# Patient Record
Sex: Male | Born: 1965 | Race: Black or African American | Hispanic: No | Marital: Single | Smoking: Former smoker
Health system: Southern US, Community
[De-identification: ages and names within clinical notes are randomized; demographics above are authoritative.]

## PROBLEM LIST (undated history)

## (undated) DIAGNOSIS — K625 Hemorrhage of anus and rectum: Secondary | ICD-10-CM

## (undated) DIAGNOSIS — E119 Type 2 diabetes mellitus without complications: Secondary | ICD-10-CM

## (undated) DIAGNOSIS — I509 Heart failure, unspecified: Secondary | ICD-10-CM

## (undated) DIAGNOSIS — J9610 Chronic respiratory failure, unspecified whether with hypoxia or hypercapnia: Secondary | ICD-10-CM

## (undated) DIAGNOSIS — I10 Essential (primary) hypertension: Secondary | ICD-10-CM

## (undated) DIAGNOSIS — I2699 Other pulmonary embolism without acute cor pulmonale: Secondary | ICD-10-CM

## (undated) DIAGNOSIS — D649 Anemia, unspecified: Secondary | ICD-10-CM

## (undated) DIAGNOSIS — R0600 Dyspnea, unspecified: Secondary | ICD-10-CM

## (undated) DIAGNOSIS — E669 Obesity, unspecified: Secondary | ICD-10-CM

## (undated) DIAGNOSIS — G4730 Sleep apnea, unspecified: Secondary | ICD-10-CM

## (undated) DIAGNOSIS — I4891 Unspecified atrial fibrillation: Secondary | ICD-10-CM

## (undated) HISTORY — DX: Unspecified atrial fibrillation: I48.91

## (undated) HISTORY — PX: NO PAST SURGERIES: SHX2092

---
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
## 1998-06-04 ENCOUNTER — Emergency Department (HOSPITAL_COMMUNITY): Admission: EM | Admit: 1998-06-04 | Discharge: 1998-06-04 | Payer: Self-pay | Admitting: Emergency Medicine

## 2010-02-02 ENCOUNTER — Emergency Department: Payer: Self-pay | Admitting: Emergency Medicine

## 2010-12-15 ENCOUNTER — Inpatient Hospital Stay: Payer: Self-pay | Admitting: Internal Medicine

## 2019-01-21 ENCOUNTER — Other Ambulatory Visit: Payer: Self-pay

## 2019-01-21 ENCOUNTER — Emergency Department (HOSPITAL_COMMUNITY)

## 2019-01-21 ENCOUNTER — Encounter (HOSPITAL_COMMUNITY): Payer: Self-pay | Admitting: Emergency Medicine

## 2019-01-21 ENCOUNTER — Emergency Department (HOSPITAL_BASED_OUTPATIENT_CLINIC_OR_DEPARTMENT_OTHER)

## 2019-01-21 ENCOUNTER — Inpatient Hospital Stay (HOSPITAL_COMMUNITY)
Admission: EM | Admit: 2019-01-21 | Discharge: 2019-01-24 | DRG: 175 | Disposition: A | Discharge status: Home / Self Care | Attending: Family Medicine | Admitting: Family Medicine

## 2019-01-21 DIAGNOSIS — I5032 Chronic diastolic (congestive) heart failure: Secondary | ICD-10-CM

## 2019-01-21 DIAGNOSIS — M7989 Other specified soft tissue disorders: Secondary | ICD-10-CM

## 2019-01-21 DIAGNOSIS — I110 Hypertensive heart disease with heart failure: Secondary | ICD-10-CM | POA: Diagnosis present

## 2019-01-21 DIAGNOSIS — G4730 Sleep apnea, unspecified: Secondary | ICD-10-CM | POA: Diagnosis present

## 2019-01-21 DIAGNOSIS — E119 Type 2 diabetes mellitus without complications: Secondary | ICD-10-CM | POA: Diagnosis present

## 2019-01-21 DIAGNOSIS — R600 Localized edema: Secondary | ICD-10-CM | POA: Diagnosis not present

## 2019-01-21 DIAGNOSIS — I160 Hypertensive urgency: Secondary | ICD-10-CM | POA: Diagnosis present

## 2019-01-21 DIAGNOSIS — I2699 Other pulmonary embolism without acute cor pulmonale: Principal | ICD-10-CM | POA: Diagnosis present

## 2019-01-21 DIAGNOSIS — I10 Essential (primary) hypertension: Secondary | ICD-10-CM | POA: Diagnosis not present

## 2019-01-21 DIAGNOSIS — E1169 Type 2 diabetes mellitus with other specified complication: Secondary | ICD-10-CM | POA: Diagnosis present

## 2019-01-21 DIAGNOSIS — I5033 Acute on chronic diastolic (congestive) heart failure: Secondary | ICD-10-CM | POA: Diagnosis present

## 2019-01-21 DIAGNOSIS — Z6841 Body Mass Index (BMI) 40.0 and over, adult: Secondary | ICD-10-CM

## 2019-01-21 DIAGNOSIS — Z91128 Patient's intentional underdosing of medication regimen for other reason: Secondary | ICD-10-CM

## 2019-01-21 DIAGNOSIS — M79609 Pain in unspecified limb: Secondary | ICD-10-CM

## 2019-01-21 DIAGNOSIS — E669 Obesity, unspecified: Secondary | ICD-10-CM | POA: Diagnosis present

## 2019-01-21 DIAGNOSIS — R0602 Shortness of breath: Secondary | ICD-10-CM | POA: Diagnosis not present

## 2019-01-21 DIAGNOSIS — Z79899 Other long term (current) drug therapy: Secondary | ICD-10-CM

## 2019-01-21 DIAGNOSIS — R2233 Localized swelling, mass and lump, upper limb, bilateral: Secondary | ICD-10-CM | POA: Diagnosis not present

## 2019-01-21 DIAGNOSIS — T45516A Underdosing of anticoagulants, initial encounter: Secondary | ICD-10-CM | POA: Diagnosis present

## 2019-01-21 DIAGNOSIS — E785 Hyperlipidemia, unspecified: Secondary | ICD-10-CM | POA: Diagnosis present

## 2019-01-21 DIAGNOSIS — Z7984 Long term (current) use of oral hypoglycemic drugs: Secondary | ICD-10-CM

## 2019-01-21 DIAGNOSIS — Z7901 Long term (current) use of anticoagulants: Secondary | ICD-10-CM

## 2019-01-21 DIAGNOSIS — Z20828 Contact with and (suspected) exposure to other viral communicable diseases: Secondary | ICD-10-CM | POA: Diagnosis present

## 2019-01-21 DIAGNOSIS — Z87891 Personal history of nicotine dependence: Secondary | ICD-10-CM

## 2019-01-21 DIAGNOSIS — I480 Paroxysmal atrial fibrillation: Secondary | ICD-10-CM | POA: Diagnosis present

## 2019-01-21 HISTORY — DX: Type 2 diabetes mellitus without complications: E11.9

## 2019-01-21 HISTORY — DX: Obesity, unspecified: E66.9

## 2019-01-21 HISTORY — DX: Essential (primary) hypertension: I10

## 2019-01-21 HISTORY — DX: Sleep apnea, unspecified: G47.30

## 2019-01-21 LAB — CBC WITH DIFFERENTIAL/PLATELET
Abs Immature Granulocytes: 0.03 10*3/uL (ref 0.00–0.07)
Basophils Absolute: 0 10*3/uL (ref 0.0–0.1)
Basophils Relative: 0 %
Eosinophils Absolute: 0.5 10*3/uL (ref 0.0–0.5)
Eosinophils Relative: 7 %
HCT: 43.5 % (ref 39.0–52.0)
Hemoglobin: 12.9 g/dL — ABNORMAL LOW (ref 13.0–17.0)
Immature Granulocytes: 0 %
Lymphocytes Relative: 29 %
Lymphs Abs: 1.9 10*3/uL (ref 0.7–4.0)
MCH: 23.9 pg — ABNORMAL LOW (ref 26.0–34.0)
MCHC: 29.7 g/dL — ABNORMAL LOW (ref 30.0–36.0)
MCV: 80.7 fL (ref 80.0–100.0)
Monocytes Absolute: 0.7 10*3/uL (ref 0.1–1.0)
Monocytes Relative: 10 %
Neutro Abs: 3.6 10*3/uL (ref 1.7–7.7)
Neutrophils Relative %: 54 %
Platelets: 346 10*3/uL (ref 150–400)
RBC: 5.39 MIL/uL (ref 4.22–5.81)
RDW: 15.6 % — ABNORMAL HIGH (ref 11.5–15.5)
WBC: 6.7 10*3/uL (ref 4.0–10.5)
nRBC: 0 % (ref 0.0–0.2)

## 2019-01-21 LAB — COMPREHENSIVE METABOLIC PANEL
ALT: 21 U/L (ref 0–44)
AST: 19 U/L (ref 15–41)
Albumin: 3.4 g/dL — ABNORMAL LOW (ref 3.5–5.0)
Alkaline Phosphatase: 53 U/L (ref 38–126)
Anion gap: 10 (ref 5–15)
BUN: 9 mg/dL (ref 6–20)
CO2: 27 mmol/L (ref 22–32)
Calcium: 9 mg/dL (ref 8.9–10.3)
Chloride: 101 mmol/L (ref 98–111)
Creatinine, Ser: 1.03 mg/dL (ref 0.61–1.24)
GFR calc Af Amer: >60 mL/min (ref >60)
GFR calc non Af Amer: >60 mL/min (ref >60)
Glucose, Bld: 99 mg/dL (ref 70–99)
Potassium: 3.7 mmol/L (ref 3.5–5.1)
Sodium: 138 mmol/L (ref 135–145)
Total Bilirubin: 0.5 mg/dL (ref 0.3–1.2)
Total Protein: 7.3 g/dL (ref 6.5–8.1)

## 2019-01-21 LAB — PROTIME-INR
INR: 1.2 (ref 0.8–1.2)
Prothrombin Time: 15.2 seconds (ref 11.4–15.2)

## 2019-01-21 LAB — TROPONIN I
Troponin I: 0.17 ng/mL (ref <0.03)
Troponin I: 0.18 ng/mL (ref <0.03)

## 2019-01-21 LAB — GLUCOSE, CAPILLARY: Glucose-Capillary: 149 mg/dL — ABNORMAL HIGH (ref 70–99)

## 2019-01-21 LAB — BRAIN NATRIURETIC PEPTIDE: B Natriuretic Peptide: 23.2 pg/mL (ref 0.0–100.0)

## 2019-01-21 LAB — SARS CORONAVIRUS 2 BY RT PCR (HOSPITAL ORDER, PERFORMED IN ~~LOC~~ HOSPITAL LAB): SARS Coronavirus 2: NEGATIVE

## 2019-01-21 MED ORDER — ONDANSETRON HCL 4 MG PO TABS: 4.0000 mg | ORAL_TABLET | Freq: Four times a day (QID) | ORAL | Status: DC | PRN

## 2019-01-21 MED ORDER — IOHEXOL 350 MG/ML SOLN
100.0000 mL | Freq: Once | INTRAVENOUS | Status: AC | PRN
  Administered 2019-01-21: 100 mL via INTRAVENOUS

## 2019-01-21 MED ORDER — HEPARIN BOLUS VIA INFUSION
7500.0000 [IU] | Freq: Once | INTRAVENOUS | Status: AC
  Administered 2019-01-21: 21:00:00 7500 [IU] via INTRAVENOUS
  Filled 2019-01-21: qty 7500

## 2019-01-21 MED ORDER — LISINOPRIL 40 MG PO TABS
40.0000 mg | ORAL_TABLET | Freq: Every day | ORAL | Status: DC
  Administered 2019-01-22 – 2019-01-24 (×3): 40 mg via ORAL
  Filled 2019-01-21 (×3): qty 1

## 2019-01-21 MED ORDER — ATORVASTATIN CALCIUM 10 MG PO TABS
20.0000 mg | ORAL_TABLET | Freq: Every day | ORAL | Status: DC
  Administered 2019-01-22 – 2019-01-23 (×3): 20 mg via ORAL
  Filled 2019-01-21 (×3): qty 2

## 2019-01-21 MED ORDER — FUROSEMIDE 80 MG PO TABS
80.0000 mg | ORAL_TABLET | Freq: Three times a day (TID) | ORAL | Status: DC
  Administered 2019-01-22 – 2019-01-24 (×8): 80 mg via ORAL
  Filled 2019-01-21 (×8): qty 1

## 2019-01-21 MED ORDER — PANTOPRAZOLE SODIUM 40 MG PO TBEC
40.0000 mg | DELAYED_RELEASE_TABLET | Freq: Every day | ORAL | Status: DC
  Administered 2019-01-22 – 2019-01-24 (×3): 40 mg via ORAL
  Filled 2019-01-21 (×3): qty 1

## 2019-01-21 MED ORDER — LABETALOL HCL 5 MG/ML IV SOLN
20.0000 mg | Freq: Once | INTRAVENOUS | Status: AC
  Administered 2019-01-21: 19:00:00 20 mg via INTRAVENOUS
  Filled 2019-01-21: qty 4

## 2019-01-21 MED ORDER — ONDANSETRON HCL 4 MG/2ML IJ SOLN: 4.0000 mg | Freq: Four times a day (QID) | INTRAMUSCULAR | Status: DC | PRN

## 2019-01-21 MED ORDER — ACETAMINOPHEN 650 MG RE SUPP: 650.0000 mg | Freq: Four times a day (QID) | RECTAL | Status: DC | PRN

## 2019-01-21 MED ORDER — HYDRALAZINE HCL 20 MG/ML IJ SOLN
5.0000 mg | INTRAMUSCULAR | Status: DC | PRN
  Administered 2019-01-22 (×2): 5 mg via INTRAVENOUS
  Filled 2019-01-21 (×2): qty 1

## 2019-01-21 MED ORDER — SPIRONOLACTONE 25 MG PO TABS
50.0000 mg | ORAL_TABLET | Freq: Every day | ORAL | Status: DC
  Administered 2019-01-22 – 2019-01-24 (×3): 50 mg via ORAL
  Filled 2019-01-21 (×3): qty 2

## 2019-01-21 MED ORDER — INSULIN ASPART 100 UNIT/ML ~~LOC~~ SOLN
0.0000 [IU] | Freq: Three times a day (TID) | SUBCUTANEOUS | Status: DC
  Administered 2019-01-22: 1 [IU] via SUBCUTANEOUS
  Administered 2019-01-22: 2 [IU] via SUBCUTANEOUS

## 2019-01-21 MED ORDER — HEPARIN (PORCINE) 25000 UT/250ML-% IV SOLN
2150.0000 [IU]/h | INTRAVENOUS | Status: DC
  Administered 2019-01-21: 21:00:00 1950 [IU]/h via INTRAVENOUS
  Administered 2019-01-22: 2150 [IU]/h via INTRAVENOUS
  Filled 2019-01-21 (×2): qty 250

## 2019-01-21 MED ORDER — ACETAMINOPHEN 325 MG PO TABS
650.0000 mg | ORAL_TABLET | Freq: Four times a day (QID) | ORAL | Status: DC | PRN
  Administered 2019-01-22: 650 mg via ORAL
  Filled 2019-01-21: qty 2

## 2019-01-21 MED ORDER — AMLODIPINE BESYLATE 10 MG PO TABS
10.0000 mg | ORAL_TABLET | Freq: Every day | ORAL | Status: DC
  Administered 2019-01-22 – 2019-01-24 (×3): 10 mg via ORAL
  Filled 2019-01-21 (×3): qty 1

## 2019-01-21 MED ORDER — LABETALOL HCL 200 MG PO TABS
200.0000 mg | ORAL_TABLET | Freq: Every day | ORAL | Status: DC
  Administered 2019-01-22 – 2019-01-24 (×3): 200 mg via ORAL
  Filled 2019-01-21 (×3): qty 1

## 2019-01-21 NOTE — ED Triage Notes (Addendum)
Pt in with LLE swelling and pain x 2 wks. States pain worse today, still able to ambulate. Extremity darker than norm per pt, +1 pedal pulse present. Hypertensive - 224/119

## 2019-01-21 NOTE — ED Notes (Signed)
Iv attempted at left ac without success.

## 2019-01-21 NOTE — H&P (Signed)
History and Physical    MEYER ARORA UJW:[id number] DOB: Sep 23, 1965 DOA: 01/21/2019  PCP: System, Pcp Not In  Patient coming from: Home.  Chief Complaint: Shortness of breath.  HPI: Benjamin [name] is a 53 y.o. male with history of morbid obesity, diabetes mellitus type 2, hypertension, hyperlipidemia presents to the ER because of increasing shortness of breath and lower extremity edema ongoing for last 2 weeks.  Patient states he was just out of the prison last month and recently has been taking his medications regularly but over the last 1 week he has not taken.  Shortness of breath increased on exertion denies any associated productive cough fever chills or chest pain.  Has noted increasing swelling in the lower extremity and a gained weight.  ED Course: In the ER EKG shows sinus rhythm CT angiogram of the chest done showed bilateral pulmonary embolism with no strain pattern.  Patient INR was subtherapeutic since patient was not taking his Coumadin for last 1 week.  COVID-19 test was negative.  Patient was started on heparin.  Dopplers of the lower extremity of the left side was negative for DVT.  In addition patient's blood pressures found to be markedly elevated for which patient was given IV hydralazine.  Review of Systems: As per HPI, rest all negative.   Past Medical History:  Diagnosis Date   Diabetes mellitus without complication (HCC)    Hypertension    Obesity    Sleep apnea     History reviewed. No pertinent surgical history.   reports that he has quit smoking. He has never used smokeless tobacco. He reports current alcohol use. He reports previous drug use.  No Known Allergies  History reviewed. No pertinent family history.  Prior to Admission medications   Medication Sig Start Date End Date Taking? Authorizing Provider  amLODipine (NORVASC) 10 MG tablet Take 10 mg by mouth daily. 01/03/19  Yes [provider]  atorvastatin (LIPITOR) 20 MG tablet  Take 20 mg by mouth at bedtime. 01/03/19  Yes [provider]  furosemide (LASIX) 80 MG tablet Take 80 mg by mouth 3 (three) times daily. 01/03/19  Yes [provider]  Glycerin-Hypromellose-PEG 400 (VISINE DRY EYE) 0.2-0.2-1 % SOLN Place 1-2 drops into both eyes 2 (two) times a day.   Yes [provider]  labetalol (NORMODYNE) 200 MG tablet Take 200 mg by mouth daily. 01/03/19  Yes [provider]  lisinopril (ZESTRIL) 40 MG tablet Take 40 mg by mouth daily. 01/03/19  Yes [provider]  metFORMIN (GLUCOPHAGE) 500 MG tablet Take 500 mg by mouth 2 (two) times a day. 01/03/19  Yes [provider]  omeprazole (PRILOSEC) 40 MG capsule Take 40 mg by mouth daily before breakfast. 01/03/19  Yes [provider]  spironolactone (ALDACTONE) 50 MG tablet Take 50 mg by mouth daily. 01/03/19  Yes [provider]  Throat Lozenges (COUGH DROPS MT) 1 drop as needed (for throat dryness).   Yes [provider]  warfarin (COUMADIN) 6 MG tablet Take 6 mg by mouth daily after breakfast. 01/10/19  Yes [provider]  warfarin (COUMADIN) 7.5 MG tablet daily after breakfast. 01/03/19  Yes [provider]    Physical Exam: Vitals:   01/21/19 1845 01/21/19 1930 01/21/19 2100 01/21/19 2130  BP: (!) 186/108 (!) 154/100 (!) 173/121 (!) 174/95  Pulse: 89 92 88 83  Resp: 20 (!) 23 (!) 23 (!) 22  Temp:      TempSrc:  SpO2: 91% 95% 92% (!) 82%  Weight:      Height:          Constitutional: Moderately built and nourished. Vitals:   01/21/19 1845 01/21/19 1930 01/21/19 2100 01/21/19 2130  BP: (!) 186/108 (!) 154/100 (!) 173/121 (!) 174/95  Pulse: 89 92 88 83  Resp: 20 (!) 23 (!) 23 (!) 22  Temp:      TempSrc:      SpO2: 91% 95% 92% (!) 82%  Weight:      Height:       Eyes: Anicteric no pallor. ENMT: No discharge from the ears eyes nose and mouth. Neck: No mass felt.  No neck rigidity. Respiratory: No rhonchi or  crepitations. Cardiovascular: S1-S2 heard. Abdomen: Soft nontender bowel tones present. Musculoskeletal: Bilateral lower extremity edema. Skin: No rash. Neurologic: Alert awake oriented to time place and person.  Moves all extremities. Psychiatric: Appears normal.  Normal affect.   Labs on Admission: I have personally reviewed following labs and imaging studies  CBC: Recent Labs  Lab 01/21/19 1704  WBC 6.7  NEUTROABS 3.6  HGB 12.9*  HCT 43.5  MCV 80.7  PLT 346   Basic Metabolic Panel: Recent Labs  Lab 01/21/19 1704  NA 138  K 3.7  CL 101  CO2 27  GLUCOSE 99  BUN 9  CREATININE 1.03  CALCIUM 9.0   GFR: Estimated Creatinine Clearance: 136 mL/min (by C-G formula based on SCr of 1.03 mg/dL). Liver Function Tests: Recent Labs  Lab 01/21/19 1704  AST 19  ALT 21  ALKPHOS 53  BILITOT 0.5  PROT 7.3  ALBUMIN 3.4*   No results for input(s): LIPASE, AMYLASE in the last 168 hours. No results for input(s): AMMONIA in the last 168 hours. Coagulation Profile: Recent Labs  Lab 01/21/19 1704  INR 1.2   Cardiac Enzymes: Recent Labs  Lab 01/21/19 1704  TROPONINI 0.17*   BNP (last 3 results) No results for input(s): PROBNP in the last 8760 hours. HbA1C: No results for input(s): HGBA1C in the last 72 hours. CBG: No results for input(s): GLUCAP in the last 168 hours. Lipid Profile: No results for input(s): CHOL, HDL, LDLCALC, TRIG, CHOLHDL, LDLDIRECT in the last 72 hours. Thyroid Function Tests: No results for input(s): TSH, T4TOTAL, FREET4, T3FREE, THYROIDAB in the last 72 hours. Anemia Panel: No results for input(s): VITAMINB12, FOLATE, FERRITIN, TIBC, IRON, RETICCTPCT in the last 72 hours. Urine analysis: No results found for: COLORURINE, APPEARANCEUR, LABSPEC, PHURINE, GLUCOSEU, HGBUR, BILIRUBINUR, KETONESUR, PROTEINUR, UROBILINOGEN, NITRITE, LEUKOCYTESUR Sepsis Labs: @LABRCNTIP (procalcitonin:4,lacticidven:4) ) Recent Results (from the past 240 hour(s))    SARS Coronavirus 2 (CEPHEID- Performed in [facility] hospital lab), Hosp Order     Status: None   Collection Time: 01/21/19  8:37 PM  Result Value Ref Range Status   SARS Coronavirus 2 NEGATIVE NEGATIVE Final    Comment: (NOTE) If result is NEGATIVE SARS-CoV-2 target nucleic acids are NOT DETECTED. The SARS-CoV-2 RNA is generally detectable in upper and lower  respiratory specimens during the acute phase of infection. The lowest  concentration of SARS-CoV-2 viral copies this assay can detect is 250  copies / mL. A negative result does not preclude SARS-CoV-2 infection  and should not be used as the sole basis for treatment or other  patient management decisions.  A negative result may occur with  improper specimen collection / handling, submission of specimen other  than nasopharyngeal swab, presence of viral mutation(s) within the  areas targeted by this assay,  and inadequate number of viral copies  (<250 copies / mL). A negative result must be combined with clinical  observations, patient history, and epidemiological information. If result is POSITIVE SARS-CoV-2 target nucleic acids are DETECTED. The SARS-CoV-2 RNA is generally detectable in upper and lower  respiratory specimens dur ing the acute phase of infection.  Positive  results are indicative of active infection with SARS-CoV-2.  Clinical  correlation with patient history and other diagnostic information is  necessary to determine patient infection status.  Positive results do  not rule out bacterial infection or co-infection with other viruses. If result is PRESUMPTIVE POSTIVE SARS-CoV-2 nucleic acids MAY BE PRESENT.   A presumptive positive result was obtained on the submitted specimen  and confirmed on repeat testing.  While 2019 novel coronavirus  (SARS-CoV-2) nucleic acids may be present in the submitted sample  additional confirmatory testing may be necessary for epidemiological  and / or clinical management  purposes  to differentiate between  SARS-CoV-2 and other Sarbecovirus currently known to infect humans.  If clinically indicated additional testing with an alternate test  methodology [phone]) is advised. The SARS-CoV-2 RNA is generally  detectable in upper and lower respiratory sp ecimens during the acute  phase of infection. The expected result is Negative. Fact Sheet for Patients:  BoilerBrush.com.cy Fact Sheet for Healthcare Providers: https://pope.com/ This test is not yet approved or cleared by the [place] FDA and has been authorized for detection and/or diagnosis of SARS-CoV-2 by FDA under an Emergency Use Authorization (EUA).  This EUA will remain in effect (meaning this test can be used) for the duration of the COVID-19 declaration under Section 564(b)(1) of the Act, 21 U.S.C. section 360bbb-3(b)(1), unless the authorization is terminated or revoked sooner. Performed at [facility], [address], [place], [place] [address]      Radiological Exams on Admission: Ct Angio Chest Pe W And/or Wo Contrast  Result Date: 01/21/2019 CLINICAL DATA:  Left leg pain and swelling with chest pain, initial encounter EXAM: CT ANGIOGRAPHY CHEST WITH CONTRAST TECHNIQUE: Multidetector CT imaging of the chest was performed using the standard protocol during bolus administration of intravenous contrast. Multiplanar CT image reconstructions and MIPs were obtained to evaluate the vascular anatomy. CONTRAST:  OMNIPAQUE IOHEXOL 350 MG/ML SOLN COMPARISON:  None. FINDINGS: Cardiovascular: Thoracic aorta shows no significant atherosclerotic calcifications or aneurysmal dilatation. No coronary calcifications are seen. Heart is at the upper limits of normal in size. Pulmonary artery demonstrates a normal branching pattern with multiple filling defects in the lower lobes bilaterally consistent with pulmonary emboli. Mediastinum/Nodes: No hilar or  mediastinal adenopathy is noted. The esophagus as visualized is within normal limits. The thoracic inlet is unremarkable. Lungs/Pleura: Lungs are well aerated bilaterally without focal infiltrate or sizable effusion. No parenchymal nodules are seen. Upper Abdomen: Visualized upper abdomen is within normal limits. Musculoskeletal: Bony structures show degenerative change of the thoracic spine. No acute rib abnormality is seen. Surrounding soft tissue structures are within normal limits. Review of the MIP images confirms the above findings. IMPRESSION: Bilateral lower lobe pulmonary emboli. No definitive right heart strain is seen. Electronically Signed   By: Alcide Clever M.D.   On: 01/21/2019 20:08   Dg Chest Portable 1 View  Result Date: 01/21/2019 CLINICAL DATA:  Shortness of breath EXAM: PORTABLE CHEST 1 VIEW COMPARISON:  12/15/2010 FINDINGS: Cardiac silhouette is significantly enlarged. There is no pneumothorax. No large pleural effusion. Segmental atelectasis is noted at the lung bases. There is some  developing volume overload. IMPRESSION: Cardiomegaly with findings concerning for developing volume overload/pulmonary edema. Electronically Signed   By: Katherine Mantle M.D.   On: 01/21/2019 17:02   Vas [place] Lower Extremity Venous (dvt) (only [facility])  Result Date: 01/21/2019  Lower Venous Study Indications: Pain.  Limitations: Body habitus and Very limited visualization due to depth of vessels/ body habitus. Performing Technologist: Jeb Levering RDMS, RVT  Examination Guidelines: A complete evaluation includes B-mode imaging, spectral Doppler, color Doppler, and power Doppler as needed of all accessible portions of each vessel. Bilateral testing is considered an integral part of a complete examination. Limited examinations for reoccurring indications may be performed as noted.  +---------+---------------+---------+-----------+----------+--------------+  LEFT       Compressibility Phasicity Spontaneity Properties Summary         +---------+---------------+---------+-----------+----------+--------------+  CFV                                                        Not visualized  +---------+---------------+---------+-----------+----------+--------------+  SFJ                                                        Not visualized  +---------+---------------+---------+-----------+----------+--------------+  FV Prox                                                    Not visualized  +---------+---------------+---------+-----------+----------+--------------+  FV Mid                                                     Not visualized  +---------+---------------+---------+-----------+----------+--------------+  FV Distal                                                  Not visualized  +---------+---------------+---------+-----------+----------+--------------+  POP       Full            Yes       Yes                                    +---------+---------------+---------+-----------+----------+--------------+  PTV       Full                                                             +---------+---------------+---------+-----------+----------+--------------+  PERO  Not visualized  +---------+---------------+---------+-----------+----------+--------------+     Summary: Left: Very limited, mostly inconclusive study of the left lower extremity. No DVT noted in areas visualized.  *See table(s) above for measurements and observations.    Preliminary     EKG: Independently reviewed.  Normal sinus rhythm.  Assessment/Plan Principal Problem:   Acute pulmonary embolism (HCC) Active Problems:   Hypertensive urgency   Diabetes mellitus type 2 in obese (HCC)    1. Acute bilateral pulmonary embolism with no strain pattern -patient states he has not ambulatory as usual for the last 2 to 3 weeks.  Patient is placed on heparin.  He  usually takes Coumadin for his A. fib.  Has been subtherapeutic.  When the hospital pharmacy to dose.  Check cardiac markers 2D echo. 2. Hypertensive urgency likely contributing to patient's symptoms for which I have placed patient on PRN IV hydralazine continue labetalol lisinopril and amlodipine. 3. Patient is on large dose of Lasix likely could be from diastolic CHF follow 2D echo results.  Continue Lasix dose.  Follow intake output.  Continue spironolactone. 4. Diabetes mellitus type 2 we will keep patient on sliding scale coverage. 5. History of sleep apnea per the chart. 6. Hyperlipidemia on statin.   DVT prophylaxis: Heparin. Code Status: Full code. Family Communication: Discussed with patient. Disposition Plan: Home. Consults called: None. Admission status: Observation.   Eduard Clos MD [facility] Pager [phone].  If 7PM-7AM, please contact night-coverage [email or web address] Password [email or web address]  01/21/2019, 10:29 PM

## 2019-01-21 NOTE — ED Notes (Signed)
MD notified of elevated troponin 0.17

## 2019-01-21 NOTE — ED Notes (Signed)
ED TO INPATIENT HANDOFF REPORT  ED Nurse Name and Phone #: Lupe Carney [phone]  S Name/Age/Gender Benjamin [name] 53 y.o. male Room/Bed: 023C/023C  Code Status   Code Status: Full Code  Home/SNF/Other Home Patient oriented to: self, place, time and situation Is this baseline? Yes   Triage Complete: Triage complete  Chief Complaint left side pain  Triage Note Pt in with LLE swelling and pain x 2 wks. States pain worse today, still able to ambulate. Extremity darker than norm per pt, +1 pedal pulse present. Hypertensive - 224/119   Allergies No Known Allergies  Level of Care/Admitting Diagnosis ED Disposition    ED Disposition Condition Comment   Admit  Hospital Area: [facility] [[id number]]  Level of Care: Telemetry Medical [104]  I expect the patient will be discharged within 24 hours: No (not a candidate for 5C-Observation unit)  Covid Evaluation: N/A  Diagnosis: Acute pulmonary embolism [facility]) [[id number]]  Admitting Physician: Eduard Clos [phone]  Attending Physician: Eduard Clos Florian.Pax  PT Class (Do Not Modify): Observation [104]  PT Acc Code (Do Not Modify): Observation [10022]       B Medical/Surgery History Past Medical History:  Diagnosis Date  . Diabetes mellitus without complication (HCC)   . Hypertension   . Obesity   . Sleep apnea    History reviewed. No pertinent surgical history.   A IV Location/Drains/Wounds Patient Lines/Drains/Airways Status   Active Line/Drains/Airways    Name:   Placement date:   Placement time:   Site:   Days:   Peripheral IV 01/21/19 Right Hand   01/21/19    1719    Hand   less than 1   Peripheral IV 01/21/19 Right Antecubital   01/21/19    1934    Antecubital   less than 1          Intake/Output Last 24 hours No intake or output data in the 24 hours ending 01/21/19 2254  Labs/Imaging Results for orders placed or performed during the hospital encounter of 01/21/19 (from the past 48  hour(s))  CBC with Differential     Status: Abnormal   Collection Time: 01/21/19  5:04 PM  Result Value Ref Range   WBC 6.7 4.0 - 10.5 K/uL   RBC 5.39 4.22 - 5.81 MIL/uL   Hemoglobin 12.9 (L) 13.0 - 17.0 g/dL   HCT 47.8 29.5 - 62.1 %   MCV 80.7 80.0 - 100.0 fL   MCH 23.9 (L) 26.0 - 34.0 pg   MCHC 29.7 (L) 30.0 - 36.0 g/dL   RDW 30.8 (H) 65.7 - 84.6 %   Platelets 346 150 - 400 K/uL   nRBC 0.0 0.0 - 0.2 %   Neutrophils Relative % 54 %   Neutro Abs 3.6 1.7 - 7.7 K/uL   Lymphocytes Relative 29 %   Lymphs Abs 1.9 0.7 - 4.0 K/uL   Monocytes Relative 10 %   Monocytes Absolute 0.7 0.1 - 1.0 K/uL   Eosinophils Relative 7 %   Eosinophils Absolute 0.5 0.0 - 0.5 K/uL   Basophils Relative 0 %   Basophils Absolute 0.0 0.0 - 0.1 K/uL   Immature Granulocytes 0 %   Abs Immature Granulocytes 0.03 0.00 - 0.07 K/uL    Comment: Performed at [facility], [address], [place], [place] [address]  Comprehensive metabolic panel     Status: Abnormal   Collection Time: 01/21/19  5:04 PM  Result Value Ref Range   Sodium  138 135 - 145 mmol/L   Potassium 3.7 3.5 - 5.1 mmol/L   Chloride 101 98 - 111 mmol/L   CO2 27 22 - 32 mmol/L   Glucose, Bld 99 70 - 99 mg/dL   BUN 9 6 - 20 mg/dL   Creatinine, Ser 5.91 0.61 - 1.24 mg/dL   Calcium 9.0 8.9 - 63.8 mg/dL   Total Protein 7.3 6.5 - 8.1 g/dL   Albumin 3.4 (L) 3.5 - 5.0 g/dL   AST 19 15 - 41 U/L   ALT 21 0 - 44 U/L   Alkaline Phosphatase 53 38 - 126 U/L   Total Bilirubin 0.5 0.3 - 1.2 mg/dL   GFR calc non Af Amer >60 >60 mL/min   GFR calc Af Amer >60 >60 mL/min   Anion gap 10 5 - 15    Comment: Performed at [facility], [address], [place], [place] [address]  Protime-INR     Status: None   Collection Time: 01/21/19  5:04 PM  Result Value Ref Range   Prothrombin Time 15.2 11.4 - 15.2 seconds   INR 1.2 0.8 - 1.2    Comment: (NOTE) INR goal varies based on device and disease states. Performed at [facility], [address], [place], [place] [address]   Troponin I - ONCE - STAT     Status: Abnormal   Collection Time: 01/21/19  5:04 PM  Result Value Ref Range   Troponin I 0.17 (HH) <0.03 ng/mL    Comment: CRITICAL RESULT CALLED TO, READ BACK BY AND VERIFIED WITH: M.GAGE RN 1806 01/21/2019 MCCORMICK K Performed at [facility], [address], [place], [place] [address]   Brain natriuretic peptide     Status: None   Collection Time: 01/21/19  5:04 PM  Result Value Ref Range   B Natriuretic Peptide 23.2 0.0 - 100.0 pg/mL    Comment: Performed at [facility], [address], [place], [place] [address]  SARS Coronavirus 2 (CEPHEID- Performed in [facility] hospital lab), Hosp Order     Status: None   Collection Time: 01/21/19  8:37 PM  Result Value Ref Range   SARS Coronavirus 2 NEGATIVE NEGATIVE    Comment: (NOTE) If result is NEGATIVE SARS-CoV-2 target nucleic acids are NOT DETECTED. The SARS-CoV-2 RNA is generally detectable in upper and lower  respiratory specimens during the acute phase of infection. The lowest  concentration of SARS-CoV-2 viral copies this assay can detect is 250  copies / mL. A negative result does not preclude SARS-CoV-2 infection  and should not be used as the sole basis for treatment or other  patient management decisions.  A negative result may occur with  improper specimen collection / handling, submission of specimen other  than nasopharyngeal swab, presence of viral mutation(s) within the  areas targeted by this assay, and inadequate number of viral copies  (<250 copies / mL). A negative result must be combined with clinical  observations, patient history, and epidemiological information. If result is POSITIVE SARS-CoV-2 target nucleic acids are DETECTED. The SARS-CoV-2 RNA is generally detectable in upper and lower  respiratory specimens dur ing the acute phase of infection.  Positive  results are indicative of active infection with SARS-CoV-2.  Clinical   correlation with patient history and other diagnostic information is  necessary to determine patient infection status.  Positive results do  not rule out bacterial infection or co-infection with other viruses. If result is PRESUMPTIVE  POSTIVE SARS-CoV-2 nucleic acids MAY BE PRESENT.   A presumptive positive result was obtained on the submitted specimen  and confirmed on repeat testing.  While 2019 novel coronavirus  (SARS-CoV-2) nucleic acids may be present in the submitted sample  additional confirmatory testing may be necessary for epidemiological  and / or clinical management purposes  to differentiate between  SARS-CoV-2 and other Sarbecovirus currently known to infect humans.  If clinically indicated additional testing with an alternate test  methodology [phone]) is advised. The SARS-CoV-2 RNA is generally  detectable in upper and lower respiratory sp ecimens during the acute  phase of infection. The expected result is Negative. Fact Sheet for Patients:  BoilerBrush.com.cy Fact Sheet for Healthcare Providers: https://pope.com/ This test is not yet approved or cleared by the [place] FDA and has been authorized for detection and/or diagnosis of SARS-CoV-2 by FDA under an Emergency Use Authorization (EUA).  This EUA will remain in effect (meaning this test can be used) for the duration of the COVID-19 declaration under Section 564(b)(1) of the Act, 21 U.S.C. section 360bbb-3(b)(1), unless the authorization is terminated or revoked sooner. Performed at [facility], [address], [place], [place] [address]    Ct Angio Chest Pe W And/or Wo Contrast  Result Date: 01/21/2019 CLINICAL DATA:  Left leg pain and swelling with chest pain, initial encounter EXAM: CT ANGIOGRAPHY CHEST WITH CONTRAST TECHNIQUE: Multidetector CT imaging of the chest was performed using the standard protocol during bolus administration of intravenous  contrast. Multiplanar CT image reconstructions and MIPs were obtained to evaluate the vascular anatomy. CONTRAST:  OMNIPAQUE IOHEXOL 350 MG/ML SOLN COMPARISON:  None. FINDINGS: Cardiovascular: Thoracic aorta shows no significant atherosclerotic calcifications or aneurysmal dilatation. No coronary calcifications are seen. Heart is at the upper limits of normal in size. Pulmonary artery demonstrates a normal branching pattern with multiple filling defects in the lower lobes bilaterally consistent with pulmonary emboli. Mediastinum/Nodes: No hilar or mediastinal adenopathy is noted. The esophagus as visualized is within normal limits. The thoracic inlet is unremarkable. Lungs/Pleura: Lungs are well aerated bilaterally without focal infiltrate or sizable effusion. No parenchymal nodules are seen. Upper Abdomen: Visualized upper abdomen is within normal limits. Musculoskeletal: Bony structures show degenerative change of the thoracic spine. No acute rib abnormality is seen. Surrounding soft tissue structures are within normal limits. Review of the MIP images confirms the above findings. IMPRESSION: Bilateral lower lobe pulmonary emboli. No definitive right heart strain is seen. Electronically Signed   By: Alcide Clever M.D.   On: 01/21/2019 20:08   Dg Chest Portable 1 View  Result Date: 01/21/2019 CLINICAL DATA:  Shortness of breath EXAM: PORTABLE CHEST 1 VIEW COMPARISON:  12/15/2010 FINDINGS: Cardiac silhouette is significantly enlarged. There is no pneumothorax. No large pleural effusion. Segmental atelectasis is noted at the lung bases. There is some developing volume overload. IMPRESSION: Cardiomegaly with findings concerning for developing volume overload/pulmonary edema. Electronically Signed   By: Katherine Mantle M.D.   On: 01/21/2019 17:02   Vas [place] Lower Extremity Venous (dvt) (only Mc & W[facility])  Result Date: 01/21/2019  Lower Venous Study Indications: Pain.  Limitations: Body habitus and Very limited  visualization due to depth of vessels/ body habitus. Performing Technologist: Jeb Levering RDMS, RVT  Examination Guidelines: A complete evaluation includes B-mode imaging, spectral Doppler, color Doppler, and power Doppler as needed of all accessible portions of each vessel. Bilateral testing is considered an integral part of a complete examination. Limited examinations for reoccurring indications  may be performed as noted.  +---------+---------------+---------+-----------+----------+--------------+ LEFT     CompressibilityPhasicitySpontaneityPropertiesSummary        +---------+---------------+---------+-----------+----------+--------------+ CFV                                                   Not visualized +---------+---------------+---------+-----------+----------+--------------+ SFJ                                                   Not visualized +---------+---------------+---------+-----------+----------+--------------+ FV Prox                                               Not visualized +---------+---------------+---------+-----------+----------+--------------+ FV Mid                                                Not visualized +---------+---------------+---------+-----------+----------+--------------+ FV Distal                                             Not visualized +---------+---------------+---------+-----------+----------+--------------+ POP      Full           Yes      Yes                                 +---------+---------------+---------+-----------+----------+--------------+ PTV      Full                                                        +---------+---------------+---------+-----------+----------+--------------+ PERO                                                  Not visualized +---------+---------------+---------+-----------+----------+--------------+     Summary: Left: Very limited, mostly inconclusive study of the left lower  extremity. No DVT noted in areas visualized.  *See table(s) above for measurements and observations.    Preliminary     Pending Labs Unresulted Labs (From admission, onward)    Start     Ordered   01/23/19 0500  Heparin level (unfractionated)  Daily,   R     01/21/19 2018   01/22/19 0500  CBC  Daily,   R     01/21/19 2018   01/22/19 0500  Basic metabolic panel  Tomorrow morning,   R     01/21/19 2228   01/22/19 0500  Hepatic function panel  Tomorrow morning,   R     01/21/19 2228   01/22/19 0500  CBC WITH DIFFERENTIAL  Tomorrow morning,   R  01/21/19 2228   01/22/19 0300  Heparin level (unfractionated)  Once-Timed,   R     01/21/19 2018   01/21/19 2228  Troponin I - Now Then Q6H  Now then every 6 hours,   R     01/21/19 2228   01/21/19 2227  HIV antibody (Routine Testing)  Once,   R     01/21/19 2228          Vitals/Pain Today's Vitals   01/21/19 1930 01/21/19 2100 01/21/19 2130 01/21/19 2230  BP: (!) 154/100 (!) 173/121 (!) 174/95 (!) 134/114  Pulse: 92 88 83 86  Resp: (!) 23 (!) 23 (!) 22 20  Temp:      TempSrc:      SpO2: 95% 92% (!) 82% 92%  Weight:      Height:      PainSc:        Isolation Precautions No active isolations  Medications Medications  heparin ADULT infusion 100 units/mL (25000 units/241mL sodium chloride 0.45%) (1,950 Units/hr Intravenous Transfusing/Transfer 01/21/19 2238)  amLODipine (NORVASC) tablet 10 mg (has no administration in time range)  atorvastatin (LIPITOR) tablet 20 mg (has no administration in time range)  furosemide (LASIX) tablet 80 mg (has no administration in time range)  labetalol (NORMODYNE) tablet 200 mg (has no administration in time range)  lisinopril (ZESTRIL) tablet 40 mg (has no administration in time range)  spironolactone (ALDACTONE) tablet 50 mg (has no administration in time range)  pantoprazole (PROTONIX) EC tablet 40 mg (has no administration in time range)  acetaminophen (TYLENOL) tablet 650 mg (has no  administration in time range)    Or  acetaminophen (TYLENOL) suppository 650 mg (has no administration in time range)  ondansetron (ZOFRAN) tablet 4 mg (has no administration in time range)    Or  ondansetron (ZOFRAN) injection 4 mg (has no administration in time range)  hydrALAZINE (APRESOLINE) injection 5 mg (has no administration in time range)  insulin aspart (novoLOG) injection 0-9 Units (has no administration in time range)  labetalol (NORMODYNE) injection 20 mg (20 mg Intravenous Given 01/21/19 1928)  iohexol (OMNIPAQUE) 350 MG/ML injection 100 mL (100 mLs Intravenous Contrast Given 01/21/19 1946)  heparin bolus via infusion 7,500 Units (7,500 Units Intravenous Bolus from Bag 01/21/19 2033)    Mobility walks Low fall risk   Focused Assessments Cardiac Assessment Handoff:  Cardiac Rhythm: Normal sinus rhythm Lab Results  Component Value Date   TROPONINI 0.17 (HH) 01/21/2019   No results found for: DDIMER Does the Patient currently have chest pain? No     R Recommendations: See Admitting Provider Note  Report given to: Amy  Additional Notes:

## 2019-01-21 NOTE — Progress Notes (Signed)
ANTICOAGULATION CONSULT NOTE - Initial Consult  Pharmacy Consult for heparin Indication: pulmonary embolus  No Known Allergies  Patient Measurements: Height: 5\' 11"  (180.3 cm) Weight: (!) 390 lb (176.9 kg) IBW/kg (Calculated) : 75.3 Heparin Dosing Weight: 113 kg  Vital Signs: Temp: 98.4 F (36.9 C) (05/04 1628) Temp Source: Oral (05/04 1628) BP: 154/100 (05/04 1930) Pulse Rate: 92 (05/04 1930)  Labs: Recent Labs    01/21/19 1704  HGB 12.9*  HCT 43.5  PLT 346  LABPROT 15.2  INR 1.2  CREATININE 1.03  TROPONINI 0.17*    Estimated Creatinine Clearance: 136 mL/min (by C-G formula based on SCr of 1.03 mg/dL).  Assessment: CC/HPI: 53 yo m presenting w LLE swelling x 2 weeks  PMH: HTN DM hx of PE on warfarin, CHF  Anticoag: warfarin pta for hx of PE (last dose 5/4??) Admit INR 1.2  Pulm: BL PE on CTA, no RHS  Heme/Onc: H&H 12.9/43.5, Plt 346  Goal of Therapy:  Heparin level 0.3-0.7 units/ml Monitor platelets by anticoagulation protocol: Yes   Plan:  Heparin bolus 7500 units x 1 Heparin gtt 1900 units/hr Initial HL 0300 Daily HL CBC FU plans for oral [facility]  Isaac Bliss, PharmD, BCPS, BCCCP Clinical Pharmacist [phone]  Please check AMION for all [facility] Pharmacy numbers  01/21/2019 8:21 PM   Bertram Millard 01/21/2019,8:20 PM

## 2019-01-21 NOTE — ED Provider Notes (Signed)
[facility] EMERGENCY DEPARTMENT Provider Note   CSN: [id number] Arrival date & time: 01/21/19  1339    History   Chief Complaint Chief Complaint  Patient presents with  . Leg Swelling  . L leg pain  . Hypertension    HPI Benjamin [name] is a 53 y.o. male.     HPI 53 year old male with a history of diabetes, HTN, obesity, sleep apnea, pulmonary embolism on warfarin, and CHF presents with lower extremity swelling.  Patient reports that he had worsening swelling over the past 2 months.  Swelling is worse in the left lower extremity.  No erythema or fevers.  He reports compliance with his warfarin.  He has had associated shortness of breath with ambulation and exertion.  Denies chest pain.  Of note, patient was recently released from prison in March and has not had follow-up with a doctor since that time.  He has been taking 80 mg of Lasix in the morning and 40 mg in the evening.  He does not think this is improving his symptoms.  No falls or trauma to the leg.  Past Medical History:  Diagnosis Date  . Diabetes mellitus without complication (HCC)   . Hypertension   . Obesity   . Sleep apnea     Patient Active Problem List   Diagnosis Date Noted  . Acute pulmonary embolism (HCC) 01/21/2019  . Hypertensive urgency 01/21/2019  . Diabetes mellitus type 2 in obese (HCC) 01/21/2019    History reviewed. No pertinent surgical history.      Home Medications    Prior to Admission medications   Medication Sig Start Date End Date Taking? Authorizing Provider  amLODipine (NORVASC) 10 MG tablet Take 10 mg by mouth daily. 01/03/19  Yes [provider]  atorvastatin (LIPITOR) 20 MG tablet Take 20 mg by mouth at bedtime. 01/03/19  Yes [provider]  furosemide (LASIX) 80 MG tablet Take 80 mg by mouth 3 (three) times daily. 01/03/19  Yes [provider]  Glycerin-Hypromellose-PEG 400 (VISINE DRY EYE) 0.2-0.2-1 % SOLN Place 1-2 drops into  both eyes 2 (two) times a day.   Yes [provider]  labetalol (NORMODYNE) 200 MG tablet Take 200 mg by mouth daily. 01/03/19  Yes [provider]  lisinopril (ZESTRIL) 40 MG tablet Take 40 mg by mouth daily. 01/03/19  Yes [provider]  metFORMIN (GLUCOPHAGE) 500 MG tablet Take 500 mg by mouth 2 (two) times a day. 01/03/19  Yes [provider]  omeprazole (PRILOSEC) 40 MG capsule Take 40 mg by mouth daily before breakfast. 01/03/19  Yes [provider]  spironolactone (ALDACTONE) 50 MG tablet Take 50 mg by mouth daily. 01/03/19  Yes [provider]  Throat Lozenges (COUGH DROPS MT) 1 drop as needed (for throat dryness).   Yes [provider]  warfarin (COUMADIN) 6 MG tablet Take 6 mg by mouth daily after breakfast. 01/10/19  Yes [provider]  warfarin (COUMADIN) 7.5 MG tablet daily after breakfast. 01/03/19  Yes [provider]    Family History History reviewed. No pertinent family history.  Social History Social History   Tobacco Use  . Smoking status: Former Games developer  . Smokeless tobacco: Never Used  Substance Use Topics  . Alcohol use: Yes  . Drug use: Not Currently     Allergies   Patient has no known allergies.   Review of Systems Review of Systems  Constitutional: Negative for chills and fever.  HENT: Negative  for ear pain and sore throat.   Eyes: Negative for pain and visual disturbance.  Respiratory: Positive for shortness of breath. Negative for cough.   Cardiovascular: Positive for leg swelling. Negative for chest pain and palpitations.  Gastrointestinal: Negative for abdominal pain and vomiting.  Genitourinary: Negative for dysuria and hematuria.  Musculoskeletal: Negative for arthralgias and back pain.  Skin: Negative for color change and rash.  Neurological: Negative for seizures and syncope.  All other systems reviewed and are negative.    Physical Exam Updated Vital Signs BP  (!) 171/133 (BP Location: Left Arm)   Pulse (!) 106   Temp 98 F (36.7 C)   Resp 19   Ht 5\' 11"  (1.803 m)   Wt (!) 191 kg   SpO2 93%   BMI 58.72 kg/m   Physical Exam Vitals signs and nursing note reviewed.  Constitutional:      Appearance: He is well-developed.  HENT:     Head: Normocephalic and atraumatic.  Eyes:     Conjunctiva/sclera: Conjunctivae normal.  Neck:     Musculoskeletal: Neck supple.  Cardiovascular:     Rate and Rhythm: Normal rate and regular rhythm.     Heart sounds: No murmur.  Pulmonary:     Effort: Pulmonary effort is normal. No respiratory distress.     Breath sounds: Normal breath sounds.  Abdominal:     Palpations: Abdomen is soft.     Tenderness: There is no abdominal tenderness.  Musculoskeletal:     Comments: 2+ pitting edema in bilateral lower extremities Edema does appear to be worse in the left calf compared to right, no erythema, no wounds 1+ DP pulse Normal motor and sensory function of the extremity  Skin:    General: Skin is warm and dry.  Neurological:     General: No focal deficit present.     Mental Status: He is alert and oriented to person, place, and time.      ED Treatments / Results  Labs (all labs ordered are listed, but only abnormal results are displayed) Labs Reviewed  CBC WITH DIFFERENTIAL/PLATELET - Abnormal; Notable for the following components:      Result Value   Hemoglobin 12.9 (*)    MCH 23.9 (*)    MCHC 29.7 (*)    RDW 15.6 (*)    All other components within normal limits  COMPREHENSIVE METABOLIC PANEL - Abnormal; Notable for the following components:   Albumin 3.4 (*)    All other components within normal limits  TROPONIN I - Abnormal; Notable for the following components:   Troponin I 0.17 (*)    All other components within normal limits  HEPARIN LEVEL (UNFRACTIONATED) - Abnormal; Notable for the following components:   Heparin Unfractionated 0.23 (*)    All other components within normal limits   BASIC METABOLIC PANEL - Abnormal; Notable for the following components:   Glucose, Bld 147 (*)    All other components within normal limits  CBC WITH DIFFERENTIAL/PLATELET - Abnormal; Notable for the following components:   MCH 24.4 (*)    All other components within normal limits  TROPONIN I - Abnormal; Notable for the following components:   Troponin I 0.18 (*)    All other components within normal limits  TROPONIN I - Abnormal; Notable for the following components:   Troponin I 0.19 (*)    All other components within normal limits  TROPONIN I - Abnormal; Notable for the following components:   Troponin I 0.16 (*)  All other components within normal limits  GLUCOSE, CAPILLARY - Abnormal; Notable for the following components:   Glucose-Capillary 149 (*)    All other components within normal limits  HEPARIN LEVEL (UNFRACTIONATED) - Abnormal; Notable for the following components:   Heparin Unfractionated 0.20 (*)    All other components within normal limits  GLUCOSE, CAPILLARY - Abnormal; Notable for the following components:   Glucose-Capillary 122 (*)    All other components within normal limits  GLUCOSE, CAPILLARY - Abnormal; Notable for the following components:   Glucose-Capillary 175 (*)    All other components within normal limits  GLUCOSE, CAPILLARY - Abnormal; Notable for the following components:   Glucose-Capillary 141 (*)    All other components within normal limits  SARS CORONAVIRUS 2 (HOSPITAL ORDER, PERFORMED IN [facility])  PROTIME-INR  BRAIN NATRIURETIC PEPTIDE  HEPATIC FUNCTION PANEL  CBC  HIV ANTIBODY (ROUTINE TESTING W REFLEX)    EKG EKG Interpretation  Date/Time:  Monday Jan 21 2019 16:29:50 EDT Ventricular Rate:  94 PR Interval:    QRS Duration: 98 QT Interval:  353 QTC Calculation: 442 R Axis:   78 Text Interpretation:  Sinus rhythm Borderline T abnormalities, lateral leads Minimal ST elevation, anterior leads No significant  change since last tracing Confirmed by Benjiman Core [phone]) on 01/21/2019 6:35:14 PM   Radiology Ct Angio Chest Pe W And/or Wo Contrast  Result Date: 01/21/2019 CLINICAL DATA:  Left leg pain and swelling with chest pain, initial encounter EXAM: CT ANGIOGRAPHY CHEST WITH CONTRAST TECHNIQUE: Multidetector CT imaging of the chest was performed using the standard protocol during bolus administration of intravenous contrast. Multiplanar CT image reconstructions and MIPs were obtained to evaluate the vascular anatomy. CONTRAST:  OMNIPAQUE IOHEXOL 350 MG/ML SOLN COMPARISON:  None. FINDINGS: Cardiovascular: Thoracic aorta shows no significant atherosclerotic calcifications or aneurysmal dilatation. No coronary calcifications are seen. Heart is at the upper limits of normal in size. Pulmonary artery demonstrates a normal branching pattern with multiple filling defects in the lower lobes bilaterally consistent with pulmonary emboli. Mediastinum/Nodes: No hilar or mediastinal adenopathy is noted. The esophagus as visualized is within normal limits. The thoracic inlet is unremarkable. Lungs/Pleura: Lungs are well aerated bilaterally without focal infiltrate or sizable effusion. No parenchymal nodules are seen. Upper Abdomen: Visualized upper abdomen is within normal limits. Musculoskeletal: Bony structures show degenerative change of the thoracic spine. No acute rib abnormality is seen. Surrounding soft tissue structures are within normal limits. Review of the MIP images confirms the above findings. IMPRESSION: Bilateral lower lobe pulmonary emboli. No definitive right heart strain is seen. Electronically Signed   By: Alcide Clever M.D.   On: 01/21/2019 20:08   Dg Chest Portable 1 View  Result Date: 01/21/2019 CLINICAL DATA:  Shortness of breath EXAM: PORTABLE CHEST 1 VIEW COMPARISON:  12/15/2010 FINDINGS: Cardiac silhouette is significantly enlarged. There is no pneumothorax. No large pleural effusion.  Segmental atelectasis is noted at the lung bases. There is some developing volume overload. IMPRESSION: Cardiomegaly with findings concerning for developing volume overload/pulmonary edema. Electronically Signed   By: Katherine Mantle M.D.   On: 01/21/2019 17:02   Vas [place] Lower Extremity Venous (dvt) (only [facility])  Result Date: 01/22/2019  Lower Venous Study Indications: Pain.  Limitations: Body habitus and Very limited visualization due to depth of vessels/ body habitus. Performing Technologist: Jeb Levering RDMS, RVT  Examination Guidelines: A complete evaluation includes B-mode imaging, spectral Doppler, color Doppler, and power Doppler as needed of  all accessible portions of each vessel. Bilateral testing is considered an integral part of a complete examination. Limited examinations for reoccurring indications may be performed as noted.  +---------+---------------+---------+-----------+----------+--------------+ LEFT     CompressibilityPhasicitySpontaneityPropertiesSummary        +---------+---------------+---------+-----------+----------+--------------+ CFV                                                   Not visualized +---------+---------------+---------+-----------+----------+--------------+ SFJ                                                   Not visualized +---------+---------------+---------+-----------+----------+--------------+ FV Prox                                               Not visualized +---------+---------------+---------+-----------+----------+--------------+ FV Mid                                                Not visualized +---------+---------------+---------+-----------+----------+--------------+ FV Distal                                             Not visualized +---------+---------------+---------+-----------+----------+--------------+ POP      Full           Yes      Yes                                  +---------+---------------+---------+-----------+----------+--------------+ PTV      Full                                                        +---------+---------------+---------+-----------+----------+--------------+ PERO                                                  Not visualized +---------+---------------+---------+-----------+----------+--------------+     Summary: Left: Very limited, mostly inconclusive study of the left lower extremity. No DVT noted in areas visualized.  *See table(s) above for measurements and observations. Electronically signed by Fabienne Brunsharles Fields MD on 01/22/2019 at 2:36:23 AM.    Final     Procedures Procedures (including critical care time)  Medications Ordered in ED Medications  heparin ADULT infusion 100 units/mL (25000 units/22950mL sodium chloride 0.45%) (2,150 Units/hr Intravenous Handoff 01/22/19 1200)  amLODipine (NORVASC) tablet 10 mg (10 mg Oral Given 01/22/19 0850)  atorvastatin (LIPITOR) tablet 20 mg (20 mg Oral Given 01/22/19 0016)  furosemide (LASIX) tablet 80 mg (80 mg Oral Given 01/22/19 0850)  labetalol (NORMODYNE) tablet 200 mg (200 mg Oral  Given 01/22/19 0849)  lisinopril (ZESTRIL) tablet 40 mg (40 mg Oral Given 01/22/19 0849)  spironolactone (ALDACTONE) tablet 50 mg (50 mg Oral Given 01/22/19 0850)  pantoprazole (PROTONIX) EC tablet 40 mg (40 mg Oral Given 01/22/19 0850)  acetaminophen (TYLENOL) tablet 650 mg (650 mg Oral Given 01/22/19 0906)    Or  acetaminophen (TYLENOL) suppository 650 mg ( Rectal See Alternative 01/22/19 0906)  ondansetron (ZOFRAN) tablet 4 mg (has no administration in time range)    Or  ondansetron (ZOFRAN) injection 4 mg (has no administration in time range)  insulin aspart (novoLOG) injection 0-9 Units (2 Units Subcutaneous Given 01/22/19 0853)  warfarin (COUMADIN) tablet 15 mg (has no administration in time range)  Warfarin - Pharmacist Dosing Inpatient (has no administration in time range)  hydrALAZINE (APRESOLINE) injection  10 mg (has no administration in time range)  labetalol (NORMODYNE) injection 20 mg (20 mg Intravenous Given 01/21/19 1928)  iohexol (OMNIPAQUE) 350 MG/ML injection 100 mL (100 mLs Intravenous Contrast Given 01/21/19 1946)  heparin bolus via infusion 7,500 Units (7,500 Units Intravenous Bolus from Bag 01/21/19 2033)  perflutren lipid microspheres (DEFINITY) IV suspension (4 mLs Intravenous Given 01/22/19 0955)     Initial Impression / Assessment and Plan / ED Course  I have reviewed the triage vital signs and the nursing notes.  Pertinent labs & imaging results that were available during my care of the patient were reviewed by me and considered in my medical decision making (see chart for details).  53 year old male with a history of diabetes, HTN, obesity, sleep apnea, pulmonary embolism on warfarin, and CHF presents with lower extremity swelling.  Patient hypertensive on arrival.  Not tachycardic.  Afebrile.  Exam, patient is morbidly obese with bilateral lower extremity pitting edema worse in the left extremity.  Differential includes PE, DVT, pneumonia, CHF exacerbation.  No fevers or erythema.  No wounds on the leg to suggest cellulitis or abscess.  EKG normal sinus rhythm.  T wave inversions in lead II, lead III, aVF, V5 and V6.  Chest x-ray does show cardiomegaly with findings concerning for developing volume overload/pulmonary edema.  20 mg of IV labetalol given to control blood pressure.  CBC shows no leukocytosis.  Hemoglobin 12.9.  CMP unremarkable.  INR 1.2.  Troponin 0.17.  BNP 23.2.   Ultrasound of lower extremity very limited and inconclusive.  No DVT noted in the areas visualized.  CT PE Study showed bilateral lower lobe pulmonary emboli.  Patient started on heparin and admitted to the hospitalist for further management.  Final Clinical Impressions(s) / ED Diagnoses   Final diagnoses:  None    ED Discharge Orders    None       Vallery Ridge, MD 01/22/19 1154     Benjiman Core, MD 01/22/19 2333

## 2019-01-21 NOTE — ED Notes (Signed)
Patient to CT.

## 2019-01-21 NOTE — ED Notes (Signed)
Vascular at bedside

## 2019-01-22 ENCOUNTER — Observation Stay (HOSPITAL_BASED_OUTPATIENT_CLINIC_OR_DEPARTMENT_OTHER)

## 2019-01-22 DIAGNOSIS — I5033 Acute on chronic diastolic (congestive) heart failure: Secondary | ICD-10-CM

## 2019-01-22 DIAGNOSIS — E785 Hyperlipidemia, unspecified: Secondary | ICD-10-CM | POA: Diagnosis present

## 2019-01-22 DIAGNOSIS — I480 Paroxysmal atrial fibrillation: Secondary | ICD-10-CM | POA: Diagnosis present

## 2019-01-22 DIAGNOSIS — R600 Localized edema: Secondary | ICD-10-CM

## 2019-01-22 DIAGNOSIS — Z7984 Long term (current) use of oral hypoglycemic drugs: Secondary | ICD-10-CM | POA: Diagnosis not present

## 2019-01-22 DIAGNOSIS — G4730 Sleep apnea, unspecified: Secondary | ICD-10-CM | POA: Diagnosis present

## 2019-01-22 DIAGNOSIS — Z79899 Other long term (current) drug therapy: Secondary | ICD-10-CM | POA: Diagnosis not present

## 2019-01-22 DIAGNOSIS — I2699 Other pulmonary embolism without acute cor pulmonale: Secondary | ICD-10-CM

## 2019-01-22 DIAGNOSIS — R0602 Shortness of breath: Secondary | ICD-10-CM | POA: Diagnosis present

## 2019-01-22 DIAGNOSIS — Z6841 Body Mass Index (BMI) 40.0 and over, adult: Secondary | ICD-10-CM | POA: Diagnosis not present

## 2019-01-22 DIAGNOSIS — I160 Hypertensive urgency: Secondary | ICD-10-CM | POA: Diagnosis present

## 2019-01-22 DIAGNOSIS — I5032 Chronic diastolic (congestive) heart failure: Secondary | ICD-10-CM

## 2019-01-22 DIAGNOSIS — Z20828 Contact with and (suspected) exposure to other viral communicable diseases: Secondary | ICD-10-CM | POA: Diagnosis present

## 2019-01-22 DIAGNOSIS — I110 Hypertensive heart disease with heart failure: Secondary | ICD-10-CM | POA: Diagnosis present

## 2019-01-22 DIAGNOSIS — Z87891 Personal history of nicotine dependence: Secondary | ICD-10-CM | POA: Diagnosis not present

## 2019-01-22 DIAGNOSIS — E119 Type 2 diabetes mellitus without complications: Secondary | ICD-10-CM | POA: Diagnosis present

## 2019-01-22 DIAGNOSIS — T45516A Underdosing of anticoagulants, initial encounter: Secondary | ICD-10-CM | POA: Diagnosis present

## 2019-01-22 DIAGNOSIS — Z91128 Patient's intentional underdosing of medication regimen for other reason: Secondary | ICD-10-CM | POA: Diagnosis not present

## 2019-01-22 DIAGNOSIS — Z7901 Long term (current) use of anticoagulants: Secondary | ICD-10-CM | POA: Diagnosis not present

## 2019-01-22 LAB — BASIC METABOLIC PANEL
Anion gap: 10 (ref 5–15)
BUN: 9 mg/dL (ref 6–20)
CO2: 30 mmol/L (ref 22–32)
Calcium: 9.1 mg/dL (ref 8.9–10.3)
Chloride: 98 mmol/L (ref 98–111)
Creatinine, Ser: 1.2 mg/dL (ref 0.61–1.24)
GFR calc Af Amer: >60 mL/min (ref >60)
GFR calc non Af Amer: >60 mL/min (ref >60)
Glucose, Bld: 147 mg/dL — ABNORMAL HIGH (ref 70–99)
Potassium: 3.8 mmol/L (ref 3.5–5.1)
Sodium: 138 mmol/L (ref 135–145)

## 2019-01-22 LAB — CBC WITH DIFFERENTIAL/PLATELET
Abs Immature Granulocytes: 0.02 10*3/uL (ref 0.00–0.07)
Basophils Absolute: 0 10*3/uL (ref 0.0–0.1)
Basophils Relative: 0 %
Eosinophils Absolute: 0.5 10*3/uL (ref 0.0–0.5)
Eosinophils Relative: 6 %
HCT: 43.5 % (ref 39.0–52.0)
Hemoglobin: 13.2 g/dL (ref 13.0–17.0)
Immature Granulocytes: 0 %
Lymphocytes Relative: 32 %
Lymphs Abs: 2.4 10*3/uL (ref 0.7–4.0)
MCH: 24.4 pg — ABNORMAL LOW (ref 26.0–34.0)
MCHC: 30.3 g/dL (ref 30.0–36.0)
MCV: 80.3 fL (ref 80.0–100.0)
Monocytes Absolute: 0.7 10*3/uL (ref 0.1–1.0)
Monocytes Relative: 9 %
Neutro Abs: 4 10*3/uL (ref 1.7–7.7)
Neutrophils Relative %: 53 %
Platelets: 344 10*3/uL (ref 150–400)
RBC: 5.42 MIL/uL (ref 4.22–5.81)
RDW: 15.5 % (ref 11.5–15.5)
WBC: 7.6 10*3/uL (ref 4.0–10.5)
nRBC: 0 % (ref 0.0–0.2)

## 2019-01-22 LAB — GLUCOSE, CAPILLARY
Glucose-Capillary: 122 mg/dL — ABNORMAL HIGH (ref 70–99)
Glucose-Capillary: 175 mg/dL — ABNORMAL HIGH (ref 70–99)
Glucose-Capillary: 141 mg/dL — ABNORMAL HIGH (ref 70–99)
Glucose-Capillary: 69 mg/dL — ABNORMAL LOW (ref 70–99)
Glucose-Capillary: 123 mg/dL — ABNORMAL HIGH (ref 70–99)
Glucose-Capillary: 127 mg/dL — ABNORMAL HIGH (ref 70–99)

## 2019-01-22 LAB — HEPATIC FUNCTION PANEL
ALT: 23 U/L (ref 0–44)
AST: 22 U/L (ref 15–41)
Albumin: 3.6 g/dL (ref 3.5–5.0)
Alkaline Phosphatase: 57 U/L (ref 38–126)
Bilirubin, Direct: <0.1 mg/dL (ref 0.0–0.2)
Total Bilirubin: 0.5 mg/dL (ref 0.3–1.2)
Total Protein: 7.9 g/dL (ref 6.5–8.1)

## 2019-01-22 LAB — TROPONIN I
Troponin I: 0.19 ng/mL (ref <0.03)
Troponin I: 0.16 ng/mL (ref <0.03)

## 2019-01-22 LAB — HEPARIN LEVEL (UNFRACTIONATED)
Heparin Unfractionated: 0.23 IU/mL — ABNORMAL LOW (ref 0.30–0.70)
Heparin Unfractionated: 0.2 IU/mL — ABNORMAL LOW (ref 0.30–0.70)
Heparin Unfractionated: 0.28 IU/mL — ABNORMAL LOW (ref 0.30–0.70)

## 2019-01-22 LAB — ECHOCARDIOGRAM LIMITED
Height: 71 in
Weight: 6736 oz

## 2019-01-22 LAB — HIV ANTIBODY (ROUTINE TESTING W REFLEX): HIV Screen 4th Generation wRfx: NONREACTIVE

## 2019-01-22 MED ORDER — HEPARIN BOLUS VIA INFUSION
2000.0000 [IU] | Freq: Once | INTRAVENOUS | Status: AC
  Administered 2019-01-22: 2000 [IU] via INTRAVENOUS
  Filled 2019-01-22: qty 2000

## 2019-01-22 MED ORDER — WARFARIN - PHARMACIST DOSING INPATIENT: Freq: Every day | Status: DC

## 2019-01-22 MED ORDER — PERFLUTREN LIPID MICROSPHERE
4.0000 mL | Freq: Once | INTRAVENOUS | Status: AC
  Administered 2019-01-22: 4 mL via INTRAVENOUS
  Filled 2019-01-22: qty 4

## 2019-01-22 MED ORDER — HYDRALAZINE HCL 20 MG/ML IJ SOLN: 10.0000 mg | INTRAMUSCULAR | Status: DC | PRN

## 2019-01-22 MED ORDER — WARFARIN SODIUM 7.5 MG PO TABS
15.0000 mg | ORAL_TABLET | Freq: Every day | ORAL | Status: DC
  Administered 2019-01-22: 15 mg via ORAL
  Filled 2019-01-22: qty 2

## 2019-01-22 MED ORDER — HEPARIN (PORCINE) 25000 UT/250ML-% IV SOLN
2500.0000 [IU]/h | INTRAVENOUS | Status: AC
  Administered 2019-01-22: 2400 [IU]/h via INTRAVENOUS
  Administered 2019-01-23: 2000 [IU]/h via INTRAVENOUS
  Filled 2019-01-22 (×3): qty 250

## 2019-01-22 NOTE — Progress Notes (Addendum)
ANTICOAGULATION CONSULT NOTE - Initial Consult  Pharmacy Consult for heparin Indication: pulmonary embolus  No Known Allergies  Patient Measurements: Height: 5\' 11"  (180.3 cm) Weight: (!) 390 lb (176.9 kg) IBW/kg (Calculated) : 75.3 Heparin Dosing Weight: 113 kg  Vital Signs: Temp: 98.3 F (36.8 C) (05/04 2318) Temp Source: Oral (05/04 1628) BP: 227/133 (05/04 2318) Pulse Rate: 85 (05/04 2318)  Labs: Recent Labs    01/21/19 1704 01/21/19 2252 01/22/19 0349  HGB 12.9*  --  13.2  HCT 43.5  --  43.5  PLT 346  --  344  LABPROT 15.2  --   --   INR 1.2  --   --   HEPARINUNFRC  --   --  0.23*  CREATININE 1.03  --   --   TROPONINI 0.17* 0.18*  --     Estimated Creatinine Clearance: 136 mL/min (by C-G formula based on SCr of 1.03 mg/dL).  Assessment: CC/HPI: 53 yo m presenting w LLE swelling x 2 weeks  PMH: HTN DM hx of PE on warfarin, CHF  Anticoag: warfarin pta for hx of PE (last dose 5/4??) Admit INR 1.2  Pulm: BL PE on CTA, no RHS  Heme/Onc: H&H 12.9/43.5, Plt 346  Initial heparin level subtherapeutic at 0.23  Goal of Therapy:  Heparin level 0.3-0.7 units/ml Monitor platelets by anticoagulation protocol: Yes   Plan:  Increase heparin gtt to 2150 units/hr F/u 6 hour heparin level  Addendum: To resume warfarin PTA dosing: 13.5mg  daily, last dose 5/4 Give warfarin 15mg  PO x1 tonight  Consider alternative oral AC  Daylene Posey, PharmD Clinical Pharmacist Please check AMION for all [facility] Pharmacy numbers 01/22/2019 4:20 AM

## 2019-01-22 NOTE — Progress Notes (Signed)
ANTICOAGULATION CONSULT NOTE  Pharmacy Consult for heparin Indication: pulmonary embolus, history of atrial fibrillation   No Known Allergies  Patient Measurements: Height: 5\' 11"  (180.3 cm) Weight: (!) 421 lb (191 kg) IBW/kg (Calculated) : 75.3 Heparin Dosing Weight: 123.2 kg  Vital Signs: Temp: 98 F (36.7 C) (05/05 0811) BP: 171/133 (05/05 0811) Pulse Rate: 106 (05/05 0811)  Labs: Recent Labs    01/21/19 1704 01/21/19 2252 01/22/19 0349 01/22/19 1006  HGB 12.9*  --  13.2  --   HCT 43.5  --  43.5  --   PLT 346  --  344  --   LABPROT 15.2  --   --   --   INR 1.2  --   --   --   HEPARINUNFRC  --   --  0.23* 0.20*  CREATININE 1.03  --  1.20  --   TROPONINI 0.17* 0.18* 0.19* 0.16*    Estimated Creatinine Clearance: 122.4 mL/min (by C-G formula based on SCr of 1.2 mg/dL).  Assessment: CC/HPI: 53 yo m presenting w LLE swelling x 2 weeks  PMH: HTN, DM, a-fib, CHF  Anticoag: warfarin PTA for a-fib (last dose per patient report 5/1). Admit INR 1.2  Pulm: BL PE on CTA, no RHS  Heme/Onc: Hgb 13.2, Pltc 344, WNL  1006 heparin level = 0.2 units/mL, subtherapeutic and decreased despite rate increase earlier this morning. No bleeding or complications/disruptions in therapy reported per nursing  Goal of Therapy:  INR 2-3 Heparin level 0.3-0.7 units/ml Monitor platelets by anticoagulation protocol: Yes    Plan:   Heparin 2000 units IV bolus x 1, then increase heparin infusion rate to 2400 units/hr  Check heparin level 6 hours after rate change  Continue order for warfarin 15mg  PO x 1 today as previously ordered  Daily CBC, heparin level, PT/INR  Monitor closely for s/sx of bleeding   Greer Pickerel, PharmD, BCPS Please check AMION for all [facility] Pharmacy numbers 01/22/2019 11:59 AM

## 2019-01-22 NOTE — Progress Notes (Signed)
ANTICOAGULATION CONSULT NOTE - Follow-Up Consult  Pharmacy Consult for heparin Indication: pulmonary embolus  No Known Allergies  Patient Measurements: Height: 5\' 11"  (180.3 cm) Weight: (!) 421 lb (191 kg) IBW/kg (Calculated) : 75.3 Heparin Dosing Weight: 113 kg  Vital Signs: Temp: 98.1 F (36.7 C) (05/05 1627) Temp Source: Oral (05/05 1627) BP: 157/134 (05/05 1628) Pulse Rate: 86 (05/05 1628)  Labs: Recent Labs    01/21/19 1704 01/21/19 2252 01/22/19 0349 01/22/19 1006 01/22/19 1820  HGB 12.9*  --  13.2  --   --   HCT 43.5  --  43.5  --   --   PLT 346  --  344  --   --   LABPROT 15.2  --   --   --   --   INR 1.2  --   --   --   --   HEPARINUNFRC  --   --  0.23* 0.20* 0.28*  CREATININE 1.03  --  1.20  --   --   TROPONINI 0.17* 0.18* 0.19* 0.16*  --     Estimated Creatinine Clearance: 122.4 mL/min (by C-G formula based on SCr of 1.2 mg/dL).  Assessment: CC/HPI: 53 yo m presenting w LLE swelling x 2 weeks  PMH: HTN DM hx of PE on warfarin, CHF  Anticoag: warfarin pta for hx of PE (last dose 5/4??) Admit INR 1.2  Pulm: BL PE on CTA, no RHS  Heme/Onc: H&H 12.9/43.5, Plt 346  Repeat heparin level remains slightly below goal this evening at 0.28. Per RN, heparin noted to be not infusion at shift-change, however unclear how long and lab was drawn prior. Will increase infusion rate slightly and recheck with daily labs.  Goal of Therapy:  Heparin level 0.3-0.7 units/ml Monitor platelets by anticoagulation protocol: Yes   Plan:  Increase heparin gtt to 2500 units/hr Monitor daily heparin level  Fredonia Highland, PharmD, BCPS Clinical Pharmacist [phone] Please check AMION for all [facility] Pharm[facility] numbers 01/22/2019

## 2019-01-22 NOTE — Plan of Care (Signed)

## 2019-01-22 NOTE — Progress Notes (Signed)
PROGRESS NOTE  Benjamin [name] EBR:[id number] DOB: 1965-11-16 DOA: 01/21/2019 PCP: System, Pcp Not In  HPI/Recap of past 24 hours:  Denies chest pain, no sob at rest, 1.5liter urine output since admission He is on heparin drip  He [facility] a [facility]  Assessment/Plan: Principal Problem:   Acute pulmonary embolism (HCC) Active Problems:   Hypertensive urgency   Diabetes mellitus type 2 in obese (HCC)  Acute bilateral PE, no right heart strain Venous doppler negative for DVT He is suppose to be on coumadin for afib, he reports he run out of coumadin Continue heparin drip, restart coumadin, INR goal 2-3  H/o PAF, acute on chronic diastolic chf exacerbation, HTN On iv lasix, Continue labetalol,  spironolactone, lisinopril, norvasc  NIDDM2 Hold metformin, on ssi  Morbid obesity Body mass index is 58.72 kg/m.   Code Status: full  Family Communication: patient   Disposition Plan: return to halfway house once INR >2   Consultants:  none  Procedures:  none  Antibiotics:  none   Objective: BP (!) 171/133 (BP Location: Left Arm)    Pulse (!) 106    Temp 98 F (36.7 C)    Resp 19    Ht 5\' 11"  (1.803 m)    Wt (!) 191 kg    SpO2 93%    BMI 58.72 kg/m   Intake/Output Summary (Last 24 hours) at 01/22/2019 1336 Last data filed at 01/22/2019 0881 Gross per 24 hour  Intake 178.5 ml  Output 1575 ml  Net -1396.5 ml   Filed Weights   01/21/19 1411 01/21/19 1720 01/22/19 1031  Weight: (!) 176.9 kg (!) 176.9 kg (!) 191 kg    Exam: Patient is examined daily including today on 01/22/2019, exams remain the same as of yesterday except that has changed    General:  NAD  Cardiovascular: RRR  Respiratory: CTABL  Abdomen: Soft/ND/NT, positive BS  Musculoskeletal: bilateral lower extremity Edema  Neuro: alert, oriented   Data Reviewed: Basic Metabolic Panel: Recent Labs  Lab 01/21/19 1704 01/22/19 0349  NA 138 138  K 3.7 3.8  CL 101 98  CO2  27 30  GLUCOSE 99 147*  BUN 9 9  CREATININE 1.03 1.20  CALCIUM 9.0 9.1   Liver Function Tests: Recent Labs  Lab 01/21/19 1704 01/22/19 0349  AST 19 22  ALT 21 23  ALKPHOS 53 57  BILITOT 0.5 0.5  PROT 7.3 7.9  ALBUMIN 3.4* 3.6   No results for input(s): LIPASE, AMYLASE in the last 168 hours. No results for input(s): AMMONIA in the last 168 hours. CBC: Recent Labs  Lab 01/21/19 1704 01/22/19 0349  WBC 6.7 7.6  NEUTROABS 3.6 4.0  HGB 12.9* 13.2  HCT 43.5 43.5  MCV 80.7 80.3  PLT 346 344   Cardiac Enzymes:   Recent Labs  Lab 01/21/19 1704 01/21/19 2252 01/22/19 0349 01/22/19 1006  TROPONINI 0.17* 0.18* 0.19* 0.16*   BNP (last 3 results) Recent Labs    01/21/19 1704  BNP 23.2    ProBNP (last 3 results) No results for input(s): PROBNP in the last 8760 hours.  CBG: Recent Labs  Lab 01/21/19 2320 01/22/19 0613 01/22/19 0812 01/22/19 1133  GLUCAP 149* 122* 175* 141*    Recent Results (from the past 240 hour(s))  SARS Coronavirus 2 (CEPHEID- Performed in [facility] hospital lab), Hosp Order     Status: None   Collection Time: 01/21/19  8:37 PM  Result Value Ref Range Status  SARS Coronavirus 2 NEGATIVE NEGATIVE Final    Comment: (NOTE) If result is NEGATIVE SARS-CoV-2 target nucleic acids are NOT DETECTED. The SARS-CoV-2 RNA is generally detectable in upper and lower  respiratory specimens during the acute phase of infection. The lowest  concentration of SARS-CoV-2 viral copies this assay can detect is 250  copies / mL. A negative result does not preclude SARS-CoV-2 infection  and should not be used as the sole basis for treatment or other  patient management decisions.  A negative result may occur with  improper specimen collection / handling, submission of specimen other  than nasopharyngeal swab, presence of viral mutation(s) within the  areas targeted by this assay, and inadequate number of viral copies  (<250 copies / mL). A negative result  must be combined with clinical  observations, patient history, and epidemiological information. If result is POSITIVE SARS-CoV-2 target nucleic acids are DETECTED. The SARS-CoV-2 RNA is generally detectable in upper and lower  respiratory specimens dur ing the acute phase of infection.  Positive  results are indicative of active infection with SARS-CoV-2.  Clinical  correlation with patient history and other diagnostic information is  necessary to determine patient infection status.  Positive results do  not rule out bacterial infection or co-infection with other viruses. If result is PRESUMPTIVE POSTIVE SARS-CoV-2 nucleic acids MAY BE PRESENT.   A presumptive positive result was obtained on the submitted specimen  and confirmed on repeat testing.  While 2019 novel coronavirus  (SARS-CoV-2) nucleic acids may be present in the submitted sample  additional confirmatory testing may be necessary for epidemiological  and / or clinical management purposes  to differentiate between  SARS-CoV-2 and other Sarbecovirus currently known to infect humans.  If clinically indicated additional testing with an alternate test  methodology [id number]) is advised. The SARS-CoV-2 RNA is generally  detectable in upper and lower respiratory sp ecimens during the acute  phase of infection. The expected result is Negative. Fact Sheet for Patients:  BoilerBrush.com.cyhttps://www.fda.gov/media/136312/download Fact Sheet for Healthcare Providers: https://pope.com/https://www.fda.gov/media/136313/download This test is not yet approved or cleared by the [place] FDA and has been authorized for detection and/or diagnosis of SARS-CoV-2 by FDA under an Emergency Use Authorization (EUA).  This EUA will remain in effect (meaning this test can be used) for the duration of the COVID-19 declaration under Section 564(b)(1) of the Act, 21 U.S.C. section 360bbb-3(b)(1), unless the authorization is terminated or revoked sooner. Performed at [facility], [address] [place], [place] [address]      Studies: Ct Angio Chest Pe W And/or Wo Contrast  Result Date: 01/21/2019 CLINICAL DATA:  Left leg pain and swelling with chest pain, initial encounter EXAM: CT ANGIOGRAPHY CHEST WITH CONTRAST TECHNIQUE: Multidetector CT imaging of the chest was performed using the standard protocol during bolus administration of intravenous contrast. Multiplanar CT image reconstructions and MIPs were obtained to evaluate the vascular anatomy. CONTRAST:  100mL OMNIPAQUE IOHEXOL 350 MG/ML SOLN COMPARISON:  None. FINDINGS: Cardiovascular: Thoracic aorta shows no significant atherosclerotic calcifications or aneurysmal dilatation. No coronary calcifications are seen. Heart is at the upper limits of normal in size. Pulmonary artery demonstrates a normal branching pattern with multiple filling defects in the lower lobes bilaterally consistent with pulmonary emboli. Mediastinum/Nodes: No hilar or mediastinal adenopathy is noted. The esophagus as visualized is within normal limits. The thoracic inlet is unremarkable. Lungs/Pleura: Lungs are well aerated bilaterally without focal infiltrate or sizable effusion. No parenchymal nodules are seen. Upper Abdomen: Visualized upper  abdomen is within normal limits. Musculoskeletal: Bony structures show degenerative change of the thoracic spine. No acute rib abnormality is seen. Surrounding soft tissue structures are within normal limits. Review of the MIP images confirms the above findings. IMPRESSION: Bilateral lower lobe pulmonary emboli. No definitive right heart strain is seen. Electronically Signed   By: Alcide Clever M.D.   On: 01/21/2019 20:08   Dg Chest Portable 1 View  Result Date: 01/21/2019 CLINICAL DATA:  Shortness of breath EXAM: PORTABLE CHEST 1 VIEW COMPARISON:  12/15/2010 FINDINGS: Cardiac silhouette is significantly enlarged. There is no pneumothorax. No large pleural effusion. Segmental atelectasis is noted at the  lung bases. There is some developing volume overload. IMPRESSION: Cardiomegaly with findings concerning for developing volume overload/pulmonary edema. Electronically Signed   By: Katherine Mantle M.D.   On: 01/21/2019 17:02   Vas [place] Lower Extremity Venous (dvt) (only [facility])  Result Date: 01/22/2019  Lower Venous Study Indications: Pain.  Limitations: Body habitus and Very limited visualization due to depth of vessels/ body habitus. Performing Technologist: Jeb Levering RDMS, RVT  Examination Guidelines: A complete evaluation includes B-mode imaging, spectral Doppler, color Doppler, and power Doppler as needed of all accessible portions of each vessel. Bilateral testing is considered an integral part of a complete examination. Limited examinations for reoccurring indications may be performed as noted.  +---------+---------------+---------+-----------+----------+--------------+  LEFT      Compressibility Phasicity Spontaneity Properties Summary         +---------+---------------+---------+-----------+----------+--------------+  CFV                                                        Not visualized  +---------+---------------+---------+-----------+----------+--------------+  SFJ                                                        Not visualized  +---------+---------------+---------+-----------+----------+--------------+  FV Prox                                                    Not visualized  +---------+---------------+---------+-----------+----------+--------------+  FV Mid                                                     Not visualized  +---------+---------------+---------+-----------+----------+--------------+  FV Distal                                                  Not visualized  +---------+---------------+---------+-----------+----------+--------------+  POP       Full            Yes       Yes                                     +---------+---------------+---------+-----------+----------+--------------+  PTV       Full                                                             +---------+---------------+---------+-----------+----------+--------------+  PERO                                                       Not visualized  +---------+---------------+---------+-----------+----------+--------------+     Summary: Left: Very limited, mostly inconclusive study of the left lower extremity. No DVT noted in areas visualized.  *See table(s) above for measurements and observations. Electronically signed by Fabienne Bruns MD on 01/22/2019 at 2:36:23 AM.    Final     Scheduled Meds:  amLODipine  10 mg Oral Daily   atorvastatin  20 mg Oral QHS   furosemide  80 mg Oral TID   insulin aspart  0-9 Units Subcutaneous TID WC   labetalol  200 mg Oral Daily   lisinopril  40 mg Oral Daily   pantoprazole  40 mg Oral Daily   spironolactone  50 mg Oral Daily   warfarin  15 mg Oral q1800   Warfarin - Pharmacist Dosing Inpatient   Does not apply q1800    Continuous Infusions:  heparin 2,400 Units/hr (01/22/19 1209)     Time spent: I have personally reviewed and interpreted on  01/22/2019 daily labs, tele strips, imagings as discussed above under date review session and assessment and plans.  I reviewed all nursing notes, pharmacy notes, vitals, pertinent old records  I have discussed plan of care as described above with RN , patient  on 01/22/2019   Albertine Grates MD, PhD  [facility] Pager [phone]. If 7PM-7AM, please contact night-coverage at [email or web address], password [facility] 01/22/2019, 1:36 PM  LOS: 0 days

## 2019-01-22 NOTE — Progress Notes (Signed)
Came to room notices that pt iv heparin drip was off. Pt stated machine turned off. No note of pt calling for staff to assist with iv pump. Unable to identify exactly how long iv pump was off with the exception of hourly rounds.

## 2019-01-22 NOTE — Progress Notes (Signed)
  Echocardiogram 2D Echocardiogram has been performed.  Benjamin [name] 01/22/2019, 9:56 AM

## 2019-01-22 NOTE — TOC Initial Note (Addendum)
Transition of Care [facility]) - Initial/Assessment Note    Patient Details  Name: Benjamin [name] MRN: [id number] Date of Birth: 08-26-1966  [facility]) CM/SW Contact:    Leone Haven, RN Phone Number: 01/22/2019, 3:12 PM  Clinical Narrative:                 [facility], he states he needs no transportation ast, he does not need med ast, he states sharon bell [phone] will be assisting him at discharge with medications, NCM called and left message for a return call with patient permission.   5/6 Letha Cape RN ,BSN -NCM spoke with Quinn Plowman, she states patient is with [facility], sharon states the patient will need to call them when he is discharged and they will send an uber to pick him up and also he will need to bring his prescriptions to them so they can pay for them to be filled.  MD states he possibly  may be dc on warfarin and lovenox.  Expected Discharge Plan: Home/Self Care Barriers to Discharge: No Barriers Identified   Patient Goals and CMS Choice Patient states their goals for this hospitalization and ongoing recovery are:: get better   Choice offered to / list presented to : NA  Expected Discharge Plan and Services Expected Discharge Plan: Home/Self Care In-house Referral: NA Discharge Planning Services: CM Consult Post Acute Care Choice: NA Living arrangements for the past 2 [facility]                 DME Arranged: N/A DME Agency: NA                  Prior Living Arrangements/Services Living arrangements for the past 2 months: ([facility], [facility]) Lives with:: Significant Other Patient language and need for interpreter reviewed:: Yes Do you feel safe going back to the place where you live?: Yes      Need for Family Participation in Patient Care: No (Comment) Care giver support system in place?: No (comment)   Criminal Activity/Legal Involvement Pertinent to  Current Situation/Hospitalization: No - Comment as needed  Activities of Daily Living Home Assistive Devices/Equipment: None ADL Screening (condition at time of admission) Patient's cognitive ability adequate to safely complete daily activities?: Yes Is the patient deaf or have difficulty hearing?: No Does the patient have difficulty seeing, even when wearing glasses/contacts?: No Does the patient have difficulty concentrating, remembering, or making decisions?: No Patient able to express need for assistance with ADLs?: Yes Does the patient have difficulty dressing or bathing?: Yes Independently performs ADLs?: Yes (appropriate for developmental age) Does the patient have difficulty walking or climbing stairs?: Yes Weakness of Legs: Both Weakness of Arms/Hands: Both  Permission Sought/Granted                  Emotional Assessment   Attitude/Demeanor/Rapport: (appropriate) Affect (typically observed): Appropriate Orientation: : Oriented to Self, Oriented to Place, Oriented to  Time, Oriented to Situation   Psych Involvement: No (comment)  Admission diagnosis:  Acute pulmonary embolism (HCC) [I26.99] Patient Active Problem List   Diagnosis Date Noted  . Acute pulmonary embolism (HCC) 01/21/2019  . Hypertensive urgency 01/21/2019  . Diabetes mellitus type 2 in obese (HCC) 01/21/2019   PCP:  System, Pcp Not In Pharmacy:   [facility] [phone] - [place], [place] - [name]  [place] [address] Phone: [phone] Fax: [phone]     Social Determinants of Health (SDOH) Interventions    Readmission Risk Interventions No flowsheet data found.

## 2019-01-23 LAB — CBC
HCT: 35.8 % — ABNORMAL LOW (ref 39.0–52.0)
Hemoglobin: 11.3 g/dL — ABNORMAL LOW (ref 13.0–17.0)
MCH: 24.6 pg — ABNORMAL LOW (ref 26.0–34.0)
MCHC: 31.6 g/dL (ref 30.0–36.0)
MCV: 78 fL — ABNORMAL LOW (ref 80.0–100.0)
Platelets: 298 10*3/uL (ref 150–400)
RBC: 4.59 MIL/uL (ref 4.22–5.81)
RDW: 15.4 % (ref 11.5–15.5)
WBC: 8.6 10*3/uL (ref 4.0–10.5)
nRBC: 0 % (ref 0.0–0.2)

## 2019-01-23 LAB — BASIC METABOLIC PANEL
Anion gap: 9 (ref 5–15)
BUN: 11 mg/dL (ref 6–20)
CO2: 29 mmol/L (ref 22–32)
Calcium: 8.8 mg/dL — ABNORMAL LOW (ref 8.9–10.3)
Chloride: 98 mmol/L (ref 98–111)
Creatinine, Ser: 1.16 mg/dL (ref 0.61–1.24)
GFR calc Af Amer: >60 mL/min (ref >60)
GFR calc non Af Amer: >60 mL/min (ref >60)
Glucose, Bld: 146 mg/dL — ABNORMAL HIGH (ref 70–99)
Potassium: 3.8 mmol/L (ref 3.5–5.1)
Sodium: 136 mmol/L (ref 135–145)

## 2019-01-23 LAB — HEMOGLOBIN A1C
Hgb A1c MFr Bld: 7.6 % — ABNORMAL HIGH (ref 4.8–5.6)
Mean Plasma Glucose: 171.42 mg/dL

## 2019-01-23 LAB — PROTIME-INR
INR: 1.1 (ref 0.8–1.2)
Prothrombin Time: 14.2 seconds (ref 11.4–15.2)

## 2019-01-23 LAB — HEPARIN LEVEL (UNFRACTIONATED)
Heparin Unfractionated: 0.36 IU/mL (ref 0.30–0.70)
Heparin Unfractionated: 0.37 IU/mL (ref 0.30–0.70)

## 2019-01-23 LAB — GLUCOSE, CAPILLARY
Glucose-Capillary: 105 mg/dL — ABNORMAL HIGH (ref 70–99)
Glucose-Capillary: 109 mg/dL — ABNORMAL HIGH (ref 70–99)
Glucose-Capillary: 120 mg/dL — ABNORMAL HIGH (ref 70–99)
Glucose-Capillary: 96 mg/dL (ref 70–99)

## 2019-01-23 MED ORDER — ENOXAPARIN SODIUM 300 MG/3ML IJ SOLN: 1.0000 mg/kg | Freq: Two times a day (BID) | INTRAMUSCULAR | Status: DC

## 2019-01-23 MED ORDER — ENOXAPARIN SODIUM 150 MG/ML ~~LOC~~ SOLN
150.0000 mg | Freq: Two times a day (BID) | SUBCUTANEOUS | Status: DC
  Administered 2019-01-23 – 2019-01-24 (×2): 150 mg via SUBCUTANEOUS
  Filled 2019-01-23 (×3): qty 1

## 2019-01-23 MED ORDER — ENOXAPARIN SODIUM 40 MG/0.4ML ~~LOC~~ SOLN
40.0000 mg | Freq: Two times a day (BID) | SUBCUTANEOUS | Status: DC
  Administered 2019-01-23 – 2019-01-24 (×2): 40 mg via SUBCUTANEOUS
  Filled 2019-01-23 (×2): qty 0.4

## 2019-01-23 MED ORDER — ENOXAPARIN (LOVENOX) PATIENT EDUCATION KIT
PACK | Freq: Once | Status: AC
  Administered 2019-01-23: 22:00:00
  Filled 2019-01-23: qty 1

## 2019-01-23 MED ORDER — WARFARIN SODIUM 7.5 MG PO TABS
15.0000 mg | ORAL_TABLET | Freq: Once | ORAL | Status: AC
  Administered 2019-01-23: 15 mg via ORAL
  Filled 2019-01-23: qty 2

## 2019-01-23 NOTE — Progress Notes (Addendum)
ANTICOAGULATION CONSULT NOTE  Pharmacy Consult for heparin + warfarin ---> lovenox + warfarin  Indication: pulmonary embolus, history of atrial fibrillation   No Known Allergies  Patient Measurements: Height: 5\' 11"  (180.3 cm) Weight: (!) 418 lb 4.8 oz (189.7 kg) IBW/kg (Calculated) : 75.3 Heparin Dosing Weight: 123.2 kg  Vital Signs: Temp: 98.5 F (36.9 C) (05/06 0741) Temp Source: Oral (05/06 0741) BP: 152/91 (05/06 0741) Pulse Rate: 88 (05/06 0741)  Labs: Recent Labs    01/21/19 1704 01/21/19 2252  01/22/19 0349 01/22/19 1006 01/22/19 1820 01/23/19 0441  HGB 12.9*  --   --  13.2  --   --  11.3*  HCT 43.5  --   --  43.5  --   --  35.8*  PLT 346  --   --  344  --   --  298  LABPROT 15.2  --   --   --   --   --  14.2  INR 1.2  --   --   --   --   --  1.1  HEPARINUNFRC  --   --    < > 0.23* 0.20* 0.28* 0.36  CREATININE 1.03  --   --  1.20  --   --   --   TROPONINI 0.17* 0.18*  --  0.19* 0.16*  --   --    < > = values in this interval not displayed.    Estimated Creatinine Clearance: 121.9 mL/min (by C-G formula based on SCr of 1.2 mg/dL).  Assessment: CC/HPI: 53 yo m presenting w LLE swelling x 2 weeks  PMH: HTN, DM, a-fib, CHF  Anticoag: warfarin PTA for a-fib (last dose per patient report 5/1). Admit INR 1.2  Pulm: BL PE on CTA, no RHS  Heme/Onc: Hgb decreased to 11.3, Pltc WNL  AM heparin level = 0.36 units/mL, now therapeutic  INR = 1.1, remains subtherapeutic after resumption of warfarin x 1 dose, as expected No bleeding or complications/disruptions in therapy reported per nursing  Goal of Therapy:  INR 2-3 Heparin level 0.3-0.7 units/ml Monitor platelets by anticoagulation protocol: Yes    Plan:   Continue IV heparin infusion at 2500 units/hr  Repeat heparin level at 1200 to ensure remains within therapeutic range   Repeat warfarin 15mg  PO x 1 today   Daily CBC, heparin level, PT/INR  Monitor closely for s/sx of bleeding   Greer Pickerel,  PharmD, BCPS Please check AMION for all [facility] Pharmacy numbers 01/23/2019 9:13 AM   Addendum:   1229 heparin level = 0.37 units/mL, remains therapeutic    Continue heparin infusion at current rate  Daily heparin levels moving forward   Greer Pickerel, PharmD, BCPS Pager: [phone] 01/23/2019 1:47 PM   Addendum:  Pharmacy consulted to switch IV heparin to Lovenox. Per Dr. Lowell Guitar, okay to start Lovenox tonight at 2200. SCr 1.16 with CrCl > 100 ml/min.   Plan:  Stop IV heparin at 2100.   Start Lovenox 1mg /kg SQ q12h at 2200 (will be using 2 syringes per dose due to patient weight-discussed with Dr. Lowell Guitar).  Continue Lovenox bridge until INR therapeutic x 2  Discussed above plan in detail with RN  Greer Pickerel, PharmD, BCPS Pager: [phone] 01/23/2019 3:45 PM

## 2019-01-23 NOTE — Progress Notes (Signed)
PROGRESS NOTE    [name]  MEB:[id number] DOB: 1966/04/29 DOA: 01/21/2019 PCP: System, Pcp Not In   Brief Narrative:  Benjamin [name] is Benjamin [name] 53 y.o. male with history of morbid obesity, diabetes mellitus type 2, hypertension, hyperlipidemia presents to the ER because of increasing shortness of breath and lower extremity edema ongoing for last 2 weeks.  Patient states he was just out of the prison last month and recently has been taking his medications regularly but over the last 1 week he has not taken.  Shortness of breath increased on exertion denies any associated productive cough fever chills or chest pain.  Has noted increasing swelling in the lower extremity and Benjamin [name] gained weight.  Admitted for bilateral PE.  Assessment & Plan:   Principal Problem:   Acute pulmonary embolism (HCC) Active Problems:   Hypertensive urgency   Diabetes mellitus type 2 in obese (HCC)   Bilateral lower extremity edema   Obesity, Class III, BMI 40-49.9 (morbid obesity) (HCC)   Acute on chronic diastolic CHF (congestive heart failure) (HCC)   PAF (paroxysmal atrial fibrillation) (HCC)   Acute bilateral PE, no right heart strain Venous doppler negative for DVT Echo with EF 60-65%, R heart not well visualized (see report) Coumadin restarted, transition to lovenox Difficult situation.  DOAC would be nice to use, but pt morbidly obese and data limited in this population. Will plan for lovenox/warfarin at discharge, but pt INR down today to 1.1 from 1.2 at presentation, will continue to discuss with pharmacy to determine discharge dose. Difficult situation, will need to ensure he has close follow up after discharge if going to discharge on lovenox/warfarin.   H/o PAF, acute on chronic diastolic chf exacerbation, HTN Lasix TID, Continue labetalol,  spironolactone, lisinopril, norvasc Stable  Elevated troponin: likely demand 2/2 above.  EKG appears similar to priors.  Needs outpatient cards follow  up.  NIDDM2 Hold metformin, on ssi Follow A1c  Morbid obesity Body mass index is 58.72 kg/m.  [facility].  Discussed with his CM Quinn Plowman. She can arrange follow up when he's discharged.  Will call prior to d/c [phone]), they can also get him whichever medications he'll need.  DVT prophylaxis: lovenox/warfarin Code Status: full  Family Communication: none at bedside Disposition Plan: pending.  INR significantly subtherapeutic.  D/c pending improvement in INR and decision on d/c dose of warfarin with pharmacy.  Hopefully within next 24-48 hrs if safe plan for discharge with lovenox/warfarin available.  Consultants:   none  Procedures:  Echo IMPRESSIONS    1. The left ventricle has normal systolic function, with an ejection fraction of 60-65%. The cavity size was normal. There is mildly increased left ventricular wall thickness. Left ventricular diastolic Doppler parameters are consistent with impaired  relaxation.  2. Left atrial size was not well visualized.  3. The aortic valve was not well visualized Aortic valve regurgitation was not assessed by color flow Doppler.  4. Extremely limited; definity used; normal LV systolic function; mild diastolic dysfunction; right heart not well visualized.  Antimicrobials:  Anti-infectives (From admission, onward)   None      Subjective: Feels ok.   Objective: Vitals:   01/23/19 0039 01/23/19 0603 01/23/19 0741 01/23/19 1603  BP: (!) 160/98  (!) 152/91 (!) 152/94  Pulse:   88 84  Resp:   (!) 22   Temp:   98.5 F (36.9 C) 97.9 F (36.6 C)  TempSrc:   Oral Oral  SpO2:  92% 96%  Weight:  (!) 189.7 kg    Height:        Intake/Output Summary (Last 24 hours) at 01/23/2019 2014 Last data filed at 01/23/2019 1800 Gross per 24 hour  Intake 1426.14 ml  Output 2325 ml  Net -898.86 ml   Filed Weights   01/21/19 1720 01/22/19 0627 01/23/19 0603  Weight: (!) 176.9 kg (!) 191 kg (!) 189.7 kg     Examination:  General exam: Appears calm and comfortable. obese Respiratory system: Clear to auscultation. Respiratory effort normal. Cardiovascular system: S1 & S2 heard, RRR Gastrointestinal system: Abdomen is nondistended, soft and nontender.  Central nervous system: Alert and oriented. No focal neurological deficits. Extremities: moving all extremities. Skin: No rashes, lesions or ulcers Psychiatry: Judgement and insight appear normal. Mood & affect appropriate.     Data Reviewed: I have personally reviewed following labs and imaging studies  CBC: Recent Labs  Lab 01/21/19 1704 01/22/19 0349 01/23/19 0441  WBC 6.7 7.6 8.6  NEUTROABS 3.6 4.0  --   HGB 12.9* 13.2 11.3*  HCT 43.5 43.5 35.8*  MCV 80.7 80.3 78.0*  PLT 346 344 298   Basic Metabolic Panel: Recent Labs  Lab 01/21/19 1704 01/22/19 0349 01/23/19 0903  NA 138 138 136  K 3.7 3.8 3.8  CL 101 98 98  CO2 27 30 29   GLUCOSE 99 147* 146*  BUN 9 9 11   CREATININE 1.03 1.20 1.16  CALCIUM 9.0 9.1 8.8*   GFR: Estimated Creatinine Clearance: 126.1 mL/min (by C-G formula based on SCr of 1.16 mg/dL). Liver Function Tests: Recent Labs  Lab 01/21/19 1704 01/22/19 0349  AST 19 22  ALT 21 23  ALKPHOS 53 57  BILITOT 0.5 0.5  PROT 7.3 7.9  ALBUMIN 3.4* 3.6   No results for input(s): LIPASE, AMYLASE in the last 168 hours. No results for input(s): AMMONIA in the last 168 hours. Coagulation Profile: Recent Labs  Lab 01/21/19 1704 01/23/19 0441  INR 1.2 1.1   Cardiac Enzymes: Recent Labs  Lab 01/21/19 1704 01/21/19 2252 01/22/19 0349 01/22/19 1006  TROPONINI 0.17* 0.18* 0.19* 0.16*   BNP (last 3 results) No results for input(s): PROBNP in the last 8760 hours. HbA1C: No results for input(s): HGBA1C in the last 72 hours. CBG: Recent Labs  Lab 01/22/19 1711 01/22/19 2110 01/23/19 0737 01/23/19 1202 01/23/19 1608  GLUCAP 123* 127* 105* 109* 120*   Lipid Profile: No results for input(s): CHOL,  HDL, LDLCALC, TRIG, CHOLHDL, LDLDIRECT in the last 72 hours. Thyroid Function Tests: No results for input(s): TSH, T4TOTAL, FREET4, T3FREE, THYROIDAB in the last 72 hours. Anemia Panel: No results for input(s): VITAMINB12, FOLATE, FERRITIN, TIBC, IRON, RETICCTPCT in the last 72 hours. Sepsis Labs: No results for input(s): PROCALCITON, LATICACIDVEN in the last 168 hours.  Recent Results (from the past 240 hour(s))  SARS Coronavirus 2 (CEPHEID- Performed in [facility] hospital lab), Hosp Order     Status: None   Collection Time: 01/21/19  8:37 PM  Result Value Ref Range Status   SARS Coronavirus 2 NEGATIVE NEGATIVE Final    Comment: (NOTE) If result is NEGATIVE SARS-CoV-2 target nucleic acids are NOT DETECTED. The SARS-CoV-2 RNA is generally detectable in upper and lower  respiratory specimens during the acute phase of infection. The lowest  concentration of SARS-CoV-2 viral copies this assay can detect is 250  copies / mL. Eloyce Bultman negative result does not preclude SARS-CoV-2 infection  and should not be used as the sole basis  for treatment or other  patient management decisions.  Sherrill Buikema negative result may occur with  improper specimen collection / handling, submission of specimen other  than nasopharyngeal swab, presence of viral mutation(s) within the  areas targeted by this assay, and inadequate number of viral copies  (<250 copies / mL). Maricela Kawahara negative result must be combined with clinical  observations, patient history, and epidemiological information. If result is POSITIVE SARS-CoV-2 target nucleic acids are DETECTED. The SARS-CoV-2 RNA is generally detectable in upper and lower  respiratory specimens dur ing the acute phase of infection.  Positive  results are indicative of active infection with SARS-CoV-2.  Clinical  correlation with patient history and other diagnostic information is  necessary to determine patient infection status.  Positive results do  not rule out bacterial infection  or co-infection with other viruses. If result is PRESUMPTIVE POSTIVE SARS-CoV-2 nucleic acids MAY BE PRESENT.   Shaleena Crusoe presumptive positive result was obtained on the submitted specimen  and confirmed on repeat testing.  While 2019 novel coronavirus  (SARS-CoV-2) nucleic acids may be present in the submitted sample  additional confirmatory testing may be necessary for epidemiological  and / or clinical management purposes  to differentiate between  SARS-CoV-2 and other Sarbecovirus currently known to infect humans.  If clinically indicated additional testing with an alternate test  methodology [phone] is advised. The SARS-CoV-2 RNA is generally  detectable in upper and lower respiratory sp ecimens during the acute  phase of infection. The expected result is Negative. Fact Sheet for Patients:  BoilerBrush.com.cyhttps://www.fda.gov/media/136312/download Fact Sheet for Healthcare Providers: https://pope.com/https://www.fda.gov/media/136313/download This test is not yet approved or cleared by the [place] FDA and has been authorized for detection and/or diagnosis of SARS-CoV-2 by FDA under an Emergency Use Authorization (EUA).  This EUA will remain in effect (meaning this test can be used) for the duration of the COVID-19 declaration under Section 564(b)(1) of the Act, 21 U.S.C. section 360bbb-3(b)(1), unless the authorization is terminated or revoked sooner. Performed at [facility], [address] [place], [place] [address]          Radiology Studies: No results found.      Scheduled Meds: . amLODipine  10 mg Oral Daily  . atorvastatin  20 mg Oral QHS  . enoxaparin (LOVENOX) injection  150 mg Subcutaneous Q12H   And  . enoxaparin (LOVENOX) injection  40 mg Subcutaneous Q12H  . enoxaparin   Does not apply Once  . furosemide  80 mg Oral TID  . insulin aspart  0-9 Units Subcutaneous TID WC  . labetalol  200 mg Oral Daily  . lisinopril  40 mg Oral Daily  . pantoprazole  40 mg Oral Daily  .  spironolactone  50 mg Oral Daily  . Warfarin - Pharmacist Dosing Inpatient   Does not apply q1800   Continuous Infusions: . heparin 2,000 Units/hr (01/23/19 1531)     LOS: 1 day    Time spent: over 30 min    Lacretia Nicksaldwell Powell, MD [facility] Pager AMION  If 7PM-7AM, please contact night-coverage [email or web address] Password [email or web address] 01/23/2019, 8:14 PM

## 2019-01-24 LAB — PROTIME-INR
INR: 1.4 — ABNORMAL HIGH (ref 0.8–1.2)
Prothrombin Time: 16.7 seconds — ABNORMAL HIGH (ref 11.4–15.2)

## 2019-01-24 LAB — CBC
HCT: 43.9 % (ref 39.0–52.0)
Hemoglobin: 13.2 g/dL (ref 13.0–17.0)
MCH: 24 pg — ABNORMAL LOW (ref 26.0–34.0)
MCHC: 30.1 g/dL (ref 30.0–36.0)
MCV: 80 fL (ref 80.0–100.0)
Platelets: 340 10*3/uL (ref 150–400)
RBC: 5.49 MIL/uL (ref 4.22–5.81)
RDW: 15.5 % (ref 11.5–15.5)
WBC: 8 10*3/uL (ref 4.0–10.5)
nRBC: 0 % (ref 0.0–0.2)

## 2019-01-24 LAB — GLUCOSE, CAPILLARY
Glucose-Capillary: 118 mg/dL — ABNORMAL HIGH (ref 70–99)
Glucose-Capillary: 101 mg/dL — ABNORMAL HIGH (ref 70–99)

## 2019-01-24 MED ORDER — WARFARIN SODIUM 7.5 MG PO TABS: 15.0000 mg | ORAL_TABLET | Freq: Every day | ORAL | 0 refills | Status: DC

## 2019-01-24 MED ORDER — ENOXAPARIN SODIUM 40 MG/0.4ML ~~LOC~~ SOLN: 40.0000 mg | Freq: Two times a day (BID) | SUBCUTANEOUS | 0 refills | Status: DC

## 2019-01-24 MED ORDER — WARFARIN SODIUM 7.5 MG PO TABS
15.0000 mg | ORAL_TABLET | Freq: Once | ORAL | Status: AC
  Administered 2019-01-24: 15 mg via ORAL
  Filled 2019-01-24: qty 2

## 2019-01-24 MED ORDER — ENOXAPARIN SODIUM 150 MG/ML ~~LOC~~ SOLN: 150.0000 mg | Freq: Two times a day (BID) | SUBCUTANEOUS | 0 refills | Status: DC

## 2019-01-24 NOTE — Discharge Summary (Signed)
**Note De-Identified vi Obfusction** Physicin Dischrge Summry  [name] XBJ:[id number] DOB: 01-24-1966 DOA: 01/21/2019  PCP: System, Pcp Not In  Admit dte: 01/21/2019 Dischrge dte: 01/24/2019  Time spent: 50 minutes  Recommendtions for Outptient Follow-up:  1. Follow up outptient CBC/CMP 2. Pt subtherpeutic INR.   Instructed to continue lovenox 190 mg BID with wrfrin t 15 mg dily.  Discussed need for follow up on Sturdy 5/9 [facility] for INR check nd to determine if chnge needed to wrfrin dosing.  Would recommend follow up on 5/11-5/12 s well.  Discontinue lovenox once INR is between 2-3.  Pt will need close follow up per outptient provider to ensure INR remins therpeutic given PE.  3. 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)   Bilterl lower extremity edem   Obesity, Clss III, BMI 40-49.9 (morbid obesity) (HCC)   Acute on chronic distolic CHF (congestive hert filure) (HCC)   PAF (proxysml tril fibrilltion) (HCC)   Dischrge Condition: stble  Diet recommendtion: hert helthy  Filed Weights   01/22/19 0627 01/23/19 0603 01/24/19 0439  Weight: (!) 191 kg (!) 189.7 kg (!) 187.5 kg    History of present illness:  Benjamin [name]  53 y.o.mlewithhistory of morbid obesity, dibetes mellitus type 2, hypertension, hyperlipidemi presents to the ER becuse of incresing shortness of breth nd lower extremity edem ongoing for lst 2 weeks. Ptient sttes he ws just out of the prison lst month nd recently hs been tking his medictions regulrly but over the lst 1 week he hs not tken. Shortness of breth incresed on exertion denies ny ssocited productive cough fever chills or chest pin. Hs noted incresing swelling in the lower extremity nd  gined  weight.  Admitted for bilterl PE.  Dischrged on lovenox/wrfrin.  See below for dditionl detils  Hospitl Course:  Acute bilterlPE, no right hert strin Venous doppler negtive for DVT Echo with EF 60-65%, R hert not well visulized (see report) Coumdin restrted, trnsition to lovenox Difficult sitution.  DOAC would be nice to use, but pt morbidly obese nd dt limited in this popultion. Hd extensive discussion regrding lovenox bridge with wrfrin tody.  He ws ble to discuss lovenox injections with me nd seemed comfortble.  He ws ble to tell me when INR ws therpeutic t 2-3.  Discussed importnce of close follow up with outptient providers nd need to continue lovenox until INR therpeutic (s well s need to d/c lovenox when INR therpeutic).  Discussed with his cse mnger need for close follow up on Sturdy nd then Mondy or Tuesdy (fter tht, would be per his PCP) Dischrged on 15 mg wrfrin dily with lovenox bridge, follow up with outptient provider for further dosing of lovenox KoreS of LLE inconclusive, limited study, no DVT noted  H/o PAF, cute on chronic distolic chf excerbtion, HTN Lsix TID, Continue lbetlol, spironolctone, lisinopril, norvsc Stble  Elevted troponin: likely demnd 2/2 bove.  EKG ppers similr to priors.  Needs outptient crds follow up.  NIDDM2 Resume home meds  Morbid obesity Body mss index is 58.72 kg/m.  [facility].  Discussed with his CM Quinn PlowmnShron Bell.  Discussed follow up pln tody prior to d/c.  Procedures: Echo IMPRESSIONS   1. The left ventricle hs norml systolic function, with n ejection frction of 60-65%. **Note De-Identified vi Obfusction** The cvity size ws norml. There is mildly incresed left ventriculr wll thickness. Left ventriculr distolic Doppler prmeters re consistent with impired  relxtion. 2. Left tril size ws not well visulized. 3. The ortic vlve ws not well  visulized Aortic vlve regurgittion ws not ssessed by color flow Doppler. 4. Extremely limited; definity used; norml LV systolic function; mild distolic dysfunction; right hert not well visulized.  Summry: Left: Very limited, mostly inconclusive study of the left lower extremity. No DVT noted in res visulized.  Consulttions:  none  Dischrge Exm: Vitls:   01/24/19 0025 01/24/19 0731  BP: (!) 156/99 (!) 167/107  Pulse: 87 85  Resp:  20  Temp: 98.1 F (36.7 C) 98.3 F (36.8 C)  SpO2: 94% 91%   Feels better Feels confident regrding d/c pln  Generl: No cute distress. Obese. Crdiovsculr: Hert sounds show  regulr rte, nd rhythm.  Lungs: Cler to usculttion bilterlly  Abdomen: Soft, nontender, nondistended  Neurologicl: Alert nd oriented 3. Moves ll extremities 4 with equl strength. Crnil nerves II through XII grossly intct. Skin: Wrm nd dry. No rshes or lesions. Extremities: No clubbing or cynosis. No edem. Pedl pulses 2+. Psychitric: Mood nd ffect re norml. Insight nd judgment re pproprite.   Dischrge Instructions   Dischrge Instructions    Cll MD for:  difficulty brething, hedche or visul disturbnces   Complete by:  As directed    Cll MD for:  extreme ftigue   Complete by:  As directed    Cll MD for:  persistnt dizziness or light-hededness   Complete by:  As directed    Cll MD for:  persistnt nuse nd vomiting   Complete by:  As directed    Cll MD for:  redness, tenderness, or signs of infection (pin, swelling, redness, odor or green/yellow dischrge round incision site)   Complete by:  As directed    Cll MD for:  severe uncontrolled pin   Complete by:  As directed    Cll MD for:  temperture >100.4   Complete by:  As directed    Diet - low sodium hert helthy   Complete by:  As directed    Dischrge instructions   Complete by:  As directed    You were seen for  blood clot in your  lungs.  You were strted on heprin nd we've continued your wrfrin (coumdin).  Your INR is still low.  It is 1.4 on 5/7.  We will dischrge you on lovenox to tke twice dily while we wit for your INR to become therpeutic.  Continue to tke the lovenox injections twice dily (150 mg nd 40 mg every 12 hours) with your wrfrin t 15 mg until your INR is between 2 nd 3.  When your INR is between 2-3 the lovenox cn be discontinued.  Plese follow up t [facility] this Sturdy (5/9) to check your INR nd for them to determine whether to continue your current wrfrin dose or to djust it.  They'll lso tell you whether to continue the lovenox.  You should follow up gin with [facility] on Mondy or Tuesdy (5/11 or 5/12) for  repet INR check.  After this ppointment, they'll let you know when to follow bck up with them nd whether you need to mke ny chnges with your medictions.  These follow up ppointments re extremely importnt if you're going to be dischrged on both lovenox nd wrfrin nd it's extremely importnt tht you follow **Note De-Identified vi Obfusction** up over the next few dys s plnned so your blood thinners cn be mnged sfely nd ppropritely.    It's extremely importnt going forwrd tht you continue your wrfrin nd wtch your INR levels.  Plese follow up regulrly with your PCP for this.  Return for new, recurrent or worsening symptoms.  Plese sk your PCP to request records from this hospitliztion so they know wht ws done nd wht the next steps will be.   Increse ctivity slowly   Complete by:  As directed      Allergies s of 01/24/2019   No Known Allergies     Mediction List    TAKE these medictions   mLODipine 10 MG tblet Commonly known s:  NORVASC Tke 10 mg by mouth dily.   torvsttin 20 MG tblet Commonly known s:  LIPITOR Tke 20 mg by mouth t bedtime.   COUGH DROPS MT 1 drop s needed (for throt dryness).   enoxprin 150  MG/ML injection Commonly known s:  LOVENOX Inject 1 mL (150 mg totl) into the skin every 12 (twelve) hours for 7 dys. (until INR between 2-3, then discontinue)   enoxprin 40 MG/0.4ML injection Commonly known s:  LOVENOX Inject 0.4 mLs (40 mg totl) into the skin every 12 (twelve) hours for 7 dys. (until INR between 2-3, then discontinue)   furosemide 80 MG tblet Commonly known s:  LASIX Tke 80 mg by mouth 3 (three) times dily.   lbetlol 200 MG tblet Commonly known s:  NORMODYNE Tke 200 mg by mouth dily.   lisinopril 40 MG tblet Commonly known s:  ZESTRIL Tke 40 mg by mouth dily.   metFORMIN 500 MG tblet Commonly known s:  GLUCOPHAGE Tke 500 mg by mouth 2 (two) times  dy.   omeprzole 40 MG cpsule Commonly known s:  PRILOSEC Tke 40 mg by mouth dily before brekfst.   spironolctone 50 MG tblet Commonly known s:  ALDACTONE Tke 50 mg by mouth dily.   Visine Dry Eye 0.2-0.2-1 % Soln Generic drug:  Glycerin-Hypromellose-PEG 400 Plce 1-2 drops into both eyes 2 (two) times  dy.   wrfrin 7.5 MG tblet Commonly known s:  COUMADIN Tke 2 tblets (15 mg totl) by mouth dily fter brekfst for 30 dys. (plese follow up t ppointment on Sturdy to determine if dose needs to be chnged) Wht chnged:    how much to tke  how to tke this  dditionl instructions  Another mediction with the sme nme ws removed. Continue tking this mediction, nd follow the directions you see here.      No Known Allergies    The results of significnt dignostics from this hospitliztion (including imging, microbiology, ncillry nd lbortory) re listed below for reference.    Significnt Dignostic Studies: Ct Angio Chest Pe W And/or Wo Contrst  Result Dte: 01/21/2019 CLINICAL DATA:  Left leg pin nd swelling with chest pin, initil encounter EXAM: CT ANGIOGRAPHY CHEST WITH CONTRAST TECHNIQUE: Multidetector CT imging of the chest  ws performed using the stndrd protocol during bolus dministrtion of intrvenous contrst. Multiplnr CT imge reconstructions nd MIPs were obtined to evlute the vsculr ntomy. CONTRAST:  OMNIPAQUE IOHEXOL 350 MG/ML SOLN COMPARISON:  None. FINDINGS: Crdiovsculr: Thorcic ort shows no significnt therosclerotic clcifictions or neurysml dilttion. No coronry clcifictions re seen. Hert is t the upper limits of norml in size. Pulmonry rtery demonstrtes  norml brnching pttern with multiple filling defects in the lower lobes bilterlly consistent with pulmonry emboli. **Note De-Identified vi Obfusction** Medistinum/Nodes: No hilr or medistinl denopthy is noted. The esophgus s visulized is within norml limits. The thorcic inlet is unremrkble. Lungs/Pleur: Lungs re well erted bilterlly without focl infiltrte or sizble effusion. No prenchyml nodules re seen. Upper bdomen: Visulized upper bdomen is within norml limits. Musculoskeletl: Bony structures show degenertive chnge of the thorcic spine. No cute rib bnormlity is seen. Surrounding soft tissue structures re within norml limits. Review of the MIP imges confirms the bove findings. IMPRESSION: Bilterl lower lobe pulmonry emboli. No definitive right hert strin is seen. Electroniclly Signed   By: lcide Clever M.D.   On: 01/21/2019 20:08   Dg Chest Portble 1 View  Result Dte: 01/21/2019 CLINICL DT:  Shortness of breth EXM: PORTBLE CHEST 1 VIEW COMPRISON:  12/15/2010 FINDINGS: Crdic silhouette is significntly enlrged. There is no pneumothorx. No lrge pleurl effusion. Segmentl telectsis is noted t the lung bses. There is some developing volume overlod. IMPRESSION: Crdiomegly with findings concerning for developing volume overlod/pulmonry edem. Electroniclly Signed   By: Ktherine Mntle M.D.   On: 01/21/2019 17:02   Vs Kore Lower Extremity Venous (dvt) (only Mc [facility])  Result Dte:  01/22/2019  Lower Venous Study Indictions: Pin.  Limittions: Body hbitus nd Very limited visuliztion due to depth of vessels/ body hbitus. Performing Technologist: Jeb Levering RDMS, RVT  Exmintion Guidelines:  complete evlution includes B-mode imging, spectrl Doppler, color Doppler, nd power Doppler s needed of ll ccessible portions of ech vessel. Bilterl testing is considered n integrl prt of  complete exmintion. Limited exmintions for reoccurring indictions my be performed s noted.  +---------+---------------+---------+-----------+----------+--------------+ LEFT     CompressibilityPhsicitySpontneityPropertiesSummry        +---------+---------------+---------+-----------+----------+--------------+ CFV                                                   Not visulized +---------+---------------+---------+-----------+----------+--------------+ SFJ                                                   Not visulized +---------+---------------+---------+-----------+----------+--------------+ FV Prox                                               Not visulized +---------+---------------+---------+-----------+----------+--------------+ FV Mid                                                Not visulized +---------+---------------+---------+-----------+----------+--------------+ FV Distl                                             Not visulized +---------+---------------+---------+-----------+----------+--------------+ POP      Full           Yes      Yes                                 +---------+---------------+---------+-----------+----------+--------------+  PTV      Full                                                        +---------+---------------+---------+-----------+----------+--------------+ PERO                                                  Not visualized  +---------+---------------+---------+-----------+----------+--------------+     Summary: Left: Very limited, mostly inconclusive study of the left lower extremity. No DVT noted in areas visualized.  *See table(s) above for measurements and observations. Electronically signed by Fabienne Bruns MD on 01/22/2019 at 2:36:23 M.    Final     Microbiology: Recent Results (from the past 240 hour(s))  SRS Coronavirus 2 (CEPHEID- Performed in [facility] hospital lab), Hosp Order     Status: None   Collection Time: 01/21/19  8:37 PM  Result Value Ref Range Status   SRS Coronavirus 2 NEGTIVE NEGTIVE Final    Comment: (NOTE) If result is NEGTIVE SRS-CoV-2 target nucleic acids are NOT DETECTED. The SRS-CoV-2 RN is generally detectable in upper and lower  respiratory specimens during the acute phase of infection. The lowest  concentration of SRS-CoV-2 viral copies this assay can detect is 250  copies / mL.  negative result does not preclude SRS-CoV-2 infection  and should not be used as the sole basis for treatment or other  patient management decisions.   negative result may occur with  improper specimen collection / handling, submission of specimen other  than nasopharyngeal swab, presence of viral mutation(s) within the  areas targeted by this assay, and inadequate number of viral copies  (<250 copies / mL).  negative result must be combined with clinical  observations, patient history, and epidemiological information. If result is POSITIVE SRS-CoV-2 target nucleic acids are DETECTED. The SRS-CoV-2 RN is generally detectable in upper and lower  respiratory specimens dur ing the acute phase of infection.  Positive  results are indicative of active infection with SRS-CoV-2.  Clinical  correlation with patient history and other diagnostic information is  necessary to determine patient infection status.  Positive results do  not rule out bacterial infection or co-infection with other  viruses. If result is PRESUMPTIVE POSTIVE SRS-CoV-2 nucleic acids MY BE PRESENT.    presumptive positive result was obtained on the submitted specimen  and confirmed on repeat testing.  While 2019 novel coronavirus  (SRS-CoV-2) nucleic acids may be present in the submitted sample  additional confirmatory testing may be necessary for epidemiological  and / or clinical management purposes  to differentiate between  SRS-CoV-2 and other Sarbecovirus currently known to infect humans.  If clinically indicated additional testing with an alternate test  methodology [phone]) is advised. The SRS-CoV-2 RN is generally  detectable in upper and lower respiratory sp ecimens during the acute  phase of infection. The expected result is Negative. Fact Sheet for Patients:  BoilerBrush.com.cy Fact Sheet for Healthcare Providers: https://pope.com/ This test is not yet approved or cleared by the [place] [facility] and has been authorized for detection and/or diagnosis of SRS-CoV-2 by FD under an Emergency Use uthorization (EU).  This EU will remain in effect (meaning this test can be  used) for the duration of the COVID-19 declaration under Section 564(b)(1) of the Act, 21 U.S.C. section 360bbb-3(b)(1), unless the authorization is terminated or revoked sooner. Performed at [facility], [address] [place], [place] [address]      Labs: Basic Metabolic Panel: Recent Labs  Lab 01/21/19 1704 01/22/19 0349 01/23/19 0903  NA 138 138 136  K 3.7 3.8 3.8  CL 101 98 98  CO2 GLUCOSE 99 147* 146*  BUN CREATININE 1.03 1.20 1.16  CALCIUM 9.0 9.1 8.8*   Liver Function Tests: Recent Labs  Lab 01/21/19 1704 01/22/19 0349  AST 19 22  ALT 21 23  ALKPHOS 53 57  BILITOT 0.5 0.5  PROT 7.3 7.9  ALBUMIN 3.4* 3.6   No results for input(s): LIPASE, AMYLASE in the last 168 hours. No results for input(s): AMMONIA in the  last 168 hours. CBC: Recent Labs  Lab 01/21/19 1704 01/22/19 0349 01/23/19 0441 01/24/19 0304  WBC 6.7 7.6 8.6 8.0  NEUTROABS 3.6 4.0  --   --   HGB 12.9* 13.2 11.3* 13.2  HCT 43.5 43.5 35.8* 43.9  MCV 80.7 80.3 78.0* 80.0  PLT 346 344 298 340   Cardiac Enzymes: Recent Labs  Lab 01/21/19 1704 01/21/19 2252 01/22/19 0349 01/22/19 1006  TROPONINI 0.17* 0.18* 0.19* 0.16*   BNP: BNP (last 3 results) Recent Labs    01/21/19 1704  BNP 23.2    ProBNP (last 3 results) No results for input(s): PROBNP in the last 8760 hours.  CBG: Recent Labs  Lab 01/23/19 1202 01/23/19 1608 01/23/19 2109 01/24/19 0727 01/24/19 1054  GLUCAP 109* 120* 96 118* 101*       Signed:  Lacretia Nicks MD.  [facility] 01/24/2019, 12:06 PM

## 2019-01-24 NOTE — Progress Notes (Signed)
ANTICOAGULATION CONSULT NOTE  Pharmacy Consult for lovenox + warfarin  Indication: pulmonary embolus, history of atrial fibrillation   No Known Allergies  Patient Measurements: Height: 5\' 11"  (180.3 cm) Weight: (!) 413 lb 4.8 oz (187.5 kg) IBW/kg (Calculated) : 75.3 Heparin Dosing Weight: 123.2 kg  Vital Signs: Temp: 98.3 F (36.8 C) (05/07 0731) Temp Source: Oral (05/07 0731) BP: 167/107 (05/07 0731) Pulse Rate: 85 (05/07 0731)  Labs: Recent Labs    01/21/19 1704 01/21/19 2252  01/22/19 0349 01/22/19 1006 01/22/19 1820 01/23/19 0441 01/23/19 0903 01/23/19 1229 01/24/19 0304  HGB 12.9*  --   --  13.2  --   --  11.3*  --   --  13.2  HCT 43.5  --   --  43.5  --   --  35.8*  --   --  43.9  PLT 346  --   --  344  --   --  298  --   --  340  LABPROT 15.2  --   --   --   --   --  14.2  --   --  16.7*  INR 1.2  --   --   --   --   --  1.1  --   --  1.4*  HEPARINUNFRC  --   --    < > 0.23* 0.20* 0.28* 0.36  --  0.37  --   CREATININE 1.03  --   --  1.20  --   --   --  1.16  --   --   TROPONINI 0.17* 0.18*  --  0.19* 0.16*  --   --   --   --   --    < > = values in this interval not displayed.    Estimated Creatinine Clearance: 125.2 mL/min (by C-G formula based on SCr of 1.16 mg/dL).  Assessment: CC/HPI: 53 yo m presenting w LLE swelling x 2 weeks  PMH: HTN, DM, a-fib, CHF  Anticoag: warfarin PTA for a-fib (last dose per patient report 5/1). Admit INR 1.2  Pulm: BL PE on CTA, no RHS  Heme/Onc: Hgb Pltc WNL  Renal: SCr 1.16 (5/6) with CrCl > 100 ml/min   INR = 1.4, trending up after warfarin 15mg  PO x 2 days No bleeding reported   Goal of Therapy:  INR 2-3 Heparin level 0.3-0.7 units/ml Monitor platelets by anticoagulation protocol: Yes    Plan:   Continue Lovenox 1mg /kg SQ q12h (will be using 2 syringes per dose due to patient weight-discussed with Dr. Lowell Guitar).  Continue Lovenox bridge until INR therapeutic x 2  Repeat warfarin 15mg  PO x 1 now  Daily  PT/INR  Monitor closely for s/sx of bleeding  Greer Pickerel, PharmD, BCPS Pager: [phone] 01/23/2019 3:45 PM

## 2019-02-06 ENCOUNTER — Emergency Department (HOSPITAL_COMMUNITY): Payer: HRSA Program

## 2019-02-06 ENCOUNTER — Other Ambulatory Visit: Payer: Self-pay

## 2019-02-06 ENCOUNTER — Inpatient Hospital Stay (HOSPITAL_COMMUNITY): Payer: HRSA Program

## 2019-02-06 ENCOUNTER — Inpatient Hospital Stay (HOSPITAL_COMMUNITY)
Admission: EM | Admit: 2019-02-06 | Discharge: 2019-02-14 | DRG: 177 | Disposition: A | Discharge status: Home / Self Care | Payer: HRSA Program | Attending: Internal Medicine | Admitting: Internal Medicine

## 2019-02-06 ENCOUNTER — Encounter (HOSPITAL_COMMUNITY): Payer: Self-pay

## 2019-02-06 DIAGNOSIS — G4733 Obstructive sleep apnea (adult) (pediatric): Secondary | ICD-10-CM | POA: Diagnosis present

## 2019-02-06 DIAGNOSIS — R7989 Other specified abnormal findings of blood chemistry: Secondary | ICD-10-CM | POA: Diagnosis not present

## 2019-02-06 DIAGNOSIS — E119 Type 2 diabetes mellitus without complications: Secondary | ICD-10-CM | POA: Diagnosis present

## 2019-02-06 DIAGNOSIS — E662 Morbid (severe) obesity with alveolar hypoventilation: Secondary | ICD-10-CM | POA: Diagnosis present

## 2019-02-06 DIAGNOSIS — R0602 Shortness of breath: Secondary | ICD-10-CM | POA: Diagnosis not present

## 2019-02-06 DIAGNOSIS — Z87891 Personal history of nicotine dependence: Secondary | ICD-10-CM | POA: Diagnosis not present

## 2019-02-06 DIAGNOSIS — J80 Acute respiratory distress syndrome: Secondary | ICD-10-CM | POA: Diagnosis not present

## 2019-02-06 DIAGNOSIS — N179 Acute kidney failure, unspecified: Secondary | ICD-10-CM | POA: Diagnosis not present

## 2019-02-06 DIAGNOSIS — E1169 Type 2 diabetes mellitus with other specified complication: Secondary | ICD-10-CM | POA: Diagnosis not present

## 2019-02-06 DIAGNOSIS — Z6841 Body Mass Index (BMI) 40.0 and over, adult: Secondary | ICD-10-CM | POA: Diagnosis not present

## 2019-02-06 DIAGNOSIS — U071 COVID-19: Secondary | ICD-10-CM | POA: Diagnosis not present

## 2019-02-06 DIAGNOSIS — E871 Hypo-osmolality and hyponatremia: Secondary | ICD-10-CM | POA: Diagnosis not present

## 2019-02-06 DIAGNOSIS — Z86711 Personal history of pulmonary embolism: Secondary | ICD-10-CM | POA: Diagnosis not present

## 2019-02-06 DIAGNOSIS — E875 Hyperkalemia: Secondary | ICD-10-CM | POA: Diagnosis not present

## 2019-02-06 DIAGNOSIS — Z7984 Long term (current) use of oral hypoglycemic drugs: Secondary | ICD-10-CM | POA: Diagnosis not present

## 2019-02-06 DIAGNOSIS — E785 Hyperlipidemia, unspecified: Secondary | ICD-10-CM | POA: Diagnosis present

## 2019-02-06 DIAGNOSIS — R918 Other nonspecific abnormal finding of lung field: Secondary | ICD-10-CM | POA: Diagnosis not present

## 2019-02-06 DIAGNOSIS — J9601 Acute respiratory failure with hypoxia: Secondary | ICD-10-CM | POA: Diagnosis present

## 2019-02-06 DIAGNOSIS — R0603 Acute respiratory distress: Secondary | ICD-10-CM | POA: Diagnosis not present

## 2019-02-06 DIAGNOSIS — E669 Obesity, unspecified: Secondary | ICD-10-CM

## 2019-02-06 DIAGNOSIS — J1289 Other viral pneumonia: Secondary | ICD-10-CM | POA: Diagnosis present

## 2019-02-06 DIAGNOSIS — Z7901 Long term (current) use of anticoagulants: Secondary | ICD-10-CM | POA: Diagnosis not present

## 2019-02-06 DIAGNOSIS — I5033 Acute on chronic diastolic (congestive) heart failure: Secondary | ICD-10-CM | POA: Diagnosis present

## 2019-02-06 DIAGNOSIS — Z79899 Other long term (current) drug therapy: Secondary | ICD-10-CM

## 2019-02-06 DIAGNOSIS — I10 Essential (primary) hypertension: Secondary | ICD-10-CM | POA: Diagnosis not present

## 2019-02-06 DIAGNOSIS — I480 Paroxysmal atrial fibrillation: Secondary | ICD-10-CM | POA: Diagnosis not present

## 2019-02-06 DIAGNOSIS — R778 Other specified abnormalities of plasma proteins: Secondary | ICD-10-CM

## 2019-02-06 DIAGNOSIS — I110 Hypertensive heart disease with heart failure: Secondary | ICD-10-CM | POA: Diagnosis present

## 2019-02-06 DIAGNOSIS — R55 Syncope and collapse: Secondary | ICD-10-CM | POA: Diagnosis not present

## 2019-02-06 DIAGNOSIS — R0902 Hypoxemia: Secondary | ICD-10-CM | POA: Diagnosis not present

## 2019-02-06 DIAGNOSIS — R0600 Dyspnea, unspecified: Secondary | ICD-10-CM

## 2019-02-06 HISTORY — DX: Other pulmonary embolism without acute cor pulmonale: I26.99

## 2019-02-06 HISTORY — DX: Other specified abnormalities of plasma proteins: R77.8

## 2019-02-06 HISTORY — DX: Other specified abnormal findings of blood chemistry: R79.89

## 2019-02-06 LAB — COMPREHENSIVE METABOLIC PANEL
ALT: 33 U/L (ref 0–44)
AST: 30 U/L (ref 15–41)
Albumin: 3.4 g/dL — ABNORMAL LOW (ref 3.5–5.0)
Alkaline Phosphatase: 44 U/L (ref 38–126)
Anion gap: 13 (ref 5–15)
BUN: 14 mg/dL (ref 6–20)
CO2: 21 mmol/L — ABNORMAL LOW (ref 22–32)
Calcium: 8.8 mg/dL — ABNORMAL LOW (ref 8.9–10.3)
Chloride: 102 mmol/L (ref 98–111)
Creatinine, Ser: 1.18 mg/dL (ref 0.61–1.24)
GFR calc Af Amer: >60 mL/min (ref >60)
GFR calc non Af Amer: >60 mL/min (ref >60)
Glucose, Bld: 80 mg/dL (ref 70–99)
Potassium: 4.9 mmol/L (ref 3.5–5.1)
Sodium: 136 mmol/L (ref 135–145)
Total Bilirubin: 0.5 mg/dL (ref 0.3–1.2)
Total Protein: 7.8 g/dL (ref 6.5–8.1)

## 2019-02-06 LAB — CBC WITH DIFFERENTIAL/PLATELET
Abs Immature Granulocytes: 0.02 10*3/uL (ref 0.00–0.07)
Basophils Absolute: 0 10*3/uL (ref 0.0–0.1)
Basophils Relative: 0 %
Eosinophils Absolute: 0.1 10*3/uL (ref 0.0–0.5)
Eosinophils Relative: 2 %
HCT: 43.2 % (ref 39.0–52.0)
Hemoglobin: 13 g/dL (ref 13.0–17.0)
Immature Granulocytes: 0 %
Lymphocytes Relative: 35 %
Lymphs Abs: 2 10*3/uL (ref 0.7–4.0)
MCH: 24.4 pg — ABNORMAL LOW (ref 26.0–34.0)
MCHC: 30.1 g/dL (ref 30.0–36.0)
MCV: 81.1 fL (ref 80.0–100.0)
Monocytes Absolute: 0.7 10*3/uL (ref 0.1–1.0)
Monocytes Relative: 12 %
Neutro Abs: 2.8 10*3/uL (ref 1.7–7.7)
Neutrophils Relative %: 51 %
Platelets: 298 10*3/uL (ref 150–400)
RBC: 5.33 MIL/uL (ref 4.22–5.81)
RDW: 15.5 % (ref 11.5–15.5)
WBC: 5.6 10*3/uL (ref 4.0–10.5)
nRBC: 0 % (ref 0.0–0.2)

## 2019-02-06 LAB — PROCALCITONIN: Procalcitonin: <0.1 ng/mL

## 2019-02-06 LAB — SARS CORONAVIRUS 2 BY RT PCR (HOSPITAL ORDER, PERFORMED IN ~~LOC~~ HOSPITAL LAB): SARS Coronavirus 2: POSITIVE — AB

## 2019-02-06 LAB — BRAIN NATRIURETIC PEPTIDE: B Natriuretic Peptide: 15.8 pg/mL (ref 0.0–100.0)

## 2019-02-06 LAB — GLUCOSE, CAPILLARY: Glucose-Capillary: 76 mg/dL (ref 70–99)

## 2019-02-06 LAB — PROTIME-INR
INR: 1.5 — ABNORMAL HIGH (ref 0.8–1.2)
Prothrombin Time: 17.5 seconds — ABNORMAL HIGH (ref 11.4–15.2)

## 2019-02-06 LAB — TROPONIN I
Troponin I: 0.11 ng/mL (ref <0.03)
Troponin I: 0.13 ng/mL (ref <0.03)

## 2019-02-06 LAB — FERRITIN: Ferritin: 111 ng/mL (ref 24–336)

## 2019-02-06 LAB — CBG MONITORING, ED: Glucose-Capillary: 86 mg/dL (ref 70–99)

## 2019-02-06 LAB — LACTATE DEHYDROGENASE: LDH: 248 U/L — ABNORMAL HIGH (ref 98–192)

## 2019-02-06 LAB — C-REACTIVE PROTEIN: CRP: 3.5 mg/dL — ABNORMAL HIGH (ref <1.0)

## 2019-02-06 LAB — D-DIMER, QUANTITATIVE: D-Dimer, Quant: 0.35 ug/mL-FEU (ref 0.00–0.50)

## 2019-02-06 LAB — ABO/RH: ABO/RH(D): O POS

## 2019-02-06 LAB — SEDIMENTATION RATE: Sed Rate: 12 mm/hr (ref 0–16)

## 2019-02-06 MED ORDER — SODIUM CHLORIDE 0.9% FLUSH: 3.0000 mL | INTRAVENOUS | Status: DC | PRN

## 2019-02-06 MED ORDER — ATORVASTATIN CALCIUM 40 MG PO TABS
40.0000 mg | ORAL_TABLET | Freq: Every day | ORAL | Status: DC
  Administered 2019-02-07 – 2019-02-13 (×8): 40 mg via ORAL
  Filled 2019-02-06 (×8): qty 1

## 2019-02-06 MED ORDER — FUROSEMIDE 80 MG PO TABS
80.0000 mg | ORAL_TABLET | Freq: Three times a day (TID) | ORAL | Status: DC
  Administered 2019-02-07: 80 mg via ORAL
  Filled 2019-02-06: qty 1

## 2019-02-06 MED ORDER — PANTOPRAZOLE SODIUM 40 MG PO TBEC
40.0000 mg | DELAYED_RELEASE_TABLET | Freq: Every day | ORAL | Status: DC
  Administered 2019-02-07 – 2019-02-14 (×8): 40 mg via ORAL
  Filled 2019-02-06 (×8): qty 1

## 2019-02-06 MED ORDER — SPIRONOLACTONE 25 MG PO TABS
50.0000 mg | ORAL_TABLET | Freq: Every day | ORAL | Status: DC
  Administered 2019-02-07 – 2019-02-09 (×3): 50 mg via ORAL
  Filled 2019-02-06 (×4): qty 2

## 2019-02-06 MED ORDER — ENOXAPARIN SODIUM 300 MG/3ML IJ SOLN
1.0000 mg/kg | Freq: Two times a day (BID) | INTRAMUSCULAR | Status: DC
  Administered 2019-02-06 – 2019-02-07 (×2): 188 mg via SUBCUTANEOUS
  Filled 2019-02-06 (×6): qty 1.88

## 2019-02-06 MED ORDER — SODIUM CHLORIDE 0.9 % IV SOLN: 250.0000 mL | INTRAVENOUS | Status: DC | PRN

## 2019-02-06 MED ORDER — INSULIN ASPART 100 UNIT/ML ~~LOC~~ SOLN
0.0000 [IU] | Freq: Three times a day (TID) | SUBCUTANEOUS | Status: DC
  Administered 2019-02-11: 17:00:00 1 [IU] via SUBCUTANEOUS
  Administered 2019-02-12: 3 [IU] via SUBCUTANEOUS

## 2019-02-06 MED ORDER — ORAL CARE MOUTH RINSE
15.0000 mL | Freq: Two times a day (BID) | OROMUCOSAL | Status: DC
  Administered 2019-02-06 – 2019-02-14 (×14): 15 mL via OROMUCOSAL

## 2019-02-06 MED ORDER — LABETALOL HCL 200 MG PO TABS
200.0000 mg | ORAL_TABLET | Freq: Every day | ORAL | Status: DC
  Administered 2019-02-07 – 2019-02-14 (×8): 200 mg via ORAL
  Filled 2019-02-06 (×9): qty 1

## 2019-02-06 MED ORDER — SODIUM CHLORIDE 0.9% FLUSH
3.0000 mL | Freq: Two times a day (BID) | INTRAVENOUS | Status: DC
  Administered 2019-02-06 – 2019-02-08 (×4): 3 mL via INTRAVENOUS

## 2019-02-06 MED ORDER — POLYVINYL ALCOHOL 1.4 % OP SOLN
1.0000 [drp] | Freq: Two times a day (BID) | OPHTHALMIC | Status: DC
  Administered 2019-02-07: 2 [drp] via OPHTHALMIC
  Administered 2019-02-07 – 2019-02-08 (×3): 1 [drp] via OPHTHALMIC
  Administered 2019-02-08: 2 [drp] via OPHTHALMIC
  Administered 2019-02-09 – 2019-02-11 (×5): 1 [drp] via OPHTHALMIC
  Administered 2019-02-11: 2 [drp] via OPHTHALMIC
  Administered 2019-02-12 – 2019-02-13 (×3): 1 [drp] via OPHTHALMIC
  Administered 2019-02-13: 2 [drp] via OPHTHALMIC
  Administered 2019-02-14: 09:00:00 1 [drp] via OPHTHALMIC
  Filled 2019-02-06 (×3): qty 15

## 2019-02-06 MED ORDER — LISINOPRIL 20 MG PO TABS
40.0000 mg | ORAL_TABLET | Freq: Every day | ORAL | Status: DC
  Administered 2019-02-07: 40 mg via ORAL
  Filled 2019-02-06: qty 1
  Filled 2019-02-06: qty 2

## 2019-02-06 MED ORDER — IOHEXOL 350 MG/ML SOLN
100.0000 mL | Freq: Once | INTRAVENOUS | Status: AC | PRN
  Administered 2019-02-06: 100 mL via INTRAVENOUS

## 2019-02-06 MED ORDER — INSULIN ASPART 100 UNIT/ML ~~LOC~~ SOLN: 0.0000 [IU] | Freq: Every day | SUBCUTANEOUS | Status: DC

## 2019-02-06 MED ORDER — AMLODIPINE BESYLATE 5 MG PO TABS
10.0000 mg | ORAL_TABLET | Freq: Every day | ORAL | Status: DC
  Administered 2019-02-07 – 2019-02-14 (×8): 10 mg via ORAL
  Filled 2019-02-06 (×6): qty 2
  Filled 2019-02-06: qty 1
  Filled 2019-02-06: qty 2

## 2019-02-06 NOTE — ED Notes (Signed)
IV team at bedside 

## 2019-02-06 NOTE — ED Notes (Signed)
ED TO INPATIENT HANDOFF REPORT  ED Nurse Name and Phone #:   S Name/Age/Gender Benjamin [name] 53 y.o. male Room/Bed: 027C/027C  Code Status   Code Status: Prior  Home/SNF/Other Home Patient oriented to: self, place, time, situation Is this baseline? Yes   Triage Complete: Triage complete  Chief Complaint syncope  Triage Note Pt arrived via [facility]; pt fromhome with difficulty breathing and c/o syncopal episodes x last several days; Pt also c/o of abd pain; pt recently dx with blood clots in lungs and taking lovenox and coumadin; Pt has hx of HTN, DM; 184/90; 90; 24; 95% on 4L    Allergies No Known Allergies  Level of Care/Admitting Diagnosis ED Disposition    ED Disposition Condition Comment   Admit  Hospital Area: [facility] [[id number]]  Level of Care: Telemetry [5]  Covid Evaluation: Confirmed COVID Positive  Isolation Risk Level: Low Risk/Droplet (Less than 4L [place] supplementation)  Diagnosis: Dyspnea [161096]  Admitting Physician: Pearson Grippe [[id number]]  Attending Physician: Pearson Grippe [[id number]]  PT Class (Do Not Modify): Observation [104]  PT Acc Code (Do Not Modify): Observation [10022]       B Medical/Surgery History Past Medical History:  Diagnosis Date  . Diabetes mellitus without complication (HCC)   . Hypertension   . Obesity   . Pulmonary embolism (HCC)   . Sleep apnea    History reviewed. No pertinent surgical history.   A IV Location/Drains/Wounds Patient Lines/Drains/Airways Status   Active Line/Drains/Airways    Name:   Placement date:   Placement time:   Site:   Days:   Peripheral IV 02/06/19 Right Hand   02/06/19    1223    Hand   less than 1   Peripheral IV 02/06/19 Right;Upper Arm   02/06/19    1528    Arm   less than 1          Intake/Output Last 24 hours No intake or output data in the 24 hours ending 02/06/19 1848  Labs/Imaging Results for orders placed or performed during the hospital encounter of 02/06/19 (from  the past 48 hour(s))  CBC WITH DIFFERENTIAL     Status: Abnormal   Collection Time: 02/06/19 11:56 AM  Result Value Ref Range   WBC 5.6 4.0 - 10.5 K/uL   RBC 5.33 4.22 - 5.81 MIL/uL   Hemoglobin 13.0 13.0 - 17.0 g/dL   HCT 04.5 40.9 - 81.1 %   MCV 81.1 80.0 - 100.0 fL   MCH 24.4 (L) 26.0 - 34.0 pg   MCHC 30.1 30.0 - 36.0 g/dL   RDW 91.4 78.2 - 95.6 %   Platelets 298 150 - 400 K/uL   nRBC 0.0 0.0 - 0.2 %   Neutrophils Relative % 51 %   Neutro Abs 2.8 1.7 - 7.7 K/uL   Lymphocytes Relative 35 %   Lymphs Abs 2.0 0.7 - 4.0 K/uL   Monocytes Relative 12 %   Monocytes Absolute 0.7 0.1 - 1.0 K/uL   Eosinophils Relative 2 %   Eosinophils Absolute 0.1 0.0 - 0.5 K/uL   Basophils Relative 0 %   Basophils Absolute 0.0 0.0 - 0.1 K/uL   Immature Granulocytes 0 %   Abs Immature Granulocytes 0.02 0.00 - 0.07 K/uL    Comment: Performed at [facility], [address] [place], [place] [address]  Comprehensive metabolic panel     Status: Abnormal   Collection Time: 02/06/19 11:56 AM  Result Value Ref  Range   Sodium 136 135 - 145 mmol/L   Potassium 4.9 3.5 - 5.1 mmol/L   Chloride 102 98 - 111 mmol/L   CO2 21 (L) 22 - 32 mmol/L   Glucose, Bld 80 70 - 99 mg/dL   BUN 14 6 - 20 mg/dL   Creatinine, Ser 1.611.18 0.61 - 1.24 mg/dL   Calcium 8.8 (L) 8.9 - 10.3 mg/dL   Total Protein 7.8 6.5 - 8.1 g/dL   Albumin 3.4 (L) 3.5 - 5.0 g/dL   AST 30 15 - 41 U/L   ALT 33 0 - 44 U/L   Alkaline Phosphatase 44 38 - 126 U/L   Total Bilirubin 0.5 0.3 - 1.2 mg/dL   GFR calc non Af Amer >60 >60 mL/min   GFR calc Af Amer >60 >60 mL/min   Anion gap 13 5 - 15    Comment: Performed at [facility], [address] [place], [place] [address]  Protime-INR     Status: Abnormal   Collection Time: 02/06/19 11:56 AM  Result Value Ref Range   Prothrombin Time 17.5 (H) 11.4 - 15.2 seconds   INR 1.5 (H) 0.8 - 1.2    Comment: (NOTE) INR goal varies based on device and disease states. Performed at [facility], [address] [place], [place] [address]   Troponin I - ONCE - STAT     Status: Abnormal   Collection Time: 02/06/19 11:56 AM  Result Value Ref Range   Troponin I 0.11 (HH) <0.03 ng/mL    Comment: CRITICAL RESULT CALLED TO, READ BACK BY AND VERIFIED WITH: Aara Jacquot,T RN @1428  ON [id number] BY FLEMINGS Performed at [facility], [address] [place], [place] [address]   Brain natriuretic peptide     Status: None   Collection Time: 02/06/19 11:58 AM  Result Value Ref Range   B Natriuretic Peptide 15.8 0.0 - 100.0 pg/mL    Comment: Performed at [facility], [address] [place], [place] [address]  CBG monitoring, ED     Status: None   Collection Time: 02/06/19 12:14 PM  Result Value Ref Range   Glucose-Capillary 86 70 - 99 mg/dL  SARS Coronavirus 2 (CEPHEID- Performed in [facility] hospital lab), Hosp Order     Status: Abnormal   Collection Time: 02/06/19  3:30 PM  Result Value Ref Range   SARS Coronavirus 2 POSITIVE (A) NEGATIVE    Comment: RESULT CALLED TO, READ BACK BY AND VERIFIED WITH: Azalee CourseM Nikiah Goin RN [phone] 02/06/19 A BROWNING (NOTE) If result is NEGATIVE SARS-CoV-2 target nucleic acids are NOT DETECTED. The SARS-CoV-2 RNA is generally detectable in upper and lower  respiratory specimens during the acute phase of infection. The lowest  concentration of SARS-CoV-2 viral copies this assay can detect is 250  copies / mL. A negative result does not preclude SARS-CoV-2 infection  and should not be used as the sole basis for treatment or other  patient management decisions.  A negative result may occur with  improper specimen collection / handling, submission of specimen other  than nasopharyngeal swab, presence of viral mutation(s) within the  areas targeted by this assay, and inadequate number of viral copies  (<250 copies / mL). A negative result must be combined with clinical  observations, patient history, and epidemiological information. If result is  POSITIVE SARS-CoV-2 target nucleic acids are DETECTED. Th e SARS-CoV-2 RNA is generally detectable in upper and lower  respiratory specimens during the  acute phase of infection.  Positive  results are indicative of active infection with SARS-CoV-2.  Clinical  correlation with patient history and other diagnostic information is  necessary to determine patient infection status.  Positive results do  not rule out bacterial infection or co-infection with other viruses. If result is PRESUMPTIVE POSTIVE SARS-CoV-2 nucleic acids MAY BE PRESENT.   A presumptive positive result was obtained on the submitted specimen  and confirmed on repeat testing.  While 2019 novel coronavirus  (SARS-CoV-2) nucleic acids may be present in the submitted sample  additional confirmatory testing may be necessary for epidemiological  and / or clinical management purposes  to differentiate between  SARS-CoV-2 and other Sarbecovirus currently known to infect humans.  If clinically indicated additional testing with an alternate test  methodology [id number]) is  advised. The SARS-CoV-2 RNA is generally  detectable in upper and lower respiratory specimens during the acute  phase of infection. The expected result is Negative. Fact Sheet for Patients:  BoilerBrush.com.cy Fact Sheet for Healthcare Providers: https://pope.com/ This test is not yet approved or cleared by the [place] FDA and has been authorized for detection and/or diagnosis of SARS-CoV-2 by FDA under an Emergency Use Authorization (EUA).  This EUA will remain in effect (meaning this test can be used) for the duration of the COVID-19 declaration under Section 564(b)(1) of the Act, 21 U.S.C. section 360bbb-3(b)(1), unless the authorization is terminated or revoked sooner. Performed at [facility], [address], [place], [place] [address]    Dg Chest 2 View  Result Date: 02/06/2019 CLINICAL  DATA:  Shortness of breath and syncope EXAM: CHEST - 2 VIEW COMPARISON:  Chest radiograph and chest CT Jan 21, 2019 FINDINGS: There is no appreciable edema or consolidation. Heart is slightly enlarged with mild pulmonary venous hypertension. No adenopathy. There is aortic atherosclerosis. No evident bone lesions. IMPRESSION: Pulmonary vascular congestion. No frank edema or consolidation. Aortic Atherosclerosis (ICD10-I70.0). Electronically Signed   By: Bretta Bang III M.D.   On: 02/06/2019 13:05   Ct Angio Chest Pe W And/or Wo Contrast  Result Date: 02/06/2019 CLINICAL DATA:  Shortness of breath. Recent pulmonary embolus. Subtherapeutic INR. EXAM: CT ANGIOGRAPHY CHEST WITH CONTRAST TECHNIQUE: Multidetector CT imaging of the chest was performed using the standard protocol during bolus administration of intravenous contrast. Multiplanar CT image reconstructions and MIPs were obtained to evaluate the vascular anatomy. CONTRAST:  OMNIPAQUE IOHEXOL 350 MG/ML SOLN COMPARISON:  CTA chest 01/21/2019 FINDINGS: Cardiovascular: --Pulmonary arteries: Satisfactory contrast bolus. Beam attenuation caused by patient body habitus limits assessment beyond the proximal segmental level.Within that limitation, there is no central pulmonary embolus. The bilateral lower lobe emboli visualized previously are not visible on the current study. The main pulmonary artery is mildly enlarged, measuring 3.6 cm. --Aorta: Limited opacification of the aorta due to bolus timing optimization for the pulmonary arteries. Conventional 3 vessel aortic branching pattern. The aortic course and caliber are normal. There is no aortic atherosclerosis. --Heart: Mild cardiomegaly. No pericardial effusion. Mediastinum/Nodes: No mediastinal, hilar or axillary lymphadenopathy. The visualized thyroid and thoracic esophageal course are unremarkable. Lungs/Pleura: Multifocal ground-glass opacities throughout both lungs. No pleural effusion or  pneumothorax. Upper Abdomen: Contrast bolus timing is not optimized for evaluation of the abdominal organs. Within this limitation, the visualized organs of the upper abdomen are normal. Musculoskeletal: No chest wall abnormality. No acute or significant osseous findings. Review of the MIP images confirms the above findings. IMPRESSION: 1. Sensitivity limited by beam attenuation due to  body habitus. No central pulmonary embolus. Previously identified emboli are not clearly visible on today's study. 2. Multifocal bilateral ground-glass opacity. This finding may be associated with viral pneumonia (COVID-19 or otherwise). Multifocal small embolic infarcts are also a consideration. 3. Mild cardiomegaly and enlarged main pulmonary artery, suggesting pulmonary hypertension. Electronically Signed   By: Deatra Robinson M.D.   On: 02/06/2019 17:39    Pending Labs Unresulted Labs (From admission, onward)    Start     Ordered   02/06/19 1157  Urinalysis, Routine w reflex microscopic  ONCE - STAT,   STAT     02/06/19 1157          Vitals/Pain Today's Vitals   02/06/19 1733 02/06/19 1800 02/06/19 1803 02/06/19 1825  BP: 128/76 (!) 153/96    Pulse: 90 86 89 92  Resp: 13 20 (!) 26 (!) 23  Temp:      TempSrc:      SpO2: 97% 92% 98% 99%    Isolation Precautions Droplet and Contact precautions  Medications Medications  iohexol (OMNIPAQUE) 350 MG/ML injection 100 mL (100 mLs Intravenous Contrast Given 02/06/19 1726)    Mobility walks Low fall risk   Focused Assessments Pulmonary Assessment Handoff:  Lung sounds: L Breath Sounds: Diminished R Breath Sounds: Diminished O2 Device: Nasal Cannula O2 Flow Rate (L/min): 2 L/min      R Recommendations: See Admitting Provider Note  Report given to:   Additional Notes:

## 2019-02-06 NOTE — ED Notes (Signed)
Attempted report 

## 2019-02-06 NOTE — ED Notes (Signed)
Patient transported to X-ray 

## 2019-02-06 NOTE — ED Notes (Signed)
Attempted report x 1.  RN unable to take report.

## 2019-02-06 NOTE — Progress Notes (Signed)
ANTICOAGULATION CONSULT NOTE - Initial Consult  Pharmacy Consult for warfarin Indication: VTE  No Known Allergies  Patient Measurements: Height: 5\' 11"  (180.3 cm) Weight: (!) 414 lb 7.4 oz (188 kg) IBW/kg (Calculated) : 75.3  Vital Signs: Temp: 98.7 F (37.1 C) (05/20 2041) Temp Source: Oral (05/20 2041) BP: 159/104 (05/20 2100) Pulse Rate: 90 (05/20 2041)  Labs: Recent Labs    02/06/19 1156  HGB 13.0  HCT 43.2  PLT 298  LABPROT 17.5*  INR 1.5*  CREATININE 1.18  TROPONINI 0.11*    Estimated Creatinine Clearance: 123.3 mL/min (by C-G formula based on SCr of 1.18 mg/dL).   Medical History: Past Medical History:  Diagnosis Date  . Diabetes mellitus without complication (HCC)   . Hypertension   . Obesity   . Pulmonary embolism (HCC)   . Sleep apnea      Assessment: 72 yoM with recent acute PE discharged on enoxaparin and warfarin. INR subtherapeutic today at 1.5, enoxaparin bridge already ordered. Pt reports taking warfarin already today so will hold further doses tonight and redose tomorrow.  Goal of Therapy:  INR 2-3 Monitor platelets by anticoagulation protocol: Yes   Plan:  -Hold further warfarin tonight -INR tomorrow am   Fredonia Highland, PharmD, BCPS Clinical Pharmacist [phone] Please check AMION for all [facility] Pharmacy numbers 02/06/2019

## 2019-02-06 NOTE — ED Triage Notes (Signed)
Pt arrived via [facility]; pt fromhome with difficulty breathing and c/o syncopal episodes x last several days; Pt also c/o of abd pain; pt recently dx with blood clots in lungs and taking lovenox and coumadin; Pt has hx of HTN, DM; 184/90; 90; 24; 95% on 4L

## 2019-02-06 NOTE — ED Notes (Signed)
.  navcom

## 2019-02-06 NOTE — ED Notes (Signed)
Ambulated pt in hallway O2 sats ranged from 88-98.

## 2019-02-06 NOTE — ED Provider Notes (Signed)
[facility]   CSN: [id number] Arrival date & time: 02/06/19  1046    History   Chief Complaint Chief Complaint  Patient presents with   Respiratory Distress   Loss of Consciousness    HPI Benjamin [name] is a 53 y.o. male with history of morbid obesity, diabetes, hypertension, hyperlipidemia, OSA on CPAP, recent hospital admission 5/4-5/7 for acute bilateral PE on Lovenox 190 BID and warfarin 15 mg bridge, elevated troponin, and acute on chronic diastolic HF (EF 60 to 65%) presents to the ED for evaluation of "same thing".  Thinks his clots are back.  Reports he has been feeling like this ever since leaving the hospital but symptoms got worse yesterday which prompted him to come to the ED.  Reports ongoing shortness of breath at rest but worse with exertion and laying flat, better if he sits up.  Associated with generalized weakness, feeling like he is going to faint, 2 episodes of syncope, persistent dry cough.  States since discharge he has had 2 syncopal episodes.  Both times he had just showered and felt like the hot steam made him more short of breath, he was sitting down and just fainted in the chair.  Denies any actual fall to the ground or injuries from the syncope.  When he ambulates he feels like he is weak and lightheaded and like he is going to pass out.  This gets better when he rests.  Has been trying to be compliant with his Lovenox shots, states that he has been doing the "150s" shots but the pharmacy did not give him the "40s" shots.  Has been taking warfarin as prescribed.  Reports associated abdominal "burning" to the lower abdominal skin that is tender to touch, slightly red and warm.  He has felt two tender knots in the lower abdomen, in area where he injects lovenox.  Compliant with diuretics.  Does not weigh himself at home.  Compliant with CPAP but does not usually use O2 at home.  Denies associated fever, chills,  sore throat, congestion, nausea, vomiting, diarrhea, chest pain.     HPI  Past Medical History:  Diagnosis Date   Diabetes mellitus without complication (HCC)    Hypertension    Obesity    Pulmonary embolism (HCC)    Sleep apnea     Patient Active Problem List   Diagnosis Date Noted   Dyspnea 02/06/2019   COVID-19 virus infection 02/06/2019   Elevated troponin 02/06/2019   Bilateral lower extremity edema    Obesity, Class III, BMI 40-49.9 (morbid obesity) (HCC)    Acute on chronic diastolic CHF (congestive heart failure) (HCC)    PAF (paroxysmal atrial fibrillation) (HCC)    Acute pulmonary embolism (HCC) 01/21/2019   Hypertensive urgency 01/21/2019   Diabetes mellitus type 2 in obese (HCC) 01/21/2019    History reviewed. No pertinent surgical history.      Home Medications    Prior to Admission medications   Medication Sig Start Date End Date Taking? Authorizing Provider  amLODipine (NORVASC) 10 MG tablet Take 10 mg by mouth daily. 01/03/19  Yes [provider]  atorvastatin (LIPITOR) 40 MG tablet Take 40 mg by mouth at bedtime.  01/03/19  Yes [provider]  furosemide (LASIX) 80 MG tablet Take 80 mg by mouth 3 (three) times daily. 01/03/19  Yes [provider]  Glycerin-Hypromellose-PEG 400 (VISINE DRY EYE) 0.2-0.2-1 % SOLN Place 1-2 drops into both eyes 2 (two) times  a day.   Yes [provider]  labetalol (NORMODYNE) 200 MG tablet Take 200 mg by mouth daily. 01/03/19  Yes [provider]  lisinopril (ZESTRIL) 40 MG tablet Take 40 mg by mouth daily. 01/03/19  Yes [provider]  metFORMIN (GLUCOPHAGE) 500 MG tablet Take 500 mg by mouth 2 (two) times a day. 01/03/19  Yes [provider]  omeprazole (PRILOSEC) 40 MG capsule Take 40 mg by mouth daily before breakfast. 01/03/19  Yes [provider]  spironolactone (ALDACTONE) 50 MG tablet Take 50 mg by mouth daily. 01/03/19  Yes [provider]  Throat Lozenges (COUGH DROPS MT) 1 drop as needed (for throat dryness).   Yes [provider]  triamcinolone cream (KENALOG) 0.1 % Apply 1 application topically 3 (three) times daily. 02/01/19  Yes [provider]  warfarin (COUMADIN) 7.5 MG tablet Take 2 tablets (15 mg total) by mouth daily after breakfast for 30 days. (please follow up at appointment on Saturday to determine if dose needs to be changed) 01/24/19 02/23/19 Yes Zigmund Daniel., MD  enoxaparin (LOVENOX) 150 MG/ML injection Inject 1 mL (150 mg total) into the skin every 12 (twelve) hours for 7 days. (until INR between 2-3, then discontinue) 01/24/19 01/31/19  Zigmund Daniel., MD  enoxaparin (LOVENOX) 40 MG/0.4ML injection Inject 0.4 mLs (40 mg total) into the skin every 12 (twelve) hours for 7 days. (until INR between 2-3, then discontinue) 01/24/19 01/31/19  Zigmund Daniel., MD    Family History History reviewed. No pertinent family history.  Social History Social History   Tobacco Use   Smoking status: Former Smoker   Smokeless tobacco: Never Used  Substance Use Topics   Alcohol use: Yes   Drug use: Not Currently     Allergies   Patient has no known allergies.   Review of Systems Review of Systems  Respiratory: Positive for cough and shortness of breath.   Skin: Positive for color change (redness to lower abdominal pain, tender).  Neurological: Positive for syncope and weakness.  Hematological: Bruises/bleeds easily.  All other systems reviewed and are negative.    Physical Exam Updated Vital Signs BP (!) 144/87 (BP Location: Right Arm)    Pulse 90    Temp 98.7 F (37.1 C) (Oral)    Resp 19    SpO2 93%   Physical Exam Vitals signs and nursing note reviewed.  Constitutional:      Appearance: He is well-developed.     Comments: Found on 4 L [place] at 96%.  Stopped [place] during evaluation to determine oxygen demands on RA at rest.  Morbidly obese man. NAD  HENT:      Head: Normocephalic and atraumatic.     Nose: Nose normal.     Mouth/Throat:     Comments: MMM Eyes:     Conjunctiva/sclera: Conjunctivae normal.  Neck:     Musculoskeletal: Normal range of motion.  Cardiovascular:     Rate and Rhythm: Normal rate and regular rhythm.     Heart sounds: Normal heart sounds.     Comments: 1+ radial and DP pulses bilaterally. Trace pitting edema to ankles symmetric. No calf tenderness.  Pulmonary:     Effort: Pulmonary effort is normal.     Breath sounds: Rales present.     Comments: Expiratory coarse lung sounds to mid/lower lobes, subtle increased WOB with just sitting up in bed.  SpO2 drops to 90-92% while taking on RA. Abdominal:  General: Bowel sounds are normal.     Palpations: Abdomen is soft.     Tenderness: There is no abdominal tenderness.     Comments: No G/R/R. No suprapubic or CVA tenderness.  No frank abd tenderness. Overlaying lower abd skin is slightly erythematous, warm and tender   Musculoskeletal: Normal range of motion.  Skin:    General: Skin is warm and dry.     Capillary Refill: Capillary refill takes less than 2 seconds.     Findings: Erythema present.     Comments: Subtle diffuse erythema to lower abdominal skin, tender with light graze/touch.  Two mildly tender nodule to lower abdominal skin to sides of umbilicus, but no fluctuance, drainage.  Neurological:     Mental Status: He is alert.  Psychiatric:        Behavior: Behavior normal.      ED Treatments / Results  Labs (all labs ordered are listed, but only abnormal results are displayed) Labs Reviewed  SARS CORONAVIRUS 2 (HOSPITAL ORDER, PERFORMED IN [place] [facility]) - Abnormal; Notable for the following components:      Result Value   SARS Coronavirus 2 POSITIVE (*)    All other components within normal limits  CBC WITH DIFFERENTIAL/PLATELET - Abnormal; Notable for the following components:   MCH 24.4 (*)    All other components within normal limits    COMPREHENSIVE METABOLIC PANEL - Abnormal; Notable for the following components:   CO2 21 (*)    Calcium 8.8 (*)    Albumin 3.4 (*)    All other components within normal limits  PROTIME-INR - Abnormal; Notable for the following components:   Prothrombin Time 17.5 (*)    INR 1.5 (*)    All other components within normal limits  TROPONIN I - Abnormal; Notable for the following components:   Troponin I 0.11 (*)    All other components within normal limits  BRAIN NATRIURETIC PEPTIDE  URINALYSIS, ROUTINE W REFLEX MICROSCOPIC  D-DIMER, QUANTITATIVE (NOT AT [facility])  FERRITIN  INTERLEUKIN-6, PLASMA  LACTATE DEHYDROGENASE  SEDIMENTATION RATE  PROCALCITONIN  C-REACTIVE PROTEIN  CBC WITH DIFFERENTIAL/PLATELET  COMPREHENSIVE METABOLIC PANEL  TROPONIN I  TROPONIN I  TROPONIN I  CBG MONITORING, ED  ABO/RH    EKG EKG Interpretation  Date/Time:  Wednesday Feb 06 2019 11:00:59 EDT Ventricular Rate:  92 PR Interval:    QRS Duration: 96 QT Interval:  338 QTC Calculation: 419 R Axis:   76 Text Interpretation:  Sinus rhythm ST elev, probable normal early repol pattern similar pattern to prior 5/20 Confirmed by Meridee Score [phone]) on 02/06/2019 12:28:08 PM   Radiology Dg Chest 2 View  Result Date: 02/06/2019 CLINICAL DATA:  Shortness of breath and syncope EXAM: CHEST - 2 VIEW COMPARISON:  Chest radiograph and chest CT Jan 21, 2019 FINDINGS: There is no appreciable edema or consolidation. Heart is slightly enlarged with mild pulmonary venous hypertension. No adenopathy. There is aortic atherosclerosis. No evident bone lesions. IMPRESSION: Pulmonary vascular congestion. No frank edema or consolidation. Aortic Atherosclerosis (ICD10-I70.0). Electronically Signed   By: Bretta Bang III M.D.   On: 02/06/2019 13:05   Ct Head Wo Contrast  Result Date: 02/06/2019 CLINICAL DATA:  Syncope. EXAM: CT HEAD WITHOUT CONTRAST TECHNIQUE: Contiguous axial images were obtained from the base of the  skull through the vertex without intravenous contrast. COMPARISON:  None. FINDINGS: Brain: No evidence of acute infarction, hemorrhage, hydrocephalus, extra-axial collection or mass lesion/mass effect. Vascular: No hyperdense vessel or unexpected calcification.  Skull: Normal. Negative for fracture or focal lesion. Sinuses/Orbits: No acute finding. Other: None. IMPRESSION: No acute intracranial abnormality. Electronically Signed   By: Katherine Mantle M.D.   On: 02/06/2019 20:49   Ct Angio Chest Pe W And/or Wo Contrast  Result Date: 02/06/2019 CLINICAL DATA:  Shortness of breath. Recent pulmonary embolus. Subtherapeutic INR. EXAM: CT ANGIOGRAPHY CHEST WITH CONTRAST TECHNIQUE: Multidetector CT imaging of the chest was performed using the standard protocol during bolus administration of intravenous contrast. Multiplanar CT image reconstructions and MIPs were obtained to evaluate the vascular anatomy. CONTRAST:  OMNIPAQUE IOHEXOL 350 MG/ML SOLN COMPARISON:  CTA chest 01/21/2019 FINDINGS: Cardiovascular: --Pulmonary arteries: Satisfactory contrast bolus. Beam attenuation caused by patient body habitus limits assessment beyond the proximal segmental level.Within that limitation, there is no central pulmonary embolus. The bilateral lower lobe emboli visualized previously are not visible on the current study. The main pulmonary artery is mildly enlarged, measuring 3.6 cm. --Aorta: Limited opacification of the aorta due to bolus timing optimization for the pulmonary arteries. Conventional 3 vessel aortic branching pattern. The aortic course and caliber are normal. There is no aortic atherosclerosis. --Heart: Mild cardiomegaly. No pericardial effusion. Mediastinum/Nodes: No mediastinal, hilar or axillary lymphadenopathy. The visualized thyroid and thoracic esophageal course are unremarkable. Lungs/Pleura: Multifocal ground-glass opacities throughout both lungs. No pleural effusion or pneumothorax. Upper Abdomen:  Contrast bolus timing is not optimized for evaluation of the abdominal organs. Within this limitation, the visualized organs of the upper abdomen are normal. Musculoskeletal: No chest wall abnormality. No acute or significant osseous findings. Review of the MIP images confirms the above findings. IMPRESSION: 1. Sensitivity limited by beam attenuation due to body habitus. No central pulmonary embolus. Previously identified emboli are not clearly visible on today's study. 2. Multifocal bilateral ground-glass opacity. This finding may be associated with viral pneumonia (COVID-19 or otherwise). Multifocal small embolic infarcts are also a consideration. 3. Mild cardiomegaly and enlarged main pulmonary artery, suggesting pulmonary hypertension. Electronically Signed   By: Deatra Robinson M.D.   On: 02/06/2019 17:39    Procedures .Critical Care Performed by: Liberty Handy, PA-C Authorized by: Liberty Handy, PA-C   Critical care provider statement:    Critical care time (minutes):  45   Critical care was necessary to treat or prevent imminent or life-threatening deterioration of the following conditions:  Respiratory failure (hypoxemia)   Critical care was time spent personally by me on the following activities:  Discussions with consultants, evaluation of patient's response to treatment, examination of patient, ordering and performing treatments and interventions, ordering and review of laboratory studies, ordering and review of radiographic studies, pulse oximetry, re-evaluation of patient's condition, obtaining history from patient or surrogate and review of old charts   I assumed direction of critical care for this patient from another provider in my specialty: no     (including critical care time)  Medications Ordered in ED Medications  sodium chloride flush (NS) 0.9 % injection 3 mL (has no administration in time range)  sodium chloride flush (NS) 0.9 % injection 3 mL (has no administration  in time range)  0.9 %  sodium chloride infusion (has no administration in time range)  enoxaparin (LOVENOX) injection 188 mg (has no administration in time range)  iohexol (OMNIPAQUE) 350 MG/ML injection 100 mL (100 mLs Intravenous Contrast Given 02/06/19 1726)     Initial Impression / Assessment and Plan / ED Course  I have reviewed the triage vital signs and the nursing notes.  Pertinent labs &  imaging results that were available during my care of the patient were reviewed by me and considered in my medical decision making (see chart for details).  Clinical Course as of Feb 06 2103  Wed Feb 06, 2019  1319 FINDINGS: There is no appreciable edema or consolidation. Heart is slightly enlarged with mild pulmonary venous hypertension. No adenopathy. There is aortic atherosclerosis. No evident bone lesions.  IMPRESSION: Pulmonary vascular congestion. No frank edema or consolidation. Aortic Atherosclerosis (ICD10-I70.0).  DG Chest 2 View [CG]  536 53 year old male obesity here with syncope and shortness of breath in the setting of a recent diagnosis of PE.  At rest he is breathing comfortably but with any kind of activity picks up his respiratory rate.  He otherwise is nontoxic-appearing.  Initial chest x-ray and lab work is fairly unremarkable other than is subtherapeutic with an INR 1.5.  He potentially will need repeat imaging   [MB]  1435 Troponin I(!!): 0.11 [CG]  1435 Goal of 2-3   INR(!): 1.5 [CG]    Clinical Course User Index [CG] Liberty Handy, PA-C [MB] Terrilee Files, MD  Benjamin Bores was evaluated in Emergency Department on 02/06/2019 for the symptoms described in the history of present illness. He was evaluated in the context of the global COVID-19 pandemic, which necessitated consideration that the patient might be at risk for infection with the SARS-CoV-2 virus that causes COVID-19. Institutional protocols and algorithms that pertain to the evaluation of patients  at risk for COVID-19 are in a state of rapid change based on information released by regulatory bodies including the CDC and federal and state organizations. These policies and algorithms were followed during the patient's care in the ED.    ddx includes new or worsening PE. Clinically he does not look fluid overloaded and has been compliant with lasix, but reports orthopnea and acute on chronic HF is possible.  Central obesity could be contributing to this/hypoventilation syndrome. Has had a cough for weeks and recent hospitalization puts him at risk for COVID vs other URI/LRI, but no fever, tachycardia.  Will obtain labs, EKG, trop, CXR, BNP, reassess. Lower suspicion for ACS, he has no CP and symptoms have been ongoing for several weeks.  Ambulated in ED with drop in SpO2 88%, could be contributing to light-headedness and report of syncope. Does not usually wear oxygen at home.   Final Clinical Impressions(s) / ED Diagnoses   1630: Work up remarkable for elevated trop at baseline, subtherapeutic PT INR. CXR with pulmonary congestion that looks similar to previous. EKG w/o new ischemic changes. Hand off to oncoming EDPA who will f/u on CTA, COVID test. Consider discussion with hospitalist for transient hypoxemia and anticoagulation. Pt updated, evaluated prior to transfer of care with VSS. Discussed with EDMD. Will need close monitor of abdominal skin irritation vs early cellulitis.   Final diagnoses:  Respiratory distress  COVID-19 virus infection  Acute respiratory failure with hypoxia [facility])    ED Discharge Orders    None       Jerrell Mylar 02/06/19 2104    Terrilee Files, MD 02/07/19 [phone]

## 2019-02-06 NOTE — H&P (Signed)
TRH H&P    Patient Demographics:    Benjamin [name], is a 53 y.o. male  MRN: [id number]  DOB - [birth year]  Admit Date - 02/06/2019  Referring MD/NP/PA:   Carmon Sails  Outpatient Primary MD for the patient is System, Pcp Not In  Patient coming from:   home  Chief complaint-   Dyspnea, syncope   HPI:    Benjamin [name]  is a 53 y.o. male, w Dm2, morbid obeisity, recently diagnosed with PE 01/21/19, apparently had brief episode of syncope (lasting less than a minute) , earlier today as well as c/o dyspnea.  Pt denies presyncopal symptoms.  Pt states has been compliant with his medications. Slight cough, yellow sputum, otherwise denies fever, chills, cp, palp, n/v, abd pain, diarrhea, brbpr.  Pt denies any change in taste or smell.    In ED,  T 98.7  P 139  R 27 Bp 179/97  => 159/104, pox 92-100%,  desats to 88% with exertion Wt 188kb  CTA chest IMPRESSION: 1. Sensitivity limited by beam attenuation due to body habitus. No central pulmonary embolus. Previously identified emboli are not clearly visible on today's study. 2. Multifocal bilateral ground-glass opacity. This finding may be associated with viral pneumonia (COVID-19 or otherwise). Multifocal small embolic infarcts are also a consideration. 3. Mild cardiomegaly and enlarged main pulmonary artery, suggesting pulmonary hypertension.  CT brain IMPRESSION: No acute intracranial abnormality.  COVID +++  Wbc 5.6, Hgb 13.0, Plt 298 Na 136, K 4.9, Bun14, Creatinine 1.18 Ast 30, Alt 33 Alk phos 44. T. Bili 0.5  INR 1.5  Trop 0.11 (prev 0.16)  BNP 15.8  Pt given lovenox 51m/ kg [place] x1 in ED  Pt will be admitted for syncope, subtherapeutic INR, n/o PE and now COVID +      Review of systems:    In addition to the HPI above,  No Fever-chills, No Headache, No changes with Vision or hearing, No problems swallowing food or Liquids, No  Chest pain,  No Abdominal pain, No Nausea or Vomiting, bowel movements are regular, No Blood in stool or Urine, No dysuria, No new skin rashes or bruises, No new joints pains-aches,  No new weakness, tingling, numbness in any extremity, No recent weight gain or loss, No polyuria, polydypsia or polyphagia, No significant Mental Stressors.  All other systems reviewed and are negative.    Past History of the following :    Past Medical History:  Diagnosis Date  . Diabetes mellitus without complication ([place]   . Hypertension   . Obesity   . Pulmonary embolism ([place]   . Sleep apnea       History reviewed. No pertinent surgical history. No surgeries in past per patient   Social History:      Social History   Tobacco Use  . Smoking status: Former SResearch scientist (life sciences) . Smokeless tobacco: Never Used  Substance Use Topics  . Alcohol use: Yes       Family History :     Family History  Problem Relation Age of  Onset  . Hypertension Mother       Home Medications:   Prior to Admission medications   Medication Sig Start Date End Date Taking? Authorizing Provider  amLODipine (NORVASC) 10 MG tablet Take 10 mg by mouth daily. 01/03/19  Yes [provider]  atorvastatin (LIPITOR) 40 MG tablet Take 40 mg by mouth at bedtime.  01/03/19  Yes [provider]  furosemide (LASIX) 80 MG tablet Take 80 mg by mouth 3 (three) times daily. 01/03/19  Yes [provider]  Glycerin-Hypromellose-PEG 400 (VISINE DRY EYE) 0.2-0.2-1 % SOLN Place 1-2 drops into both eyes 2 (two) times a day.   Yes [provider]  labetalol (NORMODYNE) 200 MG tablet Take 200 mg by mouth daily. 01/03/19  Yes [provider]  lisinopril (ZESTRIL) 40 MG tablet Take 40 mg by mouth daily. 01/03/19  Yes [provider]  metFORMIN (GLUCOPHAGE) 500 MG tablet Take 500 mg by mouth 2 (two) times a day. 01/03/19  Yes [provider]  omeprazole (PRILOSEC) 40 MG capsule Take 40  mg by mouth daily before breakfast. 01/03/19  Yes [provider]  spironolactone (ALDACTONE) 50 MG tablet Take 50 mg by mouth daily. 01/03/19  Yes [provider]  Throat Lozenges (COUGH DROPS MT) 1 drop as needed (for throat dryness).   Yes [provider]  triamcinolone cream (KENALOG) 0.1 % Apply 1 application topically 3 (three) times daily. 02/01/19  Yes [provider]  warfarin (COUMADIN) 7.5 MG tablet Take 2 tablets (15 mg total) by mouth daily after breakfast for 30 days. (please follow up at appointment on Saturday to determine if dose needs to be changed) 01/24/19 02/23/19 Yes Elodia Florence., MD  enoxaparin (LOVENOX) 150 MG/ML injection Inject 1 mL (150 mg total) into the skin every 12 (twelve) hours for 7 days. (until INR between 2-3, then discontinue) 01/24/19 01/31/19  Elodia Florence., MD  enoxaparin (LOVENOX) 40 MG/0.4ML injection Inject 0.4 mLs (40 mg total) into the skin every 12 (twelve) hours for 7 days. (until INR between 2-3, then discontinue) 01/24/19 01/31/19  Elodia Florence., MD     Allergies:    No Known Allergies   Physical Exam:   Vitals  Blood pressure (!) 159/104, pulse 90, temperature 98.7 F (37.1 C), temperature source Oral, resp. rate 19, height '5\' 11"'  (1.803 m), weight (!) 188 kg, SpO2 93 %.  1.  General: axox3  2. Psychiatric: euthymic  3. Neurologic: cn2-12 intact, reflexes 2+ symmetric, diffuse with no clonus, motor 5/5 in all 4 ext  4. HEENMT:  Anicteric, pupils 1.53m symmetric, direct consensual, near intact Mmm Neck: no jvd  5. Respiratory : Slight crackles bilateral base, no wheezing  6. Cardiovascular : rrr s1, s2, no m/g/r  7. Gastrointestinal:  Abd: morbidly obese, soft, nt, nd, +bs  8. Skin:  Ext: no c/c/e, no rash  9.Musculoskeletal:  Good ROM  No adenopathy    Data Review:    CBC Recent Labs  Lab 02/06/19 1156  WBC 5.6  HGB 13.0  HCT 43.2  PLT 298  MCV 81.1  MCH  24.4*  MCHC 30.1  RDW 15.5  LYMPHSABS 2.0  MONOABS 0.7  EOSABS 0.1  BASOSABS 0.0   ------------------------------------------------------------------------------------------------------------------  Results for orders placed or performed during the hospital encounter of 02/06/19 (from the past 48 hour(s))  CBC WITH DIFFERENTIAL     Status: Abnormal   Collection Time: 02/06/19 11:56 AM  Result Value Ref Range  WBC 5.6 4.0 - 10.5 K/uL   RBC 5.33 4.22 - 5.81 MIL/uL   Hemoglobin 13.0 13.0 - 17.0 g/dL   HCT 43.2 39.0 - 52.0 %   MCV 81.1 80.0 - 100.0 fL   MCH 24.4 (L) 26.0 - 34.0 pg   MCHC 30.1 30.0 - 36.0 g/dL   RDW 15.5 11.5 - 15.5 %   Platelets 298 150 - 400 K/uL   nRBC 0.0 0.0 - 0.2 %   Neutrophils Relative % 51 %   Neutro Abs 2.8 1.7 - 7.7 K/uL   Lymphocytes Relative 35 %   Lymphs Abs 2.0 0.7 - 4.0 K/uL   Monocytes Relative 12 %   Monocytes Absolute 0.7 0.1 - 1.0 K/uL   Eosinophils Relative 2 %   Eosinophils Absolute 0.1 0.0 - 0.5 K/uL   Basophils Relative 0 %   Basophils Absolute 0.0 0.0 - 0.1 K/uL   Immature Granulocytes 0 %   Abs Immature Granulocytes 0.02 0.00 - 0.07 K/uL    Comment: Performed at [facility] [address], [place], [place] [address]  Comprehensive metabolic panel     Status: Abnormal   Collection Time: 02/06/19 11:56 AM  Result Value Ref Range   Sodium 136 135 - 145 mmol/L   Potassium 4.9 3.5 - 5.1 mmol/L   Chloride 102 98 - 111 mmol/L   CO2 21 (L) 22 - 32 mmol/L   Glucose, Bld 80 70 - 99 mg/dL   BUN 14 6 - 20 mg/dL   Creatinine, Ser 1.18 0.61 - 1.24 mg/dL   Calcium 8.8 (L) 8.9 - 10.3 mg/dL   Total Protein 7.8 6.5 - 8.1 g/dL   Albumin 3.4 (L) 3.5 - 5.0 g/dL   AST 30 15 - 41 U/L   ALT 33 0 - 44 U/L   Alkaline Phosphatase 44 38 - 126 U/L   Total Bilirubin 0.5 0.3 - 1.2 mg/dL   GFR calc non Af Amer >60 >60 mL/min   GFR calc Af Amer >60 >60 mL/min   Anion gap 13 5 - 15    Comment: Performed at [facility] [address]  [place], [place] [address]  Protime-INR     Status: Abnormal   Collection Time: 02/06/19 11:56 AM  Result Value Ref Range   Prothrombin Time 17.5 (H) 11.4 - 15.2 seconds   INR 1.5 (H) 0.8 - 1.2    Comment: (NOTE) INR goal varies based on device and disease states. Performed at [facility], [place] [address], [place], [place] [address]   Troponin I - ONCE - STAT     Status: Abnormal   Collection Time: 02/06/19 11:56 AM  Result Value Ref Range   Troponin I 0.11 (HH) <0.03 ng/mL    Comment: CRITICAL RESULT CALLED TO, READ BACK BY AND VERIFIED WITH: HEATH,T RN '@[id number]'  ON [id number] BY FLEMINGS Performed at [facility], [place] [address], [place], [place] [address]   Brain natriuretic peptide     Status: None   Collection Time: 02/06/19 11:58 AM  Result Value Ref Range   B Natriuretic Peptide 15.8 0.0 - 100.0 pg/mL    Comment: Performed at [facility] [address], [place], [place] [address]  CBG monitoring, ED     Status: None   Collection Time: 02/06/19 12:14 PM  Result Value Ref Range   Glucose-Capillary 86 70 - 99 mg/dL  SARS Coronavirus 2 (CEPHEID- Performed in [facility] hospital lab), [facility]  Order     Status: Abnormal   Collection Time: 02/06/19  3:30 PM  Result Value Ref Range   SARS Coronavirus 2 POSITIVE (A) NEGATIVE    Comment: RESULT CALLED TO, READ BACK BY AND VERIFIED WITH: Gerrie Nordmann RN [id number] 02/06/19 A BROWNING (NOTE) If result is NEGATIVE SARS-CoV-2 target nucleic acids are NOT DETECTED. The SARS-CoV-2 RNA is generally detectable in upper and lower  respiratory specimens during the acute phase of infection. The lowest  concentration of SARS-CoV-2 viral copies this assay can detect is 250  copies / mL. A negative result does not preclude SARS-CoV-2 infection  and should not be used as the sole basis for treatment or other  patient management decisions.  A negative result may occur with  improper specimen collection / handling, submission of specimen other   than nasopharyngeal swab, presence of viral mutation(s) within the  areas targeted by this assay, and inadequate number of viral copies  (<250 copies / mL). A negative result must be combined with clinical  observations, patient history, and epidemiological information. If result is POSITIVE SARS-CoV-2 target nucleic acids are DETECTED. Th e SARS-CoV-2 RNA is generally detectable in upper and lower  respiratory specimens during the acute phase of infection.  Positive  results are indicative of active infection with SARS-CoV-2.  Clinical  correlation with patient history and other diagnostic information is  necessary to determine patient infection status.  Positive results do  not rule out bacterial infection or co-infection with other viruses. If result is PRESUMPTIVE POSTIVE SARS-CoV-2 nucleic acids MAY BE PRESENT.   A presumptive positive result was obtained on the submitted specimen  and confirmed on repeat testing.  While 2019 novel coronavirus  (SARS-CoV-2) nucleic acids may be present in the submitted sample  additional confirmatory testing may be necessary for epidemiological  and / or clinical management purposes  to differentiate between  SARS-CoV-2 and other Sarbecovirus currently known to infect humans.  If clinically indicated additional testing with an alternate test  methodology [phone]) is  advised. The SARS-CoV-2 RNA is generally  detectable in upper and lower respiratory specimens during the acute  phase of infection. The expected result is Negative. Fact Sheet for Patients:  StrictlyIdeas.no Fact Sheet for Healthcare Providers: BankingDealers.co.za This test is not yet approved or cleared by the [place] FDA and has been authorized for detection and/or diagnosis of SARS-CoV-2 by FDA under an Emergency Use Authorization (EUA).  This EUA will remain in effect (meaning this test can be used) for the duration of the  COVID-19 declaration under Section 564(b)(1) of the Act, 21 U.S.C. section 360bbb-3(b)(1), unless the authorization is terminated or revoked sooner. Performed at [facility], [place] [address], [place], [place] [address]   ABO/Rh     Status: None   Collection Time: 02/06/19  8:58 PM  Result Value Ref Range   ABO/RH(D)      O POS Performed at [facility] [address], [place], [place] [address]   D-dimer, quantitative (not at [facility])     Status: None   Collection Time: 02/06/19  8:58 PM  Result Value Ref Range   D-Dimer, Quant 0.35 0.00 - 0.50 ug/mL-FEU    Comment: (NOTE) At the manufacturer cut-off of 0.50 ug/mL FEU, this assay has been documented to exclude PE with a sensitivity and negative predictive value of 97 to 99%.  At this time, this assay has not been approved by the FDA to exclude DVT/VTE. Results should be  correlated with clinical presentation. Performed at [facility], [place] [address] [place], [place] [address]   Ferritin     Status: None   Collection Time: 02/06/19  8:58 PM  Result Value Ref Range   Ferritin 111 24 - 336 ng/mL    Comment: Performed at [facility] [address], [place], [place] [address]  Lactate dehydrogenase     Status: Abnormal   Collection Time: 02/06/19  8:58 PM  Result Value Ref Range   LDH 248 (H) 98 - 192 U/L    Comment: Performed at [facility], [place] [address] [place], [place] [address]  Sedimentation rate     Status: None   Collection Time: 02/06/19  8:58 PM  Result Value Ref Range   Sed Rate 12 0 - 16 mm/hr    Comment: Performed at [facility] [address], [place], [place] [address]  Procalcitonin     Status: None   Collection Time: 02/06/19  8:58 PM  Result Value Ref Range   Procalcitonin <0.10 ng/mL    Comment:        Interpretation: PCT (Procalcitonin) <= 0.5 ng/mL: Systemic infection (sepsis) is not likely. Local bacterial infection is possible. (NOTE)       Sepsis PCT  Algorithm           Lower Respiratory Tract                                      Infection PCT Algorithm    ----------------------------     ----------------------------         PCT < 0.25 ng/mL                PCT < 0.10 ng/mL         Strongly encourage             Strongly discourage   discontinuation of antibiotics    initiation of antibiotics    ----------------------------     -----------------------------       PCT 0.25 - 0.50 ng/mL            PCT 0.10 - 0.25 ng/mL               OR       >80% decrease in PCT            Discourage initiation of                                            antibiotics      Encourage discontinuation           of antibiotics    ----------------------------     -----------------------------         PCT >= 0.50 ng/mL              PCT 0.26 - 0.50 ng/mL               AND        <80% decrease in PCT             Encourage initiation of  antibiotics       Encourage continuation           of antibiotics    ----------------------------     -----------------------------        PCT >= 0.50 ng/mL                  PCT > 0.50 ng/mL               AND         increase in PCT                  Strongly encourage                                      initiation of antibiotics    Strongly encourage escalation           of antibiotics                                     -----------------------------                                           PCT <= 0.25 ng/mL                                                 OR                                        > 80% decrease in PCT                                     Discontinue / Do not initiate                                             antibiotics Performed at [facility], [address] [place], [place] [address]   C-reactive protein     Status: Abnormal   Collection Time: 02/06/19  8:58 PM  Result Value Ref Range   CRP 3.5 (H) <1.0 mg/dL    Comment: Performed at [facility], [place] [address], [place], [place] [address]  Troponin I - Now Then Q6H     Status: Abnormal   Collection Time: 02/06/19  8:58 PM  Result Value Ref Range   Troponin I 0.13 (HH) <0.03 ng/mL    Comment: CRITICAL VALUE NOTED.  VALUE IS CONSISTENT WITH PREVIOUSLY REPORTED AND CALLED VALUE. Performed at [facility], [place] [address], [place], [place] [address]   Glucose, capillary     Status: None   Collection Time: 02/06/19  9:12 PM  Result Value Ref Range   Glucose-Capillary 76 70 - 99 mg/dL    Chemistries  Recent Labs  Lab 02/06/19 1156  NA 136  K 4.9  CL 102  CO2 21*  GLUCOSE 80  BUN 14  CREATININE 1.18  CALCIUM 8.8*  AST 30  ALT 33  ALKPHOS 44  BILITOT 0.5   ------------------------------------------------------------------------------------------------------------------  ------------------------------------------------------------------------------------------------------------------ GFR: Estimated Creatinine Clearance: 123.3 mL/min (by C-G formula based on SCr of 1.18 mg/dL). Liver Function Tests: Recent Labs  Lab 02/06/19 1156  AST 30  ALT 33  ALKPHOS 44  BILITOT 0.5  PROT 7.8  ALBUMIN 3.4*   No results for input(s): LIPASE, AMYLASE in the last 168 hours. No results for input(s): AMMONIA in the last 168 hours. Coagulation Profile: Recent Labs  Lab 02/06/19 1156  INR 1.5*   Cardiac Enzymes: Recent Labs  Lab 02/06/19 1156 02/06/19 2058  TROPONINI 0.11* 0.13*   BNP (last 3 results) No results for input(s): PROBNP in the last 8760 hours. HbA1C: No results for input(s): HGBA1C in the last 72 hours. CBG: Recent Labs  Lab 02/06/19 1214 02/06/19 2112  GLUCAP 86 76   Lipid Profile: No results for input(s): CHOL, HDL, LDLCALC, TRIG, CHOLHDL, LDLDIRECT in the last 72 hours. Thyroid Function Tests: No results for input(s): TSH, T4TOTAL, FREET4, T3FREE, THYROIDAB in the last 72 hours. Anemia Panel: Recent Labs    02/06/19 2058   FERRITIN 111    --------------------------------------------------------------------------------------------------------------- Urine analysis: No results found for: COLORURINE, APPEARANCEUR, LABSPEC, PHURINE, GLUCOSEU, HGBUR, BILIRUBINUR, KETONESUR, PROTEINUR, UROBILINOGEN, NITRITE, LEUKOCYTESUR    Imaging Results:    Dg Chest 2 View  Result Date: 02/06/2019 CLINICAL DATA:  Shortness of breath and syncope EXAM: CHEST - 2 VIEW COMPARISON:  Chest radiograph and chest CT Jan 21, 2019 FINDINGS: There is no appreciable edema or consolidation. Heart is slightly enlarged with mild pulmonary venous hypertension. No adenopathy. There is aortic atherosclerosis. No evident bone lesions. IMPRESSION: Pulmonary vascular congestion. No frank edema or consolidation. Aortic Atherosclerosis (ICD10-I70.0). Electronically Signed   By: Lowella Grip III M.D.   On: 02/06/2019 13:05   Ct Head Wo Contrast  Result Date: 02/06/2019 CLINICAL DATA:  Syncope. EXAM: CT HEAD WITHOUT CONTRAST TECHNIQUE: Contiguous axial images were obtained from the base of the skull through the vertex without intravenous contrast. COMPARISON:  None. FINDINGS: Brain: No evidence of acute infarction, hemorrhage, hydrocephalus, extra-axial collection or mass lesion/mass effect. Vascular: No hyperdense vessel or unexpected calcification. Skull: Normal. Negative for fracture or focal lesion. Sinuses/Orbits: No acute finding. Other: None. IMPRESSION: No acute intracranial abnormality. Electronically Signed   By: Constance Holster M.D.   On: 02/06/2019 20:49   Ct Angio Chest Pe W And/or Wo Contrast  Result Date: 02/06/2019 CLINICAL DATA:  Shortness of breath. Recent pulmonary embolus. Subtherapeutic INR. EXAM: CT ANGIOGRAPHY CHEST WITH CONTRAST TECHNIQUE: Multidetector CT imaging of the chest was performed using the standard protocol during bolus administration of intravenous contrast. Multiplanar CT image reconstructions and MIPs were obtained  to evaluate the vascular anatomy. CONTRAST:  192m OMNIPAQUE IOHEXOL 350 MG/ML SOLN COMPARISON:  CTA chest 01/21/2019 FINDINGS: Cardiovascular: --Pulmonary arteries: Satisfactory contrast bolus. Beam attenuation caused by patient body habitus limits assessment beyond the proximal segmental level.Within that limitation, there is no central pulmonary embolus. The bilateral lower lobe emboli visualized previously are not visible on the current study. The main pulmonary artery is mildly enlarged, measuring 3.6 cm. --Aorta: Limited opacification of the aorta due to bolus timing optimization for the pulmonary arteries. Conventional 3 vessel aortic branching pattern. The aortic course and caliber are normal. There is no aortic atherosclerosis. --Heart: Mild cardiomegaly. No pericardial effusion. Mediastinum/Nodes: No mediastinal, hilar or axillary  lymphadenopathy. The visualized thyroid and thoracic esophageal course are unremarkable. Lungs/Pleura: Multifocal ground-glass opacities throughout both lungs. No pleural effusion or pneumothorax. Upper Abdomen: Contrast bolus timing is not optimized for evaluation of the abdominal organs. Within this limitation, the visualized organs of the upper abdomen are normal. Musculoskeletal: No chest wall abnormality. No acute or significant osseous findings. Review of the MIP images confirms the above findings. IMPRESSION: 1. Sensitivity limited by beam attenuation due to body habitus. No central pulmonary embolus. Previously identified emboli are not clearly visible on today's study. 2. Multifocal bilateral ground-glass opacity. This finding may be associated with viral pneumonia (COVID-19 or otherwise). Multifocal small embolic infarcts are also a consideration. 3. Mild cardiomegaly and enlarged main pulmonary artery, suggesting pulmonary hypertension. Electronically Signed   By: Ulyses Jarred M.D.   On: 02/06/2019 17:39       Assessment & Plan:    Principal Problem:   COVID-19  virus infection Active Problems:   Diabetes mellitus type 2 in obese (HCC)   PAF (paroxysmal atrial fibrillation) (HCC)   Dyspnea   Elevated troponin  Dyspnea Covid 19 infection, ddx h/o PE + ground glass opacity on CT chest Check crp, ESR, CPK, LDH, IL-6 Monitor pulse ox, if trending low consider proning, and investigational medical therapy (plasma, remdesivir)  H/o recent PE 01/21/19 with subtherapeutic INR lovenox 50m/ kg [place] bid Coumadin pharmacy to dose  Elevated trop Trop I q6h x3 Check cardiac echo Discussed with cardiology fellow, minimal st elevation not a STEMI Trop elevation likely due to recent PE, or covid infection, not likely related to ACS Recommended trending troponin and consult cardiology if trending upwards  Pafib Lovenox, coumadin as above  Hypertension Cont Amlodipine 165mpo qday Cont Labetalol 20052mo qday Cont Lisinopril 74m62m qday Cont Lasix 80mg57mtid Cont Spironolactone 50mg 59mday  Hyperlipidemia Cont Lipitor 74mg p70ms  Dm2 DC Metformin due to low Hco3 fsbs ac and qhs, ISS     DVT Prophylaxis-   Lovenox - coumadin  AM Labs Ordered, also please review Full Orders  Family Communication: Admission, patients condition and plan of care including tests being ordered have been discussed with the patient who indicate understanding and agree with the planned Code Status.  Code Status:  FULL CODE  Admission status: Inpatient: Based on patients clinical presentation and evaluation of above clinical data, I have made determination that patient meets Inpatient criteria at this time.  Pt will require monitoring of coumadin and lovenox til coumadin therapeutic as well as monitoring of covid + . Pt has high risk of decompensation due to being african american, morbidly obese, diabetic, and troponin elevation.  He will require > 2 nites stay and inpatient status.   Time spent in minutes : 70   Leyana Whidden KJani Gravel 02/06/2019 at 11:01 PM

## 2019-02-06 NOTE — ED Notes (Signed)
Nurse Navigator communication: The patients has received a phone call from a Mr. Faision. He was provided the telephone in the room for him to speak with him

## 2019-02-07 ENCOUNTER — Other Ambulatory Visit (HOSPITAL_COMMUNITY): Payer: Self-pay

## 2019-02-07 LAB — COMPREHENSIVE METABOLIC PANEL
ALT: 31 U/L (ref 0–44)
AST: 26 U/L (ref 15–41)
Albumin: 3.6 g/dL (ref 3.5–5.0)
Alkaline Phosphatase: 47 U/L (ref 38–126)
Anion gap: 9 (ref 5–15)
BUN: 14 mg/dL (ref 6–20)
CO2: 28 mmol/L (ref 22–32)
Calcium: 8.8 mg/dL — ABNORMAL LOW (ref 8.9–10.3)
Chloride: 97 mmol/L — ABNORMAL LOW (ref 98–111)
Creatinine, Ser: 1.48 mg/dL — ABNORMAL HIGH (ref 0.61–1.24)
GFR calc Af Amer: >60 mL/min (ref >60)
GFR calc non Af Amer: 53 mL/min — ABNORMAL LOW (ref >60)
Glucose, Bld: 110 mg/dL — ABNORMAL HIGH (ref 70–99)
Potassium: 4.3 mmol/L (ref 3.5–5.1)
Sodium: 134 mmol/L — ABNORMAL LOW (ref 135–145)
Total Bilirubin: 0.2 mg/dL — ABNORMAL LOW (ref 0.3–1.2)
Total Protein: 8.2 g/dL — ABNORMAL HIGH (ref 6.5–8.1)

## 2019-02-07 LAB — URINALYSIS, ROUTINE W REFLEX MICROSCOPIC
Bilirubin Urine: NEGATIVE
Glucose, UA: NEGATIVE mg/dL
Hgb urine dipstick: NEGATIVE
Ketones, ur: NEGATIVE mg/dL
Leukocytes,Ua: NEGATIVE
Nitrite: NEGATIVE
Protein, ur: 30 mg/dL — AB
Specific Gravity, Urine: 1.043 — ABNORMAL HIGH (ref 1.005–1.030)
pH: 5 (ref 5.0–8.0)

## 2019-02-07 LAB — CBC WITH DIFFERENTIAL/PLATELET
Abs Immature Granulocytes: 0.02 10*3/uL (ref 0.00–0.07)
Basophils Absolute: 0 10*3/uL (ref 0.0–0.1)
Basophils Relative: 0 %
Eosinophils Absolute: 0.1 10*3/uL (ref 0.0–0.5)
Eosinophils Relative: 1 %
HCT: 45.1 % (ref 39.0–52.0)
Hemoglobin: 13.2 g/dL (ref 13.0–17.0)
Immature Granulocytes: 0 %
Lymphocytes Relative: 31 %
Lymphs Abs: 1.8 10*3/uL (ref 0.7–4.0)
MCH: 23.9 pg — ABNORMAL LOW (ref 26.0–34.0)
MCHC: 29.3 g/dL — ABNORMAL LOW (ref 30.0–36.0)
MCV: 81.6 fL (ref 80.0–100.0)
Monocytes Absolute: 0.6 10*3/uL (ref 0.1–1.0)
Monocytes Relative: 10 %
Neutro Abs: 3.5 10*3/uL (ref 1.7–7.7)
Neutrophils Relative %: 58 %
Platelets: 324 10*3/uL (ref 150–400)
RBC: 5.53 MIL/uL (ref 4.22–5.81)
RDW: 15.5 % (ref 11.5–15.5)
WBC: 6 10*3/uL (ref 4.0–10.5)
nRBC: 0 % (ref 0.0–0.2)

## 2019-02-07 LAB — PROTIME-INR
INR: 1.5 — ABNORMAL HIGH (ref 0.8–1.2)
Prothrombin Time: 17.7 seconds — ABNORMAL HIGH (ref 11.4–15.2)

## 2019-02-07 LAB — GLUCOSE, CAPILLARY
Glucose-Capillary: 91 mg/dL (ref 70–99)
Glucose-Capillary: 103 mg/dL — ABNORMAL HIGH (ref 70–99)
Glucose-Capillary: 95 mg/dL (ref 70–99)

## 2019-02-07 LAB — FERRITIN: Ferritin: 99 ng/mL (ref 24–336)

## 2019-02-07 LAB — TROPONIN I
Troponin I: 0.12 ng/mL (ref <0.03)
Troponin I: 0.1 ng/mL (ref <0.03)

## 2019-02-07 LAB — C-REACTIVE PROTEIN: CRP: 3.1 mg/dL — ABNORMAL HIGH (ref <1.0)

## 2019-02-07 MED ORDER — WARFARIN - PHARMACIST DOSING INPATIENT: Freq: Every day | Status: DC

## 2019-02-07 MED ORDER — WARFARIN SODIUM 7.5 MG PO TABS
17.5000 mg | ORAL_TABLET | Freq: Once | ORAL | Status: AC
  Administered 2019-02-07: 17.5 mg via ORAL
  Filled 2019-02-07: qty 1

## 2019-02-07 MED ORDER — METHOCARBAMOL 500 MG PO TABS
500.0000 mg | ORAL_TABLET | Freq: Three times a day (TID) | ORAL | Status: DC
  Administered 2019-02-07 – 2019-02-14 (×21): 500 mg via ORAL
  Filled 2019-02-07 (×21): qty 1

## 2019-02-07 MED ORDER — ENOXAPARIN SODIUM 300 MG/3ML IJ SOLN
190.0000 mg | Freq: Two times a day (BID) | INTRAMUSCULAR | Status: DC
  Administered 2019-02-07 – 2019-02-09 (×5): 190 mg via SUBCUTANEOUS
  Filled 2019-02-07 (×8): qty 1.9

## 2019-02-07 NOTE — Progress Notes (Signed)
Pt questioned about last bowel movement. Pt unsure and may be PTA or earlier.   Will pass along in report to [facility].

## 2019-02-07 NOTE — Progress Notes (Signed)
Pt desatting to 87-89% on room air while sleeping.  RN placed pt on 2L [place], oxygen up to 92%. MD notified.  Recommendation for outpatient sleep study.

## 2019-02-07 NOTE — Progress Notes (Signed)
ANTICOAGULATION CONSULT NOTE  Pharmacy Consult for warfarin Indication: hx of PE, atrial fibrillation   No Known Allergies  Patient Measurements: Height: 5\' 11"  (180.3 cm) Weight: (!) 414 lb 7.4 oz (188 kg) IBW/kg (Calculated) : 75.3  Vital Signs: Temp: 98.9 F (37.2 C) (05/21 0000) Temp Source: Oral (05/21 0000) BP: 126/85 (05/21 0800) Pulse Rate: 94 (05/21 0800)  Labs: Recent Labs    02/06/19 1156 02/06/19 2058 02/07/19 0650  HGB 13.0  --  13.2  HCT 43.2  --  45.1  PLT 298  --  324  LABPROT 17.5*  --  17.7*  INR 1.5*  --  1.5*  CREATININE 1.18  --  1.48*  TROPONINI 0.11* 0.13* 0.12*    Estimated Creatinine Clearance: 98.3 mL/min (A) (by C-G formula based on SCr of 1.48 mg/dL (H)).   Medical History: Past Medical History:  Diagnosis Date  . Diabetes mellitus without complication (HCC)   . Hypertension   . Obesity   . Pulmonary embolism (HCC)   . Sleep apnea      Assessment: 67 yoM with PMH of atrial fibrillation and recent acute PE discharged on enoxaparin and warfarin who presented on 5/20 with dyspnea and syncope. INR subtherapeutic on admission, 1.5. Patient was previously discharged on 01/24/19 with prescriptions for lovenox 190mg  (150mg  + 40mg  syringes) and warfarin 15mg  daily and was instructed to follow-up as outpatient for INR check. Per patient, his pharmacy only had the 150mg  syringes available in stock, so he had only been using lovenox 150mg  SQ BID as bridge. He also stated that due to issues with scheduling at [facility], he was unable to be seen for follow up INR check after discharge. He does not report any missed doses of medication between admissions.   Pharmacy consulted for lovenox/warfarin dosing on admission.   INR = 1.5 today, remains subtherapeutic CBC: H/H, Pltc WNL No bleeding issues reported  Goal of Therapy:  INR 2-3 Monitor platelets by anticoagulation protocol: Yes   Plan:  - Continue lovenox 1mg /kg SQ q12h until INR  therapeutic  - Warfarin 17.5mg  PO x 1 today - Daily PT/INR - Monitor CBC and for s/sx of bleeding   Greer Pickerel, PharmD, BCPS Clinical Pharmacist Please check AMION for all [facility] Pharmacy numbers 02/07/2019

## 2019-02-07 NOTE — Progress Notes (Signed)
Pt c/o 10/10 muscle spasms & cramping in entire left side. MD notified.

## 2019-02-07 NOTE — Progress Notes (Signed)
[facility] Progress Note  Patient: Benjamin [name] YTK:[id number]   PCP: System, Pcp Not In DOB: Sep 10, 1966   DOA: 02/06/2019   DOS: 02/07/2019   Date of Service: the patient was seen and examined on 02/07/2019  Brief hospital course: Pt. with PMH of Dm2, morbid obesity, recently diagnosed with PE; admitted on 02/06/2019, presented with complaint of shortnes of breath and passing out episode, was found to have acute hypoxic resp failure. Currently further plan is continue close monitoring for COVID infection.  Subjective: Pt has shortnes of breath as well as mild left chest pain,. No fever or chills,. Still has some cough. No dizziness on walking.   Assessment and Plan: Acute hypoxic respiratory failure Dyspnea Covid 19 infection, ddx h/o PE ground glass opacity on CT chest Elevated crp, ESR, CPK, LDH, IL-6 Monitor pulse ox, if trending low consider proning, and investigational medical therapy (plasma, remdesivir)  H/o recent PE 01/21/19 with subtherapeutic INR lovenox 39m/ kg [place] bid Coumadin pharmacy to dose [place] with warfarin. Patient reportedly never was therapeutic on INR before stopping Lovenox.  Case management consult recommended.  Elevated trop Trop elevated but stable. Admitting provider Discussed with cardiology fellow, not a STEMI Trop elevation likely due to recent PE, or covid infection, not likely related to ACS  Paroxysmal afib Lovenox, coumadin as above  Hypertension Cont Amlodipine 161mpo qday Cont Labetalol 20044mo qday Cont Lisinopril 51m52m qday Hold Lasix 80mg67mtid Cont Spironolactone 50mg 8mday  Hyperlipidemia Cont Lipitor 51mg p23ms  Dm2 DC Metformin due to low Hco3 fsbs ac and qhs, ISS  Morbid obesity  Body mass index is 57.81 kg/m.  Dietary consultation  OSA. On CPAP at night at home. Currently holding in the setting of COVID.  Diet: cardiac diet DVT Prophylaxis: subcutaneous Heparin  Advance goals of care  discussion: full code  Family Communication: no family was present at bedside, at the time of interview.   Disposition:  Discharge to be determined.  Consultants: none Procedures: none  Scheduled Meds:  amLODipine  10 mg Oral Daily   atorvastatin  40 mg Oral QHS   enoxaparin (LOVENOX) injection  190 mg Subcutaneous Q12H   insulin aspart  0-5 Units Subcutaneous QHS   insulin aspart  0-9 Units Subcutaneous TID WC   labetalol  200 mg Oral Daily   lisinopril  40 mg Oral Daily   mouth rinse  15 mL Mouth Rinse BID   methocarbamol  500 mg Oral TID   pantoprazole  40 mg Oral Daily   polyvinyl alcohol  1-2 drop Both Eyes BID   sodium chloride flush  3 mL Intravenous Q12H   spironolactone  50 mg Oral Daily   Warfarin - Pharmacist Dosing Inpatient   Does not apply q1800   Continuous Infusions:  sodium chloride     PRN Meds: sodium chloride, sodium chloride flush Antibiotics: Anti-infectives (From admission, onward)   None       Objective: Physical Exam: Vitals:   02/07/19 0800 02/07/19 1000 02/07/19 1200 02/07/19 1400  BP: 126/85  119/65   Pulse: 94  85   Resp:      Temp: 98.5 F (36.9 C)  98.5 F (36.9 C)   TempSrc: Oral  Oral   SpO2: 94% 93% 93% 94%  Weight:      Height:        Intake/Output Summary (Last 24 hours) at 02/07/2019 1718 Last data filed at 02/07/2019 1000 Gross per 24 hour  Intake  240 ml  Output 300 ml  Net -60 ml   Filed Weights   02/06/19 2100  Weight: (!) 188 kg   General: Alert, Awake and Oriented to Time, Place and Person. Appear in mild distress, affect appropriate Eyes: PERRL, Conjunctiva normal ENT: Oral Mucosa clear moist. Neck: no JVD, no Abnormal Mass Or lumps Cardiovascular: S1 and S2 Present, no Murmur, Peripheral Pulses Present Respiratory: normal respiratory effort, Bilateral Air entry equal and Decreased, no use of accessory muscle, Clear to Auscultation, no Crackles, no wheezes Abdomen: Bowel Sound present, Soft  and no tenderness, no hernia Skin: no redness, no Rash, no induration Extremities: no Pedal edema, no calf tenderness Neurologic: Grossly no focal neuro deficit. Bilaterally Equal motor strength  Data Reviewed: CBC: Recent Labs  Lab 02/06/19 1156 02/07/19 0650  WBC 5.6 6.0  NEUTROABS 2.8 3.5  HGB 13.0 13.2  HCT 43.2 45.1  MCV 81.1 81.6  PLT 298 272   Basic Metabolic Panel: Recent Labs  Lab 02/06/19 1156 02/07/19 0650  NA 136 134*  K 4.9 4.3  CL 102 97*  CO2 21* 28  GLUCOSE 80 110*  BUN 14 14  CREATININE 1.18 1.48*  CALCIUM 8.8* 8.8*    Liver Function Tests: Recent Labs  Lab 02/06/19 1156 02/07/19 0650  AST 30 26  ALT 33 31  ALKPHOS 44 47  BILITOT 0.5 0.2*  PROT 7.8 8.2*  ALBUMIN 3.4* 3.6   No results for input(s): LIPASE, AMYLASE in the last 168 hours. No results for input(s): AMMONIA in the last 168 hours. Coagulation Profile: Recent Labs  Lab 02/06/19 1156 02/07/19 0650  INR 1.5* 1.5*   Cardiac Enzymes: Recent Labs  Lab 02/06/19 1156 02/06/19 2058 02/07/19 0650 02/07/19 1301  TROPONINI 0.11* 0.13* 0.12* 0.10*   BNP (last 3 results) No results for input(s): PROBNP in the last 8760 hours. CBG: Recent Labs  Lab 02/06/19 1214 02/06/19 2112 02/07/19 0857 02/07/19 1247  GLUCAP 86 76 91 103*   Studies: Ct Head Wo Contrast  Result Date: 02/06/2019 CLINICAL DATA:  Syncope. EXAM: CT HEAD WITHOUT CONTRAST TECHNIQUE: Contiguous axial images were obtained from the base of the skull through the vertex without intravenous contrast. COMPARISON:  None. FINDINGS: Brain: No evidence of acute infarction, hemorrhage, hydrocephalus, extra-axial collection or mass lesion/mass effect. Vascular: No hyperdense vessel or unexpected calcification. Skull: Normal. Negative for fracture or focal lesion. Sinuses/Orbits: No acute finding. Other: None. IMPRESSION: No acute intracranial abnormality. Electronically Signed   By: Constance Holster M.D.   On: 02/06/2019 20:49    Ct Angio Chest Pe W And/or Wo Contrast  Result Date: 02/06/2019 CLINICAL DATA:  Shortness of breath. Recent pulmonary embolus. Subtherapeutic INR. EXAM: CT ANGIOGRAPHY CHEST WITH CONTRAST TECHNIQUE: Multidetector CT imaging of the chest was performed using the standard protocol during bolus administration of intravenous contrast. Multiplanar CT image reconstructions and MIPs were obtained to evaluate the vascular anatomy. CONTRAST:  143m OMNIPAQUE IOHEXOL 350 MG/ML SOLN COMPARISON:  CTA chest 01/21/2019 FINDINGS: Cardiovascular: --Pulmonary arteries: Satisfactory contrast bolus. Beam attenuation caused by patient body habitus limits assessment beyond the proximal segmental level.Within that limitation, there is no central pulmonary embolus. The bilateral lower lobe emboli visualized previously are not visible on the current study. The main pulmonary artery is mildly enlarged, measuring 3.6 cm. --Aorta: Limited opacification of the aorta due to bolus timing optimization for the pulmonary arteries. Conventional 3 vessel aortic branching pattern. The aortic course and caliber are normal. There is no aortic atherosclerosis. --Heart: Mild cardiomegaly.  No pericardial effusion. Mediastinum/Nodes: No mediastinal, hilar or axillary lymphadenopathy. The visualized thyroid and thoracic esophageal course are unremarkable. Lungs/Pleura: Multifocal ground-glass opacities throughout both lungs. No pleural effusion or pneumothorax. Upper Abdomen: Contrast bolus timing is not optimized for evaluation of the abdominal organs. Within this limitation, the visualized organs of the upper abdomen are normal. Musculoskeletal: No chest wall abnormality. No acute or significant osseous findings. Review of the MIP images confirms the above findings. IMPRESSION: 1. Sensitivity limited by beam attenuation due to body habitus. No central pulmonary embolus. Previously identified emboli are not clearly visible on today's study. 2.  Multifocal bilateral ground-glass opacity. This finding may be associated with viral pneumonia (COVID-19 or otherwise). Multifocal small embolic infarcts are also a consideration. 3. Mild cardiomegaly and enlarged main pulmonary artery, suggesting pulmonary hypertension. Electronically Signed   By: Ulyses Jarred M.D.   On: 02/06/2019 17:39     Time spent: 35 minutes  Author: Berle Mull, MD [facility] 02/07/2019 5:18 PM  To reach On-call, see care teams to locate the attending and reach out to them via [email or web address]. If 7PM-7AM, please contact night-coverage If you still have difficulty reaching the attending provider, please page the [facility] (Director on Call) for [facility] on amion for assistance.

## 2019-02-08 DIAGNOSIS — J9601 Acute respiratory failure with hypoxia: Secondary | ICD-10-CM

## 2019-02-08 LAB — CBC
HCT: 42.3 % (ref 39.0–52.0)
Hemoglobin: 12.2 g/dL — ABNORMAL LOW (ref 13.0–17.0)
MCH: 23.5 pg — ABNORMAL LOW (ref 26.0–34.0)
MCHC: 28.8 g/dL — ABNORMAL LOW (ref 30.0–36.0)
MCV: 81.5 fL (ref 80.0–100.0)
Platelets: 331 10*3/uL (ref 150–400)
RBC: 5.19 MIL/uL (ref 4.22–5.81)
RDW: 15.4 % (ref 11.5–15.5)
WBC: 6.4 10*3/uL (ref 4.0–10.5)
nRBC: 0 % (ref 0.0–0.2)

## 2019-02-08 LAB — GLUCOSE, CAPILLARY
Glucose-Capillary: 134 mg/dL — ABNORMAL HIGH (ref 70–99)
Glucose-Capillary: 97 mg/dL (ref 70–99)
Glucose-Capillary: 110 mg/dL — ABNORMAL HIGH (ref 70–99)
Glucose-Capillary: 123 mg/dL — ABNORMAL HIGH (ref 70–99)
Glucose-Capillary: 100 mg/dL — ABNORMAL HIGH (ref 70–99)

## 2019-02-08 LAB — C-REACTIVE PROTEIN: CRP: 1.7 mg/dL — ABNORMAL HIGH (ref <1.0)

## 2019-02-08 LAB — INTERLEUKIN-6, PLASMA: Interleukin-6, Plasma: 23.2 pg/mL — ABNORMAL HIGH (ref 0.0–12.2)

## 2019-02-08 LAB — CREATININE, URINE, RANDOM: Creatinine, Urine: 226.67 mg/dL

## 2019-02-08 LAB — SODIUM, URINE, RANDOM: Sodium, Ur: 16 mmol/L

## 2019-02-08 LAB — PROTIME-INR
INR: 1.9 — ABNORMAL HIGH (ref 0.8–1.2)
Prothrombin Time: 21.4 seconds — ABNORMAL HIGH (ref 11.4–15.2)

## 2019-02-08 LAB — ABO/RH: ABO/RH(D): O POS

## 2019-02-08 LAB — FERRITIN: Ferritin: 91 ng/mL (ref 24–336)

## 2019-02-08 LAB — D-DIMER, QUANTITATIVE: D-Dimer, Quant: <0.27 ug/mL-FEU (ref 0.00–0.50)

## 2019-02-08 MED ORDER — WARFARIN SODIUM 7.5 MG PO TABS
17.5000 mg | ORAL_TABLET | Freq: Once | ORAL | Status: AC
  Administered 2019-02-08: 17.5 mg via ORAL
  Filled 2019-02-08: qty 1

## 2019-02-08 MED ORDER — VITAMIN C 500 MG PO TABS
500.0000 mg | ORAL_TABLET | Freq: Every day | ORAL | Status: DC
  Administered 2019-02-08 – 2019-02-14 (×7): 500 mg via ORAL
  Filled 2019-02-08 (×7): qty 1

## 2019-02-08 MED ORDER — ZINC SULFATE 220 (50 ZN) MG PO CAPS
220.0000 mg | ORAL_CAPSULE | Freq: Every day | ORAL | Status: DC
  Administered 2019-02-08 – 2019-02-14 (×7): 220 mg via ORAL
  Filled 2019-02-08 (×7): qty 1

## 2019-02-08 NOTE — Progress Notes (Signed)
PROGRESS NOTE    Benjamin [name]  XID:[id number] DOB: 1966/01/13 DOA: 02/06/2019 PCP: System, Pcp Not In      Brief Narrative:  [name] is a 53 y.o. M with DM, MO, OSA on CPAP, new pAfib, and HTN who was recently admitted with leg swelling, dyspnea, found to have PE, started on Lovenox bridge to warfarin who was discharged 5/7, felt out of breath since discharge, but in last few days, symptoms notable much worse, then syncopized twice.  Returned to ER, concern for "my clots are back".  In ER, CTA chest showed no PE, but did shows bilateral peripheral GGOs, concerning for COVID and SARS-CoV-2 testing positive.      Assessment & Plan:  Coronavirus pneumonitis CT chest shows ground glass opacities.  High risk given obesity, diabetes.    In setting of ongoing 2020 COVID-19 pandemic.  Dimer, ferritin, CRP reassuing but troponin 0.1.  O2 requirements stable.  -Continue supplemental O2 -On therapeutic dose antiocagulation for other indication -Continue Zinc and Vitamin C -Daily d-dimer, ferritin and CRP  This patient has confirmed COVID-19 in the setting of the ongoing 2020 coronavirus pandemic.  He has hypoxia, but expected to survive >48 hours and has good baseline functional status.  He is not known to be on immunomodulators, anti-rejection medications, or cancer chemotherapy, has no history of TB or latent TB, and no history of diverticulitis or intestinal perforation.  Platelets are >50K, ANC is >500, and ALT/AST are below 5x ULN with no known hepatitis B infection.  -If CRP, ferritin, dimer, troponin trend up, will discuss off-label use of Actemra       Recent PE INR subtherapeutic -Continue Lovenox bridge -Continue warfarin, dosing discussed with pharmacy  Atrial fibrillation, paroxysmal -Continue BB -Continue warfarin  Chronic diastolic CHF Hypertension Does not appear fluid overloaded.  BP slightly high this AM. -Continue amlodipine, labetalol,  spironolactone -Hold Lasix, lisinopril given Cr  Diabetes Glucose good control -Continue SSI -Hold metformin -Continue atorvastatin  OSA -CPAP at night  Morbid obesity Body mass index is 58.72 kg/m.  Other  -Continue Robaxin, PPI  AKI Creatinine baseline 1.1, today up to 1.5. -Hold lisinopril, Lasix -Repeat Cr -Check urine lytes       MDM and disposition: The below labs and imaging reports were reviewed and summarized above.  Medication management as above.  The patient was admitted with acute hypoxic respiratory failure from COVID.  He is still requiring supplemental O2 (does not have chronic respiratory falure or baseline O2 requirement) and has signs of end organ injury (troponin elevation).  This is acute illness that poses threat to life, limb, bodily fcn.  Anticoagulation was actively managed.  An extensive number of diagnoses or treatment options were managed.        DVT prophylaxis: N/A on therapeutic dose Lovenox Code Status: FULL Family Communication: None    Consultants:   None  Procedures:   None  Antimicrobials:   None    Subjective: Feeling okay.  No confusion. No fever.  No vomiting, diarrhea.  No dyspnea.  Does have migratory chest pain, back pain ("muscle spasms"), relieved with Robaxin.  No dizziness, sycnope  Objective: Vitals:   02/08/19 0611 02/08/19 0700 02/08/19 0752 02/08/19 0800  BP: (!) 153/89 (!) 168/96  (!) 143/95  Pulse: 95 87  93  Resp: 18     Temp: (!) 100.4 F (38 C)  99.5 F (37.5 C)   TempSrc: Oral  Oral   SpO2: 94% 94%  Marland Kitchen)  89%  Weight:      Height:        Intake/Output Summary (Last 24 hours) at 02/08/2019 1106 Last data filed at 02/08/2019 1021 Gross per 24 hour  Intake 1370 ml  Output 800 ml  Net 570 ml   Filed Weights   02/06/19 2100  Weight: (!) 188 kg    Examination: General appearance: obese adult male, alert and in no acute distress.  Sitting in chair, interactive HEENT: Anicteric,  conjunctiva pink, lids and lashes normal. No nasal deformity, discharge, epistaxis.  Lips moist, dentition normal, OP moist, no oral lesions, hearing normal.   Skin: Warm and dry.   No suspicious rashes or lesions. Cardiac: RRR, nl S1-S2, no murmurs appreciated.  Capillary refill is brisk.  JVP not visible due to habitus.  No LE edema.  Radial pulses 2+ and symmetric. Respiratory: Normal respiratory rate and rhythm.  Lungs diminished blilaterally Abdomen: Abdomen soft.  No TTP. No ascites, distension, hepatosplenomegaly.   MSK: No deformities or effusions. Neuro: Awake and alert.  EOMI, moves all extremities. Speech fluent.    Psych: Sensorium intact and responding to questions, attention normal. Affect normal.  Judgment and insight appear normal.    Data Reviewed: I have personally reviewed following labs and imaging studies:  CBC: Recent Labs  Lab 02/06/19 1156 02/07/19 0650  WBC 5.6 6.0  NEUTROABS 2.8 3.5  HGB 13.0 13.2  HCT 43.2 45.1  MCV 81.1 81.6  PLT 298 324   Basic Metabolic Panel: Recent Labs  Lab 02/06/19 1156 02/07/19 0650  NA 136 134*  K 4.9 4.3  CL 102 97*  CO2 21* 28  GLUCOSE 80 110*  BUN 14 14  CREATININE 1.18 1.48*  CALCIUM 8.8* 8.8*   GFR: Estimated Creatinine Clearance: 98.3 mL/min (A) (by C-G formula based on SCr of 1.48 mg/dL (H)). Liver Function Tests: Recent Labs  Lab 02/06/19 1156 02/07/19 0650  AST 30 26  ALT 33 31  ALKPHOS 44 47  BILITOT 0.5 0.2*  PROT 7.8 8.2*  ALBUMIN 3.4* 3.6   No results for input(s): LIPASE, AMYLASE in the last 168 hours. No results for input(s): AMMONIA in the last 168 hours. Coagulation Profile: Recent Labs  Lab 02/06/19 1156 02/07/19 0650  INR 1.5* 1.5*   Cardiac Enzymes: Recent Labs  Lab 02/06/19 1156 02/06/19 2058 02/07/19 0650 02/07/19 1301  TROPONINI 0.11* 0.13* 0.12* 0.10*   BNP (last 3 results) No results for input(s): PROBNP in the last 8760 hours. HbA1C: No results for input(s): HGBA1C  in the last 72 hours. CBG: Recent Labs  Lab 02/07/19 1247 02/07/19 1756 02/07/19 2241 02/08/19 0708 02/08/19 0749  GLUCAP 103* 95 134* 97 110*   Lipid Profile: No results for input(s): CHOL, HDL, LDLCALC, TRIG, CHOLHDL, LDLDIRECT in the last 72 hours. Thyroid Function Tests: No results for input(s): TSH, T4TOTAL, FREET4, T3FREE, THYROIDAB in the last 72 hours. Anemia Panel: Recent Labs    02/06/19 2058 02/07/19 1301  FERRITIN 111 99   Urine analysis:    Component Value Date/Time   COLORURINE YELLOW 02/07/2019 1042   APPEARANCEUR HAZY (A) 02/07/2019 1042   LABSPEC 1.043 (H) 02/07/2019 1042   PHURINE 5.0 02/07/2019 1042   GLUCOSEU NEGATIVE 02/07/2019 1042   HGBUR NEGATIVE 02/07/2019 1042   BILIRUBINUR NEGATIVE 02/07/2019 1042   KETONESUR NEGATIVE 02/07/2019 1042   PROTEINUR 30 (A) 02/07/2019 1042   NITRITE NEGATIVE 02/07/2019 1042   LEUKOCYTESUR NEGATIVE 02/07/2019 1042   Sepsis Labs: @LABRCNTIP (procalcitonin:4,lacticacidven:4)  ) Recent Results (  from the past 240 hour(s))  SARS Coronavirus 2 (CEPHEID- Performed in [facility] lab), Hosp Order     Status: Abnormal   Collection Time: 02/06/19  3:30 PM  Result Value Ref Range Status   SARS Coronavirus 2 POSITIVE (A) NEGATIVE Final    Comment: RESULT CALLED TO, READ BACK BY AND VERIFIED WITH: Azalee CourseM HEATH RN [id number] 02/06/19 A BROWNING (NOTE) If result is NEGATIVE SARS-CoV-2 target nucleic acids are NOT DETECTED. The SARS-CoV-2 RNA is generally detectable in upper and lower  respiratory specimens during the acute phase of infection. The lowest  concentration of SARS-CoV-2 viral copies this assay can detect is 250  copies / mL. A negative result does not preclude SARS-CoV-2 infection  and should not be used as the sole basis for treatment or other  patient management decisions.  A negative result may occur with  improper specimen collection / handling, submission of specimen other  than nasopharyngeal swab,  presence of viral mutation(s) within the  areas targeted by this assay, and inadequate number of viral copies  (<250 copies / mL). A negative result must be combined with clinical  observations, patient history, and epidemiological information. If result is POSITIVE SARS-CoV-2 target nucleic acids are DETECTED. Th e SARS-CoV-2 RNA is generally detectable in upper and lower  respiratory specimens during the acute phase of infection.  Positive  results are indicative of active infection with SARS-CoV-2.  Clinical  correlation with patient history and other diagnostic information is  necessary to determine patient infection status.  Positive results do  not rule out bacterial infection or co-infection with other viruses. If result is PRESUMPTIVE POSTIVE SARS-CoV-2 nucleic acids MAY BE PRESENT.   A presumptive positive result was obtained on the submitted specimen  and confirmed on repeat testing.  While 2019 novel coronavirus  (SARS-CoV-2) nucleic acids may be present in the submitted sample  additional confirmatory testing may be necessary for epidemiological  and / or clinical management purposes  to differentiate between  SARS-CoV-2 and other Sarbecovirus currently known to infect humans.  If clinically indicated additional testing with an alternate test  methodology [phone] is  advised. The SARS-CoV-2 RNA is generally  detectable in upper and lower respiratory specimens during the acute  phase of infection. The expected result is Negative. Fact Sheet for Patients:  BoilerBrush.com.cyhttps://www.fda.gov/media/136312/download Fact Sheet for Healthcare Providers: https://pope.com/https://www.fda.gov/media/136313/download This test is not yet approved or cleared by the [place] FDA and has been authorized for detection and/or diagnosis of SARS-CoV-2 by FDA under an Emergency Use Authorization (EUA).  This EUA will remain in effect (meaning this test can be used) for the duration of the COVID-19 declaration under  Section 564(b)(1) of the Act, 21 U.S.C. section 360bbb-3(b)(1), unless the authorization is terminated or revoked sooner. Performed at [facility], [address] [place], [place] [address]          Radiology Studies: Dg Chest 2 View  Result Date: 02/06/2019 CLINICAL DATA:  Shortness of breath and syncope EXAM: CHEST - 2 VIEW COMPARISON:  Chest radiograph and chest CT Jan 21, 2019 FINDINGS: There is no appreciable edema or consolidation. Heart is slightly enlarged with mild pulmonary venous hypertension. No adenopathy. There is aortic atherosclerosis. No evident bone lesions. IMPRESSION: Pulmonary vascular congestion. No frank edema or consolidation. Aortic Atherosclerosis (ICD10-I70.0). Electronically Signed   By: Bretta BangWilliam  Woodruff III M.D.   On: 02/06/2019 13:05   Ct Head Wo Contrast  Result Date: 02/06/2019 CLINICAL DATA:  Syncope. EXAM: CT  HEAD WITHOUT CONTRAST TECHNIQUE: Contiguous axial images were obtained from the base of the skull through the vertex without intravenous contrast. COMPARISON:  None. FINDINGS: Brain: No evidence of acute infarction, hemorrhage, hydrocephalus, extra-axial collection or mass lesion/mass effect. Vascular: No hyperdense vessel or unexpected calcification. Skull: Normal. Negative for fracture or focal lesion. Sinuses/Orbits: No acute finding. Other: None. IMPRESSION: No acute intracranial abnormality. Electronically Signed   By: Katherine Mantle M.D.   On: 02/06/2019 20:49   Ct Angio Chest Pe W And/or Wo Contrast  Result Date: 02/06/2019 CLINICAL DATA:  Shortness of breath. Recent pulmonary embolus. Subtherapeutic INR. EXAM: CT ANGIOGRAPHY CHEST WITH CONTRAST TECHNIQUE: Multidetector CT imaging of the chest was performed using the standard protocol during bolus administration of intravenous contrast. Multiplanar CT image reconstructions and MIPs were obtained to evaluate the vascular anatomy. CONTRAST:  OMNIPAQUE IOHEXOL 350 MG/ML SOLN  COMPARISON:  CTA chest 01/21/2019 FINDINGS: Cardiovascular: --Pulmonary arteries: Satisfactory contrast bolus. Beam attenuation caused by patient body habitus limits assessment beyond the proximal segmental level.Within that limitation, there is no central pulmonary embolus. The bilateral lower lobe emboli visualized previously are not visible on the current study. The main pulmonary artery is mildly enlarged, measuring 3.6 cm. --Aorta: Limited opacification of the aorta due to bolus timing optimization for the pulmonary arteries. Conventional 3 vessel aortic branching pattern. The aortic course and caliber are normal. There is no aortic atherosclerosis. --Heart: Mild cardiomegaly. No pericardial effusion. Mediastinum/Nodes: No mediastinal, hilar or axillary lymphadenopathy. The visualized thyroid and thoracic esophageal course are unremarkable. Lungs/Pleura: Multifocal ground-glass opacities throughout both lungs. No pleural effusion or pneumothorax. Upper Abdomen: Contrast bolus timing is not optimized for evaluation of the abdominal organs. Within this limitation, the visualized organs of the upper abdomen are normal. Musculoskeletal: No chest wall abnormality. No acute or significant osseous findings. Review of the MIP images confirms the above findings. IMPRESSION: 1. Sensitivity limited by beam attenuation due to body habitus. No central pulmonary embolus. Previously identified emboli are not clearly visible on today's study. 2. Multifocal bilateral ground-glass opacity. This finding may be associated with viral pneumonia (COVID-19 or otherwise). Multifocal small embolic infarcts are also a consideration. 3. Mild cardiomegaly and enlarged main pulmonary artery, suggesting pulmonary hypertension. Electronically Signed   By: Deatra Robinson M.D.   On: 02/06/2019 17:39        Scheduled Meds: . amLODipine  10 mg Oral Daily  . atorvastatin  40 mg Oral QHS  . enoxaparin (LOVENOX) injection  190 mg  Subcutaneous Q12H  . insulin aspart  0-5 Units Subcutaneous QHS  . insulin aspart  0-9 Units Subcutaneous TID WC  . labetalol  200 mg Oral Daily  . mouth rinse  15 mL Mouth Rinse BID  . methocarbamol  500 mg Oral TID  . pantoprazole  40 mg Oral Daily  . polyvinyl alcohol  1-2 drop Both Eyes BID  . sodium chloride flush  3 mL Intravenous Q12H  . spironolactone  50 mg Oral Daily  . vitamin C  500 mg Oral Daily  . Warfarin - Pharmacist Dosing Inpatient   Does not apply q1800  . zinc sulfate  220 mg Oral Daily   Continuous Infusions: . sodium chloride       LOS: 2 days    Time spent: 35 minutes  During this encounter: Patient Isolation: Airborne, contact, droplet HCP PPE: Face shield, head covering, N95, gown, gloves, and shoe covers    Alberteen Sam, MD [facility] 02/08/2019, 11:06 AM  Please page through AMION:  [email or web address] Password [email or web address] If 7PM-7AM, please contact night-coverage

## 2019-02-08 NOTE — Progress Notes (Signed)
ANTICOAGULATION CONSULT NOTE  Pharmacy Consult for warfarin Indication: hx of PE, atrial fibrillation   No Known Allergies  Patient Measurements: Height: 5\' 11"  (180.3 cm) Weight: (!) 414 lb 7.4 oz (188 kg) IBW/kg (Calculated) : 75.3  Vital Signs: Temp: 99.8 F (37.7 C) (05/22 1157) Temp Source: Oral (05/22 1157) BP: 106/87 (05/22 1341) Pulse Rate: 90 (05/22 1342)  Labs: Recent Labs    02/06/19 1156 02/06/19 2058 02/07/19 0650 02/07/19 1301  HGB 13.0  --  13.2  --   HCT 43.2  --  45.1  --   PLT 298  --  324  --   LABPROT 17.5*  --  17.7*  --   INR 1.5*  --  1.5*  --   CREATININE 1.18  --  1.48*  --   TROPONINI 0.11* 0.13* 0.12* 0.10*    Estimated Creatinine Clearance: 98.3 mL/min (A) (by C-G formula based on SCr of 1.48 mg/dL (H)).  Assessment: 54 yoM with PMH of atrial fibrillation and recent acute PE (discharged on enoxaparin and warfarin) who presented on 5/20 with dyspnea and syncope. INR 1.5 was subtherapeutic on admission.  Patient was previously discharged on 01/24/19 with prescriptions for lovenox 190mg  (150mg  + 40mg  syringes) and warfarin 15mg  daily and was instructed to follow-up as outpatient for INR check. Per patient, his pharmacy only had the 150mg  syringes available in stock, so he had only been using lovenox 150mg  SQ BID as bridge. He also stated that due to issues with scheduling at [facility], he was unable to be seen for follow up INR check after discharge. He does not report any missed doses of medication between admissions.   Pharmacy consulted for lovenox/warfarin dosing on admission.   INR 1.9, remains subtherapeutic, but increased CBC: Hgb decreased to 12.2, Pltc WNL No bleeding issues reported  Goal of Therapy:  INR 2-3 Monitor platelets by anticoagulation protocol: Yes   Plan:  - Continue lovenox 1mg /kg SQ q12h until INR therapeutic  - Warfarin 17.5mg  PO x 1 today - Daily PT/INR - Monitor CBC and for s/sx of bleeding  Lynann Beaver PharmD, BCPS 02/08/2019 2:25 PM

## 2019-02-08 NOTE — Plan of Care (Signed)
  Problem: Education: Goal: Knowledge of General Education information will improve Description Including pain rating scale, medication(s)/side effects and non-pharmacologic comfort measures Outcome: Progressing   Problem: Clinical Measurements: Goal: Diagnostic test results will improve Outcome: Progressing Goal: Respiratory complications will improve Outcome: Progressing Goal: Cardiovascular complication will be avoided Outcome: Progressing   Problem: Activity: Goal: Risk for activity intolerance will decrease Outcome: Progressing   Problem: Nutrition: Goal: Adequate nutrition will be maintained Outcome: Progressing   Problem: Coping: Goal: Level of anxiety will decrease Outcome: Progressing   Problem: Safety: Goal: Ability to remain free from injury will improve Outcome: Progressing   Problem: Skin Integrity: Goal: Risk for impaired skin integrity will decrease Outcome: Progressing   Problem: Education: Goal: Knowledge of risk factors and measures for prevention of condition will improve Outcome: Progressing   Problem: Coping: Goal: Psychosocial and spiritual needs will be supported Outcome: Progressing   Problem: Respiratory: Goal: Will maintain a patent airway Outcome: Progressing Goal: Complications related to the disease process, condition or treatment will be avoided or minimized Outcome: Progressing   

## 2019-02-09 LAB — CBC WITH DIFFERENTIAL/PLATELET
Abs Immature Granulocytes: 0.02 10*3/uL (ref 0.00–0.07)
Basophils Absolute: 0 10*3/uL (ref 0.0–0.1)
Basophils Relative: 0 %
Eosinophils Absolute: 0.2 10*3/uL (ref 0.0–0.5)
Eosinophils Relative: 4 %
HCT: 41.2 % (ref 39.0–52.0)
Hemoglobin: 12.1 g/dL — ABNORMAL LOW (ref 13.0–17.0)
Immature Granulocytes: 0 %
Lymphocytes Relative: 46 %
Lymphs Abs: 2.2 10*3/uL (ref 0.7–4.0)
MCH: 24 pg — ABNORMAL LOW (ref 26.0–34.0)
MCHC: 29.4 g/dL — ABNORMAL LOW (ref 30.0–36.0)
MCV: 81.7 fL (ref 80.0–100.0)
Monocytes Absolute: 0.6 10*3/uL (ref 0.1–1.0)
Monocytes Relative: 12 %
Neutro Abs: 1.8 10*3/uL (ref 1.7–7.7)
Neutrophils Relative %: 38 %
Platelets: 300 10*3/uL (ref 150–400)
RBC: 5.04 MIL/uL (ref 4.22–5.81)
RDW: 15.5 % (ref 11.5–15.5)
WBC: 4.8 10*3/uL (ref 4.0–10.5)
nRBC: 0 % (ref 0.0–0.2)

## 2019-02-09 LAB — COMPREHENSIVE METABOLIC PANEL
ALT: 30 U/L (ref 0–44)
AST: 26 U/L (ref 15–41)
Albumin: 3.6 g/dL (ref 3.5–5.0)
Alkaline Phosphatase: 43 U/L (ref 38–126)
Anion gap: 11 (ref 5–15)
BUN: 21 mg/dL — ABNORMAL HIGH (ref 6–20)
CO2: 24 mmol/L (ref 22–32)
Calcium: 8.7 mg/dL — ABNORMAL LOW (ref 8.9–10.3)
Chloride: 98 mmol/L (ref 98–111)
Creatinine, Ser: 1.26 mg/dL — ABNORMAL HIGH (ref 0.61–1.24)
GFR calc Af Amer: >60 mL/min (ref >60)
GFR calc non Af Amer: >60 mL/min (ref >60)
Glucose, Bld: 85 mg/dL (ref 70–99)
Potassium: 5.1 mmol/L (ref 3.5–5.1)
Sodium: 133 mmol/L — ABNORMAL LOW (ref 135–145)
Total Bilirubin: <0.1 mg/dL — ABNORMAL LOW (ref 0.3–1.2)
Total Protein: 8.1 g/dL (ref 6.5–8.1)

## 2019-02-09 LAB — GLUCOSE, CAPILLARY
Glucose-Capillary: 90 mg/dL (ref 70–99)
Glucose-Capillary: 108 mg/dL — ABNORMAL HIGH (ref 70–99)
Glucose-Capillary: 139 mg/dL — ABNORMAL HIGH (ref 70–99)
Glucose-Capillary: 146 mg/dL — ABNORMAL HIGH (ref 70–99)

## 2019-02-09 LAB — D-DIMER, QUANTITATIVE: D-Dimer, Quant: 0.28 ug/mL-FEU (ref 0.00–0.50)

## 2019-02-09 LAB — INTERLEUKIN-6, PLASMA: Interleukin-6, Plasma: 9.6 pg/mL (ref 0.0–12.2)

## 2019-02-09 LAB — FERRITIN: Ferritin: 100 ng/mL (ref 24–336)

## 2019-02-09 LAB — C-REACTIVE PROTEIN: CRP: 1.1 mg/dL — ABNORMAL HIGH (ref <1.0)

## 2019-02-09 LAB — PROTIME-INR
INR: 2 — ABNORMAL HIGH (ref 0.8–1.2)
Prothrombin Time: 22.5 seconds — ABNORMAL HIGH (ref 11.4–15.2)

## 2019-02-09 MED ORDER — WARFARIN SODIUM 7.5 MG PO TABS
17.5000 mg | ORAL_TABLET | Freq: Once | ORAL | Status: AC
  Administered 2019-02-09: 17.5 mg via ORAL
  Filled 2019-02-09: qty 1

## 2019-02-09 NOTE — Plan of Care (Signed)
  Problem: Education: Goal: Knowledge of General Education information will improve Description Including pain rating scale, medication(s)/side effects and non-pharmacologic comfort measures Outcome: Progressing   Problem: Clinical Measurements: Goal: Diagnostic test results will improve Outcome: Progressing Goal: Respiratory complications will improve Outcome: Progressing Goal: Cardiovascular complication will be avoided Outcome: Progressing   Problem: Activity: Goal: Risk for activity intolerance will decrease Outcome: Progressing   Problem: Nutrition: Goal: Adequate nutrition will be maintained Outcome: Progressing   Problem: Coping: Goal: Level of anxiety will decrease Outcome: Progressing   Problem: Safety: Goal: Ability to remain free from injury will improve Outcome: Progressing   Problem: Skin Integrity: Goal: Risk for impaired skin integrity will decrease Outcome: Progressing   Problem: Education: Goal: Knowledge of risk factors and measures for prevention of condition will improve Outcome: Progressing   Problem: Coping: Goal: Psychosocial and spiritual needs will be supported Outcome: Progressing   Problem: Respiratory: Goal: Will maintain a patent airway Outcome: Progressing Goal: Complications related to the disease process, condition or treatment will be avoided or minimized Outcome: Progressing

## 2019-02-09 NOTE — Progress Notes (Signed)
PROGRESS NOTE    MICHAELPAUL APO  ZOX:[id number] DOB: [birth year] DOA: 02/06/2019 PCP: System, Pcp Not In      Brief Narrative:  Mr. Lacher is a 53 y.o. M with DM, MO, OSA on CPAP, new pAfib, and HTN who was recently admitted with leg swelling, dyspnea, found to have PE, started on Lovenox bridge to warfarin who was discharged 5/7, felt out of breath since discharge, but in last few days, symptoms notable much worse, then syncopized twice.  Returned to ER, concern for "my clots are back".  In ER, CTA chest showed no PE, but did shows bilateral peripheral GGOs, concerning for COVID and SARS-CoV-2 testing positive.      Assessment & Plan:  SARS-CoV-2 pneumonia with acute hypoxic respiratory failure CT chest shows ground glass opacities.  Higher risk for worsening respiratory failure and intubation given obesity, diabetes.    In setting of ongoing 2020 COVID-19 pandemic.    Dimer, ferritin, CRP reassuing but troponin 0.1.  Oxygen requirements stable overnight.  No respiratory distress.   -Continue supplemental oxygen -On therapeutic dose antiocagulation for other indication -Continue zinc and Vitamin C -Daily d-dimer, ferritin and CRP  This patient has confirmed COVID-19 in the setting of the ongoing 2020 coronavirus pandemic.  He has hypoxia, but expected to survive >48 hours and has good baseline functional status.  He is not known to be on immunomodulators, anti-rejection medications, or cancer chemotherapy, has no history of TB or latent TB, and no history of diverticulitis or intestinal perforation.  Platelets are >50K, ANC is >500, and ALT/AST are below 5x ULN with no known hepatitis B infection.  -If CRP, ferritin, dimer, troponin trend up, will discuss off-label use of Actemra     Acute kidney injury Baseline creatinine 0.9.  Up to 1.5 yesterday, improved to 1.2 today.  Fractional excretion of sodium suggests prerenal injury. -Hold lisinopril, diuretic -Repeat  creatinine  Hyponatremia Mild, asymptomatic  Hyperkalemia Potassium 5.1, trending up. -Trend potassium  Recent PE INR therapeutic today -Continue Lovenox bridge for 1 more day -Continue warfarin, dosing discussed with pharmacy    Atrial fibrillation, paroxysmal Rate is controlled. -Continue labetalol -Continue warfarin   Chronic diastolic CHF Hypertension Appears euvolemic -Continue amlodipine, labetalol, spironolactone -Hold Lasix, lisinopril  Diabetes Glucoses well controlled -Continue sliding scale correction insulin -Hold metformin -Continue atorvastatin   OSA -CPAP at night  Morbid obesity Body mass index is 58.72 kg/m.  Other  -Continue Robaxin, PPI         MDM and disposition: The below labs and imaging reports reviewed and summarized above.  Medication management as above.  The patient was admitted with acute hypoxic respiratory failure from coronavirus pneumonia.  He is still requiring supplemental oxygen, and has signs of endorgan injury, including a troponin elevation.  This is an acute illness that poses a threat to life or bodily function.  Anticoagulation is managed and an extensive number of diagnoses or treatment options were managed.        DVT prophylaxis: N/A on therapeutic dose Lovenox Code Status: FULL Family Communication: None    Consultants:   None  Procedures:   None  Antimicrobials:   None    Subjective: No fever, vomiting, diarrhea.  Chest feels tight but no dyspnea with exertion.  Muscle spasm still present.  No dizziness, syncope, vomiting.    Objective: Vitals:   02/09/19 0400 02/09/19 0732 02/09/19 0800 02/09/19 0804  BP: 131/71  (!) 145/98   Pulse:   81  82  Resp: Temp: 98.9 F (37.2 C) 98.1 F (36.7 C)    TempSrc: Oral Oral    SpO2:   100% 100%  Weight:      Height:        Intake/Output Summary (Last 24 hours) at 02/09/2019 1107 Last data filed at 02/09/2019 2130 Gross per 24  hour  Intake 1680 ml  Output 1550 ml  Net 130 ml   Filed Weights   02/06/19 2100  Weight: (!) 188 kg    Examination: General appearance: Obese adult male, sitting in recliner, no acute distress HEENT: Anicteric, conjunctival pink, lids and lashes normal.  No nasal deformity, discharge, or epistaxis.  Lips moist, dentition normal, oropharynx moist, no oral lesions, hearing normal. Skin: Skin warm and dry without suspicious rashes or lesions. Cardiac: Regular rate and rhythm, no murmurs appreciated, JVP not visible due to body habitus, no lower extremity edema. Respiratory: Normal respiratory rate and rhythm, lungs diminished bilaterally, no wheezing or rales. Abdomen: Abdomen soft, no tenderness palpation, no ascites.  Distention appears to be from adipose. MSK: No deformities or effusions. Neuro: Awake and alert, extraocular movements intact, moves all extremities, speech fluent. Psych: Sensorium intact and responding to questions, attention normal, affect normal, judgment and insight appear normal.    Data Reviewed: I have personally reviewed following labs and imaging studies:  CBC: Recent Labs  Lab 02/06/19 1156 02/07/19 0650 02/08/19 1332 02/09/19 0252  WBC 5.6 6.0 6.4 4.8  NEUTROABS 2.8 3.5  --  1.8  HGB 13.0 13.2 12.2* 12.1*  HCT 43.2 45.1 42.3 41.2  MCV 81.1 81.6 81.5 81.7  PLT 298 324 331 300   Basic Metabolic Panel: Recent Labs  Lab 02/06/19 1156 02/07/19 0650 02/09/19 0252  NA 136 134* 133*  K 4.9 4.3 5.1  CL 102 97* 98  CO2 21* 28 24  GLUCOSE 80 110* 85  BUN 14 14 21*  CREATININE 1.18 1.48* 1.26*  CALCIUM 8.8* 8.8* 8.7*   GFR: Estimated Creatinine Clearance: 115.5 mL/min (A) (by C-G formula based on SCr of 1.26 mg/dL (H)). Liver Function Tests: Recent Labs  Lab 02/06/19 1156 02/07/19 0650 02/09/19 0252  AST ALT 33 31 30  ALKPHOS 44 47 43  BILITOT 0.5 0.2* <0.1*  PROT 7.8 8.2* 8.1  ALBUMIN 3.4* 3.6 3.6   No results for input(s):  LIPASE, AMYLASE in the last 168 hours. No results for input(s): AMMONIA in the last 168 hours. Coagulation Profile: Recent Labs  Lab 02/06/19 1156 02/07/19 0650 02/08/19 1332 02/09/19 0252  INR 1.5* 1.5* 1.9* 2.0*   Cardiac Enzymes: Recent Labs  Lab 02/06/19 1156 02/06/19 2058 02/07/19 0650 02/07/19 1301  TROPONINI 0.11* 0.13* 0.12* 0.10*   BNP (last 3 results) No results for input(s): PROBNP in the last 8760 hours. HbA1C: No results for input(s): HGBA1C in the last 72 hours. CBG: Recent Labs  Lab 02/08/19 0708 02/08/19 0749 02/08/19 1152 02/08/19 2120 02/09/19 0730  GLUCAP 97 110* 123* 100* 90   Lipid Profile: No results for input(s): CHOL, HDL, LDLCALC, TRIG, CHOLHDL, LDLDIRECT in the last 72 hours. Thyroid Function Tests: No results for input(s): TSH, T4TOTAL, FREET4, T3FREE, THYROIDAB in the last 72 hours. Anemia Panel: Recent Labs    02/08/19 1000 02/09/19 0252  FERRITIN 91 100   Urine analysis:    Component Value Date/Time   COLORURINE YELLOW 02/07/2019 1042   APPEARANCEUR HAZY (A) 02/07/2019 1042   LABSPEC 1.043 (H) 02/07/2019  1042   PHURINE 5.0 02/07/2019 1042   GLUCOSEU NEGATIVE 02/07/2019 1042   HGBUR NEGATIVE 02/07/2019 1042   BILIRUBINUR NEGATIVE 02/07/2019 1042   KETONESUR NEGATIVE 02/07/2019 1042   PROTEINUR 30 (A) 02/07/2019 1042   NITRITE NEGATIVE 02/07/2019 1042   LEUKOCYTESUR NEGATIVE 02/07/2019 1042   Sepsis Labs: @LABRCNTIP (procalcitonin:4,lacticacidven:4)  ) Recent Results (from the past 240 hour(s))  SARS Coronavirus 2 (CEPHEID- Performed in [facility] hospital lab), Hosp Order     Status: Abnormal   Collection Time: 02/06/19  3:30 PM  Result Value Ref Range Status   SARS Coronavirus 2 POSITIVE (A) NEGATIVE Final    Comment: RESULT CALLED TO, READ BACK BY AND VERIFIED WITH: Azalee Course RN 1750 02/06/19 A BROWNING (NOTE) If result is NEGATIVE SARS-CoV-2 target nucleic acids are NOT DETECTED. The SARS-CoV-2 RNA is generally  detectable in upper and lower  respiratory specimens during the acute phase of infection. The lowest  concentration of SARS-CoV-2 viral copies this assay can detect is 250  copies / mL. A negative result does not preclude SARS-CoV-2 infection  and should not be used as the sole basis for treatment or other  patient management decisions.  A negative result may occur with  improper specimen collection / handling, submission of specimen other  than nasopharyngeal swab, presence of viral mutation(s) within the  areas targeted by this assay, and inadequate number of viral copies  (<250 copies / mL). A negative result must be combined with clinical  observations, patient history, and epidemiological information. If result is POSITIVE SARS-CoV-2 target nucleic acids are DETECTED. Th e SARS-CoV-2 RNA is generally detectable in upper and lower  respiratory specimens during the acute phase of infection.  Positive  results are indicative of active infection with SARS-CoV-2.  Clinical  correlation with patient history and other diagnostic information is  necessary to determine patient infection status.  Positive results do  not rule out bacterial infection or co-infection with other viruses. If result is PRESUMPTIVE POSTIVE SARS-CoV-2 nucleic acids MAY BE PRESENT.   A presumptive positive result was obtained on the submitted specimen  and confirmed on repeat testing.  While 2019 novel coronavirus  (SARS-CoV-2) nucleic acids may be present in the submitted sample  additional confirmatory testing may be necessary for epidemiological  and / or clinical management purposes  to differentiate between  SARS-CoV-2 and other Sarbecovirus currently known to infect humans.  If clinically indicated additional testing with an alternate test  methodology [phone]) is  advised. The SARS-CoV-2 RNA is generally  detectable in upper and lower respiratory specimens during the acute  phase of infection. The  expected result is Negative. Fact Sheet for Patients:  BoilerBrush.com.cy Fact Sheet for Healthcare Providers: https://pope.com/ This test is not yet approved or cleared by the [place] FDA and has been authorized for detection and/or diagnosis of SARS-CoV-2 by FDA under an Emergency Use Authorization (EUA).  This EUA will remain in effect (meaning this test can be used) for the duration of the COVID-19 declaration under Section 564(b)(1) of the Act, 21 U.S.C. section 360bbb-3(b)(1), unless the authorization is terminated or revoked sooner. Performed at [facility], [address] [place], [place] [address]          Radiology Studies: No results found.      Scheduled Meds: . amLODipine  10 mg Oral Daily  . atorvastatin  40 mg Oral QHS  . enoxaparin (LOVENOX) injection  190 mg Subcutaneous Q12H  . insulin aspart  0-5 Units  Subcutaneous QHS  . insulin aspart  0-9 Units Subcutaneous TID WC  . labetalol  200 mg Oral Daily  . mouth rinse  15 mL Mouth Rinse BID  . methocarbamol  500 mg Oral TID  . pantoprazole  40 mg Oral Daily  . polyvinyl alcohol  1-2 drop Both Eyes BID  . spironolactone  50 mg Oral Daily  . vitamin C  500 mg Oral Daily  . warfarin  17.5 mg Oral ONCE-1800  . Warfarin - Pharmacist Dosing Inpatient   Does not apply q1800  . zinc sulfate  220 mg Oral Daily   Continuous Infusions:    LOS: 3 days    Time spent: 35 minutes  During this encounter: Patient Isolation: Airborne, contact, droplet HCP PPE: Face shield, head covering, N95, gown, gloves, and shoe covers    Alberteen Sam, MD [facility] 02/09/2019, 11:07 AM     Please page through AMION:  [email or web address] Password [email or web address] If 7PM-7AM, please contact night-coverage

## 2019-02-09 NOTE — Progress Notes (Signed)
ANTICOAGULATION CONSULT NOTE  Pharmacy Consult for warfarin Indication: hx of PE, atrial fibrillation   No Known Allergies  Patient Measurements: Height: 5\' 11"  (180.3 cm) Weight: (!) 414 lb 7.4 oz (188 kg) IBW/kg (Calculated) : 75.3  Vital Signs: Temp: 98.1 F (36.7 C) (05/23 0732) Temp Source: Oral (05/23 0732) BP: 131/71 (05/23 0400) Pulse Rate: 86 (05/23 0000)  Labs: Recent Labs    02/06/19 1156 02/06/19 2058 02/07/19 0650 02/07/19 1301 02/08/19 1332 02/09/19 0252  HGB 13.0  --  13.2  --  12.2* 12.1*  HCT 43.2  --  45.1  --  42.3 41.2  PLT 298  --  324  --  331 300  LABPROT 17.5*  --  17.7*  --  21.4* 22.5*  INR 1.5*  --  1.5*  --  1.9* 2.0*  CREATININE 1.18  --  1.48*  --   --   --   TROPONINI 0.11* 0.13* 0.12* 0.10*  --   --     Estimated Creatinine Clearance: 98.3 mL/min (A) (by C-G formula based on SCr of 1.48 mg/dL (H)).  Assessment: 30 yoM with PMH of atrial fibrillation and recent acute PE who presented on 5/20 with dyspnea and syncope. INR 1.5 was subtherapeutic on admission.  Patient was previously discharged on 01/24/19 with prescriptions for lovenox 190mg  (150mg  + 40mg  syringes) and warfarin 15mg  daily and was instructed to follow-up as outpatient for INR check. Per patient, his pharmacy only had the 150mg  syringes available in stock, so he had only been using lovenox 150mg  SQ BID as bridge. He also stated that due to issues with scheduling at [facility], he was unable to be seen for follow up INR check after discharge. He does not report any missed doses of medication between admissions.   Pharmacy consulted for lovenox/warfarin dosing on admission.   INR 2, increased to lower limit of therapeutic range.   CBC: Hgb decreased to 12.1, relatively stable.  Pltc WNL No bleeding issues reported  Goal of Therapy:  INR 2-3 Monitor platelets by anticoagulation protocol: Yes   Plan:  - Continue lovenox 1mg /kg SQ q12h until INR therapeutic x24  hrs. - Repeat boosted Warfarin 17.5mg  PO x 1 today - Daily PT/INR - Monitor CBC and for s/sx of bleeding  Lynann Beaver PharmD, BCPS 02/09/2019 7:36 AM

## 2019-02-09 NOTE — Progress Notes (Addendum)
0804  Sitting up in chair.  O2 sat 100% on 3l/[place].  Decreased O2 to 2l/[place]  1119  Attempted to update primary contact.  No answer at this time.    1410  Patient upset that we arent feeding him enough.  States his mother called our kitchen and told them to send him food.  Offered frozen meals we have available now.  He accepted (2 of them).  Juice and graham crackers also provided as requested.

## 2019-02-10 LAB — GLUCOSE, CAPILLARY
Glucose-Capillary: 112 mg/dL — ABNORMAL HIGH (ref 70–99)
Glucose-Capillary: 105 mg/dL — ABNORMAL HIGH (ref 70–99)
Glucose-Capillary: 118 mg/dL — ABNORMAL HIGH (ref 70–99)
Glucose-Capillary: 143 mg/dL — ABNORMAL HIGH (ref 70–99)

## 2019-02-10 LAB — PROTIME-INR
INR: 2.5 — ABNORMAL HIGH (ref 0.8–1.2)
Prothrombin Time: 26.3 s — ABNORMAL HIGH (ref 11.4–15.2)

## 2019-02-10 LAB — CBC WITH DIFFERENTIAL/PLATELET
Abs Immature Granulocytes: 0.02 K/uL (ref 0.00–0.07)
Basophils Absolute: 0 K/uL (ref 0.0–0.1)
Basophils Relative: 0 %
Eosinophils Absolute: 0.4 K/uL (ref 0.0–0.5)
Eosinophils Relative: 7 %
HCT: 42.1 % (ref 39.0–52.0)
Hemoglobin: 12.3 g/dL — ABNORMAL LOW (ref 13.0–17.0)
Immature Granulocytes: 0 %
Lymphocytes Relative: 37 %
Lymphs Abs: 1.9 K/uL (ref 0.7–4.0)
MCH: 23.8 pg — ABNORMAL LOW (ref 26.0–34.0)
MCHC: 29.2 g/dL — ABNORMAL LOW (ref 30.0–36.0)
MCV: 81.6 fL (ref 80.0–100.0)
Monocytes Absolute: 0.6 K/uL (ref 0.1–1.0)
Monocytes Relative: 12 %
Neutro Abs: 2.3 K/uL (ref 1.7–7.7)
Neutrophils Relative %: 44 %
Platelets: 324 K/uL (ref 150–400)
RBC: 5.16 MIL/uL (ref 4.22–5.81)
RDW: 15.3 % (ref 11.5–15.5)
WBC: 5.2 K/uL (ref 4.0–10.5)
nRBC: 0 % (ref 0.0–0.2)

## 2019-02-10 LAB — COMPREHENSIVE METABOLIC PANEL
ALT: 29 U/L (ref 0–44)
AST: 26 U/L (ref 15–41)
Albumin: 3.9 g/dL (ref 3.5–5.0)
Alkaline Phosphatase: 45 U/L (ref 38–126)
Anion gap: 8 (ref 5–15)
BUN: 17 mg/dL (ref 6–20)
CO2: 27 mmol/L (ref 22–32)
Calcium: 9.1 mg/dL (ref 8.9–10.3)
Chloride: 100 mmol/L (ref 98–111)
Creatinine, Ser: 1.01 mg/dL (ref 0.61–1.24)
GFR calc Af Amer: >60 mL/min (ref >60)
GFR calc non Af Amer: >60 mL/min (ref >60)
Glucose, Bld: 103 mg/dL — ABNORMAL HIGH (ref 70–99)
Potassium: 5.3 mmol/L — ABNORMAL HIGH (ref 3.5–5.1)
Sodium: 135 mmol/L (ref 135–145)
Total Bilirubin: 0.6 mg/dL (ref 0.3–1.2)
Total Protein: 8.7 g/dL — ABNORMAL HIGH (ref 6.5–8.1)

## 2019-02-10 LAB — C-REACTIVE PROTEIN: CRP: <0.8 mg/dL

## 2019-02-10 LAB — D-DIMER, QUANTITATIVE: D-Dimer, Quant: 0.28 ug{FEU}/mL (ref 0.00–0.50)

## 2019-02-10 LAB — FERRITIN: Ferritin: 90 ng/mL (ref 24–336)

## 2019-02-10 MED ORDER — FUROSEMIDE 20 MG PO TABS
80.0000 mg | ORAL_TABLET | Freq: Three times a day (TID) | ORAL | Status: DC
  Administered 2019-02-11 (×3): 80 mg via ORAL
  Filled 2019-02-10 (×3): qty 4

## 2019-02-10 MED ORDER — WARFARIN SODIUM 7.5 MG PO TABS
15.0000 mg | ORAL_TABLET | Freq: Once | ORAL | Status: AC
  Administered 2019-02-10: 15 mg via ORAL
  Filled 2019-02-10: qty 2

## 2019-02-10 MED ORDER — HYDRALAZINE HCL 20 MG/ML IJ SOLN: 5.0000 mg | Freq: Four times a day (QID) | INTRAMUSCULAR | Status: DC | PRN

## 2019-02-10 MED ORDER — FUROSEMIDE 20 MG PO TABS: 80.0000 mg | ORAL_TABLET | Freq: Three times a day (TID) | ORAL | Status: DC

## 2019-02-10 MED ORDER — FUROSEMIDE 10 MG/ML IJ SOLN
80.0000 mg | Freq: Two times a day (BID) | INTRAMUSCULAR | Status: AC
  Administered 2019-02-10 (×2): 80 mg via INTRAVENOUS
  Filled 2019-02-10 (×2): qty 8

## 2019-02-10 NOTE — Progress Notes (Signed)
PROGRESS NOTE    KARSTON HYLAND  ZOX:[id number] DOB: [birth year] DOA: 02/06/2019 PCP: System, Pcp Not In      Brief Narrative:  Mr. Laskowski is a 53 y.o. M with DM, MO, OSA on CPAP, new pAfib, and HTN who was recently admitted with leg swelling, dyspnea, found to have PE, started on Lovenox bridge to warfarin who was discharged 5/7, felt out of breath since discharge, but in last few days, symptoms notable much worse, then syncopized twice.  Returned to ER, concern for "my clots are back".  In ER, CTA chest showed no PE, but did shows bilateral peripheral GGOs, concerning for COVID and SARS-CoV-2 testing positive.      Assessment & Plan:  SARS-CoV-2 pneumonia with acute hypoxic respiratory failure In setting of ongoing 2020 COVID-19 pandemic.  CT chest on admission showed ground glass opacities.  Had normal inflammatory markers but low level troponin leak, and hx of obesity/OSA,diabetes.    Dimer, ferritin, CRP stable and normal.  O2 requirements stable, no new respiratory distress.   Lasix held last two days, and today patient feeling more swollen and wheezy  -Lasix IV 80 mg BID today then restart home Lasix 80 TID PO tomorrow     Acute kidney injury Baseline creatinine 0.9.  Up to 1.5 on admission, Lasix held, improved to 1.2.  Fractional excretion of sodium suggests prerenal injury. -Hold lisinopril -Resume Lasix -Trend Cr  Hyponatremia No change  Hyperkalemia K elevated, slightly -Trend K -Resume Lasix  Recent PE INR therapeutic, Lovenox overlapped 24 hours, INR stable. -Continue warfarin  Atrial fibrillation, paroxysmal Rate normal -Continue labetalol -Continue warfarin   Acute on chronic diastolic CHF Hypertension Swollen, wheezy today after holding Lasix 3 days. -Continue amlodipine, labetalol -Restart Lasix -Hold Spiro -Hold ACEi  -Hydralazine PRN for severe range pressures  Diabetes Glucose normal -Continue sliding scale correction  insulin -Hold metformin -Continue atorvastatin   OSA -CPAP at night  Morbid obesity Body mass index is 58.72 kg/m.  Other  -Continue Robaxin, PPI         MDM and disposition: The below labs and imaging reports reviewed and summarized above.  Medication management as above.  This is an acute illness that poses a threat to life or bodily function.  Anticoagulation is managed and an extensive number of diagnoses or treatment options were managed.   The patient was admitted with acute hypoxic respiratory failure from coronavirus pneumonia.  He is still requiring supplemental oxygen, although today this appears to be from congestive heart failure.  From the standpoint of coronavirus pneumonia, I suspect he is stable and ready for discharge home with home oxygen.  From the standpoint of congestive heart, we will diurese with IV Lasix for 1 day, reevaluate tomorrow.  May need additional IV Lasix tomorrow.  If his congestion is improved tomorrow, will plan for discharge home with supplemental oxygen and close follow-up, provided SW can arrange a safe discharge plan with his halfway house.          DVT prophylaxis: N/A on warfarin Code Status: FULL Family Communication: None    Consultants:   None  Procedures:   CTA chest 5/20: No PE  CT head 5/20 -- NAICP  CXR 5/20 -- congestion vascular  Antimicrobials:   None    Subjective: No fever, vomiting, diarrhea.  Leg swelling today.  No chest pain.  No fever or cough.      Objective: Vitals:   02/10/19 0433 02/10/19 0800 02/10/19 0806 02/10/19  0928  BP: (!) 169/99 (!) 177/116  (!) 149/85  Pulse: 83 91  85  Resp: 18   14  Temp: 98 F (36.7 C)  98.2 F (36.8 C)   TempSrc: Oral  Oral   SpO2: 92% 92%  96%  Weight:      Height:        Intake/Output Summary (Last 24 hours) at 02/10/2019 1127 Last data filed at 02/10/2019 1107 Gross per 24 hour  Intake 1800 ml  Output 5350 ml  Net -3550 ml   Filed  Weights   02/06/19 2100  Weight: (!) 188 kg    Examination: General appearance: Obese adult male, sitting in recliner, no acute distress, interactive HEENT: Anicteric, conjunctival pink, lids and lashes normal.  No nasal deformity, discharge, or epistaxis.  Lips moist, dentition normal, oropharynx moist, no oral lesions, hearing normal. Skin: Skin warm and dry without suspicious rashes or lesions. Cardiac: Regular rate and rhythm, no murmurs appreciated, JVP not visible due to habitus, but leg swelling noted Respiratory: Normal respiratory rate and rhythm, lungs diminished bilaterally, wheezing noted today, rales at bases Abdomen: Abdomen soft, no tenderness to palpation, no hepatosplenomegaly MSK: No deformities or effusions of the large joints, muscle bulk and tone normal. Neuro: Awake and alert, extraocular movements intact, moves all extremities with normal strength and coordination, speech fluent. Psych: Sensorium intact responding to questions, attention normal, affect normal, judgment and insight appear normal.    Data Reviewed: I have personally reviewed following labs and imaging studies:  CBC: Recent Labs  Lab 02/06/19 1156 02/07/19 0650 02/08/19 1332 02/09/19 0252 02/10/19 0505  WBC 5.6 6.0 6.4 4.8 5.2  NEUTROABS 2.8 3.5  --  1.8 2.3  HGB 13.0 13.2 12.2* 12.1* 12.3*  HCT 43.2 45.1 42.3 41.2 42.1  MCV 81.1 81.6 81.5 81.7 81.6  PLT 298 324 331 300 324   Basic Metabolic Panel: Recent Labs  Lab 02/06/19 1156 02/07/19 0650 02/09/19 0252 02/10/19 0505  NA 136 134* 133* 135  K 4.9 4.3 5.1 5.3*  CL 102 97* 98 100  CO2 21* 28 24 27   GLUCOSE 80 110* 85 103*  BUN 14 14 21* 17  CREATININE 1.18 1.48* 1.26* 1.01  CALCIUM 8.8* 8.8* 8.7* 9.1   GFR: Estimated Creatinine Clearance: 144 mL/min (by C-G formula based on SCr of 1.01 mg/dL). Liver Function Tests: Recent Labs  Lab 02/06/19 1156 02/07/19 0650 02/09/19 0252 02/10/19 0505  AST 30 26 26 26   ALT 33 31 30 29    ALKPHOS 44 47 43 45  BILITOT 0.5 0.2* <0.1* 0.6  PROT 7.8 8.2* 8.1 8.7*  ALBUMIN 3.4* 3.6 3.6 3.9   No results for input(s): LIPASE, AMYLASE in the last 168 hours. No results for input(s): AMMONIA in the last 168 hours. Coagulation Profile: Recent Labs  Lab 02/06/19 1156 02/07/19 0650 02/08/19 1332 02/09/19 0252 02/10/19 0505  INR 1.5* 1.5* 1.9* 2.0* 2.5*   Cardiac Enzymes: Recent Labs  Lab 02/06/19 1156 02/06/19 2058 02/07/19 0650 02/07/19 1301  TROPONINI 0.11* 0.13* 0.12* 0.10*   BNP (last 3 results) No results for input(s): PROBNP in the last 8760 hours. HbA1C: No results for input(s): HGBA1C in the last 72 hours. CBG: Recent Labs  Lab 02/09/19 0730 02/09/19 1201 02/09/19 1636 02/09/19 2125 02/10/19 0816  GLUCAP 90 108* 139* 146* 112*   Lipid Profile: No results for input(s): CHOL, HDL, LDLCALC, TRIG, CHOLHDL, LDLDIRECT in the last 72 hours. Thyroid Function Tests: No results for input(s): TSH, T4TOTAL, FREET4,  T3FREE, THYROIDAB in the last 72 hours. Anemia Panel: Recent Labs    02/09/19 0252 02/10/19 0505  FERRITIN 100 90   Urine analysis:    Component Value Date/Time   COLORURINE YELLOW 02/07/2019 1042   APPEARANCEUR HAZY (A) 02/07/2019 1042   LABSPEC 1.043 (H) 02/07/2019 1042   PHURINE 5.0 02/07/2019 1042   GLUCOSEU NEGATIVE 02/07/2019 1042   HGBUR NEGATIVE 02/07/2019 1042   BILIRUBINUR NEGATIVE 02/07/2019 1042   KETONESUR NEGATIVE 02/07/2019 1042   PROTEINUR 30 (A) 02/07/2019 1042   NITRITE NEGATIVE 02/07/2019 1042   LEUKOCYTESUR NEGATIVE 02/07/2019 1042   Sepsis Labs: (procalcitonin:4,lacticacidven:4)  ) Recent Results (from the past 240 hour(s))  SARS Coronavirus 2 (CEPHEID- Performed in [facility] hospital lab), Hosp Order     Status: Abnormal   Collection Time: 02/06/19  3:30 PM  Result Value Ref Range Status   SARS Coronavirus 2 POSITIVE (A) NEGATIVE Final    Comment: RESULT CALLED TO, READ BACK BY AND VERIFIED  WITH: Azalee Course RN 1750 02/06/19 A BROWNING (NOTE) If result is NEGATIVE SARS-CoV-2 target nucleic acids are NOT DETECTED. The SARS-CoV-2 RNA is generally detectable in upper and lower  respiratory specimens during the acute phase of infection. The lowest  concentration of SARS-CoV-2 viral copies this assay can detect is 250  copies / mL. A negative result does not preclude SARS-CoV-2 infection  and should not be used as the sole basis for treatment or other  patient management decisions.  A negative result may occur with  improper specimen collection / handling, submission of specimen other  than nasopharyngeal swab, presence of viral mutation(s) within the  areas targeted by this assay, and inadequate number of viral copies  (<250 copies / mL). A negative result must be combined with clinical  observations, patient history, and epidemiological information. If result is POSITIVE SARS-CoV-2 target nucleic acids are DETECTED. Th e SARS-CoV-2 RNA is generally detectable in upper and lower  respiratory specimens during the acute phase of infection.  Positive  results are indicative of active infection with SARS-CoV-2.  Clinical  correlation with patient history and other diagnostic information is  necessary to determine patient infection status.  Positive results do  not rule out bacterial infection or co-infection with other viruses. If result is PRESUMPTIVE POSTIVE SARS-CoV-2 nucleic acids MAY BE PRESENT.   A presumptive positive result was obtained on the submitted specimen  and confirmed on repeat testing.  While 2019 novel coronavirus  (SARS-CoV-2) nucleic acids may be present in the submitted sample  additional confirmatory testing may be necessary for epidemiological  and / or clinical management purposes  to differentiate between  SARS-CoV-2 and other Sarbecovirus currently known to infect humans.  If clinically indicated additional testing with an alternate test  methodology  [phone]) is  advised. The SARS-CoV-2 RNA is generally  detectable in upper and lower respiratory specimens during the acute  phase of infection. The expected result is Negative. Fact Sheet for Patients:  BoilerBrush.com.cy Fact Sheet for Healthcare Providers: https://pope.com/ This test is not yet approved or cleared by the [place] FDA and has been authorized for detection and/or diagnosis of SARS-CoV-2 by FDA under an Emergency Use Authorization (EUA).  This EUA will remain in effect (meaning this test can be used) for the duration of the COVID-19 declaration under Section 564(b)(1) of the Act, 21 U.S.C. section 360bbb-3(b)(1), unless the authorization is terminated or revoked sooner. Performed at [name], [address], [place], [place] [address]  Radiology Studies: No results found.      Scheduled Meds:  amLODipine  10 mg Oral Daily   atorvastatin  40 mg Oral QHS   furosemide  80 mg Intravenous BID   [START ON 02/11/2019] furosemide  80 mg Oral TID   insulin aspart  0-5 Units Subcutaneous QHS   insulin aspart  0-9 Units Subcutaneous TID WC   labetalol  200 mg Oral Daily   mouth rinse  15 mL Mouth Rinse BID   methocarbamol  500 mg Oral TID   pantoprazole  40 mg Oral Daily   polyvinyl alcohol  1-2 drop Both Eyes BID   vitamin C  500 mg Oral Daily   warfarin  15 mg Oral ONCE-1800   Warfarin - Pharmacist Dosing Inpatient   Does not apply q1800   zinc sulfate  220 mg Oral Daily   Continuous Infusions:    LOS: 4 days    Time spent: 35 minutes  During this encounter: Patient Isolation: Airborne, contact, droplet HCP PPE: Face shield, head covering, N95, gown, gloves, and shoe covers    Alberteen Sam, MD [facility] 02/10/2019, 11:27 AM     Please page through AMION:  [email or web address] Password [email or web address] If 7PM-7AM, please contact night-coverage

## 2019-02-10 NOTE — Care Management (Signed)
Case manager will continue to monitor patient for appropriate disposition as he continues to improve. May God Bless him to do so.   Vance Peper, RN BSN Case Manager  [phone]

## 2019-02-10 NOTE — Progress Notes (Signed)
ANTICOAGULATION CONSULT NOTE  Pharmacy Consult for warfarin Indication: hx of PE, atrial fibrillation   No Known Allergies  Patient Measurements: Height: 5\' 11"  (180.3 cm) Weight: (!) 414 lb 7.4 oz (188 kg) IBW/kg (Calculated) : 75.3  Vital Signs: Temp: 98 F (36.7 C) (05/24 0433) Temp Source: Oral (05/24 0433) BP: 169/99 (05/24 0433) Pulse Rate: 83 (05/24 0433)  Labs: Recent Labs    02/07/19 1301  02/08/19 1332 02/09/19 0252 02/10/19 0505  HGB  --    < > 12.2* 12.1* 12.3*  HCT  --   --  42.3 41.2 42.1  PLT  --   --  331 300 324  LABPROT  --   --  21.4* 22.5*  --   INR  --   --  1.9* 2.0*  --   CREATININE  --   --   --  1.26*  --   TROPONINI 0.10*  --   --   --   --    < > = values in this interval not displayed.    Estimated Creatinine Clearance: 115.5 mL/min (A) (by C-G formula based on SCr of 1.26 mg/dL (H)).  Assessment: 29 yoM with PMH of atrial fibrillation and recent acute PE who presented on 5/20 with dyspnea and syncope. INR 1.5 was subtherapeutic on admission.  Patient was previously discharged on 01/24/19 with prescriptions for lovenox 190mg  (150mg  + 40mg  syringes) and warfarin 15mg  daily and was instructed to follow-up as outpatient for INR check. Per patient, his pharmacy only had the 150mg  syringes available in stock, so he had only been using lovenox 150mg  SQ BID as bridge. He also stated that due to issues with scheduling at [facility], he was unable to be seen for follow up INR check after discharge. He does not report any missed doses of medication between admissions.   Pharmacy consulted for lovenox/warfarin dosing on admission.   INR 2.5, increased to therapeutic range.   CBC: Hgb 12.3, relatively stable.  Pltc WNL No bleeding issues reported  Goal of Therapy:  INR 2-3 Monitor platelets by anticoagulation protocol: Yes   Plan:  - D/c lovenox - Warfarin 15mg  PO x 1 today - Daily PT/INR - Monitor CBC and for s/sx of  bleeding  Lynann Beaver PharmD, BCPS 02/10/2019 7:06 AM

## 2019-02-10 NOTE — TOC Initial Note (Signed)
Transition of Care [facility]) - Initial/Assessment Note    Patient Details  Name: Benjamin [name] MRN: [id number] Date of Birth: [birth year]  [facility] CM/SW Contact:    Mry Lamia, Joyice Faster, LCSW Phone Number: 02/10/2019, 10:12 PM  Clinical Narrative:                  Clinical Social Worker spoke with patient over the phone to offer support and discuss patient needs at discharge.  Patient states that he is a resident at the halfway house [facility]).  Facility Representative Quinn Plowman states that patient is able to return, however the home is still waiting on two residents pending test so unable to self isolate until pending results are provided.  Hopeful for discharge back to facility on Tuesday 5/26.  MD updated of patient discharge plan.  CSW remains available for support and to facilitate patient discharge needs once bed available.  Expected Discharge Plan: Home/Self Care(Halfway House) Barriers to Discharge: Barriers Unresolved (comment)(COVID Positive - inability to self isolate)   Patient Goals and CMS Choice Patient states their goals for this hospitalization and ongoing recovery are:: Return to his home   Choice offered to / list presented to : NA  Expected Discharge Plan and Services Expected Discharge Plan: Home/Self Care(Halfway House) In-house Referral: NA Discharge Planning Services: CM Consult Post Acute Care Choice: NA Living arrangements for the past 2 months: Boarding House                 DME Arranged: Oxygen                    Prior Living Arrangements/Services Living arrangements for the past 2 months: [employer] Lives with:: Roommate Patient language and need for interpreter reviewed:: Yes Do you feel safe going back to the place where you live?: Yes      Need for Family Participation in Patient Care: Yes (Comment) Care giver support system in place?: Yes (comment)   Criminal Activity/Legal Involvement Pertinent to  Current Situation/Hospitalization: No - Comment as needed  Activities of Daily Living Home Assistive Devices/Equipment: None ADL Screening (condition at time of admission) Patient's cognitive ability adequate to safely complete daily activities?: Yes Is the patient deaf or have difficulty hearing?: No Does the patient have difficulty seeing, even when wearing glasses/contacts?: No Does the patient have difficulty concentrating, remembering, or making decisions?: No Patient able to express need for assistance with ADLs?: Yes Does the patient have difficulty dressing or bathing?: No Independently performs ADLs?: Yes (appropriate for developmental age) Does the patient have difficulty walking or climbing stairs?: No Weakness of Legs: None Weakness of Arms/Hands: None  Permission Sought/Granted Permission sought to share information with : Facility Industrial/product designer granted to share information with : Yes, Verbal Permission Granted  Share Information with NAME: Quinn Plowman  Permission granted to share info w AGENCY: [employer]  Permission granted to share info w Relationship: Facility Rep  Permission granted to share info w Contact Information: [phone]  Ext 304  Emotional Assessment Appearance:: Appears stated age Attitude/Demeanor/Rapport: Engaged Affect (typically observed): Accepting, Calm Orientation: : Oriented to Self, Oriented to Situation, Oriented to Place, Oriented to  Time Alcohol / Substance Use: Not Applicable Psych Involvement: No (comment)  Admission diagnosis:  Respiratory distress [R06.03] COVID-19 virus infection [U07.1] Patient Active Problem List   Diagnosis Date Noted  . Dyspnea 02/06/2019  . COVID-19 virus infection 02/06/2019  . Elevated troponin 02/06/2019  . Bilateral lower  extremity edema   . Obesity, Class III, BMI 40-49.9 (morbid obesity) (HCC)   . Acute on chronic diastolic CHF (congestive heart failure) (HCC)   . PAF  (paroxysmal atrial fibrillation) (HCC)   . Acute pulmonary embolism (HCC) 01/21/2019  . Hypertensive urgency 01/21/2019  . Diabetes mellitus type 2 in obese (HCC) 01/21/2019   PCP:  System, Pcp Not In Pharmacy:   [facility] [phone] - [place], [address] - [address] AT [facility] [name] [place] [address] Phone: [phone] Fax: [phone]     Social Determinants of Health (SDOH) Interventions    Readmission Risk Interventions No flowsheet data found.

## 2019-02-10 NOTE — Progress Notes (Signed)
0732  Sitting up in chair with eyes closed. O2 sat 90% on 2L.

## 2019-02-10 NOTE — Plan of Care (Signed)
  Problem: Clinical Measurements: Goal: Diagnostic test results will improve Outcome: Progressing Goal: Respiratory complications will improve Outcome: Progressing Goal: Cardiovascular complication will be avoided Outcome: Progressing   Problem: Activity: Goal: Risk for activity intolerance will decrease Outcome: Progressing   Problem: Nutrition: Goal: Adequate nutrition will be maintained Outcome: Progressing   Problem: Skin Integrity: Goal: Risk for impaired skin integrity will decrease Outcome: Progressing   Problem: Education: Goal: Knowledge of risk factors and measures for prevention of condition will improve Outcome: Progressing   Problem: Coping: Goal: Psychosocial and spiritual needs will be supported Outcome: Progressing   Problem: Respiratory: Goal: Will maintain a patent airway Outcome: Progressing Goal: Complications related to the disease process, condition or treatment will be avoided or minimized Outcome: Progressing

## 2019-02-11 LAB — CBC WITH DIFFERENTIAL/PLATELET
Abs Immature Granulocytes: 0.02 10*3/uL (ref 0.00–0.07)
Basophils Absolute: 0 10*3/uL (ref 0.0–0.1)
Basophils Relative: 1 %
Eosinophils Absolute: 0.4 10*3/uL (ref 0.0–0.5)
Eosinophils Relative: 8 %
HCT: 40.4 % (ref 39.0–52.0)
Hemoglobin: 11.7 g/dL — ABNORMAL LOW (ref 13.0–17.0)
Immature Granulocytes: 0 %
Lymphocytes Relative: 40 %
Lymphs Abs: 2.2 10*3/uL (ref 0.7–4.0)
MCH: 23.5 pg — ABNORMAL LOW (ref 26.0–34.0)
MCHC: 29 g/dL — ABNORMAL LOW (ref 30.0–36.0)
MCV: 81.1 fL (ref 80.0–100.0)
Monocytes Absolute: 0.6 10*3/uL (ref 0.1–1.0)
Monocytes Relative: 11 %
Neutro Abs: 2.2 10*3/uL (ref 1.7–7.7)
Neutrophils Relative %: 40 %
Platelets: 345 10*3/uL (ref 150–400)
RBC: 4.98 MIL/uL (ref 4.22–5.81)
RDW: 15.3 % (ref 11.5–15.5)
WBC: 5.5 10*3/uL (ref 4.0–10.5)
nRBC: 0 % (ref 0.0–0.2)

## 2019-02-11 LAB — COMPREHENSIVE METABOLIC PANEL
ALT: 32 U/L (ref 0–44)
AST: 27 U/L (ref 15–41)
Albumin: 3.5 g/dL (ref 3.5–5.0)
Alkaline Phosphatase: 40 U/L (ref 38–126)
Anion gap: 10 (ref 5–15)
BUN: 20 mg/dL (ref 6–20)
CO2: 29 mmol/L (ref 22–32)
Calcium: 8.8 mg/dL — ABNORMAL LOW (ref 8.9–10.3)
Chloride: 96 mmol/L — ABNORMAL LOW (ref 98–111)
Creatinine, Ser: 1.16 mg/dL (ref 0.61–1.24)
GFR calc Af Amer: >60 mL/min (ref >60)
GFR calc non Af Amer: >60 mL/min (ref >60)
Glucose, Bld: 104 mg/dL — ABNORMAL HIGH (ref 70–99)
Potassium: 4.5 mmol/L (ref 3.5–5.1)
Sodium: 135 mmol/L (ref 135–145)
Total Bilirubin: 0.2 mg/dL — ABNORMAL LOW (ref 0.3–1.2)
Total Protein: 7.9 g/dL (ref 6.5–8.1)

## 2019-02-11 LAB — GLUCOSE, CAPILLARY
Glucose-Capillary: 103 mg/dL — ABNORMAL HIGH (ref 70–99)
Glucose-Capillary: 119 mg/dL — ABNORMAL HIGH (ref 70–99)
Glucose-Capillary: 125 mg/dL — ABNORMAL HIGH (ref 70–99)

## 2019-02-11 LAB — PROTIME-INR
INR: 3 — ABNORMAL HIGH (ref 0.8–1.2)
Prothrombin Time: 30.3 seconds — ABNORMAL HIGH (ref 11.4–15.2)

## 2019-02-11 LAB — FERRITIN: Ferritin: 86 ng/mL (ref 24–336)

## 2019-02-11 LAB — C-REACTIVE PROTEIN: CRP: <0.8 mg/dL (ref <1.0)

## 2019-02-11 LAB — D-DIMER, QUANTITATIVE: D-Dimer, Quant: 0.27 ug/mL-FEU (ref 0.00–0.50)

## 2019-02-11 MED ORDER — WARFARIN SODIUM 2.5 MG PO TABS
2.5000 mg | ORAL_TABLET | Freq: Once | ORAL | Status: AC
  Administered 2019-02-11: 2.5 mg via ORAL
  Filled 2019-02-11: qty 1

## 2019-02-11 NOTE — Progress Notes (Signed)
ANTICOAGULATION CONSULT NOTE  Pharmacy Consult for warfarin Indication: hx of PE, atrial fibrillation   No Known Allergies  Patient Measurements: Height: 5\' 11"  (180.3 cm) Weight: (!) 414 lb 7.4 oz (188 kg) IBW/kg (Calculated) : 75.3  Vital Signs: Temp: 98.5 F (36.9 C) (05/25 0738) Temp Source: Oral (05/25 0738) BP: 160/109 (05/25 0738) Pulse Rate: 84 (05/25 0738)  Labs: Recent Labs    02/09/19 0252 02/10/19 0505 02/11/19 0500  HGB 12.1* 12.3* 11.7*  HCT 41.2 42.1 40.4  PLT 300 324 345  LABPROT 22.5* 26.3* 30.3*  INR 2.0* 2.5* 3.0*  CREATININE 1.26* 1.01 1.16    Estimated Creatinine Clearance: 125.4 mL/min (by C-G formula based on SCr of 1.16 mg/dL).  Assessment: 36 yoM with PMH of atrial fibrillation and recent acute PE who presented on 5/20 with dyspnea and syncope. INR 1.5 was subtherapeutic on admission.  Patient was previously discharged on 01/24/19 with prescriptions for lovenox 190mg  (150mg  + 40mg  syringes) and warfarin 15mg  daily and was instructed to follow-up as outpatient for INR check. Per patient, his pharmacy only had the 150mg  syringes available in stock, so he had only been using lovenox 150mg  SQ BID as bridge. He also stated that due to issues with scheduling at [facility], he was unable to be seen for follow up INR check after discharge. He does not report any missed doses of medication between admissions.   Pharmacy consulted for lovenox/warfarin dosing on admission.   INR continues to trend up to 3 today. We will back down on the dose today. Hgb down slightly. Plt wnl.  Goal of Therapy:  INR 2-3 Monitor platelets by anticoagulation protocol: Yes   Plan:   - Warfarin 2.5mg  PO x 1 today - Daily PT/INR - Monitor CBC and for s/sx of bleeding  Ulyses Southward, PharmD, BCIDP, AAHIVP, CPP Infectious Disease Pharmacist 02/11/2019 8:33 AM

## 2019-02-11 NOTE — Progress Notes (Signed)
PROGRESS NOTE    BECKHEM ISADORE  IRW:[id number] DOB: April 30, 1966 DOA: 02/06/2019 PCP: System, Pcp Not In      Brief Narrative:  Mr. Ravenscroft is a 53 y.o. M with DM, MO, OSA on CPAP, new pAfib, and HTN who was recently admitted with leg swelling, dyspnea, found to have PE, started on Lovenox bridge to warfarin who was discharged 5/7, felt out of breath since discharge, but in last few days, symptoms notable much worse, then syncopized twice.  Returned to ER, concern for "my clots are back".  In ER, CTA chest showed no PE, but did shows bilateral peripheral GGOs, concerning for COVID and SARS-CoV-2 testing positive.      Assessment & Plan:  SARS-CoV-2 pneumonia with acute hypoxic respiratory failure In setting of ongoing 2020 COVID-19 pandemic.  CT chest on admission showed ground glass opacities.  Had normal inflammatory markers but low level troponin leak, and hx of obesity/OSA,diabetes.    O2 requirements have remained stable and low throughout the last 4 days.  His inflammatory markers were low, and have normalized.          Acute kidney injury Baseline creatinine 0.9.  Up to 1.5 on admission, fractional excretion of sodium suggested prerenal azotemia, Lasix held, improved to 1.2.   In last 24 hours, Cr improved with diuresis of 3L -Resume lisinopril -Continue lasix -Trend Cr  ACute diastolic CHF Swelling improved today, no wheezing anymore. -Resume home Lasix  Hyponatremia Resolved  Hyperkalemia K improved -Trend K  Recent PE Admitted with INR subtherapeutic.  Lovenox bridge started, INR corrected.  Now INR trending up, will hold most of Warfarin dose tonight (just 2.5 mg from 15 mg yesterday) and repeat INR tomorrow  -Continue warfarin  Atrial fibrillation, paroxysmal Rate normal -Continue labetalol -Continue warfarin   Hypertension BP normal -Continue amlodipine, labetalol, Lasix -Resume spironolactone -Trend K -Hydralazine PRN for severe  range pressures  Diabetes Glucose good -Continue sliding scale correction insulin -Hold metformin -Continue atorvastatin   OSA -CPAP at night  Morbid obesity Body mass index is 58.72 kg/m.  Other  -Continue Robaxin, PPI         MDM and disposition: The below labs and imaging reports reviewed and summarized above.  Medication management as above.   The patient was admitted with acute hypoxic respiratory failure from coronavirus pneumonia.  Still requires new supplemental oxygen.  Transition to oral Lasix today and monitor for 24 hours.  If urine output good on oral Lasix, likely home tomorrow, pending arrangement of a safe discharge plan by her social worker.           DVT prophylaxis: N/A on warfarin Code Status: FULL Family Communication: None    Consultants:   None  Procedures:   CTA chest 5/20: No PE  CT head 5/20 -- NAICP  CXR 5/20 -- congestion vascular  Antimicrobials:   None    Subjective: No fever overnight.  No abdominal pain, vomiting, diarrhea.  No chest pain, dyspnea, sputum production.        Objective: Vitals:   02/11/19 0500 02/11/19 0600 02/11/19 0700 02/11/19 0738  BP:  (!) 145/91  (!) 160/109  Pulse: 84 84 83 84  Resp: 20 (!) 0 15 18  Temp:    98.5 F (36.9 C)  TempSrc:    Oral  SpO2: 92% 95% 94% 92%  Weight:      Height:        Intake/Output Summary (Last 24 hours) at 02/11/2019 1020  Last data filed at 02/11/2019 0746 Gross per 24 hour  Intake 480 ml  Output 4325 ml  Net -3845 ml   Filed Weights   02/06/19 2100  Weight: (!) 188 kg    Examination: General appearance: Obese adult male, lying in bed, no acute distress HEENT: Anicteric, conjunctival pink, lids and lashes normal.  No nasal deformity, discharge, or epistaxis.  Lips moist, dentition normal, oropharynx moist, no oral lesions, hearing normal. Skin: Skin warm and dry without suspicious rashes or lesions. Cardiac: Regular rate and rhythm, no  murmurs appreciated, JVP not visible due to body habitus, chronic venous stasis changes, minimal pitting Respiratory: Normal respiratory rate and rhythm, lungs diminished bilaterally, no wheezing, no rales Abdomen: Abdomen soft, no tenderness to palpation, no hepatosplenomegaly. MSK: No deformities or effusions of the large joints, muscle bulk and tone normal. Neuro: Awake and alert, extraocular movements intact, moves all extremities with normal strength and coordination, speech fluent. Psych: Sensorium intact and responding to questions, attention normal, affect normal, judgment and insight appear normal.    Data Reviewed: I have personally reviewed following labs and imaging studies:  CBC: Recent Labs  Lab 02/06/19 1156 02/07/19 0650 02/08/19 1332 02/09/19 0252 02/10/19 0505 02/11/19 0500  WBC 5.6 6.0 6.4 4.8 5.2 5.5  NEUTROABS 2.8 3.5  --  1.8 2.3 2.2  HGB 13.0 13.2 12.2* 12.1* 12.3* 11.7*  HCT 43.2 45.1 42.3 41.2 42.1 40.4  MCV 81.1 81.6 81.5 81.7 81.6 81.1  PLT 298 324 331 300 324 345   Basic Metabolic Panel: Recent Labs  Lab 02/06/19 1156 02/07/19 0650 02/09/19 0252 02/10/19 0505 02/11/19 0500  NA 136 134* 133* 135 135  K 4.9 4.3 5.1 5.3* 4.5  CL 102 97* 98 100 96*  CO2 21* 28 24 27 29   GLUCOSE 80 110* 85 103* 104*  BUN 14 14 21* 17 20  CREATININE 1.18 1.48* 1.26* 1.01 1.16  CALCIUM 8.8* 8.8* 8.7* 9.1 8.8*   GFR: Estimated Creatinine Clearance: 125.4 mL/min (by C-G formula based on SCr of 1.16 mg/dL). Liver Function Tests: Recent Labs  Lab 02/06/19 1156 02/07/19 0650 02/09/19 0252 02/10/19 0505 02/11/19 0500  AST 30 26 26 26 27   ALT 33 31 30 29  32  ALKPHOS 44 47 43 45 40  BILITOT 0.5 0.2* <0.1* 0.6 0.2*  PROT 7.8 8.2* 8.1 8.7* 7.9  ALBUMIN 3.4* 3.6 3.6 3.9 3.5   No results for input(s): LIPASE, AMYLASE in the last 168 hours. No results for input(s): AMMONIA in the last 168 hours. Coagulation Profile: Recent Labs  Lab 02/07/19 0650 02/08/19  1332 02/09/19 0252 02/10/19 0505 02/11/19 0500  INR 1.5* 1.9* 2.0* 2.5* 3.0*   Cardiac Enzymes: Recent Labs  Lab 02/06/19 1156 02/06/19 2058 02/07/19 0650 02/07/19 1301  TROPONINI 0.11* 0.13* 0.12* 0.10*   BNP (last 3 results) No results for input(s): PROBNP in the last 8760 hours. HbA1C: No results for input(s): HGBA1C in the last 72 hours. CBG: Recent Labs  Lab 02/10/19 0816 02/10/19 1151 02/10/19 1727 02/10/19 1956 02/11/19 0706  GLUCAP 112* 105* 118* 143* 103*   Lipid Profile: No results for input(s): CHOL, HDL, LDLCALC, TRIG, CHOLHDL, LDLDIRECT in the last 72 hours. Thyroid Function Tests: No results for input(s): TSH, T4TOTAL, FREET4, T3FREE, THYROIDAB in the last 72 hours. Anemia Panel: Recent Labs    02/09/19 0252 02/10/19 0505  FERRITIN 100 90   Urine analysis:    Component Value Date/Time   COLORURINE YELLOW 02/07/2019 1042   APPEARANCEUR HAZY (  A) 02/07/2019 1042   LABSPEC 1.043 (H) 02/07/2019 1042   PHURINE 5.0 02/07/2019 1042   GLUCOSEU NEGATIVE 02/07/2019 1042   HGBUR NEGATIVE 02/07/2019 1042   BILIRUBINUR NEGATIVE 02/07/2019 1042   KETONESUR NEGATIVE 02/07/2019 1042   PROTEINUR 30 (A) 02/07/2019 1042   NITRITE NEGATIVE 02/07/2019 1042   LEUKOCYTESUR NEGATIVE 02/07/2019 1042   Sepsis Labs: @LABRCNTIP (procalcitonin:4,lacticacidven:4)  ) Recent Results (from the past 240 hour(s))  SARS Coronavirus 2 (CEPHEID- Performed in [facility] hospital lab), Hosp Order     Status: Abnormal   Collection Time: 02/06/19  3:30 PM  Result Value Ref Range Status   SARS Coronavirus 2 POSITIVE (A) NEGATIVE Final    Comment: RESULT CALLED TO, READ BACK BY AND VERIFIED WITH: Azalee Course RN 1750 02/06/19 A BROWNING (NOTE) If result is NEGATIVE SARS-CoV-2 target nucleic acids are NOT DETECTED. The SARS-CoV-2 RNA is generally detectable in upper and lower  respiratory specimens during the acute phase of infection. The lowest  concentration of SARS-CoV-2 viral  copies this assay can detect is 250  copies / mL. A negative result does not preclude SARS-CoV-2 infection  and should not be used as the sole basis for treatment or other  patient management decisions.  A negative result may occur with  improper specimen collection / handling, submission of specimen other  than nasopharyngeal swab, presence of viral mutation(s) within the  areas targeted by this assay, and inadequate number of viral copies  (<250 copies / mL). A negative result must be combined with clinical  observations, patient history, and epidemiological information. If result is POSITIVE SARS-CoV-2 target nucleic acids are DETECTED. Th e SARS-CoV-2 RNA is generally detectable in upper and lower  respiratory specimens during the acute phase of infection.  Positive  results are indicative of active infection with SARS-CoV-2.  Clinical  correlation with patient history and other diagnostic information is  necessary to determine patient infection status.  Positive results do  not rule out bacterial infection or co-infection with other viruses. If result is PRESUMPTIVE POSTIVE SARS-CoV-2 nucleic acids MAY BE PRESENT.   A presumptive positive result was obtained on the submitted specimen  and confirmed on repeat testing.  While 2019 novel coronavirus  (SARS-CoV-2) nucleic acids may be present in the submitted sample  additional confirmatory testing may be necessary for epidemiological  and / or clinical management purposes  to differentiate between  SARS-CoV-2 and other Sarbecovirus currently known to infect humans.  If clinically indicated additional testing with an alternate test  methodology [phone]) is  advised. The SARS-CoV-2 RNA is generally  detectable in upper and lower respiratory specimens during the acute  phase of infection. The expected result is Negative. Fact Sheet for Patients:  BoilerBrush.com.cy Fact Sheet for Healthcare Providers:  https://pope.com/ This test is not yet approved or cleared by the [place] FDA and has been authorized for detection and/or diagnosis of SARS-CoV-2 by FDA under an Emergency Use Authorization (EUA).  This EUA will remain in effect (meaning this test can be used) for the duration of the COVID-19 declaration under Section 564(b)(1) of the Act, 21 U.S.C. section 360bbb-3(b)(1), unless the authorization is terminated or revoked sooner. Performed at [facility], [name], [place], [place] [address]          Radiology Studies: No results found.      Scheduled Meds: . amLODipine  10 mg Oral Daily  . atorvastatin  40 mg Oral QHS  . furosemide  80 mg Oral TID  .  insulin aspart  0-5 Units Subcutaneous QHS  . insulin aspart  0-9 Units Subcutaneous TID WC  . labetalol  200 mg Oral Daily  . mouth rinse  15 mL Mouth Rinse BID  . methocarbamol  500 mg Oral TID  . pantoprazole  40 mg Oral Daily  . polyvinyl alcohol  1-2 drop Both Eyes BID  . vitamin C  500 mg Oral Daily  . warfarin  2.5 mg Oral ONCE-1800  . Warfarin - Pharmacist Dosing Inpatient   Does not apply q1800  . zinc sulfate  220 mg Oral Daily   Continuous Infusions:    LOS: 5 days    Time spent: 25 minutes  During this encounter: Patient Isolation: Airborne, contact, droplet HCP PPE: Face shield, head covering, N95, gown, gloves, and shoe covers    Alberteen Sam, MD [facility] 02/11/2019, 10:20 AM     Please page through AMION:  [email or web address] Password [email or web address] If 7PM-7AM, please contact night-coverage

## 2019-02-12 LAB — BASIC METABOLIC PANEL
Anion gap: 9 (ref 5–15)
BUN: 21 mg/dL — ABNORMAL HIGH (ref 6–20)
CO2: 30 mmol/L (ref 22–32)
Calcium: 9.1 mg/dL (ref 8.9–10.3)
Chloride: 97 mmol/L — ABNORMAL LOW (ref 98–111)
Creatinine, Ser: 1.21 mg/dL (ref 0.61–1.24)
GFR calc Af Amer: >60 mL/min (ref >60)
GFR calc non Af Amer: >60 mL/min (ref >60)
Glucose, Bld: 111 mg/dL — ABNORMAL HIGH (ref 70–99)
Potassium: 4.6 mmol/L (ref 3.5–5.1)
Sodium: 136 mmol/L (ref 135–145)

## 2019-02-12 LAB — GLUCOSE, CAPILLARY
Glucose-Capillary: 151 mg/dL — ABNORMAL HIGH (ref 70–99)
Glucose-Capillary: 109 mg/dL — ABNORMAL HIGH (ref 70–99)
Glucose-Capillary: 101 mg/dL — ABNORMAL HIGH (ref 70–99)
Glucose-Capillary: 211 mg/dL — ABNORMAL HIGH (ref 70–99)
Glucose-Capillary: 92 mg/dL (ref 70–99)
Glucose-Capillary: 124 mg/dL — ABNORMAL HIGH (ref 70–99)

## 2019-02-12 LAB — PROTIME-INR
INR: 2.7 — ABNORMAL HIGH (ref 0.8–1.2)
Prothrombin Time: 28.3 seconds — ABNORMAL HIGH (ref 11.4–15.2)

## 2019-02-12 MED ORDER — FUROSEMIDE 40 MG PO TABS
80.0000 mg | ORAL_TABLET | Freq: Three times a day (TID) | ORAL | Status: DC
  Administered 2019-02-12 – 2019-02-13 (×4): 80 mg via ORAL
  Filled 2019-02-12 (×2): qty 2
  Filled 2019-02-12: qty 4
  Filled 2019-02-12 (×3): qty 2

## 2019-02-12 MED ORDER — METHOCARBAMOL 500 MG PO TABS: 500.0000 mg | ORAL_TABLET | Freq: Three times a day (TID) | ORAL | 0 refills | Status: DC | PRN

## 2019-02-12 MED ORDER — WARFARIN SODIUM 7.5 MG PO TABS
15.0000 mg | ORAL_TABLET | Freq: Once | ORAL | Status: AC
  Administered 2019-02-12: 15 mg via ORAL
  Filled 2019-02-12: qty 2

## 2019-02-12 NOTE — TOC Progression Note (Signed)
Transition of Care [facility]) - Progression Note    Patient Details  Name: Benjamin [name] MRN: [id number] Date of Birth: [birth year]  Transition of Care [facility]) CM/SW Contact  Doy Hutching, [place] Phone Number: 02/12/2019, 12:03 PM  Clinical Narrative:    Message left for Quinn Plowman at at [facility] where pt resides. Await return call to ensure pt able to safely quarantine when he discharges.    Expected Discharge Plan: Home/Self Care(Halfway House) Barriers to Discharge: Barriers Unresolved (comment)(COVID Positive - inability to self isolate)  Expected Discharge Plan and Services Expected Discharge Plan: Home/Self Care(Halfway House) In-house Referral: NA Discharge Planning Services: CM Consult Post Acute Care Choice: NA Living arrangements for the past 2 months: Boarding House Expected Discharge Date: 02/12/19               DME Arranged: Oxygen       Social Determinants of Health (SDOH) Interventions    Readmission Risk Interventions No flowsheet data found.

## 2019-02-12 NOTE — TOC Initial Note (Addendum)
Transition of Care [facility]) - Initial/Assessment Note    Patient Details  Name: Benjamin [name] MRN: [id number] Date of Birth: Jun 25, 1966  Transition of Care [facility]) CM/SW Contact:    Durenda Guthrie, RN Phone Number: 02/12/2019, 12:05 PM  Clinical Narrative: 53 yr old gentleman admitted and treated for COVID 19. Case manager spoke with Quinn Plowman at [facility] house where patient resides. They will not be able to receive patient back until Thursday, 5/28, at which time he will be able to be quarantined alone. Case manager discussed patient's need for Tele-Visit for follow up appointment. Ms. Alvester Morin states that residents do not have phones but a phone will be in the room. The contact number will be [phone], option [phone], then request patient by name and the call will be transferred to him. Case Manager has arranged an appointment for patient for Monday, February 18, 2019 at 2:30pm with [name], NP at [facility]. CM will call [facility] and provide them with the information on how to contact patient.                    Expected Discharge Plan: Home/Self Care(Halfway House) Barriers to Discharge: Barriers Unresolved (comment)(COVID Positive - inability to self isolate)   Patient Goals and CMS Choice Patient states their goals for this hospitalization and ongoing recovery are:: Return to his home   Choice offered to / list presented to : NA  Expected Discharge Plan and Services Expected Discharge Plan: Home/Self Care(Halfway House) In-house Referral: NA Discharge Planning Services: CM Consult Post Acute Care Choice: NA Living arrangements for the past 2 months: Boarding House Expected Discharge Date: 02/12/19               DME Arranged: Oxygen                    Prior Living Arrangements/Services Living arrangements for the past 2 months: [employer] Lives with:: Roommate Patient language and need for interpreter reviewed:: Yes Do you feel safe going  back to the place where you live?: Yes      Need for Family Participation in Patient Care: Yes (Comment) Care giver support system in place?: Yes (comment)   Criminal Activity/Legal Involvement Pertinent to Current Situation/Hospitalization: No - Comment as needed  Activities of Daily Living Home Assistive Devices/Equipment: None ADL Screening (condition at time of admission) Patient's cognitive ability adequate to safely complete daily activities?: Yes Is the patient deaf or have difficulty hearing?: No Does the patient have difficulty seeing, even when wearing glasses/contacts?: No Does the patient have difficulty concentrating, remembering, or making decisions?: No Patient able to express need for assistance with ADLs?: Yes Does the patient have difficulty dressing or bathing?: No Independently performs ADLs?: Yes (appropriate for developmental age) Does the patient have difficulty walking or climbing stairs?: No Weakness of Legs: None Weakness of Arms/Hands: None  Permission Sought/Granted Permission sought to share information with : Facility Industrial/product designer granted to share information with : Yes, Verbal Permission Granted  Share Information with NAME: Quinn Plowman  Permission granted to share info w AGENCY: [employer]  Permission granted to share info w Relationship: Facility Rep  Permission granted to share info w Contact Information: [phone]  Ext [phone]  Emotional Assessment Appearance:: Appears stated age Attitude/Demeanor/Rapport: Engaged Affect (typically observed): Accepting, Calm Orientation: : Oriented to Self, Oriented to Situation, Oriented to Place, Oriented to  Time Alcohol / Substance Use: Not Applicable Psych Involvement: No (comment)  Admission diagnosis:  Respiratory distress [R06.03] COVID-19 virus infection [U07.1] Patient Active Problem List   Diagnosis Date Noted  . Dyspnea 02/06/2019  . COVID-19 virus infection 02/06/2019   . Elevated troponin 02/06/2019  . Bilateral lower extremity edema   . Obesity, Class III, BMI 40-49.9 (morbid obesity) (HCC)   . Acute on chronic diastolic CHF (congestive heart failure) (HCC)   . PAF (paroxysmal atrial fibrillation) (HCC)   . Acute pulmonary embolism (HCC) 01/21/2019  . Hypertensive urgency 01/21/2019  . Diabetes mellitus type 2 in obese (HCC) 01/21/2019   PCP:  System, Pcp Not In Pharmacy:   [facility] [phone] - [place], [place] - [address] AT [facility] [name]  [place] [address] Phone: [phone] Fax: [phone]     Social Determinants of Health (SDOH) Interventions    Readmission Risk Interventions No flowsheet data found.

## 2019-02-12 NOTE — Progress Notes (Signed)
ANTICOAGULATION CONSULT NOTE  Pharmacy Consult for warfarin Indication: hx of PE, atrial fibrillation   No Known Allergies  Patient Measurements: Height: 5\' 11"  (180.3 cm) Weight: (!) 414 lb 7.4 oz (188 kg) IBW/kg (Calculated) : 75.3  Vital Signs: Temp: 97.6 F (36.4 C) (05/26 0804) Temp Source: Oral (05/26 0804) BP: 137/106 (05/26 0804) Pulse Rate: 86 (05/26 0804)  Labs: Recent Labs    02/10/19 0505 02/11/19 0500 02/12/19 0533  HGB 12.3* 11.7*  --   HCT 42.1 40.4  --   PLT 324 345  --   LABPROT 26.3* 30.3* 28.3*  INR 2.5* 3.0* 2.7*  CREATININE 1.01 1.16 1.21    Estimated Creatinine Clearance: 120.2 mL/min (by C-G formula based on SCr of 1.21 mg/dL).  Assessment: 27 yoM with PMH of atrial fibrillation and recent acute PE who presented on 5/20 with dyspnea and syncope. INR 1.5 was subtherapeutic on admission.  Patient was previously discharged on 01/24/19 with prescriptions for lovenox 190mg  (150mg  + 40mg  syringes) and warfarin 15mg  daily and was instructed to follow-up as outpatient for INR check. Per patient, his pharmacy only had the 150mg  syringes available in stock, so he had only been using lovenox 150mg  SQ BID as bridge. He also stated that due to issues with scheduling at [facility], he was unable to be seen for follow up INR check after discharge. He does not report any missed doses of medication between admissions.   Pharmacy consulted for lovenox/warfarin dosing on admission.   INR is back in range so we will resume the high dose due to his wt.   Goal of Therapy:  INR 2-3 Monitor platelets by anticoagulation protocol: Yes   Plan:   - Warfarin 15mg  PO x 1 today - Daily PT/INR - Monitor CBC and for s/sx of bleeding  Ulyses Southward, PharmD, BCIDP, AAHIVP, CPP Infectious Disease Pharmacist 02/12/2019 8:35 AM

## 2019-02-12 NOTE — Discharge Summary (Addendum)
Benjamin [name]:[id number] DOB: January 13, 1966 DOA: 02/06/2019  PCP: System, Pcp Not In  Admit date: 02/06/2019  Discharge date: 02/14/2019  Admitted From: Home   Disposition:  Per SW   Recommendations for Outpatient Follow-up:   Follow up with PCP in 2-3 days  PCP Please obtain BMP/CBC, 2 view CXR in 1week,  (see Discharge instructions)   PCP Please follow up on the following pending results: INR and BMP in 2-3 days   Home Health: None   Equipment/Devices: o2 2 lit  Consultations: None Discharge Condition: Stable   CODE STATUS: Full   Diet Recommendation: Heart Healthy Low Carb, 1.5lit/day fluid restriction.    Chief Complaint  Patient presents with   Respiratory Distress   Loss of Consciousness     Brief history of present illness from the day of admission and additional interim summary    [name] is a 53 y.o. M with DM, MO, OSA on CPAP, new pAfib, and HTN who was recently admitted with leg swelling, dyspnea, found to have PE, started on Lovenox bridge to warfarin who was discharged 5/7, felt out of breath since discharge, but in last few days, symptoms notable much worse, then syncopized twice, him to the ER where he was diagnosed with COVID-19 infection and admitted to the hospital.                                                                 Hospital Course   SARS-CoV-2 pneumonia with acute hypoxic respiratory failure  -  In setting of ongoing 2020 COVID-19 pandemic.  CT chest on admission showed ground glass opacities.  Had normal inflammatory markers but low level troponin leak, and hx of obesity/OSA,diabetes.    He was treated conservatively and now back to baseline, currently on 2 L nasal cannula oxygen which I think is due to underlying massive morbid obesity and related obesity  hypoventilation syndrome.  Upon sitting up his pulse ox improved considerably and he can even be titrated down to room air however will be discharged on home oxygen as whenever he lays down even in the daytime or reclines back in chair he drops his pulse ox into low 80s.  Encouraged to follow with PCP within a week.  Encouraged to sit up in chair and daytime.  Overall symptom-free.  Acute kidney injury -  Baseline creatinine 0.9.  Up to 1.5 on admission, fractional excretion of sodium suggested prerenal azotemia, Lasix held, improved to 1.2.   Resume home medications, PCP to monitor BMP, urine output and diuretic and ACE inhibitor dose.    Acute on chronic diastolic CHF 65% on recent echocardiogram.  Has been adequately diuresed now back to baseline home dose beta-blocker and diuretics along with ACE inhibitor resumed.   Hyponatremia due to fluid overload resolved after  diuresis.    Hyperkalemia Resolved after diuresis.  Recent PE Was bridged with Lovenox and Coumadin INR therapeutic will be discharged on home dose Coumadin with INR follow-up with PCP.  Atrial fibrillation, paroxysmal with [place] vas 2 score of 3. In sinus, continue home dose beta-blocker and Coumadin.    Hypertension Pressure stable home regimen resumed.   OSA -CPAP at night  Morbid obesity Body mass index is 58.72 kg/m.  DM2 - Glucophage resumed, PCP to monitor glycemic control.  Lab Results  Component Value Date   HGBA1C 7.6 (H) 01/23/2019   CBG (last 3)  Recent Labs    02/13/19 1646 02/13/19 2201 02/14/19 0729  GLUCAP 106* 116* 100*     Discharge diagnosis     Principal Problem:   COVID-19 virus infection Active Problems:   Diabetes mellitus type 2 in obese (HCC)   PAF (paroxysmal atrial fibrillation) (HCC)   Dyspnea   Elevated troponin    Discharge instructions    Discharge Instructions    MyChart COVID-19 home monitoring program   Complete by:  Feb 12, 2019    Is the  patient willing to use the MyChart Mobile App for home monitoring?:  Yes   Temperature monitoring   Complete by:  Feb 12, 2019    After how many days would you like to receive a notification of this patient's flowsheet entries?:  1   Discharge instructions   Complete by:  As directed    Follow with Primary MD in 2-3 days   Get CBC, CMP, INR -  checked  by Primary MD in 2-3 days   Activity: As tolerated with Full fall precautions use walker/cane & assistance as needed  Disposition Home   Diet: Heart Healthy Low Carb, 0.5 L/day fluid restriction  Special Instructions: If you have smoked or chewed Tobacco  in the last 2 yrs please stop smoking, stop any regular Alcohol  and or any Recreational drug use.  On your next visit with your primary care physician please Get Medicines reviewed and adjusted.  Please request your Prim.MD to go over all Hospital Tests and Procedure/Radiological results at the follow up, please get all Hospital records sent to your Prim MD by signing hospital release before you go home.  If you experience worsening of your admission symptoms, develop shortness of breath, life threatening emergency, suicidal or homicidal thoughts you must seek medical attention immediately by calling 911 or calling your MD immediately  if symptoms less severe.  You Must read complete instructions/literature along with all the possible adverse reactions/side effects for all the Medicines you take and that have been prescribed to you. Take any new Medicines after you have completely understood and accpet all the possible adverse reactions/side effects.   Increase activity slowly   Complete by:  As directed       Discharge Medications   Allergies as of 02/14/2019   No Known Allergies     Medication List    STOP taking these medications   enoxaparin 150 MG/ML injection Commonly known as:  LOVENOX   enoxaparin 40 MG/0.4ML injection Commonly known as:  LOVENOX   Visine Dry Eye  0.2-0.2-1 % Soln Generic drug:  Glycerin-Hypromellose-PEG 400     TAKE these medications   amLODipine 10 MG tablet Commonly known as:  NORVASC Take 10 mg by mouth daily.   atorvastatin 40 MG tablet Commonly known as:  LIPITOR Take 40 mg by mouth at bedtime.   COUGH DROPS MT 1 drop as  needed (for throat dryness).   furosemide 80 MG tablet Commonly known as:  LASIX Take 80 mg by mouth 3 (three) times daily.   labetalol 200 MG tablet Commonly known as:  NORMODYNE Take 200 mg by mouth daily.   lisinopril 40 MG tablet Commonly known as:  ZESTRIL Take 40 mg by mouth daily.   metFORMIN 500 MG tablet Commonly known as:  GLUCOPHAGE Take 500 mg by mouth 2 (two) times a day.   methocarbamol 500 MG tablet Commonly known as:  ROBAXIN Take 1 tablet (500 mg total) by mouth every 8 (eight) hours as needed for muscle spasms.   omeprazole 40 MG capsule Commonly known as:  PRILOSEC Take 40 mg by mouth daily before breakfast.   spironolactone 50 MG tablet Commonly known as:  ALDACTONE Take 50 mg by mouth daily.   triamcinolone cream 0.1 % Commonly known as:  KENALOG Apply 1 application topically 3 (three) times daily.   warfarin 5 MG tablet Commonly known as:  COUMADIN Take 1 tablet (5 mg total) by mouth daily after breakfast for 5 days. You must get your INR checked within 2 to 3 days and get dose adjusted. What changed:    medication strength  how much to take  additional instructions            Durable Medical Equipment  (From admission, onward)         Start     Ordered   02/12/19 1023  DME Oxygen  Once    Question Answer Comment  Length of Need 6 Months   Mode or (Route) Nasal cannula   Liters per Minute 2   Frequency Continuous (stationary and portable oxygen unit needed)   Oxygen conserving device Yes   Oxygen delivery system Gas      02/12/19 1022          Follow-up Information    [facility] Follow up.   Why:   you will receive a call for a Tele- Health visit on Monday, February 18, 2019 at 2:30pm with [name], NP  Please be available. Contact information: [address] [place] [address] [place] [address] [phone]       Chrystie Nose, MD. Schedule an appointment as soon as possible for a visit in 2 week(s).   Specialty:  Cardiology Why:  Echo Contact information: [address] [place] [address] [place] [address] [phone]           Major procedures and Radiology Reports - PLEASE review detailed and final reports thoroughly  -       Dg Chest 2 View  Result Date: 02/06/2019 CLINICAL DATA:  Shortness of breath and syncope EXAM: CHEST - 2 VIEW COMPARISON:  Chest radiograph and chest CT Jan 21, 2019 FINDINGS: There is no appreciable edema or consolidation. Heart is slightly enlarged with mild pulmonary venous hypertension. No adenopathy. There is aortic atherosclerosis. No evident bone lesions. IMPRESSION: Pulmonary vascular congestion. No frank edema or consolidation. Aortic Atherosclerosis (ICD10-I70.0). Electronically Signed   By: Bretta Bang III M.D.   On: 02/06/2019 13:05   Ct Head Wo Contrast  Result Date: 02/06/2019 CLINICAL DATA:  Syncope. EXAM: CT HEAD WITHOUT CONTRAST TECHNIQUE: Contiguous axial images were obtained from the base of the skull through the vertex without intravenous contrast. COMPARISON:  None. FINDINGS: Brain: No evidence of acute infarction, hemorrhage, hydrocephalus, extra-axial collection or mass lesion/mass effect. Vascular: No hyperdense vessel or unexpected calcification. Skull: Normal. Negative for fracture  or focal lesion. Sinuses/Orbits: No acute finding. Other: None. IMPRESSION: No acute intracranial abnormality. Electronically Signed   By: Katherine Mantlehristopher  Green M.D.   On: 02/06/2019 20:49   Ct Angio Chest Pe W And/or Wo Contrast  Result Date: 02/06/2019 CLINICAL DATA:  Shortness of breath. Recent pulmonary embolus. Subtherapeutic INR.  EXAM: CT ANGIOGRAPHY CHEST WITH CONTRAST TECHNIQUE: Multidetector CT imaging of the chest was performed using the standard protocol during bolus administration of intravenous contrast. Multiplanar CT image reconstructions and MIPs were obtained to evaluate the vascular anatomy. CONTRAST:  100mL OMNIPAQUE IOHEXOL 350 MG/ML SOLN COMPARISON:  CTA chest 01/21/2019 FINDINGS: Cardiovascular: --Pulmonary arteries: Satisfactory contrast bolus. Beam attenuation caused by patient body habitus limits assessment beyond the proximal segmental level.Within that limitation, there is no central pulmonary embolus. The bilateral lower lobe emboli visualized previously are not visible on the current study. The main pulmonary artery is mildly enlarged, measuring 3.6 cm. --Aorta: Limited opacification of the aorta due to bolus timing optimization for the pulmonary arteries. Conventional 3 vessel aortic branching pattern. The aortic course and caliber are normal. There is no aortic atherosclerosis. --Heart: Mild cardiomegaly. No pericardial effusion. Mediastinum/Nodes: No mediastinal, hilar or axillary lymphadenopathy. The visualized thyroid and thoracic esophageal course are unremarkable. Lungs/Pleura: Multifocal ground-glass opacities throughout both lungs. No pleural effusion or pneumothorax. Upper Abdomen: Contrast bolus timing is not optimized for evaluation of the abdominal organs. Within this limitation, the visualized organs of the upper abdomen are normal. Musculoskeletal: No chest wall abnormality. No acute or significant osseous findings. Review of the MIP images confirms the above findings. IMPRESSION: 1. Sensitivity limited by beam attenuation due to body habitus. No central pulmonary embolus. Previously identified emboli are not clearly visible on today's study. 2. Multifocal bilateral ground-glass opacity. This finding may be associated with viral pneumonia (COVID-19 or otherwise). Multifocal small embolic infarcts are  also a consideration. 3. Mild cardiomegaly and enlarged main pulmonary artery, suggesting pulmonary hypertension. Electronically Signed   By: Deatra RobinsonKevin  Herman M.D.   On: 02/06/2019 17:39   Ct Angio Chest Pe W And/or Wo Contrast  Result Date: 01/21/2019 CLINICAL DATA:  Left leg pain and swelling with chest pain, initial encounter EXAM: CT ANGIOGRAPHY CHEST WITH CONTRAST TECHNIQUE: Multidetector CT imaging of the chest was performed using the standard protocol during bolus administration of intravenous contrast. Multiplanar CT image reconstructions and MIPs were obtained to evaluate the vascular anatomy. CONTRAST:  100mL OMNIPAQUE IOHEXOL 350 MG/ML SOLN COMPARISON:  None. FINDINGS: Cardiovascular: Thoracic aorta shows no significant atherosclerotic calcifications or aneurysmal dilatation. No coronary calcifications are seen. Heart is at the upper limits of normal in size. Pulmonary artery demonstrates a normal branching pattern with multiple filling defects in the lower lobes bilaterally consistent with pulmonary emboli. Mediastinum/Nodes: No hilar or mediastinal adenopathy is noted. The esophagus as visualized is within normal limits. The thoracic inlet is unremarkable. Lungs/Pleura: Lungs are well aerated bilaterally without focal infiltrate or sizable effusion. No parenchymal nodules are seen. Upper Abdomen: Visualized upper abdomen is within normal limits. Musculoskeletal: Bony structures show degenerative change of the thoracic spine. No acute rib abnormality is seen. Surrounding soft tissue structures are within normal limits. Review of the MIP images confirms the above findings. IMPRESSION: Bilateral lower lobe pulmonary emboli. No definitive right heart strain is seen. Electronically Signed   By: Alcide CleverMark  Lukens M.D.   On: 01/21/2019 20:08   Dg Chest Portable 1 View  Result Date: 01/21/2019 CLINICAL DATA:  Shortness of breath EXAM: PORTABLE CHEST 1 VIEW COMPARISON:  12/15/2010 FINDINGS: Cardiac silhouette is  significantly enlarged. There is no pneumothorax. No large pleural effusion. Segmental atelectasis is noted at the lung bases. There is some developing volume overload. IMPRESSION: Cardiomegaly with findings concerning for developing volume overload/pulmonary edema. Electronically Signed   By: Katherine Mantle M.D.   On: 01/21/2019 17:02   Vas [place] Lower Extremity Venous (dvt) (only [facility])  Result Date: 01/22/2019  Lower Venous Study Indications: Pain.  Limitations: Body habitus and Very limited visualization due to depth of vessels/ body habitus. Performing Technologist: Jeb Levering RDMS, RVT  Examination Guidelines: A complete evaluation includes B-mode imaging, spectral Doppler, color Doppler, and power Doppler as needed of all accessible portions of each vessel. Bilateral testing is considered an integral part of a complete examination. Limited examinations for reoccurring indications may be performed as noted.  +---------+---------------+---------+-----------+----------+--------------+  LEFT      Compressibility Phasicity Spontaneity Properties Summary         +---------+---------------+---------+-----------+----------+--------------+  CFV                                                        Not visualized  +---------+---------------+---------+-----------+----------+--------------+  SFJ                                                        Not visualized  +---------+---------------+---------+-----------+----------+--------------+  FV Prox                                                    Not visualized  +---------+---------------+---------+-----------+----------+--------------+  FV Mid                                                     Not visualized  +---------+---------------+---------+-----------+----------+--------------+  FV Distal                                                  Not visualized  +---------+---------------+---------+-----------+----------+--------------+  POP       Full             Yes       Yes                                    +---------+---------------+---------+-----------+----------+--------------+  PTV       Full                                                             +---------+---------------+---------+-----------+----------+--------------+  PERO  Not visualized  +---------+---------------+---------+-----------+----------+--------------+     Summary: Left: Very limited, mostly inconclusive study of the left lower extremity. No DVT noted in areas visualized.  *See table(s) above for measurements and observations. Electronically signed by Fabienne Bruns MD on 01/22/2019 at 2:36:23 AM.    Final     Micro Results     Recent Results (from the past 240 hour(s))  SARS Coronavirus 2 (CEPHEID- Performed in [facility] hospital lab), Hosp Order     Status: Abnormal   Collection Time: 02/06/19  3:30 PM  Result Value Ref Range Status   SARS Coronavirus 2 POSITIVE (A) NEGATIVE Final    Comment: RESULT CALLED TO, READ BACK BY AND VERIFIED WITH: Azalee Course RN 1750 02/06/19 A BROWNING (NOTE) If result is NEGATIVE SARS-CoV-2 target nucleic acids are NOT DETECTED. The SARS-CoV-2 RNA is generally detectable in upper and lower  respiratory specimens during the acute phase of infection. The lowest  concentration of SARS-CoV-2 viral copies this assay can detect is 250  copies / mL. A negative result does not preclude SARS-CoV-2 infection  and should not be used as the sole basis for treatment or other  patient management decisions.  A negative result may occur with  improper specimen collection / handling, submission of specimen other  than nasopharyngeal swab, presence of viral mutation(s) within the  areas targeted by this assay, and inadequate number of viral copies  (<250 copies / mL). A negative result must be combined with clinical  observations, patient history, and epidemiological information. If result is  POSITIVE SARS-CoV-2 target nucleic acids are DETECTED. Th e SARS-CoV-2 RNA is generally detectable in upper and lower  respiratory specimens during the acute phase of infection.  Positive  results are indicative of active infection with SARS-CoV-2.  Clinical  correlation with patient history and other diagnostic information is  necessary to determine patient infection status.  Positive results do  not rule out bacterial infection or co-infection with other viruses. If result is PRESUMPTIVE POSTIVE SARS-CoV-2 nucleic acids MAY BE PRESENT.   A presumptive positive result was obtained on the submitted specimen  and confirmed on repeat testing.  While 2019 novel coronavirus  (SARS-CoV-2) nucleic acids may be present in the submitted sample  additional confirmatory testing may be necessary for epidemiological  and / or clinical management purposes  to differentiate between  SARS-CoV-2 and other Sarbecovirus currently known to infect humans.  If clinically indicated additional testing with an alternate test  methodology [phone]) is  advised. The SARS-CoV-2 RNA is generally  detectable in upper and lower respiratory specimens during the acute  phase of infection. The expected result is Negative. Fact Sheet for Patients:  BoilerBrush.com.cy Fact Sheet for Healthcare Providers: https://pope.com/ This test is not yet approved or cleared by the [place] FDA and has been authorized for detection and/or diagnosis of SARS-CoV-2 by FDA under an Emergency Use Authorization (EUA).  This EUA will remain in effect (meaning this test can be used) for the duration of the COVID-19 declaration under Section 564(b)(1) of the Act, 21 U.S.C. section 360bbb-3(b)(1), unless the authorization is terminated or revoked sooner. Performed at [facility], [address], [place], [place] [address]     Today   Subjective    Benjamin [name] today  has no headache,no chest abdominal pain,no new weakness tingling or numbness, feels much better wants to go home today.    Objective   Blood pressure 127/82, pulse 91, temperature 97.7 F (36.5 C), temperature source  Oral, resp. rate 20, height  (1.803 m), weight (!) 188 kg, SpO2 92 %.   Intake/Output Summary (Last 24 hours) at 02/14/2019 0948 Last data filed at 02/14/2019 0500 Gross per 24 hour  Intake 1800 ml  Output 800 ml  Net 1000 ml    Exam  Awake Alert, Oriented x 3, No new F.N deficits, Normal affect [place].AT,PERRAL Supple Neck,No JVD, No cervical lymphadenopathy appriciated.  Symmetrical Chest wall movement, Good air movement bilaterally, CTAB RRR,No Gallops,Rubs or new Murmurs, No Parasternal Heave +ve B.Sounds, Abd Soft, Non tender, No organomegaly appriciated, No rebound -guarding or rigidity. No Cyanosis, Clubbing , 1+ leg edema, No new Rash or bruise   Data Review   CBC w Diff:  Lab Results  Component Value Date   WBC 5.5 02/11/2019   HGB 11.7 (L) 02/11/2019   HCT 40.4 02/11/2019   PLT 345 02/11/2019   LYMPHOPCT 40 02/11/2019   MONOPCT 11 02/11/2019   EOSPCT 8 02/11/2019   BASOPCT 1 02/11/2019    CMP:  Lab Results  Component Value Date   NA 134 (L) 02/13/2019   K 4.4 02/13/2019   CL 94 (L) 02/13/2019   CO2 31 02/13/2019   BUN 27 (H) 02/13/2019   CREATININE 1.24 02/13/2019   PROT 7.9 02/11/2019   ALBUMIN 3.5 02/11/2019   BILITOT 0.2 (L) 02/11/2019   ALKPHOS 40 02/11/2019   AST 27 02/11/2019   ALT 32 02/11/2019  . Lab Results  Component Value Date   INR 3.0 (H) 02/14/2019   INR 2.7 (H) 02/13/2019   INR 2.7 (H) 02/12/2019    CBG (last 3)  Recent Labs    02/13/19 1646 02/13/19 2201 02/14/19 0729  GLUCAP 106* 116* 100*     Total Time in preparing paper work, data evaluation and todays exam - 35 minutes  Susa Raring M.D on 02/14/2019 at 9:48 AM  [facility]   Office  [phone]

## 2019-02-13 LAB — BASIC METABOLIC PANEL
Anion gap: 9 (ref 5–15)
BUN: 27 mg/dL — ABNORMAL HIGH (ref 6–20)
CO2: 31 mmol/L (ref 22–32)
Calcium: 9 mg/dL (ref 8.9–10.3)
Chloride: 94 mmol/L — ABNORMAL LOW (ref 98–111)
Creatinine, Ser: 1.24 mg/dL (ref 0.61–1.24)
GFR calc Af Amer: >60 mL/min (ref >60)
GFR calc non Af Amer: >60 mL/min (ref >60)
Glucose, Bld: 115 mg/dL — ABNORMAL HIGH (ref 70–99)
Potassium: 4.4 mmol/L (ref 3.5–5.1)
Sodium: 134 mmol/L — ABNORMAL LOW (ref 135–145)

## 2019-02-13 LAB — PROTIME-INR
INR: 2.7 — ABNORMAL HIGH (ref 0.8–1.2)
Prothrombin Time: 28.1 seconds — ABNORMAL HIGH (ref 11.4–15.2)

## 2019-02-13 LAB — GLUCOSE, CAPILLARY
Glucose-Capillary: 104 mg/dL — ABNORMAL HIGH (ref 70–99)
Glucose-Capillary: 108 mg/dL — ABNORMAL HIGH (ref 70–99)
Glucose-Capillary: 106 mg/dL — ABNORMAL HIGH (ref 70–99)
Glucose-Capillary: 116 mg/dL — ABNORMAL HIGH (ref 70–99)

## 2019-02-13 MED ORDER — FUROSEMIDE 20 MG PO TABS
80.0000 mg | ORAL_TABLET | Freq: Three times a day (TID) | ORAL | Status: DC
  Administered 2019-02-13 – 2019-02-14 (×2): 80 mg via ORAL
  Filled 2019-02-13 (×2): qty 4

## 2019-02-13 MED ORDER — WARFARIN SODIUM 10 MG PO TABS
10.0000 mg | ORAL_TABLET | Freq: Once | ORAL | Status: AC
  Administered 2019-02-13: 10 mg via ORAL
  Filled 2019-02-13: qty 1

## 2019-02-13 NOTE — Progress Notes (Signed)
[facility]                                                                                                                                                                         Patient Demographics  Benjamin [name], is a 53 y.o. male  GZF:[id number]  [id number]  DOB - 1965-10-04  Admit date - 02/06/2019  Admitting Physician Pearson Grippe, MD  Outpatient Primary MD for the patient is System, Pcp Not In  LOS - 7   Chief Complaint  Patient presents with  . Respiratory Distress  . Loss of Consciousness        Assessment & Plan    Patient seen briefly today due for discharge on 02/14/2019 due to room availability, plan per Discharge done yesterday by me, no further issues, Vital signs stable, patient feels fine.  Sitting up in chair and actually on room air and completely symptom-free.    Medications  Scheduled Meds: . amLODipine  10 mg Oral Daily  . atorvastatin  40 mg Oral QHS  . furosemide  80 mg Oral TID  . insulin aspart  0-5 Units Subcutaneous QHS  . insulin aspart  0-9 Units Subcutaneous TID WC  . labetalol  200 mg Oral Daily  . mouth rinse  15 mL Mouth Rinse BID  . methocarbamol  500 mg Oral TID  . pantoprazole  40 mg Oral Daily  . polyvinyl alcohol  1-2 drop Both Eyes BID  . vitamin C  500 mg Oral Daily  . Warfarin - Pharmacist Dosing Inpatient   Does not apply q1800  . zinc sulfate  220 mg Oral Daily   Continuous Infusions: PRN Meds:.hydrALAZINE    Time Spent in minutes   10 minutes   Susa Raring M.D on 02/13/2019 at 9:40 AM  Between 7am to 7pm - Pager - [phone]  After 7pm go to [email or web address] - password [email or web address]  And look for the night coverage person covering for me after hours  [facility] Office  [phone]    Subjective:   Benjamin [name] today has, No headache, No chest pain, No abdominal pain - No Nausea, No new weakness tingling or  numbness, No Cough - SOB.  Sitting up in chair symptom-free.  Objective:   Vitals:   02/12/19 2018 02/12/19 2359 02/13/19 0353 02/13/19 0547  BP: (!) 146/101 (!) 148/83 (!) 120/100 (!) 138/92  Pulse:    89  Resp:  (!) 22    Temp: 98.8 F (37.1 C) 98.2 F (36.8 C) 98.3 F (36.8 C)   TempSrc: Oral Oral Oral   SpO2:    95%  Weight:      Height:        Wt Readings from Last 3  Encounters:  02/06/19 (!) 188 kg  01/24/19 (!) 187.5 kg     Intake/Output Summary (Last 24 hours) at 02/13/2019 0940 Last data filed at 02/13/2019 0600 Gross per 24 hour  Intake 1200 ml  Output 2600 ml  Net -1400 ml    Exam Awake Alert, Oriented X 3, No new F.N deficits, Normal affect [place].AT,PERRAL Supple Neck,No JVD, No cervical lymphadenopathy appriciated.  Symmetrical Chest wall movement, Good air movement bilaterally, CTAB RRR,No Gallops,Rubs or new Murmurs, No Parasternal Heave +ve B.Sounds, Abd Soft, Non tender, No organomegaly appriciated, No rebound - guarding or rigidity. No Cyanosis, Clubbing or edema, No new Rash or bruise    Data Review

## 2019-02-13 NOTE — Progress Notes (Signed)
ANTICOAGULATION CONSULT NOTE  Pharmacy Consult for warfarin Indication: hx of PE, atrial fibrillation   No Known Allergies  Patient Measurements: Height: 5\' 11"  (180.3 cm) Weight: (!) 414 lb 7.4 oz (188 kg) IBW/kg (Calculated) : 75.3  Vital Signs: Temp: 98.3 F (36.8 C) (05/27 0800) Temp Source: Oral (05/27 0800) BP: 138/92 (05/27 0547) Pulse Rate: 89 (05/27 0800)  Labs: Recent Labs    02/11/19 0500 02/12/19 0533 02/13/19 0500  HGB 11.7*  --   --   HCT 40.4  --   --   PLT 345  --   --   LABPROT 30.3* 28.3* 28.1*  INR 3.0* 2.7* 2.7*  CREATININE 1.16 1.21 1.24    Estimated Creatinine Clearance: 117.3 mL/min (by C-G formula based on SCr of 1.24 mg/dL).  Assessment: 77 yoM with PMH of atrial fibrillation and recent acute PE who presented on 5/20 with dyspnea and syncope. INR 1.5 was subtherapeutic on admission.  Patient was previously discharged on 01/24/19 with prescriptions for lovenox 190mg  (150mg  + 40mg  syringes) and warfarin 15mg  daily and was instructed to follow-up as outpatient for INR check. Per patient, his pharmacy only had the 150mg  syringes available in stock, so he had only been using lovenox 150mg  SQ BID as bridge. He also stated that due to issues with scheduling at [facility], he was unable to be seen for follow up INR check after discharge. He does not report any missed doses of medication between admissions.   Pharmacy consulted for lovenox/warfarin dosing on admission.   INR 2.7 today. He was supposed to be dced yesterday but still here. Will give him 10mg  of coumadin today they he can resume the 15mg  daily starting tomorrow and INR Sat according to plan.    Goal of Therapy:  INR 2-3 Monitor platelets by anticoagulation protocol: Yes   Plan:   - Warfarin 10mg  PO x 1 today then resume 15mg  at discharge - INR check Sat if dc today - Monitor CBC and for s/sx of bleeding  Ulyses Southward, PharmD, BCIDP, AAHIVP, CPP Infectious Disease  Pharmacist 02/13/2019 9:50 AM

## 2019-02-13 NOTE — Plan of Care (Signed)
  Problem: Clinical Measurements: Goal: Diagnostic test results will improve Outcome: Progressing Goal: Respiratory complications will improve Outcome: Progressing Note: R/A Goal: Cardiovascular complication will be avoided Outcome: Progressing   Problem: Activity: Goal: Risk for activity intolerance will decrease Outcome: Progressing Note: ambulating in room   Problem: Nutrition: Goal: Adequate nutrition will be maintained Outcome: Progressing   Problem: Skin Integrity: Goal: Risk for impaired skin integrity will decrease Outcome: Progressing   Problem: Education: Goal: Knowledge of risk factors and measures for prevention of condition will improve Outcome: Progressing   Problem: Coping: Goal: Psychosocial and spiritual needs will be supported Outcome: Progressing   Problem: Respiratory: Goal: Will maintain a patent airway Outcome: Progressing Note: O2 sats between 94-98% R/A Goal: Complications related to the disease process, condition or treatment will be avoided or minimized Outcome: Progressing

## 2019-02-14 LAB — GLUCOSE, CAPILLARY
Glucose-Capillary: 100 mg/dL — ABNORMAL HIGH (ref 70–99)
Glucose-Capillary: 107 mg/dL — ABNORMAL HIGH (ref 70–99)

## 2019-02-14 LAB — PROTIME-INR
INR: 3 — ABNORMAL HIGH (ref 0.8–1.2)
Prothrombin Time: 30.9 seconds — ABNORMAL HIGH (ref 11.4–15.2)

## 2019-02-14 MED ORDER — WARFARIN SODIUM 2.5 MG PO TABS
2.5000 mg | ORAL_TABLET | Freq: Once | ORAL | Status: AC
  Administered 2019-02-14: 2.5 mg via ORAL
  Filled 2019-02-14: qty 1

## 2019-02-14 MED ORDER — METHOCARBAMOL 500 MG PO TABS: 500.0000 mg | ORAL_TABLET | Freq: Three times a day (TID) | ORAL | 0 refills | Status: DC | PRN

## 2019-02-14 MED ORDER — WARFARIN SODIUM 5 MG PO TABS: 5.0000 mg | ORAL_TABLET | Freq: Every day | ORAL | 0 refills | Status: DC

## 2019-02-14 NOTE — Discharge Instructions (Signed)
Follow with Primary MD in 2-3 days   Get CBC, CMP, INR -  checked  by Primary MD in 2- 3 days   Activity: As tolerated with Full fall precautions use walker/cane & assistance as needed  Disposition Home   Diet: Heart Healthy Low Carb, 0.5 L/day fluid restriction  Special Instructions: If you have smoked or chewed Tobacco  in the last 2 yrs please stop smoking, stop any regular Alcohol  and or any Recreational drug use.  On your next visit with your primary care physician please Get Medicines reviewed and adjusted.  Please request your Prim.MD to go over all Hospital Tests and Procedure/Radiological results at the follow up, please get all Hospital records sent to your Prim MD by signing hospital release before you go home.  If you experience worsening of your admission symptoms, develop shortness of breath, life threatening emergency, suicidal or homicidal thoughts you must seek medical attention immediately by calling 911 or calling your MD immediately  if symptoms less severe.  You Must read complete instructions/literature along with all the possible adverse reactions/side effects for all the Medicines you take and that have been prescribed to you. Take any new Medicines after you have completely understood and accpet all the possible adverse reactions/side effects.        Person Under Monitoring Name: Benjamin [name]  Location: [address] [place] [address]   Infection Prevention Recommendations for Individuals Confirmed to have, or Being Evaluated for, 2019 Novel Coronavirus (COVID-19) Infection Who Receive Care at Home  Individuals who are confirmed to have, or are being evaluated for, COVID-19 should follow the prevention steps below until a healthcare provider or local or state health department says they can return to normal activities.  Stay home except to get medical care You should restrict activities outside your home, except for getting medical care. Do  not go to work, school, or public areas, and do not use public transportation or taxis.  Call ahead before visiting your doctor Before your medical appointment, call the healthcare provider and tell them that you have, or are being evaluated for, COVID-19 infection. This will help the healthcare providers office take steps to keep other people from getting infected. Ask your healthcare provider to call the local or state health department.  Monitor your symptoms Seek prompt medical attention if your illness is worsening (e.g., difficulty breathing). Before going to your medical appointment, call the healthcare provider and tell them that you have, or are being evaluated for, COVID-19 infection. Ask your healthcare provider to call the local or state health department.  Wear a facemask You should wear a facemask that covers your nose and mouth when you are in the same room with other people and when you visit a healthcare provider. People who live with or visit you should also wear a facemask while they are in the same room with you.  Separate yourself from other people in your home As much as possible, you should stay in a different room from other people in your home. Also, you should use a separate bathroom, if available.  Avoid sharing household items You should not share dishes, drinking glasses, cups, eating utensils, towels, bedding, or other items with other people in your home. After using these items, you should wash them thoroughly with soap and water.  Cover your coughs and sneezes Cover your mouth and nose with a tissue when you cough or sneeze, or you can cough or sneeze into your  sleeve. Throw used tissues in a lined trash can, and immediately wash your hands with soap and water for at least 20 seconds or use an alcohol-based hand rub.  Wash your [facility] your hands often and thoroughly with soap and water for at least 20 seconds. You can use an alcohol-based  hand sanitizer if soap and water are not available and if your hands are not visibly dirty. Avoid touching your eyes, nose, and mouth with unwashed hands.   Prevention Steps for Caregivers and Household Members of Individuals Confirmed to have, or Being Evaluated for, COVID-19 Infection Being Cared for in the Home  If you live with, or provide care at home for, a person confirmed to have, or being evaluated for, COVID-19 infection please follow these guidelines to prevent infection:  Follow healthcare providers instructions Make sure that you understand and can help the patient follow any healthcare provider instructions for all care.  Provide for the patients basic needs You should help the patient with basic needs in the home and provide support for getting groceries, prescriptions, and other personal needs.  Monitor the patients symptoms If they are getting sicker, call his or her medical provider and tell them that the patient has, or is being evaluated for, COVID-19 infection. This will help the healthcare providers office take steps to keep other people from getting infected. Ask the healthcare provider to call the local or state health department.  Limit the number of people who have contact with the patient  If possible, have only one caregiver for the patient.  Other household members should stay in another home or place of residence. If this is not possible, they should stay  in another room, or be separated from the patient as much as possible. Use a separate bathroom, if available.  Restrict visitors who do not have an essential need to be in the home.  Keep older adults, very young children, and other sick people away from the patient Keep older adults, very young children, and those who have compromised immune systems or chronic health conditions away from the patient. This includes people with chronic heart, lung, or kidney conditions, diabetes, and  cancer.  Ensure good ventilation Make sure that shared spaces in the home have good air flow, such as from an air conditioner or an opened window, weather permitting.  Wash your hands often  Wash your hands often and thoroughly with soap and water for at least 20 seconds. You can use an alcohol based hand sanitizer if soap and water are not available and if your hands are not visibly dirty.  Avoid touching your eyes, nose, and mouth with unwashed hands.  Use disposable paper towels to dry your hands. If not available, use dedicated cloth towels and replace them when they become wet.  Wear a facemask and gloves  Wear a disposable facemask at all times in the room and gloves when you touch or have contact with the patients blood, body fluids, and/or secretions or excretions, such as sweat, saliva, sputum, nasal mucus, vomit, urine, or feces.  Ensure the mask fits over your nose and mouth tightly, and do not touch it during use.  Throw out disposable facemasks and gloves after using them. Do not reuse.  Wash your hands immediately after removing your facemask and gloves.  If your personal clothing becomes contaminated, carefully remove clothing and launder. Wash your hands after handling contaminated clothing.  Place all used disposable facemasks, gloves, and other waste in a lined  container before disposing them with other household waste.  Remove gloves and wash your hands immediately after handling these items.  Do not share dishes, glasses, or other household items with the patient  Avoid sharing household items. You should not share dishes, drinking glasses, cups, eating utensils, towels, bedding, or other items with a patient who is confirmed to have, or being evaluated for, COVID-19 infection.  After the person uses these items, you should wash them thoroughly with soap and water.  Wash laundry thoroughly  Immediately remove and wash clothes or bedding that have blood, body  fluids, and/or secretions or excretions, such as sweat, saliva, sputum, nasal mucus, vomit, urine, or feces, on them.  Wear gloves when handling laundry from the patient.  Read and follow directions on labels of laundry or clothing items and detergent. In general, wash and dry with the warmest temperatures recommended on the label.  Clean all areas the individual has used often  Clean all touchable surfaces, such as counters, tabletops, doorknobs, bathroom fixtures, toilets, phones, keyboards, tablets, and bedside tables, every day. Also, clean any surfaces that may have blood, body fluids, and/or secretions or excretions on them.  Wear gloves when cleaning surfaces the patient has come in contact with.  Use a diluted bleach solution (e.g., dilute bleach with 1 part bleach and 10 parts water) or a household disinfectant with a label that says EPA-registered for coronaviruses. To make a bleach solution at home, add 1 tablespoon of bleach to 1 quart (4 cups) of water. For a larger supply, add  cup of bleach to 1 gallon (16 cups) of water.  Read labels of cleaning products and follow recommendations provided on product labels. Labels contain instructions for safe and effective use of the cleaning product including precautions you should take when applying the product, such as wearing gloves or eye protection and making sure you have good ventilation during use of the product.  Remove gloves and wash hands immediately after cleaning.  Monitor yourself for signs and symptoms of illness Caregivers and household members are considered close contacts, should monitor their health, and will be asked to limit movement outside of the home to the extent possible. Follow the monitoring steps for close contacts listed on the symptom monitoring form.   ? If you have additional questions, contact your local health department or call the epidemiologist on call at [phone] (available 24/7). ? This  guidance is subject to change. For the most up-to-date guidance from [facility], please refer to their website: TripMetro.hu  Information on my medicine - Coumadin   (Warfarin)  This medication education was reviewed with me or my healthcare representative as part of my discharge preparation.  The pharmacist that spoke with me during my hospital stay was:  Ulyses Southward, RPH-CPP  Why was Coumadin prescribed for you? Coumadin was prescribed for you because you have a blood clot or a medical condition that can cause an increased risk of forming blood clots. Blood clots can cause serious health problems by blocking the flow of blood to the heart, lung, or brain. Coumadin can prevent harmful blood clots from forming. As a reminder your indication for Coumadin is:   Blood Clotting Disorder  What test will check on my response to Coumadin? While on Coumadin (warfarin) you will need to have an INR test regularly to ensure that your dose is keeping you in the desired range. The INR (international normalized ratio) number is calculated from the result of the laboratory test called prothrombin  time (PT).  If an INR APPOINTMENT HAS NOT ALREADY BEEN MADE FOR YOU please schedule an appointment to have this lab work done by your health care provider within 7 days. Your INR goal is usually a number between:  2 to 3 or your provider may give you a more narrow range like 2-2.5.  Ask your health care provider during an office visit what your goal INR is.  What  do you need to  know  About  COUMADIN? Take Coumadin (warfarin) exactly as prescribed by your healthcare provider about the same time each day.  DO NOT stop taking without talking to the doctor who prescribed the medication.  Stopping without other blood clot prevention medication to take the place of Coumadin may increase your risk of developing a new clot or stroke.  Get refills before you run out.  What  do you do if you miss a dose? If you miss a dose, take it as soon as you remember on the same day then continue your regularly scheduled regimen the next day.  Do not take two doses of Coumadin at the same time.  Important Safety Information A possible side effect of Coumadin (Warfarin) is an increased risk of bleeding. You should call your healthcare provider right away if you experience any of the following: ? Bleeding from an injury or your nose that does not stop. ? Unusual colored urine (red or dark brown) or unusual colored stools (red or black). ? Unusual bruising for unknown reasons. ? A serious fall or if you hit your head (even if there is no bleeding).  Some foods or medicines interact with Coumadin (warfarin) and might alter your response to warfarin. To help avoid this: ? Eat a balanced diet, maintaining a consistent amount of Vitamin K. ? Notify your provider about major diet changes you plan to make. ? Avoid alcohol or limit your intake to 1 drink for women and 2 drinks for men per day. (1 drink is 5 oz. wine, 12 oz. beer, or 1.5 oz. liquor.)  Make sure that ANY health care provider who prescribes medication for you knows that you are taking Coumadin (warfarin).  Also make sure the healthcare provider who is monitoring your Coumadin knows when you have started a new medication including herbals and non-prescription products.  Coumadin (Warfarin)  Major Drug Interactions  Increased Warfarin Effect Decreased Warfarin Effect  Alcohol (large quantities) Antibiotics (esp. Septra/Bactrim, Flagyl, Cipro) Amiodarone (Cordarone) Aspirin (ASA) Cimetidine (Tagamet) Megestrol (Megace) NSAIDs (ibuprofen, naproxen, etc.) Piroxicam (Feldene) Propafenone (Rythmol SR) Propranolol (Inderal) Isoniazid (INH) Posaconazole (Noxafil) Barbiturates (Phenobarbital) Carbamazepine (Tegretol) Chlordiazepoxide (Librium) Cholestyramine (Questran) Griseofulvin Oral  Contraceptives Rifampin Sucralfate (Carafate) Vitamin K   Coumadin (Warfarin) Major Herbal Interactions  Increased Warfarin Effect Decreased Warfarin Effect  Garlic Ginseng Ginkgo biloba Coenzyme Q10 Green tea St. Johns wort    Coumadin (Warfarin) FOOD Interactions  Eat a consistent number of servings per week of foods HIGH in Vitamin K (1 serving =  cup)  Collards (cooked, or boiled & drained) Kale (cooked, or boiled & drained) Mustard greens (cooked, or boiled & drained) Parsley *serving size only =  cup Spinach (cooked, or boiled & drained) Swiss chard (cooked, or boiled & drained) Turnip greens (cooked, or boiled & drained)  Eat a consistent number of servings per week of foods MEDIUM-HIGH in Vitamin K (1 serving = 1 cup)  Asparagus (cooked, or boiled & drained) Broccoli (cooked, boiled & drained, or raw & chopped) Brussel sprouts (cooked, or boiled &  boiled & drained, or raw & chopped) °Brussel sprouts (cooked, or boiled & drained) *serving size only = ½ cup °Lettuce, raw (green leaf, endive, romaine) °Spinach, raw °Turnip greens, raw & chopped  ° °These websites have more information on Coumadin (warfarin):  www.coumadin.com; °www.ahrq.gov/consumer/coumadin.htm; ° ° °

## 2019-02-14 NOTE — Progress Notes (Addendum)
ANTICOAGULATION CONSULT NOTE  Pharmacy Consult for warfarin Indication: hx of PE, atrial fibrillation   No Known Allergies  Patient Measurements: Height: 5\' 11"  (180.3 cm) Weight: (!) 414 lb 7.4 oz (188 kg) IBW/kg (Calculated) : 75.3  Vital Signs: Temp: 97.7 F (36.5 C) (05/28 0731) Temp Source: Oral (05/28 0731) BP: 127/82 (05/28 0731) Pulse Rate: 91 (05/28 0731)  Labs: Recent Labs    02/12/19 0533 02/13/19 0500 02/14/19 0400  LABPROT 28.3* 28.1* 30.9*  INR 2.7* 2.7* 3.0*  CREATININE 1.21 1.24  --     Estimated Creatinine Clearance: 117.3 mL/min (by C-G formula based on SCr of 1.24 mg/dL).  Assessment: 51 yoM with PMH of atrial fibrillation and recent acute PE who presented on 5/20 with dyspnea and syncope. INR 1.5 was subtherapeutic on admission.  Patient was previously discharged on 01/24/19 with prescriptions for lovenox 190mg  (150mg  + 40mg  syringes) and warfarin 15mg  daily and was instructed to follow-up as outpatient for INR check. Per patient, his pharmacy only had the 150mg  syringes available in stock, so he had only been using lovenox 150mg  SQ BID as bridge. He also stated that due to issues with scheduling at [facility], he was unable to be seen for follow up INR check after discharge. He does not report any missed doses of medication between admissions.   Pharmacy consulted for lovenox/warfarin dosing on admission.   INR increase to 3 today. He is planned to be dced today depending on bed availability. We will give a small dose today.  Will resume his 15mg  daily starting tomorrow and INR Sat according to plan.    Goal of Therapy:  INR 2-3 Monitor platelets by anticoagulation protocol: Yes   Plan:   - Warfarin 2.5mg  PO x 1 today then 5mg  tomrrow (spoke with Dr Thedore Mins) - INR check Sat if dc today - Monitor CBC and for s/sx of bleeding  Ulyses Southward, PharmD, BCIDP, AAHIVP, CPP Infectious Disease Pharmacist 02/14/2019 8:21 AM

## 2019-02-14 NOTE — Progress Notes (Signed)
Pt has slept sitting up in recliner with feet on the floor almost all night, does not want to be bothered

## 2019-02-14 NOTE — TOC Progression Note (Signed)
Transition of Care [facility]) - Progression Note    Patient Details  Name: Benjamin [name] MRN: [id number] Date of Birth: 09-Oct-1965  [facility] CM/SW Contact  Colleen Can RN, BSN, NCM-BC, [place] [phone] Phone Number: 02/14/2019, 2:15 PM  Clinical Narrative:    Message left for Quinn Plowman at [phone], [facility], where pt resides. Awaiting a return call to ensure patient is able to safely quarantine and discharge back to the facility today.    Expected Discharge Plan: Home/Self Care([facility]) Barriers to Discharge: Barriers Unresolved (comment)(COVID Positive - inability to self isolate)  Expected Discharge Plan and Services Expected Discharge Plan: Home/Self Care([facility]) In-house Referral: NA Discharge Planning Services: CM Consult Post Acute Care Choice: NA Living arrangements for the past 2 months: Boarding House Expected Discharge Date: 02/14/19                   Social Determinants of Health (SDOH) Interventions    Readmission Risk Interventions No flowsheet data found.

## 2019-02-18 ENCOUNTER — Other Ambulatory Visit: Payer: Self-pay

## 2019-02-18 ENCOUNTER — Encounter: Payer: Self-pay | Admitting: Nurse Practitioner

## 2019-02-18 ENCOUNTER — Ambulatory Visit: Payer: HRSA Program | Attending: Nurse Practitioner | Admitting: Nurse Practitioner

## 2019-02-18 DIAGNOSIS — J988 Other specified respiratory disorders: Secondary | ICD-10-CM | POA: Diagnosis not present

## 2019-02-18 DIAGNOSIS — I480 Paroxysmal atrial fibrillation: Secondary | ICD-10-CM

## 2019-02-18 DIAGNOSIS — I5033 Acute on chronic diastolic (congestive) heart failure: Secondary | ICD-10-CM | POA: Diagnosis not present

## 2019-02-18 DIAGNOSIS — U071 COVID-19: Secondary | ICD-10-CM

## 2019-02-18 DIAGNOSIS — E875 Hyperkalemia: Secondary | ICD-10-CM

## 2019-02-18 DIAGNOSIS — I2699 Other pulmonary embolism without acute cor pulmonale: Secondary | ICD-10-CM | POA: Diagnosis not present

## 2019-02-18 MED ORDER — RIVAROXABAN 20 MG PO TABS: 20.0000 mg | ORAL_TABLET | Freq: Every day | ORAL | 0 refills | Status: DC

## 2019-02-18 MED ORDER — SODIUM POLYSTYRENE SULFONATE 15 GM/60ML PO SUSP: 15.0000 g | Freq: Once | ORAL | 0 refills | Status: AC

## 2019-02-18 NOTE — Progress Notes (Signed)
Virtual Visit via Telephone Note Due to national recommendations of social distancing due to COVID 19, telehealth visit is felt to be most appropriate for this patient at this time.  I discussed the limitations, risks, security and privacy concerns of performing an evaluation and management service by telephone and the availability of in person appointments. I also discussed with the patient that there may be a patient responsible charge related to this service. The patient expressed understanding and agreed to proceed.    I connected with Benjamin [name] on 02/20/19  at   2:30 PM EDT  EDT by telephone and verified that I am speaking with the correct person using two identifiers.   Consent I discussed the limitations, risks, security and privacy concerns of performing an evaluation and management service by telephone and the availability of in person appointments. I also discussed with the patient that there may be a patient responsible charge related to this service. The patient expressed understanding and agreed to proceed.   Location of Patient: Private Residence   Location of Provider: [facility]    Persons participating in Telemedicine visit: [name] FNP-BC YY [place] CMA MENG MENDIZABAL    History of Present Illness: Telemedicine visit for: Establish Care  Admitted to the hospital on 02-06-19 and discharged on 02-14-2019. States he is currently staying in a half way house after being  recently released from prison.  Has a past medical history of paroxysmal A-fib (HCC), Diabetes mellitus without complication (HCC), Hypertension, Obesity, Pulmonary embolism (HCC), and Sleep apnea on CPAP. While being hospitalized he was also diagnosed with COVID-19 on 02-06-19. He has been self isolating per his report (not sure if this is totally possible in half way house).  Currently on coumadin. INR 02-14-2019 3.0. Will transition to xarelto as he is covid  positive and obtaining INRs for coumadin may be somewhat difficult. He has been instructed today to continue coumadin until he obtains his xarelto. At that time he will stop coumadin one day prior to starting xarelto. He verbalized understanding.  We should be able to get him into the office at the end of the month based on COVID guidelines/safety. Ultimately will need referral to Cardiology (ECHO) and Pulmonology as he is also on nasal O2 at night due to nocturnal hypoxia. He will need a CPAP as well as he has history of OSA.  Patient has been advised to apply for financial assistance and schedule to see our financial counselor.    Past Medical History:  Diagnosis Date  . A-fib (HCC)   . Diabetes mellitus without complication (HCC)   . Hypertension   . Obesity   . Pulmonary embolism (HCC)   . Sleep apnea     Past Surgical History:  Procedure Laterality Date  . NO PAST SURGERIES      Family History  Problem Relation Age of Onset  . Hypertension Mother   . Diabetes Mother     Social History   Socioeconomic History  . Marital status: Married    Spouse name: Not on file  . Number of children: Not on file  . Years of education: Not on file  . Highest education level: Not on file  Occupational History  . Not on file  Social Needs  . Financial resource strain: Not on file  . Food insecurity:    Worry: Not on file    Inability: Not on file  . Transportation needs:    Medical: Not on  file    Non-medical: Not on file  Tobacco Use  . Smoking status: Former Games developer  . Smokeless tobacco: Never Used  Substance and Sexual Activity  . Alcohol use: Not Currently  . Drug use: Not Currently  . Sexual activity: Not Currently  Lifestyle  . Physical activity:    Days per week: Not on file    Minutes per session: Not on file  . Stress: Not on file  Relationships  . Social connections:    Talks on phone: Not on file    Gets together: Not on file    Attends religious service: Not on  file    Active member of club or organization: Not on file    Attends meetings of clubs or organizations: Not on file    Relationship status: Not on file  Other Topics Concern  . Not on file  Social History Narrative  . Not on file     Observations/Objective: Awake, alert and oriented x 3   Review of Systems  Constitutional: Negative for fever, malaise/fatigue and weight loss.  HENT: Negative.  Negative for nosebleeds.   Eyes: Negative.  Negative for blurred vision, double vision and photophobia.  Respiratory: Positive for cough and shortness of breath. Negative for hemoptysis.   Cardiovascular: Positive for leg swelling. Negative for chest pain and palpitations.  Gastrointestinal: Positive for heartburn. Negative for blood in stool, nausea and vomiting.  Musculoskeletal: Negative.  Negative for myalgias.  Neurological: Positive for headaches. Negative for dizziness, focal weakness and seizures.  Psychiatric/Behavioral: Negative.  Negative for suicidal ideas.    Assessment and Plan: Roderrick was evaluated today for hospitalization follow-up.  Diagnoses and all orders for this visit:  COVID-19 Continues self isolation at halfway house.  No direct/face to face contact with other residents or staff Monitor temperature 2 times per day Avoid ibuprofen!!!!   Acute pulmonary embolism without acute cor pulmonale, unspecified pulmonary embolism type (HCC) -     rivaroxaban (XARELTO) 20 MG TABS tablet; Take 1 tablet (20 mg total) by mouth daily with supper. Skip one dose of coumadin and start xarelto on the following day.  PAF (paroxysmal atrial fibrillation) (HCC) -     rivaroxaban (XARELTO) 20 MG TABS tablet; Take 1 tablet (20 mg total) by mouth daily with supper. Skip one dose of coumadin and start xarelto on the following day.  Acute on chronic diastolic CHF (congestive heart failure) (HCC) Will need cardiology referral.  Continue on amlodipine 10 mg, furosemide 80 mg 3 times  daily, lisinopril 40 mg daily, spironolactone 50 mg daily, labetalol 200 mg daily.  Obesity, Class III, BMI 40-49.9 (morbid obesity) (HCC)  DASH DIET  Hyperkalemia -     sodium polystyrene (KAYEXALATE) 15 GM/60ML suspension; Take 60 mLs (15 g total) by mouth once for 1 dose.     Follow Up Instructions Return in about 4 weeks (around 03/18/2019).     I discussed the assessment and treatment plan with the patient. The patient was provided an opportunity to ask questions and all were answered. The patient agreed with the plan and demonstrated an understanding of the instructions.   The patient was advised to call back or seek an in-person evaluation if the symptoms worsen or if the condition fails to improve as anticipated.  I provided 24 minutes of non-face-to-face time during this encounter including median intraservice time, reviewing previous notes, labs, imaging, medications and explaining diagnosis and management.  Claiborne Rigg, FNP-BC

## 2019-02-19 MED FILL — XARELTO 20 MG TABLET: 20 | 30 days supply | Qty: 30 | Fill #0

## 2019-02-20 ENCOUNTER — Encounter (HOSPITAL_COMMUNITY): Payer: Self-pay

## 2019-02-20 ENCOUNTER — Inpatient Hospital Stay (HOSPITAL_COMMUNITY)
Admission: EM | Admit: 2019-02-20 | Discharge: 2019-03-01 | DRG: 378 | Disposition: A | Discharge status: Home / Self Care | Payer: Medicaid Other | Attending: Internal Medicine | Admitting: Internal Medicine

## 2019-02-20 ENCOUNTER — Other Ambulatory Visit: Payer: Self-pay

## 2019-02-20 ENCOUNTER — Encounter: Payer: Self-pay | Admitting: Nurse Practitioner

## 2019-02-20 DIAGNOSIS — Z7984 Long term (current) use of oral hypoglycemic drugs: Secondary | ICD-10-CM

## 2019-02-20 DIAGNOSIS — E119 Type 2 diabetes mellitus without complications: Secondary | ICD-10-CM | POA: Diagnosis present

## 2019-02-20 DIAGNOSIS — Z87891 Personal history of nicotine dependence: Secondary | ICD-10-CM

## 2019-02-20 DIAGNOSIS — K625 Hemorrhage of anus and rectum: Secondary | ICD-10-CM | POA: Diagnosis present

## 2019-02-20 DIAGNOSIS — K921 Melena: Principal | ICD-10-CM | POA: Diagnosis present

## 2019-02-20 DIAGNOSIS — R5383 Other fatigue: Secondary | ICD-10-CM

## 2019-02-20 DIAGNOSIS — E1169 Type 2 diabetes mellitus with other specified complication: Secondary | ICD-10-CM | POA: Diagnosis present

## 2019-02-20 DIAGNOSIS — Z9981 Dependence on supplemental oxygen: Secondary | ICD-10-CM

## 2019-02-20 DIAGNOSIS — I2699 Other pulmonary embolism without acute cor pulmonale: Secondary | ICD-10-CM | POA: Diagnosis present

## 2019-02-20 DIAGNOSIS — Z8249 Family history of ischemic heart disease and other diseases of the circulatory system: Secondary | ICD-10-CM

## 2019-02-20 DIAGNOSIS — Z20828 Contact with and (suspected) exposure to other viral communicable diseases: Secondary | ICD-10-CM | POA: Diagnosis present

## 2019-02-20 DIAGNOSIS — Z7901 Long term (current) use of anticoagulants: Secondary | ICD-10-CM

## 2019-02-20 DIAGNOSIS — I5032 Chronic diastolic (congestive) heart failure: Secondary | ICD-10-CM | POA: Diagnosis present

## 2019-02-20 DIAGNOSIS — Z8619 Personal history of other infectious and parasitic diseases: Secondary | ICD-10-CM

## 2019-02-20 DIAGNOSIS — I480 Paroxysmal atrial fibrillation: Secondary | ICD-10-CM | POA: Diagnosis present

## 2019-02-20 DIAGNOSIS — N179 Acute kidney failure, unspecified: Secondary | ICD-10-CM | POA: Diagnosis not present

## 2019-02-20 DIAGNOSIS — D62 Acute posthemorrhagic anemia: Secondary | ICD-10-CM | POA: Diagnosis present

## 2019-02-20 DIAGNOSIS — R791 Abnormal coagulation profile: Secondary | ICD-10-CM | POA: Diagnosis present

## 2019-02-20 DIAGNOSIS — K922 Gastrointestinal hemorrhage, unspecified: Secondary | ICD-10-CM | POA: Diagnosis not present

## 2019-02-20 DIAGNOSIS — K644 Residual hemorrhoidal skin tags: Secondary | ICD-10-CM | POA: Diagnosis present

## 2019-02-20 DIAGNOSIS — E669 Obesity, unspecified: Secondary | ICD-10-CM

## 2019-02-20 DIAGNOSIS — Z79899 Other long term (current) drug therapy: Secondary | ICD-10-CM

## 2019-02-20 DIAGNOSIS — Z6841 Body Mass Index (BMI) 40.0 and over, adult: Secondary | ICD-10-CM

## 2019-02-20 DIAGNOSIS — Z86711 Personal history of pulmonary embolism: Secondary | ICD-10-CM

## 2019-02-20 DIAGNOSIS — R1031 Right lower quadrant pain: Secondary | ICD-10-CM | POA: Diagnosis not present

## 2019-02-20 DIAGNOSIS — E662 Morbid (severe) obesity with alveolar hypoventilation: Secondary | ICD-10-CM | POA: Diagnosis present

## 2019-02-20 DIAGNOSIS — I110 Hypertensive heart disease with heart failure: Secondary | ICD-10-CM | POA: Diagnosis present

## 2019-02-20 DIAGNOSIS — Z5181 Encounter for therapeutic drug level monitoring: Secondary | ICD-10-CM

## 2019-02-20 DIAGNOSIS — K648 Other hemorrhoids: Secondary | ICD-10-CM | POA: Diagnosis present

## 2019-02-20 DIAGNOSIS — R222 Localized swelling, mass and lump, trunk: Secondary | ICD-10-CM | POA: Diagnosis not present

## 2019-02-20 LAB — COMPREHENSIVE METABOLIC PANEL
ALT: 24 U/L (ref 0–44)
AST: 20 U/L (ref 15–41)
Albumin: 3.5 g/dL (ref 3.5–5.0)
Alkaline Phosphatase: 42 U/L (ref 38–126)
Anion gap: 7 (ref 5–15)
BUN: 9 mg/dL (ref 6–20)
CO2: 30 mmol/L (ref 22–32)
Calcium: 9.1 mg/dL (ref 8.9–10.3)
Chloride: 101 mmol/L (ref 98–111)
Creatinine, Ser: 1.07 mg/dL (ref 0.61–1.24)
GFR calc Af Amer: >60 mL/min (ref >60)
GFR calc non Af Amer: >60 mL/min (ref >60)
Glucose, Bld: 77 mg/dL (ref 70–99)
Potassium: 4.2 mmol/L (ref 3.5–5.1)
Sodium: 138 mmol/L (ref 135–145)
Total Bilirubin: 0.5 mg/dL (ref 0.3–1.2)
Total Protein: 7.4 g/dL (ref 6.5–8.1)

## 2019-02-20 LAB — CBC WITH DIFFERENTIAL/PLATELET
Abs Immature Granulocytes: 0.03 10*3/uL (ref 0.00–0.07)
Basophils Absolute: 0 10*3/uL (ref 0.0–0.1)
Basophils Relative: 0 %
Eosinophils Absolute: 0.5 10*3/uL (ref 0.0–0.5)
Eosinophils Relative: 6 %
HCT: 39.4 % (ref 39.0–52.0)
Hemoglobin: 11.6 g/dL — ABNORMAL LOW (ref 13.0–17.0)
Immature Granulocytes: 0 %
Lymphocytes Relative: 35 %
Lymphs Abs: 2.8 10*3/uL (ref 0.7–4.0)
MCH: 23.7 pg — ABNORMAL LOW (ref 26.0–34.0)
MCHC: 29.4 g/dL — ABNORMAL LOW (ref 30.0–36.0)
MCV: 80.4 fL (ref 80.0–100.0)
Monocytes Absolute: 0.9 10*3/uL (ref 0.1–1.0)
Monocytes Relative: 11 %
Neutro Abs: 3.8 10*3/uL (ref 1.7–7.7)
Neutrophils Relative %: 48 %
Platelets: 413 10*3/uL — ABNORMAL HIGH (ref 150–400)
RBC: 4.9 MIL/uL (ref 4.22–5.81)
RDW: 15.3 % (ref 11.5–15.5)
WBC: 7.9 10*3/uL (ref 4.0–10.5)
nRBC: 0 % (ref 0.0–0.2)

## 2019-02-20 LAB — LIPASE, BLOOD: Lipase: 23 U/L (ref 11–51)

## 2019-02-20 LAB — POC OCCULT BLOOD, ED: Fecal Occult Bld: POSITIVE — AB

## 2019-02-20 LAB — SARS CORONAVIRUS 2: SARS Coronavirus 2: NOT DETECTED

## 2019-02-20 LAB — APTT: aPTT: 37 seconds — ABNORMAL HIGH (ref 24–36)

## 2019-02-20 LAB — PROTIME-INR
INR: 1.9 — ABNORMAL HIGH (ref 0.8–1.2)
Prothrombin Time: 21.3 seconds — ABNORMAL HIGH (ref 11.4–15.2)

## 2019-02-20 LAB — GLUCOSE, CAPILLARY: Glucose-Capillary: 110 mg/dL — ABNORMAL HIGH (ref 70–99)

## 2019-02-20 LAB — HEMOGLOBIN AND HEMATOCRIT, BLOOD
HCT: 40.1 % (ref 39.0–52.0)
Hemoglobin: 11.9 g/dL — ABNORMAL LOW (ref 13.0–17.0)

## 2019-02-20 MED ORDER — ONDANSETRON HCL 4 MG/2ML IJ SOLN: 4.0000 mg | Freq: Four times a day (QID) | INTRAMUSCULAR | Status: DC | PRN

## 2019-02-20 MED ORDER — LABETALOL HCL 100 MG PO TABS
200.0000 mg | ORAL_TABLET | Freq: Every day | ORAL | Status: DC
  Administered 2019-02-21 – 2019-03-01 (×9): 200 mg via ORAL
  Filled 2019-02-20 (×2): qty 1
  Filled 2019-02-20 (×3): qty 2
  Filled 2019-02-20: qty 1
  Filled 2019-02-20 (×4): qty 2
  Filled 2019-02-20 (×2): qty 1
  Filled 2019-02-20: qty 2
  Filled 2019-02-20: qty 1

## 2019-02-20 MED ORDER — ATORVASTATIN CALCIUM 40 MG PO TABS
40.0000 mg | ORAL_TABLET | Freq: Every day | ORAL | Status: DC
  Administered 2019-02-20 – 2019-02-28 (×9): 40 mg via ORAL
  Filled 2019-02-20 (×9): qty 1

## 2019-02-20 MED ORDER — INSULIN ASPART 100 UNIT/ML ~~LOC~~ SOLN: 0.0000 [IU] | Freq: Every day | SUBCUTANEOUS | Status: DC

## 2019-02-20 MED ORDER — ALBUTEROL SULFATE (2.5 MG/3ML) 0.083% IN NEBU: 2.5000 mg | INHALATION_SOLUTION | RESPIRATORY_TRACT | Status: DC | PRN

## 2019-02-20 MED ORDER — PANTOPRAZOLE SODIUM 40 MG PO TBEC
40.0000 mg | DELAYED_RELEASE_TABLET | Freq: Every day | ORAL | Status: DC
  Administered 2019-02-20 – 2019-03-01 (×10): 40 mg via ORAL
  Filled 2019-02-20 (×10): qty 1

## 2019-02-20 MED ORDER — POLYVINYL ALCOHOL 1.4 % OP SOLN
1.0000 [drp] | Freq: Every day | OPHTHALMIC | Status: DC | PRN
  Administered 2019-02-21 – 2019-02-23 (×3): 1 [drp] via OPHTHALMIC
  Filled 2019-02-20 (×2): qty 15

## 2019-02-20 MED ORDER — INSULIN ASPART 100 UNIT/ML ~~LOC~~ SOLN
0.0000 [IU] | Freq: Three times a day (TID) | SUBCUTANEOUS | Status: DC
  Administered 2019-02-22 – 2019-02-26 (×7): 2 [IU] via SUBCUTANEOUS
  Administered 2019-02-26 (×2): 3 [IU] via SUBCUTANEOUS
  Administered 2019-02-27 – 2019-03-01 (×3): 2 [IU] via SUBCUTANEOUS

## 2019-02-20 MED ORDER — ACETAMINOPHEN 650 MG RE SUPP: 650.0000 mg | Freq: Four times a day (QID) | RECTAL | Status: DC | PRN

## 2019-02-20 MED ORDER — CARBOXYMETHYLCELLULOSE SODIUM 0.5 % OP SOLN: 1.0000 [drp] | Freq: Every day | OPHTHALMIC | Status: DC | PRN

## 2019-02-20 MED ORDER — ENOXAPARIN SODIUM 300 MG/3ML IJ SOLN
1.0000 mg/kg | Freq: Two times a day (BID) | INTRAMUSCULAR | Status: DC
  Administered 2019-02-21: 01:00:00 179 mg via SUBCUTANEOUS
  Filled 2019-02-20 (×3): qty 1.79

## 2019-02-20 MED ORDER — AMLODIPINE BESYLATE 10 MG PO TABS
10.0000 mg | ORAL_TABLET | Freq: Every day | ORAL | Status: DC
  Administered 2019-02-20 – 2019-03-01 (×10): 10 mg via ORAL
  Filled 2019-02-20 (×2): qty 1
  Filled 2019-02-20: qty 2
  Filled 2019-02-20 (×7): qty 1

## 2019-02-20 MED ORDER — LISINOPRIL 40 MG PO TABS
40.0000 mg | ORAL_TABLET | Freq: Every day | ORAL | Status: DC
  Administered 2019-02-20 – 2019-03-01 (×10): 40 mg via ORAL
  Filled 2019-02-20: qty 1
  Filled 2019-02-20: qty 2
  Filled 2019-02-20 (×8): qty 1

## 2019-02-20 MED ORDER — ACETAMINOPHEN 325 MG PO TABS: 650.0000 mg | ORAL_TABLET | Freq: Four times a day (QID) | ORAL | Status: DC | PRN

## 2019-02-20 MED ORDER — SPIRONOLACTONE 25 MG PO TABS
50.0000 mg | ORAL_TABLET | Freq: Every day | ORAL | Status: DC
  Administered 2019-02-21 – 2019-03-01 (×9): 50 mg via ORAL
  Filled 2019-02-20 (×3): qty 2
  Filled 2019-02-20: qty 1
  Filled 2019-02-20 (×2): qty 2
  Filled 2019-02-20: qty 1
  Filled 2019-02-20: qty 2
  Filled 2019-02-20 (×3): qty 1
  Filled 2019-02-20: qty 2
  Filled 2019-02-20: qty 1
  Filled 2019-02-20: qty 2

## 2019-02-20 MED ORDER — ONDANSETRON HCL 4 MG PO TABS: 4.0000 mg | ORAL_TABLET | Freq: Four times a day (QID) | ORAL | Status: DC | PRN

## 2019-02-20 MED ORDER — TRIAMCINOLONE ACETONIDE 0.1 % EX CREA
1.0000 "application " | TOPICAL_CREAM | Freq: Three times a day (TID) | CUTANEOUS | Status: DC
  Administered 2019-02-20 – 2019-03-01 (×13): 1 via TOPICAL
  Filled 2019-02-20 (×3): qty 15

## 2019-02-20 MED ORDER — FUROSEMIDE 80 MG PO TABS
80.0000 mg | ORAL_TABLET | Freq: Three times a day (TID) | ORAL | Status: DC
  Administered 2019-02-20 – 2019-03-01 (×26): 80 mg via ORAL
  Filled 2019-02-20 (×26): qty 1

## 2019-02-20 NOTE — Progress Notes (Signed)
Received call from patient at 11:20am today. He states he experienced abdominal pain on Monday. Took several TUMS. Started noticing onset of BRBPR and blood clots in toilet water. He is on coumadin with last reported INR 3.0. He was to start xarelto today after missing his dose of coumadin yesterday. States bleeding has been ongoing since Monday night. Instructed to call 911 and let them know he is covid positive and needs to be taken to the emergency room.

## 2019-02-20 NOTE — Progress Notes (Signed)
ANTICOAGULATION CONSULT NOTE - Initial Consult  Pharmacy Consult for lovenox Indication: atrial fibrillation and pulmonary embolus  No Known Allergies  Patient Measurements: Height: 5\' 11"  (180.3 cm) Weight: (!) 395 lb (179.2 kg) IBW/kg (Calculated) : 75.3  Vital Signs: Temp: 98.6 F (37 C) (06/03 1924) Temp Source: Oral (06/03 1924) BP: 183/112 (06/03 1924) Pulse Rate: 86 (06/03 1924)  Labs: Recent Labs    02/20/19 1226 02/20/19 1801  HGB 11.6* 11.9*  HCT 39.4 40.1  PLT 413*  --   APTT 37*  --   LABPROT 21.3*  --   INR 1.9*  --   CREATININE 1.07  --     Estimated Creatinine Clearance: 132 mL/min (by C-G formula based on SCr of 1.07 mg/dL).   Medical History: Past Medical History:  Diagnosis Date  . A-fib (HCC)   . Diabetes mellitus without complication (HCC)   . Hypertension   . Obesity   . Pulmonary embolism (HCC)   . Sleep apnea    Assessment: 53 year old male with PAF and recent PE on 5/4 on coumadin prior to admit. INR 1.9 on admission. Patient reporting blood in stool.   There was discussion of switching to DOAC recently but he just received Rx in the mail today. Orders to change to lovenox shots while GIB workup is done.   Goal of Therapy:  INR 2-3 Anti-Xa level 0.6-1 units/ml 4hrs after LMWH dose given Monitor platelets by anticoagulation protocol: Yes   Plan:  Lovenox 1mg /kg q 12 hours CBC daily with possible GIB  Sheppard Coil PharmD., BCPS Clinical Pharmacist 02/20/2019 10:04 PM

## 2019-02-20 NOTE — Progress Notes (Signed)
Patient refused CPAP for the night  

## 2019-02-20 NOTE — ED Provider Notes (Signed)
[facility] EMERGENCY DEPARTMENT Provider Note   CSN: [id number] Arrival date & time: 02/20/19  1153    History   Chief Complaint Chief Complaint  Patient presents with  . Rectal Bleeding    Covid+    HPI Benjamin [name] is a 53 y.o. male.     HPI   Benjamin [name] is a 53 y.o. male, with a history of A. fib, DM, HTN, PE on Xarelto, presenting to the ED with rectal bleeding for last 2 days.  He notes red blood filling the toilet over the course of about 5 episodes.  Also endorses right lower quadrant abdominal pain, described as a soreness, mild to moderate, nonradiating.  This abdominal pain started just before the bleeding.  Patient states he was recently changed from warfarin (13.5 mg daily) to Xarelto.  He has not yet started taking the Xarelto.  His last dose of warfarin was 2 days ago.  His PCP switched him from Coumadin to Xarelto due to his recent positive COVID-19 status to help him avoid having to go in for INR checks. Patient has been on Coumadin for years due to A. fib, however, beginning of May he was found to have PEs despite his anticoagulation. He was recently admitted 5/20-5/26 for shortness of breath.  He was also positive for COVID-19 at that time.  He states he stopped having a cough or shortness of breath 3 days ago.  Denies fever/chills, melena, syncope, dizziness, chest pain, shortness of breath, cough, or any other complaints.   Past Medical History:  Diagnosis Date  . A-fib (HCC)   . Diabetes mellitus without complication (HCC)   . Hypertension   . Obesity   . Pulmonary embolism (HCC)   . Sleep apnea     Patient Active Problem List   Diagnosis Date Noted  . Dyspnea 02/06/2019  . COVID-19 virus infection 02/06/2019  . Elevated troponin 02/06/2019  . Bilateral lower extremity edema   . Obesity, Class III, BMI 40-49.9 (morbid obesity) (HCC)   . Acute on chronic diastolic CHF (congestive heart failure) (HCC)   . PAF  (paroxysmal atrial fibrillation) (HCC)   . Acute pulmonary embolism (HCC) 01/21/2019  . Hypertensive urgency 01/21/2019  . Diabetes mellitus type 2 in obese (HCC) 01/21/2019    Past Surgical History:  Procedure Laterality Date  . NO PAST SURGERIES          Home Medications    Prior to Admission medications   Medication Sig Start Date End Date Taking? Authorizing Provider  amLODipine (NORVASC) 10 MG tablet Take 10 mg by mouth daily. 01/03/19  Yes [provider]  atorvastatin (LIPITOR) 40 MG tablet Take 40 mg by mouth at bedtime.  01/03/19  Yes [provider]  carboxymethylcellulose (REFRESH PLUS) 0.5 % SOLN Place 1 drop into both eyes daily as needed (dry eyes).   Yes [provider]  furosemide (LASIX) 80 MG tablet Take 80 mg by mouth 3 (three) times daily. 01/03/19  Yes [provider]  labetalol (NORMODYNE) 200 MG tablet Take 200 mg by mouth daily. 01/03/19  Yes [provider]  lisinopril (ZESTRIL) 40 MG tablet Take 40 mg by mouth daily. 01/03/19  Yes [provider]  metFORMIN (GLUCOPHAGE) 500 MG tablet Take 500 mg by mouth 2 (two) times a day. 01/03/19  Yes [provider]  methocarbamol (ROBAXIN) 500 MG tablet Take 1 tablet (500 mg total) by mouth every 8 (eight) hours as needed for muscle spasms.  02/14/19  Yes Leroy Sea, MD  omeprazole (PRILOSEC) 40 MG capsule Take 40 mg by mouth daily before breakfast. 01/03/19  Yes [provider]  spironolactone (ALDACTONE) 50 MG tablet Take 50 mg by mouth daily. 01/03/19  Yes [provider]  Throat Lozenges (COUGH DROPS MT) 1 drop as needed (for throat dryness).   Yes [provider]  triamcinolone cream (KENALOG) 0.1 % Apply 1 application topically 3 (three) times daily. 02/01/19  Yes [provider]  warfarin (COUMADIN) 6 MG tablet Take 6 mg by mouth daily.   Yes [provider]  rivaroxaban (XARELTO) 20 MG TABS tablet Take 1 tablet  (20 mg total) by mouth daily with supper. Skip one dose of coumadin and start xarelto on the following day. 02/18/19 05/19/19  Claiborne Rigg, NP    Family History Family History  Problem Relation Age of Onset  . Hypertension Mother   . Diabetes Mother     Social History Social History   Tobacco Use  . Smoking status: Former Games developer  . Smokeless tobacco: Never Used  Substance Use Topics  . Alcohol use: Not Currently  . Drug use: Not Currently     Allergies   Patient has no known allergies.   Review of Systems Review of Systems  Constitutional: Negative for chills and fever.  Respiratory: Negative for cough and shortness of breath.   Cardiovascular: Negative for chest pain.  Gastrointestinal: Positive for abdominal pain and blood in stool. Negative for nausea and vomiting.  Neurological: Negative for dizziness, syncope, weakness and light-headedness.  All other systems reviewed and are negative.    Physical Exam Updated Vital Signs BP (!) 151/101 (BP Location: Right Arm)   Pulse 92   Temp 99.3 F (37.4 C) (Oral)   Resp (!) 24   SpO2 98%   Physical Exam Vitals signs and nursing note reviewed.  Constitutional:      General: He is not in acute distress.    Appearance: He is well-developed. He is obese. He is not diaphoretic.  HENT:     Head: Normocephalic and atraumatic.     Mouth/Throat:     Mouth: Mucous membranes are moist.     Pharynx: Oropharynx is clear.  Eyes:     Conjunctiva/sclera: Conjunctivae normal.  Neck:     Musculoskeletal: Neck supple.  Cardiovascular:     Rate and Rhythm: Normal rate and regular rhythm.     Pulses: Normal pulses.          Radial pulses are 2+ on the right side and 2+ on the left side.       Posterior tibial pulses are 2+ on the right side and 2+ on the left side.     Heart sounds: Normal heart sounds.     Comments: Tactile temperature in the extremities appropriate and equal bilaterally. Pulmonary:     Effort: Pulmonary  effort is normal. No respiratory distress.     Breath sounds: Normal breath sounds.  Abdominal:     Palpations: Abdomen is soft.     Tenderness: There is abdominal tenderness in the right lower quadrant. There is no guarding.     Comments: Very mild tenderness that seems to be more superficial.  Genitourinary:    Rectum: Guaiac result positive.     Comments: Rectal Exam:  No external hemorrhoids, fissures, or lesions noted.  No frank blood or melena. No stool burden.  No rectal tenderness. No foreign bodies noted.   Med Alen Blew,  served as Biomedical engineer during the rectal exam. Musculoskeletal:     Right lower leg: No edema.     Left lower leg: No edema.  Lymphadenopathy:     Cervical: No cervical adenopathy.  Skin:    General: Skin is warm and dry.  Neurological:     Mental Status: He is alert.  Psychiatric:        Mood and Affect: Mood and affect normal.        Speech: Speech normal.        Behavior: Behavior normal.      ED Treatments / Results  Labs (all labs ordered are listed, but only abnormal results are displayed) Labs Reviewed  CBC WITH DIFFERENTIAL/PLATELET - Abnormal; Notable for the following components:      Result Value   Hemoglobin 11.6 (*)    MCH 23.7 (*)    MCHC 29.4 (*)    Platelets 413 (*)    All other components within normal limits  PROTIME-INR - Abnormal; Notable for the following components:   Prothrombin Time 21.3 (*)    INR 1.9 (*)    All other components within normal limits  APTT - Abnormal; Notable for the following components:   aPTT 37 (*)    All other components within normal limits  POC OCCULT BLOOD, ED - Abnormal; Notable for the following components:   Fecal Occult Bld POSITIVE (*)    All other components within normal limits  SARS CORONAVIRUS 2  COMPREHENSIVE METABOLIC PANEL   Hemoglobin  Date Value Ref Range Status  02/20/2019 11.6 (L) 13.0 - 17.0 g/dL Final  81/59/4707 61.5 (L) 13.0 - 17.0 g/dL Final  18/34/3735 78.9 (L)  13.0 - 17.0 g/dL Final  78/47/8412 82.0 (L) 13.0 - 17.0 g/dL Final   EKG None  Radiology No results found.  Procedures Procedures (including critical care time)  Medications Ordered in ED Medications - No data to display   Initial Impression / Assessment and Plan / ED Course  I have reviewed the triage vital signs and the nursing notes.  Pertinent labs & imaging results that were available during my care of the patient were reviewed by me and considered in my medical decision making (see chart for details).  Clinical Course as of Feb 19 1606  Wed Feb 20, 2019  1413 Spoke with Dr. Ewing Schlein, [facility].  Recommends medical admission for observation for continued bleeding.  If patient is able to be removed from his Xarelto, then do that. Otherwise, brainstorm solutions with inpatient team.   [SJ]  7538 53 year old male with recent Covid positive here with 4 episodes of rectal bleeding.  He looks very well and is not tachycardic or hypotensive.  He is heme positive from below but has had no further rectal bleeding episodes here.  He is anticoagulated so I will review this with GI.   [MB]  69 Family medicine called and states patient is a bounceback to [employer].   [SJ]  1521 Spoke with Dr. Katrinka Blazing, hospitalist. Agrees to admit the patient.   [SJ]    Clinical Course User Index [MB] Terrilee Files, MD [SJ] Anselm Pancoast, PA-C       Patient presents with rectal bleeding. Patient is nontoxic appearing, afebrile, not tachycardic, not tachypneic, not hypotensive, maintains excellent SPO2 on room air, and is in no apparent distress.  Hemoccult positive, but no frank blood on exam.  Hemoglobin stable from previous. Last Coumadin was 2 days ago with INR today of 1.9.  Patient admitted for observation due to rectal bleeding on anticoagulation.  Repeat COVID-19 testing pending at time of admission.  Findings and plan of care discussed with Meridee ScoreMichael Butler, MD. Dr. Charm BargesButler personally  evaluated and examined this patient.  Vitals:   02/20/19 1330 02/20/19 1345 02/20/19 1416 02/20/19 1439  BP: (!) 168/92 (!) 151/91 127/86 134/80  Pulse: 79 81 87 90  Resp: 18 15 (!) 23 17  Temp:      TempSrc:      SpO2: 92% 92% 96% 95%  Weight:      Height:         Final Clinical Impressions(s) / ED Diagnoses   Final diagnoses:  Rectal bleeding    ED Discharge Orders    None       Concepcion LivingJoy, Wanza Szumski C, PA-C 02/20/19 1608    Terrilee FilesButler, Michael C, MD 02/20/19 [phone]

## 2019-02-20 NOTE — ED Notes (Signed)
ED TO INPATIENT HANDOFF REPORT  ED Nurse Name and Phone #: Francine Graven Name/Age/Gender Benjamin [name] 53 y.o. male Room/Bed: 021C/021C  Code Status   Code Status: Full Code  Home/SNF/Other Home Patient oriented to: self, place, time and situation Is this baseline? Yes   Triage Complete: Triage complete  Chief Complaint Covid + rectal bleeding  Triage Note [facility], recently here for PE and has been on cumidin, was to start eliquis today. [facility] reports pt has bloody stool x4 and heard a pop in RLQ. 175/105 hr 90's cbg 86    Allergies No Known Allergies  Level of Care/Admitting Diagnosis ED Disposition    ED Disposition Condition Comment   Admit  Hospital Area: [facility] [[id number]]  Level of Care: Telemetry Medical [104]  I expect the patient will be discharged within 24 hours: No (not a candidate for 5C-Observation unit)  Covid Evaluation: Screening Protocol (No Symptoms)  Diagnosis: Rectal bleeding [217577]  Admitting Physician: Clydie Braun [[id number]]  Attending Physician: Clydie Braun [[id number]]  PT Class (Do Not Modify): Observation [104]  PT Acc Code (Do Not Modify): Observation [10022]       B Medical/Surgery History Past Medical History:  Diagnosis Date  . A-fib (HCC)   . Diabetes mellitus without complication (HCC)   . Hypertension   . Obesity   . Pulmonary embolism (HCC)   . Sleep apnea    Past Surgical History:  Procedure Laterality Date  . NO PAST SURGERIES       A IV Location/Drains/Wounds Patient Lines/Drains/Airways Status   Active Line/Drains/Airways    Name:   Placement date:   Placement time:   Site:   Days:   Peripheral IV 02/20/19 Right Antecubital   02/20/19    1244    Antecubital   less than 1          Intake/Output Last 24 hours No intake or output data in the 24 hours ending 02/20/19 1758  Labs/Imaging Results for orders placed or performed during the hospital  encounter of 02/20/19 (from the past 48 hour(s))  Comprehensive metabolic panel     Status: None   Collection Time: 02/20/19 12:26 PM  Result Value Ref Range   Sodium 138 135 - 145 mmol/L   Potassium 4.2 3.5 - 5.1 mmol/L   Chloride 101 98 - 111 mmol/L   CO2 30 22 - 32 mmol/L   Glucose, Bld 77 70 - 99 mg/dL   BUN 9 6 - 20 mg/dL   Creatinine, Ser 8.92 0.61 - 1.24 mg/dL   Calcium 9.1 8.9 - 11.9 mg/dL   Total Protein 7.4 6.5 - 8.1 g/dL   Albumin 3.5 3.5 - 5.0 g/dL   AST 20 15 - 41 U/L   ALT 24 0 - 44 U/L   Alkaline Phosphatase 42 38 - 126 U/L   Total Bilirubin 0.5 0.3 - 1.2 mg/dL   GFR calc non Af Amer >60 >60 mL/min   GFR calc Af Amer >60 >60 mL/min   Anion gap 7 5 - 15    Comment: Performed at [facility], [address], [place], [place] [address]  CBC with Differential     Status: Abnormal   Collection Time: 02/20/19 12:26 PM  Result Value Ref Range   WBC 7.9 4.0 - 10.5 K/uL   RBC 4.90 4.22 - 5.81 MIL/uL   Hemoglobin 11.6 (L) 13.0 - 17.0 g/dL   HCT 39.4  39.0 - 52.0 %   MCV 80.4 80.0 - 100.0 fL   MCH 23.7 (L) 26.0 - 34.0 pg   MCHC 29.4 (L) 30.0 - 36.0 g/dL   RDW 16.1 09.6 - 04.5 %   Platelets 413 (H) 150 - 400 K/uL   nRBC 0.0 0.0 - 0.2 %   Neutrophils Relative % 48 %   Neutro Abs 3.8 1.7 - 7.7 K/uL   Lymphocytes Relative 35 %   Lymphs Abs 2.8 0.7 - 4.0 K/uL   Monocytes Relative 11 %   Monocytes Absolute 0.9 0.1 - 1.0 K/uL   Eosinophils Relative 6 %   Eosinophils Absolute 0.5 0.0 - 0.5 K/uL   Basophils Relative 0 %   Basophils Absolute 0.0 0.0 - 0.1 K/uL   Immature Granulocytes 0 %   Abs Immature Granulocytes 0.03 0.00 - 0.07 K/uL    Comment: Performed at [facility], [address], [place], [place] [address]  Protime-INR     Status: Abnormal   Collection Time: 02/20/19 12:26 PM  Result Value Ref Range   Prothrombin Time 21.3 (H) 11.4 - 15.2 seconds   INR 1.9 (H) 0.8 - 1.2    Comment: (NOTE) INR goal varies based on device and disease  states. Performed at [facility], [address] [place], [place] [address]   APTT     Status: Abnormal   Collection Time: 02/20/19 12:26 PM  Result Value Ref Range   aPTT 37 (H) 24 - 36 seconds    Comment:        IF BASELINE aPTT IS ELEVATED, SUGGEST PATIENT RISK ASSESSMENT BE USED TO DETERMINE APPROPRIATE ANTICOAGULANT THERAPY. Performed at [facility], [address], [place], [place] [address]   POC occult blood, ED Provider will collect     Status: Abnormal   Collection Time: 02/20/19 12:35 PM  Result Value Ref Range   Fecal Occult Bld POSITIVE (A) NEGATIVE  SARS Coronavirus 2     Status: None   Collection Time: 02/20/19  2:36 PM  Result Value Ref Range   SARS Coronavirus 2 NOT DETECTED NOT DETECTED    Comment: (NOTE) SARS-CoV-2 target nucleic acids are NOT DETECTED. The SARS-CoV-2 RNA is generally detectable in upper and lower respiratory specimens during the acute phase of infection.  Negative  results do not preclude SARS-CoV-2 infection, do not rule out co-infections with other pathogens, and should not be used as the sole basis for treatment or other patient management decisions.  Negative results must be combined with clinical observations, patient history, and epidemiological information. The expected result is Not Detected. Fact Sheet for Patients: http://www.biofiredefense.com/wp-content/uploads/2020/03/BIOFIRE-COVID -19-patients.pdf Fact Sheet for Healthcare Providers: http://www.biofiredefense.com/wp-content/uploads/2020/03/BIOFIRE-COVID -19-hcp.pdf This test is not yet approved or cleared by the [place] and  has been authorized for detection and/or diagnosis of SARS-CoV-2 by FDA under an Emergency Use Authorization (EUA).  This EUA will remain in effec t (meaning this test can be used) for the duration of  the COVID-19 declaration under Section 564(b)(1) of the Act, 21 U.S.C. section 360bbb-3(b)(1), unless the authorization  is terminated or revoked sooner. Performed at [facility], [address], [place], [place] [address]    No results found.  Pending Labs Unresulted Labs (From admission, onward)    Start     Ordered   02/21/19 0500  CBC  Tomorrow morning,   R     02/20/19 1632   02/21/19 0500  Basic metabolic panel  Tomorrow  morning,   R     02/20/19 1632   02/20/19 1731  Lipase, blood  Add-on,   R     02/20/19 1730   02/20/19 1724  Hemoglobin and hematocrit, blood  Once,   R     02/20/19 1723          Vitals/Pain Today's Vitals   02/20/19 1459 02/20/19 1500 02/20/19 1502 02/20/19 1719  BP:  110/75    Pulse:  86    Resp: 13     Temp:      TempSrc:      SpO2:  95%    Weight:      Height:      PainSc:   0-No pain 0-No pain    Isolation Precautions No active isolations  Medications Medications  amLODipine (NORVASC) tablet 10 mg (10 mg Oral Given 02/20/19 1716)  atorvastatin (LIPITOR) tablet 40 mg (has no administration in time range)  furosemide (LASIX) tablet 80 mg (has no administration in time range)  labetalol (NORMODYNE) tablet 200 mg (has no administration in time range)  lisinopril (ZESTRIL) tablet 40 mg (40 mg Oral Given 02/20/19 1718)  spironolactone (ALDACTONE) tablet 50 mg (has no administration in time range)  pantoprazole (PROTONIX) EC tablet 40 mg (40 mg Oral Given 02/20/19 1715)  carboxymethylcellulose (REFRESH PLUS) 0.5 % ophthalmic solution 1 drop (has no administration in time range)  triamcinolone cream (KENALOG) 0.1 % 1 application (has no administration in time range)  ondansetron (ZOFRAN) tablet 4 mg (has no administration in time range)    Or  ondansetron (ZOFRAN) injection 4 mg (has no administration in time range)  acetaminophen (TYLENOL) tablet 650 mg (has no administration in time range)    Or  acetaminophen (TYLENOL) suppository 650 mg (has no administration in time range)  albuterol (PROVENTIL) (2.5 MG/3ML) 0.083% nebulizer solution 2.5 mg (has no  administration in time range)    Mobility walks with person assist Low fall risk   Focused Assessments    R Recommendations: See Admitting Provider Note  Report given to:   Additional Notes:

## 2019-02-20 NOTE — ED Triage Notes (Signed)
[facility] from [facility], recently here for PE and has been on cumidin, was to start eliquis today. [facility] reports pt has bloody stool x4 and heard a pop in RLQ. 175/105 hr 90's cbg 86

## 2019-02-20 NOTE — Progress Notes (Signed)
Patient admitted to unit from ED, VSS. Patient settled into chair and oriented to unit and hospital routine. Pt resting comfortably in chair with call bell in reach. Instructed patient to utilize call bell for assistance. Will continue to monitor closely for remainder of shift.

## 2019-02-20 NOTE — ED Notes (Signed)
Report attempted 

## 2019-02-20 NOTE — H&P (Signed)
History and Physical    YEAB CODD XYI:[id number] DOB: 11-10-1965 DOA: 02/20/2019  Referring MD/NP/PA: Harolyn Rutherford, PA-C PCP: Claiborne Rigg, NP  Patient [facility] from: Halfway house [facility] EMS  Chief Complaint: Blood in stool  I have personally briefly reviewed patient's old medical records in [facility]   HPI: Benjamin [name] is a 53 y.o. male with medical history significant of PAF on Coumadin, DM type II, hypertension, morbid obesity, and pulmonary embolus on 5/4; who presents with reports of blood mixed in his stools.  Blood was bright red in color and filled the toilet bowl.  He reports having 5 similar bowel movements since onset of symptoms.  Associated symptoms include right lower quadrant abdominal pain and soreness.  States he was changed by his primary care provider to Xarelto due to his recent COVID-19 status to reduce need of INR checked, but just received a prescription today in the mail.  Admitted into the hospital from 5/4-5/7;  diagnosed with bilateral pulmonary embolism where his INR was found to be subtherapeutic.  It was discussed changing him to to 1 of the newer agents, but patient not a good candidate due to his weight.  He was admitted into the hospital on 5/23- 526 for shortness of breath found to be COVID-19 positive at that time.  Repeat CT angiogram of the chest on 5/20, was of poor quality and.  Showed multiple opacities concerning for viral pneumonia, but no clear signs of PE.  Denies having any cough or shortness of breath or fever since that time.  ED Course: On admission to the emergency department patient was seen to be afebrile, respirations 11-24, blood pressure elevated to 168/92, and O2 saturation maintained on room air.  Labs revealed WBC 11.6 with low MCV, platelets 413, INR 1.9, and CMP within normal limits.  No imaging studies were performed.  TRH called to admit into the hospital.  Review of Systems  Constitutional: Negative for chills, fever  and malaise/fatigue.  HENT: Negative for ear discharge and nosebleeds.   Eyes: Negative for double vision.  Respiratory: Negative for cough and shortness of breath.   Cardiovascular: Negative for chest pain and leg swelling.  Gastrointestinal: Positive for abdominal pain, blood in stool and diarrhea. Negative for nausea and vomiting.  Genitourinary: Negative for dysuria and hematuria.  Musculoskeletal: Positive for myalgias. Negative for falls.  Skin: Negative for itching.  Neurological: Negative for loss of consciousness.  Psychiatric/Behavioral: Negative for memory loss and substance abuse.    Past Medical History:  Diagnosis Date  . A-fib (HCC)   . Diabetes mellitus without complication (HCC)   . Hypertension   . Obesity   . Pulmonary embolism (HCC)   . Sleep apnea     Past Surgical History:  Procedure Laterality Date  . NO PAST SURGERIES       reports that he has quit smoking. He has never used smokeless tobacco. He reports previous alcohol use. He reports previous drug use.  No Known Allergies  Family History  Problem Relation Age of Onset  . Hypertension Mother   . Diabetes Mother     Prior to Admission medications   Medication Sig Start Date End Date Taking? Authorizing Provider  amLODipine (NORVASC) 10 MG tablet Take 10 mg by mouth daily. 01/03/19   [provider]  atorvastatin (LIPITOR) 40 MG tablet Take 40 mg by mouth at bedtime.  01/03/19   [provider]  furosemide (LASIX) 80 MG tablet Take 80 mg  by mouth 3 (three) times daily. 01/03/19   [provider]  labetalol (NORMODYNE) 200 MG tablet Take 200 mg by mouth daily. 01/03/19   [provider]  lisinopril (ZESTRIL) 40 MG tablet Take 40 mg by mouth daily. 01/03/19   [provider]  metFORMIN (GLUCOPHAGE) 500 MG tablet Take 500 mg by mouth 2 (two) times a day. 01/03/19   [provider]  methocarbamol (ROBAXIN) 500 MG tablet Take 1 tablet (500 mg total) by  mouth every 8 (eight) hours as needed for muscle spasms. 02/14/19   Leroy Sea, MD  omeprazole (PRILOSEC) 40 MG capsule Take 40 mg by mouth daily before breakfast. 01/03/19   [provider]  rivaroxaban (XARELTO) 20 MG TABS tablet Take 1 tablet (20 mg total) by mouth daily with supper. Skip one dose of coumadin and start xarelto on the following day. 02/18/19 05/19/19  Claiborne Rigg, NP  spironolactone (ALDACTONE) 50 MG tablet Take 50 mg by mouth daily. 01/03/19   [provider]  Throat Lozenges (COUGH DROPS MT) 1 drop as needed (for throat dryness).    [provider]  triamcinolone cream (KENALOG) 0.1 % Apply 1 application topically 3 (three) times daily. 02/01/19   [provider]    Physical Exam:  Constitutional: Morbidly obese male in NAD, calm, comfortable Vitals:   02/20/19 1330 02/20/19 1345 02/20/19 1416 02/20/19 1439  BP: (!) 168/92 (!) 151/91 127/86 134/80  Pulse: 79 81 87 90  Resp: 18 15 (!) 23 17  Temp:      TempSrc:      SpO2: 92% 92% 96% 95%  Weight:      Height:       Eyes: PERRL, lids and conjunctivae normal ENMT: Mucous membranes are moist. Posterior pharynx clear of any exudate or lesions.Normal dentition.  Neck: normal, supple, no masses, no thyromegaly Respiratory: clear to auscultation bilaterally, no wheezing, no crackles. Normal respiratory effort. No accessory muscle use.  Cardiovascular: Regular rate and rhythm, no murmurs / rubs / gallops. No extremity edema. 2+ pedal pulses. No carotid bruits.  Abdomen: TTP of the right lower quadrant with. No hepatosplenomegaly. Bowel sounds positive. Tenderness over the area where he gives his Lovenox injections, appreciated subcutaneous nodules.  Musculoskeletal: no clubbing / cyanosis. No joint deformity upper and lower extremities. Good ROM, no contractures. Normal muscle tone.  Skin: no rashes, lesions, ulcers. No induration Neurologic: CN 2-12 grossly intact. Sensation intact,  DTR normal. Strength 5/5 in all 4.  Psychiatric: Normal judgment and insight. Alert and oriented x 3. Normal mood.     Labs on Admission: I have personally reviewed following labs and imaging studies  CBC: Recent Labs  Lab 02/20/19 1226  WBC 7.9  NEUTROABS 3.8  HGB 11.6*  HCT 39.4  MCV 80.4  PLT 413*   Basic Metabolic Panel: Recent Labs  Lab 02/20/19 1226  NA 138  K 4.2  CL 101  CO2 30  GLUCOSE 77  BUN 9  CREATININE 1.07  CALCIUM 9.1   GFR: Estimated Creatinine Clearance: 132 mL/min (by C-G formula based on SCr of 1.07 mg/dL). Liver Function Tests: Recent Labs  Lab 02/20/19 1226  AST 20  ALT 24  ALKPHOS 42  BILITOT 0.5  PROT 7.4  ALBUMIN 3.5   No results for input(s): LIPASE, AMYLASE in the last 168 hours. No results for input(s): AMMONIA in the last 168 hours. Coagulation Profile: Recent Labs  Lab 02/14/19 0400 02/20/19 1226  INR 3.0* 1.9*  Cardiac Enzymes: No results for input(s): CKTOTAL, CKMB, CKMBINDEX, TROPONINI in the last 168 hours. BNP (last 3 results) No results for input(s): PROBNP in the last 8760 hours. HbA1C: No results for input(s): HGBA1C in the last 72 hours. CBG: Recent Labs  Lab 02/13/19 1646 02/13/19 2201 02/14/19 0729 02/14/19 1134  GLUCAP 106* 116* 100* 107*   Lipid Profile: No results for input(s): CHOL, HDL, LDLCALC, TRIG, CHOLHDL, LDLDIRECT in the last 72 hours. Thyroid Function Tests: No results for input(s): TSH, T4TOTAL, FREET4, T3FREE, THYROIDAB in the last 72 hours. Anemia Panel: No results for input(s): VITAMINB12, FOLATE, FERRITIN, TIBC, IRON, RETICCTPCT in the last 72 hours. Urine analysis:    Component Value Date/Time   COLORURINE YELLOW 02/07/2019 1042   APPEARANCEUR HAZY (A) 02/07/2019 1042   LABSPEC 1.043 (H) 02/07/2019 1042   PHURINE 5.0 02/07/2019 1042   GLUCOSEU NEGATIVE 02/07/2019 1042   HGBUR NEGATIVE 02/07/2019 1042   BILIRUBINUR NEGATIVE 02/07/2019 1042   KETONESUR NEGATIVE 02/07/2019 1042    PROTEINUR 30 (A) 02/07/2019 1042   NITRITE NEGATIVE 02/07/2019 1042   LEUKOCYTESUR NEGATIVE 02/07/2019 1042   Sepsis Labs: No results found for this or any previous visit (from the past 240 hour(s)).   Radiological Exams on Admission: No results found.  EKG: Independently reviewed.  Sinus rhythm at 92bpm syndrome ST changes from previous EKG.  Assessment/Plan Hematochezia, hypochromic anemia: Patient presents with reports of hematochezia.  Hemoglobin initially 11.6 which appears near his baseline per review of records.  No reoccurrence of  Bleeding since being in the hospital.  -Admit to a medical telemetry bed -Serial monitoring of H&H -Monitor intake and output  Recent history of PE: Patient diagnosed with a pulmonary embolus on 5/4 where INR was subtherapetic.  Repeat CT angiogram of the chest a poor quality on 5/20 when he came in positive for COVID, but no acute pulmonary embolus is appreciated.  Patient's primary had tried to start him on Xarelto which she had not started yet. - Start Lovenox shots instead as efficacy of newer anticoagulation agents have not been studied  Paroxysmal atrial fibrillation: Patient appears to be in sinus rhythm at this time. CHA2DS2-VASc score = 3. - Continue Labetalol  Subtherapeutic INR: INR 1.9 on admission.  Patient reports being in the process of transitioning to Xarelto at the advice of his primary care provider. - Lovenox injections if hemoglobin stable  Essential Hypertension: stable. - Continue labetalol, amlodipine, lisinopril  Abdominal pain: Right lower quadrant abdominal pain appears secondary to Lovenox injections. - Continue to monitor  Diabetes Mellitus type 2: Uncontrolled. HbgA1c 7.6 - Hypoglycemic protocol - hold metformin - CBG q AC with Moderate SSI  Morbid obesity: BMI 55 kg/m.   DVT prophylaxis: SCDs Code Status: Full Family Communication:  No family present at beside Disposition Plan: Likely discharge home in  a.m. if work-up negative Consults called: None Admission status: Observation  Clydie Braunondell A Smith MD [facility] Pager [phone]   If 7PM-7AM, please contact night-coverage [email or web address] Password [email or web address]  02/20/2019, 3:22 PM

## 2019-02-20 NOTE — Progress Notes (Signed)
Discussed with [place]. Since patient has been asymptomatic for 3 days, tested positive over 10 days ago, and tested negative today, patient does not need isolation patient can be admitted to 6N.

## 2019-02-21 DIAGNOSIS — I5033 Acute on chronic diastolic (congestive) heart failure: Secondary | ICD-10-CM | POA: Diagnosis not present

## 2019-02-21 DIAGNOSIS — K644 Residual hemorrhoidal skin tags: Secondary | ICD-10-CM | POA: Diagnosis not present

## 2019-02-21 DIAGNOSIS — N179 Acute kidney failure, unspecified: Secondary | ICD-10-CM | POA: Diagnosis not present

## 2019-02-21 DIAGNOSIS — Z8249 Family history of ischemic heart disease and other diseases of the circulatory system: Secondary | ICD-10-CM | POA: Diagnosis not present

## 2019-02-21 DIAGNOSIS — Z7901 Long term (current) use of anticoagulants: Secondary | ICD-10-CM | POA: Diagnosis not present

## 2019-02-21 DIAGNOSIS — K921 Melena: Secondary | ICD-10-CM | POA: Diagnosis not present

## 2019-02-21 DIAGNOSIS — K625 Hemorrhage of anus and rectum: Secondary | ICD-10-CM | POA: Diagnosis not present

## 2019-02-21 DIAGNOSIS — Z86711 Personal history of pulmonary embolism: Secondary | ICD-10-CM | POA: Diagnosis not present

## 2019-02-21 DIAGNOSIS — Z87891 Personal history of nicotine dependence: Secondary | ICD-10-CM | POA: Diagnosis not present

## 2019-02-21 DIAGNOSIS — Z9981 Dependence on supplemental oxygen: Secondary | ICD-10-CM | POA: Diagnosis not present

## 2019-02-21 DIAGNOSIS — Z7984 Long term (current) use of oral hypoglycemic drugs: Secondary | ICD-10-CM | POA: Diagnosis not present

## 2019-02-21 DIAGNOSIS — D500 Iron deficiency anemia secondary to blood loss (chronic): Secondary | ICD-10-CM | POA: Diagnosis not present

## 2019-02-21 DIAGNOSIS — E119 Type 2 diabetes mellitus without complications: Secondary | ICD-10-CM | POA: Diagnosis not present

## 2019-02-21 DIAGNOSIS — Z6841 Body Mass Index (BMI) 40.0 and over, adult: Secondary | ICD-10-CM | POA: Diagnosis not present

## 2019-02-21 DIAGNOSIS — K922 Gastrointestinal hemorrhage, unspecified: Secondary | ICD-10-CM | POA: Diagnosis not present

## 2019-02-21 DIAGNOSIS — R222 Localized swelling, mass and lump, trunk: Secondary | ICD-10-CM | POA: Diagnosis not present

## 2019-02-21 DIAGNOSIS — Z79899 Other long term (current) drug therapy: Secondary | ICD-10-CM | POA: Diagnosis not present

## 2019-02-21 DIAGNOSIS — R791 Abnormal coagulation profile: Secondary | ICD-10-CM | POA: Diagnosis present

## 2019-02-21 DIAGNOSIS — D649 Anemia, unspecified: Secondary | ICD-10-CM | POA: Diagnosis not present

## 2019-02-21 DIAGNOSIS — I5032 Chronic diastolic (congestive) heart failure: Secondary | ICD-10-CM | POA: Diagnosis not present

## 2019-02-21 DIAGNOSIS — I480 Paroxysmal atrial fibrillation: Secondary | ICD-10-CM | POA: Diagnosis present

## 2019-02-21 DIAGNOSIS — Z20828 Contact with and (suspected) exposure to other viral communicable diseases: Secondary | ICD-10-CM | POA: Diagnosis not present

## 2019-02-21 DIAGNOSIS — K649 Unspecified hemorrhoids: Secondary | ICD-10-CM | POA: Diagnosis not present

## 2019-02-21 DIAGNOSIS — E662 Morbid (severe) obesity with alveolar hypoventilation: Secondary | ICD-10-CM | POA: Diagnosis not present

## 2019-02-21 DIAGNOSIS — Z8619 Personal history of other infectious and parasitic diseases: Secondary | ICD-10-CM | POA: Diagnosis not present

## 2019-02-21 DIAGNOSIS — D62 Acute posthemorrhagic anemia: Secondary | ICD-10-CM | POA: Diagnosis not present

## 2019-02-21 DIAGNOSIS — R0602 Shortness of breath: Secondary | ICD-10-CM | POA: Diagnosis not present

## 2019-02-21 DIAGNOSIS — I110 Hypertensive heart disease with heart failure: Secondary | ICD-10-CM | POA: Diagnosis not present

## 2019-02-21 DIAGNOSIS — R1031 Right lower quadrant pain: Secondary | ICD-10-CM | POA: Diagnosis not present

## 2019-02-21 DIAGNOSIS — K648 Other hemorrhoids: Secondary | ICD-10-CM | POA: Diagnosis not present

## 2019-02-21 LAB — BASIC METABOLIC PANEL
Anion gap: 9 (ref 5–15)
BUN: 10 mg/dL (ref 6–20)
CO2: 28 mmol/L (ref 22–32)
Calcium: 8.9 mg/dL (ref 8.9–10.3)
Chloride: 100 mmol/L (ref 98–111)
Creatinine, Ser: 1.16 mg/dL (ref 0.61–1.24)
GFR calc Af Amer: >60 mL/min (ref >60)
GFR calc non Af Amer: >60 mL/min (ref >60)
Glucose, Bld: 120 mg/dL — ABNORMAL HIGH (ref 70–99)
Potassium: 4 mmol/L (ref 3.5–5.1)
Sodium: 137 mmol/L (ref 135–145)

## 2019-02-21 LAB — TYPE AND SCREEN
ABO/RH(D): O POS
Antibody Screen: NEGATIVE

## 2019-02-21 LAB — GLUCOSE, CAPILLARY
Glucose-Capillary: 120 mg/dL — ABNORMAL HIGH (ref 70–99)
Glucose-Capillary: 97 mg/dL (ref 70–99)
Glucose-Capillary: 118 mg/dL — ABNORMAL HIGH (ref 70–99)
Glucose-Capillary: 112 mg/dL — ABNORMAL HIGH (ref 70–99)

## 2019-02-21 LAB — CBC
HCT: 39.4 % (ref 39.0–52.0)
Hemoglobin: 11.8 g/dL — ABNORMAL LOW (ref 13.0–17.0)
MCH: 23.9 pg — ABNORMAL LOW (ref 26.0–34.0)
MCHC: 29.9 g/dL — ABNORMAL LOW (ref 30.0–36.0)
MCV: 79.9 fL — ABNORMAL LOW (ref 80.0–100.0)
Platelets: 367 10*3/uL (ref 150–400)
RBC: 4.93 MIL/uL (ref 4.22–5.81)
RDW: 15.4 % (ref 11.5–15.5)
WBC: 7.8 10*3/uL (ref 4.0–10.5)
nRBC: 0 % (ref 0.0–0.2)

## 2019-02-21 LAB — HEMOGLOBIN AND HEMATOCRIT, BLOOD
HCT: 39.2 % (ref 39.0–52.0)
HCT: 38.7 % — ABNORMAL LOW (ref 39.0–52.0)
HCT: 37.7 % — ABNORMAL LOW (ref 39.0–52.0)
Hemoglobin: 11.9 g/dL — ABNORMAL LOW (ref 13.0–17.0)
Hemoglobin: 11.6 g/dL — ABNORMAL LOW (ref 13.0–17.0)
Hemoglobin: 11.4 g/dL — ABNORMAL LOW (ref 13.0–17.0)

## 2019-02-21 MED ORDER — ENOXAPARIN SODIUM 300 MG/3ML IJ SOLN
180.0000 mg | Freq: Two times a day (BID) | INTRAMUSCULAR | Status: DC
  Filled 2019-02-21 (×2): qty 1.8

## 2019-02-21 NOTE — Progress Notes (Addendum)
PROGRESS NOTE    Benjamin [name]  ZJI:[id number] DOB: 1966/07/14 DOA: 02/20/2019 PCP: Claiborne Rigg, NP   Brief Narrative:  HPI: Benjamin [name] is Benjamin [name] 53 y.o. male with medical history significant of PAF on Coumadin, DM type II, hypertension, morbid obesity, and pulmonary embolus on 5/4; who presents with reports of blood mixed in his stools.  Blood was bright red in color and filled the toilet bowl.  He reports having 5 similar bowel movements since onset of symptoms.  Associated symptoms include right lower quadrant abdominal pain and soreness.  States he was changed by his primary care provider to Xarelto due to his recent COVID-19 status to reduce need of INR checked, but just received Reena Borromeo prescription today in the mail.  Admitted into the hospital from 5/4-5/7;  diagnosed with bilateral pulmonary embolism where his INR was found to be subtherapeutic.  It was discussed changing him to to 1 of the newer agents, but patient not Nhia Heaphy good candidate due to his weight.  He was admitted into the hospital on 5/23- 526 for shortness of breath found to be COVID-19 positive at that time.  Repeat CT angiogram of the chest on 5/20, was of poor quality and.  Showed multiple opacities concerning for viral pneumonia, but no clear signs of PE.  Denies having any cough or shortness of breath or fever since that time.  ED Course: On admission to the emergency department patient was seen to be afebrile, respirations 11-24, blood pressure elevated to 168/92, and O2 saturation maintained on room air.  Labs revealed WBC 11.6 with low MCV, platelets 413, INR 1.9, and CMP within normal limits.  No imaging studies were performed.  TRH called to admit into the hospital.  Assessment & Plan:   Principal Problem:   Rectal bleeding Active Problems:   Acute pulmonary embolism (HCC)   Diabetes mellitus type 2 in obese (HCC)   PAF (paroxysmal atrial fibrillation) (HCC)   Hematochezia, hypochromic anemia: Patient  presents with reports of hematochezia.   Recurrent episode 6/4 AM per nursing  Lovenox placed on hold Type and screen Clear liquid diet Discussed with GI - recommended clear liquid diet and consider bleeding scan if recurrent bleeding.  Per GI, at this point in time, due to recent covid + status and hemodynamic stability, no plans for colonoscopy at this time.  Will hold anticoagulation and consider rechallenging in next few days.  Could consider IVC filter if he fails.   Recent history of PE: Patient diagnosed with Duke Weisensel pulmonary embolus on 5/4 where INR was subtherapetic.  Repeat CT angiogram of the chest Tamaira Ciriello poor quality on 5/20 when he came in positive for COVID, but no acute pulmonary embolus is appreciated.  Patient's primary had tried to start him on Xarelto to reduce need for INR checks. - holding lovenox due to bleeding above - ultimately warfarin probably best option due to morbid obesity   Recent COVID 19 infection: pt now testing negative.  He notes he's been asx for 7+ days.   Paroxysmal atrial fibrillation: Patient appears to be in sinus rhythm at this time. CHA2DS2-VASc score = 3. - Continue Labetalol  Subtherapeutic INR: INR 1.9 on admission.   - Holding anticoagulation due to above  Essential Hypertension: stable. - Continue labetalol, amlodipine, lisinopril  Abdominal pain: Right lower quadrant abdominal pain appears secondary to Lovenox injections. - Continue to monitor  Diabetes Mellitus type 2: Uncontrolled. HbgA1c 7.6 - Hypoglycemic protocol - hold metformin - CBG q AC  with Moderate SSI  Morbid obesity: BMI 55 kg/m.  DVT prophylaxis: SCD Code Status: full  Family Communication: none at bedside Disposition Plan: pending   Consultants:   GI  Procedures:   none  Antimicrobials: Anti-infectives (From admission, onward)   None     Subjective: Feels well  Objective: Vitals:   02/20/19 1924 02/21/19 0458 02/21/19 0952 02/21/19 1510  BP: (!)  183/112 (!) 148/102 (!) 129/98 111/85  Pulse: 86 81 87 84  Resp: 16 16  15   Temp: 98.6 F (37 C) 97.9 F (36.6 C) 99 F (37.2 C) 98.3 F (36.8 C)  TempSrc: Oral Oral Oral Oral  SpO2: 97% 99% 97% 97%  Weight:      Height:        Intake/Output Summary (Last 24 hours) at 02/21/2019 1604 Last data filed at 02/21/2019 1345 Gross per 24 hour  Intake 720 ml  Output 1 ml  Net 719 ml   Filed Weights   02/20/19 1226  Weight: (!) 179.2 kg    Examination:  General exam: Appears calm and comfortable  Respiratory system: Clear to auscultation. Respiratory effort normal. Cardiovascular system: S1 & S2 heard, RRR. Gastrointestinal system: Abdomen is nondistended, soft and nontender. Central nervous system: Alert and oriented. No focal neurological deficits. Extremities: no lee Skin: No rashes, lesions or ulcers Psychiatry: Judgement and insight appear normal. Mood & affect appropriate.     Data Reviewed: I have personally reviewed following labs and imaging studies  CBC: Recent Labs  Lab 02/20/19 1226 02/20/19 1801 02/21/19 0412 02/21/19 1053 02/21/19 1523  WBC 7.9  --  7.8  --   --   NEUTROABS 3.8  --   --   --   --   HGB 11.6* 11.9* 11.8* 11.9* 11.6*  HCT 39.4 40.1 39.4 39.2 38.7*  MCV 80.4  --  79.9*  --   --   PLT 413*  --  367  --   --    Basic Metabolic Panel: Recent Labs  Lab 02/20/19 1226 02/21/19 0412  NA 138 137  K 4.2 4.0  CL 101 100  CO2 30 28  GLUCOSE 77 120*  BUN 9 10  CREATININE 1.07 1.16  CALCIUM 9.1 8.9   GFR: Estimated Creatinine Clearance: 121.8 mL/min (by C-G formula based on SCr of 1.16 mg/dL). Liver Function Tests: Recent Labs  Lab 02/20/19 1226  AST 20  ALT 24  ALKPHOS 42  BILITOT 0.5  PROT 7.4  ALBUMIN 3.5   Recent Labs  Lab 02/20/19 1801  LIPASE 23   No results for input(s): AMMONIA in the last 168 hours. Coagulation Profile: Recent Labs  Lab 02/20/19 1226  INR 1.9*   Cardiac Enzymes: No results for input(s):  CKTOTAL, CKMB, CKMBINDEX, TROPONINI in the last 168 hours. BNP (last 3 results) No results for input(s): PROBNP in the last 8760 hours. HbA1C: No results for input(s): HGBA1C in the last 72 hours. CBG: Recent Labs  Lab 02/20/19 2220 02/21/19 0734 02/21/19 1247  GLUCAP 110* 120* 97   Lipid Profile: No results for input(s): CHOL, HDL, LDLCALC, TRIG, CHOLHDL, LDLDIRECT in the last 72 hours. Thyroid Function Tests: No results for input(s): TSH, T4TOTAL, FREET4, T3FREE, THYROIDAB in the last 72 hours. Anemia Panel: No results for input(s): VITAMINB12, FOLATE, FERRITIN, TIBC, IRON, RETICCTPCT in the last 72 hours. Sepsis Labs: No results for input(s): PROCALCITON, LATICACIDVEN in the last 168 hours.  Recent Results (from the past 240 hour(s))  SARS Coronavirus 2  Status: None   Collection Time: 02/20/19  2:36 PM  Result Value Ref Range Status   SARS Coronavirus 2 NOT DETECTED NOT DETECTED Final    Comment: (NOTE) SARS-CoV-2 target nucleic acids are NOT DETECTED. The SARS-CoV-2 RNA is generally detectable in upper and lower respiratory specimens during the acute phase of infection.  Negative  results do not preclude SARS-CoV-2 infection, do not rule out co-infections with other pathogens, and should not be used as the sole basis for treatment or other patient management decisions.  Negative results must be combined with clinical observations, patient history, and epidemiological information. The expected result is Not Detected. Fact Sheet for Patients: http://www.biofiredefense.com/wp-content/uploads/2020/03/BIOFIRE-COVID -19-patients.pdf Fact Sheet for Healthcare Providers: http://www.biofiredefense.com/wp-content/uploads/2020/03/BIOFIRE-COVID -19-hcp.pdf This test is not yet approved or cleared by the [place] and  has been authorized for detection and/or diagnosis of SARS-CoV-2 by FDA under an Emergency Use Authorization (EUA).  This EUA will remain in effec t  (meaning this test can be used) for the duration of  the COVID-19 declaration under Section 564(b)(1) of the Act, 21 U.S.C. section 360bbb-3(b)(1), unless the authorization is terminated or revoked sooner. Performed at [facility], [address], [place], [place] [address]          Radiology Studies: No results found.      Scheduled Meds: . amLODipine  10 mg Oral Daily  . atorvastatin  40 mg Oral QHS  . furosemide  80 mg Oral TID  . insulin aspart  0-15 Units Subcutaneous TID WC  . insulin aspart  0-5 Units Subcutaneous QHS  . labetalol  200 mg Oral Daily  . lisinopril  40 mg Oral Daily  . pantoprazole  40 mg Oral Daily  . spironolactone  50 mg Oral Daily  . triamcinolone cream  1 application Topical TID   Continuous Infusions:   LOS: 0 days    Time spent: over 30 min    Lacretia Nicks, MD [facility] Pager AMION  If 7PM-7AM, please contact night-coverage [email or web address] Password [email or web address] 02/21/2019, 4:04 PM

## 2019-02-21 NOTE — Progress Notes (Signed)
Patient's case discussed with the hospital team and if signs of increased bleeding consider nuclear bleeding scan otherwise continue clear liquids and decide whether patient needs a filter or not and also evaluate whether he is covid positive for negative and we will be available for further if needed questions or problems

## 2019-02-21 NOTE — Progress Notes (Signed)
Patient had very large bowel movement, large amount of frank red blood in commode and on floor. Patient is asymptomatic, vss. MD paged and new orders received. Patient sitting in chair at present with call light within reach. Will continue to monitor closely for remainder of shift.

## 2019-02-21 NOTE — Progress Notes (Signed)
Patient refused CPAP tonight 

## 2019-02-22 ENCOUNTER — Inpatient Hospital Stay (HOSPITAL_COMMUNITY): Payer: Medicaid Other

## 2019-02-22 ENCOUNTER — Encounter (HOSPITAL_COMMUNITY): Payer: Self-pay | Admitting: Family Medicine

## 2019-02-22 DIAGNOSIS — R609 Edema, unspecified: Secondary | ICD-10-CM

## 2019-02-22 LAB — GLUCOSE, CAPILLARY
Glucose-Capillary: 127 mg/dL — ABNORMAL HIGH (ref 70–99)
Glucose-Capillary: 122 mg/dL — ABNORMAL HIGH (ref 70–99)
Glucose-Capillary: 99 mg/dL (ref 70–99)
Glucose-Capillary: 111 mg/dL — ABNORMAL HIGH (ref 70–99)

## 2019-02-22 LAB — COMPREHENSIVE METABOLIC PANEL
ALT: 23 U/L (ref 0–44)
AST: 20 U/L (ref 15–41)
Albumin: 3.4 g/dL — ABNORMAL LOW (ref 3.5–5.0)
Alkaline Phosphatase: 42 U/L (ref 38–126)
Anion gap: 10 (ref 5–15)
BUN: 11 mg/dL (ref 6–20)
CO2: 28 mmol/L (ref 22–32)
Calcium: 8.7 mg/dL — ABNORMAL LOW (ref 8.9–10.3)
Chloride: 99 mmol/L (ref 98–111)
Creatinine, Ser: 1.46 mg/dL — ABNORMAL HIGH (ref 0.61–1.24)
GFR calc Af Amer: >60 mL/min (ref >60)
GFR calc non Af Amer: 54 mL/min — ABNORMAL LOW (ref >60)
Glucose, Bld: 149 mg/dL — ABNORMAL HIGH (ref 70–99)
Potassium: 3.8 mmol/L (ref 3.5–5.1)
Sodium: 137 mmol/L (ref 135–145)
Total Bilirubin: 0.6 mg/dL (ref 0.3–1.2)
Total Protein: 7.2 g/dL (ref 6.5–8.1)

## 2019-02-22 LAB — CBC
HCT: 37.9 % — ABNORMAL LOW (ref 39.0–52.0)
Hemoglobin: 11.3 g/dL — ABNORMAL LOW (ref 13.0–17.0)
MCH: 23.8 pg — ABNORMAL LOW (ref 26.0–34.0)
MCHC: 29.8 g/dL — ABNORMAL LOW (ref 30.0–36.0)
MCV: 80 fL (ref 80.0–100.0)
Platelets: 383 10*3/uL (ref 150–400)
RBC: 4.74 MIL/uL (ref 4.22–5.81)
RDW: 15.3 % (ref 11.5–15.5)
WBC: 7.5 10*3/uL (ref 4.0–10.5)
nRBC: 0 % (ref 0.0–0.2)

## 2019-02-22 LAB — HEMOGLOBIN AND HEMATOCRIT, BLOOD
HCT: 36.1 % — ABNORMAL LOW (ref 39.0–52.0)
HCT: 35.5 % — ABNORMAL LOW (ref 39.0–52.0)
Hemoglobin: 10.9 g/dL — ABNORMAL LOW (ref 13.0–17.0)
Hemoglobin: 10.9 g/dL — ABNORMAL LOW (ref 13.0–17.0)

## 2019-02-22 NOTE — Progress Notes (Signed)
Bilateral lower extremity venous duplex has been completed. Preliminary results can be found in CV Proc through chart review.   02/22/19 1:22 PM Olen Cordial RVT

## 2019-02-22 NOTE — Plan of Care (Signed)
  Problem: Clinical Measurements: Goal: Ability to maintain clinical measurements within normal limits will improve Outcome: Progressing   Problem: Clinical Measurements: Goal: Will remain free from infection Outcome: Progressing   Problem: Clinical Measurements: Goal: Respiratory complications will improve Outcome: Progressing   Problem: Clinical Measurements: Goal: Cardiovascular complication will be avoided Outcome: Progressing   

## 2019-02-22 NOTE — Progress Notes (Addendum)
PROGRESS NOTE    [name]  MBT:[id number] DOB: 11/27/1965 DOA: 02/20/2019 PCP: Claiborne Rigg, NP   Brief Narrative:  HPI: Benjamin [name] is a 53 y.o. male with medical history significant of PAF on Coumadin, DM type II, hypertension, morbid obesity, and pulmonary embolus on 5/4; who presents with reports of blood mixed in his stools.  Blood was bright red in color and filled the toilet bowl.  He reports having 5 similar bowel movements since onset of symptoms.  Associated symptoms include right lower quadrant abdominal pain and soreness.  States he was changed by his primary care provider to Xarelto due to his recent COVID-19 status to reduce need of INR checked, but just received a prescription today in the mail.  Admitted into the hospital from 5/4-5/7;  diagnosed with bilateral pulmonary embolism where his INR was found to be subtherapeutic.  It was discussed changing him to to 1 of the newer agents, but patient not a good candidate due to his weight.  He was admitted into the hospital on 5/23- 526 for shortness of breath found to be COVID-19 positive at that time.  Repeat CT angiogram of the chest on 5/20, was of poor quality and.  Showed multiple opacities concerning for viral pneumonia, but no clear signs of PE.  Denies having any cough or shortness of breath or fever since that time.  ED Course: On admission to the emergency department patient was seen to be afebrile, respirations 11-24, blood pressure elevated to 168/92, and O2 saturation maintained on room air.  Labs revealed WBC 11.6 with low MCV, platelets 413, INR 1.9, and CMP within normal limits.  No imaging studies were performed.  TRH called to admit into the hospital.  Assessment & Plan:   Principal Problem:   Rectal bleeding Active Problems:   Acute pulmonary embolism (HCC)   Diabetes mellitus type 2 in obese (HCC)   PAF (paroxysmal atrial fibrillation) (HCC)   Hematochezia, hypochromic anemia: Patient  presents with reports of hematochezia.   Recurrent episode 6/4 AM per nursing  Anticoagulation placed on hold Type and screen Clear liquid diet -> he's asking for normal diet, will switch this for today He continues to have bloody stools, but less, Hb is stable, will continue to monitor off anticoagulation Discussed with GI on 6/4 - recommended clear liquid diet and consider bleeding scan if recurrent bleeding.  Per GI, at this point in time, due to recent covid + status and hemodynamic stability, no plans for colonoscopy at this time.  Will hold anticoagulation and consider rechallenging in next few days.  Could consider IVC filter if he fails.   Recent history of PE: Patient diagnosed with a pulmonary embolus on 5/4 where INR was subtherapetic.  Repeat CT angiogram of the chest a poor quality on 5/20 when he came in positive for COVID, but no acute pulmonary embolus is appreciated.  Patient's primary had tried to start him on Xarelto to reduce need for INR checks. - holding lovenox due to bleeding above - ultimately warfarin probably best option due to morbid obesity  - repeat US prelim without dvt seen (follow final)  Recent COVID 19 infection: pt now testing negative. He tells me that this is actually his 2nd negative test (I don't see this in system though). He notes he's been asx for 7+ days.  Pt had flag for positive COVID test, but as he's been asymptomatic for many days and now has negative test, will remove this.  Paroxysmal atrial fibrillation: Patient appears to be in sinus rhythm at this time. CHA2DS2-VASc score = 3. - Continue Labetalol  Subtherapeutic INR: INR 1.9 on admission.   - Holding anticoagulation due to above  Essential Hypertension: stable. - Continue labetalol, amlodipine, lisinopril  Abdominal pain: Right lower quadrant abdominal pain appears secondary to Lovenox injections. - Continue to monitor  Diabetes Mellitus type 2: Uncontrolled. HbgA1c 7.6 -  Hypoglycemic protocol - hold metformin - CBG q AC with Moderate SSI  Acute Kidney Injury: follow, may need to hold lisinopril/spironolactone.   Morbid obesity: BMI 55 kg/m.  DVT prophylaxis: SCD Code Status: full  Family Communication: none at bedside Disposition Plan: pending.  Pt still with bloody stool, but seems like it is improving.  Continue to monitor inpatient.    Consultants:   GI  Procedures:   none  Antimicrobials: Anti-infectives (From admission, onward)   None     Subjective: Asking for diet. Bleeding improved, less, but still with some bloody stools.  Objective: Vitals:   02/21/19 1510 02/21/19 2117 02/22/19 0358 02/22/19 1533  BP: 111/85 123/72 101/74 126/80  Pulse: 84 85 87 85  Resp: 15 18 14 17   Temp: 98.3 F (36.8 C) 98.3 F (36.8 C) 98.2 F (36.8 C) 98.6 F (37 C)  TempSrc: Oral Oral Oral Oral  SpO2: 97% 96% 97% 96%  Weight:      Height:        Intake/Output Summary (Last 24 hours) at 02/22/2019 1550 Last data filed at 02/22/2019 0500 Gross per 24 hour  Intake 300 ml  Output -  Net 300 ml   Filed Weights   02/20/19 1226  Weight: (!) 179.2 kg    Examination:  General: No acute distress. Cardiovascular: Heart sounds show a regular rate, and rhythm Lungs: Clear to auscultation bilaterally  Abdomen: Soft, nontender, nondistended with normal active bowel sounds. No masses. No hepatosplenomegaly. Neurological: Alert and oriented 3. Moves all extremities 4. Cranial nerves II through XII grossly intact. Skin: Warm and dry. No rashes or lesions. Extremities: bilateral LE edema Psychiatric: Mood and affect are normal. Insight and judgment are appropriate.   Data Reviewed: I have personally reviewed following labs and imaging studies  CBC: Recent Labs  Lab 02/20/19 1226  02/21/19 0412 02/21/19 1053 02/21/19 1523 02/21/19 2127 02/22/19 0354  WBC 7.9  --  7.8  --   --   --  7.5  NEUTROABS 3.8  --   --   --   --   --   --   HGB  11.6*   < > 11.8* 11.9* 11.6* 11.4* 11.3*  HCT 39.4   < > 39.4 39.2 38.7* 37.7* 37.9*  MCV 80.4  --  79.9*  --   --   --  80.0  PLT 413*  --  367  --   --   --  383   < > = values in this interval not displayed.   Basic Metabolic Panel: Recent Labs  Lab 02/20/19 1226 02/21/19 0412 02/22/19 0354  NA 138 137 137  K 4.2 4.0 3.8  CL 101 100 99  CO2 30 28 28   GLUCOSE 77 120* 149*  BUN 9 10 11   CREATININE 1.07 1.16 1.46*  CALCIUM 9.1 8.9 8.7*   GFR: Estimated Creatinine Clearance: 96.7 mL/min (A) (by C-G formula based on SCr of 1.46 mg/dL (H)). Liver Function Tests: Recent Labs  Lab 02/20/19 1226 02/22/19 0354  AST 20 20  ALT 24 23  ALKPHOS  42 42  BILITOT 0.5 0.6  PROT 7.4 7.2  ALBUMIN 3.5 3.4*   Recent Labs  Lab 02/20/19 1801  LIPASE 23   No results for input(s): AMMONIA in the last 168 hours. Coagulation Profile: Recent Labs  Lab 02/20/19 1226  INR 1.9*   Cardiac Enzymes: No results for input(s): CKTOTAL, CKMB, CKMBINDEX, TROPONINI in the last 168 hours. BNP (last 3 results) No results for input(s): PROBNP in the last 8760 hours. HbA1C: No results for input(s): HGBA1C in the last 72 hours. CBG: Recent Labs  Lab 02/21/19 1247 02/21/19 1634 02/21/19 2118 02/22/19 0807 02/22/19 1221  GLUCAP 97 118* 112* 127* 122*   Lipid Profile: No results for input(s): CHOL, HDL, LDLCALC, TRIG, CHOLHDL, LDLDIRECT in the last 72 hours. Thyroid Function Tests: No results for input(s): TSH, T4TOTAL, FREET4, T3FREE, THYROIDAB in the last 72 hours. Anemia Panel: No results for input(s): VITAMINB12, FOLATE, FERRITIN, TIBC, IRON, RETICCTPCT in the last 72 hours. Sepsis Labs: No results for input(s): PROCALCITON, LATICACIDVEN in the last 168 hours.  Recent Results (from the past 240 hour(s))  SARS Coronavirus 2     Status: None   Collection Time: 02/20/19  2:36 PM  Result Value Ref Range Status   SARS Coronavirus 2 NOT DETECTED NOT DETECTED Final    Comment: (NOTE)  SARS-CoV-2 target nucleic acids are NOT DETECTED. The SARS-CoV-2 RNA is generally detectable in upper and lower respiratory specimens during the acute phase of infection.  Negative  results do not preclude SARS-CoV-2 infection, do not rule out co-infections with other pathogens, and should not be used as the sole basis for treatment or other patient management decisions.  Negative results must be combined with clinical observations, patient history, and epidemiological information. The expected result is Not Detected. Fact Sheet for Patients: http://www.biofiredefense.com/wp-content/uploads/2020/03/BIOFIRE-COVID -19-patients.pdf Fact Sheet for Healthcare Providers: http://www.biofiredefense.com/wp-content/uploads/2020/03/BIOFIRE-COVID -19-hcp.pdf This test is not yet approved or cleared by the [place] and  has been authorized for detection and/or diagnosis of SARS-CoV-2 by FDA under an Emergency Use Authorization (EUA).  This EUA will remain in effec t (meaning this test can be used) for the duration of  the COVID-19 declaration under Section 564(b)(1) of the Act, 21 U.S.C. section 360bbb-3(b)(1), unless the authorization is terminated or revoked sooner. Performed at [facility], [address], [place], [place] [address]          Radiology Studies: Vas [place] Lower Extremity Venous (dvt)  Result Date: 02/22/2019  Lower Venous Study Indications: Swelling.  Limitations: Body habitus and poor ultrasound/tissue interface. Performing Technologist: Chanda Busing RVT  Examination Guidelines: A complete evaluation includes B-mode imaging, spectral Doppler, color Doppler, and power Doppler as needed of all accessible portions of each vessel. Bilateral testing is considered an integral part of a complete examination. Limited examinations for reoccurring indications may be performed as noted.  +---------+---------------+---------+-----------+----------+-------+ RIGHT     CompressibilityPhasicitySpontaneityPropertiesSummary +---------+---------------+---------+-----------+----------+-------+ CFV      Full           Yes      Yes                          +---------+---------------+---------+-----------+----------+-------+ SFJ      Full                                                 +---------+---------------+---------+-----------+----------+-------+  FV Prox  Full                                                 +---------+---------------+---------+-----------+----------+-------+ FV Mid                  Yes      Yes                          +---------+---------------+---------+-----------+----------+-------+ FV Distal               Yes      Yes                          +---------+---------------+---------+-----------+----------+-------+ PFV      Full                                                 +---------+---------------+---------+-----------+----------+-------+ POP      Full           Yes      Yes                          +---------+---------------+---------+-----------+----------+-------+ PTV      Full                                                 +---------+---------------+---------+-----------+----------+-------+ PERO     Full                                                 +---------+---------------+---------+-----------+----------+-------+   +---------+---------------+---------+-----------+----------+--------------+ LEFT     CompressibilityPhasicitySpontaneityPropertiesSummary        +---------+---------------+---------+-----------+----------+--------------+ CFV      Full           Yes      Yes                                 +---------+---------------+---------+-----------+----------+--------------+ SFJ      Full                                                        +---------+---------------+---------+-----------+----------+--------------+ FV Prox  Full                                                         +---------+---------------+---------+-----------+----------+--------------+ FV Mid                  Yes      Yes                                 +---------+---------------+---------+-----------+----------+--------------+  FV Distal                                             Not visualized +---------+---------------+---------+-----------+----------+--------------+ PFV      Full                                                        +---------+---------------+---------+-----------+----------+--------------+ POP      Full           Yes      Yes                                 +---------+---------------+---------+-----------+----------+--------------+ PTV      Full                                                        +---------+---------------+---------+-----------+----------+--------------+ PERO                                                  Not visualized +---------+---------------+---------+-----------+----------+--------------+     Summary: Right: There is no evidence of deep vein thrombosis in the lower extremity. However, portions of this examination were limited- see technologist comments above. No cystic structure found in the popliteal fossa. Left: There is no evidence of deep vein thrombosis in the lower extremity. However, portions of this examination were limited- see technologist comments above. No cystic structure found in the popliteal fossa.  *See table(s) above for measurements and observations.    Preliminary         Scheduled Meds: . amLODipine  10 mg Oral Daily  . atorvastatin  40 mg Oral QHS  . furosemide  80 mg Oral TID  . insulin aspart  0-15 Units Subcutaneous TID WC  . insulin aspart  0-5 Units Subcutaneous QHS  . labetalol  200 mg Oral Daily  . lisinopril  40 mg Oral Daily  . pantoprazole  40 mg Oral Daily  . spironolactone  50 mg Oral Daily  . triamcinolone cream  1 application Topical TID    Continuous Infusions:   LOS: 1 day    Time spent: over 30 min    Lacretia Nicks, MD [facility] Pager AMION  If 7PM-7AM, please contact night-coverage [email or web address] Password [email or web address] 02/22/2019, 3:50 PM

## 2019-02-22 NOTE — Progress Notes (Signed)
Hemoglobin stable no stools reported please call my weekend coverage partner if questions or problems this weekend and if signs of rebleeding with resumption of anticoagulation please reconsult for consideration of colonoscopy

## 2019-02-23 ENCOUNTER — Inpatient Hospital Stay (HOSPITAL_COMMUNITY): Payer: Medicaid Other

## 2019-02-23 LAB — CBC
HCT: 36 % — ABNORMAL LOW (ref 39.0–52.0)
Hemoglobin: 10.8 g/dL — ABNORMAL LOW (ref 13.0–17.0)
MCH: 24.1 pg — ABNORMAL LOW (ref 26.0–34.0)
MCHC: 30 g/dL (ref 30.0–36.0)
MCV: 80.2 fL (ref 80.0–100.0)
Platelets: 387 10*3/uL (ref 150–400)
RBC: 4.49 MIL/uL (ref 4.22–5.81)
RDW: 15.5 % (ref 11.5–15.5)
WBC: 7.4 10*3/uL (ref 4.0–10.5)
nRBC: 0 % (ref 0.0–0.2)

## 2019-02-23 LAB — COMPREHENSIVE METABOLIC PANEL
ALT: 22 U/L (ref 0–44)
AST: 19 U/L (ref 15–41)
Albumin: 3.5 g/dL (ref 3.5–5.0)
Alkaline Phosphatase: 48 U/L (ref 38–126)
Anion gap: 9 (ref 5–15)
BUN: 12 mg/dL (ref 6–20)
CO2: 29 mmol/L (ref 22–32)
Calcium: 8.6 mg/dL — ABNORMAL LOW (ref 8.9–10.3)
Chloride: 101 mmol/L (ref 98–111)
Creatinine, Ser: 1.3 mg/dL — ABNORMAL HIGH (ref 0.61–1.24)
GFR calc Af Amer: >60 mL/min (ref >60)
GFR calc non Af Amer: >60 mL/min (ref >60)
Glucose, Bld: 127 mg/dL — ABNORMAL HIGH (ref 70–99)
Potassium: 3.7 mmol/L (ref 3.5–5.1)
Sodium: 139 mmol/L (ref 135–145)
Total Bilirubin: 0.5 mg/dL (ref 0.3–1.2)
Total Protein: 7.5 g/dL (ref 6.5–8.1)

## 2019-02-23 LAB — GLUCOSE, CAPILLARY
Glucose-Capillary: 105 mg/dL — ABNORMAL HIGH (ref 70–99)
Glucose-Capillary: 121 mg/dL — ABNORMAL HIGH (ref 70–99)
Glucose-Capillary: 91 mg/dL (ref 70–99)
Glucose-Capillary: 96 mg/dL (ref 70–99)

## 2019-02-23 LAB — MAGNESIUM: Magnesium: 1.8 mg/dL (ref 1.7–2.4)

## 2019-02-23 LAB — HEMOGLOBIN AND HEMATOCRIT, BLOOD
HCT: 35.5 % — ABNORMAL LOW (ref 39.0–52.0)
Hemoglobin: 10.8 g/dL — ABNORMAL LOW (ref 13.0–17.0)

## 2019-02-23 MED ORDER — TECHNETIUM TC 99M-LABELED RED BLOOD CELLS IV KIT
21.2000 | PACK | Freq: Once | INTRAVENOUS | Status: AC | PRN
  Administered 2019-02-23: 20:00:00 21.2 via INTRAVENOUS

## 2019-02-23 NOTE — Progress Notes (Signed)
Patient has been refusing CPAP. 

## 2019-02-23 NOTE — Progress Notes (Signed)
PROGRESS NOTE    Benjamin [name]  ZOX:[id number] DOB: 1966/03/09 DOA: 02/20/2019 PCP: Claiborne RiggFleming, [name] W, NP   Brief Narrative:  HPI: Benjamin [name] is Benjamin [name] 53 y.o. male with medical history significant of PAF on Coumadin, DM type II, hypertension, morbid obesity, and pulmonary embolus on 5/4; who presents with reports of blood mixed in his stools.  Blood was bright red in color and filled the toilet bowl.  He reports having 5 similar bowel movements since onset of symptoms.  Associated symptoms include right lower quadrant abdominal pain and soreness.  States he was changed by his primary care provider to Xarelto due to his recent COVID-19 status to reduce need of INR checked, but just received Tera Pellicane prescription today in the mail.  Admitted into the hospital from 5/4-5/7;  diagnosed with bilateral pulmonary embolism where his INR was found to be subtherapeutic.  It was discussed changing him to to 1 of the newer agents, but patient not Keo Schirmer good candidate due to his weight.  He was admitted into the hospital on 5/23- 526 for shortness of breath found to be COVID-19 positive at that time.  Repeat CT angiogram of the chest on 5/20, was of poor quality and.  Showed multiple opacities concerning for viral pneumonia, but no clear signs of PE.  Denies having any cough or shortness of breath or fever since that time.  ED Course: On admission to the emergency department patient was seen to be afebrile, respirations 11-24, blood pressure elevated to 168/92, and O2 saturation maintained on room air.  Labs revealed WBC 11.6 with low MCV, platelets 413, INR 1.9, and CMP within normal limits.  No imaging studies were performed.  TRH called to admit into the hospital.  Assessment & Plan:   Principal Problem:   Rectal bleeding Active Problems:   Acute pulmonary embolism (HCC)   Diabetes mellitus type 2 in obese (HCC)   PAF (paroxysmal atrial fibrillation) (HCC)   Hematochezia, hypochromic anemia: Patient  presents with reports of hematochezia.   Recurrent episode 6/4 AM per nursing  Anticoagulation placed on hold Type and screen Clear liquid diet -> he's asking for normal diet, will switch this for today He continues to have bloody stools. Will discuss again with GI. Hb slightly downtrended Discussed with GI on 6/4 - recommended clear liquid diet and consider bleeding scan if recurrent bleeding.  Per GI, at this point in time, due to recent covid + status and hemodynamic stability, no plans for colonoscopy at this time.  Will hold anticoagulation and consider rechallenging in next few days.  Could consider IVC filter if he fails.   Recent history of PE: Patient diagnosed with Singleton Hickox pulmonary embolus on 5/4 where INR was subtherapetic.  Repeat CT angiogram of the chest Kazzandra Desaulniers poor quality on 5/20 when he came in positive for COVID, but no acute pulmonary embolus is appreciated.  Patient's primary had tried to start him on Xarelto to reduce need for INR checks. - holding lovenox due to bleeding above - ultimately warfarin probably best option due to morbid obesity  - repeat US without dvt seen   Recent COVID 19 infection: pt now testing negative. He tells me that this is actually his 2nd negative test (I don't see this in system though). He notes he's been asx for 7+ days.    Paroxysmal atrial fibrillation: Patient appears to be in sinus rhythm at this time. CHA2DS2-VASc score = 3. - Continue Labetalol  Subtherapeutic INR: INR 1.9 on admission.   -  Holding anticoagulation due to above  Essential Hypertension: stable. - Continue labetalol, amlodipine, lisinopril  Abdominal pain: Right lower quadrant abdominal pain appears secondary to Lovenox injections. - Continue to monitor  Diabetes Mellitus type 2: Uncontrolled. HbgA1c 7.6 - Hypoglycemic protocol - hold metformin - CBG q AC with Moderate SSI  Acute Kidney Injury: follow, may need to hold lisinopril/spironolactone.   Morbid obesity:  BMI 55 kg/m.  DVT prophylaxis: SCD Code Status: full  Family Communication: none at bedside Disposition Plan: pending.  Pt still with bloody stool, but seems like it is improving.  Continue to monitor inpatient.    Consultants:   GI  Procedures:   none  Antimicrobials: Anti-infectives (From admission, onward)   None     Subjective: No complaints today. No BM since yesterday  Objective: Vitals:   02/22/19 2100 02/22/19 2102 02/23/19 0537 02/23/19 1417  BP: (!) 89/66 (!) 109/53 (!) 154/91 (!) 145/67  Pulse: 91 87 86 88  Resp:   18 19  Temp: 98.2 F (36.8 C)  98.3 F (36.8 C) 98.2 F (36.8 C)  TempSrc: Oral  Oral Oral  SpO2: 96% 96% 98% 93%  Weight:      Height:        Intake/Output Summary (Last 24 hours) at 02/23/2019 1506 Last data filed at 02/23/2019 1421 Gross per 24 hour  Intake 1962 ml  Output -  Net 1962 ml   Filed Weights   02/20/19 1226  Weight: (!) 179.2 kg    Examination:  General: No acute distress. Cardiovascular: Heart sounds show Toneshia Coello regular rate, and rhythm. Lungs: Clear to auscultation bilaterally  Abdomen: Soft, nontender, nondistended Neurological: Alert and oriented 3. Moves all extremities 4. Cranial nerves II through XII grossly intact. Skin: Warm and dry. No rashes or lesions. Extremities: bilateral LE edema Psychiatric: Mood and affect are normal. Insight and judgment are appropriate.   Data Reviewed: I have personally reviewed following labs and imaging studies  CBC: Recent Labs  Lab 02/20/19 1226  02/21/19 0412  02/21/19 2127 02/22/19 0354 02/22/19 1539 02/22/19 2137 02/23/19 0357  WBC 7.9  --  7.8  --   --  7.5  --   --  7.4  NEUTROABS 3.8  --   --   --   --   --   --   --   --   HGB 11.6*   < > 11.8*   < > 11.4* 11.3* 10.9* 10.9* 10.8*  HCT 39.4   < > 39.4   < > 37.7* 37.9* 36.1* 35.5* 36.0*  MCV 80.4  --  79.9*  --   --  80.0  --   --  80.2  PLT 413*  --  367  --   --  383  --   --  387   < > = values in this  interval not displayed.   Basic Metabolic Panel: Recent Labs  Lab 02/20/19 1226 02/21/19 0412 02/22/19 0354 02/23/19 0357  NA 138 137 137 139  K 4.2 4.0 3.8 3.7  CL 101 100 99 101  CO2 30 28 28 29   GLUCOSE 77 120* 149* 127*  BUN 9 10 11 12   CREATININE 1.07 1.16 1.46* 1.30*  CALCIUM 9.1 8.9 8.7* 8.6*  MG  --   --   --  1.8   GFR: Estimated Creatinine Clearance: 108.7 mL/min (Darl Kuss) (by C-G formula based on SCr of 1.3 mg/dL (H)). Liver Function Tests: Recent Labs  Lab 02/20/19 1226 02/22/19 0354  02/23/19 0357  AST 20 20 19   ALT 24 23 22   ALKPHOS 42 42 48  BILITOT 0.5 0.6 0.5  PROT 7.4 7.2 7.5  ALBUMIN 3.5 3.4* 3.5   Recent Labs  Lab 02/20/19 1801  LIPASE 23   No results for input(s): AMMONIA in the last 168 hours. Coagulation Profile: Recent Labs  Lab 02/20/19 1226  INR 1.9*   Cardiac Enzymes: No results for input(s): CKTOTAL, CKMB, CKMBINDEX, TROPONINI in the last 168 hours. BNP (last 3 results) No results for input(s): PROBNP in the last 8760 hours. HbA1C: No results for input(s): HGBA1C in the last 72 hours. CBG: Recent Labs  Lab 02/22/19 1221 02/22/19 1753 02/22/19 2056 02/23/19 0744 02/23/19 1204  GLUCAP 122* 99 111* 105* 121*   Lipid Profile: No results for input(s): CHOL, HDL, LDLCALC, TRIG, CHOLHDL, LDLDIRECT in the last 72 hours. Thyroid Function Tests: No results for input(s): TSH, T4TOTAL, FREET4, T3FREE, THYROIDAB in the last 72 hours. Anemia Panel: No results for input(s): VITAMINB12, FOLATE, FERRITIN, TIBC, IRON, RETICCTPCT in the last 72 hours. Sepsis Labs: No results for input(s): PROCALCITON, LATICACIDVEN in the last 168 hours.  Recent Results (from the past 240 hour(s))  SARS Coronavirus 2     Status: None   Collection Time: 02/20/19  2:36 PM  Result Value Ref Range Status   SARS Coronavirus 2 NOT DETECTED NOT DETECTED Final    Comment: (NOTE) SARS-CoV-2 target nucleic acids are NOT DETECTED. The SARS-CoV-2 RNA is generally  detectable in upper and lower respiratory specimens during the acute phase of infection.  Negative  results do not preclude SARS-CoV-2 infection, do not rule out co-infections with other pathogens, and should not be used as the sole basis for treatment or other patient management decisions.  Negative results must be combined with clinical observations, patient history, and epidemiological information. The expected result is Not Detected. Fact Sheet for Patients: http://www.biofiredefense.com/wp-content/uploads/2020/03/BIOFIRE-COVID -19-patients.pdf Fact Sheet for Healthcare Providers: http://www.biofiredefense.com/wp-content/uploads/2020/03/BIOFIRE-COVID -19-hcp.pdf This test is not yet approved or cleared by the [place] FDA and  has been authorized for detection and/or diagnosis of SARS-CoV-2 by FDA under an Emergency Use Authorization (EUA).  This EUA will remain in effec t (meaning this test can be used) for the duration of  the COVID-19 declaration under Section 564(b)(1) of the Act, 21 U.S.C. section 360bbb-3(b)(1), unless the authorization is terminated or revoked sooner. Performed at [facility], [address] [place], [place] [address]          Radiology Studies: Vas Koreas Lower Extremity Venous (dvt)  Result Date: 02/22/2019  Lower Venous Study Indications: Swelling.  Limitations: Body habitus and poor ultrasound/tissue interface. Performing Technologist: Chanda BusingGregory Collins RVT  Examination Guidelines: Tenecia Ignasiak complete evaluation includes B-mode imaging, spectral Doppler, color Doppler, and power Doppler as needed of all accessible portions of each vessel. Bilateral testing is considered an integral part of Lyllian Gause complete examination. Limited examinations for reoccurring indications may be performed as noted.  +---------+---------------+---------+-----------+----------+-------+ RIGHT    CompressibilityPhasicitySpontaneityPropertiesSummary  +---------+---------------+---------+-----------+----------+-------+ CFV      Full           Yes      Yes                          +---------+---------------+---------+-----------+----------+-------+ SFJ      Full                                                 +---------+---------------+---------+-----------+----------+-------+  FV Prox  Full                                                 +---------+---------------+---------+-----------+----------+-------+ FV Mid                  Yes      Yes                          +---------+---------------+---------+-----------+----------+-------+ FV Distal               Yes      Yes                          +---------+---------------+---------+-----------+----------+-------+ PFV      Full                                                 +---------+---------------+---------+-----------+----------+-------+ POP      Full           Yes      Yes                          +---------+---------------+---------+-----------+----------+-------+ PTV      Full                                                 +---------+---------------+---------+-----------+----------+-------+ PERO     Full                                                 +---------+---------------+---------+-----------+----------+-------+   +---------+---------------+---------+-----------+----------+--------------+ LEFT     CompressibilityPhasicitySpontaneityPropertiesSummary        +---------+---------------+---------+-----------+----------+--------------+ CFV      Full           Yes      Yes                                 +---------+---------------+---------+-----------+----------+--------------+ SFJ      Full                                                        +---------+---------------+---------+-----------+----------+--------------+ FV Prox  Full                                                         +---------+---------------+---------+-----------+----------+--------------+ FV Mid                  Yes      Yes                                 +---------+---------------+---------+-----------+----------+--------------+  FV Distal                                             Not visualized +---------+---------------+---------+-----------+----------+--------------+ PFV      Full                                                        +---------+---------------+---------+-----------+----------+--------------+ POP      Full           Yes      Yes                                 +---------+---------------+---------+-----------+----------+--------------+ PTV      Full                                                        +---------+---------------+---------+-----------+----------+--------------+ PERO                                                  Not visualized +---------+---------------+---------+-----------+----------+--------------+     Summary: Right: There is no evidence of deep vein thrombosis in the lower extremity. However, portions of this examination were limited- see technologist comments above. No cystic structure found in the popliteal fossa. Left: There is no evidence of deep vein thrombosis in the lower extremity. However, portions of this examination were limited- see technologist comments above. No cystic structure found in the popliteal fossa.  *See table(s) above for measurements and observations. Electronically signed by Lemar Livings MD on 02/22/2019 at 4:26:49 PM.    Final         Scheduled Meds: . amLODipine  10 mg Oral Daily  . atorvastatin  40 mg Oral QHS  . furosemide  80 mg Oral TID  . insulin aspart  0-15 Units Subcutaneous TID WC  . insulin aspart  0-5 Units Subcutaneous QHS  . labetalol  200 mg Oral Daily  . lisinopril  40 mg Oral Daily  . pantoprazole  40 mg Oral Daily  . spironolactone  50 mg Oral Daily  . triamcinolone cream  1  application Topical TID   Continuous Infusions:   LOS: 2 days    Time spent: over 30 min    Lacretia Nicks, MD [facility] Pager [email or web address]  If 7PM-7AM, please contact night-coverage [email or web address] Password [email or web address] 02/23/2019, 3:06 PM

## 2019-02-24 LAB — CBC
HCT: 35.1 % — ABNORMAL LOW (ref 39.0–52.0)
Hemoglobin: 10.6 g/dL — ABNORMAL LOW (ref 13.0–17.0)
MCH: 24.3 pg — ABNORMAL LOW (ref 26.0–34.0)
MCHC: 30.2 g/dL (ref 30.0–36.0)
MCV: 80.5 fL (ref 80.0–100.0)
Platelets: 350 10*3/uL (ref 150–400)
RBC: 4.36 MIL/uL (ref 4.22–5.81)
RDW: 15.6 % — ABNORMAL HIGH (ref 11.5–15.5)
WBC: 8.3 10*3/uL (ref 4.0–10.5)
nRBC: 0 % (ref 0.0–0.2)

## 2019-02-24 LAB — COMPREHENSIVE METABOLIC PANEL
ALT: 21 U/L (ref 0–44)
AST: 18 U/L (ref 15–41)
Albumin: 3.4 g/dL — ABNORMAL LOW (ref 3.5–5.0)
Alkaline Phosphatase: 48 U/L (ref 38–126)
Anion gap: 9 (ref 5–15)
BUN: 13 mg/dL (ref 6–20)
CO2: 29 mmol/L (ref 22–32)
Calcium: 8.9 mg/dL (ref 8.9–10.3)
Chloride: 100 mmol/L (ref 98–111)
Creatinine, Ser: 1.22 mg/dL (ref 0.61–1.24)
GFR calc Af Amer: >60 mL/min (ref >60)
GFR calc non Af Amer: >60 mL/min (ref >60)
Glucose, Bld: 111 mg/dL — ABNORMAL HIGH (ref 70–99)
Potassium: 3.8 mmol/L (ref 3.5–5.1)
Sodium: 138 mmol/L (ref 135–145)
Total Bilirubin: 0.3 mg/dL (ref 0.3–1.2)
Total Protein: 7.4 g/dL (ref 6.5–8.1)

## 2019-02-24 LAB — HEMOGLOBIN AND HEMATOCRIT, BLOOD
HCT: 35.7 % — ABNORMAL LOW (ref 39.0–52.0)
HCT: 35.3 % — ABNORMAL LOW (ref 39.0–52.0)
Hemoglobin: 10.7 g/dL — ABNORMAL LOW (ref 13.0–17.0)
Hemoglobin: 10.6 g/dL — ABNORMAL LOW (ref 13.0–17.0)

## 2019-02-24 LAB — GLUCOSE, CAPILLARY
Glucose-Capillary: 130 mg/dL — ABNORMAL HIGH (ref 70–99)
Glucose-Capillary: 135 mg/dL — ABNORMAL HIGH (ref 70–99)
Glucose-Capillary: 145 mg/dL — ABNORMAL HIGH (ref 70–99)
Glucose-Capillary: 82 mg/dL (ref 70–99)

## 2019-02-24 LAB — PROTIME-INR
INR: 1.2 (ref 0.8–1.2)
Prothrombin Time: 14.6 seconds (ref 11.4–15.2)

## 2019-02-24 LAB — MAGNESIUM: Magnesium: 1.7 mg/dL (ref 1.7–2.4)

## 2019-02-24 MED ORDER — SODIUM CHLORIDE 0.9 % IV SOLN: INTRAVENOUS | Status: DC

## 2019-02-24 MED ORDER — PEG 3350-KCL-NABCB-NACL-NASULF 236 G PO SOLR
4000.0000 mL | Freq: Once | ORAL | Status: AC
  Administered 2019-02-24: 4000 mL via ORAL
  Filled 2019-02-24: qty 4000

## 2019-02-24 MED ORDER — BISACODYL 10 MG RE SUPP: 10.0000 mg | Freq: Once | RECTAL | Status: DC

## 2019-02-24 NOTE — Progress Notes (Signed)
PROGRESS NOTE    Benjamin [name]  ZOX:[id number] DOB: [birth year] DOA: 02/20/2019 PCP: Claiborne RiggFleming, [name] W, NP   Brief Narrative:  HPI: Benjamin [name] is Benjamin [name] 53 y.o. male with medical history significant of PAF on Coumadin, DM type II, hypertension, morbid obesity, and pulmonary embolus on 5/4; who presents with reports of blood mixed in his stools.  Blood was bright red in color and filled the toilet bowl.  He reports having 5 similar bowel movements since onset of symptoms.  Associated symptoms include right lower quadrant abdominal pain and soreness.  States he was changed by his primary care provider to Xarelto due to his recent COVID-19 status to reduce need of INR checked, but just received Grisela Mesch prescription today in the mail.  Admitted into the hospital from 5/4-5/7;  diagnosed with bilateral pulmonary embolism where his INR was found to be subtherapeutic.  It was discussed changing him to to 1 of the newer agents, but patient not Jaxen Samples good candidate due to his weight.  He was admitted into the hospital on 5/23- 526 for shortness of breath found to be COVID-19 positive at that time.  Repeat CT angiogram of the chest on 5/20, was of poor quality and.  Showed multiple opacities concerning for viral pneumonia, but no clear signs of PE.  Denies having any cough or shortness of breath or fever since that time.  ED Course: On admission to the emergency department patient was seen to be afebrile, respirations 11-24, blood pressure elevated to 168/92, and O2 saturation maintained on room air.  Labs revealed WBC 11.6 with low MCV, platelets 413, INR 1.9, and CMP within normal limits.  No imaging studies were performed.  TRH called to admit into the hospital.  Assessment & Plan:   Principal Problem:   Rectal bleeding Active Problems:   Acute pulmonary embolism (HCC)   Diabetes mellitus type 2 in obese (HCC)   PAF (paroxysmal atrial fibrillation) (HCC)   Hematochezia, hypochromic anemia: Patient  presents with reports of hematochezia.   Recurrent episode 6/4 AM per nursing  Anticoagulation placed on hold Type and screen Clear liquid diet -> he's asking for normal diet, will switch this for today He continues to have bloody stools. Appreciate GI recs -> plan for endoscopy upper and lower tomorrow 6/8 Hb slightly downtrended  Recent history of PE: Patient diagnosed with [place] pulmonary embolus on 5/4 where INR was subtherapetic.  Repeat CT angiogram of the chest Atalia Litzinger poor quality on 5/20 when he came in positive for COVID, but no acute pulmonary embolus is appreciated.  Patient's primary had tried to start him on Xarelto to reduce need for INR checks. - holding lovenox due to bleeding above - ultimately warfarin probably best option due to morbid obesity  - repeat US without dvt seen   Recent COVID 19 infection: pt now testing negative. He tells me that this is actually his 2nd negative test (I don't see this in system though). He notes he's been asx for 7+ days.    Paroxysmal atrial fibrillation: Patient appears to be in sinus rhythm at this time. CHA2DS2-VASc score = 3. - Continue Labetalol  Subtherapeutic INR: INR 1.9 on admission.   - Holding anticoagulation due to above  Essential Hypertension: stable. - Continue labetalol, amlodipine, lisinopril  Abdominal pain: Right lower quadrant abdominal pain appears secondary to Lovenox injections. - Continue to monitor  Diabetes Mellitus type 2: Uncontrolled. HbgA1c 7.6 - Hypoglycemic protocol - hold metformin - CBG q AC with Moderate  SSI  Acute Kidney Injury: follow, may need to hold lisinopril/spironolactone.   Morbid obesity: BMI 55 kg/m.  DVT prophylaxis: SCD Code Status: full  Family Communication: none at bedside Disposition Plan: pending.  Pt still with bloody stool, but seems like it is improving.  Continue to monitor inpatient.    Consultants:   GI  Procedures:   none  Antimicrobials: Anti-infectives  (From admission, onward)   None     Subjective: Doing well. Continued GI bleeding.  Objective: Vitals:   02/23/19 1417 02/23/19 2218 02/24/19 0433 02/24/19 1401  BP: (!) 145/67 (!) 139/99 (!) 141/95 134/90  Pulse: 88 90 86 83  Resp: 19 18 18 20   Temp: 98.2 F (36.8 C) 98.6 F (37 C) 98.2 F (36.8 C) 98.1 F (36.7 C)  TempSrc: Oral Oral Oral Oral  SpO2: 93% 98% 93% 94%  Weight:      Height:        Intake/Output Summary (Last 24 hours) at 02/24/2019 1520 Last data filed at 02/24/2019 0900 Gross per 24 hour  Intake 240 ml  Output -  Net 240 ml   Filed Weights   02/20/19 1226  Weight: (!) 179.2 kg    Examination:  General: No acute distress. Cardiovascular: Heart sounds show Carman Essick regular rate, and rhythm. Lungs: Clear to auscultation bilaterally  Abdomen: Soft, nontender, nondistended  Neurological: Alert and oriented 3. Moves all extremities 4. Cranial nerves II through XII grossly intact. Skin: Warm and dry. No rashes or lesions. Extremities: No clubbing or cyanosis. No edema.  Psychiatric: Mood and affect are normal. Insight and judgment are appropriate.   Data Reviewed: I have personally reviewed following labs and imaging studies  CBC: Recent Labs  Lab 02/20/19 1226  02/21/19 0412  02/22/19 0354  02/22/19 2137 02/23/19 0357 02/23/19 1847 02/24/19 0405 02/24/19 0855  WBC 7.9  --  7.8  --  7.5  --   --  7.4  --  8.3  --   NEUTROABS 3.8  --   --   --   --   --   --   --   --   --   --   HGB 11.6*   < > 11.8*   < > 11.3*   < > 10.9* 10.8* 10.8* 10.6* 10.7*  HCT 39.4   < > 39.4   < > 37.9*   < > 35.5* 36.0* 35.5* 35.1* 35.7*  MCV 80.4  --  79.9*  --  80.0  --   --  80.2  --  80.5  --   PLT 413*  --  367  --  383  --   --  387  --  350  --    < > = values in this interval not displayed.   Basic Metabolic Panel: Recent Labs  Lab 02/20/19 1226 02/21/19 0412 02/22/19 0354 02/23/19 0357 02/24/19 0405  NA 138 137 137 139 138  K 4.2 4.0 3.8 3.7 3.8  CL  101 100 99 101 100  CO2 30 28 28 29 29   GLUCOSE 77 120* 149* 127* 111*  BUN 9 10 11 12 13   CREATININE 1.07 1.16 1.46* 1.30* 1.22  CALCIUM 9.1 8.9 8.7* 8.6* 8.9  MG  --   --   --  1.8 1.7   GFR: Estimated Creatinine Clearance: 115.8 mL/min (by C-G formula based on SCr of 1.22 mg/dL). Liver Function Tests: Recent Labs  Lab 02/20/19 1226 02/22/19 0354 02/23/19 0357 02/24/19 0405  AST  20 20 19 18   ALT 24 23 22 21   ALKPHOS 42 42 48 48  BILITOT 0.5 0.6 0.5 0.3  PROT 7.4 7.2 7.5 7.4  ALBUMIN 3.5 3.4* 3.5 3.4*   Recent Labs  Lab 02/20/19 1801  LIPASE 23   No results for input(s): AMMONIA in the last 168 hours. Coagulation Profile: Recent Labs  Lab 02/20/19 1226 02/24/19 0405  INR 1.9* 1.2   Cardiac Enzymes: No results for input(s): CKTOTAL, CKMB, CKMBINDEX, TROPONINI in the last 168 hours. BNP (last 3 results) No results for input(s): PROBNP in the last 8760 hours. HbA1C: No results for input(s): HGBA1C in the last 72 hours. CBG: Recent Labs  Lab 02/23/19 1204 02/23/19 1701 02/23/19 2216 02/24/19 0808 02/24/19 1222  GLUCAP 121* 91 96 130* 135*   Lipid Profile: No results for input(s): CHOL, HDL, LDLCALC, TRIG, CHOLHDL, LDLDIRECT in the last 72 hours. Thyroid Function Tests: No results for input(s): TSH, T4TOTAL, FREET4, T3FREE, THYROIDAB in the last 72 hours. Anemia Panel: No results for input(s): VITAMINB12, FOLATE, FERRITIN, TIBC, IRON, RETICCTPCT in the last 72 hours. Sepsis Labs: No results for input(s): PROCALCITON, LATICACIDVEN in the last 168 hours.  Recent Results (from the past 240 hour(s))  SARS Coronavirus 2     Status: None   Collection Time: 02/20/19  2:36 PM  Result Value Ref Range Status   SARS Coronavirus 2 NOT DETECTED NOT DETECTED Final    Comment: (NOTE) SARS-CoV-2 target nucleic acids are NOT DETECTED. The SARS-CoV-2 RNA is generally detectable in upper and lower respiratory specimens during the acute phase of infection.  Negative   results do not preclude SARS-CoV-2 infection, do not rule out co-infections with other pathogens, and should not be used as the sole basis for treatment or other patient management decisions.  Negative results must be combined with clinical observations, patient history, and epidemiological information. The expected result is Not Detected. Fact Sheet for Patients: http://www.biofiredefense.com/wp-content/uploads/2020/03/BIOFIRE-COVID -19-patients.pdf Fact Sheet for Healthcare Providers: http://www.biofiredefense.com/wp-content/uploads/2020/03/BIOFIRE-COVID -19-hcp.pdf This test is not yet approved or cleared by the [place] and  has been authorized for detection and/or diagnosis of SARS-CoV-2 by FDA under an Emergency Use Authorization (EUA).  This EUA will remain in effec t (meaning this test can be used) for the duration of  the COVID-19 declaration under Section 564(b)(1) of the Act, 21 U.S.C. section 360bbb-3(b)(1), unless the authorization is terminated or revoked sooner. Performed at [facility], [place] [address], [place], [place] [address]          Radiology Studies: Nm Gi Blood Loss  Result Date: 02/23/2019 CLINICAL DATA:  Bright red blood per rectum EXAM: NUCLEAR MEDICINE GASTROINTESTINAL BLEEDING SCAN TECHNIQUE: Sequential abdominal images were obtained following intravenous administration of Tc-69m labeled red blood cells. RADIOPHARMACEUTICALS:  21.2 mCi Tc-103m pertechnetate in-vitro labeled red cells. COMPARISON:  None. FINDINGS: Sequential anterior planar scintigraphic images of the abdomen are reviewed through 120 minutes. There is localization of tagged RBCs to blood pool structures and minimal urinary excretion related to free pertechnetate. No evidence of GI lumen localization or peristalsis to suggest nidus of GI bleeding. IMPRESSION: Negative nuclear scintigraphic GI bleeding examination through 120 minutes. No nidus of GI bleeding identified.  Electronically Signed   By: Eddie Candle M.D.   On: 02/23/2019 22:21        Scheduled Meds: . amLODipine  10 mg Oral Daily  . atorvastatin  40 mg Oral QHS  . bisacodyl  10 mg Rectal Once  . furosemide  80  mg Oral TID  . insulin aspart  0-15 Units Subcutaneous TID WC  . insulin aspart  0-5 Units Subcutaneous QHS  . labetalol  200 mg Oral Daily  . lisinopril  40 mg Oral Daily  . pantoprazole  40 mg Oral Daily  . polyethylene glycol  4,000 mL Oral Once  . spironolactone  50 mg Oral Daily  . triamcinolone cream  1 application Topical TID   Continuous Infusions:   LOS: 3 days    Time spent: over 30 min    Lacretia Nicks, MD [facility] Pager AMION  If 7PM-7AM, please contact night-coverage [email or web address] Password [email or web address] 02/24/2019, 3:20 PM

## 2019-02-24 NOTE — Plan of Care (Signed)

## 2019-02-24 NOTE — Progress Notes (Signed)
Subjective: Reports ongoing bleeding (dark blood and red blood) despite off anticoagulants for several days. Reports prior history of what sounds like ulcers in the past with couple prior endoscopies.  Objective: Vital signs in last 24 hours: Temp:  [98.2 F (36.8 C)-98.6 F (37 C)] 98.2 F (36.8 C) (06/07 0433) Pulse Rate:  [86-90] 86 (06/07 0433) Resp:  [18-19] 18 (06/07 0433) BP: (139-145)/(67-99) 141/95 (06/07 0433) SpO2:  [93 %-98 %] 93 % (06/07 0433) Weight change:  Last BM Date: 02/22/19  PE: GEN:  Obese, NAD  Lab Results: CBC    Component Value Date/Time   WBC 8.3 02/24/2019 0405   RBC 4.36 02/24/2019 0405   HGB 10.7 (L) 02/24/2019 0855   HCT 35.7 (L) 02/24/2019 0855   PLT 350 02/24/2019 0405   MCV 80.5 02/24/2019 0405   MCH 24.3 (L) 02/24/2019 0405   MCHC 30.2 02/24/2019 0405   RDW 15.6 (H) 02/24/2019 0405   LYMPHSABS 2.8 02/20/2019 1226   MONOABS 0.9 02/20/2019 1226   EOSABS 0.5 02/20/2019 1226   BASOSABS 0.0 02/20/2019 1226   CMP     Component Value Date/Time   NA 138 02/24/2019 0405   K 3.8 02/24/2019 0405   CL 100 02/24/2019 0405   CO2 29 02/24/2019 0405   GLUCOSE 111 (H) 02/24/2019 0405   BUN 13 02/24/2019 0405   CREATININE 1.22 02/24/2019 0405   CALCIUM 8.9 02/24/2019 0405   PROT 7.4 02/24/2019 0405   ALBUMIN 3.4 (L) 02/24/2019 0405   AST 18 02/24/2019 0405   ALT 21 02/24/2019 0405   ALKPHOS 48 02/24/2019 0405   BILITOT 0.3 02/24/2019 0405   GFRNONAA >60 02/24/2019 0405   GFRAA >60 02/24/2019 0405   Assessment:  1.  GI bleeding, red blood and black stool. Tagged rbc study yesterday negative. 2.  Chronic anticoagulation, history pulmonary embolism, currently on hold (bleeding persists). 3.  Anemia, likely multifactorial. 4.  Obesity. 5.  Reported prior history of ulcers.  Plan:  1.  Endoscopy and colonoscopy tomorrow for further evaluation. 2.  Risks (bleeding, infection, bowel perforation that could require surgery, sedation-related  changes in cardiopulmonary systems), benefits (identification and possible treatment of source of symptoms, exclusion of certain causes of symptoms), and alternatives (watchful waiting, radiographic imaging studies, empiric medical treatment) of upper endoscopy (EGD) were explained to patient/family in detail and patient wishes to proceed. 3.  Risks (bleeding, infection, bowel perforation that could require surgery, sedation-related changes in cardiopulmonary systems), benefits (identification and possible treatment of source of symptoms, exclusion of certain causes of symptoms), and alternatives (watchful waiting, radiographic imaging studies, empiric medical treatment) of colonoscopy were explained to patient/family in detail and patient wishes to proceed. 4.  Next step in management pending endoscopy and colonoscopy findings.   Landry Dyke 02/24/2019, 11:32 AM   Cell [phone] If no answer or after 5 PM call [phone]

## 2019-02-24 NOTE — Progress Notes (Signed)
Bowel prep prepared for patient.  Lines marked on jug to specify amount to drink in accordance with the instructions.  Patient verbalizes understanding.  Will continue to monitor.

## 2019-02-25 ENCOUNTER — Inpatient Hospital Stay (HOSPITAL_COMMUNITY): Payer: Medicaid Other | Admitting: Certified Registered Nurse Anesthetist

## 2019-02-25 ENCOUNTER — Encounter (HOSPITAL_COMMUNITY): Payer: Self-pay | Admitting: *Deleted

## 2019-02-25 ENCOUNTER — Encounter (HOSPITAL_COMMUNITY)
Admission: EM | Disposition: A | Discharge status: Home / Self Care | Payer: Self-pay | Source: Home / Self Care | Attending: Family Medicine

## 2019-02-25 HISTORY — PX: ESOPHAGOGASTRODUODENOSCOPY (EGD) WITH PROPOFOL: SHX5813

## 2019-02-25 HISTORY — PX: COLONOSCOPY WITH PROPOFOL: SHX5780

## 2019-02-25 LAB — HEMOGLOBIN AND HEMATOCRIT, BLOOD
HCT: 34.7 % — ABNORMAL LOW (ref 39.0–52.0)
Hemoglobin: 10.3 g/dL — ABNORMAL LOW (ref 13.0–17.0)

## 2019-02-25 LAB — COMPREHENSIVE METABOLIC PANEL
ALT: 21 U/L (ref 0–44)
AST: 20 U/L (ref 15–41)
Albumin: 3.4 g/dL — ABNORMAL LOW (ref 3.5–5.0)
Alkaline Phosphatase: 45 U/L (ref 38–126)
Anion gap: 8 (ref 5–15)
BUN: 10 mg/dL (ref 6–20)
CO2: 30 mmol/L (ref 22–32)
Calcium: 8.6 mg/dL — ABNORMAL LOW (ref 8.9–10.3)
Chloride: 100 mmol/L (ref 98–111)
Creatinine, Ser: 1.11 mg/dL (ref 0.61–1.24)
GFR calc Af Amer: >60 mL/min (ref >60)
GFR calc non Af Amer: >60 mL/min (ref >60)
Glucose, Bld: 111 mg/dL — ABNORMAL HIGH (ref 70–99)
Potassium: 3.8 mmol/L (ref 3.5–5.1)
Sodium: 138 mmol/L (ref 135–145)
Total Bilirubin: 0.4 mg/dL (ref 0.3–1.2)
Total Protein: 6.9 g/dL (ref 6.5–8.1)

## 2019-02-25 LAB — GLUCOSE, CAPILLARY
Glucose-Capillary: 104 mg/dL — ABNORMAL HIGH (ref 70–99)
Glucose-Capillary: 95 mg/dL (ref 70–99)
Glucose-Capillary: 129 mg/dL — ABNORMAL HIGH (ref 70–99)
Glucose-Capillary: 176 mg/dL — ABNORMAL HIGH (ref 70–99)

## 2019-02-25 LAB — CBC
HCT: 34.1 % — ABNORMAL LOW (ref 39.0–52.0)
Hemoglobin: 10.1 g/dL — ABNORMAL LOW (ref 13.0–17.0)
MCH: 23.9 pg — ABNORMAL LOW (ref 26.0–34.0)
MCHC: 29.6 g/dL — ABNORMAL LOW (ref 30.0–36.0)
MCV: 80.6 fL (ref 80.0–100.0)
Platelets: 353 10*3/uL (ref 150–400)
RBC: 4.23 MIL/uL (ref 4.22–5.81)
RDW: 15.7 % — ABNORMAL HIGH (ref 11.5–15.5)
WBC: 7.4 10*3/uL (ref 4.0–10.5)
nRBC: 0 % (ref 0.0–0.2)

## 2019-02-25 LAB — MAGNESIUM: Magnesium: 1.6 mg/dL — ABNORMAL LOW (ref 1.7–2.4)

## 2019-02-25 SURGERY — ESOPHAGOGASTRODUODENOSCOPY (EGD) WITH PROPOFOL
Anesthesia: General | Laterality: Left

## 2019-02-25 MED ORDER — ROCURONIUM BROMIDE 100 MG/10ML IV SOLN
INTRAVENOUS | Status: DC | PRN
  Administered 2019-02-25: 60 mg via INTRAVENOUS

## 2019-02-25 MED ORDER — HYDROCORTISONE ACETATE 25 MG RE SUPP
25.0000 mg | Freq: Two times a day (BID) | RECTAL | Status: DC
  Administered 2019-02-25 – 2019-03-01 (×8): 25 mg via RECTAL
  Filled 2019-02-25 (×9): qty 1

## 2019-02-25 MED ORDER — WARFARIN SODIUM 7.5 MG PO TABS: ORAL_TABLET | ORAL | 0 refills | Status: DC

## 2019-02-25 MED ORDER — FERROUS SULFATE 325 (65 FE) MG PO TABS: 325.0000 mg | ORAL_TABLET | Freq: Every day | ORAL | 0 refills | Status: DC

## 2019-02-25 MED ORDER — WARFARIN SODIUM 6 MG PO TABS: ORAL_TABLET | ORAL | 0 refills | Status: DC

## 2019-02-25 MED ORDER — LACTATED RINGERS IV SOLN
INTRAVENOUS | Status: DC
  Administered 2019-02-25: 08:00:00 via INTRAVENOUS

## 2019-02-25 MED ORDER — MAGNESIUM SULFATE 2 GM/50ML IV SOLN
2.0000 g | Freq: Once | INTRAVENOUS | Status: AC
  Administered 2019-02-25: 15:00:00 2 g via INTRAVENOUS
  Filled 2019-02-25: qty 50

## 2019-02-25 MED ORDER — DEXAMETHASONE SODIUM PHOSPHATE 10 MG/ML IJ SOLN
INTRAMUSCULAR | Status: DC | PRN
  Administered 2019-02-25: 5 mg via INTRAVENOUS

## 2019-02-25 MED ORDER — FENTANYL CITRATE (PF) 100 MCG/2ML IJ SOLN
INTRAMUSCULAR | Status: DC | PRN
  Administered 2019-02-25: 25 ug via INTRAVENOUS

## 2019-02-25 MED ORDER — WARFARIN SODIUM 7.5 MG PO TABS
7.5000 mg | ORAL_TABLET | Freq: Once | ORAL | Status: AC
  Administered 2019-02-25: 18:00:00 7.5 mg via ORAL
  Filled 2019-02-25: qty 1

## 2019-02-25 MED ORDER — PHENYLEPHRINE HCL (PRESSORS) 10 MG/ML IV SOLN
INTRAVENOUS | Status: DC | PRN
  Administered 2019-02-25: 120 ug via INTRAVENOUS

## 2019-02-25 MED ORDER — WARFARIN SODIUM 2.5 MG PO TABS
2.5000 mg | ORAL_TABLET | Freq: Once | ORAL | Status: AC
  Administered 2019-02-25: 21:00:00 2.5 mg via ORAL
  Filled 2019-02-25: qty 1

## 2019-02-25 MED ORDER — WARFARIN - PHARMACIST DOSING INPATIENT: Freq: Every day | Status: DC

## 2019-02-25 MED ORDER — SODIUM CHLORIDE 0.9 % IV SOLN
INTRAVENOUS | Status: DC | PRN
  Administered 2019-02-25: 60 ug/min via INTRAVENOUS

## 2019-02-25 MED ORDER — HYDROCORTISONE ACETATE 25 MG RE SUPP: 25.0000 mg | Freq: Two times a day (BID) | RECTAL | 0 refills | Status: AC

## 2019-02-25 MED ORDER — ONDANSETRON HCL 4 MG/2ML IJ SOLN
INTRAMUSCULAR | Status: DC | PRN
  Administered 2019-02-25: 4 mg via INTRAVENOUS

## 2019-02-25 MED ORDER — SUCCINYLCHOLINE CHLORIDE 20 MG/ML IJ SOLN
INTRAMUSCULAR | Status: DC | PRN
  Administered 2019-02-25: 200 mg via INTRAVENOUS

## 2019-02-25 MED ORDER — LIDOCAINE HCL (CARDIAC) PF 100 MG/5ML IV SOSY
PREFILLED_SYRINGE | INTRAVENOUS | Status: DC | PRN
  Administered 2019-02-25: 100 mg via INTRAVENOUS

## 2019-02-25 MED ORDER — SUGAMMADEX SODIUM 200 MG/2ML IV SOLN
INTRAVENOUS | Status: DC | PRN
  Administered 2019-02-25: 800 mg via INTRAVENOUS

## 2019-02-25 MED ORDER — PROPOFOL 10 MG/ML IV BOLUS
INTRAVENOUS | Status: DC | PRN
  Administered 2019-02-25: 200 mg via INTRAVENOUS
  Administered 2019-02-25: 100 mg via INTRAVENOUS

## 2019-02-25 MED ORDER — FENTANYL CITRATE (PF) 100 MCG/2ML IJ SOLN
INTRAMUSCULAR | Status: AC
  Filled 2019-02-25: qty 2

## 2019-02-25 SURGICAL SUPPLY — 25 items

## 2019-02-25 NOTE — Op Note (Signed)
[facility] Patient Name: Benjamin [name] Procedure Date : 02/25/2019 MRN: [id number] Attending MD: Arta Silence , MD Date of Birth: 1966/06/24 CSN: [id number] Age: 53 Admit Type: Inpatient Procedure:                Upper GI endoscopy Indications:              Iron deficiency anemia secondary to chronic blood                            loss, Hematochezia Providers:                Arta Silence, MD, Carlyn Reichert, RN, Elspeth Cho Tech., Technician, Raphael Gibney, CRNA Referring MD:              Medicines:                General Anesthesia Complications:            No immediate complications. Estimated Blood Loss:     Estimated blood loss: none. Procedure:                Pre-Anesthesia Assessment:                           - Prior to the procedure, a History and Physical                            was performed, and patient medications and                            allergies were reviewed. The patient's tolerance of                            previous anesthesia was also reviewed. The risks                            and benefits of the procedure and the sedation                            options and risks were discussed with the patient.                            All questions were answered, and informed consent                            was obtained. Prior Anticoagulants: The patient has                            taken Coumadin (warfarin), last dose was 5 days                            prior to procedure. ASA Grade Assessment: IV - A  patient with severe systemic disease that is a                            constant threat to life. After reviewing the risks                            and benefits, the patient was deemed in                            satisfactory condition to undergo the procedure.                           After obtaining informed consent, the endoscope was   passed under direct vision. Throughout the                            procedure, the patient's blood pressure, pulse, and                            oxygen saturations were monitored continuously. The                            [id number]-[id number] ([id number]) Olympus gastroscope was                            introduced through the mouth, and advanced to the                            second part of duodenum. The upper GI endoscopy was                            accomplished without difficulty. The patient                            tolerated the procedure well. Scope In: Scope Out: Findings:      The examined esophagus was normal.      The entire examined stomach was normal.      The duodenal bulb, first portion of the duodenum and second portion of       the duodenum were normal.      No old or fresh blood was seen to the extent of our examination. Impression:               - Normal esophagus.                           - Normal stomach.                           - Normal duodenal bulb, first portion of the                            duodenum and second portion of the duodenum.                           - No source of  anemia or blood in stool on today's                            endoscopy. Moderate Sedation:      None Recommendation:           - Perform a colonoscopy today. Procedure Code(s):        --- Professional ---                           [phone], Esophagogastroduodenoscopy, flexible,                            transoral; diagnostic, including collection of                            specimen(s) by brushing or washing, when performed                            (separate procedure) Diagnosis Code(s):        --- Professional ---                           D50.0, Iron deficiency anemia secondary to blood                            loss (chronic)                           K92.1, Melena (includes Hematochezia) CPT copyright 2019 American Medical Association. All rights reserved. The codes  documented in this report are preliminary and upon coder review may  be revised to meet current compliance requirements. Benjamin ModenaWilliam Kynzie Polgar, MD 02/25/2019 10:23:26 AM This report has been signed electronically. Number of Addenda: 0

## 2019-02-25 NOTE — Op Note (Signed)
[facility] Name: Benjamin [name] Procedure Date : 02/25/2019 MRN: [id number] Attending MD: Willis ModenaWilliam Brittnye Josephs , MD Date of Birth: 1965-10-23 CSN: [id number] Age: [age] Admit Type: Inpatient Procedure:                Colonoscopy Indications:              Hematochezia, Acute post hemorrhagic anemia Providers:                Willis ModenaWilliam Jahnia Hewes, MD, Zoe LanJenny Kappus, RN, Arlee Muslimhris                            Chandler Tech., Technician, Sarita Haverokoshi Flowers, CRNA Referring MD:             [facility] Medicines:                General Anesthesia Complications:            No immediate complications. Estimated Blood Loss:     Estimated blood loss: none. Procedure:                Pre-Anesthesia Assessment:                           - Prior to the procedure, a History and Physical                            was performed, and patient medications and                            allergies were reviewed. The patient's tolerance of                            previous anesthesia was also reviewed. The risks                            and benefits of the procedure and the sedation                            options and risks were discussed with the patient.                            All questions were answered, and informed consent                            was obtained. Prior Anticoagulants: The patient has                            taken Coumadin (warfarin), last dose was 5 days                            prior to procedure. ASA Grade Assessment: IV - A                            patient with severe systemic disease that is a  constant threat to life. After reviewing the risks                            and benefits, the patient was deemed in                            satisfactory condition to undergo the procedure.                           After obtaining informed consent, the colonoscope                            was passed under direct vision. Throughout the                             procedure, the patient's blood pressure, pulse, and                            oxygen saturations were monitored continuously. The                            [id number]-[id number] ([id number]) Olympus colonoscope was                            introduced through the anus and advanced to the the                            cecum, identified by appendiceal orifice and                            ileocecal valve. The colonoscopy was performed                            without difficulty. The patient tolerated the                            procedure well. The quality of the bowel                            preparation was adequate. Scope In: 9:55:49 AM Scope Out: 10:14:37 AM Scope Withdrawal Time: 0 hours 13 minutes 29 seconds  Total Procedure Duration: 0 hours 18 minutes 48 seconds  Findings:      Hemorrhoids were found on perianal exam.      Internal hemorrhoids were found during retroflexion. The hemorrhoids       were moderate, a bit erythematous.      No additional abnormalities were found on retroflexion.      Colon otherwise normal; no other polyps, masses, vascular ectasias, or       inflammatory changes were seen.      No old or fresh blood was seen to the extent of our examination. Impression:               - Hemorrhoids found on perianal exam.                           -  Internal hemorrhoids.                           - Suspect blood in stool is rectal outlet bleeding                            from hemorrhoids. Moderate Sedation:      None Recommendation:           - Return patient to hospital ward for ongoing care.                           - Mechanical soft diet today.                           - Continue present medications.                           - Hemorrhoidal suppositories (e.g., Anusol-HC pr                            bid prn) as needed.                           - Liberal water intake, high fiber diet, avoid                             constipation/straining.                           - Ok to resume warfarin from GI perspective.                           - Repeat colonoscopy in 10 years for screening                            purposes.                           [facility] will sign-off; we can arrange outpatient                            follow-up with [place]; please call with any questions;                            thank you for the consultation. Procedure Code(s):        --- Professional ---                           [phone], Colonoscopy, flexible; diagnostic, including                            collection of specimen(s) by brushing or washing,                            when performed (separate procedure) Diagnosis Code(s):        --- Professional ---  K64.8, Other hemorrhoids                           K92.1, Melena (includes Hematochezia)                           D62, Acute posthemorrhagic anemia CPT copyright 2019 American Medical Association. All rights reserved. The codes documented in this report are preliminary and upon coder review may  be revised to meet current compliance requirements. Willis ModenaWilliam Lige Lakeman, MD 02/25/2019 10:29:25 AM This report has been signed electronically. Number of Addenda: 0

## 2019-02-25 NOTE — Progress Notes (Signed)
**Note De-Identified vi Obfusction** PROGRESS NOTE    JAIYDEN EMIG  BCW:[id number] DOB: [birth year] DOA: 02/20/2019 PCP: Cliborne Rigg, NP   Brief Nrrtive:  HPI: Benjamin [name] is  53 y.o. mle with medicl history significnt of PAF on Coumdin, DM type II, hypertension, morbid obesity, nd pulmonry embolus on 5/4; who presents with reports of blood mixed in his stools.  Blood ws bright red in color nd filled the toilet bowl.  He reports hving 5 similr bowel movements since onset of symptoms.  Associted symptoms include right lower qudrnt bdominl pin nd soreness.  Sttes he ws chnged by his primry cre provider to Xrelto due to his recent COVID-19 sttus to reduce need of INR checked, but just received  prescription tody in the mil.  Admitted into the hospitl from 5/4-5/7;  dignosed with bilterl pulmonry embolism where his INR ws found to be subtherpeutic.  It ws discussed chnging him to to 1 of the newer gents, but ptient not  good cndidte due to his weight.  He ws dmitted into the hospitl on 5/23- 526 for shortness of breth found to be COVID-19 positive t tht time.  Repet CT ngiogrm of the chest on 5/20, ws of poor qulity nd.  Showed multiple opcities concerning for virl pneumoni, but no cler signs of PE.  Denies hving ny cough or shortness of breth or fever since tht time.  ED Course: On dmission to the emergency deprtment ptient ws seen to be febrile, respirtions 11-24, blood pressure elevted to 168/92, nd O2 sturtion mintined on room ir.  Lbs reveled WBC 11.6 with low MCV, pltelets 413, INR 1.9, nd CMP within norml limits.  No imging studies were performed.  TRH clled to dmit into the hospitl.  Assessment & Pln:   Principl Problem:   Rectl bleeding Active Problems:   Acute pulmonry embolism (HCC)   Dibetes mellitus type 2 in obese (HCC)   PAF (proxysml tril fibrilltion) (HCC)   Hemtochezi, hypochromic nemi: Ptient  presents with reports of hemtochezi.   Recurrent episode 6/4 AM per nursing  Anticogultion plced on hold Type nd screen Cler liquid diet -> he's sking for norml diet, will switch this for tody He continues to hve bloody stools, improving Apprecite GI recs -> pln for endoscopy upper nd lower tomorrow 6/8 -> notble for hemorrhoids Strt nusol  Hb slightly downtrended  Recent history of PE: Ptient dignosed with  pulmonry embolus on 5/4 where INR ws subtherpetic.  Repet CT ngiogrm of the chest  poor qulity on 5/20 when he cme in positive for COVID, but no cute pulmonry embolus is pprecited.  Ptient's primry hd tried to strt him on Xrelto to reduce need for INR checks. - restrt wrfrin without bridge  - repet Kore without dvt seen   Recent COVID 19 infection: pt now testing negtive on 6/3.  He told me this ws his 2nd negtive test, but on dditionl discussion tody, he ppers to be referring to test from dmission in [facility] [place].  He notes he's been sx for 7+ dys.    OHS O2 requirement: strted on oxygen t most recent dmission.  Appers to continue to need this.  Likely 2/2 OHS. Likely needs outptient sleep study t some time s well  Proxysml tril fibrilltion: Ptient ppers to be in sinus rhythm t this time. CHA2DS2-VASc score = 3. - Continue Lbetlol  Subtherpeutic INR: INR 1.9 on dmission.   - Holding nticogultion due to bove  Essentil Hypertension: stble. - Continue **Note De-Identified vi Obfusction** lbetlol, mlodipine, lisinopril  Abdominl pin: Right lower qudrnt bdominl pin ppers secondry to Lovenox injections. - Continue to monitor  Dibetes Mellitus type 2: Uncontrolled. HbgA1c 7.6 - Hypoglycemic protocol - hold metformin - CBG q AC with Moderte SSI  Acute Kidney Injury: follow, my need to hold lisinopril/spironolctone.   Morbid obesity: BMI 55 kg/m.  DVT prophylxis: SCD Code Sttus: full  Fmily Communiction: none t  bedside Disposition Pln: pending.  Pt still with bloody stool, but seems like it is improving.  Continue to monitor inptient.   Consultnts:   GI  Procedures:  Colonoscopy Impressions - Hemorrhoids found on perinl exm. - Internl hemorrhoids. - Suspect blood in stool is rectl outlet bleeding from hemorrhoids. Recommendtions - Return ptient to hospitl wrd for ongoing cre. - Mechnicl soft diet tody. - Continue present medictions. - Hemorrhoidl suppositories (e.g., Anusol-HC pr bid prn) s needed. - Liberl wter intke, high fiber diet, void constiption/strining. - Ok to resume wrfrin from GI perspective. - Repet colonoscopy in 10 yers for screening purposes. [facility] will sign-off; we cn rrnge outptient follow-up with [facility]; plese cll with ny questions; thnk you for the consulttion.  EGD - Norml esophgus. - Norml stomch. - Norml duodenl bulb, first portion of the duodenum nd second portion of the duodenum. - No source of nemi or blood in stool on tody's endoscopy. Perform  colonoscopy tody.  Antimicrobils: Anti-infectives (From dmission, onwrd)   None     Subjective: Doing well. Continued GI bleeding.  Objective: Vitls:   02/25/19 1115 02/25/19 1117 02/25/19 1141 02/25/19 1415  BP: (!) 162/82 (!) 158/69 (!) 159/98 128/79  Pulse: 95 96  89  Resp: 18 17 18 15   Temp:   98.6 F (37 C) 97.7 F (36.5 C)  TempSrc:   Orl Orl  SpO2: 100% 100% 91% 91%  Weight:      Height:        Intke/Output Summry (Lst 24 hours) t 02/25/2019 1857 Lst dt filed t 02/25/2019 1846 Gross per 24 hour  Intke 1010 ml  Output -  Net 1010 ml   Filed Weights   02/20/19 1226  Weight: (!) 179.2 kg    Exmintion:  Generl: No cute distress. Crdiovsculr: Hert sounds show  regulr rte, nd rhythm. Lungs: Cler to usculttion bilterlly  Abdomen: Soft, nontender, nondistended  Neurologicl: Alert nd oriented 3. Moves  ll extremities 4. Crnil nerves II through XII grossly intct. Skin: Wrm nd dry. No rshes or lesions. Extremities: No clubbing or cynosis. No edem.  Psychitric: Mood nd ffect re norml. Insight nd judgment re pproprite.   Dt Reviewed: I hve personlly reviewed following lbs nd imging studies  CBC: Recent Lbs  Lb 02/20/19 1226  02/21/19 0412  02/22/19 0354  02/23/19 0357 02/23/19 1847 02/24/19 0405 02/24/19 0855 02/24/19 1846 02/25/19 0658  WBC 7.9  --  7.8  --  7.5  --  7.4  --  8.3  --   --  7.4  NEUTROABS 3.8  --   --   --   --   --   --   --   --   --   --   --   HGB 11.6*   < > 11.8*   < > 11.3*   < > 10.8* 10.8* 10.6* 10.7* 10.6* 10.1*  HCT 39.4   < > 39.4   < > 37.9*   < > 36.0* 35.5* 35.1* 35.7* 35.3* 34.1*  MCV 80.4  --  79.9*  --  80.0  --  80.2  --  80.5  --   --  80.6  PLT 413*  --  367  --  383  --  387  --  350  --   --  353   < > = values in this interval not displayed.   Basic Metabolic Panel: Recent Labs  Lab 02/21/19 0412 02/22/19 0354 02/23/19 0357 02/24/19 0405 02/25/19 0658  NA 137 137 139 138 138  K 4.0 3.8 3.7 3.8 3.8  CL 100 99 101 100 100  CO2 28 28 29 29 30   GLUCOSE 120* 149* 127* 111* 111*  BUN 10 11 12 13 10   CREATININE 1.16 1.46* 1.30* 1.22 1.11  CALCIUM 8.9 8.7* 8.6* 8.9 8.6*  MG  --   --  1.8 1.7 1.6*   GFR: Estimated Creatinine Clearance: 127.3 mL/min (by C-G formula based on SCr of 1.11 mg/dL). Liver Function Tests: Recent Labs  Lab 02/20/19 1226 02/22/19 0354 02/23/19 0357 02/24/19 0405 02/25/19 0658  AST 20 20 19 18 20   ALT 24 23 22 21 21   ALKPHOS 42 42 48 48 45  BILITOT 0.5 0.6 0.5 0.3 0.4  PROT 7.4 7.2 7.5 7.4 6.9  ALBUMIN 3.5 3.4* 3.5 3.4* 3.4*   Recent Labs  Lab 02/20/19 1801  LIPASE 23   No results for input(s): AMMONIA in the last 168 hours. Coagulation Profile: Recent Labs  Lab 02/20/19 1226 02/24/19 0405  INR 1.9* 1.2   Cardiac Enzymes: No results for input(s): CKTOTAL, CKMB,  CKMBINDEX, TROPONINI in the last 168 hours. BNP (last 3 results) No results for input(s): PROBNP in the last 8760 hours. HbA1C: No results for input(s): HGBA1C in the last 72 hours. CBG: Recent Labs  Lab 02/24/19 1800 02/24/19 2124 02/25/19 0740 02/25/19 1205 02/25/19 1617  GLUCAP 145* 82 104* 95 129*   Lipid Profile: No results for input(s): CHOL, HDL, LDLCALC, TRIG, CHOLHDL, LDLDIRECT in the last 72 hours. Thyroid Function Tests: No results for input(s): TSH, T4TOTAL, FREET4, T3FREE, THYROIDAB in the last 72 hours. Anemia Panel: No results for input(s): VITAMINB12, FOLATE, FERRITIN, TIBC, IRON, RETICCTPCT in the last 72 hours. Sepsis Labs: No results for input(s): PROCALCITON, LATICACIDVEN in the last 168 hours.  Recent Results (from the past 240 hour(s))  SARS Coronavirus 2     Status: None   Collection Time: 02/20/19  2:36 PM  Result Value Ref Range Status   SARS Coronavirus 2 NOT DETECTED NOT DETECTED Final    Comment: (NOTE) SARS-CoV-2 target nucleic acids are NOT DETECTED. The SARS-CoV-2 RNA is generally detectable in upper and lower respiratory specimens during the acute phase of infection.  Negative  results do not preclude SARS-CoV-2 infection, do not rule out co-infections with other pathogens, and should not be used as the sole basis for treatment or other patient management decisions.  Negative results must be combined with clinical observations, patient history, and epidemiological information. The expected result is Not Detected. Fact Sheet for Patients: http://www.biofiredefense.com/wp-content/uploads/2020/03/BIOFIRE-COVID -19-patients.pdf Fact Sheet for Healthcare Providers: http://www.biofiredefense.com/wp-content/uploads/2020/03/BIOFIRE-COVID -19-hcp.pdf This test is not yet approved or cleared by the [place] FDA and  has been authorized for detection and/or diagnosis of SARS-CoV-2 by FDA under an Emergency Use Authorization (EUA).  This EUA  will remain in effec t (meaning this test can be used) for the duration of  the COVID-19 declaration under Section 564(b)(1) of the Act, 21 U.S.C. section 360bbb-3(b)(1), unless the authorization is terminated or revoked sooner. Performed at  [facility], [place] [address], [place], [place] [address]          Radiology Studies: Nm Gi Blood Loss  Result Date: 02/23/2019 CLINICAL DATA:  Bright red blood per rectum EXAM: NUCLEAR MEDICINE GASTROINTESTINAL BLEEDING SCAN TECHNIQUE: Sequential abdominal images were obtained following intravenous administration of Tc-65m labeled red blood cells. RADIOPHARMACEUTICALS:  21.2 mCi Tc-6m pertechnetate in-vitro labeled red cells. COMPARISON:  None. FINDINGS: Sequential anterior planar scintigraphic images of the abdomen are reviewed through 120 minutes. There is localization of tagged RBCs to blood pool structures and minimal urinary excretion related to free pertechnetate. No evidence of GI lumen localization or peristalsis to suggest nidus of GI bleeding. IMPRESSION: Negative nuclear scintigraphic GI bleeding examination through 120 minutes. No nidus of GI bleeding identified. Electronically Signed   By: Eddie Candle M.D.   On: 02/23/2019 22:21        Scheduled Meds: . amLODipine  10 mg Oral Daily  . atorvastatin  40 mg Oral QHS  . bisacodyl  10 mg Rectal Once  . furosemide  80 mg Oral TID  . hydrocortisone  25 mg Rectal BID  . insulin aspart  0-15 Units Subcutaneous TID WC  . insulin aspart  0-5 Units Subcutaneous QHS  . labetalol  200 mg Oral Daily  . lisinopril  40 mg Oral Daily  . pantoprazole  40 mg Oral Daily  . spironolactone  50 mg Oral Daily  . triamcinolone cream  1 application Topical TID  . Warfarin - Pharmacist Dosing Inpatient   Does not apply q1800   Continuous Infusions:   LOS: 4 days    Time spent: over 30 min    Fayrene Helper, MD [facility] Pager AMION  If 7PM-7AM, please contact night-coverage  [email or web address] Password [facility] 02/25/2019, 6:57 PM

## 2019-02-25 NOTE — Progress Notes (Signed)
ANTICOAGULATION CONSULT NOTE - Follow Up Consult  Pharmacy Consult for Warfarin Indication: PE / DVT history  No Known Allergies  Patient Measurements: Height: 5\' 11"  (180.3 cm) Weight: (!) 395 lb (179.2 kg) IBW/kg (Calculated) : 75.3  Vital Signs: Temp: 98.6 F (37 C) (06/08 1141) Temp Source: Oral (06/08 1141) BP: 159/98 (06/08 1141) Pulse Rate: 96 (06/08 1117)  Labs: Recent Labs    02/23/19 0357  02/24/19 0405 02/24/19 0855 02/24/19 1846 02/25/19 0658  HGB 10.8*   < > 10.6* 10.7* 10.6* 10.1*  HCT 36.0*   < > 35.1* 35.7* 35.3* 34.1*  PLT 387  --  350  --   --  353  LABPROT  --   --  14.6  --   --   --   INR  --   --  1.2  --   --   --   CREATININE 1.30*  --  1.22  --   --  1.11   < > = values in this interval not displayed.    Estimated Creatinine Clearance: 127.3 mL/min (by C-G formula based on SCr of 1.11 mg/dL).   Assessment: 53 year old male to restart warfarin for recent PE Dose prior to admission is 6 mg daily  Goal of Therapy:  INR 2-3 Monitor platelets by anticoagulation protocol: Yes   Plan:  Warfarin 7.5 mg po x 1 dose at 1800 pm Daily INR  Thank you Anette Guarneri, PharmD [phone]  02/25/2019,1:00 PM

## 2019-02-25 NOTE — Interval H&P Note (Signed)
History and Physical Interval Note:  02/25/2019 8:57 AM  Benjamin [name]  has presented today for surgery, with the diagnosis of blood in stool, anemia.  The various methods of treatment have been discussed with the patient and family. After consideration of risks, benefits and other options for treatment, the patient has consented to  Procedure(s): ESOPHAGOGASTRODUODENOSCOPY (EGD) WITH PROPOFOL (Left) COLONOSCOPY WITH PROPOFOL (Left) as a surgical intervention.  The patient's history has been reviewed, patient examined, no change in status, stable for surgery.  I have reviewed the patient's chart and labs.  Questions were answered to the patient's satisfaction.     Landry Dyke

## 2019-02-25 NOTE — Anesthesia Procedure Notes (Addendum)
Procedure Name: Intubation Date/Time: 02/25/2019 9:28 AM Performed by: Honore Wipperfurth T, CRNA Pre-anesthesia Checklist: Patient identified, Emergency Drugs available, Suction available and Patient being monitored Patient Re-evaluated:Patient Re-evaluated prior to induction Oxygen Delivery Method: Circle system utilized Preoxygenation: Pre-oxygenation with 100% oxygen Induction Type: Combination inhalational/ intravenous induction Ventilation: Mask ventilation with difficulty, Oral airway inserted - appropriate to patient size and Two handed mask ventilation required Laryngoscope Size: Glidescope and 4 Tube type: Oral Tube size: 7.5 mm Number of attempts: 2 Airway Equipment and Method: Patient positioned with wedge pillow,  Stylet and Video-laryngoscopy Placement Confirmation: ETT inserted through vocal cords under direct vision,  positive ETCO2 and breath sounds checked- equal and bilateral Secured at: 24 cm Tube secured with: Tape Dental Injury: Teeth and Oropharynx as per pre-operative assessment  Difficulty Due To: Difficulty was anticipated, Difficult Airway- due to large tongue and Difficult Airway- due to limited oral opening

## 2019-02-25 NOTE — Anesthesia Postprocedure Evaluation (Signed)
Anesthesia Post Note  Patient: Benjamin [name]  Procedure(s) Performed: ESOPHAGOGASTRODUODENOSCOPY (EGD) WITH PROPOFOL (Left ) COLONOSCOPY WITH PROPOFOL (Left )     Patient location during evaluation: PACU Anesthesia Type: General Level of consciousness: sedated and patient cooperative Pain management: pain level controlled Vital Signs Assessment: post-procedure vital signs reviewed and stable Respiratory status: spontaneous breathing Cardiovascular status: stable Anesthetic complications: no    Last Vitals:  Vitals:   02/25/19 1117 02/25/19 1141  BP: (!) 158/69 (!) 159/98  Pulse: 96   Resp: 17 18  Temp:  37 C  SpO2: 100% 91%    Last Pain:  Vitals:   02/25/19 1141  TempSrc: Oral  PainSc:                  Nolon Nations

## 2019-02-25 NOTE — Anesthesia Preprocedure Evaluation (Addendum)
Anesthesia Evaluation  Patient identified by MRN, date of birth, ID band Patient awake    Reviewed: Allergy & Precautions, NPO status , Patient's Chart, lab work & pertinent test results, reviewed documented beta blocker date and time   Airway Mallampati: III  TM Distance: >3 FB Neck ROM: Full    Dental  (+) Dental Advisory Given   Pulmonary shortness of breath, sleep apnea , former smoker,    Pulmonary exam normal breath sounds clear to auscultation       Cardiovascular hypertension, Pt. on home beta blockers and Pt. on medications +CHF  Normal cardiovascular exam Rhythm:Regular Rate:Normal     Neuro/Psych negative neurological ROS  negative psych ROS   GI/Hepatic negative GI ROS, Neg liver ROS,   Endo/Other  diabetesMorbid obesity  Renal/GU negative Renal ROS  negative genitourinary   Musculoskeletal negative musculoskeletal ROS (+)   Abdominal (+) + obese,   Peds negative pediatric ROS (+)  Hematology negative hematology ROS (+)   Anesthesia Other Findings   Reproductive/Obstetrics                             Anesthesia Physical Anesthesia Plan  ASA: IV  Anesthesia Plan: General   Post-op Pain Management:    Induction: Intravenous  PONV Risk Score and Plan: 1 and Ondansetron and Dexamethasone  Airway Management Planned: Oral ETT and Video Laryngoscope Planned  Additional Equipment: None  Intra-op Plan:   Post-operative Plan: Extubation in OR  Informed Consent: I have reviewed the patients History and Physical, chart, labs and discussed the procedure including the risks, benefits and alternatives for the proposed anesthesia with the patient or authorized representative who has indicated his/her understanding and acceptance.     Dental advisory given  Plan Discussed with: CRNA  Anesthesia Plan Comments:        Anesthesia Quick Evaluation

## 2019-02-25 NOTE — Progress Notes (Signed)
SATURATION QUALIFICATIONS: (This note is used to comply with regulatory documentation for home oxygen)  Patient Saturations on Room Air at Rest = 91%  Patient Saturations on Room Air while Ambulating = 79%  Patient Saturations on 2 Liters of oxygen while Ambulating = 92%  Please briefly explain why patient needs home oxygen:

## 2019-02-25 NOTE — Transfer of Care (Signed)
Immediate Anesthesia Transfer of Care Note  Patient: Benjamin [name]  Procedure(s) Performed: ESOPHAGOGASTRODUODENOSCOPY (EGD) WITH PROPOFOL (Left ) COLONOSCOPY WITH PROPOFOL (Left )  Patient Location: PACU  Anesthesia Type:General  Level of Consciousness: drowsy  Airway & Oxygen Therapy: Patient Spontanous Breathing and Patient connected to face mask oxygen  Post-op Assessment: Report given to RN, Post -op Vital signs reviewed and stable and Patient moving all extremities  Post vital signs: Reviewed and stable  Last Vitals:  Vitals Value Taken Time  BP 168/89 02/25/2019 10:56 AM  Temp 36.9 C 02/25/2019 10:56 AM  Pulse 96 02/25/2019 10:58 AM  Resp 16 02/25/2019 10:58 AM  SpO2 100 % 02/25/2019 10:58 AM  Vitals shown include unvalidated device data.  Last Pain:  Vitals:   02/25/19 1056  TempSrc:   PainSc: 0-No pain         Complications: No apparent anesthesia complications

## 2019-02-25 NOTE — Progress Notes (Signed)
Patient returned from endoscopy. Denies pain. Oxygen saturation 88-91% on room air and is refusing to wear nasal cannula. Patient ordered lunch. Call bell in reach.

## 2019-02-25 NOTE — Progress Notes (Addendum)
ANTICOAGULATION CONSULT NOTE - Follow Up Consult  Pharmacy Consult for Warfarin Indication: PE / DVT history  No Known Allergies  Patient Measurements: Height: 5\' 11"  (180.3 cm) Weight: (!) 395 lb (179.2 kg) IBW/kg (Calculated) : 75.3  Vital Signs: Temp: 97.7 F (36.5 C) (06/08 1415) Temp Source: Oral (06/08 1415) BP: 128/79 (06/08 1415) Pulse Rate: 89 (06/08 1415)  Labs: Recent Labs    02/23/19 0357  02/24/19 0405  02/24/19 1846 02/25/19 0658 02/25/19 1834  HGB 10.8*   < > 10.6*   < > 10.6* 10.1* 10.3*  HCT 36.0*   < > 35.1*   < > 35.3* 34.1* 34.7*  PLT 387  --  350  --   --  353  --   LABPROT  --   --  14.6  --   --   --   --   INR  --   --  1.2  --   --   --   --   CREATININE 1.30*  --  1.22  --   --  1.11  --    < > = values in this interval not displayed.    Estimated Creatinine Clearance: 127.3 mL/min (by C-G formula based on SCr of 1.11 mg/dL).   Assessment: 53 year old male to restart warfarin for recent PE Dose prior to admission is 13.5 mg (6 mg tablet plus 7.5 mg tablet0  Goal of Therapy:  INR 2-3 Monitor platelets by anticoagulation protocol: Yes   Plan:  Patient rec'd warfarin 7.5 mg po x 1 dose on 6/8 evening; due to home dose of 13.5 mg, will give additional 2.5 mg for a total of 10 mg this evening; pharmacy will monitor INRs and adjust dose as needed Daily INR  Thank you Gillermina Hu, PharmD, BCPS, [facility] Clinical Pharmacist 02/25/2019,7:16 PM

## 2019-02-25 NOTE — Social Work (Signed)
Pt from [employer], plan to return at dc.  Spoke with pt via telephone, he states when he is cleared for dc he will call someone to come and pick him up, he gives permission for a PCP appointment to be made for him at discharge.  Westley Hummer, MSW, [place] [employer] [phone]

## 2019-02-26 ENCOUNTER — Inpatient Hospital Stay (HOSPITAL_COMMUNITY): Payer: Medicaid Other

## 2019-02-26 ENCOUNTER — Encounter (HOSPITAL_COMMUNITY): Payer: Self-pay | Admitting: Gastroenterology

## 2019-02-26 LAB — PROTIME-INR
INR: 1.2 (ref 0.8–1.2)
Prothrombin Time: 15.3 seconds — ABNORMAL HIGH (ref 11.4–15.2)

## 2019-02-26 LAB — COMPREHENSIVE METABOLIC PANEL
ALT: 24 U/L (ref 0–44)
AST: 21 U/L (ref 15–41)
Albumin: 3.3 g/dL — ABNORMAL LOW (ref 3.5–5.0)
Alkaline Phosphatase: 48 U/L (ref 38–126)
Anion gap: 9 (ref 5–15)
BUN: 16 mg/dL (ref 6–20)
CO2: 29 mmol/L (ref 22–32)
Calcium: 8.8 mg/dL — ABNORMAL LOW (ref 8.9–10.3)
Chloride: 99 mmol/L (ref 98–111)
Creatinine, Ser: 1.37 mg/dL — ABNORMAL HIGH (ref 0.61–1.24)
GFR calc Af Amer: >60 mL/min (ref >60)
GFR calc non Af Amer: 58 mL/min — ABNORMAL LOW (ref >60)
Glucose, Bld: 183 mg/dL — ABNORMAL HIGH (ref 70–99)
Potassium: 4.5 mmol/L (ref 3.5–5.1)
Sodium: 137 mmol/L (ref 135–145)
Total Bilirubin: 0.3 mg/dL (ref 0.3–1.2)
Total Protein: 7.4 g/dL (ref 6.5–8.1)

## 2019-02-26 LAB — CBC
HCT: 32.9 % — ABNORMAL LOW (ref 39.0–52.0)
Hemoglobin: 9.8 g/dL — ABNORMAL LOW (ref 13.0–17.0)
MCH: 24.2 pg — ABNORMAL LOW (ref 26.0–34.0)
MCHC: 29.8 g/dL — ABNORMAL LOW (ref 30.0–36.0)
MCV: 81.2 fL (ref 80.0–100.0)
Platelets: 362 10*3/uL (ref 150–400)
RBC: 4.05 MIL/uL — ABNORMAL LOW (ref 4.22–5.81)
RDW: 15.6 % — ABNORMAL HIGH (ref 11.5–15.5)
WBC: 13.2 10*3/uL — ABNORMAL HIGH (ref 4.0–10.5)
nRBC: 0 % (ref 0.0–0.2)

## 2019-02-26 LAB — TYPE AND SCREEN
ABO/RH(D): O POS
Antibody Screen: NEGATIVE

## 2019-02-26 LAB — GLUCOSE, CAPILLARY
Glucose-Capillary: 125 mg/dL — ABNORMAL HIGH (ref 70–99)
Glucose-Capillary: 175 mg/dL — ABNORMAL HIGH (ref 70–99)
Glucose-Capillary: 159 mg/dL — ABNORMAL HIGH (ref 70–99)
Glucose-Capillary: 160 mg/dL — ABNORMAL HIGH (ref 70–99)

## 2019-02-26 LAB — MAGNESIUM: Magnesium: 1.9 mg/dL (ref 1.7–2.4)

## 2019-02-26 LAB — HEMOGLOBIN AND HEMATOCRIT, BLOOD
HCT: 31.4 % — ABNORMAL LOW (ref 39.0–52.0)
Hemoglobin: 9.4 g/dL — ABNORMAL LOW (ref 13.0–17.0)

## 2019-02-26 MED ORDER — IOHEXOL 350 MG/ML SOLN
80.0000 mL | Freq: Once | INTRAVENOUS | Status: AC | PRN
  Administered 2019-02-26: 12:00:00 80 mL via INTRAVENOUS

## 2019-02-26 MED ORDER — WARFARIN SODIUM 2.5 MG PO TABS
12.5000 mg | ORAL_TABLET | Freq: Once | ORAL | Status: DC
  Filled 2019-02-26: qty 1

## 2019-02-26 MED ORDER — MENTHOL 3 MG MT LOZG
1.0000 | LOZENGE | OROMUCOSAL | Status: DC | PRN
  Administered 2019-02-28: 14:00:00 3 mg via ORAL
  Filled 2019-02-26 (×6): qty 9

## 2019-02-26 MED ORDER — FERROUS SULFATE 325 (65 FE) MG PO TABS
325.0000 mg | ORAL_TABLET | Freq: Every day | ORAL | Status: DC
  Administered 2019-02-27 – 2019-03-01 (×3): 325 mg via ORAL
  Filled 2019-02-26 (×3): qty 1

## 2019-02-26 MED ORDER — TECHNETIUM TO 99M ALBUMIN AGGREGATED
1.8000 | Freq: Once | INTRAVENOUS | Status: AC | PRN
  Administered 2019-02-26: 17:00:00 1.8 via INTRAVENOUS

## 2019-02-26 NOTE — Progress Notes (Signed)
Patient returned from bathroom with bowel movement with very large frank blood in toilet. MD paged and notified. MD will be in to see patient. Will continue to monitor closely.

## 2019-02-26 NOTE — Progress Notes (Addendum)
PROGRESS NOTE    Benjamin [name]  XVQ:[id number] DOB: 1966-06-26 DOA: 02/20/2019 PCP: Claiborne Rigg, NP   Brief Narrative:  HPI: Benjamin [name] is a 53 y.o. male with medical history significant of PAF on Coumadin, DM type II, hypertension, morbid obesity, and pulmonary embolus on 5/4; who presents with reports of blood mixed in his stools.  Blood was bright red in color and filled the toilet bowl.  He reports having 5 similar bowel movements since onset of symptoms.  Associated symptoms include right lower quadrant abdominal pain and soreness.  States he was changed by his primary care provider to Xarelto due to his recent COVID-19 status to reduce need of INR checked, but just received a prescription today in the mail.  Admitted into the hospital from 5/4-5/7;  diagnosed with bilateral pulmonary embolism where his INR was found to be subtherapeutic.  It was discussed changing him to to 1 of the newer agents, but patient not a good candidate due to his weight.  He was admitted into the hospital on 5/23- 526 for shortness of breath found to be COVID-19 positive at that time.  Repeat CT angiogram of the chest on 5/20, was of poor quality and.  Showed multiple opacities concerning for viral pneumonia, but no clear signs of PE.  Denies having any cough or shortness of breath or fever since that time.  ED Course: On admission to the emergency department patient was seen to be afebrile, respirations 11-24, blood pressure elevated to 168/92, and O2 saturation maintained on room air.  Labs revealed WBC 11.6 with low MCV, platelets 413, INR 1.9, and CMP within normal limits.  No imaging studies were performed.  TRH called to admit into the hospital.  Assessment & Plan:   Principal Problem:   Rectal bleeding Active Problems:   Acute pulmonary embolism (HCC)   Diabetes mellitus type 2 in obese (HCC)   PAF (paroxysmal atrial fibrillation) (HCC)   Hematochezia, hypochromic anemia   Hemorrhoids: Patient presents with reports of hematochezia.   Bleeding had improved, but now worse S/p EGD and colonoscopy by GI notable for external/internal hemorrhoids Anticoagulation placed on hold again Anusol Hb downtrending Type and screen Will consult surgery due to persistent bleeding -> they'll see him tomorrow 6/10  Recent history of PE: Patient diagnosed with a pulmonary embolus on 5/4 where INR was subtherapetic.  Repeat CT angiogram of the chest a poor quality on 5/20 when he came in positive for COVID, but no acute pulmonary embolus is appreciated.  Patient's primary had tried to start him on Xarelto to reduce need for INR checks. - Pt with recent PE in 01/2019.  Seems to have been unprovoked (he denies travel, injury, surgery, malignancy - he does not his mother had hx of VTE, though he's unable to expand upon her situation).   - He has negative VQ scan (CT non conclusive) and LE [place].   - Can continue to hold anticoagulation as no evidence of VTE at this time, but with what appears to be unprovoked PE in May, I'd favor continuing anticoagulation after his bleeding is addressed/resolved (pt also has history of afib, which is another indication for anticoagulation).  Would use warfarin rather than doac given lack of evidence in morbid obesity.  Recent COVID 19 infection: pt now testing negative on 6/3.  He notes he's been asx for 7+ days.    OHS O2 requirement: started on oxygen at most recent admission.  Appears to continue to need  this.  Likely 2/2 OHS. Likely needs outpatient sleep study at some time as well Needs home O2 - See home O2 screen from 6/8 at 531 PM  Paroxysmal atrial fibrillation: Patient appears to be in sinus rhythm at this time. CHA2DS2-VASc score = 3 (HTN, DM, HF).  - Continue Labetalol - warfarin on hold  Subtherapeutic INR:   Essential Hypertension: stable. - Continue labetalol, amlodipine, lisinopril  Abdominal pain: Right lower quadrant abdominal  pain appears secondary to Lovenox injections. - Continue to monitor  Diabetes Mellitus type 2: Uncontrolled. HbgA1c 7.6 - Hypoglycemic protocol - hold metformin - CBG q AC with Moderate SSI  Acute Kidney Injury: follow, may need to hold lisinopril/spironolactone.  Creatinine fluctuating over past several days.  Follow.   Morbid obesity: BMI 55 kg/m.  Attention to d/c med rec at time of discharge.  Adjustments may be needed based on d/c plan (initially was completed on 6/8 when planning for d/c, but now pt with worsening bleeding requiring further eval and managemetn).  DVT prophylaxis: SCD Code Status: full  Family Communication: none at bedside Disposition Plan: pending.  Worsening bleeding today, pending surgery eval and improvement in bleeding.   Consultants:   GI  Procedures:  Colonoscopy Impressions - Hemorrhoids found on perianal exam. - Internal hemorrhoids. - Suspect blood in stool is rectal outlet bleeding from hemorrhoids. Recommendations - Return patient to hospital ward for ongoing care. - Mechanical soft diet today. - Continue present medications. - Hemorrhoidal suppositories (e.g., Anusol-HC pr bid prn) as needed. - Liberal water intake, high fiber diet, avoid constipation/straining. - Ok to resume warfarin from GI perspective. - Repeat colonoscopy in 10 years for screening purposes. Deboraha Sprang- [facility] will sign-off; we can arrange outpatient follow-up with us; please call with any questions; thank you for the consultation.  EGD - Normal esophagus. - Normal stomach. - Normal duodenal bulb, first portion of the duodenum and second portion of the duodenum. - No source of anemia or blood in stool on today's endoscopy. Perform a colonoscopy today.  Antimicrobials: Anti-infectives (From admission, onward)   None     Subjective: Didn't feel great today Continued bleeding  Objective: Vitals:   02/25/19 2030 02/26/19 0526 02/26/19 1113 02/26/19 1347  BP:  (!) 119/46 134/72 (!) 117/104 131/86  Pulse: 91 92 88 89  Resp: 19 19  (!) 21  Temp: 98.6 F (37 C) 98.2 F (36.8 C)  98.9 F (37.2 C)  TempSrc: Oral Oral  Oral  SpO2: 93% 90%  95%  Weight:      Height:        Intake/Output Summary (Last 24 hours) at 02/26/2019 1944 Last data filed at 02/26/2019 1849 Gross per 24 hour  Intake 1680 ml  Output -  Net 1680 ml   Filed Weights   02/20/19 1226  Weight: (!) 179.2 kg    Examination:  General: No acute distress. Cardiovascular: Heart sounds show a regular rate, and rhythm. Lungs: Clear to auscultation  Abdomen: Soft, nontender, nondistended Neurological: Alert and oriented 3. Moves all extremities 4. Cranial nerves II through XII grossly intact. Skin: Warm and dry. No rashes or lesions. Extremities: No clubbing or cyanosis. No edema.  Psychiatric: Mood and affect are normal. Insight and judgment are appropriate.   Data Reviewed: I have personally reviewed following labs and imaging studies  CBC: Recent Labs  Lab 02/20/19 1226  02/22/19 0354  02/23/19 0357  02/24/19 0405  02/24/19 1846 02/25/19 0658 02/25/19 1834 02/26/19 0250 02/26/19 1751  WBC  7.9   < > 7.5  --  7.4  --  8.3  --   --  7.4  --  13.2*  --   NEUTROABS 3.8  --   --   --   --   --   --   --   --   --   --   --   --   HGB 11.6*   < > 11.3*   < > 10.8*   < > 10.6*   < > 10.6* 10.1* 10.3* 9.8* 9.4*  HCT 39.4   < > 37.9*   < > 36.0*   < > 35.1*   < > 35.3* 34.1* 34.7* 32.9* 31.4*  MCV 80.4   < > 80.0  --  80.2  --  80.5  --   --  80.6  --  81.2  --   PLT 413*   < > 383  --  387  --  350  --   --  353  --  362  --    < > = values in this interval not displayed.   Basic Metabolic Panel: Recent Labs  Lab 02/22/19 0354 02/23/19 0357 02/24/19 0405 02/25/19 0658 02/26/19 0250  NA 137 139 138 138 137  K 3.8 3.7 3.8 3.8 4.5  CL 99 101 100 100 99  CO2 28 29 29 30 29   GLUCOSE 149* 127* 111* 111* 183*  BUN 11 12 13 10 16   CREATININE 1.46* 1.30* 1.22 1.11  1.37*  CALCIUM 8.7* 8.6* 8.9 8.6* 8.8*  MG  --  1.8 1.7 1.6* 1.9   GFR: Estimated Creatinine Clearance: 103.1 mL/min (A) (by C-G formula based on SCr of 1.37 mg/dL (H)). Liver Function Tests: Recent Labs  Lab 02/22/19 0354 02/23/19 0357 02/24/19 0405 02/25/19 0658 02/26/19 0250  AST 20 19 18 20 21   ALT 23 22 21 21 24   ALKPHOS 42 48 48 45 48  BILITOT 0.6 0.5 0.3 0.4 0.3  PROT 7.2 7.5 7.4 6.9 7.4  ALBUMIN 3.4* 3.5 3.4* 3.4* 3.3*   Recent Labs  Lab 02/20/19 1801  LIPASE 23   No results for input(s): AMMONIA in the last 168 hours. Coagulation Profile: Recent Labs  Lab 02/20/19 1226 02/24/19 0405 02/26/19 0250  INR 1.9* 1.2 1.2   Cardiac Enzymes: No results for input(s): CKTOTAL, CKMB, CKMBINDEX, TROPONINI in the last 168 hours. BNP (last 3 results) No results for input(s): PROBNP in the last 8760 hours. HbA1C: No results for input(s): HGBA1C in the last 72 hours. CBG: Recent Labs  Lab 02/25/19 1617 02/25/19 2034 02/26/19 0759 02/26/19 1238 02/26/19 1741  GLUCAP 129* 176* 125* 175* 159*   Lipid Profile: No results for input(s): CHOL, HDL, LDLCALC, TRIG, CHOLHDL, LDLDIRECT in the last 72 hours. Thyroid Function Tests: No results for input(s): TSH, T4TOTAL, FREET4, T3FREE, THYROIDAB in the last 72 hours. Anemia Panel: No results for input(s): VITAMINB12, FOLATE, FERRITIN, TIBC, IRON, RETICCTPCT in the last 72 hours. Sepsis Labs: No results for input(s): PROCALCITON, LATICACIDVEN in the last 168 hours.  Recent Results (from the past 240 hour(s))  SARS Coronavirus 2     Status: None   Collection Time: 02/20/19  2:36 PM  Result Value Ref Range Status   SARS Coronavirus 2 NOT DETECTED NOT DETECTED Final    Comment: (NOTE) SARS-CoV-2 target nucleic acids are NOT DETECTED. The SARS-CoV-2 RNA is generally detectable in upper and lower respiratory specimens during the acute phase of infection.  Negative  results do not preclude SARS-CoV-2 infection, do not rule  out co-infections with other pathogens, and should not be used as the sole basis for treatment or other patient management decisions.  Negative results must be combined with clinical observations, patient history, and epidemiological information. The expected result is Not Detected. Fact Sheet for Patients: http://www.biofiredefense.com/wp-content/uploads/2020/03/BIOFIRE-COVID -19-patients.pdf Fact Sheet for Healthcare Providers: http://www.biofiredefense.com/wp-content/uploads/2020/03/BIOFIRE-COVID -19-hcp.pdf This test is not yet approved or cleared by the [place] and  has been authorized for detection and/or diagnosis of SARS-CoV-2 by FDA under an Emergency Use Authorization (EUA).  This EUA will remain in effec t (meaning this test can be used) for the duration of  the COVID-19 declaration under Section 564(b)(1) of the Act, 21 U.S.C. section 360bbb-3(b)(1), unless the authorization is terminated or revoked sooner. Performed at [facility], [place] [address] [place], [place] [address]          Radiology Studies: Dg Chest 2 View  Result Date: 02/26/2019 CLINICAL DATA:  Fatigue, short of breath EXAM: CHEST - 2 VIEW COMPARISON:  CT 02/06/2019, chest radiograph 02/06/2019 FINDINGS: Normal cardiac silhouette. A subtle airspace density in the RIGHT lower lobe again noted. Otherwise lungs are clear. No effusion. IMPRESSION: Persistent RIGHT lower lobe pneumonia.  No worsening. Electronically Signed   By: Suzy Bouchard M.D.   On: 02/26/2019 10:39   Ct Angio Chest Pe W Or Wo Contrast  Result Date: 02/26/2019 CLINICAL DATA:  Shortness of breath.  History of pulmonary embolism. EXAM: CT ANGIOGRAPHY CHEST WITH CONTRAST TECHNIQUE: Multidetector CT imaging of the chest was performed using the standard protocol during bolus administration of intravenous contrast. Multiplanar CT image reconstructions and MIPs were obtained to evaluate the vascular anatomy. CONTRAST:  3mL  OMNIPAQUE IOHEXOL 350 MG/ML SOLN COMPARISON:  CTA chest dated Feb 06, 2019. FINDINGS: Cardiovascular: Poor opacification of the pulmonary arteries with non-diagnostic evaluation of the lobar and segmental pulmonary arteries. No central pulmonary embolism. Unchanged mild dilatation of the main pulmonary artery measuring up to 3.6 cm in diameter. Unchanged borderline cardiomegaly. No pericardial effusion. No thoracic aortic aneurysm or dissection. Mediastinum/Nodes: No enlarged mediastinal, hilar, or axillary lymph nodes. Thyroid gland, trachea, and esophagus demonstrate no significant findings. Lungs/Pleura: Previously seen multifocal ground-glass opacities throughout both lungs have resolved. No focal consolidation, pleural effusion, or pneumothorax. Dependent subsegmental atelectasis in both lungs. Upper Abdomen: No acute abnormality. Musculoskeletal: No chest wall abnormality. No acute or significant osseous findings. Review of the MIP images confirms the above findings. IMPRESSION: 1. Poor opacification of the pulmonary arteries with non-diagnostic evaluation of the lobar and segmental pulmonary arteries. No central pulmonary embolism. Consider V/Q scan for further evaluation if there is continued concern for pulmonary embolism. 2. Resolved multilobar pneumonia. Electronically Signed   By: Titus Dubin M.D.   On: 02/26/2019 12:37   Nm Pulmonary Perfusion  Result Date: 02/26/2019 CLINICAL DATA:  Shortness of breath, productive cough, recent diagnosis of pulmonary embolism by CTA chest exam on 01/21/2019 EXAM: NUCLEAR MEDICINE PERFUSION LUNG SCAN TECHNIQUE: Perfusion images were obtained in multiple projections after intravenous injection of radiopharmaceutical. Ventilation scans intentionally deferred if perfusion scan and chest x-ray adequate for interpretation during COVID 19 epidemic. RADIOPHARMACEUTICALS:  1.8 mCi Tc-76m MAA IV COMPARISON:  None Correlation: CTA chest exams 02/26/2019, 02/06/2019,  01/21/2019 FINDINGS: Ventilation exam not performed. Perfusion images demonstrate normal tracer distribution in the lungs bilaterally. No segmental or subsegmental perfusion defects identified. Specifically, no evidence of pulmonary emboli in the lower lobes as noted on an earlier  CT a exam. IMPRESSION: Normal perfusion lung scan. Electronically Signed   By: Ulyses SouthwardMark  Boles M.D.   On: 02/26/2019 17:38        Scheduled Meds: . amLODipine  10 mg Oral Daily  . atorvastatin  40 mg Oral QHS  . bisacodyl  10 mg Rectal Once  . furosemide  80 mg Oral TID  . hydrocortisone  25 mg Rectal BID  . insulin aspart  0-15 Units Subcutaneous TID WC  . insulin aspart  0-5 Units Subcutaneous QHS  . labetalol  200 mg Oral Daily  . lisinopril  40 mg Oral Daily  . pantoprazole  40 mg Oral Daily  . spironolactone  50 mg Oral Daily  . triamcinolone cream  1 application Topical TID   Continuous Infusions:   LOS: 5 days    Time spent: over 30 min    Lacretia Nicksaldwell Powell, MD [facility] Pager AMION  If 7PM-7AM, please contact night-coverage [email or web address] Password [email or web address] 02/26/2019, 7:44 PM

## 2019-02-26 NOTE — Progress Notes (Signed)
ANTICOAGULATION CONSULT NOTE - Follow Up Consult  Pharmacy Consult for Warfarin Indication: PE / DVT history  No Known Allergies  Patient Measurements: Height: 5\' 11"  (180.3 cm) Weight: (!) 395 lb (179.2 kg) IBW/kg (Calculated) : 75.3  Vital Signs: Temp: 98.2 F (36.8 C) (06/09 0526) Temp Source: Oral (06/09 0526) BP: 134/72 (06/09 0526) Pulse Rate: 92 (06/09 0526)  Labs: Recent Labs    02/24/19 0405  02/25/19 0658 02/25/19 1834 02/26/19 0250  HGB 10.6*   < > 10.1* 10.3* 9.8*  HCT 35.1*   < > 34.1* 34.7* 32.9*  PLT 350  --  353  --  362  LABPROT 14.6  --   --   --  15.3*  INR 1.2  --   --   --  1.2  CREATININE 1.22  --  1.11  --  1.37*   < > = values in this interval not displayed.    Estimated Creatinine Clearance: 103.1 mL/min (A) (by C-G formula based on SCr of 1.37 mg/dL (H)).   Assessment: 53 year old male to restart warfarin for recent PE Dose prior to admission 13.5 mg daily?  INR = 1.2  Goal of Therapy:  INR 2-3 Monitor platelets by anticoagulation protocol: Yes   Plan:  Warfarin 12.5 mg po x 1 today Daily INR  Thank you Anette Guarneri, PharmD [phone]  02/26/2019,10:05 AM

## 2019-02-27 DIAGNOSIS — I2699 Other pulmonary embolism without acute cor pulmonale: Secondary | ICD-10-CM

## 2019-02-27 DIAGNOSIS — D62 Acute posthemorrhagic anemia: Secondary | ICD-10-CM

## 2019-02-27 LAB — COMPREHENSIVE METABOLIC PANEL
ALT: 22 U/L (ref 0–44)
AST: 22 U/L (ref 15–41)
Albumin: 3.4 g/dL — ABNORMAL LOW (ref 3.5–5.0)
Alkaline Phosphatase: 49 U/L (ref 38–126)
Anion gap: 9 (ref 5–15)
BUN: 16 mg/dL (ref 6–20)
CO2: 29 mmol/L (ref 22–32)
Calcium: 8.7 mg/dL — ABNORMAL LOW (ref 8.9–10.3)
Chloride: 99 mmol/L (ref 98–111)
Creatinine, Ser: 1.08 mg/dL (ref 0.61–1.24)
GFR calc Af Amer: >60 mL/min (ref >60)
GFR calc non Af Amer: >60 mL/min (ref >60)
Glucose, Bld: 127 mg/dL — ABNORMAL HIGH (ref 70–99)
Potassium: 4 mmol/L (ref 3.5–5.1)
Sodium: 137 mmol/L (ref 135–145)
Total Bilirubin: 0.3 mg/dL (ref 0.3–1.2)
Total Protein: 7 g/dL (ref 6.5–8.1)

## 2019-02-27 LAB — CBC
HCT: 31.4 % — ABNORMAL LOW (ref 39.0–52.0)
Hemoglobin: 9.2 g/dL — ABNORMAL LOW (ref 13.0–17.0)
MCH: 24.1 pg — ABNORMAL LOW (ref 26.0–34.0)
MCHC: 29.3 g/dL — ABNORMAL LOW (ref 30.0–36.0)
MCV: 82.2 fL (ref 80.0–100.0)
Platelets: 332 10*3/uL (ref 150–400)
RBC: 3.82 MIL/uL — ABNORMAL LOW (ref 4.22–5.81)
RDW: 15.7 % — ABNORMAL HIGH (ref 11.5–15.5)
WBC: 11.2 10*3/uL — ABNORMAL HIGH (ref 4.0–10.5)
nRBC: 0.3 % — ABNORMAL HIGH (ref 0.0–0.2)

## 2019-02-27 LAB — MAGNESIUM: Magnesium: 1.7 mg/dL (ref 1.7–2.4)

## 2019-02-27 LAB — GLUCOSE, CAPILLARY
Glucose-Capillary: 120 mg/dL — ABNORMAL HIGH (ref 70–99)
Glucose-Capillary: 111 mg/dL — ABNORMAL HIGH (ref 70–99)
Glucose-Capillary: 125 mg/dL — ABNORMAL HIGH (ref 70–99)
Glucose-Capillary: 129 mg/dL — ABNORMAL HIGH (ref 70–99)

## 2019-02-27 LAB — PROTIME-INR
INR: 1.1 (ref 0.8–1.2)
Prothrombin Time: 14.3 seconds (ref 11.4–15.2)

## 2019-02-27 LAB — HEMOGLOBIN AND HEMATOCRIT, BLOOD
HCT: 29.5 % — ABNORMAL LOW (ref 39.0–52.0)
Hemoglobin: 8.8 g/dL — ABNORMAL LOW (ref 13.0–17.0)

## 2019-02-27 MED ORDER — POLYETHYLENE GLYCOL 3350 17 G PO PACK
17.0000 g | PACK | Freq: Every day | ORAL | Status: DC
  Administered 2019-02-27 – 2019-03-01 (×3): 17 g via ORAL
  Filled 2019-02-27 (×3): qty 1

## 2019-02-27 MED ORDER — DOCUSATE SODIUM 100 MG PO CAPS
100.0000 mg | ORAL_CAPSULE | Freq: Two times a day (BID) | ORAL | Status: DC
  Administered 2019-02-27 – 2019-03-01 (×4): 100 mg via ORAL
  Filled 2019-02-27 (×5): qty 1

## 2019-02-27 NOTE — Consult Note (Signed)
[facility] Consult Note  Benjamin [name] Jul 19, 1966  [id number].    Requesting MD: Dr. Lowell GuitarPowell Chief Complaint/Reason for Consult: GI bleed/hemorrhoids  HPI: Benjamin [name] is a 53 y.o. male with a history of atrial fibrillation, prior PE (diagnosed on 5/4) on Coumadin, hypertension, diabetes and morbid obesity (BMI 55.09) who was admitted on 6/3 for GI bleed.  Patient reports that the end of May he began having bright red blood per the rectum with each bowel movement.  He notes that he had approximately 5 episodes prior to presenting to the ED on 6/3.  He reports that these have been intermittent since admission.  He reports overnight, into today, he has had approximately 5 bloody bowel movements that are bright red in color and fill the toilet bowl.  He reports that after each episode he does feel weak and mildly short of breath along with a "burning" pain in his suprapubic abdomen and right lower quadrant that resolves after approximately 10 minutes of rest. He is tolerating a diet without any abdominal pain. He denies fever, chills, nausea or vomiting. Reports prior hx of what sounds like a peptic ulcer, currently on a PPI.  On admission patient's hemoglobin was 11.6.  INR was 1.9.  Blood thinners have been held since admission. Hgb currently 9.2, down from 9.8 yesterday. Patient is underwent a CTA as well as a VQ scan without evidence of ongoing PE.  He also underwent bilateral lower extremity ultrasounds that were negative for DVT.  Patient underwent a nuclear medicine GI blood loss scan on 6/6 that was negative.  He underwent a endoscopy on 6/8 that was benign and without any findings for his GI bleed.  He also underwent a colonoscopy by Dr. Dulce Sellarutlaw on 6/8 with findings of internal and external hemorrhoids without evidence of old or fresh blood.  General surgery was consulted for hemorrhoids.  The patient denies prior history of this.  He denies any prior abdominal surgeries.   He has never had a colonoscopy prior to this admission.  He denies any alcohol, tobacco or illicit drug use.  Patient reports that he currently lives in a halfway house after getting out of [facility] a few months ago.  He is not currently working.  ROS: Review of Systems  Constitutional: Negative for chills and fever.  Respiratory: Positive for shortness of breath.   Cardiovascular: Negative for chest pain.  Gastrointestinal: Positive for abdominal pain and blood in stool. Negative for melena, nausea and vomiting.  Genitourinary: Negative for dysuria.  All other systems reviewed and are negative.  All systems reviewed and otherwise negative except for as above  Family History  Problem Relation Age of Onset  . Hypertension Mother   . Diabetes Mother     Past Medical History:  Diagnosis Date  . A-fib (HCC)   . Diabetes mellitus without complication (HCC)   . Elevated troponin 02/06/2019  . Hypertension   . Obesity   . Pulmonary embolism (HCC)   . Sleep apnea     Past Surgical History:  Procedure Laterality Date  . COLONOSCOPY WITH PROPOFOL Left 02/25/2019   Procedure: COLONOSCOPY WITH PROPOFOL;  Surgeon: Willis Modenautlaw, William, MD;  [facility];  Service: Endoscopy;  Laterality: Left;  . ESOPHAGOGASTRODUODENOSCOPY (EGD) WITH PROPOFOL Left 02/25/2019   Procedure: ESOPHAGOGASTRODUODENOSCOPY (EGD) WITH PROPOFOL;  Surgeon: Willis Modenautlaw, William, MD;  [facility];  Service: Endoscopy;  Laterality: Left;  . NO PAST SURGERIES      Social History:  reports  that he has quit smoking. He has never used smokeless tobacco. He reports previous alcohol use. He reports previous drug use.  Allergies: No Known Allergies  Medications Prior to Admission  Medication Sig Dispense Refill  . amLODipine (NORVASC) 10 MG tablet Take 10 mg by mouth daily.    Marland Kitchen atorvastatin (LIPITOR) 40 MG tablet Take 40 mg by mouth at bedtime.     . carboxymethylcellulose (REFRESH PLUS) 0.5 % SOLN Place 1  drop into both eyes daily as needed (dry eyes).    . furosemide (LASIX) 80 MG tablet Take 80 mg by mouth 3 (three) times daily.    Marland Kitchen labetalol (NORMODYNE) 200 MG tablet Take 200 mg by mouth daily.    Marland Kitchen lisinopril (ZESTRIL) 40 MG tablet Take 40 mg by mouth daily.    . metFORMIN (GLUCOPHAGE) 500 MG tablet Take 500 mg by mouth 2 (two) times a day.    . methocarbamol (ROBAXIN) 500 MG tablet Take 1 tablet (500 mg total) by mouth every 8 (eight) hours as needed for muscle spasms. 15 tablet 0  . omeprazole (PRILOSEC) 40 MG capsule Take 40 mg by mouth daily before breakfast.    . spironolactone (ALDACTONE) 50 MG tablet Take 50 mg by mouth daily.    . Throat Lozenges (COUGH DROPS MT) 1 drop as needed (for throat dryness).    . triamcinolone cream (KENALOG) 0.1 % Apply 1 application topically 3 (three) times daily.    Marland Kitchen warfarin (COUMADIN) 6 MG tablet Take 6 mg by mouth daily.    . rivaroxaban (XARELTO) 20 MG TABS tablet Take 1 tablet (20 mg total) by mouth daily with supper. Skip one dose of coumadin and start xarelto on the following day. 90 tablet 0    Prior to Admission medications   Medication Sig Start Date End Date Taking? Authorizing Provider  amLODipine (NORVASC) 10 MG tablet Take 10 mg by mouth daily. 01/03/19  Yes [provider]  atorvastatin (LIPITOR) 40 MG tablet Take 40 mg by mouth at bedtime.  01/03/19  Yes [provider]  carboxymethylcellulose (REFRESH PLUS) 0.5 % SOLN Place 1 drop into both eyes daily as needed (dry eyes).   Yes [provider]  furosemide (LASIX) 80 MG tablet Take 80 mg by mouth 3 (three) times daily. 01/03/19  Yes [provider]  labetalol (NORMODYNE) 200 MG tablet Take 200 mg by mouth daily. 01/03/19  Yes [provider]  lisinopril (ZESTRIL) 40 MG tablet Take 40 mg by mouth daily. 01/03/19  Yes [provider]  metFORMIN (GLUCOPHAGE) 500 MG tablet Take 500 mg by mouth 2 (two) times a day. 01/03/19  Yes [provider]  methocarbamol (ROBAXIN) 500 MG tablet Take 1 tablet (500 mg total) by mouth every 8 (eight) hours as needed for muscle spasms. 02/14/19  Yes Leroy Sea, MD  omeprazole (PRILOSEC) 40 MG capsule Take 40 mg by mouth daily before breakfast. 01/03/19  Yes [provider]  spironolactone (ALDACTONE) 50 MG tablet Take 50 mg by mouth daily. 01/03/19  Yes [provider]  Throat Lozenges (COUGH DROPS MT) 1 drop as needed (for throat dryness).   Yes [provider]  triamcinolone cream (KENALOG) 0.1 % Apply 1 application topically 3 (three) times daily. 02/01/19  Yes [provider]  warfarin (COUMADIN) 6 MG tablet Take 6 mg by mouth daily.   Yes [provider]  ferrous sulfate 325 (65 FE) MG tablet Take 1 tablet (325 mg total) by mouth daily  for 30 days. 02/25/19 03/27/19  Zigmund DanielPowell, A Caldwell Jr., MD  hydrocortisone (ANUSOL-HC) 25 MG suppository Place 1 suppository (25 mg total) rectally 2 (two) times daily for 7 days. 02/25/19 03/04/19  Zigmund DanielPowell, A Caldwell Jr., MD  rivaroxaban (XARELTO) 20 MG TABS tablet Take 1 tablet (20 mg total) by mouth daily with supper. Skip one dose of coumadin and start xarelto on the following day. 02/18/19 05/19/19  Claiborne RiggFleming, [name] W, NP  warfarin (COUMADIN) 6 MG tablet Take 13.5 mg daily (take one 6 mg tablet and one 7.5 mg tablet daily).  Please check your INR within the next 3 days for dose adjustments. 02/25/19   Zigmund DanielPowell, A Caldwell Jr., MD  warfarin (COUMADIN) 7.5 MG tablet Take 13.5 mg daily (take one 6 mg tablet and one 7.5 mg tablet daily).  Please check your INR within the next 3 days for dose adjustments. 02/25/19   Zigmund DanielPowell, A Caldwell Jr., MD    Blood pressure 136/81, pulse 88, temperature 98.2 F (36.8 C), temperature source Oral, resp. rate 20, height 5\' 11"  (1.803 m), weight (!) 179.2 kg, SpO2 98 %. Physical Exam: General: pleasant, morbidly obese AA male who is laying in bed in NAD HEENT: head is normocephalic,  atraumatic.  Sclera are noninjected.  Pupils equal and round. Conjunctiva pink. Ears and nose without any masses or lesions.  Mouth is pink and moist. Dentition fair.  Heart: regular, rate, and rhythm.  No obvious murmurs, gallops, or rubs noted.  Palpable pedal pulses bilaterally Lungs: CTAB, no wheezes, rhonchi, or rales noted.  Respiratory effort nonlabored Abd: Soft, ND, some mild tenderness of the RLQ and suprapubic abdomen. Mild firmness that appears superficial of the RLQ. No fluctuance or erythema. +BS, no masses, hernias, or organomegaly. No prior scars.  MS: all 4 extremities are symmetrical with no cyanosis, clubbing. Chronic LE edema GU: Chaperone present. Patient without pain around the rectal area. Perianal sensory intact. No external fissure's palpated or examined. 2 external hemorrhoids noted without evidence of thrombosis or bleeding. No induration of the skin or swelling. Digital Rectal Exam reveals sphincter with good tone. No masses palpated. Scant amount of blood noted on finger after DRE. Skin: warm and dry with no masses, lesions, or rashes Psych: A&Ox3 with an appropriate affect. Neuro: cranial nerves grossly intact, extremity CSM intact bilaterally, normal speech  Results for orders placed or performed during the hospital encounter of 02/20/19 (from the past 48 hour(s))  Glucose, capillary     Status: None   Collection Time: 02/25/19 12:05 PM  Result Value Ref Range   Glucose-Capillary 95 70 - 99 mg/dL  Glucose, capillary     Status: Abnormal   Collection Time: 02/25/19  4:17 PM  Result Value Ref Range   Glucose-Capillary 129 (H) 70 - 99 mg/dL  Hemoglobin and hematocrit, blood     Status: Abnormal   Collection Time: 02/25/19  6:34 PM  Result Value Ref Range   Hemoglobin 10.3 (L) 13.0 - 17.0 g/dL   HCT 16.134.7 (L) 09.639.0 - 04.552.0 %    Comment: Performed at [facility], [address] [place], [place] [address]  Glucose, capillary     Status: Abnormal   Collection  Time: 02/25/19  8:34 PM  Result Value Ref Range   Glucose-Capillary 176 (H) 70 - 99 mg/dL   Comment 1 Notify RN    Comment 2 Document in Chart   Protime-INR     Status: Abnormal   Collection Time: 02/26/19  2:50 AM  Result Value Ref Range   Prothrombin Time 15.3 (H) 11.4 - 15.2 seconds   INR 1.2 0.8 - 1.2    Comment: (NOTE) INR goal varies based on device and disease states. Performed at [facility], [address] [place], [place] [address]   CBC     Status: Abnormal   Collection Time: 02/26/19  2:50 AM  Result Value Ref Range   WBC 13.2 (H) 4.0 - 10.5 K/uL   RBC 4.05 (L) 4.22 - 5.81 MIL/uL   Hemoglobin 9.8 (L) 13.0 - 17.0 g/dL   HCT 60.432.9 (L) 54.039.0 - 98.152.0 %   MCV 81.2 80.0 - 100.0 fL   MCH 24.2 (L) 26.0 - 34.0 pg   MCHC 29.8 (L) 30.0 - 36.0 g/dL   RDW 19.115.6 (H) 47.811.5 - 29.515.5 %   Platelets 362 150 - 400 K/uL   nRBC 0.0 0.0 - 0.2 %    Comment: Performed at [facility], [address] [place], [place] [address]  Comprehensive metabolic panel     Status: Abnormal   Collection Time: 02/26/19  2:50 AM  Result Value Ref Range   Sodium 137 135 - 145 mmol/L   Potassium 4.5 3.5 - 5.1 mmol/L   Chloride 99 98 - 111 mmol/L   CO2 29 22 - 32 mmol/L   Glucose, Bld 183 (H) 70 - 99 mg/dL   BUN 16 6 - 20 mg/dL   Creatinine, Ser 8.651.37 (H) 0.61 - 1.24 mg/dL   Calcium 8.8 (L) 8.9 - 10.3 mg/dL   Total Protein 7.4 6.5 - 8.1 g/dL   Albumin 3.3 (L) 3.5 - 5.0 g/dL   AST 21 15 - 41 U/L   ALT 24 0 - 44 U/L   Alkaline Phosphatase 48 38 - 126 U/L   Total Bilirubin 0.3 0.3 - 1.2 mg/dL   GFR calc non Af Amer 58 (L) >60 mL/min   GFR calc Af Amer >60 >60 mL/min   Anion gap 9 5 - 15    Comment: Performed at [facility], [address] [place], [place] [address]  Magnesium     Status: None   Collection Time: 02/26/19  2:50 AM  Result Value Ref Range   Magnesium 1.9 1.7 - 2.4 mg/dL    Comment: Performed at [facility], [address] [place], [place] [address]  Glucose,  capillary     Status: Abnormal   Collection Time: 02/26/19  7:59 AM  Result Value Ref Range   Glucose-Capillary 125 (H) 70 - 99 mg/dL  Glucose, capillary     Status: Abnormal   Collection Time: 02/26/19 12:38 PM  Result Value Ref Range   Glucose-Capillary 175 (H) 70 - 99 mg/dL  Glucose, capillary     Status: Abnormal   Collection Time: 02/26/19  5:41 PM  Result Value Ref Range   Glucose-Capillary 159 (H) 70 - 99 mg/dL  Hemoglobin and hematocrit, blood     Status: Abnormal   Collection Time: 02/26/19  5:51 PM  Result Value Ref Range   Hemoglobin 9.4 (L) 13.0 - 17.0 g/dL   HCT 84.131.4 (L) 32.439.0 - 40.152.0 %    Comment: Performed at [facility], [address] [place], [place] [address]  Type and screen [facility]     Status: None   Collection Time: 02/26/19  9:07 PM  Result Value Ref Range   ABO/RH(D) O POS    Antibody Screen NEG  Sample Expiration      03/01/2019,2359 Performed at [facility], [address] [place], [place] [address]   Glucose, capillary     Status: Abnormal   Collection Time: 02/26/19  9:07 PM  Result Value Ref Range   Glucose-Capillary 160 (H) 70 - 99 mg/dL   Comment 1 Notify RN    Comment 2 Document in Chart   Protime-INR     Status: None   Collection Time: 02/27/19  7:02 AM  Result Value Ref Range   Prothrombin Time 14.3 11.4 - 15.2 seconds   INR 1.1 0.8 - 1.2    Comment: (NOTE) INR goal varies based on device and disease states. Performed at [facility], [address] [place], [place] [address]   CBC     Status: Abnormal   Collection Time: 02/27/19  7:02 AM  Result Value Ref Range   WBC 11.2 (H) 4.0 - 10.5 K/uL   RBC 3.82 (L) 4.22 - 5.81 MIL/uL   Hemoglobin 9.2 (L) 13.0 - 17.0 g/dL   HCT 73.2 (L) 20.2 - 54.2 %   MCV 82.2 80.0 - 100.0 fL   MCH 24.1 (L) 26.0 - 34.0 pg   MCHC 29.3 (L) 30.0 - 36.0 g/dL   RDW 70.6 (H) 23.7 - 62.8 %   Platelets 332 150 - 400 K/uL   nRBC 0.3 (H) 0.0 - 0.2 %    Comment:  Performed at [facility], [address] [place], [place] [address]  Comprehensive metabolic panel     Status: Abnormal   Collection Time: 02/27/19  7:02 AM  Result Value Ref Range   Sodium 137 135 - 145 mmol/L   Potassium 4.0 3.5 - 5.1 mmol/L   Chloride 99 98 - 111 mmol/L   CO2 29 22 - 32 mmol/L   Glucose, Bld 127 (H) 70 - 99 mg/dL   BUN 16 6 - 20 mg/dL   Creatinine, Ser 6.16 0.61 - 1.24 mg/dL   Calcium 8.7 (L) 8.9 - 10.3 mg/dL   Total Protein 7.0 6.5 - 8.1 g/dL   Albumin 3.4 (L) 3.5 - 5.0 g/dL   AST 22 15 - 41 U/L   ALT 22 0 - 44 U/L   Alkaline Phosphatase 49 38 - 126 U/L   Total Bilirubin 0.3 0.3 - 1.2 mg/dL   GFR calc non Af Amer >60 >60 mL/min   GFR calc Af Amer >60 >60 mL/min   Anion gap 9 5 - 15    Comment: Performed at [facility], [address] [place], [place] [address]  Magnesium     Status: None   Collection Time: 02/27/19  7:02 AM  Result Value Ref Range   Magnesium 1.7 1.7 - 2.4 mg/dL    Comment: Performed at [facility], [address] [place], [place] [address]  Glucose, capillary     Status: Abnormal   Collection Time: 02/27/19  8:20 AM  Result Value Ref Range   Glucose-Capillary 120 (H) 70 - 99 mg/dL   Dg Chest 2 View  Result Date: 02/26/2019 CLINICAL DATA:  Fatigue, short of breath EXAM: CHEST - 2 VIEW COMPARISON:  CT 02/06/2019, chest radiograph 02/06/2019 FINDINGS: Normal cardiac silhouette. A subtle airspace density in the RIGHT lower lobe again noted. Otherwise lungs are clear. No effusion. IMPRESSION: Persistent RIGHT lower lobe pneumonia.  No worsening. Electronically Signed   By: Genevive Bi M.D.   On: 02/26/2019 10:39   Ct  Angio Chest Pe W Or Wo Contrast  Result Date: 02/26/2019 CLINICAL DATA:  Shortness of breath.  History of pulmonary embolism. EXAM: CT ANGIOGRAPHY CHEST WITH CONTRAST TECHNIQUE: Multidetector CT imaging of the chest was performed using the standard protocol during bolus administration of intravenous  contrast. Multiplanar CT image reconstructions and MIPs were obtained to evaluate the vascular anatomy. CONTRAST:  25mL OMNIPAQUE IOHEXOL 350 MG/ML SOLN COMPARISON:  CTA chest dated Feb 06, 2019. FINDINGS: Cardiovascular: Poor opacification of the pulmonary arteries with non-diagnostic evaluation of the lobar and segmental pulmonary arteries. No central pulmonary embolism. Unchanged mild dilatation of the main pulmonary artery measuring up to 3.6 cm in diameter. Unchanged borderline cardiomegaly. No pericardial effusion. No thoracic aortic aneurysm or dissection. Mediastinum/Nodes: No enlarged mediastinal, hilar, or axillary lymph nodes. Thyroid gland, trachea, and esophagus demonstrate no significant findings. Lungs/Pleura: Previously seen multifocal ground-glass opacities throughout both lungs have resolved. No focal consolidation, pleural effusion, or pneumothorax. Dependent subsegmental atelectasis in both lungs. Upper Abdomen: No acute abnormality. Musculoskeletal: No chest wall abnormality. No acute or significant osseous findings. Review of the MIP images confirms the above findings. IMPRESSION: 1. Poor opacification of the pulmonary arteries with non-diagnostic evaluation of the lobar and segmental pulmonary arteries. No central pulmonary embolism. Consider V/Q scan for further evaluation if there is continued concern for pulmonary embolism. 2. Resolved multilobar pneumonia. Electronically Signed   By: Titus Dubin M.D.   On: 02/26/2019 12:37   Nm Pulmonary Perfusion  Result Date: 02/26/2019 CLINICAL DATA:  Shortness of breath, productive cough, recent diagnosis of pulmonary embolism by CTA chest exam on 01/21/2019 EXAM: NUCLEAR MEDICINE PERFUSION LUNG SCAN TECHNIQUE: Perfusion images were obtained in multiple projections after intravenous injection of radiopharmaceutical. Ventilation scans intentionally deferred if perfusion scan and chest x-ray adequate for interpretation during COVID 19 epidemic.  RADIOPHARMACEUTICALS:  1.8 mCi Tc-69m MAA IV COMPARISON:  None Correlation: CTA chest exams 02/26/2019, 02/06/2019, 01/21/2019 FINDINGS: Ventilation exam not performed. Perfusion images demonstrate normal tracer distribution in the lungs bilaterally. No segmental or subsegmental perfusion defects identified. Specifically, no evidence of pulmonary emboli in the lower lobes as noted on an earlier CT a exam. IMPRESSION: Normal perfusion lung scan. Electronically Signed   By: Lavonia Dana M.D.   On: 02/26/2019 17:38    Anti-infectives (From admission, onward)   None       Assessment/Plan A fib Hx of PE (01/21/19) on Coumadin (held since 6/3) Morbid Obesity (BMI 55.09) DM2 HTN AKI - resolved   GI Bleed Anemia Internal/External Hemorrhoids on Colonoscopy  - No indication for urgent/emergent surgery at this time.  - Sitz baths TID, before and after BM's - Stool softeners to avoid constipation - Hold anticoagulation - Trend hgb. Hgb 11.6 on admission. Trend from 6/8-6/10 (10.3>9.4>9.2) - We will continue to follow   ID - None VTE - SCDs, Mobilize FEN - HH/Carb Modified, bowel regimen  Jillyn Ledger, [facility] 02/27/2019, 8:52 AM Pager: [phone]

## 2019-02-27 NOTE — Progress Notes (Signed)
PROGRESS NOTE    JOHNOTHAN BASCOMB  JXB:[id number] DOB: 12-02-1965 DOA: 02/20/2019 PCP: [name], NP   Brief Narrative:  HPI: ZYRION COEY is a 53 y.o. male with medical history significant of PAF on Coumadin, DM type II, hypertension, morbid obesity, and pulmonary embolus on 5/4; who presents with reports of blood mixed in his stools.  Blood was bright red in color and filled the toilet bowl.  He reports having 5 similar bowel movements since onset of symptoms.  Associated symptoms include right lower quadrant abdominal pain and soreness.  States he was changed by his primary care provider to Xarelto due to his recent COVID-19 status to reduce need of INR checked, but just received a prescription today in the mail.  Admitted into the hospital from 5/4-5/7;  diagnosed with bilateral pulmonary embolism where his INR was found to be subtherapeutic.  It was discussed changing him to to 1 of the newer agents, but patient not a good candidate due to his weight.  He was admitted into the hospital on 5/23- 526 for shortness of breath found to be COVID-19 positive at that time.  Repeat CT angiogram of the chest on 5/20, was of poor quality and.  Showed multiple opacities concerning for viral pneumonia, but no clear signs of PE.  Denies having any cough or shortness of breath or fever since that time.  ED Course: On admission to the emergency department patient was seen to be afebrile, respirations 11-24, blood pressure elevated to 168/92, and O2 saturation maintained on room air.  Labs revealed WBC 11.6 with low MCV, platelets 413, INR 1.9, and CMP within normal limits.  No imaging studies were performed.  TRH called to admit into the hospital.  Assessment & Plan:   Principal Problem:   Rectal bleeding Active Problems:   Acute pulmonary embolism (HCC)   Diabetes mellitus type 2 in obese (HCC)   PAF (paroxysmal atrial fibrillation) ([place])   ##GI bleed  -patient presents with reports of  hematochezia.   -S/p EGD and colonoscopy by GI notable for external/internal hemorrhoids -Anticoagulation placed on hold again -Anusol -Patient continued to have bloody stools.  Stool in the commode is seen by me.  Looks dark, loose stools. - consulted surgery due to persistent bleeding -> recommended to continue with medical management with the sitz bath's, Anusol suppositories, prevent constipation. -Also concern about if patient has small bowel bleed.  ##Acute blood loss anemia -Admitted with hemoglobin of 11.6 -Trended down to 9.2 today  ##Recent history of PE:  -Patient diagnosed with a pulmonary embolus on 5/4 where INR was subtherapetic.  Repeat CT angiogram of the chest a poor quality on 5/20 when he came in positive for COVID, but no acute pulmonary embolus is appreciated.  Patient's primary had tried to start him on Xarelto to reduce need for INR checks. - Pt with recent PE in 01/2019.  Seems to have been unprovoked (he denies travel, injury, surgery, malignancy - he does not his mother had hx of VTE, though he's unable to expand upon her situation).   - He has negative VQ scan (CT non conclusive) and LE [place].   - Can continue to hold anticoagulation as no evidence of VTE at this time, but with what appears to be unprovoked PE in May, I'd favor continuing anticoagulation after his bleeding is addressed/resolved (pt also has history of afib, which is another indication for anticoagulation).  Would use warfarin rather than doac given lack of evidence in  morbid obesity.  ##Recent COVID 19 infection:  -pt now testing negative on 6/3.  He notes he's been asx for 7+ days.    ##OHS O2 requirement: - started on oxygen at most recent admission.  Appears to continue to need this.  Likely 2/2 OHS. -Likely needs outpatient sleep study at some time as well -Needs home O2 - See home O2 screen from 6/8 at 531 PM  ##Paroxysmal atrial fibrillation: Patient appears to be in sinus rhythm at this  time. CHA2DS2-VASc score = 3 (HTN, DM, HF).  - Continue Labetalol - warfarin on hold  ##Subtherapeutic INR:   ##Essential Hypertension: stable. - Continue labetalol, amlodipine, lisinopril  ##Abdominal pain: Right lower quadrant abdominal pain appears secondary to Lovenox injections. - Continue to monitor  ##Diabetes Mellitus type 2: Uncontrolled. HbgA1c 7.6 - Hypoglycemic protocol - hold metformin - CBG q AC with Moderate SSI  ##Acute Kidney Injury:  -f improved holding lisinopril/spironolactone.   -Creatinine fluctuating over past several days.  Follow.   ##Morbid obesity:  -BMI 55 kg/m -Counseling done regarding diet and exercise  Attention to d/c med rec at time of discharge.  Adjustments may be needed based on d/c plan (initially was completed on 6/8 when planning for d/c, but now pt with worsening bleeding requiring further eval and managemetn).  DVT prophylaxis: SCD Code Status: full  Family Communication: none at bedside Disposition Plan: pending.  Worsening bleeding today, pending surgery eval and improvement in bleeding.   Consultants:   GI  Procedures:  Colonoscopy Impressions - Hemorrhoids found on perianal exam. - Internal hemorrhoids. - Suspect blood in stool is rectal outlet bleeding from hemorrhoids. Recommendations - Return patient to hospital ward for ongoing care. - Mechanical soft diet today. - Continue present medications. - Hemorrhoidal suppositories (e.g., Anusol-HC pr bid prn) as needed. - Liberal water intake, high fiber diet, avoid constipation/straining. - Ok to resume warfarin from GI perspective. - Repeat colonoscopy in 10 years for screening purposes. [facility] will sign-off; we can arrange outpatient follow-up with [place]; please call with any questions; thank you for the consultation.  EGD - Normal esophagus. - Normal stomach. - Normal duodenal bulb, first portion of the duodenum and second portion of the duodenum. - No  source of anemia or blood in stool on today's endoscopy. Perform a colonoscopy today.  Antimicrobials: Anti-infectives (From admission, onward)   None     Subjective: Patient states had 5 episodes of bloody stools since last night Objective: Vitals:   02/26/19 1347 02/26/19 2111 02/27/19 0444 02/27/19 1526  BP: 131/86 (!) 138/94 136/81 (!) 128/91  Pulse: 89 87 88 87  Resp: (!) Temp: 98.9 F (37.2 C) 98.8 F (37.1 C) 98.2 F (36.8 C) 98.1 F (36.7 C)  TempSrc: Oral Oral Oral Oral  SpO2: 95%  98% 94%  Weight:      Height:        Intake/Output Summary (Last 24 hours) at 02/27/2019 1540 Last data filed at 02/27/2019 0845 Gross per 24 hour  Intake 960 ml  Output -  Net 960 ml   Filed Weights   02/20/19 1226  Weight: (!) 179.2 kg    Examination:  General: No acute distress. Cardiovascular: Heart sounds show a regular rate, and rhythm. Lungs: Clear to auscultation  Abdomen: Soft, nontender, nondistended Neurological: Alert and oriented 3. Moves all extremities 4. Cranial nerves II through XII grossly intact. Skin: Warm and dry. No rashes or lesions. Extremities: No clubbing or cyanosis.  No edema.  Psychiatric: Mood and affect are normal. Insight and judgment are appropriate.   Data Reviewed: I have personally reviewed following labs and imaging studies  CBC: Recent Labs  Lab 02/23/19 0357  02/24/19 0405  02/25/19 0658 02/25/19 1834 02/26/19 0250 02/26/19 1751 02/27/19 0702  WBC 7.4  --  8.3  --  7.4  --  13.2*  --  11.2*  HGB 10.8*   < > 10.6*   < > 10.1* 10.3* 9.8* 9.4* 9.2*  HCT 36.0*   < > 35.1*   < > 34.1* 34.7* 32.9* 31.4* 31.4*  MCV 80.2  --  80.5  --  80.6  --  81.2  --  82.2  PLT 387  --  350  --  353  --  362  --  332   < > = values in this interval not displayed.   Basic Metabolic Panel: Recent Labs  Lab 02/23/19 0357 02/24/19 0405 02/25/19 0658 02/26/19 0250 02/27/19 0702  NA 139 138 138 137 137  K 3.7 3.8 3.8 4.5 4.0   CL 101 100 100 99 99  CO2 29 29 30 29 29   GLUCOSE 127* 111* 111* 183* 127*  BUN 12 13 10 16 16   CREATININE 1.30* 1.22 1.11 1.37* 1.08  CALCIUM 8.6* 8.9 8.6* 8.8* 8.7*  MG 1.8 1.7 1.6* 1.9 1.7   GFR: Estimated Creatinine Clearance: 130.8 mL/min (by C-G formula based on SCr of 1.08 mg/dL). Liver Function Tests: Recent Labs  Lab 02/23/19 0357 02/24/19 0405 02/25/19 0658 02/26/19 0250 02/27/19 0702  AST 19 18 20 21 22   ALT 22 21 21 24 22   ALKPHOS 48 48 45 48 49  BILITOT 0.5 0.3 0.4 0.3 0.3  PROT 7.5 7.4 6.9 7.4 7.0  ALBUMIN 3.5 3.4* 3.4* 3.3* 3.4*   Recent Labs  Lab 02/20/19 1801  LIPASE 23   No results for input(s): AMMONIA in the last 168 hours. Coagulation Profile: Recent Labs  Lab 02/24/19 0405 02/26/19 0250 02/27/19 0702  INR 1.2 1.2 1.1   Cardiac Enzymes: No results for input(s): CKTOTAL, CKMB, CKMBINDEX, TROPONINI in the last 168 hours. BNP (last 3 results) No results for input(s): PROBNP in the last 8760 hours. HbA1C: No results for input(s): HGBA1C in the last 72 hours. CBG: Recent Labs  Lab 02/26/19 1238 02/26/19 1741 02/26/19 2107 02/27/19 0820 02/27/19 1246  GLUCAP 175* 159* 160* 120* 111*   Lipid Profile: No results for input(s): CHOL, HDL, LDLCALC, TRIG, CHOLHDL, LDLDIRECT in the last 72 hours. Thyroid Function Tests: No results for input(s): TSH, T4TOTAL, FREET4, T3FREE, THYROIDAB in the last 72 hours. Anemia Panel: No results for input(s): VITAMINB12, FOLATE, FERRITIN, TIBC, IRON, RETICCTPCT in the last 72 hours. Sepsis Labs: No results for input(s): PROCALCITON, LATICACIDVEN in the last 168 hours.  Recent Results (from the past 240 hour(s))  SARS Coronavirus 2     Status: None   Collection Time: 02/20/19  2:36 PM  Result Value Ref Range Status   SARS Coronavirus 2 NOT DETECTED NOT DETECTED Final    Comment: (NOTE) SARS-CoV-2 target nucleic acids are NOT DETECTED. The SARS-CoV-2 RNA is generally detectable in upper and lower  respiratory specimens during the acute phase of infection.  Negative  results do not preclude SARS-CoV-2 infection, do not rule out co-infections with other pathogens, and should not be used as the sole basis for treatment or other patient management decisions.  Negative results must be combined with clinical observations, patient history, and epidemiological information.  The expected result is Not Detected. Fact Sheet for Patients: http://www.biofiredefense.com/wp-content/uploads/2020/03/BIOFIRE-COVID -19-patients.pdf Fact Sheet for Healthcare Providers: http://www.biofiredefense.com/wp-content/uploads/2020/03/BIOFIRE-COVID -19-hcp.pdf This test is not yet approved or cleared by the [place] FDA and  has been authorized for detection and/or diagnosis of SARS-CoV-2 by FDA under an Emergency Use Authorization (EUA).  This EUA will remain in effec t (meaning this test can be used) for the duration of  the COVID-19 declaration under Section 564(b)(1) of the Act, 21 U.S.C. section 360bbb-3(b)(1), unless the authorization is terminated or revoked sooner. Performed at [facility], [address] [place], [place] [address]          Radiology Studies: Dg Chest 2 View  Result Date: 02/26/2019 CLINICAL DATA:  Fatigue, short of breath EXAM: CHEST - 2 VIEW COMPARISON:  CT 02/06/2019, chest radiograph 02/06/2019 FINDINGS: Normal cardiac silhouette. A subtle airspace density in the RIGHT lower lobe again noted. Otherwise lungs are clear. No effusion. IMPRESSION: Persistent RIGHT lower lobe pneumonia.  No worsening. Electronically Signed   By: Genevive BiStewart  Edmunds M.D.   On: 02/26/2019 10:39   Ct Angio Chest Pe W Or Wo Contrast  Result Date: 02/26/2019 CLINICAL DATA:  Shortness of breath.  History of pulmonary embolism. EXAM: CT ANGIOGRAPHY CHEST WITH CONTRAST TECHNIQUE: Multidetector CT imaging of the chest was performed using the standard protocol during bolus administration of  intravenous contrast. Multiplanar CT image reconstructions and MIPs were obtained to evaluate the vascular anatomy. CONTRAST:  80mL OMNIPAQUE IOHEXOL 350 MG/ML SOLN COMPARISON:  CTA chest dated Feb 06, 2019. FINDINGS: Cardiovascular: Poor opacification of the pulmonary arteries with non-diagnostic evaluation of the lobar and segmental pulmonary arteries. No central pulmonary embolism. Unchanged mild dilatation of the main pulmonary artery measuring up to 3.6 cm in diameter. Unchanged borderline cardiomegaly. No pericardial effusion. No thoracic aortic aneurysm or dissection. Mediastinum/Nodes: No enlarged mediastinal, hilar, or axillary lymph nodes. Thyroid gland, trachea, and esophagus demonstrate no significant findings. Lungs/Pleura: Previously seen multifocal ground-glass opacities throughout both lungs have resolved. No focal consolidation, pleural effusion, or pneumothorax. Dependent subsegmental atelectasis in both lungs. Upper Abdomen: No acute abnormality. Musculoskeletal: No chest wall abnormality. No acute or significant osseous findings. Review of the MIP images confirms the above findings. IMPRESSION: 1. Poor opacification of the pulmonary arteries with non-diagnostic evaluation of the lobar and segmental pulmonary arteries. No central pulmonary embolism. Consider V/Q scan for further evaluation if there is continued concern for pulmonary embolism. 2. Resolved multilobar pneumonia. Electronically Signed   By: Obie DredgeWilliam T Derry M.D.   On: 02/26/2019 12:37   Nm Pulmonary Perfusion  Result Date: 02/26/2019 CLINICAL DATA:  Shortness of breath, productive cough, recent diagnosis of pulmonary embolism by CTA chest exam on 01/21/2019 EXAM: NUCLEAR MEDICINE PERFUSION LUNG SCAN TECHNIQUE: Perfusion images were obtained in multiple projections after intravenous injection of radiopharmaceutical. Ventilation scans intentionally deferred if perfusion scan and chest x-ray adequate for interpretation during COVID 19  epidemic. RADIOPHARMACEUTICALS:  1.8 mCi Tc-6221m MAA IV COMPARISON:  None Correlation: CTA chest exams 02/26/2019, 02/06/2019, 01/21/2019 FINDINGS: Ventilation exam not performed. Perfusion images demonstrate normal tracer distribution in the lungs bilaterally. No segmental or subsegmental perfusion defects identified. Specifically, no evidence of pulmonary emboli in the lower lobes as noted on an earlier CT a exam. IMPRESSION: Normal perfusion lung scan. Electronically Signed   By: Ulyses SouthwardMark  Boles M.D.   On: 02/26/2019 17:38        Scheduled Meds: . amLODipine  10 mg Oral Daily  . atorvastatin  40 mg Oral  QHS  . bisacodyl  10 mg Rectal Once  . docusate sodium  100 mg Oral BID  . ferrous sulfate  325 mg Oral Q breakfast  . furosemide  80 mg Oral TID  . hydrocortisone  25 mg Rectal BID  . insulin aspart  0-15 Units Subcutaneous TID WC  . insulin aspart  0-5 Units Subcutaneous QHS  . labetalol  200 mg Oral Daily  . lisinopril  40 mg Oral Daily  . pantoprazole  40 mg Oral Daily  . polyethylene glycol  17 g Oral Daily  . spironolactone  50 mg Oral Daily  . triamcinolone cream  1 application Topical TID   Continuous Infusions:   LOS: 6 days    Time spent: over 30 min    Hayle Parisi, MD [facility] Pager AMION  If 7PM-7AM, please contact night-coverage [email or web address] Password [email or web address] 02/27/2019, 3:40 PM

## 2019-02-27 NOTE — Progress Notes (Signed)
Pt refusing to wear CPAP.  

## 2019-02-27 NOTE — Plan of Care (Signed)
  Problem: Pain Managment: Goal: General experience of comfort will improve Outcome: Progressing   Problem: Safety: Goal: Ability to remain free from injury will improve Outcome: Progressing   Problem: Skin Integrity: Goal: Risk for impaired skin integrity will decrease Outcome: Progressing   

## 2019-02-28 DIAGNOSIS — I2601 Septic pulmonary embolism with acute cor pulmonale: Secondary | ICD-10-CM

## 2019-02-28 LAB — CBC
HCT: 29.9 % — ABNORMAL LOW (ref 39.0–52.0)
Hemoglobin: 8.9 g/dL — ABNORMAL LOW (ref 13.0–17.0)
MCH: 24.1 pg — ABNORMAL LOW (ref 26.0–34.0)
MCHC: 29.8 g/dL — ABNORMAL LOW (ref 30.0–36.0)
MCV: 80.8 fL (ref 80.0–100.0)
Platelets: 351 10*3/uL (ref 150–400)
RBC: 3.7 MIL/uL — ABNORMAL LOW (ref 4.22–5.81)
RDW: 15.6 % — ABNORMAL HIGH (ref 11.5–15.5)
WBC: 10 10*3/uL (ref 4.0–10.5)
nRBC: 0 % (ref 0.0–0.2)

## 2019-02-28 LAB — GLUCOSE, CAPILLARY
Glucose-Capillary: 113 mg/dL — ABNORMAL HIGH (ref 70–99)
Glucose-Capillary: 98 mg/dL (ref 70–99)
Glucose-Capillary: 105 mg/dL — ABNORMAL HIGH (ref 70–99)
Glucose-Capillary: 144 mg/dL — ABNORMAL HIGH (ref 70–99)

## 2019-02-28 LAB — HEMOGLOBIN AND HEMATOCRIT, BLOOD
HCT: 28.2 % — ABNORMAL LOW (ref 39.0–52.0)
Hemoglobin: 8.5 g/dL — ABNORMAL LOW (ref 13.0–17.0)

## 2019-02-28 LAB — PROTIME-INR
INR: 1.2 (ref 0.8–1.2)
Prothrombin Time: 14.6 seconds (ref 11.4–15.2)

## 2019-02-28 NOTE — Progress Notes (Signed)
PROGRESS NOTE    Benjamin [name]  IRC:[id number] DOB: Jan 03, 1966 DOA: 02/20/2019 PCP: Claiborne Rigg, NP   Brief Narrative:  HPI: Benjamin [name] is a 53 y.o. male with medical history significant of PAF on Coumadin, DM type II, hypertension, morbid obesity, and pulmonary embolus on 5/4; who presents with reports of blood mixed in his stools.  Blood was bright red in color and filled the toilet bowl.  He reports having 5 similar bowel movements since onset of symptoms.  Associated symptoms include right lower quadrant abdominal pain and soreness.  States he was changed by his primary care provider to Xarelto due to his recent COVID-19 status to reduce need of INR checked, but just received a prescription today in the mail.  Admitted into the hospital from 5/4-5/7;  diagnosed with bilateral pulmonary embolism where his INR was found to be subtherapeutic.  It was discussed changing him to to 1 of the newer agents, but patient not a good candidate due to his weight.  He was admitted into the hospital on 5/23- 526 for shortness of breath found to be COVID-19 positive at that time.  Repeat CT angiogram of the chest on 5/20, was of poor quality and.  Showed multiple opacities concerning for viral pneumonia, but no clear signs of PE.  Denies having any cough or shortness of breath or fever since that time.  ED Course: On admission to the emergency department patient was seen to be afebrile, respirations 11-24, blood pressure elevated to 168/92, and O2 saturation maintained on room air.  Labs revealed WBC 11.6 with low MCV, platelets 413, INR 1.9, and CMP within normal limits.  No imaging studies were performed.  TRH called to admit into the hospital.  Assessment & Plan:   Principal Problem:   Rectal bleeding Active Problems:   Acute pulmonary embolism (HCC)   Diabetes mellitus type 2 in obese (HCC)   PAF (paroxysmal atrial fibrillation) (HCC)   ##GI bleed  -patient presents with reports of  hematochezia.   -S/p EGD and colonoscopy by GI notable for external/internal hemorrhoids -Anticoagulation placed on hold again -Anusol -Patient continued to have bloody stools.  Stool in the commode is seen by me.  Looks dark, loose stools. - consulted surgery due to persistent bleeding -> recommended to continue with medical management with the sitz bath's, Anusol suppositories, prevent constipation. -Also concern about if patient has small bowel bleed. -Patient did not have any episodes since last night  ##Acute blood loss anemia -Admitted with hemoglobin of 11.6 -Trended down to 9.2 > 8.8> 8.9  ##Recent history of PE:  -Patient diagnosed with a pulmonary embolus on 5/4 where INR was subtherapetic.  Repeat CT angiogram of the chest a poor quality on 5/20 when he came in positive for COVID, but no acute pulmonary embolus is appreciated.  Patient's primary had tried to start him on Xarelto to reduce need for INR checks. - Pt with recent PE in 01/2019.  Seems to have been unprovoked (he denies travel, injury, surgery, malignancy - he does not his mother had hx of VTE, though he's unable to expand upon her situation).   - He has negative VQ scan (CT non conclusive) and LE [place].   - Can continue to hold anticoagulation as no evidence of VTE at this time, but with what appears to be unprovoked PE in May, I'd favor continuing anticoagulation after his bleeding is addressed/resolved (pt also has history of afib, which is another indication for anticoagulation).  Would use warfarin rather than doac given lack of evidence in morbid obesity.  ##Recent COVID 19 infection:  -pt now testing negative on 6/3.  He notes he's been asx for 7+ days.    ##OHS O2 requirement: - started on oxygen at most recent admission.  Appears to continue to need this.  Likely 2/2 OHS. -Likely needs outpatient sleep study at some time as well -Needs home O2 - See home O2 screen from 6/8 at 531 PM  ##Paroxysmal atrial  fibrillation: Patient appears to be in sinus rhythm at this time. CHA2DS2-VASc score = 3 (HTN, DM, HF).  - Continue Labetalol - warfarin on hold  ##Subtherapeutic INR:   ##Essential Hypertension: stable. - Continue labetalol, amlodipine, lisinopril  ##Abdominal pain: Right lower quadrant abdominal pain appears secondary to Lovenox injections. - Continue to monitor  ##Diabetes Mellitus type 2: Uncontrolled. HbgA1c 7.6 - Hypoglycemic protocol - hold metformin - CBG q AC with Moderate SSI  ##Acute Kidney Injury:  -f improved holding lisinopril/spironolactone.   -Creatinine fluctuating over past several days.  Follow.   ##Morbid obesity:  -BMI 55 kg/m -Counseling done regarding diet and exercise  Attention to d/c med rec at time of discharge.  Adjustments may be needed based on d/c plan (initially was completed on 6/8 when planning for d/c, but now pt with worsening bleeding requiring further eval and managemetn).  DVT prophylaxis: SCD Code Status: full  Family Communication: none at bedside Disposition Plan: Plan to discharge patient home tomorrow if hemoglobin stable   Consultants:   GI  Procedures:  Colonoscopy Impressions - Hemorrhoids found on perianal exam. - Internal hemorrhoids. - Suspect blood in stool is rectal outlet bleeding from hemorrhoids. Recommendations - Return patient to hospital ward for ongoing care. - Mechanical soft diet today. - Continue present medications. - Hemorrhoidal suppositories (e.g., Anusol-HC pr bid prn) as needed. - Liberal water intake, high fiber diet, avoid constipation/straining. - Ok to resume warfarin from GI perspective. - Repeat colonoscopy in 10 years for screening purposes. Deboraha Sprang- [facility] will sign-off; we can arrange outpatient follow-up with us; please call with any questions; thank you for the consultation.  EGD - Normal esophagus. - Normal stomach. - Normal duodenal bulb, first portion of the duodenum and second  portion of the duodenum. - No source of anemia or blood in stool on today's endoscopy. Perform a colonoscopy today.  Antimicrobials: Anti-infectives (From admission, onward)   None     Subjective: Patient states did not have any episodes of bloody stools.    Objective: Vitals:   02/27/19 2019 02/28/19 0509 02/28/19 1331 02/28/19 1333  BP: 102/69 (!) 114/92 (!) 97/43 (!) 113/92  Pulse: 85 97 85 88  Resp: 20 20 14    Temp: 98.4 F (36.9 C) 98.4 F (36.9 C) 98.5 F (36.9 C)   TempSrc: Oral Oral Oral   SpO2: 98% 99% 94%   Weight:      Height:        Intake/Output Summary (Last 24 hours) at 02/28/2019 1633 Last data filed at 02/28/2019 0904 Gross per 24 hour  Intake 460 ml  Output -  Net 460 ml   Filed Weights   02/20/19 1226  Weight: (!) 179.2 kg    Examination:  General: No acute distress. Cardiovascular: Heart sounds show a regular rate, and rhythm. Lungs: Clear to auscultation  Abdomen: Soft, nontender, nondistended Neurological: Alert and oriented 3. Moves all extremities 4. Cranial nerves II through XII grossly intact. Skin: Warm and dry. No rashes  or lesions. Extremities: No clubbing or cyanosis. No edema.  Psychiatric: Mood and affect are normal. Insight and judgment are appropriate.   Data Reviewed: I have personally reviewed following labs and imaging studies  CBC: Recent Labs  Lab 02/24/19 0405  02/25/19 0658  02/26/19 0250 02/26/19 1751 02/27/19 0702 02/27/19 1812 02/28/19 0819  WBC 8.3  --  7.4  --  13.2*  --  11.2*  --  10.0  HGB 10.6*   < > 10.1*   < > 9.8* 9.4* 9.2* 8.8* 8.9*  HCT 35.1*   < > 34.1*   < > 32.9* 31.4* 31.4* 29.5* 29.9*  MCV 80.5  --  80.6  --  81.2  --  82.2  --  80.8  PLT 350  --  353  --  362  --  332  --  351   < > = values in this interval not displayed.   Basic Metabolic Panel: Recent Labs  Lab 02/23/19 0357 02/24/19 0405 02/25/19 0658 02/26/19 0250 02/27/19 0702  NA 139 138 138 137 137  K 3.7 3.8 3.8 4.5  4.0  CL 101 100 100 99 99  CO2 29 29 30 29 29   GLUCOSE 127* 111* 111* 183* 127*  BUN 12 13 10 16 16   CREATININE 1.30* 1.22 1.11 1.37* 1.08  CALCIUM 8.6* 8.9 8.6* 8.8* 8.7*  MG 1.8 1.7 1.6* 1.9 1.7   GFR: Estimated Creatinine Clearance: 130.8 mL/min (by C-G formula based on SCr of 1.08 mg/dL). Liver Function Tests: Recent Labs  Lab 02/23/19 0357 02/24/19 0405 02/25/19 0658 02/26/19 0250 02/27/19 0702  AST 19 18 20 21 22   ALT 22 21 21 24 22   ALKPHOS 48 48 45 48 49  BILITOT 0.5 0.3 0.4 0.3 0.3  PROT 7.5 7.4 6.9 7.4 7.0  ALBUMIN 3.5 3.4* 3.4* 3.3* 3.4*   No results for input(s): LIPASE, AMYLASE in the last 168 hours. No results for input(s): AMMONIA in the last 168 hours. Coagulation Profile: Recent Labs  Lab 02/24/19 0405 02/26/19 0250 02/27/19 0702 02/28/19 0819  INR 1.2 1.2 1.1 1.2   Cardiac Enzymes: No results for input(s): CKTOTAL, CKMB, CKMBINDEX, TROPONINI in the last 168 hours. BNP (last 3 results) No results for input(s): PROBNP in the last 8760 hours. HbA1C: No results for input(s): HGBA1C in the last 72 hours. CBG: Recent Labs  Lab 02/27/19 1246 02/27/19 1755 02/27/19 2111 02/28/19 0728 02/28/19 1217  GLUCAP 111* 125* 129* 113* 98   Lipid Profile: No results for input(s): CHOL, HDL, LDLCALC, TRIG, CHOLHDL, LDLDIRECT in the last 72 hours. Thyroid Function Tests: No results for input(s): TSH, T4TOTAL, FREET4, T3FREE, THYROIDAB in the last 72 hours. Anemia Panel: No results for input(s): VITAMINB12, FOLATE, FERRITIN, TIBC, IRON, RETICCTPCT in the last 72 hours. Sepsis Labs: No results for input(s): PROCALCITON, LATICACIDVEN in the last 168 hours.  Recent Results (from the past 240 hour(s))  SARS Coronavirus 2     Status: None   Collection Time: 02/20/19  2:36 PM  Result Value Ref Range Status   SARS Coronavirus 2 NOT DETECTED NOT DETECTED Final    Comment: (NOTE) SARS-CoV-2 target nucleic acids are NOT DETECTED. The SARS-CoV-2 RNA is generally  detectable in upper and lower respiratory specimens during the acute phase of infection.  Negative  results do not preclude SARS-CoV-2 infection, do not rule out co-infections with other pathogens, and should not be used as the sole basis for treatment or other patient management decisions.  Negative results must  be combined with clinical observations, patient history, and epidemiological information. The expected result is Not Detected. Fact Sheet for Patients: http://www.biofiredefense.com/wp-content/uploads/2020/03/BIOFIRE-COVID -19-patients.pdf Fact Sheet for Healthcare Providers: http://www.biofiredefense.com/wp-content/uploads/2020/03/BIOFIRE-COVID -19-hcp.pdf This test is not yet approved or cleared by the [place] FDA and  has been authorized for detection and/or diagnosis of SARS-CoV-2 by FDA under an Emergency Use Authorization (EUA).  This EUA will remain in effec t (meaning this test can be used) for the duration of  the COVID-19 declaration under Section 564(b)(1) of the Act, 21 U.S.C. section 360bbb-3(b)(1), unless the authorization is terminated or revoked sooner. Performed at [facility], [address] [place], [place] [address]          Radiology Studies: Nm Pulmonary Perfusion  Result Date: 02/26/2019 CLINICAL DATA:  Shortness of breath, productive cough, recent diagnosis of pulmonary embolism by CTA chest exam on 01/21/2019 EXAM: NUCLEAR MEDICINE PERFUSION LUNG SCAN TECHNIQUE: Perfusion images were obtained in multiple projections after intravenous injection of radiopharmaceutical. Ventilation scans intentionally deferred if perfusion scan and chest x-ray adequate for interpretation during COVID 19 epidemic. RADIOPHARMACEUTICALS:  1.8 mCi Tc-221m MAA IV COMPARISON:  None Correlation: CTA chest exams 02/26/2019, 02/06/2019, 01/21/2019 FINDINGS: Ventilation exam not performed. Perfusion images demonstrate normal tracer distribution in the lungs  bilaterally. No segmental or subsegmental perfusion defects identified. Specifically, no evidence of pulmonary emboli in the lower lobes as noted on an earlier CT a exam. IMPRESSION: Normal perfusion lung scan. Electronically Signed   By: Ulyses SouthwardMark  Boles M.D.   On: 02/26/2019 17:38        Scheduled Meds: . amLODipine  10 mg Oral Daily  . atorvastatin  40 mg Oral QHS  . bisacodyl  10 mg Rectal Once  . docusate sodium  100 mg Oral BID  . ferrous sulfate  325 mg Oral Q breakfast  . furosemide  80 mg Oral TID  . hydrocortisone  25 mg Rectal BID  . insulin aspart  0-15 Units Subcutaneous TID WC  . insulin aspart  0-5 Units Subcutaneous QHS  . labetalol  200 mg Oral Daily  . lisinopril  40 mg Oral Daily  . pantoprazole  40 mg Oral Daily  . polyethylene glycol  17 g Oral Daily  . spironolactone  50 mg Oral Daily  . triamcinolone cream  1 application Topical TID   Continuous Infusions:   LOS: 7 days    Time spent: over 30 min    Lynne Righi, MD [facility] Pager AMION  If 7PM-7AM, please contact night-coverage [email or web address] Password [email or web address] 02/28/2019, 4:33 PM

## 2019-02-28 NOTE — Progress Notes (Addendum)
3 Days Post-Op  Subjective: CC: Bloody stools Patient reports 2 episodes of bloody stools that were less dark and more "pink" yesterday after I saw him. No pain associated with this BM's. He reports he is tolerating a diet without N/V. He reports very mild RLQ "pulling" but no pain. He has not performed sitz baths yet. Spoke with nursing staff who will help him with this.  Objective: Vital signs in last 24 hours: Temp:  [98.1 F (36.7 C)-98.4 F (36.9 C)] 98.4 F (36.9 C) (06/11 0509) Pulse Rate:  [85-97] 97 (06/11 0509) Resp:  [18-20] 20 (06/11 0509) BP: (102-128)/(69-92) 114/92 (06/11 0509) SpO2:  [94 %-99 %] 99 % (06/11 0509) Last BM Date: 02/27/19  Intake/Output from previous day: 06/10 0701 - 06/11 0700 In: 900 [P.O.:900] Out: -  Intake/Output this shift: No intake/output data recorded.  PE: Gen: Awake and alert, NAD Lungs: Normal rate and effort  Abd: Soft, ND, mild tenderness of the RLQ without r/r/g. +BS Skin: Warm and dry  Lab Results:  Recent Labs    02/26/19 0250  02/27/19 0702 02/27/19 1812  WBC 13.2*  --  11.2*  --   HGB 9.8*   < > 9.2* 8.8*  HCT 32.9*   < > 31.4* 29.5*  PLT 362  --  332  --    < > = values in this interval not displayed.   BMET Recent Labs    02/26/19 0250 02/27/19 0702  NA 137 137  K 4.5 4.0  CL 99 99  CO2 29 29  GLUCOSE 183* 127*  BUN 16 16  CREATININE 1.37* 1.08  CALCIUM 8.8* 8.7*   PT/INR Recent Labs    02/26/19 0250 02/27/19 0702  LABPROT 15.3* 14.3  INR 1.2 1.1   CMP     Component Value Date/Time   NA 137 02/27/2019 0702   K 4.0 02/27/2019 0702   CL 99 02/27/2019 0702   CO2 29 02/27/2019 0702   GLUCOSE 127 (H) 02/27/2019 0702   BUN 16 02/27/2019 0702   CREATININE 1.08 02/27/2019 0702   CALCIUM 8.7 (L) 02/27/2019 0702   PROT 7.0 02/27/2019 0702   ALBUMIN 3.4 (L) 02/27/2019 0702   AST 22 02/27/2019 0702   ALT 22 02/27/2019 0702   ALKPHOS 49 02/27/2019 0702   BILITOT 0.3 02/27/2019 0702   GFRNONAA  >60 02/27/2019 0702   GFRAA >60 02/27/2019 0702   Lipase     Component Value Date/Time   LIPASE 23 02/20/2019 1801       Studies/Results: Dg Chest 2 View  Result Date: 02/26/2019 CLINICAL DATA:  Fatigue, short of breath EXAM: CHEST - 2 VIEW COMPARISON:  CT 02/06/2019, chest radiograph 02/06/2019 FINDINGS: Normal cardiac silhouette. A subtle airspace density in the RIGHT lower lobe again noted. Otherwise lungs are clear. No effusion. IMPRESSION: Persistent RIGHT lower lobe pneumonia.  No worsening. Electronically Signed   By: Stewart  Edmunds M.D.   On: 02/26/2019 10:39   Ct Angio Chest Pe W Or Wo Contrast  Result Date: 02/26/2019 CLINICAL DATA:  Shortness of breath.  History of pulmonary embolism. EXAM: CT ANGIOGRAPHY CHEST WITH CONTRAST TECHNIQUE: Multidetector CT imaging of the chest was performed using the standard protocol during bolus administration of intravenous contrast. Multiplanar CT image reconstructions and MIPs were obtained to evaluate the vascular anatomy. CONTRAST:  36mLynMarylyn Ishih4.4ErQueRenaldo Fidd55meLynMarylyn Ishih4.4ErQueRenaldo Fidd75meLynMarylyn Ishih4.4ErQueRenaldo Fidd59meLynMarylyn Ishih4.4ErQueRenaldo Fidd26meLynMarylyn Ishih4.4ErQueRenaldo Fidd72meLynMarylyn Ishih4.4ErQueRenaldo Fidd3meLynMarylyn Ishih4.4ErQueRenaldo Fidd50meLynMarylyn Ishih4.4ErQueRenaldo Fidd70meLynMarylyn Ishih4.4ErQueRenaldo Fidd57meLynMarylyn Ishih4.4ErQueRenaldo Fidd77meLynMarylyn Ishih4.4ErQueRenaldo Fidd29meLynMarylyn Ishih4.4ErQueRenaldo Fidd20meLynMarylyn Ishih4.4ErQueRenaldo Fidd81meLynMarylyn Ishih4.4ErQueRenaldo Fidd2meLynMarylyn Ishih4.4ErQueRenaldo Fidd36meLynMarylyn Ishih4.4ErQueRenaldo Fidd26meLynMarylyn Ishih4.4ErQueRenaldo Fiddlern Mullingrporation 350 MG/ML SOLN COMPARISON:  CTA chest dated Feb 06, 2019. FINDINGS: Cardiovascular: Poor opacification of the pulmonary arteries with non-diagnostic evaluation of the  lobar and segmental pulmonary arteries. No central pulmonary embolism. Unchanged mild dilatation of the main pulmonary artery measuring up to 3.6 cm in diameter. Unchanged borderline cardiomegaly. No pericardial effusion. No thoracic aortic aneurysm or dissection. Mediastinum/Nodes: No enlarged mediastinal, hilar, or axillary lymph nodes. Thyroid gland, trachea, and esophagus demonstrate no significant findings. Lungs/Pleura: Previously seen multifocal ground-glass opacities throughout both lungs have resolved. No focal consolidation, pleural effusion, or pneumothorax. Dependent subsegmental atelectasis in both lungs. Upper Abdomen: No acute abnormality. Musculoskeletal: No chest wall abnormality. No acute or significant osseous  findings. Review of the MIP images confirms the above findings. IMPRESSION: 1. Poor opacification of the pulmonary arteries with non-diagnostic evaluation of the lobar and segmental pulmonary arteries. No central pulmonary embolism. Consider V/Q scan for further evaluation if there is continued concern for pulmonary embolism. 2. Resolved multilobar pneumonia. Electronically Signed   By: Titus Dubin M.D.   On: 02/26/2019 12:37   Nm Pulmonary Perfusion  Result Date: 02/26/2019 CLINICAL DATA:  Shortness of breath, productive cough, recent diagnosis of pulmonary embolism by CTA chest exam on 01/21/2019 EXAM: NUCLEAR MEDICINE PERFUSION LUNG SCAN TECHNIQUE: Perfusion images were obtained in multiple projections after intravenous injection of radiopharmaceutical. Ventilation scans intentionally deferred if perfusion scan and chest x-ray adequate for interpretation during COVID 19 epidemic. RADIOPHARMACEUTICALS:  1.8 mCi Tc-33m MAA IV COMPARISON:  None Correlation: CTA chest exams 02/26/2019, 02/06/2019, 01/21/2019 FINDINGS: Ventilation exam not performed. Perfusion images demonstrate normal tracer distribution in the lungs bilaterally. No segmental or subsegmental perfusion defects identified. Specifically, no evidence of pulmonary emboli in the lower lobes as noted on an earlier CT a exam. IMPRESSION: Normal perfusion lung scan. Electronically Signed   By: Lavonia Dana M.D.   On: 02/26/2019 17:38    Anti-infectives: Anti-infectives (From admission, onward)   None       Assessment/Plan A fib Hx of PE (01/21/19) on Coumadin (held since 6/3) Morbid Obesity (BMI 55.09) DM2 HTN AKI - resolved   GI Bleed Anemia Internal/External Hemorrhoids on Colonoscopy  - EGD benign and without signs or cause of bleeding - No indication for urgent/emergent surgery at this time.  - Trend hgb. Hgb 11.6 on admission. Trend from 6/8-6/10 (10.3>9.4>8.8). Lab work pending this AM. BP 114/92. No tachycardia  - Sitz  baths TID, before and after BM's - Stool softeners to avoid constipation - Hold anticoagulation - Mobilize and IS - We will continue to follow   ID - None VTE - SCDs, Mobilize FEN - HH/Carb Modified, bowel regimen    LOS: 7 days    Jillyn Ledger , [facility] Surgery 02/28/2019, 8:10 AM Pager: [phone]

## 2019-02-28 NOTE — Discharge Instructions (Signed)
How to Take a Sitz Bath  A sitz bath is a warm water bath that may be used to care for your rectum, genital area, or the area between your rectum and genitals (perineum). For a sitz bath, the water only comes up to your hips and covers your buttocks. A sitz bath may done at home in a bathtub or with a portable sitz bath that fits over the toilet.  Your health care provider may recommend a sitz bath to help:   Relieve pain and discomfort after delivering a baby.   Relieve pain and itching from hemorrhoids or anal fissures.   Relieve pain after certain surgeries.   Relax muscles that are sore or tight.  How to take a sitz bath  Take 3-4 sitz baths a day, or as many as told by your health care provider.  Bathtub sitz bath  To take a sitz bath in a bathtub:  1. Partially fill a bathtub with warm water. The water should be deep enough to cover your hips and buttocks when you are sitting in the tub.  2. If your health care provider told you to put medicine in the water, follow his or her instructions.  3. Sit in the water.  4. Open the tub drain a little, and leave it open during your bath.  5. Turn on the warm water again, enough to replace the water that is draining out. Keep the water running throughout your bath. This helps keep the water at the right level and the right temperature.  6. Soak in the water for 15-20 minutes, or as long as told by your health care provider.  7. When you are done, be careful when you stand up. You may feel dizzy.  8. After the sitz bath, pat yourself dry. Do not rub your skin to dry it.    Over-the-toilet sitz bath  To take a sitz bath with an over-the-toilet basin:  1. Follow the manufacturer's instructions.  2. Fill the basin with warm water.  3. If your health care provider told you to put medicine in the water, follow his or her instructions.  4. Sit on the seat. Make sure the water covers your buttocks and perineum.  5. Soak in the water for 15-20 minutes, or as long as told by  your health care provider.  6. After the sitz bath, pat yourself dry. Do not rub your skin to dry it.  7. Clean and dry the basin between uses.  8. Discard the basin if it cracks, or according to the manufacturer's instructions.  Contact a health care provider if:   Your symptoms get worse. Do not continue with sitz baths if your symptoms get worse.   You have new symptoms. If this happens, do not continue with sitz baths until you talk with your health care provider.  Summary   A sitz bath is a warm water bath in which the water only comes up to your hips and covers your buttocks.   A sitz bath may help relieve itching, relieve pain, and relax muscles that are sore or tight in the lower part of your body, including your genital area.   Take 3-4 sitz baths a day, or as many as told by your health care provider. Soak in the water for 15-20 minutes.   Do not continue with sitz baths if your symptoms get worse.  This information is not intended to replace advice given to you by your health care provider. Make   sure you discuss any questions you have with your health care provider.  Document Released: 05/28/2004 Document Revised: 09/07/2017 Document Reviewed: 09/07/2017  Elsevier Interactive Patient Education  2019 Elsevier Inc.      Hemorrhoids  Hemorrhoids are swollen veins in and around the rectum or anus. There are two types of hemorrhoids:   Internal hemorrhoids. These occur in the veins that are just inside the rectum. They may poke through to the outside and become irritated and painful.   External hemorrhoids. These occur in the veins that are outside the anus and can be felt as a painful swelling or hard lump near the anus.  Most hemorrhoids do not cause serious problems, and they can be managed with home treatments such as diet and lifestyle changes. If home treatments do not help the symptoms, procedures can be done to shrink or remove the hemorrhoids.  What are the causes?  This condition is caused  by increased pressure in the anal area. This pressure may result from various things, including:   Constipation.   Straining to have a bowel movement.   Diarrhea.   Pregnancy.   Obesity.   Sitting for long periods of time.   Heavy lifting or other activity that causes you to strain.   Anal sex.   Riding a bike for a long period of time.  What are the signs or symptoms?  Symptoms of this condition include:   Pain.   Anal itching or irritation.   Rectal bleeding.   Leakage of stool (feces).   Anal swelling.   One or more lumps around the anus.  How is this diagnosed?  This condition can often be diagnosed through a visual exam. Other exams or tests may also be done, such as:   An exam that involves feeling the rectal area with a gloved hand (digital rectal exam).   An exam of the anal canal that is done using a small tube (anoscope).   A blood test, if you have lost a significant amount of blood.   A test to look inside the colon using a flexible tube with a camera on the end (sigmoidoscopy or colonoscopy).  How is this treated?  This condition can usually be treated at home. However, various procedures may be done if dietary changes, lifestyle changes, and other home treatments do not help your symptoms. These procedures can help make the hemorrhoids smaller or remove them completely. Some of these procedures involve surgery, and others do not. Common procedures include:   Rubber band ligation. Rubber bands are placed at the base of the hemorrhoids to cut off their blood supply.   Sclerotherapy. Medicine is injected into the hemorrhoids to shrink them.   Infrared coagulation. A type of light energy is used to get rid of the hemorrhoids.   Hemorrhoidectomy surgery. The hemorrhoids are surgically removed, and the veins that supply them are tied off.   Stapled hemorrhoidopexy surgery. The surgeon staples the base of the hemorrhoid to the rectal wall.  Follow these instructions at home:  Eating  and drinking     Eat foods that have a lot of fiber in them, such as whole grains, beans, nuts, fruits, and vegetables.   Ask your health care provider about taking products that have added fiber (fiber supplements).   Reduce the amount of fat in your diet. You can do this by eating low-fat dairy products, eating less red meat, and avoiding processed foods.   Drink enough fluid to keep your   urine pale yellow.  Managing pain and swelling     Take warm sitz baths for 20 minutes, 3-4 times a day to ease pain and discomfort. You may do this in a bathtub or using a portable sitz bath that fits over the toilet.   If directed, apply ice to the affected area. Using ice packs between sitz baths may be helpful.  ? Put ice in a plastic bag.  ? Place a towel between your skin and the bag.  ? Leave the ice on for 20 minutes, 2-3 times a day.  General instructions   Take over-the-counter and prescription medicines only as told by your health care provider.   Use medicated creams or suppositories as told.   Get regular exercise. Ask your health care provider how much and what kind of exercise is best for you. In general, you should do moderate exercise for at least 30 minutes on most days of the week (150 minutes each week). This can include activities such as walking, biking, or yoga.   Go to the bathroom when you have the urge to have a bowel movement. Do not wait.   Avoid straining to have bowel movements.   Keep the anal area dry and clean. Use wet toilet paper or moist towelettes after a bowel movement.   Do not sit on the toilet for long periods of time. This increases blood pooling and pain.   Keep all follow-up visits as told by your health care provider. This is important.  Contact a health care provider if you have:   Increasing pain and swelling that are not controlled by treatment or medicine.   Difficulty having a bowel movement, or you are unable to have a bowel movement.   Pain or inflammation  outside the area of the hemorrhoids.  Get help right away if you have:   Uncontrolled bleeding from your rectum.  Summary   Hemorrhoids are swollen veins in and around the rectum or anus.   Most hemorrhoids can be managed with home treatments such as diet and lifestyle changes.   Taking warm sitz baths can help ease pain and discomfort.   In severe cases, procedures or surgery can be done to shrink or remove the hemorrhoids.  This information is not intended to replace advice given to you by your health care provider. Make sure you discuss any questions you have with your health care provider.  Document Released: 09/02/2000 Document Revised: 01/25/2018 Document Reviewed: 01/25/2018  Elsevier Interactive Patient Education  2019 Elsevier Inc.

## 2019-02-28 NOTE — Care Management (Signed)
Possible discharge tomorrow 03/01/19. Entered in [facility] , pharmacy changed to [facility]. Will follow for co pay over rides.   Magdalen Spatz RN [phone]

## 2019-02-28 NOTE — TOC Initial Note (Signed)
Transition of Care [facility]) - Initial/Assessment Note    Patient Details  Name: Benjamin [name] MRN: [id number] Date of Birth: 09-Mar-1966  [facility]) CM/SW Contact:    Alexander Mt, [place] Phone Number: 02/28/2019, 11:39 AM  Clinical Narrative:                 CSW f/u with [facility] after conversation with pt in order to ensure f/u services arranged. Pt spoke with Awilda Bill at [facility]. Confirmed f/u appointment with staff. Transferred to pt case manager Cleone Slim who updated CSW that pt would be transferred to probation custody tomorrow and therefore would need assistance with medications. RNCM Heather aware, will f/u for dc.   Only request from [facility] for return is that pt negative COVID results are sent with him in his dc packet.   Expected Discharge Plan: Home/Self Care Barriers to Discharge: Continued Medical Work up   Patient Goals and CMS Choice Patient states their goals for this hospitalization and ongoing recovery are:: to get out of the hospital      Expected Discharge Plan and Services Expected Discharge Plan: Home/Self Care In-house Referral: PCP / [facility], Clinical Social Work Discharge Planning Services: [facility], Medication Assistance, Follow-up appt scheduled Post Acute Care Choice: NA Living arrangements for the past 2 months: [employer]) Expected Discharge Date: 02/25/19                                    Prior Living Arrangements/Services Living arrangements for the past 2 months: [employer]) Lives with:: Facility Resident Patient language and need for interpreter reviewed:: Yes(no needs) Do you feel safe going back to the place where you live?: Yes      Need for Family Participation in Patient Care: No (Comment) Care giver support system in place?: Yes (comment)(halfway house staff)   Criminal Activity/Legal Involvement Pertinent to Current Situation/Hospitalization: No - Comment as needed(pt  from halfway house; federal custody until 6/12)  Activities of Daily Living Home Assistive Devices/Equipment: None ADL Screening (condition at time of admission) Patient's cognitive ability adequate to safely complete daily activities?: Yes Is the patient deaf or have difficulty hearing?: No Does the patient have difficulty seeing, even when wearing glasses/contacts?: No Does the patient have difficulty concentrating, remembering, or making decisions?: No Patient able to express need for assistance with ADLs?: Yes Does the patient have difficulty dressing or bathing?: No Independently performs ADLs?: Yes (appropriate for developmental age) Does the patient have difficulty walking or climbing stairs?: No Weakness of Legs: None Weakness of Arms/Hands: None  Permission Sought/Granted Permission sought to share information with : Chartered certified accountant granted to share information with : Yes, Verbal Permission Granted     Permission granted to share info w AGENCY: [employer]        Emotional Assessment Appearance:: Other (Comment Required(spoke with pt on phone) Attitude/Demeanor/Rapport: (spoke with pt on phone) Affect (typically observed): (spoke with pt on phone) Orientation: : Oriented to Place, Oriented to Self, Oriented to Situation, Oriented to  Time Alcohol / Substance Use: Not Applicable Psych Involvement: No (comment)  Admission diagnosis:  Rectal bleeding [K62.5] Patient Active Problem List   Diagnosis Date Noted  . Rectal bleeding 02/20/2019  . Dyspnea 02/06/2019  . Bilateral lower extremity edema   . Obesity, Class III, BMI 40-49.9 (morbid obesity) ([place])   . Acute on chronic diastolic CHF (congestive heart  failure) (HCC)   . PAF (paroxysmal atrial fibrillation) (HCC)   . Acute pulmonary embolism (HCC) 01/21/2019  . Hypertensive urgency 01/21/2019  . Diabetes mellitus type 2 in obese (HCC) 01/21/2019   PCP:  Claiborne Rigg,  NP Pharmacy:   [facility] [phone] - [place] Otto, [place] - [address] AT [facility] OF [address] [facility] [name] [place] [address] Phone: [phone] Fax: [phone]  [facility] - [place], [place] - [place] [address] [place] [address] Phone: [phone] Fax: [phone]     Social Determinants of Health (SDOH) Interventions    Readmission Risk Interventions No flowsheet data found.

## 2019-03-01 DIAGNOSIS — I2609 Other pulmonary embolism with acute cor pulmonale: Secondary | ICD-10-CM

## 2019-03-01 LAB — CBC
HCT: 28.4 % — ABNORMAL LOW (ref 39.0–52.0)
Hemoglobin: 8.4 g/dL — ABNORMAL LOW (ref 13.0–17.0)
MCH: 24 pg — ABNORMAL LOW (ref 26.0–34.0)
MCHC: 29.6 g/dL — ABNORMAL LOW (ref 30.0–36.0)
MCV: 81.1 fL (ref 80.0–100.0)
Platelets: 320 10*3/uL (ref 150–400)
RBC: 3.5 MIL/uL — ABNORMAL LOW (ref 4.22–5.81)
RDW: 15.6 % — ABNORMAL HIGH (ref 11.5–15.5)
WBC: 10.5 10*3/uL (ref 4.0–10.5)
nRBC: 0.2 % (ref 0.0–0.2)

## 2019-03-01 LAB — GLUCOSE, CAPILLARY
Glucose-Capillary: 143 mg/dL — ABNORMAL HIGH (ref 70–99)
Glucose-Capillary: 131 mg/dL — ABNORMAL HIGH (ref 70–99)

## 2019-03-01 LAB — BASIC METABOLIC PANEL
Anion gap: 9 (ref 5–15)
BUN: 16 mg/dL (ref 6–20)
CO2: 31 mmol/L (ref 22–32)
Calcium: 8.8 mg/dL — ABNORMAL LOW (ref 8.9–10.3)
Chloride: 97 mmol/L — ABNORMAL LOW (ref 98–111)
Creatinine, Ser: 1.23 mg/dL (ref 0.61–1.24)
GFR calc Af Amer: >60 mL/min (ref >60)
GFR calc non Af Amer: >60 mL/min (ref >60)
Glucose, Bld: 163 mg/dL — ABNORMAL HIGH (ref 70–99)
Potassium: 4 mmol/L (ref 3.5–5.1)
Sodium: 137 mmol/L (ref 135–145)

## 2019-03-01 LAB — PROTIME-INR
INR: 1.1 (ref 0.8–1.2)
Prothrombin Time: 14.1 seconds (ref 11.4–15.2)

## 2019-03-01 LAB — HEMOGLOBIN AND HEMATOCRIT, BLOOD
HCT: 27.5 % — ABNORMAL LOW (ref 39.0–52.0)
Hemoglobin: 8.5 g/dL — ABNORMAL LOW (ref 13.0–17.0)

## 2019-03-01 MED ORDER — POLYETHYLENE GLYCOL 3350 17 G PO PACK: 17.0000 g | PACK | Freq: Every day | ORAL | 0 refills | Status: DC | PRN

## 2019-03-01 MED ORDER — SODIUM CHLORIDE 0.9 % IV SOLN
510.0000 mg | Freq: Once | INTRAVENOUS | Status: AC
  Administered 2019-03-01: 11:00:00 510 mg via INTRAVENOUS
  Filled 2019-03-01: qty 17

## 2019-03-01 MED ORDER — SODIUM CHLORIDE 0.9 % IV SOLN: 100.0000 mg | Freq: Once | INTRAVENOUS | Status: DC

## 2019-03-01 MED ORDER — DOCUSATE SODIUM 100 MG PO CAPS: 100.0000 mg | ORAL_CAPSULE | Freq: Two times a day (BID) | ORAL | 2 refills | Status: DC

## 2019-03-01 MED FILL — DOK 100 MG CAPS: 100 | 30 days supply | Qty: 60 | Fill #0

## 2019-03-01 MED FILL — POLYETHYLENE GLYCOL 3350 PO: 17 | 14 days supply | Qty: 238 | Fill #0

## 2019-03-01 NOTE — Progress Notes (Addendum)
    4 Days Post-Op  Subjective: CC: Bloody Bm's.  Reports abdominal pain has resolved. Had 1 BM yesterday with a mild amount of "pink blood in the water" yesterday. No BM's since. Tolerating diet without any N/V. Denies any lightheadedness. Did sitz baths twice yesterday.  Objective: Vital signs in last 24 hours: Temp:  [97.7 F (36.5 C)-98.6 F (37 C)] 97.7 F (36.5 C) (06/12 0608) Pulse Rate:  [85-94] 94 (06/12 0608) Resp:  [14-18] 18 (06/12 0608) BP: (97-154)/(43-93) 154/93 (06/12 0608) SpO2:  [93 %-96 %] 96 % (06/12 0608) Last BM Date: 02/27/19  Intake/Output from previous day: 06/11 0701 - 06/12 0700 In: 720 [P.O.:720] Out: -  Intake/Output this shift: No intake/output data recorded.  PE: Gen: Awake and alert, NAD Lungs: Normal rate and effort  Abd: Soft, ND, NTwithout r/r/g. +BS Skin: Warm and dry  Lab Results:  Recent Labs    02/28/19 0819 02/28/19 1817 03/01/19 0420  WBC 10.0  --  10.5  HGB 8.9* 8.5* 8.4*  HCT 29.9* 28.2* 28.4*  PLT 351  --  320   BMET Recent Labs    02/27/19 0702 03/01/19 0420  NA 137 137  K 4.0 4.0  CL 99 97*  CO2 29 31  GLUCOSE 127* 163*  BUN 16 16  CREATININE 1.08 1.23  CALCIUM 8.7* 8.8*   PT/INR Recent Labs    02/28/19 0819 03/01/19 0420  LABPROT 14.6 14.1  INR 1.2 1.1   CMP     Component Value Date/Time   NA 137 03/01/2019 0420   K 4.0 03/01/2019 0420   CL 97 (L) 03/01/2019 0420   CO2 31 03/01/2019 0420   GLUCOSE 163 (H) 03/01/2019 0420   BUN 16 03/01/2019 0420   CREATININE 1.23 03/01/2019 0420   CALCIUM 8.8 (L) 03/01/2019 0420   PROT 7.0 02/27/2019 0702   ALBUMIN 3.4 (L) 02/27/2019 0702   AST 22 02/27/2019 0702   ALT 22 02/27/2019 0702   ALKPHOS 49 02/27/2019 0702   BILITOT 0.3 02/27/2019 0702   GFRNONAA >60 03/01/2019 0420   GFRAA >60 03/01/2019 0420   Lipase     Component Value Date/Time   LIPASE 23 02/20/2019 1801       Studies/Results: No results found.  Anti-infectives:  Anti-infectives (From admission, onward)   None       Assessment/Plan A fib Hx of PE (01/21/19) on Coumadin (held since 6/3) Morbid Obesity (BMI 55.09) DM2 HTN AKI - resolved   GI Bleed Anemia Internal/External Hemorrhoids on Colonoscopy  - EGD benign and without signs or cause of bleeding - No indication for urgent/emergent surgery at this time.  - Trend hgb. Hgb 11.6 on admission. Trend from 6/8-6/12 (10.3>8.8>8.9>8.4). One episode of hypotension overnight. None currently. No tachycardia  - Sitz baths TID, before and after BM's - Stool softeners to avoid constipation - Hold anticoagulation - Patient reports less frequent BM's and less bleeding - Will arrange follow up with one of the colorectal surgeons  ID -None VTE -SCDs, Mobilize FEN -HH/Carb Modified, bowel regimen Follow-Up: Colorectal surgeon in office   LOS: 8 days    Jillyn Ledger , [facility] 03/01/2019, 8:38 AM Pager: [phone]

## 2019-03-01 NOTE — Discharge Summary (Signed)
Physician Discharge Summary  DODD SCHMID UJW:[id number] DOB: 1965-12-09 DOA: 02/20/2019  PCP: [name], NP  Admit date: 02/20/2019 Discharge date: 03/01/2019  Time spent: 40 minutes  Recommendations for Outpatient Follow-up:  1. Follow-up with primary care physician in 1 week 2. Follow-up with the general surgery as scheduled    Discharge Diagnoses:  Principal Problem:   Rectal bleeding Active Problems:   Acute pulmonary embolism ([place])   Diabetes mellitus type 2 in obese (HCC)   PAF (paroxysmal atrial fibrillation) ([place])   Discharge Condition: Stable  Diet recommendation: Cardiac  Filed Weights   02/20/19 1226  Weight: (!) 179.2 kg    History of present illness and Hospital Course:  Benjamin [name] a 53 y.o.malewith medical history significant ofPAF on Coumadin, DM type II, hypertension, morbid obesity, andpulmonary emboluson5/4;who presents with reports of blood mixed in his stools. Blood was bright red in color and filled the toilet bowl. He reports having 5 similar bowel movements since onset of symptoms. Associated symptoms include right lower quadrant abdominal pain and soreness. States he was changed by his primary care provider to Xarelto due to his recent COVID-19 status to reduce need of INR checked,but just received a prescription today in the mail.  Admitted into the hospital from 5/4-5/7;diagnosed with bilateral pulmonary embolism where his INR was found to be subtherapeutic. It was discussed changing him to to 1 of the newer agents, but patient not a good candidate due to his weight. He was admitted into the hospital on 5/23-526 for shortness of breath found to be COVID-19positive at that time. Repeat CT angiogram of the chest on 5/20, was of poor quality and. Showed multiple opacities concerning for viral pneumonia, but no clear signs of PE. Denies having any cough or shortness of breath or fever since that time.  Patient continued  to have multiple bowel movements.  Patient underwent EGD, colonoscopy did not show any source of bleeding liver was found to have internal, external hemorrhoids.  General surgery was consulted for surgical intervention, however recommended to continue with conservative management with rectal suppositories, sitz bath.  Patient's hemoglobin trended down to 8.5, remained stable.  Patient is discharged with oral iron, was also given 1 dose of IV iron prior to discharge.  Patient is not started on anticoagulation considering patient's recent bleeding.  Patient is recommended to follow-up with primary care physician in 1 week.  Patient is also recommended to follow-up with general surgery as scheduled.  Recommended patient to return to emergency department if develops large amount of blood in the stool.  Expressed understanding.  Also emphasized the importance of losing weight with a diet and exercise, expressed understanding.  ED Course:On admission to the emergency department patient was seen to beafebrile,respirations 11-24, blood pressure elevated to 168/92, and O2 saturation maintained on room air. Labs revealed WBC 11.6 with low MCV, platelets 413, INR 1.9, and CMP within normal limits. No imaging studies were performed. TRH called to admit into the hospital.  Assessment & Plan:   Principal Problem:   Rectal bleeding Active Problems:   Acute pulmonary embolism (HCC)   Diabetes mellitus type 2 in obese (HCC)   PAF (paroxysmal atrial fibrillation) ([place])   ##GI bleed  -patient presents with reports of hematochezia.  -S/p EGD and colonoscopy by GI notable for external/internal hemorrhoids -Anticoagulation placed on hold again -Anusol -Patient continued to have bloody stools.  Stool in the commode is seen by me.  Looks dark, loose stools. - consulted surgery  due to persistent bleeding -> recommended to continue with medical management with the sitz bath's, Anusol suppositories, prevent  constipation. -Also concern about if patient has small bowel bleed. -Patient did not have any episodes since last night  ##Acute blood loss anemia -Admitted with hemoglobin of 11.6 -Trended down to 9.2 > 8.8> 8.9  ##Recent history of PE:  -Patient diagnosed with a pulmonary embolus on 5/4where INR was subtherapetic. Repeat CT angiogram of the chest a poor quality on 5/20 when he came in positive for COVID, but no acute pulmonary embolus is appreciated. Patient's primary had tried to start him on Xarelto to reduce need for INR checks. - Pt with recent PE in 01/2019.  Seems to have been unprovoked (he denies travel, injury, surgery, malignancy - he does not his mother had hx of VTE, though he's unable to expand upon her situation).   - He has negative VQ scan (CT non conclusive) and LE [place].   - Can continue to hold anticoagulation as no evidence of VTE at this time, but with what appears to be unprovoked PE in May, I'd favor continuing anticoagulation after his bleeding is addressed/resolved (pt also has history of afib, which is another indication for anticoagulation).  Would use warfarin rather than doac given lack of evidence in morbid obesity.  ##Recent COVID 19 infection:  -pt now testing negative on 6/3.  He notes he's been asx for 7+ days.    ##OHS O2 requirement: - started on oxygen at most recent admission.  Appears to continue to need this.  Likely 2/2 OHS. -Likely needs outpatient sleep study at some time as well -Needs home O2 - See home O2 screen from 6/8 at 531 PM  ##Paroxysmal atrial fibrillation: Patient appears to be in sinus rhythm at this time.CHA2DS2-VASc score =3 (HTN, DM, HF).  - Continue Labetalol - warfarin on hold  ##Subtherapeutic INR:  ##Essential Hypertension: stable. -Continuelabetalol, amlodipine, lisinopril  ##Abdominal pain: Right lower quadrant abdominal painappears secondary to Lovenox injections. -Continue to monitor  ##Diabetes  Mellitus type 2: Uncontrolled. HbgA1c 7.6 - Hypoglycemic protocol - hold metformin - CBG q AC with Moderate SSI  ##Acute Kidney Injury:  -f improved holding lisinopril/spironolactone.   -Creatinine fluctuating over past several days.  Follow.   ##Morbid obesity:  -BMI55 kg/m -Counseling done regarding diet and exercise  Procedures: EGD, colonoscopy   Consultations:  Gastroenterology  General surgery  Discharge Exam: Vitals:   02/28/19 1938 03/01/19 0608  BP: 138/87 (!) 154/93  Pulse: 86 94  Resp: 16 18  Temp: 98.6 F (37 C) 97.7 F (36.5 C)  SpO2: 93% 96%    General: No acute distress. Cardiovascular: Heart sounds show a regular rate, and rhythm. Lungs: Clear to auscultation  Abdomen: Soft, nontender, nondistended Neurological: Alert and oriented 3. Moves all extremities 4. Cranial nerves II through XII grossly intact. Skin: Warm and dry. No rashes or lesions. Extremities: No clubbing or cyanosis. No edema.  Psychiatric: Mood and affect are normal. Insight and judgment are appropriate. Discharge Instructions   Discharge Instructions    Call MD for:  difficulty breathing, headache or visual disturbances   Complete by: As directed    Call MD for:  hives   Complete by: As directed    Call MD for:  persistant dizziness or light-headedness   Complete by: As directed    Call MD for:  persistant nausea and vomiting   Complete by: As directed    Call MD for:  redness, tenderness, or signs  of infection (pain, swelling, redness, odor or green/yellow discharge around incision site)   Complete by: As directed    Call MD for:  severe uncontrolled pain   Complete by: As directed    Call MD for:  temperature >100.4   Complete by: As directed    Diet - low sodium heart healthy   Complete by: As directed    Diet - low sodium heart healthy   Complete by: As directed    Discharge instructions   Complete by: As directed    You were seen for a GI bleed.  This was  due to hemorrhoids.  We've prescribed anusol for you to use for the next week.   You can resume warfarin.  Please stop the xarelto.  You need a follow up appointment with your PCP within the next 3 days for repeat INR check (you'll need close follow up for the next week or so to follow your INR and adjust your warfarin dose as necessary).  You'll probably need an INR check no later than this Thursday and then again next Monday based on how you're doing.  If you have worsening or continued bleeding, stop the warfarin and follow with your primary doctor.  You've been started on iron for your anemia.  You'll need follow up iron studies and follow up blood counts.   You need a repeat colonoscopy in 10 years.    You had a Sars CoV 2 test (COVID 19 test) on 6/3 which was negative.  Return for new, recurrent, or worsening symptoms.  Please ask your PCP to request records from this hospitalization so they know what was done and what the next steps will be.   Discharge instructions   Complete by: As directed    Follow-up with gastroenterology in 1 to 2 weeks Follow-up with general surgery in 1 to 2 weeks Return to emergency department if you develop large amount of blood blood in the stool   Increase activity slowly   Complete by: As directed    Increase activity slowly   Complete by: As directed      Allergies as of 03/01/2019   No Known Allergies     Medication List    STOP taking these medications   rivaroxaban 20 MG Tabs tablet Commonly known as: Xarelto   warfarin 6 MG tablet Commonly known as: COUMADIN     TAKE these medications   amLODipine 10 MG tablet Commonly known as: NORVASC Take 10 mg by mouth daily.   atorvastatin 40 MG tablet Commonly known as: LIPITOR Take 40 mg by mouth at bedtime.   carboxymethylcellulose 0.5 % Soln Commonly known as: REFRESH PLUS Place 1 drop into both eyes daily as needed (dry eyes).   COUGH DROPS MT 1 drop as needed (for throat  dryness).   docusate sodium 100 MG capsule Commonly known as: COLACE Take 1 capsule (100 mg total) by mouth 2 (two) times daily.   ferrous sulfate 325 (65 FE) MG tablet Take 1 tablet (325 mg total) by mouth daily for 30 days.   furosemide 80 MG tablet Commonly known as: LASIX Take 80 mg by mouth 3 (three) times daily.   hydrocortisone 25 MG suppository Commonly known as: ANUSOL-HC Place 1 suppository (25 mg total) rectally 2 (two) times daily for 7 days.   labetalol 200 MG tablet Commonly known as: NORMODYNE Take 200 mg by mouth daily.   lisinopril 40 MG tablet Commonly known as: ZESTRIL Take 40 mg by mouth  daily.   metFORMIN 500 MG tablet Commonly known as: GLUCOPHAGE Take 500 mg by mouth 2 (two) times a day.   methocarbamol 500 MG tablet Commonly known as: ROBAXIN Take 1 tablet (500 mg total) by mouth every 8 (eight) hours as needed for muscle spasms.   omeprazole 40 MG capsule Commonly known as: PRILOSEC Take 40 mg by mouth daily before breakfast.   polyethylene glycol 17 g packet Commonly known as: MIRALAX / GLYCOLAX Take 17 g by mouth daily as needed for severe constipation.   spironolactone 50 MG tablet Commonly known as: ALDACTONE Take 50 mg by mouth daily.   triamcinolone cream 0.1 % Commonly known as: KENALOG Apply 1 application topically 3 (three) times daily.      No Known Allergies Follow-up Information    Claiborne Rigg, NP Follow up.   Specialty: Nurse Practitioner Why: Please call for a follow up appointment for an INR check within 3 days Contact information: [address] [place] [address] [phone]        [facility]. Go on 03/11/2019.   Why: appointment is at 10:50am Contact information: [address] [place] [address] [phone]       Sur[facility], [place]. Call.   Specialty: General Surgery Why: To confirm you appointment time  Contact  information: [address] [place] [address] [phone]            The results of significant diagnostics from this hospitalization (including imaging, microbiology, ancillary and laboratory) are listed below for reference.    Significant Diagnostic Studies: Dg Chest 2 View  Result Date: 02/26/2019 CLINICAL DATA:  Fatigue, short of breath EXAM: CHEST - 2 VIEW COMPARISON:  CT 02/06/2019, chest radiograph 02/06/2019 FINDINGS: Normal cardiac silhouette. A subtle airspace density in the RIGHT lower lobe again noted. Otherwise lungs are clear. No effusion. IMPRESSION: Persistent RIGHT lower lobe pneumonia.  No worsening. Electronically Signed   By: Genevive Bi M.D.   On: 02/26/2019 10:39   Dg Chest 2 View  Result Date: 02/06/2019 CLINICAL DATA:  Shortness of breath and syncope EXAM: CHEST - 2 VIEW COMPARISON:  Chest radiograph and chest CT Jan 21, 2019 FINDINGS: There is no appreciable edema or consolidation. Heart is slightly enlarged with mild pulmonary venous hypertension. No adenopathy. There is aortic atherosclerosis. No evident bone lesions. IMPRESSION: Pulmonary vascular congestion. No frank edema or consolidation. Aortic Atherosclerosis (ICD10-I70.0). Electronically Signed   By: Bretta Bang III M.D.   On: 02/06/2019 13:05   Ct Head Wo Contrast  Result Date: 02/06/2019 CLINICAL DATA:  Syncope. EXAM: CT HEAD WITHOUT CONTRAST TECHNIQUE: Contiguous axial images were obtained from the base of the skull through the vertex without intravenous contrast. COMPARISON:  None. FINDINGS: Brain: No evidence of acute infarction, hemorrhage, hydrocephalus, extra-axial collection or mass lesion/mass effect. Vascular: No hyperdense vessel or unexpected calcification. Skull: Normal. Negative for fracture or focal lesion. Sinuses/Orbits: No acute finding. Other: None. IMPRESSION: No acute intracranial abnormality. Electronically Signed   By: Katherine Mantle M.D.   On: 02/06/2019  20:49   Ct Angio Chest Pe W Or Wo Contrast  Result Date: 02/26/2019 CLINICAL DATA:  Shortness of breath.  History of pulmonary embolism. EXAM: CT ANGIOGRAPHY CHEST WITH CONTRAST TECHNIQUE: Multidetector CT imaging of the chest was performed using the standard protocol during bolus administration of intravenous contrast. Multiplanar CT image reconstructions and MIPs were obtained to evaluate the vascular anatomy. CONTRAST:  80mL OMNIPAQUE IOHEXOL  350 MG/ML SOLN COMPARISON:  CTA chest dated Feb 06, 2019. FINDINGS: Cardiovascular: Poor opacification of the pulmonary arteries with non-diagnostic evaluation of the lobar and segmental pulmonary arteries. No central pulmonary embolism. Unchanged mild dilatation of the main pulmonary artery measuring up to 3.6 cm in diameter. Unchanged borderline cardiomegaly. No pericardial effusion. No thoracic aortic aneurysm or dissection. Mediastinum/Nodes: No enlarged mediastinal, hilar, or axillary lymph nodes. Thyroid gland, trachea, and esophagus demonstrate no significant findings. Lungs/Pleura: Previously seen multifocal ground-glass opacities throughout both lungs have resolved. No focal consolidation, pleural effusion, or pneumothorax. Dependent subsegmental atelectasis in both lungs. Upper Abdomen: No acute abnormality. Musculoskeletal: No chest wall abnormality. No acute or significant osseous findings. Review of the MIP images confirms the above findings. IMPRESSION: 1. Poor opacification of the pulmonary arteries with non-diagnostic evaluation of the lobar and segmental pulmonary arteries. No central pulmonary embolism. Consider V/Q scan for further evaluation if there is continued concern for pulmonary embolism. 2. Resolved multilobar pneumonia. Electronically Signed   By: Obie DredgeWilliam T Derry M.D.   On: 02/26/2019 12:37   Ct Angio Chest Pe W And/or Wo Contrast  Result Date: 02/06/2019 CLINICAL DATA:  Shortness of breath. Recent pulmonary embolus. Subtherapeutic INR.  EXAM: CT ANGIOGRAPHY CHEST WITH CONTRAST TECHNIQUE: Multidetector CT imaging of the chest was performed using the standard protocol during bolus administration of intravenous contrast. Multiplanar CT image reconstructions and MIPs were obtained to evaluate the vascular anatomy. CONTRAST:  100mL OMNIPAQUE IOHEXOL 350 MG/ML SOLN COMPARISON:  CTA chest 01/21/2019 FINDINGS: Cardiovascular: --Pulmonary arteries: Satisfactory contrast bolus. Beam attenuation caused by patient body habitus limits assessment beyond the proximal segmental level.Within that limitation, there is no central pulmonary embolus. The bilateral lower lobe emboli visualized previously are not visible on the current study. The main pulmonary artery is mildly enlarged, measuring 3.6 cm. --Aorta: Limited opacification of the aorta due to bolus timing optimization for the pulmonary arteries. Conventional 3 vessel aortic branching pattern. The aortic course and caliber are normal. There is no aortic atherosclerosis. --Heart: Mild cardiomegaly. No pericardial effusion. Mediastinum/Nodes: No mediastinal, hilar or axillary lymphadenopathy. The visualized thyroid and thoracic esophageal course are unremarkable. Lungs/Pleura: Multifocal ground-glass opacities throughout both lungs. No pleural effusion or pneumothorax. Upper Abdomen: Contrast bolus timing is not optimized for evaluation of the abdominal organs. Within this limitation, the visualized organs of the upper abdomen are normal. Musculoskeletal: No chest wall abnormality. No acute or significant osseous findings. Review of the MIP images confirms the above findings. IMPRESSION: 1. Sensitivity limited by beam attenuation due to body habitus. No central pulmonary embolus. Previously identified emboli are not clearly visible on today's study. 2. Multifocal bilateral ground-glass opacity. This finding may be associated with viral pneumonia (COVID-19 or otherwise). Multifocal small embolic infarcts are  also a consideration. 3. Mild cardiomegaly and enlarged main pulmonary artery, suggesting pulmonary hypertension. Electronically Signed   By: Deatra RobinsonKevin  Herman M.D.   On: 02/06/2019 17:39   Nm Gi Blood Loss  Result Date: 02/23/2019 CLINICAL DATA:  Bright red blood per rectum EXAM: NUCLEAR MEDICINE GASTROINTESTINAL BLEEDING SCAN TECHNIQUE: Sequential abdominal images were obtained following intravenous administration of Tc-4255m labeled red blood cells. RADIOPHARMACEUTICALS:  21.2 mCi Tc-2355m pertechnetate in-vitro labeled red cells. COMPARISON:  None. FINDINGS: Sequential anterior planar scintigraphic images of the abdomen are reviewed through 120 minutes. There is localization of tagged RBCs to blood pool structures and minimal urinary excretion related to free pertechnetate. No evidence of GI lumen localization or peristalsis to suggest nidus of GI bleeding. IMPRESSION: Negative nuclear  scintigraphic GI bleeding examination through 120 minutes. No nidus of GI bleeding identified. Electronically Signed   By: Lauralyn Primes M.D.   On: 02/23/2019 22:21   Nm Pulmonary Perfusion  Result Date: 02/26/2019 CLINICAL DATA:  Shortness of breath, productive cough, recent diagnosis of pulmonary embolism by CTA chest exam on 01/21/2019 EXAM: NUCLEAR MEDICINE PERFUSION LUNG SCAN TECHNIQUE: Perfusion images were obtained in multiple projections after intravenous injection of radiopharmaceutical. Ventilation scans intentionally deferred if perfusion scan and chest x-ray adequate for interpretation during COVID 19 epidemic. RADIOPHARMACEUTICALS:  1.8 mCi Tc-38m MAA IV COMPARISON:  None Correlation: CTA chest exams 02/26/2019, 02/06/2019, 01/21/2019 FINDINGS: Ventilation exam not performed. Perfusion images demonstrate normal tracer distribution in the lungs bilaterally. No segmental or subsegmental perfusion defects identified. Specifically, no evidence of pulmonary emboli in the lower lobes as noted on an earlier CT a exam.  IMPRESSION: Normal perfusion lung scan. Electronically Signed   By: Ulyses Southward M.D.   On: 02/26/2019 17:38   Vas [place] Lower Extremity Venous (dvt)  Result Date: 02/22/2019  Lower Venous Study Indications: Swelling.  Limitations: Body habitus and poor ultrasound/tissue interface. Performing Technologist: Chanda Busing RVT  Examination Guidelines: A complete evaluation includes B-mode imaging, spectral Doppler, color Doppler, and power Doppler as needed of all accessible portions of each vessel. Bilateral testing is considered an integral part of a complete examination. Limited examinations for reoccurring indications may be performed as noted.  +---------+---------------+---------+-----------+----------+-------+ RIGHT    CompressibilityPhasicitySpontaneityPropertiesSummary +---------+---------------+---------+-----------+----------+-------+ CFV      Full           Yes      Yes                          +---------+---------------+---------+-----------+----------+-------+ SFJ      Full                                                 +---------+---------------+---------+-----------+----------+-------+ FV Prox  Full                                                 +---------+---------------+---------+-----------+----------+-------+ FV Mid                  Yes      Yes                          +---------+---------------+---------+-----------+----------+-------+ FV Distal               Yes      Yes                          +---------+---------------+---------+-----------+----------+-------+ PFV      Full                                                 +---------+---------------+---------+-----------+----------+-------+ POP      Full           Yes      Yes                          +---------+---------------+---------+-----------+----------+-------+  PTV      Full                                                  +---------+---------------+---------+-----------+----------+-------+ PERO     Full                                                 +---------+---------------+---------+-----------+----------+-------+   +---------+---------------+---------+-----------+----------+--------------+ LEFT     CompressibilityPhasicitySpontaneityPropertiesSummary        +---------+---------------+---------+-----------+----------+--------------+ CFV      Full           Yes      Yes                                 +---------+---------------+---------+-----------+----------+--------------+ SFJ      Full                                                        +---------+---------------+---------+-----------+----------+--------------+ FV Prox  Full                                                        +---------+---------------+---------+-----------+----------+--------------+ FV Mid                  Yes      Yes                                 +---------+---------------+---------+-----------+----------+--------------+ FV Distal                                             Not visualized +---------+---------------+---------+-----------+----------+--------------+ PFV      Full                                                        +---------+---------------+---------+-----------+----------+--------------+ POP      Full           Yes      Yes                                 +---------+---------------+---------+-----------+----------+--------------+ PTV      Full                                                        +---------+---------------+---------+-----------+----------+--------------+  PERO                                                  Not visualized +---------+---------------+---------+-----------+----------+--------------+     Summary: Right: There is no evidence of deep vein thrombosis in the lower extremity. However, portions of this examination were limited- see  technologist comments above. No cystic structure found in the popliteal fossa. Left: There is no evidence of deep vein thrombosis in the lower extremity. However, portions of this examination were limited- see technologist comments above. No cystic structure found in the popliteal fossa.  *See table(s) above for measurements and observations. Electronically signed by Lemar Livings MD on 02/22/2019 at 4:26:49 PM.    Final     Microbiology: Recent Results (from the past 240 hour(s))  SARS Coronavirus 2     Status: None   Collection Time: 02/20/19  2:36 PM  Result Value Ref Range Status   SARS Coronavirus 2 NOT DETECTED NOT DETECTED Final    Comment: (NOTE) SARS-CoV-2 target nucleic acids are NOT DETECTED. The SARS-CoV-2 RNA is generally detectable in upper and lower respiratory specimens during the acute phase of infection.  Negative  results do not preclude SARS-CoV-2 infection, do not rule out co-infections with other pathogens, and should not be used as the sole basis for treatment or other patient management decisions.  Negative results must be combined with clinical observations, patient history, and epidemiological information. The expected result is Not Detected. Fact Sheet for Patients: http://www.biofiredefense.com/wp-content/uploads/2020/03/BIOFIRE-COVID -19-patients.pdf Fact Sheet for Healthcare Providers: http://www.biofiredefense.com/wp-content/uploads/2020/03/BIOFIRE-COVID -19-hcp.pdf This test is not yet approved or cleared by the [place] and  has been authorized for detection and/or diagnosis of SARS-CoV-2 by FDA under an Emergency Use Authorization (EUA).  This EUA will remain in effec t (meaning this test can be used) for the duration of  the COVID-19 declaration under Section 564(b)(1) of the Act, 21 U.S.C. section 360bbb-3(b)(1), unless the authorization is terminated or revoked sooner. Performed at [facility], [address], [place],  [place] [address]      Labs: Basic Metabolic Panel: Recent Labs  Lab 02/23/19 0357 02/24/19 0405 02/25/19 6045 02/26/19 0250 02/27/19 0702 03/01/19 0420  NA 139 138 138 137 137 137  K 3.7 3.8 3.8 4.5 4.0 4.0  CL 101 100 100 99 99 97*  CO2 29 29 30 29 29 31   GLUCOSE 127* 111* 111* 183* 127* 163*  BUN 12 13 10 16 16 16   CREATININE 1.30* 1.22 1.11 1.37* 1.08 1.23  CALCIUM 8.6* 8.9 8.6* 8.8* 8.7* 8.8*  MG 1.8 1.7 1.6* 1.9 1.7  --    Liver Function Tests: Recent Labs  Lab 02/23/19 0357 02/24/19 0405 02/25/19 0658 02/26/19 0250 02/27/19 0702  AST 19 18 20 21 22   ALT 22 21 21 24 22   ALKPHOS 48 48 45 48 49  BILITOT 0.5 0.3 0.4 0.3 0.3  PROT 7.5 7.4 6.9 7.4 7.0  ALBUMIN 3.5 3.4* 3.4* 3.3* 3.4*   No results for input(s): LIPASE, AMYLASE in the last 168 hours. No results for input(s): AMMONIA in the last 168 hours. CBC: Recent Labs  Lab 02/25/19 0658  02/26/19 0250  02/27/19 0702 02/27/19 1812 02/28/19 0819 02/28/19 1817 03/01/19 0420 03/01/19 0808  WBC 7.4  --  13.2*  --  11.2*  --  10.0  --  10.5  --  HGB 10.1*   < > 9.8*   < > 9.2* 8.8* 8.9* 8.5* 8.4* 8.5*  HCT 34.1*   < > 32.9*   < > 31.4* 29.5* 29.9* 28.2* 28.4* 27.5*  MCV 80.6  --  81.2  --  82.2  --  80.8  --  81.1  --   PLT 353  --  362  --  332  --  351  --  320  --    < > = values in this interval not displayed.   Cardiac Enzymes: No results for input(s): CKTOTAL, CKMB, CKMBINDEX, TROPONINI in the last 168 hours. BNP: BNP (last 3 results) Recent Labs    01/21/19 1704 02/06/19 1158  BNP 23.2 15.8    ProBNP (last 3 results) No results for input(s): PROBNP in the last 8760 hours.  CBG: Recent Labs  Lab 02/28/19 1217 02/28/19 1656 02/28/19 1935 03/01/19 0733 03/01/19 1229  GLUCAP 98 105* 144* 143* 131*       Signed:  Bransyn Adami MD.  [facility] 03/01/2019, 2:56 PM

## 2019-03-01 NOTE — Progress Notes (Signed)
Patient discharged to home. Verbalized understanding of all discharge instructions including, sitz baths, discharge medications and follow up MD visits.

## 2019-03-04 ENCOUNTER — Telehealth: Payer: Self-pay | Admitting: Nurse Practitioner

## 2019-03-04 NOTE — Telephone Encounter (Signed)
Patient called to speak with PCP states that you will need to call and ask to speak with him. Please follow up.

## 2019-03-08 NOTE — Telephone Encounter (Signed)
PCP spoke to patient

## 2019-03-10 NOTE — Telephone Encounter (Signed)
Patient is aware to restart xarelto 20mg  daily and dc coumadin.

## 2019-03-11 ENCOUNTER — Emergency Department (HOSPITAL_COMMUNITY): Payer: Medicaid Other

## 2019-03-11 ENCOUNTER — Telehealth: Payer: Self-pay

## 2019-03-11 ENCOUNTER — Other Ambulatory Visit: Payer: Self-pay

## 2019-03-11 ENCOUNTER — Encounter (HOSPITAL_COMMUNITY): Payer: Self-pay | Admitting: Emergency Medicine

## 2019-03-11 ENCOUNTER — Observation Stay (HOSPITAL_COMMUNITY)
Admission: EM | Admit: 2019-03-11 | Discharge: 2019-03-12 | Disposition: A | Discharge status: Home / Self Care | Payer: Medicaid Other | Attending: Family Medicine | Admitting: Family Medicine

## 2019-03-11 ENCOUNTER — Ambulatory Visit: Payer: Self-pay | Attending: Nurse Practitioner | Admitting: Nurse Practitioner

## 2019-03-11 ENCOUNTER — Encounter: Payer: Self-pay | Admitting: Nurse Practitioner

## 2019-03-11 DIAGNOSIS — I2699 Other pulmonary embolism without acute cor pulmonale: Secondary | ICD-10-CM | POA: Diagnosis not present

## 2019-03-11 DIAGNOSIS — E119 Type 2 diabetes mellitus without complications: Secondary | ICD-10-CM | POA: Diagnosis present

## 2019-03-11 DIAGNOSIS — U071 COVID-19: Principal | ICD-10-CM | POA: Insufficient documentation

## 2019-03-11 DIAGNOSIS — I480 Paroxysmal atrial fibrillation: Secondary | ICD-10-CM | POA: Diagnosis not present

## 2019-03-11 DIAGNOSIS — G4733 Obstructive sleep apnea (adult) (pediatric): Secondary | ICD-10-CM | POA: Diagnosis not present

## 2019-03-11 DIAGNOSIS — J9601 Acute respiratory failure with hypoxia: Secondary | ICD-10-CM

## 2019-03-11 DIAGNOSIS — Z79899 Other long term (current) drug therapy: Secondary | ICD-10-CM | POA: Diagnosis not present

## 2019-03-11 DIAGNOSIS — Z87891 Personal history of nicotine dependence: Secondary | ICD-10-CM | POA: Insufficient documentation

## 2019-03-11 DIAGNOSIS — Z8249 Family history of ischemic heart disease and other diseases of the circulatory system: Secondary | ICD-10-CM | POA: Insufficient documentation

## 2019-03-11 DIAGNOSIS — I110 Hypertensive heart disease with heart failure: Secondary | ICD-10-CM | POA: Diagnosis not present

## 2019-03-11 DIAGNOSIS — R778 Other specified abnormalities of plasma proteins: Secondary | ICD-10-CM | POA: Diagnosis present

## 2019-03-11 DIAGNOSIS — Z7984 Long term (current) use of oral hypoglycemic drugs: Secondary | ICD-10-CM | POA: Insufficient documentation

## 2019-03-11 DIAGNOSIS — Z9989 Dependence on other enabling machines and devices: Secondary | ICD-10-CM

## 2019-03-11 DIAGNOSIS — R0789 Other chest pain: Secondary | ICD-10-CM

## 2019-03-11 DIAGNOSIS — I5032 Chronic diastolic (congestive) heart failure: Secondary | ICD-10-CM | POA: Insufficient documentation

## 2019-03-11 DIAGNOSIS — Z6841 Body Mass Index (BMI) 40.0 and over, adult: Secondary | ICD-10-CM | POA: Insufficient documentation

## 2019-03-11 DIAGNOSIS — Z86711 Personal history of pulmonary embolism: Secondary | ICD-10-CM | POA: Diagnosis not present

## 2019-03-11 DIAGNOSIS — E785 Hyperlipidemia, unspecified: Secondary | ICD-10-CM | POA: Diagnosis not present

## 2019-03-11 DIAGNOSIS — Z7901 Long term (current) use of anticoagulants: Secondary | ICD-10-CM | POA: Insufficient documentation

## 2019-03-11 DIAGNOSIS — D509 Iron deficiency anemia, unspecified: Secondary | ICD-10-CM | POA: Diagnosis not present

## 2019-03-11 DIAGNOSIS — E669 Obesity, unspecified: Secondary | ICD-10-CM

## 2019-03-11 DIAGNOSIS — Z20828 Contact with and (suspected) exposure to other viral communicable diseases: Secondary | ICD-10-CM | POA: Diagnosis not present

## 2019-03-11 DIAGNOSIS — E1169 Type 2 diabetes mellitus with other specified complication: Secondary | ICD-10-CM | POA: Diagnosis present

## 2019-03-11 DIAGNOSIS — K219 Gastro-esophageal reflux disease without esophagitis: Secondary | ICD-10-CM | POA: Insufficient documentation

## 2019-03-11 DIAGNOSIS — R600 Localized edema: Secondary | ICD-10-CM

## 2019-03-11 DIAGNOSIS — R0602 Shortness of breath: Secondary | ICD-10-CM | POA: Diagnosis not present

## 2019-03-11 DIAGNOSIS — R7989 Other specified abnormal findings of blood chemistry: Secondary | ICD-10-CM | POA: Diagnosis present

## 2019-03-11 DIAGNOSIS — R0600 Dyspnea, unspecified: Secondary | ICD-10-CM | POA: Diagnosis present

## 2019-03-11 LAB — BASIC METABOLIC PANEL
Anion gap: 10 (ref 5–15)
BUN: 12 mg/dL (ref 6–20)
CO2: 25 mmol/L (ref 22–32)
Calcium: 8.7 mg/dL — ABNORMAL LOW (ref 8.9–10.3)
Chloride: 103 mmol/L (ref 98–111)
Creatinine, Ser: 1.17 mg/dL (ref 0.61–1.24)
GFR calc Af Amer: >60 mL/min (ref >60)
GFR calc non Af Amer: >60 mL/min (ref >60)
Glucose, Bld: 97 mg/dL (ref 70–99)
Potassium: 3.6 mmol/L (ref 3.5–5.1)
Sodium: 138 mmol/L (ref 135–145)

## 2019-03-11 LAB — CBC
HCT: 29.9 % — ABNORMAL LOW (ref 39.0–52.0)
Hemoglobin: 8.8 g/dL — ABNORMAL LOW (ref 13.0–17.0)
MCH: 24.8 pg — ABNORMAL LOW (ref 26.0–34.0)
MCHC: 29.4 g/dL — ABNORMAL LOW (ref 30.0–36.0)
MCV: 84.2 fL (ref 80.0–100.0)
Platelets: 368 10*3/uL (ref 150–400)
RBC: 3.55 MIL/uL — ABNORMAL LOW (ref 4.22–5.81)
RDW: 17.6 % — ABNORMAL HIGH (ref 11.5–15.5)
WBC: 10.3 10*3/uL (ref 4.0–10.5)
nRBC: 0.2 % (ref 0.0–0.2)

## 2019-03-11 LAB — TROPONIN I
Troponin I: 0.06 ng/mL (ref <0.03)
Troponin I: 0.06 ng/mL (ref <0.03)

## 2019-03-11 LAB — GLUCOSE, CAPILLARY: Glucose-Capillary: 115 mg/dL — ABNORMAL HIGH (ref 70–99)

## 2019-03-11 LAB — BRAIN NATRIURETIC PEPTIDE: B Natriuretic Peptide: 19.9 pg/mL (ref 0.0–100.0)

## 2019-03-11 MED ORDER — DM-GUAIFENESIN ER 30-600 MG PO TB12: 1.0000 | ORAL_TABLET | Freq: Two times a day (BID) | ORAL | Status: DC | PRN

## 2019-03-11 MED ORDER — ONDANSETRON HCL 4 MG/2ML IJ SOLN: 4.0000 mg | Freq: Four times a day (QID) | INTRAMUSCULAR | Status: DC | PRN

## 2019-03-11 MED ORDER — RIVAROXABAN 20 MG PO TABS
20.0000 mg | ORAL_TABLET | Freq: Every day | ORAL | Status: DC
  Administered 2019-03-12: 20 mg via ORAL
  Filled 2019-03-11: qty 1

## 2019-03-11 MED ORDER — RIVAROXABAN 20 MG PO TABS: 20.0000 mg | ORAL_TABLET | Freq: Every day | ORAL | Status: DC

## 2019-03-11 MED ORDER — HYDRALAZINE HCL 20 MG/ML IJ SOLN: 5.0000 mg | INTRAMUSCULAR | Status: DC | PRN

## 2019-03-11 MED ORDER — SODIUM CHLORIDE 0.9% FLUSH: 3.0000 mL | INTRAVENOUS | Status: DC | PRN

## 2019-03-11 MED ORDER — ALBUTEROL SULFATE HFA 108 (90 BASE) MCG/ACT IN AERS
2.0000 | INHALATION_SPRAY | Freq: Once | RESPIRATORY_TRACT | Status: AC
  Administered 2019-03-11: 2 via RESPIRATORY_TRACT
  Filled 2019-03-11: qty 6.7

## 2019-03-11 MED ORDER — SPIRONOLACTONE 25 MG PO TABS
50.0000 mg | ORAL_TABLET | Freq: Every morning | ORAL | Status: DC
  Administered 2019-03-12: 50 mg via ORAL
  Filled 2019-03-11: qty 2

## 2019-03-11 MED ORDER — NITROGLYCERIN 0.4 MG SL SUBL: 0.4000 mg | SUBLINGUAL_TABLET | SUBLINGUAL | Status: DC | PRN

## 2019-03-11 MED ORDER — SODIUM CHLORIDE 0.9 % IV SOLN: 250.0000 mL | INTRAVENOUS | Status: DC | PRN

## 2019-03-11 MED ORDER — POLYETHYLENE GLYCOL 3350 17 G PO PACK: 17.0000 g | PACK | Freq: Every day | ORAL | Status: DC | PRN

## 2019-03-11 MED ORDER — ACETAMINOPHEN 325 MG PO TABS: 650.0000 mg | ORAL_TABLET | ORAL | Status: DC | PRN

## 2019-03-11 MED ORDER — ATORVASTATIN CALCIUM 10 MG PO TABS
20.0000 mg | ORAL_TABLET | Freq: Every day | ORAL | Status: DC
  Administered 2019-03-11: 20 mg via ORAL
  Filled 2019-03-11: qty 2

## 2019-03-11 MED ORDER — INSULIN ASPART 100 UNIT/ML ~~LOC~~ SOLN: 0.0000 [IU] | Freq: Every day | SUBCUTANEOUS | Status: DC

## 2019-03-11 MED ORDER — POLYVINYL ALCOHOL 1.4 % OP SOLN
1.0000 [drp] | Freq: Every day | OPHTHALMIC | Status: DC
  Administered 2019-03-11 – 2019-03-12 (×2): 1 [drp] via OPHTHALMIC
  Filled 2019-03-11: qty 15

## 2019-03-11 MED ORDER — IPRATROPIUM-ALBUTEROL 20-100 MCG/ACT IN AERS
1.0000 | INHALATION_SPRAY | Freq: Four times a day (QID) | RESPIRATORY_TRACT | Status: DC
  Administered 2019-03-12: 1 via RESPIRATORY_TRACT
  Filled 2019-03-11: qty 4

## 2019-03-11 MED ORDER — AMLODIPINE BESYLATE 10 MG PO TABS
10.0000 mg | ORAL_TABLET | Freq: Every morning | ORAL | Status: DC
  Administered 2019-03-12: 10 mg via ORAL
  Filled 2019-03-11: qty 1

## 2019-03-11 MED ORDER — LISINOPRIL 40 MG PO TABS
40.0000 mg | ORAL_TABLET | Freq: Every morning | ORAL | Status: DC
  Administered 2019-03-12: 40 mg via ORAL
  Filled 2019-03-11: qty 1

## 2019-03-11 MED ORDER — FERROUS SULFATE 325 (65 FE) MG PO TABS
325.0000 mg | ORAL_TABLET | Freq: Every day | ORAL | Status: DC
  Administered 2019-03-12: 325 mg via ORAL
  Filled 2019-03-11: qty 1

## 2019-03-11 MED ORDER — METHOCARBAMOL 500 MG PO TABS: 500.0000 mg | ORAL_TABLET | Freq: Three times a day (TID) | ORAL | Status: DC | PRN

## 2019-03-11 MED ORDER — DOCUSATE SODIUM 100 MG PO CAPS
100.0000 mg | ORAL_CAPSULE | Freq: Two times a day (BID) | ORAL | Status: DC
  Administered 2019-03-11 – 2019-03-12 (×2): 100 mg via ORAL
  Filled 2019-03-11 (×2): qty 1

## 2019-03-11 MED ORDER — SODIUM CHLORIDE 0.9% FLUSH
3.0000 mL | Freq: Once | INTRAVENOUS | Status: AC
  Administered 2019-03-11: 3 mL via INTRAVENOUS

## 2019-03-11 MED ORDER — LABETALOL HCL 200 MG PO TABS
200.0000 mg | ORAL_TABLET | Freq: Every morning | ORAL | Status: DC
  Administered 2019-03-12: 200 mg via ORAL
  Filled 2019-03-11: qty 1

## 2019-03-11 MED ORDER — CARBOXYMETHYLCELLULOSE SODIUM 0.5 % OP SOLN: 1.0000 [drp] | Freq: Every day | OPHTHALMIC | Status: DC

## 2019-03-11 MED ORDER — FUROSEMIDE 80 MG PO TABS
80.0000 mg | ORAL_TABLET | Freq: Three times a day (TID) | ORAL | Status: DC
  Administered 2019-03-11 – 2019-03-12 (×2): 80 mg via ORAL
  Filled 2019-03-11 (×2): qty 1

## 2019-03-11 MED ORDER — INSULIN ASPART 100 UNIT/ML ~~LOC~~ SOLN
0.0000 [IU] | Freq: Three times a day (TID) | SUBCUTANEOUS | Status: DC
  Administered 2019-03-12: 2 [IU] via SUBCUTANEOUS

## 2019-03-11 MED ORDER — ALBUTEROL SULFATE HFA 108 (90 BASE) MCG/ACT IN AERS
2.0000 | INHALATION_SPRAY | RESPIRATORY_TRACT | Status: DC | PRN
  Filled 2019-03-11: qty 6.7

## 2019-03-11 MED ORDER — MORPHINE SULFATE (PF) 2 MG/ML IV SOLN: 2.0000 mg | INTRAVENOUS | Status: DC | PRN

## 2019-03-11 MED ORDER — PANTOPRAZOLE SODIUM 40 MG PO TBEC
40.0000 mg | DELAYED_RELEASE_TABLET | Freq: Every day | ORAL | Status: DC
  Administered 2019-03-12: 40 mg via ORAL
  Filled 2019-03-11: qty 1

## 2019-03-11 MED ORDER — SODIUM CHLORIDE 0.9% FLUSH
3.0000 mL | Freq: Two times a day (BID) | INTRAVENOUS | Status: DC
  Administered 2019-03-12: 3 mL via INTRAVENOUS

## 2019-03-11 NOTE — Progress Notes (Signed)
Virtual Visit via Telephone Note Due to national recommendations of social distancing due to [place] 19, telehealth visit is felt to be most appropriate for this patient at this time.  I discussed the limitations, risks, security and privacy concerns of performing an evaluation and management service by telephone and the availability of in person appointments. I also discussed with the patient that there may be a patient responsible charge related to this service. The patient expressed understanding and agreed to proceed.    I connected with Benjamin [name] on 03/11/19  at  10:50 AM EDT  EDT by telephone and verified that I am speaking with the correct person using two identifiers.   Consent I discussed the limitations, risks, security and privacy concerns of performing an evaluation and management service by telephone and the availability of in person appointments. I also discussed with the patient that there may be a patient responsible charge related to this service. The patient expressed understanding and agreed to proceed.   Location of Patient: Private Residence   Location of Provider: [place] and [employer] Office    Persons participating in Telemedicine visit: [name] [place]    History of Present Illness: Telemedicine visit for: Follow up  has a past medical history of A-fib [facility]), Diabetes mellitus without complication ([place]), Elevated troponin (02/06/2019), Hypertension, Obesity, Pulmonary embolism ([place]), and Sleep apnea.   He is significantly short of breath over the phone today. Unable to speak in complete sentences without having to stop to take a breath.  Has pulse oximeter with readings today in the 80s on RA. He is currently not on any O2. Unfortunately it appears he was not discharged with any oxygen after his last hospital admission. He endorses chest pressure, increased bilateral extremity edema (he in only taking lasix 80  mg BID, unable to sleep at night due to polyuria taking Lasix 80mg  TID) and significant DOE. Unclear why he is on such a high dose of lasix.  At this time I have instructed him to go to the hospital to evaluate his cardio pulmonary status at this time.    Past Medical History:  Diagnosis Date  . A-fib ([place])   . Diabetes mellitus without complication ([place])   . Elevated troponin 02/06/2019  . Hypertension   . Obesity   . Pulmonary embolism ([place])   . Sleep apnea     Past Surgical History:  Procedure Laterality Date  . COLONOSCOPY WITH PROPOFOL Left 02/25/2019   Procedure: COLONOSCOPY WITH PROPOFOL;  Surgeon: Arta Silence, MD;  Location: [facility];  Service: Endoscopy;  Laterality: Left;  . ESOPHAGOGASTRODUODENOSCOPY (EGD) WITH PROPOFOL Left 02/25/2019   Procedure: ESOPHAGOGASTRODUODENOSCOPY (EGD) WITH PROPOFOL;  Surgeon: Arta Silence, MD;  Location: [facility] ENDOSCOPY;  Service: Endoscopy;  Laterality: Left;  . NO PAST SURGERIES      Family History  Problem Relation Age of Onset  . Hypertension Mother   . Diabetes Mother     Social History   Socioeconomic History  . Marital status: Married    Spouse name: Not on file  . Number of children: Not on file  . Years of education: Not on file  . Highest education level: Not on file  Occupational History  . Not on file  Social Needs  . Financial resource strain: Not on file  . Food insecurity    Worry: Not on file    Inability: Not on file  . Transportation needs    Medical: Not  on file    Non-medical: Not on file  Tobacco Use  . Smoking status: Former Games developer  . Smokeless tobacco: Never Used  Substance and Sexual Activity  . Alcohol use: Not Currently  . Drug use: Not Currently  . Sexual activity: Not Currently  Lifestyle  . Physical activity    Days per week: Not on file    Minutes per session: Not on file  . Stress: Not on file  Relationships  . Social Musician on phone: Not on file    Gets together: Not  on file    Attends religious service: Not on file    Active member of club or organization: Not on file    Attends meetings of clubs or organizations: Not on file    Relationship status: Not on file  Other Topics Concern  . Not on file  Social History Narrative  . Not on file     Observations/Objective: Awake, alert and oriented x 3   Review of Systems  Constitutional: Negative for fever, malaise/fatigue and weight loss.  HENT: Negative.  Negative for nosebleeds.   Eyes: Negative.  Negative for blurred vision, double vision and photophobia.  Respiratory: Positive for shortness of breath. Negative for cough.   Cardiovascular: Positive for chest pain and leg swelling. Negative for palpitations.  Gastrointestinal: Negative.  Negative for heartburn, nausea and vomiting.  Musculoskeletal: Negative.  Negative for myalgias.  Neurological: Negative.  Negative for dizziness, focal weakness, seizures and headaches.  Psychiatric/Behavioral: Negative.  Negative for suicidal ideas.    Assessment and Plan: Diagnoses and all orders for this visit:  Shortness of breath Sent to the hospital   Follow Up Instructions Return follow up after hospital visit.     I discussed the assessment and treatment plan with the patient. The patient was provided an opportunity to ask questions and all were answered. The patient agreed with the plan and demonstrated an understanding of the instructions.   The patient was advised to call back or seek an in-person evaluation if the symptoms worsen or if the condition fails to improve as anticipated.  I provided  minutes of non-face-to-face time during this encounter including median intraservice time, reviewing previous notes, labs, imaging, medications and explaining diagnosis and management.  Claiborne Rigg, FNP-BC

## 2019-03-11 NOTE — ED Notes (Signed)
ED TO INPATIENT HANDOFF REPORT  ED Nurse Name and Phone #: Evette Georges Name/Age/Gender Benjamin [name] 53 y.o. male Room/Bed: 020C/020C  Code Status   Code Status: Prior  Home/SNF/Other Home Patient oriented to: self, place, time and situation Is this baseline? Yes   Triage Complete: Triage complete  Chief Complaint SOB  Triage Note Pt reports he was recently admitted to the hospital for SOB, states he was supposed to get home O2 but the order wasn't put in so he hasnt been using it. Current resting RA O2 is 99% but pt states it goes into the low 80's when he ambulates. Visibly SOB in triage with movement   Allergies No Known Allergies  Level of Care/Admitting Diagnosis ED Disposition    ED Disposition Condition [facility] Area: [place] [[id number]]  Level of Care: Telemetry Cardiac [103]  I expect the patient will be discharged within 24 hours: No (not a candidate for 5C-Observation unit)  Covid Evaluation: Screening Protocol (No Symptoms)  Diagnosis: Dyspnea [030131]  Admitting Physician: Ivor Costa [[id number]]  Attending Physician: Ivor Costa [[id number]]  PT Class (Do Not Modify): Observation [104]  PT Acc Code (Do Not Modify): Observation [10022]       B Medical/Surgery History Past Medical History:  Diagnosis Date  . A-fib ([place])   . Diabetes mellitus without complication ([place])   . Elevated troponin 02/06/2019  . Hypertension   . Obesity   . Pulmonary embolism ([place])   . Sleep apnea    Past Surgical History:  Procedure Laterality Date  . COLONOSCOPY WITH PROPOFOL Left 02/25/2019   Procedure: COLONOSCOPY WITH PROPOFOL;  Surgeon: Arta Silence, MD;  Location: [facility];  Service: Endoscopy;  Laterality: Left;  . ESOPHAGOGASTRODUODENOSCOPY (EGD) WITH PROPOFOL Left 02/25/2019   Procedure: ESOPHAGOGASTRODUODENOSCOPY (EGD) WITH PROPOFOL;  Surgeon: Arta Silence, MD;  Location: [facility] ENDOSCOPY;  Service: Endoscopy;  Laterality: Left;  .  NO PAST SURGERIES       A IV Location/Drains/Wounds Patient Lines/Drains/Airways Status   Active Line/Drains/Airways    Name:   Placement date:   Placement time:   Site:   Days:   Peripheral IV 03/11/19 Right Forearm   03/11/19    1845    Forearm   less than 1          Intake/Output Last 24 hours No intake or output data in the 24 hours ending 03/11/19 2034  Labs/Imaging Results for orders placed or performed during the hospital encounter of 03/11/19 (from the past 48 hour(s))  Basic metabolic panel     Status: Abnormal   Collection Time: 03/11/19  4:44 PM  Result Value Ref Range   Sodium 138 135 - 145 mmol/L   Potassium 3.6 3.5 - 5.1 mmol/L   Chloride 103 98 - 111 mmol/L   CO2 25 22 - 32 mmol/L   Glucose, Bld 97 70 - 99 mg/dL   BUN 12 6 - 20 mg/dL   Creatinine, Ser 1.17 0.61 - 1.24 mg/dL   Calcium 8.7 (L) 8.9 - 10.3 mg/dL   GFR calc non Af Amer >60 >60 mL/min   GFR calc Af Amer >60 >60 mL/min   Anion gap 10 5 - 15    Comment: Performed at [facility], [place] [address] [place], [place] [address]  CBC     Status: Abnormal   Collection Time: 03/11/19  4:44 PM  Result Value Ref Range   WBC 10.3 4.0 - 10.5  K/uL   RBC 3.55 (L) 4.22 - 5.81 MIL/uL   Hemoglobin 8.8 (L) 13.0 - 17.0 g/dL   HCT 40.929.9 (L) 81.139.0 - 91.452.0 %   MCV 84.2 80.0 - 100.0 fL   MCH 24.8 (L) 26.0 - 34.0 pg   MCHC 29.4 (L) 30.0 - 36.0 g/dL   RDW 78.217.6 (H) 95.611.5 - 21.315.5 %   Platelets 368 150 - 400 K/uL   nRBC 0.2 0.0 - 0.2 %    Comment: Performed at [facility], [address] [place], [place] [address]  Troponin I - ONCE - STAT     Status: Abnormal   Collection Time: 03/11/19  4:44 PM  Result Value Ref Range   Troponin I 0.06 (HH) <0.03 ng/mL    Comment: CRITICAL RESULT CALLED TO, READ BACK BY AND VERIFIED WITHFrankey Poot: S STRAUHGN RN AT 1808 ON [id number] BY Marveen ReeksK FORSYTH Performed at [facility], [address] [place], [place] [address]   Brain natriuretic peptide     Status: None   Collection  Time: 03/11/19  4:54 PM  Result Value Ref Range   B Natriuretic Peptide 19.9 0.0 - 100.0 pg/mL    Comment: Performed at [facility], [address] [place], [place] [address]   Dg Chest 2 View  Result Date: 03/11/2019 CLINICAL DATA:  Shortness of breath. EXAM: CHEST - 2 VIEW COMPARISON:  CT chest and chest x-ray dated February 26, 2019. FINDINGS: Unchanged borderline cardiomegaly. Normal mediastinal contours. Normal pulmonary vascularity. Unchanged minimal bibasilar atelectasis. No focal consolidation, pleural effusion, or pneumothorax. No acute osseous abnormality. IMPRESSION: No active cardiopulmonary disease. Electronically Signed   By: Obie DredgeWilliam T Derry M.D.   On: 03/11/2019 17:04    Pending Labs Unresulted Labs (From admission, onward)    Start     Ordered   03/11/19 2003  Troponin I - Now Then Q6H  Now then every 6 hours,   R (with STAT occurrences)     03/11/19 2002   03/11/19 1955  Occult blood card to lab, stool RN will collect  Once,   STAT    Question:  Specimen to be collected by?  Answer:  RN will collect   03/11/19 1954   03/11/19 1948  Novel Coronavirus,NAA,(SEND-OUT TO REF LAB - TAT 24-48 hrs); Hosp Order  (Asymptomatic Patients Labs)  Once,   STAT    Question:  Rule Out  Answer:  Yes   03/11/19 1947   Signed and Held  Basic metabolic panel  Daily,   R     Signed and Held   Signed and Held  Protime-INR  Once,   R     Signed and Held   Signed and Held  APTT  Once,   R     Signed and Held          Vitals/Pain Today's Vitals   03/11/19 1638 03/11/19 1639 03/11/19 1949  BP: (!) 152/97  (!) 145/85  Pulse: 97  88  Resp: (!) 22  18  Temp: 98.6 F (37 C)  98.7 F (37.1 C)  TempSrc:   Oral  SpO2: 99%  96%  PainSc:  0-No pain 0-No pain    Isolation Precautions No active isolations  Medications Medications  albuterol (VENTOLIN HFA) 108 (90 Base) MCG/ACT inhaler 2 puff (has no administration in time range)  sodium chloride flush (NS) 0.9 % injection 3 mL (3  mLs Intravenous Given 03/11/19 1846)  albuterol (VENTOLIN HFA) 108 (90 Base) MCG/ACT  inhaler 2 puff (2 puffs Inhalation Given 03/11/19 1951)    Mobility walks Low fall risk   Focused Assessments none further   R Recommendations: See Admitting Provider Note  Report given to:   Additional Notes:

## 2019-03-11 NOTE — Care Management (Signed)
Patient presented to [facility] ED with c/o SOB and CP patient was recently discharged from [facility] and should have been on home oxygen, but no recent order for home oxygen noted, last order was from 02/12/19. CM noted an oxygen qualifying note.  [place] CM Opal Sidles was in touch with patient today on a  tele-health call,and she advised him to return to the ED.   ED CM met with patient at bedside, and patient reports that he spoke with someone who told she would get his oxygen but stated she never returned.  CM updated Dr. Dayna Barker, patient is still being evaluated for CP and SOB so he will more than likely be brought in for observation until more medically stable. CM will continue to follow for TOC and assistance with home oxygen set up.

## 2019-03-11 NOTE — ED Notes (Addendum)
While ambulating pt, pt's O2 started off at 94%. As pt continued to ambulate, pt's O2 dropped to 92%. Pt stated that he was having tightness in his chest. Pt sat on bed and O2 was at 91%. Pt's pulse was 109 -112. Informed [place] - RN & Dr. Dayna Barker.

## 2019-03-11 NOTE — H&P (Addendum)
History and Physical    Benjamin [name]:[id number] DOB: 08-29-1966 DOA: 03/11/2019  Referring MD/NP/PA:   PCP: [name], NP   Patient coming from:  The patient is coming from home.  At baseline, pt is independent for most of ADL.        Chief Complaint: SOB  HPI: Benjamin [name] is a 53 y.o. male with medical history significant of dCHF, hypertension, hyperlipidemia, diabetes mellitus, GERD, atrial fibrillation and PE on Xarelto, OSA on CPAP, iron deficiency anemia, morbid obesity, chronic elevation of troponin (recent troponin baseline 0.10-0.18), who presents with shortness of breath.  Patient was recently hospitalized from 6/3-6/12 due GIB. S/p EGD and colonoscopy by GI notable for external/internal hemorrhoids. His  coumadin is on hold again. Pt had hx of recent PE in 01/2019 which was unprovoked. In previous admission, patient had a nonconclusive CT angiogram. Then he had negative VQ scan and LE venous doppler for DVT. Pt states states he was supposed to get home O, but the order wasn't put in so he hasnt been using it. He states that he has SOB and had oxygen desaturation to 80s on ambulating at home.  Currently his oxygen saturation is 99% at rest on room air, and 92% on ambulating in ED.  He has dry cough and with some chest tightness.  No fever or chills.  Patient states that he has not noticed dark stool or rectal bleeding for more than a week.  No nausea vomiting, diarrhea or abdominal pain.  No symptoms of UTI or unilateral weakness.   ED Course: pt was found to have hemoglobin 8.8 (8.5 on 03/01/2019), troponin 0 0.06, BNP 19.9, pending COVID-19, pending FOBT, creatinine 1.17, BUN 12, GFR>60, temperature normal, blood pressure 152/97, heart rate 97, RR 22, negative chest x-ray.  Patient is placed on telemetry bed for observation.  Review of Systems:   General: no fevers, chills, no body weight gain, has fatigue HEENT: no blurry vision, hearing changes or sore throat  Respiratory: has dyspnea, coughing, no wheezing CV: no chest pain, no palpitations GI: no nausea, vomiting, abdominal pain, diarrhea, constipation GU: no dysuria, burning on urination, increased urinary frequency, hematuria  Ext: has mild leg edema Neuro: no unilateral weakness, numbness, or tingling, no vision change or hearing loss Skin: no rash, no skin tear. MSK: No muscle spasm, no deformity, no limitation of range of movement in spin Heme: No easy bruising.  Travel history: No recent long distant travel.  Allergy: No Known Allergies  Past Medical History:  Diagnosis Date  . A-fib ([place])   . Diabetes mellitus without complication ([place])   . Elevated troponin 02/06/2019  . Hypertension   . Obesity   . Pulmonary embolism ([place])   . Sleep apnea     Past Surgical History:  Procedure Laterality Date  . COLONOSCOPY WITH PROPOFOL Left 02/25/2019   Procedure: COLONOSCOPY WITH PROPOFOL;  Surgeon: Arta Silence, MD;  Location: [place];  Service: Endoscopy;  Laterality: Left;  . ESOPHAGOGASTRODUODENOSCOPY (EGD) WITH PROPOFOL Left 02/25/2019   Procedure: ESOPHAGOGASTRODUODENOSCOPY (EGD) WITH PROPOFOL;  Surgeon: Arta Silence, MD;  Location: [facility] ENDOSCOPY;  Service: Endoscopy;  Laterality: Left;  . NO PAST SURGERIES      Social History:  reports that he has quit smoking. He has never used smokeless tobacco. He reports previous alcohol use. He reports previous drug use.  Family History:  Family History  Problem Relation Age of Onset  . Hypertension Mother   . Diabetes Mother  Prior to Admission medications   Medication Sig Start Date End Date Taking? Authorizing Provider  amLODipine (NORVASC) 10 MG tablet Take 10 mg by mouth daily. 01/03/19   [provider]  atorvastatin (LIPITOR) 40 MG tablet Take 40 mg by mouth at bedtime.  01/03/19   [provider]  carboxymethylcellulose (REFRESH PLUS) 0.5 % SOLN Place 1 drop into both eyes daily as needed (dry eyes).     [provider]  docusate sodium (COLACE) 100 MG capsule Take 1 capsule (100 mg total) by mouth 2 (two) times daily. 03/01/19   Susa GriffinsVasireddy, Padmaja, MD  ferrous sulfate 325 (65 FE) MG tablet Take 1 tablet (325 mg total) by mouth daily for 30 days. 02/25/19 03/27/19  Zigmund DanielPowell, A Caldwell Jr., MD  furosemide (LASIX) 80 MG tablet Take 80 mg by mouth 3 (three) times daily. 01/03/19   [provider]  labetalol (NORMODYNE) 200 MG tablet Take 200 mg by mouth daily. 01/03/19   [provider]  lisinopril (ZESTRIL) 40 MG tablet Take 40 mg by mouth daily. 01/03/19   [provider]  metFORMIN (GLUCOPHAGE) 500 MG tablet Take 500 mg by mouth 2 (two) times a day. 01/03/19   [provider]  methocarbamol (ROBAXIN) 500 MG tablet Take 1 tablet (500 mg total) by mouth every 8 (eight) hours as needed for muscle spasms. 02/14/19   Leroy SeaSingh, Prashant K, MD  omeprazole (PRILOSEC) 40 MG capsule Take 40 mg by mouth daily before breakfast. 01/03/19   [provider]  polyethylene glycol (MIRALAX / GLYCOLAX) 17 g packet Take 17 g by mouth daily as needed for severe constipation. 03/01/19   Susa GriffinsVasireddy, Padmaja, MD  spironolactone (ALDACTONE) 50 MG tablet Take 50 mg by mouth daily. 01/03/19   [provider]  Throat Lozenges (COUGH DROPS MT) 1 drop as needed (for throat dryness).    [provider]  triamcinolone cream (KENALOG) 0.1 % Apply 1 application topically 3 (three) times daily. 02/01/19   [provider]    Physical Exam: Vitals:   03/11/19 2030 03/11/19 2100 03/11/19 2114 03/12/19 0046  BP: (!) 175/84  (!) 158/106 (!) 148/88  Pulse: 85  87 93  Resp: 20  19 18   Temp:   98.4 F (36.9 C) 98.4 F (36.9 C)  TempSrc:   Oral Oral  SpO2: 94%  95% 96%  Weight:  (!) 188.8 kg    Height:  5\' 11"  (1.803 m)     General: Not in acute distress HEENT:       Eyes: PERRL, EOMI, no scleral icterus.       ENT: No discharge from the ears and nose, no pharynx  injection, no tonsillar enlargement.        Neck: No JVD, no bruit, no mass felt. Heme: No neck lymph node enlargement. Cardiac: S1/S2, RRR, No murmurs, No gallops or rubs. Respiratory: No rales, wheezing, rhonchi or rubs. GI: Soft, nondistended, nontender, no rebound pain, no organomegaly, BS present. GU: No hematuria Ext: has trace leg edema bilaterally. 2+DP/PT pulse bilaterally. Musculoskeletal: No joint deformities, No joint redness or warmth, no limitation of ROM in spin. Skin: No rashes.  Neuro: Alert, oriented X3, cranial nerves II-XII grossly intact, moves all extremities normally. Psych: Patient is not psychotic, no suicidal or hemocidal ideation.  Labs on Admission: I have personally reviewed following labs and imaging studies  CBC: Recent Labs  Lab 03/11/19 1644  WBC 10.3  HGB 8.8*  HCT 29.9*  MCV 84.2  PLT 368  Basic Metabolic Panel: Recent Labs  Lab 03/11/19 1644  NA 138  K 3.6  CL 103  CO2 25  GLUCOSE 97  BUN 12  CREATININE 1.17  CALCIUM 8.7*   GFR: Estimated Creatinine Clearance: 124.7 mL/min (by C-G formula based on SCr of 1.17 mg/dL). Liver Function Tests: No results for input(s): AST, ALT, ALKPHOS, BILITOT, PROT, ALBUMIN in the last 168 hours. No results for input(s): LIPASE, AMYLASE in the last 168 hours. No results for input(s): AMMONIA in the last 168 hours. Coagulation Profile: No results for input(s): INR, PROTIME in the last 168 hours. Cardiac Enzymes: Recent Labs  Lab 03/11/19 1644 03/11/19 2003  TROPONINI 0.06* 0.06*   BNP (last 3 results) No results for input(s): PROBNP in the last 8760 hours. HbA1C: No results for input(s): HGBA1C in the last 72 hours. CBG: Recent Labs  Lab 03/11/19 2123  GLUCAP 115*   Lipid Profile: No results for input(s): CHOL, HDL, LDLCALC, TRIG, CHOLHDL, LDLDIRECT in the last 72 hours. Thyroid Function Tests: No results for input(s): TSH, T4TOTAL, FREET4, T3FREE, THYROIDAB in the last 72 hours.  Anemia Panel: No results for input(s): VITAMINB12, FOLATE, FERRITIN, TIBC, IRON, RETICCTPCT in the last 72 hours. Urine analysis:    Component Value Date/Time   COLORURINE YELLOW 02/07/2019 1042   APPEARANCEUR HAZY (A) 02/07/2019 1042   LABSPEC 1.043 (H) 02/07/2019 1042   PHURINE 5.0 02/07/2019 1042   GLUCOSEU NEGATIVE 02/07/2019 1042   HGBUR NEGATIVE 02/07/2019 1042   BILIRUBINUR NEGATIVE 02/07/2019 1042   KETONESUR NEGATIVE 02/07/2019 1042   PROTEINUR 30 (A) 02/07/2019 1042   NITRITE NEGATIVE 02/07/2019 1042   LEUKOCYTESUR NEGATIVE 02/07/2019 1042   Sepsis Labs: @LABRCNTIP (procalcitonin:4,lacticidven:4) ) Recent Results (from the past 240 hour(s))  SARS Coronavirus 2 (CEPHEID - Performed in [facility] hospital lab), Hosp Order     Status: Abnormal   Collection Time: 03/11/19 11:18 PM   Specimen: Nasopharyngeal Swab  Result Value Ref Range Status   SARS Coronavirus 2 POSITIVE (A) NEGATIVE Final    Comment: RESULT CALLED TO, READ BACK BY AND VERIFIED WITH: L NITURADA RN 03/12/19 0019 JDW (NOTE) If result is NEGATIVE SARS-CoV-2 target nucleic acids are NOT DETECTED. The SARS-CoV-2 RNA is generally detectable in upper and lower  respiratory specimens during the acute phase of infection. The lowest  concentration of SARS-CoV-2 viral copies this assay can detect is 250  copies / mL. A negative result does not preclude SARS-CoV-2 infection  and should not be used as the sole basis for treatment or other  patient management decisions.  A negative result may occur with  improper specimen collection / handling, submission of specimen other  than nasopharyngeal swab, presence of viral mutation(s) within the  areas targeted by this assay, and inadequate number of viral copies  (<250 copies / mL). A negative result must be combined with clinical  observations, patient history, and epidemiological information. If result is POSITIVE SARS-CoV-2 target nucleic acids are DETECTED. The SA  RS-CoV-2 RNA is generally detectable in upper and lower  respiratory specimens during the acute phase of infection.  Positive  results are indicative of active infection with SARS-CoV-2.  Clinical  correlation with patient history and other diagnostic information is  necessary to determine patient infection status.  Positive results do  not rule out bacterial infection or co-infection with other viruses. If result is PRESUMPTIVE POSTIVE SARS-CoV-2 nucleic acids MAY BE PRESENT.   A presumptive positive result was obtained on the submitted specimen  and confirmed on  repeat testing.  While 2019 novel coronavirus  (SARS-CoV-2) nucleic acids may be present in the submitted sample  additional confirmatory testing may be necessary for epidemiological  and / or clinical management purposes  to differentiate between  SARS-CoV-2 and other Sarbecovirus currently known to infect humans.  If clinically indicated additional testing with an alternate test  methodology [phone]([id number]) is advi sed. The SARS-CoV-2 RNA is generally  detectable in upper and lower respiratory specimens during the acute  phase of infection. The expected result is Negative. Fact Sheet for Patients:  BoilerBrush.com.cyhttps://www.fda.gov/media/136312/download Fact Sheet for Healthcare Providers: https://pope.com/https://www.fda.gov/media/136313/download This test is not yet approved or cleared by the [place] FDA and has been authorized for detection and/or diagnosis of SARS-CoV-2 by FDA under an Emergency Use Authorization (EUA).  This EUA will remain in effect (meaning this test can be used) for the duration of the COVID-19 declaration under Section 564(b)(1) of the Act, 21 U.S.C. section 360bbb-3(b)(1), unless the authorization is terminated or revoked sooner. Performed at [facility], [address] [place], [place] [address]      Radiological Exams on Admission: Dg Chest 2 View  Result Date: 03/11/2019 CLINICAL DATA:  Shortness of breath.  EXAM: CHEST - 2 VIEW COMPARISON:  CT chest and chest x-ray dated February 26, 2019. FINDINGS: Unchanged borderline cardiomegaly. Normal mediastinal contours. Normal pulmonary vascularity. Unchanged minimal bibasilar atelectasis. No focal consolidation, pleural effusion, or pneumothorax. No acute osseous abnormality. IMPRESSION: No active cardiopulmonary disease. Electronically Signed   By: Obie DredgeWilliam T Derry M.D.   On: 03/11/2019 17:04     EKG: Independently reviewed.  Sinus rhythm, QTC 380, LAE, T wave inversion in inferior leads and V4-V6, early R wave progression   Assessment/Plan Principal Problem:   Dyspnea Active Problems:   PE (pulmonary thromboembolism) (HCC)   Diabetes mellitus type 2 in obese (HCC)   Chronic diastolic CHF (congestive heart failure) (HCC)   PAF (paroxysmal atrial fibrillation) (HCC)   COVID-19 virus infection   Iron deficiency anemia   OSA on CPAP   HLD (hyperlipidemia)   Elevated troponin   GERD (gastroesophageal reflux disease)   Dyspnea: Chest x-ray negative.  Hemoglobin is at baseline.  Troponin 0.06, which is also at the baseline.  His shortness of breath is likely multifactorial etiology, including OSA, dCHF and hx of PE. In previous admission, patient had a nonconclusive CT angiogram. Then he had negative VQ scan and LE venous doppler for DVT. I has low suspicion for acute PE today. Pt most likely needs home oxygen set up.  -place on tele bed for obs -CPAP for OSA -Combivent inhaler, PRN albuterol inhaler, PRN Mucinex for cough -Nasal cannula oxygen to maintain oxygen saturation above 93% on room air -consult CM for home O2 need  Addendum: pt's Covid19 comes back positive. CXR has no infiltration. Oxygen saturation 92 to 99% on RA -will transfer pt to [facility] -D-dimer, BNP,Trop, LFT, CRP, LDH, Procalcitonin, IL-6, ferritin, fibinogen, TG, Hep B SAg, HIV ab -Daily CRP, Ferritin, D-dimer,  PE (pulmonary thromboembolism) (HCC): pt told pharmacist that he has been  taking Xarelto in the past 2 or 3 days, but does not remember the dosage. -Xarelto per pharm -f/u FOBT  Diabetes mellitus type 2 in obese [facility](HCC): Last A1c 7.6 on 01/23/19, not well controled. Patient is taking metformin at home -SSI  Chronic diastolic CHF (congestive heart failure) (HCC): BNP 19.9. No pulmonary edema chest x-ray.  CHF seem to be compensated. -Continue home Lasix and spironolactone  PAF (paroxysmal atrial  fibrillation) [facility](HCC): CHA2DS2-VASc Score is 3, needs oral anticoagulation. Patient is on Xarelto at home. Heart rate is well controlled. -Continue home labetalol and Xarelto  Iron deficiency anemia: hgb stable, 8.5 on 03/01/19-->8.8 today -f/u FOBT  OSA -on CPAP  HLD (hyperlipidemia): -lipitor  Elevated troponin: pt has chronic elevated troponin 0.1-0.18. today his trop is 0.06.  He has had some chest tightness.  Currently no chest pain.  Patient has some mild T wave inversion in inferior leads and V4-V6, which seems to be new. -Follow-up troponin x3 -Continue labetalol and Lipitor -prn nitroglycerin and morphine  GERD (gastroesophageal reflux disease): -protonix     DVT ppx: Xarelto Code Status: Full code Family Communication: None at bed side.        Disposition Plan:  Anticipate discharge back to previous home environment Consults called:  none Admission status: Obs / tele  on       Date of Service 03/12/2019    Lorretta HarpXilin Nakea Gouger [facility]   If 7PM-7AM, please contact night-coverage [email or web address] Password [email or web address] 03/12/2019, 3:37 AM

## 2019-03-11 NOTE — Telephone Encounter (Signed)
Call placed to patient to check on him as he has not yet gone to ED as instructed by Geryl Rankins, NP.  He said that he is not going tonight because he can't get there now. He stated that he needs to go with the person who will be at the home tomorrow morning. He said that he is now feeling " all right" because he is using someone else's O2. He was short of breath while speaking on the phone. Explained to him that is is not appropriate to use someone else's O2. He should go to ED to be assessed.  If he does not have a ride he should call 911. If he becomes more short of breath or develops chest pain , he should call 911. He said that he understood but he will wait until the morning  He said that he has the phone number for this clinic if he has questions.  This CM again urged him to go to ED or call 911 and again he said he understood.   Call placed to Magdalen Spatz, RN CM explained to her that as per physician discharge summary, the patient was to be have home O2 but that was never set up and he is now using someone else's O2.  This CM also explained patient's current status.  Nira Conn said she would look into it and call this CM back.   Call received from [facility], she explained that and order for home O2 was never received.  she called patient and explained to him that he needs to go to the ED.  She has alerted the ED CM of the patient's status.   Update provided to Z. Raul Del, NP

## 2019-03-11 NOTE — Care Management (Signed)
Received a call from Opal Sidles at [facility]. Patient was discharged 03/01/19 in discharge summary states patient needs home oxygen , also home oxygen qualifying note dated 02/25/19 patient desaturation. Patient has no insurance living in halfway house, told Opal Sidles he is using someone else's oxygen. Opal Sidles advised patient to come to the ED , however, he states he cannot until tomorrow morning.  Reviewed chart. Discharging doctor is not on amion today. Saturation note needs to be within 24 to 48 hours of discharge. No order for home oxygen was entered. NCM called patient at [phone]  confirmed I was speaking to [name], patient reported he is using someone else's oxygen. Advised patient to come to ED today for shortness breath. Patient stated he will talk to Director of halfway house and come to ED. ED NCM aware.   Called Opal Sidles [phone] at [employer] back and left message.  Case Management Leader Olga Coaster aware.    Magdalen Spatz RN BSN [phone]

## 2019-03-11 NOTE — Progress Notes (Signed)
ANTICOAGULATION CONSULT NOTE - Initial Consult  Pharmacy Consult for rivaroxaban Indication: pulmonary embolus   No Known Allergies   Vital Signs: Temp: 98.7 F (37.1 C) (06/22 1949) Temp Source: Oral (06/22 1949) BP: 145/85 (06/22 1949) Pulse Rate: 88 (06/22 1949)  Labs: Recent Labs    03/11/19 1644  HGB 8.8*  HCT 29.9*  PLT 368  CREATININE 1.17  TROPONINI 0.06*    CrCl cannot be calculated (Unknown ideal weight.).   Medical History: Past Medical History:  Diagnosis Date  . A-fib ([place])   . Diabetes mellitus without complication ([place])   . Elevated troponin 02/06/2019  . Hypertension   . Obesity   . Pulmonary embolism ([place])   . Sleep apnea     Assessment: Benjamin [name] is a 53yo male admitted with SOB with exertion. Has hx of atrial fibrillation previously on warfarin. He notes he recently started taking Xarelto a few days ago does not know dosage or dosing regimen. He does endorse last dose 6/22 AM. Recent hospitalization from 6/3 - 6/12 due to GIB. Hx PE 01/2019. Pharmacy consulted to start Xarelto for hx PE and atrial fibrillation.   Goal of Therapy:  Monitor platelets by anticoagulation protocol: Yes   Plan:  Xarelto 20mg  daily with food - starting 6/23 AM Monitor renal function and s/sx of bleeding   Thank you for involving pharmacy in this patient's care.  Janae Bridgeman, PharmD PGY1 Pharmacy Resident Phone: [phone] 03/11/2019 8:43 PM

## 2019-03-11 NOTE — ED Triage Notes (Signed)
Pt reports he was recently admitted to the hospital for SOB, states he was supposed to get home O2 but the order wasn't put in so he hasnt been using it. Current resting RA O2 is 99% but pt states it goes into the low 80's when he ambulates. Visibly SOB in triage with movement

## 2019-03-12 ENCOUNTER — Telehealth: Payer: Self-pay

## 2019-03-12 DIAGNOSIS — U071 COVID-19: Secondary | ICD-10-CM | POA: Diagnosis not present

## 2019-03-12 LAB — HEPATIC FUNCTION PANEL
ALT: 19 U/L (ref 0–44)
AST: 18 U/L (ref 15–41)
Albumin: 3.4 g/dL — ABNORMAL LOW (ref 3.5–5.0)
Alkaline Phosphatase: 48 U/L (ref 38–126)
Bilirubin, Direct: <0.1 mg/dL (ref 0.0–0.2)
Total Bilirubin: 0.2 mg/dL — ABNORMAL LOW (ref 0.3–1.2)
Total Protein: 7.3 g/dL (ref 6.5–8.1)

## 2019-03-12 LAB — TYPE AND SCREEN
ABO/RH(D): O POS
Antibody Screen: NEGATIVE

## 2019-03-12 LAB — FIBRINOGEN: Fibrinogen: 439 mg/dL (ref 210–475)

## 2019-03-12 LAB — BASIC METABOLIC PANEL
Anion gap: 9 (ref 5–15)
BUN: 12 mg/dL (ref 6–20)
CO2: 28 mmol/L (ref 22–32)
Calcium: 8.7 mg/dL — ABNORMAL LOW (ref 8.9–10.3)
Chloride: 101 mmol/L (ref 98–111)
Creatinine, Ser: 1.36 mg/dL — ABNORMAL HIGH (ref 0.61–1.24)
GFR calc Af Amer: >60 mL/min (ref >60)
GFR calc non Af Amer: 59 mL/min — ABNORMAL LOW (ref >60)
Glucose, Bld: 132 mg/dL — ABNORMAL HIGH (ref 70–99)
Potassium: 3.6 mmol/L (ref 3.5–5.1)
Sodium: 138 mmol/L (ref 135–145)

## 2019-03-12 LAB — C-REACTIVE PROTEIN: CRP: <0.8 mg/dL (ref <1.0)

## 2019-03-12 LAB — TRIGLYCERIDES: Triglycerides: 83 mg/dL (ref <150)

## 2019-03-12 LAB — TROPONIN I
Troponin I: 0.06 ng/mL (ref <0.03)
Troponin I: 0.05 ng/mL (ref <0.03)

## 2019-03-12 LAB — PROTIME-INR
INR: 1 (ref 0.8–1.2)
Prothrombin Time: 13.5 seconds (ref 11.4–15.2)

## 2019-03-12 LAB — GLUCOSE, CAPILLARY: Glucose-Capillary: 157 mg/dL — ABNORMAL HIGH (ref 70–99)

## 2019-03-12 LAB — D-DIMER, QUANTITATIVE: D-Dimer, Quant: 1.08 ug/mL-FEU — ABNORMAL HIGH (ref 0.00–0.50)

## 2019-03-12 LAB — SARS CORONAVIRUS 2 BY RT PCR (HOSPITAL ORDER, PERFORMED IN ~~LOC~~ HOSPITAL LAB): SARS Coronavirus 2: POSITIVE — AB

## 2019-03-12 LAB — LACTATE DEHYDROGENASE: LDH: 213 U/L — ABNORMAL HIGH (ref 98–192)

## 2019-03-12 LAB — FERRITIN: Ferritin: 101 ng/mL (ref 24–336)

## 2019-03-12 LAB — SEDIMENTATION RATE: Sed Rate: 22 mm/hr — ABNORMAL HIGH (ref 0–16)

## 2019-03-12 LAB — APTT: aPTT: 28 seconds (ref 24–36)

## 2019-03-12 LAB — PROCALCITONIN: Procalcitonin: <0.1 ng/mL

## 2019-03-12 MED ORDER — IPRATROPIUM-ALBUTEROL 20-100 MCG/ACT IN AERS: 1.0000 | INHALATION_SPRAY | Freq: Four times a day (QID) | RESPIRATORY_TRACT | Status: DC | PRN

## 2019-03-12 MED ORDER — TRIAMCINOLONE ACETONIDE 0.1 % EX CREA
1.0000 "application " | TOPICAL_CREAM | Freq: Three times a day (TID) | CUTANEOUS | Status: DC | PRN
  Filled 2019-03-12: qty 15

## 2019-03-12 NOTE — Progress Notes (Deleted)
Report given to Brian in cath lab, pt has been NPO except sip with BP med, pt alert to self, CCMD called to inform to cath lab, stable to transport 

## 2019-03-12 NOTE — Progress Notes (Addendum)
Pt refused CBG,  PT Arguing for second rapid test which was notified to DR Blaine Hamper. PT refusing transfer. Gave report to green [place], [place], [phone].  CN was inform, will Pass on day shift  PT refusing to take his wt, refusing EKG.

## 2019-03-12 NOTE — TOC Transition Note (Signed)
Transition of Care [facility] - CM/SW Discharge Note   Patient Details  Name: Benjamin [name] MRN: [id number] Date of Birth: 20-Feb-1966  [facility] of [facility] CM/SW Contact:  Zenon Mayo, RN Phone Number: 03/12/2019, 1:30 PM   Clinical Narrative:    Patient from [employer] , will need home oxygen at dc today per MD.  Patient will be charity for home oxygen.  NCM made referral to [facility] for charity.     Final next level of care: Home/Self Care Barriers to Discharge: No Barriers Identified   Patient Goals and CMS Choice Patient states their goals for this hospitalization and ongoing recovery are:: to get better   Choice offered to / list presented to : NA  Discharge Placement                       Discharge Plan and Services                DME Arranged: Oxygen DME Agency: [employer] Date DME Agency Contacted: 03/12/19 Time DME Agency Contacted: 1330 Representative spoke with at DME Agency: zack HH Arranged: NA          Social Determinants of Health ([place]) Interventions     Readmission Risk Interventions No flowsheet data found.

## 2019-03-12 NOTE — Telephone Encounter (Signed)
Call received from Laurena Slimmer, RN CM who confirmed that the patient has been sent home with 2 O2 tanks from [employer] and they will be delivering a home concentrator today.

## 2019-03-12 NOTE — Telephone Encounter (Signed)
Request from Lewanda Rife CM for appointment for patient at [facility]. Patient is being discharged today. Informed her that an appointment has been scheduled for 03/18/2019 @ 1050.  It will be a televisit.   She confirmed that home O2 is being ordered through charity program

## 2019-03-12 NOTE — Progress Notes (Signed)
Pt transferred using precautions to 2w32, pt stable on tranfer via bed

## 2019-03-12 NOTE — Progress Notes (Signed)
Report called to [facility] at 2w, pt understands and agreeable, Jenny Reichmann verbalized understanding of awaiting 2nd resulkt

## 2019-03-12 NOTE — Progress Notes (Signed)
Pt introduced to nurse and advised of poc and that he had questions on 2nd test. I advised the night nurse has paged. He said he is not going to [facility] as has been there already for 2 days and sent home, he reports he had 3 tests that were covid neg and does not feel the test in ED was correct "she had 2 tubes, fiddled with them and I just dont feel its correct: advised I would page MD again to come speak to him, breakfast tray given

## 2019-03-12 NOTE — Progress Notes (Signed)
SATURATION QUALIFICATIONS: (This note is used to comply with regulatory documentation for home oxygen)  Patient Saturations on Room Air at Rest  93%  Patient Saturations on Room Air while Ambulating 86%  Patient Saturations on 2 Liters of oxygen while Ambulating 94%  Please briefly explain why patient needs home oxygen:  Pt requires supplemental oxygen due to reduced saturation while ambulating.

## 2019-03-12 NOTE — Discharge Summary (Signed)
Physician Discharge Summary  Logan Boresimothy R Siddoway RUE:[id number]:[id number] DOB: 11-Sep-1966 DOA: 03/11/2019  PCP: Claiborne RiggFleming, [name] W, NP  Admit date: 03/11/2019 Discharge date: 03/12/2019  Admitted From: Home  Disposition:  Home   Recommendations for Outpatient Follow-up:  1. Follow up with PCP in 3-4 days by phone 2. PCP: Please refer to Pulmonology and for sleep study 3. PCP: Please refer to Cardiology to establish care     Home Health: None  Equipment/Devices: O2  Discharge Condition: Fair  CODE STATUS: FULL Diet recommendation: Diabetic, cardiac  Brief/Interim Summary: Mr. Mayford KnifeWilliams is a 53 y.o. M with morbid obesity, OHS/OSA on CPAP and home O2, dCHF, HTN, DM, pAfib, recent PE (diagnosed after getting out of prison early May 2020), and IDA who presented with shortness of breath, sent by PCP after telemedicine visit.  Patient admitted 5/4 for dyspnea, found to have PE, started back on warfarin (he had stopped taking for his Afib).  Discharged after 3 days on Lovenox bridge to warfarin, no O2 needed.  Discharge wt 187.5kg.  Patient re-admitted 5/20 for syncope/cough/dyspnea, COVID+, CT positive for bilateral GGOs, admitted to [facility], discharged after 6 days, on O2.  Rec'd Cards f/u.  Patient re-admitted 6/3 for bright red blood per rectum, eval'ed by GI, had EGD/colon, dx'd with hemorrhoidal bleeding, discharged after 9 days on O2.  After second readmission, home O2 ordered neglected, and patient somehow didn't have it.  He was home evidently for about 2 weeks, felt dyspnea with exertion, finally had telemedicine visit with PCP who thought he sounded audibly out of breath, so told him to go to the ER.  In ER, CXR clear, normal SpO2 at rest, BNP normal.  Hgb stable from discharge.  Weight stable from previous discharges.      PRINCIPAL HOSPITAL DIAGNOSIS:  Dyspnea    Discharge Diagnoses:   Dyspnea Patient referred to the ER for dyspnea.  There, patient reported his symptoms were at  baseline, but because of his complicated PMHx, low level troponin elevation, and absence of needed O2 at home, it was recommended that he be observed overnight.  To me, the patient states that he is at his baseline, that he has symptoms of dyspnea with exertion, but that this is chronic not worse than previous.    [facility], it is unclear why he doesn't still have that equipment at home and what happened to it during his second hospitalization.   Here, CXR clear.  Hgb normal.  Troponin chronically elevated.  Suspect that his dyspnea is simply NYHA II-III dCHF as well as MO/OHS and pulmonary hypertension, that he is at his baseline, and is safe for discharge.   Positive SARS-CoV-2 RT-PCR The patient is convalescing from COVID-19, which he had in early May.  He is recovered, has no recent fever, cough, body aches.  His positive PCR last night does not indicate illness, but is residual viral sloughing from last month.  He should not resume self-isolation.  Pulmonary thromboembolism -Continue Xarelto  Diabetes mellitus type 2   Chronic diastolic CHF (congestive heart failure) Reports symptoms are at baseline, Class II to III.  Weight 188, in line with recent discharge weights.  PAF (paroxysmal atrial fibrillation) CHA2DS2-Vasc 3, on Xarelto.  Iron deficiency anemia Hgb stable   OSA -Continue CPAP  Elevated troponin Denies symptoms of new chest pain, no new dyspnea.   -Recommend Cardiology follow up.           Discharge Instructions  Discharge Instructions    Diet - low sodium heart healthy   Complete by: As directed    Discharge instructions   Complete by: As directed    Resume your home medicines.  Call [name] Fleming's office to confirm the time of your next appointment.  Ask her about a referral to Pulmonology  If you still need it, please arrange a follow up with a surgeon about your hemorrhoids.   Increase  activity slowly   Complete by: As directed      Allergies as of 03/12/2019   No Known Allergies     Medication List    TAKE these medications   amLODipine 10 MG tablet Commonly known as: NORVASC Take 10 mg by mouth every morning.   atorvastatin 20 MG tablet Commonly known as: LIPITOR Take 20 mg by mouth at bedtime.   carboxymethylcellulose 0.5 % Soln Commonly known as: REFRESH PLUS Place 1 drop into both eyes daily.   COUGH DROPS MT Use as directed 1 lozenge in the mouth or throat as needed (for throat dryness).   docusate sodium 100 MG capsule Commonly known as: COLACE Take 1 capsule (100 mg total) by mouth 2 (two) times daily.   ferrous sulfate 325 (65 FE) MG tablet Take 1 tablet (325 mg total) by mouth daily for 30 days.   furosemide 80 MG tablet Commonly known as: LASIX Take 80 mg by mouth 3 (three) times daily.   labetalol 200 MG tablet Commonly known as: NORMODYNE Take 200 mg by mouth every morning.   lisinopril 40 MG tablet Commonly known as: ZESTRIL Take 40 mg by mouth every morning.   metFORMIN 500 MG tablet Commonly known as: GLUCOPHAGE Take 500 mg by mouth 2 (two) times a day.   methocarbamol 500 MG tablet Commonly known as: ROBAXIN Take 1 tablet (500 mg total) by mouth every 8 (eight) hours as needed for muscle spasms.   omeprazole 40 MG capsule Commonly known as: PRILOSEC Take 40 mg by mouth every morning.   polyethylene glycol 17 g packet Commonly known as: MIRALAX / GLYCOLAX Take 17 g by mouth daily as needed for severe constipation.   spironolactone 50 MG tablet Commonly known as: ALDACTONE Take 50 mg by mouth every morning.   triamcinolone cream 0.1 % Commonly known as: KENALOG Apply 1 application topically 3 (three) times daily as needed (rash/irritation).   XARELTO PO Take 1 tablet by mouth daily.            Durable Medical Equipment  (From admission, onward)         Start     Ordered   03/12/19 1422  DME Oxygen   Once    Question Answer Comment  Length of Need Lifetime   Mode or (Route) Nasal cannula   Liters per Minute 2   Frequency Continuous (stationary and portable oxygen unit needed)   Oxygen conserving device Yes   Oxygen delivery system Gas      03/12/19 1422         Follow-up Information     [facility] Follow up.   Why: please call to schedule a telephonic visit Contact information: [address] [place] [id number] [phone]       [facility] Follow up.   Why: oxygen         No Known Allergies  Consultations:  None   Procedures/Studies: Dg Chest 2 View  Result Date: 03/11/2019 CLINICAL DATA:  Shortness of breath. EXAM:  CHEST - 2 VIEW COMPARISON:  CT chest and chest x-ray dated February 26, 2019. FINDINGS: Unchanged borderline cardiomegaly. Normal mediastinal contours. Normal pulmonary vascularity. Unchanged minimal bibasilar atelectasis. No focal consolidation, pleural effusion, or pneumothorax. No acute osseous abnormality. IMPRESSION: No active cardiopulmonary disease. Electronically Signed   By: Obie Dredge M.D.   On: 03/11/2019 17:04   Dg Chest 2 View  Result Date: 02/26/2019 CLINICAL DATA:  Fatigue, short of breath EXAM: CHEST - 2 VIEW COMPARISON:  CT 02/06/2019, chest radiograph 02/06/2019 FINDINGS: Normal cardiac silhouette. A subtle airspace density in the RIGHT lower lobe again noted. Otherwise lungs are clear. No effusion. IMPRESSION: Persistent RIGHT lower lobe pneumonia.  No worsening. Electronically Signed   By: Genevive Bi M.D.   On: 02/26/2019 10:39   Ct Angio Chest Pe W Or Wo Contrast  Result Date: 02/26/2019 CLINICAL DATA:  Shortness of breath.  History of pulmonary embolism. EXAM: CT ANGIOGRAPHY CHEST WITH CONTRAST TECHNIQUE: Multidetector CT imaging of the chest was performed using the standard protocol during bolus administration of intravenous contrast. Multiplanar CT image  reconstructions and MIPs were obtained to evaluate the vascular anatomy. CONTRAST:  77mL OMNIPAQUE IOHEXOL 350 MG/ML SOLN COMPARISON:  CTA chest dated Feb 06, 2019. FINDINGS: Cardiovascular: Poor opacification of the pulmonary arteries with non-diagnostic evaluation of the lobar and segmental pulmonary arteries. No central pulmonary embolism. Unchanged mild dilatation of the main pulmonary artery measuring up to 3.6 cm in diameter. Unchanged borderline cardiomegaly. No pericardial effusion. No thoracic aortic aneurysm or dissection. Mediastinum/Nodes: No enlarged mediastinal, hilar, or axillary lymph nodes. Thyroid gland, trachea, and esophagus demonstrate no significant findings. Lungs/Pleura: Previously seen multifocal ground-glass opacities throughout both lungs have resolved. No focal consolidation, pleural effusion, or pneumothorax. Dependent subsegmental atelectasis in both lungs. Upper Abdomen: No acute abnormality. Musculoskeletal: No chest wall abnormality. No acute or significant osseous findings. Review of the MIP images confirms the above findings. IMPRESSION: 1. Poor opacification of the pulmonary arteries with non-diagnostic evaluation of the lobar and segmental pulmonary arteries. No central pulmonary embolism. Consider V/Q scan for further evaluation if there is continued concern for pulmonary embolism. 2. Resolved multilobar pneumonia. Electronically Signed   By: Obie Dredge M.D.   On: 02/26/2019 12:37   Nm Gi Blood Loss  Result Date: 02/23/2019 CLINICAL DATA:  Bright red blood per rectum EXAM: NUCLEAR MEDICINE GASTROINTESTINAL BLEEDING SCAN TECHNIQUE: Sequential abdominal images were obtained following intravenous administration of Tc-14m labeled red blood cells. RADIOPHARMACEUTICALS:  21.2 mCi Tc-32m pertechnetate in-vitro labeled red cells. COMPARISON:  None. FINDINGS: Sequential anterior planar scintigraphic images of the abdomen are reviewed through 120 minutes. There is localization of  tagged RBCs to blood pool structures and minimal urinary excretion related to free pertechnetate. No evidence of GI lumen localization or peristalsis to suggest nidus of GI bleeding. IMPRESSION: Negative nuclear scintigraphic GI bleeding examination through 120 minutes. No nidus of GI bleeding identified. Electronically Signed   By: Lauralyn Primes M.D.   On: 02/23/2019 22:21   Nm Pulmonary Perfusion  Result Date: 02/26/2019 CLINICAL DATA:  Shortness of breath, productive cough, recent diagnosis of pulmonary embolism by CTA chest exam on 01/21/2019 EXAM: NUCLEAR MEDICINE PERFUSION LUNG SCAN TECHNIQUE: Perfusion images were obtained in multiple projections after intravenous injection of radiopharmaceutical. Ventilation scans intentionally deferred if perfusion scan and chest x-ray adequate for interpretation during COVID 19 epidemic. RADIOPHARMACEUTICALS:  1.8 mCi Tc-35m MAA IV COMPARISON:  None Correlation: CTA chest exams 02/26/2019, 02/06/2019, 01/21/2019 FINDINGS: Ventilation exam not performed. Perfusion images demonstrate  normal tracer distribution in the lungs bilaterally. No segmental or subsegmental perfusion defects identified. Specifically, no evidence of pulmonary emboli in the lower lobes as noted on an earlier CT a exam. IMPRESSION: Normal perfusion lung scan. Electronically Signed   By: Lavonia Dana M.D.   On: 02/26/2019 17:38   Vas [place] Lower Extremity Venous (dvt)  Result Date: 02/22/2019  Lower Venous Study Indications: Swelling.  Limitations: Body habitus and poor ultrasound/tissue interface. Performing Technologist: Oliver Hum RVT  Examination Guidelines: A complete evaluation includes B-mode imaging, spectral Doppler, color Doppler, and power Doppler as needed of all accessible portions of each vessel. Bilateral testing is considered an integral part of a complete examination. Limited examinations for reoccurring indications may be performed as noted.   +---------+---------------+---------+-----------+----------+-------+ RIGHT    CompressibilityPhasicitySpontaneityPropertiesSummary +---------+---------------+---------+-----------+----------+-------+ CFV      Full           Yes      Yes                          +---------+---------------+---------+-----------+----------+-------+ SFJ      Full                                                 +---------+---------------+---------+-----------+----------+-------+ FV Prox  Full                                                 +---------+---------------+---------+-----------+----------+-------+ FV Mid                  Yes      Yes                          +---------+---------------+---------+-----------+----------+-------+ FV Distal               Yes      Yes                          +---------+---------------+---------+-----------+----------+-------+ PFV      Full                                                 +---------+---------------+---------+-----------+----------+-------+ POP      Full           Yes      Yes                          +---------+---------------+---------+-----------+----------+-------+ PTV      Full                                                 +---------+---------------+---------+-----------+----------+-------+ PERO     Full                                                 +---------+---------------+---------+-----------+----------+-------+   +---------+---------------+---------+-----------+----------+--------------+  LEFT     CompressibilityPhasicitySpontaneityPropertiesSummary        +---------+---------------+---------+-----------+----------+--------------+ CFV      Full           Yes      Yes                                 +---------+---------------+---------+-----------+----------+--------------+ SFJ      Full                                                         +---------+---------------+---------+-----------+----------+--------------+ FV Prox  Full                                                        +---------+---------------+---------+-----------+----------+--------------+ FV Mid                  Yes      Yes                                 +---------+---------------+---------+-----------+----------+--------------+ FV Distal                                             Not visualized +---------+---------------+---------+-----------+----------+--------------+ PFV      Full                                                        +---------+---------------+---------+-----------+----------+--------------+ POP      Full           Yes      Yes                                 +---------+---------------+---------+-----------+----------+--------------+ PTV      Full                                                        +---------+---------------+---------+-----------+----------+--------------+ PERO                                                  Not visualized +---------+---------------+---------+-----------+----------+--------------+     Summary: Right: There is no evidence of deep vein thrombosis in the lower extremity. However, portions of this examination were limited- see technologist comments above. No cystic structure found in the popliteal fossa. Left: There is no evidence of deep vein thrombosis in the lower extremity. However, portions of this examination were limited- see technologist comments above.  No cystic structure found in the popliteal fossa.  *See table(s) above for measurements and observations. Electronically signed by Lemar Livings MD on 02/22/2019 at 4:26:49 PM.    Final       Subjective: Feeling well.  Dyspneic with exertion and leg sewlling, but at baseline.  No fever, cough.  No diarrhea, aches.  Discharge Exam: Vitals:   03/12/19 0900 03/12/19 1057  BP: (!) 154/99 (!) 149/85  Pulse: 90 85   Resp:    Temp: 98.1 F (36.7 C) 98.1 F (36.7 C)  SpO2: 98% 96%   Vitals:   03/12/19 0046 03/12/19 0840 03/12/19 0900 03/12/19 1057  BP: (!) 148/88  (!) 154/99 (!) 149/85  Pulse: 93 91 90 85  Resp: 18 20    Temp: 98.4 F (36.9 C)  98.1 F (36.7 C) 98.1 F (36.7 C)  TempSrc: Oral  Oral Oral  SpO2: 96% 92% 98% 96%  Weight:      Height:        General: Pt is alert, awake, not in acute distress Cardiovascular: RRR, nl S1-S2, no murmurs appreciated.  Brawny chronic LE edema.   Respiratory: Normal respiratory rate and rhythm.  CTAB without rales or wheezes. Abdominal: Abdomen soft and non-tender.  No distension or HSM.   Neuro/Psych: Strength symmetric in upper and lower extremities.  Judgment and insight appear normal.   The results of significant diagnostics from this hospitalization (including imaging, microbiology, ancillary and laboratory) are listed below for reference.     Microbiology: Recent Results (from the past 240 hour(s))  SARS Coronavirus 2 (CEPHEID - Performed in [facility] hospital lab), Hosp Order     Status: Abnormal   Collection Time: 03/11/19 11:18 PM   Specimen: Nasopharyngeal Swab  Result Value Ref Range Status   SARS Coronavirus 2 POSITIVE (A) NEGATIVE Final    Comment: RESULT CALLED TO, READ BACK BY AND VERIFIED WITH: L NITURADA RN 03/12/19 0019 JDW (NOTE) If result is NEGATIVE SARS-CoV-2 target nucleic acids are NOT DETECTED. The SARS-CoV-2 RNA is generally detectable in upper and lower  respiratory specimens during the acute phase of infection. The lowest  concentration of SARS-CoV-2 viral copies this assay can detect is 250  copies / mL. A negative result does not preclude SARS-CoV-2 infection  and should not be used as the sole basis for treatment or other  patient management decisions.  A negative result may occur with  improper specimen collection / handling, submission of specimen other  than nasopharyngeal swab, presence of viral  mutation(s) within the  areas targeted by this assay, and inadequate number of viral copies  (<250 copies / mL). A negative result must be combined with clinical  observations, patient history, and epidemiological information. If result is POSITIVE SARS-CoV-2 target nucleic acids are DETECTED. The SA RS-CoV-2 RNA is generally detectable in upper and lower  respiratory specimens during the acute phase of infection.  Positive  results are indicative of active infection with SARS-CoV-2.  Clinical  correlation with patient history and other diagnostic information is  necessary to determine patient infection status.  Positive results do  not rule out bacterial infection or co-infection with other viruses. If result is PRESUMPTIVE POSTIVE SARS-CoV-2 nucleic acids MAY BE PRESENT.   A presumptive positive result was obtained on the submitted specimen  and confirmed on repeat testing.  While 2019 novel coronavirus  (SARS-CoV-2) nucleic acids may be present in the submitted sample  additional confirmatory testing may be necessary for epidemiological  and /  or clinical management purposes  to differentiate between  SARS-CoV-2 and other Sarbecovirus currently known to infect humans.  If clinically indicated additional testing with an alternate test  methodology [phone]) is advi sed. The SARS-CoV-2 RNA is generally  detectable in upper and lower respiratory specimens during the acute  phase of infection. The expected result is Negative. Fact Sheet for Patients:  BoilerBrush.com.cy Fact Sheet for Healthcare Providers: https://pope.com/ This test is not yet approved or cleared by the [place] FDA and has been authorized for detection and/or diagnosis of SARS-CoV-2 by FDA under an Emergency Use Authorization (EUA).  This EUA will remain in effect (meaning this test can be used) for the duration of the COVID-19 declaration under Section 564(b)(1)  of the Act, 21 U.S.C. section 360bbb-3(b)(1), unless the authorization is terminated or revoked sooner. Performed at [facility], [address] [place], [place] [address]      Labs: BNP (last 3 results) Recent Labs    01/21/19 1704 02/06/19 1158 03/11/19 1654  BNP 23.2 15.8 19.9   Basic Metabolic Panel: Recent Labs  Lab 03/11/19 1644 03/12/19 1042  NA 138 138  K 3.6 3.6  CL 103 101  CO2 25 28  GLUCOSE 97 132*  BUN 12 12  CREATININE 1.17 1.36*  CALCIUM 8.7* 8.7*   Liver Function Tests: Recent Labs  Lab 03/12/19 1042  AST 18  ALT 19  ALKPHOS 48  BILITOT 0.2*  PROT 7.3  ALBUMIN 3.4*   No results for input(s): LIPASE, AMYLASE in the last 168 hours. No results for input(s): AMMONIA in the last 168 hours. CBC: Recent Labs  Lab 03/11/19 1644  WBC 10.3  HGB 8.8*  HCT 29.9*  MCV 84.2  PLT 368   Cardiac Enzymes: Recent Labs  Lab 03/11/19 1644 03/11/19 2003 03/12/19 0229 03/12/19 1042  TROPONINI 0.06* 0.06* 0.06* 0.05*   BNP: Invalid input(s): POCBNP CBG: Recent Labs  Lab 03/11/19 2123 03/12/19 1127  GLUCAP 115* 157*   D-Dimer Recent Labs    03/12/19 1042  DDIMER 1.08*   Hgb A1c No results for input(s): HGBA1C in the last 72 hours. Lipid Profile Recent Labs    03/12/19 1042  TRIG 83   Thyroid function studies No results for input(s): TSH, T4TOTAL, T3FREE, THYROIDAB in the last 72 hours.  Invalid input(s): FREET3 Anemia work up Recent Labs    03/12/19 1042  FERRITIN 101   Urinalysis    Component Value Date/Time   COLORURINE YELLOW 02/07/2019 1042   APPEARANCEUR HAZY (A) 02/07/2019 1042   LABSPEC 1.043 (H) 02/07/2019 1042   PHURINE 5.0 02/07/2019 1042   GLUCOSEU NEGATIVE 02/07/2019 1042   HGBUR NEGATIVE 02/07/2019 1042   BILIRUBINUR NEGATIVE 02/07/2019 1042   KETONESUR NEGATIVE 02/07/2019 1042   PROTEINUR 30 (A) 02/07/2019 1042   NITRITE NEGATIVE 02/07/2019 1042   LEUKOCYTESUR NEGATIVE 02/07/2019 1042   Sepsis  Labs Invalid input(s): PROCALCITONIN,  WBC,  LACTICIDVEN Microbiology Recent Results (from the past 240 hour(s))  SARS Coronavirus 2 (CEPHEID - Performed in [facility] hospital lab), Hosp Order     Status: Abnormal   Collection Time: 03/11/19 11:18 PM   Specimen: Nasopharyngeal Swab  Result Value Ref Range Status   SARS Coronavirus 2 POSITIVE (A) NEGATIVE Final    Comment: RESULT CALLED TO, READ BACK BY AND VERIFIED WITH: L NITURADA RN 03/12/19 0019 JDW (NOTE) If result is NEGATIVE SARS-CoV-2 target nucleic acids are NOT DETECTED. The SARS-CoV-2 RNA is generally detectable in upper and lower  respiratory specimens during the acute phase of infection. The lowest  concentration of SARS-CoV-2 viral copies this assay can detect is 250  copies / mL. A negative result does not preclude SARS-CoV-2 infection  and should not be used as the sole basis for treatment or other  patient management decisions.  A negative result may occur with  improper specimen collection / handling, submission of specimen other  than nasopharyngeal swab, presence of viral mutation(s) within the  areas targeted by this assay, and inadequate number of viral copies  (<250 copies / mL). A negative result must be combined with clinical  observations, patient history, and epidemiological information. If result is POSITIVE SARS-CoV-2 target nucleic acids are DETECTED. The SA RS-CoV-2 RNA is generally detectable in upper and lower  respiratory specimens during the acute phase of infection.  Positive  results are indicative of active infection with SARS-CoV-2.  Clinical  correlation with patient history and other diagnostic information is  necessary to determine patient infection status.  Positive results do  not rule out bacterial infection or co-infection with other viruses. If result is PRESUMPTIVE POSTIVE SARS-CoV-2 nucleic acids MAY BE PRESENT.   A presumptive positive result was obtained on the submitted specimen   and confirmed on repeat testing.  While 2019 novel coronavirus  (SARS-CoV-2) nucleic acids may be present in the submitted sample  additional confirmatory testing may be necessary for epidemiological  and / or clinical management purposes  to differentiate between  SARS-CoV-2 and other Sarbecovirus currently known to infect humans.  If clinically indicated additional testing with an alternate test  methodology [phone]) is advi sed. The SARS-CoV-2 RNA is generally  detectable in upper and lower respiratory specimens during the acute  phase of infection. The expected result is Negative. Fact Sheet for Patients:  BoilerBrush.com.cy Fact Sheet for Healthcare Providers: https://pope.com/ This test is not yet approved or cleared by the [place] FDA and has been authorized for detection and/or diagnosis of SARS-CoV-2 by FDA under an Emergency Use Authorization (EUA).  This EUA will remain in effect (meaning this test can be used) for the duration of the COVID-19 declaration under Section 564(b)(1) of the Act, 21 U.S.C. section 360bbb-3(b)(1), unless the authorization is terminated or revoked sooner. Performed at [facility], [address] [place], [place] [address]      Time coordinating discharge: 25 minutes      SIGNED:   Alberteen Sam, MD  [facility] 03/12/2019, 2:47 PM

## 2019-03-12 NOTE — ED Provider Notes (Signed)
[facility] Provider Note   CSN: [id number] Arrival date & time: 03/11/19  1632     History   Chief Complaint Chief Complaint  Patient presents with  . Shortness of Breath    HPI Benjamin [name] is a 53 y.o. male.     The history is provided by the patient.  Shortness of Breath Severity:  Moderate Onset quality:  Gradual Timing:  Constant Progression:  Worsening Chronicity:  New Context: not activity and not emotional upset   Relieved by:  None tried Worsened by:  Nothing   Past Medical History:  Diagnosis Date  . A-fib ([place])   . Diabetes mellitus without complication ([place])   . Elevated troponin 02/06/2019  . Hypertension   . Obesity   . Pulmonary embolism ([place])   . Sleep apnea     Patient Active Problem List   Diagnosis Date Noted  . Iron deficiency anemia 03/11/2019  . OSA on CPAP 03/11/2019  . HLD (hyperlipidemia) 03/11/2019  . Elevated troponin 03/11/2019  . GERD (gastroesophageal reflux disease) 03/11/2019  . Rectal bleeding 02/20/2019  . Dyspnea 02/06/2019  . COVID-19 virus infection 02/06/2019  . Bilateral lower extremity edema   . Obesity, Class III, BMI 40-49.9 (morbid obesity) ([place])   . Chronic diastolic CHF (congestive heart failure) ([place])   . PAF (paroxysmal atrial fibrillation) ([place])   . PE (pulmonary thromboembolism) ([place]) 01/21/2019  . Hypertensive urgency 01/21/2019  . Diabetes mellitus type 2 in obese ([place]) 01/21/2019    Past Surgical History:  Procedure Laterality Date  . COLONOSCOPY WITH PROPOFOL Left 02/25/2019   Procedure: COLONOSCOPY WITH PROPOFOL;  Surgeon: Arta Silence, MD;  Location: [facility];  Service: Endoscopy;  Laterality: Left;  . ESOPHAGOGASTRODUODENOSCOPY (EGD) WITH PROPOFOL Left 02/25/2019   Procedure: ESOPHAGOGASTRODUODENOSCOPY (EGD) WITH PROPOFOL;  Surgeon: Arta Silence, MD;  Location: [facility] ENDOSCOPY;  Service: Endoscopy;  Laterality: Left;  . NO PAST SURGERIES          Home  Medications    Prior to Admission medications   Medication Sig Start Date End Date Taking? Authorizing Provider  amLODipine (NORVASC) 10 MG tablet Take 10 mg by mouth every morning.  01/03/19  Yes [provider]  atorvastatin (LIPITOR) 20 MG tablet Take 20 mg by mouth at bedtime.  01/03/19  Yes [provider]  carboxymethylcellulose (REFRESH PLUS) 0.5 % SOLN Place 1 drop into both eyes daily.    Yes [provider]  docusate sodium (COLACE) 100 MG capsule Take 1 capsule (100 mg total) by mouth 2 (two) times daily. 03/01/19  Yes Vasireddy, Grier Mitts, MD  furosemide (LASIX) 80 MG tablet Take 80 mg by mouth 3 (three) times daily. 01/03/19  Yes [provider]  labetalol (NORMODYNE) 200 MG tablet Take 200 mg by mouth every morning.  01/03/19  Yes [provider]  lisinopril (ZESTRIL) 40 MG tablet Take 40 mg by mouth every morning.  01/03/19  Yes [provider]  metFORMIN (GLUCOPHAGE) 500 MG tablet Take 500 mg by mouth 2 (two) times a day. 01/03/19  Yes [provider]  methocarbamol (ROBAXIN) 500 MG tablet Take 1 tablet (500 mg total) by mouth every 8 (eight) hours as needed for muscle spasms. 02/14/19  Yes Thurnell Lose, MD  omeprazole (PRILOSEC) 40 MG capsule Take 40 mg by mouth every morning.  01/03/19  Yes [provider]  polyethylene glycol (MIRALAX / GLYCOLAX) 17 g packet Take 17 g by mouth daily as needed for severe  constipation. 03/01/19  Yes Vasireddy, Clerance Lav, MD  Rivaroxaban (XARELTO PO) Take 1 tablet by mouth daily.   Yes [provider]  spironolactone (ALDACTONE) 50 MG tablet Take 50 mg by mouth every morning.  01/03/19  Yes [provider]  Throat Lozenges (COUGH DROPS MT) Use as directed 1 lozenge in the mouth or throat as needed (for throat dryness).    Yes [provider]  triamcinolone cream (KENALOG) 0.1 % Apply 1 application topically 3 (three) times daily as needed (rash/irritation).   02/01/19  Yes [provider]  ferrous sulfate 325 (65 FE) MG tablet Take 1 tablet (325 mg total) by mouth daily for 30 days. 02/25/19 03/27/19  Zigmund Daniel., MD    Family History Family History  Problem Relation Age of Onset  . Hypertension Mother   . Diabetes Mother     Social History Social History   Tobacco Use  . Smoking status: Former Games developer  . Smokeless tobacco: Never Used  Substance Use Topics  . Alcohol use: Not Currently  . Drug use: Not Currently     Allergies   Patient has no known allergies.   Review of Systems Review of Systems  Respiratory: Positive for shortness of breath.   All other systems reviewed and are negative.    Physical Exam Updated Vital Signs BP (!) 148/88 (BP Location: Left Arm)   Pulse 93   Temp 98.4 F (36.9 C) (Oral)   Resp 18   Ht 5\' 11"  (1.803 m)   Wt (!) 188.8 kg   SpO2 96%   BMI 58.06 kg/m   Physical Exam Vitals signs and nursing note reviewed.  Constitutional:      Appearance: He is well-developed.  HENT:     Head: Normocephalic and atraumatic.     Mouth/Throat:     Mouth: Mucous membranes are dry.     Pharynx: Oropharynx is clear.  Eyes:     Conjunctiva/sclera: Conjunctivae normal.  Neck:     Musculoskeletal: Normal range of motion.  Cardiovascular:     Rate and Rhythm: Tachycardia present.  Pulmonary:     Effort: Pulmonary effort is normal. Tachypnea present. No respiratory distress.     Breath sounds: Decreased breath sounds and wheezing present.  Chest:     Chest wall: No tenderness.  Abdominal:     General: There is no distension.  Musculoskeletal: Normal range of motion.  Skin:    General: Skin is warm and dry.  Neurological:     General: No focal deficit present.     Mental Status: He is alert.      ED Treatments / Results  Labs (all labs ordered are listed, but only abnormal results are displayed) Labs Reviewed  SARS CORONAVIRUS 2 (HOSPITAL ORDER, PERFORMED IN [facility]) - Abnormal; Notable for the following components:      Result Value   SARS Coronavirus 2 POSITIVE (*)    All other components within normal limits  BASIC METABOLIC PANEL - Abnormal; Notable for the following components:   Calcium 8.7 (*)    All other components within normal limits  CBC - Abnormal; Notable for the following components:   RBC 3.55 (*)    Hemoglobin 8.8 (*)    HCT 29.9 (*)    MCH 24.8 (*)    MCHC 29.4 (*)    RDW 17.6 (*)    All other components within normal limits  TROPONIN I - Abnormal; Notable for the following components:  Troponin I 0.06 (*)    All other components within normal limits  TROPONIN I - Abnormal; Notable for the following components:   Troponin I 0.06 (*)    All other components within normal limits  GLUCOSE, CAPILLARY - Abnormal; Notable for the following components:   Glucose-Capillary 115 (*)    All other components within normal limits  NOVEL CORONAVIRUS, NAA (HOSPITAL ORDER, SEND-OUT TO REF LAB)  BRAIN NATRIURETIC PEPTIDE  OCCULT BLOOD X 1 CARD TO LAB, STOOL  TROPONIN I  TROPONIN I  BASIC METABOLIC PANEL  PROTIME-INR  APTT    EKG EKG Interpretation  Date/Time:  Monday March 11 2019 16:41:37 EDT Ventricular Rate:  93 PR Interval:  140 QRS Duration: 96 QT Interval:  306 QTC Calculation: 380 R Axis:   62 Text Interpretation:  Normal sinus rhythm T wave abnormality, consider inferolateral ischemia Abnormal ECG inverted Twaves in inferior leads and V5-6 are new since may Confirmed by Marily MemosMesner, Antario Yasuda [phone]([id number]) on 03/11/2019 6:19:52 PM   Radiology Dg Chest 2 View  Result Date: 03/11/2019 CLINICAL DATA:  Shortness of breath. EXAM: CHEST - 2 VIEW COMPARISON:  CT chest and chest x-ray dated February 26, 2019. FINDINGS: Unchanged borderline cardiomegaly. Normal mediastinal contours. Normal pulmonary vascularity. Unchanged minimal bibasilar atelectasis. No focal consolidation, pleural effusion, or pneumothorax. No acute osseous  abnormality. IMPRESSION: No active cardiopulmonary disease. Electronically Signed   By: Obie DredgeWilliam T Derry M.D.   On: 03/11/2019 17:04    Procedures Procedures (including critical care time)  Medications Ordered in ED Medications  albuterol (VENTOLIN HFA) 108 (90 Base) MCG/ACT inhaler 2 puff (has no administration in time range)  dextromethorphan-guaiFENesin (MUCINEX DM) 30-600 MG per 12 hr tablet 1 tablet (has no administration in time range)  Ipratropium-Albuterol (COMBIVENT) respimat 1 puff (1 puff Inhalation Not Given 03/11/19 2204)  sodium chloride flush (NS) 0.9 % injection 3 mL (has no administration in time range)  sodium chloride flush (NS) 0.9 % injection 3 mL (has no administration in time range)  0.9 %  sodium chloride infusion (has no administration in time range)  acetaminophen (TYLENOL) tablet 650 mg (has no administration in time range)  ondansetron (ZOFRAN) injection 4 mg (has no administration in time range)  insulin aspart (novoLOG) injection 0-9 Units (has no administration in time range)  insulin aspart (novoLOG) injection 0-5 Units (0 Units Subcutaneous Not Given 03/11/19 2307)  nitroGLYCERIN (NITROSTAT) SL tablet 0.4 mg (has no administration in time range)  morphine 2 MG/ML injection 2 mg (has no administration in time range)  hydrALAZINE (APRESOLINE) injection 5 mg (has no administration in time range)  amLODipine (NORVASC) tablet 10 mg (has no administration in time range)  atorvastatin (LIPITOR) tablet 20 mg (20 mg Oral Given 03/11/19 2231)  furosemide (LASIX) tablet 80 mg (80 mg Oral Given 03/11/19 2231)  labetalol (NORMODYNE) tablet 200 mg (has no administration in time range)  lisinopril (ZESTRIL) tablet 40 mg (has no administration in time range)  spironolactone (ALDACTONE) tablet 50 mg (has no administration in time range)  docusate sodium (COLACE) capsule 100 mg (100 mg Oral Given 03/11/19 2231)  pantoprazole (PROTONIX) EC tablet 40 mg (has no administration in  time range)  polyethylene glycol (MIRALAX / GLYCOLAX) packet 17 g (has no administration in time range)  ferrous sulfate tablet 325 mg (325 mg Oral Not Given 03/11/19 2306)  methocarbamol (ROBAXIN) tablet 500 mg (has no administration in time range)  rivaroxaban (XARELTO) tablet 20 mg (has no administration in time range)  polyvinyl alcohol (  LIQUIFILM TEARS) 1.4 % ophthalmic solution 1 drop (1 drop Both Eyes Given 03/11/19 2232)  sodium chloride flush (NS) 0.9 % injection 3 mL (3 mLs Intravenous Given 03/11/19 1846)  albuterol (VENTOLIN HFA) 108 (90 Base) MCG/ACT inhaler 2 puff (2 puffs Inhalation Given 03/11/19 1951)     Initial Impression / Assessment and Plan / ED Course  I have reviewed the triage vital signs and the nursing notes.  Pertinent labs & imaging results that were available during my care of the patient were reviewed by me and considered in my medical decision making (see chart for details).        Heart failure vs covid vs PE. Needs oxygen, can't get in ED, will admit for same.   Final Clinical Impressions(s) / ED Diagnoses   Final diagnoses:  None    ED Discharge Orders    None       Salah Burlison, Barbara CowerJason, MD 03/12/19 [phone]

## 2019-03-13 ENCOUNTER — Telehealth: Payer: Self-pay

## 2019-03-13 LAB — INTERLEUKIN-6, PLASMA: Interleukin-6, Plasma: 5.5 pg/mL (ref 0.0–12.2)

## 2019-03-13 LAB — NOVEL CORONAVIRUS, NAA (HOSP ORDER, SEND-OUT TO REF LAB; TAT 18-24 HRS): SARS-CoV-2, NAA: NOT DETECTED

## 2019-03-13 LAB — HEPATITIS B SURFACE ANTIGEN: Hepatitis B Surface Ag: NEGATIVE

## 2019-03-13 LAB — HIV ANTIBODY (ROUTINE TESTING W REFLEX): HIV Screen 4th Generation wRfx: NONREACTIVE

## 2019-03-13 NOTE — Telephone Encounter (Signed)
Transition Care Management Follow-up Telephone Call  Date of discharge and from where: 03/12/2019, [facility]   How have you been since you were released from the hospital? He said that he is " feeling good" a lot better since he received his O2.   Any questions or concerns? His questions were regarding disability and medicaid.  He explained that he is not sure how to apply for either. Instructed him to call [employer]SA regarding disability and to call DSS or go on line for medicaid application. At his request, this information was provided to Ms [employer] employee of his residence. Informed him that this CM can provide him with a medicaid application if someone can come to the clinic to pick it up for him. He said that his probation officer can help him with these applications.   He said that he was told at the hospital that he does not need to be in quarantine any longer.   No bleeding reported. He said " all that stopped."   Items Reviewed:  Did the pt receive and understand the discharge instructions provided? He said that he has the instructions and has no questions.   Medications obtained and verified? He said that he has all medications and did not need to review, he knows what to do but may need assistance when refills are needed. Marland Kitchen He said that he does not have any difficulty reading the medication instructions.  He explained that he has 2 medications that he was given at the hospital and they are not on his AVS - albuterol inhaler that he uses as needed and combivent that he uses 4 times daily.  He explained how he pushes and turns the combivent and said that he breathes very well after he has used these inhalers.  Any new allergies since your discharge? None reported   Do you have support at home? Currently living in a half way house. He said that he is ' downstairs" with 3 other men and there are about 50 men upstairs but he does not go there.   Other (ie: DME, Home Health, etc) O2 @  2L continuously  from [facility]   Functional Questionnaire: (I = Independent and D = Dependent) ADL's: independent.  Staff at his residence will assist if needed. No assistive device needed for ambulation,    Follow up appointments reviewed:    PCP Hospital f/u appt confirmed? 03/18/2019 @ 1050 telelvisit.   [facility] f/u appt confirmed? Needs to schedule these appointments  Are transportation arrangements needed? He said that he will need to arrange this on his own.   If their condition worsens, is the pt aware to call  their PCP or go to the ED? yes  Was the patient provided with contact information for the PCP's office or ED? Yes he was provided with the clinic number.  Was the pt encouraged to call back with questions or concerns?yes

## 2019-03-18 ENCOUNTER — Other Ambulatory Visit: Payer: Self-pay

## 2019-03-18 ENCOUNTER — Ambulatory Visit: Payer: Self-pay | Attending: Nurse Practitioner | Admitting: Nurse Practitioner

## 2019-03-18 ENCOUNTER — Encounter: Payer: Self-pay | Admitting: Nurse Practitioner

## 2019-03-18 DIAGNOSIS — I10 Essential (primary) hypertension: Secondary | ICD-10-CM

## 2019-03-18 DIAGNOSIS — Z9981 Dependence on supplemental oxygen: Secondary | ICD-10-CM

## 2019-03-18 DIAGNOSIS — E785 Hyperlipidemia, unspecified: Secondary | ICD-10-CM

## 2019-03-18 DIAGNOSIS — I4891 Unspecified atrial fibrillation: Secondary | ICD-10-CM

## 2019-03-18 DIAGNOSIS — E1165 Type 2 diabetes mellitus with hyperglycemia: Secondary | ICD-10-CM

## 2019-03-18 DIAGNOSIS — J9611 Chronic respiratory failure with hypoxia: Secondary | ICD-10-CM

## 2019-03-18 DIAGNOSIS — E118 Type 2 diabetes mellitus with unspecified complications: Secondary | ICD-10-CM

## 2019-03-18 DIAGNOSIS — K219 Gastro-esophageal reflux disease without esophagitis: Secondary | ICD-10-CM

## 2019-03-18 DIAGNOSIS — IMO0002 Reserved for concepts with insufficient information to code with codable children: Secondary | ICD-10-CM

## 2019-03-18 DIAGNOSIS — I5032 Chronic diastolic (congestive) heart failure: Secondary | ICD-10-CM

## 2019-03-18 MED ORDER — RIVAROXABAN 20 MG PO TABS: 20.0000 mg | ORAL_TABLET | Freq: Every day | ORAL | 0 refills | Status: DC

## 2019-03-18 MED ORDER — AMLODIPINE BESYLATE 10 MG PO TABS: 10.0000 mg | ORAL_TABLET | Freq: Every morning | ORAL | 1 refills | Status: DC

## 2019-03-18 MED ORDER — LISINOPRIL 40 MG PO TABS: 40.0000 mg | ORAL_TABLET | Freq: Every morning | ORAL | 0 refills | Status: DC

## 2019-03-18 MED ORDER — SPIRONOLACTONE 50 MG PO TABS: 50.0000 mg | ORAL_TABLET | Freq: Every morning | ORAL | 2 refills | Status: DC

## 2019-03-18 MED ORDER — METFORMIN HCL 500 MG PO TABS: 500.0000 mg | ORAL_TABLET | Freq: Two times a day (BID) | ORAL | 0 refills | Status: DC

## 2019-03-18 MED ORDER — TRUE METRIX METER W/DEVICE KIT: PACK | 0 refills | Status: DC

## 2019-03-18 MED ORDER — OMEPRAZOLE 40 MG PO CPDR: 40.0000 mg | DELAYED_RELEASE_CAPSULE | Freq: Every morning | ORAL | 0 refills | Status: DC

## 2019-03-18 MED ORDER — ATORVASTATIN CALCIUM 20 MG PO TABS: 20.0000 mg | ORAL_TABLET | Freq: Every day | ORAL | 0 refills | Status: DC

## 2019-03-18 MED ORDER — LABETALOL HCL 200 MG PO TABS: 200.0000 mg | ORAL_TABLET | Freq: Every morning | ORAL | 0 refills | Status: DC

## 2019-03-18 MED ORDER — TRUE METRIX BLOOD GLUCOSE TEST VI STRP: ORAL_STRIP | 12 refills | Status: DC

## 2019-03-18 MED ORDER — FUROSEMIDE 80 MG PO TABS: 80.0000 mg | ORAL_TABLET | Freq: Three times a day (TID) | ORAL | 2 refills | Status: DC

## 2019-03-18 MED ORDER — TRUEPLUS LANCETS 28G MISC: 3 refills | Status: DC

## 2019-03-18 MED ORDER — FERROUS SULFATE 325 (65 FE) MG PO TABS: 325.0000 mg | ORAL_TABLET | Freq: Every day | ORAL | 0 refills | Status: DC

## 2019-03-18 MED FILL — ATORVASTATIN 20 MG TABLET: 20 | 30 days supply | Qty: 30 | Fill #0

## 2019-03-18 MED FILL — FERROUS SULFATE 325 MG TAB: 325 (65 FE) | 30 days supply | Qty: 30 | Fill #0

## 2019-03-18 MED FILL — TRUEplus LANCETS 28G MISC: 50 days supply | Qty: 100 | Fill #0

## 2019-03-18 MED FILL — !TRUE METRIX BLOOD GLUCOSE: 365 days supply | Qty: 1 | Fill #0

## 2019-03-18 MED FILL — XARELTO 20 MG TABLET: 20 | 30 days supply | Qty: 30 | Fill #0

## 2019-03-18 MED FILL — TRUE METRIX TEST STRIP: 25 days supply | Qty: 50 | Fill #0

## 2019-03-18 MED FILL — metFORMIN HCL 500 MG TABS: 500 | 30 days supply | Qty: 60 | Fill #0

## 2019-03-18 MED FILL — OMEPRAZOLE DR 40 MG CAPSULE: 40 | 30 days supply | Qty: 30 | Fill #0

## 2019-03-18 MED FILL — FUROSEMIDE 80 MG TAB: 80 | 30 days supply | Qty: 90 | Fill #0

## 2019-03-18 MED FILL — LISINOPRIL 40 MG TAB: 40 | 30 days supply | Qty: 30 | Fill #0

## 2019-03-18 MED FILL — LABETALOL HCL 200 MG TABLET: 200 | 30 days supply | Qty: 30 | Fill #0

## 2019-03-18 MED FILL — SPIRONOLACTONE 50 MG TABLET: 50 | 30 days supply | Qty: 30 | Fill #0

## 2019-03-18 MED FILL — AMLODIPINE BESYLATE 10 MG T: 10 | 30 days supply | Qty: 30 | Fill #0

## 2019-03-18 NOTE — Progress Notes (Signed)
Virtual Visit via Telephone Note Due to national recommendations of social distancing due to [place] 19, telehealth visit is felt to be most appropriate for this patient at this time.  I discussed the limitations, risks, security and privacy concerns of performing an evaluation and management service by telephone and the availability of in person appointments. I also discussed with the patient that there may be a patient responsible charge related to this service. The patient expressed understanding and agreed to proceed.    I connected with Benjamin [name] on 03/18/19  at  10:50 AM EDT  EDT by telephone and verified that I am speaking with the correct person using two identifiers.   Consent I discussed the limitations, risks, security and privacy concerns of performing an evaluation and management service by telephone and the availability of in person appointments. I also discussed with the patient that there may be a patient responsible charge related to this service. The patient expressed understanding and agreed to proceed.   Location of Patient: Private Room; Stays at a halfway house   Location of Provider: [place] participating in Telemedicine visit: [name] [place]    History of Present Illness: Telemedicine visit for: Benjamin [name]  Admitted to the hospital on 03-11-2019 and discharged the day after. I had sent him to the Emergency Room after a televisit due to extreme shortness of breath, wheezing and hypoxia with Sats in the 80s (patient has pulse oximeter). Unfortunately he had recently been discharged from a previous hospital admission on 03-01-2019 for rectal bleeding (external and internal hemorrhoids) without his O2 that was ordered.  Today he reports doing much better. Currently on 2L O2 [place]. Taking xarelto for Afib and PE as prescribed. Does endorses racing heart with exertion. Will need a Cardiology  consult with CHF. He also needs another sleep study. Sleeps in upright position mostly to help with dyspnea. States CPAP tubing is broken. Not sure if he is even on correct settings.   Will also benefit from Pulmonology consult due to morbid obesity, PE, Hypoxia.   He does not have a meter. Will send supplies today. Denies any hypo or hyperglycemic symptoms. Overdue for eye exam. Patient has been advised to apply for financial assistance and schedule to see our financial counselor.  Lab Results  Component Value Date   HGBA1C 7.6 (H) 01/23/2019    Past Medical History:  Diagnosis Date  . A-fib ([place])   . Diabetes mellitus without complication ([place])   . Elevated troponin 02/06/2019  . Hypertension   . Obesity   . Pulmonary embolism ([place])   . Sleep apnea     Past Surgical History:  Procedure Laterality Date  . COLONOSCOPY WITH PROPOFOL Left 02/25/2019   Procedure: COLONOSCOPY WITH PROPOFOL;  Surgeon: Arta Silence, MD;  Location: [facility];  Service: Endoscopy;  Laterality: Left;  . ESOPHAGOGASTRODUODENOSCOPY (EGD) WITH PROPOFOL Left 02/25/2019   Procedure: ESOPHAGOGASTRODUODENOSCOPY (EGD) WITH PROPOFOL;  Surgeon: Arta Silence, MD;  Location: [facility] ENDOSCOPY;  Service: Endoscopy;  Laterality: Left;  . NO PAST SURGERIES      Family History  Problem Relation Age of Onset  . Hypertension Mother   . Diabetes Mother     Social History   Socioeconomic History  . Marital status: Married    Spouse name: Not on file  . Number of children: Not on file  . Years of education: Not on file  . Highest education level:  Not on file  Occupational History  . Not on file  Social Needs  . Financial resource strain: Not on file  . Food insecurity    Worry: Not on file    Inability: Not on file  . Transportation needs    Medical: Not on file    Non-medical: Not on file  Tobacco Use  . Smoking status: Former Research scientist (life sciences)  . Smokeless tobacco: Never Used  Substance and Sexual Activity  . Alcohol  use: Not Currently  . Drug use: Not Currently  . Sexual activity: Not Currently  Lifestyle  . Physical activity    Days per week: Not on file    Minutes per session: Not on file  . Stress: Not on file  Relationships  . Social Herbalist on phone: Not on file    Gets together: Not on file    Attends religious service: Not on file    Active member of club or organization: Not on file    Attends meetings of clubs or organizations: Not on file    Relationship status: Not on file  Other Topics Concern  . Not on file  Social History Narrative  . Not on file     Observations/Objective: Awake, alert and oriented x 3   Review of Systems  Constitutional: Negative for fever, malaise/fatigue and weight loss.  HENT: Negative.  Negative for nosebleeds.   Eyes: Negative.  Negative for blurred vision, double vision and photophobia.  Respiratory: Positive for shortness of breath (improved; on chronic O2). Negative for cough.   Cardiovascular: Positive for palpitations, leg swelling (improved) and PND. Negative for chest pain.  Gastrointestinal: Positive for heartburn. Negative for nausea and vomiting.  Musculoskeletal: Negative.  Negative for myalgias.  Neurological: Negative.  Negative for dizziness, focal weakness, seizures and headaches.  Psychiatric/Behavioral: Negative.  Negative for suicidal ideas.    Assessment and Plan: Benjamin [name] was evaluated today for hospitalization follow-up.  Diagnoses and all orders for this visit:  Atrial fibrillation, unspecified type ([place]) -     rivaroxaban (XARELTO) 20 MG TABS tablet; Take 1 tablet (20 mg total) by mouth daily. Patient will pick up scripts today. -     Ambulatory referral to Cardiology CHA2DS2-VASc score =3 (HTN, DM, HF).   Diabetes mellitus type 2, uncontrolled, with complications (HCC) -     metFORMIN (GLUCOPHAGE) 500 MG tablet; Take 1 tablet (500 mg total) by mouth 2 (two) times a day. Patient will pick up scripts  today. -     Ambulatory referral to Ophthalmology -     Blood Glucose Monitoring Suppl (TRUE METRIX METER) w/Device KIT; Use as instructed. Check blood glucose level by fingerstick twice per day. -     glucose blood (TRUE METRIX BLOOD GLUCOSE TEST) test strip; Use as instructed. Check blood glucose level by fingerstick twice per day. -     TRUEplus Lancets 28G MISC; Use as instructed. Check blood glucose level by fingerstick twice per day. Continue blood sugar control as discussed in office today, low carbohydrate diet, and regular physical exercise as tolerated, 150 minutes per week (30 min each day, 5 days per week, or 50 min 3 days per week). Keep blood sugar logs with fasting goal of 90-130 mg/dl, post prandial (after you eat) less than 180.  For Hypoglycemia: BS <60 and Hyperglycemia BS >400; contact the clinic ASAP. Annual eye exams and foot exams are recommended. Lab Results  Component Value Date   HGBA1C 7.6 (H) 01/23/2019  Chronic respiratory failure with hypoxia, on home O2 therapy (HCC) Continue O2 2L as prescribed  Essential hypertension -     lisinopril (ZESTRIL) 40 MG tablet; Take 1 tablet (40 mg total) by mouth every morning. Patient will pick up scripts today. -     amLODipine (NORVASC) 10 MG tablet; Take 1 tablet (10 mg total) by mouth every morning. Patient will pick up scripts today. BP Readings from Last 3 Encounters:  03/12/19 (!) 149/85  03/01/19 (!) 154/93  02/14/19 127/82    Chronic diastolic CHF (congestive heart failure) (HCC) -     spironolactone (ALDACTONE) 50 MG tablet; Take 1 tablet (50 mg total) by mouth every morning. Patient will pick up scripts today. -     labetalol (NORMODYNE) 200 MG tablet; Take 1 tablet (200 mg total) by mouth every morning. Patient will pick up scripts today. -     furosemide (LASIX) 80 MG tablet; Take 1 tablet (80 mg total) by mouth 3 (three) times daily for 30 days. Patient will pick up scripts today.  Gastroesophageal reflux  disease, esophagitis presence not specified -     omeprazole (PRILOSEC) 40 MG capsule; Take 1 capsule (40 mg total) by mouth every morning. Patient will pick up scripts today. INSTRUCTIONS: Avoid GERD Triggers: acidic, spicy or fried foods, caffeine, coffee, sodas,  alcohol and chocolate.    Dyslipidemia -     atorvastatin (LIPITOR) 20 MG tablet; Take 1 tablet (20 mg total) by mouth at bedtime. Patient will pick up scripts today. INSTRUCTIONS: Work on a low fat, heart healthy diet and participate in regular aerobic exercise program by working out at least 150 minutes per week; 5 days a week-30 minutes per day. Avoid red meat, fried foods. junk foods, sodas, sugary drinks, unhealthy snacking, alcohol and smoking.  Drink at least 48oz of water per day and monitor your carbohydrate intake daily.   Other orders -     ferrous sulfate 325 (65 FE) MG tablet; Take 1 tablet (325 mg total) by mouth daily. Patient will pick up scripts today.     Follow Up Instructions Return in about 4 weeks (around 04/15/2019).     I discussed the assessment and treatment plan with the patient. The patient was provided an opportunity to ask questions and all were answered. The patient agreed with the plan and demonstrated an understanding of the instructions.   The patient was advised to call back or seek an in-person evaluation if the symptoms worsen or if the condition fails to improve as anticipated.  I provided 26 minutes of non-face-to-face time during this encounter including median intraservice time, reviewing previous notes, labs, imaging, medications and explaining diagnosis and management.  [name], FNP-BC

## 2019-03-20 ENCOUNTER — Ambulatory Visit: Payer: Self-pay | Admitting: Nurse Practitioner

## 2019-03-20 DIAGNOSIS — U071 COVID-19: Secondary | ICD-10-CM | POA: Diagnosis not present

## 2019-03-25 ENCOUNTER — Telehealth: Payer: Self-pay | Admitting: General Practice

## 2019-03-25 NOTE — Telephone Encounter (Signed)
Pt drop-up only the CAFA and OC application, The application was mail back since Pt does not have an appt schedule and the application was incomplete

## 2019-04-01 ENCOUNTER — Emergency Department (HOSPITAL_COMMUNITY): Payer: Medicaid Other

## 2019-04-01 ENCOUNTER — Other Ambulatory Visit: Payer: Self-pay

## 2019-04-01 ENCOUNTER — Emergency Department (HOSPITAL_COMMUNITY)
Admission: EM | Admit: 2019-04-01 | Discharge: 2019-04-01 | Disposition: A | Discharge status: Home / Self Care | Payer: Medicaid Other | Attending: Emergency Medicine | Admitting: Emergency Medicine

## 2019-04-01 ENCOUNTER — Encounter (HOSPITAL_COMMUNITY): Payer: Self-pay

## 2019-04-01 DIAGNOSIS — Z79899 Other long term (current) drug therapy: Secondary | ICD-10-CM | POA: Insufficient documentation

## 2019-04-01 DIAGNOSIS — I5032 Chronic diastolic (congestive) heart failure: Secondary | ICD-10-CM | POA: Diagnosis not present

## 2019-04-01 DIAGNOSIS — R609 Edema, unspecified: Secondary | ICD-10-CM | POA: Insufficient documentation

## 2019-04-01 DIAGNOSIS — I10 Essential (primary) hypertension: Secondary | ICD-10-CM | POA: Diagnosis not present

## 2019-04-01 DIAGNOSIS — E119 Type 2 diabetes mellitus without complications: Secondary | ICD-10-CM | POA: Diagnosis not present

## 2019-04-01 DIAGNOSIS — R600 Localized edema: Secondary | ICD-10-CM

## 2019-04-01 DIAGNOSIS — R069 Unspecified abnormalities of breathing: Secondary | ICD-10-CM | POA: Diagnosis not present

## 2019-04-01 DIAGNOSIS — N5089 Other specified disorders of the male genital organs: Secondary | ICD-10-CM

## 2019-04-01 DIAGNOSIS — I110 Hypertensive heart disease with heart failure: Secondary | ICD-10-CM | POA: Insufficient documentation

## 2019-04-01 DIAGNOSIS — R0602 Shortness of breath: Secondary | ICD-10-CM | POA: Insufficient documentation

## 2019-04-01 DIAGNOSIS — Z87891 Personal history of nicotine dependence: Secondary | ICD-10-CM | POA: Diagnosis not present

## 2019-04-01 DIAGNOSIS — Z7984 Long term (current) use of oral hypoglycemic drugs: Secondary | ICD-10-CM | POA: Insufficient documentation

## 2019-04-01 LAB — CBC
HCT: 33.2 % — ABNORMAL LOW (ref 39.0–52.0)
Hemoglobin: 10 g/dL — ABNORMAL LOW (ref 13.0–17.0)
MCH: 24.7 pg — ABNORMAL LOW (ref 26.0–34.0)
MCHC: 30.1 g/dL (ref 30.0–36.0)
MCV: 82 fL (ref 80.0–100.0)
Platelets: 392 10*3/uL (ref 150–400)
RBC: 4.05 MIL/uL — ABNORMAL LOW (ref 4.22–5.81)
RDW: 16.5 % — ABNORMAL HIGH (ref 11.5–15.5)
WBC: 7.5 10*3/uL (ref 4.0–10.5)
nRBC: 0 % (ref 0.0–0.2)

## 2019-04-01 LAB — BASIC METABOLIC PANEL
Anion gap: 9 (ref 5–15)
BUN: 9 mg/dL (ref 6–20)
CO2: 28 mmol/L (ref 22–32)
Calcium: 8.6 mg/dL — ABNORMAL LOW (ref 8.9–10.3)
Chloride: 103 mmol/L (ref 98–111)
Creatinine, Ser: 1.06 mg/dL (ref 0.61–1.24)
GFR calc Af Amer: >60 mL/min (ref >60)
GFR calc non Af Amer: >60 mL/min (ref >60)
Glucose, Bld: 146 mg/dL — ABNORMAL HIGH (ref 70–99)
Potassium: 3.8 mmol/L (ref 3.5–5.1)
Sodium: 140 mmol/L (ref 135–145)

## 2019-04-01 LAB — BRAIN NATRIURETIC PEPTIDE: B Natriuretic Peptide: 34.9 pg/mL (ref 0.0–100.0)

## 2019-04-01 MED ORDER — FUROSEMIDE 10 MG/ML IJ SOLN
80.0000 mg | Freq: Once | INTRAMUSCULAR | Status: AC
  Administered 2019-04-01: 80 mg via INTRAVENOUS
  Filled 2019-04-01: qty 8

## 2019-04-01 MED ORDER — SODIUM CHLORIDE 0.9% FLUSH: 3.0000 mL | Freq: Once | INTRAVENOUS | Status: DC

## 2019-04-01 NOTE — ED Triage Notes (Signed)
Came in via ems; c/o worsening shortness of breathe along w/ swelling in groin and leg area. Reported + hx of Covid 3 mos ago;

## 2019-04-01 NOTE — ED Notes (Signed)
Pt Oxygen while ambulating stayed between 96-97. Pulse stayed between 106-111. Pt was short of breath when coming back to room.

## 2019-04-01 NOTE — ED Notes (Signed)
Patient verbalizes understanding of discharge instructions. Opportunity for questioning and answers were provided. Armband removed by staff, pt discharged from ED.  

## 2019-04-01 NOTE — Discharge Instructions (Signed)
Call your cardiologist or your primary care provider tomorrow morning and tell them that you are having worsening swelling in your legs and your scrotum.  They may want you to increase the dose of your fluid pill.  The good news is that you do not have any fluid in your lungs that we can see and your oxygen levels are good as well.  Continue taking all of your other home medicines as prescribed.  Return to the emergency department if any concerning signs or symptoms develop such as worsening shortness of breath, chest pains, high fevers, loss of pulses, or passing out.

## 2019-04-01 NOTE — ED Provider Notes (Signed)
[facility] EMERGENCY DEPARTMENT Provider Note   CSN: [id number] Arrival date & time: 04/01/19  1802    History   Chief Complaint Chief Complaint  Patient presents with  . Shortness of Breath  . Leg Swelling    HPI Benjamin [name] is a 53 y.o. male with history of A. fib, diabetes mellitus, hypertension, obesity, PE presents for evaluation of acute onset, progressively worsening bilateral lower extremity edema and scrotal swelling for 2 days.  He reports swelling is worse on the left side.  Denies any groin pain.  He does report he has had decreased urine output compared to baseline though he has been compliant with his usual 80 mg of Lasix 3 times daily.  He denies chest pain, abdominal pain, nausea, or vomiting.  He does not think that he has gained weight but is unsure.  He is on 2 L nasal cannula supplemental oxygen at baseline and reports feeling short of breath with ambulation and exertion.  He has been checking his O2 saturations at home with ambulation and states they have been within normal limits on his usual supplemental oxygen requirement.  Also notes orthopnea and PND but states this is chronic due to his OSA.  Denies fever, cough, diarrhea, constipation, melena, hematochezia.  Has not tried anything for his symptoms.  He is currently anticoagulated on Xarelto has been compliant with his medications.  Also of note he has had recent admissions in the last 2 months for COVID-19 infection and GI bleed.     The history is provided by the patient.    Past Medical History:  Diagnosis Date  . A-fib ([place])   . Diabetes mellitus without complication ([place])   . Elevated troponin 02/06/2019  . Hypertension   . Obesity   . Pulmonary embolism ([place])   . Sleep apnea     Patient Active Problem List   Diagnosis Date Noted  . Iron deficiency anemia 03/11/2019  . OSA on CPAP 03/11/2019  . HLD (hyperlipidemia) 03/11/2019  . Elevated troponin 03/11/2019  . GERD  (gastroesophageal reflux disease) 03/11/2019  . Rectal bleeding 02/20/2019  . Dyspnea 02/06/2019  . COVID-19 virus infection 02/06/2019  . Bilateral lower extremity edema   . Obesity, Class III, BMI 40-49.9 (morbid obesity) ([place])   . Chronic diastolic CHF (congestive heart failure) ([place])   . PAF (paroxysmal atrial fibrillation) ([place])   . PE (pulmonary thromboembolism) ([place]) 01/21/2019  . Hypertensive urgency 01/21/2019  . Diabetes mellitus type 2 in obese ([place]) 01/21/2019    Past Surgical History:  Procedure Laterality Date  . COLONOSCOPY WITH PROPOFOL Left 02/25/2019   Procedure: COLONOSCOPY WITH PROPOFOL;  Surgeon: Arta Silence, MD;  Location: [facility];  Service: Endoscopy;  Laterality: Left;  . ESOPHAGOGASTRODUODENOSCOPY (EGD) WITH PROPOFOL Left 02/25/2019   Procedure: ESOPHAGOGASTRODUODENOSCOPY (EGD) WITH PROPOFOL;  Surgeon: Arta Silence, MD;  Location: [facility] ENDOSCOPY;  Service: Endoscopy;  Laterality: Left;  . NO PAST SURGERIES          Home Medications    Prior to Admission medications   Medication Sig Start Date End Date Taking? Authorizing Provider  amLODipine (NORVASC) 10 MG tablet Take 1 tablet (10 mg total) by mouth every morning. Patient will pick up scripts today. 03/18/19 06/16/19  [name], NP  atorvastatin (LIPITOR) 20 MG tablet Take 1 tablet (20 mg total) by mouth at bedtime. Patient will pick up scripts today. 03/18/19 06/16/19  [name], NP  Blood Glucose Monitoring Suppl (TRUE METRIX METER)  w/Device KIT Use as instructed. Check blood glucose level by fingerstick twice per day. 03/18/19   [name], NP  carboxymethylcellulose (REFRESH PLUS) 0.5 % SOLN Place 1 drop into both eyes daily.     [provider]  docusate sodium (COLACE) 100 MG capsule Take 1 capsule (100 mg total) by mouth 2 (two) times daily. 03/01/19   Monica Becton, MD  ferrous sulfate 325 (65 FE) MG tablet Take 1 tablet (325 mg total) by mouth daily. Patient will  pick up scripts today. 03/18/19 06/16/19  [name], NP  furosemide (LASIX) 80 MG tablet Take 1 tablet (80 mg total) by mouth 3 (three) times daily for 30 days. Patient will pick up scripts today. 03/18/19 04/17/19  [name], NP  glucose blood (TRUE METRIX BLOOD GLUCOSE TEST) test strip Use as instructed. Check blood glucose level by fingerstick twice per day. 03/18/19   [name], NP  labetalol (NORMODYNE) 200 MG tablet Take 1 tablet (200 mg total) by mouth every morning. Patient will pick up scripts today. 03/18/19 06/16/19  [name], NP  lisinopril (ZESTRIL) 40 MG tablet Take 1 tablet (40 mg total) by mouth every morning. Patient will pick up scripts today. 03/18/19 06/16/19  [name], NP  metFORMIN (GLUCOPHAGE) 500 MG tablet Take 1 tablet (500 mg total) by mouth 2 (two) times a day. Patient will pick up scripts today. 03/18/19 06/16/19  [name], NP  omeprazole (PRILOSEC) 40 MG capsule Take 1 capsule (40 mg total) by mouth every morning. Patient will pick up scripts today. 03/18/19 06/16/19  [name], NP  polyethylene glycol (MIRALAX / GLYCOLAX) 17 g packet Take 17 g by mouth daily as needed for severe constipation. 03/01/19   Monica Becton, MD  rivaroxaban (XARELTO) 20 MG TABS tablet Take 1 tablet (20 mg total) by mouth daily. Patient will pick up scripts today. 03/18/19 06/16/19  [name], NP  spironolactone (ALDACTONE) 50 MG tablet Take 1 tablet (50 mg total) by mouth every morning. Patient will pick up scripts today. 03/18/19 06/16/19  [name], NP  triamcinolone cream (KENALOG) 0.1 % Apply 1 application topically 3 (three) times daily as needed (rash/irritation).  02/01/19   [provider]  TRUEplus Lancets 28G MISC Use as instructed. Check blood glucose level by fingerstick twice per day. 03/18/19   [name], NP    Family History Family History  Problem Relation Age of Onset  . Hypertension Mother   . Diabetes Mother      Social History Social History   Tobacco Use  . Smoking status: Former Research scientist (life sciences)  . Smokeless tobacco: Never Used  Substance Use Topics  . Alcohol use: Not Currently  . Drug use: Not Currently     Allergies   Patient has no known allergies.   Review of Systems Review of Systems  Constitutional: Negative for chills and fever.  Respiratory: Positive for shortness of breath. Negative for cough.   Cardiovascular: Positive for leg swelling. Negative for chest pain.  Gastrointestinal: Negative for abdominal pain, diarrhea, nausea and vomiting.  Genitourinary: Positive for decreased urine volume, penile swelling and scrotal swelling. Negative for dysuria, frequency, penile pain, testicular pain and urgency.  All other systems reviewed and are negative.    Physical Exam Updated Vital Signs BP (!) 156/97   Pulse 85   Temp 98.4 F (36.9 C) (Oral)   Resp 20   Ht '5\' 11"'$  (1.803 m)   Wt Marland Kitchen)  190.5 kg   SpO2 94%   BMI 58.58 kg/m   Physical Exam Vitals signs and nursing note reviewed.  Constitutional:      General: He is not in acute distress.    Appearance: He is well-developed.  HENT:     Head: Normocephalic and atraumatic.  Eyes:     General:        Right eye: No discharge.        Left eye: No discharge.     Conjunctiva/sclera: Conjunctivae normal.  Neck:     Vascular: No JVD.     Trachea: No tracheal deviation.  Cardiovascular:     Rate and Rhythm: Normal rate and regular rhythm.     Comments: 3+ pitting edema of the bilateral lower extremities, calves measure symmetrically bilaterally. Pulmonary:     Comments: Examination limited due to body habitus.  Globally diminished breath sounds, speaking in mostly full sentences on 2 L nasal cannula SPO2 saturations 98% Abdominal:     General: There is no distension.     Palpations: Abdomen is soft.  Genitourinary:    Comments: Examination performed in the presence of a chaperone.  Patient has dependent scrotal and penile  edema on exam but no tenderness, erythema, or induration.  Musculoskeletal:     Right lower leg: He exhibits no tenderness. Edema present.     Left lower leg: He exhibits no tenderness. Edema present.  Skin:    General: Skin is warm and dry.     Findings: No erythema.  Neurological:     Mental Status: He is alert.  Psychiatric:        Behavior: Behavior normal.      ED Treatments / Results  Labs (all labs ordered are listed, but only abnormal results are displayed) Labs Reviewed  BASIC METABOLIC PANEL - Abnormal; Notable for the following components:      Result Value   Glucose, Bld 146 (*)    Calcium 8.6 (*)    All other components within normal limits  CBC - Abnormal; Notable for the following components:   RBC 4.05 (*)    Hemoglobin 10.0 (*)    HCT 33.2 (*)    MCH 24.7 (*)    RDW 16.5 (*)    All other components within normal limits  BRAIN NATRIURETIC PEPTIDE    EKG ED ECG REPORT   Date: 04/01/2019  Rate: 91  Rhythm: normal sinus rhythm  QRS Axis: normal  Intervals: normal  ST/T Wave abnormalities: normal  Conduction Disutrbances:none  Narrative Interpretation:   Old EKG Reviewed: unchanged  I have personally reviewed the EKG tracing and agree with the computerized printout as noted.   Radiology Dg Chest Portable 1 View  Result Date: 04/01/2019 CLINICAL DATA:  Acute onset shortness of breath and BILATERAL LOWER extremity edema. COVID-19 positive on 03/11/2019. Former smoker. Current history of hypertension, diabetes, and sleep apnea. EXAM: PORTABLE CHEST 1 VIEW COMPARISON:  03/11/2019 and earlier. FINDINGS: Cardiac silhouette upper normal in size to slightly enlarged, unchanged. Lungs clear. Bronchovascular markings normal. Pulmonary vascularity normal. No visible pleural effusions. No pneumothorax. Stable mild elevation of the RIGHT hemidiaphragm. IMPRESSION: No acute cardiopulmonary disease. Electronically Signed   By: Evangeline Dakin M.D.   On: 04/01/2019  20:27    Procedures Procedures (including critical care time)  Medications Ordered in ED Medications  furosemide (LASIX) injection 80 mg (80 mg Intravenous Given 04/01/19 1923)     Initial Impression / Assessment and Plan / ED Course  I  have reviewed the triage vital signs and the nursing notes.  Pertinent labs & imaging results that were available during my care of the patient were reviewed by me and considered in my medical decision making (see chart for details). Patient presenting for evaluation of peripheral edema and scrotal swelling.  Patient is afebrile, vital signs are stable on his usual supplemental oxygen.  He does not appear visibly short of breath at rest.  He is nontoxic in appearance.  Chest x-ray shows no acute cardiopulmonary abnormalities with no evidence of pleural effusion or pneumonia.  EKG shows no acute ischemic abnormalities and he has no complaint of chest pain.  Low suspicion of ACS/MI.  His BNP is within normal limits and has not been elevated previously.  Remainder blood work reviewed by me shows no leukocytosis, mild anemia with H&H actually improved compared to previous, no metabolic derangements, no renal insufficiency.  He was ambulated in the ED with stable SPO2 saturations.  Doubt PE as he has DVT studies and the studies and VQ scan recently which were all negative for acute DVT/PE.  Doubt dissection, esophageal rupture, cardiac tamponade, or pneumothorax.  No tenderness to palpation or induration to suggest Fournier's gangrene, epididymoorchitis, or secondary skin infection.  Suspect he has worsening CHF with peripheral edema and scrotal swelling.  He was given a dose of IV Lasix in the ED which he tolerated without difficulty and good urine output.  I think it is reasonable to discharge him home with instructions to call his PCP or cardiologist with recommendations to likely increase his Lasix for a few days and close follow-up.  Discussed strict ED return  precautions.  Patient verbalized understanding of and agreement with plan and patient stable for discharge home at this time.  Final Clinical Impressions(s) / ED Diagnoses   Final diagnoses:  Peripheral edema  Scrotal edema    ED Discharge Orders    None       Debroah Baller 04/02/19 1209    Drenda Freeze, MD 04/03/19 [phone]

## 2019-04-11 ENCOUNTER — Other Ambulatory Visit: Payer: Self-pay

## 2019-04-11 ENCOUNTER — Telehealth: Payer: Self-pay | Admitting: Nurse Practitioner

## 2019-04-11 ENCOUNTER — Inpatient Hospital Stay (HOSPITAL_COMMUNITY)
Admission: EM | Admit: 2019-04-11 | Discharge: 2019-04-19 | DRG: 286 | Disposition: A | Discharge status: Home Health | Payer: Medicaid Other | Attending: Internal Medicine | Admitting: Internal Medicine

## 2019-04-11 ENCOUNTER — Emergency Department (HOSPITAL_COMMUNITY): Payer: Medicaid Other

## 2019-04-11 DIAGNOSIS — Z87891 Personal history of nicotine dependence: Secondary | ICD-10-CM

## 2019-04-11 DIAGNOSIS — I130 Hypertensive heart and chronic kidney disease with heart failure and stage 1 through stage 4 chronic kidney disease, or unspecified chronic kidney disease: Principal | ICD-10-CM | POA: Diagnosis present

## 2019-04-11 DIAGNOSIS — R1909 Other intra-abdominal and pelvic swelling, mass and lump: Secondary | ICD-10-CM | POA: Diagnosis present

## 2019-04-11 DIAGNOSIS — R339 Retention of urine, unspecified: Secondary | ICD-10-CM | POA: Diagnosis not present

## 2019-04-11 DIAGNOSIS — I5033 Acute on chronic diastolic (congestive) heart failure: Secondary | ICD-10-CM | POA: Diagnosis present

## 2019-04-11 DIAGNOSIS — Z833 Family history of diabetes mellitus: Secondary | ICD-10-CM | POA: Diagnosis not present

## 2019-04-11 DIAGNOSIS — Z8619 Personal history of other infectious and parasitic diseases: Secondary | ICD-10-CM

## 2019-04-11 DIAGNOSIS — N433 Hydrocele, unspecified: Secondary | ICD-10-CM | POA: Diagnosis not present

## 2019-04-11 DIAGNOSIS — N182 Chronic kidney disease, stage 2 (mild): Secondary | ICD-10-CM | POA: Diagnosis present

## 2019-04-11 DIAGNOSIS — R0602 Shortness of breath: Secondary | ICD-10-CM | POA: Diagnosis not present

## 2019-04-11 DIAGNOSIS — Z20828 Contact with and (suspected) exposure to other viral communicable diseases: Secondary | ICD-10-CM | POA: Diagnosis not present

## 2019-04-11 DIAGNOSIS — Z86711 Personal history of pulmonary embolism: Secondary | ICD-10-CM

## 2019-04-11 DIAGNOSIS — Z7984 Long term (current) use of oral hypoglycemic drugs: Secondary | ICD-10-CM

## 2019-04-11 DIAGNOSIS — E1165 Type 2 diabetes mellitus with hyperglycemia: Secondary | ICD-10-CM | POA: Diagnosis present

## 2019-04-11 DIAGNOSIS — E876 Hypokalemia: Secondary | ICD-10-CM | POA: Diagnosis not present

## 2019-04-11 DIAGNOSIS — I5032 Chronic diastolic (congestive) heart failure: Secondary | ICD-10-CM | POA: Diagnosis present

## 2019-04-11 DIAGNOSIS — Z7901 Long term (current) use of anticoagulants: Secondary | ICD-10-CM | POA: Diagnosis not present

## 2019-04-11 DIAGNOSIS — U071 COVID-19: Secondary | ICD-10-CM | POA: Diagnosis present

## 2019-04-11 DIAGNOSIS — Z6841 Body Mass Index (BMI) 40.0 and over, adult: Secondary | ICD-10-CM

## 2019-04-11 DIAGNOSIS — Z86718 Personal history of other venous thrombosis and embolism: Secondary | ICD-10-CM | POA: Diagnosis not present

## 2019-04-11 DIAGNOSIS — R600 Localized edema: Secondary | ICD-10-CM | POA: Diagnosis present

## 2019-04-11 DIAGNOSIS — E785 Hyperlipidemia, unspecified: Secondary | ICD-10-CM | POA: Diagnosis present

## 2019-04-11 DIAGNOSIS — J9621 Acute and chronic respiratory failure with hypoxia: Secondary | ICD-10-CM | POA: Diagnosis present

## 2019-04-11 DIAGNOSIS — D509 Iron deficiency anemia, unspecified: Secondary | ICD-10-CM | POA: Diagnosis present

## 2019-04-11 DIAGNOSIS — K219 Gastro-esophageal reflux disease without esophagitis: Secondary | ICD-10-CM | POA: Diagnosis present

## 2019-04-11 DIAGNOSIS — Z8249 Family history of ischemic heart disease and other diseases of the circulatory system: Secondary | ICD-10-CM | POA: Diagnosis not present

## 2019-04-11 DIAGNOSIS — E669 Obesity, unspecified: Secondary | ICD-10-CM

## 2019-04-11 DIAGNOSIS — G4733 Obstructive sleep apnea (adult) (pediatric): Secondary | ICD-10-CM | POA: Diagnosis present

## 2019-04-11 DIAGNOSIS — I480 Paroxysmal atrial fibrillation: Secondary | ICD-10-CM | POA: Diagnosis present

## 2019-04-11 DIAGNOSIS — N179 Acute kidney failure, unspecified: Secondary | ICD-10-CM | POA: Diagnosis present

## 2019-04-11 DIAGNOSIS — E1169 Type 2 diabetes mellitus with other specified complication: Secondary | ICD-10-CM

## 2019-04-11 DIAGNOSIS — E1122 Type 2 diabetes mellitus with diabetic chronic kidney disease: Secondary | ICD-10-CM | POA: Diagnosis present

## 2019-04-11 DIAGNOSIS — N503 Cyst of epididymis: Secondary | ICD-10-CM | POA: Diagnosis not present

## 2019-04-11 DIAGNOSIS — R609 Edema, unspecified: Secondary | ICD-10-CM

## 2019-04-11 DIAGNOSIS — Z9989 Dependence on other enabling machines and devices: Secondary | ICD-10-CM

## 2019-04-11 DIAGNOSIS — I2699 Other pulmonary embolism without acute cor pulmonale: Secondary | ICD-10-CM

## 2019-04-11 LAB — CBC WITH DIFFERENTIAL/PLATELET
Abs Immature Granulocytes: 0.03 10*3/uL (ref 0.00–0.07)
Basophils Absolute: 0 10*3/uL (ref 0.0–0.1)
Basophils Relative: 0 %
Eosinophils Absolute: 0.6 10*3/uL — ABNORMAL HIGH (ref 0.0–0.5)
Eosinophils Relative: 7 %
HCT: 37.2 % — ABNORMAL LOW (ref 39.0–52.0)
Hemoglobin: 11 g/dL — ABNORMAL LOW (ref 13.0–17.0)
Immature Granulocytes: 0 %
Lymphocytes Relative: 28 %
Lymphs Abs: 2.3 10*3/uL (ref 0.7–4.0)
MCH: 24 pg — ABNORMAL LOW (ref 26.0–34.0)
MCHC: 29.6 g/dL — ABNORMAL LOW (ref 30.0–36.0)
MCV: 81.2 fL (ref 80.0–100.0)
Monocytes Absolute: 0.9 10*3/uL (ref 0.1–1.0)
Monocytes Relative: 10 %
Neutro Abs: 4.4 10*3/uL (ref 1.7–7.7)
Neutrophils Relative %: 55 %
Platelets: 400 10*3/uL (ref 150–400)
RBC: 4.58 MIL/uL (ref 4.22–5.81)
RDW: 16.3 % — ABNORMAL HIGH (ref 11.5–15.5)
WBC: 8.2 10*3/uL (ref 4.0–10.5)
nRBC: 0 % (ref 0.0–0.2)

## 2019-04-11 LAB — BRAIN NATRIURETIC PEPTIDE: B Natriuretic Peptide: 25.5 pg/mL (ref 0.0–100.0)

## 2019-04-11 LAB — BASIC METABOLIC PANEL
Anion gap: 7 (ref 5–15)
BUN: 10 mg/dL (ref 6–20)
CO2: 31 mmol/L (ref 22–32)
Calcium: 9.1 mg/dL (ref 8.9–10.3)
Chloride: 100 mmol/L (ref 98–111)
Creatinine, Ser: 1.05 mg/dL (ref 0.61–1.24)
GFR calc Af Amer: >60 mL/min (ref >60)
GFR calc non Af Amer: >60 mL/min (ref >60)
Glucose, Bld: 104 mg/dL — ABNORMAL HIGH (ref 70–99)
Potassium: 4 mmol/L (ref 3.5–5.1)
Sodium: 138 mmol/L (ref 135–145)

## 2019-04-11 LAB — TROPONIN I (HIGH SENSITIVITY)
Troponin I (High Sensitivity): 14 ng/L (ref <18)
Troponin I (High Sensitivity): 16 ng/L (ref <18)

## 2019-04-11 LAB — GLUCOSE, CAPILLARY: Glucose-Capillary: 118 mg/dL — ABNORMAL HIGH (ref 70–99)

## 2019-04-11 LAB — SARS CORONAVIRUS 2 BY RT PCR (HOSPITAL ORDER, PERFORMED IN ~~LOC~~ HOSPITAL LAB): SARS Coronavirus 2: NEGATIVE

## 2019-04-11 LAB — CBG MONITORING, ED: Glucose-Capillary: 142 mg/dL — ABNORMAL HIGH (ref 70–99)

## 2019-04-11 MED ORDER — INSULIN ASPART 100 UNIT/ML ~~LOC~~ SOLN
0.0000 [IU] | Freq: Three times a day (TID) | SUBCUTANEOUS | Status: DC
  Administered 2019-04-12 (×3): 2 [IU] via SUBCUTANEOUS
  Administered 2019-04-13: 3 [IU] via SUBCUTANEOUS
  Administered 2019-04-14 – 2019-04-17 (×5): 2 [IU] via SUBCUTANEOUS
  Administered 2019-04-18: 07:00:00 3 [IU] via SUBCUTANEOUS

## 2019-04-11 MED ORDER — SODIUM CHLORIDE 0.9 % IV SOLN: 250.0000 mL | INTRAVENOUS | Status: DC | PRN

## 2019-04-11 MED ORDER — LABETALOL HCL 200 MG PO TABS
200.0000 mg | ORAL_TABLET | Freq: Every morning | ORAL | Status: DC
  Administered 2019-04-12 – 2019-04-16 (×5): 200 mg via ORAL
  Filled 2019-04-11 (×5): qty 1

## 2019-04-11 MED ORDER — ATORVASTATIN CALCIUM 10 MG PO TABS
20.0000 mg | ORAL_TABLET | Freq: Every day | ORAL | Status: DC
  Administered 2019-04-12 – 2019-04-18 (×7): 20 mg via ORAL
  Filled 2019-04-11 (×7): qty 2

## 2019-04-11 MED ORDER — DOCUSATE SODIUM 100 MG PO CAPS
100.0000 mg | ORAL_CAPSULE | Freq: Two times a day (BID) | ORAL | Status: DC
  Administered 2019-04-12 – 2019-04-19 (×9): 100 mg via ORAL
  Filled 2019-04-11 (×15): qty 1

## 2019-04-11 MED ORDER — ACETAMINOPHEN 325 MG PO TABS: 650.0000 mg | ORAL_TABLET | ORAL | Status: DC | PRN

## 2019-04-11 MED ORDER — FUROSEMIDE 10 MG/ML IJ SOLN
80.0000 mg | Freq: Once | INTRAMUSCULAR | Status: AC
  Administered 2019-04-11: 80 mg via INTRAVENOUS
  Filled 2019-04-11: qty 8

## 2019-04-11 MED ORDER — ONDANSETRON HCL 4 MG/2ML IJ SOLN: 4.0000 mg | Freq: Four times a day (QID) | INTRAMUSCULAR | Status: DC | PRN

## 2019-04-11 MED ORDER — SPIRONOLACTONE 25 MG PO TABS
50.0000 mg | ORAL_TABLET | Freq: Every morning | ORAL | Status: DC
  Administered 2019-04-12 – 2019-04-15 (×4): 50 mg via ORAL
  Filled 2019-04-11 (×4): qty 2

## 2019-04-11 MED ORDER — FUROSEMIDE 10 MG/ML IJ SOLN
10.0000 mg/h | INTRAVENOUS | Status: DC
  Administered 2019-04-11 – 2019-04-14 (×3): 10 mg/h via INTRAVENOUS
  Filled 2019-04-11: qty 21
  Filled 2019-04-11: qty 25
  Filled 2019-04-11: qty 21
  Filled 2019-04-11 (×2): qty 25

## 2019-04-11 MED ORDER — POLYVINYL ALCOHOL 1.4 % OP SOLN
1.0000 [drp] | Freq: Every day | OPHTHALMIC | Status: DC
  Filled 2019-04-11: qty 15

## 2019-04-11 MED ORDER — PANTOPRAZOLE SODIUM 40 MG PO TBEC
40.0000 mg | DELAYED_RELEASE_TABLET | Freq: Every day | ORAL | Status: DC
  Administered 2019-04-12 – 2019-04-19 (×8): 40 mg via ORAL
  Filled 2019-04-11 (×8): qty 1

## 2019-04-11 MED ORDER — FERROUS SULFATE 325 (65 FE) MG PO TABS
325.0000 mg | ORAL_TABLET | Freq: Every day | ORAL | Status: DC
  Administered 2019-04-12 – 2019-04-19 (×8): 325 mg via ORAL
  Filled 2019-04-11 (×8): qty 1

## 2019-04-11 MED ORDER — SODIUM CHLORIDE 0.9% FLUSH
3.0000 mL | Freq: Two times a day (BID) | INTRAVENOUS | Status: DC
  Administered 2019-04-11 – 2019-04-19 (×8): 3 mL via INTRAVENOUS

## 2019-04-11 MED ORDER — SODIUM CHLORIDE 0.9% FLUSH: 3.0000 mL | INTRAVENOUS | Status: DC | PRN

## 2019-04-11 MED ORDER — POTASSIUM CHLORIDE CRYS ER 20 MEQ PO TBCR
60.0000 meq | EXTENDED_RELEASE_TABLET | Freq: Once | ORAL | Status: AC
  Administered 2019-04-11: 60 meq via ORAL
  Filled 2019-04-11: qty 3

## 2019-04-11 MED ORDER — POLYETHYLENE GLYCOL 3350 17 G PO PACK: 17.0000 g | PACK | Freq: Every day | ORAL | Status: DC | PRN

## 2019-04-11 MED ORDER — RIVAROXABAN 20 MG PO TABS
20.0000 mg | ORAL_TABLET | Freq: Every day | ORAL | Status: DC
  Administered 2019-04-12 – 2019-04-16 (×5): 20 mg via ORAL
  Filled 2019-04-11 (×5): qty 1

## 2019-04-11 MED ORDER — LISINOPRIL 40 MG PO TABS
40.0000 mg | ORAL_TABLET | Freq: Every morning | ORAL | Status: DC
  Administered 2019-04-12 – 2019-04-15 (×4): 40 mg via ORAL
  Filled 2019-04-11 (×4): qty 1

## 2019-04-11 MED ORDER — MORPHINE SULFATE (PF) 4 MG/ML IV SOLN
8.0000 mg | Freq: Once | INTRAVENOUS | Status: AC
  Administered 2019-04-11: 4 mg via INTRAVENOUS
  Filled 2019-04-11: qty 2

## 2019-04-11 MED ORDER — INSULIN ASPART 100 UNIT/ML ~~LOC~~ SOLN: 0.0000 [IU] | Freq: Every day | SUBCUTANEOUS | Status: DC

## 2019-04-11 NOTE — Telephone Encounter (Signed)
Patient called stating he is having a lot of water fluid around his groin area and leg and has taken his water pill. Please follow up

## 2019-04-11 NOTE — Plan of Care (Signed)
  Problem: Education: Goal: Knowledge of General Education information will improve Description: Including pain rating scale, medication(s)/side effects and non-pharmacologic comfort measures Outcome: Progressing   Problem: Health Behavior/Discharge Planning: Goal: Ability to manage health-related needs will improve Outcome: Progressing   Problem: Clinical Measurements: Goal: Ability to maintain clinical measurements within normal limits will improve Outcome: Progressing Goal: Will remain free from infection Outcome: Progressing Goal: Diagnostic test results will improve Outcome: Progressing Goal: Respiratory complications will improve Outcome: Not Met (add Reason) Goal: Cardiovascular complication will be avoided Outcome: Progressing   Problem: Activity: Goal: Risk for activity intolerance will decrease Outcome: Not Met (add Reason)   Problem: Nutrition: Goal: Adequate nutrition will be maintained Outcome: Progressing   Problem: Coping: Goal: Level of anxiety will decrease Outcome: Progressing   Problem: Elimination: Goal: Will not experience complications related to bowel motility Outcome: Progressing Goal: Will not experience complications related to urinary retention Outcome: Progressing   Problem: Pain Managment: Goal: General experience of comfort will improve Outcome: Progressing   Problem: Safety: Goal: Ability to remain free from injury will improve Outcome: Progressing   Problem: Skin Integrity: Goal: Risk for impaired skin integrity will decrease Outcome: Progressing   Problem: Education: Goal: Ability to demonstrate management of disease process will improve Outcome: Progressing Goal: Ability to verbalize understanding of medication therapies will improve Outcome: Progressing Goal: Individualized Educational Video(s) Outcome: Progressing   Problem: Activity: Goal: Capacity to carry out activities will improve Outcome: Progressing   Problem:  Cardiac: Goal: Ability to achieve and maintain adequate cardiopulmonary perfusion will improve Outcome: Progressing

## 2019-04-11 NOTE — H&P (Signed)
TRH H&P   Patient Demographics:    Benjamin [name], is a 53 y.o. male  MRN: [id number]   DOB - Dec 16, 1965  Admit Date - 04/11/2019  Outpatient Primary MD for the patient is [name], NP  Referring MD/NP/PA: dr Wilson Singer  Patient coming from: lives at half [address] [facility]  Chief Complaint  Patient presents with  . Leg Swelling  . Groin Swelling      HPI:    Benjamin [name]  is a 53 y.o. male, with medical history significant of dCHF, hypertension, hyperlipidemia, diabetes mellitus, GERD, atrial fibrillation and PE on Xarelto, OSA on CPAP, iron deficiency anemia, morbid obesity, chronic elevation of troponin , as well recent COVID-19 infection. Patient presents secondary to worsening lower extremity edema and scrotal edema, she had recent ED visit for the same, patient report he has been compliant with his medications, including Lasix 80 mg 3 times a day, reports his legs feel very heavy, and uncomfortable, as well as scrotum and significantly swollen as well limiting his movement, upon further questioning, patient report he is compliant with fluid restriction, salt restriction, reports mildly worsening dyspnea, mainly upon laying supine, denies any chest pain, cough, fever, chills, nausea, vomiting, hemoptysis, diarrhea, or blood per rectum. - in ED no significant labs abnormalities, oxygen requirement at baseline, but patient was noted to have significantly swollen scrotum and +2 lower extremity edema, received 80 mg IV Lasix as I was called to admit for volume overload.    Review of systems:    In addition to the HPI above,  No Fever-chills, does report weight gain and worsening lower extremity edema No Headache, No changes with Vision or hearing, No problems swallowing food or Liquids, No Chest pain, Cough he does report dyspnea No Abdominal pain, No Nausea or Vommitting, Bowel  movements are regular, No Blood in stool or Urine, No dysuria, No new skin rashes or bruises, No new joints pains-aches,  No new weakness, tingling, numbness in any extremity, No recent weight gain or loss, No polyuria, polydypsia or polyphagia, No significant Mental Stressors.  A full 10 point Review of Systems was done, except as stated above, all other Review of Systems were negative.   With Past History of the following :    Past Medical History:  Diagnosis Date  . A-fib ([place])   . Diabetes mellitus without complication ([place])   . Elevated troponin 02/06/2019  . Hypertension   . Obesity   . Pulmonary embolism ([place])   . Sleep apnea       Past Surgical History:  Procedure Laterality Date  . COLONOSCOPY WITH PROPOFOL Left 02/25/2019   Procedure: COLONOSCOPY WITH PROPOFOL;  Surgeon: Arta Silence, MD;  Location: [address];  Service: Endoscopy;  Laterality: Left;  . ESOPHAGOGASTRODUODENOSCOPY (EGD) WITH PROPOFOL Left 02/25/2019   Procedure: ESOPHAGOGASTRODUODENOSCOPY (EGD) WITH PROPOFOL;  Surgeon: Arta Silence, MD;  Location: [place];  Service: Endoscopy;  Laterality: Left;  . NO PAST SURGERIES        Social History:     Social History   Tobacco Use  . Smoking status: Former Research scientist (life sciences)  . Smokeless tobacco: Never Used  Substance Use Topics  . Alcohol use: Not Currently     Lives - home  Mobility - independent    Family History :     Family History  Problem Relation Age of Onset  . Hypertension Mother   . Diabetes Mother      Home Medications:   Prior to Admission medications   Medication Sig Start Date End Date Taking? Authorizing Provider  amLODipine (NORVASC) 10 MG tablet Take 1 tablet (10 mg total) by mouth every morning. Patient will pick up scripts today. 03/18/19 06/16/19  [name], NP  atorvastatin (LIPITOR) 20 MG tablet Take 1 tablet (20 mg total) by mouth at bedtime. Patient will pick up scripts today. 03/18/19 06/16/19  [name], NP  Blood Glucose Monitoring Suppl (TRUE METRIX METER) w/Device KIT Use as instructed. Check blood glucose level by fingerstick twice per day. 03/18/19   [name], NP  carboxymethylcellulose (REFRESH PLUS) 0.5 % SOLN Place 1 drop into both eyes daily.     [provider]  docusate sodium (COLACE) 100 MG capsule Take 1 capsule (100 mg total) by mouth 2 (two) times daily. 03/01/19   Monica Becton, MD  ferrous sulfate 325 (65 FE) MG tablet Take 1 tablet (325 mg total) by mouth daily. Patient will pick up scripts today. 03/18/19 06/16/19  [name], NP  furosemide (LASIX) 80 MG tablet Take 1 tablet (80 mg total) by mouth 3 (three) times daily for 30 days. Patient will pick up scripts today. 03/18/19 04/17/19  [name], NP  glucose blood (TRUE METRIX BLOOD GLUCOSE TEST) test strip Use as instructed. Check blood glucose level by fingerstick twice per day. 03/18/19   [name], NP  labetalol (NORMODYNE) 200 MG tablet Take 1 tablet (200 mg total) by mouth every morning. Patient will pick up scripts today. 03/18/19 06/16/19  [name], NP  lisinopril (ZESTRIL) 40 MG tablet Take 1 tablet (40 mg total) by mouth every morning. Patient will pick up scripts today. 03/18/19 06/16/19  [name], NP  metFORMIN (GLUCOPHAGE) 500 MG tablet Take 1 tablet (500 mg total) by mouth 2 (two) times a day. Patient will pick up scripts today. 03/18/19 06/16/19  [name], NP  omeprazole (PRILOSEC) 40 MG capsule Take 1 capsule (40 mg total) by mouth every morning. Patient will pick up scripts today. 03/18/19 06/16/19  [name], NP  polyethylene glycol (MIRALAX / GLYCOLAX) 17 g packet Take 17 g by mouth daily as needed for severe constipation. 03/01/19   Monica Becton, MD  rivaroxaban (XARELTO) 20 MG TABS tablet Take 1 tablet (20 mg total) by mouth daily. Patient will pick up scripts today. 03/18/19 06/16/19  [name], NP  spironolactone (ALDACTONE) 50 MG tablet  Take 1 tablet (50 mg total) by mouth every morning. Patient will pick up scripts today. 03/18/19 06/16/19  [name], NP  triamcinolone cream (KENALOG) 0.1 % Apply 1 application topically 3 (three) times daily as needed (rash/irritation).  02/01/19   [provider]  TRUEplus Lancets 28G MISC Use as instructed. Check blood glucose level by fingerstick twice per day. 03/18/19   [name], NP     Allergies:  No Known Allergies   Physical Exam:   Vitals  Blood pressure (!) 148/71, pulse 88, resp. rate 20, height _0  (1.803 m), weight (!) 178.8 kg, SpO2 99 %.   1. General morbidly obese male   lying in bed in NAD,   2. Normal affect and insight, Not Suicidal or Homicidal, Awake Alert, Oriented X 3.  3. No F.N deficits, ALL C.Nerves Intact, Strength 5/5 all 4 extremities, Sensation intact all 4 extremities, Plantars down going.  4. Ears and Eyes appear Normal, Conjunctivae clear, PERRLA. Moist Oral Mucosa.  5. Supple Neck, No JVD, No cervical lymphadenopathy appriciated, No Carotid Bruits.  6. Symmetrical Chest wall movement, diminished  air movement bilaterally, minimal crakles at bases.  7. RRR, No Gallops, Rubs or Murmurs, No Parasternal Heave.+2 edema, very significant scrotal edema  8. Positive Bowel Sounds, Abdomen Soft, No tenderness, No organomegaly appriciated,No rebound -guarding or rigidity.  9.  No Cyanosis, Normal Skin Turgor, No Skin Rash or Bruise.  10. Good muscle tone,  joints appear normal , no effusions, Normal ROM.  11. No Palpable Lymph Nodes in Neck or Axillae    Data Review:    CBC Recent Labs  Lab 04/11/19 1338  WBC 8.2  HGB 11.0*  HCT 37.2*  PLT 400  MCV 81.2  MCH 24.0*  MCHC 29.6*  RDW 16.3*  LYMPHSABS 2.3  MONOABS 0.9  EOSABS 0.6*  BASOSABS 0.0   ------------------------------------------------------------------------------------------------------------------  Chemistries  Recent Labs  Lab 04/11/19 1338  NA  138  K 4.0  CL 100  CO2 31  GLUCOSE 104*  BUN 10  CREATININE 1.05  CALCIUM 9.1   ------------------------------------------------------------------------------------------------------------------ estimated creatinine clearance is 134.3 mL/min (by C-G formula based on SCr of 1.05 mg/dL). ------------------------------------------------------------------------------------------------------------------ No results for input(s): TSH, T4TOTAL, T3FREE, THYROIDAB in the last 72 hours.  Invalid input(s): FREET3  Coagulation profile No results for input(s): INR, PROTIME in the last 168 hours. ------------------------------------------------------------------------------------------------------------------- No results for input(s): DDIMER in the last 72 hours. -------------------------------------------------------------------------------------------------------------------  Cardiac Enzymes No results for input(s): CKMB, TROPONINI, MYOGLOBIN in the last 168 hours.  Invalid input(s): CK ------------------------------------------------------------------------------------------------------------------    Component Value Date/Time   BNP 25.5 04/11/2019 1610     ---------------------------------------------------------------------------------------------------------------  Urinalysis    Component Value Date/Time   COLORURINE YELLOW 02/07/2019 1042   APPEARANCEUR HAZY (A) 02/07/2019 1042   LABSPEC 1.043 (H) 02/07/2019 1042   PHURINE 5.0 02/07/2019 1042   GLUCOSEU NEGATIVE 02/07/2019 1042   HGBUR NEGATIVE 02/07/2019 1042   BILIRUBINUR NEGATIVE 02/07/2019 1042   [place] 02/07/2019 1042   PROTEINUR 30 (A) 02/07/2019 1042   NITRITE NEGATIVE 02/07/2019 1042   LEUKOCYTESUR NEGATIVE 02/07/2019 1042    ----------------------------------------------------------------------------------------------------------------   Imaging Results:    Dg Chest Portable 1 View  Result Date:  04/11/2019 CLINICAL DATA:  Fluid retention and shortness of breath. Recent positive COVID-19 test on 03/11/2019. EXAM: PORTABLE CHEST 1 VIEW COMPARISON:  04/01/2019 FINDINGS: Cardiac silhouette is mildly enlarged. No mediastinal or hilar masses. Lungs demonstrate prominent bronchovascular markings similar to prior exams. No lung consolidation. No pleural effusion or pneumothorax. Skeletal structures are grossly intact. IMPRESSION: No acute cardiopulmonary disease. Electronically Signed   By: Lajean Manes M.D.   On: 04/11/2019 14:54    My personal review of EKG: Rhythm NSR, Rate  91 /min, QTc 409 , no Acute ST changes   Assessment & Plan:    Active Problems:   PE (pulmonary thromboembolism) ([place])   Diabetes mellitus type 2 in obese ([place])  Bilateral lower extremity edema   Obesity, Class III, BMI 40-49.9 (morbid obesity) ([facility])   Chronic diastolic CHF (congestive heart failure) ([facility])   PAF (paroxysmal atrial fibrillation) ([facility])   COVID-19 virus infection   OSA on CPAP   HLD (hyperlipidemia)   GERD (gastroesophageal reflux disease)   Acute on chronic diastolic CHF (congestive heart failure) ([facility])    Acute on chronic diastolic CHF -Due to significant lower extremity edema +2, and massively swollen scrotum, port compliant with medication, but not compliant with salt restrictions and fluid restriction. -He is already on 80 mg oral Lasix 3 times daily at home, I will start him on Lasix drip, strict ins and outs, daily weight, low-salt diet, I would anticipate he will need PRN metolazone to optimize urine output.  History of COVID-19, COVID-19 test pending at time of admission. -Received COVID-19 test 6/22, then was positive, patient actually infection was in early May, apparently patient has recovered, and is positive COVID-19 test, do not indicate illness, mainly viral sloughing from infection and may. -With 19 test pending at time of admission, it will be obtained to determine admission  disposition [facility] versus [facility].  Pulmonary embolism -Continue with Xarelto  Hypertension - continue with home meds  Beatties mellitus type II -Hold metformin and continue with insulin sliding scale during hospital stay  Paroxysmal A. Fib -CHA2DS2-Vasc 3, on Xarelto  Iron deficiency anemia - Hgb stable  OSA -Continue CPAP  Chronic hypoxic respiratory failure -At baseline, on 2 L nasal cannula  DVT Prophylaxis Xarelto    AM Labs Ordered, also please review Full Orders  Family Communication: Admission, patients condition and plan of care including tests being ordered have been discussed with the patient  who indicate understanding and agree with the plan and Code Status.  Code Status full  Likely DC to halfway home  Condition GUARDED    Consults called: None  Admission status: inpatient as dissipated will need IV diuresis for couple days at least  Time spent in minutes : 60 minutes   Phillips Climes M.D on 04/11/2019 at 5:40 PM  Between 7am to 7pm - Pager - [phone]. After 7pm go to [email or web address] - password [facility]  [facility] - Office  [phone]

## 2019-04-11 NOTE — Telephone Encounter (Signed)
Patients call returned.  Patient identified by name and date of birth.  Patient is complaining of increased swelling in his scrotum.  Patient states it has been present for 24 hours.  Patient has taken 160 mg's of "water pills."  Patient's case manager put on the phone and advised that he seek emergent evaluation immediately.  Patient and case worked acknowledged advise.

## 2019-04-11 NOTE — ED Provider Notes (Signed)
[facility] EMERGENCY DEPARTMENT Provider Note   CSN: [id number] Arrival date & time: 04/11/19  1309     History   Chief Complaint Chief Complaint  Patient presents with  . Leg Swelling  . Groin Swelling    HPI Benjamin [name] is a 53 y.o. male.     HPI   53 year old male with lower extremity edema and scrotal swelling.  Patient has been seen recently for the same.  He was last seen in the ER on 04/01/2019.  He reports compliance with all his medications.  He feels like his swelling is not improved despite this.  His legs are very heavy and uncomfortable and the scrotum is as well.  He is unsure about his weight.  He says he does not weigh himself regularly.  He has baseline dyspnea.  He feels like this is relatively stable.  Denies any acute chest pain.  No fevers, chills or coughing.  Past Medical History:  Diagnosis Date  . A-fib ([place])   . Diabetes mellitus without complication ([place])   . Elevated troponin 02/06/2019  . Hypertension   . Obesity   . Pulmonary embolism ([place])   . Sleep apnea     Patient Active Problem List   Diagnosis Date Noted  . Iron deficiency anemia 03/11/2019  . OSA on CPAP 03/11/2019  . HLD (hyperlipidemia) 03/11/2019  . Elevated troponin 03/11/2019  . GERD (gastroesophageal reflux disease) 03/11/2019  . Rectal bleeding 02/20/2019  . Dyspnea 02/06/2019  . COVID-19 virus infection 02/06/2019  . Bilateral lower extremity edema   . Obesity, Class III, BMI 40-49.9 (morbid obesity) ([place])   . Chronic diastolic CHF (congestive heart failure) ([place])   . PAF (paroxysmal atrial fibrillation) ([place])   . PE (pulmonary thromboembolism) ([place]) 01/21/2019  . Hypertensive urgency 01/21/2019  . Diabetes mellitus type 2 in obese ([place]) 01/21/2019    Past Surgical History:  Procedure Laterality Date  . COLONOSCOPY WITH PROPOFOL Left 02/25/2019   Procedure: COLONOSCOPY WITH PROPOFOL;  Surgeon: Arta Silence, MD;  Location: [facility];   Service: Endoscopy;  Laterality: Left;  . ESOPHAGOGASTRODUODENOSCOPY (EGD) WITH PROPOFOL Left 02/25/2019   Procedure: ESOPHAGOGASTRODUODENOSCOPY (EGD) WITH PROPOFOL;  Surgeon: Arta Silence, MD;  Location[facility] ENDOSCOPY;  Service: Endoscopy;  Laterality: Left;  . NO PAST SURGERIES          Home Medications    Prior to Admission medications   Medication Sig Start Date End Date Taking? Authorizing Provider  amLODipine (NORVASC) 10 MG tablet Take 1 tablet (10 mg total) by mouth every morning. Patient will pick up scripts today. 03/18/19 06/16/19  [name], NP  atorvastatin (LIPITOR) 20 MG tablet Take 1 tablet (20 mg total) by mouth at bedtime. Patient will pick up scripts today. 03/18/19 06/16/19  [name], NP  Blood Glucose Monitoring Suppl (TRUE METRIX METER) w/Device KIT Use as instructed. Check blood glucose level by fingerstick twice per day. 03/18/19   [name], NP  carboxymethylcellulose (REFRESH PLUS) 0.5 % SOLN Place 1 drop into both eyes daily.     [provider]  docusate sodium (COLACE) 100 MG capsule Take 1 capsule (100 mg total) by mouth 2 (two) times daily. 03/01/19   Monica Becton, MD  ferrous sulfate 325 (65 FE) MG tablet Take 1 tablet (325 mg total) by mouth daily. Patient will pick up scripts today. 03/18/19 06/16/19  [name], NP  furosemide (LASIX) 80 MG tablet Take 1 tablet (80 mg total) by  mouth 3 (three) times daily for 30 days. Patient will pick up scripts today. 03/18/19 04/17/19  [name], NP  glucose blood (TRUE METRIX BLOOD GLUCOSE TEST) test strip Use as instructed. Check blood glucose level by fingerstick twice per day. 03/18/19   [name], NP  labetalol (NORMODYNE) 200 MG tablet Take 1 tablet (200 mg total) by mouth every morning. Patient will pick up scripts today. 03/18/19 06/16/19  [name], NP  lisinopril (ZESTRIL) 40 MG tablet Take 1 tablet (40 mg total) by mouth every morning. Patient will pick up  scripts today. 03/18/19 06/16/19  [name], NP  metFORMIN (GLUCOPHAGE) 500 MG tablet Take 1 tablet (500 mg total) by mouth 2 (two) times a day. Patient will pick up scripts today. 03/18/19 06/16/19  [name], NP  omeprazole (PRILOSEC) 40 MG capsule Take 1 capsule (40 mg total) by mouth every morning. Patient will pick up scripts today. 03/18/19 06/16/19  [name], NP  polyethylene glycol (MIRALAX / GLYCOLAX) 17 g packet Take 17 g by mouth daily as needed for severe constipation. 03/01/19   Monica Becton, MD  rivaroxaban (XARELTO) 20 MG TABS tablet Take 1 tablet (20 mg total) by mouth daily. Patient will pick up scripts today. 03/18/19 06/16/19  [name], NP  spironolactone (ALDACTONE) 50 MG tablet Take 1 tablet (50 mg total) by mouth every morning. Patient will pick up scripts today. 03/18/19 06/16/19  [name], NP  triamcinolone cream (KENALOG) 0.1 % Apply 1 application topically 3 (three) times daily as needed (rash/irritation).  02/01/19   [provider]  TRUEplus Lancets 28G MISC Use as instructed. Check blood glucose level by fingerstick twice per day. 03/18/19   [name], NP    Family History Family History  Problem Relation Age of Onset  . Hypertension Mother   . Diabetes Mother     Social History Social History   Tobacco Use  . Smoking status: Former Research scientist (life sciences)  . Smokeless tobacco: Never Used  Substance Use Topics  . Alcohol use: Not Currently  . Drug use: Not Currently     Allergies   Patient has no known allergies.   Review of Systems Review of Systems  All systems reviewed and negative, other than as noted in HPI.  Physical Exam Updated Vital Signs BP (!) 141/123   Pulse 95   Resp 20   Ht 5' 11" (1.803 m)   Wt (!) 178.8 kg   SpO2 98%   BMI 54.97 kg/m   Physical Exam Vitals signs and nursing note reviewed.  Constitutional:      General: He is not in acute distress.    Appearance: He is well-developed.   HENT:     Head: Normocephalic and atraumatic.  Eyes:     General:        Right eye: No discharge.        Left eye: No discharge.     Conjunctiva/sclera: Conjunctivae normal.  Neck:     Musculoskeletal: Neck supple.  Cardiovascular:     Rate and Rhythm: Normal rate and regular rhythm.     Heart sounds: Normal heart sounds. No murmur. No friction rub. No gallop.   Pulmonary:     Effort: Pulmonary effort is normal. No respiratory distress.     Breath sounds: Normal breath sounds.  Abdominal:     General: There is no distension.     Palpations: Abdomen is soft.     Tenderness:  There is no abdominal tenderness.  Genitourinary:    Comments: Severe symmetric LE edema. Significant scrotal swelling. No overt signs of infection.  Musculoskeletal:     Right lower leg: Edema present.     Left lower leg: Edema present.  Skin:    General: Skin is warm and dry.  Neurological:     Mental Status: He is alert.  Psychiatric:        Behavior: Behavior normal.        Thought Content: Thought content normal.      ED Treatments / Results  Labs (all labs ordered are listed, but only abnormal results are displayed) Labs Reviewed  CBC WITH DIFFERENTIAL/PLATELET - Abnormal; Notable for the following components:      Result Value   Hemoglobin 11.0 (*)    HCT 37.2 (*)    MCH 24.0 (*)    MCHC 29.6 (*)    RDW 16.3 (*)    Eosinophils Absolute 0.6 (*)    All other components within normal limits  BASIC METABOLIC PANEL - Abnormal; Notable for the following components:   Glucose, Bld 104 (*)    All other components within normal limits  URINALYSIS, ROUTINE W REFLEX MICROSCOPIC - Abnormal; Notable for the following components:   Color, Urine STRAW (*)    All other components within normal limits  BASIC METABOLIC PANEL - Abnormal; Notable for the following components:   Chloride 97 (*)    Glucose, Bld 179 (*)    All other components within normal limits  GLUCOSE, CAPILLARY - Abnormal; Notable  for the following components:   Glucose-Capillary 118 (*)    All other components within normal limits  GLUCOSE, CAPILLARY - Abnormal; Notable for the following components:   Glucose-Capillary 145 (*)    All other components within normal limits  LACTATE DEHYDROGENASE - Abnormal; Notable for the following components:   LDH 204 (*)    All other components within normal limits  HEPATIC FUNCTION PANEL - Abnormal; Notable for the following components:   Total Bilirubin 0.2 (*)    All other components within normal limits  GLUCOSE, CAPILLARY - Abnormal; Notable for the following components:   Glucose-Capillary 126 (*)    All other components within normal limits  GLUCOSE, CAPILLARY - Abnormal; Notable for the following components:   Glucose-Capillary 146 (*)    All other components within normal limits  BASIC METABOLIC PANEL - Abnormal; Notable for the following components:   Chloride 97 (*)    Glucose, Bld 137 (*)    Creatinine, Ser 1.30 (*)    Calcium 8.8 (*)    All other components within normal limits  GLUCOSE, CAPILLARY - Abnormal; Notable for the following components:   Glucose-Capillary 155 (*)    All other components within normal limits  GLUCOSE, CAPILLARY - Abnormal; Notable for the following components:   Glucose-Capillary 107 (*)    All other components within normal limits  GLUCOSE, CAPILLARY - Abnormal; Notable for the following components:   Glucose-Capillary 106 (*)    All other components within normal limits  GLUCOSE, CAPILLARY - Abnormal; Notable for the following components:   Glucose-Capillary 200 (*)    All other components within normal limits  BASIC METABOLIC PANEL - Abnormal; Notable for the following components:   Chloride 97 (*)    Glucose, Bld 114 (*)    All other components within normal limits  GLUCOSE, CAPILLARY - Abnormal; Notable for the following components:   Glucose-Capillary 146 (*)  All other components within normal limits  GLUCOSE,  CAPILLARY - Abnormal; Notable for the following components:   Glucose-Capillary 135 (*)    All other components within normal limits  GLUCOSE, CAPILLARY - Abnormal; Notable for the following components:   Glucose-Capillary 121 (*)    All other components within normal limits  GLUCOSE, CAPILLARY - Abnormal; Notable for the following components:   Glucose-Capillary 110 (*)    All other components within normal limits  GLUCOSE, CAPILLARY - Abnormal; Notable for the following components:   Glucose-Capillary 110 (*)    All other components within normal limits  BASIC METABOLIC PANEL - Abnormal; Notable for the following components:   Chloride 92 (*)    Glucose, Bld 140 (*)    BUN 27 (*)    Creatinine, Ser 1.89 (*)    GFR calc non Af Amer 40 (*)    GFR calc Af Amer 46 (*)    All other components within normal limits  GLUCOSE, CAPILLARY - Abnormal; Notable for the following components:   Glucose-Capillary 126 (*)    All other components within normal limits  GLUCOSE, CAPILLARY - Abnormal; Notable for the following components:   Glucose-Capillary 149 (*)    All other components within normal limits  GLUCOSE, CAPILLARY - Abnormal; Notable for the following components:   Glucose-Capillary 101 (*)    All other components within normal limits  BASIC METABOLIC PANEL - Abnormal; Notable for the following components:   Chloride 93 (*)    Glucose, Bld 114 (*)    BUN 40 (*)    Creatinine, Ser 2.38 (*)    GFR calc non Af Amer 30 (*)    GFR calc Af Amer 35 (*)    All other components within normal limits  GLUCOSE, CAPILLARY - Abnormal; Notable for the following components:   Glucose-Capillary 110 (*)    All other components within normal limits  GLUCOSE, CAPILLARY - Abnormal; Notable for the following components:   Glucose-Capillary 106 (*)    All other components within normal limits  GLUCOSE, CAPILLARY - Abnormal; Notable for the following components:   Glucose-Capillary 108 (*)    All  other components within normal limits  CBG MONITORING, ED - Abnormal; Notable for the following components:   Glucose-Capillary 142 (*)    All other components within normal limits  SARS CORONAVIRUS 2 (HOSPITAL ORDER, [place] [facility])  NOVEL CORONAVIRUS, NAA (HOSPITAL ORDER, SEND-OUT TO REF LAB)  BRAIN NATRIURETIC PEPTIDE  FIBRINOGEN  C-REACTIVE PROTEIN  SEDIMENTATION RATE  D-DIMER, QUANTITATIVE (NOT AT [facility])  MAGNESIUM  GLUCOSE, CAPILLARY  BASIC METABOLIC PANEL  TROPONIN I (HIGH SENSITIVITY)  TROPONIN I (HIGH SENSITIVITY)    EKG EKG Interpretation  Date/Time:  Thursday April 11 2019 13:09:55 EDT Ventricular Rate:  91 PR Interval:    QRS Duration: 100 QT Interval:  332 QTC Calculation: 409 R Axis:   68 Text Interpretation:  Sinus rhythm Nonspecific T abnormalities, lateral leads Minimal ST elevation, anterior leads No significant change since last tracing Confirmed by Virgel Manifold [id number]) on 04/11/2019 1:20:31 PM   Radiology Dg Chest Portable 1 View  Result Date: 04/11/2019 CLINICAL DATA:  Fluid retention and shortness of breath. Recent positive COVID-19 test on 03/11/2019. EXAM: PORTABLE CHEST 1 VIEW COMPARISON:  04/01/2019 FINDINGS: Cardiac silhouette is mildly enlarged. No mediastinal or hilar masses. Lungs demonstrate prominent bronchovascular markings similar to prior exams. No lung consolidation. No pleural effusion or pneumothorax. Skeletal structures are grossly intact. IMPRESSION: No acute  cardiopulmonary disease. Electronically Signed   By: Lajean Manes M.D.   On: 04/11/2019 14:54    Procedures Procedures (including critical care time)  Medications Ordered in ED Medications  potassium chloride SA (K-DUR) CR tablet 60 mEq (has no administration in time range)  furosemide (LASIX) injection 80 mg (80 mg Intravenous Given 04/11/19 1502)  morphine 4 MG/ML injection 8 mg (4 mg Intravenous Given 04/11/19 1515)     Initial Impression / Assessment  and Plan / ED Course  I have reviewed the triage vital signs and the nursing notes.  Pertinent labs & imaging results that were available during my care of the patient were reviewed by me and considered in my medical decision making (see chart for details).     53 year old male with severe peripheral edema despite reported compliance with high dose of his diuretics.  At this point, I feel that he needs admitted for diuresis with IV medications.  We been able to accomplish this on outpatient basis at this time.  Final Clinical Impressions(s) / ED Diagnoses   Final diagnoses:  Edema    ED Discharge Orders    None       Virgel Manifold, MD 04/16/19 [phone]

## 2019-04-11 NOTE — ED Triage Notes (Signed)
[facility] reports that the patient is having fluid retention on the left side of his body and groin swelling. He also has weakness in his left arm and pain in his left shoulder. He has white discharge from his penis when he urinates.

## 2019-04-11 NOTE — ED Notes (Signed)
ED TO INPATIENT HANDOFF REPORT  ED Nurse Name and Phone #: [phone]  S Name/Age/Gender Benjamin [name] 53 y.o. male Room/Bed: 041C/041C  Code Status   Code Status: Full Code  Home/SNF/Other Boarding Home Patient oriented to: self, place, time and situation Is this baseline? Yes   Triage Complete: Triage complete  Chief Complaint Edema  Triage Note [facility] reports that the patient is having fluid retention on the left side of his body and groin swelling. He also has weakness in his left arm and pain in his left shoulder. He has white discharge from his penis when he urinates.    Allergies No Known Allergies  Level of Care/Admitting Diagnosis ED Disposition    ED Disposition Condition Comment   Admit  [facility] [[id number][facility]  Level of Care: Telemetry Cardiac [103]  Covid Evaluation: Person Under Investigation (PUI)  Diagnosis: Acute on chronic diastolic CHF (congestive heart failure) [facility](HCC) [[id number]]) [[id number]]  Admitting Physician: Chiquita LothELGERGAWY, DAWOOD S [[id number]]  Attending Physician: Randol KernELGERGAWY, DAWOOD S [[id number]]  Estimated length of stay: 3 - 4 days  Certification:: I certify this patient will need inpatient services for at least 2 midnights  Bed request comments: patient with know history of COVID 19, AWAITING CURRENT RESULTS FOR PLACMENT, if positive will go to [facility]  PT Class (Do Not Modify): Inpatient [101]  PT Acc Code (Do Not Modify): Private [1]       B Medical/Surgery History Past Medical History:  Diagnosis Date  . A-fib (HCC)   . Diabetes mellitus without complication ([facility])   . Elevated troponin 02/06/2019  . Hypertension   . Obesity   . Pulmonary embolism ([facility])   . Sleep apnea    Past Surgical History:  Procedure Laterality Date  . COLONOSCOPY WITH PROPOFOL Left 02/25/2019   Procedure: COLONOSCOPY WITH PROPOFOL;  Surgeon: Willis Modenautlaw, William, MD;  [facility];  Service: Endoscopy;  Laterality: Left;  . ESOPHAGOGASTRODUODENOSCOPY  (EGD) WITH PROPOFOL Left 02/25/2019   Procedure: ESOPHAGOGASTRODUODENOSCOPY (EGD) WITH PROPOFOL;  Surgeon: Willis Modenautlaw, William, MD;  [facility];  Service: Endoscopy;  Laterality: Left;  . NO PAST SURGERIES       A IV Location/Drains/Wounds Patient Lines/Drains/Airways Status   Active Line/Drains/Airways    Name:   Placement date:   Placement time:   Site:   Days:   Peripheral IV 04/11/19 Right Forearm   04/11/19    1330    Forearm   less than 1          Intake/Output Last 24 hours  Intake/Output Summary (Last 24 hours) at 04/11/2019 1913 Last data filed at 04/11/2019 1614 Gross per 24 hour  Intake -  Output 600 ml  Net -600 ml    Labs/Imaging Results for orders placed or performed during the hospital encounter of 04/11/19 (from the past 48 hour(s))  CBC with Differential     Status: Abnormal   Collection Time: 04/11/19  1:38 PM  Result Value Ref Range   WBC 8.2 4.0 - 10.5 K/uL   RBC 4.58 4.22 - 5.81 MIL/uL   Hemoglobin 11.0 (L) 13.0 - 17.0 g/dL   HCT 47.437.2 (L) 25.939.0 - 56.352.0 %   MCV 81.2 80.0 - 100.0 fL   MCH 24.0 (L) 26.0 - 34.0 pg   MCHC 29.6 (L) 30.0 - 36.0 g/dL   RDW 87.516.3 (H) 64.311.5 - 32.915.5 %   Platelets 400 150 - 400 K/uL   nRBC 0.0 0.0 - 0.2 %   Neutrophils Relative % 55 %  Neutro Abs 4.4 1.7 - 7.7 K/uL   Lymphocytes Relative 28 %   Lymphs Abs 2.3 0.7 - 4.0 K/uL   Monocytes Relative 10 %   Monocytes Absolute 0.9 0.1 - 1.0 K/uL   Eosinophils Relative 7 %   Eosinophils Absolute 0.6 (H) 0.0 - 0.5 K/uL   Basophils Relative 0 %   Basophils Absolute 0.0 0.0 - 0.1 K/uL   Immature Granulocytes 0 %   Abs Immature Granulocytes 0.03 0.00 - 0.07 K/uL    Comment: Performed at [facility], [address] [place], [place] [address]  Basic metabolic panel     Status: Abnormal   Collection Time: 04/11/19  1:38 PM  Result Value Ref Range   Sodium 138 135 - 145 mmol/L   Potassium 4.0 3.5 - 5.1 mmol/L   Chloride 100 98 - 111 mmol/L   CO2 31 22 - 32 mmol/L    Glucose, Bld 104 (H) 70 - 99 mg/dL   BUN 10 6 - 20 mg/dL   Creatinine, Ser 3.14 0.61 - 1.24 mg/dL   Calcium 9.1 8.9 - 38.8 mg/dL   GFR calc non Af Amer >60 >60 mL/min   GFR calc Af Amer >60 >60 mL/min   Anion gap 7 5 - 15    Comment: Performed at [facility], [address], [place], [place] [address]  Troponin I (High Sensitivity)     Status: None   Collection Time: 04/11/19  1:38 PM  Result Value Ref Range   Troponin I (High Sensitivity) 14 <18 ng/L    Comment: (NOTE) Elevated high sensitivity troponin I (hsTnI) values and significant  changes across serial measurements may suggest ACS but many other  chronic and acute conditions are known to elevate hsTnI results.  Refer to the "Links" section for chest pain algorithms and additional  guidance. Performed at [facility], [address] [place], [place] [address]   Troponin I (High Sensitivity)     Status: None   Collection Time: 04/11/19  3:38 PM  Result Value Ref Range   Troponin I (High Sensitivity) 16 <18 ng/L    Comment: (NOTE) Elevated high sensitivity troponin I (hsTnI) values and significant  changes across serial measurements may suggest ACS but many other  chronic and acute conditions are known to elevate hsTnI results.  Refer to the "Links" section for chest pain algorithms and additional  guidance. Performed at [facility], [address], [place], [place] [address]   Brain natriuretic peptide     Status: None   Collection Time: 04/11/19  4:10 PM  Result Value Ref Range   B Natriuretic Peptide 25.5 0.0 - 100.0 pg/mL    Comment: Performed at [facility], [address], [place], [place] [address]   Dg Chest Portable 1 View  Result Date: 04/11/2019 CLINICAL DATA:  Fluid retention and shortness of breath. Recent positive COVID-19 test on 03/11/2019. EXAM: PORTABLE CHEST 1 VIEW COMPARISON:  04/01/2019 FINDINGS: Cardiac silhouette is mildly enlarged. No mediastinal or hilar masses. Lungs  demonstrate prominent bronchovascular markings similar to prior exams. No lung consolidation. No pleural effusion or pneumothorax. Skeletal structures are grossly intact. IMPRESSION: No acute cardiopulmonary disease. Electronically Signed   By: Amie Portland M.D.   On: 04/11/2019 14:54    Pending Labs Unresulted Labs (From admission, onward)    Start     Ordered   04/11/19 1704  SARS Coronavirus 2 (CEPHEID - Performed in [facility]  hospital lab), Hosp Order  (Asymptomatic Patients Labs)  Once,   STAT    Question:  Rule Out  Answer:  Yes   04/11/19 1703   04/11/19 1338  Urinalysis, Routine w reflex microscopic  Once,   STAT     04/11/19 1339   Signed and Held  Basic metabolic panel  Daily,   R     Signed and Held          Vitals/Pain Today's Vitals   04/11/19 1653 04/11/19 1700 04/11/19 1730 04/11/19 1806  BP: (!) 148/71 (!) 119/93 121/83 (!) 171/91  Pulse: 88   96  Resp: 20 18 17 18   SpO2: 99%   99%  Weight:      Height:      PainSc:        Isolation Precautions No active isolations  Medications Medications  labetalol (NORMODYNE) tablet 200 mg (has no administration in time range)  0.9 %  sodium chloride infusion (has no administration in time range)  furosemide (LASIX) 250 mg in dextrose 5 % 250 mL (1 mg/mL) infusion (has no administration in time range)  insulin aspart (novoLOG) injection 0-5 Units (has no administration in time range)  insulin aspart (novoLOG) injection 0-15 Units (has no administration in time range)  furosemide (LASIX) injection 80 mg (80 mg Intravenous Given 04/11/19 1502)  morphine 4 MG/ML injection 8 mg (4 mg Intravenous Given 04/11/19 1515)  potassium chloride SA (K-DUR) CR tablet 60 mEq (60 mEq Oral Given 04/11/19 1655)    Mobility walks with person assist Low fall risk   Focused Assessments    R Recommendations: See Admitting Provider Note  Report given to:   Additional Notes:

## 2019-04-11 NOTE — Progress Notes (Signed)
Pt refusing bed alarm.

## 2019-04-12 ENCOUNTER — Inpatient Hospital Stay (HOSPITAL_COMMUNITY): Payer: Medicaid Other

## 2019-04-12 ENCOUNTER — Telehealth: Payer: Self-pay

## 2019-04-12 LAB — HEPATIC FUNCTION PANEL
ALT: 14 U/L (ref 0–44)
AST: 19 U/L (ref 15–41)
Albumin: 3.5 g/dL (ref 3.5–5.0)
Alkaline Phosphatase: 51 U/L (ref 38–126)
Bilirubin, Direct: <0.1 mg/dL (ref 0.0–0.2)
Total Bilirubin: 0.2 mg/dL — ABNORMAL LOW (ref 0.3–1.2)
Total Protein: 7.2 g/dL (ref 6.5–8.1)

## 2019-04-12 LAB — GLUCOSE, CAPILLARY
Glucose-Capillary: 145 mg/dL — ABNORMAL HIGH (ref 70–99)
Glucose-Capillary: 126 mg/dL — ABNORMAL HIGH (ref 70–99)
Glucose-Capillary: 146 mg/dL — ABNORMAL HIGH (ref 70–99)
Glucose-Capillary: 155 mg/dL — ABNORMAL HIGH (ref 70–99)

## 2019-04-12 LAB — BASIC METABOLIC PANEL
Anion gap: 12 (ref 5–15)
BUN: 11 mg/dL (ref 6–20)
CO2: 28 mmol/L (ref 22–32)
Calcium: 9.4 mg/dL (ref 8.9–10.3)
Chloride: 97 mmol/L — ABNORMAL LOW (ref 98–111)
Creatinine, Ser: 1.24 mg/dL (ref 0.61–1.24)
GFR calc Af Amer: >60 mL/min (ref >60)
GFR calc non Af Amer: >60 mL/min (ref >60)
Glucose, Bld: 179 mg/dL — ABNORMAL HIGH (ref 70–99)
Potassium: 4 mmol/L (ref 3.5–5.1)
Sodium: 137 mmol/L (ref 135–145)

## 2019-04-12 LAB — SEDIMENTATION RATE: Sed Rate: 10 mm/hr (ref 0–16)

## 2019-04-12 LAB — FIBRINOGEN: Fibrinogen: 408 mg/dL (ref 210–475)

## 2019-04-12 LAB — D-DIMER, QUANTITATIVE: D-Dimer, Quant: 0.37 ug/mL-FEU (ref 0.00–0.50)

## 2019-04-12 LAB — LACTATE DEHYDROGENASE: LDH: 204 U/L — ABNORMAL HIGH (ref 98–192)

## 2019-04-12 LAB — C-REACTIVE PROTEIN: CRP: <0.8 mg/dL (ref <1.0)

## 2019-04-12 MED ORDER — POLYVINYL ALCOHOL 1.4 % OP SOLN
2.0000 [drp] | Freq: Every day | OPHTHALMIC | Status: DC
  Administered 2019-04-12 – 2019-04-13 (×2): 2 [drp] via OPHTHALMIC
  Filled 2019-04-12: qty 15

## 2019-04-12 MED ORDER — LEVALBUTEROL TARTRATE 45 MCG/ACT IN AERO
2.0000 | INHALATION_SPRAY | Freq: Three times a day (TID) | RESPIRATORY_TRACT | Status: DC
  Administered 2019-04-12 – 2019-04-16 (×12): 2 via RESPIRATORY_TRACT
  Filled 2019-04-12: qty 15

## 2019-04-12 MED ORDER — METHOCARBAMOL 500 MG PO TABS
500.0000 mg | ORAL_TABLET | Freq: Three times a day (TID) | ORAL | Status: DC | PRN
  Administered 2019-04-12 – 2019-04-17 (×6): 500 mg via ORAL
  Filled 2019-04-12 (×6): qty 1

## 2019-04-12 MED ORDER — METHOCARBAMOL 500 MG PO TABS
500.0000 mg | ORAL_TABLET | ORAL | Status: DC | PRN
  Administered 2019-04-12: 10:00:00 500 mg via ORAL
  Filled 2019-04-12: qty 1

## 2019-04-12 MED ORDER — HYDRALAZINE HCL 20 MG/ML IJ SOLN
10.0000 mg | Freq: Four times a day (QID) | INTRAMUSCULAR | Status: DC | PRN
  Administered 2019-04-12: 10 mg via INTRAVENOUS
  Filled 2019-04-12 (×2): qty 1

## 2019-04-12 MED ORDER — IPRATROPIUM BROMIDE HFA 17 MCG/ACT IN AERS
2.0000 | INHALATION_SPRAY | Freq: Three times a day (TID) | RESPIRATORY_TRACT | Status: DC
  Administered 2019-04-12 – 2019-04-16 (×12): 2 via RESPIRATORY_TRACT
  Filled 2019-04-12: qty 12.9

## 2019-04-12 NOTE — Progress Notes (Signed)
In pt room MD was talking to the pt about past COVID virus and states "this time you x-rays still look fluffy". MD paged to clarify about [place] conversation with pt. Will continue to monitor pt.

## 2019-04-12 NOTE — Discharge Instructions (Addendum)
Heart Failure, Diagnosis  Heart failure means that your heart is not able to pump blood in the right way. This makes it hard for your body to work well. Heart failure is usually a long-term (chronic) condition. You must take good care of yourself and follow your treatment plan from your doctor. What are the causes? This condition may be caused by:  High blood pressure.  Build up of cholesterol and fat in the arteries.  Heart attack. This injures the heart muscle.  Heart valves that do not open and close properly.  Damage of the heart muscle. This is also called cardiomyopathy.  Lung disease.  Abnormal heart rhythms. What increases the risk? The risk of heart failure goes up as a person ages. This condition is also more likely to develop in people who:  Are overweight.  Are male.  Smoke or chew tobacco.  Abuse alcohol or illegal drugs.  Have taken medicines that can damage the heart.  Have diabetes.  Have abnormal heart rhythms.  Have thyroid problems.  Have low blood counts (anemia). What are the signs or symptoms? Symptoms of this condition include:  Shortness of breath.  Coughing.  Swelling of the feet, ankles, legs, or belly.  Losing weight for no reason.  Trouble breathing.  Waking from sleep because of the need to sit up and get more air.  Rapid heartbeat.  Being very tired.  Feeling dizzy, or feeling like you may pass out (faint).  Having no desire to eat.  Feeling like you may vomit (nauseous).  Peeing (urinating) more at night.  Feeling confused. How is this treated?     This condition may be treated with:  Medicines. These can be given to treat blood pressure and to make the heart muscles stronger.  Changes in your daily life. These may include eating a healthy diet, staying at a healthy body weight, quitting tobacco and illegal drug use, or doing exercises.  Surgery. Surgery can be done to open blocked valves, or to put  devices in the heart, such as pacemakers.  A donor heart (heart transplant). You will receive a healthy heart from a donor. Follow these instructions at home:  Treat other conditions as told by your doctor. These may include high blood pressure, diabetes, thyroid disease, or abnormal heart rhythms.  Learn as much as you can about heart failure.  Get support as you need it.  Keep all follow-up visits as told by your doctor. This is important. Summary  Heart failure means that your heart is not able to pump blood in the right way.  This condition is caused by high blood pressure, heart attack, or damage of the heart muscle.  Symptoms of this condition include shortness of breath and swelling of the feet, ankles, legs, or belly. You may also feel very tired or feel like you may vomit.  You may be treated with medicines, surgery, or changes in your daily life.  Treat other health conditions as told by your doctor. This information is not intended to replace advice given to you by your health care provider. Make sure you discuss any questions you have with your health care provider. Document Released: 06/14/2008 Document Revised: 11/23/2018 Document Reviewed: 11/23/2018 Elsevier Patient Education  2020 [employer]   Heart Failure Eating Plan Heart failure, also called congestive heart failure, occurs when your heart does not pump blood well enough to meet your body's needs for oxygen-rich blood. Heart failure is a long-term (chronic) condition. Living with  heart failure can be challenging. However, following your health care provider's instructions about a healthy lifestyle and working with a diet and nutrition specialist (dietitian) to choose the right foods may help to improve your symptoms. What are tips for following this plan? Reading food labels  Check food labels for the amount of sodium per serving. Choose foods that have less than 140 mg (milligrams) of sodium in each  serving.  Check food labels for the number of calories per serving. This is important if you need to limit your daily calorie intake to lose weight.  Check food labels for the serving size. If you eat more than one serving, you will be eating more sodium and calories than what is listed on the label.  Look for foods that are labeled as "sodium-free," "very low sodium," or "low sodium." ? Foods labeled as "reduced sodium" or "lightly salted" may still have more sodium than what is recommended for you. Cooking  Avoid adding salt when cooking. Ask your health care provider or dietitian before using salt substitutes.  Season food with salt-free seasonings, spices, or herbs. Check the label of seasoning mixes to make sure they do not contain salt.  Cook with heart-healthy oils, such as olive, canola, soybean, or sunflower oil.  Do not fry foods. Cook foods using low-fat methods, such as baking, boiling, grilling, and broiling.  Limit unhealthy fats when cooking by: ? Removing the skin from poultry, such as chicken. ? Removing all visible fats from meats. ? Skimming the fat off from stews, soups, and gravies before serving them. Meal planning   Limit your intake of: ? Processed, canned, or pre-packaged foods. ? Foods that are high in trans fat, such as fried foods. ? Sweets, desserts, sugary drinks, and other foods with added sugar. ? Full-fat dairy products, such as whole milk.  Eat a balanced diet that includes: ? 4-5 servings of fruit each day and 4-5 servings of vegetables each day. At each meal, try to fill half of your plate with fruits and vegetables. ? Up to 6-8 servings of whole grains each day. ? Up to 2 servings of lean meat, poultry, or fish each day. One serving of meat is equal to 3 oz. This is about the same size as a deck of cards. ? 2 servings of low-fat dairy each day. ? Heart-healthy fats. Healthy fats called omega-3 fatty acids are found in foods such as flaxseed and  cold-water fish like sardines, salmon, and mackerel.  Aim to eat 25-35 g (grams) of fiber a day. Foods that are high in fiber include apples, broccoli, carrots, beans, peas, and whole grains.  Do not add salt or condiments that contain salt (such as soy sauce) to foods before eating.  When eating at a restaurant, ask that your food be prepared with less salt or no salt, if possible.  Try to eat 2 or more vegetarian meals each week.  Eat more home-cooked food and eat less restaurant, buffet, and fast food. General information  Do not eat more than 2,300 mg of salt (sodium) a day. The amount of sodium that is recommended for you may be lower, depending on your condition.  Maintain a healthy body weight as directed. Ask your health care provider what a healthy weight is for you. ? Check your weight every day. ? Work with your health care provider and dietitian to make a plan that is right for you to lose weight or maintain your current weight.  Limit how  much fluid you drink. Ask your health care provider or dietitian how much fluid you can have each day.  Limit or avoid alcohol as told by your health care provider or dietitian. Recommended foods The items listed may not be a complete list. Talk with your dietitian about what dietary choices are best for you. Fruits All fresh, frozen, and canned fruits. Dried fruits, such as raisins, prunes, and cranberries. Vegetables All fresh vegetables. Vegetables that are frozen without sauce or added salt. Low-sodium or sodium-free canned vegetables. Grains Bread with less than 80 mg of sodium per slice. Whole-wheat pasta, quinoa, and brown rice. Oats and oatmeal. Barley. [place]. Grits and cream of wheat. Whole-grain and whole-wheat cold cereal. Meats and other protein foods Lean cuts of meat. Skinless chicken and [place]. Fish with high omega-3 fatty acids, such as salmon, sardines, and other cold-water fishes. Eggs. Dried beans, peas, and edamame.  Unsalted nuts and nut butters. Dairy Low-fat or nonfat (skim) milk and dried milk. Rice milk, soy milk, and almond milk. Low-fat or nonfat yogurt. Small amounts of reduced-sodium block cheese. Low-sodium cottage cheese. Fats and oils Olive, canola, soybean, flaxseed, or sunflower oil. Avocado. Sweets and desserts Apple sauce. Granola bars. Sugar-free pudding and gelatin. Frozen fruit bars. Seasoning and other foods Fresh and dried herbs. Lemon or lime juice. Vinegar. Low-sodium ketchup. Salt-free marinades, salad dressings, sauces, and seasonings. The items listed above may not be a complete list of foods and beverages you can eat. Contact a dietitian for more information. Foods to avoid The items listed may not be a complete list. Talk with your dietitian about what dietary choices are best for you. Fruits Fruits that are dried with sodium-containing preservatives. Vegetables Canned vegetables. Frozen vegetables with sauce or seasonings. Creamed vegetables. [place] fries. Onion rings. Pickled vegetables and sauerkraut. Grains Bread with more than 80 mg of sodium per slice. Hot or cold cereal with more than 140 mg sodium per serving. Salted pretzels and crackers. Pre-packaged breadcrumbs. Bagels, croissants, and biscuits. Meats and other protein foods Ribs and chicken wings. Bacon, ham, pepperoni, bologna, salami, and packaged luncheon meats. Hot dogs, bratwurst, and sausage. Canned meat. Smoked meat and fish. Salted nuts and seeds. Dairy Whole milk, half-and-half, and cream. Buttermilk. Processed cheese, cheese spreads, and cheese curds. Regular cottage cheese. Feta cheese. Shredded cheese. String cheese. Fats and oils Butter, lard, shortening, ghee, and bacon fat. Canned and packaged gravies. Seasoning and other foods Onion salt, garlic salt, table salt, and sea salt. Marinades. Regular salad dressings. Relishes, pickles, and olives. Meat flavorings and tenderizers, and bouillon cubes.  Horseradish, ketchup, and mustard. Worcestershire sauce. Teriyaki sauce, soy sauce (including reduced sodium). Hot sauce and Tabasco sauce. Steak sauce, fish sauce, oyster sauce, and cocktail sauce. Taco seasonings. Barbecue sauce. Tartar sauce. The items listed above may not be a complete list of foods and beverages you should avoid. Contact a dietitian for more information. Summary  A heart failure eating plan includes changes that limit your intake of sodium and unhealthy fat, and it may help you lose weight or maintain a healthy weight. Your health care provider may also recommend limiting how much fluid you drink.  Most people with heart failure should eat no more than 2,300 mg of salt (sodium) a day. The amount of sodium that is recommended for you may be lower, depending on your condition.  Contact your health care provider or dietitian before making any major changes to your diet. This information is not intended to replace advice given  to you by your health care provider. Make sure you discuss any questions you have with your health care provider. Document Released: 01/20/2017 Document Revised: 11/01/2018 Document Reviewed: 01/20/2017 Elsevier Patient Education  2020 [employer] Inc.   Living With Heart Failure  Heart failure is a long-term (chronic) condition in which the heart cannot pump enough blood through the body. When this happens, parts of the body do not get the blood and oxygen they need. There is no cure for heart failure at this time, so it is important for you to take good care of yourself and follow the treatment plan set by your health care provider. If you are living with heart failure, there are ways to help you manage the disease. Follow these instructions at home: Living with heart failure requires you to make changes in your life. Your health care team will teach you about the changes you need to make in order to relieve your symptoms and lower your risk of going to the  hospital. Follow the treatment plan as set by your health care provider. Medicines Medicines are important in reducing your heart's workload, slowing the progression of heart failure, and improving your symptoms.  Take over-the-counter and prescription medicines only as told by your health care provider.  Do not stop taking your medicine unless your health care provider tells you to do that.  Do not skip any dose of your medicine.  Refill prescriptions before you run out of medicine. You need your medicines every day. Eating and drinking   Eat heart-healthy foods. Talk with a dietitian to make an eating plan that is right for you. ? If directed by your health care provider: ? Limit salt (sodium). Lowering your sodium intake may reduce symptoms of heart failure. Ask a dietitian to recommend heart-healthy seasonings. ? Limit your fluid intake. Fluid restriction may reduce symptoms of heart failure. ? Use low-fat cooking methods instead of frying. Low-fat methods include roasting, grilling, broiling, baking, poaching, steaming, and stir-frying. ? Choose foods that contain no trans fat and are low in saturated fat and cholesterol. Healthy choices include fresh or frozen fruits and vegetables, fish, lean meats, legumes, fat-free or low-fat dairy products, and whole-grain or high-fiber foods.  Limit alcohol intake to no more than 1 drink a day for nonpregnant women and 2 drinks a day for men. One drink equals 12 oz of beer, 5 oz of wine, or 1 oz of hard liquor. ? Drinking more than that is harmful to your heart. Tell your health care provider if you drink alcohol several times a week. ? Talk with your health care provider about whether any level of alcohol use is safe for you. Activity   Ask your health care provider about attending cardiac rehabilitation. These programs include aerobic physical activity, which provides many benefits for your heart.  If no cardiac rehabilitation program is  available, ask your health care provider what aerobic exercises are safe for you to do. Lifestyle Make the lifestyle changes recommended by your health care provider. In general:  Lose weight if your health care provider tells you to do that. Weight loss may reduce symptoms of heart failure.  Do not use any products that contain nicotine or tobacco, such as cigarettes or e-cigarettes. If you need help quitting, ask your health care provider.  Do not use street (illegal) drugs.  Return to your normal activities as told by your health care provider. Ask your health care provider what activities are safe for you. General  instructions   Make sure you weigh yourself every day to track your weight. Rapid weight gain may indicate an increase in fluid in your body and may increase the workload of your heart. ? Weigh yourself every morning. Do this after you urinate but before you eat breakfast. ? Wear the same type of clothing, without shoes, each time you weigh yourself. ? Weigh yourself on the same scale and in the same spot each time.  Living with chronic heart failure often leads to emotions such as fear, stress, anxiety, and depression. If you feel any of these emotions and need help coping, contact your health care provider. Other ways to get help include: ? Talking to friends and family members about your condition. They can give you support and guidance. Explain your symptoms to them and, if comfortable, invite them to attend appointments or rehabilitation with you. ? Joining a support group for people with chronic heart failure. Talking with other people who have the same symptoms may give you new ways of coping with your disease and your emotions.  Stay up to date with your shots (vaccines). Staying current on pneumococcal and influenza vaccines is especially important in preventing germs from attacking your airways (respiratory infections).  Keep all follow-up visits as told by your  health care provider. This is important. How to recognize changes in your condition You and your family members need to know what changes to watch for in your condition. Watch for the following changes and report them to your health care provider:  Sudden weight gain. Ask your health care provider what amount of weight gain to report.  Shortness of breath: ? Feeling short of breath while at rest, with no exercise or activity that required great effort. ? Feeling breathless with activity.  Swelling of your lower legs or ankles.  Difficulty sleeping: ? You wake up feeling short of breath. ? You have to use more pillows to raise your head in order to sleep.  Frequent, dry, hacking cough.  Loss of appetite.  Feeling more tired all the time.  Depression or feelings of sadness or hopelessness.  Bloating in the stomach. Where to find more information  Local support groups. Ask your health care provider about groups near you.  The American Heart Association: www.heart.org Contact a health care provider if:  You have a rapid weight gain.  You have increasing shortness of breath that is unusual for you.  You are unable to participate in your usual physical activities.  You tire easily.  You cough more than normal, especially with physical activity.  You have any swelling or more swelling in areas such as your hands, feet, ankles, or abdomen.  You feel like your heart is beating quickly (palpitations).  You become dizzy or light-headed when you stand up. Get help right away if:  You have difficulty breathing.  You notice or your family notices a change in your awareness, such as having trouble staying awake or having difficulty with concentration.  You have pain or discomfort in your chest.  You have an episode of fainting (syncope). Summary  There is no cure for heart failure, so it is important for you to take good care of yourself and follow the treatment plan set  by your health care provider.  Medicines are important in reducing your heart's workload, slowing the progression of heart failure, and improving your symptoms.  Living with chronic heart failure often leads to emotions such as fear, stress, anxiety, and depression. If  you are feeling any of these emotions and need help coping, contact your health care provider. This information is not intended to replace advice given to you by your health care provider. Make sure you discuss any questions you have with your health care provider. Document Released: 01/18/2017 Document Revised: 08/18/2017 Document Reviewed: 01/18/2017 Elsevier Patient Education  2020 [place].   Sequential Compression Device A sequential compression device is an inflatable cuff or sleeve that you wear around your arm, leg, or foot. The cuff or sleeve squeezes your blood vessels together to improve the flow of blood. You may need this device if your arms or legs are swollen from poor circulation. You may also need it to prevent a blood clot from forming after surgery or if you are on bed rest. Blood clots can travel to your lungs and cause life-threatening problems. Your health care provider will decide when you should use the device, how often it will inflate, and how much pressure it will apply. Tell a health care provider about:  Any allergies you have.  All medicines you are taking, including vitamins, herbs, creams, and over-the-counter medicines.  Any blood disorders you have.  Any surgeries you have had.  Any medical conditions you have. What are the benefits? The main benefit of using this device is improved circulation. Using the device may:  Reduce swelling (edema).  Improve healing if you have leg or foot ulcers from poor circulation.  Lower your risk of blood clots.  Help you begin walking and recovering sooner after surgery or bed rest. What are the risks? Generally, this is a safe treatment.  However, problems may occur, including:  Irritated or sore skin.  An injury to the nerve that supplies your lower legs, feet, and toes (peroneal nerve). If the device puts too much pressure on your legs, this nerve may temporarily stop working normally. If this happens, your ankle may feel numb or weak.  Compartment syndrome. This is a condition in which too much pressure on your leg makes it hard for blood to reach the muscles of those extremities. This can result in injury to your muscles. The risk of developing this condition is low. What happens before the treatment?  You will wear loose-fitting clothing that is free of zippers, snaps, or buttons. These fasteners may cause discomfort or skin irritation.  You will lie down on a bed or padded table.  Your health care provider will place an inflatable cuff or sleeve around your arm, leg, or foot. What happens during the treatment?   You will lie still with the cuff or sleeve around your arm, leg, or foot.  The cuff or sleeve will inflate around your arm, leg, or foot at a programmed rate. The machine pumps air in and out of the cuff or sleeve. The pump will inflate different sections of the cuff. The treatment will feel like a massage.  Tell your health care provider if you feel pain, get too hot, or feel uncomfortable.  Ask for assistance if you need to get up. Do not walk with the compression cuffs in place due to the risk of falling. What can I expect after the treatment?  Return to your normal activities as told by your health care provider. Ask your health care provider what activities are safe for you.  Keep all follow-up visits as told by your health care provider. This is important. What are some other ways to prevent blood clots? A sequential compression device is often  used with other methods to prevent blood clots in your legs. Other methods to prevent blood clots include:  Taking a blood-thinning medicine as told by your  health care provider.  Wearing compression stockings.  Using your leg muscles and getting up to walk as soon as you can. Contact a health care provider if:  You develop any pain or numbness in your leg or foot.  You develop any redness or your skin breaks down where the device touches your skin.  Your device stops working. Get help right away if:  Your arm, leg, or foot becomes warm, red, or swollen.  You have severe pain in your leg or foot.  You have chest pain.  You have trouble breathing. Summary  A sequential compression device is used to improve the blood flow in your arm, leg, or foot.  The main benefit of using this device is improved circulation, but it may also reduce swelling, improve healing, and lower your risk of blood clots.  Contact a health care provider if you develop any skin breakdown, pain, or numbness in your leg or foot.  Get help right away if you have trouble breathing. This information is not intended to replace advice given to you by your health care provider. Make sure you discuss any questions you have with your health care provider. Document Released: 10/08/2010 Document Revised: 11/21/2018 Document Reviewed: 11/21/2018 Elsevier Patient Education  2020 [employer] Inc.   Peripheral Edema  Peripheral edema is swelling that is caused by a buildup of fluid. Peripheral edema most often affects the lower legs, ankles, and feet. It can also develop in the arms, hands, and face. The area of the body that has peripheral edema will look swollen. It may also feel heavy or warm. Your clothes may start to feel tight. Pressing on the area may make a temporary dent in your skin. You may not be able to move your swollen arm or leg as much as usual. There are many causes of peripheral edema. It can happen because of a complication of other conditions such as congestive heart failure, kidney disease, or a problem with your blood circulation. It also can be a side  effect of certain medicines or because of an infection. It often happens to women during pregnancy. Sometimes, the cause is not known. Follow these instructions at home: Managing pain, stiffness, and swelling   Raise (elevate) your legs while you are sitting or lying down.  Move around often to prevent stiffness and to lessen swelling.  Do not sit or stand for long periods of time.  Wear support stockings as told by your health care provider. Medicines  Take over-the-counter and prescription medicines only as told by your health care provider.  Your health care provider may prescribe medicine to help your body get rid of excess water (diuretic). General instructions  Pay attention to any changes in your symptoms.  Follow instructions from your health care provider about limiting salt (sodium) in your diet. Sometimes, eating less salt may reduce swelling.  Moisturize skin daily to help prevent skin from cracking and draining.  Keep all follow-up visits as told by your health care provider. This is important. Contact a health care provider if you have:  A fever.  Edema that starts suddenly or is getting worse, especially if you are pregnant or have a medical condition.  Swelling in only one leg.  Increased swelling, redness, or pain in one or both of your legs.  Drainage or sores at  the area where you have edema. Get help right away if you:  Develop shortness of breath, especially when you are lying down.  Have pain in your chest or abdomen.  Feel weak.  Feel faint. Summary  Peripheral edema is swelling that is caused by a buildup of fluid. Peripheral edema most often affects the lower legs, ankles, and feet.  Move around often to prevent stiffness and to lessen swelling. Do not sit or stand for long periods of time.  Pay attention to any changes in your symptoms.  Contact a health care provider if you have edema that starts suddenly or is getting worse, especially  if you are pregnant or have a medical condition.  Get help right away if you develop shortness of breath, especially when lying down. This information is not intended to replace advice given to you by your health care provider. Make sure you discuss any questions you have with your health care provider. Document Released: 10/13/2004 Document Revised: 05/30/2018 Document Reviewed: 05/30/2018 Elsevier Patient Education  2020 [employer] Inc[employer]   Scrotal Swelling Scrotal swelling refers to a condition in which the sac of skin that contains the testes (scrotum) is enlarged or swollen. Many things can cause the scrotum to enlarge or swell, including:  Fluid around the testicle (hydrocele).  A weakened area in the muscles around the groin (hernia).  An enlarged vein around the testicle (varicocele).  An injury.  An infection.  Certain medical treatments.  Certain medical conditions, such as congestive heart failure.  A recent genital surgery or procedure.  A twisting of the spermatic cord that cuts off blood supply (testicular torsion).  Testicular cancer. Scrotal swelling can happen along with scrotal pain. Follow these instructions at home:  Until the swelling goes away: ? Rest. The best position to rest in is to lie down. ? Limit activity.  Put ice on the scrotum: ? Put ice in a plastic bag. ? Place a towel between your skin and the bag. ? Leave the ice on for 20 minutes, 2-3 times a day for 1-2 days.  Place a rolled towel under your testicles for support.  Wear loose-fitting clothing or an athletic support cup for comfort.  Take over-the-counter and prescription medicines only as told by your health care provider.  Perform a monthly self-exam of the scrotum and penis. Feel for changes. Ask your health care provider how to perform a monthly self-exam if you are unsure. Contact a health care provider if:  You have a sudden pain that is persistent and does not  improve.  You have a heavy feeling or notice fluid in the scrotum.  You have pain or burning while urinating.  You have blood in your urine or semen.  You feel a lump around the testicle.  You notice that one testicle is larger than the other. Keep in mind that a small difference in size is normal.  You have a persistent dull ache or pain in your groin or scrotum. Get help right away if:  The pain does not go away.  The pain becomes severe.  You have a fever or chills.  You have pain or vomiting that cannot be controlled.  One or both sides of the scrotum are very red and swollen.  There is redness spreading upward from your scrotum to your abdomen or downward from your scrotum to your thighs. Summary  Scrotal swelling refers to a condition in which the sac of skin that contains the testes (scrotum) is enlarged.  Many things can cause the scrotum to swell, including hydrocele, a hernia, and a varicocele.  Limiting activity and icing the scrotum may help reduce swelling and pain.  Contact your health care provider if you develop scrotal pain that is sudden and persistent, or if you have pain while urinating. Do this also if you feel a lump around the testicle or notice blood in your urine or semen.  Get help right away for uncontrolled pain or vomiting, for very red and swollen scrotum, or for fever or chills. This information is not intended to replace advice given to you by your health care provider. Make sure you discuss any questions you have with your health care provider. Document Released: 10/08/2010 Document Revised: 08/18/2017 Document Reviewed: 11/21/2016 Elsevier Patient Education  2020 [employer] Information on my medicine - XARELTO (Rivaroxaban)   Why was Xarelto prescribed for you? Xarelto was prescribed for you to reduce the risk of a blood clot forming that can cause a stroke if you have a medical condition called atrial fibrillation (a type of  irregular heartbeat).  What do you need to know about xarelto ? Take your Xarelto ONCE DAILY at the same time every day with your evening meal. If you have difficulty swallowing the tablet whole, you may crush it and mix in applesauce just prior to taking your dose.  Take Xarelto exactly as prescribed by your doctor and DO NOT stop taking Xarelto without talking to the doctor who prescribed the medication.  Stopping without other stroke prevention medication to take the place of Xarelto may increase your risk of developing a clot that causes a stroke.  Refill your prescription before you run out.  After discharge, you should have regular check-up appointments with your healthcare provider that is prescribing your Xarelto.  In the future your dose may need to be changed if your kidney function or weight changes by a significant amount.  What do you do if you miss a dose? If you are taking Xarelto ONCE DAILY and you miss a dose, take it as soon as you remember on the same day then continue your regularly scheduled once daily regimen the next day. Do not take two doses of Xarelto at the same time or on the same day.   Important Safety Information A possible side effect of Xarelto is bleeding. You should call your healthcare provider right away if you experience any of the following: ? Bleeding from an injury or your nose that does not stop. ? Unusual colored urine (red or dark brown) or unusual colored stools (red or black). ? Unusual bruising for unknown reasons. ? A serious fall or if you hit your head (even if there is no bleeding).  Some medicines may interact with Xarelto and might increase your risk of bleeding while on Xarelto. To help avoid this, consult your healthcare provider or pharmacist prior to using any new prescription or non-prescription medications, including herbals, vitamins, non-steroidal anti-inflammatory drugs (NSAIDs) and supplements.  This website has more  information on Xarelto: [email or web address].  Heart Healthy, Consistent Carbohydrate Nutrition Therapy   A heart-healthy and consistent carbohydrate diet is recommended to manage heart disease and diabetes. To follow a heart-healthy and consistent carbohydrate diet,  Eat a balanced diet with whole grains, fruits and vegetables, and lean protein sources.   Choose heart-healthy unsaturated fats. Limit saturated fats, trans fats, and cholesterol intake. Eat more plant-based or vegetarian meals using beans and soy foods for protein.   Eat whole, unprocessed foods  to limit the amount of sodium (salt) you eat.   Choose a consistent amount of carbohydrate at each meal and snack. Limit refined carbohydrates especially sugar, sweets and sugar-sweetened beverages.   If you drink alcohol, do so in moderation: one serving per day (women) and two servings per day (men). o One serving is equivalent to 12 ounces beer, 5 ounces wine, or 1.5 ounces distilled spirits  Tips Tips for Choosing Heart-Healthy Fats Choose lean protein and low-fat dairy foods to reduce saturated fat intake.  Saturated fat is usually found in animal-based protein and is associated with certain health risks. Saturated fat is the biggest contributor to raise low-density lipoprotein (LDL) cholesterol levels. Research shows that limiting saturated fat lowers unhealthy cholesterol levels. Eat no more than 7% of your total calories each day from saturated fat. Ask your RDN to help you determine how much saturated fat is right for you.  There are many foods that do not contain large amounts of saturated fats. Swapping these foods to replace foods high in saturated fats will help you limit the saturated fat you eat and improve your cholesterol levels. You can also try eating more plant-based or vegetarian meals. Instead of Try:  Whole milk, cheese, yogurt, and ice cream 1% or skim milk, low-fat cheese, non-fat yogurt, and low-fat ice cream   Fatty, marbled beef and pork Lean beef, pork, or venison  Poultry with skin Poultry without skin  Butter, stick margarine Reduced-fat, whipped, or liquid spreads  Coconut oil, palm oil Liquid vegetable oils: corn, canola, olive, soybean and safflower oils   Avoid foods that contain trans fats.  Trans fats increase levels of LDL-cholesterol. Hydrogenated fat in processed foods is the main source of trans fats in foods.   Trans fats can be found in stick margarine, shortening, processed sweets, baked goods, some fried foods, and packaged foods made with hydrogenated oils. Avoid foods with partially hydrogenated oil on the ingredient list such as: cookies, pastries, baked goods, biscuits, crackers, microwave popcorn, and frozen dinners. Choose foods with heart healthy fats.  Polyunsaturated and monounsaturated fat are unsaturated fats that may help lower your blood cholesterol level when used in place of saturated fat in your diet.  Ask your RDN about taking a dietary supplement with plant sterols and stanols to help lower your cholesterol level.  Research shows that substituting saturated fats with unsaturated fats is beneficial to cholesterol levels. Try these easy swaps: Instead of Try:  Butter, stick margarine, or solid shortening Reduced-fat, whipped, or liquid spreads  Beef, pork, or poultry with skin Fish and seafood  Chips, crackers, snack foods Raw or unsalted nuts and seeds or nut butters Hummus with vegetables Avocado on toast  Coconut oil, palm oil Liquid vegetable oils: corn, canola, olive, soybean and safflower oils  Limit the amount of cholesterol you eat to less than 200 milligrams per day.  Cholesterol is a substance carried through the bloodstream via lipoproteins, which are known as transporters of fat. Some body functions need cholesterol to work properly, but too much cholesterol in the bloodstream can damage arteries and build up blood vessel linings (which can  lead to heart attack and stroke). You should eat less than 200 milligrams cholesterol per day.  People respond differently to eating cholesterol. There is no test available right now that can figure out which people will respond more to dietary cholesterol and which will respond less. For individuals with high intake of dietary cholesterol, different types of increase (none, small, moderate,  large) in LDL-cholesterol levels are all possible.   Food sources of cholesterol include egg yolks and organ meats such as liver, gizzards. Limit egg yolks to two to four per week and avoid organ meats like liver and gizzards to control cholesterol intake. Tips for Choosing Heart-Healthy Carbohydrates Consume a consistent amount of carbohydrate  It is important to eat foods with carbohydrates in moderation because they impact your blood glucose level. Carbohydrates can be found in many foods such as:  Grains (breads, crackers, rice, pasta, and cereals)   Starchy Vegetables (potatoes, corn, and peas)   Beans and legumes   Milk, soy milk, and yogurt   Fruit and fruit juice   Sweets (cakes, cookies, ice cream, jam and jelly)  Your RDN will help you set a goal for how many carbohydrate servings to eat at your meals and snacks. For many adults, eating 3 to 5 servings of carbohydrate foods at each meal and 1 or 2 carbohydrate servings for each snack works well.   Check your blood glucose level regularly. It can tell you if you need to adjust when you eat carbohydrates.  Choose foods rich in viscous (soluble) fiber  Viscous, or soluble, is found in the walls of plant cells. Viscous fiber is found only in plant-based foods. Eating foods with fiber helps to lower your unhealthy cholesterol and keep your blood glucose in range   Rich sources of viscous fiber include vegetables (asparagus, Brussels sprouts, sweet potatoes, turnips) fruit (apricots, mangoes, oranges), legumes, and whole grains (barley, oats,  and oat bran).   As you increase your fiber intake gradually, also increase the amount of water you drink. This will help prevent constipation.   If you have difficulty achieving this goal, ask your RDN about fiber laxatives. Choose fiber supplements made with viscous fibers such as psyllium seed husks or methylcellulose to help lower unhealthy cholesterol.   Limit refined carbohydrates   There are three types of carbohydrates: starches, sugar, and fiber. Some carbohydrates occur naturally in food, like the starches in rice or corn or the sugars in fruits and milk. Refined carbohydrates--foods with high amounts of simple sugars--can raise triglyceride levels. High triglyceride levels are associated with coronary heart disease.  Some examples of refined carbohydrate foods are table sugar, sweets, and beverages sweetened with added sugar. Tips for Reducing Sodium (Salt) Although sodium is important for your body to function, too much sodium can be harmful for people with high blood pressure. As sodium and fluid buildup in your tissues and bloodstream, your blood pressure increases. High blood pressure may cause damage to other organs and increase your risk for a stroke. Even if you take a pill for blood pressure or a water pill (diuretic) to remove fluid, it is still important to have less salt in your diet. Ask your doctor and RDN what amount of sodium is right for you.  Avoid processed foods. Eat more fresh foods.   Fresh fruits and vegetables are naturally low in sodium, as well as frozen vegetables and fruits that have no added juices or sauces.   Fresh meats are lower in sodium than processed meats, such as bacon, sausage, and hotdogs. Read the nutrition label or ask your butcher to help you find a fresh meat that is low in sodium.  Eat less salt--at the table and when cooking.   A single teaspoon of table salt has 2,300 mg of sodium.   Leave the salt out of recipes for pasta, casseroles,  and soups.  Ask your RDN how to cook your favorite recipes without sodium  Be a smart shopper.   Look for food packages that say salt-free or sodium-free. These items contain less than 5 milligrams of sodium per serving.   Very low-sodium products contain less than 35 milligrams of sodium per serving.   Low-sodium products contain less than 140 milligrams of sodium per serving.   Beware for Unsalted or No Added Salt products. These items may still be high in sodium. Check the nutrition label.  Add flavors to your food without adding sodium.   Try lemon juice, lime juice, fruit juice or vinegar.   Dry or fresh herbs add flavor. Try basil, bay leaf, dill, rosemary, parsley, sage, dry mustard, nutmeg, thyme, and paprika.   Pepper, red pepper flakes, and cayenne pepper can add spice t your meals without adding sodium. Hot sauce contains sodium, but if you use just a drop or two, it will not add up to much.   Buy a sodium-free seasoning blend or make your own at home. Additional Lifestyle Tips Achieve and maintain a healthy weight.  Talk with your RDN or your doctor about what is a healthy weight for you.  Set goals to reach and maintain that weight.   To lose weight, reduce your calorie intake along with increasing your physical activity. A weight loss of 10 to 15 pounds could reduce LDL-cholesterol by 5 milligrams per deciliter. Participate in physical activity.  Talk with your health care team to find out what types of physical activity are best for you. Set a plan to get about 30 minutes of exercise on most days.  Foods Recommended Food Group Foods Recommended  Grains Whole grain breads and cereals, including whole wheat, barley, rye, buckwheat, corn, teff, quinoa, millet, amaranth, brown or wild rice, sorghum, and oats Pasta, especially whole wheat or other whole grain types  [employer] [place] [employer], quinoa or wild rice Whole grain crackers, bread, rolls, pitas Home-made  bread with reduced-sodium baking soda  Protein Foods Lean cuts of beef and pork (loin, leg, round, extra lean hamburger)  Skinless [employer] photographer and other wild game Dried beans and peas Nuts and nut butters Meat alternatives made with soy or textured vegetable protein  Egg whites or egg substitute Cold cuts made with lean meat or soy protein  Dairy Nonfat (skim), low-fat, or 1%-fat milk  Nonfat or low-fat yogurt or cottage cheese Fat-free and low-fat cheese  Vegetables Fresh, frozen, or canned vegetables without added fat or salt   Fruits Fresh, frozen, canned, or dried fruit   Oils Unsaturated oils (corn, olive, peanut, soy, sunflower, canola)  Soft or liquid margarines and vegetable oil spreads  Salad dressings Seeds and nuts  Avocado   Foods Not Recommended Food Group Foods Not Recommended  Grains Breads or crackers topped with salt Cereals (hot or cold) with more than 300 mg sodium per serving Biscuits, cornbread, and other quick breads prepared with baking soda Bread crumbs or stuffing mix from a store High-fat bakery products, such as doughnuts, biscuits, croissants, danish pastries, pies, cookies Instant cooking foods to which you add hot water and stir--potatoes, noodles, rice, etc. Packaged starchy foods--seasoned noodle or rice dishes, stuffing mix, macaroni and cheese dinner Snacks made with partially hydrogenated oils, including chips, cheese puffs, snack mixes, regular crackers, butter-flavored popcorn  Protein Foods Higher-fat cuts of meats (ribs, t-bone steak, regular hamburger) Bacon, sausage, or hot dogs Cold cuts, such as salami or bologna, deli meats, cured meats, corned beef  Organ meats (liver, brains, gizzards, sweetbreads) Poultry with skin Fried or smoked meat, poultry, and fish Whole eggs and egg yolks (more than 2-4 per week) Salted legumes, nuts, seeds, or nut/seed butters Meat alternatives with high levels of sodium (>300 mg per serving) or  saturated fat (>5 g per serving)  Dairy Whole milk,?2% fat milk, buttermilk Whole milk yogurt or ice cream Cream Half-&-half Cream cheese Sour cream Cheese  Vegetables Canned or frozen vegetables with salt, fresh vegetables prepared with salt, butter, cheese, or cream sauce Fried vegetables Pickled vegetables such as olives, pickles, or sauerkraut  Fruits Fried fruits Fruits served with butter or cream  Oils Butter, stick margarine, shortening Partially hydrogenated oils or trans fats Tropical oils (coconut, palm, palm kernel oils)  Other Candy, sugar sweetened soft drinks and desserts Salt, sea salt, garlic salt, and seasoning mixes containing salt Bouillon cubes Ketchup, barbecue sauce, Worcestershire sauce, soy sauce, teriyaki sauce Miso Salsa Pickles, olives, relish   Heart Healthy Consistent Carbohydrate Vegetarian (Lacto-Ovo) Sample 1-Day Menu  Breakfast 1 cup oatmeal, cooked (2 carbohydrate servings)   cup blueberries (1 carbohydrate serving)  11 almonds, without salt  1 cup 1% milk (1 carbohydrate serving)  1 cup coffee  Morning Snack 1 cup fat-free plain yogurt (1 carbohydrate serving)  Lunch 1 whole wheat bun (1 carbohydrate servings)  1 black bean burger (1 carbohydrate servings)  1 slice cheddar cheese, low sodium  2 slices tomatoes  2 leaves lettuce  1 teaspoon mustard  1 small pear (1 carbohydrate servings)  1 cup green tea, unsweetened  Afternoon Snack 1/3 cup trail mix with nuts, seeds, and raisins, without salt (1 carbohydrate servinga)  Evening Meal  cup meatless chicken  2/3 cup brown rice, cooked (2 carbohydrate servings)  1 cup broccoli, cooked (2/3 carbohydrate serving)   cup carrots, cooked (1/3 carbohydrate serving)  2 teaspoons olive oil  1 teaspoon balsamic vinegar  1 whole wheat dinner roll (1 carbohydrate serving)  1 teaspoon margarine, soft, tub  1 cup 1% milk (1 carbohydrate serving)  Evening Snack 1 extra small banana (1  carbohydrate serving)  1 tablespoon peanut butter   Heart Healthy Consistent Carbohydrate Vegan Sample 1-Day Menu  Breakfast 1 cup oatmeal, cooked (2 carbohydrate servings)   cup blueberries (1 carbohydrate serving)  11 almonds, without salt  1 cup soymilk fortified with calcium, vitamin B12, and vitamin D  1 cup coffee  Morning Snack 6 ounces soy yogurt (1 carbohydrate servings)  Lunch 1 whole wheat bun(1 carbohydrate servings)  1 black bean burger (1 carbohydrate serving)  2 slices tomatoes  2 leaves lettuce  1 teaspoon mustard  1 small pear (1 carbohydrate servings)  1 cup green tea, unsweetened  Afternoon Snack 1/3 cup trail mix with nuts, seeds, and raisins, without salt (1 carbohydrate servings)  Evening Meal  cup meatless chicken  2/3 cup brown rice, cooked (2 carbohydrate servings)  1 cup broccoli, cooked (2/3 carbohydrate serving)   cup carrots, cooked (1/3 carbohydrate serving)  2 teaspoons olive oil  1 teaspoon balsamic vinegar  1 whole wheat dinner roll (1 carbohydrate serving)  1 teaspoon margarine, soft, tub  1 cup soymilk fortified with calcium, vitamin B12, and vitamin D  Evening Snack 1 extra small banana (1 carbohydrate serving)  1 tablespoon peanut butter    Heart Healthy Consistent Carbohydrate Sample 1-Day Menu  Breakfast 1 cup cooked oatmeal (2 carbohydrate servings)  3/4 cup blueberries (1 carbohydrate serving)  1 ounce almonds  1 cup skim milk (  1 carbohydrate serving)  1 cup coffee  Morning Snack 1 cup sugar-free nonfat yogurt (1 carbohydrate serving)  Lunch 2 slices whole-wheat bread (2 carbohydrate servings)  2 ounces lean [place] breast  1 ounce low-fat Swiss cheese  1 teaspoon mustard  1 slice tomato  1 lettuce leaf  1 small pear (1 carbohydrate serving)  1 cup skim milk (1 carbohydrate serving)  Afternoon Snack 1 ounce trail mix with unsalted nuts, seeds, and raisins (1 carbohydrate serving)  Evening Meal 3 ounces salmon  2/3 cup  cooked brown rice (2 carbohydrate servings)  1 teaspoon soft margarine  1 cup cooked broccoli with 1/2 cup cooked carrots (1 carbohydrate serving  Carrots, cooked, boiled, drained, without salt  1 cup lettuce  1 teaspoon olive oil with vinegar for dressing  1 small whole grain roll (1 carbohydrate serving)  1 teaspoon soft margarine  1 cup unsweetened tea  Evening Snack 1 extra-small banana (1 carbohydrate serving)  Copyright 2020  [employer]. All rights reserved.

## 2019-04-12 NOTE — Telephone Encounter (Signed)
Called to speak  To patient. Not available per mother. Patient in the hospital.

## 2019-04-12 NOTE — Progress Notes (Signed)
PROGRESS NOTE  Benjamin [name] ZOX:[id number] DOB: 1965/11/23 DOA: 04/11/2019 PCP: Claiborne RiggFleming, [name] W, NP  HPI/Recap of past 24 hours: Marisa Severinimothy Gentry  is a 53 y.o. male, with medical history significant ofdCHF,hypertension, hyperlipidemia, diabetes mellitus, GERD, atrial fibrillationandPE on Xarelto,OSA on CPAP, iron deficiency anemia, morbid obesity, chronic elevation of troponin , as well recent COVID-19 infection. Patient presents secondary to worsening lower extremity edema and scrotal edema, she had recent ED visit for the same, patient report he has been compliant with his medications, including Lasix 80 mg 3 times a day, reports his legs feel very heavy, and uncomfortable, as well as scrotum and significantly swollen as well limiting his movement, upon further questioning, patient report he is compliant with fluid restriction, salt restriction, reports mildly worsening dyspnea, mainly upon laying supine, denies any chest pain, cough, fever, chills, nausea, vomiting, hemoptysis, diarrhea, or blood per rectum. - in ED no significant labs abnormalities, oxygen requirement at baseline, but patient was noted to have significantly swollen scrotum and +2 lower extremity edema, received 80 mg IV Lasix as I was called to admit for volume overload.  04/12/19: Patient was seen and examined at his bedside.  He reports bilateral lower extremity pain with swelling and groin pain.  Denies chest pain or dyspnea or cough.  Noted prior positive COVID-19 on 03/11/2019, repeated COVID-19 test negative on 04/11/2019.  Independently reviewed chest x-ray done on admission which showed markings similar to prior chest x-ray done on 04/01/19, improved from cxr done on 03/11/19, markings more prominent at bases. Patient placed on airborne precautions and repeated send out covid 19 test ordered as PUI. Transfer is in place to 2W as PUI if bed becomes available.  Assessment/Plan: Active Problems:   PE (pulmonary  thromboembolism) (HCC)   Diabetes mellitus type 2 in obese (HCC)   Bilateral lower extremity edema   Obesity, Class III, BMI 40-49.9 (morbid obesity) (HCC)   Chronic diastolic CHF (congestive heart failure) (HCC)   PAF (paroxysmal atrial fibrillation) (HCC)   COVID-19 virus infection   OSA on CPAP   HLD (hyperlipidemia)   GERD (gastroesophageal reflux disease)   Acute on chronic diastolic CHF (congestive heart failure) (HCC)   Severe Anasarca/fluid overload possibly secondary to acute on chronic diastolic CHF  Presented with significant volume overload with 3+ pitting edema in bilateral lower extremities, significant scrotal edema Renal function at baseline with GFR greater than 60 No recent LFTs Last CMP 03/12/2019 with normal LFTs. Started on Lasix drip Continue strict I's and O's and daily weight Net I&O -2.2 L since admission  Scrotal edema with tenderness likely secondary to fluid overload If no improvement of scrotal edema with Lasix drip we will consult urology Afebrile with no leukocytosis We will obtain scrotal ultrasound to rule out active infective process  Possible acute on chronic diastolic CHF BNP not elevated at 25 Last BNP 34 from 04/01/2019 Continue strict I's and O's and daily weight Continue Lasix drip Monitor renal function on Lasix drip  Acute on chronic hypoxic respiratory failure likely multifactorial in the setting of fluid overload  On oxygen supplementation 2L at baseline Currently requiring 4 L nasal cannula to maintain O2 saturation greater than 92% Start bronchodilators  Recent COVID-19 infection First dx positive covid-19 in 02/06/19, [facility] and discharged after 6 days on O2, recommended cardiology follow up. Afebrile Denies chest pain or dyspnea or cough.  Noted prior positive COVID-19 on 03/11/2019, repeated COVID-19 test negative on 04/11/2019.  Independently reviewed chest x-ray  done on admission which showed markings similar to prior  chest x-ray done on 04/01/19, improved from cxr done on 03/11/19, markings more prominent at bases. Patient placed on airborne precautions and repeated send out covid 19 test ordered as PUI. Transfer is in place to 2W as PUI if bed becomes available. Will get inflammatory markers once CRP, fibrinogen, LDH, D-dimer, LFTs.   Uncontrolled hypertension She is not at goal Continue home antihypertensives Titrate dose if blood pressure remains persistently elevated Continue to monitor vital signs Continue IV hydralazine PRN for SBP greater than 160  Severe Morbid obesity BMI 42 Recommend weight loss outpatient with regular physical activity and healthy diet once stable  CKD 2 Patient appears to be close to baseline 1.0 with GFR greater than 60 Creatinine today 1.24 with GFR greater than 60 Avoid nephrotoxins Monitor urine output Closely monitor renal function on Lasix drip  Type 2 diabetes with hyperglycemia Last hemoglobin A1c 7.6 on 01/23/2019 Continue insulin coverage  History of PE and DVT Continue Eliquis Maintain O2 saturation greater than 92%  OSA Continue CPAP at night    Risks: Patient is high right risk for decompensation due to severe anasarca, acute on chronic hypoxic respiratory failure, multiple comorbidities and advanced age.  Patient will require at least 2 midnights for further evaluation and treatment of present condition.  Code Status: Full code   Family Communication: We will call family if okay with the patient.  Disposition Plan: Home possibly in 2 to 3 days when volume status has improved.   Consultants:  None  Procedures:  None   Antimicrobials:  None  DVT prophylaxis:  Xarelto   Objective: Vitals:   04/12/19 0250 04/12/19 0503 04/12/19 0514 04/12/19 0751  BP: (!) 145/86 (!) 162/92  (!) 152/97  Pulse:  60  91  Resp:  18  20  Temp:  98.8 F (37.1 C)  97.8 F (36.6 C)  TempSrc:  Oral  Oral  SpO2:  96%  93%  Weight:   (!) 188.6 kg    Height:        Intake/Output Summary (Last 24 hours) at 04/12/2019 0946 Last data filed at 04/12/2019 0747 Gross per 24 hour  Intake 603 ml  Output 2900 ml  Net -2297 ml   Filed Weights   04/11/19 1440 04/11/19 2225 04/12/19 0514  Weight: (!) 178.8 kg (!) 190.2 kg (!) 188.6 kg    Exam:  . General: 53 y.o. year-old male well developed well nourished in no acute distress.  Alert and oriented x3. . Cardiovascular: Regular rate and rhythm with no rubs or gallops.  No thyromegaly or JVD noted.   Marland Kitchen Respiratory: Mild rales at bases with no wheezes noted.  Good inspiratory effort. . Abdomen: Obese distended with hypoactive bowel sounds.  Nontender.  Severe scrotal edema. . Musculoskeletal: 3+ pitting edema in lower extremities bilaterally up to the thighs. . Psychiatry: Mood is appropriate for condition and setting   Data Reviewed: CBC: Recent Labs  Lab 04/11/19 1338  WBC 8.2  NEUTROABS 4.4  HGB 11.0*  HCT 37.2*  MCV 81.2  PLT 093   Basic Metabolic Panel: Recent Labs  Lab 04/11/19 1338 04/12/19 0645  NA 138 137  K 4.0 4.0  CL 100 97*  CO2 31 28  GLUCOSE 104* 179*  BUN 10 11  CREATININE 1.05 1.24  CALCIUM 9.1 9.4   GFR: Estimated Creatinine Clearance: 117.5 mL/min (by C-G formula based on SCr of 1.24 mg/dL). Liver Function Tests: No results  for input(s): AST, ALT, ALKPHOS, BILITOT, PROT, ALBUMIN in the last 168 hours. No results for input(s): LIPASE, AMYLASE in the last 168 hours. No results for input(s): AMMONIA in the last 168 hours. Coagulation Profile: No results for input(s): INR, PROTIME in the last 168 hours. Cardiac Enzymes: No results for input(s): CKTOTAL, CKMB, CKMBINDEX, TROPONINI in the last 168 hours. BNP (last 3 results) No results for input(s): PROBNP in the last 8760 hours. HbA1C: No results for input(s): HGBA1C in the last 72 hours. CBG: Recent Labs  Lab 04/11/19 2101 04/11/19 2254 04/12/19 0557  GLUCAP 142* 118* 145*   Lipid Profile:  No results for input(s): CHOL, HDL, LDLCALC, TRIG, CHOLHDL, LDLDIRECT in the last 72 hours. Thyroid Function Tests: No results for input(s): TSH, T4TOTAL, FREET4, T3FREE, THYROIDAB in the last 72 hours. Anemia Panel: No results for input(s): VITAMINB12, FOLATE, FERRITIN, TIBC, IRON, RETICCTPCT in the last 72 hours. Urine analysis:    Component Value Date/Time   COLORURINE YELLOW 02/07/2019 1042   APPEARANCEUR HAZY (A) 02/07/2019 1042   LABSPEC 1.043 (H) 02/07/2019 1042   PHURINE 5.0 02/07/2019 1042   GLUCOSEU NEGATIVE 02/07/2019 1042   HGBUR NEGATIVE 02/07/2019 1042   BILIRUBINUR NEGATIVE 02/07/2019 1042   KETONESUR NEGATIVE 02/07/2019 1042   PROTEINUR 30 (A) 02/07/2019 1042   NITRITE NEGATIVE 02/07/2019 1042   LEUKOCYTESUR NEGATIVE 02/07/2019 1042   Sepsis Labs: @LABRCNTIP (procalcitonin:4,lacticidven:4)  ) Recent Results (from the past 240 hour(s))  SARS Coronavirus 2 (CEPHEID - Performed in [facility]), Hosp Order     Status: None   Collection Time: 04/11/19  6:54 PM   Specimen: Nasopharyngeal Swab  Result Value Ref Range Status   SARS Coronavirus 2 NEGATIVE NEGATIVE Final    Comment: (NOTE) If result is NEGATIVE SARS-CoV-2 target nucleic acids are NOT DETECTED. The SARS-CoV-2 RNA is generally detectable in upper and lower  respiratory specimens during the acute phase of infection. The lowest  concentration of SARS-CoV-2 viral copies this assay can detect is 250  copies / mL. A negative result does not preclude SARS-CoV-2 infection  and should not be used as the sole basis for treatment or other  patient management decisions.  A negative result may occur with  improper specimen collection / handling, submission of specimen other  than nasopharyngeal swab, presence of viral mutation(s) within the  areas targeted by this assay, and inadequate number of viral copies  (<250 copies / mL). A negative result must be combined with clinical  observations, patient  history, and epidemiological information. If result is POSITIVE SARS-CoV-2 target nucleic acids are DETECTED. The SARS-CoV-2 RNA is generally detectable in upper and lower  respiratory specimens dur ing the acute phase of infection.  Positive  results are indicative of active infection with SARS-CoV-2.  Clinical  correlation with patient history and other diagnostic information is  necessary to determine patient infection status.  Positive results do  not rule out bacterial infection or co-infection with other viruses. If result is PRESUMPTIVE POSTIVE SARS-CoV-2 nucleic acids MAY BE PRESENT.   A presumptive positive result was obtained on the submitted specimen  and confirmed on repeat testing.  While 2019 novel coronavirus  (SARS-CoV-2) nucleic acids may be present in the submitted sample  additional confirmatory testing may be necessary for epidemiological  and / or clinical management purposes  to differentiate between  SARS-CoV-2 and other Sarbecovirus currently known to infect humans.  If clinically indicated additional testing with an alternate test  methodology [phone] is advised. The  SARS-CoV-2 RNA is generally  detectable in upper and lower respiratory sp ecimens during the acute  phase of infection. The expected result is Negative. Fact Sheet for Patients:  BoilerBrush.com.cy Fact Sheet for Healthcare Providers: https://pope.com/ This test is not yet approved or cleared by the [place] FDA and has been authorized for detection and/or diagnosis of SARS-CoV-2 by FDA under an Emergency Use Authorization (EUA).  This EUA will remain in effect (meaning this test can be used) for the duration of the COVID-19 declaration under Section 564(b)(1) of the Act, 21 U.S.C. section 360bbb-3(b)(1), unless the authorization is terminated or revoked sooner. Performed at [facility], [address], [place], [place] [address]        Studies: Dg Chest Portable 1 View  Result Date: 04/11/2019 CLINICAL DATA:  Fluid retention and shortness of breath. Recent positive COVID-19 test on 03/11/2019. EXAM: PORTABLE CHEST 1 VIEW COMPARISON:  04/01/2019 FINDINGS: Cardiac silhouette is mildly enlarged. No mediastinal or hilar masses. Lungs demonstrate prominent bronchovascular markings similar to prior exams. No lung consolidation. No pleural effusion or pneumothorax. Skeletal structures are grossly intact. IMPRESSION: No acute cardiopulmonary disease. Electronically Signed   By: Amie Portland M.D.   On: 04/11/2019 14:54    Scheduled Meds: . atorvastatin  20 mg Oral QHS  . docusate sodium  100 mg Oral BID  . ferrous sulfate  325 mg Oral Daily  . insulin aspart  0-15 Units Subcutaneous TID WC  . insulin aspart  0-5 Units Subcutaneous QHS  . labetalol  200 mg Oral q morning - 10a  . lisinopril  40 mg Oral q morning - 10a  . pantoprazole  40 mg Oral Daily  . polyvinyl alcohol  1 drop Both Eyes Daily  . rivaroxaban  20 mg Oral Daily  . sodium chloride flush  3 mL Intravenous Q12H  . spironolactone  50 mg Oral q morning - 10a    Continuous Infusions: . sodium chloride    . furosemide (LASIX) infusion 10 mg/hr (04/11/19 1922)     LOS: 1 day     Darlin Drop, MD [facility] Pager [phone]  If 7PM-7AM, please contact night-coverage [email or web address] Password [facility] 04/12/2019, 9:46 AM

## 2019-04-12 NOTE — Progress Notes (Signed)
Came to room for inhaler, inhalers have not arrived to floor.  Per RN, this pt is transferring to 2W soon.

## 2019-04-12 NOTE — Telephone Encounter (Signed)
Pt contacted the office and stated he is in the hospital for the his swelling. Pt states the nurse there told him that he is positive for COVID-19 and they are going to send him to [facility]. Pt states that the nurse doesn't know what their taking about. Pt states he wants to speak with nurse or pcp regarding matter. Pt states he was positive for the COVID-19 last month. Pt states he may just go home.  Please f/u

## 2019-04-13 ENCOUNTER — Encounter (HOSPITAL_COMMUNITY): Payer: Self-pay

## 2019-04-13 DIAGNOSIS — I5033 Acute on chronic diastolic (congestive) heart failure: Secondary | ICD-10-CM

## 2019-04-13 DIAGNOSIS — I10 Essential (primary) hypertension: Secondary | ICD-10-CM

## 2019-04-13 DIAGNOSIS — R601 Generalized edema: Secondary | ICD-10-CM

## 2019-04-13 DIAGNOSIS — I5032 Chronic diastolic (congestive) heart failure: Secondary | ICD-10-CM

## 2019-04-13 DIAGNOSIS — E876 Hypokalemia: Secondary | ICD-10-CM

## 2019-04-13 LAB — URINALYSIS, ROUTINE W REFLEX MICROSCOPIC
Bilirubin Urine: NEGATIVE
Glucose, UA: NEGATIVE mg/dL
Hgb urine dipstick: NEGATIVE
Ketones, ur: NEGATIVE mg/dL
Leukocytes,Ua: NEGATIVE
Nitrite: NEGATIVE
Protein, ur: NEGATIVE mg/dL
Specific Gravity, Urine: 1.009 (ref 1.005–1.030)
pH: 5 (ref 5.0–8.0)

## 2019-04-13 LAB — GLUCOSE, CAPILLARY
Glucose-Capillary: 107 mg/dL — ABNORMAL HIGH (ref 70–99)
Glucose-Capillary: 106 mg/dL — ABNORMAL HIGH (ref 70–99)
Glucose-Capillary: 200 mg/dL — ABNORMAL HIGH (ref 70–99)
Glucose-Capillary: 146 mg/dL — ABNORMAL HIGH (ref 70–99)

## 2019-04-13 LAB — NOVEL CORONAVIRUS, NAA (HOSP ORDER, SEND-OUT TO REF LAB; TAT 18-24 HRS): SARS-CoV-2, NAA: NOT DETECTED

## 2019-04-13 LAB — BASIC METABOLIC PANEL
Anion gap: 10 (ref 5–15)
BUN: 16 mg/dL (ref 6–20)
CO2: 30 mmol/L (ref 22–32)
Calcium: 8.8 mg/dL — ABNORMAL LOW (ref 8.9–10.3)
Chloride: 97 mmol/L — ABNORMAL LOW (ref 98–111)
Creatinine, Ser: 1.3 mg/dL — ABNORMAL HIGH (ref 0.61–1.24)
GFR calc Af Amer: >60 mL/min (ref >60)
GFR calc non Af Amer: >60 mL/min (ref >60)
Glucose, Bld: 137 mg/dL — ABNORMAL HIGH (ref 70–99)
Potassium: 3.7 mmol/L (ref 3.5–5.1)
Sodium: 137 mmol/L (ref 135–145)

## 2019-04-13 LAB — MAGNESIUM: Magnesium: 1.9 mg/dL (ref 1.7–2.4)

## 2019-04-13 MED ORDER — POTASSIUM CHLORIDE CRYS ER 20 MEQ PO TBCR
40.0000 meq | EXTENDED_RELEASE_TABLET | Freq: Every day | ORAL | Status: DC
  Administered 2019-04-13 – 2019-04-16 (×4): 40 meq via ORAL
  Filled 2019-04-13 (×4): qty 2

## 2019-04-13 NOTE — Progress Notes (Signed)
PROGRESS NOTE                                                                                                                                                                                                             Patient Demographics:    Benjamin [name], is a 53 y.o. male, DOB - 1966-01-17, ZOX:[id number]  Admit date - 04/11/2019   Admitting Physician Starleen Armsawood S Elgergawy, MD  Outpatient Primary MD for the patient is Claiborne RiggFleming, [name] W, NP  LOS - 2  Outpatient Specialists: None  Chief Complaint  Patient presents with  . Leg Swelling  . Groin Swelling       Brief Narrative 53 year old morbidly obese male with history of diastolic CHF which is managed by his PCP only, hypertension, diabetes mellitus, GERD, A. fib and history of PE in April 2020 on Xarelto, OSA on CPAP, and deficiency anemia, GERD, chronic elevation of troponin and hospitalization for COVID-19 infection in May 2020 is admitted with increasing lower extremity edema and scrotal swelling for past several days. Patient reports being compliant with his fluid and salt restriction and diuretics which includes Lasix 80 mg 3 times a day.  However he has been uncomfortable with leg heaviness and scrotal swelling limiting his movement.  Reports worsening dyspnea on lying flat.  Denied any chest pain, palpitations, fevers, chills, nausea, vomiting, abdominal pain, bowel or urinary symptoms. Patient admitted to telemetry and started on Lasix drip for anasarca with acute on chronic diastolic CHF.  COVID-19 was tested negative in the ED.    Subjective:   Patient reports his breathing to be slightly improved since admission.  Has put out 4.2 L in past 24 hours and negative balance of 5.5 L since admission.  Patient frustrated that he is in an isolation room being tested for COVID-19 again.    Assessment  & Plan :   Principal problem   Acute on chronic diastolic CHF  (congestive heart failure) (HCC) Has anasarca.  On Lasix drip with excellent diuresis (4.2 L in past 24 hours).  Weight appears to be same as his previous admission 1 month back and does not seem to be accurate. Strict I's/O and daily weight.  Continue labetalol, lisinopril and statin.  Add daily potassium supplement. Patient does not see a cardiologist and will obtain inpatient cardiology evaluation and recommendations with plan  on outpatient follow-up.    Active Problems: Acute kidney injury (HCC) Mildly elevated creatinine of 1.3.  Will monitor closely while on IV Lasix drip.  Hypokalemia Add daily potassium supplement.  Check mag    PE (pulmonary thromboembolism) (HCC) Was diagnosed in April 2020.  He is on Xarelto for A. fib as well.  No issues currently.    Diabetes mellitus type 2 in obese (HCC) CBG currently stable.  Monitor on sliding scale coverage.  A1c of 7.6.  On metformin which is held    Obesity, Class III, BMI 40-49.9 (morbid obesity) (HCC) Needs counseling on diet and exercise to lose weight.    History of COVID-19 infection Was hospitalized in May.  He was tested negative during hospitalization in first week of June.  He then presented 2 weeks later with shortness of breath and hospitalized overnight which was due to CHF exacerbation.  He was tested positive at that time but clearly thought to be due to viral sloughing from previous infection. Patient tested negative for SARS COVID-19 in the ED.  He does not have any other respiratory symptoms associated with COVID infection and his current symptoms are clearly due to CHF and anasarca.  He does not need to be repeatedly tested and placed on isolation. I discussed this with ID Dr. Lucianne MussKumar who agrees with the plan.    PAF (paroxysmal atrial fibrillation) (HCC) Rate controlled.  Continue labetalol and Xarelto.     OSA on CPAP Nighttime CPAP      Code Status : Full code  Family Communication  : None at bedside   Disposition Plan  : Home once symptoms improved and diuresed adequately  Barriers For Discharge : Active symptoms  Consults  : Cardiology  Procedures  : None  DVT Prophylaxis  : Xarelto  Lab Results  Component Value Date   PLT 400 04/11/2019    Antibiotics  :    Anti-infectives (From admission, onward)   None        Objective:   Vitals:   04/12/19 1754 04/12/19 1855 04/12/19 2042 04/13/19 0835  BP: (!) 141/97 (!) 144/75 112/86 (!) 160/96  Pulse: 94 89 91 90  Resp: 18 19 20 20   Temp: 98.8 F (37.1 C) 98.2 F (36.8 C) 97.6 F (36.4 C) 98 F (36.7 C)  TempSrc: Oral Oral Oral Oral  SpO2: 93% 94% 94% 97%  Weight:      Height:        Wt Readings from Last 3 Encounters:  04/12/19 (!) 188.6 kg  04/01/19 (!) 190.5 kg  03/11/19 (!) 188.8 kg     Intake/Output Summary (Last 24 hours) at 04/13/2019 1153 Last data filed at 04/13/2019 0900 Gross per 24 hour  Intake 571 ml  Output 4000 ml  Net -3429 ml     Physical Exam  Gen: Morbidly obese male not in distress HEENT: no pallor, moist mucosa, supple neck Chest: Diminished bibasilar breath sound CVS: N S1&S2, no murmurs, rubs or gallop GI: soft, nondistended, nontender, mild pitting abdominal edema, significant scrotal edema Musculoskeletal: 2+ pitting edema over bilateral lower extremities     Data Review:    CBC Recent Labs  Lab 04/11/19 1338  WBC 8.2  HGB 11.0*  HCT 37.2*  PLT 400  MCV 81.2  MCH 24.0*  MCHC 29.6*  RDW 16.3*  LYMPHSABS 2.3  MONOABS 0.9  EOSABS 0.6*  BASOSABS 0.0    Chemistries  Recent Labs  Lab 04/11/19 1338 04/12/19 0645 04/12/19 1138 04/13/19  0654  NA 138 137  --  137  K 4.0 4.0  --  3.7  CL 100 97*  --  97*  CO2 31 28  --  30  GLUCOSE 104* 179*  --  137*  BUN 10 11  --  16  CREATININE 1.05 1.24  --  1.30*  CALCIUM 9.1 9.4  --  8.8*  AST  --   --  19  --   ALT  --   --  14  --   ALKPHOS  --   --  51  --   BILITOT  --   --  0.2*  --     ------------------------------------------------------------------------------------------------------------------ No results for input(s): CHOL, HDL, LDLCALC, TRIG, CHOLHDL, LDLDIRECT in the last 72 hours.  Lab Results  Component Value Date   HGBA1C 7.6 (H) 01/23/2019   ------------------------------------------------------------------------------------------------------------------ No results for input(s): TSH, T4TOTAL, T3FREE, THYROIDAB in the last 72 hours.  Invalid input(s): FREET3 ------------------------------------------------------------------------------------------------------------------ No results for input(s): VITAMINB12, FOLATE, FERRITIN, TIBC, IRON, RETICCTPCT in the last 72 hours.  Coagulation profile No results for input(s): INR, PROTIME in the last 168 hours.  Recent Labs    04/12/19 1138  DDIMER 0.37    Cardiac Enzymes No results for input(s): CKMB, TROPONINI, MYOGLOBIN in the last 168 hours.  Invalid input(s): CK ------------------------------------------------------------------------------------------------------------------    Component Value Date/Time   BNP 25.5 04/11/2019 1610    Inpatient Medications  Scheduled Meds: . atorvastatin  20 mg Oral QHS  . docusate sodium  100 mg Oral BID  . ferrous sulfate  325 mg Oral Daily  . insulin aspart  0-15 Units Subcutaneous TID WC  . insulin aspart  0-5 Units Subcutaneous QHS  . ipratropium  2 puff Inhalation Q8H  . labetalol  200 mg Oral q morning - 10a  . levalbuterol  2 puff Inhalation Q8H  . lisinopril  40 mg Oral q morning - 10a  . pantoprazole  40 mg Oral Daily  . polyvinyl alcohol  2 drop Both Eyes Daily  . rivaroxaban  20 mg Oral Daily  . sodium chloride flush  3 mL Intravenous Q12H  . spironolactone  50 mg Oral q morning - 10a   Continuous Infusions: . sodium chloride    . furosemide (LASIX) infusion 10 mg/hr (04/11/19 1922)   PRN Meds:.sodium chloride, acetaminophen, hydrALAZINE,  methocarbamol, ondansetron (ZOFRAN) IV, polyethylene glycol, sodium chloride flush  Micro Results Recent Results (from the past 240 hour(s))  SARS Coronavirus 2 (CEPHEID - Performed in [facility] hospital lab), Hosp Order     Status: None   Collection Time: 04/11/19  6:54 PM   Specimen: Nasopharyngeal Swab  Result Value Ref Range Status   SARS Coronavirus 2 NEGATIVE NEGATIVE Final    Comment: (NOTE) If result is NEGATIVE SARS-CoV-2 target nucleic acids are NOT DETECTED. The SARS-CoV-2 RNA is generally detectable in upper and lower  respiratory specimens during the acute phase of infection. The lowest  concentration of SARS-CoV-2 viral copies this assay can detect is 250  copies / mL. A negative result does not preclude SARS-CoV-2 infection  and should not be used as the sole basis for treatment or other  patient management decisions.  A negative result may occur with  improper specimen collection / handling, submission of specimen other  than nasopharyngeal swab, presence of viral mutation(s) within the  areas targeted by this assay, and inadequate number of viral copies  (<250 copies / mL). A negative result must be combined with clinical  observations, patient history, and epidemiological information. If result is POSITIVE SARS-CoV-2 target nucleic acids are DETECTED. The SARS-CoV-2 RNA is generally detectable in upper and lower  respiratory specimens dur ing the acute phase of infection.  Positive  results are indicative of active infection with SARS-CoV-2.  Clinical  correlation with patient history and other diagnostic information is  necessary to determine patient infection status.  Positive results do  not rule out bacterial infection or co-infection with other viruses. If result is PRESUMPTIVE POSTIVE SARS-CoV-2 nucleic acids MAY BE PRESENT.   A presumptive positive result was obtained on the submitted specimen  and confirmed on repeat testing.  While 2019 novel  coronavirus  (SARS-CoV-2) nucleic acids may be present in the submitted sample  additional confirmatory testing may be necessary for epidemiological  and / or clinical management purposes  to differentiate between  SARS-CoV-2 and other Sarbecovirus currently known to infect humans.  If clinically indicated additional testing with an alternate test  methodology [phone]([id number]) is advised. The SARS-CoV-2 RNA is generally  detectable in upper and lower respiratory sp ecimens during the acute  phase of infection. The expected result is Negative. Fact Sheet for Patients:  BoilerBrush.com.cyhttps://www.fda.gov/media/136312/download Fact Sheet for Healthcare Providers: https://pope.com/https://www.fda.gov/media/136313/download This test is not yet approved or cleared by the [place] FDA and has been authorized for detection and/or diagnosis of SARS-CoV-2 by FDA under an Emergency Use Authorization (EUA).  This EUA will remain in effect (meaning this test can be used) for the duration of the COVID-19 declaration under Section 564(b)(1) of the Act, 21 U.S.C. section 360bbb-3(b)(1), unless the authorization is terminated or revoked sooner. Performed at [facility], [address] [place], [place] [address]     Radiology Reports Dg Chest Portable 1 View  Result Date: 04/11/2019 CLINICAL DATA:  Fluid retention and shortness of breath. Recent positive COVID-19 test on 03/11/2019. EXAM: PORTABLE CHEST 1 VIEW COMPARISON:  04/01/2019 FINDINGS: Cardiac silhouette is mildly enlarged. No mediastinal or hilar masses. Lungs demonstrate prominent bronchovascular markings similar to prior exams. No lung consolidation. No pleural effusion or pneumothorax. Skeletal structures are grossly intact. IMPRESSION: No acute cardiopulmonary disease. Electronically Signed   By: Amie Portlandavid  Ormond M.D.   On: 04/11/2019 14:54   Dg Chest Portable 1 View  Result Date: 04/01/2019 CLINICAL DATA:  Acute onset shortness of breath and BILATERAL LOWER  extremity edema. COVID-19 positive on 03/11/2019. Former smoker. Current history of hypertension, diabetes, and sleep apnea. EXAM: PORTABLE CHEST 1 VIEW COMPARISON:  03/11/2019 and earlier. FINDINGS: Cardiac silhouette upper normal in size to slightly enlarged, unchanged. Lungs clear. Bronchovascular markings normal. Pulmonary vascularity normal. No visible pleural effusions. No pneumothorax. Stable mild elevation of the RIGHT hemidiaphragm. IMPRESSION: No acute cardiopulmonary disease. Electronically Signed   By: Hulan Saashomas  Lawrence M.D.   On: 04/01/2019 20:27   [place] Scrotum W/doppler  Result Date: 04/12/2019 CLINICAL DATA:  Edema EXAM: SCROTAL ULTRASOUND DOPPLER ULTRASOUND OF THE TESTICLES TECHNIQUE: Complete ultrasound examination of the testicles, epididymis, and other scrotal structures was performed. Color and spectral Doppler ultrasound were also utilized to evaluate blood flow to the testicles. COMPARISON:  None FINDINGS: Right testicle Measurements: 4.1 x 2.9 x 2.5 cm. Normal echogenicity without mass. Few scattered tiny microcalcifications, nonspecific. Blood flow present within RIGHT testis on color Doppler imaging. Left testicle Measurements: 4.0 x 2.6 x 3.1 cm. Normal echogenicity without mass or calcifications. Few tiny microcalcifications. Blood flow present within LEFT testis on color Doppler imaging, symmetric with RIGHT Right epididymis:  Tiny cyst  RIGHT epididymal head 5 mm diameter. Left epididymis: Large loculated cysts adjacent to the LEFT testis, favors spermatoceles versus epididymal cysts rather than loculated hydrocele. These measure up to 5.6 x 4.0 x 3.4 cm. Hydrocele:  BILATERAL hydroceles Varicocele:  None identified Pulsed Doppler interrogation of both testes demonstrates normal low resistance arterial and venous waveforms bilaterally. Examination limited secondary to body habitus. Mild scrotal wall edema noted. IMPRESSION: Normal appearing testes with small BILATERAL hydroceles.  Loculated cysts adjacent to LEFT testis question spermatoceles versus epididymal cysts largest 5.6 cm in greatest size. Electronically Signed   By: Lavonia Dana M.D.   On: 04/12/2019 18:53    Time Spent in minutes 35   Abbi Mancini M.D on 04/13/2019 at 11:53 AM  Between 7am to 7pm - Pager - [phone]  After 7pm go to [email or web address] - password [facility]  [facility] -  Office  [phone]

## 2019-04-13 NOTE — Consult Note (Signed)
Cardiology Consultation:   Patient ID: Benjamin [name] MRN: [id number]; DOB: [birth year]  Admit date: 04/11/2019 Date of Consult: 04/13/2019  Primary Care Provider: Claiborne RiggFleming, [name] W, NP Primary Cardiologist: New    Patient Profile:   Benjamin [name] is a 53 y.o. male with a hx of atrial fibrillation who is being seen today for the evaluation of edema  at the request of Dr Gonzella Lexhungel  History of Present Illness:   Benjamin [name] is a 53 yo with hx of diastolic CHF, HTN, DM, Atrial fib, PE in April 2020 (on Xarelto), OSA (on CPAP) GERD,chronic elevation of troponin    The pt was hospitalized for COVID 19 infection in May 2020  ' Pt presented with increased SOB and LE edema, scrotal swelling 2 days ago    Claims compliant with fluid and sal and diuretics (lasix 80 tid) Since admit pt started on lasix gtt and swellin is improving, but still has a long ways to go He uses oxygen at home with activity and at night   Heart Pathway Score:     Past Medical History:  Diagnosis Date   A-fib (HCC)    Diabetes mellitus without complication (HCC)    Elevated troponin 02/06/2019   Hypertension    Obesity    Pulmonary embolism (HCC)    Sleep apnea     Past Surgical History:  Procedure Laterality Date   COLONOSCOPY WITH PROPOFOL Left 02/25/2019   Procedure: COLONOSCOPY WITH PROPOFOL;  Surgeon: Willis Modenautlaw, William, MD;  [facility];  Service: Endoscopy;  Laterality: Left;   ESOPHAGOGASTRODUODENOSCOPY (EGD) WITH PROPOFOL Left 02/25/2019   Procedure: ESOPHAGOGASTRODUODENOSCOPY (EGD) WITH PROPOFOL;  Surgeon: Willis Modenautlaw, William, MD;  [facility];  Service: Endoscopy;  Laterality: Left;   NO PAST SURGERIES         Inpatient Medications: Scheduled Meds:  atorvastatin  20 mg Oral QHS   docusate sodium  100 mg Oral BID   ferrous sulfate  325 mg Oral Daily   insulin aspart  0-15 Units Subcutaneous TID WC   insulin aspart  0-5 Units Subcutaneous QHS   ipratropium  2  puff Inhalation Q8H   labetalol  200 mg Oral q morning - 10a   levalbuterol  2 puff Inhalation Q8H   lisinopril  40 mg Oral q morning - 10a   pantoprazole  40 mg Oral Daily   polyvinyl alcohol  2 drop Both Eyes Daily   potassium chloride  40 mEq Oral Daily   rivaroxaban  20 mg Oral Daily   sodium chloride flush  3 mL Intravenous Q12H   spironolactone  50 mg Oral q morning - 10a   Continuous Infusions:  sodium chloride     furosemide (LASIX) infusion 10 mg/hr (04/13/19 1452)   PRN Meds: sodium chloride, acetaminophen, hydrALAZINE, methocarbamol, ondansetron (ZOFRAN) IV, polyethylene glycol, sodium chloride flush  Allergies:   No Known Allergies  Social History:   Social History   Socioeconomic History   Marital status: Married    Spouse name: Not on file   Number of children: Not on file   Years of education: Not on file   Highest education level: Not on file  Occupational History   Not on file  Social Needs   Financial resource strain: Not on file   Food insecurity    Worry: Not on file    Inability: Not on file   Transportation needs    Medical: Not on file    Non-medical: Not on file  Tobacco  Use   Smoking status: Former Smoker   Smokeless tobacco: Never Used  Substance and Sexual Activity   Alcohol use: Not Currently   Drug use: Not Currently   Sexual activity: Not Currently  Lifestyle   Physical activity    Days per week: Not on file    Minutes per session: Not on file   Stress: Not on file  Relationships   Social connections    Talks on phone: Not on file    Gets together: Not on file    Attends religious service: Not on file    Active member of club or organization: Not on file    Attends meetings of clubs or organizations: Not on file    Relationship status: Not on file   Intimate partner violence    Fear of current or ex partner: Not on file    Emotionally abused: Not on file    Physically abused: Not on file    Forced  sexual activity: Not on file  Other Topics Concern   Not on file  Social History Narrative   Not on file    Family History:    Family History  Problem Relation Age of Onset   Hypertension Mother    Diabetes Mother      ROS:  Please see the history of present illness.  All other ROS reviewed and negative.     Physical Exam/Data:   Vitals:   04/12/19 1855 04/12/19 2042 04/13/19 0835 04/13/19 1453  BP: (!) 144/75 112/86 (!) 160/96 (!) 116/101  Pulse: 89 91 90 91  Resp: 19 20 20 18   Temp: 98.2 F (36.8 C) 97.6 F (36.4 C) 98 F (36.7 C) 98.7 F (37.1 C)  TempSrc: Oral Oral Oral Oral  SpO2: 94% 94% 97% 97%  Weight:      Height:        Intake/Output Summary (Last 24 hours) at 04/13/2019 1500 Last data filed at 04/13/2019 1300 Gross per 24 hour  Intake 531 ml  Output 4400 ml  Net -3869 ml   Net neg 6.1 L   Last 3 Weights 04/12/2019 04/11/2019 04/11/2019  Weight (lbs) 415 lb 11.2 oz 419 lb 4.8 oz 394 lb 1.6 oz  Weight (kg) 188.56 kg 190.193 kg 178.763 kg     Body mass index is 57.98 kg/m.  General:  Morbidly obese 53 yo in NAD   HEENT: normal Lymph: no adenopathy Neck: JVP is milldy increse d Endocrine:  No thryomegaly Vascular: No carotid bruits; FA pulses 2+ bilaterally without bruits  Cardiac:  normal S1, S2; RRR; no murmur  Lungs:  clear to auscultation bilaterally, no wheezing, rhonchi or rales  Abd: soft, nontender, no hepatomegaly  Ext: no edema Musculoskeletal:  No deformities, BUE and BLE strength normal and equal Skin: warm and dry  Neuro:  CNs 2-12 intact, no focal abnormalities noted Psych:  Normal affect   EKG:  The EKG was personally reviewed and demonstrates:  SR 91 bpm  T wave inverson in the inferior and lateral leads   Telemetry:  Telemetry was personally reviewed and demonstrates:  SR   Relevant CV Studies:  Echo 01/22/19  IMPRESSIONS    1. The left ventricle has normal systolic function, with an ejection fraction of 60-65%. The  cavity size was normal. There is mildly increased left ventricular wall thickness. Left ventricular diastolic Doppler parameters are consistent with impaired  relaxation.  2. Left atrial size was not well visualized.  3. The aortic valve was  not well visualized Aortic valve regurgitation was not assessed by color flow Doppler.  4. Extremely limited; definity used; normal LV systolic function; mild diastolic dysfunction; right heart not well visualized.  FINDINGS  Left Ventricle: The left ventricle has normal systolic function, with an ejection fraction of 60-65%. The cavity size was normal. There is mildly increased left ventricular wall thickness. Left ventricular diastolic Doppler parameters are consistent  with impaired relaxation (grade I).    Right Ventricle: The right ventricle was not well visualized.  Left Atrium: Left atrial size was not well visualized.  Right Atrium: Right atrial size was not well visualized. Right atrial pressure is estimated at 10 mmHg.   Pericardium: There is no evidence of pericardial effusion.  Mitral Valve: Mitral valve regurgitation was not assessed by color flow Doppler.  Tricuspid Valve: The tricuspid valve was not well visualized.  Aortic Valve: The aortic valve was not well visualized Aortic valve regurgitation was not assessed by color flow Doppler.  Pulmonic Valve: The pulmonic valve was not well visualized. Pulmonic valve regurgitation was not assessed by color flow Doppler.  Venous: The inferior vena cava is normal in size with greater than 50% respiratory variability.    +--------------+-------++  LEFT VENTRICLE           +--------------+-------++  PLAX 2D                  +--------------+-------++  LVIDd:         3.80 cm   +--------------+-------++  LVIDs:         2.70 cm   +--------------+-------++  LV PW:         1.30 cm   +--------------+-------++  LV IVS:        1.30 cm   +--------------+-------++  LV SV:         35  ml     +--------------+-------++  LV SV Index:   10.90     +--------------+-------++                           +--------------+-------++   +------------------+---------++  LV Volumes (MOD)               +------------------+---------++  LV area d, A2C:    27.60 cm   +------------------+---------++  LV area d, A4C:    32.90 cm   +------------------+---------++  LV area s, A2C:    8.74 cm    +------------------+---------++  LV area s, A4C:    13.80 cm   +------------------+---------++  LV major d, A2C:   8.04 cm     +------------------+---------++  LV major d, A4C:   8.16 cm     +------------------+---------++  LV major s, A2C:   7.48 cm     +------------------+---------++  LV major s, A4C:   7.19 cm     +------------------+---------++  LV vol d, MOD A2C: 78.8 ml     +------------------+---------++  LV vol d, MOD A4C: 108.0 ml    +------------------+---------++  LV vol s, MOD A2C: 8.8 ml      +------------------+---------++  LV vol s, MOD A4C: 23.4 ml     +------------------+---------++  LV SV MOD A2C:     70.0 ml     +------------------+---------++  LV SV MOD A4C:     108.0 ml    +------------------+---------++  LV SV MOD BP:      77.9 ml     +------------------+---------++  +-----------+-------++----------++  LEFT ATRIUM  Index        +-----------+-------++----------++  LA diam:    3.50 cm  1.21 cm/m   +-----------+-------++----------++  +-------------+-----------++  AORTIC VALVE                +-------------+-----------++  AV Vmax:      143.00 cm/s   +-------------+-----------++  AV Peak Grad: 8.2 mmHg      +-------------+-----------++   +-------------+-------++  AORTA                   +-------------+-------++  Ao Root diam: 3.10 cm   +-------------+-------++  Ao Asc diam:  3.60 cm   +-------------+-------++  +--------------+----------++  MITRAL VALVE                +--------------+----------++  MV Area (PHT): 2.56 cm      +--------------+----------++  MV PHT:        85.84 msec   +--------------+----------++  MV Decel Time: 296 msec     +--------------+----------++ +--------------+----------++  MV E velocity: 66.60 cm/s   +--------------+----------++  MV A velocity: 98.10 cm/s   +--------------+----------++  MV E/A ratio:  0.68         +--------------+----------++    Kirk Ruths MD Electronically signed by Kirk Ruths MD Signature Date/Time: 01/22/2019/1:37:53 PM    Laboratory Data:  High Sensitivity Troponin:   Recent Labs  Lab 04/11/19 1338 04/11/19 1538  TROPONINIHS 14 16     Cardiac EnzymesNo results for input(s): TROPONINI in the last 168 hours. No results for input(s): TROPIPOC in the last 168 hours.  Chemistry Recent Labs  Lab 04/11/19 1338 04/12/19 0645 04/13/19 0654  NA 138 137 137  K 4.0 4.0 3.7  CL 100 97* 97*  CO2 31 28 30   GLUCOSE 104* 179* 137*  BUN 10 11 16   CREATININE 1.05 1.24 1.30*  CALCIUM 9.1 9.4 8.8*  GFRNONAA >60 >60 >60  GFRAA >60 >60 >60  ANIONGAP 7 12 10     Recent Labs  Lab 04/12/19 1138  PROT 7.2  ALBUMIN 3.5  AST 19  ALT 14  ALKPHOS 51  BILITOT 0.2*   Hematology Recent Labs  Lab 04/11/19 1338  WBC 8.2  RBC 4.58  HGB 11.0*  HCT 37.2*  MCV 81.2  MCH 24.0*  MCHC 29.6*  RDW 16.3*  PLT 400   BNP Recent Labs  Lab 04/11/19 1610  BNP 25.5    DDimer  Recent Labs  Lab 04/12/19 1138  DDIMER 0.37     Radiology/Studies:  Dg Chest Portable 1 View  Result Date: 04/11/2019 CLINICAL DATA:  Fluid retention and shortness of breath. Recent positive COVID-19 test on 03/11/2019. EXAM: PORTABLE CHEST 1 VIEW COMPARISON:  04/01/2019 FINDINGS: Cardiac silhouette is mildly enlarged. No mediastinal or hilar masses. Lungs demonstrate prominent bronchovascular markings similar to prior exams. No lung consolidation. No pleural effusion or pneumothorax. Skeletal structures are grossly intact. IMPRESSION: No acute cardiopulmonary disease.  Electronically Signed   By: Lajean Manes M.D.   On: 04/11/2019 14:54   US Scrotum W/doppler  Result Date: 04/12/2019 CLINICAL DATA:  Edema EXAM: SCROTAL ULTRASOUND DOPPLER ULTRASOUND OF THE TESTICLES TECHNIQUE: Complete ultrasound examination of the testicles, epididymis, and other scrotal structures was performed. Color and spectral Doppler ultrasound were also utilized to evaluate blood flow to the testicles. COMPARISON:  None FINDINGS: Right testicle Measurements: 4.1 x 2.9 x 2.5 cm. Normal echogenicity without mass. Few scattered tiny microcalcifications, nonspecific. Blood flow present within RIGHT testis on color Doppler imaging. Left testicle  Measurements: 4.0 x 2.6 x 3.1 cm. Normal echogenicity without mass or calcifications. Few tiny microcalcifications. Blood flow present within LEFT testis on color Doppler imaging, symmetric with RIGHT Right epididymis:  Tiny cyst RIGHT epididymal head 5 mm diameter. Left epididymis: Large loculated cysts adjacent to the LEFT testis, favors spermatoceles versus epididymal cysts rather than loculated hydrocele. These measure up to 5.6 x 4.0 x 3.4 cm. Hydrocele:  BILATERAL hydroceles Varicocele:  None identified Pulsed Doppler interrogation of both testes demonstrates normal low resistance arterial and venous waveforms bilaterally. Examination limited secondary to body habitus. Mild scrotal wall edema noted. IMPRESSION: Normal appearing testes with small BILATERAL hydroceles. Loculated cysts adjacent to LEFT testis question spermatoceles versus epididymal cysts largest 5.6 cm in greatest size. Electronically Signed   By: Ulyses SouthwardMark  Boles M.D.   On: 04/12/2019 18:53    Assessment and Plan:   Acute on chronic diastolic CHF   Pt remains significally volume overloaded  Keep up with diuresis   WIll have dietary come to educate re low Na diet  2  PAF  Keep on Xarelto  3  HTN   Watch BP with diuresis  May need to pull back on meds    4  Pulmonary   SIgnif restictions in  breathing due to weight and also with recent COVID infection   Keep with oxygne  5  LIpids   Keep on lipitor  6  DM  Pt reports his A1C is not that bad Low 6s  7  Morbid obesity   DIscussed wt loss once medica condition improves For questions or updates, please contact [facility] Please consult [email or web address] for contact info under     Signed, Dietrich PatesPaula Eleaner Dibartolo, MD  04/13/2019 3:00 PM

## 2019-04-13 NOTE — Progress Notes (Signed)
Report given to Holmen on 3E.

## 2019-04-13 NOTE — Consult Note (Signed)
I have spoken with pt's PCP, reviewed patient's chart. They have a clear alternative diagnosis and COVID precautions are being d/c.   Chris Narasimhan W Donathan Buller, MD 

## 2019-04-13 NOTE — Progress Notes (Signed)
Pt refusing to wear cpap for the night. RT will continue to monitor as needed. 

## 2019-04-13 NOTE — Progress Notes (Signed)
Pt oriented to the unit, pt has all belongings included call bell in reach  Pt vitals documented and set up on tele Pt denies pain at this time  Will continue to monitor

## 2019-04-14 LAB — BASIC METABOLIC PANEL
Anion gap: 10 (ref 5–15)
BUN: 17 mg/dL (ref 6–20)
CO2: 28 mmol/L (ref 22–32)
Calcium: 8.9 mg/dL (ref 8.9–10.3)
Chloride: 97 mmol/L — ABNORMAL LOW (ref 98–111)
Creatinine, Ser: 1.24 mg/dL (ref 0.61–1.24)
GFR calc Af Amer: >60 mL/min (ref >60)
GFR calc non Af Amer: >60 mL/min (ref >60)
Glucose, Bld: 114 mg/dL — ABNORMAL HIGH (ref 70–99)
Potassium: 4.2 mmol/L (ref 3.5–5.1)
Sodium: 135 mmol/L (ref 135–145)

## 2019-04-14 LAB — GLUCOSE, CAPILLARY
Glucose-Capillary: 135 mg/dL — ABNORMAL HIGH (ref 70–99)
Glucose-Capillary: 121 mg/dL — ABNORMAL HIGH (ref 70–99)
Glucose-Capillary: 110 mg/dL — ABNORMAL HIGH (ref 70–99)
Glucose-Capillary: 110 mg/dL — ABNORMAL HIGH (ref 70–99)

## 2019-04-14 MED ORDER — POLYVINYL ALCOHOL 1.4 % OP SOLN
2.0000 [drp] | Freq: Two times a day (BID) | OPHTHALMIC | Status: DC
  Administered 2019-04-14 – 2019-04-17 (×6): 2 [drp] via OPHTHALMIC

## 2019-04-14 MED ORDER — POLYVINYL ALCOHOL 1.4 % OP SOLN: 2.0000 [drp] | Freq: Two times a day (BID) | OPHTHALMIC | Status: DC

## 2019-04-14 MED ORDER — CYCLOBENZAPRINE HCL 5 MG PO TABS
5.0000 mg | ORAL_TABLET | Freq: Once | ORAL | Status: AC
  Administered 2019-04-14: 5 mg via ORAL
  Filled 2019-04-14: qty 1

## 2019-04-14 MED ORDER — METOLAZONE 2.5 MG PO TABS
2.5000 mg | ORAL_TABLET | Freq: Once | ORAL | Status: AC
  Administered 2019-04-14: 2.5 mg via ORAL
  Filled 2019-04-14: qty 1

## 2019-04-14 NOTE — Plan of Care (Signed)
  Problem: Pain Managment: Goal: General experience of comfort will improve Outcome: Progressing   

## 2019-04-14 NOTE — Progress Notes (Signed)
Upon responding to patient's request for nurse, during which he refused to state his need; pt was informed of multiple emergencies holding up staff and provided an apology for the delay.    Pt states "im having an emergency, I got cramps'.   Pt informed he could have his robaxin, but it would be just a few minutes before this nurse could return with it.   Patient provided a PRN robaxin for muscle cramping.

## 2019-04-14 NOTE — Progress Notes (Signed)
Progress Note  Patient Name: Benjamin [name] Date of Encounter: 04/14/2019  Primary Cardiologist: New  Subjective   Breathing is improving  No CP  Thighs not as tight  Inpatient Medications    Scheduled Meds:  atorvastatin  20 mg Oral QHS   docusate sodium  100 mg Oral BID   ferrous sulfate  325 mg Oral Daily   insulin aspart  0-15 Units Subcutaneous TID WC   insulin aspart  0-5 Units Subcutaneous QHS   ipratropium  2 puff Inhalation Q8H   labetalol  200 mg Oral q morning - 10a   levalbuterol  2 puff Inhalation Q8H   lisinopril  40 mg Oral q morning - 10a   pantoprazole  40 mg Oral Daily   polyvinyl alcohol  2 drop Both Eyes Daily   potassium chloride  40 mEq Oral Daily   rivaroxaban  20 mg Oral Daily   sodium chloride flush  3 mL Intravenous Q12H   spironolactone  50 mg Oral q morning - 10a   Continuous Infusions:  sodium chloride     furosemide (LASIX) infusion 10 mg/hr (04/13/19 1452)   PRN Meds: sodium chloride, acetaminophen, hydrALAZINE, methocarbamol, ondansetron (ZOFRAN) IV, polyethylene glycol, sodium chloride flush   Vital Signs    Vitals:   04/13/19 2101 04/13/19 2329 04/14/19 0612 04/14/19 0625  BP:  (!) 128/98 (!) 153/133   Pulse:  88 90   Resp:  18 18   Temp:  98.2 F (36.8 C) 98 F (36.7 C)   TempSrc:  Oral Oral   SpO2: 93% 92% 92% 94%  Weight:   (!) 188.3 kg   Height:        Intake/Output Summary (Last 24 hours) at 04/14/2019 0748 Last data filed at 04/14/2019 0500 Gross per 24 hour  Intake 1110.57 ml  Output 3900 ml  Net -2789.43 ml    Net Neg 7.94 L  Last 3 Weights 04/14/2019 04/13/2019 04/12/2019  Weight (lbs) 415 lb 3.2 oz 413 lb 9.6 oz 415 lb 11.2 oz  Weight (kg) 188.333 kg 187.608 kg 188.56 kg      Telemetry    SR - Personally Reviewed  ECG    NOt done  - Personally Reviewed  Physical Exam   GEN: Morbidly obese 53 yo in noacute distress.   Neck: Neck is full   Cardiac: RRR, no murmurs, rubs, or  gallops.  Respiratory: Clear to auscultation bilaterally. GI: Soft, nontender, non-distended  MS: Legs edematous    Thigh softer than yesterday   No deformity. Neuro:  Nonfocal  Psych: Normal affect   Labs    High Sensitivity Troponin:   Recent Labs  Lab 04/11/19 1338 04/11/19 1538  TROPONINIHS 14 16      Cardiac EnzymesNo results for input(s): TROPONINI in the last 168 hours. No results for input(s): TROPIPOC in the last 168 hours.   Chemistry Recent Labs  Lab 04/11/19 1338 04/12/19 0645 04/12/19 1138 04/13/19 0654  NA 138 137  --  137  K 4.0 4.0  --  3.7  CL 100 97*  --  97*  CO2 31 28  --  30  GLUCOSE 104* 179*  --  137*  BUN 10 11  --  16  CREATININE 1.05 1.24  --  1.30*  CALCIUM 9.1 9.4  --  8.8*  PROT  --   --  7.2  --   ALBUMIN  --   --  3.5  --   AST  --   --  19  --   ALT  --   --  14  --   ALKPHOS  --   --  51  --   BILITOT  --   --  0.2*  --   GFRNONAA >60 >60  --  >60  GFRAA >60 >60  --  >60  ANIONGAP 7 12  --  10     Hematology Recent Labs  Lab 04/11/19 1338  WBC 8.2  RBC 4.58  HGB 11.0*  HCT 37.2*  MCV 81.2  MCH 24.0*  MCHC 29.6*  RDW 16.3*  PLT 400    BNP Recent Labs  Lab 04/11/19 1610  BNP 25.5     DDimer  Recent Labs  Lab 04/12/19 1138  DDIMER 0.37     Radiology    US Scrotum W/doppler  Result Date: 04/12/2019 CLINICAL DATA:  Edema EXAM: SCROTAL ULTRASOUND DOPPLER ULTRASOUND OF THE TESTICLES TECHNIQUE: Complete ultrasound examination of the testicles, epididymis, and other scrotal structures was performed. Color and spectral Doppler ultrasound were also utilized to evaluate blood flow to the testicles. COMPARISON:  None FINDINGS: Right testicle Measurements: 4.1 x 2.9 x 2.5 cm. Normal echogenicity without mass. Few scattered tiny microcalcifications, nonspecific. Blood flow present within RIGHT testis on color Doppler imaging. Left testicle Measurements: 4.0 x 2.6 x 3.1 cm. Normal echogenicity without mass or  calcifications. Few tiny microcalcifications. Blood flow present within LEFT testis on color Doppler imaging, symmetric with RIGHT Right epididymis:  Tiny cyst RIGHT epididymal head 5 mm diameter. Left epididymis: Large loculated cysts adjacent to the LEFT testis, favors spermatoceles versus epididymal cysts rather than loculated hydrocele. These measure up to 5.6 x 4.0 x 3.4 cm. Hydrocele:  BILATERAL hydroceles Varicocele:  None identified Pulsed Doppler interrogation of both testes demonstrates normal low resistance arterial and venous waveforms bilaterally. Examination limited secondary to body habitus. Mild scrotal wall edema noted. IMPRESSION: Normal appearing testes with small BILATERAL hydroceles. Loculated cysts adjacent to LEFT testis question spermatoceles versus epididymal cysts largest 5.6 cm in greatest size. Electronically Signed   By: Lavonia Dana M.D.   On: 04/12/2019 18:53    Cardiac Studies     Patient Profile     53 y.o. male with hx of PAF, HTN, DM, recent COVID infection who presents with severe LE and scrotal edema  Assessment & Plan    1  Acute on chronic diastolic CHF  Pt continues to diurese Net neg 8 L now   WOuld continue  Can try 1 dose of Zaroxylyn 2.5 and see iff assists    Needs to watch salt in diet   Need to keep on CPAP   2  PAF   COntinue on Xareto  Rmains in SR    3  HTN  BP is labile  Follow   4  LIpids   On lipitor  5  Pulmonary   Recent COVID infection this spring  6  Morbid Obesity   Needs to lose wt  For questions or updates, please contact [facility] Please consult [email or web address] for contact info under        Signed, Dorris Carnes, MD  04/14/2019, 7:48 AM

## 2019-04-14 NOTE — Progress Notes (Signed)
Pt refusing CPAP for the night. RT will continue to monitor as needed.  

## 2019-04-14 NOTE — Progress Notes (Signed)
Patient states that he never stood on a scale this morning, therefore he isn't sure where the weight documented came from.   Pt was re-weighed today on the same scale previously recorded. See vitals for weight.

## 2019-04-14 NOTE — Telephone Encounter (Signed)
Agree with instructions given

## 2019-04-14 NOTE — Progress Notes (Signed)
Patient resting comfortably during shift report. Denies complaints.  

## 2019-04-14 NOTE — TOC Initial Note (Signed)
Transition of Care [facility]) - Initial/Assessment Note    Patient Details  Name: Benjamin [name] MRN: [id number] Date of Birth: 05-19-1966  Transition of Care [facility]) CM/SW Contact:    Benjamin Collet, RN Phone Number: 04/14/2019, 10:31 AM  Clinical Narrative:            Benjamin [name] w patient at bedside. He states he lives in a halfway house, he was assigned to upon his release from [facility] around January. He states that he goes to [facility] and gets his meds filled there. He states that he submits his medical bills to the judicial system and they pay for them while he is in the halfway house. He states his medications get mailed to him through [facility]. He has home oxygen and a CPAP machine. He will have transportation home.  No immediate CM needs identified.        Expected Discharge Plan: Home/Self Care Barriers to Discharge: Continued Medical Work up   Patient Goals and CMS Choice Patient states their goals for this hospitalization and ongoing recovery are:: to return home      Expected Discharge Plan and Services Expected Discharge Plan: Home/Self Care   Discharge Planning Services: CM Consult   Living arrangements for the past 2 months: [place]                                      Prior Living Arrangements/Services Living arrangements for the past 2 months: [place] with:: Self              Current home services: DME    Activities of Daily Living Home Assistive Devices/Equipment: None ADL Screening (condition at time of admission) Patient's cognitive ability adequate to safely complete daily activities?: Yes Is the patient deaf or have difficulty hearing?: No Does the patient have difficulty seeing, even when wearing glasses/contacts?: No Does the patient have difficulty concentrating, remembering, or making decisions?: No Patient able to express need for assistance with ADLs?: Yes Does the patient have difficulty dressing or bathing?:  No Independently performs ADLs?: Yes (appropriate for developmental age) Does the patient have difficulty walking or climbing stairs?: Yes Weakness of Legs: Both Weakness of Arms/Hands: None  Permission Sought/Granted                  Emotional Assessment              Admission diagnosis:  Edema Patient Active Problem List   Diagnosis Date Noted  . Acute on chronic diastolic CHF (congestive heart failure) ([place]) 04/11/2019  . Iron deficiency anemia 03/11/2019  . OSA on CPAP 03/11/2019  . HLD (hyperlipidemia) 03/11/2019  . Elevated troponin 03/11/2019  . GERD (gastroesophageal reflux disease) 03/11/2019  . Rectal bleeding 02/20/2019  . Dyspnea 02/06/2019  . COVID-19 virus infection 02/06/2019  . Bilateral lower extremity edema   . Obesity, Class III, BMI 40-49.9 (morbid obesity) ([place])   . Chronic diastolic CHF (congestive heart failure) ([place])   . PAF (paroxysmal atrial fibrillation) ([place])   . PE (pulmonary thromboembolism) ([place]) 01/21/2019  . Hypertensive urgency 01/21/2019  . Diabetes mellitus type 2 in obese ([place]) 01/21/2019   PCP:  Benjamin Pounds, NP Pharmacy:   [facility], [address] [place] [address] Phone: [phone] Fax: [phone]     Social Determinants of Health (SDOH) Interventions  Readmission Risk Interventions No flowsheet data found.

## 2019-04-14 NOTE — Progress Notes (Addendum)
PROGRESS NOTE  [name] LPF:[id number] DOB: [birth year] DOA: 04/11/2019 PCP: Claiborne Rigg, NP  HPI/Recap of past 24 hours: Benjamin [name]  is a 53 y.o. male, with medical history significant ofdCHF,hypertension, hyperlipidemia, diabetes mellitus, GERD, atrial fibrillationandPE on Xarelto,OSA on CPAP, iron deficiency anemia, morbid obesity, chronic elevation of troponin , as well recent COVID-19 infection. Patient presents secondary to worsening lower extremity edema and scrotal edema, she had recent ED visit for the same, patient report he has been compliant with his medications, including Lasix 80 mg 3 times a day, reports his legs feel very heavy, and uncomfortable, as well as scrotum and significantly swollen as well limiting his movement, upon further questioning, patient report he is compliant with fluid restriction, salt restriction, reports mildly worsening dyspnea, mainly upon laying supine, denies any chest pain, cough, fever, chills, nausea, vomiting, hemoptysis, diarrhea, or blood per rectum. - in ED no significant labs abnormalities, oxygen requirement at baseline, but patient was noted to have significantly swollen scrotum and +2 lower extremity edema, received 80 mg IV Lasix as I was called to admit for volume overload.  Started on lasix drip and ongoing use with improvement of volume status.  04/14/19: Patient was seen and examined at his bedside. States his breathing is improved with diuretics. Denies chest pain, cough or palpitations. Reports persistent but improved discomfort in his scrotum.  Assessment/Plan: Active Problems:   PE (pulmonary thromboembolism) (HCC)   Diabetes mellitus type 2 in obese (HCC)   Bilateral lower extremity edema   Obesity, Class III, BMI 40-49.9 (morbid obesity) (HCC)   Chronic diastolic CHF (congestive heart failure) (HCC)   PAF (paroxysmal atrial fibrillation) (HCC)   COVID-19 virus infection   OSA on CPAP   HLD  (hyperlipidemia)   GERD (gastroesophageal reflux disease)   Acute on chronic diastolic CHF (congestive heart failure) (HCC)   Severe Anasarca/fluid overload possibly secondary to acute on chronic diastolic CHF  Presented with significant volume overload with 3+ pitting edema in bilateral lower extremities, significant scrotal edema Renal function at baseline with GFR greater than 60 No recent LFTs Last CMP 03/12/2019 with normal LFTs. Started on Lasix drip, continue Continue strict I's and O's and daily weight Net I&O -2.2 L >> -7.L since admission Continues to improve but still significantly volume overload  Paroxysmal A. Fib Currently in sinus rhythm Rate controlled, on labetalol On Xarelto for CVA prevention and history of PE DVT  Scrotal edema/Large left spermatocele? with tenderness likely secondary to fluid overload If no improvement of scrotal edema with Lasix drip we will consult urology Afebrile with no leukocytosis Scrotal US revealed: Normal appearing testes with small BILATERAL hydroceles. Loculated cysts adjacent to LEFT testis question spermatoceles versus epididymal cysts largest 5.6 cm in greatest size. Curbsided with urology Dr.Wrenn who looked at the labs and [place]. We will continue to diurese, if his edema resolves and his cardiac risk is acceptable and still symptomatic he could be considered for surgical repair however he may not be a good candidate.  Acute on chronic diastolic CHF BNP 25. Last BNP 34 from 04/01/2019 Continue diuresing On lasix drip and 1 dose xaroxylyn Continue strict I's and O's and daily weight Continue Lasix drip Monitor renal function on Lasix drip  Acute on chronic hypoxic respiratory failure likely multifactorial in the setting of fluid overload, improving  On oxygen supplementation 2L at baseline Currently requiring 4 L nasal cannula to maintain O2 saturation greater than 92% Continue bronchodilators Home O2 eval when close  to dc   Recent COVID-19 infection First dx positive covid-19 in 02/06/19, [facility] and discharged after 6 days on O2, recommended cardiology follow up. Afebrile Denies chest pain or dyspnea or cough.  Noted prior positive COVID-19 on 03/11/2019, repeated COVID-19 test negative on 04/11/2019.  Independently reviewed chest x-ray done on admission which showed markings similar to prior chest x-ray done on 04/01/19, improved from cxr done on 03/11/19, markings more prominent at bases.  Inflammatory markers trended down Negative covid x 2 this admission including 1 negative send out test  Resolved Uncontrolled hypertension BP is at goal Continue antihypertensives Continue IV hydralazine PRN for SBP greater than 160  Severe Morbid obesity BMI 57 Recommend weight loss outpatient with regular physical activity and healthy diet once stable  CKD 2 Patient appears to be close to baseline 1.0 with GFR greater than 60 Creatinine 04/14/19 1.24 Continue to avoid nephrotoxins Monitor urine output Continue daily BMP  Type 2 diabetes with hyperglycemia Last hemoglobin A1c 7.6 on 01/23/2019 Continue insulin coverage  History of PE and DVT Continue xarelto Maintain O2 saturation greater than 92%  OSA Continue CPAP at night   Code Status: Full code   Family Communication: We will call family if okay with the patient.  Disposition Plan: Home possibly in 2 to 3 days when volume status is close to baseline and cardiology signs off.   Consultants:  Cardiology  Curbsided with urology, Dr. Annabell HowellsWrenn on 04/14/19. Not a formal consult.  Procedures:  None   Antimicrobials:  None  DVT prophylaxis:  Xarelto   Objective: Vitals:   04/14/19 0625 04/14/19 0740 04/14/19 1025 04/14/19 1053  BP:  120/71 123/85   Pulse:  88 90   Resp:      Temp:      TempSrc:      SpO2: 94%     Weight:    (!) 186.9 kg  Height:        Intake/Output Summary (Last 24 hours) at 04/14/2019 1222 Last data filed at  04/14/2019 1040 Gross per 24 hour  Intake 1589.06 ml  Output 3900 ml  Net -2310.94 ml   Filed Weights   04/13/19 1453 04/14/19 0612 04/14/19 1053  Weight: (!) 187.6 kg (!) 188.3 kg (!) 186.9 kg    Exam:  . General: 53 y.o. year-old male morbidly obese, in no acute distress.  Alert and oriented x3.   . Cardiovascular: Regular rate and rhythm no rubs or gallops.  No JVD or thyromegaly.   Marland Kitchen. Respiratory: Clear to auscultation no wheezes no rales.  Poor inspiratory effort.   . Abdomen: Morbidly obese nontender bowel sounds present. . Musculoskeletal: 2+ pitting edema in lower extremities improved from prior.  Marland Kitchen. Psychiatry: Mood is appropriate for condition and setting.  Data Reviewed: CBC: Recent Labs  Lab 04/11/19 1338  WBC 8.2  NEUTROABS 4.4  HGB 11.0*  HCT 37.2*  MCV 81.2  PLT 400   Basic Metabolic Panel: Recent Labs  Lab 04/11/19 1338 04/12/19 0645 04/13/19 0654 04/14/19 0722  NA 138 137 137 135  K 4.0 4.0 3.7 4.2  CL 100 97* 97* 97*  CO2 31 28 30 28   GLUCOSE 104* 179* 137* 114*  BUN 10 11 16 17   CREATININE 1.05 1.24 1.30* 1.24  CALCIUM 9.1 9.4 8.8* 8.9  MG  --   --  1.9  --    GFR: Estimated Creatinine Clearance: 116.8 mL/min (by C-G formula based on SCr of 1.24 mg/dL). Liver Function Tests: Recent  Labs  Lab 04/12/19 1138  AST 19  ALT 14  ALKPHOS 51  BILITOT 0.2*  PROT 7.2  ALBUMIN 3.5   No results for input(s): LIPASE, AMYLASE in the last 168 hours. No results for input(s): AMMONIA in the last 168 hours. Coagulation Profile: No results for input(s): INR, PROTIME in the last 168 hours. Cardiac Enzymes: No results for input(s): CKTOTAL, CKMB, CKMBINDEX, TROPONINI in the last 168 hours. BNP (last 3 results) No results for input(s): PROBNP in the last 8760 hours. HbA1C: No results for input(s): HGBA1C in the last 72 hours. CBG: Recent Labs  Lab 04/13/19 0831 04/13/19 1255 04/13/19 1653 04/13/19 2117 04/14/19 0639  GLUCAP 107* 106* 200* 146*  135*   Lipid Profile: No results for input(s): CHOL, HDL, LDLCALC, TRIG, CHOLHDL, LDLDIRECT in the last 72 hours. Thyroid Function Tests: No results for input(s): TSH, T4TOTAL, FREET4, T3FREE, THYROIDAB in the last 72 hours. Anemia Panel: No results for input(s): VITAMINB12, FOLATE, FERRITIN, TIBC, IRON, RETICCTPCT in the last 72 hours. Urine analysis:    Component Value Date/Time   COLORURINE STRAW (A) 04/13/2019 0044   APPEARANCEUR CLEAR 04/13/2019 0044   LABSPEC 1.009 04/13/2019 0044   PHURINE 5.0 04/13/2019 0044   GLUCOSEU NEGATIVE 04/13/2019 0044   HGBUR NEGATIVE 04/13/2019 0044   BILIRUBINUR NEGATIVE 04/13/2019 0044   KETONESUR NEGATIVE 04/13/2019 0044   PROTEINUR NEGATIVE 04/13/2019 0044   NITRITE NEGATIVE 04/13/2019 0044   LEUKOCYTESUR NEGATIVE 04/13/2019 0044   Sepsis Labs: @LABRCNTIP (procalcitonin:4,lacticidven:4)  ) Recent Results (from the past 240 hour(s))  SARS Coronavirus 2 (CEPHEID - Performed in [facility] lab), Hosp Order     Status: None   Collection Time: 04/11/19  6:54 PM   Specimen: Nasopharyngeal Swab  Result Value Ref Range Status   SARS Coronavirus 2 NEGATIVE NEGATIVE Final    Comment: (NOTE) If result is NEGATIVE SARS-CoV-2 target nucleic acids are NOT DETECTED. The SARS-CoV-2 RNA is generally detectable in upper and lower  respiratory specimens during the acute phase of infection. The lowest  concentration of SARS-CoV-2 viral copies this assay can detect is 250  copies / mL. A negative result does not preclude SARS-CoV-2 infection  and should not be used as the sole basis for treatment or other  patient management decisions.  A negative result may occur with  improper specimen collection / handling, submission of specimen other  than nasopharyngeal swab, presence of viral mutation(s) within the  areas targeted by this assay, and inadequate number of viral copies  (<250 copies / mL). A negative result must be combined with clinical   observations, patient history, and epidemiological information. If result is POSITIVE SARS-CoV-2 target nucleic acids are DETECTED. The SARS-CoV-2 RNA is generally detectable in upper and lower  respiratory specimens dur ing the acute phase of infection.  Positive  results are indicative of active infection with SARS-CoV-2.  Clinical  correlation with patient history and other diagnostic information is  necessary to determine patient infection status.  Positive results do  not rule out bacterial infection or co-infection with other viruses. If result is PRESUMPTIVE POSTIVE SARS-CoV-2 nucleic acids MAY BE PRESENT.   A presumptive positive result was obtained on the submitted specimen  and confirmed on repeat testing.  While 2019 novel coronavirus  (SARS-CoV-2) nucleic acids may be present in the submitted sample  additional confirmatory testing may be necessary for epidemiological  and / or clinical management purposes  to differentiate between  SARS-CoV-2 and other Sarbecovirus currently known to infect humans.  If clinically indicated additional testing with an alternate test  methodology [phone]) is advised. The SARS-CoV-2 RNA is generally  detectable in upper and lower respiratory sp ecimens during the acute  phase of infection. The expected result is Negative. Fact Sheet for Patients:  StrictlyIdeas.no Fact Sheet for Healthcare Providers: BankingDealers.co.za This test is not yet approved or cleared by the [place] FDA and has been authorized for detection and/or diagnosis of SARS-CoV-2 by FDA under an Emergency Use Authorization (EUA).  This EUA will remain in effect (meaning this test can be used) for the duration of the COVID-19 declaration under Section 564(b)(1) of the Act, 21 U.S.C. section 360bbb-3(b)(1), unless the authorization is terminated or revoked sooner. Performed at [facility], [place] [address],  [place], [place] [address]   Novel Coronavirus,NAA,(SEND-OUT TO REF LAB - TAT 24-48 hrs); Hosp Order     Status: None   Collection Time: 04/12/19  4:03 PM   Specimen: Nasopharyngeal Swab; Respiratory  Result Value Ref Range Status   SARS-CoV-2, NAA NOT DETECTED NOT DETECTED Final    Comment: (NOTE) This test was developed and its performance characteristics determined by [employer]. This test has not been FDA cleared or approved. This test has been authorized by FDA under an Emergency Use Authorization (EUA). This test is only authorized for the duration of time the declaration that circumstances exist justifying the authorization of the emergency use of in vitro diagnostic tests for detection of SARS-CoV-2 virus and/or diagnosis of COVID-19 infection under section 564(b)(1) of the Act, 21 U.S.C. 283TDV-7(O)(1), unless the authorization is terminated or revoked sooner. When diagnostic testing is negative, the possibility of a false negative result should be considered in the context of a patient's recent exposures and the presence of clinical signs and symptoms consistent with COVID-19. An individual without symptoms of COVID-19 and who is not shedding SARS-CoV-2 virus would expect to have a negative (not detected) result in this assay. Performed  At: [facility] [address] [place], [place] [id number] Rush Farmer MD IR:[id number]    [place]  Final    Comment: Performed at [facility], [place] [address], [place],  [address]      Studies: No results found.  Scheduled Meds: . atorvastatin  20 mg Oral QHS  . docusate sodium  100 mg Oral BID  . ferrous sulfate  325 mg Oral Daily  . insulin aspart  0-15 Units Subcutaneous TID WC  . insulin aspart  0-5 Units Subcutaneous QHS  . ipratropium  2 puff Inhalation Q8H  . labetalol  200 mg Oral q morning - 10a  . levalbuterol  2 puff Inhalation Q8H  . lisinopril  40 mg Oral q  morning - 10a  . pantoprazole  40 mg Oral Daily  . polyvinyl alcohol  2 drop Both Eyes BID  . potassium chloride  40 mEq Oral Daily  . rivaroxaban  20 mg Oral Daily  . sodium chloride flush  3 mL Intravenous Q12H  . spironolactone  50 mg Oral q morning - 10a    Continuous Infusions: . sodium chloride    . furosemide (LASIX) infusion 10 mg/hr (04/13/19 1452)     LOS: 3 days     Kayleen Memos, MD [facility] Pager [phone]  If 7PM-7AM, please contact night-coverage [email or web address] Password [facility] 04/14/2019, 12:22 PM

## 2019-04-15 ENCOUNTER — Ambulatory Visit: Payer: Self-pay | Attending: Nurse Practitioner | Admitting: Nurse Practitioner

## 2019-04-15 DIAGNOSIS — I480 Paroxysmal atrial fibrillation: Secondary | ICD-10-CM

## 2019-04-15 LAB — BASIC METABOLIC PANEL
Anion gap: 13 (ref 5–15)
BUN: 27 mg/dL — ABNORMAL HIGH (ref 6–20)
CO2: 30 mmol/L (ref 22–32)
Calcium: 9.3 mg/dL (ref 8.9–10.3)
Chloride: 92 mmol/L — ABNORMAL LOW (ref 98–111)
Creatinine, Ser: 1.89 mg/dL — ABNORMAL HIGH (ref 0.61–1.24)
GFR calc Af Amer: 46 mL/min — ABNORMAL LOW (ref >60)
GFR calc non Af Amer: 40 mL/min — ABNORMAL LOW (ref >60)
Glucose, Bld: 140 mg/dL — ABNORMAL HIGH (ref 70–99)
Potassium: 3.9 mmol/L (ref 3.5–5.1)
Sodium: 135 mmol/L (ref 135–145)

## 2019-04-15 LAB — GLUCOSE, CAPILLARY
Glucose-Capillary: 126 mg/dL — ABNORMAL HIGH (ref 70–99)
Glucose-Capillary: 149 mg/dL — ABNORMAL HIGH (ref 70–99)
Glucose-Capillary: 101 mg/dL — ABNORMAL HIGH (ref 70–99)
Glucose-Capillary: 110 mg/dL — ABNORMAL HIGH (ref 70–99)

## 2019-04-15 MED ORDER — OXYCODONE HCL 5 MG PO TABS
5.0000 mg | ORAL_TABLET | Freq: Four times a day (QID) | ORAL | Status: DC | PRN
  Administered 2019-04-15 – 2019-04-17 (×3): 5 mg via ORAL
  Filled 2019-04-15 (×3): qty 1

## 2019-04-15 MED ORDER — FUROSEMIDE 10 MG/ML IJ SOLN
40.0000 mg | Freq: Three times a day (TID) | INTRAMUSCULAR | Status: DC
  Administered 2019-04-15 – 2019-04-16 (×4): 40 mg via INTRAVENOUS
  Filled 2019-04-15 (×4): qty 4

## 2019-04-15 NOTE — Plan of Care (Signed)
Nutrition Education Note  RD consulted for nutrition education regarding CHF and diabetes.   Lab Results  Component Value Date   HGBA1C 7.6 (H) 01/23/2019   PTA DM medications are 500 mg metformin BID.   Labs reviewed: CBGS: 110-126 (inpatient orders for glycemic control are 0-5 units insulin aspart q HS and 0-15 units insulin aspart TID with meals).    Spoke with pt over phone, who was pleasant and in good spirits at time of visit. He reports feeling better, but expresses concern over fluid retention. PTA, he reports fair appetite. He consumes 3 meals per day (Breakfast: raisin bran cereal; Lunch: cheese and crackers; Dinner: 2 egg sandwiches on toast OR boiled chicken/hamburger with canned green beans). Pt also admits that he was consuming large amounts of [facility] PTA; approximately 8-9 20 ounce bottles daily ("that's why I'm here in the hospital").   Pt shares that he has been trying to reduce added sodium in his diet by not adding seasoning or spices to his food. He also admits to snacking on potato chips. Most of session was spent identifying sources of hidden sodium in diet as well as ways to decrease non-nutritive beverages, such as mountain dew. Pt intends to cook more at home. Also discussed importance of self-management of DM and heart failure to prevent further hospitalizations and complications. Pt intends on purchasing a scale at discharge.   RD provided "Heart Healthy, Consistent Carbohydrate Nutrition Therapy" handout from the Academy of Nutrition and Dietetics; handout attached to AVS/ discharge instructions and pt aware that this is how he will receive written material. Reviewed patient's dietary recall. Provided examples on ways to decrease sodium intake in diet. Discouraged intake of processed foods and use of salt shaker. Encouraged fresh fruits and vegetables as well as whole grain sources of carbohydrates to maximize fiber intake.   RD discussed why it is important for  patient to adhere to diet recommendations, and emphasized the role of fluids, foods to avoid, and importance of weighing self daily.   Discussed different food groups and their effects on blood sugar, emphasizing carbohydrate-containing foods. Provided list of carbohydrates and recommended serving sizes of common foods.  Discussed importance of controlled and consistent carbohydrate intake throughout the day. Provided examples of ways to balance meals/snacks and encouraged intake of high-fiber, whole grain complex carbohydrates. Teach back method used.  Expect fair compliance.  Body mass index is 57.2 kg/m. Pt meets criteria for extreme obesity, class III based on current BMI.  Current diet order is renal/ carb modified with 1.2 L fluid restriction, patient is consuming approximately 80-100% of meals at this time. Labs and medications reviewed. No further nutrition interventions warranted at this time. RD contact information provided. If additional nutrition issues arise, please re-consult RD.   Rebecka Oelkers A. Jimmye Norman, RD, LDN, [place] Registered Dietitian II Certified Diabetes Care and Education Specialist Pager: [phone] After hours Pager: [phone]

## 2019-04-15 NOTE — Progress Notes (Signed)
Patient refusing CPAP.  Patient stated, "he was alright" when asked about using it.  Patient does not appear to be in any distress, will continue to monitor.

## 2019-04-15 NOTE — Progress Notes (Signed)
Progress Note  Patient Name: Benjamin [name] Date of Encounter: 04/15/2019  Primary Cardiologist: Harrington Challenger  Subjective   Pt feeling better but having cramping all over. He thinks he is still swollen in his scrotum and legs.   Inpatient Medications    Scheduled Meds: . atorvastatin  20 mg Oral QHS  . docusate sodium  100 mg Oral BID  . ferrous sulfate  325 mg Oral Daily  . insulin aspart  0-15 Units Subcutaneous TID WC  . insulin aspart  0-5 Units Subcutaneous QHS  . ipratropium  2 puff Inhalation Q8H  . labetalol  200 mg Oral q morning - 10a  . levalbuterol  2 puff Inhalation Q8H  . lisinopril  40 mg Oral q morning - 10a  . pantoprazole  40 mg Oral Daily  . polyvinyl alcohol  2 drop Both Eyes BID  . potassium chloride  40 mEq Oral Daily  . rivaroxaban  20 mg Oral Daily  . sodium chloride flush  3 mL Intravenous Q12H  . spironolactone  50 mg Oral q morning - 10a   Continuous Infusions: . sodium chloride    . furosemide (LASIX) infusion 10 mg/hr (04/14/19 2328)   PRN Meds: sodium chloride, acetaminophen, hydrALAZINE, methocarbamol, ondansetron (ZOFRAN) IV, polyethylene glycol, sodium chloride flush   Vital Signs    Vitals:   04/14/19 1936 04/14/19 2314 04/15/19 0500 04/15/19 0525  BP: (!) 131/96   126/82  Pulse: 89   82  Resp: 20     Temp: 98.2 F (36.8 C)   98.4 F (36.9 C)  TempSrc: Oral   Oral  SpO2: 93% 93%  (!) 89%  Weight:   (!) 186 kg   Height:        Intake/Output Summary (Last 24 hours) at 04/15/2019 0834 Last data filed at 04/15/2019 0735 Gross per 24 hour  Intake 970.16 ml  Output 3800 ml  Net -2829.84 ml   Last 3 Weights 04/15/2019 04/14/2019 04/14/2019  Weight (lbs) 410 lb 1.6 oz 412 lb 1.6 oz 415 lb 3.2 oz  Weight (kg) 186.02 kg 186.927 kg 188.333 kg      Telemetry    Sinus - Personally Reviewed  ECG    No AM EKG- Personally Reviewed  Physical Exam   GEN: No acute distress.   Neck: Unable to assess JVD due to neck size Cardiac:  RRR, no murmurs, rubs, or gallops.  Respiratory: Clear to auscultation bilaterally. GI: Soft, nontender, non-distended  Ext: His legs are firm but no pitting edema.  Neuro:  Nonfocal  Psych: Normal affect   Labs    High Sensitivity Troponin:   Recent Labs  Lab 04/11/19 1338 04/11/19 1538  TROPONINIHS 14 16      Cardiac EnzymesNo results for input(s): TROPONINI in the last 168 hours. No results for input(s): TROPIPOC in the last 168 hours.   Chemistry Recent Labs  Lab 04/12/19 1138 04/13/19 0654 04/14/19 0722 04/15/19 0641  NA  --  137 135 135  K  --  3.7 4.2 3.9  CL  --  97* 97* 92*  CO2  --  30 28 30   GLUCOSE  --  137* 114* 140*  BUN  --  16 17 27*  CREATININE  --  1.30* 1.24 1.89*  CALCIUM  --  8.8* 8.9 9.3  PROT 7.2  --   --   --   ALBUMIN 3.5  --   --   --   AST 19  --   --   --  ALT 14  --   --   --   ALKPHOS 51  --   --   --   BILITOT 0.2*  --   --   --   GFRNONAA  --  >60 >60 40*  GFRAA  --  >60 >60 46*  ANIONGAP  --  10 10 13      Hematology Recent Labs  Lab 04/11/19 1338  WBC 8.2  RBC 4.58  HGB 11.0*  HCT 37.2*  MCV 81.2  MCH 24.0*  MCHC 29.6*  RDW 16.3*  PLT 400    BNP Recent Labs  Lab 04/11/19 1610  BNP 25.5     DDimer  Recent Labs  Lab 04/12/19 1138  DDIMER 0.37     Radiology    No results found.  Cardiac Studies   Echo May 2020:  1. The left ventricle has normal systolic function, with an ejection fraction of 60-65%. The cavity size was normal. There is mildly increased left ventricular wall thickness. Left ventricular diastolic Doppler parameters are consistent with impaired  relaxation.  2. Left atrial size was not well visualized.  3. The aortic valve was not well visualized Aortic valve regurgitation was not assessed by color flow Doppler.  4. Extremely limited; definity used; normal LV systolic function; mild diastolic dysfunction; right heart not well visualized.  Patient Profile     53 y.o. male with history  of chronic diastolic CHF, HTN, PAF, DM and recent Covid infection who is admitted with acute on chronic diastolic CHF.   Assessment & Plan    1. Acute on chronic diastolic CHF: He is diuresing well. He is negative 10.2 liters since admission, 2.8 liters over the last 24 hours. Worsening of renal function today. He is still at least mildly volume overloaded based in his reporting ongoing scrotal edema and leg edema. He does not think he is back to baseline. It is hard to assess his fluid status given his morbid obesity. Given worsening of renal function, I would change his Lasix drip to TID dosing today. Will change to Lasix 40 mg IV TID    2. Atrial fibrillation, paroxysmal: Continue Xarelto.   3. Hyperlipidemia: Continue statin  4. HTN: BP stable today  5. 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 He is on Xarelto.    For questions or updates, please contact [facility] Please consult [email or web address] for contact info under        Signed, Verne Carrow, MD  04/15/2019, 8:34 AM

## 2019-04-15 NOTE — Progress Notes (Signed)
PROGRESS NOTE  Benjamin [name] BJY:[id number] DOB: December 06, 1965 DOA: 04/11/2019 PCP: Claiborne RiggFleming, [name] W, NP  HPI/Recap of past 24 hours: Benjamin [name]  is a 53 y.o. male, with medical history significant ofdCHF,hypertension, hyperlipidemia, diabetes mellitus, GERD, atrial fibrillationandPE on Xarelto,OSA on CPAP, iron deficiency anemia, morbid obesity, chronic elevation of troponin , as well recent COVID-19 infection. Patient presents secondary to worsening lower extremity edema and scrotal edema, she had recent ED visit for the same, patient report he has been compliant with his medications, including Lasix 80 mg 3 times a day, reports his legs feel very heavy, and uncomfortable, as well as scrotum and significantly swollen as well limiting his movement, upon further questioning, patient report he is compliant with fluid restriction, salt restriction, reports mildly worsening dyspnea, mainly upon laying supine, denies any chest pain, cough, fever, chills, nausea, vomiting, hemoptysis, diarrhea, or blood per rectum. - in ED no significant labs abnormalities, oxygen requirement at baseline, but patient was noted to have significantly swollen scrotum and +2 lower extremity edema, received 80 mg IV Lasix as I was called to admit for volume overload.  Started on lasix drip and ongoing use with improvement of volume status.  04/15/19: Patient seen and examined at his bedside.  No acute events overnight.  Does not feel like his volume status is back to baseline.  Ongoing diuresing.   Assessment/Plan: Active Problems:   PE (pulmonary thromboembolism) (HCC)   Diabetes mellitus type 2 in obese (HCC)   Bilateral lower extremity edema   Obesity, Class III, BMI 40-49.9 (morbid obesity) (HCC)   Chronic diastolic CHF (congestive heart failure) (HCC)   PAF (paroxysmal atrial fibrillation) (HCC)   COVID-19 virus infection   OSA on CPAP   HLD (hyperlipidemia)   GERD (gastroesophageal reflux disease)    Acute on chronic diastolic CHF (congestive heart failure) (HCC)   Severe Anasarca/volume overload possibly secondary to acute on chronic diastolic CHF  Presented with significant volume overload with 3+ pitting edema in bilateral lower extremities, significant scrotal edema Last CMP 03/12/2019 with normal LFTs. Started on Lasix drip, Lasix drip Dc'd on 04/15/2019 Started on IV Lasix 40 mg 3 times daily Continue strict I's and O's and daily weight Net I&O -2.2 L >> -7.L >> -10.4L since admission Continues to improve but still significantly fluid overload. Closely monitor urine output and renal function Continue salt restriction less than 2 g/day Continue fluid restriction less than 1200 cc/day  AKI on CKD 2 Baseline creatinine appears to be 1.2 with GFR greater than 60 Creatinine on 04/15/19 was 1.89 Good urine output Lasix drip stopped On IV Lasix 40 mg 3 times daily Continue daily BMPs  Paroxysmal A. Fib Currently in sinus rhythm Rate controlled, on labetalol On Xarelto for CVA prevention and history of PE DVT  Scrotal edema/Large left spermatocele? with tenderness likely secondary to fluid overload If no improvement of scrotal edema with Lasix drip we will consult urology Afebrile with no leukocytosis Scrotal US revealed: Normal appearing testes with small BILATERAL hydroceles. Loculated cysts adjacent to LEFT testis question spermatoceles versus epididymal cysts largest 5.6 cm in greatest size. Curbsided with urology Dr.Wrenn who looked at the labs and US. We will continue to diurese, if his edema resolves and his cardiac risk is acceptable and still symptomatic he could be considered for surgical repair however he may not be a good candidate.  Acute on chronic diastolic CHF BNP 25. Last BNP 34 from 04/01/2019 Continue diuresing On lasix drip and 1 dose  xaroxylyn Continue strict I's and O's and daily weight Continue Lasix drip Monitor renal function on Lasix drip  Acute  on chronic hypoxic respiratory failure likely multifactorial in the setting of fluid overload, improving  On oxygen supplementation 2L at baseline Continue bronchodilators Maintain O2 saturation greater than 92% Home O2 eval when close to dc  Recent COVID-19 infection First dx positive covid-19 in 02/06/19, [facility] and discharged after 6 days on O2, recommended cardiology follow up. Afebrile Denies chest pain or dyspnea or cough.  Noted prior positive COVID-19 on 03/11/2019, repeated COVID-19 test negative on 04/11/2019.  Independently reviewed chest x-ray done on admission which showed markings similar to prior chest x-ray done on 04/01/19, improved from cxr done on 03/11/19, markings more prominent at bases.  Inflammatory markers trended down Negative covid x 2 this admission including 1 negative send out test VS reviewed and are stable  Resolved Uncontrolled hypertension BP is at goal Continue antihypertensives Continue IV hydralazine PRN for SBP greater than 160  Severe Morbid obesity BMI 57 Recommend weight loss outpatient with regular physical activity and healthy diet once stable  Type 2 diabetes with hyperglycemia Last hemoglobin A1c 7.6 on 01/23/2019 Continue insulin coverage  History of PE and DVT Continue xarelto Maintain O2 saturation greater than 92%  OSA Continue CPAP at night   Code Status: Full code   Family Communication: We will call family if okay with the patient.  Disposition Plan: Home possibly in 2 to 3 days when volume status is close to baseline and cardiology signs off.   Consultants:  Cardiology  Curbsided with urology, Dr. Annabell HowellsWrenn on 04/14/19. Not a formal consult.  Procedures:  None   Antimicrobials:  None  DVT prophylaxis:  Xarelto   Objective: Vitals:   04/15/19 0500 04/15/19 0525 04/15/19 0843 04/15/19 1205  BP:  126/82 123/80 (!) 135/94  Pulse:  82 89 82  Resp:    18  Temp:  98.4 F (36.9 C)  97.7 F (36.5 C)   TempSrc:  Oral  Oral  SpO2:  (!) 89%  91%  Weight: (!) 186 kg     Height:        Intake/Output Summary (Last 24 hours) at 04/15/2019 1226 Last data filed at 04/15/2019 1000 Gross per 24 hour  Intake 970.16 ml  Output 3620 ml  Net -2649.84 ml   Filed Weights   04/14/19 0612 04/14/19 1053 04/15/19 0500  Weight: (!) 188.3 kg (!) 186.9 kg (!) 186 kg    Exam:  . General: 53 y.o. year-old male morbidly obese in nonacute distress.  Alert and oriented x3.   . Cardiovascular: Regular rate and rhythm no rubs or gallops no JVD no thyromegaly.   Marland Kitchen. Respiratory: Clear to Auscultation No Wheezes No Rales.  Poor inspiratory effort.   . Abdomen: Morbidly obese nontender with normal bowel sounds present. . Musculoskeletal: 2+ pitting edema lower extremities bilaterally. Marland Kitchen. Psychiatry: Mood is appropriate for condition and setting.  Data Reviewed: CBC: Recent Labs  Lab 04/11/19 1338  WBC 8.2  NEUTROABS 4.4  HGB 11.0*  HCT 37.2*  MCV 81.2  PLT 400   Basic Metabolic Panel: Recent Labs  Lab 04/11/19 1338 04/12/19 0645 04/13/19 0654 04/14/19 0722 04/15/19 0641  NA 138 137 137 135 135  K 4.0 4.0 3.7 4.2 3.9  CL 100 97* 97* 97* 92*  CO2 31 28 30 28 30   GLUCOSE 104* 179* 137* 114* 140*  BUN 10 11 16 17  27*  CREATININE 1.05  1.24 1.30* 1.24 1.89*  CALCIUM 9.1 9.4 8.8* 8.9 9.3  MG  --   --  1.9  --   --    GFR: Estimated Creatinine Clearance: 76.5 mL/min (A) (by C-G formula based on SCr of 1.89 mg/dL (H)). Liver Function Tests: Recent Labs  Lab 04/12/19 1138  AST 19  ALT 14  ALKPHOS 51  BILITOT 0.2*  PROT 7.2  ALBUMIN 3.5   No results for input(s): LIPASE, AMYLASE in the last 168 hours. No results for input(s): AMMONIA in the last 168 hours. Coagulation Profile: No results for input(s): INR, PROTIME in the last 168 hours. Cardiac Enzymes: No results for input(s): CKTOTAL, CKMB, CKMBINDEX, TROPONINI in the last 168 hours. BNP (last 3 results) No results for input(s):  PROBNP in the last 8760 hours. HbA1C: No results for input(s): HGBA1C in the last 72 hours. CBG: Recent Labs  Lab 04/14/19 1130 04/14/19 1639 04/14/19 2115 04/15/19 0618 04/15/19 1113  GLUCAP 121* 110* 110* 126* 149*   Lipid Profile: No results for input(s): CHOL, HDL, LDLCALC, TRIG, CHOLHDL, LDLDIRECT in the last 72 hours. Thyroid Function Tests: No results for input(s): TSH, T4TOTAL, FREET4, T3FREE, THYROIDAB in the last 72 hours. Anemia Panel: No results for input(s): VITAMINB12, FOLATE, FERRITIN, TIBC, IRON, RETICCTPCT in the last 72 hours. Urine analysis:    Component Value Date/Time   COLORURINE STRAW (A) 04/13/2019 0044   APPEARANCEUR CLEAR 04/13/2019 0044   LABSPEC 1.009 04/13/2019 0044   PHURINE 5.0 04/13/2019 0044   GLUCOSEU NEGATIVE 04/13/2019 0044   HGBUR NEGATIVE 04/13/2019 0044   BILIRUBINUR NEGATIVE 04/13/2019 0044   KETONESUR NEGATIVE 04/13/2019 0044   PROTEINUR NEGATIVE 04/13/2019 0044   NITRITE NEGATIVE 04/13/2019 0044   LEUKOCYTESUR NEGATIVE 04/13/2019 0044   Sepsis Labs: @LABRCNTIP (procalcitonin:4,lacticidven:4)  ) Recent Results (from the past 240 hour(s))  SARS Coronavirus 2 (CEPHEID - Performed in [place] hospital lab), Hosp Order     Status: None   Collection Time: 04/11/19  6:54 PM   Specimen: Nasopharyngeal Swab  Result Value Ref Range Status   SARS Coronavirus 2 NEGATIVE NEGATIVE Final    Comment: (NOTE) If result is NEGATIVE SARS-CoV-2 target nucleic acids are NOT DETECTED. The SARS-CoV-2 RNA is generally detectable in upper and lower  respiratory specimens during the acute phase of infection. The lowest  concentration of SARS-CoV-2 viral copies this assay can detect is 250  copies / mL. A negative result does not preclude SARS-CoV-2 infection  and should not be used as the sole basis for treatment or other  patient management decisions.  A negative result may occur with  improper specimen collection / handling, submission of  specimen other  than nasopharyngeal swab, presence of viral mutation(s) within the  areas targeted by this assay, and inadequate number of viral copies  (<250 copies / mL). A negative result must be combined with clinical  observations, patient history, and epidemiological information. If result is POSITIVE SARS-CoV-2 target nucleic acids are DETECTED. The SARS-CoV-2 RNA is generally detectable in upper and lower  respiratory specimens dur ing the acute phase of infection.  Positive  results are indicative of active infection with SARS-CoV-2.  Clinical  correlation with patient history and other diagnostic information is  necessary to determine patient infection status.  Positive results do  not rule out bacterial infection or co-infection with other viruses. If result is PRESUMPTIVE POSTIVE SARS-CoV-2 nucleic acids MAY BE PRESENT.   A presumptive positive result was obtained on the submitted specimen  and confirmed  on repeat testing.  While 2019 novel coronavirus  (SARS-CoV-2) nucleic acids may be present in the submitted sample  additional confirmatory testing may be necessary for epidemiological  and / or clinical management purposes  to differentiate between  SARS-CoV-2 and other Sarbecovirus currently known to infect humans.  If clinically indicated additional testing with an alternate test  methodology [phone]([id number]) is advised. The SARS-CoV-2 RNA is generally  detectable in upper and lower respiratory sp ecimens during the acute  phase of infection. The expected result is Negative. Fact Sheet for Patients:  BoilerBrush.com.cyhttps://www.fda.gov/media/136312/download Fact Sheet for Healthcare Providers: https://pope.com/https://www.fda.gov/media/136313/download This test is not yet approved or cleared by the [place] FDA and has been authorized for detection and/or diagnosis of SARS-CoV-2 by FDA under an Emergency Use Authorization (EUA).  This EUA will remain in effect (meaning this test can be used) for the  duration of the COVID-19 declaration under Section 564(b)(1) of the Act, 21 U.S.C. section 360bbb-3(b)(1), unless the authorization is terminated or revoked sooner. Performed at [facility], [address] [place], [place] [address]   Novel Coronavirus,NAA,(SEND-OUT TO REF LAB - TAT 24-48 hrs); Hosp Order     Status: None   Collection Time: 04/12/19  4:03 PM   Specimen: Nasopharyngeal Swab; Respiratory  Result Value Ref Range Status   SARS-CoV-2, NAA NOT DETECTED NOT DETECTED Final    Comment: (NOTE) This test was developed and its performance characteristics determined by [facility]. This test has not been FDA cleared or approved. This test has been authorized by FDA under an Emergency Use Authorization (EUA). This test is only authorized for the duration of time the declaration that circumstances exist justifying the authorization of the emergency use of in vitro diagnostic tests for detection of SARS-CoV-2 virus and/or diagnosis of COVID-19 infection under section 564(b)(1) of the Act, 21 U.S.C. 956OZH-0(Q)(6360bbb-3(b)(1), unless the authorization is terminated or revoked sooner. When diagnostic testing is negative, the possibility of a false negative result should be considered in the context of a patient's recent exposures and the presence of clinical signs and symptoms consistent with COVID-19. An individual without symptoms of COVID-19 and who is not shedding SARS-CoV-2 virus would expect to have a negative (not detected) result in this assay. Performed  At: [facility] [address] [place], [place] [id number] Jolene SchimkeNagendra Sanjai MD BM:[id number]:[phone]    Coronavirus Source NASOPHARYNGEAL  Final    Comment: Performed at [facility], [address] [place], [place] [address]      Studies: No results found.  Scheduled Meds: . atorvastatin  20 mg Oral QHS  . docusate sodium  100 mg Oral BID  . ferrous sulfate  325 mg Oral Daily  . furosemide  40 mg  Intravenous TID  . insulin aspart  0-15 Units Subcutaneous TID WC  . insulin aspart  0-5 Units Subcutaneous QHS  . ipratropium  2 puff Inhalation Q8H  . labetalol  200 mg Oral q morning - 10a  . levalbuterol  2 puff Inhalation Q8H  . pantoprazole  40 mg Oral Daily  . polyvinyl alcohol  2 drop Both Eyes BID  . potassium chloride  40 mEq Oral Daily  . rivaroxaban  20 mg Oral Daily  . sodium chloride flush  3 mL Intravenous Q12H    Continuous Infusions: . sodium chloride       LOS: 4 days     Darlin Droparole N Valma Rotenberg, MD [facility] Pager [phone]  If 7PM-7AM, please contact night-coverage [email or web address] Password  [email or web address] 04/15/2019, 12:26 PM

## 2019-04-16 DIAGNOSIS — N179 Acute kidney failure, unspecified: Secondary | ICD-10-CM

## 2019-04-16 LAB — GLUCOSE, CAPILLARY
Glucose-Capillary: 106 mg/dL — ABNORMAL HIGH (ref 70–99)
Glucose-Capillary: 108 mg/dL — ABNORMAL HIGH (ref 70–99)
Glucose-Capillary: 97 mg/dL (ref 70–99)
Glucose-Capillary: 187 mg/dL — ABNORMAL HIGH (ref 70–99)

## 2019-04-16 LAB — BASIC METABOLIC PANEL
Anion gap: 13 (ref 5–15)
BUN: 40 mg/dL — ABNORMAL HIGH (ref 6–20)
CO2: 29 mmol/L (ref 22–32)
Calcium: 9 mg/dL (ref 8.9–10.3)
Chloride: 93 mmol/L — ABNORMAL LOW (ref 98–111)
Creatinine, Ser: 2.38 mg/dL — ABNORMAL HIGH (ref 0.61–1.24)
GFR calc Af Amer: 35 mL/min — ABNORMAL LOW (ref >60)
GFR calc non Af Amer: 30 mL/min — ABNORMAL LOW (ref >60)
Glucose, Bld: 114 mg/dL — ABNORMAL HIGH (ref 70–99)
Potassium: 4.1 mmol/L (ref 3.5–5.1)
Sodium: 135 mmol/L (ref 135–145)

## 2019-04-16 MED ORDER — IPRATROPIUM BROMIDE 0.02 % IN SOLN
0.5000 mg | Freq: Four times a day (QID) | RESPIRATORY_TRACT | Status: DC
  Administered 2019-04-17: 0.5 mg via RESPIRATORY_TRACT

## 2019-04-16 MED ORDER — SODIUM CHLORIDE 0.9% FLUSH
3.0000 mL | Freq: Two times a day (BID) | INTRAVENOUS | Status: DC
  Administered 2019-04-16 – 2019-04-19 (×6): 3 mL via INTRAVENOUS

## 2019-04-16 MED ORDER — SODIUM CHLORIDE 0.9 % IV SOLN
INTRAVENOUS | Status: DC
  Administered 2019-04-17: 07:00:00 via INTRAVENOUS

## 2019-04-16 MED ORDER — IPRATROPIUM BROMIDE HFA 17 MCG/ACT IN AERS: 2.0000 | INHALATION_SPRAY | Freq: Three times a day (TID) | RESPIRATORY_TRACT | Status: DC

## 2019-04-16 MED ORDER — SODIUM CHLORIDE 0.9 % IV SOLN: 250.0000 mL | INTRAVENOUS | Status: DC | PRN

## 2019-04-16 MED ORDER — LABETALOL HCL 200 MG PO TABS
200.0000 mg | ORAL_TABLET | Freq: Two times a day (BID) | ORAL | Status: DC
  Administered 2019-04-16 – 2019-04-19 (×6): 200 mg via ORAL
  Filled 2019-04-16 (×6): qty 1

## 2019-04-16 MED ORDER — SODIUM CHLORIDE 0.9% FLUSH: 3.0000 mL | INTRAVENOUS | Status: DC | PRN

## 2019-04-16 MED ORDER — LEVALBUTEROL TARTRATE 45 MCG/ACT IN AERO
2.0000 | INHALATION_SPRAY | Freq: Four times a day (QID) | RESPIRATORY_TRACT | Status: DC
  Administered 2019-04-17 (×3): 2 via RESPIRATORY_TRACT
  Filled 2019-04-16: qty 15

## 2019-04-16 MED ORDER — ASPIRIN 81 MG PO CHEW
81.0000 mg | CHEWABLE_TABLET | ORAL | Status: AC
  Administered 2019-04-17: 81 mg via ORAL
  Filled 2019-04-16: qty 1

## 2019-04-16 NOTE — H&P (View-Only) (Signed)
Advanced Heart Failure Team Consult Note   Primary Physician: No primary care provider on file. PCP-Cardiologist:  Dorris Carnes, MD  Reason for Consultation: Heart Failure   HPI:    Benjamin [name] is seen today for evaluation of heart failure at the request of Dr Angelena Form.   MAdmitted 01/21/2019 with increased dyspnea and had PE. He was placed on back on coumadin .  Patient re-admitted 5/20 for syncope/cough/dyspnea, COVID+, CT positive for bilateral GGOs, admitted to [place], discharged after 6 days, on O2.  Rec'd Cards f/u.  Patient re-admitted 6/3 for bright red blood per rectum, eval'ed by GI, had EGD/colon, dx'd with hemorrhoidal bleeding, discharged after 9 days on O2. He was again Covid + .  Readmitted 03/11/19 with increased shortness of breath. He did not have home oxygen. He was discharged to home the next day.   He presented to [facility] ED 04/11/19 with lower extremity edema and scrotal edema. CXR with no acute disease. Pertinent admission labs: Covid 19 negative, BNP 25, HS Trop <18, K 4, creatinine 1.05, and Hgb 11. Placed on lasix drip 10 mg per hour and later switched to lasix 40 mg IV tid due to creatinine bump. Weight has come down from 419>409.6 pounds.    Echo 01/2019:EF 60-65%, grade I DD  Review of Systems: [y] = yes, _0  = no   . General: Weight gain [Y ]; Weight loss _1 ; Anorexia _2 ; Fatigue [Y ]; Fever _3 ; Chills _4 ; Weakness _5   . Cardiac: Chest pain/pressure _6 ; Resting SOB _7 ; Exertional SOB [Y ]; Orthopnea [name] ]; Pedal Edema [Y ]; Palpitations _8 ; Syncope _9 ; Presyncope _10 ; Paroxysmal nocturnal dyspnea_11   . Pulmonary: Cough _12 ; Wheezing_13 ; Hemoptysis_14 ; Sputum _15 ; Snoring _16   . GI: Vomiting_17 ; Dysphagia_18 ; Melena_19 ; Hematochezia _20 ; Heartburn_21 ; Abdominal pain _22 ; Constipation _23 ; Diarrhea _24 ; BRBPR _25   . GU: Hematuria_26 ; Dysuria _27 ; Nocturia_28   . Vascular: Pain in legs with walking _29 ; Pain in feet with lying flat _30 ; Non-healing sores  _31 ; Stroke _32 ; TIA _33 ; Slurred speech _34 ;  . Neuro: Headaches_35 ; Vertigo_36 ; Seizures_37 ; Paresthesias_38 ;Blurred vision _39 ; Diplopia _40 ; Vision changes _41   . Ortho/Skin: Arthritis _42 ; Joint pain [ Y]; Muscle pain _43 ; Joint swelling _44 ; Back Pain [Y ]; Rash _45   . Psych: Depression_46 ; Anxiety_47   . Heme: Bleeding problems _48 ; Clotting disorders _49 ; Anemia _50   . Endocrine: Diabetes _51 ; Thyroid dysfunction_52   Home Medications Prior to Admission medications   Medication Sig Start Date End Date Taking? Authorizing Provider  amLODipine (NORVASC) 10 MG tablet Take 1 tablet (10 mg total) by mouth every morning. Patient will pick up scripts today. 03/18/19 06/16/19 Yes [name], NP  atorvastatin (LIPITOR) 20 MG tablet Take 1 tablet (20 mg total) by mouth at bedtime. Patient will pick up scripts today. Patient taking differently: Take 20 mg by mouth at bedtime.  03/18/19 06/16/19 Yes [name], NP  docusate sodium (COLACE) 100 MG capsule Take 1 capsule (100 mg total) by mouth 2 (two) times daily. 03/01/19  Yes Vasireddy, Grier Mitts, MD  ferrous sulfate 325 (65 FE) MG tablet Take 1 tablet (325 mg total) by mouth daily. Patient will pick up scripts today. 03/18/19 06/16/19 Yes [name], NP  furosemide (LASIX) 80 MG tablet Take 1 tablet (80 mg total) by mouth 3 (three) times daily for 30 days. Patient will pick up scripts today. 03/18/19 04/17/19 Yes [name], NP  labetalol (NORMODYNE) 200 MG tablet Take 1 tablet (200 mg total) by mouth every morning. Patient will pick up scripts today. 03/18/19 06/16/19 Yes [name], NP  lisinopril (ZESTRIL) 40 MG tablet Take 1 tablet (40 mg total) by mouth every morning. Patient will pick up scripts today. 03/18/19 06/16/19 Yes [name], NP  metFORMIN (GLUCOPHAGE) 500 MG tablet Take 1 tablet (500 mg total) by mouth 2 (two) times a day. Patient will pick up scripts today. Patient taking differently: Take 500 mg by mouth daily  with breakfast. Patient will pick up scripts today. 03/18/19 06/16/19 Yes [name], NP  methocarbamol (ROBAXIN) 500 MG tablet Take 500 mg by mouth as needed for muscle spasms.   Yes [provider]  omeprazole (PRILOSEC) 40 MG capsule Take 1 capsule (40 mg total) by mouth every morning. Patient will pick up scripts today. 03/18/19 06/16/19 Yes [name], NP  polyethylene glycol (MIRALAX / GLYCOLAX) 17 g packet Take 17 g by mouth daily as needed for severe constipation. 03/01/19  Yes Vasireddy, Grier Mitts, MD  rivaroxaban (XARELTO) 20 MG TABS tablet Take 1 tablet (20 mg total) by mouth daily. Patient will pick up scripts today. 03/18/19 06/16/19 Yes [name], NP  spironolactone (ALDACTONE) 50 MG tablet Take 1 tablet (50 mg total) by mouth every morning. Patient will pick up scripts today. 03/18/19 06/16/19 Yes [name], NP  triamcinolone cream (KENALOG) 0.1 % Apply 1 application topically 3 (three) times daily as needed (rash/irritation).  02/01/19  Yes [provider]  Blood Glucose Monitoring Suppl (TRUE METRIX METER) w/Device KIT Use as instructed. Check blood glucose level by fingerstick twice per day. 03/18/19   [name], NP  carboxymethylcellulose (REFRESH PLUS) 0.5 % SOLN Place 1 drop into both eyes daily.     [provider]  glucose blood (TRUE METRIX BLOOD GLUCOSE TEST) test strip Use as instructed. Check blood glucose level by fingerstick twice per day. 03/18/19   [name], NP  TRUEplus Lancets 28G MISC Use as instructed. Check blood glucose level by fingerstick twice per day. 03/18/19   [name], NP    Past Medical History: Past Medical History:  Diagnosis Date  . A-fib ([place])   . Diabetes mellitus without complication ([place])   . Elevated troponin 02/06/2019  . Hypertension   . Obesity   . Pulmonary embolism ([place])   . Sleep apnea     Past Surgical History: Past Surgical History:  Procedure Laterality Date  .  COLONOSCOPY WITH PROPOFOL Left 02/25/2019   Procedure: COLONOSCOPY WITH PROPOFOL;  Surgeon: Arta Silence, MD;  Location: [facility];  Service: Endoscopy;  Laterality: Left;  . ESOPHAGOGASTRODUODENOSCOPY (EGD) WITH PROPOFOL Left 02/25/2019   Procedure: ESOPHAGOGASTRODUODENOSCOPY (EGD) WITH PROPOFOL;  Surgeon: Arta Silence, MD;  Location[facility] ENDOSCOPY;  Service: Endoscopy;  Laterality: Left;  . NO PAST SURGERIES      Family History: Family History  Problem Relation Age of Onset  . Hypertension Mother   . Diabetes Mother     Social History: Social History   Socioeconomic History  . Marital status: Married    Spouse name: Not on file  . Number of children: Not on file  . Years of education: Not on file  . Highest education level: Not  on file  Occupational History  . Not on file  Social Needs  . Financial resource strain: Not on file  . Food insecurity    Worry: Not on file    Inability: Not on file  . Transportation needs    Medical: Not on file    Non-medical: Not on file  Tobacco Use  . Smoking status: Former Research scientist (life sciences)  . Smokeless tobacco: Never Used  Substance and Sexual Activity  . Alcohol use: Not Currently  . Drug use: Not Currently  . Sexual activity: Not Currently  Lifestyle  . Physical activity    Days per week: Not on file    Minutes per session: Not on file  . Stress: Not on file  Relationships  . Social Herbalist on phone: Not on file    Gets together: Not on file    Attends religious service: Not on file    Active member of club or organization: Not on file    Attends meetings of clubs or organizations: Not on file    Relationship status: Not on file  Other Topics Concern  . Not on file  Social History Narrative  . Not on file    Allergies:  No Known Allergies  Objective:    Vital Signs:   Temp:  [97.6 F (36.4 C)-98.6 F (37 C)] 97.6 F (36.4 C) (07/28 1119) Pulse Rate:  [73-90] 87 (07/28 1119) Resp:  [18] 18 (07/28 0549)  BP: (106-146)/(79-89) 123/89 (07/28 1119) SpO2:  [89 %-96 %] 91 % (07/28 1119) Weight:  [185.8 kg] 185.8 kg (07/28 0549) Last BM Date: 04/16/19  Weight change: Filed Weights   04/14/19 1053 04/15/19 0500 04/16/19 0549  Weight: (!) 186.9 kg (!) 186 kg (!) 185.8 kg    Intake/Output:   Intake/Output Summary (Last 24 hours) at 04/16/2019 1232 Last data filed at 04/16/2019 0953 Gross per 24 hour  Intake 1360 ml  Output 2803 ml  Net -1443 ml      Physical Exam    General:   No resp difficulty. Sitting in the chair.  HEENT: normal Neck: supple. JVP difficult to assess due to body habitus. . Carotids 2+ bilat; no bruits. No lymphadenopathy or thyromegaly appreciated. Cor: PMI nondisplaced. Regular rate & rhythm. No rubs, gallops or murmurs. Lungs: clear Abdomen: soft, nontender, distended. No hepatosplenomegaly. No bruits or masses. Good bowel sounds. Extremities: no cyanosis, clubbing, rash, R and LLE 1+ edema Neuro: alert & orientedx3, cranial nerves grossly intact. moves all 4 extremities w/o difficulty. Affect pleasant   Telemetry   NSR 80s personally reviewed   EKG   SR 91 bpm    Labs   Basic Metabolic Panel: Recent Labs  Lab 04/12/19 0645 04/13/19 0654 04/14/19 0722 04/15/19 0641 04/16/19 0655  NA 137 137 135 135 135  K 4.0 3.7 4.2 3.9 4.1  CL 97* 97* 97* 92* 93*  CO2 _0 GLUCOSE 179* 137* 114* 140* 114*  BUN _1 27* 40*  CREATININE 1.24 1.30* 1.24 1.89* 2.38*  CALCIUM 9.4 8.8* 8.9 9.3 9.0  MG  --  1.9  --   --   --     Liver Function Tests: Recent Labs  Lab 04/12/19 1138  AST 19  ALT 14  ALKPHOS 51  BILITOT 0.2*  PROT 7.2  ALBUMIN 3.5   No results for input(s): LIPASE, AMYLASE in the last 168 hours. No results for input(s): AMMONIA in the last 168 hours.  CBC: Recent Labs  Lab 04/11/19 1338  WBC 8.2  NEUTROABS 4.4  HGB 11.0*  HCT 37.2*  MCV 81.2  PLT 400    Cardiac Enzymes: No results for input(s): CKTOTAL, CKMB,  CKMBINDEX, TROPONINI in the last 168 hours.  BNP: BNP (last 3 results) Recent Labs    03/11/19 1654 04/01/19 1902 04/11/19 1610  BNP 19.9 34.9 25.5    ProBNP (last 3 results) No results for input(s): PROBNP in the last 8760 hours.   CBG: Recent Labs  Lab 04/15/19 1113 04/15/19 1608 04/15/19 2227 04/16/19 0635 04/16/19 1116  GLUCAP 149* 101* 110* 106* 108*    Coagulation Studies: No results for input(s): LABPROT, INR in the last 72 hours.   Imaging    No results found.   Medications:     Current Medications: . atorvastatin  20 mg Oral QHS  . docusate sodium  100 mg Oral BID  . ferrous sulfate  325 mg Oral Daily  . insulin aspart  0-15 Units Subcutaneous TID WC  . insulin aspart  0-5 Units Subcutaneous QHS  . ipratropium  2 puff Inhalation Q8H  . labetalol  200 mg Oral q morning - 10a  . levalbuterol  2 puff Inhalation Q8H  . pantoprazole  40 mg Oral Daily  . polyvinyl alcohol  2 drop Both Eyes BID  . rivaroxaban  20 mg Oral Daily  . sodium chloride flush  3 mL Intravenous Q12H     Infusions: . sodium chloride         Patient Profile   Mr Eskelson is a 53 year old with history of  Chronic diastolic heart failure, HTN, Hyperlipidemia, DM, GERD, A Fib, OSA, anemia, and PE   01/2019.     Assessment/Plan   1. A/C Diastolic HF ECHO preserved EF. Diuresed with lasix drip but cut back to intermittent lasix with creatinine bump.  - Will need RHC to further assess hemodynamics. May need to add milrinone if he is overloaded on cath.  - Can adjust diuretics after cath.  - Give 80 mg IV lasix this afternoon. Bladder scan - negative for urinary retention.  - RHC in am.  - Add unna boots.   2. AKI Creatinine trending up with diuresis.  BMET in the morning.   3. PAF In NSR. Continue BB + xarelto   4. OSA Continue CPAP   5. Obesity  Body mass index is 57.13 kg/m.   6. H/O PE 01/2019    Medication concerns reviewed with patient and pharmacy  team. Barriers identified: He is at high for readmits. Will likely need HF Paramedicine.   Length of Stay: [place], NP  04/16/2019, 12:32 PM  Advanced Heart Failure Team Pager [phone] (M-F; 7a - 4p)  Please contact [facility] for night-coverage after hours (4p -7a ) and weekends on [email or web address]  Patient seen with NP, agree with the above note.    He has a history of recent COVID-19 infection in 6/20 as well as a PE in 4/20.  He has paroxysmal atrial fibrillation, OSA, and diastolic CHF in the setting of marked obesity.    He was admitted with lower extremity and scrotal swelling.  Echo in 5/20 showed EF 60-65% but RV was poorly visualized.  He was initially diuresed with Lasix gtt 10 mg/hr and weight dropped 10 lbs.  However, creatinine increased from 1.24 => 2.38 and he was transitioned to Lasix 40 mg IV tid.  Since then, he feels like his legs  have become more tight.  He has been in NSR.   General: NAD, morbid obesity.  Neck: JVP difficult appears 10-12 cm, no thyromegaly or thyroid nodule.  Lungs: Clear to auscultation bilaterally with normal respiratory effort. CV: nonpalpable PMI.  Heart regular S1/S2, no S3/S4, no murmur.  1+ chronic edema to thighs.  No carotid bruit.  Unable to palpate pedal pulses.  Abdomen: Soft, nontender, no hepatosplenomegaly, no distention.  Skin: Intact without lesions or rashes.  Neurologic: Alert and oriented x 3.  Psych: Normal affect. Extremities: No clubbing or cyanosis.  HEENT: Normal.   Exam is difficult for volume, he does not have marked JVD but has significant peripheral edema. Creatinine has bumped significantly with diuresis.  I suspect significant RV dysfunction in setting of OSA, unfortunately unable to see the RV well on last echo.   - Change Lasix to 80 mg IV bid for today.  - RHC in morning to determine how much further diuresis he needs and assess PA pressure.  We discussed risks/benefits and he agrees to procedure.  Hold am  Xarelto.   Loralie Champagne 04/16/2019 4:21 PM

## 2019-04-16 NOTE — Progress Notes (Signed)
PROGRESS NOTE  Benjamin [name] BTD:[id number] DOB: 07-28-1966 DOA: 04/11/2019 PCP: No primary care provider on file.  HPI/Recap of past 24 hours: Benjamin [name]  is a 53 y.o. male, with medical history significant ofdCHF,hypertension, hyperlipidemia, diabetes mellitus, GERD, atrial fibrillationandPE on Xarelto,OSA on CPAP, iron deficiency anemia, morbid obesity, chronic elevation of troponin , as well recent COVID-19 infection. Patient presents secondary to worsening lower extremity edema and scrotal edema, she had recent ED visit for the same, patient report he has been compliant with his medications, including Lasix 80 mg 3 times a day, reports his legs feel very heavy, and uncomfortable, as well as scrotum and significantly swollen as well limiting his movement, upon further questioning, patient report he is compliant with fluid restriction, salt restriction, reports mildly worsening dyspnea, mainly upon laying supine, denies any chest pain, cough, fever, chills, nausea, vomiting, hemoptysis, diarrhea, or blood per rectum. - in ED no significant labs abnormalities, oxygen requirement at baseline, but patient was noted to have significantly swollen scrotum and +2 lower extremity edema, received 80 mg IV Lasix as I was called to admit for volume overload.  Started on lasix drip and ongoing use with improvement of volume status.  04/16/19: Patient was seen and examined at his bedside this morning.  No acute events overnight.  States he feels like fluid is reaccumulating with current diuretics dose.  Denies chest pain or dyspnea at rest.     Assessment/Plan: Active Problems:   PE (pulmonary thromboembolism) ([place])   Diabetes mellitus type 2 in obese (HCC)   Bilateral lower extremity edema   Obesity, Class III, BMI 40-49.9 (morbid obesity) (HCC)   Chronic diastolic CHF (congestive heart failure) (HCC)   PAF (paroxysmal atrial fibrillation) ([place])   COVID-19 virus infection   OSA on CPAP    HLD (hyperlipidemia)   GERD (gastroesophageal reflux disease)   Acute on chronic diastolic CHF (congestive heart failure) (HCC)   Severe Anasarca/volume overload possibly secondary to acute on chronic diastolic CHF  Presented with significant volume overload with 3+ pitting edema in bilateral lower extremities, significant scrotal edema Last CMP 03/12/2019 with normal LFTs. Started on Lasix drip, Lasix drip Dc'd on 04/15/2019 IV Lasix 40 mg 3 times daily held by cardiology Continue strict I's and O's and daily weight Net I&O -11.6L since admission Continues to improve but still significantly fluid overload. Closely monitor urine output and renal function Continue salt restriction less than 2 g/day Continue fluid restriction less than 1200 cc/day  Ongoing diuresing  Worsening AKI on CKD 2 Baseline creatinine appears to be 1.2 with GFR greater than 60 Creatinine on 04/15/19 was 1.89>> 2.38 on 7/28 Lasix stopped by cardiology Daily BMPs  Paroxysmal A. Fib Currently in sinus rhythm Rate controlled, on labetalol On Xarelto for CVA prevention and history of PE DVT  Scrotal edema/Large left spermatocele? with tenderness likely secondary to fluid overload If no improvement of scrotal edema with Lasix drip we will consult urology Afebrile with no leukocytosis Scrotal US revealed: Normal appearing testes with small BILATERAL hydroceles. Loculated cysts adjacent to LEFT testis question spermatoceles versus epididymal cysts largest 5.6 cm in greatest size. Curbsided with urology Dr.Wrenn who looked at the labs and [place]. We will continue to diurese, if his edema resolves and his cardiac risk is acceptable and still symptomatic he could be considered for surgical repair however he may not be a good candidate.  Acute on chronic diastolic CHF BNP 25. Last BNP 34 from 04/01/2019 Completed Lasix drip and  1 dose xaroxylyn Continue strict I's and O's and daily weight  Acute on chronic hypoxic  respiratory failure likely multifactorial in the setting of fluid overload, improving  On oxygen supplementation 2L at baseline Continue bronchodilators Maintain O2 saturation greater than 92% Home O2 eval when close to dc  Recent COVID-19 infection First dx positive covid-19 in 02/06/19, admitted to [facility] and discharged after 6 days on O2, recommended cardiology follow up. Afebrile Denies chest pain or dyspnea or cough.  Noted prior positive COVID-19 on 03/11/2019, repeated COVID-19 test negative on 04/11/2019.  Independently reviewed chest x-ray done on admission which showed markings similar to prior chest x-ray done on 04/01/19, improved from cxr done on 03/11/19, markings more prominent at bases.  Inflammatory markers trended down Negative covid x 2 this admission including 1 negative send out test VS reviewed and are stable  Resolved Uncontrolled hypertension BP is at goal Continue antihypertensives Continue IV hydralazine PRN for SBP greater than 160  Severe Morbid obesity BMI 57 Recommend weight loss outpatient with regular physical activity and healthy diet once stable  Type 2 diabetes with hyperglycemia Last hemoglobin A1c 7.6 on 01/23/2019 Continue insulin coverage  History of PE and DVT Continue xarelto Maintain O2 saturation greater than 92%  OSA Continue CPAP at night   Code Status: Full code   Family Communication: We will call family if okay with the patient.  Disposition Plan: Home possibly in 2 to 3 days when volume status is close to baseline and cardiology signs off.   Consultants:  Cardiology  Curbsided with urology, Dr. Annabell Howells on 04/14/19. Not a formal consult.  Procedures:  None   Antimicrobials:  None  DVT prophylaxis:  Xarelto   Objective: Vitals:   04/15/19 2249 04/16/19 0549 04/16/19 1119 04/16/19 1457  BP: 106/83 (!) 146/85 123/89   Pulse: 90 87 87 89  Resp: 18 18  18   Temp: 98.5 F (36.9 C) 98.6 F (37 C) 97.6 F (36.4 C)    TempSrc: Oral Oral Oral   SpO2: 96% (!) 89% 91% 93%  Weight:  (!) 185.8 kg    Height:        Intake/Output Summary (Last 24 hours) at 04/16/2019 1540 Last data filed at 04/16/2019 1300 Gross per 24 hour  Intake 1400 ml  Output 2803 ml  Net -1403 ml   Filed Weights   04/14/19 1053 04/15/19 0500 04/16/19 0549  Weight: (!) 186.9 kg (!) 186 kg (!) 185.8 kg    Exam:  . General: 53 y.o. year-old male morbidly obese in no acute distress.  Alert and oriented x3.   . Cardiovascular: Regular rate and rhythm no rubs or gallops no JVD no thyromegaly noted.   Marland Kitchen Respiratory: Clear to auscultation no wheezes no RALES.  Poor inspiratory effort.   . Abdomen: Morbidly obese nontender nondistended normal bowel sounds present. . Musculoskeletal: 2+ pitting edema lower extremities bilaterally. Marland Kitchen Psychiatry: Mood is appropriate for condition and setting.   Data Reviewed: CBC: Recent Labs  Lab 04/11/19 1338  WBC 8.2  NEUTROABS 4.4  HGB 11.0*  HCT 37.2*  MCV 81.2  PLT 400   Basic Metabolic Panel: Recent Labs  Lab 04/12/19 0645 04/13/19 0654 04/14/19 0722 04/15/19 0641 04/16/19 0655  NA 137 137 135 135 135  K 4.0 3.7 4.2 3.9 4.1  CL 97* 97* 97* 92* 93*  CO2 28 30 28 30 29   GLUCOSE 179* 137* 114* 140* 114*  BUN 11 16 17  27* 40*  CREATININE 1.24 1.30*  1.24 1.89* 2.38*  CALCIUM 9.4 8.8* 8.9 9.3 9.0  MG  --  1.9  --   --   --    GFR: Estimated Creatinine Clearance: 60.7 mL/min (A) (by C-G formula based on SCr of 2.38 mg/dL (H)). Liver Function Tests: Recent Labs  Lab 04/12/19 1138  AST 19  ALT 14  ALKPHOS 51  BILITOT 0.2*  PROT 7.2  ALBUMIN 3.5   No results for input(s): LIPASE, AMYLASE in the last 168 hours. No results for input(s): AMMONIA in the last 168 hours. Coagulation Profile: No results for input(s): INR, PROTIME in the last 168 hours. Cardiac Enzymes: No results for input(s): CKTOTAL, CKMB, CKMBINDEX, TROPONINI in the last 168 hours. BNP (last 3 results) No  results for input(s): PROBNP in the last 8760 hours. HbA1C: No results for input(s): HGBA1C in the last 72 hours. CBG: Recent Labs  Lab 04/15/19 1113 04/15/19 1608 04/15/19 2227 04/16/19 0635 04/16/19 1116  GLUCAP 149* 101* 110* 106* 108*   Lipid Profile: No results for input(s): CHOL, HDL, LDLCALC, TRIG, CHOLHDL, LDLDIRECT in the last 72 hours. Thyroid Function Tests: No results for input(s): TSH, T4TOTAL, FREET4, T3FREE, THYROIDAB in the last 72 hours. Anemia Panel: No results for input(s): VITAMINB12, FOLATE, FERRITIN, TIBC, IRON, RETICCTPCT in the last 72 hours. Urine analysis:    Component Value Date/Time   COLORURINE STRAW (A) 04/13/2019 0044   APPEARANCEUR CLEAR 04/13/2019 0044   LABSPEC 1.009 04/13/2019 0044   PHURINE 5.0 04/13/2019 0044   GLUCOSEU NEGATIVE 04/13/2019 0044   HGBUR NEGATIVE 04/13/2019 0044   BILIRUBINUR NEGATIVE 04/13/2019 0044   KETONESUR NEGATIVE 04/13/2019 0044   PROTEINUR NEGATIVE 04/13/2019 0044   NITRITE NEGATIVE 04/13/2019 0044   LEUKOCYTESUR NEGATIVE 04/13/2019 0044   Sepsis Labs: @LABRCNTIP (procalcitonin:4,lacticidven:4)  ) Recent Results (from the past 240 hour(s))  SARS Coronavirus 2 (CEPHEID - [facility]formed [facility]), Hosp Order     Status: None   Collection Time: 04/11/19  6:54 PM   Specimen: Nasopharyngeal Swab  Result Value Ref Range Status   SARS Coronavirus 2 NEGATIVE NEGATIVE Final    Comment: (NOTE) If result is NEGATIVE SARS-CoV-2 target nucleic acids are NOT DETECTED. The SARS-CoV-2 RNA is generally detectable in upper and lower  respiratory specimens during the acute phase of infection. The lowest  concentration of SARS-CoV-2 viral copies this assay can detect is 250  copies / mL. A negative result does not preclude SARS-CoV-2 infection  and should not be used as the sole basis for treatment or other  patient management decisions.  A negative result may occur with  improper specimen collection /  handling, submission of specimen other  than nasopharyngeal swab, presence of viral mutation(s) within the  areas targeted by this assay, and inadequate number of viral copies  (<250 copies / mL). A negative result must be combined with clinical  observations, patient history, and epidemiological information. If result is POSITIVE SARS-CoV-2 target nucleic acids are DETECTED. The SARS-CoV-2 RNA is generally detectable in upper and lower  respiratory specimens dur ing the acute phase of infection.  Positive  results are indicative of active infection with SARS-CoV-2.  Clinical  correlation with patient history and other diagnostic information is  necessary to determine patient infection status.  Positive results do  not rule out bacterial infection or co-infection with other viruses. If result is PRESUMPTIVE POSTIVE SARS-CoV-2 nucleic acids MAY BE PRESENT.   A presumptive positive result was obtained on the submitted specimen  and confirmed on  repeat testing.  While 2019 novel coronavirus  (SARS-CoV-2) nucleic acids may be present in the submitted sample  additional confirmatory testing may be necessary for epidemiological  and / or clinical management purposes  to differentiate between  SARS-CoV-2 and other Sarbecovirus currently known to infect humans.  If clinically indicated additional testing with an alternate test  methodology [phone]([id number]) is advised. The SARS-CoV-2 RNA is generally  detectable in upper and lower respiratory sp ecimens during the acute  phase of infection. The expected result is Negative. Fact Sheet for Patients:  BoilerBrush.com.cyhttps://www.fda.gov/media/136312/download Fact Sheet for Healthcare Providers: https://pope.com/https://www.fda.gov/media/136313/download This test is not yet approved or cleared by the [place] FDA and has been authorized for detection and/or diagnosis of SARS-CoV-2 by FDA under an Emergency Use Authorization (EUA).  This EUA will remain in effect (meaning this  test can be used) for the duration of the COVID-19 declaration under Section 564(b)(1) of the Act, 21 U.S.C. section 360bbb-3(b)(1), unless the authorization is terminated or revoked sooner. Performed at [facility], [name] [place], [place] [address]   Novel Coronavirus,NAA,(SEND-OUT TO REF LAB - TAT 24-48 hrs); Hosp Order     Status: None   Collection Time: 04/12/19  4:03 PM   Specimen: Nasopharyngeal Swab; Respiratory  Result Value Ref Range Status   SARS-CoV-2, NAA NOT DETECTED NOT DETECTED Final    Comment: (NOTE) This test was developed and its performance characteristics determined by [facility]. This test has not been FDA cleared or approved. This test has been authorized by FDA under an Emergency Use Authorization (EUA). This test is only authorized for the duration of time the declaration that circumstances exist justifying the authorization of the emergency use of in vitro diagnostic tests for detection of SARS-CoV-2 virus and/or diagnosis of COVID-19 infection under section 564(b)(1) of the Act, 21 U.S.C. 811BJY-7(W)(2360bbb-3(b)(1), unless the authorization is terminated or revoked sooner. When diagnostic testing is negative, the possibility of a false negative result should be considered in the context of a patient's recent exposures and the presence of clinical signs and symptoms consistent with COVID-19. An individual without symptoms of COVID-19 and who is not shedding SARS-CoV-2 virus would expect to have a negative (not detected) result in this assay. Performed  At: [facility] [place] [address] [place], [place] [id number] Jolene SchimkeNagendra Sanjai MD VH:[phone]    Coronavirus Source NASOPHARYNGEAL  Final    Comment: Performed at [facility], [address] [place], [place] [address]      Studies: No results found.  Scheduled Meds: . atorvastatin  20 mg Oral QHS  . docusate sodium  100 mg Oral BID  . ferrous sulfate  325 mg Oral Daily   . insulin aspart  0-15 Units Subcutaneous TID WC  . insulin aspart  0-5 Units Subcutaneous QHS  . ipratropium  2 puff Inhalation Q8H  . labetalol  200 mg Oral BID  . levalbuterol  2 puff Inhalation Q8H  . pantoprazole  40 mg Oral Daily  . polyvinyl alcohol  2 drop Both Eyes BID  . rivaroxaban  20 mg Oral Daily  . sodium chloride flush  3 mL Intravenous Q12H    Continuous Infusions: . sodium chloride       LOS: 5 days     Darlin Droparole N Jaionna Weisse, MD [facility] Pager [phone]  If 7PM-7AM, please contact night-coverage [email or web address] Password [email or web address] 04/16/2019, 3:40 PM

## 2019-04-16 NOTE — Progress Notes (Signed)
PT asked about cpap, patient states he does not need one.

## 2019-04-16 NOTE — Progress Notes (Signed)
RN to reschedule IV consult for later tonight for heart cath due around 7am tomorrow morning

## 2019-04-16 NOTE — Consult Note (Addendum)
Advanced Heart Failure Team Consult Note   Primary Physician: No primary care provider on file. PCP-Cardiologist:  Dorris Carnes, MD  Reason for Consultation: Heart Failure   HPI:    Benjamin [name] is seen today for evaluation of heart failure at the request of Dr Angelena Form.   MAdmitted 01/21/2019 with increased dyspnea and had PE. He was placed on back on coumadin .  Patient re-admitted 5/20 for syncope/cough/dyspnea, COVID+, CT positive for bilateral GGOs, admitted to [place], discharged after 6 days, on O2.  Rec'd Cards f/u.  Patient re-admitted 6/3 for bright red blood per rectum, eval'ed by GI, had EGD/colon, dx'd with hemorrhoidal bleeding, discharged after 9 days on O2. He was again Covid + .  Readmitted 03/11/19 with increased shortness of breath. He did not have home oxygen. He was discharged to home the next day.   He presented to [facility] ED 04/11/19 with lower extremity edema and scrotal edema. CXR with no acute disease. Pertinent admission labs: Covid 19 negative, BNP 25, HS Trop <18, K 4, creatinine 1.05, and Hgb 11. Placed on lasix drip 10 mg per hour and later switched to lasix 40 mg IV tid due to creatinine bump. Weight has come down from 419>409.6 pounds.    Echo 01/2019:EF 60-65%, grade I DD  Review of Systems: [y] = yes, _0  = no   . General: Weight gain [Y ]; Weight loss _1 ; Anorexia _2 ; Fatigue [Y ]; Fever _3 ; Chills _4 ; Weakness _5   . Cardiac: Chest pain/pressure _6 ; Resting SOB _7 ; Exertional SOB [Y ]; Orthopnea [name] ]; Pedal Edema [Y ]; Palpitations _8 ; Syncope _9 ; Presyncope _10 ; Paroxysmal nocturnal dyspnea_11   . Pulmonary: Cough _12 ; Wheezing_13 ; Hemoptysis_14 ; Sputum _15 ; Snoring _16   . GI: Vomiting_17 ; Dysphagia_18 ; Melena_19 ; Hematochezia _20 ; Heartburn_21 ; Abdominal pain _22 ; Constipation _23 ; Diarrhea _24 ; BRBPR _25   . GU: Hematuria_26 ; Dysuria _27 ; Nocturia_28   . Vascular: Pain in legs with walking _29 ; Pain in feet with lying flat _30 ; Non-healing sores  _31 ; Stroke _32 ; TIA _33 ; Slurred speech _34 ;  . Neuro: Headaches_35 ; Vertigo_36 ; Seizures_37 ; Paresthesias_38 ;Blurred vision _39 ; Diplopia _40 ; Vision changes _41   . Ortho/Skin: Arthritis _42 ; Joint pain [ Y]; Muscle pain _43 ; Joint swelling _44 ; Back Pain [Y ]; Rash _45   . Psych: Depression_46 ; Anxiety_47   . Heme: Bleeding problems _48 ; Clotting disorders _49 ; Anemia _50   . Endocrine: Diabetes _51 ; Thyroid dysfunction_52   Home Medications Prior to Admission medications   Medication Sig Start Date End Date Taking? Authorizing Provider  amLODipine (NORVASC) 10 MG tablet Take 1 tablet (10 mg total) by mouth every morning. Patient will pick up scripts today. 03/18/19 06/16/19 Yes [name], NP  atorvastatin (LIPITOR) 20 MG tablet Take 1 tablet (20 mg total) by mouth at bedtime. Patient will pick up scripts today. Patient taking differently: Take 20 mg by mouth at bedtime.  03/18/19 06/16/19 Yes [name], NP  docusate sodium (COLACE) 100 MG capsule Take 1 capsule (100 mg total) by mouth 2 (two) times daily. 03/01/19  Yes Vasireddy, Grier Mitts, MD  ferrous sulfate 325 (65 FE) MG tablet Take 1 tablet (325 mg total) by mouth daily. Patient will pick up scripts today. 03/18/19 06/16/19 Yes [name], NP  furosemide (LASIX) 80 MG tablet Take 1 tablet (80 mg total) by mouth 3 (three) times daily for 30 days. Patient will pick up scripts today. 03/18/19 04/17/19 Yes [name], NP  labetalol (NORMODYNE) 200 MG tablet Take 1 tablet (200 mg total) by mouth every morning. Patient will pick up scripts today. 03/18/19 06/16/19 Yes [name], NP  lisinopril (ZESTRIL) 40 MG tablet Take 1 tablet (40 mg total) by mouth every morning. Patient will pick up scripts today. 03/18/19 06/16/19 Yes [name], NP  metFORMIN (GLUCOPHAGE) 500 MG tablet Take 1 tablet (500 mg total) by mouth 2 (two) times a day. Patient will pick up scripts today. Patient taking differently: Take 500 mg by mouth daily  with breakfast. Patient will pick up scripts today. 03/18/19 06/16/19 Yes [name], NP  methocarbamol (ROBAXIN) 500 MG tablet Take 500 mg by mouth as needed for muscle spasms.   Yes [provider]  omeprazole (PRILOSEC) 40 MG capsule Take 1 capsule (40 mg total) by mouth every morning. Patient will pick up scripts today. 03/18/19 06/16/19 Yes [name], NP  polyethylene glycol (MIRALAX / GLYCOLAX) 17 g packet Take 17 g by mouth daily as needed for severe constipation. 03/01/19  Yes Vasireddy, Grier Mitts, MD  rivaroxaban (XARELTO) 20 MG TABS tablet Take 1 tablet (20 mg total) by mouth daily. Patient will pick up scripts today. 03/18/19 06/16/19 Yes [name], NP  spironolactone (ALDACTONE) 50 MG tablet Take 1 tablet (50 mg total) by mouth every morning. Patient will pick up scripts today. 03/18/19 06/16/19 Yes [name], NP  triamcinolone cream (KENALOG) 0.1 % Apply 1 application topically 3 (three) times daily as needed (rash/irritation).  02/01/19  Yes [provider]  Blood Glucose Monitoring Suppl (TRUE METRIX METER) w/Device KIT Use as instructed. Check blood glucose level by fingerstick twice per day. 03/18/19   [name], NP  carboxymethylcellulose (REFRESH PLUS) 0.5 % SOLN Place 1 drop into both eyes daily.     [provider]  glucose blood (TRUE METRIX BLOOD GLUCOSE TEST) test strip Use as instructed. Check blood glucose level by fingerstick twice per day. 03/18/19   [name], NP  TRUEplus Lancets 28G MISC Use as instructed. Check blood glucose level by fingerstick twice per day. 03/18/19   [name], NP    Past Medical History: Past Medical History:  Diagnosis Date  . A-fib ([place])   . Diabetes mellitus without complication ([place])   . Elevated troponin 02/06/2019  . Hypertension   . Obesity   . Pulmonary embolism ([place])   . Sleep apnea     Past Surgical History: Past Surgical History:  Procedure Laterality Date  .  COLONOSCOPY WITH PROPOFOL Left 02/25/2019   Procedure: COLONOSCOPY WITH PROPOFOL;  Surgeon: Arta Silence, MD;  Location: [facility];  Service: Endoscopy;  Laterality: Left;  . ESOPHAGOGASTRODUODENOSCOPY (EGD) WITH PROPOFOL Left 02/25/2019   Procedure: ESOPHAGOGASTRODUODENOSCOPY (EGD) WITH PROPOFOL;  Surgeon: Arta Silence, MD;  Location[facility] ENDOSCOPY;  Service: Endoscopy;  Laterality: Left;  . NO PAST SURGERIES      Family History: Family History  Problem Relation Age of Onset  . Hypertension Mother   . Diabetes Mother     Social History: Social History   Socioeconomic History  . Marital status: Married    Spouse name: Not on file  . Number of children: Not on file  . Years of education: Not on file  . Highest education level: Not  on file  Occupational History  . Not on file  Social Needs  . Financial resource strain: Not on file  . Food insecurity    Worry: Not on file    Inability: Not on file  . Transportation needs    Medical: Not on file    Non-medical: Not on file  Tobacco Use  . Smoking status: Former Research scientist (life sciences)  . Smokeless tobacco: Never Used  Substance and Sexual Activity  . Alcohol use: Not Currently  . Drug use: Not Currently  . Sexual activity: Not Currently  Lifestyle  . Physical activity    Days per week: Not on file    Minutes per session: Not on file  . Stress: Not on file  Relationships  . Social Herbalist on phone: Not on file    Gets together: Not on file    Attends religious service: Not on file    Active member of club or organization: Not on file    Attends meetings of clubs or organizations: Not on file    Relationship status: Not on file  Other Topics Concern  . Not on file  Social History Narrative  . Not on file    Allergies:  No Known Allergies  Objective:    Vital Signs:   Temp:  [97.6 F (36.4 C)-98.6 F (37 C)] 97.6 F (36.4 C) (07/28 1119) Pulse Rate:  [73-90] 87 (07/28 1119) Resp:  [18] 18 (07/28 0549)  BP: (106-146)/(79-89) 123/89 (07/28 1119) SpO2:  [89 %-96 %] 91 % (07/28 1119) Weight:  [185.8 kg] 185.8 kg (07/28 0549) Last BM Date: 04/16/19  Weight change: Filed Weights   04/14/19 1053 04/15/19 0500 04/16/19 0549  Weight: (!) 186.9 kg (!) 186 kg (!) 185.8 kg    Intake/Output:   Intake/Output Summary (Last 24 hours) at 04/16/2019 1232 Last data filed at 04/16/2019 0953 Gross per 24 hour  Intake 1360 ml  Output 2803 ml  Net -1443 ml      Physical Exam    General:   No resp difficulty. Sitting in the chair.  HEENT: normal Neck: supple. JVP difficult to assess due to body habitus. . Carotids 2+ bilat; no bruits. No lymphadenopathy or thyromegaly appreciated. Cor: PMI nondisplaced. Regular rate & rhythm. No rubs, gallops or murmurs. Lungs: clear Abdomen: soft, nontender, distended. No hepatosplenomegaly. No bruits or masses. Good bowel sounds. Extremities: no cyanosis, clubbing, rash, R and LLE 1+ edema Neuro: alert & orientedx3, cranial nerves grossly intact. moves all 4 extremities w/o difficulty. Affect pleasant   Telemetry   NSR 80s personally reviewed   EKG   SR 91 bpm    Labs   Basic Metabolic Panel: Recent Labs  Lab 04/12/19 0645 04/13/19 0654 04/14/19 0722 04/15/19 0641 04/16/19 0655  NA 137 137 135 135 135  K 4.0 3.7 4.2 3.9 4.1  CL 97* 97* 97* 92* 93*  CO2 _0 GLUCOSE 179* 137* 114* 140* 114*  BUN _1 27* 40*  CREATININE 1.24 1.30* 1.24 1.89* 2.38*  CALCIUM 9.4 8.8* 8.9 9.3 9.0  MG  --  1.9  --   --   --     Liver Function Tests: Recent Labs  Lab 04/12/19 1138  AST 19  ALT 14  ALKPHOS 51  BILITOT 0.2*  PROT 7.2  ALBUMIN 3.5   No results for input(s): LIPASE, AMYLASE in the last 168 hours. No results for input(s): AMMONIA in the last 168 hours.  CBC: Recent Labs  Lab 04/11/19 1338  WBC 8.2  NEUTROABS 4.4  HGB 11.0*  HCT 37.2*  MCV 81.2  PLT 400    Cardiac Enzymes: No results for input(s): CKTOTAL, CKMB,  CKMBINDEX, TROPONINI in the last 168 hours.  BNP: BNP (last 3 results) Recent Labs    03/11/19 1654 04/01/19 1902 04/11/19 1610  BNP 19.9 34.9 25.5    ProBNP (last 3 results) No results for input(s): PROBNP in the last 8760 hours.   CBG: Recent Labs  Lab 04/15/19 1113 04/15/19 1608 04/15/19 2227 04/16/19 0635 04/16/19 1116  GLUCAP 149* 101* 110* 106* 108*    Coagulation Studies: No results for input(s): LABPROT, INR in the last 72 hours.   Imaging    No results found.   Medications:     Current Medications: . atorvastatin  20 mg Oral QHS  . docusate sodium  100 mg Oral BID  . ferrous sulfate  325 mg Oral Daily  . insulin aspart  0-15 Units Subcutaneous TID WC  . insulin aspart  0-5 Units Subcutaneous QHS  . ipratropium  2 puff Inhalation Q8H  . labetalol  200 mg Oral q morning - 10a  . levalbuterol  2 puff Inhalation Q8H  . pantoprazole  40 mg Oral Daily  . polyvinyl alcohol  2 drop Both Eyes BID  . rivaroxaban  20 mg Oral Daily  . sodium chloride flush  3 mL Intravenous Q12H     Infusions: . sodium chloride         Patient Profile   Benjamin [name] is a 53 year old with history of  Chronic diastolic heart failure, HTN, Hyperlipidemia, DM, GERD, A Fib, OSA, anemia, and PE   01/2019.     Assessment/Plan   1. A/C Diastolic HF ECHO preserved EF. Diuresed with lasix drip but cut back to intermittent lasix with creatinine bump.  - Will need RHC to further assess hemodynamics. May need to add milrinone if he is overloaded on cath.  - Can adjust diuretics after cath.  - Give 80 mg IV lasix this afternoon. Bladder scan - negative for urinary retention.  - RHC in am.  - Add unna boots.   2. AKI Creatinine trending up with diuresis.  BMET in the morning.   3. PAF In NSR. Continue BB + xarelto   4. OSA Continue CPAP   5. Obesity  Body mass index is 57.13 kg/m.   6. H/O PE 01/2019    Medication concerns reviewed with patient and pharmacy  team. Barriers identified: He is at high for readmits. Will likely need HF Paramedicine.   Length of Stay: [place], NP  04/16/2019, 12:32 PM  Advanced Heart Failure Team Pager [phone] (M-F; 7a - 4p)  Please contact [facility] for night-coverage after hours (4p -7a ) and weekends on [email or web address]  Patient seen with NP, agree with the above note.    He has a history of recent COVID-19 infection in 6/20 as well as a PE in 4/20.  He has paroxysmal atrial fibrillation, OSA, and diastolic CHF in the setting of marked obesity.    He was admitted with lower extremity and scrotal swelling.  Echo in 5/20 showed EF 60-65% but RV was poorly visualized.  He was initially diuresed with Lasix gtt 10 mg/hr and weight dropped 10 lbs.  However, creatinine increased from 1.24 => 2.38 and he was transitioned to Lasix 40 mg IV tid.  Since then, he feels like his legs  have become more tight.  He has been in NSR.   General: NAD, morbid obesity.  Neck: JVP difficult appears 10-12 cm, no thyromegaly or thyroid nodule.  Lungs: Clear to auscultation bilaterally with normal respiratory effort. CV: nonpalpable PMI.  Heart regular S1/S2, no S3/S4, no murmur.  1+ chronic edema to thighs.  No carotid bruit.  Unable to palpate pedal pulses.  Abdomen: Soft, nontender, no hepatosplenomegaly, no distention.  Skin: Intact without lesions or rashes.  Neurologic: Alert and oriented x 3.  Psych: Normal affect. Extremities: No clubbing or cyanosis.  HEENT: Normal.   Exam is difficult for volume, he does not have marked JVD but has significant peripheral edema. Creatinine has bumped significantly with diuresis.  I suspect significant RV dysfunction in setting of OSA, unfortunately unable to see the RV well on last echo.   - Change Lasix to 80 mg IV bid for today.  - RHC in morning to determine how much further diuresis he needs and assess PA pressure.  We discussed risks/benefits and he agrees to procedure.  Hold am  Xarelto.   Loralie Champagne 04/16/2019 4:21 PM

## 2019-04-16 NOTE — Research (Signed)
PHDEV Informed Consent   Subject Name: Benjamin [name]  Subject met inclusion and exclusion criteria.  The informed consent form, study requirements and expectations were reviewed with the subject and questions and concerns were addressed prior to the signing of the consent form.  The subject verbalized understanding of the trail requirements.  The subject agreed to participate in the PHDEV trial and signed the informed consent.  The informed consent was obtained prior to performance of any protocol-specific procedures for the subject.  A copy of the signed informed consent was given to the subject and a copy was placed in the subject's medical record.  ,  D 04/16/2019, 1639pm  

## 2019-04-16 NOTE — Progress Notes (Signed)
Orthopedic Tech Progress Note Patient Details:  LEAMON PALAU April 28, 1966 [id number]  Ortho Devices Type of Ortho Device: Haematologist Ortho Device/Splint Location: bilateral Ortho Device/Splint Interventions: Ordered, Application, Adjustment   Post Interventions Patient Tolerated: Well Instructions Provided: Care of device, Adjustment of device   Janit Pagan 04/16/2019, 3:17 PM

## 2019-04-16 NOTE — Progress Notes (Addendum)
Progress Note  Patient Name: Benjamin [name] Date of Encounter: 04/16/2019  Primary Cardiologist: Dietrich PatesPaula Ross, MD   Subjective   Patient says he is feeling worse, like he did when he was admitted. States scrotum is swelling back up. He has having difficulty urinating. Has SOB on exertion; 2L O2 at baseline but patient is only using it as needed.   Inpatient Medications    Scheduled Meds: . atorvastatin  20 mg Oral QHS  . docusate sodium  100 mg Oral BID  . ferrous sulfate  325 mg Oral Daily  . furosemide  40 mg Intravenous TID  . insulin aspart  0-15 Units Subcutaneous TID WC  . insulin aspart  0-5 Units Subcutaneous QHS  . ipratropium  2 puff Inhalation Q8H  . labetalol  200 mg Oral q morning - 10a  . levalbuterol  2 puff Inhalation Q8H  . pantoprazole  40 mg Oral Daily  . polyvinyl alcohol  2 drop Both Eyes BID  . potassium chloride  40 mEq Oral Daily  . rivaroxaban  20 mg Oral Daily  . sodium chloride flush  3 mL Intravenous Q12H   Continuous Infusions: . sodium chloride     PRN Meds: sodium chloride, acetaminophen, hydrALAZINE, methocarbamol, ondansetron (ZOFRAN) IV, oxyCODONE, polyethylene glycol, sodium chloride flush   Vital Signs    Vitals:   04/15/19 1718 04/15/19 1951 04/15/19 2249 04/16/19 0549  BP: 138/79  106/83 (!) 146/85  Pulse: 73  90 87  Resp: 18  18 18   Temp:   98.5 F (36.9 C) 98.6 F (37 C)  TempSrc:   Oral Oral  SpO2:  94% 96% (!) 89%  Weight:    (!) 185.8 kg  Height:        Intake/Output Summary (Last 24 hours) at 04/16/2019 0920 Last data filed at 04/16/2019 0914 Gross per 24 hour  Intake 1560 ml  Output 2250 ml  Net -690 ml   Last 3 Weights 04/16/2019 04/15/2019 04/14/2019  Weight (lbs) 409 lb 9.6 oz 410 lb 1.6 oz 412 lb 1.6 oz  Weight (kg) 185.793 kg 186.02 kg 186.927 kg      Telemetry    NSR, heart rate 70-80s with no arrhythmias noted - Personally Reviewed  ECG    No new - Personally Reviewed  Physical Exam   GEN: No  acute distress.   Neck:  JVD difficult to assess due to body habitus Cardiac: RRR, no murmurs, rubs, or gallops.  Respiratory: Diminished B/L GI: Soft, nontender,non distended  MS: Negative for pitting edema but difficult to assess; left upper thigh moderately firm; No deformity. Neuro:  Nonfocal  Psych: Normal affect   Labs    High Sensitivity Troponin:   Recent Labs  Lab 04/11/19 1338 04/11/19 1538  TROPONINIHS 14 16      Cardiac EnzymesNo results for input(s): TROPONINI in the last 168 hours. No results for input(s): TROPIPOC in the last 168 hours.   Chemistry Recent Labs  Lab 04/12/19 1138  04/14/19 0722 04/15/19 0641 04/16/19 0655  NA  --    < > 135 135 135  K  --    < > 4.2 3.9 4.1  CL  --    < > 97* 92* 93*  CO2  --    < > 28 30 29   GLUCOSE  --    < > 114* 140* 114*  BUN  --    < > 17 27* 40*  CREATININE  --    < >  1.24 1.89* 2.38*  CALCIUM  --    < > 8.9 9.3 9.0  PROT 7.2  --   --   --   --   ALBUMIN 3.5  --   --   --   --   AST 19  --   --   --   --   ALT 14  --   --   --   --   ALKPHOS 51  --   --   --   --   BILITOT 0.2*  --   --   --   --   GFRNONAA  --    < > >60 40* 30*  GFRAA  --    < > >60 46* 35*  ANIONGAP  --    < > 10 13 13    < > = values in this interval not displayed.     Hematology Recent Labs  Lab 04/11/19 1338  WBC 8.2  RBC 4.58  HGB 11.0*  HCT 37.2*  MCV 81.2  MCH 24.0*  MCHC 29.6*  RDW 16.3*  PLT 400    BNP Recent Labs  Lab 04/11/19 1610  BNP 25.5     DDimer  Recent Labs  Lab 04/12/19 1138  DDIMER 0.37     Radiology    No results found.  Cardiac Studies   Echo May 2020: 1. The left ventricle has normal systolic function, with an ejection fraction of 60-65%. The cavity size was normal. There is mildly increased left ventricular wall thickness. Left ventricular diastolic Doppler parameters are consistent with impaired  relaxation. 2. Left atrial size was not well visualized. 3. The aortic valve was not  well visualized Aortic valve regurgitation was not assessed by color flow Doppler. 4. Extremely limited; definity used; normal LV systolic function; mild diastolic dysfunction; right heart not well visualized.  Patient Profile     53 y.o. male with history of chronic diastolic CHF, HTN, PAF, DM and recent Covid infection who is admitted with acute on chronic diastolic CHF.   Assessment & Plan    1. Acute on Chronic Diastolic CF -Patient feeling worse today. He says scrotum is swelling back up. SOB on exertion using 2L O2 as needed -Due to worsening renal function Lasix IV was changed to Lasix 40mg  TID yesterday -Volume status -11.9 L since admission, -1.3L in last 24 hours -Creatinine worse today, 1.89>>2.38 -Holding Lasix until MD eval -Unable to fully assess swelling due to obesity, but it seems there is no pitting edema.  -Monitor Creatinine -Strict I&Os  2. AKI -Creatinine worsening 1.89>>2.38 -Looks like he received his morning dose lasix. Holding until MD eval -Patient feels scrotal swelling worsening -Daily BMET -Consider Renal consult  3. Urinary retention/Scrotal swelling -Worse since today -Patient has urgency but is unable to void -Will order a Bladder scan -Defer to IM  4. Atrial fibrillation, paroxysmal -Xarelto, home med -Currently in NSR, rate controlled in the 80s  5. Hyperlipidemia -Continue statin  6. HTN: -B/P stable  7.. H/o of PE April 2020 -Xarelto  For questions or updates, please contact [place] Please consult [email or web address] for contact info under        Signed, Cadence Ninfa Meeker, PA-C  04/16/2019, 9:20 AM    I have personally seen and examined this patient. I agree with the assessment and plan as outlined above.  He remains volume overloaded. He is admitted with acute on chronic diastolic CHF. He has been diuresed over the last few  days with a lasix drip which was stopped yesterday due to worsening renal function. He was transitioned  to TID dosing of IV Lasix yesterday and received his last dose this am. Negative 11.9 liters since admission but still reporting that he feels overloaded and now with worsened scrotal edema today. Creatinine up to 2.38 today.  He is difficult to manage given morbid obesity. I suspect he is still volume overloaded. He may need a right heart catheterization to better assess his volume status. I will ask our Advanced Heart Failure team to see him today to advise [place] on the best strategy for his diuresis and long term fluid management.   Verne Carrow 04/16/2019 10:30 AM

## 2019-04-16 NOTE — Progress Notes (Signed)
Bladder scan completed post void. 28cc noted.

## 2019-04-17 ENCOUNTER — Encounter (HOSPITAL_COMMUNITY): Payer: Self-pay | Admitting: Cardiology

## 2019-04-17 ENCOUNTER — Encounter (HOSPITAL_COMMUNITY)
Admission: EM | Disposition: A | Discharge status: Home Health | Payer: Self-pay | Source: Home / Self Care | Attending: Internal Medicine

## 2019-04-17 DIAGNOSIS — I509 Heart failure, unspecified: Secondary | ICD-10-CM

## 2019-04-17 HISTORY — PX: RIGHT HEART CATH: CATH118263

## 2019-04-17 LAB — BASIC METABOLIC PANEL
Anion gap: 11 (ref 5–15)
BUN: 41 mg/dL — ABNORMAL HIGH (ref 6–20)
CO2: 30 mmol/L (ref 22–32)
Calcium: 9.3 mg/dL (ref 8.9–10.3)
Chloride: 94 mmol/L — ABNORMAL LOW (ref 98–111)
Creatinine, Ser: 1.83 mg/dL — ABNORMAL HIGH (ref 0.61–1.24)
GFR calc Af Amer: 48 mL/min — ABNORMAL LOW (ref >60)
GFR calc non Af Amer: 41 mL/min — ABNORMAL LOW (ref >60)
Glucose, Bld: 115 mg/dL — ABNORMAL HIGH (ref 70–99)
Potassium: 4.1 mmol/L (ref 3.5–5.1)
Sodium: 135 mmol/L (ref 135–145)

## 2019-04-17 LAB — POCT I-STAT EG7
Acid-Base Excess: 7 mmol/L — ABNORMAL HIGH (ref 0.0–2.0)
Acid-Base Excess: 7 mmol/L — ABNORMAL HIGH (ref 0.0–2.0)
Bicarbonate: 33.8 mmol/L — ABNORMAL HIGH (ref 20.0–28.0)
Bicarbonate: 34.1 mmol/L — ABNORMAL HIGH (ref 20.0–28.0)
Calcium, Ion: 1.2 mmol/L (ref 1.15–1.40)
Calcium, Ion: 1.22 mmol/L (ref 1.15–1.40)
HCT: 39 % (ref 39.0–52.0)
HCT: 40 % (ref 39.0–52.0)
Hemoglobin: 13.3 g/dL (ref 13.0–17.0)
Hemoglobin: 13.6 g/dL (ref 13.0–17.0)
O2 Saturation: 63 %
O2 Saturation: 64 %
Potassium: 4.1 mmol/L (ref 3.5–5.1)
Potassium: 4.2 mmol/L (ref 3.5–5.1)
Sodium: 137 mmol/L (ref 135–145)
Sodium: 138 mmol/L (ref 135–145)
TCO2: 36 mmol/L — ABNORMAL HIGH (ref 22–32)
TCO2: 36 mmol/L — ABNORMAL HIGH (ref 22–32)
pCO2, Ven: 57.7 mmHg (ref 44.0–60.0)
pCO2, Ven: 58.1 mmHg (ref 44.0–60.0)
pH, Ven: 7.374 (ref 7.250–7.430)
pH, Ven: 7.379 (ref 7.250–7.430)
pO2, Ven: 35 mmHg (ref 32.0–45.0)
pO2, Ven: 35 mmHg (ref 32.0–45.0)

## 2019-04-17 LAB — GLUCOSE, CAPILLARY
Glucose-Capillary: 112 mg/dL — ABNORMAL HIGH (ref 70–99)
Glucose-Capillary: 143 mg/dL — ABNORMAL HIGH (ref 70–99)
Glucose-Capillary: 98 mg/dL (ref 70–99)
Glucose-Capillary: 133 mg/dL — ABNORMAL HIGH (ref 70–99)

## 2019-04-17 SURGERY — RIGHT HEART CATH
Anesthesia: LOCAL

## 2019-04-17 MED ORDER — IPRATROPIUM BROMIDE HFA 17 MCG/ACT IN AERS: 2.0000 | INHALATION_SPRAY | Freq: Four times a day (QID) | RESPIRATORY_TRACT | Status: DC

## 2019-04-17 MED ORDER — FUROSEMIDE 10 MG/ML IJ SOLN
80.0000 mg | Freq: Two times a day (BID) | INTRAMUSCULAR | Status: DC
  Administered 2019-04-17 (×2): 80 mg via INTRAVENOUS
  Filled 2019-04-17 (×3): qty 8

## 2019-04-17 MED ORDER — LIDOCAINE HCL (PF) 1 % IJ SOLN
INTRAMUSCULAR | Status: DC | PRN
  Administered 2019-04-17: 2 mL

## 2019-04-17 MED ORDER — RIVAROXABAN 20 MG PO TABS
20.0000 mg | ORAL_TABLET | Freq: Every day | ORAL | Status: DC
  Administered 2019-04-17 – 2019-04-19 (×3): 20 mg via ORAL
  Filled 2019-04-17 (×3): qty 1

## 2019-04-17 MED ORDER — LIDOCAINE HCL (PF) 1 % IJ SOLN
INTRAMUSCULAR | Status: AC
  Filled 2019-04-17: qty 30

## 2019-04-17 MED ORDER — IPRATROPIUM BROMIDE 0.02 % IN SOLN
0.5000 mg | Freq: Four times a day (QID) | RESPIRATORY_TRACT | Status: DC
  Administered 2019-04-17 (×2): 0.5 mg via RESPIRATORY_TRACT
  Filled 2019-04-17 (×2): qty 2.5

## 2019-04-17 MED ORDER — HEPARIN (PORCINE) IN NACL 1000-0.9 UT/500ML-% IV SOLN
INTRAVENOUS | Status: AC
  Filled 2019-04-17: qty 500

## 2019-04-17 MED ORDER — HEPARIN (PORCINE) IN NACL 1000-0.9 UT/500ML-% IV SOLN
INTRAVENOUS | Status: DC | PRN
  Administered 2019-04-17: 500 mL

## 2019-04-17 SURGICAL SUPPLY — 9 items
CATH BALLN WEDGE 5F 110CM (CATHETERS) ×1 IMPLANT
COVER DOME SNAP 22 D (MISCELLANEOUS) ×1 IMPLANT
PACK CARDIAC CATHETERIZATION (CUSTOM PROCEDURE TRAY) ×2 IMPLANT
PROTECTION STATION PRESSURIZED (MISCELLANEOUS) ×2
SHEATH RAIN 4/5FR (SHEATH) ×1 IMPLANT
STATION PROTECTION PRESSURIZED (MISCELLANEOUS) IMPLANT
TRANSDUCER W/STOPCOCK (MISCELLANEOUS) ×2 IMPLANT
TUBING ART PRESS 72  MALE/FEM (TUBING) ×1
TUBING ART PRESS 72 MALE/FEM (TUBING) IMPLANT

## 2019-04-17 NOTE — Progress Notes (Signed)
Patient ID: Benjamin [name], male   DOB: 31-Jan-1966, 53 y.o.   MRN: [id number]     Advanced Heart Failure Rounding Note  PCP-Cardiologist: Dietrich Pates, MD   Subjective:    He only got 1 dose of Lasix 40 mg IV yesterday, creatinine down to 1.8.  Breathing ok, still feels "swollen."  RHC done today as below.   RHC Procedural Findings: Hemodynamics (mmHg) RA mean 12 RV 37/18 PA 40/25, mean 31 PCWP mean 23 Oxygen saturations: PA 64% AO 100% Cardiac Output (Fick) 7.07  Cardiac Index (Fick) 2.47 PVR 1.1 WU   Objective:   Weight Range: (!) 186.6 kg Body mass index is 57.38 kg/m.   Vital Signs:   Temp:  [97.6 F (36.4 C)-98.6 F (37 C)] 97.9 F (36.6 C) (07/29 0559) Pulse Rate:  [82-89] 83 (07/29 0743) Resp:  [16-19] 18 (07/29 0743) BP: (123-159)/(79-90) 159/90 (07/29 0559) SpO2:  [89 %-95 %] 95 % (07/29 0832) Weight:  [186.6 kg] 186.6 kg (07/29 0559) Last BM Date: 04/16/19  Weight change: Filed Weights   04/15/19 0500 04/16/19 0549 04/17/19 0559  Weight: (!) 186 kg (!) 185.8 kg (!) 186.6 kg    Intake/Output:   Intake/Output Summary (Last 24 hours) at 04/17/2019 0907 Last data filed at 04/17/2019 0639 Gross per 24 hour  Intake 240 ml  Output 1753 ml  Net -1513 ml      Physical Exam    General:  Obese, NAD.  HEENT: Normal Neck: Thick. JVP 10 cm. Carotids 2+ bilat; no bruits. No lymphadenopathy or thyromegaly appreciated. Cor: PMI nonpalpable. Regular rate & rhythm. No rubs, gallops or murmurs. Lungs: Decreased at bases.  Abdomen: Soft, nontender, nondistended. No hepatosplenomegaly. No bruits or masses. Good bowel sounds. Extremities: No cyanosis, clubbing, rash. 1+ chronic edema to knees.  Still with scrotal edema.  Neuro: Alert & orientedx3, cranial nerves grossly intact. moves all 4 extremities w/o difficulty. Affect pleasant   Telemetry   NSR 70s (personally reviewed)  Labs    CBC No results for input(s): WBC, NEUTROABS, HGB, HCT, MCV, PLT in  the last 72 hours. Basic Metabolic Panel Recent Labs    95/63/87 0655 04/17/19 0505  NA 135 135  K 4.1 4.1  CL 93* 94*  CO2 29 30  GLUCOSE 114* 115*  BUN 40* 41*  CREATININE 2.38* 1.83*  CALCIUM 9.0 9.3   Liver Function Tests No results for input(s): AST, ALT, ALKPHOS, BILITOT, PROT, ALBUMIN in the last 72 hours. No results for input(s): LIPASE, AMYLASE in the last 72 hours. Cardiac Enzymes No results for input(s): CKTOTAL, CKMB, CKMBINDEX, TROPONINI in the last 72 hours.  BNP: BNP (last 3 results) Recent Labs    03/11/19 1654 04/01/19 1902 04/11/19 1610  BNP 19.9 34.9 25.5    ProBNP (last 3 results) No results for input(s): PROBNP in the last 8760 hours.   D-Dimer No results for input(s): DDIMER in the last 72 hours. Hemoglobin A1C No results for input(s): HGBA1C in the last 72 hours. Fasting Lipid Panel No results for input(s): CHOL, HDL, LDLCALC, TRIG, CHOLHDL, LDLDIRECT in the last 72 hours. Thyroid Function Tests No results for input(s): TSH, T4TOTAL, T3FREE, THYROIDAB in the last 72 hours.  Invalid input(s): FREET3  Other results:   Imaging     No results found.   Medications:     Scheduled Medications: . [MAR Hold] atorvastatin  20 mg Oral QHS  . [MAR Hold] docusate sodium  100 mg Oral BID  . [MAR Hold] ferrous  sulfate  325 mg Oral Daily  . furosemide  80 mg Intravenous BID  . [MAR Hold] insulin aspart  0-15 Units Subcutaneous TID WC  . [MAR Hold] insulin aspart  0-5 Units Subcutaneous QHS  . [MAR Hold] ipratropium  0.5 mg Nebulization Q6H  . [MAR Hold] labetalol  200 mg Oral BID  . [MAR Hold] levalbuterol  2 puff Inhalation Q6H  . [MAR Hold] pantoprazole  40 mg Oral Daily  . [MAR Hold] polyvinyl alcohol  2 drop Both Eyes BID  . [MAR Hold] sodium chloride flush  3 mL Intravenous Q12H  . [MAR Hold] sodium chloride flush  3 mL Intravenous Q12H     Infusions: . [MAR Hold] sodium chloride    . sodium chloride    . sodium chloride 10  mL/hr at 04/17/19 0639     PRN Medications:  [MAR Hold] sodium chloride, sodium chloride, [MAR Hold] acetaminophen, [MAR Hold] hydrALAZINE, [MAR Hold] methocarbamol, [MAR Hold] ondansetron (ZOFRAN) IV, [MAR Hold] oxyCODONE, [MAR Hold] polyethylene glycol, [MAR Hold] sodium chloride flush, sodium chloride flush   Assessment/Plan   1. Acute on chronic diastolic CHF: Echo in 1/94 showed EF 60-65% but RV was poorly visualized.  He was initially diuresed with Lasix gtt 10 mg/hr and weight dropped 10 lbs.  However, creatinine increased from 1.24 => 2.38 and he was transitioned to Lasix 40 mg IV.  I suspect significant RV dysfunction in setting of OSA, unfortunately unable to see the RV well on last echo.  RHC today showed elevated left and right heart filling pressures but not marked elevation.  Creatinine down to 1.8.  - I will give him Lasix 80 mg IV bid today and follow renal function.  Possible transition to torsemide tomorrow to allow slow diuresis over time.  I think that a lot of his peripheral edema may be chronic venous insufficiency that will likely not correct completely in the acute setting.  - Continue Unna boots.  2. AKI: Creatinine better today at 1.8.  Follow closely with diuresis.  3. Atrial fibrillation: Paroxysmal.  He has been in NSR.  - Restart Xarelto this afternoon.  4. OSA: Continue CPAP.  5. Obesity: Weight loss is imperative.  6. H/o PE: 5/20.  Restarting Xarelto.  7. H/o COVID-19 infection: Tested negative this admission.   Length of Stay: 6  Loralie Champagne, MD  04/17/2019, 9:07 AM  Advanced Heart Failure Team Pager [phone] (M-F; 7a - 4p)  Please contact [facility] for night-coverage after hours (4p -7a ) and weekends on [email or web address]

## 2019-04-17 NOTE — Progress Notes (Signed)
ANTICOAGULATION CONSULT NOTE - Initial Consult  Pharmacy Consult for rivaroxaban  Indication: atrial fibrillation / Hx PE 2019  No Known Allergies  Patient Measurements: Height: 5\' 11"  (180.3 cm) Weight: (!) 411 lb 6.4 oz (186.6 kg)(scale b) IBW/kg (Calculated) : 75.3   Vital Signs: Temp: 97.9 F (36.6 C) (07/29 0559) Temp Source: Oral (07/29 0559) BP: 122/98 (07/29 0941) Pulse Rate: 87 (07/29 0941)  Labs: Recent Labs    04/15/19 0641 04/16/19 0655 04/17/19 0505  CREATININE 1.89* 2.38* 1.83*    Estimated Creatinine Clearance: 79.1 mL/min (A) (by C-G formula based on SCr of 1.83 mg/dL (H)).   Medical History: Past Medical History:  Diagnosis Date  . A-fib ([place])   . Diabetes mellitus without complication ([place])   . Elevated troponin 02/06/2019  . Hypertension   . Obesity   . Pulmonary embolism ([place])   . Sleep apnea      Assessment: 53yom with HFpEF admitted with volume overload net- 13L  Hx PE 2019, Hx Afib currently in SR  Oral anticoagulation held for RHC today - restart this evening  Cr 1.8 Crcl still > 84ml/min   Goal of Therapy:   Monitor platelets by anticoagulation protocol: Yes   Plan:  Rivaroxaban 20mg  daily - will restart this PM post cath this am Monitor s/s bleeding   Bonnita Nasuti Pharm.D. CPP, BCPS Clinical Pharmacist [phone] 04/17/2019 10:46 AM

## 2019-04-17 NOTE — Progress Notes (Signed)
Pt has been refusing to eat his lunch tray twice. RN offered to order something else for lunch, pt keeps refusing and cursing stating "I don't eat that", and threatening to leave AMA because "we don't give him food that he wants". MD notified

## 2019-04-17 NOTE — Interval H&P Note (Signed)
History and Physical Interval Note:  04/17/2019 8:40 AM  Benjamin [name]  has presented today for surgery, with the diagnosis of chf.  The various methods of treatment have been discussed with the patient and family. After consideration of risks, benefits and other options for treatment, the patient has consented to  Procedure(s): RIGHT HEART CATH (N/A) as a surgical intervention.  The patient's history has been reviewed, patient examined, no change in status, stable for surgery.  I have reviewed the patient's chart and labs.  Questions were answered to the patient's satisfaction.     Aldina Porta [employer]

## 2019-04-17 NOTE — Progress Notes (Signed)
PROGRESS NOTE  Benjamin [name] JJO:[id number] DOB: 11-21-1965 DOA: 04/11/2019 PCP: No primary care provider on file.  HPI/Recap of past 24 hours: Benjamin [name]  is a 53 y.o. male, with medical history significant ofdCHF,hypertension, hyperlipidemia, diabetes mellitus, GERD, atrial fibrillationandPE on Xarelto,OSA on CPAP, iron deficiency anemia, morbid obesity, chronic elevation of troponin , as well recent COVID-19 infection. Patient presents secondary to worsening lower extremity edema and scrotal edema, she had recent ED visit for the same, patient report he has been compliant with his medications, including Lasix 80 mg 3 times a day, reports his legs feel very heavy, and uncomfortable, as well as scrotum and significantly swollen as well limiting his movement, upon further questioning, patient report he is compliant with fluid restriction, salt restriction, reports mildly worsening dyspnea, mainly upon laying supine, denies any chest pain, cough, fever, chills, nausea, vomiting, hemoptysis, diarrhea, or blood per rectum. - in ED no significant labs abnormalities, oxygen requirement at baseline, but patient was noted to have significantly swollen scrotum and +2 lower extremity edema, received 80 mg IV Lasix as I was called to admit for volume overload.  Started on lasix drip and ongoing use with improvement of volume status.  04/17/19: Patient was seen and examined at his bedside.  He was in good spirits prior to right heart cath.  Per cardiology right heart cath today showed elevated left and right heart filling pressures but not marked elevation.  Ongoing diuresing.   Assessment/Plan: Active Problems:   PE (pulmonary thromboembolism) ([place])   Diabetes mellitus type 2 in obese (HCC)   Bilateral lower extremity edema   Obesity, Class III, BMI 40-49.9 (morbid obesity) (HCC)   Chronic diastolic CHF (congestive heart failure) (HCC)   PAF (paroxysmal atrial fibrillation) ([place])  COVID-19 virus infection   OSA on CPAP   HLD (hyperlipidemia)   GERD (gastroesophageal reflux disease)   Acute on chronic diastolic CHF (congestive heart failure) (HCC)   Severe Anasarca/volume overload possibly secondary to acute on chronic diastolic CHF  Presented with significant volume overload with 3+ pitting edema in bilateral lower extremities, significant scrotal edema Last CMP 03/12/2019 with normal LFTs. Lasix drip Dc'd on 04/15/2019 IV Lasix 40 mg 3 times daily held by cardiology Lasix resumed 80 mg twice daily and possible transition to torsemide tomorrow Continue strict I's and O's and daily weight Net I&O -13.3 L since admission Continue salt restriction less than 2 g/day Continue fluid restriction less than 1200 cc/day  AKI on CKD 2 Baseline creatinine appears to be 1.2 with GFR greater than 60 Creatinine today is 1.8 with GFR 48 Continue to avoid nephrotoxins Daily BMPs   Paroxysmal A. Fib Currently in sinus rhythm Rate controlled, on labetalol On Xarelto for CVA prevention and history of PE DVT  Scrotal edema/Large left spermatocele? with tenderness likely secondary to fluid overload If no improvement of scrotal edema with Lasix drip we will consult urology Afebrile with no leukocytosis Scrotal US revealed: Normal appearing testes with small BILATERAL hydroceles. Loculated cysts adjacent to LEFT testis question spermatoceles versus epididymal cysts largest 5.6 cm in greatest size. Curbsided with urology Dr.Wrenn who looked at the labs and [place]. We will continue to diurese, if his edema resolves and his cardiac risk is acceptable and still symptomatic he could be considered for surgical repair however he may not be a good candidate.  Acute on chronic diastolic CHF BNP 25. Last BNP 34 from 04/01/2019 Completed Lasix drip and 1 dose xaroxylyn Continue strict I's and O's  and daily weight  Acute on chronic hypoxic respiratory failure likely multifactorial in the  setting of fluid overload, improving  On oxygen supplementation 2L at baseline Continue bronchodilators Maintain O2 saturation greater than 92% Improving  Recent COVID-19 infection First dx positive covid-19 in 02/06/19, admitted to [facility] and discharged after 6 days on O2, recommended cardiology follow up. Afebrile Denies chest pain or dyspnea or cough.  Noted prior positive COVID-19 on 03/11/2019, repeated COVID-19 test negative on 04/11/2019.  Independently reviewed chest x-ray done on admission which showed markings similar to prior chest x-ray done on 04/01/19, improved from cxr done on 03/11/19, markings more prominent at bases.  Inflammatory markers trended down Negative covid x 2 this admission including 1 negative send out test Vital signs reviewed and are stable.  Resolved Uncontrolled hypertension BP is at goal Continue antihypertensives Continue IV hydralazine PRN for SBP greater than 160  Severe Morbid obesity BMI 57 Recommend weight loss outpatient with regular physical activity and healthy diet once stable  Type 2 diabetes with hyperglycemia Last hemoglobin A1c 7.6 on 01/23/2019 Continue insulin coverage  History of PE and DVT Continue xarelto Maintain O2 saturation greater than 92%  OSA Continue CPAP at night   Code Status: Full code   Family Communication: We will call family if okay with the patient.  Disposition Plan: Home possibly in 2 to 3 days when volume status is close to baseline and cardiology signs off.   Consultants:  Cardiology  Curbsided with urology, Dr. Annabell Howells on 04/14/19. Not a formal consult.  Procedures:  None   Antimicrobials:  None  DVT prophylaxis:  Xarelto   Objective: Vitals:   04/17/19 0921 04/17/19 0941 04/17/19 1144 04/17/19 1219  BP: (!) 132/94 (!) 122/98 118/65 112/82  Pulse: 81 87 91 72  Resp: 18 18 (!) 21   Temp:   97.8 F (36.6 C)   TempSrc:   Oral   SpO2: 97%  98%   Weight:      Height:        Intake/Output  Summary (Last 24 hours) at 04/17/2019 1412 Last data filed at 04/17/2019 1148 Gross per 24 hour  Intake 477 ml  Output 2175 ml  Net -1698 ml   Filed Weights   04/15/19 0500 04/16/19 0549 04/17/19 0559  Weight: (!) 186 kg (!) 185.8 kg (!) 186.6 kg    Exam:  . General: 53 y.o. year-old male morbidly obese in no acute distress.  Alert and oriented x3.   . Cardiovascular: Good rate and rhythm no rubs or gallops no JVD or thyromegaly.  Marland Kitchen Respiratory: Clear to auscultation no wheezes or rales.  Poor inspiratory effort. . Abdomen: Morbidly obese nontender nondistended with normal bowel sounds present . Musculoskeletal: 1+ pitting edema in lower extremities bilaterally.   Marland Kitchen Psychiatry: Mood is appropriate for condition and setting.  Data Reviewed: CBC: Recent Labs  Lab 04/11/19 1338  WBC 8.2  NEUTROABS 4.4  HGB 11.0*  HCT 37.2*  MCV 81.2  PLT 400   Basic Metabolic Panel: Recent Labs  Lab 04/13/19 0654 04/14/19 0722 04/15/19 0641 04/16/19 0655 04/17/19 0505  NA 137 135 135 135 135  K 3.7 4.2 3.9 4.1 4.1  CL 97* 97* 92* 93* 94*  CO2 30 28 30 29 30   GLUCOSE 137* 114* 140* 114* 115*  BUN 16 17 27* 40* 41*  CREATININE 1.30* 1.24 1.89* 2.38* 1.83*  CALCIUM 8.8* 8.9 9.3 9.0 9.3  MG 1.9  --   --   --   --  GFR: Estimated Creatinine Clearance: 79.1 mL/min (A) (by C-G formula based on SCr of 1.83 mg/dL (H)). Liver Function Tests: Recent Labs  Lab 04/12/19 1138  AST 19  ALT 14  ALKPHOS 51  BILITOT 0.2*  PROT 7.2  ALBUMIN 3.5   No results for input(s): LIPASE, AMYLASE in the last 168 hours. No results for input(s): AMMONIA in the last 168 hours. Coagulation Profile: No results for input(s): INR, PROTIME in the last 168 hours. Cardiac Enzymes: No results for input(s): CKTOTAL, CKMB, CKMBINDEX, TROPONINI in the last 168 hours. BNP (last 3 results) No results for input(s): PROBNP in the last 8760 hours. HbA1C: No results for input(s): HGBA1C in the last 72 hours.  CBG: Recent Labs  Lab 04/16/19 1116 04/16/19 1558 04/16/19 2151 04/17/19 0606 04/17/19 1146  GLUCAP 108* 97 187* 112* 143*   Lipid Profile: No results for input(s): CHOL, HDL, LDLCALC, TRIG, CHOLHDL, LDLDIRECT in the last 72 hours. Thyroid Function Tests: No results for input(s): TSH, T4TOTAL, FREET4, T3FREE, THYROIDAB in the last 72 hours. Anemia Panel: No results for input(s): VITAMINB12, FOLATE, FERRITIN, TIBC, IRON, RETICCTPCT in the last 72 hours. Urine analysis:    Component Value Date/Time   COLORURINE STRAW (A) 04/13/2019 0044   APPEARANCEUR CLEAR 04/13/2019 0044   LABSPEC 1.009 04/13/2019 0044   PHURINE 5.0 04/13/2019 0044   GLUCOSEU NEGATIVE 04/13/2019 0044   HGBUR NEGATIVE 04/13/2019 0044   BILIRUBINUR NEGATIVE 04/13/2019 0044   KETONESUR NEGATIVE 04/13/2019 0044   PROTEINUR NEGATIVE 04/13/2019 0044   NITRITE NEGATIVE 04/13/2019 0044   LEUKOCYTESUR NEGATIVE 04/13/2019 0044   Sepsis Labs: @LABRCNTIP (procalcitonin:4,lacticidven:4)  ) Recent Results (from the past 240 hour(s))  SARS Coronavirus 2 (CEPHEID - Performed in [facility] lab), Hosp Order     Status: None   Collection Time: 04/11/19  6:54 PM   Specimen: Nasopharyngeal Swab  Result Value Ref Range Status   SARS Coronavirus 2 NEGATIVE NEGATIVE Final    Comment: (NOTE) If result is NEGATIVE SARS-CoV-2 target nucleic acids are NOT DETECTED. The SARS-CoV-2 RNA is generally detectable in upper and lower  respiratory specimens during the acute phase of infection. The lowest  concentration of SARS-CoV-2 viral copies this assay can detect is 250  copies / mL. A negative result does not preclude SARS-CoV-2 infection  and should not be used as the sole basis for treatment or other  patient management decisions.  A negative result may occur with  improper specimen collection / handling, submission of specimen other  than nasopharyngeal swab, presence of viral mutation(s) within the  areas targeted  by this assay, and inadequate number of viral copies  (<250 copies / mL). A negative result must be combined with clinical  observations, patient history, and epidemiological information. If result is POSITIVE SARS-CoV-2 target nucleic acids are DETECTED. The SARS-CoV-2 RNA is generally detectable in upper and lower  respiratory specimens dur ing the acute phase of infection.  Positive  results are indicative of active infection with SARS-CoV-2.  Clinical  correlation with patient history and other diagnostic information is  necessary to determine patient infection status.  Positive results do  not rule out bacterial infection or co-infection with other viruses. If result is PRESUMPTIVE POSTIVE SARS-CoV-2 nucleic acids MAY BE PRESENT.   A presumptive positive result was obtained on the submitted specimen  and confirmed on repeat testing.  While 2019 novel coronavirus  (SARS-CoV-2) nucleic acids may be present in the submitted sample  additional confirmatory testing may be necessary for epidemiological  and / or clinical management purposes  to differentiate between  SARS-CoV-2 and other Sarbecovirus currently known to infect humans.  If clinically indicated additional testing with an alternate test  methodology [phone]([id number]) is advised. The SARS-CoV-2 RNA is generally  detectable in upper and lower respiratory sp ecimens during the acute  phase of infection. The expected result is Negative. Fact Sheet for Patients:  BoilerBrush.com.cyhttps://www.fda.gov/media/136312/download Fact Sheet for Healthcare Providers: https://pope.com/https://www.fda.gov/media/136313/download This test is not yet approved or cleared by the [place] FDA and has been authorized for detection and/or diagnosis of SARS-CoV-2 by FDA under an Emergency Use Authorization (EUA).  This EUA will remain in effect (meaning this test can be used) for the duration of the COVID-19 declaration under Section 564(b)(1) of the Act, 21 U.S.C. section  360bbb-3(b)(1), unless the authorization is terminated or revoked sooner. Performed at [facility], [address] [place], [place] [address]   Novel Coronavirus,NAA,(SEND-OUT TO REF LAB - TAT 24-48 hrs); Hosp Order     Status: None   Collection Time: 04/12/19  4:03 PM   Specimen: Nasopharyngeal Swab; Respiratory  Result Value Ref Range Status   SARS-CoV-2, NAA NOT DETECTED NOT DETECTED Final    Comment: (NOTE) This test was developed and its performance characteristics determined by [facility]. This test has not been FDA cleared or approved. This test has been authorized by FDA under an Emergency Use Authorization (EUA). This test is only authorized for the duration of time the declaration that circumstances exist justifying the authorization of the emergency use of in vitro diagnostic tests for detection of SARS-CoV-2 virus and/or diagnosis of COVID-19 infection under section 564(b)(1) of the Act, 21 U.S.C. 478GNF-6(O)(1360bbb-3(b)(1), unless the authorization is terminated or revoked sooner. When diagnostic testing is negative, the possibility of a false negative result should be considered in the context of a patient's recent exposures and the presence of clinical signs and symptoms consistent with COVID-19. An individual without symptoms of COVID-19 and who is not shedding SARS-CoV-2 virus would expect to have a negative (not detected) result in this assay. Performed  At: [facility] [employer] [address] [place], [place] [id number] Jolene SchimkeNagendra Sanjai MD NG:[id number]:[phone]    Coronavirus Source NASOPHARYNGEAL  Final    Comment: Performed at [facility], [address] [place], [place] [address]      Studies: No results found.  Scheduled Meds: . atorvastatin  20 mg Oral QHS  . docusate sodium  100 mg Oral BID  . ferrous sulfate  325 mg Oral Daily  . furosemide  80 mg Intravenous BID  . insulin aspart  0-15 Units Subcutaneous TID WC  . insulin aspart  0-5 Units  Subcutaneous QHS  . ipratropium  0.5 mg Nebulization Q6H  . labetalol  200 mg Oral BID  . levalbuterol  2 puff Inhalation Q6H  . pantoprazole  40 mg Oral Daily  . polyvinyl alcohol  2 drop Both Eyes BID  . rivaroxaban  20 mg Oral Q supper  . sodium chloride flush  3 mL Intravenous Q12H  . sodium chloride flush  3 mL Intravenous Q12H    Continuous Infusions: . sodium chloride       LOS: 6 days     Darlin Droparole N Kanan Sobek, MD [facility] Pager [phone]  If 7PM-7AM, please contact night-coverage [email or web address] Password [email or web address] 04/17/2019, 2:12 PM

## 2019-04-18 LAB — CBC WITH DIFFERENTIAL/PLATELET
Abs Immature Granulocytes: 0.02 10*3/uL (ref 0.00–0.07)
Basophils Absolute: 0 10*3/uL (ref 0.0–0.1)
Basophils Relative: 0 %
Eosinophils Absolute: 0.5 10*3/uL (ref 0.0–0.5)
Eosinophils Relative: 6 %
HCT: 35.9 % — ABNORMAL LOW (ref 39.0–52.0)
Hemoglobin: 10.8 g/dL — ABNORMAL LOW (ref 13.0–17.0)
Immature Granulocytes: 0 %
Lymphocytes Relative: 34 %
Lymphs Abs: 2.5 10*3/uL (ref 0.7–4.0)
MCH: 24.2 pg — ABNORMAL LOW (ref 26.0–34.0)
MCHC: 30.1 g/dL (ref 30.0–36.0)
MCV: 80.5 fL (ref 80.0–100.0)
Monocytes Absolute: 0.8 10*3/uL (ref 0.1–1.0)
Monocytes Relative: 11 %
Neutro Abs: 3.7 10*3/uL (ref 1.7–7.7)
Neutrophils Relative %: 49 %
Platelets: 410 10*3/uL — ABNORMAL HIGH (ref 150–400)
RBC: 4.46 MIL/uL (ref 4.22–5.81)
RDW: 16.1 % — ABNORMAL HIGH (ref 11.5–15.5)
WBC: 7.5 10*3/uL (ref 4.0–10.5)
nRBC: 0 % (ref 0.0–0.2)

## 2019-04-18 LAB — BASIC METABOLIC PANEL
Anion gap: 11 (ref 5–15)
BUN: 41 mg/dL — ABNORMAL HIGH (ref 6–20)
CO2: 29 mmol/L (ref 22–32)
Calcium: 8.9 mg/dL (ref 8.9–10.3)
Chloride: 95 mmol/L — ABNORMAL LOW (ref 98–111)
Creatinine, Ser: 1.81 mg/dL — ABNORMAL HIGH (ref 0.61–1.24)
GFR calc Af Amer: 48 mL/min — ABNORMAL LOW (ref >60)
GFR calc non Af Amer: 42 mL/min — ABNORMAL LOW (ref >60)
Glucose, Bld: 174 mg/dL — ABNORMAL HIGH (ref 70–99)
Potassium: 4.4 mmol/L (ref 3.5–5.1)
Sodium: 135 mmol/L (ref 135–145)

## 2019-04-18 LAB — GLUCOSE, CAPILLARY
Glucose-Capillary: 177 mg/dL — ABNORMAL HIGH (ref 70–99)
Glucose-Capillary: 118 mg/dL — ABNORMAL HIGH (ref 70–99)
Glucose-Capillary: 112 mg/dL — ABNORMAL HIGH (ref 70–99)
Glucose-Capillary: 128 mg/dL — ABNORMAL HIGH (ref 70–99)

## 2019-04-18 MED ORDER — FUROSEMIDE 10 MG/ML IJ SOLN
80.0000 mg | Freq: Two times a day (BID) | INTRAMUSCULAR | Status: AC
  Administered 2019-04-18: 80 mg via INTRAVENOUS

## 2019-04-18 MED ORDER — TORSEMIDE 20 MG PO TABS
40.0000 mg | ORAL_TABLET | Freq: Two times a day (BID) | ORAL | Status: DC
  Administered 2019-04-18 – 2019-04-19 (×3): 40 mg via ORAL
  Filled 2019-04-18 (×3): qty 2

## 2019-04-18 MED ORDER — AMLODIPINE BESYLATE 10 MG PO TABS
10.0000 mg | ORAL_TABLET | Freq: Every day | ORAL | Status: DC
  Administered 2019-04-18 – 2019-04-19 (×2): 10 mg via ORAL
  Filled 2019-04-18 (×2): qty 1

## 2019-04-18 MED ORDER — IPRATROPIUM BROMIDE 0.02 % IN SOLN
0.5000 mg | Freq: Three times a day (TID) | RESPIRATORY_TRACT | Status: DC
  Administered 2019-04-18 – 2019-04-19 (×5): 0.5 mg via RESPIRATORY_TRACT
  Filled 2019-04-18 (×5): qty 2.5

## 2019-04-18 MED ORDER — LEVALBUTEROL TARTRATE 45 MCG/ACT IN AERO
2.0000 | INHALATION_SPRAY | Freq: Three times a day (TID) | RESPIRATORY_TRACT | Status: DC
  Administered 2019-04-18 – 2019-04-19 (×5): 2 via RESPIRATORY_TRACT

## 2019-04-18 NOTE — Progress Notes (Signed)
CARDIAC REHAB PHASE I   Spoke with pt at length about HF and HF management. Pt given HF booklet, low sodium diets, and exercise guidelines. Pt instructed to weigh daily. Pt does not currently own a scale. Explained to pt fluid restrictions and why his nurses are not allowed to give him drinks at will. Also explained his diet and why the cafeteria is limited on what they can give him. Pt insistent he can not eat the salads or broccoli because he is not allowed to have green vegetables. Explained to pt he is no longer on coumadin, and there were no food restrictions with xerleto. Reviewed importance of exercise with pt as related to heart health and overall health. Encourage pt to start slow and build, but that he needed to have purposeful walks everyday. Pt states understanding of education. Stressed importance of pt doing his part so he can remain out of the hospital setting. Will refer to [facility] [place] so pt can hopefully participate in a structured exercise program. Pt hesitant about discharging today, does not feel like he is ready.   [phone] Rufina Falco, RN BSN 04/18/2019 9:53 AM

## 2019-04-18 NOTE — Progress Notes (Signed)
PROGRESS NOTE  Benjamin [name] WUJ:[id number] DOB: 1966/03/29 DOA: 04/11/2019 PCP: No primary care provider on file.  HPI/Recap of past 24 hours: Benjamin Severinimothy [name]  is a 53 y.o. male, with medical history significant ofdCHF,hypertension, hyperlipidemia, diabetes mellitus, GERD, atrial fibrillationandPE on Xarelto,OSA on CPAP, iron deficiency anemia, morbid obesity, chronic elevation of troponin , as well recent COVID-19 infection. Patient presents secondary to worsening lower extremity edema and scrotal edema, she had recent ED visit for the same, patient report he has been compliant with his medications, including Lasix 80 mg 3 times a day, reports his legs feel very heavy, and uncomfortable, as well as scrotum and significantly swollen as well limiting his movement, upon further questioning, patient report he is compliant with fluid restriction, salt restriction, reports mildly worsening dyspnea, mainly upon laying supine, denies any chest pain, cough, fever, chills, nausea, vomiting, hemoptysis, diarrhea, or blood per rectum. - in ED no significant labs abnormalities, oxygen requirement at baseline, but patient was noted to have significantly swollen scrotum and +2 lower extremity edema, received 80 mg IV Lasix as I was called to admit for volume overload.  Started on lasix drip and ongoing use with improvement of volume status.  04/18/19: He does not think fluid status is back to his baseline. Net I&O - 15.2L since admission. Switched to torsemide 40 mg BID.   Assessment/Plan: Active Problems:   PE (pulmonary thromboembolism) (HCC)   Diabetes mellitus type 2 in obese (HCC)   Bilateral lower extremity edema   Obesity, Class III, BMI 40-49.9 (morbid obesity) (HCC)   Chronic diastolic CHF (congestive heart failure) (HCC)   PAF (paroxysmal atrial fibrillation) (HCC)   COVID-19 virus infection   OSA on CPAP   HLD (hyperlipidemia)   GERD (gastroesophageal reflux disease)   Acute on  chronic diastolic CHF (congestive heart failure) (HCC)   Severe Anasarca/volume overload possibly secondary to acute on chronic diastolic CHF  Presented with significant volume overload with 3+ pitting edema in bilateral lower extremities, significant scrotal edema Last CMP 03/12/2019 with normal LFTs. Diuretic switched to torsemide 40 mg BID by dcardiology Continue to monitor U/O Continue strict I's and O's and daily weight Net I&O -15.2L since admission Continue salt restriction less than 2 g/day Continue fluid restriction less than 1200 cc/day  AKI on CKD 2 Baseline creatinine appears to be 1.2 with GFR greater than 60 Cr today 1.8 w GFR 48 Continue to avoid nephrotoxins Daily BMPs   Paroxysmal A. Fib Currently in sinus rhythm Rate controlled, on labetalol On Xarelto for CVA prevention and history of PE DVT  Scrotal edema/Large left spermatocele? with tenderness likely secondary to fluid overload If no improvement of scrotal edema with Lasix drip we will consult urology Afebrile with no leukocytosis Scrotal US revealed: Normal appearing testes with small BILATERAL hydroceles. Loculated cysts adjacent to LEFT testis question spermatoceles versus epididymal cysts largest 5.6 cm in greatest size. Curbsided with urology Dr.Wrenn who looked at the labs and US. We will continue to diurese, if his edema resolves and his cardiac risk is acceptable and still symptomatic he could be considered for surgical repair however he may not be a good candidate.  Acute on chronic diastolic CHF BNP 25. Last BNP 34 from 04/01/2019 Completed Lasix drip and 1 dose xaroxylyn Continue strict I's and O's and daily weight on torsemide 40 mg BID starting 7/30  Acute on chronic hypoxic respiratory failure likely multifactorial in the setting of fluid overload, improving  On oxygen supplementation 2L at  baseline Continue bronchodilators Maintain O2 saturation greater than 92% Improving  Home o2 eval for  dc planning  Recent COVID-19 infection First dx positive covid-19 in 02/06/19, admitted to [facility] and discharged after 6 days on O2, recommended cardiology follow up. Afebrile Denies chest pain or dyspnea or cough.  Noted prior positive COVID-19 on 03/11/2019, repeated COVID-19 test negative on 04/11/2019.  Independently reviewed chest x-ray done on admission which showed markings similar to prior chest x-ray done on 04/01/19, improved from cxr done on 03/11/19, markings more prominent at bases.  Inflammatory markers trended down Negative covid x 2 this admission including 1 negative send out test Vital signs reviewed and are stable.  Resolved Uncontrolled hypertension BP is at goal Continue antihypertensives Continue IV hydralazine PRN for SBP greater than 160  Severe Morbid obesity BMI 57 Recommend weight loss outpatient with regular physical activity and healthy diet once stable  Type 2 diabetes with hyperglycemia Last hemoglobin A1c 7.6 on 01/23/2019 Continue insulin coverage  History of PE and DVT Continue xarelto Maintain O2 saturation greater than 92%  OSA Continue CPAP at night   Code Status: Full code   Family Communication: We will call family if okay with the patient.  Disposition Plan: Home possibly tomorrow.  Consultants:  Cardiology  Curbsided with urology, Dr. Annabell Howells on 04/14/19. Not a formal consult.  Procedures:  None   Antimicrobials:  None  DVT prophylaxis:  Xarelto   Objective: Vitals:   04/18/19 0539 04/18/19 0800 04/18/19 0836 04/18/19 0959  BP: 136/77   (!) 133/105  Pulse: 84   88  Resp: 19   18  Temp: 97.9 F (36.6 C)     TempSrc: Oral     SpO2: 96%  96%   Weight:  (!) 185.9 kg    Height:        Intake/Output Summary (Last 24 hours) at 04/18/2019 1215 Last data filed at 04/18/2019 0900 Gross per 24 hour  Intake 1320 ml  Output 3150 ml  Net -1830 ml   Filed Weights   04/17/19 0559 04/18/19 0500 04/18/19 0800  Weight: (!) 186.6 kg  (!) 185.8 kg (!) 185.9 kg    Exam:  . General: 53 y.o. year-old male Morbid obese, NAD A&O x3 . Cardiovascular: RRR no rubs or gallops NO JVD or thyromegaly . Respiratory: CTA no wheezes or rales. Good inspiratory  Effort . Abdomen: Morbidly obese. NT ND bowel sounds present . Musculoskeletal: 1+ piting edema.  Marland Kitchen Psychiatry: Mood is appropriate  Data Reviewed: CBC: Recent Labs  Lab 04/11/19 1338 04/17/19 0855 04/18/19 0557  WBC 8.2  --  7.5  NEUTROABS 4.4  --  3.7  HGB 11.0* 13.6  13.3 10.8*  HCT 37.2* 40.0  39.0 35.9*  MCV 81.2  --  80.5  PLT 400  --  410*   Basic Metabolic Panel: Recent Labs  Lab 04/13/19 0654 04/14/19 0722 04/15/19 0641 04/16/19 0655 04/17/19 0505 04/17/19 0855 04/18/19 0557  NA 137 135 135 135 135 137  138 135  K 3.7 4.2 3.9 4.1 4.1 4.2  4.1 4.4  CL 97* 97* 92* 93* 94*  --  95*  CO2 30 28 30 29 30   --  29  GLUCOSE 137* 114* 140* 114* 115*  --  174*  BUN 16 17 27* 40* 41*  --  41*  CREATININE 1.30* 1.24 1.89* 2.38* 1.83*  --  1.81*  CALCIUM 8.8* 8.9 9.3 9.0 9.3  --  8.9  MG 1.9  --   --   --   --   --   --  GFR: Estimated Creatinine Clearance: 79.8 mL/min (A) (by C-G formula based on SCr of 1.81 mg/dL (H)). Liver Function Tests: Recent Labs  Lab 04/12/19 1138  AST 19  ALT 14  ALKPHOS 51  BILITOT 0.2*  PROT 7.2  ALBUMIN 3.5   No results for input(s): LIPASE, AMYLASE in the last 168 hours. No results for input(s): AMMONIA in the last 168 hours. Coagulation Profile: No results for input(s): INR, PROTIME in the last 168 hours. Cardiac Enzymes: No results for input(s): CKTOTAL, CKMB, CKMBINDEX, TROPONINI in the last 168 hours. BNP (last 3 results) No results for input(s): PROBNP in the last 8760 hours. HbA1C: No results for input(s): HGBA1C in the last 72 hours. CBG: Recent Labs  Lab 04/17/19 0606 04/17/19 1146 04/17/19 1635 04/17/19 2006 04/18/19 0611  GLUCAP 112* 143* 98 133* 177*   Lipid Profile: No results for  input(s): CHOL, HDL, LDLCALC, TRIG, CHOLHDL, LDLDIRECT in the last 72 hours. Thyroid Function Tests: No results for input(s): TSH, T4TOTAL, FREET4, T3FREE, THYROIDAB in the last 72 hours. Anemia Panel: No results for input(s): VITAMINB12, FOLATE, FERRITIN, TIBC, IRON, RETICCTPCT in the last 72 hours. Urine analysis:    Component Value Date/Time   COLORURINE STRAW (A) 04/13/2019 0044   APPEARANCEUR CLEAR 04/13/2019 0044   LABSPEC 1.009 04/13/2019 0044   PHURINE 5.0 04/13/2019 0044   GLUCOSEU NEGATIVE 04/13/2019 0044   HGBUR NEGATIVE 04/13/2019 0044   BILIRUBINUR NEGATIVE 04/13/2019 0044   KETONESUR NEGATIVE 04/13/2019 0044   PROTEINUR NEGATIVE 04/13/2019 0044   NITRITE NEGATIVE 04/13/2019 0044   LEUKOCYTESUR NEGATIVE 04/13/2019 0044   Sepsis Labs: @LABRCNTIP (procalcitonin:4,lacticidven:4)  ) Recent Results (from the past 240 hour(s))  SARS Coronavirus 2 (CEPHEID - Performed in [facility] hospital lab), Hosp Order     Status: None   Collection Time: 04/11/19  6:54 PM   Specimen: Nasopharyngeal Swab  Result Value Ref Range Status   SARS Coronavirus 2 NEGATIVE NEGATIVE Final    Comment: (NOTE) If result is NEGATIVE SARS-CoV-2 target nucleic acids are NOT DETECTED. The SARS-CoV-2 RNA is generally detectable in upper and lower  respiratory specimens during the acute phase of infection. The lowest  concentration of SARS-CoV-2 viral copies this assay can detect is 250  copies / mL. A negative result does not preclude SARS-CoV-2 infection  and should not be used as the sole basis for treatment or other  patient management decisions.  A negative result may occur with  improper specimen collection / handling, submission of specimen other  than nasopharyngeal swab, presence of viral mutation(s) within the  areas targeted by this assay, and inadequate number of viral copies  (<250 copies / mL). A negative result must be combined with clinical  observations, patient history, and  epidemiological information. If result is POSITIVE SARS-CoV-2 target nucleic acids are DETECTED. The SARS-CoV-2 RNA is generally detectable in upper and lower  respiratory specimens dur ing the acute phase of infection.  Positive  results are indicative of active infection with SARS-CoV-2.  Clinical  correlation with patient history and other diagnostic information is  necessary to determine patient infection status.  Positive results do  not rule out bacterial infection or co-infection with other viruses. If result is PRESUMPTIVE POSTIVE SARS-CoV-2 nucleic acids MAY BE PRESENT.   A presumptive positive result was obtained on the submitted specimen  and confirmed on repeat testing.  While 2019 novel coronavirus  (SARS-CoV-2) nucleic acids may be present in the submitted sample  additional confirmatory testing may be necessary for epidemiological  and / or clinical management purposes  to differentiate between  SARS-CoV-2 and other Sarbecovirus currently known to infect humans.  If clinically indicated additional testing with an alternate test  methodology [phone]) is advised. The SARS-CoV-2 RNA is generally  detectable in upper and lower respiratory sp ecimens during the acute  phase of infection. The expected result is Negative. Fact Sheet for Patients:  StrictlyIdeas.no Fact Sheet for Healthcare Providers: BankingDealers.co.za This test is not yet approved or cleared by the [place] FDA and has been authorized for detection and/or diagnosis of SARS-CoV-2 by FDA under an Emergency Use Authorization (EUA).  This EUA will remain in effect (meaning this test can be used) for the duration of the COVID-19 declaration under Section 564(b)(1) of the Act, 21 U.S.C. section 360bbb-3(b)(1), unless the authorization is terminated or revoked sooner. Performed at [facility], [place] [address], [place], [place] [address]   Novel  Coronavirus,NAA,(SEND-OUT TO REF LAB - TAT 24-48 hrs); Hosp Order     Status: None   Collection Time: 04/12/19  4:03 PM   Specimen: Nasopharyngeal Swab; Respiratory  Result Value Ref Range Status   SARS-CoV-2, NAA NOT DETECTED NOT DETECTED Final    Comment: (NOTE) This test was developed and its performance characteristics determined by [employer]. This test has not been FDA cleared or approved. This test has been authorized by FDA under an Emergency Use Authorization (EUA). This test is only authorized for the duration of time the declaration that circumstances exist justifying the authorization of the emergency use of in vitro diagnostic tests for detection of SARS-CoV-2 virus and/or diagnosis of COVID-19 infection under section 564(b)(1) of the Act, 21 U.S.C. 408XKG-8(J)(8), unless the authorization is terminated or revoked sooner. When diagnostic testing is negative, the possibility of a false negative result should be considered in the context of a patient's recent exposures and the presence of clinical signs and symptoms consistent with COVID-19. An individual without symptoms of COVID-19 and who is not shedding SARS-CoV-2 virus would expect to have a negative (not detected) result in this assay. Performed  At: [facility] [address] [place], [place] [id number] Rush Farmer MD OV:[id number]    [place]  Final    Comment: Performed at [facility], [place] [address] [place], [place] [address]      Studies: No results found.  Scheduled Meds: . amLODipine  10 mg Oral Daily  . atorvastatin  20 mg Oral QHS  . docusate sodium  100 mg Oral BID  . ferrous sulfate  325 mg Oral Daily  . insulin aspart  0-15 Units Subcutaneous TID WC  . insulin aspart  0-5 Units Subcutaneous QHS  . ipratropium  0.5 mg Nebulization TID  . labetalol  200 mg Oral BID  . levalbuterol  2 puff Inhalation TID  . pantoprazole  40 mg Oral Daily  .  polyvinyl alcohol  2 drop Both Eyes BID  . rivaroxaban  20 mg Oral Q supper  . sodium chloride flush  3 mL Intravenous Q12H  . sodium chloride flush  3 mL Intravenous Q12H  . torsemide  40 mg Oral BID    Continuous Infusions: . sodium chloride       LOS: 7 days     Kayleen Memos, MD [facility] Pager [phone]  If 7PM-7AM, please contact night-coverage [email or web address] Password [email or web address] 04/18/2019, 12:15 PM

## 2019-04-18 NOTE — Progress Notes (Signed)
Patient ID: Benjamin [name], male   DOB: 1966/02/12, 53 y.o.   MRN: [id number]     Advanced Heart Failure Rounding Note  PCP-Cardiologist: Dorris Carnes, MD   Subjective:    Good diuresis yesterday, weight down 2 lbs.  Creatinine stable at 1.81.  Feeling better, legs less swollen.  Scrotum still swollen.  Slept better using CPAP.   RHC Procedural Findings: Hemodynamics (mmHg) RA mean 12 RV 37/18 PA 40/25, mean 31 PCWP mean 23 Oxygen saturations: PA 64% AO 100% Cardiac Output (Fick) 7.07  Cardiac Index (Fick) 2.47 PVR 1.1 WU   Objective:   Weight Range: (!) 185.8 kg Body mass index is 57.14 kg/m.   Vital Signs:   Temp:  [97.8 F (36.6 C)-98.2 F (36.8 C)] 97.9 F (36.6 C) (07/30 0539) Pulse Rate:  [72-91] 84 (07/30 0539) Resp:  [12-21] 19 (07/30 0539) BP: (112-163)/(65-131) 136/77 (07/30 0539) SpO2:  [93 %-100 %] 96 % (07/30 0539) Weight:  [185.8 kg] 185.8 kg (07/30 0500) Last BM Date: 04/16/19  Weight change: Filed Weights   04/16/19 0549 04/17/19 0559 04/18/19 0500  Weight: (!) 185.8 kg (!) 186.6 kg (!) 185.8 kg    Intake/Output:   Intake/Output Summary (Last 24 hours) at 04/18/2019 0757 Last data filed at 04/18/2019 0752 Gross per 24 hour  Intake 1677 ml  Output 4325 ml  Net -2648 ml      Physical Exam    General: NAD Neck: JVP difficult, appears to be around 8. No thyromegaly or thyroid nodule.  Lungs: Clear to auscultation bilaterally with normal respiratory effort. CV: Nonpalpable PMI.  Heart regular S1/S2, no S3/S4, no murmur.  1+ chronic ankle edema, unna boots in place.   Abdomen: Soft, nontender, no hepatosplenomegaly, no distention.  Skin: Intact without lesions or rashes.  Neurologic: Alert and oriented x 3.  Psych: Normal affect. Extremities: No clubbing or cyanosis.  HEENT: Normal.    Telemetry   NSR 70s (personally reviewed)  Labs    CBC Recent Labs    04/17/19 0855 04/18/19 0557  WBC  --  7.5  NEUTROABS  --  3.7  HGB  13.6  13.3 10.8*  HCT 40.0  39.0 35.9*  MCV  --  80.5  PLT  --  443*   Basic Metabolic Panel Recent Labs    04/17/19 0505 04/17/19 0855 04/18/19 0557  NA 135 137  138 135  K 4.1 4.2  4.1 4.4  CL 94*  --  95*  CO2 30  --  29  GLUCOSE 115*  --  174*  BUN 41*  --  41*  CREATININE 1.83*  --  1.81*  CALCIUM 9.3  --  8.9   Liver Function Tests No results for input(s): AST, ALT, ALKPHOS, BILITOT, PROT, ALBUMIN in the last 72 hours. No results for input(s): LIPASE, AMYLASE in the last 72 hours. Cardiac Enzymes No results for input(s): CKTOTAL, CKMB, CKMBINDEX, TROPONINI in the last 72 hours.  BNP: BNP (last 3 results) Recent Labs    03/11/19 1654 04/01/19 1902 04/11/19 1610  BNP 19.9 34.9 25.5    ProBNP (last 3 results) No results for input(s): PROBNP in the last 8760 hours.   D-Dimer No results for input(s): DDIMER in the last 72 hours. Hemoglobin A1C No results for input(s): HGBA1C in the last 72 hours. Fasting Lipid Panel No results for input(s): CHOL, HDL, LDLCALC, TRIG, CHOLHDL, LDLDIRECT in the last 72 hours. Thyroid Function Tests No results for input(s): TSH, T4TOTAL, T3FREE, THYROIDAB in  the last 72 hours.  Invalid input(s): FREET3  Other results:   Imaging    No results found.   Medications:     Scheduled Medications: . amLODipine  10 mg Oral Daily  . atorvastatin  20 mg Oral QHS  . docusate sodium  100 mg Oral BID  . ferrous sulfate  325 mg Oral Daily  . furosemide  80 mg Intravenous BID  . insulin aspart  0-15 Units Subcutaneous TID WC  . insulin aspart  0-5 Units Subcutaneous QHS  . ipratropium  0.5 mg Nebulization TID  . labetalol  200 mg Oral BID  . levalbuterol  2 puff Inhalation TID  . pantoprazole  40 mg Oral Daily  . polyvinyl alcohol  2 drop Both Eyes BID  . rivaroxaban  20 mg Oral Q supper  . sodium chloride flush  3 mL Intravenous Q12H  . sodium chloride flush  3 mL Intravenous Q12H  . torsemide  40 mg Oral BID     Infusions: . sodium chloride      PRN Medications: sodium chloride, acetaminophen, hydrALAZINE, methocarbamol, ondansetron (ZOFRAN) IV, oxyCODONE, polyethylene glycol, sodium chloride flush   Assessment/Plan   1. Acute on chronic diastolic CHF: Echo in 5/20 showed EF 60-65% but RV was poorly visualized.  He was initially diuresed with Lasix gtt 10 mg/hr and weight dropped 10 lbs.  However, creatinine increased from 1.24 => 2.38.  I suspect significant RV dysfunction in setting of OSA, unfortunately unable to see the RV well on last echo.  RHC on 7/29 showed elevated left and right heart filling pressures but not marked elevation.  Creatinine down to 1.8.  He got Lasix 80 mg IV bid yesterday with good diuresis, weight down 2 lbs.  He does not look markedly volume overloaded on exam today.   - Lasix 80 mg IV x 1 more dose this morning, then will transition to torsemide 40 mg bid for home.  I think that a lot of his peripheral edema may be chronic venous insufficiency that will likely not correct completely in the acute setting.  - Continue Unna boots.  2. AKI: Creatinine stable today at 1.8.  Will need to follow closely over time.  3. Atrial fibrillation: Paroxysmal.  He has been in NSR.  - Continue Xarelto.   4. OSA: Continue CPAP.  Will need to arrange for him to get CPAP at home, says his machine has been "broken."   5. Obesity: Weight loss is imperative.  6. H/o PE: 5/20.  Continue Xarelto.   7. H/o COVID-19 infection: Tested negative this admission.  8. HTN: BP high, restart his home amlodipine 10 mg daily.   I think he can go home this afternoon.  Would continue unna boots via home health.  He will need close followup in CHF clinic.  Cardiac meds for home: amlodipine 10 mg daily, labetalol 200 mg bid, Xarelto 20 daily, torsemide 40 bid, atorvastatin 20 daily, KCl 20 daily.   Length of Stay: 7  Marca Ancona, MD  04/18/2019, 7:57 AM  Advanced Heart Failure Team Pager [phone] (M-F;  7a - 4p)  Please contact [facility] for night-coverage after hours (4p -7a ) and weekends on [email or web address]

## 2019-04-19 LAB — BLOOD GAS, ARTERIAL
Acid-Base Excess: 9.5 mmol/L — ABNORMAL HIGH (ref 0.0–2.0)
Bicarbonate: 33.9 mmol/L — ABNORMAL HIGH (ref 20.0–28.0)
Drawn by: 331761
FIO2: 21
O2 Saturation: 91.6 %
Patient temperature: 98.4
pCO2 arterial: 49.5 mmHg — ABNORMAL HIGH (ref 32.0–48.0)
pH, Arterial: 7.449 (ref 7.350–7.450)
pO2, Arterial: 59.9 mmHg — ABNORMAL LOW (ref 83.0–108.0)

## 2019-04-19 LAB — BASIC METABOLIC PANEL
Anion gap: 11 (ref 5–15)
BUN: 36 mg/dL — ABNORMAL HIGH (ref 6–20)
CO2: 32 mmol/L (ref 22–32)
Calcium: 9.2 mg/dL (ref 8.9–10.3)
Chloride: 95 mmol/L — ABNORMAL LOW (ref 98–111)
Creatinine, Ser: 1.5 mg/dL — ABNORMAL HIGH (ref 0.61–1.24)
GFR calc Af Amer: >60 mL/min (ref >60)
GFR calc non Af Amer: 52 mL/min — ABNORMAL LOW (ref >60)
Glucose, Bld: 137 mg/dL — ABNORMAL HIGH (ref 70–99)
Potassium: 3.7 mmol/L (ref 3.5–5.1)
Sodium: 138 mmol/L (ref 135–145)

## 2019-04-19 LAB — GLUCOSE, CAPILLARY
Glucose-Capillary: 110 mg/dL — ABNORMAL HIGH (ref 70–99)
Glucose-Capillary: 86 mg/dL (ref 70–99)
Glucose-Capillary: 90 mg/dL (ref 70–99)

## 2019-04-19 LAB — CBC
HCT: 37.3 % — ABNORMAL LOW (ref 39.0–52.0)
Hemoglobin: 11.1 g/dL — ABNORMAL LOW (ref 13.0–17.0)
MCH: 23.9 pg — ABNORMAL LOW (ref 26.0–34.0)
MCHC: 29.8 g/dL — ABNORMAL LOW (ref 30.0–36.0)
MCV: 80.4 fL (ref 80.0–100.0)
Platelets: 401 10*3/uL — ABNORMAL HIGH (ref 150–400)
RBC: 4.64 MIL/uL (ref 4.22–5.81)
RDW: 16.2 % — ABNORMAL HIGH (ref 11.5–15.5)
WBC: 7 10*3/uL (ref 4.0–10.5)
nRBC: 0 % (ref 0.0–0.2)

## 2019-04-19 MED ORDER — LEVALBUTEROL HCL 0.63 MG/3ML IN NEBU: 0.6300 mg | INHALATION_SOLUTION | Freq: Four times a day (QID) | RESPIRATORY_TRACT | Status: DC | PRN

## 2019-04-19 MED ORDER — LEVALBUTEROL TARTRATE 45 MCG/ACT IN AERO: 2.0000 | INHALATION_SPRAY | Freq: Two times a day (BID) | RESPIRATORY_TRACT | Status: DC

## 2019-04-19 MED ORDER — RIVAROXABAN 20 MG PO TABS: 20.0000 mg | ORAL_TABLET | Freq: Every day | ORAL | 0 refills | Status: DC

## 2019-04-19 MED ORDER — AMLODIPINE BESYLATE 10 MG PO TABS: 10.0000 mg | ORAL_TABLET | Freq: Every day | ORAL | 0 refills | Status: DC

## 2019-04-19 MED ORDER — POTASSIUM CHLORIDE ER 10 MEQ PO TBCR: 20.0000 meq | EXTENDED_RELEASE_TABLET | Freq: Every day | ORAL | 0 refills | Status: DC

## 2019-04-19 MED ORDER — PANTOPRAZOLE SODIUM 40 MG PO TBEC: 40.0000 mg | DELAYED_RELEASE_TABLET | Freq: Every day | ORAL | 0 refills | Status: DC

## 2019-04-19 MED ORDER — ATORVASTATIN CALCIUM 20 MG PO TABS: 20.0000 mg | ORAL_TABLET | Freq: Every day | ORAL | 0 refills | Status: DC

## 2019-04-19 MED ORDER — IPRATROPIUM BROMIDE HFA 17 MCG/ACT IN AERS
2.0000 | INHALATION_SPRAY | Freq: Two times a day (BID) | RESPIRATORY_TRACT | Status: DC
  Filled 2019-04-19: qty 12.9

## 2019-04-19 MED ORDER — TORSEMIDE 20 MG PO TABS: 40.0000 mg | ORAL_TABLET | Freq: Two times a day (BID) | ORAL | 0 refills | Status: DC

## 2019-04-19 MED ORDER — LABETALOL HCL 200 MG PO TABS: 200.0000 mg | ORAL_TABLET | Freq: Two times a day (BID) | ORAL | 0 refills | Status: DC

## 2019-04-19 MED FILL — ?AMLODIPINE BESYLATE 10 MG: 10 | 30 days supply | Qty: 30 | Fill #0

## 2019-04-19 MED FILL — TORSEMIDE 20 MG TABLET: 20 | 30 days supply | Qty: 120 | Fill #0

## 2019-04-19 MED FILL — ?ATORVASTATIN 20 MG TABLET: 20 | 30 days supply | Qty: 30 | Fill #0

## 2019-04-19 MED FILL — XARELTO 20 MG TABLET: 20 | 30 days supply | Qty: 30 | Fill #0

## 2019-04-19 MED FILL — POTASSIUM CHLORIDE ER 10 ME: 10 | 30 days supply | Qty: 60 | Fill #0

## 2019-04-19 MED FILL — LABETALOL HCL 200 MG TABLET: 200 | 30 days supply | Qty: 60 | Fill #0

## 2019-04-19 NOTE — TOC Initial Note (Signed)
Transition of Care [facility]) - Initial/Assessment Note    Patient Details  Name: Benjamin [name] MRN: [id number] Date of Birth: Dec 20, 1965  Transition of Care [facility] CM/SW Contact:    Leone Haven, RN Phone Number: 04/19/2019, 4:56 PM  Clinical Narrative:                 Patient is from half way house, he will need med ast, cpap with (LOG), Match provided.  Meds picked up at [facility] clinic, unable to find [facility] for unaboots, consulting HF team, order for ted hose, staff RN aware   and will place  Ted hose on patient .  Rhonda with [employer] will have cpap delivered today or tomorrow to the half way house for patient. He has transport via the halfway house.   Expected Discharge Plan: Home w Home Health Services Barriers to Discharge: No Barriers Identified   Patient Goals and CMS Choice Patient states their goals for this hospitalization and ongoing recovery are:: go home CMS Medicare.gov Compare Post Acute Care list provided to:: Patient Choice offered to / list presented to : Patient  Expected Discharge Plan and Services Expected Discharge Plan: Home w Home Health Services   Discharge Planning Services: CM Consult   Living arrangements for the past 2 months: Boarding House Expected Discharge Date: 04/19/19               DME Arranged: Continuous positive airway pressure (CPAP) DME Agency: [facility] Date DME Agency Contacted: 04/19/19 Time DME Agency Contacted: [phone] Representative spoke with at DME Agency: Bjorn Loser HH Arranged: NA          Prior Living Arrangements/Services Living arrangements for the past 2 months: [employer] Lives with:: Other (Comment)(half way house) Patient language and need for interpreter reviewed:: Yes Do you feel safe going back to the place where you live?: Yes      Need for Family Participation in Patient Care: No (Comment) Care giver support system in place?: No (comment) Current home services: DME Criminal Activity/Legal  Involvement Pertinent to Current Situation/Hospitalization: No - Comment as needed  Activities of Daily Living Home Assistive Devices/Equipment: None ADL Screening (condition at time of admission) Patient's cognitive ability adequate to safely complete daily activities?: Yes Is the patient deaf or have difficulty hearing?: No Does the patient have difficulty seeing, even when wearing glasses/contacts?: No Does the patient have difficulty concentrating, remembering, or making decisions?: No Patient able to express need for assistance with ADLs?: Yes Does the patient have difficulty dressing or bathing?: No Independently performs ADLs?: Yes (appropriate for developmental age) Does the patient have difficulty walking or climbing stairs?: Yes Weakness of Legs: Both Weakness of Arms/Hands: None  Permission Sought/Granted                  Emotional Assessment Appearance:: Appears stated age Attitude/Demeanor/Rapport: (appropriate) Affect (typically observed): Appropriate Orientation: : Oriented to Self, Oriented to Place, Oriented to  Time, Oriented to Situation   Psych Involvement: No (comment)  Admission diagnosis:  Edema Patient Active Problem List   Diagnosis Date Noted  . Acute on chronic diastolic CHF (congestive heart failure) (HCC) 04/11/2019  . Iron deficiency anemia 03/11/2019  . OSA on CPAP 03/11/2019  . HLD (hyperlipidemia) 03/11/2019  . Elevated troponin 03/11/2019  . GERD (gastroesophageal reflux disease) 03/11/2019  . Rectal bleeding 02/20/2019  . Dyspnea 02/06/2019  . COVID-19 virus infection 02/06/2019  . Bilateral lower extremity edema   . Obesity, Class III, BMI 40-49.9 (morbid  obesity) ([place])   . Chronic diastolic CHF (congestive heart failure) ([place])   . PAF (paroxysmal atrial fibrillation) ([place])   . PE (pulmonary thromboembolism) ([place]) 01/21/2019  . Hypertensive urgency 01/21/2019  . Diabetes mellitus type 2 in obese ([place]) 01/21/2019   PCP:  No  primary care provider on file. Pharmacy:   [facility] [address] [place] [address] Phone: [phone] Fax: [phone]     Social Determinants of Health (SDOH) Interventions    Readmission Risk Interventions Readmission Risk Prevention Plan 04/19/2019  Transportation Screening Complete  PCP or Specialist Appt within 3-5 Days Complete  HRI or [place] Complete  Social Work Consult for [place] Planning/Counseling Complete  Palliative Care Screening Not Applicable  Medication Review Press photographer) Complete  Some recent data might be hidden

## 2019-04-19 NOTE — Evaluation (Signed)
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   Clinical Impression   PTA Pt was independent in ADL and mobility without DME, living in halfway house with lots of step. Fatigue and SOB was a problem. Pt was able to perform ADL at baseline. Fit for scrotal sling for elevation, edema/pain management, and slight compression. Pt reports relief and much improved pain during transfers/ADL and mobility. Pt SOB with activity SpO2 remains > 90 throughout. Verbally educated in energy conservation. No OT Follow UP at this time. Since plan is to dc today.    Follow Up Recommendations  No OT follow up    Equipment Recommendations  None recommended by OT    Recommendations for Other Services       Precautions / Restrictions Precautions Required Braces or Orthoses: Other Brace Other Brace: scrotal sling Restrictions Weight Bearing Restrictions: No      Mobility Bed Mobility               General bed mobility comments: oOB in recliner at beginning and end of session  Transfers Overall transfer level: Modified independent Equipment used: None                  Balance Overall balance assessment: No apparent balance deficits (not formally assessed)                                         ADL either performed or assessed with clinical judgement   ADL Overall ADL's : At baseline                                       General ADL Comments: able to manage pants, grooming tasks, etc. Pt does get SOB, however O2 saturations remain >90% throughout.  Pt verbally educated in energy conservation techniques     Vision         Perception     Praxis      Pertinent Vitals/Pain Pain Assessment: Faces Faces Pain Scale: Hurts little more Pain Location: scrotum Pain Descriptors / Indicators: Heaviness Pain Intervention(s): Monitored during session;Repositioned;Other (comment)(scrotal sling)     Hand Dominance Right   Extremity/Trunk Assessment Upper Extremity Assessment Upper Extremity Assessment: Overall WFL for tasks assessed   Lower Extremity Assessment Lower Extremity Assessment: Defer to PT evaluation   Cervical / Trunk Assessment Cervical / Trunk Assessment: Other exceptions Cervical / Trunk Exceptions: obese   Communication Communication Communication: No difficulties   Cognition Arousal/Alertness: Awake/alert Behavior During Therapy: WFL for tasks assessed/performed Overall Cognitive Status: Within Functional Limits for tasks assessed                                     General Comments  educated on importance of scrotal sling for edema/pain management - elevation, slight compression, and support during transfers/mobility/ADL    Exercises     Shoulder Instructions      Home Living Family/patient expects to be discharged to:: Private residence(halfway house) Living  Arrangements: Non-relatives/Friends Available Help at Discharge: Friend(s) Type of Home: Group Home Home Access: Stairs to enter Entrance Stairs-Number of Steps: 5   Home Layout: Multi-level Alternate Level Stairs-Number of Steps: flight   Bathroom Shower/Tub: Teacher, early years/pre: Standard     Home Equipment: None   Additional Comments: "I have lots of help whenever I need it"      Prior Functioning/Environment Level of Independence: Independent        Comments: recovering from recent hospitalization with COVID, but doing own dressing/bathing/ambulating without DME        OT Problem List: Decreased  activity tolerance;Cardiopulmonary status limiting activity;Obesity;Pain;Increased edema      OT Treatment/Interventions:      OT Goals(Current goals can be found in the care plan section) Acute Rehab OT Goals Patient Stated Goal: "get this swelling down" OT Goal Formulation: With patient Time For Goal Achievement: 05/03/19 Potential to Achieve Goals: Good  OT Frequency:     Barriers to D/C:            Co-evaluation PT/OT/SLP Co-Evaluation/Treatment: Yes Reason for Co-Treatment: To address functional/ADL transfers PT goals addressed during session: Mobility/safety with mobility;Balance;Other (comment)(O2) OT goals addressed during session: ADL's and self-care;Other (comment)(scrotal sling and O2)      AM-PAC OT "6 Clicks" Daily Activity     Outcome Measure Help from another person eating meals?: None Help from another person taking care of personal grooming?: None Help from another person toileting, which includes using toliet, bedpan, or urinal?: None Help from another person bathing (including washing, rinsing, drying)?: None Help from another person to put on and taking off regular upper body clothing?: None Help from another person to put on and taking off regular lower body clothing?: None 6 Click Score: 24   End of Session Equipment Utilized During Treatment: Gait belt;Other (comment)(scrotal sling) Nurse Communication: Mobility status;Other (comment)(sling in place)  Activity Tolerance: Patient tolerated treatment well Patient left: in chair;with call bell/phone within reach  OT Visit Diagnosis: Other abnormalities of gait and mobility (R26.89);Pain Pain - Right/Left: (BLE) Pain - part of body: Leg(and scrotum)                Time: 4268-3419 OT Time Calculation (min): 26 min Charges:  OT General Charges $OT Visit: 1 Visit OT Evaluation $OT Eval Moderate Complexity: Melvin OTR/L Acute Rehabilitation Services Pager: [phone] Office:  [place] 04/19/2019, 4:40 PM

## 2019-04-19 NOTE — Progress Notes (Signed)
CARDIAC REHAB PHASE I   Checked in on pt today. Pt states appreciation and understanding of HF education. Pt agreeable to fluid restrictions, low sodium diet, and ambulation. Pt states he feels a "spark of motivation that he hasn't in a while". Encouraged continued energy and motivation. Hopeful for d/c today.   [id number] Rufina Falco, RN BSN 04/19/2019 9:00 AM

## 2019-04-19 NOTE — Progress Notes (Signed)
PT refused Blood Gas. RT expressed that the doctor wanted this, and ordered this. PT was willing at first but when seeing the needle refused.

## 2019-04-19 NOTE — Discharge Summary (Addendum)
Discharge Summary  Benjamin [name] TKT:[id number] DOB: [birth year]  PCP: No primary care provider on file.  Admit date: 04/11/2019 Discharge date: 04/19/2019  Time spent: 35 minutes  Recommendations for Outpatient Follow-up:  1. Follow-up with cardiology/heart failure team 2. Follow-up with pulmonology 3. Follow-up with your primary care provider 4. Take your medications as prescribed 5. Home health services for [place] exchange  Discharge Diagnoses:  Active Hospital Problems   Diagnosis Date Noted   Acute on chronic diastolic CHF (congestive heart failure) (HCC) 04/11/2019   GERD (gastroesophageal reflux disease) 03/11/2019   HLD (hyperlipidemia) 03/11/2019   OSA on CPAP 03/11/2019   COVID-19 virus infection 02/06/2019   Bilateral lower extremity edema    Chronic diastolic CHF (congestive heart failure) (HCC)    Obesity, Class III, BMI 40-49.9 (morbid obesity) (HCC)    PAF (paroxysmal atrial fibrillation) ([place])    Diabetes mellitus type 2 in obese ([place]) 01/21/2019   PE (pulmonary thromboembolism) ([place]) 01/21/2019    Resolved Hospital Problems  No resolved problems to display.    Discharge Condition: Stable  Diet recommendation: Heart healthy low salt carb modified diet  Vitals:   04/19/19 0952 04/19/19 1148  BP: (!) 138/107 (!) 147/101  Pulse: 83 80  Resp:  (!) 21  Temp:  99.1 F (37.3 C)  SpO2:  99%    History of present illness:  TimothyWilliamsis a53 y.o.male,with medical history significant ofdCHF,hypertension, hyperlipidemia, diabetes mellitus, GERD, atrial fibrillationandPE on Xarelto,OSA on CPAP, iron deficiency anemia, morbid obesity, chronic elevation of troponin,as well recent COVID-19 infection. Patient presents secondary to worsening lower extremity edema and scrotal edema, she had recent ED visit for the same, patient report he has been compliant with his medications, including Lasix 80 mg 3 times a day, reports his legs feel  very heavy, and uncomfortable, as well as scrotum and significantly swollen as well limiting his movement, upon further questioning, patient report he is compliant with fluid restriction, salt restriction, reports mildly worsening dyspnea, mainly upon laying supine, denies any chest pain, cough, fever, chills, nausea, vomiting, hemoptysis, diarrhea, or blood per rectum. - in EDno significant labs abnormalities, oxygen requirement at baseline, but patient was noted to have significantly swollen scrotum and +2 lower extremity edema, received 80 mg IV Lasix as I was called to admit for volume overload.  Started on lasix drip and ongoing diuresing with improvement of volume status.  Unna boots placed, continue via home health services.  Will need close follow-up in CHF clinic.  Cardiac medications as recommended by cardiology: Amlodipine 10 mg daily, labetalol 200 mg bid, Xarelto 20 daily, torsemide 40 bid, atorvastatin 20 daily, KCl 20 daily.    04/19/19: Patient was seen and examined at his bedside.  He has no new complaints.  Denies chest pain, palpitations or dyspnea.  Home O2 evaluation prior to discharge.  On the day of discharge, the patient was hemodynamically stable.  He will need to follow-up with heart failure team, his primary care provider, urology and pulmonology for possible obstructive sleep apnea evaluation posthospitalization.  Patient understands and agrees to plan.  Hospital Course:  Active Problems:   PE (pulmonary thromboembolism) (HCC)   Diabetes mellitus type 2 in obese (HCC)   Bilateral lower extremity edema   Obesity, Class III, BMI 40-49.9 (morbid obesity) (HCC)   Chronic diastolic CHF (congestive heart failure) (HCC)   PAF (paroxysmal atrial fibrillation) ([place])   COVID-19 virus infection   OSA on CPAP   HLD (hyperlipidemia)   GERD (gastroesophageal  disease) °  Acute on chronic diastolic CHF (congestive heart failure) (HCC) ° °Improving severe Anasarca/volume  overload possibly secondary to acute on chronic diastolic CHF  °Presented with significant volume overload with 3+ pitting edema in bilateral lower extremities, significant scrotal edema °Last CMP 03/12/2019 with normal LFTs. 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 °Baseline creatinine appears to be 1.2 with GFR greater than 60 °Creatinine today 1.5 with GFR greater than 60 °Continue to avoid nephrotoxins °Follow-up with your primary care provider °  °Paroxysmal A. Fib °Currently in sinus rhythm °Rate controlled, on labetalol °On Xarelto for CVA prevention and history of PE DVT °  °Scrotal edema/Large left spermatocele? with tenderness likely secondary to fluid overload °Curbside with Benjamin [name], urology °Scrotal US revealed: Normal appearing testes with small BILATERAL hydroceles. °Loculated cysts adjacent to LEFT testis question spermatoceles °versus epididymal cysts largest 5.6 cm in greatest size. °Curbsided with urology BenjaminWrenn who looked at the labs and US. We will continue to diurese, if his edema resolves and his cardiac risk is acceptable and still symptomatic he could be considered for surgical repair however he may not be a good candidate. °Follow-up with urology outpatient.  Please call for a post hospital follow-up appointment. °  °Acute on chronic diastolic CHF °BNP 25. Last BNP 34 from 04/01/2019 °Completed Lasix drip and 1 dose xaroxylyn °Continue strict I's and O's and daily weight °on torsemide 40 mg BID starting 04/18/19, continue °Follow-up with heart failure clinic °Continue Unna boot with home health services °  °Acute on chronic hypoxic respiratory failure likely multifactorial in the setting of fluid overload, improving  °On oxygen supplementation 2L at baseline °Continue bronchodilators °Maintain O2  saturation greater than 92% °O2 saturation at rest 99% on room air. °Home O2 evaluation prior to discharge °  °Recent COVID-19 infection °First dx positive covid-19 in 02/06/19, admitted to [facility] and discharged after 6 days on O2, recommended cardiology follow up. °Afebrile °Denies chest pain or dyspnea or cough.  Noted prior positive COVID-19 on 03/11/2019, repeated COVID-19 test negative on 04/11/2019.  Independently reviewed chest x-ray done on admission which showed markings similar to prior chest x-ray done on 04/01/19, improved from cxr done on 03/11/19, markings more prominent at bases.  °Inflammatory markers trended down °Negative covid x 2 this admission including 1 negative send out test °Vital signs reviewed and are stable. °  °Resolved Uncontrolled hypertension °Blood pressure is at goal °Cardiac medications as recommended by cardiology/heart failure team: °Amlodipine 10 mg daily, labetalol 200 mg bid, Xarelto 20 daily, torsemide 40 bid, atorvastatin 20 daily, KCl 20 daily.  °  °Severe Morbid obesity °BMI 57 °Recommend weight loss outpatient with regular physical activity and healthy diet °Follow-up with your primary care provider °  °Type 2 diabetes with hyperglycemia °Last hemoglobin A1c 7.6 on 01/23/2019 °GFR greater than 60 °Resume metformin °Follow-up with your primary care provider °  °History of PE and DVT °Continue xarelto °Maintain O2 saturation greater than 92% °Home O2 evaluation upon ambulation prior to discharge °  °OSA °Continue CPAP at night °Keep appointment at the pulmonology clinic for OSA evaluation °  °  °Code Status: Full code  °  °  °Consultants: °· Cardiology °· Curbsided with urology, Benjamin [name] on 04/14/19. °  °Procedures: °· None  °  °Antimicrobials: °· None °  °DVT prophylaxis: ° Xarelto °  ° °Discharge Exam: °BP (!) 147/101 (BP Location: Left Wrist)      Wrist)    Pulse 80    Temp 99.1 F (37.3 C) (Oral)    Resp (!) 21    Ht 5' 11" (1.803 m)    Wt (!) 185 kg    SpO2 99%    BMI 56.89 kg/m    General: 53 y.o. year-old male severe morbid obesity.  In no acute distress.  Alert and oriented x3.  Cardiovascular: Regular rate and rhythm with no rubs or gallops.  No thyromegaly or JVD noted.    Respiratory: Clear to auscultation with no wheezes or rales. Good inspiratory effort.  Abdomen: Soft nontender nondistended with normal bowel sounds x4 quadrants.  Musculoskeletal: Unna boots in place.    Psychiatry: Mood is appropriate for condition and setting  Discharge Instructions You were cared for by a hospitalist during your hospital stay. If you have any questions about your discharge medications or the care you received while you were in the hospital after you are discharged, you can call the unit and asked to speak with the hospitalist on call if the hospitalist that took care of you is not available. Once you are discharged, your primary care physician will handle any further medical issues. Please note that NO REFILLS for any discharge medications will be authorized once you are discharged, as it is imperative that you return to your primary care physician (or establish a relationship with a primary care physician if you do not have one) for your aftercare needs so that they can reassess your need for medications and monitor your lab values.  Discharge Instructions    Amb Referral to Cardiac Rehabilitation   Complete by: As directed    Diagnosis: Heart Failure (see criteria below if ordering Phase II)   Heart Failure Type: Chronic Diastolic   After initial evaluation and assessments completed: Virtual Based Care may be provided alone or in conjunction with Phase 2 Cardiac Rehab based on patient barriers.: Yes     Allergies as of 04/19/2019   No Known Allergies     Medication List    STOP taking these medications   furosemide 80 MG tablet Commonly known as: LASIX   lisinopril 40 MG tablet Commonly known as: ZESTRIL   methocarbamol 500 MG tablet Commonly known as:  ROBAXIN   omeprazole 40 MG capsule Commonly known as: PRILOSEC Replaced by: pantoprazole 40 MG tablet   spironolactone 50 MG tablet Commonly known as: ALDACTONE     TAKE these medications   amLODipine 10 MG tablet Commonly known as: NORVASC Take 1 tablet (10 mg total) by mouth daily. Start taking on: April 20, 2019 What changed:   when to take this  additional instructions   atorvastatin 20 MG tablet Commonly known as: LIPITOR Take 1 tablet (20 mg total) by mouth at bedtime.   carboxymethylcellulose 0.5 % Soln Commonly known as: REFRESH PLUS Place 1 drop into both eyes daily.   docusate sodium 100 MG capsule Commonly known as: COLACE Take 1 capsule (100 mg total) by mouth 2 (two) times daily.   ferrous sulfate 325 (65 FE) MG tablet Take 1 tablet (325 mg total) by mouth daily. Patient will pick up scripts today.   labetalol 200 MG tablet Commonly known as: NORMODYNE Take 1 tablet (200 mg total) by mouth 2 (two) times daily. What changed:   when to take this  additional instructions   metFORMIN 500 MG tablet Commonly known as: GLUCOPHAGE Take 1 tablet (500 mg total) by mouth 2 (two) times a day. Patient will  pick up scripts today. What changed: when to take this   pantoprazole 40 MG tablet Commonly known as: PROTONIX Take 1 tablet (40 mg total) by mouth daily. Start taking on: April 20, 2019 Replaces: omeprazole 40 MG capsule   polyethylene glycol 17 g packet Commonly known as: MIRALAX / GLYCOLAX Take 17 g by mouth daily as needed for severe constipation.   potassium chloride 10 MEQ tablet Commonly known as: K-DUR Take 2 tablets (20 mEq total) by mouth daily.   rivaroxaban 20 MG Tabs tablet Commonly known as: XARELTO Take 1 tablet (20 mg total) by mouth daily with supper. What changed:   when to take this  additional instructions   torsemide 20 MG tablet Commonly known as: DEMADEX Take 2 tablets (40 mg total) by mouth 2 (two) times daily.    triamcinolone cream 0.1 % Commonly known as: KENALOG Apply 1 application topically 3 (three) times daily as needed (rash/irritation).   True Metrix Blood Glucose Test test strip Generic drug: glucose blood Use as instructed. Check blood glucose level by fingerstick twice per day.   True Metrix Meter w/Device Kit Use as instructed. Check blood glucose level by fingerstick twice per day.   TRUEplus Lancets 28G Misc Use as instructed. Check blood glucose level by fingerstick twice per day.            Durable Medical Equipment  (From admission, onward)         Start     Ordered   04/19/19 1624  For home use only DME continuous positive airway pressure (CPAP)  Once    Comments: RT to do autosetting  Question Answer Comment  Length of Need Lifetime   Patient has OSA or probable OSA Yes   Is the patient currently using CPAP in the home No   Date of face to face encounter 04/19/19   Settings Other see comments   Signs and symptoms of probable OSA  (select all that apply) Snoring   Signs and symptoms of probable OSA  (select all that apply) Moring headaches   Signs and symptoms of probable OSA  (select all that apply) Witnessed apneas   Signs and symptoms of probable OSA  (select all that apply) Gasping during sleep   CPAP supplies needed Mask, headgear, cushions, filters, heated tubing and water chamber      04/19/19 1625         No Known Allergies Follow-up Information    [facility] On 04/24/2019.   Specialty: Cardiology Why: at 2:00 [place] information: [address] [phone] mc [place]. Go on 04/30/2019.   Why: _0 :30am Contact information: [place] [address] [phone]       [name], MD. Call in 1 day(s).   Specialty: Pulmonary Disease Why: You have  an appointment with Dr. Halford Chessman on 05/09/2019 at 10:30 AM.  Please arrive 15 minutes early. Contact information: [address] [place] [address] [phone]        Irine Seal, MD. Call in 1 day(s).   Specialty: Urology Why: Please call for a post hospital follow-up appointment. Contact information: [address] [place] [address] [phone]            The results of significant diagnostics from this hospitalization (including imaging, microbiology, ancillary and laboratory) are listed below  Significant Diagnostic Studies: °Dg Chest Portable 1 View ° °Result Date: 04/11/2019 °CLINICAL DATA:  Fluid retention and shortness of breath. Recent positive COVID-19 test on 03/11/2019. EXAM: PORTABLE CHEST 1 VIEW COMPARISON:  04/01/2019 FINDINGS: Cardiac silhouette is mildly enlarged. No mediastinal or hilar masses. Lungs demonstrate prominent bronchovascular markings similar to prior exams. No lung consolidation. No pleural effusion or pneumothorax. Skeletal structures are grossly intact. IMPRESSION: No acute cardiopulmonary disease. Electronically Signed   By: David  Ormond M.D.   On: 04/11/2019 14:54  ° °Dg Chest Portable 1 View ° °Result Date: 04/01/2019 °CLINICAL DATA:  Acute onset shortness of breath and BILATERAL LOWER extremity edema. COVID-19 positive on 03/11/2019. Former smoker. Current history of hypertension, diabetes, and sleep apnea. EXAM: PORTABLE CHEST 1 VIEW COMPARISON:  03/11/2019 and earlier. FINDINGS: Cardiac silhouette upper normal in size to slightly enlarged, unchanged. Lungs clear. Bronchovascular markings normal. Pulmonary vascularity normal. No visible pleural effusions. No pneumothorax. Stable mild elevation of the RIGHT hemidiaphragm. IMPRESSION: No acute cardiopulmonary disease. Electronically Signed   By: Thomas  Lawrence M.D.   On: 04/01/2019 20:27  ° °Us Scrotum W/doppler ° °Result Date: 04/12/2019 °CLINICAL DATA:  Edema EXAM:  SCROTAL ULTRASOUND DOPPLER ULTRASOUND OF THE TESTICLES TECHNIQUE: Complete ultrasound examination of the testicles, epididymis, and other scrotal structures was performed. Color and spectral Doppler ultrasound were also utilized to evaluate blood flow to the testicles. COMPARISON:  None FINDINGS: Right testicle Measurements: 4.1 x 2.9 x 2.5 cm. Normal echogenicity without mass. Few scattered tiny microcalcifications, nonspecific. Blood flow present within RIGHT testis on color Doppler imaging. Left testicle Measurements: 4.0 x 2.6 x 3.1 cm. Normal echogenicity without mass or calcifications. Few tiny microcalcifications. Blood flow present within LEFT testis on color Doppler imaging, symmetric with RIGHT Right epididymis:  Tiny cyst RIGHT epididymal head 5 mm diameter. Left epididymis: Large loculated cysts adjacent to the LEFT testis, favors spermatoceles versus epididymal cysts rather than loculated hydrocele. These measure up to 5.6 x 4.0 x 3.4 cm. Hydrocele:  BILATERAL hydroceles Varicocele:  None identified Pulsed Doppler interrogation of both testes demonstrates normal low resistance arterial and venous waveforms bilaterally. Examination limited secondary to body habitus. Mild scrotal wall edema noted. IMPRESSION: Normal appearing testes with small BILATERAL hydroceles. Loculated cysts adjacent to LEFT testis question spermatoceles versus epididymal cysts largest 5.6 cm in greatest size. Electronically Signed   By: Mark  Boles M.D.   On: 04/12/2019 18:53  ° ° °Microbiology: °Recent Results (from the past 240 hour(s))  °SARS Coronavirus 2 (CEPHEID - Performed in [place] hospital lab), Hosp Order     Status: None  ° Collection Time: 04/11/19  6:54 PM  ° Specimen: Nasopharyngeal Swab  °Result Value Ref Range Status  ° SARS Coronavirus 2 NEGATIVE NEGATIVE Final  °  Comment: (NOTE) °If result is NEGATIVE °SARS-CoV-2 target nucleic acids are NOT DETECTED. °The SARS-CoV-2 RNA is generally detectable in upper and  lower  °respiratory specimens during the acute phase of infection. The lowest  °concentration of SARS-CoV-2 viral copies this assay can detect is 250  °copies / mL. A negative result does not preclude SARS-CoV-2 infection  °and should not be used as the sole basis for treatment or other  °patient management decisions.  A negative result may occur with  °improper specimen collection / handling, submission of specimen other  °than nasopharyngeal swab, presence of viral mutation(s) within the  °areas targeted by this assay, and inadequate number of viral copies  °(<250 copies / mL). A negative result must be   combined with clinical  °observations, patient history, and epidemiological information. °If result is POSITIVE °SARS-CoV-2 target nucleic acids are DETECTED. °The SARS-CoV-2 RNA is generally detectable in upper and lower  °respiratory specimens dur °ing the acute phase of infection.  Positive  °results are indicative of active infection with SARS-CoV-2.  Clinical  °correlation with patient history and other diagnostic information is  °necessary to determine patient infection status.  Positive results do  °not rule out bacterial infection or co-infection with other viruses. °If result is PRESUMPTIVE POSTIVE °SARS-CoV-2 nucleic acids MAY BE PRESENT.   °A presumptive positive result was obtained on the submitted specimen  °and confirmed on repeat testing.  While 2019 novel coronavirus  °(SARS-CoV-2) nucleic acids may be present in the submitted sample  °additional confirmatory testing may be necessary for epidemiological  °and / or clinical management purposes  to differentiate between  °SARS-CoV-2 and other Sarbecovirus currently known to infect humans.  °If clinically indicated additional testing with an alternate test  °methodology ([id number]) is advised. The SARS-CoV-2 RNA is generally  °detectable in upper and lower respiratory sp °ecimens during the acute  °phase of infection. °The expected result is  Negative. °Fact Sheet for Patients:  https://www.fda.gov/media/136312/download °Fact Sheet for Healthcare Providers: °https://www.fda.gov/media/136313/download °This test is not yet approved or cleared by the [place] FDA and °has been authorized for detection and/or diagnosis of SARS-CoV-2 by °FDA under an Emergency Use Authorization (EUA).  This EUA will remain °in effect (meaning this test can be used) for the duration of the °COVID-19 declaration under Section 564(b)(1) of the Act, 21 U.S.C. °section 360bbb-3(b)(1), unless the authorization is terminated or °revoked sooner. °Performed at [facility], [address] [place], [place] °[address] °  °Novel Coronavirus,NAA,(SEND-OUT TO REF LAB - TAT 24-48 hrs); Hosp Order     Status: None  ° Collection Time: 04/12/19  4:03 PM  ° Specimen: Nasopharyngeal Swab; Respiratory  °Result Value Ref Range Status  ° SARS-CoV-2, NAA NOT DETECTED NOT DETECTED Final  °  Comment: (NOTE) °This test was developed and its performance characteristics °determined by [facility]. This test has not been FDA °cleared or approved. This test has been authorized by FDA under an °Emergency Use Authorization (EUA). This test is only authorized for °the duration of time the declaration that circumstances exist °justifying the authorization of the emergency use of in vitro °diagnostic tests for detection of SARS-CoV-2 virus and/or diagnosis °of COVID-19 infection under section 564(b)(1) of the Act, 21 U.S.C. °360bbb-3(b)(1), unless the authorization is terminated or revoked °sooner. When diagnostic testing is negative, the possibility of a °false negative result should be considered in the context of a °patient's recent exposures and the presence of clinical signs and °symptoms consistent with COVID-19. An individual without symptoms of °COVID-19 and who is not shedding SARS-CoV-2 virus would expect to °have a negative (not detected) result in this assay. °Performed  °At: [facility]  [facility] °[address] [place], [place] [id number] °Nagendra Sanjai MD Ph:[phone] °  ° Coronavirus Source NASOPHARYNGEAL  Final  °  Comment: Performed at [facility], [address] [place], [place] [address]  °  ° °Labs: °Basic Metabolic Panel: °Recent Labs  °Lab 04/13/19 °0654  04/15/19 °0641 04/16/19 °0655 04/17/19 °0505 04/17/19 °0855 04/18/19 °0557 04/19/19 °0942  °NA 137   < > 135 135 135 137   138 135 138  °K 3.7   < > 3.9 4.1 4.1 4.2   4.1 4.4 3.7  °CL 97*   < >   92* 93* 94*  --  95* 95*  °CO2 30   < > 30 29 30  --  29 32  °GLUCOSE 137*   < > 140* 114* 115*  --  174* 137*  °BUN 16   < > 27* 40* 41*  --  41* 36*  °CREATININE 1.30*   < > 1.89* 2.38* 1.83*  --  1.81* 1.50*  °CALCIUM 8.8*   < > 9.3 9.0 9.3  --  8.9 9.2  °MG 1.9  --   --   --   --   --   --   --   ° < > = values in this interval not displayed.  ° °Liver Function Tests: °No results for input(s): AST, ALT, ALKPHOS, BILITOT, PROT, ALBUMIN in the last 168 hours. °No results for input(s): LIPASE, AMYLASE in the last 168 hours. °No results for input(s): AMMONIA in the last 168 hours. °CBC: °Recent Labs  °Lab 04/17/19 °0855 04/18/19 °0557 04/19/19 °1307  °WBC  --  7.5 7.0  °NEUTROABS  --  3.7  --   °HGB 13.6   13.3 10.8* 11.1*  °HCT 40.0   39.0 35.9* 37.3*  °MCV  --  80.5 80.4  °PLT  --  410* 401*  ° °Cardiac Enzymes: °No results for input(s): CKTOTAL, CKMB, CKMBINDEX, TROPONINI in the last 168 hours. °BNP: °BNP (last 3 results) °Recent Labs  °  03/11/19 °1654 04/01/19 °1902 04/11/19 °1610  °BNP 19.9 34.9 25.5  ° ° °ProBNP (last 3 results) °No results for input(s): PROBNP in the last 8760 hours. ° °CBG: °Recent Labs  °Lab 04/18/19 °1219 04/18/19 °1709 04/18/19 °2050 04/19/19 °0640 04/19/19 °1150  °GLUCAP 118* 112* 128* 110* 86  ° ° ° ° ° °Signed: ° ° N , MD °[facility] °04/19/2019, 4:26 PM °

## 2019-04-19 NOTE — Progress Notes (Signed)
Patient discharged to [facility] by [place], [place].  Peripheral IV removed, UNNa boots removed, TED hose placed.  CPAP ordered by Case Management with information provided in AVS regading who to call to obtain CPAP.  AVS education provided and medications delivered to bedside prior to discharge.

## 2019-04-19 NOTE — Progress Notes (Signed)
Orthopedic Tech Progress Note Patient Details:  Benjamin [name] 04/24/1966 [id number]  Patient ID: Benjamin [name], male   DOB: 1966/07/15, 53 y.o.   MRN: [id number]   [place] Pink 04/19/2019, 6:07 PMremove bilateral unna boots

## 2019-04-19 NOTE — Progress Notes (Signed)
04/19/19 1659  PT Visit Information  Last PT Received On 04/19/19  Assistance Needed +1  PT/OT/SLP Co-Evaluation/Treatment Yes  Reason for Co-Treatment To address functional/ADL transfers  PT goals addressed during session Mobility/safety with mobility;Other (comment) (O2 during ambulation)  History of Present Illness TimothyWilliamsis a53 y.o.male,with medical history significant ofdCHF,hypertension, hyperlipidemia, diabetes mellitus, GERD, atrial fibrillationandPE on Xarelto,OSA on CPAP, iron deficiency anemia, morbid obesity, chronic elevation of troponin,as well recent COVID-19 infection. Presenting with increased SOB and swelling in BLE.   Precautions  Precautions None  Required Braces or Orthoses Other Brace  Other Brace scrotal sling  Restrictions  Weight Bearing Restrictions No  Home Living  Family/patient expects to be discharged to: Private residence (halfway house)  Living Arrangements Non-relatives/Friends  Available Help at Discharge Friend(s)  Type of Home Group Home  Home Access Stairs to enter  Entrance Stairs-Number of Steps 5  Entrance Stairs-Rails Right  Home Layout Multi-level  Alternate Level Stairs-Number of Steps flight  Alternate Level Stairs-Rails Right;Left  Bathroom Environmental health practitioner None  Additional Comments "I have lots of help whenever I need it"  Prior Function  Level of Independence Independent  Comments recovering from recent hospitalization with COVID, but doing own dressing/bathing/ambulating without DME  Communication  Communication No difficulties  Pain Assessment  Pain Assessment Faces  Faces Pain Scale 4  Pain Location scrotum  Pain Descriptors / Indicators Heaviness  Pain Intervention(s) Limited activity within patient's tolerance;Monitored during session;Repositioned  Cognition  Arousal/Alertness Awake/alert  Behavior During Therapy WFL for tasks assessed/performed   Overall Cognitive Status Within Functional Limits for tasks assessed  Upper Extremity Assessment  Upper Extremity Assessment Defer to OT evaluation  Lower Extremity Assessment  Lower Extremity Assessment RLE deficits/detail;LLE deficits/detail  RLE Deficits / Details Unna boots on BLE   LLE Deficits / Details Unna boots on BLE   Cervical / Trunk Assessment  Cervical / Trunk Assessment Other exceptions  Cervical / Trunk Exceptions obese  Bed Mobility  General bed mobility comments oOB in recliner at beginning and end of session  Transfers  Overall transfer level Modified independent  Equipment used None  Ambulation/Gait  Ambulation/Gait assistance Supervision  Gait Distance (Feet) 100 Feet  Assistive device None  Gait Pattern/deviations Step-through pattern;Decreased stride length;Wide base of support  General Gait Details Slow, waddle type gait. Wide BOS. Pt with increased SOB, however, oxygen sats ranging from 90-93% on RA throughout. Educated about pursed lip breathing.   Gait velocity Decreased   Balance  Overall balance assessment No apparent balance deficits (not formally assessed)  General Comments  General comments (skin integrity, edema, etc.) Educated about energy conservation techniques.   PT - End of Session  Activity Tolerance Patient tolerated treatment well  Patient left in chair;with call bell/phone within reach  Nurse Communication Mobility status  PT Assessment  PT Recommendation/Assessment Patient needs continued PT services  PT Visit Diagnosis Difficulty in walking, not elsewhere classified (R26.2)  PT Problem List Decreased mobility;Decreased activity tolerance;Decreased knowledge of precautions;Cardiopulmonary status limiting activity  PT Plan  PT Frequency (ACUTE ONLY) Min 3X/week  PT Treatment/Interventions (ACUTE ONLY) Gait training;Functional mobility training;Stair training;Therapeutic exercise;Therapeutic activities;Patient/family education  AM-PAC PT  "6 Clicks" Mobility Outcome Measure (Version 2)  Help needed turning from your back to your side while in a flat bed without using bedrails? 4  Help needed moving from lying on your back to sitting on the side of a flat bed without using bedrails? 3  Help  needed moving to and from a bed to a chair (including a wheelchair)? 4  Help needed standing up from a chair using your arms (e.g., wheelchair or bedside chair)? 4  Help needed to walk in hospital room? 4  Help needed climbing 3-5 steps with a railing?  3  6 Click Score 22  Consider Recommendation of Discharge To: Home with no services  PT Recommendation  Follow Up Recommendations No PT follow up  PT equipment None recommended by PT  Individuals Consulted  Consulted and Agree with Results and Recommendations Patient  Acute Rehab PT Goals  Patient Stated Goal "get this swelling down"  PT Goal Formulation With patient  Time For Goal Achievement 05/03/19  Potential to Achieve Goals Good  PT Time Calculation  PT Start Time (ACUTE ONLY) 1532  PT Stop Time (ACUTE ONLY) 1558  PT Time Calculation (min) (ACUTE ONLY) 26 min  PT General Charges  $$ ACUTE PT VISIT 1 Visit  PT Evaluation  $PT Eval Low Complexity 1 Low  Written Expression  Dominant Hand Right   Pt admitted with problem above with deficits below. Pt requiring gross supervision for mobility. Pt with increased SOB, however, oxygen sats ranging from 90-93% on RA throughout. Educated about energy conservation. Pt plans to d/c home today. Do not feel he will require follow up PT services. Will continue to follow acutely to maximize functional mobility independence and safety.   Leighton Ruff, PT, DPT  Acute Rehabilitation Services  Pager: [phone] Office: [phone]

## 2019-04-19 NOTE — Progress Notes (Signed)
     Heart failure team will sign off as of 04/19/19  HF Team Medication Recommendations for Home:  Would continue unna boots via home health.  He will need close followup in CHF clinic.  Cardiac meds for home: amlodipine 10 mg daily, labetalol 200 mg bid, Xarelto 20 daily, torsemide 40 bid, atorvastatin 20 daily, KCl 20 daily.    Follow up as an outpatient: 04/24/2019 at 2:00    [name] NP-c  1:33 PM

## 2019-04-20 DIAGNOSIS — U071 COVID-19: Secondary | ICD-10-CM | POA: Diagnosis not present

## 2019-04-22 ENCOUNTER — Telehealth: Payer: Self-pay | Admitting: General Practice

## 2019-04-22 NOTE — Telephone Encounter (Signed)
Patient called wanting to know if he can get clarification on what medications he should be  Taking Please follow up.

## 2019-04-22 NOTE — Telephone Encounter (Signed)
It appears that he was most recently seen by Geryl Rankins, NP

## 2019-04-23 ENCOUNTER — Telehealth (HOSPITAL_COMMUNITY): Payer: Self-pay

## 2019-04-23 ENCOUNTER — Telehealth: Payer: Self-pay | Admitting: Nurse Practitioner

## 2019-04-23 NOTE — Telephone Encounter (Signed)
Tried to contact pt to verify if pt has insurance, phone number went straight to dial tone.

## 2019-04-23 NOTE — Telephone Encounter (Signed)
I called Pt, since we received an application for CAFA and OC but is not appt set up, LVM to call us back to schedule an appt. I will hold the documents until  04/30/19 if I don't see an appt by them I will return the documents

## 2019-04-24 ENCOUNTER — Encounter (HOSPITAL_COMMUNITY): Payer: Self-pay

## 2019-04-30 ENCOUNTER — Encounter (INDEPENDENT_AMBULATORY_CARE_PROVIDER_SITE_OTHER): Payer: Self-pay | Admitting: Primary Care

## 2019-04-30 ENCOUNTER — Other Ambulatory Visit: Payer: Self-pay

## 2019-04-30 ENCOUNTER — Ambulatory Visit (INDEPENDENT_AMBULATORY_CARE_PROVIDER_SITE_OTHER): Payer: Self-pay | Admitting: Primary Care

## 2019-04-30 VITALS — BP 165/96 | HR 83 | Temp 98.2°F | Ht 71.0 in | Wt >= 6400 oz

## 2019-04-30 DIAGNOSIS — Z6841 Body Mass Index (BMI) 40.0 and over, adult: Secondary | ICD-10-CM

## 2019-04-30 DIAGNOSIS — E1169 Type 2 diabetes mellitus with other specified complication: Secondary | ICD-10-CM

## 2019-04-30 DIAGNOSIS — E669 Obesity, unspecified: Secondary | ICD-10-CM | POA: Diagnosis not present

## 2019-04-30 DIAGNOSIS — I5032 Chronic diastolic (congestive) heart failure: Secondary | ICD-10-CM

## 2019-04-30 DIAGNOSIS — E66813 Obesity, class 3: Secondary | ICD-10-CM

## 2019-04-30 DIAGNOSIS — I509 Heart failure, unspecified: Secondary | ICD-10-CM

## 2019-04-30 DIAGNOSIS — Z23 Encounter for immunization: Secondary | ICD-10-CM

## 2019-04-30 LAB — POCT GLYCOSYLATED HEMOGLOBIN (HGB A1C): Hemoglobin A1C: 6.1 % — AB (ref 4.0–5.6)

## 2019-04-30 LAB — POCT CBG (FASTING - GLUCOSE)-MANUAL ENTRY: Glucose Fasting, POC: 85 mg/dL (ref 70–99)

## 2019-04-30 NOTE — Progress Notes (Signed)
 Established Patient Office Visit  Subjective:  Patient ID: Benjamin [name], male    DOB: 12/13/1965  Age: 53 y.o. MRN: [id number]  CC:  Chief Complaint  Patient presents with  . Hospitalization Follow-up    CHF     HPI [name] presents for a hospital follow up . Patient went to the emergency room for  worsening lower extremity edema and scrotal edema,  reports his legs feel very heavy, and uncomfortable, as well as scrotum and significantly swollen as well limiting his movement. 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  Past Medical History:  Diagnosis Date  . A-fib (HCC)   . Diabetes mellitus without complication (HCC)   . Elevated troponin 02/06/2019  . Hypertension   . Obesity   . Pulmonary embolism (HCC)   . Sleep apnea     Past Surgical History:  Procedure Laterality Date  . COLONOSCOPY WITH PROPOFOL Left 02/25/2019   Procedure: COLONOSCOPY WITH PROPOFOL;  Surgeon: Outlaw, William, MD;  Location: [facility];  Service: Endoscopy;  Laterality: Left;  . ESOPHAGOGASTRODUODENOSCOPY (EGD) WITH PROPOFOL Left 02/25/2019   Procedure: ESOPHAGOGASTRODUODENOSCOPY (EGD) WITH PROPOFOL;  Surgeon: Outlaw, William, MD;  Location: [facility] ENDOSCOPY;  Service: Endoscopy;  Laterality: Left;  . NO PAST SURGERIES    . RIGHT HEART CATH N/A 04/17/2019   Procedure: RIGHT HEART CATH;  Surgeon: McLean, Dalton S, MD;  Location: [facility] INVASIVE CV [facility];  Service: Cardiovascular;  Laterality: N/A;    Family History  Problem Relation Age of Onset  . Hypertension Mother   . Diabetes Mother     Social History   Socioeconomic History  . Marital status: Married    Spouse name: Not on file  . Number of children: Not on file  . Years of education: Not on file  . Highest education level: Not on file  Occupational History  . Not on file  Social Needs  . Financial resource strain: Not on file  . Food insecurity    Worry: Not on file    Inability: Not on file  . Transportation  needs    Medical: Not on file    Non-medical: Not on file  Tobacco Use  . Smoking status: Former Smoker  . Smokeless tobacco: Never Used  Substance and Sexual Activity  . Alcohol use: Not Currently  . Drug use: Not Currently  . Sexual activity: Not Currently  Lifestyle  . Physical activity    Days per week: Not on file    Minutes per session: Not on file  . Stress: Not on file  Relationships  . Social connections    Talks on phone: Not on file    Gets together: Not on file    Attends religious service: Not on file    Active member of club or organization: Not on file    Attends meetings of clubs or organizations: Not on file    Relationship status: Not on file  . Intimate partner violence    Fear of current or ex partner: Not on file    Emotionally abused: Not on file    Physically abused: Not on file    Forced sexual activity: Not on file  Other Topics Concern  . Not on file  Social History Narrative  . Not on file    Outpatient Medications Prior to Visit  Medication Sig Dispense Refill  . albuterol (VENTOLIN HFA) 108 (90 Base) MCG/ACT inhaler Inhale into the lungs every 6 (six) hours as   needed for wheezing or shortness of breath.    Marland Kitchen amLODipine (NORVASC) 10 MG tablet Take 1 tablet (10 mg total) by mouth daily. 60 tablet 0  . atorvastatin (LIPITOR) 20 MG tablet Take 1 tablet (20 mg total) by mouth at bedtime. 60 tablet 0  . Blood Glucose Monitoring Suppl (TRUE METRIX METER) w/Device KIT Use as instructed. Check blood glucose level by fingerstick twice per day. 1 kit 0  . carboxymethylcellulose (REFRESH PLUS) 0.5 % SOLN Place 1 drop into both eyes daily.     Marland Kitchen docusate sodium (COLACE) 100 MG capsule Take 1 capsule (100 mg total) by mouth 2 (two) times daily. 60 capsule 2  . ferrous sulfate 325 (65 FE) MG tablet Take 1 tablet (325 mg total) by mouth daily. Patient will pick up scripts today. 90 tablet 0  . glucose blood (TRUE METRIX BLOOD GLUCOSE TEST) test strip Use as  instructed. Check blood glucose level by fingerstick twice per day. 100 each 12  . labetalol (NORMODYNE) 200 MG tablet Take 1 tablet (200 mg total) by mouth 2 (two) times daily. 120 tablet 0  . levalbuterol (XOPENEX HFA) 45 MCG/ACT inhaler Inhale into the lungs every 4 (four) hours as needed for wheezing.    . metFORMIN (GLUCOPHAGE) 500 MG tablet Take 1 tablet (500 mg total) by mouth 2 (two) times a day. Patient will pick up scripts today. (Patient taking differently: Take 500 mg by mouth daily with breakfast. Patient will pick up scripts today.) 180 tablet 0  . potassium chloride (K-DUR) 10 MEQ tablet Take 2 tablets (20 mEq total) by mouth daily. 60 tablet 0  . rivaroxaban (XARELTO) 20 MG TABS tablet Take 1 tablet (20 mg total) by mouth daily with supper. 60 tablet 0  . torsemide (DEMADEX) 20 MG tablet Take 2 tablets (40 mg total) by mouth 2 (two) times daily. 240 tablet 0  . triamcinolone cream (KENALOG) 0.1 % Apply 1 application topically 3 (three) times daily as needed (rash/irritation).     . TRUEplus Lancets 28G MISC Use as instructed. Check blood glucose level by fingerstick twice per day. 200 each 3  . pantoprazole (PROTONIX) 40 MG tablet Take 1 tablet (40 mg total) by mouth daily. (Patient not taking: Reported on 04/30/2019) 60 tablet 0  . polyethylene glycol (MIRALAX / GLYCOLAX) 17 g packet Take 17 g by mouth daily as needed for severe constipation. (Patient not taking: Reported on 04/30/2019) 14 each 0   No facility-administered medications prior to visit.     No Known Allergies  ROS Review of Systems  Constitutional: Positive for activity change and fatigue.  Respiratory: Positive for chest tightness, shortness of breath and wheezing.   Gastrointestinal: Positive for abdominal distention.  Genitourinary: Positive for scrotal swelling.  Musculoskeletal: Positive for back pain.  Neurological: Positive for weakness.  Psychiatric/Behavioral: Positive for sleep disturbance.       Objective:    Physical Exam  Constitutional: He is oriented to person, place, and time. He appears well-developed and well-nourished.  HENT:  Head: Normocephalic and atraumatic.  Neck: Normal range of motion. Neck supple.  Cardiovascular: Normal rate and regular rhythm.  Pulmonary/Chest: He is in respiratory distress. He has wheezes.  Abdominal: Soft. Bowel sounds are normal.  Musculoskeletal:        General: Edema present.     Comments: Lower extremity edema -+3 bilateral   Neurological: He is oriented to person, place, and time.  Skin: Skin is warm and dry.  Psychiatric: He has a  normal mood and affect.    BP (!) 165/96 (BP Location: Right Arm, Patient Position: Sitting, Cuff Size: Large)   Pulse 83   Temp 98.2 F (36.8 C) (Tympanic)   Ht 5' 11" (1.803 m)   Wt (!) 413 lb 9.6 oz (187.6 kg)   SpO2 98%   BMI 57.69 kg/m  Wt Readings from Last 3 Encounters:  04/30/19 (!) 413 lb 9.6 oz (187.6 kg)  04/19/19 (!) 407 lb 14.4 oz (185 kg)  04/01/19 (!) 420 lb (190.5 kg)     Health Maintenance Due  Topic Date Due  . PNEUMOCOCCAL POLYSACCHARIDE VACCINE AGE 2-64 HIGH RISK  11/09/1967  . FOOT EXAM  11/09/1975  . OPHTHALMOLOGY EXAM  11/09/1975  . URINE MICROALBUMIN  11/09/1975  . TETANUS/TDAP  11/08/1984  . INFLUENZA VACCINE  04/20/2019    There are no preventive care reminders to display for this patient.  Lab Results  Component Value Date   HGBA1C 6.1 (A) 04/30/2019      Assessment & Plan:    Benjamin [name] was seen today for hospitalization follow-up.  Diagnoses and all orders for this visit:  Need for Tdap vaccination -     Tdap vaccine greater than or equal to 7yo IM  Diabetes mellitus type 2 in obese (HCC) ADA recommends the following therapeutic goals for glycemic control related to A1c measurements: Goal of therapy: Less than 6.5 hemoglobin A1c.  A1C today 6.1 last 3 months was 7.6. He admits to stop drinking soda and eating cakes.  Reference clinical practice  recommendations. Foods that are high in carbohydrates are the following rice, potatoes, breads, sugars, and pastas.  Reduction in the intake (eating) will assist in lowering your blood sugars. -     Microalbumin/Creatinine Ratio, Urine -     HgB A1c -     Glucose (CBG), Fasting  Obesity, Class III, BMI 40-49.9 (morbid obesity) (HCC) Morbid Obesity is 40- 49.9 indicating an excess in caloric intake or underlining conditions. This may lead to other co-morbidities. Lifestyle modifications of diet and exercise may reduce obesity.   Chronic diastolic CHF (congestive heart failure) (HCC)  Congested heart failure - recommended to call your cardiologist when   following symptoms: 1) 3 pound weight gain in 24 hours or 5 pounds in 1 week 2) shortness of breath, with or without a dry hacking cough 3) swelling in the hands, feet or stomach 4) if you have to sleep on extra pillows at night in order to breathe. Followed by Dr. Dalton Mclean.   23-polyvalent pneumococcal polysaccharide vaccine indication of congestive heart failure in patient 6 to 53 years of age (HCC)   No orders of the defined types were placed in this encounter.   Follow-up: Return in about 6 months (around 10/31/2019) for DM, .    Michelle P Edwards, NP 

## 2019-04-30 NOTE — Patient Instructions (Signed)
Pneumococcal Conjugate Vaccine (PCV13): What You Need to Know 1. Why get vaccinated? Pneumococcal conjugate vaccine (PCV13) can prevent pneumococcal disease. Pneumococcal disease refers to any illness caused by pneumococcal bacteria. These bacteria can cause many types of illnesses, including pneumonia, which is an infection of the lungs. Pneumococcal bacteria are one of the most common causes of pneumonia. Besides pneumonia, pneumococcal bacteria can also cause:  Ear infections  Sinus infections  Meningitis (infection of the tissue covering the brain and spinal cord)  Bacteremia (bloodstream infection) Anyone can get pneumococcal disease, but children under 2 years of age, people with certain medical conditions, adults 65 years or older, and cigarette smokers are at the highest risk. Most pneumococcal infections are mild. However, some can result in long-term problems, such as brain damage or hearing loss. Meningitis, bacteremia, and pneumonia caused by pneumococcal disease can be fatal. 2. PCV13 PCV13 protects against 13 types of bacteria that cause pneumococcal disease. Infants and young children usually need 4 doses of pneumococcal conjugate vaccine, at 2, 4, 6, and 12-15 months of age. In some cases, a child might need fewer than 4 doses to complete PCV13 vaccination. A dose of PCV23 vaccine is also recommended for anyone 2 years or older with certain medical conditions if they did not already receive PCV13. This vaccine may be given to adults 65 years or older based on discussions between the patient and health care provider. 3. Talk with your health care provider Tell your vaccine provider if the person getting the vaccine:  Has had an allergic reaction after a previous dose of PCV13, to an earlier pneumococcal conjugate vaccine known as PCV7, or to any vaccine containing diphtheria toxoid (for example, DTaP), or has any severe, life-threatening allergies.  In some cases, your health  care provider may decide to postpone PCV13 vaccination to a future visit. People with minor illnesses, such as a cold, may be vaccinated. People who are moderately or severely ill should usually wait until they recover before getting PCV13. Your health care provider can give you more information. 4. Risks of a vaccine reaction  Redness, swelling, pain, or tenderness where the shot is given, and fever, loss of appetite, fussiness (irritability), feeling tired, headache, and chills can happen after PCV13. Young children may be at increased risk for seizures caused by fever after PCV13 if it is administered at the same time as inactivated influenza vaccine. Ask your health care provider for more information. People sometimes faint after medical procedures, including vaccination. Tell your provider if you feel dizzy or have vision changes or ringing in the ears. As with any medicine, there is a very remote chance of a vaccine causing a severe allergic reaction, other serious injury, or death. 5. What if there is a serious problem? An allergic reaction could occur after the vaccinated person leaves the clinic. If you see signs of a severe allergic reaction (hives, swelling of the face and throat, difficulty breathing, a fast heartbeat, dizziness, or weakness), call 9-1-1 and get the person to the nearest hospital. For other signs that concern you, call your health care provider. Adverse reactions should be reported to the Vaccine Adverse Event Reporting System (VAERS). Your health care provider will usually file this report, or you can do it yourself. Visit the VAERS website at www.vaers.hhs.gov or call 1-800-822-7967. VAERS is only for reporting reactions, and VAERS staff do not give medical advice. 6. The National Vaccine Injury Compensation Program The National Vaccine Injury Compensation Program (VICP) is a federal program   that was created to compensate people who may have been injured by certain  vaccines. Visit the VICP website at www.hrsa.gov/vaccinecompensation or call 1-800-338-2382 to learn about the program and about filing a claim. There is a time limit to file a claim for compensation. 7. How can I learn more?  Ask your health care provider.  Call your local or state health department.  Contact the Centers for Disease Control and Prevention (CDC): ? Call 1-800-232-4636 (1-800-CDC-INFO) or ? Visit CDC's website at www.cdc.gov/vaccines Vaccine Information Statement PCV13 Vaccine (07/18/2018) This information is not intended to replace advice given to you by your health care provider. Make sure you discuss any questions you have with your health care provider. Document Released: 07/03/2006 Document Revised: 12/25/2018 Document Reviewed: 04/17/2018 Elsevier Patient Education  2020 [employer].  

## 2019-04-30 NOTE — Telephone Encounter (Signed)
Since Pt did not contact us to schedule an appt am sending back the documents he submit and informed him what he need to bring and do for appt

## 2019-05-01 LAB — MICROALBUMIN / CREATININE URINE RATIO
Creatinine, Urine: 309.5 mg/dL
Microalb/Creat Ratio: 7 mg/g creat (ref 0–29)
Microalbumin, Urine: 22.2 ug/mL

## 2019-05-01 NOTE — Telephone Encounter (Signed)
Patient had an Ov with Juluis Mire at [facility].

## 2019-05-03 ENCOUNTER — Telehealth (INDEPENDENT_AMBULATORY_CARE_PROVIDER_SITE_OTHER): Payer: Self-pay

## 2019-05-03 NOTE — Telephone Encounter (Signed)
Patient verified date of birth and was informed that labs were normal. Benjamin [name], Benjamin [name]

## 2019-05-03 NOTE — Telephone Encounter (Signed)
-----   Message from Kerin Perna, NP sent at 05/03/2019 11:35 AM EDT ----- I have reviewed all labs and they are normal/unremarkable. Please notify patient of the results. Have them schedule appointment if there are any other concerns or questions. Thank you.

## 2019-05-07 ENCOUNTER — Encounter (HOSPITAL_COMMUNITY): Payer: Self-pay

## 2019-05-07 ENCOUNTER — Ambulatory Visit (HOSPITAL_COMMUNITY)
Admission: RE | Admit: 2019-05-07 | Discharge: 2019-05-07 | Disposition: A | Discharge status: Home / Self Care | Payer: Medicaid Other | Source: Ambulatory Visit | Attending: Internal Medicine | Admitting: Internal Medicine

## 2019-05-07 ENCOUNTER — Other Ambulatory Visit: Payer: Self-pay

## 2019-05-07 ENCOUNTER — Telehealth (HOSPITAL_COMMUNITY): Payer: Self-pay | Admitting: Licensed Clinical Social Worker

## 2019-05-07 VITALS — BP 180/101 | HR 85 | Wt >= 6400 oz

## 2019-05-07 DIAGNOSIS — E119 Type 2 diabetes mellitus without complications: Secondary | ICD-10-CM | POA: Insufficient documentation

## 2019-05-07 DIAGNOSIS — G4733 Obstructive sleep apnea (adult) (pediatric): Secondary | ICD-10-CM | POA: Diagnosis not present

## 2019-05-07 DIAGNOSIS — Z8249 Family history of ischemic heart disease and other diseases of the circulatory system: Secondary | ICD-10-CM | POA: Insufficient documentation

## 2019-05-07 DIAGNOSIS — Z87891 Personal history of nicotine dependence: Secondary | ICD-10-CM | POA: Insufficient documentation

## 2019-05-07 DIAGNOSIS — Z7901 Long term (current) use of anticoagulants: Secondary | ICD-10-CM | POA: Diagnosis not present

## 2019-05-07 DIAGNOSIS — Z79899 Other long term (current) drug therapy: Secondary | ICD-10-CM | POA: Diagnosis not present

## 2019-05-07 DIAGNOSIS — N179 Acute kidney failure, unspecified: Secondary | ICD-10-CM | POA: Insufficient documentation

## 2019-05-07 DIAGNOSIS — I110 Hypertensive heart disease with heart failure: Secondary | ICD-10-CM | POA: Diagnosis not present

## 2019-05-07 DIAGNOSIS — E669 Obesity, unspecified: Secondary | ICD-10-CM | POA: Insufficient documentation

## 2019-05-07 DIAGNOSIS — I10 Essential (primary) hypertension: Secondary | ICD-10-CM

## 2019-05-07 DIAGNOSIS — Z833 Family history of diabetes mellitus: Secondary | ICD-10-CM | POA: Insufficient documentation

## 2019-05-07 DIAGNOSIS — Z6841 Body Mass Index (BMI) 40.0 and over, adult: Secondary | ICD-10-CM | POA: Diagnosis not present

## 2019-05-07 DIAGNOSIS — I480 Paroxysmal atrial fibrillation: Secondary | ICD-10-CM | POA: Diagnosis not present

## 2019-05-07 DIAGNOSIS — K219 Gastro-esophageal reflux disease without esophagitis: Secondary | ICD-10-CM | POA: Insufficient documentation

## 2019-05-07 DIAGNOSIS — Z7984 Long term (current) use of oral hypoglycemic drugs: Secondary | ICD-10-CM | POA: Diagnosis not present

## 2019-05-07 DIAGNOSIS — Z86711 Personal history of pulmonary embolism: Secondary | ICD-10-CM | POA: Insufficient documentation

## 2019-05-07 DIAGNOSIS — I5032 Chronic diastolic (congestive) heart failure: Secondary | ICD-10-CM | POA: Insufficient documentation

## 2019-05-07 DIAGNOSIS — I2699 Other pulmonary embolism without acute cor pulmonale: Secondary | ICD-10-CM

## 2019-05-07 LAB — CBC
HCT: 37.4 % — ABNORMAL LOW (ref 39.0–52.0)
Hemoglobin: 10.9 g/dL — ABNORMAL LOW (ref 13.0–17.0)
MCH: 23.4 pg — ABNORMAL LOW (ref 26.0–34.0)
MCHC: 29.1 g/dL — ABNORMAL LOW (ref 30.0–36.0)
MCV: 80.4 fL (ref 80.0–100.0)
Platelets: 373 10*3/uL (ref 150–400)
RBC: 4.65 MIL/uL (ref 4.22–5.81)
RDW: 15.8 % — ABNORMAL HIGH (ref 11.5–15.5)
WBC: 6.9 10*3/uL (ref 4.0–10.5)
nRBC: 0 % (ref 0.0–0.2)

## 2019-05-07 LAB — BASIC METABOLIC PANEL
Anion gap: 9 (ref 5–15)
BUN: 12 mg/dL (ref 6–20)
CO2: 28 mmol/L (ref 22–32)
Calcium: 9.1 mg/dL (ref 8.9–10.3)
Chloride: 101 mmol/L (ref 98–111)
Creatinine, Ser: 1.12 mg/dL (ref 0.61–1.24)
GFR calc Af Amer: >60 mL/min (ref >60)
GFR calc non Af Amer: >60 mL/min (ref >60)
Glucose, Bld: 91 mg/dL (ref 70–99)
Potassium: 3.7 mmol/L (ref 3.5–5.1)
Sodium: 138 mmol/L (ref 135–145)

## 2019-05-07 LAB — BRAIN NATRIURETIC PEPTIDE: B Natriuretic Peptide: 17.1 pg/mL (ref 0.0–100.0)

## 2019-05-07 NOTE — Patient Instructions (Addendum)
You have been referred to [facility]. We will reach out to you to set up an appointment with you.  Labs were done today. We will call you with any ABNORMAL results. No news is good news!  Your physician recommends that you schedule a follow-up appointment in: 3 weeks with Amy.  At the [facility], you and your health needs are our priority. As part of our continuing mission to provide you with exceptional heart care, we have created designated Provider Care Teams. These Care Teams include your primary Cardiologist (physician) and Advanced Practice Providers (APPs- Physician Assistants and Nurse Practitioners) who all work together to provide you with the care you need, when you need it.   You may see any of the following providers on your designated Care Team at your next follow up: Marland Kitchen Dr Glori Bickers . Dr Loralie Champagne . Darrick Grinder, NP   Please be sure to bring in all your medications bottles to every appointment.

## 2019-05-07 NOTE — Progress Notes (Signed)
PCP: Juluis Mire NP Primary HF Cardiologist: Dr Aundra Dubin   HPI: Mr. Lannen is a 53 yo with hx of diastolic CHF, HTN, DM, Atrial fib, PE in April 2020 (on Xarelto), OSA, GERD,chronic elevation of troponin.    Admitted 01/21/2019 with increased dyspnea and had PE. He was placed on back on coumadin .  Patient re-admitted 5/20 for syncope/cough/dyspnea, COVID+, CT positive for bilateral GGOs, admitted to [facility] and discharged after 6 days, on O2. Rec'd Cards f/u.  Patient re-admitted 02/20/19 for bright red blood per rectum, eval'ed by GI, had EGD/colon, dx'd with hemorrhoidal bleeding, discharged after 9 days on O2. He was again Covid + .  Readmitted 03/11/19 with increased shortness of breath. He did not have home oxygen. He was discharged to home the next day.   He presented to [facility] ED 04/11/19 with lower extremity edema . COVID negative. Diuresed with lasix drip and transitioned to torsemide 40 mg twice daily. Had RHC as noted below with mild volume overload and preserved cardiac output. Discharge weight was 407.9 pounds.   Today he returns for post hospital follow up. Overall feeling ok. Says he ran out of oxygen at home. He does not have CPAP yet but has been trying to get it approved. SOB with exertion. Denies PND/Orthopnea. Appetite ok. No fever or chills. Has not been weighing at home because he doesn't have a scale. He has all medications but did not take them today.  Applied for Medicaid. Living at half way house. He cant pay for medications.   Echo 01/2019:EF 60-65%, grade I DD  RHC 04/17/2019  RA mean 12 RV 37/18 PA 40/25, mean 31 PCWP mean 23 Oxygen saturations: PA 64% AO 100% Cardiac Output (Fick) 7.07  Cardiac Index (Fick) 2.47 PVR 1.1 WU  ROS: All systems negative except as listed in HPI, PMH and Problem List.  SH:  Social History   Socioeconomic History  . Marital status: Married    Spouse name: Not on file  . Number of children: Not on file  . Years of education:  Not on file  . Highest education level: Not on file  Occupational History  . Not on file  Social Needs  . Financial resource strain: Not on file  . Food insecurity    Worry: Not on file    Inability: Not on file  . Transportation needs    Medical: Not on file    Non-medical: Not on file  Tobacco Use  . Smoking status: Former Research scientist (life sciences)  . Smokeless tobacco: Never Used  Substance and Sexual Activity  . Alcohol use: Not Currently  . Drug use: Not Currently  . Sexual activity: Not Currently  Lifestyle  . Physical activity    Days per week: Not on file    Minutes per session: Not on file  . Stress: Not on file  Relationships  . Social Herbalist on phone: Not on file    Gets together: Not on file    Attends religious service: Not on file    Active member of club or organization: Not on file    Attends meetings of clubs or organizations: Not on file    Relationship status: Not on file  . Intimate partner violence    Fear of current or ex partner: Not on file    Emotionally abused: Not on file    Physically abused: Not on file    Forced sexual activity: Not on file  Other Topics Concern  .  Not on file  Social History Narrative  . Not on file    FH:  Family History  Problem Relation Age of Onset  . Hypertension Mother   . Diabetes Mother     Past Medical History:  Diagnosis Date  . A-fib ([place])   . Diabetes mellitus without complication ([place])   . Elevated troponin 02/06/2019  . Hypertension   . Obesity   . Pulmonary embolism ([place])   . Sleep apnea     Current Outpatient Medications  Medication Sig Dispense Refill  . albuterol (VENTOLIN HFA) 108 (90 Base) MCG/ACT inhaler Inhale into the lungs every 6 (six) hours as needed for wheezing or shortness of breath.    Marland Kitchen amLODipine (NORVASC) 10 MG tablet Take 1 tablet (10 mg total) by mouth daily. 60 tablet 0  . atorvastatin (LIPITOR) 20 MG tablet Take 1 tablet (20 mg total) by mouth at bedtime. 60 tablet 0  .  Blood Glucose Monitoring Suppl (TRUE METRIX METER) w/Device KIT Use as instructed. Check blood glucose level by fingerstick twice per day. 1 kit 0  . carboxymethylcellulose (REFRESH PLUS) 0.5 % SOLN Place 1 drop into both eyes daily.     Marland Kitchen docusate sodium (COLACE) 100 MG capsule Take 1 capsule (100 mg total) by mouth 2 (two) times daily. 60 capsule 2  . ferrous sulfate 325 (65 FE) MG tablet Take 1 tablet (325 mg total) by mouth daily. Patient will pick up scripts today. 90 tablet 0  . glucose blood (TRUE METRIX BLOOD GLUCOSE TEST) test strip Use as instructed. Check blood glucose level by fingerstick twice per day. 100 each 12  . labetalol (NORMODYNE) 200 MG tablet Take 1 tablet (200 mg total) by mouth 2 (two) times daily. 120 tablet 0  . levalbuterol (XOPENEX HFA) 45 MCG/ACT inhaler Inhale into the lungs every 4 (four) hours as needed for wheezing.    . metFORMIN (GLUCOPHAGE) 500 MG tablet Take 1 tablet (500 mg total) by mouth 2 (two) times a day. Patient will pick up scripts today. (Patient taking differently: Take 500 mg by mouth daily with breakfast. Patient will pick up scripts today.) 180 tablet 0  . pantoprazole (PROTONIX) 40 MG tablet Take 1 tablet (40 mg total) by mouth daily. 60 tablet 0  . polyethylene glycol (MIRALAX / GLYCOLAX) 17 g packet Take 17 g by mouth daily as needed for severe constipation. 14 each 0  . potassium chloride (K-DUR) 10 MEQ tablet Take 2 tablets (20 mEq total) by mouth daily. 60 tablet 0  . rivaroxaban (XARELTO) 20 MG TABS tablet Take 1 tablet (20 mg total) by mouth daily with supper. 60 tablet 0  . torsemide (DEMADEX) 20 MG tablet Take 2 tablets (40 mg total) by mouth 2 (two) times daily. 240 tablet 0  . triamcinolone cream (KENALOG) 0.1 % Apply 1 application topically 3 (three) times daily as needed (rash/irritation).     . TRUEplus Lancets 28G MISC Use as instructed. Check blood glucose level by fingerstick twice per day. 200 each 3   No current  facility-administered medications for this encounter.     Vitals:   05/07/19 1102  BP: (!) 180/101  Pulse: 85  SpO2: 93%  Weight: (!) 186.7 kg (411 lb 9.6 oz)    PHYSICAL EXAM: General:   No resp difficulty HEENT: normal Neck: supple. Hard to assess due to body habitus.  Carotids 2+ bilaterally; no bruits. No lymphadenopathy or thryomegaly appreciated. Cor: PMI normal. Regular rate & rhythm. No rubs, gallops  or murmurs. Lungs: Course throughout Abdomen: obese, soft,  nontender, nondistended. No hepatosplenomegaly. No bruits or masses. Good bowel sounds. Extremities: no cyanosis, clubbing, rash, R and LLE trace edema.  Neuro: alert & orientedx3, cranial nerves grossly intact. Moves all 4 extremities w/o difficulty. Affect pleasant.  ASSESSMENT & PLAN: 1. Chronic diastolic CHF: Echo in 7/62 showed EF 60-65% but RV was poorly visualized. He was initially diuresed with Lasix gtt 10 mg/hr and weight dropped 10 lbs. However, creatinine increased from 1.24 =>2.38.  I suspect significant RV dysfunction in setting of OSA, unfortunately unable to see the RV well on last echo. [place] on 7/29 showed elevated left and right heart filling pressures but not marked elevation.   -NYHA III. Volume status stable. Continue torsemide 40 mg bid.  Check BMET today.  Discussed limiting fluids to < 2 liters per day and low salt food choices.  2. AKI: Check BMET today.  3. Atrial fibrillation: Paroxysmal.   - Regular on exam.  - Continue Xarelto.   4. OSA:We called DME company so we can get set up. He never had CPAP set up discharge.  5. Obesity: Body mass index is 57.41 kg/m.  Discussed portion control.  6. H/o PE: 5/20.  Continue Xarelto.   7. HTN: Elevated but he hasnt not had medications today.   Referred to HF Paramedicine today. He has had 4 admits over the last few months and is at high risk for readmit. Need to get a better handle on his blood pressure. Referred to [facility] for assistance of medicaid  application.   Follow up in 3 weeks.   Amy Clegg NP-C  2:49 PM

## 2019-05-07 NOTE — Telephone Encounter (Signed)
Paramedicine Initial Assessment:  Housing:  In what kind of housing do you live? House/apt/trailer/shelter? Patient currently in a halfway house since his release from prison in March (was in prison for 9 years). Called [employer].  Do you rent/pay a mortgage/own? No- allowed to stay for free  Do you live with anyone? Lives with about 20 other halfway house residents  Are you currently worried about losing your housing? No- is scheduled to stay at halfway house through the end of the year- interested in getting an some kind of assisted living at that time pending medicaid approval.  Within the past 12 months have you ever stayed outside, in a car, tent, a shelter, or temporarily with someone? no  Within the past 12 months have you been unable to get utilities when it was really needed? no  Social:  What is your current marital status? single  Do you have any children? Has one son who lives in [place]- states they talk but their relationship is strained because of patients behavior in the past.  Do you have family or friends who live locally? Has a mom who also lives in [place] who he is very close with and talks to everyday.  Food:  Within the past 12 months were you ever worried that food would run out before you got money to buy more? No- is being provided food by his halfway house.  Within the past 17months have you run out of food and didn't have money to buy more? no  Income:  What is your current source of income? No income at this time.  Got a job right after being released but immediately got injured then got [place] leading to health complications so has been unable to work since then- awaiting disability determination.  How hard is it for you to pay for the basics like food housing, medical care, and utilities? Not hard currently as he has no expenses.  Do you have outstanding medical bills? Yes but medicaid pending.  Insurance:  Are you currently insured? No-  medicaid pending  Do you have prescription coverage? No- gets meds through [facility] (blue card?)  If no insurance, have you applied for coverage (Medicaid, disability, marketplace etc)? Yes- pending.  Transportation:  Do you have transportation to your medical appointments? Yes   If yes, how? Transport through [employer].  In the past 12 months has lack of transportation kept you from medical appts or from getting medications? no  In the past 12 months has lack of transportation kept you from meetings, work, or getting things you needed? no   Daily Health Needs: Do you have a working scale at home? Was given one in clinic today.  How do you manage your medications at home? Organizes them himself.  Do you ever take your medications differently than prescribed? no  Do you have issues affording your medications? No- gets them through [place].  If yes, has this ever prevented you from obtaining medications? no  Do you have any concerns with mobility at home? Is very weak and gets SOB easily- states he fell yesterday due to dizziness/weakness.   Do you use any assistive devices at home or have PCS at home? No assistive devices at home but would like a walker if possible.   Are there any additional barriers you see to getting the care you need? Not at this time.  CSW will continue to follow through paramedicine program and assist as needed.   [name],  LCSW Clinical Social Worker [facility] Desk#: [phone] Cell#: [phone]

## 2019-05-08 ENCOUNTER — Telehealth (HOSPITAL_COMMUNITY): Payer: Self-pay

## 2019-05-08 ENCOUNTER — Telehealth (HOSPITAL_COMMUNITY): Payer: Self-pay | Admitting: Licensed Clinical Social Worker

## 2019-05-08 NOTE — Telephone Encounter (Signed)
Called pt in regards to referral to community paramedicine program, pt number listed in chart goes directly to a list of what im assuming is staff members of the half way house he is staying at.  Not sure who I need to speak with. Will f/u, may have to do a drop in visit.   [name], EMT-Paramedic  05/08/19

## 2019-05-08 NOTE — Telephone Encounter (Signed)
I called the [employer] back and the first extension option that was voiced I put in that number and someone picked up the phone and was able to give pt the phone. Pt has pulm appoint tomor, so I will f/u with him on Monday for the initial visit.   [name], EMT-Paramedic  05/08/19

## 2019-05-08 NOTE — Telephone Encounter (Signed)
CSW following pt to assist with continuing needs.  CSW consulted to help follow up on patient Medicaid/Disabilty cases.  CSW called [facility] and informed that pt case worker is [place]- phone: [phone] ex [phone], Fax: [phone]  [facility] spoke with caseworker and faxed clinic notes to assist with continued review.  CSW assistanced also requested in figuring out current CPAP situation.  Pt was set up with CPAP through inpatient [facility] team prior to discharging from hospital but now pt being told there are payment barriers to continuing with CPAP set up.  CSW spoke with Suanne Marker of [employer] who is providing pt CPAP and organizing fitting- they need to finalize payment details with hospital prior to moving forward then will assist patient with fitting and providing equipment- CSW left message for Nathaniel Man of inpatient [facility] team to discuss as he is who has been working with [employer] on this case.  CSW also spoke with Zack of [employer] to discuss their concerns- they would need continued payment from the hospital to provide home services for pt or would need medicaid pending number for pt- CSW left message for DSS worker to get medicaid pending number.  CSW will continue to follow and assist as needed  [name], [facility] Desk#: [phone] Cell#: [phone]

## 2019-05-09 ENCOUNTER — Other Ambulatory Visit: Payer: Self-pay

## 2019-05-09 ENCOUNTER — Encounter: Payer: Self-pay | Admitting: Pulmonary Disease

## 2019-05-09 ENCOUNTER — Telehealth: Payer: Self-pay | Admitting: Pulmonary Disease

## 2019-05-09 ENCOUNTER — Telehealth (HOSPITAL_COMMUNITY): Payer: Self-pay | Admitting: *Deleted

## 2019-05-09 ENCOUNTER — Ambulatory Visit (INDEPENDENT_AMBULATORY_CARE_PROVIDER_SITE_OTHER): Payer: Self-pay | Admitting: Pulmonary Disease

## 2019-05-09 VITALS — BP 142/88 | HR 94 | Temp 97.3°F | Ht 71.0 in | Wt >= 6400 oz

## 2019-05-09 DIAGNOSIS — E662 Morbid (severe) obesity with alveolar hypoventilation: Secondary | ICD-10-CM

## 2019-05-09 DIAGNOSIS — G4730 Sleep apnea, unspecified: Secondary | ICD-10-CM

## 2019-05-09 NOTE — Patient Instructions (Signed)
Will arrange for in lab sleep study Will call to arrange for follow up after sleep study reviewed  

## 2019-05-09 NOTE — Progress Notes (Signed)
[facility]  Chief Complaint  Patient presents with  . Consult    Consult for Sleep Apnea. He reports he wore a Cpap years ago. He reports snoring, waking up with headaches, and fatigue.    Constitutional:  BP (!) 142/88   Pulse 94   Temp (!) 97.3 F (36.3 C) (Temporal)   Ht _0  (1.803 m)   Wt (!) 407 lb 12.8 oz (185 kg)   SpO2 97%   BMI 56.88 kg/m   Past Medical History:  PE, HTN, DM, A fib, PNA May 2020  Brief Summary:  Benjamin [name] is a 53 y.o. male with obstructive sleep apnea.  He was in hospital recently.  Advised to have sleep follow up.  He had sleep study years ago and had CPAP.  Was in prison and hasn't used CPAP since.  He goes to sleep at 9 pm.  He falls asleep immediately.  He wakes up 7 or 8 times to use the bathroom.  He gets out of bed at 4 am.  He feels tire in the morning.  He gets morning headache.  He does not use anything to help him fall sleep or stay awake.  He denies sleep walking, sleep talking, bruxism, or nightmares.  There is no history of restless legs.  He denies sleep hallucinations, sleep paralysis, or cataplexy.  The Epworth score is 23 out of 24.  Physical Exam:   Appearance - fell asleep prior to my entering room  ENMT - clear nasal mucosa, midline nasal  septum, no oral exudates, no LAN, trachea midline, MP 4, enlarged tongue  Respiratory - normal chest wall, normal respiratory effort, no accessory muscle use, no wheeze/rales  CV - s1s2 regular rate and rhythm, no murmurs, 1+ peripheral edema, radial pulses symmetric  GI - soft, non tender, no masses  Lymph - no adenopathy noted in neck and axillary areas  MSK - normal gait  Ext - no cyanosis, clubbing, or joint inflammation noted  Skin - no rashes, lesions, or ulcers  Neuro - normal strength, oriented x 3  Psych - normal mood and affect   Assessment/Plan:   Obstructive sleep apnea. - will arrange for in lab sleep study   Obesity hypoventilation syndrome. - will determine whether he needs supplemental oxygen based on sleep study review  Morbid obesity. - discussed importance of weight loss  Driving precautions. - reviewed with patient  Lack of insurance. - he is applying for disability and medicaid   Patient Instructions  Will arrange for in lab sleep study Will call to arrange for follow up after sleep study reviewed     [name], MD [place] Pager: [phone] 05/09/2019, 12:36 PM  Flow Sheet    Pulmonary tests:  ABG RA 04/19/19 >> pH 7.44, PCO2 49.5, PO2 59.9  Chest imaging:  CT angio chest 01/21/19 >> b/l lower lobe PE CT angio chest 02/26/19 >> no PE  Sleep tests:    Cardiac tests:  Echo 01/22/19 >> EF 60 to 65%, mild LVH RHC 04/17/19 >> pulmonary venous hypertension  Review of Systems:  Reviewed and negative  Medications:   Allergies as of 05/09/2019   No Known Allergies     Medication List       Accurate as of May 09, 2019 12:36 PM. If you have any questions, ask your nurse or doctor.        albuterol 108 (90 Base) MCG/ACT inhaler Commonly known as: VENTOLIN  HFA Inhale into the lungs every 6 (six) hours as needed for wheezing or shortness of breath.   amLODipine 10 MG tablet Commonly known as: NORVASC Take 1 tablet (10 mg total) by mouth daily.   atorvastatin 20 MG tablet Commonly known as: LIPITOR Take 1 tablet (20 mg total) by mouth at bedtime.   carboxymethylcellulose 0.5 % Soln Commonly known as: REFRESH PLUS Place 1 drop into both eyes daily.   docusate sodium 100 MG capsule Commonly known as: COLACE Take 1 capsule (100 mg total) by mouth 2 (two) times daily.   ferrous sulfate 325 (65 FE) MG tablet Take 1 tablet (325 mg total) by mouth daily. Patient will pick up scripts today.   labetalol 200 MG tablet Commonly known as: NORMODYNE Take 1 tablet (200 mg total) by mouth 2 (two) times daily.   levalbuterol 45  MCG/ACT inhaler Commonly known as: XOPENEX HFA Inhale into the lungs every 4 (four) hours as needed for wheezing.   metFORMIN 500 MG tablet Commonly known as: GLUCOPHAGE Take 1 tablet (500 mg total) by mouth 2 (two) times a day. Patient will pick up scripts today. What changed: when to take this   pantoprazole 40 MG tablet Commonly known as: PROTONIX Take 1 tablet (40 mg total) by mouth daily.   polyethylene glycol 17 g packet Commonly known as: MIRALAX / GLYCOLAX Take 17 g by mouth daily as needed for severe constipation.   potassium chloride 10 MEQ tablet Commonly known as: K-DUR Take 2 tablets (20 mEq total) by mouth daily.   rivaroxaban 20 MG Tabs tablet Commonly known as: XARELTO Take 1 tablet (20 mg total) by mouth daily with supper.   torsemide 20 MG tablet Commonly known as: DEMADEX Take 2 tablets (40 mg total) by mouth 2 (two) times daily.   triamcinolone cream 0.1 % Commonly known as: KENALOG Apply 1 application topically 3 (three) times daily as needed (rash/irritation).   True Metrix Blood Glucose Test test strip Generic drug: glucose blood Use as instructed. Check blood glucose level by fingerstick twice per day.   True Metrix Meter w/Device Kit Use as instructed. Check blood glucose level by fingerstick twice per day.   TRUEplus Lancets 28G Misc Use as instructed. Check blood glucose level by fingerstick twice per day.       Past Surgical History:  He  has a past surgical history that includes No past surgeries; Esophagogastroduodenoscopy (egd) with propofol (Left, 02/25/2019); Colonoscopy with propofol (Left, 02/25/2019); and RIGHT HEART CATH (N/A, 04/17/2019).  Family History:  His family history includes Diabetes in his mother; Hypertension in his mother.  Social History:  He  reports that he has quit smoking. He has never used smokeless tobacco. He reports previous alcohol use. He reports previous drug use.

## 2019-05-09 NOTE — Telephone Encounter (Signed)
Attempted to contact [name] x 2, line rings and rings and then rings busy. Will hold in triage and try back later.

## 2019-05-09 NOTE — Telephone Encounter (Signed)
Attempted to contact [name] x3, phone line rings then rings busy.   LMTCB with patient.

## 2019-05-09 NOTE — Telephone Encounter (Signed)
Contacted patient per referral from Social Work regarding patient's concerns surrounding his physical activity and weight loss. Expressed the nature of the call and explained the referral and agreed with the plan to discuss exercise recommendations and weekly follow-up with a coaching style to help patient to achieve goals and improve quality of life. All with consideration to his clinical limitations and considerations.   Patient stated he was ready for making a change and with guided interviewing of patient from CEP, patient self-identified the following with readiness for change.   Current activity/limitatins/habits: 15-20 stepsupstairs 5x daily and is unable to make it to the top without stopping to rest. Patient stated further limitations to stairs due to his knee discomfort. States his SOB has worsened since COVID in March. He likes to wash dishes to have some activity for the day,but states he is unable to stand for more than 10 minutes at a time because he gets "tired". He currently weighs daily as he stated he notices in his dyspnea when his weight is up. Patient is in a current transition of substituting his "multiple cheeseburgers and frostys a day", with salads and bananas. He seems to his surprise, "content" and "satisfied" with this current diet regimen.   Goals: to improve his quality of life post-COVID and previous life events. He identified that he would like to make an action plan to improve and increase physical activity, improve diet, and self-efficacy with independence.   Action plan: "GET UP AND MOVE MORE". Patient set a goal for the next week to walk 3-5 minutes in one bout, 5x a day. Patient wants to continue substituting his multiple cheeseburgers and frostys a day, with salads and bananas. Clinical Exercise Physiologist will follow-up with patient weekly (per his request) to help as patient accountability partner and to modify and follow exercise regimen. Patient suggested he keep a  journal and log of the following: daily weights, diet, exercise and how he feels overall. He suggested he report these goals journaled in weekly follow-up, 30-minutes sessions.   CEP and patient in agreement with the above stated plan. CEP will follow weekly until patient and CEP decide otherwise.     Benjamin Martins, MS, ACSM-RCEP Clinical Exercise Physiologist

## 2019-05-10 ENCOUNTER — Telehealth: Payer: Self-pay | Admitting: Pulmonary Disease

## 2019-05-10 ENCOUNTER — Telehealth (HOSPITAL_COMMUNITY): Payer: Self-pay | Admitting: Licensed Clinical Social Worker

## 2019-05-10 NOTE — Telephone Encounter (Signed)
We have attempted to contact [name] several times with no success or call back from [place] or the pt. Per triage protocol, message will be closed.

## 2019-05-10 NOTE — Telephone Encounter (Signed)
Called and spoke with [name] patient's Education officer, museum. She is wondering if patient needs to be on CPAP prior to his sleep study which is scheduled for 05/17/19 split night.   [name] is willing to give patient one but really wants the results of the sleep study first. Unless Dr. Halford Chessman recommends patient needs machine now.   I explained that Dr. Halford Chessman would not be in office until Monday 05/13/19, but I would send a message over to him. [name] voiced understanding.   Dr. Halford Chessman, please advise.

## 2019-05-10 NOTE — Telephone Encounter (Signed)
CSW continuing to work on getting pt CPAP.    CSW spoke with Nathaniel Man who is Surveyor, quantity of inpatient TOC regarding pt CPAP- they are trying to decide if CPAP needs to be bought or rented depending on pt need and would like for there to be a completed sleep study prior to making this decision.  Sleep study planned for next Friday.  CSW spoke with [facility] who saw pt yesterday to confirm it pt would be safe to continue without CPAP until after those results are in from sleep study- MD will be back on Monday and hopeful to get answer a that time.  CSW will continue to follow and assist as needed  [name], [facility] Desk#: [phone] Cell#: [phone]

## 2019-05-11 NOTE — Telephone Encounter (Signed)
He 100% needs CPAP.  There is no doubt in my mind that he has sleep apnea.  If it is possible to get him a CPAP before formal diagnosis with sleep study, that would be great.  If so, then please see if he can get an auto CPAP range 10 to 20 cm H2O.  If he can't get an auto CPAP, then please get CPAP with pressure 14 cm H2O.  Further adjustments can then be made after sleep study reviewed.

## 2019-05-12 ENCOUNTER — Other Ambulatory Visit: Payer: Self-pay

## 2019-05-12 ENCOUNTER — Emergency Department (HOSPITAL_COMMUNITY)
Admission: EM | Admit: 2019-05-12 | Discharge: 2019-05-12 | Disposition: A | Discharge status: Home / Self Care | Payer: Medicaid Other | Attending: Emergency Medicine | Admitting: Emergency Medicine

## 2019-05-12 ENCOUNTER — Emergency Department (HOSPITAL_COMMUNITY)
Admission: EM | Admit: 2019-05-12 | Discharge: 2019-05-12 | Disposition: A | Discharge status: Home / Self Care | Payer: Medicaid Other | Source: Home / Self Care | Attending: Emergency Medicine | Admitting: Emergency Medicine

## 2019-05-12 ENCOUNTER — Emergency Department (HOSPITAL_COMMUNITY): Payer: Medicaid Other

## 2019-05-12 DIAGNOSIS — R600 Localized edema: Secondary | ICD-10-CM | POA: Diagnosis not present

## 2019-05-12 DIAGNOSIS — Z6841 Body Mass Index (BMI) 40.0 and over, adult: Secondary | ICD-10-CM | POA: Insufficient documentation

## 2019-05-12 DIAGNOSIS — I110 Hypertensive heart disease with heart failure: Secondary | ICD-10-CM | POA: Insufficient documentation

## 2019-05-12 DIAGNOSIS — Z79899 Other long term (current) drug therapy: Secondary | ICD-10-CM | POA: Insufficient documentation

## 2019-05-12 DIAGNOSIS — R0602 Shortness of breath: Secondary | ICD-10-CM | POA: Insufficient documentation

## 2019-05-12 DIAGNOSIS — R0789 Other chest pain: Secondary | ICD-10-CM | POA: Insufficient documentation

## 2019-05-12 DIAGNOSIS — Z8679 Personal history of other diseases of the circulatory system: Secondary | ICD-10-CM

## 2019-05-12 DIAGNOSIS — Z7984 Long term (current) use of oral hypoglycemic drugs: Secondary | ICD-10-CM | POA: Insufficient documentation

## 2019-05-12 DIAGNOSIS — E662 Morbid (severe) obesity with alveolar hypoventilation: Secondary | ICD-10-CM

## 2019-05-12 DIAGNOSIS — I5032 Chronic diastolic (congestive) heart failure: Secondary | ICD-10-CM | POA: Insufficient documentation

## 2019-05-12 DIAGNOSIS — Z87891 Personal history of nicotine dependence: Secondary | ICD-10-CM | POA: Diagnosis not present

## 2019-05-12 DIAGNOSIS — R079 Chest pain, unspecified: Secondary | ICD-10-CM | POA: Diagnosis not present

## 2019-05-12 DIAGNOSIS — Z7901 Long term (current) use of anticoagulants: Secondary | ICD-10-CM | POA: Insufficient documentation

## 2019-05-12 DIAGNOSIS — G4733 Obstructive sleep apnea (adult) (pediatric): Secondary | ICD-10-CM | POA: Insufficient documentation

## 2019-05-12 DIAGNOSIS — E119 Type 2 diabetes mellitus without complications: Secondary | ICD-10-CM | POA: Insufficient documentation

## 2019-05-12 DIAGNOSIS — I491 Atrial premature depolarization: Secondary | ICD-10-CM | POA: Diagnosis not present

## 2019-05-12 DIAGNOSIS — U071 COVID-19: Secondary | ICD-10-CM | POA: Diagnosis not present

## 2019-05-12 DIAGNOSIS — R58 Hemorrhage, not elsewhere classified: Secondary | ICD-10-CM | POA: Diagnosis not present

## 2019-05-12 LAB — CBC
HCT: 33.3 % — ABNORMAL LOW (ref 39.0–52.0)
Hemoglobin: 9.8 g/dL — ABNORMAL LOW (ref 13.0–17.0)
MCH: 23.2 pg — ABNORMAL LOW (ref 26.0–34.0)
MCHC: 29.4 g/dL — ABNORMAL LOW (ref 30.0–36.0)
MCV: 78.9 fL — ABNORMAL LOW (ref 80.0–100.0)
Platelets: 355 10*3/uL (ref 150–400)
RBC: 4.22 MIL/uL (ref 4.22–5.81)
RDW: 15.6 % — ABNORMAL HIGH (ref 11.5–15.5)
WBC: 8.1 10*3/uL (ref 4.0–10.5)
nRBC: 0 % (ref 0.0–0.2)

## 2019-05-12 LAB — BASIC METABOLIC PANEL
Anion gap: 11 (ref 5–15)
BUN: 15 mg/dL (ref 6–20)
CO2: 26 mmol/L (ref 22–32)
Calcium: 8.5 mg/dL — ABNORMAL LOW (ref 8.9–10.3)
Chloride: 100 mmol/L (ref 98–111)
Creatinine, Ser: 1.4 mg/dL — ABNORMAL HIGH (ref 0.61–1.24)
GFR calc Af Amer: >60 mL/min (ref >60)
GFR calc non Af Amer: 57 mL/min — ABNORMAL LOW (ref >60)
Glucose, Bld: 116 mg/dL — ABNORMAL HIGH (ref 70–99)
Potassium: 3.5 mmol/L (ref 3.5–5.1)
Sodium: 137 mmol/L (ref 135–145)

## 2019-05-12 LAB — TYPE AND SCREEN
ABO/RH(D): O POS
Antibody Screen: NEGATIVE

## 2019-05-12 LAB — TROPONIN I (HIGH SENSITIVITY)
Troponin I (High Sensitivity): 12 ng/L (ref <18)
Troponin I (High Sensitivity): 14 ng/L (ref <18)

## 2019-05-12 LAB — BRAIN NATRIURETIC PEPTIDE: B Natriuretic Peptide: 12.8 pg/mL (ref 0.0–100.0)

## 2019-05-12 MED ORDER — MORPHINE SULFATE (PF) 4 MG/ML IV SOLN
4.0000 mg | Freq: Once | INTRAVENOUS | Status: AC
  Administered 2019-05-12: 4 mg via INTRAVENOUS
  Filled 2019-05-12: qty 1

## 2019-05-12 MED ORDER — ONDANSETRON HCL 4 MG/2ML IJ SOLN
4.0000 mg | Freq: Once | INTRAMUSCULAR | Status: AC
  Administered 2019-05-12: 4 mg via INTRAVENOUS
  Filled 2019-05-12: qty 2

## 2019-05-12 MED ORDER — ALBUTEROL SULFATE HFA 108 (90 BASE) MCG/ACT IN AERS
4.0000 | INHALATION_SPRAY | Freq: Once | RESPIRATORY_TRACT | Status: AC
  Administered 2019-05-12: 4 via RESPIRATORY_TRACT
  Filled 2019-05-12: qty 6.7

## 2019-05-12 MED ORDER — IPRATROPIUM BROMIDE HFA 17 MCG/ACT IN AERS
2.0000 | INHALATION_SPRAY | Freq: Once | RESPIRATORY_TRACT | Status: DC
  Filled 2019-05-12: qty 12.9

## 2019-05-12 MED ORDER — FUROSEMIDE 10 MG/ML IJ SOLN
40.0000 mg | Freq: Once | INTRAMUSCULAR | Status: AC
  Administered 2019-05-12: 40 mg via INTRAVENOUS
  Filled 2019-05-12: qty 4

## 2019-05-12 MED ORDER — SODIUM CHLORIDE 0.9% FLUSH
3.0000 mL | Freq: Once | INTRAVENOUS | Status: AC
  Administered 2019-05-12: 3 mL via INTRAVENOUS

## 2019-05-12 NOTE — ED Triage Notes (Signed)
Pt BIB by [facility] with complaints of SOB and CP. EMS gave 324mg  Aspirin and 0.4 nitro with relief. Patient denies pain at this time. Pt drifting in and out of sleep during triage.

## 2019-05-12 NOTE — Discharge Instructions (Addendum)
Continue medications and oxygen as previously prescribed.  Follow-up with your cardiologist later this week and also keep your appointment for your sleep study.  Return to the emergency department in the meantime if symptoms worsen or change.

## 2019-05-12 NOTE — ED Notes (Signed)
Pt verbalized understanding of d/c instructions, pt reports he is aware of appointments and agrees to keep those appointments.  Pt called his ride and taken to front doors via WC. A & O, NAD

## 2019-05-12 NOTE — ED Triage Notes (Signed)
Pt BIB [facility] house c/o SOB, CP, and rectal bleeding.   CP x1 day given aspirin 324 and 0.4 NTG with no relief.  Rectal x2 days bright red with bowel movements  SOB chronic d/t fluid in lungs. Pt on 2L O2 at home Pt uses albuterol everyday, Pt can not recall last use.   Pt denies COVID +.  EMS VS BP 132/94 HR 92 SpO 97 2L T 97.5 CBG 130 (HX T2DM)

## 2019-05-12 NOTE — ED Notes (Signed)
Discharge instructions discussed with pt. Pt verbalized understanding with no questions at this time. Pt to continue home medications and oxygen at home. Pt to follow up with cardiology and appointment regarding sleep study.   Pt on 2L of continues oxygen at home. Pt place on O2 while waiting for girlfriend to pick up pt. Per Pt,  Girlfriend to bring home oxygen. Pt informed of importance to use oxygen continuously to avoid SOB.   Pt wheelchair outside waiting room per pt. request. Pt states "I will be alright" when offered to stay in waiting room. Pt states he has been unhappy with ED visit d/t Unable to find diagnosis. Pt states he will be seeking care at [facility].

## 2019-05-12 NOTE — ED Notes (Signed)
Pt reports it feels "like my throat is closing... I can't take air in... I can't breath."  Difficulty speaking

## 2019-05-12 NOTE — Discharge Instructions (Addendum)
1.  Go to your appointment with pulmonology for your sleep apnea tomorrow as scheduled. 2.  Return to the emergency department if you feel that you have developed any new or changed symptoms.

## 2019-05-12 NOTE — ED Provider Notes (Signed)
[facility] EMERGENCY DEPARTMENT Provider Note   CSN: [id number] Arrival date & time: 05/12/19  0123     History   Chief Complaint No chief complaint on file.   HPI Benjamin [name] is a 53 y.o. male.     Patient is a 53 year old male with past medical history of congestive heart failure, morbid obesity, diabetes, hypertension, paroxysmal atrial fibrillation, pulmonary embolism, and recent admission for COVID-19 infection.  He presents today for evaluation of shortness of breath and chest pain.  This started earlier this evening.  He describes sharp pain to the left lower front of his chest.  He denies fevers, chills, or cough.  He also reports rectal bleeding.  He denies abdominal pain or rectal pain.  Patient has been evaluated recently for similar complaints.  He had a colonoscopy/endoscopy showing only hemorrhoids.  The history is provided by the patient.    Past Medical History:  Diagnosis Date  . A-fib ([place])   . Diabetes mellitus without complication ([place])   . Elevated troponin 02/06/2019  . Hypertension   . Obesity   . Pulmonary embolism ([place])   . Sleep apnea     Patient Active Problem List   Diagnosis Date Noted  . Acute on chronic diastolic CHF (congestive heart failure) ([place]) 04/11/2019  . Iron deficiency anemia 03/11/2019  . OSA on CPAP 03/11/2019  . HLD (hyperlipidemia) 03/11/2019  . Elevated troponin 03/11/2019  . GERD (gastroesophageal reflux disease) 03/11/2019  . Rectal bleeding 02/20/2019  . Dyspnea 02/06/2019  . COVID-19 virus infection 02/06/2019  . Bilateral lower extremity edema   . Obesity, Class III, BMI 40-49.9 (morbid obesity) ([place])   . Chronic diastolic CHF (congestive heart failure) ([place])   . PAF (paroxysmal atrial fibrillation) ([place])   . PE (pulmonary thromboembolism) ([name]) 01/21/2019  . Hypertensive urgency 01/21/2019  . Diabetes mellitus type 2 in obese ([place]) 01/21/2019    Past Surgical History:  Procedure  Laterality Date  . COLONOSCOPY WITH PROPOFOL Left 02/25/2019   Procedure: COLONOSCOPY WITH PROPOFOL;  Surgeon: Arta Silence, MD;  Location: [place];  Service: Endoscopy;  Laterality: Left;  . ESOPHAGOGASTRODUODENOSCOPY (EGD) WITH PROPOFOL Left 02/25/2019   Procedure: ESOPHAGOGASTRODUODENOSCOPY (EGD) WITH PROPOFOL;  Surgeon: Arta Silence, MD;  Location[facility] ENDOSCOPY;  Service: Endoscopy;  Laterality: Left;  . NO PAST SURGERIES    . RIGHT HEART CATH N/A 04/17/2019   Procedure: RIGHT HEART CATH;  Surgeon: Larey Dresser, MD;  Location: [place] CV LAB;  Service: Cardiovascular;  Laterality: N/A;        Home Medications    Prior to Admission medications   Medication Sig Start Date End Date Taking? Authorizing Provider  albuterol (VENTOLIN HFA) 108 (90 Base) MCG/ACT inhaler Inhale into the lungs every 6 (six) hours as needed for wheezing or shortness of breath.   Yes [provider]  amLODipine (NORVASC) 10 MG tablet Take 1 tablet (10 mg total) by mouth daily. 04/20/19  Yes Hall, Carole N, DO  atorvastatin (LIPITOR) 20 MG tablet Take 1 tablet (20 mg total) by mouth at bedtime. 04/19/19  Yes Kayleen Memos, DO  carboxymethylcellulose (REFRESH PLUS) 0.5 % SOLN Place 1 drop into both eyes daily.    Yes [provider]  docusate sodium (COLACE) 100 MG capsule Take 1 capsule (100 mg total) by mouth 2 (two) times daily. 03/01/19  Yes Vasireddy, Grier Mitts, MD  ferrous sulfate 325 (65 FE) MG tablet Take 1 tablet (325 mg total) by mouth daily. Patient  will pick up scripts today. 03/18/19 06/16/19 Yes [name], NP  labetalol (NORMODYNE) 200 MG tablet Take 1 tablet (200 mg total) by mouth 2 (two) times daily. 04/19/19  Yes Kayleen Memos, DO  levalbuterol [facility] HFA) 45 MCG/ACT inhaler Inhale into the lungs every 4 (four) hours as needed for wheezing.   Yes [provider]  metFORMIN (GLUCOPHAGE) 500 MG tablet Take 1 tablet (500 mg total) by mouth 2 (two) times a day.  Patient will pick up scripts today. Patient taking differently: Take 500 mg by mouth daily with breakfast. Patient will pick up scripts today. 03/18/19 06/16/19 Yes [name], NP  pantoprazole (PROTONIX) 40 MG tablet Take 1 tablet (40 mg total) by mouth daily. 04/20/19  Yes Hall, Carole N, DO  polyethylene glycol (MIRALAX / GLYCOLAX) 17 g packet Take 17 g by mouth daily as needed for severe constipation. 03/01/19  Yes Vasireddy, Grier Mitts, MD  potassium chloride (K-DUR) 10 MEQ tablet Take 2 tablets (20 mEq total) by mouth daily. 04/19/19  Yes Kayleen Memos, DO  rivaroxaban (XARELTO) 20 MG TABS tablet Take 1 tablet (20 mg total) by mouth daily with supper. 04/19/19  Yes Kayleen Memos, DO  torsemide (DEMADEX) 20 MG tablet Take 2 tablets (40 mg total) by mouth 2 (two) times daily. 04/19/19  Yes Irene Pap N, DO  triamcinolone cream (KENALOG) 0.1 % Apply 1 application topically 3 (three) times daily as needed (rash/irritation).  02/01/19  Yes [provider]  Blood Glucose Monitoring Suppl (TRUE METRIX METER) w/Device KIT Use as instructed. Check blood glucose level by fingerstick twice per day. 03/18/19   [name], NP  glucose blood (TRUE METRIX BLOOD GLUCOSE TEST) test strip Use as instructed. Check blood glucose level by fingerstick twice per day. 03/18/19   [name], NP  TRUEplus Lancets 28G MISC Use as instructed. Check blood glucose level by fingerstick twice per day. 03/18/19   [name], NP    Family History Family History  Problem Relation Age of Onset  . Hypertension Mother   . Diabetes Mother     Social History Social History   Tobacco Use  . Smoking status: Former Research scientist (life sciences)  . Smokeless tobacco: Never Used  Substance Use Topics  . Alcohol use: Not Currently  . Drug use: Not Currently     Allergies   Patient has no known allergies.   Review of Systems Review of Systems  All other systems reviewed and are negative.    Physical Exam Updated  Vital Signs BP (!) 173/91   Pulse 89   Temp 98 F (36.7 C) (Oral)   Resp (!) 22   Ht _0  (1.803 m)   Wt (!) 184.6 kg   SpO2 97%   BMI 56.76 kg/m   Physical Exam Vitals signs and nursing note reviewed.  Constitutional:      General: He is not in acute distress.    Appearance: He is well-developed. He is not diaphoretic.  HENT:     Head: Normocephalic and atraumatic.  Neck:     Musculoskeletal: Normal range of motion and neck supple.  Cardiovascular:     Rate and Rhythm: Normal rate and regular rhythm.     Heart sounds: No murmur. No friction rub.     Comments: There is tenderness to palpation of the left lower anterior chest wall. Pulmonary:     Effort: Pulmonary effort is normal. No respiratory distress.     Breath sounds:  Normal breath sounds. No wheezing or rales.  Abdominal:     General: Bowel sounds are normal. There is no distension.     Palpations: Abdomen is soft.     Tenderness: There is no abdominal tenderness.  Musculoskeletal: Normal range of motion.     Right lower leg: Edema present.     Left lower leg: Edema present.     Comments: There is 1+ pitting edema of both lower extremities.  Skin:    General: Skin is warm and dry.  Neurological:     Mental Status: He is alert and oriented to person, place, and time.     Coordination: Coordination normal.      ED Treatments / Results  Labs (all labs ordered are listed, but only abnormal results are displayed) Labs Reviewed  BASIC METABOLIC PANEL  CBC  BRAIN NATRIURETIC PEPTIDE  TYPE AND SCREEN  TROPONIN I (HIGH SENSITIVITY)    EKG EKG Interpretation  Date/Time:  Sunday May 12 2019 01:44:58 EDT Ventricular Rate:  85 PR Interval:    QRS Duration: 98 QT Interval:  362 QTC Calculation: 431 R Axis:   70 Text Interpretation:  Sinus rhythm Nonspecific T abnormalities, lateral leads Confirmed by Veryl Speak [phone]) on 05/12/2019 1:53:12 AM   Radiology No results found.  Procedures  Procedures (including critical care time)  Medications Ordered in ED Medications  sodium chloride flush (NS) 0.9 % injection 3 mL (has no administration in time range)  morphine 4 MG/ML injection 4 mg (has no administration in time range)  ondansetron (ZOFRAN) injection 4 mg (has no administration in time range)     Initial Impression / Assessment and Plan / ED Course  I have reviewed the triage vital signs and the nursing notes.  Pertinent labs & imaging results that were available during my care of the patient were reviewed by me and considered in my medical decision making (see chart for details).  Patient with history of morbid obesity and CHF presenting with complaints of shortness of breath.  His work-up reveals no evidence for an acute cardiac event.  His troponin is negative and EKG is unchanged.  Chest x-ray shows no evidence of pneumonia, but does show stable cardiomegaly and prominence of bronchovascular markings.  His BNP is 13.  Patient was given Lasix here in the ER with a good diuresis.  He has been observed for several hours.  His oxygen saturations have been adequate on his normal oxygen he is on at home, however do drop when he sleeps.  Patient does have a sleep study upcoming and I suspect he does have obstructive sleep apnea.    Nothing today indicates he requires admission.  I will have him follow-up with his cardiologist, keep his appointment for his sleep study, and return as needed for any problems.  Final Clinical Impressions(s) / ED Diagnoses   Final diagnoses:  None    ED Discharge Orders    None       Veryl Speak, MD 05/12/19 [phone]

## 2019-05-12 NOTE — ED Notes (Signed)
EDP at bedside  

## 2019-05-12 NOTE — ED Provider Notes (Signed)
[facility] EMERGENCY DEPARTMENT Provider Note   CSN: [id number] Arrival date & time: 05/12/19  1751     History   Chief Complaint Chief Complaint  Patient presents with   Shortness of Breath    HPI Benjamin [name] is a 53 y.o. male.     HPI Patient reports that he thinks that the people at the halfway house where he is staying really overreacted and called EMS when he did not need it.  He reports that he knows that he has sleep apnea, and when he goes to sleep he starts choking and not breathing well.  He reports that he has an appointment tomorrow for his pulmonology follow-up for sleep apnea and will be getting a sleep study this week.  He reports nothing different happen today is just that the people who witnessed that did not know what it was going on and they were afraid he would die.  Patient reports that he knows that there is not a lot that can be done given his underlying medical problems but that he needs to the CPAP machine which was supposed to have been delivered to him earlier.  He reports he thinks he is fine to make it to his appointment tomorrow and get his studies done this week as planned.  He reports nothing changed since he left the hospital except for that he went to sleep and had an episode of sleep apnea. Past Medical History:  Diagnosis Date   A-fib [facility])    Diabetes mellitus without complication ([place])    Elevated troponin 02/06/2019   Hypertension    Obesity    Pulmonary embolism ([facility])    Sleep apnea     Patient Active Problem List   Diagnosis Date Noted   Acute on chronic diastolic CHF (congestive heart failure) ([place]) 04/11/2019   Iron deficiency anemia 03/11/2019   OSA on CPAP 03/11/2019   HLD (hyperlipidemia) 03/11/2019   Elevated troponin 03/11/2019   GERD (gastroesophageal reflux disease) 03/11/2019   Rectal bleeding 02/20/2019   Dyspnea 02/06/2019   COVID-19 virus infection 02/06/2019   Bilateral lower  extremity edema    Obesity, Class III, BMI 40-49.9 (morbid obesity) ([facility])    Chronic diastolic CHF (congestive heart failure) ([facility])    PAF (paroxysmal atrial fibrillation) ([place])    PE (pulmonary thromboembolism) ([place]) 01/21/2019   Hypertensive urgency 01/21/2019   Diabetes mellitus type 2 in obese ([place]) 01/21/2019    Past Surgical History:  Procedure Laterality Date   COLONOSCOPY WITH PROPOFOL Left 02/25/2019   Procedure: COLONOSCOPY WITH PROPOFOL;  Surgeon: Arta Silence, MD;  Location: [place];  Service: Endoscopy;  Laterality: Left;   ESOPHAGOGASTRODUODENOSCOPY (EGD) WITH PROPOFOL Left 02/25/2019   Procedure: ESOPHAGOGASTRODUODENOSCOPY (EGD) WITH PROPOFOL;  Surgeon: Arta Silence, MD;  Location[facility] ENDOSCOPY;  Service: Endoscopy;  Laterality: Left;   NO PAST SURGERIES     RIGHT HEART CATH N/A 04/17/2019   Procedure: RIGHT HEART CATH;  Surgeon: Larey Dresser, MD;  Location: [place] CV LAB;  Service: Cardiovascular;  Laterality: N/A;        Home Medications    Prior to Admission medications   Medication Sig Start Date End Date Taking? Authorizing Provider  albuterol (VENTOLIN HFA) 108 (90 Base) MCG/ACT inhaler Inhale into the lungs every 6 (six) hours as needed for wheezing or shortness of breath.    [provider]  amLODipine (NORVASC) 10 MG tablet Take 1 tablet (10 mg total) by mouth daily. 04/20/19  Irene Pap N, DO  atorvastatin (LIPITOR) 20 MG tablet Take 1 tablet (20 mg total) by mouth at bedtime. 04/19/19   Kayleen Memos, DO  Blood Glucose Monitoring Suppl (TRUE METRIX METER) w/Device KIT Use as instructed. Check blood glucose level by fingerstick twice per day. 03/18/19   [name], NP  carboxymethylcellulose (REFRESH PLUS) 0.5 % SOLN Place 1 drop into both eyes daily.     [provider]  docusate sodium (COLACE) 100 MG capsule Take 1 capsule (100 mg total) by mouth 2 (two) times daily. 03/01/19   Monica Becton, MD  ferrous  sulfate 325 (65 FE) MG tablet Take 1 tablet (325 mg total) by mouth daily. Patient will pick up scripts today. 03/18/19 06/16/19  [name], NP  glucose blood (TRUE METRIX BLOOD GLUCOSE TEST) test strip Use as instructed. Check blood glucose level by fingerstick twice per day. 03/18/19   [name], NP  labetalol (NORMODYNE) 200 MG tablet Take 1 tablet (200 mg total) by mouth 2 (two) times daily. 04/19/19   Kayleen Memos, DO  levalbuterol [facility] HFA) 45 MCG/ACT inhaler Inhale into the lungs every 4 (four) hours as needed for wheezing.    [provider]  metFORMIN (GLUCOPHAGE) 500 MG tablet Take 1 tablet (500 mg total) by mouth 2 (two) times a day. Patient will pick up scripts today. Patient taking differently: Take 500 mg by mouth 2 (two) times daily with a meal. Patient will pick up scripts today. 03/18/19 06/16/19  [name], NP  pantoprazole (PROTONIX) 40 MG tablet Take 1 tablet (40 mg total) by mouth daily. 04/20/19   Kayleen Memos, DO  polyethylene glycol (MIRALAX / GLYCOLAX) 17 g packet Take 17 g by mouth daily as needed for severe constipation. 03/01/19   Monica Becton, MD  potassium chloride (K-DUR) 10 MEQ tablet Take 2 tablets (20 mEq total) by mouth daily. 04/19/19   Kayleen Memos, DO  rivaroxaban (XARELTO) 20 MG TABS tablet Take 1 tablet (20 mg total) by mouth daily with supper. 04/19/19   Kayleen Memos, DO  torsemide (DEMADEX) 20 MG tablet Take 2 tablets (40 mg total) by mouth 2 (two) times daily. 04/19/19   Kayleen Memos, DO  triamcinolone cream (KENALOG) 0.1 % Apply 1 application topically 3 (three) times daily as needed (rash/irritation).  02/01/19   [provider]  TRUEplus Lancets 28G MISC Use as instructed. Check blood glucose level by fingerstick twice per day. 03/18/19   [name], NP    Family History Family History  Problem Relation Age of Onset   Hypertension Mother    Diabetes Mother     Social History Social History    Tobacco Use   Smoking status: Former Smoker   Smokeless tobacco: Never Used  Substance Use Topics   Alcohol use: Not Currently   Drug use: Not Currently     Allergies   Patient has no known allergies.   Review of Systems Review of Systems 10 Systems reviewed and are negative for acute change except as noted in the HPI.   Physical Exam Updated Vital Signs Pulse 94    Temp 98.4 F (36.9 C) (Oral)    Resp 20    Ht _0  (1.803 m)    Wt (!) 186.4 kg    SpO2 97%    BMI 57.32 kg/m   Physical Exam Constitutional:      Comments: Patient is alert and in no acute distress.  He does have morbid obesity.  HENT:     Head: Normocephalic and atraumatic.  Eyes:     Extraocular Movements: Extraocular movements intact.  Cardiovascular:     Rate and Rhythm: Normal rate and regular rhythm.  Pulmonary:     Comments: Patient has mild increased work of breathing consistent with significant obesity.  He does not appear to be in distress.  His oxygen saturation on baseline oxygen is at 99%.  He has airflow bilaterally with occasional wheeze but adequate airflow to the lower lung fields. Abdominal:     Palpations: Abdomen is soft.     Tenderness: There is no abdominal tenderness.  Musculoskeletal:     Comments: Patient appears to have chronic peripheral edema.  About 2+ bilaterally.  Calves nontender.  Skin:    General: Skin is warm and dry.  Neurological:     General: No focal deficit present.     Mental Status: He is oriented to person, place, and time.     Coordination: Coordination normal.  Psychiatric:        Mood and Affect: Mood normal.      ED Treatments / Results  Labs (all labs ordered are listed, but only abnormal results are displayed) Labs Reviewed - No data to display  EKG None  Radiology Dg Chest Portable 1 View  Result Date: 05/12/2019 CLINICAL DATA:  Chest pain and shortness of breath. EXAM: PORTABLE CHEST 1 VIEW COMPARISON:  04/11/2019. CT 02/26/2019  FINDINGS: Unchanged cardiomegaly. Unchanged mediastinal contours. Prominence of bronchovascular markings peers similar to prior exam. No evidence of new airspace disease. No large pleural effusion or pneumothorax. Detailed evaluation limited by soft tissue attenuation from habitus. IMPRESSION: Stable cardiomegaly and prominence of bronchovascular markings. No new abnormality is seen. Electronically Signed   By: Keith Rake M.D.   On: 05/12/2019 02:18    Procedures Procedures (including critical care time)  Medications Ordered in ED Medications  albuterol (VENTOLIN HFA) 108 (90 Base) MCG/ACT inhaler 4 puff (has no administration in time range)  ipratropium (ATROVENT HFA) inhaler 2 puff (has no administration in time range)     Initial Impression / Assessment and Plan / ED Course  I have reviewed the triage vital signs and the nursing notes.  Pertinent labs & imaging results that were available during my care of the patient were reviewed by me and considered in my medical decision making (see chart for details).       Patient has significant chronic underlying medical conditions.  He has severe obesity which certainly is a factor in sleep apnea and hypoventilation syndrome.  Patient reports that that is what occurred today and that he does not think there was anything that was wrong with him that not chronic is just that the people who witnessed him sleeping and not breathing did not understand what was going on.  Diagnostic studies from earlier today were stable.  Patient's vital signs are stable.  At this time I do feel he is stable to follow-up as planned for further diagnostic evaluation this week to try to treat this condition.  Final Clinical Impressions(s) / ED Diagnoses   Final diagnoses:  Obstructive sleep apnea syndrome  Obesity hypoventilation syndrome [facility])    ED Discharge Orders    None       Charlesetta Shanks, MD 05/12/19 [phone]

## 2019-05-12 NOTE — ED Notes (Addendum)
Pt decrease SpO2 while sleeping. Pt states having sleep study scheduled for August 28th HX sleep apnea

## 2019-05-13 ENCOUNTER — Other Ambulatory Visit (HOSPITAL_COMMUNITY): Payer: Self-pay

## 2019-05-13 ENCOUNTER — Telehealth (HOSPITAL_COMMUNITY): Payer: Self-pay | Admitting: Licensed Clinical Social Worker

## 2019-05-13 NOTE — Telephone Encounter (Signed)
Called and spoke with [name], [facility], [phone].  Dr Juanetta Gosling recommendations given.  [name] stated Patient was getting a donated cpap, and per VS's recommendation, she would have it sent to patient before 05/17/19 sleep study. Settings given to [name].  Understanding stated.  Nothing further at this time.

## 2019-05-13 NOTE — Progress Notes (Signed)
Got pt a pill box and brought to him and filled up pill box for him.    [name], EMT-Paramedic  05/13/19

## 2019-05-13 NOTE — Progress Notes (Signed)
Paramedicine Encounter    Patient ID: Benjamin [name], male    DOB: [birth year], 53 y.o.   MRN: [id number]   Patient Care Team: [name], NP as PCP - General (Nurse Practitioner) [name], MD as PCP - Cardiology (Cardiology) [name], LCSW as Social Worker (Licensed Clinical Social Worker)  Patient Active Problem List   Diagnosis Date Noted  . Acute on chronic diastolic CHF (congestive heart failure) ([place]) 04/11/2019  . Iron deficiency anemia 03/11/2019  . OSA on CPAP 03/11/2019  . HLD (hyperlipidemia) 03/11/2019  . Elevated troponin 03/11/2019  . GERD (gastroesophageal reflux disease) 03/11/2019  . Rectal bleeding 02/20/2019  . Dyspnea 02/06/2019  . COVID-19 virus infection 02/06/2019  . Bilateral lower extremity edema   . Obesity, Class III, BMI 40-49.9 (morbid obesity) ([place])   . Chronic diastolic CHF (congestive heart failure) ([place])   . PAF (paroxysmal atrial fibrillation) ([place])   . PE (pulmonary thromboembolism) ([place]) 01/21/2019  . Hypertensive urgency 01/21/2019  . Diabetes mellitus type 2 in obese ([place]) 01/21/2019    Current Outpatient Medications:  .  albuterol (VENTOLIN HFA) 108 (90 Base) MCG/ACT inhaler, Inhale into the lungs every 6 (six) hours as needed for wheezing or shortness of breath., Disp: , Rfl:  .  amLODipine (NORVASC) 10 MG tablet, Take 1 tablet (10 mg total) by mouth daily., Disp: 60 tablet, Rfl: 0 .  atorvastatin (LIPITOR) 20 MG tablet, Take 1 tablet (20 mg total) by mouth at bedtime., Disp: 60 tablet, Rfl: 0 .  Blood Glucose Monitoring Suppl (TRUE METRIX METER) w/Device KIT, Use as instructed. Check blood glucose level by fingerstick twice per day., Disp: 1 kit, Rfl: 0 .  carboxymethylcellulose (REFRESH PLUS) 0.5 % SOLN, Place 1 drop into both eyes daily. , Disp: , Rfl:  .  docusate sodium (COLACE) 100 MG capsule, Take 1 capsule (100 mg total) by mouth 2 (two) times daily., Disp: 60 capsule, Rfl: 2 .  ferrous sulfate 325 (65 FE) MG  tablet, Take 1 tablet (325 mg total) by mouth daily. Patient will pick up scripts today., Disp: 90 tablet, Rfl: 0 .  glucose blood (TRUE METRIX BLOOD GLUCOSE TEST) test strip, Use as instructed. Check blood glucose level by fingerstick twice per day., Disp: 100 each, Rfl: 12 .  labetalol (NORMODYNE) 200 MG tablet, Take 1 tablet (200 mg total) by mouth 2 (two) times daily., Disp: 120 tablet, Rfl: 0 .  levalbuterol (XOPENEX HFA) 45 MCG/ACT inhaler, Inhale into the lungs every 4 (four) hours as needed for wheezing., Disp: , Rfl:  .  metFORMIN (GLUCOPHAGE) 500 MG tablet, Take 1 tablet (500 mg total) by mouth 2 (two) times a day. Patient will pick up scripts today. (Patient taking differently: Take 500 mg by mouth 2 (two) times daily with a meal. Patient will pick up scripts today.), Disp: 180 tablet, Rfl: 0 .  potassium chloride (K-DUR) 10 MEQ tablet, Take 2 tablets (20 mEq total) by mouth daily., Disp: 60 tablet, Rfl: 0 .  rivaroxaban (XARELTO) 20 MG TABS tablet, Take 1 tablet (20 mg total) by mouth daily with supper., Disp: 60 tablet, Rfl: 0 .  torsemide (DEMADEX) 20 MG tablet, Take 2 tablets (40 mg total) by mouth 2 (two) times daily., Disp: 240 tablet, Rfl: 0 .  triamcinolone cream (KENALOG) 0.1 %, Apply 1 application topically 3 (three) times daily as needed (rash/irritation). , Disp: , Rfl:  .  TRUEplus Lancets 28G MISC, Use as instructed. Check blood glucose level by fingerstick  twice per day., Disp: 200 each, Rfl: 3 .  pantoprazole (PROTONIX) 40 MG tablet, Take 1 tablet (40 mg total) by mouth daily. (Patient not taking: Reported on 05/13/2019), Disp: 60 tablet, Rfl: 0 .  polyethylene glycol (MIRALAX / GLYCOLAX) 17 g packet, Take 17 g by mouth daily as needed for severe constipation. (Patient not taking: Reported on 05/13/2019), Disp: 14 each, Rfl: 0 No Known Allergies    Social History   Socioeconomic History  . Marital status: Married    Spouse name: Not on file  . Number of children: Not on  file  . Years of education: Not on file  . Highest education level: Not on file  Occupational History  . Not on file  Social Needs  . Financial resource strain: Not on file  . Food insecurity    Worry: Not on file    Inability: Not on file  . Transportation needs    Medical: Not on file    Non-medical: Not on file  Tobacco Use  . Smoking status: Former Research scientist (life sciences)  . Smokeless tobacco: Never Used  Substance and Sexual Activity  . Alcohol use: Not Currently  . Drug use: Not Currently  . Sexual activity: Not Currently  Lifestyle  . Physical activity    Days per week: Not on file    Minutes per session: Not on file  . Stress: Not on file  Relationships  . Social Herbalist on phone: Not on file    Gets together: Not on file    Attends religious service: Not on file    Active member of club or organization: Not on file    Attends meetings of clubs or organizations: Not on file    Relationship status: Not on file  . Intimate partner violence    Fear of current or ex partner: Not on file    Emotionally abused: Not on file    Physically abused: Not on file    Forced sexual activity: Not on file  Other Topics Concern  . Not on file  Social History Narrative  . Not on file    Physical Exam      Future Appointments  Date Time Provider [place]  05/14/2019 11:30 AM MC-SCREENING MC-SD[facility] None  05/16/2019  1:30 PM [facility] FINANCIAL COUNSELOR [facility] None  05/17/2019  8:00 PM [name], MD MSD-S[facility]EL MSD  05/28/2019 10:00 AM [facility] PA/NP MC[facility] None  06/04/2019  3:30 PM [name], NP [facility]W-[facility] None    BP (!) 144/100   Pulse 88   Temp 97.7 F (36.5 C)   Resp (!) 22   Wt (!) 408 lb (185.1 kg)   SpO2 93%   BMI 56.90 kg/m   Weight yesterday-412   First visit with pt, he is staying at [facility],  (facility for individuals released from prison to transition back to public) He originally was due to stay here until June, he caught COVID  earlier this year and his time at the facility ran out in June, the charity here was able to get extension for him to stay here for 6 more months.  He has limited transportation.  [name] is working on cpap for him, Sleep study is set up for Friday. Pt is aware of appointment and where to go and has transportation set up. Will need to get him fill out scat app for further mode of transportation.  He has no family in [place], they are in [place].  Once he  leaves this facility in December, his plan is to be approved for disability and get more permament housing and be independent.   He does get sob with short distances, he states he still gets abd muscle cramps that was left from the covid.  He states he was given scales, he has been weighing daily.  He gets food here. He said the cook here fixes him low sodium foods.  He said he has been having issues with straining with BM, he has been constipated for 5 days.  He noticed bright red bleeding during the BM.  He states his diabetes is under control. Last check was 90.   -he needs pantoprazole, needs miralax also--will get that called in. --sent message to see about getting that filled.  He is under the blue card program at [facility] for medications.   Reviewed meds with him--I am out of pill boxes in my car, will have to get more and then we will set up pill box. He sometimes does not take his atorvastatin.  He wants to participate for the cardiac rehab--not sure if he was recommend or not.  He has not had his meds yet this morning, he wanted to review meds with me first.  He states he gets chest tightness when he gets up and moving around too much, he isnt using his 02 when he is up and moving.   [name], [facility] Paramedic  05/13/19

## 2019-05-13 NOTE — Telephone Encounter (Addendum)
CSW received call back from [place] regarding pt need for CPAP.  Though sleep study is scheduled for Friday the MD is confident that CPAP will be necessary and would prefer for patient to get CPAP as soon as possible.  CSW informed [place] Surveyor, quantity of what MD said and he is agreeable to purchase CPAP for patient- CSW completed LOG request and sent to [facility] who faxed to [employer].  CSW also spoke with Suanne Marker from [employer] who will get with respiratory therapist to confirm a time to meet with patient.  CSW also received message from [employer] that pt will have more limited transportation resources through his halfway house because the insurance he was being provided following release from prison has expired- though pt has been approved to stay longer at the halfway house due to his hospital stay apparently this does not extend to all benefits including transportation.  CSW sent paramedic SCAT application for pt to fill out and return to CSW for completion.   CSW will continue to follow and assist as needed  [name], [facility] Desk#: [phone] Cell#: [phone]

## 2019-05-14 ENCOUNTER — Telehealth (HOSPITAL_COMMUNITY): Payer: Self-pay | Admitting: Licensed Clinical Social Worker

## 2019-05-14 ENCOUNTER — Other Ambulatory Visit (HOSPITAL_COMMUNITY)
Admission: RE | Admit: 2019-05-14 | Discharge: 2019-05-14 | Disposition: A | Discharge status: Home / Self Care | Payer: Medicaid Other | Source: Ambulatory Visit | Attending: Pulmonary Disease | Admitting: Pulmonary Disease

## 2019-05-14 DIAGNOSIS — Z20828 Contact with and (suspected) exposure to other viral communicable diseases: Secondary | ICD-10-CM | POA: Insufficient documentation

## 2019-05-14 DIAGNOSIS — Z01812 Encounter for preprocedural laboratory examination: Secondary | ICD-10-CM | POA: Diagnosis not present

## 2019-05-14 LAB — SARS CORONAVIRUS 2 (TAT 6-24 HRS): SARS Coronavirus 2: NEGATIVE

## 2019-05-14 NOTE — Telephone Encounter (Signed)
CSW confirmed with [employer] that they have everything they need from [place] to get pt the CPAP at this time- they will plan to reach out to pt to set up appt for respiratory therapist to come out and completed training- pt also agreeable to being enrolled with [employer] patient support program so that can follow up with patient for the first month he has the CPAP.  CSW will continue to follow and assist as needed  [name], [facility] Desk#: [phone] Cell#: [phone]

## 2019-05-14 NOTE — Telephone Encounter (Signed)
Entered in error

## 2019-05-15 ENCOUNTER — Telehealth (HOSPITAL_COMMUNITY): Payer: Self-pay | Admitting: Licensed Clinical Social Worker

## 2019-05-15 NOTE — Telephone Encounter (Signed)
CSW received call from [employer] rep that pt is set up with respiratory therapist appt tomorrow morning to deliver and do training on CPAP.  CSW will continue to follow and assist as needed  Benjamin [name], [facility] Desk#: [phone] Cell#: [phone]

## 2019-05-16 ENCOUNTER — Telehealth (HOSPITAL_COMMUNITY): Payer: Self-pay | Admitting: Licensed Clinical Social Worker

## 2019-05-16 ENCOUNTER — Ambulatory Visit: Payer: Self-pay | Attending: Family Medicine

## 2019-05-16 ENCOUNTER — Other Ambulatory Visit: Payer: Self-pay

## 2019-05-16 ENCOUNTER — Other Ambulatory Visit (HOSPITAL_COMMUNITY): Payer: Self-pay

## 2019-05-16 ENCOUNTER — Telehealth (HOSPITAL_COMMUNITY): Payer: Self-pay | Admitting: *Deleted

## 2019-05-16 ENCOUNTER — Telehealth: Payer: Self-pay

## 2019-05-16 NOTE — Progress Notes (Signed)
Came out today to assist with SCAT application, it was signed, filled out and turned in.   [name], EMT-Paramedic 05/16/19

## 2019-05-16 NOTE — Telephone Encounter (Signed)
Paramedic returned pt completed SCAT application- CSW completed provider portion and emailed to referral department with SCAT  CSW will continue to follow and assist as needed  [name], [facility] Desk#: [phone] Cell#: [phone]

## 2019-05-16 NOTE — Telephone Encounter (Signed)
Message received from [name], EMT noting that the patient reports constipation and bleeding hemorrhoids as well as reflux.  She wanted to know if the patient could benefit from miralax and pantoprazole.

## 2019-05-16 NOTE — Telephone Encounter (Signed)
Made multiple attempts to reach Mr. Wiederholt today at the contact number on file. We were connected several different times and were unable to remain connected on the line due to conditions at his temporary living facility. We we unable to complete the one week follow-up as planned in the agreement in the initial encounter last week (05/09/2019). Will attempt again tomorrow.    Landis Martins, MS, ACSM-RCEP Clinical Exercise Physiologist

## 2019-05-17 ENCOUNTER — Ambulatory Visit (HOSPITAL_BASED_OUTPATIENT_CLINIC_OR_DEPARTMENT_OTHER): Payer: Medicaid Other | Attending: Pulmonary Disease | Admitting: Pulmonary Disease

## 2019-05-17 ENCOUNTER — Other Ambulatory Visit: Payer: Self-pay

## 2019-05-17 ENCOUNTER — Telehealth (HOSPITAL_COMMUNITY): Payer: Self-pay | Admitting: *Deleted

## 2019-05-17 DIAGNOSIS — G4730 Sleep apnea, unspecified: Secondary | ICD-10-CM | POA: Diagnosis present

## 2019-05-17 DIAGNOSIS — E662 Morbid (severe) obesity with alveolar hypoventilation: Secondary | ICD-10-CM

## 2019-05-17 DIAGNOSIS — E669 Obesity, unspecified: Secondary | ICD-10-CM | POA: Insufficient documentation

## 2019-05-17 DIAGNOSIS — Z6841 Body Mass Index (BMI) 40.0 and over, adult: Secondary | ICD-10-CM | POA: Insufficient documentation

## 2019-05-17 DIAGNOSIS — E119 Type 2 diabetes mellitus without complications: Secondary | ICD-10-CM | POA: Diagnosis not present

## 2019-05-17 DIAGNOSIS — G4736 Sleep related hypoventilation in conditions classified elsewhere: Secondary | ICD-10-CM | POA: Insufficient documentation

## 2019-05-17 DIAGNOSIS — G4733 Obstructive sleep apnea (adult) (pediatric): Secondary | ICD-10-CM | POA: Insufficient documentation

## 2019-05-17 NOTE — Telephone Encounter (Addendum)
Previous goals: - Walk 3x5 minute bouts every day with a daily log of emotions, feelings, thoughts. - Continue replacing and substituting cheeseburgers with salads and frostys with bananas   - Weigh every day  - No sodas allowed in living facility  Progress notes:  Mr. Goldie has had a busy schedule in the last week with an emergency department visit and getting his CPAP fitted and delivered. Patient stated he is relying on his oxygen more than normal. He stated since he is trying to move more, and that is why he is relying on his oxygen more. He does not wear this until he stops and rests from exertion. He feels very satisfied and encouraged with his weight loss, although he realizes most of that is water weight. Mr. Acree wants to be able to walk upstairs without his legs burning or being short of breath as he feels embarrassed. He rates this past week's leg burning with exertion as a 10/10. He is walking the ramp 3 times a day and his legs hurt and burn. He wants to achieve 5 times on lap. He has also been talking up and down the stairs 4x a day with some stopping mid-way. He would like to achieve 5 times a day. He made the effort and completed to journal how he felt in the start of his new weekly actions, and stated "this is really hard".Mr. Adriano stated the living facility will not allow him to have sodas or any outside food (burgers, other fast food) as he has previously "snuck" these items into the facility. He believes this initially made him angry but he is now thankful for the backing and support as he is adjusting.   Achievements: - Still achieving cheeseburger and frosty substitution with salads and bananas o Living facility no longer allowing him to "sneak them in"  - Still weighing every day (starting 446lbs to 401 today in about 2 months time-he realizes some of this is water weight).  - Journaled the 1st day of his achievements (walked ramp outside 3 times)  - Walked 18 steps 4x  a day, typically stops half-way.  -  Challenges/frustrations  - Other medical confusions and busy schedule interfering with completion of actions and journaling the other days in the past week.  - Communication in the living facility with his medical teams at [facility]  - Burning legs and shortness of breath - Shares he is "embarassed"  Next Week to report for next Session: GOALS - Walk the ramp 4 times (2 days) - Walk upstairs 5 times (2 days)  - Journal/document daily  o Rate leg burning "crying out"10/10 with stair climbing  o Feelings with diet and exercise regimen  o Achieved? (if no, and why not and how do you feel about not achieving?)  - Continue with diet improvement-receiving guidance elsewhere in this area   Overall goal:  - Weight loss (300 lbs)  - "Get my breath back"  - "Get my life back, get on my own again"    Mr. Umanzor shared great appreciation for the call and expressed much interest in continuing. He shared that he was looking forward to meeting again next week and that this accountability partnership is aligning with his goals and would like to continue. CEP will follow-up next week and make adjustments as needed. Patient agreeable with the weekly plan and follow-up next week.    Landis Martins, MS, ACSM-RCEP Clinical Exercise Physiologist

## 2019-05-19 ENCOUNTER — Other Ambulatory Visit: Payer: Self-pay | Admitting: Nurse Practitioner

## 2019-05-19 MED ORDER — SENNOSIDES-DOCUSATE SODIUM 8.6-50 MG PO TABS: 2.0000 | ORAL_TABLET | Freq: Every day | ORAL | 3 refills | Status: DC

## 2019-05-19 MED ORDER — OMEPRAZOLE 20 MG PO CPDR: 20.0000 mg | DELAYED_RELEASE_CAPSULE | Freq: Every day | ORAL | 3 refills | Status: DC

## 2019-05-19 NOTE — Telephone Encounter (Signed)
Medications sent to pharmacy    Thank you!

## 2019-05-20 ENCOUNTER — Other Ambulatory Visit (HOSPITAL_COMMUNITY): Payer: Self-pay

## 2019-05-20 ENCOUNTER — Other Ambulatory Visit: Payer: Self-pay

## 2019-05-20 MED FILL — FERROUS SULFATE 325 MG TAB: 325 (65 FE) | 30 days supply | Qty: 30 | Fill #1

## 2019-05-20 MED FILL — ?OMEPRAZOLE 20MG CAP DR: 20 | 30 days supply | Qty: 30 | Fill #0

## 2019-05-20 NOTE — Telephone Encounter (Signed)
Message sent to [name], EMT noting that the prescriptions were sent to the pharmacy.

## 2019-05-20 NOTE — Progress Notes (Signed)
Paramedicine Encounter    Patient ID: Benjamin [name], male    DOB: 16-Dec-1965, 53 y.o.   MRN: [id number]   Patient Care Team: [name], NP as PCP - General (Nurse Practitioner) [name], MD as PCP - Cardiology (Cardiology) [name], LCSW as Social Worker (Licensed Clinical Social Worker)  Patient Active Problem List   Diagnosis Date Noted  . Acute on chronic diastolic CHF (congestive heart failure) ([place]) 04/11/2019  . Iron deficiency anemia 03/11/2019  . OSA on CPAP 03/11/2019  . HLD (hyperlipidemia) 03/11/2019  . Elevated troponin 03/11/2019  . GERD (gastroesophageal reflux disease) 03/11/2019  . Rectal bleeding 02/20/2019  . Dyspnea 02/06/2019  . COVID-19 virus infection 02/06/2019  . Bilateral lower extremity edema   . Obesity, Class III, BMI 40-49.9 (morbid obesity) ([place])   . Chronic diastolic CHF (congestive heart failure) ([place])   . PAF (paroxysmal atrial fibrillation) ([place])   . PE (pulmonary thromboembolism) ([place]) 01/21/2019  . Hypertensive urgency 01/21/2019  . Diabetes mellitus type 2 in obese ([place]) 01/21/2019    Current Outpatient Medications:  .  albuterol (VENTOLIN HFA) 108 (90 Base) MCG/ACT inhaler, Inhale into the lungs every 6 (six) hours as needed for wheezing or shortness of breath., Disp: , Rfl:  .  amLODipine (NORVASC) 10 MG tablet, Take 1 tablet (10 mg total) by mouth daily., Disp: 60 tablet, Rfl: 0 .  atorvastatin (LIPITOR) 20 MG tablet, Take 1 tablet (20 mg total) by mouth at bedtime., Disp: 60 tablet, Rfl: 0 .  Blood Glucose Monitoring Suppl (TRUE METRIX METER) w/Device KIT, Use as instructed. Check blood glucose level by fingerstick twice per day., Disp: 1 kit, Rfl: 0 .  carboxymethylcellulose (REFRESH PLUS) 0.5 % SOLN, Place 1 drop into both eyes daily. , Disp: , Rfl:  .  docusate sodium (COLACE) 100 MG capsule, Take 1 capsule (100 mg total) by mouth 2 (two) times daily., Disp: 60 capsule, Rfl: 2 .  ferrous sulfate 325 (65 FE) MG  tablet, Take 1 tablet (325 mg total) by mouth daily. Patient will pick up scripts today., Disp: 90 tablet, Rfl: 0 .  glucose blood (TRUE METRIX BLOOD GLUCOSE TEST) test strip, Use as instructed. Check blood glucose level by fingerstick twice per day., Disp: 100 each, Rfl: 12 .  labetalol (NORMODYNE) 200 MG tablet, Take 1 tablet (200 mg total) by mouth 2 (two) times daily., Disp: 120 tablet, Rfl: 0 .  levalbuterol (XOPENEX HFA) 45 MCG/ACT inhaler, Inhale into the lungs every 4 (four) hours as needed for wheezing., Disp: , Rfl:  .  metFORMIN (GLUCOPHAGE) 500 MG tablet, Take 1 tablet (500 mg total) by mouth 2 (two) times a day. Patient will pick up scripts today. (Patient taking differently: Take 500 mg by mouth 2 (two) times daily with a meal. Patient will pick up scripts today.), Disp: 180 tablet, Rfl: 0 .  potassium chloride (K-DUR) 10 MEQ tablet, Take 2 tablets (20 mEq total) by mouth daily., Disp: 60 tablet, Rfl: 0 .  rivaroxaban (XARELTO) 20 MG TABS tablet, Take 1 tablet (20 mg total) by mouth daily with supper., Disp: 60 tablet, Rfl: 0 .  senna-docusate (SENOKOT-S) 8.6-50 MG tablet, Take 2 tablets by mouth daily., Disp: 60 tablet, Rfl: 3 .  torsemide (DEMADEX) 20 MG tablet, Take 2 tablets (40 mg total) by mouth 2 (two) times daily., Disp: 240 tablet, Rfl: 0 .  triamcinolone cream (KENALOG) 0.1 %, Apply 1 application topically 3 (three) times daily as needed (rash/irritation). , Disp: ,  Rfl:  .  TRUEplus Lancets 28G MISC, Use as instructed. Check blood glucose level by fingerstick twice per day., Disp: 200 each, Rfl: 3 .  omeprazole (PRILOSEC) 20 MG capsule, Take 1 capsule (20 mg total) by mouth daily. (Patient not taking: Reported on 05/20/2019), Disp: 30 capsule, Rfl: 3 .  pantoprazole (PROTONIX) 40 MG tablet, Take 1 tablet (40 mg total) by mouth daily. (Patient not taking: Reported on 05/13/2019), Disp: 60 tablet, Rfl: 0 .  polyethylene glycol (MIRALAX / GLYCOLAX) 17 g packet, Take 17 g by mouth  daily as needed for severe constipation. (Patient not taking: Reported on 05/13/2019), Disp: 14 each, Rfl: 0 No Known Allergies    Social History   Socioeconomic History  . Marital status: Married    Spouse name: Not on file  . Number of children: Not on file  . Years of education: Not on file  . Highest education level: Not on file  Occupational History  . Not on file  Social Needs  . Financial resource strain: Not on file  . Food insecurity    Worry: Not on file    Inability: Not on file  . Transportation needs    Medical: Not on file    Non-medical: Not on file  Tobacco Use  . Smoking status: Former Research scientist (life sciences)  . Smokeless tobacco: Never Used  Substance and Sexual Activity  . Alcohol use: Not Currently  . Drug use: Not Currently  . Sexual activity: Not Currently  Lifestyle  . Physical activity    Days per week: Not on file    Minutes per session: Not on file  . Stress: Not on file  Relationships  . Social Herbalist on phone: Not on file    Gets together: Not on file    Attends religious service: Not on file    Active member of club or organization: Not on file    Attends meetings of clubs or organizations: Not on file    Relationship status: Not on file  . Intimate partner violence    Fear of current or ex partner: Not on file    Emotionally abused: Not on file    Physically abused: Not on file    Forced sexual activity: Not on file  Other Topics Concern  . Not on file  Social History Narrative  . Not on file    Physical Exam      Future Appointments  Date Time Provider [place]  05/28/2019 10:00 AM [facility] PA/NP MC-[facility] None  06/04/2019  3:30 PM [name], NP [facility]W-[facility] None    BP (!) 134/0   Pulse 86   Temp 97.7 F (36.5 C)   Resp 18   Wt (!) 406 lb (184.2 kg)   SpO2 98%   BMI 56.63 kg/m  CBG EMS-135 Weight yesterday-408 Last visit weight-408  Pt went to sleep study last week, the tech told him he had a bad case of  apnea, he was told he needed 02 with it as well.  He is still reporting blood in stool, the past few times he used BM it was dark blood then it turns bright red.  It looks like hospitalists doctor was on the chart that ordered the xarelto, attempted to call clinic but no answer about how to address this further.  During those times he has been getting dizzy, he also reports when he passes gas BRB comes out and sometimes when he stands up it comes out  too and he feels dizzy and weak too.  Pt breathing doing same.  meds verified and pill box refilled. He did miss one day of meds--think he got thrown off by the sleep study.  Ordered ferrous sulfate for him.  Was able to get the pantoprazole and miralax ordered for him.  Was able to get in contact with clinic and chantel will speak to provider.   [name], [facility] Paramedic  05/20/19

## 2019-05-21 ENCOUNTER — Telehealth: Payer: Self-pay | Admitting: Pulmonary Disease

## 2019-05-21 ENCOUNTER — Other Ambulatory Visit (HOSPITAL_COMMUNITY): Payer: Self-pay

## 2019-05-21 DIAGNOSIS — U071 COVID-19: Secondary | ICD-10-CM | POA: Diagnosis not present

## 2019-05-21 DIAGNOSIS — G4730 Sleep apnea, unspecified: Secondary | ICD-10-CM

## 2019-05-21 HISTORY — PX: HEMORRHOID BANDING: SHX5850

## 2019-05-21 NOTE — Progress Notes (Signed)
I went by [facility] pharmacy to pick up pt meds and then delivered them to him. I placed his omeprazole in his pill box.  Will f/u next Monday.   [name], EMT-Paramedic  05/21/19

## 2019-05-21 NOTE — Telephone Encounter (Signed)
PSG 05/17/19 >> AHI 128.1, SpO2 low 51%.  CPAP 17 cm H2O with 2 liters oxygen.   Please inform him that his sleep study shows very severe obstructive sleep apnea.  Please make sure he has auto CPAP range 10 to 20 cm H2O with heated humidity.  He also needs to be set up with 2 liters supplemental oxygen to be bled into CPAP machine at night.    He needs ROV with me or NP in 6 weeks.

## 2019-05-21 NOTE — Telephone Encounter (Signed)
LMTCB

## 2019-05-21 NOTE — Procedures (Signed)
Patient Name: Benjamin [name], Benjamin [name] Date: 05/17/2019 Gender: Male D.O.B: [birth year] Age (years): 26 Referring Provider: [name] MD, ABSM Height (inches): 71 Interpreting Physician: [name] MD, ABSM Weight (lbs): 403 RPSGT: Baxter Flattery BMI: 56 MRN: [id number] Neck Size: 22.00 <br> <br> CLINICAL INFORMATION Sleep Study Type: Split Night CPAP    Indication for sleep study: Diabetes, Excessive Daytime Sleepiness, Obesity, OSA, Snoring, Witnessed Apneas    Epworth Sleepiness Score: 23  SLEEP STUDY TECHNIQUE As per the AASM Manual for the Scoring of Sleep and Associated Events v2.3 (April 2016) with a hypopnea requiring 4% desaturations.  The channels recorded and monitored were frontal, central and occipital EEG, electrooculogram (EOG), submentalis EMG (chin), nasal and oral airflow, thoracic and abdominal wall motion, anterior tibialis EMG, snore microphone, electrocardiogram, and pulse oximetry. Continuous positive airway pressure (CPAP) was initiated when the patient met split night criteria and was titrated according to treat sleep-disordered breathing.  MEDICATIONS Medications self-administered by patient taken the night of the study : N/A  RESPIRATORY PARAMETERS Diagnostic  Total AHI (/hr): 128.1 RDI (/hr): 128.1 OA Index (/hr): 118.4 CA Index (/hr): 0.0 REM AHI (/hr): N/A NREM AHI (/hr): 128.1 Supine AHI (/hr): 128.1 Non-supine AHI (/hr): 0 Min O2 Sat (%): 51.0 Mean O2 (%): 81.6 Time below 88% (min): 116.6   Titration  Optimal Pressure (cm): 17 AHI at Optimal Pressure (/hr): 0.0 Min O2 at Optimal Pressure (%): 88.0 Supine % at Optimal (%): 100 Sleep % at Optimal (%): 100    He had oxygen desaturation below 88% in spite of having good control of obstructive respiratory events with CPAP.  These lasted more than 5 minutes.  He had 2 liters supplemental oxygen added to CPAP with improvement in oxygenation. SLEEP ARCHITECTURE The recording time for the  entire night was 386.6 minutes.  During a baseline period of 165.5 minutes, the patient slept for 148.0 minutes in REM and nonREM, yielding a sleep efficiency of 89.4%%. Sleep onset after lights out was 0.3 minutes with a REM latency of N/A minutes. The patient spent 3.0%% of the night in stage N1 sleep, 97.0%% in stage N2 sleep, 0.0%% in stage N3 and 0% in REM.    During the titration period of 212.0 minutes, the patient slept for 201.8 minutes in REM and nonREM, yielding a sleep efficiency of 95.2%%. Sleep onset after CPAP initiation was 6.2 minutes with a REM latency of 36.0 minutes. The patient spent 1.2%% of the night in stage N1 sleep, 58.1%% in stage N2 sleep, 0.0%% in stage N3 and 40.6% in REM.  CARDIAC DATA The 2 lead EKG demonstrated sinus rhythm. The mean heart rate was 100.0 beats per minute. Other EKG findings include: None. LEG MOVEMENT DATA The total Periodic Limb Movements of Sleep (PLMS) were 0. The PLMS index was 0.0 .  IMPRESSIONS - Severe obstructive sleep apnea with AHI of 128.1 and SpO2 low of 51%. - He did well with CPAP 17 cm H2O. - He required addition of 2 liters oxygen with CPAP 17 cm H2O. DIAGNOSIS - Obstructive Sleep Apnea  - Obesity Hypoventilation Syndrome - Sleep Related Hypoxemia RECOMMENDATIONS - Trial of CPAP therapy on 17 cm H2O with 2 liters supplemental oxygen.   - Alternative is to use auto CPAP range 10 to 20 cm H2O with 2 liters supplemental oxygen. - He was fitted with a Large size [employer]ips Respironics Full Face Mask Amara View mask and heated humidification. - Weight management and regular exercise should be initiated or continued.  [  Electronically signed] 05/21/2019 04:31 PM  [name] MD, [place], [place] Board of Sleep Medicine   NPI: [id number]

## 2019-05-22 NOTE — Telephone Encounter (Signed)
Attempted to return call to patient. Line busy and unable to leave message.

## 2019-05-22 NOTE — Telephone Encounter (Signed)
Pt returning call for results and can be reached @ [phone].Benjamin [name]

## 2019-05-22 NOTE — Telephone Encounter (Signed)
Attempted to call patient, patient was not available, Left message for patient to call back.

## 2019-05-22 NOTE — Telephone Encounter (Signed)
Called patient twice, received the busy tone each time. If patient calls back, please let someone in triage know.

## 2019-05-22 NOTE — Telephone Encounter (Signed)
Pt is returning call. Cb is [phone].

## 2019-05-22 NOTE — Telephone Encounter (Signed)
Patient is returning phone call.  Patient phone number is [phone].

## 2019-05-23 NOTE — Telephone Encounter (Signed)
It appears the CPAP was ordered by the heart failure clinic. The order was sent to [facility]. Will need to call [employer] in the morning to verify this.

## 2019-05-23 NOTE — Telephone Encounter (Signed)
[name] from [facility].  [name] states has the CPAP information.  [name] phone number is [phone].

## 2019-05-23 NOTE — Telephone Encounter (Signed)
Pt returned call. Informed him of the results and recs per VS. Pt states he already received a CPAP about 4 days ago but doesn't remember where from. Pt states he will call us back when he finds the information from where he got his CPAP. Pt doesn't have O2 so will need to be sure to place the order for O2 and be sure he is on the correct pressure. There is not an order in the pts chart reflecting a recent CPAP order. Appt made with VS for 07/04/2019 for 6 week f/u after CPAP. Will await call back from pt.

## 2019-05-24 ENCOUNTER — Telehealth (HOSPITAL_COMMUNITY): Payer: Self-pay | Admitting: Licensed Clinical Social Worker

## 2019-05-24 ENCOUNTER — Telehealth (HOSPITAL_COMMUNITY): Payer: Self-pay | Admitting: *Deleted

## 2019-05-24 NOTE — Telephone Encounter (Signed)
Previous goals: LONG TERM:  - Weight loss (300 lbs)  - "Get my breath back"  - "Get my life back, get on my own again"   SHORT TERM: - Walk the ramp 4 times (2 days)-  - Walk upstairs 5 times (2 days)  - Journal/document daily  o Rate leg burning "crying out" 10/10 with stair climbing  o Feelings with diet and exercise regimen  o Achieved? (if no, and why not and how do you feel about not achieving?)   - Continue with diet improvement-receiving guidance elsewhere in this area - Weigh every day  - No sodas allowed in living facility  Progress notes:   Mr. Mikita was able to walk the ramp 4x-3 days a week and feels very motivated and accomplished. He was very excited he was able to achieve more than planned. He has been unable to walk the stairs 5x a day. He has been successful at completing the stair climbing 4x a day and was actually working on the stairs at the time that I called him. He was short of breath when he came to the phone. He was able to wash dishes for 12 minutes and break for a total of 40 minutes to complete dish washing. Mr. Apo stated he was looking forward to the phone call today and was not going to wait for the call to complete the goal- he wanted to complete with his own initiative.   He noticed he has lost weight and is less winded than normal. Since he has been walking, he stated he also noticed he is working the leg muscles and has some soreness.  Mr. Villalon snuck in a [place] and was caught from his staff members, he was counseled and stated he felt very embarrassed that he lapsed into a behavior he was forbidden. He feels reset to do better next time.   He stated he has not felt embarrassed in the last week, he feels proud of himself. His social workers and counselors in the living facility are following him closely and serve as his support groups.   This motivates him and gives him purpose.   Patient started to talk about his mom- and her support for him.  He stated she is older and less able but she is a good moral support for him.   Achievements:  - He has felt less embarrassed  - He has lost more weight (400.5 lbs today) - He is happy he is now exercising in 2 different ways  - He is initiating on his own throughout the week  - His stomach aches has subsided a bit  - His sleeping feels better when he exercises - His breathing and shortness of breath has improved    Frustrations:  - He wants soda - His legs burn and hurt from the walking - His back starts to ache from stair climbing - His CPAP concerns--- being followed clinically currently    Goals:  - Lose weight and improve breathing (330 lbs)  - He wants to walk around the block in 2 months (November). - Continue walking ramp for 4x- 3 days  - Continue walking stairs for 5x- 2 days    Mr. Denk confirmed this intervention of health coaching with exercise recommendations and follow-up are realistic for him and are helping him to achieve behavior changes. Patient requested another call next week as he feels very motivated by an accountability partner. CEP will follow next week with a telephone encounter. All  agreeable with plan.     Lesia Hausen, MS, ACSM-RCEP Clinical Exercise Physiologist

## 2019-05-24 NOTE — Telephone Encounter (Signed)
CSW received call from [facility] respiratory therapist, Randall Hiss, to discuss patient recommended CPAP settings following sleep study.  CSW informed pt of pulmonologist recommendations and Randall Hiss stated he could easily make these adjustments when he goes back out to visit the patient on Monday.  Per chart orders were already faxed to [facility] by pulmonologist office.  CSW also contacted patient disability determination worker, Kristeen Miss, and sent in updates to her for review.  CSW called pt to update.  CSW will continue to follow and assist as needed  [name], [facility] Desk#: [phone] Cell#: [phone]

## 2019-05-27 ENCOUNTER — Other Ambulatory Visit (HOSPITAL_COMMUNITY): Payer: Self-pay

## 2019-05-27 NOTE — Progress Notes (Signed)
PCP: Juluis Mire NP Primary HF Cardiologist: Dr Aundra Dubin  GI: Dr Paulita Fujita   HPI: Benjamin [name] is a 53 yo with hx of diastolic CHF, HTN, DM, Atrial fib, PE in April 2020 (on Xarelto), OSA, GERD,chronic elevation of troponin.    Admitted 01/21/2019 with increased dyspnea and had PE. He was placed on back on coumadin .  Patient re-admitted 5/20 for syncope/cough/dyspnea, COVID+, CT positive for bilateral GGOs, admitted to [facility] and discharged after 6 days, on O2. Rec'd Cards f/u.  Patient re-admitted 02/20/19 for bright red blood per rectum, eval'ed by GI, had EGD/colon, dx'd with hemorrhoidal bleeding, discharged after 9 days on O2. He was again Covid + .  Readmitted 03/11/19 with increased shortness of breath. He did not have home oxygen. He was discharged to home the next day.   He presented to [facility] ED 04/11/19 with lower extremity edema . COVID negative. Diuresed with lasix drip and transitioned to torsemide 40 mg twice daily. Had RHC as noted below with mild volume overload and preserved cardiac output. Discharge weight was 407.9 pounds.   Today he returns for HF follow up.Overall feeling fair. Having ongoing BRBPR. SOB with exertion.  Denies PND/Orthopnea. Appetite ok. Had pizza last night. Says he just eats what is provided at the half way house.  No fever or chills. Weight at home 399-->408 pounds. Using CPAP every night. Taking all medications.  Echo 01/2019:EF 60-65%, grade I DD  RHC 04/17/2019  RA mean 12 RV 37/18 PA 40/25, mean 31 PCWP mean 23 Oxygen saturations: PA 64% AO 100% Cardiac Output (Fick) 7.07  Cardiac Index (Fick) 2.47 PVR 1.1 WU  ROS: All systems negative except as listed in HPI, PMH and Problem List.  SH:  Social History   Socioeconomic History  . Marital status: Married    Spouse name: Not on file  . Number of children: Not on file  . Years of education: Not on file  . Highest education level: Not on file  Occupational History  . Not on file  Social  Needs  . Financial resource strain: Not on file  . Food insecurity    Worry: Not on file    Inability: Not on file  . Transportation needs    Medical: Not on file    Non-medical: Not on file  Tobacco Use  . Smoking status: Former Research scientist (life sciences)  . Smokeless tobacco: Never Used  Substance and Sexual Activity  . Alcohol use: Not Currently  . Drug use: Not Currently  . Sexual activity: Not Currently  Lifestyle  . Physical activity    Days per week: Not on file    Minutes per session: Not on file  . Stress: Not on file  Relationships  . Social Herbalist on phone: Not on file    Gets together: Not on file    Attends religious service: Not on file    Active member of club or organization: Not on file    Attends meetings of clubs or organizations: Not on file    Relationship status: Not on file  . Intimate partner violence    Fear of current or ex partner: Not on file    Emotionally abused: Not on file    Physically abused: Not on file    Forced sexual activity: Not on file  Other Topics Concern  . Not on file  Social History Narrative  . Not on file    FH:  Family History  Problem Relation Age of  Onset  . Hypertension Mother   . Diabetes Mother     Past Medical History:  Diagnosis Date  . A-fib ([place])   . Diabetes mellitus without complication ([place])   . Elevated troponin 02/06/2019  . Hypertension   . Obesity   . Pulmonary embolism ([place])   . Sleep apnea     Current Outpatient Medications  Medication Sig Dispense Refill  . albuterol (VENTOLIN HFA) 108 (90 Base) MCG/ACT inhaler Inhale into the lungs every 6 (six) hours as needed for wheezing or shortness of breath.    Benjamin [name] Kitchen amLODipine (NORVASC) 10 MG tablet Take 1 tablet (10 mg total) by mouth daily. 60 tablet 0  . atorvastatin (LIPITOR) 20 MG tablet Take 1 tablet (20 mg total) by mouth at bedtime. 60 tablet 0  . docusate sodium (COLACE) 100 MG capsule Take 1 capsule (100 mg total) by mouth 2 (two) times daily. 60  capsule 2  . ferrous sulfate 325 (65 FE) MG tablet Take 1 tablet (325 mg total) by mouth daily. Patient will pick up scripts today. 90 tablet 0  . labetalol (NORMODYNE) 200 MG tablet Take 1 tablet (200 mg total) by mouth 2 (two) times daily. 120 tablet 0  . levalbuterol (XOPENEX HFA) 45 MCG/ACT inhaler Inhale into the lungs every 4 (four) hours as needed for wheezing.    . metFORMIN (GLUCOPHAGE) 500 MG tablet Take 1 tablet (500 mg total) by mouth 2 (two) times a day. Patient will pick up scripts today. (Patient taking differently: Take 500 mg by mouth 2 (two) times daily with a meal. Patient will pick up scripts today.) 180 tablet 0  . omeprazole (PRILOSEC) 20 MG capsule Take 1 capsule (20 mg total) by mouth daily. 30 capsule 3  . potassium chloride (K-DUR) 10 MEQ tablet Take 2 tablets (20 mEq total) by mouth daily. 60 tablet 0  . rivaroxaban (XARELTO) 20 MG TABS tablet Take 1 tablet (20 mg total) by mouth daily with supper. 60 tablet 0  . torsemide (DEMADEX) 20 MG tablet Take 2 tablets (40 mg total) by mouth 2 (two) times daily. 240 tablet 0  . Blood Glucose Monitoring Suppl (TRUE METRIX METER) w/Device KIT Use as instructed. Check blood glucose level by fingerstick twice per day. 1 kit 0  . carboxymethylcellulose (REFRESH PLUS) 0.5 % SOLN Place 1 drop into both eyes daily.     Benjamin [name] Kitchen glucose blood (TRUE METRIX BLOOD GLUCOSE TEST) test strip Use as instructed. Check blood glucose level by fingerstick twice per day. 100 each 12  . pantoprazole (PROTONIX) 40 MG tablet Take 1 tablet (40 mg total) by mouth daily. (Patient not taking: Reported on 05/13/2019) 60 tablet 0  . polyethylene glycol (MIRALAX / GLYCOLAX) 17 g packet Take 17 g by mouth daily as needed for severe constipation. 14 each 0  . senna-docusate (SENOKOT-S) 8.6-50 MG tablet Take 2 tablets by mouth daily. (Patient not taking: Reported on 05/27/2019) 60 tablet 3  . triamcinolone cream (KENALOG) 0.1 % Apply 1 application topically 3 (three) times  daily as needed (rash/irritation).     . TRUEplus Lancets 28G MISC Use as instructed. Check blood glucose level by fingerstick twice per day. 200 each 3   No current facility-administered medications for this encounter.     Vitals:   05/28/19 1034  BP: (!) 164/92  Pulse: (!) 108  SpO2: 94%  Weight: (!) 185.2 kg (408 lb 3.2 oz)   Wt Readings from Last 3 Encounters:  05/28/19 (!) 185.2 kg (408 lb  3.2 oz)  05/27/19 (!) 181 kg (399 lb)  05/20/19 (!) 184.2 kg (406 lb)    PHYSICAL EXAM: General:  Appears chronically ill.  No resp difficulty HEENT: normal Neck: supple. JVP hard to assess due to body habitus. Carotids 2+ bilat; no bruits. No lymphadenopathy or thryomegaly appreciated. Cor: PMI nondisplaced. Regular rate & rhythm. No rubs, gallops or murmurs. Lungs: clear Abdomen: obese, soft, nontender, nondistended. No hepatosplenomegaly. No bruits or masses. Good bowel sounds. Extremities: no cyanosis, clubbing, rash, R and LLE 2-3+ edema Neuro: alert & orientedx3, cranial nerves grossly intact. moves all 4 extremities w/o difficulty. Affect pleasant . EKG: NSR 89 bpm   ASSESSMENT & PLAN: 1. Chronic diastolic CHF: Echo in 3/52 showed EF 60-65% but RV was poorly visualized. He was initially diuresed with Lasix gtt 10 mg/hr and weight dropped 10 lbs. However, creatinine increased from 1.24 =>2.38.  I suspect significant RV dysfunction in setting of OSA, unfortunately unable to see the RV well on last echo. Gleason on 7/29 showed elevated left and right heart filling pressures but not marked elevation.   NYHA IIIb. Volume status elevated in the setting of high sodium diet. Discussed low salt food choices and to limit fluid intake to < 2 liters per day.  Volume status elevated. Increase torsemide to 80 mg twice a day.  - Check BMEt and BNP today.     2. CKD Stage II.  Check BMET  3. Atrial fibrillation: Paroxysmal.   - Continue Xarelto.  - Check CBC today with BRBPR.   4. OSA: Continue  CPAP nightly   5. Obesity: Body mass index is 56.93 kg/m.  Needs to lose significant amount of weight. Discussed portion control.   6. H/o PE: 5/20.  Continue Xarelto.   7. HTN: Elevated  8. Hematochezia  Needs f/u with Dr Paulita Fujita. Had scope in June with internal hemorrhoids. He never followed up due to hospitalization and he is again having BRBPR. Check CBC today. Continue stool softener. Add anusol HC as needed per GI recommendations after scope.  Follow up next week to reassess volume status.  Discussed with HF Paramedicine.    NP-C  10:46 AM

## 2019-05-27 NOTE — Progress Notes (Signed)
Paramedicine Encounter    Patient ID: KATHERINE SYME, male    DOB: August 31, 1966, 53 y.o.   MRN: [id number]   Patient Care Team: [name], NP as PCP - General (Nurse Practitioner) [name], MD as PCP - Cardiology (Cardiology) [name], LCSW as Social Worker (Licensed Clinical Social Worker)  Patient Active Problem List   Diagnosis Date Noted  . Acute on chronic diastolic CHF (congestive heart failure) ([place]) 04/11/2019  . Iron deficiency anemia 03/11/2019  . OSA on CPAP 03/11/2019  . HLD (hyperlipidemia) 03/11/2019  . Elevated troponin 03/11/2019  . GERD (gastroesophageal reflux disease) 03/11/2019  . Rectal bleeding 02/20/2019  . Dyspnea 02/06/2019  . COVID-19 virus infection 02/06/2019  . Bilateral lower extremity edema   . Obesity, Class III, BMI 40-49.9 (morbid obesity) ([place])   . Chronic diastolic CHF (congestive heart failure) ([place])   . PAF (paroxysmal atrial fibrillation) ([place])   . PE (pulmonary thromboembolism) ([place]) 01/21/2019  . Hypertensive urgency 01/21/2019  . Diabetes mellitus type 2 in obese ([place]) 01/21/2019    Current Outpatient Medications:  .  albuterol (VENTOLIN HFA) 108 (90 Base) MCG/ACT inhaler, Inhale into the lungs every 6 (six) hours as needed for wheezing or shortness of breath., Disp: , Rfl:  .  amLODipine (NORVASC) 10 MG tablet, Take 1 tablet (10 mg total) by mouth daily., Disp: 60 tablet, Rfl: 0 .  atorvastatin (LIPITOR) 20 MG tablet, Take 1 tablet (20 mg total) by mouth at bedtime., Disp: 60 tablet, Rfl: 0 .  Blood Glucose Monitoring Suppl (TRUE METRIX METER) w/Device KIT, Use as instructed. Check blood glucose level by fingerstick twice per day., Disp: 1 kit, Rfl: 0 .  carboxymethylcellulose (REFRESH PLUS) 0.5 % SOLN, Place 1 drop into both eyes daily. , Disp: , Rfl:  .  docusate sodium (COLACE) 100 MG capsule, Take 1 capsule (100 mg total) by mouth 2 (two) times daily., Disp: 60 capsule, Rfl: 2 .  ferrous sulfate 325 (65 FE) MG  tablet, Take 1 tablet (325 mg total) by mouth daily. Patient will pick up scripts today., Disp: 90 tablet, Rfl: 0 .  glucose blood (TRUE METRIX BLOOD GLUCOSE TEST) test strip, Use as instructed. Check blood glucose level by fingerstick twice per day., Disp: 100 each, Rfl: 12 .  labetalol (NORMODYNE) 200 MG tablet, Take 1 tablet (200 mg total) by mouth 2 (two) times daily., Disp: 120 tablet, Rfl: 0 .  levalbuterol (XOPENEX HFA) 45 MCG/ACT inhaler, Inhale into the lungs every 4 (four) hours as needed for wheezing., Disp: , Rfl:  .  metFORMIN (GLUCOPHAGE) 500 MG tablet, Take 1 tablet (500 mg total) by mouth 2 (two) times a day. Patient will pick up scripts today. (Patient taking differently: Take 500 mg by mouth 2 (two) times daily with a meal. Patient will pick up scripts today.), Disp: 180 tablet, Rfl: 0 .  omeprazole (PRILOSEC) 20 MG capsule, Take 1 capsule (20 mg total) by mouth daily., Disp: 30 capsule, Rfl: 3 .  polyethylene glycol (MIRALAX / GLYCOLAX) 17 g packet, Take 17 g by mouth daily as needed for severe constipation., Disp: 14 each, Rfl: 0 .  potassium chloride (K-DUR) 10 MEQ tablet, Take 2 tablets (20 mEq total) by mouth daily., Disp: 60 tablet, Rfl: 0 .  rivaroxaban (XARELTO) 20 MG TABS tablet, Take 1 tablet (20 mg total) by mouth daily with supper., Disp: 60 tablet, Rfl: 0 .  torsemide (DEMADEX) 20 MG tablet, Take 2 tablets (40 mg total) by mouth  2 (two) times daily., Disp: 240 tablet, Rfl: 0 .  triamcinolone cream (KENALOG) 0.1 %, Apply 1 application topically 3 (three) times daily as needed (rash/irritation). , Disp: , Rfl:  .  TRUEplus Lancets 28G MISC, Use as instructed. Check blood glucose level by fingerstick twice per day., Disp: 200 each, Rfl: 3 .  pantoprazole (PROTONIX) 40 MG tablet, Take 1 tablet (40 mg total) by mouth daily. (Patient not taking: Reported on 05/13/2019), Disp: 60 tablet, Rfl: 0 .  senna-docusate (SENOKOT-S) 8.6-50 MG tablet, Take 2 tablets by mouth daily. (Patient  not taking: Reported on 05/27/2019), Disp: 60 tablet, Rfl: 3 No Known Allergies    Social History   Socioeconomic History  . Marital status: Married    Spouse name: Not on file  . Number of children: Not on file  . Years of education: Not on file  . Highest education level: Not on file  Occupational History  . Not on file  Social Needs  . Financial resource strain: Not on file  . Food insecurity    Worry: Not on file    Inability: Not on file  . Transportation needs    Medical: Not on file    Non-medical: Not on file  Tobacco Use  . Smoking status: Former Research scientist (life sciences)  . Smokeless tobacco: Never Used  Substance and Sexual Activity  . Alcohol use: Not Currently  . Drug use: Not Currently  . Sexual activity: Not Currently  Lifestyle  . Physical activity    Days per week: Not on file    Minutes per session: Not on file  . Stress: Not on file  Relationships  . Social Herbalist on phone: Not on file    Gets together: Not on file    Attends religious service: Not on file    Active member of club or organization: Not on file    Attends meetings of clubs or organizations: Not on file    Relationship status: Not on file  . Intimate partner violence    Fear of current or ex partner: Not on file    Emotionally abused: Not on file    Physically abused: Not on file    Forced sexual activity: Not on file  Other Topics Concern  . Not on file  Social History Narrative  . Not on file    Physical Exam      Future Appointments  Date Time Provider [place]  05/28/2019 10:00 AM [facility] PA/NP [facility] None  06/04/2019  3:30 PM [name], NP [facility] None  07/04/2019 11:45 AM [name], MD [facility] None    BP (!) 150/0   Pulse (!) 102   Temp 97.8 F (36.6 C)   Resp 20   Wt (!) 399 lb (181 kg)   SpO2 98%   BMI 55.65 kg/m  B/p stanfding--160/systolic Weight [id number] Last visit weight-406  Pt reports he is not feeling good right now, he  had just finished having BM and he reports it has been very bloody, he states it can be all different colors of the blood from black to bright red. He had rapid breathing and was sweaty. He denies c/p, his b/p was elevated and no orthostatic. He did report dizziness when he was having BM.  V/s as noted, he did take his meds this morning. B/p increased some when standing.  He states after he eats he has a bubbly stomach and then feels pressure and has to have BM.  He states he will go to ER tomor if no improvement b/c his son from [place] and haven't seen him in 6 years. I offered him if he wanted to go now he could but again he states he is not going anywhere and miss his son.   He states he has been using his CPAP. He states he loves that and he is using his 02 more as well.  He states he has not been urinating as much.   He needs colace, metformin, potassium.  Colace came from [facility] clinic and needs to be switched to [place].   [name], [facility] Paramedic  05/27/19

## 2019-05-28 ENCOUNTER — Ambulatory Visit (HOSPITAL_BASED_OUTPATIENT_CLINIC_OR_DEPARTMENT_OTHER)
Admission: RE | Admit: 2019-05-28 | Discharge: 2019-05-28 | Disposition: A | Discharge status: Home / Self Care | Payer: Medicaid Other | Source: Ambulatory Visit | Attending: Internal Medicine | Admitting: Internal Medicine

## 2019-05-28 ENCOUNTER — Encounter (HOSPITAL_COMMUNITY): Payer: Self-pay | Admitting: Internal Medicine

## 2019-05-28 ENCOUNTER — Inpatient Hospital Stay (HOSPITAL_COMMUNITY)
Admission: EM | Admit: 2019-05-28 | Discharge: 2019-05-30 | DRG: 394 | Disposition: A | Discharge status: Home / Self Care | Payer: Medicaid Other | Attending: Internal Medicine | Admitting: Internal Medicine

## 2019-05-28 ENCOUNTER — Other Ambulatory Visit (HOSPITAL_COMMUNITY): Payer: Self-pay

## 2019-05-28 ENCOUNTER — Telehealth (HOSPITAL_COMMUNITY): Payer: Self-pay | Admitting: Licensed Clinical Social Worker

## 2019-05-28 ENCOUNTER — Other Ambulatory Visit: Payer: Self-pay

## 2019-05-28 VITALS — BP 164/92 | HR 108 | Wt >= 6400 oz

## 2019-05-28 DIAGNOSIS — E1122 Type 2 diabetes mellitus with diabetic chronic kidney disease: Secondary | ICD-10-CM | POA: Insufficient documentation

## 2019-05-28 DIAGNOSIS — K922 Gastrointestinal hemorrhage, unspecified: Secondary | ICD-10-CM | POA: Diagnosis not present

## 2019-05-28 DIAGNOSIS — Z86711 Personal history of pulmonary embolism: Secondary | ICD-10-CM

## 2019-05-28 DIAGNOSIS — E669 Obesity, unspecified: Secondary | ICD-10-CM | POA: Diagnosis present

## 2019-05-28 DIAGNOSIS — E785 Hyperlipidemia, unspecified: Secondary | ICD-10-CM | POA: Diagnosis present

## 2019-05-28 DIAGNOSIS — K648 Other hemorrhoids: Principal | ICD-10-CM | POA: Diagnosis present

## 2019-05-28 DIAGNOSIS — K921 Melena: Secondary | ICD-10-CM | POA: Insufficient documentation

## 2019-05-28 DIAGNOSIS — E119 Type 2 diabetes mellitus without complications: Secondary | ICD-10-CM | POA: Diagnosis present

## 2019-05-28 DIAGNOSIS — Z20828 Contact with and (suspected) exposure to other viral communicable diseases: Secondary | ICD-10-CM | POA: Diagnosis present

## 2019-05-28 DIAGNOSIS — Z833 Family history of diabetes mellitus: Secondary | ICD-10-CM

## 2019-05-28 DIAGNOSIS — Z8249 Family history of ischemic heart disease and other diseases of the circulatory system: Secondary | ICD-10-CM | POA: Insufficient documentation

## 2019-05-28 DIAGNOSIS — R1032 Left lower quadrant pain: Secondary | ICD-10-CM | POA: Diagnosis present

## 2019-05-28 DIAGNOSIS — Z87891 Personal history of nicotine dependence: Secondary | ICD-10-CM | POA: Insufficient documentation

## 2019-05-28 DIAGNOSIS — G4733 Obstructive sleep apnea (adult) (pediatric): Secondary | ICD-10-CM | POA: Diagnosis present

## 2019-05-28 DIAGNOSIS — I480 Paroxysmal atrial fibrillation: Secondary | ICD-10-CM | POA: Insufficient documentation

## 2019-05-28 DIAGNOSIS — I2699 Other pulmonary embolism without acute cor pulmonale: Secondary | ICD-10-CM

## 2019-05-28 DIAGNOSIS — I130 Hypertensive heart and chronic kidney disease with heart failure and stage 1 through stage 4 chronic kidney disease, or unspecified chronic kidney disease: Secondary | ICD-10-CM | POA: Insufficient documentation

## 2019-05-28 DIAGNOSIS — I110 Hypertensive heart disease with heart failure: Secondary | ICD-10-CM | POA: Diagnosis present

## 2019-05-28 DIAGNOSIS — Z7984 Long term (current) use of oral hypoglycemic drugs: Secondary | ICD-10-CM | POA: Insufficient documentation

## 2019-05-28 DIAGNOSIS — K644 Residual hemorrhoidal skin tags: Secondary | ICD-10-CM | POA: Diagnosis not present

## 2019-05-28 DIAGNOSIS — Z8719 Personal history of other diseases of the digestive system: Secondary | ICD-10-CM | POA: Insufficient documentation

## 2019-05-28 DIAGNOSIS — D62 Acute posthemorrhagic anemia: Secondary | ICD-10-CM | POA: Diagnosis present

## 2019-05-28 DIAGNOSIS — Z79899 Other long term (current) drug therapy: Secondary | ICD-10-CM

## 2019-05-28 DIAGNOSIS — I5032 Chronic diastolic (congestive) heart failure: Secondary | ICD-10-CM

## 2019-05-28 DIAGNOSIS — K5900 Constipation, unspecified: Secondary | ICD-10-CM | POA: Diagnosis present

## 2019-05-28 DIAGNOSIS — K625 Hemorrhage of anus and rectum: Secondary | ICD-10-CM | POA: Diagnosis present

## 2019-05-28 DIAGNOSIS — Z8619 Personal history of other infectious and parasitic diseases: Secondary | ICD-10-CM

## 2019-05-28 DIAGNOSIS — R42 Dizziness and giddiness: Secondary | ICD-10-CM | POA: Diagnosis not present

## 2019-05-28 DIAGNOSIS — R58 Hemorrhage, not elsewhere classified: Secondary | ICD-10-CM | POA: Diagnosis not present

## 2019-05-28 DIAGNOSIS — Z6841 Body Mass Index (BMI) 40.0 and over, adult: Secondary | ICD-10-CM

## 2019-05-28 DIAGNOSIS — Z7901 Long term (current) use of anticoagulants: Secondary | ICD-10-CM

## 2019-05-28 DIAGNOSIS — R0902 Hypoxemia: Secondary | ICD-10-CM | POA: Diagnosis not present

## 2019-05-28 DIAGNOSIS — K219 Gastro-esophageal reflux disease without esophagitis: Secondary | ICD-10-CM | POA: Diagnosis not present

## 2019-05-28 DIAGNOSIS — N182 Chronic kidney disease, stage 2 (mild): Secondary | ICD-10-CM | POA: Insufficient documentation

## 2019-05-28 DIAGNOSIS — U071 COVID-19: Secondary | ICD-10-CM | POA: Diagnosis not present

## 2019-05-28 DIAGNOSIS — E876 Hypokalemia: Secondary | ICD-10-CM | POA: Diagnosis present

## 2019-05-28 DIAGNOSIS — R9431 Abnormal electrocardiogram [ECG] [EKG]: Secondary | ICD-10-CM | POA: Insufficient documentation

## 2019-05-28 DIAGNOSIS — D649 Anemia, unspecified: Secondary | ICD-10-CM | POA: Diagnosis present

## 2019-05-28 DIAGNOSIS — E1169 Type 2 diabetes mellitus with other specified complication: Secondary | ICD-10-CM | POA: Diagnosis present

## 2019-05-28 DIAGNOSIS — Z9989 Dependence on other enabling machines and devices: Secondary | ICD-10-CM

## 2019-05-28 DIAGNOSIS — I10 Essential (primary) hypertension: Secondary | ICD-10-CM

## 2019-05-28 HISTORY — DX: Heart failure, unspecified: I50.9

## 2019-05-28 LAB — BASIC METABOLIC PANEL
Anion gap: 8 (ref 5–15)
BUN: 12 mg/dL (ref 6–20)
CO2: 27 mmol/L (ref 22–32)
Calcium: 8.5 mg/dL — ABNORMAL LOW (ref 8.9–10.3)
Chloride: 103 mmol/L (ref 98–111)
Creatinine, Ser: 1.34 mg/dL — ABNORMAL HIGH (ref 0.61–1.24)
GFR calc Af Amer: >60 mL/min (ref >60)
GFR calc non Af Amer: >60 mL/min (ref >60)
Glucose, Bld: 105 mg/dL — ABNORMAL HIGH (ref 70–99)
Potassium: 3.4 mmol/L — ABNORMAL LOW (ref 3.5–5.1)
Sodium: 138 mmol/L (ref 135–145)

## 2019-05-28 LAB — CBC
HCT: 24.4 % — ABNORMAL LOW (ref 39.0–52.0)
HCT: 25.3 % — ABNORMAL LOW (ref 39.0–52.0)
Hemoglobin: 7.1 g/dL — ABNORMAL LOW (ref 13.0–17.0)
Hemoglobin: 7.6 g/dL — ABNORMAL LOW (ref 13.0–17.0)
MCH: 22.7 pg — ABNORMAL LOW (ref 26.0–34.0)
MCH: 23.2 pg — ABNORMAL LOW (ref 26.0–34.0)
MCHC: 29.1 g/dL — ABNORMAL LOW (ref 30.0–36.0)
MCHC: 30 g/dL (ref 30.0–36.0)
MCV: 78 fL — ABNORMAL LOW (ref 80.0–100.0)
MCV: 77.4 fL — ABNORMAL LOW (ref 80.0–100.0)
Platelets: 418 10*3/uL — ABNORMAL HIGH (ref 150–400)
Platelets: 425 10*3/uL — ABNORMAL HIGH (ref 150–400)
RBC: 3.13 MIL/uL — ABNORMAL LOW (ref 4.22–5.81)
RBC: 3.27 MIL/uL — ABNORMAL LOW (ref 4.22–5.81)
RDW: 15.9 % — ABNORMAL HIGH (ref 11.5–15.5)
RDW: 16.5 % — ABNORMAL HIGH (ref 11.5–15.5)
WBC: 8.6 10*3/uL (ref 4.0–10.5)
WBC: 10.2 10*3/uL (ref 4.0–10.5)
nRBC: 0 % (ref 0.0–0.2)
nRBC: 0.2 % (ref 0.0–0.2)

## 2019-05-28 LAB — GLUCOSE, CAPILLARY: Glucose-Capillary: 122 mg/dL — ABNORMAL HIGH (ref 70–99)

## 2019-05-28 LAB — PREPARE RBC (CROSSMATCH)

## 2019-05-28 LAB — SARS CORONAVIRUS 2 BY RT PCR (HOSPITAL ORDER, PERFORMED IN ~~LOC~~ HOSPITAL LAB): SARS Coronavirus 2: NEGATIVE

## 2019-05-28 LAB — BRAIN NATRIURETIC PEPTIDE: B Natriuretic Peptide: 25.1 pg/mL (ref 0.0–100.0)

## 2019-05-28 LAB — POC OCCULT BLOOD, ED: Fecal Occult Bld: POSITIVE — AB

## 2019-05-28 MED ORDER — DOCUSATE SODIUM 100 MG PO CAPS
100.0000 mg | ORAL_CAPSULE | Freq: Two times a day (BID) | ORAL | Status: DC
  Administered 2019-05-28 – 2019-05-29 (×2): 100 mg via ORAL
  Filled 2019-05-28 (×2): qty 1

## 2019-05-28 MED ORDER — TORSEMIDE 20 MG PO TABS
80.0000 mg | ORAL_TABLET | Freq: Two times a day (BID) | ORAL | Status: DC
  Administered 2019-05-29 – 2019-05-30 (×3): 80 mg via ORAL
  Filled 2019-05-28 (×4): qty 4

## 2019-05-28 MED ORDER — INSULIN ASPART 100 UNIT/ML ~~LOC~~ SOLN
0.0000 [IU] | SUBCUTANEOUS | Status: DC
  Administered 2019-05-28 – 2019-05-30 (×3): 1 [IU] via SUBCUTANEOUS

## 2019-05-28 MED ORDER — SENNOSIDES-DOCUSATE SODIUM 8.6-50 MG PO TABS
2.0000 | ORAL_TABLET | Freq: Every day | ORAL | Status: DC
  Administered 2019-05-30: 10:00:00 2 via ORAL
  Filled 2019-05-28: qty 2

## 2019-05-28 MED ORDER — POLYETHYLENE GLYCOL 3350 17 G PO PACK: 17.0000 g | PACK | Freq: Every day | ORAL | Status: DC | PRN

## 2019-05-28 MED ORDER — HYDROCORTISONE ACETATE 25 MG RE SUPP: 25.0000 mg | Freq: Two times a day (BID) | RECTAL | 0 refills | Status: DC | PRN

## 2019-05-28 MED ORDER — TORSEMIDE 20 MG PO TABS: 80.0000 mg | ORAL_TABLET | Freq: Two times a day (BID) | ORAL | 6 refills | Status: DC

## 2019-05-28 MED ORDER — SODIUM CHLORIDE 0.9% IV SOLUTION
Freq: Once | INTRAVENOUS | Status: AC
  Administered 2019-05-28: 18:00:00 via INTRAVENOUS

## 2019-05-28 MED ORDER — ATORVASTATIN CALCIUM 10 MG PO TABS
20.0000 mg | ORAL_TABLET | Freq: Every day | ORAL | Status: DC
  Administered 2019-05-28 – 2019-05-29 (×2): 20 mg via ORAL
  Filled 2019-05-28 (×2): qty 2

## 2019-05-28 MED ORDER — LABETALOL HCL 100 MG PO TABS
200.0000 mg | ORAL_TABLET | Freq: Two times a day (BID) | ORAL | Status: DC
  Administered 2019-05-28 – 2019-05-30 (×4): 200 mg via ORAL
  Filled 2019-05-28 (×4): qty 2

## 2019-05-28 MED ORDER — HYDROCORTISONE ACETATE 25 MG RE SUPP
25.0000 mg | Freq: Once | RECTAL | Status: DC
  Filled 2019-05-28: qty 1

## 2019-05-28 MED ORDER — PANTOPRAZOLE SODIUM 40 MG IV SOLR
40.0000 mg | Freq: Once | INTRAVENOUS | Status: AC
  Administered 2019-05-28: 40 mg via INTRAVENOUS
  Filled 2019-05-28: qty 40

## 2019-05-28 MED ORDER — ACETAMINOPHEN 650 MG RE SUPP: 650.0000 mg | Freq: Four times a day (QID) | RECTAL | Status: DC | PRN

## 2019-05-28 MED ORDER — ACETAMINOPHEN 325 MG PO TABS: 650.0000 mg | ORAL_TABLET | Freq: Four times a day (QID) | ORAL | Status: DC | PRN

## 2019-05-28 MED ORDER — SODIUM CHLORIDE 0.9% FLUSH: 3.0000 mL | INTRAVENOUS | Status: DC | PRN

## 2019-05-28 MED ORDER — SODIUM CHLORIDE 0.9% FLUSH
3.0000 mL | Freq: Two times a day (BID) | INTRAVENOUS | Status: DC
  Administered 2019-05-29: 3 mL via INTRAVENOUS

## 2019-05-28 MED ORDER — SODIUM CHLORIDE 0.9 % IV SOLN: 250.0000 mL | INTRAVENOUS | Status: DC | PRN

## 2019-05-28 MED ORDER — PANTOPRAZOLE SODIUM 40 MG PO TBEC
40.0000 mg | DELAYED_RELEASE_TABLET | Freq: Every day | ORAL | Status: DC
  Administered 2019-05-29 – 2019-05-30 (×2): 40 mg via ORAL
  Filled 2019-05-28 (×2): qty 1

## 2019-05-28 MED FILL — HYDROCORTISONE ACETATE 25 M: 25 | 6 days supply | Qty: 12 | Fill #0

## 2019-05-28 MED FILL — ?METFORMIN HCL 500MG TABLET: 500 | 30 days supply | Qty: 60 | Fill #1

## 2019-05-28 MED FILL — TORSEMIDE 20 MG TABLET: 20 | 30 days supply | Qty: 240 | Fill #0

## 2019-05-28 NOTE — Telephone Encounter (Signed)
CSW requested to call taxi for patient for today's clinic appointment. CSW available as needed. Raquel Sarna, [place], [place]

## 2019-05-28 NOTE — ED Provider Notes (Signed)
[facility] EMERGENCY DEPARTMENT Provider Note   CSN: [id number] Arrival date & time: 05/28/19  1627     History   Chief Complaint Chief Complaint  Patient presents with  . GI Bleeding    HPI Benjamin [name] is a 53 y.o. male with past medical history of atrial fibrillation, pulmonary embolism on Xarelto, type 2 diabetes, CHF, presenting to the emergency department with complaint of worsening GI bleed.  He states he has been admitted in the past recently for bleeding hemorrhoids and had a scope done by Dr. Paulita Fujita.  He states about a week ago he began having new onset of melena with his bowel movements that he describes as black almost kidney bean appearing stool.  He states any meals caused him to have a large bowel movement where he has a dark black stool everywhere.  He states he has been getting progressively more lightheaded and fatigued.  He states the symptoms are different from his symptoms of CHF which are also bothering him.  He states he cheated on his diet recently and had pizza, chips, and [facility].  He was evaluated at the heart failure clinic today with some adjustments in his torsemide.  He discussed his bleeding and they checked labs.  He states they called him a couple of hours later telling him his hemoglobin was 7 and he needed to report to the emergency department for blood transfusion.  He states this is not happened before.  He does endorse shortness of breath and dyspnea on exertion as well as increased lower extremity edema which he associates with his CHF.     The history is provided by the patient and medical records.    Past Medical History:  Diagnosis Date  . A-fib ([place])   . Diabetes mellitus without complication ([place])   . Elevated troponin 02/06/2019  . Hypertension   . Obesity   . Pulmonary embolism ([place])   . Sleep apnea     Patient Active Problem List   Diagnosis Date Noted  . Anemia 05/28/2019  . Acute on chronic diastolic  CHF (congestive heart failure) ([place]) 04/11/2019  . Iron deficiency anemia 03/11/2019  . OSA on CPAP 03/11/2019  . HLD (hyperlipidemia) 03/11/2019  . Elevated troponin 03/11/2019  . GERD (gastroesophageal reflux disease) 03/11/2019  . Rectal bleeding 02/20/2019  . Dyspnea 02/06/2019  . COVID-19 virus infection 02/06/2019  . Bilateral lower extremity edema   . Obesity, Class III, BMI 40-49.9 (morbid obesity) ([name])   . Chronic diastolic CHF (congestive heart failure) ([place])   . PAF (paroxysmal atrial fibrillation) ([place])   . PE (pulmonary thromboembolism) ([place]) 01/21/2019  . Hypertensive urgency 01/21/2019  . Diabetes mellitus type 2 in obese ([place]) 01/21/2019    Past Surgical History:  Procedure Laterality Date  . COLONOSCOPY WITH PROPOFOL Left 02/25/2019   Procedure: COLONOSCOPY WITH PROPOFOL;  Surgeon: Arta Silence, MD;  Location: [facility];  Service: Endoscopy;  Laterality: Left;  . ESOPHAGOGASTRODUODENOSCOPY (EGD) WITH PROPOFOL Left 02/25/2019   Procedure: ESOPHAGOGASTRODUODENOSCOPY (EGD) WITH PROPOFOL;  Surgeon: Arta Silence, MD;  Location: [facility] ENDOSCOPY;  Service: Endoscopy;  Laterality: Left;  . NO PAST SURGERIES    . RIGHT HEART CATH N/A 04/17/2019   Procedure: RIGHT HEART CATH;  Surgeon: Larey Dresser, MD;  Location: [facility] LAB;  Service: Cardiovascular;  Laterality: N/A;        Home Medications    Prior to Admission medications   Medication Sig Start Date End Date Taking?  Authorizing Provider  albuterol (VENTOLIN HFA) 108 (90 Base) MCG/ACT inhaler Inhale 2 puffs into the lungs every 6 (six) hours as needed for wheezing or shortness of breath.    Yes [provider]  amLODipine (NORVASC) 10 MG tablet Take 1 tablet (10 mg total) by mouth daily. 04/20/19  Yes Hall, Carole N, DO  atorvastatin (LIPITOR) 20 MG tablet Take 1 tablet (20 mg total) by mouth at bedtime. 04/19/19  Yes Hall, Archie Patten N, DO  Blood Glucose Monitoring Suppl (TRUE METRIX METER) w/Device  KIT Use as instructed. Check blood glucose level by fingerstick twice per day. 03/18/19  Yes [name], NP  carboxymethylcellulose (REFRESH PLUS) 0.5 % SOLN Place 1 drop into both eyes daily.    Yes [provider]  docusate sodium (COLACE) 100 MG capsule Take 1 capsule (100 mg total) by mouth 2 (two) times daily. 03/01/19  Yes Vasireddy, Grier Mitts, MD  ferrous sulfate 325 (65 FE) MG tablet Take 1 tablet (325 mg total) by mouth daily. Patient will pick up scripts today. 03/18/19 06/16/19 Yes [name], NP  glucose blood (TRUE METRIX BLOOD GLUCOSE TEST) test strip Use as instructed. Check blood glucose level by fingerstick twice per day. 03/18/19  Yes [name], NP  hydrocortisone (ANUSOL-HC) 25 MG suppository Place 1 suppository (25 mg total) rectally 2 (two) times daily as needed for hemorrhoids or anal itching. 05/28/19  Yes Clegg, Amy D, NP  labetalol (NORMODYNE) 200 MG tablet Take 1 tablet (200 mg total) by mouth 2 (two) times daily. 04/19/19  Yes Hall, Carole N, DO  levalbuterol [facility] HFA) 45 MCG/ACT inhaler Inhale 2 puffs into the lungs every 4 (four) hours as needed for wheezing.    Yes [provider]  metFORMIN (GLUCOPHAGE) 500 MG tablet Take 1 tablet (500 mg total) by mouth 2 (two) times a day. Patient will pick up scripts today. Patient taking differently: Take 500 mg by mouth 2 (two) times daily with a meal. Patient will pick up scripts today. 03/18/19 06/16/19 Yes [name], NP  omeprazole (PRILOSEC) 20 MG capsule Take 1 capsule (20 mg total) by mouth daily. 05/19/19  Yes [name], NP  polyethylene glycol (MIRALAX / GLYCOLAX) 17 g packet Take 17 g by mouth daily as needed for severe constipation. 03/01/19  Yes Vasireddy, Grier Mitts, MD  potassium chloride (K-DUR) 10 MEQ tablet Take 2 tablets (20 mEq total) by mouth daily. 04/19/19  Yes Kayleen Memos, DO  rivaroxaban (XARELTO) 20 MG TABS tablet Take 1 tablet (20 mg total) by mouth daily with supper.  04/19/19  Yes Hall, Carole N, DO  senna-docusate (SENOKOT-S) 8.6-50 MG tablet Take 2 tablets by mouth daily. 05/19/19 06/18/19 Yes [name], NP  torsemide (DEMADEX) 20 MG tablet Take 4 tablets (80 mg total) by mouth 2 (two) times daily. 05/28/19  Yes Clegg, Amy D, NP  TRUEplus Lancets 28G MISC Use as instructed. Check blood glucose level by fingerstick twice per day. 03/18/19  Yes [name], NP  pantoprazole (PROTONIX) 40 MG tablet Take 1 tablet (40 mg total) by mouth daily. Patient not taking: Reported on 05/13/2019 04/20/19   Kayleen Memos, DO    Family History Family History  Problem Relation Age of Onset  . Hypertension Mother   . Diabetes Mother     Social History Social History   Tobacco Use  . Smoking status: Former Research scientist (life sciences)  . Smokeless tobacco: Never Used  Substance Use Topics  . Alcohol use:  Not Currently  . Drug use: Not Currently     Allergies   Patient has no known allergies.   Review of Systems Review of Systems  Constitutional: Positive for fatigue.  Respiratory: Positive for shortness of breath.   Cardiovascular: Positive for leg swelling.  Gastrointestinal: Positive for blood in stool. Negative for constipation, diarrhea, nausea and vomiting.  Neurological: Positive for light-headedness.  All other systems reviewed and are negative.    Physical Exam Updated Vital Signs BP (!) 146/78   Pulse 88   Temp 98.4 F (36.9 C) (Oral)   Resp (!) 25   Ht _0  (1.803 m)   Wt (!) 185.1 kg   SpO2 96%   BMI 56.90 kg/m   Physical Exam Vitals signs and nursing note reviewed.  Constitutional:      Appearance: He is well-developed.     Comments: Morbidly obese.  No acute distress.  HENT:     Head: Normocephalic and atraumatic.  Eyes:     Conjunctiva/sclera: Conjunctivae normal.  Cardiovascular:     Rate and Rhythm: Normal rate and regular rhythm.  Pulmonary:     Effort: Pulmonary effort is normal. No respiratory distress.     Comments:  Diminished bilaterally Abdominal:     General: Bowel sounds are normal.     Palpations: Abdomen is soft.     Tenderness: There is no abdominal tenderness. There is no guarding or rebound.  Genitourinary:    Comments: Exam performed with RN chaperone present.  There is a somewhat tender internal hemorrhoid present.  No gross blood or melena present. Musculoskeletal:     Comments: Significant bilateral lower extremity edema is present.  Skin:    General: Skin is warm.  Neurological:     Mental Status: He is alert.  Psychiatric:        Behavior: Behavior normal.      ED Treatments / Results  Labs (all labs ordered are listed, but only abnormal results are displayed) Labs Reviewed  POC OCCULT BLOOD, ED - Abnormal; Notable for the following components:      Result Value   Fecal Occult Bld POSITIVE (*)    All other components within normal limits  COMPREHENSIVE METABOLIC PANEL  CBC  TYPE AND SCREEN  PREPARE RBC (CROSSMATCH)    EKG None  Radiology No results found.  Procedures .Critical Care Performed by: , [place] N, PA-C Authorized by: , [place] N, PA-C   Critical care provider statement:    Critical care time (minutes):  35   Critical care time was exclusive of:  Separately billable procedures and treating other patients and teaching time   Critical care was necessary to treat or prevent imminent or life-threatening deterioration of the following conditions:  Circulatory failure   Critical care was time spent personally by me on the following activities:  Discussions with consultants, evaluation of patient's response to treatment, examination of patient, ordering and performing treatments and interventions, ordering and review of laboratory studies, ordering and review of radiographic studies, pulse oximetry, re-evaluation of patient's condition, obtaining history from patient or surrogate and review of old charts   I assumed direction of critical care for  this patient from another provider in my specialty: no     (including critical care time)  Medications Ordered in ED Medications  hydrocortisone (ANUSOL-HC) suppository 25 mg (has no administration in time range)  sodium chloride flush (NS) 0.9 % injection 3 mL (has no administration in time range)  sodium chloride flush (NS)  0.9 % injection 3 mL (has no administration in time range)  0.9 %  sodium chloride infusion (has no administration in time range)  acetaminophen (TYLENOL) tablet 650 mg (has no administration in time range)    Or  acetaminophen (TYLENOL) suppository 650 mg (has no administration in time range)  0.9 %  sodium chloride infusion (Manually program via Guardrails IV Fluids) ( Intravenous New Bag/Given 05/28/19 1800)  pantoprazole (PROTONIX) injection 40 mg (40 mg Intravenous Given 05/28/19 1824)     Initial Impression / Assessment and Plan / ED Course  I have reviewed the triage vital signs and the nursing notes.  Pertinent labs & imaging results that were available during my care of the patient were reviewed by me and considered in my medical decision making (see chart for details).  Clinical Course as of May 27 1933  Tue May 27, 3077  [id number] 53 year old male here for anemia in the setting of rectal bleeding.  Sounds like he has some chronic rectal bleeding due to hemorrhoids but this past more melanotic stool.  He was at CHF clinic today with a good lab work and then told him he needed to come to the hospital.  He is complaining of some moderate shortness of breath and some peripheral edema also.  Reviewed his labs done earlier today and will get him admitted for transfusion   [MB]  74 Consulted with Dr. Alessandra Bevels, recommends medical admission, PPI, annusol, liquid diet.    [JR]    Clinical Course User Index [JR] , [place] N, PA-C [MB] Hayden Rasmussen, MD       Patient on Xarelto with history of A. fib and PE, also with history of bleeding hemorrhoids,  presenting to the ED with new onset of melena 1 week ago and symptomatic anemia.  Low hemoglobin at CHF clinic today.  BMP within normal limits.  Hemoglobin today is 7.1 with hematocrit of 24.4.  His Hemoccult is positive though is not actively bleeding.  Given patient's symptomatic anemia, will transfuse 1 unit and admit.  Consulted GI who will see the patient during admission.  Recommends PPI, Anusol and liquid diet.  [facility] accepting admission.  .  The patient appears reasonably stabilized for admission considering the current resources, flow, and capabilities available in the ED at this time, and I doubt any other [facility] requiring further screening and/or treatment in the ED prior to admission.   Final Clinical Impressions(s) / ED Diagnoses   Final diagnoses:  Symptomatic anemia  Gastrointestinal hemorrhage with melena    ED Discharge Orders    None       , [place] N, PA-C 05/28/19 1934    Hayden Rasmussen, MD 05/29/19 1103

## 2019-05-28 NOTE — Progress Notes (Signed)
Received patient from ED via wheelchair with blood transfusing, AOx4, ambulatory, VSS, denies pain, requested something to drink and follow up of his dinner tray. Pt oriented to room, bed controls, call light and explained plan of car, will monitor.

## 2019-05-28 NOTE — ED Triage Notes (Signed)
Pt arrives to ED from home with complaints of rectal bleeding x3-4 weeks and dizziness. Pt states he had blood work done today and received a call stating his hemoglobin was "about 7". Pt placed in position of comfort with bed locked and lowered, call bell in reach.

## 2019-05-28 NOTE — H&P (Signed)
TRH H&P    Patient Demographics:    Benjamin [name], is a 53 y.o. male  MRN: [id number]  DOB - November 26, 1965  Admit Date - 05/28/2019  Referring MD/NP/PA:  Martinique Robinson  Outpatient Primary MD for the patient is [name], NP Arta Silence - did EGD/ colonoscopy on prior admission in June 2020  Patient coming from:  home  Chief complaint-  anemia   HPI:    Navon Kotowski  is a 53 y.o. male, w hypertension, Dm2, Pafib, h/o elevated troponin, h/o PE (01/21/2019), Covid -19 positive 02/06/2019, OSA, apparently presents with c/o rectal bleeding for the past 4 weeks intermittently.  Pt was sent b cardiology for abnomally low Hgb.  7.1   Pt states that has had occasional LLQ pain intermittent worse with food or with bowel movement.  Currently not having pain.  Having loose stool.   Pt denies fever, chills, cough, cp, palp, sob, n/v, constipation, dysuria, hematuria.  Pt notes has had prior EGD/ colonoscopy on 02/25/2019 -> hemorrhoids, otherwise negative  In ED,  T 99.2, P 92, R 20 Bp 134/50  Pox 99% on RA  Wbc 8.6, hgb 7.1, Plt 418 Na 138, K 3.4, Bun 12, Creatinine 1.34  BNP 25.1 FOBT positive  GI (? [place]), consulted by ED, and stated that they will consult on the patient  Pt will be admitted for rectal bleeding and symptomatic anemia.    Review of systems:    In addition to the HPI above,  No Fever-chills, No Headache, No changes with Vision or hearing, No problems swallowing food or Liquids, No Chest pain, Cough or Shortness of Breath, No Nausea or Vomiting No Blood in Urine, No dysuria, No new skin rashes or bruises, No new joints pains-aches,  No new weakness, tingling, numbness in any extremity, No recent weight gain or loss, No polyuria, polydypsia or polyphagia, No significant Mental Stressors.  All other systems reviewed and are negative.    Past History of the following :    Past Medical History:  Diagnosis Date  . A-fib ([place])   . Diabetes mellitus without complication ([place])   . Elevated troponin 02/06/2019  . Hypertension   . Obesity   . Pulmonary embolism ([place])   . Sleep apnea       Past Surgical History:  Procedure Laterality Date  . COLONOSCOPY WITH PROPOFOL Left 02/25/2019   Procedure: COLONOSCOPY WITH PROPOFOL;  Surgeon: Arta Silence, MD;  Location: [place];  Service: Endoscopy;  Laterality: Left;  . ESOPHAGOGASTRODUODENOSCOPY (EGD) WITH PROPOFOL Left 02/25/2019   Procedure: ESOPHAGOGASTRODUODENOSCOPY (EGD) WITH PROPOFOL;  Surgeon: Arta Silence, MD;  Location: [facility] ENDOSCOPY;  Service: Endoscopy;  Laterality: Left;  . NO PAST SURGERIES    . RIGHT HEART CATH N/A 04/17/2019   Procedure: RIGHT HEART CATH;  Surgeon: Larey Dresser, MD;  Location: [place] CV LAB;  Service: Cardiovascular;  Laterality: N/A;      Social History:      Social History   Tobacco Use  . Smoking status: Former Research scientist (life sciences)  .  Smokeless tobacco: Never Used  Substance Use Topics  . Alcohol use: Not Currently       Family History :     Family History  Problem Relation Age of Onset  . Hypertension Mother   . Diabetes Mother        Home Medications:   Prior to Admission medications   Medication Sig Start Date End Date Taking? Authorizing Provider  albuterol (VENTOLIN HFA) 108 (90 Base) MCG/ACT inhaler Inhale 2 puffs into the lungs every 6 (six) hours as needed for wheezing or shortness of breath.    Yes [provider]  amLODipine (NORVASC) 10 MG tablet Take 1 tablet (10 mg total) by mouth daily. 04/20/19  Yes Hall, Carole N, DO  atorvastatin (LIPITOR) 20 MG tablet Take 1 tablet (20 mg total) by mouth at bedtime. 04/19/19  Yes Hall, Archie Patten N, DO  Blood Glucose Monitoring Suppl (TRUE METRIX METER) w/Device KIT Use as instructed. Check blood glucose level by fingerstick twice per day. 03/18/19  Yes [name], NP  carboxymethylcellulose (REFRESH  PLUS) 0.5 % SOLN Place 1 drop into both eyes daily.    Yes [provider]  docusate sodium (COLACE) 100 MG capsule Take 1 capsule (100 mg total) by mouth 2 (two) times daily. 03/01/19  Yes Vasireddy, Grier Mitts, MD  ferrous sulfate 325 (65 FE) MG tablet Take 1 tablet (325 mg total) by mouth daily. Patient will pick up scripts today. 03/18/19 06/16/19 Yes [name], NP  glucose blood (TRUE METRIX BLOOD GLUCOSE TEST) test strip Use as instructed. Check blood glucose level by fingerstick twice per day. 03/18/19  Yes [name], NP  hydrocortisone (ANUSOL-HC) 25 MG suppository Place 1 suppository (25 mg total) rectally 2 (two) times daily as needed for hemorrhoids or anal itching. 05/28/19  Yes Clegg, Amy D, NP  labetalol (NORMODYNE) 200 MG tablet Take 1 tablet (200 mg total) by mouth 2 (two) times daily. 04/19/19  Yes Hall, Carole N, DO  levalbuterol [facility] HFA) 45 MCG/ACT inhaler Inhale 2 puffs into the lungs every 4 (four) hours as needed for wheezing.    Yes [provider]  metFORMIN (GLUCOPHAGE) 500 MG tablet Take 1 tablet (500 mg total) by mouth 2 (two) times a day. Patient will pick up scripts today. Patient taking differently: Take 500 mg by mouth 2 (two) times daily with a meal. Patient will pick up scripts today. 03/18/19 06/16/19 Yes [name], NP  omeprazole (PRILOSEC) 20 MG capsule Take 1 capsule (20 mg total) by mouth daily. 05/19/19  Yes [name], NP  polyethylene glycol (MIRALAX / GLYCOLAX) 17 g packet Take 17 g by mouth daily as needed for severe constipation. 03/01/19  Yes Vasireddy, Grier Mitts, MD  potassium chloride (K-DUR) 10 MEQ tablet Take 2 tablets (20 mEq total) by mouth daily. 04/19/19  Yes Kayleen Memos, DO  rivaroxaban (XARELTO) 20 MG TABS tablet Take 1 tablet (20 mg total) by mouth daily with supper. 04/19/19  Yes Hall, Carole N, DO  senna-docusate (SENOKOT-S) 8.6-50 MG tablet Take 2 tablets by mouth daily. 05/19/19 06/18/19 Yes [name], NP   torsemide (DEMADEX) 20 MG tablet Take 4 tablets (80 mg total) by mouth 2 (two) times daily. 05/28/19  Yes Clegg, Amy D, NP  TRUEplus Lancets 28G MISC Use as instructed. Check blood glucose level by fingerstick twice per day. 03/18/19  Yes [name], NP  pantoprazole (PROTONIX) 40 MG tablet Take 1 tablet (40 mg total) by mouth  daily. Patient not taking: Reported on 05/13/2019 04/20/19   Kayleen Memos, DO     Allergies:    No Known Allergies   Physical Exam:   Vitals  Blood pressure (!) 146/78, pulse 88, temperature 98.4 F (36.9 C), temperature source Oral, resp. rate (!) 25, height 5' 11"  (1.803 m), weight (!) 185.1 kg, SpO2 96 %.  1.  General: axoxo3  2. Psychiatric: euthymic  3. Neurologic: nonfocal  4. HEENMT:  Anicteric, pale conjuctiva,  Pupils 1.28m symmetric, direct, consensual intact Neck: no jvd,   5. Respiratory : CTAB  6. Cardiovascular : rrr s1, s2, no m/g/r  7. Gastrointestinal:  Abd: soft, morbidly obese, nt, nd, +bs  8. Skin:  Ext: no c/c/e,  No rash  9.Musculoskeletal:  Good ROM    Data Review:    CBC Recent Labs  Lab 05/28/19 1108  WBC 8.6  HGB 7.1*  HCT 24.4*  PLT 418*  MCV 78.0*  MCH 22.7*  MCHC 29.1*  RDW 15.9*   ------------------------------------------------------------------------------------------------------------------  Results for orders placed or performed during the hospital encounter of 05/28/19 (from the past 48 hour(s))  Type and screen [facility]    Status: None (Preliminary result)   Collection Time: 05/28/19  4:57 PM  Result Value Ref Range   ABO/RH(D) O POS    Antibody Screen NEG    Sample Expiration 05/31/2019,2359    Unit Number [id number]   Blood Component Type RED CELLS,LR    Unit division 00    Status of Unit ISSUED    Transfusion Status OK TO TRANSFUSE    Crossmatch Result      Compatible Performed at [facility] [address] [place] [address]   Prepare RBC     Status: None   Collection Time: 05/28/19  4:57 PM  Result Value Ref Range   Order Confirmation      ORDER PROCESSED BY BLOOD BANK Performed at [facility] [address] [place] [address]  POC occult blood, ED Provider will collect     Status: Abnormal   Collection Time: 05/28/19  5:14 PM  Result Value Ref Range   Fecal Occult Bld POSITIVE (A) NEGATIVE    Chemistries  Recent Labs  Lab 05/28/19 1108  NA 138  K 3.4*  CL 103  CO2 27  GLUCOSE 105*  BUN 12  CREATININE 1.34*  CALCIUM 8.5*   ------------------------------------------------------------------------------------------------------------------  ------------------------------------------------------------------------------------------------------------------ GFR: Estimated Creatinine Clearance: 107.5 mL/min (A) (by C-G formula based on SCr of 1.34 mg/dL (H)). Liver Function Tests: No results for input(s): AST, ALT, ALKPHOS, BILITOT, PROT, ALBUMIN in the last 168 hours. No results for input(s): LIPASE, AMYLASE in the last 168 hours. No results for input(s): AMMONIA in the last 168 hours. Coagulation Profile: No results for input(s): INR, PROTIME in the last 168 hours. Cardiac Enzymes: No results for input(s): CKTOTAL, CKMB, CKMBINDEX, TROPONINI in the last 168 hours. BNP (last 3 results) No results for input(s): PROBNP in the last 8760 hours. HbA1C: No results for input(s): HGBA1C in the last 72 hours. CBG: No results for input(s): GLUCAP in the last 168 hours. Lipid Profile: No results for input(s): CHOL, HDL, LDLCALC, TRIG, CHOLHDL, LDLDIRECT in the last 72 hours. Thyroid Function Tests: No results for input(s): TSH, T4TOTAL, FREET4, T3FREE, THYROIDAB in the last 72 hours. Anemia Panel: No results for input(s): VITAMINB12, FOLATE, FERRITIN, TIBC, IRON, RETICCTPCT in the last 72 hours.   ---------------------------------------------------------------------------------------------------------------  Urine analysis:    Component Value Date/Time   COLORURINE STRAW (A) 04/13/2019 0044   APPEARANCEUR CLEAR 04/13/2019 0044   LABSPEC 1.009 04/13/2019 0044   PHURINE 5.0 04/13/2019 0044   GLUCOSEU NEGATIVE 04/13/2019 0044   HGBUR NEGATIVE 04/13/2019 0044   BILIRUBINUR NEGATIVE 04/13/2019 0044   KETONESUR NEGATIVE 04/13/2019 0044   PROTEINUR NEGATIVE 04/13/2019 0044   NITRITE NEGATIVE 04/13/2019 0044   LEUKOCYTESUR NEGATIVE 04/13/2019 0044      Imaging Results:    No results found.     Assessment & Plan:    Principal Problem:   Anemia Active Problems:   Diabetes mellitus type 2 in obese (HCC)   Chronic diastolic CHF (congestive heart failure) (HCC)   PAF (paroxysmal atrial fibrillation) (HCC)   Rectal bleeding   OSA on CPAP  Anemia Rectal Bleeding  Hold Iron temporarily, can restart on discharge Finish transfusion of 1 unit prbc as ordered by ED Clear liquid til MN then NPO Cbc after transfusion and then in am GI consulted by ED, appreciate input  H/o PE STOP Xarelto for now, warned pt that has increase risk of blood clot and also stroke without anticoagulation but due to rectal bleeding will hold for now  Pafib STOP Xarelto for now Cont Labetalol 272m po bid  H/o CHF (diastolic) Cont Labetalol as above Cont Torsemide 853mpo bid  Hypertension Hold off on Amlodipine due to bp slightly soft Cont Labetalol as above  GeKey[employer]Dm2 STOP Metformin for now,  Fsbs q4h, ISS  Constipation Cont Colace Cont Senna -docusate Cont Miralax prn  OSA on Bipap Cont Bipap per home setting   DVT Prophylaxis-   SCD  AM Labs Ordered, also please review Full Orders  Family Communication: Admission, patients condition and plan of care including tests being ordered have been discussed with the patient  who indicate understanding and agree with the  plan and Code Status.  Code Status:  FULL CODE per patient, notified mother Dophine BaNonie Hoyerhat [facility] Observation: Based on patients clinical presentation and evaluation of above clinical data, I have made determination that patient meets observation criteria at this time.   Time spent in minutes : 70 minutes   JaJani Gravel.D on 05/28/2019 at 7:35 PM

## 2019-05-28 NOTE — Progress Notes (Signed)
Came out today after clinic visit for med rec of torsemide-his dosage was increased to 80mg  BID until seen at clinic next week.  While I was there the nurse from clinic called and advised him to go to ER due to very low hemoglobin. He had just had episode of bloody prior to my arrival-he was sweaty and felt a tad dizzy. I then called for EMS transport unit and was taken to ER.   B/p-150/100 p-102 sp02-95%  [name], EMT-Paramedic  05/28/19

## 2019-05-28 NOTE — Progress Notes (Signed)
Paramedicine Encounter   Patient ID: Benjamin [name] , male,   DOB: [birth year],53 y.o.,  MRN: [id number]   Met patient in clinic today with provider.   SP02-94% HR-108 B/P-164/92 Weight @ home-407 Weight @ clinic-408 Weight @ yesterday-399  When pt walked back here he was sob, his weight is up 8lbs from yesterday. He had 2 slices of pizza last night for dinner.  He said his bleeding is getting worse. He will likely go to ER after he leaves the clinic appointment this morning.  He has a lot of swelling to his legs up to his knees.  His torsemide is going to be increased 13m/80mg until he comes back next week.  As far as his hemorrhoids goes, the GI doc wanted him to take annusol as needed-not sure how we going to get that-if [facility] can supply that.  He has not heard from [facility] yet. When jenna gets back next week I will ask her to follow up on that.   KMarylouise Stacks EHatley9/04/2019

## 2019-05-28 NOTE — Progress Notes (Signed)
Talked with patient about using Bipap this evening.  Patient stated he uses a Bipap at home; said his settings were 10/5.  I asked patient which mask he uses, he stated he uses one that is oral.  I explained that we have a mask that goes over the nose and mouth, patient stated he cannot use that kind of mask and then he refused the Bipap.

## 2019-05-28 NOTE — ED Notes (Signed)
ED TO INPATIENT HANDOFF REPORT  ED Nurse Name and Phone #: Laurena Bering, RN [phone]  S Name/Age/Gender Benjamin [name] 53 y.o. male Room/Bed: 021C/021C  Code Status   Code Status: Full Code  Home/SNF/Other Home Patient oriented to: self, place, time and situation Is this baseline? Yes   Triage Complete: Triage complete  Chief Complaint gi bleed  Triage Note Pt arrives to ED from home with complaints of rectal bleeding x3-4 weeks and dizziness. Pt states he had blood work done today and received a call stating his hemoglobin was "about 7". Pt placed in position of comfort with bed locked and lowered, call bell in reach.     Allergies No Known Allergies  Level of Care/Admitting Diagnosis ED Disposition    ED Disposition Condition Comment   Admit  Hospital Area: [facility] [[id number]]  Level of Care: Telemetry Medical [104]  I expect the patient will be discharged within 24 hours: No (not a candidate for 5C-Observation unit)  Covid Evaluation: Asymptomatic Screening Protocol (No Symptoms)  Diagnosis: Anemia [751025]  Admitting Physician: Jani Gravel [[id number]]  Attending Physician: Jani Gravel [[id number]]  PT Class (Do Not Modify): Observation [104]  PT Acc Code (Do Not Modify): Observation [10022]       B Medical/Surgery History Past Medical History:  Diagnosis Date  . A-fib ([place])   . Diabetes mellitus without complication ([place])   . Elevated troponin 02/06/2019  . Hypertension   . Obesity   . Pulmonary embolism ([place])   . Sleep apnea    Past Surgical History:  Procedure Laterality Date  . COLONOSCOPY WITH PROPOFOL Left 02/25/2019   Procedure: COLONOSCOPY WITH PROPOFOL;  Surgeon: Arta Silence, MD;  Location: [place];  Service: Endoscopy;  Laterality: Left;  . ESOPHAGOGASTRODUODENOSCOPY (EGD) WITH PROPOFOL Left 02/25/2019   Procedure: ESOPHAGOGASTRODUODENOSCOPY (EGD) WITH PROPOFOL;  Surgeon: Arta Silence, MD;  Location[facility] ENDOSCOPY;   Service: Endoscopy;  Laterality: Left;  . NO PAST SURGERIES    . RIGHT HEART CATH N/A 04/17/2019   Procedure: RIGHT HEART CATH;  Surgeon: Larey Dresser, MD;  Location: [place] CV LAB;  Service: Cardiovascular;  Laterality: N/A;     A IV Location/Drains/Wounds Patient Lines/Drains/Airways Status   Active Line/Drains/Airways    Name:   Placement date:   Placement time:   Site:   Days:   Peripheral IV 05/28/19 Right Forearm   05/28/19    1609    Forearm   less than 1          Intake/Output Last 24 hours No intake or output data in the 24 hours ending 05/28/19 1907  Labs/Imaging Results for orders placed or performed during the hospital encounter of 05/28/19 (from the past 48 hour(s))  Type and screen [place]     Status: None (Preliminary result)   Collection Time: 05/28/19  4:57 PM  Result Value Ref Range   ABO/RH(D) O POS    Antibody Screen NEG    Sample Expiration 05/31/2019,2359    Unit Number [id number]    Blood Component Type RED CELLS,LR    Unit division 00    Status of Unit ISSUED    Transfusion Status OK TO TRANSFUSE    Crossmatch Result      Compatible Performed at [facility], [address] [place], [place] [address]   Prepare RBC     Status: None   Collection Time: 05/28/19  4:57 PM  Result Value Ref Range   Order  Confirmation      ORDER PROCESSED BY BLOOD BANK Performed at [facility], [address], [place], [place] [address]   POC occult blood, ED Provider will collect     Status: Abnormal   Collection Time: 05/28/19  5:14 PM  Result Value Ref Range   Fecal Occult Bld POSITIVE (A) NEGATIVE   No results found.  Pending Labs Unresulted Labs (From admission, onward)    Start     Ordered   05/29/19 0500  Comprehensive metabolic panel  Tomorrow morning,   R     05/28/19 1828   05/29/19 0500  CBC  Tomorrow morning,   R     05/28/19 1828          Vitals/Pain Today's Vitals   05/28/19 1645 05/28/19 1715  05/28/19 1809 05/28/19 1844  BP: 104/78 111/68 106/62 (!) 146/78  Pulse: 92 86 85 88  Resp: 17 18 13  (!) 25  Temp:   98.8 F (37.1 C) 98.4 F (36.9 C)  TempSrc:   Oral Oral  SpO2: 100% 99% 100% 96%  Weight:      Height:      PainSc:        Isolation Precautions No active isolations  Medications Medications  hydrocortisone (ANUSOL-HC) suppository 25 mg (has no administration in time range)  sodium chloride flush (NS) 0.9 % injection 3 mL (has no administration in time range)  sodium chloride flush (NS) 0.9 % injection 3 mL (has no administration in time range)  0.9 %  sodium chloride infusion (has no administration in time range)  acetaminophen (TYLENOL) tablet 650 mg (has no administration in time range)    Or  acetaminophen (TYLENOL) suppository 650 mg (has no administration in time range)  0.9 %  sodium chloride infusion (Manually program via Guardrails IV Fluids) ( Intravenous New Bag/Given 05/28/19 1800)  pantoprazole (PROTONIX) injection 40 mg (40 mg Intravenous Given 05/28/19 1824)    Mobility walks Moderate fall risk   Focused Assessments    R Recommendations: See Admitting Provider Note  Report given to:   Additional Notes: Blood transfusion

## 2019-05-28 NOTE — Patient Instructions (Signed)
Lab work done today. We will notify you of any abnormal lab work. No news is good news!  INCREASE Torsemide to 80mg  two times daily.  Please follow up with [facility]  START Anusol 25mg  suppository. You make 1 suppository as needed twice a day as needed.  Please follow up with the [facility] in 7 days.  At the [facility], you and your health needs are our priority. As part of our continuing mission to provide you with exceptional heart care, we have created designated Provider Care Teams. These Care Teams include your primary Cardiologist (physician) and Advanced Practice Providers (APPs- Physician Assistants and Nurse Practitioners) who all work together to provide you with the care you need, when you need it.   You may see any of the following providers on your designated Care Team at your next follow up: Marland Kitchen Dr Glori Bickers . Dr Loralie Champagne . Darrick Grinder, NP   Please be sure to bring in all your medications bottles to every appointment.

## 2019-05-29 ENCOUNTER — Encounter (HOSPITAL_COMMUNITY): Payer: Self-pay | Admitting: General Practice

## 2019-05-29 ENCOUNTER — Telehealth (HOSPITAL_COMMUNITY): Payer: Self-pay

## 2019-05-29 DIAGNOSIS — G4733 Obstructive sleep apnea (adult) (pediatric): Secondary | ICD-10-CM | POA: Diagnosis present

## 2019-05-29 DIAGNOSIS — K625 Hemorrhage of anus and rectum: Secondary | ICD-10-CM | POA: Diagnosis not present

## 2019-05-29 DIAGNOSIS — K921 Melena: Secondary | ICD-10-CM | POA: Diagnosis not present

## 2019-05-29 DIAGNOSIS — E876 Hypokalemia: Secondary | ICD-10-CM | POA: Diagnosis present

## 2019-05-29 DIAGNOSIS — K644 Residual hemorrhoidal skin tags: Secondary | ICD-10-CM | POA: Diagnosis present

## 2019-05-29 DIAGNOSIS — D649 Anemia, unspecified: Secondary | ICD-10-CM | POA: Diagnosis not present

## 2019-05-29 DIAGNOSIS — Z20828 Contact with and (suspected) exposure to other viral communicable diseases: Secondary | ICD-10-CM | POA: Diagnosis not present

## 2019-05-29 DIAGNOSIS — K648 Other hemorrhoids: Secondary | ICD-10-CM | POA: Diagnosis present

## 2019-05-29 DIAGNOSIS — E669 Obesity, unspecified: Secondary | ICD-10-CM | POA: Diagnosis present

## 2019-05-29 DIAGNOSIS — Z7901 Long term (current) use of anticoagulants: Secondary | ICD-10-CM | POA: Diagnosis not present

## 2019-05-29 DIAGNOSIS — Z833 Family history of diabetes mellitus: Secondary | ICD-10-CM | POA: Diagnosis not present

## 2019-05-29 DIAGNOSIS — I5032 Chronic diastolic (congestive) heart failure: Secondary | ICD-10-CM | POA: Diagnosis present

## 2019-05-29 DIAGNOSIS — K219 Gastro-esophageal reflux disease without esophagitis: Secondary | ICD-10-CM | POA: Diagnosis present

## 2019-05-29 DIAGNOSIS — Z7984 Long term (current) use of oral hypoglycemic drugs: Secondary | ICD-10-CM | POA: Diagnosis not present

## 2019-05-29 DIAGNOSIS — Z8249 Family history of ischemic heart disease and other diseases of the circulatory system: Secondary | ICD-10-CM | POA: Diagnosis not present

## 2019-05-29 DIAGNOSIS — R1032 Left lower quadrant pain: Secondary | ICD-10-CM | POA: Diagnosis present

## 2019-05-29 DIAGNOSIS — Z8619 Personal history of other infectious and parasitic diseases: Secondary | ICD-10-CM | POA: Diagnosis not present

## 2019-05-29 DIAGNOSIS — K922 Gastrointestinal hemorrhage, unspecified: Secondary | ICD-10-CM | POA: Diagnosis present

## 2019-05-29 DIAGNOSIS — E119 Type 2 diabetes mellitus without complications: Secondary | ICD-10-CM | POA: Diagnosis present

## 2019-05-29 DIAGNOSIS — K5900 Constipation, unspecified: Secondary | ICD-10-CM | POA: Diagnosis present

## 2019-05-29 DIAGNOSIS — Z86711 Personal history of pulmonary embolism: Secondary | ICD-10-CM | POA: Diagnosis not present

## 2019-05-29 DIAGNOSIS — I110 Hypertensive heart disease with heart failure: Secondary | ICD-10-CM | POA: Diagnosis present

## 2019-05-29 DIAGNOSIS — I480 Paroxysmal atrial fibrillation: Secondary | ICD-10-CM | POA: Diagnosis present

## 2019-05-29 DIAGNOSIS — Z87891 Personal history of nicotine dependence: Secondary | ICD-10-CM | POA: Diagnosis not present

## 2019-05-29 DIAGNOSIS — K649 Unspecified hemorrhoids: Secondary | ICD-10-CM | POA: Diagnosis not present

## 2019-05-29 DIAGNOSIS — Z6841 Body Mass Index (BMI) 40.0 and over, adult: Secondary | ICD-10-CM | POA: Diagnosis not present

## 2019-05-29 DIAGNOSIS — E785 Hyperlipidemia, unspecified: Secondary | ICD-10-CM | POA: Diagnosis present

## 2019-05-29 DIAGNOSIS — D62 Acute posthemorrhagic anemia: Secondary | ICD-10-CM | POA: Diagnosis present

## 2019-05-29 HISTORY — DX: Anemia, unspecified: D64.9

## 2019-05-29 HISTORY — DX: Hemorrhage of anus and rectum: K62.5

## 2019-05-29 LAB — CBC
HCT: 24.9 % — ABNORMAL LOW (ref 39.0–52.0)
HCT: 25.7 % — ABNORMAL LOW (ref 39.0–52.0)
HCT: 26.5 % — ABNORMAL LOW (ref 39.0–52.0)
Hemoglobin: 7.3 g/dL — ABNORMAL LOW (ref 13.0–17.0)
Hemoglobin: 7.7 g/dL — ABNORMAL LOW (ref 13.0–17.0)
Hemoglobin: 7.9 g/dL — ABNORMAL LOW (ref 13.0–17.0)
MCH: 23 pg — ABNORMAL LOW (ref 26.0–34.0)
MCH: 23.1 pg — ABNORMAL LOW (ref 26.0–34.0)
MCH: 23 pg — ABNORMAL LOW (ref 26.0–34.0)
MCHC: 29.3 g/dL — ABNORMAL LOW (ref 30.0–36.0)
MCHC: 30 g/dL (ref 30.0–36.0)
MCHC: 29.8 g/dL — ABNORMAL LOW (ref 30.0–36.0)
MCV: 78.3 fL — ABNORMAL LOW (ref 80.0–100.0)
MCV: 76.9 fL — ABNORMAL LOW (ref 80.0–100.0)
MCV: 77 fL — ABNORMAL LOW (ref 80.0–100.0)
Platelets: 423 10*3/uL — ABNORMAL HIGH (ref 150–400)
Platelets: 441 10*3/uL — ABNORMAL HIGH (ref 150–400)
Platelets: 448 10*3/uL — ABNORMAL HIGH (ref 150–400)
RBC: 3.18 MIL/uL — ABNORMAL LOW (ref 4.22–5.81)
RBC: 3.34 MIL/uL — ABNORMAL LOW (ref 4.22–5.81)
RBC: 3.44 MIL/uL — ABNORMAL LOW (ref 4.22–5.81)
RDW: 16.5 % — ABNORMAL HIGH (ref 11.5–15.5)
RDW: 16.5 % — ABNORMAL HIGH (ref 11.5–15.5)
RDW: 16.5 % — ABNORMAL HIGH (ref 11.5–15.5)
WBC: 10.8 10*3/uL — ABNORMAL HIGH (ref 4.0–10.5)
WBC: 9.6 10*3/uL (ref 4.0–10.5)
WBC: 10.7 10*3/uL — ABNORMAL HIGH (ref 4.0–10.5)
nRBC: 0.2 % (ref 0.0–0.2)
nRBC: 0 % (ref 0.0–0.2)
nRBC: 0.3 % — ABNORMAL HIGH (ref 0.0–0.2)

## 2019-05-29 LAB — TYPE AND SCREEN
ABO/RH(D): O POS
Antibody Screen: NEGATIVE
Unit division: 0

## 2019-05-29 LAB — COMPREHENSIVE METABOLIC PANEL
ALT: 16 U/L (ref 0–44)
AST: 19 U/L (ref 15–41)
Albumin: 3.4 g/dL — ABNORMAL LOW (ref 3.5–5.0)
Alkaline Phosphatase: 42 U/L (ref 38–126)
Anion gap: 11 (ref 5–15)
BUN: 9 mg/dL (ref 6–20)
CO2: 28 mmol/L (ref 22–32)
Calcium: 8.2 mg/dL — ABNORMAL LOW (ref 8.9–10.3)
Chloride: 99 mmol/L (ref 98–111)
Creatinine, Ser: 1.36 mg/dL — ABNORMAL HIGH (ref 0.61–1.24)
GFR calc Af Amer: >60 mL/min (ref >60)
GFR calc non Af Amer: 59 mL/min — ABNORMAL LOW (ref >60)
Glucose, Bld: 105 mg/dL — ABNORMAL HIGH (ref 70–99)
Potassium: 3.3 mmol/L — ABNORMAL LOW (ref 3.5–5.1)
Sodium: 138 mmol/L (ref 135–145)
Total Bilirubin: 0.2 mg/dL — ABNORMAL LOW (ref 0.3–1.2)
Total Protein: 6.9 g/dL (ref 6.5–8.1)

## 2019-05-29 LAB — BPAM RBC
Blood Product Expiration Date: 202010122359
ISSUE DATE / TIME: 202009081819
Unit Type and Rh: 5100

## 2019-05-29 LAB — GLUCOSE, CAPILLARY
Glucose-Capillary: 107 mg/dL — ABNORMAL HIGH (ref 70–99)
Glucose-Capillary: 108 mg/dL — ABNORMAL HIGH (ref 70–99)
Glucose-Capillary: 115 mg/dL — ABNORMAL HIGH (ref 70–99)
Glucose-Capillary: 118 mg/dL — ABNORMAL HIGH (ref 70–99)
Glucose-Capillary: 122 mg/dL — ABNORMAL HIGH (ref 70–99)
Glucose-Capillary: 127 mg/dL — ABNORMAL HIGH (ref 70–99)
Glucose-Capillary: 91 mg/dL (ref 70–99)
Glucose-Capillary: 95 mg/dL (ref 70–99)

## 2019-05-29 LAB — PROTIME-INR
INR: 1.1 (ref 0.8–1.2)
Prothrombin Time: 13.6 seconds (ref 11.4–15.2)

## 2019-05-29 MED ORDER — POTASSIUM CHLORIDE CRYS ER 20 MEQ PO TBCR
40.0000 meq | EXTENDED_RELEASE_TABLET | Freq: Once | ORAL | Status: AC
  Administered 2019-05-29: 40 meq via ORAL
  Filled 2019-05-29: qty 2

## 2019-05-29 MED ORDER — HYDROCORTISONE ACETATE 25 MG RE SUPP
25.0000 mg | Freq: Two times a day (BID) | RECTAL | Status: DC
  Administered 2019-05-29 – 2019-05-30 (×3): 25 mg via RECTAL
  Filled 2019-05-29 (×3): qty 1

## 2019-05-29 MED ORDER — SODIUM CHLORIDE 0.9 % IV SOLN
INTRAVENOUS | Status: DC
  Administered 2019-05-29: 09:00:00 via INTRAVENOUS

## 2019-05-29 MED ORDER — CARBOXYMETHYLCELLULOSE SODIUM 0.5 % OP SOLN: 1.0000 [drp] | Freq: Every day | OPHTHALMIC | Status: DC

## 2019-05-29 MED ORDER — RIVAROXABAN 20 MG PO TABS
20.0000 mg | ORAL_TABLET | Freq: Every day | ORAL | Status: DC
  Administered 2019-05-29: 18:00:00 20 mg via ORAL
  Filled 2019-05-29: qty 1

## 2019-05-29 MED ORDER — POLYVINYL ALCOHOL 1.4 % OP SOLN
1.0000 [drp] | Freq: Every day | OPHTHALMIC | Status: DC
  Administered 2019-05-29 – 2019-05-30 (×2): 1 [drp] via OPHTHALMIC
  Filled 2019-05-29: qty 15

## 2019-05-29 NOTE — Telephone Encounter (Addendum)
Called pt to review lab work with pt. Pt unable to follow up with [facility]. Advised to go to Emergency room for low hemoglobin and active GI bleed.

## 2019-05-29 NOTE — Progress Notes (Signed)
Patient had bloody liquid stools x 3 since admission on the unit per patient with the last bloody BM seen by this RN. Patient sitting on recliner but encourage to go back to bed since slightly dizzy but patient stated that he is ok.  Will monitor.

## 2019-05-29 NOTE — TOC Initial Note (Signed)
Transition of Care [facility]) - Initial/Assessment Note    Patient Details  Name: Benjamin [name] MRN: [id number] Date of Birth: 08-23-1966  [facility]) CM/SW Contact:    Kingsley Plan, RN Phone Number: 05/29/2019, 11:58 AM  Clinical Narrative:                  Confirmed face sheet information. Patient from half way house. Patient has home oxygen and BiPAP at home. Patient has transportation home at discharge. Person transporting can bring portable oxygen tank.   Patient active with [facility], will continue to follow for discharge needs. Expected Discharge Plan: Home/Self Care Barriers to Discharge: Continued Medical Work up   Patient Goals and CMS Choice Patient states their goals for this hospitalization and ongoing recovery are:: to return to half way house CMS Medicare.gov Compare Post Acute Care list provided to:: Patient Choice offered to / list presented to : NA  Expected Discharge Plan and Services Expected Discharge Plan: Home/Self Care In-house Referral: Financial Counselor Discharge Planning Services: CM Consult Post Acute Care Choice: NA Living arrangements for the past 2 months: (half way house)                 DME Arranged: N/A         HH Arranged: NA          Prior Living Arrangements/Services Living arrangements for the past 2 months: (half way house) Lives with:: Other (Comment)(half way house) Patient language and need for interpreter reviewed:: Yes Do you feel safe going back to the place where you live?: Yes      Need for Family Participation in Patient Care: No (Comment)   Current home services: DME Criminal Activity/Legal Involvement Pertinent to Current Situation/Hospitalization: No - Comment as needed  Activities of Daily Living Home Assistive Devices/Equipment: CPAP ADL Screening (condition at time of admission) Patient's cognitive ability adequate to safely complete daily activities?: Yes Is the  patient deaf or have difficulty hearing?: No Does the patient have difficulty seeing, even when wearing glasses/contacts?: No Does the patient have difficulty concentrating, remembering, or making decisions?: No Patient able to express need for assistance with ADLs?: Yes Does the patient have difficulty dressing or bathing?: No Independently performs ADLs?: Yes (appropriate for developmental age) Does the patient have difficulty walking or climbing stairs?: No Weakness of Legs: None Weakness of Arms/Hands: None  Permission Sought/Granted   Permission granted to share information with : No              Emotional Assessment Appearance:: Appears stated age Attitude/Demeanor/Rapport: Engaged Affect (typically observed): Accepting Orientation: : Oriented to Self, Oriented to Place, Oriented to  Time, Oriented to Situation Alcohol / Substance Use: Not Applicable Psych Involvement: No (comment)  Admission diagnosis:  Gastrointestinal hemorrhage with melena [K92.1] Symptomatic anemia [D64.9] Patient Active Problem List   Diagnosis Date Noted  . Anemia 05/28/2019  . Acute on chronic diastolic CHF (congestive heart failure) (HCC) 04/11/2019  . Iron deficiency anemia 03/11/2019  . OSA on CPAP 03/11/2019  . HLD (hyperlipidemia) 03/11/2019  . Elevated troponin 03/11/2019  . GERD (gastroesophageal reflux disease) 03/11/2019  . Rectal bleeding 02/20/2019  . Dyspnea 02/06/2019  . COVID-19 virus infection 02/06/2019  . Bilateral lower extremity edema   . Obesity, Class III, BMI 40-49.9 (morbid obesity) (HCC)   . Chronic diastolic CHF (congestive heart failure) (HCC)   . PAF (paroxysmal atrial fibrillation) (HCC)   . PE (pulmonary thromboembolism) (HCC) 01/21/2019  .  Hypertensive urgency 01/21/2019  . Diabetes mellitus type 2 in obese ([place]) 01/21/2019   PCP:  [name], NP Pharmacy:   [facility], [place] [address] [place] [address] Phone: [phone] Fax: [place], [place] - [address] [place] [address] Phone: [phone] Fax: [phone]     Social Determinants of Health (SDOH) Interventions    Readmission Risk Interventions Readmission Risk Prevention Plan 04/19/2019  Transportation Screening Complete  PCP or Specialist Appt within 3-5 Days Complete  HRI or [place] Complete  Social Work Consult for [place] Planning/Counseling Complete  Palliative Care Screening Not Applicable  Medication Review Press photographer) Complete  Some recent data might be hidden

## 2019-05-29 NOTE — Progress Notes (Signed)
[facility] PROGRESS NOTE  Benjamin [name] XBM:[id number] DOB: [birth year] DOA: 05/28/2019 PCP: [name], NP  Assessment/Plan:  Anemia/Rectal Bleeding acute blood loss anemia in setting of GI bleed on xarelto. Received 1 unit PRBC's. Had 3 more stools. VSS.  Will likey need more blood -serial cbc -npo  -monitor closely -await GI recs.   H/o PE. Home meds include Xarelto. No sob.  -holding xarelto  Pafib EKG with nsr. Home meds include xarelto. -hold Xarelto for now -Cont Labetalol 200mg  po bid  H/o CHF (diastolic) appears compensated.  -Cont Labetalol as above -Cont Torsemide 80mg  po bid -daily weight -intake and output  Hypertension fair control. Home meds include Amlodipine and Labetalol  -hold amlodipine for now -continue labetalol  Gerd Cont Protonix  Dm2. HgA1c 6.1 last month. Home meds include metformin -STOP Metformin for now,  -Fsbs q4h, ISS  Constipation Cont Colace Cont Senna -docusate Cont Miralax prn  OSA on Bipap Cont Bipap per home setting  Hyperkalemia. repleted -recheck   Code Status: full Family Communication: patient Disposition Plan: back to half way house when ready   Consultants:  [facility]  Procedures:    Antibiotics:    HPI/Subjective: Lying in bed snoring. Awakens to verbal stimuli. Denies pain/discomfort. Requesting food/drink  Objective: Vitals:   05/28/19 2145 05/29/19 0326  BP: 125/73 126/81  Pulse: 91 84  Resp: (!) 23 17  Temp: 98.7 F (37.1 C) 98.4 F (36.9 C)  SpO2: 96% 97%    Intake/Output Summary (Last 24 hours) at 05/29/2019 1205 Last data filed at 05/28/2019 2120 Gross per 24 hour  Intake 585 ml  Output -  Net 585 ml   Filed Weights   05/28/19 1637  Weight: (!) 185.1 kg    Exam:   General:  Awake alert obese no acute distress  Cardiovascular: rrr no mgr trace LE edema  Respiratory: normal effort BS distant but clear  Abdomen: obese soft +BS no guarding or  rebounding  Musculoskeletal: joints without swelling/erythema   Data Reviewed: Basic Metabolic Panel: Recent Labs  Lab 05/28/19 1108 05/29/19 0153  NA 138 138  K 3.4* 3.3*  CL 103 99  CO2 27 28  GLUCOSE 105* 105*  BUN 12 9  CREATININE 1.34* 1.36*  CALCIUM 8.5* 8.2*   Liver Function Tests: Recent Labs  Lab 05/29/19 0153  AST 19  ALT 16  ALKPHOS 42  BILITOT 0.2*  PROT 6.9  ALBUMIN 3.4*   No results for input(s): LIPASE, AMYLASE in the last 168 hours. No results for input(s): AMMONIA in the last 168 hours. CBC: Recent Labs  Lab 05/28/19 1108 05/28/19 2132 05/29/19 0153  WBC 8.6 10.2 10.8*  HGB 7.1* 7.6* 7.3*  HCT 24.4* 25.3* 24.9*  MCV 78.0* 77.4* 78.3*  PLT 418* 425* 423*   Cardiac Enzymes: No results for input(s): CKTOTAL, CKMB, CKMBINDEX, TROPONINI in the last 168 hours. BNP (last 3 results) Recent Labs    05/07/19 1129 05/12/19 0140 05/28/19 1108  BNP 17.1 12.8 25.1    ProBNP (last 3 results) No results for input(s): PROBNP in the last 8760 hours.  CBG: Recent Labs  Lab 05/28/19 2042 05/29/19 0006 05/29/19 0321 05/29/19 0805  GLUCAP 122* 108* 107* 95    Recent Results (from the past 240 hour(s))  SARS Coronavirus 2 [facility] order, Performed in [facility] hospital lab) Nasopharyngeal Nasopharyngeal Swab     Status: None   Collection Time: 05/28/19  7:42 PM   Specimen: Nasopharyngeal Swab  Result Value Ref  Range Status   SARS Coronavirus 2 NEGATIVE NEGATIVE Final    Comment: (NOTE) If result is NEGATIVE SARS-CoV-2 target nucleic acids are NOT DETECTED. The SARS-CoV-2 RNA is generally detectable in upper and lower  respiratory specimens during the acute phase of infection. The lowest  concentration of SARS-CoV-2 viral copies this assay can detect is 250  copies / mL. A negative result does not preclude SARS-CoV-2 infection  and should not be used as the sole basis for treatment or other  patient management decisions.  A negative result  may occur with  improper specimen collection / handling, submission of specimen other  than nasopharyngeal swab, presence of viral mutation(s) within the  areas targeted by this assay, and inadequate number of viral copies  (<250 copies / mL). A negative result must be combined with clinical  observations, patient history, and epidemiological information. If result is POSITIVE SARS-CoV-2 target nucleic acids are DETECTED. The SARS-CoV-2 RNA is generally detectable in upper and lower  respiratory specimens dur ing the acute phase of infection.  Positive  results are indicative of active infection with SARS-CoV-2.  Clinical  correlation with patient history and other diagnostic information is  necessary to determine patient infection status.  Positive results do  not rule out bacterial infection or co-infection with other viruses. If result is PRESUMPTIVE POSTIVE SARS-CoV-2 nucleic acids MAY BE PRESENT.   A presumptive positive result was obtained on the submitted specimen  and confirmed on repeat testing.  While 2019 novel coronavirus  (SARS-CoV-2) nucleic acids may be present in the submitted sample  additional confirmatory testing may be necessary for epidemiological  and / or clinical management purposes  to differentiate between  SARS-CoV-2 and other Sarbecovirus currently known to infect humans.  If clinically indicated additional testing with an alternate test  methodology [phone]) is advised. The SARS-CoV-2 RNA is generally  detectable in upper and lower respiratory sp ecimens during the acute  phase of infection. The expected result is Negative. Fact Sheet for Patients:  BoilerBrush.com.cy Fact Sheet for Healthcare Providers: https://pope.com/ This test is not yet approved or cleared by the [place] FDA and has been authorized for detection and/or diagnosis of SARS-CoV-2 by FDA under an Emergency Use Authorization (EUA).   This EUA will remain in effect (meaning this test can be used) for the duration of the COVID-19 declaration under Section 564(b)(1) of the Act, 21 U.S.C. section 360bbb-3(b)(1), unless the authorization is terminated or revoked sooner. Performed at [facility], [address] [place], [place] [address]      Studies: No results found.  Scheduled Meds: . atorvastatin  20 mg Oral QHS  . docusate sodium  100 mg Oral BID  . hydrocortisone  25 mg Rectal Once  . insulin aspart  0-9 Units Subcutaneous Q4H  . labetalol  200 mg Oral BID  . pantoprazole  40 mg Oral Daily  . polyvinyl alcohol  1 drop Both Eyes Daily  . senna-docusate  2 tablet Oral Daily  . sodium chloride flush  3 mL Intravenous Q12H  . torsemide  80 mg Oral BID   Continuous Infusions: . sodium chloride    . sodium chloride 50 mL/hr at 05/29/19 1829    Principal Problem:   Anemia Active Problems:   Rectal bleeding   Hypokalemia   Diabetes mellitus type 2 in obese (HCC)   Chronic diastolic CHF (congestive heart failure) (HCC)   PAF (paroxysmal atrial fibrillation) (HCC)   OSA on CPAP  Time spent: 40 minutes    Toya SmothersKaren Radhika Dershem, NP [facility]  If 7PM-7AM, please contact night-coverage at [email or web address], password [email or web address] 05/29/2019, 12:05 PM  LOS: 0 days

## 2019-05-29 NOTE — Consult Note (Signed)
Referring Provider:  [place] Primary Care Physician:  [name], NP Primary Gastroenterologist: Althia Forts  Reason for Consultation: Anemia/GI bleed  HPI: Benjamin [name] is a 53 y.o. male with past medical history of paroxysmal atrial fibrillation on Xarelto, history of PE in May 2020, COVID -19 positive on Feb 06, 2019 was sent to hospital by his cardiologist for low hemoglobin of 7.1.  GI is consulted for further evaluation.  He presented to the hospital with hematochezia in June 2020.  Underwent EGD and colonoscopy on February 25, 2019 which showed no evidence of active bleeding and hemorrhoids.  Occult blood positive.  COVID-19  test negative yesterday.  Patient seen and examined at bedside.  According to patient he was doing fine for several weeks after discharge in June.  Subsequently his anticoagulation was changed to Xarelto and he started noticing bright blood on the tissue paper as well as in the commode after bowel movements.  Complaining of constipation with straining to go for bowel movements.  Denies any abdominal pain today.  Denies any nausea or vomiting.  Denies diarrhea.  Denies unintentional weight loss.  Denies any black stool.  Past Medical History:  Diagnosis Date  . A-fib ([place])   . Anemia 05/29/2019  . CHF (congestive heart failure) ([place])   . Diabetes mellitus without complication ([place])   . Elevated troponin 02/06/2019  . Hypertension   . Obesity   . Pulmonary embolism ([place])   . Rectal bleeding 05/29/2019  . Sleep apnea     Past Surgical History:  Procedure Laterality Date  . COLONOSCOPY WITH PROPOFOL Left 02/25/2019   Procedure: COLONOSCOPY WITH PROPOFOL;  Surgeon: Arta Silence, MD;  Location: [facility];  Service: Endoscopy;  Laterality: Left;  . ESOPHAGOGASTRODUODENOSCOPY (EGD) WITH PROPOFOL Left 02/25/2019   Procedure: ESOPHAGOGASTRODUODENOSCOPY (EGD) WITH PROPOFOL;  Surgeon: Arta Silence, MD;  Location: [facility] ENDOSCOPY;  Service: Endoscopy;  Laterality:  Left;  . NO PAST SURGERIES    . RIGHT HEART CATH N/A 04/17/2019   Procedure: RIGHT HEART CATH;  Surgeon: Larey Dresser, MD;  Location: [facility] CV LAB;  Service: Cardiovascular;  Laterality: N/A;    Prior to Admission medications   Medication Sig Start Date End Date Taking? Authorizing Provider  albuterol (VENTOLIN HFA) 108 (90 Base) MCG/ACT inhaler Inhale 2 puffs into the lungs every 6 (six) hours as needed for wheezing or shortness of breath.    Yes [provider]  amLODipine (NORVASC) 10 MG tablet Take 1 tablet (10 mg total) by mouth daily. 04/20/19  Yes Hall, Carole N, DO  atorvastatin (LIPITOR) 20 MG tablet Take 1 tablet (20 mg total) by mouth at bedtime. 04/19/19  Yes Hall, Archie Patten N, DO  Blood Glucose Monitoring Suppl (TRUE METRIX METER) w/Device KIT Use as instructed. Check blood glucose level by fingerstick twice per day. 03/18/19  Yes [name], NP  carboxymethylcellulose (REFRESH PLUS) 0.5 % SOLN Place 1 drop into both eyes daily.    Yes [provider]  docusate sodium (COLACE) 100 MG capsule Take 1 capsule (100 mg total) by mouth 2 (two) times daily. 03/01/19  Yes Vasireddy, Grier Mitts, MD  ferrous sulfate 325 (65 FE) MG tablet Take 1 tablet (325 mg total) by mouth daily. Patient will pick up scripts today. 03/18/19 06/16/19 Yes [name], NP  glucose blood (TRUE METRIX BLOOD GLUCOSE TEST) test strip Use as instructed. Check blood glucose level by fingerstick twice per day. 03/18/19  Yes [name], NP  hydrocortisone (ANUSOL-HC) 25  MG suppository Place 1 suppository (25 mg total) rectally 2 (two) times daily as needed for hemorrhoids or anal itching. 05/28/19  Yes Clegg, Amy D, NP  labetalol (NORMODYNE) 200 MG tablet Take 1 tablet (200 mg total) by mouth 2 (two) times daily. 04/19/19  Yes Hall, Carole N, DO  levalbuterol [facility] HFA) 45 MCG/ACT inhaler Inhale 2 puffs into the lungs every 4 (four) hours as needed for wheezing.    Yes [provider]  metFORMIN (GLUCOPHAGE) 500 MG tablet Take 1 tablet (500 mg total) by mouth 2 (two) times a day. Patient will pick up scripts today. Patient taking differently: Take 500 mg by mouth 2 (two) times daily with a meal. Patient will pick up scripts today. 03/18/19 06/16/19 Yes [name], NP  omeprazole (PRILOSEC) 20 MG capsule Take 1 capsule (20 mg total) by mouth daily. 05/19/19  Yes [name], NP  polyethylene glycol (MIRALAX / GLYCOLAX) 17 g packet Take 17 g by mouth daily as needed for severe constipation. 03/01/19  Yes Vasireddy, Grier Mitts, MD  potassium chloride (K-DUR) 10 MEQ tablet Take 2 tablets (20 mEq total) by mouth daily. 04/19/19  Yes Kayleen Memos, DO  rivaroxaban (XARELTO) 20 MG TABS tablet Take 1 tablet (20 mg total) by mouth daily with supper. 04/19/19  Yes Hall, Carole N, DO  senna-docusate (SENOKOT-S) 8.6-50 MG tablet Take 2 tablets by mouth daily. 05/19/19 06/18/19 Yes [name], NP  torsemide (DEMADEX) 20 MG tablet Take 4 tablets (80 mg total) by mouth 2 (two) times daily. 05/28/19  Yes Clegg, Amy D, NP  TRUEplus Lancets 28G MISC Use as instructed. Check blood glucose level by fingerstick twice per day. 03/18/19  Yes [name], NP  pantoprazole (PROTONIX) 40 MG tablet Take 1 tablet (40 mg total) by mouth daily. Patient not taking: Reported on 05/13/2019 04/20/19   Kayleen Memos, DO    Scheduled Meds: . atorvastatin  20 mg Oral QHS  . docusate sodium  100 mg Oral BID  . hydrocortisone  25 mg Rectal Once  . insulin aspart  0-9 Units Subcutaneous Q4H  . labetalol  200 mg Oral BID  . pantoprazole  40 mg Oral Daily  . polyvinyl alcohol  1 drop Both Eyes Daily  . senna-docusate  2 tablet Oral Daily  . sodium chloride flush  3 mL Intravenous Q12H  . torsemide  80 mg Oral BID   Continuous Infusions: . sodium chloride     PRN Meds:.sodium chloride, acetaminophen **OR** acetaminophen, polyethylene glycol, sodium chloride flush  Allergies as of 05/28/2019  . (No  Known Allergies)    Family History  Problem Relation Age of Onset  . Hypertension Mother   . Diabetes Mother     Social History   Socioeconomic History  . Marital status: Married    Spouse name: Not on file  . Number of children: Not on file  . Years of education: Not on file  . Highest education level: Not on file  Occupational History  . Not on file  Social Needs  . Financial resource strain: Not on file  . Food insecurity    Worry: Not on file    Inability: Not on file  . Transportation needs    Medical: Not on file    Non-medical: Not on file  Tobacco Use  . Smoking status: Former Research scientist (life sciences)  . Smokeless tobacco: Never Used  Substance and Sexual Activity  . Alcohol use: Not Currently  . Drug  use: Not Currently  . Sexual activity: Not Currently  Lifestyle  . Physical activity    Days per week: Not on file    Minutes per session: Not on file  . Stress: Not on file  Relationships  . Social Herbalist on phone: Not on file    Gets together: Not on file    Attends religious service: Not on file    Active member of club or organization: Not on file    Attends meetings of clubs or organizations: Not on file    Relationship status: Not on file  . Intimate partner violence    Fear of current or ex partner: Not on file    Emotionally abused: Not on file    Physically abused: Not on file    Forced sexual activity: Not on file  Other Topics Concern  . Not on file  Social History Narrative  . Not on file    Review of Systems:Review of Systems  Constitutional: Negative for chills and fever.  HENT: Negative for hearing loss and tinnitus.   Eyes: Negative for blurred vision and double vision.  Respiratory: Negative for cough and hemoptysis.   Cardiovascular: Negative for chest pain and palpitations.  Gastrointestinal: Positive for blood in stool and constipation. Negative for abdominal pain, diarrhea, heartburn, nausea and vomiting.  Genitourinary: Negative  for dysuria and urgency.  Musculoskeletal: Negative for myalgias and neck pain.  Skin: Negative for itching and rash.  Neurological: Negative for seizures and loss of consciousness.  Endo/Heme/Allergies: Bruises/bleeds easily.  Psychiatric/Behavioral: Negative for substance abuse and suicidal ideas.    Physical Exam: Vital signs: Vitals:   05/28/19 2145 05/29/19 0326  BP: 125/73 126/81  Pulse: 91 84  Resp: (!) 23 17  Temp: 98.7 F (37.1 C) 98.4 F (36.9 C)  SpO2: 96% 97%   Last BM Date: 05/28/19 Physical Exam  Constitutional: He is oriented to person, place, and time. He appears well-developed and well-nourished. No distress.  HENT:  Head: Normocephalic and atraumatic.  Eyes: EOM are normal. No scleral icterus.  Neck: Normal range of motion. Neck supple.  Cardiovascular: Normal rate, regular rhythm and normal heart sounds.  Pulmonary/Chest: Effort normal and breath sounds normal. No respiratory distress.  Abdominal: Soft. Bowel sounds are normal. He exhibits no distension. There is no abdominal tenderness. There is no rebound and no guarding.  Obese abdomen with difficult exam  Musculoskeletal: Normal range of motion.        General: Edema present.  Neurological: He is alert and oriented to person, place, and time.  Skin: Skin is dry. No erythema.  Psychiatric: He has a normal mood and affect. Judgment and thought content normal.  Vitals reviewed.   GI:  Lab Results: Recent Labs    05/28/19 2132 05/29/19 0153 05/29/19 1145  WBC 10.2 10.8* 9.6  HGB 7.6* 7.3* 7.7*  HCT 25.3* 24.9* 25.7*  PLT 425* 423* 441*   BMET Recent Labs    05/28/19 1108 05/29/19 0153  NA 138 138  K 3.4* 3.3*  CL 103 99  CO2 27 28  GLUCOSE 105* 105*  BUN 12 9  CREATININE 1.34* 1.36*  CALCIUM 8.5* 8.2*   LFT Recent Labs    05/29/19 0153  PROT 6.9  ALBUMIN 3.4*  AST 19  ALT 16  ALKPHOS 42  BILITOT 0.2*   PT/INR Recent Labs    05/29/19 0835  LABPROT 13.6  INR 1.1      Studies/Results: No results found.  Impression/Plan: -Intermittent rectal bleeding.  Mostly after bowel movement.  Colonoscopy in June 2020 showed internal and external hemorrhoids.  No evidence of active bleeding at that time. -Acute on chronic blood loss anemia -History of atrial fibrillation.  On Xarelto -Constipation  Recommendations ------------------------- -Most likely slow blood loss from hemorrhoids. -Increase Anusol suppository to twice a day. -Avoid constipation.  Sitz bath. -Okay to start soft diet.  No plan for endoscopic intervention at this time. -Okay to resume anticoagulation from GI standpoint. -Recheck CBC in the morning.    LOS: 0 days   Otis Brace  MD, FACP 05/29/2019, 1:06 PM  Contact #  [phone]

## 2019-05-30 ENCOUNTER — Telehealth (HOSPITAL_COMMUNITY): Payer: Self-pay | Admitting: *Deleted

## 2019-05-30 DIAGNOSIS — K922 Gastrointestinal hemorrhage, unspecified: Secondary | ICD-10-CM

## 2019-05-30 LAB — CBC
HCT: 25.4 % — ABNORMAL LOW (ref 39.0–52.0)
Hemoglobin: 7.5 g/dL — ABNORMAL LOW (ref 13.0–17.0)
MCH: 22.9 pg — ABNORMAL LOW (ref 26.0–34.0)
MCHC: 29.5 g/dL — ABNORMAL LOW (ref 30.0–36.0)
MCV: 77.4 fL — ABNORMAL LOW (ref 80.0–100.0)
Platelets: 419 10*3/uL — ABNORMAL HIGH (ref 150–400)
RBC: 3.28 MIL/uL — ABNORMAL LOW (ref 4.22–5.81)
RDW: 16.7 % — ABNORMAL HIGH (ref 11.5–15.5)
WBC: 9.3 10*3/uL (ref 4.0–10.5)
nRBC: 0 % (ref 0.0–0.2)

## 2019-05-30 LAB — IRON AND TIBC
Iron: 14 ug/dL — ABNORMAL LOW (ref 45–182)
Saturation Ratios: 4 % — ABNORMAL LOW (ref 17.9–39.5)
TIBC: 377 ug/dL (ref 250–450)
UIBC: 363 ug/dL

## 2019-05-30 LAB — GLUCOSE, CAPILLARY
Glucose-Capillary: 124 mg/dL — ABNORMAL HIGH (ref 70–99)
Glucose-Capillary: 110 mg/dL — ABNORMAL HIGH (ref 70–99)
Glucose-Capillary: 111 mg/dL — ABNORMAL HIGH (ref 70–99)

## 2019-05-30 LAB — BASIC METABOLIC PANEL
Anion gap: 9 (ref 5–15)
BUN: 10 mg/dL (ref 6–20)
CO2: 31 mmol/L (ref 22–32)
Calcium: 7.9 mg/dL — ABNORMAL LOW (ref 8.9–10.3)
Chloride: 96 mmol/L — ABNORMAL LOW (ref 98–111)
Creatinine, Ser: 1.27 mg/dL — ABNORMAL HIGH (ref 0.61–1.24)
GFR calc Af Amer: >60 mL/min (ref >60)
GFR calc non Af Amer: >60 mL/min (ref >60)
Glucose, Bld: 122 mg/dL — ABNORMAL HIGH (ref 70–99)
Potassium: 3.1 mmol/L — ABNORMAL LOW (ref 3.5–5.1)
Sodium: 136 mmol/L (ref 135–145)

## 2019-05-30 LAB — FERRITIN: Ferritin: 13 ng/mL — ABNORMAL LOW (ref 24–336)

## 2019-05-30 MED ORDER — SODIUM CHLORIDE 0.9 % IV SOLN
510.0000 mg | Freq: Once | INTRAVENOUS | Status: AC
  Administered 2019-05-30: 510 mg via INTRAVENOUS
  Filled 2019-05-30: qty 17

## 2019-05-30 MED ORDER — DOCUSATE SODIUM 100 MG PO CAPS
200.0000 mg | ORAL_CAPSULE | Freq: Two times a day (BID) | ORAL | Status: DC
  Administered 2019-05-30: 10:00:00 200 mg via ORAL
  Filled 2019-05-30: qty 2

## 2019-05-30 MED ORDER — SENNOSIDES-DOCUSATE SODIUM 8.6-50 MG PO TABS: 2.0000 | ORAL_TABLET | Freq: Two times a day (BID) | ORAL | 3 refills | Status: AC

## 2019-05-30 MED ORDER — HYDROCORTISONE ACETATE 25 MG RE SUPP: 25.0000 mg | Freq: Two times a day (BID) | RECTAL | 0 refills | Status: DC

## 2019-05-30 NOTE — Telephone Encounter (Addendum)
Previous goals: LONG TERM:  - Weight loss (300 lbs)  - "Get my breath back"  - "Get my life back, get on my own again"  - "Chill out"   SHORT TERM: - Lose weight and improve breathing (330 lbs)  - He wants to walk around the block in 2 months (November). - Continue walking ramp for 4x- 3 days  - Continue walking stairs for 5x- 2 days   - Journal/document daily  o Rate leg burning "crying out" 10/10 with stair climbing  o Feelings with diet and exercise regimen  o Achieved? (if no, and why not and how do you feel about not achieving?)   - Continue with diet improvement-receiving guidance elsewhere in this area - Weigh every day  - No sodas allowed in living facility  Progress notes:   Patient answered the phone short of breath and explained that he just returned from the hospital as he admitted and treated for a GI bleed. He stated in the last week he had a setback and confessed he had a "relapse" and defined this with his eating habits and nutrition. It was a holiday weekend and his owner of his facility brought pizza and soda into the facility as the woman that cooks was off for the weekend. He is frustrated and states he is mad and doesn't want to mess up anymore. Says he "isn't gong to act like that anymore, because "my legs swell and they hurt." He did not achieve any of his goals. He said he did nothing but sit around and eat. He took full responsibility for not sticking to his action plan.    Achievements:  - He is feeling aware of his relapse and is using this as a reason and tool for when he feels weak in temptation again.   Challenges/Frustrations:  - He feels regretful because he got sick - "it was a terrible thing I did" - Patient was admitted to the hospital as a result - Patient's clinical team advised he rest and not exercise for a few days until he knows he doesn't have any side effects (can resume Saturday if he feels better)   Opportunities: - He wants to take a  "chill" with relapsing  o Avoiding temptation of the bad foods o Remember the pain and discomfort of being stuck and having to receive medical care o Return to exercising to feel better once cleared  Goals:  - Lose weight and improve breathing (330 lbs)  - He wants to walk around the block in 2 months (November). - Since hospitalized will back walking ramp for 3x- 2 days, and walk stairs for 3x- 2 days o WILL REST FOR 2 DAYS BEFORE RESUMIMG AND ONLY IF HE FEELS BETTER   - Journal/document daily  o Rate leg burning "crying out" 10/10 with stair climbing  o Feelings with diet and exercise regimen  o Achieved? (if no, and why not and how do you feel about not achieving?)   - Continue with diet improvement-receiving guidance elsewhere in this area - Weigh every day  - No sodas allowed in living facility-do better than next week (no relapses)  Summary:  Benjamin [name] was very honest in explaining his events in the last week. He expressed his frustration with himself and came to the realization that he was going to use that to go forward. He requested to continue the following and to circle back around next week. He will take it easier as he has a  new treatment plan and was advised by his physician to take it easier.  CEP will follow next week with a telephone encounter. All agreeable with plan.      Lesia Hausen, MS, ACSM-RCEP Clinical Exercise Physiologist

## 2019-05-30 NOTE — Progress Notes (Signed)
Pt complains of short of breath. Vital signs within normal limits. O2 at 2 L/ min administered.  Pt refused lab draw at this time.

## 2019-05-30 NOTE — Discharge Instructions (Signed)
Follow with Primary MD [name], NP in 7 days   Get CBC, CMP,  checked  by Primary MD next visit.    Activity: As tolerated with Full fall precautions use walker/cane & assistance as needed   Disposition Home    Diet: Heart Healthy   For Heart failure patients - Check your Weight same time everyday, if you gain over 2 pounds, or you develop in leg swelling, experience more shortness of breath or chest pain, call your Primary MD immediately. Follow Cardiac Low Salt Diet and 1.5 lit/day fluid restriction.   On your next visit with your primary care physician please Get Medicines reviewed and adjusted.   Please request your Prim.MD to go over all Hospital Tests and Procedure/Radiological results at the follow up, please get all Hospital records sent to your Prim MD by signing hospital release before you go home.   If you experience worsening of your admission symptoms, develop shortness of breath, life threatening emergency, suicidal or homicidal thoughts you must seek medical attention immediately by calling 911 or calling your MD immediately  if symptoms less severe.  You Must read complete instructions/literature along with all the possible adverse reactions/side effects for all the Medicines you take and that have been prescribed to you. Take any new Medicines after you have completely understood and accpet all the possible adverse reactions/side effects.   Do not drive, operating heavy machinery, perform activities at heights, swimming or participation in water activities or provide baby sitting services if your were admitted for syncope or siezures until you have seen by Primary MD or a Neurologist and advised to do so again.  Do not drive when taking Pain medications.    Do not take more than prescribed Pain, Sleep and Anxiety Medications  Special Instructions: If you have smoked or chewed Tobacco  in the last 2 yrs please stop smoking, stop any regular Alcohol  and or  any Recreational drug use.  Wear Seat belts while driving.   Please note  You were cared for by a hospitalist during your hospital stay. If you have any questions about your discharge medications or the care you received while you were in the hospital after you are discharged, you can call the unit and asked to speak with the hospitalist on call if the hospitalist that took care of you is not available. Once you are discharged, your primary care physician will handle any further medical issues. Please note that NO REFILLS for any discharge medications will be authorized once you are discharged, as it is imperative that you return to your primary care physician (or establish a relationship with a primary care physician if you do not have one) for your aftercare needs so that they can reassess your need for medications and monitor your lab values.

## 2019-05-30 NOTE — Discharge Summary (Signed)
Benjamin [name], is a 53 y.o. male  DOB 01-May-1966  MRN [id number].  Admission date:  05/28/2019  Admitting Physician  Jani Gravel, MD  Discharge Date:  05/30/2019   Primary MD  [name], NP  Recommendations for primary care physician for things to follow:  -Please check CBC, BMP during next visit to ensure stable hemoglobin   Admission Diagnosis  Gastrointestinal hemorrhage with melena [K92.1] Symptomatic anemia [D64.9]   Discharge Diagnosis  Gastrointestinal hemorrhage with melena [K92.1] Symptomatic anemia [D64.9]    Principal Problem:   Anemia Active Problems:   Diabetes mellitus type 2 in obese (HCC)   Chronic diastolic CHF (congestive heart failure) (HCC)   PAF (paroxysmal atrial fibrillation) (HCC)   Rectal bleeding   OSA on CPAP   Hypokalemia   GI bleed      Past Medical History:  Diagnosis Date  . A-fib ([place])   . Anemia 05/29/2019  . CHF (congestive heart failure) ([place])   . Diabetes mellitus without complication ([place])   . Elevated troponin 02/06/2019  . Hypertension   . Obesity   . Pulmonary embolism ([place])   . Rectal bleeding 05/29/2019  . Sleep apnea     Past Surgical History:  Procedure Laterality Date  . COLONOSCOPY WITH PROPOFOL Left 02/25/2019   Procedure: COLONOSCOPY WITH PROPOFOL;  Surgeon: Arta Silence, MD;  Location: [facility];  Service: Endoscopy;  Laterality: Left;  . ESOPHAGOGASTRODUODENOSCOPY (EGD) WITH PROPOFOL Left 02/25/2019   Procedure: ESOPHAGOGASTRODUODENOSCOPY (EGD) WITH PROPOFOL;  Surgeon: Arta Silence, MD;  Location[facility] ENDOSCOPY;  Service: Endoscopy;  Laterality: Left;  . NO PAST SURGERIES    . RIGHT HEART CATH N/A 04/17/2019   Procedure: RIGHT HEART CATH;  Surgeon: Larey Dresser, MD;  Location: [place] CV LAB;  Service: Cardiovascular;  Laterality: N/A;       History of present illness and  Hospital Course:     Kindly see  H&P for history of present illness and admission details, please review complete Labs, Consult reports and Test reports for all details in brief  HPI  from the history and physical done on the day of admission 05/28/2019   Benjamin [name]  is a 53 y.o. male, w hypertension, Dm2, Pafib, h/o elevated troponin, h/o PE (01/21/2019), Covid -19 positive 02/06/2019, OSA, apparently presents with c/o rectal bleeding for the past 4 weeks intermittently.  Pt was sent b cardiology for abnomally low Hgb.  7.1   Pt states that has had occasional LLQ pain intermittent worse with food or with bowel movement.  Currently not having pain.  Having loose stool.   Pt denies fever, chills, cough, cp, palp, sob, n/v, constipation, dysuria, hematuria.  Pt notes has had prior EGD/ colonoscopy on 02/25/2019 -> hemorrhoids, otherwise negative  In ED,  T 99.2, P 92, R 20 Bp 134/50  Pox 99% on RA  Wbc 8.6, hgb 7.1, Plt 418 Na 138, K 3.4, Bun 12, Creatinine 1.34  BNP 25.1 FOBT positive  GI (? [place]), consulted by ED, and  stated that they will consult on the patient  Pt will be admitted for rectal bleeding and symptomatic anemia.     Hospital Course    Bright red blood per rectum/rectal bleeding/acute blood loss anemia -Required 1 unit PRBC transfusion, his hemoglobin has been stable since transfusion, he was monitored closely given the fact he was symptomatic with his anemia as he was significantly dyspneic, as well he is on full anticoagulation with Xarelto given his history of PE, GI input greatly appreciated, no procedure anticipated during hospital stay, he was monitored closely, hemoglobin is stable over last 48 hours, this morning he had no further hemorrhoidal bleed, recommendation to continue with good bowel regimen and avoid constipation. -His work-up significant for iron deficiency anemia, he received IV Feraheme before discharge, he will continue his oral iron supplements on discharge as well.  H/o PE.   -Resume Xarelto  Pafib EKG with nsr.  Continue with Xarelto o. -Cont Labetalol 258m po bid  H/o CHF (diastolic) appears compensated.  -Cont Labetalol as above -Cont Torsemide 857mpo bid -daily weight -intake and output  Hypertension  -Resume home meds on discharge  Gerd Cont Protonix  Dm2. -Resume home meds on discharge  Constipation Cont Colace Cont Senna -docusate Cont Miralax prn  OSA on Bipap Cont Bipap   Hyperkalemia.  - repleted     Discharge Condition: stable   Follow UP  Follow-up Information    FlGildardo PoundsNP Follow up in 1 week(s).   Specialty: Nurse Practitioner Contact information: [id number] [phone]            Discharge Instructions  and  Discharge Medications    Discharge Instructions    Discharge instructions   Complete by: As directed    Follow with Primary MD FlGildardo PoundsNP in 7 days   Get CBC, CMP,  checked  by Primary MD next visit.    Activity: As tolerated with Full fall precautions use walker/cane & assistance as needed   Disposition Home    Diet: Heart Healthy   For Heart failure patients - Check your Weight same time everyday, if you gain over 2 pounds, or you develop in leg swelling, experience more shortness of breath or chest pain, call your Primary MD immediately. Follow Cardiac Low Salt Diet and 1.5 lit/day fluid restriction.   On your next visit with your primary care physician please Get Medicines reviewed and adjusted.   Please request your Prim.MD to go over all Hospital Tests and Procedure/Radiological results at the follow up, please get all Hospital records sent to your Prim MD by signing hospital release before you go home.   If you experience worsening of your admission symptoms, develop shortness of breath, life threatening emergency, suicidal or homicidal thoughts you must seek medical attention immediately by calling 911 or calling your  MD immediately  if symptoms less severe.  You Must read complete instructions/literature along with all the possible adverse reactions/side effects for all the Medicines you take and that have been prescribed to you. Take any new Medicines after you have completely understood and accpet all the possible adverse reactions/side effects.   Do not drive, operating heavy machinery, perform activities at heights, swimming or participation in water activities or provide baby sitting services if your were admitted for syncope or siezures until you have seen by Primary MD or a Neurologist and advised to do so again.  Do not drive when taking Pain medications.  Do not take more than prescribed Pain, Sleep and Anxiety Medications  Special Instructions: If you have smoked or chewed Tobacco  in the last 2 yrs please stop smoking, stop any regular Alcohol  and or any Recreational drug use.  Wear Seat belts while driving.   Please note  You were cared for by a hospitalist during your hospital stay. If you have any questions about your discharge medications or the care you received while you were in the hospital after you are discharged, you can call the unit and asked to speak with the hospitalist on call if the hospitalist that took care of you is not available. Once you are discharged, your primary care physician will handle any further medical issues. Please note that NO REFILLS for any discharge medications will be authorized once you are discharged, as it is imperative that you return to your primary care physician (or establish a relationship with a primary care physician if you do not have one) for your aftercare needs so that they can reassess your need for medications and monitor your lab values.   Increase activity slowly   Complete by: As directed      Allergies as of 05/30/2019   No Known Allergies     Medication List    STOP taking these medications   pantoprazole 40 MG tablet Commonly  known as: PROTONIX     TAKE these medications   albuterol 108 (90 Base) MCG/ACT inhaler Commonly known as: VENTOLIN HFA Inhale 2 puffs into the lungs every 6 (six) hours as needed for wheezing or shortness of breath.   amLODipine 10 MG tablet Commonly known as: NORVASC Take 1 tablet (10 mg total) by mouth daily.   atorvastatin 20 MG tablet Commonly known as: LIPITOR Take 1 tablet (20 mg total) by mouth at bedtime.   carboxymethylcellulose 0.5 % Soln Commonly known as: REFRESH PLUS Place 1 drop into both eyes daily.   docusate sodium 100 MG capsule Commonly known as: COLACE Take 1 capsule (100 mg total) by mouth 2 (two) times daily.   ferrous sulfate 325 (65 FE) MG tablet Take 1 tablet (325 mg total) by mouth daily. Patient will pick up scripts today.   hydrocortisone 25 MG suppository Commonly known as: ANUSOL-HC Place 1 suppository (25 mg total) rectally 2 (two) times daily. What changed:   when to take this  reasons to take this   labetalol 200 MG tablet Commonly known as: NORMODYNE Take 1 tablet (200 mg total) by mouth 2 (two) times daily.   levalbuterol 45 MCG/ACT inhaler Commonly known as: XOPENEX HFA Inhale 2 puffs into the lungs every 4 (four) hours as needed for wheezing.   metFORMIN 500 MG tablet Commonly known as: GLUCOPHAGE Take 1 tablet (500 mg total) by mouth 2 (two) times a day. Patient will pick up scripts today. What changed: when to take this   omeprazole 20 MG capsule Commonly known as: PRILOSEC Take 1 capsule (20 mg total) by mouth daily.   polyethylene glycol 17 g packet Commonly known as: MIRALAX / GLYCOLAX Take 17 g by mouth daily as needed for severe constipation.   potassium chloride 10 MEQ tablet Commonly known as: K-DUR Take 2 tablets (20 mEq total) by mouth daily.   rivaroxaban 20 MG Tabs tablet Commonly known as: XARELTO Take 1 tablet (20 mg total) by mouth daily with supper.   senna-docusate 8.6-50 MG tablet Commonly known  as: Senokot-S Take 2 tablets by mouth 2 (two) times daily. What  changed: when to take this   torsemide 20 MG tablet Commonly known as: DEMADEX Take 4 tablets (80 mg total) by mouth 2 (two) times daily.   True Metrix Blood Glucose Test test strip Generic drug: glucose blood Use as instructed. Check blood glucose level by fingerstick twice per day.   True Metrix Meter w/Device Kit Use as instructed. Check blood glucose level by fingerstick twice per day.   TRUEplus Lancets 28G Misc Use as instructed. Check blood glucose level by fingerstick twice per day.         Diet and Activity recommendation: See Discharge Instructions above   Consults obtained - GI   Major procedures and Radiology Reports - PLEASE review detailed and final reports for all details, in brief -     Dg Chest Portable 1 View  Result Date: 05/12/2019 CLINICAL DATA:  Chest pain and shortness of breath. EXAM: PORTABLE CHEST 1 VIEW COMPARISON:  04/11/2019. CT 02/26/2019 FINDINGS: Unchanged cardiomegaly. Unchanged mediastinal contours. Prominence of bronchovascular markings peers similar to prior exam. No evidence of new airspace disease. No large pleural effusion or pneumothorax. Detailed evaluation limited by soft tissue attenuation from habitus. IMPRESSION: Stable cardiomegaly and prominence of bronchovascular markings. No new abnormality is seen. Electronically Signed   By: Keith Rake M.D.   On: 05/12/2019 02:18    Micro Results     Recent Results (from the past 240 hour(s))  SARS Coronavirus 2 [facility] order, Performed in [facility] hospital lab) Nasopharyngeal Nasopharyngeal Swab     Status: None   Collection Time: 05/28/19  7:42 PM   Specimen: Nasopharyngeal Swab  Result Value Ref Range Status   SARS Coronavirus 2 NEGATIVE NEGATIVE Final    Comment: (NOTE) If result is NEGATIVE SARS-CoV-2 target nucleic acids are NOT DETECTED. The SARS-CoV-2 RNA is generally detectable in upper and lower   respiratory specimens during the acute phase of infection. The lowest  concentration of SARS-CoV-2 viral copies this assay can detect is 250  copies / mL. A negative result does not preclude SARS-CoV-2 infection  and should not be used as the sole basis for treatment or other  patient management decisions.  A negative result may occur with  improper specimen collection / handling, submission of specimen other  than nasopharyngeal swab, presence of viral mutation(s) within the  areas targeted by this assay, and inadequate number of viral copies  (<250 copies / mL). A negative result must be combined with clinical  observations, patient history, and epidemiological information. If result is POSITIVE SARS-CoV-2 target nucleic acids are DETECTED. The SARS-CoV-2 RNA is generally detectable in upper and lower  respiratory specimens dur ing the acute phase of infection.  Positive  results are indicative of active infection with SARS-CoV-2.  Clinical  correlation with patient history and other diagnostic information is  necessary to determine patient infection status.  Positive results do  not rule out bacterial infection or co-infection with other viruses. If result is PRESUMPTIVE POSTIVE SARS-CoV-2 nucleic acids MAY BE PRESENT.   A presumptive positive result was obtained on the submitted specimen  and confirmed on repeat testing.  While 2019 novel coronavirus  (SARS-CoV-2) nucleic acids may be present in the submitted sample  additional confirmatory testing may be necessary for epidemiological  and / or clinical management purposes  to differentiate between  SARS-CoV-2 and other Sarbecovirus currently known to infect humans.  If clinically indicated additional testing with an alternate test  methodology [phone]) is advised. The SARS-CoV-2 RNA is generally  detectable in upper and lower respiratory sp ecimens during the acute  phase of infection. The expected result is Negative. Fact  Sheet for Patients:  StrictlyIdeas.no Fact Sheet for Healthcare Providers: BankingDealers.co.za This test is not yet approved or cleared by the [place] FDA and has been authorized for detection and/or diagnosis of SARS-CoV-2 by FDA under an Emergency Use Authorization (EUA).  This EUA will remain in effect (meaning this test can be used) for the duration of the COVID-19 declaration under Section 564(b)(1) of the Act, 21 U.S.C. section 360bbb-3(b)(1), unless the authorization is terminated or revoked sooner. Performed at [facility], [place] [address] [place], [place] [address]        Today   Subjective:   Benjamin [name] today has no headache, no chest pain, no abdominal pain, patient had bowel movement x2 today without any melena or bright red blood .  Objective:   Blood pressure (!) 135/95, pulse 86, temperature 98.4 F (36.9 C), temperature source Oral, resp. rate 18, height _0  (1.803 m), weight (!) 185.1 kg, SpO2 96 %.   Intake/Output Summary (Last 24 hours) at 05/30/2019 1349 Last data filed at 05/30/2019 0900 Gross per 24 hour  Intake 900 ml  Output -  Net 900 ml    Exam Awake Alert, Oriented x 3, No new F.N deficits, Normal affect Symmetrical Chest wall movement, Good air movement bilaterally, CTAB RRR,No Gallops,Rubs or new Murmurs, No Parasternal Heave +ve B.Sounds, Abd Soft, Non tender, No rebound -guarding or rigidity. No Cyanosis, Clubbing or edema, No new Rash or bruise  Data Review   CBC w Diff:  Lab Results  Component Value Date   WBC 9.3 05/30/2019   HGB 7.5 (L) 05/30/2019   HCT 25.4 (L) 05/30/2019   PLT 419 (H) 05/30/2019   LYMPHOPCT 34 04/18/2019   MONOPCT 11 04/18/2019   EOSPCT 6 04/18/2019   BASOPCT 0 04/18/2019    CMP:  Lab Results  Component Value Date   NA 136 05/30/2019   K 3.1 (L) 05/30/2019   CL 96 (L) 05/30/2019   CO2 31 05/30/2019   BUN 10 05/30/2019   CREATININE  1.27 (H) 05/30/2019   PROT 6.9 05/29/2019   ALBUMIN 3.4 (L) 05/29/2019   BILITOT 0.2 (L) 05/29/2019   ALKPHOS 42 05/29/2019   AST 19 05/29/2019   ALT 16 05/29/2019  .   Total Time in preparing paper work, data evaluation and todays exam - 40 minutes  Phillips Climes M.D on 05/30/2019 at 1:49 PM  [facility]   Office  [phone]

## 2019-05-31 ENCOUNTER — Telehealth: Payer: Self-pay

## 2019-05-31 NOTE — Telephone Encounter (Signed)
Transition Care Management Follow-up Telephone Call  Date of discharge and from where: 05/30/2019. [facility]   How have you been since you were released from the hospital? stated that he is feeling " pretty good."   Any questions or concerns? His only question was regarding transportation to his appointments on 06/04/2019. He said that he will be calling Quentin Mulling about scheduling a cab.   Denied any bleeding.   Items Reviewed:  Did the pt receive and understand the discharge instructions provided? He said that he has the instructions and doesn't have any questions.   Medications obtained and verified? He does not have the anusol or senokot and said that [place], EMT will pick them up at the pharmacy for him.  Informed him that as per discharge AVS, the instructions indicate to stop the pantoprazole.  This information he had Ms Burnett Harry write down for him. He said that Katie sets up his medications for him and he is taking all as ordered.   Any new allergies since your discharge?  none reported   Do you have support at home? Yes, he continues to live at the halfway house.   Other (ie: DME, Home Health, etc) [name].EMT /Community paramedicine is following patient at home'  He has a glucometer. Says he checks his blood sugar daily. This morning it was 110.  Has scale , weighs daily and keeps log  Has O2, uses at 2L continuously  Has applied for medicaid, currently uninsured.   Stated he follows 1.5L/day fluid restriction.  Noting that " I have to do it."   Has BiPAP  Functional Questionnaire: (I = Independent and D = Dependent) ADL's: independent. Staff can provide assistance if needed.  Occasionally uses a cane.   Follow up appointments reviewed:    PCP Hospital f/u appt confirmed? 06/04/2019 @ 1530 at [facility]. He was informed that it was an in person appointment.   [facility] f/u appt confirmed?   Cardiology appointment @ 1400 prior to PCP appointment  on 06/04/2019.  He said that he will be at both appointments and did not want to change PCP appt.   Are transportation arrangements needed? Yes, he said that he will contact [name], LCSW/cardiology on Monday 06/03/2019 to arrange for a cab.   If their condition worsens, is the pt aware to call  their PCP or go to the ED? yes  Was the patient provided with contact information for the PCP's office or ED? He has the phone number for the clinic  Was the pt encouraged to call back with questions or concerns? Yes

## 2019-06-03 ENCOUNTER — Other Ambulatory Visit (HOSPITAL_COMMUNITY): Payer: Self-pay

## 2019-06-03 ENCOUNTER — Telehealth (HOSPITAL_COMMUNITY): Payer: Self-pay

## 2019-06-03 ENCOUNTER — Telehealth (HOSPITAL_COMMUNITY): Payer: Self-pay | Admitting: Licensed Clinical Social Worker

## 2019-06-03 NOTE — Progress Notes (Signed)
Paramedicine Encounter    Patient ID: Benjamin [name], male    DOB: 10/10/1965, 53 y.o.   MRN: [id number]   Patient Care Team: [name], NP as PCP - General (Nurse Practitioner) [name], MD as PCP - Cardiology (Cardiology) [name], LCSW as Social Worker (Licensed Clinical Social Worker)  Patient Active Problem List   Diagnosis Date Noted  . Hypokalemia 05/29/2019  . GI bleed 05/29/2019  . Anemia 05/28/2019  . Acute on chronic diastolic CHF (congestive heart failure) ([place]) 04/11/2019  . Iron deficiency anemia 03/11/2019  . OSA on CPAP 03/11/2019  . HLD (hyperlipidemia) 03/11/2019  . Elevated troponin 03/11/2019  . GERD (gastroesophageal reflux disease) 03/11/2019  . Rectal bleeding 02/20/2019  . Dyspnea 02/06/2019  . COVID-19 virus infection 02/06/2019  . Bilateral lower extremity edema   . Obesity, Class III, BMI 40-49.9 (morbid obesity) ([place])   . Chronic diastolic CHF (congestive heart failure) ([place])   . PAF (paroxysmal atrial fibrillation) ([place])   . PE (pulmonary thromboembolism) ([place]) 01/21/2019  . Hypertensive urgency 01/21/2019  . Diabetes mellitus type 2 in obese ([place]) 01/21/2019    Current Outpatient Medications:  .  albuterol (VENTOLIN HFA) 108 (90 Base) MCG/ACT inhaler, Inhale 2 puffs into the lungs every 6 (six) hours as needed for wheezing or shortness of breath. , Disp: , Rfl:  .  amLODipine (NORVASC) 10 MG tablet, Take 1 tablet (10 mg total) by mouth daily., Disp: 60 tablet, Rfl: 0 .  atorvastatin (LIPITOR) 20 MG tablet, Take 1 tablet (20 mg total) by mouth at bedtime., Disp: 60 tablet, Rfl: 0 .  Blood Glucose Monitoring Suppl (TRUE METRIX METER) w/Device KIT, Use as instructed. Check blood glucose level by fingerstick twice per day., Disp: 1 kit, Rfl: 0 .  carboxymethylcellulose (REFRESH PLUS) 0.5 % SOLN, Place 1 drop into both eyes daily. , Disp: , Rfl:  .  docusate sodium (COLACE) 100 MG capsule, Take 1 capsule (100 mg total) by mouth 2  (two) times daily., Disp: 60 capsule, Rfl: 2 .  ferrous sulfate 325 (65 FE) MG tablet, Take 1 tablet (325 mg total) by mouth daily. Patient will pick up scripts today., Disp: 90 tablet, Rfl: 0 .  glucose blood (TRUE METRIX BLOOD GLUCOSE TEST) test strip, Use as instructed. Check blood glucose level by fingerstick twice per day., Disp: 100 each, Rfl: 12 .  labetalol (NORMODYNE) 200 MG tablet, Take 1 tablet (200 mg total) by mouth 2 (two) times daily., Disp: 120 tablet, Rfl: 0 .  levalbuterol (XOPENEX HFA) 45 MCG/ACT inhaler, Inhale 2 puffs into the lungs every 4 (four) hours as needed for wheezing. , Disp: , Rfl:  .  metFORMIN (GLUCOPHAGE) 500 MG tablet, Take 1 tablet (500 mg total) by mouth 2 (two) times a day. Patient will pick up scripts today. (Patient taking differently: Take 500 mg by mouth 2 (two) times daily with a meal. Patient will pick up scripts today.), Disp: 180 tablet, Rfl: 0 .  omeprazole (PRILOSEC) 20 MG capsule, Take 1 capsule (20 mg total) by mouth daily., Disp: 30 capsule, Rfl: 3 .  polyethylene glycol (MIRALAX / GLYCOLAX) 17 g packet, Take 17 g by mouth daily as needed for severe constipation., Disp: 14 each, Rfl: 0 .  potassium chloride (K-DUR) 10 MEQ tablet, Take 2 tablets (20 mEq total) by mouth daily., Disp: 60 tablet, Rfl: 0 .  rivaroxaban (XARELTO) 20 MG TABS tablet, Take 1 tablet (20 mg total) by mouth daily with supper., Disp:  60 tablet, Rfl: 0 .  torsemide (DEMADEX) 20 MG tablet, Take 4 tablets (80 mg total) by mouth 2 (two) times daily., Disp: 240 tablet, Rfl: 6 .  TRUEplus Lancets 28G MISC, Use as instructed. Check blood glucose level by fingerstick twice per day., Disp: 200 each, Rfl: 3 .  hydrocortisone (ANUSOL-HC) 25 MG suppository, Place 1 suppository (25 mg total) rectally 2 (two) times daily. (Patient not taking: Reported on 06/03/2019), Disp: 12 suppository, Rfl: 0 .  senna-docusate (SENOKOT-S) 8.6-50 MG tablet, Take 2 tablets by mouth 2 (two) times daily. (Patient  not taking: Reported on 06/03/2019), Disp: 60 tablet, Rfl: 3 No Known Allergies    Social History   Socioeconomic History  . Marital status: Married    Spouse name: Not on file  . Number of children: Not on file  . Years of education: Not on file  . Highest education level: Not on file  Occupational History  . Not on file  Social Needs  . Financial resource strain: Not on file  . Food insecurity    Worry: Not on file    Inability: Not on file  . Transportation needs    Medical: Not on file    Non-medical: Not on file  Tobacco Use  . Smoking status: Former Research scientist (life sciences)  . Smokeless tobacco: Never Used  Substance and Sexual Activity  . Alcohol use: Not Currently  . Drug use: Not Currently  . Sexual activity: Not Currently  Lifestyle  . Physical activity    Days per week: Not on file    Minutes per session: Not on file  . Stress: Not on file  Relationships  . Social Herbalist on phone: Not on file    Gets together: Not on file    Attends religious service: Not on file    Active member of club or organization: Not on file    Attends meetings of clubs or organizations: Not on file    Relationship status: Not on file  . Intimate partner violence    Fear of current or ex partner: Not on file    Emotionally abused: Not on file    Physically abused: Not on file    Forced sexual activity: Not on file  Other Topics Concern  . Not on file  Social History Narrative  . Not on file    Physical Exam      Future Appointments  Date Time Provider [place]  06/04/2019  2:00 PM [facility] PA/NP [facility] None  06/04/2019  3:30 PM [name], NP [facility] None  07/04/2019 11:45 AM [name], MD [facility] None    BP (!) 152/92   Pulse 80   Temp 97.7 F (36.5 C)   Resp 18   SpO2 96%  CBG EMS-111 Weight yesterday-399 Last visit weight-399  Pt recently back from the hosp admission of rectal bleeding. He states he still has had 13 BM since he has  been back and they have all been bloody. He feels terrible. He doesn't have f/u until 10/15 at GI. He states he feels like he is about to die when he has BM and feels so weak.  He states he even has quit eating b/c he doesn't want to have BM's anymore and he is just drinking fluids.  He has not been able to get the anusol picked up from pharmacy yet. He looks like he doesn't feel well. He is not his joking self.  I called [facility] and  spoke to scheduling person and she took message and my number.  He didn't even weigh this morning b/c he felt so bad and forgot about it.  I asked if he wanted to go back to hosp right now and he wants to wait for right now. He goes to CHF clinic and PCP tomor.  [place] and he has torsemide, anusol, metformin is ready for pick up. I will get those today and bring back for him.  Pharmacy tech asked I bring the blue card up there so they can update his chart--I asked if I could take pic of it and that was ok so I will take pic of his card and show them and delete it as soon as its done.  He did take his meds during our visit. I went to [place] to pick up his anusol, metformin and torsemide and took it back to him and I instructed him the directions for anusol and placed the torsemide in his pill box.  [facility] called me back and said they were not able to see him sooner than oct 15, but to ensure he f/u with PCP. So that he will do and hopes something can be done to stop the bleeding.   [name], [facility] Paramedic  06/03/19

## 2019-06-03 NOTE — Telephone Encounter (Signed)
CSW informed by community paramedic that patient had received confirmation that [employer] application had been received but that there was a question that was unanswered.  CSW filled out missing question and sent back in to [employer] for review.  CSW will continue to follow and assist as needed  Benjamin [name], [facility] Desk#: [phone] Cell#: [phone]

## 2019-06-03 NOTE — Telephone Encounter (Signed)
[facility] office called me back about moving up appointment, she was able to get a tele-visit sch for 9/16 @ 10am.  I advised her that when she calls that number to press 0 when the operator came on and they would get him to the phone.  I called pt to let him know and he will be ready for it and he told the personnel up front the time.   [name], EMT-Paramedic  06/03/19

## 2019-06-04 ENCOUNTER — Encounter: Payer: Self-pay | Admitting: Nurse Practitioner

## 2019-06-04 ENCOUNTER — Other Ambulatory Visit: Payer: Self-pay

## 2019-06-04 ENCOUNTER — Ambulatory Visit (HOSPITAL_BASED_OUTPATIENT_CLINIC_OR_DEPARTMENT_OTHER): Payer: Self-pay | Admitting: Pharmacist

## 2019-06-04 ENCOUNTER — Ambulatory Visit (HOSPITAL_COMMUNITY)
Admission: RE | Admit: 2019-06-04 | Discharge: 2019-06-04 | Disposition: A | Discharge status: Home / Self Care | Payer: Medicaid Other | Source: Ambulatory Visit | Attending: Cardiology | Admitting: Cardiology

## 2019-06-04 ENCOUNTER — Other Ambulatory Visit (HOSPITAL_COMMUNITY): Payer: Self-pay

## 2019-06-04 ENCOUNTER — Ambulatory Visit: Payer: Self-pay | Attending: Nurse Practitioner | Admitting: Nurse Practitioner

## 2019-06-04 ENCOUNTER — Telehealth: Payer: Self-pay

## 2019-06-04 ENCOUNTER — Encounter (HOSPITAL_COMMUNITY): Payer: Self-pay

## 2019-06-04 VITALS — BP 132/75 | HR 93 | Wt >= 6400 oz

## 2019-06-04 VITALS — BP 130/76 | HR 87 | Temp 99.0°F | Ht 71.0 in | Wt >= 6400 oz

## 2019-06-04 DIAGNOSIS — Z6841 Body Mass Index (BMI) 40.0 and over, adult: Secondary | ICD-10-CM | POA: Insufficient documentation

## 2019-06-04 DIAGNOSIS — I130 Hypertensive heart and chronic kidney disease with heart failure and stage 1 through stage 4 chronic kidney disease, or unspecified chronic kidney disease: Secondary | ICD-10-CM | POA: Diagnosis not present

## 2019-06-04 DIAGNOSIS — K219 Gastro-esophageal reflux disease without esophagitis: Secondary | ICD-10-CM | POA: Diagnosis not present

## 2019-06-04 DIAGNOSIS — N182 Chronic kidney disease, stage 2 (mild): Secondary | ICD-10-CM | POA: Diagnosis not present

## 2019-06-04 DIAGNOSIS — I480 Paroxysmal atrial fibrillation: Secondary | ICD-10-CM

## 2019-06-04 DIAGNOSIS — Z9989 Dependence on other enabling machines and devices: Secondary | ICD-10-CM

## 2019-06-04 DIAGNOSIS — K625 Hemorrhage of anus and rectum: Secondary | ICD-10-CM

## 2019-06-04 DIAGNOSIS — Z7984 Long term (current) use of oral hypoglycemic drugs: Secondary | ICD-10-CM | POA: Insufficient documentation

## 2019-06-04 DIAGNOSIS — Z23 Encounter for immunization: Secondary | ICD-10-CM

## 2019-06-04 DIAGNOSIS — I10 Essential (primary) hypertension: Secondary | ICD-10-CM

## 2019-06-04 DIAGNOSIS — G4733 Obstructive sleep apnea (adult) (pediatric): Secondary | ICD-10-CM

## 2019-06-04 DIAGNOSIS — I2699 Other pulmonary embolism without acute cor pulmonale: Secondary | ICD-10-CM

## 2019-06-04 DIAGNOSIS — Z79899 Other long term (current) drug therapy: Secondary | ICD-10-CM | POA: Diagnosis not present

## 2019-06-04 DIAGNOSIS — R0602 Shortness of breath: Secondary | ICD-10-CM | POA: Insufficient documentation

## 2019-06-04 DIAGNOSIS — Z87891 Personal history of nicotine dependence: Secondary | ICD-10-CM | POA: Diagnosis not present

## 2019-06-04 DIAGNOSIS — Z7901 Long term (current) use of anticoagulants: Secondary | ICD-10-CM | POA: Insufficient documentation

## 2019-06-04 DIAGNOSIS — Z86711 Personal history of pulmonary embolism: Secondary | ICD-10-CM | POA: Diagnosis not present

## 2019-06-04 DIAGNOSIS — D649 Anemia, unspecified: Secondary | ICD-10-CM | POA: Insufficient documentation

## 2019-06-04 DIAGNOSIS — Z8249 Family history of ischemic heart disease and other diseases of the circulatory system: Secondary | ICD-10-CM | POA: Diagnosis not present

## 2019-06-04 DIAGNOSIS — K921 Melena: Secondary | ICD-10-CM | POA: Diagnosis not present

## 2019-06-04 DIAGNOSIS — R0609 Other forms of dyspnea: Secondary | ICD-10-CM

## 2019-06-04 DIAGNOSIS — IMO0002 Reserved for concepts with insufficient information to code with codable children: Secondary | ICD-10-CM

## 2019-06-04 DIAGNOSIS — E876 Hypokalemia: Secondary | ICD-10-CM

## 2019-06-04 DIAGNOSIS — I5032 Chronic diastolic (congestive) heart failure: Secondary | ICD-10-CM

## 2019-06-04 DIAGNOSIS — E669 Obesity, unspecified: Secondary | ICD-10-CM | POA: Diagnosis not present

## 2019-06-04 DIAGNOSIS — E118 Type 2 diabetes mellitus with unspecified complications: Secondary | ICD-10-CM

## 2019-06-04 DIAGNOSIS — E782 Mixed hyperlipidemia: Secondary | ICD-10-CM

## 2019-06-04 DIAGNOSIS — E1165 Type 2 diabetes mellitus with hyperglycemia: Secondary | ICD-10-CM

## 2019-06-04 LAB — CBC
HCT: 27.1 % — ABNORMAL LOW (ref 39.0–52.0)
Hemoglobin: 7.8 g/dL — ABNORMAL LOW (ref 13.0–17.0)
MCH: 22.9 pg — ABNORMAL LOW (ref 26.0–34.0)
MCHC: 28.8 g/dL — ABNORMAL LOW (ref 30.0–36.0)
MCV: 79.7 fL — ABNORMAL LOW (ref 80.0–100.0)
Platelets: 408 10*3/uL — ABNORMAL HIGH (ref 150–400)
RBC: 3.4 MIL/uL — ABNORMAL LOW (ref 4.22–5.81)
RDW: 19.1 % — ABNORMAL HIGH (ref 11.5–15.5)
WBC: 9.5 10*3/uL (ref 4.0–10.5)
nRBC: 0 % (ref 0.0–0.2)

## 2019-06-04 LAB — BASIC METABOLIC PANEL
Anion gap: 8 (ref 5–15)
BUN: 12 mg/dL (ref 6–20)
CO2: 33 mmol/L — ABNORMAL HIGH (ref 22–32)
Calcium: 9 mg/dL (ref 8.9–10.3)
Chloride: 98 mmol/L (ref 98–111)
Creatinine, Ser: 1.31 mg/dL — ABNORMAL HIGH (ref 0.61–1.24)
GFR calc Af Amer: >60 mL/min (ref >60)
GFR calc non Af Amer: >60 mL/min (ref >60)
Glucose, Bld: 101 mg/dL — ABNORMAL HIGH (ref 70–99)
Potassium: 3.3 mmol/L — ABNORMAL LOW (ref 3.5–5.1)
Sodium: 139 mmol/L (ref 135–145)

## 2019-06-04 LAB — GLUCOSE, POCT (MANUAL RESULT ENTRY): POC Glucose: 85 mg/dl (ref 70–99)

## 2019-06-04 MED ORDER — ATORVASTATIN CALCIUM 20 MG PO TABS: 20.0000 mg | ORAL_TABLET | Freq: Every day | ORAL | 2 refills | Status: DC

## 2019-06-04 MED ORDER — HYDROCORTISONE ACETATE 25 MG RE SUPP: 25.0000 mg | Freq: Two times a day (BID) | RECTAL | 1 refills | Status: DC

## 2019-06-04 MED ORDER — POTASSIUM CHLORIDE ER 10 MEQ PO TBCR: 20.0000 meq | EXTENDED_RELEASE_TABLET | Freq: Every day | ORAL | 3 refills | Status: DC

## 2019-06-04 MED ORDER — HYDROCORTISONE ACETATE 25 MG RE SUPP: 25.0000 mg | Freq: Two times a day (BID) | RECTAL | 0 refills | Status: DC

## 2019-06-04 MED ORDER — POTASSIUM CHLORIDE CRYS ER 20 MEQ PO TBCR: 40.0000 meq | EXTENDED_RELEASE_TABLET | Freq: Every day | ORAL | 3 refills | Status: DC

## 2019-06-04 MED ORDER — METHOCARBAMOL 500 MG PO TABS: 500.0000 mg | ORAL_TABLET | Freq: Three times a day (TID) | ORAL | 1 refills | Status: AC

## 2019-06-04 MED ORDER — AMLODIPINE BESYLATE 10 MG PO TABS: 10.0000 mg | ORAL_TABLET | Freq: Every day | ORAL | 1 refills | Status: DC

## 2019-06-04 MED FILL — METHOCARBAMOL 500 MG TABS: 500 | 20 days supply | Qty: 60 | Fill #0

## 2019-06-04 MED FILL — POTASSIUM CHLORIDE ER 10 ME: 10 | 30 days supply | Qty: 60 | Fill #0

## 2019-06-04 MED FILL — HYDROCORTISONE ACETATE 25 M: 25 | 6 days supply | Qty: 12 | Fill #0

## 2019-06-04 NOTE — Telephone Encounter (Signed)
Met with the patient when he was in the clinic today. Cab transportation arranged for his ride home.  He said that he needs to have disability paperwork faxed... but did not have fax # or name of agency the paperwork needs to go to.  Instructed him to obtain name of agency and fax # and he can complete a release of information request

## 2019-06-04 NOTE — Progress Notes (Signed)
Assessment & Plan:  Reagan was seen today for follow-up.  Diagnoses and all orders for this visit:  Essential hypertension -     potassium chloride SA (K-DUR) 20 MEQ tablet; Take 2 tablets (40 mEq total) by mouth daily. -     amLODipine (NORVASC) 10 MG tablet; Take 1 tablet (10 mg total) by mouth daily. Continue all antihypertensives as prescribed.  Remember to bring in your blood pressure log with you for your follow up appointment.  DASH/Mediterranean Diets are healthier choices for HTN.    Diabetes mellitus type 2, uncontrolled, with complications (HCC) -     Glucose (CBG) Continue blood sugar control as discussed in office today, low carbohydrate diet, and regular physical exercise as tolerated, 150 minutes per week (30 min each day, 5 days per week, or 50 min 3 days per week). Keep blood sugar logs with fasting goal of 90-130 mg/dl, post prandial (after you eat) less than 180.  For Hypoglycemia: BS <60 and Hyperglycemia BS >400; contact the clinic ASAP. Annual eye exams and foot exams are recommended.   Rectal bleeding -     Ambulatory referral to Gastroenterology -     hydrocortisone (ANUSOL-HC) 25 MG suppository; Place 1 suppository (25 mg total) rectally 2 (two) times daily.  Chronic diastolic CHF (congestive heart failure) ([place]) Follow up with Cardiology as instructed.  OSA on CPAP Continue CPAP nightly. Follow up with pulmonology Dr Halford Chessman as scheduled  Dyspnea on exertion -     methocarbamol (ROBAXIN) 500 MG tablet; Take 1 tablet (500 mg total) by mouth 3 (three) times daily.  Hypokalemia -     potassium chloride SA (K-DUR) 20 MEQ tablet; Take 2 tablets (40 mEq total) by mouth daily.  Mixed hyperlipidemia -     atorvastatin (LIPITOR) 20 MG tablet; Take 1 tablet (20 mg total) by mouth at bedtime. INSTRUCTIONS: Work on a low fat, heart healthy diet and participate in regular aerobic exercise program by working out at least 150 minutes per week; 5 days a week-30  minutes per day. Avoid red meat, fried foods. junk foods, sodas, sugary drinks, unhealthy snacking, alcohol and smoking.  Drink at least 48oz of water per day and monitor your carbohydrate intake daily.        Patient has been counseled on age-appropriate routine health concerns for screening and prevention. These are reviewed and up-to-date. Referrals have been placed accordingly. Immunizations are up-to-date or declined.    Subjective:   Chief Complaint  Patient presents with  . Follow-up    Pt. is here for a follow up.    HPI OVA MEEGAN 53 y.o. male presents to office today for follow up.  has a past medical history of A-fib [facility]), Anemia (05/29/2019), CHF (congestive heart failure) ([place]), Diabetes mellitus without complication ([place]), Elevated troponin (02/06/2019), Hypertension, Obesity, Pulmonary embolism ([place]), Rectal bleeding (05/29/2019), and Sleep apnea.   CHF He is involved in the patient outreach program and also seeing Cardiology at this time for his chronic diastolic CHF. Recommendations were made to patient regarding low salt food choices and limit fluid intake to <2L per day. He takes Xarelto for PAF. He states BLE swelling has significantly improved with torsemide 71m BID. He is trying to limit his sodium and fluid intake. States hard considering his environment (Currently residing in a halfway house).  Rectal Bleeding He has an appt with [facility] tomorrow for follow up to rectal bleeding/internal hemorrhoids. States last episode of rectal bleeding was yesterday. Requesting  refill of anusol as he states this seems to help with his rectal irritation. Also taking stool softener however also taking iron which may be contributing to hard stools.    DM TYPE 2 Currently well controlled. He endorses medication compliance taking metformin 500 mg BID. Denies any hypoglycemic symptoms. Lab Results  Component Value Date   HGBA1C 6.1 (A) 04/30/2019   Essential Hypertension  Well controlled. He is not able to monitor his blood pressure at his place of residence. Taking all medications as prescribed which include: amlodipine 10 mg daily, labetalol 200 mg BID. Potassium is low today. I will temporarily increase his potassium to 45mq and have him follow up for repeat BMP in 2-3 weeks.  BP Readings from Last 3 Encounters:  06/04/19 130/76  06/04/19 132/75  06/03/19 (!) 152/92   Review of Systems  Constitutional: Negative for fever, malaise/fatigue and weight loss.  HENT: Negative.  Negative for nosebleeds.   Eyes: Negative.  Negative for blurred vision, double vision and photophobia.  Respiratory: Positive for shortness of breath (with exertion; using home O2). Negative for cough.   Cardiovascular: Negative.  Negative for chest pain, palpitations and leg swelling.  Gastrointestinal: Negative.  Negative for heartburn, nausea and vomiting.  Musculoskeletal: Negative.  Negative for myalgias.  Neurological: Negative.  Negative for dizziness, focal weakness, seizures and headaches.  Psychiatric/Behavioral: Negative.  Negative for suicidal ideas.    Past Medical History:  Diagnosis Date  . A-fib ([place]   . Anemia 05/29/2019  . CHF (congestive heart failure) ([place]   . Diabetes mellitus without complication ([place]   . Elevated troponin 02/06/2019  . Hypertension   . Obesity   . Pulmonary embolism ([place]   . Rectal bleeding 05/29/2019  . Sleep apnea     Past Surgical History:  Procedure Laterality Date  . COLONOSCOPY WITH PROPOFOL Left 02/25/2019   Procedure: COLONOSCOPY WITH PROPOFOL;  Surgeon: OArta Silence MD;  [place] [facility]  Service: Endoscopy;  Laterality: Left;  . ESOPHAGOGASTRODUODENOSCOPY (EGD) WITH PROPOFOL Left 02/25/2019   Procedure: ESOPHAGOGASTRODUODENOSCOPY (EGD) WITH PROPOFOL;  Surgeon: OArta Silence MD;  [facility];  Service: Endoscopy;  Laterality: Left;  . NO PAST SURGERIES    . RIGHT HEART CATH N/A 04/17/2019    Procedure: RIGHT HEART CATH;  Surgeon: MLarey Dresser MD;  [facility];  Service: Cardiovascular;  Laterality: N/A;    Family History  Problem Relation Age of Onset  . Hypertension Mother   . Diabetes Mother     Social History Reviewed with no changes to be made today.   Outpatient Medications Prior to Visit  Medication Sig Dispense Refill  . albuterol (VENTOLIN HFA) 108 (90 Base) MCG/ACT inhaler Inhale 2 puffs into the lungs every 6 (six) hours as needed for wheezing or shortness of breath.     . Blood Glucose Monitoring Suppl (TRUE METRIX METER) w/Device KIT Use as instructed. Check blood glucose level by fingerstick twice per day. 1 kit 0  . carboxymethylcellulose (REFRESH PLUS) 0.5 % SOLN Place 1 drop into both eyes daily.     .Marland Kitchendocusate sodium (COLACE) 100 MG capsule Take 1 capsule (100 mg total) by mouth 2 (two) times daily. 60 capsule 2  . ferrous sulfate 325 (65 FE) MG tablet Take 1 tablet (325 mg total) by mouth daily. Patient will pick up scripts today. 90 tablet 0  . glucose blood (TRUE METRIX BLOOD GLUCOSE TEST) test strip Use as instructed. Check blood glucose level by fingerstick twice  per day. 100 each 12  . labetalol (NORMODYNE) 200 MG tablet Take 1 tablet (200 mg total) by mouth 2 (two) times daily. 120 tablet 0  . levalbuterol (XOPENEX HFA) 45 MCG/ACT inhaler Inhale 2 puffs into the lungs every 4 (four) hours as needed for wheezing.     . metFORMIN (GLUCOPHAGE) 500 MG tablet Take 1 tablet (500 mg total) by mouth 2 (two) times a day. Patient will pick up scripts today. (Patient taking differently: Take 500 mg by mouth 2 (two) times daily with a meal. Patient will pick up scripts today.) 180 tablet 0  . omeprazole (PRILOSEC) 20 MG capsule Take 1 capsule (20 mg total) by mouth daily. 30 capsule 3  . polyethylene glycol (MIRALAX / GLYCOLAX) 17 g packet Take 17 g by mouth daily as needed for severe constipation. 14 each 0  . rivaroxaban (XARELTO) 20 MG TABS  tablet Take 1 tablet (20 mg total) by mouth daily with supper. 60 tablet 0  . senna-docusate (SENOKOT-S) 8.6-50 MG tablet Take 2 tablets by mouth 2 (two) times daily. 60 tablet 3  . torsemide (DEMADEX) 20 MG tablet Take 4 tablets (80 mg total) by mouth 2 (two) times daily. 240 tablet 6  . TRUEplus Lancets 28G MISC Use as instructed. Check blood glucose level by fingerstick twice per day. 200 each 3  . amLODipine (NORVASC) 10 MG tablet Take 1 tablet (10 mg total) by mouth daily. 60 tablet 0  . atorvastatin (LIPITOR) 20 MG tablet Take 1 tablet (20 mg total) by mouth at bedtime. 60 tablet 0  . hydrocortisone (ANUSOL-HC) 25 MG suppository Place 1 suppository (25 mg total) rectally 2 (two) times daily. 12 suppository 0  . potassium chloride (K-DUR) 10 MEQ tablet Take 2 tablets (20 mEq total) by mouth daily. 180 tablet 3   No facility-administered medications prior to visit.     No Known Allergies     Objective:    BP 130/76 (BP Location: Left Arm, Patient Position: Sitting, Cuff Size: Large)   Pulse 87   Temp 99 F (37.2 C) (Oral)   Ht 5' 11"  (1.803 m)   Wt (!) 401 lb (181.9 kg)   SpO2 99%   BMI 55.93 kg/m  Wt Readings from Last 3 Encounters:  06/04/19 (!) 401 lb (181.9 kg)  06/04/19 (!) 401 lb 3.2 oz (182 kg)  05/30/19 (!) 408 lb 1.1 oz (185.1 kg)    Physical Exam Vitals signs and nursing note reviewed.  Constitutional:      Appearance: He is well-developed.  HENT:     Head: Normocephalic and atraumatic.  Neck:     Musculoskeletal: Normal range of motion.  Cardiovascular:     Rate and Rhythm: Normal rate and regular rhythm.     Heart sounds: Normal heart sounds. No murmur. No friction rub. No gallop.   Pulmonary:     Effort: Pulmonary effort is normal. No tachypnea or respiratory distress.     Breath sounds: No decreased breath sounds, wheezing, rhonchi or rales.  Chest:     Chest wall: No tenderness.  Abdominal:     General: Abdomen is protuberant.  Musculoskeletal:         General: No tenderness.  Skin:    General: Skin is warm and dry.  Neurological:     Mental Status: He is alert and oriented to person, place, and time.     Coordination: Coordination normal.  Psychiatric:        Behavior: Behavior normal. Behavior is  cooperative.        Thought Content: Thought content normal.        Judgment: Judgment normal.        Patient has been counseled extensively about nutrition and exercise as well as the importance of adherence with medications and regular follow-up. The patient was given clear instructions to go to ER or return to medical center if symptoms don't improve, worsen or new problems develop. The patient verbalized understanding.   Follow-up: Return in about 3 weeks (around 06/25/2019) for Labs for BMP.   [name], FNP-BC [facility] [place], [place]   06/04/2019, 5:39 PM

## 2019-06-04 NOTE — Progress Notes (Signed)
Patient presents for vaccination against influenza per orders of [name]. Consent given. Counseling provided. No contraindications exists. Vaccine administered without incident.   

## 2019-06-04 NOTE — Patient Instructions (Signed)
Lab work done today. We will notify you of any abnormal lab work. No news is good news!  Please follow up with the [facility] in 2 months.  At the [facility], you and your health needs are our priority. As part of our continuing mission to provide you with exceptional heart care, we have created designated Provider Care Teams. These Care Teams include your primary Cardiologist (physician) and Advanced Practice Providers (APPs- Physician Assistants and Nurse Practitioners) who all work together to provide you with the care you need, when you need it.   You may see any of the following providers on your designated Care Team at your next follow up: . Dr Daniel Bensimhon . Dr Dalton McLean . Amy Clegg, NP   Please be sure to bring in all your medications bottles to every appointment.    

## 2019-06-04 NOTE — Progress Notes (Signed)
PCP: Juluis Mire NP Primary HF Cardiologist: Dr Aundra Dubin  GI: Dr Paulita Fujita   HPI: [name] is a 53 yo with hx of diastolic CHF, HTN, DM, Atrial fib, PE in April 2020 (on Xarelto), OSA, GERD,chronic elevation of troponin.    Admitted 01/21/2019 with increased dyspnea and had PE. He was placed on back on coumadin .  Patient re-admitted 5/20 for syncope/cough/dyspnea, COVID+, CT positive for bilateral GGOs, admitted to [facility] and discharged after 6 days, on O2. Rec'd Cards f/u.  Patient re-admitted 02/20/19 for bright red blood per rectum, eval'ed by GI, had EGD/colon, dx'd with hemorrhoidal bleeding, discharged after 9 days on O2. He was again Covid + .  Readmitted 03/11/19 with increased shortness of breath. He did not have home oxygen. He was discharged to home the next day.   He presented to [facility] ED 04/11/19 with lower extremity edema . COVID negative. Diuresed with lasix drip and transitioned to torsemide 40 mg twice daily. Had RHC as noted below with mild volume overload and preserved cardiac output. Discharge weight was 407.9 pounds.   He was readmitted with symptomatic anemia 9/8 with hgb 7.1. Received 1UPRBCs. GI consulted. He started on anusol and continued on stool softner.   Today he returns for HF follow up.Overall feeling better. Denies BRBPR. Remains SOB with exertion. Denies PND/Orthopnea. Using CPAP every night.  Appetite ok. No fever or chills. Weight at home 401-408 pounds. Taking all medications. Lives at halfway house. Followed by HF Paramedicine.   Echo 01/2019:EF 60-65%, grade I DD  RHC 04/17/2019  RA mean 12 RV 37/18 PA 40/25, mean 31 PCWP mean 23 Oxygen saturations: PA 64% AO 100% Cardiac Output (Fick) 7.07  Cardiac Index (Fick) 2.47 PVR 1.1 WU  ROS: All systems negative except as listed in HPI, PMH and Problem List.  SH:  Social History   Socioeconomic History  . Marital status: Married    Spouse name: Not on file  . Number of children: Not on file  .  Years of education: Not on file  . Highest education level: Not on file  Occupational History  . Not on file  Social Needs  . Financial resource strain: Not on file  . Food insecurity    Worry: Not on file    Inability: Not on file  . Transportation needs    Medical: Not on file    Non-medical: Not on file  Tobacco Use  . Smoking status: Former Research scientist (life sciences)  . Smokeless tobacco: Never Used  Substance and Sexual Activity  . Alcohol use: Not Currently  . Drug use: Not Currently  . Sexual activity: Not Currently  Lifestyle  . Physical activity    Days per week: Not on file    Minutes per session: Not on file  . Stress: Not on file  Relationships  . Social Herbalist on phone: Not on file    Gets together: Not on file    Attends religious service: Not on file    Active member of club or organization: Not on file    Attends meetings of clubs or organizations: Not on file    Relationship status: Not on file  . Intimate partner violence    Fear of current or ex partner: Not on file    Emotionally abused: Not on file    Physically abused: Not on file    Forced sexual activity: Not on file  Other Topics Concern  . Not on file  Social History Narrative  .  Not on file    FH:  Family History  Problem Relation Age of Onset  . Hypertension Mother   . Diabetes Mother     Past Medical History:  Diagnosis Date  . A-fib ([place])   . Anemia 05/29/2019  . CHF (congestive heart failure) ([place])   . Diabetes mellitus without complication ([place])   . Elevated troponin 02/06/2019  . Hypertension   . Obesity   . Pulmonary embolism ([place])   . Rectal bleeding 05/29/2019  . Sleep apnea     Current Outpatient Medications  Medication Sig Dispense Refill  . albuterol (VENTOLIN HFA) 108 (90 Base) MCG/ACT inhaler Inhale 2 puffs into the lungs every 6 (six) hours as needed for wheezing or shortness of breath.     Marland Kitchen amLODipine (NORVASC) 10 MG tablet Take 1 tablet (10 mg total) by mouth  daily. 60 tablet 0  . atorvastatin (LIPITOR) 20 MG tablet Take 1 tablet (20 mg total) by mouth at bedtime. 60 tablet 0  . Blood Glucose Monitoring Suppl (TRUE METRIX METER) w/Device KIT Use as instructed. Check blood glucose level by fingerstick twice per day. 1 kit 0  . carboxymethylcellulose (REFRESH PLUS) 0.5 % SOLN Place 1 drop into both eyes daily.     Marland Kitchen docusate sodium (COLACE) 100 MG capsule Take 1 capsule (100 mg total) by mouth 2 (two) times daily. 60 capsule 2  . ferrous sulfate 325 (65 FE) MG tablet Take 1 tablet (325 mg total) by mouth daily. Patient will pick up scripts today. 90 tablet 0  . glucose blood (TRUE METRIX BLOOD GLUCOSE TEST) test strip Use as instructed. Check blood glucose level by fingerstick twice per day. 100 each 12  . hydrocortisone (ANUSOL-HC) 25 MG suppository Place 1 suppository (25 mg total) rectally 2 (two) times daily. 12 suppository 0  . labetalol (NORMODYNE) 200 MG tablet Take 1 tablet (200 mg total) by mouth 2 (two) times daily. 120 tablet 0  . levalbuterol (XOPENEX HFA) 45 MCG/ACT inhaler Inhale 2 puffs into the lungs every 4 (four) hours as needed for wheezing.     . metFORMIN (GLUCOPHAGE) 500 MG tablet Take 1 tablet (500 mg total) by mouth 2 (two) times a day. Patient will pick up scripts today. (Patient taking differently: Take 500 mg by mouth 2 (two) times daily with a meal. Patient will pick up scripts today.) 180 tablet 0  . omeprazole (PRILOSEC) 20 MG capsule Take 1 capsule (20 mg total) by mouth daily. 30 capsule 3  . polyethylene glycol (MIRALAX / GLYCOLAX) 17 g packet Take 17 g by mouth daily as needed for severe constipation. 14 each 0  . potassium chloride (K-DUR) 10 MEQ tablet Take 2 tablets (20 mEq total) by mouth daily. 60 tablet 0  . rivaroxaban (XARELTO) 20 MG TABS tablet Take 1 tablet (20 mg total) by mouth daily with supper. 60 tablet 0  . senna-docusate (SENOKOT-S) 8.6-50 MG tablet Take 2 tablets by mouth 2 (two) times daily. 60 tablet 3   . torsemide (DEMADEX) 20 MG tablet Take 4 tablets (80 mg total) by mouth 2 (two) times daily. 240 tablet 6  . TRUEplus Lancets 28G MISC Use as instructed. Check blood glucose level by fingerstick twice per day. 200 each 3   No current facility-administered medications for this encounter.     Vitals:   06/04/19 1352  BP: 132/75  Pulse: 93  SpO2: 99%  Weight: (!) 182 kg (401 lb 3.2 oz)   Wt Readings from Last 3  Encounters:  06/04/19 (!) 182 kg (401 lb 3.2 oz)  05/30/19 (!) 185.1 kg (408 lb 1.1 oz)  05/28/19 (!) 185.2 kg (408 lb 3.2 oz)    PHYSICAL EXAM: General:  No resp difficulty. Walked slowly in the clinic.  HEENT: normal Neck: supple. JVP does not look elevated. Carotids 2+ bilat; no bruits. No lymphadenopathy or thryomegaly appreciated. Cor: PMI nondisplaced. Regular rate & rhythm. No rubs, gallops or murmurs. Lungs: clear Abdomen: obese, soft, nontender, nondistended. No hepatosplenomegaly. No bruits or masses. Good bowel sounds. Extremities: no cyanosis, clubbing, rash, R and LLE trace edema Neuro: alert & orientedx3, cranial nerves grossly intact. moves all 4 extremities w/o difficulty. Affect pleasant  .  ASSESSMENT & PLAN: 1. Chronic diastolic CHF: Echo in 5/78 showed EF 60-65% but RV was poorly visualized. He was initially diuresed with Lasix gtt 10 mg/hr and weight dropped 10 lbs. However, creatinine increased from 1.24 =>2.38.  I suspect significant RV dysfunction in setting of OSA, unfortunately unable to see the RV well on last echo. [place] on 7/29 showed elevated left and right heart filling pressures but not marked elevation.   NYHA IIIb. Volume status stable. Continue torsemide 80 mg twice a day.  - Check BMET today.   2. CKD Stage II.  Check BMET  3. Atrial fibrillation: Paroxysmal.   - Regular pulse.  - Continue Xarelto. Check CBC today. May need to switch to eliquis.  4. OSA: Continue CPAP nightly   5. Obesity: Body mass index is 55.96 kg/m.  Discussed  portion control.    6. H/o PE: 5/20.  Continue Xarelto.   7. HTN: Elevated  8. Hematochezia   Had scope in June with internal hemorrhoids. Continue stool softener. Continue anusol HC as needed per GI recommendations after scope. He has follow up with GI tomorrow  Seems to be doing better but at high risk for readmit. Continue HF Paramedicine. Follow up in 2-3 months.     Arlet Marter NP-C  2:20 PM

## 2019-06-04 NOTE — Progress Notes (Signed)
CSW called taxi for pt to go to [facility] appt following clinic appt.  Met with pt to check in- pt reports feeling well at this time.  CSW will continue to follow and assist as needed  [name], [facility] Desk#: [phone] Cell#: [phone]

## 2019-06-04 NOTE — Progress Notes (Signed)
Paramedicine Encounter   Patient ID: Benjamin [name] , male,   DOB: August 22, 1966,53 y.o.,  MRN: [id number]   Met patient in clinic today with provider.   B/p-132/75 Hr-93 sp02-99 Weight @ clinic-401  Pt reports he is doing well today, last night he had BM without any blood.  He is using the anusol twice daily. I told him to get his PCP today to add more refills on it as the hospitalists prescribed it for him and she will need to approve more refills.  He has account at [facility] for his meds now on the blue card.  He is still pending with his medicaid/disablilty.  No med changes today.  Labs done.  He is going to PCP after this visit.   [name], [place] 06/04/2019

## 2019-06-05 ENCOUNTER — Telehealth (HOSPITAL_COMMUNITY): Payer: Self-pay

## 2019-06-05 ENCOUNTER — Telehealth (HOSPITAL_COMMUNITY): Payer: Self-pay | Admitting: Licensed Clinical Social Worker

## 2019-06-05 DIAGNOSIS — I10 Essential (primary) hypertension: Secondary | ICD-10-CM

## 2019-06-05 DIAGNOSIS — Z8679 Personal history of other diseases of the circulatory system: Secondary | ICD-10-CM | POA: Diagnosis not present

## 2019-06-05 DIAGNOSIS — K625 Hemorrhage of anus and rectum: Secondary | ICD-10-CM | POA: Diagnosis not present

## 2019-06-05 DIAGNOSIS — E876 Hypokalemia: Secondary | ICD-10-CM

## 2019-06-05 MED ORDER — POTASSIUM CHLORIDE CRYS ER 20 MEQ PO TBCR: 40.0000 meq | EXTENDED_RELEASE_TABLET | Freq: Two times a day (BID) | ORAL | 3 refills | Status: DC

## 2019-06-05 MED FILL — ?AMLODIPINE BESYLATE 10 MG: 10 | 30 days supply | Qty: 30 | Fill #0

## 2019-06-05 MED FILL — ?ATORVASTATIN 20 MG TABLET: 20 | 30 days supply | Qty: 30 | Fill #0

## 2019-06-05 NOTE — Telephone Encounter (Signed)
-----   Message from Amy D Clegg, NP sent at 06/05/2019  1:18 PM EDT ----- K low. Increase potassium to 40 meq twice a day. Please call. 

## 2019-06-05 NOTE — Telephone Encounter (Signed)
Called pt to review lab work. Pt verbalized understanding. Pt requested that I let Katie with paramedicine know. Called katie, no answer.

## 2019-06-05 NOTE — Telephone Encounter (Signed)
CSW contacted patient Disability Case Worker to inquire if they needed notes sent from pt visit yesterday- confirmed they needed those notes for his case and faxed them requested clinic notes  CSW will continue to follow and assist as needed  [name], [facility] Desk#: [phone] Cell#: [phone]

## 2019-06-05 NOTE — Telephone Encounter (Signed)
-----   Message from Conrad Hillandale, NP sent at 06/05/2019  1:18 PM EDT ----- K low. Increase potassium to 40 meq twice a day. Please call.

## 2019-06-06 ENCOUNTER — Other Ambulatory Visit (HOSPITAL_COMMUNITY): Payer: Self-pay

## 2019-06-06 MED FILL — POTASSIUM CL ER 20 MEQ TAB: 20 | 30 days supply | Qty: 120 | Fill #0

## 2019-06-06 NOTE — Progress Notes (Signed)
Came out today for med rec. He states someone else called him regarding his blood and they wanted him to take an iron pill but he took an extra torsemide instead...... Obviously he got very confused as that was wrong.  He states he didn't get any sleep last night for the extra urination.  It was actually low potassium, his potassium dosing was fixed in pill box. I will be out of office on Monday on our usual day so I will see him on Tuesday. I filled pill box back up again so he wont miss any doses.  I also went by pharmacy to pick up meds for him.   [name], EMT-Paramedic  06/06/19

## 2019-06-07 ENCOUNTER — Telehealth (HOSPITAL_COMMUNITY): Payer: Self-pay | Admitting: *Deleted

## 2019-06-07 NOTE — Telephone Encounter (Signed)
Previous goals: LONG TERM:  - Weight loss (300 lbs)  - "Get my breath back"  - "Get my life back, get on my own again"  - "Chill out"   SHORT TERM:  Last week:  - Lose weight and improve breathing (330 lbs)  - He wants to walk around the block in 2 months (November). - Since hospitalized will back walking ramp for 3x- 2 days, and walk stairs for 3x- 2 days o WILL REST FOR 2 DAYS BEFORE RESUMIMG AND ONLY IF HE FEELS BETTER   2 weeks ago: :  - Lose weight and improve breathing (330 lbs)  - He wants to walk around the block in 2 months (November). - Continue walking ramp for 4x- 3 days  - Continue walking stairs for 5x- 2 days   - Journal/document daily  o Rate leg burning "crying out" 10/10 with stair climbing  o Feelings with diet and exercise regimen  o Achieved? (if no, and why not and how do you feel about not achieving?)   - Continue with diet improvement-receiving guidance elsewhere in this area - Weigh every day  - No sodas allowed in living facility  Progress notes:  Patient stated he did not do anything except sleep this past week. He had many doctor's appointments. He was not up for completing the action steps on the ramp and the stairs. He felt weak. He stated Benjamin [name], his paramedic has been following him as well. Patient feels he has fallen behind and says his legs are stiff again. Patient said he likes to get the action plan and walking done first thing in the morning or after lunch. He prefers the walk on the ramp in the morning and the stairs indoors in the afternoon. He is very thankful for his support in the facility. He talked about his mother again and how grateful he is for her. He feels motivated by his relationship with his mother.    Achievements:  - He is slowly continuing to lose weight and feels proud of this - He was proud of his exercise improvements  - He is proud to sustain compliance with his medications and doctor's appointments without  - He improved  his diet and did not have any relapses since last week   Challenges/Frustrations:  - Cravings for grapes and he does not have any access  - Bloody bowel movement, he wants this resolved   Goals:  - Lose weight and improve breathing (330 lbs)  - He wants to walk around the block in 2 months (November). - Every day walk ramp 1x daily.  - Document how many times walking up and down stairs.  - Continue with diet improvement-receiving guidance elsewhere in this area - Weigh every day  - No sodas allowed in living facility-do better than next week (no relapses)  Summary: Benjamin [name] was very honest and open to explain his last week. He wants to be able to report back soon about his exercise improvements. He says that talking about his life and his goals is motivating and helps him to stay on track and to "reset" for another week. He is looking forward to another week to make progress. CEP will follow next week with a telephone encounter. All agreeable with plan.

## 2019-06-11 ENCOUNTER — Inpatient Hospital Stay (HOSPITAL_COMMUNITY)
Admission: EM | Admit: 2019-06-11 | Discharge: 2019-06-13 | DRG: 394 | Disposition: A | Discharge status: Home / Self Care | Payer: Medicaid Other | Attending: Internal Medicine | Admitting: Internal Medicine

## 2019-06-11 ENCOUNTER — Other Ambulatory Visit (HOSPITAL_COMMUNITY): Payer: Self-pay

## 2019-06-11 ENCOUNTER — Encounter (HOSPITAL_COMMUNITY): Payer: Self-pay | Admitting: Emergency Medicine

## 2019-06-11 ENCOUNTER — Emergency Department (HOSPITAL_COMMUNITY): Payer: Medicaid Other

## 2019-06-11 DIAGNOSIS — K625 Hemorrhage of anus and rectum: Secondary | ICD-10-CM | POA: Diagnosis not present

## 2019-06-11 DIAGNOSIS — D62 Acute posthemorrhagic anemia: Secondary | ICD-10-CM | POA: Diagnosis present

## 2019-06-11 DIAGNOSIS — E785 Hyperlipidemia, unspecified: Secondary | ICD-10-CM | POA: Diagnosis present

## 2019-06-11 DIAGNOSIS — Z79899 Other long term (current) drug therapy: Secondary | ICD-10-CM

## 2019-06-11 DIAGNOSIS — Z8619 Personal history of other infectious and parasitic diseases: Secondary | ICD-10-CM

## 2019-06-11 DIAGNOSIS — Z8249 Family history of ischemic heart disease and other diseases of the circulatory system: Secondary | ICD-10-CM

## 2019-06-11 DIAGNOSIS — I10 Essential (primary) hypertension: Secondary | ICD-10-CM | POA: Diagnosis not present

## 2019-06-11 DIAGNOSIS — Z7901 Long term (current) use of anticoagulants: Secondary | ICD-10-CM | POA: Diagnosis not present

## 2019-06-11 DIAGNOSIS — K635 Polyp of colon: Secondary | ICD-10-CM | POA: Diagnosis present

## 2019-06-11 DIAGNOSIS — K922 Gastrointestinal hemorrhage, unspecified: Secondary | ICD-10-CM | POA: Diagnosis present

## 2019-06-11 DIAGNOSIS — D649 Anemia, unspecified: Secondary | ICD-10-CM

## 2019-06-11 DIAGNOSIS — Z86711 Personal history of pulmonary embolism: Secondary | ICD-10-CM

## 2019-06-11 DIAGNOSIS — G4733 Obstructive sleep apnea (adult) (pediatric): Secondary | ICD-10-CM

## 2019-06-11 DIAGNOSIS — Z9119 Patient's noncompliance with other medical treatment and regimen: Secondary | ICD-10-CM

## 2019-06-11 DIAGNOSIS — E119 Type 2 diabetes mellitus without complications: Secondary | ICD-10-CM | POA: Diagnosis present

## 2019-06-11 DIAGNOSIS — K644 Residual hemorrhoidal skin tags: Secondary | ICD-10-CM | POA: Diagnosis present

## 2019-06-11 DIAGNOSIS — I2699 Other pulmonary embolism without acute cor pulmonale: Secondary | ICD-10-CM | POA: Diagnosis present

## 2019-06-11 DIAGNOSIS — E1169 Type 2 diabetes mellitus with other specified complication: Secondary | ICD-10-CM | POA: Diagnosis present

## 2019-06-11 DIAGNOSIS — I5032 Chronic diastolic (congestive) heart failure: Secondary | ICD-10-CM | POA: Diagnosis present

## 2019-06-11 DIAGNOSIS — K5900 Constipation, unspecified: Secondary | ICD-10-CM | POA: Diagnosis present

## 2019-06-11 DIAGNOSIS — J9610 Chronic respiratory failure, unspecified whether with hypoxia or hypercapnia: Secondary | ICD-10-CM | POA: Diagnosis present

## 2019-06-11 DIAGNOSIS — I110 Hypertensive heart disease with heart failure: Secondary | ICD-10-CM | POA: Diagnosis present

## 2019-06-11 DIAGNOSIS — J9611 Chronic respiratory failure with hypoxia: Secondary | ICD-10-CM | POA: Diagnosis present

## 2019-06-11 DIAGNOSIS — K648 Other hemorrhoids: Principal | ICD-10-CM | POA: Diagnosis present

## 2019-06-11 DIAGNOSIS — Z20828 Contact with and (suspected) exposure to other viral communicable diseases: Secondary | ICD-10-CM | POA: Diagnosis present

## 2019-06-11 DIAGNOSIS — Z833 Family history of diabetes mellitus: Secondary | ICD-10-CM

## 2019-06-11 DIAGNOSIS — K649 Unspecified hemorrhoids: Secondary | ICD-10-CM | POA: Diagnosis not present

## 2019-06-11 DIAGNOSIS — R52 Pain, unspecified: Secondary | ICD-10-CM | POA: Diagnosis not present

## 2019-06-11 DIAGNOSIS — Z87891 Personal history of nicotine dependence: Secondary | ICD-10-CM

## 2019-06-11 DIAGNOSIS — E662 Morbid (severe) obesity with alveolar hypoventilation: Secondary | ICD-10-CM | POA: Diagnosis present

## 2019-06-11 DIAGNOSIS — R531 Weakness: Secondary | ICD-10-CM | POA: Diagnosis not present

## 2019-06-11 DIAGNOSIS — Z7984 Long term (current) use of oral hypoglycemic drugs: Secondary | ICD-10-CM

## 2019-06-11 DIAGNOSIS — I509 Heart failure, unspecified: Secondary | ICD-10-CM

## 2019-06-11 DIAGNOSIS — I480 Paroxysmal atrial fibrillation: Secondary | ICD-10-CM | POA: Diagnosis present

## 2019-06-11 DIAGNOSIS — Z6841 Body Mass Index (BMI) 40.0 and over, adult: Secondary | ICD-10-CM

## 2019-06-11 DIAGNOSIS — G47419 Narcolepsy without cataplexy: Secondary | ICD-10-CM | POA: Diagnosis present

## 2019-06-11 HISTORY — DX: Chronic respiratory failure, unspecified whether with hypoxia or hypercapnia: J96.10

## 2019-06-11 LAB — CBC WITH DIFFERENTIAL/PLATELET
Abs Immature Granulocytes: 0.04 10*3/uL (ref 0.00–0.07)
Basophils Absolute: 0 10*3/uL (ref 0.0–0.1)
Basophils Relative: 0 %
Eosinophils Absolute: 0.6 10*3/uL — ABNORMAL HIGH (ref 0.0–0.5)
Eosinophils Relative: 7 %
HCT: 24.5 % — ABNORMAL LOW (ref 39.0–52.0)
Hemoglobin: 7 g/dL — ABNORMAL LOW (ref 13.0–17.0)
Immature Granulocytes: 1 %
Lymphocytes Relative: 26 %
Lymphs Abs: 2 10*3/uL (ref 0.7–4.0)
MCH: 22.8 pg — ABNORMAL LOW (ref 26.0–34.0)
MCHC: 28.6 g/dL — ABNORMAL LOW (ref 30.0–36.0)
MCV: 79.8 fL — ABNORMAL LOW (ref 80.0–100.0)
Monocytes Absolute: 0.6 10*3/uL (ref 0.1–1.0)
Monocytes Relative: 8 %
Neutro Abs: 4.5 10*3/uL (ref 1.7–7.7)
Neutrophils Relative %: 58 %
Platelets: 351 10*3/uL (ref 150–400)
RBC: 3.07 MIL/uL — ABNORMAL LOW (ref 4.22–5.81)
RDW: 18.5 % — ABNORMAL HIGH (ref 11.5–15.5)
WBC: 7.8 10*3/uL (ref 4.0–10.5)
nRBC: 0.3 % — ABNORMAL HIGH (ref 0.0–0.2)

## 2019-06-11 LAB — CBG MONITORING, ED: Glucose-Capillary: 123 mg/dL — ABNORMAL HIGH (ref 70–99)

## 2019-06-11 LAB — COMPREHENSIVE METABOLIC PANEL
ALT: 14 U/L (ref 0–44)
AST: 17 U/L (ref 15–41)
Albumin: 3.2 g/dL — ABNORMAL LOW (ref 3.5–5.0)
Alkaline Phosphatase: 46 U/L (ref 38–126)
Anion gap: 7 (ref 5–15)
BUN: 10 mg/dL (ref 6–20)
CO2: 28 mmol/L (ref 22–32)
Calcium: 8.5 mg/dL — ABNORMAL LOW (ref 8.9–10.3)
Chloride: 103 mmol/L (ref 98–111)
Creatinine, Ser: 1.22 mg/dL (ref 0.61–1.24)
GFR calc Af Amer: >60 mL/min (ref >60)
GFR calc non Af Amer: >60 mL/min (ref >60)
Glucose, Bld: 119 mg/dL — ABNORMAL HIGH (ref 70–99)
Potassium: 4 mmol/L (ref 3.5–5.1)
Sodium: 138 mmol/L (ref 135–145)
Total Bilirubin: 0.3 mg/dL (ref 0.3–1.2)
Total Protein: 6.5 g/dL (ref 6.5–8.1)

## 2019-06-11 LAB — PREPARE RBC (CROSSMATCH)

## 2019-06-11 LAB — PROTIME-INR
INR: 1.3 — ABNORMAL HIGH (ref 0.8–1.2)
Prothrombin Time: 16 seconds — ABNORMAL HIGH (ref 11.4–15.2)

## 2019-06-11 LAB — POC OCCULT BLOOD, ED: Fecal Occult Bld: POSITIVE — AB

## 2019-06-11 LAB — SARS CORONAVIRUS 2 (TAT 6-24 HRS): SARS Coronavirus 2: NEGATIVE

## 2019-06-11 LAB — GLUCOSE, CAPILLARY: Glucose-Capillary: 125 mg/dL — ABNORMAL HIGH (ref 70–99)

## 2019-06-11 MED ORDER — AMLODIPINE BESYLATE 10 MG PO TABS
10.0000 mg | ORAL_TABLET | Freq: Every day | ORAL | Status: DC
  Administered 2019-06-12 – 2019-06-13 (×2): 10 mg via ORAL
  Filled 2019-06-11 (×2): qty 1

## 2019-06-11 MED ORDER — FLEET ENEMA 7-19 GM/118ML RE ENEM
1.0000 | ENEMA | Freq: Once | RECTAL | Status: AC
  Administered 2019-06-12: 1 via RECTAL
  Filled 2019-06-11: qty 1

## 2019-06-11 MED ORDER — METHOCARBAMOL 500 MG PO TABS
500.0000 mg | ORAL_TABLET | Freq: Three times a day (TID) | ORAL | Status: DC
  Administered 2019-06-11 – 2019-06-13 (×6): 500 mg via ORAL
  Filled 2019-06-11 (×6): qty 1

## 2019-06-11 MED ORDER — INSULIN ASPART 100 UNIT/ML ~~LOC~~ SOLN: 0.0000 [IU] | Freq: Every day | SUBCUTANEOUS | Status: DC

## 2019-06-11 MED ORDER — HYPROMELLOSE (GONIOSCOPIC) 2.5 % OP SOLN
1.0000 [drp] | Freq: Every day | OPHTHALMIC | Status: DC
  Administered 2019-06-13: 1 [drp] via OPHTHALMIC
  Filled 2019-06-11: qty 15

## 2019-06-11 MED ORDER — SODIUM CHLORIDE 0.9 % IV SOLN: 10.0000 mL/h | Freq: Once | INTRAVENOUS | Status: DC

## 2019-06-11 MED ORDER — CARBOXYMETHYLCELLULOSE SODIUM 0.5 % OP SOLN: 1.0000 [drp] | Freq: Every day | OPHTHALMIC | Status: DC

## 2019-06-11 MED ORDER — PANTOPRAZOLE SODIUM 40 MG PO TBEC
40.0000 mg | DELAYED_RELEASE_TABLET | Freq: Every day | ORAL | Status: DC
  Administered 2019-06-12 – 2019-06-13 (×2): 40 mg via ORAL
  Filled 2019-06-11 (×2): qty 1

## 2019-06-11 MED ORDER — LABETALOL HCL 200 MG PO TABS
200.0000 mg | ORAL_TABLET | Freq: Two times a day (BID) | ORAL | Status: DC
  Administered 2019-06-11 – 2019-06-13 (×4): 200 mg via ORAL
  Filled 2019-06-11 (×4): qty 1

## 2019-06-11 MED ORDER — INSULIN ASPART 100 UNIT/ML ~~LOC~~ SOLN
0.0000 [IU] | Freq: Three times a day (TID) | SUBCUTANEOUS | Status: DC
  Administered 2019-06-11: 3 [IU] via SUBCUTANEOUS
  Administered 2019-06-13: 1 [IU] via SUBCUTANEOUS

## 2019-06-11 MED ORDER — FLEET ENEMA 7-19 GM/118ML RE ENEM: 1.0000 | ENEMA | Freq: Once | RECTAL | Status: DC

## 2019-06-11 MED ORDER — HYDROCORTISONE ACETATE 25 MG RE SUPP
25.0000 mg | Freq: Two times a day (BID) | RECTAL | Status: DC
  Administered 2019-06-11 – 2019-06-13 (×3): 25 mg via RECTAL
  Filled 2019-06-11 (×4): qty 1

## 2019-06-11 MED ORDER — ALBUTEROL SULFATE HFA 108 (90 BASE) MCG/ACT IN AERS: 2.0000 | INHALATION_SPRAY | Freq: Four times a day (QID) | RESPIRATORY_TRACT | Status: DC | PRN

## 2019-06-11 MED ORDER — ACETAMINOPHEN 325 MG PO TABS: 650.0000 mg | ORAL_TABLET | Freq: Four times a day (QID) | ORAL | Status: DC | PRN

## 2019-06-11 MED ORDER — ACETAMINOPHEN 650 MG RE SUPP: 650.0000 mg | Freq: Four times a day (QID) | RECTAL | Status: DC | PRN

## 2019-06-11 MED ORDER — ATORVASTATIN CALCIUM 10 MG PO TABS
20.0000 mg | ORAL_TABLET | Freq: Every day | ORAL | Status: DC
  Administered 2019-06-11 – 2019-06-12 (×2): 20 mg via ORAL
  Filled 2019-06-11 (×2): qty 2

## 2019-06-11 MED ORDER — ONDANSETRON HCL 4 MG/2ML IJ SOLN: 4.0000 mg | Freq: Four times a day (QID) | INTRAMUSCULAR | Status: DC | PRN

## 2019-06-11 MED ORDER — ONDANSETRON HCL 4 MG PO TABS: 4.0000 mg | ORAL_TABLET | Freq: Four times a day (QID) | ORAL | Status: DC | PRN

## 2019-06-11 MED ORDER — ALBUTEROL SULFATE (2.5 MG/3ML) 0.083% IN NEBU: 2.5000 mg | INHALATION_SOLUTION | Freq: Four times a day (QID) | RESPIRATORY_TRACT | Status: DC | PRN

## 2019-06-11 NOTE — ED Triage Notes (Addendum)
Pt arrives via ems from a group home for a recurrent issue of rectal bleeding x1 month. Pt states this has been ongoing he has been admitted to the hospital received blood transfusion and colonoscopy. Pt states he is weak and feels tired. Pt has narcolepsy and keeps going to sleep during triage and has to be woken up which startles him.

## 2019-06-11 NOTE — ED Notes (Signed)
Orthostatics were completed with lying and sitting measures but excluded standing measures. Pt has narcolepsy and frequently has attacks, so standing measure was not conducted.

## 2019-06-11 NOTE — ED Notes (Signed)
NSR w/ST depression on monitor 

## 2019-06-11 NOTE — Progress Notes (Addendum)
Paramedicine Encounter    Patient ID: Benjamin [name], male    DOB: 11/24/1965, 53 y.o.   MRN: [id number]   Patient Care Team: [name], NP as PCP - General (Nurse Practitioner) [name], MD as PCP - Cardiology (Cardiology) [name], LCSW as Social Worker (Licensed Clinical Social Worker)  Patient Active Problem List   Diagnosis Date Noted  . Hypokalemia 05/29/2019  . GI bleed 05/29/2019  . Anemia 05/28/2019  . Acute on chronic diastolic CHF (congestive heart failure) ([place]) 04/11/2019  . Iron deficiency anemia 03/11/2019  . OSA on CPAP 03/11/2019  . HLD (hyperlipidemia) 03/11/2019  . Elevated troponin 03/11/2019  . GERD (gastroesophageal reflux disease) 03/11/2019  . Rectal bleeding 02/20/2019  . Dyspnea 02/06/2019  . COVID-19 virus infection 02/06/2019  . Bilateral lower extremity edema   . Obesity, Class III, BMI 40-49.9 (morbid obesity) ([place])   . Chronic diastolic CHF (congestive heart failure) ([place])   . PAF (paroxysmal atrial fibrillation) ([place])   . PE (pulmonary thromboembolism) ([place]) 01/21/2019  . Hypertensive urgency 01/21/2019  . Diabetes mellitus type 2 in obese ([place]) 01/21/2019    Current Outpatient Medications:  .  albuterol (VENTOLIN HFA) 108 (90 Base) MCG/ACT inhaler, Inhale 2 puffs into the lungs every 6 (six) hours as needed for wheezing or shortness of breath. , Disp: , Rfl:  .  amLODipine (NORVASC) 10 MG tablet, Take 1 tablet (10 mg total) by mouth daily., Disp: 90 tablet, Rfl: 1 .  atorvastatin (LIPITOR) 20 MG tablet, Take 1 tablet (20 mg total) by mouth at bedtime., Disp: 90 tablet, Rfl: 2 .  Blood Glucose Monitoring Suppl (TRUE METRIX METER) w/Device KIT, Use as instructed. Check blood glucose level by fingerstick twice per day., Disp: 1 kit, Rfl: 0 .  carboxymethylcellulose (REFRESH PLUS) 0.5 % SOLN, Place 1 drop into both eyes daily. , Disp: , Rfl:  .  docusate sodium (COLACE) 100 MG capsule, Take 1 capsule (100 mg total) by mouth 2  (two) times daily., Disp: 60 capsule, Rfl: 2 .  ferrous sulfate 325 (65 FE) MG tablet, Take 1 tablet (325 mg total) by mouth daily. Patient will pick up scripts today., Disp: 90 tablet, Rfl: 0 .  glucose blood (TRUE METRIX BLOOD GLUCOSE TEST) test strip, Use as instructed. Check blood glucose level by fingerstick twice per day., Disp: 100 each, Rfl: 12 .  hydrocortisone (ANUSOL-HC) 25 MG suppository, Place 1 suppository (25 mg total) rectally 2 (two) times daily., Disp: 36 suppository, Rfl: 1 .  labetalol (NORMODYNE) 200 MG tablet, Take 1 tablet (200 mg total) by mouth 2 (two) times daily., Disp: 120 tablet, Rfl: 0 .  levalbuterol (XOPENEX HFA) 45 MCG/ACT inhaler, Inhale 2 puffs into the lungs every 4 (four) hours as needed for wheezing. , Disp: , Rfl:  .  metFORMIN (GLUCOPHAGE) 500 MG tablet, Take 1 tablet (500 mg total) by mouth 2 (two) times a day. Patient will pick up scripts today. (Patient taking differently: Take 500 mg by mouth 2 (two) times daily with a meal. Patient will pick up scripts today.), Disp: 180 tablet, Rfl: 0 .  methocarbamol (ROBAXIN) 500 MG tablet, Take 1 tablet (500 mg total) by mouth 3 (three) times daily., Disp: 60 tablet, Rfl: 1 .  omeprazole (PRILOSEC) 20 MG capsule, Take 1 capsule (20 mg total) by mouth daily., Disp: 30 capsule, Rfl: 3 .  polyethylene glycol (MIRALAX / GLYCOLAX) 17 g packet, Take 17 g by mouth daily as needed for severe constipation.,  Disp: 14 each, Rfl: 0 .  potassium chloride SA (K-DUR) 20 MEQ tablet, Take 2 tablets (40 mEq total) by mouth 2 (two) times daily., Disp: 360 tablet, Rfl: 3 .  rivaroxaban (XARELTO) 20 MG TABS tablet, Take 1 tablet (20 mg total) by mouth daily with supper., Disp: 60 tablet, Rfl: 0 .  senna-docusate (SENOKOT-S) 8.6-50 MG tablet, Take 2 tablets by mouth 2 (two) times daily., Disp: 60 tablet, Rfl: 3 .  torsemide (DEMADEX) 20 MG tablet, Take 4 tablets (80 mg total) by mouth 2 (two) times daily., Disp: 240 tablet, Rfl: 6 .  TRUEplus  Lancets 28G MISC, Use as instructed. Check blood glucose level by fingerstick twice per day., Disp: 200 each, Rfl: 3 No Known Allergies    Social History   Socioeconomic History  . Marital status: Married    Spouse name: Not on file  . Number of children: Not on file  . Years of education: Not on file  . Highest education level: Not on file  Occupational History  . Not on file  Social Needs  . Financial resource strain: Not on file  . Food insecurity    Worry: Not on file    Inability: Not on file  . Transportation needs    Medical: Not on file    Non-medical: Not on file  Tobacco Use  . Smoking status: Former Research scientist (life sciences)  . Smokeless tobacco: Never Used  Substance and Sexual Activity  . Alcohol use: Not Currently  . Drug use: Not Currently  . Sexual activity: Not Currently  Lifestyle  . Physical activity    Days per week: Not on file    Minutes per session: Not on file  . Stress: Not on file  Relationships  . Social Herbalist on phone: Not on file    Gets together: Not on file    Attends religious service: Not on file    Active member of club or organization: Not on file    Attends meetings of clubs or organizations: Not on file    Relationship status: Not on file  . Intimate partner violence    Fear of current or ex partner: Not on file    Emotionally abused: Not on file    Physically abused: Not on file    Forced sexual activity: Not on file  Other Topics Concern  . Not on file  Social History Narrative  . Not on file    Physical Exam      Future Appointments  Date Time Provider [place]  06/28/2019  1:30 PM [name], NP [facility] None  07/04/2019 11:45 AM [name], MD [facility] None  08/05/2019 11:30 AM [facility] PA/NP [facility] None    BP (!) 184/0   Pulse 88   Temp (!) 97.5 F (36.4 C)   Resp 20   SpO2 99%  B/p- 154/110 [facility] EMS-154  Spoke to pt on phone this morning regarding time of visit today, he states he  does not feel good. He feels "weak as a dog"  He has had continued rectal bleeding. Feels very tired and weak. Been going on for several days.  He has still used the  Suppositories and it worked for a several days but now back to not helping. He is suppose to be called by his GI about scheduling visit for surgery for this.  He states he has been cramping all over and abd pains.  I called for a unit to come get  him to take to ER for further evaluation.  His b/p is elevated, he is not sure if he took his meds this morning. He just looks like he doesn't feel good. He normally is in good mood, joking and cutting up so this is definitely not normal for him.  I will f/u with him with his d/c plan and go back out to see him once he is out.    [name], [facility] Paramedic  06/11/19

## 2019-06-11 NOTE — ED Notes (Signed)
Pt keeps taking bp and Sa02 off.

## 2019-06-11 NOTE — ED Provider Notes (Addendum)
[facility] EMERGENCY DEPARTMENT Provider Note   CSN: [id number] Arrival date & time: 06/11/19  0932     History   Chief Complaint Chief Complaint  Patient presents with  . Rectal Bleeding    HPI BILLAL ROLLO is a 53 y.o. male.     HPI Patient presents to the ED for evaluation of persistent rectal bleeding.  Patient has been having issues of anemia and rectal bleeding for over the past month.  Patient also has several other medical problems including obstructive sleep apnea, obesity hypoventilation syndrome and CHF.  Patient was last admitted to the hospital on September 8.  Patient states he continues to have rectal bleeding.  It never has really stopped.  Patient states the blood is dark.  He has not noticed any clots of blood.  He has not had any trouble with vomiting or hematemesis.  He is on Xarelto and has been continued on that medication.  He did feel better after his blood transfusion when he was last admitted to the hospital.  Over the last week or so he has become increasingly weak.  That is what brought him back to the ED. Past Medical History:  Diagnosis Date  . A-fib ([place])   . Anemia 05/29/2019  . CHF (congestive heart failure) ([place])   . Diabetes mellitus without complication ([place])   . Elevated troponin 02/06/2019  . Hypertension   . Obesity   . Pulmonary embolism ([place])   . Rectal bleeding 05/29/2019  . Sleep apnea     Patient Active Problem List   Diagnosis Date Noted  . Hypokalemia 05/29/2019  . GI bleed 05/29/2019  . Anemia 05/28/2019  . Acute on chronic diastolic CHF (congestive heart failure) ([place]) 04/11/2019  . Iron deficiency anemia 03/11/2019  . OSA on CPAP 03/11/2019  . HLD (hyperlipidemia) 03/11/2019  . Elevated troponin 03/11/2019  . GERD (gastroesophageal reflux disease) 03/11/2019  . Rectal bleeding 02/20/2019  . Dyspnea 02/06/2019  . COVID-19 virus infection 02/06/2019  . Bilateral lower extremity edema   . Obesity,  Class III, BMI 40-49.9 (morbid obesity) ([place])   . Chronic diastolic CHF (congestive heart failure) ([place])   . PAF (paroxysmal atrial fibrillation) ([place])   . PE (pulmonary thromboembolism) ([place]) 01/21/2019  . Hypertensive urgency 01/21/2019  . Diabetes mellitus type 2 in obese ([place]) 01/21/2019    Past Surgical History:  Procedure Laterality Date  . COLONOSCOPY WITH PROPOFOL Left 02/25/2019   Procedure: COLONOSCOPY WITH PROPOFOL;  Surgeon: Arta Silence, MD;  Location: [facility];  Service: Endoscopy;  Laterality: Left;  . ESOPHAGOGASTRODUODENOSCOPY (EGD) WITH PROPOFOL Left 02/25/2019   Procedure: ESOPHAGOGASTRODUODENOSCOPY (EGD) WITH PROPOFOL;  Surgeon: Arta Silence, MD;  Location: [facility] ENDOSCOPY;  Service: Endoscopy;  Laterality: Left;  . NO PAST SURGERIES    . RIGHT HEART CATH N/A 04/17/2019   Procedure: RIGHT HEART CATH;  Surgeon: Larey Dresser, MD;  Location: [facility] CV LAB;  Service: Cardiovascular;  Laterality: N/A;        Home Medications    Prior to Admission medications   Medication Sig Start Date End Date Taking? Authorizing Provider  albuterol (VENTOLIN HFA) 108 (90 Base) MCG/ACT inhaler Inhale 2 puffs into the lungs every 6 (six) hours as needed for wheezing or shortness of breath.     [provider]  amLODipine (NORVASC) 10 MG tablet Take 1 tablet (10 mg total) by mouth daily. 06/04/19 09/02/19  [name], NP  atorvastatin (LIPITOR) 20 MG tablet Take 1  tablet (20 mg total) by mouth at bedtime. 06/04/19 09/02/19  [name], NP  Blood Glucose Monitoring Suppl (TRUE METRIX METER) w/Device KIT Use as instructed. Check blood glucose level by fingerstick twice per day. 03/18/19   [name], NP  carboxymethylcellulose (REFRESH PLUS) 0.5 % SOLN Place 1 drop into both eyes daily.     [provider]  docusate sodium (COLACE) 100 MG capsule Take 1 capsule (100 mg total) by mouth 2 (two) times daily. 03/01/19   Monica Becton, MD  ferrous  sulfate 325 (65 FE) MG tablet Take 1 tablet (325 mg total) by mouth daily. Patient will pick up scripts today. 03/18/19 06/16/19  [name], NP  glucose blood (TRUE METRIX BLOOD GLUCOSE TEST) test strip Use as instructed. Check blood glucose level by fingerstick twice per day. 03/18/19   [name], NP  hydrocortisone (ANUSOL-HC) 25 MG suppository Place 1 suppository (25 mg total) rectally 2 (two) times daily. 06/04/19   [name], NP  labetalol (NORMODYNE) 200 MG tablet Take 1 tablet (200 mg total) by mouth 2 (two) times daily. 04/19/19   Kayleen Memos, DO  levalbuterol [facility] HFA) 45 MCG/ACT inhaler Inhale 2 puffs into the lungs every 4 (four) hours as needed for wheezing.     [provider]  metFORMIN (GLUCOPHAGE) 500 MG tablet Take 1 tablet (500 mg total) by mouth 2 (two) times a day. Patient will pick up scripts today. Patient taking differently: Take 500 mg by mouth 2 (two) times daily with a meal. Patient will pick up scripts today. 03/18/19 06/16/19  [name], NP  methocarbamol (ROBAXIN) 500 MG tablet Take 1 tablet (500 mg total) by mouth 3 (three) times daily. 06/04/19 07/04/19  [name], NP  omeprazole (PRILOSEC) 20 MG capsule Take 1 capsule (20 mg total) by mouth daily. 05/19/19   [name], NP  polyethylene glycol (MIRALAX / GLYCOLAX) 17 g packet Take 17 g by mouth daily as needed for severe constipation. 03/01/19   Monica Becton, MD  potassium chloride SA (K-DUR) 20 MEQ tablet Take 2 tablets (40 mEq total) by mouth 2 (two) times daily. 06/05/19 07/05/19  Clegg, Amy D, NP  rivaroxaban (XARELTO) 20 MG TABS tablet Take 1 tablet (20 mg total) by mouth daily with supper. 04/19/19   Kayleen Memos, DO  senna-docusate (SENOKOT-S) 8.6-50 MG tablet Take 2 tablets by mouth 2 (two) times daily. 05/30/19 06/29/19  Elgergawy, Silver Huguenin, MD  torsemide (DEMADEX) 20 MG tablet Take 4 tablets (80 mg total) by mouth 2 (two) times daily. 05/28/19   Clegg, Amy D, NP   TRUEplus Lancets 28G MISC Use as instructed. Check blood glucose level by fingerstick twice per day. 03/18/19   [name], NP    Family History Family History  Problem Relation Age of Onset  . Hypertension Mother   . Diabetes Mother     Social History Social History   Tobacco Use  . Smoking status: Former Research scientist (life sciences)  . Smokeless tobacco: Never Used  Substance Use Topics  . Alcohol use: Not Currently  . Drug use: Not Currently     Allergies   Patient has no known allergies.   Review of Systems Review of Systems   Physical Exam Updated Vital Signs BP (!) 159/65   Pulse 77   Temp 98.1 F (36.7 C) (Oral)   Resp 17   SpO2 98%   Physical Exam Vitals signs and nursing note reviewed.  Constitutional:      Appearance: He is well-developed. He is not toxic-appearing or diaphoretic.     Comments: Morbidly obese  HENT:     Head: Normocephalic and atraumatic.     Right Ear: External ear normal.     Left Ear: External ear normal.  Eyes:     General: No scleral icterus.       Right eye: No discharge.        Left eye: No discharge.     Conjunctiva/sclera: Conjunctivae normal.  Neck:     Musculoskeletal: Neck supple.     Trachea: No tracheal deviation.  Cardiovascular:     Rate and Rhythm: Normal rate and regular rhythm.  Pulmonary:     Effort: Pulmonary effort is normal. No respiratory distress.     Breath sounds: Normal breath sounds. No stridor. No wheezing or rales.  Abdominal:     General: Bowel sounds are normal. There is no distension.     Palpations: Abdomen is soft.     Tenderness: There is no abdominal tenderness. There is no guarding or rebound.  Genitourinary:    Comments: No external hemorrhoid, no mass on rectal exam, streaks of blood noted on glove Musculoskeletal:        General: No tenderness.     Right lower leg: Edema present.  Skin:    General: Skin is warm and dry.     Findings: No rash.  Neurological:     Mental Status: He is alert.      Cranial Nerves: No cranial nerve deficit (no facial droop, extraocular movements intact, no slurred speech).     Sensory: No sensory deficit.     Motor: No abnormal muscle tone or seizure activity.     Coordination: Coordination normal.      ED Treatments / Results  Labs (all labs ordered are listed, but only abnormal results are displayed) Labs Reviewed  COMPREHENSIVE METABOLIC PANEL - Abnormal; Notable for the following components:      Result Value   Glucose, Bld 119 (*)    Calcium 8.5 (*)    Albumin 3.2 (*)    All other components within normal limits  CBC WITH DIFFERENTIAL/PLATELET - Abnormal; Notable for the following components:   RBC 3.07 (*)    Hemoglobin 7.0 (*)    HCT 24.5 (*)    MCV 79.8 (*)    MCH 22.8 (*)    MCHC 28.6 (*)    RDW 18.5 (*)    nRBC 0.3 (*)    Eosinophils Absolute 0.6 (*)    All other components within normal limits  PROTIME-INR - Abnormal; Notable for the following components:   Prothrombin Time 16.0 (*)    INR 1.3 (*)    All other components within normal limits  POC OCCULT BLOOD, ED - Abnormal; Notable for the following components:   Fecal Occult Bld POSITIVE (*)    All other components within normal limits  TYPE AND SCREEN  PREPARE RBC (CROSSMATCH)    EKG None  Radiology Dg Chest Portable 1 View  Result Date: 06/11/2019 CLINICAL DATA:  GI bleed. EXAM: PORTABLE CHEST 1 VIEW COMPARISON:  05/12/2019 FINDINGS: The heart size and mediastinal contours are within normal limits. Both lungs are clear. The visualized skeletal structures are unremarkable. IMPRESSION: No active disease. Electronically Signed   By: Lorriane Shire M.D.   On: 06/11/2019 10:22    Procedures .Critical Care Performed by: Dorie Rank, MD Authorized by: Dorie Rank, MD   Critical  care provider statement:    Critical care time (minutes):  45   Critical care was time spent personally by me on the following activities:  Discussions with consultants, evaluation of  patient's response to treatment, examination of patient, ordering and performing treatments and interventions, ordering and review of laboratory studies, ordering and review of radiographic studies, pulse oximetry, re-evaluation of patient's condition, obtaining history from patient or surrogate and review of old charts   (including critical care time)  Medications Ordered in ED Medications  0.9 %  sodium chloride infusion (has no administration in time range)     Initial Impression / Assessment and Plan / ED Course  I have reviewed the triage vital signs and the nursing notes.  Pertinent labs & imaging results that were available during my care of the patient were reviewed by me and considered in my medical decision making (see chart for details).  Clinical Course as of Jun 11 822  Tue Jun 11, 2019  1040 GI consult from earlier this month reviewed.  Etiology of the patient's rectal bleeding was felt to be related to hemorrhoids complicated with his Xarelto use.  Patient had colonoscopy/endoscopy in June of this year.  Findings at that time were notable for internal and external hemorrhoids without other abnormal findings   [JK]  1128 Patient's labs do show worsening anemia.  Hemoglobin is down to 7.   [BW]  4665 This has been slowly trending downward over the last couple of weeks.  Patient's hemoglobin was 11.1 back on July 31.   [LD]  3570 Discussed with Dr. Lorin Mercy for admission.  Case discussed with Dr. Alessandra Bevels who will consult on pt   [JK]    Clinical Course User Index [JK] Dorie Rank, MD     Patient presents to the ED with complaints of recurrent weakness and persistent rectal bleeding, felt to be related to hemorrhoidal bleeding.  Patient has remained hemodynamically stable but he does have worsening anemia.  Hemoglobin is now down to 7.  Pt would benefit from blood transfusion.  Will consult with medical service for admission.  Final Clinical Impressions(s) / ED Diagnoses    Final diagnoses:  Lower GI bleed  Anemia, unspecified type      Dorie Rank, MD 06/11/19 1137    Dorie Rank, MD 06/12/19 [phone]

## 2019-06-11 NOTE — Consult Note (Signed)
Referring Provider:  EDP Primary Care Physician:  [name], NP Primary Gastroenterologist: Dr. Alessandra Bevels  Reason for Consultation: Anemia/GI bleed  HPI: Benjamin [name] is a 53 y.o. male with past medical history of paroxysmal atrial fibrillation on Xarelto, history of PE in May 2020, COVID -19 positive with subsequent negative cover tests presented to the hospital with recurrent GI bleed.  He was admitted earlier in the month and was discharged home with hydrocortisone suppositories for possible hemorrhoidal bleeding.  He continues to have intermittent bright blood on the tissue paper as well as in the commode.  He is now also complaining of black tarry stools.  Last bowel movement this morning.  Complaining of left lower quadrant abdominal discomfort.  Denies any nausea and vomiting.  He presented to the hospital with hematochezia in June 2020.  Underwent EGD and colonoscopy on February 25, 2019 which showed no evidence of active bleeding and hemorrhoids.  Past Medical History:  Diagnosis Date  . A-fib ([place])   . Anemia 05/29/2019  . CHF (congestive heart failure) ([place])   . Diabetes mellitus without complication ([place])   . Elevated troponin 02/06/2019  . Hypertension   . Obesity   . Pulmonary embolism ([place])   . Rectal bleeding 05/29/2019  . Sleep apnea     Past Surgical History:  Procedure Laterality Date  . COLONOSCOPY WITH PROPOFOL Left 02/25/2019   Procedure: COLONOSCOPY WITH PROPOFOL;  Surgeon: Arta Silence, MD;  Location: [facility];  Service: Endoscopy;  Laterality: Left;  . ESOPHAGOGASTRODUODENOSCOPY (EGD) WITH PROPOFOL Left 02/25/2019   Procedure: ESOPHAGOGASTRODUODENOSCOPY (EGD) WITH PROPOFOL;  Surgeon: Arta Silence, MD;  Location: [facility] ENDOSCOPY;  Service: Endoscopy;  Laterality: Left;  . NO PAST SURGERIES    . RIGHT HEART CATH N/A 04/17/2019   Procedure: RIGHT HEART CATH;  Surgeon: Larey Dresser, MD;  Location: [facility] CV LAB;  Service: Cardiovascular;   Laterality: N/A;    Prior to Admission medications   Medication Sig Start Date End Date Taking? Authorizing Provider  albuterol (VENTOLIN HFA) 108 (90 Base) MCG/ACT inhaler Inhale 2 puffs into the lungs every 6 (six) hours as needed for wheezing or shortness of breath.    Yes [provider]  amLODipine (NORVASC) 10 MG tablet Take 1 tablet (10 mg total) by mouth daily. 04/20/19  Yes Hall, Carole N, DO  atorvastatin (LIPITOR) 20 MG tablet Take 1 tablet (20 mg total) by mouth at bedtime. 04/19/19  Yes Hall, Archie Patten N, DO  Blood Glucose Monitoring Suppl (TRUE METRIX METER) w/Device KIT Use as instructed. Check blood glucose level by fingerstick twice per day. 03/18/19  Yes [name], NP  carboxymethylcellulose (REFRESH PLUS) 0.5 % SOLN Place 1 drop into both eyes daily.    Yes [provider]  docusate sodium (COLACE) 100 MG capsule Take 1 capsule (100 mg total) by mouth 2 (two) times daily. 03/01/19  Yes Vasireddy, Grier Mitts, MD  ferrous sulfate 325 (65 FE) MG tablet Take 1 tablet (325 mg total) by mouth daily. Patient will pick up scripts today. 03/18/19 06/16/19 Yes [name], NP  glucose blood (TRUE METRIX BLOOD GLUCOSE TEST) test strip Use as instructed. Check blood glucose level by fingerstick twice per day. 03/18/19  Yes [name], NP  hydrocortisone (ANUSOL-HC) 25 MG suppository Place 1 suppository (25 mg total) rectally 2 (two) times daily as needed for hemorrhoids or anal itching. 05/28/19  Yes Clegg, Amy D, NP  labetalol (NORMODYNE) 200 MG tablet Take 1 tablet (200  mg total) by mouth 2 (two) times daily. 04/19/19  Yes Hall, Carole N, DO  levalbuterol [facility] HFA) 45 MCG/ACT inhaler Inhale 2 puffs into the lungs every 4 (four) hours as needed for wheezing.    Yes [provider]  metFORMIN (GLUCOPHAGE) 500 MG tablet Take 1 tablet (500 mg total) by mouth 2 (two) times a day. Patient will pick up scripts today. Patient taking differently: Take 500 mg by mouth  2 (two) times daily with a meal. Patient will pick up scripts today. 03/18/19 06/16/19 Yes [name], NP  omeprazole (PRILOSEC) 20 MG capsule Take 1 capsule (20 mg total) by mouth daily. 05/19/19  Yes [name], NP  polyethylene glycol (MIRALAX / GLYCOLAX) 17 g packet Take 17 g by mouth daily as needed for severe constipation. 03/01/19  Yes Vasireddy, Grier Mitts, MD  potassium chloride (K-DUR) 10 MEQ tablet Take 2 tablets (20 mEq total) by mouth daily. 04/19/19  Yes Kayleen Memos, DO  rivaroxaban (XARELTO) 20 MG TABS tablet Take 1 tablet (20 mg total) by mouth daily with supper. 04/19/19  Yes Hall, Carole N, DO  senna-docusate (SENOKOT-S) 8.6-50 MG tablet Take 2 tablets by mouth daily. 05/19/19 06/18/19 Yes [name], NP  torsemide (DEMADEX) 20 MG tablet Take 4 tablets (80 mg total) by mouth 2 (two) times daily. 05/28/19  Yes Clegg, Amy D, NP  TRUEplus Lancets 28G MISC Use as instructed. Check blood glucose level by fingerstick twice per day. 03/18/19  Yes [name], NP  pantoprazole (PROTONIX) 40 MG tablet Take 1 tablet (40 mg total) by mouth daily. Patient not taking: Reported on 05/13/2019 04/20/19   Kayleen Memos, DO    Scheduled Meds: . Derrill Memo ON 06/12/2019] amLODipine  10 mg Oral Daily  . atorvastatin  20 mg Oral QHS  . carboxymethylcellulose  1 drop Both Eyes Daily  . hydrocortisone  25 mg Rectal BID  . insulin aspart  0-20 Units Subcutaneous TID WC  . insulin aspart  0-5 Units Subcutaneous QHS  . labetalol  200 mg Oral BID  . methocarbamol  500 mg Oral TID  . [START ON 06/12/2019] pantoprazole  40 mg Oral Daily   Continuous Infusions:  PRN Meds:.acetaminophen **OR** acetaminophen, albuterol, ondansetron **OR** ondansetron (ZOFRAN) IV  Allergies as of 06/11/2019  . (No Known Allergies)    Family History  Problem Relation Age of Onset  . Hypertension Mother   . Diabetes Mother     Social History   Socioeconomic History  . Marital status: Married    Spouse  name: Not on file  . Number of children: Not on file  . Years of education: Not on file  . Highest education level: Not on file  Occupational History  . Not on file  Social Needs  . Financial resource strain: Not on file  . Food insecurity    Worry: Not on file    Inability: Not on file  . Transportation needs    Medical: Not on file    Non-medical: Not on file  Tobacco Use  . Smoking status: Former Research scientist (life sciences)  . Smokeless tobacco: Never Used  Substance and Sexual Activity  . Alcohol use: Not Currently  . Drug use: Not Currently  . Sexual activity: Not Currently  Lifestyle  . Physical activity    Days per week: Not on file    Minutes per session: Not on file  . Stress: Not on file  Relationships  . Social connections  Talks on phone: Not on file    Gets together: Not on file    Attends religious service: Not on file    Active member of club or organization: Not on file    Attends meetings of clubs or organizations: Not on file    Relationship status: Not on file  . Intimate partner violence    Fear of current or ex partner: Not on file    Emotionally abused: Not on file    Physically abused: Not on file    Forced sexual activity: Not on file  Other Topics Concern  . Not on file  Social History Narrative  . Not on file    Review of Systems:Review of Systems  Constitutional: Negative for chills and fever.  HENT: Negative for hearing loss and tinnitus.   Eyes: Negative for blurred vision and double vision.  Respiratory: Negative for cough and hemoptysis.   Cardiovascular: Negative for chest pain and palpitations.  Gastrointestinal: Positive for abdominal pain, blood in stool, constipation and melena. Negative for diarrhea, heartburn, nausea and vomiting.  Genitourinary: Negative for dysuria and urgency.  Musculoskeletal: Negative for myalgias and neck pain.  Skin: Negative for itching and rash.  Neurological: Negative for seizures and loss of consciousness.   Endo/Heme/Allergies: Bruises/bleeds easily.  Psychiatric/Behavioral: Negative for substance abuse and suicidal ideas.    Physical Exam: Vital signs: Vitals:   06/11/19 1335 06/11/19 1350  BP: (!) 158/92 (!) 142/95  Pulse: 91 86  Resp: (!) 21 19  Temp: 98 F (36.7 C) 97.9 F (36.6 C)  SpO2: 100%      Physical Exam  Constitutional: He is oriented to person, place, and time. He appears well-developed and well-nourished. No distress.  HENT:  Head: Normocephalic and atraumatic.  Eyes: EOM are normal. No scleral icterus.  Neck: Normal range of motion. Neck supple.  Cardiovascular: Normal rate, regular rhythm and normal heart sounds.  Pulmonary/Chest: Effort normal and breath sounds normal. No respiratory distress.  Abdominal: Soft. Bowel sounds are normal. He exhibits no distension. There is no abdominal tenderness. There is no rebound and no guarding.  Obese abdomen with difficult exam  Musculoskeletal: Normal range of motion.        General: Edema present.  Neurological: He is alert and oriented to person, place, and time.  Skin: Skin is dry. No erythema.  Psychiatric: He has a normal mood and affect. Judgment and thought content normal.  Vitals reviewed.   GI:  Lab Results: Recent Labs    06/11/19 1033  WBC 7.8  HGB 7.0*  HCT 24.5*  PLT 351   BMET Recent Labs    06/11/19 1033  NA 138  K 4.0  CL 103  CO2 28  GLUCOSE 119*  BUN 10  CREATININE 1.22  CALCIUM 8.5*   LFT Recent Labs    06/11/19 1033  PROT 6.5  ALBUMIN 3.2*  AST 17  ALT 14  ALKPHOS 46  BILITOT 0.3   PT/INR Recent Labs    06/11/19 1033  LABPROT 16.0*  INR 1.3*     Studies/Results: Dg Chest Portable 1 View  Result Date: 06/11/2019 CLINICAL DATA:  GI bleed. EXAM: PORTABLE CHEST 1 VIEW COMPARISON:  05/12/2019 FINDINGS: The heart size and mediastinal contours are within normal limits. Both lungs are clear. The visualized skeletal structures are unremarkable. IMPRESSION: No active  disease. Electronically Signed   By: Lorriane Shire M.D.   On: 06/11/2019 10:22    Impression/Plan: -Recurrent GI bleed.   Initially presented  with bright blood per rectum but now also complaining of melena.  EGD and colonoscopy in June 2020 as negative  for active bleeding and only showed hemorrhoids. -Chronic blood loss anemia -History of atrial fibrillation.  On Xarelto, last dose yesterday -Constipation -Morbid obesity  Recommendations ------------------------- -Given recurrent GI bleed and anemia, we will schedule him for repeat EGD and flexible sigmoidoscopy tomorrow.  If negative, he may benefit from inpatient capsule study.  -Transfuse as needed to keep hemoglobin around 8. -N.p.o. past midnight.  Continue PPI. -GI will follow   LOS: 0 days   Otis Brace  MD, FACP 06/11/2019, 2:05 PM  Contact #  [phone]

## 2019-06-11 NOTE — ED Notes (Signed)
X-ray at bedside

## 2019-06-11 NOTE — Progress Notes (Signed)
RT asked if pt wears CPAP/BiPAP at home and pt immediately state "Oh no I don't need that tonight. I'm fine." RT then informed pt if he changes his mind during the night to call for RT.

## 2019-06-11 NOTE — Progress Notes (Addendum)
CSW following patient through outpatient [facility].  CSW met with pt at bedside to check in.  Pt reports he is doing better since this morning but is very hopeful they will be able to admit him and fix the problem with his rectal bleeding prior to returning home.  Pt in good spirits at this time.  CSW left message for Randall Hiss with [employer] who follows patient for CPAP at home to inform of pt admission as he follows his progress with the CPAP outpatient.  CSW will continue to follow outpatient and assist with follow up as needed  Benjamin [name], [facility] Desk#: [phone] Cell#: [phone]

## 2019-06-11 NOTE — H&P (Signed)
History and Physical    Benjamin [name] TGP:[id number] DOB: Jun 22, 1966 DOA: 06/11/2019  PCP: [name], NP Consultants:  Aundra Dubin - cardiology; [facility] - pulmonology; [facility] - GI Patient coming from:  Home - lives alone; NOK: mother, Benjamin [name], [phone]  Chief Complaint: Rectal bleeding  HPI: Benjamin [name] is a 53 y.o. male with medical history significant of OSA on CPAP; PE (01/21/19); COVID-19 (02/06/19); super morbid obesity (BMI 56); rectal bleeding 05/29/19; HTN; DM; CHF; OHS; narcolepsy and afib on Xarelto presenting with recurrent rectal bleeding.  Symptoms started about 4 months ago.  He has daily rectal bleeding, "bright red, dark, muddy, chocolate, all different kinds."  He feels continuously light-headed, dizzy.  He had  His last scopes in June.  "I'm hungry, I need something to eat."  He was admitted from 9/8-10 with symptomatic anemia from periodic rectal bleeding.  He was transfused 1 unit PRBCs.  ED Course:  Seen by GI in June with EGD/colo - hemorrhoids.  Hospitalized earlier this month, seen by GI and thought related to hemorrhoids.  Increasing weakness, ongoing bleeding.  Hgb 7 again, 7.8 on 9/15; 10-11 a few months ago.  Heme positive.  Awaiting page from GI.  Review of Systems: As per HPI; otherwise review of systems reviewed and negative.   Ambulatory Status:  Ambulates without assistance  Past Medical History:  Diagnosis Date  . A-fib ([place])   . Anemia 05/29/2019  . CHF (congestive heart failure) ([place])   . Diabetes mellitus without complication ([place])   . Elevated troponin 02/06/2019  . Hypertension   . Obesity   . Pulmonary embolism ([place])   . Rectal bleeding 05/29/2019  . Sleep apnea     Past Surgical History:  Procedure Laterality Date  . COLONOSCOPY WITH PROPOFOL Left 02/25/2019   Procedure: COLONOSCOPY WITH PROPOFOL;  Surgeon: Arta Silence, MD;  Location: [facility];  Service: Endoscopy;  Laterality: Left;  . ESOPHAGOGASTRODUODENOSCOPY (EGD) WITH  PROPOFOL Left 02/25/2019   Procedure: ESOPHAGOGASTRODUODENOSCOPY (EGD) WITH PROPOFOL;  Surgeon: Arta Silence, MD;  Location: [facility] ENDOSCOPY;  Service: Endoscopy;  Laterality: Left;  . NO PAST SURGERIES    . RIGHT HEART CATH N/A 04/17/2019   Procedure: RIGHT HEART CATH;  Surgeon: Larey Dresser, MD;  Location: [facility] CV LAB;  Service: Cardiovascular;  Laterality: N/A;    Social History   Socioeconomic History  . Marital status: Married    Spouse name: Not on file  . Number of children: Not on file  . Years of education: Not on file  . Highest education level: Not on file  Occupational History  . Not on file  Social Needs  . Financial resource strain: Not on file  . Food insecurity    Worry: Not on file    Inability: Not on file  . Transportation needs    Medical: Not on file    Non-medical: Not on file  Tobacco Use  . Smoking status: Former Research scientist (life sciences)  . Smokeless tobacco: Never Used  Substance and Sexual Activity  . Alcohol use: Not Currently  . Drug use: Not Currently  . Sexual activity: Not Currently  Lifestyle  . Physical activity    Days per week: Not on file    Minutes per session: Not on file  . Stress: Not on file  Relationships  . Social Herbalist on phone: Not on file    Gets together: Not on file    Attends religious service: Not on file  Active member of club or organization: Not on file    Attends meetings of clubs or organizations: Not on file    Relationship status: Not on file  . Intimate partner violence    Fear of current or ex partner: Not on file    Emotionally abused: Not on file    Physically abused: Not on file    Forced sexual activity: Not on file  Other Topics Concern  . Not on file  Social History Narrative  . Not on file    No Known Allergies  Family History  Problem Relation Age of Onset  . Hypertension Mother   . Diabetes Mother     Prior to Admission medications   Medication Sig Start Date End Date Taking?  Authorizing Provider  albuterol (VENTOLIN HFA) 108 (90 Base) MCG/ACT inhaler Inhale 2 puffs into the lungs every 6 (six) hours as needed for wheezing or shortness of breath.     [provider]  amLODipine (NORVASC) 10 MG tablet Take 1 tablet (10 mg total) by mouth daily. 06/04/19 09/02/19  [name], NP  atorvastatin (LIPITOR) 20 MG tablet Take 1 tablet (20 mg total) by mouth at bedtime. 06/04/19 09/02/19  [name], NP  Blood Glucose Monitoring Suppl (TRUE METRIX METER) w/Device KIT Use as instructed. Check blood glucose level by fingerstick twice per day. 03/18/19   [name], NP  carboxymethylcellulose (REFRESH PLUS) 0.5 % SOLN Place 1 drop into both eyes daily.     [provider]  docusate sodium (COLACE) 100 MG capsule Take 1 capsule (100 mg total) by mouth 2 (two) times daily. 03/01/19   Monica Becton, MD  ferrous sulfate 325 (65 FE) MG tablet Take 1 tablet (325 mg total) by mouth daily. Patient will pick up scripts today. 03/18/19 06/16/19  [name], NP  glucose blood (TRUE METRIX BLOOD GLUCOSE TEST) test strip Use as instructed. Check blood glucose level by fingerstick twice per day. 03/18/19   [name], NP  hydrocortisone (ANUSOL-HC) 25 MG suppository Place 1 suppository (25 mg total) rectally 2 (two) times daily. 06/04/19   [name], NP  labetalol (NORMODYNE) 200 MG tablet Take 1 tablet (200 mg total) by mouth 2 (two) times daily. 04/19/19   Kayleen Memos, DO  levalbuterol [facility] HFA) 45 MCG/ACT inhaler Inhale 2 puffs into the lungs every 4 (four) hours as needed for wheezing.     [provider]  metFORMIN (GLUCOPHAGE) 500 MG tablet Take 1 tablet (500 mg total) by mouth 2 (two) times a day. Patient will pick up scripts today. Patient taking differently: Take 500 mg by mouth 2 (two) times daily with a meal. Patient will pick up scripts today. 03/18/19 06/16/19  [name], NP  methocarbamol (ROBAXIN) 500 MG tablet  Take 1 tablet (500 mg total) by mouth 3 (three) times daily. 06/04/19 07/04/19  [name], NP  omeprazole (PRILOSEC) 20 MG capsule Take 1 capsule (20 mg total) by mouth daily. 05/19/19   [name], NP  polyethylene glycol (MIRALAX / GLYCOLAX) 17 g packet Take 17 g by mouth daily as needed for severe constipation. 03/01/19   Monica Becton, MD  potassium chloride SA (K-DUR) 20 MEQ tablet Take 2 tablets (40 mEq total) by mouth 2 (two) times daily. 06/05/19 07/05/19  Clegg, Amy D, NP  rivaroxaban (XARELTO) 20 MG TABS tablet Take 1 tablet (20 mg total) by mouth daily with supper. 04/19/19   Irene Pap  N, DO  senna-docusate (SENOKOT-S) 8.6-50 MG tablet Take 2 tablets by mouth 2 (two) times daily. 05/30/19 06/29/19  Elgergawy, Silver Huguenin, MD  torsemide (DEMADEX) 20 MG tablet Take 4 tablets (80 mg total) by mouth 2 (two) times daily. 05/28/19   Clegg, Amy D, NP  TRUEplus Lancets 28G MISC Use as instructed. Check blood glucose level by fingerstick twice per day. 03/18/19   [name], NP    Physical Exam: Vitals:   06/11/19 0938 06/11/19 0945 06/11/19 1000 06/11/19 1045  BP: (!) 152/80 (!) 145/71 132/87 (!) 159/65  Pulse: 82 79 77   Resp: 16 17 17 17   Temp: 98.1 F (36.7 C)     TempSrc: Oral     SpO2: 99% 100% 98%      . General:  Appears calm and comfortable and is NAD, demanding to eat, morbidly obese . Eyes:  EOMI, normal lids, iris . ENT:  grossly normal hearing, lips & tongue, mmm; appropriate dentition . Neck:  no LAD, masses or thyromegaly . Cardiovascular:  RRR, no m/r/g. No LE edema.  Marland Kitchen Respiratory:   CTA bilaterally with no wheezes/rales/rhonchi.  Normal respiratory effort. . Abdomen:  soft, mild TTP in LLQ, ND, NABS . Skin:  no rash or induration seen on limited exam . Musculoskeletal:  grossly normal tone BUE/BLE, good ROM, no bony abnormality . Psychiatric:  cantankerous mood and affect, speech fluent and appropriate, AOx3 . Neurologic:  CN 2-12 grossly intact,  moves all extremities in coordinated fashion, sensation intact    Radiological Exams on Admission: Dg Chest Portable 1 View  Result Date: 06/11/2019 CLINICAL DATA:  GI bleed. EXAM: PORTABLE CHEST 1 VIEW COMPARISON:  05/12/2019 FINDINGS: The heart size and mediastinal contours are within normal limits. Both lungs are clear. The visualized skeletal structures are unremarkable. IMPRESSION: No active disease. Electronically Signed   By: Lorriane Shire M.D.   On: 06/11/2019 10:22    EKG: not done   Labs on Admission: I have personally reviewed the available labs and imaging studies at the time of the admission.  Pertinent labs:   Glucose 119 Albumin 3.2 WBC 7.8 Hgb 7.0; 7.1-7.8 earlier this month, 9.8-11.1 7/30-8/23 Heme positive COVID pending; negative on 9/8, 8/25, 7/24, 7/23, 6/22 (ALSO WITH POSITIVE COVID TEST ON 6/22); 6/3; POSITIVE on 5/20; and negative on 5/4   Assessment/Plan Principal Problem:   Lower GI bleeding Active Problems:   PE (pulmonary thromboembolism) ([place])   Diabetes mellitus type 2 in obese (HCC)   Morbid obesity with BMI of 50.0-59.9, adult (HCC)   Chronic diastolic CHF (congestive heart failure) (HCC)   PAF (paroxysmal atrial fibrillation) (HCC)   OSA on CPAP   HLD (hyperlipidemia)   Anemia   Lower GI bleeding with anemia -Patient with recurrent prolonged h/o lower GI bleeding -C-scope in June with internal and external hemorrhoids -His current bleeding is likely related to the same -GI to see; I have discussed the patient with Dr. Alessandra Bevels and he is planning to repeat a flex sig tomorrow -Will place in Observation status for now -Transfusing 1 unit PRBCs for now with plan to repeat CBC tomorrow AM -Continue to monitor for recurrent bleeding  -Soft diet for now, NPO overnight -If hemorrhoidal bleeding is the source, he may benefit from surgery consultation -Will continue Aunsol HC BID for now  H/o PE in 5/20  -Hold Xarelto for now -PE may have  been in the context of COVID-19 infection -He is uncertain whether he had prior DVT/PE  in the past -He also has afib and so would also qualify for [facility] for that reason -At some point, if unable to stop rectal bleeding, he may need a trial off AC  Afib -Rate controlled with Labetaolol -Hold Xarelto for now, as above  Chronic CHF -01/22/19 echo with preserved EF and impaired diastolic relaxation NOS -Appears to be compensated at this time -Given GI bleeding, will hold Lasix for now  HTN -Continue Norvasc,   Super morbid obesity -BMI 56 -Weight loss should be encouraged -Outpatient PCP/bariatric medicine/bariatric surgery f/u encouraged  DM -A1c was 6.1 on 8/11 -hold Glucophage -Cover with resistant-scale SSI  OSA/OHS/Narcolepsy -Continue qhs Bipap  -Continue prn albuterol  HLD -Continue Lipitor   Note: This patient has been tested and is pending for the novel coronavirus COVID-19.   DVT prophylaxis:  SCDs Code Status:  Full - confirmed with patient Family Communication: None present  Disposition Plan:  Home once clinically improved Consults called: GI  Admission status: It is my clinical opinion that referral for OBSERVATION is reasonable and necessary in this patient based on the above information provided. The aforementioned taken together are felt to place the patient at high risk for further clinical deterioration. However it is anticipated that the patient may be medically stable for discharge from the hospital within 24 to 48 hours.     Karmen Bongo MD [facility]   How to contact the [facility] Attending or Consulting provider [place] or covering provider during after hours [place], for this patient?  1. Check the care team in [facility] and look for a) attending/consulting TR[facility] provider listed and b) the [facility] team listed 2. Log into [email or web address] and use [place]'s universal password to access. If you do not have the password, please contact the hospital operator.  3. Locate the [facility] provider you are looking for under [facility] and page to a number that you can be directly reached. 4. If you still have difficulty reaching the provider, please page the [facility] (Director on Call) for the Hospitalists listed on amion for assistance.   06/11/2019, 12:37 PM

## 2019-06-11 NOTE — ED Notes (Signed)
Pt given turkey sandwich

## 2019-06-12 ENCOUNTER — Encounter (HOSPITAL_COMMUNITY): Payer: Self-pay

## 2019-06-12 ENCOUNTER — Observation Stay (HOSPITAL_COMMUNITY): Payer: Medicaid Other | Admitting: Critical Care Medicine

## 2019-06-12 ENCOUNTER — Encounter (HOSPITAL_COMMUNITY)
Admission: EM | Disposition: A | Discharge status: Home / Self Care | Payer: Self-pay | Source: Home / Self Care | Attending: Internal Medicine

## 2019-06-12 ENCOUNTER — Other Ambulatory Visit: Payer: Self-pay

## 2019-06-12 DIAGNOSIS — I110 Hypertensive heart disease with heart failure: Secondary | ICD-10-CM | POA: Diagnosis not present

## 2019-06-12 DIAGNOSIS — I509 Heart failure, unspecified: Secondary | ICD-10-CM

## 2019-06-12 DIAGNOSIS — K648 Other hemorrhoids: Secondary | ICD-10-CM | POA: Diagnosis not present

## 2019-06-12 DIAGNOSIS — I480 Paroxysmal atrial fibrillation: Secondary | ICD-10-CM | POA: Diagnosis not present

## 2019-06-12 DIAGNOSIS — K635 Polyp of colon: Secondary | ICD-10-CM | POA: Diagnosis not present

## 2019-06-12 DIAGNOSIS — G4730 Sleep apnea, unspecified: Secondary | ICD-10-CM | POA: Insufficient documentation

## 2019-06-12 DIAGNOSIS — E785 Hyperlipidemia, unspecified: Secondary | ICD-10-CM | POA: Diagnosis not present

## 2019-06-12 DIAGNOSIS — K644 Residual hemorrhoidal skin tags: Secondary | ICD-10-CM | POA: Diagnosis not present

## 2019-06-12 DIAGNOSIS — Z79899 Other long term (current) drug therapy: Secondary | ICD-10-CM | POA: Diagnosis not present

## 2019-06-12 DIAGNOSIS — K625 Hemorrhage of anus and rectum: Secondary | ICD-10-CM | POA: Diagnosis present

## 2019-06-12 DIAGNOSIS — Z20828 Contact with and (suspected) exposure to other viral communicable diseases: Secondary | ICD-10-CM | POA: Diagnosis not present

## 2019-06-12 DIAGNOSIS — Z86711 Personal history of pulmonary embolism: Secondary | ICD-10-CM | POA: Diagnosis not present

## 2019-06-12 DIAGNOSIS — Z833 Family history of diabetes mellitus: Secondary | ICD-10-CM | POA: Diagnosis not present

## 2019-06-12 DIAGNOSIS — K922 Gastrointestinal hemorrhage, unspecified: Secondary | ICD-10-CM | POA: Diagnosis not present

## 2019-06-12 DIAGNOSIS — D649 Anemia, unspecified: Secondary | ICD-10-CM | POA: Diagnosis not present

## 2019-06-12 DIAGNOSIS — I5032 Chronic diastolic (congestive) heart failure: Secondary | ICD-10-CM | POA: Diagnosis not present

## 2019-06-12 DIAGNOSIS — D62 Acute posthemorrhagic anemia: Secondary | ICD-10-CM | POA: Diagnosis not present

## 2019-06-12 DIAGNOSIS — G47419 Narcolepsy without cataplexy: Secondary | ICD-10-CM | POA: Diagnosis present

## 2019-06-12 DIAGNOSIS — E662 Morbid (severe) obesity with alveolar hypoventilation: Secondary | ICD-10-CM | POA: Diagnosis not present

## 2019-06-12 DIAGNOSIS — Z7901 Long term (current) use of anticoagulants: Secondary | ICD-10-CM | POA: Diagnosis not present

## 2019-06-12 DIAGNOSIS — U071 COVID-19: Secondary | ICD-10-CM | POA: Diagnosis not present

## 2019-06-12 DIAGNOSIS — E119 Type 2 diabetes mellitus without complications: Secondary | ICD-10-CM | POA: Diagnosis present

## 2019-06-12 DIAGNOSIS — J9610 Chronic respiratory failure, unspecified whether with hypoxia or hypercapnia: Secondary | ICD-10-CM | POA: Diagnosis present

## 2019-06-12 DIAGNOSIS — I10 Essential (primary) hypertension: Secondary | ICD-10-CM | POA: Diagnosis present

## 2019-06-12 DIAGNOSIS — I5033 Acute on chronic diastolic (congestive) heart failure: Secondary | ICD-10-CM | POA: Diagnosis not present

## 2019-06-12 DIAGNOSIS — Z7984 Long term (current) use of oral hypoglycemic drugs: Secondary | ICD-10-CM | POA: Diagnosis not present

## 2019-06-12 DIAGNOSIS — Z8249 Family history of ischemic heart disease and other diseases of the circulatory system: Secondary | ICD-10-CM | POA: Diagnosis not present

## 2019-06-12 DIAGNOSIS — K649 Unspecified hemorrhoids: Secondary | ICD-10-CM | POA: Diagnosis not present

## 2019-06-12 DIAGNOSIS — Z6841 Body Mass Index (BMI) 40.0 and over, adult: Secondary | ICD-10-CM | POA: Diagnosis not present

## 2019-06-12 DIAGNOSIS — Z8619 Personal history of other infectious and parasitic diseases: Secondary | ICD-10-CM | POA: Diagnosis not present

## 2019-06-12 DIAGNOSIS — Z9119 Patient's noncompliance with other medical treatment and regimen: Secondary | ICD-10-CM | POA: Diagnosis not present

## 2019-06-12 DIAGNOSIS — K5900 Constipation, unspecified: Secondary | ICD-10-CM | POA: Diagnosis present

## 2019-06-12 DIAGNOSIS — K921 Melena: Secondary | ICD-10-CM | POA: Diagnosis not present

## 2019-06-12 DIAGNOSIS — Z87891 Personal history of nicotine dependence: Secondary | ICD-10-CM | POA: Diagnosis not present

## 2019-06-12 DIAGNOSIS — J9611 Chronic respiratory failure with hypoxia: Secondary | ICD-10-CM | POA: Diagnosis not present

## 2019-06-12 HISTORY — PX: FLEXIBLE SIGMOIDOSCOPY: SHX5431

## 2019-06-12 HISTORY — PX: ESOPHAGOGASTRODUODENOSCOPY (EGD) WITH PROPOFOL: SHX5813

## 2019-06-12 LAB — BPAM RBC
Blood Product Expiration Date: 202010242359
ISSUE DATE / TIME: 202009221326
Unit Type and Rh: 5100

## 2019-06-12 LAB — CBC
HCT: 23.9 % — ABNORMAL LOW (ref 39.0–52.0)
Hemoglobin: 7 g/dL — ABNORMAL LOW (ref 13.0–17.0)
MCH: 23.3 pg — ABNORMAL LOW (ref 26.0–34.0)
MCHC: 29.3 g/dL — ABNORMAL LOW (ref 30.0–36.0)
MCV: 79.7 fL — ABNORMAL LOW (ref 80.0–100.0)
Platelets: 358 10*3/uL (ref 150–400)
RBC: 3 MIL/uL — ABNORMAL LOW (ref 4.22–5.81)
RDW: 18.2 % — ABNORMAL HIGH (ref 11.5–15.5)
WBC: 9.3 10*3/uL (ref 4.0–10.5)
nRBC: 0 % (ref 0.0–0.2)

## 2019-06-12 LAB — TYPE AND SCREEN
ABO/RH(D): O POS
Antibody Screen: NEGATIVE
Unit division: 0

## 2019-06-12 LAB — GLUCOSE, CAPILLARY
Glucose-Capillary: 116 mg/dL — ABNORMAL HIGH (ref 70–99)
Glucose-Capillary: 109 mg/dL — ABNORMAL HIGH (ref 70–99)
Glucose-Capillary: 97 mg/dL (ref 70–99)
Glucose-Capillary: 118 mg/dL — ABNORMAL HIGH (ref 70–99)
Glucose-Capillary: 117 mg/dL — ABNORMAL HIGH (ref 70–99)

## 2019-06-12 LAB — BASIC METABOLIC PANEL
Anion gap: 5 (ref 5–15)
BUN: 9 mg/dL (ref 6–20)
CO2: 29 mmol/L (ref 22–32)
Calcium: 8.5 mg/dL — ABNORMAL LOW (ref 8.9–10.3)
Chloride: 103 mmol/L (ref 98–111)
Creatinine, Ser: 1.24 mg/dL (ref 0.61–1.24)
GFR calc Af Amer: >60 mL/min (ref >60)
GFR calc non Af Amer: >60 mL/min (ref >60)
Glucose, Bld: 115 mg/dL — ABNORMAL HIGH (ref 70–99)
Potassium: 3.6 mmol/L (ref 3.5–5.1)
Sodium: 137 mmol/L (ref 135–145)

## 2019-06-12 SURGERY — ESOPHAGOGASTRODUODENOSCOPY (EGD) WITH PROPOFOL
Anesthesia: Monitor Anesthesia Care

## 2019-06-12 MED ORDER — MIDAZOLAM HCL 5 MG/5ML IJ SOLN
INTRAMUSCULAR | Status: DC | PRN
  Administered 2019-06-12 (×2): 1 mg via INTRAVENOUS

## 2019-06-12 MED ORDER — PROPOFOL 500 MG/50ML IV EMUL
INTRAVENOUS | Status: DC | PRN
  Administered 2019-06-12: 100 ug/kg/min via INTRAVENOUS

## 2019-06-12 MED ORDER — SODIUM CHLORIDE 0.9 % IV SOLN: INTRAVENOUS | Status: DC

## 2019-06-12 MED ORDER — BUTAMBEN-TETRACAINE-BENZOCAINE 2-2-14 % EX AERO
INHALATION_SPRAY | CUTANEOUS | Status: DC | PRN
  Administered 2019-06-12: 2 via TOPICAL

## 2019-06-12 MED ORDER — LACTATED RINGERS IV SOLN
INTRAVENOUS | Status: DC
  Administered 2019-06-12: 10:00:00 via INTRAVENOUS

## 2019-06-12 MED ORDER — SODIUM CHLORIDE 0.9 % IV SOLN
510.0000 mg | Freq: Once | INTRAVENOUS | Status: AC
  Administered 2019-06-12: 510 mg via INTRAVENOUS
  Filled 2019-06-12: qty 17

## 2019-06-12 MED ORDER — TORSEMIDE 20 MG PO TABS
80.0000 mg | ORAL_TABLET | Freq: Two times a day (BID) | ORAL | Status: DC
  Administered 2019-06-12 – 2019-06-13 (×3): 80 mg via ORAL
  Filled 2019-06-12 (×3): qty 4

## 2019-06-12 SURGICAL SUPPLY — 15 items

## 2019-06-12 NOTE — Progress Notes (Signed)
Attending Eulogio Bear paged about HGB of 7.0 . Pt admitted for GI bleed and  1 Unit PRBc transfused for hgb of 7.0 yesterday

## 2019-06-12 NOTE — Progress Notes (Signed)
[facility] PROGRESS NOTE  Benjamin [name]:[id number] DOB: 06/18/1966 DOA: 06/11/2019 PCP: [name], NP  Assessment/Plan:  BRBPR with anemia Patient with recurrent prolonged h/o BRBPR. Multiple scopes reveal hemorrhoids. Home meds include xarelto. No further episodes since admission. Flex sig today reveals external and internal hemorrhoids with one diminutive polyp otherwise normal per GI. Recommends different blood thinner and referral to colorectal surgeon. He received 1 unit PRBC's. Hg remains at 7. His iron low and home meds include iron. He complains rectal pain "from all that pushing". Wonder if stays constipated from iron supplements aggravating hemmorrhoids in setting of blood thinnner.  -cbc tomorrow -IV iron -Continue to monitor for recurrent bleeding  -Soft diet for now -donut cushion -Will continue Aunsol HC BID for now -continue to hold xaerelto  H/o PE in 3/47  He is uncertain whether he had prior DVT/PE in the past. He also has afib and so would also qualify for [facility] for that reason. Home meds include xaerlto. GI recommending changing to eliquis. Of note, chart review indicates admitted 01/21/19 with dyspnea and found to have PE and discharged on coumdin. He was them re-admitted 02/06/19 for syncope/cough/dyspnea found to be COVID + and CT revealed bilateral ground glass opacity. Discharged on O2 after 6 days. -continue to hold xaerlto  Chronic respiratory failure related to obesity/OSA and Heart failure. Reports having been prescribed oxygen at home in past recently.  Reports general non-compliance and states oxygen never delivered. Oxygen saturation level 91% on 6L per flow sheet. Chest xray no active disease.  -will ambulate and check oxygen saturation level  Afib Rate controlled with Labetaolol -Hold Xarelto for now, as above -continue labetalol  Chronic CHF 01/22/19 echo with preserved EF and impaired diastolic relaxation NOS Appears to be compensated.  Home meds include BB and demadex. demadex on hold initially due to #1 -resume demadex -daily weight -resume BB -intake and output.  HTN BP elevated this am likely related to not getting anti-hypertensive meds this am. Home meds include norvasc, demedex, labetolol -resume home meds -monitor BP closely   Super morbid obesity BMI 56. -Outpatient PCP/bariatric medicine/bariatric surgery f/u encouraged  DM A1c was 6.1 on 8/11 -holding Glucophage -Cover with resistant-scale SSI  OSA/OHS/Narcolepsy -Continue qhs Bipap  -Continue prn albuterol  HLD -Continue Lipitor  Code Status: full Family Communication: patient Disposition Plan: home tomorrow once BP controlled and no bleeding once diet resumed   Consultants:  [facility]  Procedures:  EGD 9/23  Flex sig 9/23  Antibiotics:    HPI/Subjective: Sitting in chair. Reports "butt" hurts. Wants to eat  Objective: Vitals:   06/12/19 1105 06/12/19 1206  BP: (!) 176/76 (!) 163/103  Pulse: 89 83  Resp: (!) 24 19  Temp:  98.3 F (36.8 C)  SpO2: 98% 97%    Intake/Output Summary (Last 24 hours) at 06/12/2019 1217 Last data filed at 06/12/2019 1213 Gross per 24 hour  Intake 435 ml  Output -  Net 435 ml   Filed Weights   06/11/19 2154  Weight: (!) 187.2 kg    Exam:   General:  Obese alert no acute distress  Cardiovascular: rrr HS distant trace LE edema  Respiratory: normal effort BS clear bilaterally no wheeze  Abdomen: obese soft +BS no guarding or rebounding  Musculoskeletal: joints without swelling/erythema   Data Reviewed: Basic Metabolic Panel: Recent Labs  Lab 06/11/19 1033 06/12/19 0402  NA 138 137  K 4.0 3.6  CL 103 103  CO2 28 29  GLUCOSE 119* 115*  BUN 10 9  CREATININE 1.22 1.24  CALCIUM 8.5* 8.5*   Liver Function Tests: Recent Labs  Lab 06/11/19 1033  AST 17  ALT 14  ALKPHOS 46  BILITOT 0.3  PROT 6.5  ALBUMIN 3.2*   No results for input(s): LIPASE, AMYLASE in the last  168 hours. No results for input(s): AMMONIA in the last 168 hours. CBC: Recent Labs  Lab 06/11/19 1033 06/12/19 0402  WBC 7.8 9.3  NEUTROABS 4.5  --   HGB 7.0* 7.0*  HCT 24.5* 23.9*  MCV 79.8* 79.7*  PLT 351 358   Cardiac Enzymes: No results for input(s): CKTOTAL, CKMB, CKMBINDEX, TROPONINI in the last 168 hours. BNP (last 3 results) Recent Labs    05/07/19 1129 05/12/19 0140 05/28/19 1108  BNP 17.1 12.8 25.1    ProBNP (last 3 results) No results for input(s): PROBNP in the last 8760 hours.  CBG: Recent Labs  Lab 06/11/19 1702 06/11/19 2355 06/12/19 0743 06/12/19 0951 06/12/19 1204  GLUCAP 123* 125* 116* 109* 97    Recent Results (from the past 240 hour(s))  SARS CORONAVIRUS 2 (TAT 6-24 HRS) Nasopharyngeal Nasopharyngeal Swab     Status: None   Collection Time: 06/11/19  1:06 PM   Specimen: Nasopharyngeal Swab  Result Value Ref Range Status   SARS Coronavirus 2 NEGATIVE NEGATIVE Final    Comment: (NOTE) SARS-CoV-2 target nucleic acids are NOT DETECTED. The SARS-CoV-2 RNA is generally detectable in upper and lower respiratory specimens during the acute phase of infection. Negative results do not preclude SARS-CoV-2 infection, do not rule out co-infections with other pathogens, and should not be used as the sole basis for treatment or other patient management decisions. Negative results must be combined with clinical observations, patient history, and epidemiological information. The expected result is Negative. Fact Sheet for Patients: HairSlick.no Fact Sheet for Healthcare Providers: quierodirigir.com This test is not yet approved or cleared by the [place] FDA and  has been authorized for detection and/or diagnosis of SARS-CoV-2 by FDA under an Emergency Use Authorization (EUA). This EUA will remain  in effect (meaning this test can be used) for the duration of the COVID-19 declaration under  Section 56 4(b)(1) of the Act, 21 U.S.C. section 360bbb-3(b)(1), unless the authorization is terminated or revoked sooner. Performed at [facility], [address], [place], [place] [address]      Studies: Dg Chest Portable 1 View  Result Date: 06/11/2019 CLINICAL DATA:  GI bleed. EXAM: PORTABLE CHEST 1 VIEW COMPARISON:  05/12/2019 FINDINGS: The heart size and mediastinal contours are within normal limits. Both lungs are clear. The visualized skeletal structures are unremarkable. IMPRESSION: No active disease. Electronically Signed   By: Francene Boyers M.D.   On: 06/11/2019 10:22    Scheduled Meds: . amLODipine  10 mg Oral Daily  . atorvastatin  20 mg Oral QHS  . hydrocortisone  25 mg Rectal BID  . hydroxypropyl methylcellulose / hypromellose  1 drop Both Eyes Daily  . insulin aspart  0-20 Units Subcutaneous TID WC  . insulin aspart  0-5 Units Subcutaneous QHS  . labetalol  200 mg Oral BID  . methocarbamol  500 mg Oral TID  . pantoprazole  40 mg Oral Daily  . torsemide  80 mg Oral BID   Continuous Infusions: . ferumoxytol      Principal Problem:   Rectal bleeding Active Problems:   Anemia   Lower GI bleeding   CHF (congestive heart failure) (HCC)  Diabetes mellitus type 2 in obese (HCC)   Morbid obesity with BMI of 50.0-59.9, adult (HCC)   Chronic diastolic CHF (congestive heart failure) (HCC)   PAF (paroxysmal atrial fibrillation) (HCC)   Hypertension   PE (pulmonary thromboembolism) (HCC)   OSA on CPAP   HLD (hyperlipidemia)    Time spent: 45 minutes    [facility] M NP  [facility]  If 7PM-7AM, please contact night-coverage at [email or web address], password [facility] 06/12/2019, 12:17 PM  LOS: 0 days

## 2019-06-12 NOTE — Progress Notes (Signed)
SATURATION QUALIFICATIONS: (This note is used to comply with regulatory documentation for home oxygen)  Patient Saturations on Room Air at Rest = 96%  Patient Saturations on Room Air while Ambulating = 86%  Patient Saturations on 2 Liters of oxygen while Ambulating = 95%  Please briefly explain why patient needs home oxygen:  Pt becomes short of breathe at rest when answer open ended questions. He becomes very fatigued within walking 20 yards. Pt's O2 level drops below therapeutic level within a few steps of walking. RN placed pt on 2L in his room once done with ambulatory test.

## 2019-06-12 NOTE — Anesthesia Postprocedure Evaluation (Signed)
Anesthesia Post Note  Patient: Benjamin [name]  Procedure(s) Performed: ESOPHAGOGASTRODUODENOSCOPY (EGD) WITH PROPOFOL (N/A ) FLEXIBLE SIGMOIDOSCOPY (N/A )     Patient location during evaluation: Endoscopy Anesthesia Type: MAC Level of consciousness: awake and alert Pain management: pain level controlled Vital Signs Assessment: post-procedure vital signs reviewed and stable Respiratory status: spontaneous breathing, nonlabored ventilation, respiratory function stable and patient connected to nasal cannula oxygen Cardiovascular status: blood pressure returned to baseline and stable Postop Assessment: no apparent nausea or vomiting Anesthetic complications: no    Last Vitals:  Vitals:   06/12/19 1105 06/12/19 1206  BP: (!) 176/76 (!) 163/103  Pulse: 89 83  Resp: (!) 24 19  Temp:  36.8 C  SpO2: 98% 97%    Last Pain:  Vitals:   06/12/19 1206  TempSrc: Oral  PainSc:                  Thad Osoria L [place] Krotz

## 2019-06-12 NOTE — Anesthesia Preprocedure Evaluation (Addendum)
Anesthesia Evaluation  Patient identified by MRN, date of birth, ID band Patient awake    Reviewed: Allergy & Precautions, NPO status , Patient's Chart, lab work & pertinent test results  Airway Mallampati: III  TM Distance: >3 FB Neck ROM: Full    Dental no notable dental hx. (+) Teeth Intact, Dental Advisory Given   Pulmonary shortness of breath and Long-Term Oxygen Therapy, sleep apnea , former smoker, PE COVID positive in June now negative   Pulmonary exam normal breath sounds clear to auscultation       Cardiovascular hypertension, +CHF  Normal cardiovascular exam+ dysrhythmias Atrial Fibrillation  Rhythm:Regular Rate:Normal  RHC 2020 1. Right and left heart filling pressures are elevated but not markedly elevated.  2. Pulmonary venous hypertension.  3. Preserved cardiac output.   TTE 2020 Normal EF   Neuro/Psych negative psych ROS   GI/Hepatic Neg liver ROS, GERD  Medicated,  Endo/Other  diabetes, Oral Hypoglycemic AgentsMorbid obesity  Renal/GU negative Renal ROS  negative genitourinary   Musculoskeletal negative musculoskeletal ROS (+)   Abdominal   Peds  Hematology  (+) Blood dyscrasia (on xarelto), ,   Anesthesia Other Findings GIB  Reproductive/Obstetrics                            Anesthesia Physical Anesthesia Plan  ASA: III  Anesthesia Plan: MAC   Post-op Pain Management:    Induction: Intravenous  PONV Risk Score and Plan: Propofol infusion and Treatment may vary due to age or medical condition  Airway Management Planned: Natural Airway  Additional Equipment:   Intra-op Plan:   Post-operative Plan:   Informed Consent: I have reviewed the patients History and Physical, chart, labs and discussed the procedure including the risks, benefits and alternatives for the proposed anesthesia with the patient or authorized representative who has indicated his/her  understanding and acceptance.     Dental advisory given  Plan Discussed with: CRNA  Anesthesia Plan Comments:         Anesthesia Quick Evaluation

## 2019-06-12 NOTE — Transfer of Care (Signed)
Immediate Anesthesia Transfer of Care Note  Patient: Benjamin [name]  Procedure(s) Performed: ESOPHAGOGASTRODUODENOSCOPY (EGD) WITH PROPOFOL (N/A ) FLEXIBLE SIGMOIDOSCOPY (N/A )  Patient Location: Endoscopy Unit  Anesthesia Type:MAC  Level of Consciousness: awake, alert  and oriented  Airway & Oxygen Therapy: Patient Spontanous Breathing and Patient connected to nasal cannula oxygen  Post-op Assessment: Report given to RN and Post -op Vital signs reviewed and stable  Post vital signs: Reviewed and stable  Last Vitals:  Vitals Value Taken Time  BP 185/96 06/12/19 1054  Temp 37.1 C 06/12/19 1054  Pulse 96 06/12/19 1058  Resp 24 06/12/19 1058  SpO2 95 % 06/12/19 1058  Vitals shown include unvalidated device data.  Last Pain:  Vitals:   06/12/19 1054  TempSrc: Oral  PainSc: 0-No pain         Complications: No apparent anesthesia complications

## 2019-06-12 NOTE — Progress Notes (Signed)
   06/11/19 2154  Vitals  Temp 99.7 F (37.6 C)  Temp Source Oral  BP (!) 147/87  MAP (mmHg) 104  BP Location Left Arm  BP Method Automatic  Patient Position (if appropriate) Sitting  Pulse Rate 88  Pulse Rate Source Dinamap  Resp (!) 22  Oxygen Therapy  SpO2 96 %  O2 Device Room Air  Pain Assessment  Pain Scale 0-10  Pain Score 0  Height and Weight  Height 5\' 11"  (1.803 m)  Weight (!) 187.2 kg  Type of Scale Used Standing  Type of Weight Actual  BSA (Calculated - sq m) 3.06 sq meters  BMI (Calculated) 57.59  Weight in (lb) to have BMI = 25 178.9  MEWS Score  MEWS RR 1  MEWS Pulse 0  MEWS Systolic 0  MEWS LOC 0  MEWS Temp 0  MEWS Score 1  MEWS Score Color Green   Assumed care of  Benjamin [name]. Pt was oriented to unit and falls education completed. Pt able to verbalize understanding to risks associated with falls. Pt with Hemorrhoids otherwise skin intact. Skin assessed with Halina Maidens. To monitor and treat pt per MD and nursing orders

## 2019-06-12 NOTE — Progress Notes (Signed)
Benjamin [name] 10:17 AM  Subjective: Patient seen and examined in his hospital computer chart reviewed and his case discussed with my partner Dr. Alessandra Bevels and is continued to have some bleeding at home on blood thinners and he did use his suppositories and is hungry and wants to eat  Objective: Vital signs stable afebrile exam please see preassessment evaluation labs reviewed previous work-up reviewed upper abdomen okay on CT Assessment: GI blood loss questionable etiology and patient on blood thinners for pulmonary embolus  Plan: Okay to proceed with endoscopy and flex sig with anesthesia assistance  [facility]  office [phone] After 5PM or if no answer call [phone]

## 2019-06-12 NOTE — Op Note (Signed)
[facility] Patient Name: Benjamin [name] Procedure Date : 06/12/2019 MRN: [id number] Attending MD: Vida Rigger , MD Date of Birth: 1966/07/30 CSN: [id number] Age: 53 Admit Type: Inpatient Procedure:                Flexible Sigmoidoscopy Indications:              Hematochezia Providers:                Vida Rigger, MD, Dayton Bailiff, RN, Zoe Lan,                            RN, Brion Aliment, Technician Referring MD:              Medicines:                Propofol per Anesthesia again some respiratory                            depression minimal sedation would recommend astasia                            if endoscopic procedures are needed in the future Complications:            No immediate complications. Estimated Blood Loss:     Estimated blood loss: none. Procedure:                Pre-Anesthesia Assessment:                           - Prior to the procedure, a History and Physical                            was performed, and patient medications and                            allergies were reviewed. The patient's tolerance of                            previous anesthesia was also reviewed. The risks                            and benefits of the procedure and the sedation                            options and risks were discussed with the patient.                            All questions were answered, and informed consent                            was obtained. Prior Anticoagulants: The patient has                            taken Xarelto (rivaroxaban), last dose was 2 days  prior to procedure. ASA Grade Assessment: III - A                            patient with severe systemic disease. After                            reviewing the risks and benefits, the patient was                            deemed in satisfactory condition to undergo the                            procedure.                           - Prior to the  procedure, a History and Physical                            was performed, and patient medications and                            allergies were reviewed. The patient's tolerance of                            previous anesthesia was also reviewed. The risks                            and benefits of the procedure and the sedation                            options and risks were discussed with the patient.                            All questions were answered, and informed consent                            was obtained. Prior Anticoagulants: The patient has                            taken Xarelto (rivaroxaban), last dose was 2 days                            prior to procedure. ASA Grade Assessment: III - A                            patient with severe systemic disease. After                            reviewing the risks and benefits, the patient was                            deemed in satisfactory condition to undergo the  procedure.                           After obtaining informed consent, the scope was                            passed under direct vision. The PCF-H190DL                            ([id number]) Olympus pediatric colonoscope was                            introduced through the anus and advanced to the the                            descending colon. The flexible sigmoidoscopy was                            accomplished without difficulty. The patient                            tolerated the procedure. The quality of the bowel                            preparation was adequate. Scope In: 10:40:53 AM Scope Out: 10:43:51 AM Total Procedure Duration: 0 hours 2 minutes 58 seconds  Findings:      External and internal hemorrhoids were found during retroflexion, during       perianal exam and during digital exam. The hemorrhoids were small.      A diminutive polyp was found in the mid sigmoid colon. The polyp was       hyperplastic.      A  moderate amount of semi-solid stool was found in the descending colon,       interfering with visualization. No blood was seen coming from above nor       was blood seen coming from above on Dr. Hulen Shouts previous colonoscopy 2       months ago      The exam was otherwise without abnormality. Impression:               - External and internal hemorrhoids.                           - One diminutive polyp in the mid sigmoid colon.                           - Stool in the descending colon.                           - The examination was otherwise normal. No blood                            seen coming from above                           - No specimens collected. Recommendation:           -  Soft diet today.                           - Continue present medications. Would reevaluate                            trying a different blood thinner like possibly                            Eliquis                           - Telephone GI clinic if symptomatic PRN.                           - Return to GI clinic PRN.                           - Refer to a colo-rectal surgeon today to proceed                            with hemorrhoidal therapy either banding injection                            or other methods. Procedure Code(s):        --- Professional ---                           [phone], Sigmoidoscopy, flexible; diagnostic,                            including collection of specimen(s) by brushing or                            washing, when performed (separate procedure) Diagnosis Code(s):        --- Professional ---                           K63.5, Polyp of colon                           K64.8, Other hemorrhoids                           K92.1, Melena (includes Hematochezia) CPT copyright 2019 American Medical Association. All rights reserved. The codes documented in this report are preliminary and upon coder review may  be revised to meet current compliance requirements. Clarene Essex,  MD 06/12/2019 10:57:03 AM This report has been signed electronically. Number of Addenda: 0

## 2019-06-12 NOTE — Op Note (Signed)
[facility] Patient Name: Benjamin [name] Procedure Date : 06/12/2019 MRN: [id number] Attending MD: Vida Rigger , MD Date of Birth: [birth year] CSN: [id number] Age: 53 Admit Type: Inpatient Procedure:                Upper GI endoscopy Indications:              Acute post hemorrhagic anemia, Hematochezia, Melena Providers:                Vida Rigger, MD, Dayton Bailiff, RN, Zoe Lan,                            RN, Brion Aliment, Technician Referring MD:              Medicines:                Propofol per Anesthesia but less than 100 mg given                            and 2 mg of Versed for agitation and recommend                            using general anesthesia if GI procedure is needed                            in the future Complications:            No immediate complications. Estimated Blood Loss:     Estimated blood loss: none. Procedure:                Pre-Anesthesia Assessment:                           - Prior to the procedure, a History and Physical                            was performed, and patient medications and                            allergies were reviewed. The patient's tolerance of                            previous anesthesia was also reviewed. The risks                            and benefits of the procedure and the sedation                            options and risks were discussed with the patient.                            All questions were answered, and informed consent                            was obtained. Prior Anticoagulants: The patient has  taken Xarelto (rivaroxaban), last dose was 2 days                            prior to procedure. ASA Grade Assessment: III - A                            patient with severe systemic disease. After                            reviewing the risks and benefits, the patient was                            deemed in satisfactory condition to undergo the                 procedure.                           After obtaining informed consent, the endoscope was                            passed under direct vision. Throughout the                            procedure, the patient's blood pressure, pulse, and                            oxygen saturations were monitored continuously. The                            PCF-H190DL ([id number]) Olympus pediatric colonoscope                            was introduced through the mouth, and advanced to                            the jejunum. The upper GI endoscopy was somewhat                            difficult due to the patient's respiratory                            instability (hypoxia). Successful completion of the                            procedure was aided by performing the maneuvers                            documented (below) in this report. And controlled                            by anesthesia team with increasing oxygen and                            proceeding with very quick procedure and bagging  the patient afterward Scope In: Scope Out: Findings:      The examined esophagus was normal.      The entire examined stomach was normal.      The duodenal bulb, first portion of the duodenum, second portion of the       duodenum, third portion of the duodenum and fourth portion of the       duodenum were normal.      The examined jejunum was normal.      The exam was otherwise without abnormality. Impression:               - Normal esophagus.                           - Normal stomach.                           - Normal duodenal bulb, first portion of the                            duodenum, second portion of the duodenum, third                            portion of the duodenum and fourth portion of the                            duodenum.                           - Normal examined jejunum.                           - The examination was otherwise normal. No  blood                            was seen throughout the exam which was very quick                            due to respiratory instability as above                           - No specimens collected. Recommendation:           - Perform a flexible sigmoidoscopy today. Procedure Code(s):        --- Professional ---                           [id number], Esophagogastroduodenoscopy, flexible,                            transoral; diagnostic, including collection of                            specimen(s) by brushing or washing, when performed                            (separate procedure) Diagnosis Code(s):        --- Professional ---  D62, Acute posthemorrhagic anemia                           K92.1, Melena (includes Hematochezia) CPT copyright 2019 American Medical Association. All rights reserved. The codes documented in this report are preliminary and upon coder review may  be revised to meet current compliance requirements. Clarene Essex, MD 06/12/2019 10:50:55 AM This report has been signed electronically. Number of Addenda: 0

## 2019-06-13 ENCOUNTER — Other Ambulatory Visit (HOSPITAL_COMMUNITY): Payer: Self-pay

## 2019-06-13 ENCOUNTER — Telehealth (HOSPITAL_COMMUNITY): Payer: Self-pay | Admitting: Licensed Clinical Social Worker

## 2019-06-13 LAB — CBC
HCT: 24 % — ABNORMAL LOW (ref 39.0–52.0)
Hemoglobin: 7.3 g/dL — ABNORMAL LOW (ref 13.0–17.0)
MCH: 23.9 pg — ABNORMAL LOW (ref 26.0–34.0)
MCHC: 30.4 g/dL (ref 30.0–36.0)
MCV: 78.7 fL — ABNORMAL LOW (ref 80.0–100.0)
Platelets: 369 10*3/uL (ref 150–400)
RBC: 3.05 MIL/uL — ABNORMAL LOW (ref 4.22–5.81)
RDW: 18.3 % — ABNORMAL HIGH (ref 11.5–15.5)
WBC: 8.7 10*3/uL (ref 4.0–10.5)
nRBC: 0.5 % — ABNORMAL HIGH (ref 0.0–0.2)

## 2019-06-13 LAB — GLUCOSE, CAPILLARY
Glucose-Capillary: 139 mg/dL — ABNORMAL HIGH (ref 70–99)
Glucose-Capillary: 127 mg/dL — ABNORMAL HIGH (ref 70–99)

## 2019-06-13 NOTE — Plan of Care (Signed)
Plan of care met for discharge

## 2019-06-13 NOTE — Progress Notes (Signed)
NCM received consult regarding Eliquis.Discharging MD made NCM aware pt will resume Xaralto instead of switching to Eliquis. No need for Eliquis consultation. Pt is aware of plan and will f/u with PCP, hospital f/u appointment noted on AVS.

## 2019-06-13 NOTE — Progress Notes (Signed)
Pt released from hosp today.  Came out to do his pill box.   He was to call [facility] for a follow up for surgery consult- that call was made. They said he would have to speak to someone in billing-- He has balance on his account-$382 from a visit back in June. He has to pay on his account of $166.60  He also needs blood work done in a week.  So we need to schedule that next week.  Spoke to [name] about the GI referral and issue noted above and she will assist with getting more info on this and how to move forward if we can.   [name], EMT-Paramedic  06/13/19

## 2019-06-13 NOTE — TOC Initial Note (Signed)
Transition of Care [facility]) - Initial/Assessment Note    Patient Details  Name: Benjamin [name] MRN: [id number] Date of Birth: 17-Oct-1965  [facility] CM/SW Contact:    Benard Halsted, LCSW Phone Number: 06/13/2019, 12:19 PM  Clinical Narrative:                 CSW completed high risk screening assessment with patient. He reports that he will be returning to the halfway house that he has been staying at upon discharge. They will come and pick him up. He denied need for other resources. Patient is established at [facility]. No other needs identified at this time.    Expected Discharge Plan: Home/Self Care Barriers to Discharge: No Barriers Identified   Patient Goals and CMS Choice Patient states their goals for this hospitalization and ongoing recovery are:: Return to halfway house CMS Medicare.gov Compare Post Acute Care list provided to:: Patient Choice offered to / list presented to : NA  Expected Discharge Plan and Services Expected Discharge Plan: Home/Self Care In-house Referral: Clinical Social Work   Post Acute Care Choice: NA Living arrangements for the past 2 months: [employer]) Expected Discharge Date: 06/13/19               DME Arranged: N/A         HH Arranged: NA          Prior Living Arrangements/Services Living arrangements for the past 2 months: Materials engineer) Lives with:: Other (Comment)(roommates) Patient language and need for interpreter reviewed:: Yes Do you feel safe going back to the place where you live?: Yes      Need for Family Participation in Patient Care: No (Comment) Care giver support system in place?: No (comment) Current home services: DME Criminal Activity/Legal Involvement Pertinent to Current Situation/Hospitalization: No - Comment as needed  Activities of Daily Living Home Assistive Devices/Equipment: CPAP ADL Screening (condition at time of  admission) Patient's cognitive ability adequate to safely complete daily activities?: Yes Is the patient deaf or have difficulty hearing?: No Does the patient have difficulty seeing, even when wearing glasses/contacts?: No Does the patient have difficulty concentrating, remembering, or making decisions?: No Patient able to express need for assistance with ADLs?: Yes Does the patient have difficulty dressing or bathing?: No Independently performs ADLs?: Yes (appropriate for developmental age) Does the patient have difficulty walking or climbing stairs?: No Weakness of Legs: None Weakness of Arms/Hands: None  Permission Sought/Granted                  Emotional Assessment Appearance:: Appears stated age Attitude/Demeanor/Rapport: Gracious, Engaged Affect (typically observed): Accepting, Appropriate, Pleasant Orientation: : Oriented to Self, Oriented to Place, Oriented to  Time, Oriented to Situation Alcohol / Substance Use: Not Applicable Psych Involvement: No (comment)  Admission diagnosis:  Lower GI bleed [K92.2] Anemia, unspecified type [D64.9] Patient Active Problem List   Diagnosis Date Noted  . BRBPR (bright red blood per rectum) 06/12/2019  . Hypertension   . CHF (congestive heart failure) ([place])   . Sleep apnea   . Chronic respiratory failure ([place])   . Lower GI bleeding 06/11/2019  . Hypokalemia 05/29/2019  . GI bleed 05/29/2019  . Anemia 05/28/2019  . Acute on chronic diastolic CHF (congestive heart failure) ([place]) 04/11/2019  . Iron deficiency anemia 03/11/2019  . OSA on CPAP 03/11/2019  . HLD (hyperlipidemia) 03/11/2019  . Elevated troponin 03/11/2019  . GERD (gastroesophageal reflux  disease) 03/11/2019  . Rectal bleeding 02/20/2019  . Dyspnea 02/06/2019  . COVID-19 virus infection 02/06/2019  . Bilateral lower extremity edema   . Morbid obesity with BMI of 50.0-59.9, adult (HCC)   . Chronic diastolic CHF (congestive heart failure) (HCC)   . PAF (paroxysmal  atrial fibrillation) (HCC)   . PE (pulmonary thromboembolism) (HCC) 01/21/2019  . Hypertensive urgency 01/21/2019  . Diabetes mellitus type 2 in obese (HCC) 01/21/2019   PCP:  Claiborne Rigg, NP Pharmacy:   [facility] - [place], [place] - [address] [place] [address] Phone: [phone] Fax: [phone]  [facility] - [place], [place] - [address] [place] [address] Phone: [phone] Fax: [phone]     Social Determinants of Health (SDOH) Interventions    Readmission Risk Interventions Readmission Risk Prevention Plan 06/13/2019 04/19/2019  Transportation Screening Complete Complete  PCP or Specialist Appt within 3-5 Days - Complete  HRI or Home Care Consult - Complete  Social Work Consult for Recovery Care Planning/Counseling - Complete  Palliative Care Screening - Not Applicable  Medication Review [employer]) Complete Complete  PCP or Specialist appointment within 3-5 days of discharge Complete -  HRI or Home Care Consult Complete -  SW Recovery Care/Counseling Consult Complete -  Palliative Care Screening Not Applicable -  Skilled Nursing Facility Not Applicable -  Some recent data might be hidden

## 2019-06-13 NOTE — Progress Notes (Signed)
Patient was stable at discharge.  We reviewed the discharge education. Patient/Family verbalized understanding and had no further questions. Patient left with belongings in hand.

## 2019-06-13 NOTE — Discharge Summary (Signed)
Physician Discharge Summary  Benjamin [name] BJS:[id number] DOB: 1966/04/29 DOA: 06/11/2019  PCP: [name], NP  Admit date: 06/11/2019 Discharge date: 06/13/2019  Admitted From: Home Disposition:  Home  Discharge Condition:Stable CODE STATUS:FULL Diet recommendation: Heart Healthy   Brief/Interim Summary:  Patient is a 53 year old male with history of paroxysmal A. fib on Xarelto, history of PE in 2020, COVID-19 with full recovery, OSA on CPAP, morbid obesity, rectal bleeding, hypertension, diabetes, congestive heart failure, OSH who presents with complaints of recurrent rectal bleeding.  He has history of hemorrhoids.  He remained hemodynamically stable after admission.  He was given 1 unit of PRBC transfusion.  Iron was low so he was given IV iron.  Patient was evaluated by GI and he underwent flexible sigmoidoscopy which showed external/internal hemorrhoids.  He remains hemodynamically stable today.  Plan is to follow-up with general surgery as an outpatient for the management of hemorrhoids.  He is stable for discharge home today.  Following problems were addressed during his hospitalization:    BRBPR with anemia: Patient with recurrent prolonged h/o BRBPR. Multiple scopes reveal hemorrhoids. Home meds include xarelto.Flex sig revelaed external and internal hemorrhoids with one diminutive polyp otherwise normal per GI. Marland Kitchen He received 1 unit PRBC's. Hg remains at 7. His iron low and home meds include iron.Given IV iron. Check CBC in a week.  H/o PEin 6/07: He is uncertain whether he had prior DVT/PE in the past. He also has afib and so would also qualify for [facility] for that reason. Home meds include xaerlto. Chart review indicates admitted 01/21/19 with dyspnea and found to have PE and discharged on coumdin. He was them re-admitted 02/06/19 for syncope/cough/dyspnea found to be COVID + and CT revealed bilateral ground glass opacity. Discharged on O2 after 6 days.Resume anticoagulation  on discharge.  Chronic respiratory failure related to obesity/OSA and Heart failure: On home oxygen at 2 L/min.  Afib; Rate controlled with Labetalol. On Xarelto for anticoagulation   Chronic CHF :01/22/19 echo with preserved EF and impaired diastolic relaxation NOS. Appears to be compensated. Home meds include BB and demadex.   HTN :. Home meds include norvasc, demedex, labetolol -resume home meds   Super morbid obesity: BMI56. -Outpatient PCP/bariatric medicine/bariatric surgery f/u encouraged  DM A1cwas 6.1 on 8/11  OSA/OHS/Narcolepsy -Continue qhsBipap   HLD -Continue Lipitor   Discharge Diagnoses:  Principal Problem:   Rectal bleeding Active Problems:   PE (pulmonary thromboembolism) ([place])   Diabetes mellitus type 2 in obese ([place])   Morbid obesity with BMI of 50.0-59.9, adult (HCC)   Chronic diastolic CHF (congestive heart failure) (HCC)   PAF (paroxysmal atrial fibrillation) (HCC)   OSA on CPAP   HLD (hyperlipidemia)   Anemia   Lower GI bleeding   Hypertension   CHF (congestive heart failure) (HCC)   Chronic respiratory failure (HCC)   BRBPR (bright red blood per rectum)    Discharge Instructions  Discharge Instructions    Diet - low sodium heart healthy   Complete by: As directed    Discharge instructions   Complete by: As directed    1) Follow up with general surgery in a week.  Name and number of the provider group has been attached. 2)Do a CBC test to check your hemoglobin in a week.   Increase activity slowly   Complete by: As directed      Allergies as of 06/13/2019   No Known Allergies     Medication List    TAKE these  medications   albuterol 108 (90 Base) MCG/ACT inhaler Commonly known as: VENTOLIN HFA Inhale 2 puffs into the lungs every 6 (six) hours as needed for wheezing or shortness of breath.   amLODipine 10 MG tablet Commonly known as: NORVASC Take 1 tablet (10 mg total) by mouth daily.   atorvastatin 20 MG  tablet Commonly known as: LIPITOR Take 1 tablet (20 mg total) by mouth at bedtime.   carboxymethylcellulose 0.5 % Soln Commonly known as: REFRESH PLUS Place 1 drop into both eyes daily.   docusate sodium 100 MG capsule Commonly known as: COLACE Take 1 capsule (100 mg total) by mouth 2 (two) times daily.   ferrous sulfate 325 (65 FE) MG tablet Take 1 tablet (325 mg total) by mouth daily. Patient will pick up scripts today.   hydrocortisone 25 MG suppository Commonly known as: ANUSOL-HC Place 1 suppository (25 mg total) rectally 2 (two) times daily.   labetalol 200 MG tablet Commonly known as: NORMODYNE Take 1 tablet (200 mg total) by mouth 2 (two) times daily.   levalbuterol 45 MCG/ACT inhaler Commonly known as: XOPENEX HFA Inhale 2 puffs into the lungs every 4 (four) hours as needed for wheezing.   metFORMIN 500 MG tablet Commonly known as: GLUCOPHAGE Take 1 tablet (500 mg total) by mouth 2 (two) times a day. Patient will pick up scripts today. What changed: when to take this   methocarbamol 500 MG tablet Commonly known as: Robaxin Take 1 tablet (500 mg total) by mouth 3 (three) times daily.   omeprazole 20 MG capsule Commonly known as: PRILOSEC Take 1 capsule (20 mg total) by mouth daily.   polyethylene glycol 17 g packet Commonly known as: MIRALAX / GLYCOLAX Take 17 g by mouth daily as needed for severe constipation.   potassium chloride SA 20 MEQ tablet Commonly known as: K-DUR Take 2 tablets (40 mEq total) by mouth 2 (two) times daily.   rivaroxaban 20 MG Tabs tablet Commonly known as: XARELTO Take 1 tablet (20 mg total) by mouth daily with supper.   senna-docusate 8.6-50 MG tablet Commonly known as: Senokot-S Take 2 tablets by mouth 2 (two) times daily.   torsemide 20 MG tablet Commonly known as: DEMADEX Take 4 tablets (80 mg total) by mouth 2 (two) times daily.   True Metrix Blood Glucose Test test strip Generic drug: glucose blood Use as  instructed. Check blood glucose level by fingerstick twice per day.   True Metrix Meter w/Device Kit Use as instructed. Check blood glucose level by fingerstick twice per day.   TRUEplus Lancets 28G Misc Use as instructed. Check blood glucose level by fingerstick twice per day.      Follow-up Information    Surgery, [facility]. Schedule an appointment as soon as possible for a visit in 1 week(s).   Specialty: General Surgery Contact information: [address] [place] [address] [phone]          No Known Allergies  Consultations:  GI   Procedures/Studies: Dg Chest Portable 1 View  Result Date: 06/11/2019 CLINICAL DATA:  GI bleed. EXAM: PORTABLE CHEST 1 VIEW COMPARISON:  05/12/2019 FINDINGS: The heart size and mediastinal contours are within normal limits. Both lungs are clear. The visualized skeletal structures are unremarkable. IMPRESSION: No active disease. Electronically Signed   By: Lorriane Shire M.D.   On: 06/11/2019 10:22       Subjective:  Patient seen and examined the bedside this morning.  Hemodynamically stable for discharge. Discharge  Exam: Vitals:   06/12/19 2104 06/13/19 0510  BP: (!) 161/92 (!) 131/55  Pulse: 87 85  Resp:    Temp: 98.9 F (37.2 C) 98.9 F (37.2 C)  SpO2: 94% 95%   Vitals:   06/12/19 1206 06/12/19 2104 06/13/19 0500 06/13/19 0510  BP: (!) 163/103 (!) 161/92  (!) 131/55  Pulse: 83 87  85  Resp: 19     Temp: 98.3 F (36.8 C) 98.9 F (37.2 C)  98.9 F (37.2 C)  TempSrc: Oral     SpO2: 97% 94%  95%  Weight:   (!) 186.8 kg   Height:        General: Pt is alert, awake, not in acute distress Cardiovascular: RRR, S1/S2 +, no rubs, no gallops Respiratory: CTA bilaterally, no wheezing, no rhonchi Abdominal: Soft, NT, ND, bowel sounds + Extremities: no edema, no cyanosis    The results of significant diagnostics from this hospitalization (including imaging, microbiology, ancillary and laboratory)  are listed below for reference.     Microbiology: Recent Results (from the past 240 hour(s))  SARS CORONAVIRUS 2 (TAT 6-24 HRS) Nasopharyngeal Nasopharyngeal Swab     Status: None   Collection Time: 06/11/19  1:06 PM   Specimen: Nasopharyngeal Swab  Result Value Ref Range Status   SARS Coronavirus 2 NEGATIVE NEGATIVE Final    Comment: (NOTE) SARS-CoV-2 target nucleic acids are NOT DETECTED. The SARS-CoV-2 RNA is generally detectable in upper and lower respiratory specimens during the acute phase of infection. Negative results do not preclude SARS-CoV-2 infection, do not rule out co-infections with other pathogens, and should not be used as the sole basis for treatment or other patient management decisions. Negative results must be combined with clinical observations, patient history, and epidemiological information. The expected result is Negative. Fact Sheet for Patients: SugarRoll.be Fact Sheet for Healthcare Providers: https://www.woods-mathews.com/ This test is not yet approved or cleared by the [place] FDA and  has been authorized for detection and/or diagnosis of SARS-CoV-2 by FDA under an Emergency Use Authorization (EUA). This EUA will remain  in effect (meaning this test can be used) for the duration of the COVID-19 declaration under Section 56 4(b)(1) of the Act, 21 U.S.C. section 360bbb-3(b)(1), unless the authorization is terminated or revoked sooner. Performed at [facility], [place] [address] [place], [place] [address]      Labs: BNP (last 3 results) Recent Labs    05/07/19 1129 05/12/19 0140 05/28/19 1108  BNP 17.1 12.8 18.2   Basic Metabolic Panel: Recent Labs  Lab 06/11/19 1033 06/12/19 0402  NA 138 137  K 4.0 3.6  CL 103 103  CO2 28 29  GLUCOSE 119* 115*  BUN 10 9  CREATININE 1.22 1.24  CALCIUM 8.5* 8.5*   Liver Function Tests: Recent Labs  Lab 06/11/19 1033  AST 17  ALT 14  ALKPHOS  46  BILITOT 0.3  PROT 6.5  ALBUMIN 3.2*   No results for input(s): LIPASE, AMYLASE in the last 168 hours. No results for input(s): AMMONIA in the last 168 hours. CBC: Recent Labs  Lab 06/11/19 1033 06/12/19 0402 06/13/19 0242  WBC 7.8 9.3 8.7  NEUTROABS 4.5  --   --   HGB 7.0* 7.0* 7.3*  HCT 24.5* 23.9* 24.0*  MCV 79.8* 79.7* 78.7*  PLT 351 358 369   Cardiac Enzymes: No results for input(s): CKTOTAL, CKMB, CKMBINDEX, TROPONINI in the last 168 hours. BNP: Invalid input(s): POCBNP CBG: Recent Labs  Lab 06/12/19 0951 06/12/19  1204 06/12/19 1709 06/12/19 2106 06/13/19 0759  GLUCAP 109* 97 118* 117* 139*   D-Dimer No results for input(s): DDIMER in the last 72 hours. Hgb A1c No results for input(s): HGBA1C in the last 72 hours. Lipid Profile No results for input(s): CHOL, HDL, LDLCALC, TRIG, CHOLHDL, LDLDIRECT in the last 72 hours. Thyroid function studies No results for input(s): TSH, T4TOTAL, T3FREE, THYROIDAB in the last 72 hours.  Invalid input(s): FREET3 Anemia work up No results for input(s): VITAMINB12, FOLATE, FERRITIN, TIBC, IRON, RETICCTPCT in the last 72 hours. Urinalysis    Component Value Date/Time   COLORURINE STRAW (A) 04/13/2019 0044   APPEARANCEUR CLEAR 04/13/2019 0044   LABSPEC 1.009 04/13/2019 0044   PHURINE 5.0 04/13/2019 0044   GLUCOSEU NEGATIVE 04/13/2019 0044   HGBUR NEGATIVE 04/13/2019 0044   BILIRUBINUR NEGATIVE 04/13/2019 0044   KETONESUR NEGATIVE 04/13/2019 0044   PROTEINUR NEGATIVE 04/13/2019 0044   NITRITE NEGATIVE 04/13/2019 0044   LEUKOCYTESUR NEGATIVE 04/13/2019 0044   Sepsis Labs Invalid input(s): PROCALCITONIN,  WBC,  LACTICIDVEN Microbiology Recent Results (from the past 240 hour(s))  SARS CORONAVIRUS 2 (TAT 6-24 HRS) Nasopharyngeal Nasopharyngeal Swab     Status: None   Collection Time: 06/11/19  1:06 PM   Specimen: Nasopharyngeal Swab  Result Value Ref Range Status   SARS Coronavirus 2 NEGATIVE NEGATIVE Final     Comment: (NOTE) SARS-CoV-2 target nucleic acids are NOT DETECTED. The SARS-CoV-2 RNA is generally detectable in upper and lower respiratory specimens during the acute phase of infection. Negative results do not preclude SARS-CoV-2 infection, do not rule out co-infections with other pathogens, and should not be used as the sole basis for treatment or other patient management decisions. Negative results must be combined with clinical observations, patient history, and epidemiological information. The expected result is Negative. Fact Sheet for Patients: SugarRoll.be Fact Sheet for Healthcare Providers: https://www.woods-mathews.com/ This test is not yet approved or cleared by the [place] FDA and  has been authorized for detection and/or diagnosis of SARS-CoV-2 by FDA under an Emergency Use Authorization (EUA). This EUA will remain  in effect (meaning this test can be used) for the duration of the COVID-19 declaration under Section 56 4(b)(1) of the Act, 21 U.S.C. section 360bbb-3(b)(1), unless the authorization is terminated or revoked sooner. Performed at [facility], [place] [address] [place], [place] [address]     Please note: You were cared for by a hospitalist during your hospital stay. Once you are discharged, your primary care physician will handle any further medical issues. Please note that NO REFILLS for any discharge medications will be authorized once you are discharged, as it is imperative that you return to your primary care physician (or establish a relationship with a primary care physician if you do not have one) for your post hospital discharge needs so that they can reassess your need for medications and monitor your lab values.    Time coordinating discharge: 40 minutes  SIGNED:   Shelly Coss, MD  [facility] 06/13/2019, 11:03 AM Pager [phone]  If 7PM-7AM, please contact  night-coverage [email or web address] Password [email or web address]

## 2019-06-13 NOTE — Telephone Encounter (Signed)
CSW following patient through outpatient [facility] program.  Met with pt at bedside to check in and discuss discharge plans.  Pt reports he is feeling better at this time but is concerned for how he will feel in a we  Pt confirms he is supposed to DC from hospital today.  CSW updated community paramedic who will do home visit with patient this afternoon to review medications and fill pill box.  CSW also updated Randall Hiss with [employer] who is managing patients new CPAP- he will schedule post hospital follow up with patient.  CSW will continue to follow and assist as needed  [name], [facility] Desk#: [phone] Cell#: [phone]

## 2019-06-14 ENCOUNTER — Telehealth (HOSPITAL_COMMUNITY): Payer: Self-pay | Admitting: *Deleted

## 2019-06-14 ENCOUNTER — Telehealth (HOSPITAL_COMMUNITY): Payer: Self-pay | Admitting: Licensed Clinical Social Worker

## 2019-06-14 NOTE — Telephone Encounter (Addendum)
CSW consulted by [employer] to help with getting follow up with [facility] despite pt outstanding balance.  CSW noticed that pt payor source in chart now saying medicaid- reviewed financial counseling notes and confirmed that Medicaid was added on 9/24  CSW called and spoke with [facility] and provided with Medicaid number- they will try to bill Medicaid to backpay outstanding balance.  Able to schedule appt for 10/2 at 9:10am for $3 copay- CSW informed pt and he is able to attend and will plan to bring the copay for appt- CSW to provide with taxi to get to appt.  [facility] at [facility] also requested list of appts so they can sign pt in and out- CSW sent requested information  Pt also inquired about getting portable oxygen tank now that he has insurance.  CSW called pt O2 provider, [employer], who state that now that pt has insurance they have to approve him getting oxygen so they would need new orders/face to face to start billing through Medicaid.  CSW called pt PCP office to request they complete this during his next clinic visit on 10/9- they have added to appt notes.  CSW will continue to follow and assist as needed  [name], [facility] Desk#: [phone] Cell#: [phone]

## 2019-06-14 NOTE — Telephone Encounter (Signed)
Previous goals: LONG TERM:  - Weight loss (300 lbs)  - "Get my breath back"  - "Get my life back, get on my own again"  - "Chill out"   SHORT TERM: Last Week: - Lose weight and improve breathing (330 lbs)  - He wants to walk around the block in 2 months (November). - Every day walk ramp 1x daily.  - Document how many times walking up and down stairs.  - Continue with diet improvement-receiving guidance elsewhere in this area - Weigh every day  - No sodas allowed in living facility-do better than next week (no relapses)   Progress Note: Patient has had another readmission for bleeding and has not been home. He was unable to walk every day due to this bleeding event and hospitalization. He did walk on Saturday and walked 3 blocks instead of the ramp. Patient stated he was tired after walking and was maybe over compensating for missed previous days of activity completion. He was disappointed he was unable to try to complete. Currently on the phone during this session and loading boxes of food. He feels better. Mr. Mastrangelo is wanting to walk the stairs today instead of going outside because of the rain.   Achievements: Walked 3 blocks outside on one day, he felt accomplished.   Challenges/Frustrations:  He cannot stay on schedule due to the bleeding and then he feels "sad". He has surgery planned to repair a tear that is causing the bleeding. He wants to walk a mile when he has surgery and recovers.    New Weekly Goal (s): Walk the ramp once a day in the next week to limit overexertion and to manage the more acute problems with the GI bleed. Patient will ambulate in the living facility as he feels, given blood loss. He will try to not overachieve on single days in attempt to make up for any missed day.    Mr. Vannice wants to [facility] before making in further changes to increase and improve his physical activity. He is weak and trying to manage his GI bleed and surgery he  mentioned next week. Given this status and other possible complications currently, patient was advised by CE[facility] to limit the overexertion and over-compensation with increased walks on single days to make up for the missed days. Patient established a small and realistically achievable goal for him depending on his weakness level, to be self-evaluated each day. Patient would like to continue with Exercise follow-up weekly, in attempt and with hopes of resolving his bleeding issue and getting back on track and on schedule. CEP will follow, re-evaluate and report next week.    Landis Martins, MS, ACSM-RCEP Clinical Exercise Physiologist

## 2019-06-17 ENCOUNTER — Other Ambulatory Visit (HOSPITAL_COMMUNITY): Payer: Self-pay

## 2019-06-17 ENCOUNTER — Telehealth (HOSPITAL_COMMUNITY): Payer: Self-pay | Admitting: Licensed Clinical Social Worker

## 2019-06-17 MED FILL — HYDROCORTISONE AC 25 MG SUP: 25 | 6 days supply | Qty: 12 | Fill #1

## 2019-06-17 MED FILL — ?OMEPRAZOLE 20MG CAP DR: 20 | 30 days supply | Qty: 30 | Fill #1

## 2019-06-17 NOTE — Progress Notes (Addendum)
Paramedicine Encounter    Patient ID: Benjamin [name], male    DOB: 1966/06/15, 53 y.o.   MRN: [id number]   Patient Care Team: [name], NP as PCP - General (Nurse Practitioner) [name], MD as PCP - Cardiology (Cardiology) [name], LCSW as Social Worker (Licensed Clinical Social Worker)  Patient Active Problem List   Diagnosis Date Noted  . BRBPR (bright red blood per rectum) 06/12/2019  . Hypertension   . CHF (congestive heart failure) ([place])   . Sleep apnea   . Chronic respiratory failure ([place])   . Lower GI bleeding 06/11/2019  . Hypokalemia 05/29/2019  . GI bleed 05/29/2019  . Anemia 05/28/2019  . Acute on chronic diastolic CHF (congestive heart failure) ([place]) 04/11/2019  . Iron deficiency anemia 03/11/2019  . OSA on CPAP 03/11/2019  . HLD (hyperlipidemia) 03/11/2019  . Elevated troponin 03/11/2019  . GERD (gastroesophageal reflux disease) 03/11/2019  . Rectal bleeding 02/20/2019  . Dyspnea 02/06/2019  . COVID-19 virus infection 02/06/2019  . Bilateral lower extremity edema   . Morbid obesity with BMI of 50.0-59.9, adult ([place])   . Chronic diastolic CHF (congestive heart failure) ([place])   . PAF (paroxysmal atrial fibrillation) ([place])   . PE (pulmonary thromboembolism) ([place]) 01/21/2019  . Hypertensive urgency 01/21/2019  . Diabetes mellitus type 2 in obese ([place]) 01/21/2019    Current Outpatient Medications:  .  albuterol (VENTOLIN HFA) 108 (90 Base) MCG/ACT inhaler, Inhale 2 puffs into the lungs every 6 (six) hours as needed for wheezing or shortness of breath. , Disp: , Rfl:  .  amLODipine (NORVASC) 10 MG tablet, Take 1 tablet (10 mg total) by mouth daily., Disp: 90 tablet, Rfl: 1 .  atorvastatin (LIPITOR) 20 MG tablet, Take 1 tablet (20 mg total) by mouth at bedtime., Disp: 90 tablet, Rfl: 2 .  Blood Glucose Monitoring Suppl (TRUE METRIX METER) w/Device KIT, Use as instructed. Check blood glucose level by fingerstick twice per day., Disp: 1 kit, Rfl:  0 .  carboxymethylcellulose (REFRESH PLUS) 0.5 % SOLN, Place 1 drop into both eyes daily. , Disp: , Rfl:  .  docusate sodium (COLACE) 100 MG capsule, Take 1 capsule (100 mg total) by mouth 2 (two) times daily., Disp: 60 capsule, Rfl: 2 .  glucose blood (TRUE METRIX BLOOD GLUCOSE TEST) test strip, Use as instructed. Check blood glucose level by fingerstick twice per day., Disp: 100 each, Rfl: 12 .  hydrocortisone (ANUSOL-HC) 25 MG suppository, Place 1 suppository (25 mg total) rectally 2 (two) times daily., Disp: 36 suppository, Rfl: 1 .  labetalol (NORMODYNE) 200 MG tablet, Take 1 tablet (200 mg total) by mouth 2 (two) times daily., Disp: 120 tablet, Rfl: 0 .  levalbuterol (XOPENEX HFA) 45 MCG/ACT inhaler, Inhale 2 puffs into the lungs every 4 (four) hours as needed for wheezing. , Disp: , Rfl:  .  methocarbamol (ROBAXIN) 500 MG tablet, Take 1 tablet (500 mg total) by mouth 3 (three) times daily., Disp: 60 tablet, Rfl: 1 .  omeprazole (PRILOSEC) 20 MG capsule, Take 1 capsule (20 mg total) by mouth daily., Disp: 30 capsule, Rfl: 3 .  polyethylene glycol (MIRALAX / GLYCOLAX) 17 g packet, Take 17 g by mouth daily as needed for severe constipation., Disp: 14 each, Rfl: 0 .  potassium chloride SA (K-DUR) 20 MEQ tablet, Take 2 tablets (40 mEq total) by mouth 2 (two) times daily., Disp: 360 tablet, Rfl: 3 .  rivaroxaban (XARELTO) 20 MG TABS tablet, Take 1 tablet (  20 mg total) by mouth daily with supper., Disp: 60 tablet, Rfl: 0 .  senna-docusate (SENOKOT-S) 8.6-50 MG tablet, Take 2 tablets by mouth 2 (two) times daily., Disp: 60 tablet, Rfl: 3 .  torsemide (DEMADEX) 20 MG tablet, Take 4 tablets (80 mg total) by mouth 2 (two) times daily., Disp: 240 tablet, Rfl: 6 .  TRUEplus Lancets 28G MISC, Use as instructed. Check blood glucose level by fingerstick twice per day., Disp: 200 each, Rfl: 3 .  ferrous sulfate 325 (65 FE) MG tablet, Take 1 tablet (325 mg total) by mouth daily. Patient will pick up scripts  today., Disp: 90 tablet, Rfl: 0 .  metFORMIN (GLUCOPHAGE) 500 MG tablet, Take 1 tablet (500 mg total) by mouth 2 (two) times a day. Patient will pick up scripts today. (Patient taking differently: Take 500 mg by mouth 2 (two) times daily with a meal. Patient will pick up scripts today.), Disp: 180 tablet, Rfl: 0 No Known Allergies    Social History   Socioeconomic History  . Marital status: Married    Spouse name: Not on file  . Number of children: Not on file  . Years of education: Not on file  . Highest education level: Not on file  Occupational History  . Not on file  Social Needs  . Financial resource strain: Not on file  . Food insecurity    Worry: Not on file    Inability: Not on file  . Transportation needs    Medical: Not on file    Non-medical: Not on file  Tobacco Use  . Smoking status: Former Research scientist (life sciences)  . Smokeless tobacco: Never Used  Substance and Sexual Activity  . Alcohol use: Not Currently  . Drug use: Not Currently  . Sexual activity: Not Currently  Lifestyle  . Physical activity    Days per week: Not on file    Minutes per session: Not on file  . Stress: Not on file  Relationships  . Social Herbalist on phone: Not on file    Gets together: Not on file    Attends religious service: Not on file    Active member of club or organization: Not on file    Attends meetings of clubs or organizations: Not on file    Relationship status: Not on file  . Intimate partner violence    Fear of current or ex partner: Not on file    Emotionally abused: Not on file    Physically abused: Not on file    Forced sexual activity: Not on file  Other Topics Concern  . Not on file  Social History Narrative  . Not on file    Physical Exam      Future Appointments  Date Time Provider [place]  06/28/2019  1:30 PM [name], NP [facility] None  07/04/2019 11:45 AM [name], MD [facility] None  08/05/2019 11:30 AM [facility] PA/NP [facility]  None    BP (!) 180/104   Pulse 90   Temp 98 F (36.7 C)   Resp 20   SpO2 96%   Weight yesterday-399 Last visit weight-401  Pt reports he spilled his meds from his pill box and he took like 8 pills of torsemide as he tried to fix it back but misfilled it.  He states he has been up all night urinating.  He has been using the donut cushion and he states no more rectal bleeding.  He denies any c/p, no dizziness, no  sob, using CPAP every night.  meds verified and pill box refilled.  Pt is now approved for medicaid and is now able to be seen at the GI office for his rectal bleeding.  [name] was able to make his appointment for Friday and she is assisting with transportation for it.  His disability worker called and he is able to get $30 monthly now and once he moves out he will have to let them know and provide documents and see how much he can get a month.  He forgot to weigh this morning.  He needs to apply for food stamps as well once he moves out.   He called his probation officer and he said to call a mrs bell to get more advice.  He needs his suppositories refilled and his omeprazole so that was done.  Spoke to jasmine at CHF clinic and she advised he could continue taking his normal routine meds.   [name], [facility] Paramedic  06/17/19

## 2019-06-17 NOTE — Telephone Encounter (Addendum)
CSW informed by community paramedicine that patient has been informed he is approved for SSI benefits at this time.  Per rep he will only get $30/month while he is staying in his current location as all his living expenses are being handled at this time but once he is able to move to a different location he can get $783/month from [place].  Pt is interested in moving out of his current living situation ASAP and is agreeable to [facility] [place] speaking with his parole officer (Mindi Slicker [phone]) as well as [employer] about finding housing- he will have a complicated transition as he will not have a source of income to pay for deposit and first months rent and he has a felony record.  CSW sent referral to [employer] via [place] and spoke with pt probation officer- he will speak with others in his department regarding potential resources as well as the case worker at the halfway house to see what options the pt may have to transition out of his current living situation- his stay will be up in December.  CSW also spoke to pt about getting his own phone through government benefits and he is agreeable.  CSW completed application over the phone with patient for [employer] phone- awaiting determination and further details.  CSW will continue to follow and assist as needed  [name], [facility] Desk#: [phone] Cell#: [phone]

## 2019-06-19 ENCOUNTER — Telehealth (HOSPITAL_COMMUNITY): Payer: Self-pay | Admitting: Licensed Clinical Social Worker

## 2019-06-19 ENCOUNTER — Telehealth: Payer: Self-pay | Admitting: Nurse Practitioner

## 2019-06-19 ENCOUNTER — Other Ambulatory Visit (HOSPITAL_COMMUNITY): Payer: Self-pay

## 2019-06-19 NOTE — Telephone Encounter (Signed)
Community paramedic called CSW to see if we could transfer pt medications to a pharmacy that is able to waive copays- she spoke with [facility] pharmacy and even though pt now has Medicaid coverage they would still require some payment for his medications which he does not currently have access to.  CSW called [facility] who is willing to waive copays and they are contacting [facility] to request a transfer.  CSW will continue to follow and assist as needed  [name], [facility] Desk#: [phone] Cell#: [phone]

## 2019-06-19 NOTE — Progress Notes (Signed)
Pt called me stating the pharmacy has meds have ready for him.  He also reports he is on his last tank of 02.  jenna called [employer] [facility] and I am able to swap out his tanks for him as well.  So I went and picked up his empty 02 tanks and took to [employer] [facility] for swap out. I then went to [place] [facility] and gave them his medicaid ID number with permission of pt-his med co-pay will still be placed on his account there.  I picked up his suppository and omeprazole and took that and his 02 to him.  Will see him again on Monday.   [name], EMT-Paramedic  06/19/19

## 2019-06-20 ENCOUNTER — Telehealth (HOSPITAL_COMMUNITY): Payer: Self-pay | Admitting: Licensed Clinical Social Worker

## 2019-06-20 NOTE — Telephone Encounter (Signed)
CSW continuing to work on finding housing for patient when he has to leave the halfway house in December.  CSW spoke with [employer]: they directed CSW to [employer] to apply for public housing- unable to apply at this time as the waitlist is closed.  Also directed to [employer] to find housing within his price range- CSW attempted calling several options with no return call at this time.  Hiram emailed [employer] regarding bed availability at one of their housing communities- no bed availability at this time and [employer] [employer] which patient is not  CSW left message with [employer] which is transitional housing option to see if any availability  CSW spoke with parole officer who has also been unable to find housing options at this time.  CSW left message with pt Medicaid caseworker to see if she could direct me to any resources within DSS  CSW will continue to follow and assist as needed  [name], [place] Worker [facility] Desk#: [phone] Cell#: [phone]

## 2019-06-21 ENCOUNTER — Telehealth (HOSPITAL_COMMUNITY): Payer: Self-pay | Admitting: Licensed Clinical Social Worker

## 2019-06-21 ENCOUNTER — Telehealth (HOSPITAL_COMMUNITY): Payer: Self-pay | Admitting: *Deleted

## 2019-06-21 DIAGNOSIS — I8290 Acute embolism and thrombosis of unspecified vein: Secondary | ICD-10-CM | POA: Diagnosis not present

## 2019-06-21 DIAGNOSIS — K642 Third degree hemorrhoids: Secondary | ICD-10-CM | POA: Diagnosis not present

## 2019-06-21 DIAGNOSIS — E662 Morbid (severe) obesity with alveolar hypoventilation: Secondary | ICD-10-CM | POA: Diagnosis not present

## 2019-06-21 NOTE — Telephone Encounter (Signed)
Previous goals: LONG TERM:  - Weight loss (300 lbs)  - "Get my breath back"  - "Get my life back, get on my own again"  - "Chill out"   SHORT TERM: Last Week: - Walk the ramp once a day in the next week to limit overexertion and to manage the more acute problems with the GI bleed. Patient will ambulate in the living facility as he feels, given blood loss. He will try to not overachieve on single days in attempt to make up for any missed day.  Progress Note: Patient is tired as he had a procedure this morning to repair and stop hemorrhoids bleeding. He has been consistent and complied with the plan in the last week and he felt as though it was a good call to make a conservative plan in one ramp walk a day. He had a frustrated day in the last week regarding his living status once he is displaced from his living facility. He stated he took an extra walk that day to cope with the frustration. He says the walk helped him to calm down. He felt as though he was able to pull his thoughts together during the walk. [name] has been instructed by his physician to take it easy and avoid strenuous activity to avoid relapse and delay in healing from procedure.    Achievements: - He walked the ramp 1x for 4days and he felt good and accomplished and felt as though it was very feasible. He is still walking up and down the stairs 3x a day due to daily activities for sleeping and using the restroom. He took a relaxation walk to calm down and felt great success in this.   Challenges/Frustrations: - Arrangements for when he is displaced from the living facility - The bleeding and the process of bouncing back from this surgery and repair - He wants to feel better and exercise to make bigger strides in your goal    New Weekly Goal (s): - To rest and take it easy, will re-evaluate next week. No walking as he has been instructed by his physician to rest and to avoid strenuous activity until follow-up on Monday.     [name] expressed frustrations, celebrations, and realistic goals in this session. He is currently in a holding pattern due to his status of hemorrhoids and the recent procedure. This is a critical time for him to follow and comply with his orders to prevent further surgical intervention. He will comply with resting and being mindful in his diet to avoid any other digestive issues and further complications. He will meet with his surgeon on Monday and CEP will follow- up next week in regards to any specific limitations set by his surgeon. [name] and CEP re-evaluate in session next week.      Benjamin Martins, MS, ACSM-RCEP Clinical Exercise Physiologist

## 2019-06-21 NOTE — Telephone Encounter (Signed)
Pt requiring help with transportation to [facility] appt this morning- taxi arranged to take patient to appt.  CSW will continue to follow and assist as needed  [name], [facility] Desk#: [phone] Cell#: [phone]

## 2019-06-24 ENCOUNTER — Telehealth (HOSPITAL_COMMUNITY): Payer: Self-pay

## 2019-06-24 ENCOUNTER — Other Ambulatory Visit (HOSPITAL_COMMUNITY): Payer: Self-pay

## 2019-06-24 ENCOUNTER — Other Ambulatory Visit: Payer: Self-pay | Admitting: Nurse Practitioner

## 2019-06-24 ENCOUNTER — Telehealth: Payer: Self-pay

## 2019-06-24 ENCOUNTER — Telehealth (HOSPITAL_COMMUNITY): Payer: Self-pay | Admitting: Licensed Clinical Social Worker

## 2019-06-24 MED ORDER — FERROUS SULFATE 325 (65 FE) MG PO TABS: 325.0000 mg | ORAL_TABLET | Freq: Every day | ORAL | 0 refills | Status: DC

## 2019-06-24 MED ORDER — POLYETHYLENE GLYCOL 3350 17 G PO PACK: 17.0000 g | PACK | Freq: Every day | ORAL | 3 refills | Status: AC | PRN

## 2019-06-24 MED ORDER — ALBUTEROL SULFATE HFA 108 (90 BASE) MCG/ACT IN AERS: 2.0000 | INHALATION_SPRAY | Freq: Four times a day (QID) | RESPIRATORY_TRACT | 0 refills | Status: DC | PRN

## 2019-06-24 MED FILL — FERROUS SULFATE 325 MG TAB: 325 (65 FE) | 30 days supply | Qty: 30 | Fill #2

## 2019-06-24 NOTE — Progress Notes (Signed)
Paramedicine Encounter    Patient ID: Benjamin [name], male    DOB: Apr 18, 1966, 53 y.o.   MRN: [id number]   Patient Care Team: [name], NP as PCP - General (Nurse Practitioner) [name], MD as PCP - Cardiology (Cardiology) [name], LCSW as Social Worker (Licensed Clinical Social Worker)  Patient Active Problem List   Diagnosis Date Noted  . BRBPR (bright red blood per rectum) 06/12/2019  . Hypertension   . CHF (congestive heart failure) ([place])   . Sleep apnea   . Chronic respiratory failure ([place])   . Lower GI bleeding 06/11/2019  . Hypokalemia 05/29/2019  . GI bleed 05/29/2019  . Anemia 05/28/2019  . Acute on chronic diastolic CHF (congestive heart failure) ([place]) 04/11/2019  . Iron deficiency anemia 03/11/2019  . OSA on CPAP 03/11/2019  . HLD (hyperlipidemia) 03/11/2019  . Elevated troponin 03/11/2019  . GERD (gastroesophageal reflux disease) 03/11/2019  . Rectal bleeding 02/20/2019  . Dyspnea 02/06/2019  . COVID-19 virus infection 02/06/2019  . Bilateral lower extremity edema   . Morbid obesity with BMI of 50.0-59.9, adult ([place])   . Chronic diastolic CHF (congestive heart failure) ([place])   . PAF (paroxysmal atrial fibrillation) ([place])   . PE (pulmonary thromboembolism) ([place]) 01/21/2019  . Hypertensive urgency 01/21/2019  . Diabetes mellitus type 2 in obese ([place]) 01/21/2019    Current Outpatient Medications:  .  albuterol (VENTOLIN HFA) 108 (90 Base) MCG/ACT inhaler, Inhale 2 puffs into the lungs every 6 (six) hours as needed for wheezing or shortness of breath. , Disp: , Rfl:  .  amLODipine (NORVASC) 10 MG tablet, Take 1 tablet (10 mg total) by mouth daily., Disp: 90 tablet, Rfl: 1 .  atorvastatin (LIPITOR) 20 MG tablet, Take 1 tablet (20 mg total) by mouth at bedtime., Disp: 90 tablet, Rfl: 2 .  Blood Glucose Monitoring Suppl (TRUE METRIX METER) w/Device KIT, Use as instructed. Check blood glucose level by fingerstick twice per day., Disp: 1 kit, Rfl:  0 .  carboxymethylcellulose (REFRESH PLUS) 0.5 % SOLN, Place 1 drop into both eyes daily. , Disp: , Rfl:  .  docusate sodium (COLACE) 100 MG capsule, Take 1 capsule (100 mg total) by mouth 2 (two) times daily., Disp: 60 capsule, Rfl: 2 .  ferrous sulfate 325 (65 FE) MG tablet, Take 1 tablet (325 mg total) by mouth daily. Patient will pick up scripts today., Disp: 90 tablet, Rfl: 0 .  glucose blood (TRUE METRIX BLOOD GLUCOSE TEST) test strip, Use as instructed. Check blood glucose level by fingerstick twice per day., Disp: 100 each, Rfl: 12 .  hydrocortisone (ANUSOL-HC) 25 MG suppository, Place 1 suppository (25 mg total) rectally 2 (two) times daily., Disp: 36 suppository, Rfl: 1 .  labetalol (NORMODYNE) 200 MG tablet, Take 1 tablet (200 mg total) by mouth 2 (two) times daily., Disp: 120 tablet, Rfl: 0 .  levalbuterol (XOPENEX HFA) 45 MCG/ACT inhaler, Inhale 2 puffs into the lungs every 4 (four) hours as needed for wheezing. , Disp: , Rfl:  .  metFORMIN (GLUCOPHAGE) 500 MG tablet, Take 1 tablet (500 mg total) by mouth 2 (two) times a day. Patient will pick up scripts today. (Patient taking differently: Take 500 mg by mouth 2 (two) times daily with a meal. Patient will pick up scripts today.), Disp: 180 tablet, Rfl: 0 .  omeprazole (PRILOSEC) 20 MG capsule, Take 1 capsule (20 mg total) by mouth daily., Disp: 30 capsule, Rfl: 3 .  polyethylene glycol (MIRALAX /  GLYCOLAX) 17 g packet, Take 17 g by mouth daily as needed for severe constipation., Disp: 14 each, Rfl: 0 .  potassium chloride SA (K-DUR) 20 MEQ tablet, Take 2 tablets (40 mEq total) by mouth 2 (two) times daily., Disp: 360 tablet, Rfl: 3 .  rivaroxaban (XARELTO) 20 MG TABS tablet, Take 1 tablet (20 mg total) by mouth daily with supper., Disp: 60 tablet, Rfl: 0 .  senna-docusate (SENOKOT-S) 8.6-50 MG tablet, Take 2 tablets by mouth 2 (two) times daily., Disp: 60 tablet, Rfl: 3 .  torsemide (DEMADEX) 20 MG tablet, Take 4 tablets (80 mg total) by  mouth 2 (two) times daily., Disp: 240 tablet, Rfl: 6 .  TRUEplus Lancets 28G MISC, Use as instructed. Check blood glucose level by fingerstick twice per day., Disp: 200 each, Rfl: 3 .  methocarbamol (ROBAXIN) 500 MG tablet, Take 1 tablet (500 mg total) by mouth 3 (three) times daily. (Patient not taking: Reported on 06/24/2019), Disp: 60 tablet, Rfl: 1 No Known Allergies   Social History   Socioeconomic History  . Marital status: Married    Spouse name: Not on file  . Number of children: Not on file  . Years of education: Not on file  . Highest education level: Not on file  Occupational History  . Not on file  Social Needs  . Financial resource strain: Not on file  . Food insecurity    Worry: Not on file    Inability: Not on file  . Transportation needs    Medical: Not on file    Non-medical: Not on file  Tobacco Use  . Smoking status: Former Research scientist (life sciences)  . Smokeless tobacco: Never Used  Substance and Sexual Activity  . Alcohol use: Not Currently  . Drug use: Not Currently  . Sexual activity: Not Currently  Lifestyle  . Physical activity    Days per week: Not on file    Minutes per session: Not on file  . Stress: Not on file  Relationships  . Social Herbalist on phone: Not on file    Gets together: Not on file    Attends religious service: Not on file    Active member of club or organization: Not on file    Attends meetings of clubs or organizations: Not on file    Relationship status: Not on file  . Intimate partner violence    Fear of current or ex partner: Not on file    Emotionally abused: Not on file    Physically abused: Not on file    Forced sexual activity: Not on file  Other Topics Concern  . Not on file  Social History Narrative  . Not on file    Physical Exam Vitals signs reviewed.  Constitutional:      Appearance: He is obese.  HENT:     Head: Normocephalic.     Mouth/Throat:     Mouth: Mucous membranes are moist.  Eyes:     Pupils:  Pupils are equal, round, and reactive to light.  Neck:     Musculoskeletal: Normal range of motion and neck supple.  Cardiovascular:     Rate and Rhythm: Normal rate and regular rhythm.     Pulses: Normal pulses.     Heart sounds: Normal heart sounds.  Pulmonary:     Effort: Pulmonary effort is normal.     Breath sounds: Normal breath sounds.  Abdominal:     General: There is distension.  Musculoskeletal:  General: Swelling present.     Right lower leg: Edema present.     Left lower leg: Edema present.     Comments: Leg left edema worse than right leg.   Skin:    General: Skin is warm and dry.     Capillary Refill: Capillary refill takes less than 2 seconds.  Neurological:     Mental Status: Mental status is at baseline.  Psychiatric:        Mood and Affect: Mood normal.    Arrived for visit with Mr. Littler who reports feeling "good" today. [name] reports having a hemmrhoid procedure last week and wanted me to call the office to verifiy if he is allowed to eat solid foods. He stated he has only been eating soft foods. Call was made and nurse advised patient has no dietary restrictions and encouraged follow up with surgery center on October 20th.[name] reports taking his medication and pill box noted to be empty. Medications were reviewed and verified. Pill box filled accordingly. Patient's vitals obtained and recorded. Patient reports his weight at 399lbs. Patient has a nasal cannula with portable O2 in which he is to use but did not have it on during assessment. Patient noted to not have shortness of breath. Patient reports feeling fine without it but says he uses it when he walks. Room air sat 96%. Edema noted to both legs but more so on the left. Patient reports having some salt in his diet over the weekend. Patient educated on low sodium diets and the need for same to help with swelling. Patient understood. Patient requesting refills for inhalers, Miralax and Ferrous Sulfate. All  requested via phone to [facility] pharmacy. Patient advised Joellen Jersey would follow up next week visit.   CBG- 180 Weight- 399lbs   MEDS ORDERED: -Ferrous Sulfate -Inhalers -Miralax      Future Appointments  Date Time Provider [place]  06/28/2019  1:30 PM [name], NP [facility] None  07/04/2019 11:45 AM [name], MD [facility] None  08/05/2019 11:30 AM [facility] PA/NP [facility] None     ACTION: Home visit completed

## 2019-06-24 NOTE — Telephone Encounter (Signed)
Xopenex was only given in the hospital. I have refilled his other medications.

## 2019-06-24 NOTE — Telephone Encounter (Signed)
Spoke to [employer] who confirmed appointment for this morning. Call completed.

## 2019-06-24 NOTE — Telephone Encounter (Signed)
CSW continuing to search for housing options for patient.  Identified tax credit apartment opportunity- they have 1 bedroom option available but have to complete some maintenance before its ready for move in- will call CSW to set up application time with hopes that patient will be a candidate.  CSW continuing to pursue other options.  [name], LCSW Clinical Social Worker [facility] Desk#: [phone] Cell#: [phone]

## 2019-06-24 NOTE — Telephone Encounter (Signed)
Benjamin [name], EMT called for refills for miralax, ferrous sulfate, ventolin and xopenex. She noted that there are no labels on inhalers to determine where they have been filled.   As per Katara Griner/RPH, [facility] does not have record of filling the inhalers. Prescriptions are needed.

## 2019-06-25 MED FILL — ALBUTEROL SULFATE HFA 108 (: 108 (90 BAS | 25 days supply | Qty: 9 | Fill #0

## 2019-06-25 NOTE — Telephone Encounter (Signed)
Message sent to [name], EMT regarding med refills.

## 2019-06-26 ENCOUNTER — Telehealth (HOSPITAL_COMMUNITY): Payer: Self-pay | Admitting: *Deleted

## 2019-06-26 ENCOUNTER — Encounter (HOSPITAL_COMMUNITY): Payer: Self-pay | Admitting: *Deleted

## 2019-06-26 ENCOUNTER — Encounter (HOSPITAL_COMMUNITY): Payer: Self-pay

## 2019-06-26 ENCOUNTER — Other Ambulatory Visit (HOSPITAL_COMMUNITY): Payer: Self-pay

## 2019-06-26 ENCOUNTER — Emergency Department (HOSPITAL_COMMUNITY)
Admission: EM | Admit: 2019-06-26 | Discharge: 2019-06-26 | Disposition: A | Discharge status: Home / Self Care | Payer: Medicaid Other | Attending: Emergency Medicine | Admitting: Emergency Medicine

## 2019-06-26 ENCOUNTER — Other Ambulatory Visit: Payer: Self-pay

## 2019-06-26 ENCOUNTER — Telehealth (HOSPITAL_COMMUNITY): Payer: Self-pay | Admitting: Licensed Clinical Social Worker

## 2019-06-26 DIAGNOSIS — Z79899 Other long term (current) drug therapy: Secondary | ICD-10-CM | POA: Insufficient documentation

## 2019-06-26 DIAGNOSIS — I509 Heart failure, unspecified: Secondary | ICD-10-CM | POA: Insufficient documentation

## 2019-06-26 DIAGNOSIS — Z7984 Long term (current) use of oral hypoglycemic drugs: Secondary | ICD-10-CM | POA: Insufficient documentation

## 2019-06-26 DIAGNOSIS — E119 Type 2 diabetes mellitus without complications: Secondary | ICD-10-CM | POA: Diagnosis not present

## 2019-06-26 DIAGNOSIS — I110 Hypertensive heart disease with heart failure: Secondary | ICD-10-CM | POA: Diagnosis not present

## 2019-06-26 DIAGNOSIS — Z7901 Long term (current) use of anticoagulants: Secondary | ICD-10-CM | POA: Diagnosis not present

## 2019-06-26 DIAGNOSIS — K649 Unspecified hemorrhoids: Secondary | ICD-10-CM

## 2019-06-26 DIAGNOSIS — R5381 Other malaise: Secondary | ICD-10-CM | POA: Diagnosis not present

## 2019-06-26 LAB — CBC
HCT: 27.7 % — ABNORMAL LOW (ref 39.0–52.0)
Hemoglobin: 8 g/dL — ABNORMAL LOW (ref 13.0–17.0)
MCH: 22.9 pg — ABNORMAL LOW (ref 26.0–34.0)
MCHC: 28.9 g/dL — ABNORMAL LOW (ref 30.0–36.0)
MCV: 79.4 fL — ABNORMAL LOW (ref 80.0–100.0)
Platelets: 398 10*3/uL (ref 150–400)
RBC: 3.49 MIL/uL — ABNORMAL LOW (ref 4.22–5.81)
RDW: 19.2 % — ABNORMAL HIGH (ref 11.5–15.5)
WBC: 9 10*3/uL (ref 4.0–10.5)
nRBC: 0 % (ref 0.0–0.2)

## 2019-06-26 LAB — COMPREHENSIVE METABOLIC PANEL
ALT: 15 U/L (ref 0–44)
AST: 16 U/L (ref 15–41)
Albumin: 3.4 g/dL — ABNORMAL LOW (ref 3.5–5.0)
Alkaline Phosphatase: 48 U/L (ref 38–126)
Anion gap: 9 (ref 5–15)
BUN: 11 mg/dL (ref 6–20)
CO2: 25 mmol/L (ref 22–32)
Calcium: 8.9 mg/dL (ref 8.9–10.3)
Chloride: 105 mmol/L (ref 98–111)
Creatinine, Ser: 1.15 mg/dL (ref 0.61–1.24)
GFR calc Af Amer: >60 mL/min (ref >60)
GFR calc non Af Amer: >60 mL/min (ref >60)
Glucose, Bld: 87 mg/dL (ref 70–99)
Potassium: 3.8 mmol/L (ref 3.5–5.1)
Sodium: 139 mmol/L (ref 135–145)
Total Bilirubin: 0.3 mg/dL (ref 0.3–1.2)
Total Protein: 7 g/dL (ref 6.5–8.1)

## 2019-06-26 LAB — TYPE AND SCREEN
ABO/RH(D): O POS
Antibody Screen: NEGATIVE

## 2019-06-26 NOTE — Progress Notes (Signed)
Paramedicine Encounter    Patient ID: Benjamin [name], male    DOB: 21-Aug-1966, 53 y.o.   MRN: [id number]   Patient Care Team: [name], NP as PCP - General (Nurse Practitioner) [name], MD as PCP - Cardiology (Cardiology) [name], LCSW as Social Worker (Licensed Clinical Social Worker)  Patient Active Problem List   Diagnosis Date Noted  . BRBPR (bright red blood per rectum) 06/12/2019  . Hypertension   . CHF (congestive heart failure) ([place])   . Sleep apnea   . Chronic respiratory failure ([place])   . Lower GI bleeding 06/11/2019  . Hypokalemia 05/29/2019  . GI bleed 05/29/2019  . Anemia 05/28/2019  . Acute on chronic diastolic CHF (congestive heart failure) ([place]) 04/11/2019  . Iron deficiency anemia 03/11/2019  . OSA on CPAP 03/11/2019  . HLD (hyperlipidemia) 03/11/2019  . Elevated troponin 03/11/2019  . GERD (gastroesophageal reflux disease) 03/11/2019  . Rectal bleeding 02/20/2019  . Dyspnea 02/06/2019  . COVID-19 virus infection 02/06/2019  . Bilateral lower extremity edema   . Morbid obesity with BMI of 50.0-59.9, adult ([place])   . Chronic diastolic CHF (congestive heart failure) ([place])   . PAF (paroxysmal atrial fibrillation) ([place])   . PE (pulmonary thromboembolism) ([place]) 01/21/2019  . Hypertensive urgency 01/21/2019  . Diabetes mellitus type 2 in obese ([place]) 01/21/2019    Current Outpatient Medications:  .  albuterol (VENTOLIN HFA) 108 (90 Base) MCG/ACT inhaler, Inhale 2 puffs into the lungs every 6 (six) hours as needed for wheezing or shortness of breath., Disp: 18 g, Rfl: 0 .  amLODipine (NORVASC) 10 MG tablet, Take 1 tablet (10 mg total) by mouth daily., Disp: 90 tablet, Rfl: 1 .  atorvastatin (LIPITOR) 20 MG tablet, Take 1 tablet (20 mg total) by mouth at bedtime., Disp: 90 tablet, Rfl: 2 .  Blood Glucose Monitoring Suppl (TRUE METRIX METER) w/Device KIT, Use as instructed. Check blood glucose level by fingerstick twice per day., Disp: 1 kit,  Rfl: 0 .  carboxymethylcellulose (REFRESH PLUS) 0.5 % SOLN, Place 1 drop into both eyes daily. , Disp: , Rfl:  .  docusate sodium (COLACE) 100 MG capsule, Take 1 capsule (100 mg total) by mouth 2 (two) times daily., Disp: 60 capsule, Rfl: 2 .  ferrous sulfate 325 (65 FE) MG tablet, Take 1 tablet (325 mg total) by mouth daily. Patient will pick up scripts today., Disp: 90 tablet, Rfl: 0 .  glucose blood (TRUE METRIX BLOOD GLUCOSE TEST) test strip, Use as instructed. Check blood glucose level by fingerstick twice per day., Disp: 100 each, Rfl: 12 .  hydrocortisone (ANUSOL-HC) 25 MG suppository, Place 1 suppository (25 mg total) rectally 2 (two) times daily., Disp: 36 suppository, Rfl: 1 .  labetalol (NORMODYNE) 200 MG tablet, Take 1 tablet (200 mg total) by mouth 2 (two) times daily., Disp: 120 tablet, Rfl: 0 .  omeprazole (PRILOSEC) 20 MG capsule, Take 1 capsule (20 mg total) by mouth daily., Disp: 30 capsule, Rfl: 3 .  polyethylene glycol (MIRALAX / GLYCOLAX) 17 g packet, Take 17 g by mouth daily as needed for severe constipation., Disp: 30 each, Rfl: 3 .  potassium chloride SA (K-DUR) 20 MEQ tablet, Take 2 tablets (40 mEq total) by mouth 2 (two) times daily., Disp: 360 tablet, Rfl: 3 .  rivaroxaban (XARELTO) 20 MG TABS tablet, Take 1 tablet (20 mg total) by mouth daily with supper., Disp: 60 tablet, Rfl: 0 .  torsemide (DEMADEX) 20 MG tablet, Take 4  tablets (80 mg total) by mouth 2 (two) times daily., Disp: 240 tablet, Rfl: 6 .  TRUEplus Lancets 28G MISC, Use as instructed. Check blood glucose level by fingerstick twice per day., Disp: 200 each, Rfl: 3 .  levalbuterol (XOPENEX HFA) 45 MCG/ACT inhaler, Inhale 2 puffs into the lungs every 4 (four) hours as needed for wheezing. , Disp: , Rfl:  .  metFORMIN (GLUCOPHAGE) 500 MG tablet, Take 1 tablet (500 mg total) by mouth 2 (two) times a day. Patient will pick up scripts today. (Patient taking differently: Take 500 mg by mouth 2 (two) times daily with a  meal. Patient will pick up scripts today.), Disp: 180 tablet, Rfl: 0 .  methocarbamol (ROBAXIN) 500 MG tablet, Take 1 tablet (500 mg total) by mouth 3 (three) times daily. (Patient not taking: Reported on 06/24/2019), Disp: 60 tablet, Rfl: 1 .  senna-docusate (SENOKOT-S) 8.6-50 MG tablet, Take 2 tablets by mouth 2 (two) times daily. (Patient not taking: Reported on 06/26/2019), Disp: 60 tablet, Rfl: 3 No Known Allergies    Social History   Socioeconomic History  . Marital status: Married    Spouse name: Not on file  . Number of children: Not on file  . Years of education: Not on file  . Highest education level: Not on file  Occupational History  . Not on file  Social Needs  . Financial resource strain: Not on file  . Food insecurity    Worry: Not on file    Inability: Not on file  . Transportation needs    Medical: Not on file    Non-medical: Not on file  Tobacco Use  . Smoking status: Former Research scientist (life sciences)  . Smokeless tobacco: Never Used  Substance and Sexual Activity  . Alcohol use: Not Currently  . Drug use: Not Currently  . Sexual activity: Not Currently  Lifestyle  . Physical activity    Days per week: Not on file    Minutes per session: Not on file  . Stress: Not on file  Relationships  . Social Herbalist on phone: Not on file    Gets together: Not on file    Attends religious service: Not on file    Active member of club or organization: Not on file    Attends meetings of clubs or organizations: Not on file    Relationship status: Not on file  . Intimate partner violence    Fear of current or ex partner: Not on file    Emotionally abused: Not on file    Physically abused: Not on file    Forced sexual activity: Not on file  Other Topics Concern  . Not on file  Social History Narrative  . Not on file    Physical Exam      Future Appointments  Date Time Provider [place]  06/28/2019  1:30 PM [name], NP [facility] None  07/04/2019  11:45 AM [name], MD [facility] None  08/05/2019 11:30 AM [facility] PA/NP [facility] None    BP (!) 184/0   Pulse 84   Temp 97.8 F (36.6 C)   Resp (!) 22   SpO2 97%   Weight yesterday-401 Last visit weight-399  Pt called me today stating he is not feeling well, he is still having rectal bleeding.  He went to [facility] [place] [facility] on 10/2 and he reports the providers there placed a band on his hemorrhoids.  He ran out of his miralax the other day  and since then it has become very hard to pass a BM easily so therefore he has been straining. He had bleeding again today and has felt very weak afterwards.  He just looks like he feels bad. His eyes are darkened. He also reports pain to his rectal area.  He was not able to take his meds this morning b/c his pill box spilled out and he wasn't sure what to take.  It was decided he go back to the ER for further eval. I called for EMS unit to come and take him to hosp.  He is to f/u with that [facility] [place] [facility] in 3 wks.   [name], [facility] Paramedic  06/26/19

## 2019-06-26 NOTE — Discharge Instructions (Signed)
Take 1 capful of miralax 2 or 3 times daily for soft stools (almost diarrhea). Take fiber supplement and drink with a good amount of water daily. Take colace 100mg  twice daily.  Follow up with GI and your primary care doctor. Return to ER if bleeding worsens.

## 2019-06-26 NOTE — Telephone Encounter (Signed)
Patient did not feel well today and is currently waiting for Katie, [employer], to arrive for some evaluation. Per other acute issues, today's appointment for Exercise and wellness coaching will follow-up next week, per CEP. After a few questions prior to our scheduled session, patient presented with contraindications for further exercise guidance today.    Benjamin Martins, MS, ACSM-RCEP Clinical Exercise Physiologist

## 2019-06-26 NOTE — ED Triage Notes (Signed)
Patient arrived by [facility]. Patient had hemorrhoid surgery recently and hemorrhoid was banded. Today increased rectal bleeding and thinks due to constipation or bands off from hemorrhoid. Patient on oxygen at 2l all the time. Complains of increased weakness and losing lots of blood x 2 days

## 2019-06-26 NOTE — ED Provider Notes (Signed)
[facility] EMERGENCY DEPARTMENT Provider Note   CSN: [id number] Arrival date & time: 06/26/19  1638     History   Chief Complaint No chief complaint on file.   HPI Benjamin [name] is a 53 y.o. male.     53yo M w/ PMH including CHF, A fib on anticoagulation, PE, T2DM, OSA, chronic respiratory failure on 2L who p/w rectal bleeding. Pt was recently admitted to the hospital for rectal bleeding and had a scope by GI.  He was noted to have hemorrhoids and received banding.  He has been taking MiraLAX once daily and colace once daily. Today, he strained to have a hard bowel movement and then began having bright red blood again similar to his previous experience.  He states that when he was discharged, his stools were soft and he was not having bleeding problems.  He denies any abdominal pain.  No fevers or vomiting.  The history is provided by the patient.    Past Medical History:  Diagnosis Date  . A-fib ([place])   . Anemia 05/29/2019  . CHF (congestive heart failure) ([place])   . Chronic respiratory failure ([place])   . Diabetes mellitus without complication ([place])   . Elevated troponin 02/06/2019  . Hypertension   . Obesity   . Pulmonary embolism ([place])   . Rectal bleeding 05/29/2019  . Sleep apnea     Patient Active Problem List   Diagnosis Date Noted  . BRBPR (bright red blood per rectum) 06/12/2019  . Hypertension   . CHF (congestive heart failure) ([place])   . Sleep apnea   . Chronic respiratory failure ([place])   . Lower GI bleeding 06/11/2019  . Hypokalemia 05/29/2019  . GI bleed 05/29/2019  . Anemia 05/28/2019  . Acute on chronic diastolic CHF (congestive heart failure) ([place]) 04/11/2019  . Iron deficiency anemia 03/11/2019  . OSA on CPAP 03/11/2019  . HLD (hyperlipidemia) 03/11/2019  . Elevated troponin 03/11/2019  . GERD (gastroesophageal reflux disease) 03/11/2019  . Rectal bleeding 02/20/2019  . Dyspnea 02/06/2019  . COVID-19 virus infection  02/06/2019  . Bilateral lower extremity edema   . Morbid obesity with BMI of 50.0-59.9, adult ([place])   . Chronic diastolic CHF (congestive heart failure) ([place])   . PAF (paroxysmal atrial fibrillation) ([place])   . PE (pulmonary thromboembolism) ([place]) 01/21/2019  . Hypertensive urgency 01/21/2019  . Diabetes mellitus type 2 in obese ([place]) 01/21/2019    Past Surgical History:  Procedure Laterality Date  . COLONOSCOPY WITH PROPOFOL Left 02/25/2019   Procedure: COLONOSCOPY WITH PROPOFOL;  Surgeon: Arta Silence, MD;  Location: [facility];  Service: Endoscopy;  Laterality: Left;  . ESOPHAGOGASTRODUODENOSCOPY (EGD) WITH PROPOFOL Left 02/25/2019   Procedure: ESOPHAGOGASTRODUODENOSCOPY (EGD) WITH PROPOFOL;  Surgeon: Arta Silence, MD;  Location: [facility] ENDOSCOPY;  Service: Endoscopy;  Laterality: Left;  . ESOPHAGOGASTRODUODENOSCOPY (EGD) WITH PROPOFOL N/A 06/12/2019   Procedure: ESOPHAGOGASTRODUODENOSCOPY (EGD) WITH PROPOFOL;  Surgeon: Clarene Essex, MD;  Location: [facility];  Service: Gastroenterology;  Laterality: N/A;  . FLEXIBLE SIGMOIDOSCOPY N/A 06/12/2019   Procedure: FLEXIBLE SIGMOIDOSCOPY;  Surgeon: Clarene Essex, MD;  Location: [facility];  Service: Gastroenterology;  Laterality: N/A;  . NO PAST SURGERIES    . RIGHT HEART CATH N/A 04/17/2019   Procedure: RIGHT HEART CATH;  Surgeon: Larey Dresser, MD;  Location: [facility] CV LAB;  Service: Cardiovascular;  Laterality: N/A;        Home Medications    Prior to Admission medications   Medication Sig Start  Date End Date Taking? Authorizing Provider  albuterol (VENTOLIN HFA) 108 (90 Base) MCG/ACT inhaler Inhale 2 puffs into the lungs every 6 (six) hours as needed for wheezing or shortness of breath. 06/24/19   [name], NP  amLODipine (NORVASC) 10 MG tablet Take 1 tablet (10 mg total) by mouth daily. 06/04/19 09/02/19  [name], NP  atorvastatin (LIPITOR) 20 MG tablet Take 1 tablet (20 mg total) by mouth at bedtime. 06/04/19  09/02/19  [name], NP  Blood Glucose Monitoring Suppl (TRUE METRIX METER) w/Device KIT Use as instructed. Check blood glucose level by fingerstick twice per day. 03/18/19   [name], NP  carboxymethylcellulose (REFRESH PLUS) 0.5 % SOLN Place 1 drop into both eyes daily.     [provider]  docusate sodium (COLACE) 100 MG capsule Take 1 capsule (100 mg total) by mouth 2 (two) times daily. 03/01/19   Monica Becton, MD  ferrous sulfate 325 (65 FE) MG tablet Take 1 tablet (325 mg total) by mouth daily. Patient will pick up scripts today. 06/24/19 09/22/19  [name], NP  glucose blood (TRUE METRIX BLOOD GLUCOSE TEST) test strip Use as instructed. Check blood glucose level by fingerstick twice per day. 03/18/19   [name], NP  hydrocortisone (ANUSOL-HC) 25 MG suppository Place 1 suppository (25 mg total) rectally 2 (two) times daily. 06/04/19   [name], NP  labetalol (NORMODYNE) 200 MG tablet Take 1 tablet (200 mg total) by mouth 2 (two) times daily. 04/19/19   Kayleen Memos, DO  levalbuterol [facility] HFA) 45 MCG/ACT inhaler Inhale 2 puffs into the lungs every 4 (four) hours as needed for wheezing.     [provider]  metFORMIN (GLUCOPHAGE) 500 MG tablet Take 1 tablet (500 mg total) by mouth 2 (two) times a day. Patient will pick up scripts today. Patient taking differently: Take 500 mg by mouth 2 (two) times daily with a meal. Patient will pick up scripts today. 03/18/19 06/24/19  [name], NP  methocarbamol (ROBAXIN) 500 MG tablet Take 1 tablet (500 mg total) by mouth 3 (three) times daily. Patient not taking: Reported on 06/24/2019 06/04/19 07/04/19  [name], NP  omeprazole (PRILOSEC) 20 MG capsule Take 1 capsule (20 mg total) by mouth daily. 05/19/19   [name], NP  polyethylene glycol (MIRALAX / GLYCOLAX) 17 g packet Take 17 g by mouth daily as needed for severe constipation. 06/24/19 07/24/19  [name], NP  potassium  chloride SA (K-DUR) 20 MEQ tablet Take 2 tablets (40 mEq total) by mouth 2 (two) times daily. 06/05/19 07/05/19  Clegg, Amy D, NP  rivaroxaban (XARELTO) 20 MG TABS tablet Take 1 tablet (20 mg total) by mouth daily with supper. 04/19/19   Kayleen Memos, DO  senna-docusate (SENOKOT-S) 8.6-50 MG tablet Take 2 tablets by mouth 2 (two) times daily. Patient not taking: Reported on 06/26/2019 05/30/19 06/29/19  Elgergawy, Silver Huguenin, MD  torsemide (DEMADEX) 20 MG tablet Take 4 tablets (80 mg total) by mouth 2 (two) times daily. 05/28/19   Clegg, Amy D, NP  TRUEplus Lancets 28G MISC Use as instructed. Check blood glucose level by fingerstick twice per day. 03/18/19   [name], NP    Family History Family History  Problem Relation Age of Onset  . Hypertension Mother   . Diabetes Mother     Social History Social History   Tobacco Use  . Smoking status: Former Research scientist (life sciences)  .  Smokeless tobacco: Never Used  Substance Use Topics  . Alcohol use: Not Currently  . Drug use: Not Currently     Allergies   Patient has no known allergies.   Review of Systems Review of Systems All other systems reviewed and are negative except that which was mentioned in HPI   Physical Exam Updated Vital Signs BP (!) 181/80 (BP Location: Right Arm)   Pulse 79   Temp 98.6 F (37 C) (Oral)   Resp 18   SpO2 96%   Physical Exam Vitals signs and nursing note reviewed.  Constitutional:      General: He is not in acute distress.    Appearance: He is well-developed.  HENT:     Head: Normocephalic and atraumatic.  Eyes:     Conjunctiva/sclera: Conjunctivae normal.  Neck:     Musculoskeletal: Neck supple.  Cardiovascular:     Rate and Rhythm: Normal rate and regular rhythm.     Heart sounds: Normal heart sounds. No murmur.  Pulmonary:     Effort: Pulmonary effort is normal.     Breath sounds: Normal breath sounds.  Abdominal:     General: Bowel sounds are normal. There is no distension.     Palpations:  Abdomen is soft.     Tenderness: There is no abdominal tenderness.  Genitourinary:    Comments: Refused rectal exam Skin:    General: Skin is warm and dry.  Neurological:     Mental Status: He is alert and oriented to person, place, and time.     Comments: Fluent speech  Psychiatric:        Judgment: Judgment normal.      ED Treatments / Results  Labs (all labs ordered are listed, but only abnormal results are displayed) Labs Reviewed  COMPREHENSIVE METABOLIC PANEL - Abnormal; Notable for the following components:      Result Value   Albumin 3.4 (*)    All other components within normal limits  CBC - Abnormal; Notable for the following components:   RBC 3.49 (*)    Hemoglobin 8.0 (*)    HCT 27.7 (*)    MCV 79.4 (*)    MCH 22.9 (*)    MCHC 28.9 (*)    RDW 19.2 (*)    All other components within normal limits  POC OCCULT BLOOD, ED  TYPE AND SCREEN    EKG None  Radiology No results found.  Procedures Procedures (including critical care time)  Medications Ordered in ED Medications - No data to display   Initial Impression / Assessment and Plan / ED Course  I have reviewed the triage vital signs and the nursing notes.  Pertinent labs & imaging results that were available during my care of the patient were reviewed by me and considered in my medical decision making (see chart for details).        Comfortable and well-appearing on exam, mildly hypertensive, stable on home oxygen level.  He politely refused rectal exam.  His story of rectal bleeding beginning again after a hard bowel movement does suggest hemorrhoidal bleeding.  His hemoglobin today is 8 which is actually higher than it was at time of discharge from last hospitalization.  I have counseled on increasing MiraLAX and titrating to effect, adding fiber supplementation with water, and taking Colace twice daily.  He feels comfortable with this plan and will follow-up with GI and PCP.  He understands return  precautions. Final Clinical Impressions(s) / ED Diagnoses   Final diagnoses:  Bleeding hemorrhoids    ED Discharge Orders    None       Little, Wenda [place], MD 06/26/19 1845

## 2019-06-26 NOTE — Telephone Encounter (Signed)
CSW called [name] to check on the status of pt medication transfer- [name] has not requested transfer yet- CSW requested the reach out to [facility] again to try and request transfer.  CSW will continue to follow and assist as needed  Benjamin [name], [facility] Desk#: [phone] Cell#: [phone]

## 2019-06-27 ENCOUNTER — Telehealth (HOSPITAL_COMMUNITY): Payer: Self-pay | Admitting: Licensed Clinical Social Worker

## 2019-06-27 ENCOUNTER — Telehealth: Payer: Self-pay

## 2019-06-27 NOTE — Telephone Encounter (Signed)
Call received from patient stating that he needs a refill of his O2 tank.  He has the home concentrator but that is in his bedroom. He is in a boarding house and needs to use the O2 tanks to navigate through the house.  He said that he has no transportation to get to [facility] to pick up a tank. Patient also stated that he needs transportation to his appointment at [facility] tomorrow.   Call placed to [facility], spoke to [place] and explained the need for O2 and lack of transportation.  She stated that as per management, they are not able to deliver an O2 tank to patient. He will need to pick it up.   This CM spoke to [place], LCSW and explained above. She has contacted [name], EMT to see if she is able to pick up O2 for patient today. If she is not able, arrangements will need to be made tomorrow for O2 refill pick up.  She also noted that [facility] clinic may be able to help with cab transportation tomorrow.    Call placed to patient and provided update on transportation and O2 refill.

## 2019-06-27 NOTE — Telephone Encounter (Signed)
CSW informed by [facility] with [facility] that pt is low on oxygen and [facility] is not delivering oxygen.  CSW informed community paramedic who had gotten patients tanks refilled last time he needed- she will attempt to get refilled today.  CSW will continue to follow and assist as needed  [name], [facility] Desk#: [phone] Cell#: [phone]

## 2019-06-28 ENCOUNTER — Encounter: Payer: Self-pay | Admitting: Nurse Practitioner

## 2019-06-28 ENCOUNTER — Other Ambulatory Visit: Payer: Self-pay

## 2019-06-28 ENCOUNTER — Ambulatory Visit: Payer: Medicaid Other | Attending: Nurse Practitioner | Admitting: Nurse Practitioner

## 2019-06-28 VITALS — BP 148/89 | HR 91 | Temp 98.9°F | Resp 17 | Ht 71.0 in | Wt >= 6400 oz

## 2019-06-28 DIAGNOSIS — Z6841 Body Mass Index (BMI) 40.0 and over, adult: Secondary | ICD-10-CM | POA: Diagnosis not present

## 2019-06-28 DIAGNOSIS — I4891 Unspecified atrial fibrillation: Secondary | ICD-10-CM | POA: Insufficient documentation

## 2019-06-28 DIAGNOSIS — J9611 Chronic respiratory failure with hypoxia: Secondary | ICD-10-CM | POA: Diagnosis not present

## 2019-06-28 DIAGNOSIS — R0600 Dyspnea, unspecified: Secondary | ICD-10-CM | POA: Diagnosis not present

## 2019-06-28 DIAGNOSIS — I509 Heart failure, unspecified: Secondary | ICD-10-CM | POA: Insufficient documentation

## 2019-06-28 DIAGNOSIS — Z86711 Personal history of pulmonary embolism: Secondary | ICD-10-CM | POA: Diagnosis not present

## 2019-06-28 DIAGNOSIS — Z9981 Dependence on supplemental oxygen: Secondary | ICD-10-CM

## 2019-06-28 DIAGNOSIS — I110 Hypertensive heart disease with heart failure: Secondary | ICD-10-CM | POA: Diagnosis not present

## 2019-06-28 DIAGNOSIS — R0609 Other forms of dyspnea: Secondary | ICD-10-CM

## 2019-06-28 DIAGNOSIS — G4730 Sleep apnea, unspecified: Secondary | ICD-10-CM | POA: Diagnosis not present

## 2019-06-28 DIAGNOSIS — K5900 Constipation, unspecified: Secondary | ICD-10-CM | POA: Diagnosis not present

## 2019-06-28 DIAGNOSIS — Z79899 Other long term (current) drug therapy: Secondary | ICD-10-CM | POA: Diagnosis not present

## 2019-06-28 DIAGNOSIS — E11649 Type 2 diabetes mellitus with hypoglycemia without coma: Secondary | ICD-10-CM | POA: Insufficient documentation

## 2019-06-28 DIAGNOSIS — Z7901 Long term (current) use of anticoagulants: Secondary | ICD-10-CM | POA: Diagnosis not present

## 2019-06-28 DIAGNOSIS — E1165 Type 2 diabetes mellitus with hyperglycemia: Secondary | ICD-10-CM | POA: Diagnosis not present

## 2019-06-28 DIAGNOSIS — E669 Obesity, unspecified: Secondary | ICD-10-CM | POA: Diagnosis not present

## 2019-06-28 DIAGNOSIS — IMO0002 Reserved for concepts with insufficient information to code with codable children: Secondary | ICD-10-CM

## 2019-06-28 DIAGNOSIS — Z7984 Long term (current) use of oral hypoglycemic drugs: Secondary | ICD-10-CM | POA: Diagnosis not present

## 2019-06-28 LAB — GLUCOSE, POCT (MANUAL RESULT ENTRY): POC Glucose: 89 mg/dl (ref 70–99)

## 2019-06-28 MED ORDER — METFORMIN HCL 500 MG PO TABS: 500.0000 mg | ORAL_TABLET | Freq: Two times a day (BID) | ORAL | 1 refills | Status: DC

## 2019-06-28 MED ORDER — DOCUSATE SODIUM 100 MG PO CAPS: 100.0000 mg | ORAL_CAPSULE | Freq: Two times a day (BID) | ORAL | 1 refills | Status: AC

## 2019-06-28 NOTE — Progress Notes (Signed)
Assessment & Plan:  Benjamin [name] was seen today for o2 testing.  Diagnoses and all orders for this visit:  Chronic respiratory failure with hypoxia, on home O2 therapy ([place]) Order for portable O2 concentrator sent to DME company.   Dyspnea on exertion Continue O2 as prescribed.   Diabetes mellitus type 2, uncontrolled, with complications (HCC) -     Glucose (CBG) -     metFORMIN (GLUCOPHAGE) 500 MG tablet; Take 1 tablet (500 mg total) by mouth 2 (two) times daily with a meal. Patient will pick up scripts today. Continue blood sugar control as discussed in office today, low carbohydrate diet, and regular physical exercise as tolerated, 150 minutes per week (30 min each day, 5 days per week, or 50 min 3 days per week). Keep blood sugar logs with fasting goal of 90-130 mg/dl, post prandial (after you eat) less than 180.  For Hypoglycemia: BS <60 and Hyperglycemia BS >400; contact the clinic ASAP. Annual eye exams and foot exams are recommended.  Constipation, unspecified constipation type -     docusate sodium (COLACE) 100 MG capsule; Take 1 capsule (100 mg total) by mouth 2 (two) times daily.    Patient has been counseled on age-appropriate routine health concerns for screening and prevention. These are reviewed and up-to-date. Referrals have been placed accordingly. Immunizations are up-to-date or declined.    Subjective:   Chief Complaint  Patient presents with  . O2 Testing    Pt. is here for Oxygen testing walk.    HPI Benjamin [name] 53 y.o. male presents to office today to evaluate for chronic O2 requirements.  has a past medical history of A-fib ([place]), Anemia (05/29/2019), CHF (congestive heart failure) ([place]), Chronic respiratory failure ([place]), Diabetes mellitus without complication ([place]), Elevated troponin (02/06/2019), Hypertension, Obesity, Pulmonary embolism ([place]), Rectal bleeding (05/29/2019), and Sleep apnea.    He arrived to the office via [place]. Unfortunately he did  not have his O2 tank from [employer] so he required the use of the clinic O2 tank.    Walk test Results: RA at rest: O2 sats 97% RA with ambulation 100 feet: O2 sats  89%  2L O2 with ambulation 100 feet: O2 sats 98%      Review of Systems  Constitutional: Negative for fever, malaise/fatigue and weight loss.  HENT: Negative.  Negative for nosebleeds.   Eyes: Negative.  Negative for blurred vision, double vision and photophobia.  Respiratory: Positive for shortness of breath (chronic). Negative for cough.   Cardiovascular: Negative.  Negative for chest pain, palpitations and leg swelling.  Gastrointestinal: Positive for constipation. Negative for heartburn, nausea and vomiting.  Musculoskeletal: Negative.  Negative for myalgias.  Neurological: Negative.  Negative for dizziness, focal weakness, seizures and headaches.  Psychiatric/Behavioral: Negative.  Negative for suicidal ideas.    Past Medical History:  Diagnosis Date  . A-fib ([place])   . Anemia 05/29/2019  . CHF (congestive heart failure) ([place])   . Chronic respiratory failure ([place])   . Diabetes mellitus without complication ([place])   . Elevated troponin 02/06/2019  . Hypertension   . Obesity   . Pulmonary embolism ([place])   . Rectal bleeding 05/29/2019  . Sleep apnea     Past Surgical History:  Procedure Laterality Date  . COLONOSCOPY WITH PROPOFOL Left 02/25/2019   Procedure: COLONOSCOPY WITH PROPOFOL;  Surgeon: Arta Silence, MD;  Location: [facility];  Service: Endoscopy;  Laterality: Left;  . ESOPHAGOGASTRODUODENOSCOPY (EGD) WITH PROPOFOL Left 02/25/2019   Procedure: ESOPHAGOGASTRODUODENOSCOPY (EGD)  WITH PROPOFOL;  Surgeon: Arta Silence, MD;  Location: [facility];  Service: Endoscopy;  Laterality: Left;  . ESOPHAGOGASTRODUODENOSCOPY (EGD) WITH PROPOFOL N/A 06/12/2019   Procedure: ESOPHAGOGASTRODUODENOSCOPY (EGD) WITH PROPOFOL;  Surgeon: Clarene Essex, MD;  Location: [facility];  Service: Gastroenterology;  Laterality:  N/A;  . FLEXIBLE SIGMOIDOSCOPY N/A 06/12/2019   Procedure: FLEXIBLE SIGMOIDOSCOPY;  Surgeon: Clarene Essex, MD;  Location: [facility];  Service: Gastroenterology;  Laterality: N/A;  . NO PAST SURGERIES    . RIGHT HEART CATH N/A 04/17/2019   Procedure: RIGHT HEART CATH;  Surgeon: Larey Dresser, MD;  Location: [facility];  Service: Cardiovascular;  Laterality: N/A;    Family History  Problem Relation Age of Onset  . Hypertension Mother   . Diabetes Mother     Social History Reviewed with no changes to be made today.   Outpatient Medications Prior to Visit  Medication Sig Dispense Refill  . albuterol (VENTOLIN HFA) 108 (90 Base) MCG/ACT inhaler Inhale 2 puffs into the lungs every 6 (six) hours as needed for wheezing or shortness of breath. 18 g 0  . amLODipine (NORVASC) 10 MG tablet Take 1 tablet (10 mg total) by mouth daily. 90 tablet 1  . atorvastatin (LIPITOR) 20 MG tablet Take 1 tablet (20 mg total) by mouth at bedtime. 90 tablet 2  . Blood Glucose Monitoring Suppl (TRUE METRIX METER) w/Device KIT Use as instructed. Check blood glucose level by fingerstick twice per day. 1 kit 0  . carboxymethylcellulose (REFRESH PLUS) 0.5 % SOLN Place 1 drop into both eyes daily.     . ferrous sulfate 325 (65 FE) MG tablet Take 1 tablet (325 mg total) by mouth daily. Patient will pick up scripts today. 90 tablet 0  . glucose blood (TRUE METRIX BLOOD GLUCOSE TEST) test strip Use as instructed. Check blood glucose level by fingerstick twice per day. 100 each 12  . hydrocortisone (ANUSOL-HC) 25 MG suppository Place 1 suppository (25 mg total) rectally 2 (two) times daily. 36 suppository 1  . labetalol (NORMODYNE) 200 MG tablet Take 1 tablet (200 mg total) by mouth 2 (two) times daily. 120 tablet 0  . levalbuterol (XOPENEX HFA) 45 MCG/ACT inhaler Inhale 2 puffs into the lungs every 4 (four) hours as needed for wheezing.     Marland Kitchen omeprazole (PRILOSEC) 20 MG capsule Take 1 capsule (20 mg total) by mouth  daily. 30 capsule 3  . polyethylene glycol (MIRALAX / GLYCOLAX) 17 g packet Take 17 g by mouth daily as needed for severe constipation. 30 each 3  . potassium chloride SA (K-DUR) 20 MEQ tablet Take 2 tablets (40 mEq total) by mouth 2 (two) times daily. 360 tablet 3  . rivaroxaban (XARELTO) 20 MG TABS tablet Take 1 tablet (20 mg total) by mouth daily with supper. 60 tablet 0  . torsemide (DEMADEX) 20 MG tablet Take 4 tablets (80 mg total) by mouth 2 (two) times daily. 240 tablet 6  . TRUEplus Lancets 28G MISC Use as instructed. Check blood glucose level by fingerstick twice per day. 200 each 3  . docusate sodium (COLACE) 100 MG capsule Take 1 capsule (100 mg total) by mouth 2 (two) times daily. 60 capsule 2  . methocarbamol (ROBAXIN) 500 MG tablet Take 1 tablet (500 mg total) by mouth 3 (three) times daily. (Patient not taking: Reported on 06/24/2019) 60 tablet 1  . senna-docusate (SENOKOT-S) 8.6-50 MG tablet Take 2 tablets by mouth 2 (two) times daily. (Patient not taking: Reported on 06/26/2019) 60  tablet 3  . metFORMIN (GLUCOPHAGE) 500 MG tablet Take 1 tablet (500 mg total) by mouth 2 (two) times a day. Patient will pick up scripts today. (Patient taking differently: Take 500 mg by mouth 2 (two) times daily with a meal. Patient will pick up scripts today.) 180 tablet 0   No facility-administered medications prior to visit.     No Known Allergies     Objective:    BP (!) 148/89 (BP Location: Right Arm, Patient Position: Sitting, Cuff Size: Large)   Pulse 91   Temp 98.9 F (37.2 C) (Oral)   Resp 17   Ht _0  (1.803 m)   Wt (!) 403 lb (182.8 kg)   SpO2 97%   BMI 56.21 kg/m  Wt Readings from Last 3 Encounters:  06/28/19 (!) 403 lb (182.8 kg)  06/24/19 (!) 399 lb (181 kg)  06/13/19 (!) 411 lb 13.1 oz (186.8 kg)    Physical Exam Vitals signs and nursing note reviewed.  Constitutional:      Appearance: He is well-developed.  HENT:     Head: Normocephalic and atraumatic.  Neck:      Musculoskeletal: Normal range of motion.  Cardiovascular:     Rate and Rhythm: Normal rate and regular rhythm.     Heart sounds: Normal heart sounds. No murmur. No friction rub. No gallop.   Pulmonary:     Effort: Tachypnea present. No accessory muscle usage or respiratory distress.     Breath sounds: No decreased breath sounds, wheezing, rhonchi or rales.  Chest:     Chest wall: No tenderness.  Abdominal:     General: Bowel sounds are normal.     Palpations: Abdomen is soft.  Musculoskeletal: Normal range of motion.  Skin:    General: Skin is warm and dry.  Neurological:     Mental Status: He is alert and oriented to person, place, and time.     Coordination: Coordination normal.  Psychiatric:        Behavior: Behavior normal. Behavior is cooperative.        Thought Content: Thought content normal.        Judgment: Judgment normal.          Patient has been counseled extensively about nutrition and exercise as well as the importance of adherence with medications and regular follow-up. The patient was given clear instructions to go to ER or return to medical center if symptoms don't improve, worsen or new problems develop. The patient verbalized understanding.   Follow-up: Return in about 3 months (around 09/28/2019).   [name], FNP-BC [facility] [place], [place]   06/30/2019, 11:20 AM

## 2019-06-28 NOTE — Progress Notes (Signed)
Patient came into the clinic for Oxygen Walk Test. Pt arrived with no oxygen tank.   At [employer] resting w/o Oxygen: 97% Dropped down to 89% after second round of walking, was 97% then 94% at the beginning of the walk.  Pulse Ox with 2 liter of Oxygen: 98%

## 2019-06-30 ENCOUNTER — Encounter: Payer: Self-pay | Admitting: Nurse Practitioner

## 2019-06-30 MED ORDER — MISC. DEVICES MISC: 0 refills | Status: DC

## 2019-07-01 ENCOUNTER — Telehealth (HOSPITAL_COMMUNITY): Payer: Self-pay | Admitting: Licensed Clinical Social Worker

## 2019-07-01 NOTE — Telephone Encounter (Signed)
CSW called pt to follow up regarding current needs.  Pt confirms he was able to make it to his PCP appt last week and they were able to assist him in getting to [place] to refill O2 takes so he has plenty of O2 at this time.  Pt states he got notification from [employer] that they will be providing him with his full check each month ($783) and that they will be giving him back pay.  Pt now very anxious to get into his own housing situation.  CSW called [place] apartments to check back in regarding availability- unable to leave message.  Pt also inquired about getting to appt with [facility] pulmonology on Thursday- CSW to assist with setting up transport either through [place] [facility] or taxi.  CSW called [employer] office to inquire about status of pt application- they are in the process of finalizing it and state they will send a letter to patient in the next 1-2 weeks regarding their determination.  CSW will continue to follow and assist as needed  [name], [facility] Desk#: [phone] Cell#: [phone]

## 2019-07-02 ENCOUNTER — Telehealth: Payer: Self-pay

## 2019-07-02 ENCOUNTER — Other Ambulatory Visit (HOSPITAL_COMMUNITY): Payer: Self-pay

## 2019-07-02 NOTE — Telephone Encounter (Signed)
Call received from [facility].  She stated that she will need to determine what is needed to proceed with an order for POC.  She stated that medicaid will not pay for a home concentrator as well as a POC.  The patient uses the home concentrator with his BiPAP as night. [employer] has provided the patient with his BiPAP.   Call received from [facility]/[employer] [facility].  She stated that refills for back up tanks will not be delivered to the patient, they need to be picked up in their office Reminded her that the patient now has medicaid.  She explained that [employer] will not provide the patient with a POC until he first has a home fill O2 system set up in place of the current concentrator.  She said after the home fill system is in place, they can evaluate him for a POC. She could not confirm if [employer] is currently carrying POCs and explained that upper management determines who is approved for POCs.  She also was not able to confirm if the patient's BiPAP can be used with the home fill system.  She said that the current concentrator and portable tanks are used for uninsured. To set up the home fill system, as order is needed stating patient requires O2 @ 3L/min continuously via nasal canula and home fill syste,.

## 2019-07-02 NOTE — Telephone Encounter (Signed)
This CM spoke to [name], EMT.  She is requesting patient's medications be transferred to [facility].  She also noted that she will need to have patient's O2 tanks filled again today. He is not using the O2 concentrator in the house because it is connected to the BiPAP and he doesn't want to touch the BiPAP set up.    Call placed to [place] to inquire if patient can have O2 tanks delivered now that he has insurance.  Spoke to [place] who stated that she would need to check and call this CM back.   Call placed to [place], spoke to to [name].  He stated that [facility] needs to fax the orders to him to transfer medications. This CM spoke with Opal Sidles , [place]  who will have prescriptions transferred as requested.   Call placed to patient and informed him of medication transfer as requested by [facility].  Also informed him of outreach  made to [employer] to inquire about disconnecting  O2 concentrator from BiPAP. He said that he is afraid to do anything with the BiPAP set up.   Call placed to [facility] , spoke to [place], [place] # [phone] and explained that the patient is not using the home concentrator because he is afraid to disconnect the BiPAP. Randall Hiss stated that he would call the patient and explain how to disconnect the equipment. Randall Hiss also noted that they carry POCs and he will have this sales rep, Suanne Marker, contact this CM about POC  possible for the patient.

## 2019-07-02 NOTE — Progress Notes (Signed)
Paramedicine Encounter    Patient ID: Benjamin [name], male    DOB: April 04, 1966, 53 y.o.   MRN: [id number]   Patient Care Team: [name], NP as PCP - General (Nurse Practitioner) [name], MD as PCP - Cardiology (Cardiology) [name], LCSW as Social Worker (Licensed Clinical Social Worker)  Patient Active Problem List   Diagnosis Date Noted  . BRBPR (bright red blood per rectum) 06/12/2019  . Hypertension   . CHF (congestive heart failure) ([place])   . Sleep apnea   . Chronic respiratory failure ([place])   . Lower GI bleeding 06/11/2019  . Hypokalemia 05/29/2019  . GI bleed 05/29/2019  . Anemia 05/28/2019  . Acute on chronic diastolic CHF (congestive heart failure) ([place]) 04/11/2019  . Iron deficiency anemia 03/11/2019  . OSA on CPAP 03/11/2019  . HLD (hyperlipidemia) 03/11/2019  . Elevated troponin 03/11/2019  . GERD (gastroesophageal reflux disease) 03/11/2019  . Rectal bleeding 02/20/2019  . Dyspnea 02/06/2019  . COVID-19 virus infection 02/06/2019  . Bilateral lower extremity edema   . Morbid obesity with BMI of 50.0-59.9, adult ([place])   . Chronic diastolic CHF (congestive heart failure) ([place])   . PAF (paroxysmal atrial fibrillation) ([place])   . PE (pulmonary thromboembolism) ([place]) 01/21/2019  . Hypertensive urgency 01/21/2019  . Diabetes mellitus type 2 in obese ([place]) 01/21/2019    Current Outpatient Medications:  .  albuterol (VENTOLIN HFA) 108 (90 Base) MCG/ACT inhaler, Inhale 2 puffs into the lungs every 6 (six) hours as needed for wheezing or shortness of breath., Disp: 18 g, Rfl: 0 .  amLODipine (NORVASC) 10 MG tablet, Take 1 tablet (10 mg total) by mouth daily., Disp: 90 tablet, Rfl: 1 .  atorvastatin (LIPITOR) 20 MG tablet, Take 1 tablet (20 mg total) by mouth at bedtime., Disp: 90 tablet, Rfl: 2 .  Blood Glucose Monitoring Suppl (TRUE METRIX METER) w/Device KIT, Use as instructed. Check blood glucose level by fingerstick twice per day., Disp: 1 kit,  Rfl: 0 .  carboxymethylcellulose (REFRESH PLUS) 0.5 % SOLN, Place 1 drop into both eyes daily. , Disp: , Rfl:  .  docusate sodium (COLACE) 100 MG capsule, Take 1 capsule (100 mg total) by mouth 2 (two) times daily., Disp: 180 capsule, Rfl: 1 .  ferrous sulfate 325 (65 FE) MG tablet, Take 1 tablet (325 mg total) by mouth daily. Patient will pick up scripts today., Disp: 90 tablet, Rfl: 0 .  glucose blood (TRUE METRIX BLOOD GLUCOSE TEST) test strip, Use as instructed. Check blood glucose level by fingerstick twice per day., Disp: 100 each, Rfl: 12 .  hydrocortisone (ANUSOL-HC) 25 MG suppository, Place 1 suppository (25 mg total) rectally 2 (two) times daily., Disp: 36 suppository, Rfl: 1 .  labetalol (NORMODYNE) 200 MG tablet, Take 1 tablet (200 mg total) by mouth 2 (two) times daily., Disp: 120 tablet, Rfl: 0 .  levalbuterol (XOPENEX HFA) 45 MCG/ACT inhaler, Inhale 2 puffs into the lungs every 4 (four) hours as needed for wheezing. , Disp: , Rfl:  .  metFORMIN (GLUCOPHAGE) 500 MG tablet, Take 1 tablet (500 mg total) by mouth 2 (two) times daily with a meal. Patient will pick up scripts today., Disp: 180 tablet, Rfl: 1 .  Misc. Devices MISC, Please provide [name] with insurance approved portable O2 concentrator ICD 10 J96.11 Z99.81, Disp: 1 each, Rfl: 0 .  omeprazole (PRILOSEC) 20 MG capsule, Take 1 capsule (20 mg total) by mouth daily., Disp: 30 capsule, Rfl: 3 .  polyethylene glycol (MIRALAX / GLYCOLAX) 17 g packet, Take 17 g by mouth daily as needed for severe constipation., Disp: 30 each, Rfl: 3 .  potassium chloride SA (K-DUR) 20 MEQ tablet, Take 2 tablets (40 mEq total) by mouth 2 (two) times daily., Disp: 360 tablet, Rfl: 3 .  rivaroxaban (XARELTO) 20 MG TABS tablet, Take 1 tablet (20 mg total) by mouth daily with supper., Disp: 60 tablet, Rfl: 0 .  torsemide (DEMADEX) 20 MG tablet, Take 4 tablets (80 mg total) by mouth 2 (two) times daily., Disp: 240 tablet, Rfl: 6 .  TRUEplus Lancets 28G  MISC, Use as instructed. Check blood glucose level by fingerstick twice per day., Disp: 200 each, Rfl: 3 .  methocarbamol (ROBAXIN) 500 MG tablet, Take 1 tablet (500 mg total) by mouth 3 (three) times daily. (Patient not taking: Reported on 06/24/2019), Disp: 60 tablet, Rfl: 1 No Known Allergies    Social History   Socioeconomic History  . Marital status: Married    Spouse name: Not on file  . Number of children: Not on file  . Years of education: Not on file  . Highest education level: Not on file  Occupational History  . Not on file  Social Needs  . Financial resource strain: Not on file  . Food insecurity    Worry: Not on file    Inability: Not on file  . Transportation needs    Medical: Not on file    Non-medical: Not on file  Tobacco Use  . Smoking status: Former Research scientist (life sciences)  . Smokeless tobacco: Never Used  Substance and Sexual Activity  . Alcohol use: Not Currently  . Drug use: Not Currently  . Sexual activity: Not Currently  Lifestyle  . Physical activity    Days per week: Not on file    Minutes per session: Not on file  . Stress: Not on file  Relationships  . Social Herbalist on phone: Not on file    Gets together: Not on file    Attends religious service: Not on file    Active member of club or organization: Not on file    Attends meetings of clubs or organizations: Not on file    Relationship status: Not on file  . Intimate partner violence    Fear of current or ex partner: Not on file    Emotionally abused: Not on file    Physically abused: Not on file    Forced sexual activity: Not on file  Other Topics Concern  . Not on file  Social History Narrative  . Not on file    Physical Exam      Future Appointments  Date Time Provider [place]  07/04/2019 11:45 AM [name], MD [facility] None  08/05/2019 11:30 AM [facility] PA/NP [facility] None  09/30/2019  1:30 PM [name], NP [facility] None    BP (!) 160/0   Pulse 88    Temp 97.7 F (36.5 C)   Resp 18   Wt (!) 398 lb (180.5 kg)   SpO2 98%   BMI 55.51 kg/m   Weight yesterday-400 Last visit weight-399  Pt reports he is doing ok,  He got a lot of paperwork from [place] in the mail so that was sorted through. I called his caseworker about his PCP per his request and it was already on file in the system for his PCP care.  He received letter regarding his back pay and benefit information.  He  took extra dose of his meds so he didn't take any meds this morning.  He denies any bleeding.  He denies sob.  He was able to get his 02 filled last week. He did walk test last week at PCP-he was ordered portable 02.  He is unable to use his 02 in the bedroom due to it being hooked up to his bipap so when he is in his room he is not able to use it, he has to use the portable tanks he has now.  He needs the concentrator to fill his portable tanks up too.  Will ask Opal Sidles about that.  He will need more tanks filled tomor. I will go tomor to swap out those tanks.  He states since he has stopped bleeding he has felt better.  He needs his glucometer supplies sent to [facility].  He needs his meds sent in to pharmacy--he is out of potassium in fri-sat pm dose.  He has not taken his meds this morn yet, he will take it at lunch.  meds verified and pill box refilled.  He reports he needs nasal cannulas.   [name], [facility] Paramedic  07/02/19

## 2019-07-03 ENCOUNTER — Other Ambulatory Visit: Payer: Self-pay | Admitting: Nurse Practitioner

## 2019-07-03 ENCOUNTER — Telehealth (HOSPITAL_COMMUNITY): Payer: Self-pay | Admitting: *Deleted

## 2019-07-03 ENCOUNTER — Telehealth (HOSPITAL_COMMUNITY): Payer: Self-pay | Admitting: Licensed Clinical Social Worker

## 2019-07-03 MED ORDER — MISC. DEVICES MISC: 0 refills | Status: DC

## 2019-07-03 NOTE — Telephone Encounter (Signed)
Order has been placed.

## 2019-07-03 NOTE — Telephone Encounter (Signed)
CSW continuing to work with pt on finding housing.  Called multiple apartment sites this morning within pt price range and are awaiting return calls regarding availability.  Pt also states that he got a notice from [employer] where [place] assisted pt in applying for a government phone- states they need further information- pt to have Community Paramedic to bring to [place] so I can assist in following up.  CSW will continue to follow and assist as needed  [name], [facility] Desk#: [phone] Cell#: [phone]

## 2019-07-03 NOTE — Telephone Encounter (Signed)
Previous goals: LONG TERM:  - Weight loss (300 lbs)  - "Get my breath back"  - "Get my life back, get on my own again"  - "Chill out"   SHORT TERM: Last week: Patient did not feel well today per acute issues. Appointment for Exercise and wellness coaching will follow-up next week, per CEP. After a few questions prior to our scheduled session, patient presented with contraindications for further exercise guidance today.   Progress Note: Patient had an emergency department visit in the last week and has been visiting doctors and other appointments. He says he has been doing a lot of walking by going back and forth between appointments. He still feels the same shortness of breath and leg fatigue and pain. He is ready to get out of the facility and back on track in his independence. He mentioned that he cannot keep his mind off of that. He wants to enter cardiac rehab after he is out of the facility and able to start the enrollment process. He says this will clear his mind and that he wants to keep exercise as a high priority in his life, particularly once he is released from the housing facility. He says he is losing spirit as he is waiting to find somewhere to live.    Achievements: - He walked the ramp 3 times to destress and get away from the crowds outside  - He felt good after walking as this cleared his mind   Challenges/Frustrations: - Crowds in the facility - He is ready to go and be on his own  - He needs some oxygen and the carrying tote bag to move around better with oxygen  - He is anxious to be away from the people in the facility as he dislikes them   New Weekly Goal (s): - Walk to help with managing his frustrations either to prevent frustration or to cope - Walk outside daily for 15 minutes (weather permitting and when he is allowed) to pick back up where he was before he had the bleeding - Report back next week about how much he did this and his affect on his  frustration  Next Session:   One week from today: Wednesday 10/21   Mr. Hodes was very frustrated and discussed this in much detail of the session today. He stated he needed a coping mechanism and he self-identified that walking outside helped to clear his mind. He set the goals for the next week in hopes of managing his frustration with living in the facility with people he does not care to be around. CEP will follow up next week for report and re-evaluation of exercise action plan.      Landis Martins, MS, ACSM-RCEP Clinical Exercise Physiologist

## 2019-07-04 ENCOUNTER — Telehealth: Payer: Self-pay | Admitting: Nurse Practitioner

## 2019-07-04 ENCOUNTER — Telehealth: Payer: Self-pay | Admitting: General Surgery

## 2019-07-04 ENCOUNTER — Other Ambulatory Visit: Payer: Self-pay

## 2019-07-04 ENCOUNTER — Encounter: Payer: Self-pay | Admitting: Pulmonary Disease

## 2019-07-04 ENCOUNTER — Telehealth (HOSPITAL_COMMUNITY): Payer: Self-pay | Admitting: Licensed Clinical Social Worker

## 2019-07-04 ENCOUNTER — Ambulatory Visit (INDEPENDENT_AMBULATORY_CARE_PROVIDER_SITE_OTHER): Payer: Medicaid Other | Admitting: Pulmonary Disease

## 2019-07-04 VITALS — BP 138/88 | HR 91 | Temp 97.4°F | Ht 71.0 in | Wt >= 6400 oz

## 2019-07-04 DIAGNOSIS — G4733 Obstructive sleep apnea (adult) (pediatric): Secondary | ICD-10-CM

## 2019-07-04 DIAGNOSIS — E662 Morbid (severe) obesity with alveolar hypoventilation: Secondary | ICD-10-CM

## 2019-07-04 DIAGNOSIS — J9611 Chronic respiratory failure with hypoxia: Secondary | ICD-10-CM

## 2019-07-04 NOTE — Telephone Encounter (Signed)
Seen in office 07/04/19.

## 2019-07-04 NOTE — Telephone Encounter (Signed)
[facility] to determine if their office was the one that ordered the BIPAP machine (used per patient). And if so who is the DME company?  Rep took message and said it would be forwarded to Archie Patten, NP. I advised the patient was in the room with Dr. Halford Chessman and we needed confirmation as to what type of machine, settings, etc as well.

## 2019-07-04 NOTE — Patient Instructions (Signed)
Will arrange for CPAP 17 cm water pressure and you will need to plug in 2 liters oxygen at night with CPAP machine  Follow up in 6 to 8 weeks

## 2019-07-04 NOTE — Telephone Encounter (Signed)
CSW called for taxi for patient to get to pulmonary follow up appt this morning- confirmed with pt the time he will get picked up and provided him with number to taxi company to call when he is done with his appt.  Community paramedic had sent pt notice from [place] phone benefit that they required a document from the pt prior to making a final decision.  CSW able to fill out document online and will have paramedic take form out to pt to sign so we can finalize application for pt to get government phone.  CSW will continue to follow and assist as needed  [name], [facility] Desk#: [phone] Cell#: [phone]

## 2019-07-04 NOTE — Telephone Encounter (Signed)
Spoke with [name], social worker she states the hospital paid for CPAP for patient. Machine was purchased from [employer]. Eric from [employer] patient up on machine per Dr. Juanetta Gosling recommendations after sleep study.   [name] gave me Eric's number [phone].  ATC Eric unable to reach .  I talked with the PCCs and ask they delay the new order as this machine was purchased by the hospital through charity.   Will route to Dr. Halford Chessman as Juluis Rainier and wait for Randall Hiss to call back

## 2019-07-04 NOTE — Telephone Encounter (Signed)
ATC eric back unable to reach told to call back tomorrow 07/05/19 as our phone turn off at 1700

## 2019-07-04 NOTE — Telephone Encounter (Signed)
[name] calling a/b this pt and would like to speak to nurse concerning this pt bipap  Wanting to know if a new one was ordered say he does not need one has already gotten one through [facility] call her @  [phone].Hillery Hunter

## 2019-07-04 NOTE — Telephone Encounter (Signed)
New Message   Nurse from [facility] is calling because Dr. Halford Chessman is trying to figure out who ordered the pt's cpap. They would like to know if Benjamin [name] ordered it and if so who is the DME company and is is a cpap or bypap. Please f/u

## 2019-07-04 NOTE — Progress Notes (Signed)
[facility]  Chief Complaint  Patient presents with  . Observed Sleep Apnea    Constitutional:  BP 138/88 (BP Location: Left Arm, Patient Position: Sitting, Cuff Size: Large)   Pulse 91   Temp (!) 97.4 F (36.3 C)   Ht '5\' 11"'$  (1.803 m)   Wt (!) 409 lb 9.6 oz (185.8 kg)   SpO2 98% Comment: on 2L continuous  BMI 57.13 kg/m   Past Medical History:  PE, HTN, DM, A fib, PNA May 2020  Brief Summary:  Benjamin [name] is a 53 y.o. male with obstructive sleep apnea.  He had CPAP titration study done in August.  He seemed to do best with CPAP 17 cm H2O and needed 2 liters oxygen.    He reports having a PAP machine.  He doesn't know what type of machine he has or where he got it from.  He tried calling his social worker to find out more details, but went to voice mail.  He had company that got his machine listed as [employer]".  Physical Exam:   Appearance - wearing oxygen  ENMT - no sinus tenderness, no nasal discharge, no oral exudate  Neck - no masses, trachea midline, no thyromegaly, no elevation in JVP  Respiratory - normal appearance of chest wall, normal respiratory effort w/o accessory muscle use, no dullness on percussion, no wheezing or rales  CV - s1s2 regular rate and rhythm, no murmurs, no peripheral edema, radial pulses symmetric  GI - soft, non tender  Lymph - no adenopathy noted in neck and axillary areas  MSK - normal gait  Ext - no cyanosis, clubbing, or joint inflammation noted  Skin - no rashes, lesions, or ulcers  Neuro - normal strength, oriented x 3  Psych - normal mood and affect    Assessment/Plan:   Obstructive sleep apnea. 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 - continue 2  liters oxygen 24/7 including with CPAP at night  Morbid obesity. - needs to lose weight if he wants to live  Driving precautions. - reviewed with patient  Patient Instructions  Will arrange for CPAP 17 cm water pressure and you will need to plug in 2 liters oxygen at night with CPAP machine  Follow up in 6 to 8 weeks    [name], MD  Pulmonary/Critical Care Pager: [phone] 07/04/2019, 12:29 PM  Flow Sheet    Pulmonary tests:  ABG RA 04/19/19 >> pH 7.44, PCO2 49.5, PO2 59.9  Chest imaging:  CT angio chest 01/21/19 >> b/l lower lobe PE CT angio chest 02/26/19 >> no PE  Sleep tests:  PSG 05/17/19 >> AHI 128.1, SpO2 low 51%.  CPAP 17 cm H2O with 2 liters oxygen.  Cardiac tests:  Echo 01/22/19 >> EF 60 to 65%, mild LVH RHC 04/17/19 >> pulmonary venous hypertension   Medications:   Allergies as of 07/04/2019   No Known Allergies     Medication List       Accurate as of July 04, 2019 12:29 PM. If you have any questions, ask your nurse or doctor.        albuterol 108 (90 Base) MCG/ACT inhaler Commonly known as: VENTOLIN HFA Inhale 2 puffs into the lungs every 6 (  six) hours as needed for wheezing or shortness of breath.   amLODipine 10 MG tablet Commonly known as: NORVASC Take 1 tablet (10 mg total) by mouth daily.   atorvastatin 20 MG tablet Commonly known as: LIPITOR Take 1 tablet (20 mg total) by mouth at bedtime.   carboxymethylcellulose 0.5 % Soln Commonly known as: REFRESH PLUS Place 1 drop into both eyes daily.   docusate sodium 100 MG capsule Commonly known as: COLACE Take 1 capsule (100 mg total) by mouth 2 (two) times daily.   ferrous sulfate 325 (65 FE) MG tablet Take 1 tablet (325 mg total) by mouth daily. Patient will pick up scripts today.   hydrocortisone 25 MG suppository Commonly known as: ANUSOL-HC Place 1 suppository (25 mg total) rectally 2 (two) times daily.   labetalol 200 MG tablet Commonly known as: NORMODYNE  Take 1 tablet (200 mg total) by mouth 2 (two) times daily.   levalbuterol 45 MCG/ACT inhaler Commonly known as: XOPENEX HFA Inhale 2 puffs into the lungs every 4 (four) hours as needed for wheezing.   metFORMIN 500 MG tablet Commonly known as: GLUCOPHAGE Take 1 tablet (500 mg total) by mouth 2 (two) times daily with a meal. Patient will pick up scripts today.   methocarbamol 500 MG tablet Commonly known as: Robaxin Take 1 tablet (500 mg total) by mouth 3 (three) times daily.   Misc. Devices Misc Please provide [name] with insurance approved portable O2 concentrator ICD 10 J96.11 Z99.81   Misc. Devices Misc Requires O2 @ 3L/min continuously via nasal canula and home fill system,.   omeprazole 20 MG capsule Commonly known as: PRILOSEC Take 1 capsule (20 mg total) by mouth daily.   polyethylene glycol 17 g packet Commonly known as: MIRALAX / GLYCOLAX Take 17 g by mouth daily as needed for severe constipation.   potassium chloride SA 20 MEQ tablet Commonly known as: KLOR-CON Take 2 tablets (40 mEq total) by mouth 2 (two) times daily.   rivaroxaban 20 MG Tabs tablet Commonly known as: XARELTO Take 1 tablet (20 mg total) by mouth daily with supper.   torsemide 20 MG tablet Commonly known as: DEMADEX Take 4 tablets (80 mg total) by mouth 2 (two) times daily.   True Metrix Blood Glucose Test test strip Generic drug: glucose blood Use as instructed. Check blood glucose level by fingerstick twice per day.   True Metrix Meter w/Device Kit Use as instructed. Check blood glucose level by fingerstick twice per day.   TRUEplus Lancets 28G Misc Use as instructed. Check blood glucose level by fingerstick twice per day.       Past Surgical History:  He  has a past surgical history that includes No past surgeries; Esophagogastroduodenoscopy (egd) with propofol (Left, 02/25/2019); Colonoscopy with propofol (Left, 02/25/2019); RIGHT HEART CATH (N/A, 04/17/2019);  Esophagogastroduodenoscopy (egd) with propofol (N/A, 06/12/2019); and Flexible sigmoidoscopy (N/A, 06/12/2019).  Family History:  His family history includes Diabetes in his mother; Hypertension in his mother.  Social History:  He  reports that he has quit smoking. He has never used smokeless tobacco. He reports previous alcohol use. He reports previous drug use.

## 2019-07-05 ENCOUNTER — Telehealth (HOSPITAL_COMMUNITY): Payer: Self-pay

## 2019-07-05 ENCOUNTER — Telehealth (HOSPITAL_COMMUNITY): Payer: Self-pay | Admitting: Licensed Clinical Social Worker

## 2019-07-05 ENCOUNTER — Telehealth: Payer: Self-pay | Admitting: Pulmonary Disease

## 2019-07-05 NOTE — Telephone Encounter (Signed)
Spoke with [place] from [facility] and they wanted to advise Dr. Halford Chessman that the pt does not need a new CPAP machine because he has one that was purchased through the financial assistance program through the hospital. She stated that she faxed all Dr. Juanetta Gosling notes to [facility] and they have the correct settings on pt's CPAP. They will be servicing him and they just wanted to let VS know that everything was handled. Pcc's can cancel that order to [employer]?

## 2019-07-05 NOTE — Telephone Encounter (Signed)
CSW received call from pt stating that he set up a bank account and now needs to be set up with direct deposit for his social security check.  CSW and pt called Social Security and assisted in setting patient up with direct deposit- will hopefully start next month.  CSW also helped pt complete housing applications over the phone- will have paramedic take out to pt to sign next week so we can get patient on waitlists for housing.  Pt also reports his leg is swollen like it was last time prior to having to come to the hospital- CSW informed RN who will follow up with pt  CSW will continue to follow and assist as needed  [name], [facility] Desk#: [phone] Cell#: [phone]

## 2019-07-05 NOTE — Telephone Encounter (Signed)
This message can be closed. I spoke with [name] today and they have what they need for pt's CPAP and he doesn't need a new order.

## 2019-07-05 NOTE — Telephone Encounter (Signed)
Pt call c/o lower left leg swelling. Pt denies any swelling on the right. Denies pain in left leg. States that leg is tight to the touch and no noticeable temperature difference in limbs. Pt states that he had a similar experience in June of this year and that he had ultrasounds done on both legs to rule out blood clots. Pt states that diuretic increase helped him the most last time this happened. Reviewed with Amy NP-C. Per Amy NP-C pt is to take an extra 20mg  of torsemide for the next two days only, then resume regular 80mg  bid and he is to call us Monday if the swelling has not gotten any better. Pt verbalized understanding with teach back. No further questions at this time.

## 2019-07-08 ENCOUNTER — Telehealth (HOSPITAL_COMMUNITY): Payer: Self-pay | Admitting: Licensed Clinical Social Worker

## 2019-07-08 NOTE — Telephone Encounter (Signed)
CSW continuing to assist with finding housing options for patient.  Called several more income based apartment sites and picked up 3 housing applications for patient.  Will assist in filling out and helping pt set up in person applications.  CSW will continue to follow and assist as needed  [name], [facility] Desk#: [phone] Cell#: [phone]

## 2019-07-08 NOTE — Telephone Encounter (Signed)
Community message sent to [employer] to cancel order

## 2019-07-09 ENCOUNTER — Other Ambulatory Visit: Payer: Self-pay

## 2019-07-09 ENCOUNTER — Other Ambulatory Visit (HOSPITAL_COMMUNITY): Payer: Self-pay

## 2019-07-09 ENCOUNTER — Ambulatory Visit (HOSPITAL_COMMUNITY)
Admission: RE | Admit: 2019-07-09 | Discharge: 2019-07-09 | Disposition: A | Discharge status: Home / Self Care | Payer: Medicaid Other | Source: Ambulatory Visit | Attending: Adult Health | Admitting: Adult Health

## 2019-07-09 ENCOUNTER — Telehealth (HOSPITAL_COMMUNITY): Payer: Self-pay | Admitting: Licensed Clinical Social Worker

## 2019-07-09 ENCOUNTER — Encounter (HOSPITAL_COMMUNITY): Payer: Self-pay

## 2019-07-09 VITALS — BP 160/103 | HR 92 | Wt >= 6400 oz

## 2019-07-09 DIAGNOSIS — Z6841 Body Mass Index (BMI) 40.0 and over, adult: Secondary | ICD-10-CM | POA: Insufficient documentation

## 2019-07-09 DIAGNOSIS — K219 Gastro-esophageal reflux disease without esophagitis: Secondary | ICD-10-CM | POA: Diagnosis not present

## 2019-07-09 DIAGNOSIS — Z7984 Long term (current) use of oral hypoglycemic drugs: Secondary | ICD-10-CM | POA: Insufficient documentation

## 2019-07-09 DIAGNOSIS — I5033 Acute on chronic diastolic (congestive) heart failure: Secondary | ICD-10-CM

## 2019-07-09 DIAGNOSIS — Z86711 Personal history of pulmonary embolism: Secondary | ICD-10-CM | POA: Insufficient documentation

## 2019-07-09 DIAGNOSIS — I130 Hypertensive heart and chronic kidney disease with heart failure and stage 1 through stage 4 chronic kidney disease, or unspecified chronic kidney disease: Secondary | ICD-10-CM | POA: Diagnosis not present

## 2019-07-09 DIAGNOSIS — Z8619 Personal history of other infectious and parasitic diseases: Secondary | ICD-10-CM | POA: Diagnosis not present

## 2019-07-09 DIAGNOSIS — E669 Obesity, unspecified: Secondary | ICD-10-CM | POA: Insufficient documentation

## 2019-07-09 DIAGNOSIS — Z7901 Long term (current) use of anticoagulants: Secondary | ICD-10-CM | POA: Insufficient documentation

## 2019-07-09 DIAGNOSIS — Z87891 Personal history of nicotine dependence: Secondary | ICD-10-CM | POA: Diagnosis not present

## 2019-07-09 DIAGNOSIS — R0601 Orthopnea: Secondary | ICD-10-CM | POA: Insufficient documentation

## 2019-07-09 DIAGNOSIS — K921 Melena: Secondary | ICD-10-CM | POA: Insufficient documentation

## 2019-07-09 DIAGNOSIS — G4733 Obstructive sleep apnea (adult) (pediatric): Secondary | ICD-10-CM

## 2019-07-09 DIAGNOSIS — Z79899 Other long term (current) drug therapy: Secondary | ICD-10-CM | POA: Insufficient documentation

## 2019-07-09 DIAGNOSIS — D649 Anemia, unspecified: Secondary | ICD-10-CM | POA: Diagnosis not present

## 2019-07-09 DIAGNOSIS — Z8249 Family history of ischemic heart disease and other diseases of the circulatory system: Secondary | ICD-10-CM | POA: Diagnosis not present

## 2019-07-09 DIAGNOSIS — I480 Paroxysmal atrial fibrillation: Secondary | ICD-10-CM

## 2019-07-09 DIAGNOSIS — N182 Chronic kidney disease, stage 2 (mild): Secondary | ICD-10-CM | POA: Diagnosis not present

## 2019-07-09 MED ORDER — FUROSEMIDE 10 MG/ML IJ SOLN
80.0000 mg | Freq: Once | INTRAMUSCULAR | Status: AC
  Administered 2019-07-09: 80 mg via INTRAVENOUS
  Filled 2019-07-09: qty 8

## 2019-07-09 MED ORDER — TORSEMIDE 100 MG PO TABS: 100.0000 mg | ORAL_TABLET | Freq: Two times a day (BID) | ORAL | 3 refills | Status: DC

## 2019-07-09 MED ORDER — POTASSIUM CHLORIDE CRYS ER 20 MEQ PO TBCR
40.0000 meq | EXTENDED_RELEASE_TABLET | Freq: Once | ORAL | Status: AC
  Administered 2019-07-09: 40 meq via ORAL
  Filled 2019-07-09: qty 2

## 2019-07-09 NOTE — Progress Notes (Signed)
PCP: Juluis Mire NP Primary HF Cardiologist: Dr Aundra Dubin  GI: Dr Paulita Fujita   HPI: Benjamin [name] is a 53 yo with hx of diastolic CHF, HTN, DM, Atrial fib, PE in April 2020 (on Xarelto), OSA, GERD,chronic elevation of troponin.    Admitted 01/21/2019 with increased dyspnea and had PE. He was placed on back on coumadin .  Patient re-admitted 5/20 for syncope/cough/dyspnea, COVID+, CT positive for bilateral GGOs, admitted to [facility] and discharged after 6 days, on O2. Rec'd Cards f/u.  Patient re-admitted 02/20/19 for bright red blood per rectum, eval'ed by GI, had EGD/colon, dx'd with hemorrhoidal bleeding, discharged after 9 days on O2. He was again Covid + .  Readmitted 03/11/19 with increased shortness of breath. He did not have home oxygen. He was discharged to home the next day.   He presented to [facility] ED 04/11/19 with lower extremity edema . COVID negative. Diuresed with lasix drip and transitioned to torsemide 40 mg twice daily. Had RHC as noted below with mild volume overload and preserved cardiac output. Discharge weight was 407.9 pounds.   He was readmitted with symptomatic anemia 9/8 with hgb 7.1. Received 1UPRBCs. GI consulted. He started on anusol and continued on stool softner.   Today he returns for an acute HF visit. Complaining of increased leg edema and weight gain. Overall feeling fair. He continues to use oxygen 2-3 liters continuously. SOB with exertion. Denies PND. + Orthopnea.  Denies bleeding issues. Appetite ok. Eating high sodium food --> [employer] j[employer] chicken. No fever or chills. Taking all medications. Followed closely by HF Paramedicine.   Echo 01/2019:EF 60-65%, grade I DD  RHC 04/17/2019  RA mean 12 RV 37/18 PA 40/25, mean 31 PCWP mean 23 Oxygen saturations: PA 64% AO 100% Cardiac Output (Fick) 7.07  Cardiac Index (Fick) 2.47 PVR 1.1 WU  ROS: All systems negative except as listed in HPI, PMH and Problem List.  SH:  Social History   Socioeconomic History  .  Marital status: Married    Spouse name: Not on file  . Number of children: Not on file  . Years of education: Not on file  . Highest education level: Not on file  Occupational History  . Not on file  Social Needs  . Financial resource strain: Not on file  . Food insecurity    Worry: Not on file    Inability: Not on file  . Transportation needs    Medical: Not on file    Non-medical: Not on file  Tobacco Use  . Smoking status: Former Research scientist (life sciences)  . Smokeless tobacco: Never Used  Substance and Sexual Activity  . Alcohol use: Not Currently  . Drug use: Not Currently  . Sexual activity: Not Currently  Lifestyle  . Physical activity    Days per week: Not on file    Minutes per session: Not on file  . Stress: Not on file  Relationships  . Social Herbalist on phone: Not on file    Gets together: Not on file    Attends religious service: Not on file    Active member of club or organization: Not on file    Attends meetings of clubs or organizations: Not on file    Relationship status: Not on file  . Intimate partner violence    Fear of current or ex partner: Not on file    Emotionally abused: Not on file    Physically abused: Not on file    Forced sexual activity:  Not on file  Other Topics Concern  . Not on file  Social History Narrative  . Not on file    FH:  Family History  Problem Relation Age of Onset  . Hypertension Mother   . Diabetes Mother     Past Medical History:  Diagnosis Date  . A-fib ([place])   . Anemia 05/29/2019  . CHF (congestive heart failure) ([place])   . Chronic respiratory failure ([place])   . Diabetes mellitus without complication ([place])   . Elevated troponin 02/06/2019  . Hypertension   . Obesity   . Pulmonary embolism ([place])   . Rectal bleeding 05/29/2019  . Sleep apnea     Current Outpatient Medications  Medication Sig Dispense Refill  . albuterol (VENTOLIN HFA) 108 (90 Base) MCG/ACT inhaler Inhale 2 puffs into the lungs every 6 (six)  hours as needed for wheezing or shortness of breath. 18 g 0  . amLODipine (NORVASC) 10 MG tablet Take 1 tablet (10 mg total) by mouth daily. 90 tablet 1  . atorvastatin (LIPITOR) 20 MG tablet Take 1 tablet (20 mg total) by mouth at bedtime. 90 tablet 2  . Blood Glucose Monitoring Suppl (TRUE METRIX METER) w/Device KIT Use as instructed. Check blood glucose level by fingerstick twice per day. 1 kit 0  . carboxymethylcellulose (REFRESH PLUS) 0.5 % SOLN Place 1 drop into both eyes daily.     Marland Kitchen docusate sodium (COLACE) 100 MG capsule Take 1 capsule (100 mg total) by mouth 2 (two) times daily. 180 capsule 1  . ferrous sulfate 325 (65 FE) MG tablet Take 1 tablet (325 mg total) by mouth daily. Patient will pick up scripts today. 90 tablet 0  . glucose blood (TRUE METRIX BLOOD GLUCOSE TEST) test strip Use as instructed. Check blood glucose level by fingerstick twice per day. 100 each 12  . hydrocortisone (ANUSOL-HC) 25 MG suppository Place 1 suppository (25 mg total) rectally 2 (two) times daily. 36 suppository 1  . labetalol (NORMODYNE) 200 MG tablet Take 1 tablet (200 mg total) by mouth 2 (two) times daily. 120 tablet 0  . levalbuterol (XOPENEX HFA) 45 MCG/ACT inhaler Inhale 2 puffs into the lungs every 4 (four) hours as needed for wheezing.     . metFORMIN (GLUCOPHAGE) 500 MG tablet Take 1 tablet (500 mg total) by mouth 2 (two) times daily with a meal. Patient will pick up scripts today. 180 tablet 1  . Misc. Devices MISC Please provide [name] with insurance approved portable O2 concentrator ICD 10 J96.11 Z99.81 1 each 0  . Misc. Devices MISC Requires O2 @ 3L/min continuously via nasal canula and home fill system,. 1 each 0  . omeprazole (PRILOSEC) 20 MG capsule Take 1 capsule (20 mg total) by mouth daily. 30 capsule 3  . polyethylene glycol (MIRALAX / GLYCOLAX) 17 g packet Take 17 g by mouth daily as needed for severe constipation. 30 each 3  . rivaroxaban (XARELTO) 20 MG TABS tablet Take 1 tablet  (20 mg total) by mouth daily with supper. 60 tablet 0  . torsemide (DEMADEX) 20 MG tablet Take 4 tablets (80 mg total) by mouth 2 (two) times daily. 240 tablet 6  . TRUEplus Lancets 28G MISC Use as instructed. Check blood glucose level by fingerstick twice per day. 200 each 3  . potassium chloride SA (K-DUR) 20 MEQ tablet Take 2 tablets (40 mEq total) by mouth 2 (two) times daily. 360 tablet 3   No current facility-administered medications for this encounter.  Vitals:   07/09/19 1006  BP: (!) 160/103  Pulse: 92  SpO2: 98%  Weight: (!) 185.9 kg (409 lb 12.8 oz)   Wt Readings from Last 3 Encounters:  07/09/19 (!) 185.9 kg (409 lb 12.8 oz)  07/04/19 (!) 185.8 kg (409 lb 9.6 oz)  07/02/19 (!) 180.5 kg (398 lb)    PHYSICAL EXAM: General:  Walked slowly in the clinic with oxygen. SOB with exertion.  HEENT: normal Neck: supple. JVP 10-11. Carotids 2+ bilat; no bruits. No lymphadenopathy or thryomegaly appreciated. Cor: PMI nondisplaced. Regular rate & rhythm. No rubs, gallops or murmurs. Lungs: clear Abdomen: obese, soft, nontender, nondistended. No hepatosplenomegaly. No bruits or masses. Good bowel sounds. Extremities: no cyanosis, clubbing, rash, R and LLE trace-1+ edema Neuro: alert & orientedx3, cranial nerves grossly intact. moves all 4 extremities w/o difficulty. Affect pleasant .  ASSESSMENT & PLAN: 1. Acute/Chronic diastolic CHF: Echo in 6/01 showed EF 60-65% but RV was poorly visualized. He was initially diuresed with Lasix gtt 10 mg/hr and weight dropped 10 lbs. However, creatinine increased from 1.24 =>2.38.  I suspect significant RV dysfunction in setting of OSA, unfortunately unable to see the RV well on last echo. Rice on 7/29 showed elevated left and right heart filling pressures but not marked elevation.   NYHA IIIb. - Volume status elevated in the setting of high sodium diet and increased fluid intake.  - Give 80 mg IV lasix + 40 meq potassium in the clinic. > 1300  urine output after IV lasix.  - Increase torsemide to 100 mg twice a day.  - Check BMET and BNP today.  2. CKD Stage II.   Check BMET today.  3. Atrial fibrillation: Paroxysmal.   - Regular pulse. No bleeding.  - Continue Xarelto. May need to switch to eliquis.  4. OSA: Continue CPAP nightly.  5. Obesity: Body mass index is 57.16 kg/m.  Discussed portion control.    6. H/o PE: 5/20.  Continue Xarelto.   7. HTN: Elevated.  8. Hematochezia   Had scope in June with internal hemorrhoids. Continue stool softener. Continue anusol HC as needed per GI recommendations after scope.  - No further bleeding.   Excellent response to IV lasix. Follow up in 2 weeks to reassess volume status. Continue HF Paramedicine.    Cody Oliger NP-C  10:14 AM

## 2019-07-09 NOTE — Progress Notes (Signed)
Pt given 80mg  iv lasix with 33meq of K. Pt tolerated it well. Pt voided 1353mls during his visit. IV removed. Clean dry and intact. Pt released with an additional urinal to take home.

## 2019-07-09 NOTE — Progress Notes (Signed)
Came out today post clinic visit for med rec. meds verified and pill box refilled with the 100mg  BID.  I will come back out in the morning to swap out his 02 bottles again.   [name], EMT-Paramedic  07/09/19

## 2019-07-09 NOTE — Patient Instructions (Signed)
Lab work done today. We will notify you of any abnormal lab work. No news is good news!  You received 80mg  of IV Furosemide and 59meq of Potassium at your visit today.  INCREASE Torsemide to 100mg  tab two times daily.  Please follow up with the [facility] in 2 weeks.  At the [facility], you and your health needs are our priority. As part of our continuing mission to provide you with exceptional heart care, we have created designated Provider Care Teams. These Care Teams include your primary Cardiologist (physician) and Advanced Practice Providers (APPs- Physician Assistants and Nurse Practitioners) who all work together to provide you with the care you need, when you need it.   You may see any of the following providers on your designated Care Team at your next follow up: Marland Kitchen Dr Glori Bickers . Dr Loralie Champagne . Darrick Grinder, NP . Lyda Jester, PA   Please be sure to bring in all your medications bottles to every appointment.

## 2019-07-09 NOTE — Progress Notes (Signed)
Paramedicine Encounter    Patient ID: Benjamin [name], male    DOB: 01/15/1966, 53 y.o.   MRN: [id number]   Patient Care Team: [name], NP as PCP - General (Nurse Practitioner) [name], MD as PCP - Cardiology (Cardiology) [name], LCSW as Social Worker (Licensed Clinical Social Worker)  Patient Active Problem List   Diagnosis Date Noted  . BRBPR (bright red blood per rectum) 06/12/2019  . Hypertension   . CHF (congestive heart failure) ([place])   . Sleep apnea   . Chronic respiratory failure ([place])   . Lower GI bleeding 06/11/2019  . Hypokalemia 05/29/2019  . GI bleed 05/29/2019  . Anemia 05/28/2019  . Acute on chronic diastolic CHF (congestive heart failure) ([place]) 04/11/2019  . Iron deficiency anemia 03/11/2019  . OSA on CPAP 03/11/2019  . HLD (hyperlipidemia) 03/11/2019  . Elevated troponin 03/11/2019  . GERD (gastroesophageal reflux disease) 03/11/2019  . Rectal bleeding 02/20/2019  . Dyspnea 02/06/2019  . COVID-19 virus infection 02/06/2019  . Bilateral lower extremity edema   . Morbid obesity with BMI of 50.0-59.9, adult ([name])   . Chronic diastolic CHF (congestive heart failure) ([place])   . PAF (paroxysmal atrial fibrillation) ([place])   . PE (pulmonary thromboembolism) ([place]) 01/21/2019  . Hypertensive urgency 01/21/2019  . Diabetes mellitus type 2 in obese ([name]) 01/21/2019    Current Outpatient Medications:  .  albuterol (VENTOLIN HFA) 108 (90 Base) MCG/ACT inhaler, Inhale 2 puffs into the lungs every 6 (six) hours as needed for wheezing or shortness of breath., Disp: 18 g, Rfl: 0 .  amLODipine (NORVASC) 10 MG tablet, Take 1 tablet (10 mg total) by mouth daily., Disp: 90 tablet, Rfl: 1 .  atorvastatin (LIPITOR) 20 MG tablet, Take 1 tablet (20 mg total) by mouth at bedtime., Disp: 90 tablet, Rfl: 2 .  Blood Glucose Monitoring Suppl (TRUE METRIX METER) w/Device KIT, Use as instructed. Check blood glucose level by fingerstick twice per day., Disp: 1 kit,  Rfl: 0 .  carboxymethylcellulose (REFRESH PLUS) 0.5 % SOLN, Place 1 drop into both eyes daily. , Disp: , Rfl:  .  docusate sodium (COLACE) 100 MG capsule, Take 1 capsule (100 mg total) by mouth 2 (two) times daily., Disp: 180 capsule, Rfl: 1 .  ferrous sulfate 325 (65 FE) MG tablet, Take 1 tablet (325 mg total) by mouth daily. Patient will pick up scripts today., Disp: 90 tablet, Rfl: 0 .  glucose blood (TRUE METRIX BLOOD GLUCOSE TEST) test strip, Use as instructed. Check blood glucose level by fingerstick twice per day., Disp: 100 each, Rfl: 12 .  hydrocortisone (ANUSOL-HC) 25 MG suppository, Place 1 suppository (25 mg total) rectally 2 (two) times daily., Disp: 36 suppository, Rfl: 1 .  labetalol (NORMODYNE) 200 MG tablet, Take 1 tablet (200 mg total) by mouth 2 (two) times daily., Disp: 120 tablet, Rfl: 0 .  levalbuterol (XOPENEX HFA) 45 MCG/ACT inhaler, Inhale 2 puffs into the lungs every 4 (four) hours as needed for wheezing. , Disp: , Rfl:  .  metFORMIN (GLUCOPHAGE) 500 MG tablet, Take 1 tablet (500 mg total) by mouth 2 (two) times daily with a meal. Patient will pick up scripts today., Disp: 180 tablet, Rfl: 1 .  Misc. Devices MISC, Please provide Mr. Illes with insurance approved portable O2 concentrator ICD 10 J96.11 Z99.81, Disp: 1 each, Rfl: 0 .  Misc. Devices MISC, Requires O2 @ 3L/min continuously via nasal canula and home fill system,., Disp: 1 each, Rfl: 0 .  omeprazole (PRILOSEC) 20 MG capsule, Take 1 capsule (20 mg total) by mouth daily., Disp: 30 capsule, Rfl: 3 .  polyethylene glycol (MIRALAX / GLYCOLAX) 17 g packet, Take 17 g by mouth daily as needed for severe constipation., Disp: 30 each, Rfl: 3 .  potassium chloride SA (K-DUR) 20 MEQ tablet, Take 2 tablets (40 mEq total) by mouth 2 (two) times daily., Disp: 360 tablet, Rfl: 3 .  rivaroxaban (XARELTO) 20 MG TABS tablet, Take 1 tablet (20 mg total) by mouth daily with supper., Disp: 60 tablet, Rfl: 0 .  torsemide (DEMADEX) 100  MG tablet, Take 1 tablet (100 mg total) by mouth 2 (two) times daily., Disp: 180 tablet, Rfl: 3 .  TRUEplus Lancets 28G MISC, Use as instructed. Check blood glucose level by fingerstick twice per day., Disp: 200 each, Rfl: 3 No Known Allergies    Social History   Socioeconomic History  . Marital status: Married    Spouse name: Not on file  . Number of children: Not on file  . Years of education: Not on file  . Highest education level: Not on file  Occupational History  . Not on file  Social Needs  . Financial resource strain: Not on file  . Food insecurity    Worry: Not on file    Inability: Not on file  . Transportation needs    Medical: Not on file    Non-medical: Not on file  Tobacco Use  . Smoking status: Former Research scientist (life sciences)  . Smokeless tobacco: Never Used  Substance and Sexual Activity  . Alcohol use: Not Currently  . Drug use: Not Currently  . Sexual activity: Not Currently  Lifestyle  . Physical activity    Days per week: Not on file    Minutes per session: Not on file  . Stress: Not on file  Relationships  . Social Herbalist on phone: Not on file    Gets together: Not on file    Attends religious service: Not on file    Active member of club or organization: Not on file    Attends meetings of clubs or organizations: Not on file    Relationship status: Not on file  . Intimate partner violence    Fear of current or ex partner: Not on file    Emotionally abused: Not on file    Physically abused: Not on file    Forced sexual activity: Not on file  Other Topics Concern  . Not on file  Social History Narrative  . Not on file    Physical Exam      Future Appointments  Date Time Provider [place]  07/26/2019 10:30 AM [facility] PA/NP [facility] None  08/05/2019 11:30 AM [facility] PA/NP [facility] None  08/19/2019 12:00 PM [name], MD [facility] None  09/30/2019  1:30 PM [name], NP [facility] None    BP (!) 150/0   Pulse 92   Resp  20   Wt (!) 414 lb (187.8 kg)   SpO2 97%   BMI 57.74 kg/m   Weight yesterday-414 Last visit weight-398  Spoke to pt this morning regarding time of visit today but he sounded very sob and like he didn't feel good. He said his leg was swollen and he couldn't breathe good.  When I arrived his left leg was swollen up to his knees and he said it was swelling up to his groin area and he was short of breath. The extra '20mg'$  for a  couple days had no effect on him.  I spoke to amy and she requested he come in for IV lasix in the clinic. I called jenna to get him a cab so that was done and sent him to clinic.  His torsemide was increased to '100mg'$  BID post visit.    [name], [facility] Paramedic  07/09/19

## 2019-07-09 NOTE — Telephone Encounter (Signed)
CSW met with pt while he was in clinic for IV lasix.    Pt signed additional paperwork for [employer]- CSW mailed to [facility] for review.  Pt also signed multiple housing applications- CSW turned in applications that could be turned in online and will follow up with housing agencies for appts to complete application in person.  CSW will continue to follow and assist as needed  [name], [facility] Desk#: [phone] Cell#: [phone]

## 2019-07-10 ENCOUNTER — Telehealth (HOSPITAL_COMMUNITY): Payer: Self-pay | Admitting: *Deleted

## 2019-07-10 ENCOUNTER — Telehealth: Payer: Self-pay

## 2019-07-10 ENCOUNTER — Other Ambulatory Visit (HOSPITAL_COMMUNITY): Payer: Self-pay

## 2019-07-10 NOTE — Telephone Encounter (Signed)
Previous goals: LONG TERM:  - Weight loss (300 lbs)  - "Get my breath back"  - "Get my life back, get on my own again"  - "Chill out"   SHORT TERM: Last week - Walk to help with managing his frustrations either to prevent frustration or to cope - Walk outside daily for 15 minutes (weather permitting and when he is allowed) to pick back up where he was before he had the bleeding - Report back next week about how much he did this and his affect on his frustration   Progress Note: He had a good week, in comparisons to the last few weeks he rated this week as turning the corner. He is not feeling as frustrated and is "relaxed" and feeling confident in finding somewhere to live. He is keeping busy, he does not want to sit around. He was able to take a daily walk 3 days in the last week as he was busy running to doctor's appointments. He is still walking up the stairs several times a day. Today, he felt exceptionally better than other days, he says he took a mile walk today and he felt to have a clearer mind. He is using his headphones when he walks, which he says is encouraging. He is still using his oxygen but does not use this oxygen consistently when exercising. He says he is using the oxygen 8/10 times of his exertion/exercise. He says his breathing is very related to his fluid retention. He had IV Lasix in the office yesterday and he noticed his breathing was great today. He was advised to keep an eye on his weight daily. His legs feel better as well, he is relating this to his edema as well.   Achievements: - He isn't bleeding anymore and feels good and today was a better day than he has had.  - His spirits were more lifted than last week.   Challenges/Frustrations: - He has been worried about the fluid on his body - He is still annoyed by the people around him in the facility   New Weekly Goal (s): - Walk to help with managing his frustrations either to prevent frustration or to  cope - Walk outside daily for 15 minutes (weather permitting and when he is allowed) to pick back up where he was before he had the bleeding - Report back next week about how much he did this and his affect on his frustration  - Add: One longer exercise duration outside (30 minutes) - Add: One extra trip upstairs   Patient rated his Weekly goals achievable as 7 on a 10-point scale.     Next Session:   One week from today: Wednesday 10/28    Landis Martins, MS, ACSM-RCEP Clinical Exercise Physiologist

## 2019-07-10 NOTE — Telephone Encounter (Signed)
Call placed to [employer] [facility] to check on status of POC order that was faxed on 07/01/2019. Spoke to [place] who stated that she would need to check the status of the order with the branch manager and call this CM back.

## 2019-07-10 NOTE — Progress Notes (Signed)
Came out this morning to take his 02 tanks to [facility] to swap out. I took 3 E tanks and then took them back to him. He said it should last him until next week.   [name], EMT-Paramedic  07/10/19

## 2019-07-10 NOTE — Telephone Encounter (Signed)
Message received from [place].  She said that per the O2 saturations submitted, the patient does not qualify for O2.  They would need new saturations to qualify him. Then they would process and order for the home fill system first prior to processing and order for a portable concentrator

## 2019-07-11 ENCOUNTER — Telehealth (HOSPITAL_COMMUNITY): Payer: Self-pay | Admitting: Licensed Clinical Social Worker

## 2019-07-11 NOTE — Telephone Encounter (Addendum)
CSW and pt called Medicaid together to get patient registered for Medicaid transport.  Medicaid representative will call him later this morning to get him signed up so he can use the transport service- CSW requested pt have all his appt information ready so he can sign up for rides to upcoming appts.  CSW also able to get pt appt for housing application with [place] for 10/27 at [address], submitted application for [place] apartments, and called [facility] apartments to set up appt- left VM  CSW will continue to follow and assist as needed  [name], [facility] Desk#: [phone] Cell#: [phone]

## 2019-07-12 ENCOUNTER — Telehealth (HOSPITAL_COMMUNITY): Payer: Self-pay | Admitting: Licensed Clinical Social Worker

## 2019-07-12 NOTE — Telephone Encounter (Signed)
CSW set up taxi to take patient to [facility] appt.  CSW also discussed with pt time sensitive need of ordering Social Security card- he states he is supposed to do this today with the case worker at [employer].  CSW encouraged him to call me if for whatever reason they were unable to help him today.  CSW will continue to follow and assist as needed  [name], [facility] Desk#: [phone] Cell#: [phone]

## 2019-07-13 NOTE — Telephone Encounter (Signed)
Sats were not 88% at rest. If those are their qualifications he will not qualify. He only desats with walking long distances.

## 2019-07-16 ENCOUNTER — Other Ambulatory Visit (HOSPITAL_COMMUNITY): Payer: Self-pay

## 2019-07-16 ENCOUNTER — Encounter (HOSPITAL_COMMUNITY): Payer: Self-pay

## 2019-07-16 ENCOUNTER — Telehealth (HOSPITAL_COMMUNITY): Payer: Self-pay | Admitting: Licensed Clinical Social Worker

## 2019-07-16 ENCOUNTER — Inpatient Hospital Stay (HOSPITAL_COMMUNITY)
Admission: EM | Admit: 2019-07-16 | Discharge: 2019-07-18 | DRG: 292 | Disposition: A | Discharge status: Home / Self Care | Payer: Medicaid Other | Attending: Family Medicine | Admitting: Family Medicine

## 2019-07-16 ENCOUNTER — Other Ambulatory Visit: Payer: Self-pay

## 2019-07-16 ENCOUNTER — Emergency Department (HOSPITAL_COMMUNITY): Payer: Medicaid Other

## 2019-07-16 DIAGNOSIS — Z833 Family history of diabetes mellitus: Secondary | ICD-10-CM | POA: Diagnosis not present

## 2019-07-16 DIAGNOSIS — I5033 Acute on chronic diastolic (congestive) heart failure: Secondary | ICD-10-CM | POA: Diagnosis not present

## 2019-07-16 DIAGNOSIS — K219 Gastro-esophageal reflux disease without esophagitis: Secondary | ICD-10-CM | POA: Diagnosis not present

## 2019-07-16 DIAGNOSIS — J9610 Chronic respiratory failure, unspecified whether with hypoxia or hypercapnia: Secondary | ICD-10-CM | POA: Diagnosis present

## 2019-07-16 DIAGNOSIS — I2699 Other pulmonary embolism without acute cor pulmonale: Secondary | ICD-10-CM | POA: Diagnosis present

## 2019-07-16 DIAGNOSIS — G4733 Obstructive sleep apnea (adult) (pediatric): Secondary | ICD-10-CM | POA: Diagnosis not present

## 2019-07-16 DIAGNOSIS — I161 Hypertensive emergency: Secondary | ICD-10-CM | POA: Diagnosis not present

## 2019-07-16 DIAGNOSIS — D500 Iron deficiency anemia secondary to blood loss (chronic): Secondary | ICD-10-CM | POA: Diagnosis present

## 2019-07-16 DIAGNOSIS — Z8619 Personal history of other infectious and parasitic diseases: Secondary | ICD-10-CM | POA: Diagnosis not present

## 2019-07-16 DIAGNOSIS — Z86711 Personal history of pulmonary embolism: Secondary | ICD-10-CM | POA: Diagnosis not present

## 2019-07-16 DIAGNOSIS — I110 Hypertensive heart disease with heart failure: Principal | ICD-10-CM | POA: Diagnosis present

## 2019-07-16 DIAGNOSIS — Z6841 Body Mass Index (BMI) 40.0 and over, adult: Secondary | ICD-10-CM

## 2019-07-16 DIAGNOSIS — Z20828 Contact with and (suspected) exposure to other viral communicable diseases: Secondary | ICD-10-CM | POA: Diagnosis present

## 2019-07-16 DIAGNOSIS — E662 Morbid (severe) obesity with alveolar hypoventilation: Secondary | ICD-10-CM | POA: Diagnosis present

## 2019-07-16 DIAGNOSIS — Z79899 Other long term (current) drug therapy: Secondary | ICD-10-CM

## 2019-07-16 DIAGNOSIS — Z7901 Long term (current) use of anticoagulants: Secondary | ICD-10-CM

## 2019-07-16 DIAGNOSIS — R0602 Shortness of breath: Secondary | ICD-10-CM | POA: Diagnosis not present

## 2019-07-16 DIAGNOSIS — I480 Paroxysmal atrial fibrillation: Secondary | ICD-10-CM | POA: Diagnosis present

## 2019-07-16 DIAGNOSIS — R609 Edema, unspecified: Secondary | ICD-10-CM | POA: Diagnosis not present

## 2019-07-16 DIAGNOSIS — Z9989 Dependence on other enabling machines and devices: Secondary | ICD-10-CM

## 2019-07-16 DIAGNOSIS — D649 Anemia, unspecified: Secondary | ICD-10-CM | POA: Diagnosis not present

## 2019-07-16 DIAGNOSIS — I5082 Biventricular heart failure: Secondary | ICD-10-CM | POA: Diagnosis present

## 2019-07-16 DIAGNOSIS — K649 Unspecified hemorrhoids: Secondary | ICD-10-CM | POA: Diagnosis present

## 2019-07-16 DIAGNOSIS — Z9114 Patient's other noncompliance with medication regimen: Secondary | ICD-10-CM | POA: Diagnosis not present

## 2019-07-16 DIAGNOSIS — Z7984 Long term (current) use of oral hypoglycemic drugs: Secondary | ICD-10-CM | POA: Diagnosis not present

## 2019-07-16 DIAGNOSIS — Z87891 Personal history of nicotine dependence: Secondary | ICD-10-CM | POA: Diagnosis not present

## 2019-07-16 DIAGNOSIS — I160 Hypertensive urgency: Secondary | ICD-10-CM

## 2019-07-16 DIAGNOSIS — E119 Type 2 diabetes mellitus without complications: Secondary | ICD-10-CM

## 2019-07-16 DIAGNOSIS — I10 Essential (primary) hypertension: Secondary | ICD-10-CM | POA: Diagnosis not present

## 2019-07-16 DIAGNOSIS — I509 Heart failure, unspecified: Secondary | ICD-10-CM | POA: Diagnosis not present

## 2019-07-16 DIAGNOSIS — D638 Anemia in other chronic diseases classified elsewhere: Secondary | ICD-10-CM | POA: Diagnosis present

## 2019-07-16 DIAGNOSIS — R0603 Acute respiratory distress: Secondary | ICD-10-CM | POA: Diagnosis present

## 2019-07-16 DIAGNOSIS — Z9119 Patient's noncompliance with other medical treatment and regimen: Secondary | ICD-10-CM

## 2019-07-16 DIAGNOSIS — E785 Hyperlipidemia, unspecified: Secondary | ICD-10-CM | POA: Diagnosis present

## 2019-07-16 DIAGNOSIS — Z8249 Family history of ischemic heart disease and other diseases of the circulatory system: Secondary | ICD-10-CM

## 2019-07-16 LAB — TROPONIN I (HIGH SENSITIVITY): Troponin I (High Sensitivity): 20 ng/L — ABNORMAL HIGH (ref <18)

## 2019-07-16 LAB — CBC
HCT: 29.9 % — ABNORMAL LOW (ref 39.0–52.0)
Hemoglobin: 8.6 g/dL — ABNORMAL LOW (ref 13.0–17.0)
MCH: 21.8 pg — ABNORMAL LOW (ref 26.0–34.0)
MCHC: 28.8 g/dL — ABNORMAL LOW (ref 30.0–36.0)
MCV: 75.9 fL — ABNORMAL LOW (ref 80.0–100.0)
Platelets: 343 K/uL (ref 150–400)
RBC: 3.94 MIL/uL — ABNORMAL LOW (ref 4.22–5.81)
RDW: 20.1 % — ABNORMAL HIGH (ref 11.5–15.5)
WBC: 9.9 K/uL (ref 4.0–10.5)
nRBC: 0 % (ref 0.0–0.2)

## 2019-07-16 LAB — BASIC METABOLIC PANEL
Anion gap: 9 (ref 5–15)
BUN: 9 mg/dL (ref 6–20)
CO2: 31 mmol/L (ref 22–32)
Calcium: 9.2 mg/dL (ref 8.9–10.3)
Chloride: 99 mmol/L (ref 98–111)
Creatinine, Ser: 1.21 mg/dL (ref 0.61–1.24)
GFR calc Af Amer: >60 mL/min (ref >60)
GFR calc non Af Amer: >60 mL/min (ref >60)
Glucose, Bld: 114 mg/dL — ABNORMAL HIGH (ref 70–99)
Potassium: 3.7 mmol/L (ref 3.5–5.1)
Sodium: 139 mmol/L (ref 135–145)

## 2019-07-16 LAB — BRAIN NATRIURETIC PEPTIDE: B Natriuretic Peptide: 29.4 pg/mL (ref 0.0–100.0)

## 2019-07-16 LAB — SARS CORONAVIRUS 2 (TAT 6-24 HRS): SARS Coronavirus 2: NEGATIVE

## 2019-07-16 LAB — MAGNESIUM: Magnesium: 1.8 mg/dL (ref 1.7–2.4)

## 2019-07-16 MED ORDER — FERROUS SULFATE 325 (65 FE) MG PO TABS
325.0000 mg | ORAL_TABLET | Freq: Every day | ORAL | Status: DC
  Administered 2019-07-17 – 2019-07-18 (×2): 325 mg via ORAL
  Filled 2019-07-16 (×2): qty 1

## 2019-07-16 MED ORDER — CARBOXYMETHYLCELLULOSE SODIUM 0.5 % OP SOLN: 1.0000 [drp] | Freq: Every day | OPHTHALMIC | Status: DC

## 2019-07-16 MED ORDER — POLYETHYLENE GLYCOL 3350 17 G PO PACK
17.0000 g | PACK | Freq: Every day | ORAL | Status: DC | PRN
  Administered 2019-07-17: 13:00:00 17 g via ORAL
  Filled 2019-07-16: qty 1

## 2019-07-16 MED ORDER — RIVAROXABAN 20 MG PO TABS
20.0000 mg | ORAL_TABLET | Freq: Every day | ORAL | Status: DC
  Administered 2019-07-17 (×2): 20 mg via ORAL
  Filled 2019-07-16 (×2): qty 1

## 2019-07-16 MED ORDER — LISINOPRIL 5 MG PO TABS
5.0000 mg | ORAL_TABLET | Freq: Every day | ORAL | Status: DC
  Administered 2019-07-17 – 2019-07-18 (×2): 5 mg via ORAL
  Filled 2019-07-16 (×2): qty 1

## 2019-07-16 MED ORDER — AMLODIPINE BESYLATE 10 MG PO TABS
10.0000 mg | ORAL_TABLET | Freq: Every day | ORAL | Status: DC
  Administered 2019-07-17 – 2019-07-18 (×2): 10 mg via ORAL
  Filled 2019-07-16 (×2): qty 1

## 2019-07-16 MED ORDER — ATORVASTATIN CALCIUM 10 MG PO TABS
20.0000 mg | ORAL_TABLET | Freq: Every day | ORAL | Status: DC
  Administered 2019-07-16 – 2019-07-17 (×2): 20 mg via ORAL
  Filled 2019-07-16 (×2): qty 2

## 2019-07-16 MED ORDER — FUROSEMIDE 10 MG/ML IJ SOLN
100.0000 mg | Freq: Two times a day (BID) | INTRAVENOUS | Status: DC
  Administered 2019-07-17: 100 mg via INTRAVENOUS
  Filled 2019-07-16 (×2): qty 10

## 2019-07-16 MED ORDER — ALBUTEROL SULFATE HFA 108 (90 BASE) MCG/ACT IN AERS
2.0000 | INHALATION_SPRAY | RESPIRATORY_TRACT | Status: DC | PRN
  Filled 2019-07-16: qty 6.7

## 2019-07-16 MED ORDER — NITROGLYCERIN 2 % TD OINT
1.0000 [in_us] | TOPICAL_OINTMENT | Freq: Four times a day (QID) | TRANSDERMAL | Status: DC
  Administered 2019-07-16 – 2019-07-17 (×3): 1 [in_us] via TOPICAL
  Filled 2019-07-16: qty 1
  Filled 2019-07-16: qty 30
  Filled 2019-07-16: qty 1

## 2019-07-16 MED ORDER — LABETALOL HCL 200 MG PO TABS
200.0000 mg | ORAL_TABLET | Freq: Two times a day (BID) | ORAL | Status: DC
  Administered 2019-07-16 – 2019-07-18 (×4): 200 mg via ORAL
  Filled 2019-07-16 (×4): qty 1

## 2019-07-16 MED ORDER — INSULIN ASPART 100 UNIT/ML ~~LOC~~ SOLN
0.0000 [IU] | Freq: Three times a day (TID) | SUBCUTANEOUS | Status: DC
  Administered 2019-07-17 – 2019-07-18 (×3): 1 [IU] via SUBCUTANEOUS

## 2019-07-16 MED ORDER — DOCUSATE SODIUM 100 MG PO CAPS: 100.0000 mg | ORAL_CAPSULE | Freq: Two times a day (BID) | ORAL | Status: DC | PRN

## 2019-07-16 MED ORDER — FUROSEMIDE 10 MG/ML IJ SOLN
100.0000 mg | Freq: Once | INTRAVENOUS | Status: AC
  Administered 2019-07-16: 100 mg via INTRAVENOUS
  Filled 2019-07-16: qty 10

## 2019-07-16 MED ORDER — SODIUM CHLORIDE 0.9 % IV SOLN: 250.0000 mL | INTRAVENOUS | Status: DC | PRN

## 2019-07-16 MED ORDER — PANTOPRAZOLE SODIUM 40 MG PO TBEC
40.0000 mg | DELAYED_RELEASE_TABLET | Freq: Every day | ORAL | Status: DC
  Administered 2019-07-17 – 2019-07-18 (×2): 40 mg via ORAL
  Filled 2019-07-16 (×2): qty 1

## 2019-07-16 MED ORDER — SODIUM CHLORIDE 0.9% FLUSH: 3.0000 mL | INTRAVENOUS | Status: DC | PRN

## 2019-07-16 MED ORDER — NITROGLYCERIN IN D5W 200-5 MCG/ML-% IV SOLN
5.0000 ug/min | INTRAVENOUS | Status: DC
  Administered 2019-07-16: 21:00:00 5 ug/min via INTRAVENOUS
  Filled 2019-07-16 (×2): qty 250

## 2019-07-16 MED ORDER — MAGNESIUM SULFATE IN D5W 1-5 GM/100ML-% IV SOLN
1.0000 g | Freq: Once | INTRAVENOUS | Status: AC
  Administered 2019-07-17: 03:00:00 1 g via INTRAVENOUS
  Filled 2019-07-16 (×2): qty 100

## 2019-07-16 MED ORDER — POTASSIUM CHLORIDE CRYS ER 20 MEQ PO TBCR
20.0000 meq | EXTENDED_RELEASE_TABLET | Freq: Once | ORAL | Status: AC
  Administered 2019-07-16: 20 meq via ORAL
  Filled 2019-07-16: qty 1

## 2019-07-16 MED ORDER — INSULIN ASPART 100 UNIT/ML ~~LOC~~ SOLN: 0.0000 [IU] | Freq: Every day | SUBCUTANEOUS | Status: DC

## 2019-07-16 MED ORDER — ALBUTEROL SULFATE HFA 108 (90 BASE) MCG/ACT IN AERS
2.0000 | INHALATION_SPRAY | RESPIRATORY_TRACT | Status: DC
  Administered 2019-07-16: 21:00:00 2 via RESPIRATORY_TRACT
  Filled 2019-07-16: qty 6.7

## 2019-07-16 MED ORDER — ACETAMINOPHEN 325 MG PO TABS: 650.0000 mg | ORAL_TABLET | ORAL | Status: DC | PRN

## 2019-07-16 MED ORDER — SODIUM CHLORIDE 0.9% FLUSH
3.0000 mL | Freq: Two times a day (BID) | INTRAVENOUS | Status: DC
  Administered 2019-07-17 (×3): 3 mL via INTRAVENOUS

## 2019-07-16 MED ORDER — ONDANSETRON HCL 4 MG/2ML IJ SOLN: 4.0000 mg | Freq: Four times a day (QID) | INTRAMUSCULAR | Status: DC | PRN

## 2019-07-16 NOTE — ED Triage Notes (Signed)
Pt bib [facility] with SOB and chest pain. Pt has hx of CHF and has had a 15 lb weight gain over the last week. Pt wears 3L at baseline, 97% on 3 lpm with EMS.

## 2019-07-16 NOTE — H&P (Signed)
History and Physical    Benjamin [name] DCV:[id number] DOB: 17-Feb-1966 DOA: 07/16/2019  Referring MD/NP/PA:   PCP: Benjamin Pounds, NP   Patient coming from:  The patient is coming from home.  At baseline, pt is independent for most of ADL.        Chief Complaint: SOB  HPI: Benjamin [name] is a 53 y.o. male with medical history significant of hypertension, hyperlipidemia, diabetes mellitus, chronic respiratory failure on 3 L oxygen, GERD, OSA on CPAP, morbid obesity, rectal bleeding, atrial fibrillation and PE on Xarelto, COVID-19 infection (May 2020), iron deficiency anemia, who presents with shortness of breath.  Patient states that he has been having shortness of breath for more than 1 week, which has worsened since yesterday.  He has mild dry cough, no fever or chills.  He has chest tightness, but no chest pain currently.  He has gained 15 [name] in the last week.  No nausea, vomiting, diarrhea, abdomen pain, symptoms for UTI or unilateral weakness.  ED Course: pt was found to have pending COVID-19 test, creatinine 1.21, BUN 9, GFR>60, temperature normal, elevated blood pressure 247/128, heart rate 80s to 90s, oxygen saturation 97% on home 3 L oxygen, tachypnea, chest x-ray with cardiomegaly without infiltration.  Patient is admitted to stepdown as inpatient.  Review of Systems:   General: no fevers, chills, has body weight gain, has fatigue HEENT: no blurry vision, hearing changes or sore throat Respiratory: no dyspnea, coughing, wheezing CV: has chest tightness, no palpitations GI: no nausea, vomiting, abdominal pain, diarrhea, constipation GU: no dysuria, burning on urination, increased urinary frequency, hematuria  Ext: has leg edema Neuro: no unilateral weakness, numbness, or tingling, no vision change or hearing loss Skin: no rash, no skin tear. MSK: No muscle spasm, no deformity, no limitation of range of movement in spin Heme: No easy bruising.  Travel history: No  recent long distant travel.  Allergy: No Known Allergies  Past Medical History:  Diagnosis Date  . A-fib ([place])   . Anemia 05/29/2019  . CHF (congestive heart failure) ([place])   . Chronic respiratory failure ([place])   . Diabetes mellitus without complication ([place])   . Elevated troponin 02/06/2019  . Hypertension   . Obesity   . Pulmonary embolism ([place])   . Rectal bleeding 05/29/2019  . Sleep apnea     Past Surgical History:  Procedure Laterality Date  . COLONOSCOPY WITH PROPOFOL Left 02/25/2019   Procedure: COLONOSCOPY WITH PROPOFOL;  Surgeon: Benjamin Silence, MD;  Location: [facility];  Service: Endoscopy;  Laterality: Left;  . ESOPHAGOGASTRODUODENOSCOPY (EGD) WITH PROPOFOL Left 02/25/2019   Procedure: ESOPHAGOGASTRODUODENOSCOPY (EGD) WITH PROPOFOL;  Surgeon: Benjamin Silence, MD;  Location[facility] ENDOSCOPY;  Service: Endoscopy;  Laterality: Left;  . ESOPHAGOGASTRODUODENOSCOPY (EGD) WITH PROPOFOL N/A 06/12/2019   Procedure: ESOPHAGOGASTRODUODENOSCOPY (EGD) WITH PROPOFOL;  Surgeon: Benjamin Essex, MD;  Location: [facility];  Service: Gastroenterology;  Laterality: N/A;  . FLEXIBLE SIGMOIDOSCOPY N/A 06/12/2019   Procedure: FLEXIBLE SIGMOIDOSCOPY;  Surgeon: Benjamin Essex, MD;  Location: [place];  Service: Gastroenterology;  Laterality: N/A;  . NO PAST SURGERIES    . RIGHT HEART CATH N/A 04/17/2019   Procedure: RIGHT HEART CATH;  Surgeon: Benjamin Dresser, MD;  Location: [facility];  Service: Cardiovascular;  Laterality: N/A;    Social History:  reports that he has quit smoking. He has never used smokeless tobacco. He reports previous alcohol use. He reports previous drug use.  Family History:  Family History  Problem Relation  Age of Onset  . Hypertension Mother   . Diabetes Mother      Prior to Admission medications   Medication Sig Start Date End Date Taking? Authorizing Provider  albuterol (VENTOLIN HFA) 108 (90 Base) MCG/ACT inhaler Inhale 2 puffs into the lungs every 6 (six)  hours as needed for wheezing or shortness of breath. 06/24/19  Yes Benjamin Pounds, NP  amLODipine (NORVASC) 10 MG tablet Take 1 tablet (10 mg total) by mouth daily. 06/04/19 09/02/19 Yes Benjamin Pounds, NP  atorvastatin (LIPITOR) 20 MG tablet Take 1 tablet (20 mg total) by mouth at bedtime. 06/04/19 09/02/19 Yes Benjamin Pounds, NP  Blood Glucose Monitoring Suppl (TRUE METRIX METER) w/Device KIT Use as instructed. Check blood glucose level by fingerstick twice per day. 03/18/19  Yes Benjamin Pounds, NP  carboxymethylcellulose (REFRESH PLUS) 0.5 % SOLN Place 1 drop into both eyes daily.    Yes [provider]  docusate sodium (COLACE) 100 MG capsule Take 1 capsule (100 mg total) by mouth 2 (two) times daily. 06/28/19 09/26/19 Yes Benjamin Pounds, NP  ferrous sulfate 325 (65 FE) MG tablet Take 1 tablet (325 mg total) by mouth daily. Patient will pick up scripts today. 06/24/19 09/22/19 Yes Benjamin Pounds, NP  glucose blood (TRUE METRIX BLOOD GLUCOSE TEST) test strip Use as instructed. Check blood glucose level by fingerstick twice per day. 03/18/19  Yes Benjamin Pounds, NP  labetalol (NORMODYNE) 200 MG tablet Take 1 tablet (200 mg total) by mouth 2 (two) times daily. 04/19/19  Yes [name], Benjamin N, DO  levalbuterol [facility] HFA) 45 MCG/ACT inhaler Inhale 2 puffs into the lungs every 4 (four) hours as needed for wheezing.    Yes [provider]  metFORMIN (GLUCOPHAGE) 500 MG tablet Take 1 tablet (500 mg total) by mouth 2 (two) times daily with a meal. Patient will pick up scripts today. 06/28/19 09/26/19 Yes Benjamin Pounds, NP  Misc. Devices MISC Please provide Mr. Towery with insurance approved portable O2 concentrator ICD 10 J96.11 Z99.81 06/30/19  Yes Benjamin Pounds, NP  Misc. Devices MISC Requires O2 @ 3L/min continuously via nasal canula and home fill system,. 07/03/19  Yes Benjamin Pounds, NP  omeprazole (PRILOSEC) 20 MG capsule Take 1 capsule (20 mg total) by mouth daily. 05/19/19   Yes Benjamin Pounds, NP  polyethylene glycol (MIRALAX / GLYCOLAX) 17 g packet Take 17 g by mouth daily as needed for severe constipation. 06/24/19 07/24/19 Yes Benjamin Pounds, NP  potassium chloride SA (K-DUR) 20 MEQ tablet Take 2 tablets (40 mEq total) by mouth 2 (two) times daily. 06/05/19 07/16/19 Yes Clegg, Amy D, NP  rivaroxaban (XARELTO) 20 MG TABS tablet Take 1 tablet (20 mg total) by mouth daily with supper. 04/19/19  Yes Kayleen Memos, DO  torsemide (DEMADEX) 100 MG tablet Take 1 tablet (100 mg total) by mouth 2 (two) times daily. 07/09/19  Yes Clegg, Amy D, NP  TRUEplus Lancets 28G MISC Use as instructed. Check blood glucose level by fingerstick twice per day. 03/18/19  Yes Benjamin Pounds, NP  hydrocortisone (ANUSOL-HC) 25 MG suppository Place 1 suppository (25 mg total) rectally 2 (two) times daily. Patient not taking: Reported on 07/16/2019 06/04/19   Benjamin Pounds, NP    Physical Exam: Vitals:   07/16/19 2045 07/16/19 2115 07/16/19 2130 07/16/19 2200  BP: (!) 196/109 (!) 194/111 (!) 185/103 (!) 183/102  Pulse:      Resp:  16 Marland Kitchen)  21 13  Temp:      TempSrc:      SpO2:      Height:       General: Not in acute distress HEENT:       Eyes: PERRL, EOMI, no scleral icterus.       ENT: No discharge from the ears and nose, no pharynx injection, no tonsillar enlargement.        Neck: Difficult to assess JVD due to morbid obesity, no bruit, no mass felt. Heme: No neck lymph node enlargement. Cardiac: S1/S2, RRR, No murmurs, No gallops or rubs. Respiratory: has fine crackle in base GI: Soft, nondistended, nontender, no rebound pain, no organomegaly, BS present. GU: No hematuria Ext: 3+ pitting leg edema bilaterally. 2+DP/PT pulse bilaterally. Musculoskeletal: No joint deformities, No joint redness or warmth, no limitation of ROM in spin. Skin: No rashes.  Neuro: Alert, oriented X3, cranial nerves II-XII grossly intact, moves all extremities normally.  Psych: Patient is not  psychotic, no suicidal or hemocidal ideation.  Labs on Admission: I have personally reviewed following labs and imaging studies  CBC: Recent Labs  Lab 07/16/19 1730  WBC 9.9  HGB 8.6*  HCT 29.9*  MCV 75.9*  PLT 191   Basic Metabolic Panel: Recent Labs  Lab 07/16/19 1730  NA 139  K 3.7  CL 99  CO2 31  GLUCOSE 114*  BUN 9  CREATININE 1.21  CALCIUM 9.2  MG 1.8   GFR: Estimated Creatinine Clearance: 120.3 mL/min (by C-G formula based on SCr of 1.21 mg/dL). Liver Function Tests: No results for input(s): AST, ALT, ALKPHOS, BILITOT, PROT, ALBUMIN in the last 168 hours. No results for input(s): LIPASE, AMYLASE in the last 168 hours. No results for input(s): AMMONIA in the last 168 hours. Coagulation Profile: No results for input(s): INR, PROTIME in the last 168 hours. Cardiac Enzymes: No results for input(s): CKTOTAL, CKMB, CKMBINDEX, TROPONINI in the last 168 hours. BNP (last 3 results) No results for input(s): PROBNP in the last 8760 hours. HbA1C: No results for input(s): HGBA1C in the last 72 hours. CBG: No results for input(s): GLUCAP in the last 168 hours. Lipid Profile: No results for input(s): CHOL, HDL, LDLCALC, TRIG, CHOLHDL, LDLDIRECT in the last 72 hours. Thyroid Function Tests: No results for input(s): TSH, T4TOTAL, FREET4, T3FREE, THYROIDAB in the last 72 hours. Anemia Panel: No results for input(s): VITAMINB12, FOLATE, FERRITIN, TIBC, IRON, RETICCTPCT in the last 72 hours. Urine analysis:    Component Value Date/Time   COLORURINE STRAW (A) 04/13/2019 0044   APPEARANCEUR CLEAR 04/13/2019 0044   LABSPEC 1.009 04/13/2019 0044   PHURINE 5.0 04/13/2019 0044   GLUCOSEU NEGATIVE 04/13/2019 0044   HGBUR NEGATIVE 04/13/2019 0044   BILIRUBINUR NEGATIVE 04/13/2019 0044   KETONESUR NEGATIVE 04/13/2019 0044   PROTEINUR NEGATIVE 04/13/2019 0044   NITRITE NEGATIVE 04/13/2019 0044   LEUKOCYTESUR NEGATIVE 04/13/2019 0044   Sepsis Labs:  '@LABRCNTIP'$ (procalcitonin:4,lacticidven:4) )No results found for this or any previous visit (from the past 240 hour(s)).   Radiological Exams on Admission: Dg Chest Portable 1 View  Result Date: 07/16/2019 CLINICAL DATA:  CHF EXAM: PORTABLE CHEST 1 VIEW COMPARISON:  06/11/2019 FINDINGS: Cardiomegaly. Both lungs are clear. The visualized skeletal structures are unremarkable. IMPRESSION: Cardiomegaly without acute abnormality of the lungs in AP portable projection. Electronically Signed   By: Eddie Candle M.D.   On: 07/16/2019 18:01     EKG: Independently reviewed.  Sinus rhythm, QTC 392, mild T wave inversion in inferior leads and  V5-V6    Assessment/Plan Principal Problem:   Hypertensive emergency Active Problems:   PE (pulmonary thromboembolism) ([place])   Morbid obesity with BMI of 50.0-59.9, adult (HCC)   PAF (paroxysmal atrial fibrillation) (HCC)   OSA on CPAP   HLD (hyperlipidemia)   GERD (gastroesophageal reflux disease)   Acute on chronic diastolic CHF (congestive heart failure) (HCC)   Anemia   Diabetes mellitus without complication ([place])   Hypertensive emergency and hx of HTN: Bp 247/12. Pt has signs of acute on chronic dCHF exacerbation.   -will admit to progressive bed as inpt -Continue Nitroglycerin drip for now, the of goal of bp reduction is by about 25 to 30% in first several hours, at SBP 160 to 180 mmHg. -start lisinopril 5 mg daily -continue home amlodipine, labetalol -pt is on IV lasix  Acute on chronic diastolic CHF (congestive heart failure) ([place]): 2D echo on 01/22/2019 showed EF 60 to 65%.  Patient has 3+ leg edema, and 15 [name] of weight gain, consistent with CHF exacerbation. BNP is 29.4 which is likely falsely low due to morbid obesity. -Lasix 100 mg bid by IV -trop x 3 -2d echo -Daily weights -strict I/O's -Low salt diet -Fluid restriction  PE (pulmonary thromboembolism) ([place]): -continue Xarelto  Atrial Fibrillation (PAF): CHA2DS2-VASc Score is  3, needs oral anticoagulation. Patient is on Xarelto at home. Heart rate is well controlled. -continue Xarelto and labetalol  Morbid obesity with BMI of 50.0-59.9, adult ([place]): BMI 57.8 -Diet and exercise.   -Encouraged to lose weight.   OSA - on CPAP  HLD (hyperlipidemia): -lipitor  GERD (gastroesophageal reflux disease): -protonix  Anemia: Microcytic anemia: Hgb 8. 6 (8.0 on 06/26/19), stable -continue iron supplement  Diabetes mellitus without complication ([place]): Last A1c  6.1 on 04/30/19, well controled. Patient is taking metformin at home -SSI   Inpatient status:  # Patient requires inpatient status due to high intensity of service, high risk for further deterioration and high frequency of surveillance required.  I certify that at the point of admission it is my clinical judgment that the patient will require inpatient hospital care spanning beyond 2 midnights from the point of admission.  . This patient has multiple chronic comorbidities including hypertension, hyperlipidemia, diabetes mellitus, chronic respiratory failure on 3 L oxygen, GERD, OSA on CPAP, morbid obesity, rectal bleeding, atrial fibrillation and PE on Xarelto, COVID-19 infection (May 2020), iron deficiency anemia . Now patient has presenting symptoms include hypertensive emergency, acute on chronic diastolic CHF exacerbation . The worrisome physical exam findings include 3+ lateral leg edema and fine crackles on auscultation . The initial radiographic and laboratory data are worrisome because of cardiomegaly on chest x-ray . Current medical needs: please see my assessment and plan . Predictability of an adverse outcome (risk):  Marland Kitchen Patient has multiple comorbidities, now presents with hypertensive emergency and acute on chronic diastolic CHF exacerbation.  His presentation is highly complicated.  Patient is at high risk of deteriorating.  Will need to be treated in hospital for at least 2 days.            DVT ppx: on xarelto Code Status: Full code Family Communication: None at bed side.      Disposition Plan:  Anticipate discharge back to previous home environment Consults called:  none Admission status:    SDU/inpation       Date of Service 07/16/2019    [facility]   If 7PM-7AM, please contact night-coverage [email or web address] Password [email or web address] 07/16/2019, 10:26 PM

## 2019-07-16 NOTE — ED Notes (Signed)
Pt refusing second IV line

## 2019-07-16 NOTE — ED Provider Notes (Signed)
Medical screening examination/treatment/procedure(s) were conducted as a shared visit with non-physician practitioner(s) and myself.  I personally evaluated the patient during the encounter.    Patient reports he had increasing shortness of breath for at least 3 days.  He reports he has gotten much more swollen in his legs.  He reports that his legs are tight and the swelling has moved up above the knees.  He reports that the last time this happened and the swelling got all the way up to his groin it was very uncomfortable.  He has not had a fever or productive cough.  Patient reports one missed dose of Lasix but reports that the swelling was already increasing and he is otherwise been compliant with medications.  He denies chest pain.  Patient is alert.  He has morbid obesity.  Mild to moderate increased work of breathing at rest.  Patient speaking in full sentences appropriately.  No somnolence.  Heart is regular.  Borderline tachycardia cannot appreciate rub murmur gallop.  Breath sounds are soft.  Patient has very thick chest wall.  Cannot appreciate specific wheeze or crackle.  Abdomen is nontender.  Patient has 3+ very firm edema of the lower legs up to the thighs.  Plan for admission.  I agree with plan of management.   Charlesetta Shanks, MD 07/16/19 1827

## 2019-07-16 NOTE — ED Provider Notes (Signed)
[facility] EMERGENCY DEPARTMENT Provider Note   CSN: [id number] Arrival date & time: 07/16/19  1722     History   Chief Complaint Chief Complaint  Patient presents with  . Shortness of Breath    HPI Benjamin [name] is a 53 y.o. male.     Patient with h/o HFpEF (torsemide 126m bid), A fib on anticoagulation (rivaroxaban), PE, T2DM, OSA, chronic respiratory failure on 3L at home --presents with complaint of 15 pound weight gain and worsening of lower extremity swelling over the past 1 week.  Patient states "I need to be pumped out".  States that this is occurred several times in the past and he has needed admission.  States that he has a paramedic that comes out to help fix his medications and has been taking medications.  He denies any fevers or chills.  No known coronavirus or other sick contacts.  He has some general tightness across his chest with shortness of breath that is worse with exertion and lying flat.  No abdominal pain.  No vomiting or diarrhea.  Transported by EMS.  Twelve-lead reportedly normal.  COVID+ in May, recovered.      Past Medical History:  Diagnosis Date  . A-fib ([place]   . Anemia 05/29/2019  . CHF (congestive heart failure) ([facility]   . Chronic respiratory failure ([facility]   . Diabetes mellitus without complication (HW[place]   . Elevated troponin 02/06/2019  . Hypertension   . Obesity   . Pulmonary embolism ([facility]   . Rectal bleeding 05/29/2019  . Sleep apnea     Patient Active Problem List   Diagnosis Date Noted  . BRBPR (bright red blood per rectum) 06/12/2019  . Hypertension   . CHF (congestive heart failure) ([facility]   . Sleep apnea   . Chronic respiratory failure ([facility]   . Lower GI bleeding 06/11/2019  . Hypokalemia 05/29/2019  . GI bleed 05/29/2019  . Anemia 05/28/2019  . Acute on chronic diastolic CHF (congestive heart failure) ([facility] 04/11/2019  . Iron deficiency anemia 03/11/2019  . OSA on CPAP 03/11/2019  . HLD  (hyperlipidemia) 03/11/2019  . Elevated troponin 03/11/2019  . GERD (gastroesophageal reflux disease) 03/11/2019  . Rectal bleeding 02/20/2019  . Dyspnea 02/06/2019  . COVID-19 virus infection 02/06/2019  . Bilateral lower extremity edema   . Morbid obesity with BMI of 50.0-59.9, adult ([facility]   . Chronic diastolic CHF (congestive heart failure) ([facility]   . PAF (paroxysmal atrial fibrillation) ([facility]   . PE (pulmonary thromboembolism) ([place] 01/21/2019  . Hypertensive urgency 01/21/2019  . Diabetes mellitus type 2 in obese ([place] 01/21/2019    Past Surgical History:  Procedure Laterality Date  . COLONOSCOPY WITH PROPOFOL Left 02/25/2019   Procedure: COLONOSCOPY WITH PROPOFOL;  Surgeon: OArta Silence MD;  [facility]  Service: Endoscopy;  Laterality: Left;  . ESOPHAGOGASTRODUODENOSCOPY (EGD) WITH PROPOFOL Left 02/25/2019   Procedure: ESOPHAGOGASTRODUODENOSCOPY (EGD) WITH PROPOFOL;  Surgeon: OArta Silence MD;  [facility];  Service: Endoscopy;  Laterality: Left;  . ESOPHAGOGASTRODUODENOSCOPY (EGD) WITH PROPOFOL N/A 06/12/2019   Procedure: ESOPHAGOGASTRODUODENOSCOPY (EGD) WITH PROPOFOL;  Surgeon: MClarene Essex MD;  [facility]  Service: Gastroenterology;  Laterality: N/A;  . FLEXIBLE SIGMOIDOSCOPY N/A 06/12/2019   Procedure: FLEXIBLE SIGMOIDOSCOPY;  Surgeon: MClarene Essex MD;  [facility]  Service: Gastroenterology;  Laterality: N/A;  . NO PAST SURGERIES    . RIGHT HEART CATH N/A 04/17/2019   Procedure: RIGHT HEART CATH;  Surgeon: MLarey Dresser MD;  Location: [facility];  Service: Cardiovascular;  Laterality: N/A;        Home Medications    Prior to Admission medications   Medication Sig Start Date End Date Taking? Authorizing Provider  albuterol (VENTOLIN HFA) 108 (90 Base) MCG/ACT inhaler Inhale 2 puffs into the lungs every 6 (six) hours as needed for wheezing or shortness of breath. 06/24/19   [name], NP  amLODipine  (NORVASC) 10 MG tablet Take 1 tablet (10 mg total) by mouth daily. 06/04/19 09/02/19  [name], NP  atorvastatin (LIPITOR) 20 MG tablet Take 1 tablet (20 mg total) by mouth at bedtime. 06/04/19 09/02/19  [name], NP  Blood Glucose Monitoring Suppl (TRUE METRIX METER) w/Device KIT Use as instructed. Check blood glucose level by fingerstick twice per day. 03/18/19   [name], NP  carboxymethylcellulose (REFRESH PLUS) 0.5 % SOLN Place 1 drop into both eyes daily.     [provider]  docusate sodium (COLACE) 100 MG capsule Take 1 capsule (100 mg total) by mouth 2 (two) times daily. 06/28/19 09/26/19  [name], NP  ferrous sulfate 325 (65 FE) MG tablet Take 1 tablet (325 mg total) by mouth daily. Patient will pick up scripts today. 06/24/19 09/22/19  [name], NP  glucose blood (TRUE METRIX BLOOD GLUCOSE TEST) test strip Use as instructed. Check blood glucose level by fingerstick twice per day. 03/18/19   [name], NP  hydrocortisone (ANUSOL-HC) 25 MG suppository Place 1 suppository (25 mg total) rectally 2 (two) times daily. 06/04/19   [name], NP  labetalol (NORMODYNE) 200 MG tablet Take 1 tablet (200 mg total) by mouth 2 (two) times daily. 04/19/19   Kayleen Memos, DO  levalbuterol [facility] HFA) 45 MCG/ACT inhaler Inhale 2 puffs into the lungs every 4 (four) hours as needed for wheezing.     [provider]  metFORMIN (GLUCOPHAGE) 500 MG tablet Take 1 tablet (500 mg total) by mouth 2 (two) times daily with a meal. Patient will pick up scripts today. 06/28/19 09/26/19  [name], NP  Misc. Devices MISC Please provide Mr. Beske with insurance approved portable O2 concentrator ICD 10 J96.11 Z99.81 06/30/19   [name], NP  Misc. Devices MISC Requires O2 @ 3L/min continuously via nasal canula and home fill system,. 07/03/19   [name], NP  omeprazole (PRILOSEC) 20 MG capsule Take 1 capsule (20 mg total) by mouth daily.  05/19/19   [name], NP  polyethylene glycol (MIRALAX / GLYCOLAX) 17 g packet Take 17 g by mouth daily as needed for severe constipation. 06/24/19 07/24/19  [name], NP  potassium chloride SA (K-DUR) 20 MEQ tablet Take 2 tablets (40 mEq total) by mouth 2 (two) times daily. 06/05/19 07/05/19  Clegg, Amy D, NP  rivaroxaban (XARELTO) 20 MG TABS tablet Take 1 tablet (20 mg total) by mouth daily with supper. 04/19/19   Kayleen Memos, DO  torsemide (DEMADEX) 100 MG tablet Take 1 tablet (100 mg total) by mouth 2 (two) times daily. 07/09/19   Clegg, Amy D, NP  TRUEplus Lancets 28G MISC Use as instructed. Check blood glucose level by fingerstick twice per day. 03/18/19   [name], NP    Family History Family History  Problem Relation Age of Onset  . Hypertension Mother   . Diabetes Mother     Social History Social History   Tobacco Use  . Smoking  status: Former Research scientist (life sciences)  . Smokeless tobacco: Never Used  Substance Use Topics  . Alcohol use: Not Currently  . Drug use: Not Currently     Allergies   Patient has no known allergies.   Review of Systems Review of Systems  Constitutional: Positive for unexpected weight change. Negative for diaphoresis and fever.  HENT: Negative for rhinorrhea and sore throat.   Eyes: Negative for redness.  Respiratory: Positive for chest tightness and shortness of breath. Negative for cough.   Cardiovascular: Positive for leg swelling. Negative for chest pain and palpitations.  Gastrointestinal: Negative for abdominal pain, diarrhea, nausea and vomiting.  Genitourinary: Negative for dysuria.  Musculoskeletal: Negative for back pain, myalgias and neck pain.  Skin: Negative for rash.  Neurological: Negative for syncope, light-headedness and headaches.  Psychiatric/Behavioral: The patient is not nervous/anxious.      Physical Exam Updated Vital Signs BP (!) 196/118 (BP Location: Right Wrist)   Pulse 96   Temp 98.4 F (36.9 C) (Oral)    Resp (!) 22   Ht 5' 11"  (1.803 m)   SpO2 98%   BMI 57.88 kg/m   Physical Exam Vitals signs and nursing note reviewed.  Constitutional:      Appearance: He is well-developed.  HENT:     Head: Normocephalic and atraumatic.  Eyes:     General:        Right eye: No discharge.        Left eye: No discharge.     Conjunctiva/sclera: Conjunctivae normal.  Neck:     Musculoskeletal: Normal range of motion and neck supple.  Cardiovascular:     Rate and Rhythm: Normal rate and regular rhythm.     Heart sounds: Normal heart sounds.  Pulmonary:     Effort: Pulmonary effort is normal. Tachypnea present.     Breath sounds: Decreased breath sounds (all fields) present.     Comments: Mild tachpnea, sitting upright in position of comfort Abdominal:     Palpations: Abdomen is soft.     Tenderness: There is no abdominal tenderness.  Musculoskeletal:     Right lower leg: He exhibits tenderness. Edema present.     Left lower leg: He exhibits tenderness. Edema present.     Comments: Tense lower extremity bilaterally, no weeping.   Skin:    General: Skin is warm and dry.  Neurological:     Mental Status: He is alert.      ED Treatments / Results  Labs (all labs ordered are listed, but only abnormal results are displayed) Labs Reviewed  BASIC METABOLIC PANEL - Abnormal; Notable for the following components:      Result Value   Glucose, Bld 114 (*)    All other components within normal limits  CBC - Abnormal; Notable for the following components:   RBC 3.94 (*)    Hemoglobin 8.6 (*)    HCT 29.9 (*)    MCV 75.9 (*)    MCH 21.8 (*)    MCHC 28.8 (*)    RDW 20.1 (*)    All other components within normal limits  SARS CORONAVIRUS 2 (TAT 6-24 HRS)  MAGNESIUM  BRAIN NATRIURETIC PEPTIDE    EKG EKG Interpretation  Date/Time:  Tuesday July 16 2019 17:30:04 EDT Ventricular Rate:  93 PR Interval:    QRS Duration: 90 QT Interval:  315 QTC Calculation: 392 R Axis:   72 Text  Interpretation: Sinus rhythm no STEMI. dynamic inferior/lateral T wave inversions seen on earlier tracings Confirmed by  Charlesetta Shanks [phone]) on 07/16/2019 6:29:20 PM   Radiology Dg Chest Portable 1 View  Result Date: 07/16/2019 CLINICAL DATA:  CHF EXAM: PORTABLE CHEST 1 VIEW COMPARISON:  06/11/2019 FINDINGS: Cardiomegaly. Both lungs are clear. The visualized skeletal structures are unremarkable. IMPRESSION: Cardiomegaly without acute abnormality of the lungs in AP portable projection. Electronically Signed   By: Eddie Candle M.D.   On: 07/16/2019 18:01    Procedures Procedures (including critical care time)  Medications Ordered in ED Medications  furosemide (LASIX) 100 mg in dextrose 5 % 50 mL IVPB (100 mg Intravenous New Bag/Given 07/16/19 1907)  nitroGLYCERIN 50 mg in dextrose 5 % 250 mL (0.2 mg/mL) infusion (has no administration in time range)  albuterol (VENTOLIN HFA) 108 (90 Base) MCG/ACT inhaler 2 puff (has no administration in time range)  nitroGLYCERIN (NITROGLYN) 2 % ointment 1 inch (1 inch Topical Given 07/16/19 1937)  potassium chloride SA (KLOR-CON) CR tablet 20 mEq (20 mEq Oral Given 07/16/19 1842)     Initial Impression / Assessment and Plan / ED Course  I have reviewed the triage vital signs and the nursing notes.  Pertinent labs & imaging results that were available during my care of the patient were reviewed by me and considered in my medical decision making (see chart for details).        Patient seen and examined. Appears fluided overloaded. Work-up initiated. Will start on nitro drip 2/2 elevated BP. Reviewed CHF notes. Discussed with Dr. Johnney Killian who will see.   Vital signs reviewed and are as follows: BP (!) 196/118 (BP Location: Right Wrist)   Pulse 96   Temp 98.4 F (36.9 C) (Oral)   Resp (!) 22   Ht 5' 11"  (1.803 m)   SpO2 98%   BMI 57.88 kg/m   7:27 PM BP higher. I evaluated patient. He is sleeping. Stable. Receiving IV lasix. Refused 2nd  line and has not yet been started on NTG drip.  Will place paste in interim. Will call for admit.   7:42 PM Spoke with Dr. Blaine Hamper who will see.   CRITICAL CARE Performed by: Carlisle Cater PA-C Total critical care time: 40 minutes Critical care time was exclusive of separately billable procedures and treating other patients. Critical care was necessary to treat or prevent imminent or life-threatening deterioration. Critical care was time spent personally by me on the following activities: development of treatment plan with patient and/or surrogate as well as nursing, discussions with consultants, evaluation of patient's response to treatment, examination of patient, obtaining history from patient or surrogate, ordering and performing treatments and interventions, ordering and review of laboratory studies, ordering and review of radiographic studies, pulse oximetry and re-evaluation of patient's condition.    Final Clinical Impressions(s) / ED Diagnoses   Final diagnoses:  Hypertensive emergency   Admit.   ED Discharge Orders    None       Carlisle Cater, Hershal Coria 07/16/19 1946    Charlesetta Shanks, MD 07/17/19 [phone]

## 2019-07-16 NOTE — ED Notes (Signed)
Pt refusing nitro gtt at this time. MD notified

## 2019-07-16 NOTE — Progress Notes (Signed)
Paramedicine Encounter    Patient ID: Benjamin [name], male    DOB: 01/15/1966, 53 y.o.   MRN: [id number]   Patient Care Team: [name], NP as PCP - General (Nurse Practitioner) [name], MD as PCP - Cardiology (Cardiology) [name], LCSW as Social Worker (Licensed Clinical Social Worker)  Patient Active Problem List   Diagnosis Date Noted  . BRBPR (bright red blood per rectum) 06/12/2019  . Hypertension   . CHF (congestive heart failure) ([place])   . Sleep apnea   . Chronic respiratory failure ([place])   . Lower GI bleeding 06/11/2019  . Hypokalemia 05/29/2019  . GI bleed 05/29/2019  . Anemia 05/28/2019  . Acute on chronic diastolic CHF (congestive heart failure) ([place]) 04/11/2019  . Iron deficiency anemia 03/11/2019  . OSA on CPAP 03/11/2019  . HLD (hyperlipidemia) 03/11/2019  . Elevated troponin 03/11/2019  . GERD (gastroesophageal reflux disease) 03/11/2019  . Rectal bleeding 02/20/2019  . Dyspnea 02/06/2019  . COVID-19 virus infection 02/06/2019  . Bilateral lower extremity edema   . Morbid obesity with BMI of 50.0-59.9, adult ([name])   . Chronic diastolic CHF (congestive heart failure) ([place])   . PAF (paroxysmal atrial fibrillation) ([place])   . PE (pulmonary thromboembolism) ([place]) 01/21/2019  . Hypertensive urgency 01/21/2019  . Diabetes mellitus type 2 in obese ([name]) 01/21/2019    Current Outpatient Medications:  .  albuterol (VENTOLIN HFA) 108 (90 Base) MCG/ACT inhaler, Inhale 2 puffs into the lungs every 6 (six) hours as needed for wheezing or shortness of breath., Disp: 18 g, Rfl: 0 .  amLODipine (NORVASC) 10 MG tablet, Take 1 tablet (10 mg total) by mouth daily., Disp: 90 tablet, Rfl: 1 .  atorvastatin (LIPITOR) 20 MG tablet, Take 1 tablet (20 mg total) by mouth at bedtime., Disp: 90 tablet, Rfl: 2 .  Blood Glucose Monitoring Suppl (TRUE METRIX METER) w/Device KIT, Use as instructed. Check blood glucose level by fingerstick twice per day., Disp: 1 kit,  Rfl: 0 .  carboxymethylcellulose (REFRESH PLUS) 0.5 % SOLN, Place 1 drop into both eyes daily. , Disp: , Rfl:  .  docusate sodium (COLACE) 100 MG capsule, Take 1 capsule (100 mg total) by mouth 2 (two) times daily., Disp: 180 capsule, Rfl: 1 .  ferrous sulfate 325 (65 FE) MG tablet, Take 1 tablet (325 mg total) by mouth daily. Patient will pick up scripts today., Disp: 90 tablet, Rfl: 0 .  glucose blood (TRUE METRIX BLOOD GLUCOSE TEST) test strip, Use as instructed. Check blood glucose level by fingerstick twice per day., Disp: 100 each, Rfl: 12 .  hydrocortisone (ANUSOL-HC) 25 MG suppository, Place 1 suppository (25 mg total) rectally 2 (two) times daily., Disp: 36 suppository, Rfl: 1 .  labetalol (NORMODYNE) 200 MG tablet, Take 1 tablet (200 mg total) by mouth 2 (two) times daily., Disp: 120 tablet, Rfl: 0 .  levalbuterol (XOPENEX HFA) 45 MCG/ACT inhaler, Inhale 2 puffs into the lungs every 4 (four) hours as needed for wheezing. , Disp: , Rfl:  .  metFORMIN (GLUCOPHAGE) 500 MG tablet, Take 1 tablet (500 mg total) by mouth 2 (two) times daily with a meal. Patient will pick up scripts today., Disp: 180 tablet, Rfl: 1 .  Misc. Devices MISC, Please provide Mr. Illes with insurance approved portable O2 concentrator ICD 10 J96.11 Z99.81, Disp: 1 each, Rfl: 0 .  Misc. Devices MISC, Requires O2 @ 3L/min continuously via nasal canula and home fill system,., Disp: 1 each, Rfl: 0 .  omeprazole (PRILOSEC) 20 MG capsule, Take 1 capsule (20 mg total) by mouth daily., Disp: 30 capsule, Rfl: 3 .  polyethylene glycol (MIRALAX / GLYCOLAX) 17 g packet, Take 17 g by mouth daily as needed for severe constipation., Disp: 30 each, Rfl: 3 .  rivaroxaban (XARELTO) 20 MG TABS tablet, Take 1 tablet (20 mg total) by mouth daily with supper., Disp: 60 tablet, Rfl: 0 .  torsemide (DEMADEX) 100 MG tablet, Take 1 tablet (100 mg total) by mouth 2 (two) times daily., Disp: 180 tablet, Rfl: 3 .  TRUEplus Lancets 28G MISC, Use as  instructed. Check blood glucose level by fingerstick twice per day., Disp: 200 each, Rfl: 3 .  potassium chloride SA (K-DUR) 20 MEQ tablet, Take 2 tablets (40 mEq total) by mouth 2 (two) times daily., Disp: 360 tablet, Rfl: 3 No Known Allergies    Social History   Socioeconomic History  . Marital status: Married    Spouse name: Not on file  . Number of children: Not on file  . Years of education: Not on file  . Highest education level: Not on file  Occupational History  . Not on file  Social Needs  . Financial resource strain: Not on file  . Food insecurity    Worry: Not on file    Inability: Not on file  . Transportation needs    Medical: Not on file    Non-medical: Not on file  Tobacco Use  . Smoking status: Former Research scientist (life sciences)  . Smokeless tobacco: Never Used  Substance and Sexual Activity  . Alcohol use: Not Currently  . Drug use: Not Currently  . Sexual activity: Not Currently  Lifestyle  . Physical activity    Days per week: Not on file    Minutes per session: Not on file  . Stress: Not on file  Relationships  . Social Herbalist on phone: Not on file    Gets together: Not on file    Attends religious service: Not on file    Active member of club or organization: Not on file    Attends meetings of clubs or organizations: Not on file    Relationship status: Not on file  . Intimate partner violence    Fear of current or ex partner: Not on file    Emotionally abused: Not on file    Physically abused: Not on file    Forced sexual activity: Not on file  Other Topics Concern  . Not on file  Social History Narrative  . Not on file    Physical Exam      Future Appointments  Date Time Provider [place]  07/26/2019 10:30 AM [facility] PA/NP [facility] None  08/05/2019 11:30 AM [facility] PA/NP [facility] None  08/19/2019 12:00 PM [name], MD [facility] None  09/30/2019  1:30 PM [name], NP [facility] None    BP (!) 180/0   Pulse 95    Temp 97.8 F (36.6 C)   Resp (!) 22   Wt (!) 415 lb (188.2 kg)   SpO2 97%   BMI 57.88 kg/m   Weight yesterday-411 Last visit weight-409  [facility]-148  Pt states he is not feeling well, he has increased swelling to his left leg. His weight is up again. He has missed a few doses of his meds. Increased sob with chest tightness. He just looks bad.  No extra sodium, extra fluids during the day probably plus with the missed doses of his meds.  No bleeding issues.  meds verified and pill box refilled. He initially was refusing txp to hosp, said he wanted to wait. However I was able to get him to decide to go, I requested our EMS unit to come take him to the hosp.  Will f/u once he gets out of hosp.    [name], [facility] Paramedic  07/16/19

## 2019-07-16 NOTE — Telephone Encounter (Addendum)
CSW called taxi to take patient to housing application appt for local tax credit apartments  CSW able to meet with pt and [place] site Freight forwarder and assisted with application process.  Manager states that as long as patient's paperwork goes through he can have the apartment as he was first on the list for it.  Manager will be processing the paperwork over the next week or so and contact CSW if she needs any additional paperwork- needs Social Security care or verification- letter from social security is currently in the mail so hopefully will arrive this week.  Manager also verified that apartment would be available prior to December 9th which is patients move out date from [employer].  CSW will continue to follow and assist as needed.  [name], LCSW Clinical Social Worker [facility] Desk#: [phone] Cell#: [phone]

## 2019-07-17 ENCOUNTER — Inpatient Hospital Stay (HOSPITAL_COMMUNITY): Payer: Medicaid Other

## 2019-07-17 DIAGNOSIS — I2699 Other pulmonary embolism without acute cor pulmonale: Secondary | ICD-10-CM

## 2019-07-17 DIAGNOSIS — D649 Anemia, unspecified: Secondary | ICD-10-CM | POA: Diagnosis not present

## 2019-07-17 DIAGNOSIS — I480 Paroxysmal atrial fibrillation: Secondary | ICD-10-CM

## 2019-07-17 DIAGNOSIS — I161 Hypertensive emergency: Secondary | ICD-10-CM | POA: Diagnosis not present

## 2019-07-17 DIAGNOSIS — E119 Type 2 diabetes mellitus without complications: Secondary | ICD-10-CM | POA: Diagnosis not present

## 2019-07-17 DIAGNOSIS — I5033 Acute on chronic diastolic (congestive) heart failure: Secondary | ICD-10-CM | POA: Diagnosis not present

## 2019-07-17 LAB — BASIC METABOLIC PANEL
Anion gap: 8 (ref 5–15)
BUN: 11 mg/dL (ref 6–20)
CO2: 35 mmol/L — ABNORMAL HIGH (ref 22–32)
Calcium: 9.2 mg/dL (ref 8.9–10.3)
Chloride: 97 mmol/L — ABNORMAL LOW (ref 98–111)
Creatinine, Ser: 1.25 mg/dL — ABNORMAL HIGH (ref 0.61–1.24)
GFR calc Af Amer: >60 mL/min (ref >60)
GFR calc non Af Amer: >60 mL/min (ref >60)
Glucose, Bld: 132 mg/dL — ABNORMAL HIGH (ref 70–99)
Potassium: 4 mmol/L (ref 3.5–5.1)
Sodium: 140 mmol/L (ref 135–145)

## 2019-07-17 LAB — RAPID URINE DRUG SCREEN, HOSP PERFORMED
Amphetamines: NOT DETECTED
Barbiturates: NOT DETECTED
Benzodiazepines: NOT DETECTED
Cocaine: NOT DETECTED
Opiates: NOT DETECTED
Tetrahydrocannabinol: NOT DETECTED

## 2019-07-17 LAB — MAGNESIUM: Magnesium: 1.8 mg/dL (ref 1.7–2.4)

## 2019-07-17 LAB — GLUCOSE, CAPILLARY
Glucose-Capillary: 150 mg/dL — ABNORMAL HIGH (ref 70–99)
Glucose-Capillary: 126 mg/dL — ABNORMAL HIGH (ref 70–99)
Glucose-Capillary: 131 mg/dL — ABNORMAL HIGH (ref 70–99)

## 2019-07-17 LAB — ECHOCARDIOGRAM COMPLETE
Height: 71 in
Weight: 6624 oz

## 2019-07-17 LAB — CBG MONITORING, ED: Glucose-Capillary: 133 mg/dL — ABNORMAL HIGH (ref 70–99)

## 2019-07-17 MED ORDER — POTASSIUM CHLORIDE CRYS ER 20 MEQ PO TBCR
20.0000 meq | EXTENDED_RELEASE_TABLET | Freq: Every day | ORAL | Status: DC
  Administered 2019-07-17 – 2019-07-18 (×2): 20 meq via ORAL
  Filled 2019-07-17 (×2): qty 1

## 2019-07-17 MED ORDER — FUROSEMIDE 10 MG/ML IJ SOLN
80.0000 mg | Freq: Once | INTRAMUSCULAR | Status: AC
  Administered 2019-07-17: 80 mg via INTRAVENOUS
  Filled 2019-07-17: qty 8

## 2019-07-17 MED ORDER — ALBUTEROL SULFATE (2.5 MG/3ML) 0.083% IN NEBU
2.5000 mg | INHALATION_SOLUTION | RESPIRATORY_TRACT | Status: DC | PRN
  Administered 2019-07-17: 2.5 mg via RESPIRATORY_TRACT
  Filled 2019-07-17: qty 3

## 2019-07-17 MED ORDER — LABETALOL HCL 5 MG/ML IV SOLN
10.0000 mg | INTRAVENOUS | Status: DC | PRN
  Administered 2019-07-17: 10 mg via INTRAVENOUS
  Filled 2019-07-17: qty 4

## 2019-07-17 MED ORDER — TORSEMIDE 100 MG PO TABS
100.0000 mg | ORAL_TABLET | Freq: Two times a day (BID) | ORAL | Status: DC
  Administered 2019-07-18: 100 mg via ORAL
  Filled 2019-07-17: qty 1

## 2019-07-17 MED ORDER — PERFLUTREN LIPID MICROSPHERE
1.0000 mL | INTRAVENOUS | Status: AC | PRN
  Administered 2019-07-17: 12:00:00 2 mL via INTRAVENOUS
  Filled 2019-07-17: qty 10

## 2019-07-17 MED ORDER — HYDRALAZINE HCL 50 MG PO TABS: 50.0000 mg | ORAL_TABLET | Freq: Four times a day (QID) | ORAL | Status: DC | PRN

## 2019-07-17 NOTE — ED Notes (Signed)
Patient is refusing nitro drip because it gives him headaches

## 2019-07-17 NOTE — Progress Notes (Signed)
PROGRESS NOTE    Benjamin [name]  MEQ:[id number] DOB: 12/15/1965 DOA: 07/16/2019 PCP: [name], NP      Brief Narrative:  Benjamin [name] is a 53 y.o. M with MO/OHS/RHF, OSA on CPAP, dCHF, pAF on Xarelto, DM, HTN and bleeding hemorrhoids requiring PRBC transfusion and admission x2 in Sept 2020 who presented with dyspnea and leg swelling.  The patient had had leg swelling, orthopnea and dyspnea develop one week ago, saw HF clinic, given Lasix 80 mg IV in the office, diuresed 1300cc, felt better for a day or so, then started to get worse.  In last few days, SOB, chest tightness, and leg swelling got worse.  Home weights up to 188.2kg from baseline 180-182 with paramedicine.  In the ER, Cr 1.2, BP 247/128 mmHg, tachypneic and CXR nondiagnostic.  He was started on IV lasix and admitted for CHF.       Assessment & Plan:  Hypertensive emergency BP improved overnight.  Patient refused nitro gtt Patient notes med nonadherence prior to admission -Stop nitro paste -Continue labetalol, lisinopril, amlodipine -Continue PRN hydralazine   Probably right heart failure EF today, normal, 65-70%.  RHC in Sep showed pHTN 40/25, normal cardiac output.  Presents with weight 6-8kg above dry weight.  Dyspneic, legs swollen.  CXR clear.    Net negative 1.3L last night, this morning already 2L output.  Cr and K stable. -Finish IV Lasix today, resume home torsemide tomorrow -K supplement -Strict I/Os, daily weights, telemetry  -Daily monitoring renal function     Obesity hypoventilation MO OSA -Continue CPAP  Paroxysmal atrial fibrillation Rate controlled -Continue labetalol -Continue Xarelto  Diabetes Glucoses good -Continue SSI corrections -Continue atorvastatin -Hold home metformin  History PE -Continue Xarelto  Chronic blood loss from hemorrhoids anemia -Continue iron      MDM and disposition: The below labs and imaging reports were reviewed and summarized  above.  Medication management as above.  SEvere exacerbation of his chronic disease.  The patient was admitted with dyspnea and edema from med nonadherence.  This is likely some right heart failure.  He needs compliance with CPAP and meds.          DVT prophylaxis: N/A on Xarelto Code Status: FULL Family Communication:     Consultants:     Procedures:   10/28 echo EF normal  Antimicrobials:       Subjective: Doing well.  Still dyspneic just walking across the room.  No orthopnea.  Still leg swelling.  No confusion, fever, sputum, wheezing, cough.  Objective: Vitals:   07/17/19 0932 07/17/19 1010 07/17/19 1236 07/17/19 1322  BP: (!) 170/97 137/88  (!) 159/81  Pulse: 88 87 93 90  Resp:   20   Temp: 99.1 F (37.3 C)     TempSrc: Oral     SpO2: 96% 95% 94% 92%  Weight: (!) 187.8 kg     Height: 5\' 11"  (1.803 m)       Intake/Output Summary (Last 24 hours) at 07/17/2019 1545 Last data filed at 07/17/2019 1300 Gross per 24 hour  Intake 784 ml  Output 4150 ml  Net -3366 ml   Filed Weights   07/17/19 0932  Weight: (!) 187.8 kg    Examination: General appearance: obese adult male, alert and in no acute distress.   HEENT: Anicteric, conjunctiva pink, lids and lashes normal. No nasal deformity, discharge, epistaxis.  Lips moist, dentition good, OP moist, no oral lesions, hearing normal.   Skin: Warm and dry.  No suspicious rashes or lesions.  Chronic venous stasis changes to legs Cardiac: RRR, nl S1-S2, no murmurs appreciated.  Capillary refill is brisk.  JVP not visible.  1+ brawny LE edema.  Radia  pulses 2+ and symmetric. Respiratory: Normal respiratory rate and rhythm.  CTAB without rales or wheezes.  Dyspneci with minimal exertion. Abdomen: Abdomen soft.  No TTP. No ascites, distension, hepatosplenomegaly.   MSK: No deformities or effusions. Neuro: Awake and alert.  EOMI, moves all extremities. Speech fluent.    Psych: Sensorium intact and responding to  questions, attention normal. Affect normal.  Judgment and insight appear normal.    Data Reviewed: I have personally reviewed following labs and imaging studies:  CBC: Recent Labs  Lab 07/16/19 1730  WBC 9.9  HGB 8.6*  HCT 29.9*  MCV 75.9*  PLT 343   Basic Metabolic Panel: Recent Labs  Lab 07/16/19 1730 07/17/19 0116  NA 139 140  K 3.7 4.0  CL 99 97*  CO2 31 35*  GLUCOSE 114* 132*  BUN 9 11  CREATININE 1.21 1.25*  CALCIUM 9.2 9.2  MG 1.8 1.8   GFR: Estimated Creatinine Clearance: 116.3 mL/min (A) (by C-G formula based on SCr of 1.25 mg/dL (H)). Liver Function Tests: No results for input(s): AST, ALT, ALKPHOS, BILITOT, PROT, ALBUMIN in the last 168 hours. No results for input(s): LIPASE, AMYLASE in the last 168 hours. No results for input(s): AMMONIA in the last 168 hours. Coagulation Profile: No results for input(s): INR, PROTIME in the last 168 hours. Cardiac Enzymes: No results for input(s): CKTOTAL, CKMB, CKMBINDEX, TROPONINI in the last 168 hours. BNP (last 3 results) No results for input(s): PROBNP in the last 8760 hours. HbA1C: No results for input(s): HGBA1C in the last 72 hours. CBG: Recent Labs  Lab 07/17/19 0025 07/17/19 1112  GLUCAP 133* 150*   Lipid Profile: No results for input(s): CHOL, HDL, LDLCALC, TRIG, CHOLHDL, LDLDIRECT in the last 72 hours. Thyroid Function Tests: No results for input(s): TSH, T4TOTAL, FREET4, T3FREE, THYROIDAB in the last 72 hours. Anemia Panel: No results for input(s): VITAMINB12, FOLATE, FERRITIN, TIBC, IRON, RETICCTPCT in the last 72 hours. Urine analysis:    Component Value Date/Time   COLORURINE STRAW (A) 04/13/2019 0044   APPEARANCEUR CLEAR 04/13/2019 0044   LABSPEC 1.009 04/13/2019 0044   PHURINE 5.0 04/13/2019 0044   GLUCOSEU NEGATIVE 04/13/2019 0044   HGBUR NEGATIVE 04/13/2019 0044   BILIRUBINUR NEGATIVE 04/13/2019 0044   KETONESUR NEGATIVE 04/13/2019 0044   PROTEINUR NEGATIVE 04/13/2019 0044    NITRITE NEGATIVE 04/13/2019 0044   LEUKOCYTESUR NEGATIVE 04/13/2019 0044   Sepsis Labs: @LABRCNTIP (procalcitonin:4,lacticacidven:4)  ) Recent Results (from the past 240 hour(s))  SARS CORONAVIRUS 2 (TAT 6-24 HRS) Nasopharyngeal Nasopharyngeal Swab     Status: None   Collection Time: 07/16/19  5:32 PM   Specimen: Nasopharyngeal Swab  Result Value Ref Range Status   SARS Coronavirus 2 NEGATIVE NEGATIVE Final    Comment: (NOTE) SARS-CoV-2 target nucleic acids are NOT DETECTED. The SARS-CoV-2 RNA is generally detectable in upper and lower respiratory specimens during the acute phase of infection. Negative results do not preclude SARS-CoV-2 infection, do not rule out co-infections with other pathogens, and should not be used as the sole basis for treatment or other patient management decisions. Negative results must be combined with clinical observations, patient history, and epidemiological information. The expected result is Negative. Fact Sheet for Patients: 07/18/19 Fact Sheet for Healthcare Providers: HairSlick.no This test is not yet approved or cleared  by the [place] and  has been authorized for detection and/or diagnosis of SARS-CoV-2 by FDA under an Emergency Use Authorization (EUA). This EUA will remain  in effect (meaning this test can be used) for the duration of the COVID-19 declaration under Section 56 4(b)(1) of the Act, 21 U.S.C. section 360bbb-3(b)(1), unless the authorization is terminated or revoked sooner. Performed at [facility], [address] [place], [place] [address]          Radiology Studies: Dg Chest Portable 1 View  Result Date: 07/16/2019 CLINICAL DATA:  CHF EXAM: PORTABLE CHEST 1 VIEW COMPARISON:  06/11/2019 FINDINGS: Cardiomegaly. Both lungs are clear. The visualized skeletal structures are unremarkable. IMPRESSION: Cardiomegaly without acute abnormality of the  lungs in AP portable projection. Electronically Signed   By: Lauralyn Primes M.D.   On: 07/16/2019 18:01        Scheduled Meds: . amLODipine  10 mg Oral Daily  . atorvastatin  20 mg Oral QHS  . ferrous sulfate  325 mg Oral Daily  . furosemide  80 mg Intravenous Once  . insulin aspart  0-5 Units Subcutaneous QHS  . insulin aspart  0-9 Units Subcutaneous TID WC  . labetalol  200 mg Oral BID  . lisinopril  5 mg Oral Daily  . pantoprazole  40 mg Oral Daily  . potassium chloride  20 mEq Oral Daily  . rivaroxaban  20 mg Oral Q supper  . sodium chloride flush  3 mL Intravenous Q12H  . [START ON 07/18/2019] torsemide  100 mg Oral BID   Continuous Infusions: . sodium chloride       LOS: 1 day    Time spent: 35 minutes    Alberteen Sam, MD [facility] 07/17/2019, 3:45 PM     Please page through AMION:  [email or web address] If 7PM-7AM, please contact night-coverage

## 2019-07-17 NOTE — Progress Notes (Signed)
  Echocardiogram 2D Echocardiogram has been performed.  Benjamin [name] 07/17/2019, 11:49 AM

## 2019-07-17 NOTE — ED Notes (Signed)
Patient demanding food. Notified of his low sodium diet. Patient given water and crackers while we wait for breakfast tray.

## 2019-07-17 NOTE — Procedures (Signed)
Declined CPAP tonight.

## 2019-07-17 NOTE — ED Notes (Signed)
Patient put on hospital bed for comfort. Patient unable to tolerate stretcher.

## 2019-07-18 ENCOUNTER — Other Ambulatory Visit (HOSPITAL_COMMUNITY): Payer: Self-pay

## 2019-07-18 DIAGNOSIS — E119 Type 2 diabetes mellitus without complications: Secondary | ICD-10-CM | POA: Diagnosis not present

## 2019-07-18 DIAGNOSIS — D649 Anemia, unspecified: Secondary | ICD-10-CM | POA: Diagnosis not present

## 2019-07-18 DIAGNOSIS — I5033 Acute on chronic diastolic (congestive) heart failure: Secondary | ICD-10-CM | POA: Diagnosis not present

## 2019-07-18 DIAGNOSIS — I161 Hypertensive emergency: Secondary | ICD-10-CM | POA: Diagnosis not present

## 2019-07-18 LAB — BASIC METABOLIC PANEL
Anion gap: 8 (ref 5–15)
BUN: 13 mg/dL (ref 6–20)
CO2: 31 mmol/L (ref 22–32)
Calcium: 8.5 mg/dL — ABNORMAL LOW (ref 8.9–10.3)
Chloride: 98 mmol/L (ref 98–111)
Creatinine, Ser: 1.34 mg/dL — ABNORMAL HIGH (ref 0.61–1.24)
GFR calc Af Amer: >60 mL/min (ref >60)
GFR calc non Af Amer: >60 mL/min (ref >60)
Glucose, Bld: 121 mg/dL — ABNORMAL HIGH (ref 70–99)
Potassium: 3.8 mmol/L (ref 3.5–5.1)
Sodium: 137 mmol/L (ref 135–145)

## 2019-07-18 LAB — CBC
HCT: 30.8 % — ABNORMAL LOW (ref 39.0–52.0)
Hemoglobin: 8.6 g/dL — ABNORMAL LOW (ref 13.0–17.0)
MCH: 21.2 pg — ABNORMAL LOW (ref 26.0–34.0)
MCHC: 27.9 g/dL — ABNORMAL LOW (ref 30.0–36.0)
MCV: 75.9 fL — ABNORMAL LOW (ref 80.0–100.0)
Platelets: 366 10*3/uL (ref 150–400)
RBC: 4.06 MIL/uL — ABNORMAL LOW (ref 4.22–5.81)
RDW: 20 % — ABNORMAL HIGH (ref 11.5–15.5)
WBC: 8.8 10*3/uL (ref 4.0–10.5)
nRBC: 0 % (ref 0.0–0.2)

## 2019-07-18 LAB — MAGNESIUM: Magnesium: 1.8 mg/dL (ref 1.7–2.4)

## 2019-07-18 LAB — GLUCOSE, CAPILLARY: Glucose-Capillary: 130 mg/dL — ABNORMAL HIGH (ref 70–99)

## 2019-07-18 MED ORDER — LISINOPRIL 5 MG PO TABS: 5.0000 mg | ORAL_TABLET | Freq: Every day | ORAL | 0 refills | Status: DC

## 2019-07-18 MED FILL — LISINOPRIL 5 MG TABS: 5 | 30 days supply | Qty: 30 | Fill #0

## 2019-07-18 NOTE — Discharge Summary (Signed)
Physician Discharge Summary  Benjamin [name] MRN:[id number] DOB: 07/31/1966 DOA: 07/16/2019  PCP: Fleming, [name] W, NP  Admit date: 07/16/2019 Discharge date: 07/18/2019  Admitted From: Home  Disposition:  Home   Recommendations for Outpatient Follow-up:  1. Follow up with CHF team in 1 week 2. CHF: Please obtain BMP in 1 week on new lisinopril     Home Health: None  Equipment/Devices: None  Discharge Condition: Good  CODE STATUS: FULL Diet recommendation: Low sodium  Brief/Interim Summary: Benjamin [name] is a 53 y.o. M with MO/OHS/RHF, OSA on CPAP, dCHF, pAF on Xarelto, DM, HTN and bleeding hemorrhoids requiring PRBC transfusion and admission x2 in Sept 2020 who presented with dyspnea and leg swelling.  The patient had had leg swelling, orthopnea and dyspnea develop one week ago, saw HF clinic, given Lasix 80 mg IV in the office, diuresed 1300cc, felt better for a day or so, then started to get worse.  In last few days, SOB, chest tightness, and leg swelling got worse.  Home weights up to 188.2kg from baseline 180-182 with paramedicine.  In the ER, Cr 1.2, BP 247/128 mmHg, tachypneic and CXR nondiagnostic.  He was started on IV lasix and admitted for CHF.        PRINCIPAL HOSPITAL DIAGNOSIS: Acute right heart failure    Discharge Diagnoses:   Probably right heart failure Presented with weight 6-8kg above dry weight.  Dyspneic, legs swollen.  CXR clear.  Started on diuretics.  Echo repeated, EF normal.  RHC in Sep showed pHTN 40/25, normal cardiac output.  Net negative 3.6L on admission, felt symptoms (dyspnea, swelling) completely resolved.  Cr slightly up and K stable.  Torsemide resumed, med adherence and daily weights stressed.  Has community paramedicine.    Hypertensive emergency Nitroglycerin used to bring BP down 25% first 24 hours.    Patient noted med nonadherence prior to admission.  Also he was noncompliant with CPAP here.    His home meds  were restarted and lisinopril was added.  BP improved. -Repeat BMP in 1 week on lisinopril   Obesity hypoventilation MO OSA CPAP adherence recommended  Paroxysmal atrial fibrillation Rate controlled.  On Xarelto.  Diabetes Glucoses good  History PE  Chronic blood loss from hemorrhoids anemia           Discharge Instructions  Discharge Instructions    Diet - low sodium heart healthy   Complete by: As directed    Discharge instructions   Complete by: As directed    From Dr. Danford: You were admitted for severe high blood pressure and fluid overload.  I agree with you, if you missed a couple doses of your medicines, this might have been the only cause. But even with restarting all your blood pressure medicines and getting the extra fluid off, your blood pressure was still a little high.  So for that reason, I have added a new blood pressure medicine, lisinopril.  Blood pressure regimen: Labetalol 200 mg twice daily Amlodipine 10 mg once daily Torsemide 100 mg twice daily (this is a diuretic but also a blood pressure helper)  And now: Lisinopril 5 mg once daily   Call Amy Clegg's office to see them, make sure she refills this medicine, and MAKE SURE she checks your kidney function in 1 week I have messaged her to ask her to do this  Resume your other home meds Weigh yourself daily.   I think your dry weight right now is closer to 409 lbs or   maybe a little less on your home scale.  Put your meds by your TV remote like we talked about, so you don't forget them I bet your paramedicine provider also would have some good ideas about how to remember your medicines  ALSO your CPAP at night is important for blood pressure Use it.   If you don't your blood pressure will go up   Increase activity slowly   Complete by: As directed      Allergies as of 07/18/2019   No Known Allergies     Medication List    STOP taking these medications    hydrocortisone 25 MG suppository Commonly known as: ANUSOL-HC     TAKE these medications   albuterol 108 (90 Base) MCG/ACT inhaler Commonly known as: VENTOLIN HFA Inhale 2 puffs into the lungs every 6 (six) hours as needed for wheezing or shortness of breath.   amLODipine 10 MG tablet Commonly known as: NORVASC Take 1 tablet (10 mg total) by mouth daily.   atorvastatin 20 MG tablet Commonly known as: LIPITOR Take 1 tablet (20 mg total) by mouth at bedtime.   carboxymethylcellulose 0.5 % Soln Commonly known as: REFRESH PLUS Place 1 drop into both eyes daily.   docusate sodium 100 MG capsule Commonly known as: COLACE Take 1 capsule (100 mg total) by mouth 2 (two) times daily.   ferrous sulfate 325 (65 FE) MG tablet Take 1 tablet (325 mg total) by mouth daily. Patient will pick up scripts today.   labetalol 200 MG tablet Commonly known as: NORMODYNE Take 1 tablet (200 mg total) by mouth 2 (two) times daily.   levalbuterol 45 MCG/ACT inhaler Commonly known as: XOPENEX HFA Inhale 2 puffs into the lungs every 4 (four) hours as needed for wheezing.   lisinopril 5 MG tablet Commonly known as: ZESTRIL Take 1 tablet (5 mg total) by mouth daily.   metFORMIN 500 MG tablet Commonly known as: GLUCOPHAGE Take 1 tablet (500 mg total) by mouth 2 (two) times daily with a meal. Patient will pick up scripts today.   Misc. Devices Misc Please provide Benjamin [name] with insurance approved portable O2 concentrator ICD 10 J96.11 Z99.81   Misc. Devices Misc Requires O2 @ 3L/min continuously via nasal canula and home fill system,.   omeprazole 20 MG capsule Commonly known as: PRILOSEC Take 1 capsule (20 mg total) by mouth daily.   polyethylene glycol 17 g packet Commonly known as: MIRALAX / GLYCOLAX Take 17 g by mouth daily as needed for severe constipation.   potassium chloride SA 20 MEQ tablet Commonly known as: KLOR-CON Take 2 tablets (40 mEq total) by mouth 2 (two) times  daily.   rivaroxaban 20 MG Tabs tablet Commonly known as: XARELTO Take 1 tablet (20 mg total) by mouth daily with supper.   torsemide 100 MG tablet Commonly known as: DEMADEX Take 1 tablet (100 mg total) by mouth 2 (two) times daily.   True Metrix Blood Glucose Test test strip Generic drug: glucose blood Use as instructed. Check blood glucose level by fingerstick twice per day.   True Metrix Meter w/Device Kit Use as instructed. Check blood glucose level by fingerstick twice per day.   TRUEplus Lancets 28G Misc Use as instructed. Check blood glucose level by fingerstick twice per day.      Follow-up Information    [facility]   Contact information: [address] [place] [address] [phone]  No Known Allergies  Consultations:  None   Procedures/Studies: Dg Chest Portable 1 View  Result Date: 07/16/2019 CLINICAL DATA:  CHF EXAM: PORTABLE CHEST 1 VIEW COMPARISON:  06/11/2019 FINDINGS: Cardiomegaly. Both lungs are clear. The visualized skeletal structures are unremarkable. IMPRESSION: Cardiomegaly without acute abnormality of the lungs in AP portable projection. Electronically Signed   By: Eddie Candle M.D.   On: 07/16/2019 18:01  Echo 10/28 IMPRESSIONS    1. Left ventricular ejection fraction, by visual estimation, is 65 to 70%. The left ventricle has normal function. Normal left ventricular size. Left ventricular septal wall thickness was mildly increased. Mildly increased left ventricular posterior  wall thickness. There is mildly increased left ventricular hypertrophy.  2. Intracavitary graident. Peak velocity 2.14 m/s. Peak gradient 18 mmHg.  3. Global right ventricle has normal systolic function.The right ventricular size is normal. No increase in right ventricular wall thickness.  4. Left atrial size was mildly dilated.  5. Right atrial size was normal.  6. The mitral valve is normal in  structure. No evidence of mitral valve regurgitation. No evidence of mitral stenosis.  7. The tricuspid valve is normal in structure. Tricuspid valve regurgitation is trivial.  8. The aortic valve was not well visualized Aortic valve regurgitation was not visualized by color flow Doppler. Structurally normal aortic valve, with no evidence of sclerosis or stenosis.  9. The pulmonic valve was normal in structure. Pulmonic valve regurgitation is not visualized by color flow Doppler. 10. Normal pulmonary artery systolic pressure. 11. The inferior vena cava is normal in size with <50% respiratory variability, suggesting right atrial pressure of 8 mmHg.        Subjective: Feeling well.  No swelling, no orthpnea.  Dyspnea is at his basleine.  No confusion, chest pain, wheezing, sputum, fever.  Discharge Exam: Vitals:   07/17/19 2115 07/18/19 0624  BP: (!) 143/81 (!) 188/82  Pulse: 85 85  Resp: 20 18  Temp: 98.5 F (36.9 C) 98.2 F (36.8 C)  SpO2: 93% 93%   Vitals:   07/17/19 1322 07/17/19 1609 07/17/19 2115 07/18/19 0624  BP: (!) 159/81 (!) 158/78 (!) 143/81 (!) 188/82  Pulse: 90 89 85 85  Resp:  _0 Temp:  99.2 F (37.3 C) 98.5 F (36.9 C) 98.2 F (36.8 C)  TempSrc:  Oral Oral Oral  SpO2: 92% 95% 93% 93%  Weight:    (!) 189.6 kg  Height:        General: Pt is alert, awake, not in acute distress Cardiovascular: RRR, no murmurs, but heart sounds impossible to auscultate given habitus.  JVP not visible.  Brawny change to legs, volume status otherwise not possible to estimate with habitus.   Respiratory: Normal respiratory rate and rhythm.  CTAB without rales or wheezes.  Air entry difficult to auscultate due to habitus. Abdominal: Abdomen soft and non-tender.  No distension or HSM.   Neuro/Psych: Strength symmetric in upper and lower extremities.  Judgment and insight appear normal.   The results of significant diagnostics from this hospitalization (including imaging,  microbiology, ancillary and laboratory) are listed below for reference.     Microbiology: Recent Results (from the past 240 hour(s))  SARS CORONAVIRUS 2 (TAT 6-24 HRS) Nasopharyngeal Nasopharyngeal Swab     Status: None   Collection Time: 07/16/19  5:32 PM   Specimen: Nasopharyngeal Swab  Result Value Ref Range Status   SARS Coronavirus 2 NEGATIVE NEGATIVE Final    Comment: (NOTE) SARS-CoV-2 target nucleic acids are  NOT DETECTED. The SARS-CoV-2 RNA is generally detectable in upper and lower respiratory specimens during the acute phase of infection. Negative results do not preclude SARS-CoV-2 infection, do not rule out co-infections with other pathogens, and should not be used as the sole basis for treatment or other patient management decisions. Negative results must be combined with clinical observations, patient history, and epidemiological information. The expected result is Negative. Fact Sheet for Patients: https://www.fda.gov/media/138098/download Fact Sheet for Healthcare Providers: https://www.fda.gov/media/138095/download This test is not yet approved or cleared by the [place] FDA and  has been authorized for detection and/or diagnosis of SARS-CoV-2 by FDA under an Emergency Use Authorization (EUA). This EUA will remain  in effect (meaning this test can be used) for the duration of the COVID-19 declaration under Section 56 4(b)(1) of the Act, 21 U.S.C. section 360bbb-3(b)(1), unless the authorization is terminated or revoked sooner. Performed at [facility], [address] [place], [place] [address]      Labs: BNP (last 3 results) Recent Labs    05/12/19 0140 05/28/19 1108 07/16/19 1730  BNP 12.8 25.1 29.4   Basic Metabolic Panel: Recent Labs  Lab 07/16/19 1730 07/17/19 0116 07/18/19 0537  NA 139 140 137  K 3.7 4.0 3.8  CL 99 97* 98  CO2 31 35* 31  GLUCOSE 114* 132* 121*  BUN 9 11 13  CREATININE 1.21 1.25* 1.34*  CALCIUM 9.2 9.2 8.5*   MG 1.8 1.8 1.8   Liver Function Tests: No results for input(s): AST, ALT, ALKPHOS, BILITOT, PROT, ALBUMIN in the last 168 hours. No results for input(s): LIPASE, AMYLASE in the last 168 hours. No results for input(s): AMMONIA in the last 168 hours. CBC: Recent Labs  Lab 07/16/19 1730 07/18/19 0537  WBC 9.9 8.8  HGB 8.6* 8.6*  HCT 29.9* 30.8*  MCV 75.9* 75.9*  PLT 343 366   Cardiac Enzymes: No results for input(s): CKTOTAL, CKMB, CKMBINDEX, TROPONINI in the last 168 hours. BNP: Invalid input(s): POCBNP CBG: Recent Labs  Lab 07/17/19 0025 07/17/19 1112 07/17/19 1606 07/17/19 2121 07/18/19 0623  GLUCAP 133* 150* 126* 131* 130*   D-Dimer No results for input(s): DDIMER in the last 72 hours. Hgb A1c No results for input(s): HGBA1C in the last 72 hours. Lipid Profile No results for input(s): CHOL, HDL, LDLCALC, TRIG, CHOLHDL, LDLDIRECT in the last 72 hours. Thyroid function studies No results for input(s): TSH, T4TOTAL, T3FREE, THYROIDAB in the last 72 hours.  Invalid input(s): FREET3 Anemia work up No results for input(s): VITAMINB12, FOLATE, FERRITIN, TIBC, IRON, RETICCTPCT in the last 72 hours. Urinalysis    Component Value Date/Time   COLORURINE STRAW (A) 04/13/2019 0044   APPEARANCEUR CLEAR 04/13/2019 0044   LABSPEC 1.009 04/13/2019 0044   PHURINE 5.0 04/13/2019 0044   GLUCOSEU NEGATIVE 04/13/2019 0044   HGBUR NEGATIVE 04/13/2019 0044   BILIRUBINUR NEGATIVE 04/13/2019 0044   KETONESUR NEGATIVE 04/13/2019 0044   PROTEINUR NEGATIVE 04/13/2019 0044   NITRITE NEGATIVE 04/13/2019 0044   LEUKOCYTESUR NEGATIVE 04/13/2019 0044   Sepsis Labs Invalid input(s): PROCALCITONIN,  WBC,  LACTICIDVEN Microbiology Recent Results (from the past 240 hour(s))  SARS CORONAVIRUS 2 (TAT 6-24 HRS) Nasopharyngeal Nasopharyngeal Swab     Status: None   Collection Time: 07/16/19  5:32 PM   Specimen: Nasopharyngeal Swab  Result Value Ref Range Status   SARS Coronavirus 2  NEGATIVE NEGATIVE Final    Comment: (NOTE) SARS-CoV-2 target nucleic acids are NOT DETECTED. The SARS-CoV-2 RNA is generally detectable in   upper and lower respiratory specimens during the acute phase of infection. Negative results do not preclude SARS-CoV-2 infection, do not rule out co-infections with other pathogens, and should not be used as the sole basis for treatment or other patient management decisions. Negative results must be combined with clinical observations, patient history, and epidemiological information. The expected result is Negative. Fact Sheet for Patients: https://www.fda.gov/media/138098/download Fact Sheet for Healthcare Providers: https://www.fda.gov/media/138095/download This test is not yet approved or cleared by the [place] FDA and  has been authorized for detection and/or diagnosis of SARS-CoV-2 by FDA under an Emergency Use Authorization (EUA). This EUA will remain  in effect (meaning this test can be used) for the duration of the COVID-19 declaration under Section 56 4(b)(1) of the Act, 21 U.S.C. section 360bbb-3(b)(1), unless the authorization is terminated or revoked sooner. Performed at [facility], [address] [place], [place] [address]      Time coordinating discharge: 40 minutes      SIGNED:   Christopher P Danford, MD  [facility] 07/18/2019, 8:33 AM    

## 2019-07-18 NOTE — Progress Notes (Signed)
CSW following patient through outpatient [employer].  CSW informed pt will be discharging today so met with patient to check in and make sure he had everything he needs.   Pt provided new medication through [facility] but has all other outpatient medications at home which [facility] helps him to manage.    CSW informed Clinical biochemist of pt discharge and she will go out to update pillbox.  CSW will continue to follow and assist as needed  [name], [facility] Desk#: [phone] Cell#: [phone]

## 2019-07-18 NOTE — TOC Initial Note (Signed)
Transition of Care [facility]) - Initial/Assessment Note    Patient Details  Name: Benjamin [name] MRN: [id number] Date of Birth: 1966-09-03  Transition of Care [facility]) CM/SW Contact:    Leone Haven, RN Phone Number: 07/18/2019, 8:50 AM  Clinical Narrative:                 Patient is for discharge to day, he lives in a [address], he is active with Paramedicine,  NCM informed [place] CSW with HF Team.  Patient states he can get transportation at [employer] and that he goes to [facility] and he has a follow up apt.  His medications are usually mailed to him, but TOC is filling meds today prior to dc.  Expected Discharge Plan: Home/Self Care([facility]) Barriers to Discharge: No Barriers Identified([facility])   Patient Goals and CMS Choice Patient states their goals for this hospitalization and ongoing recovery are:: stay alive   Choice offered to / list presented to : NA  Expected Discharge Plan and Services Expected Discharge Plan: Home/Self Care([facility]) In-house Referral: NA Discharge Planning Services: CM Consult Post Acute Care Choice: NA Living arrangements for the past 2 months: ([facility]) Expected Discharge Date: 07/18/19               DME Arranged: (NA)         HH Arranged: NA          Prior Living Arrangements/Services Living arrangements for the past 2 months: ([facility]) Lives with:: Other (Comment)([facility]) Patient language and need for interpreter reviewed:: Yes Do you feel safe going back to the place where you live?: Yes      Need for Family Participation in Patient Care: No (Comment) Care giver support system in place?: No (comment)   Criminal Activity/Legal Involvement Pertinent to Current Situation/Hospitalization: No - Comment as needed  Activities of Daily Living Home Assistive Devices/Equipment: None ADL Screening (condition at time of admission) Patient's cognitive ability adequate to safely complete daily  activities?: Yes Is the patient deaf or have difficulty hearing?: No Does the patient have difficulty seeing, even when wearing glasses/contacts?: No Does the patient have difficulty concentrating, remembering, or making decisions?: No Patient able to express need for assistance with ADLs?: No Does the patient have difficulty dressing or bathing?: No Independently performs ADLs?: Yes (appropriate for developmental age) Does the patient have difficulty walking or climbing stairs?: No Weakness of Legs: Both Weakness of Arms/Hands: None  Permission Sought/Granted                  Emotional Assessment   Attitude/Demeanor/Rapport: Engaged Affect (typically observed): Appropriate Orientation: : Oriented to Self, Oriented to Place, Oriented to  Time, Oriented to Situation Alcohol / Substance Use: Not Applicable Psych Involvement: No (comment)  Admission diagnosis:  Chronic anemia [D64.9] Acute on chronic diastolic congestive heart failure (HCC) [I50.33] Hypertensive emergency [I16.1] Patient Active Problem List   Diagnosis Date Noted  . Diabetes mellitus without complication (HCC) 07/16/2019  . Hypertensive emergency   . BRBPR (bright red blood per rectum) 06/12/2019  . Hypertension   . CHF (congestive heart failure) (HCC)   . Sleep apnea   . Chronic respiratory failure (HCC)   . Lower GI bleeding 06/11/2019  . Hypokalemia 05/29/2019  . GI bleed 05/29/2019  . Anemia 05/28/2019  . Acute on chronic diastolic CHF (congestive heart failure) (HCC) 04/11/2019  . Iron deficiency anemia 03/11/2019  . OSA on CPAP 03/11/2019  .  HLD (hyperlipidemia) 03/11/2019  . Elevated troponin 03/11/2019  . GERD (gastroesophageal reflux disease) 03/11/2019  . Rectal bleeding 02/20/2019  . Dyspnea 02/06/2019  . COVID-19 virus infection 02/06/2019  . Bilateral lower extremity edema   . Morbid obesity with BMI of 50.0-59.9, adult ([place])   . Chronic diastolic CHF (congestive heart failure) ([place])    . PAF (paroxysmal atrial fibrillation) ([place])   . PE (pulmonary thromboembolism) ([place]) 01/21/2019  . Hypertensive urgency 01/21/2019  . Diabetes mellitus type 2 in obese ([place]) 01/21/2019   PCP:  [name], NP Pharmacy:   [place], [place] - [address] [place] [address] Phone: [phone] Fax: [phone]     Social Determinants of Health (SDOH) Interventions    Readmission Risk Interventions Readmission Risk Prevention Plan 07/18/2019 06/13/2019 04/19/2019  Transportation Screening Complete Complete Complete  PCP or Specialist Appt within 3-5 Days - - Complete  HRI or [place] - - Complete  Social Work Consult for [place] Planning/Counseling - - Complete  Palliative Care Screening - - Not Applicable  Medication Review Press photographer) Complete Complete Complete  PCP or Specialist appointment within 3-5 days of discharge Complete Complete -  [place] or Home Care Consult Complete Complete -  SW Recovery Care/Counseling Consult Complete Complete -  Palliative Care Screening Not Applicable Not Applicable -  [place] Not Applicable Not Applicable -  Some recent data might be hidden

## 2019-07-18 NOTE — Progress Notes (Signed)
Paramedicine Encounter    Patient ID: Benjamin [name], male    DOB: Apr 28, 1966, 53 y.o.   MRN: [id number]   Patient Care Team: [name], NP as PCP - General (Nurse Practitioner) [name], MD as PCP - Cardiology (Cardiology) [name], LCSW as Social Worker (Licensed Clinical Social Worker)  Patient Active Problem List   Diagnosis Date Noted  . Diabetes mellitus without complication ([place]) 24/23/5361  . Hypertensive emergency   . BRBPR (bright red blood per rectum) 06/12/2019  . Hypertension   . CHF (congestive heart failure) ([place])   . Sleep apnea   . Chronic respiratory failure ([place])   . Lower GI bleeding 06/11/2019  . Hypokalemia 05/29/2019  . GI bleed 05/29/2019  . Anemia 05/28/2019  . Acute on chronic diastolic CHF (congestive heart failure) ([place]) 04/11/2019  . Iron deficiency anemia 03/11/2019  . OSA on CPAP 03/11/2019  . HLD (hyperlipidemia) 03/11/2019  . Elevated troponin 03/11/2019  . GERD (gastroesophageal reflux disease) 03/11/2019  . Rectal bleeding 02/20/2019  . Dyspnea 02/06/2019  . COVID-19 virus infection 02/06/2019  . Bilateral lower extremity edema   . Morbid obesity with BMI of 50.0-59.9, adult ([place])   . Chronic diastolic CHF (congestive heart failure) ([place])   . PAF (paroxysmal atrial fibrillation) ([place])   . PE (pulmonary thromboembolism) ([place]) 01/21/2019  . Hypertensive urgency 01/21/2019  . Diabetes mellitus type 2 in obese ([place]) 01/21/2019    Current Outpatient Medications:  .  albuterol (VENTOLIN HFA) 108 (90 Base) MCG/ACT inhaler, Inhale 2 puffs into the lungs every 6 (six) hours as needed for wheezing or shortness of breath., Disp: 18 g, Rfl: 0 .  amLODipine (NORVASC) 10 MG tablet, Take 1 tablet (10 mg total) by mouth daily., Disp: 90 tablet, Rfl: 1 .  atorvastatin (LIPITOR) 20 MG tablet, Take 1 tablet (20 mg total) by mouth at bedtime., Disp: 90 tablet, Rfl: 2 .  Blood Glucose Monitoring Suppl (TRUE METRIX METER) w/Device KIT, Use  as instructed. Check blood glucose level by fingerstick twice per day., Disp: 1 kit, Rfl: 0 .  carboxymethylcellulose (REFRESH PLUS) 0.5 % SOLN, Place 1 drop into both eyes daily. , Disp: , Rfl:  .  docusate sodium (COLACE) 100 MG capsule, Take 1 capsule (100 mg total) by mouth 2 (two) times daily., Disp: 180 capsule, Rfl: 1 .  ferrous sulfate 325 (65 FE) MG tablet, Take 1 tablet (325 mg total) by mouth daily. Patient will pick up scripts today., Disp: 90 tablet, Rfl: 0 .  glucose blood (TRUE METRIX BLOOD GLUCOSE TEST) test strip, Use as instructed. Check blood glucose level by fingerstick twice per day., Disp: 100 each, Rfl: 12 .  labetalol (NORMODYNE) 200 MG tablet, Take 1 tablet (200 mg total) by mouth 2 (two) times daily., Disp: 120 tablet, Rfl: 0 .  levalbuterol (XOPENEX HFA) 45 MCG/ACT inhaler, Inhale 2 puffs into the lungs every 4 (four) hours as needed for wheezing. , Disp: , Rfl:  .  lisinopril (ZESTRIL) 5 MG tablet, Take 1 tablet (5 mg total) by mouth daily., Disp: 30 tablet, Rfl: 0 .  metFORMIN (GLUCOPHAGE) 500 MG tablet, Take 1 tablet (500 mg total) by mouth 2 (two) times daily with a meal. Patient will pick up scripts today., Disp: 180 tablet, Rfl: 1 .  Misc. Devices MISC, Please provide [name] with insurance approved portable O2 concentrator ICD 10 J96.11 Z99.81, Disp: 1 each, Rfl: 0 .  Misc. Devices MISC, Requires O2 @ 3L/min continuously via  nasal canula and home fill system,., Disp: 1 each, Rfl: 0 .  omeprazole (PRILOSEC) 20 MG capsule, Take 1 capsule (20 mg total) by mouth daily., Disp: 30 capsule, Rfl: 3 .  polyethylene glycol (MIRALAX / GLYCOLAX) 17 g packet, Take 17 g by mouth daily as needed for severe constipation., Disp: 30 each, Rfl: 3 .  potassium chloride SA (K-DUR) 20 MEQ tablet, Take 2 tablets (40 mEq total) by mouth 2 (two) times daily., Disp: 360 tablet, Rfl: 3 .  rivaroxaban (XARELTO) 20 MG TABS tablet, Take 1 tablet (20 mg total) by mouth daily with supper., Disp:  60 tablet, Rfl: 0 .  torsemide (DEMADEX) 100 MG tablet, Take 1 tablet (100 mg total) by mouth 2 (two) times daily., Disp: 180 tablet, Rfl: 3 .  TRUEplus Lancets 28G MISC, Use as instructed. Check blood glucose level by fingerstick twice per day., Disp: 200 each, Rfl: 3 No Known Allergies    Social History   Socioeconomic History  . Marital status: Single    Spouse name: Not on file  . Number of children: Not on file  . Years of education: Not on file  . Highest education level: Not on file  Occupational History  . Not on file  Social Needs  . Financial resource strain: Not on file  . Food insecurity    Worry: Not on file    Inability: Not on file  . Transportation needs    Medical: Not on file    Non-medical: Not on file  Tobacco Use  . Smoking status: Former Research scientist (life sciences)  . Smokeless tobacco: Never Used  Substance and Sexual Activity  . Alcohol use: Not Currently  . Drug use: Not Currently  . Sexual activity: Not Currently  Lifestyle  . Physical activity    Days per week: Not on file    Minutes per session: Not on file  . Stress: Not on file  Relationships  . Social Herbalist on phone: Not on file    Gets together: Not on file    Attends religious service: Not on file    Active member of club or organization: Not on file    Attends meetings of clubs or organizations: Not on file    Relationship status: Not on file  . Intimate partner violence    Fear of current or ex partner: Not on file    Emotionally abused: Not on file    Physically abused: Not on file    Forced sexual activity: Not on file  Other Topics Concern  . Not on file  Social History Narrative  . Not on file    Physical Exam      Future Appointments  Date Time Provider [place]  07/26/2019 10:30 AM [facility] PA/NP [facility] None  08/05/2019 11:30 AM [facility] PA/NP [facility] None  08/14/2019  9:30 AM Charlott Rakes, MD [facility] None  08/19/2019 12:00 PM [name], MD  [facility] None  09/30/2019  1:30 PM [name], NP [facility] None     Came to see pt post hosp d/c for med rec and to take his 02 tanks to swap out at [facility].  Still waiting on paperwork from social security office for his confirmation on his SSN card.   He did take his afternoon dose of meds during our visit.  He was d/c this morning and he reports taking his morning dose today.  Lisinopril was added during this admission. That was added in his pill box.  He has f/u for next Friday at clinic.  His paperwork noted his dry weight may be closer to 409 rather than 398-400--maybe less on his home scales.   [name], [facility] Paramedic  07/18/19

## 2019-07-19 ENCOUNTER — Telehealth: Payer: Self-pay

## 2019-07-19 NOTE — Telephone Encounter (Signed)
Transition Care Management Follow-up Telephone Call  Date of discharge and from where: 07/18/2019 - [facility]   How have you been since you were released from the hospital? Stated that he is feeling much better.    Any questions or concerns? No questions/concerns reported. No bleeding reported.   Items Reviewed:  Did the pt receive and understand the discharge instructions provided? yes  Medications obtained and verified? He said that he has all medications, including new med, and is taking as ordered. Katie, EMT saw him yesterday and he said she has all of his meds set up for him,  No questions at this time. Now using [name] who will deliver  Any new allergies since your discharge? None reported.   Dietary orders reviewed? Trying to adhere to low sodium/ heart healthy diet and fluid restriction.  Do you have support at home? Lives in boarding house. [name], LCSW/HF clinic has been assisting him with locating an apartment. They looked at one this week and he is hopeful that he will be approved  Other (ie: DME, Home Health, etc)   [place],EMT  O2 from [facility] - currently using it at 3L/minute.  Said he uses it 80% of the time. He said that he needs the O2 and will need to walk more to qualify when he is in the office.   Has glucometer. Blood sugar this morning : 97. Said he checks blood sugar every other day.   Has scale - he reported weight 410 lbs this morning.   Has CPAP from [employer]. Uses nightly.   Functional Questionnaire: (I = Independent and D = Dependent) ADL's: independent with ADLs.  Stated that he is keeping his medications downstairs to remind himself to take them.   Follow up appointments reviewed:    PCP Hospital f/u appt confirmed? Has appointment with Dr Margarita Rana 08/14/2019 but would like to be seen sooner by PCP  [facility] f/u appt confirmed? Cardiology - 07/26/2019. Is followed by Heart Failure  clinic  Are transportation arrangements needed? No, he said that he is using [facility].   Also eligible for medicaid transportation.   If their condition worsens, is the pt aware to call  their PCP or go to the ED? yes  Was the patient provided with contact information for the PCP's office or ED? He has the clinic phone number   Was the pt encouraged to call back with questions or concerns? yes

## 2019-07-23 ENCOUNTER — Other Ambulatory Visit (HOSPITAL_COMMUNITY): Payer: Self-pay

## 2019-07-23 NOTE — Progress Notes (Signed)
Paramedicine Encounter    Patient ID: Benjamin [name], male    DOB: Apr 28, 1966, 53 y.o.   MRN: [id number]   Patient Care Team: [name], NP as PCP - General (Nurse Practitioner) [name], MD as PCP - Cardiology (Cardiology) [name], LCSW as Social Worker (Licensed Clinical Social Worker)  Patient Active Problem List   Diagnosis Date Noted  . Diabetes mellitus without complication ([place]) 24/23/5361  . Hypertensive emergency   . BRBPR (bright red blood per rectum) 06/12/2019  . Hypertension   . CHF (congestive heart failure) ([place])   . Sleep apnea   . Chronic respiratory failure ([place])   . Lower GI bleeding 06/11/2019  . Hypokalemia 05/29/2019  . GI bleed 05/29/2019  . Anemia 05/28/2019  . Acute on chronic diastolic CHF (congestive heart failure) ([place]) 04/11/2019  . Iron deficiency anemia 03/11/2019  . OSA on CPAP 03/11/2019  . HLD (hyperlipidemia) 03/11/2019  . Elevated troponin 03/11/2019  . GERD (gastroesophageal reflux disease) 03/11/2019  . Rectal bleeding 02/20/2019  . Dyspnea 02/06/2019  . COVID-19 virus infection 02/06/2019  . Bilateral lower extremity edema   . Morbid obesity with BMI of 50.0-59.9, adult ([place])   . Chronic diastolic CHF (congestive heart failure) ([place])   . PAF (paroxysmal atrial fibrillation) ([place])   . PE (pulmonary thromboembolism) ([place]) 01/21/2019  . Hypertensive urgency 01/21/2019  . Diabetes mellitus type 2 in obese ([place]) 01/21/2019    Current Outpatient Medications:  .  albuterol (VENTOLIN HFA) 108 (90 Base) MCG/ACT inhaler, Inhale 2 puffs into the lungs every 6 (six) hours as needed for wheezing or shortness of breath., Disp: 18 g, Rfl: 0 .  amLODipine (NORVASC) 10 MG tablet, Take 1 tablet (10 mg total) by mouth daily., Disp: 90 tablet, Rfl: 1 .  atorvastatin (LIPITOR) 20 MG tablet, Take 1 tablet (20 mg total) by mouth at bedtime., Disp: 90 tablet, Rfl: 2 .  Blood Glucose Monitoring Suppl (TRUE METRIX METER) w/Device KIT, Use  as instructed. Check blood glucose level by fingerstick twice per day., Disp: 1 kit, Rfl: 0 .  carboxymethylcellulose (REFRESH PLUS) 0.5 % SOLN, Place 1 drop into both eyes daily. , Disp: , Rfl:  .  docusate sodium (COLACE) 100 MG capsule, Take 1 capsule (100 mg total) by mouth 2 (two) times daily., Disp: 180 capsule, Rfl: 1 .  ferrous sulfate 325 (65 FE) MG tablet, Take 1 tablet (325 mg total) by mouth daily. Patient will pick up scripts today., Disp: 90 tablet, Rfl: 0 .  glucose blood (TRUE METRIX BLOOD GLUCOSE TEST) test strip, Use as instructed. Check blood glucose level by fingerstick twice per day., Disp: 100 each, Rfl: 12 .  labetalol (NORMODYNE) 200 MG tablet, Take 1 tablet (200 mg total) by mouth 2 (two) times daily., Disp: 120 tablet, Rfl: 0 .  levalbuterol (XOPENEX HFA) 45 MCG/ACT inhaler, Inhale 2 puffs into the lungs every 4 (four) hours as needed for wheezing. , Disp: , Rfl:  .  lisinopril (ZESTRIL) 5 MG tablet, Take 1 tablet (5 mg total) by mouth daily., Disp: 30 tablet, Rfl: 0 .  metFORMIN (GLUCOPHAGE) 500 MG tablet, Take 1 tablet (500 mg total) by mouth 2 (two) times daily with a meal. Patient will pick up scripts today., Disp: 180 tablet, Rfl: 1 .  Misc. Devices MISC, Please provide [name] with insurance approved portable O2 concentrator ICD 10 J96.11 Z99.81, Disp: 1 each, Rfl: 0 .  Misc. Devices MISC, Requires O2 @ 3L/min continuously via  nasal canula and home fill system,., Disp: 1 each, Rfl: 0 .  omeprazole (PRILOSEC) 20 MG capsule, Take 1 capsule (20 mg total) by mouth daily., Disp: 30 capsule, Rfl: 3 .  polyethylene glycol (MIRALAX / GLYCOLAX) 17 g packet, Take 17 g by mouth daily as needed for severe constipation., Disp: 30 each, Rfl: 3 .  rivaroxaban (XARELTO) 20 MG TABS tablet, Take 1 tablet (20 mg total) by mouth daily with supper., Disp: 60 tablet, Rfl: 0 .  torsemide (DEMADEX) 100 MG tablet, Take 1 tablet (100 mg total) by mouth 2 (two) times daily., Disp: 180 tablet,  Rfl: 3 .  TRUEplus Lancets 28G MISC, Use as instructed. Check blood glucose level by fingerstick twice per day., Disp: 200 each, Rfl: 3 .  potassium chloride SA (K-DUR) 20 MEQ tablet, Take 2 tablets (40 mEq total) by mouth 2 (two) times daily., Disp: 360 tablet, Rfl: 3 No Known Allergies    Social History   Socioeconomic History  . Marital status: Single    Spouse name: Not on file  . Number of children: Not on file  . Years of education: Not on file  . Highest education level: Not on file  Occupational History  . Not on file  Social Needs  . Financial resource strain: Not on file  . Food insecurity    Worry: Not on file    Inability: Not on file  . Transportation needs    Medical: Not on file    Non-medical: Not on file  Tobacco Use  . Smoking status: Former Research scientist (life sciences)  . Smokeless tobacco: Never Used  Substance and Sexual Activity  . Alcohol use: Not Currently  . Drug use: Not Currently  . Sexual activity: Not Currently  Lifestyle  . Physical activity    Days per week: Not on file    Minutes per session: Not on file  . Stress: Not on file  Relationships  . Social Herbalist on phone: Not on file    Gets together: Not on file    Attends religious service: Not on file    Active member of club or organization: Not on file    Attends meetings of clubs or organizations: Not on file    Relationship status: Not on file  . Intimate partner violence    Fear of current or ex partner: Not on file    Emotionally abused: Not on file    Physically abused: Not on file    Forced sexual activity: Not on file  Other Topics Concern  . Not on file  Social History Narrative  . Not on file    Physical Exam      Future Appointments  Date Time Provider [place]  07/26/2019 10:30 AM [facility] PA/NP [facility] None  08/05/2019 11:30 AM [facility] PA/NP [facility] None  08/14/2019  9:30 AM Charlott Rakes, MD [facility] None  08/19/2019 12:00 PM [name], MD  [facility] None  09/30/2019  1:30 PM [name], NP [facility] None    BP (!) 170/0   Pulse 94   Temp 97.8 F (36.6 C)   Resp 20   Wt (!) 411 lb (186.4 kg)   SpO2 98%   BMI 57.32 kg/m   Weight yesterday-412  Pt reports he is doing ok.  He is approved for [employer] transportation.  He did not qualify for 02 per the walk test he did at [facility] PCP. The nurse stopped him at 88% and it needed to be  lower than that to be approved for home 02.  He reports his leg is still swelling up and down. He repots it is painful also.  He said he called a "leg doctor"  He cant remember who it was. Pt said he was told he could come today if he wanted to for a walk in--he said the office was near hosp and I think he is talking about the sports med office.  He said he has been having problems in his left side such as burning, weakness and pain from hip down.  He has a knot behind/side his left knee.  He does not have weakness to his arms/face or facial issues for stroke symptoms. His weight is up and its primarily in his left leg. That left leg is obviously larger than the right. He had episode last Friday where he used a lot of 02 b/c he got so sob. He is going to see sports doc today after appointment.  He reports he took 2 AM doses of his meds on the wknd-he said he couldn't stop having BM that time as well. He did it intentionally b/c he wasn't feeling good. He doesn't want to take his meds today b/c he is still urinating a lot.  --called in [place] for refills and linisopril but pharmacy does not have it--neither the proair inhaler.  Unable to reach anyone in [place] to get it moved to [place], I sent jane message asking for assistance as well.   B/p elevated, he did not take his morning meds yet. He did take his pills except his fluid pills and potassium-he will take them when he returns.  --[facility] advised that [facility] to get the meds transferred over.   [name],  [facility] Paramedic  07/23/19

## 2019-07-24 ENCOUNTER — Telehealth (HOSPITAL_COMMUNITY): Payer: Self-pay | Admitting: *Deleted

## 2019-07-24 NOTE — Telephone Encounter (Signed)
Last week - Walk to help with managing his frustrations either to prevent frustration or to cope - Walk outside daily for 15 minutes (weather permitting and when he is allowed) to pick back up where he was before he had the bleeding - Report back next week about how much he did this and his affect on his frustration  - Add: One longer exercise duration outside (30 minutes) - Add: One extra trip upstairs   Progress Note: - Benjamin [name] had clinical symptoms in the last week with hypertension and dizziness and leg swelling - He was admitted to the hospital for 3 days and missed appointment last week due to that admission - Patient is struggling to manage blood pressure - Patient states he has a swollen/hardened area on the back of his left leg which is limiting him - He says he is wearing his oxygen more and depending on it more now and it is depleting faster  - He says his chest tightens when he is walking - He continues to state he wants to participate in a group exercise program   Achievements: - He says he has been doing chair exercises - He is doing chair raises, leg raises, arm circles and side bends (3-4 at a time) 3x day   Challenges/Frustrations: - Health set-backs - Legs swelling and hurting and limiting his walking/mobility - He cannot walk from one side of [employer] to the other     New Weekly Goal (s): - Follow-up with HF Clinic next week in clinic.  - Continue exercises in chair  - RCEP EW Coach will investigate PREP referral eligibility     Next Session:  Next Clinic Appointment: RCEP EW will see patient in clinic visit Tuesday 11/10 at 12pm.     **RCEP discussed with CSW and NP in heart failure clinic about transportation if enrolled in the PREP program as patient is nearing candidacy and necessity for PREP program.     Benjamin Martins, MS, ACSM-RCEP Clinical Exercise Physiologist

## 2019-07-25 ENCOUNTER — Telehealth (HOSPITAL_COMMUNITY): Payer: Self-pay | Admitting: Licensed Clinical Social Worker

## 2019-07-25 NOTE — Telephone Encounter (Signed)
CSW received call from pt informing CSW that he has been denied the apartment with [place] attempted to call their leasing office multiple times to discuss but sounds like he was denied due to criminal record.  CSW continuing to pursue other housing options- unable to complete some applications due to lack of pt Social Security card- [employer] case worker helped him officially order a new one yesterday- should arrive in 10 business days  CSW made referral to [employer]- awaiting response.  CSW called [facility] to inquire about applying with their facility.  CSW will continue to follow and assist as needed  Benjamin [name], [facility] Desk#: [phone] Cell#: [phone]

## 2019-07-26 ENCOUNTER — Encounter (HOSPITAL_COMMUNITY): Payer: Medicaid Other

## 2019-07-29 ENCOUNTER — Telehealth (HOSPITAL_COMMUNITY): Payer: Self-pay | Admitting: Licensed Clinical Social Worker

## 2019-07-29 NOTE — Telephone Encounter (Signed)
CSW continuing to work with patient regarding housing and other concerns.  Patient has been getting social security but received notice this would be stopping as he is in a facility that has all of his expenses provided for.  CSW assisting pt with appealing this decision as he will be leaving the facility in December and will need those funds to get alternative housing.  CSW spoke with pt parole officer who is trying to find other housing options as well- no significant leads at this time.  Patient is going on 4 house tours today to see what he can find- hopeful he can move out quickly so we can have his checks restarted.  CSW will continue to follow and assist as needed  [name], [facility] Desk#: [phone] Cell#: [phone]

## 2019-07-30 ENCOUNTER — Other Ambulatory Visit: Payer: Self-pay

## 2019-07-30 ENCOUNTER — Ambulatory Visit (HOSPITAL_COMMUNITY)
Admission: RE | Admit: 2019-07-30 | Discharge: 2019-07-30 | Disposition: A | Discharge status: Home / Self Care | Payer: Medicaid Other | Source: Ambulatory Visit | Attending: Cardiology | Admitting: Cardiology

## 2019-07-30 ENCOUNTER — Inpatient Hospital Stay (HOSPITAL_COMMUNITY)
Admission: AD | Admit: 2019-07-30 | Discharge: 2019-08-02 | DRG: 291 | Disposition: A | Discharge status: Home / Self Care | Payer: Medicaid Other | Source: Ambulatory Visit | Attending: Cardiology | Admitting: Cardiology

## 2019-07-30 ENCOUNTER — Inpatient Hospital Stay: Payer: Self-pay

## 2019-07-30 ENCOUNTER — Other Ambulatory Visit (HOSPITAL_COMMUNITY): Payer: Self-pay

## 2019-07-30 ENCOUNTER — Encounter (HOSPITAL_COMMUNITY): Payer: Self-pay | Admitting: General Practice

## 2019-07-30 ENCOUNTER — Encounter (HOSPITAL_COMMUNITY): Payer: Self-pay

## 2019-07-30 VITALS — BP 168/85 | HR 93 | Wt >= 6400 oz

## 2019-07-30 DIAGNOSIS — K921 Melena: Secondary | ICD-10-CM | POA: Insufficient documentation

## 2019-07-30 DIAGNOSIS — Z86711 Personal history of pulmonary embolism: Secondary | ICD-10-CM

## 2019-07-30 DIAGNOSIS — Z79899 Other long term (current) drug therapy: Secondary | ICD-10-CM | POA: Insufficient documentation

## 2019-07-30 DIAGNOSIS — Z7984 Long term (current) use of oral hypoglycemic drugs: Secondary | ICD-10-CM | POA: Insufficient documentation

## 2019-07-30 DIAGNOSIS — Z833 Family history of diabetes mellitus: Secondary | ICD-10-CM

## 2019-07-30 DIAGNOSIS — J9610 Chronic respiratory failure, unspecified whether with hypoxia or hypercapnia: Secondary | ICD-10-CM | POA: Diagnosis not present

## 2019-07-30 DIAGNOSIS — Z6841 Body Mass Index (BMI) 40.0 and over, adult: Secondary | ICD-10-CM

## 2019-07-30 DIAGNOSIS — E1122 Type 2 diabetes mellitus with diabetic chronic kidney disease: Secondary | ICD-10-CM | POA: Diagnosis present

## 2019-07-30 DIAGNOSIS — Z87891 Personal history of nicotine dependence: Secondary | ICD-10-CM

## 2019-07-30 DIAGNOSIS — I130 Hypertensive heart and chronic kidney disease with heart failure and stage 1 through stage 4 chronic kidney disease, or unspecified chronic kidney disease: Principal | ICD-10-CM | POA: Diagnosis present

## 2019-07-30 DIAGNOSIS — Z7901 Long term (current) use of anticoagulants: Secondary | ICD-10-CM | POA: Insufficient documentation

## 2019-07-30 DIAGNOSIS — Z8249 Family history of ischemic heart disease and other diseases of the circulatory system: Secondary | ICD-10-CM

## 2019-07-30 DIAGNOSIS — I480 Paroxysmal atrial fibrillation: Secondary | ICD-10-CM | POA: Diagnosis not present

## 2019-07-30 DIAGNOSIS — I5032 Chronic diastolic (congestive) heart failure: Secondary | ICD-10-CM

## 2019-07-30 DIAGNOSIS — D631 Anemia in chronic kidney disease: Secondary | ICD-10-CM | POA: Diagnosis not present

## 2019-07-30 DIAGNOSIS — I5033 Acute on chronic diastolic (congestive) heart failure: Secondary | ICD-10-CM | POA: Diagnosis present

## 2019-07-30 DIAGNOSIS — K219 Gastro-esophageal reflux disease without esophagitis: Secondary | ICD-10-CM | POA: Diagnosis present

## 2019-07-30 DIAGNOSIS — E662 Morbid (severe) obesity with alveolar hypoventilation: Secondary | ICD-10-CM | POA: Diagnosis present

## 2019-07-30 DIAGNOSIS — N182 Chronic kidney disease, stage 2 (mild): Secondary | ICD-10-CM | POA: Diagnosis present

## 2019-07-30 DIAGNOSIS — Z8619 Personal history of other infectious and parasitic diseases: Secondary | ICD-10-CM

## 2019-07-30 DIAGNOSIS — G4733 Obstructive sleep apnea (adult) (pediatric): Secondary | ICD-10-CM | POA: Insufficient documentation

## 2019-07-30 DIAGNOSIS — Z8719 Personal history of other diseases of the digestive system: Secondary | ICD-10-CM | POA: Insufficient documentation

## 2019-07-30 DIAGNOSIS — D649 Anemia, unspecified: Secondary | ICD-10-CM | POA: Insufficient documentation

## 2019-07-30 DIAGNOSIS — Z7689 Persons encountering health services in other specified circumstances: Secondary | ICD-10-CM | POA: Diagnosis not present

## 2019-07-30 HISTORY — DX: Dyspnea, unspecified: R06.00

## 2019-07-30 LAB — COMPREHENSIVE METABOLIC PANEL
ALT: 14 U/L (ref 0–44)
AST: 18 U/L (ref 15–41)
Albumin: 3.5 g/dL (ref 3.5–5.0)
Alkaline Phosphatase: 47 U/L (ref 38–126)
Anion gap: 9 (ref 5–15)
BUN: 8 mg/dL (ref 6–20)
CO2: 29 mmol/L (ref 22–32)
Calcium: 8.8 mg/dL — ABNORMAL LOW (ref 8.9–10.3)
Chloride: 100 mmol/L (ref 98–111)
Creatinine, Ser: 1 mg/dL (ref 0.61–1.24)
GFR calc Af Amer: >60 mL/min (ref >60)
GFR calc non Af Amer: >60 mL/min (ref >60)
Glucose, Bld: 105 mg/dL — ABNORMAL HIGH (ref 70–99)
Potassium: 3.5 mmol/L (ref 3.5–5.1)
Sodium: 138 mmol/L (ref 135–145)
Total Bilirubin: 0.4 mg/dL (ref 0.3–1.2)
Total Protein: 7.1 g/dL (ref 6.5–8.1)

## 2019-07-30 LAB — SARS CORONAVIRUS 2 (TAT 6-24 HRS): SARS Coronavirus 2: NEGATIVE

## 2019-07-30 LAB — CBC
HCT: 33.4 % — ABNORMAL LOW (ref 39.0–52.0)
Hemoglobin: 9.3 g/dL — ABNORMAL LOW (ref 13.0–17.0)
MCH: 21.2 pg — ABNORMAL LOW (ref 26.0–34.0)
MCHC: 27.8 g/dL — ABNORMAL LOW (ref 30.0–36.0)
MCV: 76.3 fL — ABNORMAL LOW (ref 80.0–100.0)
Platelets: 433 10*3/uL — ABNORMAL HIGH (ref 150–400)
RBC: 4.38 MIL/uL (ref 4.22–5.81)
RDW: 19.9 % — ABNORMAL HIGH (ref 11.5–15.5)
WBC: 8.3 10*3/uL (ref 4.0–10.5)
nRBC: 0 % (ref 0.0–0.2)

## 2019-07-30 LAB — TSH: TSH: 2.293 u[IU]/mL (ref 0.350–4.500)

## 2019-07-30 LAB — BRAIN NATRIURETIC PEPTIDE: B Natriuretic Peptide: 39.3 pg/mL (ref 0.0–100.0)

## 2019-07-30 MED ORDER — SODIUM CHLORIDE 0.9 % IV SOLN: 250.0000 mL | INTRAVENOUS | Status: DC | PRN

## 2019-07-30 MED ORDER — FERROUS SULFATE 325 (65 FE) MG PO TABS
325.0000 mg | ORAL_TABLET | Freq: Every day | ORAL | Status: DC
  Administered 2019-07-30 – 2019-08-02 (×4): 325 mg via ORAL
  Filled 2019-07-30 (×4): qty 1

## 2019-07-30 MED ORDER — ALBUTEROL SULFATE (2.5 MG/3ML) 0.083% IN NEBU
2.5000 mg | INHALATION_SOLUTION | Freq: Four times a day (QID) | RESPIRATORY_TRACT | Status: DC | PRN
  Administered 2019-07-30: 2.5 mg via RESPIRATORY_TRACT
  Filled 2019-07-30: qty 3

## 2019-07-30 MED ORDER — PANTOPRAZOLE SODIUM 40 MG PO TBEC
40.0000 mg | DELAYED_RELEASE_TABLET | Freq: Every day | ORAL | Status: DC
  Administered 2019-07-30 – 2019-08-02 (×4): 40 mg via ORAL
  Filled 2019-07-30 (×4): qty 1

## 2019-07-30 MED ORDER — SODIUM CHLORIDE 0.9% FLUSH: 3.0000 mL | INTRAVENOUS | Status: DC | PRN

## 2019-07-30 MED ORDER — METOLAZONE 2.5 MG PO TABS
2.5000 mg | ORAL_TABLET | Freq: Once | ORAL | Status: AC
  Administered 2019-07-30: 2.5 mg via ORAL
  Filled 2019-07-30: qty 1

## 2019-07-30 MED ORDER — LEVALBUTEROL TARTRATE 45 MCG/ACT IN AERO: 2.0000 | INHALATION_SPRAY | RESPIRATORY_TRACT | Status: DC | PRN

## 2019-07-30 MED ORDER — AMLODIPINE BESYLATE 10 MG PO TABS
10.0000 mg | ORAL_TABLET | Freq: Every day | ORAL | Status: DC
  Administered 2019-07-31 – 2019-08-02 (×3): 10 mg via ORAL
  Filled 2019-07-30 (×2): qty 1

## 2019-07-30 MED ORDER — RIVAROXABAN 20 MG PO TABS
20.0000 mg | ORAL_TABLET | Freq: Every day | ORAL | Status: DC
  Administered 2019-07-30 – 2019-08-01 (×3): 20 mg via ORAL
  Filled 2019-07-30 (×3): qty 1

## 2019-07-30 MED ORDER — DOCUSATE SODIUM 100 MG PO CAPS
100.0000 mg | ORAL_CAPSULE | Freq: Two times a day (BID) | ORAL | Status: DC
  Administered 2019-07-30 – 2019-08-02 (×6): 100 mg via ORAL
  Filled 2019-07-30 (×6): qty 1

## 2019-07-30 MED ORDER — POTASSIUM CHLORIDE CRYS ER 20 MEQ PO TBCR
40.0000 meq | EXTENDED_RELEASE_TABLET | Freq: Two times a day (BID) | ORAL | Status: DC
  Administered 2019-07-30 – 2019-07-31 (×2): 40 meq via ORAL
  Filled 2019-07-30 (×2): qty 2

## 2019-07-30 MED ORDER — SODIUM CHLORIDE 0.9% FLUSH
3.0000 mL | Freq: Two times a day (BID) | INTRAVENOUS | Status: DC
  Administered 2019-07-30 – 2019-08-01 (×5): 3 mL via INTRAVENOUS

## 2019-07-30 MED ORDER — HYDRALAZINE HCL 20 MG/ML IJ SOLN: 10.0000 mg | INTRAMUSCULAR | Status: DC | PRN

## 2019-07-30 MED ORDER — ALBUTEROL SULFATE HFA 108 (90 BASE) MCG/ACT IN AERS: 2.0000 | INHALATION_SPRAY | Freq: Four times a day (QID) | RESPIRATORY_TRACT | Status: DC | PRN

## 2019-07-30 MED ORDER — LISINOPRIL 5 MG PO TABS
5.0000 mg | ORAL_TABLET | Freq: Every day | ORAL | Status: DC
  Administered 2019-07-30 – 2019-07-31 (×2): 5 mg via ORAL
  Filled 2019-07-30 (×2): qty 1

## 2019-07-30 MED ORDER — LABETALOL HCL 200 MG PO TABS
200.0000 mg | ORAL_TABLET | Freq: Two times a day (BID) | ORAL | Status: DC
  Administered 2019-07-30 – 2019-08-01 (×5): 200 mg via ORAL
  Filled 2019-07-30 (×5): qty 1

## 2019-07-30 MED ORDER — FUROSEMIDE 10 MG/ML IJ SOLN
80.0000 mg | Freq: Two times a day (BID) | INTRAMUSCULAR | Status: DC
  Administered 2019-07-30 – 2019-08-02 (×6): 80 mg via INTRAVENOUS
  Filled 2019-07-30 (×5): qty 8

## 2019-07-30 MED ORDER — ATORVASTATIN CALCIUM 10 MG PO TABS
20.0000 mg | ORAL_TABLET | Freq: Every day | ORAL | Status: DC
  Administered 2019-07-30 – 2019-08-01 (×3): 20 mg via ORAL
  Filled 2019-07-30 (×3): qty 2

## 2019-07-30 MED ORDER — ACETAMINOPHEN 325 MG PO TABS
650.0000 mg | ORAL_TABLET | ORAL | Status: DC | PRN
  Administered 2019-07-31: 650 mg via ORAL

## 2019-07-30 NOTE — Progress Notes (Signed)
CSW met with pt during clinic appt.  CSW continuing to assist with housing applications ( had patient sign multiple) and with an appeal to social security to continuing paying him his SSI each month despite living in a facility that is being paid for.  CSW completed appeal and mailed to Social Security for review.  Patient being admitted to the hospital for fluid overload concerns- will continue to follow and assist as needed  [name], [place] Worker [facility] Desk#: [phone] Cell#: [phone]

## 2019-07-30 NOTE — Progress Notes (Addendum)
Paged cardiology regarding patient elevated BP and order need for CPAP.   Cardiology returned page and stated to give labetalol now and recheck BP in 2 hours prior to giving any PRNs now ordered.   Also placed order for CPAP.

## 2019-07-30 NOTE — Progress Notes (Signed)
Paramedicine Encounter   Patient ID: Benjamin [name] , male,   DOB: 25-May-1966,53 y.o.,  MRN: [id number]   Met patient in clinic today with provider.    B/p-168/85 p-93 sp02-97% Weight @ clinic -428  Pt in clinic today, his weight is up, he looks very swollen--he said the past few days he has felt very bad--swollen on the left side. He is puffy in his chest, shoulders and even face.  He feels sob, he has gotten diaphoretic sitting here in clinic.  Dr Aundra Dubin is going to admit for diuresis.   Will follow once he is d/c. I have his 02 tank and his bag of meds and will return on Thursday to the dismoth center.   [name], [place] 07/30/2019

## 2019-07-30 NOTE — Addendum Note (Signed)
Encounter addended by: [name], LCSW on: 07/30/2019 1:58 PM  Actions taken: Clinical Note Signed

## 2019-07-30 NOTE — Progress Notes (Signed)
Orthopedic Tech Progress Note Patient Details:  Benjamin [name] 1966/09/07 [id number]  Ortho Devices Type of Ortho Device: Ace wrap Ortho Device/Splint Location: (B)LE Ortho Device/Splint Interventions: Ordered, Application   Post Interventions Patient Tolerated: Well Instructions Provided: Care of device   Braulio Bosch 07/30/2019, 3:48 PM

## 2019-07-30 NOTE — Addendum Note (Signed)
Encounter addended by: Scarlette Calico, RN on: 07/30/2019 2:29 PM  Actions taken: LDA properties accepted, Flowsheet accepted, Order list changed, Diagnosis association updated, Clinical Note Signed

## 2019-07-30 NOTE — Progress Notes (Signed)
PCP: Juluis Mire NP Primary HF Cardiologist: Dr Aundra Dubin  GI: Dr Paulita Fujita   HPI: Benjamin [name] is a 53 yo with hx of diastolic CHF, HTN, DM, Atrial fib, PE in April 2020 (on Xarelto), OSA, GERD,chronic elevation of troponin.    Admitted 01/21/2019 with increased dyspnea and had PE. He was placed on back on coumadin .  Patient re-admitted 5/20 for syncope/cough/dyspnea, COVID+, CT positive for bilateral GGOs, admitted to [facility] and discharged after 6 days, on O2. Rec'd Cards f/u.  Patient re-admitted 02/20/19 for bright red blood per rectum, eval'ed by GI, had EGD/colon, dx'd with hemorrhoidal bleeding, discharged after 9 days on O2. He was again Covid + .  Readmitted 03/11/19 with increased shortness of breath. He did not have home oxygen. He was discharged to home the next day.   He presented to [facility] ED 04/11/19 with lower extremity edema . COVID negative. Diuresed with lasix drip and transitioned to torsemide 40 mg twice daily. Had RHC as noted below with mild volume overload and preserved cardiac output. Discharge weight was 407.9 pounds.   He was readmitted with symptomatic anemia 9/8 with hgb 7.1. Received 1UPRBCs. GI consulted. He started on anusol and continued on stool softner.   He had f/u visit 10/20 and was complaining of increased leg edema and weight gain.  SOB with exertion. Denied PND. + Orthopnea.  No bleeding issues. Was eating high sodium food --> [employer] chicken. No fever or chills. Taking all medications. Followed closely by HF Paramedicine. He was given a dose of IV Lasix in clinic, 80 mg w/ > 1300 urine output after IV lasix. His home torsemide dose was increased to 100 bid and he was instructed to f/u in 2 weeks.    Unfortunately, he was readmitted to the hospital on 10/27 when he presented to the ED w/ worsening symptoms and was admitted by IM for a/c dCHF and treated w/ IV Lasix. Had 2 day hospital stay and was discharged on 10/29. Was placed back on home diuretic regimen,  torsemide 100 mg bid. Lisinopril 5 mg was also added to regimen. AHF team was not consulted.   He presents back to clinic today for f/u. Feels poorly. SOB at rest and worse w/ exertion. Also w/ orthopnea/PND. Notes significant wt gain, up to 428 lb (dry wt ~407 lb). With increased LEE and abdominal distention. Looks uncomfortable.   Echo 01/2019:EF 60-65%, grade I DD  RHC 04/17/2019  RA mean 12 RV 37/18 PA 40/25, mean 31 PCWP mean 23 Oxygen saturations: PA 64% AO 100% Cardiac Output (Fick) 7.07  Cardiac Index (Fick) 2.47 PVR 1.1 WU  ROS: All systems negative except as listed in HPI, PMH and Problem List.  SH:  Social History   Socioeconomic History   Marital status: Single    Spouse name: Not on file   Number of children: Not on file   Years of education: Not on file   Highest education level: Not on file  Occupational History   Not on file  Social Needs   Financial resource strain: Not on file   Food insecurity    Worry: Not on file    Inability: Not on file   Transportation needs    Medical: Not on file    Non-medical: Not on file  Tobacco Use   Smoking status: Former Smoker   Smokeless tobacco: Never Used  Substance and Sexual Activity   Alcohol use: Not Currently   Drug use: Not Currently   Sexual  activity: Not Currently  Lifestyle   Physical activity    Days per week: Not on file    Minutes per session: Not on file   Stress: Not on file  Relationships   Social connections    Talks on phone: Not on file    Gets together: Not on file    Attends religious service: Not on file    Active member of club or organization: Not on file    Attends meetings of clubs or organizations: Not on file    Relationship status: Not on file   Intimate partner violence    Fear of current or ex partner: Not on file    Emotionally abused: Not on file    Physically abused: Not on file    Forced sexual activity: Not on file  Other Topics Concern   Not on  file  Social History Narrative   Not on file    FH:  Family History  Problem Relation Age of Onset   Hypertension Mother    Diabetes Mother     Past Medical History:  Diagnosis Date   A-fib [facility])    Anemia 05/29/2019   CHF (congestive heart failure) ([place])    Chronic respiratory failure ([place])    Diabetes mellitus without complication ([place])    Elevated troponin 02/06/2019   Hypertension    Obesity    Pulmonary embolism (H[facility])    Rectal bleeding 05/29/2019   Sleep apnea     Current Outpatient Medications  Medication Sig Dispense Refill   albuterol (VENTOLIN HFA) 108 (90 Base) MCG/ACT inhaler Inhale 2 puffs into the lungs every 6 (six) hours as needed for wheezing or shortness of breath. 18 g 0   amLODipine (NORVASC) 10 MG tablet Take 1 tablet (10 mg total) by mouth daily. 90 tablet 1   atorvastatin (LIPITOR) 20 MG tablet Take 1 tablet (20 mg total) by mouth at bedtime. 90 tablet 2   Blood Glucose Monitoring Suppl (TRUE METRIX METER) w/Device KIT Use as instructed. Check blood glucose level by fingerstick twice per day. 1 kit 0   carboxymethylcellulose (REFRESH PLUS) 0.5 % SOLN Place 1 drop into both eyes daily.      docusate sodium (COLACE) 100 MG capsule Take 1 capsule (100 mg total) by mouth 2 (two) times daily. 180 capsule 1   ferrous sulfate 325 (65 FE) MG tablet Take 1 tablet (325 mg total) by mouth daily. Patient will pick up scripts today. 90 tablet 0   glucose blood (TRUE METRIX BLOOD GLUCOSE TEST) test strip Use as instructed. Check blood glucose level by fingerstick twice per day. 100 each 12   labetalol (NORMODYNE) 200 MG tablet Take 1 tablet (200 mg total) by mouth 2 (two) times daily. 120 tablet 0   levalbuterol (XOPENEX HFA) 45 MCG/ACT inhaler Inhale 2 puffs into the lungs every 4 (four) hours as needed for wheezing.      lisinopril (ZESTRIL) 5 MG tablet Take 1 tablet (5 mg total) by mouth daily. 30 tablet 0   metFORMIN (GLUCOPHAGE) 500 MG  tablet Take 1 tablet (500 mg total) by mouth 2 (two) times daily with a meal. Patient will pick up scripts today. 180 tablet 1   Misc. Devices MISC Please provide Benjamin [name] with insurance approved portable O2 concentrator ICD 10 J96.11 Z99.81 1 each 0   Misc. Devices MISC Requires O2 @ 3L/min continuously via nasal canula and home fill system,. 1 each 0   omeprazole (PRILOSEC) 20 MG capsule Take  1 capsule (20 mg total) by mouth daily. 30 capsule 3   potassium chloride SA (K-DUR) 20 MEQ tablet Take 2 tablets (40 mEq total) by mouth 2 (two) times daily. 360 tablet 3   rivaroxaban (XARELTO) 20 MG TABS tablet Take 1 tablet (20 mg total) by mouth daily with supper. 60 tablet 0   torsemide (DEMADEX) 100 MG tablet Take 1 tablet (100 mg total) by mouth 2 (two) times daily. 180 tablet 3   TRUEplus Lancets 28G MISC Use as instructed. Check blood glucose level by fingerstick twice per day. 200 each 3   No current facility-administered medications for this encounter.     Vitals:   07/30/19 1137  Weight: (!) 194.3 kg (428 lb 6.4 oz)   Wt Readings from Last 3 Encounters:  07/30/19 (!) 194.3 kg (428 lb 6.4 oz)  07/23/19 (!) 186.4 kg (411 lb)  07/18/19 (!) 189.6 kg (418 lb)    PHYSICAL EXAM: PHYSICAL EXAM: General:  Supper morbidly obese AAM, mildly labored breathing w/ [place]  HEENT: normal Neck: thick neck, elevated JVD. Carotids 2+ bilat; no bruits. No lymphadenopathy or thyromegaly appreciated. Cor: PMI nondisplaced. Regular rate & rhythm. No rubs, gallops or murmurs. Lungs: clear Abdomen: obese, nontender, distended. No hepatosplenomegaly. No bruits or masses. Good bowel sounds. Extremities: no cyanosis, clubbing, rash, 2+ bilateral LE edema Neuro: alert & oriented x 3, cranial nerves grossly intact. moves all 4 extremities w/o difficulty. Affect pleasant.   ASSESSMENT & PLAN:  Pt seen along side Dr. Aundra Dubin. Will plan to admit to [facility] for a/c Diastolic CHF.   1. Acute/Chronic  diastolic CHF: Echo in 6/78 showed EF 60-65% but RV was poorly visualized but suspect significant RV dysfunction in setting of OSA, unfortunately unable to see the RV well on last echo. [place] on 7/29 showed elevated left and right heart filling pressures but not marked elevation.  Multiple hospitalizations over the last several months for a/c dCHF, most recent 10/27-29.  -Continues to door poorly. Volume status elevated in the setting of high sodium diet and increased fluid intake. Wt up to 428 lb (dry wt ~407). Very symptomatic w/ NYHA IIIb-IV symptoms.  - Plan to readmit for IV diuretics, 80 mg bid.  -Give 1 x dose of metolazone 2.5 mg -Will place PICC line for CVP monitoring to help guide diuresis given super morbid obesity  - ? If candidate for cardiomems. Obesity may be prohibitive  -strict I/Os and daily wts.  -Daily BMPs -low salt diet   2. CKD Stage II.   -check CMP on admit.   3. Atrial fibrillation: Paroxysmal.   - RRR on exam. Check 12 lead EKG on admit.  - Continue Xarelto. Denies abnormal bleeding   4. OSA: Will order CPAP nightly.   5. Obesity: Body mass index is 59.75 kg/m.     6. H/o PE: 5/20.  Continue Xarelto.    7. HTN: Elevated. In the setting of volume overload -treat w/ IV diuretics -continue home regimen, amlodipine 10, labetalol 200 mg, lisinopril 5 mg  8. Hematochezia: Had scope in June with internal hemorrhoids. Continue stool softener. Continue anusol HC as needed per GI recommendations after scope.  - No further bleeding. On Xarelto -check CBC    Aalyssa Elderkin PA-C  11:41 AM

## 2019-07-30 NOTE — H&P (Addendum)
Advanced Heart Failure Team History and Physical Note   PCP:  [name], NP  PCP-Cardiology: Dorris Carnes, MD     Reason for Admission: a/c dCHF   HPI:    HPI: [name] a 53 yo with hx of diastolic CHF, HTN, DM, Atrial fib, PE in April 2020 (on Xarelto), OSA, GERD,chronic elevation of troponin.  Admitted 01/21/2019 with increased dyspnea and had PE. He was placed on back on coumadin.  Patient re-admitted 5/20 for syncope/cough/dyspnea, COVID+, CT positive for bilateral GGOs, admitted to [facility] and discharged after 6 days, on O2. Rec'd Cards f/u.  Patient re-admitted 02/20/19 for bright red blood per rectum, eval'ed by GI, had EGD/colon, dx'd with hemorrhoidal bleeding, discharged after 9 days on O2.He was again Covid + .  Readmitted 03/11/19 with increased shortness of breath. He did not have home oxygen. He was discharged to home the next day.  He presented to [facility] ED7/23/20with lower extremity edema. COVID negative. Diuresed with lasix drip and transitioned to torsemide 40 mg twice daily. Had RHC as noted below with mild volume overload and preserved cardiac output. Discharge weight was 407.9 pounds.   He was readmitted with symptomatic anemia 9/8 with hgb 7.1. Received 1UPRBCs. GI consulted. He started on anusol and continued on stool softner.   He had f/u visit 10/20 and was complaining of increased leg edema and weight gain.  SOB with exertion. Denied PND. + Orthopnea.  No bleeding issues. Was eating high sodium food --> Bo jangles chicken. No fever or chills. Taking all medications. Followed closely by HF Paramedicine. He was given a dose of IV Lasix in clinic, 80 mg w/ > 1300 urine output after IV lasix.His home torsemide dose was increased to 100 bid and he was instructed to f/u in 2 weeks.    Unfortunately, he was readmitted to the hospital on 10/27 when he presented to the ED w/ worsening symptoms and was admitted by IM for a/c dCHF and treated w/ IV Lasix.  Had 2 day hospital stay and was discharged on 10/29. Was placed back on home diuretic regimen, torsemide 100 mg bid. Lisinopril 5 mg was also added to regimen. AHF team was not consulted.   He presents back to clinic today for f/u. Feels poorly. SOB at rest and worse w/ exertion. Also w/ orthopnea/PND. Notes significant wt gain, up to 428 lb (dry wt ~407 lb). With increased LEE and abdominal distention. Looks uncomfortable. Discussed w/ Dr. Aundra [name], who has also seen pt. Will admit to [facility] for IV diuretics.     Echo5/2020:EF 60-65%, grade I DD  RHC 04/17/2019  RA mean 12 RV 37/18 PA 40/25, mean 31 PCWP mean 23 Oxygen saturations: PA 64% AO 100% Cardiac Output (Fick) 7.07  Cardiac Index (Fick) 2.47 PVR 1.1 WU    Review of Systems: [y] = yes, _0  = no   General: Weight gain [Y ]; Weight loss _1 ; Anorexia _2 ; Fatigue _3 ; Fever _4 ; Chills _5 ; Weakness _6   Cardiac: Chest pain/pressure _7 ; Resting SOB [ Y]; Exertional SOB [ Y]; Orthopnea [Y ]; Pedal Edema [ Y]; Palpitations _8 ; Syncope _9 ; Presyncope _10 ; Paroxysmal nocturnal dyspnea_11   Pulmonary: Cough _12 ; Wheezing_13 ; Hemoptysis_14 ; Sputum _15 ; Snoring _16   GI: Vomiting_17 ; Dysphagia_18 ; Melena_19 ; Hematochezia _20 ; Heartburn_21 ; Abdominal pain _22 ; Constipation _23 ; Diarrhea _24 ; BRBPR _25   GU: Hematuria_26 ; Dysuria _27 ; Nocturia_28   Vascular: Pain in legs with walking _0 ; Pain in feet with lying flat _1 ; Non-healing sores _2 ; Stroke _3 ; TIA _4 ; Slurred speech _5 ;  Neuro: Headaches_6 ; Vertigo_7 ; Seizures_8 ; Paresthesias_9 ;Blurred vision _10 ; Diplopia _11 ; Vision changes _12   Ortho/Skin: Arthritis _13 ; Joint pain _14 ; Muscle pain _15 ; Joint swelling _16 ; Back Pain _17 ; Rash _18   Psych: Depression_19 ; Anxiety_20   Heme: Bleeding problems _21 ; Clotting disorders _22 ; Anemia _23   Endocrine: Diabetes _24 ; Thyroid dysfunction_25    Home Medications Prior to Admission medications   Medication Sig Start Date End Date  Taking? Authorizing Provider  albuterol (VENTOLIN HFA) 108 (90 Base) MCG/ACT inhaler Inhale 2 puffs into the lungs every 6 (six) hours as needed for wheezing or shortness of breath. 06/24/19   [name], NP  amLODipine (NORVASC) 10 MG tablet Take 1 tablet (10 mg total) by mouth daily. 06/04/19 09/02/19  [name], NP  atorvastatin (LIPITOR) 20 MG tablet Take 1 tablet (20 mg total) by mouth at bedtime. 06/04/19 09/02/19  [name], NP  Blood Glucose Monitoring Suppl (TRUE METRIX METER) w/Device KIT Use as instructed. Check blood glucose level by fingerstick twice per day. 03/18/19   [name], NP  carboxymethylcellulose (REFRESH PLUS) 0.5 % SOLN Place 1 drop into both eyes daily.     [provider]  docusate sodium (COLACE) 100 MG capsule Take 1 capsule (100 mg total) by mouth 2 (two) times daily. 06/28/19 09/26/19  [name], NP  ferrous sulfate 325 (65 FE) MG tablet Take 1 tablet (325 mg total) by mouth daily. Patient will pick up scripts today. 06/24/19 09/22/19  [name], NP  glucose blood (TRUE METRIX BLOOD GLUCOSE TEST) test strip Use as instructed. Check blood glucose level by fingerstick twice per day. 03/18/19   [name], NP  labetalol (NORMODYNE) 200 MG tablet Take 1 tablet (200 mg total) by mouth 2 (two) times daily. 04/19/19   Kayleen Memos, DO  levalbuterol [facility] HFA) 45 MCG/ACT inhaler Inhale 2 puffs into the lungs every 4 (four) hours as needed for wheezing.     [provider]  lisinopril (ZESTRIL) 5 MG tablet Take 1 tablet (5 mg total) by mouth daily. 07/18/19   Danford, Suann Larry, MD  metFORMIN (GLUCOPHAGE) 500 MG tablet Take 1 tablet (500 mg total) by mouth 2 (two) times daily with a meal. Patient will pick up scripts today. 06/28/19 09/26/19  [name], NP  Misc. Devices MISC Please provide Mr. Cerda with insurance approved portable O2 concentrator ICD 10 J96.11 Z99.81 06/30/19   [name], NP  Misc.  Devices MISC Requires O2 @ 3L/min continuously via nasal canula and home fill system,. 07/03/19   [name], NP  omeprazole (PRILOSEC) 20 MG capsule Take 1 capsule (20 mg total) by mouth daily. 05/19/19   [name], NP  potassium chloride SA (K-DUR) 20 MEQ tablet Take 2 tablets (40 mEq total) by mouth 2 (two) times daily. 06/05/19 07/30/19  Clegg, Amy D, NP  rivaroxaban (XARELTO) 20 MG TABS tablet Take 1 tablet (20 mg total) by mouth daily with supper. 04/19/19   Kayleen Memos, DO  torsemide (DEMADEX) 100 MG tablet Take 1 tablet (100 mg total) by mouth 2 (two) times daily. 07/09/19   Clegg, Amy D, NP  TRUEplus Lancets 28G MISC Use as instructed.  Check blood glucose level by fingerstick twice per day. 03/18/19   [name], NP    Past Medical History: Past Medical History:  Diagnosis Date  . A-fib ([place])   . Anemia 05/29/2019  . CHF (congestive heart failure) ([place])   . Chronic respiratory failure ([place])   . Diabetes mellitus without complication ([place])   . Elevated troponin 02/06/2019  . Hypertension   . Obesity   . Pulmonary embolism ([place])   . Rectal bleeding 05/29/2019  . Sleep apnea     Past Surgical History: Past Surgical History:  Procedure Laterality Date  . COLONOSCOPY WITH PROPOFOL Left 02/25/2019   Procedure: COLONOSCOPY WITH PROPOFOL;  Surgeon: Arta Silence, MD;  Location: [place];  Service: Endoscopy;  Laterality: Left;  . ESOPHAGOGASTRODUODENOSCOPY (EGD) WITH PROPOFOL Left 02/25/2019   Procedure: ESOPHAGOGASTRODUODENOSCOPY (EGD) WITH PROPOFOL;  Surgeon: Arta Silence, MD;  [facility] ENDOSCOPY;  Service: Endoscopy;  Laterality: Left;  . ESOPHAGOGASTRODUODENOSCOPY (EGD) WITH PROPOFOL N/A 06/12/2019   Procedure: ESOPHAGOGASTRODUODENOSCOPY (EGD) WITH PROPOFOL;  Surgeon: Clarene Essex, MD;  Location: [place];  Service: Gastroenterology;  Laterality: N/A;  . FLEXIBLE SIGMOIDOSCOPY N/A 06/12/2019   Procedure: FLEXIBLE SIGMOIDOSCOPY;  Surgeon: Clarene Essex,  MD;  Location: [place];  Service: Gastroenterology;  Laterality: N/A;  . NO PAST SURGERIES    . RIGHT HEART CATH N/A 04/17/2019   Procedure: RIGHT HEART CATH;  Surgeon: Larey Dresser, MD;  Location: [place] CV LAB;  Service: Cardiovascular;  Laterality: N/A;    Family History:  Family History  Problem Relation Age of Onset  . Hypertension Mother   . Diabetes Mother     Social History: Social History   Socioeconomic History  . Marital status: Single    Spouse name: Not on file  . Number of children: Not on file  . Years of education: Not on file  . Highest education level: Not on file  Occupational History  . Not on file  Social Needs  . Financial resource strain: Not on file  . Food insecurity    Worry: Not on file    Inability: Not on file  . Transportation needs    Medical: Not on file    Non-medical: Not on file  Tobacco Use  . Smoking status: Former Research scientist (life sciences)  . Smokeless tobacco: Never Used  Substance and Sexual Activity  . Alcohol use: Not Currently  . Drug use: Not Currently  . Sexual activity: Not Currently  Lifestyle  . Physical activity    Days per week: Not on file    Minutes per session: Not on file  . Stress: Not on file  Relationships  . Social Herbalist on phone: Not on file    Gets together: Not on file    Attends religious service: Not on file    Active member of club or organization: Not on file    Attends meetings of clubs or organizations: Not on file    Relationship status: Not on file  Other Topics Concern  . Not on file  Social History Narrative  . Not on file    Allergies:  No Known Allergies  Objective:    Vital Signs:       There were no vitals filed for this visit.   Physical Exam     Vital Signs:   PHYSICAL EXAM: General:  Super morbidly obese AAM mildly labored breathing on [place] HEENT: normal Neck: thick neck, hard to assess JVD. Carotids 2+ bilat; no bruits.  No lymphadenopathy or thyromegaly  appreciated. Cor: PMI nondisplaced. Regular rate & rhythm. No rubs, gallops or murmurs. Lungs: clear Abdomen: obese and distended. No hepatosplenomegaly. No bruits or masses. Good bowel sounds. Extremities: no cyanosis, clubbing, rash, 2+ bilateral LEE Neuro: alert & oriented x 3, cranial nerves grossly intact. moves all 4 extremities w/o difficulty. Affect pleasant.   Telemetry   N/A  EKG   NSR 87 bpm   Labs     Basic Metabolic Panel: No results for input(s): NA, K, CL, CO2, GLUCOSE, BUN, CREATININE, CALCIUM, MG, PHOS in the last 168 hours.  Liver Function Tests: No results for input(s): AST, ALT, ALKPHOS, BILITOT, PROT, ALBUMIN in the last 168 hours. No results for input(s): LIPASE, AMYLASE in the last 168 hours. No results for input(s): AMMONIA in the last 168 hours.  CBC: No results for input(s): WBC, NEUTROABS, HGB, HCT, MCV, PLT in the last 168 hours.  Cardiac Enzymes: No results for input(s): CKTOTAL, CKMB, CKMBINDEX, TROPONINI in the last 168 hours.  BNP: BNP (last 3 results) Recent Labs    05/12/19 0140 05/28/19 1108 07/16/19 1730  BNP 12.8 25.1 29.4    ProBNP (last 3 results) No results for input(s): PROBNP in the last 8760 hours.   CBG: No results for input(s): GLUCAP in the last 168 hours.  Coagulation Studies: No results for input(s): LABPROT, INR in the last 72 hours.  Imaging: No results found.   Patient Profile   [name] a 53 yo morbidly obese AAM with hx of chronic diastolic CHF, HTN, DM, Atrial fib, PE in April 2020 (on Xarelto), OSA, GERD, chronic elevation of troponin and multiple admissions for a/c dCHF over the last 6 months, being readmitted from clinic for recurrent a/c CHF w/ volume overload.   Assessment/Plan   1. Acute/Chronic diastolic CHF: Echo in 6/31 showed EF 60-65% but RV was poorly visualized but suspect significant RV dysfunction in setting of OSA, unfortunately unable to see the RV well on last echo. RHCon  7/29showed elevated left and right heart filling pressures but not marked elevation. Multiple hospitalizations over the last several months for a/c dCHF, most recent 10/27-29.  -Continues to door poorly. Volume status elevated in the setting of high sodium diet and increased fluid intake. Wt up to 428 lb (dry wt ~407). Very symptomatic w/ NYHA IIIb-IV symptoms.  - Plan to readmit for IV diuretics, 80 mg bid.  -Give 1 x dose of metolazone 2.5 mg -Will place PICC line for CVP monitoring to help guide diuresis given super morbid obesity  - ? If candidate for cardiomems. Obesity may be prohibitive  - place unna boots  -strict I/Os and daily wts.  -Daily BMPs -low salt diet   2. CKD Stage II.   -check CMP on admit.   3. Atrial fibrillation: Paroxysmal.  - RRR on exam. Check 12 lead EKG on admit.  -Continue Xarelto. Denies abnormal bleeding   4. OSA: Will order CPAP nightly.   5. Obesity: Body mass index is 59.75 kg/m.     6. H/o PE: 5/20.Continue Xarelto.  7. HTN: Elevated. In the setting of volume overload -treat w/ IV diuretics -continue home regimen, amlodipine 10, labetalol 200 mg, lisinopril 5 mg  8. Hematochezia: Had scope in June with internal hemorrhoids. Continue stool softener. Continue anusol HC as needed per GI recommendations after scope.  - denies further bleeding. On Xarelto -check CBC  Lyda Jester, PA-C 07/30/2019, 12:33 PM  Advanced Heart Failure Team Pager [phone] (M-F; 7a -  4p)  Please contact [facility] for night-coverage after hours (4p -7a ) and weekends on [email or web address]  Patient seen with PA, agree with the above note.   He has gained 20 lbs recently and is short of breath at rest.  He has been taking torsemide 100 mg bid at home without good response.  He feels swollen. ECG shows NSR, nonspecific T wave abnormalities.   General: NAD Neck: Thick, JVP difficult but appears significantly elevated, no thyromegaly or thyroid nodule.   Lungs: Clear to auscultation bilaterally with normal respiratory effort. CV: Nonpalpable PMI.  Heart regular S1/S2, no S3/S4, no murmur.  2+ chronic edema to thighs.  No carotid bruit.  Normal pedal pulses.  Abdomen: Soft, nontender, no hepatosplenomegaly, mild distention.  Skin: Intact without lesions or rashes.  Neurologic: Alert and oriented x 3.  Psych: Normal affect. Extremities: No clubbing or cyanosis.  HEENT: Normal.   1. Acute on chronic diastolic CHF: Echo in 00/16 with EF 65-70%, RV interpreted as normal but suspect significant RV dysfunction.  He is markedly volume overloaded on exam and swollen/uncomfortable.  He is failing outpatient diuretic regimen.  - I will admit him today.  - Place PICC line to more easily monitor volume.  - Lasix 80 mg IV bid with 1 dose of metolazone 2.5 mg today and follow response.  - Place Unna boots.  2. Atrial fibrillation: Paroxysmal.  He is in NSR today.  - Continue Xarelto.  3. H/o PE: 5/20, continue Xarelto.  4. OHS/OSA: Continue CPAP at night and oxygen during the day.   Loralie Champagne 07/30/2019 1:23 PM

## 2019-07-30 NOTE — Progress Notes (Signed)
LATE ENTRY FROM 12:30 PM:  PIV inserted into pt's R forarm, tolerated well.

## 2019-07-30 NOTE — Progress Notes (Signed)
Per day shift nurse, IV team member that places PICC lines had to got to another campus so it would be a while before PICC line is placed.

## 2019-07-31 ENCOUNTER — Encounter (HOSPITAL_COMMUNITY): Payer: Self-pay | Admitting: *Deleted

## 2019-07-31 LAB — BASIC METABOLIC PANEL
Anion gap: 11 (ref 5–15)
BUN: 10 mg/dL (ref 6–20)
CO2: 32 mmol/L (ref 22–32)
Calcium: 8.8 mg/dL — ABNORMAL LOW (ref 8.9–10.3)
Chloride: 95 mmol/L — ABNORMAL LOW (ref 98–111)
Creatinine, Ser: 1.13 mg/dL (ref 0.61–1.24)
GFR calc Af Amer: >60 mL/min (ref >60)
GFR calc non Af Amer: >60 mL/min (ref >60)
Glucose, Bld: 132 mg/dL — ABNORMAL HIGH (ref 70–99)
Potassium: 3.4 mmol/L — ABNORMAL LOW (ref 3.5–5.1)
Sodium: 138 mmol/L (ref 135–145)

## 2019-07-31 MED ORDER — METOLAZONE 2.5 MG PO TABS
2.5000 mg | ORAL_TABLET | Freq: Once | ORAL | Status: AC
  Administered 2019-07-31: 2.5 mg via ORAL
  Filled 2019-07-31: qty 1

## 2019-07-31 MED ORDER — POTASSIUM CHLORIDE CRYS ER 20 MEQ PO TBCR
40.0000 meq | EXTENDED_RELEASE_TABLET | Freq: Three times a day (TID) | ORAL | Status: DC
  Administered 2019-07-31 – 2019-08-02 (×6): 40 meq via ORAL
  Filled 2019-07-31 (×7): qty 2

## 2019-07-31 MED ORDER — SODIUM CHLORIDE 0.9% FLUSH
10.0000 mL | Freq: Two times a day (BID) | INTRAVENOUS | Status: DC
  Administered 2019-07-31: 10 mL
  Administered 2019-08-01: 20 mL
  Administered 2019-08-02 (×2): 10 mL

## 2019-07-31 MED ORDER — CHLORHEXIDINE GLUCONATE CLOTH 2 % EX PADS
6.0000 | MEDICATED_PAD | Freq: Every day | CUTANEOUS | Status: DC
  Administered 2019-08-01: 6 via TOPICAL

## 2019-07-31 MED ORDER — SPIRONOLACTONE 12.5 MG HALF TABLET
12.5000 mg | ORAL_TABLET | Freq: Every day | ORAL | Status: DC
  Administered 2019-08-01 – 2019-08-02 (×2): 12.5 mg via ORAL
  Filled 2019-07-31 (×2): qty 1

## 2019-07-31 MED ORDER — SODIUM CHLORIDE 0.9% FLUSH: 10.0000 mL | INTRAVENOUS | Status: DC | PRN

## 2019-07-31 NOTE — Progress Notes (Signed)
Received consult from RN requesting approximate time of PICC line placement. Notified no PICC nurse present on night shift and unable to determine exact time.

## 2019-07-31 NOTE — Progress Notes (Addendum)
CEP/Health and Wellness Coach met with patient during clinic visit yesterday along with paramedic and Education officer, museum. Patient presented with weight up 20 lbs and with plan to be directly admitted to the hospital for diuresis by Dr. Aundra Dubin. Patient stated he has not been diligent with his diet and has actually strayed from being mindful in his meal decision making, by utilizing [employer] and ordering from fast food restaurants (bojangles, [employer], etc). He stated that he understands that food choice is not healthy for him, but stated he misunderstood that even "a little bit" could contribute to fluid retention. Patient stated several times that he struggles with what types of foods to eat that will satisfy him. He is currently limited in his living facility with options, and this is why he began using [facility]. CEP/HWC explained that there are healthy options available to him through that app if he insisted on continuing that method of meal preparation, although food is provided in his current living facility, with considerations to his specific diet.   Upon exploration of patient's compliance to plan of action, he has not been exercising since our last meeting due to discomfort of edema and dyspnea related to his fluid retention. He had a stressful day or two after our last meeting and "stress ate" vs. Exercising as he has noted to work to relive his stress before. He made several comments about not knowing what he can and cannot eat and that he is tired of eating the same things all of the time. He requested handouts and more information regarding some diet guidelines and restrictions with options for him.    I spoke with patient directly in person in clinic and on the inpatient unit yesterday, as well as with a follow-up call today. Mr. Waymire is open and acceptable to interventions to improve his health and wellness. He is currently admitted and CEP/HWC will follow once patient is more comfortable, stable  and discharged. CEP/HWC will attempt to outsource appropriate areas (specifically nutrition counseling) to help provide Mr. Hallums with education.     Landis Martins, MS, ACSM-RCEP Clinical Exercise Physiologist

## 2019-07-31 NOTE — Progress Notes (Addendum)
Advanced Heart Failure Rounding Note  PCP-Cardiologist: Dietrich Pates, MD   Subjective:    Admitted with marked volume overload. Started on IV lasix + metolazone. Brisk diuresis noted. Weight down 7 pounds.   Remains SOB with exertion.   Objective:   Weight Range: (!) 190.7 kg Body mass index is 58.65 kg/m.   Vital Signs:   Temp:  [97.5 F (36.4 C)-98.6 F (37 C)] 98.3 F (36.8 C) (11/11 0728) Pulse Rate:  [84-90] 87 (11/11 0728) Resp:  [18-20] 20 (11/11 0728) BP: (119-183)/(81-110) 183/100 (11/11 0728) SpO2:  [95 %-98 %] 95 % (11/11 0728) Weight:  [190.7 kg-193.8 kg] 190.7 kg (11/11 0358) Last BM Date: 07/28/19  Weight change: Filed Weights   07/30/19 1453 07/31/19 0358  Weight: (!) 193.8 kg (!) 190.7 kg    Intake/Output:   Intake/Output Summary (Last 24 hours) at 07/31/2019 1139 Last data filed at 07/31/2019 1024 Gross per 24 hour  Intake 924 ml  Output 7850 ml  Net -6926 ml      Physical Exam    General:   No resp difficulty. Sitting in the chair.  HEENT: Normal Neck: Supple. JVP to jaw . Carotids 2+ bilat; no bruits. No lymphadenopathy or thyromegaly appreciated. Cor: PMI nondisplaced. Regular rate & rhythm. No rubs, gallops or murmurs. Lungs: Clear on 3 liters [place].  Abdomen: Soft, nontender, nondistended. No hepatosplenomegaly. No bruits or masses. Good bowel sounds. Extremities: No cyanosis, clubbing, rash, R and LLE 1+ edema. RUE PICC  Neuro: Alert & orientedx3, cranial nerves grossly intact. moves all 4 extremities w/o difficulty. Affect pleasant   Telemetry  NSR 80-90s Personally reviewed.    EKG    n/a  Labs    CBC Recent Labs    07/30/19 1225  WBC 8.3  HGB 9.3*  HCT 33.4*  MCV 76.3*  PLT 433*   Basic Metabolic Panel Recent Labs    33/82/50 1225 07/31/19 0330  NA 138 138  K 3.5 3.4*  CL 100 95*  CO2 29 32  GLUCOSE 105* 132*  BUN 8 10  CREATININE 1.00 1.13  CALCIUM 8.8* 8.8*   Liver Function Tests Recent Labs   07/30/19 1225  AST 18  ALT 14  ALKPHOS 47  BILITOT 0.4  PROT 7.1  ALBUMIN 3.5   No results for input(s): LIPASE, AMYLASE in the last 72 hours. Cardiac Enzymes No results for input(s): CKTOTAL, CKMB, CKMBINDEX, TROPONINI in the last 72 hours.  BNP: BNP (last 3 results) Recent Labs    05/28/19 1108 07/16/19 1730 07/30/19 1225  BNP 25.1 29.4 39.3    ProBNP (last 3 results) No results for input(s): PROBNP in the last 8760 hours.   D-Dimer No results for input(s): DDIMER in the last 72 hours. Hemoglobin A1C No results for input(s): HGBA1C in the last 72 hours. Fasting Lipid Panel No results for input(s): CHOL, HDL, LDLCALC, TRIG, CHOLHDL, LDLDIRECT in the last 72 hours. Thyroid Function Tests Recent Labs    07/30/19 1225  TSH 2.293    Other results:   Imaging    [place] Ekg Site Rite  Result Date: 07/30/2019 If Site Rite image not attached, placement could not be confirmed due to current cardiac rhythm.     Medications:     Scheduled Medications: . amLODipine  10 mg Oral Daily  . atorvastatin  20 mg Oral QHS  . Chlorhexidine Gluconate Cloth  6 each Topical Daily  . docusate sodium  100 mg Oral BID  . ferrous sulfate  325 mg Oral Daily  . furosemide  80 mg Intravenous Q12H  . labetalol  200 mg Oral BID  . lisinopril  5 mg Oral Daily  . pantoprazole  40 mg Oral Daily  . potassium chloride SA  40 mEq Oral BID  . rivaroxaban  20 mg Oral Q supper  . sodium chloride flush  10-40 mL Intracatheter Q12H  . sodium chloride flush  3 mL Intravenous Q12H     Infusions: . sodium chloride       PRN Medications:  sodium chloride, acetaminophen, albuterol, hydrALAZINE, sodium chloride flush, sodium chloride flush     Assessment/Plan   1. Acute on chronic diastolic CHF: Echo in 75/10 with EF 65-70%, RV interpreted as normal but suspect significant RV dysfunction.  He failed outpatient diuretic regimen due to high sodium diet.  - Volume status improving.  Continue lasix 80 mg IV bid with 1 dose of metolazone. Add 12.5 mg spironolactone daily. Increase potassium to 40 meq three times a day.  -  Continue current regimen. Set up CVP. Renal function stable.   - Place Unna boots.  2. Atrial fibrillation: Paroxysmal.   - Maintaining NSR.   - Continue Xarelto.  3. H/o PE: 5/20, continue Xarelto.  4. OHS/OSA: Continue CPAP at night and oxygen during the day.   Set up PICC. Consult cardiac rehab.   Length of Stay: 1  Amy Clegg, NP  07/31/2019, 11:39 AM  Advanced Heart Failure Team Pager [phone] (M-F; 7a - 4p)  Please contact [facility] for night-coverage after hours (4p -7a ) and weekends on [email or web address]  Patient seen with NP, agree with the above note.   Great diuresis yesterday, stable creatinine.   On exam, he remains volume overloaded.   Set up CVP and follow to direct diuresis as exam is difficult.  Continue Lasix 80 mg IV bid and will give a metolazone dose.  Add spironolactone.  Increase KCl.   Loralie Champagne 07/31/2019 1:33 PM

## 2019-07-31 NOTE — Progress Notes (Signed)
Pt doesn't like wearing the hospital mask for CPAP.  Pt is trying to get family member to bring his home CPAP and tubing.  PT instructed to have RN call RT if he got his home equipment.  Pt is in no distress at this time.

## 2019-07-31 NOTE — Progress Notes (Signed)
Peripherally Inserted Central Catheter/Midline Placement  The IV Nurse has discussed with the patient and/or persons authorized to consent for the patient, the purpose of this procedure and the potential benefits and risks involved with this procedure.  The benefits include less needle sticks, lab draws from the catheter, and the patient may be discharged home with the catheter. Risks include, but not limited to, infection, bleeding, blood clot (thrombus formation), and puncture of an artery; nerve damage and irregular heartbeat and possibility to perform a PICC exchange if needed/ordered by physician.  Alternatives to this procedure were also discussed.  B[facility] Power PICC patient education guide, fact sheet on infection prevention and patient information card has been provided to patient /or left at bedside.    PICC/Midline Placement Documentation  PICC Double Lumen 51/76/16 PICC Right Basilic 45 cm 0 cm (Active)  Indication for Insertion or Continuance of Line Prolonged intravenous therapies 07/31/19 1000  Exposed Catheter (cm) 0 cm 07/31/19 1000  Site Assessment Clean;Dry;Intact 07/31/19 1000  Lumen #1 Status Flushed;Saline locked;Blood return noted 07/31/19 1000  Lumen #2 Status Flushed;Saline locked;Blood return noted 07/31/19 1000  Dressing Type Transparent;Securing device 07/31/19 1000  Dressing Status Clean;Dry;Intact;Antimicrobial disc in place 07/31/19 [place] checked and tightened 07/31/19 1000  Dressing Intervention New dressing;Other (Comment) 07/31/19 1000  Dressing Change Due 08/07/19 07/31/19 1000   Patient gave written consent   Benjamin [name] 07/31/2019, 10:29 AM

## 2019-07-31 NOTE — Progress Notes (Signed)
Placed additional IV team consult requesting call for update on patient PICC placement.

## 2019-07-31 NOTE — Plan of Care (Signed)
Patient on CVP monitoring with diurese

## 2019-07-31 NOTE — Progress Notes (Signed)
Per IV team nurse that returned the call, there is not anyone here tonight to place the PICC and she is not sure what time the line will be able to placed tomorrow.

## 2019-07-31 NOTE — Plan of Care (Signed)
CVP monitoring to start

## 2019-08-01 ENCOUNTER — Other Ambulatory Visit (HOSPITAL_COMMUNITY): Payer: Self-pay

## 2019-08-01 LAB — BASIC METABOLIC PANEL
Anion gap: 10 (ref 5–15)
BUN: 15 mg/dL (ref 6–20)
CO2: 33 mmol/L — ABNORMAL HIGH (ref 22–32)
Calcium: 9.2 mg/dL (ref 8.9–10.3)
Chloride: 95 mmol/L — ABNORMAL LOW (ref 98–111)
Creatinine, Ser: 1.26 mg/dL — ABNORMAL HIGH (ref 0.61–1.24)
GFR calc Af Amer: >60 mL/min (ref >60)
GFR calc non Af Amer: >60 mL/min (ref >60)
Glucose, Bld: 122 mg/dL — ABNORMAL HIGH (ref 70–99)
Potassium: 3.4 mmol/L — ABNORMAL LOW (ref 3.5–5.1)
Sodium: 138 mmol/L (ref 135–145)

## 2019-08-01 LAB — COOXEMETRY PANEL
Carboxyhemoglobin: 1.1 % (ref 0.5–1.5)
Methemoglobin: 0.6 % (ref 0.0–1.5)
O2 Saturation: 43.2 %
Total hemoglobin: 10.3 g/dL — ABNORMAL LOW (ref 12.0–16.0)

## 2019-08-01 LAB — MAGNESIUM: Magnesium: 1.7 mg/dL (ref 1.7–2.4)

## 2019-08-01 MED ORDER — MAGNESIUM SULFATE 2 GM/50ML IV SOLN
2.0000 g | Freq: Once | INTRAVENOUS | Status: AC
  Administered 2019-08-01: 2 g via INTRAVENOUS
  Filled 2019-08-01: qty 50

## 2019-08-01 MED ORDER — POTASSIUM CHLORIDE CRYS ER 20 MEQ PO TBCR
40.0000 meq | EXTENDED_RELEASE_TABLET | Freq: Once | ORAL | Status: AC
  Administered 2019-08-01: 40 meq via ORAL
  Filled 2019-08-01: qty 2

## 2019-08-01 MED ORDER — SODIUM CHLORIDE 0.9 % IV SOLN
INTRAVENOUS | Status: DC | PRN
  Administered 2019-08-01: 250 mL via INTRAVENOUS

## 2019-08-01 MED ORDER — POLYETHYLENE GLYCOL 3350 17 G PO PACK
17.0000 g | PACK | Freq: Every day | ORAL | Status: DC | PRN
  Administered 2019-08-01: 17 g via ORAL
  Filled 2019-08-01: qty 1

## 2019-08-01 MED ORDER — METOLAZONE 2.5 MG PO TABS
2.5000 mg | ORAL_TABLET | Freq: Once | ORAL | Status: AC
  Administered 2019-08-01: 2.5 mg via ORAL
  Filled 2019-08-01 (×2): qty 1

## 2019-08-01 NOTE — Progress Notes (Signed)
Patient ID: Benjamin [name], male   DOB: [birth year], 53 y.o.   MRN: [id number]     Advanced Heart Failure Rounding Note  PCP-Cardiologist: Dietrich Pates, MD   Subjective:    Admitted with marked volume overload. Started on IV lasix + metolazone. Brisk diuresis noted. Weight down 7 pounds again today.  CVP is about 15 (difficult with respiratory variation).   Breathing is better.   Objective:   Weight Range: (!) 187.3 kg Body mass index is 57.6 kg/m.   Vital Signs:   Temp:  [97.8 F (36.6 C)-98.7 F (37.1 C)] 98.6 F (37 C) (11/12 0431) Pulse Rate:  [81-92] 92 (11/12 0431) Resp:  [18-24] 24 (11/12 0431) BP: (131-159)/(83-99) 131/83 (11/12 0431) SpO2:  [88 %-94 %] 92 % (11/12 0431) Weight:  [187.3 kg] 187.3 kg (11/12 0431) Last BM Date: 07/28/19  Weight change: Filed Weights   07/30/19 1453 07/31/19 0358 08/01/19 0431  Weight: (!) 193.8 kg (!) 190.7 kg (!) 187.3 kg    Intake/Output:   Intake/Output Summary (Last 24 hours) at 08/01/2019 0736 Last data filed at 08/01/2019 0606 Gross per 24 hour  Intake 942 ml  Output 6350 ml  Net -5408 ml      Physical Exam    General: Obese Neck: Thick, JVP difficult but probably elevated, no thyromegaly or thyroid nodule.  Lungs: Clear to auscultation bilaterally with normal respiratory effort. CV: Nonpalpable PMI.  Heart regular S1/S2, no S3/S4, no murmur.  Unna boots lower legs with chronic edema.   Abdomen: Soft, nontender, no hepatosplenomegaly, no distention.  Skin: Intact without lesions or rashes.  Neurologic: Alert and oriented x 3.  Psych: Normal affect. Extremities: No clubbing or cyanosis.  HEENT: Normal.    Telemetry   NSR 80s Personally reviewed.    EKG    n/a  Labs    CBC Recent Labs    07/30/19 1225  WBC 8.3  HGB 9.3*  HCT 33.4*  MCV 76.3*  PLT 433*   Basic Metabolic Panel Recent Labs    99/24/26 0330 08/01/19 0453  NA 138 138  K 3.4* 3.4*  CL 95* 95*  CO2 32 33*  GLUCOSE 132* 122*   BUN 10 15  CREATININE 1.13 1.26*  CALCIUM 8.8* 9.2  MG  --  1.7   Liver Function Tests Recent Labs    07/30/19 1225  AST 18  ALT 14  ALKPHOS 47  BILITOT 0.4  PROT 7.1  ALBUMIN 3.5   No results for input(s): LIPASE, AMYLASE in the last 72 hours. Cardiac Enzymes No results for input(s): CKTOTAL, CKMB, CKMBINDEX, TROPONINI in the last 72 hours.  BNP: BNP (last 3 results) Recent Labs    05/28/19 1108 07/16/19 1730 07/30/19 1225  BNP 25.1 29.4 39.3    ProBNP (last 3 results) No results for input(s): PROBNP in the last 8760 hours.   D-Dimer No results for input(s): DDIMER in the last 72 hours. Hemoglobin A1C No results for input(s): HGBA1C in the last 72 hours. Fasting Lipid Panel No results for input(s): CHOL, HDL, LDLCALC, TRIG, CHOLHDL, LDLDIRECT in the last 72 hours. Thyroid Function Tests Recent Labs    07/30/19 1225  TSH 2.293    Other results:   Imaging    No results found.   Medications:     Scheduled Medications: . amLODipine  10 mg Oral Daily  . atorvastatin  20 mg Oral QHS  . Chlorhexidine Gluconate Cloth  6 each Topical Daily  . docusate sodium  100  mg Oral BID  . ferrous sulfate  325 mg Oral Daily  . furosemide  80 mg Intravenous Q12H  . labetalol  200 mg Oral BID  . pantoprazole  40 mg Oral Daily  . potassium chloride SA  40 mEq Oral TID  . rivaroxaban  20 mg Oral Q supper  . sodium chloride flush  10-40 mL Intracatheter Q12H  . sodium chloride flush  3 mL Intravenous Q12H  . spironolactone  12.5 mg Oral Daily    Infusions: . sodium chloride      PRN Medications: sodium chloride, acetaminophen, albuterol, hydrALAZINE, sodium chloride flush, sodium chloride flush     Assessment/Plan   1. Acute on chronic diastolic CHF: Echo in 82/99 with EF 65-70%, RV interpreted as normal but suspect significant RV dysfunction.  He failed outpatient diuretic regimen due to high sodium diet. Co-ox low today but done in early am while  sleeping, suspect O2 sats low (also may reflect RV failure).  CVP probably about 15 today.  Diuresing well, mild rise in creatinine to 1.2.  - Continue lasix 80 mg IV bid with 1 dose of metolazone again today.  Probably back to po tomorrow.  - Continue 12.5 mg spironolactone daily.  Replace K.   - Hold lisinopril with mild rise in creatinine.    - Placed Unna boots.  2. Atrial fibrillation: Paroxysmal.  Maintaining NSR.   - Continue Xarelto.  3. H/o PE: 5/20, continue Xarelto.  4. OHS/OSA: Continue CPAP at night and oxygen during the day.   Ambulate in hall.   Length of Stay: 2  Loralie Champagne, MD  08/01/2019, 7:36 AM  Advanced Heart Failure Team Pager [phone] (M-F; 7a - 4p)  Please contact [facility] for night-coverage after hours (4p -7a ) and weekends on [email or web address]

## 2019-08-01 NOTE — Progress Notes (Signed)
Came by where pt stays to swap out his 02 tanks for him--he is still admitted and I will not be working tomor--and I also filled up his pill box according to epic list presently,  I will check on his d/c instructions Monday morning when I return and go out for any changes. He also will need xarelto. I was able to grab a sample until we get the pharmacy to fill next week.   [name], EMT-Paramedic  08/01/19

## 2019-08-01 NOTE — Progress Notes (Signed)
Paged [facility] at [phone]. Pt. constipated; requesting Miralax? Miralax ordered.

## 2019-08-01 NOTE — TOC Progression Note (Addendum)
Transition of Care [facility]) - Progression Note    Patient Details  Name: Benjamin [name] MRN: [id number] Date of Birth: 1966-03-03  Transition of Care [facility] CM/SW Contact  Zenon Mayo, RN Phone Number: 08/01/2019, 1:23 PM  Clinical Narrative:    NCM notified Staff RN , will need ambulatory sats to see if patient will need home oxygen.  Awaiting sats. Also [name] with HF is following patient for housing.   He also goes to [facility]. He is with  Paramedicine . NCM left vm for Jane with CHW clinic regarding patient.        Expected Discharge Plan and Services                                                 Social Determinants of Health (SDOH) Interventions    Readmission Risk Interventions Readmission Risk Prevention Plan 07/18/2019 06/13/2019 04/19/2019  Transportation Screening Complete Complete Complete  PCP or Specialist Appt within 3-5 Days - - Complete  HRI or [place] - - Complete  Social Work Consult for [place] Planning/Counseling - - Complete  Palliative Care Screening - - Not Applicable  Medication Review [employer]) Complete Complete Complete  PCP or Specialist appointment within 3-5 days of discharge Complete Complete -  [place] or Home Care Consult Complete Complete -  SW Recovery Care/Counseling Consult Complete Complete -  Palliative Care Screening Not Applicable Not Applicable -  [place] Not Applicable Not Applicable -  Some recent data might be hidden

## 2019-08-01 NOTE — Progress Notes (Signed)
CSW following pt in outpatient setting through the [employer].  Met with pt at bedside to check in and to assist with more housing applications while he is in house.  Patient reports feeling much better today but still has some more fluid to get off at this time.  Pt was speaking to nutritionist when CSW walked in and feels like this has helped to clarify how he should be eating to avoid further fluid retention problems in the future.  Pt continues to be very frustrated by his inability to get housing and the concerns with his social security being stopped until he moves into housing that he pays for.  Pt states this frustration lead to him eating poorly but realizes this is not a healthy coping mechanism.  CSW provided active listening and support.  CSW will continue to follow in outpatient setting and assist as needed  [name], [facility] Desk#: [phone] Cell#: [phone]

## 2019-08-01 NOTE — Progress Notes (Addendum)
SATURATION QUALIFICATIONS: (This note is used to comply with regulatory documentation for home oxygen)  Patient Saturations on Room Air at Rest = 92%  Patient Saturations on Room Air while Ambulating = 86%  Patient Saturations on 2 Liters of oxygen while Ambulating = 96%  Please briefly explain why patient needs home oxygen: pt. SOB and stating "need to catch my breath" after ambulating. Patient stated he needs portable home oxygen.

## 2019-08-01 NOTE — Plan of Care (Signed)
Consult for CHF education regarding a low sodium diet.  Pt provided with "Low Sodium Nutrition Therapy" handout. Pt stated he typically eats 3-4 sausage biscuit and gravy for breakfast, rib-eye steak for lunch or a pizza and typically bojagles chicken, fries and mac and cheese for dinner. Pt stated that due to changing his living situation he is currently dependent on dining out. We discussed ways to navigate take out menus to find low sodium foods and different options he would enjoy. Pt stated his case worker is helping him to secure housing and once that is completed he has an aide coming to cook heart healthy meals. Pt stated willingness and desire to make changes to his diet. We discussed consuming more fresh fruits and vegetables, avoiding processed foods, rinsing canned vegetables when he is able to cook, selecting low sodium deli meats, choosing low sodium, reduced sodium and no salt added products as well as using various spices to enhance flavor. We thoroughly reviewed the handout together and discussed the importance of following a low sodium diet to reduce fluid retention and pt was able to teach back.  Expect fair to good compliance.  BMI 57.6. Pt meets criteria for obesity class 3 based on current BMI.  Pt is currently on a 2gm Sodium diet and is consuming 100%.  Medications reviewed: norvasc 103m, lipitor 249m colace 10073mferrous sulfate 325 mg, lasix 80 mg, normodyne 200m42mrotonix 40mg13mor-con 40mEq58mrelto 20mg, 52mctone 12.5mg  La23m Glucose 130, Hgb A1C 6.1, Potassium 3.4  No further nutrition interventions warranted at this time. If additional nutrition issues arise, please re-consult RD.   Madalynne Gutmann [facility] Intern Pager # [phone]

## 2019-08-01 NOTE — Progress Notes (Signed)
Patient refused the use of CPAP for the evening. Will continue to monitor

## 2019-08-02 ENCOUNTER — Other Ambulatory Visit (HOSPITAL_COMMUNITY): Payer: Self-pay

## 2019-08-02 LAB — COOXEMETRY PANEL
Carboxyhemoglobin: 1.3 % (ref 0.5–1.5)
Methemoglobin: 0.6 % (ref 0.0–1.5)
O2 Saturation: 57.2 %
Total hemoglobin: 10.1 g/dL — ABNORMAL LOW (ref 12.0–16.0)

## 2019-08-02 LAB — BASIC METABOLIC PANEL
Anion gap: 12 (ref 5–15)
BUN: 17 mg/dL (ref 6–20)
CO2: 35 mmol/L — ABNORMAL HIGH (ref 22–32)
Calcium: 9.3 mg/dL (ref 8.9–10.3)
Chloride: 91 mmol/L — ABNORMAL LOW (ref 98–111)
Creatinine, Ser: 1.22 mg/dL (ref 0.61–1.24)
GFR calc Af Amer: >60 mL/min (ref >60)
GFR calc non Af Amer: >60 mL/min (ref >60)
Glucose, Bld: 128 mg/dL — ABNORMAL HIGH (ref 70–99)
Potassium: 3.5 mmol/L (ref 3.5–5.1)
Sodium: 138 mmol/L (ref 135–145)

## 2019-08-02 LAB — MAGNESIUM: Magnesium: 1.8 mg/dL (ref 1.7–2.4)

## 2019-08-02 MED ORDER — POLYVINYL ALCOHOL 1.4 % OP SOLN
1.0000 [drp] | OPHTHALMIC | Status: DC | PRN
  Administered 2019-08-02: 1 [drp] via OPHTHALMIC
  Filled 2019-08-02: qty 15

## 2019-08-02 MED ORDER — SPIRONOLACTONE 25 MG PO TABS: 12.5000 mg | ORAL_TABLET | Freq: Every day | ORAL | 3 refills | Status: DC

## 2019-08-02 MED ORDER — LABETALOL HCL 200 MG PO TABS
400.0000 mg | ORAL_TABLET | Freq: Two times a day (BID) | ORAL | Status: DC
  Administered 2019-08-02: 400 mg via ORAL
  Filled 2019-08-02: qty 2

## 2019-08-02 MED ORDER — LABETALOL HCL 200 MG PO TABS: 400.0000 mg | ORAL_TABLET | Freq: Two times a day (BID) | ORAL | 3 refills | Status: DC

## 2019-08-02 MED ORDER — RIVAROXABAN 20 MG PO TABS: 20.0000 mg | ORAL_TABLET | Freq: Every day | ORAL | 6 refills | Status: DC

## 2019-08-02 MED ORDER — TORSEMIDE 100 MG PO TABS: 100.0000 mg | ORAL_TABLET | Freq: Two times a day (BID) | ORAL | Status: DC

## 2019-08-02 NOTE — Discharge Summary (Signed)
Advanced Heart Failure Team  Discharge Summary   Patient ID: Benjamin [name] MRN: [id number], DOB/AGE: 1966/03/16 53 y.o. Admit date: 07/30/2019 D/C date:     08/02/2019   Primary Discharge Diagnoses:  Chronic Diastolic Heart Failure Paroxysmal Atrial Fibrillation  Chronic Anticoagulation (Xarelto) H/o PE OHS/OSA Morbid Obesity  Renal Insuffiencey  Essential Hypertension  Type 2 Diabetes    Pertinent Medical History Summary & Hospital Course:   [name] a 53 yo with hx of diastolic CHF, HTN, DM, Atrial fib, PE in April 2020 (on Xarelto), OSA, GERD,chronic elevation of troponin.  Admitted 01/21/2019 with increased dyspnea and had PE. He was placed on back on coumadin.  Patient re-admitted 5/20 for syncope/cough/dyspnea, COVID+, CT positive for bilateral GGOs, admitted to [facility] and discharged after 6 days, on O2. Rec'd Cards f/u.  Patient re-admitted 02/20/19 for bright red blood per rectum, eval'ed by GI, had EGD/colon, dx'd with hemorrhoidal bleeding, discharged after 9 days on O2.He was again Covid + .  Readmitted 03/11/19 with increased shortness of breath. He did not have home oxygen. He was discharged to home the next day.  He [facility] ED[facility]20with lower extremity edema. COVID negative. Diuresed with lasix drip and transitioned to torsemide 40 mg twice daily. Had RHC as noted below with mild volume overload and preserved cardiac output. Discharge weight was 407.9 pounds.   He was readmitted with symptomatic anemia 9/8 with hgb 7.1. Received 1UPRBCs. GI consulted. He started on anusol and continued on stool softner.  He had f/u visit 10/20 and was complaining of increased leg edema and weight gain. SOB with exertion. DeniedPND. + Orthopnea. Nobleeding issues.Was eating high sodium food --> [employer] jangles chicken. No fever or chills. Taking all medications. Followed closely by HF Paramedicine.He was given a dose of IV Lasix in clinic, 80 mg w/ >1300  urine output after IV lasix.His home torsemide dose was increased to 100 bid and he was instructed to f/u in 2 weeks.   Unfortunately, he was readmitted to the hospital on 10/27 when he presented to the ED w/ worsening symptoms and was admitted by IM for a/c dCHF and treated w/ IV Lasix. Had 2 day hospital stay and was discharged on 10/29. Echo was done 10/28 and showed normal LVEF 65-70%, RV interpreted as normal but suspect significant RV dysfunction.  After diuresis w/ IV Lasix, he was placed back on home diuretic regimen, torsemide 100 mg bid. Lisinopril 5 mg was also added to regimen. AHF team was not consulted that admit.  He was seen back in clinic 07/30/19. Felt poorly. SOB at rest and worse w/ exertion. Also w/ orthopnea/PND. Significant wt gain of 20 lb up to 428 lb (dry wt ~407 lb), w/ marked abdominal distention and LEE and poor response to IV diuretics. He was directly admitted from clinic to [facility] for IV diuretics.   He was placed on IV Lasix 80 mg bid w/ great response. PICC line was placed to measure CVP. Net diuresis over 15 L. Diuresed down from admit wt of 427 lb to dry wt of 408 lb at discharge. CVP down to 8-9. He was transitioned back to PO torsemide 100 mg bid. He had slight bump in SCr to 1.2 and lisinopril was discontinued. Labetalol increased to 400 mg bid. Spironolactone was also added.   On 08/02/19, he was seen and examined by Dr. Aundra Dubin and felt stable for discharge home. Order was placed for [facility] RN to assist w/ wrapping legs. He will continue w/ paramedicine.  He was advised to reduce sodium intake. Post hospital clinic f/u has been arranged 11/23.    Discharge Weight Range: 408 lb  Discharge Vitals: Blood pressure 116/65, pulse 86, temperature 98.6 F (37 C), temperature source Oral, resp. rate 17, height 5' 11"  (1.803 m), weight (!) 185.3 kg, SpO2 96 %.  Labs: Lab Results  Component Value Date   WBC 8.3 07/30/2019   HGB 9.3 (L) 07/30/2019   HCT 33.4 (L)  07/30/2019   MCV 76.3 (L) 07/30/2019   PLT 433 (H) 07/30/2019    Recent Labs  Lab 07/30/19 1225  08/02/19 0525  NA 138   < > 138  K 3.5   < > 3.5  CL 100   < > 91*  CO2 29   < > 35*  BUN 8   < > 17  CREATININE 1.00   < > 1.22  CALCIUM 8.8*   < > 9.3  PROT 7.1  --   --   BILITOT 0.4  --   --   ALKPHOS 47  --   --   ALT 14  --   --   AST 18  --   --   GLUCOSE 105*   < > 128*   < > = values in this interval not displayed.   Lab Results  Component Value Date   TRIG 83 03/12/2019   BNP (last 3 results) Recent Labs    05/28/19 1108 07/16/19 1730 07/30/19 1225  BNP 25.1 29.4 39.3    ProBNP (last 3 results) No results for input(s): PROBNP in the last 8760 hours.   Diagnostic Studies/Procedures   No results found.  Discharge Medications   Allergies as of 08/02/2019   No Known Allergies     Medication List    STOP taking these medications   lisinopril 5 MG tablet Commonly known as: ZESTRIL     TAKE these medications   albuterol 108 (90 Base) MCG/ACT inhaler Commonly known as: VENTOLIN HFA Inhale 2 puffs into the lungs every 6 (six) hours as needed for wheezing or shortness of breath.   amLODipine 10 MG tablet Commonly known as: NORVASC Take 1 tablet (10 mg total) by mouth daily.   atorvastatin 20 MG tablet Commonly known as: LIPITOR Take 1 tablet (20 mg total) by mouth at bedtime.   carboxymethylcellulose 0.5 % Soln Commonly known as: REFRESH PLUS Place 1 drop into both eyes daily. Notes to patient: 0900   docusate sodium 100 MG capsule Commonly known as: COLACE Take 1 capsule (100 mg total) by mouth 2 (two) times daily.   ferrous sulfate 325 (65 FE) MG tablet Take 1 tablet (325 mg total) by mouth daily. Patient will pick up scripts today. Notes to patient: 11/14   labetalol 200 MG tablet Commonly known as: NORMODYNE Take 2 tablets (400 mg total) by mouth 2 (two) times daily. What changed: how much to take Notes to patient: 11/13 pm    levalbuterol 45 MCG/ACT inhaler Commonly known as: XOPENEX HFA Inhale 2 puffs into the lungs every 4 (four) hours as needed for wheezing.   metFORMIN 500 MG tablet Commonly known as: GLUCOPHAGE Take 1 tablet (500 mg total) by mouth 2 (two) times daily with a meal. Patient will pick up scripts today.   Misc. Devices Misc Please provide Mr. Azeez with insurance approved portable O2 concentrator ICD 10 J96.11 Z99.81   Misc. Devices Misc Requires O2 @ 3L/min continuously via nasal canula and home fill system,.  omeprazole 20 MG capsule Commonly known as: PRILOSEC Take 1 capsule (20 mg total) by mouth daily.   potassium chloride SA 20 MEQ tablet Commonly known as: KLOR-CON Take 2 tablets (40 mEq total) by mouth 2 (two) times daily.   rivaroxaban 20 MG Tabs tablet Commonly known as: XARELTO Take 1 tablet (20 mg total) by mouth daily with supper.   spironolactone 25 MG tablet Commonly known as: ALDACTONE Take 0.5 tablets (12.5 mg total) by mouth daily. Start taking on: August 03, 2019   torsemide 100 MG tablet Commonly known as: DEMADEX Take 1 tablet (100 mg total) by mouth 2 (two) times daily.   True Metrix Blood Glucose Test test strip Generic drug: glucose blood Use as instructed. Check blood glucose level by fingerstick twice per day.   True Metrix Meter w/Device Kit Use as instructed. Check blood glucose level by fingerstick twice per day.   TRUEplus Lancets 28G Misc Use as instructed. Check blood glucose level by fingerstick twice per day.       Disposition   The patient will be discharged in stable condition to home. Discharge Instructions    Face-to-face encounter (required for Medicare/Medicaid patients)   Complete by: As directed    I Lyda Jester certify that this patient is under my care and that I, or a nurse practitioner or physician's assistant working with me, had a face-to-face encounter that meets the physician face-to-face encounter  requirements with this patient on 08/02/2019. The encounter with the patient was in whole, or in part for the following medical condition(s) which is the primary reason for home health care (List medical condition): chronic CHF. Needs assistance w/ leg wraps (unna boots) for chronic edema   The encounter with the patient was in whole, or in part, for the following medical condition, which is the primary reason for home health care: CHF   I certify that, based on my findings, the following services are medically necessary home health services: Nursing   Reason for Medically Necessary Home Health Services: Skilled Nursing- Change/Decline in Patient Status   My clinical findings support the need for the above services: Shortness of breath with activity   Further, I certify that my clinical findings support that this patient is homebound due to: Shortness of Breath with activity   Home Health   Complete by: As directed    To provide the following care/treatments: [place] Follow up on 08/12/2019.   Specialty: Cardiology Why: 10:00 am parking garage code [place] information: [address] [id number] [place] [phone]            Duration of Discharge Encounter: Greater than 35 minutes   Signed, Nelida Gores 08/02/2019, 1:41 PM

## 2019-08-02 NOTE — Progress Notes (Signed)
Patient plans are for discharge MD explained that it will afternoon however patient is anxious to leave. PICC Line removal order placed by Cardiologist [place] PA. Discharge orders to follow.  Unna wraps removed and patient would like to take a shower and requested for them to stay off.

## 2019-08-02 NOTE — Progress Notes (Signed)
   Benjamin [name] had several medication changes upon discharge. Benjamin [name] called and that the prescriptions had been called in to [facility].    I revised his pill box according the discharge instructions.  I put the .5 spirolactone in with the evening dose.  Lisinopril was discontinued and the labetalol was increased to 2 tabs twice daily.  He already had 1 tab twice daily so I added just one to both morning and evening dose.  The pharmacy delivered the new prescription of labetalol; those pills weren't included.  The pills that were added to his pill box was from the bottle that he was already working out of.    I explained all of the changes to Benjamin [name]. He said that he's going to take his medications in the morning because "they gave him a lot of medicines before he left the hospital and he can't remember what they were".

## 2019-08-02 NOTE — TOC Transition Note (Signed)
Transition of Care [facility]) - CM/SW Discharge Note   Patient Details  Name: Benjamin [name] MRN: [id number] Date of Birth: 11-Aug-1966  Transition of Care [facility]) CM/SW Contact:  Zenon Mayo, RN Phone Number: 08/02/2019, 1:00 PM   Clinical Narrative:    Patient for discharge today, he has home oxygen with [employer] and Edwyna Ready will bring a tank to patient room to go home with.  Also NCM spoke with [place] PA, she states patient can go home with Clare Gandy hose,  NCM informed Staff RN Melva.  Patient is good to be discharged when that has been done.   Final next level of care: Home/Self Care Barriers to Discharge: No Barriers Identified   Patient Goals and CMS Choice Patient states their goals for this hospitalization and ongoing recovery are:: go home   Choice offered to / list presented to : NA  Discharge Placement                       Discharge Plan and Services                DME Arranged: Oxygen DME Agency: [employer] Date DME Agency Contacted: 08/02/19 Time DME Agency Contacted: 42 Representative spoke with at DME Agency: zack HH Arranged: NA          Social Determinants of Health ([place]) Interventions     Readmission Risk Interventions Readmission Risk Prevention Plan 07/18/2019 06/13/2019 04/19/2019  Transportation Screening Complete Complete Complete  PCP or Specialist Appt within 3-5 Days - - Complete  HRI or [place] - - Complete  Social Work Consult for [place] Planning/Counseling - - Complete  Palliative Care Screening - - Not Applicable  Medication Review Press photographer) Complete Complete Complete  PCP or Specialist appointment within 3-5 days of discharge Complete Complete -  [place] or Home Care Consult Complete Complete -  SW Recovery Care/Counseling Consult Complete Complete -  Palliative Care Screening Not Applicable Not Applicable -  [place] Not Applicable Not Applicable -  Some recent data might be hidden

## 2019-08-02 NOTE — Progress Notes (Signed)
Patient ID: Benjamin [name], male   DOB: May 29, 1966, 53 y.o.   MRN: [id number]     Advanced Heart Failure Rounding Note  PCP-Cardiologist: Dorris Carnes, MD   Subjective:    Admitted with marked volume overload. Started on IV lasix + metolazone. Brisk diuresis noted. Weight down another 5 pounds again today.  CVP is about 8-9 (difficult with respiratory variation).   Breathing is better.   Objective:   Weight Range: (!) 185.3 kg Body mass index is 56.97 kg/m.   Vital Signs:   Temp:  [97.8 F (36.6 C)-99 F (37.2 C)] 97.8 F (36.6 C) (11/13 0805) Pulse Rate:  [79-96] 80 (11/13 0805) Resp:  [14-23] 16 (11/13 0805) BP: (116-154)/(65-107) 116/65 (11/13 0805) SpO2:  [92 %-98 %] 98 % (11/13 0805) Weight:  [185.3 kg] 185.3 kg (11/13 0416) Last BM Date: 08/01/19  Weight change: Filed Weights   07/31/19 0358 08/01/19 0431 08/02/19 0416  Weight: (!) 190.7 kg (!) 187.3 kg (!) 185.3 kg    Intake/Output:   Intake/Output Summary (Last 24 hours) at 08/02/2019 0825 Last data filed at 08/02/2019 0806 Gross per 24 hour  Intake 2503 ml  Output 4225 ml  Net -1722 ml      Physical Exam    General: NAD, obese Neck: Thick, JVP difficult, no thyromegaly or thyroid nodule.  Lungs: Clear to auscultation bilaterally with normal respiratory effort. CV: Nondisplaced PMI.  Heart regular S1/S2, no S3/S4, no murmur.  Chronic LE edema, wearing unna boots.   Abdomen: Soft, nontender, no hepatosplenomegaly, no distention.  Skin: Intact without lesions or rashes.  Neurologic: Alert and oriented x 3.  Psych: Normal affect. Extremities: No clubbing or cyanosis.  HEENT: Normal.    Telemetry   NSR 80s Personally reviewed.    EKG    n/a  Labs    CBC Recent Labs    07/30/19 1225  WBC 8.3  HGB 9.3*  HCT 33.4*  MCV 76.3*  PLT 440*   Basic Metabolic Panel Recent Labs    08/01/19 0453 08/02/19 0525  NA 138 138  K 3.4* 3.5  CL 95* 91*  CO2 33* 35*  GLUCOSE 122* 128*  BUN  15 17  CREATININE 1.26* 1.22  CALCIUM 9.2 9.3  MG 1.7 1.8   Liver Function Tests Recent Labs    07/30/19 1225  AST 18  ALT 14  ALKPHOS 47  BILITOT 0.4  PROT 7.1  ALBUMIN 3.5   No results for input(s): LIPASE, AMYLASE in the last 72 hours. Cardiac Enzymes No results for input(s): CKTOTAL, CKMB, CKMBINDEX, TROPONINI in the last 72 hours.  BNP: BNP (last 3 results) Recent Labs    05/28/19 1108 07/16/19 1730 07/30/19 1225  BNP 25.1 29.4 39.3    ProBNP (last 3 results) No results for input(s): PROBNP in the last 8760 hours.   D-Dimer No results for input(s): DDIMER in the last 72 hours. Hemoglobin A1C No results for input(s): HGBA1C in the last 72 hours. Fasting Lipid Panel No results for input(s): CHOL, HDL, LDLCALC, TRIG, CHOLHDL, LDLDIRECT in the last 72 hours. Thyroid Function Tests Recent Labs    07/30/19 1225  TSH 2.293    Other results:   Imaging    No results found.   Medications:     Scheduled Medications: . amLODipine  10 mg Oral Daily  . atorvastatin  20 mg Oral QHS  . Chlorhexidine Gluconate Cloth  6 each Topical Daily  . docusate sodium  100 mg Oral BID  .  ferrous sulfate  325 mg Oral Daily  . labetalol  400 mg Oral BID  . pantoprazole  40 mg Oral Daily  . potassium chloride SA  40 mEq Oral TID  . rivaroxaban  20 mg Oral Q supper  . sodium chloride flush  10-40 mL Intracatheter Q12H  . sodium chloride flush  3 mL Intravenous Q12H  . spironolactone  12.5 mg Oral Daily  . torsemide  100 mg Oral BID    Infusions: . sodium chloride    . sodium chloride Stopped (08/01/19 1240)    PRN Medications: sodium chloride, sodium chloride, acetaminophen, albuterol, hydrALAZINE, polyethylene glycol, polyvinyl alcohol, sodium chloride flush, sodium chloride flush     Assessment/Plan   1. Acute on chronic diastolic CHF: Echo in 10/20 with EF 65-70%, RV interpreted as normal but suspect significant RV dysfunction.  He failed outpatient  diuretic regimen due to high sodium diet.  Diuresing well, creatinine stable today at 1.22.  CVP around 8-9, weight is down about 20 lbs.  - He got IV Lasix this morning, will convert back to torsemide 100 mg po bid.    - Continue 12.5 mg spironolactone daily.  Replace K.   - Hold lisinopril with mild rise in creatinine.    - Placed Unna boots.  - Discussion again about sodium restriction in diet.  2. Atrial fibrillation: Paroxysmal.  Maintaining NSR.   - Continue Xarelto.  3. H/o PE: 5/20, continue Xarelto.  4. OHS/OSA: Continue CPAP at night and oxygen during the day.  5. Disposition: I think that he can go home today.  Needs close followup CHF clinic.  Will see if we can get home health to continue wrapping his legs.  Meds for home: torsemide 100 mg bid, KCl 40 bid, Xarelto 20 daily, labetalol 400 mg bid, amlodipine 10 mg daily, spironolactone 12.5 daily, atorvastatin 20 daily.  He will stay off lisinopril for now.   Length of Stay: 3  Marca Ancona, MD  08/02/2019, 8:25 AM  Advanced Heart Failure Team Pager [phone] (M-F; 7a - 4p)  Please contact [facility] for night-coverage after hours (4p -7a ) and weekends on [email or web address]

## 2019-08-02 NOTE — Progress Notes (Signed)
CSW following pt through outpatient [employer] program.  Patient discharging today with some medication changes- CSW assisting in coordinating with Community paramedic to go out when patient discharges to complete medication changes.  CSW will continue to follow and assist as needed  [name], [place] Desk#: [phone] Cell#: [phone]

## 2019-08-02 NOTE — Progress Notes (Signed)
Discharge information given and  Complete waiting for portable  02.

## 2019-08-05 ENCOUNTER — Encounter (HOSPITAL_COMMUNITY): Payer: Self-pay

## 2019-08-05 ENCOUNTER — Telehealth: Payer: Self-pay

## 2019-08-05 ENCOUNTER — Telehealth (HOSPITAL_COMMUNITY): Payer: Self-pay | Admitting: Licensed Clinical Social Worker

## 2019-08-05 NOTE — Telephone Encounter (Signed)
Pt received notification that he has been denied for [employer] due to his felony conviction within the past 10 years.    Patient going to look at 2 different apartments today and CSW calling apt site at [facility] [phone])- left message regarding availability   CSW also faxed landlord attestation from another apt site to be filled out by case worker at [facility].  CSW will continue to follow and assist as needed  [name], [facility] Desk#: [phone] Cell#: [phone]

## 2019-08-05 NOTE — Telephone Encounter (Signed)
From the discharge call.  He has an appt with Dr Margarita Rana 08/14/2019.    He has all medications and has no questions about his medication orders.  Dee Copper, EMT made home visit on day of discharge to update medication box according to the discharge instructions.    Lives in boarding house and is there until the beginning of December.  He has been looking for available apartments and so far has not had any luck being approved for an apt. [name], LCSW/HF clinic has been providing guidance with this process  He said that home health would not accept him. He is using compression stockings in lieu of having his legs wrapped.  O2 from [place]. Currently using @ 3L continuously.  would like portable O2 concentrator

## 2019-08-05 NOTE — Telephone Encounter (Signed)
Transition Care Management Follow-up Telephone Call  Date of discharge and from where: 08/02/2019, [facility]   How have you been since you were released from the hospital? He said the is " doing all right" trying to watch what he eats... said he is eating salad instead of spaghetti and trying to eat less.   Any questions or concerns? None reported at this time  Items Reviewed:  Did the pt receive and understand the discharge instructions provided? Yes he has them.  No questions   Medications obtained and verified? He has all medications and has no questions about his medication orders.  Dee Copper, EMT made home visit on day of discharge to update medication box according to the discharge instructions.   Any new allergies since your discharge? None reported  Do you have support at home? Lives in boarding house and is there until the beginning of December.  He has been looking for available apartments and so far has not had any luck being approved for an apt. [name], LCSW/HF clinic has been providing guidance with this process  Other (ie: DME, Home Health, etc)   He said that home health would not accept him. He is using compression stockings in lieu of having his legs wrapped.  Has scale - weight 410 lbs this morning, stated that is what is usually weighs.   Has glucometer. Blood sugar 96 this morning.  Has CPAP from [place],EMT visits him weekly.   O2 from [place]. Currently using @ 3L continuously.  Still would like a POC, will discuss at upcoming appt with Dr Margarita Rana.   Functional Questionnaire: (I = Independent and D = Dependent) ADL's:  independent with ADLs   Follow up appointments reviewed:    PCP Hospital f/u appt confirmed? Has appointment with Dr Margarita Rana 08/14/2019 @ [facility] f/u appt confirmed? appt at Heart Failure clinic 08/12/2019 @ 1000  Are transportation arrangements needed? He said that he  needs to call medicaid to schedule transportation,   If their condition worsens, is the pt aware to call  their PCP or go to the ED? yes  Was the patient provided with contact information for the PCP's office or ED? He has the clinic phone number and is also in frequent contact with the heart failure clinic  Was the pt encouraged to call back with questions or concerns? yes

## 2019-08-06 ENCOUNTER — Telehealth (HOSPITAL_COMMUNITY): Payer: Self-pay | Admitting: Licensed Clinical Social Worker

## 2019-08-06 ENCOUNTER — Other Ambulatory Visit (HOSPITAL_COMMUNITY): Payer: Self-pay

## 2019-08-06 NOTE — Telephone Encounter (Addendum)
CSW informed by community paramedic that pt was not able to be set up with home health at discharge to come wrap his legs.  CSW confirmed with TOC case manager that the home health agency did not have sufficient staff to see patient so he was sent home with TED hose.  Paramedic informed CSW that TED hose have been ripped so he needs a new pair- hose ordered using patient care fund- should arrive in 1-2 days.  CSW will continue to follow and assist as needed  [name], [place] [facility] Desk#: [phone] Cell#: [phone]

## 2019-08-06 NOTE — Progress Notes (Signed)
Paramedicine Encounter    Patient ID: Benjamin [name], male    DOB: Apr 23, 1966, 53 y.o.   MRN: [id number]   Patient Care Team: [name], NP as PCP - General (Nurse Practitioner) [name], MD as PCP - Cardiology (Cardiology) [name], LCSW as Social Worker (Licensed Clinical Social Worker)  Patient Active Problem List   Diagnosis Date Noted  . Acute on chronic diastolic CHF (congestive heart failure), NYHA class 3 ([place]) 07/30/2019  . Diabetes mellitus without complication ([place]) 50/38/8828  . Hypertensive emergency   . BRBPR (bright red blood per rectum) 06/12/2019  . Hypertension   . CHF (congestive heart failure) ([place])   . Sleep apnea   . Chronic respiratory failure ([place])   . Lower GI bleeding 06/11/2019  . Hypokalemia 05/29/2019  . GI bleed 05/29/2019  . Anemia 05/28/2019  . Acute on chronic diastolic CHF (congestive heart failure) ([place]) 04/11/2019  . Iron deficiency anemia 03/11/2019  . OSA on CPAP 03/11/2019  . HLD (hyperlipidemia) 03/11/2019  . Elevated troponin 03/11/2019  . GERD (gastroesophageal reflux disease) 03/11/2019  . Rectal bleeding 02/20/2019  . Dyspnea 02/06/2019  . COVID-19 virus infection 02/06/2019  . Bilateral lower extremity edema   . Morbid obesity with BMI of 50.0-59.9, adult ([place])   . Chronic diastolic CHF (congestive heart failure) ([place])   . PAF (paroxysmal atrial fibrillation) ([place])   . PE (pulmonary thromboembolism) ([place]) 01/21/2019  . Hypertensive urgency 01/21/2019  . Diabetes mellitus type 2 in obese ([place]) 01/21/2019    Current Outpatient Medications:  .  albuterol (VENTOLIN HFA) 108 (90 Base) MCG/ACT inhaler, Inhale 2 puffs into the lungs every 6 (six) hours as needed for wheezing or shortness of breath., Disp: 18 g, Rfl: 0 .  amLODipine (NORVASC) 10 MG tablet, Take 1 tablet (10 mg total) by mouth daily., Disp: 90 tablet, Rfl: 1 .  atorvastatin (LIPITOR) 20 MG tablet, Take 1 tablet (20 mg total) by mouth at bedtime.,  Disp: 90 tablet, Rfl: 2 .  Blood Glucose Monitoring Suppl (TRUE METRIX METER) w/Device KIT, Use as instructed. Check blood glucose level by fingerstick twice per day., Disp: 1 kit, Rfl: 0 .  carboxymethylcellulose (REFRESH PLUS) 0.5 % SOLN, Place 1 drop into both eyes daily. , Disp: , Rfl:  .  docusate sodium (COLACE) 100 MG capsule, Take 1 capsule (100 mg total) by mouth 2 (two) times daily., Disp: 180 capsule, Rfl: 1 .  ferrous sulfate 325 (65 FE) MG tablet, Take 1 tablet (325 mg total) by mouth daily. Patient will pick up scripts today., Disp: 90 tablet, Rfl: 0 .  glucose blood (TRUE METRIX BLOOD GLUCOSE TEST) test strip, Use as instructed. Check blood glucose level by fingerstick twice per day., Disp: 100 each, Rfl: 12 .  labetalol (NORMODYNE) 200 MG tablet, Take 2 tablets (400 mg total) by mouth 2 (two) times daily., Disp: 120 tablet, Rfl: 3 .  levalbuterol (XOPENEX HFA) 45 MCG/ACT inhaler, Inhale 2 puffs into the lungs every 4 (four) hours as needed for wheezing. , Disp: , Rfl:  .  metFORMIN (GLUCOPHAGE) 500 MG tablet, Take 1 tablet (500 mg total) by mouth 2 (two) times daily with a meal. Patient will pick up scripts today., Disp: 180 tablet, Rfl: 1 .  Misc. Devices MISC, Please provide [name] with insurance approved portable O2 concentrator ICD 10 J96.11 Z99.81, Disp: 1 each, Rfl: 0 .  Misc. Devices MISC, Requires O2 @ 3L/min continuously via nasal canula and home fill system,.,  Disp: 1 each, Rfl: 0 .  omeprazole (PRILOSEC) 20 MG capsule, Take 1 capsule (20 mg total) by mouth daily., Disp: 30 capsule, Rfl: 3 .  rivaroxaban (XARELTO) 20 MG TABS tablet, Take 1 tablet (20 mg total) by mouth daily with supper., Disp: 60 tablet, Rfl: 6 .  spironolactone (ALDACTONE) 25 MG tablet, Take 0.5 tablets (12.5 mg total) by mouth daily. (Patient taking differently: Take 12.5 mg by mouth at bedtime. ), Disp: 30 tablet, Rfl: 3 .  torsemide (DEMADEX) 100 MG tablet, Take 1 tablet (100 mg total) by mouth 2  (two) times daily., Disp: 180 tablet, Rfl: 3 .  TRUEplus Lancets 28G MISC, Use as instructed. Check blood glucose level by fingerstick twice per day., Disp: 200 each, Rfl: 3 .  potassium chloride SA (K-DUR) 20 MEQ tablet, Take 2 tablets (40 mEq total) by mouth 2 (two) times daily., Disp: 360 tablet, Rfl: 3 No Known Allergies    Social History   Socioeconomic History  . Marital status: Single    Spouse name: Not on file  . Number of children: Not on file  . Years of education: Not on file  . Highest education level: Not on file  Occupational History  . Not on file  Social Needs  . Financial resource strain: Not on file  . Food insecurity    Worry: Not on file    Inability: Not on file  . Transportation needs    Medical: Not on file    Non-medical: Not on file  Tobacco Use  . Smoking status: Former Research scientist (life sciences)  . Smokeless tobacco: Never Used  Substance and Sexual Activity  . Alcohol use: Not Currently  . Drug use: Not Currently  . Sexual activity: Not Currently  Lifestyle  . Physical activity    Days per week: Not on file    Minutes per session: Not on file  . Stress: Not on file  Relationships  . Social Herbalist on phone: Not on file    Gets together: Not on file    Attends religious service: Not on file    Active member of club or organization: Not on file    Attends meetings of clubs or organizations: Not on file    Relationship status: Not on file  . Intimate partner violence    Fear of current or ex partner: Not on file    Emotionally abused: Not on file    Physically abused: Not on file    Forced sexual activity: Not on file  Other Topics Concern  . Not on file  Social History Narrative  . Not on file    Physical Exam      Future Appointments  Date Time Provider [place]  08/12/2019 10:00 AM [facility] PA/NP [facility] None  08/14/2019  9:30 AM Charlott Rakes, MD [facility] None  08/19/2019 12:00 PM [name], MD [facility] None   09/30/2019  1:30 PM [name], NP [facility] None    BP (!) 180/90   Pulse 97   Temp 98 F (36.7 C)   Resp 20   Wt (!) 408 lb (185.1 kg)   SpO2 97%   BMI 56.90 kg/m   Weight yesterday-410 Last visit weight-428 @ clinic with a lot of extra fluid onboard  Pt recent d/c from hosp last Friday. My partner came out Friday to adjust a couple med changes in pill box.  He got another letter of denial for [employer] apt due to criminal background check.  It does say he can dispute this in writing up to 14 days after getting letter so I will see if jenna can assist with this.  Pt states he is doing ok.  Per [facility] notes yesterday home health would not accept him--but he is using his compression stockings. He ripped them up last night trying to put them on.  He did sch his txp services for his upcoming appts.  He needs his inhaler refilled and potassium and torsemide.  He is out of torsemide in sun/mon afternoon doses and sun/mon/tues/fri/sat AM doses.   [name] will deliver it out to him today and I will come back and place it in pill box.   [name], [facility] Paramedic  08/06/19

## 2019-08-07 ENCOUNTER — Other Ambulatory Visit (HOSPITAL_COMMUNITY): Payer: Self-pay

## 2019-08-07 NOTE — Progress Notes (Signed)
[name] couldn't reach pt yesterday to deliver the meds, so I went by to pick them up tfrom pharmacy and brought them out and torsemide placed when needed.   I spoke to the cook of the facility at length about what he should and should no tbe eating. He was getting very discouraged and felt like he cant eat anything. I explained to him portion control and cutting back. The thing with him is, he cant stop at just one. For example he asked about reese cup and he would sit there and eat a king size in one sitting. He snacks a lot per the cook and other employees there and drinks more sodas then what hes told me. So we have figured out why he continues to have fluid overload and has ended up in the hosp or clinic for IV lasix.  I did give him more options and spoke to cook about better options he could get for snacking. He didn't like hearing this but I also tried to be encouraging to him that he can do it.   [name], EMT-Paramedic  08/07/19

## 2019-08-08 ENCOUNTER — Telehealth (HOSPITAL_COMMUNITY): Payer: Self-pay | Admitting: Licensed Clinical Social Worker

## 2019-08-08 NOTE — Telephone Encounter (Signed)
Pt has been calling local boarding house options and was able to find one willing to consider taking someone with his criminal background- landlord requested CSW call to verify level of support pt has.  CSW called and spoke with Benjamin [name] with the [employer].  Explained Benjamin [name] current situation and the high level of involvement from myself, his parole officer, and our [employer] and our strong belief that Benjamin [name] is not at risk of returning to crime.  Benjamin [name] is agreeable to pt moving into one of his boarding houses and is confident that can happen prior to his move out date with [facility]- will plan to have him move into an upstairs apt then move him to a first level room when one becomes available.  CSW faxed requested documents to Benjamin [name] for review- [phone].  He will plan to get back to [place] ASAP regarding move in details.  CSW will continue to follow and assist as needed  Benjamin [name], [facility] Desk#: [phone] Cell#: [phone]

## 2019-08-09 ENCOUNTER — Telehealth (HOSPITAL_COMMUNITY): Payer: Self-pay | Admitting: Licensed Clinical Social Worker

## 2019-08-09 ENCOUNTER — Telehealth (HOSPITAL_COMMUNITY): Payer: Self-pay | Admitting: *Deleted

## 2019-08-09 NOTE — Telephone Encounter (Signed)
  Last week - Patient has been admitted for volume overload due to poor diet and high sodium diet with weight up 20lbs and requiring diuresis. Admission 11/10-11/13 directly from clinic follow-up. Previous Week:  - Walk to help with managing his frustrations either to prevent frustration or to cope - Walk outside daily for 15 minutes (weather permitting and when he is allowed) to pick back up where he was before he had the bleeding - Report back next week about how much he did this and his affect on his frustration  - Add: One longer exercise duration outside (30 minutes) - Add: One extra trip upstairs   Progress Note: - Patient discharged last Friday 11/13 - He has been working on finding living arrangements this week and has been exerting by ambulating back and forth - He has been conversing with his cook about cleaning up his diet and considering sodium content.  - He has been provided more food options and has plans to go to [employer] today for low-sodium foods and fruits and vegetables to snack  - He has been noticing shortness and breath with ambulation, but with improvement from last week when fluid overloaded.  - He did not give any intentional time this week for exercise as he was running around applying for housing.   Achievements: - He is feeling more motivated now that he has good chance of getting a place to live - He has been uplifted with making way to finding a place to live and he isn't as stressed and therefore he is being more mindful in his diet choices. He is excited as he has something to look forward to. - Expressed pride in going out independently to find a place to live - He has much relief in his legs since last week   - He feels proud he has not eaten any [employer] or fast food    Challenges/Frustrations: - He wants more variety in his food, entertainment and more independence.  - He wants to start the exercise program asap  - He wants to lose  weight  - Bored in the facility with little to do, which leads him to want to sit and eat.     New Weekly Goal (s): - Walk outdoor ramp 2x (can split this up) for 3 days in the next week (Saturday, Monday, Tuesday)  - Buy list of healthy snack items from grocery and report back next week     Next Session: Wednesday 11/25 via telephone      Landis Martins, MS, ACSM-RCEP Clinical Exercise Physiologist

## 2019-08-09 NOTE — Telephone Encounter (Signed)
Pt informed CSW that he is being enrolled in [facility] [place] which is a Medicaid program for high risk patients.  Patient was enrolled due to high level of admissions- they can follow and help provide support and services for pt.  Assigned RN is [phone].  CSW spoke with Tyra and explained our level of involvement so she can assist with any gaps in what we are able to assist with.  CSW will continue to follow and assist as needed.  [name], LCSW Clinical Social Worker Advanced Heart Failure Clinic Desk#: [phone] Cell#: [phone]

## 2019-08-12 ENCOUNTER — Ambulatory Visit (HOSPITAL_COMMUNITY)
Admit: 2019-08-12 | Discharge: 2019-08-12 | Disposition: A | Discharge status: Home / Self Care | Payer: Medicaid Other | Attending: Cardiology | Admitting: Cardiology

## 2019-08-12 ENCOUNTER — Encounter (HOSPITAL_COMMUNITY): Payer: Self-pay

## 2019-08-12 ENCOUNTER — Telehealth: Payer: Self-pay

## 2019-08-12 ENCOUNTER — Other Ambulatory Visit: Payer: Self-pay

## 2019-08-12 ENCOUNTER — Other Ambulatory Visit (HOSPITAL_COMMUNITY): Payer: Self-pay

## 2019-08-12 VITALS — BP 179/89 | HR 80 | Wt >= 6400 oz

## 2019-08-12 DIAGNOSIS — Z79899 Other long term (current) drug therapy: Secondary | ICD-10-CM | POA: Insufficient documentation

## 2019-08-12 DIAGNOSIS — I110 Hypertensive heart disease with heart failure: Secondary | ICD-10-CM | POA: Diagnosis not present

## 2019-08-12 DIAGNOSIS — Z7984 Long term (current) use of oral hypoglycemic drugs: Secondary | ICD-10-CM | POA: Insufficient documentation

## 2019-08-12 DIAGNOSIS — G4733 Obstructive sleep apnea (adult) (pediatric): Secondary | ICD-10-CM | POA: Diagnosis not present

## 2019-08-12 DIAGNOSIS — E119 Type 2 diabetes mellitus without complications: Secondary | ICD-10-CM | POA: Insufficient documentation

## 2019-08-12 DIAGNOSIS — J9610 Chronic respiratory failure, unspecified whether with hypoxia or hypercapnia: Secondary | ICD-10-CM | POA: Insufficient documentation

## 2019-08-12 DIAGNOSIS — Z7689 Persons encountering health services in other specified circumstances: Secondary | ICD-10-CM | POA: Diagnosis not present

## 2019-08-12 DIAGNOSIS — Z86711 Personal history of pulmonary embolism: Secondary | ICD-10-CM | POA: Insufficient documentation

## 2019-08-12 DIAGNOSIS — I480 Paroxysmal atrial fibrillation: Secondary | ICD-10-CM | POA: Insufficient documentation

## 2019-08-12 DIAGNOSIS — I5033 Acute on chronic diastolic (congestive) heart failure: Secondary | ICD-10-CM | POA: Diagnosis not present

## 2019-08-12 DIAGNOSIS — Z87891 Personal history of nicotine dependence: Secondary | ICD-10-CM | POA: Diagnosis not present

## 2019-08-12 DIAGNOSIS — I10 Essential (primary) hypertension: Secondary | ICD-10-CM

## 2019-08-12 DIAGNOSIS — Z7901 Long term (current) use of anticoagulants: Secondary | ICD-10-CM | POA: Insufficient documentation

## 2019-08-12 DIAGNOSIS — Z6841 Body Mass Index (BMI) 40.0 and over, adult: Secondary | ICD-10-CM | POA: Diagnosis not present

## 2019-08-12 LAB — BASIC METABOLIC PANEL
Anion gap: 6 (ref 5–15)
BUN: 7 mg/dL (ref 6–20)
CO2: 29 mmol/L (ref 22–32)
Calcium: 8.8 mg/dL — ABNORMAL LOW (ref 8.9–10.3)
Chloride: 103 mmol/L (ref 98–111)
Creatinine, Ser: 1.09 mg/dL (ref 0.61–1.24)
GFR calc Af Amer: >60 mL/min (ref >60)
GFR calc non Af Amer: >60 mL/min (ref >60)
Glucose, Bld: 112 mg/dL — ABNORMAL HIGH (ref 70–99)
Potassium: 4 mmol/L (ref 3.5–5.1)
Sodium: 138 mmol/L (ref 135–145)

## 2019-08-12 MED ORDER — SPIRONOLACTONE 25 MG PO TABS: 25.0000 mg | ORAL_TABLET | Freq: Every day | ORAL | 3 refills | Status: DC

## 2019-08-12 MED ORDER — ALBUTEROL SULFATE HFA 108 (90 BASE) MCG/ACT IN AERS: 2.0000 | INHALATION_SPRAY | Freq: Four times a day (QID) | RESPIRATORY_TRACT | 1 refills | Status: DC | PRN

## 2019-08-12 MED ORDER — METOLAZONE 2.5 MG PO TABS: ORAL_TABLET | ORAL | 3 refills | Status: DC

## 2019-08-12 NOTE — Progress Notes (Signed)
Paramedicine Encounter   Patient ID: Benjamin [name] , male,   DOB: 1965-12-12,53 y.o.,  MRN: [id number]   Met patient in clinic today with provider.   Weight @ clinic-426 B/p-178/90 p-80 sp02-99  Pt is not doing well today. He is sob and his weight is up again. He reports that he has not done anything on the wknd that would have caused his weight to go up.  Metolazone is being added today 2.63m with additional 421m of potassium once a week.   Will go by pharmacy to pick it up.  Increase spiro to full tablet.   Pt planning to move next week and the address is [address] I will go by pharmacy and pick up his meds and take to him and he will take the metolazone today.  I will recheck on him tomorrow.   KaMarylouise StacksEMChildersburg1/23/2020

## 2019-08-12 NOTE — Progress Notes (Signed)
Advanced Heart Failure Clinic Note   Referring Physician: PCP: Benjamin Pounds, NP PCP-Cardiologist: Benjamin Carnes, MD  [facility]: Dr. Aundra [name]   Reason for Visit: [facility] F/u for Diastolic Heart Failure  HPI:  [name] a 53 yo with hx of diastolic CHF, HTN, DM, Atrial fib, PE in April 2020 (on Xarelto), OSA, GERD,chronic elevation of troponin.  Admitted 01/21/2019 with increased dyspnea and had PE. He was placed on back on coumadin.  Patient re-admitted 5/20 for syncope/cough/dyspnea, COVID+, CT positive for bilateral GGOs, admitted to [facility] and discharged after 6 days, on O2. Rec'd Cards f/u.  Patient re-admitted 02/20/19 for bright red blood per rectum, eval'ed by GI, had EGD/colon, dx'd with hemorrhoidal bleeding, discharged after 9 days on O2.He was again Covid + .  Readmitted 03/11/19 with increased shortness of breath. He did not have home oxygen. He was discharged to home the next day.  He presented to [facility] ED7/23/20with lower extremity edema. COVID negative. Diuresed with lasix drip and transitioned to torsemide 40 mg twice daily. Had RHC as noted below with mild volume overload and preserved cardiac output. Discharge weight was 407.9 [name].   He was readmitted with symptomatic anemia 9/8 with hgb 7.1. Received 1UPRBCs. GI consulted. He started on anusol and continued on stool softner.  He had f/u visit 10/20 and was complaining of increased leg edema and weight gain. SOB with exertion. DeniedPND. + Orthopnea. Nobleeding issues.Was eating high sodium food -->Bo jangles chicken. No fever or chills. Taking all medications. Followed closely by HF Paramedicine.He was given a dose of IV Lasix in clinic, 80 mg w/ >1300 urine output after IV lasix.His home torsemide dose was increased to 100 bid and he was instructed to f/u in 2 weeks.   Unfortunately, he was readmitted to the hospital on 10/27 when he presented to the ED w/ worsening symptoms and was admitted by  IM for a/c dCHF and treated w/ IV Lasix. Had 2 day hospital stay and was discharged on 10/29. Echo was done 10/28 and showed normal LVEF 65-70%, RV interpreted as normal but suspect significant RV dysfunction.  After diuresis w/ IV Lasix, he was placed back on home diuretic regimen, torsemide 100 mg bid. Lisinopril 5 mg was also added to regimen. AHF team was not consulted that admit.  He was seen back in clinic 07/30/19. Felt poorly. SOB at rest and worse w/ exertion. Also w/ orthopnea/PND. Significant wt gain of 20 lb up to 428 lb (dry wt ~407 lb), w/ marked abdominal distention and LEE and poor response to IV diuretics. He was directly admitted from clinic to [facility] for IV diuretics.   He was placed on IV Lasix 80 mg bid w/ great response. PICC line was placed to measure CVP. Net diuresis over 15 L. Diuresed down from admit wt of 427 lb to dry wt of 408 lb at discharge. CVP down to 8-9. He was transitioned back to PO torsemide 100 mg bid. He had slight bump in SCr to 1.2 and lisinopril was discontinued. Labetalol increased to 400 mg bid. Spironolactone was also added.   He presents to clinic for f/u. Here w/ paramedicine who has been following him closely. His weight is up from 408>>426 lb. Reports full med compliance and claims he has been doing better in regards to eating less sodium/ processed foods. Currently living in a half- way house and there is a cook, who is aware of his dietary needs (low sodium), per paramedic, Benjamin [name], who had a conversation w/  staff regarding this. He claims he has not ordered any more food from [place]. He has chronic exertional dyspnea but no resting dyspnea and no increased supp O2 demands. He is on chronic supp O2 24/7. BP also elevated 179/89. Reports diuresis has been sluggish, despite compliance w/ Torsemide 100 bid.     Review of Systems: [y] = yes, '[ ]'$  = no   General: Weight gain '[ ]'$ ; Weight loss '[ ]'$ ; Anorexia '[ ]'$ ; Fatigue '[ ]'$ ; Fever '[ ]'$ ; Chills '[ ]'$ ; Weakness '[ ]'$    Cardiac: Chest pain/pressure '[ ]'$ ; Resting SOB '[ ]'$ ; Exertional SOB '[ ]'$ ; Orthopnea '[ ]'$ ; Pedal Edema '[ ]'$ ; Palpitations '[ ]'$ ; Syncope '[ ]'$ ; Presyncope '[ ]'$ ; Paroxysmal nocturnal dyspnea'[ ]'$   Pulmonary: Cough '[ ]'$ ; Wheezing'[ ]'$ ; Hemoptysis'[ ]'$ ; Sputum '[ ]'$ ; Snoring '[ ]'$   GI: Vomiting'[ ]'$ ; Dysphagia'[ ]'$ ; Melena'[ ]'$ ; Hematochezia '[ ]'$ ; Heartburn'[ ]'$ ; Abdominal pain '[ ]'$ ; Constipation '[ ]'$ ; Diarrhea '[ ]'$ ; BRBPR '[ ]'$   GU: Hematuria'[ ]'$ ; Dysuria '[ ]'$ ; Nocturia'[ ]'$   Vascular: Pain in legs with walking '[ ]'$ ; Pain in feet with lying flat '[ ]'$ ; Non-healing sores '[ ]'$ ; Stroke '[ ]'$ ; TIA '[ ]'$ ; Slurred speech '[ ]'$ ;  Neuro: Headaches'[ ]'$ ; Vertigo'[ ]'$ ; Seizures'[ ]'$ ; Paresthesias'[ ]'$ ;Blurred vision '[ ]'$ ; Diplopia '[ ]'$ ; Vision changes '[ ]'$   Ortho/Skin: Arthritis '[ ]'$ ; Joint pain '[ ]'$ ; Muscle pain '[ ]'$ ; Joint swelling '[ ]'$ ; Back Pain '[ ]'$ ; Rash '[ ]'$   Psych: Depression'[ ]'$ ; Anxiety'[ ]'$   Heme: Bleeding problems '[ ]'$ ; Clotting disorders '[ ]'$ ; Anemia '[ ]'$   Endocrine: Diabetes '[ ]'$ ; Thyroid dysfunction'[ ]'$    Past Medical History:  Diagnosis Date  . A-fib ([place])   . Anemia 05/29/2019  . CHF (congestive heart failure) ([place])   . Chronic respiratory failure ([place])   . Diabetes mellitus without complication ([place])   . Dyspnea   . Elevated troponin 02/06/2019  . Hypertension   . Obesity   . Pulmonary embolism ([place])   . Rectal bleeding 05/29/2019  . Sleep apnea     Current Outpatient Medications  Medication Sig Dispense Refill  . albuterol (VENTOLIN HFA) 108 (90 Base) MCG/ACT inhaler Inhale 2 puffs into the lungs every 6 (six) hours as needed for wheezing or shortness of breath. 18 g 0  . amLODipine (NORVASC) 10 MG tablet Take 1 tablet (10 mg total) by mouth daily. 90 tablet 1  . atorvastatin (LIPITOR) 20 MG tablet Take 1 tablet (20 mg total) by mouth at bedtime. 90 tablet 2  . Blood Glucose Monitoring Suppl (TRUE METRIX METER) w/Device KIT Use as instructed. Check blood glucose level by fingerstick twice per day. 1 kit 0  . carboxymethylcellulose (REFRESH PLUS) 0.5  % SOLN Place 1 drop into both eyes daily.     Marland Kitchen docusate sodium (COLACE) 100 MG capsule Take 1 capsule (100 mg total) by mouth 2 (two) times daily. 180 capsule 1  . ferrous sulfate 325 (65 FE) MG tablet Take 1 tablet (325 mg total) by mouth daily. Patient will pick up scripts today. 90 tablet 0  . glucose blood (TRUE METRIX BLOOD GLUCOSE TEST) test strip Use as instructed. Check blood glucose level by fingerstick twice per day. 100 each 12  . labetalol (NORMODYNE) 200 MG tablet Take 2 tablets (400 mg total) by mouth 2 (two) times daily. 120 tablet 3  . levalbuterol (XOPENEX HFA) 45 MCG/ACT inhaler Inhale 2 puffs into the lungs every 4 (four) hours as needed for wheezing.     Marland Kitchen  metFORMIN (GLUCOPHAGE) 500 MG tablet Take 1 tablet (500 mg total) by mouth 2 (two) times daily with a meal. Patient will pick up scripts today. 180 tablet 1  . Misc. Devices MISC Please provide Benjamin [name] with insurance approved portable O2 concentrator ICD 10 J96.11 Z99.81 1 each 0  . Misc. Devices MISC Requires O2 @ 3L/min continuously via nasal canula and home fill system,. 1 each 0  . omeprazole (PRILOSEC) 20 MG capsule Take 1 capsule (20 mg total) by mouth daily. 30 capsule 3  . potassium chloride SA (K-DUR) 20 MEQ tablet Take 2 tablets (40 mEq total) by mouth 2 (two) times daily. 360 tablet 3  . rivaroxaban (XARELTO) 20 MG TABS tablet Take 1 tablet (20 mg total) by mouth daily with supper. 60 tablet 6  . spironolactone (ALDACTONE) 25 MG tablet Take 0.5 tablets (12.5 mg total) by mouth daily. 30 tablet 3  . torsemide (DEMADEX) 100 MG tablet Take 1 tablet (100 mg total) by mouth 2 (two) times daily. 180 tablet 3  . TRUEplus Lancets 28G MISC Use as instructed. Check blood glucose level by fingerstick twice per day. 200 each 3   No current facility-administered medications for this encounter.     No Known Allergies    Social History   Socioeconomic History  . Marital status: Single    Spouse name: Not on file  .  Number of children: Not on file  . Years of education: Not on file  . Highest education level: Not on file  Occupational History  . Not on file  Social Needs  . Financial resource strain: Not on file  . Food insecurity    Worry: Not on file    Inability: Not on file  . Transportation needs    Medical: Not on file    Non-medical: Not on file  Tobacco Use  . Smoking status: Former Research scientist (life sciences)  . Smokeless tobacco: Never Used  Substance and Sexual Activity  . Alcohol use: Not Currently  . Drug use: Not Currently  . Sexual activity: Not Currently  Lifestyle  . Physical activity    Days per week: Not on file    Minutes per session: Not on file  . Stress: Not on file  Relationships  . Social Herbalist on phone: Not on file    Gets together: Not on file    Attends religious service: Not on file    Active member of club or organization: Not on file    Attends meetings of clubs or organizations: Not on file    Relationship status: Not on file  . Intimate partner violence    Fear of current or ex partner: Not on file    Emotionally abused: Not on file    Physically abused: Not on file    Forced sexual activity: Not on file  Other Topics Concern  . Not on file  Social History Narrative  . Not on file      Family History  Problem Relation Age of Onset  . Hypertension Mother   . Diabetes Mother     Vitals:   08/12/19 0949  BP: (!) 179/89  Pulse: 80  SpO2: 99%  Weight: (!) 193.5 kg (426 lb 9.6 oz)     PHYSICAL EXAM: General:  Super morbidly obese AAM. No respiratory difficulty. No supp O2 via [place] HEENT: normal Neck: supple. elevated JVD. Carotids 2+ bilat; no bruits. No lymphadenopathy or thyromegaly appreciated. Cor: PMI nondisplaced. Regular rate &  rhythm. No rubs, gallops or murmurs. Lungs: clear Abdomen: obese, soft, nontender, nondistended. No hepatosplenomegaly. No bruits or masses. Good bowel sounds. Extremities: no cyanosis, clubbing, rash, 1+  bilateral LEE edema Neuro: alert & oriented x 3, cranial nerves grossly intact. moves all 4 extremities w/o difficulty. Affect pleasant.  ECG: not performed   ASSESSMENT & PLAN:  1. Acute on chronic diastolic CHF: Echo in 32/54 with EF 65-70%, RV interpreted as normal but suspect significant RV dysfunction. Recent admit for a/c dCHF in the setting of dietary indiscretion w/ sodium, diuresed down to 408 lb w/ IV Lasix. Wt now trending back up at 426 lb today. Reports diuresis w/ torsemide 100 mg bid is sluggish. BP elevated. Trying to limit salt intake.  - Plan to increase Spironolactone to 25 mg daily for BP and volume   - Check BMP today - Add Metolazone 2.5 mg x1 w/ next dose of torsemide. - Continue Torsemide 100 mg bid. - Repeat BMP in 7 days.  - Discussion again about sodium restriction in diet.  - Continue w/ Paramedicine  2. Atrial fibrillation: Paroxysmal. RRR on exam.   - Continue Xarelto.  3. H/o PE: 5/20, continue Xarelto.  4. OHS/OSA: Continue CPAP at night and oxygen during the day.  Return visit in 7-10 days to reassess volume status.    Lyda Jester, PA-C 08/12/19

## 2019-08-12 NOTE — Telephone Encounter (Signed)
I have sent a prescription for Albuterol to his Pharmacy; he does not need to be on both Albuterol and Xopenex. It is either one or the other.

## 2019-08-12 NOTE — Telephone Encounter (Signed)
Message received from [name], EMT noting that patient needs refills of ventolin inhaler and xopenex if he is to continue with them.  Orders can be sent to [name].

## 2019-08-12 NOTE — Patient Instructions (Signed)
INCREASE Spironolactone 25mg  daily.  Take 2.5mg  of Metolazone today with extra 75meq of Potassium.  Follow up in 7-10 days.

## 2019-08-13 ENCOUNTER — Emergency Department (HOSPITAL_COMMUNITY): Payer: Medicaid Other

## 2019-08-13 ENCOUNTER — Telehealth (HOSPITAL_COMMUNITY): Payer: Self-pay | Admitting: Licensed Clinical Social Worker

## 2019-08-13 ENCOUNTER — Emergency Department (HOSPITAL_COMMUNITY)
Admission: EM | Admit: 2019-08-13 | Discharge: 2019-08-13 | Disposition: A | Discharge status: Home / Self Care | Payer: Medicaid Other | Attending: Internal Medicine | Admitting: Internal Medicine

## 2019-08-13 ENCOUNTER — Other Ambulatory Visit: Payer: Self-pay

## 2019-08-13 ENCOUNTER — Other Ambulatory Visit (HOSPITAL_COMMUNITY): Payer: Self-pay

## 2019-08-13 ENCOUNTER — Encounter: Payer: Self-pay | Admitting: *Deleted

## 2019-08-13 DIAGNOSIS — R600 Localized edema: Secondary | ICD-10-CM | POA: Insufficient documentation

## 2019-08-13 DIAGNOSIS — E119 Type 2 diabetes mellitus without complications: Secondary | ICD-10-CM | POA: Diagnosis not present

## 2019-08-13 DIAGNOSIS — Z8709 Personal history of other diseases of the respiratory system: Secondary | ICD-10-CM | POA: Diagnosis not present

## 2019-08-13 DIAGNOSIS — Z7901 Long term (current) use of anticoagulants: Secondary | ICD-10-CM | POA: Diagnosis not present

## 2019-08-13 DIAGNOSIS — I4891 Unspecified atrial fibrillation: Secondary | ICD-10-CM | POA: Insufficient documentation

## 2019-08-13 DIAGNOSIS — I10 Essential (primary) hypertension: Secondary | ICD-10-CM | POA: Diagnosis not present

## 2019-08-13 DIAGNOSIS — J80 Acute respiratory distress syndrome: Secondary | ICD-10-CM | POA: Diagnosis not present

## 2019-08-13 DIAGNOSIS — I509 Heart failure, unspecified: Secondary | ICD-10-CM

## 2019-08-13 DIAGNOSIS — I110 Hypertensive heart disease with heart failure: Secondary | ICD-10-CM | POA: Diagnosis not present

## 2019-08-13 DIAGNOSIS — Z86711 Personal history of pulmonary embolism: Secondary | ICD-10-CM | POA: Insufficient documentation

## 2019-08-13 DIAGNOSIS — Z79899 Other long term (current) drug therapy: Secondary | ICD-10-CM | POA: Insufficient documentation

## 2019-08-13 DIAGNOSIS — R0602 Shortness of breath: Secondary | ICD-10-CM | POA: Diagnosis not present

## 2019-08-13 DIAGNOSIS — Z87891 Personal history of nicotine dependence: Secondary | ICD-10-CM | POA: Insufficient documentation

## 2019-08-13 DIAGNOSIS — R918 Other nonspecific abnormal finding of lung field: Secondary | ICD-10-CM | POA: Diagnosis not present

## 2019-08-13 DIAGNOSIS — I5032 Chronic diastolic (congestive) heart failure: Secondary | ICD-10-CM | POA: Diagnosis not present

## 2019-08-13 DIAGNOSIS — Z006 Encounter for examination for normal comparison and control in clinical research program: Secondary | ICD-10-CM

## 2019-08-13 DIAGNOSIS — Z20828 Contact with and (suspected) exposure to other viral communicable diseases: Secondary | ICD-10-CM | POA: Diagnosis not present

## 2019-08-13 DIAGNOSIS — R4702 Dysphasia: Secondary | ICD-10-CM | POA: Diagnosis not present

## 2019-08-13 DIAGNOSIS — R0689 Other abnormalities of breathing: Secondary | ICD-10-CM | POA: Diagnosis not present

## 2019-08-13 LAB — CBC WITH DIFFERENTIAL/PLATELET
Abs Immature Granulocytes: 0.03 10*3/uL (ref 0.00–0.07)
Basophils Absolute: 0 10*3/uL (ref 0.0–0.1)
Basophils Relative: 0 %
Eosinophils Absolute: 0.6 10*3/uL — ABNORMAL HIGH (ref 0.0–0.5)
Eosinophils Relative: 7 %
HCT: 33.7 % — ABNORMAL LOW (ref 39.0–52.0)
Hemoglobin: 9.5 g/dL — ABNORMAL LOW (ref 13.0–17.0)
Immature Granulocytes: 0 %
Lymphocytes Relative: 29 %
Lymphs Abs: 2.3 10*3/uL (ref 0.7–4.0)
MCH: 21.1 pg — ABNORMAL LOW (ref 26.0–34.0)
MCHC: 28.2 g/dL — ABNORMAL LOW (ref 30.0–36.0)
MCV: 74.7 fL — ABNORMAL LOW (ref 80.0–100.0)
Monocytes Absolute: 0.7 10*3/uL (ref 0.1–1.0)
Monocytes Relative: 10 %
Neutro Abs: 4.2 10*3/uL (ref 1.7–7.7)
Neutrophils Relative %: 54 %
Platelets: 364 10*3/uL (ref 150–400)
RBC: 4.51 MIL/uL (ref 4.22–5.81)
RDW: 19.5 % — ABNORMAL HIGH (ref 11.5–15.5)
WBC: 7.8 10*3/uL (ref 4.0–10.5)
nRBC: 0 % (ref 0.0–0.2)

## 2019-08-13 LAB — TROPONIN I (HIGH SENSITIVITY)
Troponin I (High Sensitivity): 16 ng/L (ref <18)
Troponin I (High Sensitivity): 17 ng/L (ref <18)

## 2019-08-13 LAB — BASIC METABOLIC PANEL
Anion gap: 9 (ref 5–15)
BUN: 12 mg/dL (ref 6–20)
CO2: 27 mmol/L (ref 22–32)
Calcium: 8.9 mg/dL (ref 8.9–10.3)
Chloride: 103 mmol/L (ref 98–111)
Creatinine, Ser: 1.09 mg/dL (ref 0.61–1.24)
GFR calc Af Amer: >60 mL/min (ref >60)
GFR calc non Af Amer: >60 mL/min (ref >60)
Glucose, Bld: 124 mg/dL — ABNORMAL HIGH (ref 70–99)
Potassium: 3.6 mmol/L (ref 3.5–5.1)
Sodium: 139 mmol/L (ref 135–145)

## 2019-08-13 LAB — BRAIN NATRIURETIC PEPTIDE: B Natriuretic Peptide: 31.7 pg/mL (ref 0.0–100.0)

## 2019-08-13 LAB — POC SARS CORONAVIRUS 2 AG -  ED: SARS Coronavirus 2 Ag: NEGATIVE

## 2019-08-13 MED ORDER — FUROSEMIDE 10 MG/ML IJ SOLN
40.0000 mg | INTRAMUSCULAR | Status: AC
  Administered 2019-08-13: 40 mg via INTRAVENOUS
  Filled 2019-08-13: qty 4

## 2019-08-13 MED ORDER — POTASSIUM CHLORIDE CRYS ER 20 MEQ PO TBCR
40.0000 meq | EXTENDED_RELEASE_TABLET | Freq: Once | ORAL | Status: DC
  Filled 2019-08-13: qty 2

## 2019-08-13 MED ORDER — FUROSEMIDE 10 MG/ML IJ SOLN: 40.0000 mg | INTRAMUSCULAR | Status: DC

## 2019-08-13 NOTE — Plan of Care (Signed)
Mr. Tinnel was a carryover patient from the night service home was supposed to be entered for CHF exacerbation.  However, after evaluation of his medical chart and labs it appears that he may be a candidate for the [facility] heart failure trial.  Heart failure team was notified and we will assess the patient to see if he qualifies.  Dr. Rex Kras updated in regards to this change in the possibility of patient being discharged home from the emergency department.

## 2019-08-13 NOTE — ED Triage Notes (Signed)
Pt arrives via [facility] complaining of SOB since midnight tonight. Pt reports he has gain around 18 lbs over the last 4 days, but states he has been compliant  with his Lasix. Pt arrived on 4L [place] O2, which he states he chronically wears.

## 2019-08-13 NOTE — ED Provider Notes (Signed)
Patient was initially being admitted to the [facility] hospitalist service.  Dr. Tamala Julian rounded on the patient and then noted that he would qualify for the [facility] heart failure trial. Pt evaluated by research team and consented to participate in outpatient trial. Research nurse educated and provided w/ medication. Pt will be seen in cardiology clinic in 3 days and understands to hold torsemide at home until then. On my exam, he is asleep and comfortable, maintaining good O2 sat on home 4L O2. No respiratory distress.  He voiced understanding of home instructions and return precautions.  Patient discharged in satisfactory condition.   Little, Wenda Overland, MD 08/13/19 1251

## 2019-08-13 NOTE — Discharge Instructions (Addendum)
A nurse will contact you within 24 hours. You have a follow up in the cardiology clinic on Friday 11/27 with Dr. Aundra Dubin at 8:00 am.  Take all your usual daily medications as prescribed EXCEPT TORSEMIDE (DO NOT TAKE THIS MEDICATION UNTIL INSTRUCTED BY DR. Aundra Dubin.) Return to the ER if you have any worsening shortness of breath, near passing out, or chest pain.

## 2019-08-13 NOTE — ED Provider Notes (Signed)
[facility] EMERGENCY DEPARTMENT Provider Note   CSN: [id number] Arrival date & time: 08/13/19  0214     History   Chief Complaint Chief Complaint  Patient presents with  . Shortness of Breath    HPI Benjamin [name] is a 53 y.o. male.     Patient with past medical history notable for A. fib, CHF, prior PE on Xarelto, seen in heart failure clinic, presents to the emergency department with a chief complaint of shortness of breath.  He states that he feels like he is fluid overloaded.  States that he has had a 20 pound weight gain.  He has been taking his medications as directed.  He reports having to increase his oxygen from 3 L to 4 L.  He also reports that when he lies down, he cannot breathe, and has to sleep sitting up.  He denies any fever, chills, or cough.  The history is provided by the patient. No language interpreter was used.    Past Medical History:  Diagnosis Date  . A-fib ([place])   . Anemia 05/29/2019  . CHF (congestive heart failure) ([place])   . Chronic respiratory failure ([place])   . Diabetes mellitus without complication ([place])   . Dyspnea   . Elevated troponin 02/06/2019  . Hypertension   . Obesity   . Pulmonary embolism ([place])   . Rectal bleeding 05/29/2019  . Sleep apnea     Patient Active Problem List   Diagnosis Date Noted  . Acute on chronic diastolic CHF (congestive heart failure), NYHA class 3 ([place]) 07/30/2019  . Diabetes mellitus without complication ([place]) 38/06/1750  . Hypertensive emergency   . BRBPR (bright red blood per rectum) 06/12/2019  . Hypertension   . CHF (congestive heart failure) ([place])   . Sleep apnea   . Chronic respiratory failure ([place])   . Lower GI bleeding 06/11/2019  . Hypokalemia 05/29/2019  . GI bleed 05/29/2019  . Anemia 05/28/2019  . Acute on chronic diastolic CHF (congestive heart failure) ([place]) 04/11/2019  . Iron deficiency anemia 03/11/2019  . OSA on CPAP 03/11/2019  . HLD (hyperlipidemia)  03/11/2019  . Elevated troponin 03/11/2019  . GERD (gastroesophageal reflux disease) 03/11/2019  . Rectal bleeding 02/20/2019  . Dyspnea 02/06/2019  . COVID-19 virus infection 02/06/2019  . Bilateral lower extremity edema   . Morbid obesity with BMI of 50.0-59.9, adult ([place])   . Chronic diastolic CHF (congestive heart failure) ([place])   . PAF (paroxysmal atrial fibrillation) ([place])   . PE (pulmonary thromboembolism) ([place]) 01/21/2019  . Hypertensive urgency 01/21/2019  . Diabetes mellitus type 2 in obese ([place]) 01/21/2019    Past Surgical History:  Procedure Laterality Date  . COLONOSCOPY WITH PROPOFOL Left 02/25/2019   Procedure: COLONOSCOPY WITH PROPOFOL;  Surgeon: Arta Silence, MD;  Location: [facility];  Service: Endoscopy;  Laterality: Left;  . ESOPHAGOGASTRODUODENOSCOPY (EGD) WITH PROPOFOL Left 02/25/2019   Procedure: ESOPHAGOGASTRODUODENOSCOPY (EGD) WITH PROPOFOL;  Surgeon: Arta Silence, MD;  [facility] ENDOSCOPY;  Service: Endoscopy;  Laterality: Left;  . ESOPHAGOGASTRODUODENOSCOPY (EGD) WITH PROPOFOL N/A 06/12/2019   Procedure: ESOPHAGOGASTRODUODENOSCOPY (EGD) WITH PROPOFOL;  Surgeon: Clarene Essex, MD;  Location: [facility];  Service: Gastroenterology;  Laterality: N/A;  . FLEXIBLE SIGMOIDOSCOPY N/A 06/12/2019   Procedure: FLEXIBLE SIGMOIDOSCOPY;  Surgeon: Clarene Essex, MD;  Location: [facility];  Service: Gastroenterology;  Laterality: N/A;  . HEMORRHOID BANDING  05/2019  . NO PAST SURGERIES    . RIGHT HEART CATH N/A 04/17/2019   Procedure: RIGHT  HEART CATH;  Surgeon: Larey Dresser, MD;  Location: [facility] CV LAB;  Service: Cardiovascular;  Laterality: N/A;        Home Medications    Prior to Admission medications   Medication Sig Start Date End Date Taking? Authorizing Provider  rivaroxaban (XARELTO) 20 MG TABS tablet Take 1 tablet (20 mg total) by mouth daily with supper. 08/02/19  Yes Lyda Jester M, PA-C  spironolactone (ALDACTONE) 25 MG tablet Take 1  tablet (25 mg total) by mouth daily. 08/12/19  Yes Simmons, Brittainy M, PA-C  albuterol (VENTOLIN HFA) 108 (90 Base) MCG/ACT inhaler Inhale 2 puffs into the lungs every 6 (six) hours as needed for wheezing or shortness of breath. 08/12/19   Charlott Rakes, MD  amLODipine (NORVASC) 10 MG tablet Take 1 tablet (10 mg total) by mouth daily. 06/04/19 09/02/19  [name], NP  atorvastatin (LIPITOR) 20 MG tablet Take 1 tablet (20 mg total) by mouth at bedtime. 06/04/19 09/02/19  [name], NP  Blood Glucose Monitoring Suppl (TRUE METRIX METER) w/Device KIT Use as instructed. Check blood glucose level by fingerstick twice per day. 03/18/19   [name], NP  carboxymethylcellulose (REFRESH PLUS) 0.5 % SOLN Place 1 drop into both eyes daily.     [provider]  docusate sodium (COLACE) 100 MG capsule Take 1 capsule (100 mg total) by mouth 2 (two) times daily. 06/28/19 09/26/19  [name], NP  ferrous sulfate 325 (65 FE) MG tablet Take 1 tablet (325 mg total) by mouth daily. Patient will pick up scripts today. 06/24/19 09/22/19  [name], NP  glucose blood (TRUE METRIX BLOOD GLUCOSE TEST) test strip Use as instructed. Check blood glucose level by fingerstick twice per day. 03/18/19   [name], NP  labetalol (NORMODYNE) 200 MG tablet Take 2 tablets (400 mg total) by mouth 2 (two) times daily. 08/02/19   Lyda Jester M, PA-C  levalbuterol [facility] HFA) 45 MCG/ACT inhaler Inhale 2 puffs into the lungs every 4 (four) hours as needed for wheezing.     [provider]  metFORMIN (GLUCOPHAGE) 500 MG tablet Take 1 tablet (500 mg total) by mouth 2 (two) times daily with a meal. Patient will pick up scripts today. 06/28/19 09/26/19  [name], NP  metolazone (ZAROXOLYN) 2.5 MG tablet Take only as directed by CHF clinic. 08/12/19   Consuelo Pandy, PA-C  Misc. Devices MISC Please provide [name] with insurance approved portable O2 concentrator ICD 10  J96.11 Z99.81 06/30/19   [name], NP  Misc. Devices MISC Requires O2 @ 3L/min continuously via nasal canula and home fill system,. 07/03/19   [name], NP  omeprazole (PRILOSEC) 20 MG capsule Take 1 capsule (20 mg total) by mouth daily. 05/19/19   [name], NP  potassium chloride SA (K-DUR) 20 MEQ tablet Take 2 tablets (40 mEq total) by mouth 2 (two) times daily. 06/05/19 08/12/19  Clegg, Amy D, NP  torsemide (DEMADEX) 100 MG tablet Take 1 tablet (100 mg total) by mouth 2 (two) times daily. 07/09/19   Clegg, Amy D, NP  TRUEplus Lancets 28G MISC Use as instructed. Check blood glucose level by fingerstick twice per day. 03/18/19   [name], NP    Family History Family History  Problem Relation Age of Onset  . Hypertension Mother   . Diabetes Mother     Social History Social History   Tobacco Use  . Smoking status:  Former Smoker  . Smokeless tobacco: Never Used  Substance Use Topics  . Alcohol use: Not Currently  . Drug use: Not Currently     Allergies   Patient has no known allergies.   Review of Systems Review of Systems  All other systems reviewed and are negative.    Physical Exam Updated Vital Signs BP (!) 150/95   Pulse 96   Resp (!) 23   Ht 5' 11"  (1.803 m)   Wt (!) 193.7 kg   SpO2 97%   BMI 59.55 kg/m   Physical Exam Vitals signs and nursing note reviewed.  Constitutional:      Appearance: He is well-developed.  HENT:     Head: Normocephalic and atraumatic.  Eyes:     Conjunctiva/sclera: Conjunctivae normal.  Neck:     Musculoskeletal: Neck supple.  Cardiovascular:     Rate and Rhythm: Normal rate and regular rhythm.     Heart sounds: No murmur.  Pulmonary:     Effort: Pulmonary effort is normal. No respiratory distress.     Breath sounds: Wheezing present.  Abdominal:     Palpations: Abdomen is soft.     Tenderness: There is no abdominal tenderness.  Musculoskeletal:     Right lower leg: Edema present.     Left  lower leg: Edema present.  Skin:    General: Skin is warm and dry.  Neurological:     Mental Status: He is alert and oriented to person, place, and time.  Psychiatric:        Mood and Affect: Mood normal.        Behavior: Behavior normal.      ED Treatments / Results  Labs (all labs ordered are listed, but only abnormal results are displayed) Labs Reviewed  CBC WITH DIFFERENTIAL/PLATELET - Abnormal; Notable for the following components:      Result Value   Hemoglobin 9.5 (*)    HCT 33.7 (*)    MCV 74.7 (*)    MCH 21.1 (*)    MCHC 28.2 (*)    RDW 19.5 (*)    Eosinophils Absolute 0.6 (*)    All other components within normal limits  BASIC METABOLIC PANEL - Abnormal; Notable for the following components:   Glucose, Bld 124 (*)    All other components within normal limits  BRAIN NATRIURETIC PEPTIDE  POC SARS CORONAVIRUS 2 AG -  ED  TROPONIN I (HIGH SENSITIVITY)  TROPONIN I (HIGH SENSITIVITY)    EKG None  Radiology Dg Chest Port 1 View  Result Date: 08/13/2019 CLINICAL DATA:  Shortness of breath since midnight, recent weight gain EXAM: PORTABLE CHEST 1 VIEW COMPARISON:  Radiograph 07/16/2019, CT 02/26/2019 FINDINGS: Hazy interstitial opacities with central venous congestion with cuffing and indistinct vascularity. No pleural effusion. Mild cardiomegaly similar to priors. No pneumothorax. No visible effusion. No focal consolidative process. No acute osseous or soft tissue abnormality. IMPRESSION: 1. Hazy interstitial opacities with central venous congestion and indistinct vascularity, consistent with interstitial edema. No pleural effusion. 2. Mild cardiomegaly similar to priors. Electronically Signed   By: Lovena Le M.D.   On: 08/13/2019 03:15    Procedures Procedures (including critical care time)  Medications Ordered in ED Medications  furosemide (LASIX) injection 40 mg (has no administration in time range)     Initial Impression / Assessment and Plan / ED Course   I have reviewed the triage vital signs and the nursing notes.  Pertinent labs & imaging results that were available during  my care of the patient were reviewed by me and considered in my medical decision making (see chart for details).        Patient here with shortness of breath.  Has history of CHF.  Reports 20 pound weight gain.  He has been taking his medications as prescribed.  He does have some wheezing.  He also has significant edema in his bilateral lower extremities.  States that he cannot breathe when he lies down.  He has had to increase his oxygen.  Chest x-ray shows hazy interstitial opacities with central venous congestion consistent with interstitial edema.  We will start patient on IV Lasix and plan for admission to the hospital.  Covid test pending.  5:18 AM Appreciate Dr. Marlowe Sax for admitting the patient.   Final Clinical Impressions(s) / ED Diagnoses   Final diagnoses:  Congestive heart failure, unspecified HF chronicity, unspecified heart failure type [facility])    ED Discharge Orders    None       Montine Circle, PA-C 08/13/19 [id number]    Merryl Hacker, MD 08/13/19 [phone]

## 2019-08-13 NOTE — Telephone Encounter (Signed)
Pt was admitted to ED due to volume overload- CSW went to visit pt prior to DC and assist with getting cab to get home.  CSW met with pt and confirmed upcoming appts- pt to call medicaid transport to set up ride to appt on Friday and Monday for his clinic appts.  CSW also informed community paramedic of DC- she will visit pt today and make med changes to pt pill box.  CSW and pt called Secondary school teacher for pt room he is moving to next week- he confirms we are on schedule for a Monday move in time and will send lease agreement over to Mazie for pt to sign and get returned to him.  CSW will continue to follow and assist as needed  [name], [facility] Desk#: [phone] Cell#: [phone]

## 2019-08-13 NOTE — ED Notes (Signed)
Pt refusing discharge vitals, sts we are rushing him and is uncooperative. Pt ambulating out of ER with steady gait.

## 2019-08-13 NOTE — Research (Signed)
Subject Name: Benjamin [name]  Subject met inclusion and exclusion criteria.  The informed consent form, study requirements and expectations were reviewed with the subject and questions and concerns were addressed prior to the signing of the consent form.  The subject verbalized understanding of the trial requirements.  The subject agreed to participate in the [facility] HF trial and signed the informed consent at 1100 am on11/24/20.  The informed consent was obtained prior to performance of any protocol-specific procedures for the subject.  A copy of the signed informed consent was given to the subject and a copy was placed in the subject's medical record.   [place], [place]    Patient sent home with 4 Furoscix Infusor. Pt to place first Infusor today at 5:00 pm(08/13/2019). On 08/14/2019 pt is to apply a Furoscix Infusor at 8:00 am and 5:00 pm. On 08/15/2019 pt is to apply a Furoscix Infusor at 8:00am. Pt to stop taking Torsemide until he follows up with Dr Aundra Dubin on 08/16/2019. Pt verbalized understanding.

## 2019-08-13 NOTE — Progress Notes (Signed)
Paramedicine Encounter    Patient ID: Benjamin [name], male    DOB: Jun 07, 1966, 53 y.o.   MRN: [id number]   Patient Care Team: [name], NP as PCP - General (Nurse Practitioner) [name], MD as PCP - Cardiology (Cardiology) [name], LCSW as Social Worker (Licensed Clinical Social Worker)  Patient Active Problem List   Diagnosis Date Noted  . Acute on chronic diastolic CHF (congestive heart failure), NYHA class 3 ([place]) 07/30/2019  . Diabetes mellitus without complication ([place]) 24/23/5361  . Hypertensive emergency   . BRBPR (bright red blood per rectum) 06/12/2019  . Hypertension   . CHF (congestive heart failure) ([place])   . Sleep apnea   . Chronic respiratory failure ([place])   . Lower GI bleeding 06/11/2019  . Hypokalemia 05/29/2019  . GI bleed 05/29/2019  . Anemia 05/28/2019  . Acute on chronic diastolic CHF (congestive heart failure) ([place]) 04/11/2019  . Iron deficiency anemia 03/11/2019  . OSA on CPAP 03/11/2019  . HLD (hyperlipidemia) 03/11/2019  . Elevated troponin 03/11/2019  . GERD (gastroesophageal reflux disease) 03/11/2019  . Rectal bleeding 02/20/2019  . Dyspnea 02/06/2019  . COVID-19 virus infection 02/06/2019  . Bilateral lower extremity edema   . Morbid obesity with BMI of 50.0-59.9, adult ([place])   . Chronic diastolic CHF (congestive heart failure) ([place])   . PAF (paroxysmal atrial fibrillation) ([place])   . PE (pulmonary thromboembolism) ([place]) 01/21/2019  . Hypertensive urgency 01/21/2019  . Diabetes mellitus type 2 in obese ([place]) 01/21/2019    Current Outpatient Medications:  .  albuterol (VENTOLIN HFA) 108 (90 Base) MCG/ACT inhaler, Inhale 2 puffs into the lungs every 6 (six) hours as needed for wheezing or shortness of breath., Disp: 18 g, Rfl: 1 .  amLODipine (NORVASC) 10 MG tablet, Take 1 tablet (10 mg total) by mouth daily., Disp: 90 tablet, Rfl: 1 .  atorvastatin (LIPITOR) 20 MG tablet, Take 1 tablet (20 mg total) by mouth at bedtime.,  Disp: 90 tablet, Rfl: 2 .  Blood Glucose Monitoring Suppl (TRUE METRIX METER) w/Device KIT, Use as instructed. Check blood glucose level by fingerstick twice per day., Disp: 1 kit, Rfl: 0 .  carboxymethylcellulose (REFRESH PLUS) 0.5 % SOLN, Place 1 drop into both eyes daily. , Disp: , Rfl:  .  docusate sodium (COLACE) 100 MG capsule, Take 1 capsule (100 mg total) by mouth 2 (two) times daily., Disp: 180 capsule, Rfl: 1 .  ferrous sulfate 325 (65 FE) MG tablet, Take 1 tablet (325 mg total) by mouth daily. Patient will pick up scripts today., Disp: 90 tablet, Rfl: 0 .  glucose blood (TRUE METRIX BLOOD GLUCOSE TEST) test strip, Use as instructed. Check blood glucose level by fingerstick twice per day., Disp: 100 each, Rfl: 12 .  labetalol (NORMODYNE) 200 MG tablet, Take 2 tablets (400 mg total) by mouth 2 (two) times daily., Disp: 120 tablet, Rfl: 3 .  metFORMIN (GLUCOPHAGE) 500 MG tablet, Take 1 tablet (500 mg total) by mouth 2 (two) times daily with a meal. Patient will pick up scripts today., Disp: 180 tablet, Rfl: 1 .  Misc. Devices MISC, Requires O2 @ 3L/min continuously via nasal canula and home fill system,., Disp: 1 each, Rfl: 0 .  omeprazole (PRILOSEC) 20 MG capsule, Take 1 capsule (20 mg total) by mouth daily., Disp: 30 capsule, Rfl: 3 .  rivaroxaban (XARELTO) 20 MG TABS tablet, Take 1 tablet (20 mg total) by mouth daily with supper., Disp: 60 tablet, Rfl: 6 .  spironolactone (ALDACTONE) 25 MG tablet, Take 1 tablet (25 mg total) by mouth daily., Disp: 30 tablet, Rfl: 3 .  TRUEplus Lancets 28G MISC, Use as instructed. Check blood glucose level by fingerstick twice per day., Disp: 200 each, Rfl: 3 .  levalbuterol (XOPENEX HFA) 45 MCG/ACT inhaler, Inhale 2 puffs into the lungs every 4 (four) hours as needed for wheezing. , Disp: , Rfl:  .  metolazone (ZAROXOLYN) 2.5 MG tablet, Take only as directed by CHF clinic. (Patient not taking: Reported on 08/13/2019), Disp: 10 tablet, Rfl: 3 .  Misc.  Devices MISC, Please provide Benjamin [name] with insurance approved portable O2 concentrator ICD 10 J96.11 [phone] (Patient not taking: Reported on 08/13/2019), Disp: 1 each, Rfl: 0 .  potassium chloride SA (K-DUR) 20 MEQ tablet, Take 2 tablets (40 mEq total) by mouth 2 (two) times daily., Disp: 360 tablet, Rfl: 3 .  torsemide (DEMADEX) 100 MG tablet, Take 1 tablet (100 mg total) by mouth 2 (two) times daily. (Patient not taking: Reported on 08/13/2019), Disp: 180 tablet, Rfl: 3 No Known Allergies    Social History   Socioeconomic History  . Marital status: Single    Spouse name: Not on file  . Number of children: Not on file  . Years of education: Not on file  . Highest education level: Not on file  Occupational History  . Not on file  Social Needs  . Financial resource strain: Not on file  . Food insecurity    Worry: Not on file    Inability: Not on file  . Transportation needs    Medical: Not on file    Non-medical: Not on file  Tobacco Use  . Smoking status: Former Research scientist (life sciences)  . Smokeless tobacco: Never Used  Substance and Sexual Activity  . Alcohol use: Not Currently  . Drug use: Not Currently  . Sexual activity: Not Currently  Lifestyle  . Physical activity    Days per week: Not on file    Minutes per session: Not on file  . Stress: Not on file  Relationships  . Social Herbalist on phone: Not on file    Gets together: Not on file    Attends religious service: Not on file    Active member of club or organization: Not on file    Attends meetings of clubs or organizations: Not on file    Relationship status: Not on file  . Intimate partner violence    Fear of current or ex partner: Not on file    Emotionally abused: Not on file    Physically abused: Not on file    Forced sexual activity: Not on file  Other Topics Concern  . Not on file  Social History Narrative  . Not on file    Physical Exam      Future Appointments  Date Time Provider [place]  08/14/2019  9:30 AM Charlott Rakes, MD [facility] None  08/16/2019  8:00 AM Larey Dresser, MD [facility] None  08/19/2019 11:00 AM [facility] PA/NP [facility] None  08/19/2019 12:00 PM [name], MD [facility] None  09/10/2019 10:00 AM [facility] PA/NP [facility] None  09/30/2019  1:30 PM [name], NP [facility] None     Last visit weight-426 @ clinic   Pt home from the [place]. He came back with a lasix injection pump--we went over that and placed it for tonight. I spoke to erica, the RN that seen pt in ER to give this to  him and he asked about waiting until he gets back from his doc appoint tomor to place the other one and she said that was fine. He is due for one for tomor and tomor evening and then again on thurs and f/u with dr Aundra Dubin on Friday.  He does need to call to sch his ride for Friday.  meds verified. Pill box refilled. No torsemide placed at this time.   [name], [facility] Paramedic  08/13/19

## 2019-08-13 NOTE — Telephone Encounter (Signed)
He was only on xopenex in the hospital

## 2019-08-13 NOTE — ED Notes (Signed)
Patient attempting to call for a ride

## 2019-08-14 ENCOUNTER — Telehealth (HOSPITAL_COMMUNITY): Payer: Self-pay | Admitting: Licensed Clinical Social Worker

## 2019-08-14 ENCOUNTER — Encounter: Payer: Self-pay | Admitting: *Deleted

## 2019-08-14 ENCOUNTER — Telehealth (HOSPITAL_COMMUNITY): Payer: Self-pay | Admitting: *Deleted

## 2019-08-14 ENCOUNTER — Ambulatory Visit: Payer: Medicaid Other | Attending: Family Medicine | Admitting: Family Medicine

## 2019-08-14 ENCOUNTER — Telehealth (HOSPITAL_COMMUNITY): Payer: Self-pay

## 2019-08-14 DIAGNOSIS — Z7689 Persons encountering health services in other specified circumstances: Secondary | ICD-10-CM | POA: Diagnosis not present

## 2019-08-14 DIAGNOSIS — Z006 Encounter for examination for normal comparison and control in clinical research program: Secondary | ICD-10-CM

## 2019-08-14 NOTE — Telephone Encounter (Signed)
CSW called social security with patient to update address to [address] where he will be moving on Monday.  Social security rep confirmed that this should mean that his payments arent stopped but she can't verify till the information is updated in their system- suggested CSW call back on Monday to verify.  Pt signed lease to house and CSW sent in to Secondary school teacher.  Pt also paid first months rent and social security deposit to Secondary school teacher once lease was signed.  CSW will continue to follow and assist as needed  Benjamin [name], [facility] Desk#: [phone] Cell#: [phone]

## 2019-08-14 NOTE — Telephone Encounter (Signed)
Pt called me this morning and said his weight is already down to 406 after that lasix pump yesterday. He did have trouble taking it off causing himself to bleed a lot but hes ok though. He had a lot of urine output with it and was very appreciative for being in this study to have this. He sounded better over the phone, could speak more clearly and less short of breath while speaking. He is going to PCP today and he will call me once he returns so I can assist him with placing the next one. I will also go swap out his tanks today too.   [name], EMT-Paramedic 08/14/19

## 2019-08-14 NOTE — Research (Signed)
Called Benjamin [name] for his day 1 visit. He loves it, he stated he wish someone had developed it a long time ago. He said that he has urinated all night and his weight is 406 lbs this am.   He feels great and sounds good too. He has been running lots of errands today following up with PCP and feeling good.   Patient has no complaints of CP, SOB, SOB while lying down, and no difficulty sleeping. Some swelling in his legs but "they don't look full anymore." He didn't have any issues with the two devices he has used, he did say the first time he took it off last night he jerked it causing some bleeding but nothing bad. The second dose this am when he removed it, it was better. So far he has had two doses, and his next dose is due at 1730 this evening.  2 unused infusor left to use.  Patient will follow up with Dr Aundra Dubin on Friday at 0800.    Current Outpatient Medications:  .  albuterol (VENTOLIN HFA) 108 (90 Base) MCG/ACT inhaler, Inhale 2 puffs into the lungs every 6 (six) hours as needed for wheezing or shortness of breath., Disp: 18 g, Rfl: 1 .  amLODipine (NORVASC) 10 MG tablet, Take 1 tablet (10 mg total) by mouth daily., Disp: 90 tablet, Rfl: 1 .  atorvastatin (LIPITOR) 20 MG tablet, Take 1 tablet (20 mg total) by mouth at bedtime., Disp: 90 tablet, Rfl: 2 .  Blood Glucose Monitoring Suppl (TRUE METRIX METER) w/Device KIT, Use as instructed. Check blood glucose level by fingerstick twice per day., Disp: 1 kit, Rfl: 0 .  carboxymethylcellulose (REFRESH PLUS) 0.5 % SOLN, Place 1 drop into both eyes daily. , Disp: , Rfl:  .  docusate sodium (COLACE) 100 MG capsule, Take 1 capsule (100 mg total) by mouth 2 (two) times daily., Disp: 180 capsule, Rfl: 1 .  ferrous sulfate 325 (65 FE) MG tablet, Take 1 tablet (325 mg total) by mouth daily. Patient will pick up scripts today., Disp: 90 tablet, Rfl: 0 .  glucose blood (TRUE METRIX BLOOD GLUCOSE TEST) test strip, Use as instructed. Check blood glucose level by  fingerstick twice per day., Disp: 100 each, Rfl: 12 .  labetalol (NORMODYNE) 200 MG tablet, Take 2 tablets (400 mg total) by mouth 2 (two) times daily., Disp: 120 tablet, Rfl: 3 .  levalbuterol (XOPENEX HFA) 45 MCG/ACT inhaler, Inhale 2 puffs into the lungs every 4 (four) hours as needed for wheezing. , Disp: , Rfl:  .  metFORMIN (GLUCOPHAGE) 500 MG tablet, Take 1 tablet (500 mg total) by mouth 2 (two) times daily with a meal. Patient will pick up scripts today., Disp: 180 tablet, Rfl: 1 .  Misc. Devices MISC, Requires O2 @ 3L/min continuously via nasal canula and home fill system,., Disp: 1 each, Rfl: 0 .  omeprazole (PRILOSEC) 20 MG capsule, Take 1 capsule (20 mg total) by mouth daily., Disp: 30 capsule, Rfl: 3 .  rivaroxaban (XARELTO) 20 MG TABS tablet, Take 1 tablet (20 mg total) by mouth daily with supper., Disp: 60 tablet, Rfl: 6 .  spironolactone (ALDACTONE) 25 MG tablet, Take 1 tablet (25 mg total) by mouth daily., Disp: 30 tablet, Rfl: 3 .  TRUEplus Lancets 28G MISC, Use as instructed. Check blood glucose level by fingerstick twice per day., Disp: 200 each, Rfl: 3 .  metolazone (ZAROXOLYN) 2.5 MG tablet, Take only as directed by CHF clinic. (Patient not taking: Reported on  08/13/2019), Disp: 10 tablet, Rfl: 3 .  Misc. Devices MISC, Please provide Benjamin [name] with insurance approved portable O2 concentrator ICD 10 J96.11 [phone] (Patient not taking: Reported on 08/13/2019), Disp: 1 each, Rfl: 0 .  potassium chloride SA (K-DUR) 20 MEQ tablet, Take 2 tablets (40 mEq total) by mouth 2 (two) times daily., Disp: 360 tablet, Rfl: 3 .  torsemide (DEMADEX) 100 MG tablet, Take 1 tablet (100 mg total) by mouth 2 (two) times daily. (Patient not taking: Reported on 08/13/2019), Disp: 180 tablet, Rfl: 3

## 2019-08-14 NOTE — Telephone Encounter (Signed)
Contacted patient per scheduled weekly visit to discuss Exercise and Wellness this morning with request to call again this afternoon. With second call for the day patient was running errands to complete info for housing and therefore requested a call next week as he was unsure when he would be home today. All agreeable with plan. CEP/EWC will pick back up next week with coaching follow-up.    Landis Martins, MS, ACSM-RCEP Clinical Exercise Physiologist

## 2019-08-16 ENCOUNTER — Ambulatory Visit (HOSPITAL_COMMUNITY)
Admit: 2019-08-16 | Discharge: 2019-08-16 | Disposition: A | Discharge status: Home / Self Care | Payer: Medicaid Other | Source: Ambulatory Visit | Attending: Cardiology | Admitting: Cardiology

## 2019-08-16 ENCOUNTER — Other Ambulatory Visit: Payer: Self-pay

## 2019-08-16 ENCOUNTER — Encounter: Payer: Self-pay | Admitting: *Deleted

## 2019-08-16 VITALS — BP 190/100 | HR 94 | Wt >= 6400 oz

## 2019-08-16 DIAGNOSIS — I10 Essential (primary) hypertension: Secondary | ICD-10-CM | POA: Diagnosis not present

## 2019-08-16 DIAGNOSIS — Z79899 Other long term (current) drug therapy: Secondary | ICD-10-CM | POA: Insufficient documentation

## 2019-08-16 DIAGNOSIS — E876 Hypokalemia: Secondary | ICD-10-CM

## 2019-08-16 DIAGNOSIS — Z7901 Long term (current) use of anticoagulants: Secondary | ICD-10-CM | POA: Diagnosis not present

## 2019-08-16 DIAGNOSIS — I480 Paroxysmal atrial fibrillation: Secondary | ICD-10-CM | POA: Insufficient documentation

## 2019-08-16 DIAGNOSIS — Z9981 Dependence on supplemental oxygen: Secondary | ICD-10-CM | POA: Diagnosis not present

## 2019-08-16 DIAGNOSIS — I110 Hypertensive heart disease with heart failure: Secondary | ICD-10-CM | POA: Diagnosis not present

## 2019-08-16 DIAGNOSIS — Z7984 Long term (current) use of oral hypoglycemic drugs: Secondary | ICD-10-CM | POA: Insufficient documentation

## 2019-08-16 DIAGNOSIS — K219 Gastro-esophageal reflux disease without esophagitis: Secondary | ICD-10-CM | POA: Diagnosis not present

## 2019-08-16 DIAGNOSIS — G4733 Obstructive sleep apnea (adult) (pediatric): Secondary | ICD-10-CM | POA: Insufficient documentation

## 2019-08-16 DIAGNOSIS — D649 Anemia, unspecified: Secondary | ICD-10-CM | POA: Diagnosis not present

## 2019-08-16 DIAGNOSIS — I5033 Acute on chronic diastolic (congestive) heart failure: Secondary | ICD-10-CM

## 2019-08-16 DIAGNOSIS — Z452 Encounter for adjustment and management of vascular access device: Secondary | ICD-10-CM | POA: Diagnosis not present

## 2019-08-16 DIAGNOSIS — E119 Type 2 diabetes mellitus without complications: Secondary | ICD-10-CM | POA: Diagnosis not present

## 2019-08-16 DIAGNOSIS — Z87891 Personal history of nicotine dependence: Secondary | ICD-10-CM | POA: Insufficient documentation

## 2019-08-16 DIAGNOSIS — Z8249 Family history of ischemic heart disease and other diseases of the circulatory system: Secondary | ICD-10-CM | POA: Insufficient documentation

## 2019-08-16 DIAGNOSIS — Z86711 Personal history of pulmonary embolism: Secondary | ICD-10-CM | POA: Insufficient documentation

## 2019-08-16 DIAGNOSIS — Z006 Encounter for examination for normal comparison and control in clinical research program: Secondary | ICD-10-CM

## 2019-08-16 LAB — COMPREHENSIVE METABOLIC PANEL
ALT: 20 U/L (ref 0–44)
AST: 19 U/L (ref 15–41)
Albumin: 3.5 g/dL (ref 3.5–5.0)
Alkaline Phosphatase: 51 U/L (ref 38–126)
Anion gap: 9 (ref 5–15)
BUN: 10 mg/dL (ref 6–20)
CO2: 30 mmol/L (ref 22–32)
Calcium: 9 mg/dL (ref 8.9–10.3)
Chloride: 101 mmol/L (ref 98–111)
Creatinine, Ser: 1.15 mg/dL (ref 0.61–1.24)
GFR calc Af Amer: >60 mL/min (ref >60)
GFR calc non Af Amer: >60 mL/min (ref >60)
Glucose, Bld: 118 mg/dL — ABNORMAL HIGH (ref 70–99)
Potassium: 4.3 mmol/L (ref 3.5–5.1)
Sodium: 140 mmol/L (ref 135–145)
Total Bilirubin: 0.4 mg/dL (ref 0.3–1.2)
Total Protein: 7.3 g/dL (ref 6.5–8.1)

## 2019-08-16 LAB — CBC
HCT: 37.9 % — ABNORMAL LOW (ref 39.0–52.0)
Hemoglobin: 10.4 g/dL — ABNORMAL LOW (ref 13.0–17.0)
MCH: 20.8 pg — ABNORMAL LOW (ref 26.0–34.0)
MCHC: 27.4 g/dL — ABNORMAL LOW (ref 30.0–36.0)
MCV: 75.8 fL — ABNORMAL LOW (ref 80.0–100.0)
Platelets: 391 10*3/uL (ref 150–400)
RBC: 5 MIL/uL (ref 4.22–5.81)
RDW: 19.7 % — ABNORMAL HIGH (ref 11.5–15.5)
WBC: 7 10*3/uL (ref 4.0–10.5)
nRBC: 0 % (ref 0.0–0.2)

## 2019-08-16 LAB — BRAIN NATRIURETIC PEPTIDE: B Natriuretic Peptide: 13.9 pg/mL (ref 0.0–100.0)

## 2019-08-16 MED ORDER — POTASSIUM CHLORIDE CRYS ER 20 MEQ PO TBCR: 40.0000 meq | EXTENDED_RELEASE_TABLET | Freq: Two times a day (BID) | ORAL | 3 refills | Status: DC

## 2019-08-16 MED ORDER — METOLAZONE 2.5 MG PO TABS: 2.5000 mg | ORAL_TABLET | ORAL | 3 refills | Status: DC

## 2019-08-16 MED ORDER — TORSEMIDE 100 MG PO TABS: 100.0000 mg | ORAL_TABLET | Freq: Two times a day (BID) | ORAL | 3 refills | Status: DC

## 2019-08-16 NOTE — Patient Instructions (Addendum)
Use Sub-Q Lasix twice a day TODAY AND TOMORROW  Starting Sunday take:   Torsemide 100 mg Twice daily   Metolazone 2.5 mg EVERY Monday  Extra 40 meq (2 tabs) of Potassium EVERY Monday  Restrict your sodium intake to less than 2000mg  per day. This will help prevent your body from holding onto fluid. Read food labels as a lot of canned and packaged foods have a lot of sodium.  Your physician recommends that you schedule a follow-up appointment on:  Thursday December 3rd at 1:30 PM  At the [facility], you and your health needs are our priority. As part of our continuing mission to provide you with exceptional heart care, we have created designated Provider Care Teams. These Care Teams include your primary Cardiologist (physician) and Advanced Practice Providers (APPs- Physician Assistants and Nurse Practitioners) who all work together to provide you with the care you need, when you need it.   You may see any of the following providers on your designated Care Team at your next follow up: Marland Kitchen Dr Glori Bickers . Dr Loralie Champagne . Darrick Grinder, NP . Lyda Jester, PA   Please be sure to bring in all your medications bottles to every appointment.

## 2019-08-16 NOTE — Research (Signed)
Pt came in for Day 2-4 for [facility] HF study. Per pt he has little SOB compared to Tuesday, sleeping well at night and not having to sleep in the chair. Pt stated that "I place the second device on 11/25 at 0805 and got up from the table at ripped it off at 0808. I did not get any of the medication." patient instructed to call Coordinator if this happens again. Patient verbalized understanding. Comfort of wear questionnaire completed.Patient sent home with 4 Furoscix infusors. Will  call patient on Dec 1 and pt will follow up in clinic Dec 3rd.

## 2019-08-16 NOTE — Progress Notes (Signed)
Advanced Heart Failure Clinic Note   Referring Physician: PCP: Benjamin Pounds, NP PCP-Cardiologist: Benjamin Carnes, MD  [facility]: Benjamin [name]   HPI:  Benjamin [name] a 53 yo with hx of diastolic CHF, HTN, DM, Atrial fib, PE in April 2020 (on Xarelto), OSA, GERD,chronic elevation of troponin.  Admitted 01/21/2019 with increased dyspnea and had PE. He was anticoagulated.   Patient re-admitted 5/20 for syncope/cough/dyspnea, COVID+, CT positive for bilateral GGOs, admitted to [facility] and discharged after 6 days, on O2. Rec'd Cards f/u.  Patient re-admitted 02/20/19 for bright red blood per rectum, eval'ed by GI, had EGD/colon, dx'd with hemorrhoidal bleeding, discharged after 9 days on O2.He was again Covid + .   Readmitted 03/11/19 with increased shortness of breath. He did not have home oxygen. He was discharged to home the next day.  He [facility]23/20with lower extremity edema. COVID negative. Diuresed with lasix drip and transitioned to torsemide 40 mg twice daily. Had [place] with mild volume overload and preserved cardiac output. Discharge weight was 407.9 [name].   He was readmitted with symptomatic anemia 05/28/19 with hgb 7.1. Received 1UPRBCs. GI consulted. He started on anusol and continued on stool softner.  He had f/u visit 10/20 and was complaining of increased leg edema and weight gain. SOB with exertion. DeniedPND. + Orthopnea. Nobleeding issues.Was eating high sodium food -->Bo jangles chicken. No fever or chills. Taking all medications. Followed closely by HF Paramedicine.He was given a dose of IV Lasix in clinic, 80 mg w/ >1300 urine output after IV lasix.His home torsemide dose was increased to 100 bid and he was instructed to f/u in 2 weeks.   He was readmitted to the hospital on 07/16/19 when he presented to the ED w/ worsening symptoms and was admitted by IM for a/c diastolic CHF and treated w/ IV Lasix. Had 2 day hospital stay and was discharged on 10/29.  Echo was done 10/28 and showed normal LVEF 65-70%, RV interpreted as normal but suspect significant RV dysfunction.  After diuresis w/ IV Lasix, he was placed back on home diuretic regimen, torsemide 100 mg bid. Lisinopril 5 mg was also added to regimen. AHF team was not consulted that admit.  He was seen back in clinic 07/30/19. Felt poorly. SOB at rest and worse w/ exertion. Also w/ orthopnea/PND. Significant wt gain of 20 lb up to 428 lb (dry wt ~407 lb), w/ marked abdominal distention and LEE and poor response to IV diuretics. He was directly admitted from clinic to [facility] for IV diuretics.  He was placed on IV Lasix 80 mg bid w/ great response. PICC line was placed to measure CVP. Net diuresis over 15 L. Diuresed down from admit wt of 427 lb to dry wt of 408 lb at discharge. CVP down to 8-9. He was transitioned back to PO torsemide 100 mg bid. He had slight bump in SCr to 1.2 and lisinopril was discontinued. Labetalol increased to 400 mg bid. Spironolactone was also added.   He was seen in the ER 11/24 with volume overload/weight gain.  He was enrolled in the FREEDOM trial and has been getting subcutaneous Lasix.  He says that he had a good response to this.  Weight is down 2 lbs compared to prior office visit.  However, patient continues to report significant dietary indiscretion with large sodium intake on Thanksgiving day.  He continues to use home oxygen during the day and CPAP at night.  BP very high this morning but has not taken  his BP meds. No chest pain.  He is still short of breath walking more than about 50 feete.  Still with significant lower extremity edema. No palpitations, he has been in NSR.   Labs (11/20): BNP 32, K 3.6, creatinine 1.09  ECG (11/20): NSR, normal (personally reviewed).   Review of Systems: All systems reviewed and negative except as per HPI.   PMH: 1. Atrial fibrillation: Paroxysmal.  2. Pulmonary embolus: 5/20.  3. OHS/OSA: He is on home oxygen during the day and  uses CPAP at night.  4. Morbid obesity.  5. Chronic diastolic CHF:  - RHC (2/72): mean RA 12, PA 40/25 mean 31, mean PCWP 23, CI 2.47, PVR 1.1 WU.  - Echo (10/20): EF 65-70%, mild LVH, normal RV size and systolic function.  6. Type 2 diabetes 7. HTN 8. Rectal bleeding: ?Hemorrhoidal.  9. COVID-19 infection 7/20.   Current Outpatient Medications  Medication Sig Dispense Refill  . albuterol (VENTOLIN HFA) 108 (90 Base) MCG/ACT inhaler Inhale 2 puffs into the lungs every 6 (six) hours as needed for wheezing or shortness of breath. 18 g 1  . amLODipine (NORVASC) 10 MG tablet Take 1 tablet (10 mg total) by mouth daily. 90 tablet 1  . atorvastatin (LIPITOR) 20 MG tablet Take 1 tablet (20 mg total) by mouth at bedtime. 90 tablet 2  . Blood Glucose Monitoring Suppl (TRUE METRIX METER) w/Device KIT Use as instructed. Check blood glucose level by fingerstick twice per day. 1 kit 0  . carboxymethylcellulose (REFRESH PLUS) 0.5 % SOLN Place 1 drop into both eyes daily.     Marland Kitchen docusate sodium (COLACE) 100 MG capsule Take 1 capsule (100 mg total) by mouth 2 (two) times daily. 180 capsule 1  . ferrous sulfate 325 (65 FE) MG tablet Take 1 tablet (325 mg total) by mouth daily. Patient will pick up scripts today. 90 tablet 0  . glucose blood (TRUE METRIX BLOOD GLUCOSE TEST) test strip Use as instructed. Check blood glucose level by fingerstick twice per day. 100 each 12  . labetalol (NORMODYNE) 200 MG tablet Take 2 tablets (400 mg total) by mouth 2 (two) times daily. 120 tablet 3  . levalbuterol (XOPENEX HFA) 45 MCG/ACT inhaler Inhale 2 puffs into the lungs every 4 (four) hours as needed for wheezing.     . metFORMIN (GLUCOPHAGE) 500 MG tablet Take 1 tablet (500 mg total) by mouth 2 (two) times daily with a meal. Patient will pick up scripts today. 180 tablet 1  . metolazone (ZAROXOLYN) 2.5 MG tablet Take 1 tablet (2.5 mg total) by mouth once a week. Every Monday 10 tablet 3  . Misc. Devices MISC Please provide  Benjamin [name] with insurance approved portable O2 concentrator ICD 10 J96.11 Z99.81 1 each 0  . Misc. Devices MISC Requires O2 @ 3L/min continuously via nasal canula and home fill system,. 1 each 0  . omeprazole (PRILOSEC) 20 MG capsule Take 1 capsule (20 mg total) by mouth daily. 30 capsule 3  . potassium chloride SA (KLOR-CON) 20 MEQ tablet Take 2 tablets (40 mEq total) by mouth 2 (two) times daily. Take an extra 2 tabs every Monday 360 tablet 3  . rivaroxaban (XARELTO) 20 MG TABS tablet Take 1 tablet (20 mg total) by mouth daily with supper. 60 tablet 6  . spironolactone (ALDACTONE) 25 MG tablet Take 1 tablet (25 mg total) by mouth daily. 30 tablet 3  . TRUEplus Lancets 28G MISC Use as instructed. Check blood glucose level by  fingerstick twice per day. 200 each 3  . [START ON 08/18/2019] torsemide (DEMADEX) 100 MG tablet Take 1 tablet (100 mg total) by mouth 2 (two) times daily. 180 tablet 3   No current facility-administered medications for this encounter.     No Known Allergies    Social History   Socioeconomic History  . Marital status: Single    Spouse name: Not on file  . Number of children: Not on file  . Years of education: Not on file  . Highest education level: Not on file  Occupational History  . Not on file  Social Needs  . Financial resource strain: Not on file  . Food insecurity    Worry: Not on file    Inability: Not on file  . Transportation needs    Medical: Not on file    Non-medical: Not on file  Tobacco Use  . Smoking status: Former Research scientist (life sciences)  . Smokeless tobacco: Never Used  Substance and Sexual Activity  . Alcohol use: Not Currently  . Drug use: Not Currently  . Sexual activity: Not Currently  Lifestyle  . Physical activity    Days per week: Not on file    Minutes per session: Not on file  . Stress: Not on file  Relationships  . Social Herbalist on phone: Not on file    Gets together: Not on file    Attends religious service: Not on file     Active member of club or organization: Not on file    Attends meetings of clubs or organizations: Not on file    Relationship status: Not on file  . Intimate partner violence    Fear of current or ex partner: Not on file    Emotionally abused: Not on file    Physically abused: Not on file    Forced sexual activity: Not on file  Other Topics Concern  . Not on file  Social History Narrative  . Not on file      Family History  Problem Relation Age of Onset  . Hypertension Mother   . Diabetes Mother     Vitals:   08/16/19 0803  BP: (!) 190/100  Pulse: 94  SpO2: 98%  Weight: (!) 192.4 kg (424 lb 2 oz)     PHYSICAL EXAM: General: NAD, obese Neck: Thick, JVP 10 cm, no thyromegaly or thyroid nodule.  Lungs: Clear to auscultation bilaterally with normal respiratory effort. CV: Nonpalpable PMI.  Heart regular S1/S2, no S3/S4, no murmur.  2+ chronic edema to knees.  No carotid bruit.  Unable to palpate pedal pulses.  Abdomen: Soft, nontender, no hepatosplenomegaly, no distention.  Skin: Intact without lesions or rashes.  Neurologic: Alert and oriented x 3.  Psych: Normal affect. Extremities: No clubbing or cyanosis.  HEENT: Normal.    ASSESSMENT & PLAN:  1. Acute on chronic diastolic CHF: Echo in 93/57 with EF 65-70%, RV interpreted as normal but suspect significant RV dysfunction. He remains significantly volume overloaded likely due to dietary indiscretion with sodium. He reports good UOP with [place] Lasix.  - I will give him 4 more doses of [place] Lasix, he will take it bid today and tomorrow.  He will then resume torsemide 100 mg bid.  I will have him start metolazone 2.5 mg qwk on Monday.  - He will continue KCl 40 bid but will take an extra 20 KCl on metolazone days.  - BMET today and again in 1 week.   -  Continue spironolactone 25 mg daily.  - Discussion again about sodium restriction in diet.  - Continue w/ Paramedicine  2. Atrial fibrillation: Paroxysmal. NSR recently.   - Continue Xarelto.  3. H/o PE: 5/20, continue Xarelto.  4. OHS/OSA: Continue CPAP at night and oxygen during the day.   Start Date for FREEDOM HF Trial : 08/13/19  Any change in the diuretic regimen since last point of contact?   Abdominal skin assessment is without evidence of skin reaction? yes Admitted to hospital since last ED visit? no Admitted to hospital for heart failure related issues? no Evaluated in the emergency department for HF related issues? Not since trial enrollment Office visit for HF related issues? Yes today Sought any additional or new medical treatment for HF related issues? no  Any problems with drug or device? Had failure of 1 device.   NYHA Class? 3  Check CBC, BMET, Mag, BNP today.    He will have followup next week.   Benjamin [name] 08/16/2019 11:03 AM

## 2019-08-19 ENCOUNTER — Other Ambulatory Visit: Payer: Self-pay

## 2019-08-19 ENCOUNTER — Telehealth (HOSPITAL_COMMUNITY): Payer: Self-pay | Admitting: Licensed Clinical Social Worker

## 2019-08-19 ENCOUNTER — Encounter (HOSPITAL_COMMUNITY): Payer: Medicaid Other

## 2019-08-19 ENCOUNTER — Encounter: Payer: Self-pay | Admitting: Pulmonary Disease

## 2019-08-19 ENCOUNTER — Ambulatory Visit (INDEPENDENT_AMBULATORY_CARE_PROVIDER_SITE_OTHER): Payer: Medicaid Other | Admitting: Pulmonary Disease

## 2019-08-19 ENCOUNTER — Other Ambulatory Visit (HOSPITAL_COMMUNITY): Payer: Self-pay

## 2019-08-19 DIAGNOSIS — Z9989 Dependence on other enabling machines and devices: Secondary | ICD-10-CM | POA: Diagnosis not present

## 2019-08-19 DIAGNOSIS — G4733 Obstructive sleep apnea (adult) (pediatric): Secondary | ICD-10-CM

## 2019-08-19 DIAGNOSIS — J9611 Chronic respiratory failure with hypoxia: Secondary | ICD-10-CM

## 2019-08-19 DIAGNOSIS — E662 Morbid (severe) obesity with alveolar hypoventilation: Secondary | ICD-10-CM | POA: Diagnosis not present

## 2019-08-19 NOTE — Telephone Encounter (Signed)
CSW assisted pt in [place] office to confirm that pt address change had gone in and to confirm how we needed to restart payments since patient will now be paying rent.   Social Security able to confirm that address change is in and provided fax number to send lease agreement to for confirmation that pt would be paying rent. Paramedic bringing hard copy of lease to [place] to fax over for review.  CSW able to get patient a comforter/sheets/pillow/towels to get him started.  CSW will continue to follow and assist as needed  [name], [facility] Desk#: [phone] Cell#: [phone]

## 2019-08-19 NOTE — Progress Notes (Signed)
Paramedicine Encounter    Patient ID: Benjamin [name], male    DOB: 21-Jan-1966, 53 y.o.   MRN: [id number]   Patient Care Team: [name], NP as PCP - General (Nurse Practitioner) [name], MD as PCP - Cardiology (Cardiology) [name], LCSW as Social Worker (Licensed Clinical Social Worker)  Patient Active Problem List   Diagnosis Date Noted  . Acute on chronic diastolic CHF (congestive heart failure), NYHA class 3 ([place]) 07/30/2019  . Diabetes mellitus without complication ([place]) 76/16/0737  . Hypertensive emergency   . BRBPR (bright red blood per rectum) 06/12/2019  . Hypertension   . CHF (congestive heart failure) ([place])   . Sleep apnea   . Chronic respiratory failure ([place])   . Lower GI bleeding 06/11/2019  . Hypokalemia 05/29/2019  . GI bleed 05/29/2019  . Anemia 05/28/2019  . Acute on chronic diastolic CHF (congestive heart failure) ([place]) 04/11/2019  . Iron deficiency anemia 03/11/2019  . OSA on CPAP 03/11/2019  . HLD (hyperlipidemia) 03/11/2019  . Elevated troponin 03/11/2019  . GERD (gastroesophageal reflux disease) 03/11/2019  . Rectal bleeding 02/20/2019  . Dyspnea 02/06/2019  . COVID-19 virus infection 02/06/2019  . Bilateral lower extremity edema   . Morbid obesity with BMI of 50.0-59.9, adult ([place])   . Chronic diastolic CHF (congestive heart failure) ([place])   . PAF (paroxysmal atrial fibrillation) ([place])   . PE (pulmonary thromboembolism) ([place]) 01/21/2019  . Hypertensive urgency 01/21/2019  . Diabetes mellitus type 2 in obese ([place]) 01/21/2019    Current Outpatient Medications:  .  albuterol (VENTOLIN HFA) 108 (90 Base) MCG/ACT inhaler, Inhale 2 puffs into the lungs every 6 (six) hours as needed for wheezing or shortness of breath., Disp: 18 g, Rfl: 1 .  amLODipine (NORVASC) 10 MG tablet, Take 1 tablet (10 mg total) by mouth daily., Disp: 90 tablet, Rfl: 1 .  atorvastatin (LIPITOR) 20 MG tablet, Take 1 tablet (20 mg total) by mouth at bedtime.,  Disp: 90 tablet, Rfl: 2 .  Blood Glucose Monitoring Suppl (TRUE METRIX METER) w/Device KIT, Use as instructed. Check blood glucose level by fingerstick twice per day., Disp: 1 kit, Rfl: 0 .  carboxymethylcellulose (REFRESH PLUS) 0.5 % SOLN, Place 1 drop into both eyes daily. , Disp: , Rfl:  .  docusate sodium (COLACE) 100 MG capsule, Take 1 capsule (100 mg total) by mouth 2 (two) times daily., Disp: 180 capsule, Rfl: 1 .  ferrous sulfate 325 (65 FE) MG tablet, Take 1 tablet (325 mg total) by mouth daily. Patient will pick up scripts today., Disp: 90 tablet, Rfl: 0 .  glucose blood (TRUE METRIX BLOOD GLUCOSE TEST) test strip, Use as instructed. Check blood glucose level by fingerstick twice per day., Disp: 100 each, Rfl: 12 .  labetalol (NORMODYNE) 200 MG tablet, Take 2 tablets (400 mg total) by mouth 2 (two) times daily., Disp: 120 tablet, Rfl: 3 .  metFORMIN (GLUCOPHAGE) 500 MG tablet, Take 1 tablet (500 mg total) by mouth 2 (two) times daily with a meal. Patient will pick up scripts today., Disp: 180 tablet, Rfl: 1 .  metolazone (ZAROXOLYN) 2.5 MG tablet, Take 1 tablet (2.5 mg total) by mouth once a week. Every Monday, Disp: 10 tablet, Rfl: 3 .  Misc. Devices MISC, Please provide Mr. Pauley with insurance approved portable O2 concentrator ICD 10 J96.11 Z99.81, Disp: 1 each, Rfl: 0 .  Misc. Devices MISC, Requires O2 @ 3L/min continuously via nasal canula and home fill system,., Disp: 1  each, Rfl: 0 .  omeprazole (PRILOSEC) 20 MG capsule, Take 1 capsule (20 mg total) by mouth daily., Disp: 30 capsule, Rfl: 3 .  potassium chloride SA (KLOR-CON) 20 MEQ tablet, Take 2 tablets (40 mEq total) by mouth 2 (two) times daily. Take an extra 2 tabs every Monday, Disp: 360 tablet, Rfl: 3 .  rivaroxaban (XARELTO) 20 MG TABS tablet, Take 1 tablet (20 mg total) by mouth daily with supper., Disp: 60 tablet, Rfl: 6 .  spironolactone (ALDACTONE) 25 MG tablet, Take 1 tablet (25 mg total) by mouth daily., Disp: 30  tablet, Rfl: 3 .  torsemide (DEMADEX) 100 MG tablet, Take 1 tablet (100 mg total) by mouth 2 (two) times daily., Disp: 180 tablet, Rfl: 3 .  TRUEplus Lancets 28G MISC, Use as instructed. Check blood glucose level by fingerstick twice per day., Disp: 200 each, Rfl: 3 .  levalbuterol (XOPENEX HFA) 45 MCG/ACT inhaler, Inhale 2 puffs into the lungs every 4 (four) hours as needed for wheezing. , Disp: , Rfl:  No Known Allergies    Social History   Socioeconomic History  . Marital status: Single    Spouse name: Not on file  . Number of children: Not on file  . Years of education: Not on file  . Highest education level: Not on file  Occupational History  . Not on file  Social Needs  . Financial resource strain: Not on file  . Food insecurity    Worry: Not on file    Inability: Not on file  . Transportation needs    Medical: Not on file    Non-medical: Not on file  Tobacco Use  . Smoking status: Former Research scientist (life sciences)  . Smokeless tobacco: Never Used  Substance and Sexual Activity  . Alcohol use: Not Currently  . Drug use: Not Currently  . Sexual activity: Not Currently  Lifestyle  . Physical activity    Days per week: Not on file    Minutes per session: Not on file  . Stress: Not on file  Relationships  . Social Herbalist on phone: Not on file    Gets together: Not on file    Attends religious service: Not on file    Active member of club or organization: Not on file    Attends meetings of clubs or organizations: Not on file    Relationship status: Not on file  . Intimate partner violence    Fear of current or ex partner: Not on file    Emotionally abused: Not on file    Physically abused: Not on file    Forced sexual activity: Not on file  Other Topics Concern  . Not on file  Social History Narrative  . Not on file    Physical Exam      Future Appointments  Date Time Provider [place]  08/19/2019 12:00 PM [name], MD [facility] None   08/22/2019  1:30 PM [facility] PA/NP [facility] None  09/10/2019 10:00 AM [facility] PA/NP [facility] None  09/30/2019  1:30 PM [name], NP [facility] None    BP (!) 190/0   Pulse 90   Resp 18   Wt (!) 406 lb (184.2 kg)   SpO2 96%   BMI 56.63 kg/m   Weight yesterday-407 Last visit weight-424 @ clinic    Pt reports he is feeling good today. Today is moving day for him so he is excited.  He did not take his meds the past couple days  due to confusion of the med changes from Friday.  He had an issue with the last dose of the [place] lasix and he accidentally pulled it off so he didn't get the whole dose.  He denies increased sob. He denies c/p. He denies dizziness. He does have some edema to legs but not too bad.  --needs spiro--called pharmacy and victor will get it ready for him and deliver tomor. Gave pharmacy new address for him and phone number. Since he is moving we need to focus more on his diet plan and ensuring he gets help on cooking his own foods as he is being on his own now and nobody will be cooking for him. Since he is moving now, he is not wanting to take his torsemide this morning dose but will resume after lunch for his next dose with the metolazone day.   [name], [facility] Paramedic  08/19/19

## 2019-08-19 NOTE — Progress Notes (Signed)
[facility]  Chief Complaint  Patient presents with  . OSA (obstructive sleep apnea)    Constitutional:  There were no vitals taken for this visit.  Deferred  Past Medical History:  PE, HTN, DM, A fib, PNA May 2020  Brief Summary:  Benjamin [name] is a 53 y.o. male with obstructive sleep apnea.  Virtual Visit via Telephone Note  I connected with Benjamin [name] on 08/19/19 at 12:00 PM EST by telephone and verified that I am speaking with the correct person using two identifiers.  Location: Patient: home Provider: medical office   I discussed the limitations, risks, security and privacy concerns of performing an evaluation and management service by telephone and the availability of in person appointments. I also discussed with the patient that there may be a patient responsible charge related to this service. The patient expressed understanding and agreed to proceed.  He has been using CPAP.  Has a respironics dreamwear full face mask.  This is comfortable, but needs replacement.  Using 2 to 3 liters oxygen with CPAP and during the day.  Feels more rested and energetic.  Not having sinus congestion, sore throat, or dry mouth.  Needs a bag to help carry his oxygen around during the day.  He said his Education officer, museum will help get this set up.  Physical Exam:  Deferred.   Assessment/Plan:   Obstructive sleep apnea. - he reports compliance with CPAP and benefit from therapy - uses [employer] for his DME - continue CPAP 17 cm H2O - will get copy of his CPAP download - will arrange for replacement CPAP mask  Chronic hypoxic/hypercapnic respiratory failure from obesity hypoventilation syndrome. - continue 2 to 3 liters oxygen 24/7 including with CPAP at night - goal SpO2 > 90%  Morbid obesity. - discussed importance of weight loss  Patient Instructions  Will get copy of your CPAP report and arrange for replacement CPAP mask  Follow  up in 6 months    I discussed the assessment and treatment plan with the patient. The patient was provided an opportunity to ask questions and all were answered. The patient agreed with the plan and demonstrated an understanding of the instructions.   The patient was advised to call back or seek an in-person evaluation if the symptoms worsen or if the condition fails to improve as anticipated.  I provided 16 minutes of non-face-to-face time during this encounter.   [name], MD [facility] Pager: [phone] 08/19/2019, 12:26 PM  Flow Sheet    Pulmonary tests:  ABG RA 04/19/19 >> pH 7.44, PCO2 49.5, PO2 59.9  Chest imaging:  CT angio chest 01/21/19 >> b/l lower lobe PE CT angio chest 02/26/19 >> no PE  Sleep tests:  PSG 05/17/19 >> AHI 128.1, SpO2 low 51%.  CPAP 17 cm H2O with 2 liters oxygen.  Cardiac tests:  Echo 01/22/19 >> EF 60 to 65%, mild LVH RHC 04/17/19 >> pulmonary venous hypertension  Medications:   Allergies as of 08/19/2019   No Known Allergies     Medication List       Accurate as of August 19, 2019 12:26 PM. If you have any questions, ask your nurse or doctor.        albuterol 108 (90 Base) MCG/ACT inhaler Commonly known as: VENTOLIN HFA Inhale 2 puffs into the lungs every 6 (six) hours as needed for wheezing or shortness of breath.   amLODipine 10 MG tablet Commonly known as:  NORVASC Take 1 tablet (10 mg total) by mouth daily.   atorvastatin 20 MG tablet Commonly known as: LIPITOR Take 1 tablet (20 mg total) by mouth at bedtime.   carboxymethylcellulose 0.5 % Soln Commonly known as: REFRESH PLUS Place 1 drop into both eyes daily.   docusate sodium 100 MG capsule Commonly known as: COLACE Take 1 capsule (100 mg total) by mouth 2 (two) times daily.   ferrous sulfate 325 (65 FE) MG tablet Take 1 tablet (325 mg total) by mouth daily. Patient will pick up scripts today.   labetalol 200 MG tablet Commonly known as:  NORMODYNE Take 2 tablets (400 mg total) by mouth 2 (two) times daily.   levalbuterol 45 MCG/ACT inhaler Commonly known as: XOPENEX HFA Inhale 2 puffs into the lungs every 4 (four) hours as needed for wheezing.   metFORMIN 500 MG tablet Commonly known as: GLUCOPHAGE Take 1 tablet (500 mg total) by mouth 2 (two) times daily with a meal. Patient will pick up scripts today.   metolazone 2.5 MG tablet Commonly known as: ZAROXOLYN Take 1 tablet (2.5 mg total) by mouth once a week. Every Monday   Misc. Devices Misc Please provide Mr. Saling with insurance approved portable O2 concentrator ICD 10 J96.11 Z99.81   Misc. Devices Misc Requires O2 @ 3L/min continuously via nasal canula and home fill system,.   omeprazole 20 MG capsule Commonly known as: PRILOSEC Take 1 capsule (20 mg total) by mouth daily.   potassium chloride SA 20 MEQ tablet Commonly known as: KLOR-CON Take 2 tablets (40 mEq total) by mouth 2 (two) times daily. Take an extra 2 tabs every Monday   rivaroxaban 20 MG Tabs tablet Commonly known as: XARELTO Take 1 tablet (20 mg total) by mouth daily with supper.   spironolactone 25 MG tablet Commonly known as: ALDACTONE Take 1 tablet (25 mg total) by mouth daily.   torsemide 100 MG tablet Commonly known as: DEMADEX Take 1 tablet (100 mg total) by mouth 2 (two) times daily.   True Metrix Blood Glucose Test test strip Generic drug: glucose blood Use as instructed. Check blood glucose level by fingerstick twice per day.   True Metrix Meter w/Device Kit Use as instructed. Check blood glucose level by fingerstick twice per day.   TRUEplus Lancets 28G Misc Use as instructed. Check blood glucose level by fingerstick twice per day.       Past Surgical History:  He  has a past surgical history that includes No past surgeries; Esophagogastroduodenoscopy (egd) with propofol (Left, 02/25/2019); Colonoscopy with propofol (Left, 02/25/2019); RIGHT HEART CATH (N/A, 04/17/2019);  Esophagogastroduodenoscopy (egd) with propofol (N/A, 06/12/2019); Flexible sigmoidoscopy (N/A, 06/12/2019); and Hemorrhoid banding (05/2019).  Family History:  His family history includes Diabetes in his mother; Hypertension in his mother.  Social History:  He  reports that he has quit smoking. He has never used smokeless tobacco. He reports previous alcohol use. He reports previous drug use.

## 2019-08-19 NOTE — Patient Instructions (Signed)
Will get copy of your CPAP report and arrange for replacement CPAP mask  Follow up in 6 months

## 2019-08-21 ENCOUNTER — Telehealth: Payer: Self-pay | Admitting: *Deleted

## 2019-08-21 ENCOUNTER — Telehealth (HOSPITAL_COMMUNITY): Payer: Self-pay | Admitting: Licensed Clinical Social Worker

## 2019-08-21 ENCOUNTER — Other Ambulatory Visit (HOSPITAL_COMMUNITY): Payer: Self-pay

## 2019-08-21 ENCOUNTER — Telehealth (HOSPITAL_COMMUNITY): Payer: Self-pay | Admitting: *Deleted

## 2019-08-21 DIAGNOSIS — Z006 Encounter for examination for normal comparison and control in clinical research program: Secondary | ICD-10-CM

## 2019-08-21 NOTE — Progress Notes (Signed)
Pt having financial difficulty at this time and expressed need for food.  [name] wrote referral for [facility] [employer] [facility] and I picked up food from pantry to take to him.  [name], EMT-Paramedic  08/21/19
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
## 2019-08-21 NOTE — Telephone Encounter (Addendum)
Freedom Day 7 phone call visit    Date of Phone Call (DD/MON/YYYY): 20-Aug-2019   Phone Call  performed: [x] YES   []  NO   Did the subject experience any problems with drug or device use since last point of contact? [x] YES   []  NO  See note   Has the subject been prescribed with an additional dose of treatment since last point of contact ? [] YES   [x]  NO   Weight: 403   Unit: [] Kilograms  [x] Pounds   Weight not done: []    Have you experienced any shortness of breath? [] YES   [x]  NO   Have you noticed swelling of your ankles/legs or abdomen? [] YES   [x]  NO   Last night, did you have difficulty sleeping? [] YES   [x]  NO   Did you have any other symptoms from your heart failure that is different than the previous day?  [] YES   [x]  NO   Did the subject start any new medications/supplements or have any medications /supplements change since last point of contact? [] YES   [x]  NO   Did the subject have any hospital visits, emergency department visits or unscheduled clinic visits since last point of contact? [] YES   [x]  NO   Did the subject experience any Adverse Event since last point of contact? [] YES   [x]  NO   Did the subject experience any problems with drug or device use since last point of contact? [] YES   [x]  NO   Has the subject been prescribed with an additional dose of treatment since last point of contact? [] YES   [x]  NO

## 2019-08-21 NOTE — Telephone Encounter (Signed)
Spoke with patient via phone for [facility] HF Day 7 visit. Patient doing good with minimal SOB. Per pt weight is down to 403. Pt in process of moving and does not have subject diary, but did take all 4 doses of Furoscix. He states " I only got part of one because I fell asleep with one on and when I woke up I tore it off. I bleed little and the blood back up in the medication. I have all the times wrote down and will bring them and devices tomorrow to my appointment." Pt will follow up with Dr. Aundra Dubin 07/23/2019 at 1:30
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
## 2019-08-22 ENCOUNTER — Other Ambulatory Visit (HOSPITAL_COMMUNITY): Payer: Self-pay

## 2019-08-22 ENCOUNTER — Telehealth (HOSPITAL_COMMUNITY): Payer: Self-pay | Admitting: Licensed Clinical Social Worker

## 2019-08-22 ENCOUNTER — Other Ambulatory Visit: Payer: Self-pay

## 2019-08-22 ENCOUNTER — Ambulatory Visit (HOSPITAL_COMMUNITY)
Admission: RE | Admit: 2019-08-22 | Discharge: 2019-08-22 | Disposition: A | Discharge status: Home / Self Care | Payer: Medicaid Other | Source: Ambulatory Visit | Attending: Adult Health | Admitting: Adult Health

## 2019-08-22 ENCOUNTER — Encounter (HOSPITAL_COMMUNITY): Payer: Self-pay

## 2019-08-22 VITALS — BP 178/88 | HR 85 | Wt >= 6400 oz

## 2019-08-22 DIAGNOSIS — Z6841 Body Mass Index (BMI) 40.0 and over, adult: Secondary | ICD-10-CM | POA: Insufficient documentation

## 2019-08-22 DIAGNOSIS — Z86711 Personal history of pulmonary embolism: Secondary | ICD-10-CM | POA: Insufficient documentation

## 2019-08-22 DIAGNOSIS — K219 Gastro-esophageal reflux disease without esophagitis: Secondary | ICD-10-CM | POA: Insufficient documentation

## 2019-08-22 DIAGNOSIS — E662 Morbid (severe) obesity with alveolar hypoventilation: Secondary | ICD-10-CM | POA: Insufficient documentation

## 2019-08-22 DIAGNOSIS — I110 Hypertensive heart disease with heart failure: Secondary | ICD-10-CM | POA: Insufficient documentation

## 2019-08-22 DIAGNOSIS — I5032 Chronic diastolic (congestive) heart failure: Secondary | ICD-10-CM | POA: Diagnosis not present

## 2019-08-22 DIAGNOSIS — I480 Paroxysmal atrial fibrillation: Secondary | ICD-10-CM | POA: Diagnosis not present

## 2019-08-22 DIAGNOSIS — Z7689 Persons encountering health services in other specified circumstances: Secondary | ICD-10-CM | POA: Diagnosis not present

## 2019-08-22 DIAGNOSIS — Z79899 Other long term (current) drug therapy: Secondary | ICD-10-CM | POA: Insufficient documentation

## 2019-08-22 DIAGNOSIS — Z87891 Personal history of nicotine dependence: Secondary | ICD-10-CM | POA: Insufficient documentation

## 2019-08-22 DIAGNOSIS — Z8619 Personal history of other infectious and parasitic diseases: Secondary | ICD-10-CM | POA: Insufficient documentation

## 2019-08-22 DIAGNOSIS — E119 Type 2 diabetes mellitus without complications: Secondary | ICD-10-CM | POA: Insufficient documentation

## 2019-08-22 DIAGNOSIS — Z8249 Family history of ischemic heart disease and other diseases of the circulatory system: Secondary | ICD-10-CM | POA: Insufficient documentation

## 2019-08-22 DIAGNOSIS — Z7984 Long term (current) use of oral hypoglycemic drugs: Secondary | ICD-10-CM | POA: Insufficient documentation

## 2019-08-22 DIAGNOSIS — Z833 Family history of diabetes mellitus: Secondary | ICD-10-CM | POA: Insufficient documentation

## 2019-08-22 DIAGNOSIS — Z7901 Long term (current) use of anticoagulants: Secondary | ICD-10-CM | POA: Insufficient documentation

## 2019-08-22 LAB — BASIC METABOLIC PANEL
Anion gap: 8 (ref 5–15)
BUN: 10 mg/dL (ref 6–20)
CO2: 28 mmol/L (ref 22–32)
Calcium: 9.1 mg/dL (ref 8.9–10.3)
Chloride: 104 mmol/L (ref 98–111)
Creatinine, Ser: 1.12 mg/dL (ref 0.61–1.24)
GFR calc Af Amer: >60 mL/min (ref >60)
GFR calc non Af Amer: >60 mL/min (ref >60)
Glucose, Bld: 111 mg/dL — ABNORMAL HIGH (ref 70–99)
Potassium: 3.7 mmol/L (ref 3.5–5.1)
Sodium: 140 mmol/L (ref 135–145)

## 2019-08-22 LAB — CBC
HCT: 37.2 % — ABNORMAL LOW (ref 39.0–52.0)
Hemoglobin: 10.3 g/dL — ABNORMAL LOW (ref 13.0–17.0)
MCH: 20.7 pg — ABNORMAL LOW (ref 26.0–34.0)
MCHC: 27.7 g/dL — ABNORMAL LOW (ref 30.0–36.0)
MCV: 74.7 fL — ABNORMAL LOW (ref 80.0–100.0)
Platelets: 382 10*3/uL (ref 150–400)
RBC: 4.98 MIL/uL (ref 4.22–5.81)
RDW: 20.2 % — ABNORMAL HIGH (ref 11.5–15.5)
WBC: 8.6 10*3/uL (ref 4.0–10.5)
nRBC: 0 % (ref 0.0–0.2)

## 2019-08-22 LAB — BRAIN NATRIURETIC PEPTIDE: B Natriuretic Peptide: 42.8 pg/mL (ref 0.0–100.0)

## 2019-08-22 LAB — MAGNESIUM: Magnesium: 2 mg/dL (ref 1.7–2.4)

## 2019-08-22 NOTE — Telephone Encounter (Signed)
Pt was able to reach [employer] to request food stamps be initiated now that he is out of his previous living situation- CSW received call from [facility] worker to verify that pt is paying $550 for his current living situation- they will hopefully be able to send out his food stamp card this week.  Pt also received call from Social Security that they have processed his new living arrangements and will be depositing his December payment with the week.  Pt social security card has now come in the mail so CSW set up housing appt on 09/11/19 with [facility], [address], and [employer]- pt updated.  CSW will continue to follow and assist as needed  [name], [facility] Desk#: [phone] Cell#: [phone]

## 2019-08-22 NOTE — Patient Instructions (Signed)
It was great to see you today! No medication changes are needed at this time.  Labs today We will only contact you if something comes back abnormal or we need to make some changes. Otherwise no news is good news!  Keep follow up as scheduled in 3 weeks  Do the following things EVERYDAY: 1) Weigh yourself in the morning before breakfast. Write it down and keep it in a log. 2) Take your medicines as prescribed 3) Eat low salt foods-Limit salt (sodium) to 2000 mg per day.  4) Stay as active as you can everyday 5) Limit all fluids for the day to less than 2 liters  At the [facility], you and your health needs are our priority. As part of our continuing mission to provide you with exceptional heart care, we have created designated Provider Care Teams. These Care Teams include your primary Cardiologist (physician) and Advanced Practice Providers (APPs- Physician Assistants and Nurse Practitioners) who all work together to provide you with the care you need, when you need it.   You may see any of the following providers on your designated Care Team at your next follow up: Marland Kitchen Dr Glori Bickers . Dr Loralie Champagne . Darrick Grinder, NP . Lyda Jester, PA . Audry Riles, PharmD   Please be sure to bring in all your medications bottles to every appointment.  Marland Kitchen

## 2019-08-22 NOTE — Progress Notes (Signed)
Advanced Heart Failure Clinic Note   Referring Physician: PCP: [name], NP PCP-Cardiologist: Dorris Carnes, MD  [facility] Dr. Aundra Dubin   HPI: Mr.Williamsis a 53 yo with hx of diastolic CHF, HTN, DM, Atrial fib, PE in April 2020 (on Xarelto), OSA, GERD,chronic elevation of troponin.  Admitted 01/21/2019 with increased dyspnea and had PE. He was anticoagulated.   Patient re-admitted 5/20 for syncope/cough/dyspnea, COVID+, CT positive for bilateral GGOs, admitted to [facility] and discharged after 6 days, on O2. Rec'd Cards f/u.  Patient re-admitted 02/20/19 for bright red blood per rectum, eval'ed by GI, had EGD/colon, dx'd with hemorrhoidal bleeding, discharged after 9 days on O2.He was again Covid + .   Readmitted 03/11/19 with increased shortness of breath. He did not have home oxygen. He was discharged to home the next day.  He [facility] to [facility] ED[facility]23/20with lower extremity edema. COVID negative. Diuresed with lasix drip and transitioned to torsemide 40 mg twice daily. Had [place] with mild volume overload and preserved cardiac output. Discharge weight was 407.9 pounds.   He was readmitted with symptomatic anemia 05/28/19 with hgb 7.1. Received 1UPRBCs. GI consulted. He started on anusol and continued on stool softner.  He had f/u visit 10/20 and was complaining of increased leg edema and weight gain. SOB with exertion. DeniedPND. + Orthopnea. Nobleeding issues.Was eating high sodium food -->Bo jangles chicken. No fever or chills. Taking all medications. Followed closely by HF Paramedicine.He was given a dose of IV Lasix in clinic, 80 mg w/ >1300 urine output after IV lasix.His home torsemide dose was increased to 100 bid and he was instructed to f/u in 2 weeks.   He was readmitted to the hospital on 07/16/19 when he presented to the ED w/ worsening symptoms and was admitted by IM for a/c diastolic CHF and treated w/ IV Lasix. Had 2 day hospital stay and was discharged on 10/29.  Echo was done 10/28 and showed normal LVEF 65-70%, RV interpreted as normal but suspect significant RV dysfunction.  After diuresis w/ IV Lasix, he was placed back on home diuretic regimen, torsemide 100 mg bid. Lisinopril 5 mg was also added to regimen. AHF team was not consulted that admit.  He was seen back in clinic 07/30/19. Felt poorly. SOB at rest and worse w/ exertion. Also w/ orthopnea/PND. Significant wt gain of 20 lb up to 428 lb (dry wt ~407 lb), w/ marked abdominal distention and LEE and poor response to IV diuretics. He was directly admitted from clinic to [facility] for IV diuretics.  He was placed on IV Lasix 80 mg bid w/ great response. PICC line was placed to measure CVP. Net diuresis over 15 L. Diuresed down from admit wt of 427 lb to dry wt of 408 lb at discharge. CVP down to 8-9. He was transitioned back to PO torsemide 100 mg bid. He had slight bump in SCr to 1.2 and lisinopril was discontinued. Labetalol increased to 400 mg bid. Spironolactone was also added.   He was seen in the ER 11/24 with volume overload/weight gain.  He was enrolled in the FREEDOM trial and has been getting subcutaneous Lasix.    Today he returns for HF follow up Last week he was seen in the HF clinic and was given 4 more doses of [place] lasix.  Overall feeling a little better. Remains SOB with exertion. Denies PND/Orthopnea. Using Bipap every night.  Appetite ok. Tries to follow low salt diet but limited due to low income. He has been getting  food from shelters.  No fever or chills. Weight at home has gone down from 426--> 409 pounds. Taking all medications. Followed closely by HF Paramedicine.   Labs (11/20): BNP 32, K 3.6, creatinine 1.09 Labs 08/16/19: K 4.3 Creatinine 1.15 BNP 13.9  WBC 7  ECG (11/20): NSR, normal (personally reviewed).   Review of Systems: All systems reviewed and negative except as per HPI.   PMH: 1. Atrial fibrillation: Paroxysmal.  2. Pulmonary embolus: 5/20.  3. OHS/OSA: He is on home  oxygen during the day and uses CPAP at night.  4. Morbid obesity.  5. Chronic diastolic CHF:  - RHC (8/00): mean RA 12, PA 40/25 mean 31, mean PCWP 23, CI 2.47, PVR 1.1 WU.  - Echo (10/20): EF 65-70%, mild LVH, normal RV size and systolic function.  6. Type 2 diabetes 7. HTN 8. Rectal bleeding: ?Hemorrhoidal.  9. COVID-19 infection 7/20.   Current Outpatient Medications  Medication Sig Dispense Refill  . albuterol (VENTOLIN HFA) 108 (90 Base) MCG/ACT inhaler Inhale 2 puffs into the lungs every 6 (six) hours as needed for wheezing or shortness of breath. 18 g 1  . amLODipine (NORVASC) 10 MG tablet Take 1 tablet (10 mg total) by mouth daily. 90 tablet 1  . atorvastatin (LIPITOR) 20 MG tablet Take 1 tablet (20 mg total) by mouth at bedtime. 90 tablet 2  . Blood Glucose Monitoring Suppl (TRUE METRIX METER) w/Device KIT Use as instructed. Check blood glucose level by fingerstick twice per day. 1 kit 0  . carboxymethylcellulose (REFRESH PLUS) 0.5 % SOLN Place 1 drop into both eyes daily.     [place] Kitchen docusate sodium (COLACE) 100 MG capsule Take 1 capsule (100 mg total) by mouth 2 (two) times daily. 180 capsule 1  . ferrous sulfate 325 (65 FE) MG tablet Take 1 tablet (325 mg total) by mouth daily. Patient will pick up scripts today. 90 tablet 0  . glucose blood (TRUE METRIX BLOOD GLUCOSE TEST) test strip Use as instructed. Check blood glucose level by fingerstick twice per day. 100 each 12  . labetalol (NORMODYNE) 200 MG tablet Take 2 tablets (400 mg total) by mouth 2 (two) times daily. 120 tablet 3  . levalbuterol (XOPENEX HFA) 45 MCG/ACT inhaler Inhale 2 puffs into the lungs every 4 (four) hours as needed for wheezing.     . metFORMIN (GLUCOPHAGE) 500 MG tablet Take 1 tablet (500 mg total) by mouth 2 (two) times daily with a meal. Patient will pick up scripts today. 180 tablet 1  . metolazone (ZAROXOLYN) 2.5 MG tablet Take 1 tablet (2.5 mg total) by mouth once a week. Every Monday 10 tablet 3  . Misc.  Devices MISC Please provide Mr. Giammarco with insurance approved portable O2 concentrator ICD 10 J96.11 Z99.81 1 each 0  . Misc. Devices MISC Requires O2 @ 3L/min continuously via nasal canula and home fill system,. 1 each 0  . omeprazole (PRILOSEC) 20 MG capsule Take 1 capsule (20 mg total) by mouth daily. 30 capsule 3  . potassium chloride SA (KLOR-CON) 20 MEQ tablet Take 2 tablets (40 mEq total) by mouth 2 (two) times daily. Take an extra 2 tabs every Monday 360 tablet 3  . rivaroxaban (XARELTO) 20 MG TABS tablet Take 1 tablet (20 mg total) by mouth daily with supper. 60 tablet 6  . spironolactone (ALDACTONE) 25 MG tablet Take 1 tablet (25 mg total) by mouth daily. 30 tablet 3  . torsemide (DEMADEX) 100 MG tablet Take 1 tablet (  100 mg total) by mouth 2 (two) times daily. 180 tablet 3  . TRUEplus Lancets 28G MISC Use as instructed. Check blood glucose level by fingerstick twice per day. 200 each 3   No current facility-administered medications for this encounter.     No Known Allergies    Social History   Socioeconomic History  . Marital status: Single    Spouse name: Not on file  . Number of children: Not on file  . Years of education: Not on file  . Highest education level: Not on file  Occupational History  . Not on file  Social Needs  . Financial resource strain: Not on file  . Food insecurity    Worry: Not on file    Inability: Not on file  . Transportation needs    Medical: Not on file    Non-medical: Not on file  Tobacco Use  . Smoking status: Former Research scientist (life sciences)  . Smokeless tobacco: Never Used  Substance and Sexual Activity  . Alcohol use: Not Currently  . Drug use: Not Currently  . Sexual activity: Not Currently  Lifestyle  . Physical activity    Days per week: Not on file    Minutes per session: Not on file  . Stress: Not on file  Relationships  . Social Herbalist on phone: Not on file    Gets together: Not on file    Attends religious service: Not  on file    Active member of club or organization: Not on file    Attends meetings of clubs or organizations: Not on file    Relationship status: Not on file  . Intimate partner violence    Fear of current or ex partner: Not on file    Emotionally abused: Not on file    Physically abused: Not on file    Forced sexual activity: Not on file  Other Topics Concern  . Not on file  Social History Narrative  . Not on file      Family History  Problem Relation Age of Onset  . Hypertension Mother   . Diabetes Mother     There were no vitals filed for this visit.  Wt Readings from Last 3 Encounters:  08/19/19 (!) 184.2 kg (406 lb)  08/16/19 (!) 192.4 kg (424 lb 2 oz)  08/14/19 (!) 184.2 kg (406 lb)    PHYSICAL EXAM: General:  Well appearing. No resp difficulty HEENT: normal Neck: supple. JVP difficult to assess due to body habitus. Carotids 2+ bilat; no bruits. No lymphadenopathy or thryomegaly appreciated. Cor: PMI nondisplaced. Regular rate & rhythm. No rubs, gallops or murmurs. Lungs: clear Abdomen: obese, soft, nontender, nondistended. No hepatosplenomegaly. No bruits or masses. Good bowel sounds. Extremities: no cyanosis, clubbing, rash, R and LLE trace edema Neuro: alert & orientedx3, cranial nerves grossly intact. moves all 4 extremities w/o difficulty. Affect pleasant   ASSESSMENT & PLAN:  1. Chronic diastolic CHF: Echo in 11/17 with EF 65-70%, RV interpreted as normal but suspect significant RV dysfunction.  He has completed a 7 doses of [name] lasix.   - NYHA III.  Volume status stable today. Continue torsemide 100 mg bid + metolazone 2.5 mg qwk on Monday.  - Continue current dose of potassium  - Continue spironolactone 25 mg daily.  - Discussed low salt food choices and limiting fluid intake to < 2 liters per day.   2. Atrial fibrillation: Paroxysmal. Regular pulse.   - Continue Xarelto.  3. H/o PE:  5/20, continue Xarelto.  4. OHS/OSA: Continue CPAP at night and  oxygen during the day.   Start Date for FREEDOM HF Trial : 08/13/19   Any change in the diuretic regimen since last point of contact? On11/27 started on weekly metolazone.   Abdominal skin assessment is without evidence of skin reaction? No reaction Admitted to hospital since last ED visit? No  Admitted to hospital for heart failure related issues? NO  Evaluated in the emergency department for HF related issues? No  Office visit for HF related issues? No  Sought any additional or new medical treatment for HF related issues? No   Any problems with drug or device? No  NYHA Class III, chronic   Check CBC, BMET, Mag, BNP today.    Follow up in 3-4 weeks and plan to repeat CBC, BMET, Mag, BNP    Teneisha Gignac NP-C  08/22/2019 1:26 PM

## 2019-08-22 NOTE — Progress Notes (Signed)
Paramedicine Encounter   Patient ID: Benjamin [name] , male,   DOB: 02/06/1966,53 y.o.,  MRN: [id number]   Met patient in clinic today with provider.   B/p-178/88 p-85 sp02-98 Weight @ clinic-414 Weight @ [facility]-793  Pt in clinic today for f/u.  Pt reports feeling a lot better. He recently moved in his boarding house this week.  Pt denies sob. No dizziness, no c/p.   [name], [place] 08/22/2019

## 2019-08-22 NOTE — Progress Notes (Signed)
Pt called me and said his SS card came in the mail along with a letter from [facility] requesting that info. I p/u those papers from him and took them to [name] so she could handle that part and send that info to apt mgmt.  Took SS card back to him.   [name], EMT-Paramedic  08/22/19

## 2019-08-27 ENCOUNTER — Telehealth: Payer: Self-pay

## 2019-08-27 ENCOUNTER — Other Ambulatory Visit: Payer: Self-pay

## 2019-08-27 ENCOUNTER — Other Ambulatory Visit (HOSPITAL_COMMUNITY): Payer: Self-pay

## 2019-08-27 ENCOUNTER — Telehealth (HOSPITAL_COMMUNITY): Payer: Self-pay | Admitting: Licensed Clinical Social Worker

## 2019-08-27 MED ORDER — MISC. DEVICES MISC: 0 refills | Status: DC

## 2019-08-27 MED FILL — TRUEplus LANCETS 28G MISC: 50 days supply | Qty: 100 | Fill #1

## 2019-08-27 MED FILL — TRUE METRIX TEST STRIP: 50 days supply | Qty: 100 | Fill #1

## 2019-08-27 NOTE — Progress Notes (Signed)
Paramedicine Encounter    Patient ID: Benjamin [name], male    DOB: [birth year], 53 y.o.   MRN: [id number]   Patient Care Team: Benjamin Pounds, NP as PCP - General (Nurse Practitioner) Benjamin Records, MD as PCP - Cardiology (Cardiology) Benjamin Ny, LCSW as Social Worker (Licensed Clinical Social Worker)  Patient Active Problem List   Diagnosis Date Noted  . Acute on chronic diastolic CHF (congestive heart failure), NYHA class 3 ([place]) 07/30/2019  . Diabetes mellitus without complication ([place]) 81/85/6314  . Hypertensive emergency   . BRBPR (bright red blood per rectum) 06/12/2019  . Hypertension   . CHF (congestive heart failure) ([place])   . Sleep apnea   . Chronic respiratory failure ([place])   . Lower GI bleeding 06/11/2019  . Hypokalemia 05/29/2019  . GI bleed 05/29/2019  . Anemia 05/28/2019  . Acute on chronic diastolic CHF (congestive heart failure) ([place]) 04/11/2019  . Iron deficiency anemia 03/11/2019  . OSA on CPAP 03/11/2019  . HLD (hyperlipidemia) 03/11/2019  . Elevated troponin 03/11/2019  . GERD (gastroesophageal reflux disease) 03/11/2019  . Rectal bleeding 02/20/2019  . Dyspnea 02/06/2019  . COVID-19 virus infection 02/06/2019  . Bilateral lower extremity edema   . Morbid obesity with BMI of 50.0-59.9, adult ([place])   . Chronic diastolic CHF (congestive heart failure) ([place])   . PAF (paroxysmal atrial fibrillation) ([place])   . PE (pulmonary thromboembolism) ([place]) 01/21/2019  . Hypertensive urgency 01/21/2019  . Diabetes mellitus type 2 in obese ([place]) 01/21/2019    Current Outpatient Medications:  .  albuterol (VENTOLIN HFA) 108 (90 Base) MCG/ACT inhaler, Inhale 2 puffs into the lungs every 6 (six) hours as needed for wheezing or shortness of breath., Disp: 18 g, Rfl: 1 .  amLODipine (NORVASC) 10 MG tablet, Take 1 tablet (10 mg total) by mouth daily., Disp: 90 tablet, Rfl: 1 .  atorvastatin (LIPITOR) 20 MG tablet, Take 1 tablet (20 mg total) by mouth at bedtime.,  Disp: 90 tablet, Rfl: 2 .  Blood Glucose Monitoring Suppl (TRUE METRIX METER) w/Device KIT, Use as instructed. Check blood glucose level by fingerstick twice per day., Disp: 1 kit, Rfl: 0 .  carboxymethylcellulose (REFRESH PLUS) 0.5 % SOLN, Place 1 drop into both eyes daily. , Disp: , Rfl:  .  docusate sodium (COLACE) 100 MG capsule, Take 1 capsule (100 mg total) by mouth 2 (two) times daily., Disp: 180 capsule, Rfl: 1 .  ferrous sulfate 325 (65 FE) MG tablet, Take 1 tablet (325 mg total) by mouth daily. Patient will pick up scripts today., Disp: 90 tablet, Rfl: 0 .  glucose blood (TRUE METRIX BLOOD GLUCOSE TEST) test strip, Use as instructed. Check blood glucose level by fingerstick twice per day., Disp: 100 each, Rfl: 12 .  labetalol (NORMODYNE) 200 MG tablet, Take 2 tablets (400 mg total) by mouth 2 (two) times daily., Disp: 120 tablet, Rfl: 3 .  metFORMIN (GLUCOPHAGE) 500 MG tablet, Take 1 tablet (500 mg total) by mouth 2 (two) times daily with a meal. Patient will pick up scripts today., Disp: 180 tablet, Rfl: 1 .  metolazone (ZAROXOLYN) 2.5 MG tablet, Take 1 tablet (2.5 mg total) by mouth once a week. Every Monday, Disp: 10 tablet, Rfl: 3 .  Misc. Devices MISC, Requires O2 @ 3L/min continuously via nasal canula and home fill system,., Disp: 1 each, Rfl: 0 .  omeprazole (PRILOSEC) 20 MG capsule, Take 1 capsule (20 mg total) by mouth daily., Disp: 30 capsule, Rfl: 3 .  potassium chloride SA (KLOR-CON) 20 MEQ tablet, Take 2 tablets (40 mEq total) by mouth 2 (two) times daily. Take an extra 2 tabs every Monday, Disp: 360 tablet, Rfl: 3 .  rivaroxaban (XARELTO) 20 MG TABS tablet, Take 1 tablet (20 mg total) by mouth daily with supper., Disp: 60 tablet, Rfl: 6 .  spironolactone (ALDACTONE) 25 MG tablet, Take 1 tablet (25 mg total) by mouth daily. (Patient taking differently: Take 25 mg by mouth at bedtime. ), Disp: 30 tablet, Rfl: 3 .  torsemide (DEMADEX) 100 MG tablet, Take 1 tablet (100 mg total) by  mouth 2 (two) times daily., Disp: 180 tablet, Rfl: 3 .  TRUEplus Lancets 28G MISC, Use as instructed. Check blood glucose level by fingerstick twice per day., Disp: 200 each, Rfl: 3 .  levalbuterol (XOPENEX HFA) 45 MCG/ACT inhaler, Inhale 2 puffs into the lungs every 4 (four) hours as needed for wheezing. , Disp: , Rfl:  .  Misc. Devices MISC, Please provide [name] with insurance approved portable O2 concentrator ICD 10 J96.11 [id number] (Patient not taking: Reported on 08/27/2019), Disp: 1 each, Rfl: 0 No Known Allergies    Social History   Socioeconomic History  . Marital status: Single    Spouse name: Not on file  . Number of children: Not on file  . Years of education: Not on file  . Highest education level: Not on file  Occupational History  . Not on file  Social Needs  . Financial resource strain: Not on file  . Food insecurity    Worry: Not on file    Inability: Not on file  . Transportation needs    Medical: Not on file    Non-medical: Not on file  Tobacco Use  . Smoking status: Former Research scientist (life sciences)  . Smokeless tobacco: Never Used  Substance and Sexual Activity  . Alcohol use: Not Currently  . Drug use: Not Currently  . Sexual activity: Not Currently  Lifestyle  . Physical activity    Days per week: Not on file    Minutes per session: Not on file  . Stress: Not on file  Relationships  . Social Herbalist on phone: Not on file    Gets together: Not on file    Attends religious service: Not on file    Active member of club or organization: Not on file    Attends meetings of clubs or organizations: Not on file    Relationship status: Not on file  . Intimate partner violence    Fear of current or ex partner: Not on file    Emotionally abused: Not on file    Physically abused: Not on file    Forced sexual activity: Not on file  Other Topics Concern  . Not on file  Social History Narrative  . Not on file    Physical Exam      Future Appointments   Date Time Provider [place]  09/10/2019 10:00 AM [facility] PA/NP [facility] None  09/30/2019  1:30 PM Benjamin Pounds, NP [facility] None    BP (!) 200/120   Pulse 80   Resp 18   Wt (!) 409 lb (185.5 kg)   SpO2 94%   BMI 57.04 kg/m   CBG EMS-116  Weight yesterday-411 Last visit weight-414 @ clinic   Pt reports feeling ok. His g/f is there with him this morning.  He denies increased sob, no dizziness, no c/p. No increased edema noted. Weight maintaining good. He  is speaking full sentences.  However he is falling asleep mid-sentence siting up in chair. I asked him if he has been using his CPAP and he states he has not used it in at least 5 nights and he said it was due to him not having a table by his bed to set it up on. He said he was going to be going somewhere to find a table to use.  [name] was not able to make contact with pt to drop off the spiro-I will p/u and drop off. He also needs test strips and lancets--[facility] not carry the brand that he uses so that [facility] have [facility].  He also needs his inhaler so I asked [facility] to fill that and he will call [facility] to get it sent to him so it can be filled.  --I went to [facility] and p/u spiro and brought back to him.  Called [facility] to get the refills of his lancets and test strips. Will p/u tomor.   [name], [place] Paramedic  08/27/19

## 2019-08-27 NOTE — Telephone Encounter (Signed)
Call placed to patient regarding POC.  He said that he is in his new apartment now and would like to have a POC. Informed him that the order will be sent to [place] to review.

## 2019-08-27 NOTE — Telephone Encounter (Signed)
Call received from [place].  She received the POC order and O2 sats. Visit notes are needed. Patient has not been seen in > 30 days at [facility]. She said that a telelvisit with a provider in the clinic is acceptable.  It does not have to be the provider who wrote the order that completes the encounter with the patient. The notes need to address the need for POC and reason to change provider to [place]. [facility] has not had POCs available.   Call placed to [name], LCSW/Heart Failure clinic . Explained to her the need for televisit with patient.  Dr Joya Gaskins has agreed to see patient tomorrow at 16.  [name] to contact [name], EMT about the appt and request she is with the patient at that time to assist with the call.   Call placed to patient and explained above.He is in agreement to the televisit and assist from [place]. He understands that the visit is with Dr Joya Gaskins, not his PCP.

## 2019-08-27 NOTE — Telephone Encounter (Signed)
CSW informed by patient that he received notice from [facility] that he was overpaid on this SSI benefits in the amount of $3,132 which is all he has been paid so far excluding the payment for December.  Patient is unable to pay this money back as he has used it towards his security desposit/first months rent/rental application fees/etc.  CSW called [employer] and spoke with representative regarding this concern- CSW informed how to submit a waiver request to see if this overpayment request could be over looked- will submit paperwork showing that the overpayment was not the patients fault and that [employer] was aware the patient was staying in a federally funded facility during the time of the application and of his payments.  CSW filled out waiver application and sent to [employer] who will get the patient to sign and then return to [place] who will fax it in to [facility] (fax#: [phone])  CSW will continue to follow and assist as needed  [name], [facility] Desk#: [phone] Cell#: [phone]

## 2019-08-28 ENCOUNTER — Encounter: Payer: Self-pay | Admitting: Critical Care Medicine

## 2019-08-28 ENCOUNTER — Other Ambulatory Visit (HOSPITAL_COMMUNITY): Payer: Self-pay

## 2019-08-28 ENCOUNTER — Other Ambulatory Visit: Payer: Self-pay | Admitting: Nurse Practitioner

## 2019-08-28 ENCOUNTER — Other Ambulatory Visit: Payer: Self-pay

## 2019-08-28 ENCOUNTER — Ambulatory Visit: Payer: Medicaid Other | Attending: Critical Care Medicine | Admitting: Critical Care Medicine

## 2019-08-28 ENCOUNTER — Telehealth (HOSPITAL_COMMUNITY): Payer: Self-pay | Admitting: Licensed Clinical Social Worker

## 2019-08-28 ENCOUNTER — Telehealth: Payer: Self-pay

## 2019-08-28 DIAGNOSIS — I509 Heart failure, unspecified: Secondary | ICD-10-CM | POA: Diagnosis not present

## 2019-08-28 DIAGNOSIS — Z9989 Dependence on other enabling machines and devices: Secondary | ICD-10-CM

## 2019-08-28 DIAGNOSIS — I2699 Other pulmonary embolism without acute cor pulmonale: Secondary | ICD-10-CM

## 2019-08-28 DIAGNOSIS — J9611 Chronic respiratory failure with hypoxia: Secondary | ICD-10-CM | POA: Diagnosis not present

## 2019-08-28 DIAGNOSIS — G4733 Obstructive sleep apnea (adult) (pediatric): Secondary | ICD-10-CM

## 2019-08-28 DIAGNOSIS — Z6841 Body Mass Index (BMI) 40.0 and over, adult: Secondary | ICD-10-CM

## 2019-08-28 MED ORDER — ALBUTEROL SULFATE HFA 108 (90 BASE) MCG/ACT IN AERS: 2.0000 | INHALATION_SPRAY | Freq: Four times a day (QID) | RESPIRATORY_TRACT | 2 refills | Status: DC | PRN

## 2019-08-28 NOTE — Telephone Encounter (Signed)
Provider notes for POC faxed to [facility]

## 2019-08-28 NOTE — Progress Notes (Signed)
Paramedicine Encounter    Patient ID: RHYTHM [name], male    DOB: 07-26-1966, 53 y.o.   MRN: [id number]   Patient Care Team: Benjamin Pounds, NP as PCP - General (Nurse Practitioner) Benjamin Records, MD as PCP - Cardiology (Cardiology) Benjamin Ny, LCSW as Social Worker (Licensed Clinical Social Worker)  Patient Active Problem List   Diagnosis Date Noted  . Acute on chronic diastolic CHF (congestive heart failure), NYHA class 3 ([place]) 07/30/2019  . Diabetes mellitus without complication ([place]) 03/30/1974  . Hypertensive emergency   . BRBPR (bright red blood per rectum) 06/12/2019  . Hypertension   . CHF (congestive heart failure) ([place])   . Sleep apnea   . Chronic respiratory failure ([place])   . Lower GI bleeding 06/11/2019  . Hypokalemia 05/29/2019  . GI bleed 05/29/2019  . Anemia 05/28/2019  . Acute on chronic diastolic CHF (congestive heart failure) ([place]) 04/11/2019  . Iron deficiency anemia 03/11/2019  . OSA on CPAP 03/11/2019  . HLD (hyperlipidemia) 03/11/2019  . Elevated troponin 03/11/2019  . GERD (gastroesophageal reflux disease) 03/11/2019  . Rectal bleeding 02/20/2019  . Dyspnea 02/06/2019  . COVID-19 virus infection 02/06/2019  . Bilateral lower extremity edema   . Morbid obesity with BMI of 50.0-59.9, adult ([place])   . Chronic diastolic CHF (congestive heart failure) ([place])   . PAF (paroxysmal atrial fibrillation) ([place])   . PE (pulmonary thromboembolism) ([place]) 01/21/2019  . Hypertensive urgency 01/21/2019  . Diabetes mellitus type 2 in obese ([place]) 01/21/2019    Current Outpatient Medications:  .  albuterol (VENTOLIN HFA) 108 (90 Base) MCG/ACT inhaler, Inhale 2 puffs into the lungs every 6 (six) hours as needed for wheezing or shortness of breath., Disp: 18 g, Rfl: 1 .  amLODipine (NORVASC) 10 MG tablet, Take 1 tablet (10 mg total) by mouth daily., Disp: 90 tablet, Rfl: 1 .  atorvastatin (LIPITOR) 20 MG tablet, Take 1 tablet (20 mg total) by mouth at bedtime.,  Disp: 90 tablet, Rfl: 2 .  Blood Glucose Monitoring Suppl (TRUE METRIX METER) w/Device KIT, Use as instructed. Check blood glucose level by fingerstick twice per day., Disp: 1 kit, Rfl: 0 .  carboxymethylcellulose (REFRESH PLUS) 0.5 % SOLN, Place 1 drop into both eyes daily. , Disp: , Rfl:  .  docusate sodium (COLACE) 100 MG capsule, Take 1 capsule (100 mg total) by mouth 2 (two) times daily., Disp: 180 capsule, Rfl: 1 .  ferrous sulfate 325 (65 FE) MG tablet, Take 1 tablet (325 mg total) by mouth daily. Patient will pick up scripts today., Disp: 90 tablet, Rfl: 0 .  glucose blood (TRUE METRIX BLOOD GLUCOSE TEST) test strip, Use as instructed. Check blood glucose level by fingerstick twice per day., Disp: 100 each, Rfl: 12 .  labetalol (NORMODYNE) 200 MG tablet, Take 2 tablets (400 mg total) by mouth 2 (two) times daily., Disp: 120 tablet, Rfl: 3 .  levalbuterol (XOPENEX HFA) 45 MCG/ACT inhaler, Inhale 2 puffs into the lungs every 4 (four) hours as needed for wheezing. , Disp: , Rfl:  .  metFORMIN (GLUCOPHAGE) 500 MG tablet, Take 1 tablet (500 mg total) by mouth 2 (two) times daily with a meal. Patient will pick up scripts today., Disp: 180 tablet, Rfl: 1 .  metolazone (ZAROXOLYN) 2.5 MG tablet, Take 1 tablet (2.5 mg total) by mouth once a week. Every Monday, Disp: 10 tablet, Rfl: 3 .  Misc. Devices MISC, Requires O2 @ 3L/min continuously via nasal canula and home fill  system,., Disp: 1 each, Rfl: 0 .  Misc. Devices MISC, Please provide Benjamin [name] with insurance approved portable O2 concentrator ICD 10 J96.11 Z99.81, Disp: 1 each, Rfl: 0 .  omeprazole (PRILOSEC) 20 MG capsule, Take 1 capsule (20 mg total) by mouth daily., Disp: 30 capsule, Rfl: 3 .  potassium chloride SA (KLOR-CON) 20 MEQ tablet, Take 2 tablets (40 mEq total) by mouth 2 (two) times daily. Take an extra 2 tabs every Monday, Disp: 360 tablet, Rfl: 3 .  rivaroxaban (XARELTO) 20 MG TABS tablet, Take 1 tablet (20 mg total) by mouth daily  with supper., Disp: 60 tablet, Rfl: 6 .  spironolactone (ALDACTONE) 25 MG tablet, Take 1 tablet (25 mg total) by mouth daily. (Patient taking differently: Take 25 mg by mouth at bedtime. ), Disp: 30 tablet, Rfl: 3 .  torsemide (DEMADEX) 100 MG tablet, Take 1 tablet (100 mg total) by mouth 2 (two) times daily., Disp: 180 tablet, Rfl: 3 .  TRUEplus Lancets 28G MISC, Use as instructed. Check blood glucose level by fingerstick twice per day., Disp: 200 each, Rfl: 3 No Known Allergies    Social History   Socioeconomic History  . Marital status: Single    Spouse name: Not on file  . Number of children: Not on file  . Years of education: Not on file  . Highest education level: Not on file  Occupational History  . Not on file  Social Needs  . Financial resource strain: Not on file  . Food insecurity    Worry: Not on file    Inability: Not on file  . Transportation needs    Medical: Not on file    Non-medical: Not on file  Tobacco Use  . Smoking status: Former Research scientist (life sciences)  . Smokeless tobacco: Never Used  Substance and Sexual Activity  . Alcohol use: Not Currently  . Drug use: Not Currently  . Sexual activity: Not Currently  Lifestyle  . Physical activity    Days per week: Not on file    Minutes per session: Not on file  . Stress: Not on file  Relationships  . Social Herbalist on phone: Not on file    Gets together: Not on file    Attends religious service: Not on file    Active member of club or organization: Not on file    Attends meetings of clubs or organizations: Not on file    Relationship status: Not on file  . Intimate partner violence    Fear of current or ex partner: Not on file    Emotionally abused: Not on file    Physically abused: Not on file    Forced sexual activity: Not on file  Other Topics Concern  . Not on file  Social History Narrative  . Not on file    Physical Exam      Future Appointments  Date Time Provider [place]   09/10/2019 10:00 AM [facility] PA/NP [facility] None  09/30/2019  1:30 PM Benjamin Pounds, NP [facility] None    BP (!) 170/104   Pulse 92   Resp 20   SpO2 93%   Weight yesterday-409 Last visit weight-409  He has not taken his meds yet this morning. He just woke up shortly before I arrived. B/p up today-no meds this morning.  CHW wanted to do virtual visit to assess his 02 portable concentrator.   [name], [facility] Paramedic  08/28/19

## 2019-08-28 NOTE — Progress Notes (Signed)
Patient ID: Benjamin [name], male   DOB: 1966/08/22, 53 y.o.   MRN: [id number] Virtual Visit via Telephone Note  I connected with Benjamin [name] on 08/28/19 at 11:00 AM EST by telephone and verified that I am speaking with the correct person using two identifiers.   Consent:  I discussed the limitations, risks, security and privacy concerns of performing an evaluation and management service by telephone and the availability of in person appointments. I also discussed with the patient that there may be a patient responsible charge related to this service. The patient expressed understanding and agreed to proceed.  Location of patient: The patient was at home  Location of provider: I was in my office  Persons participating in the televisit with the patient.   Katie  para medicine staff member was with the patient    History of Present Illness: This is a 53 year old male with history of previous pulmonary embolism, diastolic heart failure, atrial fibrillation, hypertension, severe sleep apnea, and chronic respiratory failure with morbid obesity and associated diabetes.  The patient's BMI is 57.  The patient had been previously admitted for decompensation of his pulmonary status and was placed on as needed albuterol inhaler.  Note the patient is on CPAP for sleep apnea.  The patient has been prescribed after the recent hospitalization ambulatory oxygen.  Note on room air at rest is 92% however when he ambulates just a few feet he drops to 86%.  After application of 2 L he easily goes above 90%.  The patient does have a home concentrator however he is using the cylinders and they are difficult to navigate.  He is applying for portable oxygen concentrator.  He states many times he will be at 3 L at home and then use 3 to 4 L with exertion with his ED cylinders.  The patient currently is stable at baseline with his dyspnea.  He is not currently exacerbating.  His primary cause of hypoxemia is  secondary to chronic pulmonary embolic disease, morbid obesity, sleep apnea, diastolic heart failure, and mild degree of pulmonary hypertension.  The saturation qualification study is reviewed from August 01, 2019 and documents saturation of 92% at rest on room air 86% while ambulating on room air and on 2 L while ambulating 96%.   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 The patient was provided an opportunity to ask questions and all were answered. The patient agreed with the plan and demonstrated an understanding of the instructions.   The patient was advised to call back or seek an in-person evaluation if the symptoms worsen or if the condition fails to improve as anticipated.  I provided 20 minutes of non-face-to-face time during this encounter  including  median intraservice time , review of notes, labs, imaging, medications  and explaining diagnosis and management to the patient .  Asencion Noble, MD

## 2019-08-28 NOTE — Telephone Encounter (Signed)
CSW completed [employer]'s waiver for overpayment request alongside patient and faxed into [employer] for review- representative that CSW discussed with yesterday stated determination would likely be made in 3-4 weeks once documentation is received.  CSW will continue to follow and assist as needed  Benjamin [name], [facility] Desk#: [phone] Cell#: [phone]

## 2019-08-29 ENCOUNTER — Telehealth: Payer: Self-pay

## 2019-08-29 NOTE — Telephone Encounter (Signed)
Call placed to [facility] to confirm receipt of the provider's visit notes.    Estill Bamberg said that she faxed another order that needs provider's signature. It needs to be signed and returned by 07/31/2019. Informed her that the PCP would be in the office tomorrow to sign  - 08/30/2019 and she said that was fine and the orders can be faxed back then.  If not returned by then, new O2 sats will be needed.

## 2019-08-30 ENCOUNTER — Telehealth (HOSPITAL_COMMUNITY): Payer: Self-pay | Admitting: Licensed Clinical Social Worker

## 2019-08-30 ENCOUNTER — Telehealth (HOSPITAL_COMMUNITY): Payer: Self-pay | Admitting: *Deleted

## 2019-08-30 NOTE — Telephone Encounter (Addendum)
Previous Weeks:  - He wants to lose 10 lbs by the end of January  o Download app on phone to track distance and time walked at track in neighborhood o Walk track 4 days out of the next 7 days  o Watching sodium in food content and no fast food   Progress Note: - Today's weight: 396 lbs - He has been walking back and forth between [place] and other stores and says that is all of the activity in which he has engaged. - He has been so busy and that he has has not taken his intentional walks at the track as planned.  - He has downloaded an app for his phone to track his exercise and physical activity  - He wants to return to his exercise and plans to use his app on his phone to track and report with CEP. - He has a lady friend that is going to help him use the app and if he needs more help we will meet at his next clinic visit to review/educate. - He has been visiting friends' kids and playing outside with them and getting physical activity that way.   Achievements: - He has lost a few more pounds and feels proud of himself - He has not been going to [employer] (he drove past three times and did not cave!)  - His family says he has been friendlier and happier  - He hasn't been as "grumpy" and "moody"    Challenges/Frustrations: - This has been mostly only minimal everyday things  - More internet banking theft activity he is trying to sort out  - Looking for new housing  New Weekly Goal (s): - He wants to lose 10 lbs by the end of January  o Download app on phone to track distance and time walked at track in neighborhood o Screen shot exercise summary and send picture to CSW  o Walk track 4 days out of the next 7 days  o Watching sodium in food content and no fast food     Next Session: Wednesday 12/16 via telephone     Landis Martins, MS, ACSM-RCEP Clinical Exercise Physiologist

## 2019-08-30 NOTE — Telephone Encounter (Signed)
CSW called [employer] to confirm they received waiver request- they confirmed they had received it and would be processing it but it could take 30-60 days to get back to him regarding this waiver.    CSW will continue to follow and assist as needed  [name], [facility] Desk#: [phone] Cell#: [phone]

## 2019-08-30 NOTE — Telephone Encounter (Addendum)
Signed orders for POC faxed to [facility].  Cal placed to [facility] to confirm receipt of the orders. Message left requesting a call back from [place].  Call received from [place] who confirmed that she received the orders. No additional information needed and she is able to start processing the order

## 2019-09-02 ENCOUNTER — Telehealth: Payer: Self-pay | Admitting: *Deleted

## 2019-09-02 DIAGNOSIS — Z006 Encounter for examination for normal comparison and control in clinical research program: Secondary | ICD-10-CM

## 2019-09-02 NOTE — Telephone Encounter (Signed)
Freedom Day 14-21 phone call visit    Date of Phone Call (DD/MON/YYYY):  14/DEC/2020   Phone Call not performed: [] YES      Did the subject experience any problems with drug or device use since last point of contact? [] YES   [x]  NO   Has the subject been prescribed with an additional dose of treatment since last point of contact ? [] YES   [x]  NO   Weight: 402   Unit: []  Kilograms  [x] Pounds   Weight not done: []    Have you experienced any shortness of breath? [x] YES   []  NO  If YES>>  [] ? At rest  [x] ?  Worse with activity   [] ? when you lie down at nigh([] ? Same[] ?  Worse [] ? Better )   Have you noticed swelling of your ankles/legs or abdomen? [] YES   [x]  NO   Last night, did you have difficulty sleeping? [] YES   [x]  NO   Did you have any other symptoms from your heart failure that is different than the previous day?  [] YES   [x]  NO   Did the subject start any new medications/supplements or have any medications /supplements change since last point of contact? [] YES   [x]  NO   Did the subject have any hospital visits, emergency department visits or unscheduled clinic visits since last point of contact? [] YES   [x]  NO   Did the subject experience any Adverse Event since last point of contact? [] YES   [x]  NO   Did the subject experience any problems with drug or device use since last point of contact? [] YES   [x]  NO   Has the subject been prescribed with an additional dose of treatment since last point of contact? [] YES   [x]  NO    Spoke with pt via phone for Freedom Day 14-21 visit. Pt doing well with no complaints. No AE/SAE or medication changes to report. Will see pt in clinic on Dec 22,2020 @ 10:00am.

## 2019-09-03 ENCOUNTER — Telehealth (HOSPITAL_COMMUNITY): Payer: Self-pay | Admitting: *Deleted

## 2019-09-03 ENCOUNTER — Other Ambulatory Visit (HOSPITAL_COMMUNITY): Payer: Self-pay

## 2019-09-03 MED ORDER — METOLAZONE 5 MG PO TABS: ORAL_TABLET | ORAL | 3 refills | Status: DC

## 2019-09-03 NOTE — Progress Notes (Signed)
Paramedicine Encounter    Patient ID: Benjamin [name], male    DOB: [birth year], 53 y.o.   MRN: [id number]   Patient Care Team: [name], NP as PCP - General (Nurse Practitioner) [name], MD as PCP - Cardiology (Cardiology) [name], LCSW as Social Worker (Licensed Clinical Social Worker)  Patient Active Problem List   Diagnosis Date Noted  . Acute on chronic diastolic CHF (congestive heart failure), NYHA class 3 ([place]) 07/30/2019  . Diabetes mellitus without complication ([place]) 56/81/2751  . Hypertensive emergency   . BRBPR (bright red blood per rectum) 06/12/2019  . Hypertension   . CHF (congestive heart failure) ([place])   . Sleep apnea   . Chronic respiratory failure ([place])   . Lower GI bleeding 06/11/2019  . Hypokalemia 05/29/2019  . GI bleed 05/29/2019  . Anemia 05/28/2019  . Acute on chronic diastolic CHF (congestive heart failure) ([place]) 04/11/2019  . Iron deficiency anemia 03/11/2019  . OSA on CPAP 03/11/2019  . HLD (hyperlipidemia) 03/11/2019  . Elevated troponin 03/11/2019  . GERD (gastroesophageal reflux disease) 03/11/2019  . Rectal bleeding 02/20/2019  . Dyspnea 02/06/2019  . COVID-19 virus infection 02/06/2019  . Bilateral lower extremity edema   . Morbid obesity with BMI of 50.0-59.9, adult ([place])   . Chronic diastolic CHF (congestive heart failure) ([place])   . PAF (paroxysmal atrial fibrillation) ([place])   . PE (pulmonary thromboembolism) ([place]) 01/21/2019  . Hypertensive urgency 01/21/2019  . Diabetes mellitus type 2 in obese ([place]) 01/21/2019    Current Outpatient Medications:  .  albuterol (VENTOLIN HFA) 108 (90 Base) MCG/ACT inhaler, Inhale 2 puffs into the lungs every 6 (six) hours as needed for wheezing or shortness of breath., Disp: 8.5 g, Rfl: 2 .  Blood Glucose Monitoring Suppl (TRUE METRIX METER) w/Device KIT, Use as instructed. Check blood glucose level by fingerstick twice per day., Disp: 1 kit, Rfl: 0 .  carboxymethylcellulose (REFRESH  PLUS) 0.5 % SOLN, Place 1 drop into both eyes daily. , Disp: , Rfl:  .  docusate sodium (COLACE) 100 MG capsule, Take 1 capsule (100 mg total) by mouth 2 (two) times daily., Disp: 180 capsule, Rfl: 1 .  ferrous sulfate 325 (65 FE) MG tablet, Take 1 tablet (325 mg total) by mouth daily. Patient will pick up scripts today., Disp: 90 tablet, Rfl: 0 .  glucose blood (TRUE METRIX BLOOD GLUCOSE TEST) test strip, Use as instructed. Check blood glucose level by fingerstick twice per day., Disp: 100 each, Rfl: 12 .  labetalol (NORMODYNE) 200 MG tablet, Take 2 tablets (400 mg total) by mouth 2 (two) times daily., Disp: 120 tablet, Rfl: 3 .  metFORMIN (GLUCOPHAGE) 500 MG tablet, Take 1 tablet (500 mg total) by mouth 2 (two) times daily with a meal. Patient will pick up scripts today., Disp: 180 tablet, Rfl: 1 .  Misc. Devices MISC, Requires O2 @ 3L/min continuously via nasal canula and home fill system,., Disp: 1 each, Rfl: 0 .  Misc. Devices MISC, Please provide Mr. Urizar with insurance approved portable O2 concentrator ICD 10 J96.11 Z99.81, Disp: 1 each, Rfl: 0 .  omeprazole (PRILOSEC) 20 MG capsule, Take 1 capsule (20 mg total) by mouth daily., Disp: 30 capsule, Rfl: 3 .  potassium chloride SA (KLOR-CON) 20 MEQ tablet, Take 2 tablets (40 mEq total) by mouth 2 (two) times daily. Take an extra 2 tabs every Monday, Disp: 360 tablet, Rfl: 3 .  rivaroxaban (XARELTO) 20 MG TABS tablet, Take 1 tablet (  20 mg total) by mouth daily with supper., Disp: 60 tablet, Rfl: 6 .  spironolactone (ALDACTONE) 25 MG tablet, Take 1 tablet (25 mg total) by mouth daily. (Patient taking differently: Take 25 mg by mouth at bedtime. ), Disp: 30 tablet, Rfl: 3 .  torsemide (DEMADEX) 100 MG tablet, Take 1 tablet (100 mg total) by mouth 2 (two) times daily., Disp: 180 tablet, Rfl: 3 .  TRUEplus Lancets 28G MISC, Use as instructed. Check blood glucose level by fingerstick twice per day., Disp: 200 each, Rfl: 3 .  amLODipine (NORVASC) 10  MG tablet, Take 1 tablet (10 mg total) by mouth daily., Disp: 90 tablet, Rfl: 1 .  atorvastatin (LIPITOR) 20 MG tablet, Take 1 tablet (20 mg total) by mouth at bedtime., Disp: 90 tablet, Rfl: 2 .  levalbuterol (XOPENEX HFA) 45 MCG/ACT inhaler, Inhale 2 puffs into the lungs every 4 (four) hours as needed for wheezing. , Disp: , Rfl:  .  metolazone (ZAROXOLYN) 5 MG tablet, Take 1 tablet every Monday, Wednesday, and Friday., Disp: 15 tablet, Rfl: 3 No Known Allergies    Social History   Socioeconomic History  . Marital status: Single    Spouse name: Not on file  . Number of children: Not on file  . Years of education: Not on file  . Highest education level: Not on file  Occupational History  . Not on file  Tobacco Use  . Smoking status: Former Research scientist (life sciences)  . Smokeless tobacco: Never Used  Substance and Sexual Activity  . Alcohol use: Not Currently  . Drug use: Not Currently  . Sexual activity: Not Currently  Other Topics Concern  . Not on file  Social History Narrative  . Not on file   Social Determinants of Health   Financial Resource Strain:   . Difficulty of Paying Living Expenses: Not on file  Food Insecurity:   . Worried About Charity fundraiser in the Last Year: Not on file  . Ran Out of Food in the Last Year: Not on file  Transportation Needs:   . Lack of Transportation (Medical): Not on file  . Lack of Transportation (Non-Medical): Not on file  Physical Activity:   . Days of Exercise per Week: Not on file  . Minutes of Exercise per Session: Not on file  Stress:   . Feeling of Stress : Not on file  Social Connections:   . Frequency of Communication with Friends and Family: Not on file  . Frequency of Social Gatherings with Friends and Family: Not on file  . Attends Religious Services: Not on file  . Active Member of Clubs or Organizations: Not on file  . Attends Archivist Meetings: Not on file  . Marital Status: Not on file  Intimate Partner Violence:    . Fear of Current or Ex-Partner: Not on file  . Emotionally Abused: Not on file  . Physically Abused: Not on file  . Sexually Abused: Not on file    Physical Exam      Future Appointments  Date Time Provider [place]  09/10/2019 10:00 AM [facility] PA/NP [facility] None  09/30/2019  1:30 PM [name], NP [facility] None    BP (!) 190/114   Pulse 90   Resp 18   Wt (!) 415 lb (188.2 kg)   SpO2 96%   BMI 57.88 kg/m   Weight yesterday-407 Last visit weight-409  Pt repots doing good. He got his portable 02 bag and his supplies from  [facility].  Pt denies sob. He does have swelling to his feet--he fell asleep sitting up in the chair.  meds verified and pill box refilled.  Weight is up 8 lbs  Need refilled- xarelto, potassium, torsemide--called that in to pharmacy.  I called clinic about pts weight increase--per amy he is to take 51m metolazone today with 414m of potassium and to take 90m28metolazone mon/wed/fri with the 24m51motassium.  Pt states his breathing is doing fine. Had not taken his meds yet this morning due to him just waking up when I arrived.   I will go by to p/u pts refills and place in pill box that is needed.   KatiMarylouise StacksT-[facility] Center[facility]  09/03/19

## 2019-09-03 NOTE — Telephone Encounter (Signed)
Katie w/paramedicine called to report patients weight up 8lbs overnight, BLEE, and decreased urine output. Per Darrick Grinder, NP increase metolazone to 5mg  every M,W,F with 76meq of K and take 5mg  of metolazone today with 22meq of K. Katie aware.

## 2019-09-04 ENCOUNTER — Other Ambulatory Visit (HOSPITAL_COMMUNITY): Payer: Self-pay

## 2019-09-04 ENCOUNTER — Telehealth (HOSPITAL_COMMUNITY): Payer: Self-pay | Admitting: *Deleted

## 2019-09-04 NOTE — Progress Notes (Signed)
Med p/u from pharmacy and to place rest of metolazone/potassium doses in pill box.  Great urine output and feels good.  Weight down to 409 this morning.   [name], EMT-Paramedic  09/04/19

## 2019-09-04 NOTE — Telephone Encounter (Signed)
Called for weekly follow-up for planned Exercise and Wellness coaching appointment. No answer and voicemail full with 2x attempts today. Will try again another day this week.     Landis Martins, MS, ACSM-RCEP Clinical Exercise Physiologist/ Health and Wellness Coach

## 2019-09-06 ENCOUNTER — Telehealth (HOSPITAL_COMMUNITY): Payer: Self-pay | Admitting: *Deleted

## 2019-09-06 NOTE — Telephone Encounter (Signed)
After 2 failed attempts to reach him on Wednesday, I attempted to contact [name] for Exercise and Wellness weekly follow-up.alled again with no answer and a full voicemail box. Will try again next week.    Benjamin Martins, Benjamin [name], ACSM-RCEP Clinical Exercise Physiologist/ Health and Wellness Coach

## 2019-09-09 ENCOUNTER — Other Ambulatory Visit (HOSPITAL_COMMUNITY): Payer: Self-pay

## 2019-09-09 ENCOUNTER — Telehealth (HOSPITAL_COMMUNITY): Payer: Self-pay | Admitting: Licensed Clinical Social Worker

## 2019-09-09 NOTE — Telephone Encounter (Signed)
CSW received call from pt to inquire about appt for tomorrow.  States he had thought he had an appt but that his ride had been canceled for it so he wasn't sure anymore.  CSW saw that he had an appt for tomorrow but it had been canceled- pt reports he was never informed of cancellation.  CSW inquired with clinic RN who states that it appears as if pt canceled it when he received his reminder call.  RN able to reschedule it and request taxi for appt.  CSW will continue to follow and assist as needed  Benjamin [name], [facility] Desk#: [phone] Cell#: [phone]
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
## 2019-09-10 ENCOUNTER — Other Ambulatory Visit (HOSPITAL_COMMUNITY): Payer: Self-pay

## 2019-09-10 ENCOUNTER — Encounter (HOSPITAL_COMMUNITY): Payer: Self-pay

## 2019-09-10 ENCOUNTER — Ambulatory Visit (HOSPITAL_COMMUNITY)
Admission: RE | Admit: 2019-09-10 | Discharge: 2019-09-10 | Disposition: A | Discharge status: Home / Self Care | Payer: Medicaid Other | Source: Ambulatory Visit | Attending: Internal Medicine | Admitting: Internal Medicine

## 2019-09-10 ENCOUNTER — Other Ambulatory Visit: Payer: Self-pay

## 2019-09-10 ENCOUNTER — Telehealth (HOSPITAL_COMMUNITY): Payer: Self-pay

## 2019-09-10 ENCOUNTER — Telehealth (HOSPITAL_COMMUNITY): Payer: Self-pay | Admitting: *Deleted

## 2019-09-10 ENCOUNTER — Encounter (HOSPITAL_COMMUNITY): Payer: Medicaid Other

## 2019-09-10 VITALS — BP 162/98 | HR 91 | Wt >= 6400 oz

## 2019-09-10 DIAGNOSIS — I480 Paroxysmal atrial fibrillation: Secondary | ICD-10-CM

## 2019-09-10 DIAGNOSIS — K219 Gastro-esophageal reflux disease without esophagitis: Secondary | ICD-10-CM | POA: Diagnosis not present

## 2019-09-10 DIAGNOSIS — Z8249 Family history of ischemic heart disease and other diseases of the circulatory system: Secondary | ICD-10-CM | POA: Insufficient documentation

## 2019-09-10 DIAGNOSIS — Z79899 Other long term (current) drug therapy: Secondary | ICD-10-CM | POA: Insufficient documentation

## 2019-09-10 DIAGNOSIS — Z7984 Long term (current) use of oral hypoglycemic drugs: Secondary | ICD-10-CM | POA: Diagnosis not present

## 2019-09-10 DIAGNOSIS — Z833 Family history of diabetes mellitus: Secondary | ICD-10-CM | POA: Diagnosis not present

## 2019-09-10 DIAGNOSIS — Z87891 Personal history of nicotine dependence: Secondary | ICD-10-CM | POA: Diagnosis not present

## 2019-09-10 DIAGNOSIS — Z86711 Personal history of pulmonary embolism: Secondary | ICD-10-CM | POA: Diagnosis not present

## 2019-09-10 DIAGNOSIS — E119 Type 2 diabetes mellitus without complications: Secondary | ICD-10-CM | POA: Insufficient documentation

## 2019-09-10 DIAGNOSIS — Z6841 Body Mass Index (BMI) 40.0 and over, adult: Secondary | ICD-10-CM | POA: Insufficient documentation

## 2019-09-10 DIAGNOSIS — G4733 Obstructive sleep apnea (adult) (pediatric): Secondary | ICD-10-CM | POA: Insufficient documentation

## 2019-09-10 DIAGNOSIS — Z8619 Personal history of other infectious and parasitic diseases: Secondary | ICD-10-CM | POA: Diagnosis not present

## 2019-09-10 DIAGNOSIS — Z7901 Long term (current) use of anticoagulants: Secondary | ICD-10-CM | POA: Insufficient documentation

## 2019-09-10 DIAGNOSIS — I10 Essential (primary) hypertension: Secondary | ICD-10-CM

## 2019-09-10 DIAGNOSIS — I110 Hypertensive heart disease with heart failure: Secondary | ICD-10-CM | POA: Diagnosis not present

## 2019-09-10 DIAGNOSIS — I5032 Chronic diastolic (congestive) heart failure: Secondary | ICD-10-CM

## 2019-09-10 DIAGNOSIS — E876 Hypokalemia: Secondary | ICD-10-CM

## 2019-09-10 LAB — CBC
HCT: 42.6 % (ref 39.0–52.0)
Hemoglobin: 11.8 g/dL — ABNORMAL LOW (ref 13.0–17.0)
MCH: 20.4 pg — ABNORMAL LOW (ref 26.0–34.0)
MCHC: 27.7 g/dL — ABNORMAL LOW (ref 30.0–36.0)
MCV: 73.7 fL — ABNORMAL LOW (ref 80.0–100.0)
Platelets: 414 10*3/uL — ABNORMAL HIGH (ref 150–400)
RBC: 5.78 MIL/uL (ref 4.22–5.81)
RDW: 20.9 % — ABNORMAL HIGH (ref 11.5–15.5)
WBC: 7.8 10*3/uL (ref 4.0–10.5)
nRBC: 0 % (ref 0.0–0.2)

## 2019-09-10 LAB — BASIC METABOLIC PANEL
Anion gap: 11 (ref 5–15)
BUN: 17 mg/dL (ref 6–20)
CO2: 36 mmol/L — ABNORMAL HIGH (ref 22–32)
Calcium: 8.9 mg/dL (ref 8.9–10.3)
Chloride: 87 mmol/L — ABNORMAL LOW (ref 98–111)
Creatinine, Ser: 1.65 mg/dL — ABNORMAL HIGH (ref 0.61–1.24)
GFR calc Af Amer: 54 mL/min — ABNORMAL LOW (ref >60)
GFR calc non Af Amer: 47 mL/min — ABNORMAL LOW (ref >60)
Glucose, Bld: 141 mg/dL — ABNORMAL HIGH (ref 70–99)
Potassium: 3.3 mmol/L — ABNORMAL LOW (ref 3.5–5.1)
Sodium: 134 mmol/L — ABNORMAL LOW (ref 135–145)

## 2019-09-10 LAB — MAGNESIUM: Magnesium: 1.8 mg/dL (ref 1.7–2.4)

## 2019-09-10 MED ORDER — LOSARTAN POTASSIUM 25 MG PO TABS: 25.0000 mg | ORAL_TABLET | Freq: Every day | ORAL | 11 refills | Status: DC

## 2019-09-10 MED ORDER — POTASSIUM CHLORIDE CRYS ER 20 MEQ PO TBCR: 40.0000 meq | EXTENDED_RELEASE_TABLET | Freq: Three times a day (TID) | ORAL | 3 refills | Status: DC

## 2019-09-10 NOTE — Patient Instructions (Signed)
Routine lab work today. Will notify you of abnormal results  TAKE Losartan 25mg  daily  Repeat labs in 1 week  Follow up in 4 weeks

## 2019-09-10 NOTE — Telephone Encounter (Signed)
-----   Message from Conrad Hague, NP sent at 09/10/2019  1:03 PM EST ----- Increase potassium to 40 meq three times a day. Repeat BMET in 7 days.

## 2019-09-10 NOTE — Progress Notes (Signed)
Advanced Heart Failure Clinic Note   Referring Physician: PCP: [name], NP PCP-Cardiologist: Dorris Carnes, MD  [facility]: Dr. Aundra Dubin   HPI: Mr.Williamsis a 53 yo with hx of diastolic CHF, HTN, DM, Atrial fib, PE in April 2020 (on Xarelto), OSA, GERD,chronic elevation of troponin.  Admitted 01/21/2019 with increased dyspnea and had PE. He was anticoagulated.   Patient re-admitted 5/20 for syncope/cough/dyspnea, COVID+, CT positive for bilateral GGOs, admitted to [facility] and discharged after 6 days, on O2. Rec'd Cards f/u.  Patient re-admitted 02/20/19 for bright red blood per rectum, eval'ed by GI, had EGD/colon, dx'd with hemorrhoidal bleeding, discharged after 9 days on O2.He was again Covid + .   Readmitted 03/11/19 with increased shortness of breath. He did not have home oxygen. He was discharged to home the next day.  He presented to [facility] ED7/23/20with lower extremity edema. COVID negative. Diuresed with lasix drip and transitioned to torsemide 40 mg twice daily. Had [place] with mild volume overload and preserved cardiac output. Discharge weight was 407.9 pounds.   He was readmitted with symptomatic anemia 05/28/19 with hgb 7.1. Received 1UPRBCs. GI consulted. He started on anusol and continued on stool softner.  He had f/u visit 10/20 and was complaining of increased leg edema and weight gain. SOB with exertion. DeniedPND. + Orthopnea. Nobleeding issues.Was eating high sodium food -->Bo jangles chicken. No fever or chills. Taking all medications. Followed closely by HF Paramedicine.He was given a dose of IV Lasix in clinic, 80 mg w/ >1300 urine output after IV lasix.His home torsemide dose was increased to 100 bid and he was instructed to f/u in 2 weeks.   He was readmitted to the hospital on 07/16/19 when he presented to the ED w/ worsening symptoms and was admitted by IM for a/c diastolic CHF and treated w/ IV Lasix. Had 2 day hospital stay and was discharged on 10/29.  Echo was done 10/28 and showed normal LVEF 65-70%, RV interpreted as normal but suspect significant RV dysfunction.  After diuresis w/ IV Lasix, he was placed back on home diuretic regimen, torsemide 100 mg bid. Lisinopril 5 mg was also added to regimen. AHF team was not consulted that admit.  He was seen back in clinic 07/30/19. Felt poorly. SOB at rest and worse w/ exertion. Also w/ orthopnea/PND. Significant wt gain of 20 lb up to 428 lb (dry wt ~407 lb), w/ marked abdominal distention and LEE and poor response to IV diuretics. He was directly admitted from clinic to [facility] for IV diuretics.  He was placed on IV Lasix 80 mg bid w/ great response. PICC line was placed to measure CVP. Net diuresis over 15 L. Diuresed down from admit wt of 427 lb to dry wt of 408 lb at discharge. CVP down to 8-9. He was transitioned back to PO torsemide 100 mg bid. He had slight bump in SCr to 1.2 and lisinopril was discontinued. Labetalol increased to 400 mg bid. Spironolactone was also added.   He was seen in the ER 11/24 with volume overload/weight gain.  He was enrolled in the FREEDOM trial and has been getting subcutaneous Lasix.    Today he returns for HF follow up. Last week metolazone was increased to 5 mg 3 times a week. Overall feeling a little better. SOB with exertion but a little bit better.  Denies PND/Orthopnea. Appetite ok. Tries to follow low salt diet.  No fever or chills. Weight at home 398-402  pounds. Taking all medications. Followed closely by  HF Paramedicine.   Labs (11/20): BNP 32, K 3.6, creatinine 1.09 Labs 08/16/19: K 4.3 Creatinine 1.15 BNP 13.9  WBC 7  ECG (11/20): NSR, normal (personally reviewed).   Review of Systems: All systems reviewed and negative except as per HPI.   PMH: 1. Atrial fibrillation: Paroxysmal.  2. Pulmonary embolus: 5/20.  3. OHS/OSA: He is on home oxygen during the day and uses CPAP at night.  4. Morbid obesity.  5. Chronic diastolic CHF:  - RHC (9/24): mean RA  12, PA 40/25 mean 31, mean PCWP 23, CI 2.47, PVR 1.1 WU.  - Echo (10/20): EF 65-70%, mild LVH, normal RV size and systolic function.  6. Type 2 diabetes 7. HTN 8. Rectal bleeding: ?Hemorrhoidal.  9. COVID-19 infection 7/20.   Current Outpatient Medications  Medication Sig Dispense Refill  . albuterol (VENTOLIN HFA) 108 (90 Base) MCG/ACT inhaler Inhale 2 puffs into the lungs every 6 (six) hours as needed for wheezing or shortness of breath. 8.5 g 2  . amLODipine (NORVASC) 10 MG tablet Take 1 tablet (10 mg total) by mouth daily. 90 tablet 1  . atorvastatin (LIPITOR) 20 MG tablet Take 1 tablet (20 mg total) by mouth at bedtime. 90 tablet 2  . Blood Glucose Monitoring Suppl (TRUE METRIX METER) w/Device KIT Use as instructed. Check blood glucose level by fingerstick twice per day. 1 kit 0  . carboxymethylcellulose (REFRESH PLUS) 0.5 % SOLN Place 1 drop into both eyes daily.     Marland Kitchen docusate sodium (COLACE) 100 MG capsule Take 1 capsule (100 mg total) by mouth 2 (two) times daily. 180 capsule 1  . ferrous sulfate 325 (65 FE) MG tablet Take 1 tablet (325 mg total) by mouth daily. Patient will pick up scripts today. 90 tablet 0  . glucose blood (TRUE METRIX BLOOD GLUCOSE TEST) test strip Use as instructed. Check blood glucose level by fingerstick twice per day. 100 each 12  . labetalol (NORMODYNE) 200 MG tablet Take 2 tablets (400 mg total) by mouth 2 (two) times daily. 120 tablet 3  . levalbuterol (XOPENEX HFA) 45 MCG/ACT inhaler Inhale 2 puffs into the lungs every 4 (four) hours as needed for wheezing.     . metFORMIN (GLUCOPHAGE) 500 MG tablet Take 1 tablet (500 mg total) by mouth 2 (two) times daily with a meal. Patient will pick up scripts today. 180 tablet 1  . metolazone (ZAROXOLYN) 5 MG tablet Take 1 tablet every Monday, Wednesday, and Friday. 15 tablet 3  . Misc. Devices MISC Requires O2 @ 3L/min continuously via nasal canula and home fill system,. 1 each 0  . Misc. Devices MISC Please provide  Mr. Dunton with insurance approved portable O2 concentrator ICD 10 J96.11 Z99.81 1 each 0  . omeprazole (PRILOSEC) 20 MG capsule Take 1 capsule (20 mg total) by mouth daily. 30 capsule 3  . potassium chloride SA (KLOR-CON) 20 MEQ tablet Take 2 tablets (40 mEq total) by mouth 2 (two) times daily. Take an extra 2 tabs every Monday 360 tablet 3  . rivaroxaban (XARELTO) 20 MG TABS tablet Take 1 tablet (20 mg total) by mouth daily with supper. 60 tablet 6  . spironolactone (ALDACTONE) 25 MG tablet Take 1 tablet (25 mg total) by mouth daily. 30 tablet 3  . torsemide (DEMADEX) 100 MG tablet Take 1 tablet (100 mg total) by mouth 2 (two) times daily. 180 tablet 3  . TRUEplus Lancets 28G MISC Use as instructed. Check blood glucose level by fingerstick twice per  day. 200 each 3   No current facility-administered medications for this encounter.    No Known Allergies    Social History   Socioeconomic History  . Marital status: Single    Spouse name: Not on file  . Number of children: Not on file  . Years of education: Not on file  . Highest education level: Not on file  Occupational History  . Not on file  Tobacco Use  . Smoking status: Former Research scientist (life sciences)  . Smokeless tobacco: Never Used  Substance and Sexual Activity  . Alcohol use: Not Currently  . Drug use: Not Currently  . Sexual activity: Not Currently  Other Topics Concern  . Not on file  Social History Narrative  . Not on file   Social Determinants of Health   Financial Resource Strain:   . Difficulty of Paying Living Expenses: Not on file  Food Insecurity:   . Worried About Charity fundraiser in the Last Year: Not on file  . Ran Out of Food in the Last Year: Not on file  Transportation Needs:   . Lack of Transportation (Medical): Not on file  . Lack of Transportation (Non-Medical): Not on file  Physical Activity:   . Days of Exercise per Week: Not on file  . Minutes of Exercise per Session: Not on file  Stress:   . Feeling  of Stress : Not on file  Social Connections:   . Frequency of Communication with Friends and Family: Not on file  . Frequency of Social Gatherings with Friends and Family: Not on file  . Attends Religious Services: Not on file  . Active Member of Clubs or Organizations: Not on file  . Attends Archivist Meetings: Not on file  . Marital Status: Not on file  Intimate Partner Violence:   . Fear of Current or Ex-Partner: Not on file  . Emotionally Abused: Not on file  . Physically Abused: Not on file  . Sexually Abused: Not on file      Family History  Problem Relation Age of Onset  . Hypertension Mother   . Diabetes Mother     Vitals:   09/10/19 0956  BP: (!) 162/98  Pulse: 91  SpO2: 96%  Weight: (!) 182.1 kg (401 lb 8 oz)    Wt Readings from Last 3 Encounters:  09/10/19 (!) 182.1 kg (401 lb 8 oz)  09/03/19 (!) 188.2 kg (415 lb)  08/27/19 (!) 185.5 kg (409 lb)    PHYSICAL EXAM: General:  Walked in the clinic on 2 liters oxygen.  No resp difficulty HEENT: normal Neck: supple. no JVD. Carotids 2+ bilat; no bruits. No lymphadenopathy or thryomegaly appreciated. Cor: PMI nondisplaced. Regular rate & rhythm. No rubs, gallops or murmurs. Lungs: clear Abdomen: obese, soft, nontender, nondistended. No hepatosplenomegaly. No bruits or masses. Good bowel sounds. Extremities: no cyanosis, clubbing, rash, edema Neuro: alert & orientedx3, cranial nerves grossly intact. moves all 4 extremities w/o difficulty. Affect pleasant    ASSESSMENT & PLAN:  1. Chronic diastolic CHF: Echo in 39/76 with EF 65-70%, RV interpreted as normal but suspect significant RV dysfunction.  He has completed a 7 doses of [place] lasix.   - NYHA III.  Volume status stable today. Continue torsemide 100 mg bid + metolazone 5 mg metolazone three times a week.   - Continue current dose of potassium  - Continue spironolactone 25 mg daily.  - Discussed low salt food choices and limiting fluid intake to <  2  liters per day.  2. Atrial fibrillation: Paroxysmal. Regular pulse.  - Continue Xarelto.  3. H/o PE: 5/20, continue Xarelto.  4. OHS/OSA: Continue CPAP at night and oxygen during the day. 5. HTN Continue current regimen. Add 25 mg losartan daily. Check BMET next week  6. Obesity Body mass index is 56 kg/m. Discussed portion control.    Start Date for FREEDOM HF Trial : 08/13/19   Any change in the diuretic regimen since last point of contact? On 09/03/19 metolazone was increased to 5 mg three times a week.   Abdominal skin assessment is without evidence of skin reaction? No reaction Admitted to hospital since last ED visit? No  Admitted to hospital for heart failure related issues? NO  Evaluated in the emergency department for HF related issues? No  Office visit for HF related issues? No  Sought any additional or new medical treatment for HF related issues? No   Any problems with drug or device? No  NYHA Class III, chronic   Check CBC, BMET, Mag, BNP today.    Follow up in 4 weeks. Continue HF Paramedicine.    Janiah Devinney NP-C  09/10/2019 10:09 AM

## 2019-09-10 NOTE — Progress Notes (Signed)
Paramedicine Encounter   Patient ID: Benjamin [name] , male,   DOB: August 28, 1966,53 y.o.,  MRN: [id number]   Met patient in clinic today with provider.   weight @ clinic-401 Weight @ home this morning-398 B/p-162/98 p-91 sp02-96  Amy is adding losartan 31m for him to take.  Pt states he is doing good. I will p/u med from pharmacy and monitor his lab results on his potassium for any changes and will see him later this afternoon to make those changes.   KMarylouise Stacks [place]12/22/2020

## 2019-09-10 NOTE — Progress Notes (Signed)
CSW met with pt during his appt to check in.  Pt reports that he has received further paperwork which he got from [employer] that he wants help with CSW looking over.  Paperwork details that pt will be receiving increase in SSI payment to $794 for next year but also states that he will be receiving a reduced payment until his "overpayment" is returned.  Pt states that he had been informed by phone, however, that the waiver we filled out was approved and he would be receiving his full payment.  CSW also reminded pt of appt with [facility] tomorrow in order to submit housing applications as patient is still searching for alternative housing options.  Pt confirms and will need a taxi for appt as he does not have transport.  CSW will continue to follow and assist as needed  [name], [facility] Desk#: [phone] Cell#: [phone]

## 2019-09-10 NOTE — Progress Notes (Signed)
Came by to place losartan and his increased potassium dose in pill box.  I placed the losartan in the bedtime dose.    [name], EMT-Paramedic 09/10/19
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
## 2019-09-11 ENCOUNTER — Telehealth (HOSPITAL_COMMUNITY): Payer: Self-pay | Admitting: Licensed Clinical Social Worker

## 2019-09-11 DIAGNOSIS — J9611 Chronic respiratory failure with hypoxia: Secondary | ICD-10-CM | POA: Diagnosis not present

## 2019-09-11 DIAGNOSIS — I5032 Chronic diastolic (congestive) heart failure: Secondary | ICD-10-CM | POA: Diagnosis not present

## 2019-09-11 NOTE — Telephone Encounter (Signed)
CSW met with pt at [facility] and assisted with completing housing applications- pt is now on the waitlist (number [phone] at [employer] and [employer] and number 23 at [employer]).  CSW then attempted to call [place] worker, Mr Trilby Drummer ext [phone], to check on status of waiver- unable to reach left message.  CSW will continue to follow and assist as needed  [name], [facility] Desk#: [phone] Cell#: [phone]

## 2019-09-16 ENCOUNTER — Other Ambulatory Visit (HOSPITAL_COMMUNITY): Payer: Self-pay
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
## 2019-09-17 ENCOUNTER — Ambulatory Visit (HOSPITAL_COMMUNITY)
Admission: RE | Admit: 2019-09-17 | Discharge: 2019-09-17 | Disposition: A | Discharge status: Home / Self Care | Payer: Medicaid Other | Source: Ambulatory Visit | Attending: Cardiology | Admitting: Cardiology

## 2019-09-17 ENCOUNTER — Telehealth (HOSPITAL_COMMUNITY): Payer: Self-pay | Admitting: Licensed Clinical Social Worker

## 2019-09-17 ENCOUNTER — Other Ambulatory Visit: Payer: Self-pay

## 2019-09-17 DIAGNOSIS — I5032 Chronic diastolic (congestive) heart failure: Secondary | ICD-10-CM | POA: Diagnosis not present

## 2019-09-17 LAB — BASIC METABOLIC PANEL
Anion gap: 13 (ref 5–15)
BUN: 18 mg/dL (ref 6–20)
CO2: 33 mmol/L — ABNORMAL HIGH (ref 22–32)
Calcium: 8.8 mg/dL — ABNORMAL LOW (ref 8.9–10.3)
Chloride: 93 mmol/L — ABNORMAL LOW (ref 98–111)
Creatinine, Ser: 1.28 mg/dL — ABNORMAL HIGH (ref 0.61–1.24)
GFR calc Af Amer: >60 mL/min (ref >60)
GFR calc non Af Amer: >60 mL/min (ref >60)
Glucose, Bld: 115 mg/dL — ABNORMAL HIGH (ref 70–99)
Potassium: 3.3 mmol/L — ABNORMAL LOW (ref 3.5–5.1)
Sodium: 139 mmol/L (ref 135–145)

## 2019-09-17 NOTE — Telephone Encounter (Addendum)
CSW informed by patient that he dropped his pill box again (this has now happened twice) because his fingers are too big and they get stuck trying to get his pills out.  Last time he did this he took the incorrect pills because they got all jumbled up.   CSW discussed with paramedic and able to find pill box that is bigger and will allow him to separate his days so that his whole week won't be messed up if he drops the box.  Order placed and anticipate will arrive by Thursday- paramedic to go help pt set up.  Community paramedic also reported that patient is unable to prepare food due to lack of a working oven at his current residence.  Pt has been eating food out of the can and processed foods like chips as that is what he has been able to prepare.  Pt states that he has a microwave and would be able to heat foods up there.    CSW referred to [employer] program for 4 weeks of low sodium meal deliveries- should arrive in 3 business days  CSW will continue to follow and assist as needed.  [name], LCSW Clinical Social Worker [facility] Desk#: [phone] Cell#: [phone]

## 2019-09-18 ENCOUNTER — Telehealth (HOSPITAL_COMMUNITY): Payer: Self-pay | Admitting: Licensed Clinical Social Worker

## 2019-09-18 NOTE — Telephone Encounter (Signed)
CSW continuing to assist pt in finding alternate housing as his currently living environment is insufficient.  CSW sent in application to [place] for a duplex apartment- awaiting determination.  CSW will continue to follow and assist as needed  [name], [facility] Desk#: [phone] Cell#: [phone]

## 2019-09-19 ENCOUNTER — Telehealth (HOSPITAL_COMMUNITY): Payer: Self-pay | Admitting: *Deleted

## 2019-09-19 NOTE — Telephone Encounter (Signed)
Attempted to contact patient for weekly follow-up regarding exercise and wellness. No Answer and Voicemail full.   Will attempt to reach patient again next week.    Landis Martins, MS, ACSM-RCEP Clinical Exercise Physiologist

## 2019-09-20 DIAGNOSIS — I5032 Chronic diastolic (congestive) heart failure: Secondary | ICD-10-CM | POA: Diagnosis not present

## 2019-09-20 DIAGNOSIS — J9611 Chronic respiratory failure with hypoxia: Secondary | ICD-10-CM | POA: Diagnosis not present

## 2019-09-23 ENCOUNTER — Other Ambulatory Visit: Payer: Self-pay

## 2019-09-23 ENCOUNTER — Other Ambulatory Visit (HOSPITAL_COMMUNITY): Payer: Self-pay

## 2019-09-23 ENCOUNTER — Other Ambulatory Visit (HOSPITAL_COMMUNITY): Payer: Self-pay | Admitting: Cardiology

## 2019-09-23 ENCOUNTER — Encounter (HOSPITAL_COMMUNITY): Payer: Self-pay | Admitting: Emergency Medicine

## 2019-09-23 ENCOUNTER — Emergency Department (HOSPITAL_COMMUNITY)
Admission: EM | Admit: 2019-09-23 | Discharge: 2019-09-24 | Discharge status: Against Medical Advice | Payer: Medicaid Other | Attending: Emergency Medicine | Admitting: Emergency Medicine

## 2019-09-23 DIAGNOSIS — I10 Essential (primary) hypertension: Secondary | ICD-10-CM

## 2019-09-23 DIAGNOSIS — W19XXXA Unspecified fall, initial encounter: Secondary | ICD-10-CM | POA: Diagnosis not present

## 2019-09-23 DIAGNOSIS — Z5321 Procedure and treatment not carried out due to patient leaving prior to being seen by health care provider: Secondary | ICD-10-CM | POA: Insufficient documentation

## 2019-09-23 DIAGNOSIS — E876 Hypokalemia: Secondary | ICD-10-CM

## 2019-09-23 DIAGNOSIS — Z043 Encounter for examination and observation following other accident: Secondary | ICD-10-CM | POA: Diagnosis present

## 2019-09-23 DIAGNOSIS — R52 Pain, unspecified: Secondary | ICD-10-CM | POA: Diagnosis not present

## 2019-09-23 MED ORDER — POTASSIUM CHLORIDE CRYS ER 20 MEQ PO TBCR: 40.0000 meq | EXTENDED_RELEASE_TABLET | Freq: Three times a day (TID) | ORAL | 3 refills | Status: DC

## 2019-09-23 NOTE — Progress Notes (Signed)
Paramedicine Encounter    Patient ID: Benjamin [name], male    DOB: 12-23-1965, 54 y.o.   MRN: [id number]   Patient Care Team: [name], NP as PCP - General (Nurse Practitioner) [name], MD as PCP - Cardiology (Cardiology) [name], LCSW as Social Worker (Licensed Clinical Social Worker)  Patient Active Problem List   Diagnosis Date Noted  . Acute on chronic diastolic CHF (congestive heart failure), NYHA class 3 ([place]) 07/30/2019  . Diabetes mellitus without complication ([place]) 65/99/3570  . Hypertensive emergency   . BRBPR (bright red blood per rectum) 06/12/2019  . Hypertension   . CHF (congestive heart failure) ([place])   . Sleep apnea   . Chronic respiratory failure ([place])   . Lower GI bleeding 06/11/2019  . Hypokalemia 05/29/2019  . GI bleed 05/29/2019  . Anemia 05/28/2019  . Acute on chronic diastolic CHF (congestive heart failure) ([place]) 04/11/2019  . Iron deficiency anemia 03/11/2019  . OSA on CPAP 03/11/2019  . HLD (hyperlipidemia) 03/11/2019  . Elevated troponin 03/11/2019  . GERD (gastroesophageal reflux disease) 03/11/2019  . Rectal bleeding 02/20/2019  . Dyspnea 02/06/2019  . COVID-19 virus infection 02/06/2019  . Bilateral lower extremity edema   . Morbid obesity with BMI of 50.0-59.9, adult ([place])   . Chronic diastolic CHF (congestive heart failure) ([place])   . PAF (paroxysmal atrial fibrillation) ([place])   . PE (pulmonary thromboembolism) ([place]) 01/21/2019  . Hypertensive urgency 01/21/2019  . Diabetes mellitus type 2 in obese ([place]) 01/21/2019    Current Outpatient Medications:  .  albuterol (VENTOLIN HFA) 108 (90 Base) MCG/ACT inhaler, Inhale 2 puffs into the lungs every 6 (six) hours as needed for wheezing or shortness of breath., Disp: 8.5 g, Rfl: 2 .  amLODipine (NORVASC) 10 MG tablet, Take 1 tablet (10 mg total) by mouth daily., Disp: 90 tablet, Rfl: 1 .  atorvastatin (LIPITOR) 20 MG tablet, Take 1 tablet (20 mg total) by mouth at bedtime.,  Disp: 90 tablet, Rfl: 2 .  Blood Glucose Monitoring Suppl (TRUE METRIX METER) w/Device KIT, Use as instructed. Check blood glucose level by fingerstick twice per day., Disp: 1 kit, Rfl: 0 .  carboxymethylcellulose (REFRESH PLUS) 0.5 % SOLN, Place 1 drop into both eyes daily. , Disp: , Rfl:  .  docusate sodium (COLACE) 100 MG capsule, Take 1 capsule (100 mg total) by mouth 2 (two) times daily., Disp: 180 capsule, Rfl: 1 .  ferrous sulfate 325 (65 FE) MG tablet, Take 1 tablet (325 mg total) by mouth daily. Patient will pick up scripts today., Disp: 90 tablet, Rfl: 0 .  glucose blood (TRUE METRIX BLOOD GLUCOSE TEST) test strip, Use as instructed. Check blood glucose level by fingerstick twice per day., Disp: 100 each, Rfl: 12 .  labetalol (NORMODYNE) 200 MG tablet, Take 2 tablets (400 mg total) by mouth 2 (two) times daily., Disp: 120 tablet, Rfl: 3 .  levalbuterol (XOPENEX HFA) 45 MCG/ACT inhaler, Inhale 2 puffs into the lungs every 4 (four) hours as needed for wheezing. , Disp: , Rfl:  .  losartan (COZAAR) 25 MG tablet, Take 1 tablet (25 mg total) by mouth daily. (Patient taking differently: Take 25 mg by mouth at bedtime. ), Disp: 30 tablet, Rfl: 11 .  metFORMIN (GLUCOPHAGE) 500 MG tablet, Take 1 tablet (500 mg total) by mouth 2 (two) times daily with a meal. Patient will pick up scripts today., Disp: 180 tablet, Rfl: 1 .  metolazone (ZAROXOLYN) 5 MG tablet, Take 1  tablet every Monday, Wednesday, and Friday., Disp: 15 tablet, Rfl: 3 .  Misc. Devices MISC, Requires O2 @ 3L/min continuously via nasal canula and home fill system,., Disp: 1 each, Rfl: 0 .  Misc. Devices MISC, Please provide Mr. Ontiveros with insurance approved portable O2 concentrator ICD 10 J96.11 Z99.81, Disp: 1 each, Rfl: 0 .  omeprazole (PRILOSEC) 20 MG capsule, Take 1 capsule (20 mg total) by mouth daily., Disp: 30 capsule, Rfl: 3 .  potassium chloride SA (KLOR-CON) 20 MEQ tablet, Take 2 tablets (40 mEq total) by mouth 3 (three) times  daily. Take an extra 2 tabs every dose of metolazone, Disp: 360 tablet, Rfl: 3 .  rivaroxaban (XARELTO) 20 MG TABS tablet, Take 1 tablet (20 mg total) by mouth daily with supper., Disp: 60 tablet, Rfl: 6 .  spironolactone (ALDACTONE) 25 MG tablet, Take 1 tablet (25 mg total) by mouth daily., Disp: 30 tablet, Rfl: 3 .  torsemide (DEMADEX) 100 MG tablet, Take 1 tablet (100 mg total) by mouth 2 (two) times daily., Disp: 180 tablet, Rfl: 3 .  TRUEplus Lancets 28G MISC, Use as instructed. Check blood glucose level by fingerstick twice per day., Disp: 200 each, Rfl: 3 No Known Allergies    Social History   Socioeconomic History  . Marital status: Single    Spouse name: Not on file  . Number of children: Not on file  . Years of education: Not on file  . Highest education level: Not on file  Occupational History  . Not on file  Tobacco Use  . Smoking status: Former Research scientist (life sciences)  . Smokeless tobacco: Never Used  Substance and Sexual Activity  . Alcohol use: Not Currently  . Drug use: Not Currently  . Sexual activity: Not Currently  Other Topics Concern  . Not on file  Social History Narrative  . Not on file   Social Determinants of Health   Financial Resource Strain:   . Difficulty of Paying Living Expenses: Not on file  Food Insecurity:   . Worried About Charity fundraiser in the Last Year: Not on file  . Ran Out of Food in the Last Year: Not on file  Transportation Needs:   . Lack of Transportation (Medical): Not on file  . Lack of Transportation (Non-Medical): Not on file  Physical Activity:   . Days of Exercise per Week: Not on file  . Minutes of Exercise per Session: Not on file  Stress:   . Feeling of Stress : Not on file  Social Connections:   . Frequency of Communication with Friends and Family: Not on file  . Frequency of Social Gatherings with Friends and Family: Not on file  . Attends Religious Services: Not on file  . Active Member of Clubs or Organizations: Not on  file  . Attends Archivist Meetings: Not on file  . Marital Status: Not on file  Intimate Partner Violence:   . Fear of Current or Ex-Partner: Not on file  . Emotionally Abused: Not on file  . Physically Abused: Not on file  . Sexually Abused: Not on file    Physical Exam      Future Appointments  Date Time Provider [place]  09/30/2019  1:30 PM [name], NP [facility] None  10/08/2019 10:30 AM [facility] PA/NP [facility] None    BP (!) 126/0   Pulse 92   Resp 20   SpO2 96%  [facility] EMS-160  Came back out to see pt after I  picked up pts meds from pharmacy, when I called him on the way to ensure he was at home he states he thinks he blacked out.  When I arrived he was in the living room area sitting in chair, he was not talking, seemed very confused. Leaning to the left, had a very dazed look in his eyes, unable to move his left arm or leg and unable to squeeze my fingers. He was unable to give me a "smile" for facial droop assessment. After a few min, he was coming to better and seemed to say he hit his head and his left arm, side and head is hurting. The bedside table is upside down a few feet away from where it usually sits. His GF was there a few min before me after he sent a mis-spelled text that he was sick and he fell.  I called for EMS unit to come take him to hosp.  V/s as noted.  He was not able to tell me what day/month it was yet.  EMS unit arrived, he was becoming more aware of how he was feeling.  He now says he remembers going to bathroom and when he came back he states his legs gave out on him. He then states his legs have been giving out on him a lot and he has had a lot of falls lately. He has not reported this to me.  Will f/u when he returns home.  His GF gave him his phone charger and she is holding on to his 02 concentrator and she locked his room in the house.   [name], [place] [facility] Paramedic   09/23/19

## 2019-09-23 NOTE — Progress Notes (Signed)
Paramedicine Encounter    Patient ID: Benjamin [name], male    DOB: [birth year], 54 y.o.   MRN: [id number]   Patient Care Team: [name], NP as PCP - General (Nurse Practitioner) [name], MD as PCP - Cardiology (Cardiology) [name], LCSW as Social Worker (Licensed Clinical Social Worker)  Patient Active Problem List   Diagnosis Date Noted  . Acute on chronic diastolic CHF (congestive heart failure), NYHA class 3 ([place]) 07/30/2019  . Diabetes mellitus without complication ([place]) 15/61/5379  . Hypertensive emergency   . BRBPR (bright red blood per rectum) 06/12/2019  . Hypertension   . CHF (congestive heart failure) ([place])   . Sleep apnea   . Chronic respiratory failure ([place])   . Lower GI bleeding 06/11/2019  . Hypokalemia 05/29/2019  . GI bleed 05/29/2019  . Anemia 05/28/2019  . Acute on chronic diastolic CHF (congestive heart failure) ([place]) 04/11/2019  . Iron deficiency anemia 03/11/2019  . OSA on CPAP 03/11/2019  . HLD (hyperlipidemia) 03/11/2019  . Elevated troponin 03/11/2019  . GERD (gastroesophageal reflux disease) 03/11/2019  . Rectal bleeding 02/20/2019  . Dyspnea 02/06/2019  . COVID-19 virus infection 02/06/2019  . Bilateral lower extremity edema   . Morbid obesity with BMI of 50.0-59.9, adult ([place])   . Chronic diastolic CHF (congestive heart failure) ([place])   . PAF (paroxysmal atrial fibrillation) ([place])   . PE (pulmonary thromboembolism) ([place]) 01/21/2019  . Hypertensive urgency 01/21/2019  . Diabetes mellitus type 2 in obese ([place]) 01/21/2019    Current Outpatient Medications:  .  albuterol (VENTOLIN HFA) 108 (90 Base) MCG/ACT inhaler, Inhale 2 puffs into the lungs every 6 (six) hours as needed for wheezing or shortness of breath., Disp: 8.5 g, Rfl: 2 .  Blood Glucose Monitoring Suppl (TRUE METRIX METER) w/Device KIT, Use as instructed. Check blood glucose level by fingerstick twice per day., Disp: 1 kit, Rfl: 0 .  carboxymethylcellulose (REFRESH  PLUS) 0.5 % SOLN, Place 1 drop into both eyes daily. , Disp: , Rfl:  .  docusate sodium (COLACE) 100 MG capsule, Take 1 capsule (100 mg total) by mouth 2 (two) times daily., Disp: 180 capsule, Rfl: 1 .  glucose blood (TRUE METRIX BLOOD GLUCOSE TEST) test strip, Use as instructed. Check blood glucose level by fingerstick twice per day., Disp: 100 each, Rfl: 12 .  labetalol (NORMODYNE) 200 MG tablet, Take 2 tablets (400 mg total) by mouth 2 (two) times daily., Disp: 120 tablet, Rfl: 3 .  levalbuterol (XOPENEX HFA) 45 MCG/ACT inhaler, Inhale 2 puffs into the lungs every 4 (four) hours as needed for wheezing. , Disp: , Rfl:  .  losartan (COZAAR) 25 MG tablet, Take 1 tablet (25 mg total) by mouth daily. (Patient taking differently: Take 25 mg by mouth at bedtime. ), Disp: 30 tablet, Rfl: 11 .  metFORMIN (GLUCOPHAGE) 500 MG tablet, Take 1 tablet (500 mg total) by mouth 2 (two) times daily with a meal. Patient will pick up scripts today., Disp: 180 tablet, Rfl: 1 .  metolazone (ZAROXOLYN) 5 MG tablet, Take 1 tablet every Monday, Wednesday, and Friday., Disp: 15 tablet, Rfl: 3 .  Misc. Devices MISC, Requires O2 @ 3L/min continuously via nasal canula and home fill system,., Disp: 1 each, Rfl: 0 .  Misc. Devices MISC, Please provide Mr. Meras with insurance approved portable O2 concentrator ICD 10 J96.11 Z99.81, Disp: 1 each, Rfl: 0 .  omeprazole (PRILOSEC) 20 MG capsule, Take 1 capsule (20 mg total) by mouth  daily., Disp: 30 capsule, Rfl: 3 .  rivaroxaban (XARELTO) 20 MG TABS tablet, Take 1 tablet (20 mg total) by mouth daily with supper., Disp: 60 tablet, Rfl: 6 .  spironolactone (ALDACTONE) 25 MG tablet, Take 1 tablet (25 mg total) by mouth daily., Disp: 30 tablet, Rfl: 3 .  torsemide (DEMADEX) 100 MG tablet, Take 1 tablet (100 mg total) by mouth 2 (two) times daily., Disp: 180 tablet, Rfl: 3 .  TRUEplus Lancets 28G MISC, Use as instructed. Check blood glucose level by fingerstick twice per day., Disp: 200  each, Rfl: 3 .  amLODipine (NORVASC) 10 MG tablet, Take 1 tablet (10 mg total) by mouth daily., Disp: 90 tablet, Rfl: 1 .  atorvastatin (LIPITOR) 20 MG tablet, Take 1 tablet (20 mg total) by mouth at bedtime., Disp: 90 tablet, Rfl: 2 .  ferrous sulfate 325 (65 FE) MG tablet, Take 1 tablet (325 mg total) by mouth daily. Patient will pick up scripts today., Disp: 90 tablet, Rfl: 0 .  potassium chloride SA (KLOR-CON) 20 MEQ tablet, Take 2 tablets (40 mEq total) by mouth 3 (three) times daily. Take an extra 2 tabs every dose of metolazone, Disp: 360 tablet, Rfl: 3 No Known Allergies    Social History   Socioeconomic History  . Marital status: Single    Spouse name: Not on file  . Number of children: Not on file  . Years of education: Not on file  . Highest education level: Not on file  Occupational History  . Not on file  Tobacco Use  . Smoking status: Former Research scientist (life sciences)  . Smokeless tobacco: Never Used  Substance and Sexual Activity  . Alcohol use: Not Currently  . Drug use: Not Currently  . Sexual activity: Not Currently  Other Topics Concern  . Not on file  Social History Narrative  . Not on file   Social Determinants of Health   Financial Resource Strain:   . Difficulty of Paying Living Expenses: Not on file  Food Insecurity:   . Worried About Charity fundraiser in the Last Year: Not on file  . Ran Out of Food in the Last Year: Not on file  Transportation Needs:   . Lack of Transportation (Medical): Not on file  . Lack of Transportation (Non-Medical): Not on file  Physical Activity:   . Days of Exercise per Week: Not on file  . Minutes of Exercise per Session: Not on file  Stress:   . Feeling of Stress : Not on file  Social Connections:   . Frequency of Communication with Friends and Family: Not on file  . Frequency of Social Gatherings with Friends and Family: Not on file  . Attends Religious Services: Not on file  . Active Member of Clubs or Organizations: Not on file   . Attends Archivist Meetings: Not on file  . Marital Status: Not on file  Intimate Partner Violence:   . Fear of Current or Ex-Partner: Not on file  . Emotionally Abused: Not on file  . Physically Abused: Not on file  . Sexually Abused: Not on file    Physical Exam      Future Appointments  Date Time Provider [place]  09/30/2019  1:30 PM [name], NP [facility] None  10/08/2019 10:30 AM [facility] PA/NP [facility] None    BP (!) 190/0   Pulse 88   Temp 97.8 F (36.6 C)   Resp 18   Wt (!) 398 lb (180.5 kg)  SpO2 95%   BMI 55.51 kg/m  [facility]G EMS-122 Weight yesterday-398 Last visit weight-397  Pt reports he is doing ok, he went to his sisters for the new year and he did eat some fried chicken and his feet did swell some but he got it back down now.  There was a lot of issues at his boarding house this weekend and the one guy that has a lot of parties and drugs/weapons is suppose to be told he had to leave today and the [employer] had contacted the landlord to have this done.  He will get moms meals delivery on 1/6.  He has not eaten yet this morning so no meds yet this morning.   Needs potassium/spiro/labetalol/omeprazole in pill box.  Will get those from pharmacy and bring it back out.  He does have edema to his leg and feet. He was not home last night and he didn't use his cpap. He fell asleep sitting in chair during our visit. I told him to go lay down and put on his CPAP.  He doesn't use his CPAP when he is here alone due to him going into a deep sleep and when he wakes up so many people are here so it sounds like a safety thing with him for the environment use for the lack of CPAP use.  meds verified and pill box filled. He denies increased sob. No dizziness. No c/p.   [name], [facility] Paramedic  09/23/19

## 2019-09-23 NOTE — ED Notes (Signed)
Pt called for recheck VS x2, no response.  

## 2019-09-23 NOTE — ED Triage Notes (Addendum)
Pt here via ems states fell thia afternoon hit his head states unknon loc hit his head on wall and flank, found on floor  At boarding home, pt is on home home o2 at 2 l [place] states has been falling  A lot lately pt aaox4 now , able to get himself to BR and back in chair without falling

## 2019-09-24 ENCOUNTER — Other Ambulatory Visit (HOSPITAL_COMMUNITY): Payer: Self-pay

## 2019-09-24 NOTE — ED Notes (Signed)
Pt. has been called and no responded.

## 2019-09-26 ENCOUNTER — Ambulatory Visit (HOSPITAL_COMMUNITY)
Admission: RE | Admit: 2019-09-26 | Discharge: 2019-09-26 | Disposition: A | Discharge status: Home / Self Care | Payer: Medicaid Other | Source: Ambulatory Visit | Attending: Cardiology | Admitting: Cardiology

## 2019-09-26 ENCOUNTER — Encounter (HOSPITAL_COMMUNITY): Payer: Self-pay | Admitting: *Deleted

## 2019-09-26 ENCOUNTER — Encounter (HOSPITAL_COMMUNITY): Payer: Self-pay | Admitting: Cardiology

## 2019-09-26 ENCOUNTER — Encounter (HOSPITAL_COMMUNITY): Payer: Medicaid Other

## 2019-09-26 ENCOUNTER — Other Ambulatory Visit: Payer: Self-pay

## 2019-09-26 VITALS — BP 150/121 | HR 93 | Wt >= 6400 oz

## 2019-09-26 DIAGNOSIS — D649 Anemia, unspecified: Secondary | ICD-10-CM | POA: Insufficient documentation

## 2019-09-26 DIAGNOSIS — I480 Paroxysmal atrial fibrillation: Secondary | ICD-10-CM | POA: Insufficient documentation

## 2019-09-26 DIAGNOSIS — Z79899 Other long term (current) drug therapy: Secondary | ICD-10-CM | POA: Diagnosis not present

## 2019-09-26 DIAGNOSIS — R55 Syncope and collapse: Secondary | ICD-10-CM

## 2019-09-26 DIAGNOSIS — G4733 Obstructive sleep apnea (adult) (pediatric): Secondary | ICD-10-CM | POA: Diagnosis not present

## 2019-09-26 DIAGNOSIS — K219 Gastro-esophageal reflux disease without esophagitis: Secondary | ICD-10-CM | POA: Diagnosis not present

## 2019-09-26 DIAGNOSIS — I5033 Acute on chronic diastolic (congestive) heart failure: Secondary | ICD-10-CM | POA: Diagnosis not present

## 2019-09-26 DIAGNOSIS — Z7984 Long term (current) use of oral hypoglycemic drugs: Secondary | ICD-10-CM | POA: Insufficient documentation

## 2019-09-26 DIAGNOSIS — Z7901 Long term (current) use of anticoagulants: Secondary | ICD-10-CM | POA: Diagnosis not present

## 2019-09-26 DIAGNOSIS — I5032 Chronic diastolic (congestive) heart failure: Secondary | ICD-10-CM | POA: Diagnosis not present

## 2019-09-26 DIAGNOSIS — I110 Hypertensive heart disease with heart failure: Secondary | ICD-10-CM | POA: Diagnosis not present

## 2019-09-26 DIAGNOSIS — E119 Type 2 diabetes mellitus without complications: Secondary | ICD-10-CM | POA: Insufficient documentation

## 2019-09-26 DIAGNOSIS — Z86711 Personal history of pulmonary embolism: Secondary | ICD-10-CM | POA: Diagnosis not present

## 2019-09-26 DIAGNOSIS — Z8616 Personal history of COVID-19: Secondary | ICD-10-CM | POA: Insufficient documentation

## 2019-09-26 DIAGNOSIS — Z87891 Personal history of nicotine dependence: Secondary | ICD-10-CM | POA: Insufficient documentation

## 2019-09-26 DIAGNOSIS — Z8249 Family history of ischemic heart disease and other diseases of the circulatory system: Secondary | ICD-10-CM | POA: Insufficient documentation

## 2019-09-26 LAB — BASIC METABOLIC PANEL
Anion gap: 11 (ref 5–15)
BUN: 28 mg/dL — ABNORMAL HIGH (ref 6–20)
CO2: 33 mmol/L — ABNORMAL HIGH (ref 22–32)
Calcium: 8.5 mg/dL — ABNORMAL LOW (ref 8.9–10.3)
Chloride: 93 mmol/L — ABNORMAL LOW (ref 98–111)
Creatinine, Ser: 1.37 mg/dL — ABNORMAL HIGH (ref 0.61–1.24)
GFR calc Af Amer: >60 mL/min (ref >60)
GFR calc non Af Amer: 58 mL/min — ABNORMAL LOW (ref >60)
Glucose, Bld: 145 mg/dL — ABNORMAL HIGH (ref 70–99)
Potassium: 3.2 mmol/L — ABNORMAL LOW (ref 3.5–5.1)
Sodium: 137 mmol/L (ref 135–145)

## 2019-09-26 LAB — MAGNESIUM: Magnesium: 1.4 mg/dL — ABNORMAL LOW (ref 1.7–2.4)

## 2019-09-26 MED ORDER — LOSARTAN POTASSIUM 50 MG PO TABS: 50.0000 mg | ORAL_TABLET | Freq: Every day | ORAL | 11 refills | Status: DC

## 2019-09-26 NOTE — Progress Notes (Signed)
Advanced Heart Failure Clinic Note   Referring Physician: PCP: [name], NP PCP-Cardiologist: Dorris Carnes, MD  [facility]: Dr. Aundra Dubin   HPI:  [name] a 54 yo with hx of diastolic CHF, HTN, DM, Atrial fib, PE in April 2020 (on Xarelto), OSA, GERD,chronic elevation of troponin.  Admitted 01/21/2019 with increased dyspnea and had PE. He was anticoagulated.   Patient re-admitted 5/20 for syncope/cough/dyspnea, COVID+, CT positive for bilateral GGOs, admitted to [facility] and discharged after 6 days, on O2. Rec'd Cards f/u.  Patient re-admitted 02/20/19 for bright red blood per rectum, eval'ed by GI, had EGD/colon, dx'd with hemorrhoidal bleeding, discharged after 9 days on O2.He was again Covid + .   Readmitted 03/11/19 with increased shortness of breath. He did not have home oxygen. He was discharged to home the next day.  He presented to [facility] ED7/23/20with lower extremity edema. COVID negative. Diuresed with lasix drip and transitioned to torsemide 40 mg twice daily. Had [place] with mild volume overload and preserved cardiac output. Discharge weight was 407.9 pounds.   He was readmitted with symptomatic anemia 05/28/19 with hgb 7.1. Received 1UPRBCs. GI consulted. He started on anusol and continued on stool softner.  He had f/u visit 10/20 and was complaining of increased leg edema and weight gain. SOB with exertion. DeniedPND. + Orthopnea. Nobleeding issues.Was eating high sodium food -->Bo jangles chicken. No fever or chills. Taking all medications. Followed closely by HF Paramedicine.He was given a dose of IV Lasix in clinic, 80 mg w/ >1300 urine output after IV lasix.His home torsemide dose was increased to 100 bid and he was instructed to f/u in 2 weeks.   He was readmitted to the hospital on 07/16/19 when he presented to the ED w/ worsening symptoms and was admitted by IM for a/c diastolic CHF and treated w/ IV Lasix. Had 2 day hospital stay and was discharged on 10/29.  Echo was done 10/28 and showed normal LVEF 65-70%, RV interpreted as normal but suspect significant RV dysfunction.  After diuresis w/ IV Lasix, he was placed back on home diuretic regimen, torsemide 100 mg bid. Lisinopril 5 mg was also added to regimen. AHF team was not consulted that admit.  He was seen back in clinic 07/30/19. Felt poorly. SOB at rest and worse w/ exertion. Also w/ orthopnea/PND. Significant wt gain of 20 lb up to 428 lb (dry wt ~407 lb), w/ marked abdominal distention and LEE and poor response to IV diuretics. He was directly admitted from clinic to [facility] for IV diuretics.  He was placed on IV Lasix 80 mg bid w/ great response. PICC line was placed to measure CVP. Net diuresis over 15 L. Diuresed down from admit wt of 427 lb to dry wt of 408 lb at discharge. CVP down to 8-9. He was transitioned back to PO torsemide 100 mg bid. He had slight bump in SCr to 1.2 and lisinopril was discontinued. Labetalol increased to 400 mg bid. Spironolactone was also added.   Patient returns for followup of CHF.  Weight is up but he reports minimal dyspnea.  No problems walking around the house, not very active otherwise.  He continues to wear home oxygen but not using his CPAP regularly.  He says that he does not feel safe where he lives sleeping as soundly as CPAP makes him sleep, says he lives near a "bunch of hoodlums."  He has episodes of "falling out," not sure if he is falling asleep.  He says his girlfriend says  that he will fall asleep suddenly. BP remains high.   Labs (11/20): BNP 32, K 3.6, creatinine 1.09 Labs (12/20): K 3.3, creatinine 1.28  ECG (11/20): NSR, normal (personally reviewed).   Review of Systems: All systems reviewed and negative except as per HPI.   PMH: 1. Atrial fibrillation: Paroxysmal.  2. Pulmonary embolus: 5/20.  3. OHS/OSA: He is on home oxygen during the day and uses CPAP at night.  4. Morbid obesity.  5. Chronic diastolic CHF:  - RHC (8/84): mean RA 12, PA  40/25 mean 31, mean PCWP 23, CI 2.47, PVR 1.1 WU.  - Echo (10/20): EF 65-70%, mild LVH, normal RV size and systolic function.  6. Type 2 diabetes 7. HTN 8. Rectal bleeding: ?Hemorrhoidal.  9. COVID-19 infection 7/20.   Current Outpatient Medications  Medication Sig Dispense Refill  . albuterol (VENTOLIN HFA) 108 (90 Base) MCG/ACT inhaler Inhale 2 puffs into the lungs every 6 (six) hours as needed for wheezing or shortness of breath. 8.5 g 2  . amLODipine (NORVASC) 10 MG tablet Take 1 tablet (10 mg total) by mouth daily. 90 tablet 1  . atorvastatin (LIPITOR) 20 MG tablet Take 1 tablet (20 mg total) by mouth at bedtime. 90 tablet 2  . Blood Glucose Monitoring Suppl (TRUE METRIX METER) w/Device KIT Use as instructed. Check blood glucose level by fingerstick twice per day. 1 kit 0  . carboxymethylcellulose (REFRESH PLUS) 0.5 % SOLN Place 1 drop into both eyes daily.     Marland Kitchen docusate sodium (COLACE) 100 MG capsule Take 1 capsule (100 mg total) by mouth 2 (two) times daily. 180 capsule 1  . ferrous sulfate 325 (65 FE) MG tablet Take 1 tablet (325 mg total) by mouth daily. Patient will pick up scripts today. 90 tablet 0  . glucose blood (TRUE METRIX BLOOD GLUCOSE TEST) test strip Use as instructed. Check blood glucose level by fingerstick twice per day. 100 each 12  . labetalol (NORMODYNE) 200 MG tablet Take 2 tablets (400 mg total) by mouth 2 (two) times daily. 120 tablet 3  . levalbuterol (XOPENEX HFA) 45 MCG/ACT inhaler Inhale 2 puffs into the lungs every 4 (four) hours as needed for wheezing.     Marland Kitchen losartan (COZAAR) 50 MG tablet Take 1 tablet (50 mg total) by mouth daily. 30 tablet 11  . metFORMIN (GLUCOPHAGE) 500 MG tablet Take 1 tablet (500 mg total) by mouth 2 (two) times daily with a meal. Patient will pick up scripts today. 180 tablet 1  . metolazone (ZAROXOLYN) 5 MG tablet Take 1 tablet every Monday, Wednesday, and Friday. 15 tablet 3  . Misc. Devices MISC Requires O2 @ 3L/min continuously via  nasal canula and home fill system,. 1 each 0  . Misc. Devices MISC Please provide Benjamin [name] with insurance approved portable O2 concentrator ICD 10 J96.11 Z99.81 1 each 0  . omeprazole (PRILOSEC) 20 MG capsule Take 1 capsule (20 mg total) by mouth daily. 30 capsule 3  . potassium chloride SA (KLOR-CON) 20 MEQ tablet Take 2 tablets (40 mEq total) by mouth 3 (three) times daily. Take an extra 2 tabs every dose of metolazone 360 tablet 3  . rivaroxaban (XARELTO) 20 MG TABS tablet Take 1 tablet (20 mg total) by mouth daily with supper. 60 tablet 6  . spironolactone (ALDACTONE) 25 MG tablet Take 1 tablet (25 mg total) by mouth daily. 30 tablet 3  . torsemide (DEMADEX) 100 MG tablet Take 1 tablet (100 mg total) by mouth 2 (  two) times daily. 180 tablet 3  . TRUEplus Lancets 28G MISC Use as instructed. Check blood glucose level by fingerstick twice per day. 200 each 3   No current facility-administered medications for this encounter.    No Known Allergies    Social History   Socioeconomic History  . Marital status: Single    Spouse name: Not on file  . Number of children: Not on file  . Years of education: Not on file  . Highest education level: Not on file  Occupational History  . Not on file  Tobacco Use  . Smoking status: Former Research scientist (life sciences)  . Smokeless tobacco: Never Used  Substance and Sexual Activity  . Alcohol use: Not Currently  . Drug use: Not Currently  . Sexual activity: Not Currently  Other Topics Concern  . Not on file  Social History Narrative  . Not on file   Social Determinants of Health   Financial Resource Strain:   . Difficulty of Paying Living Expenses: Not on file  Food Insecurity:   . Worried About Charity fundraiser in the Last Year: Not on file  . Ran Out of Food in the Last Year: Not on file  Transportation Needs:   . Lack of Transportation (Medical): Not on file  . Lack of Transportation (Non-Medical): Not on file  Physical Activity:   . Days of  Exercise per Week: Not on file  . Minutes of Exercise per Session: Not on file  Stress:   . Feeling of Stress : Not on file  Social Connections:   . Frequency of Communication with Friends and Family: Not on file  . Frequency of Social Gatherings with Friends and Family: Not on file  . Attends Religious Services: Not on file  . Active Member of Clubs or Organizations: Not on file  . Attends Archivist Meetings: Not on file  . Marital Status: Not on file  Intimate Partner Violence:   . Fear of Current or Ex-Partner: Not on file  . Emotionally Abused: Not on file  . Physically Abused: Not on file  . Sexually Abused: Not on file      Family History  Problem Relation Age of Onset  . Hypertension Mother   . Diabetes Mother     Vitals:   09/26/19 0904  BP: (!) 150/121  Pulse: 93  SpO2: 98%  Weight: (!) 185.8 kg (409 lb 9.6 oz)     PHYSICAL EXAM: General: NAD, obese.  Neck: Thick, no JVD, no thyromegaly or thyroid nodule.  Lungs: Clear to auscultation bilaterally with normal respiratory effort. CV: Nondisplaced PMI.  Heart regular S1/S2, no S3/S4, no murmur.  No peripheral edema.  No carotid bruit.  Normal pedal pulses.  Abdomen: Soft, nontender, no hepatosplenomegaly, no distention.  Skin: Intact without lesions or rashes.  Neurologic: Alert and oriented x 3.  Psych: Normal affect. Extremities: No clubbing or cyanosis.  HEENT: Normal.   ASSESSMENT & PLAN:  1. Chronic diastolic CHF: Echo in 12/19 with EF 65-70%, RV interpreted as normal but suspect significant RV dysfunction. Volume looks ok today on current regimen.  - Continue torsemide 100 mg bid and metolazone 2.5 mg three times a week.  - He will continue KCl 40 tid but will take an extra 20 KCl on metolazone days.  - BMET today  - Continue spironolactone 25 mg daily.  - Continue w/ Paramedicine  2. Atrial fibrillation: Paroxysmal. NSR recently.  - Continue Xarelto.  3. H/o PE: 5/20, continue  Xarelto.    4. OHS/OSA: Continue CPAP at night and oxygen during the day.I strongly urged him to use his CPAP, he has not been using recently.  - Will refer to pulmonary rehab.  5. HTN: Increase losartan to 50 mg daily, BMET 10 days.  6. "Falling out" spells: Sounds like he may be falling asleep suddenly since he has stopped using CPAP.  - I strongly encouraged him to restart CPAP.  - I will place 3 week Zio patch to assess for arrhythmias.   Followup in 1 month with NP/PA.   Loralie Champagne 09/26/2019 1:41 PM

## 2019-09-26 NOTE — Progress Notes (Signed)
Received referral from Dr. Shirlee Latch for this pt to participate in Pulmonary Rehab with the diagnosis of diastolic heart failure.  Pt seen in follow up today in the HF clinic.  Await completion progress note.  Pt bp listed on AV summary remain quite elevated for exercise.  Would like for bp management optimized.  At present we have been directed by senior leadership to pause onsite Pulmonary rehab in response to the surge of inpatient Covid + patients. Patients are offered virtual pulmonary rehab where they are able to download an appt to their smart device and log their exercise and any other vital signs. Pt has upcoming appt with his primary MD- [name] on 1/11.  Would like for pt to be medically stable prior to initiation of unsupervised exercise.  Will continue to monitor. Alanson Aly, BSN Cardiac and Emergency planning/management officer

## 2019-09-26 NOTE — Patient Instructions (Signed)
INCREASE Losartan 50mg  (1 tab) daily  You have been referred to Pulmonary Rehab.  They will call you to schedule your appointment. Please call if you do not get a call in 1 - 2 weeks  Your provider has recommended that  you wear a Zio Patch for 14 days.  This monitor will record your heart rhythm for our review.  IF you have any symptoms while wearing the monitor please press the button.  If you have any issues with the patch or you notice a red or orange light on it please call the company at [phone].  Once you remove the patch please mail it back to the company as soon as possible so we can get the results.  Labs today and repeat in 2 weeks We will only contact you if something comes back abnormal or we need to make some changes. Otherwise no news is good news!  Your physician recommends that you schedule a follow-up appointment in: 1 month with the Nurse Practitioner  Please call office at [phone] option 2 if you have any questions or concerns.   At the Advanced [facility] Failure Clinic, you and your health needs are our priority. As part of our continuing mission to provide you with exceptional heart care, we have created designated Provider Care Teams. These Care Teams include your primary Cardiologist (physician) and Advanced Practice Providers (APPs- Physician Assistants and Nurse Practitioners) who all work together to provide you with the care you need, when you need it.   You may see any of the following providers on your designated Care Team at your next follow up: [phone] Dr Marland Kitchen . Dr Arvilla Meres . Marca Ancona, NP . Tonye Becket, PA . Robbie Lis, PharmD   Please be sure to bring in all your medications bottles to every appointment.

## 2019-09-27 ENCOUNTER — Telehealth (HOSPITAL_COMMUNITY): Payer: Self-pay

## 2019-09-27 ENCOUNTER — Telehealth (HOSPITAL_COMMUNITY): Payer: Self-pay | Admitting: *Deleted

## 2019-09-27 DIAGNOSIS — I5032 Chronic diastolic (congestive) heart failure: Secondary | ICD-10-CM

## 2019-09-27 DIAGNOSIS — I10 Essential (primary) hypertension: Secondary | ICD-10-CM

## 2019-09-27 DIAGNOSIS — E876 Hypokalemia: Secondary | ICD-10-CM

## 2019-09-27 MED ORDER — POTASSIUM CHLORIDE CRYS ER 20 MEQ PO TBCR: EXTENDED_RELEASE_TABLET | ORAL | 3 refills | Status: DC

## 2019-09-27 MED ORDER — MAGNESIUM OXIDE 400 MG PO TABS: 400.0000 mg | ORAL_TABLET | Freq: Every day | ORAL | 6 refills | Status: DC

## 2019-09-27 NOTE — Telephone Encounter (Signed)
Attempted to contact patient for Exercise and Wellness Follow-up with no answer and no voicemail available. Patient being seen by paramedic. Will follow-up again next week.     Lesia Hausen, MS, ACSM-RCEP Clinical Exercise Physiologist

## 2019-09-27 NOTE — Telephone Encounter (Signed)
-----   Message from Laurey Morale, MD sent at 09/26/2019  3:54 PM EST ----- 1. Start magnesium oxide 400 mg daily.  2. Increase total daily KCl by 20 mEq.   3. BMET and Mg check in 2 wks.

## 2019-09-27 NOTE — Telephone Encounter (Signed)
Pt aware of lab results.  Advised to start mag ox 400 daily and increase kcl by 1 tab.  Will rtc in 2 weeks for blood wok.  Verbalized understanding. LM for paramed Katie to make her aware of med changes.

## 2019-09-30 ENCOUNTER — Telehealth: Payer: Self-pay

## 2019-09-30 ENCOUNTER — Other Ambulatory Visit (HOSPITAL_COMMUNITY): Payer: Self-pay

## 2019-09-30 ENCOUNTER — Ambulatory Visit: Payer: Medicaid Other | Admitting: Nurse Practitioner

## 2019-09-30 NOTE — Progress Notes (Signed)
Paramedicine Encounter    Patient ID: Benjamin [name], male    DOB: Jul 17, 1966, 54 y.o.   MRN: [id number]   Patient Care Team: [name], NP as PCP - General (Nurse Practitioner) [name], MD as PCP - Cardiology (Cardiology) [name], LCSW as Social Worker (Licensed Clinical Social Worker)  Patient Active Problem List   Diagnosis Date Noted  . Acute on chronic diastolic CHF (congestive heart failure), NYHA class 3 ([place]) 07/30/2019  . Diabetes mellitus without complication ([place]) 52/77/8242  . Hypertensive emergency   . BRBPR (bright red blood per rectum) 06/12/2019  . Hypertension   . CHF (congestive heart failure) ([place])   . Sleep apnea   . Chronic respiratory failure ([place])   . Hypokalemia 05/29/2019  . GI bleed 05/29/2019  . Anemia 05/28/2019  . Acute on chronic diastolic CHF (congestive heart failure) ([place]) 04/11/2019  . Iron deficiency anemia 03/11/2019  . OSA on CPAP 03/11/2019  . HLD (hyperlipidemia) 03/11/2019  . Elevated troponin 03/11/2019  . GERD (gastroesophageal reflux disease) 03/11/2019  . Rectal bleeding 02/20/2019  . Dyspnea 02/06/2019  . COVID-19 virus infection 02/06/2019  . Bilateral lower extremity edema   . Morbid obesity with BMI of 50.0-59.9, adult ([place])   . Chronic diastolic CHF (congestive heart failure) ([place])   . PAF (paroxysmal atrial fibrillation) ([place])   . PE (pulmonary thromboembolism) ([place]) 01/21/2019  . Hypertensive urgency 01/21/2019  . Diabetes mellitus type 2 in obese ([place]) 01/21/2019    Current Outpatient Medications:  .  albuterol (VENTOLIN HFA) 108 (90 Base) MCG/ACT inhaler, Inhale 2 puffs into the lungs every 6 (six) hours as needed for wheezing or shortness of breath., Disp: 8.5 g, Rfl: 2 .  Blood Glucose Monitoring Suppl (TRUE METRIX METER) w/Device KIT, Use as instructed. Check blood glucose level by fingerstick twice per day., Disp: 1 kit, Rfl: 0 .  carboxymethylcellulose (REFRESH PLUS) 0.5 % SOLN, Place 1 drop  into both eyes daily. , Disp: , Rfl:  .  glucose blood (TRUE METRIX BLOOD GLUCOSE TEST) test strip, Use as instructed. Check blood glucose level by fingerstick twice per day., Disp: 100 each, Rfl: 12 .  labetalol (NORMODYNE) 200 MG tablet, Take 2 tablets (400 mg total) by mouth 2 (two) times daily., Disp: 120 tablet, Rfl: 3 .  losartan (COZAAR) 50 MG tablet, Take 1 tablet (50 mg total) by mouth daily., Disp: 30 tablet, Rfl: 11 .  metolazone (ZAROXOLYN) 5 MG tablet, Take 1 tablet every Monday, Wednesday, and Friday., Disp: 15 tablet, Rfl: 3 .  Misc. Devices MISC, Requires O2 @ 3L/min continuously via nasal canula and home fill system,., Disp: 1 each, Rfl: 0 .  Misc. Devices MISC, Please provide Mr. Frisbie with insurance approved portable O2 concentrator ICD 10 J96.11 Z99.81, Disp: 1 each, Rfl: 0 .  omeprazole (PRILOSEC) 20 MG capsule, Take 1 capsule (20 mg total) by mouth daily., Disp: 30 capsule, Rfl: 3 .  potassium chloride SA (KLOR-CON) 20 MEQ tablet, Take 60 meq (3 tabs) in the morning, 40 meq (2 tabs) in the afternoon and 40 meq (2 tabs) in the evening.  Take an extra 2 tabs every dose of metolazone, Disp: 360 tablet, Rfl: 3 .  rivaroxaban (XARELTO) 20 MG TABS tablet, Take 1 tablet (20 mg total) by mouth daily with supper., Disp: 60 tablet, Rfl: 6 .  spironolactone (ALDACTONE) 25 MG tablet, Take 1 tablet (25 mg total) by mouth daily., Disp: 30 tablet, Rfl: 3 .  torsemide (DEMADEX)  100 MG tablet, Take 1 tablet (100 mg total) by mouth 2 (two) times daily., Disp: 180 tablet, Rfl: 3 .  TRUEplus Lancets 28G MISC, Use as instructed. Check blood glucose level by fingerstick twice per day., Disp: 200 each, Rfl: 3 .  amLODipine (NORVASC) 10 MG tablet, Take 1 tablet (10 mg total) by mouth daily., Disp: 90 tablet, Rfl: 1 .  atorvastatin (LIPITOR) 20 MG tablet, Take 1 tablet (20 mg total) by mouth at bedtime., Disp: 90 tablet, Rfl: 2 .  ferrous sulfate 325 (65 FE) MG tablet, Take 1 tablet (325 mg total) by  mouth daily. Patient will pick up scripts today., Disp: 90 tablet, Rfl: 0 .  levalbuterol (XOPENEX HFA) 45 MCG/ACT inhaler, Inhale 2 puffs into the lungs every 4 (four) hours as needed for wheezing. , Disp: , Rfl:  .  magnesium oxide (MAG-OX) 400 MG tablet, Take 1 tablet (400 mg total) by mouth daily. (Patient not taking: Reported on 09/30/2019), Disp: 30 tablet, Rfl: 6 .  metFORMIN (GLUCOPHAGE) 500 MG tablet, Take 1 tablet (500 mg total) by mouth 2 (two) times daily with a meal. Patient will pick up scripts today., Disp: 180 tablet, Rfl: 1 No Known Allergies    Social History   Socioeconomic History  . Marital status: Single    Spouse name: Not on file  . Number of children: Not on file  . Years of education: Not on file  . Highest education level: Not on file  Occupational History  . Not on file  Tobacco Use  . Smoking status: Former Research scientist (life sciences)  . Smokeless tobacco: Never Used  Substance and Sexual Activity  . Alcohol use: Not Currently  . Drug use: Not Currently  . Sexual activity: Not Currently  Other Topics Concern  . Not on file  Social History Narrative  . Not on file   Social Determinants of Health   Financial Resource Strain:   . Difficulty of Paying Living Expenses: Not on file  Food Insecurity:   . Worried About Charity fundraiser in the Last Year: Not on file  . Ran Out of Food in the Last Year: Not on file  Transportation Needs:   . Lack of Transportation (Medical): Not on file  . Lack of Transportation (Non-Medical): Not on file  Physical Activity:   . Days of Exercise per Week: Not on file  . Minutes of Exercise per Session: Not on file  Stress:   . Feeling of Stress : Not on file  Social Connections:   . Frequency of Communication with Friends and Family: Not on file  . Frequency of Social Gatherings with Friends and Family: Not on file  . Attends Religious Services: Not on file  . Active Member of Clubs or Organizations: Not on file  . Attends Theatre manager Meetings: Not on file  . Marital Status: Not on file  Intimate Partner Violence:   . Fear of Current or Ex-Partner: Not on file  . Emotionally Abused: Not on file  . Physically Abused: Not on file  . Sexually Abused: Not on file    Physical Exam      Future Appointments  Date Time Provider [place]  10/10/2019  9:30 AM [facility] LAB [facility] None  10/28/2019  9:30 AM [facility] PA/NP [facility] None    BP (!) 182/0   Pulse 78   Temp 97.8 F (36.6 C)   Resp 20   Wt (!) 398 lb (180.5 kg)   SpO2 97%  BMI 55.51 kg/m   Weight yesterday-398 Last visit weight-409 @ clinic  Pt reports he is doing ok.  His zio-patch fell off yesterday. I spoke to jasmine with clinic and she said just send it in so that was sealed up and will be placed in the mailbox.  He had med changes last week at clinic, those are made today.  Pt reports feeling ok, he did have to hit the button on the zio patch he said about 8-10 times but didn't record it in the book.  meds verified and pill box refilled.  He did miss last thursdays dose and he hasnt taken his meds yet this morning.  He states he is suppose to get a b/p machine but his PCP appointment got cancelled.  I contacted jane and she will look into the order being placed and see if he needs to f/u for the missed appointment.  Looks like his pulm rehab is on hold b/c of covid and b/p still too elevated for virtual exercise unsupervised. He said he has been waking up with his pillow soaking wet-he is not using his CPAP every night--urged him to use it nightly.  I will go by and p/u his increased dose of losartan, magnesium and his inhaler at pharmacy.   [name], [facility] Paramedic  09/30/19

## 2019-10-01 ENCOUNTER — Other Ambulatory Visit: Payer: Self-pay | Admitting: Nurse Practitioner

## 2019-10-01 MED ORDER — BLOOD PRESSURE MONITOR DEVI: 0 refills | Status: DC

## 2019-10-02 ENCOUNTER — Other Ambulatory Visit (HOSPITAL_COMMUNITY): Payer: Self-pay

## 2019-10-02 ENCOUNTER — Telehealth (HOSPITAL_COMMUNITY): Payer: Self-pay | Admitting: *Deleted

## 2019-10-02 NOTE — Telephone Encounter (Signed)
  Previous Weeks:  - He wants to lose 10 lbs by the end of January  o Download app on phone to track distance and time walked at track in neighborhood o Walk track 4 days out of the next 7 days  o Watching sodium in food content and no fast food  o Download app on phone to track distance and time walked at track in neighborhood--- Joellen [place] will help o Screen shot exercise summary and send picture to CSW   Progress Note: - Today's weight: 395 lbs - Patient has been moving around but cannot quantify the activity because he has been too busy to work with the app on the phone. He has been forgetting to set up the app. - He says "I have been doing more walking and moving around than when I was intentionally exercising. These people are nasty in here and I am constantly cleaning." - He says he is able to stand up and wash dishes for more than 10 minutes and other activities because he is wearing his oxygen most of the time now.  - He has been doing arm circles in his chair and leg lifts/extensions   Achievements: - He has almost met his 10 lbs by the end of January and is encouraged and motivated to keep going.  - He can breathe better with his portable oxygen ("that's my baby") - He can walk all the way through the grocery store - He can move around and do things he needs to do without stopping - Today he was moving around and talking with minimal shortness of breath - He went up 3 flights of stairs without feeling too winded and without having to stop.  - His legs and feet do not hurt as bad anymore - Using his CPAP again!  - He showed a positive attitude about cleaning the house and using it as exercise - He also expressed much patience and gratitude for his next housing assignment solution  Challenges/Frustrations: - He wants to get out of his boarding house (currently working with [place], [name] - Having to clean all day/everyday after his boarding mates in the house- he does not like clutter  "junk" - He wants to lose more weight  - The safety in his house   New Weekly Goal (s): - Continue losing weight (wants to lose 80 lbs total- aiming for 320 - Wants to stop relying on O2 so much  - Walk for 30 minutes (Monday Wednesday Thursday)  - Familiarize himself with his [employer] App   Next Session: Wednesday 10/09/19 via Telephone in the am hours-    Time Spent Coaching Patient: 35 minutes     Landis Martins, MS, ACSM-RCEP Clinical Exercise Physiologist

## 2019-10-02 NOTE — Progress Notes (Signed)
Med rec post pharmacy p/u for pt, I took him his inhaler, magnesium and new dose of losartan and placed in pill box where needed.   Kerry Hough, EMT-Paramedic  10/02/19

## 2019-10-04 ENCOUNTER — Other Ambulatory Visit: Payer: Self-pay

## 2019-10-04 ENCOUNTER — Telehealth (HOSPITAL_COMMUNITY): Payer: Self-pay | Admitting: Licensed Clinical Social Worker

## 2019-10-04 DIAGNOSIS — I10 Essential (primary) hypertension: Secondary | ICD-10-CM

## 2019-10-04 MED ORDER — BLOOD PRESSURE MONITOR DEVI: 0 refills | Status: DC

## 2019-10-04 NOTE — Telephone Encounter (Signed)
Opened in error
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
## 2019-10-04 NOTE — Progress Notes (Signed)
Reorder patient BP monitor.

## 2019-10-07 ENCOUNTER — Other Ambulatory Visit (HOSPITAL_COMMUNITY): Payer: Self-pay

## 2019-10-07 NOTE — Progress Notes (Signed)
Paramedicine Encounter    Patient ID: Benjamin [name], male    DOB: 1965-10-21, 54 y.o.   MRN: [id number]   Patient Care Team: [name], NP as PCP - General (Nurse Practitioner) [name], MD as PCP - Cardiology (Cardiology) [name], LCSW as Social Worker (Licensed Clinical Social Worker)  Patient Active Problem List   Diagnosis Date Noted  . Acute on chronic diastolic CHF (congestive heart failure), NYHA class 3 ([place]) 07/30/2019  . Diabetes mellitus without complication ([place]) 37/90/2409  . Hypertensive emergency   . BRBPR (bright red blood per rectum) 06/12/2019  . Hypertension   . CHF (congestive heart failure) ([place])   . Sleep apnea   . Chronic respiratory failure ([place])   . Hypokalemia 05/29/2019  . GI bleed 05/29/2019  . Anemia 05/28/2019  . Acute on chronic diastolic CHF (congestive heart failure) ([place]) 04/11/2019  . Iron deficiency anemia 03/11/2019  . OSA on CPAP 03/11/2019  . HLD (hyperlipidemia) 03/11/2019  . Elevated troponin 03/11/2019  . GERD (gastroesophageal reflux disease) 03/11/2019  . Rectal bleeding 02/20/2019  . Dyspnea 02/06/2019  . COVID-19 virus infection 02/06/2019  . Bilateral lower extremity edema   . Morbid obesity with BMI of 50.0-59.9, adult ([place])   . Chronic diastolic CHF (congestive heart failure) ([place])   . PAF (paroxysmal atrial fibrillation) ([place])   . PE (pulmonary thromboembolism) ([place]) 01/21/2019  . Hypertensive urgency 01/21/2019  . Diabetes mellitus type 2 in obese ([place]) 01/21/2019    Current Outpatient Medications:  .  albuterol (VENTOLIN HFA) 108 (90 Base) MCG/ACT inhaler, Inhale 2 puffs into the lungs every 6 (six) hours as needed for wheezing or shortness of breath., Disp: 8.5 g, Rfl: 2 .  Blood Glucose Monitoring Suppl (TRUE METRIX METER) w/Device KIT, Use as instructed. Check blood glucose level by fingerstick twice per day., Disp: 1 kit, Rfl: 0 .  Blood Pressure Monitor DEVI, Please provide patient with  insurance approved blood pressure device with L-XL cuff. BMI 55, Disp: 1 each, Rfl: 0 .  carboxymethylcellulose (REFRESH PLUS) 0.5 % SOLN, Place 1 drop into both eyes daily. , Disp: , Rfl:  .  docusate sodium (COLACE) 100 MG capsule, Take 100 mg by mouth 2 (two) times daily., Disp: , Rfl:  .  glucose blood (TRUE METRIX BLOOD GLUCOSE TEST) test strip, Use as instructed. Check blood glucose level by fingerstick twice per day., Disp: 100 each, Rfl: 12 .  labetalol (NORMODYNE) 200 MG tablet, Take 2 tablets (400 mg total) by mouth 2 (two) times daily., Disp: 120 tablet, Rfl: 3 .  levalbuterol (XOPENEX HFA) 45 MCG/ACT inhaler, Inhale 2 puffs into the lungs every 4 (four) hours as needed for wheezing. , Disp: , Rfl:  .  losartan (COZAAR) 50 MG tablet, Take 1 tablet (50 mg total) by mouth daily., Disp: 30 tablet, Rfl: 11 .  magnesium oxide (MAG-OX) 400 MG tablet, Take 1 tablet (400 mg total) by mouth daily., Disp: 30 tablet, Rfl: 6 .  metolazone (ZAROXOLYN) 5 MG tablet, Take 1 tablet every Monday, Wednesday, and Friday., Disp: 15 tablet, Rfl: 3 .  Misc. Devices MISC, Requires O2 @ 3L/min continuously via nasal canula and home fill system,., Disp: 1 each, Rfl: 0 .  Misc. Devices MISC, Please provide Benjamin [name] with insurance approved portable O2 concentrator ICD 10 J96.11 Z99.81, Disp: 1 each, Rfl: 0 .  omeprazole (PRILOSEC) 20 MG capsule, Take 1 capsule (20 mg total) by mouth daily., Disp: 30 capsule, Rfl: 3 .  potassium chloride SA (KLOR-CON) 20 MEQ tablet, Take 60 meq (3 tabs) in the morning, 40 meq (2 tabs) in the afternoon and 40 meq (2 tabs) in the evening.  Take an extra 2 tabs every dose of metolazone, Disp: 360 tablet, Rfl: 3 .  rivaroxaban (XARELTO) 20 MG TABS tablet, Take 1 tablet (20 mg total) by mouth daily with supper., Disp: 60 tablet, Rfl: 6 .  spironolactone (ALDACTONE) 25 MG tablet, Take 1 tablet (25 mg total) by mouth daily., Disp: 30 tablet, Rfl: 3 .  torsemide (DEMADEX) 100 MG tablet,  Take 1 tablet (100 mg total) by mouth 2 (two) times daily., Disp: 180 tablet, Rfl: 3 .  TRUEplus Lancets 28G MISC, Use as instructed. Check blood glucose level by fingerstick twice per day., Disp: 200 each, Rfl: 3 .  amLODipine (NORVASC) 10 MG tablet, Take 1 tablet (10 mg total) by mouth daily., Disp: 90 tablet, Rfl: 1 .  atorvastatin (LIPITOR) 20 MG tablet, Take 1 tablet (20 mg total) by mouth at bedtime., Disp: 90 tablet, Rfl: 2 .  ferrous sulfate 325 (65 FE) MG tablet, Take 1 tablet (325 mg total) by mouth daily. Patient will pick up scripts today., Disp: 90 tablet, Rfl: 0 .  metFORMIN (GLUCOPHAGE) 500 MG tablet, Take 1 tablet (500 mg total) by mouth 2 (two) times daily with a meal. Patient will pick up scripts today., Disp: 180 tablet, Rfl: 1 No Known Allergies    Social History   Socioeconomic History  . Marital status: Single    Spouse name: Not on file  . Number of children: Not on file  . Years of education: Not on file  . Highest education level: Not on file  Occupational History  . Not on file  Tobacco Use  . Smoking status: Former Research scientist (life sciences)  . Smokeless tobacco: Never Used  Substance and Sexual Activity  . Alcohol use: Not Currently  . Drug use: Not Currently  . Sexual activity: Not Currently  Other Topics Concern  . Not on file  Social History Narrative  . Not on file   Social Determinants of Health   Financial Resource Strain:   . Difficulty of Paying Living Expenses: Not on file  Food Insecurity:   . Worried About Charity fundraiser in the Last Year: Not on file  . Ran Out of Food in the Last Year: Not on file  Transportation Needs:   . Lack of Transportation (Medical): Not on file  . Lack of Transportation (Non-Medical): Not on file  Physical Activity:   . Days of Exercise per Week: Not on file  . Minutes of Exercise per Session: Not on file  Stress:   . Feeling of Stress : Not on file  Social Connections:   . Frequency of Communication with Friends and  Family: Not on file  . Frequency of Social Gatherings with Friends and Family: Not on file  . Attends Religious Services: Not on file  . Active Member of Clubs or Organizations: Not on file  . Attends Archivist Meetings: Not on file  . Marital Status: Not on file  Intimate Partner Violence:   . Fear of Current or Ex-Partner: Not on file  . Emotionally Abused: Not on file  . Physically Abused: Not on file  . Sexually Abused: Not on file    Physical Exam      Future Appointments  Date Time Provider [place]  10/10/2019  9:30 AM [facility] LAB [facility] None  10/28/2019  9:30 AM [facility] PA/NP [facility] None    BP (!) 140/0   Pulse 95   Temp (!) 97.1 F (36.2 C)   Resp 20   Wt (!) 391 lb (177.4 kg)   SpO2 96%   BMI 54.53 kg/m   Weight yesterday-386 Last visit weight-398  Pt reports that he is doing good. That troubled roommate is gone and things have been better. Using cpap nightly.  He denies sob, no dizziness. Sleeping good, has a lot of energy during the day.  Just took afternoon meds when I arrived. He missed a few doses of meds this week.  Needs metolazone and torsemide refilled. Called that in for him.  meds verified and pill box refilled.   [name], [place] [facility] Paramedic  10/07/19

## 2019-10-08 ENCOUNTER — Encounter (HOSPITAL_COMMUNITY): Payer: Medicaid Other

## 2019-10-09 ENCOUNTER — Telehealth (HOSPITAL_COMMUNITY): Payer: Self-pay | Admitting: Licensed Clinical Social Worker

## 2019-10-09 NOTE — Telephone Encounter (Signed)
Pt informed CSW that he never heard back from [facility] if he was eligible for their services.  CSW Audiological scientist who confirms pt is eligible and can make reservations- CSW provided her with pt new home address and she will mail out the certification packet to him  CSW informed pt and made sure he has the reservation line number for their services  CSW will continue to follow and assist as needed  [name], LCSW Clinical Social Worker [facility] Desk#: [phone] Cell#: [phone]

## 2019-10-10 ENCOUNTER — Ambulatory Visit (HOSPITAL_COMMUNITY)
Admission: RE | Admit: 2019-10-10 | Discharge: 2019-10-10 | Disposition: A | Discharge status: Home / Self Care | Payer: Medicaid Other | Source: Ambulatory Visit | Attending: Cardiology | Admitting: Cardiology

## 2019-10-10 ENCOUNTER — Telehealth (HOSPITAL_COMMUNITY): Payer: Self-pay

## 2019-10-10 ENCOUNTER — Other Ambulatory Visit: Payer: Self-pay

## 2019-10-10 ENCOUNTER — Other Ambulatory Visit (HOSPITAL_COMMUNITY): Payer: Medicaid Other

## 2019-10-10 DIAGNOSIS — I5032 Chronic diastolic (congestive) heart failure: Secondary | ICD-10-CM | POA: Insufficient documentation

## 2019-10-10 DIAGNOSIS — E876 Hypokalemia: Secondary | ICD-10-CM

## 2019-10-10 DIAGNOSIS — R790 Abnormal level of blood mineral: Secondary | ICD-10-CM

## 2019-10-10 LAB — BASIC METABOLIC PANEL
Anion gap: 12 (ref 5–15)
BUN: 17 mg/dL (ref 6–20)
CO2: 31 mmol/L (ref 22–32)
Calcium: 9.1 mg/dL (ref 8.9–10.3)
Chloride: 93 mmol/L — ABNORMAL LOW (ref 98–111)
Creatinine, Ser: 1.39 mg/dL — ABNORMAL HIGH (ref 0.61–1.24)
GFR calc Af Amer: >60 mL/min (ref >60)
GFR calc non Af Amer: 57 mL/min — ABNORMAL LOW (ref >60)
Glucose, Bld: 105 mg/dL — ABNORMAL HIGH (ref 70–99)
Potassium: 3.9 mmol/L (ref 3.5–5.1)
Sodium: 136 mmol/L (ref 135–145)

## 2019-10-10 LAB — MAGNESIUM: Magnesium: 1.3 mg/dL — ABNORMAL LOW (ref 1.7–2.4)

## 2019-10-10 NOTE — Telephone Encounter (Signed)
-----   Message from Laurey Morale, MD sent at 10/10/2019  3:49 PM EST ----- Add magnesium oxide 400 mg daily, check Mg level 1 week.

## 2019-10-11 ENCOUNTER — Other Ambulatory Visit (HOSPITAL_COMMUNITY): Payer: Medicaid Other

## 2019-10-11 ENCOUNTER — Telehealth (HOSPITAL_COMMUNITY): Payer: Self-pay

## 2019-10-11 ENCOUNTER — Telehealth (HOSPITAL_COMMUNITY): Payer: Self-pay | Admitting: *Deleted

## 2019-10-11 IMAGING — CR CHEST - 2 VIEW
2 series · 2 of 2 positions shown · non-contrast
Comparison: CT chest and chest x-ray dated February 26, 2019.

CLINICAL DATA: Shortness of breath.

EXAM:
CHEST - 2 VIEW

[chest pa]
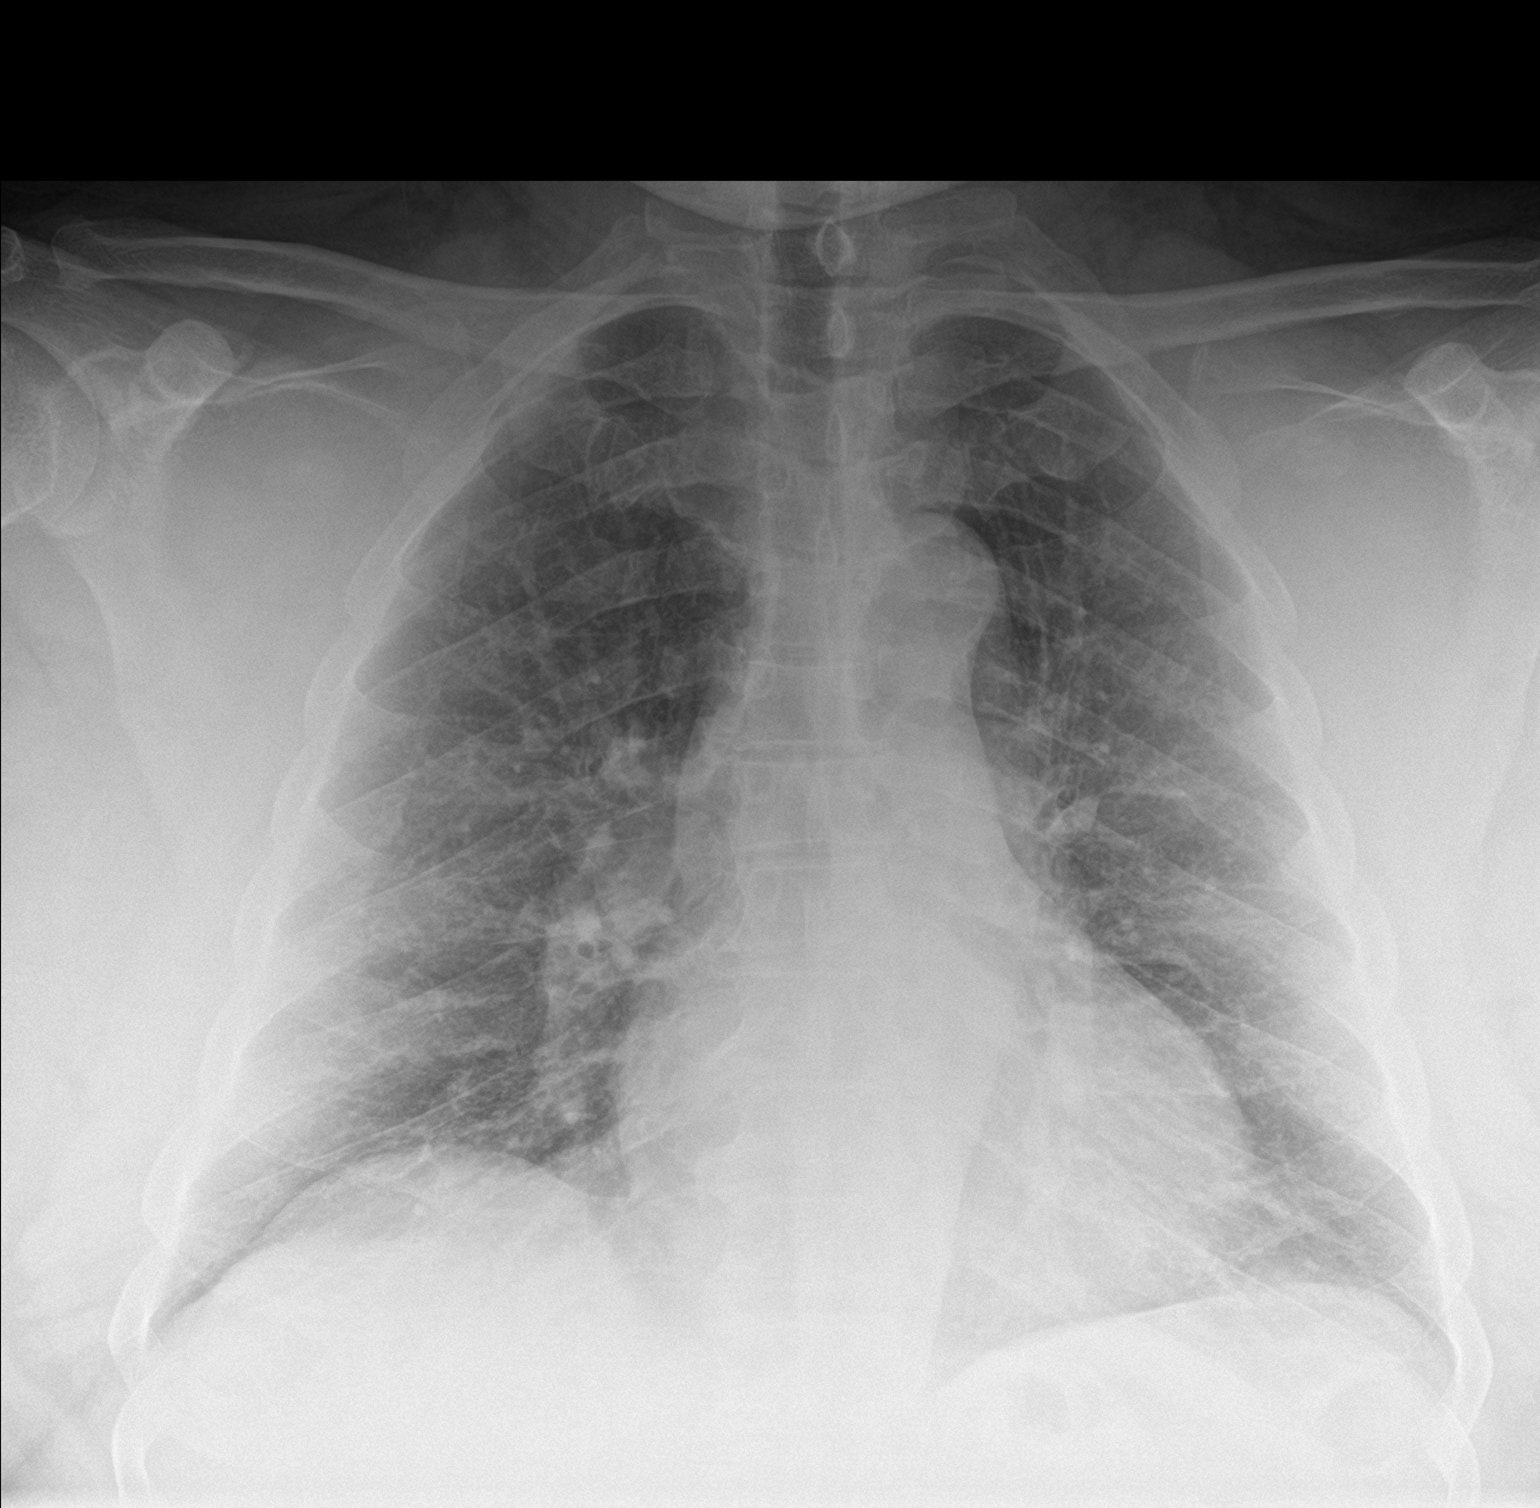

[chest lat]
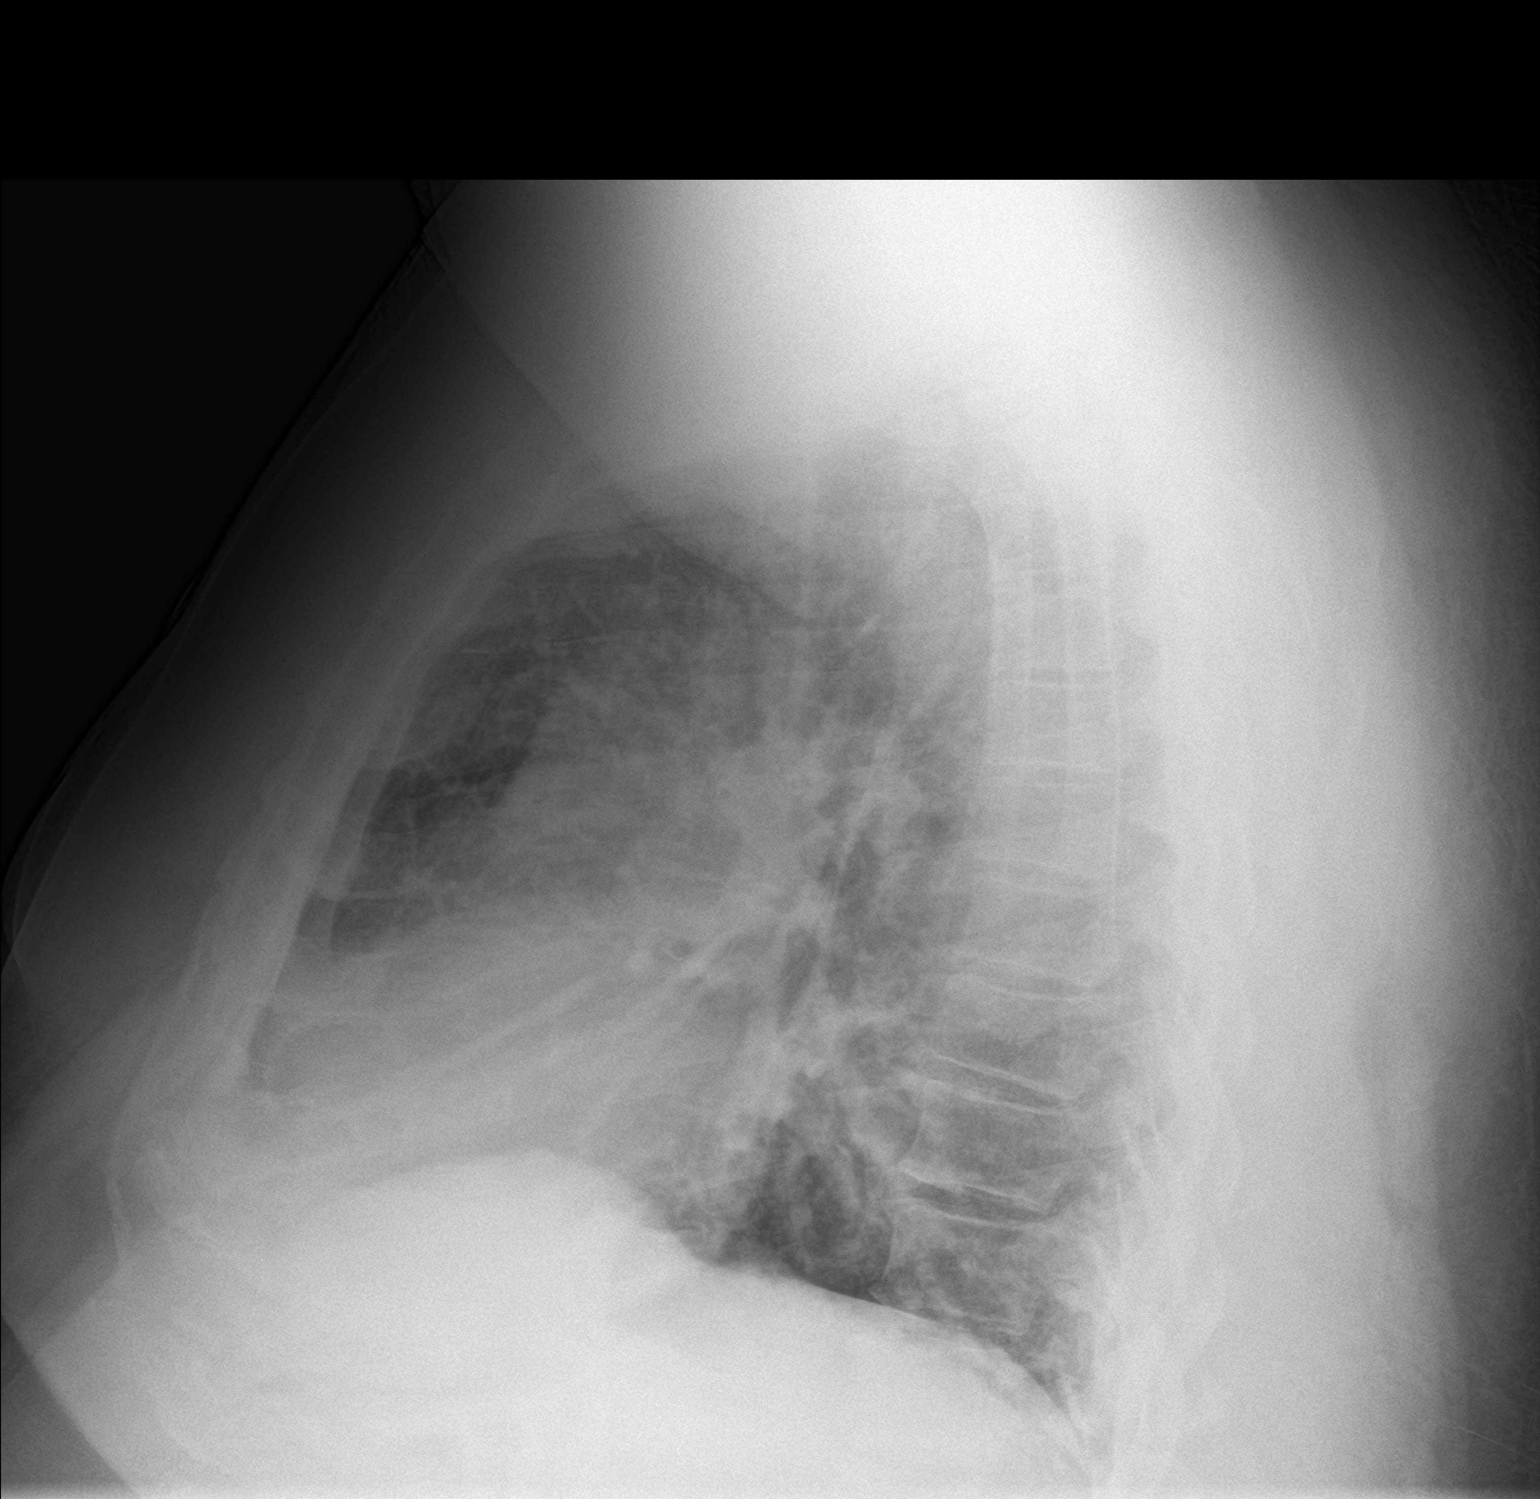

[2 of 2 positions shown; findings below may reference images not displayed]

FINDINGS: Unchanged borderline cardiomegaly. Normal mediastinal contours.
Normal pulmonary vascularity. Unchanged minimal bibasilar
atelectasis. No focal consolidation, pleural effusion, or
pneumothorax. No acute osseous abnormality.
IMPRESSION: No active cardiopulmonary disease.

## 2019-10-11 MED ORDER — MAGNESIUM OXIDE 400 MG PO TABS: 400.0000 mg | ORAL_TABLET | Freq: Two times a day (BID) | ORAL | 6 refills | Status: DC
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
## 2019-10-11 NOTE — Telephone Encounter (Signed)
-----   Message from Laurey Morale, MD sent at 10/10/2019  4:40 PM EST ----- Increase to 400 mg bid

## 2019-10-12 DIAGNOSIS — I5032 Chronic diastolic (congestive) heart failure: Secondary | ICD-10-CM | POA: Diagnosis not present

## 2019-10-12 DIAGNOSIS — J9611 Chronic respiratory failure with hypoxia: Secondary | ICD-10-CM | POA: Diagnosis not present

## 2019-10-14 ENCOUNTER — Other Ambulatory Visit (HOSPITAL_COMMUNITY): Payer: Self-pay

## 2019-10-14 NOTE — Addendum Note (Signed)
Encounter addended by: Crissie Figures, RN on: 10/14/2019 11:59 AM  Actions taken: Imaging Exam ended
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
## 2019-10-15 DIAGNOSIS — I10 Essential (primary) hypertension: Secondary | ICD-10-CM | POA: Diagnosis not present

## 2019-10-17 ENCOUNTER — Other Ambulatory Visit (HOSPITAL_COMMUNITY): Payer: Medicaid Other

## 2019-10-17 ENCOUNTER — Telehealth (HOSPITAL_COMMUNITY): Payer: Self-pay | Admitting: Licensed Clinical Social Worker

## 2019-10-17 NOTE — Telephone Encounter (Signed)
CSW called pt to check in after no show to lab appt this morning.  Pt reports it just slipped his mind- request for it to be rescheduled for Monday.  CSW confirmed this was ok with APP and had scheduler set him up for Monday- will need a taxi as its too late to request Medicaid transport for Monday- added to appt notes.  CSW will continue to follow and assist as needed  [name], LCSW Clinical Social Worker [facility] Desk#: [phone] Cell#: [phone]

## 2019-10-18 ENCOUNTER — Telehealth (HOSPITAL_COMMUNITY): Payer: Self-pay | Admitting: *Deleted

## 2019-10-18 ENCOUNTER — Other Ambulatory Visit (HOSPITAL_COMMUNITY): Payer: Self-pay

## 2019-10-18 DIAGNOSIS — I5032 Chronic diastolic (congestive) heart failure: Secondary | ICD-10-CM

## 2019-10-18 NOTE — Telephone Encounter (Signed)
Contacted patient for Exercise and wellness following as he was expected to meet with me today (1/28) after his lab work at [facility] for his coaching session. No voicemail available.    Lesia Hausen, MS, ACSM-RCEP Clinical Exercise Physiologist

## 2019-10-18 NOTE — Telephone Encounter (Signed)
Contacted patient to follow up from missed visit with me for Exercise and Wellness coaching on Thursday 1/28. No answer.   Also needed to discuss process for his candidacy of pulmonary rehab vs. PREP referral. There are challenges with his insurance in regards to pulmonary rehab and patient has been discussed with Dr. Shirlee Latch and Clinical Exercise Physiologist for need for PREP referral. Referral has been sent for patient to begin PREP program when a space is available. R[facility]P/EWC will continue to try calling and following patient with these updates and coaching.    Lesia Hausen, MS, ACSM-RCEP Clinical Exercise Physiologist

## 2019-10-21 ENCOUNTER — Other Ambulatory Visit: Payer: Self-pay | Admitting: Nurse Practitioner

## 2019-10-21 ENCOUNTER — Other Ambulatory Visit (HOSPITAL_COMMUNITY): Payer: Self-pay

## 2019-10-21 ENCOUNTER — Other Ambulatory Visit: Payer: Self-pay

## 2019-10-21 ENCOUNTER — Telehealth: Payer: Self-pay

## 2019-10-21 ENCOUNTER — Ambulatory Visit (HOSPITAL_COMMUNITY)
Admission: RE | Admit: 2019-10-21 | Discharge: 2019-10-21 | Disposition: A | Discharge status: Home / Self Care | Payer: Medicaid Other | Source: Ambulatory Visit | Attending: Internal Medicine | Admitting: Internal Medicine

## 2019-10-21 DIAGNOSIS — R790 Abnormal level of blood mineral: Secondary | ICD-10-CM

## 2019-10-21 DIAGNOSIS — J9611 Chronic respiratory failure with hypoxia: Secondary | ICD-10-CM | POA: Diagnosis not present

## 2019-10-21 DIAGNOSIS — I5032 Chronic diastolic (congestive) heart failure: Secondary | ICD-10-CM | POA: Diagnosis not present

## 2019-10-21 DIAGNOSIS — E612 Magnesium deficiency: Secondary | ICD-10-CM | POA: Diagnosis not present

## 2019-10-21 LAB — MAGNESIUM: Magnesium: 1.4 mg/dL — ABNORMAL LOW (ref 1.7–2.4)

## 2019-10-21 MED ORDER — MAGNESIUM OXIDE 400 MG PO TABS: 400.0000 mg | ORAL_TABLET | Freq: Two times a day (BID) | ORAL | 6 refills | Status: DC

## 2019-10-21 NOTE — Telephone Encounter (Signed)
Called patient reference referral to PREP program. Patient is interested in exercise program. Will need assistance with transport. Will call patient back on Friday with class start date and arrange intake.  Wears O2.
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
## 2019-10-21 NOTE — Addendum Note (Signed)
Encounter addended by: [name], LCSW on: 10/21/2019 11:21 AM  Actions taken: Clinical Note Signed

## 2019-10-22 ENCOUNTER — Telehealth (HOSPITAL_COMMUNITY): Payer: Self-pay

## 2019-10-22 ENCOUNTER — Telehealth (HOSPITAL_COMMUNITY): Payer: Self-pay | Admitting: Licensed Clinical Social Worker

## 2019-10-22 MED ORDER — MAGNESIUM OXIDE 400 MG PO TABS: ORAL_TABLET | ORAL | 6 refills | Status: DC

## 2019-10-22 NOTE — Telephone Encounter (Signed)
-----   Message from Laurey Morale, MD sent at 10/21/2019 10:49 PM EST ----- Increase Mg to 800 qam, 400 qpm and get Mg level in 1 week.

## 2019-10-22 NOTE — Telephone Encounter (Signed)
Spoke with patient, aware of results and med change. Spoke with [name] (paramedicine) and made her aware of med changes. Pt has f/u appt in 1 week will repeat mag level at visit

## 2019-10-22 NOTE — Telephone Encounter (Signed)
CSW continuing to assist patient with finding alternative housing.  Spoke with Investment banker, corporate with [employer] that manages 5 income based properties in [place].  CSW completed as much of the application as possible and has asked Community Paramedic to take out to patient to sign this week.  CSW will continue to follow and assist as needed  [name], LCSW Clinical Social Worker [facility] Desk#: [phone] Cell#: [phone]

## 2019-10-23 ENCOUNTER — Telehealth (HOSPITAL_COMMUNITY): Payer: Self-pay | Admitting: Licensed Clinical Social Worker

## 2019-10-23 ENCOUNTER — Other Ambulatory Visit (HOSPITAL_COMMUNITY): Payer: Self-pay | Admitting: *Deleted

## 2019-10-23 ENCOUNTER — Other Ambulatory Visit (HOSPITAL_COMMUNITY): Payer: Self-pay

## 2019-10-23 MED ORDER — MAGNESIUM OXIDE 400 MG PO TABS: ORAL_TABLET | ORAL | 6 refills | Status: DC
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
## 2019-10-24 ENCOUNTER — Telehealth (HOSPITAL_COMMUNITY): Payer: Self-pay | Admitting: Licensed Clinical Social Worker

## 2019-10-24 ENCOUNTER — Other Ambulatory Visit (HOSPITAL_COMMUNITY): Payer: Self-pay

## 2019-10-24 NOTE — Telephone Encounter (Addendum)
CSW approved to get patient another 4 weeks or heart healthy meals through [facility] program- will arrive early next week.  CSW then informed that he had received a bill from the [employer]io patch company for $395- wondering if it wasn't covered by insurance.  CSW spoke with [employer] path rep and they report that pt should not have received a bill as they don't bill Medicaid recipients- she will look into it and get back with CSW.  Scientist, research (medical) helped pt in Art gallery manager for [employer] apt sites in [place]- CSW sent out to all 5 apartment sites for review.  CSW will continue to follow and assist as needed  [name], LCSW Clinical Social Worker [facility] Desk#: [phone] Cell#: [phone]

## 2019-10-24 NOTE — Progress Notes (Signed)
Came out today after p/u magnesium from pharmacy, delivered that and placed in pill box where it was needed.  Also was able to find him a small fridge to keep it in his room.  Will f/u next week.   Kerry Hough, EMT-Paramedic 10/24/19

## 2019-10-25 ENCOUNTER — Telehealth: Payer: Self-pay

## 2019-10-25 NOTE — Telephone Encounter (Signed)
Call placed to patient with start of PREP 11/26/19 Tues/Thurs 230pm start time.  Patient agreeable. Needs transport assist to get to [facility] since not on bus route. Will arrange when we get closer to the appt time. Intake scheduled 3/4 930a.

## 2019-10-28 ENCOUNTER — Other Ambulatory Visit (HOSPITAL_COMMUNITY): Payer: Self-pay

## 2019-10-28 ENCOUNTER — Ambulatory Visit (HOSPITAL_COMMUNITY)
Admission: RE | Admit: 2019-10-28 | Discharge: 2019-10-28 | Disposition: A | Discharge status: Home / Self Care | Payer: Medicaid Other | Source: Ambulatory Visit | Attending: Adult Health | Admitting: Adult Health

## 2019-10-28 ENCOUNTER — Other Ambulatory Visit: Payer: Self-pay

## 2019-10-28 ENCOUNTER — Encounter (HOSPITAL_COMMUNITY): Payer: Self-pay | Admitting: *Deleted

## 2019-10-28 ENCOUNTER — Encounter (HOSPITAL_COMMUNITY): Payer: Self-pay

## 2019-10-28 VITALS — BP 165/90 | HR 88 | Wt >= 6400 oz

## 2019-10-28 DIAGNOSIS — Z6841 Body Mass Index (BMI) 40.0 and over, adult: Secondary | ICD-10-CM | POA: Diagnosis not present

## 2019-10-28 DIAGNOSIS — I110 Hypertensive heart disease with heart failure: Secondary | ICD-10-CM | POA: Diagnosis not present

## 2019-10-28 DIAGNOSIS — Z79899 Other long term (current) drug therapy: Secondary | ICD-10-CM | POA: Diagnosis not present

## 2019-10-28 DIAGNOSIS — Z8616 Personal history of COVID-19: Secondary | ICD-10-CM | POA: Diagnosis not present

## 2019-10-28 DIAGNOSIS — G4733 Obstructive sleep apnea (adult) (pediatric): Secondary | ICD-10-CM

## 2019-10-28 DIAGNOSIS — I480 Paroxysmal atrial fibrillation: Secondary | ICD-10-CM | POA: Insufficient documentation

## 2019-10-28 DIAGNOSIS — Z86711 Personal history of pulmonary embolism: Secondary | ICD-10-CM | POA: Diagnosis not present

## 2019-10-28 DIAGNOSIS — I5032 Chronic diastolic (congestive) heart failure: Secondary | ICD-10-CM | POA: Insufficient documentation

## 2019-10-28 DIAGNOSIS — K219 Gastro-esophageal reflux disease without esophagitis: Secondary | ICD-10-CM | POA: Diagnosis not present

## 2019-10-28 DIAGNOSIS — Z87891 Personal history of nicotine dependence: Secondary | ICD-10-CM | POA: Diagnosis not present

## 2019-10-28 DIAGNOSIS — Z833 Family history of diabetes mellitus: Secondary | ICD-10-CM | POA: Diagnosis not present

## 2019-10-28 DIAGNOSIS — Z7901 Long term (current) use of anticoagulants: Secondary | ICD-10-CM | POA: Insufficient documentation

## 2019-10-28 DIAGNOSIS — Z8249 Family history of ischemic heart disease and other diseases of the circulatory system: Secondary | ICD-10-CM | POA: Insufficient documentation

## 2019-10-28 DIAGNOSIS — Z7984 Long term (current) use of oral hypoglycemic drugs: Secondary | ICD-10-CM | POA: Insufficient documentation

## 2019-10-28 DIAGNOSIS — D649 Anemia, unspecified: Secondary | ICD-10-CM | POA: Insufficient documentation

## 2019-10-28 DIAGNOSIS — I10 Essential (primary) hypertension: Secondary | ICD-10-CM

## 2019-10-28 DIAGNOSIS — E119 Type 2 diabetes mellitus without complications: Secondary | ICD-10-CM | POA: Diagnosis not present

## 2019-10-28 MED ORDER — LOSARTAN POTASSIUM 100 MG PO TABS: 100.0000 mg | ORAL_TABLET | Freq: Every day | ORAL | 5 refills | Status: DC
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
## 2019-10-28 NOTE — Progress Notes (Signed)
Came out today for med rec post clinic visit.  He missed a Saturday AM dose this past week.  He isnt sure if he took his meds today or not. He thinks he did.  Will resume meds from his afternoon dose.  --losartan increased to 100mg  daily bedtime. He has it in through the fri bedtime dose and needs it placed for Saturday pm. Pharmacy will get it ready for me to pick up and I will bring it back out sometime this week.  meds verified and pill box refilled.   Friday, EMT-Paramedic  10/28/19
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
## 2019-10-28 NOTE — Patient Instructions (Signed)
Lab work will need to be done next week.  INCREASE Losaratan to 100mg  daily.  Please follow up with the [facility] in 4 weeks.  At the [facility], you and your health needs are our priority. As part of our continuing mission to provide you with exceptional heart care, we have created designated Provider Care Teams. These Care Teams include your primary Cardiologist (physician) and Advanced Practice Providers (APPs- Physician Assistants and Nurse Practitioners) who all work together to provide you with the care you need, when you need it.   You may see any of the following providers on your designated Care Team at your next follow up: Dr Marland Kitchen . Dr Arvilla Meres . Marca Ancona, NP . Tonye Becket, PA . Robbie Lis, PharmD   Please be sure to bring in all your medications bottles to every appointment.

## 2019-10-28 NOTE — Progress Notes (Signed)
Advanced Heart Failure Clinic Note   Referring Physician: PCP: [name], NP PCP-Cardiologist: Dorris Carnes, MD  [facility]: Dr. Aundra Dubin   HPI: Mr.Williamsis a 54 yo with hx of diastolic CHF, HTN, DM, Atrial fib, PE in April 2020 (on Xarelto), OSA, GERD,chronic elevation of troponin.  Admitted 01/21/2019 with increased dyspnea and had PE. He was anticoagulated.   Patient re-admitted 5/20 for syncope/cough/dyspnea, COVID+, CT positive for bilateral GGOs, admitted to [facility] and discharged after 6 days, on O2. Rec'd Cards f/u.  Patient re-admitted 02/20/19 for bright red blood per rectum, eval'ed by GI, had EGD/colon, dx'd with hemorrhoidal bleeding, discharged after 9 days on O2.He was again Covid + .   Readmitted 03/11/19 with increased shortness of breath. He did not have home oxygen. He was discharged to home the next day.  He presented to [facility] ED7/23/20with lower extremity edema. COVID negative. Diuresed with lasix drip and transitioned to torsemide 40 mg twice daily. Had [place] with mild volume overload and preserved cardiac output. Discharge weight was 407.9 pounds.   He was readmitted with symptomatic anemia 05/28/19 with hgb 7.1. Received 1UPRBCs. GI consulted. He started on anusol and continued on stool softner.  He had f/u visit 10/20 and was complaining of increased leg edema and weight gain. SOB with exertion. DeniedPND. + Orthopnea. Nobleeding issues.Was eating high sodium food -->Bo jangles chicken. No fever or chills. Taking all medications. Followed closely by HF Paramedicine.He was given a dose of IV Lasix in clinic, 80 mg w/ >1300 urine output after IV lasix.His home torsemide dose was increased to 100 bid and he was instructed to f/u in 2 weeks.   He was readmitted to the hospital on 07/16/19 when he presented to the ED w/ worsening symptoms and was admitted by IM for a/c diastolic CHF and treated w/ IV Lasix. Had 2 day hospital stay and was discharged on 10/29.  Echo was done 10/28 and showed normal LVEF 65-70%, RV interpreted as normal but suspect significant RV dysfunction.  After diuresis w/ IV Lasix, he was placed back on home diuretic regimen, torsemide 100 mg bid. Lisinopril 5 mg was also added to regimen. AHF team was not consulted that admit.  He was seen back in clinic 07/30/19. Felt poorly. SOB at rest and worse w/ exertion. Also w/ orthopnea/PND. Significant wt gain of 20 lb up to 428 lb (dry wt ~407 lb), w/ marked abdominal distention and LEE and poor response to IV diuretics.  Once diuresed transitioned to torsemide 100 mg twice a day. D/C 408 pounds.   Completed 3 week zio patch on 10/20/19. No arrhythmia.   Today he returns for HF follow up. Last visit losartan was increased to 50 mg daily. Overall feeling ok. Continues to use 2 liters [place] oxygen. Remains SOB with exertion. Denies /PNDOrthopnea.Using CPAP every night.  No bleeding issues. Appetite ok. No fever or chills. Weight at home  396 pounds. Has all medications but did not take his medications today. He has missed a few days of medications. Followed by HF Paramedicine.   Labs (11/20): BNP 32, K 3.6, creatinine 1.09 Labs (12/20): K 3.3, creatinine 1.28 Labs (10/10/19): K 3.9 Creatinine 1.4   Review of Systems: All systems reviewed and negative except as per HPI.   PMH: 1. Atrial fibrillation: Paroxysmal.  2. Pulmonary embolus: 5/20.  3. OHS/OSA: He is on home oxygen during the day and uses CPAP at night.  4. Morbid obesity.  5. Chronic diastolic CHF:  - RHC (9/23): mean  RA 12, PA 40/25 mean 31, mean PCWP 23, CI 2.47, PVR 1.1 WU.  - Echo (10/20): EF 65-70%, mild LVH, normal RV size and systolic function.  6. Type 2 diabetes 7. HTN 8. Rectal bleeding: ?Hemorrhoidal.  9. COVID-19 infection 7/20.  1-. ZIo Patch - 10/20/19 no arrhythmias  Current Outpatient Medications  Medication Sig Dispense Refill  . albuterol (VENTOLIN HFA) 108 (90 Base) MCG/ACT inhaler Inhale 2 puffs into the  lungs every 6 (six) hours as needed for wheezing or shortness of breath. 8.5 g 2  . amLODipine (NORVASC) 10 MG tablet Take 1 tablet (10 mg total) by mouth daily. 90 tablet 1  . atorvastatin (LIPITOR) 20 MG tablet Take 1 tablet (20 mg total) by mouth at bedtime. 90 tablet 2  . Blood Glucose Monitoring Suppl (TRUE METRIX METER) w/Device KIT Use as instructed. Check blood glucose level by fingerstick twice per day. 1 kit 0  . Blood Pressure Monitor DEVI Please provide patient with insurance approved blood pressure device with L-XL cuff. BMI 55 1 each 0  . carboxymethylcellulose (REFRESH PLUS) 0.5 % SOLN Place 1 drop into both eyes daily.     Marland Kitchen docusate sodium (COLACE) 100 MG capsule Take 100 mg by mouth 2 (two) times daily.    . ferrous sulfate 325 (65 FE) MG tablet Take 1 tablet (325 mg total) by mouth daily. Patient will pick up scripts today. 90 tablet 0  . glucose blood (TRUE METRIX BLOOD GLUCOSE TEST) test strip Use as instructed. Check blood glucose level by fingerstick twice per day. 100 each 12  . labetalol (NORMODYNE) 200 MG tablet Take 2 tablets (400 mg total) by mouth 2 (two) times daily. 120 tablet 3  . levalbuterol (XOPENEX HFA) 45 MCG/ACT inhaler Inhale 2 puffs into the lungs every 4 (four) hours as needed for wheezing.     Marland Kitchen losartan (COZAAR) 50 MG tablet Take 1 tablet (50 mg total) by mouth daily. 30 tablet 11  . magnesium oxide (MAG-OX) 400 MG tablet Take 2 tablets (800 mg total) by mouth every morning AND 1 tablet (400 mg total) every evening. 90 tablet 6  . metFORMIN (GLUCOPHAGE) 500 MG tablet Take 1 tablet (500 mg total) by mouth 2 (two) times daily with a meal. Patient will pick up scripts today. 180 tablet 1  . metolazone (ZAROXOLYN) 5 MG tablet Take 1 tablet every Monday, Wednesday, and Friday. 15 tablet 3  . Misc. Devices MISC Requires O2 @ 3L/min continuously via nasal canula and home fill system,. 1 each 0  . Misc. Devices MISC Please provide Mr. Reckart with insurance  approved portable O2 concentrator ICD 10 J96.11 Z99.81 1 each 0  . omeprazole (PRILOSEC) 20 MG capsule TAKE ONE CAPSULE BY MOUTH ONCE DAILY 30 capsule 2  . potassium chloride SA (KLOR-CON) 20 MEQ tablet Take 60 meq (3 tabs) in the morning, 40 meq (2 tabs) in the afternoon and 40 meq (2 tabs) in the evening.  Take an extra 2 tabs every dose of metolazone 360 tablet 3  . rivaroxaban (XARELTO) 20 MG TABS tablet Take 1 tablet (20 mg total) by mouth daily with supper. 60 tablet 6  . spironolactone (ALDACTONE) 25 MG tablet Take 1 tablet (25 mg total) by mouth daily. 30 tablet 3  . torsemide (DEMADEX) 100 MG tablet Take 1 tablet (100 mg total) by mouth 2 (two) times daily. 180 tablet 3  . TRUEplus Lancets 28G MISC Use as instructed. Check blood glucose level by fingerstick twice per  day. 200 each 3   No current facility-administered medications for this encounter.    No Known Allergies    Social History   Socioeconomic History  . Marital status: Single    Spouse name: Not on file  . Number of children: Not on file  . Years of education: Not on file  . Highest education level: Not on file  Occupational History  . Not on file  Tobacco Use  . Smoking status: Former Research scientist (life sciences)  . Smokeless tobacco: Never Used  Substance and Sexual Activity  . Alcohol use: Not Currently  . Drug use: Not Currently  . Sexual activity: Not Currently  Other Topics Concern  . Not on file  Social History Narrative  . Not on file   Social Determinants of Health   Financial Resource Strain:   . Difficulty of Paying Living Expenses: Not on file  Food Insecurity:   . Worried About Charity fundraiser in the Last Year: Not on file  . Ran Out of Food in the Last Year: Not on file  Transportation Needs:   . Lack of Transportation (Medical): Not on file  . Lack of Transportation (Non-Medical): Not on file  Physical Activity:   . Days of Exercise per Week: Not on file  . Minutes of Exercise per Session: Not on file    Stress:   . Feeling of Stress : Not on file  Social Connections:   . Frequency of Communication with Friends and Family: Not on file  . Frequency of Social Gatherings with Friends and Family: Not on file  . Attends Religious Services: Not on file  . Active Member of Clubs or Organizations: Not on file  . Attends Archivist Meetings: Not on file  . Marital Status: Not on file  Intimate Partner Violence:   . Fear of Current or Ex-Partner: Not on file  . Emotionally Abused: Not on file  . Physically Abused: Not on file  . Sexually Abused: Not on file      Family History  Problem Relation Age of Onset  . Hypertension Mother   . Diabetes Mother     Vitals:   10/28/19 0927  BP: (!) 165/90  Pulse: 88  SpO2: 96%  Weight: (!) 182.6 kg (402 lb 9.6 oz)   Wt Readings from Last 3 Encounters:  10/28/19 (!) 182.6 kg (402 lb 9.6 oz)  10/14/19 (!) 183.3 kg (404 lb)  10/07/19 (!) 177.4 kg (391 lb)     PHYSICAL EXAM: General:   No resp difficulty HEENT: normal Neck: supple.Thick neck  Difficult to assess. Carotids 2+ bilat; no bruits. No lymphadenopathy or thryomegaly appreciated. Cor: PMI nondisplaced. Regular rate & rhythm. No rubs, gallops or murmurs. Lungs: clear Abdomen: obese, soft, nontender, nondistended. No hepatosplenomegaly. No bruits or masses. Good bowel sounds. Extremities: no cyanosis, clubbing, rash, edema Neuro: alert & orientedx3, cranial nerves grossly intact. moves all 4 extremities w/o difficulty. Affect pleasant  ASSESSMENT & PLAN:  1. Chronic diastolic CHF: Echo in 09/32 with EF 65-70%, RV interpreted as normal but suspect significant RV dysfunction.  - NYHA III which is his baseline.   - Continue torsemide 100 mg bid and metolazone 2.5 mg three times a week.  -Continue current dose of potassium  - Continue spironolactone 25 mg daily.  - Continue w/ Paramedicine  2. Atrial fibrillation: Paroxysmal.  - Regular on exam. No bleeding issues.   -  Continue Xarelto.  3. H/o PE: 5/20, continue Xarelto.  4.  OHS/OSA: Continue CPAP at night and oxygen during the day. - Will refer to pulmonary rehab.  - Continue CPAP.  5. HTN: Elevated. Increase losartan to 100 mg daily.  6. "Falling out" spells:  Resolved using CPAP.  Zio Patch  Negative for arrhythmias  7. Obesity Body mass index is 56.15 kg/m. Discussed portion control.   Follow up in 4 weeks. Discussed with HFSW/HF Paramedicine. Greater than 50% of the (total minutes 25) visit spent in counseling/coordination of care regarding medication compliance and low salt food choices.    Attie Nawabi NP-C  10/28/2019 9:36 AM

## 2019-10-28 NOTE — Addendum Note (Signed)
Encounter addended by: [name], LCSW on: 10/28/2019 2:39 PM  Actions taken: Clinical Note Signed

## 2019-10-28 NOTE — Progress Notes (Signed)
Saw patient during clinic visit to discuss PREP referral and ongoing coaching plan. He will begin PREP Program in march and has been contacted by Viann Fish, Nurse in the PREP program for startup requirements. I will follow patient through the PREP program and will continue coaching patient as he requests throughout. Patient's overall goal has been to establish a routine exercise program, lose weight, better manage his heart healthy diet and re-establish his independent life.      Lesia Hausen, MS, ACSM-RCEP Clinical Exercise Physiologist/ Health and Wellness Coach

## 2019-10-30 ENCOUNTER — Other Ambulatory Visit (HOSPITAL_COMMUNITY): Payer: Self-pay

## 2019-10-30 NOTE — Progress Notes (Signed)
P/u meds from pharmacy and placed the losartan in sat PM dose.   Kerry Hough, EMT-Paramedic 10/30/19

## 2019-11-01 IMAGING — DX PORTABLE CHEST - 1 VIEW
1 series · 1 of 1 positions shown · non-contrast
Comparison: 03/11/2019 and earlier.

CLINICAL DATA: Acute onset shortness of breath and BILATERAL LOWER
extremity edema. [facility]-TW positive on 03/11/2019. Former smoker.
Current history of hypertension, diabetes, and sleep apnea.

EXAM:
PORTABLE CHEST 1 VIEW

[chest ap]
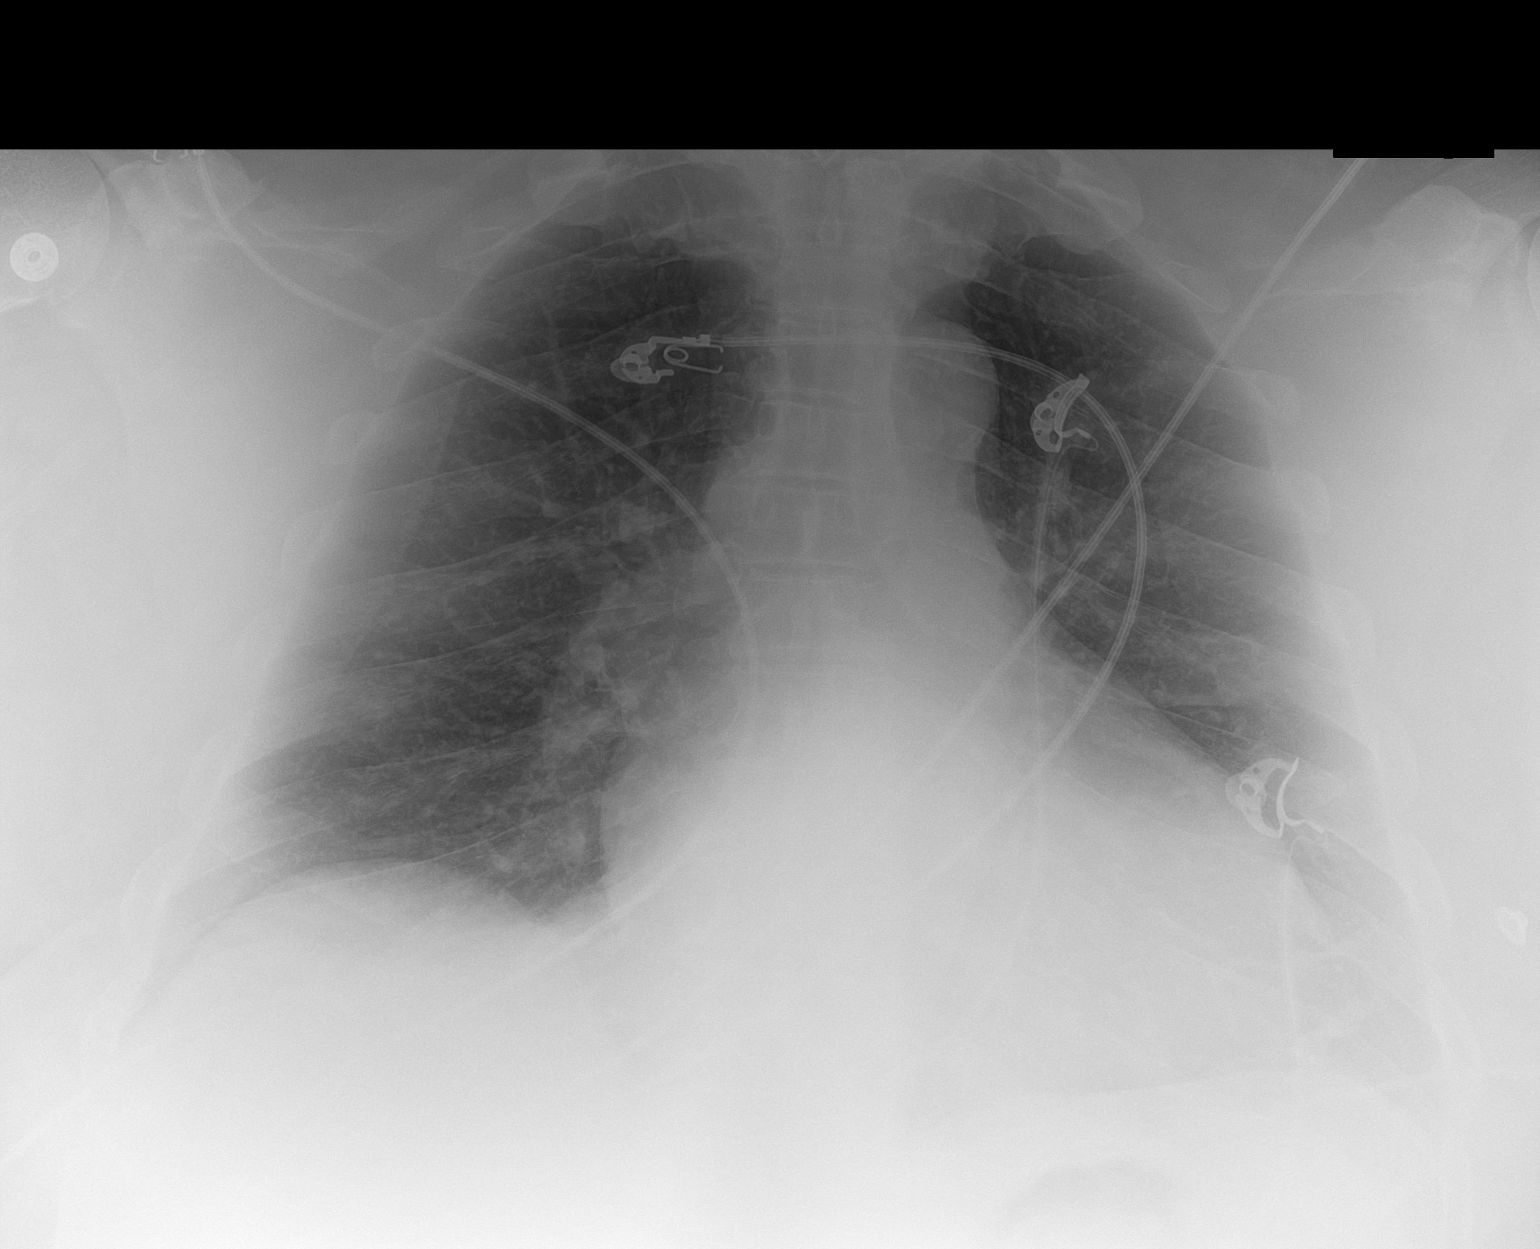

[1 of 1 positions shown; findings below may reference images not displayed]

FINDINGS: Cardiac silhouette upper normal in size to slightly enlarged,
unchanged. Lungs clear. Bronchovascular markings normal. Pulmonary
vascularity normal. No visible pleural effusions. No pneumothorax.
Stable mild elevation of the RIGHT hemidiaphragm.
IMPRESSION: No acute cardiopulmonary disease.

## 2019-11-04 ENCOUNTER — Telehealth (HOSPITAL_COMMUNITY): Payer: Self-pay | Admitting: Licensed Clinical Social Worker

## 2019-11-04 ENCOUNTER — Other Ambulatory Visit (HOSPITAL_COMMUNITY): Payer: Self-pay

## 2019-11-04 NOTE — Progress Notes (Signed)
Paramedicine Encounter    Patient ID: Benjamin [name], male    DOB: 04-25-1966, 54 y.o.   MRN: [id number]   Patient Care Team: Benjamin Pounds, NP as PCP - General (Nurse Practitioner) Benjamin Records, MD as PCP - Cardiology (Cardiology) Benjamin Ny, LCSW as Social Worker (Licensed Clinical Social Worker)  Patient Active Problem List   Diagnosis Date Noted  . Acute on chronic diastolic CHF (congestive heart failure), NYHA class 3 ([place]) 07/30/2019  . Diabetes mellitus without complication ([place]) 17/00/1749  . Hypertensive emergency   . BRBPR (bright red blood per rectum) 06/12/2019  . Hypertension   . CHF (congestive heart failure) ([place])   . Sleep apnea   . Chronic respiratory failure ([place])   . Hypokalemia 05/29/2019  . GI bleed 05/29/2019  . Anemia 05/28/2019  . Acute on chronic diastolic CHF (congestive heart failure) ([place]) 04/11/2019  . Iron deficiency anemia 03/11/2019  . OSA on CPAP 03/11/2019  . HLD (hyperlipidemia) 03/11/2019  . Elevated troponin 03/11/2019  . GERD (gastroesophageal reflux disease) 03/11/2019  . Rectal bleeding 02/20/2019  . Dyspnea 02/06/2019  . COVID-19 virus infection 02/06/2019  . Bilateral lower extremity edema   . Morbid obesity with BMI of 50.0-59.9, adult ([place])   . Chronic diastolic CHF (congestive heart failure) ([place])   . PAF (paroxysmal atrial fibrillation) ([place])   . PE (pulmonary thromboembolism) ([place]) 01/21/2019  . Hypertensive urgency 01/21/2019  . Diabetes mellitus type 2 in obese ([place]) 01/21/2019    Current Outpatient Medications:  .  albuterol (VENTOLIN HFA) 108 (90 Base) MCG/ACT inhaler, Inhale 2 puffs into the lungs every 6 (six) hours as needed for wheezing or shortness of breath., Disp: 8.5 g, Rfl: 2 .  Blood Glucose Monitoring Suppl (TRUE METRIX METER) w/Device KIT, Use as instructed. Check blood glucose level by fingerstick twice per day., Disp: 1 kit, Rfl: 0 .  Blood Pressure Monitor DEVI, Please provide patient with  insurance approved blood pressure device with L-XL cuff. BMI 55, Disp: 1 each, Rfl: 0 .  carboxymethylcellulose (REFRESH PLUS) 0.5 % SOLN, Place 1 drop into both eyes daily. , Disp: , Rfl:  .  docusate sodium (COLACE) 100 MG capsule, Take 100 mg by mouth 2 (two) times daily., Disp: , Rfl:  .  glucose blood (TRUE METRIX BLOOD GLUCOSE TEST) test strip, Use as instructed. Check blood glucose level by fingerstick twice per day., Disp: 100 each, Rfl: 12 .  labetalol (NORMODYNE) 200 MG tablet, Take 2 tablets (400 mg total) by mouth 2 (two) times daily., Disp: 120 tablet, Rfl: 3 .  levalbuterol (XOPENEX HFA) 45 MCG/ACT inhaler, Inhale 2 puffs into the lungs every 4 (four) hours as needed for wheezing. , Disp: , Rfl:  .  losartan (COZAAR) 100 MG tablet, Take 1 tablet (100 mg total) by mouth daily., Disp: 30 tablet, Rfl: 5 .  magnesium oxide (MAG-OX) 400 MG tablet, Take 2 tablets (800 mg total) by mouth every morning AND 1 tablet (400 mg total) every evening., Disp: 90 tablet, Rfl: 6 .  metolazone (ZAROXOLYN) 5 MG tablet, Take 1 tablet every Monday, Wednesday, and Friday., Disp: 15 tablet, Rfl: 3 .  Misc. Devices MISC, Requires O2 @ 3L/min continuously via nasal canula and home fill system,., Disp: 1 each, Rfl: 0 .  Misc. Devices MISC, Please provide [name] with insurance approved portable O2 concentrator ICD 10 J96.11 Z99.81, Disp: 1 each, Rfl: 0 .  omeprazole (PRILOSEC) 20 MG capsule, TAKE ONE CAPSULE BY MOUTH ONCE  DAILY, Disp: 30 capsule, Rfl: 2 .  potassium chloride SA (KLOR-CON) 20 MEQ tablet, Take 60 meq (3 tabs) in the morning, 40 meq (2 tabs) in the afternoon and 40 meq (2 tabs) in the evening.  Take an extra 2 tabs every dose of metolazone, Disp: 360 tablet, Rfl: 3 .  rivaroxaban (XARELTO) 20 MG TABS tablet, Take 1 tablet (20 mg total) by mouth daily with supper., Disp: 60 tablet, Rfl: 6 .  spironolactone (ALDACTONE) 25 MG tablet, Take 1 tablet (25 mg total) by mouth daily., Disp: 30 tablet, Rfl:  3 .  torsemide (DEMADEX) 100 MG tablet, Take 1 tablet (100 mg total) by mouth 2 (two) times daily., Disp: 180 tablet, Rfl: 3 .  TRUEplus Lancets 28G MISC, Use as instructed. Check blood glucose level by fingerstick twice per day., Disp: 200 each, Rfl: 3 .  amLODipine (NORVASC) 10 MG tablet, Take 1 tablet (10 mg total) by mouth daily., Disp: 90 tablet, Rfl: 1 .  atorvastatin (LIPITOR) 20 MG tablet, Take 1 tablet (20 mg total) by mouth at bedtime., Disp: 90 tablet, Rfl: 2 .  ferrous sulfate 325 (65 FE) MG tablet, Take 1 tablet (325 mg total) by mouth daily. Patient will pick up scripts today., Disp: 90 tablet, Rfl: 0 .  metFORMIN (GLUCOPHAGE) 500 MG tablet, Take 1 tablet (500 mg total) by mouth 2 (two) times daily with a meal. Patient will pick up scripts today., Disp: 180 tablet, Rfl: 1 No Known Allergies    Social History   Socioeconomic History  . Marital status: Single    Spouse name: Not on file  . Number of children: Not on file  . Years of education: Not on file  . Highest education level: Not on file  Occupational History  . Not on file  Tobacco Use  . Smoking status: Former Research scientist (life sciences)  . Smokeless tobacco: Never Used  Substance and Sexual Activity  . Alcohol use: Not Currently  . Drug use: Not Currently  . Sexual activity: Not Currently  Other Topics Concern  . Not on file  Social History Narrative  . Not on file   Social Determinants of Health   Financial Resource Strain:   . Difficulty of Paying Living Expenses: Not on file  Food Insecurity:   . Worried About Charity fundraiser in the Last Year: Not on file  . Ran Out of Food in the Last Year: Not on file  Transportation Needs:   . Lack of Transportation (Medical): Not on file  . Lack of Transportation (Non-Medical): Not on file  Physical Activity:   . Days of Exercise per Week: Not on file  . Minutes of Exercise per Session: Not on file  Stress:   . Feeling of Stress : Not on file  Social Connections:   .  Frequency of Communication with Friends and Family: Not on file  . Frequency of Social Gatherings with Friends and Family: Not on file  . Attends Religious Services: Not on file  . Active Member of Clubs or Organizations: Not on file  . Attends Archivist Meetings: Not on file  . Marital Status: Not on file  Intimate Partner Violence:   . Fear of Current or Ex-Partner: Not on file  . Emotionally Abused: Not on file  . Physically Abused: Not on file  . Sexually Abused: Not on file    Physical Exam      Future Appointments  Date Time Provider [place]  11/26/2019 11:00 AM  [facility] PA/NP [facility] None    BP (!) 146/0   Pulse 80   Temp 97.8 F (36.6 C)   Resp 20   SpO2 95%   Weight yesterday-? Not weighing  Last visit weight-402  Pt sitting in chair when I arrived, he has a "dazed" and "confused" look on his face again. He has not been using CPAP in days as he thinks it is increasing the amount of the second hand marijuana smoke that is making him fail his drug test that is going on in the house and also the same for his 02 use. I along with jenna have told him this is not the case and it will not increase that level for his drug test. I have told him to use that CPAP and 02 consistently. Pt states he will comply. He has missed numerous days of his meds, he at some point had put the pills from his pill box into a cup and thought he was making hot chocolate and a roommate saw that and caught him before he downed the whole cup. He again was falling asleep during our visit.  He has not been weighing daily either.  His PO knows his situation and we all know he is not the one using drugs. I have asked his roommates to use that outside as pt does not need to be around it. No increased edema noted. B/p is better than last week, he has not been checking it at home though.  meds verified and pill box refilled.   [name], [facility]  Paramedic  11/05/19

## 2019-11-04 NOTE — Telephone Encounter (Signed)
CSW informed by [employer] that pt received notice in the mail from [facility] apartments that some of his housing application was completed incorrectly.  Paramedic assisted pt in fixing errors and returned application to CSW- CSW scanned and emailed back to apartment site for review.  Paramedic also informed CSW that pt is not using his CPAP because of concerns of it increasing his likelihood of testing positive for marijuana as patient has roommates who smoke.  Pt has become very confused as a result of this and per paramedic and fell yesterday and hit his head- this has happened everytime he has been noncompliant with his CPAP.  CSW spoke with pt and reiterated importance of using CPAP for his safety as he falls so frequently when he is not using it- pt expressed understanding and will plan to start using it again.  CSW will continue to follow and assist as needed  [name], LCSW Clinical Social Worker Advanced Heart Failure Clinic Desk#: [phone] Cell#: [phone]

## 2019-11-05 ENCOUNTER — Other Ambulatory Visit: Payer: Self-pay

## 2019-11-05 ENCOUNTER — Ambulatory Visit (HOSPITAL_COMMUNITY)
Admission: RE | Admit: 2019-11-05 | Discharge: 2019-11-05 | Disposition: A | Discharge status: Home / Self Care | Payer: Medicaid Other | Source: Ambulatory Visit | Attending: Cardiology | Admitting: Cardiology

## 2019-11-05 DIAGNOSIS — I5032 Chronic diastolic (congestive) heart failure: Secondary | ICD-10-CM | POA: Diagnosis not present

## 2019-11-05 LAB — BASIC METABOLIC PANEL
Anion gap: 10 (ref 5–15)
BUN: 26 mg/dL — ABNORMAL HIGH (ref 6–20)
CO2: 33 mmol/L — ABNORMAL HIGH (ref 22–32)
Calcium: 9.3 mg/dL (ref 8.9–10.3)
Chloride: 94 mmol/L — ABNORMAL LOW (ref 98–111)
Creatinine, Ser: 1.47 mg/dL — ABNORMAL HIGH (ref 0.61–1.24)
GFR calc Af Amer: >60 mL/min (ref >60)
GFR calc non Af Amer: 54 mL/min — ABNORMAL LOW (ref >60)
Glucose, Bld: 109 mg/dL — ABNORMAL HIGH (ref 70–99)
Potassium: 3.2 mmol/L — ABNORMAL LOW (ref 3.5–5.1)
Sodium: 137 mmol/L (ref 135–145)

## 2019-11-06 ENCOUNTER — Telehealth (HOSPITAL_COMMUNITY): Payer: Self-pay

## 2019-11-06 NOTE — Telephone Encounter (Signed)
Continue current regimen. Needs to take meds as directed.   [place] NP-C  3:48 PM

## 2019-11-06 NOTE — Telephone Encounter (Signed)
Katie EMT-P stated that pt had a incident and didn't not take his potassium supplements for a few days. She stated that she had fixed his pill box and he is now taking it correctly. Still increase?

## 2019-11-11 ENCOUNTER — Other Ambulatory Visit (HOSPITAL_COMMUNITY): Payer: Self-pay

## 2019-11-11 NOTE — Progress Notes (Signed)
Paramedicine Encounter    Patient ID: Benjamin [name], male    DOB: [birth year], 54 y.o.   MRN: [id number]   Patient Care Team: Benjamin Pounds, NP as PCP - General (Nurse Practitioner) Benjamin Records, MD as PCP - Cardiology (Cardiology) Benjamin Ny, LCSW as Social Worker (Licensed Clinical Social Worker)  Patient Active Problem List   Diagnosis Date Noted  . Acute on chronic diastolic CHF (congestive heart failure), NYHA class 3 ([place]) 07/30/2019  . Diabetes mellitus without complication ([place]) 80/88/1103  . Hypertensive emergency   . BRBPR (bright red blood per rectum) 06/12/2019  . Hypertension   . CHF (congestive heart failure) ([place])   . Sleep apnea   . Chronic respiratory failure ([place])   . Hypokalemia 05/29/2019  . GI bleed 05/29/2019  . Anemia 05/28/2019  . Acute on chronic diastolic CHF (congestive heart failure) ([place]) 04/11/2019  . Iron deficiency anemia 03/11/2019  . OSA on CPAP 03/11/2019  . HLD (hyperlipidemia) 03/11/2019  . Elevated troponin 03/11/2019  . GERD (gastroesophageal reflux disease) 03/11/2019  . Rectal bleeding 02/20/2019  . Dyspnea 02/06/2019  . COVID-19 virus infection 02/06/2019  . Bilateral lower extremity edema   . Morbid obesity with BMI of 50.0-59.9, adult ([place])   . Chronic diastolic CHF (congestive heart failure) ([place])   . PAF (paroxysmal atrial fibrillation) ([place])   . PE (pulmonary thromboembolism) ([place]) 01/21/2019  . Hypertensive urgency 01/21/2019  . Diabetes mellitus type 2 in obese ([place]) 01/21/2019    Current Outpatient Medications:  .  albuterol (VENTOLIN HFA) 108 (90 Base) MCG/ACT inhaler, Inhale 2 puffs into the lungs every 6 (six) hours as needed for wheezing or shortness of breath., Disp: 8.5 g, Rfl: 2 .  Blood Glucose Monitoring Suppl (TRUE METRIX METER) w/Device KIT, Use as instructed. Check blood glucose level by fingerstick twice per day., Disp: 1 kit, Rfl: 0 .  Blood Pressure Monitor DEVI, Please provide patient with  insurance approved blood pressure device with L-XL cuff. BMI 55, Disp: 1 each, Rfl: 0 .  carboxymethylcellulose (REFRESH PLUS) 0.5 % SOLN, Place 1 drop into both eyes daily. , Disp: , Rfl:  .  docusate sodium (COLACE) 100 MG capsule, Take 100 mg by mouth 2 (two) times daily., Disp: , Rfl:  .  glucose blood (TRUE METRIX BLOOD GLUCOSE TEST) test strip, Use as instructed. Check blood glucose level by fingerstick twice per day., Disp: 100 each, Rfl: 12 .  labetalol (NORMODYNE) 200 MG tablet, Take 2 tablets (400 mg total) by mouth 2 (two) times daily., Disp: 120 tablet, Rfl: 3 .  levalbuterol (XOPENEX HFA) 45 MCG/ACT inhaler, Inhale 2 puffs into the lungs every 4 (four) hours as needed for wheezing. , Disp: , Rfl:  .  losartan (COZAAR) 100 MG tablet, Take 1 tablet (100 mg total) by mouth daily. (Patient taking differently: Take 100 mg by mouth at bedtime. ), Disp: 30 tablet, Rfl: 5 .  magnesium oxide (MAG-OX) 400 MG tablet, Take 2 tablets (800 mg total) by mouth every morning AND 1 tablet (400 mg total) every evening., Disp: 90 tablet, Rfl: 6 .  metolazone (ZAROXOLYN) 5 MG tablet, Take 1 tablet every Monday, Wednesday, and Friday., Disp: 15 tablet, Rfl: 3 .  Misc. Devices MISC, Requires O2 @ 3L/min continuously via nasal canula and home fill system,., Disp: 1 each, Rfl: 0 .  Misc. Devices MISC, Please provide Benjamin [name] with insurance approved portable O2 concentrator ICD 10 J96.11 Z99.81, Disp: 1 each, Rfl: 0 .  omeprazole (PRILOSEC) 20 MG capsule, TAKE ONE CAPSULE BY MOUTH ONCE DAILY, Disp: 30 capsule, Rfl: 2 .  potassium chloride SA (KLOR-CON) 20 MEQ tablet, Take 60 meq (3 tabs) in the morning, 40 meq (2 tabs) in the afternoon and 40 meq (2 tabs) in the evening.  Take an extra 2 tabs every dose of metolazone, Disp: 360 tablet, Rfl: 3 .  rivaroxaban (XARELTO) 20 MG TABS tablet, Take 1 tablet (20 mg total) by mouth daily with supper., Disp: 60 tablet, Rfl: 6 .  spironolactone (ALDACTONE) 25 MG tablet,  Take 1 tablet (25 mg total) by mouth daily., Disp: 30 tablet, Rfl: 3 .  torsemide (DEMADEX) 100 MG tablet, Take 1 tablet (100 mg total) by mouth 2 (two) times daily., Disp: 180 tablet, Rfl: 3 .  TRUEplus Lancets 28G MISC, Use as instructed. Check blood glucose level by fingerstick twice per day., Disp: 200 each, Rfl: 3 .  amLODipine (NORVASC) 10 MG tablet, Take 1 tablet (10 mg total) by mouth daily., Disp: 90 tablet, Rfl: 1 .  atorvastatin (LIPITOR) 20 MG tablet, Take 1 tablet (20 mg total) by mouth at bedtime., Disp: 90 tablet, Rfl: 2 .  ferrous sulfate 325 (65 FE) MG tablet, Take 1 tablet (325 mg total) by mouth daily. Patient will pick up scripts today., Disp: 90 tablet, Rfl: 0 .  metFORMIN (GLUCOPHAGE) 500 MG tablet, Take 1 tablet (500 mg total) by mouth 2 (two) times daily with a meal. Patient will pick up scripts today., Disp: 180 tablet, Rfl: 1 No Known Allergies    Social History   Socioeconomic History  . Marital status: Single    Spouse name: Not on file  . Number of children: Not on file  . Years of education: Not on file  . Highest education level: Not on file  Occupational History  . Not on file  Tobacco Use  . Smoking status: Former Research scientist (life sciences)  . Smokeless tobacco: Never Used  Substance and Sexual Activity  . Alcohol use: Not Currently  . Drug use: Not Currently  . Sexual activity: Not Currently  Other Topics Concern  . Not on file  Social History Narrative  . Not on file   Social Determinants of Health   Financial Resource Strain:   . Difficulty of Paying Living Expenses: Not on file  Food Insecurity:   . Worried About Charity fundraiser in the Last Year: Not on file  . Ran Out of Food in the Last Year: Not on file  Transportation Needs:   . Lack of Transportation (Medical): Not on file  . Lack of Transportation (Non-Medical): Not on file  Physical Activity:   . Days of Exercise per Week: Not on file  . Minutes of Exercise per Session: Not on file  Stress:    . Feeling of Stress : Not on file  Social Connections:   . Frequency of Communication with Friends and Family: Not on file  . Frequency of Social Gatherings with Friends and Family: Not on file  . Attends Religious Services: Not on file  . Active Member of Clubs or Organizations: Not on file  . Attends Archivist Meetings: Not on file  . Marital Status: Not on file  Intimate Partner Violence:   . Fear of Current or Ex-Partner: Not on file  . Emotionally Abused: Not on file  . Physically Abused: Not on file  . Sexually Abused: Not on file    Physical Exam      Future  Appointments  Date Time Provider [place]  11/26/2019 11:00 AM [facility] PA/NP [facility] None    BP 122/74   Pulse 82   Temp 97.7 F (36.5 C)   Resp 18   Wt (!) 397 lb (180.1 kg)   SpO2 90%   BMI 55.37 kg/m   Weight yesterday-?? Last visit weight-not been weighing   Pt reports he has been feeling good, he states they have not been smoking marijuana in the house this past week. He has done better with taking his meds this week as well.  been using CPAP too. His brother had party at his house this weeknd for his birthday.  His weight is down. He hasnt weighed in several days so nothing too recent to compare it to but it is down. He denies increased sob, no increased edema noted. No dizziness, no falls.  Pt needs torsemide and metolazone refilled for next week.  meds verified and pill box refilled.  he has been taking his b/p at home and last reading it was 150/100. No other memory readings in there. I checked my bp cuff against his machine and his was way off. I double checked it again to be sure and to confirm his was off. His read 160/77 and I got 122/ both times.   [name], [place] [facility] Paramedic  11/11/19

## 2019-11-12 DIAGNOSIS — J9611 Chronic respiratory failure with hypoxia: Secondary | ICD-10-CM | POA: Diagnosis not present

## 2019-11-12 DIAGNOSIS — I5032 Chronic diastolic (congestive) heart failure: Secondary | ICD-10-CM | POA: Diagnosis not present

## 2019-11-12 IMAGING — US ULTRASOUND SCROTUM DOPPLER COMPLETE
1 series · 13 of 25 positions shown · non-contrast
Comparison: None

CLINICAL DATA: Edema

EXAM:
SCROTAL ULTRASOUND
DOPPLER ULTRASOUND OF THE TESTICLES
TECHNIQUE: Complete ultrasound examination of the testicles, epididymis, and
other scrotal structures was performed. Color and spectral Doppler
ultrasound were also utilized to evaluate blood flow to the
testicles.

[Series 1: ultrasound scrotum doppler complete · 58 acquisitions, 13 frames shown]
[im 1/58]
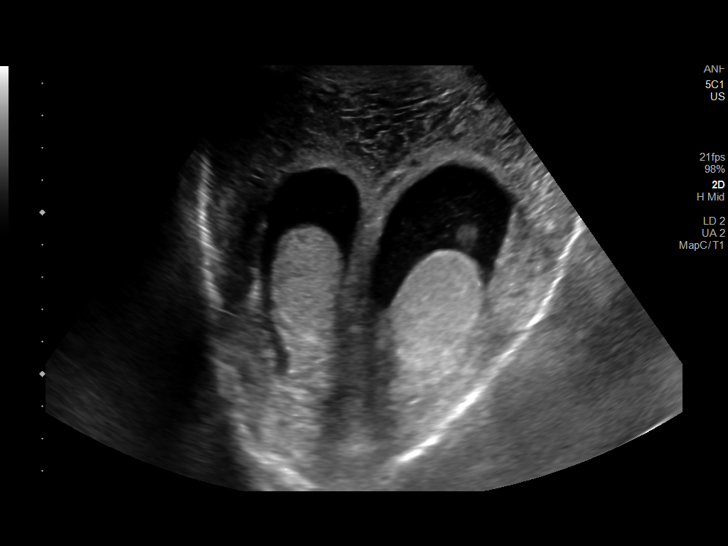
[im 5/58]
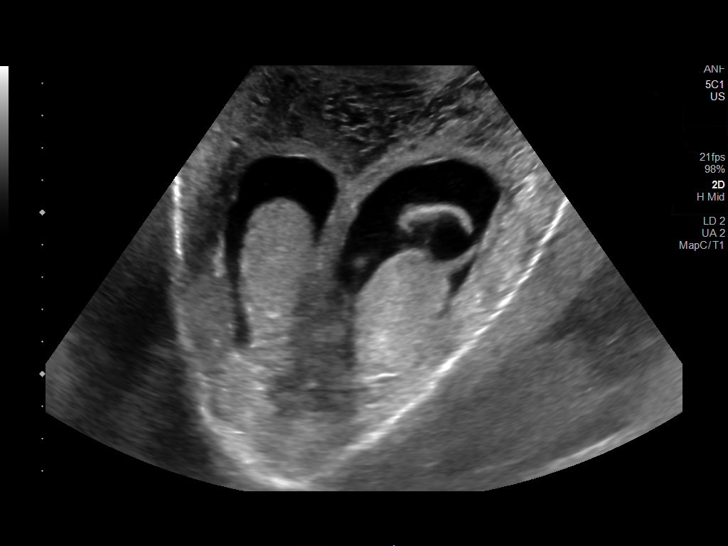
[im 10/58]
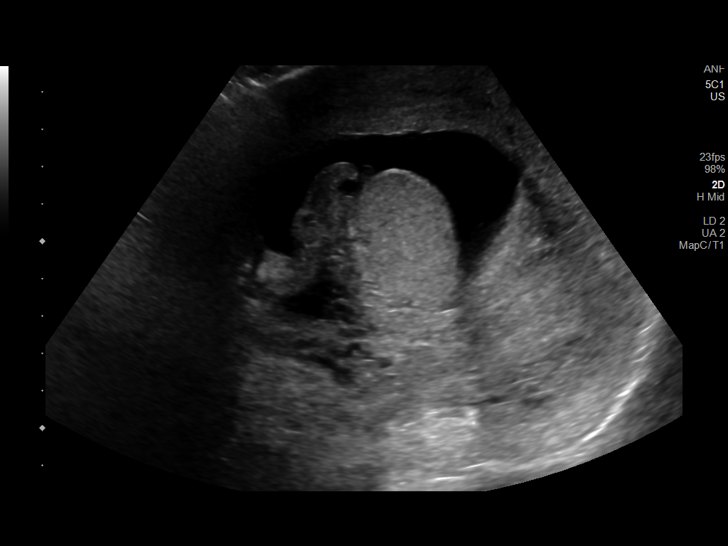
[im 15/58]
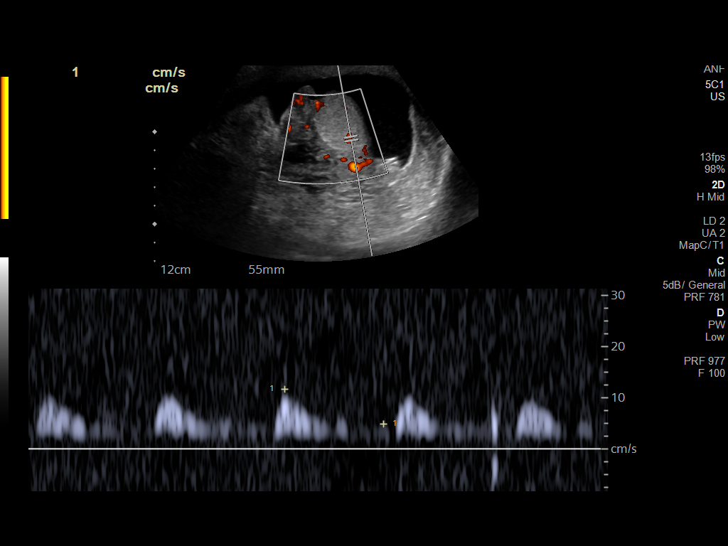
[im 20/58]
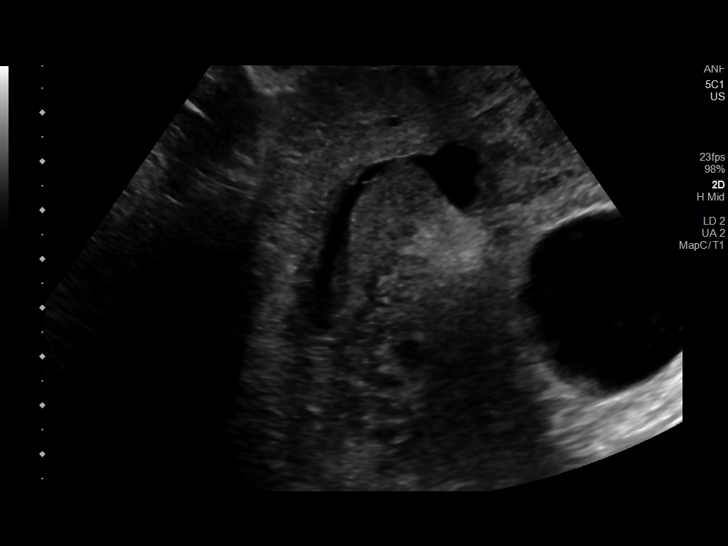
[im 24/58]
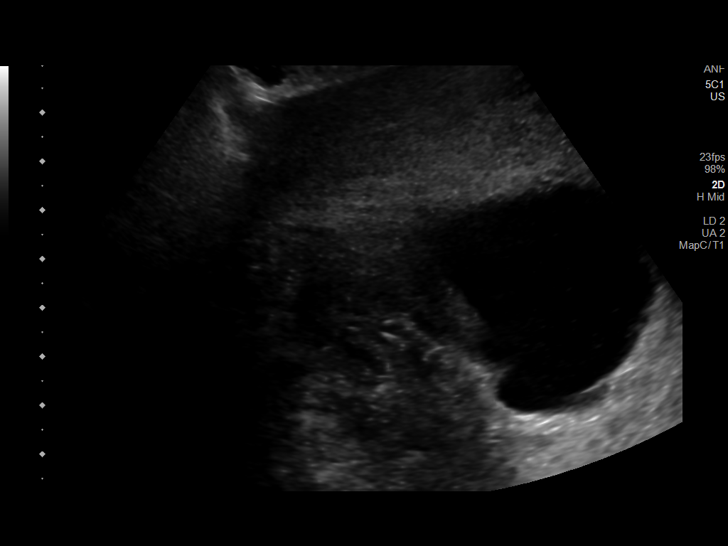
[im 29/58]
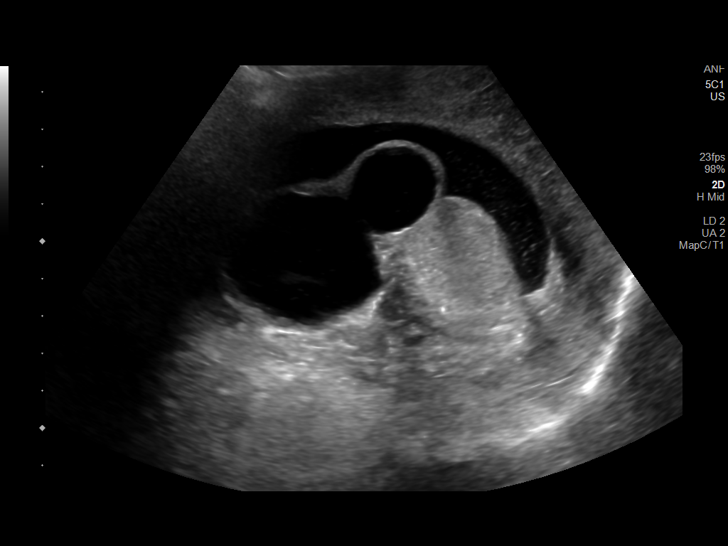
[im 34/58]
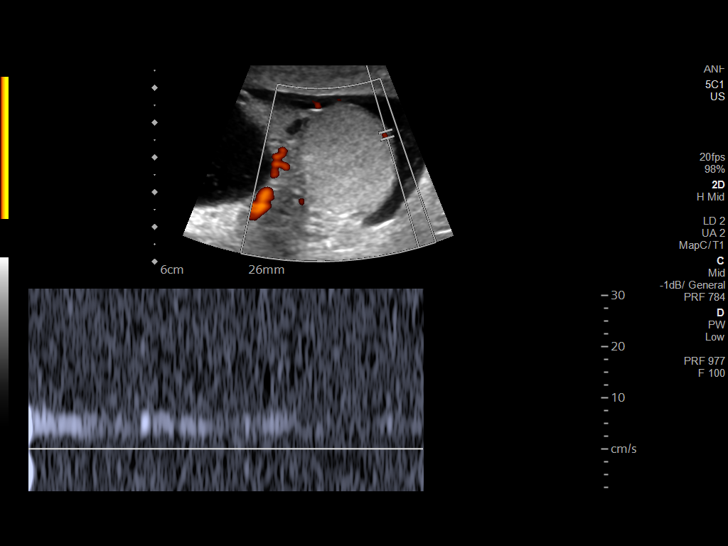
[im 39/58]
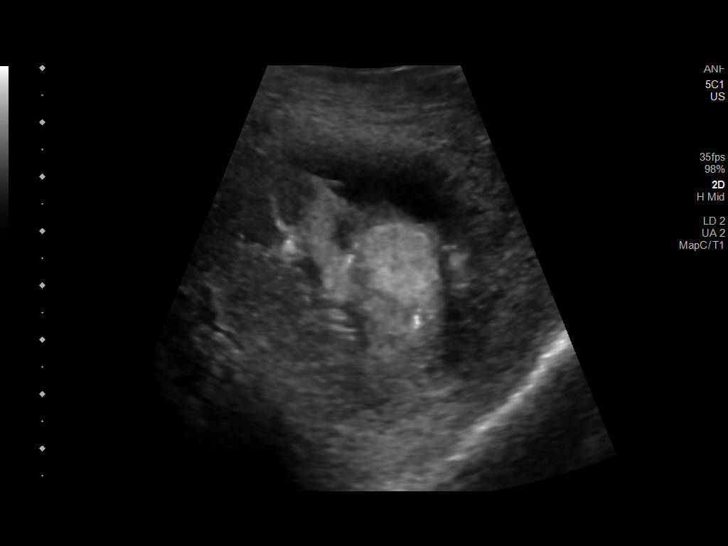
[im 43/58]
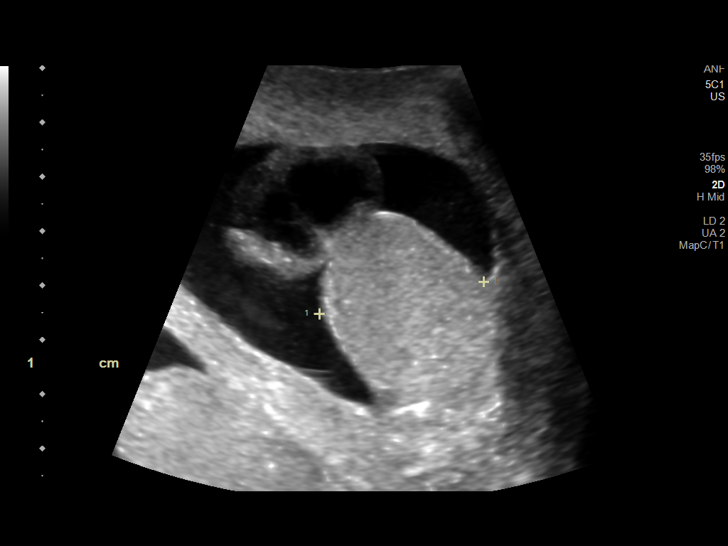
[im 48/58]
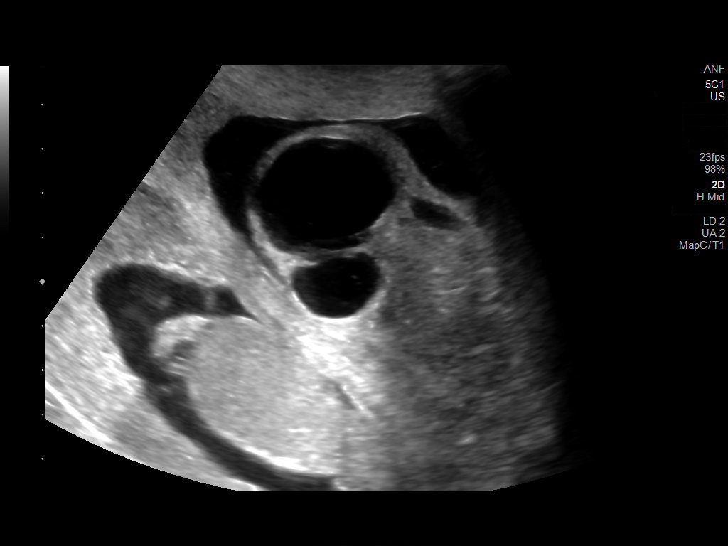
[im 53/58]
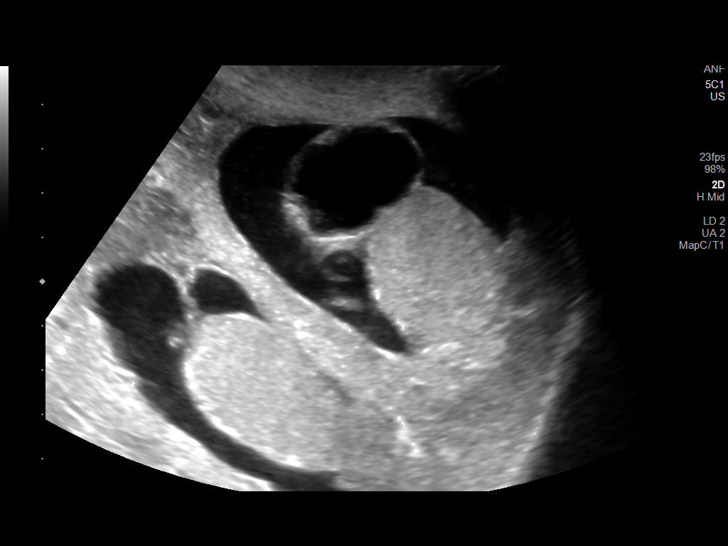
[im 58/58]
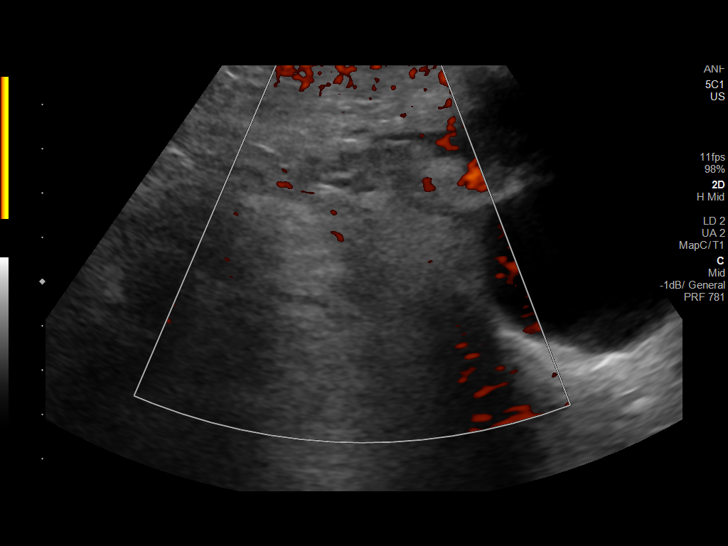

[13 of 25 positions shown; findings below may reference images not displayed]

FINDINGS: Right testicle

Measurements: 4.1 x 2.9 x 2.5 cm. Normal echogenicity without mass.
Few scattered tiny microcalcifications, nonspecific. Blood flow
present within RIGHT testis on color Doppler imaging.

Left testicle

Measurements: 4.0 x 2.6 x 3.1 cm. Normal echogenicity without mass
or calcifications. Few tiny microcalcifications. Blood flow present
within LEFT testis on color Doppler imaging, symmetric with RIGHT

Right epididymis:  Tiny cyst RIGHT epididymal head 5 mm diameter.

Left epididymis: Large loculated cysts adjacent to the LEFT testis,
favors spermatoceles versus epididymal cysts rather than loculated
hydrocele. These measure up to 5.6 x 4.0 x 3.4 cm.

Hydrocele:  BILATERAL hydroceles

Varicocele:  None identified

Pulsed Doppler interrogation of both testes demonstrates normal low
resistance arterial and venous waveforms bilaterally.

Examination limited secondary to body habitus.

Mild scrotal wall edema noted.
IMPRESSION: Normal appearing testes with small BILATERAL hydroceles.

Loculated cysts adjacent to LEFT testis question spermatoceles
versus epididymal cysts largest 5.6 cm in greatest size.

## 2019-11-18 ENCOUNTER — Other Ambulatory Visit (HOSPITAL_COMMUNITY): Payer: Self-pay

## 2019-11-18 ENCOUNTER — Telehealth (HOSPITAL_COMMUNITY): Payer: Self-pay | Admitting: Licensed Clinical Social Worker

## 2019-11-18 DIAGNOSIS — J9611 Chronic respiratory failure with hypoxia: Secondary | ICD-10-CM | POA: Diagnosis not present

## 2019-11-18 DIAGNOSIS — I5032 Chronic diastolic (congestive) heart failure: Secondary | ICD-10-CM | POA: Diagnosis not present

## 2019-11-18 NOTE — Progress Notes (Signed)
Paramedicine Encounter    Patient ID: Benjamin [name], male    DOB: Mar 17, 1966, 54 y.o.   MRN: [id number]   Patient Care Team: [name], NP as PCP - General (Nurse Practitioner) [name], MD as PCP - Cardiology (Cardiology) [name], LCSW as Social Worker (Licensed Clinical Social Worker)  Patient Active Problem List   Diagnosis Date Noted  . Acute on chronic diastolic CHF (congestive heart failure), NYHA class 3 ([place]) 07/30/2019  . Diabetes mellitus without complication ([place]) 02/40/9735  . Hypertensive emergency   . BRBPR (bright red blood per rectum) 06/12/2019  . Hypertension   . CHF (congestive heart failure) ([place])   . Sleep apnea   . Chronic respiratory failure ([place])   . Hypokalemia 05/29/2019  . GI bleed 05/29/2019  . Anemia 05/28/2019  . Acute on chronic diastolic CHF (congestive heart failure) ([place]) 04/11/2019  . Iron deficiency anemia 03/11/2019  . OSA on CPAP 03/11/2019  . HLD (hyperlipidemia) 03/11/2019  . Elevated troponin 03/11/2019  . GERD (gastroesophageal reflux disease) 03/11/2019  . Rectal bleeding 02/20/2019  . Dyspnea 02/06/2019  . COVID-19 virus infection 02/06/2019  . Bilateral lower extremity edema   . Morbid obesity with BMI of 50.0-59.9, adult ([place])   . Chronic diastolic CHF (congestive heart failure) ([place])   . PAF (paroxysmal atrial fibrillation) ([place])   . PE (pulmonary thromboembolism) ([place]) 01/21/2019  . Hypertensive urgency 01/21/2019  . Diabetes mellitus type 2 in obese ([place]) 01/21/2019    Current Outpatient Medications:  .  albuterol (VENTOLIN HFA) 108 (90 Base) MCG/ACT inhaler, Inhale 2 puffs into the lungs every 6 (six) hours as needed for wheezing or shortness of breath., Disp: 8.5 g, Rfl: 2 .  Blood Glucose Monitoring Suppl (TRUE METRIX METER) w/Device KIT, Use as instructed. Check blood glucose level by fingerstick twice per day., Disp: 1 kit, Rfl: 0 .  Blood Pressure Monitor DEVI, Please provide patient with  insurance approved blood pressure device with L-XL cuff. BMI 55, Disp: 1 each, Rfl: 0 .  carboxymethylcellulose (REFRESH PLUS) 0.5 % SOLN, Place 1 drop into both eyes daily. , Disp: , Rfl:  .  docusate sodium (COLACE) 100 MG capsule, Take 100 mg by mouth 2 (two) times daily., Disp: , Rfl:  .  glucose blood (TRUE METRIX BLOOD GLUCOSE TEST) test strip, Use as instructed. Check blood glucose level by fingerstick twice per day., Disp: 100 each, Rfl: 12 .  labetalol (NORMODYNE) 200 MG tablet, Take 2 tablets (400 mg total) by mouth 2 (two) times daily., Disp: 120 tablet, Rfl: 3 .  losartan (COZAAR) 100 MG tablet, Take 1 tablet (100 mg total) by mouth daily. (Patient taking differently: Take 100 mg by mouth at bedtime. ), Disp: 30 tablet, Rfl: 5 .  magnesium oxide (MAG-OX) 400 MG tablet, Take 2 tablets (800 mg total) by mouth every morning AND 1 tablet (400 mg total) every evening., Disp: 90 tablet, Rfl: 6 .  metolazone (ZAROXOLYN) 5 MG tablet, Take 1 tablet every Monday, Wednesday, and Friday., Disp: 15 tablet, Rfl: 3 .  Misc. Devices MISC, Requires O2 @ 3L/min continuously via nasal canula and home fill system,., Disp: 1 each, Rfl: 0 .  Misc. Devices MISC, Please provide Mr. Budlong with insurance approved portable O2 concentrator ICD 10 J96.11 Z99.81, Disp: 1 each, Rfl: 0 .  omeprazole (PRILOSEC) 20 MG capsule, TAKE ONE CAPSULE BY MOUTH ONCE DAILY, Disp: 30 capsule, Rfl: 2 .  potassium chloride SA (KLOR-CON) 20 MEQ tablet, Take  60 meq (3 tabs) in the morning, 40 meq (2 tabs) in the afternoon and 40 meq (2 tabs) in the evening.  Take an extra 2 tabs every dose of metolazone, Disp: 360 tablet, Rfl: 3 .  rivaroxaban (XARELTO) 20 MG TABS tablet, Take 1 tablet (20 mg total) by mouth daily with supper., Disp: 60 tablet, Rfl: 6 .  spironolactone (ALDACTONE) 25 MG tablet, Take 1 tablet (25 mg total) by mouth daily., Disp: 30 tablet, Rfl: 3 .  torsemide (DEMADEX) 100 MG tablet, Take 1 tablet (100 mg total) by  mouth 2 (two) times daily., Disp: 180 tablet, Rfl: 3 .  TRUEplus Lancets 28G MISC, Use as instructed. Check blood glucose level by fingerstick twice per day., Disp: 200 each, Rfl: 3 .  amLODipine (NORVASC) 10 MG tablet, Take 1 tablet (10 mg total) by mouth daily., Disp: 90 tablet, Rfl: 1 .  atorvastatin (LIPITOR) 20 MG tablet, Take 1 tablet (20 mg total) by mouth at bedtime., Disp: 90 tablet, Rfl: 2 .  ferrous sulfate 325 (65 FE) MG tablet, Take 1 tablet (325 mg total) by mouth daily. Patient will pick up scripts today., Disp: 90 tablet, Rfl: 0 .  levalbuterol (XOPENEX HFA) 45 MCG/ACT inhaler, Inhale 2 puffs into the lungs every 4 (four) hours as needed for wheezing. , Disp: , Rfl:  .  metFORMIN (GLUCOPHAGE) 500 MG tablet, Take 1 tablet (500 mg total) by mouth 2 (two) times daily with a meal. Patient will pick up scripts today., Disp: 180 tablet, Rfl: 1 No Known Allergies    Social History   Socioeconomic History  . Marital status: Single    Spouse name: Not on file  . Number of children: Not on file  . Years of education: Not on file  . Highest education level: Not on file  Occupational History  . Not on file  Tobacco Use  . Smoking status: Former Research scientist (life sciences)  . Smokeless tobacco: Never Used  Substance and Sexual Activity  . Alcohol use: Not Currently  . Drug use: Not Currently  . Sexual activity: Not Currently  Other Topics Concern  . Not on file  Social History Narrative  . Not on file   Social Determinants of Health   Financial Resource Strain:   . Difficulty of Paying Living Expenses: Not on file  Food Insecurity:   . Worried About Charity fundraiser in the Last Year: Not on file  . Ran Out of Food in the Last Year: Not on file  Transportation Needs:   . Lack of Transportation (Medical): Not on file  . Lack of Transportation (Non-Medical): Not on file  Physical Activity:   . Days of Exercise per Week: Not on file  . Minutes of Exercise per Session: Not on file  Stress:    . Feeling of Stress : Not on file  Social Connections:   . Frequency of Communication with Friends and Family: Not on file  . Frequency of Social Gatherings with Friends and Family: Not on file  . Attends Religious Services: Not on file  . Active Member of Clubs or Organizations: Not on file  . Attends Archivist Meetings: Not on file  . Marital Status: Not on file  Intimate Partner Violence:   . Fear of Current or Ex-Partner: Not on file  . Emotionally Abused: Not on file  . Physically Abused: Not on file  . Sexually Abused: Not on file    Physical Exam      Future  Appointments  Date Time Provider [place]  11/26/2019 11:00 AM [facility] PA/NP [facility] None    BP (!) 132/0   Pulse 90   Temp 98 F (36.7 C)   Resp 18   Wt (!) 391 lb (177.4 kg)   SpO2 96%   BMI 54.53 kg/m   Weight yesterday- Last visit weight-397  Pt reports he is doing good, his weight is down.  He fell asleep without CPAP last night but other than that he is doing better with using it nightly.  He missed one day of meds this week.  He denies any bleeding issues.  He just woke up not long ago before I got here, not taken his morning meds yet.   He wants to know when he starts the prep program- He also inquiring on his diabetic shoes.    --he needs amlodipine, atorvastatin, magnesium, omeprazole, xarelto  He wants to go to a foot doctor to get his toe nails and calluses removed. He was told by someone to go to one of the nail salons--I told him to get a doc to do that since he is diabetic and he has to be careful to not get infections. jenna got him appointment for that for next week.    -per [name] she thought [facility] on getting those shoes and also PREP program will reach out to him soon for that program.    [name], [place] Paramedic  11/18/19

## 2019-11-18 NOTE — Progress Notes (Signed)
Received notice that patient was waiting for me to be in contact for PREP Program. Called patient and reminded him of intake appt on 3/4 at 930a as well as start of PREP on the 9th.  Patient acknowledges understanding. Asked him to call me for further questions.  Called [facility] arranged with [facility] for 3/4 at 838a. They will contact him reference pick up time.

## 2019-11-18 NOTE — Telephone Encounter (Signed)
CSW received message from [employer] requesting assistance with patient concerns:   Needs help with diabetic care for his feet- CSW assisted pt is setting up appt at [facility] for this Wednesday for foot care  Help finding diabetic shoes- CSW left message for [facility] case worker Delice Bison for assistance and has spoken to multiple orthopedic offices to inquire- informed that [employer] in [place] provides diabetic shoes through insurance- left message to inquire  CSW will continue to follow and assist as needed  [name], LCSW Clinical Social Worker [facility] Desk#: [phone] Cell#: [phone]

## 2019-11-19 ENCOUNTER — Telehealth (HOSPITAL_COMMUNITY): Payer: Self-pay | Admitting: Licensed Clinical Social Worker

## 2019-11-19 NOTE — Telephone Encounter (Signed)
CSW received call back from [facility] in [place] who report they can assist patients with Medicaid in getting diabetic shoes- they faxed CSW referral form which has to be completed by his PCP.  CSW sent application to PCP office for completion.  CSW will continue to follow and assist as needed  [name], LCSW Clinical Social Worker [facility] Desk#: [phone] Cell#: [phone]

## 2019-11-20 ENCOUNTER — Ambulatory Visit: Payer: Medicaid Other | Admitting: Podiatry

## 2019-11-20 ENCOUNTER — Other Ambulatory Visit: Payer: Self-pay

## 2019-11-20 ENCOUNTER — Encounter: Payer: Self-pay | Admitting: Podiatry

## 2019-11-20 DIAGNOSIS — E1169 Type 2 diabetes mellitus with other specified complication: Secondary | ICD-10-CM

## 2019-11-20 DIAGNOSIS — E1140 Type 2 diabetes mellitus with diabetic neuropathy, unspecified: Secondary | ICD-10-CM | POA: Insufficient documentation

## 2019-11-20 DIAGNOSIS — E1142 Type 2 diabetes mellitus with diabetic polyneuropathy: Secondary | ICD-10-CM

## 2019-11-20 DIAGNOSIS — R600 Localized edema: Secondary | ICD-10-CM

## 2019-11-20 DIAGNOSIS — D689 Coagulation defect, unspecified: Secondary | ICD-10-CM | POA: Insufficient documentation

## 2019-11-20 NOTE — Progress Notes (Addendum)
Complaint:  Visit Type: Patient returns to my office for a diabetic foot exam for diabetic shoes.. Complaint:  Patient has been diagnosed with DM with no foot complications. The patient presents for preventative foot care services. Patient is presently taking xarelto.  He has been diagnosed with lower extremity swelling.  Patient says he has screws in his right ankle.  Podiatric Exam: Vascular: dorsalis pedis pulses are absent  B/L due to swelling.  Posterior tibial pulses are weakly palpable  B/L.   Capillary return is immediate. Temperature gradient is WNL. Skin turgor WNL  Sensorium: Absent  Semmes Weinstein monofilament test. Absent  tactile sensation bilaterally. Nail Exam: Pt has thick disfigured discolored nails with subungual debris noted bilateral entire nail hallux through fifth toenails Ulcer Exam: There is no evidence of ulcer or pre-ulcerative changes or infection. Orthopedic Exam: Muscle tone and strength are WNL. No limitations in general ROM. No crepitus or effusions noted. Foot type and digits show no abnormalities. Bony prominences are unremarkable. Skin: No Porokeratosis. No infection or ulcers.  Patient has complete plantar callus both feet.  Fissures noted both feet but especially right heel.  Diagnosis:  Onychomycosis, , Diabetes with neuropathy.    Treatment & Plan Procedures and Treatment: Consent by patient was obtained for treatment procedures.   . No ulceration, no infection noted.  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  Lotion was applied to his feet due to the dryness and callus plantar aspect both feet.  Told this patient to utilize vaseline for plantar foot application.  The absence of LOPS can be attributed to callus for this patient. Return Visit-Office Procedure: Patient instructed to return to the office for a follow up visit prn  for continued evaluation and treatment.    Helane Gunther DPM

## 2019-11-21 NOTE — Progress Notes (Signed)
[facility] PREP Progress Report   Patient Details  Name: Benjamin [name] MRN: [id number] Date of Birth: 1966/06/16 Age: 54 y.o. PCP: Claiborne Rigg, NP  Vitals:   11/21/19 1009  Pulse: 85  Resp: 20  SpO2: 96%  Weight: (!) 405 lb (183.7 kg)  Height: 5\' 11"  (1.803 m)     [facility] Eval - 11/21/19 1000      Referral    Referring Provider  [facility]    Reason for referral  Heart Failure;Diabetes;Hypertension;Inactivity;Obesitity/Overweight      Measurement   Neck measurement  23 Inches    Body fat  --   unable to calculate     Information for Trainer   Goals  Activity, weight loss, back pain reliev    Current Exercise  min walk    Orthopedic Concerns  feet     Pertinent Medical History  DM2, Covid w PEs, CHF, HTN    Current Barriers  endurance and O2    Restrictions/Precautions  Fall risk;Diabetic snack before exercise    Medications that affect exercise  Oxgen;Beta blocker;Medication causing dizziness/drowsiness      Timed Up and Go (TUGS)   Timed Up and Go  Moderate risk 10-12 seconds      Mobility and Daily Activities   I find it easy to walk up or down two or more flights of stairs.  1    I have no trouble taking out the trash.  2    I do housework such as vacuuming and dusting on my own without difficulty.  4    I can easily lift a gallon of milk (8lbs).  4    I can easily walk a mile.  1    I have no trouble reaching into high cupboards or reaching down to pick up something from the floor.  2    I do not have trouble doing out-door work such as 01/21/20, raking leaves, or gardening.  1      Mobility and Daily Activities   I feel younger than my age.  1    I feel independent.  1    I feel energetic.  1    I live an active life.   1    I feel strong.  1    I feel healthy.  1    I feel active as other people my age.  1      How fit and strong are you.   Fit and Strong Total Score  22      Past Medical History:  Diagnosis Date  .  A-fib (HCC)   . Anemia 05/29/2019  . CHF (congestive heart failure) (HCC)   . Chronic respiratory failure (HCC)   . Diabetes mellitus without complication (HCC)   . Dyspnea   . Elevated troponin 02/06/2019  . Hypertension   . Obesity   . Pulmonary embolism (HCC)   . Rectal bleeding 05/29/2019  . Sleep apnea    Past Surgical History:  Procedure Laterality Date  . COLONOSCOPY WITH PROPOFOL Left 02/25/2019   Procedure: COLONOSCOPY WITH PROPOFOL;  Surgeon: 04/27/2019, MD;  Location: [facility] ENDOSCOPY;  Service: Endoscopy;  Laterality: Left;  . ESOPHAGOGASTRODUODENOSCOPY (EGD) WITH PROPOFOL Left 02/25/2019   Procedure: ESOPHAGOGASTRODUODENOSCOPY (EGD) WITH PROPOFOL;  Surgeon: 04/27/2019, MD;  Location: [facility] ENDOSCOPY;  Service: Endoscopy;  Laterality: Left;  . ESOPHAGOGASTRODUODENOSCOPY (EGD) WITH PROPOFOL N/A 06/12/2019   Procedure: ESOPHAGOGASTRODUODENOSCOPY (EGD) WITH PROPOFOL;  Surgeon: 06/14/2019,  Altamese Dilling, MD;  Location: [place];  Service: Gastroenterology;  Laterality: N/A;  . FLEXIBLE SIGMOIDOSCOPY N/A 06/12/2019   Procedure: FLEXIBLE SIGMOIDOSCOPY;  Surgeon: Clarene Essex, MD;  Location: [place];  Service: Gastroenterology;  Laterality: N/A;  . HEMORRHOID BANDING  05/2019  . NO PAST SURGERIES    . RIGHT HEART CATH N/A 04/17/2019   Procedure: RIGHT HEART CATH;  Surgeon: Larey Dresser, MD;  Location: [place] CV LAB;  Service: Cardiovascular;  Laterality: N/A;   Social History   Tobacco Use  Smoking Status Former Smoker  Smokeless Tobacco Never Used     PREP will start 11/26/19 class every T/TH at 230p-345p x 12 wks Will assist with transportation    [employer] 11/21/2019, 10:14 AM

## 2019-11-22 ENCOUNTER — Telehealth: Payer: Self-pay

## 2019-11-22 NOTE — Telephone Encounter (Signed)
Transportation arranged for next week for PREP program. Tues/Thurs 230pm at [facility] location. [facility] will contact patient to confirm.

## 2019-11-25 ENCOUNTER — Other Ambulatory Visit (HOSPITAL_COMMUNITY): Payer: Self-pay

## 2019-11-25 NOTE — Progress Notes (Signed)
Paramedicine Encounter    Patient ID: Benjamin [name], male    DOB: Apr 24, 1966, 54 y.o.   MRN: [id number]   Patient Care Team: [name], NP as PCP - General (Nurse Practitioner) [name], MD as PCP - Cardiology (Cardiology) [name], LCSW as Social Worker (Licensed Clinical Social Worker)  Patient Active Problem List   Diagnosis Date Noted  . Diabetic neuropathy ([place]) 11/20/2019  . Coagulation disorder ([place]) 11/20/2019  . Acute on chronic diastolic CHF (congestive heart failure), NYHA class 3 ([place]) 07/30/2019  . Diabetes mellitus without complication ([place]) 80/99/8338  . Hypertensive emergency   . BRBPR (bright red blood per rectum) 06/12/2019  . Hypertension   . CHF (congestive heart failure) ([place])   . Sleep apnea   . Chronic respiratory failure ([place])   . Hypokalemia 05/29/2019  . GI bleed 05/29/2019  . Anemia 05/28/2019  . Acute on chronic diastolic CHF (congestive heart failure) ([place]) 04/11/2019  . Iron deficiency anemia 03/11/2019  . OSA on CPAP 03/11/2019  . HLD (hyperlipidemia) 03/11/2019  . Elevated troponin 03/11/2019  . GERD (gastroesophageal reflux disease) 03/11/2019  . Rectal bleeding 02/20/2019  . Dyspnea 02/06/2019  . COVID-19 virus infection 02/06/2019  . Bilateral lower extremity edema   . Morbid obesity with BMI of 50.0-59.9, adult ([place])   . Chronic diastolic CHF (congestive heart failure) ([place])   . PAF (paroxysmal atrial fibrillation) ([place])   . PE (pulmonary thromboembolism) ([place]) 01/21/2019  . Hypertensive urgency 01/21/2019  . Diabetes mellitus type 2 in obese ([place]) 01/21/2019    Current Outpatient Medications:  .  albuterol (VENTOLIN HFA) 108 (90 Base) MCG/ACT inhaler, Inhale 2 puffs into the lungs every 6 (six) hours as needed for wheezing or shortness of breath., Disp: 8.5 g, Rfl: 2 .  Blood Glucose Monitoring Suppl (TRUE METRIX METER) w/Device KIT, Use as instructed. Check blood glucose level by fingerstick twice per day., Disp:  1 kit, Rfl: 0 .  Blood Pressure Monitor DEVI, Please provide patient with insurance approved blood pressure device with L-XL cuff. BMI 55, Disp: 1 each, Rfl: 0 .  carboxymethylcellulose (REFRESH PLUS) 0.5 % SOLN, Place 1 drop into both eyes daily. , Disp: , Rfl:  .  docusate sodium (COLACE) 100 MG capsule, Take 100 mg by mouth 2 (two) times daily., Disp: , Rfl:  .  glucose blood (TRUE METRIX BLOOD GLUCOSE TEST) test strip, Use as instructed. Check blood glucose level by fingerstick twice per day., Disp: 100 each, Rfl: 12 .  labetalol (NORMODYNE) 200 MG tablet, Take 2 tablets (400 mg total) by mouth 2 (two) times daily., Disp: 120 tablet, Rfl: 3 .  losartan (COZAAR) 100 MG tablet, Take 1 tablet (100 mg total) by mouth daily. (Patient taking differently: Take 100 mg by mouth at bedtime. ), Disp: 30 tablet, Rfl: 5 .  magnesium oxide (MAG-OX) 400 MG tablet, Take 2 tablets (800 mg total) by mouth every morning AND 1 tablet (400 mg total) every evening., Disp: 90 tablet, Rfl: 6 .  metolazone (ZAROXOLYN) 5 MG tablet, Take 1 tablet every Monday, Wednesday, and Friday., Disp: 15 tablet, Rfl: 3 .  Misc. Devices MISC, Requires O2 @ 3L/min continuously via nasal canula and home fill system,., Disp: 1 each, Rfl: 0 .  Misc. Devices MISC, Please provide Mr. Hasz with insurance approved portable O2 concentrator ICD 10 J96.11 Z99.81, Disp: 1 each, Rfl: 0 .  omeprazole (PRILOSEC) 20 MG capsule, TAKE ONE CAPSULE BY MOUTH ONCE DAILY, Disp: 30 capsule,  Rfl: 2 .  potassium chloride SA (KLOR-CON) 20 MEQ tablet, Take 60 meq (3 tabs) in the morning, 40 meq (2 tabs) in the afternoon and 40 meq (2 tabs) in the evening.  Take an extra 2 tabs every dose of metolazone, Disp: 360 tablet, Rfl: 3 .  rivaroxaban (XARELTO) 20 MG TABS tablet, Take 1 tablet (20 mg total) by mouth daily with supper., Disp: 60 tablet, Rfl: 6 .  spironolactone (ALDACTONE) 25 MG tablet, Take 1 tablet (25 mg total) by mouth daily., Disp: 30 tablet, Rfl:  3 .  torsemide (DEMADEX) 100 MG tablet, Take 1 tablet (100 mg total) by mouth 2 (two) times daily., Disp: 180 tablet, Rfl: 3 .  TRUEplus Lancets 28G MISC, Use as instructed. Check blood glucose level by fingerstick twice per day., Disp: 200 each, Rfl: 3 .  amLODipine (NORVASC) 10 MG tablet, Take 1 tablet (10 mg total) by mouth daily., Disp: 90 tablet, Rfl: 1 .  atorvastatin (LIPITOR) 20 MG tablet, Take 1 tablet (20 mg total) by mouth at bedtime., Disp: 90 tablet, Rfl: 2 .  ferrous sulfate 325 (65 FE) MG tablet, Take 1 tablet (325 mg total) by mouth daily. Patient will pick up scripts today., Disp: 90 tablet, Rfl: 0 .  levalbuterol (XOPENEX HFA) 45 MCG/ACT inhaler, Inhale 2 puffs into the lungs every 4 (four) hours as needed for wheezing. , Disp: , Rfl:  .  metFORMIN (GLUCOPHAGE) 500 MG tablet, Take 1 tablet (500 mg total) by mouth 2 (two) times daily with a meal. Patient will pick up scripts today., Disp: 180 tablet, Rfl: 1 No Known Allergies    Social History   Socioeconomic History  . Marital status: Single    Spouse name: Not on file  . Number of children: Not on file  . Years of education: Not on file  . Highest education level: Not on file  Occupational History  . Not on file  Tobacco Use  . Smoking status: Former Research scientist (life sciences)  . Smokeless tobacco: Never Used  Substance and Sexual Activity  . Alcohol use: Not Currently  . Drug use: Not Currently  . Sexual activity: Not Currently  Other Topics Concern  . Not on file  Social History Narrative  . Not on file   Social Determinants of Health   Financial Resource Strain: Medium Risk  . Difficulty of Paying Living Expenses: Somewhat hard  Food Insecurity: Food Insecurity Present  . Worried About Charity fundraiser in the Last Year: Sometimes true  . Ran Out of Food in the Last Year: Sometimes true  Transportation Needs: No Transportation Needs  . Lack of Transportation (Medical): No  . Lack of Transportation (Non-Medical): No   Physical Activity:   . Days of Exercise per Week: Not on file  . Minutes of Exercise per Session: Not on file  Stress:   . Feeling of Stress : Not on file  Social Connections:   . Frequency of Communication with Friends and Family: Not on file  . Frequency of Social Gatherings with Friends and Family: Not on file  . Attends Religious Services: Not on file  . Active Member of Clubs or Organizations: Not on file  . Attends Archivist Meetings: Not on file  . Marital Status: Not on file  Intimate Partner Violence:   . Fear of Current or Ex-Partner: Not on file  . Emotionally Abused: Not on file  . Physically Abused: Not on file  . Sexually Abused: Not on file  Physical Exam      Future Appointments  Date Time Provider [place]  11/26/2019 11:00 AM [facility] PA/NP [facility] None    BP (!) 180/0   Pulse 86   Resp 18   SpO2 95%   Weight yesterday-396 Last visit weight-391  Pt reports there was some chaos this morning when his PO came to see him and found the house full of marijuana smoke and a lot of paraphernalia in his "roommates" possession and the PO did call landlord about the situation. The landlord was speaking to pt while I was there on phone and was told that he had to contact his lawyer to pursue things further with this issue.  So pt has been under a lot of stress. There has been an issue with his deposit that occurred on 2/26, he went to bank this wknd to get his rent money and his balance was $1.17 and he has not went there to take out any money. I told him to get to his bank and get copies of his bank statements from January until present to see exactly where his money has been going.  Weight doing ok, he did not weigh this morning due to the situation in the house today.  He missed last mondays dose of meds.  Pt took meds this morning.  meds verified and pill box refilled. No edema noted.    Need to refill these meds: --labetalol, losartan,  potassium, spiro, stool softener  [name], EMT-Paramedic [place] Paramedic  11/25/19

## 2019-11-26 ENCOUNTER — Other Ambulatory Visit: Payer: Self-pay

## 2019-11-26 ENCOUNTER — Encounter (HOSPITAL_COMMUNITY): Payer: Self-pay

## 2019-11-26 ENCOUNTER — Other Ambulatory Visit (HOSPITAL_COMMUNITY): Payer: Self-pay

## 2019-11-26 ENCOUNTER — Ambulatory Visit (HOSPITAL_COMMUNITY)
Admission: RE | Admit: 2019-11-26 | Discharge: 2019-11-26 | Disposition: A | Discharge status: Home / Self Care | Payer: Medicaid Other | Source: Ambulatory Visit | Attending: Adult Health | Admitting: Adult Health

## 2019-11-26 VITALS — BP 162/100 | HR 98 | Wt >= 6400 oz

## 2019-11-26 DIAGNOSIS — Z7901 Long term (current) use of anticoagulants: Secondary | ICD-10-CM | POA: Insufficient documentation

## 2019-11-26 DIAGNOSIS — Z79899 Other long term (current) drug therapy: Secondary | ICD-10-CM | POA: Diagnosis not present

## 2019-11-26 DIAGNOSIS — I480 Paroxysmal atrial fibrillation: Secondary | ICD-10-CM | POA: Insufficient documentation

## 2019-11-26 DIAGNOSIS — Z8616 Personal history of COVID-19: Secondary | ICD-10-CM | POA: Diagnosis not present

## 2019-11-26 DIAGNOSIS — K219 Gastro-esophageal reflux disease without esophagitis: Secondary | ICD-10-CM | POA: Diagnosis not present

## 2019-11-26 DIAGNOSIS — Z8249 Family history of ischemic heart disease and other diseases of the circulatory system: Secondary | ICD-10-CM | POA: Diagnosis not present

## 2019-11-26 DIAGNOSIS — G4733 Obstructive sleep apnea (adult) (pediatric): Secondary | ICD-10-CM | POA: Insufficient documentation

## 2019-11-26 DIAGNOSIS — Z87891 Personal history of nicotine dependence: Secondary | ICD-10-CM | POA: Insufficient documentation

## 2019-11-26 DIAGNOSIS — Z7984 Long term (current) use of oral hypoglycemic drugs: Secondary | ICD-10-CM | POA: Insufficient documentation

## 2019-11-26 DIAGNOSIS — Z86711 Personal history of pulmonary embolism: Secondary | ICD-10-CM | POA: Diagnosis not present

## 2019-11-26 DIAGNOSIS — E119 Type 2 diabetes mellitus without complications: Secondary | ICD-10-CM | POA: Diagnosis not present

## 2019-11-26 DIAGNOSIS — I5033 Acute on chronic diastolic (congestive) heart failure: Secondary | ICD-10-CM | POA: Diagnosis not present

## 2019-11-26 DIAGNOSIS — I110 Hypertensive heart disease with heart failure: Secondary | ICD-10-CM | POA: Diagnosis not present

## 2019-11-26 DIAGNOSIS — I10 Essential (primary) hypertension: Secondary | ICD-10-CM | POA: Diagnosis not present

## 2019-11-26 DIAGNOSIS — Z6841 Body Mass Index (BMI) 40.0 and over, adult: Secondary | ICD-10-CM | POA: Diagnosis not present

## 2019-11-26 DIAGNOSIS — I5032 Chronic diastolic (congestive) heart failure: Secondary | ICD-10-CM | POA: Diagnosis not present

## 2019-11-26 LAB — BASIC METABOLIC PANEL
Anion gap: 9 (ref 5–15)
BUN: 14 mg/dL (ref 6–20)
CO2: 35 mmol/L — ABNORMAL HIGH (ref 22–32)
Calcium: 8.6 mg/dL — ABNORMAL LOW (ref 8.9–10.3)
Chloride: 95 mmol/L — ABNORMAL LOW (ref 98–111)
Creatinine, Ser: 1.22 mg/dL (ref 0.61–1.24)
GFR calc Af Amer: >60 mL/min (ref >60)
GFR calc non Af Amer: >60 mL/min (ref >60)
Glucose, Bld: 107 mg/dL — ABNORMAL HIGH (ref 70–99)
Potassium: 2.9 mmol/L — ABNORMAL LOW (ref 3.5–5.1)
Sodium: 139 mmol/L (ref 135–145)

## 2019-11-26 MED ORDER — SPIRONOLACTONE 50 MG PO TABS: 50.0000 mg | ORAL_TABLET | Freq: Every day | ORAL | 3 refills | Status: DC

## 2019-11-26 NOTE — Progress Notes (Signed)
[facility] PREP Weekly Session   Patient Details  Name: Benjamin [name] MRN: [id number] Date of Birth: [birth year] Age: 54 y.o. PCP: Claiborne Rigg, NP  There were no vitals filed for this visit.  [facility] Weekly seesion - 11/26/19 2000      Weekly Session   Topic Discussed  --   scale of perceived exertion   Comments  orient to gym         [name] 11/26/2019, 8:08 PM

## 2019-11-26 NOTE — Progress Notes (Signed)
Paramedicine Encounter   Patient ID: Benjamin [name] , male,   DOB: June 19, 1966,54 y.o.,  MRN: [id number]   Met patient in clinic today with provider.   Weight @ clinic-400 B/p-162/100 p-98 sp02-94  Pt feels like he is forgetting stuff a lot. Legs look good today.  Increase spiro to 32m daily.  jenna called bank about his financial issues.  He is suppose to call me once he getshome from his workout program so I can make that med change.  I will go by to /u colace for him as well.   KMarylouise Stacks ECacao3/05/2020

## 2019-11-26 NOTE — Patient Instructions (Signed)
Lab work done today. We will notify you of any abnormal lab work. No news is good news!  INCREASE Spironolactone to 50mg  tab daily.  Please follow up with the [facility] Failure Clinic in 4 weeks.  At the [facility], you and your health needs are our priority. As part of our continuing mission to provide you with exceptional heart care, we have created designated Provider Care Teams. These Care Teams include your primary Cardiologist (physician) and Advanced Practice Providers (APPs- Physician Assistants and Nurse Practitioners) who all work together to provide you with the care you need, when you need it.   You may see any of the following providers on your designated Care Team at your next follow up: Dr Marland Kitchen . Dr Arvilla Meres . Marca Ancona, NP . Tonye Becket, PA . Robbie Lis, PharmD   Please be sure to bring in all your medications bottles to every appointment.

## 2019-11-26 NOTE — Progress Notes (Signed)
Advanced [facility] Failure Clinic Note   Referring Physician: PCP: Benjamin Pounds, NP PCP-Cardiologist: Benjamin Carnes, MD  [facility]: Dr. Aundra [name]   HPI: [name] a 54 yo with hx of diastolic CHF, HTN, DM, Atrial fib, PE in April 2020 (on Xarelto), OSA, GERD,chronic elevation of troponin.  Admitted 01/21/2019 with increased dyspnea and had PE. He was anticoagulated.   Patient re-admitted 5/20 for syncope/cough/dyspnea, COVID+, CT positive for bilateral GGOs, admitted to [facility] and discharged after 6 days, on O2. Rec'd Cards f/u.  Patient re-admitted 02/20/19 for bright red blood per rectum, eval'ed by GI, had EGD/colon, dx'd with hemorrhoidal bleeding, discharged after 9 days on O2.He was again Covid + .   Readmitted 03/11/19 with increased shortness of breath. He did not have home oxygen. He was discharged to home the next day.  He [facility] ED[facility]20with lower extremity edema. COVID negative. Diuresed with lasix drip and transitioned to torsemide 40 mg twice daily. Had [place] with mild volume overload and preserved cardiac output. Discharge weight was 407.9 [name].   He was readmitted with symptomatic anemia 05/28/19 with hgb 7.1. Received 1UPRBCs. GI consulted. He started on anusol and continued on stool softner.  He was readmitted to the hospital on 07/16/19 when he presented to the ED w/ worsening symptoms and was admitted by IM for a/c diastolic CHF and treated w/ IV Lasix. Had 2 day hospital stay and was discharged on 10/29. Echo was done 10/28 and showed normal LVEF 65-70%, RV interpreted as normal but suspect significant RV dysfunction.  After diuresis w/ IV Lasix, he was placed back on home diuretic regimen, torsemide 100 mg bid. Lisinopril 5 mg was also added to regimen. AHF team was not consulted that admit.  He was seen back in clinic 07/30/19. Felt poorly. SOB at rest and worse w/ exertion. Also w/ orthopnea/PND. Significant wt gain of 20 lb up to 428 lb (dry wt ~407 lb),  w/ marked abdominal distention and LEE and poor response to IV diuretics.  Once diuresed transitioned to torsemide 100 mg twice a day. D/C 408 [name].   Zio patch on 10/20/19. No arrhythmia.   Today he returns for HF follow up.Overall feeling fine but stressed about his living situation. Remains SOB with exertion but a little better. Denies PND/Orthopnea. Remains on 2 liters oxygen. Using CPAP. Appetite ok. No fever or chills. Weight at home 394-396 [name]. Taking all medications. Missing medications about once a week. Followed by HF Paramedicine. Attending [employer] program.   Labs (11/20): BNP 32, K 3.6, creatinine 1.09 Labs (12/20): K 3.3, creatinine 1.28 Labs (10/10/19): K 3.9 Creatinine 1.4  Labs (11/05/19) : K 3.2 Creatinine 1.47   Review of Systems: All systems reviewed and negative except as per HPI.   PMH: 1. Atrial fibrillation: Paroxysmal.  2. Pulmonary embolus: 5/20.  3. OHS/OSA: He is on home oxygen during the day and uses CPAP at night.  4. Morbid obesity.  5. Chronic diastolic CHF:  - RHC (6/75): mean RA 12, PA 40/25 mean 31, mean PCWP 23, CI 2.47, PVR 1.1 WU.  - Echo (10/20): EF 65-70%, mild LVH, normal RV size and systolic function.  6. Type 2 diabetes 7. HTN 8. Rectal bleeding: ?Hemorrhoidal.  9. COVID-19 infection 7/20.  1-. ZIo Patch - 10/20/19 no arrhythmias  Current Outpatient Medications  Medication Sig Dispense Refill  . albuterol (VENTOLIN HFA) 108 (90 Base) MCG/ACT inhaler Inhale 2 puffs into the lungs every 6 (six) hours as needed for wheezing or shortness  of breath. 8.5 g 2  . amLODipine (NORVASC) 10 MG tablet Take 1 tablet (10 mg total) by mouth daily. 90 tablet 1  . atorvastatin (LIPITOR) 20 MG tablet Take 1 tablet (20 mg total) by mouth at bedtime. 90 tablet 2  . Blood Glucose Monitoring Suppl (TRUE METRIX METER) w/Device KIT Use as instructed. Check blood glucose level by fingerstick twice per day. 1 kit 0  . Blood Pressure Monitor DEVI Please provide  patient with insurance approved blood pressure device with L-XL cuff. BMI 55 1 each 0  . carboxymethylcellulose (REFRESH PLUS) 0.5 % SOLN Place 1 drop into both eyes daily.     Marland Kitchen docusate sodium (COLACE) 100 MG capsule Take 100 mg by mouth 2 (two) times daily.    . ferrous sulfate 325 (65 FE) MG tablet Take 1 tablet (325 mg total) by mouth daily. Patient will pick up scripts today. 90 tablet 0  . glucose blood (TRUE METRIX BLOOD GLUCOSE TEST) test strip Use as instructed. Check blood glucose level by fingerstick twice per day. 100 each 12  . labetalol (NORMODYNE) 200 MG tablet Take 2 tablets (400 mg total) by mouth 2 (two) times daily. 120 tablet 3  . levalbuterol (XOPENEX HFA) 45 MCG/ACT inhaler Inhale 2 puffs into the lungs every 4 (four) hours as needed for wheezing.     Marland Kitchen losartan (COZAAR) 100 MG tablet Take 1 tablet (100 mg total) by mouth daily. 30 tablet 5  . magnesium oxide (MAG-OX) 400 MG tablet Take 2 tablets (800 mg total) by mouth every morning AND 1 tablet (400 mg total) every evening. 90 tablet 6  . metFORMIN (GLUCOPHAGE) 500 MG tablet Take 1 tablet (500 mg total) by mouth 2 (two) times daily with a meal. Patient will pick up scripts today. 180 tablet 1  . metolazone (ZAROXOLYN) 5 MG tablet Take 1 tablet every Monday, Wednesday, and Friday. 15 tablet 3  . Misc. Devices MISC Requires O2 @ 3L/min continuously via nasal canula and home fill system,. 1 each 0  . Misc. Devices MISC Please provide Benjamin [name] with insurance approved portable O2 concentrator ICD 10 J96.11 Z99.81 1 each 0  . omeprazole (PRILOSEC) 20 MG capsule TAKE ONE CAPSULE BY MOUTH ONCE DAILY 30 capsule 2  . potassium chloride SA (KLOR-CON) 20 MEQ tablet Take 60 meq (3 tabs) in the morning, 40 meq (2 tabs) in the afternoon and 40 meq (2 tabs) in the evening.  Take an extra 2 tabs every dose of metolazone 360 tablet 3  . rivaroxaban (XARELTO) 20 MG TABS tablet Take 1 tablet (20 mg total) by mouth daily with supper. 60 tablet  6  . spironolactone (ALDACTONE) 25 MG tablet Take 1 tablet (25 mg total) by mouth daily. 30 tablet 3  . torsemide (DEMADEX) 100 MG tablet Take 1 tablet (100 mg total) by mouth 2 (two) times daily. 180 tablet 3  . TRUEplus Lancets 28G MISC Use as instructed. Check blood glucose level by fingerstick twice per day. 200 each 3   No current facility-administered medications for this encounter.    No Known Allergies    Social History   Socioeconomic History  . Marital status: Single    Spouse name: Not on file  . Number of children: Not on file  . Years of education: Not on file  . Highest education level: Not on file  Occupational History  . Not on file  Tobacco Use  . Smoking status: Former Research scientist (life sciences)  . Smokeless tobacco: Never  Used  Substance and Sexual Activity  . Alcohol use: Not Currently  . Drug use: Not Currently  . Sexual activity: Not Currently  Other Topics Concern  . Not on file  Social History Narrative  . Not on file   Social Determinants of Health   Financial Resource Strain: Medium Risk  . Difficulty of Paying Living Expenses: Somewhat hard  Food Insecurity: Food Insecurity Present  . Worried About Charity fundraiser in the Last Year: Sometimes true  . Ran Out of Food in the Last Year: Sometimes true  Transportation Needs: No Transportation Needs  . Lack of Transportation (Medical): No  . Lack of Transportation (Non-Medical): No  Physical Activity:   . Days of Exercise per Week: Not on file  . Minutes of Exercise per Session: Not on file  Stress:   . Feeling of Stress : Not on file  Social Connections:   . Frequency of Communication with Friends and Family: Not on file  . Frequency of Social Gatherings with Friends and Family: Not on file  . Attends Religious Services: Not on file  . Active Member of Clubs or Organizations: Not on file  . Attends Archivist Meetings: Not on file  . Marital Status: Not on file  Intimate Partner Violence:   .  Fear of Current or Ex-Partner: Not on file  . Emotionally Abused: Not on file  . Physically Abused: Not on file  . Sexually Abused: Not on file      Family History  Problem Relation Age of Onset  . Hypertension Mother   . Diabetes Mother     Vitals:   11/26/19 1032  BP: (!) 162/100  Pulse: 98  SpO2: 94%  Weight: (!) 181.4 kg (400 lb)   Wt Readings from Last 3 Encounters:  11/26/19 (!) 181.4 kg (400 lb)  11/21/19 (!) 183.7 kg (405 lb)  11/18/19 (!) 177.4 kg (391 lb)     PHYSICAL EXAM: General:  Well appearing. No resp difficulty HEENT: normal Neck: supple. no JVD. Carotids 2+ bilat; no bruits. No lymphadenopathy or thryomegaly appreciated. Cor: PMI nondisplaced. Regular rate & rhythm. No rubs, gallops or murmurs. Lungs: clear on 2 liters [place] Abdomen: obese, soft, nontender, nondistended. No hepatosplenomegaly. No bruits or masses. Good bowel sounds. Extremities: no cyanosis, clubbing, rash, edema Neuro: alert & orientedx3, cranial nerves grossly intact. moves all 4 extremities w/o difficulty. Affect pleasant   ASSESSMENT & PLAN:  1. Chronic diastolic CHF: Echo in 94/70 with EF 65-70%, RV interpreted as normal but suspect significant RV dysfunction.  -NYHA III which is his baseline. . Volume status stable.  - Continue torsemide 100 mg bid and metolazone 2.5 mg three times a week.  -Continue current dose of potassium  -Increase spironolactone to 50 mg daily. Check BME today and in 7 days.  2. Atrial fibrillation: Paroxysmal.  - Regular on exam. No bleeding issues.   - Continue Xarelto.  3. H/o PE: 5/20, continue Xarelto.  4. OHS/OSA: Continue CPAP at night and oxygen during the day. - Will refer to pulmonary rehab.  - Continue CPAP.  5. HTN: Elevated. Increase spiro as noted ab 6. "Falling out" spells:  Resolved using CPAP.  Zio Patch  No  arrhythmias  7. Obesity Body mass index is 55.79 kg/m.  Discussed portion control.   May need to consider Cardiomems  monitor fluid status at home. Last hospital admit was 07/2019.  Follow up in 4 weeks. Continue HF Paramedicine. Greater than 50% of  the (total minutes 25) visit spent in counseling/coordination of care regarding the above to include medication change.    Benjamin Garzon NP-C  11/26/2019 10:44 AM
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
## 2019-11-27 ENCOUNTER — Telehealth (HOSPITAL_COMMUNITY): Payer: Self-pay

## 2019-11-27 NOTE — Telephone Encounter (Signed)
I told pt to call me once he got home yesterday from his workout so I could come out and make the med change from his clinic visit, but he never did.  I called him today and he was at his mamas house. I will have to try again tomor.   Kerry Hough, EMT-Paramedic  11/27/19

## 2019-11-28 ENCOUNTER — Other Ambulatory Visit (HOSPITAL_COMMUNITY): Payer: Self-pay

## 2019-11-28 NOTE — Progress Notes (Signed)
Came out today to increase pts spiro per last clinic visit, wanted to go by the other day but pt was at his moms house. Also got him more colace so that was placed where it needed to be. So far he has done great taking all meds this week.  Will f/u on Monday.   Kerry Hough, EMT-Paramedic  11/28/19

## 2019-12-02 ENCOUNTER — Other Ambulatory Visit (HOSPITAL_COMMUNITY): Payer: Self-pay

## 2019-12-02 ENCOUNTER — Other Ambulatory Visit: Payer: Self-pay | Admitting: Cardiology

## 2019-12-02 NOTE — Progress Notes (Signed)
Paramedicine Encounter    Patient ID: Benjamin [name], male    DOB: 1966/03/20, 54 y.o.   MRN: [id number]   Patient Care Team: [name], NP as PCP - General (Nurse Practitioner) [name], MD as PCP - Cardiology (Cardiology) [name], LCSW as Social Worker (Licensed Clinical Social Worker)  Patient Active Problem List   Diagnosis Date Noted  . Diabetic neuropathy ([place]) 11/20/2019  . Coagulation disorder ([place]) 11/20/2019  . Acute on chronic diastolic CHF (congestive heart failure), NYHA class 3 ([name]) 07/30/2019  . Diabetes mellitus without complication ([place]) 23/76/2831  . Hypertensive emergency   . BRBPR (bright red blood per rectum) 06/12/2019  . Hypertension   . CHF (congestive heart failure) ([place])   . Sleep apnea   . Chronic respiratory failure ([place])   . Hypokalemia 05/29/2019  . GI bleed 05/29/2019  . Anemia 05/28/2019  . Acute on chronic diastolic CHF (congestive heart failure) ([place]) 04/11/2019  . Iron deficiency anemia 03/11/2019  . OSA on CPAP 03/11/2019  . HLD (hyperlipidemia) 03/11/2019  . Elevated troponin 03/11/2019  . GERD (gastroesophageal reflux disease) 03/11/2019  . Rectal bleeding 02/20/2019  . Dyspnea 02/06/2019  . COVID-19 virus infection 02/06/2019  . Bilateral lower extremity edema   . Morbid obesity with BMI of 50.0-59.9, adult ([place])   . Chronic diastolic CHF (congestive heart failure) ([place])   . PAF (paroxysmal atrial fibrillation) ([place])   . PE (pulmonary thromboembolism) ([place]) 01/21/2019  . Hypertensive urgency 01/21/2019  . Diabetes mellitus type 2 in obese ([place]) 01/21/2019    Current Outpatient Medications:  .  albuterol (VENTOLIN HFA) 108 (90 Base) MCG/ACT inhaler, Inhale 2 puffs into the lungs every 6 (six) hours as needed for wheezing or shortness of breath., Disp: 8.5 g, Rfl: 2 .  amLODipine (NORVASC) 10 MG tablet, Take 1 tablet (10 mg total) by mouth daily., Disp: 90 tablet, Rfl: 1 .  atorvastatin (LIPITOR) 20 MG tablet,  Take 1 tablet (20 mg total) by mouth at bedtime., Disp: 90 tablet, Rfl: 2 .  Blood Glucose Monitoring Suppl (TRUE METRIX METER) w/Device KIT, Use as instructed. Check blood glucose level by fingerstick twice per day., Disp: 1 kit, Rfl: 0 .  Blood Pressure Monitor DEVI, Please provide patient with insurance approved blood pressure device with L-XL cuff. BMI 55, Disp: 1 each, Rfl: 0 .  carboxymethylcellulose (REFRESH PLUS) 0.5 % SOLN, Place 1 drop into both eyes daily. , Disp: , Rfl:  .  docusate sodium (COLACE) 100 MG capsule, Take 100 mg by mouth 2 (two) times daily., Disp: , Rfl:  .  ferrous sulfate 325 (65 FE) MG tablet, Take 1 tablet (325 mg total) by mouth daily. Patient will pick up scripts today., Disp: 90 tablet, Rfl: 0 .  glucose blood (TRUE METRIX BLOOD GLUCOSE TEST) test strip, Use as instructed. Check blood glucose level by fingerstick twice per day., Disp: 100 each, Rfl: 12 .  labetalol (NORMODYNE) 200 MG tablet, Take 2 tablets (400 mg total) by mouth 2 (two) times daily., Disp: 120 tablet, Rfl: 3 .  losartan (COZAAR) 100 MG tablet, Take 1 tablet (100 mg total) by mouth daily. (Patient taking differently: Take 100 mg by mouth at bedtime. ), Disp: 30 tablet, Rfl: 5 .  magnesium oxide (MAG-OX) 400 MG tablet, Take 2 tablets (800 mg total) by mouth every morning AND 1 tablet (400 mg total) every evening., Disp: 90 tablet, Rfl: 6 .  metFORMIN (GLUCOPHAGE) 500 MG tablet, Take 1 tablet (500  mg total) by mouth 2 (two) times daily with a meal. Patient will pick up scripts today., Disp: 180 tablet, Rfl: 1 .  metolazone (ZAROXOLYN) 5 MG tablet, Take 1 tablet every Monday, Wednesday, and Friday., Disp: 15 tablet, Rfl: 3 .  Misc. Devices MISC, Requires O2 @ 3L/min continuously via nasal canula and home fill system,., Disp: 1 each, Rfl: 0 .  Misc. Devices MISC, Please provide Mr. Hinners with insurance approved portable O2 concentrator ICD 10 J96.11 Z99.81, Disp: 1 each, Rfl: 0 .  omeprazole (PRILOSEC)  20 MG capsule, TAKE ONE CAPSULE BY MOUTH ONCE DAILY, Disp: 30 capsule, Rfl: 2 .  potassium chloride SA (KLOR-CON) 20 MEQ tablet, Take 60 meq (3 tabs) in the morning, 40 meq (2 tabs) in the afternoon and 40 meq (2 tabs) in the evening.  Take an extra 2 tabs every dose of metolazone, Disp: 360 tablet, Rfl: 3 .  rivaroxaban (XARELTO) 20 MG TABS tablet, Take 1 tablet (20 mg total) by mouth daily with supper., Disp: 60 tablet, Rfl: 6 .  spironolactone (ALDACTONE) 50 MG tablet, Take 1 tablet (50 mg total) by mouth daily., Disp: 30 tablet, Rfl: 3 .  torsemide (DEMADEX) 100 MG tablet, Take 1 tablet (100 mg total) by mouth 2 (two) times daily., Disp: 180 tablet, Rfl: 3 .  TRUEplus Lancets 28G MISC, Use as instructed. Check blood glucose level by fingerstick twice per day., Disp: 200 each, Rfl: 3 .  levalbuterol (XOPENEX HFA) 45 MCG/ACT inhaler, Inhale 2 puffs into the lungs every 4 (four) hours as needed for wheezing. , Disp: , Rfl:  No Known Allergies    Social History   Socioeconomic History  . Marital status: Single    Spouse name: Not on file  . Number of children: Not on file  . Years of education: Not on file  . Highest education level: Not on file  Occupational History  . Not on file  Tobacco Use  . Smoking status: Former Research scientist (life sciences)  . Smokeless tobacco: Never Used  Substance and Sexual Activity  . Alcohol use: Not Currently  . Drug use: Not Currently  . Sexual activity: Not Currently  Other Topics Concern  . Not on file  Social History Narrative  . Not on file   Social Determinants of Health   Financial Resource Strain: Medium Risk  . Difficulty of Paying Living Expenses: Somewhat hard  Food Insecurity: Food Insecurity Present  . Worried About Charity fundraiser in the Last Year: Sometimes true  . Ran Out of Food in the Last Year: Sometimes true  Transportation Needs: No Transportation Needs  . Lack of Transportation (Medical): No  . Lack of Transportation (Non-Medical): No   Physical Activity:   . Days of Exercise per Week:   . Minutes of Exercise per Session:   Stress:   . Feeling of Stress :   Social Connections:   . Frequency of Communication with Friends and Family:   . Frequency of Social Gatherings with Friends and Family:   . Attends Religious Services:   . Active Member of Clubs or Organizations:   . Attends Archivist Meetings:   Marland Kitchen Marital Status:   Intimate Partner Violence:   . Fear of Current or Ex-Partner:   . Emotionally Abused:   Marland Kitchen Physically Abused:   . Sexually Abused:     Physical Exam      Future Appointments  Date Time Provider [place]  12/03/2019  9:30 AM [facility] LAB [facility] None  12/24/2019  9:00 AM [facility] PA/NP [facility] None    BP 140/80   Pulse 86   Temp 99.3 F (37.4 C)   Resp 18   Wt (!) 396 lb (179.6 kg)   SpO2 94%   BMI 55.23 kg/m   Weight yesterday-398 Last visit weight-400 @ clinic   Pt states he is doing good, he denies sob, no c/p, no dizziness. He is using CPAP mostly every night. Sometimes he falls asleep and dont put it on but mostly he uses it.  -went to pharmacy to p/u spiro, he needs labetalol too--has enough through mon/tues  -he only needs losartan in sat bedtime dose  -also needs potassium  Called those in to pharmacy and I will go pick up and bring back.   [name], [place] [facility] Paramedic  12/02/19

## 2019-12-03 ENCOUNTER — Other Ambulatory Visit: Payer: Self-pay

## 2019-12-03 ENCOUNTER — Ambulatory Visit (HOSPITAL_COMMUNITY)
Admission: RE | Admit: 2019-12-03 | Discharge: 2019-12-03 | Disposition: A | Discharge status: Home / Self Care | Payer: Medicaid Other | Source: Ambulatory Visit | Attending: Internal Medicine | Admitting: Internal Medicine

## 2019-12-03 ENCOUNTER — Other Ambulatory Visit (HOSPITAL_COMMUNITY): Payer: Self-pay

## 2019-12-03 DIAGNOSIS — I5032 Chronic diastolic (congestive) heart failure: Secondary | ICD-10-CM | POA: Diagnosis not present

## 2019-12-03 LAB — BASIC METABOLIC PANEL
Anion gap: 11 (ref 5–15)
BUN: 17 mg/dL (ref 6–20)
CO2: 35 mmol/L — ABNORMAL HIGH (ref 22–32)
Calcium: 8.2 mg/dL — ABNORMAL LOW (ref 8.9–10.3)
Chloride: 92 mmol/L — ABNORMAL LOW (ref 98–111)
Creatinine, Ser: 1.48 mg/dL — ABNORMAL HIGH (ref 0.61–1.24)
GFR calc Af Amer: >60 mL/min (ref >60)
GFR calc non Af Amer: 53 mL/min — ABNORMAL LOW (ref >60)
Glucose, Bld: 123 mg/dL — ABNORMAL HIGH (ref 70–99)
Potassium: 3.3 mmol/L — ABNORMAL LOW (ref 3.5–5.1)
Sodium: 138 mmol/L (ref 135–145)

## 2019-12-03 NOTE — Progress Notes (Signed)
[facility] PREP Weekly Session   Patient Details  Name: Benjamin [name] MRN: [id number] Date of Birth: 24-Apr-1966 Age: 54 y.o. PCP: Claiborne Rigg, NP  Vitals:   12/03/19 1624  Weight: (!) 396 lb (179.6 kg)    [facility] Weekly seesion - 12/03/19 1600      Weekly Session   Topic Discussed  Importance of resistance training;Other ways to be active    Minutes exercised this week  240 minutes    Classes attended to date  3      Fun things you did since last meeting: walk Grateful for: being able to walk   [name] 12/03/2019, 4:26 PM

## 2019-12-03 NOTE — Progress Notes (Signed)
Went to p/u his meds from pharmacy, took to pt and placed in pill box.   Kerry Hough, EMT-Paramedic  12/03/19

## 2019-12-04 ENCOUNTER — Telehealth: Payer: Self-pay | Admitting: Licensed Clinical Social Worker
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
## 2019-12-06 ENCOUNTER — Telehealth: Payer: Self-pay

## 2019-12-06 NOTE — Telephone Encounter (Signed)
Noticed missed calls from patient without message left around 4pm today.  Attempted to reach patient but mailbox full.  Patient returned call to say he had miss placed wallet with $1100 and ID.  States was in his black bag he had with him and he moved it to his coat pocket wonders if left in taxi from [facility] yesterday.   Called [facility] out for transports but will look for wallet and contact patient if found.   Call placed to [facility]-- no wallets turned in.   Explained message from [place]. Patient will be contacted directly if wallet found.

## 2019-12-09 ENCOUNTER — Other Ambulatory Visit (HOSPITAL_COMMUNITY): Payer: Self-pay
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
## 2019-12-10 ENCOUNTER — Telehealth: Payer: Self-pay

## 2019-12-10 ENCOUNTER — Telehealth (HOSPITAL_COMMUNITY): Payer: Self-pay | Admitting: Licensed Clinical Social Worker

## 2019-12-10 DIAGNOSIS — J9611 Chronic respiratory failure with hypoxia: Secondary | ICD-10-CM | POA: Diagnosis not present

## 2019-12-10 DIAGNOSIS — I5032 Chronic diastolic (congestive) heart failure: Secondary | ICD-10-CM | POA: Diagnosis not present

## 2019-12-10 NOTE — Telephone Encounter (Signed)
CSW assisted pt in filling out Managed Medicaid enrollment form and confirmed that selected [employer] plan is in network with our clinic as well as with [facility] where pt sees his PCP.  CSW faxed form to [facility] [employer] for review  [name], LCSW Clinical Social Worker [facility] Desk#: [phone] Cell#: [phone]

## 2019-12-10 NOTE — Telephone Encounter (Signed)
Received call from patient. He is unable to make it to class today due to needing to work. He has not found his wallet and needs to make up the loss of money.  He plans on making it to class on Thursday  Call placed to [facility]-cancel transport for today.

## 2019-12-12 ENCOUNTER — Telehealth (HOSPITAL_COMMUNITY): Payer: Self-pay | Admitting: Licensed Clinical Social Worker

## 2019-12-12 ENCOUNTER — Telehealth: Payer: Self-pay

## 2019-12-12 IMAGING — DX PORTABLE CHEST - 1 VIEW
1 series · 1 of 1 positions shown · non-contrast
Comparison: 04/11/2019. CT 02/26/2019

CLINICAL DATA: Chest pain and shortness of breath.

EXAM:
PORTABLE CHEST 1 VIEW

[chest]
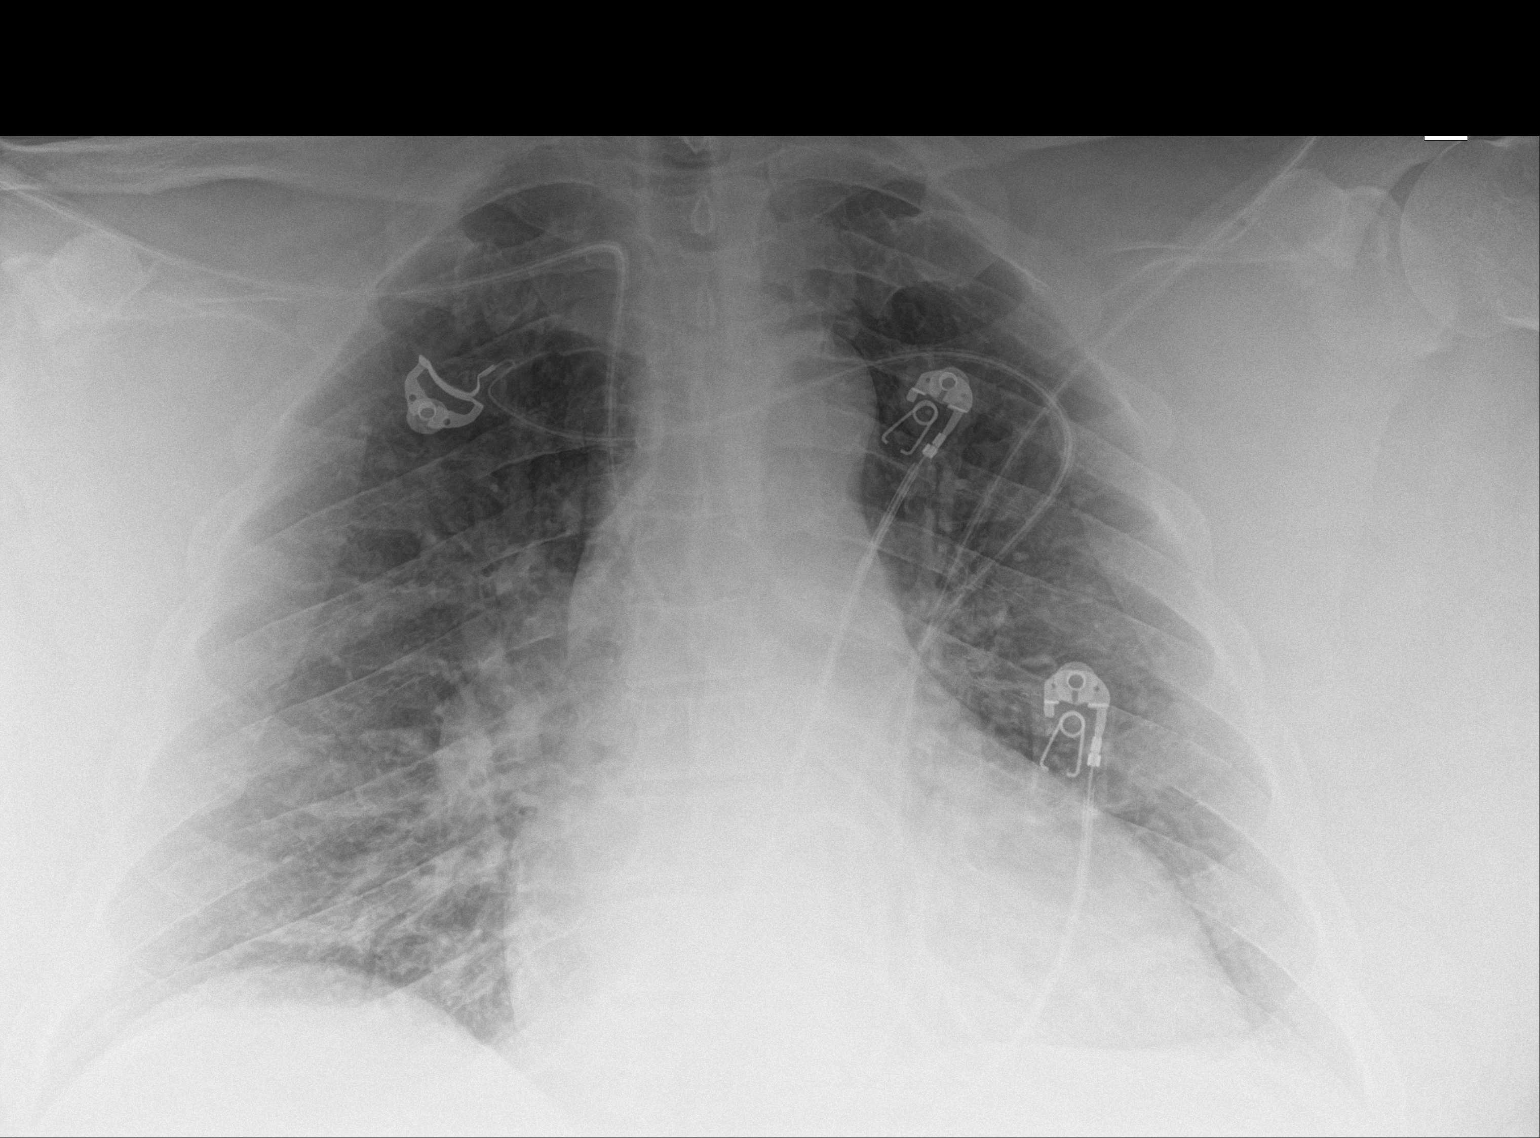

[1 of 1 positions shown; findings below may reference images not displayed]

FINDINGS: Unchanged cardiomegaly. Unchanged mediastinal contours. Prominence
of bronchovascular markings peers similar to prior exam. No evidence
of new airspace disease. No large pleural effusion or pneumothorax.
Detailed evaluation limited by soft tissue attenuation from habitus.
IMPRESSION: Stable cardiomegaly and prominence of bronchovascular markings. No
new abnormality is seen.

## 2019-12-12 NOTE — Telephone Encounter (Signed)
Call from patient needs to cancel coming to class today due to needing to work.  Will be back on next week.  Call placed to [facility]. Cancelled ride for today.
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
## 2019-12-16 ENCOUNTER — Other Ambulatory Visit (HOSPITAL_COMMUNITY): Payer: Self-pay

## 2019-12-16 ENCOUNTER — Other Ambulatory Visit (HOSPITAL_COMMUNITY): Payer: Self-pay | Admitting: *Deleted

## 2019-12-16 MED ORDER — HYDRALAZINE HCL 25 MG PO TABS: 25.0000 mg | ORAL_TABLET | Freq: Three times a day (TID) | ORAL | 3 refills | Status: DC

## 2019-12-16 NOTE — Telephone Encounter (Signed)
Katie called to report patients systolic blood pressure is 190 . Pt has been taking medications as prescribed. Per Brittainy Simmons,PA start hydralazine 25mg  three times daily. Katie aware and will continue to monitor patients bp.

## 2019-12-16 NOTE — Progress Notes (Signed)
Paramedicine Encounter    Patient ID: Benjamin [name], male    DOB: 1965/09/22, 55 y.o.   MRN: [id number]   Patient Care Team: Benjamin Pounds, NP as PCP - General (Nurse Practitioner) Benjamin Records, MD as PCP - Cardiology (Cardiology) Benjamin Ny, LCSW as Social Worker (Licensed Clinical Social Worker)  Patient Active Problem List   Diagnosis Date Noted  . Diabetic neuropathy ([place]) 11/20/2019  . Coagulation disorder ([place]) 11/20/2019  . Acute on chronic diastolic CHF (congestive heart failure), NYHA class 3 ([place]) 07/30/2019  . Diabetes mellitus without complication ([place]) 36/14/4315  . Hypertensive emergency   . BRBPR (bright red blood per rectum) 06/12/2019  . Hypertension   . CHF (congestive heart failure) ([place])   . Sleep apnea   . Chronic respiratory failure ([place])   . Hypokalemia 05/29/2019  . GI bleed 05/29/2019  . Anemia 05/28/2019  . Acute on chronic diastolic CHF (congestive heart failure) ([place]) 04/11/2019  . Iron deficiency anemia 03/11/2019  . OSA on CPAP 03/11/2019  . HLD (hyperlipidemia) 03/11/2019  . Elevated troponin 03/11/2019  . GERD (gastroesophageal reflux disease) 03/11/2019  . Rectal bleeding 02/20/2019  . Dyspnea 02/06/2019  . COVID-19 virus infection 02/06/2019  . Bilateral lower extremity edema   . Morbid obesity with BMI of 50.0-59.9, adult ([place])   . Chronic diastolic CHF (congestive heart failure) ([place])   . PAF (paroxysmal atrial fibrillation) ([place])   . PE (pulmonary thromboembolism) ([name]) 01/21/2019  . Hypertensive urgency 01/21/2019  . Diabetes mellitus type 2 in obese ([place]) 01/21/2019    Current Outpatient Medications:  .  albuterol (VENTOLIN HFA) 108 (90 Base) MCG/ACT inhaler, Inhale 2 puffs into the lungs every 6 (six) hours as needed for wheezing or shortness of breath., Disp: 8.5 g, Rfl: 2 .  amLODipine (NORVASC) 10 MG tablet, Take 1 tablet (10 mg total) by mouth daily., Disp: 90 tablet, Rfl: 1 .  atorvastatin (LIPITOR) 20 MG tablet,  Take 1 tablet (20 mg total) by mouth at bedtime., Disp: 90 tablet, Rfl: 2 .  Blood Glucose Monitoring Suppl (TRUE METRIX METER) w/Device KIT, Use as instructed. Check blood glucose level by fingerstick twice per day., Disp: 1 kit, Rfl: 0 .  Blood Pressure Monitor DEVI, Please provide patient with insurance approved blood pressure device with L-XL cuff. BMI 55, Disp: 1 each, Rfl: 0 .  carboxymethylcellulose (REFRESH PLUS) 0.5 % SOLN, Place 1 drop into both eyes daily. , Disp: , Rfl:  .  docusate sodium (COLACE) 100 MG capsule, Take 100 mg by mouth 2 (two) times daily., Disp: , Rfl:  .  ferrous sulfate 325 (65 FE) MG tablet, Take 1 tablet (325 mg total) by mouth daily. Patient will pick up scripts today., Disp: 90 tablet, Rfl: 0 .  glucose blood (TRUE METRIX BLOOD GLUCOSE TEST) test strip, Use as instructed. Check blood glucose level by fingerstick twice per day., Disp: 100 each, Rfl: 12 .  labetalol (NORMODYNE) 200 MG tablet, Take 2 tablets (400 mg total) by mouth 2 (two) times daily., Disp: 120 tablet, Rfl: 3 .  levalbuterol (XOPENEX HFA) 45 MCG/ACT inhaler, Inhale 2 puffs into the lungs every 4 (four) hours as needed for wheezing. , Disp: , Rfl:  .  losartan (COZAAR) 100 MG tablet, Take 1 tablet (100 mg total) by mouth daily. (Patient taking differently: Take 100 mg by mouth at bedtime. ), Disp: 30 tablet, Rfl: 5 .  magnesium oxide (MAG-OX) 400 MG tablet, Take 2 tablets (800 mg total) by  mouth every morning AND 1 tablet (400 mg total) every evening., Disp: 90 tablet, Rfl: 6 .  metFORMIN (GLUCOPHAGE) 500 MG tablet, Take 1 tablet (500 mg total) by mouth 2 (two) times daily with a meal. Patient will pick up scripts today., Disp: 180 tablet, Rfl: 1 .  metolazone (ZAROXOLYN) 5 MG tablet, Take 1 tablet every Monday, Wednesday, and Friday., Disp: 15 tablet, Rfl: 3 .  Misc. Devices MISC, Requires O2 @ 3L/min continuously via nasal canula and home fill system,., Disp: 1 each, Rfl: 0 .  Misc. Devices MISC,  Please provide Benjamin [name] with insurance approved portable O2 concentrator ICD 10 J96.11 Z99.81, Disp: 1 each, Rfl: 0 .  omeprazole (PRILOSEC) 20 MG capsule, TAKE ONE CAPSULE BY MOUTH ONCE DAILY, Disp: 30 capsule, Rfl: 2 .  potassium chloride SA (KLOR-CON) 20 MEQ tablet, Take 60 meq (3 tabs) in the morning, 40 meq (2 tabs) in the afternoon and 40 meq (2 tabs) in the evening.  Take an extra 2 tabs every dose of metolazone, Disp: 360 tablet, Rfl: 3 .  rivaroxaban (XARELTO) 20 MG TABS tablet, Take 1 tablet (20 mg total) by mouth daily with supper., Disp: 60 tablet, Rfl: 6 .  spironolactone (ALDACTONE) 50 MG tablet, Take 1 tablet (50 mg total) by mouth daily., Disp: 30 tablet, Rfl: 3 .  torsemide (DEMADEX) 100 MG tablet, Take 1 tablet (100 mg total) by mouth 2 (two) times daily., Disp: 180 tablet, Rfl: 3 .  TRUEplus Lancets 28G MISC, Use as instructed. Check blood glucose level by fingerstick twice per day., Disp: 200 each, Rfl: 3 No Known Allergies    Social History   Socioeconomic History  . Marital status: Single    Spouse name: Not on file  . Number of children: Not on file  . Years of education: Not on file  . Highest education level: Not on file  Occupational History  . Not on file  Tobacco Use  . Smoking status: Former Research scientist (life sciences)  . Smokeless tobacco: Never Used  Substance and Sexual Activity  . Alcohol use: Not Currently  . Drug use: Not Currently  . Sexual activity: Not Currently  Other Topics Concern  . Not on file  Social History Narrative  . Not on file   Social Determinants of Health   Financial Resource Strain: Medium Risk  . Difficulty of Paying Living Expenses: Somewhat hard  Food Insecurity: Food Insecurity Present  . Worried About Charity fundraiser in the Last Year: Sometimes true  . Ran Out of Food in the Last Year: Sometimes true  Transportation Needs: No Transportation Needs  . Lack of Transportation (Medical): No  . Lack of Transportation (Non-Medical): No   Physical Activity:   . Days of Exercise per Week:   . Minutes of Exercise per Session:   Stress:   . Feeling of Stress :   Social Connections:   . Frequency of Communication with Friends and Family:   . Frequency of Social Gatherings with Friends and Family:   . Attends Religious Services:   . Active Member of Clubs or Organizations:   . Attends Archivist Meetings:   Marland Kitchen Marital Status:   Intimate Partner Violence:   . Fear of Current or Ex-Partner:   . Emotionally Abused:   Marland Kitchen Physically Abused:   . Sexually Abused:     Physical Exam      Future Appointments  Date Time Provider [place]  12/24/2019  9:00 AM [facility] PA/NP [facility] None  BP (!) 190/0   Pulse 98   Temp 99.3 F (37.4 C)   Resp 18   Wt (!) 396 lb (179.6 kg)   SpO2 94%   BMI 55.23 kg/m   Weight yesterday-407 Last visit weight-404  Pt reports he is doing ok,  He has had more drama take place in this house, one of the roommates had bed bugs and they had to throw out some of the living room furniture. Also the foods she leaves in the fridge is moldy and disgusting. He had called the landlord and he is suppose to be coming here today.  His weight had got up to 407, its back down now. He missed sat afternoon and bed time doses.  He denies increased sob, no dizziness, no c/p. No edema noted.   --needs magnesium and torsemide filled--  meds verified and pill box refilled B/p up-called clinic but no answer  --spoke to jasmine later in the afternoon and she spoke to brittany and she is adding 31m TID hydralazine and that was sent to pharmacy. I will p/u tomor and place in pts pill box.    KMarylouise Stacks EMcCullochCSt Charles - MadrasParamedic  12/16/19

## 2019-12-17 ENCOUNTER — Other Ambulatory Visit (HOSPITAL_COMMUNITY): Payer: Self-pay

## 2019-12-17 NOTE — Progress Notes (Signed)
Went by pharmacy to p/u new med of hydralazine and took to pt to place in pill box.   Kerry Hough, EMT-Paramedic  12/17/19

## 2019-12-18 NOTE — Progress Notes (Signed)
[facility] [employer] PREP Weekly Session   Patient Details  Name: Benjamin [name] MRN: [id number] Date of Birth: [birth year] Age: 54 y.o. PCP: Claiborne Rigg, NP  Vitals:   12/17/19 1430  Weight: (!) 401 lb (181.9 kg)    [facility] Weekly seesion - 12/18/19 1100      Weekly Session   Topic Discussed  Health habits   salt demo   Minutes exercised this week  0 minutes    Classes attended to date  6      Fun things: walk Grateful for: being here today  [name] 12/18/2019, 11:01 AM

## 2019-12-19 ENCOUNTER — Telehealth (HOSPITAL_COMMUNITY): Payer: Self-pay | Admitting: *Deleted

## 2019-12-19 ENCOUNTER — Other Ambulatory Visit (HOSPITAL_COMMUNITY): Payer: Self-pay | Admitting: *Deleted

## 2019-12-19 ENCOUNTER — Ambulatory Visit (HOSPITAL_COMMUNITY)
Admission: RE | Admit: 2019-12-19 | Discharge: 2019-12-19 | Disposition: A | Discharge status: Home / Self Care | Payer: Medicaid Other | Source: Ambulatory Visit | Attending: Internal Medicine | Admitting: Internal Medicine

## 2019-12-19 ENCOUNTER — Telehealth: Payer: Self-pay

## 2019-12-19 ENCOUNTER — Other Ambulatory Visit: Payer: Self-pay

## 2019-12-19 ENCOUNTER — Other Ambulatory Visit (HOSPITAL_COMMUNITY): Payer: Medicaid Other

## 2019-12-19 DIAGNOSIS — I5032 Chronic diastolic (congestive) heart failure: Secondary | ICD-10-CM | POA: Diagnosis not present

## 2019-12-19 DIAGNOSIS — J9611 Chronic respiratory failure with hypoxia: Secondary | ICD-10-CM | POA: Diagnosis not present

## 2019-12-19 LAB — BASIC METABOLIC PANEL
Anion gap: 13 (ref 5–15)
BUN: 16 mg/dL (ref 6–20)
CO2: 34 mmol/L — ABNORMAL HIGH (ref 22–32)
Calcium: 9.3 mg/dL (ref 8.9–10.3)
Chloride: 90 mmol/L — ABNORMAL LOW (ref 98–111)
Creatinine, Ser: 1.64 mg/dL — ABNORMAL HIGH (ref 0.61–1.24)
GFR calc Af Amer: 54 mL/min — ABNORMAL LOW (ref >60)
GFR calc non Af Amer: 47 mL/min — ABNORMAL LOW (ref >60)
Glucose, Bld: 113 mg/dL — ABNORMAL HIGH (ref 70–99)
Potassium: 3.2 mmol/L — ABNORMAL LOW (ref 3.5–5.1)
Sodium: 137 mmol/L (ref 135–145)

## 2019-12-19 NOTE — Telephone Encounter (Signed)
Call from patient reference not being able to make it today to PREP class. States cramping in side and legs. States happens often. Is on diuretics and reports to be taking his potassium as directed. Advised that if things don't get better to notify his doctor. Patient said he would.   Call placed to [facility] transport-cancelled transport to [facility] today.

## 2019-12-19 NOTE — Telephone Encounter (Signed)
Benjamin [name] called to report pt is experiencing bad cramps. Pt doubled his diuretic yesterday and has recently admitted to not taking his potassium as ordered. Last potassium 2 weeks ago was 3.3. Patient is prescribed Kdur 60 meq (3 tabs) in the morning, 40 meq (2 tabs) in the afternoon and 40 meq (2 tabs) in the evening. Take an extra 2 tabs every dose of metolazone. Patient scheduled for STAT bmet today and will wait for results.

## 2019-12-20 ENCOUNTER — Telehealth (HOSPITAL_COMMUNITY): Payer: Self-pay | Admitting: Licensed Clinical Social Worker

## 2019-12-20 NOTE — Telephone Encounter (Signed)
CSW called to check in with patient after he was having bad cramps yesterday following accidentally taking too many medications.  Pt reports he is feeling much better today.  CSW also following up regarding pt interest in continuing his GED- CSW provided him with number for the [facility] Adult Education program where they offer free GED program so he can call and discuss enrollment.   Pt also inquired about diabetic shoes which CHW was attempting to get him through his Medicaid- CSW sent message to RN Case Manager at [facility] to inquire.  CSW will continue to follow and assist as needed  [name], LCSW Clinical Social Worker [facility] Desk#: [phone] Cell#: [phone]

## 2019-12-23 ENCOUNTER — Telehealth (HOSPITAL_COMMUNITY): Payer: Self-pay | Admitting: *Deleted

## 2019-12-23 ENCOUNTER — Other Ambulatory Visit (HOSPITAL_COMMUNITY): Payer: Self-pay

## 2019-12-23 DIAGNOSIS — I10 Essential (primary) hypertension: Secondary | ICD-10-CM

## 2019-12-23 DIAGNOSIS — E876 Hypokalemia: Secondary | ICD-10-CM

## 2019-12-23 MED ORDER — POTASSIUM CHLORIDE CRYS ER 20 MEQ PO TBCR: EXTENDED_RELEASE_TABLET | ORAL | 3 refills | Status: DC

## 2019-12-23 NOTE — Telephone Encounter (Signed)
Spoke w/Katie, she is aware of med change, she states pt does take his meds, she will see him today and increase KCL, med list updated    Noralee Space, RN  12/20/2019 3:13 PM EDT    Spoke w/pt, he is aware and states he takes the KCL pills. He is unable to adjust pill box as Psychiatric nurse, paramedic, does this for him, he ask that I have her come out and fix it. Advised [name] is off today, will have her come out on Monday, until then make sure to take all meds in pill box, he is agreeable.   Robbie Lis M, PA-C  12/19/2019 5:57 PM EDT    K low at 3.2. check compliance w/ spironolactone and KCl.  He should increase KCl to 60 meq qam, 60 meq mid day and continue 40 meq at night.   Repeat BMP in 1 week

## 2019-12-23 NOTE — Progress Notes (Signed)
Paramedicine Encounter    Patient ID: Benjamin [name], male    DOB: 1966/05/31, 54 y.o.   MRN: [id number]   Patient Care Team: [name], NP as PCP - General (Nurse Practitioner) [name], MD as PCP - Cardiology (Cardiology) [name], LCSW as Social Worker (Licensed Clinical Social Worker)  Patient Active Problem List   Diagnosis Date Noted  . Diabetic neuropathy ([place]) 11/20/2019  . Coagulation disorder ([place]) 11/20/2019  . Acute on chronic diastolic CHF (congestive heart failure), NYHA class 3 ([place]) 07/30/2019  . Diabetes mellitus without complication ([place]) 47/05/6282  . Hypertensive emergency   . BRBPR (bright red blood per rectum) 06/12/2019  . Hypertension   . CHF (congestive heart failure) ([place])   . Sleep apnea   . Chronic respiratory failure ([place])   . Hypokalemia 05/29/2019  . GI bleed 05/29/2019  . Anemia 05/28/2019  . Acute on chronic diastolic CHF (congestive heart failure) ([place]) 04/11/2019  . Iron deficiency anemia 03/11/2019  . OSA on CPAP 03/11/2019  . HLD (hyperlipidemia) 03/11/2019  . Elevated troponin 03/11/2019  . GERD (gastroesophageal reflux disease) 03/11/2019  . Rectal bleeding 02/20/2019  . Dyspnea 02/06/2019  . COVID-19 virus infection 02/06/2019  . Bilateral lower extremity edema   . Morbid obesity with BMI of 50.0-59.9, adult ([place])   . Chronic diastolic CHF (congestive heart failure) ([place])   . PAF (paroxysmal atrial fibrillation) ([place])   . PE (pulmonary thromboembolism) ([place]) 01/21/2019  . Hypertensive urgency 01/21/2019  . Diabetes mellitus type 2 in obese ([place]) 01/21/2019    Current Outpatient Medications:  .  albuterol (VENTOLIN HFA) 108 (90 Base) MCG/ACT inhaler, Inhale 2 puffs into the lungs every 6 (six) hours as needed for wheezing or shortness of breath., Disp: 8.5 g, Rfl: 2 .  amLODipine (NORVASC) 10 MG tablet, Take 1 tablet (10 mg total) by mouth daily., Disp: 90 tablet, Rfl: 1 .  atorvastatin (LIPITOR) 20 MG tablet,  Take 1 tablet (20 mg total) by mouth at bedtime., Disp: 90 tablet, Rfl: 2 .  Blood Glucose Monitoring Suppl (TRUE METRIX METER) w/Device KIT, Use as instructed. Check blood glucose level by fingerstick twice per day., Disp: 1 kit, Rfl: 0 .  Blood Pressure Monitor DEVI, Please provide patient with insurance approved blood pressure device with L-XL cuff. BMI 55, Disp: 1 each, Rfl: 0 .  carboxymethylcellulose (REFRESH PLUS) 0.5 % SOLN, Place 1 drop into both eyes daily. , Disp: , Rfl:  .  docusate sodium (COLACE) 100 MG capsule, Take 100 mg by mouth 2 (two) times daily., Disp: , Rfl:  .  ferrous sulfate 325 (65 FE) MG tablet, Take 1 tablet (325 mg total) by mouth daily. Patient will pick up scripts today., Disp: 90 tablet, Rfl: 0 .  glucose blood (TRUE METRIX BLOOD GLUCOSE TEST) test strip, Use as instructed. Check blood glucose level by fingerstick twice per day., Disp: 100 each, Rfl: 12 .  hydrALAZINE (APRESOLINE) 25 MG tablet, Take 1 tablet (25 mg total) by mouth 3 (three) times daily., Disp: 270 tablet, Rfl: 3 .  labetalol (NORMODYNE) 200 MG tablet, Take 2 tablets (400 mg total) by mouth 2 (two) times daily., Disp: 120 tablet, Rfl: 3 .  losartan (COZAAR) 100 MG tablet, Take 1 tablet (100 mg total) by mouth daily. (Patient taking differently: Take 100 mg by mouth at bedtime. ), Disp: 30 tablet, Rfl: 5 .  magnesium oxide (MAG-OX) 400 MG tablet, Take 2 tablets (800 mg total) by mouth every morning  AND 1 tablet (400 mg total) every evening., Disp: 90 tablet, Rfl: 6 .  metFORMIN (GLUCOPHAGE) 500 MG tablet, Take 1 tablet (500 mg total) by mouth 2 (two) times daily with a meal. Patient will pick up scripts today., Disp: 180 tablet, Rfl: 1 .  metolazone (ZAROXOLYN) 5 MG tablet, Take 1 tablet every Monday, Wednesday, and Friday., Disp: 15 tablet, Rfl: 3 .  Misc. Devices MISC, Requires O2 @ 3L/min continuously via nasal canula and home fill system,., Disp: 1 each, Rfl: 0 .  Misc. Devices MISC, Please provide  Mr. Deakins with insurance approved portable O2 concentrator ICD 10 J96.11 Z99.81, Disp: 1 each, Rfl: 0 .  omeprazole (PRILOSEC) 20 MG capsule, TAKE ONE CAPSULE BY MOUTH ONCE DAILY, Disp: 30 capsule, Rfl: 2 .  potassium chloride SA (KLOR-CON) 20 MEQ tablet, Take 60 meq (3 tabs) in the morning, 60 meq (3 tabs) in the afternoon and 40 meq (2 tabs) in the evening.  Take an extra 2 tabs every dose of metolazone, Disp: 360 tablet, Rfl: 3 .  rivaroxaban (XARELTO) 20 MG TABS tablet, Take 1 tablet (20 mg total) by mouth daily with supper., Disp: 60 tablet, Rfl: 6 .  spironolactone (ALDACTONE) 50 MG tablet, Take 1 tablet (50 mg total) by mouth daily., Disp: 30 tablet, Rfl: 3 .  torsemide (DEMADEX) 100 MG tablet, Take 1 tablet (100 mg total) by mouth 2 (two) times daily., Disp: 180 tablet, Rfl: 3 .  TRUEplus Lancets 28G MISC, Use as instructed. Check blood glucose level by fingerstick twice per day., Disp: 200 each, Rfl: 3 .  levalbuterol (XOPENEX HFA) 45 MCG/ACT inhaler, Inhale 2 puffs into the lungs every 4 (four) hours as needed for wheezing. , Disp: , Rfl:  No Known Allergies    Social History   Socioeconomic History  . Marital status: Single    Spouse name: Not on file  . Number of children: Not on file  . Years of education: Not on file  . Highest education level: Not on file  Occupational History  . Not on file  Tobacco Use  . Smoking status: Former Research scientist (life sciences)  . Smokeless tobacco: Never Used  Substance and Sexual Activity  . Alcohol use: Not Currently  . Drug use: Not Currently  . Sexual activity: Not Currently  Other Topics Concern  . Not on file  Social History Narrative  . Not on file   Social Determinants of Health   Financial Resource Strain: Medium Risk  . Difficulty of Paying Living Expenses: Somewhat hard  Food Insecurity: Food Insecurity Present  . Worried About Charity fundraiser in the Last Year: Sometimes true  . Ran Out of Food in the Last Year: Sometimes true   Transportation Needs: No Transportation Needs  . Lack of Transportation (Medical): No  . Lack of Transportation (Non-Medical): No  Physical Activity:   . Days of Exercise per Week:   . Minutes of Exercise per Session:   Stress:   . Feeling of Stress :   Social Connections:   . Frequency of Communication with Friends and Family:   . Frequency of Social Gatherings with Friends and Family:   . Attends Religious Services:   . Active Member of Clubs or Organizations:   . Attends Archivist Meetings:   Marland Kitchen Marital Status:   Intimate Partner Violence:   . Fear of Current or Ex-Partner:   . Emotionally Abused:   Marland Kitchen Physically Abused:   . Sexually Abused:  Physical Exam      Future Appointments  Date Time Provider [place]  12/24/2019  9:00 AM [facility] PA/NP [facility] None    BP (!) 164/0   Pulse 90   Temp 98.2 F (36.8 C)   Resp 20   SpO2 92%   I arrived for home visit this morning, pt was not answering the phone nor coming to door and I didn't see his window/blinds open. He finally answered after about 5 times of trying to call him and that time he wasn't even answering me.  His roommate opened to door for me and I walked back to his room and he was lying in bed wearing his CPAP and about half asleep.  He states since Thursday--the day he was cramping so bad he also started n/v/d. Has not been able to keep his meds down. He states they make him sick. He stated hydralazine on Tuesday.  The n/v/d started Thursday after he got back from having his labs taken at clinic. He denies being around anyone he knew was sick. I told him it could be very serious but he still didn't want to go to hosp. He is getting his roommate to go get him some pepto. I told him if that didn't make him feel better then he needed to go to ER, he agreed.  He is also having abd pains. I told him to go to ER and I would call EMS unit there to take him but he adamantly refused and said he couldn't  lay there for hours in the hallway with his diarrhea and vomiting.  He denies any blood in stool or vomit.  He denies c/p.  I did fill his pill box back up.  I told him to call me later this afternoon.   Needs amlodipine, spiro, ator, xarelto, metolazone, omeprazole,   Labetalol, losartan if they can   [name], [place] Paramedic  12/23/19

## 2019-12-24 ENCOUNTER — Telehealth (HOSPITAL_COMMUNITY): Payer: Self-pay | Admitting: *Deleted

## 2019-12-24 ENCOUNTER — Telehealth (HOSPITAL_COMMUNITY): Payer: Self-pay | Admitting: Licensed Clinical Social Worker

## 2019-12-24 ENCOUNTER — Telehealth: Payer: Self-pay

## 2019-12-24 ENCOUNTER — Other Ambulatory Visit: Payer: Self-pay

## 2019-12-24 ENCOUNTER — Ambulatory Visit (HOSPITAL_COMMUNITY)
Admission: RE | Admit: 2019-12-24 | Discharge: 2019-12-24 | Disposition: A | Discharge status: Home / Self Care | Payer: Medicaid Other | Source: Ambulatory Visit | Attending: Cardiology | Admitting: Cardiology

## 2019-12-24 ENCOUNTER — Encounter (HOSPITAL_COMMUNITY): Payer: Medicaid Other

## 2019-12-24 DIAGNOSIS — I5032 Chronic diastolic (congestive) heart failure: Secondary | ICD-10-CM

## 2019-12-24 DIAGNOSIS — I480 Paroxysmal atrial fibrillation: Secondary | ICD-10-CM | POA: Insufficient documentation

## 2019-12-24 LAB — BASIC METABOLIC PANEL
Anion gap: 12 (ref 5–15)
BUN: 33 mg/dL — ABNORMAL HIGH (ref 6–20)
CO2: 34 mmol/L — ABNORMAL HIGH (ref 22–32)
Calcium: 8.9 mg/dL (ref 8.9–10.3)
Chloride: 88 mmol/L — ABNORMAL LOW (ref 98–111)
Creatinine, Ser: 1.72 mg/dL — ABNORMAL HIGH (ref 0.61–1.24)
GFR calc Af Amer: 51 mL/min — ABNORMAL LOW (ref >60)
GFR calc non Af Amer: 44 mL/min — ABNORMAL LOW (ref >60)
Glucose, Bld: 98 mg/dL (ref 70–99)
Potassium: 3.2 mmol/L — ABNORMAL LOW (ref 3.5–5.1)
Sodium: 134 mmol/L — ABNORMAL LOW (ref 135–145)

## 2019-12-24 NOTE — Telephone Encounter (Signed)
Pt reached out to [place], CSW to resch his appt today due to nausea/vomiting. He reports that he feels it is related to his meds, specifically his KCL and states for several weeks his stomach has been upset (uncomfortable/nausea/vomiting at times) after he takes his meds.  Per Posey Boyer D possible KCL could be causing these symptoms as he is on a higher dose, she questions if we could increase his spiro and decrease KCL tabs.  Per Tonye Becket, NP pt needs bmet & cbc before she is comfortable to make changes. Pt will come for labs today at 2 pm.

## 2019-12-24 NOTE — Telephone Encounter (Signed)
CSW informed by [employer] that pt not feeling well and has been sick for several days now with stomach upset.  Pt concerned his medications are causing him to feel bad as he reports his stomach has been upset after taking his meds for awhile now.  Pt also reports to CSW that he had blood in his stool yesterday.  CSW had clinic pharmacist look at list and they confirm that pt level of potassium he is on is likely causing stomach upset but pt potassium has been low recently so unable to decrease potassium without adjusting some other medications.   CSW discussed with APP who would like pt to come for labs this afternoon to get BMET and hemoglobin test to see if pt might have another GI bleed.  Pt informed of appt and CSW requested cab to get him here.  CSW will continue to follow and assist as needed  [name], LCSW Clinical Social Worker [facility] Desk#: [phone] Cell#: [phone]

## 2019-12-24 NOTE — Telephone Encounter (Signed)
Message from patient today stating he would not be coming to [facility] today. Still not feeling well. Has a couple of MD appts to check things out.   Called pt, got message. Will cancel transport. Hopes he can come on Thursday.   Call to [facility] and canceled todays transport.

## 2019-12-25 ENCOUNTER — Telehealth (HOSPITAL_COMMUNITY): Payer: Self-pay | Admitting: Licensed Clinical Social Worker

## 2019-12-25 ENCOUNTER — Telehealth: Payer: Self-pay

## 2019-12-25 NOTE — Telephone Encounter (Signed)
Call received from [facility],LCSW/HF clinic.  She said that the patient has reported occasional blood with his stool. No blood with his stool yesterday.  He has also been complaining of abdominal pain. She called GI and was told that despite his symptoms he could not be seen until May 2021.  She is requesting advise from PCP.

## 2019-12-25 NOTE — Telephone Encounter (Signed)
CSW informed that pt was unable to have hemoglobin levels tested because there wasn't enough blood in the vial.  CSW trying to get pt into [facility] or to PCP to follow up on recent rectal bleeding and stomach aches.  CSW called [facility] but was told no appts until May even with pt current bleeding concerns- recommend getting pt PCP appt.  CSW called [facility]W and requested appt asap- awaiting to hear if they can work him in.  CSW will continue to follow and assist as needed  [name], LCSW Clinical Social Worker [facility] Desk#: [phone] Cell#: [phone]

## 2019-12-26 ENCOUNTER — Telehealth: Payer: Self-pay

## 2019-12-26 NOTE — Telephone Encounter (Signed)
CBC cancelled by lab as they did not have enough blood in tube to run test. BMET results sent to Tonye Becket, NP for review and further recommendations

## 2019-12-26 NOTE — Telephone Encounter (Signed)
Feeling better but not ready to return to gym. Will return next Tuesday.  Call placed to [facility]. Cancelled scheduled ride for today.

## 2019-12-27 NOTE — Telephone Encounter (Signed)
Please set up CBC   Arick Mareno NP-C  3:53 PM

## 2019-12-27 NOTE — Telephone Encounter (Signed)
Labs 12/30/19 at 1130 Pt aware and voiced understanding

## 2019-12-28 NOTE — Telephone Encounter (Signed)
Please schedule him for televisit. If he is experiencing abdominal pain and bleeding returns prior to appointment then he needs to be seen in the emergency room.

## 2019-12-30 ENCOUNTER — Other Ambulatory Visit (HOSPITAL_COMMUNITY): Payer: Self-pay | Admitting: Adult Health

## 2019-12-30 ENCOUNTER — Ambulatory Visit (HOSPITAL_COMMUNITY)
Admission: RE | Admit: 2019-12-30 | Discharge: 2019-12-30 | Disposition: A | Discharge status: Home / Self Care | Payer: Medicaid Other | Source: Ambulatory Visit | Attending: Cardiology | Admitting: Cardiology

## 2019-12-30 ENCOUNTER — Other Ambulatory Visit: Payer: Self-pay

## 2019-12-30 ENCOUNTER — Telehealth (HOSPITAL_COMMUNITY): Payer: Self-pay | Admitting: Licensed Clinical Social Worker

## 2019-12-30 ENCOUNTER — Other Ambulatory Visit (HOSPITAL_COMMUNITY): Payer: Self-pay

## 2019-12-30 DIAGNOSIS — I5032 Chronic diastolic (congestive) heart failure: Secondary | ICD-10-CM | POA: Insufficient documentation

## 2019-12-30 DIAGNOSIS — I509 Heart failure, unspecified: Secondary | ICD-10-CM

## 2019-12-30 LAB — BASIC METABOLIC PANEL
Anion gap: 9 (ref 5–15)
BUN: 15 mg/dL (ref 6–20)
CO2: 33 mmol/L — ABNORMAL HIGH (ref 22–32)
Calcium: 8.4 mg/dL — ABNORMAL LOW (ref 8.9–10.3)
Chloride: 95 mmol/L — ABNORMAL LOW (ref 98–111)
Creatinine, Ser: 1.14 mg/dL (ref 0.61–1.24)
GFR calc Af Amer: >60 mL/min (ref >60)
GFR calc non Af Amer: >60 mL/min (ref >60)
Glucose, Bld: 151 mg/dL — ABNORMAL HIGH (ref 70–99)
Potassium: 3.5 mmol/L (ref 3.5–5.1)
Sodium: 137 mmol/L (ref 135–145)

## 2019-12-30 LAB — CBC
HCT: 45.3 % (ref 39.0–52.0)
Hemoglobin: 13 g/dL (ref 13.0–17.0)
MCH: 22.9 pg — ABNORMAL LOW (ref 26.0–34.0)
MCHC: 28.7 g/dL — ABNORMAL LOW (ref 30.0–36.0)
MCV: 79.9 fL — ABNORMAL LOW (ref 80.0–100.0)
Platelets: 360 10*3/uL (ref 150–400)
RBC: 5.67 MIL/uL (ref 4.22–5.81)
RDW: 18.3 % — ABNORMAL HIGH (ref 11.5–15.5)
WBC: 7.5 10*3/uL (ref 4.0–10.5)
nRBC: 0 % (ref 0.0–0.2)

## 2019-12-30 NOTE — Progress Notes (Signed)
Paramedicine Encounter    Patient ID: Benjamin [name], male    DOB: 1966/03/20, 54 y.o.   MRN: [id number]   Patient Care Team: [name], NP as PCP - General (Nurse Practitioner) [name], MD as PCP - Cardiology (Cardiology) [name], LCSW as Social Worker (Licensed Clinical Social Worker)  Patient Active Problem List   Diagnosis Date Noted  . Diabetic neuropathy ([place]) 11/20/2019  . Coagulation disorder ([place]) 11/20/2019  . Acute on chronic diastolic CHF (congestive heart failure), NYHA class 3 ([name]) 07/30/2019  . Diabetes mellitus without complication ([place]) 23/76/2831  . Hypertensive emergency   . BRBPR (bright red blood per rectum) 06/12/2019  . Hypertension   . CHF (congestive heart failure) ([place])   . Sleep apnea   . Chronic respiratory failure ([place])   . Hypokalemia 05/29/2019  . GI bleed 05/29/2019  . Anemia 05/28/2019  . Acute on chronic diastolic CHF (congestive heart failure) ([place]) 04/11/2019  . Iron deficiency anemia 03/11/2019  . OSA on CPAP 03/11/2019  . HLD (hyperlipidemia) 03/11/2019  . Elevated troponin 03/11/2019  . GERD (gastroesophageal reflux disease) 03/11/2019  . Rectal bleeding 02/20/2019  . Dyspnea 02/06/2019  . COVID-19 virus infection 02/06/2019  . Bilateral lower extremity edema   . Morbid obesity with BMI of 50.0-59.9, adult ([place])   . Chronic diastolic CHF (congestive heart failure) ([place])   . PAF (paroxysmal atrial fibrillation) ([place])   . PE (pulmonary thromboembolism) ([place]) 01/21/2019  . Hypertensive urgency 01/21/2019  . Diabetes mellitus type 2 in obese ([place]) 01/21/2019    Current Outpatient Medications:  .  albuterol (VENTOLIN HFA) 108 (90 Base) MCG/ACT inhaler, Inhale 2 puffs into the lungs every 6 (six) hours as needed for wheezing or shortness of breath., Disp: 8.5 g, Rfl: 2 .  amLODipine (NORVASC) 10 MG tablet, Take 1 tablet (10 mg total) by mouth daily., Disp: 90 tablet, Rfl: 1 .  atorvastatin (LIPITOR) 20 MG tablet,  Take 1 tablet (20 mg total) by mouth at bedtime., Disp: 90 tablet, Rfl: 2 .  Blood Glucose Monitoring Suppl (TRUE METRIX METER) w/Device KIT, Use as instructed. Check blood glucose level by fingerstick twice per day., Disp: 1 kit, Rfl: 0 .  Blood Pressure Monitor DEVI, Please provide patient with insurance approved blood pressure device with L-XL cuff. BMI 55, Disp: 1 each, Rfl: 0 .  carboxymethylcellulose (REFRESH PLUS) 0.5 % SOLN, Place 1 drop into both eyes daily. , Disp: , Rfl:  .  docusate sodium (COLACE) 100 MG capsule, Take 100 mg by mouth 2 (two) times daily., Disp: , Rfl:  .  ferrous sulfate 325 (65 FE) MG tablet, Take 1 tablet (325 mg total) by mouth daily. Patient will pick up scripts today., Disp: 90 tablet, Rfl: 0 .  glucose blood (TRUE METRIX BLOOD GLUCOSE TEST) test strip, Use as instructed. Check blood glucose level by fingerstick twice per day., Disp: 100 each, Rfl: 12 .  labetalol (NORMODYNE) 200 MG tablet, Take 2 tablets (400 mg total) by mouth 2 (two) times daily., Disp: 120 tablet, Rfl: 3 .  losartan (COZAAR) 100 MG tablet, Take 1 tablet (100 mg total) by mouth daily. (Patient taking differently: Take 100 mg by mouth at bedtime. ), Disp: 30 tablet, Rfl: 5 .  magnesium oxide (MAG-OX) 400 MG tablet, Take 2 tablets (800 mg total) by mouth every morning AND 1 tablet (400 mg total) every evening., Disp: 90 tablet, Rfl: 6 .  metFORMIN (GLUCOPHAGE) 500 MG tablet, Take 1 tablet (500  mg total) by mouth 2 (two) times daily with a meal. Patient will pick up scripts today., Disp: 180 tablet, Rfl: 1 .  metolazone (ZAROXOLYN) 5 MG tablet, TAKE 1 TABLET EVERY MONDAY, WEDNESDAY, AND FRIDAY., Disp: 15 tablet, Rfl: 3 .  Misc. Devices MISC, Requires O2 @ 3L/min continuously via nasal canula and home fill system,., Disp: 1 each, Rfl: 0 .  Misc. Devices MISC, Please provide Mr. Sultana with insurance approved portable O2 concentrator ICD 10 J96.11 Z99.81, Disp: 1 each, Rfl: 0 .  omeprazole (PRILOSEC)  20 MG capsule, TAKE ONE CAPSULE BY MOUTH ONCE DAILY, Disp: 30 capsule, Rfl: 2 .  potassium chloride SA (KLOR-CON) 20 MEQ tablet, Take 60 meq (3 tabs) in the morning, 60 meq (3 tabs) in the afternoon and 40 meq (2 tabs) in the evening.  Take an extra 2 tabs every dose of metolazone, Disp: 360 tablet, Rfl: 3 .  rivaroxaban (XARELTO) 20 MG TABS tablet, Take 1 tablet (20 mg total) by mouth daily with supper., Disp: 60 tablet, Rfl: 6 .  spironolactone (ALDACTONE) 50 MG tablet, Take 1 tablet (50 mg total) by mouth daily., Disp: 30 tablet, Rfl: 3 .  torsemide (DEMADEX) 100 MG tablet, Take 1 tablet (100 mg total) by mouth 2 (two) times daily., Disp: 180 tablet, Rfl: 3 .  TRUEplus Lancets 28G MISC, Use as instructed. Check blood glucose level by fingerstick twice per day., Disp: 200 each, Rfl: 3 .  hydrALAZINE (APRESOLINE) 25 MG tablet, Take 1 tablet (25 mg total) by mouth 3 (three) times daily., Disp: 270 tablet, Rfl: 3 .  levalbuterol (XOPENEX HFA) 45 MCG/ACT inhaler, Inhale 2 puffs into the lungs every 4 (four) hours as needed for wheezing. , Disp: , Rfl:  No Known Allergies    Social History   Socioeconomic History  . Marital status: Single    Spouse name: Not on file  . Number of children: Not on file  . Years of education: Not on file  . Highest education level: Not on file  Occupational History  . Not on file  Tobacco Use  . Smoking status: Former Research scientist (life sciences)  . Smokeless tobacco: Never Used  Substance and Sexual Activity  . Alcohol use: Not Currently  . Drug use: Not Currently  . Sexual activity: Not Currently  Other Topics Concern  . Not on file  Social History Narrative  . Not on file   Social Determinants of Health   Financial Resource Strain: Medium Risk  . Difficulty of Paying Living Expenses: Somewhat hard  Food Insecurity: Food Insecurity Present  . Worried About Charity fundraiser in the Last Year: Sometimes true  . Ran Out of Food in the Last Year: Sometimes true   Transportation Needs: No Transportation Needs  . Lack of Transportation (Medical): No  . Lack of Transportation (Non-Medical): No  Physical Activity:   . Days of Exercise per Week:   . Minutes of Exercise per Session:   Stress:   . Feeling of Stress :   Social Connections:   . Frequency of Communication with Friends and Family:   . Frequency of Social Gatherings with Friends and Family:   . Attends Religious Services:   . Active Member of Clubs or Organizations:   . Attends Archivist Meetings:   Marland Kitchen Marital Status:   Intimate Partner Violence:   . Fear of Current or Ex-Partner:   . Emotionally Abused:   Marland Kitchen Physically Abused:   . Sexually Abused:  Physical Exam      Future Appointments  Date Time Provider [place]  01/01/2020 10:50 AM [name], NP [facility] None  01/02/2020  9:30 AM [facility]  01/13/2020 12:00 PM [facility] PA/NP [facility] None    BP (!) 168/0   Pulse 94   Temp 98.2 F (36.8 C)   Resp 20   Wt (!) 383 lb (173.7 kg)   SpO2 94%   BMI 53.42 kg/m   Weight yesterday-383 Last visit weight-396  Pt states his stomach feels better-last bloody stool was 3 days ago. Before that he said it wasn't that bad and happened occasionally. No more n/v/d. But he has not been taking all of his meds b/c when he does it causes him a lot of abd pain.  He wants to take his meds, he knows they help him stay out of the hosp and help with his CHF but he doesn't want to keep feeling bad. He states he feels worse taking them then he does not taking them.   He wants a "reset" of his meds. He is adamant that something is not mixing right together, taking too much of something and just doesn't feel comfortable with the regimen he is on now.  He said hes been on metformin for a long time and it hasnt ever bothered him before. (metformin can cause stomach upset)   When his potassium was increased the hydralazine was added at pretty  close to same time--he thinks its b/c of the increased potassium. He hasnt been taking a lot of that. hes been picking through his meds and taking some things.  Today I went through every pill he takes and showed him what he looks like and why he is on it so if he continues to pick through his meds at least he has better idea what he is/is not taking.  He is adamant about not taking all his meds.  My advice to him was to stop the most recent thing that was added b/c he didn't c/o of the n/v/d and abd pains on his previous regimen so I took out the hydralazine to make small changes first. He does have bad taste of all the potassium tablets he has to take. So I told him to take as many as the potassium he could that was in each of his pill boxes on the appropriate day so hes at least getting some of it in when he takes the torsemides.  His weight is way down. He is doing better with managing his fluid.  Ideas- ???   I hope to get him in clinic at least for a pharmacy visit to review his meds and maybe see if his metolazone can be cut back since he is managing his fluid better now and maybe cut back on his potassium too--see if that is a possible option. Will bring that up at visit if we can get him soon.     [name], [place] Paramedic  12/30/19

## 2019-12-30 NOTE — Telephone Encounter (Signed)
Schedule patient an Televisit with PCP. Pt. Is aware of date and time.  Pt was informed on PCP advising to do to E/D if he have any abdominal pain or bleeding. Pt. Understood.

## 2019-12-30 NOTE — Telephone Encounter (Signed)
CSW informed by [employer] that pt requesting help making COVID-19 vaccine appt.  CSW able to schedule pt for vaccine on 4/15 at 9:30am at the [facility].  CSW also set up transport through [employer] services to help get pt to the appt.  CSW then followed up on diabetic shoe referral that CHW assisted with.  [employer] reports that they have everything they need but that they have been unable to get a hold of pt to schedule a fitting and need pt to call in the schedule.  CSW informed pt of this and provided with contact number.  CSW will continue to follow and assist as needed  [name], LCSW Clinical Social Worker [facility] Desk#: [phone] Cell#: [phone]

## 2019-12-31 NOTE — Progress Notes (Signed)
[facility] PREP Weekly Session   Patient Details  Name: Benjamin [name] MRN: [id number] Date of Birth: 1965-09-22 Age: 54 y.o. PCP: Claiborne Rigg, NP  Vitals:   12/31/19 1556  Weight: (!) 383 lb (173.7 kg)    [facility] Weekly seesion - 12/31/19 1500      Weekly Session   Topic Discussed  Restaurant Eating   sugar demo   Minutes exercised this week  180 minutes    Classes attended to date  6      Fun things: walk up steps Grateful for walking better Very interested in cooking. Gave him some recipes to try and websites to review for ideas.    [name] 12/31/2019, 3:57 PM

## 2020-01-01 ENCOUNTER — Other Ambulatory Visit (HOSPITAL_COMMUNITY): Payer: Self-pay

## 2020-01-01 ENCOUNTER — Other Ambulatory Visit: Payer: Self-pay

## 2020-01-01 ENCOUNTER — Encounter: Payer: Self-pay | Admitting: Nurse Practitioner

## 2020-01-01 ENCOUNTER — Ambulatory Visit: Payer: Medicaid Other | Attending: Nurse Practitioner | Admitting: Nurse Practitioner

## 2020-01-01 ENCOUNTER — Telehealth (HOSPITAL_COMMUNITY): Payer: Self-pay | Admitting: Licensed Clinical Social Worker

## 2020-01-01 ENCOUNTER — Telehealth: Payer: Self-pay | Admitting: Pulmonary Disease

## 2020-01-01 DIAGNOSIS — E669 Obesity, unspecified: Secondary | ICD-10-CM | POA: Insufficient documentation

## 2020-01-01 DIAGNOSIS — I509 Heart failure, unspecified: Secondary | ICD-10-CM | POA: Insufficient documentation

## 2020-01-01 DIAGNOSIS — E119 Type 2 diabetes mellitus without complications: Secondary | ICD-10-CM | POA: Insufficient documentation

## 2020-01-01 DIAGNOSIS — Z86711 Personal history of pulmonary embolism: Secondary | ICD-10-CM | POA: Diagnosis not present

## 2020-01-01 DIAGNOSIS — G4733 Obstructive sleep apnea (adult) (pediatric): Secondary | ICD-10-CM

## 2020-01-01 DIAGNOSIS — I4891 Unspecified atrial fibrillation: Secondary | ICD-10-CM | POA: Diagnosis not present

## 2020-01-01 DIAGNOSIS — Z87891 Personal history of nicotine dependence: Secondary | ICD-10-CM | POA: Insufficient documentation

## 2020-01-01 DIAGNOSIS — I110 Hypertensive heart disease with heart failure: Secondary | ICD-10-CM | POA: Insufficient documentation

## 2020-01-01 DIAGNOSIS — R1084 Generalized abdominal pain: Secondary | ICD-10-CM | POA: Insufficient documentation

## 2020-01-01 MED ORDER — OMEPRAZOLE 20 MG PO CPDR: 20.0000 mg | DELAYED_RELEASE_CAPSULE | Freq: Two times a day (BID) | ORAL | 2 refills | Status: DC

## 2020-01-01 NOTE — Progress Notes (Signed)
Pt states he is having stomach cramps  Pt states he has been having these cramps for 2 weeks

## 2020-01-01 NOTE — Telephone Encounter (Signed)
Called and spoke with pt in regards to message received from Heart Failure clinic.  Pt stated he is needing a new cpap mask as well as new filters for his machine. Dr. Craige Cotta, please advise if you are okay with [place] placing order for pt to receive these supplies.

## 2020-01-01 NOTE — Progress Notes (Signed)
Came out today for med rec adjustment.  Placed the omeprazole separately to see if that helps his stomach trouble. meds verified and pill box filled.   Kerry Hough, EMT-Paramedic  01/01/20

## 2020-01-01 NOTE — Progress Notes (Signed)
Virtual Visit via Telephone Note Due to national recommendations of social distancing due to [name], telehealth visit is felt to be most appropriate for this patient at this time.  I discussed the limitations, risks, security and privacy concerns of performing an evaluation and management service by telephone and the availability of in person appointments. I also discussed with the patient that there may be a patient responsible charge related to this service. The patient expressed understanding and agreed to proceed.    I connected with Benjamin [name] on 01/01/20  at  10:50 AM EDT  EDT by telephone and verified that I am speaking with the correct person using two identifiers.   Consent I discussed the limitations, risks, security and privacy concerns of performing an evaluation and management service by telephone and the availability of in person appointments. I also discussed with the patient that there may be a patient responsible charge related to this service. The patient expressed understanding and agreed to proceed.   Location of Patient: Private Residence    Location of Provider: [place] participating in Telemedicine visit: [name] [place]    History of Present Illness: Telemedicine visit for: Abdominal Pain  Abdominal Pain: Patient complains of abdominal pain. The pain is described as "feels like my stomach is being tied in knots" and is 6/10 in intensity. Pain is located diffusely without radiation. Onset was 1 month ago. Symptoms have been unchanged since. Aggravating factors: pain seems to be worse after he takes his medications.  Alleviating factors: none. Associated symptoms: none. The patient denies constipation, diarrhea, hematochezia, melena, nausea and vomiting.     Past Medical History:  Diagnosis Date  . A-fib ([place])   . Anemia 05/29/2019  . CHF (congestive heart failure)  ([place])   . Chronic respiratory failure ([place])   . Diabetes mellitus without complication ([place])   . Dyspnea   . Elevated troponin 02/06/2019  . Hypertension   . Obesity   . Pulmonary embolism ([place])   . Rectal bleeding 05/29/2019  . Sleep apnea     Past Surgical History:  Procedure Laterality Date  . COLONOSCOPY WITH PROPOFOL Left 02/25/2019   Procedure: COLONOSCOPY WITH PROPOFOL;  Surgeon: Arta Silence, MD;  Location: [place];  Service: Endoscopy;  Laterality: Left;  . ESOPHAGOGASTRODUODENOSCOPY (EGD) WITH PROPOFOL Left 02/25/2019   Procedure: ESOPHAGOGASTRODUODENOSCOPY (EGD) WITH PROPOFOL;  Surgeon: Arta Silence, MD;  [facility] ENDOSCOPY;  Service: Endoscopy;  Laterality: Left;  . ESOPHAGOGASTRODUODENOSCOPY (EGD) WITH PROPOFOL N/A 06/12/2019   Procedure: ESOPHAGOGASTRODUODENOSCOPY (EGD) WITH PROPOFOL;  Surgeon: Clarene Essex, MD;  Location: [place];  Service: Gastroenterology;  Laterality: N/A;  . FLEXIBLE SIGMOIDOSCOPY N/A 06/12/2019   Procedure: FLEXIBLE SIGMOIDOSCOPY;  Surgeon: Clarene Essex, MD;  Location: [place];  Service: Gastroenterology;  Laterality: N/A;  . HEMORRHOID BANDING  05/2019  . NO PAST SURGERIES    . RIGHT HEART CATH N/A 04/17/2019   Procedure: RIGHT HEART CATH;  Surgeon: Larey Dresser, MD;  Location: [place] [facility];  Service: Cardiovascular;  Laterality: N/A;    Family History  Problem Relation Age of Onset  . Hypertension Mother   . Diabetes Mother     Social History   Socioeconomic History  . Marital status: Single    Spouse name: Not on file  . Number of children: Not on file  . Years of education: Not on file  . Highest education level: Not on file  Occupational History  . Not on file  Tobacco Use  . Smoking status: Former Games developer  . Smokeless tobacco: Never Used  Substance and Sexual Activity  . Alcohol use: Not Currently  . Drug use: Not Currently  . Sexual activity: Not Currently  Other Topics Concern  . Not on file  Social  History Narrative  . Not on file   Social Determinants of Health   Financial Resource Strain: Medium Risk  . Difficulty of Paying Living Expenses: Somewhat hard  Food Insecurity: Food Insecurity Present  . Worried About Programme researcher, broadcasting/film/video in the Last Year: Sometimes true  . Ran Out of Food in the Last Year: Sometimes true  Transportation Needs: No Transportation Needs  . Lack of Transportation (Medical): No  . Lack of Transportation (Non-Medical): No  Physical Activity:   . Days of Exercise per Week:   . Minutes of Exercise per Session:   Stress:   . Feeling of Stress :   Social Connections:   . Frequency of Communication with Friends and Family:   . Frequency of Social Gatherings with Friends and Family:   . Attends Religious Services:   . Active Member of Clubs or Organizations:   . Attends Banker Meetings:   Marland Kitchen Marital Status:      Observations/Objective: Awake, alert and oriented x 3   Review of Systems  Constitutional: Negative for fever, malaise/fatigue and weight loss.  HENT: Negative.  Negative for nosebleeds.   Eyes: Negative.  Negative for blurred vision, double vision and photophobia.  Respiratory: Negative.  Negative for cough and shortness of breath.   Cardiovascular: Negative.  Negative for chest pain, palpitations and leg swelling.  Gastrointestinal: Positive for abdominal pain. Negative for blood in stool, constipation, diarrhea, heartburn, melena, nausea and vomiting.  Musculoskeletal: Negative.  Negative for myalgias.  Neurological: Negative.  Negative for dizziness, focal weakness, seizures and headaches.  Psychiatric/Behavioral: Negative.  Negative for suicidal ideas.    Assessment and Plan: Daesean was seen today for abdominal pain.  Diagnoses and all orders for this visit:  Generalized abdominal pain -     omeprazole (PRILOSEC) 20 MG capsule; Take 1 capsule (20 mg total) by mouth 2 (two) times daily before a meal. Take first capsule  30 min prior to eating or taking other medications. -     US Abdomen Complete; Future Will need to schedule [place] if increase in omeprazole ineffective    Follow Up Instructions Return in about 2 weeks (around 01/15/2020) for abdominal pain and labs. .     I discussed the assessment and treatment plan with the patient. The patient was provided an opportunity to ask questions and all were answered. The patient agreed with the plan and demonstrated an understanding of the instructions.   The patient was advised to call back or seek an in-person evaluation if the symptoms worsen or if the condition fails to improve as anticipated.  I provided 17 minutes of non-face-to-face time during this encounter including median intraservice time, reviewing previous notes, labs, imaging, medications and explaining diagnosis and management.  Claiborne Rigg, FNP-BC

## 2020-01-01 NOTE — Telephone Encounter (Signed)
CSW had been informed by [employer] that they would be able to get pt a new mask through his Medicaid- will need a new order, sleep study, office notes indicating pt compliance and continued benefit, and copy of Medicaid card.  CSW gathering documents and called [facility] to request new order for CPAP mask- once received CSW will send in referral to [employer] to be processed.  CSW will continue to follow and assist as needed  [name], LCSW Clinical Social Worker [facility] Desk#: [phone] Cell#: [phone]

## 2020-01-02 ENCOUNTER — Telehealth (HOSPITAL_COMMUNITY): Payer: Self-pay | Admitting: Licensed Clinical Social Worker

## 2020-01-02 ENCOUNTER — Ambulatory Visit: Payer: Medicaid Other | Attending: Internal Medicine

## 2020-01-02 DIAGNOSIS — Z23 Encounter for immunization: Secondary | ICD-10-CM

## 2020-01-02 NOTE — Progress Notes (Signed)
   Covid-19 Vaccination Clinic  Name:  Benjamin [name]    MRN: [id number] DOB: 08-26-1966  01/02/2020  [name] was observed post Covid-19 immunization for 15 minutes without incident. He was provided with Vaccine Information Sheet and instruction to access the V-Safe system.   [name] was instructed to call 911 with any severe reactions post vaccine: Marland Kitchen Difficulty breathing  . Swelling of face and throat  . A fast heartbeat  . A bad rash all over body  . Dizziness and weakness   Immunizations Administered    Name Date Dose VIS Date Route   Pfizer COVID-19 Vaccine 01/02/2020  9:23 AM 0.3 mL 08/30/2019 Intramuscular   Manufacturer: [employer], [employer]   Lot: [id number]   NDC: [address]-8

## 2020-01-02 NOTE — Telephone Encounter (Signed)
CSW received call from pt confirming that he was able to make it to his COVID-19 vaccine appt.  CSW was also able to inform pt that we have resolved the issue with his CPAP and have located the invoice proving that it was paid for by [facility]- this proof was sent to [employer] so they can update their records and stop trying to repossess the machine.  Pt also informed CSW that he received call from [facility] reporting that they were going to complete order for pt to get a new CPAP mask  CSW will continue to follow and assist as needed  [name], LCSW Clinical Social Worker [facility] Desk#: [phone] Cell#: [phone]

## 2020-01-02 NOTE — Telephone Encounter (Signed)
Okay to send order for new CPAP mask and new filters.

## 2020-01-02 NOTE — Telephone Encounter (Signed)
Called spoke with patient Verified DME as [employer] Order placed Nothing further needed at this time.

## 2020-01-06 ENCOUNTER — Telehealth (HOSPITAL_COMMUNITY): Payer: Self-pay | Admitting: Licensed Clinical Social Worker

## 2020-01-06 ENCOUNTER — Other Ambulatory Visit (HOSPITAL_COMMUNITY): Payer: Self-pay

## 2020-01-06 NOTE — Telephone Encounter (Signed)
CSW informed by [employer] that pt has a new parole office now that his normal one is on parental leave  Lala Lund, phone: [phone]  CSW spoke with Mr. Baird Lyons and ensured that he had my direct phone number in case of any concerns with pt.  Mr. Baird Lyons also requested most recent SSI statement be sent to their office- CSW faxed to them for their records.  CSW will continue to follow and assist as needed  [name], LCSW Clinical Social Worker [facility] Desk#: [phone] Cell#: [phone]

## 2020-01-06 NOTE — Progress Notes (Signed)
Paramedicine Encounter    Patient ID: Benjamin [name], male    DOB: 1966/03/20, 54 y.o.   MRN: [id number]   Patient Care Team: [name], NP as PCP - General (Nurse Practitioner) [name], MD as PCP - Cardiology (Cardiology) [name], LCSW as Social Worker (Licensed Clinical Social Worker)  Patient Active Problem List   Diagnosis Date Noted  . Diabetic neuropathy ([place]) 11/20/2019  . Coagulation disorder ([place]) 11/20/2019  . Acute on chronic diastolic CHF (congestive heart failure), NYHA class 3 ([name]) 07/30/2019  . Diabetes mellitus without complication ([place]) 23/76/2831  . Hypertensive emergency   . BRBPR (bright red blood per rectum) 06/12/2019  . Hypertension   . CHF (congestive heart failure) ([place])   . Sleep apnea   . Chronic respiratory failure ([place])   . Hypokalemia 05/29/2019  . GI bleed 05/29/2019  . Anemia 05/28/2019  . Acute on chronic diastolic CHF (congestive heart failure) ([place]) 04/11/2019  . Iron deficiency anemia 03/11/2019  . OSA on CPAP 03/11/2019  . HLD (hyperlipidemia) 03/11/2019  . Elevated troponin 03/11/2019  . GERD (gastroesophageal reflux disease) 03/11/2019  . Rectal bleeding 02/20/2019  . Dyspnea 02/06/2019  . COVID-19 virus infection 02/06/2019  . Bilateral lower extremity edema   . Morbid obesity with BMI of 50.0-59.9, adult ([place])   . Chronic diastolic CHF (congestive heart failure) ([place])   . PAF (paroxysmal atrial fibrillation) ([place])   . PE (pulmonary thromboembolism) ([place]) 01/21/2019  . Hypertensive urgency 01/21/2019  . Diabetes mellitus type 2 in obese ([place]) 01/21/2019    Current Outpatient Medications:  .  albuterol (VENTOLIN HFA) 108 (90 Base) MCG/ACT inhaler, Inhale 2 puffs into the lungs every 6 (six) hours as needed for wheezing or shortness of breath., Disp: 8.5 g, Rfl: 2 .  amLODipine (NORVASC) 10 MG tablet, Take 1 tablet (10 mg total) by mouth daily., Disp: 90 tablet, Rfl: 1 .  atorvastatin (LIPITOR) 20 MG tablet,  Take 1 tablet (20 mg total) by mouth at bedtime., Disp: 90 tablet, Rfl: 2 .  Blood Glucose Monitoring Suppl (TRUE METRIX METER) w/Device KIT, Use as instructed. Check blood glucose level by fingerstick twice per day., Disp: 1 kit, Rfl: 0 .  Blood Pressure Monitor DEVI, Please provide patient with insurance approved blood pressure device with L-XL cuff. BMI 55, Disp: 1 each, Rfl: 0 .  carboxymethylcellulose (REFRESH PLUS) 0.5 % SOLN, Place 1 drop into both eyes daily. , Disp: , Rfl:  .  docusate sodium (COLACE) 100 MG capsule, Take 100 mg by mouth 2 (two) times daily., Disp: , Rfl:  .  ferrous sulfate 325 (65 FE) MG tablet, Take 1 tablet (325 mg total) by mouth daily. Patient will pick up scripts today., Disp: 90 tablet, Rfl: 0 .  glucose blood (TRUE METRIX BLOOD GLUCOSE TEST) test strip, Use as instructed. Check blood glucose level by fingerstick twice per day., Disp: 100 each, Rfl: 12 .  labetalol (NORMODYNE) 200 MG tablet, Take 2 tablets (400 mg total) by mouth 2 (two) times daily., Disp: 120 tablet, Rfl: 3 .  losartan (COZAAR) 100 MG tablet, Take 1 tablet (100 mg total) by mouth daily. (Patient taking differently: Take 100 mg by mouth at bedtime. ), Disp: 30 tablet, Rfl: 5 .  magnesium oxide (MAG-OX) 400 MG tablet, Take 2 tablets (800 mg total) by mouth every morning AND 1 tablet (400 mg total) every evening., Disp: 90 tablet, Rfl: 6 .  metFORMIN (GLUCOPHAGE) 500 MG tablet, Take 1 tablet (500  mg total) by mouth 2 (two) times daily with a meal. Patient will pick up scripts today., Disp: 180 tablet, Rfl: 1 .  metolazone (ZAROXOLYN) 5 MG tablet, TAKE 1 TABLET EVERY MONDAY, WEDNESDAY, AND FRIDAY., Disp: 15 tablet, Rfl: 3 .  Misc. Devices MISC, Requires O2 @ 3L/min continuously via nasal canula and home fill system,., Disp: 1 each, Rfl: 0 .  Misc. Devices MISC, Please provide Benjamin [name] with insurance approved portable O2 concentrator ICD 10 J96.11 Z99.81, Disp: 1 each, Rfl: 0 .  omeprazole (PRILOSEC)  20 MG capsule, Take 1 capsule (20 mg total) by mouth 2 (two) times daily before a meal. Take first capsule 30 min prior to eating or taking other medications., Disp: 60 capsule, Rfl: 2 .  potassium chloride SA (KLOR-CON) 20 MEQ tablet, Take 60 meq (3 tabs) in the morning, 60 meq (3 tabs) in the afternoon and 40 meq (2 tabs) in the evening.  Take an extra 2 tabs every dose of metolazone, Disp: 360 tablet, Rfl: 3 .  rivaroxaban (XARELTO) 20 MG TABS tablet, Take 1 tablet (20 mg total) by mouth daily with supper., Disp: 60 tablet, Rfl: 6 .  spironolactone (ALDACTONE) 50 MG tablet, Take 1 tablet (50 mg total) by mouth daily., Disp: 30 tablet, Rfl: 3 .  torsemide (DEMADEX) 100 MG tablet, Take 1 tablet (100 mg total) by mouth 2 (two) times daily., Disp: 180 tablet, Rfl: 3 .  TRUEplus Lancets 28G MISC, Use as instructed. Check blood glucose level by fingerstick twice per day., Disp: 200 each, Rfl: 3 .  hydrALAZINE (APRESOLINE) 25 MG tablet, Take 1 tablet (25 mg total) by mouth 3 (three) times daily. (Patient not taking: Reported on 01/01/2020), Disp: 270 tablet, Rfl: 3 .  levalbuterol (XOPENEX HFA) 45 MCG/ACT inhaler, Inhale 2 puffs into the lungs every 4 (four) hours as needed for wheezing. , Disp: , Rfl:  No Known Allergies    Social History   Socioeconomic History  . Marital status: Single    Spouse name: Not on file  . Number of children: Not on file  . Years of education: Not on file  . Highest education level: Not on file  Occupational History  . Not on file  Tobacco Use  . Smoking status: Former Research scientist (life sciences)  . Smokeless tobacco: Never Used  Substance and Sexual Activity  . Alcohol use: Not Currently  . Drug use: Not Currently  . Sexual activity: Not Currently  Other Topics Concern  . Not on file  Social History Narrative  . Not on file   Social Determinants of Health   Financial Resource Strain: Medium Risk  . Difficulty of Paying Living Expenses: Somewhat hard  Food Insecurity: Food  Insecurity Present  . Worried About Charity fundraiser in the Last Year: Sometimes true  . Ran Out of Food in the Last Year: Sometimes true  Transportation Needs: No Transportation Needs  . Lack of Transportation (Medical): No  . Lack of Transportation (Non-Medical): No  Physical Activity:   . Days of Exercise per Week:   . Minutes of Exercise per Session:   Stress:   . Feeling of Stress :   Social Connections:   . Frequency of Communication with Friends and Family:   . Frequency of Social Gatherings with Friends and Family:   . Attends Religious Services:   . Active Member of Clubs or Organizations:   . Attends Archivist Meetings:   Marland Kitchen Marital Status:   Intimate Partner Violence:   .  Fear of Current or Ex-Partner:   . Emotionally Abused:   Marland Kitchen Physically Abused:   . Sexually Abused:     Physical Exam      Future Appointments  Date Time Provider [place]  01/13/2020 12:00 PM [facility] PA/NP [facility] None  01/17/2020 10:50 AM [name], NP [facility] None  01/29/2020  9:00 AM [facility]    BP (!) 162/0   Pulse 92   Temp 98.4 F (36.9 C)   Resp 20   Wt (!) 404 lb (183.3 kg)   SpO2 96%   BMI 56.35 kg/m   Weight yesterday-387 Last visit weight-383  Pt reports he is doing ok, he has new probation officer-cory on FMLA paternity leave. He saw him this morning and he requested that he be faxed paperwork showing his SSI.  He states he has been having intermittent bleeding-he has been straining for BM-he has been taking miralax so that helped.  Has not been able to wear CPAP mask due to strong odor of mask-a new one has been ordered. He called [employer] to make sure his mask and supplies were ordered-they had to update his insurance info so that was given to them. They said 7-10 business days it would be sent out.  She advised the local [employer] store should have cleaning supplies. But no local store here in [place].    He  states he now gets h/a thumping when he takes his bedtime dose. He has been taking his omeprazole 80mn prior his other meds, still vomiting it up a few times.  --needs labetalol refilled.   meds verified and pill box refilled.   His weight is up, he said he had to drink a lot of water yesterday to help with the taste of the potassium so he took in way too much fluids yesterday. Today is metolazone day. I told him to text me or call in the morning to let me know his weight.  He is coughing up phelgm this morning.  He is sob at times with exertion.  He has appoint next week. He is not taking the hydralazine due to it was hurting his stomach. He does feel better with that but his b/p still elevated.   KMarylouise Stacks EMackinaw CityCAu Medical CenterParamedic  01/06/20

## 2020-01-07 ENCOUNTER — Telehealth (HOSPITAL_COMMUNITY): Payer: Self-pay

## 2020-01-07 DIAGNOSIS — G4733 Obstructive sleep apnea (adult) (pediatric): Secondary | ICD-10-CM | POA: Diagnosis not present

## 2020-01-07 NOTE — Progress Notes (Signed)
[facility] PREP Weekly Session   Patient Details  Name: Benjamin [name] MRN: [id number] Date of Birth: September 08, 1966 Age: 54 y.o. PCP: Claiborne Rigg, NP  Vitals:   01/07/20 1600  Weight: (!) 390 lb (176.9 kg)    [facility] Weekly seesion - 01/07/20 1600      Weekly Session   Topic Discussed  Stress management and problem solving    Minutes exercised this week  --   walked hallways 194 ft x 2   Classes attended to date  8      Fun things since last meeting: worked out Optician, dispensing for working out.   Starting to make better food choices Exercised on own today without assist, did well.  Likes strength training.    [name] 01/07/2020, 4:01 PM

## 2020-01-07 NOTE — Telephone Encounter (Signed)
Pt called me this morning to report his weight of 383lbs. Yesterday was metolazone day and he feels a lot better today.   Kerry Hough, EMT-Paramedic 01/07/20

## 2020-01-08 ENCOUNTER — Telehealth (HOSPITAL_COMMUNITY): Payer: Self-pay | Admitting: Licensed Clinical Social Worker

## 2020-01-08 ENCOUNTER — Other Ambulatory Visit (HOSPITAL_COMMUNITY): Payer: Self-pay | Admitting: *Deleted

## 2020-01-08 DIAGNOSIS — I10 Essential (primary) hypertension: Secondary | ICD-10-CM

## 2020-01-08 DIAGNOSIS — E876 Hypokalemia: Secondary | ICD-10-CM

## 2020-01-08 MED ORDER — POTASSIUM CHLORIDE CRYS ER 10 MEQ PO TBCR: EXTENDED_RELEASE_TABLET | ORAL | 3 refills | Status: DC

## 2020-01-08 NOTE — Telephone Encounter (Signed)
CSW informed by [employer] that pt is interested in switching his potassium to lower dose pills so they are easier to swallow- hopeful this will make it easier for him to take and that it might not have the same effects on his stomach as the larger pills.  CSW sent request to triage to send in script for 10mg  potassium to pt pharmacy  CSW will continue to follow and assist as needed  , LCSW Clinical Social Worker [facility] Desk#: [phone] Cell#: [phone]

## 2020-01-10 DIAGNOSIS — I5032 Chronic diastolic (congestive) heart failure: Secondary | ICD-10-CM | POA: Diagnosis not present

## 2020-01-10 DIAGNOSIS — J9611 Chronic respiratory failure with hypoxia: Secondary | ICD-10-CM | POA: Diagnosis not present

## 2020-01-10 NOTE — Progress Notes (Signed)
Transport for PREP class arranged for next week, M/T/TH at 230pm arrival, return 4pm via [employer]. Patient updated via text.

## 2020-01-13 ENCOUNTER — Other Ambulatory Visit (HOSPITAL_COMMUNITY): Payer: Self-pay

## 2020-01-13 ENCOUNTER — Other Ambulatory Visit (HOSPITAL_COMMUNITY): Payer: Self-pay | Admitting: *Deleted

## 2020-01-13 ENCOUNTER — Encounter (HOSPITAL_COMMUNITY): Payer: Medicaid Other

## 2020-01-13 MED ORDER — LABETALOL HCL 200 MG PO TABS: 400.0000 mg | ORAL_TABLET | Freq: Two times a day (BID) | ORAL | 3 refills | Status: DC

## 2020-01-13 NOTE — Progress Notes (Signed)
Paramedicine Encounter   Patient ID: EDOUARD [name] , male,   DOB: [birth year],54 y.o.,  MRN: [id number]  Pt did not come to clinic today- he states cab did not show up and now he has arranged [facility] [employer] [facility] and he doesn't want to miss that either.  Let clinic know to resch.    Kerry Hough, EMT-Paramedic [phone] 01/13/2020

## 2020-01-13 NOTE — Progress Notes (Signed)
Paramedicine Encounter    Patient ID: Benjamin [name], male    DOB: 1966/03/20, 54 y.o.   MRN: [id number]   Patient Care Team: [name], NP as PCP - General (Nurse Practitioner) [name], MD as PCP - Cardiology (Cardiology) [name], LCSW as Social Worker (Licensed Clinical Social Worker)  Patient Active Problem List   Diagnosis Date Noted  . Diabetic neuropathy ([place]) 11/20/2019  . Coagulation disorder ([place]) 11/20/2019  . Acute on chronic diastolic CHF (congestive heart failure), NYHA class 3 ([name]) 07/30/2019  . Diabetes mellitus without complication ([place]) 23/76/2831  . Hypertensive emergency   . BRBPR (bright red blood per rectum) 06/12/2019  . Hypertension   . CHF (congestive heart failure) ([place])   . Sleep apnea   . Chronic respiratory failure ([place])   . Hypokalemia 05/29/2019  . GI bleed 05/29/2019  . Anemia 05/28/2019  . Acute on chronic diastolic CHF (congestive heart failure) ([place]) 04/11/2019  . Iron deficiency anemia 03/11/2019  . OSA on CPAP 03/11/2019  . HLD (hyperlipidemia) 03/11/2019  . Elevated troponin 03/11/2019  . GERD (gastroesophageal reflux disease) 03/11/2019  . Rectal bleeding 02/20/2019  . Dyspnea 02/06/2019  . COVID-19 virus infection 02/06/2019  . Bilateral lower extremity edema   . Morbid obesity with BMI of 50.0-59.9, adult ([place])   . Chronic diastolic CHF (congestive heart failure) ([place])   . PAF (paroxysmal atrial fibrillation) ([place])   . PE (pulmonary thromboembolism) ([place]) 01/21/2019  . Hypertensive urgency 01/21/2019  . Diabetes mellitus type 2 in obese ([place]) 01/21/2019    Current Outpatient Medications:  .  albuterol (VENTOLIN HFA) 108 (90 Base) MCG/ACT inhaler, Inhale 2 puffs into the lungs every 6 (six) hours as needed for wheezing or shortness of breath., Disp: 8.5 g, Rfl: 2 .  amLODipine (NORVASC) 10 MG tablet, Take 1 tablet (10 mg total) by mouth daily., Disp: 90 tablet, Rfl: 1 .  atorvastatin (LIPITOR) 20 MG tablet,  Take 1 tablet (20 mg total) by mouth at bedtime., Disp: 90 tablet, Rfl: 2 .  Blood Glucose Monitoring Suppl (TRUE METRIX METER) w/Device KIT, Use as instructed. Check blood glucose level by fingerstick twice per day., Disp: 1 kit, Rfl: 0 .  Blood Pressure Monitor DEVI, Please provide patient with insurance approved blood pressure device with L-XL cuff. BMI 55, Disp: 1 each, Rfl: 0 .  carboxymethylcellulose (REFRESH PLUS) 0.5 % SOLN, Place 1 drop into both eyes daily. , Disp: , Rfl:  .  docusate sodium (COLACE) 100 MG capsule, Take 100 mg by mouth 2 (two) times daily., Disp: , Rfl:  .  ferrous sulfate 325 (65 FE) MG tablet, Take 1 tablet (325 mg total) by mouth daily. Patient will pick up scripts today., Disp: 90 tablet, Rfl: 0 .  glucose blood (TRUE METRIX BLOOD GLUCOSE TEST) test strip, Use as instructed. Check blood glucose level by fingerstick twice per day., Disp: 100 each, Rfl: 12 .  labetalol (NORMODYNE) 200 MG tablet, Take 2 tablets (400 mg total) by mouth 2 (two) times daily., Disp: 120 tablet, Rfl: 3 .  losartan (COZAAR) 100 MG tablet, Take 1 tablet (100 mg total) by mouth daily. (Patient taking differently: Take 100 mg by mouth at bedtime. ), Disp: 30 tablet, Rfl: 5 .  magnesium oxide (MAG-OX) 400 MG tablet, Take 2 tablets (800 mg total) by mouth every morning AND 1 tablet (400 mg total) every evening., Disp: 90 tablet, Rfl: 6 .  metFORMIN (GLUCOPHAGE) 500 MG tablet, Take 1 tablet (500  mg total) by mouth 2 (two) times daily with a meal. Patient will pick up scripts today., Disp: 180 tablet, Rfl: 1 .  metolazone (ZAROXOLYN) 5 MG tablet, TAKE 1 TABLET EVERY MONDAY, WEDNESDAY, AND FRIDAY., Disp: 15 tablet, Rfl: 3 .  Misc. Devices MISC, Requires O2 @ 3L/min continuously via nasal canula and home fill system,., Disp: 1 each, Rfl: 0 .  Misc. Devices MISC, Please provide Mr. Petrenko with insurance approved portable O2 concentrator ICD 10 J96.11 Z99.81, Disp: 1 each, Rfl: 0 .  omeprazole (PRILOSEC)  20 MG capsule, Take 1 capsule (20 mg total) by mouth 2 (two) times daily before a meal. Take first capsule 30 min prior to eating or taking other medications., Disp: 60 capsule, Rfl: 2 .  potassium chloride SA (KLOR-CON) 10 MEQ tablet, Take 60 meq (6 tabs) in the morning, 60 meq (6 tabs) in the afternoon and 40 meq (4 tabs) in the evening.  Take an extra 4 tabs every dose of metolazone, Disp: 540 tablet, Rfl: 3 .  rivaroxaban (XARELTO) 20 MG TABS tablet, Take 1 tablet (20 mg total) by mouth daily with supper., Disp: 60 tablet, Rfl: 6 .  spironolactone (ALDACTONE) 50 MG tablet, Take 1 tablet (50 mg total) by mouth daily., Disp: 30 tablet, Rfl: 3 .  torsemide (DEMADEX) 100 MG tablet, Take 1 tablet (100 mg total) by mouth 2 (two) times daily., Disp: 180 tablet, Rfl: 3 .  TRUEplus Lancets 28G MISC, Use as instructed. Check blood glucose level by fingerstick twice per day., Disp: 200 each, Rfl: 3 .  hydrALAZINE (APRESOLINE) 25 MG tablet, Take 1 tablet (25 mg total) by mouth 3 (three) times daily. (Patient not taking: Reported on 01/01/2020), Disp: 270 tablet, Rfl: 3 .  levalbuterol (XOPENEX HFA) 45 MCG/ACT inhaler, Inhale 2 puffs into the lungs every 4 (four) hours as needed for wheezing. , Disp: , Rfl:  No Known Allergies    Social History   Socioeconomic History  . Marital status: Single    Spouse name: Not on file  . Number of children: Not on file  . Years of education: Not on file  . Highest education level: Not on file  Occupational History  . Not on file  Tobacco Use  . Smoking status: Former Research scientist (life sciences)  . Smokeless tobacco: Never Used  Substance and Sexual Activity  . Alcohol use: Not Currently  . Drug use: Not Currently  . Sexual activity: Not Currently  Other Topics Concern  . Not on file  Social History Narrative  . Not on file   Social Determinants of Health   Financial Resource Strain: Medium Risk  . Difficulty of Paying Living Expenses: Somewhat hard  Food Insecurity: Food  Insecurity Present  . Worried About Charity fundraiser in the Last Year: Sometimes true  . Ran Out of Food in the Last Year: Sometimes true  Transportation Needs: No Transportation Needs  . Lack of Transportation (Medical): No  . Lack of Transportation (Non-Medical): No  Physical Activity:   . Days of Exercise per Week:   . Minutes of Exercise per Session:   Stress:   . Feeling of Stress :   Social Connections:   . Frequency of Communication with Friends and Family:   . Frequency of Social Gatherings with Friends and Family:   . Attends Religious Services:   . Active Member of Clubs or Organizations:   . Attends Archivist Meetings:   Marland Kitchen Marital Status:   Intimate Partner Violence:   .  Fear of Current or Ex-Partner:   . Emotionally Abused:   Marland Kitchen Physically Abused:   . Sexually Abused:     Physical Exam      Future Appointments  Date Time Provider [place]  01/17/2020 10:50 AM [name], NP [facility] None  01/20/2020  3:30 PM [facility] PA/NP [facility] None  01/29/2020  9:00 AM [facility]    BP (!) 150/0   Pulse 100   Temp (!) 97.2 F (36.2 C)   Resp 18   Wt (!) 384 lb (174.2 kg)   SpO2 97%   BMI 53.56 kg/m   Weight yesterday-?? Last visit weight-404  Pt reports he is doing well. He denies increased sob, no dizziness, his stomach still comes and goes with his pain and nausea.  [facility] He plans on going [facility] often.  Pt missed Saturday dose of pills for the whole day and sun pm dose of meds. Pt states he was so tired and he slept all day.  He got his supplies for his CPAP. He wants a cleaning machine for it but not sure if insurance will cover it. He is going to call about it tomor.  --needs metformin for next week meds verified and pill box refilled.    [name], [facility] Paramedic  01/13/20

## 2020-01-14 DIAGNOSIS — Z7689 Persons encountering health services in other specified circumstances: Secondary | ICD-10-CM | POA: Diagnosis not present

## 2020-01-15 ENCOUNTER — Telehealth (HOSPITAL_COMMUNITY): Payer: Self-pay | Admitting: *Deleted

## 2020-01-15 NOTE — Telephone Encounter (Signed)
Contacted patient to follow-up with PREP program and attendance and to discuss future of his active lifestyle plan. He would like to do the program again, however the program is not designed for repeat. We discussed rolling his [employer] and will look into [employer] scholarship vs. Other local gym membership prices. We will continue to discuss and look for his most affordable options so that he will be able to maintain his active lifestyle to achieve his goals.    Lesia Hausen, MS, ACSM, [facility] Clinical Exercise Physiologist/ Health and Wellness Coach

## 2020-01-17 ENCOUNTER — Other Ambulatory Visit: Payer: Self-pay

## 2020-01-17 ENCOUNTER — Encounter: Payer: Self-pay | Admitting: Nurse Practitioner

## 2020-01-17 ENCOUNTER — Ambulatory Visit: Payer: Medicaid Other | Attending: Nurse Practitioner | Admitting: Nurse Practitioner

## 2020-01-17 VITALS — BP 148/71 | HR 91 | Temp 97.7°F | Ht 71.0 in | Wt 396.0 lb

## 2020-01-17 DIAGNOSIS — Z7901 Long term (current) use of anticoagulants: Secondary | ICD-10-CM | POA: Insufficient documentation

## 2020-01-17 DIAGNOSIS — E119 Type 2 diabetes mellitus without complications: Secondary | ICD-10-CM | POA: Diagnosis not present

## 2020-01-17 DIAGNOSIS — Z7984 Long term (current) use of oral hypoglycemic drugs: Secondary | ICD-10-CM | POA: Diagnosis not present

## 2020-01-17 DIAGNOSIS — Z833 Family history of diabetes mellitus: Secondary | ICD-10-CM | POA: Insufficient documentation

## 2020-01-17 DIAGNOSIS — E669 Obesity, unspecified: Secondary | ICD-10-CM | POA: Diagnosis not present

## 2020-01-17 DIAGNOSIS — I10 Essential (primary) hypertension: Secondary | ICD-10-CM

## 2020-01-17 DIAGNOSIS — K219 Gastro-esophageal reflux disease without esophagitis: Secondary | ICD-10-CM | POA: Insufficient documentation

## 2020-01-17 DIAGNOSIS — I110 Hypertensive heart disease with heart failure: Secondary | ICD-10-CM | POA: Diagnosis not present

## 2020-01-17 DIAGNOSIS — Z86711 Personal history of pulmonary embolism: Secondary | ICD-10-CM | POA: Diagnosis not present

## 2020-01-17 DIAGNOSIS — G4733 Obstructive sleep apnea (adult) (pediatric): Secondary | ICD-10-CM | POA: Insufficient documentation

## 2020-01-17 DIAGNOSIS — Z125 Encounter for screening for malignant neoplasm of prostate: Secondary | ICD-10-CM | POA: Insufficient documentation

## 2020-01-17 DIAGNOSIS — E118 Type 2 diabetes mellitus with unspecified complications: Secondary | ICD-10-CM

## 2020-01-17 DIAGNOSIS — E1165 Type 2 diabetes mellitus with hyperglycemia: Secondary | ICD-10-CM | POA: Diagnosis not present

## 2020-01-17 DIAGNOSIS — I4891 Unspecified atrial fibrillation: Secondary | ICD-10-CM | POA: Insufficient documentation

## 2020-01-17 DIAGNOSIS — IMO0002 Reserved for concepts with insufficient information to code with codable children: Secondary | ICD-10-CM

## 2020-01-17 DIAGNOSIS — Z6841 Body Mass Index (BMI) 40.0 and over, adult: Secondary | ICD-10-CM | POA: Insufficient documentation

## 2020-01-17 DIAGNOSIS — Z79899 Other long term (current) drug therapy: Secondary | ICD-10-CM | POA: Diagnosis not present

## 2020-01-17 DIAGNOSIS — I5030 Unspecified diastolic (congestive) heart failure: Secondary | ICD-10-CM | POA: Diagnosis not present

## 2020-01-17 DIAGNOSIS — Z7689 Persons encountering health services in other specified circumstances: Secondary | ICD-10-CM | POA: Diagnosis not present

## 2020-01-17 DIAGNOSIS — Z8249 Family history of ischemic heart disease and other diseases of the circulatory system: Secondary | ICD-10-CM | POA: Insufficient documentation

## 2020-01-17 LAB — POCT GLYCOSYLATED HEMOGLOBIN (HGB A1C): Hemoglobin A1C: 6.2 % — AB (ref 4.0–5.6)

## 2020-01-17 LAB — GLUCOSE, POCT (MANUAL RESULT ENTRY): POC Glucose: 160 mg/dl — AB (ref 70–99)
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
## 2020-01-18 ENCOUNTER — Encounter: Payer: Self-pay | Admitting: Nurse Practitioner

## 2020-01-18 DIAGNOSIS — J9611 Chronic respiratory failure with hypoxia: Secondary | ICD-10-CM | POA: Diagnosis not present

## 2020-01-18 DIAGNOSIS — I5032 Chronic diastolic (congestive) heart failure: Secondary | ICD-10-CM | POA: Diagnosis not present

## 2020-01-18 LAB — LIPID PANEL
Chol/HDL Ratio: 3.7 ratio (ref 0.0–5.0)
Cholesterol, Total: 174 mg/dL (ref 100–199)
HDL: 47 mg/dL (ref >39)
LDL Chol Calc (NIH): 110 mg/dL — ABNORMAL HIGH (ref 0–99)
Triglycerides: 92 mg/dL (ref 0–149)
VLDL Cholesterol Cal: 17 mg/dL (ref 5–40)

## 2020-01-18 LAB — PSA: Prostate Specific Ag, Serum: 1.6 ng/mL (ref 0.0–4.0)

## 2020-01-20 ENCOUNTER — Other Ambulatory Visit: Payer: Self-pay

## 2020-01-20 ENCOUNTER — Other Ambulatory Visit (HOSPITAL_COMMUNITY): Payer: Self-pay

## 2020-01-20 ENCOUNTER — Ambulatory Visit (HOSPITAL_COMMUNITY)
Admission: RE | Admit: 2020-01-20 | Discharge: 2020-01-20 | Disposition: A | Discharge status: Home / Self Care | Payer: Medicaid Other | Source: Ambulatory Visit | Attending: Internal Medicine | Admitting: Internal Medicine

## 2020-01-20 ENCOUNTER — Encounter (HOSPITAL_COMMUNITY): Payer: Self-pay

## 2020-01-20 VITALS — BP 156/92 | HR 93 | Wt >= 6400 oz

## 2020-01-20 DIAGNOSIS — Z79899 Other long term (current) drug therapy: Secondary | ICD-10-CM | POA: Diagnosis not present

## 2020-01-20 DIAGNOSIS — Z8249 Family history of ischemic heart disease and other diseases of the circulatory system: Secondary | ICD-10-CM | POA: Insufficient documentation

## 2020-01-20 DIAGNOSIS — Z8616 Personal history of COVID-19: Secondary | ICD-10-CM | POA: Diagnosis not present

## 2020-01-20 DIAGNOSIS — Z6841 Body Mass Index (BMI) 40.0 and over, adult: Secondary | ICD-10-CM | POA: Insufficient documentation

## 2020-01-20 DIAGNOSIS — E782 Mixed hyperlipidemia: Secondary | ICD-10-CM

## 2020-01-20 DIAGNOSIS — G4733 Obstructive sleep apnea (adult) (pediatric): Secondary | ICD-10-CM | POA: Insufficient documentation

## 2020-01-20 DIAGNOSIS — Z87891 Personal history of nicotine dependence: Secondary | ICD-10-CM | POA: Diagnosis not present

## 2020-01-20 DIAGNOSIS — I5033 Acute on chronic diastolic (congestive) heart failure: Secondary | ICD-10-CM | POA: Diagnosis not present

## 2020-01-20 DIAGNOSIS — I480 Paroxysmal atrial fibrillation: Secondary | ICD-10-CM | POA: Insufficient documentation

## 2020-01-20 DIAGNOSIS — I10 Essential (primary) hypertension: Secondary | ICD-10-CM | POA: Diagnosis not present

## 2020-01-20 DIAGNOSIS — I110 Hypertensive heart disease with heart failure: Secondary | ICD-10-CM | POA: Insufficient documentation

## 2020-01-20 DIAGNOSIS — Z7984 Long term (current) use of oral hypoglycemic drugs: Secondary | ICD-10-CM | POA: Diagnosis not present

## 2020-01-20 DIAGNOSIS — E119 Type 2 diabetes mellitus without complications: Secondary | ICD-10-CM | POA: Diagnosis not present

## 2020-01-20 DIAGNOSIS — Z7901 Long term (current) use of anticoagulants: Secondary | ICD-10-CM | POA: Insufficient documentation

## 2020-01-20 DIAGNOSIS — I5032 Chronic diastolic (congestive) heart failure: Secondary | ICD-10-CM

## 2020-01-20 DIAGNOSIS — K219 Gastro-esophageal reflux disease without esophagitis: Secondary | ICD-10-CM | POA: Diagnosis not present

## 2020-01-20 DIAGNOSIS — R0602 Shortness of breath: Secondary | ICD-10-CM | POA: Diagnosis not present

## 2020-01-20 DIAGNOSIS — Z86711 Personal history of pulmonary embolism: Secondary | ICD-10-CM | POA: Insufficient documentation

## 2020-01-20 DIAGNOSIS — D649 Anemia, unspecified: Secondary | ICD-10-CM | POA: Diagnosis not present

## 2020-01-20 MED ORDER — ATORVASTATIN CALCIUM 40 MG PO TABS: 40.0000 mg | ORAL_TABLET | Freq: Every day | ORAL | 2 refills | Status: DC

## 2020-01-20 NOTE — Patient Instructions (Signed)
INCREASE Atorvastatin to 40 mg, one tab daily at bedtime  Your physician recommends that you schedule a follow-up appointment in: 4 weeks  in the Advanced Practitioners (PA/NP) Clinic    Do the following things EVERYDAY: 1) Weigh yourself in the morning before breakfast. Write it down and keep it in a log. 2) Take your medicines as prescribed 3) Eat low salt foods--Limit salt (sodium) to 2000 mg per day.  4) Stay as active as you can everyday 5) Limit all fluids for the day to less than 2 liters  At the Advanced [facility] Failure Clinic, you and your health needs are our priority. As part of our continuing mission to provide you with exceptional heart care, we have created designated Provider Care Teams. These Care Teams include your primary Cardiologist (physician) and Advanced Practice Providers (APPs- Physician Assistants and Nurse Practitioners) who all work together to provide you with the care you need, when you need it.   You may see any of the following providers on your designated Care Team at your next follow up: Marland Kitchen Dr Arvilla Meres . Dr Marca Ancona . Tonye Becket, NP . Robbie Lis, PA . Karle Plumber, PharmD   Please be sure to bring in all your medications bottles to every appointment.

## 2020-01-20 NOTE — Progress Notes (Signed)
Paramedicine Encounter   Patient ID: Benjamin [name] , male,   DOB: 1966-09-14,54 y.o.,  MRN: [id number]   Met patient in clinic today with provider.  Weight @ clinic-403 B/p-156/92 p-92 sp02-96  Weight yesterday @ [facility]-022 Pt reports he was mad that he had to come here instead so he ate 4 sandwiches and drank a 3L drink. And he did not take any medicine today.  cholesterol levels elevated. Atorvastatin being increased.  meds verified and pill box refilled at clinic.  He has metformin in mon-wed. I will get from pharmacy and will see if his potassium and new dose of atorvastatin is ready.  He needs his iron and colace-he said he would get from [facility] this week.   [name], [place] 01/20/2020

## 2020-01-20 NOTE — Progress Notes (Signed)
Advanced Heart Failure Clinic Note   Referring Physician: PCP: [name], NP PCP-Cardiologist: Dorris Carnes, MD  [facility]: Dr. Aundra Dubin   HPI: Mr.Williamsis a 54 yo with hx of diastolic CHF, HTN, DM, Atrial fib, PE in April 2020 (on Xarelto), OSA, GERD,chronic elevation of troponin.  Admitted 01/21/2019 with increased dyspnea and had PE. He was anticoagulated.   Patient re-admitted 5/20 for syncope/cough/dyspnea, COVID+, CT positive for bilateral GGOs, admitted to [facility] and discharged after 6 days, on O2. Rec'd Cards f/u.  Patient re-admitted 02/20/19 for bright red blood per rectum, eval'ed by GI, had EGD/colon, dx'd with hemorrhoidal bleeding, discharged after 9 days on O2.He was again Covid + .   Readmitted 03/11/19 with increased shortness of breath. He did not have home oxygen. He was discharged to home the next day.  He presented to [facility] ED7/23/20with lower extremity edema. COVID negative. Diuresed with lasix drip and transitioned to torsemide 40 mg twice daily. Had [place] with mild volume overload and preserved cardiac output. Discharge weight was 407.9 pounds.   He was readmitted with symptomatic anemia 05/28/19 with hgb 7.1. Received 1UPRBCs. GI consulted. He started on anusol and continued on stool softner.  He was readmitted to the hospital on 07/16/19 when he presented to the ED w/ worsening symptoms and was admitted by IM for a/c diastolic CHF and treated w/ IV Lasix. Had 2 day hospital stay and was discharged on 10/29. Echo was done 10/28 and showed normal LVEF 65-70%, RV interpreted as normal but suspect significant RV dysfunction.  After diuresis w/ IV Lasix, he was placed back on home diuretic regimen, torsemide 100 mg bid. Lisinopril 5 mg was also added to regimen. AHF team was not consulted that admit.  He was seen back in clinic 07/30/19. Felt poorly. SOB at rest and worse w/ exertion. Also w/ orthopnea/PND. Significant wt gain of 20 lb up to 428 lb (dry wt ~407 lb),  w/ marked abdominal distention and LEE and poor response to IV diuretics.  Once diuresed transitioned to torsemide 100 mg twice a day. D/C 408 pounds.   Zio patch on 10/20/19. No arrhythmia.   Today he returns for HF follow up.Overall feeling fine.Frustrated about his living situation.  SOB with exertion. Denies PND. + Orthopnea.  Drinking > 3 liters and eating lots of food. Had 2 whooper's today. Appetite ok. No fever or chills. Weight at home 284 -290 pounds. Says he didn't feel like to taking his medications today. Followed by HF Paramedicine.    Labs (11/20): BNP 32, K 3.6, creatinine 1.09 Labs (12/20): K 3.3, creatinine 1.28 Labs (10/10/19): K 3.9 Creatinine 1.4  Labs (11/05/19) : K 3.2 Creatinine 1.47  Labs (12/30/2019) : K 3.5 Creatinine 1.14   Review of Systems: All systems reviewed and negative except as per HPI.   PMH: 1. Atrial fibrillation: Paroxysmal.  2. Pulmonary embolus: 5/20.  3. OHS/OSA: He is on home oxygen during the day and uses CPAP at night.  4. Morbid obesity.  5. Chronic diastolic CHF:  - RHC (9/03): mean RA 12, PA 40/25 mean 31, mean PCWP 23, CI 2.47, PVR 1.1 WU.  - Echo (10/20): EF 65-70%, mild LVH, normal RV size and systolic function.  6. Type 2 diabetes 7. HTN 8. Rectal bleeding: ?Hemorrhoidal.  9. COVID-19 infection 7/20.  1-. ZIo Patch - 10/20/19 no arrhythmias  Current Outpatient Medications  Medication Sig Dispense Refill  . albuterol (VENTOLIN HFA) 108 (90 Base) MCG/ACT inhaler Inhale 2 puffs into the lungs  every 6 (six) hours as needed for wheezing or shortness of breath. 8.5 g 2  . amLODipine (NORVASC) 10 MG tablet Take 1 tablet (10 mg total) by mouth daily. 90 tablet 1  . atorvastatin (LIPITOR) 20 MG tablet Take 1 tablet (20 mg total) by mouth at bedtime. 90 tablet 2  . Blood Glucose Monitoring Suppl (TRUE METRIX METER) w/Device KIT Use as instructed. Check blood glucose level by fingerstick twice per day. 1 kit 0  . Blood Pressure Monitor DEVI  Please provide patient with insurance approved blood pressure device with L-XL cuff. BMI 55 1 each 0  . carboxymethylcellulose (REFRESH PLUS) 0.5 % SOLN Place 1 drop into both eyes daily.     Marland Kitchen docusate sodium (COLACE) 100 MG capsule Take 100 mg by mouth 2 (two) times daily.    . ferrous sulfate 325 (65 FE) MG tablet Take 1 tablet (325 mg total) by mouth daily. Patient will pick up scripts today. 90 tablet 0  . glucose blood (TRUE METRIX BLOOD GLUCOSE TEST) test strip Use as instructed. Check blood glucose level by fingerstick twice per day. 100 each 12  . labetalol (NORMODYNE) 200 MG tablet Take 2 tablets (400 mg total) by mouth 2 (two) times daily. 120 tablet 3  . levalbuterol (XOPENEX HFA) 45 MCG/ACT inhaler Inhale 2 puffs into the lungs every 4 (four) hours as needed for wheezing.     Marland Kitchen losartan (COZAAR) 100 MG tablet Take 1 tablet (100 mg total) by mouth daily. (Patient taking differently: Take 100 mg by mouth at bedtime. ) 30 tablet 5  . magnesium oxide (MAG-OX) 400 MG tablet Take 2 tablets (800 mg total) by mouth every morning AND 1 tablet (400 mg total) every evening. 90 tablet 6  . metFORMIN (GLUCOPHAGE) 500 MG tablet Take 1 tablet (500 mg total) by mouth 2 (two) times daily with a meal. Patient will pick up scripts today. 180 tablet 1  . metolazone (ZAROXOLYN) 5 MG tablet TAKE 1 TABLET EVERY MONDAY, WEDNESDAY, AND FRIDAY. 15 tablet 3  . Misc. Devices MISC Requires O2 @ 3L/min continuously via nasal canula and home fill system,. 1 each 0  . Misc. Devices MISC Please provide Mr. Enck with insurance approved portable O2 concentrator ICD 10 J96.11 Z99.81 1 each 0  . omeprazole (PRILOSEC) 20 MG capsule Take 1 capsule (20 mg total) by mouth 2 (two) times daily before a meal. Take first capsule 30 min prior to eating or taking other medications. 60 capsule 2  . potassium chloride SA (KLOR-CON) 10 MEQ tablet Take 60 meq (6 tabs) in the morning, 60 meq (6 tabs) in the afternoon and 40 meq (4 tabs)  in the evening.  Take an extra 4 tabs every dose of metolazone 540 tablet 3  . rivaroxaban (XARELTO) 20 MG TABS tablet Take 1 tablet (20 mg total) by mouth daily with supper. 60 tablet 6  . spironolactone (ALDACTONE) 50 MG tablet Take 1 tablet (50 mg total) by mouth daily. 30 tablet 3  . torsemide (DEMADEX) 100 MG tablet Take 1 tablet (100 mg total) by mouth 2 (two) times daily. 180 tablet 3  . TRUEplus Lancets 28G MISC Use as instructed. Check blood glucose level by fingerstick twice per day. 200 each 3   No current facility-administered medications for this encounter.    No Known Allergies    Social History   Socioeconomic History  . Marital status: Single    Spouse name: Not on file  . Number of children:  Not on file  . Years of education: Not on file  . Highest education level: Not on file  Occupational History  . Not on file  Tobacco Use  . Smoking status: Former Research scientist (life sciences)  . Smokeless tobacco: Never Used  Substance and Sexual Activity  . Alcohol use: Not Currently  . Drug use: Not Currently  . Sexual activity: Not Currently  Other Topics Concern  . Not on file  Social History Narrative  . Not on file   Social Determinants of Health   Financial Resource Strain: Medium Risk  . Difficulty of Paying Living Expenses: Somewhat hard  Food Insecurity: Food Insecurity Present  . Worried About Charity fundraiser in the Last Year: Sometimes true  . Ran Out of Food in the Last Year: Sometimes true  Transportation Needs: No Transportation Needs  . Lack of Transportation (Medical): No  . Lack of Transportation (Non-Medical): No  Physical Activity:   . Days of Exercise per Week:   . Minutes of Exercise per Session:   Stress:   . Feeling of Stress :   Social Connections:   . Frequency of Communication with Friends and Family:   . Frequency of Social Gatherings with Friends and Family:   . Attends Religious Services:   . Active Member of Clubs or Organizations:   . Attends  Archivist Meetings:   Marland Kitchen Marital Status:   Intimate Partner Violence:   . Fear of Current or Ex-Partner:   . Emotionally Abused:   Marland Kitchen Physically Abused:   . Sexually Abused:       Family History  Problem Relation Age of Onset  . Hypertension Mother   . Diabetes Mother     Vitals:   01/20/20 1534  BP: (!) 156/92  Pulse: 93  SpO2: 96%  Weight: (!) 183 kg (403 lb 6.4 oz)   Wt Readings from Last 3 Encounters:  01/20/20 (!) 183 kg (403 lb 6.4 oz)  01/17/20 (!) 179.6 kg (396 lb)  01/13/20 (!) 174.2 kg (384 lb)     PHYSICAL EXAM: General:  Walked in the clinic with some shortness of breath.  HEENT: normal Neck: supple. Hard to assess JVD. Carotids 2+ bilat; no bruits. No lymphadenopathy or thryomegaly appreciated. Cor: PMI nondisplaced. Regular rate & rhythm. No rubs, gallops or murmurs. Lungs: clear Abdomen: obese, soft, nontender, nondistended. No hepatosplenomegaly. No bruits or masses. Good bowel sounds. Extremities: no cyanosis, clubbing, rash, R and LLE trace-1+ edema Neuro: alert & orientedx3, cranial nerves grossly intact. moves all 4 extremities w/o difficulty. Affect pleasant    ASSESSMENT & PLAN:  1. Chronic diastolic CHF: Echo in 65/46 with EF 65-70%, RV interpreted as normal but suspect significant RV dysfunction.  -NYHA III. Volume status elevated but not a surprise given he has not had his medications.  - Continue torsemide 100 mg bid and metolazone 2.5 mg three times a week.  -Continue current dose of potassium  -Continue spironolactone to 50 mg daily.  2. Atrial fibrillation: Paroxysmal.  - Regular on exam No bleeding issues.   - Continue Xarelto.  3. H/o PE: 5/20, continue Xarelto.  4. OHS/OSA: Continue CPAP at night and oxygen during the day. - Reinforced daily use.  5. HTN: Elevated but has not had meds.  6. "Falling out" spells:  Resolved using CPAP.  Zio Patch  No  arrhythmias  7. Obesity Body mass index is 56.26 kg/m.  Discussed  portion control.   Reinforced daily medication and low salt diet.  Follow up in 4 weeks. Continue HF Paramedcine.  Shalin Vonbargen NP-C  01/20/2020 3:41 PM

## 2020-01-21 ENCOUNTER — Telehealth (HOSPITAL_COMMUNITY): Payer: Self-pay | Admitting: Licensed Clinical Social Worker

## 2020-01-21 NOTE — Telephone Encounter (Signed)
CSW set up ride through [place] [employer] to get pt to second vaccine appt next week  Will continue to follow and assist as needed  [name], LCSW Clinical Social Worker [facility] Clinic Desk#: [phone] Cell#: [phone]

## 2020-01-22 ENCOUNTER — Telehealth (HOSPITAL_COMMUNITY): Payer: Self-pay | Admitting: *Deleted

## 2020-01-22 NOTE — Telephone Encounter (Signed)
Called to check in on Benjamin [name] since he has been unable to attend the PREP program. He says his appointments have interfered with PREP class times. He says his clothes have been falling off because of his weight loss and the HF CSW is working with him for clothing. He is enjoying PREP and hopes to get to the [facility] another day this week. he will request transportation tomorrow once he arrives at the [facility]. Will discuss with Viann Fish.    Lesia Hausen, MS, ACSM, [facility] Clinical Exercise Physiologist/ Health and Wellness Coach

## 2020-01-23 ENCOUNTER — Other Ambulatory Visit (HOSPITAL_COMMUNITY): Payer: Self-pay
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
## 2020-01-27 ENCOUNTER — Other Ambulatory Visit (HOSPITAL_COMMUNITY): Payer: Self-pay

## 2020-01-27 ENCOUNTER — Telehealth (HOSPITAL_COMMUNITY): Payer: Self-pay | Admitting: Licensed Clinical Social Worker

## 2020-01-27 NOTE — Progress Notes (Signed)
Paramedicine Encounter    Patient ID: Benjamin [name], male    DOB: 1966/05/27, 54 y.o.   MRN: [id number]   Patient Care Team: [name], NP as PCP - General (Nurse Practitioner) [name], MD as PCP - Cardiology (Cardiology) [name], LCSW as Social Worker (Licensed Clinical Social Worker)  Patient Active Problem List   Diagnosis Date Noted  . Diabetic neuropathy ([place]) 11/20/2019  . Coagulation disorder ([place]) 11/20/2019  . Acute on chronic diastolic CHF (congestive heart failure), NYHA class 3 ([place]) 07/30/2019  . Diabetes mellitus without complication ([place]) 15/83/0940  . Hypertensive emergency   . BRBPR (bright red blood per rectum) 06/12/2019  . Hypertension   . CHF (congestive heart failure) ([place])   . Sleep apnea   . Chronic respiratory failure ([place])   . Hypokalemia 05/29/2019  . GI bleed 05/29/2019  . Anemia 05/28/2019  . Acute on chronic diastolic CHF (congestive heart failure) ([place]) 04/11/2019  . Iron deficiency anemia 03/11/2019  . OSA on CPAP 03/11/2019  . HLD (hyperlipidemia) 03/11/2019  . Elevated troponin 03/11/2019  . GERD (gastroesophageal reflux disease) 03/11/2019  . Rectal bleeding 02/20/2019  . Dyspnea 02/06/2019  . COVID-19 virus infection 02/06/2019  . Bilateral lower extremity edema   . Morbid obesity with BMI of 50.0-59.9, adult ([place])   . Chronic diastolic CHF (congestive heart failure) ([place])   . PAF (paroxysmal atrial fibrillation) ([place])   . PE (pulmonary thromboembolism) ([place]) 01/21/2019  . Hypertensive urgency 01/21/2019  . Diabetes mellitus type 2 in obese ([place]) 01/21/2019    Current Outpatient Medications:  .  albuterol (VENTOLIN HFA) 108 (90 Base) MCG/ACT inhaler, Inhale 2 puffs into the lungs every 6 (six) hours as needed for wheezing or shortness of breath., Disp: 8.5 g, Rfl: 2 .  amLODipine (NORVASC) 10 MG tablet, Take 1 tablet (10 mg total) by mouth daily., Disp: 90 tablet, Rfl: 1 .  atorvastatin (LIPITOR) 40 MG tablet,  Take 1 tablet (40 mg total) by mouth at bedtime., Disp: 90 tablet, Rfl: 2 .  Blood Glucose Monitoring Suppl (TRUE METRIX METER) w/Device KIT, Use as instructed. Check blood glucose level by fingerstick twice per day., Disp: 1 kit, Rfl: 0 .  Blood Pressure Monitor DEVI, Please provide patient with insurance approved blood pressure device with L-XL cuff. BMI 55, Disp: 1 each, Rfl: 0 .  carboxymethylcellulose (REFRESH PLUS) 0.5 % SOLN, Place 1 drop into both eyes daily. , Disp: , Rfl:  .  docusate sodium (COLACE) 100 MG capsule, Take 100 mg by mouth 2 (two) times daily., Disp: , Rfl:  .  ferrous sulfate 325 (65 FE) MG tablet, Take 1 tablet (325 mg total) by mouth daily. Patient will pick up scripts today., Disp: 90 tablet, Rfl: 0 .  glucose blood (TRUE METRIX BLOOD GLUCOSE TEST) test strip, Use as instructed. Check blood glucose level by fingerstick twice per day., Disp: 100 each, Rfl: 12 .  labetalol (NORMODYNE) 200 MG tablet, Take 2 tablets (400 mg total) by mouth 2 (two) times daily., Disp: 120 tablet, Rfl: 3 .  losartan (COZAAR) 100 MG tablet, Take 1 tablet (100 mg total) by mouth daily. (Patient taking differently: Take 100 mg by mouth at bedtime. ), Disp: 30 tablet, Rfl: 5 .  magnesium oxide (MAG-OX) 400 MG tablet, Take 2 tablets (800 mg total) by mouth every morning AND 1 tablet (400 mg total) every evening., Disp: 90 tablet, Rfl: 6 .  metFORMIN (GLUCOPHAGE) 500 MG tablet, Take 1 tablet (500  mg total) by mouth 2 (two) times daily with a meal. Patient will pick up scripts today., Disp: 180 tablet, Rfl: 1 .  metolazone (ZAROXOLYN) 5 MG tablet, TAKE 1 TABLET EVERY MONDAY, WEDNESDAY, AND FRIDAY., Disp: 15 tablet, Rfl: 3 .  Misc. Devices MISC, Requires O2 @ 3L/min continuously via nasal canula and home fill system,., Disp: 1 each, Rfl: 0 .  Misc. Devices MISC, Please provide Benjamin [name] with insurance approved portable O2 concentrator ICD 10 J96.11 Z99.81, Disp: 1 each, Rfl: 0 .  omeprazole (PRILOSEC)  20 MG capsule, Take 1 capsule (20 mg total) by mouth 2 (two) times daily before a meal. Take first capsule 30 min prior to eating or taking other medications., Disp: 60 capsule, Rfl: 2 .  potassium chloride SA (KLOR-CON) 10 MEQ tablet, Take 60 meq (6 tabs) in the morning, 60 meq (6 tabs) in the afternoon and 40 meq (4 tabs) in the evening.  Take an extra 4 tabs every dose of metolazone, Disp: 540 tablet, Rfl: 3 .  rivaroxaban (XARELTO) 20 MG TABS tablet, Take 1 tablet (20 mg total) by mouth daily with supper., Disp: 60 tablet, Rfl: 6 .  spironolactone (ALDACTONE) 50 MG tablet, Take 1 tablet (50 mg total) by mouth daily., Disp: 30 tablet, Rfl: 3 .  torsemide (DEMADEX) 100 MG tablet, Take 1 tablet (100 mg total) by mouth 2 (two) times daily., Disp: 180 tablet, Rfl: 3 .  TRUEplus Lancets 28G MISC, Use as instructed. Check blood glucose level by fingerstick twice per day., Disp: 200 each, Rfl: 3 .  levalbuterol (XOPENEX HFA) 45 MCG/ACT inhaler, Inhale 2 puffs into the lungs every 4 (four) hours as needed for wheezing. , Disp: , Rfl:  No Known Allergies    Social History   Socioeconomic History  . Marital status: Single    Spouse name: Not on file  . Number of children: Not on file  . Years of education: Not on file  . Highest education level: Not on file  Occupational History  . Not on file  Tobacco Use  . Smoking status: Former Research scientist (life sciences)  . Smokeless tobacco: Never Used  Substance and Sexual Activity  . Alcohol use: Not Currently  . Drug use: Not Currently  . Sexual activity: Not Currently  Other Topics Concern  . Not on file  Social History Narrative  . Not on file   Social Determinants of Health   Financial Resource Strain: Medium Risk  . Difficulty of Paying Living Expenses: Somewhat hard  Food Insecurity: Food Insecurity Present  . Worried About Charity fundraiser in the Last Year: Sometimes true  . Ran Out of Food in the Last Year: Sometimes true  Transportation Needs: No  Transportation Needs  . Lack of Transportation (Medical): No  . Lack of Transportation (Non-Medical): No  Physical Activity:   . Days of Exercise per Week:   . Minutes of Exercise per Session:   Stress:   . Feeling of Stress :   Social Connections:   . Frequency of Communication with Friends and Family:   . Frequency of Social Gatherings with Friends and Family:   . Attends Religious Services:   . Active Member of Clubs or Organizations:   . Attends Archivist Meetings:   Marland Kitchen Marital Status:   Intimate Partner Violence:   . Fear of Current or Ex-Partner:   . Emotionally Abused:   Marland Kitchen Physically Abused:   . Sexually Abused:     Physical Exam  Future Appointments  Date Time Provider [place]  01/29/2020  9:00 AM [place] [facility]  02/20/2020 10:30 AM [facility] PA/NP [facility] None  04/17/2020 11:10 AM [name], NP [facility] None    BP (!) 178/0   Pulse 86   Temp 98.7 F (37.1 C)   Resp 18   Wt (!) 386 lb (175.1 kg)   SpO2 96%   BMI 53.84 kg/m   Weight yesterday-387 Last visit weight-400 @ clinic   Pt reports he woke up with h/a but he ate something and it went away.  His b/p is up--he has not taken his meds yet this morning.  Weight is back down.  He has missed several doses of his meds this past week.  He said one day he stayed in bed all day. He states he had minor bleeding yesterday during a hard BM and he was straining,but no more after that.  meds verified and pill box refilled.  --omeprazole, amlodipine, magnesium, metolazone, xarelto,   -omeprazole is only for once a day for now until we get it refilled.   [name], [facility] Paramedic  01/28/20

## 2020-01-27 NOTE — Telephone Encounter (Signed)
CSW had met pt in the clinic last week and had expressed frustration with lack of clothing.  Only has 4 pairs of boxers and few shirts and pants- because of how little clothes he has he has to go to the laundry mat often which cost him a lot of money to pay for transport back and forth.  CSW able to purchase pt a pair of shorts, tshirts, and underwear through patient care fund to help[employer] dropped off to patient today and he was very Patent attorney.  CSW will continue to follow and assist as needed  [name], [facility] Desk#: [phone] Cell#: [phone]

## 2020-01-29 ENCOUNTER — Ambulatory Visit: Payer: Medicaid Other | Attending: Internal Medicine

## 2020-01-29 DIAGNOSIS — Z23 Encounter for immunization: Secondary | ICD-10-CM

## 2020-01-29 NOTE — Progress Notes (Signed)
   Covid-19 Vaccination Clinic  Name:  Benjamin [name]    MRN: [id number] DOB: Dec 22, 1965  01/29/2020  [name] was observed post Covid-19 immunization for 15 minutes without incident. He was provided with Vaccine Information Sheet and instruction to access the V-Safe system.   [name] was instructed to call 911 with any severe reactions post vaccine: Marland Kitchen Difficulty breathing  . Swelling of face and throat  . A fast heartbeat  . A bad rash all over body  . Dizziness and weakness   Immunizations Administered    Name Date Dose VIS Date Route   Pfizer COVID-19 Vaccine 01/29/2020  8:40 AM 0.3 mL 11/13/2018 Intramuscular   Manufacturer: [employer], [employer]   Lot: [id number]   NDC: [address]-7      Covid-19 Vaccination Clinic  Name:  Benjamin [name]    MRN: [id number] DOB: 1966-01-01  01/29/2020  [name] was observed post Covid-19 immunization for 15 minutes without incident. He was provided with Vaccine Information Sheet and instruction to access the V-Safe system.   [name] was instructed to call 911 with any severe reactions post vaccine: Marland Kitchen Difficulty breathing  . Swelling of face and throat  . A fast heartbeat  . A bad rash all over body  . Dizziness and weakness   Immunizations Administered    Name Date Dose VIS Date Route   Pfizer COVID-19 Vaccine 01/29/2020  8:40 AM 0.3 mL 11/13/2018 Intramuscular   Manufacturer: [employer], [employer]   Lot: [id number]   NDC: [address]-6

## 2020-02-03 ENCOUNTER — Other Ambulatory Visit (HOSPITAL_COMMUNITY): Payer: Self-pay

## 2020-02-03 NOTE — Progress Notes (Signed)
Paramedicine Encounter    Patient ID: Benjamin [name], male    DOB: [birth year], 54 y.o.   MRN: [id number]   Patient Care Team: [name], NP as PCP - General (Nurse Practitioner) [name], MD as PCP - Cardiology (Cardiology) [name], LCSW as Social Worker (Licensed Clinical Social Worker)  Patient Active Problem List   Diagnosis Date Noted  . Diabetic neuropathy ([place]) 11/20/2019  . Coagulation disorder ([place]) 11/20/2019  . Acute on chronic diastolic CHF (congestive heart failure), NYHA class 3 ([place]) 07/30/2019  . Diabetes mellitus without complication ([place]) 56/31/4970  . Hypertensive emergency   . BRBPR (bright red blood per rectum) 06/12/2019  . Hypertension   . CHF (congestive heart failure) ([place])   . Sleep apnea   . Chronic respiratory failure ([place])   . Hypokalemia 05/29/2019  . GI bleed 05/29/2019  . Anemia 05/28/2019  . Acute on chronic diastolic CHF (congestive heart failure) ([place]) 04/11/2019  . Iron deficiency anemia 03/11/2019  . OSA on CPAP 03/11/2019  . HLD (hyperlipidemia) 03/11/2019  . Elevated troponin 03/11/2019  . GERD (gastroesophageal reflux disease) 03/11/2019  . Rectal bleeding 02/20/2019  . Dyspnea 02/06/2019  . COVID-19 virus infection 02/06/2019  . Bilateral lower extremity edema   . Morbid obesity with BMI of 50.0-59.9, adult ([place])   . Chronic diastolic CHF (congestive heart failure) ([place])   . PAF (paroxysmal atrial fibrillation) ([place])   . PE (pulmonary thromboembolism) ([place]) 01/21/2019  . Hypertensive urgency 01/21/2019  . Diabetes mellitus type 2 in obese ([place]) 01/21/2019    Current Outpatient Medications:  .  albuterol (VENTOLIN HFA) 108 (90 Base) MCG/ACT inhaler, Inhale 2 puffs into the lungs every 6 (six) hours as needed for wheezing or shortness of breath., Disp: 8.5 g, Rfl: 2 .  amLODipine (NORVASC) 10 MG tablet, Take 1 tablet (10 mg total) by mouth daily., Disp: 90 tablet, Rfl: 1 .  atorvastatin (LIPITOR) 40 MG tablet,  Take 1 tablet (40 mg total) by mouth at bedtime., Disp: 90 tablet, Rfl: 2 .  Blood Glucose Monitoring Suppl (TRUE METRIX METER) w/Device KIT, Use as instructed. Check blood glucose level by fingerstick twice per day., Disp: 1 kit, Rfl: 0 .  Blood Pressure Monitor DEVI, Please provide patient with insurance approved blood pressure device with L-XL cuff. BMI 55, Disp: 1 each, Rfl: 0 .  carboxymethylcellulose (REFRESH PLUS) 0.5 % SOLN, Place 1 drop into both eyes daily. , Disp: , Rfl:  .  docusate sodium (COLACE) 100 MG capsule, Take 100 mg by mouth 2 (two) times daily., Disp: , Rfl:  .  ferrous sulfate 325 (65 FE) MG tablet, Take 1 tablet (325 mg total) by mouth daily. Patient will pick up scripts today., Disp: 90 tablet, Rfl: 0 .  glucose blood (TRUE METRIX BLOOD GLUCOSE TEST) test strip, Use as instructed. Check blood glucose level by fingerstick twice per day., Disp: 100 each, Rfl: 12 .  labetalol (NORMODYNE) 200 MG tablet, Take 2 tablets (400 mg total) by mouth 2 (two) times daily., Disp: 120 tablet, Rfl: 3 .  losartan (COZAAR) 100 MG tablet, Take 1 tablet (100 mg total) by mouth daily. (Patient taking differently: Take 100 mg by mouth at bedtime. ), Disp: 30 tablet, Rfl: 5 .  magnesium oxide (MAG-OX) 400 MG tablet, Take 2 tablets (800 mg total) by mouth every morning AND 1 tablet (400 mg total) every evening., Disp: 90 tablet, Rfl: 6 .  metFORMIN (GLUCOPHAGE) 500 MG tablet, Take 1 tablet (500  mg total) by mouth 2 (two) times daily with a meal. Patient will pick up scripts today., Disp: 180 tablet, Rfl: 1 .  metolazone (ZAROXOLYN) 5 MG tablet, TAKE 1 TABLET EVERY MONDAY, WEDNESDAY, AND FRIDAY., Disp: 15 tablet, Rfl: 3 .  Misc. Devices MISC, Requires O2 @ 3L/min continuously via nasal canula and home fill system,., Disp: 1 each, Rfl: 0 .  Misc. Devices MISC, Please provide [name] with insurance approved portable O2 concentrator ICD 10 J96.11 Z99.81, Disp: 1 each, Rfl: 0 .  potassium chloride SA  (KLOR-CON) 10 MEQ tablet, Take 60 meq (6 tabs) in the morning, 60 meq (6 tabs) in the afternoon and 40 meq (4 tabs) in the evening.  Take an extra 4 tabs every dose of metolazone, Disp: 540 tablet, Rfl: 3 .  rivaroxaban (XARELTO) 20 MG TABS tablet, Take 1 tablet (20 mg total) by mouth daily with supper., Disp: 60 tablet, Rfl: 6 .  spironolactone (ALDACTONE) 50 MG tablet, Take 1 tablet (50 mg total) by mouth daily., Disp: 30 tablet, Rfl: 3 .  torsemide (DEMADEX) 100 MG tablet, Take 1 tablet (100 mg total) by mouth 2 (two) times daily., Disp: 180 tablet, Rfl: 3 .  TRUEplus Lancets 28G MISC, Use as instructed. Check blood glucose level by fingerstick twice per day., Disp: 200 each, Rfl: 3 .  levalbuterol (XOPENEX HFA) 45 MCG/ACT inhaler, Inhale 2 puffs into the lungs every 4 (four) hours as needed for wheezing. , Disp: , Rfl:  .  omeprazole (PRILOSEC) 20 MG capsule, Take 1 capsule (20 mg total) by mouth 2 (two) times daily before a meal. Take first capsule 30 min prior to eating or taking other medications., Disp: 60 capsule, Rfl: 2 No Known Allergies    Social History   Socioeconomic History  . Marital status: Single    Spouse name: Not on file  . Number of children: Not on file  . Years of education: Not on file  . Highest education level: Not on file  Occupational History  . Not on file  Tobacco Use  . Smoking status: Former Research scientist (life sciences)  . Smokeless tobacco: Never Used  Substance and Sexual Activity  . Alcohol use: Not Currently  . Drug use: Not Currently  . Sexual activity: Not Currently  Other Topics Concern  . Not on file  Social History Narrative  . Not on file   Social Determinants of Health   Financial Resource Strain: Medium Risk  . Difficulty of Paying Living Expenses: Somewhat hard  Food Insecurity: Food Insecurity Present  . Worried About Charity fundraiser in the Last Year: Sometimes true  . Ran Out of Food in the Last Year: Sometimes true  Transportation Needs: No  Transportation Needs  . Lack of Transportation (Medical): No  . Lack of Transportation (Non-Medical): No  Physical Activity:   . Days of Exercise per Week:   . Minutes of Exercise per Session:   Stress:   . Feeling of Stress :   Social Connections:   . Frequency of Communication with Friends and Family:   . Frequency of Social Gatherings with Friends and Family:   . Attends Religious Services:   . Active Member of Clubs or Organizations:   . Attends Archivist Meetings:   Marland Kitchen Marital Status:   Intimate Partner Violence:   . Fear of Current or Ex-Partner:   . Emotionally Abused:   Marland Kitchen Physically Abused:   . Sexually Abused:     Physical Exam  Future Appointments  Date Time Provider [place]  02/20/2020 10:30 AM [facility] PA/NP [facility] None  04/17/2020 11:10 AM [name], NP [facility] None    BP (!) 154/0   Pulse 84   Temp 98.7 F (37.1 C)   Resp 20   Wt (!) 389 lb (176.4 kg)   SpO2 94%   BMI 54.25 kg/m   Weight yesterday-389 Last visit weight-386  I p/u pts meds from pharmacy prior to visit and took them to pt.  Pt reports he is doing good. He had to go see his PO this morning and he said everything was fine with that visit.  He denies any drama in the house this past week. The ones that caused trouble last week have been evicted out of the house.  He missed one day dose of meds.  Pt denies any c/p, no sob, he reports his weight got up to 410lbs over the wknd but he had drank a lot of fluids at that time and he did miss a days of meds.  His weight is back down now.  The new potassium strength/pills are working out much better. Much easier for him to take and easier on his stomach.   CHW sent him papers in the mail with list of eye doctors he could choose from so he is going to call to sch appointment and sch his transportation there as well.   -needs spiro and labetalol for next week.   [name], [facility] Paramedic  02/03/20

## 2020-02-04 NOTE — Progress Notes (Signed)
[facility] PREP Weekly Session   Patient Details  Name: Benjamin [name] MRN: [id number] Date of Birth: Aug 06, 1966 Age: 54 y.o. PCP: Claiborne Rigg, NP  Vitals:    [facility] Weekly seesion - 02/04/20 1600      Weekly Session   Topic Discussed  Calorie breakdown    Classes attended to date  12      No exercise minutes noted but did attend Monday at gym as well this week Fun things since last meeting: walking Grateful for back not hurting   [name] 02/04/2020, 4:38 PM

## 2020-02-09 DIAGNOSIS — I5032 Chronic diastolic (congestive) heart failure: Secondary | ICD-10-CM | POA: Diagnosis not present

## 2020-02-09 DIAGNOSIS — J9611 Chronic respiratory failure with hypoxia: Secondary | ICD-10-CM | POA: Diagnosis not present

## 2020-02-10 ENCOUNTER — Other Ambulatory Visit (HOSPITAL_COMMUNITY): Payer: Self-pay

## 2020-02-10 NOTE — Progress Notes (Signed)
Paramedicine Encounter    Patient ID: Benjamin [name], male    DOB: [birth year], 54 y.o.   MRN: [id number]   Patient Care Team: Benjamin Pounds, NP as PCP - General (Nurse Practitioner) Benjamin Records, MD as PCP - Cardiology (Cardiology) Benjamin Ny, LCSW as Social Worker (Licensed Clinical Social Worker)  Patient Active Problem List   Diagnosis Date Noted  . Diabetic neuropathy ([place]) 11/20/2019  . Coagulation disorder ([place]) 11/20/2019  . Acute on chronic diastolic CHF (congestive heart failure), NYHA class 3 ([place]) 07/30/2019  . Diabetes mellitus without complication ([place]) 22/97/9892  . Hypertensive emergency   . BRBPR (bright red blood per rectum) 06/12/2019  . Hypertension   . CHF (congestive heart failure) ([place])   . Sleep apnea   . Chronic respiratory failure ([place])   . Hypokalemia 05/29/2019  . GI bleed 05/29/2019  . Anemia 05/28/2019  . Acute on chronic diastolic CHF (congestive heart failure) ([place]) 04/11/2019  . Iron deficiency anemia 03/11/2019  . OSA on CPAP 03/11/2019  . HLD (hyperlipidemia) 03/11/2019  . Elevated troponin 03/11/2019  . GERD (gastroesophageal reflux disease) 03/11/2019  . Rectal bleeding 02/20/2019  . Dyspnea 02/06/2019  . COVID-19 virus infection 02/06/2019  . Bilateral lower extremity edema   . Morbid obesity with BMI of 50.0-59.9, adult ([place])   . Chronic diastolic CHF (congestive heart failure) ([place])   . PAF (paroxysmal atrial fibrillation) ([place])   . PE (pulmonary thromboembolism) ([place]) 01/21/2019  . Hypertensive urgency 01/21/2019  . Diabetes mellitus type 2 in obese ([place]) 01/21/2019    Current Outpatient Medications:  .  albuterol (VENTOLIN HFA) 108 (90 Base) MCG/ACT inhaler, Inhale 2 puffs into the lungs every 6 (six) hours as needed for wheezing or shortness of breath., Disp: 8.5 g, Rfl: 2 .  amLODipine (NORVASC) 10 MG tablet, Take 1 tablet (10 mg total) by mouth daily., Disp: 90 tablet, Rfl: 1 .  atorvastatin (LIPITOR) 40 MG tablet,  Take 1 tablet (40 mg total) by mouth at bedtime., Disp: 90 tablet, Rfl: 2 .  Blood Glucose Monitoring Suppl (TRUE METRIX METER) w/Device KIT, Use as instructed. Check blood glucose level by fingerstick twice per day., Disp: 1 kit, Rfl: 0 .  Blood Pressure Monitor DEVI, Please provide patient with insurance approved blood pressure device with L-XL cuff. BMI 55, Disp: 1 each, Rfl: 0 .  carboxymethylcellulose (REFRESH PLUS) 0.5 % SOLN, Place 1 drop into both eyes daily. , Disp: , Rfl:  .  docusate sodium (COLACE) 100 MG capsule, Take 100 mg by mouth 2 (two) times daily., Disp: , Rfl:  .  ferrous sulfate 325 (65 FE) MG tablet, Take 1 tablet (325 mg total) by mouth daily. Patient will pick up scripts today., Disp: 90 tablet, Rfl: 0 .  glucose blood (TRUE METRIX BLOOD GLUCOSE TEST) test strip, Use as instructed. Check blood glucose level by fingerstick twice per day., Disp: 100 each, Rfl: 12 .  labetalol (NORMODYNE) 200 MG tablet, Take 2 tablets (400 mg total) by mouth 2 (two) times daily., Disp: 120 tablet, Rfl: 3 .  losartan (COZAAR) 100 MG tablet, Take 1 tablet (100 mg total) by mouth daily. (Patient taking differently: Take 100 mg by mouth at bedtime. ), Disp: 30 tablet, Rfl: 5 .  magnesium oxide (MAG-OX) 400 MG tablet, Take 2 tablets (800 mg total) by mouth every morning AND 1 tablet (400 mg total) every evening., Disp: 90 tablet, Rfl: 6 .  metFORMIN (GLUCOPHAGE) 500 MG tablet, Take 1 tablet (500  mg total) by mouth 2 (two) times daily with a meal. Patient will pick up scripts today., Disp: 180 tablet, Rfl: 1 .  metolazone (ZAROXOLYN) 5 MG tablet, TAKE 1 TABLET EVERY MONDAY, WEDNESDAY, AND FRIDAY., Disp: 15 tablet, Rfl: 3 .  Misc. Devices MISC, Requires O2 @ 3L/min continuously via nasal canula and home fill system,., Disp: 1 each, Rfl: 0 .  Misc. Devices MISC, Please provide Benjamin [name] with insurance approved portable O2 concentrator ICD 10 J96.11 Z99.81, Disp: 1 each, Rfl: 0 .  potassium chloride SA  (KLOR-CON) 10 MEQ tablet, Take 60 meq (6 tabs) in the morning, 60 meq (6 tabs) in the afternoon and 40 meq (4 tabs) in the evening.  Take an extra 4 tabs every dose of metolazone, Disp: 540 tablet, Rfl: 3 .  rivaroxaban (XARELTO) 20 MG TABS tablet, Take 1 tablet (20 mg total) by mouth daily with supper., Disp: 60 tablet, Rfl: 6 .  spironolactone (ALDACTONE) 50 MG tablet, Take 1 tablet (50 mg total) by mouth daily., Disp: 30 tablet, Rfl: 3 .  torsemide (DEMADEX) 100 MG tablet, Take 1 tablet (100 mg total) by mouth 2 (two) times daily., Disp: 180 tablet, Rfl: 3 .  TRUEplus Lancets 28G MISC, Use as instructed. Check blood glucose level by fingerstick twice per day., Disp: 200 each, Rfl: 3 .  levalbuterol (XOPENEX HFA) 45 MCG/ACT inhaler, Inhale 2 puffs into the lungs every 4 (four) hours as needed for wheezing. , Disp: , Rfl:  .  omeprazole (PRILOSEC) 20 MG capsule, Take 1 capsule (20 mg total) by mouth 2 (two) times daily before a meal. Take first capsule 30 min prior to eating or taking other medications., Disp: 60 capsule, Rfl: 2 No Known Allergies    Social History   Socioeconomic History  . Marital status: Single    Spouse name: Not on file  . Number of children: Not on file  . Years of education: Not on file  . Highest education level: Not on file  Occupational History  . Not on file  Tobacco Use  . Smoking status: Former Research scientist (life sciences)  . Smokeless tobacco: Never Used  Substance and Sexual Activity  . Alcohol use: Not Currently  . Drug use: Not Currently  . Sexual activity: Not Currently  Other Topics Concern  . Not on file  Social History Narrative  . Not on file   Social Determinants of Health   Financial Resource Strain: Medium Risk  . Difficulty of Paying Living Expenses: Somewhat hard  Food Insecurity: Food Insecurity Present  . Worried About Charity fundraiser in the Last Year: Sometimes true  . Ran Out of Food in the Last Year: Sometimes true  Transportation Needs: No  Transportation Needs  . Lack of Transportation (Medical): No  . Lack of Transportation (Non-Medical): No  Physical Activity:   . Days of Exercise per Week:   . Minutes of Exercise per Session:   Stress:   . Feeling of Stress :   Social Connections:   . Frequency of Communication with Friends and Family:   . Frequency of Social Gatherings with Friends and Family:   . Attends Religious Services:   . Active Member of Clubs or Organizations:   . Attends Archivist Meetings:   Marland Kitchen Marital Status:   Intimate Partner Violence:   . Fear of Current or Ex-Partner:   . Emotionally Abused:   Marland Kitchen Physically Abused:   . Sexually Abused:     Physical Exam  Future Appointments  Date Time Provider [place]  02/20/2020 10:30 AM [facility] PA/NP [facility] None  04/17/2020 11:10 AM Benjamin Pounds, NP [facility] None    BP (!) 194/0   Pulse 88   Temp 98.4 F (36.9 C)   Resp 20   Wt (!) 400 lb (181.4 kg)   SpO2 96%   BMI 55.79 kg/m   Weight yesterday-394 Last visit weight-389  Pt reports he is doing good, his weight is up though--he has started drinking sports drinks- I advised him to not drink those anymore due to high sodium levels. He does have swelling to his legs. So we figured out why with the sports drinks.  --asking about getting another urinal meds verified and pill box refilled.  He did miss one day of meds this week.  He denies sob, no dizziness, no c/p,  He is going to gym this afternoon, states they are getting him a membership to gym.   [name], [place] [facility] Paramedic  02/10/20

## 2020-02-17 ENCOUNTER — Other Ambulatory Visit (HOSPITAL_COMMUNITY): Payer: Self-pay

## 2020-02-17 NOTE — Progress Notes (Signed)
Paramedicine Encounter    Patient ID: Benjamin [name], male    DOB: 1966/04/01, 54 y.o.   MRN: [id number]   Patient Care Team: Benjamin Pounds, NP as PCP - General (Nurse Practitioner) Benjamin Records, MD as PCP - Cardiology (Cardiology) Benjamin Ny, LCSW as Social Worker (Licensed Clinical Social Worker)  Patient Active Problem List   Diagnosis Date Noted  . Diabetic neuropathy ([place]) 11/20/2019  . Coagulation disorder ([place]) 11/20/2019  . Acute on chronic diastolic CHF (congestive heart failure), NYHA class 3 ([place]) 07/30/2019  . Diabetes mellitus without complication ([place]) 42/68/3419  . Hypertensive emergency   . BRBPR (bright red blood per rectum) 06/12/2019  . Hypertension   . CHF (congestive heart failure) ([place])   . Sleep apnea   . Chronic respiratory failure ([place])   . Hypokalemia 05/29/2019  . GI bleed 05/29/2019  . Anemia 05/28/2019  . Acute on chronic diastolic CHF (congestive heart failure) ([place]) 04/11/2019  . Iron deficiency anemia 03/11/2019  . OSA on CPAP 03/11/2019  . HLD (hyperlipidemia) 03/11/2019  . Elevated troponin 03/11/2019  . GERD (gastroesophageal reflux disease) 03/11/2019  . Rectal bleeding 02/20/2019  . Dyspnea 02/06/2019  . COVID-19 virus infection 02/06/2019  . Bilateral lower extremity edema   . Morbid obesity with BMI of 50.0-59.9, adult ([place])   . Chronic diastolic CHF (congestive heart failure) ([place])   . PAF (paroxysmal atrial fibrillation) ([place])   . PE (pulmonary thromboembolism) ([place]) 01/21/2019  . Hypertensive urgency 01/21/2019  . Diabetes mellitus type 2 in obese ([place]) 01/21/2019    Current Outpatient Medications:  .  albuterol (VENTOLIN HFA) 108 (90 Base) MCG/ACT inhaler, Inhale 2 puffs into the lungs every 6 (six) hours as needed for wheezing or shortness of breath., Disp: 8.5 g, Rfl: 2 .  amLODipine (NORVASC) 10 MG tablet, Take 1 tablet (10 mg total) by mouth daily., Disp: 90 tablet, Rfl: 1 .  atorvastatin (LIPITOR) 40 MG tablet,  Take 1 tablet (40 mg total) by mouth at bedtime., Disp: 90 tablet, Rfl: 2 .  Blood Glucose Monitoring Suppl (TRUE METRIX METER) w/Device KIT, Use as instructed. Check blood glucose level by fingerstick twice per day., Disp: 1 kit, Rfl: 0 .  Blood Pressure Monitor DEVI, Please provide patient with insurance approved blood pressure device with L-XL cuff. BMI 55, Disp: 1 each, Rfl: 0 .  carboxymethylcellulose (REFRESH PLUS) 0.5 % SOLN, Place 1 drop into both eyes daily. , Disp: , Rfl:  .  docusate sodium (COLACE) 100 MG capsule, Take 100 mg by mouth 2 (two) times daily., Disp: , Rfl:  .  ferrous sulfate 325 (65 FE) MG tablet, Take 1 tablet (325 mg total) by mouth daily. Patient will pick up scripts today., Disp: 90 tablet, Rfl: 0 .  glucose blood (TRUE METRIX BLOOD GLUCOSE TEST) test strip, Use as instructed. Check blood glucose level by fingerstick twice per day., Disp: 100 each, Rfl: 12 .  labetalol (NORMODYNE) 200 MG tablet, Take 2 tablets (400 mg total) by mouth 2 (two) times daily., Disp: 120 tablet, Rfl: 3 .  levalbuterol (XOPENEX HFA) 45 MCG/ACT inhaler, Inhale 2 puffs into the lungs every 4 (four) hours as needed for wheezing. , Disp: , Rfl:  .  losartan (COZAAR) 100 MG tablet, Take 1 tablet (100 mg total) by mouth daily. (Patient taking differently: Take 100 mg by mouth at bedtime. ), Disp: 30 tablet, Rfl: 5 .  magnesium oxide (MAG-OX) 400 MG tablet, Take 2 tablets (800 mg total) by  mouth every morning AND 1 tablet (400 mg total) every evening., Disp: 90 tablet, Rfl: 6 .  metFORMIN (GLUCOPHAGE) 500 MG tablet, Take 1 tablet (500 mg total) by mouth 2 (two) times daily with a meal. Patient will pick up scripts today., Disp: 180 tablet, Rfl: 1 .  metolazone (ZAROXOLYN) 5 MG tablet, TAKE 1 TABLET EVERY MONDAY, WEDNESDAY, AND FRIDAY., Disp: 15 tablet, Rfl: 3 .  Misc. Devices MISC, Requires O2 @ 3L/min continuously via nasal canula and home fill system,., Disp: 1 each, Rfl: 0 .  Misc. Devices MISC,  Please provide [name] with insurance approved portable O2 concentrator ICD 10 J96.11 Z99.81, Disp: 1 each, Rfl: 0 .  omeprazole (PRILOSEC) 20 MG capsule, Take 1 capsule (20 mg total) by mouth 2 (two) times daily before a meal. Take first capsule 30 min prior to eating or taking other medications., Disp: 60 capsule, Rfl: 2 .  potassium chloride SA (KLOR-CON) 10 MEQ tablet, Take 60 meq (6 tabs) in the morning, 60 meq (6 tabs) in the afternoon and 40 meq (4 tabs) in the evening.  Take an extra 4 tabs every dose of metolazone, Disp: 540 tablet, Rfl: 3 .  rivaroxaban (XARELTO) 20 MG TABS tablet, Take 1 tablet (20 mg total) by mouth daily with supper., Disp: 60 tablet, Rfl: 6 .  spironolactone (ALDACTONE) 50 MG tablet, Take 1 tablet (50 mg total) by mouth daily., Disp: 30 tablet, Rfl: 3 .  torsemide (DEMADEX) 100 MG tablet, Take 1 tablet (100 mg total) by mouth 2 (two) times daily., Disp: 180 tablet, Rfl: 3 .  TRUEplus Lancets 28G MISC, Use as instructed. Check blood glucose level by fingerstick twice per day., Disp: 200 each, Rfl: 3 No Known Allergies    Social History   Socioeconomic History  . Marital status: Single    Spouse name: Not on file  . Number of children: Not on file  . Years of education: Not on file  . Highest education level: Not on file  Occupational History  . Not on file  Tobacco Use  . Smoking status: Former Research scientist (life sciences)  . Smokeless tobacco: Never Used  Substance and Sexual Activity  . Alcohol use: Not Currently  . Drug use: Not Currently  . Sexual activity: Not Currently  Other Topics Concern  . Not on file  Social History Narrative  . Not on file   Social Determinants of Health   Financial Resource Strain: Medium Risk  . Difficulty of Paying Living Expenses: Somewhat hard  Food Insecurity: Food Insecurity Present  . Worried About Charity fundraiser in the Last Year: Sometimes true  . Ran Out of Food in the Last Year: Sometimes true  Transportation Needs: No  Transportation Needs  . Lack of Transportation (Medical): No  . Lack of Transportation (Non-Medical): No  Physical Activity:   . Days of Exercise per Week:   . Minutes of Exercise per Session:   Stress:   . Feeling of Stress :   Social Connections:   . Frequency of Communication with Friends and Family:   . Frequency of Social Gatherings with Friends and Family:   . Attends Religious Services:   . Active Member of Clubs or Organizations:   . Attends Archivist Meetings:   Marland Kitchen Marital Status:   Intimate Partner Violence:   . Fear of Current or Ex-Partner:   . Emotionally Abused:   Marland Kitchen Physically Abused:   . Sexually Abused:     Physical Exam  Future Appointments  Date Time Provider [place]  02/20/2020 10:30 AM [facility] PA/NP [facility] None  04/17/2020 11:10 AM Benjamin Pounds, NP [facility] None    BP (!) 184/0   Pulse 88   Temp 99.1 F (37.3 C)   Resp 20   Wt (!) 400 lb (181.4 kg)   SpO2 97%   BMI 55.79 kg/m   Weight yesterday-400 Last visit weight-400  Pt reports doing ok. He has been at his cousins in [place] this morning, he came back to meet me this morning to fill pill box.  He states they are having a big cookout and he is going back there to get out of the house.  We talked about the things they will be cooking and he states they have grilled chicken so he will eat that.  His weight is up, but his diet has been poor compliance with low sodium choices and likely not limiting his fluids as he has done in the past.  Due to holiday clinic is closed.  He states he does feel more sob and his legs are very swollen.  I will f/u with him in the morning. No missed doses of his meds.  Today is his metolazone day so hopefully by morning we will have better numbers to report, if not I will contact clinic.  B/p elevated as well. He reports compliance with CPAP.  meds verified.  Pill box refilled.  Need to order a few meds for him--pharmacy is closed  today. He has enough potassium for today and tomor. I will call pharmacy in morning to get those filled and take back out there.  Potassium, losartan, metformin, spiro   [name], [place] Paramedic  02/17/20

## 2020-02-18 ENCOUNTER — Other Ambulatory Visit (HOSPITAL_COMMUNITY): Payer: Self-pay

## 2020-02-18 DIAGNOSIS — J9611 Chronic respiratory failure with hypoxia: Secondary | ICD-10-CM | POA: Diagnosis not present

## 2020-02-18 DIAGNOSIS — I5032 Chronic diastolic (congestive) heart failure: Secondary | ICD-10-CM | POA: Diagnosis not present

## 2020-02-18 NOTE — Progress Notes (Signed)
P/u meds from pharmacy, went by to place it in pill box. Also took his updated apt app to [facility] for him so he can be placed on wait list.  Apt manager there said for 1 bedroom placed it could be 1-2 years for a wait list as those rarely come available.   Kerry Hough, EMT-Paramedic  02/18/20

## 2020-02-19 ENCOUNTER — Telehealth (HOSPITAL_COMMUNITY): Payer: Self-pay | Admitting: *Deleted

## 2020-02-19 NOTE — Telephone Encounter (Signed)
Called for a monthly Health and Wellness (Exercise) Check in. Saw paramedic report with weight gain so provided education for patient and discussed plans for exercise at the [facility]. Patient reports his weight today at 397 (from 421 this weekend). Says he did not adhere to a low-sodium diet and was not aware of the food prepared for him and the sodium content. We spent some time talking about other foods and the way they are prepared at cookouts etc. I also expressed the danger of that quick of fluid retention and he was disappointed by his lack of knowledge and states he is going to pay better attention next time. Will try to see patient in clinic tomorrow morning.   Time spent telephonically with patient: 20 mins  Lesia Hausen, MS, ACSM, [facility] Clinical Exercise Physiologist/ Health and Wellness Coach

## 2020-02-20 ENCOUNTER — Encounter (HOSPITAL_COMMUNITY): Payer: Medicaid Other

## 2020-02-24 ENCOUNTER — Encounter (HOSPITAL_COMMUNITY): Payer: Self-pay | Admitting: *Deleted

## 2020-02-24 ENCOUNTER — Other Ambulatory Visit: Payer: Self-pay

## 2020-02-24 ENCOUNTER — Emergency Department (HOSPITAL_COMMUNITY): Payer: Medicaid Other

## 2020-02-24 ENCOUNTER — Observation Stay (HOSPITAL_COMMUNITY)
Admission: EM | Admit: 2020-02-24 | Discharge: 2020-02-25 | Disposition: A | Discharge status: Home / Self Care | Payer: Medicaid Other | Attending: Cardiology | Admitting: Cardiology

## 2020-02-24 ENCOUNTER — Other Ambulatory Visit (HOSPITAL_COMMUNITY): Payer: Self-pay

## 2020-02-24 DIAGNOSIS — R079 Chest pain, unspecified: Secondary | ICD-10-CM | POA: Diagnosis not present

## 2020-02-24 DIAGNOSIS — Z8616 Personal history of COVID-19: Secondary | ICD-10-CM | POA: Diagnosis not present

## 2020-02-24 DIAGNOSIS — Z7901 Long term (current) use of anticoagulants: Secondary | ICD-10-CM | POA: Insufficient documentation

## 2020-02-24 DIAGNOSIS — E1140 Type 2 diabetes mellitus with diabetic neuropathy, unspecified: Secondary | ICD-10-CM | POA: Diagnosis not present

## 2020-02-24 DIAGNOSIS — I160 Hypertensive urgency: Secondary | ICD-10-CM | POA: Diagnosis not present

## 2020-02-24 DIAGNOSIS — E1169 Type 2 diabetes mellitus with other specified complication: Secondary | ICD-10-CM | POA: Diagnosis present

## 2020-02-24 DIAGNOSIS — R600 Localized edema: Secondary | ICD-10-CM | POA: Diagnosis present

## 2020-02-24 DIAGNOSIS — K219 Gastro-esophageal reflux disease without esophagitis: Secondary | ICD-10-CM | POA: Diagnosis not present

## 2020-02-24 DIAGNOSIS — Z79899 Other long term (current) drug therapy: Secondary | ICD-10-CM | POA: Insufficient documentation

## 2020-02-24 DIAGNOSIS — R0602 Shortness of breath: Secondary | ICD-10-CM | POA: Diagnosis not present

## 2020-02-24 DIAGNOSIS — I480 Paroxysmal atrial fibrillation: Secondary | ICD-10-CM | POA: Diagnosis present

## 2020-02-24 DIAGNOSIS — J9621 Acute and chronic respiratory failure with hypoxia: Secondary | ICD-10-CM | POA: Diagnosis not present

## 2020-02-24 DIAGNOSIS — Z86711 Personal history of pulmonary embolism: Secondary | ICD-10-CM | POA: Diagnosis not present

## 2020-02-24 DIAGNOSIS — G4733 Obstructive sleep apnea (adult) (pediatric): Secondary | ICD-10-CM | POA: Diagnosis not present

## 2020-02-24 DIAGNOSIS — I509 Heart failure, unspecified: Secondary | ICD-10-CM

## 2020-02-24 DIAGNOSIS — I110 Hypertensive heart disease with heart failure: Secondary | ICD-10-CM | POA: Diagnosis not present

## 2020-02-24 DIAGNOSIS — R0789 Other chest pain: Secondary | ICD-10-CM | POA: Diagnosis not present

## 2020-02-24 DIAGNOSIS — Z6841 Body Mass Index (BMI) 40.0 and over, adult: Secondary | ICD-10-CM | POA: Diagnosis not present

## 2020-02-24 DIAGNOSIS — R0689 Other abnormalities of breathing: Secondary | ICD-10-CM | POA: Diagnosis not present

## 2020-02-24 DIAGNOSIS — E119 Type 2 diabetes mellitus without complications: Secondary | ICD-10-CM | POA: Diagnosis present

## 2020-02-24 DIAGNOSIS — E785 Hyperlipidemia, unspecified: Secondary | ICD-10-CM | POA: Diagnosis not present

## 2020-02-24 DIAGNOSIS — Z7984 Long term (current) use of oral hypoglycemic drugs: Secondary | ICD-10-CM | POA: Diagnosis not present

## 2020-02-24 DIAGNOSIS — Z20822 Contact with and (suspected) exposure to covid-19: Secondary | ICD-10-CM | POA: Diagnosis not present

## 2020-02-24 DIAGNOSIS — I5033 Acute on chronic diastolic (congestive) heart failure: Secondary | ICD-10-CM | POA: Diagnosis present

## 2020-02-24 DIAGNOSIS — R0902 Hypoxemia: Secondary | ICD-10-CM | POA: Diagnosis not present

## 2020-02-24 DIAGNOSIS — R0600 Dyspnea, unspecified: Secondary | ICD-10-CM | POA: Diagnosis present

## 2020-02-24 DIAGNOSIS — E669 Obesity, unspecified: Secondary | ICD-10-CM | POA: Diagnosis present

## 2020-02-24 DIAGNOSIS — J9610 Chronic respiratory failure, unspecified whether with hypoxia or hypercapnia: Secondary | ICD-10-CM | POA: Diagnosis present

## 2020-02-24 LAB — CBC WITH DIFFERENTIAL/PLATELET
Abs Immature Granulocytes: 0.05 10*3/uL (ref 0.00–0.07)
Basophils Absolute: 0 10*3/uL (ref 0.0–0.1)
Basophils Relative: 0 %
Eosinophils Absolute: 0.3 10*3/uL (ref 0.0–0.5)
Eosinophils Relative: 3 %
HCT: 46 % (ref 39.0–52.0)
Hemoglobin: 13.5 g/dL (ref 13.0–17.0)
Immature Granulocytes: 1 %
Lymphocytes Relative: 16 %
Lymphs Abs: 1.6 10*3/uL (ref 0.7–4.0)
MCH: 23.7 pg — ABNORMAL LOW (ref 26.0–34.0)
MCHC: 29.3 g/dL — ABNORMAL LOW (ref 30.0–36.0)
MCV: 80.7 fL (ref 80.0–100.0)
Monocytes Absolute: 0.8 10*3/uL (ref 0.1–1.0)
Monocytes Relative: 8 %
Neutro Abs: 7.4 10*3/uL (ref 1.7–7.7)
Neutrophils Relative %: 72 %
Platelets: 360 10*3/uL (ref 150–400)
RBC: 5.7 MIL/uL (ref 4.22–5.81)
RDW: 16.7 % — ABNORMAL HIGH (ref 11.5–15.5)
WBC: 10.2 10*3/uL (ref 4.0–10.5)
nRBC: 0 % (ref 0.0–0.2)

## 2020-02-24 LAB — COMPREHENSIVE METABOLIC PANEL
ALT: 18 U/L (ref 0–44)
AST: 24 U/L (ref 15–41)
Albumin: 3.3 g/dL — ABNORMAL LOW (ref 3.5–5.0)
Alkaline Phosphatase: 51 U/L (ref 38–126)
Anion gap: 9 (ref 5–15)
BUN: 8 mg/dL (ref 6–20)
CO2: 32 mmol/L (ref 22–32)
Calcium: 8.9 mg/dL (ref 8.9–10.3)
Chloride: 96 mmol/L — ABNORMAL LOW (ref 98–111)
Creatinine, Ser: 1.01 mg/dL (ref 0.61–1.24)
GFR calc Af Amer: >60 mL/min (ref >60)
GFR calc non Af Amer: >60 mL/min (ref >60)
Glucose, Bld: 99 mg/dL (ref 70–99)
Potassium: 3.9 mmol/L (ref 3.5–5.1)
Sodium: 137 mmol/L (ref 135–145)
Total Bilirubin: 0.6 mg/dL (ref 0.3–1.2)
Total Protein: 7.8 g/dL (ref 6.5–8.1)

## 2020-02-24 LAB — TROPONIN I (HIGH SENSITIVITY)
Troponin I (High Sensitivity): 28 ng/L — ABNORMAL HIGH (ref <18)
Troponin I (High Sensitivity): 26 ng/L — ABNORMAL HIGH (ref <18)

## 2020-02-24 LAB — CBG MONITORING, ED
Glucose-Capillary: 101 mg/dL — ABNORMAL HIGH (ref 70–99)
Glucose-Capillary: 102 mg/dL — ABNORMAL HIGH (ref 70–99)

## 2020-02-24 LAB — PROTIME-INR
INR: 1 (ref 0.8–1.2)
Prothrombin Time: 12.5 seconds (ref 11.4–15.2)

## 2020-02-24 LAB — MAGNESIUM: Magnesium: 1.6 mg/dL — ABNORMAL LOW (ref 1.7–2.4)

## 2020-02-24 LAB — APTT: aPTT: 29 seconds (ref 24–36)

## 2020-02-24 LAB — BRAIN NATRIURETIC PEPTIDE: B Natriuretic Peptide: 75.1 pg/mL (ref 0.0–100.0)

## 2020-02-24 LAB — D-DIMER, QUANTITATIVE: D-Dimer, Quant: 0.98 ug/mL-FEU — ABNORMAL HIGH (ref 0.00–0.50)

## 2020-02-24 MED ORDER — LOSARTAN POTASSIUM 50 MG PO TABS
100.0000 mg | ORAL_TABLET | Freq: Every day | ORAL | Status: DC
  Administered 2020-02-24: 100 mg via ORAL
  Filled 2020-02-24: qty 2

## 2020-02-24 MED ORDER — ONDANSETRON HCL 4 MG/2ML IJ SOLN: 4.0000 mg | Freq: Four times a day (QID) | INTRAMUSCULAR | Status: DC | PRN

## 2020-02-24 MED ORDER — LOSARTAN POTASSIUM 50 MG PO TABS: 50.0000 mg | ORAL_TABLET | Freq: Every day | ORAL | Status: DC

## 2020-02-24 MED ORDER — AMLODIPINE BESYLATE 5 MG PO TABS
10.0000 mg | ORAL_TABLET | Freq: Every day | ORAL | Status: DC
  Administered 2020-02-24: 10 mg via ORAL
  Filled 2020-02-24: qty 2

## 2020-02-24 MED ORDER — ENOXAPARIN SODIUM 40 MG/0.4ML ~~LOC~~ SOLN: 40.0000 mg | SUBCUTANEOUS | Status: DC

## 2020-02-24 MED ORDER — LABETALOL HCL 200 MG PO TABS
400.0000 mg | ORAL_TABLET | Freq: Two times a day (BID) | ORAL | Status: DC
  Administered 2020-02-24: 400 mg via ORAL
  Filled 2020-02-24 (×2): qty 2

## 2020-02-24 MED ORDER — INSULIN ASPART 100 UNIT/ML ~~LOC~~ SOLN: 0.0000 [IU] | Freq: Three times a day (TID) | SUBCUTANEOUS | Status: DC

## 2020-02-24 MED ORDER — RIVAROXABAN 20 MG PO TABS
20.0000 mg | ORAL_TABLET | Freq: Every day | ORAL | Status: DC
  Administered 2020-02-24: 20 mg via ORAL
  Filled 2020-02-24 (×2): qty 1

## 2020-02-24 MED ORDER — FUROSEMIDE 10 MG/ML IJ SOLN
80.0000 mg | Freq: Two times a day (BID) | INTRAMUSCULAR | Status: DC
  Administered 2020-02-24 – 2020-02-25 (×2): 80 mg via INTRAVENOUS
  Filled 2020-02-24 (×2): qty 8

## 2020-02-24 MED ORDER — ACETAMINOPHEN 325 MG PO TABS: 650.0000 mg | ORAL_TABLET | ORAL | Status: DC | PRN

## 2020-02-24 MED ORDER — ATORVASTATIN CALCIUM 40 MG PO TABS
40.0000 mg | ORAL_TABLET | Freq: Every day | ORAL | Status: DC
  Administered 2020-02-24: 40 mg via ORAL
  Filled 2020-02-24: qty 1

## 2020-02-24 MED ORDER — SODIUM CHLORIDE 0.9 % IV SOLN: 250.0000 mL | INTRAVENOUS | Status: DC | PRN

## 2020-02-24 MED ORDER — SODIUM CHLORIDE 0.9% FLUSH
3.0000 mL | Freq: Two times a day (BID) | INTRAVENOUS | Status: DC
  Administered 2020-02-24: 3 mL via INTRAVENOUS

## 2020-02-24 MED ORDER — FUROSEMIDE 10 MG/ML IJ SOLN: 80.0000 mg | Freq: Two times a day (BID) | INTRAMUSCULAR | Status: DC

## 2020-02-24 MED ORDER — SODIUM CHLORIDE 0.9% FLUSH: 3.0000 mL | INTRAVENOUS | Status: DC | PRN

## 2020-02-24 MED ORDER — SODIUM CHLORIDE 0.9 % IV BOLUS: 250.0000 mL | Freq: Once | INTRAVENOUS | Status: DC

## 2020-02-24 MED ORDER — LOSARTAN POTASSIUM 50 MG PO TABS: 100.0000 mg | ORAL_TABLET | Freq: Every day | ORAL | Status: DC

## 2020-02-24 MED ORDER — DOCUSATE SODIUM 100 MG PO CAPS
100.0000 mg | ORAL_CAPSULE | Freq: Every day | ORAL | Status: DC
  Administered 2020-02-24: 100 mg via ORAL
  Filled 2020-02-24: qty 1

## 2020-02-24 MED ORDER — MAGNESIUM OXIDE 400 (241.3 MG) MG PO TABS
400.0000 mg | ORAL_TABLET | Freq: Two times a day (BID) | ORAL | Status: DC
  Administered 2020-02-24: 400 mg via ORAL
  Filled 2020-02-24 (×2): qty 1

## 2020-02-24 MED ORDER — POTASSIUM CHLORIDE CRYS ER 20 MEQ PO TBCR
40.0000 meq | EXTENDED_RELEASE_TABLET | Freq: Once | ORAL | Status: AC
  Administered 2020-02-24: 40 meq via ORAL
  Filled 2020-02-24: qty 2

## 2020-02-24 NOTE — Progress Notes (Signed)
Paramedicine Encounter    Patient ID: Benjamin [name], male    DOB: 1966-07-31, 54 y.o.   MRN: [id number]   Patient Care Team: Benjamin Pounds, NP as PCP - General (Nurse Practitioner) Benjamin Records, MD as PCP - Cardiology (Cardiology) Benjamin Ny, LCSW as Social Worker (Licensed Clinical Social Worker)  Patient Active Problem List   Diagnosis Date Noted  . Diabetic neuropathy ([place]) 11/20/2019  . Coagulation disorder ([place]) 11/20/2019  . Acute on chronic diastolic CHF (congestive heart failure), NYHA class 3 ([place]) 07/30/2019  . Diabetes mellitus without complication ([place]) 63/87/5643  . Hypertensive emergency   . BRBPR (bright red blood per rectum) 06/12/2019  . Hypertension   . CHF (congestive heart failure) ([place])   . Sleep apnea   . Chronic respiratory failure ([place])   . Hypokalemia 05/29/2019  . GI bleed 05/29/2019  . Anemia 05/28/2019  . Acute on chronic diastolic CHF (congestive heart failure) ([place]) 04/11/2019  . Iron deficiency anemia 03/11/2019  . OSA on CPAP 03/11/2019  . HLD (hyperlipidemia) 03/11/2019  . Elevated troponin 03/11/2019  . GERD (gastroesophageal reflux disease) 03/11/2019  . Rectal bleeding 02/20/2019  . Dyspnea 02/06/2019  . COVID-19 virus infection 02/06/2019  . Bilateral lower extremity edema   . Morbid obesity with BMI of 50.0-59.9, adult ([place])   . Chronic diastolic CHF (congestive heart failure) ([place])   . PAF (paroxysmal atrial fibrillation) ([place])   . PE (pulmonary thromboembolism) ([place]) 01/21/2019  . Hypertensive urgency 01/21/2019  . Diabetes mellitus type 2 in obese ([place]) 01/21/2019   No current facility-administered medications for this visit.  Current Outpatient Medications:  .  albuterol (VENTOLIN HFA) 108 (90 Base) MCG/ACT inhaler, Inhale 2 puffs into the lungs every 6 (six) hours as needed for wheezing or shortness of breath. (Patient not taking: Reported on 02/17/2020), Disp: 8.5 g, Rfl: 2 .  amLODipine (NORVASC) 10 MG tablet, Take 1  tablet (10 mg total) by mouth daily., Disp: 90 tablet, Rfl: 1 .  atorvastatin (LIPITOR) 40 MG tablet, Take 1 tablet (40 mg total) by mouth at bedtime., Disp: 90 tablet, Rfl: 2 .  Blood Glucose Monitoring Suppl (TRUE METRIX METER) w/Device KIT, Use as instructed. Check blood glucose level by fingerstick twice per day. (Patient taking differently: 1 each by Other route See admin instructions. Use as instructed. Check blood glucose level by fingerstick twice per day.), Disp: 1 kit, Rfl: 0 .  Blood Pressure Monitor DEVI, Please provide patient with insurance approved blood pressure device with L-XL cuff. BMI 55 (Patient taking differently: 1 each by Other route See admin instructions. Please provide patient with insurance approved blood pressure device with L-XL cuff. BMI 55), Disp: 1 each, Rfl: 0 .  carboxymethylcellulose (REFRESH PLUS) 0.5 % SOLN, Place 1 drop into both eyes daily. , Disp: , Rfl:  .  docusate sodium (COLACE) 100 MG capsule, Take 100 mg by mouth 2 (two) times daily., Disp: , Rfl:  .  ferrous sulfate 325 (65 FE) MG tablet, Take 1 tablet (325 mg total) by mouth daily. Patient will pick up scripts today. (Patient taking differently: Take 325 mg by mouth daily. ), Disp: 90 tablet, Rfl: 0 .  glucose blood (TRUE METRIX BLOOD GLUCOSE TEST) test strip, Use as instructed. Check blood glucose level by fingerstick twice per day. (Patient taking differently: 1 each by Other route See admin instructions. Use as instructed. Check blood glucose level by fingerstick twice per day.), Disp: 100 each, Rfl: 12 .  labetalol (NORMODYNE)  200 MG tablet, Take 2 tablets (400 mg total) by mouth 2 (two) times daily., Disp: 120 tablet, Rfl: 3 .  levalbuterol (XOPENEX HFA) 45 MCG/ACT inhaler, Inhale 2 puffs into the lungs every 4 (four) hours as needed for wheezing. , Disp: , Rfl:  .  losartan (COZAAR) 100 MG tablet, Take 1 tablet (100 mg total) by mouth daily. (Patient taking differently: Take 100 mg by mouth at bedtime.  ), Disp: 30 tablet, Rfl: 5 .  magnesium oxide (MAG-OX) 400 MG tablet, Take 2 tablets (800 mg total) by mouth every morning AND 1 tablet (400 mg total) every evening., Disp: 90 tablet, Rfl: 6 .  metFORMIN (GLUCOPHAGE) 500 MG tablet, Take 1 tablet (500 mg total) by mouth 2 (two) times daily with a meal. Patient will pick up scripts today., Disp: 180 tablet, Rfl: 1 .  metolazone (ZAROXOLYN) 5 MG tablet, TAKE 1 TABLET EVERY MONDAY, WEDNESDAY, AND FRIDAY. (Patient taking differently: Take 5 mg by mouth See admin instructions. Take 1 tablet every Monday, Wednesday, and Friday.), Disp: 15 tablet, Rfl: 3 .  Misc. Devices MISC, Requires O2 @ 3L/min continuously via nasal canula and home fill system,. (Patient taking differently: 1 each by Other route See admin instructions. Requires O2 @ 3L/min continuously via nasal canula and home fill system,.), Disp: 1 each, Rfl: 0 .  Misc. Devices MISC, Please provide [name] with insurance approved portable O2 concentrator ICD 10 J96.11 [phone] (Patient taking differently: 1 each by Other route See admin instructions. Please provide [name] with insurance approved portable O2 concentrator ICD 10 J96.11 [id number]), Disp: 1 each, Rfl: 0 .  omeprazole (PRILOSEC) 20 MG capsule, Take 1 capsule (20 mg total) by mouth 2 (two) times daily before a meal. Take first capsule 30 min prior to eating or taking other medications., Disp: 60 capsule, Rfl: 2 .  potassium chloride SA (KLOR-CON) 10 MEQ tablet, Take 60 meq (6 tabs) in the morning, 60 meq (6 tabs) in the afternoon and 40 meq (4 tabs) in the evening.  Take an extra 4 tabs every dose of metolazone, Disp: 540 tablet, Rfl: 3 .  rivaroxaban (XARELTO) 20 MG TABS tablet, Take 1 tablet (20 mg total) by mouth daily with supper., Disp: 60 tablet, Rfl: 6 .  spironolactone (ALDACTONE) 50 MG tablet, Take 1 tablet (50 mg total) by mouth daily., Disp: 30 tablet, Rfl: 3 .  torsemide (DEMADEX) 100 MG tablet, Take 1 tablet (100 mg total) by  mouth 2 (two) times daily., Disp: 180 tablet, Rfl: 3 .  TRUEplus Lancets 28G MISC, Use as instructed. Check blood glucose level by fingerstick twice per day. (Patient taking differently: 1 each by Other route See admin instructions. Use as instructed. Check blood glucose level by fingerstick twice per day.), Disp: 200 each, Rfl: 3  Facility-Administered Medications Ordered in Other Visits:  .  sodium chloride 0.9 % bolus 250 mL, 250 mL, Intravenous, Once, Gekas, Jesse Fall, PA-C No Known Allergies    Social History   Socioeconomic History  . Marital status: Single    Spouse name: Not on file  . Number of children: Not on file  . Years of education: Not on file  . Highest education level: Not on file  Occupational History  . Not on file  Tobacco Use  . Smoking status: Former Research scientist (life sciences)  . Smokeless tobacco: Never Used  Substance and Sexual Activity  . Alcohol use: Not Currently  . Drug use: Not Currently  . Sexual activity: Not Currently  Other Topics Concern  . Not on file  Social History Narrative  . Not on file   Social Determinants of Health   Financial Resource Strain: Medium Risk  . Difficulty of Paying Living Expenses: Somewhat hard  Food Insecurity: Food Insecurity Present  . Worried About Charity fundraiser in the Last Year: Sometimes true  . Ran Out of Food in the Last Year: Sometimes true  Transportation Needs: No Transportation Needs  . Lack of Transportation (Medical): No  . Lack of Transportation (Non-Medical): No  Physical Activity:   . Days of Exercise per Week:   . Minutes of Exercise per Session:   Stress:   . Feeling of Stress :   Social Connections:   . Frequency of Communication with Friends and Family:   . Frequency of Social Gatherings with Friends and Family:   . Attends Religious Services:   . Active Member of Clubs or Organizations:   . Attends Archivist Meetings:   Marland Kitchen Marital Status:   Intimate Partner Violence:   . Fear of  Current or Ex-Partner:   . Emotionally Abused:   Marland Kitchen Physically Abused:   . Sexually Abused:     Physical Exam      Future Appointments  Date Time Provider [place]  03/19/2020  1:30 PM [facility] PA/NP [facility] None  04/17/2020 11:10 AM Benjamin Pounds, NP [facility] None    BP (!) 176/122   Pulse 76   SpO2 96% Comment: on 4LPM  Unsure of weights  When I arrived pt was sitting on edge of bed obviously in distress, obviously looking he does not feel well.  He appeared sob, trying to place on his CPAP. He reports last night he began to have pains in his epigastric/substernal area described as a burning pain that radiated straight through his back. He states this started last shortly after eating dinner. He reports eating roast, mashed potatoes, greens. The pain is constant. Does hurt when palpated and movement.  B/p elevated high.  He does report taking his meds this morning. He denies n/v/d. LBM was this morning but small in amount. He denies any bleeding.  It did not take much convincing to get him to go to ER so I know he was feeling bad, usually he does what he can to avoid going to ER.   I called for EMS unit to txp him to hosp.  I did a 12 lead EKG and it showed NSR. No ST changes were noted.  I will f/u once he is d/c and go back out to do med rec.  He did not weight past few days. Skin warm/dry.   [name], [facility] Paramedic  02/24/20

## 2020-02-24 NOTE — Progress Notes (Signed)
CSW follows pt in outpatient setting through the [employer].  CSW informed by [employer] paramedic that she was having him come to the ED for evaluation due to SOB and chest pain.  CSW met with pt at bedside to check in.  Continues to have chest pain and reports feeling badly since yesterday.  Remained alert and oriented during conversation and in relatively good spirits despite feeling poorly.  Medical staff should feel free to reach out to CSW or to [facility] paramedic, [name] [phone]) if you need further information regarding pt history or current medical compliance as [facility] paramedic follows patient closely in the home setting.  CSW will continue to follow in outpatient setting and check in with patient while he remains in the hospital.  [name], [place] Worker [facility] Desk#: [phone] Cell#: [phone]

## 2020-02-24 NOTE — ED Provider Notes (Signed)
[facility] EMERGENCY DEPARTMENT Provider Note   CSN: [id number] Arrival date & time: 02/24/20  1345     History Chief Complaint  Patient presents with  . Shortness of Breath    Benjamin [name] is a 54 y.o. male.  HPI    54vy/o wit hx of af, pe on xarelto and chf comes in with cc of L sided chest pain and shortness of breath. Patient's pain started y'day. It is L sided, intermittent, radiates to the back. No specific provoking, aggravating or relieving factor. It is similar to his PE pain. He has associated shortness of breath with exertion and has dizziness. He is usually on 2 liters of O2 for his chronic resp failire.   Past Medical History:  Diagnosis Date  . A-fib ([place])   . Anemia 05/29/2019  . CHF (congestive heart failure) ([place])   . Chronic respiratory failure ([place])   . Diabetes mellitus without complication ([place])   . Dyspnea   . Elevated troponin 02/06/2019  . Hypertension   . Obesity   . Pulmonary embolism ([place])   . Rectal bleeding 05/29/2019  . Sleep apnea     Patient Active Problem List   Diagnosis Date Noted  . Acute on chronic diastolic heart failure ([place]) 02/24/2020  . Diabetic neuropathy ([place]) 11/20/2019  . Coagulation disorder ([place]) 11/20/2019  . Acute on chronic diastolic CHF (congestive heart failure), NYHA class 3 ([place]) 07/30/2019  . Diabetes mellitus without complication ([place]) 20/94/7096  . Hypertensive emergency   . BRBPR (bright red blood per rectum) 06/12/2019  . Hypertension   . CHF (congestive heart failure) ([place])   . Sleep apnea   . Chronic respiratory failure ([place])   . Hypokalemia 05/29/2019  . GI bleed 05/29/2019  . Anemia 05/28/2019  . Acute on chronic diastolic CHF (congestive heart failure) ([place]) 04/11/2019  . Iron deficiency anemia 03/11/2019  . OSA on CPAP 03/11/2019  . HLD (hyperlipidemia) 03/11/2019  . Elevated troponin 03/11/2019  . GERD (gastroesophageal reflux disease) 03/11/2019  . Rectal bleeding  02/20/2019  . Dyspnea 02/06/2019  . COVID-19 virus infection 02/06/2019  . Bilateral lower extremity edema   . Morbid obesity with BMI of 50.0-59.9, adult ([place])   . Chronic diastolic CHF (congestive heart failure) ([place])   . PAF (paroxysmal atrial fibrillation) ([place])   . PE (pulmonary thromboembolism) ([place]) 01/21/2019  . Hypertensive urgency 01/21/2019  . Diabetes mellitus type 2 in obese ([place]) 01/21/2019    Past Surgical History:  Procedure Laterality Date  . COLONOSCOPY WITH PROPOFOL Left 02/25/2019   Procedure: COLONOSCOPY WITH PROPOFOL;  Surgeon: Arta Silence, MD;  Location: [facility];  Service: Endoscopy;  Laterality: Left;  . ESOPHAGOGASTRODUODENOSCOPY (EGD) WITH PROPOFOL Left 02/25/2019   Procedure: ESOPHAGOGASTRODUODENOSCOPY (EGD) WITH PROPOFOL;  Surgeon: Arta Silence, MD;  Location: [facility];  Service: Endoscopy;  Laterality: Left;  . ESOPHAGOGASTRODUODENOSCOPY (EGD) WITH PROPOFOL N/A 06/12/2019   Procedure: ESOPHAGOGASTRODUODENOSCOPY (EGD) WITH PROPOFOL;  Surgeon: Clarene Essex, MD;  Location: [facility];  Service: Gastroenterology;  Laterality: N/A;  . FLEXIBLE SIGMOIDOSCOPY N/A 06/12/2019   Procedure: FLEXIBLE SIGMOIDOSCOPY;  Surgeon: Clarene Essex, MD;  Location: [facility];  Service: Gastroenterology;  Laterality: N/A;  . HEMORRHOID BANDING  05/2019  . NO PAST SURGERIES    . RIGHT HEART CATH N/A 04/17/2019   Procedure: RIGHT HEART CATH;  Surgeon: Larey Dresser, MD;  Location: [facility];  Service: Cardiovascular;  Laterality: N/A;       Family History  Problem Relation  Age of Onset  . Hypertension Mother   . Diabetes Mother     Social History   Tobacco Use  . Smoking status: Former Research scientist (life sciences)  . Smokeless tobacco: Never Used  Substance Use Topics  . Alcohol use: Not Currently  . Drug use: Not Currently    Home Medications Prior to Admission medications   Medication Sig Start Date End Date Taking? Authorizing Provider  albuterol (VENTOLIN HFA)  108 (90 Base) MCG/ACT inhaler Inhale 2 puffs into the lungs every 6 (six) hours as needed for wheezing or shortness of breath. 08/28/19  Yes Charlott Rakes, MD  amLODipine (NORVASC) 10 MG tablet Take 1 tablet (10 mg total) by mouth daily. 06/04/19 11/25/20 Yes [name], NP  atorvastatin (LIPITOR) 40 MG tablet Take 1 tablet (40 mg total) by mouth at bedtime. 01/20/20 04/19/20 Yes Clegg, Amy D, NP  Blood Glucose Monitoring Suppl (TRUE METRIX METER) w/Device KIT Use as instructed. Check blood glucose level by fingerstick twice per day. Patient taking differently: 1 each by Other route See admin instructions. Use as instructed. Check blood glucose level by fingerstick twice per day. 03/18/19  Yes [name], NP  Blood Pressure Monitor DEVI Please provide patient with insurance approved blood pressure device with L-XL cuff. BMI 55 Patient taking differently: 1 each by Other route See admin instructions. Please provide patient with insurance approved blood pressure device with L-XL cuff. BMI 55 10/04/19  Yes [name], NP  carboxymethylcellulose (REFRESH PLUS) 0.5 % SOLN Place 1 drop into both eyes daily.    Yes [provider]  docusate sodium (COLACE) 100 MG capsule Take 100 mg by mouth 2 (two) times daily.   Yes [provider]  ferrous sulfate 325 (65 FE) MG tablet Take 1 tablet (325 mg total) by mouth daily. Patient will pick up scripts today. Patient taking differently: Take 325 mg by mouth daily.  06/24/19 11/25/20 Yes [name], NP  glucose blood (TRUE METRIX BLOOD GLUCOSE TEST) test strip Use as instructed. Check blood glucose level by fingerstick twice per day. Patient taking differently: 1 each by Other route See admin instructions. Use as instructed. Check blood glucose level by fingerstick twice per day. 03/18/19  Yes [name], NP  labetalol (NORMODYNE) 200 MG tablet Take 2 tablets (400 mg total) by mouth 2 (two) times daily. 01/13/20  Yes Rosita Fire,  Brittainy M, PA-C  losartan (COZAAR) 100 MG tablet Take 1 tablet (100 mg total) by mouth daily. Patient taking differently: Take 100 mg by mouth at bedtime.  10/28/19 10/27/20 Yes Clegg, Amy D, NP  magnesium oxide (MAG-OX) 400 MG tablet Take 2 tablets (800 mg total) by mouth every morning AND 1 tablet (400 mg total) every evening. 10/23/19  Yes Larey Dresser, MD  metFORMIN (GLUCOPHAGE) 500 MG tablet Take 1 tablet (500 mg total) by mouth 2 (two) times daily with a meal. Patient will pick up scripts today. 06/28/19 11/25/20 Yes [name], NP  metolazone (ZAROXOLYN) 5 MG tablet TAKE 1 TABLET EVERY MONDAY, WEDNESDAY, AND FRIDAY. Patient taking differently: Take 5 mg by mouth See admin instructions. Take 1 tablet every Monday, Wednesday, and Friday. 12/30/19  Yes Larey Dresser, MD  Misc. Devices MISC Requires O2 @ 3L/min continuously via nasal canula and home fill system,. Patient taking differently: 1 each by Other route See admin instructions. Requires O2 @ 3L/min continuously via nasal canula and home fill system,. 07/03/19  Yes [name], NP  Misc.  Devices MISC Please provide Mr. [place] with insurance approved portable O2 concentrator ICD 10 J96.11 Z99.81 Patient taking differently: 1 each by Other route See admin instructions. Please provide Mr. [place] with insurance approved portable O2 concentrator ICD 10 J96.11 Z99.81 08/27/19  Yes [name], NP  omeprazole (PRILOSEC) 20 MG capsule Take 1 capsule (20 mg total) by mouth 2 (two) times daily before a meal. Take first capsule 30 min prior to eating or taking other medications. 01/01/20 02/24/20 Yes [name], NP  potassium chloride SA (KLOR-CON) 10 MEQ tablet Take 60 meq (6 tabs) in the morning, 60 meq (6 tabs) in the afternoon and 40 meq (4 tabs) in the evening.  Take an extra 4 tabs every dose of metolazone Patient taking differently: Take 40-60 mEq by mouth See admin instructions. Take 60 meq (6 tabs) in the morning, 60 meq (6  tabs) in the afternoon and 40 meq (4 tabs) in the evening.  Take an extra 4 tabs every dose of metolazone 01/08/20  Yes Clegg, Amy D, NP  rivaroxaban (XARELTO) 20 MG TABS tablet Take 1 tablet (20 mg total) by mouth daily with supper. 08/02/19  Yes Lyda Jester M, PA-C  spironolactone (ALDACTONE) 50 MG tablet Take 1 tablet (50 mg total) by mouth daily. 11/26/19  Yes Clegg, Amy D, NP  torsemide (DEMADEX) 100 MG tablet Take 1 tablet (100 mg total) by mouth 2 (two) times daily. 08/18/19  Yes Larey Dresser, MD  TRUEplus Lancets 28G MISC Use as instructed. Check blood glucose level by fingerstick twice per day. Patient taking differently: 1 each by Other route See admin instructions. Use as instructed. Check blood glucose level by fingerstick twice per day. 03/18/19  Yes [name], NP    Allergies    Patient has no known allergies.  Review of Systems   Review of Systems  Constitutional: Positive for activity change.  Respiratory: Positive for shortness of breath.   Cardiovascular: Positive for chest pain.  Gastrointestinal: Negative for nausea and vomiting.  Neurological: Positive for dizziness.  Hematological: Bruises/bleeds easily.  All other systems reviewed and are negative.   Physical Exam Updated Vital Signs BP (!) 202/108   Pulse 78   Temp 98.5 F (36.9 C) (Oral)   Resp 15   SpO2 96%   Physical Exam Vitals and nursing note reviewed.  Constitutional:      Appearance: He is well-developed.     Comments: Elevated BMI  HENT:     Head: Atraumatic.  Cardiovascular:     Rate and Rhythm: Normal rate.  Pulmonary:     Effort: Pulmonary effort is normal.     Breath sounds: Wheezing present.  Musculoskeletal:     Cervical back: Neck supple.     Right lower leg: Edema present.     Left lower leg: Edema present.  Skin:    General: Skin is warm.  Neurological:     Mental Status: He is alert and oriented to person, place, and time.     ED Results / Procedures /  Treatments   Labs (all labs ordered are listed, but only abnormal results are displayed) Labs Reviewed  MAGNESIUM - Abnormal; Notable for the following components:      Result Value   Magnesium 1.6 (*)    All other components within normal limits  COMPREHENSIVE METABOLIC PANEL - Abnormal; Notable for the following components:   Chloride 96 (*)    Albumin 3.3 (*)    All other components  within normal limits  CBC WITH DIFFERENTIAL/PLATELET - Abnormal; Notable for the following components:   MCH 23.7 (*)    MCHC 29.3 (*)    RDW 16.7 (*)    All other components within normal limits  D-DIMER, QUANTITATIVE (NOT AT [facility]) - Abnormal; Notable for the following components:   D-Dimer, Quant 0.98 (*)    All other components within normal limits  BRAIN NATRIURETIC PEPTIDE  PROTIME-INR  APTT  CBG MONITORING, ED  TROPONIN I (HIGH SENSITIVITY)  TROPONIN I (HIGH SENSITIVITY)    EKG EKG Interpretation  Date/Time:  Monday February 24 2020 14:02:37 EDT Ventricular Rate:  80 PR Interval:    QRS Duration: 98 QT Interval:  377 QTC Calculation: 435 R Axis:   75 Text Interpretation: Sinus rhythm Nonspecific T abnormalities, lateral leads Borderline ST elevation, anterior leads No acute changes Nonspecific ST and T wave abnormality No significant change since last tracing Confirmed by Varney Biles [id number]) on 02/24/2020 2:32:56 PM   Radiology DG Chest Portable 1 View  Result Date: 02/24/2020 CLINICAL DATA:  Shortness of breath EXAM: PORTABLE CHEST 1 VIEW COMPARISON:  08/13/2019 FINDINGS: 2 frontal views of the chest demonstrate stable enlargement of the cardiac silhouette. There is central vascular congestion without focal consolidation, effusion, or pneumothorax. No acute bony abnormalities. IMPRESSION: 1. Enlarged cardiac silhouette. 2. Central vascular congestion without overt edema. Electronically Signed   By: Randa Ngo M.D.   On: 02/24/2020 15:18    Procedures .Critical Care Performed by:  Varney Biles, MD Authorized by: Varney Biles, MD   Critical care provider statement:    Critical care time (minutes):  48   Critical care was necessary to treat or prevent imminent or life-threatening deterioration of the following conditions:  Cardiac failure and respiratory failure   Critical care was time spent personally by me on the following activities:  Discussions with consultants, evaluation of patient's response to treatment, examination of patient, ordering and performing treatments and interventions, ordering and review of laboratory studies, ordering and review of radiographic studies, pulse oximetry, re-evaluation of patient's condition, obtaining history from patient or surrogate and review of old charts   (including critical care time)  Medications Ordered in ED Medications  furosemide (LASIX) injection 80 mg (80 mg Intravenous Given 02/24/20 1644)  labetalol (NORMODYNE) tablet 400 mg (has no administration in time range)  magnesium oxide (MAG-OX) tablet 400 mg (has no administration in time range)  atorvastatin (LIPITOR) tablet 40 mg (has no administration in time range)  docusate sodium (COLACE) capsule 100 mg (has no administration in time range)  rivaroxaban (XARELTO) tablet 20 mg (has no administration in time range)  insulin aspart (novoLOG) injection 0-20 Units (has no administration in time range)  amLODipine (NORVASC) tablet 10 mg (10 mg Oral Given 02/24/20 1759)  losartan (COZAAR) tablet 100 mg (100 mg Oral Given 02/24/20 1759)  potassium chloride SA (KLOR-CON) CR tablet 40 mEq (40 mEq Oral Given 02/24/20 1644)    ED Course  I have reviewed the triage vital signs and the nursing notes.  Pertinent labs & imaging results that were available during my care of the patient were reviewed by me and considered in my medical decision making (see chart for details).    MDM Rules/Calculators/A&P                      54 y.o with hx of PE, Af and CHF comes in with cc of  chest pain and dib. DDx; ACS, PE, Chf  exacerbation, pneumonia, pulm edema/pleural effusion.  Labs ordered and reassuring. RLE edema noted. Will order DVT study. BNP was fine. CXR reassuring. Given that there is new hypoxia, we will get dimer. CT PE if + . He is on DOAC, so possibly he has no PE but he could be a long hauler from covid. Will also consult Cards as pt feels that he has gained weight and thinks symptoms are due to chf.  Final Clinical Impression(s) / ED Diagnoses Final diagnoses:  Acute congestive heart failure, unspecified heart failure type ([place])  Acute on chronic respiratory failure with hypoxia [facility])    Rx / DC Orders ED Discharge Orders    None       Varney Biles, MD 02/24/20 1847

## 2020-02-24 NOTE — ED Notes (Signed)
Pt ambulated to bathroom unassisted. Pulse oxygen stayed above 90% on RA with ambulation. 2L [place] restarted upon returning to the room for pt's comfort

## 2020-02-24 NOTE — Consult Note (Addendum)
Advanced Heart Failure Team Consult Note   Primary Physician: [name], NP PCP-Cardiologist:  Dorris Carnes, MD  Reason for Consultation: Heart Failure   HPI:   Heart failure  Benjamin [name] is seen today for evaluation of heart failureat the request of Dr  [name].   Mr.Williamsis a 54 yo with hx of diastolic CHF, HTN, DM, Atrial fib, PE in April 2020 (on Xarelto), OSA, GERD,chronic elevation of troponin, gi bleeding, and chronic diastolic heart failure. He has had multiple admit for A/C diastolic heart failure.   He has been followed in the community by HF Paramedicine and in the HF clinic. Weight at home trending up 390--->407 pounds. Yesterday had roast beef and mashed potatoes. Drinking lots of fluids. Last night had some chest discomfort. Today he is able to localize the pain lower portion of sternum. Today he was SOB at rest and with minimal exertion. Denies bleeding issues. Says he has been taking all medications. He has been wearing his oxygen and CPAP. Continues to live in a boarding house.   He presented to the ED via EMS with increased shortness of breath. HF Paramedic was at house today and called EMS because he was in respiratory distress.  CXR with vascular congestion. Pertinent lab: BNP 75, K 3.9, creatinine 1, Mag 1.6, WBC 10, hgb 13. BP 202/108.  Lower extremity dopplers - pending.   Echo 06/2019 EF 65-70%  LA mildly dilated, RA normal  Review of Systems: [y] = yes, '[ ]'  = no   . General: Weight gain [Y ]; Weight loss '[ ]' ; Anorexia '[ ]' ; Fatigue '[ ]' ; Fever '[ ]' ; Chills '[ ]' ; Weakness '[ ]'   . Cardiac: Chest pain/pressure '[ ]' ; Resting SOB [ Y]; Exertional SOB [Y ]; Orthopnea [Y]; Pedal Edema [Y ]; Palpitations '[ ]' ; Syncope '[ ]' ; Presyncope '[ ]' ; Paroxysmal nocturnal dyspnea'[ ]'   . Pulmonary: Cough '[ ]' ; Wheezing'[ ]' ; Hemoptysis'[ ]' ; Sputum '[ ]' ; Snoring '[ ]'   . GI: Vomiting'[ ]' ; Dysphagia'[ ]' ; Melena'[ ]' ; Hematochezia '[ ]' ; Heartburn'[ ]' ; Abdominal pain '[ ]' ; Constipation '[ ]' ;  Diarrhea '[ ]' ; BRBPR '[ ]'   . GU: Hematuria'[ ]' ; Dysuria '[ ]' ; Nocturia'[ ]'   . Vascular: Pain in legs with walking '[ ]' ; Pain in feet with lying flat '[ ]' ; Non-healing sores '[ ]' ; Stroke '[ ]' ; TIA '[ ]' ; Slurred speech '[ ]' ;  . Neuro: Headaches'[ ]' ; Vertigo'[ ]' ; Seizures'[ ]' ; Paresthesias'[ ]' ;Blurred vision '[ ]' ; Diplopia '[ ]' ; Vision changes '[ ]'   . Ortho/Skin: Arthritis '[ ]' ; Joint pain [Y ]; Muscle pain '[ ]' ; Joint swelling '[ ]' ; Back Pain '[ ]' ; Rash '[ ]'   . Psych: Depression'[ ]' ; Anxiety'[ ]'   . Heme: Bleeding problems '[ ]' ; Clotting disorders '[ ]' ; Anemia '[ ]'   . Endocrine: Diabetes [ Y]; Thyroid dysfunction'[ ]'   Home Medications Prior to Admission medications   Medication Sig Start Date End Date Taking? Authorizing Provider  albuterol (VENTOLIN HFA) 108 (90 Base) MCG/ACT inhaler Inhale 2 puffs into the lungs every 6 (six) hours as needed for wheezing or shortness of breath. Patient not taking: Reported on 02/17/2020 08/28/19   Charlott Rakes, MD  amLODipine (NORVASC) 10 MG tablet Take 1 tablet (10 mg total) by mouth daily. 06/04/19 11/25/20  [name], NP  atorvastatin (LIPITOR) 40 MG tablet Take 1 tablet (40 mg total) by mouth at bedtime. 01/20/20 04/19/20  Clegg, Amy D, NP  Blood Glucose Monitoring Suppl (TRUE METRIX METER) w/Device KIT Use as instructed.  Check blood glucose level by fingerstick twice per day. Patient taking differently: 1 each by Other route See admin instructions. Use as instructed. Check blood glucose level by fingerstick twice per day. 03/18/19   [name], NP  Blood Pressure Monitor DEVI Please provide patient with insurance approved blood pressure device with L-XL cuff. BMI 55 Patient taking differently: 1 each by Other route See admin instructions. Please provide patient with insurance approved blood pressure device with L-XL cuff. BMI 55 10/04/19   [name], NP  carboxymethylcellulose (REFRESH PLUS) 0.5 % SOLN Place 1 drop into both eyes daily.     [provider]  docusate  sodium (COLACE) 100 MG capsule Take 100 mg by mouth 2 (two) times daily.    [provider]  ferrous sulfate 325 (65 FE) MG tablet Take 1 tablet (325 mg total) by mouth daily. Patient will pick up scripts today. Patient taking differently: Take 325 mg by mouth daily.  06/24/19 11/25/20  [name], NP  glucose blood (TRUE METRIX BLOOD GLUCOSE TEST) test strip Use as instructed. Check blood glucose level by fingerstick twice per day. Patient taking differently: 1 each by Other route See admin instructions. Use as instructed. Check blood glucose level by fingerstick twice per day. 03/18/19   [name], NP  labetalol (NORMODYNE) 200 MG tablet Take 2 tablets (400 mg total) by mouth 2 (two) times daily. 01/13/20   Lyda Jester M, PA-C  levalbuterol [facility] HFA) 45 MCG/ACT inhaler Inhale 2 puffs into the lungs every 4 (four) hours as needed for wheezing.     [provider]  losartan (COZAAR) 100 MG tablet Take 1 tablet (100 mg total) by mouth daily. Patient taking differently: Take 100 mg by mouth at bedtime.  10/28/19 10/27/20  Clegg, Amy D, NP  magnesium oxide (MAG-OX) 400 MG tablet Take 2 tablets (800 mg total) by mouth every morning AND 1 tablet (400 mg total) every evening. 10/23/19   Larey Dresser, MD  metFORMIN (GLUCOPHAGE) 500 MG tablet Take 1 tablet (500 mg total) by mouth 2 (two) times daily with a meal. Patient will pick up scripts today. 06/28/19 11/25/20  [name], NP  metolazone (ZAROXOLYN) 5 MG tablet TAKE 1 TABLET EVERY MONDAY, WEDNESDAY, AND FRIDAY. Patient taking differently: Take 5 mg by mouth See admin instructions. Take 1 tablet every Monday, Wednesday, and Friday. 12/30/19   Larey Dresser, MD  Misc. Devices MISC Requires O2 @ 3L/min continuously via nasal canula and home fill system,. Patient taking differently: 1 each by Other route See admin instructions. Requires O2 @ 3L/min continuously via nasal canula and home fill system,. 07/03/19   [name], NP  Misc. Devices MISC Please provide Mr. Rubens with insurance approved portable O2 concentrator ICD 10 J96.11 Z99.81 Patient taking differently: 1 each by Other route See admin instructions. Please provide Mr. Bord with insurance approved portable O2 concentrator ICD 10 J96.11 Z99.81 08/27/19   [name], NP  omeprazole (PRILOSEC) 20 MG capsule Take 1 capsule (20 mg total) by mouth 2 (two) times daily before a meal. Take first capsule 30 min prior to eating or taking other medications. 01/01/20 01/31/20  [name], NP  potassium chloride SA (KLOR-CON) 10 MEQ tablet Take 60 meq (6 tabs) in the morning, 60 meq (6 tabs) in the afternoon and 40 meq (4 tabs) in the evening.  Take an extra 4 tabs every dose of metolazone 01/08/20   Clegg,  Amy D, NP  rivaroxaban (XARELTO) 20 MG TABS tablet Take 1 tablet (20 mg total) by mouth daily with supper. 08/02/19   Consuelo Pandy, PA-C  spironolactone (ALDACTONE) 50 MG tablet Take 1 tablet (50 mg total) by mouth daily. 11/26/19   Clegg, Amy D, NP  torsemide (DEMADEX) 100 MG tablet Take 1 tablet (100 mg total) by mouth 2 (two) times daily. 08/18/19   Larey Dresser, MD  TRUEplus Lancets 28G MISC Use as instructed. Check blood glucose level by fingerstick twice per day. Patient taking differently: 1 each by Other route See admin instructions. Use as instructed. Check blood glucose level by fingerstick twice per day. 03/18/19   [name], NP    Past Medical History: Past Medical History:  Diagnosis Date  . A-fib ([place])   . Anemia 05/29/2019  . CHF (congestive heart failure) ([place])   . Chronic respiratory failure ([place])   . Diabetes mellitus without complication ([place])   . Dyspnea   . Elevated troponin 02/06/2019  . Hypertension   . Obesity   . Pulmonary embolism ([place])   . Rectal bleeding 05/29/2019  . Sleep apnea     Past Surgical History: Past Surgical History:  Procedure Laterality Date  . COLONOSCOPY WITH PROPOFOL  Left 02/25/2019   Procedure: COLONOSCOPY WITH PROPOFOL;  Surgeon: Arta Silence, MD;  Location: [facility];  Service: Endoscopy;  Laterality: Left;  . ESOPHAGOGASTRODUODENOSCOPY (EGD) WITH PROPOFOL Left 02/25/2019   Procedure: ESOPHAGOGASTRODUODENOSCOPY (EGD) WITH PROPOFOL;  Surgeon: Arta Silence, MD;  [facility] ENDOSCOPY;  Service: Endoscopy;  Laterality: Left;  . ESOPHAGOGASTRODUODENOSCOPY (EGD) WITH PROPOFOL N/A 06/12/2019   Procedure: ESOPHAGOGASTRODUODENOSCOPY (EGD) WITH PROPOFOL;  Surgeon: Clarene Essex, MD;  Location: [facility];  Service: Gastroenterology;  Laterality: N/A;  . FLEXIBLE SIGMOIDOSCOPY N/A 06/12/2019   Procedure: FLEXIBLE SIGMOIDOSCOPY;  Surgeon: Clarene Essex, MD;  Location: [place];  Service: Gastroenterology;  Laterality: N/A;  . HEMORRHOID BANDING  05/2019  . NO PAST SURGERIES    . RIGHT HEART CATH N/A 04/17/2019   Procedure: RIGHT HEART CATH;  Surgeon: Larey Dresser, MD;  Location: [place] CV LAB;  Service: Cardiovascular;  Laterality: N/A;    Family History: Family History  Problem Relation Age of Onset  . Hypertension Mother   . Diabetes Mother     Social History: Social History   Socioeconomic History  . Marital status: Single    Spouse name: Not on file  . Number of children: Not on file  . Years of education: Not on file  . Highest education level: Not on file  Occupational History  . Not on file  Tobacco Use  . Smoking status: Former Research scientist (life sciences)  . Smokeless tobacco: Never Used  Substance and Sexual Activity  . Alcohol use: Not Currently  . Drug use: Not Currently  . Sexual activity: Not Currently  Other Topics Concern  . Not on file  Social History Narrative  . Not on file   Social Determinants of Health   Financial Resource Strain: Medium Risk  . Difficulty of Paying Living Expenses: Somewhat hard  Food Insecurity: Food Insecurity Present  . Worried About Charity fundraiser in the Last Year: Sometimes true  . Ran Out of  Food in the Last Year: Sometimes true  Transportation Needs: No Transportation Needs  . Lack of Transportation (Medical): No  . Lack of Transportation (Non-Medical): No  Physical Activity:   . Days of Exercise per Week:   . Minutes of Exercise per Session:  Stress:   . Feeling of Stress :   Social Connections:   . Frequency of Communication with Friends and Family:   . Frequency of Social Gatherings with Friends and Family:   . Attends Religious Services:   . Active Member of Clubs or Organizations:   . Attends Archivist Meetings:   Marland Kitchen Marital Status:     Allergies:  No Known Allergies  Objective:    Vital Signs:   Temp:  [98.5 F (36.9 C)] 98.5 F (36.9 C) (06/07 1403) Pulse Rate:  [75-79] 76 (06/07 1523) Resp:  [18-20] 18 (06/07 1430) BP: (160-176)/(120-122) 176/122 (06/07 1523) SpO2:  [96 %-99 %] 96 % (06/07 1523)    Weight change: There were no vitals filed for this visit.  Intake/Output:  No intake or output data in the 24 hours ending 02/24/20 1611    Physical Exam    General:   No resp difficulty HEENT: normal Neck: supple. JVP difficult to asses due body habitus.  Carotids 2+ bilat; no bruits. No lymphadenopathy or thyromegaly appreciated. Cor: PMI nondisplaced. Regular rate & rhythm. No rubs, gallops or murmurs. Tender lower sternum.  Lungs: Crackles in the bases on 2 liters [place].  Abdomen: obese, soft, nontender, nondistended. No hepatosplenomegaly. No bruits or masses. Good bowel sounds. Extremities: no cyanosis, clubbing, rash, R and LLE 1+ edema Neuro: alert & orientedx3, cranial nerves grossly intact. moves all 4 extremities w/o difficulty. Affect pleasant   Telemetry   SR 70-80 s  EKG    SR 80 bpm   Labs   Basic Metabolic Panel: Recent Labs  Lab 02/24/20 1501  NA 137  K 3.9  CL 96*  CO2 32  GLUCOSE 99  BUN 8  CREATININE 1.01  CALCIUM 8.9  MG 1.6*    Liver Function Tests: Recent Labs  Lab 02/24/20 1501  AST 24    ALT 18  ALKPHOS 51  BILITOT 0.6  PROT 7.8  ALBUMIN 3.3*   No results for input(s): LIPASE, AMYLASE in the last 168 hours. No results for input(s): AMMONIA in the last 168 hours.  CBC: Recent Labs  Lab 02/24/20 1501  WBC 10.2  NEUTROABS 7.4  HGB 13.5  HCT 46.0  MCV 80.7  PLT 360    Cardiac Enzymes: No results for input(s): CKTOTAL, CKMB, CKMBINDEX, TROPONINI in the last 168 hours.  BNP: BNP (last 3 results) Recent Labs    08/16/19 0849 08/22/19 1353 02/24/20 1501  BNP 13.9 42.8 75.1    ProBNP (last 3 results) No results for input(s): PROBNP in the last 8760 hours.   CBG: No results for input(s): GLUCAP in the last 168 hours.  Coagulation Studies: Recent Labs    02/24/20 1501  LABPROT 12.5  INR 1.0     Imaging   DG Chest Portable 1 View  Result Date: 02/24/2020 CLINICAL DATA:  Shortness of breath EXAM: PORTABLE CHEST 1 VIEW COMPARISON:  08/13/2019 FINDINGS: 2 frontal views of the chest demonstrate stable enlargement of the cardiac silhouette. There is central vascular congestion without focal consolidation, effusion, or pneumothorax. No acute bony abnormalities. IMPRESSION: 1. Enlarged cardiac silhouette. 2. Central vascular congestion without overt edema. Electronically Signed   By: Randa Ngo M.D.   On: 02/24/2020 15:18      Medications:     Current Medications:   Infusions: . sodium chloride         Assessment/Plan   1. A/C Diastolic Heart Failure ECHO  06/2019 EF 65-70% Presented with dyspnea at  rest. Suspect due to volume overload.  NYHA III with minimal exertion. Volume status elevated. Suspect related to his diet/fluid intake.  - Start lasix 80 mg IV twice a day. Give 40 meq K now. - Follow BMET    2. OSA Will need to continue CPAP at night.   3. DMII - Start sliding scale.   4. Obesity   5. H/O PE - Continue xarelto 20 mg daily. Has not missed any doses of medications.  - Lower ext doppler pendin.   6. PAF In NSR  today.  - Continue labetalol 400 mg twice a day  - Continue xarelto 20 mg at supper  7. HTN Urgency - Restart home meds    8 Chronic Hypoxic Respiratory Failure On chronic oxygen 2 liters [place]. Sats stable.   9/ Chest Pain Chest pain over the last 24 hours. Able to localize. Tender to touch.  - Check HS Trop - EKG - no ST elevation noted.  - Continue lipitor.   Admit to progressive care.    Medication concerns reviewed with patient and pharmacy team. Barriers identified: Continue HF Paramedicine for medications.   Length of Stay: 0  Darrick Grinder, NP  02/24/2020, 4:11 PM  Advanced Heart Failure Team Pager [phone] (M-F; 7a - 4p)  Please contact [facility] for night-coverage after hours (4p -7a ) and weekends on [email or web address]  Patient seen with NP, agree with the above note.   Increased dyspnea + weight gain, high sodium diet recently.  Currently, SBP 200/100 but has not taken any of his home BP meds today.  CXR with vascular congestion.    General: NAD, morbidly obese Neck: JVP difficult, no thyromegaly or thyroid nodule.  Lungs: Clear to auscultation bilaterally with normal respiratory effort. CV: Nonpalpable PMI.  Heart regular S1/S2, no S3/S4, no murmur.  1+ chronic edema to knees.  No carotid bruit.  Normal pedal pulses.  Abdomen: Soft, nontender, no hepatosplenomegaly, no distention.  Skin: Intact without lesions or rashes.  Neurologic: Alert and oriented x 3.  Psych: Normal affect. Extremities: No clubbing or cyanosis.  HEENT: Normal.   Suspect acute on chronic diastolic CHF, likely due to dietary noncompliance.  Also has OHS/OSA on home oxygen and CPAP.  - Lasix 80 mg IV bid for now.   Will give home BP meds now, if BP remains significantly elevated, can start NTG gtt.   Will send HS-TnI, but doubt ACS.   He remains in NSR, continue Xarelto.   Loralie Champagne 02/24/2020 5:23 PM

## 2020-02-24 NOTE — ED Notes (Signed)
PT falling asleep during triage . Pt reports he is to short of breath to answer questions.

## 2020-02-24 NOTE — ED Notes (Signed)
Heart healthy/carb modified 1800 ML fluid restriction dinner tray ordered

## 2020-02-24 NOTE — ED Triage Notes (Signed)
PT SHOB increased use of C-PAP at home . EMS increased nasa O2 to 4 liters for SPO@ 95. PT SHOB with rest

## 2020-02-25 ENCOUNTER — Encounter (HOSPITAL_COMMUNITY): Payer: Medicaid Other

## 2020-02-25 ENCOUNTER — Other Ambulatory Visit (HOSPITAL_COMMUNITY): Payer: Medicaid Other

## 2020-02-25 ENCOUNTER — Other Ambulatory Visit: Payer: Self-pay

## 2020-02-25 DIAGNOSIS — I5033 Acute on chronic diastolic (congestive) heart failure: Secondary | ICD-10-CM | POA: Diagnosis not present

## 2020-02-25 LAB — BASIC METABOLIC PANEL
Anion gap: 10 (ref 5–15)
BUN: 11 mg/dL (ref 6–20)
CO2: 30 mmol/L (ref 22–32)
Calcium: 8.7 mg/dL — ABNORMAL LOW (ref 8.9–10.3)
Chloride: 96 mmol/L — ABNORMAL LOW (ref 98–111)
Creatinine, Ser: 1.14 mg/dL (ref 0.61–1.24)
GFR calc Af Amer: >60 mL/min (ref >60)
GFR calc non Af Amer: >60 mL/min (ref >60)
Glucose, Bld: 119 mg/dL — ABNORMAL HIGH (ref 70–99)
Potassium: 3.9 mmol/L (ref 3.5–5.1)
Sodium: 136 mmol/L (ref 135–145)

## 2020-02-25 LAB — CBG MONITORING, ED
Glucose-Capillary: 104 mg/dL — ABNORMAL HIGH (ref 70–99)
Glucose-Capillary: 98 mg/dL (ref 70–99)

## 2020-02-25 LAB — SARS CORONAVIRUS 2 BY RT PCR (HOSPITAL ORDER, PERFORMED IN ~~LOC~~ HOSPITAL LAB): SARS Coronavirus 2: NEGATIVE

## 2020-02-25 NOTE — Discharge Summary (Addendum)
Advanced Heart Failure Team  Discharge Summary   Patient ID: Benjamin [name] MRN: [id number], DOB/AGE: 01-29-1966 54 y.o. Admit date: 02/24/2020 D/C date:     02/25/2020   Primary Discharge Diagnoses:  1. A/C Diastolic Heart Failure 2. OSA  3. DMII 4. Obesity  5. H/O PE 6. PAF 7. HTN Urgency  8. Chronic Hypoxic Respiratory Failure 9 Chest Pain   Hospital Course:    Benjamin [name] is a 54 yo with hx of diastolic CHF, HTN, DM, Atrial fib, PE in April 2020 (on Xarelto), OSA, GERD,chronic elevation of troponin, gi bleeding, and chronic diastolic heart failure. He has had multiple admit for A/C diastolic heart failure.   Admitted with volume overload and chest pain. HS Trop negative. Diuresed with IV lasix with brisk diuresis noted. Placed back on torsemide and metolazone.   He will continue to be followed closely in the HF clinic. He has follow up on Monday. HF Paramedicine will also follow.   See below for detailed d/c summary.   1. A/C Diastolic Heart Failure ECHO  06/2019 EF 65-70% Presented with dyspnea at rest. Suspect due to volume overload.  NYHA III with minimal exertion. Volume status elevated. Suspect related to his diet/fluid intake.  - Diuresed with IV lasix with brisk diuresis. Restarting torsemide + metolazone.   - Renal function remained stable.  2. OSA Will need to continue CPAP at night.   3. DMII - Placed on  sliding scale.   4. Obesity   5. H/O PE - Continue xarelto 20 mg daily. Has not missed any doses of medications.  - Lower ext doppler was negative.   6. PAF In NSR today.  - Continue labetalol 400 mg twice a day  - Continue xarelto 20 mg at supper  7. HTN Urgency - SBP > 200. Improved with diuresis and home meds.   8 Chronic Hypoxic Respiratory Failure On chronic oxygen 2 liters . Sats stable.   9/ Chest Pain Chest pain over the last 24 hours. Able to localize. Tender to touch.  - HS Trop was negative.  - EKG - no ST elevation noted.  -  Continue lipitor.   Discharge Weight: 400 pounds Discharge Vitals: Blood pressure (!) 136/91, pulse 93, temperature 98.5 F (36.9 C), temperature source Oral, resp. rate 12, height '5\' 11"'$  (1.803 m), weight (!) 181.4 kg, SpO2 93 %. General:  Sitting in the chair. No resp difficulty HEENT: normal Neck: supple. Difficult to assess. Carotids 2+ bilat; no bruits. No lymphadenopathy or thryomegaly appreciated. Cor: PMI nondisplaced. Regular rate & rhythm. No rubs, gallops or murmurs. Lungs: clear on 2 liters  Abdomen: obese, soft, nontender, nondistended. No hepatosplenomegaly. No bruits or masses. Good bowel sounds. Extremities: no cyanosis, clubbing, rash, edema Neuro: alert & orientedx3, cranial nerves grossly intact. moves all 4 extremities w/o difficulty. Affect pleasant  Labs: Lab Results  Component Value Date   WBC 10.2 02/24/2020   HGB 13.5 02/24/2020   HCT 46.0 02/24/2020   MCV 80.7 02/24/2020   PLT 360 02/24/2020    Recent Labs  Lab 02/24/20 1501 02/24/20 1501 02/25/20 0537  NA 137   < > 136  K 3.9   < > 3.9  CL 96*   < > 96*  CO2 32   < > 30  BUN 8   < > 11  CREATININE 1.01   < > 1.14  CALCIUM 8.9   < > 8.7*  PROT 7.8  --   --   BILITOT  0.6  --   --   ALKPHOS 51  --   --   ALT 18  --   --   AST 24  --   --   GLUCOSE 99   < > 119*   < > = values in this interval not displayed.   Lab Results  Component Value Date   CHOL 174 01/17/2020   HDL 47 01/17/2020   LDLCALC 110 (H) 01/17/2020   TRIG 92 01/17/2020   BNP (last 3 results) Recent Labs    08/16/19 0849 08/22/19 1353 02/24/20 1501  BNP 13.9 42.8 75.1    ProBNP (last 3 results) No results for input(s): PROBNP in the last 8760 hours.   Diagnostic Studies/Procedures   DG Chest Portable 1 View  Result Date: 02/24/2020 CLINICAL DATA:  Shortness of breath EXAM: PORTABLE CHEST 1 VIEW COMPARISON:  08/13/2019 FINDINGS: 2 frontal views of the chest demonstrate stable enlargement of the cardiac silhouette.  There is central vascular congestion without focal consolidation, effusion, or pneumothorax. No acute bony abnormalities. IMPRESSION: 1. Enlarged cardiac silhouette. 2. Central vascular congestion without overt edema. Electronically Signed   By: Randa Ngo M.D.   On: 02/24/2020 15:18    Discharge Medications   Allergies as of 02/25/2020   No Known Allergies      Medication List     TAKE these medications    albuterol 108 (90 Base) MCG/ACT inhaler Commonly known as: VENTOLIN HFA Inhale 2 puffs into the lungs every 6 (six) hours as needed for wheezing or shortness of breath.   amLODipine 10 MG tablet Commonly known as: NORVASC Take 1 tablet (10 mg total) by mouth daily.   atorvastatin 40 MG tablet Commonly known as: LIPITOR Take 1 tablet (40 mg total) by mouth at bedtime.   Blood Pressure Monitor Devi Please provide patient with insurance approved blood pressure device with L-XL cuff. BMI 55 What changed:  how much to take how to take this when to take this   carboxymethylcellulose 0.5 % Soln Commonly known as: REFRESH PLUS Place 1 drop into both eyes daily.   docusate sodium 100 MG capsule Commonly known as: COLACE Take 100 mg by mouth 2 (two) times daily.   ferrous sulfate 325 (65 FE) MG tablet Take 1 tablet (325 mg total) by mouth daily. Patient will pick up scripts today. What changed: additional instructions   labetalol 200 MG tablet Commonly known as: NORMODYNE Take 2 tablets (400 mg total) by mouth 2 (two) times daily.   losartan 100 MG tablet Commonly known as: Cozaar Take 1 tablet (100 mg total) by mouth daily. What changed: when to take this   magnesium oxide 400 MG tablet Commonly known as: MAG-OX Take 2 tablets (800 mg total) by mouth every morning AND 1 tablet (400 mg total) every evening.   metFORMIN 500 MG tablet Commonly known as: GLUCOPHAGE Take 1 tablet (500 mg total) by mouth 2 (two) times daily with a meal. Patient will pick up scripts  today.   metolazone 5 MG tablet Commonly known as: ZAROXOLYN TAKE 1 TABLET EVERY MONDAY, WEDNESDAY, AND FRIDAY. What changed: See the new instructions.   Misc. Devices Misc Requires O2 @ 3L/min continuously via nasal canula and home fill system,. What changed:  how much to take how to take this when to take this   Misc. Devices Misc Please provide Mr. Denardo with insurance approved portable O2 concentrator ICD 10 J96.11 Z99.81 What changed:  how much to take how to  take this when to take this   omeprazole 20 MG capsule Commonly known as: PRILOSEC Take 1 capsule (20 mg total) by mouth 2 (two) times daily before a meal. Take first capsule 30 min prior to eating or taking other medications.   potassium chloride 10 MEQ tablet Commonly known as: KLOR-CON Take 60 meq (6 tabs) in the morning, 60 meq (6 tabs) in the afternoon and 40 meq (4 tabs) in the evening.  Take an extra 4 tabs every dose of metolazone What changed:  how much to take how to take this when to take this   rivaroxaban 20 MG Tabs tablet Commonly known as: XARELTO Take 1 tablet (20 mg total) by mouth daily with supper.   spironolactone 50 MG tablet Commonly known as: ALDACTONE Take 1 tablet (50 mg total) by mouth daily.   torsemide 100 MG tablet Commonly known as: DEMADEX Take 1 tablet (100 mg total) by mouth 2 (two) times daily.   True Metrix Blood Glucose Test test strip Generic drug: glucose blood Use as instructed. Check blood glucose level by fingerstick twice per day. What changed:  how much to take how to take this when to take this   True Metrix Meter w/Device Kit Use as instructed. Check blood glucose level by fingerstick twice per day. What changed:  how much to take how to take this when to take this   TRUEplus Lancets 28G Misc Use as instructed. Check blood glucose level by fingerstick twice per day. What changed:  how much to take how to take this when to take this         Disposition   The patient will be discharged in stable condition to home. Discharge Instructions     (HEART FAILURE PATIENTS) Call MD:  Anytime you have any of the following symptoms: 1) 3 pound weight gain in 24 hours or 5 pounds in 1 week 2) shortness of breath, with or without a dry hacking cough 3) swelling in the hands, feet or stomach 4) if you have to sleep on extra pillows at night in order to breathe.   Complete by: As directed    Diet - low sodium heart healthy   Complete by: As directed    Heart Failure patients record your daily weight using the same scale at the same time of day   Complete by: As directed    Increase activity slowly   Complete by: As directed       Follow-up Information     [facility] on 03/02/2020.   Specialty: Cardiology Why: at 1200 Contact information: [address] [id number] [place] [phone]             Duration of Discharge Encounter: Greater than 35 minutes   Signed, Darrick Grinder NP-C  02/25/2020, 10:45 AM  Agree with the above. CHF exacerbation due to dietary indiscretion.  Discharge today on torsemide 100 mg bid + home metolazone dosing.   Loralie Champagne 02/25/2020

## 2020-02-25 NOTE — ED Notes (Signed)
SDU  Breakfast ordered  

## 2020-02-25 NOTE — ED Notes (Signed)
Lunch Tray Ordered @ 1058. 

## 2020-02-26 ENCOUNTER — Other Ambulatory Visit (HOSPITAL_COMMUNITY): Payer: Self-pay

## 2020-02-26 NOTE — Progress Notes (Signed)
I went to see pt post d/c from hosp, he got home yesterday evening, said he had pills to take for last night and this morning.  However he should not have had any pills left to take, so he has missed several doses of his meds this past week which put him in the position of the other day of needing to go to hosp. He is aware of this and hopes it wont happen again. He also states that he had increased salt intake as well and is aware of how that is a problem and will work on that as well.   meds verified and pill box refilled.  He also wanted me to help with him making a payment to [facility] on one of his bills and $30 was due immediately, he was able to pay $18 on that.    Kerry Hough, EMT-Paramedic  02/26/20

## 2020-03-02 ENCOUNTER — Encounter (HOSPITAL_COMMUNITY): Payer: Self-pay

## 2020-03-02 ENCOUNTER — Other Ambulatory Visit: Payer: Self-pay

## 2020-03-02 ENCOUNTER — Other Ambulatory Visit (HOSPITAL_COMMUNITY): Payer: Self-pay

## 2020-03-02 ENCOUNTER — Ambulatory Visit (HOSPITAL_COMMUNITY)
Admission: RE | Admit: 2020-03-02 | Discharge: 2020-03-02 | Disposition: A | Discharge status: Home / Self Care | Payer: Medicaid Other | Source: Ambulatory Visit | Attending: Adult Health | Admitting: Adult Health

## 2020-03-02 VITALS — BP 190/114 | HR 99 | Wt >= 6400 oz

## 2020-03-02 DIAGNOSIS — I480 Paroxysmal atrial fibrillation: Secondary | ICD-10-CM | POA: Diagnosis not present

## 2020-03-02 DIAGNOSIS — Z87891 Personal history of nicotine dependence: Secondary | ICD-10-CM | POA: Insufficient documentation

## 2020-03-02 DIAGNOSIS — K219 Gastro-esophageal reflux disease without esophagitis: Secondary | ICD-10-CM | POA: Diagnosis not present

## 2020-03-02 DIAGNOSIS — Z7984 Long term (current) use of oral hypoglycemic drugs: Secondary | ICD-10-CM | POA: Insufficient documentation

## 2020-03-02 DIAGNOSIS — G4733 Obstructive sleep apnea (adult) (pediatric): Secondary | ICD-10-CM | POA: Insufficient documentation

## 2020-03-02 DIAGNOSIS — E119 Type 2 diabetes mellitus without complications: Secondary | ICD-10-CM | POA: Diagnosis not present

## 2020-03-02 DIAGNOSIS — Z8249 Family history of ischemic heart disease and other diseases of the circulatory system: Secondary | ICD-10-CM | POA: Insufficient documentation

## 2020-03-02 DIAGNOSIS — Z86711 Personal history of pulmonary embolism: Secondary | ICD-10-CM | POA: Insufficient documentation

## 2020-03-02 DIAGNOSIS — I10 Essential (primary) hypertension: Secondary | ICD-10-CM | POA: Diagnosis not present

## 2020-03-02 DIAGNOSIS — I110 Hypertensive heart disease with heart failure: Secondary | ICD-10-CM | POA: Diagnosis not present

## 2020-03-02 DIAGNOSIS — Z6841 Body Mass Index (BMI) 40.0 and over, adult: Secondary | ICD-10-CM | POA: Insufficient documentation

## 2020-03-02 DIAGNOSIS — I5032 Chronic diastolic (congestive) heart failure: Secondary | ICD-10-CM | POA: Diagnosis not present

## 2020-03-02 DIAGNOSIS — Z833 Family history of diabetes mellitus: Secondary | ICD-10-CM | POA: Diagnosis not present

## 2020-03-02 DIAGNOSIS — Z7901 Long term (current) use of anticoagulants: Secondary | ICD-10-CM | POA: Insufficient documentation

## 2020-03-02 DIAGNOSIS — J9611 Chronic respiratory failure with hypoxia: Secondary | ICD-10-CM | POA: Diagnosis not present

## 2020-03-02 DIAGNOSIS — I5022 Chronic systolic (congestive) heart failure: Secondary | ICD-10-CM

## 2020-03-02 DIAGNOSIS — Z8616 Personal history of COVID-19: Secondary | ICD-10-CM | POA: Diagnosis not present

## 2020-03-02 DIAGNOSIS — Z79899 Other long term (current) drug therapy: Secondary | ICD-10-CM | POA: Diagnosis not present

## 2020-03-02 DIAGNOSIS — Z9981 Dependence on supplemental oxygen: Secondary | ICD-10-CM | POA: Insufficient documentation

## 2020-03-02 LAB — BASIC METABOLIC PANEL
Anion gap: 8 (ref 5–15)
BUN: 9 mg/dL (ref 6–20)
CO2: 28 mmol/L (ref 22–32)
Calcium: 8.8 mg/dL — ABNORMAL LOW (ref 8.9–10.3)
Chloride: 101 mmol/L (ref 98–111)
Creatinine, Ser: 1.09 mg/dL (ref 0.61–1.24)
GFR calc Af Amer: >60 mL/min (ref >60)
GFR calc non Af Amer: >60 mL/min (ref >60)
Glucose, Bld: 89 mg/dL (ref 70–99)
Potassium: 3.8 mmol/L (ref 3.5–5.1)
Sodium: 137 mmol/L (ref 135–145)

## 2020-03-02 MED ORDER — DOXAZOSIN MESYLATE 1 MG PO TABS
1.0000 mg | ORAL_TABLET | Freq: Every day | ORAL | 11 refills | Status: DC
Start: 2020-03-02 — End: 2020-07-02

## 2020-03-02 NOTE — Progress Notes (Signed)
Advanced Heart Failure Clinic Note   Referring Physician: PCP: Benjamin Pounds, NP PCP-Cardiologist: Benjamin Carnes, MD  [facility]: Benjamin [name]   HPI: Mr.Williamsis a 54 yo with hx of diastolic CHF, HTN, DM, Atrial fib, PE in April 2020 (on Xarelto), OSA, GERD,chronic elevation of troponin.  Admitted 01/21/2019 with increased dyspnea and had PE. He was anticoagulated.   Patient re-admitted 5/20 for syncope/cough/dyspnea, COVID+, CT positive for bilateral GGOs, admitted to [facility] and discharged after 6 days, on O2. Rec'd Cards f/u.  Patient re-admitted 02/20/19 for bright red blood per rectum, eval'ed by GI, had EGD/colon, dx'd with hemorrhoidal bleeding, discharged after 9 days on O2.He was again Covid + .   Readmitted 03/11/19 with increased shortness of breath. He did not have home oxygen. He was discharged to home the next day.  He [facility] ED[facility]23/20with lower extremity edema. COVID negative. Diuresed with lasix drip and transitioned to torsemide 40 mg twice daily. Had [place] with mild volume overload and preserved cardiac output. Discharge weight was 407.9 [name].   He was readmitted with symptomatic anemia 05/28/19 with hgb 7.1. Received 1UPRBCs. GI consulted. He started on anusol and continued on stool softner.  He was readmitted to the hospital on 07/16/19 when he presented to the ED w/ worsening symptoms and was admitted by IM for a/c diastolic CHF and treated w/ IV Lasix. Had 2 day hospital stay and was discharged on 10/29. Echo was done 10/28 and showed normal LVEF 65-70%, RV interpreted as normal but suspect significant RV dysfunction.  After diuresis w/ IV Lasix, he was placed back on home diuretic regimen, torsemide 100 mg bid. Lisinopril 5 mg was also added to regimen. AHF team was not consulted that admit.  He was seen back in clinic 07/30/19. Felt poorly. SOB at rest and worse w/ exertion. Also w/ orthopnea/PND. Significant wt gain of 20 lb up to 428 lb (dry wt ~407 lb),  w/ marked abdominal distention and LEE and poor response to IV diuretics.  Once diuresed transitioned to torsemide 100 mg twice a day. D/C 408 [name].   Zio patch on 10/20/19. No arrhythmia.   Admitted overnight with volume overload in the setting of high salt diet. Diuresed with IV lasix and discharged the next day back on torsemide 100 mg twice a day + metolazone 3 times a week.    Today he returns for post hospital follow up. Overall feeling fine. Remains SOB with exertion. Denies PND/Orthopnea. Using oxygen and CPAP every night.  Appetite ok. No fever or chills. Not weighing at home. Taking all medications. Followed closely by HF Paramedicine.    Review of Systems: All systems reviewed and negative except as per HPI.   PMH: 1. Atrial fibrillation: Paroxysmal.  2. Pulmonary embolus: 5/20.  3. OHS/OSA: He is on home oxygen during the day and uses CPAP at night.  4. Morbid obesity.  5. Chronic diastolic CHF:  - RHC (9/83): mean RA 12, PA 40/25 mean 31, mean PCWP 23, CI 2.47, PVR 1.1 WU.  - Echo (10/20): EF 65-70%, mild LVH, normal RV size and systolic function.  6. Type 2 diabetes 7. HTN 8. Rectal bleeding: ?Hemorrhoidal.  9. COVID-19 infection 7/20.  1-. ZIo Patch - 10/20/19 no arrhythmias  Current Outpatient Medications  Medication Sig Dispense Refill  . albuterol (VENTOLIN HFA) 108 (90 Base) MCG/ACT inhaler Inhale 2 puffs into the lungs every 6 (six) hours as needed for wheezing or shortness of breath. 8.5 g 2  . amLODipine (NORVASC)  10 MG tablet Take 1 tablet (10 mg total) by mouth daily. 90 tablet 1  . atorvastatin (LIPITOR) 40 MG tablet Take 1 tablet (40 mg total) by mouth at bedtime. 90 tablet 2  . Blood Glucose Monitoring Suppl (TRUE METRIX METER) w/Device KIT Use as instructed. Check blood glucose level by fingerstick twice per day. (Patient taking differently: 1 each by Other route See admin instructions. Use as instructed. Check blood glucose level by fingerstick twice per  day.) 1 kit 0  . Blood Pressure Monitor DEVI Please provide patient with insurance approved blood pressure device with L-XL cuff. BMI 55 (Patient taking differently: 1 each by Other route See admin instructions. Please provide patient with insurance approved blood pressure device with L-XL cuff. BMI 55) 1 each 0  . carboxymethylcellulose (REFRESH PLUS) 0.5 % SOLN Place 1 drop into both eyes daily.     Marland Kitchen docusate sodium (COLACE) 100 MG capsule Take 100 mg by mouth 2 (two) times daily.    . ferrous sulfate 325 (65 FE) MG tablet Take 1 tablet (325 mg total) by mouth daily. Patient will pick up scripts today. (Patient taking differently: Take 325 mg by mouth daily. ) 90 tablet 0  . glucose blood (TRUE METRIX BLOOD GLUCOSE TEST) test strip Use as instructed. Check blood glucose level by fingerstick twice per day. (Patient taking differently: 1 each by Other route See admin instructions. Use as instructed. Check blood glucose level by fingerstick twice per day.) 100 each 12  . labetalol (NORMODYNE) 200 MG tablet Take 2 tablets (400 mg total) by mouth 2 (two) times daily. 120 tablet 3  . losartan (COZAAR) 100 MG tablet Take 1 tablet (100 mg total) by mouth daily. (Patient taking differently: Take 100 mg by mouth at bedtime. ) 30 tablet 5  . magnesium oxide (MAG-OX) 400 MG tablet Take 2 tablets (800 mg total) by mouth every morning AND 1 tablet (400 mg total) every evening. 90 tablet 6  . metFORMIN (GLUCOPHAGE) 500 MG tablet Take 1 tablet (500 mg total) by mouth 2 (two) times daily with a meal. Patient will pick up scripts today. 180 tablet 1  . metolazone (ZAROXOLYN) 5 MG tablet TAKE 1 TABLET EVERY MONDAY, WEDNESDAY, AND FRIDAY. (Patient taking differently: Take 5 mg by mouth See admin instructions. Take 1 tablet every Monday, Wednesday, and Friday.) 15 tablet 3  . Misc. Devices MISC Requires O2 @ 3L/min continuously via nasal canula and home fill system,. (Patient taking differently: 1 each by Other route See  admin instructions. Requires O2 @ 3L/min continuously via nasal canula and home fill system,.) 1 each 0  . Misc. Devices MISC Please provide Mr. Wymer with insurance approved portable O2 concentrator ICD 10 J96.11 [phone] (Patient taking differently: 1 each by Other route See admin instructions. Please provide Mr. Deguire with insurance approved portable O2 concentrator ICD 10 J96.11 Z99.81) 1 each 0  . potassium chloride SA (KLOR-CON) 10 MEQ tablet Take 60 meq (6 tabs) in the morning, 60 meq (6 tabs) in the afternoon and 40 meq (4 tabs) in the evening.  Take an extra 4 tabs every dose of metolazone (Patient taking differently: Take 40-60 mEq by mouth See admin instructions. Take 60 meq (6 tabs) in the morning, 60 meq (6 tabs) in the afternoon and 40 meq (4 tabs) in the evening.  Take an extra 4 tabs every dose of metolazone) 540 tablet 3  . rivaroxaban (XARELTO) 20 MG TABS tablet Take 1 tablet (20 mg total) by  mouth daily with supper. 60 tablet 6  . spironolactone (ALDACTONE) 50 MG tablet Take 1 tablet (50 mg total) by mouth daily. 30 tablet 3  . torsemide (DEMADEX) 100 MG tablet Take 1 tablet (100 mg total) by mouth 2 (two) times daily. 180 tablet 3  . TRUEplus Lancets 28G MISC Use as instructed. Check blood glucose level by fingerstick twice per day. (Patient taking differently: 1 each by Other route See admin instructions. Use as instructed. Check blood glucose level by fingerstick twice per day.) 200 each 3  . omeprazole (PRILOSEC) 20 MG capsule Take 1 capsule (20 mg total) by mouth 2 (two) times daily before a meal. Take first capsule 30 min prior to eating or taking other medications. 60 capsule 2   No current facility-administered medications for this encounter.    No Known Allergies    Social History   Socioeconomic History  . Marital status: Single    Spouse name: Not on file  . Number of children: Not on file  . Years of education: Not on file  . Highest education level: Not on  file  Occupational History  . Not on file  Tobacco Use  . Smoking status: Former Research scientist (life sciences)  . Smokeless tobacco: Never Used  Vaping Use  . Vaping Use: Never used  Substance and Sexual Activity  . Alcohol use: Not Currently  . Drug use: Not Currently  . Sexual activity: Not Currently  Other Topics Concern  . Not on file  Social History Narrative  . Not on file   Social Determinants of Health   Financial Resource Strain: Medium Risk  . Difficulty of Paying Living Expenses: Somewhat hard  Food Insecurity: Food Insecurity Present  . Worried About Charity fundraiser in the Last Year: Sometimes true  . Ran Out of Food in the Last Year: Sometimes true  Transportation Needs: No Transportation Needs  . Lack of Transportation (Medical): No  . Lack of Transportation (Non-Medical): No  Physical Activity:   . Days of Exercise per Week:   . Minutes of Exercise per Session:   Stress:   . Feeling of Stress :   Social Connections:   . Frequency of Communication with Friends and Family:   . Frequency of Social Gatherings with Friends and Family:   . Attends Religious Services:   . Active Member of Clubs or Organizations:   . Attends Archivist Meetings:   Marland Kitchen Marital Status:   Intimate Partner Violence:   . Fear of Current or Ex-Partner:   . Emotionally Abused:   Marland Kitchen Physically Abused:   . Sexually Abused:       Family History  Problem Relation Age of Onset  . Hypertension Mother   . Diabetes Mother     Vitals:   03/02/20 1216  BP: (!) 190/114  Pulse: 99  SpO2: 95%  Weight: (!) 182.6 kg (402 lb 9.6 oz)   Wt Readings from Last 3 Encounters:  03/02/20 (!) 182.6 kg (402 lb 9.6 oz)  02/24/20 (!) 181.4 kg (400 lb)  02/17/20 (!) 181.4 kg (400 lb)     PHYSICAL EXAM: General: Walked in the clinic.  No resp difficulty HEENT: normal Neck: supple. JVP 7-8 . Carotids 2+ bilat; no bruits. No lymphadenopathy or thryomegaly appreciated. Cor: PMI nondisplaced. Regular rate &  rhythm. No rubs, gallops or murmurs. Lungs: clear Abdomen: obese, soft, nontender, nondistended. No hepatosplenomegaly. No bruits or masses. Good bowel sounds. Extremities: no cyanosis, clubbing, rash, R and LLE trace  edema Neuro: alert & orientedx3, cranial nerves grossly intact. moves all 4 extremities w/o difficulty. Affect pleasant    ASSESSMENT & PLAN:  1. Chronic diastolic CHF: Echo in 54/88 with EF 65-70%, RV interpreted as normal but suspect significant RV dysfunction.  -NYHA III. Volume status stable.  - Continue torsemide 100 mg bid and metolazone 2.5 mg three times a week.   -Continue spironolactone to 50 mg daily.  - Check BMET today.  2. Atrial fibrillation: Paroxysmal.  - Regular on exam No bleeding issues.   - Continue Xarelto.  3. H/o PE: 5/20, continue Xarelto.  4. OHS/OSA: Continue CPAP at night and oxygen during the day. - Reinforced daily use.  5. HTN: Elevated. Add 1 mg cardura daily.  6. "Falling out" spells:  Resolved using CPAP.  Zio Patch  No  arrhythmias  7. Obesity Body mass index is 56.15 kg/m.  Plan to restart PREP execise program.   HF Paramedicine following closely. Discussed medication changes today. Check BMET today.     Emory Gallentine NP-C  03/02/2020 12:27 PM

## 2020-03-02 NOTE — Progress Notes (Signed)
Paramedicine Encounter   Patient ID: Benjamin [name] , male,   DOB: Nov 06, 1965,54 y.o.,  MRN: [id number]   Met patient in clinic today with provider.  B/p-190/114 B/p recheck- p-99 Weight @ clinic-402 sp02-95   Pt reports his breathing has improved.  He did take his meds this morning.  Pt reports [employer] called him reporting he needs to turn in his 02 tank. [name] is aware of this and she is working on the solution.  He had popcorn and cheese sandwich this morning.  B/p still elevated on recheck-cardura 44m daily is being added.  I will p/u and take it to him today.  Will have to go out there anyways and fill his pill box.  Labs done today.   KMarylouise Stacks E[place]stableville6/14/2021

## 2020-03-02 NOTE — Progress Notes (Addendum)
CSW met with pt during clinic appt to check in.  Pt doing ok but has heard from [facility] who provides his O2 at home that insurance has not paid them and he owes $1000 or they will pick up the equipment.  CSW called [employer] and spoke with a rep, Estill Bamberg, regarding the situation.  She confirmed that they would like to repossess the equipment if they do not get certain forms sent to them by Wednesday of this week- they need a receipt of pickup from [employer] as well as the RN certificate of medical necessity that [employer] had been given to to initiate oxygen.  CSW able to work with rep at [employer] and get both CMN and pickup receipt form and fax to [facility] for review- confirmed with rep that it was received and that this will stop the process of them picking up the equipment- encouraged them to call me to assist moving forward if they needed anything else  Will continue to follow and assist as needed  [name], [facility] Desk#: [phone] Cell#: [phone]

## 2020-03-02 NOTE — Progress Notes (Signed)
Came out for med rec post clinic visit.  [name] did not have cardura in stock, he had to order it and I will p/u tomor afternoon and take it to pt sometime.  I also asked him to refill : amlodipine, atorvastatin, magnesium, xarelto.   Kerry Hough, EMT-Paramedic  03/02/20

## 2020-03-02 NOTE — Patient Instructions (Signed)
START Cardura 1 mg, one tab daily  Labs today We will only contact you if something comes back abnormal or we need to make some changes. Otherwise no news is good news!  Your physician recommends that you schedule a follow-up appointment in: 1 month  in the Advanced Practitioners (PA/NP) Clinic    Do the following things EVERYDAY: 1) Weigh yourself in the morning before breakfast. Write it down and keep it in a log. 2) Take your medicines as prescribed 3) Eat low salt foods--Limit salt (sodium) to 2000 mg per day.  4) Stay as active as you can everyday 5) Limit all fluids for the day to less than 2 liters

## 2020-03-03 ENCOUNTER — Other Ambulatory Visit (HOSPITAL_COMMUNITY): Payer: Self-pay

## 2020-03-03 NOTE — Progress Notes (Signed)
Came out today after I p/u his cardura from pharmacy to place in pill box.  He will start that med tomor due to the lateness in the day today.  He went to exercise class and says he felt good.   Kerry Hough, EMT-Paramedic  03/03/20

## 2020-03-04 ENCOUNTER — Telehealth (HOSPITAL_COMMUNITY): Payer: Self-pay | Admitting: Licensed Clinical Social Worker

## 2020-03-04 NOTE — Telephone Encounter (Signed)
CSW received call from pt [facility] case worker, Delice Bison, to review pt progress and med list.  CSW provided case worker with updates and confirmed pt current medications.  Delice Bison is working to get pt a diabetic eye exam at this time and also mentioned looking into possible legal assistance for pt criminal record so it won't be as much of a barrier to housing.  Pt also trying to get membership at the [facility] so he can continue to work out despite end of [employer] program- CSW printed financial scholarship application for the [employer] and [employer] paramedic to bring out to pt to fill out and sign.  CSW will continue to follow and assist as needed  [name], LCSW Clinical Social Worker [facility] Desk#: [phone] Cell#: [phone]

## 2020-03-05 ENCOUNTER — Other Ambulatory Visit: Payer: Self-pay | Admitting: Family Medicine

## 2020-03-09 ENCOUNTER — Telehealth (HOSPITAL_COMMUNITY): Payer: Self-pay

## 2020-03-09 NOTE — Telephone Encounter (Signed)
Called pt today regarding home visit appointment time, he reports he has been in [place] the past few days b/c his mom died the other day.  He has been without his meds the past few days, we will work out something to get him his meds though.   Kerry Hough, EMT-Paramedic  03/09/20

## 2020-03-10 ENCOUNTER — Other Ambulatory Visit (HOSPITAL_COMMUNITY): Payer: Self-pay

## 2020-03-10 NOTE — Progress Notes (Signed)
Paramedicine Encounter    Patient ID: Benjamin [name], male    DOB: 11/05/1965, 54 y.o.   MRN: [id number]   Patient Care Team: [name], NP as PCP - General (Nurse Practitioner) [name], MD as PCP - Cardiology (Cardiology) [name], LCSW as Social Worker (Licensed Clinical Social Worker)  Patient Active Problem List   Diagnosis Date Noted  . Acute on chronic diastolic heart failure ([place]) 02/24/2020  . Diabetic neuropathy ([place]) 11/20/2019  . Coagulation disorder ([place]) 11/20/2019  . Acute on chronic diastolic CHF (congestive heart failure), NYHA class 3 ([place]) 07/30/2019  . Diabetes mellitus without complication ([place]) 28/78/6767  . Hypertensive emergency   . BRBPR (bright red blood per rectum) 06/12/2019  . Hypertension   . CHF (congestive heart failure) ([place])   . Sleep apnea   . Chronic respiratory failure ([place])   . Hypokalemia 05/29/2019  . GI bleed 05/29/2019  . Anemia 05/28/2019  . Acute on chronic diastolic CHF (congestive heart failure) ([place]) 04/11/2019  . Iron deficiency anemia 03/11/2019  . OSA on CPAP 03/11/2019  . HLD (hyperlipidemia) 03/11/2019  . Elevated troponin 03/11/2019  . GERD (gastroesophageal reflux disease) 03/11/2019  . Rectal bleeding 02/20/2019  . Dyspnea 02/06/2019  . COVID-19 virus infection 02/06/2019  . Bilateral lower extremity edema   . Morbid obesity with BMI of 50.0-59.9, adult ([name])   . Chronic diastolic CHF (congestive heart failure) ([place])   . PAF (paroxysmal atrial fibrillation) ([place])   . PE (pulmonary thromboembolism) ([place]) 01/21/2019  . Hypertensive urgency 01/21/2019  . Diabetes mellitus type 2 in obese ([place]) 01/21/2019    Current Outpatient Medications:  .  amLODipine (NORVASC) 10 MG tablet, Take 1 tablet (10 mg total) by mouth daily., Disp: 90 tablet, Rfl: 1 .  atorvastatin (LIPITOR) 40 MG tablet, Take 1 tablet (40 mg total) by mouth at bedtime., Disp: 90 tablet, Rfl: 2 .  Blood Glucose Monitoring Suppl (TRUE  METRIX METER) w/Device KIT, Use as instructed. Check blood glucose level by fingerstick twice per day. (Patient taking differently: 1 each by Other route See admin instructions. Use as instructed. Check blood glucose level by fingerstick twice per day.), Disp: 1 kit, Rfl: 0 .  Blood Pressure Monitor DEVI, Please provide patient with insurance approved blood pressure device with L-XL cuff. BMI 55 (Patient taking differently: 1 each by Other route See admin instructions. Please provide patient with insurance approved blood pressure device with L-XL cuff. BMI 55), Disp: 1 each, Rfl: 0 .  carboxymethylcellulose (REFRESH PLUS) 0.5 % SOLN, Place 1 drop into both eyes daily. , Disp: , Rfl:  .  docusate sodium (COLACE) 100 MG capsule, Take 100 mg by mouth 2 (two) times daily., Disp: , Rfl:  .  doxazosin (CARDURA) 1 MG tablet, Take 1 tablet (1 mg total) by mouth daily., Disp: 30 tablet, Rfl: 11 .  ferrous sulfate 325 (65 FE) MG tablet, Take 1 tablet (325 mg total) by mouth daily. Patient will pick up scripts today. (Patient taking differently: Take 325 mg by mouth daily. ), Disp: 90 tablet, Rfl: 0 .  glucose blood (TRUE METRIX BLOOD GLUCOSE TEST) test strip, Use as instructed. Check blood glucose level by fingerstick twice per day. (Patient taking differently: 1 each by Other route See admin instructions. Use as instructed. Check blood glucose level by fingerstick twice per day.), Disp: 100 each, Rfl: 12 .  labetalol (NORMODYNE) 200 MG tablet, Take 2 tablets (400 mg total) by mouth 2 (two) times daily.,  Disp: 120 tablet, Rfl: 3 .  losartan (COZAAR) 100 MG tablet, Take 1 tablet (100 mg total) by mouth daily. (Patient taking differently: Take 100 mg by mouth at bedtime. ), Disp: 30 tablet, Rfl: 5 .  magnesium oxide (MAG-OX) 400 MG tablet, Take 2 tablets (800 mg total) by mouth every morning AND 1 tablet (400 mg total) every evening., Disp: 90 tablet, Rfl: 6 .  metFORMIN (GLUCOPHAGE) 500 MG tablet, Take 1 tablet (500  mg total) by mouth 2 (two) times daily with a meal. Patient will pick up scripts today., Disp: 180 tablet, Rfl: 1 .  metolazone (ZAROXOLYN) 5 MG tablet, TAKE 1 TABLET EVERY MONDAY, WEDNESDAY, AND FRIDAY. (Patient taking differently: Take 5 mg by mouth See admin instructions. Take 1 tablet every Monday, Wednesday, and Friday.), Disp: 15 tablet, Rfl: 3 .  Misc. Devices MISC, Requires O2 @ 3L/min continuously via nasal canula and home fill system,. (Patient taking differently: 1 each by Other route See admin instructions. Requires O2 @ 3L/min continuously via nasal canula and home fill system,.), Disp: 1 each, Rfl: 0 .  Misc. Devices MISC, Please provide [name] with insurance approved portable O2 concentrator ICD 10 J96.11 [phone] (Patient taking differently: 1 each by Other route See admin instructions. Please provide [name] with insurance approved portable O2 concentrator ICD 10 J96.11 Z99.81), Disp: 1 each, Rfl: 0 .  potassium chloride SA (KLOR-CON) 10 MEQ tablet, Take 60 meq (6 tabs) in the morning, 60 meq (6 tabs) in the afternoon and 40 meq (4 tabs) in the evening.  Take an extra 4 tabs every dose of metolazone (Patient taking differently: Take 40-60 mEq by mouth See admin instructions. Take 60 meq (6 tabs) in the morning, 60 meq (6 tabs) in the afternoon and 40 meq (4 tabs) in the evening.  Take an extra 4 tabs every dose of metolazone), Disp: 540 tablet, Rfl: 3 .  PROVENTIL HFA 108 (90 Base) MCG/ACT inhaler, INHALE 2 PUFFS BY MOUTH EVERY 6 (SIX) HOURS AS NEEDED FOR WHEEZING OR SHORTNESS OF BREATH., Disp: 6.7 g, Rfl: 2 .  rivaroxaban (XARELTO) 20 MG TABS tablet, Take 1 tablet (20 mg total) by mouth daily with supper., Disp: 60 tablet, Rfl: 6 .  spironolactone (ALDACTONE) 50 MG tablet, Take 1 tablet (50 mg total) by mouth daily., Disp: 30 tablet, Rfl: 3 .  torsemide (DEMADEX) 100 MG tablet, Take 1 tablet (100 mg total) by mouth 2 (two) times daily., Disp: 180 tablet, Rfl: 3 .  TRUEplus Lancets  28G MISC, Use as instructed. Check blood glucose level by fingerstick twice per day. (Patient taking differently: 1 each by Other route See admin instructions. Use as instructed. Check blood glucose level by fingerstick twice per day.), Disp: 200 each, Rfl: 3 .  omeprazole (PRILOSEC) 20 MG capsule, Take 1 capsule (20 mg total) by mouth 2 (two) times daily before a meal. Take first capsule 30 min prior to eating or taking other medications., Disp: 60 capsule, Rfl: 2 No Known Allergies    Social History   Socioeconomic History  . Marital status: Single    Spouse name: Not on file  . Number of children: Not on file  . Years of education: Not on file  . Highest education level: Not on file  Occupational History  . Not on file  Tobacco Use  . Smoking status: Former Research scientist (life sciences)  . Smokeless tobacco: Never Used  Vaping Use  . Vaping Use: Never used  Substance and Sexual Activity  .  Alcohol use: Not Currently  . Drug use: Not Currently  . Sexual activity: Not Currently  Other Topics Concern  . Not on file  Social History Narrative  . Not on file   Social Determinants of Health   Financial Resource Strain: Medium Risk  . Difficulty of Paying Living Expenses: Somewhat hard  Food Insecurity: Food Insecurity Present  . Worried About Charity fundraiser in the Last Year: Sometimes true  . Ran Out of Food in the Last Year: Sometimes true  Transportation Needs: No Transportation Needs  . Lack of Transportation (Medical): No  . Lack of Transportation (Non-Medical): No  Physical Activity:   . Days of Exercise per Week:   . Minutes of Exercise per Session:   Stress:   . Feeling of Stress :   Social Connections:   . Frequency of Communication with Friends and Family:   . Frequency of Social Gatherings with Friends and Family:   . Attends Religious Services:   . Active Member of Clubs or Organizations:   . Attends Archivist Meetings:   Marland Kitchen Marital Status:   Intimate Partner  Violence:   . Fear of Current or Ex-Partner:   . Emotionally Abused:   Marland Kitchen Physically Abused:   . Sexually Abused:     Physical Exam      Future Appointments  Date Time Provider [place]  03/19/2020  1:30 PM [facility] PA/NP [facility] None  04/17/2020 11:10 AM [name], NP [facility] None    BP (!) 118/0   Pulse 90   Temp (!) 97.3 F (36.3 C)   Resp 18   SpO2 96%    Pts mom passed away over the wknd, he has been staying in [place] helping with funeral arrangements and cleaning out his moms apt with his siblings. I was able to come see him in [place] to fill his pill box.  He had not taken his meds in several days and had began to feel bad, plus the additional stress of loss of his mom.  He did get to his place last night and got his meds and took them today. He denies sob, but he does have a pain in his stomach that comes and goes, little appetite. Drinking ok. He denies c/p, slight edema. tomor is his metolazone day. I told him to call me if he begins to feel worse. Hopefully him getting back on his meds will make him feel better. His b/p was actually a lot better today. He has not been able to weigh the past several days either.   He needs refills OQ:HUTML, labetalol, metolazone, inhaler, potassium. Will get that called in for him later this week.  Got him to sign the application for the [facility] scholarship. Will return that to [name] this week.  He plans to come back to stay in [place] this wknd. He has talked to his landlord about him getting a house of his own and it sounds like Milbert Coulter is going to help with that. Will continue to follow.   [name], [facility] Paramedic  03/11/20

## 2020-03-11 ENCOUNTER — Telehealth: Payer: Self-pay

## 2020-03-11 DIAGNOSIS — J9611 Chronic respiratory failure with hypoxia: Secondary | ICD-10-CM | POA: Diagnosis not present

## 2020-03-11 DIAGNOSIS — I5032 Chronic diastolic (congestive) heart failure: Secondary | ICD-10-CM | POA: Diagnosis not present

## 2020-03-11 NOTE — Telephone Encounter (Signed)
Call placed to [employer], spoke to [facility] and informed her that [employer] should be faxing them the O2 pick up ticket.   Message received from [facility] noting that the patient's O2 was picked up on 09/08/2019.  Provided her with the fax # for [employer] - attention: Marchelle Folks.

## 2020-03-16 ENCOUNTER — Telehealth (HOSPITAL_COMMUNITY): Payer: Self-pay | Admitting: Licensed Clinical Social Worker

## 2020-03-16 ENCOUNTER — Other Ambulatory Visit (HOSPITAL_COMMUNITY): Payer: Self-pay

## 2020-03-16 ENCOUNTER — Other Ambulatory Visit (HOSPITAL_COMMUNITY): Payer: Self-pay | Admitting: Adult Health

## 2020-03-16 NOTE — Telephone Encounter (Signed)
Community paramedic returned signed [employer] assistance application to CSW.  CSW assisted in completing application and submitted to Viann Fish with the PREP program to give to appropriate personnel for review.  CSW will continue to follow and assist as needed  [name], LCSW Clinical Social Worker [facility] Desk#: [phone] Cell#: [phone]

## 2020-03-16 NOTE — Progress Notes (Signed)
Paramedicine Encounter    Patient ID: Benjamin [name], male    DOB: 11/05/1965, 54 y.o.   MRN: [id number]   Patient Care Team: Benjamin Pounds, NP as PCP - General (Nurse Practitioner) [name], MD as PCP - Cardiology (Cardiology) Benjamin Ny, LCSW as Social Worker (Licensed Clinical Social Worker)  Patient Active Problem List   Diagnosis Date Noted  . Acute on chronic diastolic heart failure ([place]) 02/24/2020  . Diabetic neuropathy ([place]) 11/20/2019  . Coagulation disorder ([place]) 11/20/2019  . Acute on chronic diastolic CHF (congestive heart failure), NYHA class 3 ([place]) 07/30/2019  . Diabetes mellitus without complication ([place]) 28/78/6767  . Hypertensive emergency   . BRBPR (bright red blood per rectum) 06/12/2019  . Hypertension   . CHF (congestive heart failure) ([place])   . Sleep apnea   . Chronic respiratory failure ([place])   . Hypokalemia 05/29/2019  . GI bleed 05/29/2019  . Anemia 05/28/2019  . Acute on chronic diastolic CHF (congestive heart failure) ([place]) 04/11/2019  . Iron deficiency anemia 03/11/2019  . OSA on CPAP 03/11/2019  . HLD (hyperlipidemia) 03/11/2019  . Elevated troponin 03/11/2019  . GERD (gastroesophageal reflux disease) 03/11/2019  . Rectal bleeding 02/20/2019  . Dyspnea 02/06/2019  . COVID-19 virus infection 02/06/2019  . Bilateral lower extremity edema   . Morbid obesity with BMI of 50.0-59.9, adult ([name])   . Chronic diastolic CHF (congestive heart failure) ([place])   . PAF (paroxysmal atrial fibrillation) ([place])   . PE (pulmonary thromboembolism) ([place]) 01/21/2019  . Hypertensive urgency 01/21/2019  . Diabetes mellitus type 2 in obese ([place]) 01/21/2019    Current Outpatient Medications:  .  amLODipine (NORVASC) 10 MG tablet, Take 1 tablet (10 mg total) by mouth daily., Disp: 90 tablet, Rfl: 1 .  atorvastatin (LIPITOR) 40 MG tablet, Take 1 tablet (40 mg total) by mouth at bedtime., Disp: 90 tablet, Rfl: 2 .  Blood Glucose Monitoring Suppl (TRUE  METRIX METER) w/Device KIT, Use as instructed. Check blood glucose level by fingerstick twice per day. (Patient taking differently: 1 each by Other route See admin instructions. Use as instructed. Check blood glucose level by fingerstick twice per day.), Disp: 1 kit, Rfl: 0 .  Blood Pressure Monitor DEVI, Please provide patient with insurance approved blood pressure device with L-XL cuff. BMI 55 (Patient taking differently: 1 each by Other route See admin instructions. Please provide patient with insurance approved blood pressure device with L-XL cuff. BMI 55), Disp: 1 each, Rfl: 0 .  carboxymethylcellulose (REFRESH PLUS) 0.5 % SOLN, Place 1 drop into both eyes daily. , Disp: , Rfl:  .  docusate sodium (COLACE) 100 MG capsule, Take 100 mg by mouth 2 (two) times daily., Disp: , Rfl:  .  doxazosin (CARDURA) 1 MG tablet, Take 1 tablet (1 mg total) by mouth daily., Disp: 30 tablet, Rfl: 11 .  ferrous sulfate 325 (65 FE) MG tablet, Take 1 tablet (325 mg total) by mouth daily. Patient will pick up scripts today. (Patient taking differently: Take 325 mg by mouth daily. ), Disp: 90 tablet, Rfl: 0 .  glucose blood (TRUE METRIX BLOOD GLUCOSE TEST) test strip, Use as instructed. Check blood glucose level by fingerstick twice per day. (Patient taking differently: 1 each by Other route See admin instructions. Use as instructed. Check blood glucose level by fingerstick twice per day.), Disp: 100 each, Rfl: 12 .  labetalol (NORMODYNE) 200 MG tablet, Take 2 tablets (400 mg total) by mouth 2 (two) times daily.,  Disp: 120 tablet, Rfl: 3 .  losartan (COZAAR) 100 MG tablet, Take 1 tablet (100 mg total) by mouth daily. (Patient taking differently: Take 100 mg by mouth at bedtime. ), Disp: 30 tablet, Rfl: 5 .  magnesium oxide (MAG-OX) 400 MG tablet, Take 2 tablets (800 mg total) by mouth every morning AND 1 tablet (400 mg total) every evening., Disp: 90 tablet, Rfl: 6 .  metFORMIN (GLUCOPHAGE) 500 MG tablet, Take 1 tablet (500  mg total) by mouth 2 (two) times daily with a meal. Patient will pick up scripts today., Disp: 180 tablet, Rfl: 1 .  metolazone (ZAROXOLYN) 5 MG tablet, TAKE 1 TABLET EVERY MONDAY, WEDNESDAY, AND FRIDAY. (Patient taking differently: Take 5 mg by mouth See admin instructions. Take 1 tablet every Monday, Wednesday, and Friday.), Disp: 15 tablet, Rfl: 3 .  Misc. Devices MISC, Requires O2 @ 3L/min continuously via nasal canula and home fill system,. (Patient taking differently: 1 each by Other route See admin instructions. Requires O2 @ 3L/min continuously via nasal canula and home fill system,.), Disp: 1 each, Rfl: 0 .  Misc. Devices MISC, Please provide [name] with insurance approved portable O2 concentrator ICD 10 J96.11 [phone] (Patient taking differently: 1 each by Other route See admin instructions. Please provide [name] with insurance approved portable O2 concentrator ICD 10 J96.11 Z99.81), Disp: 1 each, Rfl: 0 .  potassium chloride SA (KLOR-CON) 10 MEQ tablet, Take 60 meq (6 tabs) in the morning, 60 meq (6 tabs) in the afternoon and 40 meq (4 tabs) in the evening.  Take an extra 4 tabs every dose of metolazone (Patient taking differently: Take 40-60 mEq by mouth See admin instructions. Take 60 meq (6 tabs) in the morning, 60 meq (6 tabs) in the afternoon and 40 meq (4 tabs) in the evening.  Take an extra 4 tabs every dose of metolazone), Disp: 540 tablet, Rfl: 3 .  PROVENTIL HFA 108 (90 Base) MCG/ACT inhaler, INHALE 2 PUFFS BY MOUTH EVERY 6 (SIX) HOURS AS NEEDED FOR WHEEZING OR SHORTNESS OF BREATH., Disp: 6.7 g, Rfl: 2 .  rivaroxaban (XARELTO) 20 MG TABS tablet, Take 1 tablet (20 mg total) by mouth daily with supper., Disp: 60 tablet, Rfl: 6 .  spironolactone (ALDACTONE) 50 MG tablet, Take 1 tablet (50 mg total) by mouth daily., Disp: 30 tablet, Rfl: 3 .  torsemide (DEMADEX) 100 MG tablet, Take 1 tablet (100 mg total) by mouth 2 (two) times daily., Disp: 180 tablet, Rfl: 3 .  TRUEplus Lancets  28G MISC, Use as instructed. Check blood glucose level by fingerstick twice per day. (Patient taking differently: 1 each by Other route See admin instructions. Use as instructed. Check blood glucose level by fingerstick twice per day.), Disp: 200 each, Rfl: 3 .  omeprazole (PRILOSEC) 20 MG capsule, Take 1 capsule (20 mg total) by mouth 2 (two) times daily before a meal. Take first capsule 30 min prior to eating or taking other medications., Disp: 60 capsule, Rfl: 2 No Known Allergies    Social History   Socioeconomic History  . Marital status: Single    Spouse name: Not on file  . Number of children: Not on file  . Years of education: Not on file  . Highest education level: Not on file  Occupational History  . Not on file  Tobacco Use  . Smoking status: Former Research scientist (life sciences)  . Smokeless tobacco: Never Used  Vaping Use  . Vaping Use: Never used  Substance and Sexual Activity  .  Alcohol use: Not Currently  . Drug use: Not Currently  . Sexual activity: Not Currently  Other Topics Concern  . Not on file  Social History Narrative  . Not on file   Social Determinants of Health   Financial Resource Strain: Medium Risk  . Difficulty of Paying Living Expenses: Somewhat hard  Food Insecurity: Food Insecurity Present  . Worried About Charity fundraiser in the Last Year: Sometimes true  . Ran Out of Food in the Last Year: Sometimes true  Transportation Needs: No Transportation Needs  . Lack of Transportation (Medical): No  . Lack of Transportation (Non-Medical): No  Physical Activity:   . Days of Exercise per Week:   . Minutes of Exercise per Session:   Stress:   . Feeling of Stress :   Social Connections:   . Frequency of Communication with Friends and Family:   . Frequency of Social Gatherings with Friends and Family:   . Attends Religious Services:   . Active Member of Clubs or Organizations:   . Attends Archivist Meetings:   Marland Kitchen Marital Status:   Intimate Partner  Violence:   . Fear of Current or Ex-Partner:   . Emotionally Abused:   Marland Kitchen Physically Abused:   . Sexually Abused:     Physical Exam      Future Appointments  Date Time Provider [place]  03/19/2020  1:30 PM [facility] PA/NP [facility] None  04/17/2020 11:10 AM Benjamin Pounds, NP [facility] None    BP (!) 170/0   Pulse 88   Temp 97.6 F (36.4 C)   Resp 18   Wt (!) 396 lb (179.6 kg)   SpO2 97%   BMI 55.23 kg/m   Weight yesterday-?? Last visit weight-  Pt had episode of some bleeding for about 3 days. That has resolved. He thinks he was straining and too much stress caused it.  He is down some money, had to help pay for his mothers funeral, then this guy from back in the day he owes money to, he ran into him and that guy is expecting payment soon.  He said he will call jenna and see if [employer] or agency that will help pay his rent money.  Have not taken meds yet this morning, he will eat and then take them.  Weight is good. B/p elevated--no meds taken and he missed a few doses of his meds this past week.  Advised him to be sure to take correct day of week due to his metolazone days.  He denies sob, no dizziness, no c/p. No edema noted.  He states he is feeling better.   [name], [facility] Paramedic  03/16/20

## 2020-03-17 ENCOUNTER — Telehealth (HOSPITAL_COMMUNITY): Payer: Self-pay | Admitting: Licensed Clinical Social Worker

## 2020-03-17 ENCOUNTER — Telehealth: Payer: Self-pay

## 2020-03-17 NOTE — Telephone Encounter (Signed)
Call placed to patient reference approval for reduced amt for gym-he sts he can afford 17.50 per month. Will mail copy to patient and send copy to SW.  Asked [facility] to [facility] and I will assist with [facility]

## 2020-03-17 NOTE — Telephone Encounter (Signed)
CSW informed by PREP program coordination that pt has been approved for [employer]- will be at reduced cost of $17.50 a month.  She will call pt tomorrow to discuss   Will continue to follow and assist as needed  [name], LCSW Clinical Social Worker [facility] Desk#: [phone] Cell#: [phone]

## 2020-03-19 ENCOUNTER — Ambulatory Visit (HOSPITAL_COMMUNITY)
Admission: RE | Admit: 2020-03-19 | Discharge: 2020-03-19 | Disposition: A | Discharge status: Home / Self Care | Payer: Medicaid Other | Source: Ambulatory Visit | Attending: Internal Medicine | Admitting: Internal Medicine

## 2020-03-19 ENCOUNTER — Encounter (HOSPITAL_COMMUNITY): Payer: Self-pay

## 2020-03-19 ENCOUNTER — Other Ambulatory Visit: Payer: Self-pay

## 2020-03-19 VITALS — BP 120/70 | HR 83 | Ht 70.0 in | Wt >= 6400 oz

## 2020-03-19 DIAGNOSIS — G4733 Obstructive sleep apnea (adult) (pediatric): Secondary | ICD-10-CM | POA: Diagnosis not present

## 2020-03-19 DIAGNOSIS — Z8249 Family history of ischemic heart disease and other diseases of the circulatory system: Secondary | ICD-10-CM | POA: Insufficient documentation

## 2020-03-19 DIAGNOSIS — I110 Hypertensive heart disease with heart failure: Secondary | ICD-10-CM | POA: Diagnosis not present

## 2020-03-19 DIAGNOSIS — Z86711 Personal history of pulmonary embolism: Secondary | ICD-10-CM | POA: Diagnosis not present

## 2020-03-19 DIAGNOSIS — Z8616 Personal history of COVID-19: Secondary | ICD-10-CM | POA: Insufficient documentation

## 2020-03-19 DIAGNOSIS — Z79899 Other long term (current) drug therapy: Secondary | ICD-10-CM | POA: Diagnosis not present

## 2020-03-19 DIAGNOSIS — I10 Essential (primary) hypertension: Secondary | ICD-10-CM

## 2020-03-19 DIAGNOSIS — D649 Anemia, unspecified: Secondary | ICD-10-CM | POA: Insufficient documentation

## 2020-03-19 DIAGNOSIS — E119 Type 2 diabetes mellitus without complications: Secondary | ICD-10-CM | POA: Insufficient documentation

## 2020-03-19 DIAGNOSIS — K219 Gastro-esophageal reflux disease without esophagitis: Secondary | ICD-10-CM | POA: Insufficient documentation

## 2020-03-19 DIAGNOSIS — Z87891 Personal history of nicotine dependence: Secondary | ICD-10-CM | POA: Diagnosis not present

## 2020-03-19 DIAGNOSIS — I5032 Chronic diastolic (congestive) heart failure: Secondary | ICD-10-CM | POA: Diagnosis not present

## 2020-03-19 DIAGNOSIS — Z833 Family history of diabetes mellitus: Secondary | ICD-10-CM | POA: Diagnosis not present

## 2020-03-19 DIAGNOSIS — Z6841 Body Mass Index (BMI) 40.0 and over, adult: Secondary | ICD-10-CM | POA: Diagnosis not present

## 2020-03-19 DIAGNOSIS — Z7901 Long term (current) use of anticoagulants: Secondary | ICD-10-CM | POA: Diagnosis not present

## 2020-03-19 DIAGNOSIS — Z7984 Long term (current) use of oral hypoglycemic drugs: Secondary | ICD-10-CM | POA: Diagnosis not present

## 2020-03-19 DIAGNOSIS — I480 Paroxysmal atrial fibrillation: Secondary | ICD-10-CM | POA: Diagnosis not present

## 2020-03-19 DIAGNOSIS — Z8719 Personal history of other diseases of the digestive system: Secondary | ICD-10-CM | POA: Insufficient documentation

## 2020-03-19 DIAGNOSIS — J9611 Chronic respiratory failure with hypoxia: Secondary | ICD-10-CM | POA: Diagnosis not present

## 2020-03-19 NOTE — Progress Notes (Signed)
Advanced Heart Failure Clinic Note   Referring Physician: PCP: [name], NP PCP-Cardiologist: Dorris Carnes, MD  [facility]: Dr. Aundra Dubin   HPI: [name] a 54 yo with hx of diastolic CHF, HTN, DM, Atrial fib, PE in April 2020 (on Xarelto), OSA, GERD,chronic elevation of troponin.  Admitted 01/21/2019 with increased dyspnea and had PE. He was anticoagulated.   Patient re-admitted 5/20 for syncope/cough/dyspnea, COVID+, CT positive for bilateral GGOs, admitted to [facility] and discharged after 6 days, on O2. Rec'd Cards f/u.  Patient re-admitted 02/20/19 for bright red blood per rectum, eval'ed by GI, had EGD/colon, dx'd with hemorrhoidal bleeding, discharged after 9 days on O2.He was again Covid + .   Readmitted 03/11/19 with increased shortness of breath. He did not have home oxygen. He was discharged to home the next day.  He presented to [facility] ED7/23/20with lower extremity edema. COVID negative. Diuresed with lasix drip and transitioned to torsemide 40 mg twice daily. Had Omega with mild volume overload and preserved cardiac output. Discharge weight was 407.9 pounds.   He was readmitted with symptomatic anemia 05/28/19 with hgb 7.1. Received 1UPRBCs. GI consulted. He started on anusol and continued on stool softner.  He was readmitted to the hospital on 07/16/19 when he presented to the ED w/ worsening symptoms and was admitted by IM for a/c diastolic CHF and treated w/ IV Lasix. Had 2 day hospital stay and was discharged on 10/29. Echo was done 10/28 and showed normal LVEF 65-70%, RV interpreted as normal but suspect significant RV dysfunction.  After diuresis w/ IV Lasix, he was placed back on home diuretic regimen, torsemide 100 mg bid. Lisinopril 5 mg was also added to regimen. AHF team was not consulted that admit.  He was seen back in clinic 07/30/19. Felt poorly. SOB at rest and worse w/ exertion. Also w/ orthopnea/PND. Significant wt gain of 20 lb up to 428 lb (dry wt ~407 lb),  w/ marked abdominal distention and LEE and poor response to IV diuretics.  Once diuresed transitioned to torsemide 100 mg twice a day. D/C 408 pounds.   Zio patch on 10/20/19-No arrhythmia.   Today he returns for HF follow up. Overall feeling fine. Dealing stress from his mothers death. Remains SOB with exertion but this is his baseline. Wearing oxygen as needed. Using CPAP nightly. Denies PND/Orthopnea. Appetite ok. No fever or chills. Taking all medications. Followed by HF Paramedicine.   Labs (11/20): BNP 32, K 3.6, creatinine 1.09 Labs (12/20): K 3.3, creatinine 1.28 Labs (10/10/19): K 3.9 Creatinine 1.4  Labs (11/05/19) : K 3.2 Creatinine 1.47  Labs (12/30/2019) : K 3.5 Creatinine 1.14  Labs (03/02/2020): K 3.8 Creatinine 1.09.   Review of Systems: All systems reviewed and negative except as per HPI.   PMH: 1. Atrial fibrillation: Paroxysmal.  2. Pulmonary embolus: 5/20.  3. OHS/OSA: He is on home oxygen during the day and uses CPAP at night.  4. Morbid obesity.  5. Chronic diastolic CHF:  - RHC (1/57): mean RA 12, PA 40/25 mean 31, mean PCWP 23, CI 2.47, PVR 1.1 WU.  - Echo (10/20): EF 65-70%, mild LVH, normal RV size and systolic function.  6. Type 2 diabetes 7. HTN 8. Rectal bleeding: ?Hemorrhoidal.  9. COVID-19 infection 7/20.  1-. ZIo Patch - 10/20/19 no arrhythmias  Current Outpatient Medications  Medication Sig Dispense Refill  . amLODipine (NORVASC) 10 MG tablet Take 1 tablet (10 mg total) by mouth daily. 90 tablet 1  . atorvastatin (LIPITOR) 40 MG  tablet Take 1 tablet (40 mg total) by mouth at bedtime. 90 tablet 2  . Blood Glucose Monitoring Suppl (TRUE METRIX METER) w/Device KIT Use as instructed. Check blood glucose level by fingerstick twice per day. (Patient taking differently: 1 each by Other route See admin instructions. Use as instructed. Check blood glucose level by fingerstick twice per day.) 1 kit 0  . Blood Pressure Monitor DEVI Please provide patient with  insurance approved blood pressure device with L-XL cuff. BMI 55 (Patient taking differently: 1 each by Other route See admin instructions. Please provide patient with insurance approved blood pressure device with L-XL cuff. BMI 55) 1 each 0  . carboxymethylcellulose (REFRESH PLUS) 0.5 % SOLN Place 1 drop into both eyes daily.     Marland Kitchen docusate sodium (COLACE) 100 MG capsule Take 100 mg by mouth 2 (two) times daily.    Marland Kitchen doxazosin (CARDURA) 1 MG tablet Take 1 tablet (1 mg total) by mouth daily. 30 tablet 11  . ferrous sulfate 325 (65 FE) MG tablet Take 1 tablet (325 mg total) by mouth daily. Patient will pick up scripts today. (Patient taking differently: Take 325 mg by mouth daily. ) 90 tablet 0  . glucose blood (TRUE METRIX BLOOD GLUCOSE TEST) test strip Use as instructed. Check blood glucose level by fingerstick twice per day. (Patient taking differently: 1 each by Other route See admin instructions. Use as instructed. Check blood glucose level by fingerstick twice per day.) 100 each 12  . labetalol (NORMODYNE) 200 MG tablet Take 2 tablets (400 mg total) by mouth 2 (two) times daily. 120 tablet 3  . losartan (COZAAR) 100 MG tablet Take 1 tablet (100 mg total) by mouth daily. (Patient taking differently: Take 100 mg by mouth at bedtime. ) 30 tablet 5  . magnesium oxide (MAG-OX) 400 MG tablet Take 2 tablets (800 mg total) by mouth every morning AND 1 tablet (400 mg total) every evening. 90 tablet 6  . metFORMIN (GLUCOPHAGE) 500 MG tablet Take 1 tablet (500 mg total) by mouth 2 (two) times daily with a meal. Patient will pick up scripts today. 180 tablet 1  . metolazone (ZAROXOLYN) 5 MG tablet TAKE 1 TABLET EVERY MONDAY, WEDNESDAY, AND FRIDAY. (Patient taking differently: Take 5 mg by mouth See admin instructions. Take 1 tablet every Monday, Wednesday, and Friday.) 15 tablet 3  . Misc. Devices MISC Requires O2 @ 3L/min continuously via nasal canula and home fill system,. (Patient taking differently: 1 each by  Other route See admin instructions. Requires O2 @ 3L/min continuously via nasal canula and home fill system,.) 1 each 0  . potassium chloride SA (KLOR-CON) 10 MEQ tablet Take 60 meq (6 tabs) in the morning, 60 meq (6 tabs) in the afternoon and 40 meq (4 tabs) in the evening.  Take an extra 4 tabs every dose of metolazone (Patient taking differently: Take 40-60 mEq by mouth See admin instructions. Take 60 meq (6 tabs) in the morning, 60 meq (6 tabs) in the afternoon and 40 meq (4 tabs) in the evening.  Take an extra 4 tabs every dose of metolazone) 540 tablet 3  . PROVENTIL HFA 108 (90 Base) MCG/ACT inhaler INHALE 2 PUFFS BY MOUTH EVERY 6 (SIX) HOURS AS NEEDED FOR WHEEZING OR SHORTNESS OF BREATH. 6.7 g 2  . rivaroxaban (XARELTO) 20 MG TABS tablet Take 1 tablet (20 mg total) by mouth daily with supper. 60 tablet 6  . spironolactone (ALDACTONE) 50 MG tablet TAKE 1 TABLET (50 MG TOTAL)  BY MOUTH DAILY. 30 tablet 3  . torsemide (DEMADEX) 100 MG tablet Take 1 tablet (100 mg total) by mouth 2 (two) times daily. 180 tablet 3  . TRUEplus Lancets 28G MISC Use as instructed. Check blood glucose level by fingerstick twice per day. (Patient taking differently: 1 each by Other route See admin instructions. Use as instructed. Check blood glucose level by fingerstick twice per day.) 200 each 3  . Misc. Devices MISC Please provide Mr. Weakland with insurance approved portable O2 concentrator ICD 10 J96.11 [phone] (Patient taking differently: 1 each by Other route See admin instructions. Please provide Mr. Musgrave with insurance approved portable O2 concentrator ICD 10 J96.11 Z99.81) 1 each 0  . omeprazole (PRILOSEC) 20 MG capsule Take 1 capsule (20 mg total) by mouth 2 (two) times daily before a meal. Take first capsule 30 min prior to eating or taking other medications. 60 capsule 2   No current facility-administered medications for this encounter.    No Known Allergies    Social History   Socioeconomic History  .  Marital status: Single    Spouse name: Not on file  . Number of children: Not on file  . Years of education: Not on file  . Highest education level: Not on file  Occupational History  . Not on file  Tobacco Use  . Smoking status: Former Research scientist (life sciences)  . Smokeless tobacco: Never Used  Vaping Use  . Vaping Use: Never used  Substance and Sexual Activity  . Alcohol use: Not Currently  . Drug use: Not Currently  . Sexual activity: Not Currently  Other Topics Concern  . Not on file  Social History Narrative  . Not on file   Social Determinants of Health   Financial Resource Strain: Medium Risk  . Difficulty of Paying Living Expenses: Somewhat hard  Food Insecurity: Food Insecurity Present  . Worried About Charity fundraiser in the Last Year: Sometimes true  . Ran Out of Food in the Last Year: Sometimes true  Transportation Needs: No Transportation Needs  . Lack of Transportation (Medical): No  . Lack of Transportation (Non-Medical): No  Physical Activity:   . Days of Exercise per Week:   . Minutes of Exercise per Session:   Stress:   . Feeling of Stress :   Social Connections:   . Frequency of Communication with Friends and Family:   . Frequency of Social Gatherings with Friends and Family:   . Attends Religious Services:   . Active Member of Clubs or Organizations:   . Attends Archivist Meetings:   Marland Kitchen Marital Status:   Intimate Partner Violence:   . Fear of Current or Ex-Partner:   . Emotionally Abused:   Marland Kitchen Physically Abused:   . Sexually Abused:       Family History  Problem Relation Age of Onset  . Hypertension Mother   . Diabetes Mother     Vitals:   03/19/20 1326  Pulse: 83  SpO2: (!) 86%  Weight: (!) 181.9 kg (401 lb)  Height: _0  (1.778 m)   Wt Readings from Last 3 Encounters:  03/19/20 (!) 181.9 kg (401 lb)  03/16/20 (!) 179.6 kg (396 lb)  03/02/20 (!) 182.6 kg (402 lb 9.6 oz)     PHYSICAL EXAM: General:  Well appearing. No resp  difficulty. Walks slowly in the clinic  HEENT: normal Neck: supple. no JVD. Carotids 2+ bilat; no bruits. No lymphadenopathy or thryomegaly appreciated. Cor: PMI nondisplaced. Regular rate &  rhythm. No rubs, gallops or murmurs. Lungs: clear Abdomen: obese, soft, nontender, nondistended. No hepatosplenomegaly. No bruits or masses. Good bowel sounds. Extremities: no cyanosis, clubbing, rash, edema Neuro: alert & orientedx3, cranial nerves grossly intact. moves all 4 extremities w/o difficulty. Affect pleasant   ASSESSMENT & PLAN:  1. Chronic diastolic CHF: Echo in 51/89 with EF 65-70%, RV interpreted as normal but suspect significant RV dysfunction.  -NYHA III. Volume status stable. Continue torsemide 100 mg bid and metolazone 2.5 mg three times a week.  -Continue current dose of potassium  -Continue spironolactone to 50 mg daily.  2. Atrial fibrillation: Paroxysmal.  - Regular on exam No bleeding issues.   - Continue Xarelto.  3. H/o PE: 5/20, continue Xarelto.  4. OHS/OSA: Continue CPAP at night and oxygen during the day. -Reinforced CPAP.   5. HTN: Elevated but has not had meds.  6. "Falling out" spells:  Resolved now that he is using CPAP.   Zio Patch  No  arrhythmias  7. Obesity Body mass index is 57.54 kg/m.  Discussed portion control.   Follow up in 3 months. Continue HF Paramedicine.  Kensley Valladares NP-C  03/19/2020 1:32 PM

## 2020-03-19 NOTE — Patient Instructions (Signed)
Follow up in 3 months

## 2020-03-19 NOTE — Progress Notes (Signed)
CSW met with pt during clinic visit to check in.  Pt is very tired- having trouble sleeping because of all the stuff from his moms house in his room.  Pt is interested in putting everything in storage but does not have an easy way to research storage units.  CSW provided pt with a list of local options for him to call and compare pricing.  CSW also called [facility] to check in regarding pt waitlist status as they should be sending out a letter for pt to confirm his desire to be on the waitlist- unable to reach- left VM requesting return call to clarify when we should expect the letter  Pt reports no further needs at this time  [name], [facility] Desk#: [phone] Cell#: [phone]

## 2020-03-23 ENCOUNTER — Other Ambulatory Visit (HOSPITAL_COMMUNITY): Payer: Self-pay

## 2020-03-23 NOTE — Progress Notes (Signed)
Paramedicine Encounter    Patient ID: Benjamin [name], male    DOB: 11/05/1965, 54 y.o.   MRN: [id number]   Patient Care Team: [name], NP as PCP - General (Nurse Practitioner) [name], MD as PCP - Cardiology (Cardiology) [name], LCSW as Social Worker (Licensed Clinical Social Worker)  Patient Active Problem List   Diagnosis Date Noted  . Acute on chronic diastolic heart failure ([place]) 02/24/2020  . Diabetic neuropathy ([place]) 11/20/2019  . Coagulation disorder ([place]) 11/20/2019  . Acute on chronic diastolic CHF (congestive heart failure), NYHA class 3 ([place]) 07/30/2019  . Diabetes mellitus without complication ([place]) 28/78/6767  . Hypertensive emergency   . BRBPR (bright red blood per rectum) 06/12/2019  . Hypertension   . CHF (congestive heart failure) ([place])   . Sleep apnea   . Chronic respiratory failure ([place])   . Hypokalemia 05/29/2019  . GI bleed 05/29/2019  . Anemia 05/28/2019  . Acute on chronic diastolic CHF (congestive heart failure) ([place]) 04/11/2019  . Iron deficiency anemia 03/11/2019  . OSA on CPAP 03/11/2019  . HLD (hyperlipidemia) 03/11/2019  . Elevated troponin 03/11/2019  . GERD (gastroesophageal reflux disease) 03/11/2019  . Rectal bleeding 02/20/2019  . Dyspnea 02/06/2019  . COVID-19 virus infection 02/06/2019  . Bilateral lower extremity edema   . Morbid obesity with BMI of 50.0-59.9, adult ([name])   . Chronic diastolic CHF (congestive heart failure) ([place])   . PAF (paroxysmal atrial fibrillation) ([place])   . PE (pulmonary thromboembolism) ([place]) 01/21/2019  . Hypertensive urgency 01/21/2019  . Diabetes mellitus type 2 in obese ([place]) 01/21/2019    Current Outpatient Medications:  .  amLODipine (NORVASC) 10 MG tablet, Take 1 tablet (10 mg total) by mouth daily., Disp: 90 tablet, Rfl: 1 .  atorvastatin (LIPITOR) 40 MG tablet, Take 1 tablet (40 mg total) by mouth at bedtime., Disp: 90 tablet, Rfl: 2 .  Blood Glucose Monitoring Suppl (TRUE  METRIX METER) w/Device KIT, Use as instructed. Check blood glucose level by fingerstick twice per day. (Patient taking differently: 1 each by Other route See admin instructions. Use as instructed. Check blood glucose level by fingerstick twice per day.), Disp: 1 kit, Rfl: 0 .  Blood Pressure Monitor DEVI, Please provide patient with insurance approved blood pressure device with L-XL cuff. BMI 55 (Patient taking differently: 1 each by Other route See admin instructions. Please provide patient with insurance approved blood pressure device with L-XL cuff. BMI 55), Disp: 1 each, Rfl: 0 .  carboxymethylcellulose (REFRESH PLUS) 0.5 % SOLN, Place 1 drop into both eyes daily. , Disp: , Rfl:  .  docusate sodium (COLACE) 100 MG capsule, Take 100 mg by mouth 2 (two) times daily., Disp: , Rfl:  .  doxazosin (CARDURA) 1 MG tablet, Take 1 tablet (1 mg total) by mouth daily., Disp: 30 tablet, Rfl: 11 .  ferrous sulfate 325 (65 FE) MG tablet, Take 1 tablet (325 mg total) by mouth daily. Patient will pick up scripts today. (Patient taking differently: Take 325 mg by mouth daily. ), Disp: 90 tablet, Rfl: 0 .  glucose blood (TRUE METRIX BLOOD GLUCOSE TEST) test strip, Use as instructed. Check blood glucose level by fingerstick twice per day. (Patient taking differently: 1 each by Other route See admin instructions. Use as instructed. Check blood glucose level by fingerstick twice per day.), Disp: 100 each, Rfl: 12 .  labetalol (NORMODYNE) 200 MG tablet, Take 2 tablets (400 mg total) by mouth 2 (two) times daily.,  Disp: 120 tablet, Rfl: 3 .  losartan (COZAAR) 100 MG tablet, Take 1 tablet (100 mg total) by mouth daily. (Patient taking differently: Take 100 mg by mouth at bedtime. ), Disp: 30 tablet, Rfl: 5 .  magnesium oxide (MAG-OX) 400 MG tablet, Take 2 tablets (800 mg total) by mouth every morning AND 1 tablet (400 mg total) every evening., Disp: 90 tablet, Rfl: 6 .  metFORMIN (GLUCOPHAGE) 500 MG tablet, Take 1 tablet (500  mg total) by mouth 2 (two) times daily with a meal. Patient will pick up scripts today., Disp: 180 tablet, Rfl: 1 .  metolazone (ZAROXOLYN) 5 MG tablet, TAKE 1 TABLET EVERY MONDAY, WEDNESDAY, AND FRIDAY. (Patient taking differently: Take 5 mg by mouth See admin instructions. Take 1 tablet every Monday, Wednesday, and Friday.), Disp: 15 tablet, Rfl: 3 .  Misc. Devices MISC, Requires O2 @ 3L/min continuously via nasal canula and home fill system,. (Patient taking differently: 1 each by Other route See admin instructions. Requires O2 @ 3L/min continuously via nasal canula and home fill system,.), Disp: 1 each, Rfl: 0 .  Misc. Devices MISC, Please provide Mr. Abdallah with insurance approved portable O2 concentrator ICD 10 J96.11 [phone] (Patient taking differently: 1 each by Other route See admin instructions. Please provide Mr. Gabay with insurance approved portable O2 concentrator ICD 10 J96.11 Z99.81), Disp: 1 each, Rfl: 0 .  potassium chloride SA (KLOR-CON) 10 MEQ tablet, Take 60 meq (6 tabs) in the morning, 60 meq (6 tabs) in the afternoon and 40 meq (4 tabs) in the evening.  Take an extra 4 tabs every dose of metolazone (Patient taking differently: Take 40-60 mEq by mouth See admin instructions. Take 60 meq (6 tabs) in the morning, 60 meq (6 tabs) in the afternoon and 40 meq (4 tabs) in the evening.  Take an extra 4 tabs every dose of metolazone), Disp: 540 tablet, Rfl: 3 .  PROVENTIL HFA 108 (90 Base) MCG/ACT inhaler, INHALE 2 PUFFS BY MOUTH EVERY 6 (SIX) HOURS AS NEEDED FOR WHEEZING OR SHORTNESS OF BREATH., Disp: 6.7 g, Rfl: 2 .  rivaroxaban (XARELTO) 20 MG TABS tablet, Take 1 tablet (20 mg total) by mouth daily with supper., Disp: 60 tablet, Rfl: 6 .  spironolactone (ALDACTONE) 50 MG tablet, TAKE 1 TABLET (50 MG TOTAL) BY MOUTH DAILY., Disp: 30 tablet, Rfl: 3 .  torsemide (DEMADEX) 100 MG tablet, Take 1 tablet (100 mg total) by mouth 2 (two) times daily., Disp: 180 tablet, Rfl: 3 .  TRUEplus Lancets  28G MISC, Use as instructed. Check blood glucose level by fingerstick twice per day. (Patient taking differently: 1 each by Other route See admin instructions. Use as instructed. Check blood glucose level by fingerstick twice per day.), Disp: 200 each, Rfl: 3 .  omeprazole (PRILOSEC) 20 MG capsule, Take 1 capsule (20 mg total) by mouth 2 (two) times daily before a meal. Take first capsule 30 min prior to eating or taking other medications., Disp: 60 capsule, Rfl: 2 No Known Allergies    Social History   Socioeconomic History  . Marital status: Single    Spouse name: Not on file  . Number of children: Not on file  . Years of education: Not on file  . Highest education level: Not on file  Occupational History  . Not on file  Tobacco Use  . Smoking status: Former Research scientist (life sciences)  . Smokeless tobacco: Never Used  Vaping Use  . Vaping Use: Never used  Substance and Sexual Activity  .  Alcohol use: Not Currently  . Drug use: Not Currently  . Sexual activity: Not Currently  Other Topics Concern  . Not on file  Social History Narrative  . Not on file   Social Determinants of Health   Financial Resource Strain: Medium Risk  . Difficulty of Paying Living Expenses: Somewhat hard  Food Insecurity: Food Insecurity Present  . Worried About Charity fundraiser in the Last Year: Sometimes true  . Ran Out of Food in the Last Year: Sometimes true  Transportation Needs: No Transportation Needs  . Lack of Transportation (Medical): No  . Lack of Transportation (Non-Medical): No  Physical Activity:   . Days of Exercise per Week:   . Minutes of Exercise per Session:   Stress:   . Feeling of Stress :   Social Connections:   . Frequency of Communication with Friends and Family:   . Frequency of Social Gatherings with Friends and Family:   . Attends Religious Services:   . Active Member of Clubs or Organizations:   . Attends Archivist Meetings:   Marland Kitchen Marital Status:   Intimate Partner  Violence:   . Fear of Current or Ex-Partner:   . Emotionally Abused:   Marland Kitchen Physically Abused:   . Sexually Abused:     Physical Exam      Future Appointments  Date Time Provider [place]  04/17/2020 11:10 AM [name], NP [facility] None    BP (!) 180/108   Pulse 74   Temp (!) 97.4 F (36.3 C)   Resp 18   Wt (!) 396 lb (179.6 kg)   SpO2 98%   BMI 56.82 kg/m   Weight yesterday-396 Last visit weight-396  Pt reports he is doing well. However he has not been able to use his CPAP in over 2 wks-since his moms death. He stayed at her place for at least a week to clean out her apt and now he has so many items of hers in his small room. He has his bed piled up with things, I asked him to just move things around for bedtime so he can lay as comfortable in bed as he can to get to his CPAP. His b/p is elevated. He did miss his Wednesday dose of his meds-he said he slept all day and he needed rest after the stress of his loss. That Wednesday dose has his metolazone in it. He denies sob, no c/p. Slight edema in lower legs, not bad though.  Will see him next week.  He wants to get back in the gym next week.   [name], [facility] Paramedic  03/24/20

## 2020-03-25 ENCOUNTER — Telehealth (HOSPITAL_COMMUNITY): Payer: Self-pay | Admitting: *Deleted

## 2020-03-25 ENCOUNTER — Telehealth (HOSPITAL_COMMUNITY): Payer: Self-pay | Admitting: Licensed Clinical Social Worker

## 2020-03-25 NOTE — Telephone Encounter (Signed)
Community paramedic requesting referral to [employer] to assist with pt getting food.  CSW typed referral and provided to [employer] who will provide to pt.  CSW will continue to follow and assist as needed  [name], LCSW Clinical Social Worker [facility] Failure Clinic Desk#: [phone] Cell#: [phone]

## 2020-03-25 NOTE — Telephone Encounter (Signed)
Called to check in with patient's status of gym membership and to share condolences for the passing of his mother. He stated he was really excited about getting back to the [facility] and that he plans to go next week with payment card ready for joining. Will follow patient as needed.     Lesia Hausen, MS, ACSM, [facility] Clinical Exercise Physiologist/ Health and Wellness Coach

## 2020-03-30 ENCOUNTER — Other Ambulatory Visit (HOSPITAL_COMMUNITY): Payer: Self-pay

## 2020-03-30 ENCOUNTER — Telehealth (HOSPITAL_COMMUNITY): Payer: Self-pay | Admitting: Licensed Clinical Social Worker

## 2020-03-30 NOTE — Telephone Encounter (Signed)
CSW informed by [employer] paramedic that pt is stressed after losing his wallet and continuing to worry about not having his full rent this month after paying for part of his moms funeral expenses.  CSW called pt to discuss.  He reports that he has canceled all his cards to prevent money being taken but needs help getting all the cards again.  Has already reordered his food stamp card when he called to cancel it- should be out in 7-10 days.  Also reports he is going over to the bank tomorrow to get a new debit card.  Pt needing help getting new license- CSW able to order for pt online.  CSW assisted pt in calling [employer] member services to order him a new medicaid card as well- should arrive in 7 calender days per representative.  CSW will continue to follow and assist as needed  [name], LCSW Clinical Social Worker [facility] Desk#: [phone] Cell#: [phone]

## 2020-03-30 NOTE — Progress Notes (Signed)
Paramedicine Encounter    Patient ID: Benjamin [name], male    DOB: 11/05/1965, 54 y.o.   MRN: [id number]   Patient Care Team: Benjamin Pounds, NP as PCP - General (Nurse Practitioner) Benjamin Records, MD as PCP - Cardiology (Cardiology) Benjamin Ny, LCSW as Social Worker (Licensed Clinical Social Worker)  Patient Active Problem List   Diagnosis Date Noted  . Acute on chronic diastolic heart failure ([place]) 02/24/2020  . Diabetic neuropathy ([place]) 11/20/2019  . Coagulation disorder ([place]) 11/20/2019  . Acute on chronic diastolic CHF (congestive heart failure), NYHA class 3 ([place]) 07/30/2019  . Diabetes mellitus without complication ([place]) 28/78/6767  . Hypertensive emergency   . BRBPR (bright red blood per rectum) 06/12/2019  . Hypertension   . CHF (congestive heart failure) ([place])   . Sleep apnea   . Chronic respiratory failure ([place])   . Hypokalemia 05/29/2019  . GI bleed 05/29/2019  . Anemia 05/28/2019  . Acute on chronic diastolic CHF (congestive heart failure) (Benjamin [name]) 04/11/2019  . Iron deficiency anemia 03/11/2019  . OSA on CPAP 03/11/2019  . HLD (hyperlipidemia) 03/11/2019  . Elevated troponin 03/11/2019  . GERD (gastroesophageal reflux disease) 03/11/2019  . Rectal bleeding 02/20/2019  . Dyspnea 02/06/2019  . COVID-19 virus infection 02/06/2019  . Bilateral lower extremity edema   . Morbid obesity with BMI of 50.0-59.9, adult (Benjamin [name])   . Chronic diastolic CHF (congestive heart failure) (Benjamin [name])   . PAF (paroxysmal atrial fibrillation) ([place])   . PE (pulmonary thromboembolism) ([place]) 01/21/2019  . Hypertensive urgency 01/21/2019  . Diabetes mellitus type 2 in obese (Benjamin [name]) 01/21/2019    Current Outpatient Medications:  .  amLODipine (NORVASC) 10 MG tablet, Take 1 tablet (10 mg total) by mouth daily., Disp: 90 tablet, Rfl: 1 .  atorvastatin (LIPITOR) 40 MG tablet, Take 1 tablet (40 mg total) by mouth at bedtime., Disp: 90 tablet, Rfl: 2 .  Blood Glucose Monitoring Suppl (TRUE  METRIX METER) w/Device KIT, Use as instructed. Check blood glucose level by fingerstick twice per day. (Patient taking differently: 1 each by Other route See admin instructions. Use as instructed. Check blood glucose level by fingerstick twice per day.), Disp: 1 kit, Rfl: 0 .  Blood Pressure Monitor DEVI, Please provide patient with insurance approved blood pressure device with L-XL cuff. BMI 55 (Patient taking differently: 1 each by Other route See admin instructions. Please provide patient with insurance approved blood pressure device with L-XL cuff. BMI 55), Disp: 1 each, Rfl: 0 .  carboxymethylcellulose (REFRESH PLUS) 0.5 % SOLN, Place 1 drop into both eyes daily. , Disp: , Rfl:  .  docusate sodium (COLACE) 100 MG capsule, Take 100 mg by mouth 2 (two) times daily., Disp: , Rfl:  .  doxazosin (CARDURA) 1 MG tablet, Take 1 tablet (1 mg total) by mouth daily., Disp: 30 tablet, Rfl: 11 .  ferrous sulfate 325 (65 FE) MG tablet, Take 1 tablet (325 mg total) by mouth daily. Patient will pick up scripts today. (Patient taking differently: Take 325 mg by mouth daily. ), Disp: 90 tablet, Rfl: 0 .  glucose blood (TRUE METRIX BLOOD GLUCOSE TEST) test strip, Use as instructed. Check blood glucose level by fingerstick twice per day. (Patient taking differently: 1 each by Other route See admin instructions. Use as instructed. Check blood glucose level by fingerstick twice per day.), Disp: 100 each, Rfl: 12 .  labetalol (NORMODYNE) 200 MG tablet, Take 2 tablets (400 mg total) by mouth 2 (two) times daily.,  Disp: 120 tablet, Rfl: 3 .  losartan (COZAAR) 100 MG tablet, Take 1 tablet (100 mg total) by mouth daily. (Patient taking differently: Take 100 mg by mouth at bedtime. ), Disp: 30 tablet, Rfl: 5 .  magnesium oxide (MAG-OX) 400 MG tablet, Take 2 tablets (800 mg total) by mouth every morning AND 1 tablet (400 mg total) every evening., Disp: 90 tablet, Rfl: 6 .  metFORMIN (GLUCOPHAGE) 500 MG tablet, Take 1 tablet (500  mg total) by mouth 2 (two) times daily with a meal. Patient will pick up scripts today., Disp: 180 tablet, Rfl: 1 .  metolazone (ZAROXOLYN) 5 MG tablet, TAKE 1 TABLET EVERY MONDAY, WEDNESDAY, AND FRIDAY. (Patient taking differently: Take 5 mg by mouth See admin instructions. Take 1 tablet every Monday, Wednesday, and Friday.), Disp: 15 tablet, Rfl: 3 .  Misc. Devices MISC, Requires O2 @ 3L/min continuously via nasal canula and home fill system,. (Patient taking differently: 1 each by Other route See admin instructions. Requires O2 @ 3L/min continuously via nasal canula and home fill system,.), Disp: 1 each, Rfl: 0 .  Misc. Devices MISC, Please provide Benjamin [name] with insurance approved portable O2 concentrator ICD 10 J96.11 [phone] (Patient taking differently: 1 each by Other route See admin instructions. Please provide Benjamin [name] with insurance approved portable O2 concentrator ICD 10 J96.11 Z99.81), Disp: 1 each, Rfl: 0 .  potassium chloride SA (KLOR-CON) 10 MEQ tablet, Take 60 meq (6 tabs) in the morning, 60 meq (6 tabs) in the afternoon and 40 meq (4 tabs) in the evening.  Take an extra 4 tabs every dose of metolazone (Patient taking differently: Take 40-60 mEq by mouth See admin instructions. Take 60 meq (6 tabs) in the morning, 60 meq (6 tabs) in the afternoon and 40 meq (4 tabs) in the evening.  Take an extra 4 tabs every dose of metolazone), Disp: 540 tablet, Rfl: 3 .  PROVENTIL HFA 108 (90 Base) MCG/ACT inhaler, INHALE 2 PUFFS BY MOUTH EVERY 6 (SIX) HOURS AS NEEDED FOR WHEEZING OR SHORTNESS OF BREATH., Disp: 6.7 g, Rfl: 2 .  rivaroxaban (XARELTO) 20 MG TABS tablet, Take 1 tablet (20 mg total) by mouth daily with supper., Disp: 60 tablet, Rfl: 6 .  spironolactone (ALDACTONE) 50 MG tablet, TAKE 1 TABLET (50 MG TOTAL) BY MOUTH DAILY., Disp: 30 tablet, Rfl: 3 .  torsemide (DEMADEX) 100 MG tablet, Take 1 tablet (100 mg total) by mouth 2 (two) times daily., Disp: 180 tablet, Rfl: 3 .  TRUEplus Lancets  28G MISC, Use as instructed. Check blood glucose level by fingerstick twice per day. (Patient taking differently: 1 each by Other route See admin instructions. Use as instructed. Check blood glucose level by fingerstick twice per day.), Disp: 200 each, Rfl: 3 .  omeprazole (PRILOSEC) 20 MG capsule, Take 1 capsule (20 mg total) by mouth 2 (two) times daily before a meal. Take first capsule 30 min prior to eating or taking other medications., Disp: 60 capsule, Rfl: 2 No Known Allergies    Social History   Socioeconomic History  . Marital status: Single    Spouse name: Not on file  . Number of children: Not on file  . Years of education: Not on file  . Highest education level: Not on file  Occupational History  . Not on file  Tobacco Use  . Smoking status: Former Research scientist (life sciences)  . Smokeless tobacco: Never Used  Vaping Use  . Vaping Use: Never used  Substance and Sexual Activity  .  Alcohol use: Not Currently  . Drug use: Not Currently  . Sexual activity: Not Currently  Other Topics Concern  . Not on file  Social History Narrative  . Not on file   Social Determinants of Health   Financial Resource Strain: Medium Risk  . Difficulty of Paying Living Expenses: Somewhat hard  Food Insecurity: Food Insecurity Present  . Worried About Charity fundraiser in the Last Year: Sometimes true  . Ran Out of Food in the Last Year: Sometimes true  Transportation Needs: No Transportation Needs  . Lack of Transportation (Medical): No  . Lack of Transportation (Non-Medical): No  Physical Activity:   . Days of Exercise per Week:   . Minutes of Exercise per Session:   Stress:   . Feeling of Stress :   Social Connections:   . Frequency of Communication with Friends and Family:   . Frequency of Social Gatherings with Friends and Family:   . Attends Religious Services:   . Active Member of Clubs or Organizations:   . Attends Archivist Meetings:   Marland Kitchen Marital Status:   Intimate Partner  Violence:   . Fear of Current or Ex-Partner:   . Emotionally Abused:   Marland Kitchen Physically Abused:   . Sexually Abused:     Physical Exam      Future Appointments  Date Time Provider [place]  04/17/2020 11:10 AM Benjamin Pounds, NP [facility] None    BP (!) 172/0   Pulse 84   Temp (!) 97.3 F (36.3 C)   Resp 20   Wt (!) 392 lb (177.8 kg)   SpO2 96%   BMI 56.25 kg/m   Weight yesterday-392 Last visit weight-396  Pt reports he has had some bad luck this month--he has lost his wallet a few days ago and had all his info and stuff in it. He will contact jenna for more help. He cut off his food stamp card and a replacement will come. He states he is in a hole and needs some money but cant really do anything physically for work to help himself get ahead.  I encouraged him to hang in there, he is not in trouble of becoming evicted and jenna will contact him once she gets in the office for more advice.   -needs colace, doxazosin, losartan, metformin, torsemide metformin is in thru Tuesday. --those were called in. I will p/u and bring out tomor.  He reports he needs his muscle relaxer b/c his stomach is cramping b/c of COVID he has had lasting cramping from that.   B/p elevated-he missed past 2 days of meds. He is using his CPAP more.   [name], [facility] Paramedic  03/30/20

## 2020-04-01 ENCOUNTER — Other Ambulatory Visit (HOSPITAL_COMMUNITY): Payer: Self-pay

## 2020-04-01 DIAGNOSIS — J9611 Chronic respiratory failure with hypoxia: Secondary | ICD-10-CM | POA: Diagnosis not present

## 2020-04-01 DIAGNOSIS — I5032 Chronic diastolic (congestive) heart failure: Secondary | ICD-10-CM | POA: Diagnosis not present

## 2020-04-06 ENCOUNTER — Telehealth (HOSPITAL_COMMUNITY): Payer: Self-pay | Admitting: *Deleted

## 2020-04-06 ENCOUNTER — Other Ambulatory Visit (HOSPITAL_COMMUNITY): Payer: Self-pay

## 2020-04-06 NOTE — Telephone Encounter (Signed)
Katie with paramedicine called to report pts bp was elevated. Systolic bp 190. Pt missed his evening medication yesterday but took his morning medication. Pt is asymptomatic. Pt stated he's compliant with cpap use (compliance has been an issue in the past).  Pts diet does consist of high sodium food. Per [employer] "pt needs to be more compliant with cpap use and medications. Have pt recheck bp later this week if bp still high will adjust medications." Katie aware and agreeable with plan.

## 2020-04-06 NOTE — Progress Notes (Signed)
Paramedicine Encounter    Patient ID: Benjamin [name], male    DOB: 11/05/1965, 54 y.o.   MRN: [id number]   Patient Care Team: [name], NP as PCP - General (Nurse Practitioner) [name], MD as PCP - Cardiology (Cardiology) [name], LCSW as Social Worker (Licensed Clinical Social Worker)  Patient Active Problem List   Diagnosis Date Noted  . Acute on chronic diastolic heart failure ([place]) 02/24/2020  . Diabetic neuropathy ([place]) 11/20/2019  . Coagulation disorder ([place]) 11/20/2019  . Acute on chronic diastolic CHF (congestive heart failure), NYHA class 3 ([place]) 07/30/2019  . Diabetes mellitus without complication ([place]) 28/78/6767  . Hypertensive emergency   . BRBPR (bright red blood per rectum) 06/12/2019  . Hypertension   . CHF (congestive heart failure) ([place])   . Sleep apnea   . Chronic respiratory failure ([place])   . Hypokalemia 05/29/2019  . GI bleed 05/29/2019  . Anemia 05/28/2019  . Acute on chronic diastolic CHF (congestive heart failure) ([place]) 04/11/2019  . Iron deficiency anemia 03/11/2019  . OSA on CPAP 03/11/2019  . HLD (hyperlipidemia) 03/11/2019  . Elevated troponin 03/11/2019  . GERD (gastroesophageal reflux disease) 03/11/2019  . Rectal bleeding 02/20/2019  . Dyspnea 02/06/2019  . COVID-19 virus infection 02/06/2019  . Bilateral lower extremity edema   . Morbid obesity with BMI of 50.0-59.9, adult ([name])   . Chronic diastolic CHF (congestive heart failure) ([place])   . PAF (paroxysmal atrial fibrillation) ([place])   . PE (pulmonary thromboembolism) ([place]) 01/21/2019  . Hypertensive urgency 01/21/2019  . Diabetes mellitus type 2 in obese ([place]) 01/21/2019    Current Outpatient Medications:  .  amLODipine (NORVASC) 10 MG tablet, Take 1 tablet (10 mg total) by mouth daily., Disp: 90 tablet, Rfl: 1 .  atorvastatin (LIPITOR) 40 MG tablet, Take 1 tablet (40 mg total) by mouth at bedtime., Disp: 90 tablet, Rfl: 2 .  Blood Glucose Monitoring Suppl (TRUE  METRIX METER) w/Device KIT, Use as instructed. Check blood glucose level by fingerstick twice per day. (Patient taking differently: 1 each by Other route See admin instructions. Use as instructed. Check blood glucose level by fingerstick twice per day.), Disp: 1 kit, Rfl: 0 .  Blood Pressure Monitor DEVI, Please provide patient with insurance approved blood pressure device with L-XL cuff. BMI 55 (Patient taking differently: 1 each by Other route See admin instructions. Please provide patient with insurance approved blood pressure device with L-XL cuff. BMI 55), Disp: 1 each, Rfl: 0 .  carboxymethylcellulose (REFRESH PLUS) 0.5 % SOLN, Place 1 drop into both eyes daily. , Disp: , Rfl:  .  docusate sodium (COLACE) 100 MG capsule, Take 100 mg by mouth 2 (two) times daily., Disp: , Rfl:  .  doxazosin (CARDURA) 1 MG tablet, Take 1 tablet (1 mg total) by mouth daily., Disp: 30 tablet, Rfl: 11 .  ferrous sulfate 325 (65 FE) MG tablet, Take 1 tablet (325 mg total) by mouth daily. Patient will pick up scripts today. (Patient taking differently: Take 325 mg by mouth daily. ), Disp: 90 tablet, Rfl: 0 .  glucose blood (TRUE METRIX BLOOD GLUCOSE TEST) test strip, Use as instructed. Check blood glucose level by fingerstick twice per day. (Patient taking differently: 1 each by Other route See admin instructions. Use as instructed. Check blood glucose level by fingerstick twice per day.), Disp: 100 each, Rfl: 12 .  labetalol (NORMODYNE) 200 MG tablet, Take 2 tablets (400 mg total) by mouth 2 (two) times daily.,  Disp: 120 tablet, Rfl: 3 .  losartan (COZAAR) 100 MG tablet, Take 1 tablet (100 mg total) by mouth daily. (Patient taking differently: Take 100 mg by mouth at bedtime. ), Disp: 30 tablet, Rfl: 5 .  magnesium oxide (MAG-OX) 400 MG tablet, Take 2 tablets (800 mg total) by mouth every morning AND 1 tablet (400 mg total) every evening., Disp: 90 tablet, Rfl: 6 .  metFORMIN (GLUCOPHAGE) 500 MG tablet, Take 1 tablet (500  mg total) by mouth 2 (two) times daily with a meal. Patient will pick up scripts today., Disp: 180 tablet, Rfl: 1 .  metolazone (ZAROXOLYN) 5 MG tablet, TAKE 1 TABLET EVERY MONDAY, WEDNESDAY, AND FRIDAY. (Patient taking differently: Take 5 mg by mouth See admin instructions. Take 1 tablet every Monday, Wednesday, and Friday.), Disp: 15 tablet, Rfl: 3 .  Misc. Devices MISC, Requires O2 @ 3L/min continuously via nasal canula and home fill system,. (Patient taking differently: 1 each by Other route See admin instructions. Requires O2 @ 3L/min continuously via nasal canula and home fill system,.), Disp: 1 each, Rfl: 0 .  Misc. Devices MISC, Please provide Mr. Calica with insurance approved portable O2 concentrator ICD 10 J96.11 [phone] (Patient taking differently: 1 each by Other route See admin instructions. Please provide Mr. Camey with insurance approved portable O2 concentrator ICD 10 J96.11 Z99.81), Disp: 1 each, Rfl: 0 .  potassium chloride SA (KLOR-CON) 10 MEQ tablet, Take 60 meq (6 tabs) in the morning, 60 meq (6 tabs) in the afternoon and 40 meq (4 tabs) in the evening.  Take an extra 4 tabs every dose of metolazone (Patient taking differently: Take 40-60 mEq by mouth See admin instructions. Take 60 meq (6 tabs) in the morning, 60 meq (6 tabs) in the afternoon and 40 meq (4 tabs) in the evening.  Take an extra 4 tabs every dose of metolazone), Disp: 540 tablet, Rfl: 3 .  PROVENTIL HFA 108 (90 Base) MCG/ACT inhaler, INHALE 2 PUFFS BY MOUTH EVERY 6 (SIX) HOURS AS NEEDED FOR WHEEZING OR SHORTNESS OF BREATH., Disp: 6.7 g, Rfl: 2 .  rivaroxaban (XARELTO) 20 MG TABS tablet, Take 1 tablet (20 mg total) by mouth daily with supper., Disp: 60 tablet, Rfl: 6 .  spironolactone (ALDACTONE) 50 MG tablet, TAKE 1 TABLET (50 MG TOTAL) BY MOUTH DAILY., Disp: 30 tablet, Rfl: 3 .  torsemide (DEMADEX) 100 MG tablet, Take 1 tablet (100 mg total) by mouth 2 (two) times daily., Disp: 180 tablet, Rfl: 3 .  TRUEplus Lancets  28G MISC, Use as instructed. Check blood glucose level by fingerstick twice per day. (Patient taking differently: 1 each by Other route See admin instructions. Use as instructed. Check blood glucose level by fingerstick twice per day.), Disp: 200 each, Rfl: 3 .  omeprazole (PRILOSEC) 20 MG capsule, Take 1 capsule (20 mg total) by mouth 2 (two) times daily before a meal. Take first capsule 30 min prior to eating or taking other medications., Disp: 60 capsule, Rfl: 2 No Known Allergies    Social History   Socioeconomic History  . Marital status: Single    Spouse name: Not on file  . Number of children: Not on file  . Years of education: Not on file  . Highest education level: Not on file  Occupational History  . Not on file  Tobacco Use  . Smoking status: Former Research scientist (life sciences)  . Smokeless tobacco: Never Used  Vaping Use  . Vaping Use: Never used  Substance and Sexual Activity  .  Alcohol use: Not Currently  . Drug use: Not Currently  . Sexual activity: Not Currently  Other Topics Concern  . Not on file  Social History Narrative  . Not on file   Social Determinants of Health   Financial Resource Strain: Medium Risk  . Difficulty of Paying Living Expenses: Somewhat hard  Food Insecurity: Food Insecurity Present  . Worried About Charity fundraiser in the Last Year: Sometimes true  . Ran Out of Food in the Last Year: Sometimes true  Transportation Needs: No Transportation Needs  . Lack of Transportation (Medical): No  . Lack of Transportation (Non-Medical): No  Physical Activity:   . Days of Exercise per Week:   . Minutes of Exercise per Session:   Stress:   . Feeling of Stress :   Social Connections:   . Frequency of Communication with Friends and Family:   . Frequency of Social Gatherings with Friends and Family:   . Attends Religious Services:   . Active Member of Clubs or Organizations:   . Attends Archivist Meetings:   Marland Kitchen Marital Status:   Intimate Partner  Violence:   . Fear of Current or Ex-Partner:   . Emotionally Abused:   Marland Kitchen Physically Abused:   . Sexually Abused:     Physical Exam      Future Appointments  Date Time Provider [place]  04/17/2020 11:10 AM [name], NP [facility] None  07/02/2020  1:30 PM [facility] PA/NP [facility] None    BP (!) 190/0   Pulse 78   Temp 98.8 F (37.1 C)   Resp 20   Wt (!) 396 lb (179.6 kg)   SpO2 95%   BMI 56.82 kg/m   Weight yesterday-?? Last visit weight-392  Pt reports he is doing ok. He is tired, he is falling asleep during my visit this morning. Pt did miss last nights dose of his meds, no other missed doses this week. He reports using hs CPAP machine, but he is falling asleep now. He reports the rainy weather and the fan on him is making him sleepy. He is stressed about his money situation. Likely eating high sodium foods.   --need to ask jenna about his future appointments to see if she is aware of anything else for him if she helped sch it--he has text from a dr Quincy Simmonds  Office but he isnt sure where to go.  I looked up dr Quincy Simmonds and it showed several doctors and they are in different fields of work so not sure.  His b/p very elevated today. Called clinic to let them aware. No changes right now since he missed his meds last night. Will ask him to check his b/p this week to see how he is doing after taking meds more consistent.  meds verified and pill box refilled.  Needs these refilled-- -amlodipine, atorvastatin, labetalol, magnesium, metolazone, xarelto, [place], [place] Paramedic  04/06/20

## 2020-04-06 NOTE — Progress Notes (Signed)
P/u pt meds from pharmacy and took them to him and placed in pill box where needed.   Kerry Hough, EMT-Paramedic  04/06/20

## 2020-04-08 ENCOUNTER — Other Ambulatory Visit: Payer: Self-pay | Admitting: Nurse Practitioner

## 2020-04-08 ENCOUNTER — Telehealth (HOSPITAL_COMMUNITY): Payer: Self-pay | Admitting: Licensed Clinical Social Worker

## 2020-04-08 ENCOUNTER — Telehealth: Payer: Self-pay

## 2020-04-08 DIAGNOSIS — E119 Type 2 diabetes mellitus without complications: Secondary | ICD-10-CM | POA: Diagnosis not present

## 2020-04-08 DIAGNOSIS — H524 Presbyopia: Secondary | ICD-10-CM | POA: Diagnosis not present

## 2020-04-08 DIAGNOSIS — I10 Essential (primary) hypertension: Secondary | ICD-10-CM

## 2020-04-08 DIAGNOSIS — H35033 Hypertensive retinopathy, bilateral: Secondary | ICD-10-CM | POA: Diagnosis not present

## 2020-04-08 DIAGNOSIS — H5213 Myopia, bilateral: Secondary | ICD-10-CM | POA: Diagnosis not present

## 2020-04-08 DIAGNOSIS — R1084 Generalized abdominal pain: Secondary | ICD-10-CM

## 2020-04-08 DIAGNOSIS — H52223 Regular astigmatism, bilateral: Secondary | ICD-10-CM | POA: Diagnosis not present

## 2020-04-08 DIAGNOSIS — H2513 Age-related nuclear cataract, bilateral: Secondary | ICD-10-CM | POA: Diagnosis not present

## 2020-04-08 LAB — HM DIABETES EYE EXAM

## 2020-04-08 NOTE — Telephone Encounter (Signed)
Pt called CSW and informed he doesn't have transport to eye doctor appt today- was too late to scheduled medicaid transport.  CSW able to schedule a cab to take him to appt  [name], LCSW Clinical Social Worker [facility] Desk#: [phone] Cell#: [phone]

## 2020-04-08 NOTE — Telephone Encounter (Signed)
Message received from [facility]  The patient is requesting a refill of his muscle relaxant because since COVID he has residual muscle spasms in his sides.  Please advise

## 2020-04-09 ENCOUNTER — Other Ambulatory Visit: Payer: Self-pay | Admitting: Nurse Practitioner

## 2020-04-09 MED ORDER — METHOCARBAMOL 500 MG PO TABS: 500.0000 mg | ORAL_TABLET | Freq: Three times a day (TID) | ORAL | 1 refills | Status: DC | PRN

## 2020-04-09 NOTE — Telephone Encounter (Signed)
SENT. Needs to take as little as possible as he is already on heart medication that can slow the heart rate down. Muscle relaxants can have the same effect.

## 2020-04-10 DIAGNOSIS — J9611 Chronic respiratory failure with hypoxia: Secondary | ICD-10-CM | POA: Diagnosis not present

## 2020-04-10 DIAGNOSIS — I5032 Chronic diastolic (congestive) heart failure: Secondary | ICD-10-CM | POA: Diagnosis not present

## 2020-04-10 NOTE — Telephone Encounter (Signed)
Attempt to reach patient to inform on PCP advising on medication. Unable reach to both cell and home phone.

## 2020-04-13 ENCOUNTER — Telehealth (HOSPITAL_COMMUNITY): Payer: Self-pay | Admitting: Licensed Clinical Social Worker

## 2020-04-13 ENCOUNTER — Other Ambulatory Visit (HOSPITAL_COMMUNITY): Payer: Self-pay

## 2020-04-13 NOTE — Progress Notes (Signed)
Medications picked up for patient at [facility].   Spoke to Turner at 1100 this morning when he confirmed visit for 345-400 this afternoon.   Attempted visit for [place] today with multiple knocks at the door and no answer with multiple calls attempted with no answer. I will attempt tomorrow.    ACTION: Next visit planned for tomorrow Next call planned for tomorrow

## 2020-04-13 NOTE — Telephone Encounter (Signed)
CSW spoke with pt to remind about [facility] tomorrow.  Pt confirms he is planning on coming and would need transportation to make it to appt.  CSW will call pt a cab tomorrow to make it to support group.  [name], LCSW Clinical Social Worker [facility] Desk#: [phone] Cell#: [phone]

## 2020-04-14 ENCOUNTER — Other Ambulatory Visit (HOSPITAL_COMMUNITY): Payer: Self-pay

## 2020-04-14 NOTE — Progress Notes (Signed)
Paramedicine Encounter    Patient ID: Benjamin [name], male    DOB: [birth year], 54 y.o.   MRN: [id number]   Patient Care Team: [name], NP as PCP - General (Nurse Practitioner) [name], MD as PCP - Cardiology (Cardiology) [name], LCSW as Social Worker (Licensed Clinical Social Worker)  Patient Active Problem List   Diagnosis Date Noted   Acute on chronic diastolic heart failure ([place]) 02/24/2020   Diabetic neuropathy ([place]) 11/20/2019   Coagulation disorder ([place]) 11/20/2019   Acute on chronic diastolic CHF (congestive heart failure), NYHA class 3 ([place]) 07/30/2019   Diabetes mellitus without complication ([place]) 02/40/9735   Hypertensive emergency    BRBPR (bright red blood per rectum) 06/12/2019   Hypertension    CHF (congestive heart failure) ([place])    Sleep apnea    Chronic respiratory failure ([place])    Hypokalemia 05/29/2019   GI bleed 05/29/2019   Anemia 05/28/2019   Acute on chronic diastolic CHF (congestive heart failure) ([place]) 04/11/2019   Iron deficiency anemia 03/11/2019   OSA on CPAP 03/11/2019   HLD (hyperlipidemia) 03/11/2019   Elevated troponin 03/11/2019   GERD (gastroesophageal reflux disease) 03/11/2019   Rectal bleeding 02/20/2019   Dyspnea 02/06/2019   COVID-19 virus infection 02/06/2019   Bilateral lower extremity edema    Morbid obesity with BMI of 50.0-59.9, adult (HCC)    Chronic diastolic CHF (congestive heart failure) (HCC)    PAF (paroxysmal atrial fibrillation) ([place])    PE (pulmonary thromboembolism) ([place]) 01/21/2019   Hypertensive urgency 01/21/2019   Diabetes mellitus type 2 in obese ([place]) 01/21/2019    Current Outpatient Medications:    amLODipine (NORVASC) 10 MG tablet, TAKE ONE TABLET BY MOUTH ONCE DAILY, Disp: 30 tablet, Rfl: 2   atorvastatin (LIPITOR) 40 MG tablet, Take 1 tablet (40 mg total) by mouth at bedtime., Disp: 90 tablet, Rfl: 2   Blood Glucose Monitoring Suppl (TRUE METRIX  METER) w/Device KIT, Use as instructed. Check blood glucose level by fingerstick twice per day. (Patient taking differently: 1 each by Other route See admin instructions. Use as instructed. Check blood glucose level by fingerstick twice per day.), Disp: 1 kit, Rfl: 0   Blood Pressure Monitor DEVI, Please provide patient with insurance approved blood pressure device with L-XL cuff. BMI 55 (Patient taking differently: 1 each by Other route See admin instructions. Please provide patient with insurance approved blood pressure device with L-XL cuff. BMI 55), Disp: 1 each, Rfl: 0   carboxymethylcellulose (REFRESH PLUS) 0.5 % SOLN, Place 1 drop into both eyes daily. , Disp: , Rfl:    docusate sodium (COLACE) 100 MG capsule, Take 100 mg by mouth 2 (two) times daily., Disp: , Rfl:    doxazosin (CARDURA) 1 MG tablet, Take 1 tablet (1 mg total) by mouth daily., Disp: 30 tablet, Rfl: 11   ferrous sulfate 325 (65 FE) MG tablet, Take 1 tablet (325 mg total) by mouth daily. Patient will pick up scripts today. (Patient taking differently: Take 325 mg by mouth daily. ), Disp: 90 tablet, Rfl: 0   glucose blood (TRUE METRIX BLOOD GLUCOSE TEST) test strip, Use as instructed. Check blood glucose level by fingerstick twice per day. (Patient taking differently: 1 each by Other route See admin instructions. Use as instructed. Check blood glucose level by fingerstick twice per day.), Disp: 100 each, Rfl: 12   labetalol (NORMODYNE) 200 MG tablet, Take 2 tablets (400 mg total) by mouth 2 (two) times daily., Disp: 120  tablet, Rfl: 3   losartan (COZAAR) 100 MG tablet, Take 1 tablet (100 mg total) by mouth daily. (Patient taking differently: Take 100 mg by mouth at bedtime. ), Disp: 30 tablet, Rfl: 5   magnesium oxide (MAG-OX) 400 MG tablet, Take 2 tablets (800 mg total) by mouth every morning AND 1 tablet (400 mg total) every evening., Disp: 90 tablet, Rfl: 6   metFORMIN (GLUCOPHAGE) 500 MG tablet, Take 1 tablet (500 mg  total) by mouth 2 (two) times daily with a meal. Patient will pick up scripts today., Disp: 180 tablet, Rfl: 1   methocarbamol (ROBAXIN) 500 MG tablet, Take 1 tablet (500 mg total) by mouth every 8 (eight) hours as needed for muscle spasms., Disp: 15 tablet, Rfl: 1   metolazone (ZAROXOLYN) 5 MG tablet, TAKE 1 TABLET EVERY MONDAY, WEDNESDAY, AND FRIDAY. (Patient taking differently: Take 5 mg by mouth See admin instructions. Take 1 tablet every Monday, Wednesday, and Friday.), Disp: 15 tablet, Rfl: 3   Misc. Devices MISC, Requires O2 @ 3L/min continuously via nasal canula and home fill system,. (Patient taking differently: 1 each by Other route See admin instructions. Requires O2 @ 3L/min continuously via nasal canula and home fill system,.), Disp: 1 each, Rfl: 0   Misc. Devices MISC, Please provide Mr. Mcsweeney with insurance approved portable O2 concentrator ICD 10 J96.11 [id number] (Patient taking differently: 1 each by Other route See admin instructions. Please provide Mr. Wavra with insurance approved portable O2 concentrator ICD 10 J96.11 Z99.81), Disp: 1 each, Rfl: 0   omeprazole (PRILOSEC) 20 MG capsule, TAKE 1 CAPSULE (20 MG TOTAL) BY MOUTH 2 (TWO) TIMES DAILY BEFORE A MEAL. TAKE FIRST CAPSULE 30 MIN PRIOR TO EATING OR TAKING OTHER MEDICATIONS., Disp: 60 capsule, Rfl: 2   potassium chloride SA (KLOR-CON) 10 MEQ tablet, Take 60 meq (6 tabs) in the morning, 60 meq (6 tabs) in the afternoon and 40 meq (4 tabs) in the evening.  Take an extra 4 tabs every dose of metolazone (Patient taking differently: Take 40-60 mEq by mouth See admin instructions. Take 60 meq (6 tabs) in the morning, 60 meq (6 tabs) in the afternoon and 40 meq (4 tabs) in the evening.  Take an extra 4 tabs every dose of metolazone), Disp: 540 tablet, Rfl: 3   PROVENTIL HFA 108 (90 Base) MCG/ACT inhaler, INHALE 2 PUFFS BY MOUTH EVERY 6 (SIX) HOURS AS NEEDED FOR WHEEZING OR SHORTNESS OF BREATH., Disp: 6.7 g, Rfl: 2   rivaroxaban  (XARELTO) 20 MG TABS tablet, Take 1 tablet (20 mg total) by mouth daily with supper., Disp: 60 tablet, Rfl: 6   spironolactone (ALDACTONE) 50 MG tablet, TAKE 1 TABLET (50 MG TOTAL) BY MOUTH DAILY., Disp: 30 tablet, Rfl: 3   torsemide (DEMADEX) 100 MG tablet, Take 1 tablet (100 mg total) by mouth 2 (two) times daily., Disp: 180 tablet, Rfl: 3   TRUEplus Lancets 28G MISC, Use as instructed. Check blood glucose level by fingerstick twice per day. (Patient taking differently: 1 each by Other route See admin instructions. Use as instructed. Check blood glucose level by fingerstick twice per day.), Disp: 200 each, Rfl: 3 No Known Allergies   Social History   Socioeconomic History   Marital status: Single    Spouse name: Not on file   Number of children: Not on file   Years of education: Not on file   Highest education level: Not on file  Occupational History   Not on file  Tobacco Use  Smoking status: Former Smoker   Smokeless tobacco: Never Used  Scientific laboratory technician Use: Never used  Substance and Sexual Activity   Alcohol use: Not Currently   Drug use: Not Currently   Sexual activity: Not Currently  Other Topics Concern   Not on file  Social History Narrative   Not on file   Social Determinants of Health   Financial Resource Strain: Medium Risk   Difficulty of Paying Living Expenses: Somewhat hard  Food Insecurity: Food Insecurity Present   Worried About Charity fundraiser in the Last Year: Sometimes true   Ran Out of Food in the Last Year: Sometimes true  Transportation Needs: No Transportation Needs   Lack of Transportation (Medical): No   Lack of Transportation (Non-Medical): No  Physical Activity:    Days of Exercise per Week:    Minutes of Exercise per Session:   Stress:    Feeling of Stress :   Social Connections:    Frequency of Communication with Friends and Family:    Frequency of Social Gatherings with Friends and Family:    Attends  Religious Services:    Active Member of Clubs or Organizations:    Attends Archivist Meetings:    Marital Status:   Intimate Partner Violence:    Fear of Current or Ex-Partner:    Emotionally Abused:    Physically Abused:    Sexually Abused:     Physical Exam Vitals reviewed.  Constitutional:      Appearance: He is normal weight.  HENT:     Head: Normocephalic.     Nose: Nose normal.     Mouth/Throat:     Mouth: Mucous membranes are moist.     Pharynx: Oropharynx is clear.  Eyes:     Pupils: Pupils are equal, round, and reactive to light.  Cardiovascular:     Rate and Rhythm: Normal rate and regular rhythm.     Pulses: Normal pulses.     Heart sounds: Normal heart sounds.  Pulmonary:     Effort: Pulmonary effort is normal.     Breath sounds: Normal breath sounds.  Abdominal:     Palpations: Abdomen is soft.  Musculoskeletal:        General: Normal range of motion.     Cervical back: Normal range of motion.     Right lower leg: Edema present.     Left lower leg: Edema present.  Skin:    General: Skin is warm and dry.     Capillary Refill: Capillary refill takes less than 2 seconds.  Neurological:     Mental Status: He is alert. Mental status is at baseline.  Psychiatric:        Mood and Affect: Mood normal.    Arrived for home visit for Bessie who met me at the door, alert and oriented reporting he has a headache and is really sleepy. Theodus reports he went to a party over the weekend and drank some alcoholic punch with pills mixed in it. He reports the drugs were cocaine, roxy's and other pain killers. Chioke reports he feels really bad since the party. Amandeep missed a few doses of his medications over the weekend as well. Vitals obtained. BP noted to be elevated. Medications were reviewed and confirmed. Today's medications were [facility] to [facility]. Pill box filled accordingly. Verlon was advised if he didn't start feeling better in the next day or  two to let me know. He agreed. Home visit complete.  I will see him in one week on Monday at 1000.   Refills: Spironolactone and Metolazone   CBG- 148   Future Appointments  Date Time Provider [place]  04/17/2020 11:10 AM [name], NP [facility] None  07/02/2020  1:30 PM [facility] PA/NP [facility] None     ACTION: Home visit completed Next visit planned for one week

## 2020-04-16 ENCOUNTER — Encounter (HOSPITAL_COMMUNITY): Payer: Medicaid Other

## 2020-04-17 ENCOUNTER — Encounter: Payer: Self-pay | Admitting: Nurse Practitioner

## 2020-04-17 ENCOUNTER — Other Ambulatory Visit: Payer: Self-pay

## 2020-04-17 ENCOUNTER — Ambulatory Visit: Payer: Medicaid Other | Attending: Nurse Practitioner | Admitting: Nurse Practitioner

## 2020-04-17 DIAGNOSIS — I5033 Acute on chronic diastolic (congestive) heart failure: Secondary | ICD-10-CM

## 2020-04-17 DIAGNOSIS — I10 Essential (primary) hypertension: Secondary | ICD-10-CM | POA: Diagnosis not present

## 2020-04-17 NOTE — Progress Notes (Signed)
Virtual Visit via Telephone Note Due to national recommendations of social distancing due to COVID 19, telehealth visit is felt to be most appropriate for this patient at this time.  I discussed the limitations, risks, security and privacy concerns of performing an evaluation and management service by telephone and the availability of in person appointments. I also discussed with the patient that there may be a patient responsible charge related to this service. The patient expressed understanding and agreed to proceed.    I connected with Benjamin [name] on 04/17/20  at  11:10 AM EDT  EDT by telephone and verified that I am speaking with the correct person using two identifiers.   Consent I discussed the limitations, risks, security and privacy concerns of performing an evaluation and management service by telephone and the availability of in person appointments. I also discussed with the patient that there may be a patient responsible charge related to this service. The patient expressed understanding and agreed to proceed.   Location of Patient: Private Residence   Location of Provider: [facility]    Persons participating in Telemedicine visit: [name] FNP-BC YY Taylor CMA Benjamin [name]    History of Present Illness: Telemedicine visit for: F/U PMH of diastolic CHF, HTN, DM, Atrial fib, PE (on Xarelto), OSA, (compliant with CPAP)  GERD, chronic elevation of troponin. He is being followed closely by the heart failure program and Paramedic with last visit on 04-14-2020. Blood pressure is significantly elevated. Also endorses BLE edema. Needs a new pair of compression stockings. Unfortunately he is not consistently diet adherent. He does not prepare his own food and depends on someone else. Had spaghetti last night.  Current medication include amlodipine 10 mg daily, cardura 1 mg daily, labetalol 400 mg BID, losartan 100 mg daily, zaroxolyn 5 mg  three times per week, spironolactone 60 mg daily and torsemide 100 mg BID.   DM well controlled at this time. LDL not at goal. Likely due to dietary non adherence.  Lab Results  Component Value Date   HGBA1C 6.2 (A) 01/17/2020   Lab Results  Component Value Date   LDLCALC 110 (H) 01/17/2020    Past Medical History:  Diagnosis Date  . A-fib (HCC)   . Anemia 05/29/2019  . CHF (congestive heart failure) (HCC)   . Chronic respiratory failure (HCC)   . Diabetes mellitus without complication (HCC)   . Dyspnea   . Elevated troponin 02/06/2019  . Hypertension   . Obesity   . Pulmonary embolism (HCC)   . Rectal bleeding 05/29/2019  . Sleep apnea     Past Surgical History:  Procedure Laterality Date  . COLONOSCOPY WITH PROPOFOL Left 02/25/2019   Procedure: COLONOSCOPY WITH PROPOFOL;  Surgeon: [name], MD;  Location: [facility] ENDOSCOPY;  Service: Endoscopy;  Laterality: Left;  . ESOPHAGOGASTRODUODENOSCOPY (EGD) WITH PROPOFOL Left 02/25/2019   Procedure: ESOPHAGOGASTRODUODENOSCOPY (EGD) WITH PROPOFOL;  Surgeon: [name], MD;  Location: [facility] ENDOSCOPY;  Service: Endoscopy;  Laterality: Left;  . ESOPHAGOGASTRODUODENOSCOPY (EGD) WITH PROPOFOL N/A 06/12/2019   Procedure: ESOPHAGOGASTRODUODENOSCOPY (EGD) WITH PROPOFOL;  Surgeon: Vida Rigger, MD;  Location: [facility] ENDOSCOPY;  Service: Gastroenterology;  Laterality: N/A;  . FLEXIBLE SIGMOIDOSCOPY N/A 06/12/2019   Procedure: FLEXIBLE SIGMOIDOSCOPY;  Surgeon: Vida Rigger, MD;  Location: [facility] ENDOSCOPY;  Service: Gastroenterology;  Laterality: N/A;  . HEMORRHOID BANDING  05/2019  . NO PAST SURGERIES    . RIGHT HEART CATH N/A 04/17/2019   Procedure: RIGHT HEART CATH;  Surgeon:  Laurey Morale, MD;  Location: [facility] INVASIVE CV LAB;  Service: Cardiovascular;  Laterality: N/A;    Family History  Problem Relation Age of Onset  . Hypertension Mother   . Diabetes Mother     Social History   Socioeconomic History  . Marital status: Single    Spouse name:  Not on file  . Number of children: Not on file  . Years of education: Not on file  . Highest education level: Not on file  Occupational History  . Not on file  Tobacco Use  . Smoking status: Former Games developer  . Smokeless tobacco: Never Used  Vaping Use  . Vaping Use: Never used  Substance and Sexual Activity  . Alcohol use: Not Currently  . Drug use: Not Currently  . Sexual activity: Not Currently  Other Topics Concern  . Not on file  Social History Narrative  . Not on file   Social Determinants of Health   Financial Resource Strain: Medium Risk  . Difficulty of Paying Living Expenses: Somewhat hard  Food Insecurity: Food Insecurity Present  . Worried About Programme researcher, broadcasting/film/video in the Last Year: Sometimes true  . Ran Out of Food in the Last Year: Sometimes true  Transportation Needs: No Transportation Needs  . Lack of Transportation (Medical): No  . Lack of Transportation (Non-Medical): No  Physical Activity:   . Days of Exercise per Week:   . Minutes of Exercise per Session:   Stress:   . Feeling of Stress :   Social Connections:   . Frequency of Communication with Friends and Family:   . Frequency of Social Gatherings with Friends and Family:   . Attends Religious Services:   . Active Member of Clubs or Organizations:   . Attends Banker Meetings:   Marland Kitchen Marital Status:      Observations/Objective: Awake, alert and oriented x 3   Review of Systems  Constitutional: Negative for fever, malaise/fatigue and weight loss.  HENT: Negative.  Negative for nosebleeds.   Eyes: Negative.  Negative for blurred vision, double vision and photophobia.  Respiratory: Positive for shortness of breath (with exertion and high temperatures). Negative for cough.   Cardiovascular: Positive for leg swelling. Negative for chest pain and palpitations.  Gastrointestinal: Negative.  Negative for heartburn, nausea and vomiting.  Musculoskeletal: Negative.  Negative for myalgias.   Neurological: Negative.  Negative for dizziness, focal weakness, seizures and headaches.  Psychiatric/Behavioral: Negative.  Negative for suicidal ideas.    Assessment and Plan: Benjamin [name] was seen today for follow-up.  Diagnoses and all orders for this visit:  Essential hypertension Continue all antihypertensives as prescribed.  Remember to bring in your blood pressure log with you for your follow up appointment.  DASH/Mediterranean Diets are healthier choices for HTN.    Acute on chronic diastolic heart failure (HCC) -     Misc. Devices MISC; Please provide patient with XL sized insurance approved compression stockings. ICD 10 R60.0 and  I50.32     Follow Up Instructions Return in about 6 weeks (around 05/29/2020).     I discussed the assessment and treatment plan with the patient. The patient was provided an opportunity to ask questions and all were answered. The patient agreed with the plan and demonstrated an understanding of the instructions.   The patient was advised to call back or seek an in-person evaluation if the symptoms worsen or if the condition fails to improve as anticipated.  I provided 16 minutes of non-face-to-face time  during this encounter including median intraservice time, reviewing previous notes, labs, imaging, medications and explaining diagnosis and management.  Savaya Hakes W Christopher Glasscock, FNP-BC 

## 2020-04-19 ENCOUNTER — Encounter: Payer: Self-pay | Admitting: Nurse Practitioner

## 2020-04-19 MED ORDER — MISC. DEVICES MISC: 0 refills | Status: DC

## 2020-04-20 ENCOUNTER — Other Ambulatory Visit (HOSPITAL_COMMUNITY): Payer: Self-pay

## 2020-04-20 ENCOUNTER — Other Ambulatory Visit (HOSPITAL_COMMUNITY): Payer: Self-pay | Admitting: Adult Health

## 2020-04-20 ENCOUNTER — Other Ambulatory Visit (HOSPITAL_COMMUNITY): Payer: Self-pay | Admitting: Cardiology

## 2020-04-20 DIAGNOSIS — E876 Hypokalemia: Secondary | ICD-10-CM

## 2020-04-20 DIAGNOSIS — I10 Essential (primary) hypertension: Secondary | ICD-10-CM

## 2020-04-20 NOTE — Progress Notes (Signed)
Paramedicine Encounter    Patient ID: Benjamin [name], male    DOB: 03-21-1966, 54 y.o.   MRN: [id number]   Patient Care Team: [name], NP as PCP - General (Nurse Practitioner) [name], MD as PCP - Cardiology (Cardiology) [name], LCSW as Social Worker (Licensed Clinical Social Worker)  Patient Active Problem List   Diagnosis Date Noted  . Acute on chronic diastolic heart failure ([place]) 02/24/2020  . Diabetic neuropathy ([place]) 11/20/2019  . Coagulation disorder ([place]) 11/20/2019  . Acute on chronic diastolic CHF (congestive heart failure), NYHA class 3 ([place]) 07/30/2019  . Diabetes mellitus without complication ([place]) 93/71/6967  . Hypertensive emergency   . BRBPR (bright red blood per rectum) 06/12/2019  . Hypertension   . CHF (congestive heart failure) ([place])   . Sleep apnea   . Chronic respiratory failure ([place])   . Hypokalemia 05/29/2019  . GI bleed 05/29/2019  . Anemia 05/28/2019  . Acute on chronic diastolic CHF (congestive heart failure) ([place]) 04/11/2019  . Iron deficiency anemia 03/11/2019  . OSA on CPAP 03/11/2019  . HLD (hyperlipidemia) 03/11/2019  . Elevated troponin 03/11/2019  . GERD (gastroesophageal reflux disease) 03/11/2019  . Rectal bleeding 02/20/2019  . Dyspnea 02/06/2019  . COVID-19 virus infection 02/06/2019  . Bilateral lower extremity edema   . Morbid obesity with BMI of 50.0-59.9, adult ([place])   . Chronic diastolic CHF (congestive heart failure) ([place])   . PAF (paroxysmal atrial fibrillation) ([place])   . PE (pulmonary thromboembolism) ([place]) 01/21/2019  . Hypertensive urgency 01/21/2019  . Diabetes mellitus type 2 in obese ([place]) 01/21/2019    Current Outpatient Medications:  .  amLODipine (NORVASC) 10 MG tablet, TAKE ONE TABLET BY MOUTH ONCE DAILY, Disp: 30 tablet, Rfl: 2 .  atorvastatin (LIPITOR) 40 MG tablet, Take 1 tablet (40 mg total) by mouth at bedtime., Disp: 90 tablet, Rfl: 2 .  Blood Glucose Monitoring Suppl (TRUE METRIX  METER) w/Device KIT, Use as instructed. Check blood glucose level by fingerstick twice per day. (Patient taking differently: 1 each by Other route See admin instructions. Use as instructed. Check blood glucose level by fingerstick twice per day.), Disp: 1 kit, Rfl: 0 .  Blood Pressure Monitor DEVI, Please provide patient with insurance approved blood pressure device with L-XL cuff. BMI 55 (Patient taking differently: 1 each by Other route See admin instructions. Please provide patient with insurance approved blood pressure device with L-XL cuff. BMI 55), Disp: 1 each, Rfl: 0 .  carboxymethylcellulose (REFRESH PLUS) 0.5 % SOLN, Place 1 drop into both eyes daily. , Disp: , Rfl:  .  docusate sodium (COLACE) 100 MG capsule, Take 100 mg by mouth 2 (two) times daily., Disp: , Rfl:  .  doxazosin (CARDURA) 1 MG tablet, Take 1 tablet (1 mg total) by mouth daily., Disp: 30 tablet, Rfl: 11 .  ferrous sulfate 325 (65 FE) MG tablet, Take 1 tablet (325 mg total) by mouth daily. Patient will pick up scripts today. (Patient taking differently: Take 325 mg by mouth daily. ), Disp: 90 tablet, Rfl: 0 .  glucose blood (TRUE METRIX BLOOD GLUCOSE TEST) test strip, Use as instructed. Check blood glucose level by fingerstick twice per day. (Patient taking differently: 1 each by Other route See admin instructions. Use as instructed. Check blood glucose level by fingerstick twice per day.), Disp: 100 each, Rfl: 12 .  labetalol (NORMODYNE) 200 MG tablet, Take 2 tablets (400 mg total) by mouth 2 (two) times daily., Disp: 120  tablet, Rfl: 3 .  losartan (COZAAR) 100 MG tablet, Take 1 tablet (100 mg total) by mouth daily. (Patient taking differently: Take 100 mg by mouth at bedtime. ), Disp: 30 tablet, Rfl: 5 .  magnesium oxide (MAG-OX) 400 MG tablet, Take 2 tablets (800 mg total) by mouth every morning AND 1 tablet (400 mg total) every evening., Disp: 90 tablet, Rfl: 6 .  metFORMIN (GLUCOPHAGE) 500 MG tablet, Take 1 tablet (500 mg  total) by mouth 2 (two) times daily with a meal. Patient will pick up scripts today., Disp: 180 tablet, Rfl: 1 .  methocarbamol (ROBAXIN) 500 MG tablet, Take 1 tablet (500 mg total) by mouth every 8 (eight) hours as needed for muscle spasms., Disp: 15 tablet, Rfl: 1 .  metolazone (ZAROXOLYN) 5 MG tablet, TAKE 1 TABLET EVERY MONDAY, WEDNESDAY, AND FRIDAY. (Patient taking differently: Take 5 mg by mouth See admin instructions. Take 1 tablet every Monday, Wednesday, and Friday.), Disp: 15 tablet, Rfl: 3 .  Misc. Devices MISC, Requires O2 @ 3L/min continuously via nasal canula and home fill system,. (Patient taking differently: 1 each by Other route See admin instructions. Requires O2 @ 3L/min continuously via nasal canula and home fill system,.), Disp: 1 each, Rfl: 0 .  Misc. Devices MISC, Please provide Mr. Madan with insurance approved portable O2 concentrator ICD 10 J96.11 [phone] (Patient taking differently: 1 each by Other route See admin instructions. Please provide Mr. Talcott with insurance approved portable O2 concentrator ICD 10 J96.11 Z99.81), Disp: 1 each, Rfl: 0 .  Misc. Devices MISC, Please provide patient with XL sized insurance approved compression stockings. ICD 10 R60.0 and  I50.32, Disp: 1 each, Rfl: 0 .  omeprazole (PRILOSEC) 20 MG capsule, TAKE 1 CAPSULE (20 MG TOTAL) BY MOUTH 2 (TWO) TIMES DAILY BEFORE A MEAL. TAKE FIRST CAPSULE 30 MIN PRIOR TO EATING OR TAKING OTHER MEDICATIONS., Disp: 60 capsule, Rfl: 2 .  potassium chloride SA (KLOR-CON) 10 MEQ tablet, Take 60 meq (6 tabs) in the morning, 60 meq (6 tabs) in the afternoon and 40 meq (4 tabs) in the evening.  Take an extra 4 tabs every dose of metolazone (Patient taking differently: Take 40-60 mEq by mouth See admin instructions. Take 60 meq (6 tabs) in the morning, 60 meq (6 tabs) in the afternoon and 40 meq (4 tabs) in the evening.  Take an extra 4 tabs every dose of metolazone), Disp: 540 tablet, Rfl: 3 .  PROVENTIL HFA 108 (90  Base) MCG/ACT inhaler, INHALE 2 PUFFS BY MOUTH EVERY 6 (SIX) HOURS AS NEEDED FOR WHEEZING OR SHORTNESS OF BREATH., Disp: 6.7 g, Rfl: 2 .  rivaroxaban (XARELTO) 20 MG TABS tablet, Take 1 tablet (20 mg total) by mouth daily with supper., Disp: 60 tablet, Rfl: 6 .  spironolactone (ALDACTONE) 50 MG tablet, TAKE 1 TABLET (50 MG TOTAL) BY MOUTH DAILY., Disp: 30 tablet, Rfl: 3 .  torsemide (DEMADEX) 100 MG tablet, Take 1 tablet (100 mg total) by mouth 2 (two) times daily., Disp: 180 tablet, Rfl: 3 .  TRUEplus Lancets 28G MISC, Use as instructed. Check blood glucose level by fingerstick twice per day. (Patient taking differently: 1 each by Other route See admin instructions. Use as instructed. Check blood glucose level by fingerstick twice per day.), Disp: 200 each, Rfl: 3 No Known Allergies   Social History   Socioeconomic History  . Marital status: Single    Spouse name: Not on file  . Number of children: Not on file  . Years  of education: Not on file  . Highest education level: Not on file  Occupational History  . Not on file  Tobacco Use  . Smoking status: Former Research scientist (life sciences)  . Smokeless tobacco: Never Used  Vaping Use  . Vaping Use: Never used  Substance and Sexual Activity  . Alcohol use: Not Currently  . Drug use: Not Currently  . Sexual activity: Not Currently  Other Topics Concern  . Not on file  Social History Narrative  . Not on file   Social Determinants of Health   Financial Resource Strain: Medium Risk  . Difficulty of Paying Living Expenses: Somewhat hard  Food Insecurity: Food Insecurity Present  . Worried About Charity fundraiser in the Last Year: Sometimes true  . Ran Out of Food in the Last Year: Sometimes true  Transportation Needs: No Transportation Needs  . Lack of Transportation (Medical): No  . Lack of Transportation (Non-Medical): No  Physical Activity:   . Days of Exercise per Week:   . Minutes of Exercise per Session:   Stress:   . Feeling of Stress :    Social Connections:   . Frequency of Communication with Friends and Family:   . Frequency of Social Gatherings with Friends and Family:   . Attends Religious Services:   . Active Member of Clubs or Organizations:   . Attends Archivist Meetings:   Marland Kitchen Marital Status:   Intimate Partner Violence:   . Fear of Current or Ex-Partner:   . Emotionally Abused:   Marland Kitchen Physically Abused:   . Sexually Abused:     Physical Exam Vitals reviewed.  Constitutional:      Appearance: He is normal weight.  HENT:     Head: Normocephalic.     Nose: Nose normal.     Mouth/Throat:     Mouth: Mucous membranes are moist.     Pharynx: Oropharynx is clear.  Eyes:     Pupils: Pupils are equal, round, and reactive to light.  Cardiovascular:     Rate and Rhythm: Normal rate and regular rhythm.     Pulses: Normal pulses.     Heart sounds: Normal heart sounds.  Pulmonary:     Effort: Pulmonary effort is normal.     Breath sounds: Normal breath sounds.  Abdominal:     Palpations: Abdomen is soft.  Musculoskeletal:        General: Swelling present. Normal range of motion.     Cervical back: Normal range of motion.     Right lower leg: Edema present.     Left lower leg: Edema present.  Skin:    General: Skin is warm and dry.     Capillary Refill: Capillary refill takes less than 2 seconds.  Neurological:     Mental Status: He is alert. Mental status is at baseline.  Psychiatric:        Mood and Affect: Mood normal.    Arrived for Mr. Garringer today who was seated in his chair in his room alert and oriented reporting he feels much better this week than last week. Mr. Mehaffey remained alert and oriented throughout our visit. Adiel denied shortness of breath, dizziness, chest pain, headaches, nausea or vomiting. Vitals were obtained and are as noted. BP reading was better than last week but still elevated. Medications were reviewed and confirmed. Pill box filled accordingly. The following meds  are missing from pill box:  Potassium:  Friday- Noon and Bedtime  Saturday- Noon, Eve, Bedtime  Metolazone: MWF   Refills Called In: Potassium Metolazone Labetalol Cardura (Cannot be filled until the 12th)    Katie will follow up in one week.  Housing paperwork delivered to [facility].     Future Appointments  Date Time Provider [place]  07/02/2020  1:30 PM [facility] PA/NP [facility] None  07/15/2020  9:30 AM [name], NP [facility] None     ACTION: Home visit completed

## 2020-04-22 ENCOUNTER — Other Ambulatory Visit (HOSPITAL_COMMUNITY): Payer: Self-pay

## 2020-04-22 NOTE — Progress Notes (Signed)
Came out today for med rec. I went to p/u meds from pharmacy yesterday, pt was not home yesterday so I came out today. He has not taken his meds the past few days, he was missing potassium and metolazone in a few spots so all that was corrected.  Will f/u on Monday. He had no complaints today.   Kerry Hough, EMT-Paramedic  04/22/20

## 2020-04-27 ENCOUNTER — Other Ambulatory Visit (HOSPITAL_COMMUNITY): Payer: Self-pay

## 2020-04-27 NOTE — Progress Notes (Addendum)
Paramedicine Encounter    Patient ID: Benjamin [name], male    DOB: 1965/12/21, 54 y.o.   MRN: [id number]   Patient Care Team: [name], NP as PCP - General (Nurse Practitioner) [name], MD as PCP - Cardiology (Cardiology) [name], LCSW as Social Worker (Licensed Clinical Social Worker)  Patient Active Problem List   Diagnosis Date Noted  . Acute on chronic diastolic heart failure ([place]) 02/24/2020  . Diabetic neuropathy ([place]) 11/20/2019  . Coagulation disorder ([place]) 11/20/2019  . Acute on chronic diastolic CHF (congestive heart failure), NYHA class 3 ([place]) 07/30/2019  . Diabetes mellitus without complication ([place]) 02/33/4356  . Hypertensive emergency   . BRBPR (bright red blood per rectum) 06/12/2019  . Hypertension   . CHF (congestive heart failure) ([place])   . Sleep apnea   . Chronic respiratory failure ([place])   . Hypokalemia 05/29/2019  . GI bleed 05/29/2019  . Anemia 05/28/2019  . Acute on chronic diastolic CHF (congestive heart failure) ([place]) 04/11/2019  . Iron deficiency anemia 03/11/2019  . OSA on CPAP 03/11/2019  . HLD (hyperlipidemia) 03/11/2019  . Elevated troponin 03/11/2019  . GERD (gastroesophageal reflux disease) 03/11/2019  . Rectal bleeding 02/20/2019  . Dyspnea 02/06/2019  . COVID-19 virus infection 02/06/2019  . Bilateral lower extremity edema   . Morbid obesity with BMI of 50.0-59.9, adult ([place])   . Chronic diastolic CHF (congestive heart failure) ([place])   . PAF (paroxysmal atrial fibrillation) ([place])   . PE (pulmonary thromboembolism) ([place]) 01/21/2019  . Hypertensive urgency 01/21/2019  . Diabetes mellitus type 2 in obese ([place]) 01/21/2019    Current Outpatient Medications:  .  amLODipine (NORVASC) 10 MG tablet, TAKE ONE TABLET BY MOUTH ONCE DAILY, Disp: 30 tablet, Rfl: 2 .  Blood Glucose Monitoring Suppl (TRUE METRIX METER) w/Device KIT, Use as instructed. Check blood glucose level by fingerstick twice per day. (Patient taking  differently: 1 each by Other route See admin instructions. Use as instructed. Check blood glucose level by fingerstick twice per day.), Disp: 1 kit, Rfl: 0 .  Blood Pressure Monitor DEVI, Please provide patient with insurance approved blood pressure device with L-XL cuff. BMI 55 (Patient taking differently: 1 each by Other route See admin instructions. Please provide patient with insurance approved blood pressure device with L-XL cuff. BMI 55), Disp: 1 each, Rfl: 0 .  carboxymethylcellulose (REFRESH PLUS) 0.5 % SOLN, Place 1 drop into both eyes daily. , Disp: , Rfl:  .  docusate sodium (COLACE) 100 MG capsule, Take 100 mg by mouth 2 (two) times daily., Disp: , Rfl:  .  doxazosin (CARDURA) 1 MG tablet, Take 1 tablet (1 mg total) by mouth daily., Disp: 30 tablet, Rfl: 11 .  ferrous sulfate 325 (65 FE) MG tablet, Take 1 tablet (325 mg total) by mouth daily. Patient will pick up scripts today. (Patient taking differently: Take 325 mg by mouth daily. ), Disp: 90 tablet, Rfl: 0 .  glucose blood (TRUE METRIX BLOOD GLUCOSE TEST) test strip, Use as instructed. Check blood glucose level by fingerstick twice per day. (Patient taking differently: 1 each by Other route See admin instructions. Use as instructed. Check blood glucose level by fingerstick twice per day.), Disp: 100 each, Rfl: 12 .  labetalol (NORMODYNE) 200 MG tablet, TAKE 2 TABLETS (400 MG TOTAL) BY MOUTH 2 (TWO) TIMES DAILY., Disp: 120 tablet, Rfl: 3 .  losartan (COZAAR) 100 MG tablet, Take 1 tablet (100 mg total) by mouth daily. (Patient taking differently:  Take 100 mg by mouth at bedtime. ), Disp: 30 tablet, Rfl: 5 .  magnesium oxide (MAG-OX) 400 MG tablet, Take 2 tablets (800 mg total) by mouth every morning AND 1 tablet (400 mg total) every evening., Disp: 90 tablet, Rfl: 6 .  metFORMIN (GLUCOPHAGE) 500 MG tablet, Take 1 tablet (500 mg total) by mouth 2 (two) times daily with a meal. Patient will pick up scripts today., Disp: 180 tablet, Rfl: 1 .   metolazone (ZAROXOLYN) 5 MG tablet, TAKE 1 TABLET EVERY MONDAY, WEDNESDAY, AND FRIDAY. (Patient taking differently: Take 5 mg by mouth See admin instructions. Take 1 tablet every Monday, Wednesday, and Friday.), Disp: 15 tablet, Rfl: 3 .  Misc. Devices MISC, Requires O2 @ 3L/min continuously via nasal canula and home fill system,. (Patient taking differently: 1 each by Other route See admin instructions. Requires O2 @ 3L/min continuously via nasal canula and home fill system,.), Disp: 1 each, Rfl: 0 .  Misc. Devices MISC, Please provide Mr. Arons with insurance approved portable O2 concentrator ICD 10 J96.11 [phone] (Patient taking differently: 1 each by Other route See admin instructions. Please provide Mr. Krauser with insurance approved portable O2 concentrator ICD 10 J96.11 Z99.81), Disp: 1 each, Rfl: 0 .  Misc. Devices MISC, Please provide patient with XL sized insurance approved compression stockings. ICD 10 R60.0 and  I50.32, Disp: 1 each, Rfl: 0 .  omeprazole (PRILOSEC) 20 MG capsule, TAKE 1 CAPSULE (20 MG TOTAL) BY MOUTH 2 (TWO) TIMES DAILY BEFORE A MEAL. TAKE FIRST CAPSULE 30 MIN PRIOR TO EATING OR TAKING OTHER MEDICATIONS., Disp: 60 capsule, Rfl: 2 .  potassium chloride (KLOR-CON) 10 MEQ tablet, TAKE 60 MEQ (6 TABS) IN THE MORNING, 60 MEQ (6 TABS) IN THE AFTERNOON AND 40 MEQ (4 TABS) IN THE EVENING. TAKE AN EXTRA 4 TABS EVERY DOSE OF METOLAZONE, Disp: 540 tablet, Rfl: 3 .  PROVENTIL HFA 108 (90 Base) MCG/ACT inhaler, INHALE 2 PUFFS BY MOUTH EVERY 6 (SIX) HOURS AS NEEDED FOR WHEEZING OR SHORTNESS OF BREATH., Disp: 6.7 g, Rfl: 2 .  rivaroxaban (XARELTO) 20 MG TABS tablet, Take 1 tablet (20 mg total) by mouth daily with supper., Disp: 60 tablet, Rfl: 6 .  spironolactone (ALDACTONE) 50 MG tablet, TAKE 1 TABLET (50 MG TOTAL) BY MOUTH DAILY., Disp: 30 tablet, Rfl: 3 .  torsemide (DEMADEX) 100 MG tablet, Take 1 tablet (100 mg total) by mouth 2 (two) times daily., Disp: 180 tablet, Rfl: 3 .  TRUEplus  Lancets 28G MISC, Use as instructed. Check blood glucose level by fingerstick twice per day. (Patient taking differently: 1 each by Other route See admin instructions. Use as instructed. Check blood glucose level by fingerstick twice per day.), Disp: 200 each, Rfl: 3 .  atorvastatin (LIPITOR) 40 MG tablet, Take 1 tablet (40 mg total) by mouth at bedtime., Disp: 90 tablet, Rfl: 2 .  methocarbamol (ROBAXIN) 500 MG tablet, Take 1 tablet (500 mg total) by mouth every 8 (eight) hours as needed for muscle spasms. (Patient not taking: Reported on 04/22/2020), Disp: 15 tablet, Rfl: 1 No Known Allergies    Social History   Socioeconomic History  . Marital status: Single    Spouse name: Not on file  . Number of children: Not on file  . Years of education: Not on file  . Highest education level: Not on file  Occupational History  . Not on file  Tobacco Use  . Smoking status: Former Research scientist (life sciences)  . Smokeless tobacco: Never Used  Vaping Use  .  Vaping Use: Never used  Substance and Sexual Activity  . Alcohol use: Not Currently  . Drug use: Not Currently  . Sexual activity: Not Currently  Other Topics Concern  . Not on file  Social History Narrative  . Not on file   Social Determinants of Health   Financial Resource Strain: Medium Risk  . Difficulty of Paying Living Expenses: Somewhat hard  Food Insecurity: Food Insecurity Present  . Worried About Charity fundraiser in the Last Year: Sometimes true  . Ran Out of Food in the Last Year: Sometimes true  Transportation Needs: No Transportation Needs  . Lack of Transportation (Medical): No  . Lack of Transportation (Non-Medical): No  Physical Activity:   . Days of Exercise per Week:   . Minutes of Exercise per Session:   Stress:   . Feeling of Stress :   Social Connections:   . Frequency of Communication with Friends and Family:   . Frequency of Social Gatherings with Friends and Family:   . Attends Religious Services:   . Active Member of  Clubs or Organizations:   . Attends Archivist Meetings:   Marland Kitchen Marital Status:   Intimate Partner Violence:   . Fear of Current or Ex-Partner:   . Emotionally Abused:   Marland Kitchen Physically Abused:   . Sexually Abused:     Physical Exam      Future Appointments  Date Time Provider [place]  07/02/2020  1:30 PM [facility] PA/NP [facility] None  07/15/2020  9:30 AM [name], NP [facility] None    BP (!) 182/118   Pulse 94   Temp 98.7 F (37.1 C)   Resp (!) 22   SpO2 94%   Weight yesterday-394 Last visit weight-393  Pt reports he is tired, he just woke when I arrived. He states he has been bust doing stuff and staying up late. He is falling asleep during our visit. He denies increased sob. He states he is using his CPAP machine. But with as tired as he is im not sure if he is using it at all times.  No edema noted. no bleeding in urine/stool. No c/p, no dizziness.  He states the [employer] called him today to let him know she would calhim on Thursday to let him know where he stands on the wait list for housing.  meds verified and pill box refilled. He missed yesterdays meds-all of them.  His b/p elevated but no meds last night.   Needs metformin, losartan, and doxazosin refilled   [name], [place] Paramedic  04/27/20

## 2020-04-30 ENCOUNTER — Other Ambulatory Visit (HOSPITAL_COMMUNITY): Payer: Self-pay | Admitting: Adult Health

## 2020-04-30 DIAGNOSIS — I5032 Chronic diastolic (congestive) heart failure: Secondary | ICD-10-CM

## 2020-05-02 DIAGNOSIS — I5032 Chronic diastolic (congestive) heart failure: Secondary | ICD-10-CM | POA: Diagnosis not present

## 2020-05-02 DIAGNOSIS — J9611 Chronic respiratory failure with hypoxia: Secondary | ICD-10-CM | POA: Diagnosis not present

## 2020-05-04 ENCOUNTER — Other Ambulatory Visit (HOSPITAL_COMMUNITY): Payer: Self-pay

## 2020-05-04 NOTE — Progress Notes (Signed)
Paramedicine Encounter    Patient ID: Benjamin [name], male    DOB: 1965/12/21, 54 y.o.   MRN: [id number]   Patient Care Team: [name], NP as PCP - General (Nurse Practitioner) [name], MD as PCP - Cardiology (Cardiology) [name], LCSW as Social Worker (Licensed Clinical Social Worker)  Patient Active Problem List   Diagnosis Date Noted  . Acute on chronic diastolic heart failure ([place]) 02/24/2020  . Diabetic neuropathy ([place]) 11/20/2019  . Coagulation disorder ([place]) 11/20/2019  . Acute on chronic diastolic CHF (congestive heart failure), NYHA class 3 ([place]) 07/30/2019  . Diabetes mellitus without complication ([place]) 02/33/4356  . Hypertensive emergency   . BRBPR (bright red blood per rectum) 06/12/2019  . Hypertension   . CHF (congestive heart failure) ([place])   . Sleep apnea   . Chronic respiratory failure ([place])   . Hypokalemia 05/29/2019  . GI bleed 05/29/2019  . Anemia 05/28/2019  . Acute on chronic diastolic CHF (congestive heart failure) ([place]) 04/11/2019  . Iron deficiency anemia 03/11/2019  . OSA on CPAP 03/11/2019  . HLD (hyperlipidemia) 03/11/2019  . Elevated troponin 03/11/2019  . GERD (gastroesophageal reflux disease) 03/11/2019  . Rectal bleeding 02/20/2019  . Dyspnea 02/06/2019  . COVID-19 virus infection 02/06/2019  . Bilateral lower extremity edema   . Morbid obesity with BMI of 50.0-59.9, adult ([place])   . Chronic diastolic CHF (congestive heart failure) ([place])   . PAF (paroxysmal atrial fibrillation) ([place])   . PE (pulmonary thromboembolism) ([place]) 01/21/2019  . Hypertensive urgency 01/21/2019  . Diabetes mellitus type 2 in obese ([place]) 01/21/2019    Current Outpatient Medications:  .  amLODipine (NORVASC) 10 MG tablet, TAKE ONE TABLET BY MOUTH ONCE DAILY, Disp: 30 tablet, Rfl: 2 .  Blood Glucose Monitoring Suppl (TRUE METRIX METER) w/Device KIT, Use as instructed. Check blood glucose level by fingerstick twice per day. (Patient taking  differently: 1 each by Other route See admin instructions. Use as instructed. Check blood glucose level by fingerstick twice per day.), Disp: 1 kit, Rfl: 0 .  Blood Pressure Monitor DEVI, Please provide patient with insurance approved blood pressure device with L-XL cuff. BMI 55 (Patient taking differently: 1 each by Other route See admin instructions. Please provide patient with insurance approved blood pressure device with L-XL cuff. BMI 55), Disp: 1 each, Rfl: 0 .  carboxymethylcellulose (REFRESH PLUS) 0.5 % SOLN, Place 1 drop into both eyes daily. , Disp: , Rfl:  .  docusate sodium (COLACE) 100 MG capsule, Take 100 mg by mouth 2 (two) times daily., Disp: , Rfl:  .  doxazosin (CARDURA) 1 MG tablet, Take 1 tablet (1 mg total) by mouth daily., Disp: 30 tablet, Rfl: 11 .  ferrous sulfate 325 (65 FE) MG tablet, Take 1 tablet (325 mg total) by mouth daily. Patient will pick up scripts today. (Patient taking differently: Take 325 mg by mouth daily. ), Disp: 90 tablet, Rfl: 0 .  glucose blood (TRUE METRIX BLOOD GLUCOSE TEST) test strip, Use as instructed. Check blood glucose level by fingerstick twice per day. (Patient taking differently: 1 each by Other route See admin instructions. Use as instructed. Check blood glucose level by fingerstick twice per day.), Disp: 100 each, Rfl: 12 .  labetalol (NORMODYNE) 200 MG tablet, TAKE 2 TABLETS (400 MG TOTAL) BY MOUTH 2 (TWO) TIMES DAILY., Disp: 120 tablet, Rfl: 3 .  losartan (COZAAR) 100 MG tablet, Take 1 tablet (100 mg total) by mouth daily. (Patient taking differently:  Take 100 mg by mouth at bedtime. ), Disp: 30 tablet, Rfl: 5 .  magnesium oxide (MAG-OX) 400 MG tablet, Take 2 tablets (800 mg total) by mouth every morning AND 1 tablet (400 mg total) every evening., Disp: 90 tablet, Rfl: 6 .  metFORMIN (GLUCOPHAGE) 500 MG tablet, Take 1 tablet (500 mg total) by mouth 2 (two) times daily with a meal. Patient will pick up scripts today., Disp: 180 tablet, Rfl: 1 .   metolazone (ZAROXOLYN) 5 MG tablet, TAKE 1 TABLET EVERY MONDAY, WEDNESDAY, AND FRIDAY. (Patient taking differently: Take 5 mg by mouth See admin instructions. Take 1 tablet every Monday, Wednesday, and Friday.), Disp: 15 tablet, Rfl: 3 .  Misc. Devices MISC, Requires O2 @ 3L/min continuously via nasal canula and home fill system,. (Patient taking differently: 1 each by Other route See admin instructions. Requires O2 @ 3L/min continuously via nasal canula and home fill system,.), Disp: 1 each, Rfl: 0 .  Misc. Devices MISC, Please provide Mr. Moorer with insurance approved portable O2 concentrator ICD 10 J96.11 [phone] (Patient taking differently: 1 each by Other route See admin instructions. Please provide Mr. Tortorella with insurance approved portable O2 concentrator ICD 10 J96.11 Z99.81), Disp: 1 each, Rfl: 0 .  Misc. Devices MISC, Please provide patient with XL sized insurance approved compression stockings. ICD 10 R60.0 and  I50.32, Disp: 1 each, Rfl: 0 .  omeprazole (PRILOSEC) 20 MG capsule, TAKE 1 CAPSULE (20 MG TOTAL) BY MOUTH 2 (TWO) TIMES DAILY BEFORE A MEAL. TAKE FIRST CAPSULE 30 MIN PRIOR TO EATING OR TAKING OTHER MEDICATIONS., Disp: 60 capsule, Rfl: 2 .  potassium chloride (KLOR-CON) 10 MEQ tablet, TAKE 60 MEQ (6 TABS) IN THE MORNING, 60 MEQ (6 TABS) IN THE AFTERNOON AND 40 MEQ (4 TABS) IN THE EVENING. TAKE AN EXTRA 4 TABS EVERY DOSE OF METOLAZONE, Disp: 540 tablet, Rfl: 3 .  PROVENTIL HFA 108 (90 Base) MCG/ACT inhaler, INHALE 2 PUFFS BY MOUTH EVERY 6 (SIX) HOURS AS NEEDED FOR WHEEZING OR SHORTNESS OF BREATH., Disp: 6.7 g, Rfl: 2 .  rivaroxaban (XARELTO) 20 MG TABS tablet, Take 1 tablet (20 mg total) by mouth daily with supper., Disp: 60 tablet, Rfl: 6 .  spironolactone (ALDACTONE) 50 MG tablet, TAKE 1 TABLET (50 MG TOTAL) BY MOUTH DAILY., Disp: 30 tablet, Rfl: 3 .  torsemide (DEMADEX) 100 MG tablet, Take 1 tablet (100 mg total) by mouth 2 (two) times daily., Disp: 180 tablet, Rfl: 3 .  TRUEplus  Lancets 28G MISC, Use as instructed. Check blood glucose level by fingerstick twice per day. (Patient taking differently: 1 each by Other route See admin instructions. Use as instructed. Check blood glucose level by fingerstick twice per day.), Disp: 200 each, Rfl: 3 .  atorvastatin (LIPITOR) 40 MG tablet, Take 1 tablet (40 mg total) by mouth at bedtime., Disp: 90 tablet, Rfl: 2 .  methocarbamol (ROBAXIN) 500 MG tablet, Take 1 tablet (500 mg total) by mouth every 8 (eight) hours as needed for muscle spasms. (Patient not taking: Reported on 04/22/2020), Disp: 15 tablet, Rfl: 1 No Known Allergies    Social History   Socioeconomic History  . Marital status: Single    Spouse name: Not on file  . Number of children: Not on file  . Years of education: Not on file  . Highest education level: Not on file  Occupational History  . Not on file  Tobacco Use  . Smoking status: Former Research scientist (life sciences)  . Smokeless tobacco: Never Used  Vaping Use  .  Vaping Use: Never used  Substance and Sexual Activity  . Alcohol use: Not Currently  . Drug use: Not Currently  . Sexual activity: Not Currently  Other Topics Concern  . Not on file  Social History Narrative  . Not on file   Social Determinants of Health   Financial Resource Strain: Medium Risk  . Difficulty of Paying Living Expenses: Somewhat hard  Food Insecurity: Food Insecurity Present  . Worried About Charity fundraiser in the Last Year: Sometimes true  . Ran Out of Food in the Last Year: Sometimes true  Transportation Needs: No Transportation Needs  . Lack of Transportation (Medical): No  . Lack of Transportation (Non-Medical): No  Physical Activity:   . Days of Exercise per Week:   . Minutes of Exercise per Session:   Stress:   . Feeling of Stress :   Social Connections:   . Frequency of Communication with Friends and Family:   . Frequency of Social Gatherings with Friends and Family:   . Attends Religious Services:   . Active Member of  Clubs or Organizations:   . Attends Archivist Meetings:   Marland Kitchen Marital Status:   Intimate Partner Violence:   . Fear of Current or Ex-Partner:   . Emotionally Abused:   Marland Kitchen Physically Abused:   . Sexually Abused:     Physical Exam      Future Appointments  Date Time Provider [place]  07/02/2020  1:30 PM [facility] PA/NP [facility] None  07/15/2020  9:30 AM [name], NP [facility] None    BP (!) 170/114   Pulse 84   Temp 98 F (36.7 C)   Resp 20   Wt (!) 395 lb (179.2 kg)   SpO2 97%   BMI 56.68 kg/m   Weight yesterday-? Last visit weight-393  Pt reports he is doing ok,  He states he is doing better and admitted that he knew he was taking those drugs a few wks ago b/c he was depressed and was really in a slump with his financial situation and his mother passing a few months ago and not being able to afford the gym membership.  He got a phone call from the apt complex and states he was #15 on the wait list.  He is going to a rehab soon once he gets the phone call to get it set up.  I will be switching him to a bubble pack for his meds--all except the potassium since it wont fit in the bubble pack.  He has missed last Mondays afternoon and bedtime doses and last thursdays. He states he has been checking his b/p at home and he reports high readings.  He states this week has been better for him, less stress.  He denies c/p, no sob. No dizziness, no bleeding issues. B/p elevated today. He has had high sodium foods too. So that hasnt helped with b/p.   [name], [facility] Paramedic  05/04/20

## 2020-05-11 ENCOUNTER — Other Ambulatory Visit (HOSPITAL_COMMUNITY): Payer: Self-pay

## 2020-05-11 DIAGNOSIS — J9611 Chronic respiratory failure with hypoxia: Secondary | ICD-10-CM | POA: Diagnosis not present

## 2020-05-11 DIAGNOSIS — I5032 Chronic diastolic (congestive) heart failure: Secondary | ICD-10-CM | POA: Diagnosis not present

## 2020-05-11 NOTE — Progress Notes (Signed)
Paramedicine Encounter    Patient ID: Benjamin [name], male    DOB: 1966/04/14, 54 y.o.   MRN: [id number]   Patient Care Team: [name], NP as PCP - General (Nurse Practitioner) [name], MD as PCP - Cardiology (Cardiology) [name], LCSW as Social Worker (Licensed Clinical Social Worker)  Patient Active Problem List   Diagnosis Date Noted  . Acute on chronic diastolic heart failure ([place]) 02/24/2020  . Diabetic neuropathy ([place]) 11/20/2019  . Coagulation disorder ([place]) 11/20/2019  . Acute on chronic diastolic CHF (congestive heart failure), NYHA class 3 ([place]) 07/30/2019  . Diabetes mellitus without complication ([place]) 24/40/1027  . Hypertensive emergency   . BRBPR (bright red blood per rectum) 06/12/2019  . Hypertension   . CHF (congestive heart failure) ([place])   . Sleep apnea   . Chronic respiratory failure ([place])   . Hypokalemia 05/29/2019  . GI bleed 05/29/2019  . Anemia 05/28/2019  . Acute on chronic diastolic CHF (congestive heart failure) ([place]) 04/11/2019  . Iron deficiency anemia 03/11/2019  . OSA on CPAP 03/11/2019  . HLD (hyperlipidemia) 03/11/2019  . Elevated troponin 03/11/2019  . GERD (gastroesophageal reflux disease) 03/11/2019  . Rectal bleeding 02/20/2019  . Dyspnea 02/06/2019  . COVID-19 virus infection 02/06/2019  . Bilateral lower extremity edema   . Morbid obesity with BMI of 50.0-59.9, adult ([place])   . Chronic diastolic CHF (congestive heart failure) ([place])   . PAF (paroxysmal atrial fibrillation) ([place])   . PE (pulmonary thromboembolism) ([place]) 01/21/2019  . Hypertensive urgency 01/21/2019  . Diabetes mellitus type 2 in obese ([name]) 01/21/2019    Current Outpatient Medications:  .  amLODipine (NORVASC) 10 MG tablet, TAKE ONE TABLET BY MOUTH ONCE DAILY, Disp: 30 tablet, Rfl: 2 .  Blood Glucose Monitoring Suppl (TRUE METRIX METER) w/Device KIT, Use as instructed. Check blood glucose level by fingerstick twice per day. (Patient taking  differently: 1 each by Other route See admin instructions. Use as instructed. Check blood glucose level by fingerstick twice per day.), Disp: 1 kit, Rfl: 0 .  Blood Pressure Monitor DEVI, Please provide patient with insurance approved blood pressure device with L-XL cuff. BMI 55 (Patient taking differently: 1 each by Other route See admin instructions. Please provide patient with insurance approved blood pressure device with L-XL cuff. BMI 55), Disp: 1 each, Rfl: 0 .  carboxymethylcellulose (REFRESH PLUS) 0.5 % SOLN, Place 1 drop into both eyes daily. , Disp: , Rfl:  .  docusate sodium (COLACE) 100 MG capsule, Take 100 mg by mouth 2 (two) times daily., Disp: , Rfl:  .  doxazosin (CARDURA) 1 MG tablet, Take 1 tablet (1 mg total) by mouth daily., Disp: 30 tablet, Rfl: 11 .  ferrous sulfate 325 (65 FE) MG tablet, Take 1 tablet (325 mg total) by mouth daily. Patient will pick up scripts today. (Patient taking differently: Take 325 mg by mouth daily. ), Disp: 90 tablet, Rfl: 0 .  glucose blood (TRUE METRIX BLOOD GLUCOSE TEST) test strip, Use as instructed. Check blood glucose level by fingerstick twice per day. (Patient taking differently: 1 each by Other route See admin instructions. Use as instructed. Check blood glucose level by fingerstick twice per day.), Disp: 100 each, Rfl: 12 .  labetalol (NORMODYNE) 200 MG tablet, TAKE 2 TABLETS (400 MG TOTAL) BY MOUTH 2 (TWO) TIMES DAILY., Disp: 120 tablet, Rfl: 3 .  losartan (COZAAR) 100 MG tablet, Take 1 tablet (100 mg total) by mouth daily., Disp: 30 tablet,  Rfl: 11 .  magnesium oxide (MAG-OX) 400 MG tablet, Take 2 tablets (800 mg total) by mouth every morning AND 1 tablet (400 mg total) every evening., Disp: 90 tablet, Rfl: 6 .  metFORMIN (GLUCOPHAGE) 500 MG tablet, Take 1 tablet (500 mg total) by mouth 2 (two) times daily with a meal. Patient will pick up scripts today., Disp: 180 tablet, Rfl: 1 .  metolazone (ZAROXOLYN) 5 MG tablet, TAKE 1 TABLET EVERY MONDAY,  WEDNESDAY, AND FRIDAY. (Patient taking differently: Take 5 mg by mouth See admin instructions. Take 1 tablet every Monday, Wednesday, and Friday.), Disp: 15 tablet, Rfl: 3 .  Misc. Devices MISC, Requires O2 @ 3L/min continuously via nasal canula and home fill system,. (Patient taking differently: 1 each by Other route See admin instructions. Requires O2 @ 3L/min continuously via nasal canula and home fill system,.), Disp: 1 each, Rfl: 0 .  Misc. Devices MISC, Please provide Benjamin [name] with insurance approved portable O2 concentrator ICD 10 J96.11 [phone] (Patient taking differently: 1 each by Other route See admin instructions. Please provide Benjamin [name] with insurance approved portable O2 concentrator ICD 10 J96.11 Z99.81), Disp: 1 each, Rfl: 0 .  Misc. Devices MISC, Please provide patient with XL sized insurance approved compression stockings. ICD 10 R60.0 and  I50.32, Disp: 1 each, Rfl: 0 .  PROVENTIL HFA 108 (90 Base) MCG/ACT inhaler, INHALE 2 PUFFS BY MOUTH EVERY 6 (SIX) HOURS AS NEEDED FOR WHEEZING OR SHORTNESS OF BREATH., Disp: 6.7 g, Rfl: 2 .  rivaroxaban (XARELTO) 20 MG TABS tablet, Take 1 tablet (20 mg total) by mouth daily with supper., Disp: 60 tablet, Rfl: 6 .  spironolactone (ALDACTONE) 50 MG tablet, TAKE 1 TABLET (50 MG TOTAL) BY MOUTH DAILY., Disp: 30 tablet, Rfl: 3 .  torsemide (DEMADEX) 100 MG tablet, Take 1 tablet (100 mg total) by mouth 2 (two) times daily., Disp: 180 tablet, Rfl: 3 .  TRUEplus Lancets 28G MISC, Use as instructed. Check blood glucose level by fingerstick twice per day. (Patient taking differently: 1 each by Other route See admin instructions. Use as instructed. Check blood glucose level by fingerstick twice per day.), Disp: 200 each, Rfl: 3 .  atorvastatin (LIPITOR) 40 MG tablet, Take 1 tablet (40 mg total) by mouth at bedtime., Disp: 90 tablet, Rfl: 2 .  methocarbamol (ROBAXIN) 500 MG tablet, Take 1 tablet (500 mg total) by mouth every 8 (eight) hours as needed for  muscle spasms. (Patient not taking: Reported on 04/22/2020), Disp: 15 tablet, Rfl: 1 .  omeprazole (PRILOSEC) 20 MG capsule, TAKE 1 CAPSULE (20 MG TOTAL) BY MOUTH 2 (TWO) TIMES DAILY BEFORE A MEAL. TAKE FIRST CAPSULE 30 MIN PRIOR TO EATING OR TAKING OTHER MEDICATIONS., Disp: 60 capsule, Rfl: 2 .  potassium chloride (KLOR-CON) 10 MEQ tablet, TAKE 60 MEQ (6 TABS) IN THE MORNING, 60 MEQ (6 TABS) IN THE AFTERNOON AND 40 MEQ (4 TABS) IN THE EVENING. TAKE AN EXTRA 4 TABS EVERY DOSE OF METOLAZONE, Disp: 540 tablet, Rfl: 3 No Known Allergies    Social History   Socioeconomic History  . Marital status: Single    Spouse name: Not on file  . Number of children: Not on file  . Years of education: Not on file  . Highest education level: Not on file  Occupational History  . Not on file  Tobacco Use  . Smoking status: Former Research scientist (life sciences)  . Smokeless tobacco: Never Used  Vaping Use  . Vaping Use: Never used  Substance and Sexual Activity  .  Alcohol use: Not Currently  . Drug use: Not Currently  . Sexual activity: Not Currently  Other Topics Concern  . Not on file  Social History Narrative  . Not on file   Social Determinants of Health   Financial Resource Strain: Medium Risk  . Difficulty of Paying Living Expenses: Somewhat hard  Food Insecurity: Food Insecurity Present  . Worried About Charity fundraiser in the Last Year: Sometimes true  . Ran Out of Food in the Last Year: Sometimes true  Transportation Needs: No Transportation Needs  . Lack of Transportation (Medical): No  . Lack of Transportation (Non-Medical): No  Physical Activity:   . Days of Exercise per Week: Not on file  . Minutes of Exercise per Session: Not on file  Stress:   . Feeling of Stress : Not on file  Social Connections:   . Frequency of Communication with Friends and Family: Not on file  . Frequency of Social Gatherings with Friends and Family: Not on file  . Attends Religious Services: Not on file  . Active Member of  Clubs or Organizations: Not on file  . Attends Archivist Meetings: Not on file  . Marital Status: Not on file  Intimate Partner Violence:   . Fear of Current or Ex-Partner: Not on file  . Emotionally Abused: Not on file  . Physically Abused: Not on file  . Sexually Abused: Not on file    Physical Exam      Future Appointments  Date Time Provider [place]  07/02/2020  1:30 PM [facility] PA/NP [facility] None  07/15/2020  9:30 AM [name], NP [facility] None    BP (!) 180/0   Pulse 85   Temp 97.7 F (36.5 C)   Resp 20   SpO2 96%   Weight yesterday-392 Last visit weight-395  Pt reports he has a lot of stress right now, financially. He owes more money from his past and they have contacted him so he is having to pay his rent money towards that amount to avoid further confrontation as he was just visited yesterday. He reports his landlord told him to go to [employer] for paperwork to fill out for rent assistance. So he was suppose to do that today.  Pt denies increased sob, no dizziness, no c/p, no bleeding issues. No edema noted.  No missed doses of his pills. We started with 2 weeks of pill bubble packs all except the potassium pills-due to them not fitting in the bubble pack.  This was explained to him how to use and he fully understood. Will f/u next week.   [name], [facility] Paramedic  05/11/20

## 2020-05-18 ENCOUNTER — Telehealth (HOSPITAL_COMMUNITY): Payer: Self-pay | Admitting: Licensed Clinical Social Worker

## 2020-05-18 NOTE — Telephone Encounter (Signed)
Pt called CSW this morning requesting help getting set up with mental health and or substance abuse counseling.   Pt reports that since the loss of his mother a few months ago he has been feeling very depressed and has been making mistakes (drinking etc).  Understands this is destructive behavior especially for him but having trouble moving forward on his own.  Pt parole officer had attempted to set him up with substance abuse counseling but they would not be able to see him until October- pt hopeful to get some support prior to that time.  CSW spoke with pt about going to [facility] to be assessed for mental health and substance abuse needs.  Pt is in agreement to going Wednesday morning as he is busy today and tomorrow.  CSW encouraged pt to reach out to CSW if he needed to talk to someone before then- pt expressed understanding  Will continue to follow and assist as needed  [name], LCSW Clinical Social Worker [facility] Desk#: [phone] Cell#: [phone]

## 2020-05-19 ENCOUNTER — Other Ambulatory Visit (HOSPITAL_COMMUNITY): Payer: Self-pay
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
## 2020-05-21 ENCOUNTER — Telehealth (HOSPITAL_COMMUNITY): Payer: Self-pay | Admitting: Licensed Clinical Social Worker

## 2020-05-21 NOTE — Telephone Encounter (Signed)
CSW made intake assessment appt with [facility] for Wednesday 9/8 at 10am for substance abuse IOP rehab- pt informed and agreeable to going. CSW scheduled ride through [facility] to get pt to his appt.  CSW will continue to follow and assist as needed  [name], LCSW Clinical Social Worker [facility] Desk#: [phone] Cell#: [phone]

## 2020-05-22 ENCOUNTER — Other Ambulatory Visit: Payer: Self-pay | Admitting: Nurse Practitioner

## 2020-05-22 MED ORDER — DOCUSATE SODIUM 100 MG PO CAPS: 100.0000 mg | ORAL_CAPSULE | Freq: Two times a day (BID) | ORAL | 2 refills | Status: DC

## 2020-05-22 MED ORDER — FERROUS SULFATE 325 (65 FE) MG PO TABS: 325.0000 mg | ORAL_TABLET | Freq: Every day | ORAL | 2 refills | Status: DC

## 2020-05-26 ENCOUNTER — Other Ambulatory Visit (HOSPITAL_COMMUNITY): Payer: Self-pay

## 2020-05-26 ENCOUNTER — Other Ambulatory Visit (HOSPITAL_COMMUNITY): Payer: Self-pay | Admitting: *Deleted

## 2020-05-26 ENCOUNTER — Other Ambulatory Visit: Payer: Self-pay | Admitting: Nurse Practitioner

## 2020-05-26 ENCOUNTER — Other Ambulatory Visit (HOSPITAL_COMMUNITY): Payer: Self-pay | Admitting: Cardiology

## 2020-05-26 DIAGNOSIS — I10 Essential (primary) hypertension: Secondary | ICD-10-CM

## 2020-05-26 DIAGNOSIS — E876 Hypokalemia: Secondary | ICD-10-CM

## 2020-05-26 DIAGNOSIS — IMO0002 Reserved for concepts with insufficient information to code with codable children: Secondary | ICD-10-CM

## 2020-05-26 MED ORDER — POTASSIUM CHLORIDE ER 10 MEQ PO TBCR: EXTENDED_RELEASE_TABLET | ORAL | 3 refills | Status: DC
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
## 2020-05-27 ENCOUNTER — Telehealth (HOSPITAL_COMMUNITY): Payer: Self-pay

## 2020-05-27 ENCOUNTER — Telehealth (HOSPITAL_COMMUNITY): Payer: Self-pay | Admitting: Licensed Clinical Social Worker

## 2020-05-27 NOTE — Telephone Encounter (Signed)
CSW received call from Paramedic stating that patient has not been picked up yet for his appointment at 10am. CSW contacted [facility] who states misunderstanding on dates and will get the transport scheduled for today. CSW returned call to [facility] paramedic as unable to reach patient to inform that transport is on the way. Patient instructed via paramedic to call [facility] when ready for return trip home. CSW available as needed. Lasandra Beech, LCSW, CCSW-MCS [phone]

## 2020-05-27 NOTE — Telephone Encounter (Signed)
Pt called me from the [facility] for his rehab appointment and he was told by the staff that he would have to pay $125 each visit and they want to see him 3X a week.  Pt obviously cannot afford that, pt is going to call [facility] to take him back home. Will address with jenna when she gets back to work next week.   Kerry Hough, EMT-Paramedic  05/27/20

## 2020-05-28 ENCOUNTER — Other Ambulatory Visit (HOSPITAL_COMMUNITY): Payer: Self-pay
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
## 2020-06-01 ENCOUNTER — Other Ambulatory Visit (HOSPITAL_COMMUNITY): Payer: Self-pay

## 2020-06-01 ENCOUNTER — Telehealth (HOSPITAL_COMMUNITY): Payer: Self-pay | Admitting: Licensed Clinical Social Worker

## 2020-06-01 NOTE — Progress Notes (Signed)
Paramedicine Encounter    Patient ID: Benjamin [name], male    DOB: Dec 23, 1965, 54 y.o.   MRN: [id number]   Patient Care Team: [name], NP as PCP - General (Nurse Practitioner) [name], MD as PCP - Cardiology (Cardiology) [name], LCSW as Social Worker (Licensed Clinical Social Worker)  Patient Active Problem List   Diagnosis Date Noted  . Acute on chronic diastolic heart failure ([place]) 02/24/2020  . Diabetic neuropathy ([place]) 11/20/2019  . Coagulation disorder ([place]) 11/20/2019  . Acute on chronic diastolic CHF (congestive heart failure), NYHA class 3 ([place]) 07/30/2019  . Diabetes mellitus without complication ([place]) 40/98/1191  . Hypertensive emergency   . BRBPR (bright red blood per rectum) 06/12/2019  . Hypertension   . CHF (congestive heart failure) ([place])   . Sleep apnea   . Chronic respiratory failure ([place])   . Hypokalemia 05/29/2019  . GI bleed 05/29/2019  . Anemia 05/28/2019  . Acute on chronic diastolic CHF (congestive heart failure) ([place]) 04/11/2019  . Iron deficiency anemia 03/11/2019  . OSA on CPAP 03/11/2019  . HLD (hyperlipidemia) 03/11/2019  . Elevated troponin 03/11/2019  . GERD (gastroesophageal reflux disease) 03/11/2019  . Rectal bleeding 02/20/2019  . Dyspnea 02/06/2019  . COVID-19 virus infection 02/06/2019  . Bilateral lower extremity edema   . Morbid obesity with BMI of 50.0-59.9, adult ([place])   . Chronic diastolic CHF (congestive heart failure) ([place])   . PAF (paroxysmal atrial fibrillation) ([place])   . PE (pulmonary thromboembolism) ([place]) 01/21/2019  . Hypertensive urgency 01/21/2019  . Diabetes mellitus type 2 in obese ([place]) 01/21/2019    Current Outpatient Medications:  .  amLODipine (NORVASC) 10 MG tablet, TAKE ONE TABLET BY MOUTH ONCE DAILY, Disp: 30 tablet, Rfl: 2 .  atorvastatin (LIPITOR) 40 MG tablet, Take 1 tablet (40 mg total) by mouth at bedtime., Disp: 90 tablet, Rfl: 2 .  Blood Glucose Monitoring Suppl (TRUE METRIX  METER) w/Device KIT, Use as instructed. Check blood glucose level by fingerstick twice per day. (Patient taking differently: 1 each by Other route See admin instructions. Use as instructed. Check blood glucose level by fingerstick twice per day.), Disp: 1 kit, Rfl: 0 .  Blood Pressure Monitor DEVI, Please provide patient with insurance approved blood pressure device with L-XL cuff. BMI 55 (Patient taking differently: 1 each by Other route See admin instructions. Please provide patient with insurance approved blood pressure device with L-XL cuff. BMI 55), Disp: 1 each, Rfl: 0 .  carboxymethylcellulose (REFRESH PLUS) 0.5 % SOLN, Place 1 drop into both eyes daily. , Disp: , Rfl:  .  docusate sodium (COLACE) 100 MG capsule, Take 1 capsule (100 mg total) by mouth 2 (two) times daily., Disp: 180 capsule, Rfl: 2 .  doxazosin (CARDURA) 1 MG tablet, Take 1 tablet (1 mg total) by mouth daily., Disp: 30 tablet, Rfl: 11 .  ferrous sulfate 325 (65 FE) MG tablet, Take 1 tablet (325 mg total) by mouth daily., Disp: 90 tablet, Rfl: 2 .  glucose blood (TRUE METRIX BLOOD GLUCOSE TEST) test strip, Use as instructed. Check blood glucose level by fingerstick twice per day. (Patient taking differently: 1 each by Other route See admin instructions. Use as instructed. Check blood glucose level by fingerstick twice per day.), Disp: 100 each, Rfl: 12 .  labetalol (NORMODYNE) 200 MG tablet, TAKE 2 TABLETS (400 MG TOTAL) BY MOUTH 2 (TWO) TIMES DAILY., Disp: 120 tablet, Rfl: 3 .  losartan (COZAAR) 100 MG tablet, Take 1  tablet (100 mg total) by mouth daily., Disp: 30 tablet, Rfl: 11 .  magnesium oxide (MAG-OX) 400 (241.3 Mg) MG tablet, TAKE 2 TABLETS BY MOUTH EVERY MORNING AND 1 TABLET EVERY EVENING (AM+NOON+BEDTIME), Disp: 90 tablet, Rfl: 3 .  metFORMIN (GLUCOPHAGE) 500 MG tablet, TAKE ONE TABLET BY MOUTH TWICE DAILY WITH A MEAL (NOON+BEDTIME), Disp: 180 tablet, Rfl: 0 .  methocarbamol (ROBAXIN) 500 MG tablet, Take 1 tablet (500 mg  total) by mouth every 8 (eight) hours as needed for muscle spasms. (Patient not taking: Reported on 04/22/2020), Disp: 15 tablet, Rfl: 1 .  metolazone (ZAROXOLYN) 5 MG tablet, Take 1 tablet (5 mg total) by mouth See admin instructions. Take 1 tablet every Monday, Wednesday, and Friday., Disp: 15 tablet, Rfl: 3 .  Misc. Devices MISC, Requires O2 @ 3L/min continuously via nasal canula and home fill system,. (Patient taking differently: 1 each by Other route See admin instructions. Requires O2 @ 3L/min continuously via nasal canula and home fill system,.), Disp: 1 each, Rfl: 0 .  Misc. Devices MISC, Please provide Mr. Hamme with insurance approved portable O2 concentrator ICD 10 J96.11 [phone] (Patient taking differently: 1 each by Other route See admin instructions. Please provide Mr. Clenney with insurance approved portable O2 concentrator ICD 10 J96.11 Z99.81), Disp: 1 each, Rfl: 0 .  Misc. Devices MISC, Please provide patient with XL sized insurance approved compression stockings. ICD 10 R60.0 and  I50.32, Disp: 1 each, Rfl: 0 .  omeprazole (PRILOSEC) 20 MG capsule, TAKE 1 CAPSULE (20 MG TOTAL) BY MOUTH 2 (TWO) TIMES DAILY BEFORE A MEAL. TAKE FIRST CAPSULE 30 MIN PRIOR TO EATING OR TAKING OTHER MEDICATIONS., Disp: 60 capsule, Rfl: 2 .  potassium chloride (KLOR-CON) 10 MEQ tablet, TAKE 60 MEQ (6 TABS) IN THE MORNING, 60 MEQ (6 TABS) IN THE AFTERNOON AND 40 MEQ (4 TABS) IN THE EVENING. TAKE AN EXTRA 4 TABS EVERY DOSE OF METOLAZONE, Disp: 540 tablet, Rfl: 3 .  PROVENTIL HFA 108 (90 Base) MCG/ACT inhaler, INHALE 2 PUFFS BY MOUTH EVERY 6 (SIX) HOURS AS NEEDED FOR WHEEZING OR SHORTNESS OF BREATH., Disp: 6.7 g, Rfl: 2 .  rivaroxaban (XARELTO) 20 MG TABS tablet, Take 1 tablet (20 mg total) by mouth daily with supper., Disp: 60 tablet, Rfl: 6 .  spironolactone (ALDACTONE) 50 MG tablet, TAKE 1 TABLET (50 MG TOTAL) BY MOUTH DAILY., Disp: 30 tablet, Rfl: 3 .  torsemide (DEMADEX) 100 MG tablet, Take 1 tablet (100 mg  total) by mouth 2 (two) times daily., Disp: 180 tablet, Rfl: 3 .  TRUEplus Lancets 28G MISC, Use as instructed. Check blood glucose level by fingerstick twice per day. (Patient taking differently: 1 each by Other route See admin instructions. Use as instructed. Check blood glucose level by fingerstick twice per day.), Disp: 200 each, Rfl: 3 No Known Allergies    Social History   Socioeconomic History  . Marital status: Single    Spouse name: Not on file  . Number of children: Not on file  . Years of education: Not on file  . Highest education level: Not on file  Occupational History  . Not on file  Tobacco Use  . Smoking status: Former Research scientist (life sciences)  . Smokeless tobacco: Never Used  Vaping Use  . Vaping Use: Never used  Substance and Sexual Activity  . Alcohol use: Not Currently  . Drug use: Not Currently  . Sexual activity: Not Currently  Other Topics Concern  . Not on file  Social History Narrative  .  Not on file   Social Determinants of Health   Financial Resource Strain: Medium Risk  . Difficulty of Paying Living Expenses: Somewhat hard  Food Insecurity: Food Insecurity Present  . Worried About Charity fundraiser in the Last Year: Sometimes true  . Ran Out of Food in the Last Year: Sometimes true  Transportation Needs: No Transportation Needs  . Lack of Transportation (Medical): No  . Lack of Transportation (Non-Medical): No  Physical Activity:   . Days of Exercise per Week: Not on file  . Minutes of Exercise per Session: Not on file  Stress:   . Feeling of Stress : Not on file  Social Connections:   . Frequency of Communication with Friends and Family: Not on file  . Frequency of Social Gatherings with Friends and Family: Not on file  . Attends Religious Services: Not on file  . Active Member of Clubs or Organizations: Not on file  . Attends Archivist Meetings: Not on file  . Marital Status: Not on file  Intimate Partner Violence:   . Fear of Current or  Ex-Partner: Not on file  . Emotionally Abused: Not on file  . Physically Abused: Not on file  . Sexually Abused: Not on file    Physical Exam      Future Appointments  Date Time Provider [place]  07/02/2020  1:30 PM [facility] PA/NP [facility] None  07/15/2020  9:30 AM [name], NP [facility] None    BP (!) 190/0   Pulse 86   Temp (!) 96.7 F (35.9 C)   Resp 20   Wt (!) 393 lb (178.3 kg)   SpO2 97%   BMI 56.39 kg/m   Weight yesterday-? Last visit weight-391  Pt reports doing ok, he is still very stressed at this time with his housing situations. The a/c is still broken. Still waiting on a part for it to be fixed.  He is still missing a few days of his potassium pills. I have rubber banded the bubble pack and his potassium packs to help him remember. But he still misses some.  He denies increased sob, no dizziness, no edema noted. But his b/p is still extremely high. He states he stays stressed. The roommates are dirty, has roaches in the fridge, the a/c not working. The shared bathroom stays dirty and he has to constantly clean it as he is the only one to clean it.  He has appoint to [facility] this morning. [name] got his txp for that. So at least that will keep out of the house for a period of time.  meds verified in bubble packs and filled potassium pack.   [name], [facility] Paramedic  06/01/20

## 2020-06-01 NOTE — Telephone Encounter (Signed)
CSW informed that pt does not have transportation to his first IOP substance abuse session this morning so CSW able to get him a cab.  Informed pt he needs to set up transport for future appts through his Medicaid transport- pt was able to call and set up for Wed and Friday this week- will plan to call later this week to set up transports for next week.  Pt reports first session went well and that he met people who were going through similar struggles as himself so he is hopeful this will be a positive experience.  Will continue to follow and assist as needed   H. , LCSW Clinical Social Worker [facility] Desk#: [phone] Cell#: [phone]   

## 2020-06-02 DIAGNOSIS — I5032 Chronic diastolic (congestive) heart failure: Secondary | ICD-10-CM | POA: Diagnosis not present

## 2020-06-02 DIAGNOSIS — J9611 Chronic respiratory failure with hypoxia: Secondary | ICD-10-CM | POA: Diagnosis not present

## 2020-06-08 ENCOUNTER — Other Ambulatory Visit (HOSPITAL_COMMUNITY): Payer: Self-pay

## 2020-06-08 NOTE — Progress Notes (Signed)
Paramedicine Encounter    Patient ID: Benjamin [name], male    DOB: March 26, 1966, 54 y.o.   MRN: [id number]   Patient Care Team: [name], NP as PCP - General (Nurse Practitioner) [name], MD as PCP - Cardiology (Cardiology) [name], LCSW as Social Worker (Licensed Clinical Social Worker)  Patient Active Problem List   Diagnosis Date Noted  . Acute on chronic diastolic heart failure ([place]) 02/24/2020  . Diabetic neuropathy ([place]) 11/20/2019  . Coagulation disorder ([place]) 11/20/2019  . Acute on chronic diastolic CHF (congestive heart failure), NYHA class 3 ([place]) 07/30/2019  . Diabetes mellitus without complication ([place]) 76/81/1572  . Hypertensive emergency   . BRBPR (bright red blood per rectum) 06/12/2019  . Hypertension   . CHF (congestive heart failure) ([place])   . Sleep apnea   . Chronic respiratory failure ([place])   . Hypokalemia 05/29/2019  . GI bleed 05/29/2019  . Anemia 05/28/2019  . Acute on chronic diastolic CHF (congestive heart failure) ([place]) 04/11/2019  . Iron deficiency anemia 03/11/2019  . OSA on CPAP 03/11/2019  . HLD (hyperlipidemia) 03/11/2019  . Elevated troponin 03/11/2019  . GERD (gastroesophageal reflux disease) 03/11/2019  . Rectal bleeding 02/20/2019  . Dyspnea 02/06/2019  . COVID-19 virus infection 02/06/2019  . Bilateral lower extremity edema   . Morbid obesity with BMI of 50.0-59.9, adult ([place])   . Chronic diastolic CHF (congestive heart failure) ()   . PAF (paroxysmal atrial fibrillation) ([place])   . PE (pulmonary thromboembolism) ([place]) 01/21/2019  . Hypertensive urgency 01/21/2019  . Diabetes mellitus type 2 in obese ([place]) 01/21/2019    Current Outpatient Medications:  .  amLODipine (NORVASC) 10 MG tablet, TAKE ONE TABLET BY MOUTH ONCE DAILY, Disp: 30 tablet, Rfl: 2 .  atorvastatin (LIPITOR) 40 MG tablet, Take 1 tablet (40 mg total) by mouth at bedtime., Disp: 90 tablet, Rfl: 2 .  Blood Glucose Monitoring Suppl (TRUE METRIX  METER) w/Device KIT, Use as instructed. Check blood glucose level by fingerstick twice per day. (Patient taking differently: 1 each by Other route See admin instructions. Use as instructed. Check blood glucose level by fingerstick twice per day.), Disp: 1 kit, Rfl: 0 .  Blood Pressure Monitor DEVI, Please provide patient with insurance approved blood pressure device with L-XL cuff. BMI 55 (Patient taking differently: 1 each by Other route See admin instructions. Please provide patient with insurance approved blood pressure device with L-XL cuff. BMI 55), Disp: 1 each, Rfl: 0 .  carboxymethylcellulose (REFRESH PLUS) 0.5 % SOLN, Place 1 drop into both eyes daily. , Disp: , Rfl:  .  docusate sodium (COLACE) 100 MG capsule, Take 1 capsule (100 mg total) by mouth 2 (two) times daily., Disp: 180 capsule, Rfl: 2 .  doxazosin (CARDURA) 1 MG tablet, Take 1 tablet (1 mg total) by mouth daily., Disp: 30 tablet, Rfl: 11 .  ferrous sulfate 325 (65 FE) MG tablet, Take 1 tablet (325 mg total) by mouth daily., Disp: 90 tablet, Rfl: 2 .  glucose blood (TRUE METRIX BLOOD GLUCOSE TEST) test strip, Use as instructed. Check blood glucose level by fingerstick twice per day. (Patient taking differently: 1 each by Other route See admin instructions. Use as instructed. Check blood glucose level by fingerstick twice per day.), Disp: 100 each, Rfl: 12 .  labetalol (NORMODYNE) 200 MG tablet, TAKE 2 TABLETS (400 MG TOTAL) BY MOUTH 2 (TWO) TIMES DAILY., Disp: 120 tablet, Rfl: 3 .  losartan (COZAAR) 100 MG tablet, Take 1  tablet (100 mg total) by mouth daily., Disp: 30 tablet, Rfl: 11 .  magnesium oxide (MAG-OX) 400 (241.3 Mg) MG tablet, TAKE 2 TABLETS BY MOUTH EVERY MORNING AND 1 TABLET EVERY EVENING (AM+NOON+BEDTIME), Disp: 90 tablet, Rfl: 3 .  metFORMIN (GLUCOPHAGE) 500 MG tablet, TAKE ONE TABLET BY MOUTH TWICE DAILY WITH A MEAL (NOON+BEDTIME), Disp: 180 tablet, Rfl: 0 .  methocarbamol (ROBAXIN) 500 MG tablet, Take 1 tablet (500 mg  total) by mouth every 8 (eight) hours as needed for muscle spasms. (Patient not taking: Reported on 04/22/2020), Disp: 15 tablet, Rfl: 1 .  metolazone (ZAROXOLYN) 5 MG tablet, Take 1 tablet (5 mg total) by mouth See admin instructions. Take 1 tablet every Monday, Wednesday, and Friday., Disp: 15 tablet, Rfl: 3 .  Misc. Devices MISC, Requires O2 @ 3L/min continuously via nasal canula and home fill system,. (Patient taking differently: 1 each by Other route See admin instructions. Requires O2 @ 3L/min continuously via nasal canula and home fill system,.), Disp: 1 each, Rfl: 0 .  Misc. Devices MISC, Please provide Mr. Sydnor with insurance approved portable O2 concentrator ICD 10 J96.11 [phone] (Patient taking differently: 1 each by Other route See admin instructions. Please provide Mr. Reha with insurance approved portable O2 concentrator ICD 10 J96.11 Z99.81), Disp: 1 each, Rfl: 0 .  Misc. Devices MISC, Please provide patient with XL sized insurance approved compression stockings. ICD 10 R60.0 and  I50.32, Disp: 1 each, Rfl: 0 .  omeprazole (PRILOSEC) 20 MG capsule, TAKE 1 CAPSULE (20 MG TOTAL) BY MOUTH 2 (TWO) TIMES DAILY BEFORE A MEAL. TAKE FIRST CAPSULE 30 MIN PRIOR TO EATING OR TAKING OTHER MEDICATIONS., Disp: 60 capsule, Rfl: 2 .  potassium chloride (KLOR-CON) 10 MEQ tablet, TAKE 60 MEQ (6 TABS) IN THE MORNING, 60 MEQ (6 TABS) IN THE AFTERNOON AND 40 MEQ (4 TABS) IN THE EVENING. TAKE AN EXTRA 4 TABS EVERY DOSE OF METOLAZONE, Disp: 540 tablet, Rfl: 3 .  PROVENTIL HFA 108 (90 Base) MCG/ACT inhaler, INHALE 2 PUFFS BY MOUTH EVERY 6 (SIX) HOURS AS NEEDED FOR WHEEZING OR SHORTNESS OF BREATH., Disp: 6.7 g, Rfl: 2 .  rivaroxaban (XARELTO) 20 MG TABS tablet, Take 1 tablet (20 mg total) by mouth daily with supper., Disp: 60 tablet, Rfl: 6 .  spironolactone (ALDACTONE) 50 MG tablet, TAKE 1 TABLET (50 MG TOTAL) BY MOUTH DAILY., Disp: 30 tablet, Rfl: 3 .  torsemide (DEMADEX) 100 MG tablet, Take 1 tablet (100 mg  total) by mouth 2 (two) times daily., Disp: 180 tablet, Rfl: 3 .  TRUEplus Lancets 28G MISC, Use as instructed. Check blood glucose level by fingerstick twice per day. (Patient taking differently: 1 each by Other route See admin instructions. Use as instructed. Check blood glucose level by fingerstick twice per day.), Disp: 200 each, Rfl: 3 No Known Allergies    Social History   Socioeconomic History  . Marital status: Single    Spouse name: Not on file  . Number of children: Not on file  . Years of education: Not on file  . Highest education level: Not on file  Occupational History  . Not on file  Tobacco Use  . Smoking status: Former Research scientist (life sciences)  . Smokeless tobacco: Never Used  Vaping Use  . Vaping Use: Never used  Substance and Sexual Activity  . Alcohol use: Not Currently  . Drug use: Not Currently  . Sexual activity: Not Currently  Other Topics Concern  . Not on file  Social History Narrative  .  Not on file   Social Determinants of Health   Financial Resource Strain: Medium Risk  . Difficulty of Paying Living Expenses: Somewhat hard  Food Insecurity: Food Insecurity Present  . Worried About Charity fundraiser in the Last Year: Sometimes true  . Ran Out of Food in the Last Year: Sometimes true  Transportation Needs: No Transportation Needs  . Lack of Transportation (Medical): No  . Lack of Transportation (Non-Medical): No  Physical Activity:   . Days of Exercise per Week: Not on file  . Minutes of Exercise per Session: Not on file  Stress:   . Feeling of Stress : Not on file  Social Connections:   . Frequency of Communication with Friends and Family: Not on file  . Frequency of Social Gatherings with Friends and Family: Not on file  . Attends Religious Services: Not on file  . Active Member of Clubs or Organizations: Not on file  . Attends Archivist Meetings: Not on file  . Marital Status: Not on file  Intimate Partner Violence:   . Fear of Current or  Ex-Partner: Not on file  . Emotionally Abused: Not on file  . Physically Abused: Not on file  . Sexually Abused: Not on file    Physical Exam      Future Appointments  Date Time Provider [place]  06/12/2020  3:30 PM Lauraine Rinne, NP [facility] None  07/02/2020  1:30 PM [facility] PA/NP [facility] None  07/15/2020  9:30 AM [name], NP [facility] None    BP (!) 182/0   Pulse 84   Temp (!) 97.2 F (36.2 C)   Resp 20   SpO2 96%   Weight yesterday-393 Last visit weight-393  Pt sitting outside when I arrived. Pt states he is waiting on his ride as well as the a/c is still not working.  Pt got letter from CPAP company that his unit is being recalled and it will need to be replaced and was told to stop using it, so that was late last week when he got the letter. He denies increased sob, he states he has been getting "thuds" in his chest and he feels like his heart is beating irregular. Did a 12lead and he is showing to have some extra beats in there. I showed 12 lead to Amy and she reports it is due from his lack of CPAP use. I took [name] the paperwork that was sent to him and she will f/u on what to do next.  meds verified. No missed doses of his meds this week.  Potassium pill box refilled.   [name], [facility] Paramedic  06/08/20

## 2020-06-11 DIAGNOSIS — I5032 Chronic diastolic (congestive) heart failure: Secondary | ICD-10-CM | POA: Diagnosis not present

## 2020-06-11 DIAGNOSIS — J9611 Chronic respiratory failure with hypoxia: Secondary | ICD-10-CM | POA: Diagnosis not present

## 2020-06-12 ENCOUNTER — Other Ambulatory Visit: Payer: Self-pay

## 2020-06-12 ENCOUNTER — Ambulatory Visit (INDEPENDENT_AMBULATORY_CARE_PROVIDER_SITE_OTHER): Payer: Medicaid Other | Admitting: Pulmonary Disease

## 2020-06-12 ENCOUNTER — Encounter: Payer: Self-pay | Admitting: Pulmonary Disease

## 2020-06-12 VITALS — BP 140/82 | HR 88 | Temp 97.2°F | Ht 70.0 in | Wt 369.8 lb

## 2020-06-12 DIAGNOSIS — E662 Morbid (severe) obesity with alveolar hypoventilation: Secondary | ICD-10-CM | POA: Diagnosis not present

## 2020-06-12 DIAGNOSIS — G4733 Obstructive sleep apnea (adult) (pediatric): Secondary | ICD-10-CM | POA: Diagnosis not present

## 2020-06-12 DIAGNOSIS — Z0000 Encounter for general adult medical examination without abnormal findings: Secondary | ICD-10-CM | POA: Insufficient documentation

## 2020-06-12 DIAGNOSIS — J9611 Chronic respiratory failure with hypoxia: Secondary | ICD-10-CM

## 2020-06-12 DIAGNOSIS — Z9989 Dependence on other enabling machines and devices: Secondary | ICD-10-CM | POA: Diagnosis not present

## 2020-06-12 DIAGNOSIS — Z23 Encounter for immunization: Secondary | ICD-10-CM

## 2020-06-12 NOTE — Patient Instructions (Addendum)
You were seen today by Coral Ceo, NP  for:   1. OSA on CPAP 2. Obesity hypoventilation syndrome (HCC) 3. Chronic respiratory failure with hypoxia (HCC)  New CPAP start DME: [employer] CPAP set pressure of 17 Continue 2 to 3 L of O2 24/7, including with CPAP at night Supplies Mask of choice  Patient will need to have 8-week follow-up to show compliance  We recommend that you continue using your CPAP daily >>>Keep up the hard work using your device >>> Goal should be wearing this for the entire night that you are sleeping, at least 4 to 6 hours  Remember:  . Do not drive or operate heavy machinery if tired or drowsy.  . Please notify the supply company and office if you are unable to use your device regularly due to missing supplies or machine being broken.  . Work on maintaining a healthy weight and following your recommended nutrition plan  . Maintain proper daily exercise and movement  . Maintaining proper use of your device can also help improve management of other chronic illnesses such as: Blood pressure, blood sugars, and weight management.   BiPAP/ CPAP Cleaning:  >>>Clean weekly, with Dawn soap, and bottle brush.  Set up to air dry. >>> Wipe mask out daily with wet wipe or towelette    4. Healthcare maintenance  Regular flu vaccine today   Follow Up:    Return in about 2 months (around 08/12/2020), or if symptoms worsen or fail to improve, for Follow up with Dr. Craige Cotta, Follow up with Elisha Headland FNP-C.   Notification of test results are managed in the following manner: If there are  any recommendations or changes to the  plan of care discussed in office today,  we will contact you and let you know what they are. If you do not hear from [place], then your results are normal and you can view them through your  MyChart account , or a letter will be sent to you. Thank you again for trusting [place] with your care  - Thank you, [facility]    It is flu season:   >>> Best  ways to protect herself from the flu: Receive the yearly flu vaccine, practice good hand hygiene washing with soap and also using hand sanitizer when available, eat a nutritious meals, get adequate rest, hydrate appropriately       Please contact the office if your symptoms worsen or you have concerns that you are not improving.   Thank you for choosing [facility] for your healthcare, and for allowing [place] to partner with you on your healthcare journey. I am thankful to be able to provide care to you today.   Elisha Headland FNP-C

## 2020-06-12 NOTE — Assessment & Plan Note (Signed)
Plan: We will order new CPAP start today Same CPAP set pressure of 17 Supplies, mask of choice Patient will have to follow-up in 2 months

## 2020-06-12 NOTE — Progress Notes (Signed)
Reviewed and agree with assessment/plan.   Coralyn Helling, MD [facility] 06/12/2020, 5:11 PM Pager:  [phone]

## 2020-06-12 NOTE — Assessment & Plan Note (Signed)
Current CPAP is involved in the recall Patient having episodes of lint in his mouth when using CPAP Patient's Medicaid/current insurance has never been billed for a CPAP Patient with severe obstructive sleep apnea  Plan: We will order new CPAP start today Same CPAP set pressure of 17 Supplies, mask of choice Patient will have to follow-up in 2 months

## 2020-06-12 NOTE — Progress Notes (Signed)
_0  ID: Benjamin [name], male    DOB: 22-Feb-1966, 54 y.o.   MRN: [id number]  Chief Complaint  Patient presents with  . Follow-up    OSA    Referring provider: [name], NP  HPI:  53 year old male former smoker followed in our office for severe obstructive sleep apnea  PMH: History of PE, type 2 diabetes, history of COVID-19, GERD, hypertension, CHF Smoker/ Smoking History: Former smoker Maintenance: None Pt of: Dr. Halford Chessman  06/12/2020  - Visit   54 year old male former smoker followed in our office for severe obstructive sleep apnea.  He is established with Dr. Halford Chessman.  He was last seen in November/2020.  He is presenting to office today as his CPAP which is currently being managed by [employer] and was purchased by the hospital is involved with the recall from [facility].  He was informed not to use his CPAP.  He is also having episodes of where when he was using his CPAP that he feels that he hears "foam or lint in his mouth".  His social worker ([phone]) suggested to have follow-up with pulmonary care to see if patient is able to be switched to a different CPAP or if it is okay for him to not receive CPAP therapy.  We will discuss and evaluate this today.  Patient is been working diligently on losing weight.  He has lost almost 100 pounds.  Patient has received the COVID-19 vaccinations and is hoping to receive his flu vaccine today.  Tests:   Chest imaging:  CT angio chest 01/21/19 >> b/l lower lobe PE CT angio chest 02/26/19 >> no PE  Sleep tests:  PSG 05/17/19 >> AHI 128.1, SpO2 low 51%.  CPAP 17 cm H2O with 2 liters oxygen.  Cardiac tests:  Echo 01/22/19 >> EF 60 to 65%, mild LVH RHC 04/17/19 >> pulmonary venous hypertension   FENO:  No results found for: NITRICOXIDE  PFT: No flowsheet data found.  WALK:  No flowsheet data found.  Imaging: No results found.  Lab Results:  CBC    Component Value Date/Time   WBC 10.2 02/24/2020 1501   RBC 5.70  02/24/2020 1501   HGB 13.5 02/24/2020 1501   HCT 46.0 02/24/2020 1501   PLT 360 02/24/2020 1501   MCV 80.7 02/24/2020 1501   MCH 23.7 (L) 02/24/2020 1501   MCHC 29.3 (L) 02/24/2020 1501   RDW 16.7 (H) 02/24/2020 1501   LYMPHSABS 1.6 02/24/2020 1501   MONOABS 0.8 02/24/2020 1501   EOSABS 0.3 02/24/2020 1501   BASOSABS 0.0 02/24/2020 1501    BMET    Component Value Date/Time   NA 137 03/02/2020 1247   K 3.8 03/02/2020 1247   CL 101 03/02/2020 1247   CO2 28 03/02/2020 1247   GLUCOSE 89 03/02/2020 1247   BUN 9 03/02/2020 1247   CREATININE 1.09 03/02/2020 1247   CALCIUM 8.8 (L) 03/02/2020 1247   GFRNONAA >60 03/02/2020 1247   GFRAA >60 03/02/2020 1247    BNP    Component Value Date/Time   BNP 75.1 02/24/2020 1501    ProBNP No results found for: PROBNP  Specialty Problems      Pulmonary Problems   Dyspnea   OSA on CPAP   Chronic respiratory failure (HCC)   Sleep apnea   Obesity hypoventilation syndrome (HCC)      No Known Allergies  Immunization History  Administered Date(s) Administered  . Influenza,inj,Quad PF,6+ Mos 06/04/2019, 06/12/2020  . PFIZER SARS-COV-2 Vaccination 01/02/2020, 01/29/2020  .  Pneumococcal Polysaccharide-23 04/30/2019  . Tdap 04/30/2019    Past Medical History:  Diagnosis Date  . A-fib ([place])   . Anemia 05/29/2019  . CHF (congestive heart failure) ([place])   . Chronic respiratory failure ([place])   . Diabetes mellitus without complication ([place])   . Dyspnea   . Elevated troponin 02/06/2019  . Hypertension   . Obesity   . Pulmonary embolism ([place])   . Rectal bleeding 05/29/2019  . Sleep apnea     Tobacco History: Social History   Tobacco Use  Smoking Status Former Smoker  Smokeless Tobacco Never Used   Counseling given: Not Answered   Continue to not smoke  Outpatient Encounter Medications as of 06/12/2020  Medication Sig  . amLODipine (NORVASC) 10 MG tablet TAKE ONE TABLET BY MOUTH ONCE DAILY  . Blood Glucose Monitoring  Suppl (TRUE METRIX METER) w/Device KIT Use as instructed. Check blood glucose level by fingerstick twice per day. (Patient taking differently: 1 each by Other route See admin instructions. Use as instructed. Check blood glucose level by fingerstick twice per day.)  . Blood Pressure Monitor DEVI Please provide patient with insurance approved blood pressure device with L-XL cuff. BMI 55 (Patient taking differently: 1 each by Other route See admin instructions. Please provide patient with insurance approved blood pressure device with L-XL cuff. BMI 55)  . carboxymethylcellulose (REFRESH PLUS) 0.5 % SOLN Place 1 drop into both eyes daily.   Marland Kitchen docusate sodium (COLACE) 100 MG capsule Take 1 capsule (100 mg total) by mouth 2 (two) times daily.  Marland Kitchen doxazosin (CARDURA) 1 MG tablet Take 1 tablet (1 mg total) by mouth daily.  . ferrous sulfate 325 (65 FE) MG tablet Take 1 tablet (325 mg total) by mouth daily.  Marland Kitchen glucose blood (TRUE METRIX BLOOD GLUCOSE TEST) test strip Use as instructed. Check blood glucose level by fingerstick twice per day. (Patient taking differently: 1 each by Other route See admin instructions. Use as instructed. Check blood glucose level by fingerstick twice per day.)  . labetalol (NORMODYNE) 200 MG tablet TAKE 2 TABLETS (400 MG TOTAL) BY MOUTH 2 (TWO) TIMES DAILY.  Marland Kitchen losartan (COZAAR) 100 MG tablet Take 1 tablet (100 mg total) by mouth daily.  . magnesium oxide (MAG-OX) 400 (241.3 Mg) MG tablet TAKE 2 TABLETS BY MOUTH EVERY MORNING AND 1 TABLET EVERY EVENING (AM+NOON+BEDTIME)  . metFORMIN (GLUCOPHAGE) 500 MG tablet TAKE ONE TABLET BY MOUTH TWICE DAILY WITH A MEAL (NOON+BEDTIME)  . methocarbamol (ROBAXIN) 500 MG tablet Take 1 tablet (500 mg total) by mouth every 8 (eight) hours as needed for muscle spasms.  . metolazone (ZAROXOLYN) 5 MG tablet Take 1 tablet (5 mg total) by mouth See admin instructions. Take 1 tablet every Monday, Wednesday, and Friday.  . Misc. Devices MISC Requires O2 @  3L/min continuously via nasal canula and home fill system,. (Patient taking differently: 1 each by Other route See admin instructions. Requires O2 @ 3L/min continuously via nasal canula and home fill system,.)  . Misc. Devices MISC Please provide Mr. Vanover with insurance approved portable O2 concentrator ICD 10 J96.11 [phone] (Patient taking differently: 1 each by Other route See admin instructions. Please provide Mr. Deerman with insurance approved portable O2 concentrator ICD 10 J96.11 Z99.81)  . Misc. Devices MISC Please provide patient with XL sized insurance approved compression stockings. ICD 10 R60.0 and  I50.32  . potassium chloride (KLOR-CON) 10 MEQ tablet TAKE 60 MEQ (6 TABS) IN THE MORNING, 60 MEQ (6 TABS) IN THE  AFTERNOON AND 40 MEQ (4 TABS) IN THE EVENING. TAKE AN EXTRA 4 TABS EVERY DOSE OF METOLAZONE  . PROVENTIL HFA 108 (90 Base) MCG/ACT inhaler INHALE 2 PUFFS BY MOUTH EVERY 6 (SIX) HOURS AS NEEDED FOR WHEEZING OR SHORTNESS OF BREATH.  . rivaroxaban (XARELTO) 20 MG TABS tablet Take 1 tablet (20 mg total) by mouth daily with supper.  Marland Kitchen spironolactone (ALDACTONE) 50 MG tablet TAKE 1 TABLET (50 MG TOTAL) BY MOUTH DAILY.  Marland Kitchen torsemide (DEMADEX) 100 MG tablet Take 1 tablet (100 mg total) by mouth 2 (two) times daily.  . TRUEplus Lancets 28G MISC Use as instructed. Check blood glucose level by fingerstick twice per day. (Patient taking differently: 1 each by Other route See admin instructions. Use as instructed. Check blood glucose level by fingerstick twice per day.)  . atorvastatin (LIPITOR) 40 MG tablet Take 1 tablet (40 mg total) by mouth at bedtime.  Marland Kitchen omeprazole (PRILOSEC) 20 MG capsule TAKE 1 CAPSULE (20 MG TOTAL) BY MOUTH 2 (TWO) TIMES DAILY BEFORE A MEAL. TAKE FIRST CAPSULE 30 MIN PRIOR TO EATING OR TAKING OTHER MEDICATIONS.   No facility-administered encounter medications on file as of 06/12/2020.     Review of Systems  Review of Systems  Constitutional: Positive for fatigue.  Negative for activity change, chills, fever and unexpected weight change.  HENT: Negative for postnasal drip, rhinorrhea, sinus pressure, sinus pain and sore throat.   Eyes: Negative.   Respiratory: Positive for shortness of breath. Negative for cough and wheezing.   Cardiovascular: Negative for chest pain and palpitations.  Gastrointestinal: Negative for constipation, diarrhea, nausea and vomiting.  Endocrine: Negative.   Genitourinary: Negative.   Musculoskeletal: Negative.   Skin: Negative.   Neurological: Negative for dizziness and headaches.  Psychiatric/Behavioral: Negative.  Negative for dysphoric mood. The patient is not nervous/anxious.   All other systems reviewed and are negative.    Physical Exam  BP 140/82 (BP Location: Left Arm, Cuff Size: Normal)   Pulse 88   Temp (!) 97.2 F (36.2 C) (Oral)   Ht _0  (1.778 m)   Wt (!) 369 lb 12.8 oz (167.7 kg)   SpO2 98%   BMI 53.06 kg/m   Wt Readings from Last 5 Encounters:  06/12/20 (!) 369 lb 12.8 oz (167.7 kg)  06/01/20 (!) 393 lb (178.3 kg)  05/26/20 (!) 391 lb (177.4 kg)  05/19/20 (!) 392 lb (177.8 kg)  05/04/20 (!) 395 lb (179.2 kg)    BMI Readings from Last 5 Encounters:  06/12/20 53.06 kg/m  06/01/20 56.39 kg/m  05/26/20 56.10 kg/m  05/19/20 56.25 kg/m  05/04/20 56.68 kg/m     Physical Exam Vitals and nursing note reviewed.  Constitutional:      General: He is not in acute distress.    Appearance: Normal appearance. He is obese.  HENT:     Head: Normocephalic and atraumatic.     Right Ear: Hearing, tympanic membrane, ear canal and external ear normal. There is no impacted cerumen.     Left Ear: Hearing, tympanic membrane, ear canal and external ear normal. There is no impacted cerumen.     Nose: Nose normal. No mucosal edema or rhinorrhea.     Right Turbinates: Not enlarged.     Left Turbinates: Not enlarged.     Mouth/Throat:     Mouth: Mucous membranes are dry.     Pharynx: Oropharynx is  clear. No oropharyngeal exudate.  Eyes:     Pupils: Pupils are equal, round, and  reactive to light.  Cardiovascular:     Rate and Rhythm: Normal rate and regular rhythm.     Pulses: Normal pulses.     Heart sounds: Normal heart sounds. No murmur heard.   Pulmonary:     Effort: Pulmonary effort is normal.     Breath sounds: No decreased breath sounds, wheezing or rales.  Musculoskeletal:     Cervical back: Normal range of motion.     Right lower leg: No edema.     Left lower leg: No edema.  Lymphadenopathy:     Cervical: No cervical adenopathy.  Skin:    General: Skin is warm and dry.     Capillary Refill: Capillary refill takes less than 2 seconds.     Findings: No erythema or rash.  Neurological:     General: No focal deficit present.     Mental Status: He is alert and oriented to person, place, and time.     Motor: No weakness.     Coordination: Coordination normal.     Gait: Gait is intact. Gait normal.  Psychiatric:        Mood and Affect: Mood normal.        Behavior: Behavior normal. Behavior is cooperative.        Thought Content: Thought content normal.        Judgment: Judgment normal.       Assessment & Plan:   OSA on CPAP Current CPAP is involved in the recall Patient having episodes of lint in his mouth when using CPAP Patient's Medicaid/current insurance has never been billed for a CPAP Patient with severe obstructive sleep apnea  Plan: We will order new CPAP start today Same CPAP set pressure of 17 Supplies, mask of choice Patient will have to follow-up in 2 months  Obesity hypoventilation syndrome ([place]) Plan: We will order new CPAP start today Same CPAP set pressure of 17 Supplies, mask of choice Patient will have to follow-up in 2 months  Chronic respiratory failure ([place]) Plan: Continue oxygen therapy as prescribed-2 to 3 L 24/7  Healthcare maintenance Flu vaccine today    Return in about 2 months (around 08/12/2020), or if symptoms  worsen or fail to improve, for Follow up with Dr. Halford Chessman, Follow up with Wyn Quaker FNP-C.   Lauraine Rinne, NP 06/12/2020   This appointment required 32 minutes of patient care (this includes precharting, chart review, review of results, face-to-face care, etc.).

## 2020-06-12 NOTE — Assessment & Plan Note (Signed)
Plan: Continue oxygen therapy as prescribed-2 to 3 L 24/7

## 2020-06-12 NOTE — Assessment & Plan Note (Signed)
Flu vaccine today 

## 2020-06-22 ENCOUNTER — Other Ambulatory Visit (HOSPITAL_COMMUNITY): Payer: Self-pay

## 2020-06-22 NOTE — Progress Notes (Signed)
Paramedicine Encounter    Patient ID: Benjamin [name], male    DOB: 02-12-1966, 53 y.o.   MRN: [id number]   Patient Care Team: [name], NP as PCP - General (Nurse Practitioner) [name], MD as PCP - Cardiology (Cardiology) [name], LCSW as Social Worker (Licensed Clinical Social Worker)  Patient Active Problem List   Diagnosis Date Noted  . Obesity hypoventilation syndrome ([place]) 06/12/2020  . Healthcare maintenance 06/12/2020  . Acute on chronic diastolic heart failure ([place]) 02/24/2020  . Diabetic neuropathy ([place]) 11/20/2019  . Coagulation disorder ([place]) 11/20/2019  . Acute on chronic diastolic CHF (congestive heart failure), NYHA class 3 ([place]) 07/30/2019  . Diabetes mellitus without complication ([place]) 76/28/3151  . Hypertensive emergency   . BRBPR (bright red blood per rectum) 06/12/2019  . Hypertension   . CHF (congestive heart failure) ([place])   . Sleep apnea   . Chronic respiratory failure ([place])   . Hypokalemia 05/29/2019  . GI bleed 05/29/2019  . Anemia 05/28/2019  . Acute on chronic diastolic CHF (congestive heart failure) ([place]) 04/11/2019  . Iron deficiency anemia 03/11/2019  . OSA on CPAP 03/11/2019  . HLD (hyperlipidemia) 03/11/2019  . Elevated troponin 03/11/2019  . GERD (gastroesophageal reflux disease) 03/11/2019  . Rectal bleeding 02/20/2019  . Dyspnea 02/06/2019  . COVID-19 virus infection 02/06/2019  . Bilateral lower extremity edema   . Morbid obesity with BMI of 50.0-59.9, adult ([place])   . Chronic diastolic CHF (congestive heart failure) ([place])   . PAF (paroxysmal atrial fibrillation) ([place])   . PE (pulmonary thromboembolism) ([place]) 01/21/2019  . Hypertensive urgency 01/21/2019  . Diabetes mellitus type 2 in obese ([place]) 01/21/2019    Current Outpatient Medications:  .  amLODipine (NORVASC) 10 MG tablet, TAKE ONE TABLET BY MOUTH ONCE DAILY, Disp: 30 tablet, Rfl: 2 .  Blood Glucose Monitoring Suppl (TRUE METRIX METER) w/Device KIT, Use as  instructed. Check blood glucose level by fingerstick twice per day. (Patient taking differently: 1 each by Other route See admin instructions. Use as instructed. Check blood glucose level by fingerstick twice per day.), Disp: 1 kit, Rfl: 0 .  Blood Pressure Monitor DEVI, Please provide patient with insurance approved blood pressure device with L-XL cuff. BMI 55 (Patient taking differently: 1 each by Other route See admin instructions. Please provide patient with insurance approved blood pressure device with L-XL cuff. BMI 55), Disp: 1 each, Rfl: 0 .  carboxymethylcellulose (REFRESH PLUS) 0.5 % SOLN, Place 1 drop into both eyes daily. , Disp: , Rfl:  .  docusate sodium (COLACE) 100 MG capsule, Take 1 capsule (100 mg total) by mouth 2 (two) times daily., Disp: 180 capsule, Rfl: 2 .  doxazosin (CARDURA) 1 MG tablet, Take 1 tablet (1 mg total) by mouth daily., Disp: 30 tablet, Rfl: 11 .  ferrous sulfate 325 (65 FE) MG tablet, Take 1 tablet (325 mg total) by mouth daily., Disp: 90 tablet, Rfl: 2 .  glucose blood (TRUE METRIX BLOOD GLUCOSE TEST) test strip, Use as instructed. Check blood glucose level by fingerstick twice per day. (Patient taking differently: 1 each by Other route See admin instructions. Use as instructed. Check blood glucose level by fingerstick twice per day.), Disp: 100 each, Rfl: 12 .  labetalol (NORMODYNE) 200 MG tablet, TAKE 2 TABLETS (400 MG TOTAL) BY MOUTH 2 (TWO) TIMES DAILY., Disp: 120 tablet, Rfl: 3 .  losartan (COZAAR) 100 MG tablet, Take 1 tablet (100 mg total) by mouth daily., Disp: 30 tablet,  Rfl: 11 .  magnesium oxide (MAG-OX) 400 (241.3 Mg) MG tablet, TAKE 2 TABLETS BY MOUTH EVERY MORNING AND 1 TABLET EVERY EVENING (AM+NOON+BEDTIME), Disp: 90 tablet, Rfl: 3 .  metFORMIN (GLUCOPHAGE) 500 MG tablet, TAKE ONE TABLET BY MOUTH TWICE DAILY WITH A MEAL (NOON+BEDTIME), Disp: 180 tablet, Rfl: 0 .  methocarbamol (ROBAXIN) 500 MG tablet, Take 1 tablet (500 mg total) by mouth every 8  (eight) hours as needed for muscle spasms., Disp: 15 tablet, Rfl: 1 .  metolazone (ZAROXOLYN) 5 MG tablet, Take 1 tablet (5 mg total) by mouth See admin instructions. Take 1 tablet every Monday, Wednesday, and Friday., Disp: 15 tablet, Rfl: 3 .  Misc. Devices MISC, Please provide patient with XL sized insurance approved compression stockings. ICD 10 R60.0 and  I50.32, Disp: 1 each, Rfl: 0 .  omeprazole (PRILOSEC) 20 MG capsule, TAKE 1 CAPSULE (20 MG TOTAL) BY MOUTH 2 (TWO) TIMES DAILY BEFORE A MEAL. TAKE FIRST CAPSULE 30 MIN PRIOR TO EATING OR TAKING OTHER MEDICATIONS., Disp: 60 capsule, Rfl: 2 .  potassium chloride (KLOR-CON) 10 MEQ tablet, TAKE 60 MEQ (6 TABS) IN THE MORNING, 60 MEQ (6 TABS) IN THE AFTERNOON AND 40 MEQ (4 TABS) IN THE EVENING. TAKE AN EXTRA 4 TABS EVERY DOSE OF METOLAZONE, Disp: 540 tablet, Rfl: 3 .  rivaroxaban (XARELTO) 20 MG TABS tablet, Take 1 tablet (20 mg total) by mouth daily with supper., Disp: 60 tablet, Rfl: 6 .  spironolactone (ALDACTONE) 50 MG tablet, TAKE 1 TABLET (50 MG TOTAL) BY MOUTH DAILY., Disp: 30 tablet, Rfl: 3 .  torsemide (DEMADEX) 100 MG tablet, Take 1 tablet (100 mg total) by mouth 2 (two) times daily., Disp: 180 tablet, Rfl: 3 .  TRUEplus Lancets 28G MISC, Use as instructed. Check blood glucose level by fingerstick twice per day. (Patient taking differently: 1 each by Other route See admin instructions. Use as instructed. Check blood glucose level by fingerstick twice per day.), Disp: 200 each, Rfl: 3 .  atorvastatin (LIPITOR) 40 MG tablet, Take 1 tablet (40 mg total) by mouth at bedtime., Disp: 90 tablet, Rfl: 2 .  Misc. Devices MISC, Requires O2 @ 3L/min continuously via nasal canula and home fill system,. (Patient taking differently: 1 each by Other route See admin instructions. Requires O2 @ 3L/min continuously via nasal canula and home fill system,.), Disp: 1 each, Rfl: 0 .  Misc. Devices MISC, Please provide Benjamin. [place]oy with insurance approved portable O2  concentrator ICD 10 J96.11 [id number] (Patient taking differently: 1 each by Other route See admin instructions. Please provide Benjamin. [place] with insurance approved portable O2 concentrator ICD 10 J96.11 Z99.81), Disp: 1 each, Rfl: 0 .  PROVENTIL HFA 108 (90 Base) MCG/ACT inhaler, INHALE 2 PUFFS BY MOUTH EVERY 6 (SIX) HOURS AS NEEDED FOR WHEEZING OR SHORTNESS OF BREATH., Disp: 6.7 g, Rfl: 2 No Known Allergies    Social History   Socioeconomic History  . Marital status: Single    Spouse name: Not on file  . Number of children: Not on file  . Years of education: Not on file  . Highest education level: Not on file  Occupational History  . Not on file  Tobacco Use  . Smoking status: Former Research scientist (life sciences)  . Smokeless tobacco: Never Used  Vaping Use  . Vaping Use: Never used  Substance and Sexual Activity  . Alcohol use: Not Currently  . Drug use: Not Currently  . Sexual activity: Not Currently  Other Topics Concern  . Not on file  Social History Narrative  . Not on file   Social Determinants of Health   Financial Resource Strain: Medium Risk  . Difficulty of Paying Living Expenses: Somewhat hard  Food Insecurity: Food Insecurity Present  . Worried About Charity fundraiser in the Last Year: Sometimes true  . Ran Out of Food in the Last Year: Sometimes true  Transportation Needs: No Transportation Needs  . Lack of Transportation (Medical): No  . Lack of Transportation (Non-Medical): No  Physical Activity:   . Days of Exercise per Week: Not on file  . Minutes of Exercise per Session: Not on file  Stress:   . Feeling of Stress : Not on file  Social Connections:   . Frequency of Communication with Friends and Family: Not on file  . Frequency of Social Gatherings with Friends and Family: Not on file  . Attends Religious Services: Not on file  . Active Member of Clubs or Organizations: Not on file  . Attends Archivist Meetings: Not on file  . Marital Status: Not on file   Intimate Partner Violence:   . Fear of Current or Ex-Partner: Not on file  . Emotionally Abused: Not on file  . Physically Abused: Not on file  . Sexually Abused: Not on file    Physical Exam Cardiovascular:     Rate and Rhythm: Normal rate and regular rhythm.     Pulses: Normal pulses.  Pulmonary:     Effort: Pulmonary effort is normal.     Breath sounds: Normal breath sounds.  Musculoskeletal:        General: Normal range of motion.     Right lower leg: No edema.     Left lower leg: No edema.  Skin:    General: Skin is warm and dry.     Capillary Refill: Capillary refill takes less than 2 seconds.  Neurological:     Mental Status: He is alert and oriented to person, place, and time.  Psychiatric:        Mood and Affect: Mood normal.         Future Appointments  Date Time Provider [place]  07/02/2020  1:30 PM [facility] PA/NP [facility] None  07/15/2020  9:30 AM [name], NP [facility] None  08/04/2020 12:00 PM [name], MD [facility] None    BP (!) 196/121 (BP Location: Left Arm, Patient Position: Sitting, Cuff Size: Large)   Pulse 88   Resp 18   Wt (!) 363 lb (164.7 kg)   SpO2 98%   BMI 52.09 kg/m   Weight yesterday- 363 lb Last visit weight- 369 lb  Benjamin [name] was seen at home today and reported feeling well. He denied chest pain, SOB, headache, dizziness, orthopnea, fever or cough. He stated he has been compliant with his medications over the past week and his weight has been stable. His pill packs were checked for accuracy. His pillbox was filled with potassium as prescribed. Benjamin [name] will follow up upon her return next week however I gave him my card so he could reach out if he needs assistance before Katie's return.   Benjamin [name], EMT 06/22/20  ACTION: Home visit completed Next visit planned for 1 week

## 2020-06-23 ENCOUNTER — Telehealth (HOSPITAL_COMMUNITY): Payer: Self-pay | Admitting: Licensed Clinical Social Worker

## 2020-06-23 NOTE — Telephone Encounter (Signed)
Pt called CSW to ensure he did not have appt this week.  CSW provided pt with appt details for appt with [place] next week on 10/14.  Encouraged pt to set up transport through his Medicaid today and let me know that it has been done- pt confirms he will call them this afternoon.  No other needs reported at this time  Will continue to follow and assist as needed  [name], LCSW Clinical Social Worker [facility] Desk#: [phone] Cell#: [phone]

## 2020-06-29 ENCOUNTER — Telehealth (HOSPITAL_COMMUNITY): Payer: Self-pay | Admitting: Licensed Clinical Social Worker

## 2020-06-29 NOTE — Telephone Encounter (Signed)
CSW able to locate dentist accepting new Adult Medicaid clients.  Dr. [facility]incus [facility] with [facility] able to see patient tomorrow at 10:30am  Too late to set up ride through [facility] transport so CSW set up through [employer]- ride confirmed and patient aware  Will continue to follow and assist as needed  [name], LCSW Clinical Social Worker [facility] Desk#: [phone] Cell#: [phone]

## 2020-06-30 ENCOUNTER — Telehealth (HOSPITAL_COMMUNITY): Payer: Self-pay | Admitting: Licensed Clinical Social Worker

## 2020-06-30 ENCOUNTER — Other Ambulatory Visit (HOSPITAL_COMMUNITY): Payer: Self-pay

## 2020-06-30 NOTE — Progress Notes (Signed)
Paramedicine Encounter    Patient ID: Benjamin [name], male    DOB: June 15, 1966, 54 y.o.   MRN: [id number]   Patient Care Team: [name], NP as PCP - General (Nurse Practitioner) [name], MD as PCP - Cardiology (Cardiology) [name], LCSW as Social Worker (Licensed Clinical Social Worker)  Patient Active Problem List   Diagnosis Date Noted  . Obesity hypoventilation syndrome ([place]) 06/12/2020  . Healthcare maintenance 06/12/2020  . Acute on chronic diastolic heart failure ([place]) 02/24/2020  . Diabetic neuropathy ([place]) 11/20/2019  . Coagulation disorder ([place]) 11/20/2019  . Acute on chronic diastolic CHF (congestive heart failure), NYHA class 3 ([place]) 07/30/2019  . Diabetes mellitus without complication ([place]) 09/40/7680  . Hypertensive emergency   . BRBPR (bright red blood per rectum) 06/12/2019  . Hypertension   . CHF (congestive heart failure) ([place])   . Sleep apnea   . Chronic respiratory failure ([place])   . Hypokalemia 05/29/2019  . GI bleed 05/29/2019  . Anemia 05/28/2019  . Acute on chronic diastolic CHF (congestive heart failure) ([place]) 04/11/2019  . Iron deficiency anemia 03/11/2019  . OSA on CPAP 03/11/2019  . HLD (hyperlipidemia) 03/11/2019  . Elevated troponin 03/11/2019  . GERD (gastroesophageal reflux disease) 03/11/2019  . Rectal bleeding 02/20/2019  . Dyspnea 02/06/2019  . COVID-19 virus infection 02/06/2019  . Bilateral lower extremity edema   . Morbid obesity with BMI of 50.0-59.9, adult ([place])   . Chronic diastolic CHF (congestive heart failure) ([place])   . PAF (paroxysmal atrial fibrillation) ([place])   . PE (pulmonary thromboembolism) ([place]) 01/21/2019  . Hypertensive urgency 01/21/2019  . Diabetes mellitus type 2 in obese ([place]) 01/21/2019    Current Outpatient Medications:  .  amLODipine (NORVASC) 10 MG tablet, TAKE ONE TABLET BY MOUTH ONCE DAILY, Disp: 30 tablet, Rfl: 2 .  atorvastatin (LIPITOR) 40 MG tablet, Take 1 tablet (40 mg total) by  mouth at bedtime., Disp: 90 tablet, Rfl: 2 .  Blood Glucose Monitoring Suppl (TRUE METRIX METER) w/Device KIT, Use as instructed. Check blood glucose level by fingerstick twice per day. (Patient taking differently: 1 each by Other route See admin instructions. Use as instructed. Check blood glucose level by fingerstick twice per day.), Disp: 1 kit, Rfl: 0 .  Blood Pressure Monitor DEVI, Please provide patient with insurance approved blood pressure device with L-XL cuff. BMI 55 (Patient taking differently: 1 each by Other route See admin instructions. Please provide patient with insurance approved blood pressure device with L-XL cuff. BMI 55), Disp: 1 each, Rfl: 0 .  carboxymethylcellulose (REFRESH PLUS) 0.5 % SOLN, Place 1 drop into both eyes daily. , Disp: , Rfl:  .  docusate sodium (COLACE) 100 MG capsule, Take 1 capsule (100 mg total) by mouth 2 (two) times daily., Disp: 180 capsule, Rfl: 2 .  doxazosin (CARDURA) 1 MG tablet, Take 1 tablet (1 mg total) by mouth daily., Disp: 30 tablet, Rfl: 11 .  ferrous sulfate 325 (65 FE) MG tablet, Take 1 tablet (325 mg total) by mouth daily., Disp: 90 tablet, Rfl: 2 .  glucose blood (TRUE METRIX BLOOD GLUCOSE TEST) test strip, Use as instructed. Check blood glucose level by fingerstick twice per day. (Patient taking differently: 1 each by Other route See admin instructions. Use as instructed. Check blood glucose level by fingerstick twice per day.), Disp: 100 each, Rfl: 12 .  labetalol (NORMODYNE) 200 MG tablet, TAKE 2 TABLETS (400 MG TOTAL) BY MOUTH 2 (TWO) TIMES DAILY., Disp: 120  tablet, Rfl: 3 .  losartan (COZAAR) 100 MG tablet, Take 1 tablet (100 mg total) by mouth daily., Disp: 30 tablet, Rfl: 11 .  magnesium oxide (MAG-OX) 400 (241.3 Mg) MG tablet, TAKE 2 TABLETS BY MOUTH EVERY MORNING AND 1 TABLET EVERY EVENING (AM+NOON+BEDTIME), Disp: 90 tablet, Rfl: 3 .  metFORMIN (GLUCOPHAGE) 500 MG tablet, TAKE ONE TABLET BY MOUTH TWICE DAILY WITH A MEAL (NOON+BEDTIME),  Disp: 180 tablet, Rfl: 0 .  methocarbamol (ROBAXIN) 500 MG tablet, Take 1 tablet (500 mg total) by mouth every 8 (eight) hours as needed for muscle spasms., Disp: 15 tablet, Rfl: 1 .  metolazone (ZAROXOLYN) 5 MG tablet, Take 1 tablet (5 mg total) by mouth See admin instructions. Take 1 tablet every Monday, Wednesday, and Friday., Disp: 15 tablet, Rfl: 3 .  Misc. Devices MISC, Requires O2 @ 3L/min continuously via nasal canula and home fill system,. (Patient taking differently: 1 each by Other route See admin instructions. Requires O2 @ 3L/min continuously via nasal canula and home fill system,.), Disp: 1 each, Rfl: 0 .  Misc. Devices MISC, Please provide Mr. Kinyon with insurance approved portable O2 concentrator ICD 10 J96.11 [phone] (Patient taking differently: 1 each by Other route See admin instructions. Please provide Mr. Toruno with insurance approved portable O2 concentrator ICD 10 J96.11 Z99.81), Disp: 1 each, Rfl: 0 .  Misc. Devices MISC, Please provide patient with XL sized insurance approved compression stockings. ICD 10 R60.0 and  I50.32, Disp: 1 each, Rfl: 0 .  omeprazole (PRILOSEC) 20 MG capsule, TAKE 1 CAPSULE (20 MG TOTAL) BY MOUTH 2 (TWO) TIMES DAILY BEFORE A MEAL. TAKE FIRST CAPSULE 30 MIN PRIOR TO EATING OR TAKING OTHER MEDICATIONS., Disp: 60 capsule, Rfl: 2 .  potassium chloride (KLOR-CON) 10 MEQ tablet, TAKE 60 MEQ (6 TABS) IN THE MORNING, 60 MEQ (6 TABS) IN THE AFTERNOON AND 40 MEQ (4 TABS) IN THE EVENING. TAKE AN EXTRA 4 TABS EVERY DOSE OF METOLAZONE, Disp: 540 tablet, Rfl: 3 .  PROVENTIL HFA 108 (90 Base) MCG/ACT inhaler, INHALE 2 PUFFS BY MOUTH EVERY 6 (SIX) HOURS AS NEEDED FOR WHEEZING OR SHORTNESS OF BREATH., Disp: 6.7 g, Rfl: 2 .  rivaroxaban (XARELTO) 20 MG TABS tablet, Take 1 tablet (20 mg total) by mouth daily with supper., Disp: 60 tablet, Rfl: 6 .  spironolactone (ALDACTONE) 50 MG tablet, TAKE 1 TABLET (50 MG TOTAL) BY MOUTH DAILY., Disp: 30 tablet, Rfl: 3 .  torsemide  (DEMADEX) 100 MG tablet, Take 1 tablet (100 mg total) by mouth 2 (two) times daily., Disp: 180 tablet, Rfl: 3 .  TRUEplus Lancets 28G MISC, Use as instructed. Check blood glucose level by fingerstick twice per day. (Patient taking differently: 1 each by Other route See admin instructions. Use as instructed. Check blood glucose level by fingerstick twice per day.), Disp: 200 each, Rfl: 3 No Known Allergies    Social History   Socioeconomic History  . Marital status: Single    Spouse name: Not on file  . Number of children: Not on file  . Years of education: Not on file  . Highest education level: Not on file  Occupational History  . Not on file  Tobacco Use  . Smoking status: Former Research scientist (life sciences)  . Smokeless tobacco: Never Used  Vaping Use  . Vaping Use: Never used  Substance and Sexual Activity  . Alcohol use: Not Currently  . Drug use: Not Currently  . Sexual activity: Not Currently  Other Topics Concern  . Not on file  Social History Narrative  . Not on file   Social Determinants of Health   Financial Resource Strain: Medium Risk  . Difficulty of Paying Living Expenses: Somewhat hard  Food Insecurity: Food Insecurity Present  . Worried About Charity fundraiser in the Last Year: Sometimes true  . Ran Out of Food in the Last Year: Sometimes true  Transportation Needs: No Transportation Needs  . Lack of Transportation (Medical): No  . Lack of Transportation (Non-Medical): No  Physical Activity:   . Days of Exercise per Week: Not on file  . Minutes of Exercise per Session: Not on file  Stress:   . Feeling of Stress : Not on file  Social Connections:   . Frequency of Communication with Friends and Family: Not on file  . Frequency of Social Gatherings with Friends and Family: Not on file  . Attends Religious Services: Not on file  . Active Member of Clubs or Organizations: Not on file  . Attends Archivist Meetings: Not on file  . Marital Status: Not on file   Intimate Partner Violence:   . Fear of Current or Ex-Partner: Not on file  . Emotionally Abused: Not on file  . Physically Abused: Not on file  . Sexually Abused: Not on file    Physical Exam Cardiovascular:     Rate and Rhythm: Normal rate and regular rhythm.     Pulses: Normal pulses.  Pulmonary:     Effort: Pulmonary effort is normal.     Breath sounds: Normal breath sounds.  Musculoskeletal:        General: Normal range of motion.     Right lower leg: No edema.     Left lower leg: No edema.  Skin:    General: Skin is warm and dry.     Capillary Refill: Capillary refill takes less than 2 seconds.  Neurological:     Mental Status: He is alert and oriented to person, place, and time.  Psychiatric:        Mood and Affect: Mood normal.         Future Appointments  Date Time Provider [place]  07/02/2020  1:30 PM [facility] PA/NP [facility] None  07/15/2020  9:30 AM [name], NP [facility] None  08/04/2020 12:00 PM [name], MD [facility] None    BP (!) 170/110 (BP Location: Left Arm, Patient Position: Sitting, Cuff Size: Large)   Pulse 90   Resp 16   Wt (!) 360 lb (163.3 kg)   SpO2 94%   BMI 51.65 kg/m   Weight yesterday- 360 lb Last visit weight- 363 lb  Mr Blaze was seen at home today and rpeorted feeling well. He denied chest pain, SOB, headache, dizziness, orthopnea, fever or cough over the past week. He stated he has been compliant with his medications over the past week and his weight has been stable. His medications were verified and potassium was loaded into his pillbox. Myself or Joellen Jersey will follow up next week.   Jacquiline Doe, EMT 06/30/20  ACTION: Home visit completed Next visit planned for 1 week

## 2020-06-30 NOTE — Telephone Encounter (Signed)
CSW received call from pt requesting CSW send current medication list to the dentist office- CSW able to fax list to them.  Pt reports he will have to get root canal at another office which they referred him to.  CSW will assist pt in following up.  CSW also called [facility] transport and confirmed that pt is set up for ride on Thursday for clinic appt.  Will continue to follow and assist as needed  [name], LCSW Clinical Social Worker [facility] Desk#: [phone] Cell#: [phone]

## 2020-07-02 ENCOUNTER — Ambulatory Visit (HOSPITAL_COMMUNITY)
Admission: RE | Admit: 2020-07-02 | Discharge: 2020-07-02 | Disposition: A | Discharge status: Home / Self Care | Payer: Medicaid Other | Source: Ambulatory Visit | Attending: Cardiology | Admitting: Cardiology

## 2020-07-02 ENCOUNTER — Other Ambulatory Visit (HOSPITAL_COMMUNITY): Payer: Self-pay | Admitting: Adult Health

## 2020-07-02 ENCOUNTER — Other Ambulatory Visit: Payer: Self-pay

## 2020-07-02 ENCOUNTER — Other Ambulatory Visit (HOSPITAL_COMMUNITY): Payer: Self-pay

## 2020-07-02 ENCOUNTER — Other Ambulatory Visit (HOSPITAL_COMMUNITY): Payer: Self-pay | Admitting: Cardiology

## 2020-07-02 ENCOUNTER — Encounter (HOSPITAL_COMMUNITY): Payer: Self-pay

## 2020-07-02 VITALS — BP 218/114 | HR 84 | Wt 359.2 lb

## 2020-07-02 DIAGNOSIS — Z86711 Personal history of pulmonary embolism: Secondary | ICD-10-CM | POA: Insufficient documentation

## 2020-07-02 DIAGNOSIS — R002 Palpitations: Secondary | ICD-10-CM | POA: Insufficient documentation

## 2020-07-02 DIAGNOSIS — Z6841 Body Mass Index (BMI) 40.0 and over, adult: Secondary | ICD-10-CM | POA: Diagnosis not present

## 2020-07-02 DIAGNOSIS — D649 Anemia, unspecified: Secondary | ICD-10-CM | POA: Diagnosis not present

## 2020-07-02 DIAGNOSIS — Z79899 Other long term (current) drug therapy: Secondary | ICD-10-CM | POA: Diagnosis not present

## 2020-07-02 DIAGNOSIS — I5033 Acute on chronic diastolic (congestive) heart failure: Secondary | ICD-10-CM | POA: Diagnosis not present

## 2020-07-02 DIAGNOSIS — Z8249 Family history of ischemic heart disease and other diseases of the circulatory system: Secondary | ICD-10-CM | POA: Insufficient documentation

## 2020-07-02 DIAGNOSIS — K219 Gastro-esophageal reflux disease without esophagitis: Secondary | ICD-10-CM | POA: Insufficient documentation

## 2020-07-02 DIAGNOSIS — I480 Paroxysmal atrial fibrillation: Secondary | ICD-10-CM | POA: Insufficient documentation

## 2020-07-02 DIAGNOSIS — Z7984 Long term (current) use of oral hypoglycemic drugs: Secondary | ICD-10-CM | POA: Insufficient documentation

## 2020-07-02 DIAGNOSIS — J9611 Chronic respiratory failure with hypoxia: Secondary | ICD-10-CM | POA: Diagnosis not present

## 2020-07-02 DIAGNOSIS — E119 Type 2 diabetes mellitus without complications: Secondary | ICD-10-CM | POA: Insufficient documentation

## 2020-07-02 DIAGNOSIS — Z87891 Personal history of nicotine dependence: Secondary | ICD-10-CM | POA: Insufficient documentation

## 2020-07-02 DIAGNOSIS — G4733 Obstructive sleep apnea (adult) (pediatric): Secondary | ICD-10-CM | POA: Insufficient documentation

## 2020-07-02 DIAGNOSIS — I110 Hypertensive heart disease with heart failure: Secondary | ICD-10-CM | POA: Diagnosis not present

## 2020-07-02 DIAGNOSIS — Z9981 Dependence on supplemental oxygen: Secondary | ICD-10-CM | POA: Insufficient documentation

## 2020-07-02 DIAGNOSIS — Z7901 Long term (current) use of anticoagulants: Secondary | ICD-10-CM | POA: Diagnosis not present

## 2020-07-02 DIAGNOSIS — I5032 Chronic diastolic (congestive) heart failure: Secondary | ICD-10-CM | POA: Diagnosis not present

## 2020-07-02 DIAGNOSIS — U071 COVID-19: Secondary | ICD-10-CM | POA: Diagnosis not present

## 2020-07-02 LAB — CBC
HCT: 46.3 % (ref 39.0–52.0)
Hemoglobin: 13.1 g/dL (ref 13.0–17.0)
MCH: 21.5 pg — ABNORMAL LOW (ref 26.0–34.0)
MCHC: 28.3 g/dL — ABNORMAL LOW (ref 30.0–36.0)
MCV: 76.2 fL — ABNORMAL LOW (ref 80.0–100.0)
Platelets: 402 10*3/uL — ABNORMAL HIGH (ref 150–400)
RBC: 6.08 MIL/uL — ABNORMAL HIGH (ref 4.22–5.81)
RDW: 17.3 % — ABNORMAL HIGH (ref 11.5–15.5)
WBC: 5.6 10*3/uL (ref 4.0–10.5)
nRBC: 0 % (ref 0.0–0.2)

## 2020-07-02 LAB — BASIC METABOLIC PANEL
Anion gap: 13 (ref 5–15)
BUN: 22 mg/dL — ABNORMAL HIGH (ref 6–20)
CO2: 29 mmol/L (ref 22–32)
Calcium: 8.7 mg/dL — ABNORMAL LOW (ref 8.9–10.3)
Chloride: 96 mmol/L — ABNORMAL LOW (ref 98–111)
Creatinine, Ser: 1.38 mg/dL — ABNORMAL HIGH (ref 0.61–1.24)
GFR, Estimated: 58 mL/min — ABNORMAL LOW (ref >60)
Glucose, Bld: 93 mg/dL (ref 70–99)
Potassium: 2.7 mmol/L — CL (ref 3.5–5.1)
Sodium: 138 mmol/L (ref 135–145)

## 2020-07-02 MED ORDER — DOXAZOSIN MESYLATE 2 MG PO TABS: 2.0000 mg | ORAL_TABLET | Freq: Every day | ORAL | 11 refills | Status: DC

## 2020-07-02 NOTE — Progress Notes (Signed)
Paramedicine Encounter   Patient ID: Benjamin [name] , male,   DOB: 1966-03-05,54 y.o.,  MRN: [id number]   Met patient in clinic today with provider.   Weight @ clinic-359 B/p-218/114 p-84 [id number]  Pt looks good today. His weight is down more.  Going to gym several times a week.  Legs look great today.  No bleeding issues.  He is still feeling that fluttering in his chest. He will get EKG today. It did show just a couple PVC's occasionally.  His doxazosin is being increased to 21m. This was relayed to pharmacy and it will be in new bubble pack. I will pick up Monday and take to him.  ---I relayed the lab results to pt via text since he was not home, I asked chantel to see if he can get a cab for his repeat labs tomor.  Will see him next week.  jenna is going to f/u on the CPAP situation.   KMarylouise Stacks [place]10/14/2021

## 2020-07-02 NOTE — Patient Instructions (Signed)
INCREASE Cardura to 2 mg, one tab daily  Labs today We will only contact you if something comes back abnormal or we need to make some changes. Otherwise no news is good news!  Your physician recommends that you schedule a follow-up appointment in: 2-3 weeks with pharmacy team  Your physician recommends that you schedule a follow-up appointment in: 3 months  in the Advanced Practitioners (PA/NP) Clinic    If you have any questions or concerns before your next appointment please send [place] a message through [place] or call our office at [phone].    TO LEAVE A MESSAGE FOR THE NURSE SELECT OPTION 2, PLEASE LEAVE A MESSAGE INCLUDING: . YOUR NAME . DATE OF BIRTH . CALL BACK NUMBER . REASON FOR CALL**this is important as we prioritize the call backs  YOU WILL RECEIVE A CALL BACK THE SAME DAY AS LONG AS YOU CALL BEFORE 4:00 PM

## 2020-07-02 NOTE — Progress Notes (Signed)
[facility] Clinic Note   Referring Physician: PCP: [name], NP PCP-Cardiologist: Dorris Carnes, MD  [facility]: Dr. Aundra Dubin   HPI: Mr.Williamsis a 54 yo with hx of diastolic CHF, HTN, DM, Atrial fib, PE in April 2020 (on Xarelto), OSA, GERD,chronic elevation of troponin.  Admitted 01/21/2019 with increased dyspnea and had PE. He was anticoagulated.   Patient re-admitted 5/20 for syncope/cough/dyspnea, COVID+, CT positive for bilateral GGOs, admitted to [facility] and discharged after 6 days, on O2. Rec'd Cards f/u.  Patient re-admitted 02/20/19 for bright red blood per rectum, eval'ed by GI, had EGD/colon, dx'd with hemorrhoidal bleeding, discharged after 9 days on O2.He was again Covid + .   Readmitted 03/11/19 with increased shortness of breath. He did not have home oxygen. He was discharged to home the next day.  He presented to [facility] ED7/23/20with lower extremity edema. COVID negative. Diuresed with lasix drip and transitioned to torsemide 40 mg twice daily. Had [place] with mild volume overload and preserved cardiac output. Discharge weight was 407.9 pounds.   He was readmitted with symptomatic anemia 05/28/19 with hgb 7.1. Received 1UPRBCs. GI consulted. He started on anusol and continued on stool softner.  He was readmitted to the hospital on 07/16/19 when he presented to the ED w/ worsening symptoms and was admitted by IM for a/c diastolic CHF and treated w/ IV Lasix. Had 2 day hospital stay and was discharged on 10/29. Echo was done 10/28 and showed normal LVEF 65-70%, RV interpreted as normal but suspect significant RV dysfunction.  After diuresis w/ IV Lasix, he was placed back on home diuretic regimen, torsemide 100 mg bid. Lisinopril 5 mg was also added to regimen. AHF team was not consulted that admit.  He was seen back in clinic 07/30/19. Felt poorly. SOB at rest and worse w/ exertion. Also w/ orthopnea/PND. Significant wt gain of 20 lb up to 428 lb (dry wt ~407 lb),  w/ marked abdominal distention and LEE and poor response to IV diuretics.  Once diuresed transitioned to torsemide 100 mg twice a day. D/C 408 pounds.   Zio patch on 10/20/19-No arrhythmia.   He returns to clinic today for f/u. Doing better. Still followed by paramedicine. Has made several lifestyle changes. Now enrolled in a wellness program and exercises 2-3 days a week. Has modified diet and has lost ~70 lb. Fluid status ok on exam. No resting dyspnea. BP remains elevated, always high at MD appts. Paramedicine reports persistent, moderately elevated BP at home. He is currently out of a CPAP device. His was recalled and he is awaiting a replacement. He notes some occasional palpitations but EKG shows NSR. HR well controlled. Reports full compliance w/ Xarelto. No abnormal bleeding.   Labs (11/20): BNP 32, K 3.6, creatinine 1.09  Labs (12/20): K 3.3, creatinine 1.28 Labs (10/10/19): K 3.9 Creatinine 1.4  Labs (11/05/19) : K 3.2 Creatinine 1.47  Labs (12/30/2019) : K 3.5 Creatinine 1.14  Labs (03/02/2020): K 3.8 Creatinine 1.09.   Review of Systems: All systems reviewed and negative except as per HPI.   PMH: 1. Atrial fibrillation: Paroxysmal.  2. Pulmonary embolus: 5/20.  3. OHS/OSA: He is on home oxygen during the day and uses CPAP at night.  4. Morbid obesity.  5. Chronic diastolic CHF:  - RHC (9/47): mean RA 12, PA 40/25 mean 31, mean PCWP 23, CI 2.47, PVR 1.1 WU.  - Echo (10/20): EF 65-70%, mild LVH, normal RV size and systolic function.  6. Type 2 diabetes  7. HTN 8. Rectal bleeding: ?Hemorrhoidal.  9. COVID-19 infection 7/20.  1-. ZIo Patch - 10/20/19 no arrhythmias  Current Outpatient Medications  Medication Sig Dispense Refill  . amLODipine (NORVASC) 10 MG tablet TAKE ONE TABLET BY MOUTH ONCE DAILY 30 tablet 2  . Blood Glucose Monitoring Suppl (TRUE METRIX METER) w/Device KIT Use as instructed. Check blood glucose level by fingerstick twice per day. (Patient taking differently: 1  each by Other route See admin instructions. Use as instructed. Check blood glucose level by fingerstick twice per day.) 1 kit 0  . Blood Pressure Monitor DEVI Please provide patient with insurance approved blood pressure device with L-XL cuff. BMI 55 (Patient taking differently: 1 each by Other route See admin instructions. Please provide patient with insurance approved blood pressure device with L-XL cuff. BMI 55) 1 each 0  . carboxymethylcellulose (REFRESH PLUS) 0.5 % SOLN Place 1 drop into both eyes daily.     Marland Kitchen docusate sodium (COLACE) 100 MG capsule Take 1 capsule (100 mg total) by mouth 2 (two) times daily. 180 capsule 2  . doxazosin (CARDURA) 1 MG tablet Take 1 tablet (1 mg total) by mouth daily. 30 tablet 11  . ferrous sulfate 325 (65 FE) MG tablet Take 1 tablet (325 mg total) by mouth daily. 90 tablet 2  . glucose blood (TRUE METRIX BLOOD GLUCOSE TEST) test strip Use as instructed. Check blood glucose level by fingerstick twice per day. (Patient taking differently: 1 each by Other route See admin instructions. Use as instructed. Check blood glucose level by fingerstick twice per day.) 100 each 12  . labetalol (NORMODYNE) 200 MG tablet TAKE 2 TABLETS (400 MG TOTAL) BY MOUTH 2 (TWO) TIMES DAILY. 120 tablet 3  . losartan (COZAAR) 100 MG tablet Take 1 tablet (100 mg total) by mouth daily. 30 tablet 11  . magnesium oxide (MAG-OX) 400 (241.3 Mg) MG tablet TAKE 2 TABLETS BY MOUTH EVERY MORNING AND 1 TABLET EVERY EVENING (AM+NOON+BEDTIME) 90 tablet 3  . metFORMIN (GLUCOPHAGE) 500 MG tablet TAKE ONE TABLET BY MOUTH TWICE DAILY WITH A MEAL (NOON+BEDTIME) 180 tablet 0  . methocarbamol (ROBAXIN) 500 MG tablet Take 1 tablet (500 mg total) by mouth every 8 (eight) hours as needed for muscle spasms. 15 tablet 1  . metolazone (ZAROXOLYN) 5 MG tablet Take 1 tablet (5 mg total) by mouth See admin instructions. Take 1 tablet every Monday, Wednesday, and Friday. 15 tablet 3  . Misc. Devices MISC Requires O2 @  3L/min continuously via nasal canula and home fill system,. (Patient taking differently: 1 each by Other route See admin instructions. Requires O2 @ 3L/min continuously via nasal canula and home fill system,.) 1 each 0  . Misc. Devices MISC Please provide Mr. Dietrick with insurance approved portable O2 concentrator ICD 10 J96.11 [phone] (Patient taking differently: 1 each by Other route See admin instructions. Please provide Mr. Wiswell with insurance approved portable O2 concentrator ICD 10 J96.11 Z99.81) 1 each 0  . Misc. Devices MISC Please provide patient with XL sized insurance approved compression stockings. ICD 10 R60.0 and  I50.32 1 each 0  . potassium chloride (KLOR-CON) 10 MEQ tablet TAKE 60 MEQ (6 TABS) IN THE MORNING, 60 MEQ (6 TABS) IN THE AFTERNOON AND 40 MEQ (4 TABS) IN THE EVENING. TAKE AN EXTRA 4 TABS EVERY DOSE OF METOLAZONE 540 tablet 3  . PROVENTIL HFA 108 (90 Base) MCG/ACT inhaler INHALE 2 PUFFS BY MOUTH EVERY 6 (SIX) HOURS AS NEEDED FOR WHEEZING OR SHORTNESS OF  BREATH. 6.7 g 2  . rivaroxaban (XARELTO) 20 MG TABS tablet Take 1 tablet (20 mg total) by mouth daily with supper. 60 tablet 6  . spironolactone (ALDACTONE) 50 MG tablet TAKE 1 TABLET (50 MG TOTAL) BY MOUTH DAILY. 30 tablet 3  . torsemide (DEMADEX) 100 MG tablet Take 1 tablet (100 mg total) by mouth 2 (two) times daily. 180 tablet 3  . TRUEplus Lancets 28G MISC Use as instructed. Check blood glucose level by fingerstick twice per day. (Patient taking differently: 1 each by Other route See admin instructions. Use as instructed. Check blood glucose level by fingerstick twice per day.) 200 each 3  . atorvastatin (LIPITOR) 40 MG tablet Take 1 tablet (40 mg total) by mouth at bedtime. 90 tablet 2  . omeprazole (PRILOSEC) 20 MG capsule TAKE 1 CAPSULE (20 MG TOTAL) BY MOUTH 2 (TWO) TIMES DAILY BEFORE A MEAL. TAKE FIRST CAPSULE 30 MIN PRIOR TO EATING OR TAKING OTHER MEDICATIONS. 60 capsule 2   No current facility-administered  medications for this encounter.    No Known Allergies    Social History   Socioeconomic History  . Marital status: Single    Spouse name: Not on file  . Number of children: Not on file  . Years of education: Not on file  . Highest education level: Not on file  Occupational History  . Not on file  Tobacco Use  . Smoking status: Former Research scientist (life sciences)  . Smokeless tobacco: Never Used  Vaping Use  . Vaping Use: Never used  Substance and Sexual Activity  . Alcohol use: Not Currently  . Drug use: Not Currently  . Sexual activity: Not Currently  Other Topics Concern  . Not on file  Social History Narrative  . Not on file   Social Determinants of Health   Financial Resource Strain: Medium Risk  . Difficulty of Paying Living Expenses: Somewhat hard  Food Insecurity: Food Insecurity Present  . Worried About Charity fundraiser in the Last Year: Sometimes true  . Ran Out of Food in the Last Year: Sometimes true  Transportation Needs: No Transportation Needs  . Lack of Transportation (Medical): No  . Lack of Transportation (Non-Medical): No  Physical Activity:   . Days of Exercise per Week: Not on file  . Minutes of Exercise per Session: Not on file  Stress:   . Feeling of Stress : Not on file  Social Connections:   . Frequency of Communication with Friends and Family: Not on file  . Frequency of Social Gatherings with Friends and Family: Not on file  . Attends Religious Services: Not on file  . Active Member of Clubs or Organizations: Not on file  . Attends Archivist Meetings: Not on file  . Marital Status: Not on file  Intimate Partner Violence:   . Fear of Current or Ex-Partner: Not on file  . Emotionally Abused: Not on file  . Physically Abused: Not on file  . Sexually Abused: Not on file      Family History  Problem Relation Age of Onset  . Hypertension Mother   . Diabetes Mother     Vitals:   07/02/20 1329  BP: (!) 218/114  Pulse: 84  SpO2: 98%   Weight: (!) 162.9 kg (359 lb 3.2 oz)   Wt Readings from Last 3 Encounters:  07/02/20 (!) 162.9 kg (359 lb 3.2 oz)  06/30/20 (!) 163.3 kg (360 lb)  06/22/20 (!) 164.7 kg (363 lb)  PHYSICAL EXAM: General:  Obese male. No respiratory difficulty HEENT: normal Neck: supple. Thick neck, no JVD. Carotids 2+ bilat; no bruits. No lymphadenopathy or thyromegaly appreciated. Cor: PMI nondisplaced. Regular rate & rhythm. No rubs, gallops or murmurs. Lungs: clear Abdomen: obese, soft, nontender, nondistended. No hepatosplenomegaly. No bruits or masses. Good bowel sounds. Extremities: no cyanosis, clubbing, rash, edema, chronic venous stasis dermatitis bilaterally  Neuro: alert & oriented x 3, cranial nerves grossly intact. moves all 4 extremities w/o difficulty. Affect pleasant.   ASSESSMENT & PLAN:  1. Chronic diastolic CHF: Echo in 02/98 with EF 65-70%, RV interpreted as normal but suspect significant RV dysfunction.  -NYHA II-III, suspect obesity is a contributing factor. Volume status stable, has lost significant wt through diet and exercise.  - Continue torsemide 100 mg bid and metolazone 2.5 mg three times a week.  - Continue current dose of potassium  - Continue spironolactone to 50 mg daily.  - Check BMP today  2. Atrial fibrillation: Paroxysmal. NSR on EKG today  - denies abnormal bleeding  - Continue Xarelto. Check CBC today  3. H/o PE: 5/20, continue Xarelto.  4. OHS/OSA: CPAP device was recalled, currently waiting on replacement - encouraged to resume nightly use once he has new device  5. HTN: elevated in clinic today  - he is on multiple agents. Took AM meds but no afternoon meds yet - continue current regimen and will increase Cardura to 2 mg daily  6. "Falling out" spells:  - Resolved after starting CPAP.   - Zio Patch  Showed no arrhythmias  7. Obesity Body mass index is 51.54 kg/m.  - has lost ~70 lb through wellness program. - congratulated on efforts and  encouraged to continue on current plan.   F/u w/ PharmD in 3 weeks for further med titration for HTN. Follow up in 3 months w/ APP or Dr. Aundra Dubin. Continue HF Paramedicine.   Lyda Jester PA-C 07/02/2020 1:38 PM

## 2020-07-03 ENCOUNTER — Ambulatory Visit (HOSPITAL_COMMUNITY)
Admission: RE | Admit: 2020-07-03 | Discharge: 2020-07-03 | Disposition: A | Discharge status: Home / Self Care | Payer: Medicaid Other | Source: Ambulatory Visit | Attending: Internal Medicine | Admitting: Internal Medicine

## 2020-07-03 DIAGNOSIS — I5032 Chronic diastolic (congestive) heart failure: Secondary | ICD-10-CM | POA: Diagnosis not present

## 2020-07-03 LAB — BASIC METABOLIC PANEL
Anion gap: 9 (ref 5–15)
BUN: 18 mg/dL (ref 6–20)
CO2: 32 mmol/L (ref 22–32)
Calcium: 9.2 mg/dL (ref 8.9–10.3)
Chloride: 95 mmol/L — ABNORMAL LOW (ref 98–111)
Creatinine, Ser: 1.21 mg/dL (ref 0.61–1.24)
GFR, Estimated: >60 mL/min (ref >60)
Glucose, Bld: 101 mg/dL — ABNORMAL HIGH (ref 70–99)
Potassium: 3.1 mmol/L — ABNORMAL LOW (ref 3.5–5.1)
Sodium: 136 mmol/L (ref 135–145)

## 2020-07-06 ENCOUNTER — Telehealth (HOSPITAL_COMMUNITY): Payer: Self-pay | Admitting: Licensed Clinical Social Worker

## 2020-07-06 ENCOUNTER — Telehealth: Payer: Self-pay | Admitting: Pulmonary Disease

## 2020-07-06 NOTE — Telephone Encounter (Signed)
Spoke with [place] and notified of response per Dr Craige Cotta  Nothing further needed

## 2020-07-06 NOTE — Telephone Encounter (Signed)
CSW informed by [employer] that pt feeling very poorly this morning from not being able to use his CPAP which has been recalled.  [facility] was working on referral for new CPAP through pt Medicaid but pt has not heard anything regarding new CPAP.  CSW called [facility] and spoke with RN regarding pt current concerns and status of CPAP referral- she is sending message to get status update of CPAP referral- they will call CSW back with status update.  CSW will continue to follow and assist as needed  [name], LCSW Clinical Social Worker [facility] Desk#: [phone] Cell#: [phone]

## 2020-07-06 NOTE — Telephone Encounter (Signed)
Spoke with Eileen Stanford, pt's social worker  She states pt still not received his new CPAP  I advised we sent order to [facility] for this on 06/12/20 and they received the order  She will call to check status of order  She states pt has not been doing well without CPAP and is having HA and fatigue   She is asking how dangerous it would be if pt uses his recalled machine while waiting for new one to come  Please advise thanks

## 2020-07-06 NOTE — Telephone Encounter (Signed)
Benjamin [name] is returning phone call. [place] phone number is [phone].

## 2020-07-06 NOTE — Telephone Encounter (Signed)
Since he is more symptomatic w/o CPAP use, it should be okay to use his old CPAP for the short term until [facility] provides him with a new CPAP machine.

## 2020-07-06 NOTE — Telephone Encounter (Signed)
LMTCB for [employer]

## 2020-07-06 NOTE — Telephone Encounter (Addendum)
CSW received call back from [facility] who states that they show referral was received by [facility] on 9/27.  CSW explained pt current SOB, having headaches, and very fatigued.  Inquired if they could ask physician if he should continue to use recalled CPAP while awaiting new one due to his concerning symptoms.  RN spoke with physician who advises he should continue to use the CPAP until his new one arrives since he is feeling so poorly off of it.  CSW called and spoke with [facility] representative, Verlon Au, who states that they have referral but due to supply chain issues they have not gotten any new CPAPs in 3 weeks.  CSW explained pt health concerns and Verlon Au stated that they could place patient on "Priority" list which I requested.  They have no estimate of how long getting new CPAP will take at this time  Will continue to follow and assist as needed.  [name], LCSW Clinical Social Worker [facility] Desk#: [phone] Cell#: [phone]

## 2020-07-11 DIAGNOSIS — J9611 Chronic respiratory failure with hypoxia: Secondary | ICD-10-CM | POA: Diagnosis not present

## 2020-07-11 DIAGNOSIS — I5032 Chronic diastolic (congestive) heart failure: Secondary | ICD-10-CM | POA: Diagnosis not present

## 2020-07-13 ENCOUNTER — Other Ambulatory Visit (HOSPITAL_COMMUNITY): Payer: Self-pay

## 2020-07-13 NOTE — Progress Notes (Signed)
Paramedicine Encounter    Patient ID: Benjamin [name], male    DOB: [birth year], 54 y.o.   MRN: [id number]   Patient Care Team: [name], NP as PCP - General (Nurse Practitioner) [name], MD as PCP - Cardiology (Cardiology) [name], LCSW as Social Worker (Licensed Clinical Social Worker)  Patient Active Problem List   Diagnosis Date Noted  . Obesity hypoventilation syndrome ([place]) 06/12/2020  . Healthcare maintenance 06/12/2020  . Acute on chronic diastolic heart failure ([place]) 02/24/2020  . Diabetic neuropathy ([place]) 11/20/2019  . Coagulation disorder ([place]) 11/20/2019  . Acute on chronic diastolic CHF (congestive heart failure), NYHA class 3 ([place]) 07/30/2019  . Diabetes mellitus without complication ([place]) 50/05/3817  . Hypertensive emergency   . BRBPR (bright red blood per rectum) 06/12/2019  . Hypertension   . CHF (congestive heart failure) ([place])   . Sleep apnea   . Chronic respiratory failure ([place])   . Hypokalemia 05/29/2019  . GI bleed 05/29/2019  . Anemia 05/28/2019  . Acute on chronic diastolic CHF (congestive heart failure) ([place]) 04/11/2019  . Iron deficiency anemia 03/11/2019  . OSA on CPAP 03/11/2019  . HLD (hyperlipidemia) 03/11/2019  . Elevated troponin 03/11/2019  . GERD (gastroesophageal reflux disease) 03/11/2019  . Rectal bleeding 02/20/2019  . Dyspnea 02/06/2019  . COVID-19 virus infection 02/06/2019  . Bilateral lower extremity edema   . Morbid obesity with BMI of 50.0-59.9, adult ([place])   . Chronic diastolic CHF (congestive heart failure) ([place])   . PAF (paroxysmal atrial fibrillation) ([place])   . PE (pulmonary thromboembolism) ([place]) 01/21/2019  . Hypertensive urgency 01/21/2019  . Diabetes mellitus type 2 in obese ([place]) 01/21/2019    Current Outpatient Medications:  .  amLODipine (NORVASC) 10 MG tablet, TAKE ONE TABLET BY MOUTH ONCE DAILY, Disp: 30 tablet, Rfl: 2 .  atorvastatin (LIPITOR) 40 MG tablet, Take 1 tablet (40 mg total) by  mouth at bedtime., Disp: 90 tablet, Rfl: 2 .  Blood Glucose Monitoring Suppl (TRUE METRIX METER) w/Device KIT, Use as instructed. Check blood glucose level by fingerstick twice per day. (Patient taking differently: 1 each by Other route See admin instructions. Use as instructed. Check blood glucose level by fingerstick twice per day.), Disp: 1 kit, Rfl: 0 .  Blood Pressure Monitor DEVI, Please provide patient with insurance approved blood pressure device with L-XL cuff. BMI 55 (Patient taking differently: 1 each by Other route See admin instructions. Please provide patient with insurance approved blood pressure device with L-XL cuff. BMI 55), Disp: 1 each, Rfl: 0 .  carboxymethylcellulose (REFRESH PLUS) 0.5 % SOLN, Place 1 drop into both eyes daily. , Disp: , Rfl:  .  docusate sodium (COLACE) 100 MG capsule, Take 1 capsule (100 mg total) by mouth 2 (two) times daily., Disp: 180 capsule, Rfl: 2 .  doxazosin (CARDURA) 2 MG tablet, Take 1 tablet (2 mg total) by mouth daily., Disp: 30 tablet, Rfl: 11 .  ferrous sulfate 325 (65 FE) MG tablet, Take 1 tablet (325 mg total) by mouth daily., Disp: 90 tablet, Rfl: 2 .  glucose blood (TRUE METRIX BLOOD GLUCOSE TEST) test strip, Use as instructed. Check blood glucose level by fingerstick twice per day. (Patient taking differently: 1 each by Other route See admin instructions. Use as instructed. Check blood glucose level by fingerstick twice per day.), Disp: 100 each, Rfl: 12 .  labetalol (NORMODYNE) 200 MG tablet, TAKE 2 TABLETS (400 MG TOTAL) BY MOUTH 2 (TWO) TIMES DAILY., Disp: 120  tablet, Rfl: 3 .  losartan (COZAAR) 100 MG tablet, Take 1 tablet (100 mg total) by mouth daily., Disp: 30 tablet, Rfl: 11 .  magnesium oxide (MAG-OX) 400 (241.3 Mg) MG tablet, TAKE 2 TABLETS BY MOUTH EVERY MORNING AND 1 TABLET EVERY EVENING (AM+NOON+BEDTIME), Disp: 90 tablet, Rfl: 3 .  metFORMIN (GLUCOPHAGE) 500 MG tablet, TAKE ONE TABLET BY MOUTH TWICE DAILY WITH A MEAL (NOON+BEDTIME),  Disp: 180 tablet, Rfl: 0 .  methocarbamol (ROBAXIN) 500 MG tablet, Take 1 tablet (500 mg total) by mouth every 8 (eight) hours as needed for muscle spasms., Disp: 15 tablet, Rfl: 1 .  metolazone (ZAROXOLYN) 5 MG tablet, Take 1 tablet (5 mg total) by mouth See admin instructions. Take 1 tablet every Monday, Wednesday, and Friday., Disp: 15 tablet, Rfl: 3 .  Misc. Devices MISC, Requires O2 @ 3L/min continuously via nasal canula and home fill system,. (Patient taking differently: 1 each by Other route See admin instructions. Requires O2 @ 3L/min continuously via nasal canula and home fill system,.), Disp: 1 each, Rfl: 0 .  Misc. Devices MISC, Please provide Mr. Tully with insurance approved portable O2 concentrator ICD 10 J96.11 [phone] (Patient taking differently: 1 each by Other route See admin instructions. Please provide Mr. Chiu with insurance approved portable O2 concentrator ICD 10 J96.11 Z99.81), Disp: 1 each, Rfl: 0 .  Misc. Devices MISC, Please provide patient with XL sized insurance approved compression stockings. ICD 10 R60.0 and  I50.32, Disp: 1 each, Rfl: 0 .  omeprazole (PRILOSEC) 20 MG capsule, TAKE 1 CAPSULE (20 MG TOTAL) BY MOUTH 2 (TWO) TIMES DAILY BEFORE A MEAL. TAKE FIRST CAPSULE 30 MIN PRIOR TO EATING OR TAKING OTHER MEDICATIONS., Disp: 60 capsule, Rfl: 2 .  potassium chloride (KLOR-CON) 10 MEQ tablet, TAKE 60 MEQ (6 TABS) IN THE MORNING, 60 MEQ (6 TABS) IN THE AFTERNOON AND 40 MEQ (4 TABS) IN THE EVENING. TAKE AN EXTRA 4 TABS EVERY DOSE OF METOLAZONE, Disp: 540 tablet, Rfl: 3 .  PROVENTIL HFA 108 (90 Base) MCG/ACT inhaler, INHALE 2 PUFFS BY MOUTH EVERY 6 (SIX) HOURS AS NEEDED FOR WHEEZING OR SHORTNESS OF BREATH., Disp: 6.7 g, Rfl: 2 .  rivaroxaban (XARELTO) 20 MG TABS tablet, Take 1 tablet (20 mg total) by mouth daily with supper., Disp: 60 tablet, Rfl: 6 .  spironolactone (ALDACTONE) 50 MG tablet, TAKE 1 TABLET (50 MG TOTAL) BY MOUTH DAILY., Disp: 30 tablet, Rfl: 3 .  torsemide  (DEMADEX) 100 MG tablet, TAKE ONE TABLET BY MOUTH TWICE DAILY (NOON+BEDTIME), Disp: 180 tablet, Rfl: 3 .  TRUEplus Lancets 28G MISC, Use as instructed. Check blood glucose level by fingerstick twice per day. (Patient taking differently: 1 each by Other route See admin instructions. Use as instructed. Check blood glucose level by fingerstick twice per day.), Disp: 200 each, Rfl: 3 No Known Allergies    Social History   Socioeconomic History  . Marital status: Single    Spouse name: Not on file  . Number of children: Not on file  . Years of education: Not on file  . Highest education level: Not on file  Occupational History  . Not on file  Tobacco Use  . Smoking status: Former Research scientist (life sciences)  . Smokeless tobacco: Never Used  Vaping Use  . Vaping Use: Never used  Substance and Sexual Activity  . Alcohol use: Not Currently  . Drug use: Not Currently  . Sexual activity: Not Currently  Other Topics Concern  . Not on file  Social History Narrative  .  Not on file   Social Determinants of Health   Financial Resource Strain: Medium Risk  . Difficulty of Paying Living Expenses: Somewhat hard  Food Insecurity: Food Insecurity Present  . Worried About Charity fundraiser in the Last Year: Sometimes true  . Ran Out of Food in the Last Year: Sometimes true  Transportation Needs: No Transportation Needs  . Lack of Transportation (Medical): No  . Lack of Transportation (Non-Medical): No  Physical Activity:   . Days of Exercise per Week: Not on file  . Minutes of Exercise per Session: Not on file  Stress:   . Feeling of Stress : Not on file  Social Connections:   . Frequency of Communication with Friends and Family: Not on file  . Frequency of Social Gatherings with Friends and Family: Not on file  . Attends Religious Services: Not on file  . Active Member of Clubs or Organizations: Not on file  . Attends Archivist Meetings: Not on file  . Marital Status: Not on file  Intimate  Partner Violence:   . Fear of Current or Ex-Partner: Not on file  . Emotionally Abused: Not on file  . Physically Abused: Not on file  . Sexually Abused: Not on file    Physical Exam      Future Appointments  Date Time Provider [place]  07/15/2020  9:30 AM [name], NP [facility] None  07/22/2020 12:00 PM [facility] PHARMACY [facility] None  08/04/2020 12:00 PM [name], MD [facility] None  09/21/2020  1:30 PM [facility] PA/NP [facility] None    BP (!) 118/0   Pulse 92   Resp 20   SpO2 98%   Weight yesterday-357 Last visit weight-359 @ clinic   Pt reports he is doing good. He denies increased sob. Pt is now using his oxygen more instead of the CPAP-he got clearance from his pulmonary doc that risk vs benefit from the recalled device is he should use it due to without it was causing so many breathing problems/cp however pt does not feel comfortable using it. He called [employer] and they told him he was high on the list and as soon as it becomes available he will be sent one. No edema noted. Pt no longer feeling those c/p and palpitations. meds verified-refilled his pill box of potassium-his other meds are in bubble packs. He did miss a couple days of his potassium-he said he was getting tired of taking them. Advised him of the importance of taking his meds. He is aware. B/p is doing much better!   [name], [facility] Paramedic  07/13/20

## 2020-07-15 ENCOUNTER — Ambulatory Visit: Admitting: Nurse Practitioner

## 2020-07-20 ENCOUNTER — Telehealth (HOSPITAL_COMMUNITY): Payer: Self-pay | Admitting: Licensed Clinical Social Worker

## 2020-07-20 ENCOUNTER — Telehealth (HOSPITAL_COMMUNITY): Payer: Self-pay

## 2020-07-20 NOTE — Telephone Encounter (Signed)
Pt called me to report that he hasnt been taking any of his meds this whole week due to stress in the residence.  He said he would get back on track today and resume meds.  See jennas note-he came to see her today in clinic and spoke to her at length what has been going. Will see him next week-he is advised to call me should he start feeling bad or anything else.   Kerry Hough, EMT-Paramedic  07/20/20

## 2020-07-20 NOTE — Telephone Encounter (Addendum)
CSW received call from pt stating he was at the clinic and wanting to see CSW to talk about current stressors.  Pt expresses being very overwhelmed by lack of privacy/perceived freedom in his current living situation and is having a lot of negative thoughts and feelings leading to him slipping with drug and alcohol abuse.  CSW discussed this feeling with pt for over an hour and their relationship to his moms death over the summer which patient contributes his downward spiral to.  CSW called [place] apt liason to check pt place on waitlist- pt currently number 2 which gives pt a lot of hope for leaving his current situation soon.  CSW also discussed that we could look for other options but that might mean him still living with other people as he has very limited budget.  Pt also discussed a big part of his stress is inability to sleep.  Pt has been unable to use CPAP due to recall and even after his Pulmonologist cleared him to continue using due to negative effects of continued disuse he has been unable to get a mask.  CSW will assist pt in figuring out next steps with this so he can sleep in the evenings.  Other big factor is for pt to get mental health services regarding his mothers loss.  CSW and pt called [facility] for bereavement counseling- appt set for Nov. 9th at 11am- pt aware.  Pt also brought food stamp renewal application with him- CSW assisted in completing and faxed in for review- fax confirmation received  CSW discussed coping mechanisms with pt and suggested breathing exercises when he starts having negative thoughts as well as continuing healthy coping behaviors he is already utilizing such as leaving the situation, taking walks, reaching out to talk.  Will continue to follow and assist as needed- planning to meet with pt Wednesday after he goes to [facility] for rehab.  [name], LCSW Clinical Social Worker [facility] Desk#:  [phone] Cell#: [phone]

## 2020-07-21 ENCOUNTER — Telehealth (HOSPITAL_COMMUNITY): Payer: Self-pay | Admitting: Licensed Clinical Social Worker

## 2020-07-21 NOTE — Telephone Encounter (Signed)
CSW called [facility] this morning to discuss pt CPAP status or see if we could obtain pt just a CPAP mask to use with his current CPAP while we await new CPAP to be delivered.  [facility] states they just got new shipment in and states because of pt having issues currently without CPAP they can get him in as early as Friday to get set up.  CSW able to confirm appt for 3pm Friday and set up [facility] for patient- patient provided [facility] number to call when he is done.  Pt also reports feeling much better today emotionally after our talk yesterday and got a good nights sleep.  Pt is planning to return to talk further with CSW tomorrow and continue working through his current stressors.  Will continue to follow and assist as needed  [name], LCSW Clinical Social Worker [facility] Desk#: [phone] Cell#: [phone]

## 2020-07-22 ENCOUNTER — Inpatient Hospital Stay (HOSPITAL_COMMUNITY): Admission: RE | Admit: 2020-07-22 | Source: Ambulatory Visit

## 2020-07-24 DIAGNOSIS — G4733 Obstructive sleep apnea (adult) (pediatric): Secondary | ICD-10-CM | POA: Diagnosis not present

## 2020-07-27 ENCOUNTER — Telehealth (HOSPITAL_COMMUNITY): Payer: Self-pay | Admitting: Licensed Clinical Social Worker

## 2020-07-27 NOTE — Telephone Encounter (Signed)
CSW called pt to check in after weekend with new CPAP and to remind of grief counseling appt tomorrow with [facility] counselor.  Pt reports he has been sleeping all weekend now that he has his CPAP back- reports he went to sleep last night around 8pm and didn't wake up until about 2:30pm today.  States he feels so much better now that he is able to sleep.  CSW informed of appt tomorrow at 11am with grief counselor- encouraged pt to set alarms to ensure he didn't oversleep for appt- pt will plan to make sure he is up.  Will continue to follow and assist as needed  [name], LCSW Clinical Social Worker [facility] Desk#: [phone] Cell#: [phone]

## 2020-07-29 ENCOUNTER — Other Ambulatory Visit (HOSPITAL_COMMUNITY): Payer: Self-pay

## 2020-07-29 NOTE — Progress Notes (Signed)
Paramedicine Encounter    Patient ID: Benjamin [name], male    DOB: [birth year], 54 y.o.   MRN: [id number]   Patient Care Team: [name], NP as PCP - General (Nurse Practitioner) [name], MD as PCP - Cardiology (Cardiology) [name], LCSW as Social Worker (Licensed Clinical Social Worker)  Patient Active Problem List   Diagnosis Date Noted  . Obesity hypoventilation syndrome ([place]) 06/12/2020  . Healthcare maintenance 06/12/2020  . Acute on chronic diastolic heart failure ([place]) 02/24/2020  . Diabetic neuropathy ([place]) 11/20/2019  . Coagulation disorder ([place]) 11/20/2019  . Acute on chronic diastolic CHF (congestive heart failure), NYHA class 3 ([place]) 07/30/2019  . Diabetes mellitus without complication ([place]) 50/05/3817  . Hypertensive emergency   . BRBPR (bright red blood per rectum) 06/12/2019  . Hypertension   . CHF (congestive heart failure) ([place])   . Sleep apnea   . Chronic respiratory failure ([place])   . Hypokalemia 05/29/2019  . GI bleed 05/29/2019  . Anemia 05/28/2019  . Acute on chronic diastolic CHF (congestive heart failure) ([place]) 04/11/2019  . Iron deficiency anemia 03/11/2019  . OSA on CPAP 03/11/2019  . HLD (hyperlipidemia) 03/11/2019  . Elevated troponin 03/11/2019  . GERD (gastroesophageal reflux disease) 03/11/2019  . Rectal bleeding 02/20/2019  . Dyspnea 02/06/2019  . COVID-19 virus infection 02/06/2019  . Bilateral lower extremity edema   . Morbid obesity with BMI of 50.0-59.9, adult ([place])   . Chronic diastolic CHF (congestive heart failure) ([place])   . PAF (paroxysmal atrial fibrillation) ([place])   . PE (pulmonary thromboembolism) ([place]) 01/21/2019  . Hypertensive urgency 01/21/2019  . Diabetes mellitus type 2 in obese ([place]) 01/21/2019    Current Outpatient Medications:  .  amLODipine (NORVASC) 10 MG tablet, TAKE ONE TABLET BY MOUTH ONCE DAILY, Disp: 30 tablet, Rfl: 2 .  atorvastatin (LIPITOR) 40 MG tablet, Take 1 tablet (40 mg total) by  mouth at bedtime., Disp: 90 tablet, Rfl: 2 .  Blood Glucose Monitoring Suppl (TRUE METRIX METER) w/Device KIT, Use as instructed. Check blood glucose level by fingerstick twice per day. (Patient taking differently: 1 each by Other route See admin instructions. Use as instructed. Check blood glucose level by fingerstick twice per day.), Disp: 1 kit, Rfl: 0 .  Blood Pressure Monitor DEVI, Please provide patient with insurance approved blood pressure device with L-XL cuff. BMI 55 (Patient taking differently: 1 each by Other route See admin instructions. Please provide patient with insurance approved blood pressure device with L-XL cuff. BMI 55), Disp: 1 each, Rfl: 0 .  carboxymethylcellulose (REFRESH PLUS) 0.5 % SOLN, Place 1 drop into both eyes daily. , Disp: , Rfl:  .  docusate sodium (COLACE) 100 MG capsule, Take 1 capsule (100 mg total) by mouth 2 (two) times daily., Disp: 180 capsule, Rfl: 2 .  doxazosin (CARDURA) 2 MG tablet, Take 1 tablet (2 mg total) by mouth daily., Disp: 30 tablet, Rfl: 11 .  ferrous sulfate 325 (65 FE) MG tablet, Take 1 tablet (325 mg total) by mouth daily., Disp: 90 tablet, Rfl: 2 .  glucose blood (TRUE METRIX BLOOD GLUCOSE TEST) test strip, Use as instructed. Check blood glucose level by fingerstick twice per day. (Patient taking differently: 1 each by Other route See admin instructions. Use as instructed. Check blood glucose level by fingerstick twice per day.), Disp: 100 each, Rfl: 12 .  labetalol (NORMODYNE) 200 MG tablet, TAKE 2 TABLETS (400 MG TOTAL) BY MOUTH 2 (TWO) TIMES DAILY., Disp: 120  tablet, Rfl: 3 .  losartan (COZAAR) 100 MG tablet, Take 1 tablet (100 mg total) by mouth daily., Disp: 30 tablet, Rfl: 11 .  magnesium oxide (MAG-OX) 400 (241.3 Mg) MG tablet, TAKE 2 TABLETS BY MOUTH EVERY MORNING AND 1 TABLET EVERY EVENING (AM+NOON+BEDTIME), Disp: 90 tablet, Rfl: 3 .  metFORMIN (GLUCOPHAGE) 500 MG tablet, TAKE ONE TABLET BY MOUTH TWICE DAILY WITH A MEAL (NOON+BEDTIME),  Disp: 180 tablet, Rfl: 0 .  methocarbamol (ROBAXIN) 500 MG tablet, Take 1 tablet (500 mg total) by mouth every 8 (eight) hours as needed for muscle spasms., Disp: 15 tablet, Rfl: 1 .  metolazone (ZAROXOLYN) 5 MG tablet, Take 1 tablet (5 mg total) by mouth See admin instructions. Take 1 tablet every Monday, Wednesday, and Friday., Disp: 15 tablet, Rfl: 3 .  Misc. Devices MISC, Requires O2 @ 3L/min continuously via nasal canula and home fill system,. (Patient taking differently: 1 each by Other route See admin instructions. Requires O2 @ 3L/min continuously via nasal canula and home fill system,.), Disp: 1 each, Rfl: 0 .  Misc. Devices MISC, Please provide Mr. Tornow with insurance approved portable O2 concentrator ICD 10 J96.11 [phone] (Patient taking differently: 1 each by Other route See admin instructions. Please provide Mr. Teters with insurance approved portable O2 concentrator ICD 10 J96.11 Z99.81), Disp: 1 each, Rfl: 0 .  Misc. Devices MISC, Please provide patient with XL sized insurance approved compression stockings. ICD 10 R60.0 and  I50.32, Disp: 1 each, Rfl: 0 .  omeprazole (PRILOSEC) 20 MG capsule, TAKE 1 CAPSULE (20 MG TOTAL) BY MOUTH 2 (TWO) TIMES DAILY BEFORE A MEAL. TAKE FIRST CAPSULE 30 MIN PRIOR TO EATING OR TAKING OTHER MEDICATIONS., Disp: 60 capsule, Rfl: 2 .  potassium chloride (KLOR-CON) 10 MEQ tablet, TAKE 60 MEQ (6 TABS) IN THE MORNING, 60 MEQ (6 TABS) IN THE AFTERNOON AND 40 MEQ (4 TABS) IN THE EVENING. TAKE AN EXTRA 4 TABS EVERY DOSE OF METOLAZONE, Disp: 540 tablet, Rfl: 3 .  PROVENTIL HFA 108 (90 Base) MCG/ACT inhaler, INHALE 2 PUFFS BY MOUTH EVERY 6 (SIX) HOURS AS NEEDED FOR WHEEZING OR SHORTNESS OF BREATH., Disp: 6.7 g, Rfl: 2 .  spironolactone (ALDACTONE) 50 MG tablet, TAKE 1 TABLET (50 MG TOTAL) BY MOUTH DAILY., Disp: 30 tablet, Rfl: 3 .  torsemide (DEMADEX) 100 MG tablet, TAKE ONE TABLET BY MOUTH TWICE DAILY (NOON+BEDTIME), Disp: 180 tablet, Rfl: 3 .  TRUEplus Lancets  28G MISC, Use as instructed. Check blood glucose level by fingerstick twice per day. (Patient taking differently: 1 each by Other route See admin instructions. Use as instructed. Check blood glucose level by fingerstick twice per day.), Disp: 200 each, Rfl: 3 .  XARELTO 20 MG TABS tablet, TAKE 1 TABLET BY MOUTH DAILY WITH SUPPER (BEDTIME), Disp: 60 tablet, Rfl: 6 No Known Allergies    Social History   Socioeconomic History  . Marital status: Single    Spouse name: Not on file  . Number of children: Not on file  . Years of education: Not on file  . Highest education level: Not on file  Occupational History  . Not on file  Tobacco Use  . Smoking status: Former Research scientist (life sciences)  . Smokeless tobacco: Never Used  Vaping Use  . Vaping Use: Never used  Substance and Sexual Activity  . Alcohol use: Not Currently  . Drug use: Not Currently  . Sexual activity: Not Currently  Other Topics Concern  . Not on file  Social History Narrative  . Not  on file   Social Determinants of Health   Financial Resource Strain: Medium Risk  . Difficulty of Paying Living Expenses: Somewhat hard  Food Insecurity: Food Insecurity Present  . Worried About Charity fundraiser in the Last Year: Sometimes true  . Ran Out of Food in the Last Year: Sometimes true  Transportation Needs: No Transportation Needs  . Lack of Transportation (Medical): No  . Lack of Transportation (Non-Medical): No  Physical Activity:   . Days of Exercise per Week: Not on file  . Minutes of Exercise per Session: Not on file  Stress:   . Feeling of Stress : Not on file  Social Connections:   . Frequency of Communication with Friends and Family: Not on file  . Frequency of Social Gatherings with Friends and Family: Not on file  . Attends Religious Services: Not on file  . Active Member of Clubs or Organizations: Not on file  . Attends Archivist Meetings: Not on file  . Marital Status: Not on file  Intimate Partner Violence:    . Fear of Current or Ex-Partner: Not on file  . Emotionally Abused: Not on file  . Physically Abused: Not on file  . Sexually Abused: Not on file    Physical Exam      Future Appointments  Date Time Provider [place]  08/04/2020 12:00 PM [name], MD [facility] None  09/21/2020  1:30 PM [facility] PA/NP [facility] None    BP (!) 154/82   Pulse 88   Resp 20   Wt (!) 353 lb (160.1 kg)   SpO2 93%   BMI 50.65 kg/m   Weight yesterday-? Last visit weight-357  Pt reports feeling ok. He is stressed again with no heat in the house, no water for several days and he didn't have any power for a few days and then he noticed that the breaker in the back of house had been turned off.  He is next in line for [employer]. He is very excited about that.  He denies increased sob. He states no dizziness, no c/p. He is doing much better about taking his meds again.  No edema noted. meds verified in bubble pack and filled his potassium pill box.    [name], [facility] Paramedic  07/29/20

## 2020-07-31 ENCOUNTER — Telehealth (HOSPITAL_COMMUNITY): Payer: Self-pay | Admitting: Licensed Clinical Social Worker

## 2020-07-31 NOTE — Telephone Encounter (Signed)
CSW filled out patient recertification for food stamp and sent in lease to provide proof of rent.  Documents are due today in order to prevent a stop in benefits so CSW called [employer] and left message for case worker Gardiner [phone]- requesting return call to confirm pt has turned in everything he needs turned in- awaiting return call  Will continue to follow and assist as needed  [name], LCSW Clinical Social Worker [facility] Desk#: [phone] Cell#: [phone]

## 2020-08-02 DIAGNOSIS — J9611 Chronic respiratory failure with hypoxia: Secondary | ICD-10-CM | POA: Diagnosis not present

## 2020-08-02 DIAGNOSIS — I5032 Chronic diastolic (congestive) heart failure: Secondary | ICD-10-CM | POA: Diagnosis not present

## 2020-08-03 ENCOUNTER — Telehealth (HOSPITAL_COMMUNITY): Payer: Self-pay | Admitting: Licensed Clinical Social Worker

## 2020-08-03 NOTE — Telephone Encounter (Signed)
CSW received call from pt informing that his food stamp case worker reported not having received updated paperwork.  CSW called Gardiner Coins with [facility] and was able to confirm she did not receive the fax from Thursday despite fax confirmation.  CSW scanned and emailed paperwork directly to Ms Montez Morita who confirmed their receipt- does not anticipate any delays in patients benefits  Will continue to follow and assist as needed  [name], LCSW Clinical Social Worker [facility] Desk#: [phone] Cell#: [phone]

## 2020-08-04 ENCOUNTER — Ambulatory Visit (INDEPENDENT_AMBULATORY_CARE_PROVIDER_SITE_OTHER): Payer: Medicaid Other | Admitting: Pulmonary Disease

## 2020-08-04 ENCOUNTER — Encounter: Payer: Self-pay | Admitting: Pulmonary Disease

## 2020-08-04 ENCOUNTER — Other Ambulatory Visit: Payer: Self-pay

## 2020-08-04 VITALS — BP 142/84 | HR 79 | Temp 98.6°F | Ht 70.0 in | Wt 362.6 lb

## 2020-08-04 DIAGNOSIS — J9611 Chronic respiratory failure with hypoxia: Secondary | ICD-10-CM | POA: Diagnosis not present

## 2020-08-04 DIAGNOSIS — E662 Morbid (severe) obesity with alveolar hypoventilation: Secondary | ICD-10-CM

## 2020-08-04 DIAGNOSIS — Z9989 Dependence on other enabling machines and devices: Secondary | ICD-10-CM | POA: Diagnosis not present

## 2020-08-04 DIAGNOSIS — G4733 Obstructive sleep apnea (adult) (pediatric): Secondary | ICD-10-CM

## 2020-08-04 NOTE — Progress Notes (Signed)
[facility]  Chief Complaint  Patient presents with  . Follow-up    shortness of breath with activity    Constitutional:  BP (!) 142/84 (BP Location: Right Wrist, Cuff Size: Normal)   Pulse 79   Temp 98.6 F (37 C) (Other (Comment)) Comment (Src): wrist  Ht 5' 10" (1.778 m)   Wt (!) 362 lb 9.6 oz (164.5 kg)   SpO2 98% Comment: 2L O2  BMI 52.03 kg/m   Past Medical History:  A fib, Anemia, Diastolic CHF, DM, HTN, PE, Rectal bleeding  Past Surgical History:  His  has a past surgical history that includes No past surgeries; Esophagogastroduodenoscopy (egd) with propofol (Left, 02/25/2019); Colonoscopy with propofol (Left, 02/25/2019); RIGHT HEART CATH (N/A, 04/17/2019); Esophagogastroduodenoscopy (egd) with propofol (N/A, 06/12/2019); Flexible sigmoidoscopy (N/A, 06/12/2019); and Hemorrhoid banding (05/2019).  Brief Summary:  Benjamin [name] is a 54 y.o. male with chronic hypoxic/hypercapnic respiratory failure from obstructive sleep apnea and obesity hypoventilation syndrome.      Subjective:   He has CPAP and uses 2 liters oxygen.  He has nasal mask.  His power was out and he couldn't use CPAP.  He know has power back and plans to start using CPAP tonight.  He plans to get flu shot later this month when he sees his PCP.  He also is planning to get COVID booster.  Physical Exam:   Appearance - well kempt, wearing oxygen  ENMT - no sinus tenderness, no oral exudate, no LAN, Mallampati 4 airway, no stridor  Respiratory - equal breath sounds bilaterally, no wheezing or rales  CV - s1s2 regular rate and rhythm, no murmurs  Ext - no clubbing, no edema  Skin - no rashes  Psych - normal mood and affect   Pulmonary testing:   ABG RA 04/19/19 >> pH 7.44, PCO2 49.5, PO2 59.9  Chest Imaging:   CT angio chest 01/21/19 >> b/l lower lobe PE  CT angio chest 02/26/19 >> no PE  Sleep Tests:   PSG 05/17/19 >>AHI 128.1, SpO2 low 51%.  CPAP 17 cm H2O with 2 liters oxygen.  Cardiac Tests:   Echo 01/22/19 >> EF 60 to 65%, mild LVH  RHC 04/17/19 >> pulmonary venous hypertension  Echo 07/17/19 >> EF 65 to 70%, mild LVH, mild LA dilation  Social History:  He  reports that he has quit smoking. He has never used smokeless tobacco. He reports previous alcohol use. He reports previous drug use. Drug: Marijuana.  Family History:  His family history includes Diabetes in his mother; Hypertension in his mother.     Assessment/Plan:   Obstructive sleep apnea. - he reports benefit from CPAP therapy - he uses [employer] for his DME - continue CPAP 17 cm H2O  Chronic hypoxic/hypercapnic respiratory failure from obesity hypoventilation syndrome. - goal SpO2 > 90% - continue 2 to liters oxygen 24/7  - provided him with information to contact [employer] so he can be put on list to have power cut on sooner since he is on life sustaining medical equipment  Morbid obesity. - he understands importance of weight loss  COVID 19 advice. - advised him to proceed with plan to get COVID vaccine booster  Chronic diastolic CHF, Paroxysmal atrial fibrillation. - followed by Dr. Dorris Carnes and Dr. Loralie Champagne with [facility]  Time Spent Involved in Patient Care on Day of Examination:  21 minutes  Follow up:  Patient Instructions  Follow up in 6  months   Medication List:   Allergies as of 08/04/2020   No Known Allergies     Medication List       Accurate as of August 04, 2020 12:15 PM. If you have any questions, ask your nurse or doctor.        amLODipine 10 MG tablet Commonly known as: NORVASC TAKE ONE TABLET BY MOUTH ONCE DAILY   atorvastatin 40 MG tablet Commonly known as: LIPITOR Take 1 tablet (40 mg total) by mouth at bedtime.   Blood Pressure Monitor Devi Please provide patient with insurance approved blood pressure device with L-XL cuff. BMI 55 What changed:   how much to take  how to take  this  when to take this   carboxymethylcellulose 0.5 % Soln Commonly known as: REFRESH PLUS Place 1 drop into both eyes daily.   docusate sodium 100 MG capsule Commonly known as: COLACE Take 1 capsule (100 mg total) by mouth 2 (two) times daily.   doxazosin 2 MG tablet Commonly known as: Cardura Take 1 tablet (2 mg total) by mouth daily.   ferrous sulfate 325 (65 FE) MG tablet Take 1 tablet (325 mg total) by mouth daily.   labetalol 200 MG tablet Commonly known as: NORMODYNE TAKE 2 TABLETS (400 MG TOTAL) BY MOUTH 2 (TWO) TIMES DAILY.   losartan 100 MG tablet Commonly known as: COZAAR Take 1 tablet (100 mg total) by mouth daily.   magnesium oxide 400 (241.3 Mg) MG tablet Commonly known as: MAG-OX TAKE 2 TABLETS BY MOUTH EVERY MORNING AND 1 TABLET EVERY EVENING (AM+NOON+BEDTIME)   metFORMIN 500 MG tablet Commonly known as: GLUCOPHAGE TAKE ONE TABLET BY MOUTH TWICE DAILY WITH A MEAL (NOON+BEDTIME)   methocarbamol 500 MG tablet Commonly known as: Robaxin Take 1 tablet (500 mg total) by mouth every 8 (eight) hours as needed for muscle spasms.   metolazone 5 MG tablet Commonly known as: ZAROXOLYN Take 1 tablet (5 mg total) by mouth See admin instructions. Take 1 tablet every Monday, Wednesday, and Friday.   Misc. Devices Misc Requires O2 @ 3L/min continuously via nasal canula and home fill system,. What changed:   how much to take  how to take this  when to take this   Misc. Devices Misc Please provide Benjamin [name] with insurance approved portable O2 concentrator ICD 10 J96.11 Z99.81 What changed:   how much to take  how to take this  when to take this   Misc. Devices Misc Please provide patient with XL sized insurance approved compression stockings. ICD 10 R60.0 and  I50.32 What changed: Another medication with the same name was changed. Make sure you understand how and when to take each.   omeprazole 20 MG capsule Commonly known as: PRILOSEC TAKE 1  CAPSULE (20 MG TOTAL) BY MOUTH 2 (TWO) TIMES DAILY BEFORE A MEAL. TAKE FIRST CAPSULE 30 MIN PRIOR TO EATING OR TAKING OTHER MEDICATIONS.   potassium chloride 10 MEQ tablet Commonly known as: KLOR-CON TAKE 60 MEQ (6 TABS) IN THE MORNING, 60 MEQ (6 TABS) IN THE AFTERNOON AND 40 MEQ (4 TABS) IN THE EVENING. TAKE AN EXTRA 4 TABS EVERY DOSE OF METOLAZONE   Proventil HFA 108 (90 Base) MCG/ACT inhaler Generic drug: albuterol INHALE 2 PUFFS BY MOUTH EVERY 6 (SIX) HOURS AS NEEDED FOR WHEEZING OR SHORTNESS OF BREATH.   spironolactone 50 MG tablet Commonly known as: ALDACTONE TAKE 1 TABLET (50 MG TOTAL) BY MOUTH DAILY.   torsemide 100 MG tablet Commonly known as:  DEMADEX TAKE ONE TABLET BY MOUTH TWICE DAILY (NOON+BEDTIME)   True Metrix Blood Glucose Test test strip Generic drug: glucose blood Use as instructed. Check blood glucose level by fingerstick twice per day. What changed:   how much to take  how to take this  when to take this   True Metrix Meter w/Device Kit Use as instructed. Check blood glucose level by fingerstick twice per day. What changed:   how much to take  how to take this  when to take this   TRUEplus Lancets 28G Misc Use as instructed. Check blood glucose level by fingerstick twice per day. What changed:   how much to take  how to take this  when to take this   Xarelto 20 MG Tabs tablet Generic drug: rivaroxaban TAKE 1 TABLET BY MOUTH DAILY WITH SUPPER (BEDTIME)       Signature:  [name], MD [place] Pager - [phone] 08/04/2020, 12:15 PM

## 2020-08-04 NOTE — Patient Instructions (Signed)
Follow up in 6 months 

## 2020-08-05 ENCOUNTER — Telehealth (HOSPITAL_COMMUNITY): Payer: Self-pay | Admitting: Licensed Clinical Social Worker

## 2020-08-05 ENCOUNTER — Other Ambulatory Visit (HOSPITAL_COMMUNITY): Payer: Self-pay

## 2020-08-05 NOTE — Telephone Encounter (Signed)
CSW received call from pt who states he has no food at this time.  Electricity has been out at his house for 3 days so all of his food is now spoiled.  CSW providing pt with $25 [employer] gift card to get food today and referral to [facility] food [facility] so he can get larger amount of food when they are open again tomorrow and Friday.  Will continue to follow and assist as needed  [name], LCSW Clinical Social Worker [facility] Desk#: [phone] Cell#: [phone]

## 2020-08-05 NOTE — Progress Notes (Signed)
Paramedicine Encounter    Patient ID: Benjamin [name], male    DOB: [birth year], 54 y.o.   MRN: [id number]   Patient Care Team: [name], NP as PCP - General (Nurse Practitioner) [name], MD as PCP - Cardiology (Cardiology) [name], LCSW as Social Worker (Licensed Clinical Social Worker)  Patient Active Problem List   Diagnosis Date Noted  . Obesity hypoventilation syndrome ([place]) 06/12/2020  . Healthcare maintenance 06/12/2020  . Acute on chronic diastolic heart failure ([place]) 02/24/2020  . Diabetic neuropathy ([place]) 11/20/2019  . Coagulation disorder ([place]) 11/20/2019  . Acute on chronic diastolic CHF (congestive heart failure), NYHA class 3 ([place]) 07/30/2019  . Diabetes mellitus without complication ([place]) 50/05/3817  . Hypertensive emergency   . BRBPR (bright red blood per rectum) 06/12/2019  . Hypertension   . CHF (congestive heart failure) ([place])   . Sleep apnea   . Chronic respiratory failure ([place])   . Hypokalemia 05/29/2019  . GI bleed 05/29/2019  . Anemia 05/28/2019  . Acute on chronic diastolic CHF (congestive heart failure) ([place]) 04/11/2019  . Iron deficiency anemia 03/11/2019  . OSA on CPAP 03/11/2019  . HLD (hyperlipidemia) 03/11/2019  . Elevated troponin 03/11/2019  . GERD (gastroesophageal reflux disease) 03/11/2019  . Rectal bleeding 02/20/2019  . Dyspnea 02/06/2019  . COVID-19 virus infection 02/06/2019  . Bilateral lower extremity edema   . Morbid obesity with BMI of 50.0-59.9, adult ([place])   . Chronic diastolic CHF (congestive heart failure) ([place])   . PAF (paroxysmal atrial fibrillation) ([place])   . PE (pulmonary thromboembolism) ([place]) 01/21/2019  . Hypertensive urgency 01/21/2019  . Diabetes mellitus type 2 in obese ([place]) 01/21/2019    Current Outpatient Medications:  .  amLODipine (NORVASC) 10 MG tablet, TAKE ONE TABLET BY MOUTH ONCE DAILY, Disp: 30 tablet, Rfl: 2 .  atorvastatin (LIPITOR) 40 MG tablet, Take 1 tablet (40 mg total) by  mouth at bedtime., Disp: 90 tablet, Rfl: 2 .  Blood Glucose Monitoring Suppl (TRUE METRIX METER) w/Device KIT, Use as instructed. Check blood glucose level by fingerstick twice per day. (Patient taking differently: 1 each by Other route See admin instructions. Use as instructed. Check blood glucose level by fingerstick twice per day.), Disp: 1 kit, Rfl: 0 .  Blood Pressure Monitor DEVI, Please provide patient with insurance approved blood pressure device with L-XL cuff. BMI 55 (Patient taking differently: 1 each by Other route See admin instructions. Please provide patient with insurance approved blood pressure device with L-XL cuff. BMI 55), Disp: 1 each, Rfl: 0 .  carboxymethylcellulose (REFRESH PLUS) 0.5 % SOLN, Place 1 drop into both eyes daily. , Disp: , Rfl:  .  docusate sodium (COLACE) 100 MG capsule, Take 1 capsule (100 mg total) by mouth 2 (two) times daily., Disp: 180 capsule, Rfl: 2 .  doxazosin (CARDURA) 2 MG tablet, Take 1 tablet (2 mg total) by mouth daily., Disp: 30 tablet, Rfl: 11 .  ferrous sulfate 325 (65 FE) MG tablet, Take 1 tablet (325 mg total) by mouth daily., Disp: 90 tablet, Rfl: 2 .  glucose blood (TRUE METRIX BLOOD GLUCOSE TEST) test strip, Use as instructed. Check blood glucose level by fingerstick twice per day. (Patient taking differently: 1 each by Other route See admin instructions. Use as instructed. Check blood glucose level by fingerstick twice per day.), Disp: 100 each, Rfl: 12 .  labetalol (NORMODYNE) 200 MG tablet, TAKE 2 TABLETS (400 MG TOTAL) BY MOUTH 2 (TWO) TIMES DAILY., Disp: 120  tablet, Rfl: 3 .  losartan (COZAAR) 100 MG tablet, Take 1 tablet (100 mg total) by mouth daily., Disp: 30 tablet, Rfl: 11 .  magnesium oxide (MAG-OX) 400 (241.3 Mg) MG tablet, TAKE 2 TABLETS BY MOUTH EVERY MORNING AND 1 TABLET EVERY EVENING (AM+NOON+BEDTIME), Disp: 90 tablet, Rfl: 3 .  metFORMIN (GLUCOPHAGE) 500 MG tablet, TAKE ONE TABLET BY MOUTH TWICE DAILY WITH A MEAL (NOON+BEDTIME),  Disp: 180 tablet, Rfl: 0 .  methocarbamol (ROBAXIN) 500 MG tablet, Take 1 tablet (500 mg total) by mouth every 8 (eight) hours as needed for muscle spasms., Disp: 15 tablet, Rfl: 1 .  metolazone (ZAROXOLYN) 5 MG tablet, Take 1 tablet (5 mg total) by mouth See admin instructions. Take 1 tablet every Monday, Wednesday, and Friday., Disp: 15 tablet, Rfl: 3 .  Misc. Devices MISC, Requires O2 @ 3L/min continuously via nasal canula and home fill system,. (Patient taking differently: 1 each by Other route See admin instructions. Requires O2 @ 3L/min continuously via nasal canula and home fill system,.), Disp: 1 each, Rfl: 0 .  Misc. Devices MISC, Please provide Mr. Tinnel with insurance approved portable O2 concentrator ICD 10 J96.11 [phone] (Patient taking differently: 1 each by Other route See admin instructions. Please provide Mr. Swamy with insurance approved portable O2 concentrator ICD 10 J96.11 Z99.81), Disp: 1 each, Rfl: 0 .  Misc. Devices MISC, Please provide patient with XL sized insurance approved compression stockings. ICD 10 R60.0 and  I50.32, Disp: 1 each, Rfl: 0 .  omeprazole (PRILOSEC) 20 MG capsule, TAKE 1 CAPSULE (20 MG TOTAL) BY MOUTH 2 (TWO) TIMES DAILY BEFORE A MEAL. TAKE FIRST CAPSULE 30 MIN PRIOR TO EATING OR TAKING OTHER MEDICATIONS., Disp: 60 capsule, Rfl: 2 .  potassium chloride (KLOR-CON) 10 MEQ tablet, TAKE 60 MEQ (6 TABS) IN THE MORNING, 60 MEQ (6 TABS) IN THE AFTERNOON AND 40 MEQ (4 TABS) IN THE EVENING. TAKE AN EXTRA 4 TABS EVERY DOSE OF METOLAZONE, Disp: 540 tablet, Rfl: 3 .  PROVENTIL HFA 108 (90 Base) MCG/ACT inhaler, INHALE 2 PUFFS BY MOUTH EVERY 6 (SIX) HOURS AS NEEDED FOR WHEEZING OR SHORTNESS OF BREATH., Disp: 6.7 g, Rfl: 2 .  spironolactone (ALDACTONE) 50 MG tablet, TAKE 1 TABLET (50 MG TOTAL) BY MOUTH DAILY., Disp: 30 tablet, Rfl: 3 .  torsemide (DEMADEX) 100 MG tablet, TAKE ONE TABLET BY MOUTH TWICE DAILY (NOON+BEDTIME), Disp: 180 tablet, Rfl: 3 .  TRUEplus Lancets  28G MISC, Use as instructed. Check blood glucose level by fingerstick twice per day. (Patient taking differently: 1 each by Other route See admin instructions. Use as instructed. Check blood glucose level by fingerstick twice per day.), Disp: 200 each, Rfl: 3 .  XARELTO 20 MG TABS tablet, TAKE 1 TABLET BY MOUTH DAILY WITH SUPPER (BEDTIME), Disp: 60 tablet, Rfl: 6 No Known Allergies    Social History   Socioeconomic History  . Marital status: Single    Spouse name: Not on file  . Number of children: Not on file  . Years of education: Not on file  . Highest education level: Not on file  Occupational History  . Not on file  Tobacco Use  . Smoking status: Former Research scientist (life sciences)  . Smokeless tobacco: Never Used  . Tobacco comment: smoked weed in the past  Vaping Use  . Vaping Use: Never used  Substance and Sexual Activity  . Alcohol use: Not Currently  . Drug use: Not Currently    Types: Marijuana  . Sexual activity: Not Currently  Other  Topics Concern  . Not on file  Social History Narrative  . Not on file   Social Determinants of Health   Financial Resource Strain: Medium Risk  . Difficulty of Paying Living Expenses: Somewhat hard  Food Insecurity: Food Insecurity Present  . Worried About Charity fundraiser in the Last Year: Sometimes true  . Ran Out of Food in the Last Year: Sometimes true  Transportation Needs: No Transportation Needs  . Lack of Transportation (Medical): No  . Lack of Transportation (Non-Medical): No  Physical Activity:   . Days of Exercise per Week: Not on file  . Minutes of Exercise per Session: Not on file  Stress:   . Feeling of Stress : Not on file  Social Connections:   . Frequency of Communication with Friends and Family: Not on file  . Frequency of Social Gatherings with Friends and Family: Not on file  . Attends Religious Services: Not on file  . Active Member of Clubs or Organizations: Not on file  . Attends Archivist Meetings: Not on  file  . Marital Status: Not on file  Intimate Partner Violence:   . Fear of Current or Ex-Partner: Not on file  . Emotionally Abused: Not on file  . Physically Abused: Not on file  . Sexually Abused: Not on file    Physical Exam      Future Appointments  Date Time Provider [place]  09/21/2020  1:30 PM [facility] PA/NP [facility] None    BP (!) 182/0   Pulse 86   Resp (!) 22   Wt (!) 359 lb (162.8 kg)   SpO2 95%   BMI 51.51 kg/m   Weight yesterday-359 Last visit weight-353  Pt reports he is doing ok, he sometimes gets sob, uses 02. Using his CPAP. Frustrated with the living arrangements. He is next [place] [facility] for [facility].  He was due for his mid day dose of meds.  meds verified and potassium pill box refilled.  No c/p, no dizziness. No h/a.   -needs to refill his potassium, inhaler, and test strips.   [name], [facility] Paramedic  08/05/20

## 2020-08-11 ENCOUNTER — Other Ambulatory Visit (HOSPITAL_COMMUNITY): Payer: Self-pay

## 2020-08-11 DIAGNOSIS — I5032 Chronic diastolic (congestive) heart failure: Secondary | ICD-10-CM | POA: Diagnosis not present

## 2020-08-11 DIAGNOSIS — J9611 Chronic respiratory failure with hypoxia: Secondary | ICD-10-CM | POA: Diagnosis not present

## 2020-08-11 NOTE — Progress Notes (Signed)
Paramedicine Encounter    Patient ID: Benjamin [name], male    DOB: June 26, 1966, 54 y.o.   MRN: [id number]   Patient Care Team: [name], NP as PCP - General (Nurse Practitioner) [name], MD as PCP - Cardiology (Cardiology) [name], LCSW as Social Worker (Licensed Clinical Social Worker)  Patient Active Problem List   Diagnosis Date Noted   Obesity hypoventilation syndrome ([place]) 06/12/2020   Healthcare maintenance 06/12/2020   Acute on chronic diastolic heart failure ([place]) 02/24/2020   Diabetic neuropathy ([place]) 11/20/2019   Coagulation disorder ([place]) 11/20/2019   Acute on chronic diastolic CHF (congestive heart failure), NYHA class 3 ([place]) 07/30/2019   Diabetes mellitus without complication ([place]) 40/98/1191   Hypertensive emergency    BRBPR (bright red blood per rectum) 06/12/2019   Hypertension    CHF (congestive heart failure) ([place])    Sleep apnea    Chronic respiratory failure ([place])    Hypokalemia 05/29/2019   GI bleed 05/29/2019   Anemia 05/28/2019   Acute on chronic diastolic CHF (congestive heart failure) ([place]) 04/11/2019   Iron deficiency anemia 03/11/2019   OSA on CPAP 03/11/2019   HLD (hyperlipidemia) 03/11/2019   Elevated troponin 03/11/2019   GERD (gastroesophageal reflux disease) 03/11/2019   Rectal bleeding 02/20/2019   Dyspnea 02/06/2019   COVID-19 virus infection 02/06/2019   Bilateral lower extremity edema    Morbid obesity with BMI of 50.0-59.9, adult ([facility])    Chronic diastolic CHF (congestive heart failure) ([facility])    PAF (paroxysmal atrial fibrillation) ([place])    PE (pulmonary thromboembolism) ([place]) 01/21/2019   Hypertensive urgency 01/21/2019   Diabetes mellitus type 2 in obese ([place]) 01/21/2019    Current Outpatient Medications:    amLODipine (NORVASC) 10 MG tablet, TAKE ONE TABLET BY MOUTH ONCE DAILY, Disp: 30 tablet, Rfl: 2   atorvastatin (LIPITOR) 40 MG tablet, Take 1 tablet (40 mg total) by  mouth at bedtime., Disp: 90 tablet, Rfl: 2   Blood Glucose Monitoring Suppl (TRUE METRIX METER) w/Device KIT, Use as instructed. Check blood glucose level by fingerstick twice per day. (Patient taking differently: 1 each by Other route See admin instructions. Use as instructed. Check blood glucose level by fingerstick twice per day.), Disp: 1 kit, Rfl: 0   Blood Pressure Monitor DEVI, Please provide patient with insurance approved blood pressure device with L-XL cuff. BMI 55 (Patient taking differently: 1 each by Other route See admin instructions. Please provide patient with insurance approved blood pressure device with L-XL cuff. BMI 55), Disp: 1 each, Rfl: 0   carboxymethylcellulose (REFRESH PLUS) 0.5 % SOLN, Place 1 drop into both eyes daily. , Disp: , Rfl:    docusate sodium (COLACE) 100 MG capsule, Take 1 capsule (100 mg total) by mouth 2 (two) times daily., Disp: 180 capsule, Rfl: 2   doxazosin (CARDURA) 2 MG tablet, Take 1 tablet (2 mg total) by mouth daily., Disp: 30 tablet, Rfl: 11   ferrous sulfate 325 (65 FE) MG tablet, Take 1 tablet (325 mg total) by mouth daily., Disp: 90 tablet, Rfl: 2   glucose blood (TRUE METRIX BLOOD GLUCOSE TEST) test strip, Use as instructed. Check blood glucose level by fingerstick twice per day. (Patient taking differently: 1 each by Other route See admin instructions. Use as instructed. Check blood glucose level by fingerstick twice per day.), Disp: 100 each, Rfl: 12   labetalol (NORMODYNE) 200 MG tablet, TAKE 2 TABLETS (400 MG TOTAL) BY MOUTH 2 (TWO) TIMES DAILY., Disp: 120  tablet, Rfl: 3   losartan (COZAAR) 100 MG tablet, Take 1 tablet (100 mg total) by mouth daily., Disp: 30 tablet, Rfl: 11   magnesium oxide (MAG-OX) 400 (241.3 Mg) MG tablet, TAKE 2 TABLETS BY MOUTH EVERY MORNING AND 1 TABLET EVERY EVENING (AM+NOON+BEDTIME), Disp: 90 tablet, Rfl: 3   metFORMIN (GLUCOPHAGE) 500 MG tablet, TAKE ONE TABLET BY MOUTH TWICE DAILY WITH A MEAL (NOON+BEDTIME),  Disp: 180 tablet, Rfl: 0   methocarbamol (ROBAXIN) 500 MG tablet, Take 1 tablet (500 mg total) by mouth every 8 (eight) hours as needed for muscle spasms., Disp: 15 tablet, Rfl: 1   metolazone (ZAROXOLYN) 5 MG tablet, Take 1 tablet (5 mg total) by mouth See admin instructions. Take 1 tablet every Monday, Wednesday, and Friday., Disp: 15 tablet, Rfl: 3   Misc. Devices MISC, Requires O2 @ 3L/min continuously via nasal canula and home fill system,. (Patient taking differently: 1 each by Other route See admin instructions. Requires O2 @ 3L/min continuously via nasal canula and home fill system,.), Disp: 1 each, Rfl: 0   Misc. Devices MISC, Please provide [name] with insurance approved portable O2 concentrator ICD 10 J96.11 [phone] (Patient taking differently: 1 each by Other route See admin instructions. Please provide Mr. Franze with insurance approved portable O2 concentrator ICD 10 J96.11 Z99.81), Disp: 1 each, Rfl: 0   Misc. Devices MISC, Please provide patient with XL sized insurance approved compression stockings. ICD 10 R60.0 and  I50.32, Disp: 1 each, Rfl: 0   omeprazole (PRILOSEC) 20 MG capsule, TAKE 1 CAPSULE (20 MG TOTAL) BY MOUTH 2 (TWO) TIMES DAILY BEFORE A MEAL. TAKE FIRST CAPSULE 30 MIN PRIOR TO EATING OR TAKING OTHER MEDICATIONS., Disp: 60 capsule, Rfl: 2   potassium chloride (KLOR-CON) 10 MEQ tablet, TAKE 60 MEQ (6 TABS) IN THE MORNING, 60 MEQ (6 TABS) IN THE AFTERNOON AND 40 MEQ (4 TABS) IN THE EVENING. TAKE AN EXTRA 4 TABS EVERY DOSE OF METOLAZONE, Disp: 540 tablet, Rfl: 3   PROVENTIL HFA 108 (90 Base) MCG/ACT inhaler, INHALE 2 PUFFS BY MOUTH EVERY 6 (SIX) HOURS AS NEEDED FOR WHEEZING OR SHORTNESS OF BREATH., Disp: 6.7 g, Rfl: 2   spironolactone (ALDACTONE) 50 MG tablet, TAKE 1 TABLET (50 MG TOTAL) BY MOUTH DAILY., Disp: 30 tablet, Rfl: 3   torsemide (DEMADEX) 100 MG tablet, TAKE ONE TABLET BY MOUTH TWICE DAILY (NOON+BEDTIME), Disp: 180 tablet, Rfl: 3   TRUEplus Lancets  28G MISC, Use as instructed. Check blood glucose level by fingerstick twice per day. (Patient taking differently: 1 each by Other route See admin instructions. Use as instructed. Check blood glucose level by fingerstick twice per day.), Disp: 200 each, Rfl: 3   XARELTO 20 MG TABS tablet, TAKE 1 TABLET BY MOUTH DAILY WITH SUPPER (BEDTIME), Disp: 60 tablet, Rfl: 6 No Known Allergies    Social History   Socioeconomic History   Marital status: Single    Spouse name: Not on file   Number of children: Not on file   Years of education: Not on file   Highest education level: Not on file  Occupational History   Not on file  Tobacco Use   Smoking status: Former Smoker   Smokeless tobacco: Never Used   Tobacco comment: smoked weed in the past  Vaping Use   Vaping Use: Never used  Substance and Sexual Activity   Alcohol use: Not Currently   Drug use: Not Currently    Types: Marijuana   Sexual activity: Not Currently  Other  Topics Concern   Not on file  Social History Narrative   Not on file   Social Determinants of Health   Financial Resource Strain: Medium Risk   Difficulty of Paying Living Expenses: Somewhat hard  Food Insecurity: Food Insecurity Present   Worried About Running Out of Food in the Last Year: Sometimes true   Ran Out of Food in the Last Year: Sometimes true  Transportation Needs: No Transportation Needs   Lack of Transportation (Medical): No   Lack of Transportation (Non-Medical): No  Physical Activity:    Days of Exercise per Week: Not on file   Minutes of Exercise per Session: Not on file  Stress:    Feeling of Stress : Not on file  Social Connections:    Frequency of Communication with Friends and Family: Not on file   Frequency of Social Gatherings with Friends and Family: Not on file   Attends Religious Services: Not on file   Active Member of Clubs or Organizations: Not on file   Attends Archivist Meetings: Not on  file   Marital Status: Not on file  Intimate Partner Violence:    Fear of Current or Ex-Partner: Not on file   Emotionally Abused: Not on file   Physically Abused: Not on file   Sexually Abused: Not on file    Physical Exam      Future Appointments  Date Time Provider [place]  09/21/2020  1:30 PM [facility] PA/NP [facility] None    BP (!) 164/0    Pulse 84    Resp 20    Wt (!) 357 lb (161.9 kg)    SpO2 95%    BMI 51.22 kg/m   Weight yesterday-357 Last visit weight-359  Pt reports he is doing ok. He got up early this morning and he had fallen asleep right before I arrived.  meds verified, I p/u his potassium and inhaler prior to visit from pharmacy,  I filled up his potassium pill box for him.  He denies sob, no dizziness, no edema noted. No c/p. No h/a.  Pt states he has been having pain to one of his toes, feels like its burning.  He states his diabetic shoes have already worn out and needs another pair.  [facility] to sch him another appointment.  12/8 @ 10 dr patel   [name], EMT-Paramedic [place] Paramedic  08/11/20

## 2020-08-19 ENCOUNTER — Other Ambulatory Visit: Payer: Self-pay

## 2020-08-19 ENCOUNTER — Encounter (HOSPITAL_COMMUNITY): Payer: Self-pay | Admitting: Emergency Medicine

## 2020-08-19 ENCOUNTER — Emergency Department (HOSPITAL_COMMUNITY): Payer: Medicaid Other

## 2020-08-19 ENCOUNTER — Other Ambulatory Visit (HOSPITAL_COMMUNITY): Payer: Self-pay

## 2020-08-19 ENCOUNTER — Inpatient Hospital Stay (HOSPITAL_COMMUNITY)
Admission: EM | Admit: 2020-08-19 | Discharge: 2020-08-24 | DRG: 291 | Disposition: A | Discharge status: Home / Self Care | Payer: Medicaid Other | Attending: Family Medicine | Admitting: Family Medicine

## 2020-08-19 DIAGNOSIS — R079 Chest pain, unspecified: Secondary | ICD-10-CM

## 2020-08-19 DIAGNOSIS — Z9981 Dependence on supplemental oxygen: Secondary | ICD-10-CM

## 2020-08-19 DIAGNOSIS — Z9989 Dependence on other enabling machines and devices: Secondary | ICD-10-CM | POA: Diagnosis not present

## 2020-08-19 DIAGNOSIS — I499 Cardiac arrhythmia, unspecified: Secondary | ICD-10-CM | POA: Diagnosis not present

## 2020-08-19 DIAGNOSIS — I248 Other forms of acute ischemic heart disease: Secondary | ICD-10-CM | POA: Diagnosis present

## 2020-08-19 DIAGNOSIS — Z8616 Personal history of COVID-19: Secondary | ICD-10-CM

## 2020-08-19 DIAGNOSIS — Z7984 Long term (current) use of oral hypoglycemic drugs: Secondary | ICD-10-CM

## 2020-08-19 DIAGNOSIS — Z9114 Patient's other noncompliance with medication regimen: Secondary | ICD-10-CM

## 2020-08-19 DIAGNOSIS — E785 Hyperlipidemia, unspecified: Secondary | ICD-10-CM | POA: Diagnosis present

## 2020-08-19 DIAGNOSIS — I5033 Acute on chronic diastolic (congestive) heart failure: Secondary | ICD-10-CM | POA: Diagnosis not present

## 2020-08-19 DIAGNOSIS — N179 Acute kidney failure, unspecified: Secondary | ICD-10-CM | POA: Diagnosis not present

## 2020-08-19 DIAGNOSIS — I130 Hypertensive heart and chronic kidney disease with heart failure and stage 1 through stage 4 chronic kidney disease, or unspecified chronic kidney disease: Secondary | ICD-10-CM | POA: Diagnosis not present

## 2020-08-19 DIAGNOSIS — F1010 Alcohol abuse, uncomplicated: Secondary | ICD-10-CM | POA: Diagnosis present

## 2020-08-19 DIAGNOSIS — E662 Morbid (severe) obesity with alveolar hypoventilation: Secondary | ICD-10-CM | POA: Diagnosis present

## 2020-08-19 DIAGNOSIS — I4811 Longstanding persistent atrial fibrillation: Secondary | ICD-10-CM

## 2020-08-19 DIAGNOSIS — G4733 Obstructive sleep apnea (adult) (pediatric): Secondary | ICD-10-CM | POA: Diagnosis not present

## 2020-08-19 DIAGNOSIS — Z6841 Body Mass Index (BMI) 40.0 and over, adult: Secondary | ICD-10-CM | POA: Diagnosis not present

## 2020-08-19 DIAGNOSIS — E1122 Type 2 diabetes mellitus with diabetic chronic kidney disease: Secondary | ICD-10-CM | POA: Diagnosis present

## 2020-08-19 DIAGNOSIS — J80 Acute respiratory distress syndrome: Secondary | ICD-10-CM | POA: Diagnosis not present

## 2020-08-19 DIAGNOSIS — Z7901 Long term (current) use of anticoagulants: Secondary | ICD-10-CM

## 2020-08-19 DIAGNOSIS — I10 Essential (primary) hypertension: Secondary | ICD-10-CM | POA: Diagnosis present

## 2020-08-19 DIAGNOSIS — R778 Other specified abnormalities of plasma proteins: Secondary | ICD-10-CM | POA: Diagnosis not present

## 2020-08-19 DIAGNOSIS — T50916A Underdosing of multiple unspecified drugs, medicaments and biological substances, initial encounter: Secondary | ICD-10-CM | POA: Diagnosis present

## 2020-08-19 DIAGNOSIS — Z794 Long term (current) use of insulin: Secondary | ICD-10-CM | POA: Diagnosis not present

## 2020-08-19 DIAGNOSIS — E1140 Type 2 diabetes mellitus with diabetic neuropathy, unspecified: Secondary | ICD-10-CM | POA: Diagnosis present

## 2020-08-19 DIAGNOSIS — I5031 Acute diastolic (congestive) heart failure: Secondary | ICD-10-CM | POA: Diagnosis not present

## 2020-08-19 DIAGNOSIS — F4321 Adjustment disorder with depressed mood: Secondary | ICD-10-CM | POA: Diagnosis present

## 2020-08-19 DIAGNOSIS — Z91128 Patient's intentional underdosing of medication regimen for other reason: Secondary | ICD-10-CM | POA: Diagnosis not present

## 2020-08-19 DIAGNOSIS — J9611 Chronic respiratory failure with hypoxia: Secondary | ICD-10-CM | POA: Diagnosis not present

## 2020-08-19 DIAGNOSIS — F1410 Cocaine abuse, uncomplicated: Secondary | ICD-10-CM | POA: Diagnosis present

## 2020-08-19 DIAGNOSIS — K219 Gastro-esophageal reflux disease without esophagitis: Secondary | ICD-10-CM | POA: Diagnosis present

## 2020-08-19 DIAGNOSIS — R0602 Shortness of breath: Secondary | ICD-10-CM | POA: Diagnosis not present

## 2020-08-19 DIAGNOSIS — I5032 Chronic diastolic (congestive) heart failure: Secondary | ICD-10-CM | POA: Diagnosis present

## 2020-08-19 DIAGNOSIS — Z86711 Personal history of pulmonary embolism: Secondary | ICD-10-CM

## 2020-08-19 DIAGNOSIS — F1990 Other psychoactive substance use, unspecified, uncomplicated: Secondary | ICD-10-CM | POA: Diagnosis present

## 2020-08-19 DIAGNOSIS — I4891 Unspecified atrial fibrillation: Secondary | ICD-10-CM

## 2020-08-19 DIAGNOSIS — Z20822 Contact with and (suspected) exposure to covid-19: Secondary | ICD-10-CM | POA: Diagnosis not present

## 2020-08-19 DIAGNOSIS — Z833 Family history of diabetes mellitus: Secondary | ICD-10-CM

## 2020-08-19 DIAGNOSIS — F1910 Other psychoactive substance abuse, uncomplicated: Secondary | ICD-10-CM | POA: Diagnosis present

## 2020-08-19 DIAGNOSIS — R0789 Other chest pain: Secondary | ICD-10-CM | POA: Diagnosis not present

## 2020-08-19 DIAGNOSIS — E876 Hypokalemia: Secondary | ICD-10-CM | POA: Diagnosis not present

## 2020-08-19 DIAGNOSIS — I480 Paroxysmal atrial fibrillation: Secondary | ICD-10-CM | POA: Diagnosis not present

## 2020-08-19 DIAGNOSIS — I110 Hypertensive heart disease with heart failure: Secondary | ICD-10-CM | POA: Diagnosis not present

## 2020-08-19 LAB — TROPONIN I (HIGH SENSITIVITY)
Troponin I (High Sensitivity): 38 ng/L — ABNORMAL HIGH (ref <18)
Troponin I (High Sensitivity): 86 ng/L — ABNORMAL HIGH (ref <18)
Troponin I (High Sensitivity): 208 ng/L (ref <18)
Troponin I (High Sensitivity): 288 ng/L (ref <18)
Troponin I (High Sensitivity): 339 ng/L (ref <18)

## 2020-08-19 LAB — COMPREHENSIVE METABOLIC PANEL
ALT: 16 U/L (ref 0–44)
AST: 23 U/L (ref 15–41)
Albumin: 3.5 g/dL (ref 3.5–5.0)
Alkaline Phosphatase: 52 U/L (ref 38–126)
Anion gap: 15 (ref 5–15)
BUN: 19 mg/dL (ref 6–20)
CO2: 27 mmol/L (ref 22–32)
Calcium: 9.4 mg/dL (ref 8.9–10.3)
Chloride: 96 mmol/L — ABNORMAL LOW (ref 98–111)
Creatinine, Ser: 1.59 mg/dL — ABNORMAL HIGH (ref 0.61–1.24)
GFR, Estimated: 51 mL/min — ABNORMAL LOW (ref >60)
Glucose, Bld: 140 mg/dL — ABNORMAL HIGH (ref 70–99)
Potassium: 3.1 mmol/L — ABNORMAL LOW (ref 3.5–5.1)
Sodium: 138 mmol/L (ref 135–145)
Total Bilirubin: 0.5 mg/dL (ref 0.3–1.2)
Total Protein: 7.7 g/dL (ref 6.5–8.1)

## 2020-08-19 LAB — I-STAT ARTERIAL BLOOD GAS, ED
Acid-Base Excess: 8 mmol/L — ABNORMAL HIGH (ref 0.0–2.0)
Bicarbonate: 34.6 mmol/L — ABNORMAL HIGH (ref 20.0–28.0)
Calcium, Ion: 1.22 mmol/L (ref 1.15–1.40)
HCT: 52 % (ref 39.0–52.0)
Hemoglobin: 17.7 g/dL — ABNORMAL HIGH (ref 13.0–17.0)
O2 Saturation: 95 %
Potassium: 3.1 mmol/L — ABNORMAL LOW (ref 3.5–5.1)
Sodium: 137 mmol/L (ref 135–145)
TCO2: 36 mmol/L — ABNORMAL HIGH (ref 22–32)
pCO2 arterial: 55.3 mmHg — ABNORMAL HIGH (ref 32.0–48.0)
pH, Arterial: 7.405 (ref 7.350–7.450)
pO2, Arterial: 76 mmHg — ABNORMAL LOW (ref 83.0–108.0)

## 2020-08-19 LAB — CBC
HCT: 50.2 % (ref 39.0–52.0)
Hemoglobin: 14.7 g/dL (ref 13.0–17.0)
MCH: 22.3 pg — ABNORMAL LOW (ref 26.0–34.0)
MCHC: 29.3 g/dL — ABNORMAL LOW (ref 30.0–36.0)
MCV: 76.2 fL — ABNORMAL LOW (ref 80.0–100.0)
Platelets: 389 10*3/uL (ref 150–400)
RBC: 6.59 MIL/uL — ABNORMAL HIGH (ref 4.22–5.81)
RDW: 20.1 % — ABNORMAL HIGH (ref 11.5–15.5)
WBC: 5.9 10*3/uL (ref 4.0–10.5)
nRBC: 0 % (ref 0.0–0.2)

## 2020-08-19 LAB — RESP PANEL BY RT-PCR (FLU A&B, COVID) ARPGX2
Influenza A by PCR: NEGATIVE
Influenza B by PCR: NEGATIVE
SARS Coronavirus 2 by RT PCR: NEGATIVE

## 2020-08-19 LAB — RAPID URINE DRUG SCREEN, HOSP PERFORMED
Amphetamines: NOT DETECTED
Barbiturates: NOT DETECTED
Benzodiazepines: NOT DETECTED
Cocaine: POSITIVE — AB
Opiates: NOT DETECTED
Tetrahydrocannabinol: POSITIVE — AB

## 2020-08-19 LAB — GLUCOSE, CAPILLARY
Glucose-Capillary: 130 mg/dL — ABNORMAL HIGH (ref 70–99)
Glucose-Capillary: 102 mg/dL — ABNORMAL HIGH (ref 70–99)

## 2020-08-19 LAB — HEMOGLOBIN A1C
Hgb A1c MFr Bld: 6.1 % — ABNORMAL HIGH (ref 4.8–5.6)
Mean Plasma Glucose: 128.37 mg/dL

## 2020-08-19 LAB — BRAIN NATRIURETIC PEPTIDE: B Natriuretic Peptide: 40.6 pg/mL (ref 0.0–100.0)

## 2020-08-19 LAB — MAGNESIUM: Magnesium: 1.7 mg/dL (ref 1.7–2.4)

## 2020-08-19 MED ORDER — RIVAROXABAN 20 MG PO TABS
20.0000 mg | ORAL_TABLET | Freq: Every day | ORAL | Status: DC
  Administered 2020-08-19 – 2020-08-23 (×5): 20 mg via ORAL
  Filled 2020-08-19 (×5): qty 1

## 2020-08-19 MED ORDER — CARBOXYMETHYLCELLULOSE SODIUM 0.5 % OP SOLN: 1.0000 [drp] | Freq: Every day | OPHTHALMIC | Status: DC

## 2020-08-19 MED ORDER — TORSEMIDE 20 MG PO TABS: 100.0000 mg | ORAL_TABLET | Freq: Two times a day (BID) | ORAL | Status: DC

## 2020-08-19 MED ORDER — POTASSIUM CHLORIDE CRYS ER 20 MEQ PO TBCR
40.0000 meq | EXTENDED_RELEASE_TABLET | ORAL | Status: AC
  Administered 2020-08-19: 40 meq via ORAL
  Filled 2020-08-19: qty 2

## 2020-08-19 MED ORDER — INSULIN ASPART 100 UNIT/ML ~~LOC~~ SOLN
0.0000 [IU] | Freq: Three times a day (TID) | SUBCUTANEOUS | Status: DC
  Administered 2020-08-19: 2 [IU] via SUBCUTANEOUS
  Administered 2020-08-20: 3 [IU] via SUBCUTANEOUS
  Administered 2020-08-20: 2 [IU] via SUBCUTANEOUS

## 2020-08-19 MED ORDER — METHOCARBAMOL 500 MG PO TABS: 500.0000 mg | ORAL_TABLET | Freq: Three times a day (TID) | ORAL | Status: DC | PRN

## 2020-08-19 MED ORDER — ACETAMINOPHEN 325 MG PO TABS: 650.0000 mg | ORAL_TABLET | ORAL | Status: DC | PRN

## 2020-08-19 MED ORDER — POTASSIUM CHLORIDE CRYS ER 20 MEQ PO TBCR
60.0000 meq | EXTENDED_RELEASE_TABLET | Freq: Every day | ORAL | Status: DC
  Administered 2020-08-20 – 2020-08-24 (×5): 60 meq via ORAL
  Filled 2020-08-19: qty 6
  Filled 2020-08-19: qty 3
  Filled 2020-08-19: qty 6
  Filled 2020-08-19: qty 3
  Filled 2020-08-19: qty 6
  Filled 2020-08-19 (×2): qty 3

## 2020-08-19 MED ORDER — LOSARTAN POTASSIUM 50 MG PO TABS
100.0000 mg | ORAL_TABLET | Freq: Every day | ORAL | Status: DC
  Administered 2020-08-19: 100 mg via ORAL
  Filled 2020-08-19: qty 2

## 2020-08-19 MED ORDER — ATORVASTATIN CALCIUM 40 MG PO TABS
40.0000 mg | ORAL_TABLET | Freq: Every day | ORAL | Status: DC
  Administered 2020-08-19 – 2020-08-23 (×5): 40 mg via ORAL
  Filled 2020-08-19 (×5): qty 1

## 2020-08-19 MED ORDER — PANTOPRAZOLE SODIUM 40 MG PO TBEC
40.0000 mg | DELAYED_RELEASE_TABLET | Freq: Every day | ORAL | Status: DC
  Administered 2020-08-20 – 2020-08-24 (×5): 40 mg via ORAL
  Filled 2020-08-19 (×5): qty 1

## 2020-08-19 MED ORDER — DOXAZOSIN MESYLATE 2 MG PO TABS
2.0000 mg | ORAL_TABLET | Freq: Every day | ORAL | Status: DC
  Administered 2020-08-19 – 2020-08-24 (×6): 2 mg via ORAL
  Filled 2020-08-19 (×6): qty 1

## 2020-08-19 MED ORDER — SPIRONOLACTONE 25 MG PO TABS
50.0000 mg | ORAL_TABLET | Freq: Every day | ORAL | Status: DC
  Administered 2020-08-19 – 2020-08-20 (×2): 50 mg via ORAL
  Filled 2020-08-19 (×2): qty 2

## 2020-08-19 MED ORDER — MAGNESIUM OXIDE 400 (241.3 MG) MG PO TABS
400.0000 mg | ORAL_TABLET | Freq: Every day | ORAL | Status: DC
  Administered 2020-08-19 – 2020-08-23 (×5): 400 mg via ORAL
  Filled 2020-08-19 (×5): qty 1

## 2020-08-19 MED ORDER — FUROSEMIDE 10 MG/ML IJ SOLN
80.0000 mg | Freq: Once | INTRAMUSCULAR | Status: AC
  Administered 2020-08-19: 80 mg via INTRAVENOUS
  Filled 2020-08-19: qty 8

## 2020-08-19 MED ORDER — ONDANSETRON HCL 4 MG/2ML IJ SOLN: 4.0000 mg | Freq: Four times a day (QID) | INTRAMUSCULAR | Status: DC | PRN

## 2020-08-19 MED ORDER — METOLAZONE 5 MG PO TABS: 5.0000 mg | ORAL_TABLET | ORAL | Status: DC

## 2020-08-19 MED ORDER — TORSEMIDE 20 MG PO TABS
100.0000 mg | ORAL_TABLET | Freq: Two times a day (BID) | ORAL | Status: DC
  Administered 2020-08-20: 100 mg via ORAL
  Filled 2020-08-19: qty 5

## 2020-08-19 MED ORDER — FERROUS SULFATE 325 (65 FE) MG PO TABS
325.0000 mg | ORAL_TABLET | Freq: Every day | ORAL | Status: DC
  Administered 2020-08-19 – 2020-08-24 (×6): 325 mg via ORAL
  Filled 2020-08-19 (×6): qty 1

## 2020-08-19 MED ORDER — LABETALOL HCL 200 MG PO TABS
400.0000 mg | ORAL_TABLET | Freq: Two times a day (BID) | ORAL | Status: DC
  Administered 2020-08-19 – 2020-08-23 (×7): 400 mg via ORAL
  Filled 2020-08-19 (×8): qty 2

## 2020-08-19 MED ORDER — HYPROMELLOSE (GONIOSCOPIC) 2.5 % OP SOLN
1.0000 [drp] | Freq: Every day | OPHTHALMIC | Status: DC
  Administered 2020-08-19 – 2020-08-24 (×5): 1 [drp] via OPHTHALMIC
  Filled 2020-08-19: qty 15

## 2020-08-19 MED ORDER — ALBUTEROL SULFATE (2.5 MG/3ML) 0.083% IN NEBU
2.5000 mg | INHALATION_SOLUTION | Freq: Four times a day (QID) | RESPIRATORY_TRACT | Status: DC
  Administered 2020-08-20 – 2020-08-21 (×5): 2.5 mg via RESPIRATORY_TRACT
  Filled 2020-08-19 (×7): qty 3

## 2020-08-19 MED ORDER — DOCUSATE SODIUM 100 MG PO CAPS
100.0000 mg | ORAL_CAPSULE | Freq: Two times a day (BID) | ORAL | Status: DC
  Administered 2020-08-19 – 2020-08-24 (×7): 100 mg via ORAL
  Filled 2020-08-19 (×9): qty 1

## 2020-08-19 MED ORDER — DILTIAZEM HCL-DEXTROSE 125-5 MG/125ML-% IV SOLN (PREMIX)
5.0000 mg/h | INTRAVENOUS | Status: DC
  Administered 2020-08-19: 5 mg/h via INTRAVENOUS
  Administered 2020-08-19: 15 mg/h via INTRAVENOUS
  Administered 2020-08-20 – 2020-08-21 (×3): 10 mg/h via INTRAVENOUS
  Filled 2020-08-19 (×5): qty 125

## 2020-08-19 NOTE — Progress Notes (Signed)
Third troponin came back at 208. Discussed with Dr. Shari Prows, no plan for IV heparin. Xarelto restarted today.  Corinn Stoltzfus Fransico Michael, PA-C

## 2020-08-19 NOTE — Progress Notes (Signed)
°   08/19/20 1520  Assess: MEWS Score  BP (!) 139/94  Pulse Rate 89  ECG Heart Rate (!) 123  Resp 15  Level of Consciousness Alert  SpO2 94 %  O2 Device Nasal Cannula  O2 Flow Rate (L/min) 3 L/min  Assess: MEWS Score  MEWS Temp 0  MEWS Systolic 0  MEWS Pulse 2  MEWS RR 0  MEWS LOC 0  MEWS Score 2  MEWS Score Color Yellow  Assess: if the MEWS score is Yellow or Red  Were vital signs taken at a resting state? Yes  Focused Assessment No change from prior assessment  Early Detection of Sepsis Score *See Row Information* Low  MEWS guidelines implemented *See Row Information* Yes  Treat  Pain Scale 0-10  Pain Score 0  Take Vital Signs  Increase Vital Sign Frequency  Yellow: Q 2hr X 2 then Q 4hr X 2, if remains yellow, continue Q 4hrs  Escalate  MEWS: Escalate Yellow: discuss with charge nurse/RN and consider discussing with provider and RRT  Notify: Charge Nurse/RN  Name of Charge Nurse/RN Notified Minna Antis,  Document  Patient Outcome Stabilized after interventions  Progress note created (see row info) Yes

## 2020-08-19 NOTE — H&P (Signed)
History and Physical    Benjamin [name] ZOX:[id number] DOB: [birth year] DOA: 08/19/2020  PCP: [name], NP (Confirm with patient/family/NH records and if not entered, this has to be entered at [facility] point of entry) Patient coming from: Home  I have personally briefly reviewed patient's old medical records in [place]  Chief Complaint: Shortness of breath  HPI: Benjamin [name] is a 54 y.o. male with medical history significant of chronic diastolic CHF, chronic A. fib, chronic hypoxic respiratory failure secondary to CHF, history of PE, CKD stage II, chronic hypokalemia, HTN, morbid obesity, IIDM, presented with increasing short of breath and weakness.  Patient claimed that he was on a trip out of town over the weekend, and forgot to take all his CHF medications along with him, as a result, he has not been taking any of his medication for last 3 days.  This morning, he started to feel extremely shortness of breath, heaviness in the chest, denied any cough, no no fever chills. ED Course: Patient was found to be in rapid A. fib, Cardizem drip started.  Chest x-ray showed chronic lung vasculature congestion.  Review of Systems: As per HPI otherwise 14 point review of systems negative.    Past Medical History:  Diagnosis Date  . A-fib ([place])   . Anemia 05/29/2019  . CHF (congestive heart failure) ([place])   . Chronic respiratory failure ([place])   . Diabetes mellitus without complication ([place])   . Dyspnea   . Elevated troponin 02/06/2019  . Hypertension   . Obesity   . Pulmonary embolism ([place])   . Rectal bleeding 05/29/2019  . Sleep apnea     Past Surgical History:  Procedure Laterality Date  . COLONOSCOPY WITH PROPOFOL Left 02/25/2019   Procedure: COLONOSCOPY WITH PROPOFOL;  Surgeon: Arta Silence, MD;  Location: [place];  Service: Endoscopy;  Laterality: Left;  . ESOPHAGOGASTRODUODENOSCOPY (EGD) WITH PROPOFOL Left 02/25/2019   Procedure: ESOPHAGOGASTRODUODENOSCOPY  (EGD) WITH PROPOFOL;  Surgeon: Arta Silence, MD;  Location: [facility]COPY;  Service: Endoscopy;  Laterality: Left;  . ESOPHAGOGASTRODUODENOSCOPY (EGD) WITH PROPOFOL N/A 06/12/2019   Procedure: ESOPHAGOGASTRODUODENOSCOPY (EGD) WITH PROPOFOL;  Surgeon: Clarene Essex, MD;  Location: [place];  Service: Gastroenterology;  Laterality: N/A;  . FLEXIBLE SIGMOIDOSCOPY N/A 06/12/2019   Procedure: FLEXIBLE SIGMOIDOSCOPY;  Surgeon: Clarene Essex, MD;  Location: [place];  Service: Gastroenterology;  Laterality: N/A;  . HEMORRHOID BANDING  05/2019  . NO PAST SURGERIES    . RIGHT HEART CATH N/A 04/17/2019   Procedure: RIGHT HEART CATH;  Surgeon: Larey Dresser, MD;  Location: [place] [facility];  Service: Cardiovascular;  Laterality: N/A;     reports that he has quit smoking. He has never used smokeless tobacco. He reports previous alcohol use. He reports previous drug use. Drug: Marijuana.  No Known Allergies  Family History  Problem Relation Age of Onset  . Hypertension Mother   . Diabetes Mother      Prior to Admission medications   Medication Sig Start Date End Date Taking? Authorizing Provider  amLODipine (NORVASC) 10 MG tablet TAKE ONE TABLET BY MOUTH ONCE DAILY 04/08/20   [name], NP  atorvastatin (LIPITOR) 40 MG tablet Take 1 tablet (40 mg total) by mouth at bedtime. 01/20/20 04/20/20  Clegg, Amy D, NP  Blood Glucose Monitoring Suppl (TRUE METRIX METER) w/Device KIT Use as instructed. Check blood glucose level by fingerstick twice per day. Patient taking differently: 1 each by Other route See admin instructions. Use  as instructed. Check blood glucose level by fingerstick twice per day. 03/18/19   [name], NP  Blood Pressure Monitor DEVI Please provide patient with insurance approved blood pressure device with L-XL cuff. BMI 55 Patient taking differently: 1 each by Other route See admin instructions. Please provide patient with insurance approved blood pressure device with  L-XL cuff. BMI 55 10/04/19   [name], NP  carboxymethylcellulose (REFRESH PLUS) 0.5 % SOLN Place 1 drop into both eyes daily.     [provider]  docusate sodium (COLACE) 100 MG capsule Take 1 capsule (100 mg total) by mouth 2 (two) times daily. 05/22/20 08/20/20  [name], NP  doxazosin (CARDURA) 2 MG tablet Take 1 tablet (2 mg total) by mouth daily. 07/02/20 07/02/21  Lyda Jester M, PA-C  ferrous sulfate 325 (65 FE) MG tablet Take 1 tablet (325 mg total) by mouth daily. 05/22/20 08/20/20  [name], NP  glucose blood (TRUE METRIX BLOOD GLUCOSE TEST) test strip Use as instructed. Check blood glucose level by fingerstick twice per day. Patient taking differently: 1 each by Other route See admin instructions. Use as instructed. Check blood glucose level by fingerstick twice per day. 03/18/19   [name], NP  labetalol (NORMODYNE) 200 MG tablet TAKE 2 TABLETS (400 MG TOTAL) BY MOUTH 2 (TWO) TIMES DAILY. 04/20/20   [name], MD  losartan (COZAAR) 100 MG tablet Take 1 tablet (100 mg total) by mouth daily. 05/04/20   Clegg, Amy D, NP  magnesium oxide (MAG-OX) 400 (241.3 Mg) MG tablet TAKE 2 TABLETS BY MOUTH EVERY MORNING AND 1 TABLET EVERY EVENING (AM+NOON+BEDTIME) 05/27/20   Larey Dresser, MD  metFORMIN (GLUCOPHAGE) 500 MG tablet TAKE ONE TABLET BY MOUTH TWICE DAILY WITH A MEAL (NOON+BEDTIME) 05/26/20   [name], NP  methocarbamol (ROBAXIN) 500 MG tablet Take 1 tablet (500 mg total) by mouth every 8 (eight) hours as needed for muscle spasms. 04/09/20   [name], NP  metolazone (ZAROXOLYN) 5 MG tablet Take 1 tablet (5 mg total) by mouth See admin instructions. Take 1 tablet every Monday, Wednesday, and Friday. 05/27/20   Larey Dresser, MD  Misc. Devices MISC Requires O2 @ 3L/min continuously via nasal canula and home fill system,. Patient taking differently: 1 each by Other route See admin instructions. Requires O2 @ 3L/min continuously via nasal  canula and home fill system,. 07/03/19   [name], NP  Misc. Devices MISC Please provide [name] with insurance approved portable O2 concentrator ICD 10 J96.11 Z99.81 Patient taking differently: 1 each by Other route See admin instructions. Please provide Mr. Perfect with insurance approved portable O2 concentrator ICD 10 J96.11 Z99.81 08/27/19   [name], NP  Misc. Devices MISC Please provide patient with XL sized insurance approved compression stockings. ICD 10 R60.0 and  I50.32 04/19/20   [name], NP  omeprazole (PRILOSEC) 20 MG capsule TAKE 1 CAPSULE (20 MG TOTAL) BY MOUTH 2 (TWO) TIMES DAILY BEFORE A MEAL. TAKE FIRST CAPSULE 30 MIN PRIOR TO EATING OR TAKING OTHER MEDICATIONS. 04/08/20 06/22/20  [name], NP  potassium chloride (KLOR-CON) 10 MEQ tablet TAKE 60 MEQ (6 TABS) IN THE MORNING, 60 MEQ (6 TABS) IN THE AFTERNOON AND 40 MEQ (4 TABS) IN THE EVENING. TAKE AN EXTRA 4 TABS EVERY DOSE OF METOLAZONE 05/26/20   Clegg, Amy D, NP  PROVENTIL HFA 108 (90 Base) MCG/ACT inhaler INHALE 2 PUFFS BY MOUTH  EVERY 6 (SIX) HOURS AS NEEDED FOR WHEEZING OR SHORTNESS OF BREATH. 03/05/20   Charlott Rakes, MD  spironolactone (ALDACTONE) 50 MG tablet TAKE 1 TABLET (50 MG TOTAL) BY MOUTH DAILY. 03/16/20   Clegg, Amy D, NP  torsemide (DEMADEX) 100 MG tablet TAKE ONE TABLET BY MOUTH TWICE DAILY (NOON+BEDTIME) 07/06/20   Larey Dresser, MD  TRUEplus Lancets 28G MISC Use as instructed. Check blood glucose level by fingerstick twice per day. Patient taking differently: 1 each by Other route See admin instructions. Use as instructed. Check blood glucose level by fingerstick twice per day. 03/18/19   [name], NP  XARELTO 20 MG TABS tablet TAKE 1 TABLET BY MOUTH DAILY WITH SUPPER (BEDTIME) 07/14/20   Consuelo Pandy, [place]    Physical Exam: Vitals:   08/19/20 1345 08/19/20 1415 08/19/20 1430 08/19/20 1445  BP: (!) 135/93 (!) 142/110 (!) 127/107 138/86  Pulse: (!) 120 (!) 135  (!) 108 (!) 108  Resp: _0 Temp:      TempSrc:      SpO2: 93% 96% 98% 95%  Weight:      Height:        Constitutional: NAD, calm, comfortable Vitals:   08/19/20 1345 08/19/20 1415 08/19/20 1430 08/19/20 1445  BP: (!) 135/93 (!) 142/110 (!) 127/107 138/86  Pulse: (!) 120 (!) 135 (!) 108 (!) 108  Resp: _1 Temp:      TempSrc:      SpO2: 93% 96% 98% 95%  Weight:      Height:       Eyes: PERRL, lids and conjunctivae normal ENMT: Mucous membranes are moist. Posterior pharynx clear of any exudate or lesions.Normal dentition.  Neck: normal, supple, no masses, no thyromegaly Respiratory: clear to auscultation bilaterally, no wheezing, no crackles. Normal respiratory effort. No accessory muscle use.  Cardiovascular: Irregular heart rate, no murmurs / rubs / gallops. No extremity edema. 2+ pedal pulses. No carotid bruits.  Abdomen: no tenderness, no masses palpated. No hepatosplenomegaly. Bowel sounds positive.  Musculoskeletal: no clubbing / cyanosis. No joint deformity upper and lower extremities. Good ROM, no contractures. Normal muscle tone.  Skin: no rashes, lesions, ulcers. No induration Neurologic: CN 2-12 grossly intact. Sensation intact, DTR normal. Strength 5/5 in all 4.  Psychiatric: Normal judgment and insight. Alert and oriented x 3. Normal mood.     Labs on Admission: I have personally reviewed following labs and imaging studies  CBC: Recent Labs  Lab 08/19/20 1018 08/19/20 1120  WBC 5.9  --   HGB 14.7 17.7*  HCT 50.2 52.0  MCV 76.2*  --   PLT 389  --    Basic Metabolic Panel: Recent Labs  Lab 08/19/20 1018 08/19/20 1120  NA 138 137  K 3.1* 3.1*  CL 96*  --   CO2 27  --   GLUCOSE 140*  --   BUN 19  --   CREATININE 1.59*  --   CALCIUM 9.4  --    GFR: Estimated Creatinine Clearance: 81.6 mL/min (A) (by C-G formula based on SCr of 1.59 mg/dL (H)). Liver Function Tests: Recent Labs  Lab 08/19/20 1018  AST 23  ALT 16  ALKPHOS 52    BILITOT 0.5  PROT 7.7  ALBUMIN 3.5   No results for input(s): LIPASE, AMYLASE in the last 168 hours. No results for input(s): AMMONIA in the last 168 hours. Coagulation Profile: No results for input(s): INR, PROTIME in the last 168  hours. Cardiac Enzymes: No results for input(s): CKTOTAL, CKMB, CKMBINDEX, TROPONINI in the last 168 hours. BNP (last 3 results) No results for input(s): PROBNP in the last 8760 hours. HbA1C: No results for input(s): HGBA1C in the last 72 hours. CBG: No results for input(s): GLUCAP in the last 168 hours. Lipid Profile: No results for input(s): CHOL, HDL, LDLCALC, TRIG, CHOLHDL, LDLDIRECT in the last 72 hours. Thyroid Function Tests: No results for input(s): TSH, T4TOTAL, FREET4, T3FREE, THYROIDAB in the last 72 hours. Anemia Panel: No results for input(s): VITAMINB12, FOLATE, FERRITIN, TIBC, IRON, RETICCTPCT in the last 72 hours. Urine analysis:    Component Value Date/Time   COLORURINE STRAW (A) 04/13/2019 0044   APPEARANCEUR CLEAR 04/13/2019 0044   LABSPEC 1.009 04/13/2019 0044   PHURINE 5.0 04/13/2019 [place] 04/13/2019 0044   HGBUR NEGATIVE 04/13/2019 0044   [place] 04/13/2019 0044   [place] 04/13/2019 0044   PROTEINUR NEGATIVE 04/13/2019 0044   NITRITE NEGATIVE 04/13/2019 0044   LEUKOCYTESUR NEGATIVE 04/13/2019 0044    Radiological Exams on Admission: DG Chest Port 1 View  Result Date: 08/19/2020 CLINICAL DATA:  Chest pain shortness of breath EXAM: PORTABLE CHEST 1 VIEW COMPARISON:  02/24/2020 FINDINGS: Trachea midline. Heart size remains enlarged with fullness of LEFT and RIGHT hilum as before. Heart size accentuated by portable technique. Increased interstitial markings throughout the chest. Small amount of fluid possible on the RIGHT minor fissure. No lobar consolidative changes. On limited assessment no acute skeletal process. IMPRESSION: Findings that favor heart failure and/or volume overload.  Slight asymmetry could also be seen in the setting of atypical or viral infection. Electronically Signed   By: Zetta Bills M.D.   On: 08/19/2020 10:39    EKG: Independently reviewed.  Rapid A. fib  Assessment/Plan Active Problems:   A-fib (HCC)  (please populate well all problems here in Problem List. (For example, if patient is on BP meds at home and you resume or decide to hold them, it is a problem that needs to be her. Same for CAD, COPD, HLD and so on)  A. fib with RVR -Secondary to noncompliance -Agreed with Cardizem drip, hold other CCB for now. -It appears that patient oxygenation level at his baseline, chest x-ray seems to be stable compared to before, will continue his home regimen of diuresis torsemide plus metolazone regimen -Check UDS -Continue Toradol  Acute on chronic diastolic CHF decompensation -Secondary to uncontrolled A. fib, management as above -Continue p.o. diuresis regimen  Uncontrolled hypertension -Hold CCB, as mentioned above while on Cardizem drip -Continue other home BP medications  AKI on CKD stage II -Suspect this is prerenal secondary to cardiorenal syndrome from uncontrolled A. Fib -Heart rate control and continue diuresis, monitor kidney function  Hypokalemia -Replace and recheck  IIDM -Hold Metformin for now given AKI, start sliding scale    DVT prophylaxis: Xarelto Code Status: Full code Family Communication: None at bedside Disposition Plan: Expect more than 2 midnight hospital stay to treat A. fib and CHF decompensation Consults called: Cardiology Admission status: PCU   Lequita Halt MD [facility] Pager [phone]  08/19/2020, 2:57 PM

## 2020-08-19 NOTE — ED Triage Notes (Signed)
Pt BIB [facility] from home. Pt states he went out of town and hasnt been taking his medicine. Pt complaint of chest pain and shortness of breath. Pt Afib w/ HR 140-170 upon EMS arrival. Pt received 20 mg Cardizem via EMS. HR to 90-120s after. Pt also received 324 aspirin and nitro x1. VSS.

## 2020-08-19 NOTE — Progress Notes (Signed)
Pt called me and could hardly speak, I could get out that he needed help. I got EMS unit on the way there as well. He had not taken hardly any of his potassium tablets for all week and I couldn't find his bubble pack of his other meds.  He denied drug use.   EMS txp to hosp. Will f/u once he is d/c.  I called pharmacy and will p/u meds tomor.

## 2020-08-19 NOTE — Progress Notes (Signed)
CRITICAL VALUE ALERT  Critical Value:  208  Date & Time Notied:  1721  Provider Notified: Cadence  Fransico Michael, PA  Orders Received/Actions taken: Yes, see orders

## 2020-08-19 NOTE — Plan of Care (Signed)
  Problem: Activity: Goal: Ability to tolerate increased activity will improve Outcome: Progressing   Problem: Cardiac: Goal: Ability to achieve and maintain adequate cardiopulmonary perfusion will improve Outcome: Progressing   Problem: Cardiac: Goal: Ability to achieve and maintain adequate cardiopulmonary perfusion will improve Outcome: Progressing

## 2020-08-19 NOTE — Progress Notes (Signed)
   08/19/20 1520  Assess: MEWS Score  Temp 98.3 F (36.8 C)  BP (!) 139/94  Pulse Rate 89  ECG Heart Rate (!) 123  Resp 15  Level of Consciousness Alert  SpO2 94 %  O2 Device Nasal Cannula  O2 Flow Rate (L/min) 3 L/min  Assess: MEWS Score  MEWS Temp 0  MEWS Systolic 0  MEWS Pulse 2  MEWS RR 0  MEWS LOC 0  MEWS Score 2  MEWS Score Color Yellow  Assess: if the MEWS score is Yellow or Red  Were vital signs taken at a resting state? Yes  Focused Assessment No change from prior assessment  Early Detection of Sepsis Score *See Row Information* Low  MEWS guidelines implemented *See Row Information* Yes  Treat  Pain Scale 0-10  Pain Score 0  Take Vital Signs  Increase Vital Sign Frequency  Yellow: Q 2hr X 2 then Q 4hr X 2, if remains yellow, continue Q 4hrs  Escalate  MEWS: Escalate Yellow: discuss with charge nurse/RN and consider discussing with provider and RRT  Notify: Charge Nurse/RN  Name of Charge Nurse/RN Notified Minna Antis,  Date Charge Nurse/RN Notified 08/19/20  Time Charge Nurse/RN Notified 1520  Document  Patient Outcome Stabilized after interventions  Progress note created (see row info) Yes

## 2020-08-19 NOTE — Consult Note (Addendum)
Advanced Heart Failure Team Consult Note   Primary Physician: Benjamin Pounds, NP PCP-Cardiologist:  Benjamin Carnes, MD  [employer]: Benjamin [name]   Reason for Consultation: Acute on chronic diastolic heart failure  HPI:    Benjamin [name] is seen today for evaluation of acute on chronic diastolic heart failure at the request of Dr. Roosevelt [name], Internal Medicine   Benjamin [name] a 54 yo with hx of diastolic CHF, HTN, DM, Atrial fib, PE in April 2020 (on Xarelto), OSA, GERD,chronic elevation of troponin.  Admitted 01/21/2019 with increased dyspnea and had PE. He was anticoagulated.   Patient re-admitted 5/20 for syncope/cough/dyspnea, COVID+, CT positive for bilateral GGOs, admitted to [facility] and discharged after 6 days, on O2. Rec'[name] Cards f/u.  Patient re-admitted 02/20/19 for bright red blood per rectum, eval'ed by GI, had EGD/colon, dx'[name] with hemorrhoidal bleeding, discharged after 9 days on O2.He was again Covid + .   Readmitted 03/11/19 with increased shortness of breath. He did not have home oxygen. He was discharged to home the next day.  He presented to [facility] ED7/23/20with lower extremity edema. COVID negative. Diuresed with lasix drip and transitioned to torsemide 40 mg twice daily. Had Central with mild volume overload and preserved cardiac output. Discharge weight was 407.9 [name].   He was readmitted with symptomatic anemia 05/28/19 with hgb 7.1. Received 1UPRBCs. GI consulted. He started on anusol and continued on stool softner.  He was readmitted to the hospital on 07/16/19 when he presented to the ED w/ worsening symptoms and was admitted by IM for a/c diastolic CHF and treated w/ IV Lasix. Had 2 day hospital stay and was discharged on 10/29.Echowas done10/28 and showed normal LVEF65-70%, RV interpreted as normal but suspect significant RV dysfunction. After diuresis w/ IV Lasix, he was placed back on home diuretic regimen, torsemide 100 mg bid. Lisinopril 5 mg was also added to  regimen. AHF team was not consultedthat admit.  He was seen back in clinic 07/30/19.Feltpoorly. SOB at rest and worse w/ exertion. Also w/ orthopnea/PND. Significant wt gainof 20 lbup to 428 lb (dry wt ~407 lb), w/ marked abdominal distention and LEE and poor response to IV diuretics.  Once diuresed transitioned to torsemide 100 mg twice a day. [name]/C 408 [name].   Zio patch on 10/20/19-No arrhythmia.   As noted above he has had multiple admissions in the past for a/c HF in the setting of poor compliance w/ meds and dietary indiscretion w/ sodium. His last admission was 6/21 and he had been doing better recently w/ the help of paramedicine and also enrolled in a healthy lifestyles program and was exercising more and had lost wt.   Unfortunately he got depressed this past holiday weekend. His mother passed away this summer and he had no family to spend Thanksgiving with. Did drugs. Admits to drinking a bottle of liquor and used marijuana, and cocaine. Was not taking his meds. Came to ED because of CP and dyspnea/othopnea. Found to be in rapid Afib. BNP only 40. CXR shows central vascular congestion w/o overt edema. SCr 1.59 (baseline 1.2-1.4). K 3.1. Hs trop 38>>86.  He is being admitted by IM. Currently on Cardizem gtt at 5 mg/hr. Current HR in the 120s. BP moderately elevated.     Review of Systems: [y] = yes, [ ] = no   . General: Weight gain [ ]; Weight loss [ ]; Anorexia [ ]; Fatigue [ ]; Fever [ ]; Chills [ ]; Weakness [ ]  .  Cardiac: Chest pain/pressure [ ]; Resting SOB [ ]; Exertional SOB [ ]; Orthopnea [ ]; Pedal Edema [ ]; Palpitations [ ]; Syncope [ ]; Presyncope [ ]; Paroxysmal nocturnal dyspnea[ ]  . Pulmonary: Cough [ ]; Wheezing[ ]; Hemoptysis[ ]; Sputum [ ]; Snoring [ ]  . GI: Vomiting[ ]; Dysphagia[ ]; Melena[ ]; Hematochezia [ ]; Heartburn[ ]; Abdominal pain [ ]; Constipation [ ]; Diarrhea [ ]; BRBPR [ ]  . GU: Hematuria[ ]; Dysuria [ ]; Nocturia[ ]  . Vascular: Pain in legs  with walking [ ]; Pain in feet with lying flat [ ]; Non-healing sores [ ]; Stroke [ ]; TIA [ ]; Slurred speech [ ];  . Neuro: Headaches[ ]; Vertigo[ ]; Seizures[ ]; Paresthesias[ ];Blurred vision [ ]; Diplopia [ ]; Vision changes [ ]  . Ortho/Skin: Arthritis [ ]; Joint pain [ ]; Muscle pain [ ]; Joint swelling [ ]; Back Pain [ ]; Rash [ ]  . Psych: Depression[ ]; Anxiety[ ]  . Heme: Bleeding problems [ ]; Clotting disorders [ ]; Anemia [ ]  . Endocrine: Diabetes [ ]; Thyroid dysfunction[ ]  Home Medications Prior to Admission medications   Medication Sig Start Date End Date Taking? Authorizing Provider  amLODipine (NORVASC) 10 MG tablet TAKE ONE TABLET BY MOUTH ONCE DAILY 04/08/20   Benjamin Pounds, NP  atorvastatin (LIPITOR) 40 MG tablet Take 1 tablet (40 mg total) by mouth at bedtime. 01/20/20 04/20/20  Benjamin [name], Benjamin D, NP  Blood Glucose Monitoring Suppl (TRUE METRIX METER) w/Device KIT Use as instructed. Check blood glucose level by fingerstick twice per day. Patient taking differently: 1 each by Other route See admin instructions. Use as instructed. Check blood glucose level by fingerstick twice per day. 03/18/19   Benjamin Pounds, NP  Blood Pressure Monitor DEVI Please provide patient with insurance approved blood pressure device with L-XL cuff. BMI 55 Patient taking differently: 1 each by Other route See admin instructions. Please provide patient with insurance approved blood pressure device with L-XL cuff. BMI 55 10/04/19   Benjamin Pounds, NP  carboxymethylcellulose (REFRESH PLUS) 0.5 % SOLN Place 1 drop into both eyes daily.     [provider]  docusate sodium (COLACE) 100 MG capsule Take 1 capsule (100 mg total) by mouth 2 (two) times daily. 05/22/20 08/20/20  Benjamin Pounds, NP  doxazosin (CARDURA) 2 MG tablet Take 1 tablet (2 mg total) by mouth daily. 07/02/20 07/02/21  Benjamin Jester M, PA-C  ferrous sulfate 325 (65 FE) MG tablet Take 1 tablet (325 mg total) by mouth daily.  05/22/20 08/20/20  Benjamin Pounds, NP  glucose blood (TRUE METRIX BLOOD GLUCOSE TEST) test strip Use as instructed. Check blood glucose level by fingerstick twice per day. Patient taking differently: 1 each by Other route See admin instructions. Use as instructed. Check blood glucose level by fingerstick twice per day. 03/18/19   Benjamin Pounds, NP  labetalol (NORMODYNE) 200 MG tablet TAKE 2 TABLETS (400 MG TOTAL) BY MOUTH 2 (TWO) TIMES DAILY. 04/20/20   [name], MD  losartan (COZAAR) 100 MG tablet Take 1 tablet (100 mg total) by mouth daily. 05/04/20   Benjamin [name], Benjamin D, NP  magnesium oxide (MAG-OX) 400 (241.3 Mg) MG tablet TAKE 2 TABLETS BY MOUTH EVERY MORNING AND 1 TABLET EVERY EVENING (AM+NOON+BEDTIME) 05/27/20   Larey Dresser, MD  metFORMIN (GLUCOPHAGE) 500 MG tablet TAKE ONE TABLET BY MOUTH TWICE DAILY WITH A MEAL (NOON+BEDTIME) 05/26/20  Fleming, [name] W, NP  methocarbamol (ROBAXIN) 500 MG tablet Take 1 tablet (500 mg total) by mouth every 8 (eight) hours as needed for muscle spasms. 04/09/20   Fleming, [name] W, NP  metolazone (ZAROXOLYN) 5 MG tablet Take 1 tablet (5 mg total) by mouth See admin instructions. Take 1 tablet every Monday, Wednesday, and Friday. 05/27/20   Brithney Bensen S, MD  Misc. Devices MISC Requires O2 @ 3L/min continuously via nasal canula and home fill system,. Patient taking differently: 1 each by Other route See admin instructions. Requires O2 @ 3L/min continuously via nasal canula and home fill system,. 07/03/19   Fleming, [name] W, NP  Misc. Devices MISC Please provide Mr. Servello with insurance approved portable O2 concentrator ICD 10 J96.11 Z99.81 Patient taking differently: 1 each by Other route See admin instructions. Please provide Mr. Kaufman with insurance approved portable O2 concentrator ICD 10 J96.11 Z99.81 08/27/19   Fleming, [name] W, NP  Misc. Devices MISC Please provide patient with XL sized insurance approved compression stockings. ICD 10 R60.0 and  I50.32  04/19/20   Fleming, [name] W, NP  omeprazole (PRILOSEC) 20 MG capsule TAKE 1 CAPSULE (20 MG TOTAL) BY MOUTH 2 (TWO) TIMES DAILY BEFORE A MEAL. TAKE FIRST CAPSULE 30 MIN PRIOR TO EATING OR TAKING OTHER MEDICATIONS. 04/08/20 06/22/20  Fleming, [name] W, NP  potassium chloride (KLOR-CON) 10 MEQ tablet TAKE 60 MEQ (6 TABS) IN THE MORNING, 60 MEQ (6 TABS) IN THE AFTERNOON AND 40 MEQ (4 TABS) IN THE EVENING. TAKE AN EXTRA 4 TABS EVERY DOSE OF METOLAZONE 05/26/20   Benjamin [name], Benjamin D, NP  PROVENTIL HFA 108 (90 Base) MCG/ACT inhaler INHALE 2 PUFFS BY MOUTH EVERY 6 (SIX) HOURS AS NEEDED FOR WHEEZING OR SHORTNESS OF BREATH. 03/05/20   Newlin, Enobong, MD  spironolactone (ALDACTONE) 50 MG tablet TAKE 1 TABLET (50 MG TOTAL) BY MOUTH DAILY. 03/16/20   Benjamin [name], Benjamin D, NP  torsemide (DEMADEX) 100 MG tablet TAKE ONE TABLET BY MOUTH TWICE DAILY (NOON+BEDTIME) 07/06/20   Corydon Schweiss S, MD  TRUEplus Lancets 28G MISC Use as instructed. Check blood glucose level by fingerstick twice per day. Patient taking differently: 1 each by Other route See admin instructions. Use as instructed. Check blood glucose level by fingerstick twice per day. 03/18/19   Fleming, [name] W, NP  XARELTO 20 MG TABS tablet TAKE 1 TABLET BY MOUTH DAILY WITH SUPPER (BEDTIME) 07/14/20   Simmons, Brittainy M, PA-C    Past Medical History: Past Medical History:  Diagnosis Date  . A-fib (HCC)   . Anemia 05/29/2019  . CHF (congestive heart failure) (HCC)   . Chronic respiratory failure (HCC)   . Diabetes mellitus without complication (HCC)   . Dyspnea   . Elevated troponin 02/06/2019  . Hypertension   . Obesity   . Pulmonary embolism (HCC)   . Rectal bleeding 05/29/2019  . Sleep apnea     Past Surgical History: Past Surgical History:  Procedure Laterality Date  . COLONOSCOPY WITH PROPOFOL Left 02/25/2019   Procedure: COLONOSCOPY WITH PROPOFOL;  Surgeon: Outlaw, William, MD;  Location: [facility] ENDOS[facility];  Service: Endoscopy;  Laterality: Left;  .  ESOPHAGOGASTRODUODENOSCOPY (EGD) WITH PROPOFOL Left 02/25/2019   Procedure: ESOPHAGOGASTRODUODENOSCOPY (EGD) WITH PROPOFOL;  Surgeon: Outlaw, William, MD;  Location: [facility] ENDOSCOPY;  Service: Endoscopy;  Laterality: Left;  . ESOPHAGOGASTRODUODENOSCOPY (EGD) WITH PROPOFOL N/A 06/12/2019   Procedure: ESOPHAGOGASTRODUODENOSCOPY (EGD) WITH PROPOFOL;  Surgeon: Magod, Marc, MD;  Location: [facility] ENDOSCOPY;  Service: Gastroenterology;  Laterality: N/A;  .   FLEXIBLE SIGMOIDOSCOPY N/A 06/12/2019   Procedure: FLEXIBLE SIGMOIDOSCOPY;  Surgeon: Magod, Marc, MD;  Location: [facility] ENDOSCOPY;  Service: Gastroenterology;  Laterality: N/A;  . HEMORRHOID BANDING  05/2019  . NO PAST SURGERIES    . RIGHT HEART CATH N/A 04/17/2019   Procedure: RIGHT HEART CATH;  Surgeon: ,  S, MD;  Location: [facility] INVASIVE CV LAB;  Service: Cardiovascular;  Laterality: N/A;    Family History: Family History  Problem Relation Age of Onset  . Hypertension Mother   . Diabetes Mother     Social History: Social History   Socioeconomic History  . Marital status: Single    Spouse name: Not on file  . Number of children: Not on file  . Years of education: Not on file  . Highest education level: Not on file  Occupational History  . Not on file  Tobacco Use  . Smoking status: Former Smoker  . Smokeless tobacco: Never Used  . Tobacco comment: smoked weed in the past  Vaping Use  . Vaping Use: Never used  Substance and Sexual Activity  . Alcohol use: Not Currently  . Drug use: Not Currently    Types: Marijuana  . Sexual activity: Not Currently  Other Topics Concern  . Not on file  Social History Narrative  . Not on file   Social Determinants of Health   Financial Resource Strain: Medium Risk  . Difficulty of Paying Living Expenses: Somewhat hard  Food Insecurity: Food Insecurity Present  . Worried About Running Out of Food in the Last Year: Sometimes true  . Ran Out of Food in the Last Year: Sometimes true    Transportation Needs: No Transportation Needs  . Lack of Transportation (Medical): No  . Lack of Transportation (Non-Medical): No  Physical Activity:   . Days of Exercise per Week: Not on file  . Minutes of Exercise per Session: Not on file  Stress:   . Feeling of Stress : Not on file  Social Connections:   . Frequency of Communication with Friends and Family: Not on file  . Frequency of Social Gatherings with Friends and Family: Not on file  . Attends Religious Services: Not on file  . Active Member of Clubs or Organizations: Not on file  . Attends Club or Organization Meetings: Not on file  . Marital Status: Not on file    Allergies:  No Known Allergies  Objective:    Vital Signs:   Temp:  [97.4 F (36.3 C)] 97.4 F (36.3 C) (12/01 1003) Pulse Rate:  [38-176] 135 (12/01 1415) Resp:  [15-31] 16 (12/01 1415) BP: (101-170)/(71-116) 142/110 (12/01 1415) SpO2:  [91 %-96 %] 96 % (12/01 1415) Weight:  [161.9 kg] 161.9 kg (12/01 0957)    Weight change: Filed Weights   08/19/20 0957  Weight: (!) 161.9 kg    Intake/Output:   Intake/Output Summary (Last 24 hours) at 08/19/2020 1424 Last data filed at 08/19/2020 1214 Gross per 24 hour  Intake 5.86 ml  Output --  Net 5.86 ml      Physical Exam    General:  Obese middle aged male, No resp difficulty HEENT: normal Neck: supple. Thick neck JVD not well visualized . Carotids 2+ bilat; no bruits. No lymphadenopathy or thyromegaly appreciated. Cor: PMI nondisplaced. Irregullary irregular rhythm, tachy rate. No rubs, gallops or murmurs. Lungs: decreased BS at the bases bilaterally  Abdomen: soft, nontender, nondistended. No hepatosplenomegaly. No bruits or masses. Good bowel sounds. Extremities: no cyanosis, clubbing, rash, obese extremities, chronic   venous stasis dermatitis bilaterally edema Neuro: alert & orientedx3, cranial nerves grossly intact. moves all 4 extremities w/o difficulty. Affect pleasant   Telemetry    Atrial fibrillation w/ RVR 120s   EKG    Atrial fibrillation 119 bpm   Labs   Basic Metabolic Panel: Recent Labs  Lab 08/19/20 1018 08/19/20 1120  NA 138 137  K 3.1* 3.1*  CL 96*  --   CO2 27  --   GLUCOSE 140*  --   BUN 19  --   CREATININE 1.59*  --   CALCIUM 9.4  --     Liver Function Tests: Recent Labs  Lab 08/19/20 1018  AST 23  ALT 16  ALKPHOS 52  BILITOT 0.5  PROT 7.7  ALBUMIN 3.5   No results for input(s): LIPASE, AMYLASE in the last 168 hours. No results for input(s): AMMONIA in the last 168 hours.  CBC: Recent Labs  Lab 08/19/20 1018 08/19/20 1120  WBC 5.9  --   HGB 14.7 17.7*  HCT 50.2 52.0  MCV 76.2*  --   PLT 389  --     Cardiac Enzymes: No results for input(s): CKTOTAL, CKMB, CKMBINDEX, TROPONINI in the last 168 hours.  BNP: BNP (last 3 results) Recent Labs    08/22/19 1353 02/24/20 1501 08/19/20 1018  BNP 42.8 75.1 40.6    ProBNP (last 3 results) No results for input(s): PROBNP in the last 8760 hours.   CBG: No results for input(s): GLUCAP in the last 168 hours.  Coagulation Studies: No results for input(s): LABPROT, INR in the last 72 hours.   Imaging   DG Chest Port 1 View  Result Date: 08/19/2020 CLINICAL DATA:  Chest pain shortness of breath EXAM: PORTABLE CHEST 1 VIEW COMPARISON:  02/24/2020 FINDINGS: Trachea midline. Heart size remains enlarged with fullness of LEFT and RIGHT hilum as before. Heart size accentuated by portable technique. Increased interstitial markings throughout the chest. Small amount of fluid possible on the RIGHT minor fissure. No lobar consolidative changes. On limited assessment no acute skeletal process. IMPRESSION: Findings that favor heart failure and/or volume overload. Slight asymmetry could also be seen in the setting of atypical or viral infection. Electronically Signed   By: Geoffrey  Wile [name].[name].   On: 08/19/2020 10:39      Medications:     Current Medications: . potassium  chloride  40 mEq Oral Q2H     Infusions: . diltiazem (CARDIZEM) infusion 5 mg/hr (08/19/20 1214)       Assessment/Plan   1. Chronic diastolic CHF: Echo in 10/20 with EF 65-70%, RV interpreted as normal but suspect significant RV dysfunction.  - he has had multiple admissions in the past for a/c HF in the setting of poor compliance w/ meds and dietary indiscretion w/ sodium. Now being admitted for a/c CHF in the setting of rapid afib after drug binge w/ ETOH and cocaine.  - volume status assessment difficult due to body habitus. BNP only 40 (may be falsely low given obesity). CXR w/ no overt edema but he does complain of recent orthopnea/ PND.  - treat afib through rate control. If no spontaneous conversion, will need TEE guided DCCV (recently missed meds).  - Give a dose of IV Lasix in ED. Follow BMP   - Continue spironolactone 50 mg daily.  2. Paroxsymal Atrial fibrillation:  - in afib w/ RVR after drug binge w/ ETOH and cocaine.  - continue on Cardizem gtt for rate control  - restart Xarelto (  missed several days at home) - If no spontaneous conversion, will need TEE guided DCCV - supp K (3.1). check Mg level and supp if low  3. H/o PE: 5/20, continue Xarelto.  4. OHS/OSA: CPAP QHS  5. HTN: moderately elevated in ED - order home meds on admit  7. Polysubstance Use - check UDS 8. Situational Depression  - management per IM  9. Hypokalemia - K 3.1  - supp and follow BMP  10. AKI - SCr 1.59 on admit - Baseline 1.2-1.4, monitor  11. Chest Pain w/ mildly elevated Hs trop - hs trop 38>>86 - likely demand ischemia from rapid afib/ fluid overload and cocaine use - check 3rd troponin to ensure no significant bump  12. DM - on insulin. Management per IM   Length of Stay: 0  Brittainy Simmons, PA-C  08/19/2020, 2:24 PM  Advanced Heart Failure Team Pager [phone] ([name]-F; 7a - 4p)  Please contact [facility] for night-coverage after hours (4p -7a ) and weekends on  [email or web address]  Patient seen with PA, agree with the above note.   Patient was depressed around Thanksgiving (lost his mother this summer and had no one to spend holiday with).  He stopped his meds and drank liquor/used cocaine.    Today, he developed dyspnea and chest pressure, came to ER and was noted to be in atrial fibrillation with RVR.  He has been off his Xarelto for a few days.    He was placed on diltiazem gtt, HR now in 80s-90s.  He feels better, no chest pain.    Mild elevation in HS-TnI to 86.   General: NAD, obese Neck: JVP 10 cm, no thyromegaly or thyroid nodule.  Lungs: Clear to auscultation bilaterally with normal respiratory effort. CV: Nonpalpable PMI.  Heart irregular S1/S2, no S3/S4, no murmur.  No peripheral edema.  No carotid bruit.  Normal pedal pulses.  Abdomen: Soft, nontender, no hepatosplenomegaly, no distention.  Skin: Intact without lesions or rashes.  Neurologic: Alert and oriented x 3.  Psych: Normal affect. Extremities: No clubbing or cyanosis.  HEENT: Normal.   Atrial fibrillation with RVR (has history of PAF) in setting of ETOH and cocaine abuse over the weekend.  Also stopped all his meds.  - Agree with rate control with diltiazem gtt.  - Restart Xarelto today.  - If he remains in atrial fibrillation, plan TEE-guided DCCV on Friday.   Suspect mild volume overload, has been off his torsemide. Creatinine close to baseline.  - Lasix 80 mg IV x 1 today.  - Will likely resume torsemide 100 mg bid tomorrow.   Mild elevation in HS-TnI.  Suspect demand ischemia with afib/RVR and CHF, but will trend.    OHS/OSA: CPAP at night.     08/19/2020 5:40 PM  

## 2020-08-19 NOTE — Progress Notes (Signed)
Received critical  Lab value Trop 208.  [facility] Night Coverage Fransico Michael, [place] paged and Notified. Awaiting return call / orders. Mikal Plane, BSN, RN

## 2020-08-19 NOTE — ED Provider Notes (Signed)
[facility] EMERGENCY DEPARTMENT Provider Note   CSN: [id number] Arrival date & time: 08/19/20  2993     History Chief Complaint  Patient presents with  . Chest Pain  . Shortness of Breath    Benjamin [name] is a 54 y.o. male.  54 year old male with history of paroxysmal a-fib (on Xarelto), CHF (on Torsemide), DM, OSA, PE, brought in by EMS for difficulty breathing and CP today. Patient states he went out of town (to [place]) and forgot his medications, last had medications on Saturday (4 days ago), arrived home at [place] today and went to bed. Patient woke up at 7AM to use the bathroom and on his way back to bed developed difficulty breathing (was not on his 3L [place]). Patient crawled back to bed and put on his oxygen, developed mid sternal chest pressure and called 911. Patient was reported to be in a fib with a rate of 140-170, was given 21m Cardizem, ASA and nitro. Rate has improved to currently 100-120, continues to have mid sternal chest pressure which he states is manageable. Denies SHOB, states he couldn't breath due to being off his oxygen. Denies new leg swelling or pain. No other complaints or concerns.         Past Medical History:  Diagnosis Date  . A-fib (HFargo   . Anemia 05/29/2019  . CHF (congestive heart failure) (HTowaoc   . Chronic respiratory failure (HO[place]   . Diabetes mellitus without complication (HMishaw[place]   . Dyspnea   . Elevated troponin 02/06/2019  . Hypertension   . Obesity   . Pulmonary embolism (HRidgeside   . Rectal bleeding 05/29/2019  . Sleep apnea     Patient Active Problem List   Diagnosis Date Noted  . Obesity hypoventilation syndrome (HP[place]i 06/12/2020  . Healthcare maintenance 06/12/2020  . Acute on chronic diastolic heart failure (H[place] Sar[place] 02/24/2020  . Diabetic neuropathy (HCo[place] 11/20/2019  . Coagulation disorder (HL[place] 11/20/2019  . Acute on chronic diastolic CHF (congestive heart failure), NYHA class 3 (HAccord 07/30/2019  . Diabetes  mellitus without complication (H[place]ain 171/69/6789 . Hypertensive emergency   . BRBPR (bright red blood per rectum) 06/12/2019  . Hypertension   . CHF (congestive heart failure) (HD[place]   . Sleep apnea   . Chronic respiratory failure (HW[place]land Park   . Hypokalemia 05/29/2019  . GI bleed 05/29/2019  . Anemia 05/28/2019  . Acute on chronic diastolic CHF (congestive heart failure) (HLewes 04/11/2019  . Iron deficiency anemia 03/11/2019  . OSA on CPAP 03/11/2019  . HLD (hyperlipidemia) 03/11/2019  . Elevated troponin 03/11/2019  . GERD (gastroesophageal reflux disease) 03/11/2019  . Rectal bleeding 02/20/2019  . Dyspnea 02/06/2019  . COVID-19 virus infection 02/06/2019  . Bilateral lower extremity edema   . Morbid obesity with BMI of 50.0-59.9, adult (HFallon   . Chronic diastolic CHF (congestive heart failure) (HO[place]   . PAF (paroxysmal atrial fibrillation) (HArdoch   . PE (pulmonary thromboembolism) (HBoiling Springs 01/21/2019  . Hypertensive urgency 01/21/2019  . Diabetes mellitus type 2 in obese (H[place] 01/21/2019    Past Surgical History:  Procedure Laterality Date  . COLONOSCOPY WITH PROPOFOL Left 02/25/2019   Procedure: COLONOSCOPY WITH PROPOFOL;  Surgeon: OArta Silence MD;  [place] [facility]  Service: Endoscopy;  Laterality: Left;  . ESOPHAGOGASTRODUODENOSCOPY (EGD) WITH PROPOFOL Left 02/25/2019   Procedure: ESOPHAGOGASTRODUODENOSCOPY (EGD) WITH PROPOFOL;  Surgeon: OArta Silence MD;  [facility]  Service: Endoscopy;  Laterality: Left;  . ESOPHAGOGASTRODUODENOSCOPY (EGD) WITH PROPOFOL  N/A 06/12/2019   Procedure: ESOPHAGOGASTRODUODENOSCOPY (EGD) WITH PROPOFOL;  Surgeon: Clarene Essex, MD;  Location: [place];  Service: Gastroenterology;  Laterality: N/A;  . FLEXIBLE SIGMOIDOSCOPY N/A 06/12/2019   Procedure: FLEXIBLE SIGMOIDOSCOPY;  Surgeon: Clarene Essex, MD;  Location: [place];  Service: Gastroenterology;  Laterality: N/A;  . HEMORRHOID BANDING  05/2019  . NO PAST SURGERIES    . RIGHT  HEART CATH N/A 04/17/2019   Procedure: RIGHT HEART CATH;  Surgeon: Larey Dresser, MD;  Location: [place] [facility];  Service: Cardiovascular;  Laterality: N/A;       Family History  Problem Relation Age of Onset  . Hypertension Mother   . Diabetes Mother     Social History   Tobacco Use  . Smoking status: Former Research scientist (life sciences)  . Smokeless tobacco: Never Used  . Tobacco comment: smoked weed in the past  Vaping Use  . Vaping Use: Never used  Substance Use Topics  . Alcohol use: Not Currently  . Drug use: Not Currently    Types: Marijuana    Home Medications Prior to Admission medications   Medication Sig Start Date End Date Taking? Authorizing Provider  amLODipine (NORVASC) 10 MG tablet TAKE ONE TABLET BY MOUTH ONCE DAILY 04/08/20   [name], NP  atorvastatin (LIPITOR) 40 MG tablet Take 1 tablet (40 mg total) by mouth at bedtime. 01/20/20 04/20/20  Clegg, Amy D, NP  Blood Glucose Monitoring Suppl (TRUE METRIX METER) w/Device KIT Use as instructed. Check blood glucose level by fingerstick twice per day. Patient taking differently: 1 each by Other route See admin instructions. Use as instructed. Check blood glucose level by fingerstick twice per day. 03/18/19   [name], NP  Blood Pressure Monitor DEVI Please provide patient with insurance approved blood pressure device with L-XL cuff. BMI 55 Patient taking differently: 1 each by Other route See admin instructions. Please provide patient with insurance approved blood pressure device with L-XL cuff. BMI 55 10/04/19   [name], NP  carboxymethylcellulose (REFRESH PLUS) 0.5 % SOLN Place 1 drop into both eyes daily.     [provider]  docusate sodium (COLACE) 100 MG capsule Take 1 capsule (100 mg total) by mouth 2 (two) times daily. 05/22/20 08/20/20  [name], NP  doxazosin (CARDURA) 2 MG tablet Take 1 tablet (2 mg total) by mouth daily. 07/02/20 07/02/21  Lyda Jester M, PA-C  ferrous sulfate 325 (65  FE) MG tablet Take 1 tablet (325 mg total) by mouth daily. 05/22/20 08/20/20  [name], NP  glucose blood (TRUE METRIX BLOOD GLUCOSE TEST) test strip Use as instructed. Check blood glucose level by fingerstick twice per day. Patient taking differently: 1 each by Other route See admin instructions. Use as instructed. Check blood glucose level by fingerstick twice per day. 03/18/19   [name], NP  labetalol (NORMODYNE) 200 MG tablet TAKE 2 TABLETS (400 MG TOTAL) BY MOUTH 2 (TWO) TIMES DAILY. 04/20/20   [name], MD  losartan (COZAAR) 100 MG tablet Take 1 tablet (100 mg total) by mouth daily. 05/04/20   Clegg, Amy D, NP  magnesium oxide (MAG-OX) 400 (241.3 Mg) MG tablet TAKE 2 TABLETS BY MOUTH EVERY MORNING AND 1 TABLET EVERY EVENING (AM+NOON+BEDTIME) 05/27/20   Larey Dresser, MD  metFORMIN (GLUCOPHAGE) 500 MG tablet TAKE ONE TABLET BY MOUTH TWICE DAILY WITH A MEAL (NOON+BEDTIME) 05/26/20   [name], NP  methocarbamol (ROBAXIN) 500 MG tablet Take 1 tablet (  500 mg total) by mouth every 8 (eight) hours as needed for muscle spasms. 04/09/20   [name], NP  metolazone (ZAROXOLYN) 5 MG tablet Take 1 tablet (5 mg total) by mouth See admin instructions. Take 1 tablet every Monday, Wednesday, and Friday. 05/27/20   Larey Dresser, MD  Misc. Devices MISC Requires O2 @ 3L/min continuously via nasal canula and home fill system,. Patient taking differently: 1 each by Other route See admin instructions. Requires O2 @ 3L/min continuously via nasal canula and home fill system,. 07/03/19   [name], NP  Misc. Devices MISC Please provide Mr. Saha with insurance approved portable O2 concentrator ICD 10 J96.11 Z99.81 Patient taking differently: 1 each by Other route See admin instructions. Please provide Mr. Johanson with insurance approved portable O2 concentrator ICD 10 J96.11 Z99.81 08/27/19   [name], NP  Misc. Devices MISC Please provide patient with XL sized insurance  approved compression stockings. ICD 10 R60.0 and  I50.32 04/19/20   [name], NP  omeprazole (PRILOSEC) 20 MG capsule TAKE 1 CAPSULE (20 MG TOTAL) BY MOUTH 2 (TWO) TIMES DAILY BEFORE A MEAL. TAKE FIRST CAPSULE 30 MIN PRIOR TO EATING OR TAKING OTHER MEDICATIONS. 04/08/20 06/22/20  [name], NP  potassium chloride (KLOR-CON) 10 MEQ tablet TAKE 60 MEQ (6 TABS) IN THE MORNING, 60 MEQ (6 TABS) IN THE AFTERNOON AND 40 MEQ (4 TABS) IN THE EVENING. TAKE AN EXTRA 4 TABS EVERY DOSE OF METOLAZONE 05/26/20   Clegg, Amy D, NP  PROVENTIL HFA 108 (90 Base) MCG/ACT inhaler INHALE 2 PUFFS BY MOUTH EVERY 6 (SIX) HOURS AS NEEDED FOR WHEEZING OR SHORTNESS OF BREATH. 03/05/20   Charlott Rakes, MD  spironolactone (ALDACTONE) 50 MG tablet TAKE 1 TABLET (50 MG TOTAL) BY MOUTH DAILY. 03/16/20   Clegg, Amy D, NP  torsemide (DEMADEX) 100 MG tablet TAKE ONE TABLET BY MOUTH TWICE DAILY (NOON+BEDTIME) 07/06/20   Larey Dresser, MD  TRUEplus Lancets 28G MISC Use as instructed. Check blood glucose level by fingerstick twice per day. Patient taking differently: 1 each by Other route See admin instructions. Use as instructed. Check blood glucose level by fingerstick twice per day. 03/18/19   [name], NP  XARELTO 20 MG TABS tablet TAKE 1 TABLET BY MOUTH DAILY WITH SUPPER (BEDTIME) 07/14/20   Consuelo Pandy, PA-C    Allergies    Patient has no known allergies.  Review of Systems   Review of Systems  Constitutional: Negative for chills, diaphoresis and fever.  Respiratory: Negative for shortness of breath.   Cardiovascular: Positive for chest pain. Negative for palpitations and leg swelling.  Gastrointestinal: Negative for nausea and vomiting.  Musculoskeletal: Negative for arthralgias and myalgias.  Skin: Negative for wound.  Allergic/Immunologic: Positive for immunocompromised state.  Neurological: Negative for weakness and headaches.  Psychiatric/Behavioral: Negative for confusion.  All other systems  reviewed and are negative.   Physical Exam Updated Vital Signs BP (!) 152/115   Pulse (!) 38   Temp (!) 97.4 F (36.3 C) (Oral)   Resp 20   Ht 5' 10"  (1.778 m)   Wt (!) 161.9 kg   SpO2 92%   BMI 51.22 kg/m   Physical Exam Vitals and nursing note reviewed.  Constitutional:      General: He is not in acute distress.    Appearance: He is well-developed. He is obese. He is not diaphoretic.     Comments: Alert but falls asleep with pauses in  conversation   HENT:     Head: Normocephalic and atraumatic.  Cardiovascular:     Rate and Rhythm: Tachycardia present. Rhythm irregular.     Heart sounds: Normal heart sounds.  Pulmonary:     Effort: Pulmonary effort is normal.     Breath sounds: Normal breath sounds.  Abdominal:     Palpations: Abdomen is soft.     Tenderness: There is no abdominal tenderness.  Musculoskeletal:     Right lower leg: No edema.     Left lower leg: No edema.  Skin:    General: Skin is warm and dry.     Findings: No erythema or rash.  Neurological:     Mental Status: He is alert and oriented to person, place, and time.  Psychiatric:        Behavior: Behavior normal.     ED Results / Procedures / Treatments   Labs (all labs ordered are listed, but only abnormal results are displayed) Labs Reviewed  CBC - Abnormal; Notable for the following components:      Result Value   RBC 6.59 (*)    MCV 76.2 (*)    MCH 22.3 (*)    MCHC 29.3 (*)    RDW 20.1 (*)    All other components within normal limits  COMPREHENSIVE METABOLIC PANEL - Abnormal; Notable for the following components:   Potassium 3.1 (*)    Chloride 96 (*)    Glucose, Bld 140 (*)    Creatinine, Ser 1.59 (*)    GFR, Estimated 51 (*)    All other components within normal limits  I-STAT ARTERIAL BLOOD GAS, ED - Abnormal; Notable for the following components:   pCO2 arterial 55.3 (*)    pO2, Arterial 76 (*)    Bicarbonate 34.6 (*)    TCO2 36 (*)    Acid-Base Excess 8.0 (*)    Potassium  3.1 (*)    Hemoglobin 17.7 (*)    All other components within normal limits  TROPONIN I (HIGH SENSITIVITY) - Abnormal; Notable for the following components:   Troponin I (High Sensitivity) 38 (*)    All other components within normal limits  BRAIN NATRIURETIC PEPTIDE  TROPONIN I (HIGH SENSITIVITY)    EKG EKG Interpretation  Date/Time:  Wednesday August 19 2020 10:02:31 EST Ventricular Rate:  119 PR Interval:    QRS Duration: 98 QT Interval:  349 QTC Calculation: 491 R Axis:   79 Text Interpretation: Atrial fibrillation Probable left ventricular hypertrophy ST elevation, consider anterior injury Borderline prolonged QT interval Confirmed by Fredia Sorrow [id number]) on 08/19/2020 10:14:52 AM   Radiology DG Chest Port 1 View  Result Date: 08/19/2020 CLINICAL DATA:  Chest pain shortness of breath EXAM: PORTABLE CHEST 1 VIEW COMPARISON:  02/24/2020 FINDINGS: Trachea midline. Heart size remains enlarged with fullness of LEFT and RIGHT hilum as before. Heart size accentuated by portable technique. Increased interstitial markings throughout the chest. Small amount of fluid possible on the RIGHT minor fissure. No lobar consolidative changes. On limited assessment no acute skeletal process. IMPRESSION: Findings that favor heart failure and/or volume overload. Slight asymmetry could also be seen in the setting of atypical or viral infection. Electronically Signed   By: Zetta Bills M.D.   On: 08/19/2020 10:39    Procedures .Critical Care Performed by: Tacy Learn, PA-C Authorized by: Tacy Learn, PA-C   Critical care provider statement:    Critical care time (minutes):  45   Critical care was time spent  personally by me on the following activities:  Discussions with consultants, evaluation of patient's response to treatment, examination of patient, ordering and performing treatments and interventions, ordering and review of laboratory studies, ordering and review of radiographic  studies, pulse oximetry, re-evaluation of patient's condition, obtaining history from patient or surrogate and review of old charts   (including critical care time)  Medications Ordered in ED Medications  diltiazem (CARDIZEM) 125 mg in dextrose 5% 125 mL (1 mg/mL) infusion (5 mg/hr Intravenous Rate/Dose Verify 08/19/20 1214)    ED Course  I have reviewed the triage vital signs and the nursing notes.  Pertinent labs & imaging results that were available during my care of the patient were reviewed by me and considered in my medical decision making (see chart for details).  Clinical Course as of Aug 20 1335  Wed Aug 19, 3270  1762 54 year old male brought in by EMS for difficulty breathing and chest pain today.  On EMS arrival, found to have A. fib with a rate of 140-170, was given 20 mg of Cardizem with improvement in rate to the 120s.  Rate returned to 157 and patient was started on Cardizem drip with rate improvement to 115. Initial troponin 38, similar to prior, repeat trop pending.  CBC and CMP without significant changes from prior.  ABG obtained due to patient falling asleep easily, has been without his CPAP for the past few days, no significant retention found.    [LM]  1749 Discussed with Wannetta Sender, RN with cardiology. Cardiology will see patient, requests medicine to admit.    [LM]  1335 Discussed with Dr. Roosevelt Locks with [facility] who will consult for admission.    [LM]    Clinical Course User Index [LM] Roque Lias   MDM Rules/Calculators/A&P                          Final Clinical Impression(s) / ED Diagnoses Final diagnoses:  Atrial fibrillation with rapid ventricular response ([place])  Chest pain, unspecified type    Rx / DC Orders ED Discharge Orders    None       Tacy Learn, PA-C 08/19/20 1336    Fredia Sorrow, MD 08/25/20 1517

## 2020-08-20 ENCOUNTER — Inpatient Hospital Stay (HOSPITAL_COMMUNITY): Payer: Medicaid Other

## 2020-08-20 DIAGNOSIS — Z6841 Body Mass Index (BMI) 40.0 and over, adult: Secondary | ICD-10-CM

## 2020-08-20 DIAGNOSIS — I5031 Acute diastolic (congestive) heart failure: Secondary | ICD-10-CM

## 2020-08-20 DIAGNOSIS — I10 Essential (primary) hypertension: Secondary | ICD-10-CM

## 2020-08-20 DIAGNOSIS — Z9989 Dependence on other enabling machines and devices: Secondary | ICD-10-CM

## 2020-08-20 DIAGNOSIS — G4733 Obstructive sleep apnea (adult) (pediatric): Secondary | ICD-10-CM

## 2020-08-20 DIAGNOSIS — F1010 Alcohol abuse, uncomplicated: Secondary | ICD-10-CM | POA: Diagnosis present

## 2020-08-20 DIAGNOSIS — I5033 Acute on chronic diastolic (congestive) heart failure: Secondary | ICD-10-CM | POA: Diagnosis not present

## 2020-08-20 DIAGNOSIS — I4891 Unspecified atrial fibrillation: Secondary | ICD-10-CM | POA: Diagnosis not present

## 2020-08-20 DIAGNOSIS — I4811 Longstanding persistent atrial fibrillation: Secondary | ICD-10-CM

## 2020-08-20 DIAGNOSIS — F1990 Other psychoactive substance use, unspecified, uncomplicated: Secondary | ICD-10-CM | POA: Diagnosis present

## 2020-08-20 DIAGNOSIS — F1910 Other psychoactive substance abuse, uncomplicated: Secondary | ICD-10-CM | POA: Diagnosis present

## 2020-08-20 LAB — CBC
HCT: 47.2 % (ref 39.0–52.0)
Hemoglobin: 14 g/dL (ref 13.0–17.0)
MCH: 22.4 pg — ABNORMAL LOW (ref 26.0–34.0)
MCHC: 29.7 g/dL — ABNORMAL LOW (ref 30.0–36.0)
MCV: 75.4 fL — ABNORMAL LOW (ref 80.0–100.0)
Platelets: 396 10*3/uL (ref 150–400)
RBC: 6.26 MIL/uL — ABNORMAL HIGH (ref 4.22–5.81)
RDW: 19.7 % — ABNORMAL HIGH (ref 11.5–15.5)
WBC: 10 10*3/uL (ref 4.0–10.5)
nRBC: 0 % (ref 0.0–0.2)

## 2020-08-20 LAB — COMPREHENSIVE METABOLIC PANEL
ALT: 14 U/L (ref 0–44)
AST: 17 U/L (ref 15–41)
Albumin: 3.2 g/dL — ABNORMAL LOW (ref 3.5–5.0)
Alkaline Phosphatase: 45 U/L (ref 38–126)
Anion gap: 12 (ref 5–15)
BUN: 39 mg/dL — ABNORMAL HIGH (ref 6–20)
CO2: 31 mmol/L (ref 22–32)
Calcium: 8.7 mg/dL — ABNORMAL LOW (ref 8.9–10.3)
Chloride: 91 mmol/L — ABNORMAL LOW (ref 98–111)
Creatinine, Ser: 2.6 mg/dL — ABNORMAL HIGH (ref 0.61–1.24)
GFR, Estimated: 28 mL/min — ABNORMAL LOW (ref >60)
Glucose, Bld: 126 mg/dL — ABNORMAL HIGH (ref 70–99)
Potassium: 3.1 mmol/L — ABNORMAL LOW (ref 3.5–5.1)
Sodium: 134 mmol/L — ABNORMAL LOW (ref 135–145)
Total Bilirubin: 0.5 mg/dL (ref 0.3–1.2)
Total Protein: 7.2 g/dL (ref 6.5–8.1)

## 2020-08-20 LAB — GLUCOSE, CAPILLARY
Glucose-Capillary: 140 mg/dL — ABNORMAL HIGH (ref 70–99)
Glucose-Capillary: 100 mg/dL — ABNORMAL HIGH (ref 70–99)
Glucose-Capillary: 178 mg/dL — ABNORMAL HIGH (ref 70–99)
Glucose-Capillary: 165 mg/dL — ABNORMAL HIGH (ref 70–99)

## 2020-08-20 LAB — ECHOCARDIOGRAM COMPLETE
Height: 71 in
S' Lateral: 3 cm
Weight: 5497.6 oz

## 2020-08-20 LAB — BASIC METABOLIC PANEL
Anion gap: 13 (ref 5–15)
BUN: 26 mg/dL — ABNORMAL HIGH (ref 6–20)
CO2: 33 mmol/L — ABNORMAL HIGH (ref 22–32)
Calcium: 8.8 mg/dL — ABNORMAL LOW (ref 8.9–10.3)
Chloride: 90 mmol/L — ABNORMAL LOW (ref 98–111)
Creatinine, Ser: 2.04 mg/dL — ABNORMAL HIGH (ref 0.61–1.24)
GFR, Estimated: 38 mL/min — ABNORMAL LOW (ref >60)
Glucose, Bld: 121 mg/dL — ABNORMAL HIGH (ref 70–99)
Potassium: 3.3 mmol/L — ABNORMAL LOW (ref 3.5–5.1)
Sodium: 136 mmol/L (ref 135–145)

## 2020-08-20 LAB — HIV ANTIBODY (ROUTINE TESTING W REFLEX): HIV Screen 4th Generation wRfx: NONREACTIVE

## 2020-08-20 LAB — ETHANOL: Alcohol, Ethyl (B): <10 mg/dL (ref <10)

## 2020-08-20 LAB — MAGNESIUM: Magnesium: 2.1 mg/dL (ref 1.7–2.4)

## 2020-08-20 LAB — PHOSPHORUS: Phosphorus: 5.2 mg/dL — ABNORMAL HIGH (ref 2.5–4.6)

## 2020-08-20 MED ORDER — POTASSIUM CHLORIDE CRYS ER 20 MEQ PO TBCR
40.0000 meq | EXTENDED_RELEASE_TABLET | Freq: Once | ORAL | Status: AC
  Administered 2020-08-20: 40 meq via ORAL
  Filled 2020-08-20: qty 2

## 2020-08-20 MED ORDER — LORAZEPAM 1 MG PO TABS: 1.0000 mg | ORAL_TABLET | ORAL | Status: AC | PRN

## 2020-08-20 MED ORDER — LORAZEPAM 2 MG/ML IJ SOLN: 1.0000 mg | INTRAMUSCULAR | Status: AC | PRN

## 2020-08-20 MED ORDER — THIAMINE HCL 100 MG/ML IJ SOLN
100.0000 mg | Freq: Every day | INTRAMUSCULAR | Status: DC
  Filled 2020-08-20: qty 2

## 2020-08-20 MED ORDER — ADULT MULTIVITAMIN W/MINERALS CH
1.0000 | ORAL_TABLET | Freq: Every day | ORAL | Status: DC
  Administered 2020-08-20 – 2020-08-24 (×5): 1 via ORAL
  Filled 2020-08-20 (×5): qty 1

## 2020-08-20 MED ORDER — MAGNESIUM SULFATE 2 GM/50ML IV SOLN
2.0000 g | Freq: Once | INTRAVENOUS | Status: AC
  Administered 2020-08-20: 2 g via INTRAVENOUS
  Filled 2020-08-20: qty 50

## 2020-08-20 MED ORDER — THIAMINE HCL 100 MG PO TABS
100.0000 mg | ORAL_TABLET | Freq: Every day | ORAL | Status: DC
  Administered 2020-08-20 – 2020-08-24 (×5): 100 mg via ORAL
  Filled 2020-08-20 (×5): qty 1

## 2020-08-20 MED ORDER — SODIUM CHLORIDE 0.9 % IV SOLN: INTRAVENOUS | Status: DC

## 2020-08-20 MED ORDER — FOLIC ACID 1 MG PO TABS
1.0000 mg | ORAL_TABLET | Freq: Every day | ORAL | Status: DC
  Administered 2020-08-20 – 2020-08-24 (×5): 1 mg via ORAL
  Filled 2020-08-20 (×5): qty 1

## 2020-08-20 MED ORDER — MAGNESIUM SULFATE 2 GM/50ML IV SOLN
2.0000 g | Freq: Once | INTRAVENOUS | Status: DC
  Filled 2020-08-20: qty 50

## 2020-08-20 MED ORDER — PERFLUTREN LIPID MICROSPHERE
1.0000 mL | INTRAVENOUS | Status: AC | PRN
  Administered 2020-08-20: 4 mL via INTRAVENOUS
  Filled 2020-08-20: qty 10

## 2020-08-20 NOTE — H&P (View-Only) (Signed)
Advanced Heart Failure Rounding Note  PCP-Cardiologist: Dietrich Pates, MD   Subjective:    SCr up after dose of IV Lasix yesterday, 1.59>>2.04. BUN 26.  K 3.3.  Mg 1.7   Hs trop 38>>86>>208>>288>>339. 2D echo shows normal LVEF, no RWMAs. He has had recent SSCP.   He remains Afib today but rate better in the 80s- 90s. BP soft in 90s systolic.   UDS + for cocaine.    Objective:   Weight Range: (!) 155.9 kg Body mass index is 47.92 kg/m.   Vital Signs:   Temp:  [97.7 F (36.5 C)-98.8 F (37.1 C)] 97.9 F (36.6 C) (12/02 1243) Pulse Rate:  [72-108] 103 (12/02 1243) Resp:  [14-23] 20 (12/02 1243) BP: (94-162)/(65-107) 94/65 (12/02 1243) SpO2:  [89 %-100 %] 96 % (12/02 1243) Weight:  [155.9 kg-156.6 kg] 155.9 kg (12/02 0450)    Weight change: Filed Weights   08/19/20 0957 08/19/20 1520 08/20/20 0450  Weight: (!) 161.9 kg (!) 156.6 kg (!) 155.9 kg    Intake/Output:   Intake/Output Summary (Last 24 hours) at 08/20/2020 1417 Last data filed at 08/20/2020 1100 Gross per 24 hour  Intake 1356 ml  Output 2050 ml  Net -694 ml      Physical Exam    General:  Well appearing, obese male. No resp difficulty HEENT: Normal Neck: Supple.  No JVP . Carotids 2+ bilat; no bruits. No lymphadenopathy or thyromegaly appreciated. Cor: PMI nondisplaced. Irregularly irregular rhythm and rate. No rubs, gallops or murmurs. Lungs: Clear Abdomen: obese, Soft, nontender, nondistended. No hepatosplenomegaly. No bruits or masses. Good bowel sounds. Extremities: No cyanosis, clubbing, rash, edema Neuro: Alert & orientedx3, cranial nerves grossly intact. moves all 4 extremities w/o difficulty. Affect pleasant   Telemetry   Atrial fibrillation 80s-90s   EKG    Not performed   Labs    CBC Recent Labs    08/19/20 1018 08/19/20 1120  WBC 5.9  --   HGB 14.7 17.7*  HCT 50.2 52.0  MCV 76.2*  --   PLT 389  --    Basic Metabolic Panel Recent Labs    37/62/83 1018  08/19/20 1018 08/19/20 1120 08/19/20 1617 08/20/20 0736  NA 138   < > 137  --  136  K 3.1*   < > 3.1*  --  3.3*  CL 96*  --   --   --  90*  CO2 27  --   --   --  33*  GLUCOSE 140*  --   --   --  121*  BUN 19  --   --   --  26*  CREATININE 1.59*  --   --   --  2.04*  CALCIUM 9.4  --   --   --  8.8*  MG  --   --   --  1.7  --    < > = values in this interval not displayed.   Liver Function Tests Recent Labs    08/19/20 1018  AST 23  ALT 16  ALKPHOS 52  BILITOT 0.5  PROT 7.7  ALBUMIN 3.5   No results for input(s): LIPASE, AMYLASE in the last 72 hours. Cardiac Enzymes No results for input(s): CKTOTAL, CKMB, CKMBINDEX, TROPONINI in the last 72 hours.  BNP: BNP (last 3 results) Recent Labs    08/22/19 1353 02/24/20 1501 08/19/20 1018  BNP 42.8 75.1 40.6    ProBNP (last 3 results) No results for input(s): PROBNP in  the last 8760 hours.   D-Dimer No results for input(s): DDIMER in the last 72 hours. Hemoglobin A1C Recent Labs    08/19/20 1617  HGBA1C 6.1*   Fasting Lipid Panel No results for input(s): CHOL, HDL, LDLCALC, TRIG, CHOLHDL, LDLDIRECT in the last 72 hours. Thyroid Function Tests No results for input(s): TSH, T4TOTAL, T3FREE, THYROIDAB in the last 72 hours.  Invalid input(s): FREET3  Other results:   Imaging    ECHOCARDIOGRAM COMPLETE  Result Date: 08/20/2020    ECHOCARDIOGRAM REPORT   Patient Name:   Benjamin [name] Date of Exam: 08/20/2020 Medical Rec #:  [id number]          Height:       71.0 in Accession #:    [id number]         Weight:       343.6 lb Date of Birth:  05/11/1966          BSA:          2.654 m Patient Age:    54 years           BP:           118/60 mmHg Patient Gender: M                  HR:           82 bpm. Exam Location:  Inpatient Procedure: 2D Echo, Color Doppler, Cardiac Doppler and Intracardiac            Opacification Agent Indications:    I50.31 Acute diastolic (congestive) heart failure  History:        Patient has  prior history of Echocardiogram examinations, most                 recent 07/17/2019. CHF, Arrythmias:Atrial Fibrillation; Risk                 Factors:Hypertension, Diabetes, Dyslipidemia and Sleep Apnea.  Sonographer:    Irving Burton Senior RDCS Referring Phys: [id number] Roxy Horseman SIMMONS  Sonographer Comments: Technically difficult study due to poor echo windows. IMPRESSIONS  1. Left ventricular ejection fraction, by estimation, is 60 to 65%. The left ventricle has normal function. The left ventricle has no regional wall motion abnormalities. There is severe left ventricular hypertrophy. Left ventricular diastolic function could not be evaluated.  2. Right ventricular systolic function is normal. The right ventricular size is normal.  3. The mitral valve is normal in structure. No evidence of mitral valve regurgitation. No evidence of mitral stenosis.  4. The aortic valve has an indeterminant number of cusps. Aortic valve regurgitation is not visualized. No aortic stenosis is present.  5. Aortic dilatation noted. There is mild dilatation of the aortic root, measuring 38 mm.  6. The inferior vena cava is normal in size with greater than 50% respiratory variability, suggesting right atrial pressure of 3 mmHg. FINDINGS  Left Ventricle: Left ventricular ejection fraction, by estimation, is 60 to 65%. The left ventricle has normal function. The left ventricle has no regional wall motion abnormalities. Definity contrast agent was given IV to delineate the left ventricular  endocardial borders. The left ventricular internal cavity size was normal in size. There is severe left ventricular hypertrophy. Left ventricular diastolic function could not be evaluated due to atrial fibrillation. Left ventricular diastolic function could not be evaluated. Right Ventricle: The right ventricular size is normal. Right ventricular systolic function is normal. Left Atrium: Left atrial size was normal in size. Right  Atrium: Right atrial size was  normal in size. Pericardium: There is no evidence of pericardial effusion. Mitral Valve: The mitral valve is normal in structure. No evidence of mitral valve regurgitation. No evidence of mitral valve stenosis. Tricuspid Valve: The tricuspid valve is normal in structure. Tricuspid valve regurgitation is not demonstrated. No evidence of tricuspid stenosis. Aortic Valve: The aortic valve has an indeterminant number of cusps. Aortic valve regurgitation is not visualized. No aortic stenosis is present. Pulmonic Valve: The pulmonic valve was not well visualized. Pulmonic valve regurgitation is not visualized. No evidence of pulmonic stenosis. Aorta: Aortic dilatation noted. There is mild dilatation of the aortic root, measuring 38 mm. Venous: The inferior vena cava is normal in size with greater than 50% respiratory variability, suggesting right atrial pressure of 3 mmHg.  LEFT VENTRICLE PLAX 2D LVIDd:         4.20 cm LVIDs:         3.00 cm LV PW:         1.50 cm LV IVS:        1.80 cm LVOT diam:     2.40 cm LV SV:         68 LV SV Index:   26 LVOT Area:     4.52 cm  RIGHT VENTRICLE RV S prime:     11.10 cm/s TAPSE (M-mode): 1.8 cm LEFT ATRIUM             Index       RIGHT ATRIUM           Index LA diam:        4.00 cm 1.51 cm/m  RA Area:     18.70 cm LA Vol (A2C):   76.3 ml 28.75 ml/m RA Volume:   47.60 ml  17.94 ml/m LA Vol (A4C):   73.1 ml 27.55 ml/m LA Biplane Vol: 78.2 ml 29.47 ml/m  AORTIC VALVE LVOT Vmax:   106.15 cm/s LVOT Vmean:  72.500 cm/s LVOT VTI:    0.151 m  AORTA Ao Root diam: 3.80 cm Ao Asc diam:  3.30 cm  SHUNTS Systemic VTI:  0.15 m Systemic Diam: 2.40 cm Olga Millers MD Electronically signed by Olga Millers MD Signature Date/Time: 08/20/2020/1:16:39 PM    Final       Medications:     Scheduled Medications: . albuterol  2.5 mg Inhalation Q6H  . atorvastatin  40 mg Oral QHS  . docusate sodium  100 mg Oral BID  . doxazosin  2 mg Oral Daily  . ferrous sulfate  325 mg Oral Daily  .  hydroxypropyl methylcellulose / hypromellose  1 drop Both Eyes Daily  . insulin aspart  0-15 Units Subcutaneous TID WC  . labetalol  400 mg Oral q12n4p  . losartan  100 mg Oral QHS  . magnesium oxide  400 mg Oral Daily  . pantoprazole  40 mg Oral Daily  . potassium chloride  60 mEq Oral Daily  . rivaroxaban  20 mg Oral Q supper  . spironolactone  50 mg Oral Daily  . torsemide  100 mg Oral BID     Infusions: . diltiazem (CARDIZEM) infusion 10 mg/hr (08/20/20 1100)     PRN Medications:  acetaminophen, methocarbamol, ondansetron (ZOFRAN) IV   Assessment/Plan   1. Chronic diastolic CHF: Echo in 10/20 with EF 65-70%, RV interpreted as normal but suspect significant RV dysfunction.  - he has had multiple admissions in the past for a/c HF in the setting of poor compliance  w/ meds and dietary indiscretion w/ sodium. Now being admitted for a/c CHF in the setting of rapid afib after drug binge w/ ETOH and cocaine.  - suspect over diuresis w/ IV Lasix. SCr/BUN up. Hold diuretics today. Repeat BMP in AM.  - treat afib through rate control. If no spontaneous conversion, will need TEE guided DCCV (recently missed meds).  - Continue spironolactone 50 mg daily. - hold torsemide tonight 2. Paroxsymal Atrial fibrillation:  - presented w afib w/ RVR after drug binge w/ ETOH and cocaine.  - rates better today in 80s-90s - continue on Cardizem gtt for rate control  - restart Xarelto (missed several days at home) - If no spontaneous conversion, will need TEE guided DCCV - supp K (3.3).   3. H/o PE: 5/20, continue Xarelto.  4. OHS/OSA:CPAP QHS  5. HTN: BP today, suspect over diuresis - may need gentle IVF hydration  7. Polysubstance Use -  UDS + for cocaine and THC  8. Situational Depression  - management per IM  9. Hypokalemia/ Hypomagnesemia  - K 3.3 - Mg 1.7  - supp and follow BMP  10. AKI - SCr 1.59 on admit, bumped to 2.0 today - suspect overdiuresis. Hold diuretics today - may  need to give back gentle IVFs - Baseline 1.2-1.4, monitor, repeat BMP in am  11. Chest Pain w/ mildly elevated Hs trop - hs trop 38>>86>>208>>288>>339 - 2D echo shows normal EF and no RWMAs - likely demand ischemia from rapid afib/ fluid overload and cocaine use however given trend, may need LHC once AKI resolves vs 2 day nuclear stress test  12. DM - on insulin. Management per IM   Length of Stay: 1  Brittainy Simmons, PA-C  08/20/2020, 2:17 PM  Advanced Heart Failure Team Pager [phone] (M-F; 7a - 4p)  Please contact [facility] for night-coverage after hours (4p -7a ) and weekends on [email or web address]  Patient seen with PA, agree with the above note.    He remains in atrial fibrillation today, rate is controlled on diltiazem gtt.   He got IV Lasix yesterday, creatinine up to 2.04 today.    HS-TnI elevated to 339.   Echo reviewed, EF 60-65%, RV normal, IVC normal.   General: NAD, obese Neck: No JVD, no thyromegaly or thyroid nodule.  Lungs: Clear to auscultation bilaterally with normal respiratory effort. CV: Nondisplaced PMI.  Heart regular S1/S2, no S3/S4, no murmur.  No peripheral edema.   Abdomen: Soft, nontender, no hepatosplenomegaly, no distention.  Skin: Intact without lesions or rashes.  Neurologic: Alert and oriented x 3.  Psych: Normal affect. Extremities: No clubbing or cyanosis.  HEENT: Normal.   He has been started back on Xarelto, 2nd dose today.  Will plan TEE-guided DCCV tomorrow.  Discussed risks/benefits with patient and he agrees to proceed.   Based on exam and echo (small IVC), do not think he is volume overloaded.  Creatinine up to 2.04, likely over-diuresed.  Hold diuretics, spironolactone, and losartan for now.   Mild elevation in troponin.  May be due to demand ischemia with afib/RVR and cocaine use.  He has had some chest tightness.  Will set up for Cardiolite, will need to be 2 day study.   Marca Ancona 08/20/2020 3:19 PM

## 2020-08-20 NOTE — Progress Notes (Addendum)
Advanced Heart Failure Rounding Note  PCP-Cardiologist: Dietrich Pates, MD   Subjective:    SCr up after dose of IV Lasix yesterday, 1.59>>2.04. BUN 26.  K 3.3.  Mg 1.7   Hs trop 38>>86>>208>>288>>339. 2D echo shows normal LVEF, no RWMAs. He has had recent SSCP.   He remains Afib today but rate better in the 80s- 90s. BP soft in 90s systolic.   UDS + for cocaine.    Objective:   Weight Range: (!) 155.9 kg Body mass index is 47.92 kg/m.   Vital Signs:   Temp:  [97.7 F (36.5 C)-98.8 F (37.1 C)] 97.9 F (36.6 C) (12/02 1243) Pulse Rate:  [72-108] 103 (12/02 1243) Resp:  [14-23] 20 (12/02 1243) BP: (94-162)/(65-107) 94/65 (12/02 1243) SpO2:  [89 %-100 %] 96 % (12/02 1243) Weight:  [155.9 kg-156.6 kg] 155.9 kg (12/02 0450)    Weight change: Filed Weights   08/19/20 0957 08/19/20 1520 08/20/20 0450  Weight: (!) 161.9 kg (!) 156.6 kg (!) 155.9 kg    Intake/Output:   Intake/Output Summary (Last 24 hours) at 08/20/2020 1417 Last data filed at 08/20/2020 1100 Gross per 24 hour  Intake 1356 ml  Output 2050 ml  Net -694 ml      Physical Exam    General:  Well appearing, obese male. No resp difficulty HEENT: Normal Neck: Supple.  No JVP . Carotids 2+ bilat; no bruits. No lymphadenopathy or thyromegaly appreciated. Cor: PMI nondisplaced. Irregularly irregular rhythm and rate. No rubs, gallops or murmurs. Lungs: Clear Abdomen: obese, Soft, nontender, nondistended. No hepatosplenomegaly. No bruits or masses. Good bowel sounds. Extremities: No cyanosis, clubbing, rash, edema Neuro: Alert & orientedx3, cranial nerves grossly intact. moves all 4 extremities w/o difficulty. Affect pleasant   Telemetry   Atrial fibrillation 80s-90s   EKG    Not performed   Labs    CBC Recent Labs    08/19/20 1018 08/19/20 1120  WBC 5.9  --   HGB 14.7 17.7*  HCT 50.2 52.0  MCV 76.2*  --   PLT 389  --    Basic Metabolic Panel Recent Labs    37/62/83 1018  08/19/20 1018 08/19/20 1120 08/19/20 1617 08/20/20 0736  NA 138   < > 137  --  136  K 3.1*   < > 3.1*  --  3.3*  CL 96*  --   --   --  90*  CO2 27  --   --   --  33*  GLUCOSE 140*  --   --   --  121*  BUN 19  --   --   --  26*  CREATININE 1.59*  --   --   --  2.04*  CALCIUM 9.4  --   --   --  8.8*  MG  --   --   --  1.7  --    < > = values in this interval not displayed.   Liver Function Tests Recent Labs    08/19/20 1018  AST 23  ALT 16  ALKPHOS 52  BILITOT 0.5  PROT 7.7  ALBUMIN 3.5   No results for input(s): LIPASE, AMYLASE in the last 72 hours. Cardiac Enzymes No results for input(s): CKTOTAL, CKMB, CKMBINDEX, TROPONINI in the last 72 hours.  BNP: BNP (last 3 results) Recent Labs    08/22/19 1353 02/24/20 1501 08/19/20 1018  BNP 42.8 75.1 40.6    ProBNP (last 3 results) No results for input(s): PROBNP in  the last 8760 hours.   D-Dimer No results for input(s): DDIMER in the last 72 hours. Hemoglobin A1C Recent Labs    08/19/20 1617  HGBA1C 6.1*   Fasting Lipid Panel No results for input(s): CHOL, HDL, LDLCALC, TRIG, CHOLHDL, LDLDIRECT in the last 72 hours. Thyroid Function Tests No results for input(s): TSH, T4TOTAL, T3FREE, THYROIDAB in the last 72 hours.  Invalid input(s): FREET3  Other results:   Imaging    ECHOCARDIOGRAM COMPLETE  Result Date: 08/20/2020    ECHOCARDIOGRAM REPORT   Patient Name:   Benjamin [name] Date of Exam: 08/20/2020 Medical Rec #:  [id number]          Height:       71.0 in Accession #:    [id number]         Weight:       343.6 lb Date of Birth:  10/28/1965          BSA:          2.654 m Patient Age:    54 years           BP:           118/60 mmHg Patient Gender: M                  HR:           82 bpm. Exam Location:  Inpatient Procedure: 2D Echo, Color Doppler, Cardiac Doppler and Intracardiac            Opacification Agent Indications:    I50.31 Acute diastolic (congestive) heart failure  History:        Patient has  prior history of Echocardiogram examinations, most                 recent 07/17/2019. CHF, Arrythmias:Atrial Fibrillation; Risk                 Factors:Hypertension, Diabetes, Dyslipidemia and Sleep Apnea.  Sonographer:    Emily Senior RDCS Referring Phys: [id number] BRITTAINY M SIMMONS  Sonographer Comments: Technically difficult study due to poor echo windows. IMPRESSIONS  1. Left ventricular ejection fraction, by estimation, is 60 to 65%. The left ventricle has normal function. The left ventricle has no regional wall motion abnormalities. There is severe left ventricular hypertrophy. Left ventricular diastolic function could not be evaluated.  2. Right ventricular systolic function is normal. The right ventricular size is normal.  3. The mitral valve is normal in structure. No evidence of mitral valve regurgitation. No evidence of mitral stenosis.  4. The aortic valve has an indeterminant number of cusps. Aortic valve regurgitation is not visualized. No aortic stenosis is present.  5. Aortic dilatation noted. There is mild dilatation of the aortic root, measuring 38 mm.  6. The inferior vena cava is normal in size with greater than 50% respiratory variability, suggesting right atrial pressure of 3 mmHg. FINDINGS  Left Ventricle: Left ventricular ejection fraction, by estimation, is 60 to 65%. The left ventricle has normal function. The left ventricle has no regional wall motion abnormalities. Definity contrast agent was given IV to delineate the left ventricular  endocardial borders. The left ventricular internal cavity size was normal in size. There is severe left ventricular hypertrophy. Left ventricular diastolic function could not be evaluated due to atrial fibrillation. Left ventricular diastolic function could not be evaluated. Right Ventricle: The right ventricular size is normal. Right ventricular systolic function is normal. Left Atrium: Left atrial size was normal in size. Right   Atrium: Right atrial size was  normal in size. Pericardium: There is no evidence of pericardial effusion. Mitral Valve: The mitral valve is normal in structure. No evidence of mitral valve regurgitation. No evidence of mitral valve stenosis. Tricuspid Valve: The tricuspid valve is normal in structure. Tricuspid valve regurgitation is not demonstrated. No evidence of tricuspid stenosis. Aortic Valve: The aortic valve has an indeterminant number of cusps. Aortic valve regurgitation is not visualized. No aortic stenosis is present. Pulmonic Valve: The pulmonic valve was not well visualized. Pulmonic valve regurgitation is not visualized. No evidence of pulmonic stenosis. Aorta: Aortic dilatation noted. There is mild dilatation of the aortic root, measuring 38 mm. Venous: The inferior vena cava is normal in size with greater than 50% respiratory variability, suggesting right atrial pressure of 3 mmHg.  LEFT VENTRICLE PLAX 2D LVIDd:         4.20 cm LVIDs:         3.00 cm LV PW:         1.50 cm LV IVS:        1.80 cm LVOT diam:     2.40 cm LV SV:         68 LV SV Index:   26 LVOT Area:     4.52 cm  RIGHT VENTRICLE RV S prime:     11.10 cm/s TAPSE (M-mode): 1.8 cm LEFT ATRIUM             Index       RIGHT ATRIUM           Index LA diam:        4.00 cm 1.51 cm/m  RA Area:     18.70 cm LA Vol (A2C):   76.3 ml 28.75 ml/m RA Volume:   47.60 ml  17.94 ml/m LA Vol (A4C):   73.1 ml 27.55 ml/m LA Biplane Vol: 78.2 ml 29.47 ml/m  AORTIC VALVE LVOT Vmax:   106.15 cm/s LVOT Vmean:  72.500 cm/s LVOT VTI:    0.151 m  AORTA Ao Root diam: 3.80 cm Ao Asc diam:  3.30 cm  SHUNTS Systemic VTI:  0.15 m Systemic Diam: 2.40 cm Olga Millers MD Electronically signed by Olga Millers MD Signature Date/Time: 08/20/2020/1:16:39 PM    Final       Medications:     Scheduled Medications: . albuterol  2.5 mg Inhalation Q6H  . atorvastatin  40 mg Oral QHS  . docusate sodium  100 mg Oral BID  . doxazosin  2 mg Oral Daily  . ferrous sulfate  325 mg Oral Daily  .  hydroxypropyl methylcellulose / hypromellose  1 drop Both Eyes Daily  . insulin aspart  0-15 Units Subcutaneous TID WC  . labetalol  400 mg Oral q12n4p  . losartan  100 mg Oral QHS  . magnesium oxide  400 mg Oral Daily  . pantoprazole  40 mg Oral Daily  . potassium chloride  60 mEq Oral Daily  . rivaroxaban  20 mg Oral Q supper  . spironolactone  50 mg Oral Daily  . torsemide  100 mg Oral BID     Infusions: . diltiazem (CARDIZEM) infusion 10 mg/hr (08/20/20 1100)     PRN Medications:  acetaminophen, methocarbamol, ondansetron (ZOFRAN) IV   Assessment/Plan   1. Chronic diastolic CHF: Echo in 10/20 with EF 65-70%, RV interpreted as normal but suspect significant RV dysfunction.  - he has had multiple admissions in the past for a/c HF in the setting of poor compliance  w/ meds and dietary indiscretion w/ sodium. Now being admitted for a/c CHF in the setting of rapid afib after drug binge w/ ETOH and cocaine.  - suspect over diuresis w/ IV Lasix. SCr/BUN up. Hold diuretics today. Repeat BMP in AM.  - treat afib through rate control. If no spontaneous conversion, will need TEE guided DCCV (recently missed meds).  - Continue spironolactone 50 mg daily. - hold torsemide tonight 2. Paroxsymal Atrial fibrillation:  - presented w afib w/ RVR after drug binge w/ ETOH and cocaine.  - rates better today in 80s-90s - continue on Cardizem gtt for rate control  - restart Xarelto (missed several days at home) - If no spontaneous conversion, will need TEE guided DCCV - supp K (3.3).   3. H/o PE: 5/20, continue Xarelto.  4. OHS/OSA:CPAP QHS  5. HTN: BP today, suspect over diuresis - may need gentle IVF hydration  7. Polysubstance Use -  UDS + for cocaine and THC  8. Situational Depression  - management per IM  9. Hypokalemia/ Hypomagnesemia  - K 3.3 - Mg 1.7  - supp and follow BMP  10. AKI - SCr 1.59 on admit, bumped to 2.0 today - suspect overdiuresis. Hold diuretics today - may  need to give back gentle IVFs - Baseline 1.2-1.4, monitor, repeat BMP in am  11. Chest Pain w/ mildly elevated Hs trop - hs trop 38>>86>>208>>288>>339 - 2D echo shows normal EF and no RWMAs - likely demand ischemia from rapid afib/ fluid overload and cocaine use however given trend, may need LHC once AKI resolves vs 2 day nuclear stress test  12. DM - on insulin. Management per IM   Length of Stay: 1  Brittainy Simmons, PA-C  08/20/2020, 2:17 PM  Advanced Heart Failure Team Pager [phone] (M-F; 7a - 4p)  Please contact [facility] for night-coverage after hours (4p -7a ) and weekends on [email or web address]  Patient seen with PA, agree with the above note.    He remains in atrial fibrillation today, rate is controlled on diltiazem gtt.   He got IV Lasix yesterday, creatinine up to 2.04 today.    HS-TnI elevated to 339.   Echo reviewed, EF 60-65%, RV normal, IVC normal.   General: NAD, obese Neck: No JVD, no thyromegaly or thyroid nodule.  Lungs: Clear to auscultation bilaterally with normal respiratory effort. CV: Nondisplaced PMI.  Heart regular S1/S2, no S3/S4, no murmur.  No peripheral edema.   Abdomen: Soft, nontender, no hepatosplenomegaly, no distention.  Skin: Intact without lesions or rashes.  Neurologic: Alert and oriented x 3.  Psych: Normal affect. Extremities: No clubbing or cyanosis.  HEENT: Normal.   He has been started back on Xarelto, 2nd dose today.  Will plan TEE-guided DCCV tomorrow.  Discussed risks/benefits with patient and he agrees to proceed.   Based on exam and echo (small IVC), do not think he is volume overloaded.  Creatinine up to 2.04, likely over-diuresed.  Hold diuretics, spironolactone, and losartan for now.   Mild elevation in troponin.  May be due to demand ischemia with afib/RVR and cocaine use.  He has had some chest tightness.  Will set up for Cardiolite, will need to be 2 day study.   Marca Ancona 08/20/2020 3:19 PM

## 2020-08-20 NOTE — Progress Notes (Addendum)
Echocardiogram 2D Echocardiogram has been performed.  Warren Lacy Hisae Decoursey 08/20/2020, 11:19 AM   Redressed left hand IV due to old tape and kink in line.

## 2020-08-20 NOTE — Plan of Care (Signed)
  Problem: Education: Goal: Knowledge of disease or condition will improve Outcome: Progressing Goal: Understanding of medication regimen will improve Outcome: Progressing Goal: Individualized Educational Video(s) Outcome: Progressing   Problem: Activity: Goal: Ability to tolerate increased activity will improve Outcome: Progressing   Problem: Cardiac: Goal: Ability to achieve and maintain adequate cardiopulmonary perfusion will improve Outcome: Progressing   Problem: Health Behavior/Discharge Planning: Goal: Ability to safely manage health-related needs after discharge will improve Outcome: Progressing   Problem: Education: Goal: Ability to demonstrate management of disease process will improve Outcome: Progressing Goal: Ability to verbalize understanding of medication therapies will improve Outcome: Progressing Goal: Individualized Educational Video(s) Outcome: Progressing   Problem: Activity: Goal: Capacity to carry out activities will improve Outcome: Progressing   Problem: Cardiac: Goal: Ability to achieve and maintain adequate cardiopulmonary perfusion will improve Outcome: Progressing

## 2020-08-20 NOTE — Progress Notes (Signed)
PROGRESS NOTE    Benjamin [name]  VVZ:[id number] DOB: Jul 14, 1966 DOA: 08/19/2020 PCP: Claiborne Rigg, NP     Brief Narrative:  55 y.o.BM PMHx Chronic Diastolic CHF, Chronic A. fib, HTN, chronic hypoxic respiratory failure on 3 L O2 at home, PE, CKD stage II, Chronic hypokalemia, Morbid obesity, OSA/OHS DM type II, substance abuse, EtOH abuse,  Presented with increasing short of breath and weakness.  Patient claimed that he was on a trip out of town over the weekend, and forgot to take all his CHF medications along with him, as a result, he has not been taking any of his medication for last 3 days. Now being admitted for a/c CHF in the setting of rapid afib after drug binge w/ ETOH and cocaine.  This morning, he started to feel extremely shortness of breath, heaviness in the chest, denied any cough, no no fever chills. ED Course: Patient was found to be in rapid A. fib, Cardizem drip started.  Chest x-ray showed chronic lung vasculature congestion.   Subjective: A/O x4, states began drinking, smoking marijuana and using cocaine a month ago when his mother died.  States does not know what a diabetic diet or heart healthy diet consists of but is willing to learn.   Assessment & Plan: Covid vaccination; vaccinated   Active Problems:   Morbid obesity with BMI of 50.0-59.9, adult (HCC)   Chronic diastolic CHF (congestive heart failure) (HCC)   PAF (paroxysmal atrial fibrillation) (HCC)   OSA on CPAP   Acute on chronic diastolic CHF (congestive heart failure) (HCC)   Hypertension   Diabetic neuropathy (HCC)   Obesity hypoventilation syndrome (HCC)   Atrial fibrillation with rapid ventricular response (HCC)   Polysubstance abuse (HCC)   Alcohol abuse   A. fib with RVR -Multifactorial; noncompliance, EtOH abuse, cocaine abuse, OHS/OSA. -Rivaroxaban -Cardizem drip -Doxazosin 2 mg daily -Labetalol 400 mg BID -Per cardiology treat afib through rate control. If no spontaneous  conversion, will need TEE guided DCCV  -Spironolactone 50 mg daily -Hold Torsemide  Acute on Chronic Diastolic CHF -See A. fib RVR -Strict in and out -Daily weight  Primary HTN -See A. fib RVR  Acute on CKD stage II (baseline Cr~1.21) -Multifactorial prerenal secondary to cardiorenal syndrome, uncontrolled A. fib, continued diuresis, drug abuse. -Correct underlying causes Lab Results  Component Value Date   CREATININE 2.04 (H) 08/20/2020   CREATININE 1.59 (H) 08/19/2020   CREATININE 1.21 07/03/2020   CREATININE 1.38 (H) 07/02/2020   CREATININE 1.09 03/02/2020  -We will monitor for improvement with improvement in A. fib, clearance of toxins from system.  Hold diuretics  DM type II controlled with complication -Hold home medication -Moderate SSI -DM coordinator consult; patient with poor impulse control, and poor understanding of his disease process requires education -Nutrition consult;patient with poor impulse control, and poor understanding of his disease process requires nutritional education  Polysubstance abuse/EtOH abuse -12/1 urine tox screen positive cocaine, marijuana -12/1 EtOH screen pending -CIWA protocol -LCSW consult substance abuse resources  Hypokalemia -Potassium goal> 4 -K-Dur 100 mEq  OSA/OHS -CPAP per respiratory  Morbidly obese (BMI 47.9 kg/m) -Patient would benefit from bariatric surgery consult as outpatient    DVT prophylaxis: Rivaroxaban Code Status: Full Family Communication:   Status is: Inpatient    Dispo: The patient is from: Home              Anticipated d/c is to: Home  Anticipated d/c date is: 12/7              Patient currently unstable      Consultants:  Cardiology  Procedures/Significant Events:    I have personally reviewed and interpreted all radiology studies and my findings are as above.  VENTILATOR SETTINGS:    Cultures   Antimicrobials:    Devices    LINES / TUBES:       Continuous Infusions: . diltiazem (CARDIZEM) infusion 10 mg/hr (08/20/20 1100)     Objective: Vitals:   08/20/20 0844 08/20/20 0907 08/20/20 1243 08/20/20 1710  BP:  (!) 155/67 94/65 124/73  Pulse: 98 72 (!) 103 74  Resp: Temp:  97.8 F (36.6 C) 97.9 F (36.6 C) (!) 95.3 F (35.2 C)  TempSrc:  Oral Oral Oral  SpO2: 98% 98% 96% 97%  Weight:      Height:        Intake/Output Summary (Last 24 hours) at 08/20/2020 1755 Last data filed at 08/20/2020 1100 Gross per 24 hour  Intake 645 ml  Output 2050 ml  Net -1405 ml   Filed Weights   08/19/20 0957 08/19/20 1520 08/20/20 0450  Weight: (!) 161.9 kg (!) 156.6 kg (!) 155.9 kg    Examination:  General: A/O x4+ acute on chronic respiratory distress Eyes: negative scleral hemorrhage, negative anisocoria, negative icterus ENT: Negative Runny nose, negative gingival bleeding, Neck:  Negative scars, masses, torticollis, lymphadenopathy, JVD Lungs: decreased breath sounds bilaterally without wheezes or crackles Cardiovascular: Regular rhythm and rate without murmur gallop or rub normal S1 and S2 Abdomen: MORBIDLY OBESE, negative abdominal pain, nondistended, positive soft, bowel sounds, no rebound, no ascites, no appreciable mass Extremities: No significant cyanosis, clubbing, or edema bilateral lower extremities Skin: Negative rashes, lesions, ulcers Psychiatric: Positive depression, negative anxiety, negative fatigue, negative mania  Central nervous system:  Cranial nerves II through XII intact, tongue/uvula midline, all extremities muscle strength 5/5, sensation intact throughout, negative dysarthria, negative expressive aphasia, negative receptive aphasia.  .     Data Reviewed: Care during the described time interval was provided by me .  I have reviewed this patient's available data, including medical history, events of note, physical examination, and all test results as part of my  evaluation.  CBC: Recent Labs  Lab 08/19/20 1018 08/19/20 1120  WBC 5.9  --   HGB 14.7 17.7*  HCT 50.2 52.0  MCV 76.2*  --   PLT 389  --    Basic Metabolic Panel: Recent Labs  Lab 08/19/20 1018 08/19/20 1120 08/19/20 1617 08/20/20 0736  NA 138 137  --  136  K 3.1* 3.1*  --  3.3*  CL 96*  --   --  90*  CO2 27  --   --  33*  GLUCOSE 140*  --   --  121*  BUN 19  --   --  26*  CREATININE 1.59*  --   --  2.04*  CALCIUM 9.4  --   --  8.8*  MG  --   --  1.7  --    GFR: Estimated Creatinine Clearance: 62.9 mL/min (A) (by C-G formula based on SCr of 2.04 mg/dL (H)). Liver Function Tests: Recent Labs  Lab 08/19/20 1018  AST 23  ALT 16  ALKPHOS 52  BILITOT 0.5  PROT 7.7  ALBUMIN 3.5   No results for input(s): LIPASE, AMYLASE in the last 168 hours. No results for input(s): AMMONIA in the  last 168 hours. Coagulation Profile: No results for input(s): INR, PROTIME in the last 168 hours. Cardiac Enzymes: No results for input(s): CKTOTAL, CKMB, CKMBINDEX, TROPONINI in the last 168 hours. BNP (last 3 results) No results for input(s): PROBNP in the last 8760 hours. HbA1C: Recent Labs    08/19/20 1617  HGBA1C 6.1*   CBG: Recent Labs  Lab 08/19/20 1627 08/19/20 2134 08/20/20 0903 08/20/20 1240 08/20/20 1642  GLUCAP 130* 102* 140* 100* 178*   Lipid Profile: No results for input(s): CHOL, HDL, LDLCALC, TRIG, CHOLHDL, LDLDIRECT in the last 72 hours. Thyroid Function Tests: No results for input(s): TSH, T4TOTAL, FREET4, T3FREE, THYROIDAB in the last 72 hours. Anemia Panel: No results for input(s): VITAMINB12, FOLATE, FERRITIN, TIBC, IRON, RETICCTPCT in the last 72 hours. Sepsis Labs: No results for input(s): PROCALCITON, LATICACIDVEN in the last 168 hours.  Recent Results (from the past 240 hour(s))  Resp Panel by RT-PCR (Flu A&B, Covid) Nasopharyngeal Swab     Status: None   Collection Time: 08/19/20  2:08 PM   Specimen: Nasopharyngeal Swab; Nasopharyngeal(NP)  swabs in vial transport medium  Result Value Ref Range Status   SARS Coronavirus 2 by RT PCR NEGATIVE NEGATIVE Final    Comment: (NOTE) SARS-CoV-2 target nucleic acids are NOT DETECTED.  The SARS-CoV-2 RNA is generally detectable in upper respiratory specimens during the acute phase of infection. The lowest concentration of SARS-CoV-2 viral copies this assay can detect is 138 copies/mL. A negative result does not preclude SARS-Cov-2 infection and should not be used as the sole basis for treatment or other patient management decisions. A negative result may occur with  improper specimen collection/handling, submission of specimen other than nasopharyngeal swab, presence of viral mutation(s) within the areas targeted by this assay, and inadequate number of viral copies(<138 copies/mL). A negative result must be combined with clinical observations, patient history, and epidemiological information. The expected result is Negative.  Fact Sheet for Patients:  BloggerCourse.com  Fact Sheet for Healthcare Providers:  SeriousBroker.it  This test is no t yet approved or cleared by the [place] FDA and  has been authorized for detection and/or diagnosis of SARS-CoV-2 by FDA under an Emergency Use Authorization (EUA). This EUA will remain  in effect (meaning this test can be used) for the duration of the COVID-19 declaration under Section 564(b)(1) of the Act, 21 U.S.C.section 360bbb-3(b)(1), unless the authorization is terminated  or revoked sooner.       Influenza A by PCR NEGATIVE NEGATIVE Final   Influenza B by PCR NEGATIVE NEGATIVE Final    Comment: (NOTE) The Xpert Xpress SARS-CoV-2/FLU/RSV plus assay is intended as an aid in the diagnosis of influenza from Nasopharyngeal swab specimens and should not be used as a sole basis for treatment. Nasal washings and aspirates are unacceptable for Xpert Xpress  SARS-CoV-2/FLU/RSV testing.  Fact Sheet for Patients: BloggerCourse.com  Fact Sheet for Healthcare Providers: SeriousBroker.it  This test is not yet approved or cleared by the [place] FDA and has been authorized for detection and/or diagnosis of SARS-CoV-2 by FDA under an Emergency Use Authorization (EUA). This EUA will remain in effect (meaning this test can be used) for the duration of the COVID-19 declaration under Section 564(b)(1) of the Act, 21 U.S.C. section 360bbb-3(b)(1), unless the authorization is terminated or revoked.  Performed at [facility], [address], [place], [place] [address]          Radiology Studies: DG Chest Port 1 View  Result Date: 08/19/2020  CLINICAL DATA:  Chest pain shortness of breath EXAM: PORTABLE CHEST 1 VIEW COMPARISON:  02/24/2020 FINDINGS: Trachea midline. Heart size remains enlarged with fullness of LEFT and RIGHT hilum as before. Heart size accentuated by portable technique. Increased interstitial markings throughout the chest. Small amount of fluid possible on the RIGHT minor fissure. No lobar consolidative changes. On limited assessment no acute skeletal process. IMPRESSION: Findings that favor heart failure and/or volume overload. Slight asymmetry could also be seen in the setting of atypical or viral infection. Electronically Signed   By: Donzetta Kohut M.D.   On: 08/19/2020 10:39   ECHOCARDIOGRAM COMPLETE  Result Date: 08/20/2020    ECHOCARDIOGRAM REPORT   Patient Name:   Benjamin [name] Date of Exam: 08/20/2020 Medical Rec #:  [id number]          Height:       71.0 in Accession #:    [id number]         Weight:       343.6 lb Date of Birth:  [birth year]          BSA:          2.654 m Patient Age:    54 years           BP:           118/60 mmHg Patient Gender: M                  HR:           82 bpm. Exam Location:  Inpatient Procedure: 2D Echo, Color Doppler, Cardiac Doppler  and Intracardiac            Opacification Agent Indications:    I50.31 Acute diastolic (congestive) heart failure  History:        Patient has prior history of Echocardiogram examinations, most                 recent 07/17/2019. CHF, Arrythmias:Atrial Fibrillation; Risk                 Factors:Hypertension, Diabetes, Dyslipidemia and Sleep Apnea.  Sonographer:    Irving Burton Senior RDCS Referring Phys: [id number] Roxy Horseman SIMMONS  Sonographer Comments: Technically difficult study due to poor echo windows. IMPRESSIONS  1. Left ventricular ejection fraction, by estimation, is 60 to 65%. The left ventricle has normal function. The left ventricle has no regional wall motion abnormalities. There is severe left ventricular hypertrophy. Left ventricular diastolic function could not be evaluated.  2. Right ventricular systolic function is normal. The right ventricular size is normal.  3. The mitral valve is normal in structure. No evidence of mitral valve regurgitation. No evidence of mitral stenosis.  4. The aortic valve has an indeterminant number of cusps. Aortic valve regurgitation is not visualized. No aortic stenosis is present.  5. Aortic dilatation noted. There is mild dilatation of the aortic root, measuring 38 mm.  6. The inferior vena cava is normal in size with greater than 50% respiratory variability, suggesting right atrial pressure of 3 mmHg. FINDINGS  Left Ventricle: Left ventricular ejection fraction, by estimation, is 60 to 65%. The left ventricle has normal function. The left ventricle has no regional wall motion abnormalities. Definity contrast agent was given IV to delineate the left ventricular  endocardial borders. The left ventricular internal cavity size was normal in size. There is severe left ventricular hypertrophy. Left ventricular diastolic function could not be evaluated due to atrial fibrillation. Left ventricular diastolic function could not  be evaluated. Right Ventricle: The right ventricular size  is normal. Right ventricular systolic function is normal. Left Atrium: Left atrial size was normal in size. Right Atrium: Right atrial size was normal in size. Pericardium: There is no evidence of pericardial effusion. Mitral Valve: The mitral valve is normal in structure. No evidence of mitral valve regurgitation. No evidence of mitral valve stenosis. Tricuspid Valve: The tricuspid valve is normal in structure. Tricuspid valve regurgitation is not demonstrated. No evidence of tricuspid stenosis. Aortic Valve: The aortic valve has an indeterminant number of cusps. Aortic valve regurgitation is not visualized. No aortic stenosis is present. Pulmonic Valve: The pulmonic valve was not well visualized. Pulmonic valve regurgitation is not visualized. No evidence of pulmonic stenosis. Aorta: Aortic dilatation noted. There is mild dilatation of the aortic root, measuring 38 mm. Venous: The inferior vena cava is normal in size with greater than 50% respiratory variability, suggesting right atrial pressure of 3 mmHg.  LEFT VENTRICLE PLAX 2D LVIDd:         4.20 cm LVIDs:         3.00 cm LV PW:         1.50 cm LV IVS:        1.80 cm LVOT diam:     2.40 cm LV SV:         68 LV SV Index:   26 LVOT Area:     4.52 cm  RIGHT VENTRICLE RV S prime:     11.10 cm/s TAPSE (M-mode): 1.8 cm LEFT ATRIUM             Index       RIGHT ATRIUM           Index LA diam:        4.00 cm 1.51 cm/m  RA Area:     18.70 cm LA Vol (A2C):   76.3 ml 28.75 ml/m RA Volume:   47.60 ml  17.94 ml/m LA Vol (A4C):   73.1 ml 27.55 ml/m LA Biplane Vol: 78.2 ml 29.47 ml/m  AORTIC VALVE LVOT Vmax:   106.15 cm/s LVOT Vmean:  72.500 cm/s LVOT VTI:    0.151 m  AORTA Ao Root diam: 3.80 cm Ao Asc diam:  3.30 cm  SHUNTS Systemic VTI:  0.15 m Systemic Diam: 2.40 cm Olga Millers MD Electronically signed by Olga Millers MD Signature Date/Time: 08/20/2020/1:16:39 PM    Final         Scheduled Meds: . albuterol  2.5 mg Inhalation Q6H  . atorvastatin  40 mg  Oral QHS  . docusate sodium  100 mg Oral BID  . doxazosin  2 mg Oral Daily  . ferrous sulfate  325 mg Oral Daily  . hydroxypropyl methylcellulose / hypromellose  1 drop Both Eyes Daily  . insulin aspart  0-15 Units Subcutaneous TID WC  . labetalol  400 mg Oral q12n4p  . magnesium oxide  400 mg Oral Daily  . pantoprazole  40 mg Oral Daily  . potassium chloride  60 mEq Oral Daily  . rivaroxaban  20 mg Oral Q supper   Continuous Infusions: . diltiazem (CARDIZEM) infusion 10 mg/hr (08/20/20 1100)     LOS: 1 day    Time spent:40 min    Phoenix Riesen, Roselind Messier, MD [facility] Pager [phone]  If 7PM-7AM, please contact night-coverage [email or web address] Password [facility] An 08/20/2020, 5:55 PM

## 2020-08-21 ENCOUNTER — Inpatient Hospital Stay (HOSPITAL_COMMUNITY): Payer: Medicaid Other

## 2020-08-21 ENCOUNTER — Encounter (HOSPITAL_COMMUNITY): Payer: Self-pay | Admitting: Internal Medicine

## 2020-08-21 ENCOUNTER — Encounter (HOSPITAL_COMMUNITY)
Admission: EM | Disposition: A | Discharge status: Home / Self Care | Payer: Self-pay | Source: Home / Self Care | Attending: Internal Medicine

## 2020-08-21 ENCOUNTER — Inpatient Hospital Stay (HOSPITAL_COMMUNITY): Payer: Medicaid Other | Admitting: Certified Registered Nurse Anesthetist

## 2020-08-21 DIAGNOSIS — I5033 Acute on chronic diastolic (congestive) heart failure: Secondary | ICD-10-CM | POA: Diagnosis not present

## 2020-08-21 DIAGNOSIS — I10 Essential (primary) hypertension: Secondary | ICD-10-CM | POA: Diagnosis not present

## 2020-08-21 DIAGNOSIS — F1010 Alcohol abuse, uncomplicated: Secondary | ICD-10-CM | POA: Diagnosis not present

## 2020-08-21 DIAGNOSIS — I4891 Unspecified atrial fibrillation: Secondary | ICD-10-CM

## 2020-08-21 HISTORY — PX: CARDIOVERSION: SHX1299

## 2020-08-21 HISTORY — PX: TEE WITHOUT CARDIOVERSION: SHX5443

## 2020-08-21 LAB — CBC WITH DIFFERENTIAL/PLATELET
Abs Immature Granulocytes: 0.03 10*3/uL (ref 0.00–0.07)
Basophils Absolute: 0 10*3/uL (ref 0.0–0.1)
Basophils Relative: 0 %
Eosinophils Absolute: 0.4 10*3/uL (ref 0.0–0.5)
Eosinophils Relative: 4 %
HCT: 46.7 % (ref 39.0–52.0)
Hemoglobin: 14.7 g/dL (ref 13.0–17.0)
Immature Granulocytes: 0 %
Lymphocytes Relative: 27 %
Lymphs Abs: 2.5 10*3/uL (ref 0.7–4.0)
MCH: 23.6 pg — ABNORMAL LOW (ref 26.0–34.0)
MCHC: 31.5 g/dL (ref 30.0–36.0)
MCV: 74.8 fL — ABNORMAL LOW (ref 80.0–100.0)
Monocytes Absolute: 1.2 10*3/uL — ABNORMAL HIGH (ref 0.1–1.0)
Monocytes Relative: 13 %
Neutro Abs: 5.1 10*3/uL (ref 1.7–7.7)
Neutrophils Relative %: 56 %
Platelets: 396 10*3/uL (ref 150–400)
RBC: 6.24 MIL/uL — ABNORMAL HIGH (ref 4.22–5.81)
RDW: 19.9 % — ABNORMAL HIGH (ref 11.5–15.5)
WBC: 9.2 10*3/uL (ref 4.0–10.5)
nRBC: 0 % (ref 0.0–0.2)

## 2020-08-21 LAB — GLUCOSE, CAPILLARY
Glucose-Capillary: 104 mg/dL — ABNORMAL HIGH (ref 70–99)
Glucose-Capillary: 111 mg/dL — ABNORMAL HIGH (ref 70–99)
Glucose-Capillary: 92 mg/dL (ref 70–99)

## 2020-08-21 LAB — COMPREHENSIVE METABOLIC PANEL
ALT: 15 U/L (ref 0–44)
AST: 16 U/L (ref 15–41)
Albumin: 3.3 g/dL — ABNORMAL LOW (ref 3.5–5.0)
Alkaline Phosphatase: 48 U/L (ref 38–126)
Anion gap: 12 (ref 5–15)
BUN: 39 mg/dL — ABNORMAL HIGH (ref 6–20)
CO2: 31 mmol/L (ref 22–32)
Calcium: 9 mg/dL (ref 8.9–10.3)
Chloride: 94 mmol/L — ABNORMAL LOW (ref 98–111)
Creatinine, Ser: 2.52 mg/dL — ABNORMAL HIGH (ref 0.61–1.24)
GFR, Estimated: 30 mL/min — ABNORMAL LOW (ref >60)
Glucose, Bld: 128 mg/dL — ABNORMAL HIGH (ref 70–99)
Potassium: 3.3 mmol/L — ABNORMAL LOW (ref 3.5–5.1)
Sodium: 137 mmol/L (ref 135–145)
Total Bilirubin: 0.3 mg/dL (ref 0.3–1.2)
Total Protein: 7.5 g/dL (ref 6.5–8.1)

## 2020-08-21 LAB — MAGNESIUM: Magnesium: 2.2 mg/dL (ref 1.7–2.4)

## 2020-08-21 LAB — PHOSPHORUS: Phosphorus: 4.9 mg/dL — ABNORMAL HIGH (ref 2.5–4.6)

## 2020-08-21 SURGERY — ECHOCARDIOGRAM, TRANSESOPHAGEAL
Anesthesia: General

## 2020-08-21 MED ORDER — LIDOCAINE 2% (20 MG/ML) 5 ML SYRINGE
INTRAMUSCULAR | Status: DC | PRN
  Administered 2020-08-21: 100 mg via INTRAVENOUS

## 2020-08-21 MED ORDER — ALBUTEROL SULFATE (2.5 MG/3ML) 0.083% IN NEBU: 2.5000 mg | INHALATION_SOLUTION | Freq: Two times a day (BID) | RESPIRATORY_TRACT | Status: DC

## 2020-08-21 MED ORDER — DEXMEDETOMIDINE (PRECEDEX) IN NS 20 MCG/5ML (4 MCG/ML) IV SYRINGE
PREFILLED_SYRINGE | INTRAVENOUS | Status: DC | PRN
  Administered 2020-08-21: 20 ug via INTRAVENOUS

## 2020-08-21 MED ORDER — SODIUM CHLORIDE 0.9 % IV BOLUS
1000.0000 mL | Freq: Once | INTRAVENOUS | Status: AC
  Administered 2020-08-21: 1000 mL via INTRAVENOUS

## 2020-08-21 MED ORDER — POTASSIUM CHLORIDE CRYS ER 20 MEQ PO TBCR
40.0000 meq | EXTENDED_RELEASE_TABLET | Freq: Once | ORAL | Status: AC
  Administered 2020-08-21: 40 meq via ORAL
  Filled 2020-08-21: qty 2

## 2020-08-21 MED ORDER — PROPOFOL 500 MG/50ML IV EMUL
INTRAVENOUS | Status: DC | PRN
  Administered 2020-08-21: 100 ug/kg/min via INTRAVENOUS

## 2020-08-21 MED ORDER — DILTIAZEM HCL ER COATED BEADS 240 MG PO CP24
240.0000 mg | ORAL_CAPSULE | Freq: Every day | ORAL | Status: DC
  Administered 2020-08-21 – 2020-08-24 (×4): 240 mg via ORAL
  Filled 2020-08-21 (×4): qty 1

## 2020-08-21 NOTE — Anesthesia Postprocedure Evaluation (Signed)
Anesthesia Post Note  Patient: Benjamin [name]  Procedure(s) Performed: TRANSESOPHAGEAL ECHOCARDIOGRAM (TEE) (N/A ) CARDIOVERSION (N/A )     Patient location during evaluation: Endoscopy Anesthesia Type: General Level of consciousness: awake and alert, patient cooperative and oriented Pain management: pain level controlled Vital Signs Assessment: post-procedure vital signs reviewed and stable Respiratory status: spontaneous breathing, nonlabored ventilation and respiratory function stable Cardiovascular status: stable and blood pressure returned to baseline Postop Assessment: no apparent nausea or vomiting Anesthetic complications: no   No complications documented.  Last Vitals:  Vitals:   08/21/20 1624 08/21/20 1630  BP: 98/70 112/71  Pulse: 85 84  Resp: 15 16  Temp: 36.5 C   SpO2: 94% 96%    Last Pain:  Vitals:   08/21/20 1630  TempSrc:   PainSc: 0-No pain                 Nana Vastine,E. Waver Dibiasio

## 2020-08-21 NOTE — Progress Notes (Addendum)
Advanced Heart Failure Rounding Note  PCP-Cardiologist: Dietrich Pates, MD   Subjective:   Remains on cardizem drip.   Lab Results  Component Value Date   CREATININE 2.52 (H) 08/21/2020   CREATININE 2.60 (H) 08/20/2020   CREATININE 2.04 (H) 08/20/2020   Denies shortness of breath.   Objective:   Weight Range: (!) 162.4 kg Body mass index is 49.93 kg/m.   Vital Signs:   Temp:  [95.3 F (35.2 C)-98.6 F (37 C)] 97.9 F (36.6 C) (12/03 0830) Pulse Rate:  [62-115] 83 (12/03 0830) Resp:  [16-20] 19 (12/03 0830) BP: (94-136)/(65-119) 100/87 (12/03 0830) SpO2:  [95 %-100 %] 98 % (12/03 0830) Weight:  [162.4 kg] 162.4 kg (12/03 0605)    Weight change: Filed Weights   08/19/20 1520 08/20/20 0450 08/21/20 0605  Weight: (!) 156.6 kg (!) 155.9 kg (!) 162.4 kg    Intake/Output:   Intake/Output Summary (Last 24 hours) at 08/21/2020 0941 Last data filed at 08/21/2020 0500 Gross per 24 hour  Intake 185.68 ml  Output 1450 ml  Net -1264.32 ml      Physical Exam    General:  Sitting on side of the bed.  No resp difficulty HEENT: normal Neck: supple. no JVD. Carotids 2+ bilat; no bruits. No lymphadenopathy or thryomegaly appreciated. Cor: PMI nondisplaced. Irregular rate & rhythm. No rubs, gallops or murmurs. Lungs: clear Abdomen: obese, soft, nontender, nondistended. No hepatosplenomegaly. No bruits or masses. Good bowel sounds. Extremities: no cyanosis, clubbing, rash, edema Neuro: alert & orientedx3, cranial nerves grossly intact. moves all 4 extremities w/o difficulty. Affect pleasant   Telemetry   A fib 80-90s   EKG    Not performed   Labs    CBC Recent Labs    08/20/20 2127 08/21/20 0250  WBC 10.0 9.2  NEUTROABS  --  5.1  HGB 14.0 14.7  HCT 47.2 46.7  MCV 75.4* 74.8*  PLT 396 396   Basic Metabolic Panel Recent Labs    22/41/14 2127 08/21/20 0250  NA 134* 137  K 3.1* 3.3*  CL 91* 94*  CO2 31 31  GLUCOSE 126* 128*  BUN 39* 39*  CREATININE  2.60* 2.52*  CALCIUM 8.7* 9.0  MG 2.1 2.2  PHOS 5.2* 4.9*   Liver Function Tests Recent Labs    08/20/20 2127 08/21/20 0250  AST 17 16  ALT 14 15  ALKPHOS 45 48  BILITOT 0.5 0.3  PROT 7.2 7.5  ALBUMIN 3.2* 3.3*   No results for input(s): LIPASE, AMYLASE in the last 72 hours. Cardiac Enzymes No results for input(s): CKTOTAL, CKMB, CKMBINDEX, TROPONINI in the last 72 hours.  BNP: BNP (last 3 results) Recent Labs    08/22/19 1353 02/24/20 1501 08/19/20 1018  BNP 42.8 75.1 40.6    ProBNP (last 3 results) No results for input(s): PROBNP in the last 8760 hours.   D-Dimer No results for input(s): DDIMER in the last 72 hours. Hemoglobin A1C Recent Labs    08/19/20 1617  HGBA1C 6.1*   Fasting Lipid Panel No results for input(s): CHOL, HDL, LDLCALC, TRIG, CHOLHDL, LDLDIRECT in the last 72 hours. Thyroid Function Tests No results for input(s): TSH, T4TOTAL, T3FREE, THYROIDAB in the last 72 hours.  Invalid input(s): FREET3  Other results:   Imaging    ECHOCARDIOGRAM COMPLETE  Result Date: 08/20/2020    ECHOCARDIOGRAM REPORT   Patient Name:   Benjamin [name] Date of Exam: 08/20/2020 Medical Rec #:  [id number]  Height:       71.0 in Accession #:    [id number]         Weight:       343.6 lb Date of Birth:  June 14, 1966          BSA:          2.654 m Patient Age:    54 years           BP:           118/60 mmHg Patient Gender: M                  HR:           82 bpm. Exam Location:  Inpatient Procedure: 2D Echo, Color Doppler, Cardiac Doppler and Intracardiac            Opacification Agent Indications:    I50.31 Acute diastolic (congestive) heart failure  History:        Patient has prior history of Echocardiogram examinations, most                 recent 07/17/2019. CHF, Arrythmias:Atrial Fibrillation; Risk                 Factors:Hypertension, Diabetes, Dyslipidemia and Sleep Apnea.  Sonographer:    Irving Burton Senior RDCS Referring Phys: [id number] Roxy Horseman SIMMONS   Sonographer Comments: Technically difficult study due to poor echo windows. IMPRESSIONS  1. Left ventricular ejection fraction, by estimation, is 60 to 65%. The left ventricle has normal function. The left ventricle has no regional wall motion abnormalities. There is severe left ventricular hypertrophy. Left ventricular diastolic function could not be evaluated.  2. Right ventricular systolic function is normal. The right ventricular size is normal.  3. The mitral valve is normal in structure. No evidence of mitral valve regurgitation. No evidence of mitral stenosis.  4. The aortic valve has an indeterminant number of cusps. Aortic valve regurgitation is not visualized. No aortic stenosis is present.  5. Aortic dilatation noted. There is mild dilatation of the aortic root, measuring 38 mm.  6. The inferior vena cava is normal in size with greater than 50% respiratory variability, suggesting right atrial pressure of 3 mmHg. FINDINGS  Left Ventricle: Left ventricular ejection fraction, by estimation, is 60 to 65%. The left ventricle has normal function. The left ventricle has no regional wall motion abnormalities. Definity contrast agent was given IV to delineate the left ventricular  endocardial borders. The left ventricular internal cavity size was normal in size. There is severe left ventricular hypertrophy. Left ventricular diastolic function could not be evaluated due to atrial fibrillation. Left ventricular diastolic function could not be evaluated. Right Ventricle: The right ventricular size is normal. Right ventricular systolic function is normal. Left Atrium: Left atrial size was normal in size. Right Atrium: Right atrial size was normal in size. Pericardium: There is no evidence of pericardial effusion. Mitral Valve: The mitral valve is normal in structure. No evidence of mitral valve regurgitation. No evidence of mitral valve stenosis. Tricuspid Valve: The tricuspid valve is normal in structure. Tricuspid  valve regurgitation is not demonstrated. No evidence of tricuspid stenosis. Aortic Valve: The aortic valve has an indeterminant number of cusps. Aortic valve regurgitation is not visualized. No aortic stenosis is present. Pulmonic Valve: The pulmonic valve was not well visualized. Pulmonic valve regurgitation is not visualized. No evidence of pulmonic stenosis. Aorta: Aortic dilatation noted. There is mild dilatation of the aortic root, measuring 38 mm. Venous:  The inferior vena cava is normal in size with greater than 50% respiratory variability, suggesting right atrial pressure of 3 mmHg.  LEFT VENTRICLE PLAX 2D LVIDd:         4.20 cm LVIDs:         3.00 cm LV PW:         1.50 cm LV IVS:        1.80 cm LVOT diam:     2.40 cm LV SV:         68 LV SV Index:   26 LVOT Area:     4.52 cm  RIGHT VENTRICLE RV S prime:     11.10 cm/s TAPSE (M-mode): 1.8 cm LEFT ATRIUM             Index       RIGHT ATRIUM           Index LA diam:        4.00 cm 1.51 cm/m  RA Area:     18.70 cm LA Vol (A2C):   76.3 ml 28.75 ml/m RA Volume:   47.60 ml  17.94 ml/m LA Vol (A4C):   73.1 ml 27.55 ml/m LA Biplane Vol: 78.2 ml 29.47 ml/m  AORTIC VALVE LVOT Vmax:   106.15 cm/s LVOT Vmean:  72.500 cm/s LVOT VTI:    0.151 m  AORTA Ao Root diam: 3.80 cm Ao Asc diam:  3.30 cm  SHUNTS Systemic VTI:  0.15 m Systemic Diam: 2.40 cm Olga Millers MD Electronically signed by Olga Millers MD Signature Date/Time: 08/20/2020/1:16:39 PM    Final      Medications:     Scheduled Medications: . albuterol  2.5 mg Inhalation Q6H  . atorvastatin  40 mg Oral QHS  . docusate sodium  100 mg Oral BID  . doxazosin  2 mg Oral Daily  . ferrous sulfate  325 mg Oral Daily  . folic acid  1 mg Oral Daily  . hydroxypropyl methylcellulose / hypromellose  1 drop Both Eyes Daily  . insulin aspart  0-15 Units Subcutaneous TID WC  . labetalol  400 mg Oral q12n4p  . magnesium oxide  400 mg Oral Daily  . multivitamin with minerals  1 tablet Oral Daily  .  pantoprazole  40 mg Oral Daily  . potassium chloride SA  60 mEq Oral Daily  . rivaroxaban  20 mg Oral Q supper  . thiamine  100 mg Oral Daily   Or  . thiamine  100 mg Intravenous Daily    Infusions: . sodium chloride 20 mL/hr at 08/21/20 0100  . diltiazem (CARDIZEM) infusion 10 mg/hr (08/21/20 0931)    PRN Medications: acetaminophen, LORazepam **OR** LORazepam, methocarbamol, ondansetron (ZOFRAN) IV   Assessment/Plan   1. Chronic diastolic CHF: Echo in 10/20 with EF 65-70%, RV interpreted as normal but suspect significant RV dysfunction.  - he has had multiple admissions in the past for a/c HF in the setting of poor compliance w/ meds and dietary indiscretion w/ sodium. Now being admitted for a/c CHF in the setting of rapid afib after drug binge w/ ETOH and cocaine.  - Renal function remains elevated. Off diuretics. Need to give back some IV fluids. Will give 1 liter over the next 4 hours.  2. Paroxsymal Atrial fibrillation:  - presented w afib w/ RVR after drug binge w/ ETOH and cocaine.  - Remains in A fib RVR.  - continue on Cardizem gtt for rate control  - Continue  Xarelto 20 mg daily. (  missed several days at home) - If no spontaneous conversion, will need TEE guided DCCV. Plan for later today.  3. H/o PE: 5/20, continue Xarelto.  4. OHS/OSA:CPAP QHS  5. HTN:  Stable.  6. . Polysubstance Use -  UDS + for cocaine and THC  8. Situational Depression  - management per IM  9. Hypokalemia/ Hypomagnesemia  -Supp K  Mag ok.   10. AKI - SCr 1.59 on admit, bumped to 2.5. Holding diuretics. Give back some fluid.  11. Chest Pain w/ mildly elevated Hs trop - hs trop 38>>86>>208>>288>>339 - 2D echo shows normal EF and no RWMAs - likely demand ischemia from rapid afib/ fluid overload and cocaine use however given trend, will need  2 day nuclear stress test  12. DM - on insulin. Management per IM   TEE/DC-CV later today. Will need HF Paramedicine at d/c. Will notify Katie.    Length of Stay: 2  Tonye Becket, NP  08/21/2020, 9:41 AM  Advanced Heart Failure Team Pager [phone] (M-F; 7a - 4p)  Please contact [facility] for night-coverage after hours (4p -7a ) and weekends on [email or web address]  Patient seen with NP, agree with the above note.   He is now s/p TEE-guided DCCV, now in NSR in 70s.  Denies dyspnea, chest pain.  Creatinine higher at 2.5.   General: NAD, obese.  Neck: Thick, No JVD, no thyromegaly or thyroid nodule.  Lungs: Clear to auscultation bilaterally with normal respiratory effort. CV: Nondisplaced PMI.  Heart regular S1/S2, no S3/S4, no murmur.  No peripheral edema.   Abdomen: Soft, nontender, no hepatosplenomegaly, no distention.  Skin: Intact without lesions or rashes.  Neurologic: Alert and oriented x 3.  Psych: Normal affect. Extremities: No clubbing or cyanosis.  HEENT: Normal.   He is now back in NSR, think atrial fibrillation triggered by cocaine/ETOH binge.  TEE showed severe LVH, EF 60-65%, mildly decreased RV systolic function.  - Avoid ETOH/cocaine.  - Continue Xarelto  - Can stop diltiazem gtt, start diltiazem CD 240 mg daily  He does not look volume overloaded.  Creatinine up to 2.5.  Hold Lasix and losartan.   He had some chest pain prior to admission and had mildly elevated HS-TnI (without marked trend).  Possible demand ischemia with afib/RVR, but given the chest pain, think we need to evaluate.  - Arrange for 2-day Cardiolite.   Marca Ancona 08/21/2020 4:20 PM

## 2020-08-21 NOTE — Transfer of Care (Signed)
Immediate Anesthesia Transfer of Care Note  Patient: KWANE ROHL  Procedure(s) Performed: TRANSESOPHAGEAL ECHOCARDIOGRAM (TEE) (N/A ) CARDIOVERSION (N/A )  Patient Location: PACU  Anesthesia Type:General  Level of Consciousness: awake  Airway & Oxygen Therapy: Patient Spontanous Breathing and Patient connected to nasal cannula oxygen  Post-op Assessment: Report given to RN and Post -op Vital signs reviewed and stable  Post vital signs: Reviewed and stable  Last Vitals:  Vitals Value Taken Time  BP 98/70 08/21/20 1625  Temp 36.5 C 08/21/20 1624  Pulse 84 08/21/20 1630  Resp 16 08/21/20 1630  SpO2 96 % 08/21/20 1630  Vitals shown include unvalidated device data.  Last Pain:  Vitals:   08/21/20 1624  TempSrc: Temporal  PainSc: 0-No pain      Patients Stated Pain Goal: 0 (08/21/20 0742)  Complications: No complications documented.

## 2020-08-21 NOTE — Progress Notes (Addendum)
Nutrition Education Note  RD consulted for nutrition education regarding CHF and diabetes. Spoke with patient over the phone to discuss a heart healthy, carbohydrate modified diet. He was unable to hear over the phone, stating "the phone is messed up." Also tried cell phone number, which went straight to voicemail.   Lab Results  Component Value Date   HGBA1C 6.1 (H) 08/19/2020     RD attached "Heart Healthy, Consistent Carbohydrate Nutrition Therapy" handout from the Academy of [employer] Dietetics to patient's discharge instructions.Handout provides examples on ways to decrease sodium intake in diet, decreasing intake of processed foods and use of salt shaker. Encourages fresh fruits and vegetables as well as whole grain sources of carbohydrates to maximize fiber intake.   Expect fair compliance.  Body mass index is 49.93 kg/m. Pt meets criteria for class 3, extreme/morbid obesity based on current BMI.  Current diet order is NPO for procedure; previously on a heart healthy carbohydrate modified diet, consuming 100% of meals. Labs and medications reviewed. No further nutrition interventions warranted at this time. RD contact information provided. If additional nutrition issues arise, please re-consult RD.   Benjamin [name], RD, LDN, CNSC Please refer to [facility] for contact information.

## 2020-08-21 NOTE — Progress Notes (Addendum)
  Echocardiogram Echocardiogram transesophageal has been performed.  Gerda Diss 08/21/2020, 4:53 PM

## 2020-08-21 NOTE — Progress Notes (Signed)
Patient K+ level 3.3, relayed to Dr Carren Rang. No additional  K order given.

## 2020-08-21 NOTE — Progress Notes (Signed)
Patient has home CPAP at bedside setup and doesn't need any assistance applying mask.

## 2020-08-21 NOTE — Anesthesia Preprocedure Evaluation (Addendum)
Anesthesia Evaluation  Patient identified by MRN, date of birth, ID band Patient awake    Reviewed: Allergy & Precautions, NPO status , Patient's Chart, lab work & pertinent test results  History of Anesthesia Complications Negative for: history of anesthetic complications  Airway Mallampati: II  TM Distance: >3 FB Neck ROM: Full    Dental  (+) Dental Advisory Given, Missing   Pulmonary shortness of breath, sleep apnea , COPD,  COPD inhaler, Current Smoker and Patient abstained from smoking., PE (h/o PE) 08/19/2020 SARS coronavirus NEG   breath sounds clear to auscultation       Cardiovascular hypertension, Pt. on medications (-) angina+ dysrhythmias Atrial Fibrillation  Rhythm:Irregular Rate:Normal  08/20/2020 ECHO; EF 60-65%, severe LVH, no significant valvular abnormalities   Neuro/Psych Anxiety negative neurological ROS     GI/Hepatic GERD  Controlled,(+)     substance abuse (recent use)  cocaine use and marijuana use,   Endo/Other  diabetes (glu 111), Oral Hypoglycemic AgentsMorbid obesity  Renal/GU Renal InsufficiencyRenal disease (creat 2.52)     Musculoskeletal   Abdominal (+) + obese,   Peds  Hematology xarelto   Anesthesia Other Findings   Reproductive/Obstetrics                           Anesthesia Physical Anesthesia Plan  ASA: III  Anesthesia Plan: General   Post-op Pain Management:    Induction: Intravenous  PONV Risk Score and Plan: 2 and Ondansetron, Dexamethasone and Treatment may vary due to age or medical condition  Airway Management Planned: Natural Airway and Nasal Cannula  Additional Equipment: None  Intra-op Plan:   Post-operative Plan:   Informed Consent: I have reviewed the patients History and Physical, chart, labs and discussed the procedure including the risks, benefits and alternatives for the proposed anesthesia with the patient or authorized  representative who has indicated his/her understanding and acceptance.     Dental advisory given  Plan Discussed with: CRNA and Surgeon  Anesthesia Plan Comments: (Plan routine monitors, MAC through TEE, convert to GA for cardioversion )       Anesthesia Quick Evaluation

## 2020-08-21 NOTE — Procedures (Signed)
Electrical Cardioversion Procedure Note Benjamin [name] [id number] 1966/09/13  Procedure: Electrical Cardioversion Indications:  Atrial Fibrillation  Procedure Details Consent: Risks of procedure as well as the alternatives and risks of each were explained to the (patient/caregiver).  Consent for procedure obtained. Time Out: Verified patient identification, verified procedure, site/side was marked, verified correct patient position, special equipment/implants available, medications/allergies/relevent history reviewed, required imaging and test results available.  Performed  Patient placed on cardiac monitor, pulse oximetry, supplemental oxygen as necessary.  Sedation given: Propofol per anesthesiology Pacer pads placed anterior and posterior chest.  Cardioverted 1 time(s).  Cardioverted at 200J.  Evaluation Findings: Post procedure EKG shows: NSR Complications: None Patient did tolerate procedure well.   Benjamin [name] 08/21/2020, 4:11 PM

## 2020-08-21 NOTE — CV Procedure (Signed)
Procedure: TEE  Sedation: Per anesthesiology.   Indication: Atrial fibrillation.   Findings: Please see echo section for full report.  Normal LV size with severe LV hypertrophy.  EF 60-65%.  Normal wall motion.  Normal RV size with mildly decreased systolic function.  Mildly dilated left atrium, no LA appendage thrombus.  Mildly dilated right atrium.  No PFO/ASD by color doppler.  Trivial TR, peak RV-RA gradient 26 mmHg.  Trivial MR.  Trileaflet aortic valve, no stenosis or regurgitation.  Normal caliber thoracic aorta with no significant plaque.   May proceed to DCCV.   Marca Ancona 08/21/2020 4:10 PM

## 2020-08-21 NOTE — Interval H&P Note (Signed)
History and Physical Interval Note:  08/21/2020 3:57 PM  Benjamin [name]  has presented today for surgery, with the diagnosis of afib.  The various methods of treatment have been discussed with the patient and family. After consideration of risks, benefits and other options for treatment, the patient has consented to  Procedure(s): TRANSESOPHAGEAL ECHOCARDIOGRAM (TEE) (N/A) CARDIOVERSION (N/A) as a surgical intervention.  The patient's history has been reviewed, patient examined, no change in status, stable for surgery.  I have reviewed the patient's chart and labs.  Questions were answered to the patient's satisfaction.     Prem Coykendall [employer]

## 2020-08-21 NOTE — Progress Notes (Signed)
Inpatient Diabetes Program Recommendations  AACE/ADA: New Consensus Statement on Inpatient Glycemic Control (2015)  Target Ranges:  Prepandial:   less than 140 mg/dL      Peak postprandial:   less than 180 mg/dL (1-2 hours)      Critically ill patients:  140 - 180 mg/dL   Lab Results  Component Value Date   GLUCAP 111 (H) 08/21/2020   HGBA1C 6.1 (H) 08/19/2020    Review of Glycemic Control Results for LIMUEL, NIEBLAS (MRN [id number]) as of 08/21/2020 13:24  Ref. Range 08/20/2020 21:39 08/21/2020 08:28 08/21/2020 12:02  Glucose-Capillary Latest Ref Range: 70 - 99 mg/dL 128 (H) 786 (H) 767 (H)   Diabetes history: Type 2 DM Outpatient Diabetes medications: Metformin 500 mg BID Current orders for Inpatient glycemic control: Novolog 0-15 units TID  Inpatient Diabetes Program Recommendations:    Noted consult. Patient is followed by [facility] for outpatient diabetes management. Verifies taking Metformin BID.  Reviewed patient's current A1c of 6.1% which is at goal per ADA. Explained what a A1c is and what it measures. Also reviewed goal A1c with patient, importance of good glucose control @ home, and blood sugar goals. Reviewed patho of DM, need for continued monitoring, follow up, recommended frequency of CBG checks on oral agents, vascular changes associated from poor control and commorbidities.  Patient needs a meter at discharge. Blood glucose meter (inlcudes lancets and strips)(#[id number]). Encouraged to check atleast one time per day. Patient was checking daily, until he recently ran out of testing strips.  Patient states, "I could do better with my portion sizes of CHO, but I will be honest I have been worse since my mom died recently." Reviewed plate method, carb counting and encouraged goal setting to get back on track. Denies drinking sugary beverages. Dietitian consult already placed.  Patient plans to continue to follow up at [facility]. Has no further questions at this time.    Thanks, Lujean Rave, MSN, RNC-OB Diabetes Coordinator [phone] (8a-5p)

## 2020-08-21 NOTE — Progress Notes (Signed)
PROGRESS NOTE    Benjamin [name]  IFB:[id number] DOB: 1965-11-21 DOA: 08/19/2020 PCP: Claiborne Rigg, NP     Brief Narrative:  54 y.o.BM PMHx Chronic Diastolic CHF, Chronic A. fib, HTN, chronic hypoxic respiratory failure on 3 L O2 at home, PE, CKD stage II, Chronic hypokalemia, Morbid obesity, OSA/OHS DM type II, substance abuse, EtOH abuse,  Presented with increasing short of breath and weakness.  Patient claimed that he was on a trip out of town over the weekend, and forgot to take all his CHF medications along with him, as a result, he has not been taking any of his medication for last 3 days. Now being admitted for a/c CHF in the setting of rapid afib after drug binge w/ ETOH and cocaine.  This morning, he started to feel extremely shortness of breath, heaviness in the chest, denied any cough, no no fever chills. ED Course: Patient was found to be in rapid A. fib, Cardizem drip started.  Chest x-ray showed chronic lung vasculature congestion.   Subjective: 12/3  A/O x4, negative CP, negative S OB.   Assessment & Plan: Covid vaccination; vaccinated   Active Problems:   Morbid obesity with BMI of 50.0-59.9, adult (HCC)   Chronic diastolic CHF (congestive heart failure) (HCC)   PAF (paroxysmal atrial fibrillation) (HCC)   OSA on CPAP   Acute on chronic diastolic CHF (congestive heart failure) (HCC)   Hypertension   Diabetic neuropathy (HCC)   Obesity hypoventilation syndrome (HCC)   Atrial fibrillation with rapid ventricular response (HCC)   Polysubstance abuse (HCC)   Alcohol abuse   A. fib with RVR -Multifactorial; noncompliance, EtOH abuse, cocaine abuse, OHS/OSA. -Rivaroxaban -Cardizem drip -Doxazosin 2 mg daily -Labetalol 400 mg BID -Per cardiology treat afib through rate control. If no spontaneous conversion, will need TEE guided DCCV  -Spironolactone 50 mg daily -Hold Torsemide  Acute on Chronic Diastolic CHF -See A. fib RVR -Strict in and out -Daily  weight  Primary HTN -See A. fib RVR  Acute on CKD stage II (baseline Cr~1.21) -Multifactorial prerenal secondary to cardiorenal syndrome, uncontrolled A. fib, continued diuresis, drug abuse. -Correct underlying causes Lab Results  Component Value Date   CREATININE 2.52 (H) 08/21/2020   CREATININE 2.60 (H) 08/20/2020   CREATININE 2.04 (H) 08/20/2020   CREATININE 1.59 (H) 08/19/2020   CREATININE 1.21 07/03/2020  -We will monitor for improvement with improvement in A. fib, clearance of toxins from system.  Hold diuretics  DM type II controlled with complication -Hold home medication -Moderate SSI -DM coordinator consult; patient with poor impulse control, and poor understanding of his disease process requires education -Nutrition consult;patient with poor impulse control, and poor understanding of his disease process requires nutritional education  Polysubstance abuse/EtOH abuse -12/1 urine tox screen positive cocaine, marijuana -12/1 EtOH screen pending -CIWA protocol -LCSW consult substance abuse resources  Hypokalemia -Potassium goal> 4 -K-Dur 100 mEq  OSA/OHS -CPAP per respiratory  Morbidly obese (BMI 47.9 kg/m) -Patient would benefit from bariatric surgery consult as outpatient    DVT prophylaxis: Rivaroxaban Code Status: Full Family Communication:   Status is: Inpatient    Dispo: The patient is from: Home              Anticipated d/c is to: Home              Anticipated d/c date is: 12/7              Patient currently unstable  Consultants:  Cardiology  Procedures/Significant Events:    I have personally reviewed and interpreted all radiology studies and my findings are as above.  VENTILATOR SETTINGS:    Cultures   Antimicrobials:    Devices    LINES / TUBES:      Continuous Infusions: . sodium chloride 20 mL/hr at 08/21/20 0100  . diltiazem (CARDIZEM) infusion 10 mg/hr (08/21/20 0931)     Objective: Vitals:    08/21/20 0716 08/21/20 0830 08/21/20 1205 08/21/20 1502  BP:  100/87 116/88 119/83  Pulse: (!) 105 83 94 (!) 118  Resp: 20 19 18 20   Temp:  97.9 F (36.6 C) 97.7 F (36.5 C) 97.7 F (36.5 C)  TempSrc:  Oral Oral Temporal  SpO2: 98% 98% 97% 97%  Weight:    (!) 160.1 kg  Height:    5\' 11"  (1.803 m)    Intake/Output Summary (Last 24 hours) at 08/21/2020 1531 Last data filed at 08/21/2020 0500 Gross per 24 hour  Intake 86.62 ml  Output 1450 ml  Net -1363.38 ml   Filed Weights   08/20/20 0450 08/21/20 0605 08/21/20 1502  Weight: (!) 155.9 kg (!) 162.4 kg (!) 160.1 kg    Examination:  General: A/O x4+ acute on chronic respiratory distress Eyes: negative scleral hemorrhage, negative anisocoria, negative icterus ENT: Negative Runny nose, negative gingival bleeding, Neck:  Negative scars, masses, torticollis, lymphadenopathy, JVD Lungs: decreased breath sounds bilaterally without wheezes or crackles Cardiovascular: Regular rhythm and rate without murmur gallop or rub normal S1 and S2 Abdomen: MORBIDLY OBESE, negative abdominal pain, nondistended, positive soft, bowel sounds, no rebound, no ascites, no appreciable mass Extremities: No significant cyanosis, clubbing, or edema bilateral lower extremities Skin: Negative rashes, lesions, ulcers Psychiatric: Positive depression, negative anxiety, negative fatigue, negative mania  Central nervous system:  Cranial nerves II through XII intact, tongue/uvula midline, all extremities muscle strength 5/5, sensation intact throughout, negative dysarthria, negative expressive aphasia, negative receptive aphasia.  .     Data Reviewed: Care during the described time interval was provided by me .  I have reviewed this patient's available data, including medical history, events of note, physical examination, and all test results as part of my evaluation.  CBC: Recent Labs  Lab 08/19/20 1018 08/19/20 1120 08/20/20 2127 08/21/20 0250  WBC 5.9   --  10.0 9.2  NEUTROABS  --   --   --  5.1  HGB 14.7 17.7* 14.0 14.7  HCT 50.2 52.0 47.2 46.7  MCV 76.2*  --  75.4* 74.8*  PLT 389  --  396 396   Basic Metabolic Panel: Recent Labs  Lab 08/19/20 1018 08/19/20 1120 08/19/20 1617 08/20/20 0736 08/20/20 2127 08/21/20 0250  NA 138 137  --  136 134* 137  K 3.1* 3.1*  --  3.3* 3.1* 3.3*  CL 96*  --   --  90* 91* 94*  CO2 27  --   --  33* 31 31  GLUCOSE 140*  --   --  121* 126* 128*  BUN 19  --   --  26* 39* 39*  CREATININE 1.59*  --   --  2.04* 2.60* 2.52*  CALCIUM 9.4  --   --  8.8* 8.7* 9.0  MG  --   --  1.7  --  2.1 2.2  PHOS  --   --   --   --  5.2* 4.9*   GFR: Estimated Creatinine Clearance: 51.8 mL/min (A) (by C-G formula based on SCr  of 2.52 mg/dL (H)). Liver Function Tests: Recent Labs  Lab 08/19/20 1018 08/20/20 2127 08/21/20 0250  AST ALT ALKPHOS 52 45 48  BILITOT 0.5 0.5 0.3  PROT 7.7 7.2 7.5  ALBUMIN 3.5 3.2* 3.3*   No results for input(s): LIPASE, AMYLASE in the last 168 hours. No results for input(s): AMMONIA in the last 168 hours. Coagulation Profile: No results for input(s): INR, PROTIME in the last 168 hours. Cardiac Enzymes: No results for input(s): CKTOTAL, CKMB, CKMBINDEX, TROPONINI in the last 168 hours. BNP (last 3 results) No results for input(s): PROBNP in the last 8760 hours. HbA1C: Recent Labs    08/19/20 1617  HGBA1C 6.1*   CBG: Recent Labs  Lab 08/20/20 1240 08/20/20 1642 08/20/20 2139 08/21/20 0828 08/21/20 1202  GLUCAP 100* 178* 165* 104* 111*   Lipid Profile: No results for input(s): CHOL, HDL, LDLCALC, TRIG, CHOLHDL, LDLDIRECT in the last 72 hours. Thyroid Function Tests: No results for input(s): TSH, T4TOTAL, FREET4, T3FREE, THYROIDAB in the last 72 hours. Anemia Panel: No results for input(s): VITAMINB12, FOLATE, FERRITIN, TIBC, IRON, RETICCTPCT in the last 72 hours. Sepsis Labs: No results for input(s): PROCALCITON, LATICACIDVEN in the last 168  hours.  Recent Results (from the past 240 hour(s))  Resp Panel by RT-PCR (Flu A&B, Covid) Nasopharyngeal Swab     Status: None   Collection Time: 08/19/20  2:08 PM   Specimen: Nasopharyngeal Swab; Nasopharyngeal(NP) swabs in vial transport medium  Result Value Ref Range Status   SARS Coronavirus 2 by RT PCR NEGATIVE NEGATIVE Final    Comment: (NOTE) SARS-CoV-2 target nucleic acids are NOT DETECTED.  The SARS-CoV-2 RNA is generally detectable in upper respiratory specimens during the acute phase of infection. The lowest concentration of SARS-CoV-2 viral copies this assay can detect is 138 copies/mL. A negative result does not preclude SARS-Cov-2 infection and should not be used as the sole basis for treatment or other patient management decisions. A negative result may occur with  improper specimen collection/handling, submission of specimen other than nasopharyngeal swab, presence of viral mutation(s) within the areas targeted by this assay, and inadequate number of viral copies(<138 copies/mL). A negative result must be combined with clinical observations, patient history, and epidemiological information. The expected result is Negative.  Fact Sheet for Patients:  BloggerCourse.com  Fact Sheet for Healthcare Providers:  SeriousBroker.it  This test is no t yet approved or cleared by the [place] FDA and  has been authorized for detection and/or diagnosis of SARS-CoV-2 by FDA under an Emergency Use Authorization (EUA). This EUA will remain  in effect (meaning this test can be used) for the duration of the COVID-19 declaration under Section 564(b)(1) of the Act, 21 U.S.C.section 360bbb-3(b)(1), unless the authorization is terminated  or revoked sooner.       Influenza A by PCR NEGATIVE NEGATIVE Final   Influenza B by PCR NEGATIVE NEGATIVE Final    Comment: (NOTE) The Xpert Xpress SARS-CoV-2/FLU/RSV plus assay is intended  as an aid in the diagnosis of influenza from Nasopharyngeal swab specimens and should not be used as a sole basis for treatment. Nasal washings and aspirates are unacceptable for Xpert Xpress SARS-CoV-2/FLU/RSV testing.  Fact Sheet for Patients: BloggerCourse.com  Fact Sheet for Healthcare Providers: SeriousBroker.it  This test is not yet approved or cleared by the [place] FDA and has been authorized for detection and/or diagnosis of SARS-CoV-2 by FDA under an Emergency Use Authorization (EUA). This EUA will  remain in effect (meaning this test can be used) for the duration of the COVID-19 declaration under Section 564(b)(1) of the Act, 21 U.S.C. section 360bbb-3(b)(1), unless the authorization is terminated or revoked.  Performed at [facility], [address] [place], [place] [address]          Radiology Studies: ECHOCARDIOGRAM COMPLETE  Result Date: 08/20/2020    ECHOCARDIOGRAM REPORT   Patient Name:   Benjamin [name] Date of Exam: 08/20/2020 Medical Rec #:  [id number]          Height:       71.0 in Accession #:    [id number]         Weight:       343.6 lb Date of Birth:  12-06-1965          BSA:          2.654 m Patient Age:    54 years           BP:           118/60 mmHg Patient Gender: M                  HR:           82 bpm. Exam Location:  Inpatient Procedure: 2D Echo, Color Doppler, Cardiac Doppler and Intracardiac            Opacification Agent Indications:    I50.31 Acute diastolic (congestive) heart failure  History:        Patient has prior history of Echocardiogram examinations, most                 recent 07/17/2019. CHF, Arrythmias:Atrial Fibrillation; Risk                 Factors:Hypertension, Diabetes, Dyslipidemia and Sleep Apnea.  Sonographer:    Irving Burton Senior RDCS Referring Phys: [id number] Roxy Horseman SIMMONS  Sonographer Comments: Technically difficult study due to poor echo windows. IMPRESSIONS  1. Left  ventricular ejection fraction, by estimation, is 60 to 65%. The left ventricle has normal function. The left ventricle has no regional wall motion abnormalities. There is severe left ventricular hypertrophy. Left ventricular diastolic function could not be evaluated.  2. Right ventricular systolic function is normal. The right ventricular size is normal.  3. The mitral valve is normal in structure. No evidence of mitral valve regurgitation. No evidence of mitral stenosis.  4. The aortic valve has an indeterminant number of cusps. Aortic valve regurgitation is not visualized. No aortic stenosis is present.  5. Aortic dilatation noted. There is mild dilatation of the aortic root, measuring 38 mm.  6. The inferior vena cava is normal in size with greater than 50% respiratory variability, suggesting right atrial pressure of 3 mmHg. FINDINGS  Left Ventricle: Left ventricular ejection fraction, by estimation, is 60 to 65%. The left ventricle has normal function. The left ventricle has no regional wall motion abnormalities. Definity contrast agent was given IV to delineate the left ventricular  endocardial borders. The left ventricular internal cavity size was normal in size. There is severe left ventricular hypertrophy. Left ventricular diastolic function could not be evaluated due to atrial fibrillation. Left ventricular diastolic function could not be evaluated. Right Ventricle: The right ventricular size is normal. Right ventricular systolic function is normal. Left Atrium: Left atrial size was normal in size. Right Atrium: Right atrial size was normal in size. Pericardium: There is no evidence of pericardial effusion. Mitral Valve: The mitral  valve is normal in structure. No evidence of mitral valve regurgitation. No evidence of mitral valve stenosis. Tricuspid Valve: The tricuspid valve is normal in structure. Tricuspid valve regurgitation is not demonstrated. No evidence of tricuspid stenosis. Aortic Valve: The  aortic valve has an indeterminant number of cusps. Aortic valve regurgitation is not visualized. No aortic stenosis is present. Pulmonic Valve: The pulmonic valve was not well visualized. Pulmonic valve regurgitation is not visualized. No evidence of pulmonic stenosis. Aorta: Aortic dilatation noted. There is mild dilatation of the aortic root, measuring 38 mm. Venous: The inferior vena cava is normal in size with greater than 50% respiratory variability, suggesting right atrial pressure of 3 mmHg.  LEFT VENTRICLE PLAX 2D LVIDd:         4.20 cm LVIDs:         3.00 cm LV PW:         1.50 cm LV IVS:        1.80 cm LVOT diam:     2.40 cm LV SV:         68 LV SV Index:   26 LVOT Area:     4.52 cm  RIGHT VENTRICLE RV S prime:     11.10 cm/s TAPSE (M-mode): 1.8 cm LEFT ATRIUM             Index       RIGHT ATRIUM           Index LA diam:        4.00 cm 1.51 cm/m  RA Area:     18.70 cm LA Vol (A2C):   76.3 ml 28.75 ml/m RA Volume:   47.60 ml  17.94 ml/m LA Vol (A4C):   73.1 ml 27.55 ml/m LA Biplane Vol: 78.2 ml 29.47 ml/m  AORTIC VALVE LVOT Vmax:   106.15 cm/s LVOT Vmean:  72.500 cm/s LVOT VTI:    0.151 m  AORTA Ao Root diam: 3.80 cm Ao Asc diam:  3.30 cm  SHUNTS Systemic VTI:  0.15 m Systemic Diam: 2.40 cm Olga Millers MD Electronically signed by Olga Millers MD Signature Date/Time: 08/20/2020/1:16:39 PM    Final         Scheduled Meds: . [MAR Hold] albuterol  2.5 mg Inhalation Q6H  . [MAR Hold] atorvastatin  40 mg Oral QHS  . [MAR Hold] docusate sodium  100 mg Oral BID  . [MAR Hold] doxazosin  2 mg Oral Daily  . [MAR Hold] ferrous sulfate  325 mg Oral Daily  . [MAR Hold] folic acid  1 mg Oral Daily  . [MAR Hold] hydroxypropyl methylcellulose / hypromellose  1 drop Both Eyes Daily  . [MAR Hold] insulin aspart  0-15 Units Subcutaneous TID WC  . [MAR Hold] labetalol  400 mg Oral q12n4p  . [MAR Hold] magnesium oxide  400 mg Oral Daily  . [MAR Hold] multivitamin with minerals  1 tablet Oral Daily    . [MAR Hold] pantoprazole  40 mg Oral Daily  . [MAR Hold] potassium chloride SA  60 mEq Oral Daily  . [MAR Hold] rivaroxaban  20 mg Oral Q supper  . [MAR Hold] thiamine  100 mg Oral Daily   Or  . [MAR Hold] thiamine  100 mg Intravenous Daily   Continuous Infusions: . sodium chloride 20 mL/hr at 08/21/20 0100  . diltiazem (CARDIZEM) infusion 10 mg/hr (08/21/20 0931)     LOS: 2 days    Time spent:40 min    Austyn Seier, Roselind Messier, MD [facility]  Pager [phone]  If 7PM-7AM, please contact night-coverage [email or web address] Password [facility] 08/21/2020, 3:31 PM

## 2020-08-22 ENCOUNTER — Inpatient Hospital Stay (HOSPITAL_COMMUNITY): Payer: Medicaid Other

## 2020-08-22 DIAGNOSIS — I10 Essential (primary) hypertension: Secondary | ICD-10-CM

## 2020-08-22 DIAGNOSIS — I4891 Unspecified atrial fibrillation: Secondary | ICD-10-CM | POA: Diagnosis not present

## 2020-08-22 DIAGNOSIS — R079 Chest pain, unspecified: Secondary | ICD-10-CM

## 2020-08-22 DIAGNOSIS — I5032 Chronic diastolic (congestive) heart failure: Secondary | ICD-10-CM | POA: Diagnosis not present

## 2020-08-22 DIAGNOSIS — I5033 Acute on chronic diastolic (congestive) heart failure: Secondary | ICD-10-CM | POA: Diagnosis not present

## 2020-08-22 DIAGNOSIS — N179 Acute kidney failure, unspecified: Secondary | ICD-10-CM

## 2020-08-22 DIAGNOSIS — F1010 Alcohol abuse, uncomplicated: Secondary | ICD-10-CM | POA: Diagnosis not present

## 2020-08-22 LAB — CBC WITH DIFFERENTIAL/PLATELET
Abs Immature Granulocytes: 0.02 10*3/uL (ref 0.00–0.07)
Basophils Absolute: 0 10*3/uL (ref 0.0–0.1)
Basophils Relative: 0 %
Eosinophils Absolute: 0.3 10*3/uL (ref 0.0–0.5)
Eosinophils Relative: 4 %
HCT: 45 % (ref 39.0–52.0)
Hemoglobin: 13.4 g/dL (ref 13.0–17.0)
Immature Granulocytes: 0 %
Lymphocytes Relative: 31 %
Lymphs Abs: 2.3 10*3/uL (ref 0.7–4.0)
MCH: 22.5 pg — ABNORMAL LOW (ref 26.0–34.0)
MCHC: 29.8 g/dL — ABNORMAL LOW (ref 30.0–36.0)
MCV: 75.5 fL — ABNORMAL LOW (ref 80.0–100.0)
Monocytes Absolute: 1.1 10*3/uL — ABNORMAL HIGH (ref 0.1–1.0)
Monocytes Relative: 14 %
Neutro Abs: 3.8 10*3/uL (ref 1.7–7.7)
Neutrophils Relative %: 51 %
Platelets: 382 10*3/uL (ref 150–400)
RBC: 5.96 MIL/uL — ABNORMAL HIGH (ref 4.22–5.81)
RDW: 19.7 % — ABNORMAL HIGH (ref 11.5–15.5)
WBC: 7.5 10*3/uL (ref 4.0–10.5)
nRBC: 0 % (ref 0.0–0.2)

## 2020-08-22 LAB — COMPREHENSIVE METABOLIC PANEL
ALT: 16 U/L (ref 0–44)
AST: 16 U/L (ref 15–41)
Albumin: 3.2 g/dL — ABNORMAL LOW (ref 3.5–5.0)
Alkaline Phosphatase: 43 U/L (ref 38–126)
Anion gap: 11 (ref 5–15)
BUN: 43 mg/dL — ABNORMAL HIGH (ref 6–20)
CO2: 32 mmol/L (ref 22–32)
Calcium: 9 mg/dL (ref 8.9–10.3)
Chloride: 93 mmol/L — ABNORMAL LOW (ref 98–111)
Creatinine, Ser: 1.91 mg/dL — ABNORMAL HIGH (ref 0.61–1.24)
GFR, Estimated: 41 mL/min — ABNORMAL LOW (ref >60)
Glucose, Bld: 116 mg/dL — ABNORMAL HIGH (ref 70–99)
Potassium: 3.5 mmol/L (ref 3.5–5.1)
Sodium: 136 mmol/L (ref 135–145)
Total Bilirubin: 0.5 mg/dL (ref 0.3–1.2)
Total Protein: 7 g/dL (ref 6.5–8.1)

## 2020-08-22 LAB — GLUCOSE, CAPILLARY
Glucose-Capillary: 116 mg/dL — ABNORMAL HIGH (ref 70–99)
Glucose-Capillary: 102 mg/dL — ABNORMAL HIGH (ref 70–99)
Glucose-Capillary: 95 mg/dL (ref 70–99)
Glucose-Capillary: 127 mg/dL — ABNORMAL HIGH (ref 70–99)

## 2020-08-22 LAB — PHOSPHORUS: Phosphorus: 3.8 mg/dL (ref 2.5–4.6)

## 2020-08-22 LAB — MAGNESIUM: Magnesium: 1.8 mg/dL (ref 1.7–2.4)

## 2020-08-22 MED ORDER — REGADENOSON 0.4 MG/5ML IV SOLN
0.4000 mg | Freq: Once | INTRAVENOUS | Status: AC
  Filled 2020-08-22: qty 5

## 2020-08-22 MED ORDER — TECHNETIUM TC 99M TETROFOSMIN IV KIT
32.8000 | PACK | Freq: Once | INTRAVENOUS | Status: AC | PRN
  Administered 2020-08-22: 32.8 via INTRAVENOUS

## 2020-08-22 MED ORDER — POTASSIUM CHLORIDE CRYS ER 20 MEQ PO TBCR
50.0000 meq | EXTENDED_RELEASE_TABLET | Freq: Once | ORAL | Status: AC
  Administered 2020-08-22: 50 meq via ORAL
  Filled 2020-08-22: qty 1

## 2020-08-22 MED ORDER — ALBUTEROL SULFATE (2.5 MG/3ML) 0.083% IN NEBU: 2.5000 mg | INHALATION_SOLUTION | Freq: Two times a day (BID) | RESPIRATORY_TRACT | Status: DC | PRN

## 2020-08-22 MED ORDER — REGADENOSON 0.4 MG/5ML IV SOLN
INTRAVENOUS | Status: AC
  Administered 2020-08-22: 0.4 mg via INTRAVENOUS
  Filled 2020-08-22: qty 5

## 2020-08-22 MED ORDER — HYDRALAZINE HCL 20 MG/ML IJ SOLN
5.0000 mg | Freq: Four times a day (QID) | INTRAMUSCULAR | Status: DC | PRN
  Administered 2020-08-23: 5 mg via INTRAVENOUS
  Filled 2020-08-22: qty 1

## 2020-08-22 NOTE — Plan of Care (Signed)
  Problem: Education: Goal: Knowledge of disease or condition will improve Outcome: Progressing Goal: Understanding of medication regimen will improve Outcome: Progressing Goal: Individualized Educational Video(s) Outcome: Progressing   Problem: Activity: Goal: Ability to tolerate increased activity will improve Outcome: Progressing   Problem: Cardiac: Goal: Ability to achieve and maintain adequate cardiopulmonary perfusion will improve Outcome: Progressing   Problem: Health Behavior/Discharge Planning: Goal: Ability to safely manage health-related needs after discharge will improve Outcome: Progressing   Problem: Education: Goal: Ability to demonstrate management of disease process will improve Outcome: Progressing Goal: Ability to verbalize understanding of medication therapies will improve Outcome: Progressing Goal: Individualized Educational Video(s) Outcome: Progressing   Problem: Activity: Goal: Capacity to carry out activities will improve Outcome: Progressing   Problem: Cardiac: Goal: Ability to achieve and maintain adequate cardiopulmonary perfusion will improve Outcome: Progressing   Problem: Education: Goal: Knowledge of General Education information will improve Description: Including pain rating scale, medication(s)/side effects and non-pharmacologic comfort measures Outcome: Progressing

## 2020-08-22 NOTE — Progress Notes (Signed)
   Benjamin [name] presented for a nuclear stress test today.  No immediate complications.  Stress imaging is pending at this time.  Preliminary EKG findings may be listed in the chart, but the stress test result will not be finalized until perfusion imaging is complete.  2 day study - stress imaging completed 08/22/20. Will make NPO after MN in anticipation of rest imaging 08/23/20.  Beatriz Stallion, PA-C 08/22/2020, 1:07 PM

## 2020-08-22 NOTE — Progress Notes (Signed)
Patient has home CPAP unit at bedside. Patient stated he does not need any help with his machine.

## 2020-08-22 NOTE — Progress Notes (Signed)
PROGRESS NOTE    Benjamin DELAUGHTER  ZOX:[id number] DOB: Aug 29, 1966 DOA: 08/19/2020 PCP: Claiborne Rigg, NP     Brief Narrative:  54 y.o.BM PMHx Chronic Diastolic CHF, Chronic A. fib, HTN, chronic hypoxic respiratory failure on 3 L O2 at home, PE, CKD stage II, Chronic hypokalemia, Morbid obesity, OSA/OHS DM type II, substance abuse, EtOH abuse,  Presented with increasing short of breath and weakness.  Patient claimed that he was on a trip out of town over the weekend, and forgot to take all his CHF medications along with him, as a result, he has not been taking any of his medication for last 3 days. Now being admitted for a/c CHF in the setting of rapid afib after drug binge w/ ETOH and cocaine.  This morning, he started to feel extremely shortness of breath, heaviness in the chest, denied any cough, no no fever chills. ED Course: Patient was found to be in rapid A. fib, Cardizem drip started.  Chest x-ray showed chronic lung vasculature congestion.   Subjective: 12/4 afebrile overnight A/O x4, negative CP, negative SOP.  Laying comfortably on the bed.    Assessment & Plan: Covid vaccination; vaccinated   Active Problems:   Morbid obesity with BMI of 50.0-59.9, adult (HCC)   Chronic diastolic CHF (congestive heart failure) (HCC)   PAF (paroxysmal atrial fibrillation) (HCC)   OSA on CPAP   Acute on chronic diastolic CHF (congestive heart failure) (HCC)   Hypertension   Diabetic neuropathy (HCC)   Obesity hypoventilation syndrome (HCC)   Atrial fibrillation with rapid ventricular response (HCC)   Polysubstance abuse (HCC)   Alcohol abuse   A. fib with RVR -Multifactorial; noncompliance, EtOH abuse, cocaine abuse, OHS/OSA. -Rivaroxaban -Diltiazem 240 mg daily -Doxazosin 2 mg daily -Hydralazine PRN SBP> 140 or DBP>100 -Labetalol 400 mg BID -Spironolactone 50 mg daily -Hold Torsemide -12/3 s/p DCCV -12/4 s/p first part of stress test completed today.  Acute on Chronic  Diastolic CHF -See A. fib RVR -Strict in and out -Daily weight  Primary HTN -See A. fib RVR  Acute on CKD stage II (baseline Cr~1.21) -Multifactorial prerenal secondary to cardiorenal syndrome, uncontrolled A. fib, continued diuresis, drug abuse. -Correct underlying causes Lab Results  Component Value Date   CREATININE 1.91 (H) 08/22/2020   CREATININE 2.52 (H) 08/21/2020   CREATININE 2.60 (H) 08/20/2020   CREATININE 2.04 (H) 08/20/2020   CREATININE 1.59 (H) 08/19/2020  -We will monitor for improvement with improvement in A. fib, clearance of toxins from system.  Hold diuretics  DM type II controlled with complication -12/1 hemoglobin A1c= 6.1 -Hold home medication -Moderate SSI -DM coordinator consult; patient with poor impulse control, and poor understanding of his disease process requires education -Nutrition consult;patient with poor impulse control, and poor understanding of his disease process requires nutritional education  HLD -Lipid panel pending  Polysubstance abuse/EtOH abuse -12/1 urine tox screen positive cocaine, marijuana -12/1 EtOH screen pending -CIWA protocol -LCSW consult substance abuse resources  Hypokalemia -Potassium goal> 4 -K-Dur 60 mEq daily -12/4 K-Dur 50 mEq x 1  OSA/OHS -CPAP per respiratory  Morbidly obese (BMI 47.9 kg/m) -Patient would benefit from bariatric surgery consult as outpatient    DVT prophylaxis: Rivaroxaban Code Status: Full Family Communication:   Status is: Inpatient    Dispo: The patient is from: Home              Anticipated d/c is to: Home  Anticipated d/c date is: 12/7              Patient currently unstable      Consultants:  Cardiology    Procedures/Significant Events:  12/2 echocardiogram;Left Ventricle: Left ventricular ejection fraction, by estimation, is 60  to 65%. The left ventricle has normal function. The left ventricle has no  regional wall motion abnormalities. Definity  contrast agent was given IV  to delineate the left ventricular  endocardial borders. The left ventricular internal cavity size was normal  in size. There is severe left ventricular hypertrophy. Left ventricular  diastolic function could not be evaluated due to atrial fibrillation. Left  ventricular diastolic function  could not be evaluated.   Right Ventricle: The right ventricular size is normal. Right ventricular  systolic function is normal.   Left Atrium: Left atrial size was normal in size.   Right Atrium: Right atrial size was normal in size.   Pericardium: There is no evidence of pericardial effusion.   Mitral Valve: The mitral valve is normal in structure. No evidence of  mitral valve regurgitation. No evidence of mitral valve stenosis.   Tricuspid Valve: The tricuspid valve is normal in structure. Tricuspid  valve regurgitation is not demonstrated. No evidence of tricuspid  stenosis.   Aortic Valve: The aortic valve has an indeterminant number of cusps.  Aortic valve regurgitation is not visualized. No aortic stenosis is  present.   Pulmonic Valve: The pulmonic valve was not well visualized. Pulmonic valve  regurgitation is not visualized. No evidence of pulmonic stenosis.   Aorta: Aortic dilatation noted. There is mild dilatation of the aortic  root, measuring 38 mm.   Venous: The inferior vena cava is normal in size with greater than 50%  respiratory variability, suggesting right atrial pressure of 3 mmHg.    LEFT VENTRICLE  PLAX 2D  LVIDd:     4.20 cm  LVIDs:     3.00 cm  LV PW:     1.50 cm  LV IVS:    1.80 cm  LVOT diam:   2.40 cm  LV SV:     68  LV SV Index:  26  LVOT Area:   4.52 cm  12/3 DCCV   I have personally reviewed and interpreted all radiology studies and my findings are as above.  VENTILATOR SETTINGS:    Cultures   Antimicrobials:    Devices    LINES / TUBES:      Continuous Infusions:     Objective: Vitals:   08/22/20 1121 08/22/20 1123 08/22/20 1125 08/22/20 1228  BP: (!) 148/98 (!) 178/90 (!) 184/89 (!) 164/111  Pulse: 89 88 88 87  Resp:    20  Temp:    (!) 97.4 F (36.3 C)  TempSrc:    Axillary  SpO2:      Weight:      Height:        Intake/Output Summary (Last 24 hours) at 08/22/2020 1442 Last data filed at 08/22/2020 1118 Gross per 24 hour  Intake 1000 ml  Output 300 ml  Net 700 ml   Filed Weights   08/21/20 0605 08/21/20 1502 08/22/20 0552  Weight: (!) 162.4 kg (!) 160.1 kg (!) 161.1 kg    Examination:  General: A/O x4, negative chronic respiratory distress Eyes: negative scleral hemorrhage, negative anisocoria, negative icterus ENT: Negative Runny nose, negative gingival bleeding, Neck:  Negative scars, masses, torticollis, lymphadenopathy, JVD Lungs: decreased breath sounds bilaterally without wheezes or crackles Cardiovascular: Regular  rhythm and rate without murmur gallop or rub normal S1 and S2 Abdomen: MORBIDLY OBESE, negative abdominal pain, nondistended, positive soft, bowel sounds, no rebound, no ascites, no appreciable mass Extremities: No significant cyanosis, clubbing, or edema bilateral lower extremities Skin: Negative rashes, lesions, ulcers Psychiatric: Positive depression, negative anxiety, negative fatigue, negative mania  Central nervous system:  Cranial nerves II through XII intact, tongue/uvula midline, all extremities muscle strength 5/5, sensation intact throughout, negative dysarthria, negative expressive aphasia, negative receptive aphasia.  .     Data Reviewed: Care during the described time interval was provided by me .  I have reviewed this patient's available data, including medical history, events of note, physical examination, and all test results as part of my evaluation.  CBC: Recent Labs  Lab 08/19/20 1018 08/19/20 1120 08/20/20 2127 08/21/20 0250 08/22/20 0206  WBC 5.9  --  10.0 9.2 7.5  NEUTROABS  --   --    --  5.1 3.8  HGB 14.7 17.7* 14.0 14.7 13.4  HCT 50.2 52.0 47.2 46.7 45.0  MCV 76.2*  --  75.4* 74.8* 75.5*  PLT 389  --  396 396 382   Basic Metabolic Panel: Recent Labs  Lab 08/19/20 1018 08/19/20 1018 08/19/20 1120 08/19/20 1617 08/20/20 0736 08/20/20 2127 08/21/20 0250 08/22/20 0206  NA 138   < > 137  --  136 134* 137 136  K 3.1*   < > 3.1*  --  3.3* 3.1* 3.3* 3.5  CL 96*  --   --   --  90* 91* 94* 93*  CO2 27  --   --   --  33* 31 31 32  GLUCOSE 140*  --   --   --  121* 126* 128* 116*  BUN 19  --   --   --  26* 39* 39* 43*  CREATININE 1.59*  --   --   --  2.04* 2.60* 2.52* 1.91*  CALCIUM 9.4  --   --   --  8.8* 8.7* 9.0 9.0  MG  --   --   --  1.7  --  2.1 2.2 1.8  PHOS  --   --   --   --   --  5.2* 4.9* 3.8   < > = values in this interval not displayed.   GFR: Estimated Creatinine Clearance: 68.5 mL/min (A) (by C-G formula based on SCr of 1.91 mg/dL (H)). Liver Function Tests: Recent Labs  Lab 08/19/20 1018 08/20/20 2127 08/21/20 0250 08/22/20 0206  AST 23 17 16 16   ALT 16 14 15 16   ALKPHOS 52 45 48 43  BILITOT 0.5 0.5 0.3 0.5  PROT 7.7 7.2 7.5 7.0  ALBUMIN 3.5 3.2* 3.3* 3.2*   No results for input(s): LIPASE, AMYLASE in the last 168 hours. No results for input(s): AMMONIA in the last 168 hours. Coagulation Profile: No results for input(s): INR, PROTIME in the last 168 hours. Cardiac Enzymes: No results for input(s): CKTOTAL, CKMB, CKMBINDEX, TROPONINI in the last 168 hours. BNP (last 3 results) No results for input(s): PROBNP in the last 8760 hours. HbA1C: Recent Labs    08/19/20 1617  HGBA1C 6.1*   CBG: Recent Labs  Lab 08/21/20 0828 08/21/20 1202 08/21/20 1658 08/22/20 0745 08/22/20 1225  GLUCAP 104* 111* 92 116* 102*   Lipid Profile: No results for input(s): CHOL, HDL, LDLCALC, TRIG, CHOLHDL, LDLDIRECT in the last 72 hours. Thyroid Function Tests: No results for input(s): TSH, T4TOTAL, FREET4, T3FREE, THYROIDAB in  the last 72 hours.  Anemia Panel: No results for input(s): VITAMINB12, FOLATE, FERRITIN, TIBC, IRON, RETICCTPCT in the last 72 hours. Sepsis Labs: No results for input(s): PROCALCITON, LATICACIDVEN in the last 168 hours.  Recent Results (from the past 240 hour(s))  Resp Panel by RT-PCR (Flu A&B, Covid) Nasopharyngeal Swab     Status: None   Collection Time: 08/19/20  2:08 PM   Specimen: Nasopharyngeal Swab; Nasopharyngeal(NP) swabs in vial transport medium  Result Value Ref Range Status   SARS Coronavirus 2 by RT PCR NEGATIVE NEGATIVE Final    Comment: (NOTE) SARS-CoV-2 target nucleic acids are NOT DETECTED.  The SARS-CoV-2 RNA is generally detectable in upper respiratory specimens during the acute phase of infection. The lowest concentration of SARS-CoV-2 viral copies this assay can detect is 138 copies/mL. A negative result does not preclude SARS-Cov-2 infection and should not be used as the sole basis for treatment or other patient management decisions. A negative result may occur with  improper specimen collection/handling, submission of specimen other than nasopharyngeal swab, presence of viral mutation(s) within the areas targeted by this assay, and inadequate number of viral copies(<138 copies/mL). A negative result must be combined with clinical observations, patient history, and epidemiological information. The expected result is Negative.  Fact Sheet for Patients:  BloggerCourse.com  Fact Sheet for Healthcare Providers:  SeriousBroker.it  This test is no t yet approved or cleared by the [place] FDA and  has been authorized for detection and/or diagnosis of SARS-CoV-2 by FDA under an Emergency Use Authorization (EUA). This EUA will remain  in effect (meaning this test can be used) for the duration of the COVID-19 declaration under Section 564(b)(1) of the Act, 21 U.S.C.section 360bbb-3(b)(1), unless the authorization is terminated   or revoked sooner.       Influenza A by PCR NEGATIVE NEGATIVE Final   Influenza B by PCR NEGATIVE NEGATIVE Final    Comment: (NOTE) The Xpert Xpress SARS-CoV-2/FLU/RSV plus assay is intended as an aid in the diagnosis of influenza from Nasopharyngeal swab specimens and should not be used as a sole basis for treatment. Nasal washings and aspirates are unacceptable for Xpert Xpress SARS-CoV-2/FLU/RSV testing.  Fact Sheet for Patients: BloggerCourse.com  Fact Sheet for Healthcare Providers: SeriousBroker.it  This test is not yet approved or cleared by the [place] FDA and has been authorized for detection and/or diagnosis of SARS-CoV-2 by FDA under an Emergency Use Authorization (EUA). This EUA will remain in effect (meaning this test can be used) for the duration of the COVID-19 declaration under Section 564(b)(1) of the Act, 21 U.S.C. section 360bbb-3(b)(1), unless the authorization is terminated or revoked.  Performed at [facility], [address], [place], [place] [address]          Radiology Studies: No results found.      Scheduled Meds: . atorvastatin  40 mg Oral QHS  . diltiazem  240 mg Oral Daily  . docusate sodium  100 mg Oral BID  . doxazosin  2 mg Oral Daily  . ferrous sulfate  325 mg Oral Daily  . folic acid  1 mg Oral Daily  . hydroxypropyl methylcellulose / hypromellose  1 drop Both Eyes Daily  . insulin aspart  0-15 Units Subcutaneous TID WC  . labetalol  400 mg Oral q12n4p  . magnesium oxide  400 mg Oral Daily  . multivitamin with minerals  1 tablet Oral Daily  . pantoprazole  40 mg Oral Daily  . potassium chloride  SA  60 mEq Oral Daily  . rivaroxaban  20 mg Oral Q supper  . thiamine  100 mg Oral Daily   Or  . thiamine  100 mg Intravenous Daily   Continuous Infusions:    LOS: 3 days    Time spent:40 min    WOODS, Roselind Messier, MD [facility] Pager [phone]   If 7PM-7AM, please contact night-coverage [email or web address] Password [email or web address] 08/22/2020, 2:42 PM

## 2020-08-22 NOTE — Discharge Instructions (Signed)
Blood Glucose Monitoring, Adult Monitoring your blood sugar (glucose) is an important part of managing your diabetes (diabetes mellitus). Blood glucose monitoring involves checking your blood glucose as often as directed and keeping a record (log) of your results over time. Checking your blood glucose regularly and keeping a blood glucose log can:  Help you and your health care provider adjust your diabetes management plan as needed, including your medicines or insulin.  Help you understand how food, exercise, illnesses, and medicines affect your blood glucose.  Let you know what your blood glucose is at any time. You can quickly find out if you have low blood glucose (hypoglycemia) or high blood glucose (hyperglycemia). Your health care provider will set individualized treatment goals for you. Your goals will be based on your age, other medical conditions you have, and how you respond to diabetes treatment. Generally, the goal of treatment is to maintain the following blood glucose levels:  Before meals (preprandial): 80-130 mg/dL (4.4-7.2 mmol/L).  After meals (postprandial): below 180 mg/dL (10 mmol/L).  A1c level: less than 7%. Supplies needed:  Blood glucose meter.  Test strips for your meter. Each meter has its own strips. You must use the strips that came with your meter.  A needle to prick your finger (lancet). Do not use a lancet more than one time.  A device that holds the lancet (lancing device).  A journal or log book to write down your results. How to check your blood glucose  1. Wash your hands with soap and water. 2. Prick the side of your finger (not the tip) with the lancet. Use a different finger each time. 3. Gently rub the finger until a small drop of blood appears. 4. Follow instructions that come with your meter for inserting the test strip, applying blood to the strip, and using your blood glucose meter. 5. Write down your result and any notes. Some meters  allow you to use areas of your body other than your finger (alternative sites) to test your blood. The most common alternative sites are:  Forearm.  Thigh.  Palm of the hand. If you think you may have hypoglycemia, or if you have a history of not knowing when your blood glucose is getting low (hypoglycemia unawareness), do not use alternative sites. Use your finger instead. Alternative sites may not be as accurate as the fingers, because blood flow is slower in these areas. This means that the result you get may be delayed, and it may be different from the result that you would get from your finger. Follow these instructions at home: Blood glucose log   Every time you check your blood glucose, write down your result. Also write down any notes about things that may be affecting your blood glucose, such as your diet and exercise for the day. This information can help you and your health care provider: ? Look for patterns in your blood glucose over time. ? Adjust your diabetes management plan as needed.  Check if your meter allows you to download your records to a computer. Most glucose meters store a record of glucose readings in the meter. If you have type 1 diabetes:  Check your blood glucose 2 or more times a day.  Also check your blood glucose: ? Before every insulin injection. ? Before and after exercise. ? Before meals. ? 2 hours after a meal. ? Occasionally between 2:00 a.m. and 3:00 a.m., as directed. ? Before potentially dangerous tasks, like driving or using heavy machinery. ?   At bedtime.  You may need to check your blood glucose more often, up to 6-10 times a day, if you: ? Use an insulin pump. ? Need multiple daily injections (MDI). ? Have diabetes that is not well-controlled. ? Are ill. ? Have a history of severe hypoglycemia. ? Have hypoglycemia unawareness. If you have type 2 diabetes:  If you take insulin or other diabetes medicines, check your blood glucose 2 or  more times a day.  If you are on intensive insulin therapy, check your blood glucose 4 or more times a day. Occasionally, you may also need to check between 2:00 a.m. and 3:00 a.m., as directed.  Also check your blood glucose: ? Before and after exercise. ? Before potentially dangerous tasks, like driving or using heavy machinery.  You may need to check your blood glucose more often if: ? Your medicine is being adjusted. ? Your diabetes is not well-controlled. ? You are ill. General tips  Always keep your supplies with you.  If you have questions or need help, all blood glucose meters have a 24-hour "hotline" phone number that you can call. You may also contact your health care provider.  After you use a few boxes of test strips, adjust (calibrate) your blood glucose meter by following instructions that came with your meter. Contact a health care provider if:  Your blood glucose is at or above 240 mg/dL (13.3 mmol/L) for 2 days in a row.  You have been sick or have had a fever for 2 days or longer, and you are not getting better.  You have any of the following problems for more than 6 hours: ? You cannot eat or drink. ? You have nausea or vomiting. ? You have diarrhea. Get help right away if:  Your blood glucose is lower than 54 mg/dL (3 mmol/L).  You become confused or you have trouble thinking clearly.  You have difficulty breathing.  You have moderate or large ketone levels in your urine. Summary  Monitoring your blood sugar (glucose) is an important part of managing your diabetes (diabetes mellitus).  Blood glucose monitoring involves checking your blood glucose as often as directed and keeping a record (log) of your results over time.  Your health care provider will set individualized treatment goals for you. Your goals will be based on your age, other medical conditions you have, and how you respond to diabetes treatment.  Every time you check your blood glucose,  write down your result. Also write down any notes about things that may be affecting your blood glucose, such as your diet and exercise for the day. This information is not intended to replace advice given to you by your health care provider. Make sure you discuss any questions you have with your health care provider. Document Revised: 06/29/2018 Document Reviewed: 02/15/2016 Elsevier Patient Education  [place]. Hyperglycemia Hyperglycemia occurs when the level of sugar (glucose) in the blood is too high. Glucose is a type of sugar that provides the body's main source of energy. Certain hormones (insulin and glucagon) control the level of glucose in the blood. Insulin lowers blood glucose, and glucagon increases blood glucose. Hyperglycemia can result from having too little insulin in the bloodstream, or from the body not responding normally to insulin. Hyperglycemia occurs most often in people who have diabetes (diabetes mellitus), but it can happen in people who do not have diabetes. It can develop quickly, and it can be life-threatening if it causes you to become severely  dehydrated (diabetic ketoacidosis or hyperglycemic hyperosmolar state). Severe hyperglycemia is a medical emergency. What are the causes? If you have diabetes, hyperglycemia may be caused by:  Diabetes medicine.  Medicines that increase blood glucose or affect your diabetes control.  Not eating enough, or not eating often enough.  Changes in physical activity level.  Being sick or having an infection. If you have prediabetes or undiagnosed diabetes:  Hyperglycemia may be caused by those conditions. If you do not have diabetes, hyperglycemia may be caused by:  Certain medicines, including steroid medicines, beta-blockers, epinephrine, and thiazide diuretics.  Stress.  Serious illness.  Surgery.  Diseases of the pancreas.  Infection. What increases the risk? Hyperglycemia is more likely to develop in  people who have risk factors for diabetes, such as:  Having a family member with diabetes.  Having a gene for type 1 diabetes that is passed from parent to child (inherited).  Living in an area with cold weather conditions.  Exposure to certain viruses.  Certain conditions in which the body's disease-fighting (immune) system attacks itself (autoimmune disorders).  Being overweight or obese.  Having an inactive (sedentary) lifestyle.  Having been diagnosed with insulin resistance.  Having a history of prediabetes, gestational diabetes, or polycystic ovarian syndrome (PCOS).  Being of American-Indian, African-American, Hispanic/Latino, or Asian/Pacific Islander descent. What are the signs or symptoms? Hyperglycemia may not cause any symptoms. If you do have symptoms, they may include early warning signs, such as:  Increased thirst.  Hunger.  Feeling very tired.  Needing to urinate more often than usual.  Blurry vision. Other symptoms may develop if hyperglycemia gets worse, such as:  Dry mouth.  Loss of appetite.  Fruity-smelling breath.  Weakness.  Unexpected or rapid weight gain or weight loss.  Tingling or numbness in the hands or feet.  Headache.  Skin that does not quickly return to normal after being lightly pinched and released (poor skin turgor).  Abdominal pain.  Cuts or bruises that are slow to heal. How is this diagnosed? Hyperglycemia is diagnosed with a blood test to measure your blood glucose level. This blood test is usually done while you are having symptoms. Your health care provider may also do a physical exam and review your medical history. You may have more tests to determine the cause of your hyperglycemia, such as:  A fasting blood glucose (FBG) test. You will not be allowed to eat (you will fast) for at least 8 hours before a blood sample is taken.  An A1c (hemoglobin A1c) blood test. This provides information about blood glucose  control over the previous 2-3 months.  An oral glucose tolerance test (OGTT). This measures your blood glucose at two times: ? After fasting. This is your baseline blood glucose level. ? Two hours after drinking a beverage that contains glucose. How is this treated? Treatment depends on the cause of your hyperglycemia. Treatment may include:  Taking medicine to regulate your blood glucose levels. If you take insulin or other diabetes medicines, your medicine or dosage may be adjusted.  Lifestyle changes, such as exercising more, eating healthier foods, or losing weight.  Treating an illness or infection, if this caused your hyperglycemia.  Checking your blood glucose more often.  Stopping or reducing steroid medicines, if these caused your hyperglycemia. If your hyperglycemia becomes severe and it results in hyperglycemic hyperosmolar state, you must be hospitalized and given IV fluids. Follow these instructions at home:  General instructions  Take over-the-counter and prescription medicines only as  told by your health care provider.  Do not use any products that contain nicotine or tobacco, such as cigarettes and e-cigarettes. If you need help quitting, ask your health care provider.  Limit alcohol intake to no more than 1 drink per day for nonpregnant women and 2 drinks per day for men. One drink equals 12 oz of beer, 5 oz of wine, or 1 oz of hard liquor.  Learn to manage stress. If you need help with this, ask your health care provider.  Keep all follow-up visits as told by your health care provider. This is important. Eating and drinking   Maintain a healthy weight.  Exercise regularly, as directed by your health care provider.  Stay hydrated, especially when you exercise, get sick, or spend time in hot temperatures.  Eat healthy foods, such as: ? Lean proteins. ? Complex carbohydrates. ? Fresh fruits and vegetables. ? Low-fat dairy products. ? Healthy fats.  Drink  enough fluid to keep your urine clear or pale yellow. If you have diabetes:  Make sure you know the symptoms of hyperglycemia.  Follow your diabetes management plan, as told by your health care provider. Make sure you: ? Take your insulin and medicines as directed. ? Follow your exercise plan. ? Follow your meal plan. Eat on time, and do not skip meals. ? Check your blood glucose as often as directed. Make sure to check your blood glucose before and after exercise. If you exercise longer or in a different way than usual, check your blood glucose more often. ? Follow your sick day plan whenever you cannot eat or drink normally. Make this plan in advance with your health care provider.  Share your diabetes management plan with people in your workplace, school, and household.  Check your urine for ketones when you are ill and as told by your health care provider.  Carry a medical alert card or wear medical alert jewelry. Contact a health care provider if:  Your blood glucose is at or above 240 mg/dL (16.1 mmol/L) for 2 days in a row.  You have problems keeping your blood glucose in your target range.  You have frequent episodes of hyperglycemia. Get help right away if:  You have difficulty breathing.  You have a change in how you think, feel, or act (mental status).  You have nausea or vomiting that does not go away. These symptoms may represent a serious problem that is an emergency. Do not wait to see if the symptoms will go away. Get medical help right away. Call your local emergency services (911 in the [place] Do not drive yourself to the hospital. Summary  Hyperglycemia occurs when the level of sugar (glucose) in the blood is too high.  Hyperglycemia is diagnosed with a blood test to measure your blood glucose level. This blood test is usually done while you are having symptoms. Your health care provider may also do a physical exam and review your medical history.  If you have  diabetes, follow your diabetes management plan as told by your health care provider.  Contact your health care provider if you have problems keeping your blood glucose in your target range. This information is not intended to replace advice given to you by your health care provider. Make sure you discuss any questions you have with your health care provider. Document Revised: 05/23/2016 Document Reviewed: 05/23/2016 Elsevier Patient Education  2020 [employer] Heart Healthy, Consistent Carbohydrate Nutrition Therapy   A heart-healthy and consistent carbohydrate diet  is recommended to manage heart disease and diabetes. To follow a heart-healthy and consistent carbohydrate diet, . Eat a balanced diet with whole grains, fruits and vegetables, and lean protein sources.  . Choose heart-healthy unsaturated fats. Limit saturated fats, trans fats, and cholesterol intake. Eat more plant-based or vegetarian meals using beans and soy foods for protein.  . Eat whole, unprocessed foods to limit the amount of sodium (salt) you eat.  . Choose a consistent amount of carbohydrate at each meal and snack. Limit refined carbohydrates especially sugar, sweets and sugar-sweetened beverages.  . If you drink alcohol, do so in moderation: one serving per day (women) and two servings per day (men). o One serving is equivalent to 12 ounces beer, 5 ounces wine, or 1.5 ounces distilled spirits  Tips Tips for Choosing Heart-Healthy Fats Choose lean protein and low-fat dairy foods to reduce saturated fat intake. . Saturated fat is usually found in animal-based protein and is associated with certain health risks. Saturated fat is the biggest contributor to raise low-density lipoprotein (LDL) cholesterol levels. Research shows that limiting saturated fat lowers unhealthy cholesterol levels. Eat no more than 7% of your total calories each day from saturated fat. Ask your RDN to help you determine how much saturated fat is  right for you. . There are many foods that do not contain large amounts of saturated fats. Swapping these foods to replace foods high in saturated fats will help you limit the saturated fat you eat and improve your cholesterol levels. You can also try eating more plant-based or vegetarian meals. Instead of. Try:  Whole milk, cheese, yogurt, and ice cream 1% or skim milk, low-fat cheese, non-fat yogurt, and low-fat ice cream  Fatty, marbled beef and pork Lean beef, pork, or venison  Poultry with skin Poultry without skin  Butter, stick margarine Reduced-fat, whipped, or liquid spreads  Coconut oil, palm oil Liquid vegetable oils: corn, canola, olive, soybean and safflower oils   Avoid foods that contain trans fats. . Trans fats increase levels of LDL-cholesterol. Hydrogenated fat in processed foods is the main source of trans fats in foods.  . Trans fats can be found in stick margarine, shortening, processed sweets, baked goods, some fried foods, and packaged foods made with hydrogenated oils. Avoid foods with "partially hydrogenated oil" on the ingredient list such as: cookies, pastries, baked goods, biscuits, crackers, microwave popcorn, and frozen dinners. Choose foods with heart healthy fats. . Polyunsaturated and monounsaturated fat are unsaturated fats that may help lower your blood cholesterol level when used in place of saturated fat in your diet. . Ask your RDN about taking a dietary supplement with plant sterols and stanols to help lower your cholesterol level. Marland Kitchen Research shows that substituting saturated fats with unsaturated fats is beneficial to cholesterol levels. Try these easy swaps: Instead of. Try:  Butter, stick margarine, or solid shortening Reduced-fat, whipped, or liquid spreads  Beef, pork, or poultry with skin Fish and seafood  Chips, crackers, snack foods Raw or unsalted nuts and seeds or nut butters Hummus with vegetables Avocado on toast  Coconut oil, palm oil Liquid  vegetable oils: corn, canola, olive, soybean and safflower oils  Limit the amount of cholesterol you eat to less than 200 milligrams per day. . Cholesterol is a substance carried through the bloodstream via lipoproteins, which are known as "transporters" of fat. Some body functions need cholesterol to work properly, but too much cholesterol in the bloodstream can damage arteries and build up blood vessel linings (  which can lead to heart attack and stroke). You should eat less than 200 milligrams cholesterol per day. Marland Kitchen People respond differently to eating cholesterol. There is no test available right now that can figure out which people will respond more to dietary cholesterol and which will respond less. For individuals with high intake of dietary cholesterol, different types of increase (none, small, moderate, large) in LDL-cholesterol levels are all possible.  . Food sources of cholesterol include egg yolks and organ meats such as liver, gizzards. Limit egg yolks to two to four per week and avoid organ meats like liver and gizzards to control cholesterol intake. Tips for Choosing Heart-Healthy Carbohydrates Consume a consistent amount of carbohydrate . It is important to eat foods with carbohydrates in moderation because they impact your blood glucose level. Carbohydrates can be found in many foods such as: . Grains (breads, crackers, rice, pasta, and cereals)  . Starchy Vegetables (potatoes, corn, and peas)  . Beans and legumes  . Milk, soy milk, and yogurt  . Fruit and fruit juice  . Sweets (cakes, cookies, ice cream, jam and jelly) . Your RDN will help you set a goal for how many carbohydrate servings to eat at your meals and snacks. For many adults, eating 3 to 5 servings of carbohydrate foods at each meal and 1 or 2 carbohydrate servings for each snack works well.  . Check your blood glucose level regularly. It can tell you if you need to adjust when you eat carbohydrates. . Choose foods rich  in viscous (soluble) fiber . Viscous, or soluble, is found in the walls of plant cells. Viscous fiber is found only in plant-based foods. Eating foods with fiber helps to lower your unhealthy cholesterol and keep your blood glucose in range  . Rich sources of viscous fiber include vegetables (asparagus, Brussels sprouts, sweet potatoes, turnips) fruit (apricots, mangoes, oranges), legumes, and whole grains (barley, oats, and oat bran).  . As you increase your fiber intake gradually, also increase the amount of water you drink. This will help prevent constipation.  . If you have difficulty achieving this goal, ask your RDN about fiber laxatives. Choose fiber supplements made with viscous fibers such as psyllium seed husks or methylcellulose to help lower unhealthy cholesterol.  . Limit refined carbohydrates  . There are three types of carbohydrates: starches, sugar, and fiber. Some carbohydrates occur naturally in food, like the starches in rice or corn or the sugars in fruits and milk. Refined carbohydrates--foods with high amounts of simple sugars--can raise triglyceride levels. High triglyceride levels are associated with coronary heart disease. . Some examples of refined carbohydrate foods are table sugar, sweets, and beverages sweetened with added sugar. Tips for Reducing Sodium (Salt) Although sodium is important for your body to function, too much sodium can be harmful for people with high blood pressure. As sodium and fluid buildup in your tissues and bloodstream, your blood pressure increases. High blood pressure may cause damage to other organs and increase your risk for a stroke. Even if you take a pill for blood pressure or a water pill (diuretic) to remove fluid, it is still important to have less salt in your diet. Ask your doctor and RDN what amount of sodium is right for you. Marland Kitchen Avoid processed foods. Eat more fresh foods.  . Fresh fruits and vegetables are naturally low in sodium, as well  as frozen vegetables and fruits that have no added juices or sauces.  . Fresh meats are lower in sodium  than processed meats, such as bacon, sausage, and hotdogs. Read the nutrition label or ask your butcher to help you find a fresh meat that is low in sodium. . Eat less salt--at the table and when cooking.  . A single teaspoon of table salt has 2,300 mg of sodium.  . Leave the salt out of recipes for pasta, casseroles, and soups.  . Ask your RDN how to cook your favorite recipes without sodium . Be a Engineer, building services.  . Look for food packages that say "salt-free" or "sodium-free." These items contain less than 5 milligrams of sodium per serving.  Marland Kitchen "Very low-sodium" products contain less than 35 milligrams of sodium per serving.  Marland Kitchen "Low-sodium" products contain less than 140 milligrams of sodium per serving.  . Beware for "Unsalted" or "No Added Salt" products. These items may still be high in sodium. Check the nutrition label. . Add flavors to your food without adding sodium.  . Try lemon juice, lime juice, fruit juice or vinegar.  . Dry or fresh herbs add flavor. Try basil, bay leaf, dill, rosemary, parsley, sage, dry mustard, nutmeg, thyme, and paprika.  . Pepper, red pepper flakes, and cayenne pepper can add spice t your meals without adding sodium. Hot sauce contains sodium, but if you use just a drop or two, it will not add up to much.  Arnoldo Morale a sodium-free seasoning blend or make your own at home. Additional Lifestyle Tips Achieve and maintain a healthy weight. . Talk with your RDN or your doctor about what is a healthy weight for you. . Set goals to reach and maintain that weight.  . To lose weight, reduce your calorie intake along with increasing your physical activity. A weight loss of 10 to 15 pounds could reduce LDL-cholesterol by 5 milligrams per deciliter. Participate in physical activity. . Talk with your health care team to find out what types of physical activity are best for  you. Set a plan to get about 30 minutes of exercise on most days.  Foods Recommended Food Group Foods Recommended  Grains Whole grain breads and cereals, including whole wheat, barley, rye, buckwheat, corn, teff, quinoa, millet, amaranth, brown or wild rice, sorghum, and oats Pasta, especially whole wheat or other whole grain types  [employer], quinoa or wild rice Whole grain crackers, bread, rolls, pitas Home-made bread with reduced-sodium baking soda  Protein Foods Lean cuts of beef and pork (loin, leg, round, extra lean hamburger)  Skinless [employer] photographer and other wild game Dried beans and peas Nuts and nut butters Meat alternatives made with soy or textured vegetable protein  Egg whites or egg substitute Cold cuts made with lean meat or soy protein  Dairy Nonfat (skim), low-fat, or 1%-fat milk  Nonfat or low-fat yogurt or cottage cheese Fat-free and low-fat cheese  Vegetables Fresh, frozen, or canned vegetables without added fat or salt   Fruits Fresh, frozen, canned, or dried fruit   Oils Unsaturated oils (corn, olive, peanut, soy, sunflower, canola)  Soft or liquid margarines and vegetable oil spreads  Salad dressings Seeds and nuts  Avocado   Foods Not Recommended Food Group Foods Not Recommended  Grains Breads or crackers topped with salt Cereals (hot or cold) with more than 300 mg sodium per serving Biscuits, cornbread, and other "quick" breads prepared with baking soda Bread crumbs or stuffing mix from a store High-fat bakery products, such as doughnuts, biscuits, croissants, danish pastries, pies, cookies Instant cooking foods to which you add  hot water and stir--potatoes, noodles, rice, etc. Packaged starchy foods--seasoned noodle or rice dishes, stuffing mix, macaroni and cheese dinner Snacks made with partially hydrogenated oils, including chips, cheese puffs, snack mixes, regular crackers, butter-flavored popcorn  Protein Foods Higher-fat cuts of meats  (ribs, t-bone steak, regular hamburger) Bacon, sausage, or hot dogs Cold cuts, such as salami or bologna, deli meats, cured meats, corned beef Organ meats (liver, brains, gizzards, sweetbreads) Poultry with skin Fried or smoked meat, poultry, and fish Whole eggs and egg yolks (more than 2-4 per week) Salted legumes, nuts, seeds, or nut/seed butters Meat alternatives with high levels of sodium (>300 mg per serving) or saturated fat (>5 g per serving)  Dairy Whole milk,?2% fat milk, buttermilk Whole milk yogurt or ice cream Cream Half-&-half Cream cheese Sour cream Cheese  Vegetables Canned or frozen vegetables with salt, fresh vegetables prepared with salt, butter, cheese, or cream sauce Fried vegetables Pickled vegetables such as olives, pickles, or sauerkraut  Fruits Fried fruits Fruits served with butter or cream  Oils Butter, stick margarine, shortening Partially hydrogenated oils or trans fats Tropical oils (coconut, palm, palm kernel oils)  Other Candy, sugar sweetened soft drinks and desserts Salt, sea salt, garlic salt, and seasoning mixes containing salt Bouillon cubes Ketchup, barbecue sauce, Worcestershire sauce, soy sauce, teriyaki sauce Miso Salsa Pickles, olives, relish   Heart Healthy Consistent Carbohydrate Vegetarian (Lacto-Ovo) Sample 1-Day Menu  Breakfast 1 cup oatmeal, cooked (2 carbohydrate servings)   cup blueberries (1 carbohydrate serving)  11 almonds, without salt  1 cup 1% milk (1 carbohydrate serving)  1 cup coffee  Morning Snack 1 cup fat-free plain yogurt (1 carbohydrate serving)  Lunch 1 whole wheat bun (1 carbohydrate servings)  1 black bean burger (1 carbohydrate servings)  1 slice cheddar cheese, low sodium  2 slices tomatoes  2 leaves lettuce  1 teaspoon mustard  1 small pear (1 carbohydrate servings)  1 cup green tea, unsweetened  Afternoon Snack 1/3 cup trail mix with nuts, seeds, and raisins, without salt (1 carbohydrate servinga)   Evening Meal  cup meatless chicken  2/3 cup brown rice, cooked (2 carbohydrate servings)  1 cup broccoli, cooked (2/3 carbohydrate serving)   cup carrots, cooked (1/3 carbohydrate serving)  2 teaspoons olive oil  1 teaspoon balsamic vinegar  1 whole wheat dinner roll (1 carbohydrate serving)  1 teaspoon margarine, soft, tub  1 cup 1% milk (1 carbohydrate serving)  Evening Snack 1 extra small banana (1 carbohydrate serving)  1 tablespoon peanut butter   Heart Healthy Consistent Carbohydrate Vegan Sample 1-Day Menu  Breakfast 1 cup oatmeal, cooked (2 carbohydrate servings)   cup blueberries (1 carbohydrate serving)  11 almonds, without salt  1 cup soymilk fortified with calcium, vitamin B12, and vitamin D  1 cup coffee  Morning Snack 6 ounces soy yogurt (1 carbohydrate servings)  Lunch 1 whole wheat bun(1 carbohydrate servings)  1 black bean burger (1 carbohydrate serving)  2 slices tomatoes  2 leaves lettuce  1 teaspoon mustard  1 small pear (1 carbohydrate servings)  1 cup green tea, unsweetened  Afternoon Snack 1/3 cup trail mix with nuts, seeds, and raisins, without salt (1 carbohydrate servings)  Evening Meal  cup meatless chicken  2/3 cup brown rice, cooked (2 carbohydrate servings)  1 cup broccoli, cooked (2/3 carbohydrate serving)   cup carrots, cooked (1/3 carbohydrate serving)  2 teaspoons olive oil  1 teaspoon balsamic vinegar  1 whole wheat dinner roll (1 carbohydrate serving)  1  teaspoon margarine, soft, tub  1 cup soymilk fortified with calcium, vitamin B12, and vitamin D  Evening Snack 1 extra small banana (1 carbohydrate serving)  1 tablespoon peanut butter    Heart Healthy Consistent Carbohydrate Sample 1-Day Menu  Breakfast 1 cup cooked oatmeal (2 carbohydrate servings)  3/4 cup blueberries (1 carbohydrate serving)  1 ounce almonds  1 cup skim milk (1 carbohydrate serving)  1 cup coffee  Morning Snack 1 cup sugar-free nonfat yogurt (1  carbohydrate serving)  Lunch 2 slices whole-wheat bread (2 carbohydrate servings)  2 ounces lean [place] breast  1 ounce low-fat Swiss cheese  1 teaspoon mustard  1 slice tomato  1 lettuce leaf  1 small pear (1 carbohydrate serving)  1 cup skim milk (1 carbohydrate serving)  Afternoon Snack 1 ounce trail mix with unsalted nuts, seeds, and raisins (1 carbohydrate serving)  Evening Meal 3 ounces salmon  2/3 cup cooked brown rice (2 carbohydrate servings)  1 teaspoon soft margarine  1 cup cooked broccoli with 1/2 cup cooked carrots (1 carbohydrate serving  Carrots, cooked, boiled, drained, without salt  1 cup lettuce  1 teaspoon olive oil with vinegar for dressing  1 small whole grain roll (1 carbohydrate serving)  1 teaspoon soft margarine  1 cup unsweetened tea  Evening Snack 1 extra-small banana (1 carbohydrate serving)  Copyright 2020  [employer]. All rights reserved.    Information on my medicine - XARELTO (Rivaroxaban)  Why was Xarelto prescribed for you? Xarelto was prescribed for you to reduce the risk of a blood clot forming that can cause a stroke if you have a medical condition called atrial fibrillation (a type of irregular heartbeat).  What do you need to know about xarelto ? Take your Xarelto ONCE DAILY at the same time every day with your evening meal. If you have difficulty swallowing the tablet whole, you may crush it and mix in applesauce just prior to taking your dose.  Take Xarelto exactly as prescribed by your doctor and DO NOT stop taking Xarelto without talking to the doctor who prescribed the medication.  Stopping without other stroke prevention medication to take the place of Xarelto may increase your risk of developing a clot that causes a stroke.  Refill your prescription before you run out.  After discharge, you should have regular check-up appointments with your healthcare provider that is prescribing your Xarelto.  In the  future your dose may need to be changed if your kidney function or weight changes by a significant amount.  What do you do if you miss a dose? If you are taking Xarelto ONCE DAILY and you miss a dose, take it as soon as you remember on the same day then continue your regularly scheduled once daily regimen the next day. Do not take two doses of Xarelto at the same time or on the same day.   Important Safety Information A possible side effect of Xarelto is bleeding. You should call your healthcare provider right away if you experience any of the following: ? Bleeding from an injury or your nose that does not stop. ? Unusual colored urine (red or dark brown) or unusual colored stools (red or black). ? Unusual bruising for unknown reasons. ? A serious fall or if you hit your head (even if there is no bleeding).  Some medicines may interact with Xarelto and might increase your risk of bleeding while on Xarelto. To help avoid this, consult your healthcare provider or  pharmacist prior to using any new prescription or non-prescription medications, including herbals, vitamins, non-steroidal anti-inflammatory drugs (NSAIDs) and supplements.  This website has more information on Xarelto: [email or web address].

## 2020-08-22 NOTE — Progress Notes (Addendum)
Cardiology Rounding Note  PCP-Cardiologist: Dietrich Pates, MD   Subjective:   Underwent TEE/DCCV yesterday.  TEE shows EF 60 to 65%, mildly decreased RV function, no LAA thrombus, no significant valvular disease.  Underwent successful cardioversion.  Renal function improving (creatinine 2.0 > 2.6 > 2.5 > 1.9).  Denies any chest pain or dyspnea.  Lab Results  Component Value Date   CREATININE 1.91 (H) 08/22/2020   CREATININE 2.52 (H) 08/21/2020   CREATININE 2.60 (H) 08/20/2020   Denies shortness of breath.   Objective:   Weight Range: (!) 161.1 kg Body mass index is 49.54 kg/m.   Vital Signs:   Temp:  [97.7 F (36.5 C)-98.3 F (36.8 C)] 97.8 F (36.6 C) (12/04 0552) Pulse Rate:  [78-118] 88 (12/04 0552) Resp:  [15-20] 19 (12/04 0552) BP: (98-164)/(62-100) 164/100 (12/04 0552) SpO2:  [94 %-99 %] 99 % (12/04 0552) Weight:  [160.1 kg-161.1 kg] 161.1 kg (12/04 0552)    Weight change: Filed Weights   08/21/20 0605 08/21/20 1502 08/22/20 0552  Weight: (!) 162.4 kg (!) 160.1 kg (!) 161.1 kg    Intake/Output:   Intake/Output Summary (Last 24 hours) at 08/22/2020 0737 Last data filed at 08/21/2020 1620 Gross per 24 hour  Intake 1000 ml  Output --  Net 1000 ml      Physical Exam    General:  Sitting on side of the bed.  No resp difficulty HEENT: normal Neck: supple. no JVD. Carotids 2+ bilat; no bruits. No lymphadenopathy or thryomegaly appreciated. Cor: RRR. No rubs, gallops or murmurs. Lungs: clear Abdomen: obese, soft, nontender, nondistended. No hepatosplenomegaly. No bruits or masses. Good bowel sounds. Extremities: no cyanosis, clubbing, rash, edema Neuro: alert & orientedx3,moves all 4 extremities w/o difficulty. Affect pleasant   Telemetry  NSR 80s.  NSVT x 5 beats  EKG    Not performed   Labs    CBC Recent Labs    08/21/20 0250 08/22/20 0206  WBC 9.2 7.5  NEUTROABS 5.1 3.8  HGB 14.7 13.4  HCT 46.7 45.0  MCV 74.8* 75.5*  PLT 396 382    Basic Metabolic Panel Recent Labs    65/46/50 0250 08/22/20 0206  NA 137 136  K 3.3* 3.5  CL 94* 93*  CO2 31 32  GLUCOSE 128* 116*  BUN 39* 43*  CREATININE 2.52* 1.91*  CALCIUM 9.0 9.0  MG 2.2 1.8  PHOS 4.9* 3.8   Liver Function Tests Recent Labs    08/21/20 0250 08/22/20 0206  AST 16 16  ALT 15 16  ALKPHOS 48 43  BILITOT 0.3 0.5  PROT 7.5 7.0  ALBUMIN 3.3* 3.2*   No results for input(s): LIPASE, AMYLASE in the last 72 hours. Cardiac Enzymes No results for input(s): CKTOTAL, CKMB, CKMBINDEX, TROPONINI in the last 72 hours.  BNP: BNP (last 3 results) Recent Labs    02/24/20 1501 08/19/20 1018  BNP 75.1 40.6    ProBNP (last 3 results) No results for input(s): PROBNP in the last 8760 hours.   D-Dimer No results for input(s): DDIMER in the last 72 hours. Hemoglobin A1C Recent Labs    08/19/20 1617  HGBA1C 6.1*   Fasting Lipid Panel No results for input(s): CHOL, HDL, LDLCALC, TRIG, CHOLHDL, LDLDIRECT in the last 72 hours. Thyroid Function Tests No results for input(s): TSH, T4TOTAL, T3FREE, THYROIDAB in the last 72 hours.  Invalid input(s): FREET3  Other results:   Imaging    No results found.   Medications:  Scheduled Medications: . atorvastatin  40 mg Oral QHS  . diltiazem  240 mg Oral Daily  . docusate sodium  100 mg Oral BID  . doxazosin  2 mg Oral Daily  . ferrous sulfate  325 mg Oral Daily  . folic acid  1 mg Oral Daily  . hydroxypropyl methylcellulose / hypromellose  1 drop Both Eyes Daily  . insulin aspart  0-15 Units Subcutaneous TID WC  . labetalol  400 mg Oral q12n4p  . magnesium oxide  400 mg Oral Daily  . multivitamin with minerals  1 tablet Oral Daily  . pantoprazole  40 mg Oral Daily  . potassium chloride SA  60 mEq Oral Daily  . rivaroxaban  20 mg Oral Q supper  . thiamine  100 mg Oral Daily   Or  . thiamine  100 mg Intravenous Daily    Infusions:   PRN Medications: acetaminophen, albuterol, LORazepam  **OR** LORazepam, methocarbamol, ondansetron (ZOFRAN) IV   Assessment/Plan   1. Chronic diastolic CHF: Echo in 10/20 with EF 65-70%.  TEE 12/3 showed LVEF 60 to 65%, mild RV dysfunction - he has had multiple admissions in the past for a/c HF in the setting of poor compliance w/ meds and dietary indiscretion w/ sodium. Now being admitted for a/c CHF in the setting of rapid afib after drug binge w/ ETOH and cocaine.  - Renal function remains elevated. Off diuretics.  Holding Lasix and losartan 2. Paroxsymal Atrial fibrillation:  - presented w afib w/ RVR after drug binge w/ ETOH and cocaine.  -Converted to sinus rhythm following TEE/DCCV on 12/30 -Continue cardizem 240 mg daily -Continue  Xarelto 240 mg daily.  3. H/o PE: 5/20, continue Xarelto.  4. OHS/OSA:CPAP QHS  5. HTN:  Stable.  6.  Polysubstance Use -  UDS + for cocaine and THC  8. Situational Depression  - management per IM  9. Hypokalemia/ Hypomagnesemia  -Supp K  Mag ok.   10. AKI - SCr 1.59 on admit, bumped to 2.5. Holding diuretics.  Improved 1.9 today 11. Chest Pain w/ mildly elevated Hs trop - hs trop 38>>86>>208>>288>>339 - 2D echo shows normal EF and no RWMAs - likely demand ischemia from rapid afib/ fluid overload and cocaine use however given trend, 2 day nuclear stress test planned.  First part today 12. DM - on insulin. Management per IM   Length of Stay: 3  Little Ishikawa, MD  08/22/2020, 7:37 AM

## 2020-08-23 ENCOUNTER — Encounter (HOSPITAL_COMMUNITY): Payer: Self-pay | Admitting: Cardiology

## 2020-08-23 ENCOUNTER — Inpatient Hospital Stay (HOSPITAL_COMMUNITY): Payer: Medicaid Other

## 2020-08-23 DIAGNOSIS — I5032 Chronic diastolic (congestive) heart failure: Secondary | ICD-10-CM | POA: Diagnosis not present

## 2020-08-23 DIAGNOSIS — I4891 Unspecified atrial fibrillation: Secondary | ICD-10-CM | POA: Diagnosis not present

## 2020-08-23 DIAGNOSIS — I5033 Acute on chronic diastolic (congestive) heart failure: Secondary | ICD-10-CM | POA: Diagnosis not present

## 2020-08-23 DIAGNOSIS — F1010 Alcohol abuse, uncomplicated: Secondary | ICD-10-CM | POA: Diagnosis not present

## 2020-08-23 DIAGNOSIS — I480 Paroxysmal atrial fibrillation: Secondary | ICD-10-CM

## 2020-08-23 DIAGNOSIS — R778 Other specified abnormalities of plasma proteins: Secondary | ICD-10-CM | POA: Diagnosis not present

## 2020-08-23 DIAGNOSIS — N179 Acute kidney failure, unspecified: Secondary | ICD-10-CM | POA: Diagnosis not present

## 2020-08-23 DIAGNOSIS — I10 Essential (primary) hypertension: Secondary | ICD-10-CM | POA: Diagnosis not present

## 2020-08-23 LAB — CBC WITH DIFFERENTIAL/PLATELET
Abs Immature Granulocytes: 0.01 10*3/uL (ref 0.00–0.07)
Basophils Absolute: 0 10*3/uL (ref 0.0–0.1)
Basophils Relative: 0 %
Eosinophils Absolute: 0.4 10*3/uL (ref 0.0–0.5)
Eosinophils Relative: 6 %
HCT: 42.1 % (ref 39.0–52.0)
Hemoglobin: 12.8 g/dL — ABNORMAL LOW (ref 13.0–17.0)
Immature Granulocytes: 0 %
Lymphocytes Relative: 28 %
Lymphs Abs: 2.1 10*3/uL (ref 0.7–4.0)
MCH: 22.9 pg — ABNORMAL LOW (ref 26.0–34.0)
MCHC: 30.4 g/dL (ref 30.0–36.0)
MCV: 75.2 fL — ABNORMAL LOW (ref 80.0–100.0)
Monocytes Absolute: 1.1 10*3/uL — ABNORMAL HIGH (ref 0.1–1.0)
Monocytes Relative: 15 %
Neutro Abs: 3.7 10*3/uL (ref 1.7–7.7)
Neutrophils Relative %: 51 %
Platelets: 336 10*3/uL (ref 150–400)
RBC: 5.6 MIL/uL (ref 4.22–5.81)
RDW: 19.7 % — ABNORMAL HIGH (ref 11.5–15.5)
WBC: 7.3 10*3/uL (ref 4.0–10.5)
nRBC: 0 % (ref 0.0–0.2)

## 2020-08-23 LAB — NM MYOCAR MULTI W/SPECT W/WALL MOTION / EF
Estimated workload: 1 METS
Exercise duration (min): 0 min
Exercise duration (sec): 0 s
MPHR: 166 {beats}/min
Peak HR: 90 {beats}/min
Percent HR: 54 %
Rest HR: 79 {beats}/min
TID: 0.95

## 2020-08-23 LAB — LIPID PANEL
Cholesterol: 107 mg/dL (ref 0–200)
HDL: 37 mg/dL — ABNORMAL LOW (ref >40)
LDL Cholesterol: 55 mg/dL (ref 0–99)
Total CHOL/HDL Ratio: 2.9 RATIO
Triglycerides: 77 mg/dL (ref <150)
VLDL: 15 mg/dL (ref 0–40)

## 2020-08-23 LAB — GLUCOSE, CAPILLARY
Glucose-Capillary: 108 mg/dL — ABNORMAL HIGH (ref 70–99)
Glucose-Capillary: 115 mg/dL — ABNORMAL HIGH (ref 70–99)

## 2020-08-23 LAB — COMPREHENSIVE METABOLIC PANEL
ALT: 15 U/L (ref 0–44)
AST: 19 U/L (ref 15–41)
Albumin: 3.3 g/dL — ABNORMAL LOW (ref 3.5–5.0)
Alkaline Phosphatase: 45 U/L (ref 38–126)
Anion gap: 8 (ref 5–15)
BUN: 31 mg/dL — ABNORMAL HIGH (ref 6–20)
CO2: 29 mmol/L (ref 22–32)
Calcium: 8.9 mg/dL (ref 8.9–10.3)
Chloride: 98 mmol/L (ref 98–111)
Creatinine, Ser: 1.29 mg/dL — ABNORMAL HIGH (ref 0.61–1.24)
GFR, Estimated: >60 mL/min (ref >60)
Glucose, Bld: 126 mg/dL — ABNORMAL HIGH (ref 70–99)
Potassium: 4 mmol/L (ref 3.5–5.1)
Sodium: 135 mmol/L (ref 135–145)
Total Bilirubin: 0.4 mg/dL (ref 0.3–1.2)
Total Protein: 6.9 g/dL (ref 6.5–8.1)

## 2020-08-23 LAB — MAGNESIUM: Magnesium: 1.8 mg/dL (ref 1.7–2.4)

## 2020-08-23 LAB — PHOSPHORUS: Phosphorus: 2.5 mg/dL (ref 2.5–4.6)

## 2020-08-23 MED ORDER — LOSARTAN POTASSIUM 50 MG PO TABS
100.0000 mg | ORAL_TABLET | Freq: Every day | ORAL | Status: DC
  Administered 2020-08-23 – 2020-08-24 (×2): 100 mg via ORAL
  Filled 2020-08-23 (×2): qty 2

## 2020-08-23 MED ORDER — HYDRALAZINE HCL 10 MG PO TABS
10.0000 mg | ORAL_TABLET | Freq: Three times a day (TID) | ORAL | Status: DC
  Administered 2020-08-23 – 2020-08-24 (×3): 10 mg via ORAL
  Filled 2020-08-23 (×3): qty 1

## 2020-08-23 MED ORDER — TECHNETIUM TC 99M TETROFOSMIN IV KIT
32.4000 | PACK | Freq: Once | INTRAVENOUS | Status: AC | PRN
  Administered 2020-08-23: 32.4 via INTRAVENOUS

## 2020-08-23 NOTE — Progress Notes (Signed)
Pt has home cpap and places self on/off as needed. RT will monitor °

## 2020-08-23 NOTE — Progress Notes (Addendum)
PROGRESS NOTE    Benjamin [name]  PZW:[id number] DOB: March 05, 1966 DOA: 08/19/2020 PCP: [name], NP     Brief Narrative:  54 y.o.BM PMHx Chronic Diastolic CHF, Chronic A. fib, HTN, chronic hypoxic respiratory failure on 3 L O2 at home, PE, CKD stage II, Chronic hypokalemia, Morbid obesity, OSA/OHS DM type II, substance abuse, EtOH abuse,  Presented with increasing short of breath and weakness.  Patient claimed that he was on a trip out of town over the weekend, and forgot to take all his CHF medications along with him, as a result, he has not been taking any of his medication for last 3 days. Now being admitted for a/c CHF in the setting of rapid afib after drug binge w/ ETOH and cocaine.  This morning, he started to feel extremely shortness of breath, heaviness in the chest, denied any cough, no no fever chills. ED Course: Patient was found to be in rapid A. fib, Cardizem drip started.  Chest x-ray showed chronic lung vasculature congestion.   Subjective: 12/5 afebrile overnight, A/O x4, negative CP, negative S OB.  Waiting for cardiology to read the second part of his Power stress test.   Assessment & Plan: Covid vaccination; vaccinated   Active Problems:   Morbid obesity with BMI of 50.0-59.9, adult (HCC)   Chronic diastolic CHF (congestive heart failure) (HCC)   PAF (paroxysmal atrial fibrillation) (HCC)   OSA on CPAP   Acute on chronic diastolic CHF (congestive heart failure) (HCC)   Hypertension   Diabetic neuropathy (HCC)   Obesity hypoventilation syndrome (HCC)   Atrial fibrillation with rapid ventricular response (HCC)   Polysubstance abuse ([place])   Alcohol abuse   A. fib with RVR -Multifactorial; noncompliance, EtOH abuse, cocaine abuse, OHS/OSA. -Rivaroxaban -Diltiazem 240 mg daily -Doxazosin 2 mg daily -12/5 Hydralazine p.o. 10 mg TID -Hydralazine PRN SBP> 140 or DBP>100 -Labetalol 400 mg BID -Spironolactone 50 mg daily -Hold  Torsemide -12/3 s/p DCCV -12/4 s/p first part of stress test completed today. -12/5 s/p second part of stress test completed today awaiting cardiology reading  Acute on Chronic Diastolic CHF -See A. fib RVR -Strict in and out +82.5ml -Daily weight Filed Weights   08/21/20 1502 08/22/20 0552 08/23/20 0352  Weight: (!) 160.1 kg (!) 161.1 kg (!) 162.3 kg    Primary HTN -See A. fib RVR  Acute on CKD stage II (baseline Cr~1.21) -Multifactorial prerenal secondary to cardiorenal syndrome, uncontrolled A. fib, continued diuresis, drug abuse. -Correct underlying causes Lab Results  Component Value Date   CREATININE 1.29 (H) 08/23/2020   CREATININE 1.91 (H) 08/22/2020   CREATININE 2.52 (H) 08/21/2020   CREATININE 2.60 (H) 08/20/2020   CREATININE 2.04 (H) 08/20/2020  -We will monitor for improvement with improvement in A. fib, clearance of toxins from system.  Hold diuretics -12/5 almost back to baseline  DM type II controlled with complication -42/3 hemoglobin A1c= 6.1 -Hold home medication -Moderate SSI -DM coordinator consult; patient with poor impulse control, and poor understanding of his disease process requires education -Nutrition consult;patient with poor impulse control, and poor understanding of his disease process requires nutritional education  HLD -12/5 LDL= 55  Polysubstance abuse/EtOH abuse -12/1 urine tox screen positive cocaine, marijuana -12/1 EtOH screen negative -CIWA protocol -LCSW consult substance abuse; met with patient and offered resources  Hypokalemia -Potassium goal> 4  OSA/OHS -CPAP per respiratory  Morbidly obese (BMI 47.9 kg/m) -Patient would benefit from bariatric surgery consult as outpatient    DVT  prophylaxis: Rivaroxaban Code Status: Full Family Communication:   Status is: Inpatient    Dispo: The patient is from: Home              Anticipated d/c is to: Home              Anticipated d/c date is: 12/6              Patient  currently unstable      Consultants:  Cardiology    Procedures/Significant Events:  12/2 echocardiogram;LVEF 60 to 65%.  -severe LVH  -diastolic function could not be evaluated due to atrial fibrillation.  12/3 DCCV 12/4 and 12/5 Lexiscan Myoview stress test performed    I have personally reviewed and interpreted all radiology studies and my findings are as above.      Continuous Infusions:    Objective: Vitals:   08/23/20 0905 08/23/20 1100 08/23/20 1315 08/23/20 1321  BP:  (!) 169/94 (!) 161/88   Pulse: 75 83 84 82  Resp: 20     Temp: (!) 97.3 F (36.3 C)     TempSrc: Oral     SpO2:    94%  Weight:      Height:        Intake/Output Summary (Last 24 hours) at 08/23/2020 1342 Last data filed at 08/23/2020 1316 Gross per 24 hour  Intake 715 ml  Output --  Net 715 ml   Filed Weights   08/21/20 1502 08/22/20 0552 08/23/20 0352  Weight: (!) 160.1 kg (!) 161.1 kg (!) 162.3 kg    Examination:  General: A/O x4, negative chronic respiratory distress Eyes: negative scleral hemorrhage, negative anisocoria, negative icterus ENT: Negative Runny nose, negative gingival bleeding, Neck:  Negative scars, masses, torticollis, lymphadenopathy, JVD Lungs: decreased breath sounds bilaterally without wheezes or crackles Cardiovascular: Regular rhythm and rate without murmur gallop or rub normal S1 and S2 Abdomen: MORBIDLY OBESE, negative abdominal pain, nondistended, positive soft, bowel sounds, no rebound, no ascites, no appreciable mass Extremities: No significant cyanosis, clubbing, or edema bilateral lower extremities Skin: Negative rashes, lesions, ulcers Psychiatric: Positive depression, negative anxiety, negative fatigue, negative mania  Central nervous system:  Cranial nerves II through XII intact, tongue/uvula midline, all extremities muscle strength 5/5, sensation intact throughout, negative dysarthria, negative expressive aphasia, negative receptive  aphasia.  .     Data Reviewed: Care during the described time interval was provided by me .  I have reviewed this patient's available data, including medical history, events of note, physical examination, and all test results as part of my evaluation.  CBC: Recent Labs  Lab 08/19/20 1018 08/19/20 1018 08/19/20 1120 08/20/20 2127 08/21/20 0250 08/22/20 0206 08/23/20 0334  WBC 5.9  --   --  10.0 9.2 7.5 7.3  NEUTROABS  --   --   --   --  5.1 3.8 3.7  HGB 14.7   < > 17.7* 14.0 14.7 13.4 12.8*  HCT 50.2   < > 52.0 47.2 46.7 45.0 42.1  MCV 76.2*  --   --  75.4* 74.8* 75.5* 75.2*  PLT 389  --   --  396 396 382 336   < > = values in this interval not displayed.   Basic Metabolic Panel: Recent Labs  Lab 08/19/20 1018 08/19/20 1617 08/20/20 0736 08/20/20 2127 08/21/20 0250 08/22/20 0206 08/23/20 0334  NA   < >  --  136 134* 137 136 135  K   < >  --  3.3* 3.1*  3.3* 3.5 4.0  CL   < >  --  90* 91* 94* 93* 98  CO2   < >  --  33* 31 31 32 29  GLUCOSE   < >  --  121* 126* 128* 116* 126*  BUN   < >  --  26* 39* 39* 43* 31*  CREATININE   < >  --  2.04* 2.60* 2.52* 1.91* 1.29*  CALCIUM   < >  --  8.8* 8.7* 9.0 9.0 8.9  MG  --  1.7  --  2.1 2.2 1.8 1.8  PHOS  --   --   --  5.2* 4.9* 3.8 2.5   < > = values in this interval not displayed.   GFR: Estimated Creatinine Clearance: 101.9 mL/min (A) (by C-G formula based on SCr of 1.29 mg/dL (H)). Liver Function Tests: Recent Labs  Lab 08/19/20 1018 08/20/20 2127 08/21/20 0250 08/22/20 0206 08/23/20 0334  AST 23 17 16 16 19   ALT 16 14 15 16 15   ALKPHOS 52 45 48 43 45  BILITOT 0.5 0.5 0.3 0.5 0.4  PROT 7.7 7.2 7.5 7.0 6.9  ALBUMIN 3.5 3.2* 3.3* 3.2* 3.3*   No results for input(s): LIPASE, AMYLASE in the last 168 hours. No results for input(s): AMMONIA in the last 168 hours. Coagulation Profile: No results for input(s): INR, PROTIME in the last 168 hours. Cardiac Enzymes: No results for input(s): CKTOTAL, CKMB, CKMBINDEX,  TROPONINI in the last 168 hours. BNP (last 3 results) No results for input(s): PROBNP in the last 8760 hours. HbA1C: No results for input(s): HGBA1C in the last 72 hours. CBG: Recent Labs  Lab 08/22/20 1225 08/22/20 1702 08/22/20 2121 08/23/20 0758 08/23/20 1217  GLUCAP 102* 95 127* 108* 115*   Lipid Profile: Recent Labs    08/23/20 0334  CHOL 107  HDL 37*  LDLCALC 55  TRIG 77  CHOLHDL 2.9   Thyroid Function Tests: No results for input(s): TSH, T4TOTAL, FREET4, T3FREE, THYROIDAB in the last 72 hours. Anemia Panel: No results for input(s): VITAMINB12, FOLATE, FERRITIN, TIBC, IRON, RETICCTPCT in the last 72 hours. Sepsis Labs: No results for input(s): PROCALCITON, LATICACIDVEN in the last 168 hours.  Recent Results (from the past 240 hour(s))  Resp Panel by RT-PCR (Flu A&B, Covid) Nasopharyngeal Swab     Status: None   Collection Time: 08/19/20  2:08 PM   Specimen: Nasopharyngeal Swab; Nasopharyngeal(NP) swabs in vial transport medium  Result Value Ref Range Status   SARS Coronavirus 2 by RT PCR NEGATIVE NEGATIVE Final    Comment: (NOTE) SARS-CoV-2 target nucleic acids are NOT DETECTED.  The SARS-CoV-2 RNA is generally detectable in upper respiratory specimens during the acute phase of infection. The lowest concentration of SARS-CoV-2 viral copies this assay can detect is 138 copies/mL. A negative result does not preclude SARS-Cov-2 infection and should not be used as the sole basis for treatment or other patient management decisions. A negative result may occur with  improper specimen collection/handling, submission of specimen other than nasopharyngeal swab, presence of viral mutation(s) within the areas targeted by this assay, and inadequate number of viral copies(<138 copies/mL). A negative result must be combined with clinical observations, patient history, and epidemiological information. The expected result is Negative.  Fact Sheet for Patients:   EntrepreneurPulse.com.au  Fact Sheet for Healthcare Providers:  IncredibleEmployment.be  This test is no t yet approved or cleared by the [place] FDA and  has been authorized for detection and/or diagnosis of  SARS-CoV-2 by FDA under an Emergency Use Authorization (EUA). This EUA will remain  in effect (meaning this test can be used) for the duration of the COVID-19 declaration under Section 564(b)(1) of the Act, 21 U.S.C.section 360bbb-3(b)(1), unless the authorization is terminated  or revoked sooner.       Influenza A by PCR NEGATIVE NEGATIVE Final   Influenza B by PCR NEGATIVE NEGATIVE Final    Comment: (NOTE) The Xpert Xpress SARS-CoV-2/FLU/RSV plus assay is intended as an aid in the diagnosis of influenza from Nasopharyngeal swab specimens and should not be used as a sole basis for treatment. Nasal washings and aspirates are unacceptable for Xpert Xpress SARS-CoV-2/FLU/RSV testing.  Fact Sheet for Patients: EntrepreneurPulse.com.au  Fact Sheet for Healthcare Providers: IncredibleEmployment.be  This test is not yet approved or cleared by the [place] FDA and has been authorized for detection and/or diagnosis of SARS-CoV-2 by FDA under an Emergency Use Authorization (EUA). This EUA will remain in effect (meaning this test can be used) for the duration of the COVID-19 declaration under Section 564(b)(1) of the Act, 21 U.S.C. section 360bbb-3(b)(1), unless the authorization is terminated or revoked.  Performed at [facility], [place] [address], [place], [place] [address]          Radiology Studies: No results found.      Scheduled Meds: . atorvastatin  40 mg Oral QHS  . diltiazem  240 mg Oral Daily  . docusate sodium  100 mg Oral BID  . doxazosin  2 mg Oral Daily  . ferrous sulfate  325 mg Oral Daily  . folic acid  1 mg Oral Daily  . hydroxypropyl methylcellulose /  hypromellose  1 drop Both Eyes Daily  . insulin aspart  0-15 Units Subcutaneous TID WC  . labetalol  400 mg Oral q12n4p  . losartan  100 mg Oral Daily  . magnesium oxide  400 mg Oral Daily  . multivitamin with minerals  1 tablet Oral Daily  . pantoprazole  40 mg Oral Daily  . potassium chloride SA  60 mEq Oral Daily  . rivaroxaban  20 mg Oral Q supper  . thiamine  100 mg Oral Daily   Or  . thiamine  100 mg Intravenous Daily   Continuous Infusions:    LOS: 4 days    Time spent:40 min    Kaydin Karbowski, Geraldo Docker, MD [facility] Pager [phone]  If 7PM-7AM, please contact night-coverage [email or web address] Password [email or web address] 08/23/2020, 1:42 PM

## 2020-08-23 NOTE — Progress Notes (Signed)
Cardiology Rounding Note  PCP-Cardiologist: Dietrich Pates, MD   Subjective:   Renal function significantly improved (creatinine 2.6 > 2.5 > 1.9> 1.3).  Denies any chest pain or dyspnea.  Lab Results  Component Value Date   CREATININE 1.29 (H) 08/23/2020   CREATININE 1.91 (H) 08/22/2020   CREATININE 2.52 (H) 08/21/2020   Denies shortness of breath.   Objective:   Weight Range: (!) 162.3 kg Body mass index is 49.89 kg/m.   Vital Signs:   Temp:  [97.4 F (36.3 C)-98.4 F (36.9 C)] 98.4 F (36.9 C) (12/05 0351) Pulse Rate:  [77-89] 78 (12/05 0351) Resp:  [17-20] 17 (12/05 0351) BP: (146-184)/(77-117) 148/77 (12/05 0440) SpO2:  [94 %-98 %] 94 % (12/05 0351) Weight:  [162.3 kg] 162.3 kg (12/05 0352)    Weight change: Filed Weights   08/21/20 1502 08/22/20 0552 08/23/20 0352  Weight: (!) 160.1 kg (!) 161.1 kg (!) 162.3 kg    Intake/Output:   Intake/Output Summary (Last 24 hours) at 08/23/2020 0801 Last data filed at 08/22/2020 2000 Gross per 24 hour  Intake 360 ml  Output 300 ml  Net 60 ml      Physical Exam    General:  Sitting on side of the bed.  No resp difficulty HEENT: normal Neck: supple. no JVD. Carotids 2+ bilat; no bruits. No lymphadenopathy or thryomegaly appreciated. Cor: RRR. No rubs, gallops or murmurs. Lungs: clear Abdomen: obese, soft, nontender, nondistended. No hepatosplenomegaly. No bruits or masses. Good bowel sounds. Extremities: no cyanosis, clubbing, rash, edema Neuro: alert & orientedx3,moves all 4 extremities w/o difficulty. Affect pleasant   Telemetry  NSR 80s.  NSVT x 5 beats  EKG    Not performed   Labs    CBC Recent Labs    08/22/20 0206 08/23/20 0334  WBC 7.5 7.3  NEUTROABS 3.8 3.7  HGB 13.4 12.8*  HCT 45.0 42.1  MCV 75.5* 75.2*  PLT 382 336   Basic Metabolic Panel Recent Labs    53/97/67 0206 08/23/20 0334  NA 136 135  K 3.5 4.0  CL 93* 98  CO2 32 29  GLUCOSE 116* 126*  BUN 43* 31*  CREATININE 1.91*  1.29*  CALCIUM 9.0 8.9  MG 1.8 1.8  PHOS 3.8 2.5   Liver Function Tests Recent Labs    08/22/20 0206 08/23/20 0334  AST 16 19  ALT 16 15  ALKPHOS 43 45  BILITOT 0.5 0.4  PROT 7.0 6.9  ALBUMIN 3.2* 3.3*   No results for input(s): LIPASE, AMYLASE in the last 72 hours. Cardiac Enzymes No results for input(s): CKTOTAL, CKMB, CKMBINDEX, TROPONINI in the last 72 hours.  BNP: BNP (last 3 results) Recent Labs    02/24/20 1501 08/19/20 1018  BNP 75.1 40.6    ProBNP (last 3 results) No results for input(s): PROBNP in the last 8760 hours.   D-Dimer No results for input(s): DDIMER in the last 72 hours. Hemoglobin A1C No results for input(s): HGBA1C in the last 72 hours. Fasting Lipid Panel Recent Labs    08/23/20 0334  CHOL 107  HDL 37*  LDLCALC 55  TRIG 77  CHOLHDL 2.9   Thyroid Function Tests No results for input(s): TSH, T4TOTAL, T3FREE, THYROIDAB in the last 72 hours.  Invalid input(s): FREET3  Other results:   Imaging    No results found.   Medications:     Scheduled Medications: . atorvastatin  40 mg Oral QHS  . diltiazem  240 mg Oral Daily  .  docusate sodium  100 mg Oral BID  . doxazosin  2 mg Oral Daily  . ferrous sulfate  325 mg Oral Daily  . folic acid  1 mg Oral Daily  . hydroxypropyl methylcellulose / hypromellose  1 drop Both Eyes Daily  . insulin aspart  0-15 Units Subcutaneous TID WC  . labetalol  400 mg Oral q12n4p  . magnesium oxide  400 mg Oral Daily  . multivitamin with minerals  1 tablet Oral Daily  . pantoprazole  40 mg Oral Daily  . potassium chloride SA  60 mEq Oral Daily  . rivaroxaban  20 mg Oral Q supper  . thiamine  100 mg Oral Daily   Or  . thiamine  100 mg Intravenous Daily    Infusions:   PRN Medications: acetaminophen, albuterol, hydrALAZINE, LORazepam **OR** LORazepam, methocarbamol, ondansetron (ZOFRAN) IV   Assessment/Plan   1. Chronic diastolic CHF: Echo in 10/20 with EF 65-70%.  TEE 12/3 showed LVEF  60 to 65%, mild RV dysfunction - he has had multiple admissions in the past for a/c HF in the setting of poor compliance w/ meds and dietary indiscretion w/ sodium. Now being admitted for a/c CHF in the setting of rapid afib after drug binge w/ ETOH and cocaine.  - Renal function improving with holding diuretics.  Will restart losartan given AKI resolved and BP remains elevated 2. Paroxsymal Atrial fibrillation:  - presented w afib w/ RVR after drug binge w/ ETOH and cocaine.  -Converted to sinus rhythm following TEE/DCCV on 12/30 -Continue cardizem 240 mg daily -Continue  Xarelto 240 mg daily.  3. H/o PE: 5/20, continue Xarelto.  4. OHS/OSA:CPAP QHS  5. HTN:  Stable.  6.  Polysubstance Use -  UDS + for cocaine and THC  8. Situational Depression  - management per IM  9. Hypokalemia/ Hypomagnesemia  -Supp K  Mag ok.   10. AKI - SCr 1.59 on admit, bumped to 2.5. Holding diuretics.  Improved 1.3 today 11. Chest Pain w/ mildly elevated Hs trop - hs trop 38>>86>>208>>288>>339 - 2D echo shows normal EF and no RWMAs - likely demand ischemia from rapid afib/ fluid overload and cocaine use however given trend, 2 day nuclear stress test ordered.  Second part today 12. DM - on insulin. Management per IM   Length of Stay: 4  Little Ishikawa, MD  08/23/2020, 8:01 AM

## 2020-08-23 NOTE — Progress Notes (Signed)
CSW received consult for substance use/education for patient. CSW spoke with patient at bedside. CSW offered patient resources for outpatient substance use treatment services. Patient accepted.  CSW will continue to follow.

## 2020-08-24 ENCOUNTER — Other Ambulatory Visit (HOSPITAL_COMMUNITY): Payer: Self-pay | Admitting: Adult Health

## 2020-08-24 ENCOUNTER — Other Ambulatory Visit: Payer: Self-pay | Admitting: Nurse Practitioner

## 2020-08-24 ENCOUNTER — Other Ambulatory Visit (HOSPITAL_COMMUNITY): Payer: Self-pay

## 2020-08-24 ENCOUNTER — Other Ambulatory Visit (HOSPITAL_COMMUNITY): Payer: Self-pay | Admitting: *Deleted

## 2020-08-24 DIAGNOSIS — E1165 Type 2 diabetes mellitus with hyperglycemia: Secondary | ICD-10-CM

## 2020-08-24 DIAGNOSIS — I5033 Acute on chronic diastolic (congestive) heart failure: Secondary | ICD-10-CM | POA: Diagnosis not present

## 2020-08-24 DIAGNOSIS — I4891 Unspecified atrial fibrillation: Secondary | ICD-10-CM | POA: Diagnosis not present

## 2020-08-24 DIAGNOSIS — E876 Hypokalemia: Secondary | ICD-10-CM

## 2020-08-24 DIAGNOSIS — R1084 Generalized abdominal pain: Secondary | ICD-10-CM

## 2020-08-24 DIAGNOSIS — IMO0002 Reserved for concepts with insufficient information to code with codable children: Secondary | ICD-10-CM

## 2020-08-24 DIAGNOSIS — I10 Essential (primary) hypertension: Secondary | ICD-10-CM

## 2020-08-24 LAB — COMPREHENSIVE METABOLIC PANEL
ALT: 16 U/L (ref 0–44)
AST: 17 U/L (ref 15–41)
Albumin: 3.1 g/dL — ABNORMAL LOW (ref 3.5–5.0)
Alkaline Phosphatase: 47 U/L (ref 38–126)
Anion gap: 8 (ref 5–15)
BUN: 26 mg/dL — ABNORMAL HIGH (ref 6–20)
CO2: 31 mmol/L (ref 22–32)
Calcium: 9.3 mg/dL (ref 8.9–10.3)
Chloride: 98 mmol/L (ref 98–111)
Creatinine, Ser: 1.17 mg/dL (ref 0.61–1.24)
GFR, Estimated: >60 mL/min (ref >60)
Glucose, Bld: 118 mg/dL — ABNORMAL HIGH (ref 70–99)
Potassium: 4 mmol/L (ref 3.5–5.1)
Sodium: 137 mmol/L (ref 135–145)
Total Bilirubin: 0.4 mg/dL (ref 0.3–1.2)
Total Protein: 6.7 g/dL (ref 6.5–8.1)

## 2020-08-24 LAB — CBC WITH DIFFERENTIAL/PLATELET
Abs Immature Granulocytes: 0.02 10*3/uL (ref 0.00–0.07)
Basophils Absolute: 0 10*3/uL (ref 0.0–0.1)
Basophils Relative: 0 %
Eosinophils Absolute: 0.4 10*3/uL (ref 0.0–0.5)
Eosinophils Relative: 6 %
HCT: 42.1 % (ref 39.0–52.0)
Hemoglobin: 12.8 g/dL — ABNORMAL LOW (ref 13.0–17.0)
Immature Granulocytes: 0 %
Lymphocytes Relative: 30 %
Lymphs Abs: 2.1 10*3/uL (ref 0.7–4.0)
MCH: 23.2 pg — ABNORMAL LOW (ref 26.0–34.0)
MCHC: 30.4 g/dL (ref 30.0–36.0)
MCV: 76.3 fL — ABNORMAL LOW (ref 80.0–100.0)
Monocytes Absolute: 0.8 10*3/uL (ref 0.1–1.0)
Monocytes Relative: 12 %
Neutro Abs: 3.5 10*3/uL (ref 1.7–7.7)
Neutrophils Relative %: 52 %
Platelets: 391 10*3/uL (ref 150–400)
RBC: 5.52 MIL/uL (ref 4.22–5.81)
RDW: 20 % — ABNORMAL HIGH (ref 11.5–15.5)
WBC: 6.9 10*3/uL (ref 4.0–10.5)
nRBC: 0 % (ref 0.0–0.2)

## 2020-08-24 LAB — PHOSPHORUS: Phosphorus: 3.5 mg/dL (ref 2.5–4.6)

## 2020-08-24 LAB — GLUCOSE, CAPILLARY: Glucose-Capillary: 87 mg/dL (ref 70–99)

## 2020-08-24 LAB — MAGNESIUM: Magnesium: 1.6 mg/dL — ABNORMAL LOW (ref 1.7–2.4)

## 2020-08-24 MED ORDER — METOLAZONE 5 MG PO TABS
5.0000 mg | ORAL_TABLET | ORAL | 3 refills | Status: DC
Start: 2020-08-24 — End: 2020-10-19

## 2020-08-24 MED ORDER — DOCUSATE SODIUM 100 MG PO CAPS: 100.0000 mg | ORAL_CAPSULE | Freq: Two times a day (BID) | ORAL | 2 refills | Status: AC

## 2020-08-24 MED ORDER — HYDRALAZINE HCL 25 MG PO TABS: 25.0000 mg | ORAL_TABLET | Freq: Three times a day (TID) | ORAL | Status: DC

## 2020-08-24 MED ORDER — DILTIAZEM HCL ER COATED BEADS 240 MG PO CP24
240.0000 mg | ORAL_CAPSULE | Freq: Every day | ORAL | 3 refills | Status: DC
Start: 2020-08-25 — End: 2021-01-10

## 2020-08-24 MED ORDER — MAGNESIUM OXIDE 400 (241.3 MG) MG PO TABS: 400.0000 mg | ORAL_TABLET | Freq: Every day | ORAL | Status: DC

## 2020-08-24 MED ORDER — MAGNESIUM SULFATE 4 GM/100ML IV SOLN
4.0000 g | Freq: Once | INTRAVENOUS | Status: AC
  Administered 2020-08-24: 4 g via INTRAVENOUS
  Filled 2020-08-24: qty 100

## 2020-08-24 MED ORDER — SPIRONOLACTONE 25 MG PO TABS
50.0000 mg | ORAL_TABLET | Freq: Every day | ORAL | Status: DC
  Administered 2020-08-24: 50 mg via ORAL
  Filled 2020-08-24: qty 2

## 2020-08-24 MED ORDER — HYDRALAZINE HCL 25 MG PO TABS
25.0000 mg | ORAL_TABLET | Freq: Three times a day (TID) | ORAL | 3 refills | Status: DC
Start: 2020-08-24 — End: 2020-09-17

## 2020-08-24 MED ORDER — POTASSIUM CHLORIDE ER 10 MEQ PO TBCR: 60.0000 meq | EXTENDED_RELEASE_TABLET | Freq: Every day | ORAL | 3 refills | Status: DC

## 2020-08-24 MED ORDER — TORSEMIDE 100 MG PO TABS
100.0000 mg | ORAL_TABLET | Freq: Two times a day (BID) | ORAL | Status: DC
  Administered 2020-08-24: 100 mg via ORAL
  Filled 2020-08-24: qty 1

## 2020-08-24 NOTE — Progress Notes (Signed)
Pt d/c from hosp today. There were numerous med changes so I took d/c list to pharmacy, confusion about his potassium dosing so that was figured out. Pharmacy did a week of bubble packs and I picked that up for him and took them to him, reviewed again how to use it and now all of the meds plus the potassium fits in the bubble pack and no more using 2 pill boxes for those.   Labs sch for next Monday.  Will f/u after those results.   Kerry Hough, EMT-Paramedic  08/24/20

## 2020-08-24 NOTE — Discharge Summary (Signed)
Physician Discharge Summary  Benjamin [name] PJA:[id number] DOB: 1965-10-14 DOA: 08/19/2020  PCP: [name], NP  Admit date: 08/19/2020 Discharge date: 08/24/2020  Time spent: 36 minutes  Recommendations for Outpatient Follow-up:  1. See MAR for important changes to meds as per cardiology 2. Needs Chem-12, CBC 1 week post discharge and close to transition visit with cardiologist  Discharge Diagnoses:  Active Problems:   Morbid obesity with BMI of 50.0-59.9, adult (HCC)   Chronic diastolic CHF (congestive heart failure) (HCC)   PAF (paroxysmal atrial fibrillation) (HCC)   OSA on CPAP   Acute on chronic diastolic CHF (congestive heart failure) (HCC)   Hypertension   Diabetic neuropathy (HCC)   Obesity hypoventilation syndrome (HCC)   Atrial fibrillation with rapid ventricular response (HCC)   Polysubstance abuse ([place])   Alcohol abuse   Discharge Condition: Improved  Diet recommendation: Heart healthy low-salt  Filed Weights   08/22/20 0552 08/23/20 0352 08/24/20 0503  Weight: (!) 161.1 kg (!) 162.3 kg (!) 163.7 kg    History of present illness:  54 year old morbidly obese African-American male BMI 50 HFpEF chronic A. fib on Eliquis HTN chronic hypoxic respiratory failure on 3 L oxygen at home PE CKD 2-3 OSA substance abuse Admitted in the setting of noncompliance medications 3 days found to be in rapid A. fib 12/1 in ED and admitted  Cardiology saw the patient in consult started medications he was cardioverted on 12/3 and stress test was performed which showed no infarct  Hospital Course:  A. fib RVR in the setting of noncompliance ethanol and cocaine Patient advised not to use illicit substances-started Cardizem 240 this admission and continue labetalol Patient instructed to continue meds as per cardiology  AECHF superimposed on chronic diastolic heart failure-weights and accurate this admission patient volume status difficult to ascertain Was diuresed and was  not short of breath on discharge  AKI superimposed on CKD- Diuretics initially held found to have some improvements and meds adjusted per cardiology  Polysubstance EtOH abuse He was kept on CIWA protocol and resolved    Procedures:  Lexiscan  DCCV 12/3  Consultations:  Cardiologist  Discharge Exam: Vitals:   08/24/20 0503 08/24/20 0900  BP: (!) 154/90 (!) 178/95  Pulse:    Resp: 16   Temp: 98.5 F (36.9 C)   SpO2: 100%     General: Awake coherent no distress EOMI NCAT Pleasant looking forward to going home Cardiovascular: S1-S2 no murmur no rub no gallop Respiratory: Clinically clear no rales no rhonchi Abdomen obese nontender no rebound Lower extremity edema pitting edema present  Discharge Instructions   Discharge Instructions    Amb referral to [facility]   Complete by: As directed    Diet - low sodium heart healthy   Complete by: As directed    Discharge instructions   Complete by: As directed    Take all ur labs as indicated Get labs checked in a couple days and follow with Cardiology   Increase activity slowly   Complete by: As directed      Allergies as of 08/24/2020   No Known Allergies     Medication List    STOP taking these medications   amLODipine 10 MG tablet Commonly known as: NORVASC     TAKE these medications   atorvastatin 40 MG tablet Commonly known as: LIPITOR Take 1 tablet (40 mg total) by mouth at bedtime.   Blood Pressure Monitor Devi Please provide patient with insurance approved blood pressure device  with L-XL cuff. BMI 55 What changed:   how much to take  how to take this  when to take this   carboxymethylcellulose 0.5 % Soln Commonly known as: REFRESH PLUS Place 1 drop into both eyes daily.   diltiazem 240 MG 24 hr capsule Commonly known as: CARDIZEM CD Take 1 capsule (240 mg total) by mouth daily. Start taking on: August 25, 2020   docusate sodium 100 MG capsule Commonly known as: COLACE Take 1  capsule (100 mg total) by mouth 2 (two) times daily.   doxazosin 2 MG tablet Commonly known as: Cardura Take 1 tablet (2 mg total) by mouth daily.   ferrous sulfate 325 (65 FE) MG tablet Take 1 tablet (325 mg total) by mouth daily.   hydrALAZINE 25 MG tablet Commonly known as: APRESOLINE Take 1 tablet (25 mg total) by mouth every 8 (eight) hours.   labetalol 200 MG tablet Commonly known as: NORMODYNE TAKE 2 TABLETS (400 MG TOTAL) BY MOUTH 2 (TWO) TIMES DAILY.   losartan 100 MG tablet Commonly known as: COZAAR Take 1 tablet (100 mg total) by mouth daily.   magnesium oxide 400 (241.3 Mg) MG tablet Commonly known as: MAG-OX TAKE 2 TABLETS BY MOUTH EVERY MORNING AND 1 TABLET EVERY EVENING (AM+NOON+BEDTIME) What changed: See the new instructions.   metFORMIN 500 MG tablet Commonly known as: GLUCOPHAGE TAKE ONE TABLET BY MOUTH TWICE DAILY WITH A MEAL (NOON+BEDTIME) What changed: See the new instructions.   methocarbamol 500 MG tablet Commonly known as: Robaxin Take 1 tablet (500 mg total) by mouth every 8 (eight) hours as needed for muscle spasms.   metolazone 5 MG tablet Commonly known as: ZAROXOLYN Take 1 tablet (5 mg total) by mouth See admin instructions. Take 1 tablet weekly till seen by cardiology What changed: additional instructions   Misc. Devices Misc Requires O2 @ 3L/min continuously via nasal canula and home fill system,. What changed:   how much to take  how to take this  when to take this   Misc. Devices Misc Please provide Mr. Carmen with insurance approved portable O2 concentrator ICD 10 J96.11 Z99.81 What changed:   how much to take  how to take this  when to take this   Misc. Devices Misc Please provide patient with XL sized insurance approved compression stockings. ICD 10 R60.0 and  I50.32 What changed: Another medication with the same name was changed. Make sure you understand how and when to take each.   omeprazole 20 MG capsule  Commonly known as: PRILOSEC TAKE 1 CAPSULE (20 MG TOTAL) BY MOUTH 2 (TWO) TIMES DAILY BEFORE A MEAL. TAKE FIRST CAPSULE 30 MIN PRIOR TO EATING OR TAKING OTHER MEDICATIONS.   potassium chloride 10 MEQ tablet Commonly known as: KLOR-CON TAKE 60 MEQ (6 TABS) IN THE MORNING, 60 MEQ (6 TABS) IN THE AFTERNOON AND 40 MEQ (4 TABS) IN THE EVENING. TAKE AN EXTRA 4 TABS EVERY DOSE OF METOLAZONE   Proventil HFA 108 (90 Base) MCG/ACT inhaler Generic drug: albuterol INHALE 2 PUFFS BY MOUTH EVERY 6 (SIX) HOURS AS NEEDED FOR WHEEZING OR SHORTNESS OF BREATH. What changed: Another medication with the same name was removed. Continue taking this medication, and follow the directions you see here.   spironolactone 50 MG tablet Commonly known as: ALDACTONE TAKE 1 TABLET (50 MG TOTAL) BY MOUTH DAILY.   torsemide 100 MG tablet Commonly known as: DEMADEX TAKE ONE TABLET BY MOUTH TWICE DAILY (NOON+BEDTIME) What changed: See the new instructions.  True Metrix Blood Glucose Test test strip Generic drug: glucose blood Use as instructed. Check blood glucose level by fingerstick twice per day. What changed:   how much to take  how to take this  when to take this   True Metrix Meter w/Device Kit Use as instructed. Check blood glucose level by fingerstick twice per day. What changed:   how much to take  how to take this  when to take this   TRUEplus Lancets 28G Misc Use as instructed. Check blood glucose level by fingerstick twice per day. What changed:   how much to take  how to take this  when to take this   Xarelto 20 MG Tabs tablet Generic drug: rivaroxaban TAKE 1 TABLET BY MOUTH DAILY WITH SUPPER (BEDTIME) What changed: See the new instructions.      No Known Allergies  Follow-up Information    [facility] Follow up on 09/07/2020.   Specialty: Cardiology Why: at 10:30  Contact information: [address] [phone] [place] [phone]               The results of significant diagnostics from this hospitalization (including imaging, microbiology, ancillary and laboratory) are listed below for reference.    Significant Diagnostic Studies: NM Myocar Multi W/Spect W/Wall Motion / EF  Result Date: 08/23/2020  There was no ST segment deviation noted during stress.  The left ventricular ejection fraction is moderately decreased (30-44%).  Nuclear stress EF: 43%.  This is a low risk study.  The study is normal.  Fixed apical inferior perfusion defect with normal wall motion in this area, consistent with artifact   DG Chest Port 1 View  Result Date: 08/19/2020 CLINICAL DATA:  Chest pain shortness of breath EXAM: PORTABLE CHEST 1 VIEW COMPARISON:  02/24/2020 FINDINGS: Trachea midline. Heart size remains enlarged with fullness of LEFT and RIGHT hilum as before. Heart size accentuated by portable technique. Increased interstitial markings throughout the chest. Small amount of fluid possible on the RIGHT minor fissure. No lobar consolidative changes. On limited assessment no acute skeletal process. IMPRESSION: Findings that favor heart failure and/or volume overload. Slight asymmetry could also be seen in the setting of atypical or viral infection. Electronically Signed   By: Zetta Bills M.D.   On: 08/19/2020 10:39   ECHOCARDIOGRAM COMPLETE  Result Date: 08/20/2020    ECHOCARDIOGRAM REPORT   Patient Name:   PAXTEN APPELT Date of Exam: 08/20/2020 Medical Rec #:  [id number]          Height:       71.0 in Accession #:    [id number]         Weight:       343.6 lb Date of Birth:  1965-12-10          BSA:          2.654 m Patient Age:    89 years           BP:           118/60 mmHg Patient Gender: M                  HR:           82 bpm. Exam Location:  Inpatient Procedure: 2D Echo, Color Doppler, Cardiac Doppler and Intracardiac            Opacification Agent Indications:    U27.25  Acute diastolic (congestive) heart  failure  History:        Patient has prior history of Echocardiogram examinations, most                 recent 07/17/2019. CHF, Arrythmias:Atrial Fibrillation; Risk                 Factors:Hypertension, Diabetes, Dyslipidemia and Sleep Apnea.  Sonographer:    Raquel Sarna Senior RDCS Referring Phys: [id number] SIMMONS  Sonographer Comments: Technically difficult study due to poor echo windows. IMPRESSIONS  1. Left ventricular ejection fraction, by estimation, is 60 to 65%. The left ventricle has normal function. The left ventricle has no regional wall motion abnormalities. There is severe left ventricular hypertrophy. Left ventricular diastolic function could not be evaluated.  2. Right ventricular systolic function is normal. The right ventricular size is normal.  3. The mitral valve is normal in structure. No evidence of mitral valve regurgitation. No evidence of mitral stenosis.  4. The aortic valve has an indeterminant number of cusps. Aortic valve regurgitation is not visualized. No aortic stenosis is present.  5. Aortic dilatation noted. There is mild dilatation of the aortic root, measuring 38 mm.  6. The inferior vena cava is normal in size with greater than 50% respiratory variability, suggesting right atrial pressure of 3 mmHg. FINDINGS  Left Ventricle: Left ventricular ejection fraction, by estimation, is 60 to 65%. The left ventricle has normal function. The left ventricle has no regional wall motion abnormalities. Definity contrast agent was given IV to delineate the left ventricular  endocardial borders. The left ventricular internal cavity size was normal in size. There is severe left ventricular hypertrophy. Left ventricular diastolic function could not be evaluated due to atrial fibrillation. Left ventricular diastolic function could not be evaluated. Right Ventricle: The right ventricular size is normal. Right ventricular systolic function is normal. Left Atrium:  Left atrial size was normal in size. Right Atrium: Right atrial size was normal in size. Pericardium: There is no evidence of pericardial effusion. Mitral Valve: The mitral valve is normal in structure. No evidence of mitral valve regurgitation. No evidence of mitral valve stenosis. Tricuspid Valve: The tricuspid valve is normal in structure. Tricuspid valve regurgitation is not demonstrated. No evidence of tricuspid stenosis. Aortic Valve: The aortic valve has an indeterminant number of cusps. Aortic valve regurgitation is not visualized. No aortic stenosis is present. Pulmonic Valve: The pulmonic valve was not well visualized. Pulmonic valve regurgitation is not visualized. No evidence of pulmonic stenosis. Aorta: Aortic dilatation noted. There is mild dilatation of the aortic root, measuring 38 mm. Venous: The inferior vena cava is normal in size with greater than 50% respiratory variability, suggesting right atrial pressure of 3 mmHg.  LEFT VENTRICLE PLAX 2D LVIDd:         4.20 cm LVIDs:         3.00 cm LV PW:         1.50 cm LV IVS:        1.80 cm LVOT diam:     2.40 cm LV SV:         68 LV SV Index:   26 LVOT Area:     4.52 cm  RIGHT VENTRICLE RV S prime:     11.10 cm/s TAPSE (M-mode): 1.8 cm LEFT ATRIUM             Index       RIGHT ATRIUM           Index LA diam:  4.00 cm 1.51 cm/m  RA Area:     18.70 cm LA Vol (A2C):   76.3 ml 28.75 ml/m RA Volume:   47.60 ml  17.94 ml/m LA Vol (A4C):   73.1 ml 27.55 ml/m LA Biplane Vol: 78.2 ml 29.47 ml/m  AORTIC VALVE LVOT Vmax:   106.15 cm/s LVOT Vmean:  72.500 cm/s LVOT VTI:    0.151 m  AORTA Ao Root diam: 3.80 cm Ao Asc diam:  3.30 cm  SHUNTS Systemic VTI:  0.15 m Systemic Diam: 2.40 cm Kirk Ruths MD Electronically signed by Kirk Ruths MD Signature Date/Time: 08/20/2020/1:16:39 PM    Final     Microbiology: Recent Results (from the past 240 hour(s))  Resp Panel by RT-PCR (Flu A&B, Covid) Nasopharyngeal Swab     Status: None   Collection Time:  08/19/20  2:08 PM   Specimen: Nasopharyngeal Swab; Nasopharyngeal(NP) swabs in vial transport medium  Result Value Ref Range Status   SARS Coronavirus 2 by RT PCR NEGATIVE NEGATIVE Final    Comment: (NOTE) SARS-CoV-2 target nucleic acids are NOT DETECTED.  The SARS-CoV-2 RNA is generally detectable in upper respiratory specimens during the acute phase of infection. The lowest concentration of SARS-CoV-2 viral copies this assay can detect is 138 copies/mL. A negative result does not preclude SARS-Cov-2 infection and should not be used as the sole basis for treatment or other patient management decisions. A negative result may occur with  improper specimen collection/handling, submission of specimen other than nasopharyngeal swab, presence of viral mutation(s) within the areas targeted by this assay, and inadequate number of viral copies(<138 copies/mL). A negative result must be combined with clinical observations, patient history, and epidemiological information. The expected result is Negative.  Fact Sheet for Patients:  EntrepreneurPulse.com.au  Fact Sheet for Healthcare Providers:  IncredibleEmployment.be  This test is no t yet approved or cleared by the [place] FDA and  has been authorized for detection and/or diagnosis of SARS-CoV-2 by FDA under an Emergency Use Authorization (EUA). This EUA will remain  in effect (meaning this test can be used) for the duration of the COVID-19 declaration under Section 564(b)(1) of the Act, 21 U.S.C.section 360bbb-3(b)(1), unless the authorization is terminated  or revoked sooner.       Influenza A by PCR NEGATIVE NEGATIVE Final   Influenza B by PCR NEGATIVE NEGATIVE Final    Comment: (NOTE) The Xpert Xpress SARS-CoV-2/FLU/RSV plus assay is intended as an aid in the diagnosis of influenza from Nasopharyngeal swab specimens and should not be used as a sole basis for treatment. Nasal washings and  aspirates are unacceptable for Xpert Xpress SARS-CoV-2/FLU/RSV testing.  Fact Sheet for Patients: EntrepreneurPulse.com.au  Fact Sheet for Healthcare Providers: IncredibleEmployment.be  This test is not yet approved or cleared by the [place] FDA and has been authorized for detection and/or diagnosis of SARS-CoV-2 by FDA under an Emergency Use Authorization (EUA). This EUA will remain in effect (meaning this test can be used) for the duration of the COVID-19 declaration under Section 564(b)(1) of the Act, 21 U.S.C. section 360bbb-3(b)(1), unless the authorization is terminated or revoked.  Performed at [facility], [place] [address] [place], [place] [address]      Labs: Basic Metabolic Panel: Recent Labs  Lab 08/20/20 2127 08/21/20 0250 08/22/20 0206 08/23/20 0334 08/24/20 0406  NA 134* 137 136 135 137  K 3.1* 3.3* 3.5 4.0 4.0  CL 91* 94* 93* 98 98  CO2 31 31 32 29 31  GLUCOSE  126* 128* 116* 126* 118*  BUN 39* 39* 43* 31* 26*  CREATININE 2.60* 2.52* 1.91* 1.29* 1.17  CALCIUM 8.7* 9.0 9.0 8.9 9.3  MG 2.1 2.2 1.8 1.8 1.6*  PHOS 5.2* 4.9* 3.8 2.5 3.5   Liver Function Tests: Recent Labs  Lab 08/20/20 2127 08/21/20 0250 08/22/20 0206 08/23/20 0334 08/24/20 0406  AST _0 ALT _1 ALKPHOS 45 48 43 45 47  BILITOT 0.5 0.3 0.5 0.4 0.4  PROT 7.2 7.5 7.0 6.9 6.7  ALBUMIN 3.2* 3.3* 3.2* 3.3* 3.1*   No results for input(s): LIPASE, AMYLASE in the last 168 hours. No results for input(s): AMMONIA in the last 168 hours. CBC: Recent Labs  Lab 08/20/20 2127 08/21/20 0250 08/22/20 0206 08/23/20 0334 08/24/20 0406  WBC 10.0 9.2 7.5 7.3 6.9  NEUTROABS  --  5.1 3.8 3.7 3.5  HGB 14.0 14.7 13.4 12.8* 12.8*  HCT 47.2 46.7 45.0 42.1 42.1  MCV 75.4* 74.8* 75.5* 75.2* 76.3*  PLT 396 396 382 336 391   Cardiac Enzymes: No results for input(s): CKTOTAL, CKMB, CKMBINDEX, TROPONINI in the last 168 hours.  BNP: BNP (last 3 results) Recent Labs    02/24/20 1501 08/19/20 1018  BNP 75.1 40.6    ProBNP (last 3 results) No results for input(s): PROBNP in the last 8760 hours.  CBG: Recent Labs  Lab 08/22/20 1225 08/22/20 1702 08/22/20 2121 08/23/20 0758 08/23/20 1217  GLUCAP 102* 95 127* 108* 115*       Signed:  Nita Sells MD   [facility] 08/24/2020, 12:04 PM

## 2020-08-24 NOTE — Progress Notes (Addendum)
Advanced Heart Failure Rounding Note  PCP-Cardiologist: Dietrich Pates, MD   Subjective:    Maintaining NSR on tele. HR 80s   NST completed and low risk.   BP elevated, 150s systolic   AKI resolved. SCr 1.17 today.   K 4.0. Mg low at 1.6.    NST 08/23/20  There was no ST segment deviation noted during stress.  The left ventricular ejection fraction is moderately decreased (30-44%).  Nuclear stress EF: 43%.  This is a low risk study.  The study is normal.   Fixed apical inferior perfusion defect with normal wall motion in this area, consistent with artifact  Objective:   Weight Range: (!) 163.7 kg Body mass index is 50.32 kg/m.   Vital Signs:   Temp:  [97.3 F (36.3 C)-98.5 F (36.9 C)] 98.5 F (36.9 C) (12/06 0503) Pulse Rate:  [75-84] 82 (12/05 1639) Resp:  [16-20] 16 (12/06 0503) BP: (120-169)/(83-94) 154/90 (12/06 0503) SpO2:  [94 %-100 %] 100 % (12/06 0503) Weight:  [163.7 kg] 163.7 kg (12/06 0503) Last BM Date: 08/23/20  Weight change: Filed Weights   08/22/20 0552 08/23/20 0352 08/24/20 0503  Weight: (!) 161.1 kg (!) 162.3 kg (!) 163.7 kg    Intake/Output:   Intake/Output Summary (Last 24 hours) at 08/24/2020 0800 Last data filed at 08/23/2020 2100 Gross per 24 hour  Intake 1276 ml  Output --  Net 1276 ml      Physical Exam    PHYSICAL EXAM: General:  Well appearing, obese. No respiratory difficulty HEENT: normal Neck: supple. Thick neck, JVD not well visualized. Carotids 2+ bilat; no bruits. No lymphadenopathy or thyromegaly appreciated. Cor: PMI nondisplaced. Regular rate & rhythm. No rubs, gallops or murmurs. Lungs: clear Abdomen: soft, nontender, nondistended. No hepatosplenomegaly. No bruits or masses. Good bowel sounds. Extremities: no cyanosis, clubbing, rash, edema Neuro: alert & oriented x 3, cranial nerves grossly intact. moves all 4 extremities w/o difficulty. Affect pleasant.    Telemetry    NSR 80s   EKG    Not  performed   Labs    CBC Recent Labs    08/23/20 0334 08/24/20 0406  WBC 7.3 6.9  NEUTROABS 3.7 3.5  HGB 12.8* 12.8*  HCT 42.1 42.1  MCV 75.2* 76.3*  PLT 336 391   Basic Metabolic Panel Recent Labs    62/94/76 0334 08/24/20 0406  NA 135 137  K 4.0 4.0  CL 98 98  CO2 29 31  GLUCOSE 126* 118*  BUN 31* 26*  CREATININE 1.29* 1.17  CALCIUM 8.9 9.3  MG 1.8 1.6*  PHOS 2.5 3.5   Liver Function Tests Recent Labs    08/23/20 0334 08/24/20 0406  AST 19 17  ALT 15 16  ALKPHOS 45 47  BILITOT 0.4 0.4  PROT 6.9 6.7  ALBUMIN 3.3* 3.1*   No results for input(s): LIPASE, AMYLASE in the last 72 hours. Cardiac Enzymes No results for input(s): CKTOTAL, CKMB, CKMBINDEX, TROPONINI in the last 72 hours.  BNP: BNP (last 3 results) Recent Labs    02/24/20 1501 08/19/20 1018  BNP 75.1 40.6    ProBNP (last 3 results) No results for input(s): PROBNP in the last 8760 hours.   D-Dimer No results for input(s): DDIMER in the last 72 hours. Hemoglobin A1C No results for input(s): HGBA1C in the last 72 hours. Fasting Lipid Panel Recent Labs    08/23/20 0334  CHOL 107  HDL 37*  LDLCALC 55  TRIG 77  CHOLHDL 2.9  Thyroid Function Tests No results for input(s): TSH, T4TOTAL, T3FREE, THYROIDAB in the last 72 hours.  Invalid input(s): FREET3  Other results:   Imaging    NM Myocar Multi W/Spect W/Wall Motion / EF  Result Date: 08/23/2020  There was no ST segment deviation noted during stress.  The left ventricular ejection fraction is moderately decreased (30-44%).  Nuclear stress EF: 43%.  This is a low risk study.  The study is normal.  Fixed apical inferior perfusion defect with normal wall motion in this area, consistent with artifact     Medications:     Scheduled Medications: . atorvastatin  40 mg Oral QHS  . diltiazem  240 mg Oral Daily  . docusate sodium  100 mg Oral BID  . doxazosin  2 mg Oral Daily  . ferrous sulfate  325 mg Oral Daily  .  folic acid  1 mg Oral Daily  . hydrALAZINE  10 mg Oral Q8H  . hydroxypropyl methylcellulose / hypromellose  1 drop Both Eyes Daily  . insulin aspart  0-15 Units Subcutaneous TID WC  . labetalol  400 mg Oral q12n4p  . losartan  100 mg Oral Daily  . magnesium oxide  400 mg Oral Daily  . multivitamin with minerals  1 tablet Oral Daily  . pantoprazole  40 mg Oral Daily  . potassium chloride SA  60 mEq Oral Daily  . rivaroxaban  20 mg Oral Q supper  . thiamine  100 mg Oral Daily   Or  . thiamine  100 mg Intravenous Daily    Infusions:   PRN Medications: acetaminophen, albuterol, hydrALAZINE, methocarbamol, ondansetron (ZOFRAN) IV   Assessment/Plan   1. Chronic diastolic CHF: Echo in 10/20 with EF 65-70%, RV interpreted as normal but suspect significant RV dysfunction.  - he has had multiple admissions in the past for a/c HF in the setting of poor compliance w/ meds and dietary indiscretion w/ sodium. Now being admitted for a/c CHF in the setting of rapid afib after drug binge w/ ETOH and cocaine.  - given IV lasix on admit and over diuresed w/ AKI (now resolved).  - resume previous home diuretic regimen, torsemide 100 mg bid  2. Paroxsymal Atrial fibrillation:  - presented w afib w/ RVR after drug binge w/ ETOH and cocaine.  - s/p TEE/ DCCV 12/3. Maintaining NSR  - Continue  Xarelto 20 mg daily. - Supp Mg  3. H/o PE: 5/20, continue Xarelto.  4. OHS/OSA:CPAP QHS  5. HTN:  - elevated, resume home torsemide dose + spiro  6.  Polysubstance Use -  UDS + for cocaine and THC  8. Situational Depression  - management per IM  9. Hypokalemia/ Hypomagnesemia  - K stable at , Mg low at 1.6>> give 4 gm MgSO4 x 1 10. AKI - resolved - SCr 1.59 on admit, bumped to 2.5. Diuretics held + IVF hydration. SCr normalized at 1.14 today  11. Chest Pain w/ mildly elevated Hs trop - hs trop 38>>86>>208>>288>>339 - 2D echo shows normal EF and no RWMAs - NST low risk  - likely demand ischemia from  rapid afib/ fluid overload and cocaine use  12. DM - on insulin. Management per IM   Length of Stay: 5  Brittainy Simmons, PA-C  08/24/2020, 8:00 AM  Advanced Heart Failure Team Pager [phone] (M-F; 7a - 4p)  Please contact [facility] for night-coverage after hours (4p -7a ) and weekends on [email or web address]  Patient seen with PA, agree with the above  note.   No complaints today, wants to go home. He is maintaining NSR.  BP still high.   General: NAD Neck: Thick, JVP 8 cm, no thyromegaly or thyroid nodule.  Lungs: Clear to auscultation bilaterally with normal respiratory effort. CV: Nondisplaced PMI.  Heart regular S1/S2, no S3/S4, no murmur.  No peripheral edema.   Abdomen: Soft, nontender, no hepatosplenomegaly, no distention.  Skin: Intact without lesions or rashes.  Neurologic: Alert and oriented x 3.  Psych: Normal affect. Extremities: No clubbing or cyanosis.  HEENT: Normal.   Cardiolite yesterday showed no ischemia/infarction (fixed apical septal defect looked like artifact).  EF low on Cardiolite but suggest false due to severe LVH.  TEE with EF 60-65%.    Creatinine significantly improved, down to 1.17.   He is maintaining NSR.   BP still elevated, restarted home torsemide and spironolactone, increase hydralazine to 25 mg tid.   Volume status difficult.  Do not think he will be Cardiomems candidate as he has Medicaid (not covered).   From my standpoint, he can go home today.  Will need close followup in CHF clinic and will get him set up with paramedicine.  Meds for home: losartan 100 mg daily, labetalol 400 mg bid, Xarelto 20 mg daily, atorvastatin 40 mg daily, torsemide 100 mg bid (previous home dose), metolazone 5 mg once a week (will reassess this as outpatient to decided on long-term regimen), spironolactone 50 mg daily, diltiazem CD 240 daily, doxazosin 2 daily, hydralazine 25 mg tid, KCl 60 daily.   Marca Ancona 08/24/2020 10:59 AM

## 2020-08-24 NOTE — Progress Notes (Signed)
Pt declines blood glucose testing.

## 2020-08-25 ENCOUNTER — Telehealth: Payer: Self-pay

## 2020-08-25 NOTE — Telephone Encounter (Signed)
Transition Care Management Follow-up Telephone Call  Date of discharge and from where: 08/24/2020, [facility]   How have you been since you were released from the hospital? He said he is feeling wonderful.   Any questions or concerns? No  Items Reviewed:  Did the pt receive and understand the discharge instructions provided? Yes   Medications obtained and verified? Yes  - he said that he has all of his medications, they are bubble packed and  Kerry Hough, EMT took care of getting the meds set up for him yesterday.    Other? No   Any new allergies since your discharge? No   Do you have support at home? lives alone.   Home Care and Equipment/Supplies: Were home health services ordered? no If so, what is the name of the agency? n/a Has the agency set up a time to come to the patient's home?  n/a Were any new equipment or medical supplies ordered?  No What is the name of the medical supply agency? n/a Were you able to get the supplies/equipment? N/a Do you have any questions related to the use of the equipment or supplies? No, n/a  He is followed closely by the [facility] as well as the [employer] program    Has O2 that he said he has been using @ 3 L continuously. Also has CPAP that he uses at night.   Functional Questionnaire: (I = Independent and D = Dependent) ADLs:  Independent.  [facility] assists with medication management  Follow up appointments reviewed:   PCP Hospital f/u appt confirmed? Yes  - Ms Meredeth Ide, NP 09/22/2020, this information was text to him as he requested.   Specialist Hospital f/u appt confirmed? Yes  - Foot/Ankle - 08/26/2020. Labs - 08/31/2020, cardiology- 09/07/2020  Are transportation arrangements needed? No  - he said he will contact medicaid transportation.   If their condition worsens, is the pt aware to call PCP or go to the Emergency Dept.? Yes  Was the patient provided with contact information for the PCP's  office or ED? he has the phone number for [facility]  Was to pt encouraged to call back with questions or concerns?  yes

## 2020-08-25 NOTE — Telephone Encounter (Signed)
NOTED

## 2020-08-25 NOTE — Telephone Encounter (Signed)
Transition Care Management Follow-up Telephone Call  Date of discharge and from where: 08/24/2020 from [facility]  How have you been since you were released from the hospital? Pt states that he feels like a million bucks. Pt states that all the providers and social workers made sure that he was prepared to go home with low likely hood of needing to go back to the hospital.   Any questions or concerns? No  Items Reviewed:  Did the pt receive and understand the discharge instructions provided? Yes   Medications obtained and verified? Yes   Other? No   Any new allergies since your discharge? No   Dietary orders reviewed? Heart Healthy Diet  Do you have support at home? Yes   Functional Questionnaire: (I = Independent and D = Dependent) ADLs: I  Bathing/Dressing- I  Meal Prep- I  Eating- I  Maintaining continence- I  Transferring/Ambulation- I  Managing Meds- I   Follow up appointments reviewed:   Specialist Hospital f/u appt confirmed? Yes  Scheduled to see [facility] on 08/31/2020 @ 1:45pm.  Are transportation arrangements needed? No   If their condition worsens, is the pt aware to call PCP or go to the Emergency Dept.? Yes  Was the patient provided with contact information for the PCP's office or ED? Yes  Was to pt encouraged to call back with questions or concerns? Yes

## 2020-08-26 ENCOUNTER — Other Ambulatory Visit: Payer: Self-pay

## 2020-08-26 ENCOUNTER — Encounter: Payer: Self-pay | Admitting: Podiatry

## 2020-08-26 ENCOUNTER — Ambulatory Visit: Payer: Medicaid Other | Admitting: Podiatry

## 2020-08-26 DIAGNOSIS — M2041 Other hammer toe(s) (acquired), right foot: Secondary | ICD-10-CM

## 2020-08-26 DIAGNOSIS — M19079 Primary osteoarthritis, unspecified ankle and foot: Secondary | ICD-10-CM

## 2020-08-26 DIAGNOSIS — M2042 Other hammer toe(s) (acquired), left foot: Secondary | ICD-10-CM | POA: Diagnosis not present

## 2020-08-28 ENCOUNTER — Other Ambulatory Visit (HOSPITAL_COMMUNITY): Payer: Self-pay | Admitting: *Deleted

## 2020-08-28 DIAGNOSIS — I5032 Chronic diastolic (congestive) heart failure: Secondary | ICD-10-CM

## 2020-08-28 DIAGNOSIS — M2042 Other hammer toe(s) (acquired), left foot: Secondary | ICD-10-CM | POA: Diagnosis not present

## 2020-08-28 DIAGNOSIS — M2041 Other hammer toe(s) (acquired), right foot: Secondary | ICD-10-CM | POA: Diagnosis not present

## 2020-08-28 MED ORDER — TRIAMCINOLONE ACETONIDE 10 MG/ML IJ SUSP
10.0000 mg | Freq: Once | INTRAMUSCULAR | Status: AC
  Administered 2020-08-28: 10 mg via INTRA_ARTICULAR

## 2020-08-28 NOTE — Progress Notes (Signed)
Subjective:  Patient ID: Benjamin [name], male    DOB: March 29, 1966,  MRN: [id number]  Chief Complaint  Patient presents with  . routine foot care    diabetic foot care nail trim. PT stated that he has a burning sensation in his toes     54 y.o. male presents with the above complaint.  Patient presents with complaint of left midfoot arthritis.  Patient states is painful to touch.  Patient would like to discuss treatment options for it.  She he is also diabetic as well.  He states that he would like to get diabetic shoes he has had them in the past which helped considerably.  Patient is a diabetic with last A1c of 6.1.  He denies any other acute complaints.   Review of Systems: Negative except as noted in the HPI. Denies N/V/F/Ch.  Past Medical History:  Diagnosis Date  . A-fib ([place])   . Anemia 05/29/2019  . CHF (congestive heart failure) ([place])   . Chronic respiratory failure ([place])   . Diabetes mellitus without complication ([place])   . Dyspnea   . Elevated troponin 02/06/2019  . Hypertension   . Obesity   . Pulmonary embolism ([place])   . Rectal bleeding 05/29/2019  . Sleep apnea     Current Outpatient Medications:  .  atorvastatin (LIPITOR) 40 MG tablet, Take 1 tablet (40 mg total) by mouth at bedtime., Disp: 90 tablet, Rfl: 2 .  Blood Glucose Monitoring Suppl (TRUE METRIX METER) w/Device KIT, Use as instructed. Check blood glucose level by fingerstick twice per day. (Patient taking differently: 1 each by Other route See admin instructions. Use as instructed. Check blood glucose level by fingerstick twice per day.), Disp: 1 kit, Rfl: 0 .  Blood Pressure Monitor DEVI, Please provide patient with insurance approved blood pressure device with L-XL cuff. BMI 55 (Patient taking differently: 1 each by Other route See admin instructions. Please provide patient with insurance approved blood pressure device with L-XL cuff. BMI 55), Disp: 1 each, Rfl: 0 .  carboxymethylcellulose (REFRESH PLUS)  0.5 % SOLN, Place 1 drop into both eyes daily. , Disp: , Rfl:  .  diltiazem (CARDIZEM CD) 240 MG 24 hr capsule, Take 1 capsule (240 mg total) by mouth daily., Disp: 30 capsule, Rfl: 3 .  docusate sodium (COLACE) 100 MG capsule, Take 1 capsule (100 mg total) by mouth 2 (two) times daily., Disp: 180 capsule, Rfl: 2 .  doxazosin (CARDURA) 2 MG tablet, Take 1 tablet (2 mg total) by mouth daily., Disp: 30 tablet, Rfl: 11 .  ferrous sulfate 325 (65 FE) MG tablet, Take 1 tablet (325 mg total) by mouth daily., Disp: 90 tablet, Rfl: 2 .  glucose blood (TRUE METRIX BLOOD GLUCOSE TEST) test strip, Use as instructed. Check blood glucose level by fingerstick twice per day. (Patient taking differently: 1 each by Other route See admin instructions. Use as instructed. Check blood glucose level by fingerstick twice per day.), Disp: 100 each, Rfl: 12 .  hydrALAZINE (APRESOLINE) 25 MG tablet, Take 1 tablet (25 mg total) by mouth every 8 (eight) hours., Disp: 90 tablet, Rfl: 3 .  labetalol (NORMODYNE) 200 MG tablet, TAKE 2 TABLETS (400 MG TOTAL) BY MOUTH 2 (TWO) TIMES DAILY., Disp: 120 tablet, Rfl: 3 .  losartan (COZAAR) 100 MG tablet, Take 1 tablet (100 mg total) by mouth daily., Disp: 30 tablet, Rfl: 11 .  magnesium oxide (MAG-OX) 400 (241.3 Mg) MG tablet, TAKE 2 TABLETS BY MOUTH EVERY MORNING AND 1  TABLET EVERY EVENING (AM+NOON+BEDTIME) (Patient taking differently: Take 400-800 mg by mouth See admin instructions. Take 800 mg by mouth in the morning and 400 mg in the evening), Disp: 90 tablet, Rfl: 3 .  metFORMIN (GLUCOPHAGE) 500 MG tablet, TAKE ONE TABLET BY MOUTH TWICE DAILY WITH A MEAL (NOON+BEDTIME), Disp: 180 tablet, Rfl: 0 .  methocarbamol (ROBAXIN) 500 MG tablet, Take 1 tablet (500 mg total) by mouth every 8 (eight) hours as needed for muscle spasms., Disp: 15 tablet, Rfl: 1 .  metolazone (ZAROXOLYN) 5 MG tablet, Take 1 tablet (5 mg total) by mouth See admin instructions. Take 1 tablet weekly till seen by  cardiology, Disp: 15 tablet, Rfl: 3 .  Misc. Devices MISC, Requires O2 @ 3L/min continuously via nasal canula and home fill system,. (Patient taking differently: 1 each by Other route See admin instructions. Requires O2 @ 3L/min continuously via nasal canula and home fill system,.), Disp: 1 each, Rfl: 0 .  Misc. Devices MISC, Please provide Mr. Donaghey with insurance approved portable O2 concentrator ICD 10 J96.11 [phone] (Patient taking differently: 1 each by Other route See admin instructions. Please provide Mr. Fill with insurance approved portable O2 concentrator ICD 10 J96.11 Z99.81), Disp: 1 each, Rfl: 0 .  Misc. Devices MISC, Please provide patient with XL sized insurance approved compression stockings. ICD 10 R60.0 and  I50.32, Disp: 1 each, Rfl: 0 .  omeprazole (PRILOSEC) 20 MG capsule, TAKE 1 CAPSULE BY MOUTH 2 (TWO) TIMES DAILY BEFORE A MEAL. TAKE FIRST CAPSULE 30 MIN PRIOR TO EATING OR TAKING OTHER MEDICATIONS. (AM+BEDTIME), Disp: 180 capsule, Rfl: 2 .  potassium chloride (KLOR-CON) 10 MEQ tablet, Take 6 tablets (60 mEq total) by mouth daily., Disp: 180 tablet, Rfl: 3 .  PROVENTIL HFA 108 (90 Base) MCG/ACT inhaler, INHALE 2 PUFFS BY MOUTH EVERY 6 (SIX) HOURS AS NEEDED FOR WHEEZING OR SHORTNESS OF BREATH. (Patient not taking: Reported on 08/20/2020), Disp: 6.7 g, Rfl: 2 .  spironolactone (ALDACTONE) 50 MG tablet, TAKE 1 TABLET (50 MG TOTAL) BY MOUTH DAILY (NOON), Disp: 30 tablet, Rfl: 3 .  torsemide (DEMADEX) 100 MG tablet, TAKE ONE TABLET BY MOUTH TWICE DAILY (NOON+BEDTIME) (Patient taking differently: Take 100 mg by mouth 2 (two) times daily. ), Disp: 180 tablet, Rfl: 3 .  TRUEplus Lancets 28G MISC, Use as instructed. Check blood glucose level by fingerstick twice per day. (Patient taking differently: 1 each by Other route See admin instructions. Use as instructed. Check blood glucose level by fingerstick twice per day.), Disp: 200 each, Rfl: 3 .  XARELTO 20 MG TABS tablet, TAKE 1 TABLET BY  MOUTH DAILY WITH SUPPER (BEDTIME) (Patient taking differently: Take 20 mg by mouth daily with supper. ), Disp: 60 tablet, Rfl: 6  Social History   Tobacco Use  Smoking Status Former Smoker  Smokeless Tobacco Never Used  Tobacco Comment   smoked weed in the past    No Known Allergies Objective:  There were no vitals filed for this visit. There is no height or weight on file to calculate BMI. Constitutional Well developed. Well nourished.  Vascular Dorsalis pedis pulses palpable bilaterally. Posterior tibial pulses palpable bilaterally. Capillary refill normal to all digits.  No cyanosis or clubbing noted. Pedal hair growth normal.  Neurologic Normal speech. Oriented to person, place, and time. Epicritic sensation to light touch grossly present bilaterally.  Dermatologic Nails well groomed and normal in appearance. No open wounds. No skin lesions.  Orthopedic:  Pain on palpation of left dorsal midfoot.  No pain with range of motion of the digits 2 through 5.  No pain with resisted dorsiflexion plantarflexion of the digits.  No pain with range of motion of the tarsometatarsal joints  Semiflexible hammertoe contractures noted bilaterally 2 through 5.  They appear to be semiflexible in nature.   Radiographs: None Assessment:   1. Arthritis of midfoot   2. Hammertoe, bilateral    Plan:  Patient was evaluated and treated and all questions answered.  Left midfoot arthritis -I explained to the patient the etiology of arthritis and various treatment options were discussed.  Given the amount of pain that is having I believe patient will benefit from a steroid injection to help decrease acute laboratory component associated pain.  Patient agrees with the plan would like to proceed with a steroid injection. -A steroid injection was performed at left dorsal midfoot using 1% plain Lidocaine and 10 mg of Kenalog. This was well tolerated.  Hammertoe bilateral -I explained to patient the  etiology of hammertoe contractures various treatment options were discussed.  Given that patient is a diabetic and a high risk of ulceration are associated with it given the contractures of the hammertoes.  Patient will benefit from diabetic shoes.  He will be scheduled to see Liliane Channel for diabetic shoes.  No follow-ups on file.

## 2020-08-31 ENCOUNTER — Other Ambulatory Visit (HOSPITAL_COMMUNITY): Payer: Medicaid Other

## 2020-09-01 DIAGNOSIS — J9611 Chronic respiratory failure with hypoxia: Secondary | ICD-10-CM | POA: Diagnosis not present

## 2020-09-01 DIAGNOSIS — I5032 Chronic diastolic (congestive) heart failure: Secondary | ICD-10-CM | POA: Diagnosis not present

## 2020-09-03 ENCOUNTER — Other Ambulatory Visit (HOSPITAL_COMMUNITY): Payer: Self-pay

## 2020-09-07 ENCOUNTER — Encounter (HOSPITAL_COMMUNITY): Payer: Self-pay

## 2020-09-07 ENCOUNTER — Other Ambulatory Visit (HOSPITAL_COMMUNITY): Payer: Self-pay

## 2020-09-07 ENCOUNTER — Ambulatory Visit (HOSPITAL_COMMUNITY)
Admit: 2020-09-07 | Discharge: 2020-09-07 | Disposition: A | Discharge status: Home / Self Care | Payer: Medicaid Other | Attending: Cardiology | Admitting: Cardiology

## 2020-09-07 ENCOUNTER — Other Ambulatory Visit: Payer: Self-pay

## 2020-09-07 VITALS — BP 134/82 | HR 84 | Wt 353.0 lb

## 2020-09-07 DIAGNOSIS — G4733 Obstructive sleep apnea (adult) (pediatric): Secondary | ICD-10-CM | POA: Diagnosis not present

## 2020-09-07 DIAGNOSIS — G5601 Carpal tunnel syndrome, right upper limb: Secondary | ICD-10-CM

## 2020-09-07 DIAGNOSIS — Z9989 Dependence on other enabling machines and devices: Secondary | ICD-10-CM | POA: Diagnosis not present

## 2020-09-07 DIAGNOSIS — Z6841 Body Mass Index (BMI) 40.0 and over, adult: Secondary | ICD-10-CM | POA: Insufficient documentation

## 2020-09-07 DIAGNOSIS — I110 Hypertensive heart disease with heart failure: Secondary | ICD-10-CM | POA: Diagnosis not present

## 2020-09-07 DIAGNOSIS — Z79899 Other long term (current) drug therapy: Secondary | ICD-10-CM | POA: Diagnosis not present

## 2020-09-07 DIAGNOSIS — Z87891 Personal history of nicotine dependence: Secondary | ICD-10-CM | POA: Insufficient documentation

## 2020-09-07 DIAGNOSIS — I2699 Other pulmonary embolism without acute cor pulmonale: Secondary | ICD-10-CM

## 2020-09-07 DIAGNOSIS — R778 Other specified abnormalities of plasma proteins: Secondary | ICD-10-CM | POA: Diagnosis not present

## 2020-09-07 DIAGNOSIS — Z86711 Personal history of pulmonary embolism: Secondary | ICD-10-CM | POA: Diagnosis not present

## 2020-09-07 DIAGNOSIS — Z7984 Long term (current) use of oral hypoglycemic drugs: Secondary | ICD-10-CM | POA: Insufficient documentation

## 2020-09-07 DIAGNOSIS — E119 Type 2 diabetes mellitus without complications: Secondary | ICD-10-CM | POA: Insufficient documentation

## 2020-09-07 DIAGNOSIS — I10 Essential (primary) hypertension: Secondary | ICD-10-CM

## 2020-09-07 DIAGNOSIS — I5032 Chronic diastolic (congestive) heart failure: Secondary | ICD-10-CM

## 2020-09-07 DIAGNOSIS — I480 Paroxysmal atrial fibrillation: Secondary | ICD-10-CM | POA: Diagnosis not present

## 2020-09-07 DIAGNOSIS — E118 Type 2 diabetes mellitus with unspecified complications: Secondary | ICD-10-CM | POA: Diagnosis not present

## 2020-09-07 DIAGNOSIS — K219 Gastro-esophageal reflux disease without esophagitis: Secondary | ICD-10-CM | POA: Insufficient documentation

## 2020-09-07 DIAGNOSIS — Z8616 Personal history of COVID-19: Secondary | ICD-10-CM | POA: Diagnosis not present

## 2020-09-07 DIAGNOSIS — Z596 Low income: Secondary | ICD-10-CM | POA: Diagnosis not present

## 2020-09-07 DIAGNOSIS — Z7901 Long term (current) use of anticoagulants: Secondary | ICD-10-CM | POA: Insufficient documentation

## 2020-09-07 LAB — BASIC METABOLIC PANEL
Anion gap: 11 (ref 5–15)
BUN: 19 mg/dL (ref 6–20)
CO2: 34 mmol/L — ABNORMAL HIGH (ref 22–32)
Calcium: 8.9 mg/dL (ref 8.9–10.3)
Chloride: 95 mmol/L — ABNORMAL LOW (ref 98–111)
Creatinine, Ser: 1.24 mg/dL (ref 0.61–1.24)
GFR, Estimated: >60 mL/min (ref >60)
Glucose, Bld: 91 mg/dL (ref 70–99)
Potassium: 2.9 mmol/L — ABNORMAL LOW (ref 3.5–5.1)
Sodium: 140 mmol/L (ref 135–145)

## 2020-09-07 LAB — CBC
HCT: 45.3 % (ref 39.0–52.0)
Hemoglobin: 13.1 g/dL (ref 13.0–17.0)
MCH: 22.4 pg — ABNORMAL LOW (ref 26.0–34.0)
MCHC: 28.9 g/dL — ABNORMAL LOW (ref 30.0–36.0)
MCV: 77.4 fL — ABNORMAL LOW (ref 80.0–100.0)
Platelets: 387 10*3/uL (ref 150–400)
RBC: 5.85 MIL/uL — ABNORMAL HIGH (ref 4.22–5.81)
RDW: 19.4 % — ABNORMAL HIGH (ref 11.5–15.5)
WBC: 5.8 10*3/uL (ref 4.0–10.5)
nRBC: 0 % (ref 0.0–0.2)

## 2020-09-07 LAB — BRAIN NATRIURETIC PEPTIDE: B Natriuretic Peptide: 34.1 pg/mL (ref 0.0–100.0)

## 2020-09-07 NOTE — Progress Notes (Signed)
Paramedicine Encounter    Patient ID: Benjamin [name], male    DOB: 03-17-1966, 54 y.o.   MRN: [id number]   Patient Care Team: Benjamin Pounds, NP as PCP - General (Nurse Practitioner) Benjamin Records, MD as PCP - Cardiology (Cardiology) Benjamin Ny, LCSW as Social Worker (Licensed Clinical Social Worker)  Patient Active Problem List   Diagnosis Date Noted   Polysubstance abuse ([place]) 08/20/2020   Alcohol abuse 08/20/2020   Atrial fibrillation with rapid ventricular response ([place]) 08/19/2020   Obesity hypoventilation syndrome ([place]) 06/12/2020   Healthcare maintenance 06/12/2020   Acute on chronic diastolic heart failure ([place]) 02/24/2020   Diabetic neuropathy ([place]) 11/20/2019   Coagulation disorder ([place]) 11/20/2019   Acute on chronic diastolic CHF (congestive heart failure), NYHA class 3 ([place]) 07/30/2019   Diabetes mellitus without complication ([place]) 40/98/1191   Hypertensive emergency    BRBPR (bright red blood per rectum) 06/12/2019   Hypertension    CHF (congestive heart failure) ([place])    Sleep apnea    Chronic respiratory failure ([place])    Hypokalemia 05/29/2019   GI bleed 05/29/2019   Anemia 05/28/2019   Acute on chronic diastolic CHF (congestive heart failure) ([place]) 04/11/2019   Iron deficiency anemia 03/11/2019   OSA on CPAP 03/11/2019   HLD (hyperlipidemia) 03/11/2019   Elevated troponin 03/11/2019   GERD (gastroesophageal reflux disease) 03/11/2019   Rectal bleeding 02/20/2019   Dyspnea 02/06/2019   COVID-19 virus infection 02/06/2019   Bilateral lower extremity edema    Morbid obesity with BMI of 50.0-59.9, adult (HCC)    Chronic diastolic CHF (congestive heart failure) (HCC)    PAF (paroxysmal atrial fibrillation) ([place])    PE (pulmonary thromboembolism) ([place]) 01/21/2019   Hypertensive urgency 01/21/2019   Diabetes mellitus type 2 in obese ([place]) 01/21/2019    Current Outpatient Medications:    atorvastatin (LIPITOR) 40 MG  tablet, Take 1 tablet (40 mg total) by mouth at bedtime., Disp: 90 tablet, Rfl: 2   Blood Glucose Monitoring Suppl (TRUE METRIX METER) w/Device KIT, Use as instructed. Check blood glucose level by fingerstick twice per day. (Patient taking differently: 1 each by Other route See admin instructions. Use as instructed. Check blood glucose level by fingerstick twice per day.), Disp: 1 kit, Rfl: 0   Blood Pressure Monitor DEVI, Please provide patient with insurance approved blood pressure device with L-XL cuff. BMI 55 (Patient taking differently: 1 each by Other route See admin instructions. Please provide patient with insurance approved blood pressure device with L-XL cuff. BMI 55), Disp: 1 each, Rfl: 0   carboxymethylcellulose (REFRESH PLUS) 0.5 % SOLN, Place 1 drop into both eyes daily. , Disp: , Rfl:    diltiazem (CARDIZEM CD) 240 MG 24 hr capsule, Take 1 capsule (240 mg total) by mouth daily., Disp: 30 capsule, Rfl: 3   docusate sodium (COLACE) 100 MG capsule, Take 1 capsule (100 mg total) by mouth 2 (two) times daily., Disp: 180 capsule, Rfl: 2   doxazosin (CARDURA) 2 MG tablet, Take 1 tablet (2 mg total) by mouth daily., Disp: 30 tablet, Rfl: 11   ferrous sulfate 325 (65 FE) MG tablet, Take 1 tablet (325 mg total) by mouth daily., Disp: 90 tablet, Rfl: 2   glucose blood (TRUE METRIX BLOOD GLUCOSE TEST) test strip, Use as instructed. Check blood glucose level by fingerstick twice per day. (Patient taking differently: 1 each by Other route See admin instructions. Use as instructed. Check blood glucose level by fingerstick twice per day.),  Disp: 100 each, Rfl: 12   hydrALAZINE (APRESOLINE) 25 MG tablet, Take 1 tablet (25 mg total) by mouth every 8 (eight) hours., Disp: 90 tablet, Rfl: 3   labetalol (NORMODYNE) 200 MG tablet, TAKE 2 TABLETS (400 MG TOTAL) BY MOUTH 2 (TWO) TIMES DAILY., Disp: 120 tablet, Rfl: 3   losartan (COZAAR) 100 MG tablet, Take 1 tablet (100 mg total) by mouth daily., Disp:  30 tablet, Rfl: 11   magnesium oxide (MAG-OX) 400 (241.3 Mg) MG tablet, TAKE 2 TABLETS BY MOUTH EVERY MORNING AND 1 TABLET EVERY EVENING (AM+NOON+BEDTIME) (Patient taking differently: Take 400-800 mg by mouth See admin instructions. Take 800 mg by mouth in the morning and 400 mg in the evening), Disp: 90 tablet, Rfl: 3   metFORMIN (GLUCOPHAGE) 500 MG tablet, TAKE ONE TABLET BY MOUTH TWICE DAILY WITH A MEAL (NOON+BEDTIME), Disp: 180 tablet, Rfl: 0   methocarbamol (ROBAXIN) 500 MG tablet, Take 1 tablet (500 mg total) by mouth every 8 (eight) hours as needed for muscle spasms., Disp: 15 tablet, Rfl: 1   metolazone (ZAROXOLYN) 5 MG tablet, Take 1 tablet (5 mg total) by mouth See admin instructions. Take 1 tablet weekly till seen by cardiology, Disp: 15 tablet, Rfl: 3   Misc. Devices MISC, Requires O2 @ 3L/min continuously via nasal canula and home fill system,. (Patient taking differently: 1 each by Other route See admin instructions. Requires O2 @ 3L/min continuously via nasal canula and home fill system,.), Disp: 1 each, Rfl: 0   Misc. Devices MISC, Please provide [name] with insurance approved portable O2 concentrator ICD 10 J96.11 [phone] (Patient taking differently: 1 each by Other route See admin instructions. Please provide [name] with insurance approved portable O2 concentrator ICD 10 J96.11 Z99.81), Disp: 1 each, Rfl: 0   Misc. Devices MISC, Please provide patient with XL sized insurance approved compression stockings. ICD 10 R60.0 and  I50.32, Disp: 1 each, Rfl: 0   omeprazole (PRILOSEC) 20 MG capsule, TAKE 1 CAPSULE BY MOUTH 2 (TWO) TIMES DAILY BEFORE A MEAL. TAKE FIRST CAPSULE 30 MIN PRIOR TO EATING OR TAKING OTHER MEDICATIONS. (AM+BEDTIME), Disp: 180 capsule, Rfl: 2   potassium chloride (KLOR-CON) 10 MEQ tablet, Take 6 tablets (60 mEq total) by mouth daily., Disp: 180 tablet, Rfl: 3   PROVENTIL HFA 108 (90 Base) MCG/ACT inhaler, INHALE 2 PUFFS BY MOUTH EVERY 6 (SIX) HOURS AS  NEEDED FOR WHEEZING OR SHORTNESS OF BREATH. (Patient not taking: Reported on 08/20/2020), Disp: 6.7 g, Rfl: 2   spironolactone (ALDACTONE) 50 MG tablet, TAKE 1 TABLET (50 MG TOTAL) BY MOUTH DAILY (NOON), Disp: 30 tablet, Rfl: 3   torsemide (DEMADEX) 100 MG tablet, TAKE ONE TABLET BY MOUTH TWICE DAILY (NOON+BEDTIME) (Patient taking differently: Take 100 mg by mouth 2 (two) times daily. ), Disp: 180 tablet, Rfl: 3   TRUEplus Lancets 28G MISC, Use as instructed. Check blood glucose level by fingerstick twice per day. (Patient taking differently: 1 each by Other route See admin instructions. Use as instructed. Check blood glucose level by fingerstick twice per day.), Disp: 200 each, Rfl: 3   XARELTO 20 MG TABS tablet, TAKE 1 TABLET BY MOUTH DAILY WITH SUPPER (BEDTIME) (Patient taking differently: Take 20 mg by mouth daily with supper. ), Disp: 60 tablet, Rfl: 6 No Known Allergies    Social History   Socioeconomic History   Marital status: Single    Spouse name: Not on file   Number of children: Not on file   Years of education:  Not on file   Highest education level: Not on file  Occupational History   Not on file  Tobacco Use   Smoking status: Former Smoker   Smokeless tobacco: Never Used   Tobacco comment: smoked weed in the past  Vaping Use   Vaping Use: Never used  Substance and Sexual Activity   Alcohol use: Not Currently   Drug use: Not Currently    Types: Marijuana   Sexual activity: Not Currently  Other Topics Concern   Not on file  Social History Narrative   Not on file   Social Determinants of Health   Financial Resource Strain: Medium Risk   Difficulty of Paying Living Expenses: Somewhat hard  Food Insecurity: Food Insecurity Present   Worried About Running Out of Food in the Last Year: Sometimes true   Ran Out of Food in the Last Year: Sometimes true  Transportation Needs: No Transportation Needs   Lack of Transportation (Medical): No   Lack of  Transportation (Non-Medical): No  Physical Activity: Not on file  Stress: Not on file  Social Connections: Not on file  Intimate Partner Violence: Not on file    Physical Exam      Future Appointments  Date Time Provider [place]  09/07/2020 10:30 AM [facility] PA/NP [facility] None  09/21/2020  1:30 PM [facility] PA/NP [facility] None  09/22/2020 10:50 AM Benjamin Pounds, NP [facility] None  09/24/2020 10:00 AM Velora Heckler [facility]  11/25/2020 10:15 AM Posey Pronto, Thomasene Lot, DPM [facility]    BP (!) 118/0    Pulse 90    Temp (!) 96.8 F (36 C)    Resp 20    Wt (!) 356 lb (161.5 kg)    SpO2 96%    BMI 49.65 kg/m   Weight yesterday-? Last visit weight-?  Pt reports he is doing great. He has a met a new woman and is very happy with her. His b/p is best I have seen since I cant remember when.  Pt denies sob, no dizziness, he has been staying clean since d/c. Taking meds. I p/u his meds from pharmacy and took them to him.  No edema noted. No c/p. No complaints today.  Will see him at clinic next week.   [name], [place] [facility], [facility], [facility] Paramedic  09/07/20

## 2020-09-07 NOTE — Progress Notes (Signed)
Advanced Heart Failure Clinic Note   Referring Physician: PCP: [name], NP PCP-Cardiologist: Dorris Carnes, MD  [facility]: Dr. Aundra Dubin   HPI: Mr.Williamsis a 54 yo with hx of diastolic CHF, HTN, DM, Atrial fib, PE in April 2020 (on Xarelto), OSA, GERD,chronic elevation of troponin.  Admitted 01/21/2019 with increased dyspnea and had PE. He was anticoagulated.   Patient re-admitted 5/20 for syncope/cough/dyspnea, COVID+, CT positive for bilateral GGOs, admitted to [facility] and discharged after 6 days, on O2. Rec'd Cards f/u.  Patient re-admitted 02/20/19 for bright red blood per rectum, eval'ed by GI, had EGD/colon, dx'd with hemorrhoidal bleeding, discharged after 9 days on O2.He was again Covid + .   Readmitted 03/11/19 with increased shortness of breath. He did not have home oxygen. He was discharged to home the next day.  He presented to [facility] ED7/23/20with lower extremity edema. COVID negative. Diuresed with lasix drip and transitioned to torsemide 40 mg twice daily. Had [place] with mild volume overload and preserved cardiac output. Discharge weight was 407.9 pounds.   He was readmitted with symptomatic anemia 05/28/19 with hgb 7.1. Received 1UPRBCs. GI consulted. He started on anusol and continued on stool softner.  He was readmitted to the hospital on 07/16/19 when he presented to the ED w/ worsening symptoms and was admitted by IM for a/c diastolic CHF and treated w/ IV Lasix. Had 2 day hospital stay and was discharged on 10/29. Echo was done 10/28 and showed normal LVEF 65-70%, RV interpreted as normal but suspect significant RV dysfunction.  After diuresis w/ IV Lasix, he was placed back on home diuretic regimen, torsemide 100 mg bid. Lisinopril 5 mg was also added to regimen. AHF team was not consulted that admit.  He was seen back in clinic 07/30/19. Felt poorly. SOB at rest and worse w/ exertion. Also w/ orthopnea/PND. Significant wt gain of 20 lb up to 428 lb (dry wt ~407 lb),  w/ marked abdominal distention and LEE and poor response to IV diuretics.  Once diuresed transitioned to torsemide 100 mg twice a day. D/C 408 pounds.   Zio patch on 10/20/19-No arrhythmia.   He was seen in clinic 10/21. Doing better. Still followed by paramedicine.Enrolled in a wellness program and exercises 2-3 days a week. Has modified diet and has lost ~70 lb. Fluid status ok on exam. No resting dyspnea. BP remains elevated. Paramedicine reports persistent, moderately elevated BP at home. Currently out of a CPAP device. He notes some occasional palpitations but EKG shows NSR. HR well controlled. Reports full compliance w/ Xarelto. No abnormal bleeding  Re-admitted 08/19/20-08/24/20 for rapid atrial fibrillation after being out of medications for 3 days. A. fib RVR in the setting of noncompliance ethanol and cocaine. Cardizem started and labetalol continued. 12/3 TEE (EF 60-65%) with DCCV successfully converted to SR. Diuresed with IV lasix and had AKI in setting of over diuresis. SCr 1.59 on admit, bumped to 2.5. Diuretics held + IVF hydration. SCr normalized at 1.14 on discharge.   He returns to clinic today for hospital follow up. Last used cocaine 3 weeks ago, consumed 1 beer since he was discharged. Breathing is OK, on 2-3L with activity, not very active. No CP, palpitations, edema, or PND/orthopnea. He has cpap replacement and wears it nightly. BPs at home ~118 with paramedicine. Weights at home ~354-355 lbs; down 7 lbs from discharge. Taking all medications as directed. Good urinary response after weekly metolazone. Followed by paramedicine. Complaints of right hand/finger numbness and tingling.   Labs (  11/20): BNP 32, K 3.6, creatinine 1.09  Labs (12/20): K 3.3, creatinine 1.28 Labs (10/10/19): K 3.9 Creatinine 1.4  Labs (11/05/19) : K 3.2 Creatinine 1.47  Labs (12/30/2019) : K 3.5 Creatinine 1.14  Labs (03/02/2020): K 3.8 Creatinine 1.09.  Labs (12/21): K 4.0, Creatinine 1.17, Magnesium  1.6  Review of Systems: All systems reviewed and negative except as per HPI.   PMH: 1. Atrial fibrillation: Paroxysmal.  - DCCV (08/21/20): converted to SR 2. Pulmonary embolus: 5/20.  3. OHS/OSA: He is on home oxygen during the day and uses CPAP at night.  4. Morbid obesity.  5. Chronic diastolic CHF:  - RHC (0/03): mean RA 12, PA 40/25 mean 31, mean PCWP 23, CI 2.47, PVR 1.1 WU.  - Echo (10/20): EF 65-70%, mild LVH, normal RV size and systolic function.  - TEE (12/21): EF 60-65% 6. Type 2 diabetes 7. HTN 8. Rectal bleeding: ?Hemorrhoidal.  9. COVID-19 infection 7/20.  10. ZIo Patch - 10/20/19 no arrhythmias  NST 08/23/20  There was no ST segment deviation noted during stress.  The left ventricular ejection fraction is moderately decreased (30-44%).  Nuclear stress EF: 43%.  This is a low risk study.  The study is normal.  Fixed apical inferior perfusion defect with normal wall motion in this area, consistent with artifact  Current Outpatient Medications  Medication Sig Dispense Refill   atorvastatin (LIPITOR) 40 MG tablet Take 1 tablet (40 mg total) by mouth at bedtime. 90 tablet 2   Blood Glucose Monitoring Suppl (TRUE METRIX METER) w/Device KIT Use as instructed. Check blood glucose level by fingerstick twice per day. (Patient taking differently: 1 each by Other route See admin instructions. Use as instructed. Check blood glucose level by fingerstick twice per day.) 1 kit 0   Blood Pressure Monitor DEVI Please provide patient with insurance approved blood pressure device with L-XL cuff. BMI 55 (Patient taking differently: 1 each by Other route See admin instructions. Please provide patient with insurance approved blood pressure device with L-XL cuff. BMI 55) 1 each 0   carboxymethylcellulose (REFRESH PLUS) 0.5 % SOLN Place 1 drop into both eyes daily.     diltiazem (CARDIZEM CD) 240 MG 24 hr capsule Take 1 capsule (240 mg total) by mouth daily. 30 capsule 3   docusate  sodium (COLACE) 100 MG capsule Take 1 capsule (100 mg total) by mouth 2 (two) times daily. 180 capsule 2   doxazosin (CARDURA) 2 MG tablet Take 1 tablet (2 mg total) by mouth daily. 30 tablet 11   ferrous sulfate 325 (65 FE) MG tablet Take 1 tablet (325 mg total) by mouth daily. 90 tablet 2   glucose blood (TRUE METRIX BLOOD GLUCOSE TEST) test strip Use as instructed. Check blood glucose level by fingerstick twice per day. (Patient taking differently: 1 each by Other route See admin instructions. Use as instructed. Check blood glucose level by fingerstick twice per day.) 100 each 12   hydrALAZINE (APRESOLINE) 25 MG tablet Take 1 tablet (25 mg total) by mouth every 8 (eight) hours. 90 tablet 3   labetalol (NORMODYNE) 200 MG tablet TAKE 2 TABLETS (400 MG TOTAL) BY MOUTH 2 (TWO) TIMES DAILY. 120 tablet 3   losartan (COZAAR) 100 MG tablet Take 1 tablet (100 mg total) by mouth daily. 30 tablet 11   magnesium oxide (MAG-OX) 400 (241.3 Mg) MG tablet TAKE 2 TABLETS BY MOUTH EVERY MORNING AND 1 TABLET EVERY EVENING (AM+NOON+BEDTIME) 90 tablet 3   metFORMIN (GLUCOPHAGE) 500 MG  tablet TAKE ONE TABLET BY MOUTH TWICE DAILY WITH A MEAL (NOON+BEDTIME) 180 tablet 0   methocarbamol (ROBAXIN) 500 MG tablet Take 1 tablet (500 mg total) by mouth every 8 (eight) hours as needed for muscle spasms. 15 tablet 1   metolazone (ZAROXOLYN) 5 MG tablet Take 1 tablet (5 mg total) by mouth See admin instructions. Take 1 tablet weekly till seen by cardiology 15 tablet 3   Misc. Devices MISC Requires O2 @ 3L/min continuously via nasal canula and home fill system,. (Patient taking differently: 1 each by Other route See admin instructions. Requires O2 @ 3L/min continuously via nasal canula and home fill system,.) 1 each 0   Misc. Devices MISC Please provide Mr. Medlen with insurance approved portable O2 concentrator ICD 10 J96.11 [phone] (Patient taking differently: 1 each by Other route See admin instructions. Please provide  Mr. Buening with insurance approved portable O2 concentrator ICD 10 J96.11 Z99.81) 1 each 0   Misc. Devices MISC Please provide patient with XL sized insurance approved compression stockings. ICD 10 R60.0 and  I50.32 1 each 0   omeprazole (PRILOSEC) 20 MG capsule TAKE 1 CAPSULE BY MOUTH 2 (TWO) TIMES DAILY BEFORE A MEAL. TAKE FIRST CAPSULE 30 MIN PRIOR TO EATING OR TAKING OTHER MEDICATIONS. (AM+BEDTIME) 180 capsule 2   potassium chloride (KLOR-CON) 10 MEQ tablet Take 6 tablets (60 mEq total) by mouth daily. 180 tablet 3   PROVENTIL HFA 108 (90 Base) MCG/ACT inhaler INHALE 2 PUFFS BY MOUTH EVERY 6 (SIX) HOURS AS NEEDED FOR WHEEZING OR SHORTNESS OF BREATH. 6.7 g 2   spironolactone (ALDACTONE) 50 MG tablet TAKE 1 TABLET (50 MG TOTAL) BY MOUTH DAILY (NOON) 30 tablet 3   torsemide (DEMADEX) 100 MG tablet TAKE ONE TABLET BY MOUTH TWICE DAILY (NOON+BEDTIME) 180 tablet 3   TRUEplus Lancets 28G MISC Use as instructed. Check blood glucose level by fingerstick twice per day. (Patient taking differently: 1 each by Other route See admin instructions. Use as instructed. Check blood glucose level by fingerstick twice per day.) 200 each 3   XARELTO 20 MG TABS tablet TAKE 1 TABLET BY MOUTH DAILY WITH SUPPER (BEDTIME) (Patient taking differently: Take 20 mg by mouth daily with supper.) 60 tablet 6   No current facility-administered medications for this encounter.    No Known Allergies    Social History   Socioeconomic History   Marital status: Single    Spouse name: Not on file   Number of children: Not on file   Years of education: Not on file   Highest education level: Not on file  Occupational History   Not on file  Tobacco Use   Smoking status: Former Smoker   Smokeless tobacco: Never Used   Tobacco comment: smoked weed in the past  Vaping Use   Vaping Use: Never used  Substance and Sexual Activity   Alcohol use: Not Currently   Drug use: Not Currently    Types: Marijuana    Sexual activity: Not Currently  Other Topics Concern   Not on file  Social History Narrative   Not on file   Social Determinants of Health   Financial Resource Strain: Medium Risk   Difficulty of Paying Living Expenses: Somewhat hard  Food Insecurity: Food Insecurity Present   Worried About Running Out of Food in the Last Year: Sometimes true   Ran Out of Food in the Last Year: Sometimes true  Transportation Needs: No Transportation Needs   Lack of Transportation (Medical): No  Lack of Transportation (Non-Medical): No  Physical Activity: Not on file  Stress: Not on file  Social Connections: Not on file  Intimate Partner Violence: Not on file      Family History  Problem Relation Age of Onset   Hypertension Mother    Diabetes Mother     Vitals:   09/07/20 1032  BP: 134/82  Pulse: 84  SpO2: 97%  Weight: (!) 160.1 kg   Wt Readings from Last 3 Encounters:  09/07/20 (!) 160.1 kg  09/07/20 (!) 161.5 kg  08/24/20 (!) 163.7 kg   PHYSICAL EXAM: General:  Obese male. No respiratory difficulty HEENT: normal Neck: supple. Thick neck, no JVD. Carotids 2+ bilat; no bruits. No lymphadenopathy or thyromegaly appreciated. Cor: PMI nondisplaced. Regular rate & rhythm. No rubs, gallops or murmurs. Lungs: clear Abdomen: obese, soft, nontender, nondistended. No hepatosplenomegaly. No bruits or masses. Good bowel sounds. Extremities: no cyanosis, clubbing, rash, edema, chronic venous stasis dermatitis bilaterally. +Phalen's & Tinel's on right  Neuro: alert & oriented x 3, cranial nerves grossly intact. moves all 4 extremities w/o difficulty. Affect pleasant.  ECG today 12/21: SR 83 bpm. (Personally reviewed.)  ASSESSMENT & PLAN:  1. Chronic diastolic CHF: Echo in 48/54 with EF 65-70%, RV interpreted as normal but suspect significant RV dysfunction.  -NYHA II-III, suspect obesity is a contributing factor. Volume status stable.  - Continue torsemide 100 mg bid and  metolazone 5 mg on Wednesdays. - Continue KCl 60 mEq daily. - Continue spironolactone to 50 mg daily.  - Continue losartan 100 mg daily. - BMET & BNP today.  2. Atrial fibrillation: Paroxysmal. NSR on EKG today.  - Denies abnormal bleeding.  - Continue Xarelto.  - CBC today.  3. H/o PE: 5/20, continue Xarelto.   4. OHS/OSA: Has new replacement part for CPAP now. - Continue home O2. - Continue using CPAP nightly.   5. HTN: stable in clinic today, SBP~118 last week with paramedicine. - Continue diltiazem 240 mg daily. - Continue doxazosin 2 mg daily. - Continue hydralazine 25 mg tid. - Continue labetalol 400 mg bid.  6. DM - per PCP. - hgb a1c 6.1 (12/21).  7. Obesity Body mass index is 49.23 kg/m.  - Has lost ~70 lb through wellness program. - Congratulated on efforts and encouraged to continue on current plan.  - Encouraged continued dietary changes and daily exercise to aid with continued weight loss efforts.  8. Carpal Tunnel Syndrome, Right - + Tinel's & Phalen's sign. Unclear if he has an aspect of OA contributing to symptoms as well. - Recommended conservative treatments-->wrist splinting at night, limiting salt and fluid intake. - Has follow up with PCP in January.  Follow up in 6 weeks w/ APP to assess BP and volume status. Continue HF Paramedicine.   [place], DNP, FNP-BC 09/07/2020 11:36 AM

## 2020-09-07 NOTE — Patient Instructions (Signed)
Routine lab work today. Will notify you of abnormal results  Follow up in 6 weeks.  

## 2020-09-07 NOTE — Progress Notes (Signed)
Paramedicine Encounter   Patient ID: Benjamin [name] , male,   DOB: 21-Jun-1966,54 y.o.,  MRN: [id number]   Met patient in clinic today with provider.   Weight @ clinic-352 lb HR-84 SP02-97% B/P-134/82  Pt did take meds yet this morning, he had just woken up prior to coming to clinic.  He is using his CPAP daily.  He reports the power was off for a few days last week but is back on now.  Labs today. EKG today.  No med changes right now unless labs come back abnormal.  She wants him back in 6 wks and see how he does and he might be able to come off the metolazone-just depends.    [name], [place] 09/07/2020

## 2020-09-10 DIAGNOSIS — J9611 Chronic respiratory failure with hypoxia: Secondary | ICD-10-CM | POA: Diagnosis not present

## 2020-09-10 DIAGNOSIS — I5032 Chronic diastolic (congestive) heart failure: Secondary | ICD-10-CM | POA: Diagnosis not present

## 2020-09-14 ENCOUNTER — Telehealth (HOSPITAL_COMMUNITY): Payer: Self-pay | Admitting: Licensed Clinical Social Worker

## 2020-09-14 NOTE — Telephone Encounter (Signed)
CSW received call from pt who wants help getting into a different substance abuse outpatient rehab program.  Pt was going to [facility] but was discharged for missing too many sessions.  Pt would like to go to a different center that provides more individual counseling.  CSW attempted to call [facility] to set up assessment appt but they are closed today.  Will plan to call with pt tomorrow to get assessment date.  Will continue to follow and assist as needed  [name], LCSW Clinical Social Worker Advanced [facility] Failure Clinic Desk#: [phone] Cell#: [phone]

## 2020-09-15 ENCOUNTER — Ambulatory Visit (HOSPITAL_COMMUNITY)
Admission: RE | Admit: 2020-09-15 | Discharge: 2020-09-15 | Disposition: A | Discharge status: Home / Self Care | Payer: Medicaid Other | Source: Ambulatory Visit | Attending: Cardiology | Admitting: Cardiology

## 2020-09-15 ENCOUNTER — Other Ambulatory Visit: Payer: Self-pay

## 2020-09-15 ENCOUNTER — Telehealth (HOSPITAL_COMMUNITY): Payer: Self-pay | Admitting: Licensed Clinical Social Worker

## 2020-09-15 DIAGNOSIS — I5032 Chronic diastolic (congestive) heart failure: Secondary | ICD-10-CM | POA: Insufficient documentation

## 2020-09-15 LAB — BASIC METABOLIC PANEL
Anion gap: 12 (ref 5–15)
BUN: 33 mg/dL — ABNORMAL HIGH (ref 6–20)
CO2: 30 mmol/L (ref 22–32)
Calcium: 9.1 mg/dL (ref 8.9–10.3)
Chloride: 94 mmol/L — ABNORMAL LOW (ref 98–111)
Creatinine, Ser: 2.54 mg/dL — ABNORMAL HIGH (ref 0.61–1.24)
GFR, Estimated: 29 mL/min — ABNORMAL LOW (ref >60)
Glucose, Bld: 119 mg/dL — ABNORMAL HIGH (ref 70–99)
Potassium: 3.1 mmol/L — ABNORMAL LOW (ref 3.5–5.1)
Sodium: 136 mmol/L (ref 135–145)

## 2020-09-15 NOTE — Telephone Encounter (Signed)
CSW and pt set up appt with [facility] for substance abuse counseling for Monday at 10am- will get assessment and set up a plan from there  Will continue to follow and assist as needed  [name], LCSW Clinical Social Worker [facility] Desk#: [phone] Cell#: [phone]

## 2020-09-16 ENCOUNTER — Other Ambulatory Visit (HOSPITAL_COMMUNITY): Payer: Self-pay

## 2020-09-16 NOTE — Progress Notes (Signed)
Paramedicine Encounter    Patient ID: FABRICIO ENDSLEY, male    DOB: [birth year], 54 y.o.   MRN: [id number]   Patient Care Team: [name], NP as PCP - General (Nurse Practitioner) [name], MD as PCP - Cardiology (Cardiology) [name], LCSW as Social Worker (Licensed Clinical Social Worker)  Patient Active Problem List   Diagnosis Date Noted  . Polysubstance abuse ([place]) 08/20/2020  . Alcohol abuse 08/20/2020  . Atrial fibrillation with rapid ventricular response ([place]) 08/19/2020  . Obesity hypoventilation syndrome ([place]) 06/12/2020  . Healthcare maintenance 06/12/2020  . Acute on chronic diastolic heart failure ([name]) 02/24/2020  . Diabetic neuropathy ([place]) 11/20/2019  . Coagulation disorder ([place]) 11/20/2019  . Acute on chronic diastolic CHF (congestive heart failure), NYHA class 3 ([place]) 07/30/2019  . Diabetes mellitus without complication ([place]) 40/98/1191  . Hypertensive emergency   . BRBPR (bright red blood per rectum) 06/12/2019  . Hypertension   . CHF (congestive heart failure) ([place])   . Sleep apnea   . Chronic respiratory failure ([place])   . Hypokalemia 05/29/2019  . GI bleed 05/29/2019  . Anemia 05/28/2019  . Acute on chronic diastolic CHF (congestive heart failure) ([place]) 04/11/2019  . Iron deficiency anemia 03/11/2019  . OSA on CPAP 03/11/2019  . HLD (hyperlipidemia) 03/11/2019  . Elevated troponin 03/11/2019  . GERD (gastroesophageal reflux disease) 03/11/2019  . Rectal bleeding 02/20/2019  . Dyspnea 02/06/2019  . COVID-19 virus infection 02/06/2019  . Bilateral lower extremity edema   . Morbid obesity with BMI of 50.0-59.9, adult ([place])   . Chronic diastolic CHF (congestive heart failure) ([place])   . PAF (paroxysmal atrial fibrillation) ([place])   . PE (pulmonary thromboembolism) ([place]) 01/21/2019  . Hypertensive urgency 01/21/2019  . Diabetes mellitus type 2 in obese ([place]) 01/21/2019    Current Outpatient Medications:  .  atorvastatin (LIPITOR) 40 MG  tablet, Take 1 tablet (40 mg total) by mouth at bedtime., Disp: 90 tablet, Rfl: 2 .  Blood Glucose Monitoring Suppl (TRUE METRIX METER) w/Device KIT, Use as instructed. Check blood glucose level by fingerstick twice per day. (Patient taking differently: 1 each by Other route See admin instructions. Use as instructed. Check blood glucose level by fingerstick twice per day.), Disp: 1 kit, Rfl: 0 .  Blood Pressure Monitor DEVI, Please provide patient with insurance approved blood pressure device with L-XL cuff. BMI 55 (Patient taking differently: 1 each by Other route See admin instructions. Please provide patient with insurance approved blood pressure device with L-XL cuff. BMI 55), Disp: 1 each, Rfl: 0 .  carboxymethylcellulose (REFRESH PLUS) 0.5 % SOLN, Place 1 drop into both eyes daily., Disp: , Rfl:  .  diltiazem (CARDIZEM CD) 240 MG 24 hr capsule, Take 1 capsule (240 mg total) by mouth daily., Disp: 30 capsule, Rfl: 3 .  docusate sodium (COLACE) 100 MG capsule, Take 1 capsule (100 mg total) by mouth 2 (two) times daily., Disp: 180 capsule, Rfl: 2 .  doxazosin (CARDURA) 2 MG tablet, Take 1 tablet (2 mg total) by mouth daily., Disp: 30 tablet, Rfl: 11 .  ferrous sulfate 325 (65 FE) MG tablet, Take 1 tablet (325 mg total) by mouth daily., Disp: 90 tablet, Rfl: 2 .  glucose blood (TRUE METRIX BLOOD GLUCOSE TEST) test strip, Use as instructed. Check blood glucose level by fingerstick twice per day. (Patient taking differently: 1 each by Other route See admin instructions. Use as instructed. Check blood glucose level by fingerstick twice per day.), Disp:  100 each, Rfl: 12 .  hydrALAZINE (APRESOLINE) 25 MG tablet, Take 1 tablet (25 mg total) by mouth every 8 (eight) hours., Disp: 90 tablet, Rfl: 3 .  labetalol (NORMODYNE) 200 MG tablet, TAKE 2 TABLETS (400 MG TOTAL) BY MOUTH 2 (TWO) TIMES DAILY., Disp: 120 tablet, Rfl: 3 .  losartan (COZAAR) 100 MG tablet, Take 1 tablet (100 mg total) by mouth daily., Disp:  30 tablet, Rfl: 11 .  magnesium oxide (MAG-OX) 400 (241.3 Mg) MG tablet, TAKE 2 TABLETS BY MOUTH EVERY MORNING AND 1 TABLET EVERY EVENING (AM+NOON+BEDTIME), Disp: 90 tablet, Rfl: 3 .  metFORMIN (GLUCOPHAGE) 500 MG tablet, TAKE ONE TABLET BY MOUTH TWICE DAILY WITH A MEAL (NOON+BEDTIME), Disp: 180 tablet, Rfl: 0 .  methocarbamol (ROBAXIN) 500 MG tablet, Take 1 tablet (500 mg total) by mouth every 8 (eight) hours as needed for muscle spasms., Disp: 15 tablet, Rfl: 1 .  metolazone (ZAROXOLYN) 5 MG tablet, Take 1 tablet (5 mg total) by mouth See admin instructions. Take 1 tablet weekly till seen by cardiology, Disp: 15 tablet, Rfl: 3 .  Misc. Devices MISC, Requires O2 @ 3L/min continuously via nasal canula and home fill system,. (Patient taking differently: 1 each by Other route See admin instructions. Requires O2 @ 3L/min continuously via nasal canula and home fill system,.), Disp: 1 each, Rfl: 0 .  Misc. Devices MISC, Please provide Mr. Wrightson with insurance approved portable O2 concentrator ICD 10 J96.11 [phone] (Patient taking differently: 1 each by Other route See admin instructions. Please provide Mr. Szczepanik with insurance approved portable O2 concentrator ICD 10 J96.11 [phone]), Disp: 1 each, Rfl: 0 .  Misc. Devices MISC, Please provide patient with XL sized insurance approved compression stockings. ICD 10 R60.0 and  I50.32, Disp: 1 each, Rfl: 0 .  omeprazole (PRILOSEC) 20 MG capsule, TAKE 1 CAPSULE BY MOUTH 2 (TWO) TIMES DAILY BEFORE A MEAL. TAKE FIRST CAPSULE 30 MIN PRIOR TO EATING OR TAKING OTHER MEDICATIONS. (AM+BEDTIME), Disp: 180 capsule, Rfl: 2 .  potassium chloride (KLOR-CON) 10 MEQ tablet, Take 6 tablets (60 mEq total) by mouth daily., Disp: 180 tablet, Rfl: 3 .  PROVENTIL HFA 108 (90 Base) MCG/ACT inhaler, INHALE 2 PUFFS BY MOUTH EVERY 6 (SIX) HOURS AS NEEDED FOR WHEEZING OR SHORTNESS OF BREATH., Disp: 6.7 g, Rfl: 2 .  spironolactone (ALDACTONE) 50 MG tablet, TAKE 1 TABLET (50 MG TOTAL) BY  MOUTH DAILY (NOON), Disp: 30 tablet, Rfl: 3 .  torsemide (DEMADEX) 100 MG tablet, TAKE ONE TABLET BY MOUTH TWICE DAILY (NOON+BEDTIME), Disp: 180 tablet, Rfl: 3 .  TRUEplus Lancets 28G MISC, Use as instructed. Check blood glucose level by fingerstick twice per day. (Patient taking differently: 1 each by Other route See admin instructions. Use as instructed. Check blood glucose level by fingerstick twice per day.), Disp: 200 each, Rfl: 3 .  XARELTO 20 MG TABS tablet, TAKE 1 TABLET BY MOUTH DAILY WITH SUPPER (BEDTIME) (Patient taking differently: Take 20 mg by mouth daily with supper.), Disp: 60 tablet, Rfl: 6 No Known Allergies    Social History   Socioeconomic History  . Marital status: Single    Spouse name: Not on file  . Number of children: Not on file  . Years of education: Not on file  . Highest education level: Not on file  Occupational History  . Not on file  Tobacco Use  . Smoking status: Former Research scientist (life sciences)  . Smokeless tobacco: Never Used  . Tobacco comment: smoked weed in the past  Vaping Use  .  Vaping Use: Never used  Substance and Sexual Activity  . Alcohol use: Not Currently  . Drug use: Not Currently    Types: Marijuana  . Sexual activity: Not Currently  Other Topics Concern  . Not on file  Social History Narrative  . Not on file   Social Determinants of Health   Financial Resource Strain: Medium Risk  . Difficulty of Paying Living Expenses: Somewhat hard  Food Insecurity: Food Insecurity Present  . Worried About Charity fundraiser in the Last Year: Sometimes true  . Ran Out of Food in the Last Year: Sometimes true  Transportation Needs: No Transportation Needs  . Lack of Transportation (Medical): No  . Lack of Transportation (Non-Medical): No  Physical Activity: Not on file  Stress: Not on file  Social Connections: Not on file  Intimate Partner Violence: Not on file    Physical Exam      Future Appointments  Date Time Provider [place]   09/22/2020 10:50 AM [name], NP [facility] None  09/24/2020 10:00 AM Velora Heckler [facility]  10/19/2020 11:30 AM [facility] PA/NP [facility] None  11/25/2020 10:15 AM Posey Pronto, Thomasene Lot, DPM [facility]    BP (!) 108/0   Pulse 74   Temp (!) 96.5 F (35.8 C)   Resp 18   SpO2 96%   Weight yesterday-? Last visit weight-352  Pt reports he is doing well. He denies increased sob, no dizziness, no h/a, no c/p.  No missed doses of his meds.  There was some med changes from labs yesterday. So I took his bubble pack back to pharmacy to get  It corrected.  He did not hold a torsemide dose today so that will be done tomor. He does have meds for tonight and tomor morning minus the torsemide. He had already taken the metolazone for today.  I advised him with the major cut back on his fluid pills he really needs to monitor his weight and if he notices it increasing then he needs to contact me or clinic asap.  Will see him again tomor to take bubble packs to him.   [name], [facility] Paramedic  09/16/20

## 2020-09-17 ENCOUNTER — Telehealth: Payer: Self-pay | Admitting: Podiatry

## 2020-09-17 ENCOUNTER — Other Ambulatory Visit (HOSPITAL_COMMUNITY): Payer: Self-pay | Admitting: *Deleted

## 2020-09-17 ENCOUNTER — Telehealth (HOSPITAL_COMMUNITY): Payer: Self-pay | Admitting: Licensed Clinical Social Worker

## 2020-09-17 ENCOUNTER — Other Ambulatory Visit (HOSPITAL_COMMUNITY): Payer: Self-pay

## 2020-09-17 MED ORDER — HYDRALAZINE HCL 50 MG PO TABS
25.0000 mg | ORAL_TABLET | Freq: Three times a day (TID) | ORAL | 3 refills | Status: DC
Start: 2020-09-17 — End: 2020-09-17

## 2020-09-17 MED ORDER — TORSEMIDE 20 MG PO TABS
ORAL_TABLET | ORAL | 3 refills | Status: DC
Start: 2020-09-17 — End: 2021-01-12

## 2020-09-17 MED ORDER — HYDRALAZINE HCL 50 MG PO TABS
50.0000 mg | ORAL_TABLET | Freq: Three times a day (TID) | ORAL | 3 refills | Status: DC
Start: 2020-09-17 — End: 2021-01-12

## 2020-09-17 NOTE — Telephone Encounter (Signed)
Called pt and let him know that we are not able to get pt diabetic shoes/inserts thru our office due to his insurance but told pt I was going to get Dr Allena Katz to write a rx and mail to him with the locations that are able to get pt his diabetic shoes/inserts.

## 2020-09-17 NOTE — Progress Notes (Signed)
P/u pts new bubble pack with the medication changes in them, he will now have to start taking potassium separately from bubble pack due to no more space in it.  He said he would be able to keep up with it. I rubber banded the pill bottle to the bubble pack to keep it together.     Kerry Hough, EMT-Paramedic  09/17/20

## 2020-09-17 NOTE — Telephone Encounter (Signed)
Community Paramedic brought in pt Work Preference paperwork to remain on waitlist for [employer]- CSW reviewed and sent in to Investment banker, corporate  Will continue to follow and assist as needed  [name], LCSW Clinical Social Worker [facility] Desk#: [phone] Cell#: [phone]

## 2020-09-21 ENCOUNTER — Encounter (HOSPITAL_COMMUNITY)

## 2020-09-22 ENCOUNTER — Telehealth (HOSPITAL_COMMUNITY): Payer: Self-pay | Admitting: Licensed Clinical Social Worker

## 2020-09-22 ENCOUNTER — Encounter: Payer: Self-pay | Admitting: Nurse Practitioner

## 2020-09-22 ENCOUNTER — Other Ambulatory Visit: Payer: Self-pay

## 2020-09-22 ENCOUNTER — Ambulatory Visit: Payer: Medicaid Other | Attending: Nurse Practitioner | Admitting: Nurse Practitioner

## 2020-09-22 VITALS — BP 138/80 | HR 86 | Temp 98.5°F | Ht 70.0 in | Wt 347.0 lb

## 2020-09-22 DIAGNOSIS — Z09 Encounter for follow-up examination after completed treatment for conditions other than malignant neoplasm: Secondary | ICD-10-CM

## 2020-09-22 DIAGNOSIS — G5601 Carpal tunnel syndrome, right upper limb: Secondary | ICD-10-CM

## 2020-09-22 DIAGNOSIS — J9611 Chronic respiratory failure with hypoxia: Secondary | ICD-10-CM

## 2020-09-22 DIAGNOSIS — E782 Mixed hyperlipidemia: Secondary | ICD-10-CM

## 2020-09-22 DIAGNOSIS — IMO0002 Reserved for concepts with insufficient information to code with codable children: Secondary | ICD-10-CM

## 2020-09-22 DIAGNOSIS — E118 Type 2 diabetes mellitus with unspecified complications: Secondary | ICD-10-CM | POA: Diagnosis not present

## 2020-09-22 DIAGNOSIS — E1165 Type 2 diabetes mellitus with hyperglycemia: Secondary | ICD-10-CM | POA: Diagnosis not present

## 2020-09-22 LAB — GLUCOSE, POCT (MANUAL RESULT ENTRY): POC Glucose: 128 mg/dl — AB (ref 70–99)

## 2020-09-22 MED ORDER — PROVENTIL HFA 108 (90 BASE) MCG/ACT IN AERS: INHALATION_SPRAY | RESPIRATORY_TRACT | 6 refills | Status: DC

## 2020-09-22 MED ORDER — TRUEPLUS LANCETS 28G MISC: 3 refills | Status: AC

## 2020-09-22 MED ORDER — TRUE METRIX BLOOD GLUCOSE TEST VI STRP: ORAL_STRIP | 12 refills | Status: DC

## 2020-09-22 MED ORDER — ATORVASTATIN CALCIUM 40 MG PO TABS: 40.0000 mg | ORAL_TABLET | Freq: Every day | ORAL | 2 refills | Status: DC

## 2020-09-22 NOTE — Telephone Encounter (Signed)
CSW received call from pt stating that he is having trouble with his SNAP benefits that he was supposed to receive today.  CSW able to go to [facility] SNAP benefit website and saw that in 2022 pt will be getting benefits on the 19th of every month- pt updated- states he is ok on food until that time  Will continue to follow and assist as needed  [name], LCSW Clinical Social Worker [facility] Desk#: [phone] Cell#: [phone]

## 2020-09-22 NOTE — Progress Notes (Signed)
Assessment & Plan:  Benjamin [name] was seen today for hospitalization follow-up.  Diagnoses and all orders for this visit:  Hospital discharge follow-up  Carpal tunnel syndrome on right -     Misc. Devices MISC; Please provide patient with insurance approved right hand splint ICD 10 G56.01  Diabetes mellitus type 2, uncontrolled, with complications ([place]) -     Glucose (CBG) -     TRUEplus Lancets 28G MISC; Use as instructed. Check blood glucose level by fingerstick twice per day. -     glucose blood (TRUE METRIX BLOOD GLUCOSE TEST) test strip; Use as instructed. Check blood glucose level by fingerstick twice per day. Continue blood sugar control as discussed in office today, low carbohydrate diet, and regular physical exercise as tolerated, 150 minutes per week (30 min each day, 5 days per week, or 50 min 3 days per week). Keep blood sugar logs with fasting goal of 90-130 mg/dl, post prandial (after you eat) less than 180.  For Hypoglycemia: BS <60 and Hyperglycemia BS >400; contact the clinic ASAP. Annual eye exams and foot exams are recommended.   Mixed hyperlipidemia -     atorvastatin (LIPITOR) 40 MG tablet; Take 1 tablet (40 mg total) by mouth at bedtime. INSTRUCTIONS: Work on a low fat, heart healthy diet and participate in regular aerobic exercise program by working out at least 150 minutes per week; 5 days a week-30 minutes per day. Avoid red meat/beef/steak,  fried foods. junk foods, sodas, sugary drinks, unhealthy snacking, alcohol and smoking.  Drink at least 80 oz of water per day and monitor your carbohydrate intake daily.    Chronic respiratory failure with hypoxia (HCC) -     PROVENTIL HFA 108 (90 Base) MCG/ACT inhaler; INHALE 2 PUFFS BY MOUTH EVERY 6 (SIX) HOURS AS NEEDED FOR WHEEZING OR SHORTNESS OF BREATH.    Patient has been counseled on age-appropriate routine health concerns for screening and prevention. These are reviewed and up-to-date. Referrals have been placed  accordingly. Immunizations are up-to-date or declined.    Subjective:   Chief Complaint  Patient presents with   Hospitalization Follow-up    Pt. Is here for hospital follow up.    HPI Benjamin [name] 55 y.o. male presents to office today for HFU PMH of diastolic CHF, HTN, DM, Atrial fib, PE (on Xarelto), OSA, (compliant with CPAP)  GERD, chronic elevation of troponin. He is being followed closely by the heart failure program and Paramedic with last visit on 09-16-2020.   Admitted 12-1 through 12-6 He has since been evaluated by the Paramedicine program and Cardiology. He is stable and doing well today.  He was admitted to the hospital with Rapid AFib due to poor medication adherence and per hospital note: alcohol and cocaine use. Cardizem was initiated and he was diuresed with IV lasix. DCCV with successful conversion to NSR. Today he states he has not used cocaine since he was admitted to the hospital. Continues on 2-3 L O2 [place]. He falls asleep several times during the office visit today. Endorses CPAP compliance nightly however. Blood pressure is well controlled today.   Still with complaints of right hand neuropathy. Pain described as "fire" and numbness"". Has not been wearing a hand splint. Will order one for him today.   He is still waiting on his diabetic shoes. Has recently seen podiatry and they are working on this.  Denies chest pain, worsening shortness of breath, palpitations, lightheadedness, dizziness, headaches or worsening BLE edema.  BP Readings from  Last 3 Encounters:  09/24/20 (!) 148/98  09/22/20 138/80  09/16/20 (!) 108/0     Review of Systems  Constitutional: Negative for fever, malaise/fatigue and weight loss.  HENT: Negative.  Negative for nosebleeds.   Eyes: Negative.  Negative for blurred vision, double vision and photophobia.  Respiratory: Negative.  Negative for cough and shortness of breath.   Cardiovascular: Negative.  Negative for chest pain,  palpitations and leg swelling.  Gastrointestinal: Negative.  Negative for heartburn, nausea and vomiting.  Musculoskeletal: Positive for joint pain. Negative for myalgias.  Neurological: Positive for tingling. Negative for dizziness, focal weakness, seizures and headaches.  Psychiatric/Behavioral: Negative.  Negative for suicidal ideas.    Past Medical History:  Diagnosis Date   A-fib ([place])    Anemia 05/29/2019   CHF (congestive heart failure) (HCC)    Chronic respiratory failure (HCC)    Diabetes mellitus without complication (HCC)    Dyspnea    Elevated troponin 02/06/2019   Hypertension    Obesity    Pulmonary embolism (HCC)    Rectal bleeding 05/29/2019   Sleep apnea     Past Surgical History:  Procedure Laterality Date   CARDIOVERSION N/A 08/21/2020   Procedure: CARDIOVERSION;  Surgeon: Larey Dresser, MD;  Location: [place];  Service: Cardiovascular;  Laterality: N/A;   COLONOSCOPY WITH PROPOFOL Left 02/25/2019   Procedure: COLONOSCOPY WITH PROPOFOL;  Surgeon: Arta Silence, MD;  Location: [place];  Service: Endoscopy;  Laterality: Left;   ESOPHAGOGASTRODUODENOSCOPY (EGD) WITH PROPOFOL Left 02/25/2019   Procedure: ESOPHAGOGASTRODUODENOSCOPY (EGD) WITH PROPOFOL;  Surgeon: Arta Silence, MD;  [facility] END[facility]COPY;  Service: Endoscopy;  Laterality: Left;   ESOPHAGOGASTRODUODENOSCOPY (EGD) WITH PROPOFOL N/A 06/12/2019   Procedure: ESOPHAGOGASTRODUODENOSCOPY (EGD) WITH PROPOFOL;  Surgeon: Clarene Essex, MD;  Location: [place];  Service: Gastroenterology;  Laterality: N/A;   FLEXIBLE SIGMOIDOSCOPY N/A 06/12/2019   Procedure: FLEXIBLE SIGMOIDOSCOPY;  Surgeon: Clarene Essex, MD;  Location: [place];  Service: Gastroenterology;  Laterality: N/A;   HEMORRHOID BANDING  05/2019   NO PAST SURGERIES     RIGHT HEART CATH N/A 04/17/2019   Procedure: RIGHT HEART CATH;  Surgeon: Larey Dresser, MD;  Location: [place] [facility];  Service: Cardiovascular;   Laterality: N/A;   TEE WITHOUT CARDIOVERSION N/A 08/21/2020   Procedure: TRANSESOPHAGEAL ECHOCARDIOGRAM (TEE);  Surgeon: Larey Dresser, MD;  Location[facility] ENDOSCOPY;  Service: Cardiovascular;  Laterality: N/A;    Family History  Problem Relation Age of Onset   Hypertension Mother    Diabetes Mother     Social History Reviewed with no changes to be made today.   Outpatient Medications Prior to Visit  Medication Sig Dispense Refill   Blood Glucose Monitoring Suppl (TRUE METRIX METER) w/Device KIT Use as instructed. Check blood glucose level by fingerstick twice per day. (Patient taking differently: 1 each by Other route See admin instructions. Use as instructed. Check blood glucose level by fingerstick twice per day.) 1 kit 0   Blood Pressure Monitor DEVI Please provide patient with insurance approved blood pressure device with L-XL cuff. BMI 55 (Patient taking differently: 1 each by Other route See admin instructions. Please provide patient with insurance approved blood pressure device with L-XL cuff. BMI 55) 1 each 0   hydrALAZINE (APRESOLINE) 50 MG tablet Take 1 tablet (50 mg total) by mouth 3 (three) times daily. 90 tablet 3   losartan (COZAAR) 100 MG tablet Take 1 tablet (100 mg total) by mouth daily. (Patient not taking: Reported on 09/24/2020) 30  tablet 11   magnesium oxide (MAG-OX) 400 (241.3 Mg) MG tablet TAKE 2 TABLETS BY MOUTH EVERY MORNING AND 1 TABLET EVERY EVENING (AM+NOON+BEDTIME) 90 tablet 3   metFORMIN (GLUCOPHAGE) 500 MG tablet TAKE ONE TABLET BY MOUTH TWICE DAILY WITH A MEAL (NOON+BEDTIME) 180 tablet 0   methocarbamol (ROBAXIN) 500 MG tablet Take 1 tablet (500 mg total) by mouth every 8 (eight) hours as needed for muscle spasms. (Patient not taking: Reported on 09/24/2020) 15 tablet 1   metolazone (ZAROXOLYN) 5 MG tablet Take 1 tablet (5 mg total) by mouth See admin instructions. Take 1 tablet weekly till seen by cardiology (Patient not taking: Reported on 09/24/2020) 15  tablet 3   Misc. Devices MISC Requires O2 @ 3L/min continuously via nasal canula and home fill system,. (Patient taking differently: 1 each by Other route See admin instructions. Requires O2 @ 3L/min continuously via nasal canula and home fill system,.) 1 each 0   Misc. Devices MISC Please provide Mr. Stgermain with insurance approved portable O2 concentrator ICD 10 J96.11 [phone] (Patient taking differently: 1 each by Other route See admin instructions. Please provide Mr. Amos with insurance approved portable O2 concentrator ICD 10 J96.11 Z99.81) 1 each 0   Misc. Devices MISC Please provide patient with XL sized insurance approved compression stockings. ICD 10 R60.0 and  I50.32 1 each 0   omeprazole (PRILOSEC) 20 MG capsule TAKE 1 CAPSULE BY MOUTH 2 (TWO) TIMES DAILY BEFORE A MEAL. TAKE FIRST CAPSULE 30 MIN PRIOR TO EATING OR TAKING OTHER MEDICATIONS. (AM+BEDTIME) 180 capsule 2   potassium chloride (KLOR-CON) 10 MEQ tablet Take 6 tablets (60 mEq total) by mouth daily. 180 tablet 3   spironolactone (ALDACTONE) 50 MG tablet TAKE 1 TABLET (50 MG TOTAL) BY MOUTH DAILY (NOON) 30 tablet 3   torsemide (DEMADEX) 20 MG tablet Take 68m by mouth twice daily 180 tablet 3   XARELTO 20 MG TABS tablet TAKE 1 TABLET BY MOUTH DAILY WITH SUPPER (BEDTIME) (Patient taking differently: Take 20 mg by mouth daily with supper.) 60 tablet 6   atorvastatin (LIPITOR) 40 MG tablet Take 1 tablet (40 mg total) by mouth at bedtime. 90 tablet 2   glucose blood (TRUE METRIX BLOOD GLUCOSE TEST) test strip Use as instructed. Check blood glucose level by fingerstick twice per day. (Patient taking differently: 1 each by Other route See admin instructions. Use as instructed. Check blood glucose level by fingerstick twice per day.) 100 each 12   labetalol (NORMODYNE) 200 MG tablet TAKE 2 TABLETS (400 MG TOTAL) BY MOUTH 2 (TWO) TIMES DAILY. 120 tablet 3   PROVENTIL HFA 108 (90 Base) MCG/ACT inhaler INHALE 2 PUFFS BY MOUTH EVERY  6 (SIX) HOURS AS NEEDED FOR WHEEZING OR SHORTNESS OF BREATH. 6.7 g 2   TRUEplus Lancets 28G MISC Use as instructed. Check blood glucose level by fingerstick twice per day. (Patient taking differently: 1 each by Other route See admin instructions. Use as instructed. Check blood glucose level by fingerstick twice per day.) 200 each 3   carboxymethylcellulose (REFRESH PLUS) 0.5 % SOLN Place 1 drop into both eyes daily.     diltiazem (CARDIZEM CD) 240 MG 24 hr capsule Take 1 capsule (240 mg total) by mouth daily. 30 capsule 3   docusate sodium (COLACE) 100 MG capsule Take 1 capsule (100 mg total) by mouth 2 (two) times daily. 180 capsule 2   doxazosin (CARDURA) 2 MG tablet Take 1 tablet (2 mg total) by mouth daily. 30 tablet 11  ferrous sulfate 325 (65 FE) MG tablet Take 1 tablet (325 mg total) by mouth daily. 90 tablet 2   No facility-administered medications prior to visit.    No Known Allergies     Objective:    BP 138/80 (BP Location: Right Arm, Patient Position: Sitting, Cuff Size: Large)    Pulse 86    Temp 98.5 F (36.9 C) (Oral)    Ht 5' 10"  (1.778 m)    Wt (!) 347 lb (157.4 kg)    SpO2 93%    BMI 49.79 kg/m  Wt Readings from Last 3 Encounters:  09/22/20 (!) 347 lb (157.4 kg)  09/07/20 (!) 353 lb (160.1 kg)  09/07/20 (!) 356 lb (161.5 kg)    Physical Exam Vitals and nursing note reviewed.  Constitutional:      Appearance: He is well-developed and well-nourished.  HENT:     Head: Normocephalic and atraumatic.  Eyes:     Extraocular Movements: EOM normal.  Cardiovascular:     Rate and Rhythm: Normal rate and regular rhythm.     Pulses: Intact distal pulses.     Heart sounds: Normal heart sounds. No murmur heard. No friction rub. No gallop.   Pulmonary:     Effort: No tachypnea or respiratory distress.     Breath sounds: Normal breath sounds. No decreased breath sounds, wheezing, rhonchi or rales.  Chest:     Chest wall: No tenderness.  Abdominal:     General:  Bowel sounds are normal.     Palpations: Abdomen is soft.  Musculoskeletal:        General: No edema. Normal range of motion.     Cervical back: Normal range of motion.  Skin:    General: Skin is warm and dry.  Neurological:     Mental Status: He is alert and oriented to person, place, and time.     Coordination: Coordination normal.  Psychiatric:        Mood and Affect: Mood and affect normal.        Behavior: Behavior normal. Behavior is cooperative.        Thought Content: Thought content normal.        Judgment: Judgment normal.          Patient has been counseled extensively about nutrition and exercise as well as the importance of adherence with medications and regular follow-up. The patient was given clear instructions to go to ER or return to medical center if symptoms don't improve, worsen or new problems develop. The patient verbalized understanding.   Follow-up: Return in about 3 months (around 12/21/2020).   [name], FNP-BC [facility] [place], [place]   09/27/2020, 2:12 PM

## 2020-09-23 ENCOUNTER — Other Ambulatory Visit (HOSPITAL_COMMUNITY): Payer: Self-pay | Admitting: Internal Medicine

## 2020-09-23 DIAGNOSIS — G4733 Obstructive sleep apnea (adult) (pediatric): Secondary | ICD-10-CM | POA: Diagnosis not present

## 2020-09-24 ENCOUNTER — Other Ambulatory Visit (HOSPITAL_COMMUNITY): Payer: Self-pay

## 2020-09-24 ENCOUNTER — Telehealth: Payer: Self-pay | Admitting: Podiatry

## 2020-09-24 ENCOUNTER — Other Ambulatory Visit: Payer: Medicaid Other | Admitting: Orthotics

## 2020-09-24 ENCOUNTER — Telehealth (HOSPITAL_COMMUNITY): Payer: Self-pay | Admitting: Licensed Clinical Social Worker

## 2020-09-24 DIAGNOSIS — Z634 Disappearance and death of family member: Secondary | ICD-10-CM | POA: Diagnosis not present

## 2020-09-24 NOTE — Telephone Encounter (Signed)
Pt had initial assessment with [facility] today for substance abuse rehab.  [facility] needs med list and diagnoses list to move forward- CSW sent to [facility] for review.  Will continue to follow and assist as needed  [name], LCSW Clinical Social Worker [facility] Desk#: [phone] Cell#: [phone]

## 2020-09-24 NOTE — Progress Notes (Signed)
Paramedicine Encounter    Patient ID: KHALEED HOLAN, male    DOB: November 07, 1965, 55 y.o.   MRN: [id number]   Patient Care Team: [name], NP as PCP - General (Nurse Practitioner) [name], MD as PCP - Cardiology (Cardiology) [name], LCSW as Social Worker (Licensed Clinical Social Worker)  Patient Active Problem List   Diagnosis Date Noted  . Polysubstance abuse ([place]) 08/20/2020  . Alcohol abuse 08/20/2020  . Atrial fibrillation with rapid ventricular response ([place]) 08/19/2020  . Obesity hypoventilation syndrome ([place]) 06/12/2020  . Healthcare maintenance 06/12/2020  . Acute on chronic diastolic heart failure ([place]) 02/24/2020  . Diabetic neuropathy ([place]) 11/20/2019  . Coagulation disorder ([place]) 11/20/2019  . Acute on chronic diastolic CHF (congestive heart failure), NYHA class 3 ([place]) 07/30/2019  . Diabetes mellitus without complication ([place]) 39/53/2023  . Hypertensive emergency   . BRBPR (bright red blood per rectum) 06/12/2019  . Hypertension   . CHF (congestive heart failure) ([place])   . Sleep apnea   . Chronic respiratory failure ([place])   . Hypokalemia 05/29/2019  . GI bleed 05/29/2019  . Anemia 05/28/2019  . Acute on chronic diastolic CHF (congestive heart failure) ([place]) 04/11/2019  . Iron deficiency anemia 03/11/2019  . OSA on CPAP 03/11/2019  . HLD (hyperlipidemia) 03/11/2019  . Elevated troponin 03/11/2019  . GERD (gastroesophageal reflux disease) 03/11/2019  . Rectal bleeding 02/20/2019  . Dyspnea 02/06/2019  . COVID-19 virus infection 02/06/2019  . Bilateral lower extremity edema   . Morbid obesity with BMI of 50.0-59.9, adult ([place])   . Chronic diastolic CHF (congestive heart failure) ([place])   . PAF (paroxysmal atrial fibrillation) ([place])   . PE (pulmonary thromboembolism) ([place]) 01/21/2019  . Hypertensive urgency 01/21/2019  . Diabetes mellitus type 2 in obese ([place]) 01/21/2019    Current Outpatient Medications:  .  atorvastatin (LIPITOR) 40 MG  tablet, Take 1 tablet (40 mg total) by mouth at bedtime., Disp: 90 tablet, Rfl: 2 .  Blood Glucose Monitoring Suppl (TRUE METRIX METER) w/Device KIT, Use as instructed. Check blood glucose level by fingerstick twice per day. (Patient taking differently: 1 each by Other route See admin instructions. Use as instructed. Check blood glucose level by fingerstick twice per day.), Disp: 1 kit, Rfl: 0 .  Blood Pressure Monitor DEVI, Please provide patient with insurance approved blood pressure device with L-XL cuff. BMI 55 (Patient taking differently: 1 each by Other route See admin instructions. Please provide patient with insurance approved blood pressure device with L-XL cuff. BMI 55), Disp: 1 each, Rfl: 0 .  carboxymethylcellulose (REFRESH PLUS) 0.5 % SOLN, Place 1 drop into both eyes daily., Disp: , Rfl:  .  diltiazem (CARDIZEM CD) 240 MG 24 hr capsule, Take 1 capsule (240 mg total) by mouth daily., Disp: 30 capsule, Rfl: 3 .  docusate sodium (COLACE) 100 MG capsule, Take 1 capsule (100 mg total) by mouth 2 (two) times daily., Disp: 180 capsule, Rfl: 2 .  doxazosin (CARDURA) 2 MG tablet, Take 1 tablet (2 mg total) by mouth daily., Disp: 30 tablet, Rfl: 11 .  glucose blood (TRUE METRIX BLOOD GLUCOSE TEST) test strip, Use as instructed. Check blood glucose level by fingerstick twice per day., Disp: 100 each, Rfl: 12 .  hydrALAZINE (APRESOLINE) 50 MG tablet, Take 1 tablet (50 mg total) by mouth 3 (three) times daily., Disp: 90 tablet, Rfl: 3 .  labetalol (NORMODYNE) 200 MG tablet, TAKE 2 TABLETS (400 MG TOTAL) BY MOUTH 2 (TWO) TIMES  DAILY., Disp: 120 tablet, Rfl: 3 .  magnesium oxide (MAG-OX) 400 (241.3 Mg) MG tablet, TAKE 2 TABLETS BY MOUTH EVERY MORNING AND 1 TABLET EVERY EVENING (AM+NOON+BEDTIME), Disp: 90 tablet, Rfl: 3 .  metFORMIN (GLUCOPHAGE) 500 MG tablet, TAKE ONE TABLET BY MOUTH TWICE DAILY WITH A MEAL (NOON+BEDTIME), Disp: 180 tablet, Rfl: 0 .  Misc. Devices MISC, Requires O2 @ 3L/min continuously  via nasal canula and home fill system,. (Patient taking differently: 1 each by Other route See admin instructions. Requires O2 @ 3L/min continuously via nasal canula and home fill system,.), Disp: 1 each, Rfl: 0 .  Misc. Devices MISC, Please provide Mr. Griep with insurance approved portable O2 concentrator ICD 10 J96.11 [phone] (Patient taking differently: 1 each by Other route See admin instructions. Please provide Mr. Crocket with insurance approved portable O2 concentrator ICD 10 J96.11 Z99.81), Disp: 1 each, Rfl: 0 .  Misc. Devices MISC, Please provide patient with XL sized insurance approved compression stockings. ICD 10 R60.0 and  I50.32, Disp: 1 each, Rfl: 0 .  omeprazole (PRILOSEC) 20 MG capsule, TAKE 1 CAPSULE BY MOUTH 2 (TWO) TIMES DAILY BEFORE A MEAL. TAKE FIRST CAPSULE 30 MIN PRIOR TO EATING OR TAKING OTHER MEDICATIONS. (AM+BEDTIME), Disp: 180 capsule, Rfl: 2 .  potassium chloride (KLOR-CON) 10 MEQ tablet, Take 6 tablets (60 mEq total) by mouth daily., Disp: 180 tablet, Rfl: 3 .  PROVENTIL HFA 108 (90 Base) MCG/ACT inhaler, INHALE 2 PUFFS BY MOUTH EVERY 6 (SIX) HOURS AS NEEDED FOR WHEEZING OR SHORTNESS OF BREATH., Disp: 18 g, Rfl: 6 .  spironolactone (ALDACTONE) 50 MG tablet, TAKE 1 TABLET (50 MG TOTAL) BY MOUTH DAILY (NOON), Disp: 30 tablet, Rfl: 3 .  torsemide (DEMADEX) 20 MG tablet, Take 58m by mouth twice daily, Disp: 180 tablet, Rfl: 3 .  TRUEplus Lancets 28G MISC, Use as instructed. Check blood glucose level by fingerstick twice per day., Disp: 200 each, Rfl: 3 .  XARELTO 20 MG TABS tablet, TAKE 1 TABLET BY MOUTH DAILY WITH SUPPER (BEDTIME) (Patient taking differently: Take 20 mg by mouth daily with supper.), Disp: 60 tablet, Rfl: 6 .  ferrous sulfate 325 (65 FE) MG tablet, Take 1 tablet (325 mg total) by mouth daily., Disp: 90 tablet, Rfl: 2 .  losartan (COZAAR) 100 MG tablet, Take 1 tablet (100 mg total) by mouth daily. (Patient not taking: Reported on 09/24/2020), Disp: 30 tablet,  Rfl: 11 .  methocarbamol (ROBAXIN) 500 MG tablet, Take 1 tablet (500 mg total) by mouth every 8 (eight) hours as needed for muscle spasms. (Patient not taking: Reported on 09/24/2020), Disp: 15 tablet, Rfl: 1 .  metolazone (ZAROXOLYN) 5 MG tablet, Take 1 tablet (5 mg total) by mouth See admin instructions. Take 1 tablet weekly till seen by cardiology (Patient not taking: Reported on 09/24/2020), Disp: 15 tablet, Rfl: 3 No Known Allergies    Social History   Socioeconomic History  . Marital status: Single    Spouse name: Not on file  . Number of children: Not on file  . Years of education: Not on file  . Highest education level: Not on file  Occupational History  . Not on file  Tobacco Use  . Smoking status: Former SResearch scientist (life sciences) . Smokeless tobacco: Never Used  . Tobacco comment: smoked weed in the past  Vaping Use  . Vaping Use: Never used  Substance and Sexual Activity  . Alcohol use: Not Currently  . Drug use: Not Currently    Types: Marijuana  .  Sexual activity: Not Currently  Other Topics Concern  . Not on file  Social History Narrative  . Not on file   Social Determinants of Health   Financial Resource Strain: Medium Risk  . Difficulty of Paying Living Expenses: Somewhat hard  Food Insecurity: Food Insecurity Present  . Worried About Charity fundraiser in the Last Year: Sometimes true  . Ran Out of Food in the Last Year: Sometimes true  Transportation Needs: No Transportation Needs  . Lack of Transportation (Medical): No  . Lack of Transportation (Non-Medical): No  Physical Activity: Not on file  Stress: Not on file  Social Connections: Not on file  Intimate Partner Violence: Not on file    Physical Exam      Future Appointments  Date Time Provider [place]  10/19/2020 11:30 AM [facility] PA/NP [facility] None  11/25/2020 10:15 AM Felipa Furnace, DPM [facility]  12/21/2020  9:50 AM [name], NP [facility] None    BP (!) 148/98   Pulse 76    Resp 20  Weight today-347 Weight yesterday-347 Last visit weight-352  [facility].  He said that they checked him over really well and his b/p was good. But then he said it was 170/110 and he hadnt taken med yet today. They started him on lamotrigine for depression by a brittany fritz. It is not in his bubble pack so he is going to have to take it separately.   He has been running errands a lot today and hasnt taken any meds all day. He is however in a very good mood. So that's pleasing. meds verified in bubble packs.  All is correct. Will f/u next week.   [name], [place] [facility] Paramedic  09/24/20

## 2020-09-24 NOTE — Telephone Encounter (Signed)
This is a CHF pt, Dr. Shirlee Latch sees pt.

## 2020-09-24 NOTE — Telephone Encounter (Signed)
Received message2  Days [facility] [place] [facility] in regards to pt getting diabetic shoes/inserts.  returned call and left message for Bein to call me back because she did not leave a pt dob or any other information other than pts name.  Bein returned call today and I explained that we are not able to do diabetic shoes for medicaid pt in our office but waiting on a prescription from Dr Allena Katz and I will send to pt with the locations that I know do them. [facility] and [facility] are the only 2 I am aware of.) then when pt see's one of them they will probably need documents signed by pcp. I explained this to pt last week but Bein was calling pt as well.

## 2020-09-25 NOTE — Telephone Encounter (Signed)
Left message for pt that the prescription is ready and to call to let me know if he wants to pick it up or if he would like it mailed to him.

## 2020-09-27 ENCOUNTER — Encounter: Payer: Self-pay | Admitting: Nurse Practitioner

## 2020-09-27 MED ORDER — MISC. DEVICES MISC: 0 refills | Status: DC

## 2020-09-29 ENCOUNTER — Other Ambulatory Visit (HOSPITAL_COMMUNITY): Payer: Medicaid Other

## 2020-09-29 ENCOUNTER — Telehealth (HOSPITAL_COMMUNITY): Payer: Self-pay | Admitting: *Deleted

## 2020-09-29 ENCOUNTER — Other Ambulatory Visit (HOSPITAL_COMMUNITY): Payer: Self-pay | Admitting: *Deleted

## 2020-09-29 DIAGNOSIS — I5032 Chronic diastolic (congestive) heart failure: Secondary | ICD-10-CM

## 2020-09-29 NOTE — Telephone Encounter (Signed)
Received a staff message from Kerry Hough w/paramedicine (see below).  Mr goethe called me today to report his weight is increased about 5-6lbs from last week and he is not putting out as much urine and his legs are swelling.     Per Amy Clegg,NP   "He needs another BMET. Last week renal function was elevated and torsemide was cut back.   So no change for now."  [facility] no answer. Message sent via staff message to [place].

## 2020-09-30 ENCOUNTER — Other Ambulatory Visit: Payer: Self-pay

## 2020-09-30 ENCOUNTER — Telehealth (HOSPITAL_COMMUNITY): Payer: Self-pay

## 2020-09-30 ENCOUNTER — Ambulatory Visit (HOSPITAL_COMMUNITY)
Admission: RE | Admit: 2020-09-30 | Discharge: 2020-09-30 | Disposition: A | Discharge status: Home / Self Care | Payer: Medicaid Other | Source: Ambulatory Visit | Attending: Cardiology | Admitting: Cardiology

## 2020-09-30 DIAGNOSIS — I5032 Chronic diastolic (congestive) heart failure: Secondary | ICD-10-CM | POA: Diagnosis not present

## 2020-09-30 LAB — BASIC METABOLIC PANEL
Anion gap: 9 (ref 5–15)
BUN: 13 mg/dL (ref 6–20)
CO2: 33 mmol/L — ABNORMAL HIGH (ref 22–32)
Calcium: 8.9 mg/dL (ref 8.9–10.3)
Chloride: 96 mmol/L — ABNORMAL LOW (ref 98–111)
Creatinine, Ser: 1.56 mg/dL — ABNORMAL HIGH (ref 0.61–1.24)
GFR, Estimated: 52 mL/min — ABNORMAL LOW (ref >60)
Glucose, Bld: 109 mg/dL — ABNORMAL HIGH (ref 70–99)
Potassium: 3.1 mmol/L — ABNORMAL LOW (ref 3.5–5.1)
Sodium: 138 mmol/L (ref 135–145)

## 2020-09-30 NOTE — Telephone Encounter (Signed)
Pt called me to let me know his sister got sick and he is on the way to [place] and wont be home for our visit today.  He did have labs today and also waiting on results for any changes from that.  He did mention he has missed a few days of his potassium dose.   Kerry Hough, EMT-Paramedic  09/30/20

## 2020-10-01 DIAGNOSIS — Z634 Disappearance and death of family member: Secondary | ICD-10-CM | POA: Diagnosis not present

## 2020-10-02 ENCOUNTER — Other Ambulatory Visit: Payer: Self-pay | Admitting: *Deleted

## 2020-10-02 NOTE — Patient Outreach (Signed)
Care Coordination  10/02/2020  AMALIO LOE [birth year] [id number]    Medicaid Managed Care   Unsuccessful Outreach Note  10/02/2020 Name: RICCARDO HOLEMAN MRN: [id number] DOB: [birth year]  Referred by: Claiborne Rigg, NP Reason for referral : High Risk Managed Medicaid (Unsuccessful initial outreach)   An unsuccessful telephone outreach was attempted today. The patient was referred to the case management team for assistance with care management and care coordination.   Follow Up Plan: The care management team will reach out to the patient again over the next 7-14 days.   Estanislado Emms RN, BSN [place]  [employer]

## 2020-10-02 NOTE — Patient Instructions (Signed)
Visit Information  [name]  - as a part of your Medicaid benefit, you are eligible for care management and care coordination services at no cost or copay. I was unable to reach you by phone today but would be happy to help you with your health related needs. Please feel free to call me @ [phone].   A member of the Managed Medicaid care management team will reach out to you again over the next 7-14 days.   Estanislado Emms RN, BSN [place]  [employer]

## 2020-10-07 ENCOUNTER — Telehealth (HOSPITAL_COMMUNITY): Payer: Self-pay

## 2020-10-07 NOTE — Telephone Encounter (Signed)
Pt texted me and advised he was still in [place] waiting on his ride to help him get back to [place]. Hope to see him tomor.    Kerry Hough, EMT-Paramedic  10/07/20

## 2020-10-08 ENCOUNTER — Other Ambulatory Visit (HOSPITAL_COMMUNITY): Payer: Self-pay

## 2020-10-08 DIAGNOSIS — Z634 Disappearance and death of family member: Secondary | ICD-10-CM | POA: Diagnosis not present
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
## 2020-10-09 ENCOUNTER — Telehealth (HOSPITAL_COMMUNITY): Payer: Self-pay | Admitting: Cardiology

## 2020-10-09 ENCOUNTER — Telehealth: Payer: Self-pay | Admitting: Pulmonary Disease

## 2020-10-09 ENCOUNTER — Ambulatory Visit: Payer: Self-pay

## 2020-10-09 ENCOUNTER — Telehealth (HOSPITAL_COMMUNITY): Payer: Self-pay | Admitting: Licensed Clinical Social Worker

## 2020-10-09 DIAGNOSIS — G4733 Obstructive sleep apnea (adult) (pediatric): Secondary | ICD-10-CM

## 2020-10-09 MED ORDER — CAPLYTA 42 MG PO CAPS: 42.0000 mg | ORAL_CAPSULE | Freq: Every day | ORAL | Status: DC

## 2020-10-09 MED ORDER — LAMOTRIGINE 25 MG PO TABS: 25.0000 mg | ORAL_TABLET | Freq: Two times a day (BID) | ORAL | Status: DC

## 2020-10-09 NOTE — Telephone Encounter (Signed)
Pt recent seen by physiatry and medications were started  [facility] updated

## 2020-10-09 NOTE — Telephone Encounter (Signed)
Spoke with [place]. She stated that the patient received a new cpap machine back in October 2021. The machine has been in the back of a friend's car for the past 2 months. He has been using his old cpap machine. Per [employer], insurance stopped paying for the machine on 01/05 and they will need to come and collect the cpap machine. If this happens, this will be the 2nd time this has happened to the patient.   Per Lucendia Herrlich is requiring another in-lab study for the patient in order for insurance to start paying for the machine again.   Dr. Craige Cotta, can you please advise? Thanks!

## 2020-10-09 NOTE — Telephone Encounter (Signed)
Pt called CSW and informed that his CPAP was in an acquaintances car trunk out of town for the past month.  He has been using his recalled CPAP machine in the meantime so didn't think to tell us but now [facility] has informed him his CPAP is being taken back because insurance has stopped paying due to noncompliance.  CSW called [employer] who confirms above and that there is no appeal process- insurance stopped paying for the equipment on Jan. 5th so they have to come get it back.  They will need a titration study and new order to get new equipment sent out to patient.  CSW called  Pulmonology and informed- they have sent a message to RN regarding pt needs and will follow up with CSW to help schedule.  Will continue to follow and assist as needed  [name], LCSW Clinical Social Worker [facility] Desk#: [phone] Cell#: [phone]

## 2020-10-11 DIAGNOSIS — J9611 Chronic respiratory failure with hypoxia: Secondary | ICD-10-CM | POA: Diagnosis not present

## 2020-10-11 DIAGNOSIS — I5032 Chronic diastolic (congestive) heart failure: Secondary | ICD-10-CM | POA: Diagnosis not present

## 2020-10-12 ENCOUNTER — Telehealth (HOSPITAL_COMMUNITY): Payer: Self-pay | Admitting: Licensed Clinical Social Worker

## 2020-10-12 NOTE — Telephone Encounter (Signed)
Please let him know that if he wishes to use a new CPAP machine, then he will need to have in lab titration study.  If he is agreeable to this, then please place order for CPAP titration study.  Titration study should be started on CPAP 12 cm H2O and titrated up as needed.  He is supposed to use 2 liters oxygen at night with CPAP, so please specify this in the order to assess for supplemental oxygen needs with CPAP.  Can use diagnosis codes for obstructive sleep apnea and obesity hypoventilation syndrome.

## 2020-10-12 NOTE — Telephone Encounter (Signed)
CSW received call from [facility] regarding pt waitlist status- she confirms they will be sending out letters this week for pt to remain on the waitlist- CSW messaged pt to inform and keep a look out for letter so we can turn it in ASAP.  Property Manger also stated that other properties we had been working with her on, [address] and [employer], had been sold to [employer]- she is unsure how this affects the patients place on their waitlist (was number 1 at [employer]).  CSW placed call and email to [employer] to discuss- awaiting return call.  [name], LCSW Clinical Social Worker [facility] Desk#: [phone] Cell#: [phone]

## 2020-10-12 NOTE — Telephone Encounter (Signed)
I called and spoke with the pt and notified him of response per VS  He verbalized understanding and was agreeable to CPAP titration  I have sent order to [facility] as requested per VS

## 2020-10-14 ENCOUNTER — Telehealth (HOSPITAL_COMMUNITY): Payer: Self-pay

## 2020-10-15 ENCOUNTER — Telehealth (HOSPITAL_COMMUNITY): Payer: Self-pay | Admitting: Licensed Clinical Social Worker

## 2020-10-15 ENCOUNTER — Other Ambulatory Visit (HOSPITAL_COMMUNITY): Payer: Self-pay

## 2020-10-15 NOTE — Progress Notes (Signed)
Paramedicine Encounter    Patient ID: Benjamin [name], male    DOB: [birth year], 55 y.o.   MRN: [id number]   Patient Care Team: [name], NP as PCP - General (Nurse Practitioner) [name], MD as PCP - Cardiology (Cardiology) [name], LCSW as Social Worker (Licensed Clinical Social Worker)  Patient Active Problem List   Diagnosis Date Noted  . Polysubstance abuse ([place]) 08/20/2020  . Alcohol abuse 08/20/2020  . Atrial fibrillation with rapid ventricular response ([place]) 08/19/2020  . Obesity hypoventilation syndrome ([place]) 06/12/2020  . Healthcare maintenance 06/12/2020  . Acute on chronic diastolic heart failure ([place]) 02/24/2020  . Diabetic neuropathy ([place]) 11/20/2019  . Coagulation disorder ([place]) 11/20/2019  . Acute on chronic diastolic CHF (congestive heart failure), NYHA class 3 ([place]) 07/30/2019  . Diabetes mellitus without complication ([place]) 26/33/3545  . Hypertensive emergency   . BRBPR (bright red blood per rectum) 06/12/2019  . Hypertension   . CHF (congestive heart failure) ([place])   . Sleep apnea   . Chronic respiratory failure ([place])   . Hypokalemia 05/29/2019  . GI bleed 05/29/2019  . Anemia 05/28/2019  . Acute on chronic diastolic CHF (congestive heart failure) ([place]) 04/11/2019  . Iron deficiency anemia 03/11/2019  . OSA on CPAP 03/11/2019  . HLD (hyperlipidemia) 03/11/2019  . Elevated troponin 03/11/2019  . GERD (gastroesophageal reflux disease) 03/11/2019  . Rectal bleeding 02/20/2019  . Dyspnea 02/06/2019  . COVID-19 virus infection 02/06/2019  . Bilateral lower extremity edema   . Morbid obesity with BMI of 50.0-59.9, adult ([place])   . Chronic diastolic CHF (congestive heart failure) ([place])   . PAF (paroxysmal atrial fibrillation) ([place])   . PE (pulmonary thromboembolism) ([place]) 01/21/2019  . Hypertensive urgency 01/21/2019  . Diabetes mellitus type 2 in obese ([place]) 01/21/2019    Current Outpatient Medications:  .  atorvastatin (LIPITOR) 40 MG  tablet, Take 1 tablet (40 mg total) by mouth at bedtime., Disp: 90 tablet, Rfl: 2 .  Blood Glucose Monitoring Suppl (TRUE METRIX METER) w/Device KIT, Use as instructed. Check blood glucose level by fingerstick twice per day. (Patient taking differently: 1 each by Other route See admin instructions. Use as instructed. Check blood glucose level by fingerstick twice per day.), Disp: 1 kit, Rfl: 0 .  Blood Pressure Monitor DEVI, Please provide patient with insurance approved blood pressure device with L-XL cuff. BMI 55 (Patient taking differently: 1 each by Other route See admin instructions. Please provide patient with insurance approved blood pressure device with L-XL cuff. BMI 55), Disp: 1 each, Rfl: 0 .  carboxymethylcellulose (REFRESH PLUS) 0.5 % SOLN, Place 1 drop into both eyes daily., Disp: , Rfl:  .  diltiazem (CARDIZEM CD) 240 MG 24 hr capsule, Take 1 capsule (240 mg total) by mouth daily., Disp: 30 capsule, Rfl: 3 .  docusate sodium (COLACE) 100 MG capsule, Take 1 capsule (100 mg total) by mouth 2 (two) times daily., Disp: 180 capsule, Rfl: 2 .  doxazosin (CARDURA) 2 MG tablet, Take 1 tablet (2 mg total) by mouth daily., Disp: 30 tablet, Rfl: 11 .  ferrous sulfate 325 (65 FE) MG tablet, Take 1 tablet (325 mg total) by mouth daily., Disp: 90 tablet, Rfl: 2 .  glucose blood (TRUE METRIX BLOOD GLUCOSE TEST) test strip, Use as instructed. Check blood glucose level by fingerstick twice per day., Disp: 100 each, Rfl: 12 .  hydrALAZINE (APRESOLINE) 50 MG tablet, Take 1 tablet (50 mg total) by mouth 3 (three) times daily.,  Disp: 90 tablet, Rfl: 3 .  labetalol (NORMODYNE) 200 MG tablet, Take 1 tablet (200 mg total) by mouth 2 (two) times daily., Disp: 60 tablet, Rfl: 0 .  lamoTRIgine (LAMICTAL) 25 MG tablet, Take 1 tablet (25 mg total) by mouth 2 (two) times daily., Disp: , Rfl:  .  losartan (COZAAR) 100 MG tablet, Take 1 tablet (100 mg total) by mouth daily. (Patient not taking: Reported on 09/24/2020),  Disp: 30 tablet, Rfl: 11 .  Lumateperone Tosylate (CAPLYTA) 42 MG CAPS, Take 42 mg by mouth at bedtime., Disp: 30 capsule, Rfl:  .  magnesium oxide (MAG-OX) 400 (241.3 Mg) MG tablet, TAKE 2 TABLETS BY MOUTH EVERY MORNING AND 1 TABLET EVERY EVENING (AM+NOON+BEDTIME), Disp: 90 tablet, Rfl: 3 .  metFORMIN (GLUCOPHAGE) 500 MG tablet, TAKE ONE TABLET BY MOUTH TWICE DAILY WITH A MEAL (NOON+BEDTIME), Disp: 180 tablet, Rfl: 0 .  methocarbamol (ROBAXIN) 500 MG tablet, Take 1 tablet (500 mg total) by mouth every 8 (eight) hours as needed for muscle spasms. (Patient not taking: Reported on 09/24/2020), Disp: 15 tablet, Rfl: 1 .  metolazone (ZAROXOLYN) 5 MG tablet, Take 1 tablet (5 mg total) by mouth See admin instructions. Take 1 tablet weekly till seen by cardiology (Patient not taking: Reported on 09/24/2020), Disp: 15 tablet, Rfl: 3 .  Misc. Devices MISC, Requires O2 @ 3L/min continuously via nasal canula and home fill system,. (Patient taking differently: 1 each by Other route See admin instructions. Requires O2 @ 3L/min continuously via nasal canula and home fill system,.), Disp: 1 each, Rfl: 0 .  Misc. Devices MISC, Please provide Mr. Huitron with insurance approved portable O2 concentrator ICD 10 J96.11 [phone] (Patient taking differently: 1 each by Other route See admin instructions. Please provide Mr. Alfrey with insurance approved portable O2 concentrator ICD 10 J96.11 Z99.81), Disp: 1 each, Rfl: 0 .  Misc. Devices MISC, Please provide patient with XL sized insurance approved compression stockings. ICD 10 R60.0 and  I50.32, Disp: 1 each, Rfl: 0 .  Misc. Devices MISC, Please provide patient with insurance approved right hand splint ICD 10 G56.01, Disp: 1 each, Rfl: 0 .  omeprazole (PRILOSEC) 20 MG capsule, TAKE 1 CAPSULE BY MOUTH 2 (TWO) TIMES DAILY BEFORE A MEAL. TAKE FIRST CAPSULE 30 MIN PRIOR TO EATING OR TAKING OTHER MEDICATIONS. (AM+BEDTIME), Disp: 180 capsule, Rfl: 2 .  potassium chloride (KLOR-CON) 10  MEQ tablet, Take 6 tablets (60 mEq total) by mouth daily., Disp: 180 tablet, Rfl: 3 .  PROVENTIL HFA 108 (90 Base) MCG/ACT inhaler, INHALE 2 PUFFS BY MOUTH EVERY 6 (SIX) HOURS AS NEEDED FOR WHEEZING OR SHORTNESS OF BREATH., Disp: 18 g, Rfl: 6 .  spironolactone (ALDACTONE) 50 MG tablet, TAKE 1 TABLET (50 MG TOTAL) BY MOUTH DAILY (NOON), Disp: 30 tablet, Rfl: 3 .  torsemide (DEMADEX) 20 MG tablet, Take 32m by mouth twice daily, Disp: 180 tablet, Rfl: 3 .  TRUEplus Lancets 28G MISC, Use as instructed. Check blood glucose level by fingerstick twice per day., Disp: 200 each, Rfl: 3 .  XARELTO 20 MG TABS tablet, TAKE 1 TABLET BY MOUTH DAILY WITH SUPPER (BEDTIME) (Patient taking differently: Take 20 mg by mouth daily with supper.), Disp: 60 tablet, Rfl: 6 No Known Allergies    Social History   Socioeconomic History  . Marital status: Single    Spouse name: Not on file  . Number of children: Not on file  . Years of education: Not on file  . Highest education level: Not on file  Occupational History  . Not on file  Tobacco Use  . Smoking status: Former Research scientist (life sciences)  . Smokeless tobacco: Never Used  . Tobacco comment: smoked weed in the past  Vaping Use  . Vaping Use: Never used  Substance and Sexual Activity  . Alcohol use: Not Currently  . Drug use: Not Currently    Types: Marijuana  . Sexual activity: Not Currently  Other Topics Concern  . Not on file  Social History Narrative  . Not on file   Social Determinants of Health   Financial Resource Strain: Medium Risk  . Difficulty of Paying Living Expenses: Somewhat hard  Food Insecurity: Food Insecurity Present  . Worried About Charity fundraiser in the Last Year: Sometimes true  . Ran Out of Food in the Last Year: Sometimes true  Transportation Needs: No Transportation Needs  . Lack of Transportation (Medical): No  . Lack of Transportation (Non-Medical): No  Physical Activity: Not on file  Stress: Not on file  Social Connections:  Not on file  Intimate Partner Violence: Not on file    Physical Exam      Future Appointments  Date Time Provider [place]  10/19/2020 11:30 AM [facility] PA/NP [facility] None  11/25/2020 10:15 AM Felipa Furnace, DPM [facility]  11/25/2020  8:00 PM [name], MD [facility]  12/21/2020  9:50 AM [name], NP [facility] None    BP (!) 150/88   Pulse 90   Resp 20   Wt (!) 353 lb (160.1 kg)   SpO2 98%   BMI 50.65 kg/m   Weight yesterday-356 Last visit weight-365  Pt reports he is doing better. He denies increased sob, no dizziness, no c/p, no h/a, no edema noted. His weight is back down now.  He left his pill box of potassium in [place]-I did p/u his potassium from pharmacy and gave him the bottle showing instructions to take 6 tablets daily.   [name], [place] [facility] Paramedic  10/15/20

## 2020-10-15 NOTE — Telephone Encounter (Signed)
Contacted pt regarding home visit for this week. He said he was at the gym and would call me once he got home.   Kerry Hough, EMT-Paramedic 10-14-2020

## 2020-10-15 NOTE — Telephone Encounter (Signed)
CSW called [employer] to discuss [address] apartments.  Was able to speak with representative that states their management company would no longer be responsible for property as of March 1st and to contact her again at that time to get contact information for new company.  CSW also check on pt CPAP titration study status- [place] has set up for 3/9- CSW will check in with pt prior to appt to ensure pt has transportation.  Will continue to follow and assist as needed  [name], LCSW Clinical Social Worker [facility] Desk#: [phone] Cell#: [phone]

## 2020-10-16 ENCOUNTER — Telehealth (HOSPITAL_COMMUNITY): Payer: Self-pay | Admitting: Licensed Clinical Social Worker

## 2020-10-16 DIAGNOSIS — Z634 Disappearance and death of family member: Secondary | ICD-10-CM | POA: Diagnosis not present

## 2020-10-16 NOTE — Telephone Encounter (Signed)
CSW checked with to make sure he had transportation set up for Monday to get to clinic appt.  Pt had forgotten about appt so had not set up transport yet.  Pt called [facility] [employer] and set up- no further needs at this time  Will continue to follow and assist as needed  [name], LCSW Clinical Social Worker [facility] Desk#: [phone] Cell#: [phone]

## 2020-10-16 NOTE — Progress Notes (Signed)
Advanced Heart Failure Clinic Note   Referring Physician: PCP: [name], NP PCP-Cardiologist: Dorris Carnes, MD  [facility]: Dr. Aundra Dubin   HPI: Mr.Williamsis a 55 yo with hx of diastolic CHF, HTN, DM, Atrial fib, PE in April 2020 (on Xarelto), OSA, GERD, chronic elevation of troponin.  Admitted 01/21/2019 with increased dyspnea and had PE. He was anticoagulated.   Patient re-admitted 5/20 for syncope/cough/dyspnea, COVID+, CT positive for bilateral GGOs, admitted to [facility] and discharged after 6 days, on O2. Rec'd Cards f/u.  Patient re-admitted 02/20/19 for bright red blood per rectum, eval'ed by GI, had EGD/colon, dx'd with hemorrhoidal bleeding, discharged after 9 days on O2.He was again Covid + .   Readmitted 03/11/19 with increased shortness of breath. He did not have home oxygen. He was discharged to home the next day.  He presented to [facility] ED7/23/20with lower extremity edema. COVID negative. Diuresed with lasix drip and transitioned to torsemide 40 mg twice daily. Had [place] with mild volume overload and preserved cardiac output. Discharge weight was 407.9 pounds.   He was readmitted with symptomatic anemia 05/28/19 with hgb 7.1. Received 1UPRBCs. GI consulted. He started on anusol and continued on stool softner.  He was readmitted to the hospital on 07/16/19 when he presented to the ED w/ worsening symptoms and was admitted by IM for a/c diastolic CHF and treated w/ IV Lasix. Had 2 day hospital stay and was discharged on 10/29. Echo was done 10/28 and showed normal LVEF 65-70%, RV interpreted as normal but suspect significant RV dysfunction.  After diuresis w/ IV Lasix, he was placed back on home diuretic regimen, torsemide 100 mg bid. Lisinopril 5 mg was also added to regimen. AHF team was not consulted that admit.  He was seen back in clinic 07/30/19. Felt poorly. SOB at rest and worse w/ exertion. Also w/ orthopnea/PND. Significant wt gain of 20 lb up to 428 lb (dry wt ~407 lb),  w/ marked abdominal distention and LEE and poor response to IV diuretics.  Once diuresed transitioned to torsemide 100 mg twice a day. D/C 408 pounds.   Zio patch on 10/20/19-No arrhythmia.   He was seen in clinic 10/21. Doing better. Still followed by paramedicine.Enrolled in a wellness program and exercises 2-3 days a week. Has modified diet and has lost ~70 lb. Fluid status ok on exam. No resting dyspnea. BP remains elevated. Paramedicine reports persistent, moderately elevated BP at home. Currently out of a CPAP device. He notes some occasional palpitations but EKG shows NSR. HR well controlled. Reports full compliance w/ Xarelto. No abnormal bleeding  Re-admitted 08/19/20-08/24/20 for rapid atrial fibrillation after being out of medications for 3 days. A. fib RVR in the setting of noncompliance ethanol and cocaine. Cardizem started and labetalol continued. 12/3 TEE (EF 60-65%) with DCCV successfully converted to SR. Diuresed with IV lasix and had AKI in setting of over diuresis. SCr 1.59 on admit, bumped to 2.5. Diuretics held + IVF hydration. SCr normalized at 1.14 on discharge.   He returned to clinic 12/21 for hospital follow up. Last used cocaine 3 weeks ago, consumed 1 beer since he was discharged. Breathing is OK, on 2-3L with activity, not very active. No CP, palpitations, edema, or PND/orthopnea. Wears cpap at night.  BPs at home ~118 with paramedicine. Weights at home ~354-355 lbs; down 7 lbs from discharge. Followed by paramedicine. No medication changes were made.  Today he returns for HF follow up. He had recent elevated kidney function and no longer on  losartan or weekly metolazone. Overall feeling fine except abdominal pain/swelling surrounding recent snake bite. [place] trying to catch snake while fishing, and snake bit his RLQ abdomen. Says it was all black snake, went to the ED and was told to apply antibiotic ointment. Having some swelling in legs. Denies increasing SOB, CP, dizziness,  edema, or PND/Orthopnea. Appetite ok. No fever or chills. Weight at home ~253 pounds. Taking all medications. Followed be Paramedicine.  Labs (11/20): BNP 32, K 3.6, creatinine 1.09  Labs (12/20): K 3.3, creatinine 1.28 Labs (10/10/19): K 3.9 Creatinine 1.4  Labs (11/05/19) : K 3.2 Creatinine 1.47  Labs (12/30/2019) : K 3.5 Creatinine 1.14  Labs (03/02/2020): K 3.8 Creatinine 1.09.  Labs (12/21): K 4.0, Creatinine 1.17, Magnesium 1.6 Labs (09/15/20): K 3.1, Creatinine 2.54 Labs (1/22): K 3.1, creatinine 1.56   Review of Systems: All systems reviewed and negative except as per HPI.   PMH: 1. Atrial fibrillation: Paroxysmal.  - DCCV (08/21/20): converted to SR 2. Pulmonary embolus: 5/20.  3. OHS/OSA: He is on home oxygen during the day and uses CPAP at night.  4. Morbid obesity.  5. Chronic diastolic CHF:  - RHC (7/11): mean RA 12, PA 40/25 mean 31, mean PCWP 23, CI 2.47, PVR 1.1 WU.  - Echo (10/20): EF 65-70%, mild LVH, normal RV size and systolic function.  - TEE (12/21): EF 60-65% 6. Type 2 diabetes 7. HTN 8. Rectal bleeding: ?Hemorrhoidal.  9. COVID-19 infection 7/20.  10. ZIo Patch - 10/20/19 no arrhythmias  NST 08/23/20  There was no ST segment deviation noted during stress.  The left ventricular ejection fraction is moderately decreased (30-44%).  Nuclear stress EF: 43%.  This is a low risk study.  The study is normal.  Fixed apical inferior perfusion defect with normal wall motion in this area, consistent with artifact  Current Outpatient Medications  Medication Sig Dispense Refill  . amoxicillin-clavulanate (AUGMENTIN) 875-125 MG tablet Take 1 tablet by mouth 2 (two) times daily. 14 tablet 0  . atorvastatin (LIPITOR) 40 MG tablet Take 1 tablet (40 mg total) by mouth at bedtime. 90 tablet 2  . Blood Glucose Monitoring Suppl (TRUE METRIX METER) w/Device KIT Use as instructed. Check blood glucose level by fingerstick twice per day. 1 kit 0  . Blood Pressure Monitor  DEVI Please provide patient with insurance approved blood pressure device with L-XL cuff. BMI 55 (Patient taking differently: 1 each by Other route See admin instructions. Please provide patient with insurance approved blood pressure device with L-XL cuff. BMI 55) 1 each 0  . carboxymethylcellulose (REFRESH PLUS) 0.5 % SOLN Place 1 drop into both eyes daily.    Marland Kitchen diltiazem (CARDIZEM CD) 240 MG 24 hr capsule Take 1 capsule (240 mg total) by mouth daily. 30 capsule 3  . docusate sodium (COLACE) 100 MG capsule Take 1 capsule (100 mg total) by mouth 2 (two) times daily. 180 capsule 2  . doxazosin (CARDURA) 2 MG tablet Take 1 tablet (2 mg total) by mouth daily. 30 tablet 11  . ferrous sulfate 325 (65 FE) MG tablet Take 1 tablet (325 mg total) by mouth daily. 90 tablet 2  . glucose blood (TRUE METRIX BLOOD GLUCOSE TEST) test strip Use as instructed. Check blood glucose level by fingerstick twice per day. 100 each 12  . hydrALAZINE (APRESOLINE) 50 MG tablet Take 1 tablet (50 mg total) by mouth 3 (three) times daily. 90 tablet 3  . labetalol (NORMODYNE) 200 MG tablet Take 1 tablet (200  mg total) by mouth 2 (two) times daily. 60 tablet 0  . losartan (COZAAR) 100 MG tablet Take 1 tablet (100 mg total) by mouth daily. 30 tablet 11  . magnesium oxide (MAG-OX) 400 (241.3 Mg) MG tablet TAKE 2 TABLETS BY MOUTH EVERY MORNING AND 1 TABLET EVERY EVENING (AM+NOON+BEDTIME) 90 tablet 3  . metFORMIN (GLUCOPHAGE) 500 MG tablet TAKE ONE TABLET BY MOUTH TWICE DAILY WITH A MEAL (NOON+BEDTIME) 180 tablet 0  . Misc. Devices MISC Requires O2 @ 3L/min continuously via nasal canula and home fill system,. (Patient taking differently: 1 each by Other route See admin instructions. Requires O2 @ 3L/min continuously via nasal canula and home fill system,.) 1 each 0  . Misc. Devices MISC Please provide Mr. Pichon with insurance approved portable O2 concentrator ICD 10 J96.11 [phone] (Patient taking differently: 1 each by Other route See  admin instructions. Please provide Mr. Klingbeil with insurance approved portable O2 concentrator ICD 10 J96.11 Z99.81) 1 each 0  . omeprazole (PRILOSEC) 20 MG capsule TAKE 1 CAPSULE BY MOUTH 2 (TWO) TIMES DAILY BEFORE A MEAL. TAKE FIRST CAPSULE 30 MIN PRIOR TO EATING OR TAKING OTHER MEDICATIONS. (AM+BEDTIME) 180 capsule 2  . potassium chloride (KLOR-CON) 10 MEQ tablet Take 6 tablets (60 mEq total) by mouth daily. 180 tablet 3  . PROVENTIL HFA 108 (90 Base) MCG/ACT inhaler INHALE 2 PUFFS BY MOUTH EVERY 6 (SIX) HOURS AS NEEDED FOR WHEEZING OR SHORTNESS OF BREATH. 18 g 6  . spironolactone (ALDACTONE) 50 MG tablet TAKE 1 TABLET (50 MG TOTAL) BY MOUTH DAILY (NOON) 30 tablet 3  . torsemide (DEMADEX) 20 MG tablet Take 62m by mouth twice daily 180 tablet 3  . TRUEplus Lancets 28G MISC Use as instructed. Check blood glucose level by fingerstick twice per day. 200 each 3  . XARELTO 20 MG TABS tablet TAKE 1 TABLET BY MOUTH DAILY WITH SUPPER (BEDTIME) 60 tablet 6  . Lumateperone Tosylate (CAPLYTA) 42 MG CAPS Take 42 mg by mouth at bedtime. 30 capsule    No current facility-administered medications for this encounter.    No Known Allergies    Social History   Socioeconomic History  . Marital status: Single    Spouse name: Not on file  . Number of children: Not on file  . Years of education: Not on file  . Highest education level: Not on file  Occupational History  . Not on file  Tobacco Use  . Smoking status: Former SResearch scientist (life sciences) . Smokeless tobacco: Never Used  . Tobacco comment: smoked weed in the past  Vaping Use  . Vaping Use: Never used  Substance and Sexual Activity  . Alcohol use: Not Currently  . Drug use: Not Currently    Types: Marijuana  . Sexual activity: Not Currently  Other Topics Concern  . Not on file  Social History Narrative  . Not on file   Social Determinants of Health   Financial Resource Strain: Medium Risk  . Difficulty of Paying Living Expenses: Somewhat hard  Food  Insecurity: Food Insecurity Present  . Worried About RCharity fundraiserin the Last Year: Sometimes true  . Ran Out of Food in the Last Year: Sometimes true  Transportation Needs: No Transportation Needs  . Lack of Transportation (Medical): No  . Lack of Transportation (Non-Medical): No  Physical Activity: Not on file  Stress: Not on file  Social Connections: Not on file  Intimate Partner Violence: Not on file     Family History  Problem Relation Age of Onset  . Hypertension Mother   . Diabetes Mother     Vitals:   10/19/20 1122  BP: (!) 152/90  Pulse: 90  SpO2: 95%  Weight: (!) 160.5 kg (353 lb 12.8 oz)   Wt Readings from Last 3 Encounters:  10/19/20 (!) 160.5 kg (353 lb 12.8 oz)  10/15/20 (!) 160.1 kg (353 lb)  10/08/20 (!) 165.6 kg (365 lb)   PHYSICAL EXAM: General:  Obese male. No respiratory difficulty HEENT: normal Neck: supple. Thick neck, no JVD. Carotids 2+ bilat; no bruits. No lymphadenopathy or thyromegaly appreciated. Cor: PMI nondisplaced. Regular rate & rhythm. No rubs, gallops or murmurs. Lungs: clear Abdomen: obese, hardened erythematous circumscribed area to RLQ, + tenderness + warm to touch. nondistended. No hepatosplenomegaly. No bruits or masses. Good bowel sounds. Extremities: no cyanosis, clubbing, rash, edema, chronic venous stasis dermatitis bilaterally. +Phalen's & Tinel's on right  Neuro: alert & oriented x 3, cranial nerves grossly intact. moves all 4 extremities w/o difficulty. Affect pleasant.  ASSESSMENT & PLAN:  1. Chronic diastolic CHF: Echo in 46/28 with EF 65-70%, RV interpreted as normal but suspect significant RV dysfunction.  -NYHA II-III, suspect obesity is a contributing factor. Volume status stable.  - Continue torsemide 60 mg bid (previously on 100 mg bid + weekly metolazone; stopped due to worsening kidney function). - Continue KCl 60 mEq daily. - Continue spironolactone to 50 mg daily.  - Losartan stopped (elevated SCr). -  Weekly metolazone stopped. - BMET today.  2. Atrial fibrillation: Paroxysmal. Regular on exam. - Denies abnormal bleeding.  - Continue Xarelto.  - CBC today.  3. H/o PE: 5/20, continue Xarelto.   4. OHS/OSA: Has new replacement part for CPAP now. - Continue home O2. - Continue using CPAP nightly.   5. HTN: elevated today. Better control at home. - Hydralazine increased recently to 50 mg tid. - Continue diltiazem 240 mg daily. - Continue doxazosin 2 mg daily. - Continue labetalol 400 mg bid.  6. DM - Per PCP. - hgb a1c 6.1 (12/21).  7. Obesity Body mass index is 50.77 kg/m.  - Has lost ~70 lb through wellness program. - Congratulated on efforts and encouraged to continue on current plan.  - Encouraged continued dietary changes and daily exercise to aid with continued weight loss efforts.  8. Abdominal cellulitis - Presumably from recent snake bite, no fever/chills/prululent discharge. - Last Tdap 2020. - Start Augmentin 1 tab bid x 7 days. - Strict instructions not to open wound with knife (patient asked if he could drain it). - Needs to be followed by PCP closely. - CBC today.  Follow up in 3 months with Dr. Aundra Dubin. Continue HF Paramedicine. Follow up with PCP asap about snake bite.  Rafael Bihari, DNP, FNP-BC 10/19/2020 1:45 PM

## 2020-10-19 ENCOUNTER — Other Ambulatory Visit (HOSPITAL_COMMUNITY): Payer: Self-pay

## 2020-10-19 ENCOUNTER — Other Ambulatory Visit: Payer: Self-pay

## 2020-10-19 ENCOUNTER — Ambulatory Visit (HOSPITAL_COMMUNITY)
Admission: RE | Admit: 2020-10-19 | Discharge: 2020-10-19 | Disposition: A | Discharge status: Home / Self Care | Payer: Medicaid Other | Source: Ambulatory Visit | Attending: Family Medicine | Admitting: Family Medicine

## 2020-10-19 ENCOUNTER — Encounter (HOSPITAL_COMMUNITY): Payer: Self-pay

## 2020-10-19 VITALS — BP 152/90 | HR 90 | Wt 353.8 lb

## 2020-10-19 DIAGNOSIS — E119 Type 2 diabetes mellitus without complications: Secondary | ICD-10-CM | POA: Insufficient documentation

## 2020-10-19 DIAGNOSIS — Z7901 Long term (current) use of anticoagulants: Secondary | ICD-10-CM | POA: Insufficient documentation

## 2020-10-19 DIAGNOSIS — I110 Hypertensive heart disease with heart failure: Secondary | ICD-10-CM | POA: Insufficient documentation

## 2020-10-19 DIAGNOSIS — I2699 Other pulmonary embolism without acute cor pulmonale: Secondary | ICD-10-CM | POA: Diagnosis not present

## 2020-10-19 DIAGNOSIS — G4733 Obstructive sleep apnea (adult) (pediatric): Secondary | ICD-10-CM | POA: Diagnosis not present

## 2020-10-19 DIAGNOSIS — Z7984 Long term (current) use of oral hypoglycemic drugs: Secondary | ICD-10-CM | POA: Diagnosis not present

## 2020-10-19 DIAGNOSIS — L03311 Cellulitis of abdominal wall: Secondary | ICD-10-CM | POA: Insufficient documentation

## 2020-10-19 DIAGNOSIS — Z86711 Personal history of pulmonary embolism: Secondary | ICD-10-CM | POA: Diagnosis not present

## 2020-10-19 DIAGNOSIS — Z87891 Personal history of nicotine dependence: Secondary | ICD-10-CM | POA: Insufficient documentation

## 2020-10-19 DIAGNOSIS — Z8616 Personal history of COVID-19: Secondary | ICD-10-CM | POA: Diagnosis not present

## 2020-10-19 DIAGNOSIS — Z9119 Patient's noncompliance with other medical treatment and regimen: Secondary | ICD-10-CM | POA: Diagnosis not present

## 2020-10-19 DIAGNOSIS — Z9989 Dependence on other enabling machines and devices: Secondary | ICD-10-CM

## 2020-10-19 DIAGNOSIS — Z6841 Body Mass Index (BMI) 40.0 and over, adult: Secondary | ICD-10-CM | POA: Insufficient documentation

## 2020-10-19 DIAGNOSIS — I480 Paroxysmal atrial fibrillation: Secondary | ICD-10-CM | POA: Insufficient documentation

## 2020-10-19 DIAGNOSIS — I10 Essential (primary) hypertension: Secondary | ICD-10-CM

## 2020-10-19 DIAGNOSIS — Z79899 Other long term (current) drug therapy: Secondary | ICD-10-CM | POA: Diagnosis not present

## 2020-10-19 DIAGNOSIS — I5032 Chronic diastolic (congestive) heart failure: Secondary | ICD-10-CM | POA: Insufficient documentation

## 2020-10-19 DIAGNOSIS — E118 Type 2 diabetes mellitus with unspecified complications: Secondary | ICD-10-CM | POA: Diagnosis not present

## 2020-10-19 DIAGNOSIS — L03319 Cellulitis of trunk, unspecified: Secondary | ICD-10-CM

## 2020-10-19 LAB — CBC
HCT: 39 % (ref 39.0–52.0)
Hemoglobin: 12.5 g/dL — ABNORMAL LOW (ref 13.0–17.0)
MCH: 24.9 pg — ABNORMAL LOW (ref 26.0–34.0)
MCHC: 32.1 g/dL (ref 30.0–36.0)
MCV: 77.7 fL — ABNORMAL LOW (ref 80.0–100.0)
Platelets: 371 10*3/uL (ref 150–400)
RBC: 5.02 MIL/uL (ref 4.22–5.81)
RDW: 17.2 % — ABNORMAL HIGH (ref 11.5–15.5)
WBC: 9.3 10*3/uL (ref 4.0–10.5)
nRBC: 0 % (ref 0.0–0.2)

## 2020-10-19 LAB — BASIC METABOLIC PANEL
Anion gap: 10 (ref 5–15)
BUN: 13 mg/dL (ref 6–20)
CO2: 30 mmol/L (ref 22–32)
Calcium: 8.8 mg/dL — ABNORMAL LOW (ref 8.9–10.3)
Chloride: 98 mmol/L (ref 98–111)
Creatinine, Ser: 1.31 mg/dL — ABNORMAL HIGH (ref 0.61–1.24)
GFR, Estimated: >60 mL/min (ref >60)
Glucose, Bld: 91 mg/dL (ref 70–99)
Potassium: 3.5 mmol/L (ref 3.5–5.1)
Sodium: 138 mmol/L (ref 135–145)

## 2020-10-19 MED ORDER — AMOXICILLIN-POT CLAVULANATE 875-125 MG PO TABS: 1.0000 | ORAL_TABLET | Freq: Two times a day (BID) | ORAL | 0 refills | Status: DC

## 2020-10-19 NOTE — Progress Notes (Signed)
Paramedicine Encounter   Patient ID: Benjamin [name] , male,   DOB: April 19, 1966,54 y.o.,  MRN: [id number]   Met patient in clinic today with provider.  Weight @ clinic-353 B/p-152/90 p-90 sp02-95  Pt reports he was out fishing a few days ago and he said he tripped and fell and a snake bit him on the side. He is reporting he has a knot to his rt side and his rt side feels burning. He does have redness to the area. He states that the lamictal caused him many rash spots to his legs/groin area. So he has stopped that med. He has not started that other new med that his psych doc prescribed but has not started taking it.  He forgot the apartment papers at home but he said he would bring them back to [name] today.   She is putting him on antibiotic.  --needs to f/u with PCP about low urine output to check for prostate issues Labs today.   [name], [place] 10/19/2020

## 2020-10-19 NOTE — Progress Notes (Signed)
CSW sat in with patient during appt to assist as needed.  Pt with reported snake bite to abdomen- being started on antibiotics today but needs follow up next week with PCP to check on progress.  CSW sent message to [facility] at PCP office- they are trying to get pt in with one of their offices next week- awaiting appt.  Pt also had waitlist letter arrive for [facility] apartments- CSW helped pt fill out and he is having his friend drive him there today to turn in.  Will continue to follow and assist as needed  [name], LCSW Clinical Social Worker Advanced Heart Failure Clinic Desk#: [phone] Cell#: [phone]

## 2020-10-19 NOTE — Patient Instructions (Addendum)
START Augmentin 1 tablet twice a day for 7 days  Labs done today, your results will be available in MyChart, we will contact you for abnormal readings.  Your physician recommends that you schedule a follow-up appointment in: 3 months  If you have any questions or concerns before your next appointment please send [place] a message through [place] or call our office at [phone].    TO LEAVE A MESSAGE FOR THE NURSE SELECT OPTION 2, PLEASE LEAVE A MESSAGE INCLUDING: . YOUR NAME . DATE OF BIRTH . CALL BACK NUMBER . REASON FOR CALL**this is important as we prioritize the call backs  YOU WILL RECEIVE A CALL BACK THE SAME DAY AS LONG AS YOU CALL BEFORE 4:00 PM

## 2020-10-20 ENCOUNTER — Telehealth (HOSPITAL_COMMUNITY): Payer: Self-pay

## 2020-10-20 DIAGNOSIS — I10 Essential (primary) hypertension: Secondary | ICD-10-CM

## 2020-10-20 DIAGNOSIS — E876 Hypokalemia: Secondary | ICD-10-CM

## 2020-10-20 MED ORDER — POTASSIUM CHLORIDE ER 10 MEQ PO TBCR: 80.0000 meq | EXTENDED_RELEASE_TABLET | Freq: Every day | ORAL | 3 refills | Status: DC

## 2020-10-20 NOTE — Telephone Encounter (Signed)
I called pt to see if he was able to p/u his antibiotic that got prescribed yesterday. He has not yet but he is actually in the area as we speak so I guided him on where to go.  So he was able to get it and also advised him the clinic has been trying to reach him regarding the potassium and he needs to increase it to daily until he gets labs checked again.  He agreed to plan.   Kerry Hough, EMT-Paramedic  10/20/20

## 2020-10-20 NOTE — Telephone Encounter (Signed)
-----   Message from Jacklynn Ganong, [place] sent at 10/19/2020  1:32 PM EST ----- Potassium low/normal. Is he taking his KCl 60 mEq daily? If so, increase to 80 mEq KCl daily with repeat BMET in next 1-2 weeks. No white count suggesting current infection.

## 2020-10-20 NOTE — Telephone Encounter (Signed)
Samara Snide, RN  10/20/2020 3:20 PM EST Back to Top     Paramedicine will advise patient. Med list updated   Samara Snide, RN  10/20/2020 9:24 AM EST      Tried calling patient, no answer, unable to leave message due to mailbox being full. Will try again later   Samara Snide, RN  10/19/2020 2:55 PM EST      Tried calling patient, no answer, unable to leave message. Will try again later

## 2020-10-21 ENCOUNTER — Other Ambulatory Visit: Payer: Self-pay | Admitting: Nurse Practitioner

## 2020-10-21 MED ORDER — MISC. DEVICES MISC: 0 refills | Status: DC

## 2020-10-22 DIAGNOSIS — Z634 Disappearance and death of family member: Secondary | ICD-10-CM | POA: Diagnosis not present

## 2020-10-24 DIAGNOSIS — I5032 Chronic diastolic (congestive) heart failure: Secondary | ICD-10-CM | POA: Diagnosis not present

## 2020-10-24 DIAGNOSIS — J9611 Chronic respiratory failure with hypoxia: Secondary | ICD-10-CM | POA: Diagnosis not present

## 2020-10-26 ENCOUNTER — Telehealth (HOSPITAL_COMMUNITY): Payer: Self-pay

## 2020-10-26 ENCOUNTER — Emergency Department (HOSPITAL_COMMUNITY): Payer: Medicaid Other

## 2020-10-26 ENCOUNTER — Other Ambulatory Visit: Payer: Self-pay

## 2020-10-26 ENCOUNTER — Telehealth: Payer: Self-pay

## 2020-10-26 ENCOUNTER — Encounter (HOSPITAL_COMMUNITY): Payer: Self-pay | Admitting: Pediatrics

## 2020-10-26 ENCOUNTER — Emergency Department (HOSPITAL_COMMUNITY)
Admission: EM | Admit: 2020-10-26 | Discharge: 2020-10-26 | Disposition: A | Discharge status: Home / Self Care | Payer: Medicaid Other | Attending: Emergency Medicine | Admitting: Emergency Medicine

## 2020-10-26 DIAGNOSIS — Z5321 Procedure and treatment not carried out due to patient leaving prior to being seen by health care provider: Secondary | ICD-10-CM | POA: Diagnosis not present

## 2020-10-26 DIAGNOSIS — R000 Tachycardia, unspecified: Secondary | ICD-10-CM | POA: Diagnosis not present

## 2020-10-26 DIAGNOSIS — A419 Sepsis, unspecified organism: Secondary | ICD-10-CM | POA: Diagnosis not present

## 2020-10-26 DIAGNOSIS — L089 Local infection of the skin and subcutaneous tissue, unspecified: Secondary | ICD-10-CM | POA: Insufficient documentation

## 2020-10-26 DIAGNOSIS — R52 Pain, unspecified: Secondary | ICD-10-CM | POA: Diagnosis not present

## 2020-10-26 DIAGNOSIS — I509 Heart failure, unspecified: Secondary | ICD-10-CM | POA: Diagnosis not present

## 2020-10-26 DIAGNOSIS — I10 Essential (primary) hypertension: Secondary | ICD-10-CM | POA: Diagnosis not present

## 2020-10-26 LAB — LACTIC ACID, PLASMA: Lactic Acid, Venous: 1.3 mmol/L (ref 0.5–1.9)

## 2020-10-26 LAB — CBC WITH DIFFERENTIAL/PLATELET
Abs Immature Granulocytes: 0.05 10*3/uL (ref 0.00–0.07)
Basophils Absolute: 0 10*3/uL (ref 0.0–0.1)
Basophils Relative: 0 %
Eosinophils Absolute: 0.5 10*3/uL (ref 0.0–0.5)
Eosinophils Relative: 5 %
HCT: 39.9 % (ref 39.0–52.0)
Hemoglobin: 12.6 g/dL — ABNORMAL LOW (ref 13.0–17.0)
Immature Granulocytes: 1 %
Lymphocytes Relative: 19 %
Lymphs Abs: 1.8 10*3/uL (ref 0.7–4.0)
MCH: 25 pg — ABNORMAL LOW (ref 26.0–34.0)
MCHC: 31.6 g/dL (ref 30.0–36.0)
MCV: 79.2 fL — ABNORMAL LOW (ref 80.0–100.0)
Monocytes Absolute: 0.6 10*3/uL (ref 0.1–1.0)
Monocytes Relative: 7 %
Neutro Abs: 6.6 10*3/uL (ref 1.7–7.7)
Neutrophils Relative %: 68 %
Platelets: 451 10*3/uL — ABNORMAL HIGH (ref 150–400)
RBC: 5.04 MIL/uL (ref 4.22–5.81)
RDW: 17 % — ABNORMAL HIGH (ref 11.5–15.5)
WBC: 9.6 10*3/uL (ref 4.0–10.5)
nRBC: 0 % (ref 0.0–0.2)

## 2020-10-26 LAB — COMPREHENSIVE METABOLIC PANEL
ALT: 19 U/L (ref 0–44)
AST: 18 U/L (ref 15–41)
Albumin: 3.1 g/dL — ABNORMAL LOW (ref 3.5–5.0)
Alkaline Phosphatase: 51 U/L (ref 38–126)
Anion gap: 9 (ref 5–15)
BUN: 13 mg/dL (ref 6–20)
CO2: 30 mmol/L (ref 22–32)
Calcium: 9 mg/dL (ref 8.9–10.3)
Chloride: 99 mmol/L (ref 98–111)
Creatinine, Ser: 1.29 mg/dL — ABNORMAL HIGH (ref 0.61–1.24)
GFR, Estimated: >60 mL/min (ref >60)
Glucose, Bld: 149 mg/dL — ABNORMAL HIGH (ref 70–99)
Potassium: 3.8 mmol/L (ref 3.5–5.1)
Sodium: 138 mmol/L (ref 135–145)
Total Bilirubin: 0.4 mg/dL (ref 0.3–1.2)
Total Protein: 7.5 g/dL (ref 6.5–8.1)

## 2020-10-26 LAB — PROTIME-INR
INR: 1.1 (ref 0.8–1.2)
Prothrombin Time: 13.7 seconds (ref 11.4–15.2)

## 2020-10-26 MED ORDER — MISC. DEVICES MISC: 0 refills | Status: DC

## 2020-10-26 NOTE — ED Notes (Signed)
No answer multple times

## 2020-10-26 NOTE — ED Triage Notes (Signed)
Reported snake bite 1 1/2 week ago and completed oral antibiotics; and concern for increasing drainage on site on right abdominal area. EMS endorsed hx of Afib w/ prior convertion. EMS concern for Sepsis

## 2020-10-26 NOTE — ED Notes (Signed)
Patient called x3 with no response for vitals recheck

## 2020-10-26 NOTE — Telephone Encounter (Signed)
Pt contacted me this morning regarding his snake bite site.  He reports all wknd there has been a lot of drainage from the site and now it is bleeding badly, he is on xarelto so I advised him that it possibly could take some medical intervention to stop that bleeding. I advised him to go to hosp. He is almost done with the course of antibiotics, I advised him that he may need more antibiotics. He told me his girlfriend is going to take him to hosp.  I told them to go to ER, proceed with lots of caution as the roads are icy and slick.   Kerry Hough, EMT-Paramedic  10/26/20

## 2020-10-26 NOTE — ED Notes (Signed)
Pt called X2 for vitals recheck. Pt could not be found.  

## 2020-10-26 NOTE — Telephone Encounter (Signed)
-----   Message from Robyne Peers, RN sent at 10/20/2020  3:41 PM EST ----- Regarding: hand splint Benjamin [name], EMT is asking about a hand splint for the patient.  [name] never received an order.

## 2020-10-27 ENCOUNTER — Telehealth (HOSPITAL_COMMUNITY): Payer: Self-pay | Admitting: Licensed Clinical Social Worker

## 2020-10-27 DIAGNOSIS — Z634 Disappearance and death of family member: Secondary | ICD-10-CM | POA: Diagnosis not present

## 2020-10-27 NOTE — Telephone Encounter (Signed)
Pt left ED yesterday prior to being seen so CSW checked in this morning to see how he is doing.  Pt reports he is feeling much better and doesn't have pain or active discharge from his wound site.  States wound was cleaned in ED during evaluation and whatever they put on it helped.  Pt reports he is still taking antibiotics.  CSW reminded pt of appt with [facility]W on Monday to check on wound status and need for him to call Medicaid transport to set up ride.  Pt acknowledged and will plan to call for transport today.  Will continue to follow and assist as needed  [name], LCSW Clinical Social Worker Advanced [facility] Failure Clinic Desk#: [phone] Cell#: [phone]

## 2020-10-28 ENCOUNTER — Other Ambulatory Visit: Payer: Self-pay

## 2020-10-28 ENCOUNTER — Ambulatory Visit (HOSPITAL_COMMUNITY)
Admission: EM | Admit: 2020-10-28 | Discharge: 2020-10-28 | Disposition: A | Discharge status: Home / Self Care | Payer: Medicaid Other | Attending: Urgent Care | Admitting: Urgent Care

## 2020-10-28 ENCOUNTER — Telehealth (HOSPITAL_COMMUNITY): Payer: Self-pay | Admitting: Licensed Clinical Social Worker

## 2020-10-28 ENCOUNTER — Encounter (HOSPITAL_COMMUNITY): Payer: Self-pay | Admitting: *Deleted

## 2020-10-28 DIAGNOSIS — R030 Elevated blood-pressure reading, without diagnosis of hypertension: Secondary | ICD-10-CM

## 2020-10-28 DIAGNOSIS — Z5189 Encounter for other specified aftercare: Secondary | ICD-10-CM | POA: Diagnosis not present

## 2020-10-28 DIAGNOSIS — I10 Essential (primary) hypertension: Secondary | ICD-10-CM

## 2020-10-28 NOTE — Discharge Instructions (Signed)
Follow up with one of the wound care clinics for your resolving snake bite. Make sure you take your blood pressure medications and if you start to have chest pain, headache, confusion or weakness call 911 or have a calm friend or family member take you to the emergency room.

## 2020-10-28 NOTE — ED Triage Notes (Signed)
Pt reports he has an open wound to his RT lower Abd.  . Pt reports a snake bite over a month ago and has been treated by his Dr with anti-Bx. Pt reports the site has improved and redness is gone. Pt wants the wound checked today. Wound has a tan base with tan drainage.

## 2020-10-28 NOTE — Telephone Encounter (Signed)
CSW received call from pt requesting CSW reach out to [facility] to get information regarding a new apartment site opening up so we could apply ASAP and get the pt to the top of the list.  CSW called [facility] and left VM.  CSW inquired about state of pt snake bite and pt sent CSW pictures to look at.  Pictures showed that there was severe skin break down resulting in a hole in the pts abdomen and what looked like pus.  CSW showed to APP who recommends pt getting this evaluated immediately in ED.  Pt hesitant to go to ED after waiting 9 hours the other day and getting no where near being seen.    APP also suggested getting order for pt to be evaluated at [facility] for potential surgery consult as she believes site will need surgery.  PCP office called to request order- they recommend pt going to urgent care or ED.  CSW called pt back and requested he at least go to urgent care for eval since wait time listed as only being 15 minutes at this time.  Pt will go there immediately- CSW sent urgent care notice of pt imminent arrival with details regarding his concerns.  Will continue to follow and assist as needed  [name], LCSW Clinical Social Worker Advanced [facility] Failure Clinic Desk#: [phone] Cell#: [phone]

## 2020-10-28 NOTE — ED Provider Notes (Signed)
Benjamin [name]   MRN: [id number] DOB: [birth year]  Subjective:   Benjamin [name] is a 55 y.o. male presenting for wound check.  He suffered a snakebite about a month ago, refer to previous documentation for more details.  Has completed course of Augmentin at the end of January.  Is supposed to follow-up with his PCP but has not done so yet.  His primary concern today is to make sure he is evaluated for infection and need for more antibiotics.  Regarding his blood pressure, states that he did not take any of his medications today.  Denies fever, headache, confusion, weakness, chest pain, heart racing, shortness of breath, belly pain.  Denies that his wound has been draining for bleeding.  He has kept his wound covered and cleans it every day.  No current facility-administered medications for this encounter.  Current Outpatient Medications:  .  amoxicillin-clavulanate (AUGMENTIN) 875-125 MG tablet, Take 1 tablet by mouth 2 (two) times daily., Disp: 14 tablet, Rfl: 0 .  atorvastatin (LIPITOR) 40 MG tablet, Take 1 tablet (40 mg total) by mouth at bedtime., Disp: 90 tablet, Rfl: 2 .  Blood Glucose Monitoring Suppl (TRUE METRIX METER) w/Device KIT, Use as instructed. Check blood glucose level by fingerstick twice per day., Disp: 1 kit, Rfl: 0 .  Blood Pressure Monitor DEVI, Please provide patient with insurance approved blood pressure device with L-XL cuff. BMI 55 (Patient taking differently: 1 each by Other route See admin instructions. Please provide patient with insurance approved blood pressure device with L-XL cuff. BMI 55), Disp: 1 each, Rfl: 0 .  carboxymethylcellulose (REFRESH PLUS) 0.5 % SOLN, Place 1 drop into both eyes daily., Disp: , Rfl:  .  diltiazem (CARDIZEM CD) 240 MG 24 hr capsule, Take 1 capsule (240 mg total) by mouth daily., Disp: 30 capsule, Rfl: 3 .  docusate sodium (COLACE) 100 MG capsule, Take 1 capsule (100 mg total) by mouth 2 (two) times daily., Disp: 180  capsule, Rfl: 2 .  doxazosin (CARDURA) 2 MG tablet, Take 1 tablet (2 mg total) by mouth daily., Disp: 30 tablet, Rfl: 11 .  ferrous sulfate 325 (65 FE) MG tablet, Take 1 tablet (325 mg total) by mouth daily., Disp: 90 tablet, Rfl: 2 .  glucose blood (TRUE METRIX BLOOD GLUCOSE TEST) test strip, Use as instructed. Check blood glucose level by fingerstick twice per day., Disp: 100 each, Rfl: 12 .  hydrALAZINE (APRESOLINE) 50 MG tablet, Take 1 tablet (50 mg total) by mouth 3 (three) times daily., Disp: 90 tablet, Rfl: 3 .  labetalol (NORMODYNE) 200 MG tablet, Take 1 tablet (200 mg total) by mouth 2 (two) times daily., Disp: 60 tablet, Rfl: 0 .  losartan (COZAAR) 100 MG tablet, Take 1 tablet (100 mg total) by mouth daily., Disp: 30 tablet, Rfl: 11 .  Lumateperone Tosylate (CAPLYTA) 42 MG CAPS, Take 42 mg by mouth at bedtime., Disp: 30 capsule, Rfl:  .  magnesium oxide (MAG-OX) 400 (241.3 Mg) MG tablet, TAKE 2 TABLETS BY MOUTH EVERY MORNING AND 1 TABLET EVERY EVENING (AM+NOON+BEDTIME), Disp: 90 tablet, Rfl: 3 .  metFORMIN (GLUCOPHAGE) 500 MG tablet, TAKE ONE TABLET BY MOUTH TWICE DAILY WITH A MEAL (NOON+BEDTIME), Disp: 180 tablet, Rfl: 0 .  Misc. Devices MISC, Requires O2 @ 3L/min continuously via nasal canula and home fill system,. (Patient taking differently: 1 each by Other route See admin instructions. Requires O2 @ 3L/min continuously via nasal canula and home fill system,.), Disp: 1 each, Rfl:  0 .  Misc. Devices MISC, Please provide Benjamin [name] with insurance approved portable O2 concentrator ICD 10 J96.11 [phone] (Patient taking differently: 1 each by Other route See admin instructions. Please provide Benjamin [name] with insurance approved portable O2 concentrator ICD 10 J96.11 Z99.81), Disp: 1 each, Rfl: 0 .  Misc. Devices MISC, Please provide patient with insurance approved LARGE right hand splint ICD 10 G56.01, Disp: 1 each, Rfl: 0 .  omeprazole (PRILOSEC) 20 MG capsule, TAKE 1 CAPSULE BY MOUTH 2  (TWO) TIMES DAILY BEFORE A MEAL. TAKE FIRST CAPSULE 30 MIN PRIOR TO EATING OR TAKING OTHER MEDICATIONS. (AM+BEDTIME), Disp: 180 capsule, Rfl: 2 .  potassium chloride (KLOR-CON) 10 MEQ tablet, Take 8 tablets (80 mEq total) by mouth daily., Disp: 240 tablet, Rfl: 3 .  PROVENTIL HFA 108 (90 Base) MCG/ACT inhaler, INHALE 2 PUFFS BY MOUTH EVERY 6 (SIX) HOURS AS NEEDED FOR WHEEZING OR SHORTNESS OF BREATH., Disp: 18 g, Rfl: 6 .  spironolactone (ALDACTONE) 50 MG tablet, TAKE 1 TABLET (50 MG TOTAL) BY MOUTH DAILY (NOON), Disp: 30 tablet, Rfl: 3 .  torsemide (DEMADEX) 20 MG tablet, Take 40m by mouth twice daily, Disp: 180 tablet, Rfl: 3 .  TRUEplus Lancets 28G MISC, Use as instructed. Check blood glucose level by fingerstick twice per day., Disp: 200 each, Rfl: 3 .  XARELTO 20 MG TABS tablet, TAKE 1 TABLET BY MOUTH DAILY WITH SUPPER (BEDTIME), Disp: 60 tablet, Rfl: 6   No Known Allergies  Past Medical History:  Diagnosis Date  . [place]   . Anemia 05/29/2019  . CHF (congestive heart failure) ([place]   . Chronic respiratory failure ([place]   . Diabetes mellitus without complication ([place]   . Dyspnea   . Elevated troponin 02/06/2019  . Hypertension   . Obesity   . Pulmonary embolism ([place]   . Rectal bleeding 05/29/2019  . Sleep apnea      Past Surgical History:  Procedure Laterality Date  . CARDIOVERSION N/A 08/21/2020   Procedure: CARDIOVERSION;  Surgeon: MLarey Dresser MD;  [facility]  Service: Cardiovascular;  Laterality: N/A;  . COLONOSCOPY WITH PROPOFOL Left 02/25/2019   Procedure: COLONOSCOPY WITH PROPOFOL;  Surgeon: OArta Silence MD;  [place]  Service: Endoscopy;  Laterality: Left;  . ESOPHAGOGASTRODUODENOSCOPY (EGD) WITH PROPOFOL Left 02/25/2019   Procedure: ESOPHAGOGASTRODUODENOSCOPY (EGD) WITH PROPOFOL;  Surgeon: OArta Silence MD;  [facility];  Service: Endoscopy;  Laterality: Left;  . ESOPHAGOGASTRODUODENOSCOPY (EGD) WITH PROPOFOL N/A  06/12/2019   Procedure: ESOPHAGOGASTRODUODENOSCOPY (EGD) WITH PROPOFOL;  Surgeon: MClarene Essex MD;  [place]  Service: Gastroenterology;  Laterality: N/A;  . FLEXIBLE SIGMOIDOSCOPY N/A 06/12/2019   Procedure: FLEXIBLE SIGMOIDOSCOPY;  Surgeon: MClarene Essex MD;  [place]  Service: Gastroenterology;  Laterality: N/A;  . HEMORRHOID BANDING  05/2019  . NO PAST SURGERIES    . RIGHT HEART CATH N/A 04/17/2019   Procedure: RIGHT HEART CATH;  Surgeon: MLarey Dresser MD;  [facility];  Service: Cardiovascular;  Laterality: N/A;  . TEE WITHOUT CARDIOVERSION N/A 08/21/2020   Procedure: TRANSESOPHAGEAL ECHOCARDIOGRAM (TEE);  Surgeon: MLarey Dresser MD;  [facility];  Service: Cardiovascular;  Laterality: N/A;    Family History  Problem Relation Age of Onset  . Hypertension Mother   . Diabetes Mother     Social History   Tobacco Use  . Smoking status: Former SResearch scientist (life sciences) . Smokeless tobacco: Never Used  . Tobacco comment: smoked weed in the past  Vaping Use  . Vaping Use: Never used  Substance Use Topics  . Alcohol use: Not Currently  . Drug use: Not Currently    Types: Marijuana    ROS   Objective:   Vitals: BP (!) 201/136 (BP Location: Right Arm)   Pulse 91   Temp 98.9 F (37.2 C) (Oral)   Resp 20   SpO2 96%   BP Readings from Last 3 Encounters:  10/28/20 (!) 201/136  10/26/20 (!) 157/97  10/19/20 (!) 152/90   Physical Exam Constitutional:      General: He is not in acute distress.    Appearance: Normal appearance. He is well-developed. He is not ill-appearing, toxic-appearing or diaphoretic.  HENT:     Head: Normocephalic and atraumatic.     Right Ear: External ear normal.     Left Ear: External ear normal.     Nose: Nose normal.     Mouth/Throat:     Mouth: Mucous membranes are moist.     Pharynx: Oropharynx is clear.  Eyes:     General: No scleral icterus.    Extraocular Movements: Extraocular movements  intact.     Pupils: Pupils are equal, round, and reactive to light.  Cardiovascular:     Rate and Rhythm: Normal rate and regular rhythm.     Heart sounds: Normal heart sounds. No murmur heard. No friction rub. No gallop.   Pulmonary:     Effort: Pulmonary effort is normal. No respiratory distress.     Breath sounds: Normal breath sounds. No stridor. No wheezing, rhonchi or rales.  Abdominal:     General: Bowel sounds are normal. There is no distension.     Palpations: Abdomen is soft. There is no mass.     Tenderness: There is no abdominal tenderness. There is no guarding or rebound.    Skin:    General: Skin is warm and dry.  Neurological:     Mental Status: He is alert and oriented to person, place, and time.     Cranial Nerves: No cranial nerve deficit.     Motor: No weakness.     Coordination: Coordination normal.     Gait: Gait normal.     Deep Tendon Reflexes: Reflexes normal.     Comments: Negative Romberg and pronator drift.   Psychiatric:        Mood and Affect: Mood normal.        Behavior: Behavior normal.        Thought Content: Thought content normal.        Judgment: Judgment normal.      Assessment and Plan :   PDMP not reviewed this encounter.  1. Visit for wound check   2. Essential hypertension   3. Elevated blood pressure reading     Poorly resolving wound.  No evidence of infection today.  Emphasized need for continued wound care.  Follow-up with wound care clinic, to referral was placed for outpatient chances of being seen quickly.  Regarding his hypertension, there are no signs of stroke or ACS in his symptoms or exam. Emphasized need for medical compliance which has been an issue for him in the past. Will follow up asap with his PCP. Counseled patient on potential for adverse effects with medications prescribed/recommended today, ER and return-to-clinic precautions discussed, patient verbalized understanding.    Jaynee Eagles, [place] 10/28/20  [id number]

## 2020-10-28 NOTE — ED Triage Notes (Signed)
Pt assessed in Intake room by this writer and DR. Lamptey. Pt reported a snake bite over a month ago and was treated with Anti-bx and all the redness was gone. Pt had a dsy over wound on Rt lower abd. Wound bed is light tan with drainage. Pt wants the wound cleaned out.

## 2020-10-28 NOTE — ED Triage Notes (Signed)
While removing the DSY from wound drainage observed color tan and green.

## 2020-10-29 ENCOUNTER — Telehealth (HOSPITAL_COMMUNITY): Payer: Self-pay | Admitting: Licensed Clinical Social Worker

## 2020-10-29 NOTE — Telephone Encounter (Addendum)
CSW heard back from [place] with [facility] who reports pt is still number 15 on the waitlist after getting all the continued interest letters back.  States that she will be managing a new low income site, [facility], which is currently being constructed and suggested we email the site to get pt added to the "guest list".  When they start accepting applications they will contact those on the guest list first.  CSW also reviewed pt Urgent Care notes and saw that they [facility] as well as [facility] for wound follow up.  CSW called both centers regarding appts:  [facility] doesn't have appt until March 14th at 1:15pm but put pt on the cancellation list  [facility] staff checked with MD and based on urgent care note does not appear to be a surgical candidate at this time  Pt will be seen by PCP Monday and until then will continue dressing changes as instructed by Urgent Care providers.  Informed pt of above and told him to call us if there are any changes to the wound or it becomes warm and/or tender to the touch.  Will continue to follow and assist as needed  [name], LCSW Clinical Social Worker [facility] Desk#: [phone] Cell#: [phone]

## 2020-10-31 LAB — CULTURE, BLOOD (ROUTINE X 2)
Culture: NO GROWTH
Special Requests: ADEQUATE

## 2020-11-02 ENCOUNTER — Other Ambulatory Visit: Payer: Self-pay | Admitting: Nurse Practitioner

## 2020-11-02 ENCOUNTER — Encounter: Payer: Self-pay | Admitting: Nurse Practitioner

## 2020-11-02 ENCOUNTER — Ambulatory Visit: Payer: Medicaid Other | Attending: Nurse Practitioner | Admitting: Nurse Practitioner

## 2020-11-02 ENCOUNTER — Other Ambulatory Visit: Payer: Self-pay

## 2020-11-02 VITALS — BP 174/86 | HR 78 | Temp 98.0°F | Ht 70.0 in | Wt 354.0 lb

## 2020-11-02 DIAGNOSIS — S31109A Unspecified open wound of abdominal wall, unspecified quadrant without penetration into peritoneal cavity, initial encounter: Secondary | ICD-10-CM

## 2020-11-02 DIAGNOSIS — E1165 Type 2 diabetes mellitus with hyperglycemia: Secondary | ICD-10-CM

## 2020-11-02 DIAGNOSIS — E118 Type 2 diabetes mellitus with unspecified complications: Secondary | ICD-10-CM | POA: Diagnosis not present

## 2020-11-02 DIAGNOSIS — IMO0002 Reserved for concepts with insufficient information to code with codable children: Secondary | ICD-10-CM

## 2020-11-02 LAB — GLUCOSE, POCT (MANUAL RESULT ENTRY): POC Glucose: 396 mg/dl — AB (ref 70–99)

## 2020-11-02 NOTE — Progress Notes (Signed)
Assessment & Plan:  Benjamin [name] was seen today for follow-up.  Diagnoses and all orders for this visit:  Wound of abdomen Clean affected area with soap and water gently Cover with non occlusive dressing.   Diabetes mellitus type 2, uncontrolled, with complications (HCC) -     Glucose (CBG) Continue blood sugar control as discussed in office today, low carbohydrate diet, and regular physical exercise as tolerated, 150 minutes per week (30 min each day, 5 days per week, or 50 min 3 days per week).      Patient has been counseled on age-appropriate routine health concerns for screening and prevention. These are reviewed and up-to-date. Referrals have been placed accordingly. Immunizations are up-to-date or declined.    Subjective:   Chief Complaint  Patient presents with  . Follow-up    Patient is here for urgent care follow up his snack bite wound.    HPI Benjamin [name] 55 y.o. male presents to office today for follow up to abdominal wound secondary to snake bite.  He was evaluated in the emergency room on  February 9.  He was initially seen by his cardiologist for a routine follow-up on January 31 and was noted for abdominal cellulitis due to snakebite.  He was started on Augmentin for 7 days and instructed to follow-up with me for evaluation however did not have any appointments available.  He went to the emergency room for this on February 7 and left without being seen.  He then returned to the emergency room on February 9 for wound check after completion of Augmentin and was referred to general surgery for poorly resolving wound with no evidence of infection.  Since the referral has been placed general surgery has now stated that patient would benefit from being referred to the wound care center as current wound does not require any surgical intervention.  He has been referred to the wound care center and appointment has been scheduled for March 14th.  This is the first time today  that I am evaluating him personally for his abdominal wound. He has a large adhesive bandaid over the site which I have advised him not to use at this time. He was given 4x4 gauze and paper tape today here in the office. He states he went fishing and fell over a log and upon falling there was a snake under the log that bit him in the abdomen.         Blood pressure is elevated and he is not using his portable oxygen today.  Sats were in the 80s on arrival however he was in no acute respiratory distress and was placed on our portable O2 tank with oxygen saturations recovering to 92% with 2L.  He is aware that he should not be without his O2 and states he was rushed by the driver this morning to get him to his appointment and did not have time to get his oxygen.  Blood pressure is also elevated and he states he has not taken his blood pressure medication today as well.  Denies chest pain, shortness of breath, palpitations, lightheadedness, dizziness, headaches or BLE edema.  BP Readings from Last 3 Encounters:  11/02/20 (!) 174/86  10/28/20 (!) 201/136  10/26/20 (!) 157/97    Review of Systems  Constitutional: Negative for fever, malaise/fatigue and weight loss.  HENT: Negative.  Negative for nosebleeds.   Eyes: Negative.  Negative for blurred vision, double vision and photophobia.  Respiratory: Negative.  Negative for cough  and shortness of breath.   Cardiovascular: Negative.  Negative for chest pain, palpitations and leg swelling.  Gastrointestinal: Negative.  Negative for heartburn, nausea and vomiting.  Musculoskeletal: Negative.  Negative for myalgias.  Skin:       SEE HPI  Neurological: Negative.  Negative for dizziness, focal weakness, seizures and headaches.  Psychiatric/Behavioral: Negative.  Negative for suicidal ideas.    Past Medical History:  Diagnosis Date  . A-fib ([place])   . Anemia 05/29/2019  . CHF (congestive heart failure) ([place])   . Chronic respiratory failure ([place])    . Diabetes mellitus without complication ([place])   . Dyspnea   . Elevated troponin 02/06/2019  . Hypertension   . Obesity   . Pulmonary embolism ([place])   . Rectal bleeding 05/29/2019  . Sleep apnea     Past Surgical History:  Procedure Laterality Date  . CARDIOVERSION N/A 08/21/2020   Procedure: CARDIOVERSION;  Surgeon: Larey Dresser, MD;  [facility];  Service: Cardiovascular;  Laterality: N/A;  . COLONOSCOPY WITH PROPOFOL Left 02/25/2019   Procedure: COLONOSCOPY WITH PROPOFOL;  Surgeon: Arta Silence, MD;  Location: [place];  Service: Endoscopy;  Laterality: Left;  . ESOPHAGOGASTRODUODENOSCOPY (EGD) WITH PROPOFOL Left 02/25/2019   Procedure: ESOPHAGOGASTRODUODENOSCOPY (EGD) WITH PROPOFOL;  Surgeon: Arta Silence, MD;  Location: [facility]OSCOPY;  Service: Endoscopy;  Laterality: Left;  . ESOPHAGOGASTRODUODENOSCOPY (EGD) WITH PROPOFOL N/A 06/12/2019   Procedure: ESOPHAGOGASTRODUODENOSCOPY (EGD) WITH PROPOFOL;  Surgeon: Clarene Essex, MD;  Location: [place];  Service: Gastroenterology;  Laterality: N/A;  . FLEXIBLE SIGMOIDOSCOPY N/A 06/12/2019   Procedure: FLEXIBLE SIGMOIDOSCOPY;  Surgeon: Clarene Essex, MD;  Location: [place];  Service: Gastroenterology;  Laterality: N/A;  . HEMORRHOID BANDING  05/2019  . NO PAST SURGERIES    . RIGHT HEART CATH N/A 04/17/2019   Procedure: RIGHT HEART CATH;  Surgeon: Larey Dresser, MD;  Location: [place] [facility];  Service: Cardiovascular;  Laterality: N/A;  . TEE WITHOUT CARDIOVERSION N/A 08/21/2020   Procedure: TRANSESOPHAGEAL ECHOCARDIOGRAM (TEE);  Surgeon: Larey Dresser, MD;  Location: [facility];  Service: Cardiovascular;  Laterality: N/A;    Family History  Problem Relation Age of Onset  . Hypertension Mother   . Diabetes Mother     Social History Reviewed with no changes to be made today.   Outpatient Medications Prior to Visit  Medication Sig Dispense Refill  . atorvastatin (LIPITOR) 40 MG tablet Take 1 tablet  (40 mg total) by mouth at bedtime. 90 tablet 2  . Blood Glucose Monitoring Suppl (TRUE METRIX METER) w/Device KIT Use as instructed. Check blood glucose level by fingerstick twice per day. 1 kit 0  . Blood Pressure Monitor DEVI Please provide patient with insurance approved blood pressure device with L-XL cuff. BMI 55 (Patient taking differently: 1 each by Other route See admin instructions. Please provide patient with insurance approved blood pressure device with L-XL cuff. BMI 55) 1 each 0  . carboxymethylcellulose (REFRESH PLUS) 0.5 % SOLN Place 1 drop into both eyes daily.    Marland Kitchen diltiazem (CARDIZEM CD) 240 MG 24 hr capsule Take 1 capsule (240 mg total) by mouth daily. 30 capsule 3  . docusate sodium (COLACE) 100 MG capsule Take 1 capsule (100 mg total) by mouth 2 (two) times daily. 180 capsule 2  . doxazosin (CARDURA) 2 MG tablet Take 1 tablet (2 mg total) by mouth daily. 30 tablet 11  . glucose blood (TRUE METRIX BLOOD GLUCOSE TEST) test strip Use as instructed. Check blood glucose  level by fingerstick twice per day. 100 each 12  . hydrALAZINE (APRESOLINE) 50 MG tablet Take 1 tablet (50 mg total) by mouth 3 (three) times daily. 90 tablet 3  . labetalol (NORMODYNE) 200 MG tablet Take 1 tablet (200 mg total) by mouth 2 (two) times daily. 60 tablet 0  . losartan (COZAAR) 100 MG tablet Take 1 tablet (100 mg total) by mouth daily. 30 tablet 11  . Lumateperone Tosylate (CAPLYTA) 42 MG CAPS Take 42 mg by mouth at bedtime. 30 capsule   . magnesium oxide (MAG-OX) 400 (241.3 Mg) MG tablet TAKE 2 TABLETS BY MOUTH EVERY MORNING AND 1 TABLET EVERY EVENING (AM+NOON+BEDTIME) 90 tablet 3  . metFORMIN (GLUCOPHAGE) 500 MG tablet TAKE ONE TABLET BY MOUTH TWICE DAILY WITH A MEAL (NOON+BEDTIME) 180 tablet 0  . Misc. Devices MISC Requires O2 @ 3L/min continuously via nasal canula and home fill system,. (Patient taking differently: 1 each by Other route See admin instructions. Requires O2 @ 3L/min continuously via nasal  canula and home fill system,.) 1 each 0  . Misc. Devices MISC Please provide Mr. Kimbrell with insurance approved portable O2 concentrator ICD 10 J96.11 [phone] (Patient taking differently: 1 each by Other route See admin instructions. Please provide Mr. Westfall with insurance approved portable O2 concentrator ICD 10 J96.11 Z99.81) 1 each 0  . Misc. Devices MISC Please provide patient with insurance approved LARGE right hand splint ICD 10 G56.01 1 each 0  . omeprazole (PRILOSEC) 20 MG capsule TAKE 1 CAPSULE BY MOUTH 2 (TWO) TIMES DAILY BEFORE A MEAL. TAKE FIRST CAPSULE 30 MIN PRIOR TO EATING OR TAKING OTHER MEDICATIONS. (AM+BEDTIME) 180 capsule 2  . potassium chloride (KLOR-CON) 10 MEQ tablet Take 8 tablets (80 mEq total) by mouth daily. 240 tablet 3  . PROVENTIL HFA 108 (90 Base) MCG/ACT inhaler INHALE 2 PUFFS BY MOUTH EVERY 6 (SIX) HOURS AS NEEDED FOR WHEEZING OR SHORTNESS OF BREATH. 18 g 6  . spironolactone (ALDACTONE) 50 MG tablet TAKE 1 TABLET (50 MG TOTAL) BY MOUTH DAILY (NOON) 30 tablet 3  . torsemide (DEMADEX) 20 MG tablet Take 66m by mouth twice daily 180 tablet 3  . TRUEplus Lancets 28G MISC Use as instructed. Check blood glucose level by fingerstick twice per day. 200 each 3  . XARELTO 20 MG TABS tablet TAKE 1 TABLET BY MOUTH DAILY WITH SUPPER (BEDTIME) 60 tablet 6  . amoxicillin-clavulanate (AUGMENTIN) 875-125 MG tablet Take 1 tablet by mouth 2 (two) times daily. (Patient not taking: Reported on 11/02/2020) 14 tablet 0  . ferrous sulfate 325 (65 FE) MG tablet Take 1 tablet (325 mg total) by mouth daily. 90 tablet 2   No facility-administered medications prior to visit.    No Known Allergies     Objective:    BP (!) 174/86 (BP Location: Right Arm, Patient Position: Sitting, Cuff Size: Large)   Pulse 78   Temp 98 F (36.7 C) (Oral)   Ht 5' 10"  (1.778 m)   Wt (!) 354 lb (160.6 kg)   SpO2 92%   BMI 50.79 kg/m  Wt Readings from Last 3 Encounters:  11/02/20 (!) 354 lb (160.6 kg)   10/19/20 (!) 353 lb 12.8 oz (160.5 kg)  10/15/20 (!) 353 lb (160.1 kg)    Physical Exam Vitals and nursing note reviewed.  Constitutional:      Appearance: He is well-developed and well-nourished.  HENT:     Head: Normocephalic and atraumatic.  Eyes:     Extraocular Movements: EOM normal.  Cardiovascular:     Rate and Rhythm: Normal rate and regular rhythm.     Pulses: Intact distal pulses.     Heart sounds: Normal heart sounds. No murmur heard. No friction rub. No gallop.   Pulmonary:     Effort: Pulmonary effort is normal. No tachypnea or respiratory distress.     Breath sounds: Normal breath sounds. No decreased breath sounds, wheezing, rhonchi or rales.  Chest:     Chest wall: No tenderness.  Abdominal:     General: Bowel sounds are normal.     Palpations: Abdomen is soft.  Musculoskeletal:        General: No edema. Normal range of motion.     Cervical back: Normal range of motion.  Skin:    General: Skin is warm and dry.       Neurological:     Mental Status: He is alert and oriented to person, place, and time.     Coordination: Coordination normal.  Psychiatric:        Mood and Affect: Mood and affect normal.        Behavior: Behavior normal. Behavior is cooperative.        Thought Content: Thought content normal.        Judgment: Judgment normal.          Patient has been counseled extensively about nutrition and exercise as well as the importance of adherence with medications and regular follow-up. The patient was given clear instructions to go to ER or return to medical center if symptoms don't improve, worsen or new problems develop. The patient verbalized understanding.   Follow-up: Return for See me in april already scheduled.   [name], FNP-BC [facility] [place] [place]   11/02/2020, 1:44 PM

## 2020-11-03 DIAGNOSIS — Z634 Disappearance and death of family member: Secondary | ICD-10-CM | POA: Diagnosis not present

## 2020-11-04 ENCOUNTER — Other Ambulatory Visit (HOSPITAL_COMMUNITY): Payer: Self-pay | Admitting: Cardiology

## 2020-11-04 ENCOUNTER — Other Ambulatory Visit (HOSPITAL_COMMUNITY): Payer: Self-pay

## 2020-11-04 NOTE — Progress Notes (Signed)
Paramedicine Encounter    Patient ID: Benjamin [name], male    DOB: 1966/05/02, 55 y.o.   MRN: [id number]   Patient Care Team: [name], NP as PCP - General (Nurse Practitioner) [name], MD as PCP - Cardiology (Cardiology) [name], LCSW as Social Worker (Licensed Clinical Social Worker)  Patient Active Problem List   Diagnosis Date Noted  . Polysubstance abuse ([place]) 08/20/2020  . Alcohol abuse 08/20/2020  . Atrial fibrillation with rapid ventricular response ([place]) 08/19/2020  . Obesity hypoventilation syndrome ([place]) 06/12/2020  . Healthcare maintenance 06/12/2020  . Acute on chronic diastolic heart failure ([place]) 02/24/2020  . Diabetic neuropathy ([place]) 11/20/2019  . Coagulation disorder ([place]) 11/20/2019  . Acute on chronic diastolic CHF (congestive heart failure), NYHA class 3 ([place]) 07/30/2019  . Diabetes mellitus without complication ([place]) 82/42/3536  . Hypertensive emergency   . BRBPR (bright red blood per rectum) 06/12/2019  . Hypertension   . CHF (congestive heart failure) ([place])   . Sleep apnea   . Chronic respiratory failure ([place])   . Hypokalemia 05/29/2019  . GI bleed 05/29/2019  . Anemia 05/28/2019  . Acute on chronic diastolic CHF (congestive heart failure) ([place]) 04/11/2019  . Iron deficiency anemia 03/11/2019  . OSA on CPAP 03/11/2019  . HLD (hyperlipidemia) 03/11/2019  . Elevated troponin 03/11/2019  . GERD (gastroesophageal reflux disease) 03/11/2019  . Rectal bleeding 02/20/2019  . Dyspnea 02/06/2019  . COVID-19 virus infection 02/06/2019  . Bilateral lower extremity edema   . Morbid obesity with BMI of 50.0-59.9, adult ([place])   . Chronic diastolic CHF (congestive heart failure) ([place])   . PAF (paroxysmal atrial fibrillation) ([place])   . PE (pulmonary thromboembolism) ([place]) 01/21/2019  . Hypertensive urgency 01/21/2019  . Diabetes mellitus type 2 in obese ([place]) 01/21/2019    Current Outpatient Medications:  .  atorvastatin (LIPITOR) 40 MG  tablet, Take 1 tablet (40 mg total) by mouth at bedtime., Disp: 90 tablet, Rfl: 2 .  Blood Glucose Monitoring Suppl (TRUE METRIX METER) w/Device KIT, Use as instructed. Check blood glucose level by fingerstick twice per day., Disp: 1 kit, Rfl: 0 .  Blood Pressure Monitor DEVI, Please provide patient with insurance approved blood pressure device with L-XL cuff. BMI 55 (Patient taking differently: 1 each by Other route See admin instructions. Please provide patient with insurance approved blood pressure device with L-XL cuff. BMI 55), Disp: 1 each, Rfl: 0 .  carboxymethylcellulose (REFRESH PLUS) 0.5 % SOLN, Place 1 drop into both eyes daily., Disp: , Rfl:  .  diltiazem (CARDIZEM CD) 240 MG 24 hr capsule, Take 1 capsule (240 mg total) by mouth daily., Disp: 30 capsule, Rfl: 3 .  docusate sodium (COLACE) 100 MG capsule, Take 1 capsule (100 mg total) by mouth 2 (two) times daily., Disp: 180 capsule, Rfl: 2 .  doxazosin (CARDURA) 2 MG tablet, Take 1 tablet (2 mg total) by mouth daily., Disp: 30 tablet, Rfl: 11 .  glucose blood (TRUE METRIX BLOOD GLUCOSE TEST) test strip, Use as instructed. Check blood glucose level by fingerstick twice per day., Disp: 100 each, Rfl: 12 .  hydrALAZINE (APRESOLINE) 50 MG tablet, Take 1 tablet (50 mg total) by mouth 3 (three) times daily., Disp: 90 tablet, Rfl: 3 .  labetalol (NORMODYNE) 200 MG tablet, Take 1 tablet (200 mg total) by mouth 2 (two) times daily., Disp: 60 tablet, Rfl: 0 .  magnesium oxide (MAG-OX) 400 (241.3 Mg) MG tablet, TAKE 2 TABLETS BY MOUTH EVERY MORNING  AND 1 TABLET EVERY EVENING (AM+NOON+BEDTIME), Disp: 90 tablet, Rfl: 3 .  metFORMIN (GLUCOPHAGE) 500 MG tablet, TAKE ONE TABLET BY MOUTH TWICE DAILY WITH A MEAL (NOON+BEDTIME), Disp: 180 tablet, Rfl: 0 .  Misc. Devices MISC, Requires O2 @ 3L/min continuously via nasal canula and home fill system,. (Patient taking differently: 1 each by Other route See admin instructions. Requires O2 @ 3L/min continuously via  nasal canula and home fill system,.), Disp: 1 each, Rfl: 0 .  Misc. Devices MISC, Please provide Mr. Dohrmann with insurance approved portable O2 concentrator ICD 10 J96.11 [phone] (Patient taking differently: 1 each by Other route See admin instructions. Please provide Mr. Rehm with insurance approved portable O2 concentrator ICD 10 J96.11 [id number]), Disp: 1 each, Rfl: 0 .  omeprazole (PRILOSEC) 20 MG capsule, TAKE 1 CAPSULE BY MOUTH 2 (TWO) TIMES DAILY BEFORE A MEAL. TAKE FIRST CAPSULE 30 MIN PRIOR TO EATING OR TAKING OTHER MEDICATIONS. (AM+BEDTIME), Disp: 180 capsule, Rfl: 2 .  potassium chloride (KLOR-CON) 10 MEQ tablet, Take 8 tablets (80 mEq total) by mouth daily., Disp: 240 tablet, Rfl: 3 .  PROVENTIL HFA 108 (90 Base) MCG/ACT inhaler, INHALE 2 PUFFS BY MOUTH EVERY 6 (SIX) HOURS AS NEEDED FOR WHEEZING OR SHORTNESS OF BREATH., Disp: 18 g, Rfl: 6 .  spironolactone (ALDACTONE) 50 MG tablet, TAKE 1 TABLET (50 MG TOTAL) BY MOUTH DAILY (NOON), Disp: 30 tablet, Rfl: 3 .  torsemide (DEMADEX) 20 MG tablet, Take $RemoveBefo'60mg'[id number]$  by mouth twice daily, Disp: 180 tablet, Rfl: 3 .  TRUEplus Lancets 28G MISC, Use as instructed. Check blood glucose level by fingerstick twice per day., Disp: 200 each, Rfl: 3 .  XARELTO 20 MG TABS tablet, TAKE 1 TABLET BY MOUTH DAILY WITH SUPPER (BEDTIME), Disp: 60 tablet, Rfl: 6 .  amoxicillin-clavulanate (AUGMENTIN) 875-125 MG tablet, Take 1 tablet by mouth 2 (two) times daily. (Patient not taking: No sig reported), Disp: 14 tablet, Rfl: 0 .  ferrous sulfate 325 (65 FE) MG tablet, Take 1 tablet (325 mg total) by mouth daily., Disp: 90 tablet, Rfl: 2 .  losartan (COZAAR) 100 MG tablet, Take 1 tablet (100 mg total) by mouth daily. (Patient not taking: Reported on 11/04/2020), Disp: 30 tablet, Rfl: 11 .  Lumateperone Tosylate (CAPLYTA) 42 MG CAPS, Take 42 mg by mouth at bedtime. (Patient not taking: Reported on 11/04/2020), Disp: 30 capsule, Rfl:  .  Misc. Devices MISC, Please provide patient  with insurance approved LARGE right hand splint ICD 10 G56.01 (Patient not taking: Reported on 11/04/2020), Disp: 1 each, Rfl: 0 No Known Allergies    Social History   Socioeconomic History  . Marital status: Single    Spouse name: Not on file  . Number of children: Not on file  . Years of education: Not on file  . Highest education level: Not on file  Occupational History  . Not on file  Tobacco Use  . Smoking status: Former Research scientist (life sciences)  . Smokeless tobacco: Never Used  . Tobacco comment: smoked weed in the past  Vaping Use  . Vaping Use: Never used  Substance and Sexual Activity  . Alcohol use: Not Currently  . Drug use: Not Currently    Types: Marijuana  . Sexual activity: Not Currently  Other Topics Concern  . Not on file  Social History Narrative  . Not on file   Social Determinants of Health   Financial Resource Strain: Medium Risk  . Difficulty of Paying Living Expenses: Somewhat hard  Food Insecurity: Food Insecurity Present  .  Worried About Charity fundraiser in the Last Year: Sometimes true  . Ran Out of Food in the Last Year: Sometimes true  Transportation Needs: No Transportation Needs  . Lack of Transportation (Medical): No  . Lack of Transportation (Non-Medical): No  Physical Activity: Not on file  Stress: Not on file  Social Connections: Not on file  Intimate Partner Violence: Not on file    Physical Exam      Future Appointments  Date Time Provider [place]  11/25/2020 10:15 AM Felipa Furnace, DPM [facility]  11/25/2020  8:00 PM [name], MD [facility]  11/30/2020  1:15 PM Ricard Dillon, MD [facility]  12/21/2020  9:50 AM [name], NP [facility] None  01/14/2021 11:30 AM [facility] PA/NP [facility] None    BP (!) 162/84   Pulse 86   Resp 20   Wt (!) 354 lb (160.6 kg)   SpO2 95%   BMI 50.79 kg/m   Weight yesterday-354 Last visit weight-354  Pt reports he is doing better, the wound on his side is healing up  better. His PCP sent referral for wound care so he will f/u with them.  His PCP sent those hand splints to [facility] and the office will reach out again to see the status.  He no longer wants to take the meds from [facility].  He needs more bubble packs. Ask pharmacy to get them ready for him. Today was his last dose.  Pt denies sob, no dizziness,  No c/p, no bleeding issues.   [name], [facility] Paramedic  11/04/20

## 2020-11-05 ENCOUNTER — Telehealth (HOSPITAL_COMMUNITY): Payer: Self-pay

## 2020-11-05 NOTE — Telephone Encounter (Signed)
Pt finally called me back and to confirm he is able to p/u his bubble packs from pharmacy and then he called back after he picked them up.   Kerry Hough, EMT-Paramedic 11/05/20

## 2020-11-10 DIAGNOSIS — Z634 Disappearance and death of family member: Secondary | ICD-10-CM | POA: Diagnosis not present

## 2020-11-11 ENCOUNTER — Other Ambulatory Visit: Payer: Self-pay

## 2020-11-11 ENCOUNTER — Encounter (HOSPITAL_BASED_OUTPATIENT_CLINIC_OR_DEPARTMENT_OTHER): Payer: Medicaid Other | Attending: Internal Medicine | Admitting: Physician Assistant

## 2020-11-11 DIAGNOSIS — I5042 Chronic combined systolic (congestive) and diastolic (congestive) heart failure: Secondary | ICD-10-CM | POA: Insufficient documentation

## 2020-11-11 DIAGNOSIS — Z7901 Long term (current) use of anticoagulants: Secondary | ICD-10-CM | POA: Insufficient documentation

## 2020-11-11 DIAGNOSIS — E11622 Type 2 diabetes mellitus with other skin ulcer: Secondary | ICD-10-CM | POA: Diagnosis not present

## 2020-11-11 DIAGNOSIS — Z9981 Dependence on supplemental oxygen: Secondary | ICD-10-CM | POA: Insufficient documentation

## 2020-11-11 DIAGNOSIS — I480 Paroxysmal atrial fibrillation: Secondary | ICD-10-CM | POA: Insufficient documentation

## 2020-11-11 DIAGNOSIS — I110 Hypertensive heart disease with heart failure: Secondary | ICD-10-CM | POA: Diagnosis not present

## 2020-11-11 DIAGNOSIS — L98492 Non-pressure chronic ulcer of skin of other sites with fat layer exposed: Secondary | ICD-10-CM | POA: Diagnosis not present

## 2020-11-11 DIAGNOSIS — I5032 Chronic diastolic (congestive) heart failure: Secondary | ICD-10-CM | POA: Diagnosis not present

## 2020-11-11 DIAGNOSIS — Z833 Family history of diabetes mellitus: Secondary | ICD-10-CM | POA: Diagnosis not present

## 2020-11-11 DIAGNOSIS — J9611 Chronic respiratory failure with hypoxia: Secondary | ICD-10-CM | POA: Diagnosis not present

## 2020-11-11 NOTE — Progress Notes (Signed)
DEVERY, MURGIA ([id number]) Visit Report for 11/11/2020 Chief Complaint Document Details Patient Name: Date of Service: Benjamin [name] 11/11/2020 9:00 A M Medical Record Number: [id number] Patient Account Number: [id number] Date of Birth/Sex: Treating RN: 04-03-1966 (55 y.o. [name] Primary Care Provider: [name] Other Clinician: Referring Provider: Treating Provider/Extender: [name], [name] Weeks in Treatment: 0 Information Obtained from: Patient Chief Complaint Right abdominal ulcer/snake bite Electronic Signature(s) Signed: 11/11/2020 9:37:52 AM By: Lenda Kelp PA-C Entered By: Lenda Kelp on 11/11/2020 09:37:52 -------------------------------------------------------------------------------- HPI Details Patient Name: Date of Service: Benjamin [name], Benjamin Gain R. 11/11/2020 9:00 A M Medical Record Number: [id number] Patient Account Number: [id number] Date of Birth/Sex: Treating RN: 11-10-1965 (55 y.o. [name] Primary Care Provider: [name] Other Clinician: Referring Provider: Treating Provider/Extender: [name], [name] Weeks in Treatment: 0 History of Present Illness HPI Description: 11/11/2020 upon evaluation today patient appears to be doing decently well after sustaining a snakebite when he was fishing about a month ago. Subsequently he tells me it was a black snake that bit him so apparently nonvenomous and that some of the individuals he was fishing with actually ate the snake. With that being said he overall tells me that he is concerned only with the fact that he wants this to heal it is doing a little bit better than it was but nonetheless he wonders if there is anything we can do to help things along here. Fortunately there is no signs of active infection at this time. No fevers, chills, nausea, vomiting, or diarrhea. He does have a history of paroxysmal atrial fibrillation. Long-term use of  anticoagulants, congestive heart failure, he does use oxygen at home at times he tells me, and he has hypertension. Fortunately there does not appear to be evidence of infection at the moment. Electronic Signature(s) Signed: 11/11/2020 9:46:59 AM By: Lenda Kelp PA-C Entered By: Lenda Kelp on 11/11/2020 09:46:58 -------------------------------------------------------------------------------- Physical Exam Details Patient Name: Date of Service: Benjamin Farmer R. 11/11/2020 9:00 A M Medical Record Number: [id number] Patient Account Number: [id number] Date of Birth/Sex: Treating RN: 1965/12/12 (55 y.o. [name] Primary Care Provider: [name] Other Clinician: Referring Provider: Treating Provider/Extender: [name], [name] in Treatment: 0 Constitutional patient is hypertensive.. pulse regular and within target range for patient.Marland Kitchen respirations regular, non-labored and within target range for patient.Marland Kitchen temperature within target range for patient.. Obese and well-hydrated in no acute distress. Eyes conjunctiva clear no eyelid edema noted. pupils equal round and reactive to light and accommodation. Ears, Nose, Mouth, and Throat no gross abnormality of ear auricles or external auditory canals. normal hearing noted during conversation. mucus membranes moist. Respiratory normal breathing without difficulty. clear to auscultation bilaterally. Cardiovascular no clubbing, cyanosis, significant edema, <3 sec cap refill. Musculoskeletal normal gait and posture. no significant deformity or arthritic changes, no loss or range of motion, no clubbing. Psychiatric this patient is able to make decisions and demonstrates good insight into disease process. Alert and Oriented x 3. pleasant and cooperative. Notes Upon inspection patient's wound bed actually showed signs of fairly good granulation at this time. There does not appear to be any evidence of  active infection. There this may be slight hypergranulation but in general I am extremely pleased with where things stand at this point. There is no evidence of local nor systemic infection at this point. No sharp debridement was necessary currently. Electronic Signature(s) Signed: 11/11/2020 9:47:38  AM By: Lenda Kelp PA-C Entered By: Lenda Kelp on 11/11/2020 09:47:37 -------------------------------------------------------------------------------- Physician Orders Details Patient Name: Date of Service: Benjamin [name], Benjamin Gain R. 11/11/2020 9:00 A M Medical Record Number: [id number] Patient Account Number: [id number] Date of Birth/Sex: Treating RN: 1966-03-10 (55 y.o. [name] Primary Care Provider: [name] Other Clinician: Referring Provider: Treating Provider/Extender: [name], [name] in Treatment: 0 Verbal / Phone Orders: No Diagnosis Coding ICD-10 Coding Code Description W59.11XA Bitten by nonvenomous snake, initial encounter L98.492 Non-pressure chronic ulcer of skin of other sites with fat layer exposed I48.0 Paroxysmal atrial fibrillation Z79.01 Long term (current) use of anticoagulants I50.42 Chronic combined systolic (congestive) and diastolic (congestive) heart failure Z99.81 Dependence on supplemental oxygen I10 Essential (primary) hypertension Follow-up Appointments Return Appointment in 1 week. Bathing/ Shower/ Hygiene May shower and wash wound with soap and water. Wound Treatment Wound #1 - Abdomen - Lower Quadrant Wound Laterality: Right Prim Dressing: PolyMem Non-Adhesive Dressing, 4x4 in Every Other Day/30 Days ary Discharge Instructions: Apply to wound bed as instructed Secondary Dressing: ComfortFoam Border, 4x4 in (silicone border) Every Other Day/30 Days Discharge Instructions: Apply over primary dressing as directed. Electronic Signature(s) Signed: 11/11/2020 5:21:35 PM By: [name] RN, BSN Signed: 11/11/2020  6:06:11 PM By: Lenda Kelp PA-C Entered By: [name] on 11/11/2020 09:42:46 -------------------------------------------------------------------------------- Problem List Details Patient Name: Date of Service: Benjamin [name], Benjamin Gain R. 11/11/2020 9:00 A M Medical Record Number: [id number] Patient Account Number: [id number] Date of Birth/Sex: Treating RN: 1965-11-01 (55 y.o. [name] Primary Care Provider: [name] Other Clinician: Referring Provider: Treating Provider/Extender: [name], [name] Weeks in Treatment: 0 Active Problems ICD-10 Encounter Code Description Active Date MDM Diagnosis W59.11XA Bitten by nonvenomous snake, initial encounter 11/11/2020 No Yes L98.492 Non-pressure chronic ulcer of skin of other sites with fat layer exposed 11/11/2020 No Yes I48.0 Paroxysmal atrial fibrillation 11/11/2020 No Yes Z79.01 Long term (current) use of anticoagulants 11/11/2020 No Yes I50.42 Chronic combined systolic (congestive) and diastolic (congestive) heart failure 11/11/2020 No Yes Z99.81 Dependence on supplemental oxygen 11/11/2020 No Yes I10 Essential (primary) hypertension 11/11/2020 No Yes Inactive Problems Resolved Problems Electronic Signature(s) Signed: 11/11/2020 9:37:29 AM By: Lenda Kelp PA-C Entered By: Lenda Kelp on 11/11/2020 09:37:29 -------------------------------------------------------------------------------- Progress Note Details Patient Name: Date of Service: Benjamin [name], Benjamin Gain R. 11/11/2020 9:00 A M Medical Record Number: [id number] Patient Account Number: [id number] Date of Birth/Sex: Treating RN: Dec 23, 1965 (55 y.o. [name] Primary Care Provider: [name] Other Clinician: Referring Provider: Treating Provider/Extender: [name] Weeks in Treatment: 0 Subjective Chief Complaint Information obtained from Patient Right abdominal ulcer/snake bite History of Present Illness  (HPI) 11/11/2020 upon evaluation today patient appears to be doing decently well after sustaining a snakebite when he was fishing about a month ago. Subsequently he tells me it was a black snake that bit him so apparently nonvenomous and that some of the individuals he was fishing with actually ate the snake. With that being said he overall tells me that he is concerned only with the fact that he wants this to heal it is doing a little bit better than it was but nonetheless he wonders if there is anything we can do to help things along here. Fortunately there is no signs of active infection at this time. No fevers, chills, nausea, vomiting, or diarrhea. He does have a history of paroxysmal atrial fibrillation. Long-term use of anticoagulants, congestive heart failure,  he does use oxygen at home at times he tells me, and he has hypertension. Fortunately there does not appear to be evidence of infection at the moment. Patient History Information obtained from Patient. Allergies No Known Drug Allergies Family History Diabetes - Mother, Hypertension - Mother, No family history of Cancer, Heart Disease, Hereditary Spherocytosis, Kidney Disease, Lung Disease, Seizures, Stroke, Thyroid Problems, Tuberculosis. Social History Never smoker, Marital Status - Single, Alcohol Use - Rarely, Drug Use - No History, Caffeine Use - Daily - d. soda. Medical History Eyes Denies history of Cataracts, Glaucoma, Optic Neuritis Ear/Nose/Mouth/Throat Denies history of Chronic sinus problems/congestion, Middle ear problems Hematologic/Lymphatic Patient has history of Anemia Denies history of Hemophilia, Human Immunodeficiency Virus, Lymphedema, Sickle Cell Disease Respiratory Patient has history of Sleep Apnea - CPAP and 02 continuous Denies history of Aspiration, Asthma, Chronic Obstructive Pulmonary Disease (COPD), Pneumothorax, Tuberculosis Cardiovascular Patient has history of Arrhythmia - A. Fib., Congestive  Heart Failure, Hypertension Denies history of Angina, Coronary Artery Disease, Deep Vein Thrombosis, Hypotension, Myocardial Infarction, Peripheral Arterial Disease, Peripheral Venous Disease, Phlebitis, Vasculitis Gastrointestinal Denies history of Cirrhosis , Colitis, Crohnoos, Hepatitis A, Hepatitis B, Hepatitis C Endocrine Patient has history of Type II Diabetes Genitourinary Denies history of End Stage Renal Disease Immunological Denies history of Lupus Erythematosus, Raynaudoos, Scleroderma Integumentary (Skin) Denies history of History of Burn Musculoskeletal Denies history of Rheumatoid Arthritis, Osteoarthritis, Osteomyelitis Neurologic Denies history of Dementia, Neuropathy, Quadriplegia, Paraplegia, Seizure Disorder Oncologic Denies history of Received Chemotherapy, Received Radiation Psychiatric Denies history of Anorexia/bulimia, Confinement Anxiety Patient is treated with Controlled Diet, Oral Agents. Blood sugar is tested. Medical A Surgical History Notes nd Respiratory PE Review of Systems (ROS) Constitutional Symptoms (General Health) Denies complaints or symptoms of Fatigue, Fever, Chills, Marked Weight Change. Eyes Denies complaints or symptoms of Dry Eyes, Vision Changes, Glasses / Contacts. Ear/Nose/Mouth/Throat Denies complaints or symptoms of Chronic sinus problems or rhinitis. Respiratory Denies complaints or symptoms of Chronic or frequent coughs, Shortness of Breath. Cardiovascular Denies complaints or symptoms of Chest pain. Gastrointestinal Denies complaints or symptoms of Frequent diarrhea, Nausea, Vomiting. Endocrine Denies complaints or symptoms of Heat/cold intolerance. Genitourinary Denies complaints or symptoms of Frequent urination. Integumentary (Skin) Complains or has symptoms of Wounds - abdomen. Musculoskeletal Denies complaints or symptoms of Muscle Pain, Muscle Weakness. Neurologic Denies complaints or symptoms of  Numbness/parasthesias. Psychiatric Denies complaints or symptoms of Claustrophobia, Suicidal. Objective Constitutional patient is hypertensive.. pulse regular and within target range for patient.Marland Kitchen respirations regular, non-labored and within target range for patient.Marland Kitchen temperature within target range for patient.. Obese and well-hydrated in no acute distress. Vitals Time Taken: 8:43 AM, Height: 71 in, Source: Stated, Weight: 350 lbs, Source: Stated, BMI: 48.8, Temperature: 98.3 F, Pulse: 91 bpm, Respiratory Rate: 18 breaths/min, Blood Pressure: 168/92 mmHg, Capillary Blood Glucose: 97 mg/dl. Eyes conjunctiva clear no eyelid edema noted. pupils equal round and reactive to light and accommodation. Ears, Nose, Mouth, and Throat no gross abnormality of ear auricles or external auditory canals. normal hearing noted during conversation. mucus membranes moist. Respiratory normal breathing without difficulty. clear to auscultation bilaterally. Cardiovascular no clubbing, cyanosis, significant edema, Musculoskeletal normal gait and posture. no significant deformity or arthritic changes, no loss or range of motion, no clubbing. Psychiatric this patient is able to make decisions and demonstrates good insight into disease process. Alert and Oriented x 3. pleasant and cooperative. General Notes: Upon inspection patient's wound bed actually showed signs of fairly good granulation at this time. There does not appear to be  any evidence of active infection. There this may be slight hypergranulation but in general I am extremely pleased with where things stand at this point. There is no evidence of local nor systemic infection at this point. No sharp debridement was necessary currently. Integumentary (Hair, Skin) Wound #1 status is Open. Original cause of wound was Bite. The date acquired was: 10/28/2020. The wound is located on the Right Abdomen - Lower Quadrant. The wound measures 2.2cm length x 2.1cm width  x 0.1cm depth; 3.629cm^2 area and 0.363cm^3 volume. There is Fat Layer (Subcutaneous Tissue) exposed. There is no tunneling or undermining noted. There is a medium amount of serosanguineous drainage noted. The wound margin is distinct with the outline attached to the wound base. There is large (67-100%) pink, pale granulation within the wound bed. There is a small (1-33%) amount of necrotic tissue within the wound bed including Adherent Slough. Assessment Active Problems ICD-10 Bitten by nonvenomous snake, initial encounter Non-pressure chronic ulcer of skin of other sites with fat layer exposed Paroxysmal atrial fibrillation Long term (current) use of anticoagulants Chronic combined systolic (congestive) and diastolic (congestive) heart failure Dependence on supplemental oxygen Essential (primary) hypertension Plan Follow-up Appointments: Return Appointment in 1 week. Bathing/ Shower/ Hygiene: May shower and wash wound with soap and water. WOUND #1: - Abdomen - Lower Quadrant Wound Laterality: Right Prim Dressing: PolyMem Non-Adhesive Dressing, 4x4 in Every Other Day/30 Days ary Discharge Instructions: Apply to wound bed as instructed Secondary Dressing: ComfortFoam Border, 4x4 in (silicone border) Every Other Day/30 Days Discharge Instructions: Apply over primary dressing as directed. 1. Would recommend currently that we go ahead and initiate treatment with PolyMem I think this is can be a good option for him. 2. Also can recommend that he cover this with a next care waterproof bandage which I think would do a good job this will also allow him to be able to shower daily. 3. Also can recommend at this time that the patient continue to monitor for any signs of infection do not think he is getting need to worry too much about this to be honest. We will see patient back for reevaluation in 1 week here in the clinic. If anything worsens or changes patient will contact our office for  additional recommendations. Electronic Signature(s) Signed: 11/11/2020 9:48:09 AM By: Lenda Kelp PA-C Entered By: Lenda Kelp on 11/11/2020 09:48:08 -------------------------------------------------------------------------------- HxROS Details Patient Name: Date of Service: Benjamin [name], Benjamin Gain R. 11/11/2020 9:00 A M Medical Record Number: [id number] Patient Account Number: [id number] Date of Birth/Sex: Treating RN: 08-04-1966 (55 y.o. [name] Primary Care Provider: [name] Other Clinician: Referring Provider: Treating Provider/Extender: [name], [name] in Treatment: 0 Information Obtained From Patient Constitutional Symptoms (General Health) Complaints and Symptoms: Negative for: Fatigue; Fever; Chills; Marked Weight Change Eyes Complaints and Symptoms: Negative for: Dry Eyes; Vision Changes; Glasses / Contacts Medical History: Negative for: Cataracts; Glaucoma; Optic Neuritis Ear/Nose/Mouth/Throat Complaints and Symptoms: Negative for: Chronic sinus problems or rhinitis Medical History: Negative for: Chronic sinus problems/congestion; Middle ear problems Respiratory Complaints and Symptoms: Negative for: Chronic or frequent coughs; Shortness of Breath Medical History: Positive for: Sleep Apnea - CPAP and 02 continuous Negative for: Aspiration; Asthma; Chronic Obstructive Pulmonary Disease (COPD); Pneumothorax; Tuberculosis Past Medical History Notes: PE Cardiovascular Complaints and Symptoms: Negative for: Chest pain Medical History: Positive for: Arrhythmia - A. Fib.; Congestive Heart Failure; Hypertension Negative for: Angina; Coronary Artery Disease; Deep Vein Thrombosis; Hypotension; Myocardial Infarction; Peripheral Arterial Disease;  Peripheral Venous Disease; Phlebitis; Vasculitis Gastrointestinal Complaints and Symptoms: Negative for: Frequent diarrhea; Nausea; Vomiting Medical History: Negative for: Cirrhosis ;  Colitis; Crohns; Hepatitis A; Hepatitis B; Hepatitis C Endocrine Complaints and Symptoms: Negative for: Heat/cold intolerance Medical History: Positive for: Type II Diabetes Time with diabetes: 30 years Treated with: Oral agents, Diet Blood sugar tested every day: Yes Tested : daily Genitourinary Complaints and Symptoms: Negative for: Frequent urination Medical History: Negative for: End Stage Renal Disease Integumentary (Skin) Complaints and Symptoms: Positive for: Wounds - abdomen Medical History: Negative for: History of Burn Musculoskeletal Complaints and Symptoms: Negative for: Muscle Pain; Muscle Weakness Medical History: Negative for: Rheumatoid Arthritis; Osteoarthritis; Osteomyelitis Neurologic Complaints and Symptoms: Negative for: Numbness/parasthesias Medical History: Negative for: Dementia; Neuropathy; Quadriplegia; Paraplegia; Seizure Disorder Psychiatric Complaints and Symptoms: Negative for: Claustrophobia; Suicidal Medical History: Negative for: Anorexia/bulimia; Confinement Anxiety Hematologic/Lymphatic Medical History: Positive for: Anemia Negative for: Hemophilia; Human Immunodeficiency Virus; Lymphedema; Sickle Cell Disease Immunological Medical History: Negative for: Lupus Erythematosus; Raynauds; Scleroderma Oncologic Medical History: Negative for: Received Chemotherapy; Received Radiation Immunizations Pneumococcal Vaccine: Received Pneumococcal Vaccination: No Implantable Devices None Family and Social History Cancer: No; Diabetes: Yes - Mother; Heart Disease: No; Hereditary Spherocytosis: No; Hypertension: Yes - Mother; Kidney Disease: No; Lung Disease: No; Seizures: No; Stroke: No; Thyroid Problems: No; Tuberculosis: No; Never smoker; Marital Status - Single; Alcohol Use: Rarely; Drug Use: No History; Caffeine Use: Daily - d. soda; Financial Concerns: No; Food, Clothing or Shelter Needs: No; Support System Lacking: No; Transportation  Concerns: No Electronic Signature(s) Signed: 11/11/2020 5:17:36 PM By: Shawn Stalleaton, Bobbi Signed: 11/11/2020 6:06:11 PM By: Lenda KelpStone III, Jakell Trusty PA-C Entered By: Shawn Stalleaton, Bobbi on 11/11/2020 08:56:45 -------------------------------------------------------------------------------- SuperBill Details Patient Name: Date of Service: Benjamin [name], Benjamin JulyIMO THY R. 11/11/2020 Medical Record Number: [id number] Patient Account Number: [id number] Date of Birth/Sex: Treating RN: July 08, 1966 (55 y.o. Damaris SchoonerM) Boehlein, Linda Primary Care Provider: Bertram DenverFleming, [name] Other Clinician: Referring Provider: Treating Provider/Extender: Margaretmary LombardStone III, Garrett Mitchum Fleming, [name] in Treatment: 0 Diagnosis Coding ICD-10 Codes Code Description W59.11XA Bitten by nonvenomous snake, initial encounter L98.492 Non-pressure chronic ulcer of skin of other sites with fat layer exposed I48.0 Paroxysmal atrial fibrillation Z79.01 Long term (current) use of anticoagulants I50.42 Chronic combined systolic (congestive) and diastolic (congestive) heart failure Z99.81 Dependence on supplemental oxygen I10 Essential (primary) hypertension Facility Procedures CPT4 Code: [id number] Description: 99214 - WOUND CARE VISIT-LEV 4 EST PT Modifier: Quantity: 1 Physician Procedures : CPT4 Code Description Modifier [id number] 99213 - WC PHYS LEVEL 3 - EST PT ICD-10 Diagnosis Description W59.11XA Bitten by nonvenomous snake, initial encounter L98.492 Non-pressure chronic ulcer of skin of other sites with fat layer exposed I48.0  Paroxysmal atrial fibrillation Z79.01 Long term (current) use of anticoagulants Quantity: 1 Electronic Signature(s) Signed: 11/11/2020 9:48:21 AM By: Lenda KelpStone III, Tearsa Kowalewski PA-C Entered By: Lenda KelpStone III, Evangelia Whitaker on 11/11/2020 09:48:21

## 2020-11-11 NOTE — Progress Notes (Signed)
Benjamin [name], DUROSS ([id number]) Visit Report for 11/11/2020 Allergy List Details Patient Name: Date of Service: Benjamin [name] 11/11/2020 9:00 A M Medical Record Number: [id number] Patient Account Number: [id number] Date of Birth/Sex: Treating RN: 01-22-1966 (55 y.o. [name] Primary Care Provider: [name] Other Clinician: Referring Provider: Treating Provider/Extender: [name], [name] in Treatment: 0 Allergies Active Allergies No Known Drug Allergies Allergy Notes Electronic Signature(s) Signed: 11/11/2020 5:17:36 PM By: [name] Entered By: [name] on 11/11/2020 08:49:33 -------------------------------------------------------------------------------- Arrival Information Details Patient Name: Date of Service: Benjamin [name], Benjamin Gain R. 11/11/2020 9:00 A M Medical Record Number: [id number] Patient Account Number: [id number] Date of Birth/Sex: Treating RN: [birth year] (55 y.o. [name] Primary Care Provider: [name] Other Clinician: Referring Provider: Treating Provider/Extender: [name], [name] Weeks in Treatment: 0 Visit Information Patient Arrived: Ambulatory Arrival Time: 08:42 Accompanied By: self Transfer Assistance: None Patient Identification Verified: Yes Secondary Verification Process Completed: Yes Patient Requires Transmission-Based Precautions: No Patient Has Alerts: Yes Patient Alerts: Patient on Blood Thinner Electronic Signature(s) Signed: 11/11/2020 5:17:36 PM By: [name] Entered By: [name] on 11/11/2020 08:47:06 -------------------------------------------------------------------------------- Clinic Level of Care Assessment Details Patient Name: Date of Service: Benjamin [name] 11/11/2020 9:00 A M Medical Record Number: [id number] Patient Account Number: [id number] Date of Birth/Sex: Treating RN: March 12, 1966 (55 y.o. Benjamin [name] Primary Care Provider:  [name] Other Clinician: Referring Provider: Treating Provider/Extender: [name], [name] in Treatment: 0 Clinic Level of Care Assessment Items TOOL 2 Quantity Score []  - 0 Use when only an EandM is performed on the INITIAL visit ASSESSMENTS - Nursing Assessment / Reassessment X- 1 20 General Physical Exam (combine w/ comprehensive assessment (listed just below) when performed on new pt. evals) X- 1 25 Comprehensive Assessment (HX, ROS, Risk Assessments, Wounds Hx, etc.) ASSESSMENTS - Wound and Skin A ssessment / Reassessment X - Simple Wound Assessment / Reassessment - one wound 1 5 []  - 0 Complex Wound Assessment / Reassessment - multiple wounds []  - 0 Dermatologic / Skin Assessment (not related to wound area) ASSESSMENTS - Ostomy and/or Continence Assessment and Care []  - 0 Incontinence Assessment and Management []  - 0 Ostomy Care Assessment and Management (repouching, etc.) PROCESS - Coordination of Care X - Simple Patient / Family Education for ongoing care 1 15 []  - 0 Complex (extensive) Patient / Family Education for ongoing care X- 1 10 Staff obtains , Records, T Results / Process Orders est []  - 0 Staff telephones HHA, Nursing Homes / Clarify orders / etc []  - 0 Routine Transfer to another Facility (non-emergent condition) []  - 0 Routine Hospital Admission (non-emergent condition) X- 1 15 New Admissions / / Ordering NPWT Apligraf, etc. , []  - 0 Emergency Hospital Admission (emergent condition) X- 1 10 Simple Discharge Coordination []  - 0 Complex (extensive) Discharge Coordination PROCESS - Special Needs []  - 0 Pediatric / Minor Patient Management []  - 0 Isolation Patient Management []  - 0 Hearing / Language / Visual special needs []  - 0 Assessment of Community assistance (transportation, D/C planning, etc.) []  - 0 Additional assistance / Altered mentation []  - 0 Support Surface(s)  Assessment (bed, cushion, seat, etc.) INTERVENTIONS - Wound Cleansing / Measurement X- 1 5 Wound Imaging (photographs - any number of wounds) []  - 0 Wound Tracing (instead of photographs) X- 1 5 Simple Wound Measurement - one wound []  - 0 Complex Wound Measurement - multiple wounds  X- 1 5 Simple Wound Cleansing - one wound []  - 0 Complex Wound Cleansing - multiple wounds INTERVENTIONS - Wound Dressings X - Small Wound Dressing one or multiple wounds 1 10 []  - 0 Medium Wound Dressing one or multiple wounds []  - 0 Large Wound Dressing one or multiple wounds []  - 0 Application of Medications - injection INTERVENTIONS - Miscellaneous []  - 0 External ear exam []  - 0 Specimen Collection (cultures, biopsies, blood, body fluids, etc.) []  - 0 Specimen(s) / Culture(s) sent or taken to Lab for analysis []  - 0 Patient Transfer (multiple staff / / Similar devices) []  - 0 Simple Staple / Suture removal (25 or less) []  - 0 Complex Staple / Suture removal (26 or more) []  - 0 Hypo / Hyperglycemic Management (close monitor of Blood Glucose) []  - 0 Ankle / Brachial Index (ABI) - do not check if billed separately Has the patient been seen at the hospital within the last three years: Yes Total Score: 125 Level Of Care: New/Established - Level 4 Electronic Signature(s) Signed: 11/11/2020 5:21:35 PM By: RN, BSN Entered By: on 11/11/2020 09:43:40 -------------------------------------------------------------------------------- Lower Extremity Assessment Details Patient Name: Date of Service: Benjamin [name], R. 11/11/2020 9:00 A M Medical Record Number: Nurse, adult Patient Account Number: Date of Birth/Sex: Treating RN: 1965/10/22 (55 y.o. Primary Care Provider: 11/13/2020 Other Clinician: Referring Provider: Treating Provider/Extender: [name], [name] in Treatment: 0 Electronic  Signature(s) Signed: 11/11/2020 5:17:36 PM By: 11/13/2020 Entered By: [facility] on 11/11/2020 08:52:11 -------------------------------------------------------------------------------- Multi-Disciplinary Care Plan Details Patient Name: Date of Service: 11/13/2020 [name], [id number] R. 11/11/2020 9:00 A M Medical Record Number: 11/10/1965 Patient Account Number: [id number] Date of Birth/Sex: Treating RN: October 05, 1965 (55 y.o. [name] Primary Care Provider: 11/13/2020 Other Clinician: Referring Provider: Treating Provider/Extender: [name] Weeks in Treatment: 0 Multidisciplinary Care Plan reviewed with physician Active Inactive Wound/Skin Impairment Nursing Diagnoses: Impaired tissue integrity Knowledge deficit related to ulceration/compromised skin integrity Goals: Patient/caregiver will verbalize understanding of skin care regimen Date Initiated: 11/11/2020 Target Resolution Date: 12/09/2020 Goal Status: Active Ulcer/skin breakdown will have a volume reduction of 30% by week 4 Date Initiated: 11/11/2020 Target Resolution Date: 12/09/2020 Goal Status: Active Interventions: Assess patient/caregiver ability to obtain necessary supplies Assess patient/caregiver ability to perform ulcer/skin care regimen upon admission and as needed Assess ulceration(s) every visit Provide education on ulcer and skin care Treatment Activities: Skin care regimen initiated : 11/11/2020 Topical wound management initiated : 11/11/2020 Notes: Electronic Signature(s) Signed: 11/11/2020 5:21:35 PM By: 11/10/1965 RN, BSN Entered By: 53 on 11/11/2020 09:00:43 -------------------------------------------------------------------------------- Pain Assessment Details Patient Name: Date of Service: [name] [name], [name] R. 11/11/2020 9:00 A M Medical Record Number: 12/11/2020 Patient Account Number: 11/13/2020 Date of Birth/Sex: Treating RN: Jan 27, 1966 (55 y.o. 11/13/2020 Primary Care Provider: 11/13/2020 Other Clinician: Referring Provider: Treating Provider/Extender: [name], [name] Weeks in Treatment: 0 Active Problems Location of Pain Severity and Description of Pain Patient Has Paino No Site Locations Rate the pain. Current Pain Level: 0 Pain Management and Medication Current Pain Management: Medication: No Cold Application: No Rest: No Massage: No Activity: No T.E.N.S.: No Heat Application: No Leg drop or elevation: No Is the Current Pain Management Adequate: Adequate How does your wound impact your activities of daily livingo Sleep: No Bathing: No Appetite: No Relationship With Others: No Bladder Continence: No Emotions: No  Bowel Continence: No Work: No Toileting: No Drive: No Dressing: No Hobbies: No Electronic Signature(s) Signed: 11/11/2020 5:17:36 PM By: [name] Entered By: [name] on 11/11/2020 08:52:22 -------------------------------------------------------------------------------- Patient/Caregiver Education Details Patient Name: Date of Service: Benjamin [name], TIMO THY R. 2/23/2022andnbsp9:00 A M Medical Record Number: [id number] Patient Account Number: [id number] Date of Birth/Gender: Treating RN: Apr 07, 1966 (55 y.o. Benjamin [name] Primary Care Physician: [name] Other Clinician: Referring Physician: Treating Physician/Extender: Merdis Delay in Treatment: 0 Education Assessment Education Provided To: Patient Education Topics Provided [facility]: o Handouts: Welcome [facility] o Methods: Explain/Verbal, Printed Responses: Reinforcements needed, State content correctly Wound/Skin Impairment: Handouts: Caring for Your Ulcer, Skin Care Do's and Dont's Methods: Explain/Verbal, Printed Responses: Reinforcements needed, State content correctly Electronic Signature(s) Signed: 11/11/2020 5:21:35 PM By:  [name] RN, BSN Entered By: [name] on 11/11/2020 09:01:26 -------------------------------------------------------------------------------- Wound Assessment Details Patient Name: Date of Service: Benjamin [name] R. 11/11/2020 9:00 A M Medical Record Number: [id number] Patient Account Number: [id number] Date of Birth/Sex: Treating RN: 12/23/1965 (55 y.o. [name] Primary Care Provider: [name] Other Clinician: Referring Provider: Treating Provider/Extender: [name], [name] in Treatment: 0 Wound Status Wound Number: 1 Primary Inflammatory Etiology: Wound Location: Right Abdomen - Lower Quadrant Wound Open Wounding Event: Bite Status: Date Acquired: 10/28/2020 Comorbid Anemia, Sleep Apnea, Arrhythmia, Congestive Heart Failure, Weeks Of Treatment: 0 History: Hypertension, Type II Diabetes Clustered Wound: No Wound Measurements Length: (cm) 2.2 Width: (cm) 2.1 Depth: (cm) 0.1 Area: (cm) 3.629 Volume: (cm) 0.363 % Reduction in Area: % Reduction in Volume: Epithelialization: Medium (34-66%) Tunneling: No Undermining: No Wound Description Classification: Full Thickness Without Exposed Support Structures Wound Margin: Distinct, outline attached Exudate Amount: Medium Exudate Type: Serosanguineous Exudate Color: red, brown Foul Odor After Cleansing: No Slough/Fibrino Yes Wound Bed Granulation Amount: Large (67-100%) Exposed Structure Granulation Quality: Pink, Pale Fascia Exposed: No Necrotic Amount: Small (1-33%) Fat Layer (Subcutaneous Tissue) Exposed: Yes Necrotic Quality: Adherent Slough Tendon Exposed: No Muscle Exposed: No Joint Exposed: No Bone Exposed: No Electronic Signature(s) Signed: 11/11/2020 5:17:36 PM By: [name] Entered By: [name] on 11/11/2020 08:58:27 -------------------------------------------------------------------------------- Vitals Details Patient Name: Date of  Service: Benjamin [name], Benjamin Gain R. 11/11/2020 9:00 A M Medical Record Number: [id number] Patient Account Number: [id number] Date of Birth/Sex: Treating RN: [birth year] (55 y.o. [name] Primary Care Provider: [name] Other Clinician: Referring Provider: Treating Provider/Extender: [name], [name] in Treatment: 0 Vital Signs Time Taken: 08:43 Temperature (F): 98.3 Height (in): 71 Pulse (bpm): 91 Source: Stated Respiratory Rate (breaths/min): 18 Weight (lbs): 350 Blood Pressure (mmHg): 168/92 Source: Stated Capillary Blood Glucose (mg/dl): 97 Body Mass Index (BMI): 48.8 Reference Range: 80 - 120 mg / dl Electronic Signature(s) Signed: 11/11/2020 5:17:36 PM By: [name] Entered By: [name] on 11/11/2020 08:47:40

## 2020-11-11 NOTE — Progress Notes (Signed)
Benjamin, [name] ([id number]) Visit Report for 11/11/2020 Abuse/Suicide Risk Screen Details Patient Name: Date of Service: Benjamin [name] 11/11/2020 9:00 A M Medical Record Number: [id number] Patient Account Number: [id number] Date of Birth/Sex: Treating RN: Oct 01, 1965 (55 y.o. [name] Primary Care Provider: [name] Other Clinician: Referring Provider: Treating Provider/Extender: [name], [name] in Treatment: 0 Abuse/Suicide Risk Screen Items Answer ABUSE RISK SCREEN: Has anyone close to you tried to hurt or harm you recentlyo No Do you feel uncomfortable with anyone in your familyo No Has anyone forced you do things that you didnt want to doo No Electronic Signature(s) Signed: 11/11/2020 5:17:36 PM By: [name] Entered By: [name] on 11/11/2020 08:49:52 -------------------------------------------------------------------------------- Activities of Daily Living Details Patient Name: Date of Service: Benjamin [name] 11/11/2020 9:00 A M Medical Record Number: [id number] Patient Account Number: [id number] Date of Birth/Sex: Treating RN: 1965-09-29 (55 y.o. [name] Primary Care Provider: [name] Other Clinician: Referring Provider: Treating Provider/Extender: [name], [name] in Treatment: 0 Activities of Daily Living Items Answer Activities of Daily Living (Please select one for each item) Drive Automobile Completely Able T Medications ake Completely Able Use T elephone Completely Able Care for Appearance Completely Able Use T oilet Completely Able Bath / Shower Completely Able Dress Self Completely Able Feed Self Completely Able Walk Completely Able Get In / Out Bed Completely Able Housework Completely Able Prepare Meals Completely Able Handle Money Completely Able Shop for Self Completely Able Electronic Signature(s) Signed: 11/11/2020 5:17:36 PM By: [name] Entered  By: [name] on 11/11/2020 08:50:11 -------------------------------------------------------------------------------- Education Screening Details Patient Name: Date of Service: Benjamin Blalock MS, Benjamin Gain R. 11/11/2020 9:00 A M Medical Record Number: [id number] Patient Account Number: [id number] Date of Birth/Sex: Treating RN: December 01, 1965 (55 y.o. [name] Primary Care Provider: [name] Other Clinician: Referring Provider: Treating Provider/Extender: [name] Weeks in Treatment: 0 Primary Learner Assessed: Patient Learning Preferences/Education Level/Primary Language Learning Preference: Explanation, Demonstration, Printed Material Highest Education Level: Grade School Preferred Language: English Cognitive Barrier Language Barrier: No Translator Needed: No Memory Deficit: No Emotional Barrier: No Cultural/Religious Beliefs Affecting Medical Care: No Physical Barrier Impaired Vision: No Impaired Hearing: No Decreased Hand dexterity: No Knowledge/Comprehension Knowledge Level: High Comprehension Level: High Ability to understand written instructions: High Ability to understand verbal instructions: High Motivation Anxiety Level: Calm Cooperation: Cooperative Education Importance: Acknowledges Need Interest in Health Problems: Asks Questions Perception: Coherent Willingness to Engage in Self-Management High Activities: Readiness to Engage in Self-Management High Activities: Electronic Signature(s) Signed: 11/11/2020 5:17:36 PM By: [name] Entered By: [name] on 11/11/2020 08:51:24 -------------------------------------------------------------------------------- Fall Risk Assessment Details Patient Name: Date of Service: Benjamin Blalock MS, Benjamin Levonne Lapping R. 11/11/2020 9:00 A M Medical Record Number: [id number] Patient Account Number: [id number] Date of Birth/Sex: Treating RN: 02/04/1966 (55 y.o. [name] Primary Care Provider:  [name] Other Clinician: Referring Provider: Treating Provider/Extender: [name], [name] Weeks in Treatment: 0 Fall Risk Assessment Items Have you had 2 or more falls in the last 12 monthso 0 No Have you had any fall that resulted in injury in the last 12 monthso 0 Yes FALLS RISK SCREEN History of falling - immediate or within 3 months 25 Yes Secondary diagnosis (Do you have 2 or more medical diagnoseso) 0 No Ambulatory aid None/bed rest/wheelchair/nurse 0 Yes Crutches/cane/walker 0 No Furniture 0 No Intravenous therapy Access/Saline/Heparin Lock 0 No Gait/Transferring Normal/ bed rest/ wheelchair  0 Yes Weak (short steps with or without shuffle, stooped but able to lift head while walking, may seek 0 No support from furniture) Impaired (short steps with shuffle, may have difficulty arising from chair, head down, impaired 0 No balance) Mental Status Oriented to own ability 0 Yes Electronic Signature(s) Signed: 11/11/2020 5:17:36 PM By: [name] Entered By: [name] on 11/11/2020 08:51:00 -------------------------------------------------------------------------------- Foot Assessment Details Patient Name: Date of Service: Benjamin Farmer R. 11/11/2020 9:00 A M Medical Record Number: [id number] Patient Account Number: [id number] Date of Birth/Sex: Treating RN: [birth year] (55 y.o. [name] Primary Care Provider: [name] Other Clinician: Referring Provider: Treating Provider/Extender: [name], [name] in Treatment: 0 Foot Assessment Items Site Locations + = Sensation present, - = Sensation absent, C = Callus, U = Ulcer R = Redness, W = Warmth, M = Maceration, PU = Pre-ulcerative lesion F = Fissure, S = Swelling, D = Dryness Assessment Right: Left: Other Deformity: No No Prior Foot Ulcer: No No Prior Amputation: No No Charcot Joint: No No Ambulatory Status: Gait: Steady Notes no wounds to  BLE. Electronic Signature(s) Signed: 11/11/2020 5:17:36 PM By: [name] Entered By: [name] on 11/11/2020 08:52:07 -------------------------------------------------------------------------------- Nutrition Risk Screening Details Patient Name: Date of Service: Benjamin [name]. 11/11/2020 9:00 A M Medical Record Number: [id number] Patient Account Number: [id number] Date of Birth/Sex: Treating RN: 08/27/1966 (56 y.o. [name] Primary Care Provider: [name] Other Clinician: Referring Provider: Treating Provider/Extender: [name], [name] in Treatment: 0 Height (in): 71 Weight (lbs): 350 Body Mass Index (BMI): 48.8 Nutrition Risk Screening Items Score Screening NUTRITION RISK SCREEN: I have an illness or condition that made me change the kind and/or amount of food I eat 2 Yes I eat fewer than two meals per day 0 No I eat few fruits and vegetables, or milk products 0 No I have three or more drinks of beer, liquor or wine almost every day 0 No I have tooth or mouth problems that make it hard for me to eat 0 No I don't always have enough money to buy the food I need 0 No I eat alone most of the time 0 No I take three or more different prescribed or over-the-counter drugs a day 1 Yes Without wanting to, I have lost or gained 10 pounds in the last six months 0 No I am not always physically able to shop, cook and/or feed myself 0 No Nutrition Protocols Good Risk Protocol Provide education on elevated blood Moderate Risk Protocol 0 sugars and impact on wound healing, as applicable High Risk Proctocol Risk Level: Moderate Risk Score: 3 Electronic Signature(s) Signed: 11/11/2020 5:17:36 PM By: [name] Entered By: [name] on 11/11/2020 08:51:40

## 2020-11-16 ENCOUNTER — Other Ambulatory Visit: Payer: Self-pay | Admitting: Nurse Practitioner

## 2020-11-16 MED ORDER — MISC. DEVICES MISC: 0 refills | Status: DC

## 2020-11-17 ENCOUNTER — Other Ambulatory Visit (HOSPITAL_COMMUNITY): Payer: Self-pay

## 2020-11-17 NOTE — Progress Notes (Signed)
Paramedicine Encounter    Patient ID: Benjamin [name], male    DOB: Dec 27, 1965, 55 y.o.   MRN: [id number]   Patient Care Team: [name], NP as PCP - General (Nurse Practitioner) [name], MD as PCP - Cardiology (Cardiology) [name], LCSW as Social Worker (Licensed Clinical Social Worker)  Patient Active Problem List   Diagnosis Date Noted  . Polysubstance abuse ([place]) 08/20/2020  . Alcohol abuse 08/20/2020  . Atrial fibrillation with rapid ventricular response ([place]) 08/19/2020  . Obesity hypoventilation syndrome ([place]) 06/12/2020  . Healthcare maintenance 06/12/2020  . Acute on chronic diastolic heart failure ([place]) 02/24/2020  . Diabetic neuropathy ([place]) 11/20/2019  . Coagulation disorder ([place]) 11/20/2019  . Acute on chronic diastolic CHF (congestive heart failure), NYHA class 3 ([place]) 07/30/2019  . Diabetes mellitus without complication ([place]) 75/88/3254  . Hypertensive emergency   . BRBPR (bright red blood per rectum) 06/12/2019  . Hypertension   . CHF (congestive heart failure) ([place])   . Sleep apnea   . Chronic respiratory failure ([place])   . Hypokalemia 05/29/2019  . GI bleed 05/29/2019  . Anemia 05/28/2019  . Acute on chronic diastolic CHF (congestive heart failure) ([place]) 04/11/2019  . Iron deficiency anemia 03/11/2019  . OSA on CPAP 03/11/2019  . HLD (hyperlipidemia) 03/11/2019  . Elevated troponin 03/11/2019  . GERD (gastroesophageal reflux disease) 03/11/2019  . Rectal bleeding 02/20/2019  . Dyspnea 02/06/2019  . COVID-19 virus infection 02/06/2019  . Bilateral lower extremity edema   . Morbid obesity with BMI of 50.0-59.9, adult ([place])   . Chronic diastolic CHF (congestive heart failure) ([place])   . PAF (paroxysmal atrial fibrillation) ([place])   . PE (pulmonary thromboembolism) ([place]) 01/21/2019  . Hypertensive urgency 01/21/2019  . Diabetes mellitus type 2 in obese ([place]) 01/21/2019    Current Outpatient Medications:  .  atorvastatin (LIPITOR) 40 MG  tablet, Take 1 tablet (40 mg total) by mouth at bedtime., Disp: 90 tablet, Rfl: 2 .  Blood Glucose Monitoring Suppl (TRUE METRIX METER) w/Device KIT, Use as instructed. Check blood glucose level by fingerstick twice per day., Disp: 1 kit, Rfl: 0 .  Blood Pressure Monitor DEVI, Please provide patient with insurance approved blood pressure device with L-XL cuff. BMI 55 (Patient taking differently: 1 each by Other route See admin instructions. Please provide patient with insurance approved blood pressure device with L-XL cuff. BMI 55), Disp: 1 each, Rfl: 0 .  carboxymethylcellulose (REFRESH PLUS) 0.5 % SOLN, Place 1 drop into both eyes daily., Disp: , Rfl:  .  diltiazem (CARDIZEM CD) 240 MG 24 hr capsule, Take 1 capsule (240 mg total) by mouth daily., Disp: 30 capsule, Rfl: 3 .  docusate sodium (COLACE) 100 MG capsule, Take 1 capsule (100 mg total) by mouth 2 (two) times daily., Disp: 180 capsule, Rfl: 2 .  doxazosin (CARDURA) 2 MG tablet, Take 1 tablet (2 mg total) by mouth daily., Disp: 30 tablet, Rfl: 11 .  glucose blood (TRUE METRIX BLOOD GLUCOSE TEST) test strip, Use as instructed. Check blood glucose level by fingerstick twice per day., Disp: 100 each, Rfl: 12 .  hydrALAZINE (APRESOLINE) 50 MG tablet, Take 1 tablet (50 mg total) by mouth 3 (three) times daily., Disp: 90 tablet, Rfl: 3 .  labetalol (NORMODYNE) 200 MG tablet, Take 1 tablet (200 mg total) by mouth 2 (two) times daily., Disp: 60 tablet, Rfl: 0 .  magnesium oxide (MAG-OX) 400 (241.3 Mg) MG tablet, TAKE 2 TABLETS BY MOUTH EVERY MORNING  AND 1 TABLET EVERY EVENING NOON+PM+BEDTIME), Disp: 90 tablet, Rfl: 3 .  metFORMIN (GLUCOPHAGE) 500 MG tablet, TAKE ONE TABLET BY MOUTH TWICE DAILY WITH A MEAL (NOON+BEDTIME), Disp: 180 tablet, Rfl: 0 .  Misc. Devices MISC, Requires O2 @ 3L/min continuously via nasal canula and home fill system,. (Patient taking differently: 1 each by Other route See admin instructions. Requires O2 @ 3L/min continuously via  nasal canula and home fill system,.), Disp: 1 each, Rfl: 0 .  Misc. Devices MISC, Please provide Mr. Shabazz with insurance approved portable O2 concentrator ICD 10 J96.11 [phone] (Patient taking differently: 1 each by Other route See admin instructions. Please provide Mr. Platz with insurance approved portable O2 concentrator ICD 10 J96.11 Z99.81), Disp: 1 each, Rfl: 0 .  Misc. Devices MISC, Please provide patient with insurance approved LARGE right hand splint ICD 10 G56.01, Disp: 1 each, Rfl: 0 .  omeprazole (PRILOSEC) 20 MG capsule, TAKE 1 CAPSULE BY MOUTH 2 (TWO) TIMES DAILY BEFORE A MEAL. TAKE FIRST CAPSULE 30 MIN PRIOR TO EATING OR TAKING OTHER MEDICATIONS. (AM+BEDTIME), Disp: 180 capsule, Rfl: 2 .  potassium chloride (KLOR-CON) 10 MEQ tablet, Take 8 tablets (80 mEq total) by mouth daily., Disp: 240 tablet, Rfl: 3 .  PROVENTIL HFA 108 (90 Base) MCG/ACT inhaler, INHALE 2 PUFFS BY MOUTH EVERY 6 (SIX) HOURS AS NEEDED FOR WHEEZING OR SHORTNESS OF BREATH., Disp: 18 g, Rfl: 6 .  spironolactone (ALDACTONE) 50 MG tablet, TAKE 1 TABLET (50 MG TOTAL) BY MOUTH DAILY (NOON), Disp: 30 tablet, Rfl: 3 .  torsemide (DEMADEX) 20 MG tablet, Take 69m by mouth twice daily, Disp: 180 tablet, Rfl: 3 .  TRUEplus Lancets 28G MISC, Use as instructed. Check blood glucose level by fingerstick twice per day., Disp: 200 each, Rfl: 3 .  XARELTO 20 MG TABS tablet, TAKE 1 TABLET BY MOUTH DAILY WITH SUPPER (BEDTIME), Disp: 60 tablet, Rfl: 6 .  amoxicillin-clavulanate (AUGMENTIN) 875-125 MG tablet, Take 1 tablet by mouth 2 (two) times daily. (Patient not taking: No sig reported), Disp: 14 tablet, Rfl: 0 .  ferrous sulfate 325 (65 FE) MG tablet, Take 1 tablet (325 mg total) by mouth daily., Disp: 90 tablet, Rfl: 2 .  losartan (COZAAR) 100 MG tablet, Take 1 tablet (100 mg total) by mouth daily. (Patient not taking: No sig reported), Disp: 30 tablet, Rfl: 11 .  Lumateperone Tosylate (CAPLYTA) 42 MG CAPS, Take 42 mg by mouth at  bedtime. (Patient not taking: No sig reported), Disp: 30 capsule, Rfl:  No Known Allergies    Social History   Socioeconomic History  . Marital status: Single    Spouse name: Not on file  . Number of children: Not on file  . Years of education: Not on file  . Highest education level: Not on file  Occupational History  . Not on file  Tobacco Use  . Smoking status: Former SResearch scientist (life sciences) . Smokeless tobacco: Never Used  . Tobacco comment: smoked weed in the past  Vaping Use  . Vaping Use: Never used  Substance and Sexual Activity  . Alcohol use: Not Currently  . Drug use: Not Currently    Types: Marijuana  . Sexual activity: Not Currently  Other Topics Concern  . Not on file  Social History Narrative  . Not on file   Social Determinants of Health   Financial Resource Strain: Medium Risk  . Difficulty of Paying Living Expenses: Somewhat hard  Food Insecurity: Food Insecurity Present  . Worried About REstate manager/land agent  of Food in the Last Year: Sometimes true  . Ran Out of Food in the Last Year: Sometimes true  Transportation Needs: No Transportation Needs  . Lack of Transportation (Medical): No  . Lack of Transportation (Non-Medical): No  Physical Activity: Not on file  Stress: Not on file  Social Connections: Not on file  Intimate Partner Violence: Not on file    Physical Exam      Future Appointments  Date Time Provider [place]  11/18/2020 10:00 AM Jeri Cos [place] III, PA-C [facility]  11/25/2020 10:15 AM Felipa Furnace, DPM [facility]  11/25/2020  8:00 PM [name], MD [facility]  12/21/2020  9:50 AM [name], NP [facility] None  01/14/2021 11:30 AM [facility] PA/NP [facility] None    BP (!) 184/0   Pulse 88   Resp 20   SpO2 95%   Weight yesterday-353 Last visit weight-354  I p/u his potassium prior to appointment.  He has 2 wks left of bubble packs.  Reviewed his upcoming appointments and he called [facility] to schedule.  He has sleep  study next week but says he can get a ride there.  Not using CPAP-hes scared he is going to get cancer from using the recalled device, I advised him the risk benefit is not in his favor so he needs to continue to use it, he has sleep study next week and will get new cpap soon hopefully.   Got him to sign the papers for housing for jenna.  I will return those to her tomorrow.   [name], [facility] Paramedic  11/17/20

## 2020-11-18 ENCOUNTER — Other Ambulatory Visit: Payer: Self-pay

## 2020-11-18 ENCOUNTER — Encounter (HOSPITAL_BASED_OUTPATIENT_CLINIC_OR_DEPARTMENT_OTHER): Payer: Medicaid Other | Attending: Physician Assistant | Admitting: Physician Assistant

## 2020-11-18 DIAGNOSIS — I480 Paroxysmal atrial fibrillation: Secondary | ICD-10-CM | POA: Diagnosis not present

## 2020-11-18 DIAGNOSIS — L98492 Non-pressure chronic ulcer of skin of other sites with fat layer exposed: Secondary | ICD-10-CM | POA: Insufficient documentation

## 2020-11-18 DIAGNOSIS — I5042 Chronic combined systolic (congestive) and diastolic (congestive) heart failure: Secondary | ICD-10-CM | POA: Diagnosis not present

## 2020-11-18 DIAGNOSIS — Z9981 Dependence on supplemental oxygen: Secondary | ICD-10-CM | POA: Diagnosis not present

## 2020-11-18 DIAGNOSIS — I110 Hypertensive heart disease with heart failure: Secondary | ICD-10-CM | POA: Insufficient documentation

## 2020-11-18 DIAGNOSIS — E1151 Type 2 diabetes mellitus with diabetic peripheral angiopathy without gangrene: Secondary | ICD-10-CM | POA: Insufficient documentation

## 2020-11-18 DIAGNOSIS — E11622 Type 2 diabetes mellitus with other skin ulcer: Secondary | ICD-10-CM | POA: Insufficient documentation

## 2020-11-18 DIAGNOSIS — Z7901 Long term (current) use of anticoagulants: Secondary | ICD-10-CM | POA: Diagnosis not present

## 2020-11-18 NOTE — Progress Notes (Signed)
Benjamin [name] ([id number]) Visit Report for 11/18/2020 Arrival Information Details Patient Name: Date of Service: Benjamin [name] 11/18/2020 10:00 A M Medical Record Number: [id number] Patient Account Number: [id number] Date of Birth/Sex: Treating RN: 07-17-1966 (55 y.o. Benjamin [name] Primary Care Provider: [name] Other Clinician: Referring Provider: Treating Provider/Extender: [name] Weeks in Treatment: 1 Visit Information History Since Last Visit Added or deleted any medications: No Patient Arrived: Ambulatory Any new allergies or adverse reactions: No Arrival Time: 10:20 Had a fall or experienced change in No Accompanied By: alone activities of daily living that may affect Transfer Assistance: None risk of falls: Patient Identification Verified: Yes Signs or symptoms of abuse/neglect since last visito No Secondary Verification Process Completed: Yes Hospitalized since last visit: No Patient Requires Transmission-Based Precautions: No Implantable device outside of the clinic excluding No Patient Has Alerts: Yes cellular tissue based products placed in the center Patient Alerts: Patient on Blood Thinner since last visit: Has Dressing in Place as Prescribed: Yes Pain Present Now: No Electronic Signature(s) Signed: 11/18/2020 2:59:34 PM By: Zandra Abts RN, BSN Entered By: Zandra Abts on 11/18/2020 10:20:36 -------------------------------------------------------------------------------- Clinic Level of Care Assessment Details Patient Name: Date of Service: [facility] MS, Benjamin Gain R. 11/18/2020 10:00 A M Medical Record Number: [id number] Patient Account Number: [id number] Date of Birth/Sex: Treating RN: [birth year] (55 y.o. Benjamin [name] Primary Care Provider: [name] Other Clinician: Referring Provider: Treating Provider/Extender: [name], [name] Weeks in Treatment: 1 Clinic Level of Care Assessment  Items TOOL 4 Quantity Score X- 1 0 Use when only an EandM is performed on FOLLOW-UP visit ASSESSMENTS - Nursing Assessment / Reassessment X- 1 10 Reassessment of Co-morbidities (includes updates in patient status) X- 1 5 Reassessment of Adherence to Treatment Plan ASSESSMENTS - Wound and Skin A ssessment / Reassessment X - Simple Wound Assessment / Reassessment - one wound 1 5 []  - 0 Complex Wound Assessment / Reassessment - multiple wounds []  - 0 Dermatologic / Skin Assessment (not related to wound area) ASSESSMENTS - Focused Assessment []  - 0 Circumferential Edema Measurements - multi extremities []  - 0 Nutritional Assessment / Counseling / Intervention []  - 0 Lower Extremity Assessment (monofilament, tuning fork, pulses) []  - 0 Peripheral Arterial Disease Assessment (using hand held doppler) ASSESSMENTS - Ostomy and/or Continence Assessment and Care []  - 0 Incontinence Assessment and Management []  - 0 Ostomy Care Assessment and Management (repouching, etc.) PROCESS - Coordination of Care X - Simple Patient / Family Education for ongoing care 1 15 []  - 0 Complex (extensive) Patient / Family Education for ongoing care X- 1 10 Staff obtains , Records, T Results / Process Orders est []  - 0 Staff telephones HHA, Nursing Homes / Clarify orders / etc []  - 0 Routine Transfer to another Facility (non-emergent condition) []  - 0 Routine Hospital Admission (non-emergent condition) []  - 0 New Admissions / / Ordering NPWT Apligraf, etc. , []  - 0 Emergency Hospital Admission (emergent condition) X- 1 10 Simple Discharge Coordination []  - 0 Complex (extensive) Discharge Coordination PROCESS - Special Needs []  - 0 Pediatric / Minor Patient Management []  - 0 Isolation Patient Management []  - 0 Hearing / Language / Visual special needs []  - 0 Assessment of Community assistance (transportation, D/C planning, etc.) []  - 0 Additional  assistance / Altered mentation []  - 0 Support Surface(s) Assessment (bed, cushion, seat, etc.) INTERVENTIONS - Wound Cleansing / Measurement X - Simple Wound Cleansing - one  wound 1 5 []  - 0 Complex Wound Cleansing - multiple wounds []  - 0 Wound Imaging (photographs - any number of wounds) []  - 0 Wound Tracing (instead of photographs) X- 1 5 Simple Wound Measurement - one wound []  - 0 Complex Wound Measurement - multiple wounds INTERVENTIONS - Wound Dressings X - Small Wound Dressing one or multiple wounds 1 10 []  - 0 Medium Wound Dressing one or multiple wounds []  - 0 Large Wound Dressing one or multiple wounds []  - 0 Application of Medications - topical []  - 0 Application of Medications - injection INTERVENTIONS - Miscellaneous []  - 0 External ear exam []  - 0 Specimen Collection (cultures, biopsies, blood, body fluids, etc.) []  - 0 Specimen(s) / Culture(s) sent or taken to Lab for analysis []  - 0 Patient Transfer (multiple staff / / Similar devices) []  - 0 Simple Staple / Suture removal (25 or less) []  - 0 Complex Staple / Suture removal (26 or more) []  - 0 Hypo / Hyperglycemic Management (close monitor of Blood Glucose) []  - 0 Ankle / Brachial Index (ABI) - do not check if billed separately X- 1 5 Vital Signs Has the patient been seen at the hospital within the last three years: Yes Total Score: 80 Level Of Care: New/Established - Level 3 Electronic Signature(s) Signed: 11/18/2020 2:59:34 PM By: RN, BSN Entered By: on 11/18/2020 10:26:01 -------------------------------------------------------------------------------- Encounter Discharge Information Details Patient Name: Date of Service: MS, R. 11/18/2020 10:00 A M Medical Record Number: Patient Account Number: Date of Birth/Sex: Treating RN: 1965-12-29 (55 y.o. Nurse, adult Primary Care Provider: Other  Clinician: Referring Provider: Treating Provider/Extender: Weeks in Treatment: 1 Encounter Discharge Information Items Discharge Condition: Stable Ambulatory Status: Ambulatory Discharge Destination: Home Transportation: Private Auto Accompanied By: alone Schedule Follow-up Appointment: Yes Clinical Summary of Care: Patient Declined Electronic Signature(s) Signed: 11/18/2020 2:59:34 PM By: RN, BSN Entered By: 01/18/2021 on 11/18/2020 10:26:55 -------------------------------------------------------------------------------- Wound Assessment Details Patient Name: Date of Service: Zandra Abts R. 11/18/2020 10:00 A M Medical Record Number: Orlinda Blalock Patient Account Number: Benjamin Gain Date of Birth/Sex: Treating RN: 05-24-1966 (55 y.o. [id number] Primary Care Provider: 11/10/1965 Other Clinician: Referring Provider: Treating Provider/Extender: 53, [name] in Treatment: 1 Wound Status Wound Number: 1 Primary Inflammatory Etiology: Wound Location: Right Abdomen - Lower Quadrant Wound Open Wounding Event: Bite Status: Date Acquired: 10/28/2020 Comorbid Anemia, Sleep Apnea, Arrhythmia, Congestive Heart Failure, Weeks Of Treatment: 1 History: Hypertension, Type II Diabetes Clustered Wound: No Wound Measurements Length: (cm) 2.2 Width: (cm) 2.1 Depth: (cm) 0.1 Area: (cm) 3.629 Volume: (cm) 0.363 Wound Description Classification: Full Thickness Without Exposed Support St Wound Margin: Distinct, outline attached Exudate Amount: Medium Exudate Type: Serosanguineous Exudate Color: red, brown ructures Foul Odor After Cleansing: Slough/Fibrino % Reduction in Area: 0% % Reduction in Volume: 0% Epithelialization: Medium (34-66%) Tunneling: No Undermining: No No Yes Wound Bed Granulation Amount: Large (67-100%) Exposed Structure Granulation Quality: Pink, Pale Fascia Exposed:  No Necrotic Amount: Small (1-33%) Fat Layer (Subcutaneous Tissue) Exposed: Yes Necrotic Quality: Adherent Slough Tendon Exposed: No Muscle Exposed: No Joint Exposed: No Bone Exposed: No Treatment Notes Wound #1 (Abdomen - Lower Quadrant) Wound Laterality: Right Cleanser Peri-Wound Care Topical Primary Dressing PolyMem Non-Adhesive Dressing, 4x4 in Discharge Instruction: Apply to wound bed as instructed Secondary Dressing ComfortFoam Border, 4x4 in (silicone border) Discharge Instruction: Apply over primary  dressing as directed. Secured With Compression Wrap Compression Stockings Facilities manager) Signed: 11/18/2020 2:59:34 PM By: Zandra Abts RN, BSN Entered By: Zandra Abts on 11/18/2020 10:25:36 -------------------------------------------------------------------------------- Vitals Details Patient Name: Date of Service: [name], Benjamin Gain R. 11/18/2020 10:00 A M Medical Record Number: [id number] Patient Account Number: [id number] Date of Birth/Sex: Treating RN: 1966-03-13 (55 y.o. Benjamin [name] Primary Care Provider: [name] Other Clinician: Referring Provider: Treating Provider/Extender: [name], [name] in Treatment: 1 Vital Signs Time Taken: 10:20 Temperature (F): 98.3 Height (in): 71 Pulse (bpm): 91 Weight (lbs): 350 Respiratory Rate (breaths/min): 18 Body Mass Index (BMI): 48.8 Blood Pressure (mmHg): 165/110 Capillary Blood Glucose (mg/dl): 97 Reference Range: 80 - 120 mg / dl Notes glucose per pt report Electronic Signature(s) Signed: 11/18/2020 2:59:34 PM By: Zandra Abts RN, BSN Entered By: Zandra Abts on 11/18/2020 10:21:03

## 2020-11-19 NOTE — Progress Notes (Signed)
MACARI, ZALESKY ([id number]) Visit Report for 11/18/2020 SuperBill Details Patient Name: Date of Service: Benjamin [name] M[place] July 11/18/2020 Medical Record Number: [id number] Patient Account Number: [id number] Date of Birth/Sex: Treating RN: 06-17-1966 (55 y.o. Benjamin [name] Primary Care Provider: [name] Other Clinician: Referring Provider: Treating Provider/Extender: [name], [name] in Treatment: 1 Diagnosis Coding ICD-10 Codes Code Description W59.11XA Bitten by nonvenomous snake, initial encounter L98.492 Non-pressure chronic ulcer of skin of other sites with fat layer exposed I48.0 Paroxysmal atrial fibrillation Z79.01 Long term (current) use of anticoagulants I50.42 Chronic combined systolic (congestive) and diastolic (congestive) heart failure Z99.81 Dependence on supplemental oxygen I10 Essential (primary) hypertension Facility Procedures CPT4 Code Description Modifier Quantity [id number] [phone] - WOUND CARE VISIT-LEV 3 EST PT 1 Electronic Signature(s) Signed: 11/18/2020 2:59:34 PM By: Zandra Abts RN, BSN Signed: 11/19/2020 8:24:08 AM By: Lenda Kelp PA-C Entered By: Zandra Abts on 11/18/2020 10:27:01

## 2020-11-21 DIAGNOSIS — G4733 Obstructive sleep apnea (adult) (pediatric): Secondary | ICD-10-CM | POA: Diagnosis not present

## 2020-11-21 DIAGNOSIS — J9611 Chronic respiratory failure with hypoxia: Secondary | ICD-10-CM | POA: Diagnosis not present

## 2020-11-21 DIAGNOSIS — I5032 Chronic diastolic (congestive) heart failure: Secondary | ICD-10-CM | POA: Diagnosis not present

## 2020-11-23 ENCOUNTER — Telehealth (HOSPITAL_COMMUNITY): Payer: Self-pay | Admitting: Licensed Clinical Social Worker

## 2020-11-23 NOTE — Telephone Encounter (Signed)
CSW called pt to remind of appts this week and next week and ensure he had transportation.  Pt confirms he was aware of foot MD and CPAP titration appts this week and has transportation set up for both.  CSW also reminded of wound care appt next week- CSW texted pt the details and he will set up transports to get to that appt.  Will continue to follow and assist as needed  [name], LCSW Clinical Social Worker [facility] Desk#: [phone] Cell#: [phone]

## 2020-11-25 ENCOUNTER — Telehealth (HOSPITAL_COMMUNITY): Payer: Self-pay | Admitting: Licensed Clinical Social Worker

## 2020-11-25 ENCOUNTER — Other Ambulatory Visit: Payer: Self-pay

## 2020-11-25 ENCOUNTER — Ambulatory Visit (HOSPITAL_BASED_OUTPATIENT_CLINIC_OR_DEPARTMENT_OTHER): Payer: Medicaid Other | Attending: Pulmonary Disease | Admitting: Pulmonary Disease

## 2020-11-25 ENCOUNTER — Ambulatory Visit: Payer: Medicaid Other | Admitting: Podiatry

## 2020-11-25 DIAGNOSIS — Z9989 Dependence on other enabling machines and devices: Secondary | ICD-10-CM | POA: Insufficient documentation

## 2020-11-25 DIAGNOSIS — M2041 Other hammer toe(s) (acquired), right foot: Secondary | ICD-10-CM

## 2020-11-25 DIAGNOSIS — G4733 Obstructive sleep apnea (adult) (pediatric): Secondary | ICD-10-CM | POA: Insufficient documentation

## 2020-11-25 DIAGNOSIS — M2042 Other hammer toe(s) (acquired), left foot: Secondary | ICD-10-CM | POA: Diagnosis not present

## 2020-11-25 NOTE — Telephone Encounter (Signed)
Pt called pt to request help getting new diabetic shoes- got script from physician visit today.  CSW helped pt to call [facility] supply to inquire about getting shoes- they sent new referral form.  Form will need to be completed or signed off on by MD so CSW sent message to pt PCP office who had assisted with this in the past- awaiting return message  Will continue to follow and assist as needed  [name], LCSW Clinical Social Worker [facility] Desk#: [phone] Cell#: [phone]

## 2020-11-26 ENCOUNTER — Telehealth (HOSPITAL_COMMUNITY): Payer: Self-pay | Admitting: Licensed Clinical Social Worker

## 2020-11-26 ENCOUNTER — Telehealth: Payer: Self-pay

## 2020-11-26 ENCOUNTER — Encounter: Payer: Self-pay | Admitting: Podiatry

## 2020-11-26 NOTE — Telephone Encounter (Signed)
CSW received call back from [employer] worker who was able to take pt application for [employer] over the phone  Application completed- they will email pt a verification that he has been added to the list.  Will continue to follow and assist as needed  [name], LCSW Clinical Social Worker [facility] Desk#: [phone] Cell#: [phone]

## 2020-11-26 NOTE — Telephone Encounter (Signed)
CSW sent updated diabetic shoe referral form to RN Case Manger, Erskine Squibb, at [facility]- she will discuss with provider and assist with completing as able  CSW will continue to follow and assist as needed  [name], LCSW Clinical Social Worker [facility] Desk#: [phone] Cell#: [phone]

## 2020-11-26 NOTE — Progress Notes (Signed)
Subjective:  Patient ID: Benjamin [name], male    DOB: 03-03-1966,  MRN: [id number]  Chief Complaint  Patient presents with  . Nail Problem    Nail trim     55 y.o. male presents with the above complaint.  Patient presents with follow-up on left foot midfoot arthritis.  She does not have any pain.  The injection helped considerably.  He would like to know if he could get diabetic shoes.  He wants a prescription for it.  He denies any other acute complaints.   Review of Systems: Negative except as noted in the HPI. Denies N/V/F/Ch.  Past Medical History:  Diagnosis Date  . A-fib ([place])   . Anemia 05/29/2019  . CHF (congestive heart failure) ([place])   . Chronic respiratory failure ([place])   . Diabetes mellitus without complication ([place])   . Dyspnea   . Elevated troponin 02/06/2019  . Hypertension   . Obesity   . Pulmonary embolism ([place])   . Rectal bleeding 05/29/2019  . Sleep apnea     Current Outpatient Medications:  .  amoxicillin-clavulanate (AUGMENTIN) 875-125 MG tablet, Take 1 tablet by mouth 2 (two) times daily. (Patient not taking: No sig reported), Disp: 14 tablet, Rfl: 0 .  atorvastatin (LIPITOR) 40 MG tablet, Take 1 tablet (40 mg total) by mouth at bedtime., Disp: 90 tablet, Rfl: 2 .  Blood Glucose Monitoring Suppl (TRUE METRIX METER) w/Device KIT, Use as instructed. Check blood glucose level by fingerstick twice per day., Disp: 1 kit, Rfl: 0 .  Blood Pressure Monitor DEVI, Please provide patient with insurance approved blood pressure device with L-XL cuff. BMI 55 (Patient taking differently: 1 each by Other route See admin instructions. Please provide patient with insurance approved blood pressure device with L-XL cuff. BMI 55), Disp: 1 each, Rfl: 0 .  carboxymethylcellulose (REFRESH PLUS) 0.5 % SOLN, Place 1 drop into both eyes daily., Disp: , Rfl:  .  diltiazem (CARDIZEM CD) 240 MG 24 hr capsule, Take 1 capsule (240 mg total) by mouth daily., Disp: 30 capsule, Rfl: 3 .   doxazosin (CARDURA) 2 MG tablet, Take 1 tablet (2 mg total) by mouth daily., Disp: 30 tablet, Rfl: 11 .  ferrous sulfate 325 (65 FE) MG tablet, Take 1 tablet (325 mg total) by mouth daily., Disp: 90 tablet, Rfl: 2 .  glucose blood (TRUE METRIX BLOOD GLUCOSE TEST) test strip, Use as instructed. Check blood glucose level by fingerstick twice per day., Disp: 100 each, Rfl: 12 .  hydrALAZINE (APRESOLINE) 50 MG tablet, Take 1 tablet (50 mg total) by mouth 3 (three) times daily., Disp: 90 tablet, Rfl: 3 .  labetalol (NORMODYNE) 200 MG tablet, Take 1 tablet (200 mg total) by mouth 2 (two) times daily., Disp: 60 tablet, Rfl: 0 .  losartan (COZAAR) 100 MG tablet, Take 1 tablet (100 mg total) by mouth daily. (Patient not taking: No sig reported), Disp: 30 tablet, Rfl: 11 .  Lumateperone Tosylate (CAPLYTA) 42 MG CAPS, Take 42 mg by mouth at bedtime. (Patient not taking: No sig reported), Disp: 30 capsule, Rfl:  .  magnesium oxide (MAG-OX) 400 (241.3 Mg) MG tablet, TAKE 2 TABLETS BY MOUTH EVERY MORNING AND 1 TABLET EVERY EVENING NOON+PM+BEDTIME), Disp: 90 tablet, Rfl: 3 .  metFORMIN (GLUCOPHAGE) 500 MG tablet, TAKE ONE TABLET BY MOUTH TWICE DAILY WITH A MEAL (NOON+BEDTIME), Disp: 180 tablet, Rfl: 0 .  Misc. Devices MISC, Requires O2 @ 3L/min continuously via nasal canula and home fill system,. (Patient taking differently:  1 each by Other route See admin instructions. Requires O2 @ 3L/min continuously via nasal canula and home fill system,.), Disp: 1 each, Rfl: 0 .  Misc. Devices MISC, Please provide Mr. Kniss with insurance approved portable O2 concentrator ICD 10 J96.11 [phone] (Patient taking differently: 1 each by Other route See admin instructions. Please provide Mr. Markoff with insurance approved portable O2 concentrator ICD 10 J96.11 Z99.81), Disp: 1 each, Rfl: 0 .  Misc. Devices MISC, Please provide patient with insurance approved LARGE right hand splint ICD 10 G56.01, Disp: 1 each, Rfl: 0 .  omeprazole  (PRILOSEC) 20 MG capsule, TAKE 1 CAPSULE BY MOUTH 2 (TWO) TIMES DAILY BEFORE A MEAL. TAKE FIRST CAPSULE 30 MIN PRIOR TO EATING OR TAKING OTHER MEDICATIONS. (AM+BEDTIME), Disp: 180 capsule, Rfl: 2 .  potassium chloride (KLOR-CON) 10 MEQ tablet, Take 8 tablets (80 mEq total) by mouth daily., Disp: 240 tablet, Rfl: 3 .  PROVENTIL HFA 108 (90 Base) MCG/ACT inhaler, INHALE 2 PUFFS BY MOUTH EVERY 6 (SIX) HOURS AS NEEDED FOR WHEEZING OR SHORTNESS OF BREATH., Disp: 18 g, Rfl: 6 .  spironolactone (ALDACTONE) 50 MG tablet, TAKE 1 TABLET (50 MG TOTAL) BY MOUTH DAILY (NOON), Disp: 30 tablet, Rfl: 3 .  torsemide (DEMADEX) 20 MG tablet, Take 55m by mouth twice daily, Disp: 180 tablet, Rfl: 3 .  TRUEplus Lancets 28G MISC, Use as instructed. Check blood glucose level by fingerstick twice per day., Disp: 200 each, Rfl: 3 .  XARELTO 20 MG TABS tablet, TAKE 1 TABLET BY MOUTH DAILY WITH SUPPER (BEDTIME), Disp: 60 tablet, Rfl: 6  Social History   Tobacco Use  Smoking Status Former Smoker  Smokeless Tobacco Never Used  Tobacco Comment   smoked weed in the past    No Known Allergies Objective:  There were no vitals filed for this visit. There is no height or weight on file to calculate BMI. Constitutional Well developed. Well nourished.  Vascular Dorsalis pedis pulses palpable bilaterally. Posterior tibial pulses palpable bilaterally. Capillary refill normal to all digits.  No cyanosis or clubbing noted. Pedal hair growth normal.  Neurologic Normal speech. Oriented to person, place, and time. Epicritic sensation to light touch grossly present bilaterally.  Dermatologic Nails well groomed and normal in appearance. No open wounds. No skin lesions.  Orthopedic:  No pain on palpation of left dorsal midfoot.  No pain with range of motion of the digits 2 through 5.  No pain with resisted dorsiflexion plantarflexion of the digits.  No pain with range of motion of the tarsometatarsal joints  Semiflexible  hammertoe contractures noted bilaterally 2 through 5.  They appear to be semiflexible in nature.   Radiographs: None Assessment:   1. Hammertoe, bilateral    Plan:  Patient was evaluated and treated and all questions answered.  Left midfoot arthritis -Clinically healed.  Hammertoe bilateral -I explained to patient the etiology of hammertoe contractures various treatment options were discussed.  Given that patient is a diabetic and a high risk of ulceration are associated with it given the contractures of the hammertoes.  Patient will benefit from diabetic shoes.   -He was given a prescription to obtain diabetic shoes.  No follow-ups on file.

## 2020-11-26 NOTE — Telephone Encounter (Signed)
Call received from [facility] noting that patient needs new diabetic shoes.  She has sent the required paperwork for provider to complete.

## 2020-11-29 NOTE — Telephone Encounter (Signed)
Will complete in 7-10 business days once received

## 2020-11-30 ENCOUNTER — Encounter (HOSPITAL_BASED_OUTPATIENT_CLINIC_OR_DEPARTMENT_OTHER): Payer: Medicaid Other | Admitting: Internal Medicine

## 2020-12-01 ENCOUNTER — Other Ambulatory Visit (HOSPITAL_COMMUNITY): Payer: Self-pay

## 2020-12-01 ENCOUNTER — Telehealth: Payer: Self-pay | Admitting: Pulmonary Disease

## 2020-12-01 DIAGNOSIS — Z9989 Dependence on other enabling machines and devices: Secondary | ICD-10-CM

## 2020-12-01 DIAGNOSIS — G4733 Obstructive sleep apnea (adult) (pediatric): Secondary | ICD-10-CM

## 2020-12-01 NOTE — Procedures (Signed)
    Patient Name: Benjamin, [name] Date: 11/25/2020 Gender: Male D.O.B: [birth year] Age (years): 10 Referring Provider: Coralyn Helling MD, ABSM Height (inches): 71 Interpreting Physician: Coralyn Helling MD, ABSM Weight (lbs): 353 RPSGT: Armen Pickup BMI: 49 MRN: [id number] Neck Size: 21.50  CLINICAL INFORMATION The patient is referred for a BiPAP titration to treat sleep apnea.  Date of NPSG: 05/17/19, AHI 128.1, SpO2 low 51%.  SLEEP STUDY TECHNIQUE As per the AASM Manual for the Scoring of Sleep and Associated Events v2.3 (April 2016) with a hypopnea requiring 4% desaturations.  The channels recorded and monitored were frontal, central and occipital EEG, electrooculogram (EOG), submentalis EMG (chin), nasal and oral airflow, thoracic and abdominal wall motion, anterior tibialis EMG, snore microphone, electrocardiogram, and pulse oximetry. Bilevel positive airway pressure (BPAP) was initiated at the beginning of the study and titrated to treat sleep-disordered breathing.  MEDICATIONS Medications self-administered by patient taken the night of the study : N/A  RESPIRATORY PARAMETERS Optimal IPAP Pressure (cm): 28 AHI at Optimal Pressure (/hr) 4.2 Optimal EPAP Pressure (cm): 24   Overall Minimal O2 (%): 65.0 Minimal O2 at Optimal Pressure (%): 85.0 SLEEP ARCHITECTURE Start Time: 9:30:09 PM Stop Time: 4:12:52 AM Total Time (min): 402.7 Total Sleep Time (min): 367.4 Sleep Latency (min): 0.0 Sleep Efficiency (%): 91.2% REM Latency (min): 8.4 WASO (min): 35.3 Stage N1 (%): 0.5% Stage N2 (%): 50.6% Stage N3 (%): 0.1% Stage R (%): 48.7 Supine (%): 100.00 Arousal Index (/hr): 11.4   CARDIAC DATA The 2 lead EKG demonstrated sinus rhythm. The mean heart rate was 74.5 beats per minute. Other EKG findings include: None.  LEG MOVEMENT DATA The total Periodic Limb Movements of Sleep (PLMS) were 0. The PLMS index was 0.0. A PLMS index of <15 is considered normal in  adults.  IMPRESSIONS - CPAP did not control his sleep apnea. - Did best with Bipap 28/24 cm H2O. - Required use of 2 liters supplemental oxygen with Bipap.  DIAGNOSIS - Obstructive Sleep Apnea (G47.33)  RECOMMENDATIONS - Trial of BiPAP therapy on 28/24 cm H2O with 2 liters supplemental oxygen. - He was fitted with a Medium Wide size Philips Respironics Full Face Mask Dreamwear mask and heated humidification. - Avoid alcohol, sedatives and other CNS depressants that may worsen sleep apnea and disrupt normal sleep architecture. - Sleep hygiene should be reviewed to assess factors that may improve sleep quality. - Weight management and regular exercise should be initiated or continued.  [Electronically signed] 12/01/2020 01:45 PM  Coralyn Helling MD, ABSM Diplomate, American Board of Sleep Medicine   NPI: [id number]

## 2020-12-01 NOTE — Progress Notes (Signed)
Paramedicine Encounter    Patient ID: Benjamin [name], male    DOB: 02-08-1966, 55 y.o.   MRN: [id number]   Patient Care Team: [name], NP as PCP - General (Nurse Practitioner) [name], MD as PCP - Cardiology (Cardiology) [name], LCSW as Social Worker (Licensed Clinical Social Worker)  Patient Active Problem List   Diagnosis Date Noted  . Polysubstance abuse ([place]) 08/20/2020  . Alcohol abuse 08/20/2020  . Atrial fibrillation with rapid ventricular response ([place]) 08/19/2020  . Obesity hypoventilation syndrome ([place]) 06/12/2020  . Healthcare maintenance 06/12/2020  . Acute on chronic diastolic heart failure ([place]) 02/24/2020  . Diabetic neuropathy ([place]) 11/20/2019  . Coagulation disorder ([place]) 11/20/2019  . Acute on chronic diastolic CHF (congestive heart failure), NYHA class 3 ([place]) 07/30/2019  . Diabetes mellitus without complication ([place]) 62/95/2841  . Hypertensive emergency   . BRBPR (bright red blood per rectum) 06/12/2019  . Hypertension   . CHF (congestive heart failure) ([place])   . Sleep apnea   . Chronic respiratory failure ([place])   . Hypokalemia 05/29/2019  . GI bleed 05/29/2019  . Anemia 05/28/2019  . Acute on chronic diastolic CHF (congestive heart failure) ([place]) 04/11/2019  . Iron deficiency anemia 03/11/2019  . OSA on CPAP 03/11/2019  . HLD (hyperlipidemia) 03/11/2019  . Elevated troponin 03/11/2019  . GERD (gastroesophageal reflux disease) 03/11/2019  . Rectal bleeding 02/20/2019  . Dyspnea 02/06/2019  . COVID-19 virus infection 02/06/2019  . Bilateral lower extremity edema   . Morbid obesity with BMI of 50.0-59.9, adult ([place])   . Chronic diastolic CHF (congestive heart failure) ([place])   . PAF (paroxysmal atrial fibrillation) ([place])   . PE (pulmonary thromboembolism) ([place]) 01/21/2019  . Hypertensive urgency 01/21/2019  . Diabetes mellitus type 2 in obese ([place]) 01/21/2019    Current Outpatient Medications:  .  amoxicillin-clavulanate  (AUGMENTIN) 875-125 MG tablet, Take 1 tablet by mouth 2 (two) times daily. (Patient not taking: No sig reported), Disp: 14 tablet, Rfl: 0 .  atorvastatin (LIPITOR) 40 MG tablet, Take 1 tablet (40 mg total) by mouth at bedtime., Disp: 90 tablet, Rfl: 2 .  Blood Glucose Monitoring Suppl (TRUE METRIX METER) w/Device KIT, Use as instructed. Check blood glucose level by fingerstick twice per day., Disp: 1 kit, Rfl: 0 .  Blood Pressure Monitor DEVI, Please provide patient with insurance approved blood pressure device with L-XL cuff. BMI 55 (Patient taking differently: 1 each by Other route See admin instructions. Please provide patient with insurance approved blood pressure device with L-XL cuff. BMI 55), Disp: 1 each, Rfl: 0 .  carboxymethylcellulose (REFRESH PLUS) 0.5 % SOLN, Place 1 drop into both eyes daily., Disp: , Rfl:  .  diltiazem (CARDIZEM CD) 240 MG 24 hr capsule, Take 1 capsule (240 mg total) by mouth daily., Disp: 30 capsule, Rfl: 3 .  doxazosin (CARDURA) 2 MG tablet, Take 1 tablet (2 mg total) by mouth daily., Disp: 30 tablet, Rfl: 11 .  ferrous sulfate 325 (65 FE) MG tablet, Take 1 tablet (325 mg total) by mouth daily., Disp: 90 tablet, Rfl: 2 .  glucose blood (TRUE METRIX BLOOD GLUCOSE TEST) test strip, Use as instructed. Check blood glucose level by fingerstick twice per day., Disp: 100 each, Rfl: 12 .  hydrALAZINE (APRESOLINE) 50 MG tablet, Take 1 tablet (50 mg total) by mouth 3 (three) times daily., Disp: 90 tablet, Rfl: 3 .  labetalol (NORMODYNE) 200 MG tablet, Take 1 tablet (200 mg total) by mouth 2 (  two) times daily., Disp: 60 tablet, Rfl: 0 .  losartan (COZAAR) 100 MG tablet, Take 1 tablet (100 mg total) by mouth daily. (Patient not taking: No sig reported), Disp: 30 tablet, Rfl: 11 .  Lumateperone Tosylate (CAPLYTA) 42 MG CAPS, Take 42 mg by mouth at bedtime. (Patient not taking: No sig reported), Disp: 30 capsule, Rfl:  .  magnesium oxide (MAG-OX) 400 (241.3 Mg) MG tablet, TAKE 2  TABLETS BY MOUTH EVERY MORNING AND 1 TABLET EVERY EVENING NOON+PM+BEDTIME), Disp: 90 tablet, Rfl: 3 .  metFORMIN (GLUCOPHAGE) 500 MG tablet, TAKE ONE TABLET BY MOUTH TWICE DAILY WITH A MEAL (NOON+BEDTIME), Disp: 180 tablet, Rfl: 0 .  Misc. Devices MISC, Requires O2 @ 3L/min continuously via nasal canula and home fill system,. (Patient taking differently: 1 each by Other route See admin instructions. Requires O2 @ 3L/min continuously via nasal canula and home fill system,.), Disp: 1 each, Rfl: 0 .  Misc. Devices MISC, Please provide Mr. Timson with insurance approved portable O2 concentrator ICD 10 J96.11 [phone] (Patient taking differently: 1 each by Other route See admin instructions. Please provide Mr. Holtsclaw with insurance approved portable O2 concentrator ICD 10 J96.11 Z99.81), Disp: 1 each, Rfl: 0 .  Misc. Devices MISC, Please provide patient with insurance approved LARGE right hand splint ICD 10 G56.01, Disp: 1 each, Rfl: 0 .  omeprazole (PRILOSEC) 20 MG capsule, TAKE 1 CAPSULE BY MOUTH 2 (TWO) TIMES DAILY BEFORE A MEAL. TAKE FIRST CAPSULE 30 MIN PRIOR TO EATING OR TAKING OTHER MEDICATIONS. (AM+BEDTIME), Disp: 180 capsule, Rfl: 2 .  potassium chloride (KLOR-CON) 10 MEQ tablet, Take 8 tablets (80 mEq total) by mouth daily., Disp: 240 tablet, Rfl: 3 .  PROVENTIL HFA 108 (90 Base) MCG/ACT inhaler, INHALE 2 PUFFS BY MOUTH EVERY 6 (SIX) HOURS AS NEEDED FOR WHEEZING OR SHORTNESS OF BREATH., Disp: 18 g, Rfl: 6 .  spironolactone (ALDACTONE) 50 MG tablet, TAKE 1 TABLET (50 MG TOTAL) BY MOUTH DAILY (NOON), Disp: 30 tablet, Rfl: 3 .  torsemide (DEMADEX) 20 MG tablet, Take 97m by mouth twice daily, Disp: 180 tablet, Rfl: 3 .  TRUEplus Lancets 28G MISC, Use as instructed. Check blood glucose level by fingerstick twice per day., Disp: 200 each, Rfl: 3 .  XARELTO 20 MG TABS tablet, TAKE 1 TABLET BY MOUTH DAILY WITH SUPPER (BEDTIME), Disp: 60 tablet, Rfl: 6 No Known Allergies    Social History    Socioeconomic History  . Marital status: Single    Spouse name: Not on file  . Number of children: Not on file  . Years of education: Not on file  . Highest education level: Not on file  Occupational History  . Not on file  Tobacco Use  . Smoking status: Former SResearch scientist (life sciences) . Smokeless tobacco: Never Used  . Tobacco comment: smoked weed in the past  Vaping Use  . Vaping Use: Never used  Substance and Sexual Activity  . Alcohol use: Not Currently  . Drug use: Not Currently    Types: Marijuana  . Sexual activity: Not Currently  Other Topics Concern  . Not on file  Social History Narrative  . Not on file   Social Determinants of Health   Financial Resource Strain: Not on file  Food Insecurity: Not on file  Transportation Needs: Not on file  Physical Activity: Not on file  Stress: Not on file  Social Connections: Not on file  Intimate Partner Violence: Not on file    Physical Exam  Future Appointments  Date Time Provider [place]  12/02/2020 10:45 AM Jeri Cos Ivey III, PA-C [facility]  12/21/2020  9:50 AM [name], NP [facility] None  01/21/2021 11:30 AM [facility] PA/NP [facility] None    BP (!) 156/0 Comment: palpated  Pulse 96   Resp 20   SpO2 96%   Weight yesterday-350 Last visit weight-353  Pt repots that he is doing good. He has about 2 wks left of his meds. He denies any sob, no dizziness, he reports taking meds as directed.  He is making it to his appointments. Weighing daily. Weights are stable.  B/p elevated, but will be getting his new bi-pap soon-he is going to be switching from cpap to bipap.  meds checked.    [name], [facility] Paramedic  12/01/20

## 2020-12-01 NOTE — Telephone Encounter (Signed)
Bipap titration 11/25/20 >> Bipap 28/24 cm H2O with 2 liters O2 >> AHI 4.2.   Please let him know that he needs to be switched from CPAP to Bipap machine based on titration study.  Please send order for Bipap 28/24 cm H2O with 2 liters oxygen.  Please set up ROV in 3 months after getting Bipap set up.

## 2020-12-02 ENCOUNTER — Encounter (HOSPITAL_BASED_OUTPATIENT_CLINIC_OR_DEPARTMENT_OTHER): Payer: Medicaid Other | Admitting: Physician Assistant

## 2020-12-02 ENCOUNTER — Other Ambulatory Visit: Payer: Self-pay

## 2020-12-02 DIAGNOSIS — I480 Paroxysmal atrial fibrillation: Secondary | ICD-10-CM | POA: Diagnosis not present

## 2020-12-02 DIAGNOSIS — E1151 Type 2 diabetes mellitus with diabetic peripheral angiopathy without gangrene: Secondary | ICD-10-CM | POA: Diagnosis not present

## 2020-12-02 DIAGNOSIS — I5042 Chronic combined systolic (congestive) and diastolic (congestive) heart failure: Secondary | ICD-10-CM | POA: Diagnosis not present

## 2020-12-02 DIAGNOSIS — E11622 Type 2 diabetes mellitus with other skin ulcer: Secondary | ICD-10-CM | POA: Diagnosis not present

## 2020-12-02 DIAGNOSIS — I110 Hypertensive heart disease with heart failure: Secondary | ICD-10-CM | POA: Diagnosis not present

## 2020-12-02 DIAGNOSIS — Z9981 Dependence on supplemental oxygen: Secondary | ICD-10-CM | POA: Diagnosis not present

## 2020-12-02 DIAGNOSIS — Z7901 Long term (current) use of anticoagulants: Secondary | ICD-10-CM | POA: Diagnosis not present

## 2020-12-02 DIAGNOSIS — L98492 Non-pressure chronic ulcer of skin of other sites with fat layer exposed: Secondary | ICD-10-CM | POA: Diagnosis not present

## 2020-12-02 NOTE — Telephone Encounter (Signed)
Called and went over Bipap titration study results per Dr Craige Cotta with patient. All questions answered and patient agreeable for Bipap with 2 Liters oxygen to be ordered.  Orders placed per Dr Craige Cotta. Patient expressed full understanding to call to schedule office visit with Dr Craige Cotta for 3 months after getting Bipap set up. Nothing further needed at this time.

## 2020-12-02 NOTE — Progress Notes (Addendum)
[name], [name] ([id number]) Visit Report for 12/02/2020 Chief Complaint Document Details Patient Name: Date of Service: Benjamin [name] 12/02/2020 10:45 A M Medical Record Number: [id number] Patient Account Number: [id number] Date of Birth/Sex: Treating RN: September 03, 1966 (55 y.o. Benjamin [name] Primary Care Provider: [name] Other Clinician: Referring Provider: Treating Provider/Extender: [name], [name] Weeks in Treatment: 3 Information Obtained from: Patient Chief Complaint Right abdominal ulcer/snake bite Electronic Signature(s) Signed: 12/02/2020 10:30:29 AM By: Lenda Kelp PA-C Entered By: Lenda Kelp on 12/02/2020 10:30:29 -------------------------------------------------------------------------------- HPI Details Patient Name: Date of Service: Benjamin Blalock MS, Benjamin Gain R. 12/02/2020 10:45 A M Medical Record Number: [id number] Patient Account Number: [id number] Date of Birth/Sex: Treating RN: 1966-01-17 (55 y.o. Benjamin [name] Primary Care Provider: [name] Other Clinician: Referring Provider: Treating Provider/Extender: [name] Weeks in Treatment: 3 History of Present Illness HPI Description: 11/11/2020 upon evaluation today patient appears to be doing decently well after sustaining a snakebite when he was fishing about a month ago. Subsequently he tells me it was a black snake that bit him so apparently nonvenomous and that some of the individuals he was fishing with actually ate the snake. With that being said he overall tells me that he is concerned only with the fact that he wants this to heal it is doing a little bit better than it was but nonetheless he wonders if there is anything we can do to help things along here. Fortunately there is no signs of active infection at this time. No fevers, chills, nausea, vomiting, or diarrhea. He does have a history of paroxysmal atrial fibrillation. Long-term use of  anticoagulants, congestive heart failure, he does use oxygen at home at times he tells me, and he has hypertension. Fortunately there does not appear to be evidence of infection at the moment. 12/02/2020 on evaluation today patient actually appears to be doing excellent in regard to his wound on the abdomen. This is completely healed and shows no signs of infection and he is having no discomfort. There is obviously scar tissue here but that is all that is remaining at this point. Electronic Signature(s) Signed: 12/02/2020 11:23:32 AM By: Lenda Kelp PA-C Entered By: Lenda Kelp on 12/02/2020 11:23:32 -------------------------------------------------------------------------------- Physical Exam Details Patient Name: Date of Service: Benjamin [name]. 12/02/2020 10:45 A M Medical Record Number: [id number] Patient Account Number: [id number] Date of Birth/Sex: Treating RN: Jul 29, 1966 (55 y.o. Benjamin [name] Primary Care Provider: [name] Other Clinician: Referring Provider: Treating Provider/Extender: [name], [name] in Treatment: 3 Constitutional Obese and well-hydrated in no acute distress. Respiratory normal breathing without difficulty. Psychiatric this patient is able to make decisions and demonstrates good insight into disease process. Alert and Oriented x 3. pleasant and cooperative. Notes Patient's wound bed actually showed signs of good granulation epithelization at this time. In fact there is no remaining granulation tissue but everything has completely closed over and in fact he is doing quite well. I think he is ready for discharge. Electronic Signature(s) Signed: 12/02/2020 11:23:49 AM By: Lenda Kelp PA-C Entered By: Lenda Kelp on 12/02/2020 11:23:49 -------------------------------------------------------------------------------- Physician Orders Details Patient Name: Date of Service: Benjamin Blalock MS, Benjamin Gain R. 12/02/2020 10:45 A  M Medical Record Number: [id number] Patient Account Number: [id number] Date of Birth/Sex: Treating RN: 1965-10-02 (55 y.o. 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 12/02/2020 10:45 A M Medical Record Number: [id number] Patient Account Number: [id number] Date of Birth/Sex: Treating RN: 09/17/1966 (55 y.o. Benjamin [name] Primary Care Provider: [name] Other Clinician: Referring Provider: Treating Provider/Extender: [name] Weeks in Treatment: 3 Active Problems ICD-10 Encounter Code Description Active Date MDM Diagnosis W59.11XA Bitten by nonvenomous snake, initial encounter 11/11/2020 No Yes L98.492 Non-pressure chronic ulcer of skin of other sites with fat layer exposed 11/11/2020 No Yes I48.0 Paroxysmal atrial fibrillation 11/11/2020 No Yes Z79.01 Long term (current) use of anticoagulants 11/11/2020 No Yes I50.42 Chronic combined systolic (congestive) and  diastolic (congestive) heart failure 11/11/2020 No Yes Z99.81 Dependence on supplemental oxygen 11/11/2020 No Yes I10 Essential (primary) hypertension 11/11/2020 No Yes Inactive Problems Resolved Problems Electronic Signature(s) Signed: 12/02/2020 10:30:23 AM By: Lenda Kelp PA-C Entered By: Lenda Kelp on 12/02/2020 10:30:23 -------------------------------------------------------------------------------- Progress Note Details Patient Name: Date of Service: Benjamin Blalock MS, Benjamin Gain R. 12/02/2020 10:45 A M Medical Record Number: [id number] Patient Account Number: [id number] Date of Birth/Sex: Treating RN: [birth year] (55 y.o. Benjamin [name] Primary Care Provider: [name] Other Clinician: Referring Provider: Treating Provider/Extender: [name] Weeks in Treatment: 3 Subjective Chief Complaint Information obtained from Patient Right abdominal ulcer/snake bite History of Present Illness (HPI) 11/11/2020 upon evaluation today patient appears to be doing decently well after sustaining a snakebite when he was fishing about a month ago. Subsequently he tells me it was a black snake that bit him so apparently nonvenomous and that some of the individuals he was fishing with actually ate the snake. With that being said he overall tells me that he is concerned only with the fact that he wants this to heal it is doing a little bit better than it was but nonetheless he wonders if there is anything we can do to help things along here. Fortunately there is no signs of active infection at this time. No fevers, chills, nausea, vomiting, or diarrhea. He does have a history of paroxysmal atrial fibrillation. Long-term use of anticoagulants, congestive heart failure, he does use oxygen at home at times he tells me, and he has hypertension. Fortunately there does not appear to be evidence of infection at the moment. 12/02/2020 on evaluation today patient actually appears to be  doing excellent in regard to his wound on the abdomen. This is completely healed and shows no signs of infection and he is having no discomfort. There is obviously scar tissue here but that is all that is remaining at this point. Objective Constitutional Obese and well-hydrated in no acute distress. Vitals Time Taken: 10:47 AM, Height: 71 in, Weight: 350 lbs, BMI: 48.8, Temperature: 97.9 F, Pulse: 84 bpm, Respiratory Rate: 20 breaths/min, Blood Pressure: 178/98 mmHg, Capillary Blood Glucose: 97 mg/dl. General Notes: glucose per pt report Respiratory normal breathing without difficulty.  Psychiatric this patient is able to make decisions and demonstrates good insight into disease process. Alert and Oriented x 3. pleasant and cooperative. General Notes: Patient's wound bed actually showed signs of good granulation epithelization at this time. In fact there is no remaining granulation tissue but everything has completely closed over and in fact he is doing quite well. I think he is ready for discharge. Integumentary (Hair, Skin) Wound #1 status is Healed - Epithelialized. Original cause of wound was Bite. The date acquired was: 10/28/2020. The wound has been in treatment 3 weeks. The wound is located on the Right Abdomen - Lower Quadrant. The wound measures 0cm length x 0cm width x 0cm depth; 0cm^2 area and 0cm^3 volume. There is no tunneling or undermining noted. There is a none present amount of drainage noted. The wound margin is distinct with the outline attached to the wound base. There is no granulation within the wound bed. There is no necrotic tissue within the wound bed. Assessment Active Problems ICD-10 Bitten by nonvenomous snake, initial encounter Non-pressure chronic ulcer of skin of other sites with fat layer exposed Paroxysmal atrial fibrillation Long term (current) use of anticoagulants Chronic combined systolic (congestive) and diastolic (congestive) heart failure Dependence  on supplemental oxygen Essential (primary) hypertension Plan [facility]charge [facility]: Discharge from Wound Care Center 1. Would recommend that we discontinue wound care services as the patient is completely healed. 2. I did recommend some lotion over the area such as Eucerin to help keep it moist and allow the scar tissue to toughen up over time. He is in agreement with that plan. We will see the patient back for follow-up visit as needed. Electronic Signature(s) Signed: 12/02/2020 11:24:17 AM By: Lenda Kelp PA-C Entered By: Lenda Kelp on 12/02/2020 11:24:16 -------------------------------------------------------------------------------- SuperBill Details Patient Name: Date of Service: Benjamin Blalock MS, Benjamin Gain R. 12/02/2020 Medical Record Number: [id number] Patient Account Number: [id number] Date of Birth/Sex: Treating RN: 07-Apr-1966 (55 y.o. Benjamin [name] Primary Care Provider: [name] Other Clinician: Referring Provider: Treating Provider/Extender: [name], [name] in Treatment: 3 Diagnosis Coding ICD-10 Codes Code Description W59.11XA Bitten by nonvenomous snake, initial encounter L98.492 Non-pressure chronic ulcer of skin of other sites with fat layer exposed I48.0 Paroxysmal atrial fibrillation Z79.01 Long term (current) use of anticoagulants I50.42 Chronic combined systolic (congestive) and diastolic (congestive) heart failure Z99.81 Dependence on supplemental oxygen I10 Essential (primary) hypertension Facility Procedures CPT4 Code: 56812751 Description: 70017 - WOUND CARE VISIT-LEV 2 EST PT Modifier: Quantity: 1 Physician Procedures : CPT4 Code Description Modifier [id number] - WC PHYS LEVEL 3 - EST PT ICD-10 Diagnosis Description W59.11XA Bitten by nonvenomous snake, initial encounter L98.492 Non-pressure chronic ulcer of skin of other sites with fat layer exposed I48.0  Paroxysmal atrial fibrillation Z79.01 Long term  (current) use of anticoagulants Quantity: 1 Electronic Signature(s) Signed: 12/02/2020 11:24:34 AM By: Lenda Kelp PA-C Entered By: Lenda Kelp on 12/02/2020 11:24:34

## 2020-12-03 NOTE — Progress Notes (Signed)
SHUN, PLETZ ([id number]) Visit Report for 12/02/2020 Arrival Information Details Patient Name: Date of Service: Benjamin [name] 12/02/2020 10:45 A M Medical Record Number: [id number] Patient Account Number: [id number] Date of Birth/Sex: Treating RN: 09-22-1965 (55 y.o. [name] Primary Care Kiwan Gadsden: [name] Other Clinician: Referring Kerah Hardebeck: Treating Jasemine Nawaz/Extender: [name] Weeks in Treatment: 3 Visit Information History Since Last Visit Added or deleted any medications: No Patient Arrived: Ambulatory Any new allergies or adverse reactions: No Arrival Time: 10:44 Had a fall or experienced change in No Accompanied By: alone activities of daily living that may affect Transfer Assistance: None risk of falls: Patient Identification Verified: Yes Signs or symptoms of abuse/neglect since last visito No Secondary Verification Process Completed: Yes Hospitalized since last visit: No Patient Requires Transmission-Based Precautions: No Implantable device outside of the clinic excluding No Patient Has Alerts: Yes cellular tissue based products placed in the center Patient Alerts: Patient on Blood Thinner since last visit: Has Dressing in Place as Prescribed: Yes Pain Present Now: No Electronic Signature(s) Signed: 12/03/2020 5:54:24 PM By: Zandra Abts RN, BSN Entered By: Zandra Abts on 12/02/2020 10:45:04 -------------------------------------------------------------------------------- Clinic Level of Care Assessment Details Patient Name: Date of Service: [facility] MS, [name] 12/02/2020 10:45 A M Medical Record Number: [id number] Patient Account Number: [id number] Date of Birth/Sex: Treating RN: 13-Feb-1966 (55 y.o. [name] Primary Care Kenyonna Micek: [name] Other Clinician: Referring Roneshia Drew: Treating Tyesha Joffe/Extender: [name] Weeks in Treatment: 3 Clinic Level of Care Assessment  Items TOOL 4 Quantity Score []  - 0 Use when only an EandM is performed on FOLLOW-UP visit ASSESSMENTS - Nursing Assessment / Reassessment X- 1 10 Reassessment of Co-morbidities (includes updates in patient status) X- 1 5 Reassessment of Adherence to Treatment Plan ASSESSMENTS - Wound and Skin A ssessment / Reassessment X - Simple Wound Assessment / Reassessment - one wound 1 5 []  - 0 Complex Wound Assessment / Reassessment - multiple wounds []  - 0 Dermatologic / Skin Assessment (not related to wound area) ASSESSMENTS - Focused Assessment []  - 0 Circumferential Edema Measurements - multi extremities []  - 0 Nutritional Assessment / Counseling / Intervention []  - 0 Lower Extremity Assessment (monofilament, tuning fork, pulses) []  - 0 Peripheral Arterial Disease Assessment (using hand held doppler) ASSESSMENTS - Ostomy and/or Continence Assessment and Care []  - 0 Incontinence Assessment and Management []  - 0 Ostomy Care Assessment and Management (repouching, etc.) PROCESS - Coordination of Care X - Simple Patient / Family Education for ongoing care 1 15 []  - 0 Complex (extensive) Patient / Family Education for ongoing care X- 1 10 Staff obtains , Records, T Results / Process Orders est []  - 0 Staff telephones HHA, Nursing Homes / Clarify orders / etc []  - 0 Routine Transfer to another Facility (non-emergent condition) []  - 0 Routine Hospital Admission (non-emergent condition) []  - 0 New Admissions / / Ordering NPWT Apligraf, etc. , []  - 0 Emergency Hospital Admission (emergent condition) X- 1 10 Simple Discharge Coordination []  - 0 Complex (extensive) Discharge Coordination PROCESS - Special Needs []  - 0 Pediatric / Minor Patient Management []  - 0 Isolation Patient Management []  - 0 Hearing / Language / Visual special needs []  - 0 Assessment of Community assistance (transportation, D/C planning, etc.) []  - 0 Additional  assistance / Altered mentation []  - 0 Support Surface(s) Assessment (bed, cushion, seat, etc.) INTERVENTIONS - Wound Cleansing / Measurement X - Simple Wound Cleansing - one  wound 1 5 []  - 0 Complex Wound Cleansing - multiple wounds X- 1 5 Wound Imaging (photographs - any number of wounds) []  - 0 Wound Tracing (instead of photographs) []  - 0 Simple Wound Measurement - one wound []  - 0 Complex Wound Measurement - multiple wounds INTERVENTIONS - Wound Dressings []  - 0 Small Wound Dressing one or multiple wounds []  - 0 Medium Wound Dressing one or multiple wounds []  - 0 Large Wound Dressing one or multiple wounds []  - 0 Application of Medications - topical []  - 0 Application of Medications - injection INTERVENTIONS - Miscellaneous []  - 0 External ear exam []  - 0 Specimen Collection (cultures, biopsies, blood, body fluids, etc.) []  - 0 Specimen(s) / Culture(s) sent or taken to Lab for analysis []  - 0 Patient Transfer (multiple staff / / Similar devices) []  - 0 Simple Staple / Suture removal (25 or less) []  - 0 Complex Staple / Suture removal (26 or more) []  - 0 Hypo / Hyperglycemic Management (close monitor of Blood Glucose) []  - 0 Ankle / Brachial Index (ABI) - do not check if billed separately X- 1 5 Vital Signs Has the patient been seen at the hospital within the last three years: Yes Total Score: 70 Level Of Care: New/Established - Level 2 Electronic Signature(s) Signed: 12/03/2020 5:44:19 PM By: RN, BSN Entered By: on 12/02/2020 11:18:46 -------------------------------------------------------------------------------- Encounter Discharge Information Details Patient Name: Date of Service: MS, R. 12/02/2020 10:45 A M Medical Record Number: Patient Account Number: Date of Birth/Sex: Treating RN: Sep 10, 1966 (55 y.o. Primary Care Rajon Bisig: Nurse, adult Other  Clinician: Referring Dywane Peruski: Treating Aimie Wagman/Extender: Weeks in Treatment: 3 Encounter Discharge Information Items Discharge Condition: Stable Ambulatory Status: Ambulatory Discharge Destination: Home Transportation: Other Accompanied By: self Schedule Follow-up Appointment: Yes Clinical Summary of Care: Patient Declined Notes transportation service Electronic Signature(s) Signed: 12/03/2020 5:44:19 PM By: RN, BSN Entered By: on 12/02/2020 11:20:49 -------------------------------------------------------------------------------- Multi-Disciplinary Care Plan Details Patient Name: Date of Service: [name] MS, [name] R. 12/02/2020 10:45 A M Medical Record Number: Orlinda Blalock Patient Account Number: [name] Date of Birth/Sex: Treating RN: 1966-07-06 (55 y.o. [id number] Primary Care Talana Slatten: 11/10/1965 Other Clinician: Referring Mica Ramdass: Treating Marnisha Stampley/Extender: 53 in Treatment: 3 Multidisciplinary Care Plan reviewed with physician Active Inactive Electronic Signature(s) Signed: 12/03/2020 5:44:19 PM By: [name] RN, BSN Entered By: [name] on 12/02/2020 11:18:01 -------------------------------------------------------------------------------- Pain Assessment Details Patient Name: Date of Service: [name] R. 12/02/2020 10:45 A M Medical Record Number: 12/04/2020 Patient Account Number: Orlinda Blalock Date of Birth/Sex: Treating RN: Jan 10, 1966 (55 y.o. [id number] Primary Care Leeah Politano: [id number] Other Clinician: Referring Kiyah Demartini: Treating Alizon Schmeling/Extender: 11/10/1965, 53 Weeks in Treatment: 3 Active Problems Location of Pain Severity and Description of Pain Patient Has Paino No Site Locations Pain Management and Medication Current Pain Management: Electronic Signature(s) Signed: 12/03/2020 5:54:24 PM By:  [name] RN, BSN Entered By: Merdis Delay on 12/02/2020 10:47:31 -------------------------------------------------------------------------------- Patient/Caregiver Education Details Patient Name: Date of Service: [name] 3/16/2022andnbsp10:45 A M Medical Record Number: 12/04/2020 Patient Account Number: Andrey Farmer Date of Birth/Gender: Treating RN: [birth year] (55 y.o. [id number] Primary Care Physician: 11/10/1965 Other Clinician: Referring Physician: Treating Physician/Extender: 53 in Treatment: 3 Education Assessment Education Provided To: Patient Education Topics Provided Wound/Skin Impairment: Methods:  Explain/Verbal Responses: Reinforcements needed, State content correctly Electronic Signature(s) Signed: 12/03/2020 5:44:19 PM By: [name] RN, BSN Entered By: [name] on 12/02/2020 11:18:15 -------------------------------------------------------------------------------- Wound Assessment Details Patient Name: Date of Service: [name] 12/02/2020 10:45 A M Medical Record Number: [id number] Patient Account Number: [id number] Date of Birth/Sex: Treating RN: [birth year] (55 y.o. [name] Primary Care Baptiste Littler: [name] Other Clinician: Referring Lynesha Bango: Treating Xaviera Flaten/Extender: [name], [name] in Treatment: 3 Wound Status Wound Number: 1 Primary Inflammatory Etiology: Wound Location: Right Abdomen - Lower Quadrant Wound Healed - Epithelialized Wounding Event: Bite Status: Date Acquired: 10/28/2020 Comorbid Anemia, Sleep Apnea, Arrhythmia, Congestive Heart Failure, Weeks Of Treatment: 3 History: Hypertension, Type II Diabetes Clustered Wound: No Wound Measurements Length: (cm) Width: (cm) Depth: (cm) Area: (cm) Volume: (cm) 0 % Reduction in Area: 100% 0 % Reduction in Volume: 100% 0 Epithelialization: Large (67-100%) 0  Tunneling: No 0 Undermining: No Wound Description Classification: Full Thickness Without Exposed Support Structures Wound Margin: Distinct, outline attached Exudate Amount: None Present Foul Odor After Cleansing: No Slough/Fibrino No Wound Bed Granulation Amount: None Present (0%) Exposed Structure Necrotic Amount: None Present (0%) Fascia Exposed: No Fat Layer (Subcutaneous Tissue) Exposed: No Tendon Exposed: No Muscle Exposed: No Joint Exposed: No Bone Exposed: No Electronic Signature(s) Signed: 12/03/2020 5:54:24 PM By: Zandra Abts RN, BSN Entered By: Zandra Abts on 12/02/2020 10:47:55 -------------------------------------------------------------------------------- Vitals Details Patient Name: Date of Service: [name], [name] 12/02/2020 10:45 A M Medical Record Number: [id number] Patient Account Number: [id number] Date of Birth/Sex: Treating RN: [birth year] (55 y.o. [name] Primary Care Jacobus Colvin: [name] Other Clinician: Referring Manuel Dall: Treating Kristyanna Barcelo/Extender: [name], [name] in Treatment: 3 Vital Signs Time Taken: 10:47 Temperature (F): 97.9 Height (in): 71 Pulse (bpm): 84 Weight (lbs): 350 Respiratory Rate (breaths/min): 20 Body Mass Index (BMI): 48.8 Blood Pressure (mmHg): 178/98 Capillary Blood Glucose (mg/dl): 97 Reference Range: 80 - 120 mg / dl Notes glucose per pt report Electronic Signature(s) Signed: 12/03/2020 5:54:24 PM By: Zandra Abts RN, BSN Entered By: Zandra Abts on 12/02/2020 10:47:25

## 2020-12-10 ENCOUNTER — Telehealth (HOSPITAL_COMMUNITY): Payer: Self-pay | Admitting: Licensed Clinical Social Worker

## 2020-12-10 NOTE — Telephone Encounter (Signed)
CSW received call from pt wondering about status of BIPAP order.  CSW called [facility] who confirms they have referral and are working on processing and making sure they have all necessary documentation to order equipment.  Unable to give estimate of how long this process will take or length of time to order equipment at this time.  Will continue to follow and assist as needed  [name], LCSW Clinical Social Worker [facility] Desk#: [phone] Cell#: [phone]

## 2020-12-14 ENCOUNTER — Other Ambulatory Visit: Payer: Self-pay | Admitting: Nurse Practitioner

## 2020-12-14 ENCOUNTER — Other Ambulatory Visit (HOSPITAL_COMMUNITY): Payer: Self-pay | Admitting: Cardiology

## 2020-12-14 DIAGNOSIS — E1165 Type 2 diabetes mellitus with hyperglycemia: Secondary | ICD-10-CM

## 2020-12-14 DIAGNOSIS — IMO0002 Reserved for concepts with insufficient information to code with codable children: Secondary | ICD-10-CM

## 2020-12-14 NOTE — Telephone Encounter (Signed)
Requested Prescriptions  Pending Prescriptions Disp Refills  . metFORMIN (GLUCOPHAGE) 500 MG tablet [Pharmacy Med Name: METFORMIN HCL 500 MG ORAL TABLET] 180 tablet 0    Sig: TAKE ONE TABLET BY MOUTH TWICE DAILY WITH A MEAL (NOON+BEDTIME)     Endocrinology:  Diabetes - Biguanides Failed - 12/14/2020  5:26 PM      Failed - Cr in normal range and within 360 days    Creatinine, Ser  Date Value Ref Range Status  10/26/2020 1.29 (H) 0.61 - 1.24 mg/dL Final   Creatinine, Urine  Date Value Ref Range Status  02/08/2019 226.67 mg/dL Final    Comment:    Performed at [facility], [place] [address] [place], [place] [address]         Passed - HBA1C is between 0 and 7.9 and within 180 days    Hgb A1c MFr Bld  Date Value Ref Range Status  08/19/2020 6.1 (H) 4.8 - 5.6 % Final    Comment:    (NOTE) Pre diabetes:          5.7%-6.4%  Diabetes:              >6.4%  Glycemic control for   <7.0% adults with diabetes          Passed - AA eGFR in normal range and within 360 days    GFR calc Af Amer  Date Value Ref Range Status  03/02/2020 >60 >60 mL/min Final   GFR, Estimated  Date Value Ref Range Status  10/26/2020 >60 >60 mL/min Final    Comment:    (NOTE) Calculated using the CKD-EPI Creatinine Equation (2021)          Passed - Valid encounter within last 6 months    Recent Outpatient Visits          1 month ago Wound of abdomen   Aguada, [name] W, NP   2 months ago Hospital discharge follow-up   Wahoo, [name] W, NP   8 months ago Essential hypertension   Burchard, [place] W, NP   11 months ago Diabetes mellitus type 2, uncontrolled, with complications [facility])   [place] [name], NP   11 months ago Generalized abdominal pain   Grayson, Vernia Buff, NP       Future Appointments            In 1 week [name], NP [place]

## 2020-12-21 ENCOUNTER — Encounter: Payer: Self-pay | Admitting: Nurse Practitioner

## 2020-12-21 ENCOUNTER — Other Ambulatory Visit: Payer: Self-pay

## 2020-12-21 ENCOUNTER — Ambulatory Visit: Payer: Medicaid Other | Attending: Nurse Practitioner | Admitting: Nurse Practitioner

## 2020-12-21 VITALS — BP 187/103 | HR 73 | Resp 20 | Ht 71.0 in | Wt 362.0 lb

## 2020-12-21 DIAGNOSIS — I10 Essential (primary) hypertension: Secondary | ICD-10-CM | POA: Diagnosis not present

## 2020-12-21 DIAGNOSIS — D649 Anemia, unspecified: Secondary | ICD-10-CM | POA: Diagnosis not present

## 2020-12-21 DIAGNOSIS — IMO0002 Reserved for concepts with insufficient information to code with codable children: Secondary | ICD-10-CM

## 2020-12-21 DIAGNOSIS — E118 Type 2 diabetes mellitus with unspecified complications: Secondary | ICD-10-CM

## 2020-12-21 DIAGNOSIS — E1165 Type 2 diabetes mellitus with hyperglycemia: Secondary | ICD-10-CM | POA: Diagnosis not present

## 2020-12-21 LAB — GLUCOSE, POCT (MANUAL RESULT ENTRY): POC Glucose: 146 mg/dl — AB (ref 70–99)

## 2020-12-21 NOTE — Progress Notes (Signed)
Assessment & Plan:  Benjamin [name] was seen today for diabetes.  Diagnoses and all orders for this visit:  Essential hypertension Continue all antihypertensives as prescribed.  Remember to bring in your blood pressure log with you for your follow up appointment.  DASH/Mediterranean Diets are healthier choices for HTN.   Diabetes mellitus type 2, uncontrolled, with complications (HCC) -     POCT glucose (manual entry) -     Hemoglobin A1c Continue blood sugar control as discussed in office today, low carbohydrate diet, and regular physical exercise as tolerated, 150 minutes per week (30 min each day, 5 days per week, or 50 min 3 days per week). Keep blood sugar logs with fasting goal of 90-130 mg/dl, post prandial (after you eat) less than 180.  For Hypoglycemia: BS <60 and Hyperglycemia BS >400; contact the clinic ASAP. Annual eye exams and foot exams are recommended.   Anemia, unspecified type -     CBC    Patient has been counseled on age-appropriate routine health concerns for screening and prevention. These are reviewed and up-to-date. Referrals have been placed accordingly. Immunizations are up-to-date or declined.    Subjective:   Chief Complaint  Patient presents with  . Diabetes   HPI Benjamin [name] 55 y.o. male presents to office today for follow up. PMHof diastolic CHF, HTN, DM, Atrial fib, PE (on Xarelto), OSA,(compliant with CPAP)GERD, chronic elevation of troponin. He is being followed closely by the heart failure program andParamedicine program.  He is falling asleep in the exam room today. States still waiting on his BIPAP. I instructed him to follow up with Pulmonology as soon as possible regarding this.   Essential Hypertension Poorly controlled. I requested he bring his pill pack to next office visit for pill check. Current medications include cardizem 240 mg daily, cardura 2 mg daily, hydralazine 50 mg TID, losartan 100 mg daily, spironolactone 50 mg  daily and torsemide 20 mg daily as prescribed. Denies chest pain today.  BP Readings from Last 3 Encounters:  12/21/20 (!) 187/103  12/01/20 (!) 156/0  11/17/20 (!) 184/0   DM2 Well controlled with metformin 500 mg BID. LDL at goal with atorvastatin 40 mg daily. He denies any symptoms of hypo or hyperglycemia. Still waiting for his diabetic shoes. I believe we sent this form off months ago but will need to check with the case manager regarding this.  Lab Results  Component Value Date   HGBA1C 6.1 (H) 08/19/2020   Lab Results  Component Value Date   LDLCALC 55 08/23/2020   Review of Systems  Constitutional: Negative for fever, malaise/fatigue and weight loss.  HENT: Negative.  Negative for nosebleeds.   Eyes: Negative.  Negative for blurred vision, double vision and photophobia.  Respiratory: Negative.  Negative for cough, shortness of breath and wheezing.   Cardiovascular: Negative.  Negative for chest pain, palpitations and leg swelling.  Gastrointestinal: Negative.  Negative for heartburn, nausea and vomiting.  Musculoskeletal: Negative.  Negative for myalgias.  Neurological: Negative.  Negative for dizziness, focal weakness, seizures and headaches.  Psychiatric/Behavioral: Negative.  Negative for suicidal ideas.    Past Medical History:  Diagnosis Date  . A-fib ([place])   . Anemia 05/29/2019  . CHF (congestive heart failure) ([place])   . Chronic respiratory failure ([place])   . Diabetes mellitus without complication ([employer])   . Dyspnea   . Elevated troponin 02/06/2019  . Hypertension   . Obesity   . Pulmonary embolism ([place])   . Rectal  bleeding 05/29/2019  . Sleep apnea     Past Surgical History:  Procedure Laterality Date  . CARDIOVERSION N/A 08/21/2020   Procedure: CARDIOVERSION;  Surgeon: Larey Dresser, MD;  Location: [facility] ENDOSCOPY;  Service: Cardiovascular;  Laterality: N/A;  . COLONOSCOPY WITH PROPOFOL Left 02/25/2019   Procedure: COLONOSCOPY WITH PROPOFOL;  Surgeon:  Arta Silence, MD;  Location: [place];  Service: Endoscopy;  Laterality: Left;  . ESOPHAGOGASTRODUODENOSCOPY (EGD) WITH PROPOFOL Left 02/25/2019   Procedure: ESOPHAGOGASTRODUODENOSCOPY (EGD) WITH PROPOFOL;  Surgeon: Arta Silence, MD;  Location[facility] ENDOSCOPY;  Service: Endoscopy;  Laterality: Left;  . ESOPHAGOGASTRODUODENOSCOPY (EGD) WITH PROPOFOL N/A 06/12/2019   Procedure: ESOPHAGOGASTRODUODENOSCOPY (EGD) WITH PROPOFOL;  Surgeon: Clarene Essex, MD;  Location: Home;  Service: Gastroenterology;  Laterality: N/A;  . FLEXIBLE SIGMOIDOSCOPY N/A 06/12/2019   Procedure: FLEXIBLE SIGMOIDOSCOPY;  Surgeon: Clarene Essex, MD;  Location: [place];  Service: Gastroenterology;  Laterality: N/A;  . HEMORRHOID BANDING  05/2019  . NO PAST SURGERIES    . RIGHT HEART CATH N/A 04/17/2019   Procedure: RIGHT HEART CATH;  Surgeon: Larey Dresser, MD;  Location: [place] CV LAB;  Service: Cardiovascular;  Laterality: N/A;  . TEE WITHOUT CARDIOVERSION N/A 08/21/2020   Procedure: TRANSESOPHAGEAL ECHOCARDIOGRAM (TEE);  Surgeon: Larey Dresser, MD;  Location: [facility] ENDOSCOPY;  Service: Cardiovascular;  Laterality: N/A;    Family History  Problem Relation Age of Onset  . Hypertension Mother   . Diabetes Mother     Social History Reviewed with no changes to be made today.   Outpatient Medications Prior to Visit  Medication Sig Dispense Refill  . atorvastatin (LIPITOR) 40 MG tablet Take 1 tablet (40 mg total) by mouth at bedtime. 90 tablet 2  . Blood Glucose Monitoring Suppl (TRUE METRIX METER) w/Device KIT Use as instructed. Check blood glucose level by fingerstick twice per day. 1 kit 0  . Blood Pressure Monitor DEVI Please provide patient with insurance approved blood pressure device with L-XL cuff. BMI 55 (Patient taking differently: 1 each by Other route See admin instructions. Please provide patient with insurance approved blood pressure device with L-XL cuff. BMI 55) 1 each 0  .  carboxymethylcellulose (REFRESH PLUS) 0.5 % SOLN Place 1 drop into both eyes daily.    Marland Kitchen diltiazem (CARDIZEM CD) 240 MG 24 hr capsule Take 1 capsule (240 mg total) by mouth daily. 30 capsule 3  . doxazosin (CARDURA) 2 MG tablet Take 1 tablet (2 mg total) by mouth daily. 30 tablet 11  . glucose blood (TRUE METRIX BLOOD GLUCOSE TEST) test strip Use as instructed. Check blood glucose level by fingerstick twice per day. 100 each 12  . hydrALAZINE (APRESOLINE) 50 MG tablet Take 1 tablet (50 mg total) by mouth 3 (three) times daily. 90 tablet 3  . labetalol (NORMODYNE) 200 MG tablet TAKE ONE TABLET BY MOUTH TWICE DAILY ( NOON +BEDTIME) 60 tablet 2  . losartan (COZAAR) 100 MG tablet Take 1 tablet (100 mg total) by mouth daily. 30 tablet 11  . Lumateperone Tosylate (CAPLYTA) 42 MG CAPS Take 42 mg by mouth at bedtime. 30 capsule   . magnesium oxide (MAG-OX) 400 (241.3 Mg) MG tablet TAKE 2 TABLETS BY MOUTH EVERY MORNING AND 1 TABLET EVERY EVENING NOON+PM+BEDTIME) 90 tablet 3  . metFORMIN (GLUCOPHAGE) 500 MG tablet TAKE ONE TABLET BY MOUTH TWICE DAILY WITH A MEAL (NOON+BEDTIME) 180 tablet 0  . Misc. Devices MISC Requires O2 @ 3L/min continuously via nasal canula and home fill system,. (Patient taking  differently: 1 each by Other route See admin instructions. Requires O2 @ 3L/min continuously via nasal canula and home fill system,.) 1 each 0  . Misc. Devices MISC Please provide Mr. Mattioli with insurance approved portable O2 concentrator ICD 10 J96.11 [phone] (Patient taking differently: 1 each by Other route See admin instructions. Please provide Mr. Eimers with insurance approved portable O2 concentrator ICD 10 J96.11 Z99.81) 1 each 0  . Misc. Devices MISC Please provide patient with insurance approved LARGE right hand splint ICD 10 G56.01 1 each 0  . omeprazole (PRILOSEC) 20 MG capsule TAKE 1 CAPSULE BY MOUTH 2 (TWO) TIMES DAILY BEFORE A MEAL. TAKE FIRST CAPSULE 30 MIN PRIOR TO EATING OR TAKING OTHER  MEDICATIONS. (AM+BEDTIME) 180 capsule 2  . potassium chloride (KLOR-CON) 10 MEQ tablet Take 8 tablets (80 mEq total) by mouth daily. 240 tablet 3  . PROVENTIL HFA 108 (90 Base) MCG/ACT inhaler INHALE 2 PUFFS BY MOUTH EVERY 6 (SIX) HOURS AS NEEDED FOR WHEEZING OR SHORTNESS OF BREATH. 18 g 6  . spironolactone (ALDACTONE) 50 MG tablet TAKE 1 TABLET (50 MG TOTAL) BY MOUTH DAILY (NOON) 30 tablet 3  . torsemide (DEMADEX) 20 MG tablet Take 6m by mouth twice daily 180 tablet 3  . TRUEplus Lancets 28G MISC Use as instructed. Check blood glucose level by fingerstick twice per day. 200 each 3  . XARELTO 20 MG TABS tablet TAKE 1 TABLET BY MOUTH DAILY WITH SUPPER (BEDTIME) 60 tablet 6  . amoxicillin-clavulanate (AUGMENTIN) 875-125 MG tablet Take 1 tablet by mouth 2 (two) times daily. 14 tablet 0  . ferrous sulfate 325 (65 FE) MG tablet Take 1 tablet (325 mg total) by mouth daily. 90 tablet 2   No facility-administered medications prior to visit.    No Known Allergies     Objective:    BP (!) 187/103   Pulse 73   Resp 20   Ht 5' 11"  (1.803 m)   Wt (!) 362 lb (164.2 kg)   SpO2 93%   BMI 50.49 kg/m  Wt Readings from Last 3 Encounters:  12/21/20 (!) 362 lb (164.2 kg)  11/25/20 (!) 353 lb (160.1 kg)  11/04/20 (!) 354 lb (160.6 kg)    Physical Exam Vitals and nursing note reviewed.  Constitutional:      Appearance: He is well-developed.  HENT:     Head: Normocephalic and atraumatic.  Cardiovascular:     Rate and Rhythm: Normal rate and regular rhythm.     Heart sounds: Normal heart sounds. No murmur heard. No friction rub. No gallop.   Pulmonary:     Effort: Pulmonary effort is normal. No tachypnea or respiratory distress.     Breath sounds: Normal breath sounds. No decreased breath sounds, wheezing, rhonchi or rales.     Comments: Chronic O2 Chest:     Chest wall: No tenderness.  Abdominal:     General: Bowel sounds are normal.     Palpations: Abdomen is soft.  Musculoskeletal:         General: Normal range of motion.     Cervical back: Normal range of motion.  Skin:    General: Skin is warm and dry.  Neurological:     Mental Status: He is alert and oriented to person, place, and time.     Coordination: Coordination normal.  Psychiatric:        Behavior: Behavior normal. Behavior is cooperative.        Thought Content: Thought content normal.  Judgment: Judgment normal.          Patient has been counseled extensively about nutrition and exercise as well as the importance of adherence with medications and regular follow-up. The patient was given clear instructions to go to ER or return to medical center if symptoms don't improve, worsen or new problems develop. The patient verbalized understanding.   Follow-up: Return in about 3 months (around 03/22/2021).   [name], FNP-BC [facility] and [facility] [place], [place]   12/21/2020, 4:35 PM

## 2020-12-21 NOTE — Progress Notes (Deleted)
Established Patient Office Visit  Subjective:  Patient ID: Benjamin [name], male    DOB: [birth year]  Age: 55 y.o. MRN: [id number]  CC:  Chief Complaint  Patient presents with  . Diabetes    HPI Benjamin [name] presents for ***  Past Medical History:  Diagnosis Date  . A-fib ([place])   . Anemia 05/29/2019  . CHF (congestive heart failure) ([place])   . Chronic respiratory failure ([place])   . Diabetes mellitus without complication ([place])   . Dyspnea   . Elevated troponin 02/06/2019  . Hypertension   . Obesity   . Pulmonary embolism ([place])   . Rectal bleeding 05/29/2019  . Sleep apnea     Past Surgical History:  Procedure Laterality Date  . CARDIOVERSION N/A 08/21/2020   Procedure: CARDIOVERSION;  Surgeon: Larey Dresser, MD;  Location: [facility] ENDOSCOPY;  Service: Cardiovascular;  Laterality: N/A;  . COLONOSCOPY WITH PROPOFOL Left 02/25/2019   Procedure: COLONOSCOPY WITH PROPOFOL;  Surgeon: Arta Silence, MD;  Location: [place];  Service: Endoscopy;  Laterality: Left;  . ESOPHAGOGASTRODUODENOSCOPY (EGD) WITH PROPOFOL Left 02/25/2019   Procedure: ESOPHAGOGASTRODUODENOSCOPY (EGD) WITH PROPOFOL;  Surgeon: Arta Silence, MD;  Location: [facility] ENDOSCOPY;  Service: Endoscopy;  Laterality: Left;  . ESOPHAGOGASTRODUODENOSCOPY (EGD) WITH PROPOFOL N/A 06/12/2019   Procedure: ESOPHAGOGASTRODUODENOSCOPY (EGD) WITH PROPOFOL;  Surgeon: Clarene Essex, MD;  Location: [place];  Service: Gastroenterology;  Laterality: N/A;  . FLEXIBLE SIGMOIDOSCOPY N/A 06/12/2019   Procedure: FLEXIBLE SIGMOIDOSCOPY;  Surgeon: Clarene Essex, MD;  Location: [place];  Service: Gastroenterology;  Laterality: N/A;  . HEMORRHOID BANDING  05/2019  . NO PAST SURGERIES    . RIGHT HEART CATH N/A 04/17/2019   Procedure: RIGHT HEART CATH;  Surgeon: Larey Dresser, MD;  Location: [place] CV LAB;  Service: Cardiovascular;  Laterality: N/A;  . TEE WITHOUT CARDIOVERSION N/A 08/21/2020   Procedure: TRANSESOPHAGEAL  ECHOCARDIOGRAM (TEE);  Surgeon: Larey Dresser, MD;  Location: [facility] ENDOSCOPY;  Service: Cardiovascular;  Laterality: N/A;    Family History  Problem Relation Age of Onset  . Hypertension Mother   . Diabetes Mother     Social History   Socioeconomic History  . Marital status: Single    Spouse name: Not on file  . Number of children: Not on file  . Years of education: Not on file  . Highest education level: Not on file  Occupational History  . Not on file  Tobacco Use  . Smoking status: Former Research scientist (life sciences)  . Smokeless tobacco: Never Used  . Tobacco comment: smoked weed in the past  Vaping Use  . Vaping Use: Never used  Substance and Sexual Activity  . Alcohol use: Not Currently  . Drug use: Not Currently    Types: Marijuana  . Sexual activity: Not Currently  Other Topics Concern  . Not on file  Social History Narrative  . Not on file   Social Determinants of Health   Financial Resource Strain: Not on file  Food Insecurity: Not on file  Transportation Needs: Not on file  Physical Activity: Not on file  Stress: Not on file  Social Connections: Not on file  Intimate Partner Violence: Not on file    Outpatient Medications Prior to Visit  Medication Sig Dispense Refill  . amoxicillin-clavulanate (AUGMENTIN) 875-125 MG tablet Take 1 tablet by mouth 2 (two) times daily. (Patient not taking: No sig reported) 14 tablet 0  . atorvastatin (LIPITOR) 40 MG tablet Take 1 tablet (40 mg total) by mouth at  bedtime. 90 tablet 2  . Blood Glucose Monitoring Suppl (TRUE METRIX METER) w/Device KIT Use as instructed. Check blood glucose level by fingerstick twice per day. 1 kit 0  . Blood Pressure Monitor DEVI Please provide patient with insurance approved blood pressure device with L-XL cuff. BMI 55 (Patient taking differently: 1 each by Other route See admin instructions. Please provide patient with insurance approved blood pressure device with L-XL cuff. BMI 55) 1 each 0  .  carboxymethylcellulose (REFRESH PLUS) 0.5 % SOLN Place 1 drop into both eyes daily.    Marland Kitchen diltiazem (CARDIZEM CD) 240 MG 24 hr capsule Take 1 capsule (240 mg total) by mouth daily. 30 capsule 3  . doxazosin (CARDURA) 2 MG tablet Take 1 tablet (2 mg total) by mouth daily. 30 tablet 11  . ferrous sulfate 325 (65 FE) MG tablet Take 1 tablet (325 mg total) by mouth daily. 90 tablet 2  . glucose blood (TRUE METRIX BLOOD GLUCOSE TEST) test strip Use as instructed. Check blood glucose level by fingerstick twice per day. 100 each 12  . hydrALAZINE (APRESOLINE) 50 MG tablet Take 1 tablet (50 mg total) by mouth 3 (three) times daily. 90 tablet 3  . labetalol (NORMODYNE) 200 MG tablet TAKE ONE TABLET BY MOUTH TWICE DAILY ( NOON +BEDTIME) 60 tablet 2  . losartan (COZAAR) 100 MG tablet Take 1 tablet (100 mg total) by mouth daily. (Patient not taking: No sig reported) 30 tablet 11  . Lumateperone Tosylate (CAPLYTA) 42 MG CAPS Take 42 mg by mouth at bedtime. (Patient not taking: No sig reported) 30 capsule   . magnesium oxide (MAG-OX) 400 (241.3 Mg) MG tablet TAKE 2 TABLETS BY MOUTH EVERY MORNING AND 1 TABLET EVERY EVENING NOON+PM+BEDTIME) 90 tablet 3  . metFORMIN (GLUCOPHAGE) 500 MG tablet TAKE ONE TABLET BY MOUTH TWICE DAILY WITH A MEAL (NOON+BEDTIME) 180 tablet 0  . Misc. Devices MISC Requires O2 @ 3L/min continuously via nasal canula and home fill system,. (Patient taking differently: 1 each by Other route See admin instructions. Requires O2 @ 3L/min continuously via nasal canula and home fill system,.) 1 each 0  . Misc. Devices MISC Please provide Mr. Kopka with insurance approved portable O2 concentrator ICD 10 J96.11 [phone] (Patient taking differently: 1 each by Other route See admin instructions. Please provide Mr. Caterino with insurance approved portable O2 concentrator ICD 10 J96.11 Z99.81) 1 each 0  . Misc. Devices MISC Please provide patient with insurance approved LARGE right hand splint ICD 10 G56.01  1 each 0  . omeprazole (PRILOSEC) 20 MG capsule TAKE 1 CAPSULE BY MOUTH 2 (TWO) TIMES DAILY BEFORE A MEAL. TAKE FIRST CAPSULE 30 MIN PRIOR TO EATING OR TAKING OTHER MEDICATIONS. (AM+BEDTIME) 180 capsule 2  . potassium chloride (KLOR-CON) 10 MEQ tablet Take 8 tablets (80 mEq total) by mouth daily. 240 tablet 3  . PROVENTIL HFA 108 (90 Base) MCG/ACT inhaler INHALE 2 PUFFS BY MOUTH EVERY 6 (SIX) HOURS AS NEEDED FOR WHEEZING OR SHORTNESS OF BREATH. 18 g 6  . spironolactone (ALDACTONE) 50 MG tablet TAKE 1 TABLET (50 MG TOTAL) BY MOUTH DAILY (NOON) 30 tablet 3  . torsemide (DEMADEX) 20 MG tablet Take 58m by mouth twice daily 180 tablet 3  . TRUEplus Lancets 28G MISC Use as instructed. Check blood glucose level by fingerstick twice per day. 200 each 3  . XARELTO 20 MG TABS tablet TAKE 1 TABLET BY MOUTH DAILY WITH SUPPER (BEDTIME) 60 tablet 6   No facility-administered medications prior  to visit.    No Known Allergies  ROS Review of Systems    Objective:    Physical Exam  There were no vitals taken for this visit. Wt Readings from Last 3 Encounters:  11/25/20 (!) 353 lb (160.1 kg)  11/04/20 (!) 354 lb (160.6 kg)  11/02/20 (!) 354 lb (160.6 kg)     Health Maintenance Due  Topic Date Due  . Hepatitis C Screening  Never done  . COVID-19 Vaccine (3 - Booster for Pfizer series) 07/31/2020  . FOOT EXAM  11/19/2020    There are no preventive care reminders to display for this patient.  Lab Results  Component Value Date   TSH 2.293 07/30/2019   Lab Results  Component Value Date   WBC 9.6 10/26/2020   HGB 12.6 (L) 10/26/2020   HCT 39.9 10/26/2020   MCV 79.2 (L) 10/26/2020   PLT 451 (H) 10/26/2020   Lab Results  Component Value Date   NA 138 10/26/2020   K 3.8 10/26/2020   CO2 30 10/26/2020   GLUCOSE 149 (H) 10/26/2020   BUN 13 10/26/2020   CREATININE 1.29 (H) 10/26/2020   BILITOT 0.4 10/26/2020   ALKPHOS 51 10/26/2020   AST 18 10/26/2020   ALT 19 10/26/2020   PROT 7.5  10/26/2020   ALBUMIN 3.1 (L) 10/26/2020   CALCIUM 9.0 10/26/2020   ANIONGAP 9 10/26/2020   Lab Results  Component Value Date   CHOL 107 08/23/2020   Lab Results  Component Value Date   HDL 37 (L) 08/23/2020   Lab Results  Component Value Date   LDLCALC 55 08/23/2020   Lab Results  Component Value Date   TRIG 77 08/23/2020   Lab Results  Component Value Date   CHOLHDL 2.9 08/23/2020   Lab Results  Component Value Date   HGBA1C 6.1 (H) 08/19/2020      Assessment & Plan:   Problem List Items Addressed This Visit   None   Visit Diagnoses    Diabetes mellitus type 2, uncontrolled, with complications ([place])    -  Primary   Relevant Orders   POCT glucose (manual entry)      No orders of the defined types were placed in this encounter.   Follow-up: No follow-ups on file.    Sherlean Foot, RN

## 2020-12-22 DIAGNOSIS — I5032 Chronic diastolic (congestive) heart failure: Secondary | ICD-10-CM | POA: Diagnosis not present

## 2020-12-22 DIAGNOSIS — J9611 Chronic respiratory failure with hypoxia: Secondary | ICD-10-CM | POA: Diagnosis not present

## 2020-12-22 LAB — CBC
Hematocrit: 39.9 % (ref 37.5–51.0)
Hemoglobin: 12.8 g/dL — ABNORMAL LOW (ref 13.0–17.7)
MCH: 25.2 pg — ABNORMAL LOW (ref 26.6–33.0)
MCHC: 32.1 g/dL (ref 31.5–35.7)
MCV: 79 fL (ref 79–97)
Platelets: 383 10*3/uL (ref 150–450)
RBC: 5.08 x10E6/uL (ref 4.14–5.80)
RDW: 14.7 % (ref 11.6–15.4)
WBC: 6.8 10*3/uL (ref 3.4–10.8)

## 2020-12-22 LAB — HEMOGLOBIN A1C
Est. average glucose Bld gHb Est-mCnc: 128 mg/dL
Hgb A1c MFr Bld: 6.1 % — ABNORMAL HIGH (ref 4.8–5.6)

## 2020-12-23 ENCOUNTER — Other Ambulatory Visit: Payer: Self-pay | Admitting: Nurse Practitioner

## 2020-12-23 ENCOUNTER — Telehealth: Payer: Self-pay

## 2020-12-23 MED ORDER — FERROUS SULFATE 325 (65 FE) MG PO TABS: 325.0000 mg | ORAL_TABLET | Freq: Every day | ORAL | 2 refills | Status: DC

## 2020-12-23 NOTE — Telephone Encounter (Signed)
-----   Message from [name] W Fleming, NP sent at 12/23/2020  8:23 AM EDT ----- Labs show mild anemia.  Make sure you are taking your iron every day as prescribed. A1c 6.1. Diabetes is well controlled.  

## 2020-12-23 NOTE — Telephone Encounter (Signed)
Attempted to call pt regarding lab results no answer left VM. Will try to call pt again later.

## 2020-12-23 NOTE — Telephone Encounter (Signed)
-----   Message from Claiborne Rigg, NP sent at 12/23/2020  8:23 AM EDT ----- Labs show mild anemia.  Make sure you are taking your iron every day as prescribed. A1c 6.1. Diabetes is well controlled.

## 2020-12-23 NOTE — Telephone Encounter (Signed)
Pt informed of lab results verbalized understanding no other concerns voiced.  

## 2020-12-24 ENCOUNTER — Emergency Department (HOSPITAL_COMMUNITY): Payer: Medicaid Other

## 2020-12-24 ENCOUNTER — Observation Stay (HOSPITAL_COMMUNITY)
Admission: EM | Admit: 2020-12-24 | Discharge: 2020-12-25 | Disposition: A | Discharge status: Home / Self Care | Payer: Medicaid Other | Attending: Internal Medicine | Admitting: Internal Medicine

## 2020-12-24 DIAGNOSIS — I480 Paroxysmal atrial fibrillation: Secondary | ICD-10-CM | POA: Diagnosis present

## 2020-12-24 DIAGNOSIS — Z87891 Personal history of nicotine dependence: Secondary | ICD-10-CM | POA: Diagnosis not present

## 2020-12-24 DIAGNOSIS — R0789 Other chest pain: Secondary | ICD-10-CM | POA: Diagnosis not present

## 2020-12-24 DIAGNOSIS — Z86711 Personal history of pulmonary embolism: Secondary | ICD-10-CM | POA: Diagnosis not present

## 2020-12-24 DIAGNOSIS — Z79899 Other long term (current) drug therapy: Secondary | ICD-10-CM | POA: Diagnosis not present

## 2020-12-24 DIAGNOSIS — R0603 Acute respiratory distress: Secondary | ICD-10-CM | POA: Diagnosis not present

## 2020-12-24 DIAGNOSIS — Z7901 Long term (current) use of anticoagulants: Secondary | ICD-10-CM | POA: Diagnosis not present

## 2020-12-24 DIAGNOSIS — G4733 Obstructive sleep apnea (adult) (pediatric): Secondary | ICD-10-CM

## 2020-12-24 DIAGNOSIS — J9620 Acute and chronic respiratory failure, unspecified whether with hypoxia or hypercapnia: Secondary | ICD-10-CM | POA: Diagnosis not present

## 2020-12-24 DIAGNOSIS — I110 Hypertensive heart disease with heart failure: Secondary | ICD-10-CM | POA: Diagnosis not present

## 2020-12-24 DIAGNOSIS — I4891 Unspecified atrial fibrillation: Secondary | ICD-10-CM

## 2020-12-24 DIAGNOSIS — Z20822 Contact with and (suspected) exposure to covid-19: Secondary | ICD-10-CM | POA: Diagnosis not present

## 2020-12-24 DIAGNOSIS — I161 Hypertensive emergency: Principal | ICD-10-CM | POA: Insufficient documentation

## 2020-12-24 DIAGNOSIS — K219 Gastro-esophageal reflux disease without esophagitis: Secondary | ICD-10-CM | POA: Diagnosis present

## 2020-12-24 DIAGNOSIS — R0602 Shortness of breath: Secondary | ICD-10-CM | POA: Diagnosis not present

## 2020-12-24 DIAGNOSIS — E119 Type 2 diabetes mellitus without complications: Secondary | ICD-10-CM | POA: Insufficient documentation

## 2020-12-24 DIAGNOSIS — E785 Hyperlipidemia, unspecified: Secondary | ICD-10-CM | POA: Diagnosis present

## 2020-12-24 DIAGNOSIS — I517 Cardiomegaly: Secondary | ICD-10-CM | POA: Diagnosis not present

## 2020-12-24 DIAGNOSIS — E662 Morbid (severe) obesity with alveolar hypoventilation: Secondary | ICD-10-CM | POA: Diagnosis present

## 2020-12-24 DIAGNOSIS — Z794 Long term (current) use of insulin: Secondary | ICD-10-CM

## 2020-12-24 DIAGNOSIS — Z7984 Long term (current) use of oral hypoglycemic drugs: Secondary | ICD-10-CM | POA: Diagnosis not present

## 2020-12-24 DIAGNOSIS — Z8249 Family history of ischemic heart disease and other diseases of the circulatory system: Secondary | ICD-10-CM | POA: Diagnosis not present

## 2020-12-24 DIAGNOSIS — R079 Chest pain, unspecified: Secondary | ICD-10-CM | POA: Diagnosis not present

## 2020-12-24 DIAGNOSIS — G4730 Sleep apnea, unspecified: Secondary | ICD-10-CM

## 2020-12-24 DIAGNOSIS — Z9981 Dependence on supplemental oxygen: Secondary | ICD-10-CM

## 2020-12-24 DIAGNOSIS — I5032 Chronic diastolic (congestive) heart failure: Secondary | ICD-10-CM | POA: Diagnosis not present

## 2020-12-24 DIAGNOSIS — Z8616 Personal history of COVID-19: Secondary | ICD-10-CM | POA: Diagnosis not present

## 2020-12-24 DIAGNOSIS — Z833 Family history of diabetes mellitus: Secondary | ICD-10-CM | POA: Diagnosis not present

## 2020-12-24 DIAGNOSIS — I213 ST elevation (STEMI) myocardial infarction of unspecified site: Secondary | ICD-10-CM | POA: Diagnosis not present

## 2020-12-24 DIAGNOSIS — Z20828 Contact with and (suspected) exposure to other viral communicable diseases: Secondary | ICD-10-CM | POA: Diagnosis not present

## 2020-12-24 DIAGNOSIS — J9600 Acute respiratory failure, unspecified whether with hypoxia or hypercapnia: Secondary | ICD-10-CM | POA: Diagnosis present

## 2020-12-24 DIAGNOSIS — R4020 Unspecified coma: Secondary | ICD-10-CM | POA: Diagnosis not present

## 2020-12-24 DIAGNOSIS — Z6841 Body Mass Index (BMI) 40.0 and over, adult: Secondary | ICD-10-CM

## 2020-12-24 DIAGNOSIS — R9431 Abnormal electrocardiogram [ECG] [EKG]: Secondary | ICD-10-CM | POA: Diagnosis not present

## 2020-12-24 LAB — CBC WITH DIFFERENTIAL/PLATELET
Abs Immature Granulocytes: 0.04 10*3/uL (ref 0.00–0.07)
Basophils Absolute: 0 10*3/uL (ref 0.0–0.1)
Basophils Relative: 0 %
Eosinophils Absolute: 0.4 10*3/uL (ref 0.0–0.5)
Eosinophils Relative: 5 %
HCT: 42 % (ref 39.0–52.0)
Hemoglobin: 12.8 g/dL — ABNORMAL LOW (ref 13.0–17.0)
Immature Granulocytes: 1 %
Lymphocytes Relative: 27 %
Lymphs Abs: 2 10*3/uL (ref 0.7–4.0)
MCH: 25.1 pg — ABNORMAL LOW (ref 26.0–34.0)
MCHC: 30.5 g/dL (ref 30.0–36.0)
MCV: 82.4 fL (ref 80.0–100.0)
Monocytes Absolute: 0.8 10*3/uL (ref 0.1–1.0)
Monocytes Relative: 11 %
Neutro Abs: 4.1 10*3/uL (ref 1.7–7.7)
Neutrophils Relative %: 56 %
Platelets: 378 10*3/uL (ref 150–400)
RBC: 5.1 MIL/uL (ref 4.22–5.81)
RDW: 15.8 % — ABNORMAL HIGH (ref 11.5–15.5)
WBC: 7.3 10*3/uL (ref 4.0–10.5)
nRBC: 0 % (ref 0.0–0.2)

## 2020-12-24 LAB — COMPREHENSIVE METABOLIC PANEL
ALT: 14 U/L (ref 0–44)
AST: 20 U/L (ref 15–41)
Albumin: 3.4 g/dL — ABNORMAL LOW (ref 3.5–5.0)
Alkaline Phosphatase: 44 U/L (ref 38–126)
Anion gap: 5 (ref 5–15)
BUN: 15 mg/dL (ref 6–20)
CO2: 30 mmol/L (ref 22–32)
Calcium: 9 mg/dL (ref 8.9–10.3)
Chloride: 103 mmol/L (ref 98–111)
Creatinine, Ser: 1.12 mg/dL (ref 0.61–1.24)
GFR, Estimated: >60 mL/min (ref >60)
Glucose, Bld: 73 mg/dL (ref 70–99)
Potassium: 4.2 mmol/L (ref 3.5–5.1)
Sodium: 138 mmol/L (ref 135–145)
Total Bilirubin: 0.4 mg/dL (ref 0.3–1.2)
Total Protein: 7.2 g/dL (ref 6.5–8.1)

## 2020-12-24 LAB — I-STAT ARTERIAL BLOOD GAS, ED
Acid-Base Excess: 5 mmol/L — ABNORMAL HIGH (ref 0.0–2.0)
Acid-Base Excess: 6 mmol/L — ABNORMAL HIGH (ref 0.0–2.0)
Bicarbonate: 32.8 mmol/L — ABNORMAL HIGH (ref 20.0–28.0)
Bicarbonate: 34.3 mmol/L — ABNORMAL HIGH (ref 20.0–28.0)
Calcium, Ion: 1.27 mmol/L (ref 1.15–1.40)
Calcium, Ion: 1.29 mmol/L (ref 1.15–1.40)
HCT: 46 % (ref 39.0–52.0)
HCT: 48 % (ref 39.0–52.0)
Hemoglobin: 15.6 g/dL (ref 13.0–17.0)
Hemoglobin: 16.3 g/dL (ref 13.0–17.0)
O2 Saturation: 94 %
O2 Saturation: 98 %
Potassium: 4.2 mmol/L (ref 3.5–5.1)
Potassium: 4.2 mmol/L (ref 3.5–5.1)
Sodium: 140 mmol/L (ref 135–145)
Sodium: 139 mmol/L (ref 135–145)
TCO2: 35 mmol/L — ABNORMAL HIGH (ref 22–32)
TCO2: 36 mmol/L — ABNORMAL HIGH (ref 22–32)
pCO2 arterial: 61.5 mmHg — ABNORMAL HIGH (ref 32.0–48.0)
pCO2 arterial: 61.8 mmHg — ABNORMAL HIGH (ref 32.0–48.0)
pH, Arterial: 7.335 — ABNORMAL LOW (ref 7.350–7.450)
pH, Arterial: 7.352 (ref 7.350–7.450)
pO2, Arterial: 77 mmHg — ABNORMAL LOW (ref 83.0–108.0)
pO2, Arterial: 107 mmHg (ref 83.0–108.0)

## 2020-12-24 LAB — RESP PANEL BY RT-PCR (FLU A&B, COVID) ARPGX2
Influenza A by PCR: NEGATIVE
Influenza B by PCR: NEGATIVE
SARS Coronavirus 2 by RT PCR: NEGATIVE

## 2020-12-24 LAB — URINALYSIS, ROUTINE W REFLEX MICROSCOPIC
Bilirubin Urine: NEGATIVE
Glucose, UA: NEGATIVE mg/dL
Hgb urine dipstick: NEGATIVE
Ketones, ur: NEGATIVE mg/dL
Leukocytes,Ua: NEGATIVE
Nitrite: NEGATIVE
Protein, ur: NEGATIVE mg/dL
Specific Gravity, Urine: 1.013 (ref 1.005–1.030)
pH: 6 (ref 5.0–8.0)

## 2020-12-24 LAB — RAPID URINE DRUG SCREEN, HOSP PERFORMED
Amphetamines: NOT DETECTED
Barbiturates: NOT DETECTED
Benzodiazepines: NOT DETECTED
Cocaine: POSITIVE — AB
Opiates: NOT DETECTED
Tetrahydrocannabinol: NOT DETECTED

## 2020-12-24 LAB — ETHANOL: Alcohol, Ethyl (B): <10 mg/dL (ref <10)

## 2020-12-24 LAB — BRAIN NATRIURETIC PEPTIDE: B Natriuretic Peptide: 14.4 pg/mL (ref 0.0–100.0)

## 2020-12-24 LAB — TROPONIN I (HIGH SENSITIVITY)
Troponin I (High Sensitivity): 28 ng/L — ABNORMAL HIGH (ref <18)
Troponin I (High Sensitivity): 24 ng/L — ABNORMAL HIGH (ref <18)

## 2020-12-24 MED ORDER — IPRATROPIUM-ALBUTEROL 0.5-2.5 (3) MG/3ML IN SOLN: 3.0000 mL | Freq: Four times a day (QID) | RESPIRATORY_TRACT | Status: DC | PRN

## 2020-12-24 MED ORDER — ACETAMINOPHEN 325 MG PO TABS: 650.0000 mg | ORAL_TABLET | Freq: Four times a day (QID) | ORAL | Status: DC | PRN

## 2020-12-24 MED ORDER — ENOXAPARIN SODIUM 300 MG/3ML IJ SOLN
1.0000 mg/kg | Freq: Two times a day (BID) | INTRAMUSCULAR | Status: DC
  Administered 2020-12-24: 165 mg via SUBCUTANEOUS
  Filled 2020-12-24 (×5): qty 1.65

## 2020-12-24 MED ORDER — NITROGLYCERIN IN D5W 200-5 MCG/ML-% IV SOLN
0.0000 ug/min | INTRAVENOUS | Status: DC
Start: 2020-12-24 — End: 2020-12-25
  Administered 2020-12-24: 5 ug/min via INTRAVENOUS
  Filled 2020-12-24: qty 250

## 2020-12-24 MED ORDER — METHYLPREDNISOLONE SODIUM SUCC 125 MG IJ SOLR
125.0000 mg | INTRAMUSCULAR | Status: AC
  Administered 2020-12-24: 125 mg via INTRAVENOUS
  Filled 2020-12-24: qty 2

## 2020-12-24 MED ORDER — POLYETHYLENE GLYCOL 3350 17 G PO PACK: 17.0000 g | PACK | Freq: Every day | ORAL | Status: DC | PRN

## 2020-12-24 MED ORDER — ACETAMINOPHEN 650 MG RE SUPP: 650.0000 mg | Freq: Four times a day (QID) | RECTAL | Status: DC | PRN

## 2020-12-24 MED ORDER — FUROSEMIDE 10 MG/ML IJ SOLN
40.0000 mg | Freq: Once | INTRAMUSCULAR | Status: AC
  Administered 2020-12-24: 40 mg via INTRAVENOUS
  Filled 2020-12-24: qty 4

## 2020-12-24 MED ORDER — ALBUTEROL SULFATE (2.5 MG/3ML) 0.083% IN NEBU
5.0000 mg | INHALATION_SOLUTION | Freq: Once | RESPIRATORY_TRACT | Status: DC
  Filled 2020-12-24: qty 6

## 2020-12-24 NOTE — Progress Notes (Signed)
RN called RT, stating that pt mentioned his mask was too tight. RT changed out mask to XL mask. Pt tolerated well.

## 2020-12-24 NOTE — ED Triage Notes (Signed)
Pt arrived by EMS from home c/o shortness of breath and chest pain for the past couple of mornings. States he has not had his bipap machine for a while.  Pt is falling asleep during triage but oriented when awake.   Pt complaining of left arm and chest pain at this time Medics gave 324 asa and 1 sublingual nitro tablet.
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
## 2020-12-24 NOTE — ED Notes (Signed)
Attempted to give report at this time 

## 2020-12-24 NOTE — Progress Notes (Signed)
RT transported patient to 2W10 from ER on BiPAP with no complications.

## 2020-12-24 NOTE — ED Provider Notes (Signed)
[name] EMERGENCY DEPARTMENT Provider Note   CSN: [id number] Arrival date & time: 12/24/20  1011     History Chief Complaint  Patient presents with  . Chest Pain  . Shortness of Breath    Benjamin [name] is a 55 y.o. male.  HPI Patient presents from home via EMS due to concern for chest pain, dyspnea. History is obtained by the patient and EMS providers.  Interestingly, the patient is asleep, but awakens with minimal stimuli, provides details of his presentation, but falls asleep again quickly.  Similarly, the patient has multiple medical issues including CHF, A. fib, is on oxygen 24/7.  He notes that he has been taking his medication regularly.  However, the patient lost his BiPAP which he uses for sleep apnea.  He notes that he typically awakens with chest pain and dyspnea, but today had more, severe, superior thoracic, pressure-like, not improved with anything, pain.  Dyspnea is not clearly different from normal, and he continues to wear her typical amount of home oxygen. EMS notes the patient was hypertensive in route, received aspirin, nitroglycerin, did not have appreciable change in his pain.    Past Medical History:  Diagnosis Date  . A-fib ([place])   . Anemia 05/29/2019  . CHF (congestive heart failure) ([place])   . Chronic respiratory failure ([place])   . Diabetes mellitus without complication ([place])   . Dyspnea   . Elevated troponin 02/06/2019  . Hypertension   . Obesity   . Pulmonary embolism ([place])   . Rectal bleeding 05/29/2019  . Sleep apnea     Patient Active Problem List   Diagnosis Date Noted  . Polysubstance abuse ([place]) 08/20/2020  . Alcohol abuse 08/20/2020  . Atrial fibrillation with rapid ventricular response ([place]) 08/19/2020  . Obesity hypoventilation syndrome ([place]) 06/12/2020  . Healthcare maintenance 06/12/2020  . Acute on chronic diastolic heart failure ([place]) 02/24/2020  . Diabetic neuropathy ([place]) 11/20/2019  . Coagulation  disorder ([place]) 11/20/2019  . Acute on chronic diastolic CHF (congestive heart failure), NYHA class 3 ([place]) 07/30/2019  . Diabetes mellitus without complication ([place]) 21/97/5883  . Hypertensive emergency   . BRBPR (bright red blood per rectum) 06/12/2019  . Hypertension   . CHF (congestive heart failure) ([place])   . Sleep apnea   . Chronic respiratory failure ([place])   . Hypokalemia 05/29/2019  . GI bleed 05/29/2019  . Anemia 05/28/2019  . Acute on chronic diastolic CHF (congestive heart failure) ([place]) 04/11/2019  . Iron deficiency anemia 03/11/2019  . OSA on CPAP 03/11/2019  . HLD (hyperlipidemia) 03/11/2019  . Elevated troponin 03/11/2019  . GERD (gastroesophageal reflux disease) 03/11/2019  . Rectal bleeding 02/20/2019  . Dyspnea 02/06/2019  . COVID-19 virus infection 02/06/2019  . Bilateral lower extremity edema   . Morbid obesity with BMI of 50.0-59.9, adult ([place])   . Chronic diastolic CHF (congestive heart failure) ([place])   . PAF (paroxysmal atrial fibrillation) ([place])   . PE (pulmonary thromboembolism) ([place]) 01/21/2019  . Hypertensive urgency 01/21/2019  . Diabetes mellitus type 2 in obese ([place]) 01/21/2019    Past Surgical History:  Procedure Laterality Date  . CARDIOVERSION N/A 08/21/2020   Procedure: CARDIOVERSION;  Surgeon: Larey Dresser, MD;  Location: [facility] ENDOSCOPY;  Service: Cardiovascular;  Laterality: N/A;  . COLONOSCOPY WITH PROPOFOL Left 02/25/2019   Procedure: COLONOSCOPY WITH PROPOFOL;  Surgeon: Arta Silence, MD;  Location: [place] [facility];  Service: Endoscopy;  Laterality: Left;  . ESOPHAGOGASTRODUODENOSCOPY (EGD) WITH PROPOFOL Left 02/25/2019  Procedure: ESOPHAGOGASTRODUODENOSCOPY (EGD) WITH PROPOFOL;  Surgeon: Arta Silence, MD;  Location: [facility] ENDOSCOPY;  Service: Endoscopy;  Laterality: Left;  . ESOPHAGOGASTRODUODENOSCOPY (EGD) WITH PROPOFOL N/A 06/12/2019   Procedure: ESOPHAGOGASTRODUODENOSCOPY (EGD) WITH PROPOFOL;  Surgeon: Clarene Essex, MD;  Location: [facility];  Service: Gastroenterology;  Laterality: N/A;  . FLEXIBLE SIGMOIDOSCOPY N/A 06/12/2019   Procedure: FLEXIBLE SIGMOIDOSCOPY;  Surgeon: Clarene Essex, MD;  Location: [facility];  Service: Gastroenterology;  Laterality: N/A;  . HEMORRHOID BANDING  05/2019  . NO PAST SURGERIES    . RIGHT HEART CATH N/A 04/17/2019   Procedure: RIGHT HEART CATH;  Surgeon: Larey Dresser, MD;  Location: [facility] CV LAB;  Service: Cardiovascular;  Laterality: N/A;  . TEE WITHOUT CARDIOVERSION N/A 08/21/2020   Procedure: TRANSESOPHAGEAL ECHOCARDIOGRAM (TEE);  Surgeon: Larey Dresser, MD;  Location: [facility] ENDOSCOPY;  Service: Cardiovascular;  Laterality: N/A;       Family History  Problem Relation Age of Onset  . Hypertension Mother   . Diabetes Mother     Social History   Tobacco Use  . Smoking status: Former Research scientist (life sciences)  . Smokeless tobacco: Never Used  . Tobacco comment: smoked weed in the past  Vaping Use  . Vaping Use: Never used  Substance Use Topics  . Alcohol use: Not Currently  . Drug use: Not Currently    Types: Marijuana    Home Medications Prior to Admission medications   Medication Sig Start Date End Date Taking? Authorizing Provider  atorvastatin (LIPITOR) 40 MG tablet Take 1 tablet (40 mg total) by mouth at bedtime. 09/22/20 12/21/20  [name], NP  Blood Glucose Monitoring Suppl (TRUE METRIX METER) w/Device KIT Use as instructed. Check blood glucose level by fingerstick twice per day. 03/18/19   [name], NP  Blood Pressure Monitor DEVI Please provide patient with insurance approved blood pressure device with L-XL cuff. BMI 55 Patient taking differently: 1 each by Other route See admin instructions. Please provide patient with insurance approved blood pressure device with L-XL cuff. BMI 55 10/04/19   [name], NP  carboxymethylcellulose (REFRESH PLUS) 0.5 % SOLN Place 1 drop into both eyes daily.    [provider]  diltiazem (CARDIZEM CD) 240 MG 24 hr  capsule Take 1 capsule (240 mg total) by mouth daily. 08/25/20   Nita Sells, MD  doxazosin (CARDURA) 2 MG tablet Take 1 tablet (2 mg total) by mouth daily. 07/02/20 07/02/21  Lyda Jester M, PA-C  ferrous sulfate 325 (65 FE) MG tablet Take 1 tablet (325 mg total) by mouth daily. 12/23/20 03/23/21  [name], NP  glucose blood (TRUE METRIX BLOOD GLUCOSE TEST) test strip Use as instructed. Check blood glucose level by fingerstick twice per day. 09/22/20   [name], NP  hydrALAZINE (APRESOLINE) 50 MG tablet Take 1 tablet (50 mg total) by mouth 3 (three) times daily. 09/17/20   Larey Dresser, MD  labetalol (NORMODYNE) 200 MG tablet TAKE ONE TABLET BY MOUTH TWICE DAILY ( NOON +BEDTIME) 12/14/20   Larey Dresser, MD  losartan (COZAAR) 100 MG tablet Take 1 tablet (100 mg total) by mouth daily. 05/04/20   Clegg, Amy D, NP  Lumateperone Tosylate (CAPLYTA) 42 MG CAPS Take 42 mg by mouth at bedtime. 10/09/20   Bensimhon, Shaune Pascal, MD  magnesium oxide (MAG-OX) 400 (241.3 Mg) MG tablet TAKE 2 TABLETS BY MOUTH EVERY MORNING AND 1 TABLET EVERY EVENING NOON+PM+BEDTIME) 11/04/20   Larey Dresser, MD  metFORMIN (GLUCOPHAGE)  500 MG tablet TAKE ONE TABLET BY MOUTH TWICE DAILY WITH A MEAL (NOON+BEDTIME) 12/14/20   [name], NP  Misc. Devices MISC Requires O2 @ 3L/min continuously via nasal canula and home fill system,. Patient taking differently: 1 each by Other route See admin instructions. Requires O2 @ 3L/min continuously via nasal canula and home fill system,. 07/03/19   [name], NP  Misc. Devices MISC Please provide Mr. Krolak with insurance approved portable O2 concentrator ICD 10 J96.11 Z99.81 Patient taking differently: 1 each by Other route See admin instructions. Please provide Mr. Linse with insurance approved portable O2 concentrator ICD 10 J96.11 Z99.81 08/27/19   [name], NP  Misc. Devices MISC Please provide patient with insurance approved LARGE right  hand splint ICD 10 G56.01 11/16/20   [name], NP  omeprazole (PRILOSEC) 20 MG capsule TAKE 1 CAPSULE BY MOUTH 2 (TWO) TIMES DAILY BEFORE A MEAL. TAKE FIRST CAPSULE 30 MIN PRIOR TO EATING OR TAKING OTHER MEDICATIONS. (AM+BEDTIME) 08/24/20   [name], NP  potassium chloride (KLOR-CON) 10 MEQ tablet Take 8 tablets (80 mEq total) by mouth daily. 10/20/20   [place], Maricela Bo, FNP  PROVENTIL HFA 108 (90 Base) MCG/ACT inhaler INHALE 2 PUFFS BY MOUTH EVERY 6 (SIX) HOURS AS NEEDED FOR WHEEZING OR SHORTNESS OF BREATH. 09/22/20   [name], NP  spironolactone (ALDACTONE) 50 MG tablet TAKE 1 TABLET (50 MG TOTAL) BY MOUTH DAILY (NOON) 08/25/20   Bensimhon, Shaune Pascal, MD  torsemide [facility]) 20 MG tablet Take 64m by mouth twice daily 09/17/20   MLarey Dresser MD  TRUEplus Lancets 28G MISC Use as instructed. Check blood glucose level by fingerstick twice per day. 09/22/20   FGildardo Pounds NP  XARELTO 20 MG TABS tablet TAKE 1 TABLET BY MOUTH DAILY WITH SUPPER (BEDTIME) 07/14/20   SConsuelo Pandy PA-C    Allergies    Patient has no known allergies.  Review of Systems   Review of Systems  Constitutional:       Per HPI, otherwise negative  HENT:       Per HPI, otherwise negative  Respiratory:       Per HPI, otherwise negative  Cardiovascular:       Per HPI, otherwise negative  Gastrointestinal: Negative for vomiting.  Endocrine:       Negative aside from HPI  Genitourinary:       Neg aside from HPI   Musculoskeletal:       Per HPI, otherwise negative  Skin: Negative.   Neurological: Negative for syncope.    Physical Exam Updated Vital Signs BP (!) 212/133   Pulse 72   Temp (!) (P) 96.1 F (35.6 C) (Axillary)   Resp 15   SpO2 100%   Physical Exam Vitals and nursing note reviewed.  Constitutional:      Appearance: He is well-developed. He is obese. He is ill-appearing and diaphoretic.     Comments: Patient falls asleep again after conversation, but awakens with  minimal stimuli  HENT:     Head: Normocephalic and atraumatic.  Eyes:     Conjunctiva/sclera: Conjunctivae normal.  Cardiovascular:     Rate and Rhythm: Regular rhythm. Tachycardia present.  Pulmonary:     Effort: Tachypnea and accessory muscle usage present.  Abdominal:     General: There is no distension.  Musculoskeletal:     Right lower leg: Edema present.     Left lower leg: Edema present.  Skin:  General: Skin is warm.  Neurological:     Mental Status: He is alert and oriented to person, place, and time.     ED Results / Procedures / Treatments   Labs (all labs ordered are listed, but only abnormal results are displayed) Labs Reviewed  COMPREHENSIVE METABOLIC PANEL - Abnormal; Notable for the following components:      Result Value   Albumin 3.4 (*)    All other components within normal limits  CBC WITH DIFFERENTIAL/PLATELET - Abnormal; Notable for the following components:   Hemoglobin 12.8 (*)    MCH 25.1 (*)    RDW 15.8 (*)    All other components within normal limits  TROPONIN I (HIGH SENSITIVITY) - Abnormal; Notable for the following components:   Troponin I (High Sensitivity) 28 (*)    All other components within normal limits  BRAIN NATRIURETIC PEPTIDE  URINALYSIS, ROUTINE W REFLEX MICROSCOPIC  BLOOD GAS, ARTERIAL  POC SARS CORONAVIRUS 2 AG -  ED  TROPONIN I (HIGH SENSITIVITY)    EKG EKG Interpretation  Date/Time:  Thursday December 24 2020 10:40:12 EDT Ventricular Rate:  76 PR Interval:  150 QRS Duration: 94 QT Interval:  336 QTC Calculation: 378 R Axis:   59 Text Interpretation: Normal sinus rhythm Nonspecific T wave abnormality Abnormal ECG Confirmed by Carmin Muskrat ([id number]) on 12/24/2020 10:42:17 AM   EMS rhythm strip, rate 80, hypertrophic changes, with ST abnormalities anteriorly, no appreciable depression.  Radiology DG Chest Port 1 View  Result Date: 12/24/2020 CLINICAL DATA:  55 year old male with shortness of breath. EXAM: PORTABLE  CHEST 1 VIEW COMPARISON:  Chest radiographs 10/26/2020 and earlier. FINDINGS: Portable AP upright view at 1033 hours. Stable lung volumes, mild cardiomegaly. Regressed but not resolved pulmonary interstitial opacity diffusely with basilar lung predominance. No pneumothorax, pleural effusion or consolidation. Visualized tracheal air column is within normal limits. No acute osseous abnormality identified. IMPRESSION: Continued low lung volumes with regressed but not resolved pulmonary interstitial edema when compared to February. Mild cardiomegaly. No pleural effusion is evident. Electronically Signed   By: Genevie Ann M.D.   On: 12/24/2020 11:00    Procedures Procedures   Medications Ordered in ED Medications  methylPREDNISolone sodium succinate (SOLU-MEDROL) 125 mg/2 mL injection 125 mg (has no administration in time range)  albuterol (PROVENTIL) (2.5 MG/3ML) 0.083% nebulizer solution 5 mg (has no administration in time range)  furosemide (LASIX) injection 40 mg (has no administration in time range)  nitroGLYCERIN 50 mg in dextrose 5 % 250 mL (0.2 mg/mL) infusion (has no administration in time range)    ED Course  I have reviewed the triage vital signs and the nursing notes.  Pertinent labs & imaging results that were available during my care of the patient were reviewed by me and considered in my medical decision making (see chart for details).   Consideration of hypertensive urgency versus decompensated heart failure the patient was placed on continuous cardiac monitoring, pulse oximetry. With 3 L.  Pulse oximetry saturation 95%. Cardiac 80s, sinus, unremarkable. 10:42 AM I reviewed the patient's presentation at bedside with one of our cardiology colleagues. She and I discussed rhythm strip, as EKG was being performed. EKG without obvious criteria for STEMI, but with changes consistent with hypertension, as well as nonspecific T wave changes.   2:33 PM Patient remains hypertensive,  somnolent, but awakens easily. Initial troponin down from prior, but elevated. Other labs generally reassuring, renal function reasonable. He remains persistently hypertensive, 239 systolic.  Nitro drip starting, Lasix  ordered  3:32 PM Patient much more awake. ABG reviewed, no substantial acidosis, though he does have some evidence for hypercarbia. He will start BiPAP. Adult obese male presents with respiratory distress, concern for hypertensive emergency given substantial hypertension, no oxygen requirement. Patient has little evidence for concurrent infection, required initiation of additional oxygen supplementation, nitroglycerin drip, IV diuretics.  Patient will require admission to stepdown unit for further monitoring, management. MDM Rules/Calculators/A&P MDM Number of Diagnoses or Management Options Hypertensive emergency: new, needed workup Respiratory distress: new, needed workup   Amount and/or Complexity of Data Reviewed Clinical lab tests: reviewed and ordered Tests in the radiology section of CPT: reviewed and ordered Tests in the medicine section of CPT: reviewed and ordered Decide to obtain previous medical records or to obtain history from someone other than the patient: yes Review and summarize past medical records: yes Discuss the patient with other providers: yes Independent visualization of images, tracings, or specimens: yes  Risk of Complications, Morbidity, and/or Mortality Presenting problems: high Diagnostic procedures: high Management options: high  Critical Care Total time providing critical care: 30-74 minutes (35)  Patient Progress Patient progress: stable   Final Clinical Impression(s) / ED Diagnoses Final diagnoses:  Hypertensive emergency  Respiratory distress     Carmin Muskrat, MD 12/24/20 [phone]
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
## 2020-12-24 NOTE — ED Notes (Signed)
Pt asleep in room, sitting straight up.  Pt snoring with long deep respirations.

## 2020-12-24 NOTE — ED Notes (Signed)
Attempted report at this time.

## 2020-12-24 NOTE — Progress Notes (Signed)
Per MD order, RT started pt on BiPAP. Pt tolerating well at this time. RT will continue to monitor pt.

## 2020-12-24 NOTE — Progress Notes (Signed)
RT removed pt from Servo-I BiPAP and placed pt on V60 BiPAP due to pt's negative covid test result.

## 2020-12-25 ENCOUNTER — Other Ambulatory Visit: Payer: Self-pay

## 2020-12-25 ENCOUNTER — Encounter (HOSPITAL_COMMUNITY): Payer: Self-pay | Admitting: Internal Medicine

## 2020-12-25 DIAGNOSIS — Z794 Long term (current) use of insulin: Secondary | ICD-10-CM | POA: Diagnosis not present

## 2020-12-25 DIAGNOSIS — I4891 Unspecified atrial fibrillation: Secondary | ICD-10-CM | POA: Diagnosis not present

## 2020-12-25 DIAGNOSIS — E119 Type 2 diabetes mellitus without complications: Secondary | ICD-10-CM | POA: Diagnosis not present

## 2020-12-25 DIAGNOSIS — R079 Chest pain, unspecified: Secondary | ICD-10-CM | POA: Diagnosis not present

## 2020-12-25 DIAGNOSIS — G4730 Sleep apnea, unspecified: Secondary | ICD-10-CM | POA: Diagnosis not present

## 2020-12-25 DIAGNOSIS — I161 Hypertensive emergency: Secondary | ICD-10-CM | POA: Diagnosis not present

## 2020-12-25 LAB — CBC
HCT: 45.5 % (ref 39.0–52.0)
Hemoglobin: 13.7 g/dL (ref 13.0–17.0)
MCH: 24.7 pg — ABNORMAL LOW (ref 26.0–34.0)
MCHC: 30.1 g/dL (ref 30.0–36.0)
MCV: 82.1 fL (ref 80.0–100.0)
Platelets: 409 10*3/uL — ABNORMAL HIGH (ref 150–400)
RBC: 5.54 MIL/uL (ref 4.22–5.81)
RDW: 15.6 % — ABNORMAL HIGH (ref 11.5–15.5)
WBC: 10.3 10*3/uL (ref 4.0–10.5)
nRBC: 0 % (ref 0.0–0.2)

## 2020-12-25 LAB — BASIC METABOLIC PANEL
Anion gap: 5 (ref 5–15)
BUN: 16 mg/dL (ref 6–20)
CO2: 33 mmol/L — ABNORMAL HIGH (ref 22–32)
Calcium: 9.3 mg/dL (ref 8.9–10.3)
Chloride: 99 mmol/L (ref 98–111)
Creatinine, Ser: 1.17 mg/dL (ref 0.61–1.24)
GFR, Estimated: >60 mL/min (ref >60)
Glucose, Bld: 123 mg/dL — ABNORMAL HIGH (ref 70–99)
Potassium: 4.2 mmol/L (ref 3.5–5.1)
Sodium: 137 mmol/L (ref 135–145)

## 2020-12-25 MED ORDER — LOSARTAN POTASSIUM 50 MG PO TABS
100.0000 mg | ORAL_TABLET | Freq: Every day | ORAL | Status: DC
  Administered 2020-12-25: 100 mg via ORAL
  Filled 2020-12-25: qty 2

## 2020-12-25 MED ORDER — MAGNESIUM OXIDE 400 (241.3 MG) MG PO TABS
400.0000 mg | ORAL_TABLET | Freq: Three times a day (TID) | ORAL | Status: DC
  Administered 2020-12-25: 400 mg via ORAL
  Filled 2020-12-25: qty 1

## 2020-12-25 MED ORDER — POTASSIUM CHLORIDE ER 10 MEQ PO TBCR
80.0000 meq | EXTENDED_RELEASE_TABLET | Freq: Every day | ORAL | Status: DC
  Administered 2020-12-25: 80 meq via ORAL
  Filled 2020-12-25 (×2): qty 8

## 2020-12-25 MED ORDER — ATORVASTATIN CALCIUM 40 MG PO TABS: 40.0000 mg | ORAL_TABLET | Freq: Every day | ORAL | Status: DC

## 2020-12-25 MED ORDER — LABETALOL HCL 200 MG PO TABS
200.0000 mg | ORAL_TABLET | Freq: Two times a day (BID) | ORAL | Status: DC
  Administered 2020-12-25: 200 mg via ORAL
  Filled 2020-12-25: qty 1

## 2020-12-25 MED ORDER — DILTIAZEM HCL ER COATED BEADS 240 MG PO CP24
240.0000 mg | ORAL_CAPSULE | Freq: Every day | ORAL | Status: DC
  Administered 2020-12-25: 240 mg via ORAL
  Filled 2020-12-25: qty 1

## 2020-12-25 MED ORDER — HYDRALAZINE HCL 50 MG PO TABS
50.0000 mg | ORAL_TABLET | Freq: Three times a day (TID) | ORAL | Status: DC
  Administered 2020-12-25: 50 mg via ORAL
  Filled 2020-12-25: qty 1

## 2020-12-25 MED ORDER — TORSEMIDE 20 MG PO TABS
60.0000 mg | ORAL_TABLET | Freq: Two times a day (BID) | ORAL | Status: DC
  Administered 2020-12-25: 60 mg via ORAL
  Filled 2020-12-25 (×2): qty 3

## 2020-12-25 MED ORDER — DOXAZOSIN MESYLATE 2 MG PO TABS
2.0000 mg | ORAL_TABLET | Freq: Every day | ORAL | Status: DC
  Administered 2020-12-25: 2 mg via ORAL
  Filled 2020-12-25: qty 1

## 2020-12-25 MED ORDER — FERROUS SULFATE 325 (65 FE) MG PO TABS
325.0000 mg | ORAL_TABLET | Freq: Every day | ORAL | Status: DC
  Administered 2020-12-25: 325 mg via ORAL
  Filled 2020-12-25: qty 1

## 2020-12-25 MED ORDER — RIVAROXABAN 20 MG PO TABS
20.0000 mg | ORAL_TABLET | Freq: Every day | ORAL | Status: DC
  Administered 2020-12-25: 20 mg via ORAL
  Filled 2020-12-25: qty 1

## 2020-12-25 MED ORDER — PANTOPRAZOLE SODIUM 40 MG PO TBEC
40.0000 mg | DELAYED_RELEASE_TABLET | Freq: Every day | ORAL | Status: DC
  Administered 2020-12-25: 40 mg via ORAL
  Filled 2020-12-25: qty 1

## 2020-12-25 MED ORDER — SPIRONOLACTONE 25 MG PO TABS
50.0000 mg | ORAL_TABLET | Freq: Every day | ORAL | Status: DC
  Administered 2020-12-25: 50 mg via ORAL
  Filled 2020-12-25: qty 2
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
## 2020-12-25 NOTE — Progress Notes (Signed)
Patient discharged with all belongings.

## 2020-12-25 NOTE — Progress Notes (Signed)
PROGRESS NOTE  Benjamin [name] has obstructive sleep apnea and obesity hypoventilation syndrome. Patient's chronic respiratory failure due to these diagnoses is life threatening. Previous ABG's have documented high PCO2. BiPAP was tried and failed in the sleep lab setting. Patient would benefit from non-invasive ventilation. Without this therapy, the patient is at high risk of ending up with worsening symptoms, worsened respiratory failure, need for ER visits and/or recurrent hospitalizations. Bilevel device unable to adequately support patient's nocturnal ventilation needs. Patient would benefit from NIV therapy with set tidal volumes and pressure.  Plan: - Confirmation of verbal order for non-invasive mechanical ventilator signed and faxed to [place], [employer]. FAX [phone]  Alphonzo Severance, MD Internal Medicine Resident, PGY-1 [facility] Pager: [phone] 3:46 PM, 12/25/2020
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
## 2020-12-25 NOTE — TOC Initial Note (Signed)
Transition of Care [facility]) - Initial/Assessment Note    Patient Details  Name: Benjamin [name] MRN: [id number] Date of Birth: 18-Apr-1966  [facility] CM/SW Contact:    Joanne Chars, LCSW Phone Number: 12/25/2020, 3:19 PM  Clinical Narrative:   CSW messaged by MD Shon Baton regarding issues with pt bipap.  CSW met with pt and sig other Laqueta Jean, permission given to speak with Laqueta Jean present.  Pt calls his CSW at the heart failure clinic, [facility], [phone].  She reports they are working with [employer] to get pt a bipap and so far unsuccessful.     CSW spoke with Ashely at [facility].  He reports there have been supply chain issues, but there are other issues related to the ordered bipap from the pulmonologist not being able to produce the pressures that are being requested.  CSW reached back out to Dr Shon Baton with [facility] number for her to talk through these issues.  CSW also spoke with Zack at [facility] and they do have a bipap available if needed.                 Expected Discharge Plan: Home/Self Care Barriers to Discharge: Continued Medical Work up   Patient Goals and CMS Choice Patient states their goals for this hospitalization and ongoing recovery are:: "get my bipap" CMS Medicare.gov Compare Post Acute Care list provided to::  (na)    Expected Discharge Plan and Services Expected Discharge Plan: Home/Self Care     Post Acute Care Choice: Durable Medical Equipment Living arrangements for the past 2 months: Single Family Home                                      Prior Living Arrangements/Services Living arrangements for the past 2 months: Single Family Home Lives with:: Significant Other Patient language and need for interpreter reviewed:: Yes Do you feel safe going back to the place where you live?: Yes      Need for Family Participation in Patient Care: No (Comment) Care giver support system in place?: Yes (comment) Current home services: DME, pt  reports he has an EMT that comes and checks on him.  Criminal Activity/Legal Involvement Pertinent to Current Situation/Hospitalization: No - Comment as needed  Activities of Daily Living Home Assistive Devices/Equipment: None ADL Screening (condition at time of admission) Patient's cognitive ability adequate to safely complete daily activities?: Yes Is the patient deaf or have difficulty hearing?: No Does the patient have difficulty seeing, even when wearing glasses/contacts?: No Does the patient have difficulty concentrating, remembering, or making decisions?: No Patient able to express need for assistance with ADLs?: Yes Does the patient have difficulty dressing or bathing?: No Independently performs ADLs?: Yes (appropriate for developmental age) Does the patient have difficulty walking or climbing stairs?: No Weakness of Legs: None Weakness of Arms/Hands: None  Permission Sought/Granted Permission sought to share information with : Family Supports Permission granted to share information with : Yes, Verbal Permission Granted  Share Information with NAME: Ladeska, sig other           Emotional Assessment Appearance:: Appears stated age Attitude/Demeanor/Rapport: Engaged Affect (typically observed): Appropriate,Pleasant Orientation: : Oriented to Self,Oriented to Place,Oriented to  Time,Oriented to Situation Alcohol / Substance Use: Illicit Drugs Psych Involvement: No (comment)  Admission diagnosis:  Respiratory distress [R06.03] Hypertensive emergency [I16.1] Acute on chronic respiratory failure ([place]) [J96.20] Patient Active Problem List  Diagnosis Date Noted  . Acute on chronic respiratory failure ([place]) 12/24/2020  . Obstructive sleep apnea treated with BiPAP 12/24/2020  . Polysubstance abuse ([place]) 08/20/2020  . Alcohol abuse 08/20/2020  . Atrial fibrillation with rapid ventricular response ([place]) 08/19/2020  . Obesity hypoventilation syndrome ([place]) 06/12/2020  .  Healthcare maintenance 06/12/2020  . Acute on chronic diastolic heart failure ([place]) 02/24/2020  . Diabetic neuropathy ([place]) 11/20/2019  . Coagulation disorder ([place]) 11/20/2019  . Acute on chronic diastolic CHF (congestive heart failure), NYHA class 3 ([place]) 07/30/2019  . Diabetes mellitus without complication ([place]) 17/91/5056  . Hypertensive emergency   . BRBPR (bright red blood per rectum) 06/12/2019  . Hypertension   . CHF (congestive heart failure) ([place])   . Sleep apnea   . Chronic respiratory failure ([place])   . Hypokalemia 05/29/2019  . GI bleed 05/29/2019  . Anemia 05/28/2019  . Acute on chronic diastolic CHF (congestive heart failure) ([place]) 04/11/2019  . Iron deficiency anemia 03/11/2019  . OSA on CPAP 03/11/2019  . HLD (hyperlipidemia) 03/11/2019  . Elevated troponin 03/11/2019  . GERD (gastroesophageal reflux disease) 03/11/2019  . Rectal bleeding 02/20/2019  . Dyspnea 02/06/2019  . COVID-19 virus infection 02/06/2019  . Bilateral lower extremity edema   . Morbid obesity with BMI of 50.0-59.9, adult ([place])   . Chronic diastolic CHF (congestive heart failure) ([place])   . PAF (paroxysmal atrial fibrillation) ([place])   . PE (pulmonary thromboembolism) ([place]) 01/21/2019  . Hypertensive urgency 01/21/2019  . Diabetes mellitus type 2 in obese ([place]) 01/21/2019   PCP:  [name], NP Pharmacy:   [place], [place] - [address] [place] [address] Phone: [phone] Fax: [facility] [address] [place] [address] Phone: [phone] Fax: [phone]     Social Determinants of Health (SDOH) Interventions    Readmission Risk Interventions Readmission Risk Prevention Plan 07/18/2019 06/13/2019 04/19/2019  Transportation Screening Complete Complete Complete  PCP or Specialist Appt within 3-5 Days - - Complete  HRI or [place] - - Complete  Social  Work Consult for [place] Planning/Counseling - - Complete  Palliative Care Screening - - Not Applicable  Medication Review Press photographer) Complete Complete Complete  PCP or Specialist appointment within 3-5 days of discharge Complete Complete -  [place] or Home Care Consult Complete Complete -  SW Recovery Care/Counseling Consult Complete Complete -  Palliative Care Screening Not Applicable Not Applicable -  [place] Not Applicable Not Applicable -  Some recent data might be hidden

## 2020-12-25 NOTE — Progress Notes (Signed)
Night time provider, Gwyneth Revels, said to keep SBP >160 and to titrate nitro gtt accordingly.

## 2020-12-25 NOTE — Progress Notes (Signed)
Pt arrived to 2w10 at 1953 from ED. Report received from [facility]. Pt A&O x 4, drowsy but arrousable. Pt has IV x 1 and nitro gtt at 7.5 ml/hr. Pt skin assessed with Ivana RN; no skin issues found. Pt updated family via cell phone. Pt oriented to unit and room. Call bell given to pt, bed alarm on.

## 2020-12-25 NOTE — Discharge Instructions (Addendum)
Benjamin [name],  It was a pleasure taking care of you in the hospital.  You were admitted to the hospital with very high blood pressure and respiratory distress. Your high blood pressure was likely caused by your recently untreated sleep apnea as well as your cocaine use. You were put on BiPAP, and your respiratory status improved significantly. Your blood pressure was treated with a continuous intravenous blood pressure medicine. You were then transitioned back to your home blood pressure medications. FYI you already took one dose of your hydralazine, labetolol, spironolactone, and torsemide today. Please take your Xarelto when you get home. Please stop using cocaine.  We coordinated with [employer] to submit an application/order for noninvasive ventilation. Once the order is authorized by your insurance, it will take 1-10 business days for you to get the machine.   If you feel you are having a medical emergency please call 911.   Take care!

## 2020-12-25 NOTE — Hospital Course (Signed)
Benjamin [name],  It was a pleasure taking care of you in the hospital.  You were admitted to the hospital with very high blood pressure and respiratory distress. Your high blood pressure was likely caused by your recently untreated sleep apnea as well as your cocaine use. You were put on BiPAP, and your respiratory status improved significantly. Your blood pressure was treated with a continuous intravenous blood pressure medicine. You were then transitioned back to your home blood pressure medications. FYI you already took one dose of your hydralazine, labetolol, spironolactone, and torsemide today. Please take your Xarelto when you get home. Please stop using cocaine.  We coordinated with [employer] to submit an application/order for noninvasive ventilation. Once the order is authorized by your insurance, it will take 1-10 business days for you to get the machine.   If you feel you are having a medical emergency please call 911.   Take care! 

## 2020-12-25 NOTE — Plan of Care (Signed)

## 2020-12-28 ENCOUNTER — Telehealth (HOSPITAL_COMMUNITY): Payer: Self-pay | Admitting: Licensed Clinical Social Worker

## 2020-12-28 ENCOUNTER — Telehealth: Payer: Self-pay

## 2020-12-28 NOTE — Telephone Encounter (Signed)
Transition Care Management Unsuccessful Follow-up Telephone Call  Date of discharge and from where:  12/25/2020 from [facility]  Attempts:  1st Attempt  Reason for unsuccessful TCM follow-up call:  Left voice message

## 2020-12-28 NOTE — Telephone Encounter (Signed)
CSW following up to see what is happening with pt Bipap following recent hospital stay.   Spoke with inpatient TOC CSW who worked with pt- they report that MD had been working with [employer] to get pt Trilogy vent as pt required settings were not compatible with a Bipap.  CSW spoke with [employer] rep, Morrie Sheldon, who states they are awaiting Medicaid approval for equipment but that they have a Triology Vent in stock and can provide to pt as soon as approval is received.  Will continue to follow and assist as needed  [name], LCSW Clinical Social Worker [facility] Desk#: [phone] Cell#: [phone]

## 2020-12-28 NOTE — Telephone Encounter (Signed)
Transition Care Management Follow-up Telephone Call  Date of discharge and from where: 12/25/2020, [facility]   How have you been since you were released from the hospital? He says he is not doing too good, he can't breath. Didn't sleep last night, doesn't have the BiPAP yet.  He said he is going back to the hospital and will call EMS.

## 2020-12-29 NOTE — Telephone Encounter (Signed)
NOTED

## 2020-12-29 NOTE — Telephone Encounter (Signed)
Transition Care Management Follow-up Telephone Call  Date of discharge and from where: 12/25/2020 from [facility]  How have you been since you were released from the hospital? Pt states that he is not doing well. Pt states that he using the Home O2 but is still have difficulty breathing. Pt stated that he is not moving around a lot due to DOE. Pt states that it is set a 3L. Pt informed that [employer] is waiting on approval from Medicaid for the BiPap and the Trilogy. Pt stated understanding.   Any questions or concerns? No  Items Reviewed:  Did the pt receive and understand the discharge instructions provided? Yes   Medications obtained and verified? Yes   Other? No   Any new allergies since your discharge? No   Dietary orders reviewed? Heart Healthy  Do you have support at home? Yes   Home Care and Equipment/Supplies:  Were any new equipment or medical supplies ordered?  Yes: BiPAP What is the name of the medical supply agency? [employer] - they are awaiting approval of device and also for erx for Trilogy Were you able to get the supplies/equipment? no Do you have any questions related to the use of the equipment or supplies? No   Pt stated that he did not receive the large hand splint.   Functional Questionnaire: (I = Independent and D = Dependent) ADLs: I  Bathing/Dressing- I  Meal Prep- I  Eating- I  Maintaining continence- I  Transferring/Ambulation- I  Managing Meds- I   Follow up appointments reviewed:   PCP Hospital f/u appt confirmed? No    Specialist Hospital f/u appt confirmed? Yes  Scheduled to see [facility] NP on 01/21/2021 @ 11:30am.  Are transportation arrangements needed? No   If their condition worsens, is the pt aware to call PCP or go to the Emergency Dept.? Yes  Was the patient provided with contact information for the PCP's office or ED? Yes  Was to pt encouraged to call back with questions or concerns? Yes  .

## 2020-12-29 NOTE — Telephone Encounter (Signed)
Thanks Stefannie.  Call placed to patient. He said he is making it, just waiting for the Treligy from [place]. He said he was able to get some sleep last night. Didn't go to ED yesterday.  Reviewed with him when to return to ED and he said he is trying to wait for the BiPAP.  Also informed him that Ms Meredeth Ide, NP is working on the order for the diabetic shoes.  Scheduled him for follow up with Ms Meredeth Ide, NP on 02/01/2021.

## 2020-12-31 DIAGNOSIS — J9611 Chronic respiratory failure with hypoxia: Secondary | ICD-10-CM | POA: Diagnosis not present

## 2020-12-31 DIAGNOSIS — I5032 Chronic diastolic (congestive) heart failure: Secondary | ICD-10-CM | POA: Diagnosis not present

## 2021-01-07 ENCOUNTER — Telehealth (HOSPITAL_COMMUNITY): Payer: Self-pay | Admitting: Licensed Clinical Social Worker

## 2021-01-07 NOTE — Telephone Encounter (Signed)
CSW contacted [employer] to receive update regarding pt triology vent.  Per [employer] rep initial request for trilogy vent was denied by Medicaid.  They are following up with Medicaid to inquire as to reason for denial and see how they would need to appeal the decision.  CSW updated pt and will continue to follow and assist as needed  [name], LCSW Clinical Social Worker [facility] Desk#: [phone] Cell#: [phone]

## 2021-01-08 ENCOUNTER — Other Ambulatory Visit: Payer: Self-pay

## 2021-01-08 ENCOUNTER — Inpatient Hospital Stay (HOSPITAL_COMMUNITY)
Admission: EM | Admit: 2021-01-08 | Discharge: 2021-01-10 | DRG: 280 | Disposition: A | Discharge status: Home / Self Care | Payer: Medicaid Other | Attending: Internal Medicine | Admitting: Internal Medicine

## 2021-01-08 ENCOUNTER — Encounter (HOSPITAL_COMMUNITY): Payer: Self-pay

## 2021-01-08 ENCOUNTER — Emergency Department (HOSPITAL_COMMUNITY): Payer: Medicaid Other

## 2021-01-08 DIAGNOSIS — F1990 Other psychoactive substance use, unspecified, uncomplicated: Secondary | ICD-10-CM | POA: Diagnosis present

## 2021-01-08 DIAGNOSIS — J9622 Acute and chronic respiratory failure with hypercapnia: Secondary | ICD-10-CM | POA: Diagnosis present

## 2021-01-08 DIAGNOSIS — I110 Hypertensive heart disease with heart failure: Secondary | ICD-10-CM | POA: Diagnosis present

## 2021-01-08 DIAGNOSIS — R079 Chest pain, unspecified: Secondary | ICD-10-CM | POA: Diagnosis not present

## 2021-01-08 DIAGNOSIS — Z7901 Long term (current) use of anticoagulants: Secondary | ICD-10-CM

## 2021-01-08 DIAGNOSIS — I428 Other cardiomyopathies: Secondary | ICD-10-CM | POA: Diagnosis not present

## 2021-01-08 DIAGNOSIS — Z833 Family history of diabetes mellitus: Secondary | ICD-10-CM | POA: Diagnosis not present

## 2021-01-08 DIAGNOSIS — J449 Chronic obstructive pulmonary disease, unspecified: Secondary | ICD-10-CM | POA: Diagnosis present

## 2021-01-08 DIAGNOSIS — E662 Morbid (severe) obesity with alveolar hypoventilation: Secondary | ICD-10-CM | POA: Diagnosis not present

## 2021-01-08 DIAGNOSIS — K219 Gastro-esophageal reflux disease without esophagitis: Secondary | ICD-10-CM | POA: Diagnosis not present

## 2021-01-08 DIAGNOSIS — E119 Type 2 diabetes mellitus without complications: Secondary | ICD-10-CM | POA: Diagnosis present

## 2021-01-08 DIAGNOSIS — R0902 Hypoxemia: Secondary | ICD-10-CM | POA: Diagnosis not present

## 2021-01-08 DIAGNOSIS — Z6841 Body Mass Index (BMI) 40.0 and over, adult: Secondary | ICD-10-CM

## 2021-01-08 DIAGNOSIS — Z87891 Personal history of nicotine dependence: Secondary | ICD-10-CM

## 2021-01-08 DIAGNOSIS — J9601 Acute respiratory failure with hypoxia: Secondary | ICD-10-CM | POA: Diagnosis not present

## 2021-01-08 DIAGNOSIS — J9621 Acute and chronic respiratory failure with hypoxia: Secondary | ICD-10-CM | POA: Diagnosis not present

## 2021-01-08 DIAGNOSIS — J9602 Acute respiratory failure with hypercapnia: Secondary | ICD-10-CM | POA: Diagnosis not present

## 2021-01-08 DIAGNOSIS — Z9981 Dependence on supplemental oxygen: Secondary | ICD-10-CM

## 2021-01-08 DIAGNOSIS — Z8249 Family history of ischemic heart disease and other diseases of the circulatory system: Secondary | ICD-10-CM

## 2021-01-08 DIAGNOSIS — I5033 Acute on chronic diastolic (congestive) heart failure: Secondary | ICD-10-CM | POA: Diagnosis not present

## 2021-01-08 DIAGNOSIS — Z7984 Long term (current) use of oral hypoglycemic drugs: Secondary | ICD-10-CM

## 2021-01-08 DIAGNOSIS — G9341 Metabolic encephalopathy: Secondary | ICD-10-CM | POA: Diagnosis not present

## 2021-01-08 DIAGNOSIS — I5032 Chronic diastolic (congestive) heart failure: Secondary | ICD-10-CM | POA: Diagnosis present

## 2021-01-08 DIAGNOSIS — N179 Acute kidney failure, unspecified: Secondary | ICD-10-CM | POA: Diagnosis present

## 2021-01-08 DIAGNOSIS — R4020 Unspecified coma: Secondary | ICD-10-CM | POA: Diagnosis not present

## 2021-01-08 DIAGNOSIS — I4819 Other persistent atrial fibrillation: Principal | ICD-10-CM | POA: Diagnosis present

## 2021-01-08 DIAGNOSIS — J9600 Acute respiratory failure, unspecified whether with hypoxia or hypercapnia: Secondary | ICD-10-CM | POA: Diagnosis present

## 2021-01-08 DIAGNOSIS — F1910 Other psychoactive substance abuse, uncomplicated: Secondary | ICD-10-CM | POA: Diagnosis present

## 2021-01-08 DIAGNOSIS — I499 Cardiac arrhythmia, unspecified: Secondary | ICD-10-CM | POA: Diagnosis not present

## 2021-01-08 DIAGNOSIS — F1410 Cocaine abuse, uncomplicated: Secondary | ICD-10-CM | POA: Diagnosis present

## 2021-01-08 DIAGNOSIS — Z86711 Personal history of pulmonary embolism: Secondary | ICD-10-CM | POA: Diagnosis not present

## 2021-01-08 DIAGNOSIS — R0602 Shortness of breath: Secondary | ICD-10-CM | POA: Diagnosis not present

## 2021-01-08 DIAGNOSIS — Z9911 Dependence on respirator [ventilator] status: Secondary | ICD-10-CM | POA: Diagnosis not present

## 2021-01-08 DIAGNOSIS — J80 Acute respiratory distress syndrome: Secondary | ICD-10-CM | POA: Diagnosis not present

## 2021-01-08 DIAGNOSIS — I517 Cardiomegaly: Secondary | ICD-10-CM | POA: Diagnosis not present

## 2021-01-08 DIAGNOSIS — I5030 Unspecified diastolic (congestive) heart failure: Secondary | ICD-10-CM | POA: Diagnosis not present

## 2021-01-08 DIAGNOSIS — J811 Chronic pulmonary edema: Secondary | ICD-10-CM | POA: Diagnosis not present

## 2021-01-08 DIAGNOSIS — I21A1 Myocardial infarction type 2: Secondary | ICD-10-CM | POA: Diagnosis not present

## 2021-01-08 DIAGNOSIS — I10 Essential (primary) hypertension: Secondary | ICD-10-CM | POA: Diagnosis not present

## 2021-01-08 DIAGNOSIS — I4891 Unspecified atrial fibrillation: Secondary | ICD-10-CM | POA: Diagnosis not present

## 2021-01-08 DIAGNOSIS — Z8616 Personal history of COVID-19: Secondary | ICD-10-CM

## 2021-01-08 DIAGNOSIS — Z79899 Other long term (current) drug therapy: Secondary | ICD-10-CM | POA: Diagnosis not present

## 2021-01-08 DIAGNOSIS — I482 Chronic atrial fibrillation, unspecified: Secondary | ICD-10-CM | POA: Diagnosis not present

## 2021-01-08 DIAGNOSIS — I214 Non-ST elevation (NSTEMI) myocardial infarction: Secondary | ICD-10-CM | POA: Diagnosis not present

## 2021-01-08 DIAGNOSIS — Z20822 Contact with and (suspected) exposure to covid-19: Secondary | ICD-10-CM | POA: Diagnosis present

## 2021-01-08 DIAGNOSIS — Z9114 Patient's other noncompliance with medication regimen: Secondary | ICD-10-CM

## 2021-01-08 DIAGNOSIS — J810 Acute pulmonary edema: Secondary | ICD-10-CM | POA: Diagnosis not present

## 2021-01-08 DIAGNOSIS — R0989 Other specified symptoms and signs involving the circulatory and respiratory systems: Secondary | ICD-10-CM | POA: Diagnosis not present

## 2021-01-08 DIAGNOSIS — G4733 Obstructive sleep apnea (adult) (pediatric): Secondary | ICD-10-CM | POA: Diagnosis not present

## 2021-01-08 DIAGNOSIS — Z9119 Patient's noncompliance with other medical treatment and regimen: Secondary | ICD-10-CM

## 2021-01-08 LAB — I-STAT VENOUS BLOOD GAS, ED
Acid-Base Excess: 9 mmol/L — ABNORMAL HIGH (ref 0.0–2.0)
Bicarbonate: 35.1 mmol/L — ABNORMAL HIGH (ref 20.0–28.0)
Calcium, Ion: 1.14 mmol/L — ABNORMAL LOW (ref 1.15–1.40)
HCT: 44 % (ref 39.0–52.0)
Hemoglobin: 15 g/dL (ref 13.0–17.0)
O2 Saturation: 93 %
Potassium: 3.7 mmol/L (ref 3.5–5.1)
Sodium: 140 mmol/L (ref 135–145)
TCO2: 37 mmol/L — ABNORMAL HIGH (ref 22–32)
pCO2, Ven: 54.4 mmHg (ref 44.0–60.0)
pH, Ven: 7.418 (ref 7.250–7.430)
pO2, Ven: 69 mmHg — ABNORMAL HIGH (ref 32.0–45.0)

## 2021-01-08 LAB — CBC WITH DIFFERENTIAL/PLATELET
Abs Immature Granulocytes: 0.02 10*3/uL (ref 0.00–0.07)
Basophils Absolute: 0 10*3/uL (ref 0.0–0.1)
Basophils Relative: 1 %
Eosinophils Absolute: 0.4 10*3/uL (ref 0.0–0.5)
Eosinophils Relative: 6 %
HCT: 42.2 % (ref 39.0–52.0)
Hemoglobin: 12.9 g/dL — ABNORMAL LOW (ref 13.0–17.0)
Immature Granulocytes: 0 %
Lymphocytes Relative: 27 %
Lymphs Abs: 1.6 10*3/uL (ref 0.7–4.0)
MCH: 25.1 pg — ABNORMAL LOW (ref 26.0–34.0)
MCHC: 30.6 g/dL (ref 30.0–36.0)
MCV: 82.3 fL (ref 80.0–100.0)
Monocytes Absolute: 0.5 10*3/uL (ref 0.1–1.0)
Monocytes Relative: 8 %
Neutro Abs: 3.4 10*3/uL (ref 1.7–7.7)
Neutrophils Relative %: 58 %
Platelets: 398 10*3/uL (ref 150–400)
RBC: 5.13 MIL/uL (ref 4.22–5.81)
RDW: 15.1 % (ref 11.5–15.5)
WBC: 5.8 10*3/uL (ref 4.0–10.5)
nRBC: 0 % (ref 0.0–0.2)

## 2021-01-08 LAB — I-STAT CHEM 8, ED
BUN: 17 mg/dL (ref 6–20)
Calcium, Ion: 1.16 mmol/L (ref 1.15–1.40)
Chloride: 96 mmol/L — ABNORMAL LOW (ref 98–111)
Creatinine, Ser: 1.3 mg/dL — ABNORMAL HIGH (ref 0.61–1.24)
Glucose, Bld: 133 mg/dL — ABNORMAL HIGH (ref 70–99)
HCT: 44 % (ref 39.0–52.0)
Hemoglobin: 15 g/dL (ref 13.0–17.0)
Potassium: 3.7 mmol/L (ref 3.5–5.1)
Sodium: 139 mmol/L (ref 135–145)
TCO2: 32 mmol/L (ref 22–32)

## 2021-01-08 LAB — I-STAT ARTERIAL BLOOD GAS, ED
Acid-Base Excess: 8 mmol/L — ABNORMAL HIGH (ref 0.0–2.0)
Bicarbonate: 35.5 mmol/L — ABNORMAL HIGH (ref 20.0–28.0)
Calcium, Ion: 1.22 mmol/L (ref 1.15–1.40)
HCT: 43 % (ref 39.0–52.0)
Hemoglobin: 14.6 g/dL (ref 13.0–17.0)
O2 Saturation: 100 %
Patient temperature: 98.6
Potassium: 3.7 mmol/L (ref 3.5–5.1)
Sodium: 140 mmol/L (ref 135–145)
TCO2: 37 mmol/L — ABNORMAL HIGH (ref 22–32)
pCO2 arterial: 58.4 mmHg — ABNORMAL HIGH (ref 32.0–48.0)
pH, Arterial: 7.392 (ref 7.350–7.450)
pO2, Arterial: 456 mmHg — ABNORMAL HIGH (ref 83.0–108.0)

## 2021-01-08 LAB — COMPREHENSIVE METABOLIC PANEL
ALT: 17 U/L (ref 0–44)
AST: 23 U/L (ref 15–41)
Albumin: 3.4 g/dL — ABNORMAL LOW (ref 3.5–5.0)
Alkaline Phosphatase: 43 U/L (ref 38–126)
Anion gap: 8 (ref 5–15)
BUN: 15 mg/dL (ref 6–20)
CO2: 31 mmol/L (ref 22–32)
Calcium: 9 mg/dL (ref 8.9–10.3)
Chloride: 100 mmol/L (ref 98–111)
Creatinine, Ser: 1.32 mg/dL — ABNORMAL HIGH (ref 0.61–1.24)
GFR, Estimated: >60 mL/min (ref >60)
Glucose, Bld: 127 mg/dL — ABNORMAL HIGH (ref 70–99)
Potassium: 3.8 mmol/L (ref 3.5–5.1)
Sodium: 139 mmol/L (ref 135–145)
Total Bilirubin: 0.2 mg/dL — ABNORMAL LOW (ref 0.3–1.2)
Total Protein: 6.9 g/dL (ref 6.5–8.1)

## 2021-01-08 LAB — RESP PANEL BY RT-PCR (FLU A&B, COVID) ARPGX2
Influenza A by PCR: NEGATIVE
Influenza B by PCR: NEGATIVE
SARS Coronavirus 2 by RT PCR: NEGATIVE

## 2021-01-08 LAB — RAPID URINE DRUG SCREEN, HOSP PERFORMED
Amphetamines: NOT DETECTED
Barbiturates: NOT DETECTED
Benzodiazepines: NOT DETECTED
Cocaine: POSITIVE — AB
Opiates: NOT DETECTED
Tetrahydrocannabinol: NOT DETECTED

## 2021-01-08 LAB — GLUCOSE, CAPILLARY: Glucose-Capillary: 159 mg/dL — ABNORMAL HIGH (ref 70–99)

## 2021-01-08 LAB — BRAIN NATRIURETIC PEPTIDE: B Natriuretic Peptide: 18.7 pg/mL (ref 0.0–100.0)

## 2021-01-08 LAB — TROPONIN I (HIGH SENSITIVITY)
Troponin I (High Sensitivity): 25 ng/L — ABNORMAL HIGH (ref <18)
Troponin I (High Sensitivity): 32 ng/L — ABNORMAL HIGH (ref <18)
Troponin I (High Sensitivity): 37 ng/L — ABNORMAL HIGH (ref <18)
Troponin I (High Sensitivity): 34 ng/L — ABNORMAL HIGH (ref <18)

## 2021-01-08 MED ORDER — NALOXONE HCL 0.4 MG/ML IJ SOLN
0.4000 mg | Freq: Once | INTRAMUSCULAR | Status: AC
  Administered 2021-01-08: 0.4 mg via INTRAVENOUS
  Filled 2021-01-08: qty 1

## 2021-01-08 MED ORDER — NITROGLYCERIN IN D5W 200-5 MCG/ML-% IV SOLN: 0.0000 ug/min | INTRAVENOUS | Status: DC

## 2021-01-08 MED ORDER — ACETAMINOPHEN 325 MG PO TABS: 650.0000 mg | ORAL_TABLET | ORAL | Status: DC | PRN

## 2021-01-08 MED ORDER — NITROGLYCERIN IN D5W 200-5 MCG/ML-% IV SOLN
INTRAVENOUS | Status: AC
  Filled 2021-01-08: qty 250

## 2021-01-08 MED ORDER — FUROSEMIDE 10 MG/ML IJ SOLN
40.0000 mg | Freq: Once | INTRAMUSCULAR | Status: AC
  Administered 2021-01-08: 40 mg via INTRAVENOUS
  Filled 2021-01-08: qty 4

## 2021-01-08 MED ORDER — DILTIAZEM HCL-DEXTROSE 125-5 MG/125ML-% IV SOLN (PREMIX)
5.0000 mg/h | INTRAVENOUS | Status: DC
  Administered 2021-01-08: 5 mg/h via INTRAVENOUS
  Filled 2021-01-08 (×2): qty 125

## 2021-01-08 MED ORDER — FUROSEMIDE 10 MG/ML IJ SOLN
60.0000 mg | Freq: Once | INTRAMUSCULAR | Status: AC
  Administered 2021-01-08: 60 mg via INTRAVENOUS
  Filled 2021-01-08: qty 6

## 2021-01-08 MED ORDER — DILTIAZEM LOAD VIA INFUSION
10.0000 mg | Freq: Once | INTRAVENOUS | Status: AC
  Administered 2021-01-08: 10 mg via INTRAVENOUS
  Filled 2021-01-08: qty 10

## 2021-01-08 MED ORDER — RIVAROXABAN 20 MG PO TABS
20.0000 mg | ORAL_TABLET | Freq: Every day | ORAL | Status: DC
  Administered 2021-01-09: 20 mg via ORAL
  Filled 2021-01-08 (×2): qty 1

## 2021-01-08 NOTE — ED Triage Notes (Signed)
Pt arrived via [facility] from home for c/o SOB and chest pain. Per EMS when they arrived pt was in respiratory distress, in tripod position using accessory muscles and crackles in all fields of lungs. Per EMS they put pt on bipap and it seemed to improve breathing at first then pt became lethargic and had a LOC 5 mins prior to arriving to hosp. Pt arrived unresponsive and being bagged with BVM. Pt is in A-fib w/RVR and tachypneic. Pt is confused and uncooperative with procedures.

## 2021-01-08 NOTE — ED Provider Notes (Signed)
[facility] EMERGENCY DEPARTMENT Provider Note   CSN: [id number] Arrival date & time: 01/08/21  1008     History Chief Complaint  Patient presents with  . Respiratory Distress    Benjamin [name] is a 55 y.o. male.  Level 5 caveat due to respiratory distress.  Per EMS patient called out due to shortness of breath.  When they got there patient was having signs of respiratory distress.  He was put on CPAP.  Patient was in A. fib with RVR.  They gave him a dose of nitroglycerin before starting BiPAP.  Has a history of heart failure.  Overall he is been pretty somnolent in route with EMS.  However upon arrival here he appears to actually wake up on his own and ask for his BiPAP.  He does appear somnolent but he is following commands and is arousable.  Unable to get much history otherwise.  No signs of visible trauma.  Patient is on blood thinner.  The history is provided by the EMS personnel.  Shortness of Breath Severity:  Severe Risk factors: hx of PE/DVT        Past Medical History:  Diagnosis Date  . A-fib ([place])   . Anemia 05/29/2019  . CHF (congestive heart failure) ([place])   . Chronic respiratory failure ([place])   . Diabetes mellitus without complication ([place])   . Dyspnea   . Elevated troponin 02/06/2019  . Hypertension   . Obesity   . Pulmonary embolism ([place])   . Rectal bleeding 05/29/2019  . Sleep apnea     Patient Active Problem List   Diagnosis Date Noted  . Acute on chronic respiratory failure ([place]) 12/24/2020  . Obstructive sleep apnea treated with BiPAP 12/24/2020  . Polysubstance abuse ([place]) 08/20/2020  . Alcohol abuse 08/20/2020  . Atrial fibrillation with rapid ventricular response ([place]) 08/19/2020  . Obesity hypoventilation syndrome ([place]) 06/12/2020  . Healthcare maintenance 06/12/2020  . Acute on chronic diastolic heart failure ([place]) 02/24/2020  . Diabetic neuropathy ([place]) 11/20/2019  . Coagulation disorder ([place]) 11/20/2019  . Acute  on chronic diastolic CHF (congestive heart failure), NYHA class 3 ([place]) 07/30/2019  . Diabetes mellitus without complication ([place]) 69/45/0388  . Hypertensive emergency   . BRBPR (bright red blood per rectum) 06/12/2019  . Hypertension   . CHF (congestive heart failure) ([place])   . Sleep apnea   . Chronic respiratory failure ([place])   . Hypokalemia 05/29/2019  . GI bleed 05/29/2019  . Anemia 05/28/2019  . Acute on chronic diastolic CHF (congestive heart failure) ([place]) 04/11/2019  . Iron deficiency anemia 03/11/2019  . OSA on CPAP 03/11/2019  . HLD (hyperlipidemia) 03/11/2019  . Elevated troponin 03/11/2019  . GERD (gastroesophageal reflux disease) 03/11/2019  . Rectal bleeding 02/20/2019  . Dyspnea 02/06/2019  . COVID-19 virus infection 02/06/2019  . Bilateral lower extremity edema   . Morbid obesity with BMI of 50.0-59.9, adult ([place])   . Chronic diastolic CHF (congestive heart failure) ([place])   . PAF (paroxysmal atrial fibrillation) ([place])   . PE (pulmonary thromboembolism) ([place]) 01/21/2019  . Hypertensive urgency 01/21/2019  . Diabetes mellitus type 2 in obese ([place]) 01/21/2019    Past Surgical History:  Procedure Laterality Date  . CARDIOVERSION N/A 08/21/2020   Procedure: CARDIOVERSION;  Surgeon: Larey Dresser, MD;  Location: [facility] ENDOSCOPY;  Service: Cardiovascular;  Laterality: N/A;  . COLONOSCOPY WITH PROPOFOL Left 02/25/2019   Procedure: COLONOSCOPY WITH PROPOFOL;  Surgeon: Arta Silence, MD;  Location: [facility];  Service: Endoscopy;  Laterality: Left;  . ESOPHAGOGASTRODUODENOSCOPY (EGD) WITH PROPOFOL Left 02/25/2019   Procedure: ESOPHAGOGASTRODUODENOSCOPY (EGD) WITH PROPOFOL;  Surgeon: Arta Silence, MD;  Location: [facility] ENDOSCOPY;  Service: Endoscopy;  Laterality: Left;  . ESOPHAGOGASTRODUODENOSCOPY (EGD) WITH PROPOFOL N/A 06/12/2019   Procedure: ESOPHAGOGASTRODUODENOSCOPY (EGD) WITH PROPOFOL;  Surgeon: Clarene Essex, MD;  Location: [name];  Service: Gastroenterology;   Laterality: N/A;  . FLEXIBLE SIGMOIDOSCOPY N/A 06/12/2019   Procedure: FLEXIBLE SIGMOIDOSCOPY;  Surgeon: Clarene Essex, MD;  Location: [facility];  Service: Gastroenterology;  Laterality: N/A;  . HEMORRHOID BANDING  05/2019  . NO PAST SURGERIES    . RIGHT HEART CATH N/A 04/17/2019   Procedure: RIGHT HEART CATH;  Surgeon: Larey Dresser, MD;  Location: [place] [facility] CV LAB;  Service: Cardiovascular;  Laterality: N/A;  . TEE WITHOUT CARDIOVERSION N/A 08/21/2020   Procedure: TRANSESOPHAGEAL ECHOCARDIOGRAM (TEE);  Surgeon: Larey Dresser, MD;  Location: [facility] ENDOSCOPY;  Service: Cardiovascular;  Laterality: N/A;       Family History  Problem Relation Age of Onset  . Hypertension Mother   . Diabetes Mother     Social History   Tobacco Use  . Smoking status: Former Research scientist (life sciences)  . Smokeless tobacco: Never Used  . Tobacco comment: smoked weed in the past  Vaping Use  . Vaping Use: Never used  Substance Use Topics  . Alcohol use: Not Currently  . Drug use: Yes    Types: Marijuana, Cocaine    Home Medications Prior to Admission medications   Medication Sig Start Date End Date Taking? Authorizing Provider  atorvastatin (LIPITOR) 40 MG tablet Take 1 tablet (40 mg total) by mouth at bedtime. 09/22/20 12/21/20  [name], NP  Blood Glucose Monitoring Suppl (TRUE METRIX METER) w/Device KIT Use as instructed. Check blood glucose level by fingerstick twice per day. 03/18/19   [name], NP  Blood Pressure Monitor DEVI Please provide patient with insurance approved blood pressure device with L-XL cuff. BMI 55 Patient taking differently: 1 each by Other route See admin instructions. Please provide patient with insurance approved blood pressure device with L-XL cuff. BMI 55 10/04/19   [name], NP  carboxymethylcellulose (REFRESH PLUS) 0.5 % SOLN Place 1 drop into both eyes daily.    [provider]  diltiazem (CARDIZEM CD) 240 MG 24 hr capsule Take 1 capsule (240 mg total) by  mouth daily. 08/25/20   Nita Sells, MD  docusate sodium (COLACE) 100 MG capsule Take 100 mg by mouth 2 (two) times daily. 12/14/20   [provider]  doxazosin (CARDURA) 2 MG tablet Take 1 tablet (2 mg total) by mouth daily. 07/02/20 07/02/21  Lyda Jester M, PA-C  ferrous sulfate 325 (65 FE) MG tablet Take 1 tablet (325 mg total) by mouth daily. 12/23/20 03/23/21  [name], NP  glucose blood (TRUE METRIX BLOOD GLUCOSE TEST) test strip Use as instructed. Check blood glucose level by fingerstick twice per day. 09/22/20   [name], NP  hydrALAZINE (APRESOLINE) 50 MG tablet Take 1 tablet (50 mg total) by mouth 3 (three) times daily. 09/17/20   Larey Dresser, MD  labetalol (NORMODYNE) 200 MG tablet TAKE ONE TABLET BY MOUTH TWICE DAILY ( NOON +BEDTIME) Patient taking differently: Take 200 mg by mouth 2 (two) times daily. 12/14/20   Larey Dresser, MD  losartan (COZAAR) 100 MG tablet Take 1 tablet (100 mg total) by mouth daily. Patient not taking: Reported on 12/25/2020 05/04/20   Darrick Grinder  D, NP  Lumateperone Tosylate (CAPLYTA) 42 MG CAPS Take 42 mg by mouth at bedtime. Patient not taking: Reported on 12/25/2020 10/09/20   Bensimhon, Shaune Pascal, MD  magnesium oxide (MAG-OX) 400 (241.3 Mg) MG tablet TAKE 2 TABLETS BY MOUTH EVERY MORNING AND 1 TABLET EVERY EVENING NOON+PM+BEDTIME) Patient taking differently: Take 400 mg by mouth 3 (three) times daily. 11/04/20   Larey Dresser, MD  metFORMIN (GLUCOPHAGE) 500 MG tablet TAKE ONE TABLET BY MOUTH TWICE DAILY WITH A MEAL (NOON+BEDTIME) Patient taking differently: Take 500 mg by mouth 2 (two) times daily with a meal. 12/14/20   [name], NP  Misc. Devices MISC Requires O2 @ 3L/min continuously via nasal canula and home fill system,. Patient taking differently: 1 each by Other route See admin instructions. Requires O2 @ 3L/min continuously via nasal canula and home fill system,. 07/03/19   [name], NP  Misc. Devices  MISC Please provide Mr. Cortright with insurance approved portable O2 concentrator ICD 10 J96.11 Z99.81 Patient taking differently: 1 each by Other route See admin instructions. Please provide Mr. Klingensmith with insurance approved portable O2 concentrator ICD 10 J96.11 Z99.81 08/27/19   [name], NP  Misc. Devices MISC Please provide patient with insurance approved LARGE right hand splint ICD 10 G56.01 11/16/20   [name], NP  omeprazole (PRILOSEC) 20 MG capsule TAKE 1 CAPSULE BY MOUTH 2 (TWO) TIMES DAILY BEFORE A MEAL. TAKE FIRST CAPSULE 30 MIN PRIOR TO EATING OR TAKING OTHER MEDICATIONS. (AM+BEDTIME) Patient taking differently: Take 20 mg by mouth 2 (two) times daily before a meal. 08/24/20   [name], NP  potassium chloride (KLOR-CON) 10 MEQ tablet Take 8 tablets (80 mEq total) by mouth daily. 10/20/20   [place], Maricela Bo, FNP  PROVENTIL HFA 108 (90 Base) MCG/ACT inhaler INHALE 2 PUFFS BY MOUTH EVERY 6 (SIX) HOURS AS NEEDED FOR WHEEZING OR SHORTNESS OF BREATH. Patient taking differently: Inhale 2 puffs into the lungs every 6 (six) hours as needed for wheezing or shortness of breath. 09/22/20   [name], NP  spironolactone (ALDACTONE) 50 MG tablet TAKE 1 TABLET (50 MG TOTAL) BY MOUTH DAILY (NOON) Patient taking differently: Take 50 mg by mouth daily. 08/25/20   Bensimhon, Shaune Pascal, MD  torsemide (DEMADEX) 20 MG tablet Take 40m by mouth twice daily Patient taking differently: Take 60 mg by mouth 2 (two) times daily. 09/17/20   MLarey Dresser MD  TRUEplus Lancets 28G MISC Use as instructed. Check blood glucose level by fingerstick twice per day. 09/22/20   FGildardo Pounds NP  XARELTO 20 MG TABS tablet TAKE 1 TABLET BY MOUTH DAILY WITH SUPPER (BEDTIME) Patient taking differently: Take 20 mg by mouth daily with supper. 07/14/20   SConsuelo Pandy PA-C    Allergies    Patient has no known allergies.  Review of Systems   Review of Systems  Unable to perform ROS:  Acuity of condition  Respiratory: Positive for shortness of breath.     Physical Exam Updated Vital Signs BP 122/74   Pulse (!) 142   Temp (!) 97.5 F (36.4 C) (Axillary)   Resp 11   Ht 5' 11" (1.803 m)   Wt (!) 160.5 kg   SpO2 100%   BMI 49.35 kg/m   Physical Exam Vitals and nursing note reviewed.  Constitutional:      General: He is not in acute distress.    Appearance: He is well-developed. He is ill-appearing.  HENT:     Head: Normocephalic and atraumatic.     Nose: Nose normal.     Mouth/Throat:     Mouth: Mucous membranes are moist.  Eyes:     Extraocular Movements: Extraocular movements intact.     Conjunctiva/sclera: Conjunctivae normal.     Pupils: Pupils are equal, round, and reactive to light.  Cardiovascular:     Rate and Rhythm: Tachycardia present. Rhythm irregular.     Heart sounds: No murmur heard.   Pulmonary:     Effort: Respiratory distress present.     Breath sounds: Rales present.  Abdominal:     Palpations: Abdomen is soft.     Tenderness: There is no abdominal tenderness.  Musculoskeletal:     Cervical back: Neck supple.     Right lower leg: Edema (1+) present.     Left lower leg: Edema (1+) present.  Skin:    General: Skin is warm and dry.  Neurological:     General: No focal deficit present.     Mental Status: He is alert.     Comments: Patient appears to move all extremities, he opens eyes and talks on his own's and follows commands, he is asking for his BiPAP  Psychiatric:        Mood and Affect: Mood normal.     ED Results / Procedures / Treatments   Labs (all labs ordered are listed, but only abnormal results are displayed) Labs Reviewed  CBC WITH DIFFERENTIAL/PLATELET - Abnormal; Notable for the following components:      Result Value   Hemoglobin 12.9 (*)    MCH 25.1 (*)    All other components within normal limits  COMPREHENSIVE METABOLIC PANEL - Abnormal; Notable for the following components:   Glucose, Bld 127 (*)     Creatinine, Ser 1.32 (*)    Albumin 3.4 (*)    Total Bilirubin 0.2 (*)    All other components within normal limits  I-STAT CHEM 8, ED - Abnormal; Notable for the following components:   Chloride 96 (*)    Creatinine, Ser 1.30 (*)    Glucose, Bld 133 (*)    All other components within normal limits  I-STAT VENOUS BLOOD GAS, ED - Abnormal; Notable for the following components:   pO2, Ven 69.0 (*)    Bicarbonate 35.1 (*)    TCO2 37 (*)    Acid-Base Excess 9.0 (*)    Calcium, Ion 1.14 (*)    All other components within normal limits  I-STAT ARTERIAL BLOOD GAS, ED - Abnormal; Notable for the following components:   pCO2 arterial 58.4 (*)    pO2, Arterial 456 (*)    Bicarbonate 35.5 (*)    TCO2 37 (*)    Acid-Base Excess 8.0 (*)    All other components within normal limits  TROPONIN I (HIGH SENSITIVITY) - Abnormal; Notable for the following components:   Troponin I (High Sensitivity) 25 (*)    All other components within normal limits  RESP PANEL BY RT-PCR (FLU A&B, COVID) ARPGX2  RAPID URINE DRUG SCREEN, HOSP PERFORMED  BRAIN NATRIURETIC PEPTIDE  TROPONIN I (HIGH SENSITIVITY)    EKG EKG Interpretation  Date/Time:  Friday January 08 2021 10:20:07 EDT Ventricular Rate:  129 PR Interval:    QRS Duration: 95 QT Interval:  321 QTC Calculation: 471 R Axis:   68 Text Interpretation: Atrial fibrillation Borderline repolarization abnormality Confirmed by Lennice Sites ([id number]) on 01/08/2021 10:56:43 AM   Radiology DG Chest Portable 1  View  Result Date: 01/08/2021 CLINICAL DATA:  Shortness of breath, chest pain, respiratory distress, crackles in all lung fields EXAM: PORTABLE CHEST 1 VIEW COMPARISON:  Portable exam 1035 hours compared to 12/24/2020 FINDINGS: Enlargement of cardiac silhouette. Mediastinal contours and pulmonary vascularity normal. Peribronchial thickening and accentuation of perihilar markings favoring pulmonary edema, improved. No pleural effusion or pneumothorax.  Bones demineralized. IMPRESSION: Enlargement of cardiac silhouette with improved pulmonary edema. Electronically Signed   By: Lavonia Dana M.D.   On: 01/08/2021 10:58    Procedures .Critical Care Performed by: Lennice Sites, DO Authorized by: Lennice Sites, DO   Critical care provider statement:    Critical care time (minutes):  45   Critical care was necessary to treat or prevent imminent or life-threatening deterioration of the following conditions:  Respiratory failure   Critical care was time spent personally by me on the following activities:  Blood draw for specimens, development of treatment plan with patient or surrogate, discussions with primary provider, evaluation of patient's response to treatment, examination of patient, obtaining history from patient or surrogate, ordering and performing treatments and interventions, ordering and review of laboratory studies, ordering and review of radiographic studies, pulse oximetry, re-evaluation of patient's condition and review of old charts   I assumed direction of critical care for this patient from another provider in my specialty: no     Care discussed with: admitting provider       Medications Ordered in ED Medications  diltiazem (CARDIZEM) 1 mg/mL load via infusion 10 mg (has no administration in time range)    And  diltiazem (CARDIZEM) 125 mg in dextrose 5% 125 mL (1 mg/mL) infusion (has no administration in time range)  naloxone [facility]) injection 0.4 mg (0.4 mg Intravenous Given 01/08/21 1038)  furosemide (LASIX) injection 40 mg (40 mg Intravenous Given 01/08/21 1040)    ED Course  I have reviewed the triage vital signs and the nursing notes.  Pertinent labs & imaging results that were available during my care of the patient were reviewed by me and considered in my medical decision making (see chart for details).    MDM Rules/Calculators/A&P                          Benjamin [name] is a 55 year old male with history of  hypertension, diabetes, heart failure, polysubstance abuse who presents the ED in respiratory distress.  Patient arrives with A. fib with RVR, lethargic, crackles/rales, some peripheral edema.  EMS states patient called out for respiratory distress.  When they got out there he was in distress and they placed him on BiPAP.  He has been maintaining his oxygenation on that.  He has been lethargic.  Upon arrival here patient is somnolent but does arouse and follow commands.  Does appear to be volume overloaded.  Was initially hypertensive in the 170s with EMS but they gave him a dose of oral nitroglycerin and now his blood pressure is in the low 100s.  We will hold on any further nitroglycerin at this time.  We will give a dose of Lasix.  Overall suspect heart failure versus sleep apnea/hypercarbia causing issues today.  We will check a blood gas.  Mentation appears to be improving slowly on BiPAP and will hold off on intubation at this time.  Will reevaluate.  EKG shows A. fib with RVR.  No evidence of ischemia.  Will check basic labs including BNP and troponin.  Will check urine drug  screen.  12:38 PM patient reevaluated shortly after arriving he is more awake and talking.  He admits to cocaine use.  He states that he does not have his breathing machine yet.  We will continue to monitor.  Patient continues to improve on BiPAP.  Lab work otherwise unremarkable.  Troponin mildly elevated.  Chest x-ray with slightly improved pulmonary edema.  Overall suspect respiratory failure in the setting of cocaine abuse, noncompliance with CPAP machine.  We will start IV diltiazem bolus and infusion for A. fib with RVR.  To admit to medicine for further care.  This chart was dictated using voice recognition software.  Despite best efforts to proofread,  errors can occur which can change the documentation meaning.    Final Clinical Impression(s) / ED Diagnoses Final diagnoses:  Acute respiratory failure with hypoxia and  hypercapnia (HCC)  Cocaine abuse (HCC)  Atrial fibrillation with RVR [facility])    Rx / DC Orders ED Discharge Orders    None       Lennice Sites, DO 01/08/21 1238
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
## 2021-01-08 NOTE — ED Notes (Signed)
Pt unable to stay awake long enough at this time to sign MSE

## 2021-01-08 NOTE — Consult Note (Signed)
Cardiology Consultation:   Patient ID: Benjamin [name] MRN: [id number]; DOB: August 01, 1966  Admit date: 01/08/2021 Date of Consult: 01/08/2021  PCP:  Claiborne Rigg, NP   [facility]  Cardiologist:  [phone]    Patient Profile:   Benjamin [name] is a 55 y.o. male with a hx of diastolic CHF who is being seen today for the evaluation of Benjamin [name]  at the request of afib forRVR and edema   History of Present Illness:   Benjamin [name] is a 55 yo with hx of Diastolic CHF, COPD (on 4L O2), PE(April 2020) , Atrial fibrillation (s/p cardioversion x1 in 12/21), HTN, OSA, substance abuse and Type II DM   The pt was admitted in December 2021 Myovue showed no ischemia Echo showed normal LVEF 60 to 65% with normal wall motion.  Found to be in afib  Underwent TEE/cardioversion    The pt was last seen in cardiology by Benjamin Alstrom, DNP in [facility] in Jan 2021  The pt was admitted In APri 7, 2022 with CP, SOB and hypertensiveurgency  He had not been using BiPAP   CXR showed pulmonary edema  Treated with BiPAP and symptoms improved.  Placed on NTG IV for BP  Improved  Per record was euvolemic at the time  Trop minimally elevated (28, 24)  Note he was + for cocaine atthat admit.  Pt discharged on 12/25/20.  Today EMS was called for SOB   On arrival he was in resp distress  Placed on BPAPA   Found to be in afib with RVR.   Given NTG and Bipap   Last cocaine use was yesterday [place] Spaniel he took it to stop his hurting   The pt says he has been SOB for awhile   Has not had BiPAP (cant get right size)   Progressive SOB and chst tightness   Used cocaine because he thought it would help   Called EMS  IN ED pt on BiPAP   When removed says his breathign and chst tightness are better  (did get IV lasix as well)   He says he can feel his heart fluttering occasionally  Not sure when started    Says he may have missed a few doses of anticoagulant  Not sure   Says he has been taking meds for the most part but  not today   No F/C  No cough    Pt had a myovue in Dec 2021.   Fixed inferior defect consistent with soft tissue attenuation  No ischemia  Past Medical History:  Diagnosis Date  . A-fib (HCC)   . Anemia 05/29/2019  . CHF (congestive heart failure) (HCC)   . Chronic respiratory failure (HCC)   . Diabetes mellitus without complication (HCC)   . Dyspnea   . Elevated troponin 02/06/2019  . Hypertension   . Obesity   . Pulmonary embolism (HCC)   . Rectal bleeding 05/29/2019  . Sleep apnea     Past Surgical History:  Procedure Laterality Date  . CARDIOVERSION N/A 08/21/2020   Procedure: CARDIOVERSION;  Surgeon: Laurey Morale, MD;  Location[facility] ENDOSCOPY;  Service: Cardiovascular;  Laterality: N/A;  . COLONOSCOPY WITH PROPOFOL Left 02/25/2019   Procedure: COLONOSCOPY WITH PROPOFOL;  Surgeon: [name], MD;  Location[facility] ENDOSCOPY;  Service: Endoscopy;  Laterality: Left;  . ESOPHAGOGASTRODUODENOSCOPY (EGD) WITH PROPOFOL Left 02/25/2019   Procedure: ESOPHAGOGASTRODUODENOSCOPY (EGD) WITH PROPOFOL;  Surgeon: [name], MD;  Location: [facility] ENDOSCOPY;  Service:  Endoscopy;  Laterality: Left;  . ESOPHAGOGASTRODUODENOSCOPY (EGD) WITH PROPOFOL N/A 06/12/2019   Procedure: ESOPHAGOGASTRODUODENOSCOPY (EGD) WITH PROPOFOL;  Surgeon: Vida Rigger, MD;  Location: [facility] ENDOSCOPY;  Service: Gastroenterology;  Laterality: N/A;  . FLEXIBLE SIGMOIDOSCOPY N/A 06/12/2019   Procedure: FLEXIBLE SIGMOIDOSCOPY;  Surgeon: Vida Rigger, MD;  Location: [facility] ENDOSCOPY;  Service: Gastroenterology;  Laterality: N/A;  . HEMORRHOID BANDING  05/2019  . NO PAST SURGERIES    . RIGHT HEART CATH N/A 04/17/2019   Procedure: RIGHT HEART CATH;  Surgeon: Laurey Morale, MD;  Location: [facility] INVASIVE CV LAB;  Service: Cardiovascular;  Laterality: N/A;  . TEE WITHOUT CARDIOVERSION N/A 08/21/2020   Procedure: TRANSESOPHAGEAL ECHOCARDIOGRAM (TEE);  Surgeon: Laurey Morale, MD;  Location: [facility] ENDOSCOPY;  Service: Cardiovascular;   Laterality: N/A;       Inpatient Medications: Scheduled Meds: . furosemide  60 mg Intravenous Once  . rivaroxaban  20 mg Oral Q supper   Continuous Infusions: . diltiazem (CARDIZEM) infusion 10 mg/hr (01/08/21 1456)   PRN Meds: acetaminophen  Allergies:   No Known Allergies  Social History:   Social History   Socioeconomic History  . Marital status: Single    Spouse name: Not on file  . Number of children: Not on file  . Years of education: Not on file  . Highest education level: Not on file  Occupational History  . Not on file  Tobacco Use  . Smoking status: Former Games developer  . Smokeless tobacco: Never Used  . Tobacco comment: smoked weed in the past  Vaping Use  . Vaping Use: Never used  Substance and Sexual Activity  . Alcohol use: Not Currently  . Drug use: Yes    Types: Marijuana, Cocaine  . Sexual activity: Not Currently  Other Topics Concern  . Not on file  Social History Narrative  . Not on file   Social Determinants of Health   Financial Resource Strain: Not on file  Food Insecurity: Not on file  Transportation Needs: Not on file  Physical Activity: Not on file  Stress: Not on file  Social Connections: Not on file  Intimate Partner Violence: Not on file    Family History:    Family History  Problem Relation Age of Onset  . Hypertension Mother   . Diabetes Mother      ROS:  Please see the history of present illness.   All other ROS reviewed and negative.     Physical Exam/Data:   Vitals:   01/08/21 1500 01/08/21 1545 01/08/21 1600 01/08/21 1605  BP: (!) 129/97 (!) 142/94 (!) 143/105   Pulse: (!) 59 (!) 110 87   Resp:  13 13   Temp:      TempSrc:      SpO2: 99% 100% 100% 100%  Weight:      Height:       No intake or output data in the 24 hours ending 01/08/21 1703 Last 3 Weights 01/08/2021 12/25/2020 12/24/2020  Weight (lbs) 353 lb 13.4 oz 353 lb 13.4 oz 352 lb 2.6 oz  Weight (kg) 160.5 kg 160.5 kg 159.74 kg     Body mass index is  49.35 kg/m.  General:  Morbidly obese 55 yo in NAD  HEENT: normal Lymph: no adenopathy Neck: neck is full  No obvious JVD   No bruits  Endocrine:  No thryomegaly Vascular: No carotid bruits; DP pulses  2+ bilaterally  Cardiac:  normal S1, S2; RRR; no murmur Distant   Lungs:  MIld rhonchi  No rales    Abd: OBese  Nontender  no hepatomegaly  Ext: Tr edema Musculoskeletal:  No deformities, BUE and BLE strength normal and equal Skin: warm and dry  Neuro:  CNs 2-12 intact, no focal abnormalities noted Psych:  Normal affect   EKG:  The EKG was personally reviewed and demonstrates:  Atrial fibrillation with RVR   ST depression inferior and laterally, consider ischemia  (old)   Telemetry:  Telemetry was personally reviewed and demonstrates:  Afib with RVR     Relevant CV Studies: Echo  08/20/20  1. Left ventricular ejection fraction, by estimation, is 60 to 65%. The left ventricle has normal function. The left ventricle has no regional wall motion abnormalities. There is severe left ventricular hypertrophy. Left ventricular diastolic function could not be evaluated. 2. Right ventricular systolic function is normal. The right ventricular size is normal. 3. The mitral valve is normal in structure. No evidence of mitral valve regurgitation. No evidence of mitral stenosis. 4. The aortic valve has an indeterminant number of cusps. Aortic valve regurgitation is not visualized. No aortic stenosis is present. 5. Aortic dilatation noted. There is mild dilatation of the aortic root, measuring 38 mm. 6. The inferior vena cava is normal in size with greater than 50% respiratory variability, suggesting right atrial pressure of 3 mmHg.   R heart cath     July 2020     RA 12; RV 37/18; PA 40/25; PCWP 23  CO 7  CI 2.47  PVR 1.1 WU  Laboratory Data:  High Sensitivity Troponin:   Recent Labs  Lab 12/24/20 1254 12/24/20 1533 01/08/21 1016 01/08/21 1218  TROPONINIHS 28* 24* 25* 32*      Chemistry Recent Labs  Lab 01/08/21 1016 01/08/21 1033 01/08/21 1034 01/08/21 1103  NA 139 140 139 140  K 3.8 3.7 3.7 3.7  CL 100  --  96*  --   CO2 31  --   --   --   GLUCOSE 127*  --  133*  --   BUN 15  --  17  --   CREATININE 1.32*  --  1.30*  --   CALCIUM 9.0  --   --   --   GFRNONAA >60  --   --   --   ANIONGAP 8  --   --   --     Recent Labs  Lab 01/08/21 1016  PROT 6.9  ALBUMIN 3.4*  AST 23  ALT 17  ALKPHOS 43  BILITOT 0.2*   Hematology Recent Labs  Lab 01/08/21 1016 01/08/21 1033 01/08/21 1034 01/08/21 1103  WBC 5.8  --   --   --   RBC 5.13  --   --   --   HGB 12.9* 15.0 15.0 14.6  HCT 42.2 44.0 44.0 43.0  MCV 82.3  --   --   --   MCH 25.1*  --   --   --   MCHC 30.6  --   --   --   RDW 15.1  --   --   --   PLT 398  --   --   --    BNP Recent Labs  Lab 01/08/21 1017  BNP 18.7    DDimer No results for input(s): DDIMER in the last 168 hours.   Radiology/Studies:  DG Chest Portable 1 View  Result Date: 01/08/2021 CLINICAL DATA:  Shortness of breath, chest pain, respiratory distress, crackles in all lung fields EXAM: PORTABLE CHEST  1 VIEW COMPARISON:  Portable exam 1035 hours compared to 12/24/2020 FINDINGS: Enlargement of cardiac silhouette. Mediastinal contours and pulmonary vascularity normal. Peribronchial thickening and accentuation of perihilar markings favoring pulmonary edema, improved. No pleural effusion or pneumothorax. Bones demineralized. IMPRESSION: Enlargement of cardiac silhouette with improved pulmonary edema. Electronically Signed   By: Ulyses Southward M.D.   On: 01/08/2021 10:58     Assessment and Plan:   1  Atrial fibrillation Pt with atrial fibrillation in Dec   Had TEE/ Cardioversion   I don't think he senses his AFib   Not clear when it started  Cocaine use did not help  Untreated OSA dd not help     He also has missed some of anticoagulant For now would rate control with diltiazem  Increase rate    Can add digoxin 0.5 mg now and  then 0.25 Given cocaine use would not use b blocker    The pt has only had cardioversion x1   He has not been ablated  If rates not able t obe controlled or he is severely symptomatic would require TEE prior given lapse in anticoagulant Need to secure CPAP  2  Diastolic CHF  Exam is difficult given size  Symptomatically he has improved some wit hBipap and lasix   Would continue diuresis    Continue rate control attempts     3  Hx PE  Keep on anticoagulation     4  Hx OSA  He does not have at home   5  Hx HTN  BP is very high  Increase diltiazem   Add hydratazine tonight   ARB in am if BP up   Unfort with cocaine user b blocker not a good option    6   Morbid obesity     Needs to lose wt.    Will continue t ofollow.    For questions or updates, please contact [facility] Please consult [email or web address] for contact info under    Signed, Dietrich Pates, MD  01/08/2021 5:03 PM

## 2021-01-09 DIAGNOSIS — I5033 Acute on chronic diastolic (congestive) heart failure: Secondary | ICD-10-CM

## 2021-01-09 DIAGNOSIS — I10 Essential (primary) hypertension: Secondary | ICD-10-CM

## 2021-01-09 DIAGNOSIS — E662 Morbid (severe) obesity with alveolar hypoventilation: Secondary | ICD-10-CM

## 2021-01-09 DIAGNOSIS — F1410 Cocaine abuse, uncomplicated: Secondary | ICD-10-CM

## 2021-01-09 LAB — GLUCOSE, CAPILLARY
Glucose-Capillary: 143 mg/dL — ABNORMAL HIGH (ref 70–99)
Glucose-Capillary: 202 mg/dL — ABNORMAL HIGH (ref 70–99)
Glucose-Capillary: 190 mg/dL — ABNORMAL HIGH (ref 70–99)
Glucose-Capillary: 228 mg/dL — ABNORMAL HIGH (ref 70–99)

## 2021-01-09 LAB — RENAL FUNCTION PANEL
Albumin: 3.1 g/dL — ABNORMAL LOW (ref 3.5–5.0)
Anion gap: 9 (ref 5–15)
BUN: 16 mg/dL (ref 6–20)
CO2: 29 mmol/L (ref 22–32)
Calcium: 8.8 mg/dL — ABNORMAL LOW (ref 8.9–10.3)
Chloride: 99 mmol/L (ref 98–111)
Creatinine, Ser: 1.14 mg/dL (ref 0.61–1.24)
GFR, Estimated: >60 mL/min (ref >60)
Glucose, Bld: 115 mg/dL — ABNORMAL HIGH (ref 70–99)
Phosphorus: 4.7 mg/dL — ABNORMAL HIGH (ref 2.5–4.6)
Potassium: 3.7 mmol/L (ref 3.5–5.1)
Sodium: 137 mmol/L (ref 135–145)

## 2021-01-09 MED ORDER — DILTIAZEM HCL 60 MG PO TABS
90.0000 mg | ORAL_TABLET | Freq: Four times a day (QID) | ORAL | Status: DC
  Administered 2021-01-09 – 2021-01-10 (×5): 90 mg via ORAL
  Filled 2021-01-09 (×5): qty 1

## 2021-01-09 MED ORDER — METFORMIN HCL 500 MG PO TABS
500.0000 mg | ORAL_TABLET | Freq: Two times a day (BID) | ORAL | Status: DC
  Administered 2021-01-09 – 2021-01-10 (×2): 500 mg via ORAL
  Filled 2021-01-09 (×2): qty 1

## 2021-01-09 MED ORDER — TORSEMIDE 20 MG PO TABS
20.0000 mg | ORAL_TABLET | Freq: Two times a day (BID) | ORAL | Status: DC
  Administered 2021-01-09 – 2021-01-10 (×3): 20 mg via ORAL
  Filled 2021-01-09 (×3): qty 1

## 2021-01-09 NOTE — Progress Notes (Signed)
Pt agreed to wear BiPAP tonight. Pt stated he knew he needed to wear it or he would feel worse. Pt had BiPAP on for less than 1 hour tonight before refusing.

## 2021-01-09 NOTE — Progress Notes (Signed)
Pt would not allow staff to take bedtime vital signs until he had 2 snacks tonight first and multiple drinks. Pt stated "to hell with this" and had threatened to leave AMA.

## 2021-01-09 NOTE — Progress Notes (Signed)
Progress Note  Patient Name: Benjamin [name] Date of Encounter: 01/09/2021  [facility] Cardiologist: Dietrich Pates, MD   Subjective   No CP or dyspnea  Inpatient Medications    Scheduled Meds: . rivaroxaban  20 mg Oral Q supper   Continuous Infusions: . diltiazem (CARDIZEM) infusion 12.5 mg/hr (01/08/21 2222)   PRN Meds: acetaminophen   Vital Signs    Vitals:   01/09/21 0000 01/09/21 0036 01/09/21 0200 01/09/21 0416  BP:  128/72  131/71  Pulse:  67  64  Resp: 14 14 16 15   Temp:  97.8 F (36.6 C)  97.7 F (36.5 C)  TempSrc:  Oral  Oral  SpO2:  100%  98%  Weight:    (!) 161 kg  Height:        Intake/Output Summary (Last 24 hours) at 01/09/2021 0754 Last data filed at 01/08/2021 2222 Gross per 24 hour  Intake 801.16 ml  Output 575 ml  Net 226.16 ml   Last 3 Weights 01/09/2021 01/08/2021 01/08/2021  Weight (lbs) 354 lb 14.4 oz 354 lb 8 oz 353 lb 13.4 oz  Weight (kg) 160.982 kg 160.8 kg 160.5 kg      Telemetry    Atrial fibrillation rate upper normal - Personally Reviewed   Physical Exam   GEN: No acute distress.  obese Neck: No JVD Cardiac: irregular Respiratory: Clear to auscultation bilaterally. GI: Soft, nontender, non-distended  MS: No edema Neuro:  Nonfocal  Psych: Normal affect   Labs    High Sensitivity Troponin:   Recent Labs  Lab 12/24/20 1533 01/08/21 1016 01/08/21 1218 01/08/21 1756 01/08/21 2041  TROPONINIHS 24* 25* 32* 37* 34*      Chemistry Recent Labs  Lab 01/08/21 1016 01/08/21 1033 01/08/21 1034 01/08/21 1103 01/09/21 0346  NA 139   < > 139 140 137  K 3.8   < > 3.7 3.7 3.7  CL 100  --  96*  --  99  CO2 31  --   --   --  29  GLUCOSE 127*  --  133*  --  115*  BUN 15  --  17  --  16  CREATININE 1.32*  --  1.30*  --  1.14  CALCIUM 9.0  --   --   --  8.8*  PROT 6.9  --   --   --   --   ALBUMIN 3.4*  --   --   --  3.1*  AST 23  --   --   --   --   ALT 17  --   --   --   --   ALKPHOS 43  --   --   --   --    BILITOT 0.2*  --   --   --   --   GFRNONAA >60  --   --   --  >60  ANIONGAP 8  --   --   --  9   < > = values in this interval not displayed.     Hematology Recent Labs  Lab 01/08/21 1016 01/08/21 1033 01/08/21 1034 01/08/21 1103  WBC 5.8  --   --   --   RBC 5.13  --   --   --   HGB 12.9* 15.0 15.0 14.6  HCT 42.2 44.0 44.0 43.0  MCV 82.3  --   --   --   MCH 25.1*  --   --   --  MCHC 30.6  --   --   --   RDW 15.1  --   --   --   PLT 398  --   --   --     BNP Recent Labs  Lab 01/08/21 1017  BNP 18.7     Radiology    DG Chest Portable 1 View  Result Date: 01/08/2021 CLINICAL DATA:  Shortness of breath, chest pain, respiratory distress, crackles in all lung fields EXAM: PORTABLE CHEST 1 VIEW COMPARISON:  Portable exam 1035 hours compared to 12/24/2020 FINDINGS: Enlargement of cardiac silhouette. Mediastinal contours and pulmonary vascularity normal. Peribronchial thickening and accentuation of perihilar markings favoring pulmonary edema, improved. No pleural effusion or pneumothorax. Bones demineralized. IMPRESSION: Enlargement of cardiac silhouette with improved pulmonary edema. Electronically Signed   By: Ulyses Southward M.D.   On: 01/08/2021 10:58    Patient Profile     55 y.o. male with past medical history of diastolic congestive heart failure, COPD, prior pulmonary embolus, paroxysmal atrial fibrillation, hypertension, obstructive sleep apnea, diabetes mellitus, substance abuse being evaluated for atrial fibrillation.  Also noted to be positive for cocaine.  Echocardiogram December 2021 showed normal LV function, severe left ventricular hypertrophy.  Assessment & Plan    1 persistent atrial fibrillation-patient remains in atrial fibrillation today but symptoms have improved.  Not clear to me that patient is compliant with medications.  We will discontinue IV Cardizem and treat with Cardizem 90 mg every 6 hours.  Follow heart rate and blood pressure.  If stable will  transition to Cardizem CD tomorrow morning.  Last echocardiogram showed normal LV function.  Would favor rate control and anticoagulation with follow-up in the office.  If he remains compliant with Xarelto could plan outpatient cardioversion after 3 weeks of therapeutic anticoagulation.  2 acute on chronic diastolic congestive heart failure-patient appears to be euvolemic today.  He was taking Demadex 60 mg twice daily at home.  Will resume 20 mg twice daily today.  Follow renal function closely.  3 hypertension-patient by report was on multiple blood pressure medications at home.  Blood pressure has improved here.  We will continue Cardizem and add Demadex.  Follow blood pressure closely and resume remaining medications as needed.  We will avoid beta-blockade in setting of cocaine use.  4 noncompliance-I discussed the importance of medical compliance.  5 cocaine abuse-I discussed importance of avoiding.  6 morbid obesity-needs weight loss.  Also needs CPAP at home.  For questions or updates, please contact [facility] Please consult [email or web address] for contact info under        Signed, Olga Millers, MD  01/09/2021, 7:54 AM

## 2021-01-09 NOTE — Progress Notes (Signed)
Cardizem drip continues. Pt's HR 80s-100 while sleeping. HR 120s-140s while awake and sitting on edge of bed.
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
## 2021-01-10 ENCOUNTER — Other Ambulatory Visit: Payer: Self-pay | Admitting: Nurse Practitioner

## 2021-01-10 DIAGNOSIS — J9601 Acute respiratory failure with hypoxia: Secondary | ICD-10-CM

## 2021-01-10 DIAGNOSIS — I5030 Unspecified diastolic (congestive) heart failure: Secondary | ICD-10-CM

## 2021-01-10 DIAGNOSIS — I482 Chronic atrial fibrillation, unspecified: Secondary | ICD-10-CM

## 2021-01-10 DIAGNOSIS — G4733 Obstructive sleep apnea (adult) (pediatric): Secondary | ICD-10-CM

## 2021-01-10 DIAGNOSIS — N179 Acute kidney failure, unspecified: Secondary | ICD-10-CM

## 2021-01-10 DIAGNOSIS — I214 Non-ST elevation (NSTEMI) myocardial infarction: Secondary | ICD-10-CM

## 2021-01-10 DIAGNOSIS — G9341 Metabolic encephalopathy: Secondary | ICD-10-CM

## 2021-01-10 DIAGNOSIS — J9602 Acute respiratory failure with hypercapnia: Secondary | ICD-10-CM

## 2021-01-10 LAB — GLUCOSE, CAPILLARY: Glucose-Capillary: 126 mg/dL — ABNORMAL HIGH (ref 70–99)

## 2021-01-10 LAB — RENAL FUNCTION PANEL
Albumin: 3.1 g/dL — ABNORMAL LOW (ref 3.5–5.0)
Anion gap: 8 (ref 5–15)
BUN: 16 mg/dL (ref 6–20)
CO2: 32 mmol/L (ref 22–32)
Calcium: 8.7 mg/dL — ABNORMAL LOW (ref 8.9–10.3)
Chloride: 97 mmol/L — ABNORMAL LOW (ref 98–111)
Creatinine, Ser: 1.34 mg/dL — ABNORMAL HIGH (ref 0.61–1.24)
GFR, Estimated: >60 mL/min (ref >60)
Glucose, Bld: 141 mg/dL — ABNORMAL HIGH (ref 70–99)
Phosphorus: 3.8 mg/dL (ref 2.5–4.6)
Potassium: 3.9 mmol/L (ref 3.5–5.1)
Sodium: 137 mmol/L (ref 135–145)

## 2021-01-10 MED ORDER — DILTIAZEM HCL ER COATED BEADS 180 MG PO CP24
360.0000 mg | ORAL_CAPSULE | Freq: Every day | ORAL | Status: DC
  Administered 2021-01-10: 360 mg via ORAL
  Filled 2021-01-10: qty 2

## 2021-01-10 MED ORDER — HYDRALAZINE HCL 50 MG PO TABS
50.0000 mg | ORAL_TABLET | Freq: Three times a day (TID) | ORAL | Status: DC
  Administered 2021-01-10: 50 mg via ORAL
  Filled 2021-01-10: qty 1

## 2021-01-10 MED ORDER — LOSARTAN POTASSIUM 50 MG PO TABS
50.0000 mg | ORAL_TABLET | Freq: Every day | ORAL | Status: DC
  Administered 2021-01-10: 50 mg via ORAL
  Filled 2021-01-10: qty 1

## 2021-01-10 MED ORDER — MISC. DEVICES MISC: 0 refills | Status: DC

## 2021-01-10 MED ORDER — DILTIAZEM HCL ER COATED BEADS 360 MG PO CP24: 360.0000 mg | ORAL_CAPSULE | Freq: Every day | ORAL | 0 refills | Status: DC

## 2021-01-10 NOTE — Progress Notes (Signed)
Patient has orders to discharge to home. AVS reviewed with patient and wife at bedside. Both agreed to understanding instructions and information provided on the AVS. Denied any questions or concerns. Patient very eager to leave at this time. IV's removed. Pt provided with a belongings bag. All belongings packed by wife and carried out with patient. Pt taken to private vehicle via wheelchair.

## 2021-01-10 NOTE — Progress Notes (Signed)
Progress Note  Patient Name: Benjamin [name] Date of Encounter: 01/10/2021  [facility] Cardiologist: Dietrich Pates, MD   Subjective   Pt denies CP or dyspnea  Inpatient Medications    Scheduled Meds: . diltiazem  90 mg Oral Q6H  . metFORMIN  500 mg Oral BID WC  . rivaroxaban  20 mg Oral Q supper  . torsemide  20 mg Oral BID   Continuous Infusions:  PRN Meds: acetaminophen   Vital Signs    Vitals:   01/09/21 2244 01/10/21 0000 01/10/21 0030 01/10/21 0501  BP:  (!) 157/101  (!) 159/77  Pulse: 82 86  76  Resp: 20 17  20   Temp:  98.3 F (36.8 C)  98.4 F (36.9 C)  TempSrc:  Oral  Oral  SpO2: 100% 98%  99%  Weight:   (!) 163.8 kg   Height:        Intake/Output Summary (Last 24 hours) at 01/10/2021 0818 Last data filed at 01/10/2021 0600 Gross per 24 hour  Intake 820 ml  Output 1750 ml  Net -930 ml   Last 3 Weights 01/10/2021 01/09/2021 01/08/2021  Weight (lbs) 361 lb 3.2 oz 354 lb 14.4 oz 354 lb 8 oz  Weight (kg) 163.839 kg 160.982 kg 160.8 kg      Telemetry    Atrial fibrillation converting to sinus with PVC - Personally Reviewed   Physical Exam   GEN: WD obese NAD Neck: supple Cardiac: RRR Respiratory: CTA GI: Soft, NT/ND MS: No edema Neuro:  Grossly intact Psych: Normal affect   Labs    High Sensitivity Troponin:   Recent Labs  Lab 12/24/20 1533 01/08/21 1016 01/08/21 1218 01/08/21 1756 01/08/21 2041  TROPONINIHS 24* 25* 32* 37* 34*      Chemistry Recent Labs  Lab 01/08/21 1016 01/08/21 1033 01/08/21 1034 01/08/21 1103 01/09/21 0346 01/10/21 0156  NA 139   < > 139 140 137 137  K 3.8   < > 3.7 3.7 3.7 3.9  CL 100  --  96*  --  99 97*  CO2 31  --   --   --  29 32  GLUCOSE 127*  --  133*  --  115* 141*  BUN 15  --  17  --  16 16  CREATININE 1.32*  --  1.30*  --  1.14 1.34*  CALCIUM 9.0  --   --   --  8.8* 8.7*  PROT 6.9  --   --   --   --   --   ALBUMIN 3.4*  --   --   --  3.1* 3.1*  AST 23  --   --   --   --   --   ALT  17  --   --   --   --   --   ALKPHOS 43  --   --   --   --   --   BILITOT 0.2*  --   --   --   --   --   GFRNONAA >60  --   --   --  >60 >60  ANIONGAP 8  --   --   --  9 8   < > = values in this interval not displayed.     Hematology Recent Labs  Lab 01/08/21 1016 01/08/21 1033 01/08/21 1034 01/08/21 1103  WBC 5.8  --   --   --   RBC 5.13  --   --   --  HGB 12.9* 15.0 15.0 14.6  HCT 42.2 44.0 44.0 43.0  MCV 82.3  --   --   --   MCH 25.1*  --   --   --   MCHC 30.6  --   --   --   RDW 15.1  --   --   --   PLT 398  --   --   --     BNP Recent Labs  Lab 01/08/21 1017  BNP 18.7     Radiology    DG Chest Portable 1 View  Result Date: 01/08/2021 CLINICAL DATA:  Shortness of breath, chest pain, respiratory distress, crackles in all lung fields EXAM: PORTABLE CHEST 1 VIEW COMPARISON:  Portable exam 1035 hours compared to 12/24/2020 FINDINGS: Enlargement of cardiac silhouette. Mediastinal contours and pulmonary vascularity normal. Peribronchial thickening and accentuation of perihilar markings favoring pulmonary edema, improved. No pleural effusion or pneumothorax. Bones demineralized. IMPRESSION: Enlargement of cardiac silhouette with improved pulmonary edema. Electronically Signed   By: Ulyses Southward M.D.   On: 01/08/2021 10:58    Patient Profile     55 y.o. male with past medical history of diastolic congestive heart failure, COPD, prior pulmonary embolus, paroxysmal atrial fibrillation, hypertension, obstructive sleep apnea, diabetes mellitus, substance abuse being evaluated for atrial fibrillation.  Also noted to be positive for cocaine.  Echocardiogram December 2021 showed normal LV function, severe left ventricular hypertrophy.  Assessment & Plan    1 persistent atrial fibrillation-patient has converted to sinus rhythm.  Change Cardizem to 360 mg CD daily.  We will continue Xarelto though not clear to me patient is compliant with medications.  Note echocardiogram previously  showed normal LV function.   2 chronic diastolic congestive heart failure-patient appears to be euvolemic.  We will continue lower dose Demadex following discharge.  Would check potassium and renal function 1 week after discharge.  3 hypertension-blood pressure is increasing.  Would resume hydralazine 50 mg 3 times daily and losartan 50 mg daily.  Would not treat with beta-blockade in the setting of cocaine use.  He will need close follow-up for blood pressure checks following discharge.  4 noncompliance-I previously discussed the importance of medical compliance.  5 cocaine abuse-I previously discussed importance of avoiding.  6 morbid obesity-needs weight loss.  Also needs CPAP at home.  Patient can be discharged on present medications from a cardiac standpoint.  He needs BMET one week following DC.  Would arrange follow-up with primary care 1 to 2 weeks after discharge for blood pressure check.  We will arrange follow-up with APP 2 to 4 weeks following discharge.  Follow-up Dr. Tenny Craw 3 months.  Please call with questions.  For questions or updates, please contact [facility] Please consult [email or web address] for contact info under        Signed, Olga Millers, MD  01/10/2021, 8:18 AM

## 2021-01-10 NOTE — Progress Notes (Signed)
Pt converted to NSR, ECG confirmed this, MD notified, will continue to monitor, Thanks Lavonda Jumbo RN.

## 2021-01-10 NOTE — Discharge Summary (Signed)
Name: Benjamin [name] MRN: [id number] DOB: 1966/08/19 55 y.o. PCP: Benjamin Pounds, NP  Date of Admission: 01/08/2021 10:08 AM Date of Discharge:  Attending Physician: Sid Falcon, MD  Discharge Diagnosis: 1. Atrial fibrillation with RVR 2. NICM 3. Diastolic CHF 4. Type II NSTEMI 5. AKI 6. Acute metabolic encephalopathy 7. Acute hypoxic and hypercapnic respiratory failure 8. Obesity Hypoventilation Syndrome 9. Obstructive Sleep Apnea 10. Cocaine use disorder 11. Morbid obesity  Discharge Medications: Allergies as of 01/10/2021   No Known Allergies     Medication List    TAKE these medications   atorvastatin 40 MG tablet Commonly known as: LIPITOR Take 1 tablet (40 mg total) by mouth at bedtime.   Blood Pressure Monitor Devi Please provide patient with insurance approved blood pressure device with L-XL cuff. BMI 55 What changed:   how much to take  how to take this  when to take this   Caplyta 42 MG Caps Generic drug: Lumateperone Tosylate Take 42 mg by mouth at bedtime.   carboxymethylcellulose 0.5 % Soln Commonly known as: REFRESH PLUS Place 1 drop into both eyes daily.   diltiazem 360 MG 24 hr capsule Commonly known as: CARDIZEM CD Take 1 capsule (360 mg total) by mouth daily. What changed:   medication strength  how much to take   docusate sodium 100 MG capsule Commonly known as: COLACE Take 100 mg by mouth 2 (two) times daily.   doxazosin 2 MG tablet Commonly known as: Cardura Take 1 tablet (2 mg total) by mouth daily.   ferrous sulfate 325 (65 FE) MG tablet Take 1 tablet (325 mg total) by mouth daily.   hydrALAZINE 50 MG tablet Commonly known as: APRESOLINE Take 1 tablet (50 mg total) by mouth 3 (three) times daily.   labetalol 200 MG tablet Commonly known as: NORMODYNE TAKE ONE TABLET BY MOUTH TWICE DAILY ( NOON +BEDTIME) What changed: See the new instructions.   losartan 100 MG tablet Commonly known as: COZAAR Take 1  tablet (100 mg total) by mouth daily.   magnesium oxide 400 (241.3 Mg) MG tablet Commonly known as: MAG-OX TAKE 2 TABLETS BY MOUTH EVERY MORNING AND 1 TABLET EVERY EVENING NOON+PM+BEDTIME) What changed: See the new instructions.   metFORMIN 500 MG tablet Commonly known as: GLUCOPHAGE TAKE ONE TABLET BY MOUTH TWICE DAILY WITH A MEAL (NOON+BEDTIME) What changed: See the new instructions.   Misc. Devices Misc Requires O2 @ 3L/min continuously via nasal canula and home fill system,. What changed:   how much to take  how to take this  when to take this   Misc. Devices Misc Please provide Mr. Jalomo with insurance approved portable O2 concentrator ICD 10 J96.11 Z99.81 What changed:   how much to take  how to take this  when to take this   Misc. Devices Misc Please provide patient with insurance approved LARGE right hand splint ICD 10 G56.01 What changed: Another medication with the same name was changed. Make sure you understand how and when to take each.   omeprazole 20 MG capsule Commonly known as: PRILOSEC TAKE 1 CAPSULE BY MOUTH 2 (TWO) TIMES DAILY BEFORE A MEAL. TAKE FIRST CAPSULE 30 MIN PRIOR TO EATING OR TAKING OTHER MEDICATIONS. (AM+BEDTIME) What changed: See the new instructions.   potassium chloride 10 MEQ tablet Commonly known as: KLOR-CON Take 8 tablets (80 mEq total) by mouth daily.   Proventil HFA 108 (90 Base) MCG/ACT inhaler Generic drug: albuterol INHALE 2 PUFFS BY MOUTH  EVERY 6 (SIX) HOURS AS NEEDED FOR WHEEZING OR SHORTNESS OF BREATH. What changed:   how much to take  how to take this  when to take this  reasons to take this  additional instructions   spironolactone 50 MG tablet Commonly known as: ALDACTONE TAKE 1 TABLET (50 MG TOTAL) BY MOUTH DAILY (NOON) What changed: See the new instructions.   torsemide 20 MG tablet Commonly known as: DEMADEX Take $RemoveBefo'60mg'cBvpJFgsarC$  by mouth twice daily What changed:   how much to take  how to take  this  when to take this  additional instructions   True Metrix Blood Glucose Test test strip Generic drug: glucose blood Use as instructed. Check blood glucose level by fingerstick twice per day.   True Metrix Meter w/Device Kit Use as instructed. Check blood glucose level by fingerstick twice per day.   TRUEplus Lancets 28G Misc Use as instructed. Check blood glucose level by fingerstick twice per day.   Xarelto 20 MG Tabs tablet Generic drug: rivaroxaban TAKE 1 TABLET BY MOUTH DAILY WITH SUPPER (BEDTIME) What changed: See the new instructions.       Disposition and follow-up:   [name] was discharged from [facility] in Stable condition.  At the hospital follow up visit please address:  Atrial fibrillation with RVR (resolved). Spontaneously converted to sinus rhythm 4/23. Paroxysmal atrial fibrillation s/p cardioversion 16/1096, diastolic heart failure Cocaine use disorder Untreated OSA/OHS Medication changes: INCREASE cardizem CD to $Rem'360mg'NhVX$  daily, DISCONTINUE labetalol (in setting of cocaine use) Follow up recommendations -ensure medication compliance and cocaine cessation -check BMP -will follow up with cardiology in 2-4 weeks. If he has been taking anticoagulation consistently at that time, they will consider cardioversion -ensure that he receives the [place] / imaging needed at time of follow-up: BMP  Pending labs/ test needing follow-up: none  Follow-up Appointments:  Follow-up Information    Benjamin Pounds, NP. Schedule an appointment as soon as possible for a visit in 1 week(s).   Specialty: Nurse Practitioner Contact information: [place] [address] [phone]        [name], MD .   Specialty: Cardiology Contact information: [address] [place] [address] [place] [address] [phone]               Hospital Course:  [name] is a 55 year old male with  paroxysmal atrial fibrillation, diastolic heart failure, untreated OSA and OHS, and cocaine use disorder who was recently admitted to our service from 4/7- 12/25/2020 for hypertensive emergency in the setting of cocaine use.  He presented to [facility] emergency department via EMS on 4/22 after calling EMS for shortness of breath.  On EMS arrival, he was in respiratory distress and he was placed on BiPAP.  EKG tracing showed A. fib with RVR. Work-up in the ED, including chest x-ray, revealed pulmonary edema.  UDS is positive for cocaine.  Troponins were elevated and peaked at 37.  Labs showed a mild AKI.  ABG showed hypercapnia.  He was placed on diltiazem drip for his A. fib with RVR.  Due to recurrent A. fib RVR, I consulted cardiology to discuss role of cardioversion or ablation.  Due to his poor med compliance, they felt that he would be better for him to follow-up with them outpatient after he has been consistently taking Xarelto for a few weeks, at which time they could consider cardioversion. His rate improved and he was transitioned back  to p.o. Cardizem.  Mental status improved and he was able to come off the BiPAP.  He spontaneously converted back into sinus rhythm on the night of hospital day 1 and was stable for discharge the following morning, on hospital day #2  Discharge Subjective: Feeling much better this morning and wishing to discharge. I reiterated the importance of abstaining from cocaine.   Discharge Vitals:   BP (!) 154/90 (BP Location: Left Arm)   Pulse 73   Temp 98.6 F (37 C) (Oral)   Resp 20   Ht 5' 11"  (1.803 m)   Wt (!) 163.8 kg   SpO2 (!) 89%   BMI 50.38 kg/m    General: morbidly obese, in no acute distress Cardiac: RRR, no lower extremity edema. Body habitus precluding JVD evaluation. Extremities warm Pulm: lung sounds distant. No adventitious sounds appreciated  Pertinent Labs, Studies, and Procedures:  CBC Latest Ref Rng & Units 01/08/2021 01/08/2021 01/08/2021   WBC 4.0 - 10.5 K/uL - - -  Hemoglobin 13.0 - 17.0 g/dL 14.6 15.0 15.0  Hematocrit 39.0 - 52.0 % 43.0 44.0 44.0  Platelets 150 - 400 K/uL - - -   BMP Latest Ref Rng & Units 01/10/2021 01/09/2021 01/08/2021  Glucose 70 - 99 mg/dL 141(H) 115(H) -  BUN 6 - 20 mg/dL 16 16 -  Creatinine 0.61 - 1.24 mg/dL 1.34(H) 1.14 -  Sodium 135 - 145 mmol/L 137 137 140  Potassium 3.5 - 5.1 mmol/L 3.9 3.7 3.7  Chloride 98 - 111 mmol/L 97(L) 99 -  CO2 22 - 32 mmol/L 32 29 -  Calcium 8.9 - 10.3 mg/dL 8.7(L) 8.8(L) -   DG Chest Portable 1 View Result Date: 01/08/2021 CLINICAL DATA:  Shortness of breath, chest pain, respiratory distress, crackles in all lung fields EXAM: PORTABLE CHEST 1 VIEW COMPARISON:  Portable exam 1035 hours compared to 12/24/2020 FINDINGS: Enlargement of cardiac silhouette. Mediastinal contours and pulmonary vascularity normal. Peribronchial thickening and accentuation of perihilar markings favoring pulmonary edema, improved. No pleural effusion or pneumothorax. Bones demineralized. IMPRESSION: Enlargement of cardiac silhouette with improved pulmonary edema. Electronically Signed   By: Lavonia Dana M.D.   On: 01/08/2021 10:58    Discharge Instructions: Discharge Instructions    (HEART FAILURE PATIENTS) Call MD:  Anytime you have any of the following symptoms: 1) 3 pound weight gain in 24 hours or 5 [name] in 1 week 2) shortness of breath, with or without a dry hacking cough 3) swelling in the hands, feet or stomach 4) if you have to sleep on extra pillows at night in order to breathe.   Complete by: As directed    Call MD for:  difficulty breathing, headache or visual disturbances   Complete by: As directed    Call MD for:  persistant dizziness or light-headedness   Complete by: As directed    Diet - low sodium heart healthy   Complete by: As directed    Heart Failure patients record your daily weight using the same scale at the same time of day   Complete by: As directed    Increase  activity slowly   Complete by: As directed    STOP any activity that causes chest pain, shortness of breath, dizziness, sweating, or exessive weakness   Complete by: As directed       Signed:  Mitzi Hansen, MD Internal Medicine Resident PGY-2 [facility] Pager: [phone] 01/10/2021 9:58 AM

## 2021-01-10 NOTE — Discharge Instructions (Signed)
Atrial Fibrillation  Atrial fibrillation is a type of irregular or rapid heartbeat (arrhythmia). In atrial fibrillation, the top part of the heart (atria) beats in an irregular pattern. This makes the heart unable to pump blood normally and effectively. The goal of treatment is to prevent blood clots from forming, control your heart rate, or restore your heartbeat to a normal rhythm. If this condition is not treated, it can cause serious problems, such as a weakened heart muscle (cardiomyopathy) or a stroke. What are the causes? This condition is often caused by medical conditions that damage the heart's electrical system. These include:  High blood pressure (hypertension). This is the most common cause.  Certain heart problems or conditions, such as heart failure, coronary artery disease, heart valve problems, or heart surgery.  Diabetes.  Overactive thyroid (hyperthyroidism).  Obesity.  Chronic kidney disease. In some cases, the cause of this condition is not known. What increases the risk? This condition is more likely to develop in:  Older people.  People who smoke.  Athletes who do endurance exercise.  People who have a family history of atrial fibrillation.  Men.  People who use drugs.  People who drink a lot of alcohol.  People who have lung conditions, such as emphysema, pneumonia, or COPD.  People who have obstructive sleep apnea. What are the signs or symptoms? Symptoms of this condition include:  A feeling that your heart is racing or beating irregularly.  Discomfort or pain in your chest.  Shortness of breath.  Sudden light-headedness or weakness.  Tiring easily during exercise or activity.  Fatigue.  Syncope (fainting).  Sweating. In some cases, there are no symptoms. How is this diagnosed? Your health care provider may detect atrial fibrillation when taking your pulse. If detected, this condition may be diagnosed with:  An electrocardiogram  (ECG) to check electrical signals of the heart.  An ambulatory cardiac monitor to record your heart's activity for a few days.  A transthoracic echocardiogram (TTE) to create pictures of your heart.  A transesophageal echocardiogram (TEE) to create even closer pictures of your heart.  A stress test to check your blood supply while you exercise.  Imaging tests, such as a CT scan or chest X-ray.  Blood tests. How is this treated? Treatment depends on underlying conditions and how you feel when you experience atrial fibrillation. This condition may be treated with:  Medicines to prevent blood clots or to treat heart rate or heart rhythm problems.  Electrical cardioversion to reset the heart's rhythm.  A pacemaker to correct abnormal heart rhythm.  Ablation to remove the heart tissue that sends abnormal signals.  Left atrial appendage closure to seal the area where blood clots can form. In some cases, underlying conditions will be treated. Follow these instructions at home: Medicines  Take over-the counter and prescription medicines only as told by your health care provider.  Do not take any new medicines without talking to your health care provider.  If you are taking blood thinners: ? Talk with your health care provider before you take any medicines that contain aspirin or NSAIDs, such as ibuprofen. These medicines increase your risk for dangerous bleeding. ? Take your medicine exactly as told, at the same time every day. ? Avoid activities that could cause injury or bruising, and follow instructions about how to prevent falls. ? Wear a medical alert bracelet or carry a card that lists what medicines you take. Lifestyle  Do not use any products that contain nicotine or  tobacco, such as cigarettes, e-cigarettes, and chewing tobacco. If you need help quitting, ask your health care provider.  Eat heart-healthy foods. Talk with a dietitian to make an eating plan that is right for  you.  Exercise regularly as told by your health care provider.  Do not drink alcohol.  Lose weight if you are overweight.  Do not use drugs, including cannabis.      General instructions  If you have obstructive sleep apnea, manage your condition as told by your health care provider.  Do not use diet pills unless your health care provider approves. Diet pills can make heart problems worse.  Keep all follow-up visits as told by your health care provider. This is important. Contact a health care provider if you:  Notice a change in the rate, rhythm, or strength of your heartbeat.  Are taking a blood thinner and you notice more bruising.  Tire more easily when you exercise or do heavy work.  Have a sudden change in weight. Get help right away if you have:  Chest pain, abdominal pain, sweating, or weakness.  Trouble breathing.  Side effects of blood thinners, such as blood in your vomit, stool, or urine, or bleeding that cannot stop.  Any symptoms of a stroke. "BE FAST" is an easy way to remember the main warning signs of a stroke: ? B - Balance. Signs are dizziness, sudden trouble walking, or loss of balance. ? E - Eyes. Signs are trouble seeing or a sudden change in vision. ? F - Face. Signs are sudden weakness or numbness of the face, or the face or eyelid drooping on one side. ? A - Arms. Signs are weakness or numbness in an arm. This happens suddenly and usually on one side of the body. ? S - Speech. Signs are sudden trouble speaking, slurred speech, or trouble understanding what people say. ? T - Time. Time to call emergency services. Write down what time symptoms started.  Other signs of a stroke, such as: ? A sudden, severe headache with no known cause. ? Nausea or vomiting. ? Seizure. These symptoms may represent a serious problem that is an emergency. Do not wait to see if the symptoms will go away. Get medical help right away. Call your local emergency services  (911 in the [place] Do not drive yourself to the hospital.   Summary  Atrial fibrillation is a type of irregular or rapid heartbeat (arrhythmia).  Symptoms include a feeling that your heart is beating fast or irregularly.  You may be given medicines to prevent blood clots or to treat heart rate or heart rhythm problems.  Get help right away if you have signs or symptoms of a stroke.  Get help right away if you cannot catch your breath or have chest pain or pressure. This information is not intended to replace advice given to you by your health care provider. Make sure you discuss any questions you have with your health care provider. Document Revised: 02/27/2019 Document Reviewed: 02/27/2019 Elsevier Patient Education  2021 Elsevier Inc.   Cocaine Use Disorder Cocaine use disorder is a condition in which your cocaine use disrupts your daily life. Cocaine belongs to a group of powerful drugs known as stimulants. Other names for cocaine include coke, crack, blow, snow, C, powder, and nose candy. This drug has some medical uses, but these are rare. Cocaine is most often misused because of its effects, which include:  A feeling of extreme pleasure (euphoria).  Alertness.  A high energy level. Cocaine use can affect your relationships and how you do your job. Cocaine use disorder can be dangerous because:  Cocaine increases your blood pressure.  Using cocaine can lead to a heart attack or stroke.  Cocaine use can cause fast or irregular heartbeats and seizures.  Non-medical cocaine use is illegal and can lead to criminal charges. Cocaine use can be life-threatening. What are the causes? This condition is caused by misusing cocaine. Many people start using cocaine because it makes them feel good or helps them deal with their lives. Over time, they get addicted to it. When they try to stop using it, they feel sick. What increases the risk? The following factors may make you more likely  to develop this condition:  Misusing other drugs.  A family history of misusing drugs.  History of mental illness. What are the signs or symptoms? Symptoms of this condition include:  Using more cocaine than you want to, or using cocaine for longer than you want to. You may crave cocaine, try several times to use less cocaine, or try to control your cocaine use. You may also need more and more cocaine to get the same effect that you want (build up a tolerance).  Spending a lot of time getting cocaine, using it, or recovering from its effects.  Having problems at work, at school, at home, or with relationships because of cocaine use.  Giving up or cutting down on important life activities because of cocaine use.  Using cocaine when it is dangerous, such as when driving a car.  Continuing to use cocaine even though it has led to a physical problem, such as poor nutrition, nosebleeds, and chest pain.  Continuing to use cocaine even though it is affecting your mental health. You may develop: ? Schizophrenia-like symptoms. You may think or act in an unusual way. ? Depression. ? Bipolar mood swings. ? Anxiety. ? Sleep problems. If you stop using cocaine, you may have symptoms of withdrawal. Symptoms include depression, sleep problems, and bad dreams. How is this diagnosed? This condition is diagnosed with an assessment. During the assessment, your health care provider will ask about your cocaine use and about how it affects your life. Your health care provider may also:  Do a physical exam or lab tests to see if you have a physical problem resulting from cocaine use.  Screen for drug use.  Refer you to a mental health professional for evaluation. How is this treated? Treatment for this condition is usually provided by mental health professionals with training in substance use disorders. Treatment may involve:  Counseling. This treatment is also called talk therapy. It is provided by  substance use treatment counselors. A counselor can address the reasons you use cocaine and suggest ways to keep you from using it again. The goals of talk therapy are to: ? Find healthy activities to replace using cocaine. ? Identify and avoid what triggers your cocaine use. ? Help you learn how to handle cravings.  Support groups. Support groups are run by people who have quit using addictive substances. They provide emotional support, advice, and guidance.  Medicines.  Care at a residential treatment center.   Follow these instructions at home: Medicines  Take over-the-counter and prescription medicines only as told by your health care provider.  Check with your health care provider before starting any new medicines, herbs, or supplements. General instructions  Do not use any drugs or alcohol.  Avoid people and activities that  trigger your use of cocaine.  Learn and practice techniques for managing stress.  Have a plan for vulnerable moments. Get phone numbers of those who are willing to help and who are committed to your recovery.  Attend support groups regularly for emotional support, advice, and guidance.  Do not use any products that contain nicotine or tobacco, such as cigarettes, e-cigarettes, and chewing tobacco. If you need help quitting, ask your health care provider.  Keep all follow-up visits as told by your health care provider. This is important. This includes work with therapists and support groups.      Where to find more information  [employer] on Drug Abuse: http://www.price-smith.com/  Substance Abuse and Mental Health Services Administration: SkateOasis.com.pt  Narcotics Anonymous: www.na.org Contact a health care provider if:  You cannot take your medicines as told.  You use cocaine again.  Your symptoms get worse. Get help right away if you have:  Serious thoughts about hurting yourself or others.  Chest pain.  Any symptoms of a stroke. "BE  FAST" is an easy way to remember the main warning signs of a stroke: ? B - Balance. Signs are dizziness, sudden trouble walking, or loss of balance. ? E - Eyes. Signs are trouble seeing or a sudden change in vision. ? F - Face. Signs are sudden weakness or numbness of the face, or the face or eyelid drooping on one side. ? A - Arms. Signs are weakness or numbness in an arm. This happens suddenly and usually on one side of the body. ? S - Speech. Signs are sudden trouble speaking, slurred speech, or trouble understanding what people say. ? T - Time. Time to call emergency services. Write down what time symptoms started.  Other signs of a stroke, such as: ? A sudden, severe headache with no known cause. ? Nausea or vomiting. ? Seizure. These symptoms may represent a serious problem that is an emergency. Do not wait to see if the symptoms will go away. Get medical help right away. Call your local emergency services (911 in the [place] Do not drive yourself to the hospital. If you ever feel like you may hurt yourself or others, or have thoughts about taking your own life, get help right away. Go to your nearest emergency department or:  Call your local emergency services (911 in the [place]  Call a suicide crisis helpline, such as the National Suicide Prevention Lifeline at (478) 724-0833. This is open 24 hours a day in the [place]  Text the Crisis Text Line at (530)026-6204 (in the [place] Summary  Cocaine has some medical uses, but these are rare. Cocaine is most often misused because of its effects.  Many people start using cocaine because it makes them feel good or helps them deal with their lives. Over time, they get addicted to it.  Treatment for this condition is usually provided by mental health professionals with training in substance use disorders. This information is not intended to replace advice given to you by your health care provider. Make sure you discuss any questions you have with your  health care provider. Document Revised: 07/03/2019 Document Reviewed: 07/03/2019 Elsevier Patient Education  2021 [employer].

## 2021-01-10 NOTE — Progress Notes (Incomplete)
   Subjective/Interm history:  Spontaneously converted back to sinus rhythm overnight. Used BiPAP for a few hours overnight  Objective:  Vital signs in last 24 hours: Vitals:   01/09/21 2244 01/10/21 0000 01/10/21 0030 01/10/21 0501  BP:  (!) 157/101  (!) 159/77  Pulse: 82 86  76  Resp: 20 17  20   Temp:  98.3 F (36.8 C)  98.4 F (36.9 C)  TempSrc:  Oral  Oral  SpO2: 100% 98%  99%  Weight:   (!) 163.8 kg   Height:       General: morbidly obese, chronically ill appearing, sitting in a chair Cardiac: regular rate, irregular rhythm,  Pulm: several episodes of apnea while sitting in the chair sleeping. Not on supplemental oxygen. O2 sat 96%. Diminished lung sounds  Assessment/Plan:  Principal Problem:   Atrial fibrillation with RVR (HCC) Active Problems:   Morbid obesity with BMI of 50.0-59.9, adult (HCC)   Acute on chronic diastolic CHF (congestive heart failure), NYHA class 3 (HCC)   Obesity hypoventilation syndrome (HCC)   Polysubstance abuse (HCC)   Acute respiratory failure (HCC)  55 year old male with diastolic heart failure due to NICM, chronic respiratory failure (on 4L), atrial fibrillation, and cocaine use disorder who was admitted to [facility] 01/08/21 for acute respiratory failure secondary to afib with RVR in the setting of recent cocaine use.  Persistent atrial fibrillation with RVR (resolved). secondary to untreated OSA/OHS and cocaine abuse along with suspected medication non-compliance.  spontaneously converted back to sinus rhythm last night. Chronic CHF  Management per cardiology -starting cardizem 90mg  q6h with plans to transition to CD tomorrow -demadex restarted at decreased dose of 20mg  BID today (on 60 at home) -continue [facility] with xarelto -will follow up with cardiology after discharge and, if he can be compliant with xarelto, will consider cardioversion after 3 weeks of therapeutic AC -avoid beta-blockade in setting of cocaine use  Untreated OSA/OHS,  metabolic encephalopathy Will need close follow up with PCP as they continue to work on getting the trilogy machine for him.  -encourage BiPAP use qHS  #Substance use disorder. Cocaine + on UDS. Recurrent issue that is likely driving readmissions Non-adherence with medications, recommendations. Long term prognosis remains poor if he does not start working on these things. Lengthy discussion held last admission regarding this. Mental status precluding revisiting this so far on this admission.  #Type 2 DM. Resume metformin  #AKI resolved.  Best practice Code: full VTE prophylaxis: xarelto Disposition: pending rate control and resolution of encephalopathy  , MD Internal Medicine Resident PGY-2 Internal Medicine Residency Pager: [phone] After 5pm on weekdays and 1pm on weekends: On Call pager [phone] 01/10/2021 7:51 AM

## 2021-01-11 ENCOUNTER — Telehealth: Payer: Self-pay | Admitting: *Deleted

## 2021-01-11 ENCOUNTER — Other Ambulatory Visit: Payer: Self-pay | Admitting: Nurse Practitioner

## 2021-01-11 ENCOUNTER — Telehealth: Payer: Self-pay

## 2021-01-11 ENCOUNTER — Other Ambulatory Visit (HOSPITAL_COMMUNITY): Payer: Self-pay | Admitting: Internal Medicine

## 2021-01-11 ENCOUNTER — Telehealth (HOSPITAL_COMMUNITY): Payer: Self-pay | Admitting: Licensed Clinical Social Worker

## 2021-01-11 ENCOUNTER — Other Ambulatory Visit (HOSPITAL_COMMUNITY): Payer: Self-pay | Admitting: *Deleted

## 2021-01-11 ENCOUNTER — Other Ambulatory Visit (HOSPITAL_COMMUNITY): Payer: Self-pay | Admitting: Cardiology

## 2021-01-11 ENCOUNTER — Telehealth (HOSPITAL_COMMUNITY): Payer: Self-pay

## 2021-01-11 DIAGNOSIS — I5032 Chronic diastolic (congestive) heart failure: Secondary | ICD-10-CM

## 2021-01-11 MED ORDER — LOSARTAN POTASSIUM 50 MG PO TABS: 50.0000 mg | ORAL_TABLET | Freq: Every day | ORAL | 3 refills | Status: DC

## 2021-01-11 NOTE — Telephone Encounter (Signed)
Transition Care Management Follow-up Telephone Call  Date of discharge and from where: 01/10/2021 - [facility]  How have you been since you were released from the hospital? "I don't feel good"  Any questions or concerns? No  Items Reviewed:  Did the pt receive and understand the discharge instructions provided? Yes   Medications obtained and verified? Yes   Other? No   Any new allergies since your discharge? No   Dietary orders reviewed? No  Do you have support at home? No    Functional Questionnaire: (I = Independent and D = Dependent) ADLs: I  Bathing/Dressing- I  Meal Prep- I  Eating- I  Maintaining continence- I  Transferring/Ambulation- I  Managing Meds- I  Follow up appointments reviewed:   PCP Hospital f/u appt confirmed? Yes  Scheduled to see [name], NP on 02/01/2021 @ 1050.  Specialist Hospital f/u appt confirmed? Yes  Scheduled to see Cardiology on 01/21/2021 @ 1130.  Are transportation arrangements needed? No   If their condition worsens, is the pt aware to call PCP or go to the Emergency Dept.? Yes  Was the patient provided with contact information for the PCP's office or ED? Yes  Was to pt encouraged to call back with questions or concerns? Yes

## 2021-01-11 NOTE — Telephone Encounter (Signed)
Transition Care Management Follow-up Telephone Call  Date of discharge and from where: [facility] on 01/10/2021 Pt had a call earlier today from [place], [place] .  TOC  was  done.   Follow up appointments reviewed:  Pt has scheduled HFU with PCP and cardiologist at this time. PCP Hospital f/u appt confirmed?   Scheduled to see [name], NP on 02/01/2021 @ 1050.  Specialist Hospital f/u appt confirmed? Scheduled to see Cardiology on 01/21/2021 @ 1130.

## 2021-01-11 NOTE — Telephone Encounter (Signed)
CSW spoke with pt this morning to check in.  Pt doing ok- going to his outpatient rehab session at [facility] this morning and continues to express commitment to attending.  Pt not feeling well overall- feels like he has full body fatigue/pain due to not having breathing equipment for sleep.  CSW spoke with rep at [facility]- they are continuing efforts to get in touch with [employer] to discuss appeal.  Will continue to follow and assist as needed  [name], LCSW Clinical Social Worker [facility] Desk#: [phone] Cell#: [phone]

## 2021-01-11 NOTE — Telephone Encounter (Signed)
Left message for Benjamin [name] to advise him, his bubble packs with updated medications would be ready tomorrow and I would pick them up and deliver them for him. No answer upon calling multiple times today.    He had left a message for [name] earlier today reporting his phone would be getting turned off due to lack of payment.    I will pick up medicines and delivering them tomorrow.

## 2021-01-12 ENCOUNTER — Other Ambulatory Visit (HOSPITAL_COMMUNITY): Payer: Self-pay | Admitting: Cardiology

## 2021-01-12 ENCOUNTER — Telehealth (HOSPITAL_COMMUNITY): Payer: Self-pay

## 2021-01-12 NOTE — Telephone Encounter (Signed)
Spoke to [place] who reports he is in [place] today. I will drop of bubble packs tomorrow at 4:00. Safir agreed. Call complete.

## 2021-01-13 ENCOUNTER — Other Ambulatory Visit (HOSPITAL_COMMUNITY): Payer: Self-pay

## 2021-01-14 ENCOUNTER — Telehealth (HOSPITAL_COMMUNITY): Payer: Self-pay | Admitting: Licensed Clinical Social Worker

## 2021-01-14 ENCOUNTER — Encounter (HOSPITAL_COMMUNITY): Payer: Medicaid Other

## 2021-01-14 NOTE — Telephone Encounter (Signed)
Received call from [employer] who states that official denial for pt trilogy vent was received.  They will need to work on appeal with an MD- CSW informed rep that pt pulmonologist is Dr. Craige Cotta and  PCP is [name], NP.  They will work on working on appeal and keep CSW updated as needed  [name], LCSW Clinical Social Worker [facility] Desk#: [phone] Cell#: [phone]

## 2021-01-14 NOTE — Progress Notes (Signed)
Paramedicine Encounter    Patient ID: Benjamin [name], male    DOB: 04-14-1966, 55 y.o.   MRN: [id number]   Met with Benjamin [name] and delivered his bubble packs and potassium to him. I reviewed updated medications with him and he agreed and understood changes. Benjamin [name] reports he took a drug test this morning by his parole officer and passed. Benjamin [name] denies any present shortness of breath, chest pain or other symptoms. Visit complete. Joellen Jersey will follow up.     ACTION: Home visit completed

## 2021-01-20 NOTE — Progress Notes (Signed)
[facility] Note   PCP: Benjamin Pounds, NP PCP-Cardiologist: Benjamin Carnes, MD  [facility]: Dr. Aundra [name]   HPI: Mr.Williamsis a 55 yo with hx of diastolic CHF, HTN, DM, Atrial fib, PE in April 2020 (on Xarelto), OSA, GERD, chronic elevation of troponin.  Admitted 01/21/2019 with increased dyspnea and had PE. He was anticoagulated.   Patient re-admitted 5/20 for syncope/cough/dyspnea, COVID+, CT positive for bilateral GGOs, admitted to [facility] and discharged after 6 days, on O2. Rec'd Cards f/u.  Patient re-admitted 02/20/19 for bright red blood per rectum, eval'ed by GI, had EGD/colon, dx'd with hemorrhoidal bleeding, discharged after 9 days on O2.He was again Covid + .   Readmitted 03/11/19 with increased shortness of breath. He did not have home oxygen. He was discharged to home the next day.  He presented to [facility] ED7/23/20with lower extremity edema. COVID negative. Diuresed with lasix drip and transitioned to torsemide 40 mg twice daily. Had [place] with mild volume overload and preserved cardiac output. Discharge weight was 407.9 [name].   He was readmitted with symptomatic anemia 05/28/19 with hgb 7.1. Received 1UPRBCs. GI consulted. He started on anusol and continued on stool softner.  He was readmitted to the hospital on 07/16/19 when he presented to the ED w/ worsening symptoms and was admitted by IM for a/c diastolic CHF and treated w/ IV Lasix. Had 2 day hospital stay and was discharged on 10/29. Echo was done 10/28 and showed normal LVEF 65-70%, RV interpreted as normal but suspect significant RV dysfunction.  After diuresis w/ IV Lasix, he was placed back on home diuretic regimen, torsemide 100 mg bid. Lisinopril 5 mg was also added to regimen. AHF team was not consulted that admit.  He was seen back in clinic 07/30/19. Felt poorly. SOB at rest and worse w/ exertion. Also w/ orthopnea/PND. Significant wt gain of 20 lb up to 428 lb (dry wt ~407 lb), w/ marked abdominal  distention and LEE and poor response to IV diuretics.  Once diuresed transitioned to torsemide 100 mg twice a day. D/C 408 [name].   Zio patch on 10/20/19-No arrhythmia.   Re-admitted 08/19/20-08/24/20 for  A fib RVR. A. fib RVR in the setting of noncompliance ethanol and cocaine. Cardizem started and labetalol continued. 12/3 TEE (EF 60-65%) with DCCV successfully converted to SR. Diuresed with IV lasix and had AKI in setting of over diuresis. SCr 1.59 on admit, bumped to 2.5. Diuretics held + IVF hydration.   Admitted 12/24/20 with hypertensive emergency and respiratory failure. .   Admitted 422/22 with A Fib RVR & AMS. Diltiazem increased to 360 mg daily. Chemically converted to NSR.  + Cocaine 01/08/21.   Today he returns for HF follow up.Overall feeling fine. SOB with exertion. Not using CPAP b/c machine isnt working. Denies PND/Orthopnea. Appetite ok. No fever or chills. Weight at home 355 [name]. Says he hasn't used cocaine since 01/08/21. Taking all medications. Followed by HF Paramedicine.    Labs (11/20): BNP 32, K 3.6, creatinine 1.09  Labs (12/20): K 3.3, creatinine 1.28 Labs (10/10/19): K 3.9 Creatinine 1.4  Labs (11/05/19) : K 3.2 Creatinine 1.47  Labs (12/30/2019) : K 3.5 Creatinine 1.14  Labs (03/02/2020): K 3.8 Creatinine 1.09.  Labs (12/21): K 4.0, Creatinine 1.17, Magnesium 1.6 Labs (09/15/20): K 3.1, Creatinine 2.54 Labs (1/22): K 3.1, creatinine 1.56   Review of Systems: All systems reviewed and negative except as per HPI.   PMH: 1. Atrial fibrillation: Paroxysmal.  - DCCV (08/21/20): converted to  SR 2. Pulmonary embolus: 5/20.  3. OHS/OSA: He is on home oxygen during the day and uses CPAP at night.  4. Morbid obesity.  5. Chronic diastolic CHF:  - RHC (3/71): mean RA 12, PA 40/25 mean 31, mean PCWP 23, CI 2.47, PVR 1.1 WU.  - Echo (10/20): EF 65-70%, mild LVH, normal RV size and systolic function.  - TEE (12/21): EF 60-65% 6. Type 2 diabetes 7. HTN 8. Rectal bleeding:  ?Hemorrhoidal.  9. COVID-19 infection 7/20.  10. ZIo Patch - 10/20/19 no arrhythmias  NST 08/23/20  There was no ST segment deviation noted during stress.  The left ventricular ejection fraction is moderately decreased (30-44%).  Nuclear stress EF: 43%.  This is a low risk study.  The study is normal.  Fixed apical inferior perfusion defect with normal wall motion in this area, consistent with artifact  Current Outpatient Medications  Medication Sig Dispense Refill  . atorvastatin (LIPITOR) 40 MG tablet Take 1 tablet (40 mg total) by mouth at bedtime. 90 tablet 2  . Blood Glucose Monitoring Suppl (TRUE METRIX METER) w/Device KIT Use as instructed. Check blood glucose level by fingerstick twice per day. 1 kit 0  . Blood Pressure Monitor DEVI Please provide patient with insurance approved blood pressure device with L-XL cuff. BMI 55 (Patient taking differently: 1 each by Other route See admin instructions. Please provide patient with insurance approved blood pressure device with L-XL cuff. BMI 55) 1 each 0  . carboxymethylcellulose (REFRESH PLUS) 0.5 % SOLN Place 1 drop into both eyes daily.    Marland Kitchen diltiazem (CARDIZEM CD) 360 MG 24 hr capsule Take 1 capsule (360 mg total) by mouth daily. 30 capsule 0  . docusate sodium (COLACE) 100 MG capsule Take 100 mg by mouth 2 (two) times daily.    Marland Kitchen doxazosin (CARDURA) 2 MG tablet Take 1 tablet (2 mg total) by mouth daily. 30 tablet 11  . ferrous sulfate 325 (65 FE) MG tablet Take 1 tablet (325 mg total) by mouth daily. 90 tablet 2  . glucose blood (TRUE METRIX BLOOD GLUCOSE TEST) test strip Use as instructed. Check blood glucose level by fingerstick twice per day. 100 each 12  . hydrALAZINE (APRESOLINE) 50 MG tablet TAKE ONE TABLET BY MOUTH THREE TIMES A DAY. (NOON+PM+BEDTIME) 90 tablet 0  . losartan (COZAAR) 50 MG tablet Take 1 tablet (50 mg total) by mouth daily. 90 tablet 3  . Lumateperone Tosylate (CAPLYTA) 42 MG CAPS Take 42 mg by mouth at  bedtime. 30 capsule   . magnesium oxide (MAG-OX) 400 (241.3 Mg) MG tablet TAKE 2 TABLETS BY MOUTH EVERY MORNING AND 1 TABLET EVERY EVENING NOON+PM+BEDTIME) (Patient taking differently: Take 400 mg by mouth 3 (three) times daily.) 90 tablet 3  . metFORMIN (GLUCOPHAGE) 500 MG tablet TAKE ONE TABLET BY MOUTH TWICE DAILY WITH A MEAL (NOON+BEDTIME) (Patient taking differently: Take 500 mg by mouth 2 (two) times daily with a meal.) 180 tablet 0  . Misc. Devices MISC Requires O2 @ 3L/min continuously via nasal canula and home fill system,. (Patient taking differently: 1 each by Other route See admin instructions. Requires O2 @ 3L/min continuously via nasal canula and home fill system,.) 1 each 0  . Misc. Devices MISC Please provide Mr. Faulkner with insurance approved portable O2 concentrator ICD 10 J96.11 [phone] (Patient taking differently: 1 each by Other route See admin instructions. Please provide Mr. Sanjuan with insurance approved portable O2 concentrator ICD 10 J96.11 Z99.81) 1 each 0  .  Misc. Devices MISC Please provide patient with insurance approved LARGE right hand splint ICD 10 G56.01 1 each 0  . Misc. Devices MISC Please provide patient wit insurance approved diabetic custom molded shoes (QTY 2) and custom molded multi density inserts (1 for each shoe). 1 each 0  . omeprazole (PRILOSEC) 20 MG capsule TAKE 1 CAPSULE BY MOUTH 2 (TWO) TIMES DAILY BEFORE A MEAL. TAKE FIRST CAPSULE 30 MIN PRIOR TO EATING OR TAKING OTHER MEDICATIONS. (AM+BEDTIME) (Patient taking differently: Take 20 mg by mouth 2 (two) times daily before a meal.) 180 capsule 2  . potassium chloride (KLOR-CON) 10 MEQ tablet Take 8 tablets (80 mEq total) by mouth daily. 240 tablet 3  . PROVENTIL HFA 108 (90 Base) MCG/ACT inhaler INHALE 2 PUFFS BY MOUTH EVERY 6 (SIX) HOURS AS NEEDED FOR WHEEZING OR SHORTNESS OF BREATH. (Patient taking differently: Inhale 2 puffs into the lungs every 6 (six) hours as needed for wheezing or shortness of  breath.) 18 g 6  . spironolactone (ALDACTONE) 50 MG tablet TAKE 1 TABLET (50 MG TOTAL) BY MOUTH DAILY (NOON) 30 tablet 11  . torsemide (DEMADEX) 20 MG tablet Take 3 tablets (60 mg total) by mouth 2 (two) times daily. 180 tablet 3  . TRUEplus Lancets 28G MISC Use as instructed. Check blood glucose level by fingerstick twice per day. 200 each 3  . XARELTO 20 MG TABS tablet TAKE 1 TABLET BY MOUTH DAILY WITH SUPPER (BEDTIME) (Patient taking differently: Take 20 mg by mouth daily with supper.) 60 tablet 6   No current facility-administered medications for this encounter.    No Known Allergies    Social History   Socioeconomic History  . Marital status: Single    Spouse name: Not on file  . Number of children: Not on file  . Years of education: Not on file  . Highest education level: Not on file  Occupational History  . Not on file  Tobacco Use  . Smoking status: Former Research scientist (life sciences)  . Smokeless tobacco: Never Used  . Tobacco comment: smoked weed in the past  Vaping Use  . Vaping Use: Never used  Substance and Sexual Activity  . Alcohol use: Not Currently  . Drug use: Yes    Types: Marijuana, Cocaine  . Sexual activity: Not Currently  Other Topics Concern  . Not on file  Social History Narrative  . Not on file   Social Determinants of Health   Financial Resource Strain: Not on file  Food Insecurity: Not on file  Transportation Needs: Not on file  Physical Activity: Not on file  Stress: Not on file  Social Connections: Not on file  Intimate Partner Violence: Not on file     Family History  Problem Relation Age of Onset  . Hypertension Mother   . Diabetes Mother     Vitals:   01/21/21 1140  BP: 122/78  Pulse: (!) 105  SpO2: 95%  Weight: (!) 161.3 kg (355 lb 9.6 oz)   Wt Readings from Last 3 Encounters:  01/21/21 (!) 161.3 kg (355 lb 9.6 oz)  01/10/21 (!) 163.8 kg (361 lb 3.2 oz)  12/25/20 (!) 160.5 kg (353 lb 13.4 oz)   PHYSICAL EXAM: General:  Well appearing. No  resp difficulty HEENT: normal Neck: supple. no JVD. Carotids 2+ bilat; no bruits. No lymphadenopathy or thryomegaly appreciated. Cor: PMI nondisplaced. Regular rate & rhythm. No rubs, gallops or murmurs. Lungs: clear on room air.  Abdomen: obese, soft, nontender, nondistended. No hepatosplenomegaly. No bruits  or masses. Good bowel sounds. Extremities: no cyanosis, clubbing, rash, edema Neuro: alert & orientedx3, cranial nerves grossly intact. moves all 4 extremities w/o difficulty. Affect pleasant  EKG: SR 86 bpm personally reviewed.   ASSESSMENT & PLAN:  1. Chronic diastolic CHF: Echo in 29/84 with EF 65-70%, RV interpreted as normal but suspect significant RV dysfunction.  -NYHA III. Volume status stable. Continue current regimen.  - Continue torsemide 60 mg bid  - Continue KCl 60 mEq daily. - Continue spironolactone to 50 mg daily.  - Losartan stopped (elevated SCr).  2. Atrial fibrillation: Paroxysmal.  In SR today.  - Continue current dose of diltiazem.  - Continue Xarelto.   3. H/o PE: 5/20, continue Xarelto.   4. OHS/OSA:  Not using CPAP. Machine is not working.  - Continue home O2.  5. HTN: Stable - Continue current regimen.   6. DM - Per PCP. - hgb a1c 6.1 (12/21).  7. Obesity Body mass index is 49.6 kg/m.   Follow up in 4-6 weeks. Discussed with HF Paramedic.  Darrick Grinder, NP-C  01/21/2021 11:43 AM

## 2021-01-21 ENCOUNTER — Other Ambulatory Visit: Payer: Self-pay

## 2021-01-21 ENCOUNTER — Other Ambulatory Visit (HOSPITAL_COMMUNITY): Payer: Self-pay

## 2021-01-21 ENCOUNTER — Ambulatory Visit (HOSPITAL_COMMUNITY)
Admission: RE | Admit: 2021-01-21 | Discharge: 2021-01-21 | Disposition: A | Discharge status: Home / Self Care | Payer: Medicaid Other | Source: Ambulatory Visit | Attending: Adult Health | Admitting: Adult Health

## 2021-01-21 ENCOUNTER — Encounter (HOSPITAL_COMMUNITY): Payer: Self-pay

## 2021-01-21 VITALS — BP 122/78 | HR 86 | Wt 355.6 lb

## 2021-01-21 DIAGNOSIS — E119 Type 2 diabetes mellitus without complications: Secondary | ICD-10-CM | POA: Insufficient documentation

## 2021-01-21 DIAGNOSIS — G4733 Obstructive sleep apnea (adult) (pediatric): Secondary | ICD-10-CM | POA: Diagnosis not present

## 2021-01-21 DIAGNOSIS — Z7984 Long term (current) use of oral hypoglycemic drugs: Secondary | ICD-10-CM | POA: Insufficient documentation

## 2021-01-21 DIAGNOSIS — K219 Gastro-esophageal reflux disease without esophagitis: Secondary | ICD-10-CM | POA: Diagnosis not present

## 2021-01-21 DIAGNOSIS — Z87891 Personal history of nicotine dependence: Secondary | ICD-10-CM | POA: Insufficient documentation

## 2021-01-21 DIAGNOSIS — I480 Paroxysmal atrial fibrillation: Secondary | ICD-10-CM | POA: Insufficient documentation

## 2021-01-21 DIAGNOSIS — Z79899 Other long term (current) drug therapy: Secondary | ICD-10-CM | POA: Insufficient documentation

## 2021-01-21 DIAGNOSIS — Z86711 Personal history of pulmonary embolism: Secondary | ICD-10-CM | POA: Insufficient documentation

## 2021-01-21 DIAGNOSIS — Z8616 Personal history of COVID-19: Secondary | ICD-10-CM | POA: Insufficient documentation

## 2021-01-21 DIAGNOSIS — I10 Essential (primary) hypertension: Secondary | ICD-10-CM | POA: Diagnosis not present

## 2021-01-21 DIAGNOSIS — I509 Heart failure, unspecified: Secondary | ICD-10-CM | POA: Diagnosis not present

## 2021-01-21 DIAGNOSIS — Z8249 Family history of ischemic heart disease and other diseases of the circulatory system: Secondary | ICD-10-CM | POA: Diagnosis not present

## 2021-01-21 DIAGNOSIS — Z7901 Long term (current) use of anticoagulants: Secondary | ICD-10-CM | POA: Diagnosis not present

## 2021-01-21 DIAGNOSIS — Z6841 Body Mass Index (BMI) 40.0 and over, adult: Secondary | ICD-10-CM | POA: Insufficient documentation

## 2021-01-21 DIAGNOSIS — Z833 Family history of diabetes mellitus: Secondary | ICD-10-CM | POA: Diagnosis not present

## 2021-01-21 DIAGNOSIS — I110 Hypertensive heart disease with heart failure: Secondary | ICD-10-CM | POA: Insufficient documentation

## 2021-01-21 DIAGNOSIS — I5032 Chronic diastolic (congestive) heart failure: Secondary | ICD-10-CM | POA: Diagnosis not present

## 2021-01-21 DIAGNOSIS — J9611 Chronic respiratory failure with hypoxia: Secondary | ICD-10-CM | POA: Diagnosis not present

## 2021-01-21 LAB — BASIC METABOLIC PANEL
Anion gap: 7 (ref 5–15)
BUN: 20 mg/dL (ref 6–20)
CO2: 31 mmol/L (ref 22–32)
Calcium: 9 mg/dL (ref 8.9–10.3)
Chloride: 98 mmol/L (ref 98–111)
Creatinine, Ser: 1.89 mg/dL — ABNORMAL HIGH (ref 0.61–1.24)
GFR, Estimated: 41 mL/min — ABNORMAL LOW (ref >60)
Glucose, Bld: 108 mg/dL — ABNORMAL HIGH (ref 70–99)
Potassium: 3.6 mmol/L (ref 3.5–5.1)
Sodium: 136 mmol/L (ref 135–145)

## 2021-01-21 NOTE — Patient Instructions (Signed)
It was great to see you today! No medication changes are needed at this time.   Labs today We will only contact you if something comes back abnormal or we need to make some changes. Otherwise no news is good news!  Your physician recommends that you schedule a follow-up appointment in: 4-6 weeks  Do the following things EVERYDAY: 1) Weigh yourself in the morning before breakfast. Write it down and keep it in a log. 2) Take your medicines as prescribed 3) Eat low salt foods--Limit salt (sodium) to 2000 mg per day.  4) Stay as active as you can everyday 5) Limit all fluids for the day to less than 2 liters  At the [facility], you and your health needs are our priority. As part of our continuing mission to provide you with exceptional heart care, we have created designated Provider Care Teams. These Care Teams include your primary Cardiologist (physician) and Advanced Practice Providers (APPs- Physician Assistants and Nurse Practitioners) who all work together to provide you with the care you need, when you need it.   You may see any of the following providers on your designated Care Team at your next follow up: Marland Kitchen Dr Arvilla Meres . Dr Marca Ancona . Dr Thornell Mule . Tonye Becket, NP . Robbie Lis, PA . Shanda Bumps [place],NP . Karle Plumber, PharmD   Please be sure to bring in all your medications bottles to every appointment.    If you have any questions or concerns before your next appointment please send [place] a message through [place] or call our office at [phone].    TO LEAVE A MESSAGE FOR THE NURSE SELECT OPTION 2, PLEASE LEAVE A MESSAGE INCLUDING: . YOUR NAME . DATE OF BIRTH . CALL BACK NUMBER . REASON FOR CALL**this is important as we prioritize the call backs  YOU WILL RECEIVE A CALL BACK THE SAME DAY AS LONG AS YOU CALL BEFORE 4:00 PM

## 2021-01-21 NOTE — Progress Notes (Signed)
Paramedicine Encounter   Patient ID: Benjamin [name] , male,   DOB: 12/04/1965,55 y.o.,  MRN: [id number]   Met patient in clinic today with provider.  Weight @ clinic-355 B/p-122/78 p-105 sp02-95  Pt reports he has passed the last 3 drug tests since he has been out of the hosp. He states the last scare in the hosp put him straight-he does not want to go back using anymore.  He has bubble packs-just delivered last week when he was released home from hosp with the correct med changes.  Will f/u next week.   Next Foot appoint-5/12 Thursday @ [place], [place] 01/21/2021

## 2021-01-26 ENCOUNTER — Other Ambulatory Visit (HOSPITAL_COMMUNITY): Payer: Self-pay

## 2021-01-26 DIAGNOSIS — Z634 Disappearance and death of family member: Secondary | ICD-10-CM | POA: Diagnosis not present

## 2021-01-26 NOTE — Progress Notes (Signed)
Paramedicine Encounter    Patient ID: Benjamin [name], male    DOB: 11-30-1965, 55 y.o.   MRN: [id number]   Patient Care Team: [name], NP as PCP - General (Nurse Practitioner) [name], MD as PCP - Cardiology (Cardiology) [name], LCSW as Social Worker (Licensed Clinical Social Worker)  Patient Active Problem List   Diagnosis Date Noted  . Atrial fibrillation with RVR ([place]) 01/08/2021  . Acute respiratory failure ([place]) 12/24/2020  . Obstructive sleep apnea treated with BiPAP 12/24/2020  . Polysubstance abuse ([place]) 08/20/2020  . Alcohol abuse 08/20/2020  . Atrial fibrillation with rapid ventricular response ([place]) 08/19/2020  . Obesity hypoventilation syndrome ([place]) 06/12/2020  . Healthcare maintenance 06/12/2020  . Acute on chronic diastolic heart failure ([place]) 02/24/2020  . Diabetic neuropathy ([place]) 11/20/2019  . Coagulation disorder ([place]) 11/20/2019  . Acute on chronic diastolic CHF (congestive heart failure), NYHA class 3 ([place]) 07/30/2019  . Diabetes mellitus without complication ([place]) 20/94/7096  . Hypertensive emergency   . BRBPR (bright red blood per rectum) 06/12/2019  . Hypertension   . CHF (congestive heart failure) ([place])   . Sleep apnea   . Chronic respiratory failure ([place])   . Hypokalemia 05/29/2019  . GI bleed 05/29/2019  . Anemia 05/28/2019  . Iron deficiency anemia 03/11/2019  . OSA on CPAP 03/11/2019  . HLD (hyperlipidemia) 03/11/2019  . Elevated troponin 03/11/2019  . GERD (gastroesophageal reflux disease) 03/11/2019  . Rectal bleeding 02/20/2019  . Dyspnea 02/06/2019  . COVID-19 virus infection 02/06/2019  . Bilateral lower extremity edema   . Morbid obesity with BMI of 50.0-59.9, adult ([place])   . PAF (paroxysmal atrial fibrillation) ([place])   . PE (pulmonary thromboembolism) ([place]) 01/21/2019  . Hypertensive urgency 01/21/2019  . Diabetes mellitus type 2 in obese ([place]) 01/21/2019    Current Outpatient Medications:  .  atorvastatin  (LIPITOR) 40 MG tablet, Take 1 tablet (40 mg total) by mouth at bedtime., Disp: 90 tablet, Rfl: 2 .  Blood Glucose Monitoring Suppl (TRUE METRIX METER) w/Device KIT, Use as instructed. Check blood glucose level by fingerstick twice per day., Disp: 1 kit, Rfl: 0 .  Blood Pressure Monitor DEVI, Please provide patient with insurance approved blood pressure device with L-XL cuff. BMI 55 (Patient taking differently: 1 each by Other route See admin instructions. Please provide patient with insurance approved blood pressure device with L-XL cuff. BMI 55), Disp: 1 each, Rfl: 0 .  carboxymethylcellulose (REFRESH PLUS) 0.5 % SOLN, Place 1 drop into both eyes daily., Disp: , Rfl:  .  diltiazem (CARDIZEM CD) 360 MG 24 hr capsule, Take 1 capsule (360 mg total) by mouth daily., Disp: 30 capsule, Rfl: 0 .  docusate sodium (COLACE) 100 MG capsule, Take 100 mg by mouth 2 (two) times daily., Disp: , Rfl:  .  doxazosin (CARDURA) 2 MG tablet, Take 1 tablet (2 mg total) by mouth daily., Disp: 30 tablet, Rfl: 11 .  ferrous sulfate 325 (65 FE) MG tablet, Take 1 tablet (325 mg total) by mouth daily., Disp: 90 tablet, Rfl: 2 .  glucose blood (TRUE METRIX BLOOD GLUCOSE TEST) test strip, Use as instructed. Check blood glucose level by fingerstick twice per day., Disp: 100 each, Rfl: 12 .  hydrALAZINE (APRESOLINE) 50 MG tablet, TAKE ONE TABLET BY MOUTH THREE TIMES A DAY. (NOON+PM+BEDTIME), Disp: 90 tablet, Rfl: 0 .  losartan (COZAAR) 50 MG tablet, Take 1 tablet (50 mg total) by mouth daily., Disp: 90 tablet, Rfl: 3 .  Lumateperone Tosylate (CAPLYTA) 42 MG CAPS, Take 42 mg by mouth at bedtime., Disp: 30 capsule, Rfl:  .  magnesium oxide (MAG-OX) 400 (241.3 Mg) MG tablet, TAKE 2 TABLETS BY MOUTH EVERY MORNING AND 1 TABLET EVERY EVENING NOON+PM+BEDTIME) (Patient taking differently: Take 400 mg by mouth 3 (three) times daily.), Disp: 90 tablet, Rfl: 3 .  metFORMIN (GLUCOPHAGE) 500 MG tablet, TAKE ONE TABLET BY MOUTH TWICE DAILY WITH A  MEAL (NOON+BEDTIME) (Patient taking differently: Take 500 mg by mouth 2 (two) times daily with a meal.), Disp: 180 tablet, Rfl: 0 .  Misc. Devices MISC, Requires O2 @ 3L/min continuously via nasal canula and home fill system,. (Patient taking differently: 1 each by Other route See admin instructions. Requires O2 @ 3L/min continuously via nasal canula and home fill system,.), Disp: 1 each, Rfl: 0 .  Misc. Devices MISC, Please provide Mr. Pannone with insurance approved portable O2 concentrator ICD 10 J96.11 [phone] (Patient taking differently: 1 each by Other route See admin instructions. Please provide Mr. Hawes with insurance approved portable O2 concentrator ICD 10 J96.11 [id number]), Disp: 1 each, Rfl: 0 .  Misc. Devices MISC, Please provide patient with insurance approved LARGE right hand splint ICD 10 G56.01, Disp: 1 each, Rfl: 0 .  Misc. Devices MISC, Please provide patient wit insurance approved diabetic custom molded shoes (QTY 2) and custom molded multi density inserts (1 for each shoe)., Disp: 1 each, Rfl: 0 .  omeprazole (PRILOSEC) 20 MG capsule, TAKE 1 CAPSULE BY MOUTH 2 (TWO) TIMES DAILY BEFORE A MEAL. TAKE FIRST CAPSULE 30 MIN PRIOR TO EATING OR TAKING OTHER MEDICATIONS. (AM+BEDTIME) (Patient taking differently: Take 20 mg by mouth 2 (two) times daily before a meal.), Disp: 180 capsule, Rfl: 2 .  potassium chloride (KLOR-CON) 10 MEQ tablet, Take 8 tablets (80 mEq total) by mouth daily., Disp: 240 tablet, Rfl: 3 .  PROVENTIL HFA 108 (90 Base) MCG/ACT inhaler, INHALE 2 PUFFS BY MOUTH EVERY 6 (SIX) HOURS AS NEEDED FOR WHEEZING OR SHORTNESS OF BREATH. (Patient taking differently: Inhale 2 puffs into the lungs every 6 (six) hours as needed for wheezing or shortness of breath.), Disp: 18 g, Rfl: 6 .  spironolactone (ALDACTONE) 50 MG tablet, TAKE 1 TABLET (50 MG TOTAL) BY MOUTH DAILY (NOON), Disp: 30 tablet, Rfl: 11 .  torsemide (DEMADEX) 20 MG tablet, Take 3 tablets (60 mg total) by mouth 2 (two)  times daily., Disp: 180 tablet, Rfl: 3 .  TRUEplus Lancets 28G MISC, Use as instructed. Check blood glucose level by fingerstick twice per day., Disp: 200 each, Rfl: 3 .  XARELTO 20 MG TABS tablet, TAKE 1 TABLET BY MOUTH DAILY WITH SUPPER (BEDTIME) (Patient taking differently: Take 20 mg by mouth daily with supper.), Disp: 60 tablet, Rfl: 6 No Known Allergies    Social History   Socioeconomic History  . Marital status: Single    Spouse name: Not on file  . Number of children: Not on file  . Years of education: Not on file  . Highest education level: Not on file  Occupational History  . Not on file  Tobacco Use  . Smoking status: Former Research scientist (life sciences)  . Smokeless tobacco: Never Used  . Tobacco comment: smoked weed in the past  Vaping Use  . Vaping Use: Never used  Substance and Sexual Activity  . Alcohol use: Not Currently  . Drug use: Yes    Types: Marijuana, Cocaine  . Sexual activity: Not Currently  Other Topics Concern  . Not on  file  Social History Narrative  . Not on file   Social Determinants of Health   Financial Resource Strain: Not on file  Food Insecurity: Not on file  Transportation Needs: Not on file  Physical Activity: Not on file  Stress: Not on file  Social Connections: Not on file  Intimate Partner Violence: Not on file    Physical Exam      Future Appointments  Date Time Provider [place]  02/01/2021 10:50 AM [name], NP [facility] None    BP (!) 156/88   Pulse 94   Resp 20   SpO2 94%   Weight yesterday-355 Last visit weight-355  Pt just waking up when I arrived-he has not weighed this morning nor taken his meds this morning. Pt reports he is feeling good. He denies increased sob, denies dizziness, denies c/p.  No edema noted. Denies missing meds.  Denies issues or concerns at this time.  The paperwork from [facility] he wanted me to look at was a provider medical clearance for him to get his drivers license. I took that paperwork to  his PCP to get her to complete it.  If it gets turned in by next Friday 5/20 then good, if not then he will have to file an extension for that medical clearance.  Bubble packs checked and everything was good.    [name], [facility] Paramedic  01/26/21

## 2021-01-27 ENCOUNTER — Telehealth (HOSPITAL_COMMUNITY): Payer: Self-pay | Admitting: Licensed Clinical Social Worker

## 2021-01-27 NOTE — Telephone Encounter (Signed)
CSW called [employer] and spoke with Delia Chimes regarding pt ongoing PA request for Trilogy vent.  Melissa states that everything was re submitted and additional clinicals were faxed on 5/4 at [facility] request.  They are now awaiting further determination from Medicaid which could include a Peer to Peer review with pt PCP.  CSW will continue to follow and assist as needed  [name], LCSW Clinical Social Worker [facility] Desk#: [phone] Cell#: [phone]

## 2021-01-30 DIAGNOSIS — I5032 Chronic diastolic (congestive) heart failure: Secondary | ICD-10-CM | POA: Diagnosis not present

## 2021-01-30 DIAGNOSIS — J9611 Chronic respiratory failure with hypoxia: Secondary | ICD-10-CM | POA: Diagnosis not present

## 2021-02-01 ENCOUNTER — Ambulatory Visit: Payer: Medicaid Other | Attending: Nurse Practitioner | Admitting: Nurse Practitioner

## 2021-02-01 ENCOUNTER — Other Ambulatory Visit: Payer: Self-pay

## 2021-02-01 DIAGNOSIS — Z634 Disappearance and death of family member: Secondary | ICD-10-CM | POA: Diagnosis not present

## 2021-02-05 ENCOUNTER — Telehealth (HOSPITAL_COMMUNITY): Payer: Self-pay | Admitting: Licensed Clinical Social Worker

## 2021-02-05 DIAGNOSIS — Z634 Disappearance and death of family member: Secondary | ICD-10-CM | POA: Diagnosis not present

## 2021-02-05 NOTE — Telephone Encounter (Signed)
CSW called [employer] to check in regarding trilogy- they have submitted for auth for the 3rd time yesterday and are awaiting determination.  CSW will continue to follow and assist as needed  [name], LCSW Clinical Social Worker [facility] Desk#: [phone] Cell#: [phone]

## 2021-02-08 ENCOUNTER — Other Ambulatory Visit: Payer: Self-pay | Admitting: Internal Medicine

## 2021-02-08 ENCOUNTER — Encounter (HOSPITAL_COMMUNITY): Payer: Self-pay | Admitting: Cardiology

## 2021-02-08 ENCOUNTER — Other Ambulatory Visit (HOSPITAL_COMMUNITY): Payer: Self-pay | Admitting: Family Medicine

## 2021-02-08 ENCOUNTER — Other Ambulatory Visit (HOSPITAL_COMMUNITY): Payer: Self-pay | Admitting: Cardiology

## 2021-02-08 DIAGNOSIS — E876 Hypokalemia: Secondary | ICD-10-CM

## 2021-02-08 DIAGNOSIS — I10 Essential (primary) hypertension: Secondary | ICD-10-CM

## 2021-02-08 MED ORDER — DILTIAZEM HCL ER COATED BEADS 360 MG PO CP24: 360.0000 mg | ORAL_CAPSULE | Freq: Every day | ORAL | 3 refills | Status: DC

## 2021-02-09 DIAGNOSIS — Z634 Disappearance and death of family member: Secondary | ICD-10-CM | POA: Diagnosis not present

## 2021-02-10 ENCOUNTER — Telehealth (HOSPITAL_COMMUNITY): Payer: Self-pay | Admitting: Licensed Clinical Social Worker

## 2021-02-10 ENCOUNTER — Other Ambulatory Visit (HOSPITAL_COMMUNITY): Payer: Self-pay | Admitting: Cardiology

## 2021-02-10 DIAGNOSIS — E882 Lipomatosis, not elsewhere classified: Secondary | ICD-10-CM | POA: Diagnosis not present

## 2021-02-10 DIAGNOSIS — J9611 Chronic respiratory failure with hypoxia: Secondary | ICD-10-CM | POA: Diagnosis not present

## 2021-02-10 DIAGNOSIS — I5032 Chronic diastolic (congestive) heart failure: Secondary | ICD-10-CM

## 2021-02-10 NOTE — Progress Notes (Signed)
Orders placed for upcoming lab appt  

## 2021-02-10 NOTE — Telephone Encounter (Signed)
CSW received call from pt that he has been informed by [employer] that his triology vent was approved and they are coming out this afternoon with the equipment to get him set up.  No further needs at this time  [name], LCSW Clinical Social Worker [facility] Desk#: [phone] Cell#: [phone]

## 2021-02-11 ENCOUNTER — Other Ambulatory Visit (HOSPITAL_COMMUNITY): Payer: Medicaid Other

## 2021-02-12 ENCOUNTER — Telehealth (HOSPITAL_COMMUNITY): Payer: Self-pay | Admitting: Licensed Clinical Social Worker

## 2021-02-12 NOTE — Telephone Encounter (Signed)
Pt received letter in the mail from [facility] stating they needed the enclosed form signed and returned.  Pt signed and CSW emailed form to the the management site to keep pt on the waitlist.  Will continue to follow and assist as needed  [name], LCSW Clinical Social Worker [facility] Desk#: [phone] Cell#: [phone]

## 2021-02-21 DIAGNOSIS — I5032 Chronic diastolic (congestive) heart failure: Secondary | ICD-10-CM | POA: Diagnosis not present

## 2021-02-21 DIAGNOSIS — J9611 Chronic respiratory failure with hypoxia: Secondary | ICD-10-CM | POA: Diagnosis not present

## 2021-02-25 DIAGNOSIS — E1142 Type 2 diabetes mellitus with diabetic polyneuropathy: Secondary | ICD-10-CM | POA: Diagnosis not present

## 2021-03-03 ENCOUNTER — Telehealth (HOSPITAL_COMMUNITY): Payer: Self-pay

## 2021-03-03 DIAGNOSIS — Z634 Disappearance and death of family member: Secondary | ICD-10-CM | POA: Diagnosis not present

## 2021-03-03 NOTE — Telephone Encounter (Signed)
Pt contacted me regarding his CPAP machine is not working anymore, he isnt sure if theres a shortage in the wire-it keeps turning off and hes also getting an error code that something is clogged.  He did call the company to report this. I will f/u tomor.   Kerry Hough, EMT-Paramedic 03/02/2021

## 2021-03-03 NOTE — Telephone Encounter (Signed)
I called [employer] today to ensure his issues was reported to the correctly.  pt said he called somebody trilogy and they would contact the company that sent it out.  Today I called [employer] for f/u. They will get it to the team to trouble shoot this issue and contact him back.   Kerry Hough, EMT-Paramedic  03/03/21

## 2021-03-11 ENCOUNTER — Telehealth (HOSPITAL_COMMUNITY): Payer: Self-pay

## 2021-03-11 NOTE — Telephone Encounter (Signed)
Called and texted pt to see what time I could go out today or tomor. No answer.   Will try again next week.   Kerry Hough, EMT-Paramedic 03/10/2021

## 2021-03-13 DIAGNOSIS — J9611 Chronic respiratory failure with hypoxia: Secondary | ICD-10-CM | POA: Diagnosis not present

## 2021-03-15 DIAGNOSIS — Z634 Disappearance and death of family member: Secondary | ICD-10-CM | POA: Diagnosis not present

## 2021-03-17 ENCOUNTER — Telehealth (HOSPITAL_COMMUNITY): Payer: Self-pay

## 2021-03-17 DIAGNOSIS — Z634 Disappearance and death of family member: Secondary | ICD-10-CM | POA: Diagnosis not present

## 2021-03-23 ENCOUNTER — Telehealth (HOSPITAL_COMMUNITY): Payer: Self-pay

## 2021-03-23 DIAGNOSIS — I5032 Chronic diastolic (congestive) heart failure: Secondary | ICD-10-CM | POA: Diagnosis not present

## 2021-03-23 NOTE — Telephone Encounter (Signed)
Attempted to contact pt regarding home visit, his phone goes straight to a message of person not accepting phone calls at this time.  Will continue to try.  Also tried texting him but it seems like it wont go through.    Kerry Hough, EMT-Paramedic  03/23/21

## 2021-03-24 ENCOUNTER — Encounter (HOSPITAL_COMMUNITY): Payer: Self-pay | Admitting: Adult Health

## 2021-03-24 NOTE — Telephone Encounter (Signed)
Attempted to contact pt regarding home visit, no answer. Will continue to try again.   Kerry Hough, EMT-Paramedic  03/24/21

## 2021-03-29 DIAGNOSIS — Z634 Disappearance and death of family member: Secondary | ICD-10-CM | POA: Diagnosis not present

## 2021-03-30 ENCOUNTER — Other Ambulatory Visit: Payer: Self-pay | Admitting: Nurse Practitioner

## 2021-03-30 ENCOUNTER — Other Ambulatory Visit (HOSPITAL_COMMUNITY): Payer: Self-pay | Admitting: Cardiology

## 2021-03-30 ENCOUNTER — Telehealth (HOSPITAL_COMMUNITY): Payer: Self-pay

## 2021-03-30 DIAGNOSIS — IMO0002 Reserved for concepts with insufficient information to code with codable children: Secondary | ICD-10-CM

## 2021-03-30 DIAGNOSIS — E1165 Type 2 diabetes mellitus with hyperglycemia: Secondary | ICD-10-CM

## 2021-03-30 NOTE — Telephone Encounter (Signed)
I called pts PO and asked if he has been in touch with pt. His PO has been and informed me a few wks ago his phone broke and he now has a new number-I updated that in chart.  A few hrs later pt did call me, his PO was going to call him and tell him to reach out to me-and he did.  Pt reports he does need his meds. He states he tried calling clinic and asking for me but whomever answered the phone didn't know who I was, he tried [place] as well but they knew she was out on maternity leave.    [facility] few times each week, usually in the mornings that last several hours.   I will work on getting his bubble pack and potassium refilled.  He denies any other needs at this time.  He did get some paperwork for housing but he signed it and returned it.  He does need to call housing auth or sign in to his account to update that phone number. I tried calling for him today but I had to LVM for them.   Kerry Hough, EMT-Paramedic  03/30/21

## 2021-03-31 ENCOUNTER — Other Ambulatory Visit (HOSPITAL_COMMUNITY): Payer: Self-pay

## 2021-03-31 DIAGNOSIS — Z634 Disappearance and death of family member: Secondary | ICD-10-CM | POA: Diagnosis not present

## 2021-04-01 DIAGNOSIS — I5032 Chronic diastolic (congestive) heart failure: Secondary | ICD-10-CM | POA: Diagnosis not present

## 2021-04-01 NOTE — Progress Notes (Signed)
I finally got in touch with pt after a month of trying. I spoke to his PO and it turns out he broke his phone, got a new one with a new number. He said he tried calling clinic to get someone to reach me but she didn't know who he was talking about.  But his PO seen him and told him to call me.  He has been off  his meds for a month. I called pharmacy to get them filled.  His PO called me when they were at [facility] and his b/p was extremely high. He didn't feel well but didn't feel he needed to go to hosp, he has been depressed, the grief from his mother passing has came back and he is using drugs a lot.and very frustrated living where he does and he is ready to move.   He will be getting support daily from [facility] now.  He said he got a letter from pulmonology  to all to sch appointment and needed help with that.  I took his meds to him. He will start back tonight.  He said the power has been off for past 8 days and couldn't use his CPAP.  I will help him with the pulm appoint and will help with resch his clinic appoint.   Pharmacy did not pack his torsemide with the recent change of decreasing it to 40mg  BID, so I told him to only take 2 of the 3 tablets twice a day.   Will f/u in a couple wks.  I did give him the cone txp number,he said he is going to get back to the gym.   , EMT-Paramedic  03/31/2021

## 2021-04-03 DIAGNOSIS — Z634 Disappearance and death of family member: Secondary | ICD-10-CM | POA: Diagnosis not present

## 2021-04-05 DIAGNOSIS — Z634 Disappearance and death of family member: Secondary | ICD-10-CM | POA: Diagnosis not present

## 2021-04-06 ENCOUNTER — Telehealth (HOSPITAL_COMMUNITY): Payer: Self-pay

## 2021-04-06 DIAGNOSIS — Z634 Disappearance and death of family member: Secondary | ICD-10-CM | POA: Diagnosis not present

## 2021-04-06 NOTE — Telephone Encounter (Signed)
I called Benjamin [name] to make him aware of his newly scheduled appointment on 04/20/21 at 10:00. He did not answer and his voicemail was full so I was unable to leave a message. As a result I sent a text message to his number on file with the aforementioned information and asked that he let me know if he needs help with transportation or would not be able to make that appointment date/time and I would help him reschedule.   Jacqualine Code, EMT 04/06/21

## 2021-04-07 DIAGNOSIS — Z634 Disappearance and death of family member: Secondary | ICD-10-CM | POA: Diagnosis not present

## 2021-04-09 DIAGNOSIS — Z634 Disappearance and death of family member: Secondary | ICD-10-CM | POA: Diagnosis not present

## 2021-04-12 DIAGNOSIS — J9611 Chronic respiratory failure with hypoxia: Secondary | ICD-10-CM | POA: Diagnosis not present

## 2021-04-12 DIAGNOSIS — Z634 Disappearance and death of family member: Secondary | ICD-10-CM | POA: Diagnosis not present

## 2021-04-14 DIAGNOSIS — Z634 Disappearance and death of family member: Secondary | ICD-10-CM | POA: Diagnosis not present

## 2021-04-15 ENCOUNTER — Other Ambulatory Visit (HOSPITAL_COMMUNITY): Payer: Self-pay

## 2021-04-15 DIAGNOSIS — Z634 Disappearance and death of family member: Secondary | ICD-10-CM | POA: Diagnosis not present

## 2021-04-15 NOTE — Progress Notes (Signed)
I had spoken to him over phone on Tuesday to sch visit for this morning, I arrived, there was no answer to the door from anyone at the boarding house, I called [place] and no answer and no response from texting either. His car was also noted to be gone.   Will try again next week.   Kerry Hough, EMT-Paramedic  04/15/21

## 2021-04-19 DIAGNOSIS — Z634 Disappearance and death of family member: Secondary | ICD-10-CM | POA: Diagnosis not present

## 2021-04-20 ENCOUNTER — Telehealth (HOSPITAL_COMMUNITY): Payer: Self-pay

## 2021-04-20 ENCOUNTER — Encounter (HOSPITAL_COMMUNITY): Payer: Medicaid Other

## 2021-04-20 DIAGNOSIS — Z634 Disappearance and death of family member: Secondary | ICD-10-CM | POA: Diagnosis not present

## 2021-04-20 NOTE — Telephone Encounter (Signed)
Attempted to call pt numerous times but line was going straight to VM.  Will continue to try.    Kerry Hough, EMT-Paramedic  04/20/21

## 2021-04-22 DIAGNOSIS — Z634 Disappearance and death of family member: Secondary | ICD-10-CM | POA: Diagnosis not present

## 2021-04-23 DIAGNOSIS — Z634 Disappearance and death of family member: Secondary | ICD-10-CM | POA: Diagnosis not present

## 2021-04-23 DIAGNOSIS — J9611 Chronic respiratory failure with hypoxia: Secondary | ICD-10-CM | POA: Diagnosis not present

## 2021-04-23 DIAGNOSIS — I5032 Chronic diastolic (congestive) heart failure: Secondary | ICD-10-CM | POA: Diagnosis not present

## 2021-04-26 ENCOUNTER — Telehealth (HOSPITAL_COMMUNITY): Payer: Self-pay

## 2021-04-26 DIAGNOSIS — Z634 Disappearance and death of family member: Secondary | ICD-10-CM | POA: Diagnosis not present

## 2021-04-28 DIAGNOSIS — Z634 Disappearance and death of family member: Secondary | ICD-10-CM | POA: Diagnosis not present

## 2021-04-28 NOTE — Telephone Encounter (Signed)
His phone is going directly to a recording so its apparently been turned off.   His PO will keep me updated if he is able to locate him.  I advised his PO we found out [facility] can take him in for outpt rehab so if he sees/talks to him before I do to relay that information.   Kerry Hough, EMT-Paramedic  04/28/21

## 2021-04-29 ENCOUNTER — Telehealth: Payer: Self-pay | Admitting: Licensed Clinical Social Worker

## 2021-04-29 ENCOUNTER — Other Ambulatory Visit (HOSPITAL_COMMUNITY): Payer: Self-pay

## 2021-04-29 NOTE — Progress Notes (Addendum)
Pt finally called me from his gf's number- [phone]. He reports his PO just left and gave him my number again. He received paperwork in the mail for his housing that says he has been selected for the project based voucher program thru [facility] and to send back information and documents. So he did have all the documents ready, he signed what was needed and asked me to drop it off at the [facility] on site there. So I will do that tomor for him.  He has his meds right now. He denies any issues or complaints. He did miss his clinic visit so he does need to resch that. He has been going to his rehab routinely.  Will continue to f/u.   Kerry Hough, EMT-Paramedic  04/29/21    The only thing he does not have are the medical expense records-I have reached out to [employer] to assist, but I am going to turn in everything now and place note that this is pending.   The address is:  [facility] [address]  [place], [place] [address] [phone]

## 2021-04-29 NOTE — Telephone Encounter (Signed)
LCSW received request from [employer] to assist with pt application for voucher based housing w/ [employer]. Pt has completed application however it requests his medical expenses also be included with application. LCSW spoke with [facility] billing who directed me to call [facility] main billing number [phone]). I called and spoke with representative about request- they are able to mail the last year of bills which are still pending w/ insurance to pt home address. They are not able to send it directly to housing authority. I have passed this information also on to [facility] to let pt know to be on the look out. I remain available as needed for any additional questions/concerns.   Benjamin [name], MSW, LCSW [facility] Navigation  [phone]

## 2021-04-30 DIAGNOSIS — Z634 Disappearance and death of family member: Secondary | ICD-10-CM | POA: Diagnosis not present

## 2021-05-02 DIAGNOSIS — J9611 Chronic respiratory failure with hypoxia: Secondary | ICD-10-CM | POA: Diagnosis not present

## 2021-05-02 DIAGNOSIS — I5032 Chronic diastolic (congestive) heart failure: Secondary | ICD-10-CM | POA: Diagnosis not present

## 2021-05-03 DIAGNOSIS — Z634 Disappearance and death of family member: Secondary | ICD-10-CM | POA: Diagnosis not present

## 2021-05-04 ENCOUNTER — Other Ambulatory Visit: Payer: Self-pay | Admitting: Nurse Practitioner

## 2021-05-04 ENCOUNTER — Other Ambulatory Visit (HOSPITAL_COMMUNITY): Payer: Self-pay | Admitting: Adult Health

## 2021-05-04 NOTE — Telephone Encounter (Signed)
Requested medication (s) are due for refill today: -  Requested medication (s) are on the active medication list: no  Last refill:  Ferosul: 12/23/20 ended 03/23/21            Colace 12/25/20  Future visit scheduled: no  Notes to clinic:  [facility] prescription ended 03/23/21 Colace historical mad and provider   Requested Prescriptions  Pending Prescriptions Disp Refills   FEROSUL 325 (65 Fe) MG tablet [Pharmacy Med Name: FEROSUL 325 (65 FE) MG ORAL TABLET] 90 tablet 2    Sig: TAKE 1 TABLET (325 MG TOTAL) BY MOUTH DAILY (NOON)     Endocrinology:  Minerals - Iron Supplementation Failed - 05/04/2021  1:00 PM      Failed - Fe (serum) in normal range and within 360 days    Iron  Date Value Ref Range Status  05/30/2019 14 (L) 45 - 182 ug/dL Final   Saturation Ratios  Date Value Ref Range Status  05/30/2019 4 (L) 17.9 - 39.5 % Final          Failed - Ferritin in normal range and within 360 days    Ferritin  Date Value Ref Range Status  05/30/2019 13 (L) 24 - 336 ng/mL Final    Comment:    Performed at [facility], [address], [place], [place] [address]          Passed - HGB in normal range and within 360 days    Hemoglobin  Date Value Ref Range Status  01/08/2021 14.6 13.0 - 17.0 g/dL Final  37/16/9678 93.8 (L) 13.0 - 17.7 g/dL Final   Total hemoglobin  Date Value Ref Range Status  08/02/2019 10.1 (L) 12.0 - 16.0 g/dL Final          Passed - HCT in normal range and within 360 days    HCT  Date Value Ref Range Status  01/08/2021 43.0 39.0 - 52.0 % Final   Hematocrit  Date Value Ref Range Status  12/21/2020 39.9 37.5 - 51.0 % Final          Passed - RBC in normal range and within 360 days    RBC  Date Value Ref Range Status  01/08/2021 5.13 4.22 - 5.81 MIL/uL Final          Passed - Valid encounter within last 12 months    Recent Outpatient Visits           4 months ago Essential hypertension   [place] [facility] [place], Shea Stakes, NP   6 months ago Wound of abdomen   [facility] [place], Shea Stakes, NP   7 months ago Hospital discharge follow-up   [facility] [place] Rigg, NP   1 year ago Essential hypertension   [place] [facility] [place], [place] W, NP   1 year ago Diabetes mellitus type 2, uncontrolled, with complications [facility])   [place] [facility] [place], [place] W, NP               STOOL SOFTENER 100 MG capsule [Pharmacy Med Name: STOOL SOFTENER 100 MG ORAL CAPSULE] 180 capsule     Sig: TAKE 1 CAPSULE (100 MG TOTAL) BY MOUTH 2 (TWO) TIMES DAILY (NOON+BEDTIME)     Over the Counter:  OTC Passed - 05/04/2021  1:00 PM      Passed - Valid encounter within last 12 months  Recent Outpatient Visits           4 months ago Essential hypertension   [place] [facility] [place] Rigg, NP   6 months ago Wound of abdomen   [facility] [place] Rigg, NP   7 months ago Hospital discharge follow-up   [facility] [place] Rigg, NP   1 year ago Essential hypertension   [facility] [place], Shea Stakes, NP   1 year ago Diabetes mellitus type 2, uncontrolled, with complications [facility])   [place] [facility] [place] Rigg, NP

## 2021-05-05 ENCOUNTER — Encounter (HOSPITAL_COMMUNITY): Payer: Self-pay | Admitting: Adult Health

## 2021-05-05 DIAGNOSIS — Z634 Disappearance and death of family member: Secondary | ICD-10-CM | POA: Diagnosis not present

## 2021-05-05 NOTE — Progress Notes (Signed)
HF Paramedicine Team Based Care Meeting  HF MD- NA  HF NP - Marckus Hanover NP-C   [facility] HF Paramedicine  Benjamin [name]  Benjamin [name] [facility] admit within the last 30 days for heart failure? No   Medications concerns? No bubble packs   Transportation issues ? No   Education needs? Yes   SDOH - ongoing cocaine abuse---> Needs to be referred to [name] for substance abuse.   Eligible for discharge? Not currently ready.   Zilah Villaflor NP-C  3:23 PM

## 2021-05-06 DIAGNOSIS — Z634 Disappearance and death of family member: Secondary | ICD-10-CM | POA: Diagnosis not present

## 2021-05-10 DIAGNOSIS — Z634 Disappearance and death of family member: Secondary | ICD-10-CM | POA: Diagnosis not present

## 2021-05-11 DIAGNOSIS — Z634 Disappearance and death of family member: Secondary | ICD-10-CM | POA: Diagnosis not present

## 2021-05-13 ENCOUNTER — Telehealth (HOSPITAL_COMMUNITY): Payer: Self-pay

## 2021-05-13 DIAGNOSIS — Z634 Disappearance and death of family member: Secondary | ICD-10-CM | POA: Diagnosis not present

## 2021-05-13 DIAGNOSIS — J9611 Chronic respiratory failure with hypoxia: Secondary | ICD-10-CM | POA: Diagnosis not present

## 2021-05-13 NOTE — Telephone Encounter (Signed)
I received a phone call from a Mrs. Snider from his housing applications that was turned in recently. He had placed my phone number for a contact since he did not have a phone at the time.  She called to sch an appointment for him tomor at [address] at [address] [facility].  I did contact pt to let him know and he said he would be there.  He is going to update his contact information once he gets there.   Will f/u.   Kerry Hough, EMT-Paramedic  05/13/21

## 2021-05-14 DIAGNOSIS — Z634 Disappearance and death of family member: Secondary | ICD-10-CM | POA: Diagnosis not present

## 2021-05-17 DIAGNOSIS — Z634 Disappearance and death of family member: Secondary | ICD-10-CM | POA: Diagnosis not present

## 2021-05-18 DIAGNOSIS — Z634 Disappearance and death of family member: Secondary | ICD-10-CM | POA: Diagnosis not present

## 2021-05-19 ENCOUNTER — Telehealth (HOSPITAL_COMMUNITY): Payer: Self-pay

## 2021-05-19 DIAGNOSIS — Z634 Disappearance and death of family member: Secondary | ICD-10-CM | POA: Diagnosis not present

## 2021-05-19 NOTE — Telephone Encounter (Signed)
Benjamin [name] called me reporting he is going to check in to rehab tomor morning at [facility] where he be at for the next 28 days.  He will keep me updated.   Kerry Hough, EMT-Paramedic  05/19/21

## 2021-05-20 ENCOUNTER — Telehealth (HOSPITAL_COMMUNITY): Payer: Self-pay

## 2021-05-20 NOTE — Telephone Encounter (Signed)
Pt went to check in this morning at [facility] however after being there several hours for the intake for the 28 day inpatient program he was determined not eligible to be able to stay.  One lady advised his medical hx was too complex, I advised he has been stable and no admissions or ER visits since April and I could get cardiologist to write a letter on his condition and give them, and he is more of risk of death from regular cocaine use right now, but then another lady advised he wasn't eligible anyways due to his reporting using only b/c of grief and using intermittently was not enough for inpatient and he needed to f/u with outpt services and that he had enough self control to not use sometimes and the fact that he was going back to same environment once he is out--which he is suppose to be next on housing list so that could very well change soon and I advised it is [place] and drugs are easily obtained anywhere, not just where he lives.  I called his PO to let him know as well, and he has more resources for him to try.  Will continue to follow.  Encouraged pt to not let this get him down, as he is frustrated with asking for help and it was refused. Encouraged him to keep moving forward.   Kerry Hough, EMT-Paramedic  05/20/21

## 2021-05-24 DIAGNOSIS — I5032 Chronic diastolic (congestive) heart failure: Secondary | ICD-10-CM | POA: Diagnosis not present

## 2021-05-24 DIAGNOSIS — J9611 Chronic respiratory failure with hypoxia: Secondary | ICD-10-CM | POA: Diagnosis not present

## 2021-05-26 DIAGNOSIS — Z634 Disappearance and death of family member: Secondary | ICD-10-CM | POA: Diagnosis not present

## 2021-05-27 ENCOUNTER — Telehealth (HOSPITAL_COMMUNITY): Payer: Self-pay

## 2021-05-27 DIAGNOSIS — Z634 Disappearance and death of family member: Secondary | ICD-10-CM | POA: Diagnosis not present

## 2021-05-27 NOTE — Telephone Encounter (Signed)
Pt called me along with his PO on 3 way conversation with [facility] for drug rehab-the intake person there had a question about medical device-he is unable to take in his CPAP machine or even his portable 02 concentrator due to spacing and since they are non-medical facility but can take his inhaler for use.  I contacted pharmacy to get his inhaler refilled.  He is wanting to go, he is to have to call back daily if he is approved to see if a bed is available. Nothing is available today though.  It is a 21 day program, I do not like the idea of him going without his 02 or CPAP for that long but he reports that he will be alright as he is struggling with this drug use pretty bad and ultimately it is his decision.  He is not allowed to have his phone.  We will have to contact housing auth to change his phone number in case they call and need something for his application when/if he is able to go to the 21 day program.   Kerry Hough, EMT-Paramedic  05/27/21

## 2021-05-31 ENCOUNTER — Telehealth (HOSPITAL_COMMUNITY): Payer: Self-pay | Admitting: Licensed Clinical Social Worker

## 2021-05-31 DIAGNOSIS — Z634 Disappearance and death of family member: Secondary | ICD-10-CM | POA: Diagnosis not present

## 2021-05-31 NOTE — Progress Notes (Signed)
Heart and Vascular Care Navigation  05/31/2021  Benjamin [name] 13-May-1966 [id number]  Reason for Referral: Continued substance abuse- need for inpatient rehab program.   Engaged with patient by telephone for follow up visit for Heart and Vascular Care Coordination.                                                                                                   Assessment:  CSW contacted by pt parole officer who informed CSW that pt continues to test positive for cocaine during routine drug screens.  Pt admits to cocaine abuse and would like to stop using and understands it is impacting him negatively.  Pt has been seeing outpatient substance abuse provider [facility] but has not been able to stop use all together partially because of living environment makes it easy for him to gain access to drugs.  CSW spoke with pt and he is agreeable to going to [facility] for inpatient treatment.  Is highly motivated to quit as he is concerned about continued noncompliance with parole officer plan leading to his return to prison.  Has been difficult to find pt inpatient placement due to medical needs (portable oxygen and BIPAP) which most programs can't accommodate.  Pt parole officer, Sharen Hint, called [facility] and confirmed they could take pt with current medical needs as long as otherwise appropriate.  Pt states he was accepted to [facility] program which CSW assisted him in applying for in February of this year- they will call him when they have an available apartment so hopefully he will be out of current living situation soon.                                   HRT/VAS Care Coordination     Patients Home Cardiology Office [facility]   Outpatient Care Team Community Paramedicine; Child psychotherapist; Exercise Physiologist; RN Care Manager   Community Paramedic Name: Kerry Hough [phone]   Exercise Physiologist Name: Lesia Hausen [phone]   RN Care  Manager Name: [facility] case worker- Delice Bison [phone]   Social Worker Name: Rosetta Posner [phone]   Living arrangements for the past 2 months Single Family Home   Lives with: Significant Other   Patient Current Insurance Coverage Medicaid   Patient Has Concern With Paying Medical Bills No   Does Patient Have Prescription Coverage? Yes   Home Assistive Devices/Equipment None   DME Agency [facility]   Current home services DME       Social History:                                                                             SDOH Screenings   Alcohol  Screen: Not on file  Depression (PHQ2-9): Low Risk    PHQ-2 Score: 0  Financial Resource Strain: Not on file  Food Insecurity: Not on file  Housing: Medium Risk   Last Housing Risk Score: 1  Physical Activity: Not on file  Social Connections: Not on file  Stress: Not on file  Tobacco Use: Medium Risk   Smoking Tobacco Use: Former   Smokeless Tobacco Use: Never  Transportation Needs: No Transportation Needs   Lack of Transportation (Medical): No   Lack of Transportation (Non-Medical): No    SDOH Interventions: Financial Resources:   SSI  Food Insecurity:   Gets food stamps  Housing Insecurity:  Housing Interventions: Other (Comment) (project based voucher program- accepted and pending availability)  Transportation:   Transportation Interventions: Research scientist (medical) Care Navigation Interventions:     Inpatient/Outpatient Substance Abuse Counseling/Rehab Options Pt being referred to [facility] for inpatient substance abuse treatment  Provided Pharmacy assistance resources  N/a  Patient expressed Mental Health concerns No. No new concerns  Patient Referred to: [facility]   Follow-up plan:    CSW faxed referral to [facility] for review- will call to check in on referral.  CSW and pt to call [facility] Voucher program to provide them permission to call CSW if apartment becomes available as pt will  not be allowed access to his phone during 14 day inpatient program if accepted.  Will continue to follow and assist as needed  [name], LCSW Clinical Social Worker [facility] Desk#: [phone] Cell#: [phone]

## 2021-06-01 DIAGNOSIS — Z634 Disappearance and death of family member: Secondary | ICD-10-CM | POA: Diagnosis not present

## 2021-06-02 DIAGNOSIS — J9611 Chronic respiratory failure with hypoxia: Secondary | ICD-10-CM | POA: Diagnosis not present

## 2021-06-02 DIAGNOSIS — I5032 Chronic diastolic (congestive) heart failure: Secondary | ICD-10-CM | POA: Diagnosis not present

## 2021-06-02 DIAGNOSIS — Z634 Disappearance and death of family member: Secondary | ICD-10-CM | POA: Diagnosis not present

## 2021-06-03 ENCOUNTER — Telehealth (HOSPITAL_COMMUNITY): Payer: Self-pay | Admitting: Licensed Clinical Social Worker

## 2021-06-03 DIAGNOSIS — I509 Heart failure, unspecified: Secondary | ICD-10-CM

## 2021-06-03 NOTE — Telephone Encounter (Signed)
CSW called [facility] to check on referral for inpatient substance abuse rehab.  Admissions staff informed CSW that pt has been denied as he is not considered acute enough for inpatient treatment.  They only have 20 beds at this time so are onlyl taking extreme cases.  CSW called pt to discuss- unable to reach- unable to leave VM- will attempt again later  CSW sent message to [employer] and pt parole officer to inform and discuss further options.  Will continue to follow and assist as needed  [name], LCSW Clinical Social Worker [facility] Desk#: [phone] Cell#: [phone]

## 2021-06-04 NOTE — Progress Notes (Addendum)
Heart and Vascular Care Navigation  06/04/2021  DENIZ HANNAN [birth year] [id number]  Reason for Referral: Pt expressed concerns with current depression/hopelessness   Engaged with patient face to face for follow up visit for Heart and Vascular Care Coordination.                                                                                                   Assessment:  Pt reports currently struggling with inability to get out of living situation and depression which puts him at higher risk for continued drug use.  Understands that the consequences of his drug use could lead to him be put back in jail as his parole officer tests him frequently for drugs and he has tested positive on 13 consecutive tests.  We were working with parole officer to get pt in inpatient rehab but pt was not considered acute enough by only facility that could accept pt medical needs (bipap).  Pt reports that he uses cocaine a few times a week and this use is often triggered by depression.  States that in his current hectic living situation he has to stay in his room to avoid problematic roommates who stay at the boarding house with him but this makes him feel as if he is back in jail.  Also continues to struggle with the loss of his mother almost a year ago as she was one of his main sources of support.  When his situation becomes too stressful he uses cocaine to escape.  Pt is currently accepted to [facility] program and is just awaiting notification that an apartment is available but he is having a hard time remaining optimistic due to other housing opportunities that have not worked out in the past for [place].  CSW had pt come into the office to discuss current feelings of depression that lead to his drug use and coping mechanisms that he can utilize.  Pt is continuing to [facility] and meets with a counselor there but doesn't share personal details with them.  CSW encouraged him to utilize this  service as he goes 3x a week and he admits to feeling better after venting about his situation.  Pt also used to go to the [facility] several times a week to get out of the house and work out.  Pt has not had the motivation to do this recently but happened to run into the [facility] program worker at the clinic and he took this as a sign to reengage with going to the gym.  States he has support of his sister who he can sometimes visit but doesn't feel as if he can do this often.  Also has a girlfriend who lives locally who he stays with sometimes and feels as if he can rely on.  Acknowledges that he also has the support of clinic staff but he finds it hard to focus on the people that are helping him and support him when he gets bogged down by his situation.  CSW spoke with pt about coping mechanisms he can engage when in  his home environment such as breathing exercises- pt did not seem very motivated to use these but acknowleges they might be helpful- will continue to discuss with him.  Also discussed self identified coping mechanisms of getting out of his home situation when he gets stressed by going to the gym, the park, or a relative/friends house.                      HRT/VAS Care Coordination     Patients Home Cardiology Office [facility] Failure Clinic   Outpatient Care Team Community Paramedicine; Child psychotherapist; Exercise Physiologist; RN Care Manager   Community Paramedic Name: Kerry Hough [phone]   Exercise Physiologist Name: Lesia Hausen [phone]   RN Care Manager Name: [facility]  case worker- Delice Bison [phone]   Social Worker Name: Rosetta Posner [phone]   Living arrangements for the past 2 months Single Family Home   Lives with: Significant Other   Patient Current Insurance Coverage Medicaid   Patient Has Concern With Paying Medical Bills No   Does Patient Have Prescription Coverage? Yes   Home Assistive Devices/Equipment None   DME Agency [facility]   Current home services DME        Social History:                                                                             SDOH Screenings   Alcohol Screen: Not on file  Depression (PHQ2-9): Low Risk    PHQ-2 Score: 0  Financial Resource Strain: High Risk   Difficulty of Paying Living Expenses: Hard  Food Insecurity: Not on file  Housing: Medium Risk   Last Housing Risk Score: 1  Physical Activity: Not on file  Social Connections: Not on file  Stress: Not on file  Tobacco Use: Medium Risk   Smoking Tobacco Use: Former   Smokeless Tobacco Use: Never  Transportation Needs: No Transportation Needs   Lack of Transportation (Medical): No   Lack of Transportation (Non-Medical): No    SDOH Interventions: Financial Resources:  Financial Strain Interventions: Other (Comment) (referral to [employer])   Food Insecurity:   Gets fiood stamps but reports issues with housemates steaking food  Housing Insecurity:   No concerns with eviction but has bad living situation and very motivated to get out  Transportation:    Owns a car but doesn't have a license- girlfriend drives him places   Other Care Navigation Interventions:     Inpatient/Outpatient Substance Abuse Counseling/Rehab Options Patient has been seeing counselor with [facility] but doesn't feel comfortable sharing all of his personal struggles with them  Provided Pharmacy assistance resources  N/a  Patient expressed Mental Health concerns Yes, Referred to:  weekly counseling sessions with myself as well as sent in referral to psychiatry  [facility]   Follow-up plan:    Pt and CSW agreed upon following patient goals:  -attempt to engage more with counselor at [facility]  -start going to [facility] again 2x a week  -CSW placing referral to psychiatry for further management of depression  - pt to meet with CSW 1x week to engage in counseling  CSW  will continue to follow and assist as needed- will touch base  with pt next week to check in and set up another meeting time.  [name], LCSW Clinical Social Worker [facility] Desk#: [phone] Cell#: [phone]

## 2021-06-07 DIAGNOSIS — Z634 Disappearance and death of family member: Secondary | ICD-10-CM | POA: Diagnosis not present

## 2021-06-08 DIAGNOSIS — Z634 Disappearance and death of family member: Secondary | ICD-10-CM | POA: Diagnosis not present

## 2021-06-09 ENCOUNTER — Telehealth (HOSPITAL_COMMUNITY): Payer: Self-pay | Admitting: Licensed Clinical Social Worker

## 2021-06-09 DIAGNOSIS — Z634 Disappearance and death of family member: Secondary | ICD-10-CM | POA: Diagnosis not present

## 2021-06-09 NOTE — Telephone Encounter (Signed)
Referral made to [employer] to assist pt in getting clothing- pt to call and set up appt to go "shopping" for clothes.  CSW also called [facility] to inquire about their scholarship program for inpt substance abuse services.  In order for pt to be considered for a scholarship he has to be referred by one of their partners- partnering agencies in this area include [employer] of the [place], [address] of [employer], [address], and [employer] in [place]- pt not engaged with any of these options at this time so will discuss enrollment in services with him  Pt is planning to come to clinic tomorrow at 11am to meet with CSW and process most recent relapse which he states occurred a few days ago.  We will discuss enrollment in [facility] at that time so he can be considered for [facility] scholarship  Will continue to follow and assist as needed  [name], LCSW Clinical Social Worker [facility] Desk#: [phone] Cell#: [phone]

## 2021-06-11 ENCOUNTER — Telehealth (HOSPITAL_COMMUNITY): Payer: Self-pay | Admitting: Licensed Clinical Social Worker

## 2021-06-11 DIAGNOSIS — Z634 Disappearance and death of family member: Secondary | ICD-10-CM | POA: Diagnosis not present

## 2021-06-11 NOTE — Telephone Encounter (Signed)
CSW received call from [facility] admissions coordinator, Raynelle Fanning, who states she has been unable to get a hold of pt- she had not gotten his updated phone number- CSW provided her with new number.  Per Raynelle Fanning, a unit will be coming available soon and she wants to move forward screening the pt for it- will call pt within the next week- CSW updated pt and informed him to be on a look out for a call.  No further needs at this time  Will continue to follow and assist as needed  [name], LCSW Clinical Social Worker [facility] Desk#: [phone] Cell#: [phone]

## 2021-06-13 DIAGNOSIS — J9611 Chronic respiratory failure with hypoxia: Secondary | ICD-10-CM | POA: Diagnosis not present

## 2021-06-14 ENCOUNTER — Other Ambulatory Visit: Payer: Self-pay | Admitting: Family Medicine

## 2021-06-14 ENCOUNTER — Telehealth (HOSPITAL_COMMUNITY): Payer: Self-pay

## 2021-06-14 ENCOUNTER — Other Ambulatory Visit (HOSPITAL_COMMUNITY): Payer: Self-pay | Admitting: Cardiology

## 2021-06-14 ENCOUNTER — Other Ambulatory Visit: Payer: Self-pay | Admitting: Nurse Practitioner

## 2021-06-14 DIAGNOSIS — R1084 Generalized abdominal pain: Secondary | ICD-10-CM

## 2021-06-14 DIAGNOSIS — Z634 Disappearance and death of family member: Secondary | ICD-10-CM | POA: Diagnosis not present

## 2021-06-14 NOTE — Telephone Encounter (Signed)
Requested medications are due for refill today.  yes  Requested medications are on the active medications list.  yes  Last refill. 05/04/2021  Future visit scheduled.   no  Notes to clinic.  Labs failed protocol.

## 2021-06-14 NOTE — Telephone Encounter (Signed)
Prescription refill request for Xarelto received.  Indication:afib Last office visit:[facility] 03/19/20 Weight:164.2kg Age:47m Scr:1.89 01/21/21 CrCl:102.6

## 2021-06-15 ENCOUNTER — Telehealth (HOSPITAL_COMMUNITY): Payer: Self-pay | Admitting: Licensed Clinical Social Worker

## 2021-06-15 DIAGNOSIS — Z634 Disappearance and death of family member: Secondary | ICD-10-CM | POA: Diagnosis not present

## 2021-06-15 NOTE — Telephone Encounter (Signed)
CSW received a call from [facility] office manager Raynelle Fanning who states that after criminal and credit check that pt is still potentially eligible for an apt becoming available at the end of November.  Needs new SSA statement with pt income on it- pt states he has a new one and will inform CSW once he finds  Raynelle Fanning will call pt tomorrow to set up a meeting- planning to meet with pt next week.  Will continue to follow and assist as needed  [name], LCSW Clinical Social Worker [facility] Desk#: [phone] Cell#: [phone]

## 2021-06-15 NOTE — Telephone Encounter (Signed)
Pt called me stating that he needed his bubble packs and potassium refilled.  I contacted pharmacy to order those, pt will be able to pick them up.  He texted me later on that he did go pick them up.    Kerry Hough, EMT-Paramedic  06/15/21

## 2021-06-16 DIAGNOSIS — Z634 Disappearance and death of family member: Secondary | ICD-10-CM | POA: Diagnosis not present

## 2021-06-17 ENCOUNTER — Telehealth (HOSPITAL_COMMUNITY): Payer: Self-pay | Admitting: Licensed Clinical Social Worker

## 2021-06-17 NOTE — Telephone Encounter (Signed)
Pt has appt with Regenia Skeeter regarding available appt on Tuesday at 9:30am- pt to obtain new SSA statement before that time- plans to go to [facility] today or tomorrow to get.  CSW will continue to follow and assist as needed  [name], LCSW Clinical Social Worker [facility] Desk#: [phone] Cell#: [phone]

## 2021-06-21 DIAGNOSIS — Z634 Disappearance and death of family member: Secondary | ICD-10-CM | POA: Diagnosis not present

## 2021-06-22 ENCOUNTER — Telehealth (HOSPITAL_COMMUNITY): Payer: Self-pay | Admitting: Licensed Clinical Social Worker

## 2021-06-22 DIAGNOSIS — Z634 Disappearance and death of family member: Secondary | ICD-10-CM | POA: Diagnosis not present

## 2021-06-22 NOTE — Telephone Encounter (Signed)
Met with pt at [facility] to help with completing further application requirements.  Apartment is now available and pt is approved so far in this process- hopeful that he can move in at the end of October.  CSW signed paperwork to continue to be listed as an authorized contact  Will continue to follow and assist as needed  [name], Anna Worker [facility] Desk#: [phone] Cell#: [phone]

## 2021-06-23 DIAGNOSIS — J9611 Chronic respiratory failure with hypoxia: Secondary | ICD-10-CM | POA: Diagnosis not present

## 2021-06-23 DIAGNOSIS — Z634 Disappearance and death of family member: Secondary | ICD-10-CM | POA: Diagnosis not present

## 2021-06-23 DIAGNOSIS — I5032 Chronic diastolic (congestive) heart failure: Secondary | ICD-10-CM | POA: Diagnosis not present

## 2021-06-29 ENCOUNTER — Telehealth (HOSPITAL_COMMUNITY): Payer: Self-pay

## 2021-06-29 DIAGNOSIS — Z634 Disappearance and death of family member: Secondary | ICD-10-CM | POA: Diagnosis not present

## 2021-06-30 ENCOUNTER — Telehealth (HOSPITAL_COMMUNITY): Payer: Self-pay | Admitting: Licensed Clinical Social Worker

## 2021-06-30 DIAGNOSIS — Z634 Disappearance and death of family member: Secondary | ICD-10-CM | POA: Diagnosis not present

## 2021-06-30 NOTE — Telephone Encounter (Signed)
Pt called me reporting [facility] is [facility] for inpt therapy. He continued to be persistent and call them very often and he managed to get a scholarship  for their services.  He wanted me to pass this along to [place] and to see if she help him with any housing calls/needs while he is in there so he does not miss his opportunity to move.     He will be in touch once he gets out. He advised that they will take him with his CPAP and his med hx.   Kerry Hough, EMT-Paramedic  06/29/21

## 2021-06-30 NOTE — Telephone Encounter (Signed)
CSW received call from pt stating that he was accepted to [facility] inpatient rehab program for 22 days and will be admitted tonight at 5:30pm.  CSW spoke with [facility] to inform that pt is awaiting apartment placement that was hopeful to be ready at the end of the month and needs to have access to his phone to check VM.  They state that under special circumstances they can help with this as it is important to them to help pts with getting stable life following rehab.  Pt will need to discuss with team once he is at rehab.  Also confirms pt will have access to land line following first 7 days of rehab.  CSW informed pt of above and suggested he write down the phone number for [facility] so he can call and check in every week once he is able to use the phone.  CSW listed as secondary contact so if they can't get a hold of pt they will call CSW.  Will continue to follow and assist as needed  [name], LCSW Clinical Social Worker [facility] Desk#: [phone] Cell#: [phone]

## 2021-07-01 ENCOUNTER — Telehealth (HOSPITAL_COMMUNITY): Payer: Self-pay | Admitting: Licensed Clinical Social Worker

## 2021-07-01 NOTE — Telephone Encounter (Signed)
Received email from [facility] that pt has officially been approved for apartment.  Hopeful that repairs will be completed by the end of the month but unsure at this time.  Asked that CSW call Regenia Skeeter Monday to set up an appt to go over move in.  Pt parole officer reached out to [facility] where [facility] for evaluation and they confirm we should be able to organize outside visit with supervision from parole officer if pt needs to leave to secure housing.  Pt case worker at [facility] name is Uvaldo Rising, [phone] email: [email or web address]  CSW will continue to follow and assist as needed  [name], LCSW Clinical Social Worker [facility] Desk#: [phone] Cell#: [phone]

## 2021-07-02 DIAGNOSIS — J9611 Chronic respiratory failure with hypoxia: Secondary | ICD-10-CM | POA: Diagnosis not present

## 2021-07-02 DIAGNOSIS — I5032 Chronic diastolic (congestive) heart failure: Secondary | ICD-10-CM | POA: Diagnosis not present

## 2021-07-13 DIAGNOSIS — J9611 Chronic respiratory failure with hypoxia: Secondary | ICD-10-CM | POA: Diagnosis not present

## 2021-07-15 ENCOUNTER — Telehealth (HOSPITAL_COMMUNITY): Payer: Self-pay | Admitting: Licensed Clinical Social Worker

## 2021-07-15 NOTE — Telephone Encounter (Signed)
CSW spoke with Raynelle Fanning at [facility] to discuss pt move in.  Pt will be able to move in on November 10th the day of DC from [facility].  Currently appt time set for 10:30am.  Pt will need to bring money order for $261 for his security deposit as well as first months rent- they will pro-rate his rent for November and send me this number- he will also need a money order for this amount.  Pt will also need to have gas and electric pre set up prior to move in date.  CSW called pt case worker, Ronaldo Miyamoto [phone]) at [facility] to discuss how to accomplish above in time for move out.  CSW will continue to follow and assist as needed  [name], LCSW Clinical Social Worker [facility] Desk#: [phone] Cell#: [phone]

## 2021-07-15 NOTE — Telephone Encounter (Signed)
CSW received call back from pt Case worker- we set up time to work with pt on setting up utilities at [employer].  Pt currently has -500 dollars in his bank account but gets paid in a couple of days so should have slightly over $300 available after that- we will try to get his utilities set up with this amount.  CSW will plan to assist with security deposit and first month rent from patient care fund  Will continue to follow and assist as needed  [name], LCSW Clinical Social Worker [facility] Desk#: [phone] Cell#: [phone]

## 2021-07-16 ENCOUNTER — Telehealth (HOSPITAL_COMMUNITY): Payer: Self-pay | Admitting: Licensed Clinical Social Worker

## 2021-07-16 NOTE — Telephone Encounter (Signed)
CSW spoke with pt and pt [facility] case worker to help set up turn on dates for electricity and gas for his new apartment.  Able to set up turn on for [employer] on 07/28/2021 Turn on for [employer] on 07/29/2021  Pt will work on getting ride from rehab on 07/29/2021 so that he is able to make it to [facility] by 10:30am to sign the lease.  Will continue to follow and assist as needed  [name], LCSW Clinical Social Worker [facility] Desk#: [phone] Cell#: [phone]

## 2021-07-20 ENCOUNTER — Ambulatory Visit (HOSPITAL_COMMUNITY)
Admission: EM | Admit: 2021-07-20 | Discharge: 2021-07-20 | Disposition: A | Discharge status: Home / Self Care | Payer: Medicaid Other | Attending: Physician Assistant | Admitting: Physician Assistant

## 2021-07-20 ENCOUNTER — Ambulatory Visit (INDEPENDENT_AMBULATORY_CARE_PROVIDER_SITE_OTHER): Payer: Medicaid Other

## 2021-07-20 ENCOUNTER — Other Ambulatory Visit: Payer: Self-pay

## 2021-07-20 ENCOUNTER — Emergency Department (HOSPITAL_COMMUNITY)
Admission: EM | Admit: 2021-07-20 | Discharge: 2021-07-20 | Discharge status: Against Medical Advice | Payer: Medicaid Other

## 2021-07-20 ENCOUNTER — Encounter (HOSPITAL_COMMUNITY): Payer: Self-pay

## 2021-07-20 DIAGNOSIS — Z7901 Long term (current) use of anticoagulants: Secondary | ICD-10-CM | POA: Insufficient documentation

## 2021-07-20 DIAGNOSIS — J441 Chronic obstructive pulmonary disease with (acute) exacerbation: Secondary | ICD-10-CM | POA: Insufficient documentation

## 2021-07-20 DIAGNOSIS — Z87891 Personal history of nicotine dependence: Secondary | ICD-10-CM | POA: Insufficient documentation

## 2021-07-20 DIAGNOSIS — E119 Type 2 diabetes mellitus without complications: Secondary | ICD-10-CM | POA: Insufficient documentation

## 2021-07-20 DIAGNOSIS — Z20822 Contact with and (suspected) exposure to covid-19: Secondary | ICD-10-CM | POA: Insufficient documentation

## 2021-07-20 DIAGNOSIS — R0689 Other abnormalities of breathing: Secondary | ICD-10-CM | POA: Diagnosis not present

## 2021-07-20 DIAGNOSIS — I10 Essential (primary) hypertension: Secondary | ICD-10-CM

## 2021-07-20 DIAGNOSIS — Z7984 Long term (current) use of oral hypoglycemic drugs: Secondary | ICD-10-CM | POA: Insufficient documentation

## 2021-07-20 DIAGNOSIS — J449 Chronic obstructive pulmonary disease, unspecified: Secondary | ICD-10-CM | POA: Diagnosis not present

## 2021-07-20 DIAGNOSIS — Z86711 Personal history of pulmonary embolism: Secondary | ICD-10-CM | POA: Insufficient documentation

## 2021-07-20 DIAGNOSIS — Z79899 Other long term (current) drug therapy: Secondary | ICD-10-CM | POA: Insufficient documentation

## 2021-07-20 DIAGNOSIS — R051 Acute cough: Secondary | ICD-10-CM | POA: Diagnosis present

## 2021-07-20 DIAGNOSIS — I517 Cardiomegaly: Secondary | ICD-10-CM | POA: Diagnosis not present

## 2021-07-20 DIAGNOSIS — R0602 Shortness of breath: Secondary | ICD-10-CM | POA: Insufficient documentation

## 2021-07-20 DIAGNOSIS — J9811 Atelectasis: Secondary | ICD-10-CM | POA: Diagnosis not present

## 2021-07-20 DIAGNOSIS — R001 Bradycardia, unspecified: Secondary | ICD-10-CM | POA: Diagnosis not present

## 2021-07-20 DIAGNOSIS — I110 Hypertensive heart disease with heart failure: Secondary | ICD-10-CM | POA: Insufficient documentation

## 2021-07-20 DIAGNOSIS — I213 ST elevation (STEMI) myocardial infarction of unspecified site: Secondary | ICD-10-CM | POA: Diagnosis not present

## 2021-07-20 DIAGNOSIS — R0902 Hypoxemia: Secondary | ICD-10-CM | POA: Diagnosis not present

## 2021-07-20 LAB — RESPIRATORY PANEL BY PCR
Adenovirus: NOT DETECTED
Bordetella Parapertussis: NOT DETECTED
Bordetella pertussis: NOT DETECTED
Chlamydophila pneumoniae: NOT DETECTED
Coronavirus 229E: NOT DETECTED
Coronavirus HKU1: NOT DETECTED
Coronavirus NL63: NOT DETECTED
Coronavirus OC43: NOT DETECTED
Influenza A: NOT DETECTED
Influenza B: NOT DETECTED
Metapneumovirus: NOT DETECTED
Mycoplasma pneumoniae: NOT DETECTED
Parainfluenza Virus 1: NOT DETECTED
Parainfluenza Virus 2: DETECTED — AB
Parainfluenza Virus 3: NOT DETECTED
Parainfluenza Virus 4: NOT DETECTED
Respiratory Syncytial Virus: NOT DETECTED
Rhinovirus / Enterovirus: NOT DETECTED

## 2021-07-20 MED ORDER — PREDNISONE 10 MG PO TABS: 20.0000 mg | ORAL_TABLET | Freq: Every day | ORAL | 0 refills | Status: AC

## 2021-07-20 MED ORDER — DOXYCYCLINE HYCLATE 100 MG PO CAPS: 100.0000 mg | ORAL_CAPSULE | Freq: Two times a day (BID) | ORAL | 0 refills | Status: DC

## 2021-07-20 NOTE — ED Triage Notes (Signed)
Pt via EMS from [facility] for shob x 3 days. SpO2 on normal home 2L 97%. Abnormal EKG with EMS, but does not meet any activation criteria per EDP. Denies chest pain.

## 2021-07-20 NOTE — ED Notes (Signed)
Pt called, no response

## 2021-07-20 NOTE — ED Provider Notes (Signed)
[place]    CSN: [id number] Arrival date & time: 07/20/21  1804      History   Chief Complaint Chief Complaint  Patient presents with   Cough   Shortness of Breath    HPI Benjamin [name] is a 55 y.o. male.   Patient presents today with a several day history of worsening shortness of breath.  He has a history of COPD and states current symptoms are similar to previous episodes of this condition.  He reports that shortness of breath worsened and he called an ambulance which took him to the [facility] emergency room where he waited for at least 8 hours.  He then could not wait any longer and so went home and took a nap before presenting to urgent care.  He is not interested in returning to the emergency room due to concern for weight.  He does have oxygen available but has not been using this recently.  He denies any fever, chest pain, body aches, headache, nasal congestion.  He denies any known sick contacts but is around several people.  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  He denies any blood in stool or dark stools.  He reports increased feeding production that is thick and colored.  Denies any recent antibiotic use.  No labs or imaging were ordered in the emergency room.  Blood pressure is elevated today.  Patient is not presenting going to the emergency room despite associated shortness of breath as he believes shortness of breath is related to COPD.  Denies any headache, chest pain, vision changes, dizziness.  He is on hydralazine and Cardizem; reports he has missed several doses of hydralazine because he was in the emergency room.   Past Medical History:  Diagnosis Date   A-fib ([place])    Anemia 05/29/2019   CHF (congestive heart failure) (HCC)    Chronic respiratory failure (HCC)    Diabetes mellitus without complication (HCC)    Dyspnea    Elevated troponin 02/06/2019   Hypertension    Obesity     Pulmonary embolism (HCC)    Rectal bleeding 05/29/2019   Sleep apnea     Patient Active Problem List   Diagnosis Date Noted   Atrial fibrillation with RVR ([place]) 01/08/2021   Acute respiratory failure ([place]) 12/24/2020   Obstructive sleep apnea treated with BiPAP 12/24/2020   Polysubstance abuse ([place]) 08/20/2020   Alcohol abuse 08/20/2020   Atrial fibrillation with rapid ventricular response ([place]) 08/19/2020   Obesity hypoventilation syndrome ([place]) 06/12/2020   Healthcare maintenance 06/12/2020   Acute on chronic diastolic heart failure ([place]) 02/24/2020   Diabetic neuropathy ([place]) 11/20/2019   Coagulation disorder ([place]) 11/20/2019   Acute on chronic diastolic CHF (congestive heart failure), NYHA class 3 ([place]) 07/30/2019   Diabetes mellitus without complication ([place]) 42/70/6237   Hypertensive emergency    BRBPR (bright red blood per rectum) 06/12/2019   Hypertension    CHF (congestive heart failure) ([place])    Sleep apnea    Chronic respiratory failure ([place])    Hypokalemia 05/29/2019   GI bleed 05/29/2019   Anemia 05/28/2019   Iron deficiency anemia 03/11/2019   OSA on CPAP 03/11/2019   HLD (hyperlipidemia) 03/11/2019   Elevated troponin 03/11/2019   GERD (gastroesophageal reflux disease) 03/11/2019   Rectal bleeding 02/20/2019   Dyspnea 02/06/2019   COVID-19 virus infection 02/06/2019   Bilateral lower extremity edema    Morbid  obesity with BMI of 50.0-59.9, adult (HCC)    PAF (paroxysmal atrial fibrillation) ([place])    PE (pulmonary thromboembolism) ([place]) 01/21/2019   Hypertensive urgency 01/21/2019   Diabetes mellitus type 2 in obese ([place]) 01/21/2019    Past Surgical History:  Procedure Laterality Date   CARDIOVERSION N/A 08/21/2020   Procedure: CARDIOVERSION;  Surgeon: Larey Dresser, MD;  Location: [place];  Service: Cardiovascular;  Laterality: N/A;   COLONOSCOPY WITH PROPOFOL Left 02/25/2019   Procedure: COLONOSCOPY WITH PROPOFOL;  Surgeon: Arta Silence, MD;   Location: [place];  Service: Endoscopy;  Laterality: Left;   ESOPHAGOGASTRODUODENOSCOPY (EGD) WITH PROPOFOL Left 02/25/2019   Procedure: ESOPHAGOGASTRODUODENOSCOPY (EGD) WITH PROPOFOL;  Surgeon: Arta Silence, MD;  Location: [facility] ENDOSCOPY;  Service: Endoscopy;  Laterality: Left;   ESOPHAGOGASTRODUODENOSCOPY (EGD) WITH PROPOFOL N/A 06/12/2019   Procedure: ESOPHAGOGASTRODUODENOSCOPY (EGD) WITH PROPOFOL;  Surgeon: Clarene Essex, MD;  Location: [place];  Service: Gastroenterology;  Laterality: N/A;   FLEXIBLE SIGMOIDOSCOPY N/A 06/12/2019   Procedure: FLEXIBLE SIGMOIDOSCOPY;  Surgeon: Clarene Essex, MD;  Location: [place];  Service: Gastroenterology;  Laterality: N/A;   HEMORRHOID BANDING  05/2019   NO PAST SURGERIES     RIGHT HEART CATH N/A 04/17/2019   Procedure: RIGHT HEART CATH;  Surgeon: Larey Dresser, MD;  Location: [address] CV LAB;  Service: Cardiovascular;  Laterality: N/A;   TEE WITHOUT CARDIOVERSION N/A 08/21/2020   Procedure: TRANSESOPHAGEAL ECHOCARDIOGRAM (TEE);  Surgeon: Larey Dresser, MD;  Location: [facility] ENDOSCOPY;  Service: Cardiovascular;  Laterality: N/A;       Home Medications    Prior to Admission medications   Medication Sig Start Date End Date Taking? Authorizing Provider  doxycycline (VIBRAMYCIN) 100 MG capsule Take 1 capsule (100 mg total) by mouth 2 (two) times daily. 07/20/21  Yes Jesselyn Rask K, PA-C  hydrALAZINE (APRESOLINE) 50 MG tablet TAKE ONE TABLET BY MOUTH THREE TIMES A DAY. (NOON+PM+BEDTIME) 02/08/21   Clegg, Amy D, NP  predniSONE (DELTASONE) 10 MG tablet Take 2 tablets (20 mg total) by mouth daily for 4 days. 07/20/21 07/24/21 Yes Jazziel Fitzsimmons K, PA-C  atorvastatin (LIPITOR) 40 MG tablet Take 1 tablet (40 mg total) by mouth at bedtime. 09/22/20 12/21/20  [name], NP  Blood Glucose Monitoring Suppl (TRUE METRIX METER) w/Device KIT Use as instructed. Check blood glucose level by fingerstick twice per day. 03/18/19   [name], NP  Blood  Pressure Monitor DEVI Please provide patient with insurance approved blood pressure device with L-XL cuff. BMI 55 Patient taking differently: 1 each by Other route See admin instructions. Please provide patient with insurance approved blood pressure device with L-XL cuff. BMI 55 10/04/19   [name], NP  carboxymethylcellulose (REFRESH PLUS) 0.5 % SOLN Place 1 drop into both eyes daily.    [provider]  diltiazem (CARDIZEM CD) 360 MG 24 hr capsule Take 1 capsule (360 mg total) by mouth daily. 02/08/21   Clegg, Amy D, NP  doxazosin (CARDURA) 2 MG tablet TAKE 1 TABLET (2 MG TOTAL) BY MOUTH DAILY(NOON) 06/14/21   Lyda Jester M, PA-C  ferrous sulfate (FEROSUL) 325 (65 FE) MG tablet TAKE 1 TABLET (325 MG TOTAL) BY MOUTH DAILY (NOON) 05/04/21   Newlin, Enobong, MD  glucose blood (TRUE METRIX BLOOD GLUCOSE TEST) test strip Use as instructed. Check blood glucose level by fingerstick twice per day. 09/22/20   [name], NP  losartan (COZAAR) 50 MG tablet Take 1 tablet (50 mg total) by mouth daily.  01/11/21 04/11/21  Larey Dresser, MD  Lumateperone Tosylate (CAPLYTA) 42 MG CAPS Take 42 mg by mouth at bedtime. 10/09/20   Bensimhon, Shaune Pascal, MD  MAGNESIUM-OXIDE 400 (240 Mg) MG tablet TAKE 2 TABLETS BY MOUTH EVERY MORNING AND 1 TABLET EVERY EVENING ( NOON+PM+BEDTIME) 03/30/21   Larey Dresser, MD  metFORMIN (GLUCOPHAGE) 500 MG tablet TAKE ONE TABLET BY MOUTH TWICE DAILY WITH A MEAL (NOON+BEDTIME) 03/30/21   [name], NP  Misc. Devices MISC Requires O2 @ 3L/min continuously via nasal canula and home fill system,. Patient taking differently: 1 each by Other route See admin instructions. Requires O2 @ 3L/min continuously via nasal canula and home fill system,. 07/03/19   [name], NP  Misc. Devices MISC Please provide Mr. Fedora with insurance approved portable O2 concentrator ICD 10 J96.11 Z99.81 Patient taking differently: 1 each by Other route See admin instructions.  Please provide Mr. Bartnik with insurance approved portable O2 concentrator ICD 10 J96.11 Z99.81 08/27/19   [name], NP  Misc. Devices MISC Please provide patient with insurance approved LARGE right hand splint ICD 10 G56.01 11/16/20   [name], NP  Misc. Devices MISC Please provide patient wit insurance approved diabetic custom molded shoes (QTY 2) and custom molded multi density inserts (1 for each shoe). 01/10/21   [name], NP  omeprazole (PRILOSEC) 20 MG capsule TAKE 1 CAPSULE 2 (TWO) TIMES DAILY BEFORE A MEAL. TAKE FIRST CAPSULE 30 MIN PRIOR TO EATING OR TAKING OTHER MEDICATIONS. (AM+BEDTIME) 06/14/21   [name], NP  potassium chloride (KLOR-CON) 10 MEQ tablet TAKE 8 TABLETS (80 MEQ TOTAL) BY MOUTH DAILY. 02/10/21   Bensimhon, Shaune Pascal, MD  PROVENTIL HFA 108 (90 Base) MCG/ACT inhaler INHALE 2 PUFFS BY MOUTH EVERY 6 (SIX) HOURS AS NEEDED FOR WHEEZING OR SHORTNESS OF BREATH. Patient taking differently: Inhale 2 puffs into the lungs every 6 (six) hours as needed for wheezing or shortness of breath. 09/22/20   [name], NP  rivaroxaban (XARELTO) 20 MG TABS tablet TAKE 1 TABLET BY MOUTH DAILY WITH SUPPER (BEDTIME) 06/14/21   Clegg, Amy D, NP  spironolactone (ALDACTONE) 50 MG tablet TAKE 1 TABLET (50 MG TOTAL) BY MOUTH DAILY (NOON) 01/13/21   Bensimhon, Shaune Pascal, MD  STOOL SOFTENER 100 MG capsule TAKE 1 CAPSULE BY MOUTH 2 (TWO) TIMES DAILY (NOON+BEDTIME) 06/14/21   [name], NP  torsemide (DEMADEX) 20 MG tablet TAKE 3 TABLETS (60 MG TOTAL) BY MOUTH 2 (TWO) TIMES DAILY (NOON+BEDTIME) 05/05/21   Clegg, Amy D, NP  TRUEplus Lancets 28G MISC Use as instructed. Check blood glucose level by fingerstick twice per day. 09/22/20   [name], NP    Family History Family History  Problem Relation Age of Onset   Hypertension Mother    Diabetes Mother     Social History Social History   Tobacco Use   Smoking status: Former   Smokeless tobacco: Never   Tobacco  comments:    smoked weed in the past  Vaping Use   Vaping Use: Never used  Substance Use Topics   Alcohol use: Not Currently   Drug use: Yes    Types: Marijuana, Cocaine     Allergies   Patient has no known allergies.   Review of Systems Review of Systems  Constitutional:  Positive for activity change. Negative for appetite change, fatigue and fever.  HENT:  Negative for congestion, sinus pressure, sneezing and sore throat.  Respiratory:  Positive for cough and shortness of breath.   Cardiovascular:  Negative for chest pain and palpitations.  Gastrointestinal:  Negative for abdominal pain, diarrhea, nausea and vomiting.  Neurological:  Negative for dizziness, light-headedness and headaches.    Physical Exam Triage Vital Signs ED Triage Vitals  Enc Vitals Group     BP 07/20/21 1913 (!) 178/109     Pulse Rate 07/20/21 1913 91     Resp 07/20/21 1913 19     Temp 07/20/21 1913 98.1 F (36.7 C)     Temp Source 07/20/21 1913 Oral     SpO2 07/20/21 1913 94 %     Weight --      Height --      Head Circumference --      Peak Flow --      Pain Score 07/20/21 1911 10     Pain Loc --      Pain Edu? --      Excl. in [place]? --    No data found.  Updated Vital Signs BP (!) 164/66 (BP Location: Right Arm)   Pulse 91   Temp 98.1 F (36.7 C) (Oral)   Resp 19   SpO2 94%   Visual Acuity Right Eye Distance:   Left Eye Distance:   Bilateral Distance:    Right Eye Near:   Left Eye Near:    Bilateral Near:     Physical Exam Vitals reviewed.  Constitutional:      General: He is awake.     Appearance: Normal appearance. He is well-developed. He is not ill-appearing.     Comments: Very pleasant male appears stated age in no acute distress  HENT:     Head: Normocephalic and atraumatic.     Right Ear: Tympanic membrane, ear canal and external ear normal. Tympanic membrane is not erythematous or bulging.     Left Ear: Tympanic membrane, ear canal and external ear normal.  Tympanic membrane is not erythematous or bulging.     Nose: Nose normal.     Mouth/Throat:     Pharynx: Uvula midline. No oropharyngeal exudate or posterior oropharyngeal erythema.  Cardiovascular:     Rate and Rhythm: Normal rate. Rhythm irregularly irregular.     Heart sounds: Normal heart sounds, S1 normal and S2 normal. No murmur heard. Pulmonary:     Effort: Pulmonary effort is normal. No accessory muscle usage or respiratory distress.     Breath sounds: No stridor. Examination of the right-lower field reveals decreased breath sounds. Examination of the left-lower field reveals decreased breath sounds. Decreased breath sounds present. No wheezing, rhonchi or rales.     Comments: Decreased aeration bilateral bases Abdominal:     General: Bowel sounds are normal.     Palpations: Abdomen is soft.     Tenderness: There is no abdominal tenderness.  Neurological:     Mental Status: He is alert.  Psychiatric:        Behavior: Behavior is cooperative.     UC Treatments / Results  Labs (all labs ordered are listed, but only abnormal results are displayed) Labs Reviewed  RESPIRATORY PANEL BY PCR  SARS CORONAVIRUS 2 (TAT 6-24 HRS)    EKG   Radiology DG Chest 2 View  Result Date: 07/20/2021 CLINICAL DATA:  Shortness of breath.  COPD. EXAM: CHEST - 2 VIEW COMPARISON:  Radiograph 01/08/2021 FINDINGS: Stable mild cardiomegaly. Aortic atherosclerosis and tortuosity. Chronic hyperinflation and bronchial thickening. Subsegmental atelectasis at the bases. No confluent airspace  disease. No pleural fluid or pneumothorax. No acute osseous abnormalities are seen. IMPRESSION: 1. Stable mild cardiomegaly. 2. Stable hyperinflation and bronchial thickening consistent with COPD. 3. Bibasilar atelectasis.  No other acute findings. Electronically Signed   By: Keith Rake M.D.   On: 07/20/2021 20:00    Procedures Procedures (including critical care time)  Medications Ordered in UC Medications -  No data to display  Initial Impression / Assessment and Plan / UC Course  I have reviewed the triage vital signs and the nursing notes.  Pertinent labs & imaging results that were available during my care of the patient were reviewed by me and considered in my medical decision making (see chart for details).     X-ray consistent with COPD without acute abnormalities.  Given concern for worsening COPD exacerbation due to increased pain production will start antibiotics; given doxycycline 100 mg twice daily for 10 days.  Patient was prescribed a low-dose of prednisone due to history of diabetes with instruction to take NSAIDs with this medication.  Discussed this can cause hyperglycemia and he is to avoid carbohydrates and drink plenty of fluid.  He is to monitor his blood sugar if this is persistently above 200 he needs to be reevaluated.  He does have supplemental oxygen at home and was instructed to use this as previously prescribed.  He is to monitor his oxygenation and if this drops under 93% or he requires an increase amount of oxygen he needs to go to the emergency room.  He is to take all inhaler medications as prescribed.  Viral testing was obtained-results pending.  He is to remain in isolation until COVID test result is received.  Discussed alarm symptoms that warrant going to the ER.  Strict return precautions given to which he expressed understanding.  Blood pressure is elevated today.  I believe this is related to missing doses of hydralazine.  Patient was encouraged to go home and take hydralazine.  We did discuss that the safest thing to do would be to go to the emergency room given elevated blood pressure with shortness of breath patient declined to do this due to previous weight in the ER.  He was instructed to avoid decongestants, sodium, caffeine.  He is to monitor his blood pressure at home and if this remains elevated he needs to go to the ER.  He is to follow-up with his primary care  provider as soon as possible for recheck.  Final Clinical Impressions(s) / UC Diagnoses   Final diagnoses:  COPD exacerbation (HCC)  Acute cough  Shortness of breath  Elevated blood pressure reading in office with diagnosis of hypertension     Discharge Instructions      As we discussed, I think the safest thing to do is to go to the emergency room.  We will contact you if any of your viral testing is positive.  Please start antibiotics to cover for COPD exacerbation (doxycycline 100 mg twice daily for 10 days).  I have also called in prednisone 20 mg.  This can raise her blood sugar so please avoid carbohydrates and drink plenty of fluid.  Monitor blood sugar if this is persistently elevated you need to be reevaluated.  As we discussed, your blood pressure is very elevated.  I believe this is because you have missed doses of hydralazine.  Please take this medication when you get home and monitor your blood pressure.  If this remains elevated and you develop worsening shortness of breath, chest  pain, headache, dizziness you need to go to the ER as we discussed.     ED Prescriptions     Medication Sig Dispense Auth. Provider   doxycycline (VIBRAMYCIN) 100 MG capsule Take 1 capsule (100 mg total) by mouth 2 (two) times daily. 20 capsule Javen Ridings K, PA-C   predniSONE (DELTASONE) 10 MG tablet Take 2 tablets (20 mg total) by mouth daily for 4 days. 8 tablet Kaydence Menard, Derry Skill, PA-C      PDMP not reviewed this encounter.   Terrilee Croak, PA-C 07/20/21 2029

## 2021-07-20 NOTE — Discharge Instructions (Signed)
As we discussed, I think the safest thing to do is to go to the emergency room.  We will contact you if any of your viral testing is positive.  Please start antibiotics to cover for COPD exacerbation (doxycycline 100 mg twice daily for 10 days).  I have also called in prednisone 20 mg.  This can raise her blood sugar so please avoid carbohydrates and drink plenty of fluid.  Monitor blood sugar if this is persistently elevated you need to be reevaluated.  As we discussed, your blood pressure is very elevated.  I believe this is because you have missed doses of hydralazine.  Please take this medication when you get home and monitor your blood pressure.  If this remains elevated and you develop worsening shortness of breath, chest pain, headache, dizziness you need to go to the ER as we discussed.

## 2021-07-20 NOTE — ED Triage Notes (Signed)
Pt presents with SOB. States he has  COPD and feels tired.

## 2021-07-20 NOTE — ED Notes (Signed)
Pt called for vitals and EKG, no response.

## 2021-07-21 ENCOUNTER — Ambulatory Visit (HOSPITAL_COMMUNITY)
Admission: EM | Admit: 2021-07-21 | Discharge: 2021-07-21 | Discharge status: Against Medical Advice | Payer: Medicaid Other

## 2021-07-21 ENCOUNTER — Telehealth (HOSPITAL_COMMUNITY): Payer: Self-pay | Admitting: Licensed Clinical Social Worker

## 2021-07-21 ENCOUNTER — Other Ambulatory Visit: Payer: Self-pay

## 2021-07-21 DIAGNOSIS — Z5321 Procedure and treatment not carried out due to patient leaving prior to being seen by health care provider: Secondary | ICD-10-CM

## 2021-07-21 LAB — SARS CORONAVIRUS 2 (TAT 6-24 HRS): SARS Coronavirus 2: NEGATIVE

## 2021-07-21 NOTE — Telephone Encounter (Signed)
CSW received call from pt that he was sent to ED by [facility] but left and went to Urgent Care as ED wait was too long.  Was prescribed antibiotics and ended up staying with his sister that night.  [facility] documented him as leaving AMA as he did not return- CSW spoke with case worker and sent medical documentation to prove what he was evaluated for and what is wrong with him- they are now telling pt he is being medically discharged as they cannot handle his needs there.  Pt will plan to stay with his sister while he recovers- hoping not to return to the boarding house he has been staying at prior to move in to new appt on November 10th.  CSW will continue to follow and assist as needed  [name], LCSW Clinical Social Worker [facility] Desk#: [phone] Cell#: [phone]

## 2021-07-21 NOTE — ED Notes (Signed)
Per registration pt left

## 2021-07-21 NOTE — ED Triage Notes (Signed)
Pt reports had chest pains for 4-5 days and SOB. Reports seen here last night for same complaints and not any better. HX COPD. Went to ED while ago but left due to wait times. Pt also c/o congestion and diarrhea.  Pt on phone taking to someone on phone.

## 2021-07-22 ENCOUNTER — Telehealth (HOSPITAL_COMMUNITY): Payer: Self-pay | Admitting: Licensed Clinical Social Worker

## 2021-07-22 NOTE — Telephone Encounter (Signed)
Referral placed for pt to get furniture through [employer].  Pt informed that he will be responsible for arranging pick up of furniture and moving costs.  Will continue to follow and assist as needed  [name], LCSW Clinical Social Worker [facility] Desk#: [phone] Cell#: [phone]

## 2021-07-24 DIAGNOSIS — J9611 Chronic respiratory failure with hypoxia: Secondary | ICD-10-CM | POA: Diagnosis not present

## 2021-07-24 DIAGNOSIS — I5032 Chronic diastolic (congestive) heart failure: Secondary | ICD-10-CM | POA: Diagnosis not present

## 2021-07-27 ENCOUNTER — Telehealth (HOSPITAL_COMMUNITY): Payer: Self-pay

## 2021-07-27 NOTE — Telephone Encounter (Signed)
I called pt to check in to f/u on how he is doing. He is staying with his sister right now, he still feels bad from the flu diagnosis from last week, but getting a little better each day.  He denies needing anything at the moment.  He reports having all his meds.  Will f/u next week.   Kerry Hough, EMT-Paramedic  07/27/21

## 2021-07-29 ENCOUNTER — Telehealth: Payer: Self-pay | Admitting: Licensed Clinical Social Worker

## 2021-07-29 NOTE — Telephone Encounter (Signed)
CSW met with pt at [facility] and provided them with security deposit and first months rent- pt officially moving in today- address updated in chart  CSW also dropped off $35 referral fee for [employer]- they will call pt to schedule shopping appt.  Pt will be responsible for finding movers and getting furniture into apartment.  Will continue to follow and assist as needed  [name], [facility] Desk#: [phone] Cell#: [phone]

## 2021-08-02 DIAGNOSIS — J9611 Chronic respiratory failure with hypoxia: Secondary | ICD-10-CM | POA: Diagnosis not present

## 2021-08-02 DIAGNOSIS — I5032 Chronic diastolic (congestive) heart failure: Secondary | ICD-10-CM | POA: Diagnosis not present

## 2021-08-03 ENCOUNTER — Telehealth (HOSPITAL_COMMUNITY): Payer: Self-pay | Admitting: Licensed Clinical Social Worker

## 2021-08-03 NOTE — Progress Notes (Signed)
Heart and Vascular Care Navigation  08/03/2021  ZAKERY NORMINGTON 02-07-1966 [id number]  Reason for Referral: Pt called requesting help with completing food stamp re certification which is due today.    Engaged with patient face to face for follow up visit for Heart and Vascular Care Coordination.                                                                                                   Assessment:   CSW met with pt in clinic and assisted in completing food stamp re certification paperwork which was due today- faxed in to DSS- received fax confirmation.  CSW and pt call DSS to inform paperwork being sent in and have pt address changed in their system.  CSW provided pt with number for [employer] to change address with them.    Provided pt with number to [employer] to go get free clothing.  Provided with number to local moving company to get quote for getting [employer].                                   Pt also missed last clinic appt so CSW assisted in scheduling new appt.  HRT/VAS Care Coordination     Patients Home Cardiology Office [facility]   Outpatient Care Team Community Paramedicine; Education officer, museum; Exercise Physiologist; RN Care Manager   Community Paramedic Name: [name] [phone]   Exercise Physiologist Name: Landis Martins [phone]   RN Care Manager Name: [facility] case worker- Baxter Flattery [phone]   Social Worker Name: [name] [phone]   Living arrangements for the past 2 months Single Family Home   Lives with: Significant Other   Patient Current Insurance Coverage Medicaid   Patient Has Concern With Paying Medical Bills No   Does Patient Have Prescription Coverage? Yes   Home Assistive Devices/Equipment None   DME Agency [employer]   Current home services DME       Social History:                                                                             SDOH Screenings   Alcohol Screen: Not on file   Depression (PHQ2-9): Low Risk    PHQ-2 Score: 0  Financial Resource Strain: High Risk   Difficulty of Paying Living Expenses: Hard  Food Insecurity: No Food Insecurity   Worried About Charity fundraiser in the Last Year: Never true   Ran Out of Food in the Last Year: Never true  Housing: Medium Risk   Last Housing Risk Score: 1  Physical Activity: Not on file  Social Connections: Not on file  Stress: Not on file  Tobacco Use: Medium Risk  Smoking Tobacco Use: Former   Smokeless Tobacco Use: Never   Passive Exposure: Not on file  Transportation Needs: No Transportation Needs   Lack of Transportation (Medical): No   Lack of Transportation (Non-Medical): No    SDOH Interventions: Financial Resources:    N/a  Food Insecurity:  Food Insecurity Interventions: Assist with [employer]  Housing Insecurity:   N/a now in new apartment with $175 rent so no concerns with housing at this time  Transportation:    N/a   Follow-up plan:    Pt to follow up on calling agencies that CSW provided numbers for.  CSW will continue to follow and assist as needed  [name], [facility] Desk#: [phone] Cell#: [phone]

## 2021-08-05 ENCOUNTER — Other Ambulatory Visit (HOSPITAL_COMMUNITY): Payer: Self-pay | Admitting: Cardiology

## 2021-08-05 DIAGNOSIS — I5032 Chronic diastolic (congestive) heart failure: Secondary | ICD-10-CM

## 2021-08-06 DIAGNOSIS — Z634 Disappearance and death of family member: Secondary | ICD-10-CM | POA: Diagnosis not present

## 2021-08-09 DIAGNOSIS — Z634 Disappearance and death of family member: Secondary | ICD-10-CM | POA: Diagnosis not present

## 2021-08-10 DIAGNOSIS — Z634 Disappearance and death of family member: Secondary | ICD-10-CM | POA: Diagnosis not present

## 2021-08-11 ENCOUNTER — Other Ambulatory Visit: Payer: Self-pay | Admitting: Nurse Practitioner

## 2021-08-11 ENCOUNTER — Other Ambulatory Visit (HOSPITAL_COMMUNITY): Payer: Self-pay | Admitting: Internal Medicine

## 2021-08-11 ENCOUNTER — Telehealth (HOSPITAL_COMMUNITY): Payer: Self-pay

## 2021-08-11 ENCOUNTER — Other Ambulatory Visit (HOSPITAL_COMMUNITY): Payer: Self-pay | Admitting: Cardiology

## 2021-08-11 DIAGNOSIS — Z634 Disappearance and death of family member: Secondary | ICD-10-CM | POA: Diagnosis not present

## 2021-08-11 DIAGNOSIS — E876 Hypokalemia: Secondary | ICD-10-CM

## 2021-08-11 DIAGNOSIS — I10 Essential (primary) hypertension: Secondary | ICD-10-CM

## 2021-08-11 NOTE — Telephone Encounter (Signed)
Requested medication (s) are due for refill today: Yes  Requested medication (s) are on the active medication list: Yes  Last refill:  03/30/21 #180/0RF  Future visit scheduled: NO  Notes to clinic:  Unable to refill per protocol, appointment needed.      Requested Prescriptions  Pending Prescriptions Disp Refills   metFORMIN (GLUCOPHAGE) 500 MG tablet [Pharmacy Med Name: METFORMIN HCL 500 MG ORAL TABLET] 180 tablet 0    Sig: TAKE ONE TABLET BY MOUTH TWICE DAILY WITH A MEAL (NOON+BEDTIME)     Endocrinology:  Diabetes - Biguanides Failed - 08/11/2021  2:02 PM      Failed - Cr in normal range and within 360 days    Creatinine, Ser  Date Value Ref Range Status  01/21/2021 1.89 (H) 0.61 - 1.24 mg/dL Final   Creatinine, Urine  Date Value Ref Range Status  02/08/2019 226.67 mg/dL Final    Comment:    Performed at [facility], [place] [address], [place], [place] [address]          Failed - HBA1C is between 0 and 7.9 and within 180 days    Hgb A1c MFr Bld  Date Value Ref Range Status  12/21/2020 6.1 (H) 4.8 - 5.6 % Final    Comment:             Prediabetes: 5.7 - 6.4          Diabetes: >6.4          Glycemic control for adults with diabetes: <7.0           Failed - AA eGFR in normal range and within 360 days    GFR calc Af Amer  Date Value Ref Range Status  03/02/2020 >60 >60 mL/min Final   GFR, Estimated  Date Value Ref Range Status  01/21/2021 41 (L) >60 mL/min Final    Comment:    (NOTE) Calculated using the CKD-EPI Creatinine Equation (2021)           Failed - Valid encounter within last 6 months    Recent Outpatient Visits           7 months ago Essential hypertension   [place], [name] W, NP   9 months ago Wound of abdomen   [place], [name] W, NP   10 months ago Hospital discharge follow-up   [place], [name] W, NP    1 year ago Essential hypertension   [place], Vernia Buff, NP   1 year ago Diabetes mellitus type 2, uncontrolled, with complications [facility])   [place], Vernia Buff, NP

## 2021-08-11 NOTE — Telephone Encounter (Signed)
Pt called, unable to leave VM d/t mailbox full. Pt is due for an appt for medication refill request.

## 2021-08-11 NOTE — Telephone Encounter (Signed)
Pt contacted me stating he needs help contacting pharmacy to get his meds refilled.  [facility] to get his bubble packs filled and his bottles of potassium. Pt said he will be able to p/u when ready.   Kerry Hough, EMT-Paramedic  08/11/21

## 2021-08-16 DIAGNOSIS — Z634 Disappearance and death of family member: Secondary | ICD-10-CM | POA: Diagnosis not present

## 2021-08-17 ENCOUNTER — Telehealth (HOSPITAL_COMMUNITY): Payer: Self-pay

## 2021-08-17 DIAGNOSIS — Z634 Disappearance and death of family member: Secondary | ICD-10-CM | POA: Diagnosis not present

## 2021-08-19 DIAGNOSIS — Z634 Disappearance and death of family member: Secondary | ICD-10-CM | POA: Diagnosis not present

## 2021-08-19 NOTE — Telephone Encounter (Signed)
Pt contacted me to let me know he will be getting his furniture next week. Is settling in well and has enjoyed being in his new apt.  He asked I come out to see him next week.  He has all his meds.    Kerry Hough, EMT-Paramedic  08/17/21

## 2021-08-23 ENCOUNTER — Telehealth (HOSPITAL_COMMUNITY): Payer: Self-pay

## 2021-08-23 DIAGNOSIS — Z634 Disappearance and death of family member: Secondary | ICD-10-CM | POA: Diagnosis not present

## 2021-08-23 DIAGNOSIS — J9611 Chronic respiratory failure with hypoxia: Secondary | ICD-10-CM | POA: Diagnosis not present

## 2021-08-23 DIAGNOSIS — I5032 Chronic diastolic (congestive) heart failure: Secondary | ICD-10-CM | POA: Diagnosis not present

## 2021-08-24 ENCOUNTER — Telehealth (HOSPITAL_COMMUNITY): Payer: Self-pay

## 2021-08-24 NOTE — Telephone Encounter (Signed)
Called him again.  No answer. Unable to LVM. Mailbox full.   Kerry Hough, EMT-Paramedic  08/24/21

## 2021-08-24 NOTE — Telephone Encounter (Signed)
Attempted to call pt to sch home visit this week.  No answer.   Kerry Hough, EMT-Paramedic  08/24/21

## 2021-08-25 ENCOUNTER — Telehealth (HOSPITAL_COMMUNITY): Payer: Self-pay | Admitting: Licensed Clinical Social Worker

## 2021-08-25 DIAGNOSIS — Z634 Disappearance and death of family member: Secondary | ICD-10-CM | POA: Diagnosis not present

## 2021-08-25 NOTE — Telephone Encounter (Signed)
HF Paramedicine Team Based Care Meeting  HF MD- NA  HF NP - Amy Clegg NP-C   [facility] HF Paramedicine  Katie Andrei Mcc[place]  [facility] admit within the last 30 days for heart failure? no  Medications concerns? No on bubble packs  Transportation issues ? Has car so no concerns  Education needs? Not at this time  SDOH -nothing active  Eligible for discharge?  Yes- will plan to DC in next 1-2 weeks  [name], LCSW Clinical Social Worker [facility] Desk#: [phone] Cell#: [phone]

## 2021-08-26 DIAGNOSIS — Z634 Disappearance and death of family member: Secondary | ICD-10-CM | POA: Diagnosis not present

## 2021-08-27 DIAGNOSIS — Z634 Disappearance and death of family member: Secondary | ICD-10-CM | POA: Diagnosis not present

## 2021-08-30 ENCOUNTER — Telehealth (HOSPITAL_COMMUNITY): Payer: Self-pay

## 2021-08-30 DIAGNOSIS — Z634 Disappearance and death of family member: Secondary | ICD-10-CM | POA: Diagnosis not present

## 2021-08-30 NOTE — Telephone Encounter (Signed)
Called pt to sch home visit this week.  No answer.  LVM for him to return call.   Kerry Hough, EMT-Paramedic  08/30/21

## 2021-08-31 DIAGNOSIS — Z634 Disappearance and death of family member: Secondary | ICD-10-CM | POA: Diagnosis not present

## 2021-09-01 ENCOUNTER — Telehealth (HOSPITAL_COMMUNITY): Payer: Self-pay

## 2021-09-01 DIAGNOSIS — I5032 Chronic diastolic (congestive) heart failure: Secondary | ICD-10-CM | POA: Diagnosis not present

## 2021-09-01 DIAGNOSIS — J9611 Chronic respiratory failure with hypoxia: Secondary | ICD-10-CM | POA: Diagnosis not present

## 2021-09-01 DIAGNOSIS — Z634 Disappearance and death of family member: Secondary | ICD-10-CM | POA: Diagnosis not present

## 2021-09-01 NOTE — Telephone Encounter (Signed)
Finally got to reach pt-he said his phone was acting up and he got it fixed.  The plan is for me to go out next week and make home visit.    Kerry Hough, EMT-Paramedic  09/01/21

## 2021-09-06 DIAGNOSIS — Z634 Disappearance and death of family member: Secondary | ICD-10-CM | POA: Diagnosis not present

## 2021-09-08 DIAGNOSIS — Z634 Disappearance and death of family member: Secondary | ICD-10-CM | POA: Diagnosis not present

## 2021-09-09 ENCOUNTER — Other Ambulatory Visit: Payer: Self-pay | Admitting: Nurse Practitioner

## 2021-09-09 DIAGNOSIS — E782 Mixed hyperlipidemia: Secondary | ICD-10-CM

## 2021-09-09 NOTE — Telephone Encounter (Signed)
I called pt and made him an appt with [name], NP for 11/10/2021 at 2:10 for his 6 mo check/refills.    I gave him courtesy refills on the metformin and atorvastatin.

## 2021-09-12 DIAGNOSIS — J9611 Chronic respiratory failure with hypoxia: Secondary | ICD-10-CM | POA: Diagnosis not present

## 2021-09-13 DIAGNOSIS — Z634 Disappearance and death of family member: Secondary | ICD-10-CM | POA: Diagnosis not present

## 2021-09-14 DIAGNOSIS — Z634 Disappearance and death of family member: Secondary | ICD-10-CM | POA: Diagnosis not present

## 2021-09-15 DIAGNOSIS — Z634 Disappearance and death of family member: Secondary | ICD-10-CM | POA: Diagnosis not present

## 2021-09-17 ENCOUNTER — Other Ambulatory Visit (HOSPITAL_COMMUNITY): Payer: Medicaid Other

## 2021-09-20 ENCOUNTER — Telehealth (HOSPITAL_COMMUNITY): Payer: Self-pay

## 2021-09-20 DIAGNOSIS — Z634 Disappearance and death of family member: Secondary | ICD-10-CM | POA: Diagnosis not present

## 2021-09-20 NOTE — Telephone Encounter (Signed)
Attempted to reach out to pt to sch home visit,  No answer. I LVM for him to return my call.   Pharmacy reached out to me as well, his bubble packs are ready, but they cant reach him either.   Kerry Hough, EMT-Paramedic  09/20/21

## 2021-09-21 DIAGNOSIS — Z634 Disappearance and death of family member: Secondary | ICD-10-CM | POA: Diagnosis not present

## 2021-09-22 ENCOUNTER — Telehealth (HOSPITAL_COMMUNITY): Payer: Self-pay

## 2021-09-22 DIAGNOSIS — Z634 Disappearance and death of family member: Secondary | ICD-10-CM | POA: Diagnosis not present

## 2021-09-22 NOTE — Telephone Encounter (Signed)
Contacted pt to sch home visit, now his phone is going straight to VM.  I did reach out to his PO and he has a sch visit with him tomor at the house. I have asked him to relay the info that he needs to call me and that the pharmacy is trying to reach him to get his meds to him.    Kerry Hough, EMT-Paramedic  09/22/21

## 2021-09-23 DIAGNOSIS — I5032 Chronic diastolic (congestive) heart failure: Secondary | ICD-10-CM | POA: Diagnosis not present

## 2021-09-23 DIAGNOSIS — J9611 Chronic respiratory failure with hypoxia: Secondary | ICD-10-CM | POA: Diagnosis not present

## 2021-09-23 DIAGNOSIS — Z634 Disappearance and death of family member: Secondary | ICD-10-CM | POA: Diagnosis not present

## 2021-09-24 ENCOUNTER — Telehealth (HOSPITAL_COMMUNITY): Payer: Self-pay | Admitting: Licensed Clinical Social Worker

## 2021-09-24 NOTE — Telephone Encounter (Signed)
Pt returned CSW call from a new number.  States his phone broke and just got new phone today.  Pt reports he hasn't paid rent yet because he doesn't get his check from [employer] until the 10th.  CSW discussed the importance of him making sure he has enough money left in his account to pay for rent- told him he needs to call [facility] immediately to inform him he will be paying them tomorrow (sister is lending him the money per patient) and then discuss with them if he can pay them early so that he can go ahead and give them rent as soon as his check comes in so he doesn't accidentally spend too much.  CSW informed pt he needs to reach out to parole office immediately as he is also trying to locate him.  Admits he has not been able to stay clean but doesn't report the last time he used.  He states he just has bad thoughts that go through his head which make it hard for him to stay on track.  CSW encouraged him to reach back out to his sponsor with [employer] which he reports was helping him.  Also encouraged him to consider coming to see me in clinic as we have discussed before but he kept having other things come up.  Discussed the idea of me helping to get him established with a mental health provider but that I could speak with him regularly until that time- pt will plan to reach out to discuss more once he works on current housing concerns.  Will continue to follow and assist as needed  [name], LCSW Clinical Social Worker [facility] Desk#: [phone] Cell#: [phone]

## 2021-09-24 NOTE — Telephone Encounter (Signed)
CSW informed by [employer] that she has not been able to get a hold of the patient after multiple attempts.  CSW attempted as well and phone goes straight to VM- CSW left VM and sent multiple texts over the past few days.  Parole officer completed home visit and pt was not there- left his card for pt to get back in contact with him at.  CSW received email from pt apartment site stating that his rent is past due- CSW informed parole officer of this so he can help remind pt if he gets a hold of him. Parole officer also supplied CSW with contact for pt sister, [name] [phone], which they had on file from pt release back in 2020.  CSW attempted to call pt sister but number doesn't have VM set up- sent text informing of who I was and requesting call back regarding her brother.  Will continue to follow and assist as needed  [name], LCSW Clinical Social Worker [facility] Desk#: [phone] Cell#: [phone]

## 2021-09-27 ENCOUNTER — Telehealth (HOSPITAL_COMMUNITY): Payer: Self-pay

## 2021-09-27 DIAGNOSIS — Z634 Disappearance and death of family member: Secondary | ICD-10-CM | POA: Diagnosis not present

## 2021-09-27 NOTE — Telephone Encounter (Signed)
Attempted to contact pt regarding home visit. I used the new number that was given to [place]. No answer.  I LVM for him to return my call.    Kerry Hough, EMT-Paramedic  09/27/21

## 2021-09-28 ENCOUNTER — Telehealth (HOSPITAL_COMMUNITY): Payer: Self-pay

## 2021-09-28 DIAGNOSIS — Z634 Disappearance and death of family member: Secondary | ICD-10-CM | POA: Diagnosis not present

## 2021-09-28 NOTE — Telephone Encounter (Signed)
Finally reached pt today. He said he was going to [employer] to get his furniture today and I also reminded him to get his meds from pharmacy.  I will f/u with him next week.    Kerry Hough, EMT-Paramedic  09/28/21

## 2021-10-01 ENCOUNTER — Telehealth (HOSPITAL_COMMUNITY): Payer: Self-pay | Admitting: Licensed Clinical Social Worker

## 2021-10-01 NOTE — Telephone Encounter (Signed)
CSW spoke with pt yesterday- he confirms he paid his rent and plans to pay his rent early every month now.  Reports he will stay at home to avoid trouble and that he has drug tests scheduled in the future.  Needed help finding his [employer] acct ID so he can pay that bill- CSW provided to him  Pt reports his "bad thoughts" haven't been prevalent lately and denies need to meet with CSW at this time  Will continue to follow and assist as needed  [name], LCSW Clinical Social Worker [facility] Desk#: [phone] Cell#: [phone]

## 2021-10-02 DIAGNOSIS — J9611 Chronic respiratory failure with hypoxia: Secondary | ICD-10-CM | POA: Diagnosis not present

## 2021-10-02 DIAGNOSIS — I5032 Chronic diastolic (congestive) heart failure: Secondary | ICD-10-CM | POA: Diagnosis not present

## 2021-10-04 ENCOUNTER — Telehealth (HOSPITAL_COMMUNITY): Payer: Self-pay

## 2021-10-04 DIAGNOSIS — Z634 Disappearance and death of family member: Secondary | ICD-10-CM | POA: Diagnosis not present

## 2021-10-04 NOTE — Telephone Encounter (Signed)
Attempted to reach pt on both phone lines, one goes straight to VM and the old number rings, no answer and the mailbox is full.   Kerry Hough, EMT-Paramedic  10/04/21

## 2021-10-05 ENCOUNTER — Telehealth (HOSPITAL_COMMUNITY): Payer: Self-pay

## 2021-10-05 NOTE — Telephone Encounter (Signed)
Attempted to contact pt on both numbers provided in chart, goes straight to VM.  I confirmed with pharmacy that his meds got delivered.    Kerry Hough, EMT-Paramedic  10/05/21

## 2021-10-06 ENCOUNTER — Telehealth (HOSPITAL_COMMUNITY): Payer: Self-pay | Admitting: Licensed Clinical Social Worker

## 2021-10-06 DIAGNOSIS — Z634 Disappearance and death of family member: Secondary | ICD-10-CM | POA: Diagnosis not present

## 2021-10-06 NOTE — Telephone Encounter (Signed)
Pt being DC'd from paramedicine program at this time due to lack of communication.  Pt was already being scheduled for DC as he has everything he needs and is on bubble packs so was not requiring much involvement at this time.  Attempted to call both pt phone numbers but went straight to VM- texted to check in- no response  Will continue to follow through clinic and assist as needed  [name], LCSW Clinical Social Worker [facility] Desk#: [phone] Cell#: [phone]

## 2021-10-07 DIAGNOSIS — Z634 Disappearance and death of family member: Secondary | ICD-10-CM | POA: Diagnosis not present

## 2021-10-11 DIAGNOSIS — Z634 Disappearance and death of family member: Secondary | ICD-10-CM | POA: Diagnosis not present

## 2021-10-13 DIAGNOSIS — J9611 Chronic respiratory failure with hypoxia: Secondary | ICD-10-CM | POA: Diagnosis not present

## 2021-10-13 DIAGNOSIS — E882 Lipomatosis, not elsewhere classified: Secondary | ICD-10-CM | POA: Diagnosis not present

## 2021-10-14 DIAGNOSIS — Z634 Disappearance and death of family member: Secondary | ICD-10-CM | POA: Diagnosis not present

## 2021-10-15 DIAGNOSIS — Z634 Disappearance and death of family member: Secondary | ICD-10-CM | POA: Diagnosis not present

## 2021-10-18 ENCOUNTER — Telehealth (HOSPITAL_COMMUNITY): Payer: Self-pay | Admitting: Licensed Clinical Social Worker

## 2021-10-18 NOTE — Telephone Encounter (Signed)
CSW attempted to contact pt to check in- unable to reach- went straight to VM  Was able to speak with pt sister and request she get him a message- she states that she spoke with him yesterday- she will inform him we are trying to reach him and provide my phone number  Will continue to attempt contact  [name], [facility] Desk#: [phone] Cell#: [phone]

## 2021-10-18 NOTE — Telephone Encounter (Signed)
CSW received call from pt informing he is working on getting a new phone- should arrive within the week.  Pt reports he is up to date on his rent/utilities at this time.  Reports his legs are very swollen- hasn't been to clinic since last may due to lost follow up- CSW spoke with triage staff and appt made for tomorrow to evalute pt- pt aware and reports no barriers to attending.  Will continue to follow and assist as needed  [name], LCSW Clinical Social Worker [facility] Desk#: [phone] Cell#: [phone]

## 2021-10-19 ENCOUNTER — Encounter (HOSPITAL_COMMUNITY): Payer: Self-pay

## 2021-10-19 ENCOUNTER — Ambulatory Visit (HOSPITAL_COMMUNITY)
Admission: RE | Admit: 2021-10-19 | Discharge: 2021-10-19 | Disposition: A | Discharge status: Home / Self Care | Payer: Medicaid Other | Source: Ambulatory Visit | Attending: Cardiology | Admitting: Cardiology

## 2021-10-19 ENCOUNTER — Telehealth (HOSPITAL_COMMUNITY): Payer: Self-pay | Admitting: Cardiology

## 2021-10-19 ENCOUNTER — Other Ambulatory Visit: Payer: Self-pay

## 2021-10-19 VITALS — BP 160/90 | HR 94 | Wt 368.2 lb

## 2021-10-19 DIAGNOSIS — I509 Heart failure, unspecified: Secondary | ICD-10-CM | POA: Diagnosis not present

## 2021-10-19 DIAGNOSIS — Z79899 Other long term (current) drug therapy: Secondary | ICD-10-CM | POA: Insufficient documentation

## 2021-10-19 DIAGNOSIS — K219 Gastro-esophageal reflux disease without esophagitis: Secondary | ICD-10-CM | POA: Diagnosis not present

## 2021-10-19 DIAGNOSIS — Z7951 Long term (current) use of inhaled steroids: Secondary | ICD-10-CM | POA: Diagnosis not present

## 2021-10-19 DIAGNOSIS — F1910 Other psychoactive substance abuse, uncomplicated: Secondary | ICD-10-CM

## 2021-10-19 DIAGNOSIS — Z7901 Long term (current) use of anticoagulants: Secondary | ICD-10-CM | POA: Diagnosis not present

## 2021-10-19 DIAGNOSIS — G4733 Obstructive sleep apnea (adult) (pediatric): Secondary | ICD-10-CM | POA: Diagnosis not present

## 2021-10-19 DIAGNOSIS — F1290 Cannabis use, unspecified, uncomplicated: Secondary | ICD-10-CM | POA: Diagnosis not present

## 2021-10-19 DIAGNOSIS — J441 Chronic obstructive pulmonary disease with (acute) exacerbation: Secondary | ICD-10-CM | POA: Diagnosis not present

## 2021-10-19 DIAGNOSIS — Z7984 Long term (current) use of oral hypoglycemic drugs: Secondary | ICD-10-CM | POA: Insufficient documentation

## 2021-10-19 DIAGNOSIS — D649 Anemia, unspecified: Secondary | ICD-10-CM | POA: Diagnosis not present

## 2021-10-19 DIAGNOSIS — E119 Type 2 diabetes mellitus without complications: Secondary | ICD-10-CM | POA: Diagnosis not present

## 2021-10-19 DIAGNOSIS — Z6841 Body Mass Index (BMI) 40.0 and over, adult: Secondary | ICD-10-CM | POA: Insufficient documentation

## 2021-10-19 DIAGNOSIS — R4182 Altered mental status, unspecified: Secondary | ICD-10-CM | POA: Diagnosis not present

## 2021-10-19 DIAGNOSIS — F1091 Alcohol use, unspecified, in remission: Secondary | ICD-10-CM | POA: Insufficient documentation

## 2021-10-19 DIAGNOSIS — Z833 Family history of diabetes mellitus: Secondary | ICD-10-CM | POA: Insufficient documentation

## 2021-10-19 DIAGNOSIS — I480 Paroxysmal atrial fibrillation: Secondary | ICD-10-CM | POA: Diagnosis not present

## 2021-10-19 DIAGNOSIS — M2550 Pain in unspecified joint: Secondary | ICD-10-CM | POA: Insufficient documentation

## 2021-10-19 DIAGNOSIS — E118 Type 2 diabetes mellitus with unspecified complications: Secondary | ICD-10-CM

## 2021-10-19 DIAGNOSIS — I10 Essential (primary) hypertension: Secondary | ICD-10-CM

## 2021-10-19 DIAGNOSIS — Z9981 Dependence on supplemental oxygen: Secondary | ICD-10-CM | POA: Insufficient documentation

## 2021-10-19 DIAGNOSIS — M1990 Unspecified osteoarthritis, unspecified site: Secondary | ICD-10-CM | POA: Diagnosis not present

## 2021-10-19 DIAGNOSIS — I110 Hypertensive heart disease with heart failure: Secondary | ICD-10-CM | POA: Diagnosis not present

## 2021-10-19 DIAGNOSIS — Z86711 Personal history of pulmonary embolism: Secondary | ICD-10-CM | POA: Insufficient documentation

## 2021-10-19 DIAGNOSIS — E662 Morbid (severe) obesity with alveolar hypoventilation: Secondary | ICD-10-CM | POA: Insufficient documentation

## 2021-10-19 DIAGNOSIS — F1410 Cocaine abuse, uncomplicated: Secondary | ICD-10-CM | POA: Diagnosis not present

## 2021-10-19 DIAGNOSIS — Z8719 Personal history of other diseases of the digestive system: Secondary | ICD-10-CM | POA: Insufficient documentation

## 2021-10-19 DIAGNOSIS — I2699 Other pulmonary embolism without acute cor pulmonale: Secondary | ICD-10-CM

## 2021-10-19 DIAGNOSIS — Z139 Encounter for screening, unspecified: Secondary | ICD-10-CM

## 2021-10-19 DIAGNOSIS — Z8249 Family history of ischemic heart disease and other diseases of the circulatory system: Secondary | ICD-10-CM | POA: Insufficient documentation

## 2021-10-19 DIAGNOSIS — I5032 Chronic diastolic (congestive) heart failure: Secondary | ICD-10-CM

## 2021-10-19 DIAGNOSIS — Z8616 Personal history of COVID-19: Secondary | ICD-10-CM | POA: Diagnosis not present

## 2021-10-19 DIAGNOSIS — Z634 Disappearance and death of family member: Secondary | ICD-10-CM | POA: Diagnosis not present

## 2021-10-19 LAB — CBC
HCT: 44.8 % (ref 39.0–52.0)
Hemoglobin: 13.9 g/dL (ref 13.0–17.0)
MCH: 25 pg — ABNORMAL LOW (ref 26.0–34.0)
MCHC: 31 g/dL (ref 30.0–36.0)
MCV: 80.6 fL (ref 80.0–100.0)
Platelets: 368 10*3/uL (ref 150–400)
RBC: 5.56 MIL/uL (ref 4.22–5.81)
RDW: 14.9 % (ref 11.5–15.5)
WBC: 7.1 10*3/uL (ref 4.0–10.5)
nRBC: 0 % (ref 0.0–0.2)

## 2021-10-19 LAB — BASIC METABOLIC PANEL
Anion gap: 8 (ref 5–15)
BUN: 16 mg/dL (ref 6–20)
CO2: 29 mmol/L (ref 22–32)
Calcium: 9 mg/dL (ref 8.9–10.3)
Chloride: 100 mmol/L (ref 98–111)
Creatinine, Ser: 1.34 mg/dL — ABNORMAL HIGH (ref 0.61–1.24)
GFR, Estimated: >60 mL/min (ref >60)
Glucose, Bld: 105 mg/dL — ABNORMAL HIGH (ref 70–99)
Potassium: 3.9 mmol/L (ref 3.5–5.1)
Sodium: 137 mmol/L (ref 135–145)

## 2021-10-19 LAB — BRAIN NATRIURETIC PEPTIDE: B Natriuretic Peptide: 11.3 pg/mL (ref 0.0–100.0)

## 2021-10-19 MED ORDER — TORSEMIDE 20 MG PO TABS: 80.0000 mg | ORAL_TABLET | Freq: Two times a day (BID) | ORAL | 3 refills | Status: DC

## 2021-10-19 MED ORDER — TORSEMIDE 20 MG PO TABS: ORAL_TABLET | ORAL | 3 refills | Status: DC

## 2021-10-19 NOTE — Patient Instructions (Signed)
INCREASE Torsemide to 80 mg ( 4 tabs) twice a day  Labs today We will only contact you if something comes back abnormal or we need to make some changes. Otherwise no news is good news!  Your physician recommends that you schedule a follow-up appointment in: 2 weeks  in the Advanced Practitioners (PA/NP) Clinic    Do the following things EVERYDAY: Weigh yourself in the morning before breakfast. Write it down and keep it in a log. Take your medicines as prescribed Eat low salt foods--Limit salt (sodium) to 2000 mg per day.  Stay as active as you can everyday Limit all fluids for the day to less than 2 liters  At the [facility], you and your health needs are our priority. As part of our continuing mission to provide you with exceptional heart care, we have created designated Provider Care Teams. These Care Teams include your primary Cardiologist (physician) and Advanced Practice Providers (APPs- Physician Assistants and Nurse Practitioners) who all work together to provide you with the care you need, when you need it.   You may see any of the following providers on your designated Care Team at your next follow up: Dr Arvilla Meres Dr Carron Curie, NP Robbie Lis, [place] [facility] [place], [place] Karle Plumber, PharmD   Please be sure to bring in all your medications bottles to every appointment.   If you have any questions or concerns before your next appointment please send [place] a message through [place] or call our office at [phone].    TO LEAVE A MESSAGE FOR THE NURSE SELECT OPTION 2, PLEASE LEAVE A MESSAGE INCLUDING: YOUR NAME DATE OF BIRTH CALL BACK NUMBER REASON FOR CALL**this is important as we prioritize the call backs  YOU WILL RECEIVE A CALL BACK THE SAME DAY AS LONG AS YOU CALL BEFORE 4:00 PM

## 2021-10-19 NOTE — Telephone Encounter (Signed)
-----   Message from Rafael Bihari, [place] sent at 10/19/2021  2:17 PM EST ----- Labs stable. Please only increase torsemide to 80 bid x 5 days, then back to 40 bid.

## 2021-10-19 NOTE — Telephone Encounter (Signed)
Patient called.  Left message for patient to call back, will resend order to pharmacy and message Katie to assist with med change

## 2021-10-19 NOTE — Progress Notes (Signed)
Advanced Heart Failure Clinic Note   PCP: [name], NP PCP-Cardiologist: Dorris Carnes, MD  [facility]: Dr. Aundra Dubin   HPI: Mr. Brumett is a 56 yo with hx of diastolic CHF, HTN, DM, Atrial fib, PE in April 2020 (on Xarelto), OSA, GERD, chronic elevation of troponin.    Admitted 01/21/2019 with increased dyspnea and had PE. He was anticoagulated.    Patient re-admitted 5/20 for syncope/cough/dyspnea, COVID+, CT positive for bilateral GGOs, admitted to [facility] and discharged after 6 days, on O2.  Rec'd Cards f/u.   Patient re-admitted 02/20/19 for bright red blood per rectum, eval'ed by GI, had EGD/colon, dx'd with hemorrhoidal bleeding, discharged after 9 days on O2. He was again Covid + .   Readmitted 03/11/19 with increased shortness of breath. He did not have home oxygen. He was discharged to home the next day.    He presented to [facility] ED 04/11/19 with lower extremity edema. COVID negative. Diuresed with lasix drip and transitioned to torsemide 40 mg twice daily. Had [place] with mild volume overload and preserved cardiac output. Discharge weight was 407.9 pounds.    He was readmitted with symptomatic anemia 05/28/19 with hgb 7.1. Received 1UPRBCs. GI consulted. He started on anusol and continued on stool softner.    He was readmitted to the hospital on 07/16/19 when he presented to the ED w/ worsening symptoms and was admitted by IM for a/c diastolic CHF and treated w/ IV Lasix. Had 2 day hospital stay and was discharged on 10/29. Echo was done 10/28 and showed normal LVEF 65-70%, RV interpreted as normal but suspect significant RV dysfunction.  After diuresis w/ IV Lasix, he was placed back on home diuretic regimen, torsemide 100 mg bid. Lisinopril 5 mg was also added to regimen. AHF team was not consulted that admit.   He was seen back in clinic 07/30/19. Felt poorly. SOB at rest and worse w/ exertion. Also w/ orthopnea/PND. Significant wt gain of 20 lb up to 428 lb (dry wt ~407 lb), w/ marked abdominal  distention and LEE and poor response to IV diuretics.  Once diuresed transitioned to torsemide 100 mg twice a day. D/C 408 pounds.   Zio patch on 10/20/19-No arrhythmia.   Re-admitted 08/19/20-08/24/20 for  A fib RVR. A. fib RVR in the setting of noncompliance ethanol and cocaine. Cardizem started and labetalol continued.  12/3 TEE (EF 60-65%) with DCCV successfully converted to SR. Diuresed with IV lasix and had AKI in setting of over diuresis. SCr 1.59 on admit, bumped to 2.5. Diuretics held + IVF hydration.   Admitted 12/24/20 with hypertensive emergency and respiratory failure. .   Admitted 422/22 with A Fib RVR & AMS. Diltiazem increased to 360 mg daily. Chemically converted to NSR.  + Cocaine.   Seen in ED 11/22 for COPD exacerbation.  DC'd from paramedicine due to lack of communication.  Today he returns for HF follow up. Last seen in clinic 01/2021. Remains on home 3L O2. Using Trelegy. Last used cocaine 5 days ago. Feels legs are swelling. Limited by pain.  He is SOB walking on flat ground.  Denies CP, abnormal bleeding, palpitations, dizziness, or PND/Orthopnea. Appetite ok. No fever or chills. Weight at home 368 pounds. Taking all medications provided in bubble packs by [name]. Phone is broken, awaiting new one.   ECG (personally reviewed): NSR 83 bpm  Labs (11/20): BNP 32, K 3.6, creatinine 1.09  Labs (12/20): K 3.3, creatinine 1.28 Labs (10/10/19): K 3.9 Creatinine 1.4  11/05/19) : K 3.2 Creatinine 1.47  °Labs (12/30/2019) : K 3.5 Creatinine 1.14  °Labs (03/02/2020): K 3.8 Creatinine 1.09.  °Labs (12/21): K 4.0, Creatinine 1.17, Magnesium 1.6 °Labs (09/15/20): K 3.1, Creatinine 2.54 °Labs (1/22): K 3.1, creatinine 1.56 °Labs (5/22): K 3.6, creatinine 1.89 ° °Review of Systems: All systems reviewed and negative except as per HPI.  ° °PMH: °1. Atrial fibrillation: Paroxysmal.  °- DCCV (08/21/20): converted to SR °2. Pulmonary embolus: 5/20.  °3. OHS/OSA: He is on home oxygen  during the day and uses CPAP at night.  °4. Morbid obesity.  °5. Chronic diastolic CHF:  °- RHC (7/20): mean RA 12, PA 40/25 mean 31, mean PCWP 23, CI 2.47, PVR 1.1 WU.  °- Echo (10/20): EF 65-70%, mild LVH, normal RV size and systolic function.  °- TEE (12/21): EF 60-65% °6. Type 2 diabetes °7. HTN °8. Rectal bleeding: ?Hemorrhoidal.  °9. COVID-19 infection 7/20.  °10. ZIo Patch - 10/20/19 no arrhythmias ° °NST 08/23/20 °There was no ST segment deviation noted during stress. °The left ventricular ejection fraction is moderately decreased (30-44%). °Nuclear stress EF: 43%. °This is a low risk study. °The study is normal. °  °Fixed apical inferior perfusion defect with normal wall motion in this area, consistent with artifact ° °Current Outpatient Medications  °Medication Sig Dispense Refill  ° hydrALAZINE (APRESOLINE) 50 MG tablet TAKE ONE TABLET BY MOUTH THREE TIMES A DAY. (NOON+PM+BEDTIME) 180 tablet 3  ° atorvastatin (LIPITOR) 40 MG tablet TAKE 1 TABLET (40 MG TOTAL) BY MOUTH AT BEDTIME. 90 tablet 0  ° Blood Glucose Monitoring Suppl (TRUE METRIX METER) w/Device KIT Use as instructed. Check blood glucose level by fingerstick twice per day. 1 kit 0  ° Blood Pressure Monitor DEVI Please provide patient with insurance approved blood pressure device with L-XL cuff. BMI 55 (Patient taking differently: 1 each by Other route See admin instructions. Please provide patient with insurance approved blood pressure device with L-XL cuff. BMI 55) 1 each 0  ° carboxymethylcellulose (REFRESH PLUS) 0.5 % SOLN Place 1 drop into both eyes daily.    ° diltiazem (CARDIZEM CD) 360 MG 24 hr capsule Take 1 capsule (360 mg total) by mouth daily. 90 capsule 3  ° doxazosin (CARDURA) 2 MG tablet TAKE 1 TABLET (2 MG TOTAL) BY MOUTH DAILY(NOON) 30 tablet 3  ° doxycycline (VIBRAMYCIN) 100 MG capsule Take 1 capsule (100 mg total) by mouth 2 (two) times daily. 20 capsule 0  ° ferrous sulfate (FEROSUL) 325 (65 FE) MG tablet TAKE 1 TABLET (325 MG  TOTAL) BY MOUTH DAILY (NOON) 30 tablet 0  ° glucose blood (TRUE METRIX BLOOD GLUCOSE TEST) test strip Use as instructed. Check blood glucose level by fingerstick twice per day. 100 each 12  ° losartan (COZAAR) 50 MG tablet Take 1 tablet (50 mg total) by mouth daily. 90 tablet 3  ° Lumateperone Tosylate (CAPLYTA) 42 MG CAPS Take 42 mg by mouth at bedtime. 30 capsule   ° magnesium oxide (MAG-OX) 400 (240 Mg) MG tablet TAKE 2 TABLETS BY MOUTH EVERY MORNING AND 1 TABLET EVERY EVENING ( NOON+PM+BEDTIME) 90 tablet 2  ° metFORMIN (GLUCOPHAGE) 500 MG tablet TAKE ONE TABLET BY MOUTH TWICE DAILY WITH A MEAL (NOON+BEDTIME) 180 tablet 0  ° Misc. Devices MISC Requires O2 @ 3L/min continuously via nasal canula and home fill system,. (Patient taking differently: 1 each by Other route See admin instructions. Requires O2 @ 3L/min continuously via nasal canula and home fill system,.) 1 each 0  °   0   Misc. Devices MISC Please provide Mr. Koelling with insurance approved portable O2 concentrator ICD 10 J96.11 [phone] (Patient taking differently: 1 each by Other route See admin instructions. Please provide Mr. Galen with insurance approved portable O2 concentrator ICD 10 J96.11 Z99.81) 1 each 0   Misc. Devices MISC Please provide patient with insurance approved LARGE right hand splint ICD 10 G56.01 1 each 0   Misc. Devices MISC Please provide patient wit insurance approved diabetic custom molded shoes (QTY 2) and custom molded multi density inserts (1 for each shoe). 1 each 0   omeprazole (PRILOSEC) 20 MG capsule TAKE 1 CAPSULE 2 (TWO) TIMES DAILY BEFORE A MEAL. TAKE FIRST CAPSULE 30 MIN PRIOR TO EATING OR TAKING OTHER MEDICATIONS. (AM+BEDTIME) 180 capsule 1   potassium chloride (KLOR-CON) 10 MEQ tablet TAKE 8 TABLETS (80 MEQ TOTAL) BY MOUTH DAILY. 240 tablet 3   PROVENTIL HFA 108 (90 Base) MCG/ACT inhaler INHALE 2 PUFFS BY MOUTH EVERY 6 (SIX) HOURS AS NEEDED FOR WHEEZING OR SHORTNESS OF BREATH. (Patient taking differently: Inhale 2  puffs into the lungs every 6 (six) hours as needed for wheezing or shortness of breath.) 18 g 6   rivaroxaban (XARELTO) 20 MG TABS tablet TAKE 1 TABLET BY MOUTH DAILY WITH SUPPER (BEDTIME) 90 tablet 1   spironolactone (ALDACTONE) 50 MG tablet TAKE 1 TABLET (50 MG TOTAL) BY MOUTH DAILY (NOON) 30 tablet 11   STOOL SOFTENER 100 MG capsule TAKE 1 CAPSULE BY MOUTH 2 (TWO) TIMES DAILY (NOON+BEDTIME) 60 capsule 0   torsemide (DEMADEX) 20 MG tablet TAKE 3 TABLETS (60 MG TOTAL) BY MOUTH 2 (TWO) TIMES DAILY (NOON+BEDTIME) 180 tablet 3   TRUEplus Lancets 28G MISC Use as instructed. Check blood glucose level by fingerstick twice per day. 200 each 3   No current facility-administered medications for this encounter.   No Known Allergies  Social History   Socioeconomic History   Marital status: Single    Spouse name: Not on file   Number of children: Not on file   Years of education: Not on file   Highest education level: Not on file  Occupational History   Not on file  Tobacco Use   Smoking status: Former   Smokeless tobacco: Never   Tobacco comments:    smoked weed in the past  Vaping Use   Vaping Use: Never used  Substance and Sexual Activity   Alcohol use: Not Currently   Drug use: Yes    Types: Marijuana, Cocaine   Sexual activity: Not Currently  Other Topics Concern   Not on file  Social History Narrative   Not on file   Social Determinants of Health   Financial Resource Strain: High Risk   Difficulty of Paying Living Expenses: Hard  Food Insecurity: No Food Insecurity   Worried About Running Out of Food in the Last Year: Never true   Ran Out of Food in the Last Year: Never true  Transportation Needs: No Transportation Needs   Lack of Transportation (Medical): No   Lack of Transportation (Non-Medical): No  Physical Activity: Not on file  Stress: Not on file  Social Connections: Not on file  Intimate Partner Violence: Not on file   Family History  Problem Relation Age of  Onset   Hypertension Mother    Diabetes Mother    BP (!) 160/90    Pulse 94    Wt (!) 167 kg (368 lb 3.2 oz)    SpO2 96%  Wt Readings from Last 3 Encounters:  °10/19/21 (!) 167 kg (368 lb 3.2 oz)  °01/21/21 (!) 161.3 kg (355 lb 9.6 oz)  °01/10/21 (!) 163.8 kg (361 lb 3.2 oz)  ° °PHYSICAL EXAM: °General:  NAD. No resp difficulty, falling asleep during exam. °HEENT: Normal °Neck: Supple. thick neck. Carotids 2+ bilat; no bruits. No lymphadenopathy or thryomegaly appreciated. °Cor: PMI nondisplaced. Regular rate & rhythm. No rubs, gallops or murmurs. °Lungs: Clear °Abdomen: Obese nontender, nondistended. No hepatosplenomegaly. No bruits or masses. Good bowel sounds. °Extremities: No cyanosis, clubbing, rash, 2+ BLE edema, + varicosities to BLE °Neuro: Alert & oriented x 3, cranial nerves grossly intact. Moves all 4 extremities w/o difficulty. Affect pleasant. ° °ASSESSMENT & PLAN: °1. Chronic diastolic CHF: Echo in 10/20 with EF 65-70%, RV interpreted as normal but suspect significant RV dysfunction.  °- NYHA III- IIIb, functional status difficult due to generalized arthritic pain. He is volume overloaded on exam, weight up 13 lbs. °- Discussed IV lasix in clinic, he would like to try orals. °- Increase torsemide to 80 mg bid + 80 KCL daily. °- Continue spironolactone 50 mg daily.  °- Continue hydralazine 50 mg tid. °- Continue losartan 50 mg daily. °- Continue to weigh daily and watch salt/fluid intake. °- Repeat echo has been ordered. °- BMET and BNP today. ° °2. Atrial fibrillation: Paroxysmal.  In NSR today.  °- Continue current dose of diltiazem.  °- Continue Xarelto. No bleeding issues. °- CBC today. ° °3. H/o PE: 5/20, continue Xarelto.  ° °4. OHS/OSA: Now on Trelegy. °- Continue home O2. ° °5. HTN: Elevated today. Diurese as above. °- Continue current regimen.  ° °6. DM °- Per PCP. ° °7. Obesity °- Body mass index is 51.35 kg/m².  ° °8. Substance abuse: Last used cocaine 5 days ago.  Discussed cessation from cocaine. He knows he needs to quit. HFSW available should he decide on treatment. ° °9. SDOH °- He has a car and a place to live. °- No phone, but will have one soon. GF's # updated in Epic, we can call her to get in touch with patient until new phone arrives. °- He has a scale and a PCP. °- Meds thru [name] in bubble packs. °- HFSW helping. ° °Follow up in 2 weeks with APP (check BMET). ° ° M , FNP °10/19/2021 °11:49 AM ° °

## 2021-10-20 ENCOUNTER — Telehealth (HOSPITAL_COMMUNITY): Payer: Self-pay

## 2021-10-20 DIAGNOSIS — Z634 Disappearance and death of family member: Secondary | ICD-10-CM | POA: Diagnosis not present

## 2021-10-20 NOTE — Telephone Encounter (Signed)
Pt was previously on paramedicine but d/c due to him being on bubble packs, lack of communication with myself as well. I did get forwarded message about med changes for the next 5 days for his torsemide-I seen where his phone is broken and awaiting for new one.  I asked pharmacy to send him the extra pills in bottle so he can take them since he is on bubble packs-I texted his gf number to let him know pharmacy will be sending out bottle of the tablets so he can do the increased dose.    Kerry Hough, EMT-Paramedic  10/20/21

## 2021-10-21 ENCOUNTER — Telehealth (HOSPITAL_COMMUNITY): Payer: Self-pay

## 2021-10-21 DIAGNOSIS — Z634 Disappearance and death of family member: Secondary | ICD-10-CM | POA: Diagnosis not present

## 2021-10-21 NOTE — Telephone Encounter (Signed)
Tried calling patient, no answer,unable to leave message, vm full. Letter mailed to address on file °

## 2021-10-21 NOTE — Telephone Encounter (Signed)
-----   Message from Jessica M Milford, FNP sent at 10/19/2021  2:17 PM EST ----- °Labs stable. Please only increase torsemide to 80 bid x 5 days, then back to 40 bid.  °

## 2021-10-24 DIAGNOSIS — J9611 Chronic respiratory failure with hypoxia: Secondary | ICD-10-CM | POA: Diagnosis not present

## 2021-10-24 DIAGNOSIS — I5032 Chronic diastolic (congestive) heart failure: Secondary | ICD-10-CM | POA: Diagnosis not present

## 2021-10-27 ENCOUNTER — Telehealth (HOSPITAL_COMMUNITY): Payer: Self-pay | Admitting: Licensed Clinical Social Worker

## 2021-10-27 DIAGNOSIS — Z634 Disappearance and death of family member: Secondary | ICD-10-CM | POA: Diagnosis not present

## 2021-10-27 NOTE — Telephone Encounter (Signed)
CSW received VM provided CSW with new contact number.  Number updated in chart.  Attempted to call pt to check in- unable to reach- left VM requesting return call  [name], LCSW Clinical Social Worker Advanced [facility] Failure Clinic Desk#: [phone] Cell#: [phone]

## 2021-10-28 ENCOUNTER — Ambulatory Visit (HOSPITAL_COMMUNITY)
Admission: EM | Admit: 2021-10-28 | Discharge: 2021-10-28 | Disposition: A | Discharge status: Home / Self Care | Payer: Medicaid Other

## 2021-10-28 DIAGNOSIS — Z634 Disappearance and death of family member: Secondary | ICD-10-CM | POA: Diagnosis not present

## 2021-10-28 NOTE — ED Notes (Signed)
Patient is being discharged from the Urgent Care and sent to the Emergency Department via POV with family . Per Dr Marlinda Mike, patient is in need of higher level of care due to Shortness of breath, fluid retention and CHF patient. Patient is aware and verbalizes understanding of plan of care.  Vitals:   10/28/21 1308  BP: (!) 164/103  Pulse: 96  Resp: (!) 26  SpO2: 94%

## 2021-10-28 NOTE — ED Triage Notes (Signed)
Pt reports that has CHF and reports legs and abd swelling with SOB. Reports increased his fluid pills couple days ago but still not better.  Dr Marlinda Mike made aware and in triage assessing patient.

## 2021-10-28 NOTE — ED Provider Notes (Signed)
Pt here with increased dyspnea. Has h/o CHF, and med adjustments made recently , but dyspnea has been increasing.  Is usually on home O2, but forgot it at home. Sat here on RA overall good.  Talking with him, he is able to say 4-5 words in a row, still working pretty hard to breathe.   I have asked him to proceed to the ER, as he may need advanced imaging and urgent lab   Zenia Resides, MD 10/28/21 1314

## 2021-11-01 DIAGNOSIS — Z634 Disappearance and death of family member: Secondary | ICD-10-CM | POA: Diagnosis not present

## 2021-11-01 NOTE — Progress Notes (Incomplete)
Advanced Heart Failure Clinic Note   PCP: [name], NP PCP-Cardiologist: Dorris Carnes, MD  [facility]: Dr. Aundra Dubin   HPI: Benjamin [name] is a 56 yo with hx of diastolic CHF, HTN, DM, Atrial fib, PE in April 2020 (on Xarelto), OSA, GERD, chronic elevation of troponin.    Admitted 01/21/2019 with increased dyspnea and had PE. He was anticoagulated.    Patient re-admitted 5/20 for syncope/cough/dyspnea, COVID+, CT positive for bilateral GGOs, admitted to [facility] and discharged after 6 days, on O2.  Rec'd Cards f/u.   Patient re-admitted 02/20/19 for bright red blood per rectum, eval'ed by GI, had EGD/colon, dx'd with hemorrhoidal bleeding, discharged after 9 days on O2. He was again Covid + .   Readmitted 03/11/19 with increased shortness of breath. He did not have home oxygen. He was discharged to home the next day.    He presented to [facility] ED 04/11/19 with lower extremity edema. COVID negative. Diuresed with lasix drip and transitioned to torsemide 40 mg twice daily. Had [place] with mild volume overload and preserved cardiac output. Discharge weight was 407.9 pounds.    He was readmitted with symptomatic anemia 05/28/19 with hgb 7.1. Received 1UPRBCs. GI consulted. He started on anusol and continued on stool softner.    He was readmitted to the hospital on 07/16/19 when he presented to the ED w/ worsening symptoms and was admitted by IM for a/c diastolic CHF and treated w/ IV Lasix. Had 2 day hospital stay and was discharged on 10/29. Echo was done 10/28 and showed normal LVEF 65-70%, RV interpreted as normal but suspect significant RV dysfunction.  After diuresis w/ IV Lasix, he was placed back on home diuretic regimen, torsemide 100 mg bid. Lisinopril 5 mg was also added to regimen. AHF team was not consulted that admit.   He was seen back in clinic 07/30/19. Felt poorly. SOB at rest and worse w/ exertion. Also w/ orthopnea/PND. Significant wt gain of 20 lb up to 428 lb (dry wt ~407 lb), w/ marked abdominal  distention and LEE and poor response to IV diuretics.  Once diuresed transitioned to torsemide 100 mg twice a day. D/C 408 pounds.   Zio patch on 10/20/19-No arrhythmia.   Re-admitted 08/19/20-08/24/20 for  A fib RVR. A. fib RVR in the setting of noncompliance ethanol and cocaine. Cardizem started and labetalol continued.  12/3 TEE (EF 60-65%) with DCCV successfully converted to SR. Diuresed with IV lasix and had AKI in setting of over diuresis. SCr 1.59 on admit, bumped to 2.5. Diuretics held + IVF hydration.   Admitted 12/24/20 with hypertensive emergency and respiratory failure. .   Admitted 422/22 with A Fib RVR & AMS. Diltiazem increased to 360 mg daily. Chemically converted to NSR.  + Cocaine.   Seen in ED 11/22 for COPD exacerbation.  DC'd from paramedicine due to lack of communication.  Today he returns for HF follow up. Last seen in clinic 01/2021. Remains on home 3L O2. Using Trelegy. Last used cocaine 5 days ago. Feels legs are swelling. Limited by pain.  He is SOB walking on flat ground.  Denies CP, abnormal bleeding, palpitations, dizziness, or PND/Orthopnea. Appetite ok. No fever or chills. Weight at home 368 pounds. Taking all medications provided in bubble packs by [name]. Phone is broken, awaiting new one.   ECG (personally reviewed): NSR 83 bpm  Labs (11/20): BNP 32, K 3.6, creatinine 1.09  Labs (12/20): K 3.3, creatinine 1.28 Labs (10/10/19): K 3.9 Creatinine 1.4  11/05/19) : K 3.2 Creatinine 1.47  °Labs (12/30/2019) : K 3.5 Creatinine 1.14  °Labs (03/02/2020): K 3.8 Creatinine 1.09.  °Labs (12/21): K 4.0, Creatinine 1.17, Magnesium 1.6 °Labs (09/15/20): K 3.1, Creatinine 2.54 °Labs (1/22): K 3.1, creatinine 1.56 °Labs (5/22): K 3.6, creatinine 1.89 ° °Review of Systems: All systems reviewed and negative except as per HPI.  ° °PMH: °1. Atrial fibrillation: Paroxysmal.  °- DCCV (08/21/20): converted to SR °2. Pulmonary embolus: 5/20.  °3. OHS/OSA: He is on home oxygen  during the day and uses CPAP at night.  °4. Morbid obesity.  °5. Chronic diastolic CHF:  °- RHC (7/20): mean RA 12, PA 40/25 mean 31, mean PCWP 23, CI 2.47, PVR 1.1 WU.  °- Echo (10/20): EF 65-70%, mild LVH, normal RV size and systolic function.  °- TEE (12/21): EF 60-65% °6. Type 2 diabetes °7. HTN °8. Rectal bleeding: ?Hemorrhoidal.  °9. COVID-19 infection 7/20.  °10. ZIo Patch - 10/20/19 no arrhythmias ° °NST 08/23/20 °There was no ST segment deviation noted during stress. °The left ventricular ejection fraction is moderately decreased (30-44%). °Nuclear stress EF: 43%. °This is a low risk study. °The study is normal. °  °Fixed apical inferior perfusion defect with normal wall motion in this area, consistent with artifact ° °Current Outpatient Medications  °Medication Sig Dispense Refill  ° hydrALAZINE (APRESOLINE) 50 MG tablet TAKE ONE TABLET BY MOUTH THREE TIMES A DAY. (NOON+PM+BEDTIME) 180 tablet 3  ° atorvastatin (LIPITOR) 40 MG tablet TAKE 1 TABLET (40 MG TOTAL) BY MOUTH AT BEDTIME. 90 tablet 0  ° Blood Glucose Monitoring Suppl (TRUE METRIX METER) w/Device KIT Use as instructed. Check blood glucose level by fingerstick twice per day. 1 kit 0  ° Blood Pressure Monitor DEVI Please provide patient with insurance approved blood pressure device with L-XL cuff. BMI 55 (Patient taking differently: 1 each by Other route See admin instructions. Please provide patient with insurance approved blood pressure device with L-XL cuff. BMI 55) 1 each 0  ° carboxymethylcellulose (REFRESH PLUS) 0.5 % SOLN Place 1 drop into both eyes daily.    ° diltiazem (CARDIZEM CD) 360 MG 24 hr capsule Take 1 capsule (360 mg total) by mouth daily. 90 capsule 3  ° doxazosin (CARDURA) 2 MG tablet TAKE 1 TABLET (2 MG TOTAL) BY MOUTH DAILY(NOON) 30 tablet 3  ° doxycycline (VIBRAMYCIN) 100 MG capsule Take 1 capsule (100 mg total) by mouth 2 (two) times daily. 20 capsule 0  ° ferrous sulfate (FEROSUL) 325 (65 FE) MG tablet TAKE 1 TABLET (325 MG  TOTAL) BY MOUTH DAILY (NOON) 30 tablet 0  ° glucose blood (TRUE METRIX BLOOD GLUCOSE TEST) test strip Use as instructed. Check blood glucose level by fingerstick twice per day. 100 each 12  ° losartan (COZAAR) 50 MG tablet Take 1 tablet (50 mg total) by mouth daily. 90 tablet 3  ° Lumateperone Tosylate (CAPLYTA) 42 MG CAPS Take 42 mg by mouth at bedtime. 30 capsule   ° magnesium oxide (MAG-OX) 400 (240 Mg) MG tablet TAKE 2 TABLETS BY MOUTH EVERY MORNING AND 1 TABLET EVERY EVENING ( NOON+PM+BEDTIME) 90 tablet 2  ° metFORMIN (GLUCOPHAGE) 500 MG tablet TAKE ONE TABLET BY MOUTH TWICE DAILY WITH A MEAL (NOON+BEDTIME) 180 tablet 0  ° Misc. Devices MISC Requires O2 @ 3L/min continuously via nasal canula and home fill system,. (Patient taking differently: 1 each by Other route See admin instructions. Requires O2 @ 3L/min continuously via nasal canula and home fill system,.) 1 each 0  °   Misc. Devices MISC Please provide Benjamin [name] with insurance approved portable O2 concentrator ICD 10 J96.11 [phone] (Patient taking differently: 1 each by Other route See admin instructions. Please provide Benjamin [name] with insurance approved portable O2 concentrator ICD 10 J96.11 Z99.81) 1 each 0   Misc. Devices MISC Please provide patient with insurance approved LARGE right hand splint ICD 10 G56.01 1 each 0   Misc. Devices MISC Please provide patient wit insurance approved diabetic custom molded shoes (QTY 2) and custom molded multi density inserts (1 for each shoe). 1 each 0   omeprazole (PRILOSEC) 20 MG capsule TAKE 1 CAPSULE 2 (TWO) TIMES DAILY BEFORE A MEAL. TAKE FIRST CAPSULE 30 MIN PRIOR TO EATING OR TAKING OTHER MEDICATIONS. (AM+BEDTIME) 180 capsule 1   potassium chloride (KLOR-CON) 10 MEQ tablet TAKE 8 TABLETS (80 MEQ TOTAL) BY MOUTH DAILY. 240 tablet 3   PROVENTIL HFA 108 (90 Base) MCG/ACT inhaler INHALE 2 PUFFS BY MOUTH EVERY 6 (SIX) HOURS AS NEEDED FOR WHEEZING OR SHORTNESS OF BREATH. (Patient taking differently: Inhale 2  puffs into the lungs every 6 (six) hours as needed for wheezing or shortness of breath.) 18 g 6   rivaroxaban (XARELTO) 20 MG TABS tablet TAKE 1 TABLET BY MOUTH DAILY WITH SUPPER (BEDTIME) 90 tablet 1   spironolactone (ALDACTONE) 50 MG tablet TAKE 1 TABLET (50 MG TOTAL) BY MOUTH DAILY (NOON) 30 tablet 11   STOOL SOFTENER 100 MG capsule TAKE 1 CAPSULE BY MOUTH 2 (TWO) TIMES DAILY (NOON+BEDTIME) 60 capsule 0   torsemide (DEMADEX) 20 MG tablet Take 4 tablets (80 mg total) by mouth 2 (two) times daily for 5 days, THEN 2 tablets (40 mg total) 2 (two) times daily. 240 tablet 3   TRUEplus Lancets 28G MISC Use as instructed. Check blood glucose level by fingerstick twice per day. 200 each 3   No current facility-administered medications for this visit.   No Known Allergies  Social History   Socioeconomic History   Marital status: Single    Spouse name: Not on file   Number of children: Not on file   Years of education: Not on file   Highest education level: Not on file  Occupational History   Not on file  Tobacco Use   Smoking status: Former   Smokeless tobacco: Never   Tobacco comments:    smoked weed in the past  Vaping Use   Vaping Use: Never used  Substance and Sexual Activity   Alcohol use: Not Currently   Drug use: Yes    Types: Marijuana, Cocaine   Sexual activity: Not Currently  Other Topics Concern   Not on file  Social History Narrative   Not on file   Social Determinants of Health   Financial Resource Strain: High Risk   Difficulty of Paying Living Expenses: Hard  Food Insecurity: No Food Insecurity   Worried About Running Out of Food in the Last Year: Never true   Ran Out of Food in the Last Year: Never true  Transportation Needs: No Transportation Needs   Lack of Transportation (Medical): No   Lack of Transportation (Non-Medical): No  Physical Activity: Not on file  Stress: Not on file  Social Connections: Not on file  Intimate Partner Violence: Not on file    Family History  Problem Relation Age of Onset   Hypertension Mother    Diabetes Mother    There were no vitals taken for this visit.  Wt Readings from Last 3 Encounters:  10/19/21 Marland Kitchen)  167 kg (368 lb 3.2 oz)  01/21/21 (!) 161.3 kg (355 lb 9.6 oz)  01/10/21 (!) 163.8 kg (361 lb 3.2 oz)   PHYSICAL EXAM: General:  NAD. No resp difficulty, falling asleep during exam. HEENT: Normal Neck: Supple. thick neck. Carotids 2+ bilat; no bruits. No lymphadenopathy or thryomegaly appreciated. Cor: PMI nondisplaced. Regular rate & rhythm. No rubs, gallops or murmurs. Lungs: Clear Abdomen: Obese nontender, nondistended. No hepatosplenomegaly. No bruits or masses. Good bowel sounds. Extremities: No cyanosis, clubbing, rash, 2+ BLE edema, + varicosities to BLE Neuro: Alert & oriented x 3, cranial nerves grossly intact. Moves all 4 extremities w/o difficulty. Affect pleasant.  ASSESSMENT & PLAN: 1. Chronic diastolic CHF: Echo in 69/40 with EF 65-70%, RV interpreted as normal but suspect significant RV dysfunction.  - NYHA III- IIIb, functional status difficult due to generalized arthritic pain. He is volume overloaded on exam, weight up 13 lbs. - Discussed IV lasix in clinic, he would like to try orals. - Increase torsemide to 80 mg bid + 80 KCL daily. - Continue spironolactone 50 mg daily.  - Continue hydralazine 50 mg tid. - Continue losartan 50 mg daily. - Continue to weigh daily and watch salt/fluid intake. - Repeat echo has been ordered. - BMET and BNP today.  2. Atrial fibrillation: Paroxysmal.  In NSR today.  - Continue current dose of diltiazem.  - Continue Xarelto. No bleeding issues. - CBC today.  3. H/o PE: 5/20, continue Xarelto.   4. OHS/OSA: Now on Trelegy. - Continue home O2.  5. HTN: Elevated today. Diurese as above. - Continue current regimen.   6. DM - Per PCP.  7. Obesity - There is no height or weight on file to calculate BMI.   8. Substance abuse: Last used  cocaine 5 days ago. Discussed cessation from cocaine. He knows he needs to quit. HFSW available should he decide on treatment.  9. SDOH - He has a car and a place to live. - No phone, but will have one soon. GF's # updated in Epic, we can call her to get in touch with patient until new phone arrives. - He has a scale and a PCP. - Meds thru [name] in bubble packs. - HFSW helping.  Follow up in 2 weeks with APP (check BMET).  [place], [place] 11/01/2021 8:07 AM

## 2021-11-02 ENCOUNTER — Ambulatory Visit (HOSPITAL_COMMUNITY)
Admission: RE | Admit: 2021-11-02 | Discharge: 2021-11-02 | Disposition: A | Discharge status: Home / Self Care | Payer: Medicaid Other | Source: Ambulatory Visit | Attending: Family Medicine | Admitting: Family Medicine

## 2021-11-02 ENCOUNTER — Encounter (HOSPITAL_COMMUNITY): Payer: Medicaid Other

## 2021-11-02 ENCOUNTER — Encounter (HOSPITAL_COMMUNITY): Payer: Self-pay

## 2021-11-02 ENCOUNTER — Telehealth (HOSPITAL_COMMUNITY): Payer: Self-pay | Admitting: *Deleted

## 2021-11-02 ENCOUNTER — Other Ambulatory Visit: Payer: Self-pay

## 2021-11-02 VITALS — BP 140/80 | HR 95 | Wt 362.0 lb

## 2021-11-02 DIAGNOSIS — D649 Anemia, unspecified: Secondary | ICD-10-CM | POA: Insufficient documentation

## 2021-11-02 DIAGNOSIS — E782 Mixed hyperlipidemia: Secondary | ICD-10-CM

## 2021-11-02 DIAGNOSIS — Z9981 Dependence on supplemental oxygen: Secondary | ICD-10-CM | POA: Diagnosis not present

## 2021-11-02 DIAGNOSIS — R52 Pain, unspecified: Secondary | ICD-10-CM | POA: Insufficient documentation

## 2021-11-02 DIAGNOSIS — Z9114 Patient's other noncompliance with medication regimen: Secondary | ICD-10-CM | POA: Diagnosis not present

## 2021-11-02 DIAGNOSIS — Z86711 Personal history of pulmonary embolism: Secondary | ICD-10-CM | POA: Diagnosis not present

## 2021-11-02 DIAGNOSIS — Z7901 Long term (current) use of anticoagulants: Secondary | ICD-10-CM | POA: Diagnosis not present

## 2021-11-02 DIAGNOSIS — K219 Gastro-esophageal reflux disease without esophagitis: Secondary | ICD-10-CM | POA: Diagnosis not present

## 2021-11-02 DIAGNOSIS — I480 Paroxysmal atrial fibrillation: Secondary | ICD-10-CM

## 2021-11-02 DIAGNOSIS — J9611 Chronic respiratory failure with hypoxia: Secondary | ICD-10-CM | POA: Diagnosis not present

## 2021-11-02 DIAGNOSIS — F1090 Alcohol use, unspecified, uncomplicated: Secondary | ICD-10-CM | POA: Insufficient documentation

## 2021-11-02 DIAGNOSIS — Z8719 Personal history of other diseases of the digestive system: Secondary | ICD-10-CM | POA: Insufficient documentation

## 2021-11-02 DIAGNOSIS — E662 Morbid (severe) obesity with alveolar hypoventilation: Secondary | ICD-10-CM | POA: Insufficient documentation

## 2021-11-02 DIAGNOSIS — R778 Other specified abnormalities of plasma proteins: Secondary | ICD-10-CM | POA: Diagnosis not present

## 2021-11-02 DIAGNOSIS — F1910 Other psychoactive substance abuse, uncomplicated: Secondary | ICD-10-CM

## 2021-11-02 DIAGNOSIS — I10 Essential (primary) hypertension: Secondary | ICD-10-CM | POA: Diagnosis not present

## 2021-11-02 DIAGNOSIS — Z79899 Other long term (current) drug therapy: Secondary | ICD-10-CM | POA: Diagnosis not present

## 2021-11-02 DIAGNOSIS — I482 Chronic atrial fibrillation, unspecified: Secondary | ICD-10-CM | POA: Insufficient documentation

## 2021-11-02 DIAGNOSIS — I2699 Other pulmonary embolism without acute cor pulmonale: Secondary | ICD-10-CM

## 2021-11-02 DIAGNOSIS — I110 Hypertensive heart disease with heart failure: Secondary | ICD-10-CM | POA: Insufficient documentation

## 2021-11-02 DIAGNOSIS — F1410 Cocaine abuse, uncomplicated: Secondary | ICD-10-CM | POA: Insufficient documentation

## 2021-11-02 DIAGNOSIS — I509 Heart failure, unspecified: Secondary | ICD-10-CM

## 2021-11-02 DIAGNOSIS — Z634 Disappearance and death of family member: Secondary | ICD-10-CM | POA: Diagnosis not present

## 2021-11-02 DIAGNOSIS — Z7951 Long term (current) use of inhaled steroids: Secondary | ICD-10-CM | POA: Insufficient documentation

## 2021-11-02 DIAGNOSIS — E876 Hypokalemia: Secondary | ICD-10-CM

## 2021-11-02 DIAGNOSIS — J441 Chronic obstructive pulmonary disease with (acute) exacerbation: Secondary | ICD-10-CM | POA: Diagnosis not present

## 2021-11-02 DIAGNOSIS — I5033 Acute on chronic diastolic (congestive) heart failure: Secondary | ICD-10-CM

## 2021-11-02 DIAGNOSIS — G4733 Obstructive sleep apnea (adult) (pediatric): Secondary | ICD-10-CM

## 2021-11-02 DIAGNOSIS — E118 Type 2 diabetes mellitus with unspecified complications: Secondary | ICD-10-CM

## 2021-11-02 DIAGNOSIS — Z9989 Dependence on other enabling machines and devices: Secondary | ICD-10-CM

## 2021-11-02 DIAGNOSIS — E119 Type 2 diabetes mellitus without complications: Secondary | ICD-10-CM | POA: Diagnosis not present

## 2021-11-02 DIAGNOSIS — M7989 Other specified soft tissue disorders: Secondary | ICD-10-CM | POA: Diagnosis not present

## 2021-11-02 DIAGNOSIS — F1290 Cannabis use, unspecified, uncomplicated: Secondary | ICD-10-CM | POA: Diagnosis not present

## 2021-11-02 DIAGNOSIS — Z8616 Personal history of COVID-19: Secondary | ICD-10-CM | POA: Diagnosis not present

## 2021-11-02 DIAGNOSIS — Z6841 Body Mass Index (BMI) 40.0 and over, adult: Secondary | ICD-10-CM | POA: Insufficient documentation

## 2021-11-02 DIAGNOSIS — I5032 Chronic diastolic (congestive) heart failure: Secondary | ICD-10-CM | POA: Diagnosis not present

## 2021-11-02 DIAGNOSIS — R1084 Generalized abdominal pain: Secondary | ICD-10-CM

## 2021-11-02 LAB — BASIC METABOLIC PANEL
Anion gap: 9 (ref 5–15)
BUN: 22 mg/dL — ABNORMAL HIGH (ref 6–20)
CO2: 35 mmol/L — ABNORMAL HIGH (ref 22–32)
Calcium: 9.4 mg/dL (ref 8.9–10.3)
Chloride: 92 mmol/L — ABNORMAL LOW (ref 98–111)
Creatinine, Ser: 2.22 mg/dL — ABNORMAL HIGH (ref 0.61–1.24)
GFR, Estimated: 34 mL/min — ABNORMAL LOW (ref >60)
Glucose, Bld: 89 mg/dL (ref 70–99)
Potassium: 3.9 mmol/L (ref 3.5–5.1)
Sodium: 136 mmol/L (ref 135–145)

## 2021-11-02 LAB — BRAIN NATRIURETIC PEPTIDE: B Natriuretic Peptide: 10.6 pg/mL (ref 0.0–100.0)

## 2021-11-02 MED ORDER — TORSEMIDE 20 MG PO TABS
80.0000 mg | ORAL_TABLET | Freq: Once | ORAL | Status: AC
  Administered 2021-11-02: 80 mg via ORAL

## 2021-11-02 MED ORDER — TORSEMIDE 20 MG PO TABS
80.0000 mg | ORAL_TABLET | Freq: Once | ORAL | Status: AC
  Filled 2021-11-02: qty 4

## 2021-11-02 MED ORDER — TORSEMIDE 20 MG PO TABS: 40.0000 mg | ORAL_TABLET | Freq: Two times a day (BID) | ORAL | 11 refills | Status: DC

## 2021-11-02 MED ORDER — DILTIAZEM HCL ER COATED BEADS 360 MG PO CP24: 360.0000 mg | ORAL_CAPSULE | Freq: Every day | ORAL | 3 refills | Status: DC

## 2021-11-02 MED ORDER — POTASSIUM CHLORIDE CRYS ER 20 MEQ PO TBCR
40.0000 meq | EXTENDED_RELEASE_TABLET | Freq: Once | ORAL | Status: AC
  Administered 2021-11-02: 40 meq via ORAL

## 2021-11-02 MED ORDER — DOXAZOSIN MESYLATE 2 MG PO TABS: 2.0000 mg | ORAL_TABLET | Freq: Every day | ORAL | 11 refills | Status: DC

## 2021-11-02 MED ORDER — HYDRALAZINE HCL 50 MG PO TABS: 50.0000 mg | ORAL_TABLET | Freq: Three times a day (TID) | ORAL | 3 refills | Status: DC

## 2021-11-02 MED ORDER — MAGNESIUM OXIDE -MG SUPPLEMENT 400 (240 MG) MG PO TABS: 400.0000 mg | ORAL_TABLET | Freq: Two times a day (BID) | ORAL | 3 refills | Status: DC

## 2021-11-02 MED ORDER — METOLAZONE 2.5 MG PO TABS
2.5000 mg | ORAL_TABLET | Freq: Once | ORAL | Status: AC
  Administered 2021-11-02: 2.5 mg via ORAL

## 2021-11-02 MED ORDER — LOSARTAN POTASSIUM 50 MG PO TABS: 50.0000 mg | ORAL_TABLET | Freq: Every day | ORAL | 3 refills | Status: DC

## 2021-11-02 MED ORDER — RIVAROXABAN 20 MG PO TABS: 20.0000 mg | ORAL_TABLET | Freq: Every day | ORAL | 3 refills | Status: DC

## 2021-11-02 MED ORDER — POTASSIUM CHLORIDE 20 MEQ PO PACK: 40.0000 meq | PACK | Freq: Once | ORAL | Status: DC

## 2021-11-02 MED ORDER — POTASSIUM CHLORIDE ER 10 MEQ PO TBCR: 80.0000 meq | EXTENDED_RELEASE_TABLET | Freq: Every day | ORAL | 11 refills | Status: DC

## 2021-11-02 MED ORDER — ATORVASTATIN CALCIUM 40 MG PO TABS: 40.0000 mg | ORAL_TABLET | Freq: Every day | ORAL | 3 refills | Status: DC

## 2021-11-02 MED ORDER — SPIRONOLACTONE 50 MG PO TABS: 50.0000 mg | ORAL_TABLET | Freq: Every day | ORAL | 11 refills | Status: DC

## 2021-11-02 NOTE — Addendum Note (Signed)
Encounter addended by: Linda Hedges, RN on: 11/02/2021 12:53 PM  Actions taken: Visit diagnoses modified, Order list changed, Diagnosis association updated

## 2021-11-02 NOTE — Addendum Note (Signed)
Encounter addended by: Linda Hedges, RN on: 11/02/2021 12:57 PM  Actions taken: Order list changed, Diagnosis association updated, MAR administration accepted

## 2021-11-02 NOTE — Telephone Encounter (Signed)
Starter form, demographics, ins info, and records faxed into [employer] for SQ Lasix approval.

## 2021-11-02 NOTE — Patient Instructions (Signed)
Medication Changes:  RESTART ALL YOUR MEDICATIONS. They will be delivered tomorrow   Lab Work:  Labs done today, your results will be available in MyChart, we will contact you for abnormal readings.   Testing/Procedures:  none  Referrals:  none  Special Instructions // Education:  Do the following things EVERYDAY: Weigh yourself in the morning before breakfast. Write it down and keep it in a log. Take your medicines as prescribed Eat low salt foods--Limit salt (sodium) to 2000 mg per day.  Stay as active as you can everyday Limit all fluids for the day to less than 2 liters   Follow-Up in: 2 weeks with Shanda Bumps and 3 months with Dr. Shirlee Latch  At the [facility], you and your health needs are our priority. We have a designated team specialized in the treatment of Heart Failure. This Care Team includes your primary Heart Failure Specialized Cardiologist (physician), Advanced Practice Providers (APPs- Physician Assistants and Nurse Practitioners), and Pharmacist who all work together to provide you with the care you need, when you need it.   You may see any of the following providers on your designated Care Team at your next follow up:  Dr Arvilla Meres Dr Carron Curie, NP Robbie Lis, [place] [facility] [place], [place] Karle Plumber, PharmD   Please be sure to bring in all your medications bottles to every appointment.   Need to Contact us:  If you have any questions or concerns before your next appointment please send [place] a message through [place] or call our office at [phone].    TO LEAVE A MESSAGE FOR THE NURSE SELECT OPTION 2, PLEASE LEAVE A MESSAGE INCLUDING: YOUR NAME DATE OF BIRTH CALL BACK NUMBER REASON FOR CALL**this is important as we prioritize the call backs  YOU WILL RECEIVE A CALL BACK THE SAME DAY AS LONG AS YOU CALL BEFORE 4:00 PM

## 2021-11-02 NOTE — Progress Notes (Signed)
Advanced Heart Failure Clinic Note   PCP: [name], NP PCP-Cardiologist: Dorris Carnes, MD  [facility]: Dr. Aundra Dubin   HPI: [name] is a 56 yo with hx of diastolic CHF, HTN, DM, Atrial fib, PE in April 2020 (on Xarelto), OSA, GERD, chronic elevation of troponin.    Admitted 01/21/2019 with increased dyspnea and had PE. He was anticoagulated.    Patient re-admitted 5/20 for syncope/cough/dyspnea, COVID+, CT positive for bilateral GGOs, admitted to [facility] and discharged after 6 days, on O2.  Rec'd Cards f/u.   Patient re-admitted 02/20/19 for bright red blood per rectum, eval'ed by GI, had EGD/colon, dx'd with hemorrhoidal bleeding, discharged after 9 days on O2. He was again Covid + .   Readmitted 03/11/19 with increased shortness of breath. He did not have home oxygen. He was discharged to home the next day.    He presented to [facility] ED 04/11/19 with lower extremity edema. COVID negative. Diuresed with lasix drip and transitioned to torsemide 40 mg twice daily. Had [place] with mild volume overload and preserved cardiac output. Discharge weight was 407.9 pounds.    He was readmitted with symptomatic anemia 05/28/19 with hgb 7.1. Received 1UPRBCs. GI consulted. He started on anusol and continued on stool softner.    He was readmitted to the hospital on 07/16/19 when he presented to the ED w/ worsening symptoms and was admitted by IM for a/c diastolic CHF and treated w/ IV Lasix. Had 2 day hospital stay and was discharged on 10/29. Echo was done 10/28 and showed normal LVEF 65-70%, RV interpreted as normal but suspect significant RV dysfunction.  After diuresis w/ IV Lasix, he was placed back on home diuretic regimen, torsemide 100 mg bid. Lisinopril 5 mg was also added to regimen. AHF team was not consulted that admit.   He was seen back in clinic 07/30/19. Felt poorly. SOB at rest and worse w/ exertion. Also w/ orthopnea/PND. Significant wt gain of 20 lb up to 428 lb (dry wt ~407 lb), w/ marked abdominal  distention and LEE and poor response to IV diuretics.  Once diuresed transitioned to torsemide 100 mg twice a day. D/C 408 pounds.   Zio patch on 10/20/19-No arrhythmia.   Re-admitted 08/19/20-08/24/20 for  A fib RVR. A. fib RVR in the setting of noncompliance ethanol and cocaine. Cardizem started and labetalol continued.  12/3 TEE (EF 60-65%) with DCCV successfully converted to SR. Diuresed with IV lasix and had AKI in setting of over diuresis. SCr 1.59 on admit, bumped to 2.5. Diuretics held + IVF hydration.   Admitted 12/24/20 with hypertensive emergency and respiratory failure. .   Admitted 422/22 with A Fib RVR & AMS. Diltiazem increased to 360 mg daily. Chemically converted to NSR.  + Cocaine.   Seen in ED 11/22 for COPD exacerbation.  DC'd from paramedicine due to lack of communication.  Follow up 1/23 he had not been seen since 5/22. He appeared volume overloaded and torsemide increased.   Today he returns for HF follow up. He increased torsemide last visit however he tells me he has been off of all of his meds x 1 week. He is more SOB today. Legs are swollen. Remains on home 3L O2 but arrived in clinic without his oxygen, left it in transport car. Using Trelegy. Last used cocaine 1 week ago. Limited by generalized pain. Denies CP, abnormal bleeding, palpitations, dizziness, or PND/Orthopnea. Appetite ok. No fever or chills.   ECG (personally reviewed): none ordered today.  Labs (  11/20): BNP 32, K 3.6, creatinine 1.09  Labs (12/20): K 3.3, creatinine 1.28 Labs (10/10/19): K 3.9 Creatinine 1.4  Labs (11/05/19) : K 3.2 Creatinine 1.47  Labs (12/30/2019) : K 3.5 Creatinine 1.14  Labs (03/02/2020): K 3.8 Creatinine 1.09.  Labs (12/21): K 4.0, Creatinine 1.17, Magnesium 1.6 Labs (09/15/20): K 3.1, Creatinine 2.54 Labs (1/22): K 3.1, creatinine 1.56 Labs (5/22): K 3.6, creatinine 1.89 Labs (1/23): K 3.9, creatinine 1.34  Review of Systems: All systems reviewed and negative except as per HPI.    PMH: 1. Atrial fibrillation: Paroxysmal.  - DCCV (08/21/20): converted to SR 2. Pulmonary embolus: 5/20.  3. OHS/OSA: He is on home oxygen during the day and uses CPAP at night.  4. Morbid obesity.  5. Chronic diastolic CHF:  - RHC (8/50): mean RA 12, PA 40/25 mean 31, mean PCWP 23, CI 2.47, PVR 1.1 WU.  - Echo (10/20): EF 65-70%, mild LVH, normal RV size and systolic function.  - TEE (12/21): EF 60-65% 6. Type 2 diabetes 7. HTN 8. Rectal bleeding: ?Hemorrhoidal.  9. COVID-19 infection 7/20.  10. ZIo Patch - 10/20/19 no arrhythmias  NST 08/23/20 There was no ST segment deviation noted during stress. The left ventricular ejection fraction is moderately decreased (30-44%). Nuclear stress EF: 43%. This is a low risk study. The study is normal.   Fixed apical inferior perfusion defect with normal wall motion in this area, consistent with artifact  Current Outpatient Medications  Medication Sig Dispense Refill   atorvastatin (LIPITOR) 40 MG tablet TAKE 1 TABLET (40 MG TOTAL) BY MOUTH AT BEDTIME. 90 tablet 0   Blood Glucose Monitoring Suppl (TRUE METRIX METER) w/Device KIT Use as instructed. Check blood glucose level by fingerstick twice per day. 1 kit 0   Blood Pressure Monitor DEVI Please provide patient with insurance approved blood pressure device with L-XL cuff. BMI 55 1 each 0   carboxymethylcellulose (REFRESH PLUS) 0.5 % SOLN Place 1 drop into both eyes daily.     diltiazem (CARDIZEM CD) 360 MG 24 hr capsule Take 1 capsule (360 mg total) by mouth daily. 90 capsule 3   doxazosin (CARDURA) 2 MG tablet TAKE 1 TABLET (2 MG TOTAL) BY MOUTH DAILY(NOON) 30 tablet 3   doxycycline (VIBRAMYCIN) 100 MG capsule Take 1 capsule (100 mg total) by mouth 2 (two) times daily. 20 capsule 0   ferrous sulfate (FEROSUL) 325 (65 FE) MG tablet TAKE 1 TABLET (325 MG TOTAL) BY MOUTH DAILY (NOON) 30 tablet 0   glucose blood (TRUE METRIX BLOOD GLUCOSE TEST) test strip Use as instructed. Check blood glucose  level by fingerstick twice per day. 100 each 12   hydrALAZINE (APRESOLINE) 50 MG tablet TAKE ONE TABLET BY MOUTH THREE TIMES A DAY. (NOON+PM+BEDTIME) 180 tablet 3   magnesium oxide (MAG-OX) 400 (240 Mg) MG tablet TAKE 2 TABLETS BY MOUTH EVERY MORNING AND 1 TABLET EVERY EVENING ( NOON+PM+BEDTIME) 90 tablet 2   metFORMIN (GLUCOPHAGE) 500 MG tablet TAKE ONE TABLET BY MOUTH TWICE DAILY WITH A MEAL (NOON+BEDTIME) 180 tablet 0   Misc. Devices MISC Requires O2 @ 3L/min continuously via nasal canula and home fill system,. 1 each 0   Misc. Devices MISC Please provide Mr. Virani with insurance approved portable O2 concentrator ICD 10 J96.11 Z99.81 1 each 0   Misc. Devices MISC Please provide patient with insurance approved LARGE right hand splint ICD 10 G56.01 1 each 0   Misc. Devices MISC Please provide patient wit insurance approved diabetic custom molded  shoes (QTY 2) and custom molded multi density inserts (1 for each shoe). 1 each 0   omeprazole (PRILOSEC) 20 MG capsule TAKE 1 CAPSULE 2 (TWO) TIMES DAILY BEFORE A MEAL. TAKE FIRST CAPSULE 30 MIN PRIOR TO EATING OR TAKING OTHER MEDICATIONS. (AM+BEDTIME) 180 capsule 1   potassium chloride (KLOR-CON) 10 MEQ tablet TAKE 8 TABLETS (80 MEQ TOTAL) BY MOUTH DAILY. 240 tablet 3   PROVENTIL HFA 108 (90 Base) MCG/ACT inhaler INHALE 2 PUFFS BY MOUTH EVERY 6 (SIX) HOURS AS NEEDED FOR WHEEZING OR SHORTNESS OF BREATH. 18 g 6   rivaroxaban (XARELTO) 20 MG TABS tablet TAKE 1 TABLET BY MOUTH DAILY WITH SUPPER (BEDTIME) 90 tablet 1   spironolactone (ALDACTONE) 50 MG tablet TAKE 1 TABLET (50 MG TOTAL) BY MOUTH DAILY (NOON) 30 tablet 11   STOOL SOFTENER 100 MG capsule TAKE 1 CAPSULE BY MOUTH 2 (TWO) TIMES DAILY (NOON+BEDTIME) 60 capsule 0   torsemide (DEMADEX) 20 MG tablet Take 4 tablets (80 mg total) by mouth 2 (two) times daily for 5 days, THEN 2 tablets (40 mg total) 2 (two) times daily. 240 tablet 3   TRUEplus Lancets 28G MISC Use as instructed. Check blood glucose level  by fingerstick twice per day. 200 each 3   losartan (COZAAR) 50 MG tablet Take 1 tablet (50 mg total) by mouth daily. 90 tablet 3   No current facility-administered medications for this encounter.   No Known Allergies  Social History   Socioeconomic History   Marital status: Single    Spouse name: Not on file   Number of children: Not on file   Years of education: Not on file   Highest education level: Not on file  Occupational History   Not on file  Tobacco Use   Smoking status: Former   Smokeless tobacco: Never   Tobacco comments:    smoked weed in the past  Vaping Use   Vaping Use: Never used  Substance and Sexual Activity   Alcohol use: Not Currently   Drug use: Yes    Types: Marijuana, Cocaine   Sexual activity: Not Currently  Other Topics Concern   Not on file  Social History Narrative   Not on file   Social Determinants of Health   Financial Resource Strain: High Risk   Difficulty of Paying Living Expenses: Hard  Food Insecurity: No Food Insecurity   Worried About Running Out of Food in the Last Year: Never true   Ran Out of Food in the Last Year: Never true  Transportation Needs: No Transportation Needs   Lack of Transportation (Medical): No   Lack of Transportation (Non-Medical): No  Physical Activity: Not on file  Stress: Not on file  Social Connections: Not on file  Intimate Partner Violence: Not on file   Family History  Problem Relation Age of Onset   Hypertension Mother    Diabetes Mother    BP 140/80    Pulse 95    Wt (!) 164.2 kg (362 lb)    SpO2 (!) 88%    BMI 50.49 kg/m   Wt Readings from Last 3 Encounters:  11/02/21 (!) 164.2 kg (362 lb)  10/19/21 (!) 167 kg (368 lb 3.2 oz)  01/21/21 (!) 161.3 kg (355 lb 9.6 oz)   PHYSICAL EXAM: General:  SOB with speaking short sentences arrived in WC HEENT: Normal Neck: Supple. Thick neck. Carotids 2+ bilat; no bruits. No lymphadenopathy or thryomegaly appreciated. Cor: PMI nondisplaced. Regular  rate & rhythm. No rubs,  gallops or murmurs. Lungs: Clear Abdomen: Obese, nontender, nondistended. No hepatosplenomegaly. No bruits or masses. Good bowel sounds. Extremities: No cyanosis, clubbing, rash, trace BLE edema; bilateral varicosities. Neuro: Alert & oriented x 3, cranial nerves grossly intact. Moves all 4 extremities w/o difficulty. Affect pleasant.  ASSESSMENT & PLAN: 1. Acute on chronic diastolic CHF: Echo in 94/80 with EF 65-70%, RV interpreted as normal but suspect significant RV dysfunction.  - Worse NYHA IIIb today, functional status difficult due to generalized arthritic pain but he is markedly dyspneic today. Weight is down 6 lbs from last visit. Bubble packs are not able to be delivered until tomorrow. - Give metolazone 2.5 mg + 40 KCL today + torsemide 80 mg today. Take metolazone 2.5 mg + 40 KCL tomorrow. Will start PA for sQ Lasix for future. BMET and BNP today. - Tomorrow, continue torsemide 40 mg bid.  - Restart spironolactone 50 mg daily.  - Restart hydralazine 50 mg tid. - Restart losartan 50 mg daily. - Continue to weigh daily and watch salt/fluid intake. - Repeat echo has been ordered. Needs to schedule this.  2. Atrial fibrillation: Paroxysmal.  Regular on exam today. - Continue current dose of diltiazem.  - Continue Xarelto. No bleeding issues.  3. H/o PE: 5/20, continue Xarelto.   4. OHS/OSA: Now on Trelegy. - Continue home O2.  5. HTN: Elevated today. Restart meds as above and diurese.  6. DM - Per PCP.  7. Obesity - Body mass index is 50.49 kg/m.   8. Substance abuse: Last used cocaine 1 week ago. Discussed cessation from cocaine. He knows he needs to quit. HFSW available should he decide on treatment.  9. SDOH - He has a car and a place to live. - He has a scale and a PCP. - Meds thru [name] in bubble packs. - HFSW helping. Needs paramedicine on board again.  Follow up on Friday with APP and 3 months with Dr. Aundra Dubin.   [place],  FNP 11/02/2021 11:54 AM

## 2021-11-03 ENCOUNTER — Other Ambulatory Visit: Payer: Self-pay | Admitting: Family Medicine

## 2021-11-03 ENCOUNTER — Telehealth (HOSPITAL_COMMUNITY): Payer: Self-pay | Admitting: Licensed Clinical Social Worker

## 2021-11-03 NOTE — Telephone Encounter (Signed)
CSW consulted to get pt back enrolled in [employer] program after he came back into clinic yesterday and reported he was not taking medications for a week and volume overloaded.  CSW sent out referral- will be assigned to [facility].  Will continue to follow and assist as needed  [name], LCSW Clinical Social Worker Advanced Heart Failure Clinic Desk#: [phone] Cell#: [phone]

## 2021-11-05 ENCOUNTER — Encounter (HOSPITAL_COMMUNITY): Payer: Self-pay

## 2021-11-05 ENCOUNTER — Other Ambulatory Visit: Payer: Self-pay

## 2021-11-05 ENCOUNTER — Ambulatory Visit (HOSPITAL_COMMUNITY)
Admission: RE | Admit: 2021-11-05 | Discharge: 2021-11-05 | Disposition: A | Discharge status: Home / Self Care | Payer: Medicaid Other | Source: Ambulatory Visit | Attending: Family Medicine | Admitting: Family Medicine

## 2021-11-05 VITALS — BP 150/80 | HR 99 | Wt 364.6 lb

## 2021-11-05 DIAGNOSIS — E119 Type 2 diabetes mellitus without complications: Secondary | ICD-10-CM | POA: Insufficient documentation

## 2021-11-05 DIAGNOSIS — I480 Paroxysmal atrial fibrillation: Secondary | ICD-10-CM | POA: Diagnosis not present

## 2021-11-05 DIAGNOSIS — E118 Type 2 diabetes mellitus with unspecified complications: Secondary | ICD-10-CM

## 2021-11-05 DIAGNOSIS — I5032 Chronic diastolic (congestive) heart failure: Secondary | ICD-10-CM | POA: Diagnosis not present

## 2021-11-05 DIAGNOSIS — E669 Obesity, unspecified: Secondary | ICD-10-CM | POA: Diagnosis not present

## 2021-11-05 DIAGNOSIS — F1910 Other psychoactive substance abuse, uncomplicated: Secondary | ICD-10-CM

## 2021-11-05 DIAGNOSIS — Z7901 Long term (current) use of anticoagulants: Secondary | ICD-10-CM | POA: Diagnosis not present

## 2021-11-05 DIAGNOSIS — Z79899 Other long term (current) drug therapy: Secondary | ICD-10-CM | POA: Diagnosis not present

## 2021-11-05 DIAGNOSIS — I5033 Acute on chronic diastolic (congestive) heart failure: Secondary | ICD-10-CM | POA: Insufficient documentation

## 2021-11-05 DIAGNOSIS — I2699 Other pulmonary embolism without acute cor pulmonale: Secondary | ICD-10-CM | POA: Diagnosis not present

## 2021-11-05 DIAGNOSIS — G4733 Obstructive sleep apnea (adult) (pediatric): Secondary | ICD-10-CM | POA: Diagnosis not present

## 2021-11-05 DIAGNOSIS — Z86711 Personal history of pulmonary embolism: Secondary | ICD-10-CM | POA: Insufficient documentation

## 2021-11-05 DIAGNOSIS — Z6841 Body Mass Index (BMI) 40.0 and over, adult: Secondary | ICD-10-CM | POA: Diagnosis not present

## 2021-11-05 DIAGNOSIS — I509 Heart failure, unspecified: Secondary | ICD-10-CM

## 2021-11-05 DIAGNOSIS — Z8616 Personal history of COVID-19: Secondary | ICD-10-CM | POA: Diagnosis not present

## 2021-11-05 DIAGNOSIS — I110 Hypertensive heart disease with heart failure: Secondary | ICD-10-CM | POA: Insufficient documentation

## 2021-11-05 DIAGNOSIS — Z9989 Dependence on other enabling machines and devices: Secondary | ICD-10-CM

## 2021-11-05 DIAGNOSIS — I10 Essential (primary) hypertension: Secondary | ICD-10-CM

## 2021-11-05 LAB — BASIC METABOLIC PANEL
Anion gap: 10 (ref 5–15)
BUN: 47 mg/dL — ABNORMAL HIGH (ref 6–20)
CO2: 35 mmol/L — ABNORMAL HIGH (ref 22–32)
Calcium: 9.3 mg/dL (ref 8.9–10.3)
Chloride: 88 mmol/L — ABNORMAL LOW (ref 98–111)
Creatinine, Ser: 3.44 mg/dL — ABNORMAL HIGH (ref 0.61–1.24)
GFR, Estimated: 20 mL/min — ABNORMAL LOW (ref >60)
Glucose, Bld: 114 mg/dL — ABNORMAL HIGH (ref 70–99)
Potassium: 3.7 mmol/L (ref 3.5–5.1)
Sodium: 133 mmol/L — ABNORMAL LOW (ref 135–145)

## 2021-11-05 NOTE — Addendum Note (Signed)
Encounter addended by: Jacklynn Ganong, FNP on: 11/05/2021 4:39 PM  Actions taken: Clinical Note Signed

## 2021-11-05 NOTE — Progress Notes (Addendum)
Advanced Heart Failure Clinic Note   PCP: [name], NP PCP-Cardiologist: Dorris Carnes, MD  [facility]: Dr. Aundra Dubin   HPI: Benjamin [name] is a 56 yo with hx of diastolic CHF, HTN, DM, Atrial fib, PE in April 2020 (on Xarelto), OSA, GERD, chronic elevation of troponin.    Admitted 01/21/2019 with increased dyspnea and had PE. He was anticoagulated.    Patient re-admitted 5/20 for syncope/cough/dyspnea, COVID+, CT positive for bilateral GGOs, admitted to [facility] and discharged after 6 days, on O2.  Rec'd Cards f/u.   Patient re-admitted 02/20/19 for bright red blood per rectum, eval'ed by GI, had EGD/colon, dx'd with hemorrhoidal bleeding, discharged after 9 days on O2. He was again Covid + .   Readmitted 03/11/19 with increased shortness of breath. He did not have home oxygen. He was discharged to home the next day.    He presented to [facility] ED 04/11/19 with lower extremity edema. COVID negative. Diuresed with lasix drip and transitioned to torsemide 40 mg twice daily. Had [place] with mild volume overload and preserved cardiac output. Discharge weight was 407.9 pounds.    He was readmitted with symptomatic anemia 05/28/19 with hgb 7.1. Received 1UPRBCs. GI consulted. He started on anusol and continued on stool softner.    He was readmitted to the hospital on 07/16/19 when he presented to the ED w/ worsening symptoms and was admitted by IM for a/c diastolic CHF and treated w/ IV Lasix. Had 2 day hospital stay and was discharged on 10/29. Echo was done 10/28 and showed normal LVEF 65-70%, RV interpreted as normal but suspect significant RV dysfunction.  After diuresis w/ IV Lasix, he was placed back on home diuretic regimen, torsemide 100 mg bid. Lisinopril 5 mg was also added to regimen. AHF team was not consulted that admit.   He was seen back in clinic 07/30/19. Felt poorly. SOB at rest and worse w/ exertion. Also w/ orthopnea/PND. Significant wt gain of 20 lb up to 428 lb (dry wt ~407 lb), w/ marked abdominal  distention and LEE and poor response to IV diuretics.  Once diuresed transitioned to torsemide 100 mg twice a day. D/C 408 pounds.   Zio patch on 10/20/19-No arrhythmia.   Re-admitted 08/19/20-08/24/20 for  A fib RVR. A. fib RVR in the setting of noncompliance ethanol and cocaine. Cardizem started and labetalol continued.  12/3 TEE (EF 60-65%) with DCCV successfully converted to SR. Diuresed with IV lasix and had AKI in setting of over diuresis. SCr 1.59 on admit, bumped to 2.5. Diuretics held + IVF hydration.   Admitted 12/24/20 with hypertensive emergency and respiratory failure. .   Admitted 422/22 with A Fib RVR & AMS. Diltiazem increased to 360 mg daily. Chemically converted to NSR.  + Cocaine.   Seen in ED 11/22 for COPD exacerbation.  DC'd from paramedicine due to lack of communication.  Follow up 1/23 he had not been seen since 5/22. He appeared volume overloaded and torsemide increased.   Seen 11/02/21 and had been off all meds x 1 week, volume overloaded. Meds restarted and instructed to take metolazone 2.5 x 2 days w/ extra 40 kcl, however labs showed elevated BUN/SCr and left message to take metolazone x 1 day.    Today he returns for HF follow up. He feels much better today. He took metolazone for 2 days, did not receive message in time to limit to only one dose. Only complaint is some cramping in his side after taking metolazone. He is not  dyspneic walking short distances on flat ground. Remains on 3L oxygen and wears his Trelegy. No further cocaine use in the past week. Denies CP, dizziness, edema, or PND/Orthopnea. Appetite ok. No fever or chills. Not weighing at home. Taking all medications. Continues to have generalized pain. Enrolled in outpatient substance abuse counseling and has connected with a psychiatrist.  ECG (personally reviewed): none ordered today.  Labs (11/20): BNP 32, K 3.6, creatinine 1.09  Labs (12/20): K 3.3, creatinine 1.28 Labs (10/10/19): K 3.9 Creatinine 1.4   Labs (11/05/19) : K 3.2 Creatinine 1.47  Labs (12/30/2019) : K 3.5 Creatinine 1.14  Labs (03/02/2020): K 3.8 Creatinine 1.09.  Labs (12/21): K 4.0, Creatinine 1.17, Magnesium 1.6 Labs (09/15/20): K 3.1, Creatinine 2.54 Labs (1/22): K 3.1, creatinine 1.56 Labs (5/22): K 3.6, creatinine 1.89 Labs (1/23): K 3.9, creatinine 1.34  Review of Systems: All systems reviewed and negative except as per HPI.   PMH: 1. Atrial fibrillation: Paroxysmal.  - DCCV (08/21/20): converted to SR 2. Pulmonary embolus: 5/20.  3. OHS/OSA: He is on home oxygen during the day and uses CPAP at night.  4. Morbid obesity.  5. Chronic diastolic CHF:  - RHC (9/15): mean RA 12, PA 40/25 mean 31, mean PCWP 23, CI 2.47, PVR 1.1 WU.  - Echo (10/20): EF 65-70%, mild LVH, normal RV size and systolic function.  - TEE (12/21): EF 60-65% 6. Type 2 diabetes 7. HTN 8. Rectal bleeding: ?Hemorrhoidal.  9. COVID-19 infection 7/20.  10. ZIo Patch - 10/20/19 no arrhythmias  NST 08/23/20 There was no ST segment deviation noted during stress. The left ventricular ejection fraction is moderately decreased (30-44%). Nuclear stress EF: 43%. This is a low risk study. The study is normal.   Fixed apical inferior perfusion defect with normal wall motion in this area, consistent with artifact  Current Outpatient Medications  Medication Sig Dispense Refill   atorvastatin (LIPITOR) 40 MG tablet Take 1 tablet (40 mg total) by mouth at bedtime. 90 tablet 3   Blood Glucose Monitoring Suppl (TRUE METRIX METER) w/Device KIT Use as instructed. Check blood glucose level by fingerstick twice per day. 1 kit 0   Blood Pressure Monitor DEVI Please provide patient with insurance approved blood pressure device with L-XL cuff. BMI 55 1 each 0   carboxymethylcellulose (REFRESH PLUS) 0.5 % SOLN Place 1 drop into both eyes daily.     diltiazem (CARDIZEM CD) 360 MG 24 hr capsule Take 1 capsule (360 mg total) by mouth daily. 90 capsule 3   doxazosin  (CARDURA) 2 MG tablet Take 1 tablet (2 mg total) by mouth daily. 30 tablet 11   doxycycline (VIBRAMYCIN) 100 MG capsule Take 1 capsule (100 mg total) by mouth 2 (two) times daily. 20 capsule 0   ferrous sulfate (FEROSUL) 325 (65 FE) MG tablet TAKE 1 TABLET (325 MG TOTAL) BY MOUTH DAILY (NOON) 30 tablet 0   glucose blood (TRUE METRIX BLOOD GLUCOSE TEST) test strip Use as instructed. Check blood glucose level by fingerstick twice per day. 100 each 12   hydrALAZINE (APRESOLINE) 50 MG tablet Take 1 tablet (50 mg total) by mouth 3 (three) times daily. 180 tablet 3   losartan (COZAAR) 50 MG tablet Take 1 tablet (50 mg total) by mouth daily. 90 tablet 3   magnesium oxide (MAG-OX) 400 (240 Mg) MG tablet Take 1 tablet (400 mg total) by mouth 2 (two) times daily. 90 tablet 3   metFORMIN (GLUCOPHAGE) 500 MG tablet TAKE ONE TABLET BY  MOUTH TWICE DAILY WITH A MEAL (NOON+BEDTIME) 180 tablet 0   Misc. Devices MISC Requires O2 @ 3L/min continuously via nasal canula and home fill system,. 1 each 0   Misc. Devices MISC Please provide Benjamin [name] with insurance approved portable O2 concentrator ICD 10 J96.11 Z99.81 1 each 0   Misc. Devices MISC Please provide patient with insurance approved LARGE right hand splint ICD 10 G56.01 1 each 0   Misc. Devices MISC Please provide patient wit insurance approved diabetic custom molded shoes (QTY 2) and custom molded multi density inserts (1 for each shoe). 1 each 0   omeprazole (PRILOSEC) 20 MG capsule TAKE 1 CAPSULE 2 (TWO) TIMES DAILY BEFORE A MEAL. TAKE FIRST CAPSULE 30 MIN PRIOR TO EATING OR TAKING OTHER MEDICATIONS. (AM+BEDTIME) 180 capsule 1   potassium chloride (KLOR-CON) 10 MEQ tablet Take 8 tablets (80 mEq total) by mouth daily. 240 tablet 11   PROVENTIL HFA 108 (90 Base) MCG/ACT inhaler INHALE 2 PUFFS BY MOUTH EVERY 6 (SIX) HOURS AS NEEDED FOR WHEEZING OR SHORTNESS OF BREATH. 18 g 6   rivaroxaban (XARELTO) 20 MG TABS tablet Take 1 tablet (20 mg total) by mouth daily  with supper. 90 tablet 3   spironolactone (ALDACTONE) 50 MG tablet Take 1 tablet (50 mg total) by mouth daily. 30 tablet 11   STOOL SOFTENER 100 MG capsule TAKE 1 CAPSULE BY MOUTH 2 (TWO) TIMES DAILY (NOON+BEDTIME) 60 capsule 0   torsemide (DEMADEX) 20 MG tablet Take 2 tablets (40 mg total) by mouth 2 (two) times daily. 60 tablet 11   TRUEplus Lancets 28G MISC Use as instructed. Check blood glucose level by fingerstick twice per day. 200 each 3   No current facility-administered medications for this encounter.   No Known Allergies  Social History   Socioeconomic History   Marital status: Single    Spouse name: Not on file   Number of children: Not on file   Years of education: Not on file   Highest education level: Not on file  Occupational History   Not on file  Tobacco Use   Smoking status: Former   Smokeless tobacco: Never   Tobacco comments:    smoked weed in the past  Vaping Use   Vaping Use: Never used  Substance and Sexual Activity   Alcohol use: Not Currently   Drug use: Yes    Types: Marijuana, Cocaine   Sexual activity: Not Currently  Other Topics Concern   Not on file  Social History Narrative   Not on file   Social Determinants of Health   Financial Resource Strain: High Risk   Difficulty of Paying Living Expenses: Hard  Food Insecurity: No Food Insecurity   Worried About Running Out of Food in the Last Year: Never true   Ran Out of Food in the Last Year: Never true  Transportation Needs: No Transportation Needs   Lack of Transportation (Medical): No   Lack of Transportation (Non-Medical): No  Physical Activity: Not on file  Stress: Not on file  Social Connections: Not on file  Intimate Partner Violence: Not on file   Family History  Problem Relation Age of Onset   Hypertension Mother    Diabetes Mother    BP (!) 150/80    Pulse 99    Wt (!) 165.4 kg (364 lb 9.6 oz)    SpO2 96%    BMI 50.85 kg/m   Wt Readings from Last 3 Encounters:  11/05/21  (!) 165.4 kg (364  lb 9.6 oz)  11/02/21 (!) 164.2 kg (362 lb)  10/19/21 (!) 167 kg (368 lb 3.2 oz)   PHYSICAL EXAM: General:  NAD. No resp difficulty, walked into clinic on oxygen HEENT: Normal Neck: Supple. Thick neck. Carotids 2+ bilat; no bruits. No lymphadenopathy or thryomegaly appreciated. Cor: PMI nondisplaced. Regular rate & rhythm. No rubs, gallops or murmurs. Lungs: Clear Abdomen: Obese, nontender, nondistended. No hepatosplenomegaly. No bruits or masses. Good bowel sounds. Extremities: No cyanosis, clubbing, rash, edema Neuro: Alert & oriented x 3, cranial nerves grossly intact. Moves all 4 extremities w/o difficulty. Affect pleasant.  ASSESSMENT & PLAN: 1. Chronic diastolic CHF: Echo in 50/01 with EF 65-70%, RV interpreted as normal but suspect significant RV dysfunction. Improved NYHA II-early III today, functional status difficult due to generalized arthritic pain but he looks much better today. He does not appear volume overloaded on exam. - Continue torsemide 40 mg bid. BMET today. - PA for sQ Lasix for future use has been initiated. . - Continue spironolactone 50 mg daily.  - Continue hydralazine 50 mg tid. - Continue losartan 50 mg daily. - Continue to weigh daily and watch salt/fluid intake. - Repeat echo has been ordered. Needs to schedule this.  2. Atrial fibrillation: Paroxysmal.  Regular on exam today. - Continue current dose of diltiazem.  - Continue Xarelto. No bleeding issues.  3. H/o PE: 5/20, continue Xarelto.   4. OHS/OSA: Now on Trelegy. - Continue home O2.  5. HTN: Elevated today. Meds as above.  6. DM - Per PCP.  7. Obesity - Body mass index is 50.85 kg/m.   8. Substance abuse: Last used cocaine 1 week ago. He is not attending substance abuse counseling meetings.  9. SDOH - He has a car and a place to live. - He has a scale and a PCP. - Meds thru [place] in bubble packs. - HFSW helping. Needs paramedicine on board again.  Follow  up in 3-4 weeks with APP and 3 months with Dr. Aundra Dubin + echo.  [place], FNP 11/05/2021 2:55 PM

## 2021-11-05 NOTE — Progress Notes (Signed)
CSW checked in with pt during clinic visit.  Pt reporting he is doing well- is going to IOP treatment with [employer] of the [place] 5 days a week and going to church [employer].  Reports being determined to remain sober and remain out of prison.  States he has one negative drug test and hopefully will have another next week to help him prove he is making improvements.  Reports no current needs- CSW will continue to follow and assist as needed  [name], LCSW Clinical Social Worker Advanced Heart Failure Clinic Desk#: [phone] Cell#: [phone]

## 2021-11-05 NOTE — Patient Instructions (Signed)
It was great to see you today! No medication changes are needed at this time.  Labs today We will only contact you if something comes back abnormal or we need to make some changes. Otherwise no news is good news!  Keep cardiology follow up as scheduled  Your physician has requested that you have an echocardiogram. Echocardiography is a painless test that uses sound waves to create images of your heart. It provides your doctor with information about the size and shape of your heart and how well your hearts chambers and valves are working. This procedure takes approximately one hour. There are no restrictions for this procedure.  Do the following things EVERYDAY: Weigh yourself in the morning before breakfast. Write it down and keep it in a log. Take your medicines as prescribed Eat low salt foods--Limit salt (sodium) to 2000 mg per day.  Stay as active as you can everyday Limit all fluids for the day to less than 2 liters  At the [facility], you and your health needs are our priority. As part of our continuing mission to provide you with exceptional heart care, we have created designated Provider Care Teams. These Care Teams include your primary Cardiologist (physician) and Advanced Practice Providers (APPs- Physician Assistants and Nurse Practitioners) who all work together to provide you with the care you need, when you need it.   You may see any of the following providers on your designated Care Team at your next follow up: Dr Glori Bickers Dr Haynes Kerns, NP Lyda Jester, [place] [facility], [place] Audry Riles, PharmD   Please be sure to bring in all your medications bottles to every appointment.

## 2021-11-09 ENCOUNTER — Telehealth (HOSPITAL_COMMUNITY): Payer: Self-pay

## 2021-11-09 NOTE — Telephone Encounter (Signed)
Spoke to Benjamin [name] who reports he is agreeable to home visit on Thursday at 10:00. I reminded him of his PCP appointment for tomorrow at 2:10 at [facility]. I texted him the updated address for same. Call complete.

## 2021-11-10 ENCOUNTER — Ambulatory Visit: Payer: Medicaid Other | Attending: Nurse Practitioner | Admitting: Nurse Practitioner

## 2021-11-11 ENCOUNTER — Telehealth (HOSPITAL_COMMUNITY): Payer: Self-pay | Admitting: Surgery

## 2021-11-11 ENCOUNTER — Other Ambulatory Visit (HOSPITAL_COMMUNITY): Payer: Self-pay

## 2021-11-11 DIAGNOSIS — I5032 Chronic diastolic (congestive) heart failure: Secondary | ICD-10-CM

## 2021-11-11 DIAGNOSIS — F4311 Post-traumatic stress disorder, acute: Secondary | ICD-10-CM | POA: Diagnosis not present

## 2021-11-11 NOTE — Telephone Encounter (Signed)
-----   Message from Rafael Bihari, [place] sent at 11/05/2021  4:30 PM EST ----- SCr elevated after diuresis.   Hold spironolactone, losartan, and torsemide x 2 days. Do not take anymore metolazone.  Repeat BMET in 1 week.

## 2021-11-11 NOTE — Telephone Encounter (Signed)
-----   Message from Rafael Bihari, [place] sent at 11/05/2021  4:30 PM EST ----- SCr elevated after diuresis.   Hold spironolactone, losartan, and torsemide x 2 days. Do not take anymore metolazone.  Repeat BMET in 1 week.

## 2021-11-11 NOTE — Telephone Encounter (Signed)
[facility] community Paramedic called me back and will make home visit to adjust medications for Benjamin [name].  I have scheduled a return appt on Thursday March 2 for repeat labwork.  Medication list updated in [facility].

## 2021-11-11 NOTE — Progress Notes (Signed)
Paramedicine Encounter    Patient ID: Benjamin [name], male    DOB: [birth year], 56 y.o.   MRN: [id number]   Arrived for home visit for Benjamin [name] who reports feeling good with no complaints today. Benjamin [name] reports he has been taking his medicines with no difficulties and has had no chest pain, dizziness or shortness of breath.   I spoke to [facility] at HF clinic and verified med changes from last week and removed medications from bubble packs creating a pill box with those changes:  HOLD TORSEMIDE, LOSARTAN, SPIRONOLACTONE x2 DAYS.   STOP METOLAZONE.   Confirmed Torsemide is 34m BID after he holds for two days.   Ines and I discussed the importance of taking his medicines and how to use the pill box. He understood same.   We reviewed upcoming appointments with labs next Thursday at 1045, and APP visit on 3/8 at 1200. He agreed, and I wrote these down on sticky notes and places them on the fridge for him to remember. He was grateful.  He denied any social barriers right now reporting he is good on his bills and has plenty of food with means of transportation via his own car.   He is inquiring about getting some diabetic shoes, [facility].   Home visit complete. I will see Benjamin [name] one week following labs.     Patient Care Team: FGildardo Pounds NP as PCP - General (Nurse Practitioner) RFay Records MD as PCP - Cardiology (Cardiology) UJorge Ny LCSW as Social Worker (Licensed Clinical Social Worker)  Patient Active Problem List   Diagnosis Date Noted   Atrial fibrillation with RVR ([place] 01/08/2021   Acute respiratory failure ([place] 12/24/2020   Obstructive sleep apnea treated with BiPAP 12/24/2020   Polysubstance abuse (HB[place] 08/20/2020   Alcohol abuse 08/20/2020   Atrial fibrillation with rapid ventricular response ([place] 08/19/2020   Obesity hypoventilation syndrome ([place] 06/12/2020   Healthcare maintenance 06/12/2020    Acute on chronic diastolic heart failure (H[place] 02/24/2020   Diabetic neuropathy (HE[place] 11/20/2019   Coagulation disorder ([place] 11/20/2019   Acute on chronic diastolic CHF (congestive heart failure), NYHA class 3 (HY[place] 07/30/2019   Diabetes mellitus without complication (HInyo 196/22/2979  Hypertensive emergency    BRBPR (bright red blood per rectum) 06/12/2019   Hypertension    CHF (congestive heart failure) (HCC)    Sleep apnea    Chronic respiratory failure (HL[place]    Hypokalemia 05/29/2019   GI bleed 05/29/2019   Anemia 05/28/2019   Iron deficiency anemia 03/11/2019   OSA on CPAP 03/11/2019   HLD (hyperlipidemia) 03/11/2019   Elevated troponin 03/11/2019   GERD (gastroesophageal reflux disease) 03/11/2019   Rectal bleeding 02/20/2019   Dyspnea 02/06/2019   COVID-19 virus infection 02/06/2019   Bilateral lower extremity edema    Morbid obesity with BMI of 50.0-59.9, adult (HCC)    PAF (paroxysmal atrial fibrillation) ([place]    PE (pulmonary thromboembolism) (HR[place] 01/21/2019   Hypertensive urgency 01/21/2019   Diabetes mellitus type 2 in obese (HH[place] 01/21/2019    Current Outpatient Medications:    atorvastatin (LIPITOR) 40 MG tablet, Take 1 tablet (40 mg total) by mouth at bedtime., Disp: 90 tablet, Rfl: 3   Blood Glucose Monitoring Suppl (TRUE METRIX METER) w/Device KIT, Use as instructed. Check blood glucose level by fingerstick twice per day., Disp: 1 kit, Rfl: 0   Blood Pressure Monitor DEVI,  Please provide patient with insurance approved blood pressure device with L-XL cuff. BMI 55, Disp: 1 each, Rfl: 0   carboxymethylcellulose (REFRESH PLUS) 0.5 % SOLN, Place 1 drop into both eyes daily., Disp: , Rfl:    diltiazem (CARDIZEM CD) 360 MG 24 hr capsule, Take 1 capsule (360 mg total) by mouth daily., Disp: 90 capsule, Rfl: 3   doxazosin (CARDURA) 2 MG tablet, Take 1 tablet (2 mg total) by mouth daily., Disp: 30 tablet, Rfl: 11   doxycycline (VIBRAMYCIN) 100 MG capsule, Take 1  capsule (100 mg total) by mouth 2 (two) times daily., Disp: 20 capsule, Rfl: 0   ferrous sulfate (FEROSUL) 325 (65 FE) MG tablet, TAKE 1 TABLET (325 MG TOTAL) BY MOUTH DAILY (NOON), Disp: 30 tablet, Rfl: 0   glucose blood (TRUE METRIX BLOOD GLUCOSE TEST) test strip, Use as instructed. Check blood glucose level by fingerstick twice per day., Disp: 100 each, Rfl: 12   hydrALAZINE (APRESOLINE) 50 MG tablet, Take 1 tablet (50 mg total) by mouth 3 (three) times daily., Disp: 180 tablet, Rfl: 3   losartan (COZAAR) 50 MG tablet, Take 1 tablet (50 mg total) by mouth daily., Disp: 90 tablet, Rfl: 3   magnesium oxide (MAG-OX) 400 (240 Mg) MG tablet, Take 1 tablet (400 mg total) by mouth 2 (two) times daily., Disp: 90 tablet, Rfl: 3   metFORMIN (GLUCOPHAGE) 500 MG tablet, TAKE ONE TABLET BY MOUTH TWICE DAILY WITH A MEAL (NOON+BEDTIME), Disp: 180 tablet, Rfl: 0   Misc. Devices MISC, Requires O2 @ 3L/min continuously via nasal canula and home fill system,., Disp: 1 each, Rfl: 0   Misc. Devices MISC, Please provide Benjamin [name] with insurance approved portable O2 concentrator ICD 10 J96.11 Z99.81, Disp: 1 each, Rfl: 0   Misc. Devices MISC, Please provide patient with insurance approved LARGE right hand splint ICD 10 G56.01, Disp: 1 each, Rfl: 0   Misc. Devices MISC, Please provide patient wit insurance approved diabetic custom molded shoes (QTY 2) and custom molded multi density inserts (1 for each shoe)., Disp: 1 each, Rfl: 0   omeprazole (PRILOSEC) 20 MG capsule, TAKE 1 CAPSULE 2 (TWO) TIMES DAILY BEFORE A MEAL. TAKE FIRST CAPSULE 30 MIN PRIOR TO EATING OR TAKING OTHER MEDICATIONS. (AM+BEDTIME), Disp: 180 capsule, Rfl: 1   potassium chloride (KLOR-CON) 10 MEQ tablet, Take 8 tablets (80 mEq total) by mouth daily., Disp: 240 tablet, Rfl: 11   PROVENTIL HFA 108 (90 Base) MCG/ACT inhaler, INHALE 2 PUFFS BY MOUTH EVERY 6 (SIX) HOURS AS NEEDED FOR WHEEZING OR SHORTNESS OF BREATH., Disp: 18 g, Rfl: 6   rivaroxaban  (XARELTO) 20 MG TABS tablet, Take 1 tablet (20 mg total) by mouth daily with supper., Disp: 90 tablet, Rfl: 3   spironolactone (ALDACTONE) 50 MG tablet, Take 1 tablet (50 mg total) by mouth daily., Disp: 30 tablet, Rfl: 11   STOOL SOFTENER 100 MG capsule, TAKE 1 CAPSULE BY MOUTH 2 (TWO) TIMES DAILY (NOON+BEDTIME), Disp: 60 capsule, Rfl: 0   torsemide (DEMADEX) 20 MG tablet, Take 2 tablets (40 mg total) by mouth 2 (two) times daily., Disp: 60 tablet, Rfl: 11   TRUEplus Lancets 28G MISC, Use as instructed. Check blood glucose level by fingerstick twice per day., Disp: 200 each, Rfl: 3 No Known Allergies   Social History   Socioeconomic History   Marital status: Single    Spouse name: Not on file   Number of children: Not on file   Years of education: Not on file  Highest education level: Not on file  Occupational History   Not on file  Tobacco Use   Smoking status: Former   Smokeless tobacco: Never   Tobacco comments:    smoked weed in the past  Vaping Use   Vaping Use: Never used  Substance and Sexual Activity   Alcohol use: Not Currently   Drug use: Yes    Types: Marijuana, Cocaine   Sexual activity: Not Currently  Other Topics Concern   Not on file  Social History Narrative   Not on file   Social Determinants of Health   Financial Resource Strain: High Risk   Difficulty of Paying Living Expenses: Hard  Food Insecurity: No Food Insecurity   Worried About Running Out of Food in the Last Year: Never true   Ran Out of Food in the Last Year: Never true  Transportation Needs: No Transportation Needs   Lack of Transportation (Medical): No   Lack of Transportation (Non-Medical): No  Physical Activity: Not on file  Stress: Not on file  Social Connections: Not on file  Intimate Partner Violence: Not on file    Physical Exam Vitals reviewed.  Constitutional:      Appearance: Normal appearance. He is obese.  HENT:     Head: Normocephalic.     Nose: Nose normal.      Mouth/Throat:     Mouth: Mucous membranes are moist.     Pharynx: Oropharynx is clear.  Eyes:     Conjunctiva/sclera: Conjunctivae normal.     Pupils: Pupils are equal, round, and reactive to light.  Cardiovascular:     Rate and Rhythm: Normal rate and regular rhythm.     Pulses: Normal pulses.     Heart sounds: Normal heart sounds.  Pulmonary:     Effort: Pulmonary effort is normal.     Breath sounds: Normal breath sounds.  Abdominal:     General: There is distension.     Palpations: Abdomen is soft.  Musculoskeletal:        General: Swelling present. Normal range of motion.     Cervical back: Normal range of motion.     Right lower leg: No edema.     Left lower leg: No edema.  Skin:    General: Skin is warm and dry.     Capillary Refill: Capillary refill takes less than 2 seconds.  Neurological:     General: No focal deficit present.     Mental Status: He is alert. Mental status is at baseline.  Psychiatric:        Mood and Affect: Mood normal.        Future Appointments  Date Time Provider [place]  11/24/2021 12:00 PM [facility] PA/NP [facility] None  01/28/2022  9:15 AM MC ECHO OP 2 [facility]ECHOL[facility]  01/28/2022 10:40 AM Larey Dresser, MD [facility] None     ACTION: Home visit completed

## 2021-11-11 NOTE — Telephone Encounter (Signed)
I contacted patient.  He tells me that his medications are in bubble packs and he does not know which pills are which.  He has a HF Scientist, research (medical) and I have attempted to contact her and left message with her to assist with his medication adjustment.  I will call him back once I speak to the Paramedic.

## 2021-11-17 ENCOUNTER — Telehealth (HOSPITAL_COMMUNITY): Payer: Self-pay

## 2021-11-17 NOTE — Telephone Encounter (Signed)
Left message for Benjamin [name] reminding him of his lab appointments for tomorrow at 1045. I plan to follow up with home visit tomorrow. I will continue reaching out.  ? ?-Text also sent to cell phone.  ?

## 2021-11-18 ENCOUNTER — Other Ambulatory Visit: Payer: Self-pay

## 2021-11-18 ENCOUNTER — Ambulatory Visit (HOSPITAL_COMMUNITY)
Admission: RE | Admit: 2021-11-18 | Discharge: 2021-11-18 | Disposition: A | Discharge status: Home / Self Care | Payer: Medicaid Other | Source: Ambulatory Visit | Attending: Cardiology | Admitting: Cardiology

## 2021-11-18 ENCOUNTER — Telehealth (HOSPITAL_COMMUNITY): Payer: Self-pay

## 2021-11-18 DIAGNOSIS — I5032 Chronic diastolic (congestive) heart failure: Secondary | ICD-10-CM | POA: Diagnosis not present

## 2021-11-18 LAB — BASIC METABOLIC PANEL
Anion gap: 7 (ref 5–15)
BUN: 12 mg/dL (ref 6–20)
CO2: 31 mmol/L (ref 22–32)
Calcium: 8.7 mg/dL — ABNORMAL LOW (ref 8.9–10.3)
Chloride: 101 mmol/L (ref 98–111)
Creatinine, Ser: 1.29 mg/dL — ABNORMAL HIGH (ref 0.61–1.24)
GFR, Estimated: >60 mL/min (ref >60)
Glucose, Bld: 100 mg/dL — ABNORMAL HIGH (ref 70–99)
Potassium: 4 mmol/L (ref 3.5–5.1)
Sodium: 139 mmol/L (ref 135–145)

## 2021-11-18 NOTE — Telephone Encounter (Signed)
Spoke to [place] who reports he went to his lab appointment but can't meet for paramedicine visit today as he has substance abuse group class until 1730. I plan to see him Monday, he agreed. Call complete.  ?

## 2021-11-21 DIAGNOSIS — I5032 Chronic diastolic (congestive) heart failure: Secondary | ICD-10-CM | POA: Diagnosis not present

## 2021-11-21 DIAGNOSIS — J9611 Chronic respiratory failure with hypoxia: Secondary | ICD-10-CM | POA: Diagnosis not present

## 2021-11-22 ENCOUNTER — Other Ambulatory Visit (HOSPITAL_COMMUNITY): Payer: Self-pay

## 2021-11-22 NOTE — Progress Notes (Signed)
Paramedicine Encounter    Patient ID: Benjamin [name], male    DOB: Mar 12, 1966, 56 y.o.   MRN: [id number]   Arrived for home visit for [name] who reports being short of breath, with severe swelling in his lower legs and abdomen since last week. He missed our home visit last week there for he has been without medications since last Thursday. Today he reports he has not been peeing as much and he is more short of breath. Weight is up 16lbs since our last visit. I obtained vitals and assessment as noted. Bilateral edema present with tenderness to both legs. I called HF clinic staff to reports same and Allena Katz NP made changes to Torsemide by increasing same to $Remov'80mg'xaukzl$  BID. He will be seen in clinic on Wednesday at 12:00 on APP side. [name] is aware and understands same. Trev knows that if symptoms worsen to call 911 or me. I will follow up with him in clinic on Weds.    Meds reviewed and confirmed pill box filled.   Refills: NONE     Patient Care Team: [name], NP as PCP - General (Nurse Practitioner) [name], MD as PCP - Cardiology (Cardiology) [name], LCSW as Social Worker (Licensed Clinical Social Worker)  Patient Active Problem List   Diagnosis Date Noted   Atrial fibrillation with RVR ([place]) 01/08/2021   Acute respiratory failure ([place]) 12/24/2020   Obstructive sleep apnea treated with BiPAP 12/24/2020   Polysubstance abuse ([place]) 08/20/2020   Alcohol abuse 08/20/2020   Atrial fibrillation with rapid ventricular response ([place]) 08/19/2020   Obesity hypoventilation syndrome ([place]) 06/12/2020   Healthcare maintenance 06/12/2020   Acute on chronic diastolic heart failure ([place]) 02/24/2020   Diabetic neuropathy ([place]) 11/20/2019   Coagulation disorder ([place]) 11/20/2019   Acute on chronic diastolic CHF (congestive heart failure), NYHA class 3 ([employer]) 07/30/2019   Diabetes mellitus without complication ([place]) 40/98/1191   Hypertensive emergency    BRBPR (bright  red blood per rectum) 06/12/2019   Hypertension    CHF (congestive heart failure) (HCC)    Sleep apnea    Chronic respiratory failure ([place])    Hypokalemia 05/29/2019   GI bleed 05/29/2019   Anemia 05/28/2019   Iron deficiency anemia 03/11/2019   OSA on CPAP 03/11/2019   HLD (hyperlipidemia) 03/11/2019   Elevated troponin 03/11/2019   GERD (gastroesophageal reflux disease) 03/11/2019   Rectal bleeding 02/20/2019   Dyspnea 02/06/2019   COVID-19 virus infection 02/06/2019   Bilateral lower extremity edema    Morbid obesity with BMI of 50.0-59.9, adult (HCC)    PAF (paroxysmal atrial fibrillation) ([place])    PE (pulmonary thromboembolism) ([place]) 01/21/2019   Hypertensive urgency 01/21/2019   Diabetes mellitus type 2 in obese ([place]) 01/21/2019    Current Outpatient Medications:    atorvastatin (LIPITOR) 40 MG tablet, Take 1 tablet (40 mg total) by mouth at bedtime., Disp: 90 tablet, Rfl: 3   Blood Glucose Monitoring Suppl (TRUE METRIX METER) w/Device KIT, Use as instructed. Check blood glucose level by fingerstick twice per day., Disp: 1 kit, Rfl: 0   Blood Pressure Monitor DEVI, Please provide patient with insurance approved blood pressure device with L-XL cuff. BMI 55, Disp: 1 each, Rfl: 0   carboxymethylcellulose (REFRESH PLUS) 0.5 % SOLN, Place 1 drop into both eyes daily., Disp: , Rfl:    diltiazem (CARDIZEM CD) 360 MG 24 hr capsule, Take 1 capsule (360 mg total) by mouth daily., Disp: 90 capsule, Rfl: 3  doxazosin (CARDURA) 2 MG tablet, Take 1 tablet (2 mg total) by mouth daily., Disp: 30 tablet, Rfl: 11   doxycycline (VIBRAMYCIN) 100 MG capsule, Take 1 capsule (100 mg total) by mouth 2 (two) times daily., Disp: 20 capsule, Rfl: 0   ferrous sulfate (FEROSUL) 325 (65 FE) MG tablet, TAKE 1 TABLET (325 MG TOTAL) BY MOUTH DAILY (NOON), Disp: 30 tablet, Rfl: 0   glucose blood (TRUE METRIX BLOOD GLUCOSE TEST) test strip, Use as instructed. Check blood glucose level by fingerstick twice per  day., Disp: 100 each, Rfl: 12   hydrALAZINE (APRESOLINE) 50 MG tablet, Take 1 tablet (50 mg total) by mouth 3 (three) times daily., Disp: 180 tablet, Rfl: 3   losartan (COZAAR) 50 MG tablet, Take 1 tablet (50 mg total) by mouth daily., Disp: 90 tablet, Rfl: 3   magnesium oxide (MAG-OX) 400 (240 Mg) MG tablet, Take 1 tablet (400 mg total) by mouth 2 (two) times daily., Disp: 90 tablet, Rfl: 3   metFORMIN (GLUCOPHAGE) 500 MG tablet, TAKE ONE TABLET BY MOUTH TWICE DAILY WITH A MEAL (NOON+BEDTIME), Disp: 180 tablet, Rfl: 0   Misc. Devices MISC, Requires O2 @ 3L/min continuously via nasal canula and home fill system,., Disp: 1 each, Rfl: 0   Misc. Devices MISC, Please provide Mr. Niblack with insurance approved portable O2 concentrator ICD 10 J96.11 Z99.81, Disp: 1 each, Rfl: 0   Misc. Devices MISC, Please provide patient with insurance approved LARGE right hand splint ICD 10 G56.01, Disp: 1 each, Rfl: 0   Misc. Devices MISC, Please provide patient wit insurance approved diabetic custom molded shoes (QTY 2) and custom molded multi density inserts (1 for each shoe)., Disp: 1 each, Rfl: 0   omeprazole (PRILOSEC) 20 MG capsule, TAKE 1 CAPSULE 2 (TWO) TIMES DAILY BEFORE A MEAL. TAKE FIRST CAPSULE 30 MIN PRIOR TO EATING OR TAKING OTHER MEDICATIONS. (AM+BEDTIME), Disp: 180 capsule, Rfl: 1   potassium chloride (KLOR-CON) 10 MEQ tablet, Take 8 tablets (80 mEq total) by mouth daily., Disp: 240 tablet, Rfl: 11   PROVENTIL HFA 108 (90 Base) MCG/ACT inhaler, INHALE 2 PUFFS BY MOUTH EVERY 6 (SIX) HOURS AS NEEDED FOR WHEEZING OR SHORTNESS OF BREATH., Disp: 18 g, Rfl: 6   rivaroxaban (XARELTO) 20 MG TABS tablet, Take 1 tablet (20 mg total) by mouth daily with supper., Disp: 90 tablet, Rfl: 3   spironolactone (ALDACTONE) 50 MG tablet, Take 1 tablet (50 mg total) by mouth daily., Disp: 30 tablet, Rfl: 11   STOOL SOFTENER 100 MG capsule, TAKE 1 CAPSULE BY MOUTH 2 (TWO) TIMES DAILY (NOON+BEDTIME), Disp: 60 capsule, Rfl: 0    torsemide (DEMADEX) 20 MG tablet, Take 2 tablets (40 mg total) by mouth 2 (two) times daily., Disp: 60 tablet, Rfl: 11   TRUEplus Lancets 28G MISC, Use as instructed. Check blood glucose level by fingerstick twice per day., Disp: 200 each, Rfl: 3 No Known Allergies   Social History   Socioeconomic History   Marital status: Single    Spouse name: Not on file   Number of children: Not on file   Years of education: Not on file   Highest education level: Not on file  Occupational History   Not on file  Tobacco Use   Smoking status: Former   Smokeless tobacco: Never   Tobacco comments:    smoked weed in the past  Vaping Use   Vaping Use: Never used  Substance and Sexual Activity   Alcohol use: Not Currently   Drug  use: Yes    Types: Marijuana, Cocaine   Sexual activity: Not Currently  Other Topics Concern   Not on file  Social History Narrative   Not on file   Social Determinants of Health   Financial Resource Strain: High Risk   Difficulty of Paying Living Expenses: Hard  Food Insecurity: No Food Insecurity   Worried About Running Out of Food in the Last Year: Never true   Ran Out of Food in the Last Year: Never true  Transportation Needs: No Transportation Needs   Lack of Transportation (Medical): No   Lack of Transportation (Non-Medical): No  Physical Activity: Not on file  Stress: Not on file  Social Connections: Not on file  Intimate Partner Violence: Not on file    Physical Exam Vitals reviewed.  Constitutional:      Appearance: He is obese.  HENT:     Head: Normocephalic.     Nose: Nose normal.     Mouth/Throat:     Mouth: Mucous membranes are moist.     Pharynx: Oropharynx is clear.  Eyes:     Conjunctiva/sclera: Conjunctivae normal.     Pupils: Pupils are equal, round, and reactive to light.  Cardiovascular:     Rate and Rhythm: Normal rate and regular rhythm.     Pulses: Normal pulses.     Heart sounds: Normal heart sounds.  Pulmonary:      Effort: Pulmonary effort is normal.  Abdominal:     General: There is distension.  Musculoskeletal:        General: Swelling and tenderness present. Normal range of motion.     Cervical back: Normal range of motion.     Right lower leg: Edema present.     Left lower leg: Edema present.     Comments: Tenderness below the knees with bil. Swelling.   Skin:    General: Skin is warm and dry.     Capillary Refill: Capillary refill takes less than 2 seconds.  Neurological:     General: No focal deficit present.     Mental Status: He is alert. Mental status is at baseline.  Psychiatric:        Mood and Affect: Mood normal.        Future Appointments  Date Time Provider [place]  11/24/2021 12:00 PM [facility] PA/NP [facility] None  01/28/2022  9:15 AM MC ECHO OP 2 [facility]  01/28/2022 10:40 AM Larey Dresser, MD MC-[facility] None     ACTION: Home visit completed

## 2021-11-24 ENCOUNTER — Other Ambulatory Visit: Payer: Self-pay

## 2021-11-24 ENCOUNTER — Ambulatory Visit (HOSPITAL_COMMUNITY)
Admission: RE | Admit: 2021-11-24 | Discharge: 2021-11-24 | Disposition: A | Discharge status: Home / Self Care | Payer: Medicaid Other | Source: Ambulatory Visit | Attending: Family Medicine | Admitting: Family Medicine

## 2021-11-24 ENCOUNTER — Encounter (HOSPITAL_COMMUNITY): Payer: Self-pay

## 2021-11-24 ENCOUNTER — Telehealth (HOSPITAL_COMMUNITY): Payer: Self-pay

## 2021-11-24 ENCOUNTER — Other Ambulatory Visit (HOSPITAL_COMMUNITY): Payer: Self-pay

## 2021-11-24 VITALS — BP 122/78 | HR 104 | Wt 359.4 lb

## 2021-11-24 DIAGNOSIS — F1211 Cannabis abuse, in remission: Secondary | ICD-10-CM | POA: Diagnosis not present

## 2021-11-24 DIAGNOSIS — Z6841 Body Mass Index (BMI) 40.0 and over, adult: Secondary | ICD-10-CM | POA: Insufficient documentation

## 2021-11-24 DIAGNOSIS — Z79899 Other long term (current) drug therapy: Secondary | ICD-10-CM | POA: Diagnosis not present

## 2021-11-24 DIAGNOSIS — E118 Type 2 diabetes mellitus with unspecified complications: Secondary | ICD-10-CM

## 2021-11-24 DIAGNOSIS — I5032 Chronic diastolic (congestive) heart failure: Secondary | ICD-10-CM | POA: Insufficient documentation

## 2021-11-24 DIAGNOSIS — E119 Type 2 diabetes mellitus without complications: Secondary | ICD-10-CM | POA: Diagnosis not present

## 2021-11-24 DIAGNOSIS — I2699 Other pulmonary embolism without acute cor pulmonale: Secondary | ICD-10-CM | POA: Diagnosis not present

## 2021-11-24 DIAGNOSIS — I110 Hypertensive heart disease with heart failure: Secondary | ICD-10-CM | POA: Diagnosis not present

## 2021-11-24 DIAGNOSIS — F1411 Cocaine abuse, in remission: Secondary | ICD-10-CM | POA: Diagnosis not present

## 2021-11-24 DIAGNOSIS — D649 Anemia, unspecified: Secondary | ICD-10-CM | POA: Diagnosis not present

## 2021-11-24 DIAGNOSIS — Z7901 Long term (current) use of anticoagulants: Secondary | ICD-10-CM | POA: Insufficient documentation

## 2021-11-24 DIAGNOSIS — I482 Chronic atrial fibrillation, unspecified: Secondary | ICD-10-CM | POA: Diagnosis not present

## 2021-11-24 DIAGNOSIS — G4733 Obstructive sleep apnea (adult) (pediatric): Secondary | ICD-10-CM | POA: Diagnosis not present

## 2021-11-24 DIAGNOSIS — R778 Other specified abnormalities of plasma proteins: Secondary | ICD-10-CM | POA: Insufficient documentation

## 2021-11-24 DIAGNOSIS — I480 Paroxysmal atrial fibrillation: Secondary | ICD-10-CM | POA: Diagnosis not present

## 2021-11-24 DIAGNOSIS — E662 Morbid (severe) obesity with alveolar hypoventilation: Secondary | ICD-10-CM | POA: Insufficient documentation

## 2021-11-24 DIAGNOSIS — Z8616 Personal history of COVID-19: Secondary | ICD-10-CM | POA: Insufficient documentation

## 2021-11-24 DIAGNOSIS — I10 Essential (primary) hypertension: Secondary | ICD-10-CM

## 2021-11-24 DIAGNOSIS — K219 Gastro-esophageal reflux disease without esophagitis: Secondary | ICD-10-CM | POA: Diagnosis not present

## 2021-11-24 DIAGNOSIS — Z9981 Dependence on supplemental oxygen: Secondary | ICD-10-CM | POA: Insufficient documentation

## 2021-11-24 DIAGNOSIS — M1990 Unspecified osteoarthritis, unspecified site: Secondary | ICD-10-CM | POA: Diagnosis not present

## 2021-11-24 DIAGNOSIS — Z8719 Personal history of other diseases of the digestive system: Secondary | ICD-10-CM | POA: Insufficient documentation

## 2021-11-24 DIAGNOSIS — Z7951 Long term (current) use of inhaled steroids: Secondary | ICD-10-CM | POA: Diagnosis not present

## 2021-11-24 DIAGNOSIS — Z86711 Personal history of pulmonary embolism: Secondary | ICD-10-CM | POA: Diagnosis not present

## 2021-11-24 DIAGNOSIS — J449 Chronic obstructive pulmonary disease, unspecified: Secondary | ICD-10-CM | POA: Diagnosis not present

## 2021-11-24 DIAGNOSIS — F1910 Other psychoactive substance abuse, uncomplicated: Secondary | ICD-10-CM

## 2021-11-24 DIAGNOSIS — M2550 Pain in unspecified joint: Secondary | ICD-10-CM | POA: Insufficient documentation

## 2021-11-24 DIAGNOSIS — Z9989 Dependence on other enabling machines and devices: Secondary | ICD-10-CM

## 2021-11-24 NOTE — Telephone Encounter (Signed)
Attempted to reach Benjamin [name] multiple times today to remind him of his upcoming appointment today at 78 with no success. He was made aware of same on home visit on Monday and given written instructions of this appointment. I will continue to follow up.  ?

## 2021-11-24 NOTE — Progress Notes (Signed)
Paramedicine Encounter ? ? ? Patient ID: Benjamin [name], male    DOB: 08/31/1966, 56 y.o.   MRN: [id number] ? ?Saw Talyn in clinic today. He was seen by Shanda Bumps NP.  ? ?She ordered Torsemide to continue 80mg  BID for 2 days and then back to 40mg  BID. I made sure pill box reflected same. He will need follow up with [facility] next Monday. 3/12. I will communicate this with her.  ? ?Visit complete.  ? ? ? ?ACTION: ?Home visit completed ? ? ? ? ? ? ?

## 2021-11-24 NOTE — Progress Notes (Signed)
Advanced Heart Failure Clinic Note   PCP: [name], NP PCP-Cardiologist: Dorris Carnes, MD  [facility]: Dr. Aundra Dubin   HPI: [name] is a 56 yo with hx of diastolic CHF, HTN, DM, Atrial fib, PE in April 2020 (on Xarelto), OSA, GERD, chronic elevation of troponin.    Admitted 01/21/2019 with increased dyspnea and had PE. He was anticoagulated.    Patient re-admitted 5/20 for syncope/cough/dyspnea, COVID+, CT positive for bilateral GGOs, admitted to [facility] and discharged after 6 days, on O2.  Rec'd Cards f/u.   Patient re-admitted 02/20/19 for bright red blood per rectum, eval'ed by GI, had EGD/colon, dx'd with hemorrhoidal bleeding, discharged after 9 days on O2. He was again Covid + .   Readmitted 03/11/19 with increased shortness of breath. He did not have home oxygen. He was discharged to home the next day.    He presented to [facility] ED 04/11/19 with lower extremity edema. COVID negative. Diuresed with lasix drip and transitioned to torsemide 40 mg twice daily. Had [place] with mild volume overload and preserved cardiac output. Discharge weight was 407.9 pounds.    He was readmitted with symptomatic anemia 05/28/19 with hgb 7.1. Received 1UPRBCs. GI consulted. He started on anusol and continued on stool softner.    He was readmitted to the hospital on 07/16/19 when he presented to the ED w/ worsening symptoms and was admitted by IM for a/c diastolic CHF and treated w/ IV Lasix. Had 2 day hospital stay and was discharged on 10/29. Echo was done 10/28 and showed normal LVEF 65-70%, RV interpreted as normal but suspect significant RV dysfunction.  After diuresis w/ IV Lasix, he was placed back on home diuretic regimen, torsemide 100 mg bid. Lisinopril 5 mg was also added to regimen. AHF team was not consulted that admit.   He was seen back in clinic 07/30/19. Felt poorly. SOB at rest and worse w/ exertion. Also w/ orthopnea/PND. Significant wt gain of 20 lb up to 428 lb (dry wt ~407 lb), w/ marked abdominal  distention and LEE and poor response to IV diuretics.  Once diuresed transitioned to torsemide 100 mg twice a day. D/C 408 pounds.   Zio patch on 10/20/19-No arrhythmia.   Re-admitted 08/19/20-08/24/20 for  A fib RVR. A. fib RVR in the setting of noncompliance ethanol and cocaine. Cardizem started and labetalol continued.  12/3 TEE (EF 60-65%) with DCCV successfully converted to SR. Diuresed with IV lasix and had AKI in setting of over diuresis. SCr 1.59 on admit, bumped to 2.5. Diuretics held + IVF hydration.   Admitted 12/24/20 with hypertensive emergency and respiratory failure. .   Admitted 422/22 with A Fib RVR & AMS. Diltiazem increased to 360 mg daily. Chemically converted to NSR.  + Cocaine.   Seen in ED 11/22 for COPD exacerbation.  DC'd from paramedicine due to lack of communication.  Follow up 1/23 he had not been seen since 5/22. He appeared volume overloaded and torsemide increased.   Seen 11/02/21 and had been off all meds x 1 week, volume overloaded. Meds restarted and instructed to take metolazone 2.5 x 2 days w/ extra 40 kcl, however labs showed elevated BUN/SCr and left message to take metolazone x 1 day.  Re-enrolled in paramedicine.  Today he returns for HF follow up with paramedicine. He missed 1 week of meds. I was notified of this by paramedicine and I advised to increase lasix to 80 mg bid until follow up today. He went to the gym today  and walked on the TM without dyspnea. Continues with generalized pain but says "I feel like a million bucks." Left oxygen at home but otherwise wears 3L oxygen continuously. Denies abnormal bleeding, CP, dizziness, edema, or PND/Orthopnea. Appetite ok. No fever or chills. Weight down 7 lbs. Paramedicine helping with meds. Unable to tell me last time he used cocaine, says it triggers him to think of it. Continues substance abuse meetings 2x/week.  REDs: unable to obtain reading  ECG (personally reviewed): none ordered today.  Labs (11/20): BNP  32, K 3.6, creatinine 1.09  Labs (12/20): K 3.3, creatinine 1.28 Labs (10/10/19): K 3.9 Creatinine 1.4  Labs (11/05/19) : K 3.2 Creatinine 1.47  Labs (12/30/2019) : K 3.5 Creatinine 1.14  Labs (03/02/2020): K 3.8 Creatinine 1.09.  Labs (12/21): K 4.0, Creatinine 1.17, Magnesium 1.6 Labs (09/15/20): K 3.1, Creatinine 2.54 Labs (1/22): K 3.1, creatinine 1.56 Labs (5/22): K 3.6, creatinine 1.89 Labs (1/23): K 3.9, creatinine 1.34 Labs (3/23): K 4.0, creatinine 1.29  Review of Systems: All systems reviewed and negative except as per HPI.   PMH: 1. Atrial fibrillation: Paroxysmal.  - DCCV (08/21/20): converted to SR 2. Pulmonary embolus: 5/20.  3. OHS/OSA: He is on home oxygen during the day and uses CPAP at night.  4. Morbid obesity.  5. Chronic diastolic CHF:  - RHC (4/88): mean RA 12, PA 40/25 mean 31, mean PCWP 23, CI 2.47, PVR 1.1 WU.  - Echo (10/20): EF 65-70%, mild LVH, normal RV size and systolic function.  - TEE (12/21): EF 60-65% 6. Type 2 diabetes 7. HTN 8. Rectal bleeding: ?Hemorrhoidal.  9. COVID-19 infection 7/20.  10. ZIo Patch - 10/20/19 no arrhythmias  NST 08/23/20 There was no ST segment deviation noted during stress. The left ventricular ejection fraction is moderately decreased (30-44%). Nuclear stress EF: 43%. This is a low risk study. The study is normal.   Fixed apical inferior perfusion defect with normal wall motion in this area, consistent with artifact  Current Outpatient Medications  Medication Sig Dispense Refill   atorvastatin (LIPITOR) 40 MG tablet Take 1 tablet (40 mg total) by mouth at bedtime. 90 tablet 3   Blood Glucose Monitoring Suppl (TRUE METRIX METER) w/Device KIT Use as instructed. Check blood glucose level by fingerstick twice per day. 1 kit 0   Blood Pressure Monitor DEVI Please provide patient with insurance approved blood pressure device with L-XL cuff. BMI 55 1 each 0   carboxymethylcellulose (REFRESH PLUS) 0.5 % SOLN Place 1 drop into  both eyes daily.     diltiazem (CARDIZEM CD) 360 MG 24 hr capsule Take 1 capsule (360 mg total) by mouth daily. 90 capsule 3   doxazosin (CARDURA) 2 MG tablet Take 1 tablet (2 mg total) by mouth daily. 30 tablet 11   doxycycline (VIBRAMYCIN) 100 MG capsule Take 1 capsule (100 mg total) by mouth 2 (two) times daily. 20 capsule 0   ferrous sulfate (FEROSUL) 325 (65 FE) MG tablet TAKE 1 TABLET (325 MG TOTAL) BY MOUTH DAILY (NOON) 30 tablet 0   glucose blood (TRUE METRIX BLOOD GLUCOSE TEST) test strip Use as instructed. Check blood glucose level by fingerstick twice per day. 100 each 12   hydrALAZINE (APRESOLINE) 50 MG tablet Take 1 tablet (50 mg total) by mouth 3 (three) times daily. 180 tablet 3   losartan (COZAAR) 50 MG tablet Take 1 tablet (50 mg total) by mouth daily. 90 tablet 3   magnesium oxide (MAG-OX) 400 (240 Mg) MG tablet  Take 1 tablet (400 mg total) by mouth 2 (two) times daily. 90 tablet 3   metFORMIN (GLUCOPHAGE) 500 MG tablet TAKE ONE TABLET BY MOUTH TWICE DAILY WITH A MEAL (NOON+BEDTIME) 180 tablet 0   Misc. Devices MISC Requires O2 @ 3L/min continuously via nasal canula and home fill system,. 1 each 0   Misc. Devices MISC Please provide Benjamin [name] with insurance approved portable O2 concentrator ICD 10 J96.11 Z99.81 1 each 0   Misc. Devices MISC Please provide patient with insurance approved LARGE right hand splint ICD 10 G56.01 1 each 0   Misc. Devices MISC Please provide patient wit insurance approved diabetic custom molded shoes (QTY 2) and custom molded multi density inserts (1 for each shoe). 1 each 0   omeprazole (PRILOSEC) 20 MG capsule TAKE 1 CAPSULE 2 (TWO) TIMES DAILY BEFORE A MEAL. TAKE FIRST CAPSULE 30 MIN PRIOR TO EATING OR TAKING OTHER MEDICATIONS. (AM+BEDTIME) 180 capsule 1   potassium chloride (KLOR-CON) 10 MEQ tablet Take 8 tablets (80 mEq total) by mouth daily. 240 tablet 11   PROVENTIL HFA 108 (90 Base) MCG/ACT inhaler INHALE 2 PUFFS BY MOUTH EVERY 6 (SIX) HOURS AS  NEEDED FOR WHEEZING OR SHORTNESS OF BREATH. 18 g 6   rivaroxaban (XARELTO) 20 MG TABS tablet Take 1 tablet (20 mg total) by mouth daily with supper. 90 tablet 3   spironolactone (ALDACTONE) 50 MG tablet Take 1 tablet (50 mg total) by mouth daily. 30 tablet 11   STOOL SOFTENER 100 MG capsule TAKE 1 CAPSULE BY MOUTH 2 (TWO) TIMES DAILY (NOON+BEDTIME) 60 capsule 0   torsemide (DEMADEX) 20 MG tablet Take 2 tablets (40 mg total) by mouth 2 (two) times daily. 60 tablet 11   TRUEplus Lancets 28G MISC Use as instructed. Check blood glucose level by fingerstick twice per day. 200 each 3   No current facility-administered medications for this encounter.   No Known Allergies  Social History   Socioeconomic History   Marital status: Single    Spouse name: Not on file   Number of children: Not on file   Years of education: Not on file   Highest education level: Not on file  Occupational History   Not on file  Tobacco Use   Smoking status: Former   Smokeless tobacco: Never   Tobacco comments:    smoked weed in the past  Vaping Use   Vaping Use: Never used  Substance and Sexual Activity   Alcohol use: Not Currently   Drug use: Yes    Types: Marijuana, Cocaine   Sexual activity: Not Currently  Other Topics Concern   Not on file  Social History Narrative   Not on file   Social Determinants of Health   Financial Resource Strain: High Risk   Difficulty of Paying Living Expenses: Hard  Food Insecurity: No Food Insecurity   Worried About Running Out of Food in the Last Year: Never true   Ran Out of Food in the Last Year: Never true  Transportation Needs: No Transportation Needs   Lack of Transportation (Medical): No   Lack of Transportation (Non-Medical): No  Physical Activity: Not on file  Stress: Not on file  Social Connections: Not on file  Intimate Partner Violence: Not on file   Family History  Problem Relation Age of Onset   Hypertension Mother    Diabetes Mother    BP  122/78    Pulse (!) 104    Wt (!) 163 kg (359 lb 6.4  oz)    SpO2 93%    BMI 50.13 kg/m   Wt Readings from Last 3 Encounters:  11/24/21 (!) 163 kg (359 lb 6.4 oz)  11/22/21 (!) 167.8 kg (370 lb)  11/11/21 (!) 160.6 kg (354 lb)   PHYSICAL EXAM: General:  NAD. No resp difficulty HEENT: Normal Neck: Supple. Thick neck. Carotids 2+ bilat; no bruits. No lymphadenopathy or thryomegaly appreciated. Cor: PMI nondisplaced. Regular rate & rhythm. No rubs, gallops or murmurs. Lungs: Clear Abdomen: Obese, nontender, nondistended. No hepatosplenomegaly. No bruits or masses. Good bowel sounds. Extremities: No cyanosis, clubbing, rash, trace BLE edema Neuro: Alert & oriented x 3, cranial nerves grossly intact. Moves all 4 extremities w/o difficulty. Affect pleasant.  ASSESSMENT & PLAN: 1. Chronic diastolic CHF: Echo in 11/17 with EF 65-70%, RV interpreted as normal but suspect significant RV dysfunction. Improved NYHA II, functional status difficult due to generalized arthritic pain and body habitus, but he looks much better today. He does not appear volume overloaded on exam, weight down 5 lbs. - Continue torsemide 80 mg bid x 2 more days then back to 40 mg bid. K 4.0 and SCr 1.29 stable on labs this week. - PA for sQ Lasix for future use has been initiated.  - Continue spironolactone 50 mg daily.  - Continue hydralazine 50 mg tid. - Continue losartan 50 mg daily. - Continue to weigh daily and watch salt/fluid intake. - Repeat echo next visit. 2. Atrial fibrillation: Paroxysmal.  Regular on exam today. - Continue current dose of diltiazem.  - Continue Xarelto. No bleeding issues. 3. H/o PE: 5/20, continue Xarelto.  4. OHS/OSA: Now on Trilegy. - Continue home O2. 5. HTN: Well controlled. Meds as above. 6. DM: Per PCP. 7. Obesity: Body mass index is 50.13 kg/m.  - Continue weight loss efforts. 8. Substance abuse: Continue substance abuse meetings. 9. SDOH: Appreciate paramedicine assistance, I  think this helps him tremendously. - He has a car and a place to live. - He has a scale and a PCP. - Meds thru [place] in bubble packs.  Follow up in 2 months with Dr. Aundra Dubin + echo as scheduled.  [place], FNP 11/24/2021 1:59 PM

## 2021-11-24 NOTE — Patient Instructions (Signed)
Take Torsemide 80 mg Twice daily for another 2 days, then go back to 40 mg Twice daily ? ? ?Keep follow up with Dr. Shirlee Latch ? ?If you have any questions or concerns before your next appointment please send [place] a message through [place] or call our office at [phone].   ? ?TO LEAVE A MESSAGE FOR THE NURSE SELECT OPTION 2, PLEASE LEAVE A MESSAGE INCLUDING: ?YOUR NAME ?DATE OF BIRTH ?CALL BACK NUMBER ?REASON FOR CALL**this is important as we prioritize the call backs ? ?YOU WILL RECEIVE A CALL BACK THE SAME DAY AS LONG AS YOU CALL BEFORE 4:00 PM ? ?At the [facility], you and your health needs are our priority. As part of our continuing mission to provide you with exceptional heart care, we have created designated Provider Care Teams. These Care Teams include your primary Cardiologist (physician) and Advanced Practice Providers (APPs- Physician Assistants and Nurse Practitioners) who all work together to provide you with the care you need, when you need it.  ? ?You may see any of the following providers on your designated Care Team at your next follow up: ?Dr Arvilla Meres ?Dr Marca Ancona ?Tonye Becket, NP ?Robbie Lis, PA ?Jessica Milford,NP ?Anna Genre, PA ?Karle Plumber, PharmD ? ? ?Please be sure to bring in all your medications bottles to every appointment.  ? ? ?

## 2021-11-29 ENCOUNTER — Telehealth (HOSPITAL_COMMUNITY): Payer: Self-pay | Admitting: *Deleted

## 2021-11-29 ENCOUNTER — Other Ambulatory Visit (HOSPITAL_COMMUNITY): Payer: Self-pay

## 2021-11-29 MED ORDER — METOLAZONE 2.5 MG PO TABS: ORAL_TABLET | ORAL | 0 refills | Status: DC

## 2021-11-29 NOTE — Progress Notes (Signed)
Paramedicine Encounter ? ? ? Patient ID: Benjamin [name], male    DOB: 1966/04/19, 56 y.o.   MRN: [id number] ? ?Pt reports he is swollen up everywhere, his legs feel tight up to his knees. Pt reports testicles are swollen too. Increased sob.  ?He has been able to urinate for much for several days now, when the torsemide was increased he said he had good output, but then when the dose changed back down he stopped.  ? ?He said his PO comes back out Wednesday for another urine test.  ?He reports his poop is watery, he is coughing up phelgm, he reports it is dark colored at times.  ?He reports last drug use was 2 wks ago.  ?Pill box refilled including his potassium.  ?I contacted clinic ref pts weight increase, swelling and increased sob.-per their directions for his torsemide-take metolazone 2.60m X 2 days with extra 44m potassium.  ?Will get that p/u tomor and take to him tomor.  ?Labs in 1 week.  ? ? ? ?BP (!) 126/0   Pulse 98   Resp 20   Wt (!) 370 lb (167.8 kg)   SpO2 96%   BMI 51.60 kg/m?  ?Weight yesterday-370 ?Last visit weight-359 @ clinic  ? ?Patient Care Team: ?FlGildardo PoundsNP as PCP - General (Nurse Practitioner) ?RoFay RecordsMD as PCP - Cardiology (Cardiology) ?UrJorge NyLCSW as SoEducation officer, museumLicensed ClHoliday representative? ?Patient Active Problem List  ? Diagnosis Date Noted  ? Atrial fibrillation with RVR (HCLeola04/22/2022  ? Acute respiratory failure (HCDISH04/03/2021  ? Obstructive sleep apnea treated with BiPAP 12/24/2020  ? Polysubstance abuse (HCRichmond12/10/2019  ? Alcohol abuse 08/20/2020  ? Atrial fibrillation with rapid ventricular response (HCMadison12/09/2019  ? Obesity hypoventilation syndrome (HCMoscow09/24/2021  ? Healthcare maintenance 06/12/2020  ? Acute on chronic diastolic heart failure (HCRoosevelt06/03/2020  ? Diabetic neuropathy (HCMontezuma03/11/2019  ? Coagulation disorder (HCHurst03/11/2019  ? Acute on chronic diastolic CHF (congestive heart failure), NYHA class 3 (HCHerrick11/06/2019  ?  Diabetes mellitus without complication (HCSt. Paul1059/16/3846? Hypertensive emergency   ? BRBPR (bright red blood per rectum) 06/12/2019  ? Hypertension   ? CHF (congestive heart failure) (HCBearcreek  ? Sleep apnea   ? Chronic respiratory failure (HCWaller  ? Hypokalemia 05/29/2019  ? GI bleed 05/29/2019  ? Anemia 05/28/2019  ? Iron deficiency anemia 03/11/2019  ? OSA on CPAP 03/11/2019  ? HLD (hyperlipidemia) 03/11/2019  ? Elevated troponin 03/11/2019  ? GERD (gastroesophageal reflux disease) 03/11/2019  ? Rectal bleeding 02/20/2019  ? Dyspnea 02/06/2019  ? COVID-19 virus infection 02/06/2019  ? Bilateral lower extremity edema   ? Morbid obesity with BMI of 50.0-59.9, adult (HCSt. Louis Park  ? PAF (paroxysmal atrial fibrillation) (HCCutten  ? PE (pulmonary thromboembolism) (HCWinona05/12/2018  ? Hypertensive urgency 01/21/2019  ? Diabetes mellitus type 2 in obese (HCTunica Resorts05/12/2018  ? ? ?Current Outpatient Medications:  ?  metolazone (ZAROXOLYN) 2.5 MG tablet, Take only as directed by CHF clinic., Disp: 10 tablet, Rfl: 0 ?  atorvastatin (LIPITOR) 40 MG tablet, Take 1 tablet (40 mg total) by mouth at bedtime., Disp: 90 tablet, Rfl: 3 ?  Blood Glucose Monitoring Suppl (TRUE METRIX METER) w/Device KIT, Use as instructed. Check blood glucose level by fingerstick twice per day., Disp: 1 kit, Rfl: 0 ?  Blood Pressure Monitor DEVI, Please provide patient with insurance approved blood pressure device with L-XL cuff. BMI 55,  Disp: 1 each, Rfl: 0 ?  carboxymethylcellulose (REFRESH PLUS) 0.5 % SOLN, Place 1 drop into both eyes daily., Disp: , Rfl:  ?  diltiazem (CARDIZEM CD) 360 MG 24 hr capsule, Take 1 capsule (360 mg total) by mouth daily., Disp: 90 capsule, Rfl: 3 ?  doxazosin (CARDURA) 2 MG tablet, Take 1 tablet (2 mg total) by mouth daily., Disp: 30 tablet, Rfl: 11 ?  doxycycline (VIBRAMYCIN) 100 MG capsule, Take 1 capsule (100 mg total) by mouth 2 (two) times daily., Disp: 20 capsule, Rfl: 0 ?  ferrous sulfate (FEROSUL) 325 (65 FE) MG tablet, TAKE 1  TABLET (325 MG TOTAL) BY MOUTH DAILY (NOON), Disp: 30 tablet, Rfl: 0 ?  glucose blood (TRUE METRIX BLOOD GLUCOSE TEST) test strip, Use as instructed. Check blood glucose level by fingerstick twice per day., Disp: 100 each, Rfl: 12 ?  hydrALAZINE (APRESOLINE) 50 MG tablet, Take 1 tablet (50 mg total) by mouth 3 (three) times daily., Disp: 180 tablet, Rfl: 3 ?  losartan (COZAAR) 50 MG tablet, Take 1 tablet (50 mg total) by mouth daily., Disp: 90 tablet, Rfl: 3 ?  magnesium oxide (MAG-OX) 400 (240 Mg) MG tablet, Take 1 tablet (400 mg total) by mouth 2 (two) times daily., Disp: 90 tablet, Rfl: 3 ?  metFORMIN (GLUCOPHAGE) 500 MG tablet, TAKE ONE TABLET BY MOUTH TWICE DAILY WITH A MEAL (NOON+BEDTIME), Disp: 180 tablet, Rfl: 0 ?  Misc. Devices MISC, Requires O2 @ 3L/min continuously via nasal canula and home fill system,., Disp: 1 each, Rfl: 0 ?  Misc. Devices MISC, Please provide Mr. Tierno with insurance approved portable O2 concentrator ICD 10 J96.11 Z99.81, Disp: 1 each, Rfl: 0 ?  Misc. Devices MISC, Please provide patient with insurance approved LARGE right hand splint ICD 10 G56.01, Disp: 1 each, Rfl: 0 ?  Misc. Devices MISC, Please provide patient wit insurance approved diabetic custom molded shoes (QTY 2) and custom molded multi density inserts (1 for each shoe)., Disp: 1 each, Rfl: 0 ?  omeprazole (PRILOSEC) 20 MG capsule, TAKE 1 CAPSULE 2 (TWO) TIMES DAILY BEFORE A MEAL. TAKE FIRST CAPSULE 30 MIN PRIOR TO EATING OR TAKING OTHER MEDICATIONS. (AM+BEDTIME), Disp: 180 capsule, Rfl: 1 ?  potassium chloride (KLOR-CON) 10 MEQ tablet, Take 8 tablets (80 mEq total) by mouth daily., Disp: 240 tablet, Rfl: 11 ?  PROVENTIL HFA 108 (90 Base) MCG/ACT inhaler, INHALE 2 PUFFS BY MOUTH EVERY 6 (SIX) HOURS AS NEEDED FOR WHEEZING OR SHORTNESS OF BREATH., Disp: 18 g, Rfl: 6 ?  rivaroxaban (XARELTO) 20 MG TABS tablet, Take 1 tablet (20 mg total) by mouth daily with supper., Disp: 90 tablet, Rfl: 3 ?  spironolactone (ALDACTONE) 50  MG tablet, Take 1 tablet (50 mg total) by mouth daily., Disp: 30 tablet, Rfl: 11 ?  STOOL SOFTENER 100 MG capsule, TAKE 1 CAPSULE BY MOUTH 2 (TWO) TIMES DAILY (NOON+BEDTIME), Disp: 60 capsule, Rfl: 0 ?  torsemide (DEMADEX) 20 MG tablet, Take 2 tablets (40 mg total) by mouth 2 (two) times daily., Disp: 60 tablet, Rfl: 11 ?  TRUEplus Lancets 28G MISC, Use as instructed. Check blood glucose level by fingerstick twice per day., Disp: 200 each, Rfl: 3 ?No Known Allergies ? ? ? ?Social History  ? ?Socioeconomic History  ? Marital status: Single  ?  Spouse name: Not on file  ? Number of children: Not on file  ? Years of education: Not on file  ? Highest education level: Not on file  ?Occupational History  ?  Not on file  ?Tobacco Use  ? Smoking status: Former  ? Smokeless tobacco: Never  ? Tobacco comments:  ?  smoked weed in the past  ?Vaping Use  ? Vaping Use: Never used  ?Substance and Sexual Activity  ? Alcohol use: Not Currently  ? Drug use: Yes  ?  Types: Marijuana, Cocaine  ? Sexual activity: Not Currently  ?Other Topics Concern  ? Not on file  ?Social History Narrative  ? Not on file  ? ?Social Determinants of Health  ? ?Financial Resource Strain: High Risk  ? Difficulty of Paying Living Expenses: Hard  ?Food Insecurity: No Food Insecurity  ? Worried About Charity fundraiser in the Last Year: Never true  ? Ran Out of Food in the Last Year: Never true  ?Transportation Needs: No Transportation Needs  ? Lack of Transportation (Medical): No  ? Lack of Transportation (Non-Medical): No  ?Physical Activity: Not on file  ?Stress: Not on file  ?Social Connections: Not on file  ?Intimate Partner Violence: Not on file  ? ? ?Physical Exam ? ? ? ? ? ?Future Appointments  ?Date Time Provider [place]  ?01/28/2022  9:15 AM [facility] ECHO OP 2 [facility]  ?01/28/2022 10:40 AM Larey Dresser, MD [facility] None  ? ? ? ? ? ?[name], [place] ?[phone] ?[facility] Paramedic  ?11/29/21 ?

## 2021-11-29 NOTE — Telephone Encounter (Signed)
Benjamin [name] with paramedicine called to report pt is SOB, has swelling in both legs, and his weight is 370lbs. Per Benjamin Bumps [place],FNP take metolazone 2.5mg  with of K for 2 days and check labs (bmet) in 1 week. Benjamin [name] aware. ?

## 2021-11-30 ENCOUNTER — Telehealth (HOSPITAL_COMMUNITY): Payer: Self-pay

## 2021-11-30 DIAGNOSIS — I5032 Chronic diastolic (congestive) heart failure: Secondary | ICD-10-CM | POA: Diagnosis not present

## 2021-11-30 DIAGNOSIS — J9611 Chronic respiratory failure with hypoxia: Secondary | ICD-10-CM | POA: Diagnosis not present

## 2021-12-01 ENCOUNTER — Other Ambulatory Visit (HOSPITAL_COMMUNITY): Payer: Self-pay

## 2021-12-01 NOTE — Progress Notes (Signed)
Pt did not get his metolazone yesterday. I went by to p/u for him and take to him.  ?His PO was there as well waiting for him for another urine test.  ?He has not been taking his meds since I seen him the other day.  ?He has increased sob, increased swelling.  ?Eating very high sodium foods.  ? ?He took metolazone and his other meds during visit.  ?I placed his other metolazone and extra potassium in tomors dose of meds.  ? ?Will call to f/u tomor to see how he is feeling.  ? ?Kerry Hough, EMT-Paramedic  ?12/01/21 ? ?

## 2021-12-01 NOTE — Telephone Encounter (Signed)
I contacted pt to see if he could go by pharmacy to get his metolazone, he said he would go right now and p/u.  ? ?Will f/u tomor.  ? ?Kerry Hough, EMT-Paramedic ?11/30/2021 ? ?

## 2021-12-02 ENCOUNTER — Telehealth (HOSPITAL_COMMUNITY): Payer: Self-pay

## 2021-12-02 NOTE — Telephone Encounter (Signed)
Contacted pt to check in to see how he is feeling, he reports his weight is down 5lbs and he has good urine output. His 2nd dose of metolazone is today and he has taken that, still good urine output.  ?Feeling much better, sob has improved. Swelling is going down.  ? ?Kerry Hough, EMT-Paramedic ?12/02/21 ? ?

## 2021-12-06 ENCOUNTER — Telehealth (HOSPITAL_COMMUNITY): Payer: Self-pay | Admitting: Licensed Clinical Social Worker

## 2021-12-06 ENCOUNTER — Other Ambulatory Visit (HOSPITAL_COMMUNITY): Payer: Self-pay

## 2021-12-06 NOTE — Progress Notes (Signed)
Paramedicine Encounter ? ? ? Patient ID: Benjamin [name], male    DOB: February 17, 1966, 56 y.o.   MRN: [id number] ? ? ?Arrived for home visit for Keen who reports feeling okay but having some right ankle pain following a trip today over a small dog. He has no deformity noted and can bear weight, he has full ROM with no swelling noted. He is ambulating with a limp but no trouble. Vitals and assessment as noted. He reports urinating well and having no shortness of breath. He uses his CPAP machine routinely and has been compliant with medications over the last week. I reviewed meds with [name] and bubble packs will be ready tomorrow at 11. Soren agreed to pick these up. I gave him todays doses of meds.  ? ?He reports some left eye pain, redness and itching. Redness noted. No injury noted. I scheduled him an eye appointment at [facility] on [address] on April 5th at 12.  ? ?We reviewed all appointments. I will send over diabetic shoe paper to PCP.  ? ?Home visit complete.  ? ? ?Patient Care Team: ?[name], NP as PCP - General (Nurse Practitioner) ?[name], MD as PCP - Cardiology (Cardiology) ?[name], LCSW as Education officer, museum (Licensed [employer] representative) ? ?Patient Active Problem List  ? Diagnosis Date Noted  ? Atrial fibrillation with RVR ([place]) 01/08/2021  ? Acute respiratory failure ([place]) 12/24/2020  ? Obstructive sleep apnea treated with BiPAP 12/24/2020  ? Polysubstance abuse ([place]) 08/20/2020  ? Alcohol abuse 08/20/2020  ? Atrial fibrillation with rapid ventricular response ([place]) 08/19/2020  ? Obesity hypoventilation syndrome ([place]) 06/12/2020  ? Healthcare maintenance 06/12/2020  ? Acute on chronic diastolic heart failure ([place]) 02/24/2020  ? Diabetic neuropathy ([place]) 11/20/2019  ? Coagulation disorder ([place]) 11/20/2019  ? Acute on chronic diastolic CHF (congestive heart failure), NYHA class 3 ([place]) 07/30/2019  ? Diabetes mellitus without complication ([place]) 50/53/9767  ?  Hypertensive emergency   ? BRBPR (bright red blood per rectum) 06/12/2019  ? Hypertension   ? CHF (congestive heart failure) ([place])   ? Sleep apnea   ? Chronic respiratory failure ([place])   ? Hypokalemia 05/29/2019  ? GI bleed 05/29/2019  ? Anemia 05/28/2019  ? Iron deficiency anemia 03/11/2019  ? OSA on CPAP 03/11/2019  ? HLD (hyperlipidemia) 03/11/2019  ? Elevated troponin 03/11/2019  ? GERD (gastroesophageal reflux disease) 03/11/2019  ? Rectal bleeding 02/20/2019  ? Dyspnea 02/06/2019  ? COVID-19 virus infection 02/06/2019  ? Bilateral lower extremity edema   ? Morbid obesity with BMI of 50.0-59.9, adult ([place])   ? PAF (paroxysmal atrial fibrillation) ([place])   ? PE (pulmonary thromboembolism) ([place]) 01/21/2019  ? Hypertensive urgency 01/21/2019  ? Diabetes mellitus type 2 in obese ([place]) 01/21/2019  ? ? ?Current Outpatient Medications:  ?  metolazone (ZAROXOLYN) 2.5 MG tablet, Take only as directed by CHF clinic., Disp: 10 tablet, Rfl: 0 ?  atorvastatin (LIPITOR) 40 MG tablet, Take 1 tablet (40 mg total) by mouth at bedtime., Disp: 90 tablet, Rfl: 3 ?  Blood Glucose Monitoring Suppl (TRUE METRIX METER) w/Device KIT, Use as instructed. Check blood glucose level by fingerstick twice per day., Disp: 1 kit, Rfl: 0 ?  Blood Pressure Monitor DEVI, Please provide patient with insurance approved blood pressure device with L-XL cuff. BMI 55, Disp: 1 each, Rfl: 0 ?  carboxymethylcellulose (REFRESH PLUS) 0.5 % SOLN, Place 1 drop into both eyes daily., Disp: , Rfl:  ?  diltiazem (CARDIZEM CD) 360 MG 24 hr capsule, Take 1 capsule (360 mg total) by mouth daily., Disp: 90 capsule, Rfl: 3 ?  doxazosin (CARDURA) 2 MG tablet, Take 1 tablet (2 mg total) by mouth daily., Disp: 30 tablet, Rfl: 11 ?  doxycycline (VIBRAMYCIN) 100 MG capsule, Take 1 capsule (100 mg total) by mouth 2 (two) times daily., Disp: 20 capsule, Rfl: 0 ?  ferrous sulfate (FEROSUL) 325 (65 FE) MG tablet, TAKE 1 TABLET (325 MG TOTAL) BY MOUTH DAILY (NOON), Disp: 30  tablet, Rfl: 0 ?  glucose blood (TRUE METRIX BLOOD GLUCOSE TEST) test strip, Use as instructed. Check blood glucose level by fingerstick twice per day., Disp: 100 each, Rfl: 12 ?  hydrALAZINE (APRESOLINE) 50 MG tablet, Take 1 tablet (50 mg total) by mouth 3 (three) times daily., Disp: 180 tablet, Rfl: 3 ?  losartan (COZAAR) 50 MG tablet, Take 1 tablet (50 mg total) by mouth daily., Disp: 90 tablet, Rfl: 3 ?  magnesium oxide (MAG-OX) 400 (240 Mg) MG tablet, Take 1 tablet (400 mg total) by mouth 2 (two) times daily., Disp: 90 tablet, Rfl: 3 ?  metFORMIN (GLUCOPHAGE) 500 MG tablet, TAKE ONE TABLET BY MOUTH TWICE DAILY WITH A MEAL (NOON+BEDTIME), Disp: 180 tablet, Rfl: 0 ?  Misc. Devices MISC, Requires O2 @ 3L/min continuously via nasal canula and home fill system,., Disp: 1 each, Rfl: 0 ?  Misc. Devices MISC, Please provide Mr. Vereen with insurance approved portable O2 concentrator ICD 10 J96.11 Z99.81, Disp: 1 each, Rfl: 0 ?  Misc. Devices MISC, Please provide patient with insurance approved LARGE right hand splint ICD 10 G56.01, Disp: 1 each, Rfl: 0 ?  Misc. Devices MISC, Please provide patient wit insurance approved diabetic custom molded shoes (QTY 2) and custom molded multi density inserts (1 for each shoe)., Disp: 1 each, Rfl: 0 ?  omeprazole (PRILOSEC) 20 MG capsule, TAKE 1 CAPSULE 2 (TWO) TIMES DAILY BEFORE A MEAL. TAKE FIRST CAPSULE 30 MIN PRIOR TO EATING OR TAKING OTHER MEDICATIONS. (AM+BEDTIME), Disp: 180 capsule, Rfl: 1 ?  potassium chloride (KLOR-CON) 10 MEQ tablet, Take 8 tablets (80 mEq total) by mouth daily., Disp: 240 tablet, Rfl: 11 ?  PROVENTIL HFA 108 (90 Base) MCG/ACT inhaler, INHALE 2 PUFFS BY MOUTH EVERY 6 (SIX) HOURS AS NEEDED FOR WHEEZING OR SHORTNESS OF BREATH., Disp: 18 g, Rfl: 6 ?  rivaroxaban (XARELTO) 20 MG TABS tablet, Take 1 tablet (20 mg total) by mouth daily with supper., Disp: 90 tablet, Rfl: 3 ?  spironolactone (ALDACTONE) 50 MG tablet, Take 1 tablet (50 mg total) by mouth daily.,  Disp: 30 tablet, Rfl: 11 ?  STOOL SOFTENER 100 MG capsule, TAKE 1 CAPSULE BY MOUTH 2 (TWO) TIMES DAILY (NOON+BEDTIME), Disp: 60 capsule, Rfl: 0 ?  torsemide (DEMADEX) 20 MG tablet, Take 2 tablets (40 mg total) by mouth 2 (two) times daily., Disp: 60 tablet, Rfl: 11 ?  TRUEplus Lancets 28G MISC, Use as instructed. Check blood glucose level by fingerstick twice per day., Disp: 200 each, Rfl: 3 ?No Known Allergies ? ? ?Social History  ? ?Socioeconomic History  ? Marital status: Single  ?  Spouse name: Not on file  ? Number of children: Not on file  ? Years of education: Not on file  ? Highest education level: Not on file  ?Occupational History  ? Not on file  ?Tobacco Use  ? Smoking status: Former  ? Smokeless tobacco: Never  ? Tobacco comments:  ?  smoked weed in the  past  ?Vaping Use  ? Vaping Use: Never used  ?Substance and Sexual Activity  ? Alcohol use: Not Currently  ? Drug use: Yes  ?  Types: Marijuana, Cocaine  ? Sexual activity: Not Currently  ?Other Topics Concern  ? Not on file  ?Social History Narrative  ? Not on file  ? ?Social Determinants of Health  ? ?Financial Resource Strain: High Risk  ? Difficulty of Paying Living Expenses: Hard  ?Food Insecurity: No Food Insecurity  ? Worried About Charity fundraiser in the Last Year: Never true  ? Ran Out of Food in the Last Year: Never true  ?Transportation Needs: No Transportation Needs  ? Lack of Transportation (Medical): No  ? Lack of Transportation (Non-Medical): No  ?Physical Activity: Not on file  ?Stress: Not on file  ?Social Connections: Not on file  ?Intimate Partner Violence: Not on file  ? ? ?Physical Exam ?Vitals reviewed.  ?Constitutional:   ?   Appearance: Normal appearance. He is normal weight.  ?HENT:  ?   Head: Normocephalic.  ?   Nose: Nose normal.  ?   Mouth/Throat:  ?   Mouth: Mucous membranes are moist.  ?   Pharynx: Oropharynx is clear.  ?Eyes:  ?   Conjunctiva/sclera: Conjunctivae normal.  ?   Pupils: Pupils are equal, round, and reactive  to light.  ?   Comments: Left eye pain and redness.   ?Cardiovascular:  ?   Rate and Rhythm: Normal rate and regular rhythm.  ?   Pulses: Normal pulses.  ?   Heart sounds: Normal heart sounds.  ?Pulmonary:  ?   E

## 2021-12-06 NOTE — Telephone Encounter (Signed)
CSW called pt to check in.  Pt reports he has stayed away from substances at this time and continues to go to rehab at [facility] the [facility].  Only concern is not being able to get into account to pay gas bill- CSW provided with customer service number. ? ?Will continue to follow and assist as needed ? ?[name], LCSW ?Clinical Social Worker ?[facility] ?Desk#: [phone] ?Cell#: [phone] ? ?

## 2021-12-07 ENCOUNTER — Other Ambulatory Visit: Payer: Self-pay | Admitting: Nurse Practitioner

## 2021-12-08 NOTE — Telephone Encounter (Signed)
Requested medications are due for refill today.  unsure ? ?Requested medications are on the active medications list.  yes ? ?Last refill. 04/24/2021 #30 0 refills ? ?Future visit scheduled.   no ? ?Notes to clinic.  Labs are expired. ? ? ? ?Requested Prescriptions  ?Pending Prescriptions Disp Refills  ? FEROSUL 325 (65 Fe) MG tablet [Pharmacy Med Name: FEROSUL 325 (65 FE) MG ORAL TABLET] 90 tablet   ?  Sig: TAKE 1 TABLET (325 MG TOTAL) BY MOUTH DAILY AT NOON.  ?  ? Endocrinology:  Minerals - Iron Supplementation Failed - 12/07/2021 10:27 AM  ?  ?  Failed - Fe (serum) in normal range and within 360 days  ?  Iron  ?Date Value Ref Range Status  ?05/30/2019 14 (L) 45 - 182 ug/dL Final  ? ?Saturation Ratios  ?Date Value Ref Range Status  ?05/30/2019 4 (L) 17.9 - 39.5 % Final  ?  ?  ?  ?  Failed - Ferritin in normal range and within 360 days  ?  Ferritin  ?Date Value Ref Range Status  ?05/30/2019 13 (L) 24 - 336 ng/mL Final  ?  Comment:  ?  Performed at [facility], [place] [address], [place], [place] [address]  ?  ?  ?  ?  Passed - HGB in normal range and within 360 days  ?  Hemoglobin  ?Date Value Ref Range Status  ?10/19/2021 13.9 13.0 - 17.0 g/dL Final  ?12/21/2020 12.8 (L) 13.0 - 17.7 g/dL Final  ? ?Total hemoglobin  ?Date Value Ref Range Status  ?08/02/2019 10.1 (L) 12.0 - 16.0 g/dL Final  ?  ?  ?  ?  Passed - HCT in normal range and within 360 days  ?  HCT  ?Date Value Ref Range Status  ?10/19/2021 44.8 39.0 - 52.0 % Final  ? ?Hematocrit  ?Date Value Ref Range Status  ?12/21/2020 39.9 37.5 - 51.0 % Final  ?  ?  ?  ?  Passed - RBC in normal range and within 360 days  ?  RBC  ?Date Value Ref Range Status  ?10/19/2021 5.56 4.22 - 5.81 MIL/uL Final  ?  ?  ?  ?  Passed - Valid encounter within last 12 months  ?  Recent Outpatient Visits   ? ?      ? 11 months ago Essential hypertension  ? [place], [place] W, NP  ? 1 year ago Wound of abdomen  ? [place] [name], NP  ? 1 year ago Hospital discharge follow-up  ? [place], Vernia Buff, NP  ? 1 year ago Essential hypertension  ? [place], [place] W, NP  ? 1 year ago Diabetes mellitus type 2, uncontrolled, with complications ([place])  ? [place], Vernia Buff, NP  ? ?  ?  ? ?  ?  ?  ?  ?

## 2021-12-13 ENCOUNTER — Telehealth (HOSPITAL_COMMUNITY): Payer: Self-pay

## 2021-12-13 ENCOUNTER — Other Ambulatory Visit (HOSPITAL_COMMUNITY): Payer: Self-pay

## 2021-12-13 ENCOUNTER — Other Ambulatory Visit: Payer: Self-pay

## 2021-12-13 ENCOUNTER — Telehealth (HOSPITAL_COMMUNITY): Payer: Self-pay | Admitting: *Deleted

## 2021-12-13 ENCOUNTER — Ambulatory Visit (HOSPITAL_COMMUNITY)
Admission: RE | Admit: 2021-12-13 | Discharge: 2021-12-13 | Disposition: A | Discharge status: Home / Self Care | Payer: Medicaid Other | Source: Ambulatory Visit | Attending: Cardiology | Admitting: Cardiology

## 2021-12-13 DIAGNOSIS — I5032 Chronic diastolic (congestive) heart failure: Secondary | ICD-10-CM

## 2021-12-13 DIAGNOSIS — N390 Urinary tract infection, site not specified: Secondary | ICD-10-CM | POA: Diagnosis not present

## 2021-12-13 LAB — BASIC METABOLIC PANEL
Anion gap: 8 (ref 5–15)
BUN: 27 mg/dL — ABNORMAL HIGH (ref 6–20)
CO2: 32 mmol/L (ref 22–32)
Calcium: 9.4 mg/dL (ref 8.9–10.3)
Chloride: 95 mmol/L — ABNORMAL LOW (ref 98–111)
Creatinine, Ser: 1.77 mg/dL — ABNORMAL HIGH (ref 0.61–1.24)
GFR, Estimated: 45 mL/min — ABNORMAL LOW (ref >60)
Glucose, Bld: 104 mg/dL — ABNORMAL HIGH (ref 70–99)
Potassium: 4.2 mmol/L (ref 3.5–5.1)
Sodium: 135 mmol/L (ref 135–145)

## 2021-12-13 LAB — URINALYSIS, ROUTINE W REFLEX MICROSCOPIC
Bilirubin Urine: NEGATIVE
Glucose, UA: NEGATIVE mg/dL
Hgb urine dipstick: NEGATIVE
Ketones, ur: NEGATIVE mg/dL
Nitrite: NEGATIVE
Protein, ur: NEGATIVE mg/dL
Specific Gravity, Urine: 1.013 (ref 1.005–1.030)
pH: 6 (ref 5.0–8.0)

## 2021-12-13 NOTE — Telephone Encounter (Signed)
Added

## 2021-12-13 NOTE — Telephone Encounter (Signed)
Called Shawna to update him on urinalysis findings and the information of the medication to start BID for 7 days. He is agreeable and will pick it up tomorrow. I coached him on how to take it and how often. He agreed. Call complete.  ?

## 2021-12-13 NOTE — Progress Notes (Signed)
Paramedicine Encounter ? ? ? Patient ID: Benjamin [name], male    DOB: [birth year], 56 y.o.   MRN: [id number] ? ? ?Arrived for home visit for Benjamin [name] who reports having an injury to his right knee over the weekend. He reports falling into a hole while going to visit his mothers [place]. He says he heard something tear behind his right knee. No swelling noted. No deformity noted on palpation. He requested an appointment with [facility]. I set this up for him.  ? ?He also reports not peeing much over the last 5 days. He states he is straining to pee and it's darker in color and he has had right lower abdominal pain with stomach cramps. He has been taking his medications as prescribed with his bubble packs. I obtained vitals. No weight gain, no lower leg swelling or edema. I contacted HF clinic about same and they are able to see him today for labs at 1500. I will reassess once labs are completed to assist him with any med changed or further follow up. He agreed.  ? ?Home visit complete.  ? ? ? ?Patient Care Team: ?[name], NP as PCP - General (Nurse Practitioner) ?[name], MD as PCP - Cardiology (Cardiology) ?[name], LCSW as Education officer, museum (Licensed Holiday representative) ? ?Patient Active Problem List  ? Diagnosis Date Noted  ? Atrial fibrillation with RVR ([place]) 01/08/2021  ? Acute respiratory failure ([place]) 12/24/2020  ? Obstructive sleep apnea treated with BiPAP 12/24/2020  ? Polysubstance abuse ([place]) 08/20/2020  ? Alcohol abuse 08/20/2020  ? Atrial fibrillation with rapid ventricular response ([place]) 08/19/2020  ? Obesity hypoventilation syndrome ([place]) 06/12/2020  ? Healthcare maintenance 06/12/2020  ? Acute on chronic diastolic heart failure ([place]) 02/24/2020  ? Diabetic neuropathy ([place]) 11/20/2019  ? Coagulation disorder ([place]) 11/20/2019  ? Acute on chronic diastolic CHF (congestive heart failure), NYHA class 3 ([place]) 07/30/2019  ? Diabetes mellitus without complication ([place]) 46/96/2952  ?  Hypertensive emergency   ? BRBPR (bright red blood per rectum) 06/12/2019  ? Hypertension   ? CHF (congestive heart failure) ([place])   ? Sleep apnea   ? Chronic respiratory failure ([place])   ? Hypokalemia 05/29/2019  ? GI bleed 05/29/2019  ? Anemia 05/28/2019  ? Iron deficiency anemia 03/11/2019  ? OSA on CPAP 03/11/2019  ? HLD (hyperlipidemia) 03/11/2019  ? Elevated troponin 03/11/2019  ? GERD (gastroesophageal reflux disease) 03/11/2019  ? Rectal bleeding 02/20/2019  ? Dyspnea 02/06/2019  ? COVID-19 virus infection 02/06/2019  ? Bilateral lower extremity edema   ? Morbid obesity with BMI of 50.0-59.9, adult ([place])   ? PAF (paroxysmal atrial fibrillation) ([place])   ? PE (pulmonary thromboembolism) ([place]) 01/21/2019  ? Hypertensive urgency 01/21/2019  ? Diabetes mellitus type 2 in obese ([place]) 01/21/2019  ? ? ?Current Outpatient Medications:  ?  atorvastatin (LIPITOR) 40 MG tablet, Take 1 tablet (40 mg total) by mouth at bedtime., Disp: 90 tablet, Rfl: 3 ?  Blood Glucose Monitoring Suppl (TRUE METRIX METER) w/Device KIT, Use as instructed. Check blood glucose level by fingerstick twice per day., Disp: 1 kit, Rfl: 0 ?  Blood Pressure Monitor DEVI, Please provide patient with insurance approved blood pressure device with L-XL cuff. BMI 55, Disp: 1 each, Rfl: 0 ?  carboxymethylcellulose (REFRESH PLUS) 0.5 % SOLN, Place 1 drop into both eyes daily., Disp: , Rfl:  ?  diltiazem (CARDIZEM CD) 360 MG 24 hr capsule, Take 1 capsule (360 mg total) by mouth  daily., Disp: 90 capsule, Rfl: 3 ?  doxazosin (CARDURA) 2 MG tablet, Take 1 tablet (2 mg total) by mouth daily., Disp: 30 tablet, Rfl: 11 ?  doxycycline (VIBRAMYCIN) 100 MG capsule, Take 1 capsule (100 mg total) by mouth 2 (two) times daily., Disp: 20 capsule, Rfl: 0 ?  ferrous sulfate (FEROSUL) 325 (65 FE) MG tablet, TAKE 1 TABLET (325 MG TOTAL) BY MOUTH DAILY (NOON), Disp: 30 tablet, Rfl: 0 ?  glucose blood (TRUE METRIX BLOOD GLUCOSE TEST) test strip, Use as instructed. Check blood  glucose level by fingerstick twice per day., Disp: 100 each, Rfl: 12 ?  hydrALAZINE (APRESOLINE) 50 MG tablet, Take 1 tablet (50 mg total) by mouth 3 (three) times daily., Disp: 180 tablet, Rfl: 3 ?  losartan (COZAAR) 50 MG tablet, Take 1 tablet (50 mg total) by mouth daily., Disp: 90 tablet, Rfl: 3 ?  magnesium oxide (MAG-OX) 400 (240 Mg) MG tablet, Take 1 tablet (400 mg total) by mouth 2 (two) times daily., Disp: 90 tablet, Rfl: 3 ?  metFORMIN (GLUCOPHAGE) 500 MG tablet, TAKE ONE TABLET BY MOUTH TWICE DAILY WITH A MEAL (NOON+BEDTIME), Disp: 180 tablet, Rfl: 0 ?  metolazone (ZAROXOLYN) 2.5 MG tablet, Take only as directed by CHF clinic., Disp: 10 tablet, Rfl: 0 ?  Misc. Devices MISC, Requires O2 @ 3L/min continuously via nasal canula and home fill system,., Disp: 1 each, Rfl: 0 ?  Misc. Devices MISC, Please provide [name] with insurance approved portable O2 concentrator ICD 10 J96.11 Z99.81, Disp: 1 each, Rfl: 0 ?  Misc. Devices MISC, Please provide patient with insurance approved LARGE right hand splint ICD 10 G56.01, Disp: 1 each, Rfl: 0 ?  Misc. Devices MISC, Please provide patient wit insurance approved diabetic custom molded shoes (QTY 2) and custom molded multi density inserts (1 for each shoe)., Disp: 1 each, Rfl: 0 ?  omeprazole (PRILOSEC) 20 MG capsule, TAKE 1 CAPSULE 2 (TWO) TIMES DAILY BEFORE A MEAL. TAKE FIRST CAPSULE 30 MIN PRIOR TO EATING OR TAKING OTHER MEDICATIONS. (AM+BEDTIME), Disp: 180 capsule, Rfl: 1 ?  potassium chloride (KLOR-CON) 10 MEQ tablet, Take 8 tablets (80 mEq total) by mouth daily., Disp: 240 tablet, Rfl: 11 ?  PROVENTIL HFA 108 (90 Base) MCG/ACT inhaler, INHALE 2 PUFFS BY MOUTH EVERY 6 (SIX) HOURS AS NEEDED FOR WHEEZING OR SHORTNESS OF BREATH., Disp: 18 g, Rfl: 6 ?  rivaroxaban (XARELTO) 20 MG TABS tablet, Take 1 tablet (20 mg total) by mouth daily with supper., Disp: 90 tablet, Rfl: 3 ?  spironolactone (ALDACTONE) 50 MG tablet, Take 1 tablet (50 mg total) by mouth daily.,  Disp: 30 tablet, Rfl: 11 ?  STOOL SOFTENER 100 MG capsule, TAKE 1 CAPSULE BY MOUTH 2 (TWO) TIMES DAILY (NOON+BEDTIME), Disp: 60 capsule, Rfl: 0 ?  torsemide (DEMADEX) 20 MG tablet, Take 2 tablets (40 mg total) by mouth 2 (two) times daily., Disp: 60 tablet, Rfl: 11 ?  TRUEplus Lancets 28G MISC, Use as instructed. Check blood glucose level by fingerstick twice per day., Disp: 200 each, Rfl: 3 ?No Known Allergies ? ? ?Social History  ? ?Socioeconomic History  ? Marital status: Single  ?  Spouse name: Not on file  ? Number of children: Not on file  ? Years of education: Not on file  ? Highest education level: Not on file  ?Occupational History  ? Not on file  ?Tobacco Use  ? Smoking status: Former  ? Smokeless tobacco: Never  ? Tobacco comments:  ?  smoked  weed in the past  ?Vaping Use  ? Vaping Use: Never used  ?Substance and Sexual Activity  ? Alcohol use: Not Currently  ? Drug use: Yes  ?  Types: Marijuana, Cocaine  ? Sexual activity: Not Currently  ?Other Topics Concern  ? Not on file  ?Social History Narrative  ? Not on file  ? ?Social Determinants of Health  ? ?Financial Resource Strain: High Risk  ? Difficulty of Paying Living Expenses: Hard  ?Food Insecurity: No Food Insecurity  ? Worried About Charity fundraiser in the Last Year: Never true  ? Ran Out of Food in the Last Year: Never true  ?Transportation Needs: No Transportation Needs  ? Lack of Transportation (Medical): No  ? Lack of Transportation (Non-Medical): No  ?Physical Activity: Not on file  ?Stress: Not on file  ?Social Connections: Not on file  ?Intimate Partner Violence: Not on file  ? ? ?Physical Exam ?Vitals reviewed.  ?Constitutional:   ?   Appearance: Normal appearance. He is normal weight.  ?HENT:  ?   Head: Normocephalic.  ?   Nose: Nose normal.  ?   Mouth/Throat:  ?   Mouth: Mucous membranes are moist.  ?   Pharynx: Oropharynx is clear.  ?Eyes:  ?   Conjunctiva/sclera: Conjunctivae normal.  ?   Pupils: Pupils are equal, round, and reactive  to light.  ?Cardiovascular:  ?   Rate and Rhythm: Normal rate and regular rhythm.  ?   Pulses: Normal pulses.  ?   Heart sounds: Normal heart sounds.  ?Abdominal:  ?   Palpations: Abdomen is soft.  ?Musculos

## 2021-12-13 NOTE — Telephone Encounter (Signed)
Benjamin [name] w/paramedicine called to report pt is c/o right kidney pain and low urine output over the last 5days. Pts weight and vitals are stable. Pt would like to come for lab work today. Lab appt scheduled for BMET.  ? ?Will forward to Jessica Milford,FNP to see if we need urinalysis or any other labs. Pt is not followed by nephrology. ?

## 2021-12-14 ENCOUNTER — Other Ambulatory Visit (HOSPITAL_COMMUNITY): Payer: Self-pay | Admitting: Family Medicine

## 2021-12-14 MED ORDER — SULFAMETHOXAZOLE-TRIMETHOPRIM 800-160 MG PO TABS: 1.0000 | ORAL_TABLET | Freq: Two times a day (BID) | ORAL | 0 refills | Status: DC

## 2021-12-15 ENCOUNTER — Ambulatory Visit: Payer: Medicaid Other | Admitting: Orthopedic Surgery

## 2021-12-20 ENCOUNTER — Telehealth (HOSPITAL_COMMUNITY): Payer: Self-pay

## 2021-12-20 NOTE — Telephone Encounter (Signed)
Attempted to call to schedule Benjamin [name] for home visit. No answer and VM box full. I will continue to reach out.  ?

## 2021-12-27 ENCOUNTER — Telehealth (HOSPITAL_COMMUNITY): Payer: Self-pay

## 2021-12-27 NOTE — Telephone Encounter (Signed)
Attempted to reach Benjamin [name] for home visit today with no answer. I left a voicemail instructing him to return my call. I will continue to follow up.  ?

## 2021-12-28 ENCOUNTER — Telehealth (HOSPITAL_COMMUNITY): Payer: Self-pay | Admitting: *Deleted

## 2021-12-28 NOTE — Telephone Encounter (Signed)
Referral faxed to [facility] 

## 2021-12-29 ENCOUNTER — Telehealth (HOSPITAL_COMMUNITY): Payer: Self-pay

## 2021-12-29 DIAGNOSIS — N430 Encysted hydrocele: Secondary | ICD-10-CM | POA: Diagnosis not present

## 2021-12-29 DIAGNOSIS — R3912 Poor urinary stream: Secondary | ICD-10-CM | POA: Diagnosis not present

## 2021-12-29 NOTE — Telephone Encounter (Signed)
Spoke to McNeil, he reports that he is at [facility] and that he has an "infection". I advised him to let the nurses or providers know to reach out to me if needed. I will follow up in the home tomorrow. He agreed with plan. Call complete.  ?

## 2021-12-30 ENCOUNTER — Other Ambulatory Visit: Payer: Self-pay | Admitting: Nurse Practitioner

## 2021-12-30 ENCOUNTER — Telehealth (HOSPITAL_COMMUNITY): Payer: Self-pay

## 2021-12-30 ENCOUNTER — Other Ambulatory Visit (HOSPITAL_COMMUNITY): Payer: Self-pay

## 2021-12-30 NOTE — Telephone Encounter (Signed)
error 

## 2021-12-30 NOTE — Progress Notes (Signed)
Paramedicine Encounter ? ? ? Patient ID: Benjamin [name], male    DOB: 02/01/1966, 56 y.o.   MRN: [id number] ? ? ?Arrived for home visit for Benjamin [name] who reports having some testicular pain with difficulty urinating. He was seen by [facility]. He was taken off Doxazosin and started in Tamsulosin as of today. I called and updated [name] of same.  ? ?I filled pill box until Monday. He will need  ?Medicines by Monday. I ensured [name] is filling bubble packs and will be delivered tomorrow.  ? ?Vitals and assessment as noted. Urine output decreased. No lower leg swelling. Lungs clear. No abdominal distention noted.  ? ?Appointments rescheduled for [place] today.  ? ?-[facility] tomorrow and 5/10 at 10. ? ?-Eye doctor 4/27 at 11:00 ? ?-[facility] 5/10 at 0915  ? ?Home visit complete. I plan to see him in one week.  ? ?CBG- 122  ? ?Patient Care Team: ?[name], NP as PCP - General (Nurse Practitioner) ?[name], MD as PCP - Cardiology (Cardiology) ?[name], LCSW as Education officer, museum (Licensed Holiday representative) ? ?Patient Active Problem List  ? Diagnosis Date Noted  ? Atrial fibrillation with RVR ([place]) 01/08/2021  ? Acute respiratory failure ([place]) 12/24/2020  ? Obstructive sleep apnea treated with BiPAP 12/24/2020  ? Polysubstance abuse ([place]) 08/20/2020  ? Alcohol abuse 08/20/2020  ? Atrial fibrillation with rapid ventricular response ([place]) 08/19/2020  ? Obesity hypoventilation syndrome ([place]) 06/12/2020  ? Healthcare maintenance 06/12/2020  ? Acute on chronic diastolic heart failure ([place]) 02/24/2020  ? Diabetic neuropathy ([place]) 11/20/2019  ? Coagulation disorder ([place]) 11/20/2019  ? Acute on chronic diastolic CHF (congestive heart failure), NYHA class 3 ([place]) 07/30/2019  ? Diabetes mellitus without complication ([place]) 27/03/8674  ? Hypertensive emergency   ? BRBPR (bright red blood per rectum) 06/12/2019  ? Hypertension   ? CHF (congestive heart failure) ([place])   ? Sleep apnea    ? Chronic respiratory failure ([place])   ? Hypokalemia 05/29/2019  ? GI bleed 05/29/2019  ? Anemia 05/28/2019  ? Iron deficiency anemia 03/11/2019  ? OSA on CPAP 03/11/2019  ? HLD (hyperlipidemia) 03/11/2019  ? Elevated troponin 03/11/2019  ? GERD (gastroesophageal reflux disease) 03/11/2019  ? Rectal bleeding 02/20/2019  ? Dyspnea 02/06/2019  ? COVID-19 virus infection 02/06/2019  ? Bilateral lower extremity edema   ? Morbid obesity with BMI of 50.0-59.9, adult ([place])   ? PAF (paroxysmal atrial fibrillation) ([place])   ? PE (pulmonary thromboembolism) ([place]) 01/21/2019  ? Hypertensive urgency 01/21/2019  ? Diabetes mellitus type 2 in obese ([place]) 01/21/2019  ? ? ?Current Outpatient Medications:  ?  atorvastatin (LIPITOR) 40 MG tablet, Take 1 tablet (40 mg total) by mouth at bedtime., Disp: 90 tablet, Rfl: 3 ?  Blood Glucose Monitoring Suppl (TRUE METRIX METER) w/Device KIT, Use as instructed. Check blood glucose level by fingerstick twice per day., Disp: 1 kit, Rfl: 0 ?  Blood Pressure Monitor DEVI, Please provide patient with insurance approved blood pressure device with L-XL cuff. BMI 55, Disp: 1 each, Rfl: 0 ?  carboxymethylcellulose (REFRESH PLUS) 0.5 % SOLN, Place 1 drop into both eyes daily., Disp: , Rfl:  ?  diltiazem (CARDIZEM CD) 360 MG 24 hr capsule, Take 1 capsule (360 mg total) by mouth daily., Disp: 90 capsule, Rfl: 3 ?  doxazosin (CARDURA) 2 MG tablet, Take 1 tablet (2 mg total) by mouth daily., Disp: 30 tablet, Rfl: 11 ?  ferrous sulfate (FEROSUL) 325 (  65 FE) MG tablet, TAKE 1 TABLET (325 MG TOTAL) BY MOUTH DAILY (NOON), Disp: 30 tablet, Rfl: 0 ?  glucose blood (TRUE METRIX BLOOD GLUCOSE TEST) test strip, Use as instructed. Check blood glucose level by fingerstick twice per day., Disp: 100 each, Rfl: 12 ?  hydrALAZINE (APRESOLINE) 50 MG tablet, Take 1 tablet (50 mg total) by mouth 3 (three) times daily., Disp: 180 tablet, Rfl: 3 ?  losartan (COZAAR) 50 MG tablet, Take 1 tablet (50 mg total) by mouth daily.,  Disp: 90 tablet, Rfl: 3 ?  magnesium oxide (MAG-OX) 400 (240 Mg) MG tablet, Take 1 tablet (400 mg total) by mouth 2 (two) times daily., Disp: 90 tablet, Rfl: 3 ?  metFORMIN (GLUCOPHAGE) 500 MG tablet, TAKE ONE TABLET BY MOUTH TWICE DAILY WITH A MEAL (NOON+BEDTIME), Disp: 180 tablet, Rfl: 0 ?  metolazone (ZAROXOLYN) 2.5 MG tablet, Take only as directed by CHF clinic., Disp: 10 tablet, Rfl: 0 ?  Misc. Devices MISC, Requires O2 @ 3L/min continuously via nasal canula and home fill system,., Disp: 1 each, Rfl: 0 ?  Misc. Devices MISC, Please provide Mr. Seifer with insurance approved portable O2 concentrator ICD 10 J96.11 Z99.81, Disp: 1 each, Rfl: 0 ?  Misc. Devices MISC, Please provide patient with insurance approved LARGE right hand splint ICD 10 G56.01, Disp: 1 each, Rfl: 0 ?  Misc. Devices MISC, Please provide patient wit insurance approved diabetic custom molded shoes (QTY 2) and custom molded multi density inserts (1 for each shoe)., Disp: 1 each, Rfl: 0 ?  omeprazole (PRILOSEC) 20 MG capsule, TAKE 1 CAPSULE 2 (TWO) TIMES DAILY BEFORE A MEAL. TAKE FIRST CAPSULE 30 MIN PRIOR TO EATING OR TAKING OTHER MEDICATIONS. (AM+BEDTIME), Disp: 180 capsule, Rfl: 1 ?  potassium chloride (KLOR-CON) 10 MEQ tablet, Take 8 tablets (80 mEq total) by mouth daily., Disp: 240 tablet, Rfl: 11 ?  PROVENTIL HFA 108 (90 Base) MCG/ACT inhaler, INHALE 2 PUFFS BY MOUTH EVERY 6 (SIX) HOURS AS NEEDED FOR WHEEZING OR SHORTNESS OF BREATH., Disp: 18 g, Rfl: 6 ?  rivaroxaban (XARELTO) 20 MG TABS tablet, Take 1 tablet (20 mg total) by mouth daily with supper., Disp: 90 tablet, Rfl: 3 ?  spironolactone (ALDACTONE) 50 MG tablet, Take 1 tablet (50 mg total) by mouth daily., Disp: 30 tablet, Rfl: 11 ?  STOOL SOFTENER 100 MG capsule, TAKE 1 CAPSULE BY MOUTH 2 (TWO) TIMES DAILY (NOON+BEDTIME), Disp: 60 capsule, Rfl: 0 ?  sulfamethoxazole-trimethoprim (BACTRIM DS) 800-160 MG tablet, Take 1 tablet by mouth 2 (two) times daily., Disp: 14 tablet, Rfl: 0 ?   torsemide (DEMADEX) 20 MG tablet, Take 2 tablets (40 mg total) by mouth 2 (two) times daily., Disp: 60 tablet, Rfl: 11 ?  TRUEplus Lancets 28G MISC, Use as instructed. Check blood glucose level by fingerstick twice per day., Disp: 200 each, Rfl: 3 ?No Known Allergies ? ? ?Social History  ? ?Socioeconomic History  ? Marital status: Single  ?  Spouse name: Not on file  ? Number of children: Not on file  ? Years of education: Not on file  ? Highest education level: Not on file  ?Occupational History  ? Not on file  ?Tobacco Use  ? Smoking status: Former  ? Smokeless tobacco: Never  ? Tobacco comments:  ?  smoked weed in the past  ?Vaping Use  ? Vaping Use: Never used  ?Substance and Sexual Activity  ? Alcohol use: Not Currently  ? Drug use: Yes  ?  Types: Marijuana, Cocaine  ?  Sexual activity: Not Currently  ?Other Topics Concern  ? Not on file  ?Social History Narrative  ? Not on file  ? ?Social Determinants of Health  ? ?Financial Resource Strain: High Risk  ? Difficulty of Paying Living Expenses: Hard  ?Food Insecurity: No Food Insecurity  ? Worried About Charity fundraiser in the Last Year: Never true  ? Ran Out of Food in the Last Year: Never true  ?Transportation Needs: No Transportation Needs  ? Lack of Transportation (Medical): No  ? Lack of Transportation (Non-Medical): No  ?Physical Activity: Not on file  ?Stress: Not on file  ?Social Connections: Not on file  ?Intimate Partner Violence: Not on file  ? ? ?Physical Exam ?Vitals reviewed.  ?Constitutional:   ?   Appearance: Normal appearance. He is obese.  ?HENT:  ?   Head: Normocephalic.  ?   Nose: Nose normal.  ?   Mouth/Throat:  ?   Mouth: Mucous membranes are moist.  ?   Pharynx: Oropharynx is clear.  ?Eyes:  ?   Conjunctiva/sclera: Conjunctivae normal.  ?   Pupils: Pupils are equal, round, and reactive to light.  ?Cardiovascular:  ?   Rate and Rhythm: Normal rate and regular rhythm.  ?   Pulses: Normal pulses.  ?   Heart sounds: Normal heart sounds.   ?Pulmonary:  ?   Effort: Pulmonary effort is normal. No respiratory distress.  ?   Breath sounds: Normal breath sounds.  ?Abdominal:  ?   General: Abdomen is flat.  ?   Palpations: Abdomen is soft.  ?Ge

## 2021-12-30 NOTE — Telephone Encounter (Signed)
Call received from [facility]. [name] is needing his Ferrous Sulfate sent in to [facility] so they can refill his bubble packs as soon as possible as he will need his medications by Sunday. Call routed to PCP [facility]. Call complete.  ?

## 2021-12-30 NOTE — Telephone Encounter (Signed)
Requested medication (s) are due for refill today:   Yes ? ?Requested medication (s) are on the active medication list:   Yes ? ?Future visit scheduled:   Yes called and got him scheduled with [name] in July ? ? ?Last ordered: 05/04/2021 #30, 0 refills ? ?Returned because labs are out of date per protocol.     ? ?Requested Prescriptions  ?Pending Prescriptions Disp Refills  ? FEROSUL 325 (65 Fe) MG tablet [Pharmacy Med Name: FEROSUL 325 (65 FE) MG ORAL TABLET] 90 tablet   ?  Sig: TAKE 1 TABLET (325 MG TOTAL) BY MOUTH DAILY AT NOON.  ?  ? Endocrinology:  Minerals - Iron Supplementation Failed - 12/30/2021  9:29 AM  ?  ?  Failed - Fe (serum) in normal range and within 360 days  ?  Iron  ?Date Value Ref Range Status  ?05/30/2019 14 (L) 45 - 182 ug/dL Final  ? ?Saturation Ratios  ?Date Value Ref Range Status  ?05/30/2019 4 (L) 17.9 - 39.5 % Final  ?  ?  ?  ?  Failed - Ferritin in normal range and within 360 days  ?  Ferritin  ?Date Value Ref Range Status  ?05/30/2019 13 (L) 24 - 336 ng/mL Final  ?  Comment:  ?  Performed at [facility], [address] [place], [place] [address]  ?  ?  ?  ?  Passed - HGB in normal range and within 360 days  ?  Hemoglobin  ?Date Value Ref Range Status  ?10/19/2021 13.9 13.0 - 17.0 g/dL Final  ?19/14/7829 56.2 (L) 13.0 - 17.7 g/dL Final  ? ?Total hemoglobin  ?Date Value Ref Range Status  ?08/02/2019 10.1 (L) 12.0 - 16.0 g/dL Final  ?  ?  ?  ?  Passed - HCT in normal range and within 360 days  ?  HCT  ?Date Value Ref Range Status  ?10/19/2021 44.8 39.0 - 52.0 % Final  ? ?Hematocrit  ?Date Value Ref Range Status  ?12/21/2020 39.9 37.5 - 51.0 % Final  ?  ?  ?  ?  Passed - RBC in normal range and within 360 days  ?  RBC  ?Date Value Ref Range Status  ?10/19/2021 5.56 4.22 - 5.81 MIL/uL Final  ?  ?  ?  ?  Passed - Valid encounter within last 12 months  ?  Recent Outpatient Visits   ? ?      ? 1 year ago Essential hypertension  ? [facility] [place], [place] W,  NP  ? 1 year ago Wound of abdomen  ? [facility] Claiborne Rigg, NP  ? 1 year ago Hospital discharge follow-up  ? [facility] [place], Shea Stakes, NP  ? 1 year ago Essential hypertension  ? [facility] [place], [place] W, NP  ? 1 year ago Diabetes mellitus type 2, uncontrolled, with complications (HCC)  ? [facility] Claiborne Rigg, NP  ? ?  ?  ?Future Appointments   ? ?        ? In 3 months Claiborne Rigg, NP [facility]  ? ?  ? ?  ?  ?  ? ?

## 2021-12-31 ENCOUNTER — Other Ambulatory Visit: Payer: Self-pay | Admitting: Nurse Practitioner

## 2021-12-31 DIAGNOSIS — I5032 Chronic diastolic (congestive) heart failure: Secondary | ICD-10-CM | POA: Diagnosis not present

## 2021-12-31 DIAGNOSIS — J9611 Chronic respiratory failure with hypoxia: Secondary | ICD-10-CM | POA: Diagnosis not present

## 2021-12-31 NOTE — Telephone Encounter (Signed)
It has already been sent 12-30-2021 ?

## 2022-01-05 ENCOUNTER — Telehealth (HOSPITAL_COMMUNITY): Payer: Self-pay

## 2022-01-05 NOTE — Telephone Encounter (Signed)
Rahim returned my call and reports he is doing well and has [facility] follow up this week. He reports he is urinating a little more than last week and is feeling a little better. He reports he got his bubble packs from [facility] [employer] and has no needs at this time. I will follow up in the home on Monday. Call complete.  ?

## 2022-01-05 NOTE — Telephone Encounter (Signed)
I have called several times over the last two days in attempt to reach [facility] for home paramedicine visit. Left message will continue to reach out.  ?

## 2022-01-10 ENCOUNTER — Telehealth (HOSPITAL_COMMUNITY): Payer: Self-pay

## 2022-01-10 NOTE — Telephone Encounter (Signed)
Sir returned my call reporting he is doing well and has his medications (bubble packs) from [facility] [employer]. He declined home visit this week and reports we can follow up next week. I will reach back out next week for same. Call complete.  ?

## 2022-01-10 NOTE — Telephone Encounter (Signed)
Left message for Benjamin [name] to return my call for setting up paramedicine visit this week. Will continue to follow up.  ?

## 2022-01-12 ENCOUNTER — Telehealth (HOSPITAL_COMMUNITY): Payer: Self-pay | Admitting: Licensed Clinical Social Worker

## 2022-01-12 NOTE — Telephone Encounter (Addendum)
HF Paramedicine Team Based Care Meeting ? ?HF MD- NA  ?HF NP - Amy Clegg NP-C  ? ?[facility] Paramedicine  ?Benjamin [name] ?Benjamin [name]  ?Benjamin [name] ? ?Hospital admit within the last 30 days for heart failure? no ? ?Medications concerns? Doing well- bubble packs ? ?Transportation issues ? none ? ?Education needs? No immediate concerns ? ?SDOH -none ? ?Eligible for discharge? Next appt 5/12 will re-evaluate during that appt ? ?[name], LCSW ?Clinical Social Worker ?[facility] ?Desk#: [phone] ?Cell#: [phone] ? ?

## 2022-01-17 ENCOUNTER — Other Ambulatory Visit (HOSPITAL_COMMUNITY): Payer: Self-pay

## 2022-01-17 ENCOUNTER — Other Ambulatory Visit: Payer: Self-pay | Admitting: Nurse Practitioner

## 2022-01-17 ENCOUNTER — Telehealth (HOSPITAL_COMMUNITY): Payer: Self-pay

## 2022-01-17 DIAGNOSIS — E119 Type 2 diabetes mellitus without complications: Secondary | ICD-10-CM

## 2022-01-17 NOTE — Telephone Encounter (Signed)
Benjamin [name] called me requesting diabetic shoes. He and [name] report him getting these through a company in [place]. [name] LCSW with AHF clinic emailed me the form. I will forward this to [place] as it has to be filled out by PCP. I advised him I would be sending this to his PCP office. He agreed and was thankful for follow up.  ?

## 2022-01-17 NOTE — Progress Notes (Signed)
Paramedicine Encounter ? ? ? Patient ID: Benjamin [name], male    DOB: Mar 14, 1966, 56 y.o.   MRN: [id number] ? ? ? ?Arrived for home visit for Benjamin [name] who reports feeling okay but having continued issues with enlarged testicles and pain in his groin area with pain and difficulty urinating. He is being followed by [facility] and is awaiting a procedure on 5/9. He denied chest pain, dizziness, shortness of breath or lower leg swelling.  ? ?I obtained vitals and assessment. No lower leg swelling noted. Weight stable at 355lbs.  ? ?I reviewed bubble packs and same are correct and he is compliant with same.  ?He reports needing glucometer test strips and an inhaler. I will request same from [facility].  ? ?We reviewed upcoming appointments and I wrote same down for reminders.  ? ?Home visit complete. I will follow up in two weeks.  ? ?Refills:  ?Will need bubble packs in one week ?Proventil Inhaler  ?Test Strips ? ? ? ? ?Patient Care Team: ?[name], NP as PCP - General (Nurse Practitioner) ?[name], MD as PCP - Cardiology (Cardiology) ?[name], LCSW as Education officer, museum (Licensed Holiday representative) ? ?Patient Active Problem List  ? Diagnosis Date Noted  ? Atrial fibrillation with RVR ([place]) 01/08/2021  ? Acute respiratory failure ([place]) 12/24/2020  ? Obstructive sleep apnea treated with BiPAP 12/24/2020  ? Polysubstance abuse ([place]) 08/20/2020  ? Alcohol abuse 08/20/2020  ? Atrial fibrillation with rapid ventricular response ([place]) 08/19/2020  ? Obesity hypoventilation syndrome ([place]) 06/12/2020  ? Healthcare maintenance 06/12/2020  ? Acute on chronic diastolic heart failure ([place]) 02/24/2020  ? Diabetic neuropathy ([place]) 11/20/2019  ? Coagulation disorder ([place]) 11/20/2019  ? Acute on chronic diastolic CHF (congestive heart failure), NYHA class 3 ([place]) 07/30/2019  ? Diabetes mellitus without complication ([place]) 37/48/2707  ? Hypertensive emergency   ? BRBPR (bright red blood per rectum)  06/12/2019  ? Hypertension   ? CHF (congestive heart failure) ([place])   ? Sleep apnea   ? Chronic respiratory failure ([place])   ? Hypokalemia 05/29/2019  ? GI bleed 05/29/2019  ? Anemia 05/28/2019  ? Iron deficiency anemia 03/11/2019  ? OSA on CPAP 03/11/2019  ? HLD (hyperlipidemia) 03/11/2019  ? Elevated troponin 03/11/2019  ? GERD (gastroesophageal reflux disease) 03/11/2019  ? Rectal bleeding 02/20/2019  ? Dyspnea 02/06/2019  ? COVID-19 virus infection 02/06/2019  ? Bilateral lower extremity edema   ? Morbid obesity with BMI of 50.0-59.9, adult ([place])   ? PAF (paroxysmal atrial fibrillation) ([place])   ? PE (pulmonary thromboembolism) ([place]) 01/21/2019  ? Hypertensive urgency 01/21/2019  ? Diabetes mellitus type 2 in obese ([place]) 01/21/2019  ? ? ?Current Outpatient Medications:  ?  atorvastatin (LIPITOR) 40 MG tablet, Take 1 tablet (40 mg total) by mouth at bedtime., Disp: 90 tablet, Rfl: 3 ?  Blood Glucose Monitoring Suppl (TRUE METRIX METER) w/Device KIT, Use as instructed. Check blood glucose level by fingerstick twice per day., Disp: 1 kit, Rfl: 0 ?  Blood Pressure Monitor DEVI, Please provide patient with insurance approved blood pressure device with L-XL cuff. BMI 55, Disp: 1 each, Rfl: 0 ?  carboxymethylcellulose (REFRESH PLUS) 0.5 % SOLN, Place 1 drop into both eyes daily., Disp: , Rfl:  ?  diltiazem (CARDIZEM CD) 360 MG 24 hr capsule, Take 1 capsule (360 mg total) by mouth daily., Disp: 90 capsule, Rfl: 3 ?  doxazosin (CARDURA) 2 MG tablet, Take 1 tablet (2 mg total)  by mouth daily. (Patient not taking: Reported on 12/30/2021), Disp: 30 tablet, Rfl: 11 ?  ferrous sulfate (FEROSUL) 325 (65 FE) MG tablet, TAKE 1 TABLET (325 MG TOTAL) BY MOUTH DAILY AT NOON., Disp: 90 tablet, Rfl: 0 ?  glucose blood (TRUE METRIX BLOOD GLUCOSE TEST) test strip, Use as instructed. Check blood glucose level by fingerstick twice per day., Disp: 100 each, Rfl: 12 ?  hydrALAZINE (APRESOLINE) 50 MG tablet, Take 1 tablet (50 mg total) by  mouth 3 (three) times daily., Disp: 180 tablet, Rfl: 3 ?  losartan (COZAAR) 50 MG tablet, Take 1 tablet (50 mg total) by mouth daily., Disp: 90 tablet, Rfl: 3 ?  magnesium oxide (MAG-OX) 400 (240 Mg) MG tablet, Take 1 tablet (400 mg total) by mouth 2 (two) times daily., Disp: 90 tablet, Rfl: 3 ?  metFORMIN (GLUCOPHAGE) 500 MG tablet, TAKE ONE TABLET BY MOUTH TWICE DAILY WITH A MEAL (NOON+BEDTIME), Disp: 180 tablet, Rfl: 0 ?  metolazone (ZAROXOLYN) 2.5 MG tablet, Take only as directed by CHF clinic., Disp: 10 tablet, Rfl: 0 ?  Misc. Devices MISC, Requires O2 @ 3L/min continuously via nasal canula and home fill system,., Disp: 1 each, Rfl: 0 ?  Misc. Devices MISC, Please provide Mr. Nutting with insurance approved portable O2 concentrator ICD 10 J96.11 Z99.81, Disp: 1 each, Rfl: 0 ?  Misc. Devices MISC, Please provide patient with insurance approved LARGE right hand splint ICD 10 G56.01, Disp: 1 each, Rfl: 0 ?  Misc. Devices MISC, Please provide patient wit insurance approved diabetic custom molded shoes (QTY 2) and custom molded multi density inserts (1 for each shoe)., Disp: 1 each, Rfl: 0 ?  omeprazole (PRILOSEC) 20 MG capsule, TAKE 1 CAPSULE 2 (TWO) TIMES DAILY BEFORE A MEAL. TAKE FIRST CAPSULE 30 MIN PRIOR TO EATING OR TAKING OTHER MEDICATIONS. (AM+BEDTIME), Disp: 180 capsule, Rfl: 1 ?  potassium chloride (KLOR-CON) 10 MEQ tablet, Take 8 tablets (80 mEq total) by mouth daily., Disp: 240 tablet, Rfl: 11 ?  PROVENTIL HFA 108 (90 Base) MCG/ACT inhaler, INHALE 2 PUFFS BY MOUTH EVERY 6 (SIX) HOURS AS NEEDED FOR WHEEZING OR SHORTNESS OF BREATH., Disp: 18 g, Rfl: 6 ?  rivaroxaban (XARELTO) 20 MG TABS tablet, Take 1 tablet (20 mg total) by mouth daily with supper., Disp: 90 tablet, Rfl: 3 ?  spironolactone (ALDACTONE) 50 MG tablet, Take 1 tablet (50 mg total) by mouth daily., Disp: 30 tablet, Rfl: 11 ?  STOOL SOFTENER 100 MG capsule, TAKE 1 CAPSULE BY MOUTH 2 (TWO) TIMES DAILY (NOON+BEDTIME), Disp: 60 capsule, Rfl: 0 ?   sulfamethoxazole-trimethoprim (BACTRIM DS) 800-160 MG tablet, Take 1 tablet by mouth 2 (two) times daily., Disp: 14 tablet, Rfl: 0 ?  tamsulosin (FLOMAX) 0.4 MG CAPS capsule, Take 0.4 mg by mouth daily after supper., Disp: , Rfl:  ?  torsemide (DEMADEX) 20 MG tablet, Take 2 tablets (40 mg total) by mouth 2 (two) times daily., Disp: 60 tablet, Rfl: 11 ?  TRUEplus Lancets 28G MISC, Use as instructed. Check blood glucose level by fingerstick twice per day., Disp: 200 each, Rfl: 3 ?No Known Allergies ? ? ?Social History  ? ?Socioeconomic History  ? Marital status: Single  ?  Spouse name: Not on file  ? Number of children: Not on file  ? Years of education: Not on file  ? Highest education level: Not on file  ?Occupational History  ? Not on file  ?Tobacco Use  ? Smoking status: Former  ? Smokeless tobacco: Never  ?  Tobacco comments:  ?  smoked weed in the past  ?Vaping Use  ? Vaping Use: Never used  ?Substance and Sexual Activity  ? Alcohol use: Not Currently  ? Drug use: Yes  ?  Types: Marijuana, Cocaine  ? Sexual activity: Not Currently  ?Other Topics Concern  ? Not on file  ?Social History Narrative  ? Not on file  ? ?Social Determinants of Health  ? ?Financial Resource Strain: High Risk  ? Difficulty of Paying Living Expenses: Hard  ?Food Insecurity: No Food Insecurity  ? Worried About Charity fundraiser in the Last Year: Never true  ? Ran Out of Food in the Last Year: Never true  ?Transportation Needs: No Transportation Needs  ? Lack of Transportation (Medical): No  ? Lack of Transportation (Non-Medical): No  ?Physical Activity: Not on file  ?Stress: Not on file  ?Social Connections: Not on file  ?Intimate Partner Violence: Not on file  ? ? ?Physical Exam ?Vitals reviewed.  ?Constitutional:   ?   Appearance: Normal appearance. He is obese.  ?HENT:  ?   Head: Normocephalic.  ?   Nose: Nose normal.  ?   Mouth/Throat:  ?   Mouth: Mucous membranes are moist.  ?   Pharynx: Oropharynx is clear.  ?Eyes:  ?    Conjunctiva/sclera: Conjunctivae normal.  ?   Pupils: Pupils are equal, round, and reactive to light.  ?Cardiovascular:  ?   Rate and Rhythm: Normal rate and regular rhythm.  ?   Pulses: Normal pulses.  ?   Heart sounds: Nor

## 2022-01-17 NOTE — Telephone Encounter (Signed)
Needs appt with podiatry first. He has been referred and they will call him to schedule ?

## 2022-01-25 DIAGNOSIS — N430 Encysted hydrocele: Secondary | ICD-10-CM | POA: Diagnosis not present

## 2022-01-26 ENCOUNTER — Ambulatory Visit: Payer: Medicaid Other | Admitting: Orthopedic Surgery

## 2022-01-27 ENCOUNTER — Ambulatory Visit: Payer: Medicaid Other | Admitting: Pulmonary Disease

## 2022-01-28 ENCOUNTER — Ambulatory Visit (HOSPITAL_BASED_OUTPATIENT_CLINIC_OR_DEPARTMENT_OTHER)
Admission: RE | Admit: 2022-01-28 | Discharge: 2022-01-28 | Disposition: A | Discharge status: Home / Self Care | Payer: Medicaid Other | Source: Ambulatory Visit | Attending: Cardiology | Admitting: Cardiology

## 2022-01-28 ENCOUNTER — Ambulatory Visit (HOSPITAL_COMMUNITY)
Admission: RE | Admit: 2022-01-28 | Discharge: 2022-01-28 | Disposition: A | Discharge status: Home / Self Care | Payer: Medicaid Other | Source: Ambulatory Visit | Attending: Cardiology | Admitting: Cardiology

## 2022-01-28 VITALS — BP 158/100 | HR 85 | Ht 71.0 in | Wt 360.2 lb

## 2022-01-28 DIAGNOSIS — Z9981 Dependence on supplemental oxygen: Secondary | ICD-10-CM | POA: Diagnosis not present

## 2022-01-28 DIAGNOSIS — I110 Hypertensive heart disease with heart failure: Secondary | ICD-10-CM

## 2022-01-28 DIAGNOSIS — I5032 Chronic diastolic (congestive) heart failure: Secondary | ICD-10-CM

## 2022-01-28 DIAGNOSIS — Z7901 Long term (current) use of anticoagulants: Secondary | ICD-10-CM | POA: Diagnosis not present

## 2022-01-28 DIAGNOSIS — Z86711 Personal history of pulmonary embolism: Secondary | ICD-10-CM | POA: Insufficient documentation

## 2022-01-28 DIAGNOSIS — I130 Hypertensive heart and chronic kidney disease with heart failure and stage 1 through stage 4 chronic kidney disease, or unspecified chronic kidney disease: Secondary | ICD-10-CM | POA: Insufficient documentation

## 2022-01-28 DIAGNOSIS — F1910 Other psychoactive substance abuse, uncomplicated: Secondary | ICD-10-CM

## 2022-01-28 DIAGNOSIS — G4733 Obstructive sleep apnea (adult) (pediatric): Secondary | ICD-10-CM | POA: Diagnosis not present

## 2022-01-28 DIAGNOSIS — E782 Mixed hyperlipidemia: Secondary | ICD-10-CM | POA: Diagnosis not present

## 2022-01-28 DIAGNOSIS — I480 Paroxysmal atrial fibrillation: Secondary | ICD-10-CM

## 2022-01-28 DIAGNOSIS — I509 Heart failure, unspecified: Secondary | ICD-10-CM

## 2022-01-28 DIAGNOSIS — Z79899 Other long term (current) drug therapy: Secondary | ICD-10-CM | POA: Insufficient documentation

## 2022-01-28 DIAGNOSIS — Z6841 Body Mass Index (BMI) 40.0 and over, adult: Secondary | ICD-10-CM | POA: Insufficient documentation

## 2022-01-28 DIAGNOSIS — E119 Type 2 diabetes mellitus without complications: Secondary | ICD-10-CM | POA: Diagnosis not present

## 2022-01-28 DIAGNOSIS — E662 Morbid (severe) obesity with alveolar hypoventilation: Secondary | ICD-10-CM

## 2022-01-28 LAB — ECHOCARDIOGRAM COMPLETE
Area-P 1/2: 2.39 cm2
S' Lateral: 3 cm

## 2022-01-28 LAB — BASIC METABOLIC PANEL
Anion gap: 7 (ref 5–15)
BUN: 15 mg/dL (ref 6–20)
CO2: 31 mmol/L (ref 22–32)
Calcium: 9.5 mg/dL (ref 8.9–10.3)
Chloride: 102 mmol/L (ref 98–111)
Creatinine, Ser: 1.3 mg/dL — ABNORMAL HIGH (ref 0.61–1.24)
GFR, Estimated: >60 mL/min (ref >60)
Glucose, Bld: 97 mg/dL (ref 70–99)
Potassium: 4.2 mmol/L (ref 3.5–5.1)
Sodium: 140 mmol/L (ref 135–145)

## 2022-01-28 LAB — LIPID PANEL
Cholesterol: 126 mg/dL (ref 0–200)
HDL: 46 mg/dL (ref >40)
LDL Cholesterol: 67 mg/dL (ref 0–99)
Total CHOL/HDL Ratio: 2.7 RATIO
Triglycerides: 65 mg/dL (ref <150)
VLDL: 13 mg/dL (ref 0–40)

## 2022-01-28 LAB — BRAIN NATRIURETIC PEPTIDE: B Natriuretic Peptide: 10.1 pg/mL (ref 0.0–100.0)

## 2022-01-28 MED ORDER — PERFLUTREN LIPID MICROSPHERE
1.0000 mL | INTRAVENOUS | Status: DC | PRN
  Administered 2022-01-28: 3 mL via INTRAVENOUS
  Filled 2022-01-28: qty 10

## 2022-01-28 NOTE — Patient Instructions (Signed)
EKG done today.  Labs done today. We will contact you only if your labs are abnormal.  No medication changes were made. Please continue all current medications as prescribed.  Your physician recommends that you schedule a follow-up appointment in: 2 months with our NP/PA Clinic here in our office.   If you have any questions or concerns before your next appointment please send us a message through mychart or call our office at [phone].    TO LEAVE A MESSAGE FOR THE NURSE SELECT OPTION 2, PLEASE LEAVE A MESSAGE INCLUDING: YOUR NAME DATE OF BIRTH CALL BACK NUMBER REASON FOR CALL**this is important as we prioritize the call backs  YOU WILL RECEIVE A CALL BACK THE SAME DAY AS LONG AS YOU CALL BEFORE 4:00 PM   Do the following things EVERYDAY: Weigh yourself in the morning before breakfast. Write it down and keep it in a log. Take your medicines as prescribed Eat low salt foods--Limit salt (sodium) to 2000 mg per day.  Stay as active as you can everyday Limit all fluids for the day to less than 2 liters   At the [facility], you and your health needs are our priority. As part of our continuing mission to provide you with exceptional heart care, we have created designated Provider Care Teams. These Care Teams include your primary Cardiologist (physician) and Advanced Practice Providers (APPs- Physician Assistants and Nurse Practitioners) who all work together to provide you with the care you need, when you need it.   You may see any of the following providers on your designated Care Team at your next follow up: Dr Daniel Bensimhon Dr Dalton McLean Amy Clegg, NP Brittainy Simmons, PA Lauren Kemp, PharmD   Please be sure to bring in all your medications bottles to every appointment.   

## 2022-01-28 NOTE — Progress Notes (Signed)
?  Echocardiogram ?2D Echocardiogram has been performed. ? ?Delcie Roch ?01/28/2022, 10:21 AM ?

## 2022-01-30 DIAGNOSIS — J9611 Chronic respiratory failure with hypoxia: Secondary | ICD-10-CM | POA: Diagnosis not present

## 2022-01-30 DIAGNOSIS — I5032 Chronic diastolic (congestive) heart failure: Secondary | ICD-10-CM | POA: Diagnosis not present

## 2022-01-30 NOTE — Progress Notes (Signed)
?Advanced Heart Failure Clinic Note  ? ?PCP: [name], NP ?PCP-Cardiologist: Dorris Carnes, MD  ?[facility]: Dr. Aundra Dubin  ? ?HPI: ?Mr. Meckes is a 56 y.o. with hx of diastolic CHF, HTN, DM, Atrial fib, PE in April 2020 (on Xarelto), OSA, GERD, chronic elevation of troponin. ?   ?Admitted 01/21/2019 with increased dyspnea and had PE. He was anticoagulated.  ?  ?Patient re-admitted 5/20 for syncope/cough/dyspnea, COVID+, CT positive for bilateral GGOs, admitted to [facility] and discharged after 6 days, on O2.  Rec'd Cards f/u. ?  ?Patient re-admitted 02/20/19 for bright red blood per rectum, eval'ed by GI, had EGD/colon, dx'd with hemorrhoidal bleeding, discharged after 9 days on O2. He was again Covid + .  ? ?Readmitted 03/11/19 with increased shortness of breath. He did not have home oxygen. He was discharged to home the next day.  ?  ?He presented to [facility] ED 04/11/19 with lower extremity edema. COVID negative. Diuresed with lasix drip and transitioned to torsemide 40 mg twice daily. Had [place] with mild volume overload and preserved cardiac output. Discharge weight was 407.9 pounds.  ?  ?He was readmitted with symptomatic anemia 05/28/19 with hgb 7.1. Received 1UPRBCs. GI consulted. He started on anusol and continued on stool softner.  ?  ?He was readmitted to the hospital on 07/16/19 when he presented to the ED w/ worsening symptoms and was admitted by IM for a/c diastolic CHF and treated w/ IV Lasix. Had 2 day hospital stay and was discharged on 10/29. Echo was done 10/28 and showed normal LVEF 65-70%, RV interpreted as normal but suspect significant RV dysfunction.  After diuresis w/ IV Lasix, he was placed back on home diuretic regimen, torsemide 100 mg bid. Lisinopril 5 mg was also added to regimen. AHF team was not consulted that admit. ?  ?He was seen back in clinic 07/30/19. Felt poorly. SOB at rest and worse w/ exertion. Also w/ orthopnea/PND. Significant wt gain of 20 lb up to 428 lb (dry wt ~407 lb), w/ marked abdominal  distention and LEE and poor response to IV diuretics.  Once diuresed transitioned to torsemide 100 mg twice a day. D/C 408 pounds.  ? ?Zio patch on 10/20/19-No arrhythmia.  ? ?Re-admitted 08/19/20-08/24/20 for  A fib RVR. A. fib RVR in the setting of noncompliance ethanol and cocaine. Cardizem started and labetalol continued.  12/3 TEE (EF 60-65%) with DCCV successfully converted to SR. Diuresed with IV lasix and had AKI in setting of over diuresis. SCr 1.59 on admit, bumped to 2.5. Diuretics held + IVF hydration.  ? ?Admitted 12/24/20 with hypertensive emergency and respiratory failure. .  ? ?Admitted 4/22 with A Fib RVR & AMS. Diltiazem increased to 360 mg daily. Chemically converted to NSR.  + Cocaine.  ? ?Seen in ED 11/22 for COPD exacerbation. ? ?DC'd from paramedicine due to lack of communication. ? ?Follow up 1/23 he had not been seen since 5/22. He appeared volume overloaded and torsemide increased.  ? ?Seen 11/02/21 and had been off all meds x 1 week, volume overloaded. Meds restarted and instructed to take metolazone 2.5 x 2 days w/ extra 40 kcl, however labs showed elevated BUN/SCr and left message to take metolazone x 1 day.  Re-enrolled in paramedicine. ? ?Echo was done today and reviewed, EF 60-65%, moderate LVH, mid-cavity LV gradient to 29 mmHg, RV normal.  ? ?Today he returns for HF followup.  BP is high but he has not taken his am meds.  He does report  that he has been taking his meds as ordered.  No dyspnea walking on flat ground.  He gets short of breath walking up stairs.  No chest pain.  No orthopnea/PND.  No palpitations.  No lightheadedness. He uses home oxygen during the day and Bipap at night. Weight stable.  ? ?ECG (personally reviewed): NSR, nonspecific T wave changes.  ? ?Labs (11/20): BNP 32, K 3.6, creatinine 1.09  ?Labs (12/20): K 3.3, creatinine 1.28 ?Labs (10/10/19): K 3.9 Creatinine 1.4  ?Labs (11/05/19) : K 3.2 Creatinine 1.47  ?Labs (12/30/2019) : K 3.5 Creatinine 1.14  ?Labs (03/02/2020):  K 3.8 Creatinine 1.09.  ?Labs (12/21): K 4.0, Creatinine 1.17, Magnesium 1.6 ?Labs (09/15/20): K 3.1, Creatinine 2.54 ?Labs (1/22): K 3.1, creatinine 1.56 ?Labs (5/22): K 3.6, creatinine 1.89 ?Labs (1/23): K 3.9, creatinine 1.34 ?Labs (3/23): K 4.0, creatinine 1.29 => 1.77 ? ?Review of Systems: All systems reviewed and negative except as per HPI.  ? ?PMH: ?1. Atrial fibrillation: Paroxysmal.  ?- DCCV (08/21/20): converted to SR ?2. Pulmonary embolus: 5/20.  ?3. OHS/OSA: He is on home oxygen during the day and uses CPAP at night.  ?4. Morbid obesity.  ?5. Chronic diastolic CHF:  ?- RHC (7/42): mean RA 12, PA 40/25 mean 31, mean PCWP 23, CI 2.47, PVR 1.1 WU.  ?- Echo (10/20): EF 65-70%, mild LVH, normal RV size and systolic function.  ?- TEE (12/21): EF 60-65% ?- Cardiolite in 12/21: fixed apical defect is likely artifact, no ischemia.  ?- Echo (5/23): EF 60-65%, moderate LVH, mid-cavity LV gradient to 29 mmHg, RV normal.  ?6. Type 2 diabetes ?7. HTN ?8. Rectal bleeding: ?Hemorrhoidal.  ?9. COVID-19 infection 7/20.  ?10. ZIo Patch - 10/20/19 no arrhythmias ?11. CKD: Stage 3 ? ?Current Outpatient Medications  ?Medication Sig Dispense Refill  ? atorvastatin (LIPITOR) 40 MG tablet Take 1 tablet (40 mg total) by mouth at bedtime. 90 tablet 3  ? Blood Glucose Monitoring Suppl (TRUE METRIX METER) w/Device KIT Use as instructed. Check blood glucose level by fingerstick twice per day. 1 kit 0  ? Blood Pressure Monitor DEVI Please provide patient with insurance approved blood pressure device with L-XL cuff. BMI 55 1 each 0  ? carboxymethylcellulose (REFRESH PLUS) 0.5 % SOLN Place 1 drop into both eyes daily.    ? diltiazem (CARDIZEM CD) 360 MG 24 hr capsule Take 1 capsule (360 mg total) by mouth daily. 90 capsule 3  ? doxazosin (CARDURA) 2 MG tablet Take 1 tablet (2 mg total) by mouth daily. 30 tablet 11  ? ferrous sulfate (FEROSUL) 325 (65 FE) MG tablet TAKE 1 TABLET (325 MG TOTAL) BY MOUTH DAILY AT NOON. 90 tablet 0  ? glucose  blood (TRUE METRIX BLOOD GLUCOSE TEST) test strip Use as instructed. Check blood glucose level by fingerstick twice per day. 100 each 12  ? hydrALAZINE (APRESOLINE) 50 MG tablet Take 1 tablet (50 mg total) by mouth 3 (three) times daily. 180 tablet 3  ? losartan (COZAAR) 50 MG tablet Take 1 tablet (50 mg total) by mouth daily. 90 tablet 3  ? magnesium oxide (MAG-OX) 400 (240 Mg) MG tablet Take 1 tablet (400 mg total) by mouth 2 (two) times daily. 90 tablet 3  ? metFORMIN (GLUCOPHAGE) 500 MG tablet TAKE ONE TABLET BY MOUTH TWICE DAILY WITH A MEAL (NOON+BEDTIME) 180 tablet 0  ? metolazone (ZAROXOLYN) 2.5 MG tablet Take only as directed by CHF clinic. 10 tablet 0  ? Misc. Devices MISC Requires O2 @ 3L/min  continuously via nasal canula and home fill system,. 1 each 0  ? Misc. Devices MISC Please provide Mr. Demeo with insurance approved portable O2 concentrator ICD 10 J96.11 Z99.81 1 each 0  ? Misc. Devices MISC Please provide patient with insurance approved LARGE right hand splint ICD 10 G56.01 1 each 0  ? Misc. Devices MISC Please provide patient wit insurance approved diabetic custom molded shoes (QTY 2) and custom molded multi density inserts (1 for each shoe). 1 each 0  ? omeprazole (PRILOSEC) 20 MG capsule TAKE 1 CAPSULE 2 (TWO) TIMES DAILY BEFORE A MEAL. TAKE FIRST CAPSULE 30 MIN PRIOR TO EATING OR TAKING OTHER MEDICATIONS. (AM+BEDTIME) 180 capsule 1  ? potassium chloride (KLOR-CON) 10 MEQ tablet Take 8 tablets (80 mEq total) by mouth daily. 240 tablet 11  ? PROVENTIL HFA 108 (90 Base) MCG/ACT inhaler INHALE 2 PUFFS BY MOUTH EVERY 6 (SIX) HOURS AS NEEDED FOR WHEEZING OR SHORTNESS OF BREATH. 18 g 6  ? rivaroxaban (XARELTO) 20 MG TABS tablet Take 1 tablet (20 mg total) by mouth daily with supper. 90 tablet 3  ? spironolactone (ALDACTONE) 50 MG tablet Take 1 tablet (50 mg total) by mouth daily. 30 tablet 11  ? STOOL SOFTENER 100 MG capsule TAKE 1 CAPSULE BY MOUTH 2 (TWO) TIMES DAILY (NOON+BEDTIME) 60 capsule 0  ?  sulfamethoxazole-trimethoprim (BACTRIM DS) 800-160 MG tablet Take 1 tablet by mouth 2 (two) times daily. 14 tablet 0  ? tamsulosin (FLOMAX) 0.4 MG CAPS capsule Take 0.4 mg by mouth daily after supp

## 2022-02-03 DIAGNOSIS — F4311 Post-traumatic stress disorder, acute: Secondary | ICD-10-CM | POA: Diagnosis not present

## 2022-02-04 ENCOUNTER — Ambulatory Visit (INDEPENDENT_AMBULATORY_CARE_PROVIDER_SITE_OTHER): Payer: Medicaid Other

## 2022-02-04 ENCOUNTER — Ambulatory Visit: Payer: Medicaid Other | Admitting: Podiatry

## 2022-02-04 ENCOUNTER — Ambulatory Visit: Payer: Medicaid Other

## 2022-02-04 DIAGNOSIS — M19079 Primary osteoarthritis, unspecified ankle and foot: Secondary | ICD-10-CM

## 2022-02-04 DIAGNOSIS — R609 Edema, unspecified: Secondary | ICD-10-CM

## 2022-02-04 DIAGNOSIS — E1149 Type 2 diabetes mellitus with other diabetic neurological complication: Secondary | ICD-10-CM | POA: Diagnosis not present

## 2022-02-04 DIAGNOSIS — M2042 Other hammer toe(s) (acquired), left foot: Secondary | ICD-10-CM

## 2022-02-04 DIAGNOSIS — M2041 Other hammer toe(s) (acquired), right foot: Secondary | ICD-10-CM

## 2022-02-04 DIAGNOSIS — B351 Tinea unguium: Secondary | ICD-10-CM

## 2022-02-04 DIAGNOSIS — M19071 Primary osteoarthritis, right ankle and foot: Secondary | ICD-10-CM | POA: Diagnosis not present

## 2022-02-07 ENCOUNTER — Telehealth (HOSPITAL_COMMUNITY): Payer: Self-pay
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
## 2022-02-07 NOTE — Telephone Encounter (Signed)
Attempted to speak with Benjamin [name] to set up home visit for this week. Left voicemail and will continue to follow up.

## 2022-02-08 ENCOUNTER — Telehealth (HOSPITAL_COMMUNITY): Payer: Self-pay

## 2022-02-08 NOTE — Telephone Encounter (Signed)
Received call from [name] reporting they need a refill sent in for [name] Metformin.  I will route call to his PCP for same.    Jibreel also reports he is being seen by foot doctor for his diabetic shoes today and cannot meet. We planned for home visit on Monday morning. Call complete.

## 2022-02-09 ENCOUNTER — Other Ambulatory Visit: Payer: Self-pay | Admitting: Nurse Practitioner

## 2022-02-09 MED ORDER — METFORMIN HCL 500 MG PO TABS: 500.0000 mg | ORAL_TABLET | Freq: Two times a day (BID) | ORAL | 0 refills | Status: DC

## 2022-02-14 ENCOUNTER — Other Ambulatory Visit (HOSPITAL_COMMUNITY): Payer: Self-pay

## 2022-02-14 NOTE — Progress Notes (Signed)
Paramedicine Encounter    Patient ID: UNIQUE SEARFOSS, male    DOB: 1966/09/07, 56 y.o.   MRN: [id number]  Arrived for home visit for Benjamin [name] who reports feeling okay just having some knee pain which is chronic for him. He follows up with ortho on Wednesday. I wrote down appointment date, time and address for a reminder.   We reviewed medications and I confirmed bubble packs which are correct and updated with potassium included so he no longer longer takes them separately to reduce missed doses.    He reports he got an order of Furoscix in the mail. I instructed him to not use it unless ordered by HF clinic. He agreed.   He reports he has been following up with [facility] and was seen by them to have a procedure done a few weeks ago and he reports it helped his testicular pain and swelling and now he is peeing 2-3 times daily.   On assessment no lower leg swelling noted. Lungs clear on assessment. He has been med compliant.   He has not seen pulmonology and wants to set up an appointment with them. I will assist. [facility] last seen him in 2022 for a sleep study.   Vitals as noted.  CBG- 97   Refills- NONE  We reviewed all appointments and I wrote down same for reminders. He agreed with visit in two to three weeks. He understands to reach out if needed. Home visit complete.     Patient Care Team: [name], NP as PCP - General (Nurse Practitioner) [name], MD as PCP - Cardiology (Cardiology) [name], LCSW as Social Worker (Licensed Clinical Social Worker)  Patient Active Problem List   Diagnosis Date Noted   Atrial fibrillation with RVR ([place]) 01/08/2021   Acute respiratory failure ([place]) 12/24/2020   Obstructive sleep apnea treated with BiPAP 12/24/2020   Polysubstance abuse ([place]) 08/20/2020   Alcohol abuse 08/20/2020   Atrial fibrillation with rapid ventricular response ([place]) 08/19/2020   Obesity hypoventilation syndrome ([place]) 06/12/2020    Healthcare maintenance 06/12/2020   Acute on chronic diastolic heart failure ([place]) 02/24/2020   Diabetic neuropathy ([place]) 11/20/2019   Coagulation disorder ([place]) 11/20/2019   Acute on chronic diastolic CHF (congestive heart failure), NYHA class 3 ([place]) 07/30/2019   Diabetes mellitus without complication ([place]) 15/40/0867   Hypertensive emergency    BRBPR (bright red blood per rectum) 06/12/2019   Hypertension    CHF (congestive heart failure) (HCC)    Sleep apnea    Chronic respiratory failure ([place])    Hypokalemia 05/29/2019   GI bleed 05/29/2019   Anemia 05/28/2019   Iron deficiency anemia 03/11/2019   OSA on CPAP 03/11/2019   HLD (hyperlipidemia) 03/11/2019   Elevated troponin 03/11/2019   GERD (gastroesophageal reflux disease) 03/11/2019   Rectal bleeding 02/20/2019   Dyspnea 02/06/2019   COVID-19 virus infection 02/06/2019   Bilateral lower extremity edema    Morbid obesity with BMI of 50.0-59.9, adult (HCC)    PAF (paroxysmal atrial fibrillation) ([place])    PE (pulmonary thromboembolism) ([place]) 01/21/2019   Hypertensive urgency 01/21/2019   Diabetes mellitus type 2 in obese ([place]) 01/21/2019    Current Outpatient Medications:    atorvastatin (LIPITOR) 40 MG tablet, Take 1 tablet (40 mg total) by mouth at bedtime., Disp: 90 tablet, Rfl: 3   Blood Glucose Monitoring Suppl (TRUE METRIX METER) w/Device KIT, Use as instructed. Check blood glucose level by fingerstick twice per day., Disp:  1 kit, Rfl: 0   Blood Pressure Monitor DEVI, Please provide patient with insurance approved blood pressure device with L-XL cuff. BMI 55, Disp: 1 each, Rfl: 0   carboxymethylcellulose (REFRESH PLUS) 0.5 % SOLN, Place 1 drop into both eyes daily., Disp: , Rfl:    diltiazem (CARDIZEM CD) 360 MG 24 hr capsule, Take 1 capsule (360 mg total) by mouth daily., Disp: 90 capsule, Rfl: 3   doxazosin (CARDURA) 2 MG tablet, Take 1 tablet (2 mg total) by mouth daily., Disp: 30 tablet, Rfl: 11   ferrous sulfate  (FEROSUL) 325 (65 FE) MG tablet, TAKE 1 TABLET (325 MG TOTAL) BY MOUTH DAILY AT NOON., Disp: 90 tablet, Rfl: 0   glucose blood (TRUE METRIX BLOOD GLUCOSE TEST) test strip, Use as instructed. Check blood glucose level by fingerstick twice per day., Disp: 100 each, Rfl: 12   hydrALAZINE (APRESOLINE) 50 MG tablet, Take 1 tablet (50 mg total) by mouth 3 (three) times daily., Disp: 180 tablet, Rfl: 3   losartan (COZAAR) 50 MG tablet, Take 1 tablet (50 mg total) by mouth daily., Disp: 90 tablet, Rfl: 3   magnesium oxide (MAG-OX) 400 (240 Mg) MG tablet, Take 1 tablet (400 mg total) by mouth 2 (two) times daily., Disp: 90 tablet, Rfl: 3   metFORMIN (GLUCOPHAGE) 500 MG tablet, Take 1 tablet (500 mg total) by mouth 2 (two) times daily with a meal., Disp: 180 tablet, Rfl: 0   metolazone (ZAROXOLYN) 2.5 MG tablet, Take only as directed by CHF clinic., Disp: 10 tablet, Rfl: 0   Misc. Devices MISC, Requires O2 @ 3L/min continuously via nasal canula and home fill system,., Disp: 1 each, Rfl: 0   Misc. Devices MISC, Please provide Mr. Fendley with insurance approved portable O2 concentrator ICD 10 J96.11 Z99.81, Disp: 1 each, Rfl: 0   Misc. Devices MISC, Please provide patient with insurance approved LARGE right hand splint ICD 10 G56.01, Disp: 1 each, Rfl: 0   Misc. Devices MISC, Please provide patient wit insurance approved diabetic custom molded shoes (QTY 2) and custom molded multi density inserts (1 for each shoe)., Disp: 1 each, Rfl: 0   omeprazole (PRILOSEC) 20 MG capsule, TAKE 1 CAPSULE 2 (TWO) TIMES DAILY BEFORE A MEAL. TAKE FIRST CAPSULE 30 MIN PRIOR TO EATING OR TAKING OTHER MEDICATIONS. (AM+BEDTIME), Disp: 180 capsule, Rfl: 1   potassium chloride (KLOR-CON) 10 MEQ tablet, Take 8 tablets (80 mEq total) by mouth daily., Disp: 240 tablet, Rfl: 11   PROVENTIL HFA 108 (90 Base) MCG/ACT inhaler, INHALE 2 PUFFS BY MOUTH EVERY 6 (SIX) HOURS AS NEEDED FOR WHEEZING OR SHORTNESS OF BREATH., Disp: 18 g, Rfl: 6    rivaroxaban (XARELTO) 20 MG TABS tablet, Take 1 tablet (20 mg total) by mouth daily with supper., Disp: 90 tablet, Rfl: 3   spironolactone (ALDACTONE) 50 MG tablet, Take 1 tablet (50 mg total) by mouth daily., Disp: 30 tablet, Rfl: 11   STOOL SOFTENER 100 MG capsule, TAKE 1 CAPSULE BY MOUTH 2 (TWO) TIMES DAILY (NOON+BEDTIME), Disp: 60 capsule, Rfl: 0   sulfamethoxazole-trimethoprim (BACTRIM DS) 800-160 MG tablet, Take 1 tablet by mouth 2 (two) times daily., Disp: 14 tablet, Rfl: 0   tamsulosin (FLOMAX) 0.4 MG CAPS capsule, Take 0.4 mg by mouth daily after supper., Disp: , Rfl:    torsemide (DEMADEX) 20 MG tablet, Take 2 tablets (40 mg total) by mouth 2 (two) times daily., Disp: 60 tablet, Rfl: 11   TRUEplus Lancets 28G MISC, Use as instructed. Check blood  glucose level by fingerstick twice per day., Disp: 200 each, Rfl: 3 No Known Allergies   Social History   Socioeconomic History   Marital status: Single    Spouse name: Not on file   Number of children: Not on file   Years of education: Not on file   Highest education level: Not on file  Occupational History   Not on file  Tobacco Use   Smoking status: Former   Smokeless tobacco: Never   Tobacco comments:    smoked weed in the past  Vaping Use   Vaping Use: Never used  Substance and Sexual Activity   Alcohol use: Not Currently   Drug use: Yes    Types: Marijuana, Cocaine   Sexual activity: Not Currently  Other Topics Concern   Not on file  Social History Narrative   Not on file   Social Determinants of Health   Financial Resource Strain: High Risk   Difficulty of Paying Living Expenses: Hard  Food Insecurity: No Food Insecurity   Worried About Running Out of Food in the Last Year: Never true   Ran Out of Food in the Last Year: Never true  Transportation Needs: No Transportation Needs   Lack of Transportation (Medical): No   Lack of Transportation (Non-Medical): No  Physical Activity: Not on file  Stress: Not on file   Social Connections: Not on file  Intimate Partner Violence: Not on file    Physical Exam      Future Appointments  Date Time Provider [place]  02/16/2022  2:30 PM Meredith Pel, MD [facility] None  03/30/2022 10:30 AM [name], NP [facility] None  03/30/2022 12:00 PM [facility] PA/NP [facility] None  08/08/2022 11:15 AM Trula Slade, DPM [facility]     ACTION: Home visit completed   [name], EMT-Paramedic [phone] 02/14/2022

## 2022-02-16 ENCOUNTER — Other Ambulatory Visit: Payer: Self-pay

## 2022-02-16 ENCOUNTER — Encounter (HOSPITAL_COMMUNITY): Payer: Self-pay | Admitting: Emergency Medicine

## 2022-02-16 ENCOUNTER — Telehealth (HOSPITAL_COMMUNITY): Payer: Self-pay

## 2022-02-16 ENCOUNTER — Emergency Department (HOSPITAL_COMMUNITY): Payer: Medicaid Other

## 2022-02-16 ENCOUNTER — Ambulatory Visit: Payer: Medicaid Other | Admitting: Orthopedic Surgery

## 2022-02-16 ENCOUNTER — Emergency Department (HOSPITAL_COMMUNITY)
Admission: EM | Admit: 2022-02-16 | Discharge: 2022-02-16 | Disposition: A | Discharge status: Home / Self Care | Payer: Medicaid Other | Attending: Emergency Medicine | Admitting: Emergency Medicine

## 2022-02-16 DIAGNOSIS — Z7901 Long term (current) use of anticoagulants: Secondary | ICD-10-CM | POA: Insufficient documentation

## 2022-02-16 DIAGNOSIS — Z7982 Long term (current) use of aspirin: Secondary | ICD-10-CM | POA: Insufficient documentation

## 2022-02-16 DIAGNOSIS — R600 Localized edema: Secondary | ICD-10-CM | POA: Insufficient documentation

## 2022-02-16 DIAGNOSIS — R0609 Other forms of dyspnea: Secondary | ICD-10-CM | POA: Diagnosis not present

## 2022-02-16 DIAGNOSIS — Z79899 Other long term (current) drug therapy: Secondary | ICD-10-CM | POA: Diagnosis not present

## 2022-02-16 DIAGNOSIS — R079 Chest pain, unspecified: Secondary | ICD-10-CM

## 2022-02-16 DIAGNOSIS — R0789 Other chest pain: Secondary | ICD-10-CM | POA: Diagnosis not present

## 2022-02-16 DIAGNOSIS — R062 Wheezing: Secondary | ICD-10-CM | POA: Diagnosis not present

## 2022-02-16 DIAGNOSIS — R42 Dizziness and giddiness: Secondary | ICD-10-CM | POA: Diagnosis not present

## 2022-02-16 DIAGNOSIS — R0902 Hypoxemia: Secondary | ICD-10-CM | POA: Diagnosis not present

## 2022-02-16 DIAGNOSIS — I213 ST elevation (STEMI) myocardial infarction of unspecified site: Secondary | ICD-10-CM | POA: Diagnosis not present

## 2022-02-16 DIAGNOSIS — E119 Type 2 diabetes mellitus without complications: Secondary | ICD-10-CM | POA: Diagnosis not present

## 2022-02-16 DIAGNOSIS — I110 Hypertensive heart disease with heart failure: Secondary | ICD-10-CM | POA: Insufficient documentation

## 2022-02-16 DIAGNOSIS — Z7984 Long term (current) use of oral hypoglycemic drugs: Secondary | ICD-10-CM | POA: Insufficient documentation

## 2022-02-16 DIAGNOSIS — R0600 Dyspnea, unspecified: Secondary | ICD-10-CM | POA: Insufficient documentation

## 2022-02-16 DIAGNOSIS — I5030 Unspecified diastolic (congestive) heart failure: Secondary | ICD-10-CM | POA: Insufficient documentation

## 2022-02-16 DIAGNOSIS — R0602 Shortness of breath: Secondary | ICD-10-CM | POA: Diagnosis not present

## 2022-02-16 DIAGNOSIS — R0689 Other abnormalities of breathing: Secondary | ICD-10-CM | POA: Diagnosis not present

## 2022-02-16 DIAGNOSIS — I10 Essential (primary) hypertension: Secondary | ICD-10-CM | POA: Diagnosis not present

## 2022-02-16 LAB — COMPREHENSIVE METABOLIC PANEL
ALT: 15 U/L (ref 0–44)
AST: 17 U/L (ref 15–41)
Albumin: 3.5 g/dL (ref 3.5–5.0)
Alkaline Phosphatase: 46 U/L (ref 38–126)
Anion gap: 6 (ref 5–15)
BUN: 14 mg/dL (ref 6–20)
CO2: 28 mmol/L (ref 22–32)
Calcium: 9.2 mg/dL (ref 8.9–10.3)
Chloride: 103 mmol/L (ref 98–111)
Creatinine, Ser: 1.47 mg/dL — ABNORMAL HIGH (ref 0.61–1.24)
GFR, Estimated: 56 mL/min — ABNORMAL LOW (ref >60)
Glucose, Bld: 97 mg/dL (ref 70–99)
Potassium: 4.3 mmol/L (ref 3.5–5.1)
Sodium: 137 mmol/L (ref 135–145)
Total Bilirubin: 0.5 mg/dL (ref 0.3–1.2)
Total Protein: 7.5 g/dL (ref 6.5–8.1)

## 2022-02-16 LAB — CBC WITH DIFFERENTIAL/PLATELET
Abs Immature Granulocytes: 0.01 10*3/uL (ref 0.00–0.07)
Basophils Absolute: 0 10*3/uL (ref 0.0–0.1)
Basophils Relative: 0 %
Eosinophils Absolute: 0.2 10*3/uL (ref 0.0–0.5)
Eosinophils Relative: 2 %
HCT: 45.3 % (ref 39.0–52.0)
Hemoglobin: 13.7 g/dL (ref 13.0–17.0)
Immature Granulocytes: 0 %
Lymphocytes Relative: 31 %
Lymphs Abs: 2.3 10*3/uL (ref 0.7–4.0)
MCH: 24.6 pg — ABNORMAL LOW (ref 26.0–34.0)
MCHC: 30.2 g/dL (ref 30.0–36.0)
MCV: 81.5 fL (ref 80.0–100.0)
Monocytes Absolute: 0.6 10*3/uL (ref 0.1–1.0)
Monocytes Relative: 8 %
Neutro Abs: 4.4 10*3/uL (ref 1.7–7.7)
Neutrophils Relative %: 59 %
Platelets: 394 10*3/uL (ref 150–400)
RBC: 5.56 MIL/uL (ref 4.22–5.81)
RDW: 15.8 % — ABNORMAL HIGH (ref 11.5–15.5)
WBC: 7.4 10*3/uL (ref 4.0–10.5)
nRBC: 0 % (ref 0.0–0.2)

## 2022-02-16 LAB — LIPID PANEL
Cholesterol: 117 mg/dL (ref 0–200)
HDL: 44 mg/dL (ref >40)
LDL Cholesterol: 65 mg/dL (ref 0–99)
Total CHOL/HDL Ratio: 2.7 RATIO
Triglycerides: 40 mg/dL (ref <150)
VLDL: 8 mg/dL (ref 0–40)

## 2022-02-16 LAB — TROPONIN I (HIGH SENSITIVITY)
Troponin I (High Sensitivity): 22 ng/L — ABNORMAL HIGH (ref <18)
Troponin I (High Sensitivity): 22 ng/L — ABNORMAL HIGH (ref <18)

## 2022-02-16 LAB — PROTIME-INR
INR: 1 (ref 0.8–1.2)
Prothrombin Time: 12.9 seconds (ref 11.4–15.2)

## 2022-02-16 LAB — HEMOGLOBIN A1C
Hgb A1c MFr Bld: 6.7 % — ABNORMAL HIGH (ref 4.8–5.6)
Mean Plasma Glucose: 145.59 mg/dL

## 2022-02-16 LAB — BRAIN NATRIURETIC PEPTIDE: B Natriuretic Peptide: 17.4 pg/mL (ref 0.0–100.0)

## 2022-02-16 LAB — APTT: aPTT: 30 seconds (ref 24–36)

## 2022-02-16 MED ORDER — IPRATROPIUM-ALBUTEROL 0.5-2.5 (3) MG/3ML IN SOLN
3.0000 mL | Freq: Once | RESPIRATORY_TRACT | Status: AC
  Administered 2022-02-16: 3 mL via RESPIRATORY_TRACT
  Filled 2022-02-16: qty 3

## 2022-02-16 NOTE — Discharge Instructions (Signed)
You have requested to be discharged before the rest of your labs are back, what we have seen so far does not show that you are having any heart attack however we know that you do have heart disease and lung disease, you will need to follow-up closely with your doctor, within 24 hours however if you change your mind and want to come back to the emergency department for further evaluation and stabilizing care you are welcome to return at any moment and I encourage you to do so.  Sure you are taking all your daily medications exactly as prescribed by your doctor.  Please do not buy pain medications from other people off the street as you do not know what you are getting in these medications could be life-threatening to

## 2022-02-16 NOTE — ED Triage Notes (Signed)
Per [facility] pt was walking to bus stop when became extremely short of breath and collapsed. Usually on 2-3L [place] at home. Given 324 aspirin, 2 nitro, 10 mg albuterol and 0.5atrovent. EMS report some elevation in EKG, EDP paged cardiology and per charge cardiology did not activate stemi. Patient A&0x4 and speaking in full sentences with some dyspnea.

## 2022-02-16 NOTE — ED Notes (Signed)
Patient stating he cannot breath however making multiple phone calls. RN requested patient to stop making phone calls and to focus on breathing at this point. Duoneb started.

## 2022-02-16 NOTE — Telephone Encounter (Signed)
Benjamin [name] called me around 0930 reporting he was in the back of [facility] unit being evaluated for shortness of breath and dizziness. He [place] and became dizzy and short of breath resulting in calling EMS. They report they are carrying out ALS measures and transporting him to [facility] due to continued shortness of breath despite breathing treatments and noted EKG abnormalities. I left VM with AHF triage to notify Dr. Shirlee Latch and I will follow up. Call complete.

## 2022-02-16 NOTE — ED Provider Notes (Signed)
[facility] EMERGENCY DEPARTMENT Provider Note   CSN: [id number] Arrival date & time: 02/16/22  1009     History  Chief Complaint  Patient presents with   Chest Pain   Shortness of Breath    CAMERYN CHRISLEY is a 56 y.o. male.   Chest Pain Associated symptoms: shortness of breath   Shortness of Breath Associated symptoms: chest pain    This patient is an ill-appearing 57 year old male known history of congestive heart failure, has an ejection fraction of 60 to 65% with grade 1 diastolic dysfunction.  Also noted to have pulmonary venous hypertension.  The echocardiogram was most recently performed on Jan 28, 2022.  The patient reports to me that he was in his usual state of health this morning, he has been having pain in his leg and was on his way to catch the bus, he was supposed to have surgery on his leg this afternoon, as he approached the bus and started to pull away so he started to run to try to keep up with it became acutely dyspneic and started having chest discomfort.  Paramedics were called to the scene to find the patient to be dyspneic with diminished lung sounds, and abnormal EKG which showed ST elevations and they called and activated a STEMI prehospital.  He was given 325 mg of aspirin and 2 nitroglycerin as well as 10 mg of albuterol, the patient still has shortness of breath.  No significant chest pain at this time.  The patient also reports to me that this morning he bought 2 pain pills off the street 1 which she was told was Tylenol and the other which was Percocet  Home Medications Prior to Admission medications   Medication Sig Start Date End Date Taking? Authorizing Provider  atorvastatin (LIPITOR) 40 MG tablet Take 1 tablet (40 mg total) by mouth at bedtime. 11/02/21   Rafael Bihari, FNP  Blood Glucose Monitoring Suppl (TRUE METRIX METER) w/Device KIT Use as instructed. Check blood glucose level by fingerstick twice per day. 03/18/19    [name], NP  Blood Pressure Monitor DEVI Please provide patient with insurance approved blood pressure device with L-XL cuff. BMI 55 10/04/19   [name], NP  carboxymethylcellulose (REFRESH PLUS) 0.5 % SOLN Place 1 drop into both eyes daily.    [provider]  diltiazem (CARDIZEM CD) 360 MG 24 hr capsule Take 1 capsule (360 mg total) by mouth daily. 11/02/21   [place], Maricela Bo, FNP  doxazosin (CARDURA) 2 MG tablet Take 1 tablet (2 mg total) by mouth daily. Patient not taking: Reported on 02/14/2022 11/02/21   Rafael Bihari, FNP  ferrous sulfate (FEROSUL) 325 (65 FE) MG tablet TAKE 1 TABLET (325 MG TOTAL) BY MOUTH DAILY AT NOON. 12/30/21   Charlott Rakes, MD  glucose blood (TRUE METRIX BLOOD GLUCOSE TEST) test strip Use as instructed. Check blood glucose level by fingerstick twice per day. 09/22/20   [name], NP  hydrALAZINE (APRESOLINE) 50 MG tablet Take 1 tablet (50 mg total) by mouth 3 (three) times daily. 11/02/21   [place]ford, Maricela Bo, FNP  losartan (COZAAR) 50 MG tablet Take 1 tablet (50 mg total) by mouth daily. 11/02/21   Milford, Maricela Bo, FNP  magnesium oxide (MAG-OX) 400 (240 Mg) MG tablet Take 1 tablet (400 mg total) by mouth 2 (two) times daily. 11/02/21   Rafael Bihari, FNP  metFORMIN (GLUCOPHAGE) 500 MG tablet Take 1 tablet (500 mg  total) by mouth 2 (two) times daily with a meal. 02/09/22   [name], NP  metolazone (ZAROXOLYN) 2.5 MG tablet Take only as directed by CHF clinic. 11/29/21   Rafael Bihari, FNP  Misc. Devices MISC Requires O2 @ 3L/min continuously via nasal canula and home fill system,. 07/03/19   [name], NP  Misc. Devices MISC Please provide Mr. Inge with insurance approved portable O2 concentrator ICD 10 J96.11 Z99.81 08/27/19   [name], NP  Misc. Devices MISC Please provide patient with insurance approved LARGE right hand splint ICD 10 G56.01 11/16/20   [name], NP  Misc. Devices MISC Please  provide patient wit insurance approved diabetic custom molded shoes (QTY 2) and custom molded multi density inserts (1 for each shoe). 01/10/21   [name], NP  omeprazole (PRILOSEC) 20 MG capsule TAKE 1 CAPSULE 2 (TWO) TIMES DAILY BEFORE A MEAL. TAKE FIRST CAPSULE 30 MIN PRIOR TO EATING OR TAKING OTHER MEDICATIONS. (AM+BEDTIME) 06/14/21   [name], NP  potassium chloride (KLOR-CON) 10 MEQ tablet Take 8 tablets (80 mEq total) by mouth daily. 11/02/21   [place], Maricela Bo, FNP  PROVENTIL HFA 108 (90 Base) MCG/ACT inhaler INHALE 2 PUFFS BY MOUTH EVERY 6 (SIX) HOURS AS NEEDED FOR WHEEZING OR SHORTNESS OF BREATH. 09/22/20   [name], NP  rivaroxaban (XARELTO) 20 MG TABS tablet Take 1 tablet (20 mg total) by mouth daily with supper. 11/02/21   Rafael Bihari, FNP  spironolactone (ALDACTONE) 50 MG tablet Take 1 tablet (50 mg total) by mouth daily. 11/02/21   [place]ford, Maricela Bo, FNP  STOOL SOFTENER 100 MG capsule TAKE 1 CAPSULE BY MOUTH 2 (TWO) TIMES DAILY (NOON+BEDTIME) 06/14/21   [name], NP  sulfamethoxazole-trimethoprim (BACTRIM DS) 800-160 MG tablet Take 1 tablet by mouth 2 (two) times daily. 12/14/21   Rafael Bihari, FNP  tamsulosin (FLOMAX) 0.4 MG CAPS capsule Take 0.4 mg by mouth daily after supper.    [provider]  torsemide (DEMADEX) 20 MG tablet Take 2 tablets (40 mg total) by mouth 2 (two) times daily. 11/02/21   Milford, Maricela Bo, FNP  TRUEplus Lancets 28G MISC Use as instructed. Check blood glucose level by fingerstick twice per day. 09/22/20   [name], NP      Allergies    Patient has no known allergies.    Review of Systems   Review of Systems  Respiratory:  Positive for shortness of breath.   Cardiovascular:  Positive for chest pain.  All other systems reviewed and are negative.  Physical Exam Updated Vital Signs BP (!) 201/123   Pulse 74   Temp 98.8 F (37.1 C) (Oral)   Resp 16   Ht 1.803 m (_0 )   Wt (!) 165.6 kg   SpO2  100%   BMI 50.91 kg/m  Physical Exam Vitals and nursing note reviewed.  Constitutional:      General: He is not in acute distress.    Appearance: He is well-developed.  HENT:     Head: Normocephalic and atraumatic.     Mouth/Throat:     Mouth: Mucous membranes are moist.     Pharynx: No oropharyngeal exudate.  Eyes:     General: No scleral icterus.       Right eye: No discharge.        Left eye: No discharge.     Conjunctiva/sclera: Conjunctivae normal.     Pupils: Pupils are  equal, round, and reactive to light.  Neck:     Thyroid: No thyromegaly.     Vascular: No JVD.  Cardiovascular:     Rate and Rhythm: Normal rate and regular rhythm.     Heart sounds: Normal heart sounds. No murmur heard.   No friction rub. No gallop.  Pulmonary:     Effort: Respiratory distress present.     Breath sounds: Wheezing present. No rales.  Abdominal:     General: Bowel sounds are normal. There is no distension.     Palpations: Abdomen is soft. There is no mass.     Tenderness: There is no abdominal tenderness.  Musculoskeletal:        General: No tenderness. Normal range of motion.     Cervical back: Normal range of motion and neck supple.     Right lower leg: Edema present.     Left lower leg: Edema present.  Lymphadenopathy:     Cervical: No cervical adenopathy.  Skin:    General: Skin is warm and dry.     Findings: No erythema or rash.  Neurological:     General: No focal deficit present.     Mental Status: He is alert.     Coordination: Coordination normal.  Psychiatric:        Behavior: Behavior normal.    ED Results / Procedures / Treatments   Labs (all labs ordered are listed, but only abnormal results are displayed) Labs Reviewed  HEMOGLOBIN A1C - Abnormal; Notable for the following components:      Result Value   Hgb A1c MFr Bld 6.7 (*)    All other components within normal limits  CBC WITH DIFFERENTIAL/PLATELET - Abnormal; Notable for the following components:    MCH 24.6 (*)    RDW 15.8 (*)    All other components within normal limits  COMPREHENSIVE METABOLIC PANEL - Abnormal; Notable for the following components:   Creatinine, Ser 1.47 (*)    GFR, Estimated 56 (*)    All other components within normal limits  TROPONIN I (HIGH SENSITIVITY) - Abnormal; Notable for the following components:   Troponin I (High Sensitivity) 22 (*)    All other components within normal limits  PROTIME-INR  APTT  LIPID PANEL  BRAIN NATRIURETIC PEPTIDE  TROPONIN I (HIGH SENSITIVITY)    EKG EKG Interpretation  Date/Time:  Wednesday Feb 16 2022 10:13:11 EDT Ventricular Rate:  94 PR Interval:  143 QRS Duration: 91 QT Interval:  357 QTC Calculation: 447 R Axis:   77 Text Interpretation: Sinus rhythm Borderline ST elevation, anterolateral leads similar to multiple prior EKGs Confirmed by Noemi Chapel [id number]) on 02/16/2022 10:25:04 AM  Radiology DG Chest Port 1 View  Result Date: 02/16/2022 CLINICAL DATA:  Provided history: Shortness of breath. EXAM: PORTABLE CHEST 1 VIEW COMPARISON:  Prior chest radiographs 07/20/2021 and earlier. FINDINGS: Mild cardiomegaly, unchanged. Aortic atherosclerosis. Mild ill-defined opacity within the right lung base, with an appearance most suggestive of atelectasis. No evidence of pleural effusion or pneumothorax. No acute bony abnormality identified. IMPRESSION: Cardiomegaly. Mild ill-defined opacity within right lung base, favored to reflect atelectasis. Aortic Atherosclerosis (ICD10-I70.0). Electronically Signed   By: Kellie Simmering D.O.   On: 02/16/2022 12:06   DG Foot Complete Right  Result Date: 02/15/2022 Please see detailed radiograph report in office note.   Procedures Procedures    Medications Ordered in ED Medications  ipratropium-albuterol (DUONEB) 0.5-2.5 (3) MG/3ML nebulizer solution 3 mL (3 mLs Nebulization Given 02/16/22 1035)  ED Course/ Medical Decision Making/ A&P                           Medical Decision  Making Amount and/or Complexity of Data Reviewed Labs: ordered. Radiology: ordered.  Risk Prescription drug management.   This patient presents to the ED for concern of chest pain and SOB, this involves an extensive number of treatment options, and is a complaint that carries with it a high risk of complications and morbidity.  The differential diagnosis includes CHF exacerbation, COPD, would also consider acute coronary syndrome with obstructive disease   Co morbidities that complicate the patient evaluation  Congestive heart failure Lung disease Hypertension Diabetes Anticoagulated on Xarelto   Additional history obtained:  Additional history obtained from electronic medical record as well as the [facility] External records from outside source obtained and reviewed including prior echocardiogram from May 12, see notes in HPI   Lab Tests:  I Ordered, and personally interpreted labs.  The pertinent results include:  cbc, bmp, trop, no acute findings other than mildly elevated trop - has similar in the past - baseline.   Imaging Studies ordered:  I ordered imaging studies including chest xray  I independently visualized and interpreted imaging which showed atelectasis - no other acute fidnings I agree with the radiologist interpretation   Cardiac Monitoring: / EKG:  The patient was maintained on a cardiac monitor.  I personally viewed and interpreted the cardiac monitored which showed an underlying rhythm of: Borderline sinus tachycardia, ST abnormalities consistent with prior EKGs including ST elevation throughout the precordium   Consultations Obtained:  STEMI was activated prehospital but cardiology has stated they do not think this is a STEMI and can be worked up medically  Pt left AMA without seeing second troponin.   Problem List / ED Course / Critical interventions / Medication management  The patient had the initial work-up, he got treatments for his  breathing but ultimately wanted to leave, he knows that he is leaving [place].  He is aware that this may cause life or limb threatening injury but does not care and wants to go home, he has medical decision-making capacity.   Social Determinants of Health:  The patient has access to care   Test / Admission - Considered:  Considered admission but patient refuses wants to go home         Final Clinical Impression(s) / ED Diagnoses Final diagnoses:  Dyspnea on exertion  Chest pain, unspecified type    Rx / DC Orders ED Discharge Orders     None         Noemi Chapel, MD 02/16/22 1332

## 2022-02-16 NOTE — Telephone Encounter (Signed)
Spoke to [employer] asking why he left AMA from ED, he reports they were trying to out him on "breathing machine" and he says he has one at home and didn't need it so he left. I advised Benjamin [name] it was in best interest to stay in the ER until all labs, results and care was carried out. He verbalized understanding. He also stated he was not going to take any more percocet's. I advised him the importance of not using any medications outside of what he is prescribed. He verbalized understanding.  I reminded him that he did not make it to his Ortho apt today and we would need to reschedule this. He agreed to a home visit next week. I will follow up. Call complete.   [name], [place] 02/16/2022

## 2022-02-17 ENCOUNTER — Telehealth (HOSPITAL_COMMUNITY): Payer: Self-pay | Admitting: Adult Health

## 2022-02-17 ENCOUNTER — Telehealth (HOSPITAL_COMMUNITY): Payer: Self-pay

## 2022-02-17 NOTE — Telephone Encounter (Signed)
   Called by Herbert Seta with HF Paramedicine.   Reports Benjamin [name] SOB with exertion and lower extremity edema.    Instructed to hold pm torsemide.   Take [place] lasix today with extra 40 meq potassium.    She will go see him later today and discuss with patient.   Brendolyn Stockley NP-C  2:44 PM

## 2022-02-17 NOTE — Telephone Encounter (Signed)
Spoke to Benjamin [name] and attempted to set up home visit but he advised he is out of town and wont be back until late tonight. I advised him that it would be best if I assisted with [place] Lasix and med changes for today but he insisted he would be able to take care of it and reports he has extra potassium in a bottle at home and understood to take 4 extra tonight and to hold his PM dose of Torsemide in which he confirmed is "the white pill 917". I confirmed that was correct and if he needed assistance to call me. He has done [place] lasix before and knows how to use- I confirmed this with K. Lynch his previous paramedic. He agreed with plan. I will follow up on Monday in the home. Call complete.

## 2022-02-23 ENCOUNTER — Other Ambulatory Visit (HOSPITAL_COMMUNITY): Payer: Self-pay

## 2022-02-23 ENCOUNTER — Telehealth (HOSPITAL_COMMUNITY): Payer: Self-pay

## 2022-02-23 ENCOUNTER — Other Ambulatory Visit: Payer: Self-pay | Admitting: Nurse Practitioner

## 2022-02-23 ENCOUNTER — Telehealth (HOSPITAL_COMMUNITY): Payer: Self-pay | Admitting: Licensed Clinical Social Worker

## 2022-02-23 DIAGNOSIS — J9611 Chronic respiratory failure with hypoxia: Secondary | ICD-10-CM

## 2022-02-23 MED ORDER — PROVENTIL HFA 108 (90 BASE) MCG/ACT IN AERS: INHALATION_SPRAY | RESPIRATORY_TRACT | 6 refills | Status: DC

## 2022-02-23 MED ORDER — ALBUTEROL SULFATE (2.5 MG/3ML) 0.083% IN NEBU: 2.5000 mg | INHALATION_SOLUTION | Freq: Four times a day (QID) | RESPIRATORY_TRACT | 1 refills | Status: DC | PRN

## 2022-02-23 NOTE — Telephone Encounter (Signed)
Last week Benjamin [name] had called to get approval from [facility] in HF clinic for use of Benjamin [name] Lasix at home. She approved him to do one dose and to hold torsemide for evening and adding extra potassium. He refused home visit follow up from me that day as he was out of town but he reports he followed these instructions but, states he took two of the  Lasix doses one on Thursday and one on Friday. He states he held his torsemide for both days and added extra potassium. He reports he has urinated an abundance and states at first his urine was dark but now he reports his urine is light and he has no trouble urinating. I noted he had no edema noted and lungs were clear. I will suggest labs due to taking two  lasix back to back. I will send message to HF clinic PA Benjamin [name].   -Home visit completed today- outreach documented. Vitals and assessment normal.   -I will be out of office 6/8 and 6/9 please reach out to patient directly for lab follow up or I will coordinate on Monday 6/12.     Benjamin [name], EMT-Paramedic [phone] 02/23/2022

## 2022-02-23 NOTE — Telephone Encounter (Signed)
Spoke to PCP and confirmed patient has no record of being prescribed Combivent outpatient but does have record of Proventil. She confirmed she will be sending in Proventil to [name]. I will make patient aware. Grateful for PCP follow up.   Maralyn Sago, EMT-Paramedic [phone] 02/23/2022

## 2022-02-23 NOTE — Telephone Encounter (Signed)
I ordered nebs and inhaler. Not sure about combivent. I did see he ED had diagnosed him with COPD from an xray a while back but nothing in pulmonology notes to confirm this. Thanks for all your help!!

## 2022-02-23 NOTE — [place] Notes (Signed)
Paramedicine Encounter    Patient ID: Benjamin [name], male    DOB: 08-31-1966, 56 y.o.   MRN: [id number]   Arrived for home visit for [name]. He reports feeling good stating that he has been breathing better and peeing better. He reports that he has been compliant with his medications. He is on bubble packs through [name]. I confirmed dosing and that he has been compliant.   He reports needing a refill for inhaler. [name] reports they do not have record of any inhalers. I will message PCP.   I was able to schedule him a pulmonary follow up. I will schedule an ortho follow up as well.   CBG- 120  I plan to see him in two weeks.   SOCIAL: He reports he is behind on his rent and his account is overdrawn >2000 and doesn't know how. He spoke to Benjamin [name] and she is working on assistance.  UPDATEEliezer [name] approved [name] to avoid eviction.   Home visit complete.   Benjamin [name], [place] 02/23/2022      Patient Care Team: Benjamin Pounds, NP as PCP - General (Nurse Practitioner) Benjamin Records, MD as PCP - Cardiology (Cardiology) Benjamin Ny, LCSW as Social Worker (Licensed Clinical Social Worker)  Patient Active Problem List   Diagnosis Date Noted   Atrial fibrillation with RVR ([place]) 01/08/2021   Acute respiratory failure ([place]) 12/24/2020   Obstructive sleep apnea treated with BiPAP 12/24/2020   Polysubstance abuse ([place]) 08/20/2020   Alcohol abuse 08/20/2020   Atrial fibrillation with rapid ventricular response ([place]) 08/19/2020   Obesity hypoventilation syndrome ([place]) 06/12/2020   Healthcare maintenance 06/12/2020   Acute on chronic diastolic heart failure ([place]) 02/24/2020   Diabetic neuropathy ([place]) 11/20/2019   Coagulation disorder ([place]) 11/20/2019   Acute on chronic diastolic CHF (congestive heart failure), NYHA class 3 ([place]) 07/30/2019   Diabetes mellitus without complication ([place]) 78/29/5621   Hypertensive emergency    BRBPR (bright red  blood per rectum) 06/12/2019   Hypertension    CHF (congestive heart failure) (HCC)    Sleep apnea    Chronic respiratory failure ([place])    Hypokalemia 05/29/2019   GI bleed 05/29/2019   Anemia 05/28/2019   Iron deficiency anemia 03/11/2019   OSA on CPAP 03/11/2019   HLD (hyperlipidemia) 03/11/2019   Elevated troponin 03/11/2019   GERD (gastroesophageal reflux disease) 03/11/2019   Rectal bleeding 02/20/2019   Dyspnea 02/06/2019   COVID-19 virus infection 02/06/2019   Bilateral lower extremity edema    Morbid obesity with BMI of 50.0-59.9, adult (HCC)    PAF (paroxysmal atrial fibrillation) ([place])    PE (pulmonary thromboembolism) ([place]) 01/21/2019   Hypertensive urgency 01/21/2019   Diabetes mellitus type 2 in obese ([place]) 01/21/2019    Current Outpatient Medications:    atorvastatin (LIPITOR) 40 MG tablet, Take 1 tablet (40 mg total) by mouth at bedtime., Disp: 90 tablet, Rfl: 3   Blood Glucose Monitoring Suppl (TRUE METRIX METER) w/Device KIT, Use as instructed. Check blood glucose level by fingerstick twice per day., Disp: 1 kit, Rfl: 0   Blood Pressure Monitor DEVI, Please provide patient with insurance approved blood pressure device with L-XL cuff. BMI 55, Disp: 1 each, Rfl: 0   carboxymethylcellulose (REFRESH PLUS) 0.5 % SOLN, Place 1 drop into both eyes daily., Disp: , Rfl:    diltiazem (CARDIZEM CD) 360 MG 24 hr capsule, Take 1 capsule (360 mg total) by mouth daily., Disp: 90 capsule, Rfl: 3  doxazosin (CARDURA) 2 MG tablet, Take 1 tablet (2 mg total) by mouth daily. (Patient not taking: Reported on 02/14/2022), Disp: 30 tablet, Rfl: 11   ferrous sulfate (FEROSUL) 325 (65 FE) MG tablet, TAKE 1 TABLET (325 MG TOTAL) BY MOUTH DAILY AT NOON., Disp: 90 tablet, Rfl: 0   glucose blood (TRUE METRIX BLOOD GLUCOSE TEST) test strip, Use as instructed. Check blood glucose level by fingerstick twice per day., Disp: 100 each, Rfl: 12   hydrALAZINE (APRESOLINE) 50 MG tablet, Take 1 tablet (50  mg total) by mouth 3 (three) times daily., Disp: 180 tablet, Rfl: 3   losartan (COZAAR) 50 MG tablet, Take 1 tablet (50 mg total) by mouth daily., Disp: 90 tablet, Rfl: 3   magnesium oxide (MAG-OX) 400 (240 Mg) MG tablet, Take 1 tablet (400 mg total) by mouth 2 (two) times daily., Disp: 90 tablet, Rfl: 3   metFORMIN (GLUCOPHAGE) 500 MG tablet, Take 1 tablet (500 mg total) by mouth 2 (two) times daily with a meal., Disp: 180 tablet, Rfl: 0   metolazone (ZAROXOLYN) 2.5 MG tablet, Take only as directed by CHF clinic., Disp: 10 tablet, Rfl: 0   Misc. Devices MISC, Requires O2 @ 3L/min continuously via nasal canula and home fill system,., Disp: 1 each, Rfl: 0   Misc. Devices MISC, Please provide Benjamin [name] with insurance approved portable O2 concentrator ICD 10 J96.11 Z99.81, Disp: 1 each, Rfl: 0   Misc. Devices MISC, Please provide patient with insurance approved LARGE right hand splint ICD 10 G56.01, Disp: 1 each, Rfl: 0   Misc. Devices MISC, Please provide patient wit insurance approved diabetic custom molded shoes (QTY 2) and custom molded multi density inserts (1 for each shoe)., Disp: 1 each, Rfl: 0   omeprazole (PRILOSEC) 20 MG capsule, TAKE 1 CAPSULE 2 (TWO) TIMES DAILY BEFORE A MEAL. TAKE FIRST CAPSULE 30 MIN PRIOR TO EATING OR TAKING OTHER MEDICATIONS. (AM+BEDTIME), Disp: 180 capsule, Rfl: 1   potassium chloride (KLOR-CON) 10 MEQ tablet, Take 8 tablets (80 mEq total) by mouth daily., Disp: 240 tablet, Rfl: 11   PROVENTIL HFA 108 (90 Base) MCG/ACT inhaler, INHALE 2 PUFFS BY MOUTH EVERY 6 (SIX) HOURS AS NEEDED FOR WHEEZING OR SHORTNESS OF BREATH., Disp: 18 g, Rfl: 6   rivaroxaban (XARELTO) 20 MG TABS tablet, Take 1 tablet (20 mg total) by mouth daily with supper., Disp: 90 tablet, Rfl: 3   spironolactone (ALDACTONE) 50 MG tablet, Take 1 tablet (50 mg total) by mouth daily., Disp: 30 tablet, Rfl: 11   STOOL SOFTENER 100 MG capsule, TAKE 1 CAPSULE BY MOUTH 2 (TWO) TIMES DAILY (NOON+BEDTIME), Disp:  60 capsule, Rfl: 0   sulfamethoxazole-trimethoprim (BACTRIM DS) 800-160 MG tablet, Take 1 tablet by mouth 2 (two) times daily., Disp: 14 tablet, Rfl: 0   tamsulosin (FLOMAX) 0.4 MG CAPS capsule, Take 0.4 mg by mouth daily after supper., Disp: , Rfl:    torsemide (DEMADEX) 20 MG tablet, Take 2 tablets (40 mg total) by mouth 2 (two) times daily., Disp: 60 tablet, Rfl: 11   TRUEplus Lancets 28G MISC, Use as instructed. Check blood glucose level by fingerstick twice per day., Disp: 200 each, Rfl: 3 No Known Allergies   Social History   Socioeconomic History   Marital status: Single    Spouse name: Not on file   Number of children: Not on file   Years of education: Not on file   Highest education level: Not on file  Occupational History   Not on file  Tobacco Use   Smoking status: Former   Smokeless tobacco: Never   Tobacco comments:    smoked weed in the past  Vaping Use   Vaping Use: Never used  Substance and Sexual Activity   Alcohol use: Not Currently   Drug use: Yes    Types: Marijuana, Cocaine   Sexual activity: Not Currently  Other Topics Concern   Not on file  Social History Narrative   Not on file   Social Determinants of Health   Financial Resource Strain: High Risk   Difficulty of Paying Living Expenses: Hard  Food Insecurity: No Food Insecurity   Worried About Running Out of Food in the Last Year: Never true   Ran Out of Food in the Last Year: Never true  Transportation Needs: No Transportation Needs   Lack of Transportation (Medical): No   Lack of Transportation (Non-Medical): No  Physical Activity: Not on file  Stress: Not on file  Social Connections: Not on file  Intimate Partner Violence: Not on file    Physical Exam      Future Appointments  Date Time Provider [place]  03/30/2022 10:30 AM Benjamin Pounds, NP [facility] None  03/30/2022 12:00 PM [facility] PA/NP [facility] None  08/08/2022 11:15 AM Trula Slade, DPM [facility]      ACTION: Home visit completed

## 2022-02-23 NOTE — Progress Notes (Signed)
Heart and Vascular Care Navigation  02/23/2022  Benjamin [name] Jan 19, 1966 [id number]  Reason for Referral: inability to pay rent Patient is participating in a Managed Medicaid Plan:Yes  Engaged with patient by telephone for follow up visit for Heart and Vascular Care Coordination.                                                                                                   Assessment:    Pt had issues with false charges on his bank account and lost 2 months of his SSI payments so is two months behind on rent- has partial payment but has no way to get the rest and next check isn't received until end of the month.  CSW referred to patient care fund for partial payment of rent.  Patient and patient landlord informed of required documents and working on provided to CSW so check request can be initiated.                                  HRT/VAS Care Coordination     Patients Home Cardiology Office Heart Failure Clinic   Outpatient Care Team [facility] Paramedic Name: re-enrolled with paramedicine 11/03/21- Maralyn Sago [phone]   Exercise Physiologist Name: Lesia Hausen [phone]   RN Care Manager Name: [facility] case worker- Delice Bison [phone]   Social Worker Name: Rosetta Posner [phone]   Living arrangements for the past 2 months Single Family Home   Lives with: Significant Other   Patient Current Insurance Coverage Medicaid   Patient Has Concern With Paying Medical Bills No   Does Patient Have Prescription Coverage? Yes   Home Assistive Devices/Equipment None   DME Agency [employer]   Current home services DME       Social History:                                                                             SDOH Screenings   Alcohol Screen: Not on file  Depression (PHQ2-9): Not on file  Financial Resource Strain: High Risk   Difficulty of Paying Living Expenses: Hard  Food Insecurity: No Food Insecurity   Worried About Running  Out of Food in the Last Year: Never true   Ran Out of Food in the Last Year: Never true  Housing: High Risk   Last Housing Risk Score: 2  Physical Activity: Not on file  Social Connections: Not on file  Stress: Not on file  Tobacco Use: Medium Risk   Smoking Tobacco Use: Former   Smokeless Tobacco Use: Never   Passive Exposure: Not on file  Transportation Needs: No Transportation Needs   Lack of Transportation (Medical): No   Lack of Transportation (Non-Medical): No  SDOH Interventions: Financial Resources:    Patient care fund  Food Insecurity:  Gets food stamps  Housing Insecurity:  Housing Interventions:  (patient care fund referral)  Transportation:   Has personal vehicle    Follow-up plan:    CSW to submit check request once all documents are received for patient care fund referral.  [name], LCSW Clinical Social Worker [facility] Desk#: [phone] Cell#: [phone]

## 2022-02-23 NOTE — Telephone Encounter (Signed)
Reached out to [employer] for [employer] inhaler refill for [employer] reports they have no prescription on file for inhalers for Benjamin [name]. Benjamin [name] has combivent inhaler in the home that is empty and he is reporting he needs it. I will route to PCP. He does not see her in the office until July but will have her advise.   Maralyn Sago, EMT-Paramedic [phone] 02/23/2022

## 2022-02-23 NOTE — Progress Notes (Signed)
Paramedicine Encounter    Patient ID: Benjamin [name], male    DOB: [birth year], 56 y.o.   MRN: [id number]   Addendum for todays visit-   Last week he had called to get approval from [facility] in HF clinic for use of Furosix at home. She approved him to do so as well as holding torsemide for evening and adding extra potassium. He reports he followed these instructions but states he took two of the [place] Lasix doses one on Thursday and one on Friday. He states he held his torsemide for both days and added extra potassium. He reports he has urinated an abundance and states at first his urine was dark but now he reports his urine is light and he has no trouble urinating. I noted he had no edema noted and lungs were clear. I will suggest labs due to taking two [place] lasix back to back. I will send message to HF clinic.       ACTION: Home visit completed

## 2022-02-24 ENCOUNTER — Telehealth (HOSPITAL_COMMUNITY): Payer: Self-pay | Admitting: Licensed Clinical Social Worker

## 2022-02-24 DIAGNOSIS — F4311 Post-traumatic stress disorder, acute: Secondary | ICD-10-CM | POA: Diagnosis not present

## 2022-02-24 NOTE — Telephone Encounter (Signed)
H&V Care Navigation CSW Progress Note  Clinical Social Worker contacted patient by phone to assist with rent.  Patient is participating in a Managed Medicaid Plan:  Yes  SDOH Screenings   Alcohol Screen: Not on file  Depression (PHQ2-9): Medium Risk (04/30/2019)   Depression (PHQ2-9)    PHQ-2 Score: 20  Financial Resource Strain: High Risk (02/23/2022)   Overall Financial Resource Strain (CARDIA)    Difficulty of Paying Living Expenses: Hard  Food Insecurity: No Food Insecurity (08/03/2021)   Hunger Vital Sign    Worried About Running Out of Food in the Last Year: Never true    Ran Out of Food in the Last Year: Never true  Housing: High Risk (02/23/2022)   Housing    Last Housing Risk Score: 2  Physical Activity: Not on file  Social Connections: Not on file  Stress: Not on file  Tobacco Use: Medium Risk (02/16/2022)   Patient History    Smoking Tobacco Use: Former    Smokeless Tobacco Use: Never    Passive Exposure: Not on file  Transportation Needs: No Transportation Needs (05/31/2021)   PRAPARE - Hydrologist (Medical): No    Lack of Transportation (Non-Medical): No   CSW submitted check request to assist with pt rent  [name], [facility] Desk#: [phone] Cell#: [phone]

## 2022-03-02 DIAGNOSIS — I5032 Chronic diastolic (congestive) heart failure: Secondary | ICD-10-CM | POA: Diagnosis not present

## 2022-03-02 DIAGNOSIS — J9611 Chronic respiratory failure with hypoxia: Secondary | ICD-10-CM | POA: Diagnosis not present

## 2022-03-03 NOTE — Telephone Encounter (Signed)
Furoscix went through long appeal process, finally approved 02/08/22 with $4 copay.

## 2022-03-07 ENCOUNTER — Telehealth (HOSPITAL_COMMUNITY): Payer: Self-pay | Admitting: Licensed Clinical Social Worker

## 2022-03-07 NOTE — Telephone Encounter (Signed)
H&V Care Navigation CSW Progress Note  Clinical Social Worker met with patient to finalize rent assistance.  Patient is participating in a Managed Medicaid Plan:  Yes  SDOH Screenings   Alcohol Screen: Not on file  Depression (PHQ2-9): Low Risk  (12/21/2020)   Depression (PHQ2-9)    PHQ-2 Score: 0  Financial Resource Strain: High Risk (02/23/2022)   Overall Financial Resource Strain (CARDIA)    Difficulty of Paying Living Expenses: Hard  Food Insecurity: No Food Insecurity (08/03/2021)   Hunger Vital Sign    Worried About Running Out of Food in the Last Year: Never true    Ran Out of Food in the Last Year: Never true  Housing: High Risk (02/23/2022)   Housing    Last Housing Risk Score: 2  Physical Activity: Not on file  Social Connections: Not on file  Stress: Not on file  Tobacco Use: Medium Risk (02/16/2022)   Patient History    Smoking Tobacco Use: Former    Smokeless Tobacco Use: Never    Passive Exposure: Not on file  Transportation Needs: No Transportation Needs (05/31/2021)   PRAPARE - Hydrologist (Medical): No    Lack of Transportation (Non-Medical): No    Pt provided with check to complete his rent payment today.  He confirms he has the rest of the rent and  is taking payment to leasing office today.  Will continue to follow and assist as needed  [name], [facility] Desk#: [phone] Cell#: [phone]

## 2022-03-09 ENCOUNTER — Telehealth (HOSPITAL_COMMUNITY): Payer: Self-pay

## 2022-03-09 NOTE — Telephone Encounter (Signed)
Attempted to reach Mr. List to set up home visit for tomorrow with no answer. I left a VM and a text for him to call me. I will continue to reach out.   Maralyn Sago, EMT-Paramedic [phone] 03/09/2022

## 2022-03-10 ENCOUNTER — Other Ambulatory Visit: Payer: Self-pay | Admitting: Nurse Practitioner

## 2022-03-10 ENCOUNTER — Other Ambulatory Visit (HOSPITAL_COMMUNITY): Payer: Self-pay

## 2022-03-10 ENCOUNTER — Other Ambulatory Visit: Payer: Self-pay | Admitting: Family Medicine

## 2022-03-10 ENCOUNTER — Telehealth (HOSPITAL_COMMUNITY): Payer: Self-pay

## 2022-03-10 DIAGNOSIS — R1084 Generalized abdominal pain: Secondary | ICD-10-CM

## 2022-03-10 NOTE — Telephone Encounter (Signed)
Benjamin [name] reports he is needing his diabetic shoes. I contacted the [facility] in [place] where he got the last pair and they report they need the MD to sent the order to their clinic so they can set up an appointment for him. I will route to the provider and have him send the order directly to the [facility].   [facility]  [address] [place] [address] PHONE- [phone] FAX- [phone] EMAIL- [name], EMT-Paramedic [phone] 03/10/2022

## 2022-03-10 NOTE — Progress Notes (Signed)
Paramedicine Encounter    Patient ID: Benjamin [name], male    DOB: Jun 10, 1966, 55 y.o.   MRN: [id number]  Arrived for home visit with Benjamin [name] where he was walking outside reporting to be feeling good. He denied any chest pain, shortness of breath, dizziness. He says he has been taking his medications and is needing a refill on his bubble packs. I submitted request for same. He also reports he needs a nebulizer machine, I advised him to request this tomorrow at his pulmonary appointment.  He says he has the albuterol packets but no machine- he says the one he had was borrowed from a friend.    Vitals obtained: WT- 352lbs BP- 160/90  HR- 88 O2- 97% RR- 18 CBG- 94  Lungs clear on assessment. No lower leg swelling. Abdomen soft, no distention.  He reports he is urinating normally.   He reports he lost his SNAP/EBT card. I called and had them send him a new one. They reported that his case is closed and he needs to recert for his food stamps. I gathered a list of documents he will need to take to DSS to do so. He reports he will do so today. He knows this must have this completed by next Thursday.   He also reports he is needing his diabetic shoes. I contacted the [facility] which is where he got them from previously and they state they need order from the doctor before he can be scheduled for same. I sent message to podiatrist Dr. Jacqualyn Posey for same. I will continue to follow up.   We reviewed upcoming appointments and I wrote down reminders for same.   I will see Benjamin [name] in a few weeks. He agreed and knows to reach out if needed.    Home visit complete.   [name], [place] 03/10/2022      Patient Care Team: [name], NP as PCP - General (Nurse Practitioner) [name], MD as PCP - Cardiology (Cardiology) [name], LCSW as Social Worker (Licensed Clinical Social Worker)  Patient Active Problem List   Diagnosis Date Noted   Atrial  fibrillation with RVR ([place]) 01/08/2021   Acute respiratory failure ([place]) 12/24/2020   Obstructive sleep apnea treated with BiPAP 12/24/2020   Polysubstance abuse ([place]) 08/20/2020   Alcohol abuse 08/20/2020   Atrial fibrillation with rapid ventricular response ([place]) 08/19/2020   Obesity hypoventilation syndrome ([place]) 06/12/2020   Healthcare maintenance 06/12/2020   Acute on chronic diastolic heart failure ([place]) 02/24/2020   Diabetic neuropathy ([place]) 11/20/2019   Coagulation disorder ([place]) 11/20/2019   Acute on chronic diastolic CHF (congestive heart failure), NYHA class 3 ([place]) 07/30/2019   Diabetes mellitus without complication ([place]) 27/02/2375   Hypertensive emergency    BRBPR (bright red blood per rectum) 06/12/2019   Hypertension    CHF (congestive heart failure) ([place])    Sleep apnea    Chronic respiratory failure ([place])    Hypokalemia 05/29/2019   GI bleed 05/29/2019   Anemia 05/28/2019   Iron deficiency anemia 03/11/2019   OSA on CPAP 03/11/2019   HLD (hyperlipidemia) 03/11/2019   Elevated troponin 03/11/2019   GERD (gastroesophageal reflux disease) 03/11/2019   Rectal bleeding 02/20/2019   Dyspnea 02/06/2019   COVID-19 virus infection 02/06/2019   Bilateral lower extremity edema    Morbid obesity with BMI of 50.0-59.9, adult (HCC)    PAF (paroxysmal atrial fibrillation) ([place])    PE (pulmonary thromboembolism) ([place]) 01/21/2019   Hypertensive urgency  01/21/2019   Diabetes mellitus type 2 in obese ([place]) 01/21/2019    Current Outpatient Medications:    albuterol (PROVENTIL) (2.5 MG/3ML) 0.083% nebulizer solution, Take 3 mLs (2.5 mg total) by nebulization every 6 (six) hours as needed for wheezing or shortness of breath., Disp: 150 mL, Rfl: 1   atorvastatin (LIPITOR) 40 MG tablet, Take 1 tablet (40 mg total) by mouth at bedtime., Disp: 90 tablet, Rfl: 3   Blood Glucose Monitoring Suppl (TRUE METRIX METER) w/Device KIT, Use as instructed. Check blood glucose level by  fingerstick twice per day., Disp: 1 kit, Rfl: 0   Blood Pressure Monitor DEVI, Please provide patient with insurance approved blood pressure device with L-XL cuff. BMI 55, Disp: 1 each, Rfl: 0   carboxymethylcellulose (REFRESH PLUS) 0.5 % SOLN, Place 1 drop into both eyes daily., Disp: , Rfl:    diltiazem (CARDIZEM CD) 360 MG 24 hr capsule, Take 1 capsule (360 mg total) by mouth daily., Disp: 90 capsule, Rfl: 3   doxazosin (CARDURA) 2 MG tablet, Take 1 tablet (2 mg total) by mouth daily. (Patient not taking: Reported on 02/14/2022), Disp: 30 tablet, Rfl: 11   ferrous sulfate (FEROSUL) 325 (65 FE) MG tablet, TAKE 1 TABLET (325 MG TOTAL) BY MOUTH DAILY AT NOON., Disp: 90 tablet, Rfl: 0   glucose blood (TRUE METRIX BLOOD GLUCOSE TEST) test strip, Use as instructed. Check blood glucose level by fingerstick twice per day., Disp: 100 each, Rfl: 12   hydrALAZINE (APRESOLINE) 50 MG tablet, Take 1 tablet (50 mg total) by mouth 3 (three) times daily., Disp: 180 tablet, Rfl: 3   losartan (COZAAR) 50 MG tablet, Take 1 tablet (50 mg total) by mouth daily., Disp: 90 tablet, Rfl: 3   magnesium oxide (MAG-OX) 400 (240 Mg) MG tablet, Take 1 tablet (400 mg total) by mouth 2 (two) times daily., Disp: 90 tablet, Rfl: 3   metFORMIN (GLUCOPHAGE) 500 MG tablet, Take 1 tablet (500 mg total) by mouth 2 (two) times daily with a meal., Disp: 180 tablet, Rfl: 0   metolazone (ZAROXOLYN) 2.5 MG tablet, Take only as directed by CHF clinic., Disp: 10 tablet, Rfl: 0   Misc. Devices MISC, Requires O2 @ 3L/min continuously via nasal canula and home fill system,., Disp: 1 each, Rfl: 0   Misc. Devices MISC, Please provide Mr. Janek with insurance approved portable O2 concentrator ICD 10 J96.11 Z99.81, Disp: 1 each, Rfl: 0   Misc. Devices MISC, Please provide patient with insurance approved LARGE right hand splint ICD 10 G56.01, Disp: 1 each, Rfl: 0   Misc. Devices MISC, Please provide patient wit insurance approved diabetic custom  molded shoes (QTY 2) and custom molded multi density inserts (1 for each shoe)., Disp: 1 each, Rfl: 0   omeprazole (PRILOSEC) 20 MG capsule, TAKE 1 CAPSULE 2 (TWO) TIMES DAILY BEFORE A MEAL. TAKE FIRST CAPSULE 30 MIN PRIOR TO EATING OR TAKING OTHER MEDICATIONS. (AM+BEDTIME), Disp: 180 capsule, Rfl: 1   potassium chloride (KLOR-CON) 10 MEQ tablet, Take 8 tablets (80 mEq total) by mouth daily., Disp: 240 tablet, Rfl: 11   PROVENTIL HFA 108 (90 Base) MCG/ACT inhaler, INHALE 2 PUFFS BY MOUTH EVERY 6 (SIX) HOURS AS NEEDED FOR WHEEZING OR SHORTNESS OF BREATH., Disp: 18 g, Rfl: 6   rivaroxaban (XARELTO) 20 MG TABS tablet, Take 1 tablet (20 mg total) by mouth daily with supper., Disp: 90 tablet, Rfl: 3   spironolactone (ALDACTONE) 50 MG tablet, Take 1 tablet (50 mg total) by mouth daily.,  Disp: 30 tablet, Rfl: 11   STOOL SOFTENER 100 MG capsule, TAKE 1 CAPSULE BY MOUTH 2 (TWO) TIMES DAILY (NOON+BEDTIME) (Patient not taking: Reported on 02/23/2022), Disp: 60 capsule, Rfl: 0   sulfamethoxazole-trimethoprim (BACTRIM DS) 800-160 MG tablet, Take 1 tablet by mouth 2 (two) times daily. (Patient not taking: Reported on 02/23/2022), Disp: 14 tablet, Rfl: 0   tamsulosin (FLOMAX) 0.4 MG CAPS capsule, Take 0.4 mg by mouth daily after supper., Disp: , Rfl:    torsemide (DEMADEX) 20 MG tablet, Take 2 tablets (40 mg total) by mouth 2 (two) times daily., Disp: 60 tablet, Rfl: 11   TRUEplus Lancets 28G MISC, Use as instructed. Check blood glucose level by fingerstick twice per day., Disp: 200 each, Rfl: 3 No Known Allergies   Social History   Socioeconomic History   Marital status: Single    Spouse name: Not on file   Number of children: Not on file   Years of education: Not on file   Highest education level: Not on file  Occupational History   Not on file  Tobacco Use   Smoking status: Former   Smokeless tobacco: Never   Tobacco comments:    smoked weed in the past  Vaping Use   Vaping Use: Never used  Substance  and Sexual Activity   Alcohol use: Not Currently   Drug use: Yes    Types: Marijuana, Cocaine   Sexual activity: Not Currently  Other Topics Concern   Not on file  Social History Narrative   Not on file   Social Determinants of Health   Financial Resource Strain: High Risk (02/23/2022)   Overall Financial Resource Strain (CARDIA)    Difficulty of Paying Living Expenses: Hard  Food Insecurity: No Food Insecurity (08/03/2021)   Hunger Vital Sign    Worried About Running Out of Food in the Last Year: Never true    Ran Out of Food in the Last Year: Never true  Transportation Needs: No Transportation Needs (05/31/2021)   PRAPARE - Hydrologist (Medical): No    Lack of Transportation (Non-Medical): No  Physical Activity: Not on file  Stress: Not on file  Social Connections: Not on file  Intimate Partner Violence: Not on file    Physical Exam      Future Appointments  Date Time Provider [place]  03/11/2022  3:45 PM [name], MD [facility] None  03/30/2022 10:30 AM [name], NP [facility] None  03/30/2022 12:00 PM [facility] PA/NP [facility] None  08/08/2022 11:15 AM Trula Slade, DPM [facility]     ACTION: Home visit completed

## 2022-03-11 ENCOUNTER — Ambulatory Visit (INDEPENDENT_AMBULATORY_CARE_PROVIDER_SITE_OTHER): Payer: Medicaid Other | Admitting: Pulmonary Disease

## 2022-03-11 ENCOUNTER — Encounter: Payer: Self-pay | Admitting: Pulmonary Disease

## 2022-03-11 VITALS — BP 134/70 | HR 88 | Temp 98.6°F | Ht 70.0 in | Wt 355.4 lb

## 2022-03-11 DIAGNOSIS — G4733 Obstructive sleep apnea (adult) (pediatric): Secondary | ICD-10-CM | POA: Diagnosis not present

## 2022-03-11 DIAGNOSIS — E662 Morbid (severe) obesity with alveolar hypoventilation: Secondary | ICD-10-CM

## 2022-03-11 DIAGNOSIS — J9611 Chronic respiratory failure with hypoxia: Secondary | ICD-10-CM | POA: Diagnosis not present

## 2022-03-11 DIAGNOSIS — J9801 Acute bronchospasm: Secondary | ICD-10-CM

## 2022-03-11 MED ORDER — COMBIVENT RESPIMAT 20-100 MCG/ACT IN AERS: 1.0000 | INHALATION_SPRAY | Freq: Four times a day (QID) | RESPIRATORY_TRACT | 5 refills | Status: DC

## 2022-03-17 ENCOUNTER — Telehealth (HOSPITAL_COMMUNITY): Payer: Self-pay

## 2022-03-17 NOTE — Telephone Encounter (Signed)
Spoke to [place] who reports he obtained his bubble packs and has no current needs. I reminded him to be sure to complete his Food Stamps re-certification as this has to be done by tomorrow. He reports he plans to do so tomorrow. I will check back in- in two weeks. He agreed. Call complete.    Maralyn Sago, EMT-Paramedic [phone] 03/17/2022

## 2022-03-29 NOTE — Progress Notes (Signed)
Advanced Heart Failure Clinic Note   PCP: [name], NP PCP-Cardiologist: Dorris Carnes, MD  [facility]: Dr. Aundra Dubin   HPI: Benjamin [name] is a 56 y.o. with hx of diastolic CHF, HTN, DM, Atrial fib, PE in April 2020 (on Xarelto), OSA, GERD, chronic elevation of troponin.    Admitted 01/21/2019 with increased dyspnea and had PE. He was anticoagulated.    Patient re-admitted 5/20 for syncope/cough/dyspnea, COVID+, CT positive for bilateral GGOs, admitted to [facility] and discharged after 6 days, on O2.  Rec'd Cards f/u.   Patient re-admitted 02/20/19 for bright red blood per rectum, eval'ed by GI, had EGD/colon, dx'd with hemorrhoidal bleeding, discharged after 9 days on O2. He was again Covid + .   Readmitted 03/11/19 with increased shortness of breath. He did not have home oxygen. He was discharged to home the next day.    He presented to [facility] ED 04/11/19 with lower extremity edema. COVID negative. Diuresed with lasix drip and transitioned to torsemide 40 mg twice daily. Had [place] with mild volume overload and preserved cardiac output. Discharge weight was 407.9 pounds.    He was readmitted with symptomatic anemia 05/28/19 with hgb 7.1. Received 1UPRBCs. GI consulted. He started on anusol and continued on stool softner.    He was readmitted to the hospital on 07/16/19 when he presented to the ED w/ worsening symptoms and was admitted by IM for a/c diastolic CHF and treated w/ IV Lasix. Had 2 day hospital stay and was discharged on 10/29. Echo was done 10/28 and showed normal LVEF 65-70%, RV interpreted as normal but suspect significant RV dysfunction.  After diuresis w/ IV Lasix, he was placed back on home diuretic regimen, torsemide 100 mg bid. Lisinopril 5 mg was also added to regimen. AHF team was not consulted that admit.   He was seen back in clinic 07/30/19. Felt poorly. SOB at rest and worse w/ exertion. Also w/ orthopnea/PND. Significant wt gain of 20 lb up to 428 lb (dry wt ~407 lb), w/ marked abdominal  distention and LEE and poor response to IV diuretics.  Once diuresed transitioned to torsemide 100 mg twice a day. D/C 408 pounds.   Zio patch on 10/20/19-No arrhythmia.   Re-admitted 08/19/20-08/24/20 for  A fib RVR. A. fib RVR in the setting of noncompliance ethanol and cocaine. Cardizem started and labetalol continued.  12/3 TEE (EF 60-65%) with DCCV successfully converted to SR. Diuresed with IV lasix and had AKI in setting of over diuresis. SCr 1.59 on admit, bumped to 2.5. Diuretics held + IVF hydration.   Admitted 12/24/20 with hypertensive emergency and respiratory failure. .   Admitted 4/22 with A Fib RVR & AMS. Diltiazem increased to 360 mg daily. Chemically converted to NSR.  + Cocaine.   Seen in ED 11/22 for COPD exacerbation.  DC'd from paramedicine due to lack of communication.  Follow up 1/23 he had not been seen since 5/22. He appeared volume overloaded and torsemide increased.   Seen 11/02/21 and had been off all meds x 1 week, volume overloaded. Meds restarted and instructed to take metolazone 2.5 x 2 days w/ extra 40 kcl, however labs showed elevated BUN/SCr and left message to take metolazone x 1 day.  Re-enrolled in paramedicine.  Echo was done today and reviewed, EF 60-65%, moderate LVH, mid-cavity LV gradient to 29 mmHg, RV normal.   Today he returns for HF followup.  BP is high but he has not taken his am meds.  He does report  that he has been taking his meds as ordered.  No dyspnea walking on flat ground.  He gets short of breath walking up stairs.  No chest pain.  No orthopnea/PND.  No palpitations.  No lightheadedness. He uses home oxygen during the day and Bipap at night. Weight stable.   ECG (personally reviewed): NSR, nonspecific T wave changes.   Labs (11/20): BNP 32, K 3.6, creatinine 1.09  Labs (12/20): K 3.3, creatinine 1.28 Labs (10/10/19): K 3.9 Creatinine 1.4  Labs (11/05/19) : K 3.2 Creatinine 1.47  Labs (12/30/2019) : K 3.5 Creatinine 1.14  Labs (03/02/2020):  K 3.8 Creatinine 1.09.  Labs (12/21): K 4.0, Creatinine 1.17, Magnesium 1.6 Labs (09/15/20): K 3.1, Creatinine 2.54 Labs (1/22): K 3.1, creatinine 1.56 Labs (5/22): K 3.6, creatinine 1.89 Labs (1/23): K 3.9, creatinine 1.34 Labs (3/23): K 4.0, creatinine 1.29 => 1.77  Review of Systems: All systems reviewed and negative except as per HPI.   PMH: 1. Atrial fibrillation: Paroxysmal.  - DCCV (08/21/20): converted to SR 2. Pulmonary embolus: 5/20.  3. OHS/OSA: He is on home oxygen during the day and uses CPAP at night.  4. Morbid obesity.  5. Chronic diastolic CHF:  - RHC (1/95): mean RA 12, PA 40/25 mean 31, mean PCWP 23, CI 2.47, PVR 1.1 WU.  - Echo (10/20): EF 65-70%, mild LVH, normal RV size and systolic function.  - TEE (12/21): EF 60-65% - Cardiolite in 12/21: fixed apical defect is likely artifact, no ischemia.  - Echo (5/23): EF 60-65%, moderate LVH, mid-cavity LV gradient to 29 mmHg, RV normal.  6. Type 2 diabetes 7. HTN 8. Rectal bleeding: ?Hemorrhoidal.  9. COVID-19 infection 7/20.  10. ZIo Patch - 10/20/19 no arrhythmias 11. CKD: Stage 3  Current Outpatient Medications  Medication Sig Dispense Refill   albuterol (PROVENTIL) (2.5 MG/3ML) 0.083% nebulizer solution Take 3 mLs (2.5 mg total) by nebulization every 6 (six) hours as needed for wheezing or shortness of breath. 150 mL 1   atorvastatin (LIPITOR) 40 MG tablet Take 1 tablet (40 mg total) by mouth at bedtime. 90 tablet 3   Blood Glucose Monitoring Suppl (TRUE METRIX METER) w/Device KIT Use as instructed. Check blood glucose level by fingerstick twice per day. 1 kit 0   Blood Pressure Monitor DEVI Please provide patient with insurance approved blood pressure device with L-XL cuff. BMI 55 1 each 0   carboxymethylcellulose (REFRESH PLUS) 0.5 % SOLN Place 1 drop into both eyes daily.     diltiazem (CARDIZEM CD) 360 MG 24 hr capsule Take 1 capsule (360 mg total) by mouth daily. 90 capsule 3   doxazosin (CARDURA) 2 MG tablet  Take 1 tablet (2 mg total) by mouth daily. 30 tablet 11   ferrous sulfate (FEROSUL) 325 (65 FE) MG tablet TAKE 1 TABLET (325 MG TOTAL) BY MOUTH DAILY AT NOON. 90 tablet 0   FUROSCIX 80 MG/10ML CTKT Inject into the skin.     glucose blood (TRUE METRIX BLOOD GLUCOSE TEST) test strip Use as instructed. Check blood glucose level by fingerstick twice per day. 100 each 12   hydrALAZINE (APRESOLINE) 50 MG tablet Take 1 tablet (50 mg total) by mouth 3 (three) times daily. 180 tablet 3   Ipratropium-Albuterol (COMBIVENT RESPIMAT) 20-100 MCG/ACT AERS respimat Inhale 1 puff into the lungs every 6 (six) hours. 4 g 5   losartan (COZAAR) 50 MG tablet Take 1 tablet (50 mg total) by mouth daily. 90 tablet [place] [id number]  TEST KIT See admin instructions.     magnesium oxide (MAG-OX) 400 (240 Mg) MG tablet Take 1 tablet (400 mg total) by mouth 2 (two) times daily. 90 tablet 3   metFORMIN (GLUCOPHAGE) 500 MG tablet Take 1 tablet (500 mg total) by mouth 2 (two) times daily with a meal. 180 tablet 0   metolazone (ZAROXOLYN) 2.5 MG tablet Take only as directed by CHF clinic. 10 tablet 0   Misc. Devices MISC Requires O2 @ 3L/min continuously via nasal canula and home fill system,. 1 each 0   Misc. Devices MISC Please provide Mr. Heuring with insurance approved portable O2 concentrator ICD 10 J96.11 Z99.81 1 each 0   Misc. Devices MISC Please provide patient with insurance approved LARGE right hand splint ICD 10 G56.01 1 each 0   Misc. Devices MISC Please provide patient wit insurance approved diabetic custom molded shoes (QTY 2) and custom molded multi density inserts (1 for each shoe). 1 each 0   omeprazole (PRILOSEC) 20 MG capsule TAKE 1 CAPSULE 2 (TWO) TIMES DAILY BEFORE A MEAL. TAKE FIRST CAPSULE 30 MIN PRIOR TO EATING OR TAKING OTHER MEDICATIONS. (AM+BEDTIME) 180 capsule 1   potassium chloride (KLOR-CON) 10 MEQ tablet Take 8 tablets (80 mEq total) by mouth daily. 240 tablet 11   rivaroxaban (XARELTO) 20  MG TABS tablet Take 1 tablet (20 mg total) by mouth daily with supper. 90 tablet 3   spironolactone (ALDACTONE) 50 MG tablet Take 1 tablet (50 mg total) by mouth daily. 30 tablet 11   STOOL SOFTENER 100 MG capsule TAKE 1 CAPSULE BY MOUTH 2 (TWO) TIMES DAILY (NOON+BEDTIME) 60 capsule 0   tamsulosin (FLOMAX) 0.4 MG CAPS capsule Take 0.4 mg by mouth daily after supper.     torsemide (DEMADEX) 20 MG tablet Take 2 tablets (40 mg total) by mouth 2 (two) times daily. 60 tablet 11   TRUEplus Lancets 28G MISC Use as instructed. Check blood glucose level by fingerstick twice per day. 200 each 3   No current facility-administered medications for this visit.   No Known Allergies  Social History   Socioeconomic History   Marital status: Single    Spouse name: Not on file   Number of children: Not on file   Years of education: Not on file   Highest education level: Not on file  Occupational History   Not on file  Tobacco Use   Smoking status: Former   Smokeless tobacco: Never   Tobacco comments:    smoked weed in the past  Vaping Use   Vaping Use: Never used  Substance and Sexual Activity   Alcohol use: Not Currently   Drug use: Yes    Types: Marijuana, Cocaine   Sexual activity: Not Currently  Other Topics Concern   Not on file  Social History Narrative   Not on file   Social Determinants of Health   Financial Resource Strain: High Risk (02/23/2022)   Overall Financial Resource Strain (CARDIA)    Difficulty of Paying Living Expenses: Hard  Food Insecurity: No Food Insecurity (08/03/2021)   Hunger Vital Sign    Worried About Running Out of Food in the Last Year: Never true    Ran Out of Food in the Last Year: Never true  Transportation Needs: No Transportation Needs (05/31/2021)   PRAPARE - Hydrologist (Medical): No    Lack of Transportation (Non-Medical): No  Physical Activity: Not on file  Stress: Not on file  Social  Connections: Not on file   Intimate Partner Violence: Not on file   Family History  Problem Relation Age of Onset   Hypertension Mother    Diabetes Mother    There were no vitals taken for this visit.  Wt Readings from Last 3 Encounters:  03/11/22 (!) 161.2 kg (355 lb 6.4 oz)  03/10/22 (!) 159.7 kg (352 lb)  02/23/22 (!) 158.8 kg (350 lb)   PHYSICAL EXAM: General: NAD Neck: Thick. No JVD, no thyromegaly or thyroid nodule.  Lungs: Clear to auscultation bilaterally with normal respiratory effort. CV: Nondisplaced PMI.  Heart regular S1/S2, no S3/S4, no murmur.  No peripheral edema.  No carotid bruit.  Normal pedal pulses.  Abdomen: Soft, nontender, no hepatosplenomegaly, no distention.  Skin: Intact without lesions or rashes.  Neurologic: Alert and oriented x 3.  Psych: Normal affect. Extremities: No clubbing or cyanosis.  HEENT: Normal.   ASSESSMENT & PLAN: 1. Chronic diastolic CHF: Echo today showed EF 60-65%, moderate LVH, mid-cavity LV gradient to 29 mmHg, RV normal.  Suspect LVH is due to long-standing HTN.  He is not volume overloaded on exam.  NYHA class II symptoms.  - Continue torsemide 40 mg bid.  BMET/BNP today.  - Continue spironolactone 50 mg daily.  - Just completed treatment for UTI with abx, would not start SGLT2 inhibitor yet.  Would aim to start at next appt.  - Cannot get Cardiomems as he has Medicaid.  2. Atrial fibrillation: Paroxysmal.  NSR today.  - Continue current dose of diltiazem.  - Continue Xarelto. No bleeding issues. 3. H/o PE: 5/20, continue Xarelto.  4. OHS/OSA: Uses O2 during the day and CPAP at night.  5. HTN: Controlled on current regimen.  High this morning but no meds yet, BP has been ok at home.  6. DM: Per PCP. 7. Obesity: There is no height or weight on file to calculate BMI.  - Continue weight loss efforts. 8. Substance abuse: Continue substance abuse meetings.  Not using cocaine.  9. SDOH: Appreciate paramedicine assistance, I think this helps him  tremendously. - He has a car and a place to live. - He has a scale and a PCP. - Meds thru [place] in bubble packs.  Followup 2 months with APP.  [facility] 03/29/2022

## 2022-03-30 ENCOUNTER — Other Ambulatory Visit (HOSPITAL_COMMUNITY): Payer: Self-pay

## 2022-03-30 ENCOUNTER — Encounter (HOSPITAL_COMMUNITY): Payer: Self-pay

## 2022-03-30 ENCOUNTER — Ambulatory Visit (HOSPITAL_COMMUNITY)
Admission: RE | Admit: 2022-03-30 | Discharge: 2022-03-30 | Disposition: A | Discharge status: Home / Self Care | Payer: Medicaid Other | Source: Ambulatory Visit | Attending: Family Medicine | Admitting: Family Medicine

## 2022-03-30 ENCOUNTER — Encounter: Payer: Self-pay | Admitting: Nurse Practitioner

## 2022-03-30 ENCOUNTER — Ambulatory Visit: Payer: Medicaid Other | Attending: Nurse Practitioner | Admitting: Nurse Practitioner

## 2022-03-30 ENCOUNTER — Telehealth (HOSPITAL_COMMUNITY): Payer: Self-pay

## 2022-03-30 VITALS — BP 136/88 | HR 92 | Temp 99.1°F | Resp 16 | Wt 350.0 lb

## 2022-03-30 VITALS — BP 140/82 | HR 78 | Wt 358.4 lb

## 2022-03-30 DIAGNOSIS — Z6841 Body Mass Index (BMI) 40.0 and over, adult: Secondary | ICD-10-CM | POA: Insufficient documentation

## 2022-03-30 DIAGNOSIS — I5032 Chronic diastolic (congestive) heart failure: Secondary | ICD-10-CM | POA: Diagnosis not present

## 2022-03-30 DIAGNOSIS — Z7901 Long term (current) use of anticoagulants: Secondary | ICD-10-CM | POA: Insufficient documentation

## 2022-03-30 DIAGNOSIS — I10 Essential (primary) hypertension: Secondary | ICD-10-CM

## 2022-03-30 DIAGNOSIS — I130 Hypertensive heart and chronic kidney disease with heart failure and stage 1 through stage 4 chronic kidney disease, or unspecified chronic kidney disease: Secondary | ICD-10-CM | POA: Diagnosis not present

## 2022-03-30 DIAGNOSIS — Z86711 Personal history of pulmonary embolism: Secondary | ICD-10-CM | POA: Insufficient documentation

## 2022-03-30 DIAGNOSIS — E1165 Type 2 diabetes mellitus with hyperglycemia: Secondary | ICD-10-CM

## 2022-03-30 DIAGNOSIS — Z79899 Other long term (current) drug therapy: Secondary | ICD-10-CM | POA: Insufficient documentation

## 2022-03-30 DIAGNOSIS — I2699 Other pulmonary embolism without acute cor pulmonale: Secondary | ICD-10-CM

## 2022-03-30 DIAGNOSIS — Z7984 Long term (current) use of oral hypoglycemic drugs: Secondary | ICD-10-CM | POA: Diagnosis not present

## 2022-03-30 DIAGNOSIS — Z8616 Personal history of COVID-19: Secondary | ICD-10-CM | POA: Diagnosis not present

## 2022-03-30 DIAGNOSIS — E669 Obesity, unspecified: Secondary | ICD-10-CM | POA: Diagnosis not present

## 2022-03-30 DIAGNOSIS — E1122 Type 2 diabetes mellitus with diabetic chronic kidney disease: Secondary | ICD-10-CM | POA: Insufficient documentation

## 2022-03-30 DIAGNOSIS — Z1159 Encounter for screening for other viral diseases: Secondary | ICD-10-CM

## 2022-03-30 DIAGNOSIS — Z9989 Dependence on other enabling machines and devices: Secondary | ICD-10-CM

## 2022-03-30 DIAGNOSIS — I480 Paroxysmal atrial fibrillation: Secondary | ICD-10-CM

## 2022-03-30 DIAGNOSIS — G4733 Obstructive sleep apnea (adult) (pediatric): Secondary | ICD-10-CM

## 2022-03-30 DIAGNOSIS — F1910 Other psychoactive substance abuse, uncomplicated: Secondary | ICD-10-CM

## 2022-03-30 DIAGNOSIS — E118 Type 2 diabetes mellitus with unspecified complications: Secondary | ICD-10-CM

## 2022-03-30 MED ORDER — EMPAGLIFLOZIN 10 MG PO TABS: 10.0000 mg | ORAL_TABLET | Freq: Every day | ORAL | 6 refills | Status: DC

## 2022-03-30 NOTE — Patient Instructions (Signed)
Thank you for coming in today  No Labs today  START Jardiance 10 mg 1 tablet daily  Your physician recommends that you schedule a follow-up appointment in:  6 weeks in clinic 3 months with Dr. Shirlee Latch  At the Advanced [facility] Failure Clinic, you and your health needs are our priority. As part of our continuing mission to provide you with exceptional heart care, we have created designated Provider Care Teams. These Care Teams include your primary Cardiologist (physician) and Advanced Practice Providers (APPs- Physician Assistants and Nurse Practitioners) who all work together to provide you with the care you need, when you need it.   You may see any of the following providers on your designated Care Team at your next follow up: Dr Arvilla Meres Dr Carron Curie, NP Robbie Lis, [place] [facility], [place] Karle Plumber, PharmD   Please be sure to bring in all your medications bottles to every appointment.   If you have any questions or concerns before your next appointment please send [place] a message through [place] or call our office at [phone].    TO LEAVE A MESSAGE FOR THE NURSE SELECT OPTION 2, PLEASE LEAVE A MESSAGE INCLUDING: YOUR NAME DATE OF BIRTH CALL BACK NUMBER REASON FOR CALL**this is important as we prioritize the call backs  YOU WILL RECEIVE A CALL BACK THE SAME DAY AS LONG AS YOU CALL BEFORE 4:00 PM

## 2022-03-30 NOTE — Progress Notes (Signed)
Paramedicine Encounter    Patient ID: Benjamin [name], male    DOB: 08-31-1966, 56 y.o.   MRN: [id number]   Met with Benjamin [name] in clinic today where he was seen by Allena Katz NP. He reports feeling fine with only complaints of feet pain today. He denied shortness of breath, he is walking with a cane today with his portable oxygen in place. He was seen by PCP prior to this visit.   Benjamin [name] reports he has successfully obtained his nebulizer machine for his albuterol treatments at home and has been using them daily.   He did not bring bubble packs today for med verification but reports he has only 4 days left. I texted pharmacist Christy Sartorius at [facility] to make him aware including added medication of Jardiance today.   Benjamin [name] evaluated him and added Jardiance to his meds today. I will follow up and ensure same gets added to bubble packs.   [place] (with clothes and portable O2) BP- 140/82 HR- 78 O2- 98%  myCHART set up on phone and verified for patient- I educated him on how to navigate it and he was happy about having access to same- appreciate PCP setting up same.   Benjamin [name] obtained document from [name] LCSW needed for housing re-certification and food stamps.  I plan to follow up in home in two weeks but will verify meds next week with [place] and patient to ensure Benjamin [name] is added.   Clinic visit complete.   [name], [place] 03/30/2022       Patient Care Team: [name], NP as PCP - General (Nurse Practitioner) [name], MD as PCP - Cardiology (Cardiology) [name], LCSW as Social Worker (Licensed Clinical Social Worker)  Patient Active Problem List   Diagnosis Date Noted   Atrial fibrillation with RVR ([place]) 01/08/2021   Acute respiratory failure ([place]) 12/24/2020   Obstructive sleep apnea treated with BiPAP 12/24/2020   Polysubstance abuse ([place]) 08/20/2020   Alcohol abuse 08/20/2020   Atrial  fibrillation with rapid ventricular response ([place]) 08/19/2020   Obesity hypoventilation syndrome ([place]) 06/12/2020   Healthcare maintenance 06/12/2020   Acute on chronic diastolic heart failure ([place]) 02/24/2020   Diabetic neuropathy ([place]) 11/20/2019   Coagulation disorder ([place]) 11/20/2019   Acute on chronic diastolic CHF (congestive heart failure), NYHA class 3 ([place]) 07/30/2019   Diabetes mellitus without complication ([place]) 94/70/9628   Hypertensive emergency    BRBPR (bright red blood per rectum) 06/12/2019   Hypertension    CHF (congestive heart failure) (HCC)    Sleep apnea    Chronic respiratory failure ([place])    Hypokalemia 05/29/2019   GI bleed 05/29/2019   Anemia 05/28/2019   Iron deficiency anemia 03/11/2019   OSA on CPAP 03/11/2019   HLD (hyperlipidemia) 03/11/2019   Elevated troponin 03/11/2019   GERD (gastroesophageal reflux disease) 03/11/2019   Rectal bleeding 02/20/2019   Dyspnea 02/06/2019   COVID-19 virus infection 02/06/2019   Bilateral lower extremity edema    Morbid obesity with BMI of 50.0-59.9, adult (HCC)    PAF (paroxysmal atrial fibrillation) ([place])    PE (pulmonary thromboembolism) ([place]) 01/21/2019   Hypertensive urgency 01/21/2019   Diabetes mellitus type 2 in obese ([place]) 01/21/2019    Current Outpatient Medications:    albuterol (PROVENTIL) (2.5 MG/3ML) 0.083% nebulizer solution, Take 3 mLs (2.5 mg total) by nebulization every 6 (six) hours as needed for wheezing or shortness of breath., Disp: 150 mL, Rfl: 1  atorvastatin (LIPITOR) 40 MG tablet, Take 1 tablet (40 mg total) by mouth at bedtime., Disp: 90 tablet, Rfl: 3   Blood Glucose Monitoring Suppl (TRUE METRIX METER) w/Device KIT, Use as instructed. Check blood glucose level by fingerstick twice per day., Disp: 1 kit, Rfl: 0   Blood Pressure Monitor DEVI, Please provide patient with insurance approved blood pressure device with L-XL cuff. BMI 55, Disp: 1 each, Rfl: 0   carboxymethylcellulose (REFRESH  PLUS) 0.5 % SOLN, Place 1 drop into both eyes daily., Disp: , Rfl:    diltiazem (CARDIZEM CD) 360 MG 24 hr capsule, Take 1 capsule (360 mg total) by mouth daily., Disp: 90 capsule, Rfl: 3   doxazosin (CARDURA) 2 MG tablet, Take 1 tablet (2 mg total) by mouth daily., Disp: 30 tablet, Rfl: 11   empagliflozin (JARDIANCE) 10 MG TABS tablet, Take 1 tablet (10 mg total) by mouth daily before breakfast., Disp: 30 tablet, Rfl: 6   ferrous sulfate (FEROSUL) 325 (65 FE) MG tablet, TAKE 1 TABLET (325 MG TOTAL) BY MOUTH DAILY AT NOON., Disp: 90 tablet, Rfl: 0   FUROSCIX 80 MG/10ML CTKT, Inject into the skin., Disp: , Rfl:    glucose blood (TRUE METRIX BLOOD GLUCOSE TEST) test strip, Use as instructed. Check blood glucose level by fingerstick twice per day., Disp: 100 each, Rfl: 12   hydrALAZINE (APRESOLINE) 50 MG tablet, Take 1 tablet (50 mg total) by mouth 3 (three) times daily., Disp: 180 tablet, Rfl: 3   Ipratropium-Albuterol (COMBIVENT RESPIMAT) 20-100 MCG/ACT AERS respimat, Inhale 1 puff into the lungs every 6 (six) hours., Disp: 4 g, Rfl: 5   losartan (COZAAR) 50 MG tablet, Take 1 tablet (50 mg total) by mouth daily., Disp: 90 tablet, Rfl: 3   LUCIRA CHECK IT COVID-19 TEST KIT, See admin instructions., Disp: , Rfl:    magnesium oxide (MAG-OX) 400 (240 Mg) MG tablet, Take 1 tablet (400 mg total) by mouth 2 (two) times daily., Disp: 90 tablet, Rfl: 3   metFORMIN (GLUCOPHAGE) 500 MG tablet, Take 1 tablet (500 mg total) by mouth 2 (two) times daily with a meal., Disp: 180 tablet, Rfl: 0   metolazone (ZAROXOLYN) 2.5 MG tablet, Take only as directed by CHF clinic., Disp: 10 tablet, Rfl: 0   omeprazole (PRILOSEC) 20 MG capsule, TAKE 1 CAPSULE 2 (TWO) TIMES DAILY BEFORE A MEAL. TAKE FIRST CAPSULE 30 MIN PRIOR TO EATING OR TAKING OTHER MEDICATIONS. (AM+BEDTIME), Disp: 180 capsule, Rfl: 1   potassium chloride (KLOR-CON) 10 MEQ tablet, Take 8 tablets (80 mEq total) by mouth daily., Disp: 240 tablet, Rfl: 11    rivaroxaban (XARELTO) 20 MG TABS tablet, Take 1 tablet (20 mg total) by mouth daily with supper., Disp: 90 tablet, Rfl: 3   spironolactone (ALDACTONE) 50 MG tablet, Take 1 tablet (50 mg total) by mouth daily., Disp: 30 tablet, Rfl: 11   STOOL SOFTENER 100 MG capsule, TAKE 1 CAPSULE BY MOUTH 2 (TWO) TIMES DAILY (NOON+BEDTIME), Disp: 60 capsule, Rfl: 0   tamsulosin (FLOMAX) 0.4 MG CAPS capsule, Take 0.4 mg by mouth daily after supper., Disp: , Rfl:    torsemide (DEMADEX) 20 MG tablet, Take 2 tablets (40 mg total) by mouth 2 (two) times daily., Disp: 60 tablet, Rfl: 11   TRUEplus Lancets 28G MISC, Use as instructed. Check blood glucose level by fingerstick twice per day., Disp: 200 each, Rfl: 3 No Known Allergies   Social History   Socioeconomic History   Marital status: Single    Spouse name:  Not on file   Number of children: Not on file   Years of education: Not on file   Highest education level: Not on file  Occupational History   Not on file  Tobacco Use   Smoking status: Former   Smokeless tobacco: Never   Tobacco comments:    smoked weed in the past  Vaping Use   Vaping Use: Never used  Substance and Sexual Activity   Alcohol use: Not Currently   Drug use: Yes    Types: Marijuana, Cocaine   Sexual activity: Not Currently  Other Topics Concern   Not on file  Social History Narrative   Not on file   Social Determinants of Health   Financial Resource Strain: High Risk (02/23/2022)   Overall Financial Resource Strain (CARDIA)    Difficulty of Paying Living Expenses: Hard  Food Insecurity: No Food Insecurity (08/03/2021)   Hunger Vital Sign    Worried About Running Out of Food in the Last Year: Never true    Ran Out of Food in the Last Year: Never true  Transportation Needs: No Transportation Needs (05/31/2021)   PRAPARE - Hydrologist (Medical): No    Lack of Transportation (Non-Medical): No  Physical Activity: Not on file  Stress: Not on file   Social Connections: Not on file  Intimate Partner Violence: Not on file    Physical Exam      Future Appointments  Date Time Provider [place]  05/11/2022 10:00 AM [facility] PA/NP [facility] None  07/04/2022  9:10 AM [name], NP [facility] None  08/08/2022 11:15 AM Trula Slade, DPM [facility]     ACTION: Next call planned for one week

## 2022-03-30 NOTE — Patient Instructions (Signed)
[place]  [id number]!

## 2022-03-30 NOTE — Telephone Encounter (Signed)
Patient Advocate Encounter   Received notification that prior authorization for Jardiance 10 mg is required.   PA submitted on 03/30/2022 Key [id number] Status is pending

## 2022-03-30 NOTE — Progress Notes (Signed)
Assessment & Plan:  Benjamin [name] was seen today for hypertension.  Diagnoses and all orders for this visit:  Essential hypertension -     CMP14+EGFR  Need for hepatitis C screening test -     HCV Ab w Reflex to Quant PCR  Type 2 diabetes mellitus with hyperglycemia, without long-term current use of insulin ([place]) -     CMP14+EGFR -     Ambulatory referral to Ophthalmology    Patient has been counseled on age-appropriate routine health concerns for screening and prevention. These are reviewed and up-to-date. Referrals have been placed accordingly. Immunizations are up-to-date or declined.    Subjective:   Chief Complaint  Patient presents with   Hypertension   HPI Benjamin [name] 56 y.o. male presents to office today for follow up to  HTN. He is being followed by the paramedicine program for Heart Failure and states he is doing well today. Weight is down several pounds today.  He has a PMH  of chronic diastolic CHF (EF 68-127) , HTN, DM, Atrial fib, PE in April 2020 (on Xarelto), OSA, GERD, GI bleed due to hemorrhoids, Anemia, chronic elevation of troponin, OHS/OSA: chronic O2 2L during day and BIPAP at night,     HTN Blood pressure device ordered through [facility] for him today.  Current meds include Cardizem 360 mg daily, Cardura 2 mg daily hydralazine 50 mg 3 times daily, losartan 50 mg daily, spironolactone 50 mg daily and torsemide 40 mg twice daily BP Readings from Last 3 Encounters:  03/30/22 140/82  03/30/22 136/88  03/11/22 134/70     DM 2 Diabetes is well controlled with metformin 500 mg twice daily and Jardiance 10 mg daily.  He had a recent appointment with podiatry for diabetic shoes on 02-04-2022.  Prescription for diabetic shoes as well as ankle brace were provided to the patient.  [facility]  Lab Results  Component Value Date   HGBA1C 6.7 (H) 02/16/2022    Lab Results  Component Value Date   LDLCALC 65 02/16/2022    Review  of Systems  Constitutional:  Negative for fever, malaise/fatigue and weight loss.  HENT: Negative.  Negative for nosebleeds.   Eyes: Negative.  Negative for blurred vision, double vision and photophobia.  Respiratory: Negative.  Negative for cough and shortness of breath.   Cardiovascular: Negative.  Negative for chest pain, palpitations and leg swelling.  Gastrointestinal: Negative.  Negative for heartburn, nausea and vomiting.  Musculoskeletal: Negative.  Negative for myalgias.  Neurological: Negative.  Negative for dizziness, focal weakness, seizures and headaches.  Psychiatric/Behavioral: Negative.  Negative for suicidal ideas.     Past Medical History:  Diagnosis Date   A-fib ([place])    Anemia 05/29/2019   CHF (congestive heart failure) (HCC)    Chronic respiratory failure (HCC)    Diabetes mellitus without complication (HCC)    Dyspnea    Elevated troponin 02/06/2019   Hypertension    Obesity    Pulmonary embolism (HCC)    Rectal bleeding 05/29/2019   Sleep apnea     Past Surgical History:  Procedure Laterality Date   CARDIOVERSION N/A 08/21/2020   Procedure: CARDIOVERSION;  Surgeon: Larey Dresser, MD;  Location: [place];  Service: Cardiovascular;  Laterality: N/A;   COLONOSCOPY WITH PROPOFOL Left 02/25/2019   Procedure: COLONOSCOPY WITH PROPOFOL;  Surgeon: Arta Silence, MD;  Location: [facility];  Service: Endoscopy;  Laterality: Left;   ESOPHAGOGASTRODUODENOSCOPY (EGD) WITH PROPOFOL Left 02/25/2019  Procedure: ESOPHAGOGASTRODUODENOSCOPY (EGD) WITH PROPOFOL;  Surgeon: Arta Silence, MD;  Location: [facility] ENDOSCOPY;  Service: Endoscopy;  Laterality: Left;   ESOPHAGOGASTRODUODENOSCOPY (EGD) WITH PROPOFOL N/A 06/12/2019   Procedure: ESOPHAGOGASTRODUODENOSCOPY (EGD) WITH PROPOFOL;  Surgeon: Clarene Essex, MD;  Location: [place];  Service: Gastroenterology;  Laterality: N/A;   FLEXIBLE SIGMOIDOSCOPY N/A 06/12/2019   Procedure: FLEXIBLE SIGMOIDOSCOPY;  Surgeon: Clarene Essex, MD;  Location: [place];  Service: Gastroenterology;  Laterality: N/A;   HEMORRHOID BANDING  05/2019   NO PAST SURGERIES     RIGHT HEART CATH N/A 04/17/2019   Procedure: RIGHT HEART CATH;  Surgeon: Larey Dresser, MD;  Location: [place] [facility];  Service: Cardiovascular;  Laterality: N/A;   TEE WITHOUT CARDIOVERSION N/A 08/21/2020   Procedure: TRANSESOPHAGEAL ECHOCARDIOGRAM (TEE);  Surgeon: Larey Dresser, MD;  Location: [facility] ENDOSCOPY;  Service: Cardiovascular;  Laterality: N/A;    Family History  Problem Relation Age of Onset   Hypertension Mother    Diabetes Mother     Social History Reviewed with no changes to be made today.   Outpatient Medications Prior to Visit  Medication Sig Dispense Refill   albuterol (PROVENTIL) (2.5 MG/3ML) 0.083% nebulizer solution Take 3 mLs (2.5 mg total) by nebulization every 6 (six) hours as needed for wheezing or shortness of breath. 150 mL 1   atorvastatin (LIPITOR) 40 MG tablet Take 1 tablet (40 mg total) by mouth at bedtime. 90 tablet 3   Blood Glucose Monitoring Suppl (TRUE METRIX METER) w/Device KIT Use as instructed. Check blood glucose level by fingerstick twice per day. 1 kit 0   Blood Pressure Monitor DEVI Please provide patient with insurance approved blood pressure device with L-XL cuff. BMI 55 1 each 0   carboxymethylcellulose (REFRESH PLUS) 0.5 % SOLN Place 1 drop into both eyes daily.     diltiazem (CARDIZEM CD) 360 MG 24 hr capsule Take 1 capsule (360 mg total) by mouth daily. 90 capsule 3   doxazosin (CARDURA) 2 MG tablet Take 1 tablet (2 mg total) by mouth daily. 30 tablet 11   ferrous sulfate (FEROSUL) 325 (65 FE) MG tablet TAKE 1 TABLET (325 MG TOTAL) BY MOUTH DAILY AT NOON. 90 tablet 0   FUROSCIX 80 MG/10ML CTKT Inject into the skin.     glucose blood (TRUE METRIX BLOOD GLUCOSE TEST) test strip Use as instructed. Check blood glucose level by fingerstick twice per day. 100 each 12   hydrALAZINE (APRESOLINE) 50 MG tablet  Take 1 tablet (50 mg total) by mouth 3 (three) times daily. 180 tablet 3   Ipratropium-Albuterol (COMBIVENT RESPIMAT) 20-100 MCG/ACT AERS respimat Inhale 1 puff into the lungs every 6 (six) hours. 4 g 5   losartan (COZAAR) 50 MG tablet Take 1 tablet (50 mg total) by mouth daily. 90 tablet 3   LUCIRA CHECK IT COVID-19 TEST KIT See admin instructions.     magnesium oxide (MAG-OX) 400 (240 Mg) MG tablet Take 1 tablet (400 mg total) by mouth 2 (two) times daily. 90 tablet 3   metFORMIN (GLUCOPHAGE) 500 MG tablet Take 1 tablet (500 mg total) by mouth 2 (two) times daily with a meal. 180 tablet 0   metolazone (ZAROXOLYN) 2.5 MG tablet Take only as directed by CHF clinic. 10 tablet 0   Misc. Devices MISC Requires O2 @ 3L/min continuously via nasal canula and home fill system,. 1 each 0   Misc. Devices MISC Please provide Mr. Coats with insurance approved portable O2 concentrator ICD 10 J96.11 Z99.81  1 each 0   Misc. Devices MISC Please provide patient with insurance approved LARGE right hand splint ICD 10 G56.01 1 each 0   Misc. Devices MISC Please provide patient wit insurance approved diabetic custom molded shoes (QTY 2) and custom molded multi density inserts (1 for each shoe). 1 each 0   omeprazole (PRILOSEC) 20 MG capsule TAKE 1 CAPSULE 2 (TWO) TIMES DAILY BEFORE A MEAL. TAKE FIRST CAPSULE 30 MIN PRIOR TO EATING OR TAKING OTHER MEDICATIONS. (AM+BEDTIME) 180 capsule 1   potassium chloride (KLOR-CON) 10 MEQ tablet Take 8 tablets (80 mEq total) by mouth daily. 240 tablet 11   rivaroxaban (XARELTO) 20 MG TABS tablet Take 1 tablet (20 mg total) by mouth daily with supper. 90 tablet 3   spironolactone (ALDACTONE) 50 MG tablet Take 1 tablet (50 mg total) by mouth daily. 30 tablet 11   STOOL SOFTENER 100 MG capsule TAKE 1 CAPSULE BY MOUTH 2 (TWO) TIMES DAILY (NOON+BEDTIME) 60 capsule 0   tamsulosin (FLOMAX) 0.4 MG CAPS capsule Take 0.4 mg by mouth daily after supper.     torsemide (DEMADEX) 20 MG tablet  Take 2 tablets (40 mg total) by mouth 2 (two) times daily. 60 tablet 11   TRUEplus Lancets 28G MISC Use as instructed. Check blood glucose level by fingerstick twice per day. 200 each 3   No facility-administered medications prior to visit.    No Known Allergies     Objective:    BP 136/88 (BP Location: Left Arm, Patient Position: Sitting, Cuff Size: Large)   Pulse 92   Temp 99.1 F (37.3 C) (Oral)   Resp 16   Wt (!) 350 lb (158.8 kg)   SpO2 92%   PF (!) 2 L/min   BMI 50.22 kg/m  Wt Readings from Last 3 Encounters:  03/30/22 (!) 358 lb 6.4 oz (162.6 kg)  03/30/22 (!) 350 lb (158.8 kg)  03/11/22 (!) 355 lb 6.4 oz (161.2 kg)    Physical Exam Vitals and nursing note reviewed.  Constitutional:      Appearance: He is well-developed.  HENT:     Head: Normocephalic and atraumatic.  Cardiovascular:     Rate and Rhythm: Normal rate and regular rhythm.     Heart sounds: Normal heart sounds. No murmur heard.    No friction rub. No gallop.  Pulmonary:     Effort: Pulmonary effort is normal. No tachypnea or respiratory distress.     Breath sounds: Normal breath sounds. No decreased breath sounds, wheezing, rhonchi or rales.  Chest:     Chest wall: No tenderness.  Abdominal:     General: Bowel sounds are normal.     Palpations: Abdomen is soft.  Musculoskeletal:        General: Normal range of motion.     Cervical back: Normal range of motion.  Skin:    General: Skin is warm and dry.  Neurological:     Mental Status: He is alert and oriented to person, place, and time.     Coordination: Coordination normal.  Psychiatric:        Behavior: Behavior normal. Behavior is cooperative.        Thought Content: Thought content normal.        Judgment: Judgment normal.          Patient has been counseled extensively about nutrition and exercise as well as the importance of adherence with medications and regular follow-up. The patient was given clear instructions to go to ER or  return to medical center if symptoms don't improve, worsen or new problems develop. The patient verbalized understanding.   Follow-up: Return for virtual visit with [facility] or 130 for diabetic shoes any day. See me in 3 months.   [name], FNP-BC [facility] [place], [place]   03/30/2022, 2:20 PM

## 2022-03-31 LAB — CMP14+EGFR
ALT: 16 IU/L (ref 0–44)
AST: 19 IU/L (ref 0–40)
Albumin/Globulin Ratio: 1.4 (ref 1.2–2.2)
Albumin: 4.2 g/dL (ref 3.8–4.9)
Alkaline Phosphatase: 61 IU/L (ref 44–121)
BUN/Creatinine Ratio: 10 (ref 9–20)
BUN: 14 mg/dL (ref 6–24)
Bilirubin Total: <0.2 mg/dL (ref 0.0–1.2)
CO2: 26 mmol/L (ref 20–29)
Calcium: 9.6 mg/dL (ref 8.7–10.2)
Chloride: 99 mmol/L (ref 96–106)
Creatinine, Ser: 1.34 mg/dL — ABNORMAL HIGH (ref 0.76–1.27)
Globulin, Total: 3 g/dL (ref 1.5–4.5)
Glucose: 96 mg/dL (ref 70–99)
Potassium: 5 mmol/L (ref 3.5–5.2)
Sodium: 141 mmol/L (ref 134–144)
Total Protein: 7.2 g/dL (ref 6.0–8.5)
eGFR: 62 mL/min/{1.73_m2} (ref >59)

## 2022-03-31 LAB — HCV INTERPRETATION

## 2022-03-31 LAB — HCV AB W REFLEX TO QUANT PCR: HCV Ab: NONREACTIVE

## 2022-04-01 ENCOUNTER — Other Ambulatory Visit (HOSPITAL_COMMUNITY): Payer: Self-pay

## 2022-04-01 DIAGNOSIS — J9611 Chronic respiratory failure with hypoxia: Secondary | ICD-10-CM | POA: Diagnosis not present

## 2022-04-01 DIAGNOSIS — I5032 Chronic diastolic (congestive) heart failure: Secondary | ICD-10-CM | POA: Diagnosis not present

## 2022-04-01 NOTE — Telephone Encounter (Signed)
Patient Advocate Encounter  Prior Authorization for Jardiance 10 mg  has been approved.    PA# [id number] Effective dates: 03/30/2022 through 03/30/2023  Patients co-pay is $4.

## 2022-04-05 ENCOUNTER — Telehealth: Payer: Medicaid Other | Admitting: Nurse Practitioner

## 2022-04-07 ENCOUNTER — Telehealth (HOSPITAL_COMMUNITY): Payer: Self-pay

## 2022-04-07 DIAGNOSIS — F4311 Post-traumatic stress disorder, acute: Secondary | ICD-10-CM | POA: Diagnosis not present

## 2022-04-07 NOTE — Telephone Encounter (Signed)
Left message for Benjamin [name] to return call to set up paramedicine home visit. I will attempt to reach him again next week.   Maralyn Sago, EMT-Paramedic [phone] 04/07/2022

## 2022-04-11 ENCOUNTER — Other Ambulatory Visit: Payer: Self-pay | Admitting: Family Medicine

## 2022-04-11 ENCOUNTER — Other Ambulatory Visit (HOSPITAL_COMMUNITY): Payer: Self-pay

## 2022-04-11 ENCOUNTER — Telehealth (HOSPITAL_COMMUNITY): Payer: Self-pay | Admitting: *Deleted

## 2022-04-11 ENCOUNTER — Other Ambulatory Visit: Payer: Self-pay | Admitting: Nurse Practitioner

## 2022-04-11 ENCOUNTER — Other Ambulatory Visit (HOSPITAL_COMMUNITY): Payer: Self-pay | Admitting: *Deleted

## 2022-04-11 ENCOUNTER — Telehealth (HOSPITAL_COMMUNITY): Payer: Self-pay

## 2022-04-11 DIAGNOSIS — R1084 Generalized abdominal pain: Secondary | ICD-10-CM

## 2022-04-11 DIAGNOSIS — J9611 Chronic respiratory failure with hypoxia: Secondary | ICD-10-CM

## 2022-04-11 MED ORDER — METOLAZONE 2.5 MG PO TABS: ORAL_TABLET | ORAL | 0 refills | Status: DC

## 2022-04-11 NOTE — Telephone Encounter (Signed)
Reached out to HF clinic due to [facility] reaching out to me via telephone stating he was having some increased shortness of breath with swelling and weight gain over the weekend. He explained [name] never sent him bubble packs on Friday 7/21. I contacted HF clinic and made them aware- Prince Rome NP advised Audric should take 2.5mg  Metolazone today with Potassium in addition to his normal meds.   I contacted [name] and they report they will have one week of meds ready by 1000 today for pick up as they were awaiting refills from PCP to fill.   I picked up from [name] and had home visit with Oswin. Home visit to be noted in Patient Outreach.   Call complete.   Maralyn Sago, EMT-Paramedic [phone] 04/11/2022

## 2022-04-11 NOTE — Telephone Encounter (Signed)
Maralyn Sago community paramedic aware.

## 2022-04-11 NOTE — Telephone Encounter (Signed)
Heather w/paramedicine called to report pts weight is up 8lbs from last week and he is more sob. Pt has been without meds since Friday but Herbert Seta said he will restart today. The pharmacy was waiting on two meds from pcp to fill his bubble pack and did not ship his meds out over the weekend. Heather asked if pt needs to take additional meds like metolazone. Herbert Seta has a visit with pt today around noon.   Routed to [employer] for advice

## 2022-04-11 NOTE — Telephone Encounter (Signed)
Requested medication (s) are due for refill today: yes  Requested medication (s) are on the active medication list: yes  Last refill:  12/30/21 #90/0  Future visit scheduled: yes  Notes to clinic:  Unable to refill per protocol due to failed labs, no updated results.    Requested Prescriptions  Pending Prescriptions Disp Refills   FEROSUL 325 (65 Fe) MG tablet [Pharmacy Med Name: FEROSUL 325 (65 FE) MG ORAL TABLET] 90 tablet 0    Sig: TAKE 1 TABLET (325 MG TOTAL) BY MOUTH DAILY AT NOON.     Endocrinology:  Minerals - Iron Supplementation Failed - 04/11/2022  9:13 AM      Failed - Fe (serum) in normal range and within 360 days    Iron  Date Value Ref Range Status  05/30/2019 14 (L) 45 - 182 ug/dL Final   Saturation Ratios  Date Value Ref Range Status  05/30/2019 4 (L) 17.9 - 39.5 % Final         Failed - Ferritin in normal range and within 360 days    Ferritin  Date Value Ref Range Status  05/30/2019 13 (L) 24 - 336 ng/mL Final    Comment:    Performed at [facility], [address], [place], [place] [address]         Passed - HGB in normal range and within 360 days    Hemoglobin  Date Value Ref Range Status  02/16/2022 13.7 13.0 - 17.0 g/dL Final  25/00/3704 88.8 (L) 13.0 - 17.7 g/dL Final   Total hemoglobin  Date Value Ref Range Status  08/02/2019 10.1 (L) 12.0 - 16.0 g/dL Final         Passed - HCT in normal range and within 360 days    HCT  Date Value Ref Range Status  02/16/2022 45.3 39.0 - 52.0 % Final   Hematocrit  Date Value Ref Range Status  12/21/2020 39.9 37.5 - 51.0 % Final         Passed - RBC in normal range and within 360 days    RBC  Date Value Ref Range Status  02/16/2022 5.56 4.22 - 5.81 MIL/uL Final         Passed - Valid encounter within last 12 months    Recent Outpatient Visits           1 week ago Essential hypertension   [facility] [place], Shea Stakes, NP   1 year ago Essential hypertension    [facility] [place], Shea Stakes, NP   1 year ago Wound of abdomen   [facility] [place], Shea Stakes, NP   1 year ago Hospital discharge follow-up   [facility] Claiborne Rigg, NP   1 year ago Essential hypertension   [facility] Claiborne Rigg, NP       Future Appointments             In 2 months Claiborne Rigg, NP [facility]

## 2022-04-11 NOTE — Progress Notes (Signed)
Paramedicine Encounter    Patient ID: Benjamin [name], male    DOB: Jan 24, 1966, 56 y.o.   MRN: [id number]  Arrived for home visit for Bear who had called me several times this morning and over the weekend reporting some fluid retention due to him being without his medications in which his pharmacy did not deliver.   He explained [name] never sent him bubble packs on Friday 7/21. I contacted HF clinic and made them aware- Allena Katz NP advised [name] should take 2.19m Metolazone today with 40MEQ Potassium in addition to his normal meds.   I picked up one week of bubble packs as well as metolazone and potassium dose for today.   I gave TStephenshis morning meds and the metolazone and potassium during our visit and he successfully took same.   WT- 365lbs (up 7 lbs from last week) BP- 150/80 HR- 87 O2- 96% RR- 22  Lung sounds- wheezing with some rhonchi. Swelling- abdomen and per patient (groin)  He has been using his CPAP and says he has had limited urine output since Wednesday which is when he took his last dose of medications.    TAzamdenied chest pain or dizziness and says he thinks once he takes his meds he will feel better, I advised him if the symptoms worsen and he doesn't have any improvement to call me or EMS if needed. He agreed.   Home visit complete, I plan to follow up via telephone later today.   1745- Spoke to TColgate-Palmolivewho reports he is feeling better and has been urinating since taking Metolazone and other morning meds. I will check in again tomorrow via phone to ensure he is continuing to improve.    [name] ensured they would deliver remaining 3 weeks of bubble packs once refills are sent in by PCP. I will follow up at the end of the week for same.     HSalena Saner [name]7/24/2023     Patient Care Team: FGildardo Pounds NP as PCP - General (Nurse Practitioner) RFay Records MD as PCP - Cardiology  (Cardiology) UJorge Ny LCSW as Social Worker (Licensed Clinical Social Worker)  Patient Active Problem List   Diagnosis Date Noted   Atrial fibrillation with RVR ([place] 01/08/2021   Acute respiratory failure ([place] 12/24/2020   Obstructive sleep apnea treated with BiPAP 12/24/2020   Polysubstance abuse ([place] 08/20/2020   Alcohol abuse 08/20/2020   Atrial fibrillation with rapid ventricular response ([place] 08/19/2020   Obesity hypoventilation syndrome ([place] 06/12/2020   Healthcare maintenance 06/12/2020   Acute on chronic diastolic heart failure ([place] 02/24/2020   Diabetic neuropathy (HE[place] 11/20/2019   Coagulation disorder ([place] 11/20/2019   Acute on chronic diastolic CHF (congestive heart failure), NYHA class 3 ([place] 07/30/2019   Diabetes mellitus without complication (H[place] 138/88/2800  Hypertensive emergency    BRBPR (bright red blood per rectum) 06/12/2019   Hypertension    CHF (congestive heart failure) (HP[place]    Sleep apnea    Chronic respiratory failure (HP[place]    Hypokalemia 05/29/2019   GI bleed 05/29/2019   Anemia 05/28/2019   Iron deficiency anemia 03/11/2019   OSA on CPAP 03/11/2019   HLD (hyperlipidemia) 03/11/2019   Elevated troponin 03/11/2019   GERD (gastroesophageal reflux disease) 03/11/2019   Rectal bleeding 02/20/2019   Dyspnea 02/06/2019   COVID-19 virus infection 02/06/2019   Bilateral lower extremity edema    Morbid obesity with BMI of 50.0-59.9, adult (H[place]  PAF (paroxysmal atrial fibrillation) (HCC)    PE (pulmonary thromboembolism) ([place]) 01/21/2019   Hypertensive urgency 01/21/2019   Diabetes mellitus type 2 in obese ([place]) 01/21/2019    Current Outpatient Medications:    albuterol (PROVENTIL) (2.5 MG/3ML) 0.083% nebulizer solution, Take 3 mLs (2.5 mg total) by nebulization every 6 (six) hours as needed for wheezing or shortness of breath., Disp: 150 mL, Rfl: 1   atorvastatin (LIPITOR) 40 MG tablet, Take 1 tablet (40 mg total) by mouth at bedtime.,  Disp: 90 tablet, Rfl: 3   Blood Glucose Monitoring Suppl (TRUE METRIX METER) w/Device KIT, Use as instructed. Check blood glucose level by fingerstick twice per day., Disp: 1 kit, Rfl: 0   Blood Pressure Monitor DEVI, Please provide patient with insurance approved blood pressure device with L-XL cuff. BMI 55, Disp: 1 each, Rfl: 0   carboxymethylcellulose (REFRESH PLUS) 0.5 % SOLN, Place 1 drop into both eyes daily., Disp: , Rfl:    diltiazem (CARDIZEM CD) 360 MG 24 hr capsule, Take 1 capsule (360 mg total) by mouth daily., Disp: 90 capsule, Rfl: 3   doxazosin (CARDURA) 2 MG tablet, Take 1 tablet (2 mg total) by mouth daily., Disp: 30 tablet, Rfl: 11   empagliflozin (JARDIANCE) 10 MG TABS tablet, Take 1 tablet (10 mg total) by mouth daily before breakfast., Disp: 30 tablet, Rfl: 6   ferrous sulfate (FEROSUL) 325 (65 FE) MG tablet, TAKE 1 TABLET (325 MG TOTAL) BY MOUTH DAILY AT NOON., Disp: 90 tablet, Rfl: 0   FUROSCIX 80 MG/10ML CTKT, Inject into the skin., Disp: , Rfl:    glucose blood (TRUE METRIX BLOOD GLUCOSE TEST) test strip, Use as instructed. Check blood glucose level by fingerstick twice per day., Disp: 100 each, Rfl: 12   hydrALAZINE (APRESOLINE) 50 MG tablet, Take 1 tablet (50 mg total) by mouth 3 (three) times daily., Disp: 180 tablet, Rfl: 3   Ipratropium-Albuterol (COMBIVENT RESPIMAT) 20-100 MCG/ACT AERS respimat, Inhale 1 puff into the lungs every 6 (six) hours., Disp: 4 g, Rfl: 5   losartan (COZAAR) 50 MG tablet, Take 1 tablet (50 mg total) by mouth daily., Disp: 90 tablet, Rfl: 3   LUCIRA CHECK IT COVID-19 TEST KIT, See admin instructions., Disp: , Rfl:    magnesium oxide (MAG-OX) 400 (240 Mg) MG tablet, Take 1 tablet (400 mg total) by mouth 2 (two) times daily., Disp: 90 tablet, Rfl: 3   metFORMIN (GLUCOPHAGE) 500 MG tablet, Take 1 tablet (500 mg total) by mouth 2 (two) times daily with a meal., Disp: 180 tablet, Rfl: 0   metolazone (ZAROXOLYN) 2.5 MG tablet, Take only as directed by  CHF clinic. Take with 21mq of potassium., Disp: 10 tablet, Rfl: 0   omeprazole (PRILOSEC) 20 MG capsule, TAKE 1 CAPSULE 2 (TWO) TIMES DAILY BEFORE A MEAL. TAKE FIRST CAPSULE 30 MIN PRIOR TO EATING OR TAKING OTHER MEDICATIONS. (AM+BEDTIME), Disp: 180 capsule, Rfl: 1   potassium chloride (KLOR-CON) 10 MEQ tablet, Take 8 tablets (80 mEq total) by mouth daily., Disp: 240 tablet, Rfl: 11   rivaroxaban (XARELTO) 20 MG TABS tablet, Take 1 tablet (20 mg total) by mouth daily with supper., Disp: 90 tablet, Rfl: 3   spironolactone (ALDACTONE) 50 MG tablet, Take 1 tablet (50 mg total) by mouth daily., Disp: 30 tablet, Rfl: 11   STOOL SOFTENER 100 MG capsule, TAKE 1 CAPSULE BY MOUTH 2 (TWO) TIMES DAILY (NOON+BEDTIME), Disp: 60 capsule, Rfl: 0   tamsulosin (FLOMAX) 0.4 MG CAPS capsule, Take 0.4 mg by  mouth daily after supper., Disp: , Rfl:    torsemide (DEMADEX) 20 MG tablet, Take 2 tablets (40 mg total) by mouth 2 (two) times daily., Disp: 60 tablet, Rfl: 11   TRUEplus Lancets 28G MISC, Use as instructed. Check blood glucose level by fingerstick twice per day., Disp: 200 each, Rfl: 3 No Known Allergies   Social History   Socioeconomic History   Marital status: Single    Spouse name: Not on file   Number of children: Not on file   Years of education: Not on file   Highest education level: Not on file  Occupational History   Not on file  Tobacco Use   Smoking status: Former   Smokeless tobacco: Never   Tobacco comments:    smoked weed in the past  Vaping Use   Vaping Use: Never used  Substance and Sexual Activity   Alcohol use: Not Currently   Drug use: Yes    Types: Marijuana, Cocaine   Sexual activity: Not Currently  Other Topics Concern   Not on file  Social History Narrative   Not on file   Social Determinants of Health   Financial Resource Strain: High Risk (02/23/2022)   Overall Financial Resource Strain (CARDIA)    Difficulty of Paying Living Expenses: Hard  Food Insecurity: No  Food Insecurity (08/03/2021)   Hunger Vital Sign    Worried About Running Out of Food in the Last Year: Never true    Ran Out of Food in the Last Year: Never true  Transportation Needs: No Transportation Needs (05/31/2021)   PRAPARE - Hydrologist (Medical): No    Lack of Transportation (Non-Medical): No  Physical Activity: Not on file  Stress: Not on file  Social Connections: Not on file  Intimate Partner Violence: Not on file    Physical Exam      Future Appointments  Date Time Provider [place]  05/11/2022 10:00 AM [facility] PA/NP [facility] None  07/04/2022  9:10 AM [name], NP [facility] None  08/08/2022 11:15 AM Trula Slade, DPM [facility]     ACTION: Home visit completed

## 2022-04-12 ENCOUNTER — Telehealth (HOSPITAL_COMMUNITY): Payer: Self-pay

## 2022-04-12 NOTE — Telephone Encounter (Signed)
Requested Prescriptions  Pending Prescriptions Disp Refills  . omeprazole (PRILOSEC) 20 MG capsule [Pharmacy Med Name: OMEPRAZOLE 20 MG ORAL CAPSULE DELAYED RELEASE] 180 capsule 1    Sig: TAKE 1 CAPSULE 2 (TWO) TIMES DAILY BEFORE A MEAL. TAKE FIRST CAPSULE 30 MIN PRIOR TO EATING OR TAKING OTHER MEDICATIONS. (AM+BEDTIME)     Gastroenterology: Proton Pump Inhibitors Passed - 04/11/2022  9:20 AM      Passed - Valid encounter within last 12 months    Recent Outpatient Visits          1 week ago Essential hypertension   [place] [employer], Shea Stakes, NP   1 year ago Essential hypertension   [facility] [place], Shea Stakes, NP   1 year ago Wound of abdomen   [facility] Claiborne Rigg, NP   1 year ago Hospital discharge follow-up   [facility] Claiborne Rigg, NP   1 year ago Essential hypertension   [facility] Claiborne Rigg, NP      Future Appointments            In 2 months Claiborne Rigg, NP [employer] [facility]           . albuterol (PROVENTIL) (2.5 MG/3ML) 0.083% nebulizer solution [Pharmacy Med Name: ALBUTEROL SULFATE (2.5 MG/3ML) 0.083% INHALATION NEBULIZATION SOLUTION] 150 mL 1    Sig: INHALE 1 VIAL BY NEBULIZATION EVERY 6 (SIX) HOURS AS NEEDED FOR WHEEZING OR SHORTNESS OF BREATH.     Pulmonology:  Beta Agonists 2 Failed - 04/11/2022  9:20 AM      Failed - Last BP in normal range    BP Readings from Last 1 Encounters:  04/11/22 (!) 150/80         Passed - Last Heart Rate in normal range    Pulse Readings from Last 1 Encounters:  04/11/22 87         Passed - Valid encounter within last 12 months    Recent Outpatient Visits          1 week ago Essential hypertension   [facility] [place], Shea Stakes, NP   1 year ago Essential hypertension   [facility] [place], Shea Stakes, NP   1 year ago Wound of abdomen   [facility] [place], Shea Stakes, NP   1 year ago Hospital discharge follow-up   [facility] Claiborne Rigg, NP   1 year ago Essential hypertension   [facility] Claiborne Rigg, NP      Future Appointments            In 2 months Claiborne Rigg, NP [facility]

## 2022-04-12 NOTE — Telephone Encounter (Signed)
Spoke to Benjamin [name] who reports he is feeling better today after taking the Metolazone yesterday and restarting his bubble packs. He says his breathing has improved, which he sounded much better on the phone today than yesterday. He also reports the swelling in his groin and stomach went down. He reports increased urine output over the last 24 hours also. I advised him to reach out if he needed and I would follow up in the home next week. He agreed. Call complete.   Maralyn Sago, EMT-Paramedic [phone] 04/12/2022

## 2022-04-15 ENCOUNTER — Other Ambulatory Visit (HOSPITAL_COMMUNITY): Payer: Self-pay

## 2022-04-15 DIAGNOSIS — E782 Mixed hyperlipidemia: Secondary | ICD-10-CM

## 2022-04-15 MED ORDER — ATORVASTATIN CALCIUM 40 MG PO TABS: 40.0000 mg | ORAL_TABLET | Freq: Every day | ORAL | 3 refills | Status: DC

## 2022-04-18 ENCOUNTER — Telehealth (HOSPITAL_COMMUNITY): Payer: Self-pay

## 2022-04-18 ENCOUNTER — Other Ambulatory Visit (HOSPITAL_COMMUNITY): Payer: Self-pay

## 2022-04-18 NOTE — Progress Notes (Signed)
Paramedicine Encounter    Patient ID: Benjamin [name], male    DOB: June 01, 1966, 57 y.o.   MRN: [id number]  Arrived for home visit for Benjamin [name] who reports feeling good and denying shortness of breath or chest pain this week. He says he has had much improvement since last week taking the metolazone and his normal meds.   Vitals obtained and as noted. Lungs sounds clear, no lower leg swelling noted.   We reviewed appointments and confirmed same.   We called [facility] for his diabetic shoes and they report they never received orders for this. I will forward that notice to his PCP and the podiatrist.    He is wanting to restart PREP program. I will reach out to Benjamin Sours LCSW for same.   He is also wanting some tall compression socks- I will see if HF clinic is able to send in orders for same.   Benjamin [name] reports he has not used drugs in over a month and he says he intends to not use as he wants to make sure he stays on track. I encouraged him to stay on track.   [name] delivered rest of bubble packs successfully.   Home visit complete- I will see Benjamin [name] in two weeks. He knows to call me if needed.   Benjamin [name], [name] 04/18/2022        Patient Care Team: Benjamin Pounds, NP as PCP - General (Nurse Practitioner) Benjamin Records, MD as PCP - Cardiology (Cardiology) Benjamin Ny, LCSW as Social Worker (Licensed Clinical Social Worker)  Patient Active Problem List   Diagnosis Date Noted   Atrial fibrillation with RVR ([place]) 01/08/2021   Acute respiratory failure ([place]) 12/24/2020   Obstructive sleep apnea treated with BiPAP 12/24/2020   Polysubstance abuse ([place]) 08/20/2020   Alcohol abuse 08/20/2020   Atrial fibrillation with rapid ventricular response ([place]) 08/19/2020   Obesity hypoventilation syndrome ([place]) 06/12/2020   Healthcare maintenance 06/12/2020   Acute on chronic diastolic heart failure ([place]) 02/24/2020   Diabetic  neuropathy ([place]) 11/20/2019   Coagulation disorder ([place]) 11/20/2019   Acute on chronic diastolic CHF (congestive heart failure), NYHA class 3 ([place]) 07/30/2019   Diabetes mellitus without complication ([place]) 24/46/2863   Hypertensive emergency    BRBPR (bright red blood per rectum) 06/12/2019   Hypertension    CHF (congestive heart failure) ([place])    Sleep apnea    Chronic respiratory failure ([place])    Hypokalemia 05/29/2019   GI bleed 05/29/2019   Anemia 05/28/2019   Iron deficiency anemia 03/11/2019   OSA on CPAP 03/11/2019   HLD (hyperlipidemia) 03/11/2019   Elevated troponin 03/11/2019   GERD (gastroesophageal reflux disease) 03/11/2019   Rectal bleeding 02/20/2019   Dyspnea 02/06/2019   COVID-19 virus infection 02/06/2019   Bilateral lower extremity edema    Morbid obesity with BMI of 50.0-59.9, adult (HCC)    PAF (paroxysmal atrial fibrillation) ([place])    PE (pulmonary thromboembolism) ([place]) 01/21/2019   Hypertensive urgency 01/21/2019   Diabetes mellitus type 2 in obese ([place]) 01/21/2019    Current Outpatient Medications:    albuterol (PROVENTIL) (2.5 MG/3ML) 0.083% nebulizer solution, INHALE 1 VIAL BY NEBULIZATION EVERY 6 (SIX) HOURS AS NEEDED FOR WHEEZING OR SHORTNESS OF BREATH., Disp: 150 mL, Rfl: 1   atorvastatin (LIPITOR) 40 MG tablet, Take 1 tablet (40 mg total) by mouth at bedtime., Disp: 90 tablet, Rfl: 3   Blood Glucose Monitoring Suppl (TRUE METRIX METER) w/Device KIT, Use  as instructed. Check blood glucose level by fingerstick twice per day., Disp: 1 kit, Rfl: 0   Blood Pressure Monitor DEVI, Please provide patient with insurance approved blood pressure device with L-XL cuff. BMI 55, Disp: 1 each, Rfl: 0   carboxymethylcellulose (REFRESH PLUS) 0.5 % SOLN, Place 1 drop into both eyes daily., Disp: , Rfl:    diltiazem (CARDIZEM CD) 360 MG 24 hr capsule, Take 1 capsule (360 mg total) by mouth daily., Disp: 90 capsule, Rfl: 3   doxazosin (CARDURA) 2 MG tablet, Take 1  tablet (2 mg total) by mouth daily., Disp: 30 tablet, Rfl: 11   empagliflozin (JARDIANCE) 10 MG TABS tablet, Take 1 tablet (10 mg total) by mouth daily before breakfast., Disp: 30 tablet, Rfl: 6   FEROSUL 325 (65 Fe) MG tablet, TAKE 1 TABLET (325 MG TOTAL) BY MOUTH DAILY AT NOON., Disp: 90 tablet, Rfl: 0   FUROSCIX 80 MG/10ML CTKT, Inject into the skin., Disp: , Rfl:    glucose blood (TRUE METRIX BLOOD GLUCOSE TEST) test strip, Use as instructed. Check blood glucose level by fingerstick twice per day., Disp: 100 each, Rfl: 12   hydrALAZINE (APRESOLINE) 50 MG tablet, Take 1 tablet (50 mg total) by mouth 3 (three) times daily., Disp: 180 tablet, Rfl: 3   Ipratropium-Albuterol (COMBIVENT RESPIMAT) 20-100 MCG/ACT AERS respimat, Inhale 1 puff into the lungs every 6 (six) hours., Disp: 4 g, Rfl: 5   losartan (COZAAR) 50 MG tablet, Take 1 tablet (50 mg total) by mouth daily., Disp: 90 tablet, Rfl: 3   LUCIRA CHECK IT COVID-19 TEST KIT, See admin instructions., Disp: , Rfl:    magnesium oxide (MAG-OX) 400 (240 Mg) MG tablet, Take 1 tablet (400 mg total) by mouth 2 (two) times daily., Disp: 90 tablet, Rfl: 3   metFORMIN (GLUCOPHAGE) 500 MG tablet, Take 1 tablet (500 mg total) by mouth 2 (two) times daily with a meal., Disp: 180 tablet, Rfl: 0   metolazone (ZAROXOLYN) 2.5 MG tablet, Take only as directed by CHF clinic. Take with 97mq of potassium., Disp: 10 tablet, Rfl: 0   omeprazole (PRILOSEC) 20 MG capsule, TAKE 1 CAPSULE 2 (TWO) TIMES DAILY BEFORE A MEAL. TAKE FIRST CAPSULE 30 MIN PRIOR TO EATING OR TAKING OTHER MEDICATIONS. (AM+BEDTIME), Disp: 180 capsule, Rfl: 1   potassium chloride (KLOR-CON) 10 MEQ tablet, Take 8 tablets (80 mEq total) by mouth daily., Disp: 240 tablet, Rfl: 11   rivaroxaban (XARELTO) 20 MG TABS tablet, Take 1 tablet (20 mg total) by mouth daily with supper., Disp: 90 tablet, Rfl: 3   spironolactone (ALDACTONE) 50 MG tablet, Take 1 tablet (50 mg total) by mouth daily., Disp: 30 tablet,  Rfl: 11   STOOL SOFTENER 100 MG capsule, TAKE 1 CAPSULE BY MOUTH 2 (TWO) TIMES DAILY (NOON+BEDTIME), Disp: 60 capsule, Rfl: 0   tamsulosin (FLOMAX) 0.4 MG CAPS capsule, Take 0.4 mg by mouth daily after supper., Disp: , Rfl:    torsemide (DEMADEX) 20 MG tablet, Take 2 tablets (40 mg total) by mouth 2 (two) times daily., Disp: 60 tablet, Rfl: 11   TRUEplus Lancets 28G MISC, Use as instructed. Check blood glucose level by fingerstick twice per day., Disp: 200 each, Rfl: 3 No Known Allergies   Social History   Socioeconomic History   Marital status: Single    Spouse name: Not on file   Number of children: Not on file   Years of education: Not on file   Highest education level: Not on file  Occupational  History   Not on file  Tobacco Use   Smoking status: Former   Smokeless tobacco: Never   Tobacco comments:    smoked weed in the past  Vaping Use   Vaping Use: Never used  Substance and Sexual Activity   Alcohol use: Not Currently   Drug use: Yes    Types: Marijuana, Cocaine   Sexual activity: Not Currently  Other Topics Concern   Not on file  Social History Narrative   Not on file   Social Determinants of Health   Financial Resource Strain: High Risk (02/23/2022)   Overall Financial Resource Strain (CARDIA)    Difficulty of Paying Living Expenses: Hard  Food Insecurity: No Food Insecurity (08/03/2021)   Hunger Vital Sign    Worried About Running Out of Food in the Last Year: Never true    Ran Out of Food in the Last Year: Never true  Transportation Needs: No Transportation Needs (05/31/2021)   PRAPARE - Hydrologist (Medical): No    Lack of Transportation (Non-Medical): No  Physical Activity: Not on file  Stress: Not on file  Social Connections: Not on file  Intimate Partner Violence: Not on file    Physical Exam      Future Appointments  Date Time Provider [place]  05/11/2022 10:00 AM [facility] PA/NP [facility] None  07/04/2022   9:10 AM Benjamin Pounds, NP [facility] None  08/08/2022 11:15 AM Trula Slade, DPM [facility]     ACTION: Home visit completed

## 2022-04-18 NOTE — Telephone Encounter (Signed)
Spoke to RN at [facility] who is able to re-print orders for diabetic shoes and compression socks for Benjamin [name] as he misplaced these. I will pick them up sometime this week. They also report [facility] new patients who he will need to obtain diabetic shoes from somewhere else- they are creating a list of alternatives for patient and advised me to call back in a week to follow up.   I made [name] aware of this.   Call complete.   Maralyn Sago, EMT-Paramedic [phone] 04/18/2022

## 2022-04-22 ENCOUNTER — Telehealth (HOSPITAL_COMMUNITY): Payer: Self-pay | Admitting: Licensed Clinical Social Worker

## 2022-04-22 NOTE — Telephone Encounter (Signed)
Parole officer updated CSW that wellness check went find- pt and woman were gone from apartment complex at time of check.  CSW spoke with pt who reports that he will contact [employer] to be present with woman comes back for her belongings to avoid further conflict.  [name], LCSW Clinical Social Worker [facility] Desk#: [phone] Cell#: [phone]

## 2022-04-22 NOTE — Telephone Encounter (Signed)
CSW received call from pt this morning who seemed very agitated.  There was a woman yelling in the background and both the patient and the woman were very agitated.  CSW encouraged pt to remove himself from the situation.  CSW called pt parole officer to request wellness check at pt apartment due to concerns for patient and woman's safety- wellness check being sent out and pt informed of this.  Will continue to follow and assist as needed  [name], LCSW Clinical Social Worker [facility] Desk#: [phone] Cell#: [phone]

## 2022-05-02 DIAGNOSIS — I5032 Chronic diastolic (congestive) heart failure: Secondary | ICD-10-CM | POA: Diagnosis not present

## 2022-05-02 DIAGNOSIS — J9611 Chronic respiratory failure with hypoxia: Secondary | ICD-10-CM | POA: Diagnosis not present

## 2022-05-09 ENCOUNTER — Telehealth (HOSPITAL_COMMUNITY): Payer: Self-pay

## 2022-05-09 NOTE — Telephone Encounter (Signed)
Received call from Trayce reporting his phone was cut off and he had to get a new one and that the number is the same. He reports he is almost out of medications. I contacted [name] and they report they will have bubble packs delivered tomorrow. He was also reminded of upcoming appointment with HF clinic on Wednesday at 10:00. He agreed to be there and plans to bring meds. I will meet him there. He agreed. Call complete.   Maralyn Sago, EMT-Paramedic [phone] 05/09/2022

## 2022-05-10 NOTE — Progress Notes (Signed)
Advanced Heart Failure Clinic Note   PCP: [name], NP Primary Cardiologist: Dorris Carnes, MD  HF Cardiologist: Dr. Aundra Dubin   HPI: Benjamin [name] is a 56 y.o. with hx of diastolic CHF, HTN, DM, Atrial fib, PE in April 2020 (on Xarelto), OSA, GERD, chronic elevation of troponin.    Admitted 01/21/2019 with increased dyspnea and had PE. He was anticoagulated.    Patient re-admitted 5/20 for syncope/cough/dyspnea, COVID+, CT positive for bilateral GGOs, admitted to [facility] and discharged after 6 days, on O2.  Rec'd Cards f/u.   Patient re-admitted 02/20/19 for bright red blood per rectum, eval'ed by GI, had EGD/colon, dx'd with hemorrhoidal bleeding, discharged after 9 days on O2. He was again Covid + .   Readmitted 03/11/19 with increased shortness of breath. He did not have home oxygen. He was discharged to home the next day.    He presented to [facility] ED 04/11/19 with lower extremity edema. COVID negative. Diuresed with lasix drip and transitioned to torsemide 40 mg twice daily. Had [place] with mild volume overload and preserved cardiac output. Discharge weight was 407.9 pounds.    He was readmitted with symptomatic anemia 05/28/19 with hgb 7.1. Received 1UPRBCs. GI consulted. He started on anusol and continued on stool softner.    Readmitted 80/99/83 with a/c diastolic CHF and treated w/ IV Lasix. Had 2 day hospital stay and was discharged on 10/29. Echo showed normal LVEF 65-70%, RV interpreted as normal but suspect significant RV dysfunction.  After diuresis w/ IV Lasix, he was placed back on home diuretic regimen, torsemide 100 mg bid. Lisinopril 5 mg was also added to regimen. AHF team was not consulted that admit.   He was seen back in clinic 07/30/19. Felt poorly. SOB at rest and worse w/ exertion. Also w/ orthopnea/PND. Significant wt gain of 20 lb up to 428 lb (dry wt ~407 lb), w/ marked abdominal distention and LEE and poor response to IV diuretics.  Once diuresed transitioned to torsemide 100  mg twice a day. D/C 408 pounds.   Zio patch on 10/20/19-No arrhythmia.   Re-admitted 08/19/20-08/24/20 for  A fib RVR. A. fib RVR in the setting of noncompliance ethanol and cocaine. Cardizem started and labetalol continued.  12/3 TEE (EF 60-65%) with DCCV successfully converted to SR. Diuresed with IV lasix and had AKI in setting of over diuresis. SCr 1.59 on admit, bumped to 2.5. Diuretics held + IVF hydration.   Admitted 12/24/20 with hypertensive emergency and respiratory failure. .   Admitted 4/22 with A Fib RVR & AMS. Diltiazem increased to 360 mg daily. Chemically converted to NSR.  + Cocaine.   Seen in ED 11/22 for COPD exacerbation.  DC'd from paramedicine due to lack of communication.  Follow up 1/23 he had not been seen since 5/22. He appeared volume overloaded and torsemide increased.   Seen 11/02/21 and had been off all meds x 1 week, volume overloaded. Meds restarted and instructed to take metolazone 2.5 x 2 days w/ extra 40 kcl, however labs showed elevated BUN/SCr and left message to take metolazone x 1 day.  Re-enrolled in paramedicine.  Echo 5/23 EF 60-65%, moderate LVH, mid-cavity LV gradient to 29 mmHg, RV normal.   Today he returns for HF follow up. Overall feeling poorly. Having scrotal swelling making urination difficult. He says he has not been able to urinate in 3 days.  Out of meds x 2 days but restarted today. Wearing 3L oxygen now. Using Trelegy at night. Continues with  chronic leg pain and used cocaine a week ago. Denies palpitations, CP, dizziness, edema, or PND/Orthopnea. Appetite ok. No fever or chills. Weight at home 346-347 pounds.   ECG (personally reviewed): none ordered today.  Labs (11/20): BNP 32, K 3.6, creatinine 1.09  Labs (12/20): K 3.3, creatinine 1.28 Labs (1/21): K 3.9 Creatinine 1.4  Labs (11/05/19) : K 3.2 Creatinine 1.47  Labs (4/21) : K 3.5 Creatinine 1.14  Labs (6/21): K 3.8 Creatinine 1.09.  Labs (12/21): K 4.0, Creatinine 1.17, Magnesium  1.6 Labs (12/21): K 3.1, Creatinine 2.54 Labs (1/22): K 3.1, creatinine 1.56 Labs (5/22): K 3.6, creatinine 1.89 Labs (1/23): K 3.9, creatinine 1.34 Labs (3/23): K 4.0, creatinine 1.29 => 1.77 Labs (5/23); K 4.3, creatinine 1.47, LDL 65, HDL 44, normal LFTs  Review of Systems: All systems reviewed and negative except as per HPI.   PMH: 1. Atrial fibrillation: Paroxysmal.  - DCCV (08/21/20): converted to SR 2. Pulmonary embolus: 5/20.  3. OHS/OSA: He is on home oxygen during the day and uses CPAP at night.  4. Morbid obesity.  5. Chronic diastolic CHF:  - RHC (2/67): mean RA 12, PA 40/25 mean 31, mean PCWP 23, CI 2.47, PVR 1.1 WU.  - Echo (10/20): EF 65-70%, mild LVH, normal RV size and systolic function.  - TEE (12/21): EF 60-65% - Cardiolite in 12/21: fixed apical defect is likely artifact, no ischemia.  - Echo (5/23): EF 60-65%, moderate LVH, mid-cavity LV gradient to 29 mmHg, RV normal.  6. Type 2 diabetes 7. HTN 8. Rectal bleeding: ?Hemorrhoidal.  9. COVID-19 infection 7/20.  10. ZIo Patch - 10/20/19 no arrhythmias 11. CKD: Stage 3  Current Outpatient Medications  Medication Sig Dispense Refill   albuterol (PROVENTIL) (2.5 MG/3ML) 0.083% nebulizer solution INHALE 1 VIAL BY NEBULIZATION EVERY 6 (SIX) HOURS AS NEEDED FOR WHEEZING OR SHORTNESS OF BREATH. 150 mL 1   atorvastatin (LIPITOR) 40 MG tablet Take 1 tablet (40 mg total) by mouth at bedtime. 90 tablet 3   Blood Glucose Monitoring Suppl (TRUE METRIX METER) w/Device KIT Use as instructed. Check blood glucose level by fingerstick twice per day. 1 kit 0   Blood Pressure Monitor DEVI Please provide patient with insurance approved blood pressure device with L-XL cuff. BMI 55 1 each 0   carboxymethylcellulose (REFRESH PLUS) 0.5 % SOLN Place 1 drop into both eyes daily.     diltiazem (CARDIZEM CD) 360 MG 24 hr capsule Take 1 capsule (360 mg total) by mouth daily. 90 capsule 3   doxazosin (CARDURA) 2 MG tablet Take 1 tablet (2 mg  total) by mouth daily. 30 tablet 11   empagliflozin (JARDIANCE) 10 MG TABS tablet Take 1 tablet (10 mg total) by mouth daily before breakfast. 30 tablet 6   FEROSUL 325 (65 Fe) MG tablet TAKE 1 TABLET (325 MG TOTAL) BY MOUTH DAILY AT NOON. 90 tablet 0   glucose blood (TRUE METRIX BLOOD GLUCOSE TEST) test strip Use as instructed. Check blood glucose level by fingerstick twice per day. 100 each 12   hydrALAZINE (APRESOLINE) 50 MG tablet Take 1 tablet (50 mg total) by mouth 3 (three) times daily. 180 tablet 3   Ipratropium-Albuterol (COMBIVENT RESPIMAT) 20-100 MCG/ACT AERS respimat Inhale 1 puff into the lungs every 6 (six) hours. 4 g 5   losartan (COZAAR) 50 MG tablet Take 1 tablet (50 mg total) by mouth daily. 90 tablet 3   magnesium oxide (MAG-OX) 400 (240 Mg) MG tablet Take 1 tablet (400 mg total)  by mouth 2 (two) times daily. 90 tablet 3   metFORMIN (GLUCOPHAGE) 500 MG tablet Take 1 tablet (500 mg total) by mouth 2 (two) times daily with a meal. 180 tablet 0   metolazone (ZAROXOLYN) 2.5 MG tablet Take only as directed by CHF clinic. Take with 60meq of potassium. 10 tablet 0   omeprazole (PRILOSEC) 20 MG capsule TAKE 1 CAPSULE 2 (TWO) TIMES DAILY BEFORE A MEAL. TAKE FIRST CAPSULE 30 MIN PRIOR TO EATING OR TAKING OTHER MEDICATIONS. (AM+BEDTIME) 180 capsule 1   potassium chloride (KLOR-CON) 10 MEQ tablet Take 8 tablets (80 mEq total) by mouth daily. 240 tablet 11   rivaroxaban (XARELTO) 20 MG TABS tablet Take 1 tablet (20 mg total) by mouth daily with supper. 90 tablet 3   spironolactone (ALDACTONE) 50 MG tablet Take 1 tablet (50 mg total) by mouth daily. 30 tablet 11   STOOL SOFTENER 100 MG capsule TAKE 1 CAPSULE BY MOUTH 2 (TWO) TIMES DAILY (NOON+BEDTIME) 60 capsule 0   tamsulosin (FLOMAX) 0.4 MG CAPS capsule Take 0.4 mg by mouth daily after supper.     torsemide (DEMADEX) 20 MG tablet Take 2 tablets (40 mg total) by mouth 2 (two) times daily. 60 tablet 11   TRUEplus Lancets 28G MISC Use as  instructed. Check blood glucose level by fingerstick twice per day. 200 each 3   FUROSCIX 80 MG/10ML CTKT Inject into the skin. (Patient not taking: Reported on 05/11/2022)     [place] COVID-19 TEST KIT See admin instructions. (Patient not taking: Reported on 05/11/2022)     No current facility-administered medications for this encounter.   No Known Allergies  Social History   Socioeconomic History   Marital status: Single    Spouse name: Not on file   Number of children: Not on file   Years of education: Not on file   Highest education level: Not on file  Occupational History   Not on file  Tobacco Use   Smoking status: Former   Smokeless tobacco: Never   Tobacco comments:    smoked weed in the past  Vaping Use   Vaping Use: Never used  Substance and Sexual Activity   Alcohol use: Not Currently   Drug use: Yes    Types: Marijuana, Cocaine   Sexual activity: Not Currently  Other Topics Concern   Not on file  Social History Narrative   Not on file   Social Determinants of Health   Financial Resource Strain: High Risk (02/23/2022)   Overall Financial Resource Strain (CARDIA)    Difficulty of Paying Living Expenses: Hard  Food Insecurity: No Food Insecurity (08/03/2021)   Hunger Vital Sign    Worried About Running Out of Food in the Last Year: Never true    Ran Out of Food in the Last Year: Never true  Transportation Needs: Unmet Transportation Needs (05/11/2022)   PRAPARE - Hydrologist (Medical): Yes    Lack of Transportation (Non-Medical): No  Physical Activity: Not on file  Stress: Not on file  Social Connections: Not on file  Intimate Partner Violence: Not on file   Family History  Problem Relation Age of Onset   Hypertension Mother    Diabetes Mother    BP 120/78   Pulse 93   Wt (!) 156.5 kg (345 lb)   SpO2 96%   BMI 49.50 kg/m   Wt Readings from Last 3 Encounters:  05/11/22 (!) 156.5 kg (345 lb)  04/18/22 Marland Kitchen)  161 kg  (355 lb)  04/11/22 (!) 165.6 kg (365 lb)   PHYSICAL EXAM: General:  NAD. No resp difficulty, walked into clinic on oxygen HEENT: Normal Neck: Supple. Thick neck. Carotids 2+ bilat; no bruits. No lymphadenopathy or thryomegaly appreciated. Cor: PMI nondisplaced. Regular rate & rhythm. No rubs, gallops or murmurs. Lungs: Clear Abdomen: Obese, nontender, nondistended. No hepatosplenomegaly. No bruits or masses. Good bowel sounds. GU: external genitalia unremarkable, no scrotal swelling appreciated. Extremities: No cyanosis, clubbing, rash, edema; BLE varicosities. Neuro: Alert & oriented x 3, cranial nerves grossly intact. Moves all 4 extremities w/o difficulty. Affect pleasant.  ASSESSMENT & PLAN: 1. Chronic diastolic CHF: Echo 7/99 showed EF 60-65%, moderate LVH, mid-cavity LV gradient to 29 mmHg, RV normal.  Suspect LVH is due to long-standing HTN.  He is not volume overloaded on exam, weight down 13 lbs.  NYHA class II-III symptoms, limited by leg and scrotal pain.  - Continue torsemide 40 mg bid + 80 KCL daily. BMET/BNP today. - Continue Jardiance 10 mg daily. - Continue spironolactone 50 mg daily.  - Continue losartan 50 mg daily. - Continue hydralazine 50 mg tid - Given Rx for compression hose. - Cannot get Cardiomems as he has Medicaid.  2. Atrial fibrillation: Paroxysmal.  Regular on exam today.  - Continue current dose of diltiazem.  - Continue Xarelto. No bleeding issues. 3. H/o PE: 5/20, continue Xarelto. No  bleeding issues. 4. OHS/OSA: Uses O2 during the day and CPAP at night.  5. HTN: Controlled on current regimen.  6. DM: Per PCP. 7. Obesity: Body mass index is 49.5 kg/m.  - Continue weight loss efforts. 8. Substance abuse: Relapsed recently. Encouraged he get back to substance abuse meetings.   9. GU: Sounds like urinary hesitancy vs voiding dysfunction. No scrotal swelling on exam today. He does not know if he has BPH. He takes doxazosin. ? Switching to tamsulosin. -  He has Urology follow up today. - BMET today. 10. SDOH: Appreciate paramedicine assistance, I think this helps him tremendously. - He has a car and a place to live. - He has a scale and a PCP. - Meds thru [facility] in bubble packs.  Follow up in 3 months with Dr. Aundra Dubin.  Maricela Bo [facility] FNP-BC 05/11/2022

## 2022-05-11 ENCOUNTER — Encounter (HOSPITAL_COMMUNITY): Payer: Self-pay

## 2022-05-11 ENCOUNTER — Ambulatory Visit (HOSPITAL_COMMUNITY)
Admission: RE | Admit: 2022-05-11 | Discharge: 2022-05-11 | Disposition: A | Discharge status: Home / Self Care | Payer: Medicaid Other | Source: Ambulatory Visit | Attending: Family Medicine | Admitting: Family Medicine

## 2022-05-11 ENCOUNTER — Telehealth (HOSPITAL_COMMUNITY): Payer: Self-pay | Admitting: Licensed Clinical Social Worker

## 2022-05-11 VITALS — BP 120/78 | HR 93 | Wt 345.0 lb

## 2022-05-11 DIAGNOSIS — I5032 Chronic diastolic (congestive) heart failure: Secondary | ICD-10-CM | POA: Diagnosis not present

## 2022-05-11 DIAGNOSIS — Z7901 Long term (current) use of anticoagulants: Secondary | ICD-10-CM | POA: Diagnosis not present

## 2022-05-11 DIAGNOSIS — N5082 Scrotal pain: Secondary | ICD-10-CM | POA: Insufficient documentation

## 2022-05-11 DIAGNOSIS — E1122 Type 2 diabetes mellitus with diabetic chronic kidney disease: Secondary | ICD-10-CM | POA: Diagnosis not present

## 2022-05-11 DIAGNOSIS — Z86711 Personal history of pulmonary embolism: Secondary | ICD-10-CM | POA: Diagnosis not present

## 2022-05-11 DIAGNOSIS — Z7984 Long term (current) use of oral hypoglycemic drugs: Secondary | ICD-10-CM | POA: Insufficient documentation

## 2022-05-11 DIAGNOSIS — I2699 Other pulmonary embolism without acute cor pulmonale: Secondary | ICD-10-CM | POA: Diagnosis not present

## 2022-05-11 DIAGNOSIS — E118 Type 2 diabetes mellitus with unspecified complications: Secondary | ICD-10-CM

## 2022-05-11 DIAGNOSIS — G4733 Obstructive sleep apnea (adult) (pediatric): Secondary | ICD-10-CM | POA: Insufficient documentation

## 2022-05-11 DIAGNOSIS — Z6841 Body Mass Index (BMI) 40.0 and over, adult: Secondary | ICD-10-CM | POA: Diagnosis not present

## 2022-05-11 DIAGNOSIS — R39198 Other difficulties with micturition: Secondary | ICD-10-CM

## 2022-05-11 DIAGNOSIS — Z79899 Other long term (current) drug therapy: Secondary | ICD-10-CM | POA: Diagnosis not present

## 2022-05-11 DIAGNOSIS — I480 Paroxysmal atrial fibrillation: Secondary | ICD-10-CM | POA: Insufficient documentation

## 2022-05-11 DIAGNOSIS — I130 Hypertensive heart and chronic kidney disease with heart failure and stage 1 through stage 4 chronic kidney disease, or unspecified chronic kidney disease: Secondary | ICD-10-CM | POA: Diagnosis not present

## 2022-05-11 DIAGNOSIS — F1910 Other psychoactive substance abuse, uncomplicated: Secondary | ICD-10-CM

## 2022-05-11 DIAGNOSIS — I10 Essential (primary) hypertension: Secondary | ICD-10-CM

## 2022-05-11 DIAGNOSIS — Z9989 Dependence on other enabling machines and devices: Secondary | ICD-10-CM

## 2022-05-11 LAB — BASIC METABOLIC PANEL
Anion gap: 11 (ref 5–15)
BUN: 15 mg/dL (ref 6–20)
CO2: 28 mmol/L (ref 22–32)
Calcium: 9.5 mg/dL (ref 8.9–10.3)
Chloride: 97 mmol/L — ABNORMAL LOW (ref 98–111)
Creatinine, Ser: 1.48 mg/dL — ABNORMAL HIGH (ref 0.61–1.24)
GFR, Estimated: 55 mL/min — ABNORMAL LOW (ref >60)
Glucose, Bld: 94 mg/dL (ref 70–99)
Potassium: 4.4 mmol/L (ref 3.5–5.1)
Sodium: 136 mmol/L (ref 135–145)

## 2022-05-11 LAB — BRAIN NATRIURETIC PEPTIDE: B Natriuretic Peptide: 5.4 pg/mL (ref 0.0–100.0)

## 2022-05-11 NOTE — Patient Instructions (Signed)
It was great to see you today! ?No medication changes are needed at this time. ? ?Labs today ?We will only contact you if something comes back abnormal or we need to make some changes. ?Otherwise no news is good news! ? ?Your physician recommends that you schedule a follow-up appointment in: 3-4 months with Dr McLean ? ? ?Do the following things EVERYDAY: ?Weigh yourself in the morning before breakfast. Write it down and keep it in a log. ?Take your medicines as prescribed ?Eat low salt foods--Limit salt (sodium) to 2000 mg per day.  ?Stay as active as you can everyday ?Limit all fluids for the day to less than 2 liters ? ?At the [facility], you and your health needs are our priority. As part of our continuing mission to provide you with exceptional heart care, we have created designated Provider Care Teams. These Care Teams include your primary Cardiologist (physician) and Advanced Practice Providers (APPs- Physician Assistants and Nurse Practitioners) who all work together to provide you with the care you need, when you need it.  ? ?You may see any of the following providers on your designated Care Team at your next follow up: ?Dr Daniel Bensimhon ?Dr Dalton McLean ?Amy Clegg, NP ?Brittainy Simmons, PA ?Jessica Milford,NP ?Lindsay Finch, PA ?Lauren Kemp, PharmD ? ? ?Please be sure to bring in all your medications bottles to every appointment.  ? ?If you have any questions or concerns before your next appointment please send us a message through mychart or call our office at [phone].   ? ?TO LEAVE A MESSAGE FOR THE NURSE SELECT OPTION 2, PLEASE LEAVE A MESSAGE INCLUDING: ?YOUR NAME ?DATE OF BIRTH ?CALL BACK NUMBER ?REASON FOR CALL**this is important as we prioritize the call backs ? ?YOU WILL RECEIVE A CALL BACK THE SAME DAY AS LONG AS YOU CALL BEFORE 4:00 PM ? ? ?

## 2022-05-11 NOTE — Telephone Encounter (Signed)
H&V Care Navigation CSW Progress Note  Clinical Social Worker informed by [employer] that pt in need of transport to appt today as his car stopped working. Able to book uber ride through [place] to get pt to appt.  Patient is participating in a Managed Medicaid Plan:  Yes  SDOH Screenings   Alcohol Screen: Not on file  Depression (PHQ2-9): Medium Risk (03/30/2022)   Depression (PHQ2-9)    PHQ-2 Score: 8  Financial Resource Strain: High Risk (02/23/2022)   Overall Financial Resource Strain (CARDIA)    Difficulty of Paying Living Expenses: Hard  Food Insecurity: No Food Insecurity (08/03/2021)   Hunger Vital Sign    Worried About Running Out of Food in the Last Year: Never true    Ran Out of Food in the Last Year: Never true  Housing: High Risk (02/23/2022)   Housing    Last Housing Risk Score: 2  Physical Activity: Not on file  Social Connections: Not on file  Stress: Not on file  Tobacco Use: Medium Risk (03/30/2022)   Patient History    Smoking Tobacco Use: Former    Smokeless Tobacco Use: Never    Passive Exposure: Not on file  Transportation Needs: Unmet Transportation Needs (05/11/2022)   PRAPARE - Administrator, Civil Service (Medical): Yes    Lack of Transportation (Non-Medical): No     [name], LCSW Clinical Social Worker Advanced [facility] Failure Clinic Desk#: [phone] Cell#: [phone]

## 2022-05-12 ENCOUNTER — Other Ambulatory Visit: Payer: Self-pay | Admitting: Nurse Practitioner

## 2022-05-12 ENCOUNTER — Telehealth: Payer: Self-pay | Admitting: Nurse Practitioner

## 2022-05-12 DIAGNOSIS — F4311 Post-traumatic stress disorder, acute: Secondary | ICD-10-CM | POA: Diagnosis not present

## 2022-05-12 MED ORDER — METFORMIN HCL 500 MG PO TABS: 500.0000 mg | ORAL_TABLET | Freq: Two times a day (BID) | ORAL | 1 refills | Status: DC

## 2022-05-12 NOTE — Telephone Encounter (Signed)
Requested Prescriptions  Pending Prescriptions Disp Refills  . metFORMIN (GLUCOPHAGE) 500 MG tablet 180 tablet 0    Sig: Take 1 tablet (500 mg total) by mouth 2 (two) times daily with a meal.     Endocrinology:  Diabetes - Biguanides Failed - 05/12/2022  1:55 PM      Failed - Cr in normal range and within 360 days    Creatinine, Ser  Date Value Ref Range Status  05/11/2022 1.48 (H) 0.61 - 1.24 mg/dL Final   Creatinine, Urine  Date Value Ref Range Status  02/08/2019 226.67 mg/dL Final    Comment:    Performed at [facility], [place] [address], [place], [place] [address]         Failed - eGFR in normal range and within 360 days    GFR calc Af Amer  Date Value Ref Range Status  03/02/2020 >60 >60 mL/min Final   GFR, Estimated  Date Value Ref Range Status  05/11/2022 55 (L) >60 mL/min Final    Comment:    (NOTE) Calculated using the CKD-EPI Creatinine Equation (2021)    eGFR  Date Value Ref Range Status  03/30/2022 62 >59 mL/min/1.73 Final         Failed - B12 Level in normal range and within 720 days    No results found for: "VITAMINB12"       Passed - HBA1C is between 0 and 7.9 and within 180 days    Hgb A1c MFr Bld  Date Value Ref Range Status  02/16/2022 6.7 (H) 4.8 - 5.6 % Final    Comment:    (NOTE) Pre diabetes:          5.7%-6.4%  Diabetes:              >6.4%  Glycemic control for   <7.0% adults with diabetes          Passed - Valid encounter within last 6 months    Recent Outpatient Visits          1 month ago Essential hypertension   [place], Vernia Buff, NP   1 year ago Essential hypertension   [place], Vernia Buff, NP   1 year ago Wound of abdomen   [place], Vernia Buff, NP   1 year ago Hospital discharge follow-up   [place], [name] W, NP   2 years ago Essential hypertension    [place], Vernia Buff, NP      Future Appointments            In 4 days Charlott Rakes, MD [place]   In 1 month [place], [place], NP [place] - CBC within normal limits and completed in the last 12 months    WBC  Date Value Ref Range Status  02/16/2022 7.4 4.0 - 10.5 K/uL Final   RBC  Date Value Ref Range Status  02/16/2022 5.56 4.22 - 5.81 MIL/uL Final   Hemoglobin  Date Value Ref Range Status  02/16/2022 13.7 13.0 - 17.0 g/dL Final  12/21/2020 12.8 (L) 13.0 - 17.7 g/dL Final   Total hemoglobin  Date Value Ref Range Status  08/02/2019 10.1 (L) 12.0 - 16.0 g/dL Final   HCT  Date  Value Ref Range Status  02/16/2022 45.3 39.0 - 52.0 % Final   Hematocrit  Date Value Ref Range Status  12/21/2020 39.9 37.5 - 51.0 % Final   MCHC  Date Value Ref Range Status  02/16/2022 30.2 30.0 - 36.0 g/dL Final   [facility]  Date Value Ref Range Status  02/16/2022 24.6 (L) 26.0 - 34.0 pg Final   MCV  Date Value Ref Range Status  02/16/2022 81.5 80.0 - 100.0 fL Final  12/21/2020 79 79 - 97 fL Final   No results found for: "PLTCOUNTKUC", "LABPLAT", "POCPLA" RDW  Date Value Ref Range Status  02/16/2022 15.8 (H) 11.5 - 15.5 % Final  12/21/2020 14.7 11.6 - 15.4 % Final

## 2022-05-12 NOTE — Telephone Encounter (Signed)
Alcario Drought with [name] states she was able to get the patient a 30 supply for now of his Metformin  He has bubble pack and Metofrmin only in there as of today to complete it. She wanted the office to know. This is a maintenance medication and they sent a refill request on the 21st and had yet to hear back. Please assist further if necessary. A new prescription for his metformin refill still needs to be sent in.  metFORMIN (GLUCOPHAGE) 500 MG tablet  [name] - [place], [place] - [address] Phone:  [phone]  Fax:  [phone]     Or [phone]  Patient also has an appointment on August 28th at 9:50 with the provider

## 2022-05-12 NOTE — Telephone Encounter (Signed)
Medication Refill - Medication: Metformin 500 mg  Has the patient contacted their pharmacy? Yes.   (Agent: If no, request that the patient contact the pharmacy for the refill. If patient does not wish to contact the pharmacy document the reason why and proceed with request.) (Agent: If yes, when and what did the pharmacy advise?)  Preferred Pharmacy (with phone number or street name): [name] Has the patient been seen for an appointment in the last year OR does the patient have an upcoming appointment? Yes.    Agent: Please be advised that RX refills may take up to 3 business days. We ask that you follow-up with your pharmacy.

## 2022-05-16 ENCOUNTER — Ambulatory Visit: Payer: Medicaid Other | Admitting: Family Medicine

## 2022-05-17 ENCOUNTER — Telehealth (HOSPITAL_COMMUNITY): Payer: Self-pay

## 2022-05-17 NOTE — Telephone Encounter (Signed)
Spoke with Benjamin [name] in attempt to make a home visit appointment and he reports this week will not work but says he is available next week. He advised he has all medication and is feeling good and denied shortness of breath, chest pain or swelling. I advised him to reach out to me if needed in the meantime. He agreed with plan and we planned a home visit for next week.   Call complete.   Maralyn Sago, EMT-Paramedic [phone] 05/17/2022

## 2022-05-24 ENCOUNTER — Other Ambulatory Visit (HOSPITAL_COMMUNITY): Payer: Self-pay

## 2022-05-24 NOTE — Progress Notes (Signed)
Paramedicine Encounter    Patient ID: Benjamin [name], male    DOB: 1966-09-01, 56 y.o.   MRN: [id number]    Arrived for home visit for Benjamin [name] who reports having lower leg pain, constipation for 1 week, decreased urine output, leg pain and rectal pain. He stated that he has not passed a full bowel movement in over 5 days despite enemas, stool softeners, miralax and drinking lots of water. He says his urine output is decreased and urine is dark. He reports compliance with his normal medications- bubble packs indicate same. He has no lower leg pitting edema today. Lungs clear. He does have some general swelling to the lower left leg but no pitting edema noted. He denies illicit drug use, but admitted to same two weeks ago in clinic.  I obtained vitals- he has not yet taken his morning meds today.  WT- 358lbs (up 13 lbs in two weeks) -he admits to eating a lot more in the last two weeks also.  BP- 150/62 HR- 85 O2- 94% RR- 18 CBG- 123   I reviewed at length with him about med compliance, appointment compliance and the need for further evaluation with GI and Urinary symptoms. He agreed and says he will go to ER if he can't pass a BM and pain increases. I called [facility] and set up appointment for him on Thursday at 0930. He plans to be there. I wrote down appointment reminders for him.   He says he thinks he needs an increased dose of Torsemide- he says he feels the 2m BID isn't enough for him. I advised that recent visit and labs check NP JAllena Katzfelt care plans were going well. I will follow up once he is seen by [facility] and re-visit plan. He agreed. Home visit complete. I will see him in one to two weeks. He knows to reach out in the meantime if needed.   HSalena Saner EAudubon9/01/2022    Patient Care Team: FGildardo Pounds NP as PCP - General (Nurse Practitioner) RFay Records MD as PCP - Cardiology (Cardiology) UJorge Ny LCSW as Social  Worker (Licensed Clinical Social Worker)  Patient Active Problem List   Diagnosis Date Noted   Atrial fibrillation with RVR ([place] 01/08/2021   Acute respiratory failure (HS[place] 12/24/2020   Obstructive sleep apnea treated with BiPAP 12/24/2020   Polysubstance abuse (HU[place] 08/20/2020   Alcohol abuse 08/20/2020   Atrial fibrillation with rapid ventricular response (HMarianna 08/19/2020   Obesity hypoventilation syndrome (H[place] 06/12/2020   Healthcare maintenance 06/12/2020   Acute on chronic diastolic heart failure (HDenn[place] 02/24/2020   Diabetic neuropathy (H[place] 11/20/2019   Coagulation disorder (H[place] 11/20/2019   Acute on chronic diastolic CHF (congestive heart failure), NYHA class 3 (HHigh [place] 07/30/2019   Diabetes mellitus without complication (HDarrouzett 116/09/930  Hypertensive emergency    BRBPR (bright red blood per rectum) 06/12/2019   Hypertension    CHF (congestive heart failure) (HHerrin    Sleep apnea    Chronic respiratory failure (HCon[place]    Hypokalemia 05/29/2019   GI bleed 05/29/2019   Anemia 05/28/2019   Iron deficiency anemia 03/11/2019   OSA on CPAP 03/11/2019   HLD (hyperlipidemia) 03/11/2019   Elevated troponin 03/11/2019   GERD (gastroesophageal reflux disease) 03/11/2019   Rectal bleeding 02/20/2019   Dyspnea 02/06/2019   COVID-19 virus infection 02/06/2019   Bilateral lower extremity edema    Morbid obesity with BMI of 50.0-59.9, adult (HCC)    PAF (  paroxysmal atrial fibrillation) (HCC)    PE (pulmonary thromboembolism) ([place]) 01/21/2019   Hypertensive urgency 01/21/2019   Diabetes mellitus type 2 in obese ([place]) 01/21/2019    Current Outpatient Medications:    albuterol (PROVENTIL) (2.5 MG/3ML) 0.083% nebulizer solution, INHALE 1 VIAL BY NEBULIZATION EVERY 6 (SIX) HOURS AS NEEDED FOR WHEEZING OR SHORTNESS OF BREATH., Disp: 150 mL, Rfl: 1   atorvastatin (LIPITOR) 40 MG tablet, Take 1 tablet (40 mg total) by mouth at bedtime., Disp: 90 tablet, Rfl: 3   Blood Glucose Monitoring  Suppl (TRUE METRIX METER) w/Device KIT, Use as instructed. Check blood glucose level by fingerstick twice per day., Disp: 1 kit, Rfl: 0   Blood Pressure Monitor DEVI, Please provide patient with insurance approved blood pressure device with L-XL cuff. BMI 55, Disp: 1 each, Rfl: 0   carboxymethylcellulose (REFRESH PLUS) 0.5 % SOLN, Place 1 drop into both eyes daily., Disp: , Rfl:    diltiazem (CARDIZEM CD) 360 MG 24 hr capsule, Take 1 capsule (360 mg total) by mouth daily., Disp: 90 capsule, Rfl: 3   doxazosin (CARDURA) 2 MG tablet, Take 1 tablet (2 mg total) by mouth daily., Disp: 30 tablet, Rfl: 11   empagliflozin (JARDIANCE) 10 MG TABS tablet, Take 1 tablet (10 mg total) by mouth daily before breakfast., Disp: 30 tablet, Rfl: 6   FEROSUL 325 (65 Fe) MG tablet, TAKE 1 TABLET (325 MG TOTAL) BY MOUTH DAILY AT NOON., Disp: 90 tablet, Rfl: 0   FUROSCIX 80 MG/10ML CTKT, Inject into the skin. (Patient not taking: Reported on 05/11/2022), Disp: , Rfl:    glucose blood (TRUE METRIX BLOOD GLUCOSE TEST) test strip, Use as instructed. Check blood glucose level by fingerstick twice per day., Disp: 100 each, Rfl: 12   hydrALAZINE (APRESOLINE) 50 MG tablet, Take 1 tablet (50 mg total) by mouth 3 (three) times daily., Disp: 180 tablet, Rfl: 3   Ipratropium-Albuterol (COMBIVENT RESPIMAT) 20-100 MCG/ACT AERS respimat, Inhale 1 puff into the lungs every 6 (six) hours., Disp: 4 g, Rfl: 5   losartan (COZAAR) 50 MG tablet, Take 1 tablet (50 mg total) by mouth daily., Disp: 90 tablet, Rfl: 3   LUCIRA CHECK IT COVID-19 TEST KIT, See admin instructions. (Patient not taking: Reported on 05/11/2022), Disp: , Rfl:    magnesium oxide (MAG-OX) 400 (240 Mg) MG tablet, Take 1 tablet (400 mg total) by mouth 2 (two) times daily., Disp: 90 tablet, Rfl: 3   metFORMIN (GLUCOPHAGE) 500 MG tablet, Take 1 tablet (500 mg total) by mouth 2 (two) times daily with a meal., Disp: 180 tablet, Rfl: 1   metolazone (ZAROXOLYN) 2.5 MG tablet, Take  only as directed by CHF clinic. Take with 66mq of potassium., Disp: 10 tablet, Rfl: 0   omeprazole (PRILOSEC) 20 MG capsule, TAKE 1 CAPSULE 2 (TWO) TIMES DAILY BEFORE A MEAL. TAKE FIRST CAPSULE 30 MIN PRIOR TO EATING OR TAKING OTHER MEDICATIONS. (AM+BEDTIME), Disp: 180 capsule, Rfl: 1   potassium chloride (KLOR-CON) 10 MEQ tablet, Take 8 tablets (80 mEq total) by mouth daily., Disp: 240 tablet, Rfl: 11   rivaroxaban (XARELTO) 20 MG TABS tablet, Take 1 tablet (20 mg total) by mouth daily with supper., Disp: 90 tablet, Rfl: 3   spironolactone (ALDACTONE) 50 MG tablet, Take 1 tablet (50 mg total) by mouth daily., Disp: 30 tablet, Rfl: 11   STOOL SOFTENER 100 MG capsule, TAKE 1 CAPSULE BY MOUTH 2 (TWO) TIMES DAILY (NOON+BEDTIME), Disp: 60 capsule, Rfl: 0   tamsulosin (FLOMAX) 0.4 MG  CAPS capsule, Take 0.4 mg by mouth daily after supper., Disp: , Rfl:    torsemide (DEMADEX) 20 MG tablet, Take 2 tablets (40 mg total) by mouth 2 (two) times daily., Disp: 60 tablet, Rfl: 11   TRUEplus Lancets 28G MISC, Use as instructed. Check blood glucose level by fingerstick twice per day., Disp: 200 each, Rfl: 3 No Known Allergies   Social History   Socioeconomic History   Marital status: Single    Spouse name: Not on file   Number of children: Not on file   Years of education: Not on file   Highest education level: Not on file  Occupational History   Not on file  Tobacco Use   Smoking status: Former   Smokeless tobacco: Never   Tobacco comments:    smoked weed in the past  Vaping Use   Vaping Use: Never used  Substance and Sexual Activity   Alcohol use: Not Currently   Drug use: Yes    Types: Marijuana, Cocaine   Sexual activity: Not Currently  Other Topics Concern   Not on file  Social History Narrative   Not on file   Social Determinants of Health   Financial Resource Strain: High Risk (02/23/2022)   Overall Financial Resource Strain (CARDIA)    Difficulty of Paying Living Expenses: Hard   Food Insecurity: No Food Insecurity (08/03/2021)   Hunger Vital Sign    Worried About Running Out of Food in the Last Year: Never true    Ran Out of Food in the Last Year: Never true  Transportation Needs: Unmet Transportation Needs (05/11/2022)   PRAPARE - Hydrologist (Medical): Yes    Lack of Transportation (Non-Medical): No  Physical Activity: Not on file  Stress: Not on file  Social Connections: Not on file  Intimate Partner Violence: Not on file    Physical Exam      Future Appointments  Date Time Provider [place]  07/04/2022  9:10 AM [name], NP [facility] None  08/08/2022 11:15 AM Trula Slade, DPM [facility]  08/16/2022 11:40 AM Larey Dresser, MD [facility] None     ACTION: Home visit completed

## 2022-05-26 DIAGNOSIS — R3912 Poor urinary stream: Secondary | ICD-10-CM | POA: Diagnosis not present

## 2022-05-26 DIAGNOSIS — N4341 Spermatocele of epididymis, single: Secondary | ICD-10-CM | POA: Diagnosis not present

## 2022-06-02 DIAGNOSIS — I5032 Chronic diastolic (congestive) heart failure: Secondary | ICD-10-CM | POA: Diagnosis not present

## 2022-06-02 DIAGNOSIS — F4311 Post-traumatic stress disorder, acute: Secondary | ICD-10-CM | POA: Diagnosis not present

## 2022-06-02 DIAGNOSIS — J9611 Chronic respiratory failure with hypoxia: Secondary | ICD-10-CM | POA: Diagnosis not present

## 2022-06-06 ENCOUNTER — Telehealth (HOSPITAL_COMMUNITY): Payer: Self-pay

## 2022-06-06 NOTE — Telephone Encounter (Signed)
Reached out to Benjamin [name] in reference to scheduling a home visit. Was informed by EMS that he called out this weekend for shortness of breath but refused transport. EMS reported that he had been off his oxygen however vitals were within normal range and lungs were clear. I will continue to attempt to follow up. Message left on voicemail at 10:40am. Call complete.   [name], [place] 06/06/2022

## 2022-06-09 ENCOUNTER — Telehealth (HOSPITAL_COMMUNITY): Payer: Self-pay

## 2022-06-09 ENCOUNTER — Other Ambulatory Visit (HOSPITAL_COMMUNITY): Payer: Self-pay | Admitting: Family Medicine

## 2022-06-09 ENCOUNTER — Other Ambulatory Visit: Payer: Self-pay | Admitting: Family Medicine

## 2022-06-09 DIAGNOSIS — F4311 Post-traumatic stress disorder, acute: Secondary | ICD-10-CM | POA: Diagnosis not present

## 2022-06-09 NOTE — Telephone Encounter (Signed)
Benjamin [name] contacted me asking me for refills on his bubble packs. He says he is almost done with week 4. I contacted [name] and they report they will deliver same tomorrow.   I asked Benjamin [name] how he was feeling and he stated "fine I have been staying at home doing nothing". He denied shortness of breath, chest pain or dizziness. He stated he isn't swelling or having trouble right now. He denied any needs. I will plan to follow up next week.   Benjamin [name], [place] 06/09/2022

## 2022-06-13 ENCOUNTER — Other Ambulatory Visit (HOSPITAL_COMMUNITY): Payer: Self-pay
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
## 2022-06-14 ENCOUNTER — Telehealth: Payer: Self-pay | Admitting: Licensed Clinical Social Worker
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
## 2022-06-16 ENCOUNTER — Telehealth: Payer: Self-pay | Admitting: Licensed Clinical Social Worker

## 2022-06-16 NOTE — Telephone Encounter (Signed)
H&V Care Navigation CSW Progress Note  Clinical Social Worker contacted patient by phone to f/u on requested paperwork for housing re-certification. Was able to reach him at [phone]. Pt shares he was told that all paperwork was received and he is good to recertify. No additional questions/concerns today, wants [name] to call him when she returns to office. LCSW called Almyra Free with [facility] to ensure this was correct. She confirms all good to proceed with pt re-certification. No additional needs from her end at this time.   Patient is participating in a Managed Medicaid Plan:  Yes- Healthy Blue Medicaid  [place]: No Food Insecurity (08/03/2021)  Housing: High Risk (02/23/2022)  Transportation Needs: Unmet Transportation Needs (05/11/2022)  Depression (PHQ2-9): Medium Risk (03/30/2022)  Financial Resource Strain: High Risk (02/23/2022)  Tobacco Use: Medium Risk (05/11/2022)   Westley Hummer, MSW, [place]  [phone]- work cell phone (preferred) [phone]- desk phone

## 2022-06-20 ENCOUNTER — Telehealth (HOSPITAL_COMMUNITY): Payer: Self-pay

## 2022-06-20 NOTE — Telephone Encounter (Signed)
Attempted to reach Benjamin [name] in attempt to set up a home visit with no answer. I will continue to reach out.   [name], [place] 06/20/2022

## 2022-06-29 ENCOUNTER — Telehealth (HOSPITAL_COMMUNITY): Payer: Self-pay

## 2022-06-29 NOTE — Telephone Encounter (Signed)
Attempted reach [place] to schedule home paramedicine visit but not able to reach him and unable to leave a message. I will try again next week to reach him.   [name], [place] 06/29/2022

## 2022-06-30 ENCOUNTER — Telehealth (HOSPITAL_COMMUNITY): Payer: Self-pay | Admitting: Licensed Clinical Social Worker
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
## 2022-07-01 ENCOUNTER — Other Ambulatory Visit (HOSPITAL_COMMUNITY): Payer: Self-pay

## 2022-07-01 NOTE — Progress Notes (Signed)
Paramedicine Encounter    Patient ID: Benjamin [name], male    DOB: 04-09-1966, 56 y.o.   MRN: [id number]   Arrived for home visit for Benjamin [name] who reports to be feeling okay today. He denied shortness of breath, dizziness, chest pain or swelling. He reports his weight is 353lbs which is down from our last visit. No lower leg swelling or abdominal distention more than normal. He reports he has been taking his medications. He has two weeks of bubble packs left. He reports his urine output has been good and he has had no reoccurring issues related to same.   He reports he is using his BIPAP nightly and inhalers and nebs as needed.  Lungs clear on assessment.   Vitals as noted: BP- 122/68 HR- 110 RR- 16 O2- 97% WT- 353lbs   He reports he has not been using cocaine and has provided his parole officer with clean urine drug tests the last few times. He does report some depression and anxiety with his approaching court date in November but states that he hopes it has a good turn out.   He did get his car back but it's not currently running, [name] LCSW was able to provide him with some bus passes that I gave to him today. He does have a male older friend Fritz Pickerel) who is taking him to [place] today to see his lawyer.   He reports he got his rent paid and is up to date but he is worried about affording his [place] which is $220 and is due on 10/26.   He does have a cell phone- he reports he is using the current number [phone]   He reports he is staying away from drugs and people who might contribute to his negative decision making. I encouraged him and we talked for a while about staying clean and trying to make good choices. He appreciated the talk and we planned a visit in one month unless he needs me sooner. Home visit complete.   [name], [place] 07/01/2022   Patient Care Team: [name], NP as PCP - General (Nurse Practitioner) [name], MD as PCP - Cardiology (Cardiology) [name], LCSW as Social Worker (Licensed Clinical Social Worker)  Patient Active Problem List   Diagnosis Date Noted   Atrial fibrillation with RVR ([place]) 01/08/2021   Acute respiratory failure ([place]) 12/24/2020   Obstructive sleep apnea treated with BiPAP 12/24/2020   Polysubstance abuse ([place]) 08/20/2020   Alcohol abuse 08/20/2020   Atrial fibrillation with rapid ventricular response ([place]) 08/19/2020   Obesity hypoventilation syndrome ([place]) 06/12/2020   Healthcare maintenance 06/12/2020   Acute on chronic diastolic heart failure ([place]) 02/24/2020   Diabetic neuropathy ([place]) 11/20/2019   Coagulation disorder ([place]) 11/20/2019   Acute on chronic diastolic CHF (congestive heart failure), NYHA class 3 ([place]) 07/30/2019   Diabetes mellitus without complication ([place]) 93/90/3009   Hypertensive emergency    BRBPR (bright red blood per rectum) 06/12/2019   Hypertension    CHF (congestive heart failure) ([place])    Sleep apnea    Chronic respiratory failure ([place])    Hypokalemia 05/29/2019   GI bleed 05/29/2019   Anemia 05/28/2019   Iron deficiency anemia 03/11/2019   OSA on CPAP 03/11/2019   HLD (hyperlipidemia) 03/11/2019   Elevated troponin 03/11/2019   GERD (gastroesophageal reflux disease) 03/11/2019   Rectal bleeding 02/20/2019   Dyspnea 02/06/2019   COVID-19 virus infection 02/06/2019   Bilateral  lower extremity edema    Morbid obesity with BMI of 50.0-59.9, adult (HCC)    PAF (paroxysmal atrial fibrillation) (HCC)    PE (pulmonary thromboembolism) ([place]) 01/21/2019   Hypertensive urgency 01/21/2019   Diabetes mellitus type 2 in obese ([place]) 01/21/2019    Current Outpatient Medications:    albuterol (PROVENTIL) (2.5 MG/3ML) 0.083% nebulizer solution, INHALE 1 VIAL BY NEBULIZATION EVERY 6 (SIX) HOURS AS NEEDED FOR WHEEZING OR SHORTNESS OF BREATH., Disp: 150 mL, Rfl: 1   atorvastatin (LIPITOR) 40 MG tablet, Take 1 tablet (40 mg total)  by mouth at bedtime., Disp: 90 tablet, Rfl: 3   Blood Glucose Monitoring Suppl (TRUE METRIX METER) w/Device KIT, Use as instructed. Check blood glucose level by fingerstick twice per day., Disp: 1 kit, Rfl: 0   Blood Pressure Monitor DEVI, Please provide patient with insurance approved blood pressure device with L-XL cuff. BMI 55, Disp: 1 each, Rfl: 0   carboxymethylcellulose (REFRESH PLUS) 0.5 % SOLN, Place 1 drop into both eyes daily., Disp: , Rfl:    diltiazem (CARDIZEM CD) 360 MG 24 hr capsule, Take 1 capsule (360 mg total) by mouth daily., Disp: 90 capsule, Rfl: 3   doxazosin (CARDURA) 2 MG tablet, Take 1 tablet (2 mg total) by mouth daily. (Patient not taking: Reported on 05/24/2022), Disp: 30 tablet, Rfl: 11   empagliflozin (JARDIANCE) 10 MG TABS tablet, Take 1 tablet (10 mg total) by mouth daily before breakfast., Disp: 30 tablet, Rfl: 6   FEROSUL 325 (65 Fe) MG tablet, TAKE 1 TABLET (325 MG TOTAL) BY MOUTH DAILY AT NOON., Disp: 90 tablet, Rfl: 0   FUROSCIX 80 MG/10ML CTKT, Inject into the skin. (Patient not taking: Reported on 05/11/2022), Disp: , Rfl:    glucose blood (TRUE METRIX BLOOD GLUCOSE TEST) test strip, Use as instructed. Check blood glucose level by fingerstick twice per day., Disp: 100 each, Rfl: 12   hydrALAZINE (APRESOLINE) 50 MG tablet, Take 1 tablet (50 mg total) by mouth 3 (three) times daily., Disp: 180 tablet, Rfl: 3   Ipratropium-Albuterol (COMBIVENT RESPIMAT) 20-100 MCG/ACT AERS respimat, Inhale 1 puff into the lungs every 6 (six) hours., Disp: 4 g, Rfl: 5   losartan (COZAAR) 50 MG tablet, Take 1 tablet (50 mg total) by mouth daily., Disp: 90 tablet, Rfl: 3   LUCIRA CHECK IT COVID-19 TEST KIT, See admin instructions. (Patient not taking: Reported on 05/11/2022), Disp: , Rfl:    magnesium oxide (MAG-OX) 400 (240 Mg) MG tablet, TAKE 1 TABLET (400 MG TOTAL) BY MOUTH 2 (TWO) TIMES DAILY (NOON+BEDTIME), Disp: 90 tablet, Rfl: 3   metFORMIN (GLUCOPHAGE) 500 MG tablet, Take 1 tablet  (500 mg total) by mouth 2 (two) times daily with a meal., Disp: 180 tablet, Rfl: 1   metolazone (ZAROXOLYN) 2.5 MG tablet, Take only as directed by CHF clinic. Take with 37meq of potassium. (Patient not taking: Reported on 05/24/2022), Disp: 10 tablet, Rfl: 0   omeprazole (PRILOSEC) 20 MG capsule, TAKE 1 CAPSULE 2 (TWO) TIMES DAILY BEFORE A MEAL. TAKE FIRST CAPSULE 30 MIN PRIOR TO EATING OR TAKING OTHER MEDICATIONS. (AM+BEDTIME), Disp: 180 capsule, Rfl: 1   potassium chloride (KLOR-CON) 10 MEQ tablet, Take 8 tablets (80 mEq total) by mouth daily., Disp: 240 tablet, Rfl: 11   rivaroxaban (XARELTO) 20 MG TABS tablet, Take 1 tablet (20 mg total) by mouth daily with supper., Disp: 90 tablet, Rfl: 3   spironolactone (ALDACTONE) 50 MG tablet, Take 1 tablet (50 mg total) by mouth daily., Disp: 30  tablet, Rfl: 11   STOOL SOFTENER 100 MG capsule, TAKE 1 CAPSULE BY MOUTH 2 (TWO) TIMES DAILY (NOON+BEDTIME), Disp: 60 capsule, Rfl: 0   tamsulosin (FLOMAX) 0.4 MG CAPS capsule, Take 0.4 mg by mouth daily after supper., Disp: , Rfl:    torsemide (DEMADEX) 20 MG tablet, Take 2 tablets (40 mg total) by mouth 2 (two) times daily., Disp: 60 tablet, Rfl: 11   TRUEplus Lancets 28G MISC, Use as instructed. Check blood glucose level by fingerstick twice per day., Disp: 200 each, Rfl: 3 No Known Allergies   Social History   Socioeconomic History   Marital status: Single    Spouse name: Not on file   Number of children: Not on file   Years of education: Not on file   Highest education level: Not on file  Occupational History   Not on file  Tobacco Use   Smoking status: Former   Smokeless tobacco: Never   Tobacco comments:    smoked weed in the past  Vaping Use   Vaping Use: Never used  Substance and Sexual Activity   Alcohol use: Not Currently   Drug use: Yes    Types: Marijuana, Cocaine   Sexual activity: Not Currently  Other Topics Concern   Not on file  Social History Narrative   Not on file   Social  Determinants of Health   Financial Resource Strain: High Risk (02/23/2022)   Overall Financial Resource Strain (CARDIA)    Difficulty of Paying Living Expenses: Hard  Food Insecurity: No Food Insecurity (08/03/2021)   Hunger Vital Sign    Worried About Running Out of Food in the Last Year: Never true    Ran Out of Food in the Last Year: Never true  Transportation Needs: Unmet Transportation Needs (06/30/2022)   PRAPARE - Hydrologist (Medical): Yes    Lack of Transportation (Non-Medical): Yes  Physical Activity: Not on file  Stress: Not on file  Social Connections: Not on file  Intimate Partner Violence: Not on file    Physical Exam      Future Appointments  Date Time Provider [place]  07/04/2022  9:10 AM [name], NP [facility] None  08/08/2022 11:15 AM Trula Slade, DPM [facility]  08/16/2022 11:40 AM Larey Dresser, MD [facility] None     ACTION: Home visit completed

## 2022-07-02 DIAGNOSIS — J9611 Chronic respiratory failure with hypoxia: Secondary | ICD-10-CM | POA: Diagnosis not present

## 2022-07-02 DIAGNOSIS — I5032 Chronic diastolic (congestive) heart failure: Secondary | ICD-10-CM | POA: Diagnosis not present

## 2022-07-04 ENCOUNTER — Ambulatory Visit: Payer: Medicaid Other | Admitting: Nurse Practitioner

## 2022-07-13 ENCOUNTER — Telehealth (HOSPITAL_COMMUNITY): Payer: Self-pay

## 2022-07-13 NOTE — Telephone Encounter (Signed)
Benjamin [name] contacted me requesting a refill on his bubble packs from [facility]. He says he has two days left after today of meds. I reached out to [employer] and requested same.   Tydus also requesting to speak to [employer]- I will forward to her for her to reach out to patient.   [name], [place] 07/13/2022

## 2022-07-14 ENCOUNTER — Telehealth (HOSPITAL_COMMUNITY): Payer: Self-pay | Admitting: Licensed Clinical Social Worker

## 2022-07-14 NOTE — Telephone Encounter (Signed)
HF Paramedicine Team Based Care Meeting  HF MD- NA  HF NP - [place] NP-C   [facility] admit within the last 30 days for heart failure? no  SDOH - pending court date for parole violations- potentially going back to prison in November.  Eligible for discharge? Sees MD on Nov 28th so will plan to follow until then to check on progress and consider for DC.  [name], [place] Worker [facility] Desk#: [phone] Cell#: [phone]

## 2022-07-14 NOTE — Telephone Encounter (Signed)
H&V Care Navigation CSW Progress Note  Clinical Social Worker spoke with pt regarding outstanding electric bill- pt will have electric turned off tomorrow if minimum payment is not met.  CSW able to help pay minimum payment through patient care fund- pt will plan to do payment plan to pay off the remaining balance.  Pt also mentioned continued mental health struggles hearing voices- reports he still sees mental health provider with [facility] but that they don't help or prescribe medications.  States when he was on meds through [facility] he felt much better.  CSW encouraged pt to go to [facility] during walk in hours to see a provider and discuss mediation management.  Patient is participating in a Managed Medicaid Plan:  Yes  [place]: No Food Insecurity (08/03/2021)  Housing: High Risk (02/23/2022)  Transportation Needs: Unmet Transportation Needs (06/30/2022)  Utilities: At Risk (07/14/2022)  Depression (PHQ2-9): Medium Risk (03/30/2022)  Financial Resource Strain: High Risk (02/23/2022)  Tobacco Use: Medium Risk (05/11/2022)    [name], LCSW Clinical Social Worker [facility] Desk#: [phone] Cell#: [phone]

## 2022-07-15 ENCOUNTER — Telehealth (HOSPITAL_COMMUNITY): Payer: Self-pay | Admitting: Licensed Clinical Social Worker

## 2022-07-15 NOTE — Telephone Encounter (Signed)
H&V Care Navigation CSW Progress Note  Clinical Social Worker spoke with Benjamin [name] lawyer, Benjamin [name] [phone], who request help making a care plan for Benjamin [name] detailing how he will remain on track to help with his substance use.  CSW spoke with Benjamin [name] who will plan to meet with CSW Monday after he goes to walk in hours at [facility] [name], Benjamin [name] where is going to get set up with medication management.  At that time we will plan to call Benjamin [name] to discuss getting involved in more intensive program as currently only seeing counselor 1x/week.  Patient is participating in a Managed Medicaid Plan:  Yes  [place]: No Food Insecurity (08/03/2021)  Housing: High Risk (02/23/2022)  Transportation Needs: Unmet Transportation Needs (06/30/2022)  Utilities: At Risk (07/14/2022)  Depression (PHQ2-9): Medium Risk (03/30/2022)  Financial Resource Strain: High Risk (02/23/2022)  Tobacco Use: Medium Risk (05/11/2022)    Benjamin Ny, LCSW Clinical Social Worker [facility] Desk#: [phone] Cell#: [phone]

## 2022-07-18 ENCOUNTER — Other Ambulatory Visit (HOSPITAL_COMMUNITY)
Admission: EM | Admit: 2022-07-18 | Discharge: 2022-07-19 | Disposition: A | Discharge status: Home / Self Care | Payer: Medicaid Other | Attending: Psychiatry | Admitting: Psychiatry

## 2022-07-18 ENCOUNTER — Encounter (HOSPITAL_COMMUNITY): Payer: Self-pay | Admitting: Registered Nurse

## 2022-07-18 DIAGNOSIS — F1994 Other psychoactive substance use, unspecified with psychoactive substance-induced mood disorder: Secondary | ICD-10-CM | POA: Insufficient documentation

## 2022-07-18 DIAGNOSIS — I5032 Chronic diastolic (congestive) heart failure: Secondary | ICD-10-CM | POA: Insufficient documentation

## 2022-07-18 DIAGNOSIS — Z1152 Encounter for screening for COVID-19: Secondary | ICD-10-CM | POA: Insufficient documentation

## 2022-07-18 DIAGNOSIS — F1510 Other stimulant abuse, uncomplicated: Secondary | ICD-10-CM | POA: Diagnosis not present

## 2022-07-18 DIAGNOSIS — I110 Hypertensive heart disease with heart failure: Secondary | ICD-10-CM | POA: Insufficient documentation

## 2022-07-18 DIAGNOSIS — J449 Chronic obstructive pulmonary disease, unspecified: Secondary | ICD-10-CM | POA: Insufficient documentation

## 2022-07-18 DIAGNOSIS — Z9981 Dependence on supplemental oxygen: Secondary | ICD-10-CM | POA: Insufficient documentation

## 2022-07-18 DIAGNOSIS — F1590 Other stimulant use, unspecified, uncomplicated: Secondary | ICD-10-CM

## 2022-07-18 DIAGNOSIS — F1410 Cocaine abuse, uncomplicated: Secondary | ICD-10-CM | POA: Diagnosis not present

## 2022-07-18 DIAGNOSIS — F1010 Alcohol abuse, uncomplicated: Secondary | ICD-10-CM | POA: Diagnosis not present

## 2022-07-18 DIAGNOSIS — R440 Auditory hallucinations: Secondary | ICD-10-CM | POA: Insufficient documentation

## 2022-07-18 DIAGNOSIS — I480 Paroxysmal atrial fibrillation: Secondary | ICD-10-CM | POA: Insufficient documentation

## 2022-07-18 DIAGNOSIS — G4733 Obstructive sleep apnea (adult) (pediatric): Secondary | ICD-10-CM | POA: Insufficient documentation

## 2022-07-18 DIAGNOSIS — F1990 Other psychoactive substance use, unspecified, uncomplicated: Secondary | ICD-10-CM | POA: Diagnosis present

## 2022-07-18 LAB — COMPREHENSIVE METABOLIC PANEL
ALT: 16 U/L (ref 0–44)
AST: 20 U/L (ref 15–41)
Albumin: 3.7 g/dL (ref 3.5–5.0)
Alkaline Phosphatase: 45 U/L (ref 38–126)
Anion gap: 12 (ref 5–15)
BUN: 17 mg/dL (ref 6–20)
CO2: 26 mmol/L (ref 22–32)
Calcium: 9.3 mg/dL (ref 8.9–10.3)
Chloride: 101 mmol/L (ref 98–111)
Creatinine, Ser: 1.5 mg/dL — ABNORMAL HIGH (ref 0.61–1.24)
GFR, Estimated: 54 mL/min — ABNORMAL LOW (ref >60)
Glucose, Bld: 111 mg/dL — ABNORMAL HIGH (ref 70–99)
Potassium: 3.6 mmol/L (ref 3.5–5.1)
Sodium: 139 mmol/L (ref 135–145)
Total Bilirubin: 0.4 mg/dL (ref 0.3–1.2)
Total Protein: 7.3 g/dL (ref 6.5–8.1)

## 2022-07-18 LAB — CBC WITH DIFFERENTIAL/PLATELET
Abs Immature Granulocytes: 0.02 10*3/uL (ref 0.00–0.07)
Basophils Absolute: 0 10*3/uL (ref 0.0–0.1)
Basophils Relative: 1 %
Eosinophils Absolute: 0.3 10*3/uL (ref 0.0–0.5)
Eosinophils Relative: 5 %
HCT: 38.6 % — ABNORMAL LOW (ref 39.0–52.0)
Hemoglobin: 12.2 g/dL — ABNORMAL LOW (ref 13.0–17.0)
Immature Granulocytes: 0 %
Lymphocytes Relative: 26 %
Lymphs Abs: 1.6 10*3/uL (ref 0.7–4.0)
MCH: 25.9 pg — ABNORMAL LOW (ref 26.0–34.0)
MCHC: 31.6 g/dL (ref 30.0–36.0)
MCV: 82 fL (ref 80.0–100.0)
Monocytes Absolute: 0.5 10*3/uL (ref 0.1–1.0)
Monocytes Relative: 8 %
Neutro Abs: 3.7 10*3/uL (ref 1.7–7.7)
Neutrophils Relative %: 60 %
Platelets: 473 10*3/uL — ABNORMAL HIGH (ref 150–400)
RBC: 4.71 MIL/uL (ref 4.22–5.81)
RDW: 14 % (ref 11.5–15.5)
WBC: 6.1 10*3/uL (ref 4.0–10.5)
nRBC: 0 % (ref 0.0–0.2)

## 2022-07-18 LAB — POCT URINE DRUG SCREEN - MANUAL ENTRY (I-SCREEN)
POC Amphetamine UR: NOT DETECTED
POC Buprenorphine (BUP): NOT DETECTED
POC Cocaine UR: POSITIVE — AB
POC Marijuana UR: NOT DETECTED
POC Methadone UR: NOT DETECTED
POC Methamphetamine UR: NOT DETECTED
POC Morphine: NOT DETECTED
POC Oxazepam (BZO): NOT DETECTED
POC Oxycodone UR: NOT DETECTED
POC Secobarbital (BAR): NOT DETECTED

## 2022-07-18 LAB — LIPID PANEL
Cholesterol: 101 mg/dL (ref 0–200)
HDL: 45 mg/dL (ref >40)
LDL Cholesterol: 49 mg/dL (ref 0–99)
Total CHOL/HDL Ratio: 2.2 RATIO
Triglycerides: 36 mg/dL (ref <150)
VLDL: 7 mg/dL (ref 0–40)

## 2022-07-18 LAB — URINALYSIS, ROUTINE W REFLEX MICROSCOPIC
Bacteria, UA: NONE SEEN
Bilirubin Urine: NEGATIVE
Glucose, UA: NEGATIVE mg/dL
Hgb urine dipstick: NEGATIVE
Ketones, ur: NEGATIVE mg/dL
Nitrite: NEGATIVE
Protein, ur: NEGATIVE mg/dL
Specific Gravity, Urine: 1.015 (ref 1.005–1.030)
pH: 9 — ABNORMAL HIGH (ref 5.0–8.0)

## 2022-07-18 LAB — HEMOGLOBIN A1C
Hgb A1c MFr Bld: 6.3 % — ABNORMAL HIGH (ref 4.8–5.6)
Mean Plasma Glucose: 134.11 mg/dL

## 2022-07-18 LAB — GLUCOSE, CAPILLARY
Glucose-Capillary: 126 mg/dL — ABNORMAL HIGH (ref 70–99)
Glucose-Capillary: 101 mg/dL — ABNORMAL HIGH (ref 70–99)
Glucose-Capillary: 135 mg/dL — ABNORMAL HIGH (ref 70–99)

## 2022-07-18 LAB — ETHANOL: Alcohol, Ethyl (B): <10 mg/dL (ref <10)

## 2022-07-18 LAB — MAGNESIUM: Magnesium: 1.8 mg/dL (ref 1.7–2.4)

## 2022-07-18 LAB — POC SARS CORONAVIRUS 2 AG: SARSCOV2ONAVIRUS 2 AG: NEGATIVE

## 2022-07-18 LAB — TSH: TSH: 1.648 u[IU]/mL (ref 0.350–4.500)

## 2022-07-18 LAB — RESP PANEL BY RT-PCR (FLU A&B, COVID) ARPGX2
Influenza A by PCR: NEGATIVE
Influenza B by PCR: NEGATIVE
SARS Coronavirus 2 by RT PCR: NEGATIVE

## 2022-07-18 MED ORDER — ALBUTEROL SULFATE (2.5 MG/3ML) 0.083% IN NEBU: 2.5000 mg | INHALATION_SOLUTION | Freq: Four times a day (QID) | RESPIRATORY_TRACT | Status: DC | PRN

## 2022-07-18 MED ORDER — IPRATROPIUM-ALBUTEROL 20-100 MCG/ACT IN AERS
1.0000 | INHALATION_SPRAY | Freq: Four times a day (QID) | RESPIRATORY_TRACT | Status: DC
  Administered 2022-07-18 – 2022-07-19 (×5): 1 via RESPIRATORY_TRACT
  Filled 2022-07-18 (×2): qty 4

## 2022-07-18 MED ORDER — METFORMIN HCL 500 MG PO TABS
500.0000 mg | ORAL_TABLET | Freq: Two times a day (BID) | ORAL | Status: DC
  Administered 2022-07-18 – 2022-07-19 (×2): 500 mg via ORAL
  Filled 2022-07-18 (×2): qty 1

## 2022-07-18 MED ORDER — POTASSIUM CHLORIDE ER 10 MEQ PO TBCR
80.0000 meq | EXTENDED_RELEASE_TABLET | Freq: Every day | ORAL | Status: DC
  Filled 2022-07-18 (×2): qty 8

## 2022-07-18 MED ORDER — TAMSULOSIN HCL 0.4 MG PO CAPS
0.4000 mg | ORAL_CAPSULE | Freq: Every day | ORAL | Status: DC
  Administered 2022-07-18: 0.4 mg via ORAL
  Filled 2022-07-18: qty 1

## 2022-07-18 MED ORDER — HYDRALAZINE HCL 50 MG PO TABS
50.0000 mg | ORAL_TABLET | Freq: Three times a day (TID) | ORAL | Status: DC
  Administered 2022-07-18 – 2022-07-19 (×3): 50 mg via ORAL
  Filled 2022-07-18 (×3): qty 1

## 2022-07-18 MED ORDER — LOSARTAN POTASSIUM 50 MG PO TABS
50.0000 mg | ORAL_TABLET | Freq: Every day | ORAL | Status: DC
  Administered 2022-07-18 – 2022-07-19 (×2): 50 mg via ORAL
  Filled 2022-07-18 (×2): qty 1

## 2022-07-18 MED ORDER — POTASSIUM CHLORIDE ER 10 MEQ PO TBCR
40.0000 meq | EXTENDED_RELEASE_TABLET | Freq: Two times a day (BID) | ORAL | Status: DC
  Administered 2022-07-18 – 2022-07-19 (×2): 40 meq via ORAL
  Filled 2022-07-18 (×2): qty 4

## 2022-07-18 MED ORDER — RIVAROXABAN 20 MG PO TABS
20.0000 mg | ORAL_TABLET | Freq: Every day | ORAL | Status: DC
  Administered 2022-07-18: 20 mg via ORAL
  Filled 2022-07-18: qty 1

## 2022-07-18 MED ORDER — ATORVASTATIN CALCIUM 40 MG PO TABS
40.0000 mg | ORAL_TABLET | Freq: Every day | ORAL | Status: DC
  Administered 2022-07-18: 40 mg via ORAL
  Filled 2022-07-18: qty 1

## 2022-07-18 MED ORDER — DILTIAZEM HCL ER COATED BEADS 180 MG PO CP24
360.0000 mg | ORAL_CAPSULE | Freq: Every day | ORAL | Status: DC
  Administered 2022-07-18 – 2022-07-19 (×2): 360 mg via ORAL
  Filled 2022-07-18 (×2): qty 2

## 2022-07-18 MED ORDER — FERROUS SULFATE 325 (65 FE) MG PO TABS
325.0000 mg | ORAL_TABLET | Freq: Every day | ORAL | Status: DC
  Administered 2022-07-18 – 2022-07-19 (×2): 325 mg via ORAL
  Filled 2022-07-18 (×2): qty 1

## 2022-07-18 MED ORDER — ALUM & MAG HYDROXIDE-SIMETH 200-200-20 MG/5ML PO SUSP: 30.0000 mL | ORAL | Status: DC | PRN

## 2022-07-18 MED ORDER — TORSEMIDE 20 MG PO TABS
40.0000 mg | ORAL_TABLET | Freq: Two times a day (BID) | ORAL | Status: DC
  Administered 2022-07-18 – 2022-07-19 (×2): 40 mg via ORAL
  Filled 2022-07-18 (×2): qty 2

## 2022-07-18 MED ORDER — DOCUSATE SODIUM 100 MG PO CAPS
100.0000 mg | ORAL_CAPSULE | Freq: Two times a day (BID) | ORAL | Status: DC
  Administered 2022-07-18 – 2022-07-19 (×3): 100 mg via ORAL
  Filled 2022-07-18 (×3): qty 1

## 2022-07-18 MED ORDER — GABAPENTIN 300 MG PO CAPS
300.0000 mg | ORAL_CAPSULE | Freq: Three times a day (TID) | ORAL | Status: DC
  Administered 2022-07-18 – 2022-07-19 (×3): 300 mg via ORAL
  Filled 2022-07-18 (×3): qty 1

## 2022-07-18 MED ORDER — INSULIN ASPART 100 UNIT/ML IJ SOLN: 0.0000 [IU] | Freq: Three times a day (TID) | INTRAMUSCULAR | Status: DC

## 2022-07-18 MED ORDER — PANTOPRAZOLE SODIUM 40 MG PO TBEC
40.0000 mg | DELAYED_RELEASE_TABLET | Freq: Two times a day (BID) | ORAL | Status: DC
  Administered 2022-07-18 – 2022-07-19 (×3): 40 mg via ORAL
  Filled 2022-07-18 (×3): qty 1

## 2022-07-18 MED ORDER — MAGNESIUM HYDROXIDE 400 MG/5ML PO SUSP: 30.0000 mL | Freq: Every day | ORAL | Status: DC | PRN

## 2022-07-18 MED ORDER — SPIRONOLACTONE 25 MG PO TABS
50.0000 mg | ORAL_TABLET | Freq: Every day | ORAL | Status: DC
  Administered 2022-07-18 – 2022-07-19 (×2): 50 mg via ORAL
  Filled 2022-07-18 (×2): qty 2

## 2022-07-18 MED ORDER — ACETAMINOPHEN 325 MG PO TABS: 650.0000 mg | ORAL_TABLET | Freq: Four times a day (QID) | ORAL | Status: DC | PRN

## 2022-07-18 MED ORDER — EMPAGLIFLOZIN 10 MG PO TABS
10.0000 mg | ORAL_TABLET | Freq: Every day | ORAL | Status: DC
  Administered 2022-07-19: 10 mg via ORAL
  Filled 2022-07-18: qty 1

## 2022-07-18 MED ORDER — MAGNESIUM OXIDE -MG SUPPLEMENT 400 (240 MG) MG PO TABS
200.0000 mg | ORAL_TABLET | Freq: Two times a day (BID) | ORAL | Status: DC
  Administered 2022-07-18 – 2022-07-19 (×3): 200 mg via ORAL
  Filled 2022-07-18 (×3): qty 1

## 2022-07-18 NOTE — BH Assessment (Addendum)
Jene Every, Routine; 56 year old presents to [facility] voluntarily and unaccompanied with complaints of delusional.  Pt denies SI, HI or AVH.  Pt reports drinking alcohol, smoking both cocaine and marijuana on 07/17/22.  Pt reports his mother was standing behind the clinician this morning, "I want to go with my deceased mom".  Pt reports "my mom is telling me to come with her now".   Pt admits to prior MH diagnosis or prescribed medication for symptom management.  MSE signed by patient.

## 2022-07-18 NOTE — ED Notes (Signed)
Pt sleeping. Will continue to monitor for safety. No noted resp distress

## 2022-07-18 NOTE — ED Notes (Signed)
Pt is currently in group with SW.   

## 2022-07-18 NOTE — ED Notes (Signed)
STAT lab courier called to transport labs to [facility] lab.  Pt unable to provide urine sample at this time.  Will encourage fluids, pt has cup.

## 2022-07-18 NOTE — ED Notes (Signed)
Pt is alert and oriented.  He was escorted to [facility] by staff.  Gait even and steady  Pt searched with no contraband noted  He was given meal then shown to his room.  Pharm came and spoke with him. Marland Kitchen

## 2022-07-18 NOTE — ED Notes (Signed)
Patient arrived on unit, EKG And skin assessment, blood work completed. Denies current SI but endorses some passive SI- wants to be with his dead mother. Denies A/V hallucinations. Pt is safe on unit. Oriented patient to unit.

## 2022-07-18 NOTE — ED Provider Notes (Signed)
Facility Based Crisis Admission H&P  Date: 07/18/22 Patient Name: Benjamin [name] MRN: [id number] Chief Complaint:  Chief Complaint  Patient presents with   Delusional      Diagnoses:  Final diagnoses:  Substance induced mood disorder ([place])  Polysubstance use disorder    HPI: Benjamin [name], 56 y.o., male patient presents to [facility] as walk in with complaints of polysubstance abuse, worsening depression, and suicidal ideation.    Patient seen face to face by this provider, consulted with Dr. Hampton Abbot; and chart reviewed on 07/18/22.  On evaluation Benjamin [name] reports "I went up stairs (outpatient services) but they sent me down here (urgent care). I'm crazy as hell and I need some mental help.  I been drinking and drugging but I need to get back on my meds and get my life together."  Patient reports his main stressor is the death of his mother.  "She was my best friend and the only person that gave a damn about me.  I can hear her voice in my head telling me that I need to get my life together and to stop.  Some times I feel like I want to die but I can't do it myself and I guess the drugs and the alcohol is a slow death cause I can't kill myself."  Patient asked if he could actually hear his mothers voice or see his mother "No, it's just my thoughts, all in my head.  I miss her so much she is all I think about.  It's called grief."  Patient denies homicidal ideation, psychosis, and paranoia.  Reporting passive suicidal thoughts but no intent or plan.  States he want to get set up with outpatient psychiatric services and intensive outpatient drug rehab program.  "I got to get my life together before I really go crazy and got to get my temper under control. The other day I was in line and this white man behind me caught and I said to myself this nasty motherfucker coughed on me and I wanted to turn around and beat his ass but I got it under control and just walked out of the  store."  Patient denies a history of violence "but I feel like I can get to that point if I don't get my self together." Patient states he has been working with [name] (Education officer, museum) "she knows everything.  She is the only person in my corner.  Y'all can talk to her she knows what's going on and to keep her updated.   During evaluation Benjamin [name] is alert/oriented x 4; calm/cooperative; and mood is congruent with affect.  He does not appear to be responding to internal/external stimuli or delusional thought content.  denies  active suicidal ideation, he also denies self-harm/homicidal ideation, psychosis, and paranoia.  Patient answered question appropriately.     PHQ 2-9:  [employer] Visit from 03/30/2022 in [facility] Office Visit from 12/21/2020 in [facility] Office Visit from 04/30/2019 in [facility]  Thoughts that you would be better off dead, or of hurting yourself in some way Not at all Not at all Not at all  PHQ-9 Total Score 8 0 20       Flowsheet Row ED from 02/16/2022 in [facility] Most recent reading at 02/16/2022 10:17 AM ED from 07/20/2021 in [facility] [place] Most recent  reading at 07/20/2021  7:12 PM ED from 07/20/2021 in [facility] Most recent reading at 07/20/2021 11:47 AM  C-SSRS RISK CATEGORY No Risk No Risk No Risk        Total Time spent with patient: 45 minutes  Musculoskeletal  Strength & Muscle Tone: within normal limits Gait & Station: normal Patient leans: N/A  Psychiatric Specialty Exam  Presentation General Appearance:  Appropriate for Environment  Eye Contact: Good  Speech: Clear and Coherent; Normal Rate  Speech Volume: Normal  Handedness: Right   Mood and Affect  Mood: Anxious; Depressed  Affect: Congruent   Thought Process  Thought  Processes: Coherent; Goal Directed  Descriptions of Associations:Intact  Orientation:Full (Time, Place and Person)  Thought Content:Logical    Hallucinations:Hallucinations: None  Ideas of Reference:None  Suicidal Thoughts:Suicidal Thoughts: Yes, Passive SI Passive Intent and/or Plan: Without Intent; Without Plan  Homicidal Thoughts:Homicidal Thoughts: No   Sensorium  Memory: Immediate Good; Recent Good; Remote Fair  Judgment: Intact  Insight: Fair; Present   Executive Functions  Concentration:No data recorded Attention Span: Good  Recall: Good  Fund of Knowledge: Good  Language: Good   Psychomotor Activity  Psychomotor Activity: Psychomotor Activity: Normal   Assets  Assets: Communication Skills; Desire for Improvement; Financial Resources/Insurance; Housing; Leisure Time; Resilience; Social Support; Transportation   Sleep  Sleep: Sleep: Fair   Nutritional Assessment (For OBS and FBC admissions only) Has the patient had a weight loss or gain of 10 pounds or more in the last 3 months?: No Has the patient had a decrease in food intake/or appetite?: No Does the patient have dental problems?: No Does the patient have eating habits or behaviors that may be indicators of an eating disorder including binging or inducing vomiting?: No Has the patient recently lost weight without trying?: 0 Has the patient been eating poorly because of a decreased appetite?: 0 Malnutrition Screening Tool Score: 0    Physical Exam Vitals and nursing note reviewed. Exam conducted with a chaperone present.  Constitutional:      General: He is not in acute distress.    Appearance: Normal appearance. He is obese. He is not ill-appearing.  Cardiovascular:     Rate and Rhythm: Normal rate.  Pulmonary:     Effort: Pulmonary effort is normal.  Musculoskeletal:        General: Normal range of motion.     Cervical back: Normal range of motion.  Skin:    General: Skin  is warm and dry.  Neurological:     Mental Status: He is alert and oriented to person, place, and time.  Psychiatric:        Attention and Perception: Attention and perception normal. He does not perceive auditory or visual hallucinations.        Mood and Affect: Mood is anxious and depressed.        Speech: Speech normal.        Behavior: Behavior normal. Behavior is cooperative.        Thought Content: Thought content normal. Suicidal: Passive with no intent or plan.        Cognition and Memory: Cognition normal.        Judgment: Judgment is impulsive.    Review of Systems  Constitutional:  Negative for chills, diaphoresis, fever and weight loss.  Eyes: Negative.   Respiratory:  Negative for cough, shortness of breath and wheezing.   Cardiovascular:  Negative for chest pain and palpitations.  Gastrointestinal: Negative.  Genitourinary: Negative.   Musculoskeletal:  Positive for back pain and joint pain.  Psychiatric/Behavioral:  Positive for depression, substance abuse and suicidal ideas. Negative for hallucinations. The patient is nervous/anxious.     Blood pressure (!) 154/98, pulse 85, temperature 98.1 F (36.7 C), temperature source Oral, resp. rate 18, SpO2 98 %. There is no height or weight on file to calculate BMI.  Past Psychiatric History: Polysubstance abuse, substance induced mood disorder, anxiety, depression   Is the patient at risk to self? No  Has the patient been a risk to self in the past 6 months? No .    Has the patient been a risk to self within the distant past? No   Is the patient a risk to others? No   Has the patient been a risk to others in the past 6 months? No   Has the patient been a risk to others within the distant past? No   Past Medical History:  Past Medical History:  Diagnosis Date   A-fib ([place])    Anemia 05/29/2019   CHF (congestive heart failure) (HCC)    Chronic respiratory failure (HCC)    Diabetes mellitus without complication (HCC)     Dyspnea    Elevated troponin 02/06/2019   Hypertension    Obesity    Pulmonary embolism ([place])    Rectal bleeding 05/29/2019   Sleep apnea     Past Surgical History:  Procedure Laterality Date   CARDIOVERSION N/A 08/21/2020   Procedure: CARDIOVERSION;  Surgeon: Larey Dresser, MD;  Location: [facility];  Service: Cardiovascular;  Laterality: N/A;   COLONOSCOPY WITH PROPOFOL Left 02/25/2019   Procedure: COLONOSCOPY WITH PROPOFOL;  Surgeon: Arta Silence, MD;  Location: [facility];  Service: Endoscopy;  Laterality: Left;   ESOPHAGOGASTRODUODENOSCOPY (EGD) WITH PROPOFOL Left 02/25/2019   Procedure: ESOPHAGOGASTRODUODENOSCOPY (EGD) WITH PROPOFOL;  Surgeon: Arta Silence, MD;  Location[facility] ENDOSCOPY;  Service: Endoscopy;  Laterality: Left;   ESOPHAGOGASTRODUODENOSCOPY (EGD) WITH PROPOFOL N/A 06/12/2019   Procedure: ESOPHAGOGASTRODUODENOSCOPY (EGD) WITH PROPOFOL;  Surgeon: Clarene Essex, MD;  Location: [facility];  Service: Gastroenterology;  Laterality: N/A;   FLEXIBLE SIGMOIDOSCOPY N/A 06/12/2019   Procedure: FLEXIBLE SIGMOIDOSCOPY;  Surgeon: Clarene Essex, MD;  Location: [facility];  Service: Gastroenterology;  Laterality: N/A;   HEMORRHOID BANDING  05/2019   NO PAST SURGERIES     RIGHT HEART CATH N/A 04/17/2019   Procedure: RIGHT HEART CATH;  Surgeon: Larey Dresser, MD;  Location: [facility];  Service: Cardiovascular;  Laterality: N/A;   TEE WITHOUT CARDIOVERSION N/A 08/21/2020   Procedure: TRANSESOPHAGEAL ECHOCARDIOGRAM (TEE);  Surgeon: Larey Dresser, MD;  Location: [facility] END[facility]COPY;  Service: Cardiovascular;  Laterality: N/A;    Family History:  Family History  Problem Relation Age of Onset   Hypertension Mother    Diabetes Mother     Social History:  Social History   Socioeconomic History   Marital status: Single    Spouse name: Not on file   Number of children: Not on file   Years of education: Not on file   Highest education level: Not on file  Occupational  History   Not on file  Tobacco Use   Smoking status: Former   Smokeless tobacco: Never   Tobacco comments:    smoked weed in the past  Vaping Use   Vaping Use: Never used  Substance and Sexual Activity   Alcohol use: Not Currently   Drug use: Yes    Types: Marijuana, Cocaine  Sexual activity: Not Currently  Other Topics Concern   Not on file  Social History Narrative   Not on file   Social Determinants of Health   Financial Resource Strain: High Risk (02/23/2022)   Overall Financial Resource Strain (CARDIA)    Difficulty of Paying Living Expenses: Hard  Food Insecurity: No Food Insecurity (08/03/2021)   Hunger Vital Sign    Worried About Running Out of Food in the Last Year: Never true    Ran Out of Food in the Last Year: Never true  Transportation Needs: Unmet Transportation Needs (06/30/2022)   PRAPARE - Hydrologist (Medical): Yes    Lack of Transportation (Non-Medical): Yes  Physical Activity: Not on file  Stress: Not on file  Social Connections: Not on file  Intimate Partner Violence: Not on file    SDOH:  [place]: No Food Insecurity (08/03/2021)  Housing: High Risk (02/23/2022)  Transportation Needs: Unmet Transportation Needs (06/30/2022)  Utilities: At Risk (07/14/2022)  Depression (PHQ2-9): Medium Risk (03/30/2022)  Financial Resource Strain: High Risk (02/23/2022)  Tobacco Use: Medium Risk (07/18/2022)    Last Labs:  Admission on 07/18/2022  Component Date Value Ref Range Status   SARSCOV2ONAVIRUS 2 AG 07/18/2022 NEGATIVE  NEGATIVE Final   Comment: (NOTE) SARS-CoV-2 antigen NOT DETECTED.   Negative results are presumptive.  Negative results do not preclude SARS-CoV-2 infection and should not be used as the sole basis for treatment or other patient management decisions, including infection  control decisions, particularly in the presence of clinical signs and  symptoms consistent with COVID-19, or in  those who have been in contact with the virus.  Negative results must be combined with clinical observations, patient history, and epidemiological information. The expected result is Negative.  Fact Sheet for Patients: HandmadeRecipes.com.cy  Fact Sheet for Healthcare Providers: FuneralLife.at  This test is not yet approved or cleared by the [place] FDA and  has been authorized for detection and/or diagnosis of SARS-CoV-2 by FDA under an Emergency Use Authorization (EUA).  This EUA will remain in effect (meaning this test can be used) for the duration of  the COV                          ID-19 declaration under Section 564(b)(1) of the Act, 21 U.S.C. section 360bbb-3(b)(1), unless the authorization is terminated or revoked sooner.    Hospital Outpatient Visit on 05/11/2022  Component Date Value Ref Range Status   Sodium 05/11/2022 136  135 - 145 mmol/L Final   Potassium 05/11/2022 4.4  3.5 - 5.1 mmol/L Final   Chloride 05/11/2022 97 (L)  98 - 111 mmol/L Final   CO2 05/11/2022 28  22 - 32 mmol/L Final   Glucose, Bld 05/11/2022 94  70 - 99 mg/dL Final   Glucose reference range applies only to samples taken after fasting for at least 8 hours.   BUN 05/11/2022 15  6 - 20 mg/dL Final   Creatinine, Ser 05/11/2022 1.48 (H)  0.61 - 1.24 mg/dL Final   Calcium 05/11/2022 9.5  8.9 - 10.3 mg/dL Final   GFR, Estimated 05/11/2022 55 (L)  >60 mL/min Final   Comment: (NOTE) Calculated using the CKD-EPI Creatinine Equation (2021)    Anion gap 05/11/2022 11  5 - 15 Final   Performed at [facility], [place] [address] [place], [place] [address]   B Natriuretic Peptide 05/11/2022 5.4  0.0 - 100.0 pg/mL Final   Performed at [facility], [place] [address] [place], [place] [address]  Office Visit on 03/30/2022  Component Date Value Ref Range Status   Glucose 03/30/2022 96  70 - 99 mg/dL Final   BUN 03/30/2022 14  6 - 24 mg/dL Final    Creatinine, Ser 03/30/2022 1.34 (H)  0.76 - 1.27 mg/dL Final   eGFR 03/30/2022 62  >59 mL/min/1.73 Final   BUN/Creatinine Ratio 03/30/2022 10  9 - 20 Final   Sodium 03/30/2022 141  134 - 144 mmol/L Final   Potassium 03/30/2022 5.0  3.5 - 5.2 mmol/L Final   Chloride 03/30/2022 99  96 - 106 mmol/L Final   CO2 03/30/2022 26  20 - 29 mmol/L Final   Calcium 03/30/2022 9.6  8.7 - 10.2 mg/dL Final   Total Protein 03/30/2022 7.2  6.0 - 8.5 g/dL Final   Albumin 03/30/2022 4.2  3.8 - 4.9 g/dL Final                 **Please note reference interval change**   Globulin, Total 03/30/2022 3.0  1.5 - 4.5 g/dL Final   Albumin/Globulin Ratio 03/30/2022 1.4  1.2 - 2.2 Final   Bilirubin Total 03/30/2022 <0.2  0.0 - 1.2 mg/dL Final   Alkaline Phosphatase 03/30/2022 61  44 - 121 IU/L Final   AST 03/30/2022 19  0 - 40 IU/L Final   ALT 03/30/2022 16  0 - 44 IU/L Final   HCV Ab 03/30/2022 Non Reactive  Non Reactive Final   HCV Interp 1: 03/30/2022 Comment   Final   Comment: Not infected with HCV unless early or acute infection is suspected (which may be delayed in an immunocompromised individual), or other evidence exists to indicate HCV infection.   Admission on 02/16/2022, Discharged on 02/16/2022  Component Date Value Ref Range Status   Hgb A1c MFr Bld 02/16/2022 6.7 (H)  4.8 - 5.6 % Final   Comment: (NOTE) Pre diabetes:          5.7%-6.4%  Diabetes:              >6.4%  Glycemic control for   <7.0% adults with diabetes    Mean Plasma Glucose 02/16/2022 145.59  mg/dL Final   Performed at [facility] [address] [place], [place] [address]   WBC 02/16/2022 7.4  4.0 - 10.5 K/uL Final   RBC 02/16/2022 5.56  4.22 - 5.81 MIL/uL Final   Hemoglobin 02/16/2022 13.7  13.0 - 17.0 g/dL Final   HCT 02/16/2022 45.3  39.0 - 52.0 % Final   MCV 02/16/2022 81.5  80.0 - 100.0 fL Final   MCH 02/16/2022 24.6 (L)  26.0 - 34.0 pg Final   MCHC 02/16/2022 30.2  30.0 - 36.0 g/dL Final   RDW 02/16/2022 15.8 (H)   11.5 - 15.5 % Final   Platelets 02/16/2022 394  150 - 400 K/uL Final   nRBC 02/16/2022 0.0  0.0 - 0.2 % Final   Neutrophils Relative % 02/16/2022 59  % Final   Neutro Abs 02/16/2022 4.4  1.7 - 7.7 K/uL Final   Lymphocytes Relative 02/16/2022 31  % Final   Lymphs Abs 02/16/2022 2.3  0.7 - 4.0 K/uL Final   Monocytes Relative 02/16/2022 8  % Final   Monocytes Absolute 02/16/2022 0.6  0.1 - 1.0 K/uL Final   Eosinophils Relative 02/16/2022 2  % Final   Eosinophils Absolute 02/16/2022 0.2  0.0 -  0.5 K/uL Final   Basophils Relative 02/16/2022 0  % Final   Basophils Absolute 02/16/2022 0.0  0.0 - 0.1 K/uL Final   Immature Granulocytes 02/16/2022 0  % Final   Abs Immature Granulocytes 02/16/2022 0.01  0.00 - 0.07 K/uL Final   Performed at [facility], [place] [address], [place], [place] [address]   Prothrombin Time 02/16/2022 12.9  11.4 - 15.2 seconds Final   INR 02/16/2022 1.0  0.8 - 1.2 Final   Comment: (NOTE) INR goal varies based on device and disease states. Performed at [facility], [place] [address] [place], [place] [address]    aPTT 02/16/2022 30  24 - 36 seconds Final   Performed at [facility], [place] [address], [place], [place] [address]   Sodium 02/16/2022 137  135 - 145 mmol/L Final   Potassium 02/16/2022 4.3  3.5 - 5.1 mmol/L Final   Chloride 02/16/2022 103  98 - 111 mmol/L Final   CO2 02/16/2022 28  22 - 32 mmol/L Final   Glucose, Bld 02/16/2022 97  70 - 99 mg/dL Final   Glucose reference range applies only to samples taken after fasting for at least 8 hours.   BUN 02/16/2022 14  6 - 20 mg/dL Final   Creatinine, Ser 02/16/2022 1.47 (H)  0.61 - 1.24 mg/dL Final   Calcium 02/16/2022 9.2  8.9 - 10.3 mg/dL Final   Total Protein 02/16/2022 7.5  6.5 - 8.1 g/dL Final   Albumin 02/16/2022 3.5  3.5 - 5.0 g/dL Final   AST 02/16/2022 17  15 - 41 U/L Final   ALT 02/16/2022 15  0 - 44 U/L Final   Alkaline Phosphatase 02/16/2022 46  38 - 126 U/L Final   Total Bilirubin  02/16/2022 0.5  0.3 - 1.2 mg/dL Final   GFR, Estimated 02/16/2022 56 (L)  >60 mL/min Final   Comment: (NOTE) Calculated using the CKD-EPI Creatinine Equation (2021)    Anion gap 02/16/2022 6  5 - 15 Final   Performed at [facility] [address] [place], [place] [address]   Troponin I (High Sensitivity) 02/16/2022 22 (H)  <18 ng/L Final   Comment: (NOTE) Elevated high sensitivity troponin I (hsTnI) values and significant  changes across serial measurements may suggest ACS but many other  chronic and acute conditions are known to elevate hsTnI results.  Refer to the "Links" section for chest pain algorithms and additional  guidance. Performed at [facility], [place] [address] [place], [place] [address]    Cholesterol 02/16/2022 117  0 - 200 mg/dL Final   Triglycerides 02/16/2022 40  <150 mg/dL Final   HDL 02/16/2022 44  >40 mg/dL Final   Total CHOL/HDL Ratio 02/16/2022 2.7  RATIO Final   VLDL 02/16/2022 8  0 - 40 mg/dL Final   LDL Cholesterol 02/16/2022 65  0 - 99 mg/dL Final   Comment:        Total Cholesterol/HDL:CHD Risk Coronary Heart Disease Risk Table                     Men   Women  1/2 Average Risk   3.4   3.3  Average Risk       5.0   4.4  2 X Average Risk   9.6   7.1  3 X Average Risk  23.4   11.0        Use the calculated Patient Ratio above  and the CHD Risk Table to determine the patient's CHD Risk.        ATP III CLASSIFICATION (LDL):  <100     mg/dL   Optimal  100-129  mg/dL   Near or Above                    Optimal  130-159  mg/dL   Borderline  160-189  mg/dL   High  >190     mg/dL   Very High Performed at [facility] [address], [place], [place] [address]    B Natriuretic Peptide 02/16/2022 17.4  0.0 - 100.0 pg/mL Final   Performed at [facility] [address] [place], [place] [address]   Troponin I (High Sensitivity) 02/16/2022 22 (H)  <18 ng/L Final   Comment: (NOTE) Elevated high sensitivity troponin I (hsTnI)  values and significant  changes across serial measurements may suggest ACS but many other  chronic and acute conditions are known to elevate hsTnI results.  Refer to the "Links" section for chest pain algorithms and additional  guidance. Performed at [facility], [place] [address], [place], [place] [address]   Hospital Outpatient Visit on 01/28/2022  Component Date Value Ref Range Status   Sodium 01/28/2022 140  135 - 145 mmol/L Final   Potassium 01/28/2022 4.2  3.5 - 5.1 mmol/L Final   Chloride 01/28/2022 102  98 - 111 mmol/L Final   CO2 01/28/2022 31  22 - 32 mmol/L Final   Glucose, Bld 01/28/2022 97  70 - 99 mg/dL Final   Glucose reference range applies only to samples taken after fasting for at least 8 hours.   BUN 01/28/2022 15  6 - 20 mg/dL Final   Creatinine, Ser 01/28/2022 1.30 (H)  0.61 - 1.24 mg/dL Final   Calcium 01/28/2022 9.5  8.9 - 10.3 mg/dL Final   GFR, Estimated 01/28/2022 >60  >60 mL/min Final   Comment: (NOTE) Calculated using the CKD-EPI Creatinine Equation (2021)    Anion gap 01/28/2022 7  5 - 15 Final   Performed at [facility], [place] [address] [name], [place] [address]   B Natriuretic Peptide 01/28/2022 10.1  0.0 - 100.0 pg/mL Final   Performed at [facility] [address], [place], [place] [address]   Cholesterol 01/28/2022 126  0 - 200 mg/dL Final   Triglycerides 01/28/2022 65  <150 mg/dL Final   HDL 01/28/2022 46  >40 mg/dL Final   Total CHOL/HDL Ratio 01/28/2022 2.7  RATIO Final   VLDL 01/28/2022 13  0 - 40 mg/dL Final   LDL Cholesterol 01/28/2022 67  0 - 99 mg/dL Final   Comment:        Total Cholesterol/HDL:CHD Risk Coronary Heart Disease Risk Table                     Men   Women  1/2 Average Risk   3.4   3.3  Average Risk       5.0   4.4  2 X Average Risk   9.6   7.1  3 X Average Risk  23.4   11.0        Use the calculated Patient Ratio above and the CHD Risk Table to determine the patient's CHD Risk.        ATP III  CLASSIFICATION (LDL):  <100     mg/dL   Optimal  100-129  mg/dL  Near or Above                    Optimal  130-159  mg/dL   Borderline  160-189  mg/dL   High  >190     mg/dL   Very High Performed at [facility] [address] [place], [place] [address]   Hospital Outpatient Visit on 01/28/2022  Component Date Value Ref Range Status   S' Lateral 01/28/2022 3.00  cm Corrected   Area-P 1/2 01/28/2022 2.39  cm2 Corrected    Allergies: Patient has no known allergies.  PTA Medications: (Not in a hospital admission)   Long Term Goals: Improvement in symptoms so as ready for discharge  Short Term Goals: Patient will verbalize feelings in meetings with treatment team members., Patient will attend at least of 50% of the groups daily., Pt will complete the PHQ9 on admission, day 3 and discharge., Patient will participate in completing the [place], Patient will score a low risk of violence for 24 hours prior to discharge, and Patient will take medications as prescribed daily.  Medical Decision Making  Benjamin [name] was [facility] under the service of Hampton Abbot, MD for Substance induced mood disorder [facility]), crisis management, and stabilization. Routine labs ordered, which include Lab Orders         Resp Panel by RT-PCR (Flu A&B, Covid) Anterior Nasal Swab         CBC with Differential/Platelet         Comprehensive metabolic panel         Hemoglobin A1c         Magnesium         Ethanol         Lipid panel         TSH         Prolactin         Urinalysis, Routine w reflex microscopic Urine, Clean Catch         POCT Urine Drug Screen - (I-Screen)         POC SARS Coronavirus 2 Ag    Medication Management: Medications started  atorvastatin  40 mg Oral QHS   diltiazem  360 mg Oral Daily   docusate sodium  100 mg Oral BID   [START ON 07/19/2022] empagliflozin  10 mg Oral  QAC breakfast   ferrous sulfate  325 mg Oral Q1200   gabapentin  300 mg Oral TID   hydrALAZINE  50 mg Oral TID   insulin aspart  0-20 Units Subcutaneous TID WC   Ipratropium-Albuterol  1 puff Inhalation Q6H   losartan  50 mg Oral Daily   magnesium oxide  200 mg Oral BID   metFORMIN  500 mg Oral BID WC   pantoprazole  40 mg Oral BID   potassium chloride  80 mEq Oral Daily   rivaroxaban  20 mg Oral Q supper   spironolactone  50 mg Oral Daily   tamsulosin  0.4 mg Oral QPC supper   torsemide  40 mg Oral BID  PRN medications:  acetaminophen, albuterol, alum & mag hydroxide-simeth, magnesium hydroxide  Will maintain observation checks every 15 minutes for safety. Psychosocial education regarding relapse prevention and self-care; social and communication  Social work will consult with family for collateral information and discuss discharge and follow up plan.     Recommendations  Based on my evaluation the patient  does not appear to have an emergency medical condition.  Adiel Mcnamara, NP 07/18/22  11:18 AM

## 2022-07-18 NOTE — ED Notes (Signed)
Pt sleeping in room. A/o x4. Denies SI/HI/AVH. Denies withdrawal symptoms. Will continue to monitor for safety.

## 2022-07-19 ENCOUNTER — Encounter (HOSPITAL_COMMUNITY): Payer: Self-pay

## 2022-07-19 DIAGNOSIS — F1994 Other psychoactive substance use, unspecified with psychoactive substance-induced mood disorder: Secondary | ICD-10-CM | POA: Diagnosis not present

## 2022-07-19 DIAGNOSIS — F1410 Cocaine abuse, uncomplicated: Secondary | ICD-10-CM | POA: Diagnosis not present

## 2022-07-19 DIAGNOSIS — F1590 Other stimulant use, unspecified, uncomplicated: Secondary | ICD-10-CM | POA: Diagnosis not present

## 2022-07-19 DIAGNOSIS — F1510 Other stimulant abuse, uncomplicated: Secondary | ICD-10-CM | POA: Diagnosis not present

## 2022-07-19 DIAGNOSIS — F1010 Alcohol abuse, uncomplicated: Secondary | ICD-10-CM | POA: Diagnosis not present

## 2022-07-19 LAB — PROLACTIN: Prolactin: 7 ng/mL (ref 4.0–15.2)

## 2022-07-19 LAB — GLUCOSE, CAPILLARY: Glucose-Capillary: 106 mg/dL — ABNORMAL HIGH (ref 70–99)

## 2022-07-19 MED ORDER — UMECLIDINIUM BROMIDE 62.5 MCG/ACT IN AEPB
1.0000 | INHALATION_SPRAY | Freq: Every day | RESPIRATORY_TRACT | Status: DC
  Administered 2022-07-19: 1 via RESPIRATORY_TRACT

## 2022-07-19 MED ORDER — INSULIN ASPART 100 UNIT/ML IJ SOLN: 0.0000 [IU] | Freq: Three times a day (TID) | INTRAMUSCULAR | Status: DC

## 2022-07-19 MED ORDER — FLUTICASONE FUROATE-VILANTEROL 200-25 MCG/ACT IN AEPB
1.0000 | INHALATION_SPRAY | Freq: Every day | RESPIRATORY_TRACT | Status: DC
  Administered 2022-07-19: 1 via RESPIRATORY_TRACT
  Filled 2022-07-19: qty 28

## 2022-07-19 NOTE — Tx Team (Signed)
LCSW met with patient on yesterday 10/30 to assess current mood, affect, physical state, and inquire about needs/goals while here in [facility] and after discharge. Patient reports he presented due to complaints of delusions. Patient denied any SI/HI/VH, however reports he has been hearing voices for 10-15 years. Patient reports the voices tell him to do "everything". During assessment, he reported that the voices were telling him "fuck what their saying", "I ain't trying to hear this shit". Patient denies any thoughts of wanting to harm himself or harm anyone else. Patient reports :"I'm just looking for someone to tell me the simple solution on how to quick cocaine". Patient reports he reports he lives at home alone, and reports no issues with finances. Patient reports being left an inheritance from his mother, and reports frustration that he has been "blowing the money on drugs". Patient reports he needs help at this time and denies having any social support. Patient reports having access to transportation, and reports his goal is to receive intensive outpatient services for substance use. Patient reports he has been to various residential programs with no success, and reports "I believe they are all about money". Patient reports this is the first agency he has been to where he has felt peace and genuine services being provided. Patient aware that LCSW will send referral to the [facility] program to arrange follow up appt. LCSW also encouraged patient to seek grief counseling due to reports of depression related to his mother's death 3 years ago. Patient is agreeable to plan and reports appreciation for LCSW assistance at this time. No other needs were reported.   LCSW will continue to follow and provide support to patient while on [facility]BC unit.   Lucius Conn, LCSW Clinical Social Worker [facility] Ph: [phone]

## 2022-07-19 NOTE — Progress Notes (Signed)
Received Gor this AM asleep in his bed, he woke up ate breakfast and was compliant with his medications. He completed his safety plan and later received his discharge order. The AVS was reviewed with him and questions answered. He received his personal belongings and was discharged without incident at 1224 hrs.

## 2022-07-19 NOTE — ED Provider Notes (Cosign Needed Addendum)
FBC/OBS ASAP Discharge Summary  Date and Time: 07/19/2022 4:00 PM  Name: Benjamin [name]  MRN:  [id number]   Discharge Diagnoses:  Final diagnoses:  Stimulant use disorder    Subjective:  Benjamin [name] is a 56 year old male with no noted past psychiatric history aside from crack cocaine use disorder and mild alcohol use disorder. He has an extensive last medical history. He has been diagnosed with HFpEF, paroxsymal Afib, OSA on BiPAP, COPD on O2 at home. He presented to the [facility] requesting detox and help with substance use (crack and some alcohol). He was admitted to the facility based crisis.   Stay Summary:  The patient declined residential rehab placement saying that he had been to "all of them". He was amenable to a SAIOP program. The earliest possible appointment was given to him, November 6th. On his first night he was noted to snore very loudly and was having apneic episodes. The unit was unable to give him CPAP for various reasons. The patient also reported that his machine at home is BiPAP and that CPAP is not sufficient for him. Given that he had outpatient follow up scheduled and significant medical comorbidities that could not be accommodated on the unit, he was discharged before the typical detox length of time at his request.   The patient denied SI and appeared to be at mild risk of suicide based on his previous report and clinical presentation. He reported auditory hallucinations, chronic in nature, but disturbing to him nonetheless. He was given a psychiatry appointment with Dr. Sande Rives for several weeks from now. He likely needs an antipsychotic. Feel that his crack use is a strong contributor to his hallucinations. Given his abbreviated stay we were unable to start him on a dopamine blocking agent. He was stable for discharge and was discharged in accordance with his wishes.   Total Time spent with patient: 30 minutes  Past Psychiatric History: as above Past Medical  History:  Past Medical History:  Diagnosis Date   A-fib ([place])    Anemia 05/29/2019   CHF (congestive heart failure) (HCC)    Chronic respiratory failure (HCC)    Diabetes mellitus without complication (HCC)    Dyspnea    Elevated troponin 02/06/2019   Hypertension    Obesity    Pulmonary embolism (HCC)    Rectal bleeding 05/29/2019   Sleep apnea     Past Surgical History:  Procedure Laterality Date   CARDIOVERSION N/A 08/21/2020   Procedure: CARDIOVERSION;  Surgeon: Larey Dresser, MD;  Location: [place];  Service: Cardiovascular;  Laterality: N/A;   COLONOSCOPY WITH PROPOFOL Left 02/25/2019   Procedure: COLONOSCOPY WITH PROPOFOL;  Surgeon: Arta Silence, MD;  Location: [place];  Service: Endoscopy;  Laterality: Left;   ESOPHAGOGASTRODUODENOSCOPY (EGD) WITH PROPOFOL Left 02/25/2019   Procedure: ESOPHAGOGASTRODUODENOSCOPY (EGD) WITH PROPOFOL;  Surgeon: Arta Silence, MD;  Location: [facility] ENDOSCOPY;  Service: Endoscopy;  Laterality: Left;   ESOPHAGOGASTRODUODENOSCOPY (EGD) WITH PROPOFOL N/A 06/12/2019   Procedure: ESOPHAGOGASTRODUODENOSCOPY (EGD) WITH PROPOFOL;  Surgeon: Clarene Essex, MD;  Location: [place];  Service: Gastroenterology;  Laterality: N/A;   FLEXIBLE SIGMOIDOSCOPY N/A 06/12/2019   Procedure: FLEXIBLE SIGMOIDOSCOPY;  Surgeon: Clarene Essex, MD;  Location: [place];  Service: Gastroenterology;  Laterality: N/A;   HEMORRHOID BANDING  05/2019   NO PAST SURGERIES     RIGHT HEART CATH N/A 04/17/2019   Procedure: RIGHT HEART CATH;  Surgeon: Larey Dresser, MD;  Location: [place] [facility];  Service: Cardiovascular;  Laterality: N/A;   TEE WITHOUT CARDIOVERSION N/A 08/21/2020   Procedure: TRANSESOPHAGEAL ECHOCARDIOGRAM (TEE);  Surgeon: Larey Dresser, MD;  Location: [facility]OSCOPY;  Service: Cardiovascular;  Laterality: N/A;   Family History:  Family History  Problem Relation Age of Onset   Hypertension Mother    Diabetes Mother    Family Psychiatric History:  as per H and P Social History:  Social History   Substance and Sexual Activity  Alcohol Use Not Currently     Social History   Substance and Sexual Activity  Drug Use Yes   Types: Marijuana, Cocaine    Social History   Socioeconomic History   Marital status: Single    Spouse name: Not on file   Number of children: Not on file   Years of education: Not on file   Highest education level: Not on file  Occupational History   Not on file  Tobacco Use   Smoking status: Former   Smokeless tobacco: Never   Tobacco comments:    smoked weed in the past  Vaping Use   Vaping Use: Never used  Substance and Sexual Activity   Alcohol use: Not Currently   Drug use: Yes    Types: Marijuana, Cocaine   Sexual activity: Not Currently  Other Topics Concern   Not on file  Social History Narrative   Not on file   Social Determinants of Health   Financial Resource Strain: High Risk (02/23/2022)   Overall Financial Resource Strain (CARDIA)    Difficulty of Paying Living Expenses: Hard  Food Insecurity: No Food Insecurity (08/03/2021)   Hunger Vital Sign    Worried About Running Out of Food in the Last Year: Never true    Ran Out of Food in the Last Year: Never true  Transportation Needs: Unmet Transportation Needs (06/30/2022)   PRAPARE - Hydrologist (Medical): Yes    Lack of Transportation (Non-Medical): Yes  Physical Activity: Not on file  Stress: Not on file  Social Connections: Not on file   SDOH:  [place]: No Food Insecurity (08/03/2021)  Housing: High Risk (02/23/2022)  Transportation Needs: Unmet Transportation Needs (06/30/2022)  Utilities: At Risk (07/14/2022)  Depression (PHQ2-9): Medium Risk (03/30/2022)  Financial Resource Strain: High Risk (02/23/2022)  Tobacco Use: Medium Risk (07/18/2022)    Tobacco Cessation:  A prescription for an FDA-approved tobacco cessation medication provided at discharge  Current  Medications:  Current Facility-Administered Medications  Medication Dose Route Frequency Provider Last Rate Last Admin   acetaminophen (TYLENOL) tablet 650 mg  650 mg Oral Q6H PRN Rankin, Shuvon B, NP       albuterol (PROVENTIL) (2.5 MG/3ML) 0.083% nebulizer solution 2.5 mg  2.5 mg Nebulization Q6H PRN Rankin, Shuvon B, NP       alum & mag hydroxide-simeth (MAALOX/MYLANTA) 200-200-20 MG/5ML suspension 30 mL  30 mL Oral Q4H PRN Rankin, Shuvon B, NP       atorvastatin (LIPITOR) tablet 40 mg  40 mg Oral QHS Rankin, Shuvon B, NP   40 mg at 07/18/22 2210   diltiazem (CARDIZEM CD) 24 hr capsule 360 mg  360 mg Oral Daily Rankin, Shuvon B, NP   360 mg at 07/19/22 1031   docusate sodium (COLACE) capsule 100 mg  100 mg Oral BID Rankin, Shuvon B, NP   100 mg at 07/19/22 1031   empagliflozin (JARDIANCE) tablet 10 mg  10 mg Oral QAC breakfast Rankin, Shuvon B, NP  10 mg at 07/19/22 0840   ferrous sulfate tablet 325 mg  325 mg Oral Q1200 Rankin, Shuvon B, NP   325 mg at 07/19/22 1219   fluticasone furoate-vilanterol (BREO ELLIPTA) 200-25 MCG/ACT 1 puff  1 puff Inhalation Daily Corky Sox, MD   1 puff at 07/19/22 1035   And   umeclidinium bromide (INCRUSE ELLIPTA) 62.5 MCG/ACT 1 puff  1 puff Inhalation Daily Corky Sox, MD   1 puff at 07/19/22 1042   gabapentin (NEURONTIN) capsule 300 mg  300 mg Oral TID Rankin, Shuvon B, NP   300 mg at 07/19/22 1031   hydrALAZINE (APRESOLINE) tablet 50 mg  50 mg Oral TID Rankin, Shuvon B, NP   50 mg at 07/19/22 1031   insulin aspart (novoLOG) injection 0-15 Units  0-15 Units Subcutaneous TID WC Corky Sox, MD       Ipratropium-Albuterol (COMBIVENT) respimat 1 puff  1 puff Inhalation Q6H Rankin, Shuvon B, NP   1 puff at 07/19/22 1034   losartan (COZAAR) tablet 50 mg  50 mg Oral Daily Rankin, Shuvon B, NP   50 mg at 07/19/22 1031   magnesium hydroxide (MILK OF MAGNESIA) suspension 30 mL  30 mL Oral Daily PRN Rankin, Shuvon B, NP       magnesium oxide (MAG-OX)  tablet 200 mg  200 mg Oral BID Rankin, Shuvon B, NP   200 mg at 07/19/22 1031   metFORMIN (GLUCOPHAGE) tablet 500 mg  500 mg Oral BID WC Rankin, Shuvon B, NP   500 mg at 07/19/22 0840   pantoprazole (PROTONIX) EC tablet 40 mg  40 mg Oral BID Rankin, Shuvon B, NP   40 mg at 07/19/22 1031   potassium chloride (KLOR-CON) CR tablet 40 mEq  40 mEq Oral BID Hampton Abbot, MD   40 mEq at 07/19/22 1218   rivaroxaban (XARELTO) tablet 20 mg  20 mg Oral Q supper Rankin, Shuvon B, NP   20 mg at 07/18/22 1633   spironolactone (ALDACTONE) tablet 50 mg  50 mg Oral Daily Rankin, Shuvon B, NP   50 mg at 07/19/22 1031   tamsulosin (FLOMAX) capsule 0.4 mg  0.4 mg Oral QPC supper Rankin, Shuvon B, NP   0.4 mg at 07/18/22 1738   torsemide (DEMADEX) tablet 40 mg  40 mg Oral BID Rankin, Shuvon B, NP   40 mg at 07/19/22 1751   Current Outpatient Medications  Medication Sig Dispense Refill   albuterol (PROVENTIL) (2.5 MG/3ML) 0.083% nebulizer solution INHALE 1 VIAL BY NEBULIZATION EVERY 6 (SIX) HOURS AS NEEDED FOR WHEEZING OR SHORTNESS OF BREATH. (Patient taking differently: Take 2.5 mg by nebulization every 6 (six) hours as needed for wheezing or shortness of breath.) 150 mL 1   atorvastatin (LIPITOR) 40 MG tablet Take 1 tablet (40 mg total) by mouth at bedtime. 90 tablet 3   Blood Glucose Monitoring Suppl (TRUE METRIX METER) w/Device KIT Use as instructed. Check blood glucose level by fingerstick twice per day. 1 kit 0   Blood Pressure Monitor DEVI Please provide patient with insurance approved blood pressure device with L-XL cuff. BMI 55 1 each 0   diltiazem (CARDIZEM CD) 360 MG 24 hr capsule Take 1 capsule (360 mg total) by mouth daily. 90 capsule 3   empagliflozin (JARDIANCE) 10 MG TABS tablet Take 1 tablet (10 mg total) by mouth daily before breakfast. 30 tablet 6   FEROSUL 325 (65 Fe) MG tablet TAKE 1 TABLET (325 MG TOTAL) BY MOUTH DAILY AT NOON. (  Patient taking differently: 325 mg daily.) 90 tablet 0   glucose  blood (TRUE METRIX BLOOD GLUCOSE TEST) test strip Use as instructed. Check blood glucose level by fingerstick twice per day. 100 each 12   hydrALAZINE (APRESOLINE) 50 MG tablet Take 1 tablet (50 mg total) by mouth 3 (three) times daily. 180 tablet 3   Ipratropium-Albuterol (COMBIVENT RESPIMAT) 20-100 MCG/ACT AERS respimat Inhale 1 puff into the lungs every 6 (six) hours. 4 g 5   losartan (COZAAR) 50 MG tablet Take 1 tablet (50 mg total) by mouth daily. 90 tablet 3   magnesium oxide (MAG-OX) 400 (240 Mg) MG tablet TAKE 1 TABLET (400 MG TOTAL) BY MOUTH 2 (TWO) TIMES DAILY (NOON+BEDTIME) (Patient taking differently: Take 400 mg by mouth 2 (two) times daily.) 90 tablet 3   metFORMIN (GLUCOPHAGE) 500 MG tablet Take 1 tablet (500 mg total) by mouth 2 (two) times daily with a meal. 180 tablet 1   omeprazole (PRILOSEC) 20 MG capsule TAKE 1 CAPSULE 2 (TWO) TIMES DAILY BEFORE A MEAL. TAKE FIRST CAPSULE 30 MIN PRIOR TO EATING OR TAKING OTHER MEDICATIONS. (AM+BEDTIME) (Patient taking differently: 20 mg 2 (two) times daily before a meal.) 180 capsule 1   potassium chloride (KLOR-CON) 10 MEQ tablet Take 8 tablets (80 mEq total) by mouth daily. (Patient taking differently: Take 40 mEq by mouth 2 (two) times daily. Take four tablets at noon and four tablets in the evening.) 240 tablet 11   rivaroxaban (XARELTO) 20 MG TABS tablet Take 1 tablet (20 mg total) by mouth daily with supper. 90 tablet 3   spironolactone (ALDACTONE) 50 MG tablet Take 1 tablet (50 mg total) by mouth daily. 30 tablet 11   tamsulosin (FLOMAX) 0.4 MG CAPS capsule Take 0.4 mg by mouth daily after supper.     torsemide (DEMADEX) 20 MG tablet Take 2 tablets (40 mg total) by mouth 2 (two) times daily. 60 tablet 11   TRUEplus Lancets 28G MISC Use as instructed. Check blood glucose level by fingerstick twice per day. 200 each 3    PTA Medications: (Not in a hospital admission)      03/30/2022   11:03 AM 12/21/2020   10:07 AM 04/30/2019    9:43 AM   Depression screen PHQ 2/9  Decreased Interest 1 0 3  Down, Depressed, Hopeless 1 0 2  PHQ - 2 Score 2 0 5  Altered sleeping 1 0 3  Tired, decreased energy 3 0 3  Change in appetite 1 0 2  Feeling bad or failure about yourself  0 0 2  Trouble concentrating 0 0 2  Moving slowly or fidgety/restless 1 0 3  Suicidal thoughts 0 0 0  PHQ-9 Score 8 0 20    Flowsheet Row ED from 07/18/2022 in [facility] ED from 02/16/2022 in [place] ED from 07/20/2021 in [facility] [place] No Risk No Risk No Risk       Musculoskeletal  Strength & Muscle Tone: within normal limits Gait & Station: normal Patient leans: none  Psychiatric Specialty Exam  Presentation  General Appearance:  Casual  Eye Contact: Fair  Speech: Clear and Coherent  Speech Volume: normal  Mood and Affect  Mood: Euthymic  Affect: Appropriate; Congruent   Thought Process  Thought Processes: Coherent; Goal Directed  Descriptions of Associations:Intact  Orientation:Full (Time, Place and Person)  Thought Content:Logical; WDL  Diagnosis of Schizophrenia or Schizoaffective disorder in  past: No    Hallucinations:none Ideas of Reference:None  Suicidal Thoughts: none Homicidal Thoughts: none  Sensorium  Memory: Immediate Fair; Recent Fair  Judgment: Fair  Insight: fair   Executive Functions  Concentration: Fair  Attention Span: Fair  Recall: Hatch of Knowledge: Fair  Language: Fair   Psychomotor Activity  Psychomotor Activity: normal  Assets  Assets: Desire for Improvement; Resilience   Sleep  Sleep: fair    Nutritional Assessment (For OBS and FBC admissions only) Has the patient had a weight loss or gain of 10 pounds or more in the last 3 months?: No Has the patient had a decrease in food intake/or appetite?: No Does the patient have dental problems?:  No Does the patient have eating habits or behaviors that may be indicators of an eating disorder including binging or inducing vomiting?: No Has the patient recently lost weight without trying?: 0 Has the patient been eating poorly because of a decreased appetite?: 0 Malnutrition Screening Tool Score: 0    Physical Exam  Physical Exam Constitutional:      Appearance: the patient is not toxic-appearing.  Pulmonary:     Effort: Pulmonary effort is normal.  Neurological:     General: No focal deficit present.     Mental Status: the patient is alert and oriented to person, place, and time.   Review of Systems  Respiratory:  Negative for shortness of breath.   Cardiovascular:  Negative for chest pain.  Gastrointestinal:  Negative for abdominal pain, constipation, diarrhea, nausea and vomiting.  Neurological:  Negative for headaches.   Blood pressure (!) 138/96, pulse 77, temperature 97.9 F (36.6 C), temperature source Oral, resp. rate 18, SpO2 100 %. There is no height or weight on file to calculate BMI.  Demographic Factors:  Male  Loss Factors: NA  Historical Factors: NA  Risk Reduction Factors:   Positive social support, Positive therapeutic relationship, and Positive coping skills or problem solving skills  Continued Clinical Symptoms:  Alcohol/Substance Abuse/Dependencies  Cognitive Features That Contribute To Risk:  None    Suicide Risk:  Mild: notable risk factors include substance use problems and medical problems. He reports having a significant amount of money, good transportation, and social support.    Plan Of Care/Follow-up recommendations:  Activity: as tolerated  Diet: heart healthy  Other: -Follow-up with your outpatient psychiatric provider - information provided in the discharge instructions.   -Take your psychiatric medications as prescribed at discharge - instructions are provided to you in the discharge paperwork.  -Follow-up with outpatient  primary care doctor and other specialists -for management of preventative medicine and chronic medical disease.   -Recommend abstinence from alcohol, tobacco, and other illicit drug use at discharge.   -If your psychiatric symptoms recur, worsen, or if you have side effects to your psychiatric medications, call your outpatient psychiatric provider, 911, [phone] or go to the nearest emergency department.   Disposition:  Self-care, home  Corky Sox, MD 07/19/2022, 4:00 PM

## 2022-07-19 NOTE — ED Notes (Signed)
Patient refused to attend group, he wanted to stay in bed and sleep instead.

## 2022-07-19 NOTE — Discharge Instructions (Addendum)
You have been scheduled for an appointment with a psychiatrist for: Tuesday, November 14th at 10 AM At the [facility] [address] in [place] Call [phone] if you have questions   Please continue weekly outpatient therapy with [facility] [place]; Additional resources have been added for your review:   [facility] [place], [place], [address] [phone] phone  New Patient Assessment/Therapy Walk-Ins:  Monday and Wednesday: 8 am until slots are full. Every 1st and 2nd Fridays of the month: 1 pm - 5 pm.  NO ASSESSMENT/THERAPY WALK-INS ON [place]  New Patient Assessment/Medication Management Walk-Ins:  Monday - Friday:  8 am - 11 am.  For all walk-ins, we ask that you arrive by 7:30 am because patients will be seen in the order of arrival.  Availability is limited; therefore, you may not be seen on the same day that you walk-in.  Our goal is to serve and meet the needs of our community to the best of our ability.  Outpatient Substance Use Treatment  [facility] [place], [place], [address] [phone] phone  [facility] [address] [place], [place], [address] [phone] phone [phone] fax NOTE: Does have Substance Abuse-Intensive Outpatient Program [facility]) as well as transitional housing if eligible.  [place], [place], [address] [phone] phone [phone] fax  [facility] [phone] [place], [place], [address] [phone] phone [phone] fax  12 STEP PROGRAMS:  Alcoholics Anonymous of [facility] ReportZoo.com.cy  Narcotics Anonymous of [place] GreenScrapbooking.dk  Al-Anon of [place], [place] www.[place]alanon.org/find-meetings.html  Nar-Anon https://nar-anon.org/find-a-meetin  Therapy for No  Insurance/IPRS Based on observation and your report, outpatient services with psychiatry and therapy have been recommended.  It is imperative to your mental health that seek out services 7-10 day from your day of discharge to prevent another crisis. A list of referrals has been provided below based on your needs and recommendations.  You are not limited to the list provided.  In case of an urgent crisis, you may contact the Mobile Crisis Unit with [facility] at [phone].   You have the option to call 211 for assistance in finding additional mental health agencies if these do not meet your needs.  [facility] [address] [place], [place], [address] [phone] phone ([place], [place]; for individuals without insurance, please call and speak to our staff)  [place], [place], [place] [address]      [phone]        [facility]      [place], [place], [address]      [phone] phone to set up initial appointment    Patient is instructed prior to discharge to:  Take all medications as prescribed by his/her mental healthcare provider. Report any adverse effects and or reactions from the medicines to his/her outpatient provider promptly. Keep all scheduled appointments, to ensure that you are getting refills on time and to avoid any interruption in your medication.  If you are unable to keep an appointment call to reschedule.  Be sure to follow-up with resources and follow-up appointments provided.  Patient has been instructed & cautioned: To not engage in alcohol and or  illegal drug use while on prescription medicines. In the event of worsening symptoms, patient is instructed to call the crisis hotline, 911 and or go to the nearest ED for appropriate evaluation and treatment of symptoms. To follow-up with his/her primary care  provider for your other medical issues, concerns and or health care needs.

## 2022-07-19 NOTE — ED Notes (Signed)
Pt sleeping. No noted distress. Will continue to monitor for safety 

## 2022-07-19 NOTE — BH IP Treatment Plan (Signed)
Interdisciplinary Treatment and Diagnostic Plan Update  07/19/2022 Time of Session: 2:35pm REVIS WHALIN MRN: [id number]  Diagnosis:  Final diagnoses:  Substance induced mood disorder ([place])  Polysubstance use disorder     Current Medications:  Current Facility-Administered Medications  Medication Dose Route Frequency Provider Last Rate Last Admin   acetaminophen (TYLENOL) tablet 650 mg  650 mg Oral Q6H PRN Rankin, Shuvon B, NP       albuterol (PROVENTIL) (2.5 MG/3ML) 0.083% nebulizer solution 2.5 mg  2.5 mg Nebulization Q6H PRN Rankin, Shuvon B, NP       alum & mag hydroxide-simeth (MAALOX/MYLANTA) 200-200-20 MG/5ML suspension 30 mL  30 mL Oral Q4H PRN Rankin, Shuvon B, NP       atorvastatin (LIPITOR) tablet 40 mg  40 mg Oral QHS Rankin, Shuvon B, NP   40 mg at 07/18/22 2210   diltiazem (CARDIZEM CD) 24 hr capsule 360 mg  360 mg Oral Daily Rankin, Shuvon B, NP   360 mg at 07/18/22 1311   docusate sodium (COLACE) capsule 100 mg  100 mg Oral BID Rankin, Shuvon B, NP   100 mg at 07/18/22 2210   empagliflozin (JARDIANCE) tablet 10 mg  10 mg Oral QAC breakfast Rankin, Shuvon B, NP       ferrous sulfate tablet 325 mg  325 mg Oral Q1200 Rankin, Shuvon B, NP   325 mg at 07/18/22 1308   gabapentin (NEURONTIN) capsule 300 mg  300 mg Oral TID Rankin, Shuvon B, NP   300 mg at 07/18/22 2214   hydrALAZINE (APRESOLINE) tablet 50 mg  50 mg Oral TID Rankin, Shuvon B, NP   50 mg at 07/18/22 2214   insulin aspart (novoLOG) injection 0-15 Units  0-15 Units Subcutaneous TID WC Corky Sox, MD       Ipratropium-Albuterol (COMBIVENT) respimat 1 puff  1 puff Inhalation Q6H Rankin, Shuvon B, NP   1 puff at 07/19/22 0544   losartan (COZAAR) tablet 50 mg  50 mg Oral Daily Rankin, Shuvon B, NP   50 mg at 07/18/22 1309   magnesium hydroxide (MILK OF MAGNESIA) suspension 30 mL  30 mL Oral Daily PRN Rankin, Shuvon B, NP       magnesium oxide (MAG-OX) tablet 200 mg  200 mg Oral BID Rankin, Shuvon B, NP   200  mg at 07/18/22 2212   metFORMIN (GLUCOPHAGE) tablet 500 mg  500 mg Oral BID WC Rankin, Shuvon B, NP   500 mg at 07/18/22 1632   pantoprazole (PROTONIX) EC tablet 40 mg  40 mg Oral BID Rankin, Shuvon B, NP   40 mg at 07/18/22 2210   potassium chloride (KLOR-CON) CR tablet 40 mEq  40 mEq Oral BID Hampton Abbot, MD   40 mEq at 07/18/22 2210   rivaroxaban (XARELTO) tablet 20 mg  20 mg Oral Q supper Rankin, Shuvon B, NP   20 mg at 07/18/22 1633   spironolactone (ALDACTONE) tablet 50 mg  50 mg Oral Daily Rankin, Shuvon B, NP   50 mg at 07/18/22 1310   tamsulosin (FLOMAX) capsule 0.4 mg  0.4 mg Oral QPC supper Rankin, Shuvon B, NP   0.4 mg at 07/18/22 1738   torsemide (DEMADEX) tablet 40 mg  40 mg Oral BID Rankin, Shuvon B, NP   40 mg at 07/18/22 1738   Current Outpatient Medications  Medication Sig Dispense Refill   albuterol (PROVENTIL) (2.5 MG/3ML) 0.083% nebulizer solution INHALE 1 VIAL BY NEBULIZATION EVERY 6 (SIX) HOURS AS NEEDED FOR  WHEEZING OR SHORTNESS OF BREATH. (Patient taking differently: Take 2.5 mg by nebulization every 6 (six) hours as needed for wheezing or shortness of breath.) 150 mL 1   atorvastatin (LIPITOR) 40 MG tablet Take 1 tablet (40 mg total) by mouth at bedtime. 90 tablet 3   Blood Glucose Monitoring Suppl (TRUE METRIX METER) w/Device KIT Use as instructed. Check blood glucose level by fingerstick twice per day. 1 kit 0   Blood Pressure Monitor DEVI Please provide patient with insurance approved blood pressure device with L-XL cuff. BMI 55 1 each 0   diltiazem (CARDIZEM CD) 360 MG 24 hr capsule Take 1 capsule (360 mg total) by mouth daily. 90 capsule 3   empagliflozin (JARDIANCE) 10 MG TABS tablet Take 1 tablet (10 mg total) by mouth daily before breakfast. 30 tablet 6   FEROSUL 325 (65 Fe) MG tablet TAKE 1 TABLET (325 MG TOTAL) BY MOUTH DAILY AT NOON. (Patient taking differently: 325 mg daily.) 90 tablet 0   glucose blood (TRUE METRIX BLOOD GLUCOSE TEST) test strip Use as  instructed. Check blood glucose level by fingerstick twice per day. 100 each 12   hydrALAZINE (APRESOLINE) 50 MG tablet Take 1 tablet (50 mg total) by mouth 3 (three) times daily. 180 tablet 3   Ipratropium-Albuterol (COMBIVENT RESPIMAT) 20-100 MCG/ACT AERS respimat Inhale 1 puff into the lungs every 6 (six) hours. 4 g 5   losartan (COZAAR) 50 MG tablet Take 1 tablet (50 mg total) by mouth daily. 90 tablet 3   magnesium oxide (MAG-OX) 400 (240 Mg) MG tablet TAKE 1 TABLET (400 MG TOTAL) BY MOUTH 2 (TWO) TIMES DAILY (NOON+BEDTIME) (Patient taking differently: Take 400 mg by mouth 2 (two) times daily.) 90 tablet 3   metFORMIN (GLUCOPHAGE) 500 MG tablet Take 1 tablet (500 mg total) by mouth 2 (two) times daily with a meal. 180 tablet 1   omeprazole (PRILOSEC) 20 MG capsule TAKE 1 CAPSULE 2 (TWO) TIMES DAILY BEFORE A MEAL. TAKE FIRST CAPSULE 30 MIN PRIOR TO EATING OR TAKING OTHER MEDICATIONS. (AM+BEDTIME) (Patient taking differently: 20 mg 2 (two) times daily before a meal.) 180 capsule 1   potassium chloride (KLOR-CON) 10 MEQ tablet Take 8 tablets (80 mEq total) by mouth daily. 240 tablet 11   rivaroxaban (XARELTO) 20 MG TABS tablet Take 1 tablet (20 mg total) by mouth daily with supper. 90 tablet 3   spironolactone (ALDACTONE) 50 MG tablet Take 1 tablet (50 mg total) by mouth daily. 30 tablet 11   tamsulosin (FLOMAX) 0.4 MG CAPS capsule Take 0.4 mg by mouth daily after supper.     torsemide (DEMADEX) 20 MG tablet Take 2 tablets (40 mg total) by mouth 2 (two) times daily. 60 tablet 11   TRUEplus Lancets 28G MISC Use as instructed. Check blood glucose level by fingerstick twice per day. 200 each 3   PTA Medications: Prior to Admission medications   Medication Sig Start Date End Date Taking? Authorizing Provider  albuterol (PROVENTIL) (2.5 MG/3ML) 0.083% nebulizer solution INHALE 1 VIAL BY NEBULIZATION EVERY 6 (SIX) HOURS AS NEEDED FOR WHEEZING OR SHORTNESS OF BREATH. Patient taking differently: Take 2.5  mg by nebulization every 6 (six) hours as needed for wheezing or shortness of breath. 04/12/22  Yes [name], NP  atorvastatin (LIPITOR) 40 MG tablet Take 1 tablet (40 mg total) by mouth at bedtime. 04/15/22  Yes Milford, Maricela Bo, FNP  Blood Glucose Monitoring Suppl (TRUE METRIX METER) w/Device KIT Use as instructed. Check blood glucose  level by fingerstick twice per day. 03/18/19  Yes [name], NP  Blood Pressure Monitor DEVI Please provide patient with insurance approved blood pressure device with L-XL cuff. BMI 55 10/04/19  Yes [name], NP  diltiazem (CARDIZEM CD) 360 MG 24 hr capsule Take 1 capsule (360 mg total) by mouth daily. 11/02/21  Yes Milford, Maricela Bo, FNP  empagliflozin (JARDIANCE) 10 MG TABS tablet Take 1 tablet (10 mg total) by mouth daily before breakfast. 03/30/22  Yes Milford, Jessica M, FNP  FEROSUL 325 (65 Fe) MG tablet TAKE 1 TABLET (325 MG TOTAL) BY MOUTH DAILY AT NOON. Patient taking differently: 325 mg daily. 06/09/22  Yes Newlin, Charlane Ferretti, MD  glucose blood (TRUE METRIX BLOOD GLUCOSE TEST) test strip Use as instructed. Check blood glucose level by fingerstick twice per day. 09/22/20  Yes [name], NP  hydrALAZINE (APRESOLINE) 50 MG tablet Take 1 tablet (50 mg total) by mouth 3 (three) times daily. 11/02/21  Yes Milford, Jessica M, FNP  Ipratropium-Albuterol (COMBIVENT RESPIMAT) 20-100 MCG/ACT AERS respimat Inhale 1 puff into the lungs every 6 (six) hours. 03/11/22  Yes [name], MD  losartan (COZAAR) 50 MG tablet Take 1 tablet (50 mg total) by mouth daily. 11/02/21  Yes Milford, Maricela Bo, FNP  magnesium oxide (MAG-OX) 400 (240 Mg) MG tablet TAKE 1 TABLET (400 MG TOTAL) BY MOUTH 2 (TWO) TIMES DAILY (NOON+BEDTIME) Patient taking differently: Take 400 mg by mouth 2 (two) times daily. 06/09/22  Yes Milford, Maricela Bo, FNP  metFORMIN (GLUCOPHAGE) 500 MG tablet Take 1 tablet (500 mg total) by mouth 2 (two) times daily with a meal. 05/12/22  Yes [name], NP  omeprazole (PRILOSEC) 20 MG capsule TAKE 1 CAPSULE 2 (TWO) TIMES DAILY BEFORE A MEAL. TAKE FIRST CAPSULE 30 MIN PRIOR TO EATING OR TAKING OTHER MEDICATIONS. (AM+BEDTIME) Patient taking differently: 20 mg 2 (two) times daily before a meal. 04/12/22  Yes [name], NP  potassium chloride (KLOR-CON) 10 MEQ tablet Take 8 tablets (80 mEq total) by mouth daily. 11/02/21  Yes Milford, Maricela Bo, FNP  rivaroxaban (XARELTO) 20 MG TABS tablet Take 1 tablet (20 mg total) by mouth daily with supper. 11/02/21  Yes Milford, Maricela Bo, FNP  spironolactone (ALDACTONE) 50 MG tablet Take 1 tablet (50 mg total) by mouth daily. 11/02/21  Yes Milford, Maricela Bo, FNP  tamsulosin (FLOMAX) 0.4 MG CAPS capsule Take 0.4 mg by mouth daily after supper.   Yes [provider]  torsemide (DEMADEX) 20 MG tablet Take 2 tablets (40 mg total) by mouth 2 (two) times daily. 11/02/21  Yes Milford, Maricela Bo, FNP  TRUEplus Lancets 28G MISC Use as instructed. Check blood glucose level by fingerstick twice per day. 09/22/20  Yes [name], NP    Patient Stressors: Health problems   Substance abuse    Patient Strengths: Ability for insight  Active sense of humor  Average or above average intelligence  Capable of independent living  Communication skills  General fund of knowledge   Treatment Modalities: Medication Management, Group therapy, Case management,  1 to 1 session with clinician, Psychoeducation, Recreational therapy.   Physician Treatment Plan for Primary and Secondary Diagnosis:  Final diagnoses:  Substance induced mood disorder ([place])  Polysubstance use disorder   Long Term Goal(s): Improvement in symptoms so as ready for discharge  Short Term Goals: Patient will verbalize feelings in meetings with treatment team members. Patient will attend at least of 50% of the groups  daily. Pt will complete the PHQ9 on admission, day 3 and discharge. Patient will participate in completing the  [place] Patient will score a low risk of violence for 24 hours prior to discharge Patient will take medications as prescribed daily.  Medication Management: Evaluate patient's response, side effects, and tolerance of medication regimen.  Therapeutic Interventions: 1 to 1 sessions, Unit Group sessions and Medication administration.  Evaluation of Outcomes: Progressing  LCSW Treatment Plan for Primary Diagnosis:  Final diagnoses:  Substance induced mood disorder ([place])  Polysubstance use disorder    Long Term Goal(s): Safe transition to appropriate next level of care at discharge.  Short Term Goals: Facilitate acceptance of mental health diagnosis and concerns through verbal commitment to aftercare plan and appointments at discharge., Patient will identify one social support prior to discharge to aid in patient's recovery., Patient will attend AA/NA groups as scheduled., Identify minimum of 2 triggers associated with mental health/substance abuse issues with treatment team members., and Increase skills for wellness and recovery by attending 50% of scheduled groups.  Therapeutic Interventions: Assess for all discharge needs, 1 to 1 time with Education officer, museum, Explore available resources and support systems, Assess for adequacy in community support network, Educate family and significant other(s) on suicide prevention, Complete Psychosocial Assessment, Interpersonal group therapy.  Evaluation of Outcomes: Progressing   Progress in Treatment: Attending groups: Yes. Participating in groups: Yes. Taking medication as prescribed: Yes. Toleration medication: Yes. Family/Significant other contact made: No, will contact:  Patient did not provide permission for Clinician to speak to anyone else to gain collateral.  Patient understands diagnosis: Yes. Discussing patient identified problems/goals with staff: Yes. Medical problems stabilized or resolved: Yes. Denies  suicidal/homicidal ideation: Yes. Issues/concerns per patient self-inventory: Yes. Other: Patient voices his desire to stop using cocaine and reports "wishing someone would give him a simple solution to stop using it".   New problem(s) identified: No, Describe:  other than issues reported on admission.  New Short Term/Long Term Goal(s): Safe transition to appropriate next level of care at discharge, Engage patient in therapeutic group addressing interpersonal concerns. Engage patient in aftercare planning with referrals and resources, Increase ability to appropriately verbalize feelings, Facilitate acceptance of mental health diagnosis and concerns and Identify triggers associated with mental health/substance abuse issues.   Patient Goals: Patient reports at this time he has no interest in residential placement. Patient reports he has been to various treatment facilities in the past with no success. Patient reports "feeling like most facilities are just about money and not about recovery". Patient reports he is interest in intensive outpatient substance abuse treatment at this time. Patient reports a desire to want to attend groups daily and to do things that will keep him active. Patient was also informed that LCSW could search for grief counselors in the area and provide those resources within his AVS. Patient expressed appreciation and is agreeable to plan.   Discharge Plan or Barriers: home with Intensive outpatient substance abuse treatment services in place; grief counseling   Reason for Continuation of Hospitalization: Depression Medication stabilization  Estimated Length of Stay: 3-5 days  Last 3 [place] Suicide Severity Risk Score: [place] [facility] from [facility]18/2022 in [facility] from 02/16/2022 in [place] [facility] from 07/20/2021 in [facility] [place] No Risk No Risk No Risk        Last [facility] 2/9 Scores:    03/30/2022  11:03 AM 12/21/2020   10:07 AM 04/30/2019    9:43 AM  Depression screen PHQ 2/9  Decreased Interest 1 0 3  Down, Depressed, Hopeless 1 0 2  PHQ - 2 Score 2 0 5  Altered sleeping 1 0 3  Tired, decreased energy 3 0 3  Change in appetite 1 0 2  Feeling bad or failure about yourself  0 0 2  Trouble concentrating 0 0 2  Moving slowly or fidgety/restless 1 0 3  Suicidal thoughts 0 0 0  PHQ-9 Score 8 0 20    Scribe for Treatment Team: Gigi Gin 07/19/2022 8:17 AM

## 2022-07-20 ENCOUNTER — Telehealth (HOSPITAL_COMMUNITY): Payer: Self-pay | Admitting: Licensed Clinical Social Worker

## 2022-07-20 NOTE — ED Provider Notes (Signed)
Scheduled outpatient psychiatrist appointment for the patient as discussed with him before discharge. Attempted to contact the patient and inform him about appointment details. Both listed numbers are disconnected. Unable to reach him.   Corky Sox, MD PGY-2

## 2022-07-22 NOTE — Telephone Encounter (Signed)
H&V Care Navigation CSW Progress Note  Clinical Social Worker helped to create careplan for pt and sent to lawyers office for him to have at parole hearing this Friday.  Careplan just details what appts patient was given following [place] visit and that [place] will help him to keep these appts and be an additional support to him in maintaining his goals for sobriety and consistent mental health follow up.  Patient is participating in a Managed Medicaid Plan:  Yes  [place]: No Food Insecurity (08/03/2021)  Housing: High Risk (02/23/2022)  Transportation Needs: Unmet Transportation Needs (06/30/2022)  Utilities: At Risk (07/14/2022)  Depression (PHQ2-9): High Risk (07/19/2022)  Financial Resource Strain: High Risk (02/23/2022)  Tobacco Use: Medium Risk (07/18/2022)    [name], LCSW Clinical Social Worker [facility] Desk#: [phone] Cell#: [phone]

## 2022-07-25 ENCOUNTER — Telehealth (HOSPITAL_COMMUNITY): Payer: Self-pay | Admitting: Licensed Clinical Social Worker

## 2022-07-25 ENCOUNTER — Ambulatory Visit (INDEPENDENT_AMBULATORY_CARE_PROVIDER_SITE_OTHER): Payer: Medicaid Other | Admitting: Licensed Clinical Social Worker

## 2022-07-25 DIAGNOSIS — F1990 Other psychoactive substance use, unspecified, uncomplicated: Secondary | ICD-10-CM

## 2022-07-25 NOTE — Progress Notes (Signed)
Comprehensive Clinical Assessment (CCA) Note  07/25/2022 WREN MERGES [id number]  Chief Complaint: No chief complaint on file.  Visit Diagnosis: Polysubstance use disorder     CCA Screening, Triage and Referral (STR)  Patient Reported Information How did you hear about [place]? Self  Referral name: No data recorded Referral phone number: No data recorded  Whom do you see for routine medical problems? No data recorded Practice/Facility Name: No data recorded Practice/Facility Phone Number: No data recorded Name of Contact: No data recorded Contact Number: No data recorded Contact Fax Number: No data recorded Prescriber Name: No data recorded Prescriber Address (if known): No data recorded  What Is the Reason for Your Visit/Call Today? Delusional; Bralin Shehee, Routine; 56 year old presents to [facility] voluntarily and unaccompanied with complaints of delusional.  Pt denies SI, HI or AVH.  Pt reports drinking alcohol, smoking both cocaine and marijuana on 07/17/22.  Pt reports his mother was standing behind the clinician this morning, "I want to go with my deceased mom". Pt reports "my mom is telling me to come with her now".   Pt admits to prior MH diagnosis or prescribed medication for symptom management.  MSE signed by patient.  How Long Has This Been Causing You Problems? 1 wk - 1 month  What Do You Feel Would Help You the Most Today? Alcohol or Drug Use Treatment; Treatment for Depression or other mood problem; Medication(s); Stress Management   Have You Recently Been in Any Inpatient Treatment (Hospital/Detox/Crisis Center/28-Day Program)? No data recorded Name/Location of Program/Hospital:No data recorded How Long Were You There? No data recorded When Were You Discharged? No data recorded  Have You Ever Received Services From [facility] Before? No data recorded Who Do You See at [facility]? No data recorded  Have You Recently Had Any Thoughts About Hurting Yourself?  No  Are You Planning to Commit Suicide/Harm Yourself At This time? No   Have you Recently Had Thoughts About [place]? No  Explanation: No data recorded  Have You Used Any Alcohol or Drugs in the Past 24 Hours? Yes  How Long Ago Did You Use Drugs or Alcohol? No data recorded What Did You Use and How Much? Alcohol, Marijuana, Cocaine   Do You Currently Have a Therapist/Psychiatrist? No data recorded Name of Therapist/Psychiatrist: No data recorded  Have You Been Recently Discharged From Any Office Practice or Programs? No data recorded Explanation of Discharge From Practice/Program: No data recorded    CCA Screening Triage Referral Assessment Type of Contact: No data recorded Is this Initial or Reassessment? No data recorded Date Telepsych consult ordered in [facility]:  No data recorded Time Telepsych consult ordered in [facility]:  No data recorded  Patient Reported Information Reviewed? No data recorded Patient Left Without Being Seen? No data recorded Reason for Not Completing Assessment: No data recorded  Collateral Involvement: No data recorded  Does Patient Have a [place]? No data recorded Name and Contact of Legal Guardian: No data recorded If Minor and Not Living with Parent(s), Who has Custody? No data recorded Is CPS involved or ever been involved? No data recorded Is APS involved or ever been involved? No data recorded  Patient Determined To Be At Risk for Harm To Self or Others Based on Review of Patient Reported Information or Presenting Complaint? No data recorded Method: No data recorded Availability of Means: No data recorded Intent: No data recorded Notification Required: No data recorded Additional Information for Danger to Others  Potential: No data recorded Additional Comments for Danger to Others Potential: No data recorded Are There Guns or Other Weapons in [place]? No data recorded Types of Guns/Weapons: No data  recorded Are These Weapons Safely Secured?                            No data recorded Who Could Verify You Are Able To Have These Secured: No data recorded Do You Have any Outstanding Charges, Pending Court Dates, Parole/Probation? No data recorded Contacted To Inform of Risk of Harm To Self or Others: No data recorded  Location of Assessment: No data recorded  Does Patient Present under Involuntary Commitment? No data recorded IVC Papers Initial File Date: No data recorded  [place] of Residence: No data recorded  Patient Currently Receiving the Following Services: No data recorded  Determination of Need: Routine (7 days)   Options For Referral: Medication Management     CCA Biopsychosocial Intake/Chief Complaint:  Mr. Bankson presents for CCA after being referred for CD-IOP. He reports that he was seen in [facility] on 07/18/2022 for substance use and delusions. He reports past alcohol, marijuana and cocaine use and reports that his last use was 07/21/2022 and reports that he had court on 07/22/2022.  Current Symptoms/Problems: No data recorded  Patient Reported Schizophrenia/Schizoaffective Diagnosis in Past: No data recorded  Strengths: Vocal, has transportation, willing to come to treatment, has some support, can handle own ADLs  Preferences: Treatment  Abilities: No data recorded  Type of Services Patient Feels are Needed: CD-IOP   Initial Clinical Notes/Concerns: He reports that his mother died two and a half to three years ago and he was there in the hospital with her when she died. He reports that he has two children a son age, 69 and their relationship is distant. He reports that he has a brother and sister but there relationship is strained.   Mental Health Symptoms Depression:  No data recorded  Duration of Depressive symptoms: No data recorded  Mania:   None   Anxiety:    None   Psychosis:   Hallucinations   Duration of Psychotic symptoms: No data  recorded  Trauma:   Re-experience of traumatic event (Has flashes of seeing his mother on her dying bed.)   Obsessions:   None   Compulsions:   None   Inattention:   None   Hyperactivity/Impulsivity:   None   Oppositional/Defiant Behaviors:   None   Emotional Irregularity:   Mood lability; Intense/inappropriate anger   Other Mood/Personality Symptoms:  No data recorded   Mental Status Exam Appearance and self-care  Stature:   Average   Weight:   Overweight   Clothing:   Casual   Grooming:   Normal   Cosmetic use:   None   Posture/gait:   Normal   Motor activity:   Not Remarkable   Sensorium  Attention:   Normal   Concentration:   Normal   Orientation:   X5   Recall/memory:   Normal   Affect and Mood  Affect:   Appropriate   Mood:   Euthymic   Relating  Eye contact:   Normal   Facial expression:   Responsive   Attitude toward examiner:   Cooperative   Thought and Language  Speech flow:  Normal   Thought content:   Appropriate to Mood and Circumstances   Preoccupation:   None   Hallucinations:   None  Organization:  No data recorded  [employer] of Knowledge:   Good   Intelligence:   Average   Abstraction:   Normal   Judgement:   Good   Reality Testing:   Realistic   Insight:   None/zero insight   Decision Making:   Normal   Social Functioning  Social Maturity:   Responsible   Social Judgement:   Normal   Stress  Stressors:   Legal   Coping Ability:   Normal   Skill Deficits:   None   Supports:   Support needed; Other (Comment) (Education officer, museum)     Religion: Religion/Spirituality Are You A Religious Person?: Yes What is Your Religious Affiliation?: Baptist  Leisure/Recreation:    Exercise/Diet: Exercise/Diet Do You Exercise?: No Have You Gained or Lost A Significant Amount of Weight in the Past Six Months?: Yes-Lost Number of Pounds Lost?: 100 Do You Follow a  Special Diet?: No Do You Have Any Trouble Sleeping?: No   CCA Employment/Education Employment/Work Situation: Employment / Work Technical sales engineer: On disability Why is Patient on Disability: Medical conditions How Long has Patient Been on Disability: 2.5 years 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, welding Has Patient ever Been in the [employer]?: No  Education:     CCA Family/Childhood History Family and Relationship History:    Childhood History:     Child/Adolescent Assessment:     CCA Substance Use Alcohol/Drug Use: Alcohol / Drug Use Pain Medications: MAR Prescriptions: MAR Over the Counter: MAR History of alcohol / drug use?: Yes Longest period of sobriety (when/how long): 2 days Negative Consequences of Use: Personal relationships, Legal, Financial Withdrawal Symptoms: Agitation, Irritability Substance #1 Name of Substance 1: Cocaine 1 - Age of First Use: 20s 1 - Amount (size/oz): 1-1.5 oz 1 - Frequency: 5-7 times a week 1 - Duration: Since 2021 1 - Last Use / Amount: 07/23/2022 1 - Method of Aquiring: Buying 1- Route of Use: Smoking                       ASAM's:  Six Dimensions of Multidimensional Assessment  Dimension 1:  Acute Intoxication and/or Withdrawal Potential:   Dimension 1:  Description of individual's past and current experiences of substance use and withdrawal: Minimal  Dimension 2:  Biomedical Conditions and Complications:   Dimension 2:  Description of patient's biomedical conditions and  complications: Heart condition  Dimension 3:  Emotional, Behavioral, or Cognitive Conditions and Complications:  Dimension 3:  Description of emotional, behavioral, or cognitive conditions and complications: Mental health: trauma and depression  Dimension 4:  Readiness to Change:  Dimension 4:  Description of Readiness to Change criteria: Preparation  Dimension 5:  Relapse, Continued use, or Continued Problem  Potential:  Dimension 5:  Relapse, continued use, or continued problem potential critiera description: Relapse potential  Dimension 6:  Recovery/Living Environment:  Dimension 6:  Recovery/Iiving environment criteria description: Unstable, lives alone but has supports he can contact if needed and positive community supports  ASAM Severity Score: ASAM's Severity Rating Score: 8  ASAM Recommended Level of Treatment: ASAM Recommended Level of Treatment: Level II Intensive Outpatient Treatment   Substance use Disorder (SUD) Substance Use Disorder (SUD)  Checklist Symptoms of Substance Use: Continued use despite having a persistent/recurrent physical/psychological problem caused/exacerbated by use, Continued use despite persistent or recurrent social, interpersonal problems, caused or exacerbated by use, Presence of craving or strong urge to use, Large amounts of  time spent to obtain, use or recover from the substance(s)  Recommendations for Services/Supports/Treatments: Recommendations for Services/Supports/Treatments Recommendations For Services/Supports/Treatments: CD-IOP Intensive Chemical Dependency Program  DSM5 Diagnoses: Patient Active Problem List   Diagnosis Date Noted   Substance induced mood disorder ([place]) 07/18/2022   Atrial fibrillation with RVR ([place]) 01/08/2021   Acute respiratory failure ([place]) 12/24/2020   Obstructive sleep apnea treated with BiPAP 12/24/2020   Polysubstance use disorder 08/20/2020   Alcohol abuse 08/20/2020   Atrial fibrillation with rapid ventricular response ([place]) 08/19/2020   Obesity hypoventilation syndrome ([place]) 06/12/2020   Healthcare maintenance 06/12/2020   Acute on chronic diastolic heart failure ([place]) 02/24/2020   Diabetic neuropathy ([place]) 11/20/2019   Coagulation disorder ([place]) 11/20/2019   Acute on chronic diastolic CHF (congestive heart failure), NYHA class 3 ([place]) 07/30/2019   Diabetes mellitus without complication ([place]) [id number]    Hypertensive emergency    BRBPR (bright red blood per rectum) 06/12/2019   Hypertension    CHF (congestive heart failure) (HCC)    Sleep apnea    Chronic respiratory failure ([place])    Hypokalemia 05/29/2019   GI bleed 05/29/2019   Anemia 05/28/2019   Iron deficiency anemia 03/11/2019   OSA on CPAP 03/11/2019   HLD (hyperlipidemia) 03/11/2019   Elevated troponin 03/11/2019   GERD (gastroesophageal reflux disease) 03/11/2019   Rectal bleeding 02/20/2019   Dyspnea 02/06/2019   COVID-19 virus infection 02/06/2019   Bilateral lower extremity edema    Morbid obesity with BMI of 50.0-59.9, adult (HCC)    PAF (paroxysmal atrial fibrillation) ([place])    PE (pulmonary thromboembolism) ([place]) 01/21/2019   Hypertensive urgency 01/21/2019   Diabetes mellitus type 2 in obese [facility]) 01/21/2019    Patient Centered Plan: Patient is on the following Treatment Plan(s):  Substance Abuse      Collaboration of Care: Other provider involved in patient's care AEB Probation Officer [place], [phone], [phone] ROI signed by client. [name], LCSW ROI signed by client.  Patient/Guardian was advised Release of Information must be obtained prior to any record release in order to collaborate their care with an outside provider. Patient/Guardian was advised if they have not already done so to contact the registration department to sign all necessary forms in order for [place] to release information regarding their care.   Consent: Patient/Guardian gives verbal consent for treatment and assignment of benefits for services provided during this visit. Patient/Guardian expressed understanding and agreed to proceed.   Allyson Sabal, [facility]

## 2022-07-25 NOTE — Telephone Encounter (Signed)
CSW received call from pt providing CSW with updated phone number- number updated in chart.  CSW reminded pt of appt this afternoon at 2pm with [facility] to get established in Intensive Outpatient Rehab program- pt aware and reports no concerns getting to appt.  Will continue to follow and assist as needed  [name], [place] [facility] Desk#: [phone] Cell#: [phone]

## 2022-07-26 ENCOUNTER — Telehealth (HOSPITAL_COMMUNITY): Payer: Self-pay

## 2022-07-26 NOTE — Telephone Encounter (Signed)
Attempted to reach [name] to set up a home paramedicine visit. Patient did not answer. Voicemail left. I will continue to follow up.   [name], [place] 07/26/2022

## 2022-07-29 ENCOUNTER — Ambulatory Visit (INDEPENDENT_AMBULATORY_CARE_PROVIDER_SITE_OTHER): Payer: Medicaid Other | Admitting: Licensed Clinical Social Worker

## 2022-07-29 DIAGNOSIS — F1990 Other psychoactive substance use, unspecified, uncomplicated: Secondary | ICD-10-CM

## 2022-08-01 ENCOUNTER — Ambulatory Visit (INDEPENDENT_AMBULATORY_CARE_PROVIDER_SITE_OTHER): Payer: Medicaid Other | Admitting: Licensed Clinical Social Worker

## 2022-08-01 DIAGNOSIS — F1990 Other psychoactive substance use, unspecified, uncomplicated: Secondary | ICD-10-CM | POA: Diagnosis not present

## 2022-08-01 NOTE — Group Note (Signed)
Group Topic: Substance Abuse Treatment  Group Date: 08/01/2022 Start Time:  9:00 AM End Time: 12:00 PM Facilitators: Cheri Fowler, [facility]  Department: [facility]  Group Topic: Substance Use Treatment- Triggers  Group Date: 08/01/2022 Start Time: 0900 End Time: 1200 Facilitators: [name] C Department: [facility] CD-IOP  @GROUPNOTE @ Name: No patient name on file. Date of Birth: There is no date of birth on file.  MR: No patient ID available    Level of Participation: active Quality of Participation: cooperative and engaged Interactions with others: gave feedback Mood/Affect: appropriate Triggers (if applicable): Client is able to identify triggers and how they worked through them. Cognition: coherent/clear and insightful Progress: Gaining insight Response: Client is alert and oriented x5 during group Plan: patient will be encouraged to attend 12-step meeting and abstain from substance use  Patients Problems:  @PROB @   Family Program: Family present? No   Name of family member(s): 0  UDS collected: No   AA/NA attended?: No  Sponsor?: No  Name: Benjamin [name] Date of Birth: [birth year]  MR: [name]     Patients Problems:  Patient Active Problem List   Diagnosis Date Noted   Substance induced mood disorder (HCC) 07/18/2022   Atrial fibrillation with RVR (HCC) 01/08/2021   Acute respiratory failure (HCC) 12/24/2020   Obstructive sleep apnea treated with BiPAP 12/24/2020   Polysubstance use disorder 08/20/2020   Alcohol abuse 08/20/2020   Atrial fibrillation with rapid ventricular response (HCC) 08/19/2020   Obesity hypoventilation syndrome (HCC) 06/12/2020   Healthcare maintenance 06/12/2020   Acute on chronic diastolic heart failure (HCC) 02/24/2020   Diabetic neuropathy (HCC) 11/20/2019   Coagulation disorder (HCC) 11/20/2019   Acute on chronic diastolic CHF (congestive heart failure), NYHA class 3 (HCC) 07/30/2019    Diabetes mellitus without complication (HCC) 07/16/2019   Hypertensive emergency    BRBPR (bright red blood per rectum) 06/12/2019   Hypertension    CHF (congestive heart failure) (HCC)    Sleep apnea    Chronic respiratory failure (HCC)    Hypokalemia 05/29/2019   GI bleed 05/29/2019   Anemia 05/28/2019   Iron deficiency anemia 03/11/2019   OSA on CPAP 03/11/2019   HLD (hyperlipidemia) 03/11/2019   Elevated troponin 03/11/2019   GERD (gastroesophageal reflux disease) 03/11/2019   Rectal bleeding 02/20/2019   Dyspnea 02/06/2019   COVID-19 virus infection 02/06/2019   Bilateral lower extremity edema    Morbid obesity with BMI of 50.0-59.9, adult (HCC)    PAF (paroxysmal atrial fibrillation) (HCC)    PE (pulmonary thromboembolism) (HCC) 01/21/2019   Hypertensive urgency 01/21/2019   Diabetes mellitus type 2 in obese (HCC) 01/21/2019      UDS collected: No Results: Results pending  AA/NA attended?: No  Sponsor?: No

## 2022-08-01 NOTE — Group Note (Signed)
Group Topic: Relapse Prevention  Group Date: 07/29/2022 Start Time:  9:00 AM End Time: 12:00 PM Facilitators: Cheri Fowler, [facility]  Department: [facility]  Group Topic: Relapse Prevention & Stages of Change Group Date: 07/29/2022 Start Time: 0900 End Time: 1200 Facilitators: Cheri Fowler Department: [facility] CD-IOP  @GROUPNOTE @ Name: No patient name on file. Date of Birth: There is no date of birth on file.  MR: No patient ID available    Level of Participation: active Quality of Participation: cooperative and engaged Interactions with others: gave feedback Mood/Affect: appropriate Triggers (if applicable):  Cognition: coherent/clear and insightful Progress: Gaining insight Response: Client is alert and oriented x5 during group session. Client is active in group discussion and provides feedback to peers. Client processed issues and stressors as they came up in group. Denies SI/HI, AVH or psychosis. Plan: patient will be encouraged to attend group treatment and abstain from substance use.  Patients Problems:  @PROB @   Family Program: Family present? No   Name of family member(s): 0  Name: Benjamin [name] Date of Birth: [birth year]  MR: [name]      Patients Problems:  Patient Active Problem List   Diagnosis Date Noted   Substance induced mood disorder (HCC) 07/18/2022   Atrial fibrillation with RVR (HCC) 01/08/2021   Acute respiratory failure (HCC) 12/24/2020   Obstructive sleep apnea treated with BiPAP 12/24/2020   Polysubstance use disorder 08/20/2020   Alcohol abuse 08/20/2020   Atrial fibrillation with rapid ventricular response (HCC) 08/19/2020   Obesity hypoventilation syndrome (HCC) 06/12/2020   Healthcare maintenance 06/12/2020   Acute on chronic diastolic heart failure (HCC) 02/24/2020   Diabetic neuropathy (HCC) 11/20/2019   Coagulation disorder (HCC) 11/20/2019   Acute on chronic diastolic CHF (congestive heart  failure), NYHA class 3 (HCC) 07/30/2019   Diabetes mellitus without complication (HCC) 07/16/2019   Hypertensive emergency    BRBPR (bright red blood per rectum) 06/12/2019   Hypertension    CHF (congestive heart failure) (HCC)    Sleep apnea    Chronic respiratory failure (HCC)    Hypokalemia 05/29/2019   GI bleed 05/29/2019   Anemia 05/28/2019   Iron deficiency anemia 03/11/2019   OSA on CPAP 03/11/2019   HLD (hyperlipidemia) 03/11/2019   Elevated troponin 03/11/2019   GERD (gastroesophageal reflux disease) 03/11/2019   Rectal bleeding 02/20/2019   Dyspnea 02/06/2019   COVID-19 virus infection 02/06/2019   Bilateral lower extremity edema    Morbid obesity with BMI of 50.0-59.9, adult (HCC)    PAF (paroxysmal atrial fibrillation) (HCC)    PE (pulmonary thromboembolism) (HCC) 01/21/2019   Hypertensive urgency 01/21/2019   Diabetes mellitus type 2 in obese (HCC) 01/21/2019     Family Program: Family present? No   Name of family member(s): 0  UDS collected: No Results: Results pending  AA/NA attended?: No  Sponsor?: No

## 2022-08-02 ENCOUNTER — Ambulatory Visit (HOSPITAL_COMMUNITY): Payer: Medicaid Other | Admitting: Psychiatry

## 2022-08-02 ENCOUNTER — Telehealth (HOSPITAL_COMMUNITY): Payer: Self-pay | Admitting: Licensed Clinical Social Worker

## 2022-08-02 DIAGNOSIS — J9611 Chronic respiratory failure with hypoxia: Secondary | ICD-10-CM | POA: Diagnosis not present

## 2022-08-02 DIAGNOSIS — I5032 Chronic diastolic (congestive) heart failure: Secondary | ICD-10-CM | POA: Diagnosis not present

## 2022-08-02 NOTE — Telephone Encounter (Signed)
CSW attempted to check in with pt regarding progress with IIOP program with [facility] and with obtaining rent for his complex this month.  Unable to reach- left VM for pt requesting return call.  [name], LCSW Clinical Social Worker [facility] Desk#: [phone] Cell#: [phone]

## 2022-08-02 NOTE — Progress Notes (Unsigned)
Psychiatric Initial Adult Assessment  Patient Identification: Benjamin [name] MRN:  [id number] Date of Evaluation:  08/02/2022 Referral Source: ***  Assessment:  Benjamin [name] is a 56 y.o. male with a history of substance induced mood disorder, alcohol use disorder, cocaine use disorder, atrial fibrillation, T2DM, CHF, OSA, COPD on home O2 *** who presents to [facility] via video conferencing for initial evaluation of ***.  Patient reports ***  Plan:  # *** Past medication trials:  Status of problem: *** Interventions: -- *** -- Continue CD-IOP program  # *** Past medication trials:  Status of problem: *** Interventions: -- ***  # *** Past medication trials:  Status of problem: *** Interventions: -- ***  Patient was given contact information for behavioral health clinic and was instructed to call 911 for emergencies.   Subjective:  Chief Complaint: No chief complaint on file.   History of Present Illness:  ***  Seen by Darol Destine, [facility] on 07/25/22 for CCA. Endorsing delusional thought content in s/o etoh, cocaine, and cannabis use.    Substance use CD-IOP AH Mood Grief (mother's death) Sleep, appetite SI   Past Psychiatric History:  Diagnoses: ***substance induced mood disorder, alcohol use disorder, cocaine use disorder Medication trials: *** Previous psychiatrist/therapist: *** Hospitalizations: ***most recently admitted to [facility] 07/18/22-07/19/22 for substance use Suicide attempts: *** SIB: *** Hx of violence towards others: *** Current access to guns: *** Hx of abuse: ***  -- Etoh:  -- Cocaine: 1-1.5 ox daily since 2021; route smoking  -- Cannabis:  Previous Psychotropic Medications: {YES/NO:21197}  Substance Abuse History in the last 12 months:  {yes no:314532}  Past Medical History:  Past Medical History:  Diagnosis Date   A-fib ([place])    Anemia 05/29/2019   CHF (congestive heart failure) (HCC)    Chronic  respiratory failure (HCC)    Diabetes mellitus without complication (HCC)    Dyspnea    Elevated troponin 02/06/2019   Hypertension    Obesity    Pulmonary embolism ([place])    Rectal bleeding 05/29/2019   Sleep apnea     Past Surgical History:  Procedure Laterality Date   CARDIOVERSION N/A 08/21/2020   Procedure: CARDIOVERSION;  Surgeon: Larey Dresser, MD;  Location: [place];  Service: Cardiovascular;  Laterality: N/A;   COLONOSCOPY WITH PROPOFOL Left 02/25/2019   Procedure: COLONOSCOPY WITH PROPOFOL;  Surgeon: Arta Silence, MD;  Location: [place];  Service: Endoscopy;  Laterality: Left;   ESOPHAGOGASTRODUODENOSCOPY (EGD) WITH PROPOFOL Left 02/25/2019   Procedure: ESOPHAGOGASTRODUODENOSCOPY (EGD) WITH PROPOFOL;  Surgeon: Arta Silence, MD;  Location: [facility] ENDOSCOPY;  Service: Endoscopy;  Laterality: Left;   ESOPHAGOGASTRODUODENOSCOPY (EGD) WITH PROPOFOL N/A 06/12/2019   Procedure: ESOPHAGOGASTRODUODENOSCOPY (EGD) WITH PROPOFOL;  Surgeon: Clarene Essex, MD;  Location: [place];  Service: Gastroenterology;  Laterality: N/A;   FLEXIBLE SIGMOIDOSCOPY N/A 06/12/2019   Procedure: FLEXIBLE SIGMOIDOSCOPY;  Surgeon: Clarene Essex, MD;  Location: [place];  Service: Gastroenterology;  Laterality: N/A;   HEMORRHOID BANDING  05/2019   NO PAST SURGERIES     RIGHT HEART CATH N/A 04/17/2019   Procedure: RIGHT HEART CATH;  Surgeon: Larey Dresser, MD;  Location: [place] CV LAB;  Service: Cardiovascular;  Laterality: N/A;   TEE WITHOUT CARDIOVERSION N/A 08/21/2020   Procedure: TRANSESOPHAGEAL ECHOCARDIOGRAM (TEE);  Surgeon: Larey Dresser, MD;  Location: [name] ENDOSCOPY;  Service: Cardiovascular;  Laterality: N/A;    Family Psychiatric History: ***  Family History:  Family History  Problem Relation Age of Onset  Hypertension Mother    Diabetes Mother     Social History:   Social History   Socioeconomic History   Marital status: Single    Spouse name: Not on file   Number of  children: Not on file   Years of education: Not on file   Highest education level: Not on file  Occupational History   Not on file  Tobacco Use   Smoking status: Former   Smokeless tobacco: Never   Tobacco comments:    smoked weed in the past  Vaping Use   Vaping Use: Never used  Substance and Sexual Activity   Alcohol use: Not Currently   Drug use: Yes    Types: Marijuana, Cocaine   Sexual activity: Not Currently  Other Topics Concern   Not on file  Social History Narrative   Not on file   Social Determinants of Health   Financial Resource Strain: High Risk (02/23/2022)   Overall Financial Resource Strain (CARDIA)    Difficulty of Paying Living Expenses: Hard  Food Insecurity: No Food Insecurity (08/03/2021)   Hunger Vital Sign    Worried About Running Out of Food in the Last Year: Never true    Ran Out of Food in the Last Year: Never true  Transportation Needs: Unmet Transportation Needs (06/30/2022)   PRAPARE - Hydrologist (Medical): Yes    Lack of Transportation (Non-Medical): Yes  Physical Activity: Not on file  Stress: Not on file  Social Connections: Not on file    Additional Social History: ***  Allergies:  No Known Allergies  Current Medications: Current Outpatient Medications  Medication Sig Dispense Refill   albuterol (PROVENTIL) (2.5 MG/3ML) 0.083% nebulizer solution INHALE 1 VIAL BY NEBULIZATION EVERY 6 (SIX) HOURS AS NEEDED FOR WHEEZING OR SHORTNESS OF BREATH. (Patient taking differently: Take 2.5 mg by nebulization every 6 (six) hours as needed for wheezing or shortness of breath.) 150 mL 1   atorvastatin (LIPITOR) 40 MG tablet Take 1 tablet (40 mg total) by mouth at bedtime. 90 tablet 3   Blood Glucose Monitoring Suppl (TRUE METRIX METER) w/Device KIT Use as instructed. Check blood glucose level by fingerstick twice per day. 1 kit 0   Blood Pressure Monitor DEVI Please provide patient with insurance approved blood pressure  device with L-XL cuff. BMI 55 1 each 0   diltiazem (CARDIZEM CD) 360 MG 24 hr capsule Take 1 capsule (360 mg total) by mouth daily. 90 capsule 3   empagliflozin (JARDIANCE) 10 MG TABS tablet Take 1 tablet (10 mg total) by mouth daily before breakfast. 30 tablet 6   FEROSUL 325 (65 Fe) MG tablet TAKE 1 TABLET (325 MG TOTAL) BY MOUTH DAILY AT NOON. (Patient taking differently: 325 mg daily.) 90 tablet 0   glucose blood (TRUE METRIX BLOOD GLUCOSE TEST) test strip Use as instructed. Check blood glucose level by fingerstick twice per day. 100 each 12   hydrALAZINE (APRESOLINE) 50 MG tablet Take 1 tablet (50 mg total) by mouth 3 (three) times daily. 180 tablet 3   Ipratropium-Albuterol (COMBIVENT RESPIMAT) 20-100 MCG/ACT AERS respimat Inhale 1 puff into the lungs every 6 (six) hours. 4 g 5   losartan (COZAAR) 50 MG tablet Take 1 tablet (50 mg total) by mouth daily. 90 tablet 3   magnesium oxide (MAG-OX) 400 (240 Mg) MG tablet TAKE 1 TABLET (400 MG TOTAL) BY MOUTH 2 (TWO) TIMES DAILY (NOON+BEDTIME) (Patient taking differently: Take 400 mg by mouth 2 (two) times  daily.) 90 tablet 3   metFORMIN (GLUCOPHAGE) 500 MG tablet Take 1 tablet (500 mg total) by mouth 2 (two) times daily with a meal. 180 tablet 1   omeprazole (PRILOSEC) 20 MG capsule TAKE 1 CAPSULE 2 (TWO) TIMES DAILY BEFORE A MEAL. TAKE FIRST CAPSULE 30 MIN PRIOR TO EATING OR TAKING OTHER MEDICATIONS. (AM+BEDTIME) (Patient taking differently: 20 mg 2 (two) times daily before a meal.) 180 capsule 1   potassium chloride (KLOR-CON) 10 MEQ tablet Take 8 tablets (80 mEq total) by mouth daily. (Patient taking differently: Take 40 mEq by mouth 2 (two) times daily. Take four tablets at noon and four tablets in the evening.) 240 tablet 11   rivaroxaban (XARELTO) 20 MG TABS tablet Take 1 tablet (20 mg total) by mouth daily with supper. 90 tablet 3   spironolactone (ALDACTONE) 50 MG tablet Take 1 tablet (50 mg total) by mouth daily. 30 tablet 11   tamsulosin  (FLOMAX) 0.4 MG CAPS capsule Take 0.4 mg by mouth daily after supper.     torsemide (DEMADEX) 20 MG tablet Take 2 tablets (40 mg total) by mouth 2 (two) times daily. 60 tablet 11   TRUEplus Lancets 28G MISC Use as instructed. Check blood glucose level by fingerstick twice per day. 200 each 3   No current facility-administered medications for this visit.    ROS: Review of Systems  Objective:  Psychiatric Specialty Exam: There were no vitals taken for this visit.There is no height or weight on file to calculate BMI.  General Appearance: {Appearance:22683}  Eye Contact:  {BHH EYE CONTACT:22684}  Speech:  {Speech:22685}  Volume:  {Volume (PAA):22686}  Mood:  {BHH MOOD:22306}  Affect:  {Affect (PAA):22687}  Thought Content: {Thought Content:22690}   Suicidal Thoughts:  {ST/HT (PAA):22692}  Homicidal Thoughts:  {ST/HT (PAA):22692}  Thought Process:  {Thought Process (PAA):22688}  Orientation:  {BHH ORIENTATION (PAA):22689}    Memory:  {BHH MEMORY:22881}  Judgment:  {Judgement (PAA):22694}  Insight:  {Insight (PAA):22695}  Concentration:  {Concentration:21399}  Recall:  {BHH GOOD/FAIR/POOR:22877}  Fund of Knowledge: {BHH GOOD/FAIR/POOR:22877}  Language: {BHH GOOD/FAIR/POOR:22877}  Psychomotor Activity:  {Psychomotor (PAA):22696}  Akathisia:  {BHH YES OR NO:22294}  AIMS (if indicated): {Desc; done/not:10129}  Assets:  {Assets (PAA):22698}  ADL's:  {BHH BTD'V:76160}  Cognition: {chl bhh cognition:304700322}  Sleep:  {BHH GOOD/FAIR/POOR:22877}   PE: General: sits comfortably in view of camera; no acute distress *** Pulm: no increased work of breathing on room air *** MSK: all extremity movements appear intact *** Neuro: no focal neurological deficits observed *** Gait & Station: unable to assess by video ***   Metabolic Disorder Labs: Lab Results  Component Value Date   HGBA1C 6.3 (H) 07/18/2022   MPG 134.11 07/18/2022   MPG 145.59 02/16/2022   Lab Results  Component  Value Date   PROLACTIN 7.0 07/18/2022   Lab Results  Component Value Date   CHOL 101 07/18/2022   TRIG 36 07/18/2022   HDL 45 07/18/2022   CHOLHDL 2.2 07/18/2022   VLDL 7 07/18/2022   LDLCALC 49 07/18/2022   LDLCALC 65 02/16/2022   Lab Results  Component Value Date   TSH 1.648 07/18/2022    Therapeutic Level Labs: No results found for: "LITHIUM" No results found for: "CBMZ" No results found for: "VALPROATE"  Screenings:  GAD-7    Flowsheet Row Office Visit from 03/30/2022 in [place] Office Visit from 12/21/2020 in [name] Office Visit from 02/18/2019 in [facility]  Total GAD-7 Score 15 0 12      PHQ2-9    Flowsheet Row ED from 07/18/2022 in [facility] Office Visit from 03/30/2022 in [place] Office Visit from 12/21/2020 in [place] Office Visit from 04/30/2019 in [place] Office Visit from 02/18/2019 in [place]  PHQ-2 Total Score 4 2 0 5 2  PHQ-9 Total Score 16 8 0 20 7      [place] ED from 07/18/2022 in [facility] ED from 02/16/2022 in [place] ED from 07/20/2021 in [facility] [place] No Risk No Risk No Risk       Collaboration of Care: Collaboration of Care: [facility] OP Collaboration of CNOB:[id number]}  Patient/Guardian was advised Release of Information must be obtained prior to any record release in order to collaborate their care with an outside provider. Patient/Guardian was advised if they have not already done so to contact the registration department to sign all necessary forms in order for [place] to release information regarding their care.   Consent: Patient/Guardian gives verbal consent for treatment and  assignment of benefits for services provided during this visit. Patient/Guardian expressed understanding and agreed to proceed.   Televisit via video: I connected with Benjamin [name] on 08/02/22 at 10:30 AM EST by a video enabled telemedicine application and verified that I am speaking with the correct person using two identifiers.  Location: Patient: *** Provider: remote office in [place]   I discussed the limitations of evaluation and management by telemedicine and the availability of in person appointments. The patient expressed understanding and agreed to proceed.  I discussed the assessment and treatment plan with the patient. The patient was provided an opportunity to ask questions and all were answered. The patient agreed with the plan and demonstrated an understanding of the instructions.   The patient was advised to call back or seek an in-person evaluation if the symptoms worsen or if the condition fails to improve as anticipated.  I provided *** minutes of non-face-to-face time during this encounter.  Haskell A  11/14/20239:13 PM

## 2022-08-03 ENCOUNTER — Ambulatory Visit (HOSPITAL_COMMUNITY): Payer: Medicaid Other

## 2022-08-03 ENCOUNTER — Encounter (HOSPITAL_COMMUNITY): Payer: Medicaid Other | Admitting: Psychiatry

## 2022-08-03 ENCOUNTER — Encounter (HOSPITAL_COMMUNITY): Payer: Self-pay

## 2022-08-03 ENCOUNTER — Telehealth (HOSPITAL_COMMUNITY): Payer: Self-pay

## 2022-08-03 NOTE — Telephone Encounter (Signed)
Attempted to reach Mr. Winner to set up home paramedicine visit- no answer. Left VM and will continue to reach out.    Maralyn Sago, EMT-Paramedic [phone] 08/03/2022

## 2022-08-05 ENCOUNTER — Ambulatory Visit (HOSPITAL_COMMUNITY): Payer: Medicaid Other

## 2022-08-05 ENCOUNTER — Ambulatory Visit (INDEPENDENT_AMBULATORY_CARE_PROVIDER_SITE_OTHER): Payer: Medicaid Other | Admitting: Licensed Clinical Social Worker

## 2022-08-05 DIAGNOSIS — F1410 Cocaine abuse, uncomplicated: Secondary | ICD-10-CM | POA: Diagnosis not present

## 2022-08-08 ENCOUNTER — Ambulatory Visit (INDEPENDENT_AMBULATORY_CARE_PROVIDER_SITE_OTHER): Payer: Medicaid Other | Admitting: Podiatry

## 2022-08-08 ENCOUNTER — Ambulatory Visit (INDEPENDENT_AMBULATORY_CARE_PROVIDER_SITE_OTHER): Payer: Medicaid Other | Admitting: Licensed Clinical Social Worker

## 2022-08-08 DIAGNOSIS — M79675 Pain in left toe(s): Secondary | ICD-10-CM | POA: Diagnosis not present

## 2022-08-08 DIAGNOSIS — M79674 Pain in right toe(s): Secondary | ICD-10-CM

## 2022-08-08 DIAGNOSIS — B351 Tinea unguium: Secondary | ICD-10-CM

## 2022-08-08 DIAGNOSIS — E1149 Type 2 diabetes mellitus with other diabetic neurological complication: Secondary | ICD-10-CM

## 2022-08-08 DIAGNOSIS — F1410 Cocaine abuse, uncomplicated: Secondary | ICD-10-CM

## 2022-08-08 NOTE — Telephone Encounter (Signed)
CSW heard back from pt who confirms that he has been going to classes and is feeling better at this time.  CSW received update from [employer] regarding repossessed trilogy vent- they will require pt to go to new appt with pulmonologist and have new order sent in so they can return machine to pt for use.  CSW helped to call and make pt appt with his Pulmonologist office and informed them of need for new order to be put in once seen.  Will continue to follow and assist as needed  [name], LCSW Clinical Social Worker [facility] Desk#: [phone] Cell#: [phone]

## 2022-08-08 NOTE — Progress Notes (Signed)
Subjective: Chief Complaint  Patient presents with   Diabetes    Diabetic foot care, A1c- 6.3 BG-98(yesterday) Nail trim     56 year old male presents the above concerns.  States his nails are thickened elongated causing discomfort.  No swelling or redness to the toenail sites.  No open lesions  Objective: AAO x3, NAD DP/PT pulses palpable bilaterally, CRT less than 3 seconds Nails are hypertrophic, dystrophic, brittle, discolored, elongated 10. No surrounding redness or drainage. Tenderness nails 1-5 bilaterally. No open lesions or pre-ulcerative lesions are identified today. Dry skin present bilaterally. No open sores or skin fissures. No pain with calf compression, swelling, warmth, erythema  Assessment: Symptomatic onychomycosis  Plan: -All treatment options discussed with the patient including all alternatives, risks, complications.  -Sharp debrided nails x 10 without any complications or bleeding. -Discussed moisturizer for his feet. -He has his brace but not wearing it.  Previously given prescription for diabetic shoes and this was given to him again today. -Patient encouraged to call the office with any questions, concerns, change in symptoms.

## 2022-08-08 NOTE — Patient Instructions (Signed)
Choose a moisturizer from  the list below:  For normal skin: Moisturize feet once daily; do not apply between toes A.  CeraVe Daily Moisturizing Lotion B.  Lubriderm Advanced Therapy Lotion or Lubriderm Intense Skin Repair Lotion C.  Vaseline Intensive Care Lotion D.  Gold Bond Ultimate Diabetic Foot Lotion E.  Eucerin Intensive Repair Moisturizing Lotion  For extremely dry, cracked feet: moisturize feet once daily; do not apply between toes A. CeraVe Healing Ointment B. Eucerin Aquaphor Repairing Ointment (may be labeled Aquaphor Healing Ointment) C. Vaseline Petroleum Healing Jelly   If you have problems reaching your feet: apply to feet once daily; do not apply between toes A.  Eucerin Aquaphor Ointment Body Spray  B.  Vaseline Intensive Care Spray Moisturizer (Unscented,  Cocoa Radiant Spray or Aloe Smooth Spray)    
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
## 2022-08-09 ENCOUNTER — Ambulatory Visit (HOSPITAL_COMMUNITY): Payer: Medicaid Other | Admitting: Psychiatry
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
## 2022-08-10 ENCOUNTER — Telehealth (HOSPITAL_COMMUNITY): Payer: Self-pay

## 2022-08-10 ENCOUNTER — Ambulatory Visit (HOSPITAL_COMMUNITY): Payer: Medicaid Other

## 2022-08-10 NOTE — Telephone Encounter (Signed)
Mr. Pannone reached out to me canceling our paramedicine visit scheduled for today at 1400- he reports he is not feeling well and wishes to be seen next week. I will follow up on Monday 11/27. Call complete.   Maralyn Sago, EMT-Paramedic [phone] 08/10/2022

## 2022-08-12 ENCOUNTER — Ambulatory Visit (HOSPITAL_COMMUNITY): Payer: Medicaid Other

## 2022-08-15 ENCOUNTER — Other Ambulatory Visit (HOSPITAL_COMMUNITY): Payer: Self-pay | Admitting: Family Medicine

## 2022-08-15 ENCOUNTER — Telehealth (HOSPITAL_COMMUNITY): Payer: Self-pay

## 2022-08-15 ENCOUNTER — Other Ambulatory Visit (HOSPITAL_COMMUNITY): Payer: Self-pay

## 2022-08-15 ENCOUNTER — Telehealth (HOSPITAL_COMMUNITY): Payer: Self-pay | Admitting: Licensed Clinical Social Worker

## 2022-08-15 ENCOUNTER — Ambulatory Visit (HOSPITAL_COMMUNITY): Payer: Medicaid Other

## 2022-08-15 ENCOUNTER — Other Ambulatory Visit: Payer: Self-pay | Admitting: Nurse Practitioner

## 2022-08-15 NOTE — Telephone Encounter (Signed)
08/15/2022  Benjamin [name] DOB: [birth year] MRN: [id number]   RIDER WAIVER AND RELEASE OF LIABILITY  For the purposes of helping with transportation needs, [facility] partners with outside transportation providers (taxi companies, [place], Catering manager.) to give [employer] patients or other approved people the choice of on-demand rides [name]") to our buildings for non-emergency visits.  By using [employer], I, the person signing this document, on behalf of myself and/or any legal minors (in my care using the [employer]), agree:  Science writer given to me are supplied by independent, outside transportation providers who do not work for, or have any affiliation with, [employer]. [place] is not a transportation company. [place] has no control over the quality or safety of the rides I get using [employer]. [facility] has no control over whether any outside ride will happen on time or not. [facility] gives no guarantee on the reliability, quality, safety, or availability on any rides, or that no mistakes will happen. I know and accept that traveling by vehicle (car, truck, SVU, Zenaida Niece, bus, taxi, etc.) has risks of serious injuries such as disability, being paralyzed, and death. I know and agree the risk of using [employer] is mine alone, and not [employer]. Transport Services are provided "as is" and as are available. The transportation providers are in charge for all inspections and care of the vehicles used to provide these rides. I agree not to take legal action against [facility], its agents, employees, officers, directors, representatives, insurers, attorneys, assigns, successors, subsidiaries, and affiliates at any time for any reasons related directly or indirectly to using [employer]. I also agree not to take legal action against [facility] or its affiliates for any injury, death, or damage to property caused by or related to  using [employer]. I have read this Waiver and Release of Liability, and I understand the terms used in it and their legal meaning. This Waiver is freely and voluntarily given with the understanding that my right (or any legal minors) to legal action against  relating to [employer] is knowingly given up to use these services.   I attest that I read the Ride Waiver and Release of Liability to Benjamin [name], gave [name] the opportunity to ask questions and answered the questions asked (if any). I affirm that Benjamin [name] then provided consent for assistance with transportation.     [name]

## 2022-08-15 NOTE — Telephone Encounter (Signed)
H&V Care Navigation CSW Progress Note  Clinical Social Worker contacted patient by phone to discuss transport concerns.  Pt has a car but tire has deflated- no way to get to app tomorrow.  CSW arranged ride through [facility].  Patient is participating in a Managed Medicaid Plan:  Yes  SDOH Screenings   Food Insecurity: No Food Insecurity (08/03/2021)  Housing: High Risk (02/23/2022)  Transportation Needs: Unmet Transportation Needs (08/15/2022)  Utilities: At Risk (07/14/2022)  Depression (PHQ2-9): High Risk (07/19/2022)  Financial Resource Strain: High Risk (02/23/2022)  Tobacco Use: Medium Risk (07/18/2022)    [name], LCSW Clinical Social Worker [facility] Desk#: [phone] Cell#: [phone]

## 2022-08-15 NOTE — Telephone Encounter (Signed)
Called [name] to check on status of Benjamin [name] bubble packs as Benjamin [name] reports he is running out of his medications.   [name] reports they are needing refills on his metformin before they can finish his full month of bubble packs. I will forward this request to PCP/Pharmacist at [facility] for them to send Metformin refills to [name].   I am picking up one weeks worth of bubble packs for Benjamin [name] now as [name] filled one week including metformin until refills can be fulfilled.   Call routed to [facility] at [facility]   Maralyn Sago, EMT-Paramedic [phone] 08/15/2022

## 2022-08-15 NOTE — Telephone Encounter (Signed)
Rx sent for 30-day supply. He has had two no-shows/cancellations since his last visit in July. He needs an appt scheduled for more refills.

## 2022-08-15 NOTE — Progress Notes (Unsigned)
Benjamin [name] contact Clinician on 08/10/22 to inform that he would not be in group today due to being sick. He will be excused today.  [name], [facility]              [facility]

## 2022-08-15 NOTE — Progress Notes (Signed)
Paramedicine Encounter    Patient ID: Benjamin [name], male    DOB: Apr 23, 1966, 56 y.o.   MRN: [id number]   Arrived for home visit for Benjamin [name] who is seated on his couch in the living room of his apartment alert and oriented. He reports doing well today- he denies shortness of breath, dizziness, chest pain, swelling, weight gain. He weighed on his scale on a flat surface with me watching and his weight recorded today 323lbs. He had minimal lower leg edema noted. He has missed a few days of his medications due to delivery issues with [name]. I was able to pick up one week of his bubble packs today from [employer]. Benjamin [name] Pharmacist reports the remainder will be filled once they receive refills for his hydralazine and metformin. Refills were sent in today after requested by both [facility] and [facility] clinics.   I obtained vitals as noted: WT- 323lbs BP- 180/100  HR- 90 O2- 96% RR- 18 CBG- 85  Lungs- clear   He reports his BIPAP machine was collected by the company due to lack of usage. He reports he uses nasal cannula at night 4lpm. He feels he needs his BIPAP machine back as it was the best for him and he hopes to get this back.  He follows up with Benjamin [name] tomorrow and Benjamin [name] on Thursday.   Benjamin [name] assisted with a ride today as his tire is flat on his car and he doesn't get pain until Thursday.   He also has no food at home other than some small packs of cereal, frozen sausage, water bottles, 6 slices of bread and some crackers. I asked Benjamin [name] if we could assist with a food bag for him while he is in clinic tomorrow as he reports his food stamps don't come through until Thursday also.   Benjamin [name] reports he has not been using cocaine in several weeks now. He reports he is being seen at [facility] and he feels it is helping him stay clean and focused. I encouraged him for his efforts and hopes he will continue to do well.   Appointments wrote down and confirmed. I will plan to follow up in  one month pending Dr. Oleh Genin visit tomorrow.   He did state he was interested in going back to [employer] at the [facility]- I will follow up on same.   Home visit complete.   Benjamin [name], Benjamin [name] 08/15/2022    Patient Care Team: [name], NP as PCP - General (Nurse Practitioner) [name], MD as PCP - Cardiology (Cardiology) [name], LCSW as Social Worker (Licensed Clinical Social Worker)  Patient Active Problem List   Diagnosis Date Noted   Substance induced mood disorder ([place]) 07/18/2022   Atrial fibrillation with RVR ([place]) 01/08/2021   Acute respiratory failure ([place]) 12/24/2020   Obstructive sleep apnea treated with BiPAP 12/24/2020   Polysubstance use disorder 08/20/2020   Alcohol abuse 08/20/2020   Atrial fibrillation with rapid ventricular response ([place]) 08/19/2020   Obesity hypoventilation syndrome ([place]) 06/12/2020   Healthcare maintenance 06/12/2020   Acute on chronic diastolic heart failure ([place]) 02/24/2020   Diabetic neuropathy ([place]) 11/20/2019   Coagulation disorder ([place]) 11/20/2019   Acute on chronic diastolic CHF (congestive heart failure), NYHA class 3 ([place]) 07/30/2019   Diabetes mellitus without complication ([place]) 91/69/4503   Hypertensive emergency    BRBPR (bright red blood per rectum) 06/12/2019   Hypertension    CHF (congestive heart failure) ([place])  Sleep apnea    Chronic respiratory failure (HCC)    Hypokalemia 05/29/2019   GI bleed 05/29/2019   Anemia 05/28/2019   Iron deficiency anemia 03/11/2019   OSA on CPAP 03/11/2019   HLD (hyperlipidemia) 03/11/2019   Elevated troponin 03/11/2019   GERD (gastroesophageal reflux disease) 03/11/2019   Rectal bleeding 02/20/2019   Dyspnea 02/06/2019   COVID-19 virus infection 02/06/2019   Bilateral lower extremity edema    Morbid obesity with BMI of 50.0-59.9, adult (HCC)    PAF (paroxysmal atrial fibrillation) ([place])    PE (pulmonary thromboembolism) ([place]) 01/21/2019    Hypertensive urgency 01/21/2019   Diabetes mellitus type 2 in obese ([place]) 01/21/2019    Current Outpatient Medications:    albuterol (PROVENTIL) (2.5 MG/3ML) 0.083% nebulizer solution, INHALE 1 VIAL BY NEBULIZATION EVERY 6 (SIX) HOURS AS NEEDED FOR WHEEZING OR SHORTNESS OF BREATH. (Patient taking differently: Take 2.5 mg by nebulization every 6 (six) hours as needed for wheezing or shortness of breath.), Disp: 150 mL, Rfl: 1   atorvastatin (LIPITOR) 40 MG tablet, Take 1 tablet (40 mg total) by mouth at bedtime., Disp: 90 tablet, Rfl: 3   Blood Glucose Monitoring Suppl (TRUE METRIX METER) w/Device KIT, Use as instructed. Check blood glucose level by fingerstick twice per day., Disp: 1 kit, Rfl: 0   Blood Pressure Monitor DEVI, Please provide patient with insurance approved blood pressure device with L-XL cuff. BMI 55, Disp: 1 each, Rfl: 0   diltiazem (CARDIZEM CD) 360 MG 24 hr capsule, Take 1 capsule (360 mg total) by mouth daily., Disp: 90 capsule, Rfl: 3   empagliflozin (JARDIANCE) 10 MG TABS tablet, Take 1 tablet (10 mg total) by mouth daily before breakfast., Disp: 30 tablet, Rfl: 6   FEROSUL 325 (65 Fe) MG tablet, TAKE 1 TABLET (325 MG TOTAL) BY MOUTH DAILY AT NOON. (Patient taking differently: 325 mg daily.), Disp: 90 tablet, Rfl: 0   glucose blood (TRUE METRIX BLOOD GLUCOSE TEST) test strip, Use as instructed. Check blood glucose level by fingerstick twice per day., Disp: 100 each, Rfl: 12   hydrALAZINE (APRESOLINE) 50 MG tablet, TAKE 1 TABLET (50 MG TOTAL) BY MOUTH 3 (THREE) TIMES DAILY. (NOON,EVENING,BEDTIME), Disp: 180 tablet, Rfl: 3   Ipratropium-Albuterol (COMBIVENT RESPIMAT) 20-100 MCG/ACT AERS respimat, Inhale 1 puff into the lungs every 6 (six) hours., Disp: 4 g, Rfl: 5   losartan (COZAAR) 50 MG tablet, Take 1 tablet (50 mg total) by mouth daily., Disp: 90 tablet, Rfl: 3   magnesium oxide (MAG-OX) 400 (240 Mg) MG tablet, TAKE 1 TABLET (400 MG TOTAL) BY MOUTH 2 (TWO) TIMES DAILY  (NOON+BEDTIME) (Patient taking differently: Take 400 mg by mouth 2 (two) times daily.), Disp: 90 tablet, Rfl: 3   metFORMIN (GLUCOPHAGE) 500 MG tablet, TAKE 1 TABLET BY MOUTH 2 (TWO) TIMES DAILY WITH A MEAL (NOON+BEDTIME), Disp: 60 tablet, Rfl: 0   omeprazole (PRILOSEC) 20 MG capsule, TAKE 1 CAPSULE 2 (TWO) TIMES DAILY BEFORE A MEAL. TAKE FIRST CAPSULE 30 MIN PRIOR TO EATING OR TAKING OTHER MEDICATIONS. (AM+BEDTIME) (Patient taking differently: 20 mg 2 (two) times daily before a meal.), Disp: 180 capsule, Rfl: 1   potassium chloride (KLOR-CON) 10 MEQ tablet, Take 8 tablets (80 mEq total) by mouth daily. (Patient taking differently: Take 40 mEq by mouth 2 (two) times daily. Take four tablets at noon and four tablets in the evening.), Disp: 240 tablet, Rfl: 11   rivaroxaban (XARELTO) 20 MG TABS tablet, Take 1 tablet (20 mg total) by mouth daily  with supper., Disp: 90 tablet, Rfl: 3   spironolactone (ALDACTONE) 50 MG tablet, Take 1 tablet (50 mg total) by mouth daily., Disp: 30 tablet, Rfl: 11   tamsulosin (FLOMAX) 0.4 MG CAPS capsule, Take 0.4 mg by mouth daily after supper., Disp: , Rfl:    torsemide (DEMADEX) 20 MG tablet, Take 2 tablets (40 mg total) by mouth 2 (two) times daily., Disp: 60 tablet, Rfl: 11   TRUEplus Lancets 28G MISC, Use as instructed. Check blood glucose level by fingerstick twice per day., Disp: 200 each, Rfl: 3 No Known Allergies   Social History   Socioeconomic History   Marital status: Single    Spouse name: Not on file   Number of children: Not on file   Years of education: Not on file   Highest education level: Not on file  Occupational History   Not on file  Tobacco Use   Smoking status: Former   Smokeless tobacco: Never   Tobacco comments:    smoked weed in the past  Vaping Use   Vaping Use: Never used  Substance and Sexual Activity   Alcohol use: Not Currently   Drug use: Yes    Types: Marijuana, Cocaine   Sexual activity: Not Currently  Other Topics  Concern   Not on file  Social History Narrative   Not on file   Social Determinants of Health   Financial Resource Strain: High Risk (02/23/2022)   Overall Financial Resource Strain (CARDIA)    Difficulty of Paying Living Expenses: Hard  Food Insecurity: No Food Insecurity (08/03/2021)   Hunger Vital Sign    Worried About Running Out of Food in the Last Year: Never true    Ran Out of Food in the Last Year: Never true  Transportation Needs: Unmet Transportation Needs (06/30/2022)   PRAPARE - Hydrologist (Medical): Yes    Lack of Transportation (Non-Medical): Yes  Physical Activity: Not on file  Stress: Not on file  Social Connections: Not on file  Intimate Partner Violence: Not on file    Physical Exam      Future Appointments  Date Time Provider [place]  08/16/2022 11:40 AM Larey Dresser, MD [facility] None  08/17/2022  9:00 AM [facility] None  08/18/2022 11:30 AM Clayton Bibles, NP [facility] None  08/19/2022  9:00 AM [facility] None  08/22/2022  9:00 AM [facility] None  08/24/2022  9:00 AM [facility] None  08/26/2022  9:00 AM [facility] None  08/29/2022  9:00 AM [facility] None  08/31/2022  9:00 AM [facility] None  09/02/2022  9:00 AM [facility] None  09/05/2022  9:00 AM [facility] None  09/07/2022  9:00 AM [facility] None  09/09/2022  9:00 AM [facility] None  09/14/2022  9:00 AM [facility] None  09/16/2022  9:00 AM [facility] None  11/08/2022 11:15 AM Trula Slade, DPM [facility]     ACTION: Home visit completed

## 2022-08-16 ENCOUNTER — Encounter (HOSPITAL_COMMUNITY): Payer: Self-pay | Admitting: Cardiology

## 2022-08-16 ENCOUNTER — Ambulatory Visit (HOSPITAL_COMMUNITY)
Admission: RE | Admit: 2022-08-16 | Discharge: 2022-08-16 | Disposition: A | Discharge status: Home / Self Care | Payer: Medicaid Other | Source: Ambulatory Visit | Attending: Cardiology | Admitting: Cardiology

## 2022-08-16 VITALS — BP 108/68 | HR 99 | Wt 327.8 lb

## 2022-08-16 DIAGNOSIS — G4733 Obstructive sleep apnea (adult) (pediatric): Secondary | ICD-10-CM | POA: Insufficient documentation

## 2022-08-16 DIAGNOSIS — J441 Chronic obstructive pulmonary disease with (acute) exacerbation: Secondary | ICD-10-CM | POA: Insufficient documentation

## 2022-08-16 DIAGNOSIS — R55 Syncope and collapse: Secondary | ICD-10-CM | POA: Insufficient documentation

## 2022-08-16 DIAGNOSIS — Z6841 Body Mass Index (BMI) 40.0 and over, adult: Secondary | ICD-10-CM | POA: Diagnosis not present

## 2022-08-16 DIAGNOSIS — Z79899 Other long term (current) drug therapy: Secondary | ICD-10-CM | POA: Insufficient documentation

## 2022-08-16 DIAGNOSIS — E1122 Type 2 diabetes mellitus with diabetic chronic kidney disease: Secondary | ICD-10-CM | POA: Insufficient documentation

## 2022-08-16 DIAGNOSIS — I130 Hypertensive heart and chronic kidney disease with heart failure and stage 1 through stage 4 chronic kidney disease, or unspecified chronic kidney disease: Secondary | ICD-10-CM | POA: Diagnosis not present

## 2022-08-16 DIAGNOSIS — I5032 Chronic diastolic (congestive) heart failure: Secondary | ICD-10-CM

## 2022-08-16 DIAGNOSIS — Z7901 Long term (current) use of anticoagulants: Secondary | ICD-10-CM | POA: Diagnosis not present

## 2022-08-16 DIAGNOSIS — K219 Gastro-esophageal reflux disease without esophagitis: Secondary | ICD-10-CM | POA: Diagnosis not present

## 2022-08-16 DIAGNOSIS — I480 Paroxysmal atrial fibrillation: Secondary | ICD-10-CM | POA: Diagnosis not present

## 2022-08-16 DIAGNOSIS — Z7984 Long term (current) use of oral hypoglycemic drugs: Secondary | ICD-10-CM | POA: Diagnosis not present

## 2022-08-16 DIAGNOSIS — Z86711 Personal history of pulmonary embolism: Secondary | ICD-10-CM | POA: Diagnosis not present

## 2022-08-16 LAB — BASIC METABOLIC PANEL
Anion gap: 10 (ref 5–15)
BUN: 23 mg/dL — ABNORMAL HIGH (ref 6–20)
CO2: 28 mmol/L (ref 22–32)
Calcium: 9.5 mg/dL (ref 8.9–10.3)
Chloride: 100 mmol/L (ref 98–111)
Creatinine, Ser: 1.59 mg/dL — ABNORMAL HIGH (ref 0.61–1.24)
GFR, Estimated: 51 mL/min — ABNORMAL LOW (ref >60)
Glucose, Bld: 109 mg/dL — ABNORMAL HIGH (ref 70–99)
Potassium: 3.7 mmol/L (ref 3.5–5.1)
Sodium: 138 mmol/L (ref 135–145)

## 2022-08-16 LAB — BRAIN NATRIURETIC PEPTIDE: B Natriuretic Peptide: 12.3 pg/mL (ref 0.0–100.0)

## 2022-08-16 MED ORDER — TORSEMIDE 20 MG PO TABS: ORAL_TABLET | ORAL | 5 refills | Status: DC

## 2022-08-16 NOTE — Progress Notes (Signed)
Advanced Heart Failure Clinic Note   PCP: [name], NP Primary Cardiologist: Dorris Carnes, MD  HF Cardiologist: Dr. Aundra Dubin   HPI: Benjamin [name] is a 56 y.o. with hx of diastolic CHF, HTN, DM, Atrial fib, PE in April 2020 (on Xarelto), OSA, GERD, chronic elevation of troponin.    Admitted 01/21/2019 with increased dyspnea and had PE. He was anticoagulated.    Patient re-admitted 5/20 for syncope/cough/dyspnea, COVID+, CT positive for bilateral GGOs, admitted to [facility] and discharged after 6 days, on O2.  Rec'd Cards f/u.   Patient re-admitted 02/20/19 for bright red blood per rectum, eval'ed by GI, had EGD/colon, dx'd with hemorrhoidal bleeding, discharged after 9 days on O2. He was again Covid + .   Readmitted 03/11/19 with increased shortness of breath. He did not have home oxygen. He was discharged to home the next day.    He presented to [facility] ED 04/11/19 with lower extremity edema. COVID negative. Diuresed with lasix drip and transitioned to torsemide 40 mg twice daily. Had [place] with mild volume overload and preserved cardiac output. Discharge weight was 407.9 pounds.    He was readmitted with symptomatic anemia 05/28/19 with hgb 7.1. Received 1UPRBCs. GI consulted. He started on anusol and continued on stool softner.    Readmitted 66/06/30 with a/c diastolic CHF and treated w/ IV Lasix. Had 2 day hospital stay and was discharged on 10/29. Echo showed normal LVEF 65-70%, RV interpreted as normal but suspect significant RV dysfunction.  After diuresis w/ IV Lasix, he was placed back on home diuretic regimen, torsemide 100 mg bid. Lisinopril 5 mg was also added to regimen. AHF team was not consulted that admit.   He was seen back in clinic 07/30/19. Felt poorly. SOB at rest and worse w/ exertion. Also w/ orthopnea/PND. Significant wt gain of 20 lb up to 428 lb (dry wt ~407 lb), w/ marked abdominal distention and LEE and poor response to IV diuretics.  Once diuresed transitioned to torsemide 100  mg twice a day. D/C 408 pounds.   Zio patch on 10/20/19-No arrhythmia.   Re-admitted 08/19/20-08/24/20 for  A fib RVR. A. fib RVR in the setting of noncompliance ethanol and cocaine. Cardizem started and labetalol continued.  12/3 TEE (EF 60-65%) with DCCV successfully converted to SR. Diuresed with IV lasix and had AKI in setting of over diuresis. SCr 1.59 on admit, bumped to 2.5. Diuretics held + IVF hydration.   Admitted 12/24/20 with hypertensive emergency and respiratory failure. .   Admitted 4/22 with A Fib RVR & AMS. Diltiazem increased to 360 mg daily. Chemically converted to NSR.  + Cocaine.   Seen in ED 11/22 for COPD exacerbation.  Seen 11/02/21 and had been off all meds x 1 week, volume overloaded. Meds restarted and instructed to take metolazone 2.5 x 2 days w/ extra 40 kcl, however labs showed elevated BUN/SCr and left message to take metolazone x 1 day.  Re-enrolled in paramedicine.  Echo 5/23 EF 60-65%, moderate LVH, mid-cavity LV gradient to 29 mmHg, RV normal.   Today he returns for HF follow up. Weight is down 18 lbs.  He is using home oxygen and waiting to get his CPAP back. UDS was positive for cocaine in 10/23.  He says he has quit again. He is seeing a substance abuse counselor.  He is short of breath after walking 100 yards.  Breathing is "up and down," seems worse recently. Does ok walking around the house.  No chest pain.  ECG (personally reviewed): NSR, inferolateral TWIs  Labs (11/20): BNP 32, K 3.6, creatinine 1.09  Labs (12/20): K 3.3, creatinine 1.28 Labs (1/21): K 3.9 Creatinine 1.4  Labs (11/05/19) : K 3.2 Creatinine 1.47  Labs (4/21) : K 3.5 Creatinine 1.14  Labs (6/21): K 3.8 Creatinine 1.09.  Labs (12/21): K 4.0, Creatinine 1.17, Magnesium 1.6 Labs (12/21): K 3.1, Creatinine 2.54 Labs (1/22): K 3.1, creatinine 1.56 Labs (5/22): K 3.6, creatinine 1.89 Labs (1/23): K 3.9, creatinine 1.34 Labs (3/23): K 4.0, creatinine 1.29 => 1.77 Labs (5/23); K 4.3,  creatinine 1.47, LDL 65, HDL 44, normal LFTs Labs (10/23): K 3.6, creatinine 1.5, LDL 49  Review of Systems: All systems reviewed and negative except as per HPI.   PMH: 1. Atrial fibrillation: Paroxysmal.  - DCCV (08/21/20): converted to SR 2. Pulmonary embolus: 5/20.  3. OHS/OSA: He is on home oxygen during the day and uses CPAP at night.  4. Morbid obesity.  5. Chronic diastolic CHF:  - RHC (0/16): mean RA 12, PA 40/25 mean 31, mean PCWP 23, CI 2.47, PVR 1.1 WU.  - Echo (10/20): EF 65-70%, mild LVH, normal RV size and systolic function.  - TEE (12/21): EF 60-65% - Cardiolite in 12/21: fixed apical defect is likely artifact, no ischemia.  - Echo (5/23): EF 60-65%, moderate LVH, mid-cavity LV gradient to 29 mmHg, RV normal.  6. Type 2 diabetes 7. HTN 8. Rectal bleeding: ?Hemorrhoidal.  9. COVID-19 infection 7/20.  10. ZIo Patch - 10/20/19 no arrhythmias 11. CKD: Stage 3 12. Depression 13. Cocaine abuse  Current Outpatient Medications  Medication Sig Dispense Refill   albuterol (PROVENTIL) (2.5 MG/3ML) 0.083% nebulizer solution INHALE 1 VIAL BY NEBULIZATION EVERY 6 (SIX) HOURS AS NEEDED FOR WHEEZING OR SHORTNESS OF BREATH. 150 mL 1   atorvastatin (LIPITOR) 40 MG tablet Take 1 tablet (40 mg total) by mouth at bedtime. 90 tablet 3   Blood Glucose Monitoring Suppl (TRUE METRIX METER) w/Device KIT Use as instructed. Check blood glucose level by fingerstick twice per day. 1 kit 0   Blood Pressure Monitor DEVI Please provide patient with insurance approved blood pressure device with L-XL cuff. BMI 55 1 each 0   diltiazem (CARDIZEM CD) 360 MG 24 hr capsule Take 1 capsule (360 mg total) by mouth daily. 90 capsule 3   empagliflozin (JARDIANCE) 10 MG TABS tablet Take 1 tablet (10 mg total) by mouth daily before breakfast. 30 tablet 6   FEROSUL 325 (65 Fe) MG tablet TAKE 1 TABLET (325 MG TOTAL) BY MOUTH DAILY AT NOON. 90 tablet 0   glucose blood (TRUE METRIX BLOOD GLUCOSE TEST) test strip Use as  instructed. Check blood glucose level by fingerstick twice per day. 100 each 12   hydrALAZINE (APRESOLINE) 50 MG tablet TAKE 1 TABLET (50 MG TOTAL) BY MOUTH 3 (THREE) TIMES DAILY. (NOON,EVENING,BEDTIME) 180 tablet 3   Ipratropium-Albuterol (COMBIVENT RESPIMAT) 20-100 MCG/ACT AERS respimat Inhale 1 puff into the lungs every 6 (six) hours. 4 g 5   losartan (COZAAR) 50 MG tablet Take 1 tablet (50 mg total) by mouth daily. 90 tablet 3   magnesium oxide (MAG-OX) 400 (240 Mg) MG tablet TAKE 1 TABLET (400 MG TOTAL) BY MOUTH 2 (TWO) TIMES DAILY (NOON+BEDTIME) 90 tablet 3   metFORMIN (GLUCOPHAGE) 500 MG tablet TAKE 1 TABLET BY MOUTH 2 (TWO) TIMES DAILY WITH A MEAL (NOON+BEDTIME) 60 tablet 0   omeprazole (PRILOSEC) 20 MG capsule TAKE 1 CAPSULE 2 (TWO) TIMES DAILY BEFORE A MEAL. TAKE  FIRST CAPSULE 30 MIN PRIOR TO EATING OR TAKING OTHER MEDICATIONS. (AM+BEDTIME) 180 capsule 1   potassium chloride (KLOR-CON) 10 MEQ tablet Take 8 tablets (80 mEq total) by mouth daily. 240 tablet 11   rivaroxaban (XARELTO) 20 MG TABS tablet Take 1 tablet (20 mg total) by mouth daily with supper. 90 tablet 3   spironolactone (ALDACTONE) 50 MG tablet Take 1 tablet (50 mg total) by mouth daily. 30 tablet 11   tamsulosin (FLOMAX) 0.4 MG CAPS capsule Take 0.4 mg by mouth daily after supper.     TRUEplus Lancets 28G MISC Use as instructed. Check blood glucose level by fingerstick twice per day. 200 each 3   torsemide (DEMADEX) 20 MG tablet Take 3 tablets (60 mg total) by mouth every morning AND 2 tablets (40 mg total) every evening. 150 tablet 5   No current facility-administered medications for this encounter.   No Known Allergies  Social History   Socioeconomic History   Marital status: Single    Spouse name: Not on file   Number of children: Not on file   Years of education: Not on file   Highest education level: Not on file  Occupational History   Not on file  Tobacco Use   Smoking status: Former   Smokeless tobacco:  Never   Tobacco comments:    smoked weed in the past  Vaping Use   Vaping Use: Never used  Substance and Sexual Activity   Alcohol use: Not Currently   Drug use: Yes    Types: Marijuana, Cocaine   Sexual activity: Not Currently  Other Topics Concern   Not on file  Social History Narrative   Not on file   Social Determinants of Health   Financial Resource Strain: High Risk (02/23/2022)   Overall Financial Resource Strain (CARDIA)    Difficulty of Paying Living Expenses: Hard  Food Insecurity: No Food Insecurity (08/03/2021)   Hunger Vital Sign    Worried About Running Out of Food in the Last Year: Never true    Ran Out of Food in the Last Year: Never true  Transportation Needs: Unmet Transportation Needs (08/15/2022)   PRAPARE - Hydrologist (Medical): Yes    Lack of Transportation (Non-Medical): Yes  Physical Activity: Not on file  Stress: Not on file  Social Connections: Not on file  Intimate Partner Violence: Not on file   Family History  Problem Relation Age of Onset   Hypertension Mother    Diabetes Mother    BP 108/68   Pulse 99   Wt (!) 148.7 kg (327 lb 12.8 oz)   SpO2 93% Comment: 3l n/c  BMI 47.03 kg/m   Wt Readings from Last 3 Encounters:  08/16/22 (!) 148.7 kg (327 lb 12.8 oz)  08/15/22 (!) 146.5 kg (323 lb)  07/01/22 (!) 160.1 kg (353 lb)   PHYSICAL EXAM: General: NAD, obese.  Neck: No JVD, no thyromegaly or thyroid nodule.  Lungs: Clear to auscultation bilaterally with normal respiratory effort. CV: Nondisplaced PMI.  Heart regular S1/S2, no S3/S4, no murmur.  No peripheral edema.  No carotid bruit.  Normal pedal pulses.  Abdomen: Soft, nontender, no hepatosplenomegaly, no distention.  Skin: Intact without lesions or rashes.  Neurologic: Alert and oriented x 3.  Psych: Normal affect. Extremities: No clubbing or cyanosis.  HEENT: Normal.   ASSESSMENT & PLAN: 1. Chronic diastolic CHF: Echo 5/32 showed EF 60-65%,  moderate LVH, mid-cavity LV gradient to 29 mmHg, RV normal.  Suspect LVH is due to long-standing HTN.  NYHA class III symptoms, he does look mildly volume overloaded by exam today.   - Increase torsemide to 60 qam/40 qpm and continue KCl 80 daily. BMET/BNP today and BMET in 10 days.  - Continue Jardiance 10 mg daily. - Continue spironolactone 50 mg daily.  - Continue losartan 50 mg daily. - Continue hydralazine 50 mg tid - Cannot get Cardiomems as he has Medicaid.  2. Atrial fibrillation: Paroxysmal.  NSR today.   - Continue current dose of diltiazem.  - Continue Xarelto.  3. H/o PE: 5/20, continue Xarelto.  4. OHS/OSA: Uses O2 during the day and CPAP at night.  5. HTN: Controlled on current regimen.  6. DM: Per PCP. 7. Obesity: Body mass index is 47.03 kg/m.  - He has diabetes, I will see if we can get semaglutide for him for weight loss.  8. Cocaine: Relapsed recently. Sees a Social worker.   Follow up in 6 wks with APP.   Loralie Champagne  08/16/2022

## 2022-08-16 NOTE — Patient Instructions (Addendum)
EKG done today.  Labs done today. We will contact you only if your labs are abnormal.  INCREASE Torsemide to 60mg  (3 tablets) by mouth every morning and 40mg  (2 tablets) by mouth every evening.   No other medication changes were made. Please continue all current medications as prescribed.  You have been referred to the pharmacy clinic. They will contact you to schedule an appointment   Your physician recommends that you schedule a follow-up appointment in: 10 days for a lab only appointment and in 6 weeks with our NP/PA Clinic here in our office.   If you have any questions or concerns before your next appointment please send a message through [place] or call our office at [phone].    TO LEAVE A MESSAGE FOR THE NURSE SELECT OPTION 2, PLEASE LEAVE A MESSAGE INCLUDING: YOUR NAME DATE OF BIRTH CALL BACK NUMBER REASON FOR CALL**this is important as we prioritize the call backs  YOU WILL RECEIVE A CALL BACK THE SAME DAY AS LONG AS YOU CALL BEFORE 4:00 PM   Do the following things EVERYDAY: Weigh yourself in the morning before breakfast. Write it down and keep it in a log. Take your medicines as prescribed Eat low salt foods--Limit salt (sodium) to 2000 mg per day.  Stay as active as you can everyday Limit all fluids for the day to less than 2 liters   At the [facility], you and your health needs are our priority. As part of our continuing mission to provide you with exceptional heart care, we have created designated Provider Care Teams. These Care Teams include your primary Cardiologist (physician) and Advanced Practice Providers (APPs- Physician Assistants and Nurse Practitioners) who all work together to provide you with the care you need, when you need it.   You may see any of the following providers on your designated Care Team at your next follow up: Dr [address] [phone], NP Arvilla Meres, Carron Curie Robbie Lis,  PharmD   Please be sure to bring in all your medications bottles to every appointment.

## 2022-08-16 NOTE — Progress Notes (Signed)
CSW met with patient to sign transportation waiver form and assist with H&V food bag. Patient grateful for the support and will follow up with [name], LCSW as needed. Raquel Sarna, [place], [place]

## 2022-08-17 ENCOUNTER — Ambulatory Visit (INDEPENDENT_AMBULATORY_CARE_PROVIDER_SITE_OTHER): Payer: Medicaid Other | Admitting: Licensed Clinical Social Worker

## 2022-08-17 DIAGNOSIS — F1410 Cocaine abuse, uncomplicated: Secondary | ICD-10-CM | POA: Diagnosis not present

## 2022-08-17 NOTE — Group Note (Signed)
Group Topic: Communication  Group Date: 08/17/2022 Start Time:  9:00 AM End Time: 12:00 PM Facilitators: Cheri Fowler, [facility]  Department: [facility]  Number of Participants: 4  Group Focus: communication and conflict resolution Treatment Modality:  Cognitive Behavioral Therapy Interventions utilized were problem solving Purpose: enhance coping skills, improve communication skills, increase insight, and relapse prevention strategies  Name: Benjamin [name] Date of Birth: 02/03/1966  MR: [id number]    Level of Participation: active Quality of Participation: attention seeking and attentive Interactions with others: participated in group discussion, gave feedback, and monopolizing Mood/Affect: appropriate Triggers (if applicable): Some triggers to use currently Cognition: coherent/clear Progress: Gaining insight Response: Benjamin [name] participates in urine drug screen today. He reports that he has been feeling sick and that he is glad to be back in the group today. Benjamin [name] reports having free time in his schedule has been a trigger for him and he will end up in other neighborhoods. Plan: patient will be encouraged to remain sober and attend 12-step meeting  Patients Problems:  Patient Active Problem List   Diagnosis Date Noted   Substance induced mood disorder (HCC) 07/18/2022   Atrial fibrillation with RVR (HCC) 01/08/2021   Acute respiratory failure (HCC) 12/24/2020   Obstructive sleep apnea treated with BiPAP 12/24/2020   Polysubstance use disorder 08/20/2020   Alcohol abuse 08/20/2020   Atrial fibrillation with rapid ventricular response (HCC) 08/19/2020   Obesity hypoventilation syndrome (HCC) 06/12/2020   Healthcare maintenance 06/12/2020   Acute on chronic diastolic heart failure (HCC) 02/24/2020   Diabetic neuropathy (HCC) 11/20/2019   Coagulation disorder (HCC) 11/20/2019   Acute on chronic diastolic CHF (congestive heart failure), NYHA class 3 (HCC)  07/30/2019   Diabetes mellitus without complication (HCC) 07/16/2019   Hypertensive emergency    BRBPR (bright red blood per rectum) 06/12/2019   Hypertension    CHF (congestive heart failure) (HCC)    Sleep apnea    Chronic respiratory failure (HCC)    Hypokalemia 05/29/2019   GI bleed 05/29/2019   Anemia 05/28/2019   Iron deficiency anemia 03/11/2019   OSA on CPAP 03/11/2019   HLD (hyperlipidemia) 03/11/2019   Elevated troponin 03/11/2019   GERD (gastroesophageal reflux disease) 03/11/2019   Rectal bleeding 02/20/2019   Dyspnea 02/06/2019   COVID-19 virus infection 02/06/2019   Bilateral lower extremity edema    Morbid obesity with BMI of 50.0-59.9, adult (HCC)    PAF (paroxysmal atrial fibrillation) (HCC)    PE (pulmonary thromboembolism) (HCC) 01/21/2019   Hypertensive urgency 01/21/2019   Diabetes mellitus type 2 in obese (HCC) 01/21/2019     Family Program: Family present? No   Name of family member(s): 0  UDS collected: Yes Results: Pending  AA/NA attended?: No  Sponsor?: No

## 2022-08-18 ENCOUNTER — Ambulatory Visit (INDEPENDENT_AMBULATORY_CARE_PROVIDER_SITE_OTHER): Payer: Medicaid Other | Admitting: Nurse Practitioner

## 2022-08-18 ENCOUNTER — Encounter: Payer: Self-pay | Admitting: Nurse Practitioner

## 2022-08-18 VITALS — BP 140/82 | HR 87 | Ht 70.0 in | Wt 338.6 lb

## 2022-08-18 DIAGNOSIS — G4733 Obstructive sleep apnea (adult) (pediatric): Secondary | ICD-10-CM | POA: Diagnosis not present

## 2022-08-18 DIAGNOSIS — J9611 Chronic respiratory failure with hypoxia: Secondary | ICD-10-CM

## 2022-08-18 DIAGNOSIS — E662 Morbid (severe) obesity with alveolar hypoventilation: Secondary | ICD-10-CM | POA: Diagnosis not present

## 2022-08-18 NOTE — Patient Instructions (Addendum)
Restart Trilogy vent every night, minimum of 4-6 hours a night.  Change equipment every 30 days or as directed by DME. Wash your tubing with warm soap and water daily, hang to dry. Wash humidifier portion weekly.  Be aware of reduced alertness and do not drive or operate heavy machinery if experiencing this or drowsiness.  Exercise encouraged, as tolerated. Notify if persistent daytime sleepiness occurs even with consistent use of CPAP.  Outpatient arterial blood gas - someone will contact you for scheduling  After this, we should be able to get your Trilogy back and have insurance pay for it, per [employer].  Follow up in 6 weeks with Dr. Craige Cotta or [name] Pearline Yerby,NP to make sure your back on your machine and doing well. If symptoms worsen, please contact office for sooner follow up or seek emergency care.

## 2022-08-18 NOTE — Progress Notes (Signed)
_0  ID: Benjamin [name], male    DOB: September 10, 1966, 56 y.o.   MRN: [id number]  Chief Complaint  Patient presents with   Follow-up    Pt f/u he is currently needinga new order for a BiPAP, reports he wasn't sleeping    Referring provider: [name], NP  HPI: 56 year old male, former smoker followed for OSA/OHS, chronic respiratory failure, and chronic bronchitis. He is a patient of Dr. Juanetta Gosling and last seen in office 03/11/2022. Past medical history significant for HTN, HFpEF, PAF, hx of PE on Xarelto, GERD, DM, substance abuse, IDA, HLD, obesity.  TEST/EVENTS:  04/19/2019 ABG: pH 7.44, PCO2 49.5, PO2 59.9 05/17/2019 PSG: AHI 128.1, SpO2 low 51%,   03/11/2022: OV with Dr. Halford Chessman. Last seen November 2021. Using 3 lpm supplemental O2 24/7. Uses BiPAP nightly without issues. Has intermitted cough. Breathing improved with combivent.   08/18/2022: Today - follow up Patient presents today for follow up. He has been doing ok since he was here last. Previously wearing what he calls his BiPAP nightly without any issues. Unfortunately, a few months ago, he was struggling with a lot of life stressors and a court case against him so he wasn't sleeping much at night. Was only getting a few hours a night, if he slept at all. He would wear his BiPAP during this time. His insurance company denied to continue to pay for the machine so [facility] had to take it back. He has been without it now for around 6 weeks or possibly longer. He's feeling much more tired during the day. Feels like his sleep is restless and he wakes feeling groggy. He's also been told that he stops breathing and sounds like he's choking at night. He really wants to get restarted on therapy. He was told by [facility] that all he would need was an office visit and a new order. He denies drowsy driving, morning headaches, or sleep parasomnias/paralysis. His breathing has been up and down since not having his machine. He does feel a little  more congested in the mornings as well but this improves as he goes throughout his day. He did see cardiology recently due to weight gain and leg swelling. Dr. Aundra Dubin adjusted his torsemide a few days ago to 60 mg in AM and 40 mg in PM. He denies any increased oxygen demand. No chest pain or palpitations.   No Known Allergies  Immunization History  Administered Date(s) Administered   Influenza,inj,Quad PF,6+ Mos 06/04/2019, 06/12/2020   PFIZER(Purple Top)SARS-COV-2 Vaccination 01/02/2020, 01/29/2020   Pneumococcal Polysaccharide-23 04/30/2019   Tdap 04/30/2019    Past Medical History:  Diagnosis Date   A-fib ([place])    Anemia 05/29/2019   CHF (congestive heart failure) (HCC)    Chronic respiratory failure (HCC)    Diabetes mellitus without complication (HCC)    Dyspnea    Elevated troponin 02/06/2019   Hypertension    Obesity    Pulmonary embolism (HCC)    Rectal bleeding 05/29/2019   Sleep apnea     Tobacco History: Social History   Tobacco Use  Smoking Status Former  Smokeless Tobacco Never  Tobacco Comments   smoked weed in the past   Counseling given: Not Answered Tobacco comments: smoked weed in the past   Outpatient Medications Prior to Visit  Medication Sig Dispense Refill   albuterol (PROVENTIL) (2.5 MG/3ML) 0.083% nebulizer solution INHALE 1 VIAL BY NEBULIZATION EVERY 6 (SIX) HOURS AS NEEDED FOR WHEEZING OR SHORTNESS OF BREATH. 150  mL 1   atorvastatin (LIPITOR) 40 MG tablet Take 1 tablet (40 mg total) by mouth at bedtime. 90 tablet 3   Blood Glucose Monitoring Suppl (TRUE METRIX METER) w/Device KIT Use as instructed. Check blood glucose level by fingerstick twice per day. 1 kit 0   Blood Pressure Monitor DEVI Please provide patient with insurance approved blood pressure device with L-XL cuff. BMI 55 1 each 0   diltiazem (CARDIZEM CD) 360 MG 24 hr capsule Take 1 capsule (360 mg total) by mouth daily. 90 capsule 3   empagliflozin (JARDIANCE) 10 MG TABS tablet Take  1 tablet (10 mg total) by mouth daily before breakfast. 30 tablet 6   FEROSUL 325 (65 Fe) MG tablet TAKE 1 TABLET (325 MG TOTAL) BY MOUTH DAILY AT NOON. 90 tablet 0   glucose blood (TRUE METRIX BLOOD GLUCOSE TEST) test strip Use as instructed. Check blood glucose level by fingerstick twice per day. 100 each 12   hydrALAZINE (APRESOLINE) 50 MG tablet TAKE 1 TABLET (50 MG TOTAL) BY MOUTH 3 (THREE) TIMES DAILY. (NOON,EVENING,BEDTIME) 180 tablet 3   Ipratropium-Albuterol (COMBIVENT RESPIMAT) 20-100 MCG/ACT AERS respimat Inhale 1 puff into the lungs every 6 (six) hours. 4 g 5   losartan (COZAAR) 50 MG tablet Take 1 tablet (50 mg total) by mouth daily. 90 tablet 3   magnesium oxide (MAG-OX) 400 (240 Mg) MG tablet TAKE 1 TABLET (400 MG TOTAL) BY MOUTH 2 (TWO) TIMES DAILY (NOON+BEDTIME) 90 tablet 3   metFORMIN (GLUCOPHAGE) 500 MG tablet TAKE 1 TABLET BY MOUTH 2 (TWO) TIMES DAILY WITH A MEAL (NOON+BEDTIME) 60 tablet 0   omeprazole (PRILOSEC) 20 MG capsule TAKE 1 CAPSULE 2 (TWO) TIMES DAILY BEFORE A MEAL. TAKE FIRST CAPSULE 30 MIN PRIOR TO EATING OR TAKING OTHER MEDICATIONS. (AM+BEDTIME) 180 capsule 1   potassium chloride (KLOR-CON) 10 MEQ tablet Take 8 tablets (80 mEq total) by mouth daily. 240 tablet 11   rivaroxaban (XARELTO) 20 MG TABS tablet Take 1 tablet (20 mg total) by mouth daily with supper. 90 tablet 3   spironolactone (ALDACTONE) 50 MG tablet Take 1 tablet (50 mg total) by mouth daily. 30 tablet 11   tamsulosin (FLOMAX) 0.4 MG CAPS capsule Take 0.4 mg by mouth daily after supper.     torsemide (DEMADEX) 20 MG tablet Take 3 tablets (60 mg total) by mouth every morning AND 2 tablets (40 mg total) every evening. 150 tablet 5   TRUEplus Lancets 28G MISC Use as instructed. Check blood glucose level by fingerstick twice per day. 200 each 3   No facility-administered medications prior to visit.     Review of Systems:   Constitutional: No weight loss or gain, night sweats, fevers, chills, or  lassitude. +daytime fatigue (increased) HEENT: No headaches, difficulty swallowing, tooth/dental problems, or sore throat. No sneezing, itching, ear ache, nasal congestion, or post nasal drip CV:  +swelling in lower extremities, orthopnea. No chest pain, PND, anasarca, dizziness, palpitations, syncope Resp: +shortness of breath with exertion; occasional chest congestion; witnessed nocturnal apneas; snoring/gasping. No excess mucus or change in color of mucus. No productive or non-productive. No hemoptysis. No wheezing.  No chest wall deformity GI:  No heartburn, indigestion, abdominal pain GU: No dysuria, change in color of urine, urgency or frequency, nocturia Neuro: No dizziness or lightheadedness.  Psych: No depression or anxiety. Mood stable. +sleep disturbance    Physical Exam:  BP (!) 140/82   Pulse 87   Ht _0  (1.778 m)   Wt Marland Kitchen)  338 lb 9.6 oz (153.6 kg)   SpO2 97%   BMI 48.58 kg/m   GEN: Pleasant, interactive, chronically-ill appearing; morbidly obese; in no acute distress. HEENT:  Normocephalic and atraumatic. PERRLA. Sclera white. Nasal turbinates pink, moist and patent bilaterally. No rhinorrhea present. Oropharynx pink and moist, without exudate or edema. No lesions, ulcerations, or postnasal drip. Mallampati III/IV NECK:  Supple w/ fair ROM. No JVD present. Normal carotid impulses w/o bruits. Thyroid symmetrical with no goiter or nodules palpated. No lymphadenopathy.   CV: RRR, no m/r/g, dependent BLE edema. Pulses intact, +2 bilaterally. No cyanosis, pallor or clubbing. PULMONARY:  Unlabored, regular breathing. Clear bilaterally A&P w/o wheezes/rales/rhonchi. No accessory muscle use.  GI: BS present and normoactive. Soft, non-tender to palpation. No organomegaly or masses detected.  MSK: No erythema, warmth or tenderness. Cap refil <2 sec all extrem. No deformities or joint swelling noted.  Neuro: A/Ox3. No focal deficits noted.   Skin: Warm, no lesions or rashe Psych:  Normal affect and behavior. Judgement and thought content appropriate.     Lab Results:  CBC    Component Value Date/Time   WBC 6.1 07/18/2022 1045   RBC 4.71 07/18/2022 1045   HGB 12.2 (L) 07/18/2022 1045   HGB 12.8 (L) 12/21/2020 1032   HCT 38.6 (L) 07/18/2022 1045   HCT 39.9 12/21/2020 1032   PLT 473 (H) 07/18/2022 1045   PLT 383 12/21/2020 1032   MCV 82.0 07/18/2022 1045   MCV 79 12/21/2020 1032   MCH 25.9 (L) 07/18/2022 1045   MCHC 31.6 07/18/2022 1045   RDW 14.0 07/18/2022 1045   RDW 14.7 12/21/2020 1032   LYMPHSABS 1.6 07/18/2022 1045   MONOABS 0.5 07/18/2022 1045   EOSABS 0.3 07/18/2022 1045   BASOSABS 0.0 07/18/2022 1045    BMET    Component Value Date/Time   NA 138 08/16/2022 1218   NA 141 03/30/2022 1135   K 3.7 08/16/2022 1218   CL 100 08/16/2022 1218   CO2 28 08/16/2022 1218   GLUCOSE 109 (H) 08/16/2022 1218   BUN 23 (H) 08/16/2022 1218   BUN 14 03/30/2022 1135   CREATININE 1.59 (H) 08/16/2022 1218   CALCIUM 9.5 08/16/2022 1218   GFRNONAA 51 (L) 08/16/2022 1218   GFRAA >60 03/02/2020 1247    BNP    Component Value Date/Time   BNP 12.3 08/16/2022 1218     Imaging:  No results found.        No data to display          No results found for: "NITRICOXIDE"      Assessment & Plan:   Sleep apnea Very severe OSA/OHS. Contacted [facility] today. He was previously on Trilogy vent. Per their records, he had excellent compliance aside from the brief few weeks that he reported to [place]. Medicaid had declined to keep paying for it. Estill Bamberg with [facility] stated he would not need to repeat a sleep study if outpatient ABG reveals elevated CO2. We have ordered this today and will schedule it ASAP.  The patient continues to exhibit signs of hypercapnea associated with chronic respiratory failure secondary to obesity hyperventilation syndrome and severe obstructive sleep apnea.  Previous ABG's have documented high PCO2. BiPAP was tried and failed in the  sleep lab setting. Interruption or failure to provide NIV would quickly lead to exacerbation of the patient's condition, hospital readmission, and likely harm the patient.  Continued use is preferred.  The use of the NIV will treat the patient's PCO2  levels and can reduce the risk of exacerbations and future hospitalizations when used at night and during the day.  Bilevel/RAD therapy with and without a rate would be ineffective as the patient requires a volume targeted mode.  Ventilation is required to decrease the work of breathing and improve pulmonary status.  Interruption of ventilator support would lead to a decline of health status.  Patient is able to protect their airways and clear secretions on their own.  He was advised to notify us if he has any further difficulties and does not get his machine back within the next few weeks. Cautioned on safe driving practices.  Patient Instructions  Restart Trilogy vent every night, minimum of 4-6 hours a night.  Change equipment every 30 days or as directed by DME. Wash your tubing with warm soap and water daily, hang to dry. Wash humidifier portion weekly.  Be aware of reduced alertness and do not drive or operate heavy machinery if experiencing this or drowsiness.  Exercise encouraged, as tolerated. Notify if persistent daytime sleepiness occurs even with consistent use of CPAP.  Outpatient arterial blood gas - someone will contact you for scheduling  After this, we should be able to get your Trilogy back and have insurance pay for it, per [facility].  Follow up in 6 weeks with Dr. Halford Chessman or Joellen Jersey Ellanore Vanhook,NP to make sure your back on your machine and doing well. If symptoms worsen, please contact office for sooner follow up or seek emergency care.    Chronic respiratory failure (HCC) Stable without increased O2 demand. Continue supplemental oxygen 2-3 lpm for goal >88-90%  Obesity hypoventilation syndrome ([place]) See above. BMI 48. Healthy weight loss  encouraged.    I spent 35 minutes of dedicated to the care of this patient on the date of this encounter to include pre-visit review of records, face-to-face time with the patient discussing conditions above, post visit ordering of testing, clinical documentation with the electronic health record, making appropriate referrals as documented, and communicating necessary findings to members of the patients care team.  Clayton Bibles, NP 08/25/2022  Pt aware and understands NP's role.

## 2022-08-18 NOTE — Assessment & Plan Note (Addendum)
Very severe OSA/OHS. Contacted [facility] today. He was previously on Trilogy vent. Per their records, he had excellent compliance aside from the brief few weeks that he reported to [place]. Medicaid had declined to keep paying for it. Marchelle Folks with [facility] stated he would not need to repeat a sleep study if outpatient ABG reveals elevated CO2. We have ordered this today and will schedule it ASAP.  The patient continues to exhibit signs of hypercapnea associated with chronic respiratory failure secondary to obesity hyperventilation syndrome and severe obstructive sleep apnea.  Previous ABG's have documented high PCO2. BiPAP was tried and failed in the sleep lab setting. Interruption or failure to provide NIV would quickly lead to exacerbation of the patient's condition, hospital readmission, and likely harm the patient.  Continued use is preferred.  The use of the NIV will treat the patient's PCO2 levels and can reduce the risk of exacerbations and future hospitalizations when used at night and during the day.  Bilevel/RAD therapy with and without a rate would be ineffective as the patient requires a volume targeted mode.  Ventilation is required to decrease the work of breathing and improve pulmonary status.  Interruption of ventilator support would lead to a decline of health status.  Patient is able to protect their airways and clear secretions on their own.  He was advised to notify us if he has any further difficulties and does not get his machine back within the next few weeks. Cautioned on safe driving practices.  Patient Instructions  Restart Trilogy vent every night, minimum of 4-6 hours a night.  Change equipment every 30 days or as directed by DME. Wash your tubing with warm soap and water daily, hang to dry. Wash humidifier portion weekly.  Be aware of reduced alertness and do not drive or operate heavy machinery if experiencing this or drowsiness.  Exercise encouraged, as tolerated. Notify if persistent  daytime sleepiness occurs even with consistent use of CPAP.  Outpatient arterial blood gas - someone will contact you for scheduling  After this, we should be able to get your Trilogy back and have insurance pay for it, per [facility].  Follow up in 6 weeks with Dr. Craige Cotta or [name] David Towson,NP to make sure your back on your machine and doing well. If symptoms worsen, please contact office for sooner follow up or seek emergency care.

## 2022-08-18 NOTE — Assessment & Plan Note (Signed)
Stable without increased O2 demand. Continue supplemental oxygen 2-3 lpm for goal >88-90%

## 2022-08-19 ENCOUNTER — Ambulatory Visit (INDEPENDENT_AMBULATORY_CARE_PROVIDER_SITE_OTHER): Payer: Medicaid Other | Admitting: Psychiatry

## 2022-08-19 ENCOUNTER — Ambulatory Visit (INDEPENDENT_AMBULATORY_CARE_PROVIDER_SITE_OTHER): Payer: Medicaid Other | Admitting: Licensed Clinical Social Worker

## 2022-08-19 VITALS — BP 123/86 | HR 84

## 2022-08-19 DIAGNOSIS — F1410 Cocaine abuse, uncomplicated: Secondary | ICD-10-CM | POA: Diagnosis not present

## 2022-08-19 DIAGNOSIS — F1994 Other psychoactive substance use, unspecified with psychoactive substance-induced mood disorder: Secondary | ICD-10-CM | POA: Diagnosis not present

## 2022-08-19 MED ORDER — SERTRALINE HCL 25 MG PO TABS: 25.0000 mg | ORAL_TABLET | Freq: Every day | ORAL | 1 refills | Status: DC

## 2022-08-19 NOTE — Progress Notes (Cosign Needed Addendum)
Psychiatric Initial Adult Assessment   Patient Identification: Benjamin [name] MRN:  [id number] Date of Evaluation:  08/19/2022 Referral Source: Archie Patten NP Chief Complaint:   Chief Complaint  Patient presents with   Depression   Visit Diagnosis:    ICD-10-CM   1. Substance induced mood disorder ([place])  F19.94 sertraline (ZOLOFT) 25 MG tablet    2. Cocaine abuse (HCC)  F14.10       History of Present Illness: Patient is a 56 year old male with past psychiatric history of substance-induced mood disorder, cocaine abuse and medical history significant for atrial fibrillation, chronic CHF, history of PE, OSA, hypertension  Presented walk in to outpatient [facility] for depression.  Patient is currently in outpatient CD IOP at [facility] for cocaine abuse.  Patient sees counselor at [facility] patient and has not used cocaine for last 1 month. Patient states that he has been feeling depressed and was using a lot of cocaine 1 month ago.  He reports that he has not used cocaine since he has been in the outpatient rehab program.  He reports that he still has a lot of cravings and feels depressed.  He reports that he has been feeling depressed since his mom passed away 2 years ago. He states that she was on life support and he made the decision to pull her life support.  He reports that she was the only support for him and now she is gone.  He reports that whenever he feels depressed, he starts using cocaine again and when he stops cocaine then he feels depressed and the cycle continues.  He reports that he went to prison twice and spent 9 years in state prison and 11-12 days in federal prison for selling guns and drugs.  He came out of jail in 2020 and then he then got severe COVID and was intubated and was on life support.  He reports that since he had COVID he has multiple medical issues including chronic pain in his whole body.  He reports that because of severe chronic pain he gets anxious, irritable and  angry.  He reports that if somebody irritates him he feels like hurting them because of his pain and irritability.  He reports ruminations and has been thinking about his mom and her death again and again in his mind.  He wants to start medications to help with ruminations, depression and cocaine cravings.  He reports that because of his cardiac issues and current cardiac medications his cardiologist told him that psychiatry can coordinate with his cardiologist Dr. Aundra Dubin before prescribing psych medication.  He reports having bad OSA and uses CPAP machine at night.  He reports that recently his CPAP machine was taken away from him for but he will get it back soon.   He endorses depressed mood, poor sleep due to not having CPAP, variable appetite, anhedonia, fatigue,  low energy, hopelessness, no motivation, helplessness, worthlessness, feeling guilty, decreased concentration, and poor memory. He reports vague manic symptoms and episodes with high energy, when he feels happy, talks a lot, spend more money , sleeps less, feels like he is on top of the world, do not care about anything, feels irritable and angry.  He reports that when he does not sleep at night he feels fatigued during the daytime.  He reports that these episodes last usually for 2-3 days and sometimes for 4 or more days.  He reports that he was using drugs when he had these episodes but  he is reporting high energy manic type episode last week when he was not using cocaine.    Currently, He denies active Suicidal ideations, Homicidal ideations, auditory and visual hallucinations.  He reports that if somebody annoys him then he feels like hurting those people.  He denies any paranoia.  He denies any history of physical, verbal, and sexual abuse. He endorses flashbacks and intrusive thoughts related to his mother's death.   Past Psychiatric Hx:  Previous Psych Diagnoses: Substance-induced mood disorder, cocaine abuse Prior inpatient  treatment: 1 psychiatric inpatient admission when he was a child..  Multiple rehab admissions at different facilities Current meds: No psychiatric medications Psychotherapy hx: Currently getting therapy in CD IOP Previous suicidal attempts: Denies Previous medication trials: Has tried 1 medication for depression which made him angry and agitated.  Does not remember its name Current therapist: None  Substance Abuse Hx: Alcohol: Denies Tobacco:Denies Illicit drugs-was using cocaine every day.  Has not used since 1 month Rehab hx: Had been to multiple rehab facilities including [facility], [facility] and others Seizures, DUI's, DT's- Denies  Past Medical History: Medical Diagnoses: Chronic CHF, atrial fibrillation, history of PE, OSA, hypertension, DM Home Rx: Diltiazem, Xarelto, Jardiance, spironolactone, Lipitor, albuterol, hydralazine, losartan, magnesium oxide, metformin, Prilosec H/o seizures: Denies Allergies: NKDA Cardiologist Dr. 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 to prison twice in federal prison and [employer] prison for selling drugs and gun (came out in 2020, currently on parole) Associated Signs/Symptoms: Depression Symptoms:  depressed mood, anhedonia, insomnia, fatigue, feelings of worthlessness/guilt, difficulty concentrating, hopelessness, impaired memory, anxiety, disturbed sleep, Variable appetite (Hypo) Manic Symptoms:  Irritable Mood, Labiality of Mood, ruminations Anxiety Symptoms:  Excessive Worry, Psychotic Symptoms:   none PTSD Symptoms: Had a traumatic exposure:  See HPI Re-experiencing:  Flashbacks Intrusive Thoughts  Past Psychiatric History: see HPI  Previous Psychotropic Medications: Yes   Substance Abuse History in the last 12 months:   Yes.    Consequences of Substance Abuse: Medical Consequences:  Mood disorder Legal Consequences:  Went to prison twice  Past Medical History:  Past Medical History:  Diagnosis Date   A-fib ([place])    Anemia 05/29/2019   CHF (congestive heart failure) (HCC)    Chronic respiratory failure (HCC)    Diabetes mellitus without complication (HCC)    Dyspnea    Elevated troponin 02/06/2019   Hypertension    Obesity    Pulmonary embolism (HCC)    Rectal bleeding 05/29/2019   Sleep apnea     Past Surgical History:  Procedure Laterality Date   CARDIOVERSION N/A 08/21/2020   Procedure: CARDIOVERSION;  Surgeon: Larey Dresser, MD;  Location: [place];  Service: Cardiovascular;  Laterality: N/A;   COLONOSCOPY WITH PROPOFOL Left 02/25/2019   Procedure: COLONOSCOPY WITH PROPOFOL;  Surgeon: Arta Silence, MD;  Location: [place];  Service: Endoscopy;  Laterality: Left;   ESOPHAGOGASTRODUODENOSCOPY (EGD) WITH PROPOFOL Left 02/25/2019   Procedure: ESOPHAGOGASTRODUODENOSCOPY (EGD) WITH PROPOFOL;  Surgeon: Arta Silence, MD;  Location: [facility] ENDOSCOPY;  Service: Endoscopy;  Laterality: Left;   ESOPHAGOGASTRODUODENOSCOPY (EGD) WITH PROPOFOL N/A 06/12/2019   Procedure: ESOPHAGOGASTRODUODENOSCOPY (EGD) WITH PROPOFOL;  Surgeon: Clarene Essex, MD;  Location: [place];  Service: Gastroenterology;  Laterality: N/A;   FLEXIBLE SIGMOIDOSCOPY N/A 06/12/2019   Procedure: FLEXIBLE SIGMOIDOSCOPY;  Surgeon: Clarene Essex, MD;  Location:  [place] ENDOSCOPY;  Service: Gastroenterology;  Laterality: N/A;   HEMORRHOID BANDING  05/2019   NO PAST SURGERIES     RIGHT HEART CATH N/A 04/17/2019   Procedure: RIGHT HEART CATH;  Surgeon: Larey Dresser, MD;  Location: [place] CV LAB;  Service: Cardiovascular;  Laterality: N/A;   TEE WITHOUT CARDIOVERSION N/A 08/21/2020   Procedure: TRANSESOPHAGEAL ECHOCARDIOGRAM (TEE);  Surgeon: Larey Dresser, MD;  Location: [facility] ENDOSCOPY;  Service: Cardiovascular;  Laterality: N/A;     Family Psychiatric History: see HPI  Family History:  Family History  Problem Relation Age of Onset   Hypertension Mother    Diabetes Mother     Social History:   Social History   Socioeconomic History   Marital status: Single    Spouse name: Not on file   Number of children: Not on file   Years of education: Not on file   Highest education level: Not on file  Occupational History   Not on file  Tobacco Use   Smoking status: Former   Smokeless tobacco: Never   Tobacco comments:    smoked weed in the past  Vaping Use   Vaping Use: Never used  Substance and Sexual Activity   Alcohol use: Not Currently   Drug use: Yes    Types: Marijuana, Cocaine   Sexual activity: Not Currently  Other Topics Concern   Not on file  Social History Narrative   Not on file   Social Determinants of Health   Financial Resource Strain: High Risk (02/23/2022)   Overall Financial Resource Strain (CARDIA)    Difficulty of Paying Living Expenses: Hard  Food Insecurity: No Food Insecurity (08/03/2021)   Hunger Vital Sign    Worried About Running Out of Food in the Last Year: Never true    Ran Out of Food in the Last Year: Never true  Transportation Needs: Unmet Transportation Needs (08/15/2022)   PRAPARE - Hydrologist (Medical): Yes    Lack of Transportation (Non-Medical): Yes  Physical Activity: Not on file  Stress: Not on file  Social Connections: Not on file    Additional Social History: see HPI  Allergies:  No Known Allergies  Metabolic Disorder Labs: Lab Results  Component Value Date   HGBA1C 6.3 (H) 07/18/2022   MPG 134.11 07/18/2022   MPG 145.59 02/16/2022   Lab Results  Component Value Date   PROLACTIN 7.0 07/18/2022   Lab Results  Component Value Date   CHOL 101 07/18/2022   TRIG 36 07/18/2022   HDL 45 07/18/2022   CHOLHDL 2.2 07/18/2022   VLDL 7 07/18/2022   [place] 49 07/18/2022   [place] 65 02/16/2022   Lab Results   Component Value Date   TSH 1.648 07/18/2022    Therapeutic Level Labs: No results found for: "LITHIUM" No results found for: "CBMZ" No results found for: "VALPROATE"  Current Medications: Current Outpatient Medications  Medication Sig Dispense Refill   sertraline (ZOLOFT) 25 MG tablet Take 1 tablet (25 mg total) by mouth daily. 30 tablet 1   albuterol (PROVENTIL) (2.5 MG/3ML) 0.083% nebulizer solution INHALE 1 VIAL BY NEBULIZATION EVERY 6 (SIX) HOURS AS NEEDED FOR WHEEZING OR SHORTNESS OF BREATH. 150 mL 1   atorvastatin (LIPITOR) 40 MG tablet Take 1 tablet (40 mg total) by mouth at bedtime. 90 tablet 3   Blood Glucose Monitoring Suppl (TRUE METRIX METER) w/Device KIT Use as instructed. Check blood glucose level by fingerstick twice per day. 1 kit  0   Blood Pressure Monitor DEVI Please provide patient with insurance approved blood pressure device with L-XL cuff. BMI 55 1 each 0   diltiazem (CARDIZEM CD) 360 MG 24 hr capsule Take 1 capsule (360 mg total) by mouth daily. 90 capsule 3   empagliflozin (JARDIANCE) 10 MG TABS tablet Take 1 tablet (10 mg total) by mouth daily before breakfast. 30 tablet 6   FEROSUL 325 (65 Fe) MG tablet TAKE 1 TABLET (325 MG TOTAL) BY MOUTH DAILY AT NOON. 90 tablet 0   glucose blood (TRUE METRIX BLOOD GLUCOSE TEST) test strip Use as instructed. Check blood glucose level by fingerstick twice per day. 100 each 12   hydrALAZINE (APRESOLINE) 50 MG tablet TAKE 1 TABLET (50 MG TOTAL) BY MOUTH 3 (THREE) TIMES DAILY. (NOON,EVENING,BEDTIME) 180 tablet 3   Ipratropium-Albuterol (COMBIVENT RESPIMAT) 20-100 MCG/ACT AERS respimat Inhale 1 puff into the lungs every 6 (six) hours. 4 g 5   losartan (COZAAR) 50 MG tablet Take 1 tablet (50 mg total) by mouth daily. 90 tablet 3   magnesium oxide (MAG-OX) 400 (240 Mg) MG tablet TAKE 1 TABLET (400 MG TOTAL) BY MOUTH 2 (TWO) TIMES DAILY (NOON+BEDTIME) 90 tablet 3   metFORMIN (GLUCOPHAGE) 500 MG tablet TAKE 1 TABLET BY MOUTH 2 (TWO)  TIMES DAILY WITH A MEAL (NOON+BEDTIME) 60 tablet 0   omeprazole (PRILOSEC) 20 MG capsule TAKE 1 CAPSULE 2 (TWO) TIMES DAILY BEFORE A MEAL. TAKE FIRST CAPSULE 30 MIN PRIOR TO EATING OR TAKING OTHER MEDICATIONS. (AM+BEDTIME) 180 capsule 1   potassium chloride (KLOR-CON) 10 MEQ tablet Take 8 tablets (80 mEq total) by mouth daily. 240 tablet 11   rivaroxaban (XARELTO) 20 MG TABS tablet Take 1 tablet (20 mg total) by mouth daily with supper. 90 tablet 3   spironolactone (ALDACTONE) 50 MG tablet Take 1 tablet (50 mg total) by mouth daily. 30 tablet 11   tamsulosin (FLOMAX) 0.4 MG CAPS capsule Take 0.4 mg by mouth daily after supper.     torsemide (DEMADEX) 20 MG tablet Take 3 tablets (60 mg total) by mouth every morning AND 2 tablets (40 mg total) every evening. 150 tablet 5   TRUEplus Lancets 28G MISC Use as instructed. Check blood glucose level by fingerstick twice per day. 200 each 3   No current facility-administered medications for this visit.    Musculoskeletal: Strength & Muscle Tone: within normal limits Gait & Station:  Walks slow due to pain Patient leans: N/A  Psychiatric Specialty Exam: Review of Systems  Blood pressure 123/86, pulse 84, SpO2 96 %.There is no height or weight on file to calculate BMI.  General Appearance: Disheveled  Eye Contact:  Fair  Speech:  Clear and Coherent  Volume:  Normal  Mood:  Depressed  Affect:  Constricted  Thought Process:  Coherent and Linear  Orientation:  Full (Time, Place, and Person)  Thought Content:  Logical  Suicidal Thoughts:  No  Homicidal Thoughts:  No  Memory:  Immediate;   Good Recent;   Fair Remote;   Fair  Judgement:  Poor  Insight:  Fair  Psychomotor Activity:  Normal  Concentration:  Concentration: Good and Attention Span: Fair  Recall:  Good  Fund of Knowledge:Good  Language: Good  Akathisia:  No  Handed:  Right  AIMS (if indicated):  not done  Assets:  Communication Skills Desire for Improvement Housing  ADL's:   Intact  Cognition: WNL  Sleep:  Fair   Screenings: GAD-7    Personnel officer  Visit from 03/30/2022 in [place] Office Visit from 12/21/2020 in [place] Office Visit from 02/18/2019 in [place]  Total GAD-7 Score 15 0 12      PHQ2-9    Flowsheet Row ED from 07/18/2022 [facility] Office Visit from 03/30/2022 in [place] Office Visit from 12/21/2020 in [place] Office Visit from 04/30/2019 in [place] Office Visit from 02/18/2019 in [place]  PHQ-2 Total Score 4 2 0 5 2  PHQ-9 Total Score 16 8 0 20 7      [place] ED from 07/18/2022 in [facility] ED from 02/16/2022 in [place] ED from 07/20/2021 in [facility] [place] No Risk No Risk No Risk       Assessment and Plan: Patient is a 56 year old male with past psychiatric history of substance-induced mood disorder, cocaine abuse and medical history significant for atrial fibrillation, chronic CHF, history of PE, OSA, hypertension presented walk in to outpatient [facility] for depression .  Patient is reporting neurovegetative symptoms of depression due to cocaine abuse ,past trauma and multiple medical issues.  He has not been using cocaine for last 1 month and has been attending outpatient rehab at [facility].  He wants to start antidepressants to help with depression and ruminations associated with his mom's death. Will start patient on low-dose Zoloft to help with depression.  Recommend continued cocaine cessation. Pt can talk to his cardiologist to review medications.  Patient was asked to wait outside but he did not wait and left.  Try to call him but no answer so left message. Called patient  again on 12/5 and I was able to talk to patient and inform him that he has an antidepressant prescription at his pharmacy. He verbalizes understanding. Labs reviewed last BMP shows glucose 109, BUN high at 23, creatinine high at 1.59 and GFR low at 51, normal LFTs Urine drug screen from 10/30 positive for cocaine.  Last lipid panel-WNL, Hb A1c 6.3  Substance-induced mood disorder Cocaine abuse -Start Zoloft 25 mg daily to help with depression and anxiety.   30-day prescription sent to patient's pharmacy with 1 refill. -Recommend continued cocaine cessation -Continue CD IOP  OSA -Continue using CPAP machine at night  Medical issues and cardiac issues -Per primary and cardiology  Follow-up in 4 weeks Collaboration of Care: Other Dr Sande Rives  Patient/Guardian was advised Release of Information must be obtained prior to any record release in order to collaborate their care with an outside provider. Patient/Guardian was advised if they have not already done so to contact the registration department to sign all necessary forms in order for [place] to release information regarding their care.   Consent: Patient/Guardian gives verbal consent for treatment and assignment of benefits for services provided during this visit. Patient/Guardian expressed understanding and agreed to proceed.   Armando Reichert, MD 12/1/20235:27 PM

## 2022-08-22 ENCOUNTER — Ambulatory Visit (INDEPENDENT_AMBULATORY_CARE_PROVIDER_SITE_OTHER): Payer: Medicaid Other | Admitting: Licensed Clinical Social Worker

## 2022-08-22 DIAGNOSIS — F1410 Cocaine abuse, uncomplicated: Secondary | ICD-10-CM | POA: Diagnosis not present

## 2022-08-23 DIAGNOSIS — J9611 Chronic respiratory failure with hypoxia: Secondary | ICD-10-CM | POA: Diagnosis not present

## 2022-08-23 DIAGNOSIS — I5032 Chronic diastolic (congestive) heart failure: Secondary | ICD-10-CM | POA: Diagnosis not present

## 2022-08-23 NOTE — Group Note (Signed)
Group Topic: Substance Abuse Treatment  Group Date: 08/22/2022 Start Time:  9:00 AM End Time: 12:00 PM Facilitators: Cheri Fowler, [facility]  Department: [facility]  Number of Participants: 5  Group Focus: chemical dependency issues and substance abuse education Treatment Modality:  Cognitive Behavioral Therapy Interventions utilized were assignment, clarification, exploration, group exercise, and patient education Purpose: enhance coping skills, explore maladaptive thinking, express feelings, increase insight, and trigger / craving management  Name: Benjamin [name] Date of Birth: [birth year]  MR: [id number]    Level of Participation: active Quality of Participation: attentive and cooperative Interactions with others: gave feedback and monopolizing Mood/Affect: appropriate Triggers (if applicable): Physical pain Cognition: coherent/clear Progress: Gaining insight Response: Client is alert and oriented to group. Client participates in group discussion about communication and stages of relapse. [name] reports he is triggered and craving cocaine a lot due to physical pain currently. He process his concerns and coping skills he can use. Plan: patient will be encouraged to manage triggers and practice healthy coping skills  Patients Problems:  Patient Active Problem List   Diagnosis Date Noted   Substance induced mood disorder (HCC) 07/18/2022   Atrial fibrillation with RVR (HCC) 01/08/2021   Acute respiratory failure (HCC) 12/24/2020   Obstructive sleep apnea treated with BiPAP 12/24/2020   Polysubstance use disorder 08/20/2020   Alcohol abuse 08/20/2020   Atrial fibrillation with rapid ventricular response (HCC) 08/19/2020   Obesity hypoventilation syndrome (HCC) 06/12/2020   Healthcare maintenance 06/12/2020   Acute on chronic diastolic heart failure (HCC) 02/24/2020   Diabetic neuropathy (HCC) 11/20/2019   Coagulation disorder (HCC) 11/20/2019   Acute  on chronic diastolic CHF (congestive heart failure), NYHA class 3 (HCC) 07/30/2019   Diabetes mellitus without complication (HCC) 07/16/2019   Hypertensive emergency    BRBPR (bright red blood per rectum) 06/12/2019   Hypertension    CHF (congestive heart failure) (HCC)    Sleep apnea    Chronic respiratory failure (HCC)    Hypokalemia 05/29/2019   GI bleed 05/29/2019   Anemia 05/28/2019   Iron deficiency anemia 03/11/2019   OSA on CPAP 03/11/2019   HLD (hyperlipidemia) 03/11/2019   Elevated troponin 03/11/2019   GERD (gastroesophageal reflux disease) 03/11/2019   Rectal bleeding 02/20/2019   Dyspnea 02/06/2019   COVID-19 virus infection 02/06/2019   Bilateral lower extremity edema    Morbid obesity with BMI of 50.0-59.9, adult (HCC)    PAF (paroxysmal atrial fibrillation) (HCC)    PE (pulmonary thromboembolism) (HCC) 01/21/2019   Hypertensive urgency 01/21/2019   Diabetes mellitus type 2 in obese (HCC) 01/21/2019     Family Program: Family present? No   Name of family member(s): 0  UDS collected: No Results: pending  AA/NA attended?: No  Sponsor?: No

## 2022-08-23 NOTE — Group Note (Signed)
Group Topic: Relapse Prevention  Group Date: 08/19/2022 Start Time:  9:00 AM End Time: 12:00 PM Facilitators: Cheri Fowler, [facility]  Department: [facility]  Number of Participants: 5  Group Focus: relapse prevention Treatment Modality:  Cognitive Behavioral Therapy Interventions utilized were patient education and problem solving Purpose: explore maladaptive thinking and relapse prevention strategies  Name: Benjamin [name] Date of Birth: 1965/12/06  MR: [id number]    Level of Participation: active Quality of Participation: attentive, cooperative, and side talking Interactions with others: participated in group discuss, gave feedback, and monopolizing Mood/Affect: appropriate Triggers (if applicable): Pain and sickness Cognition: coherent/clear Progress: Gaining insight Response: Client is alert and oriented to group. Client participates in group discussion about communication and stages of relapse. [name] arrived to group at 10:30am today due to oversleeping. He is permitted to group. Plan: patient will be encouraged to remain sober and attend medical appointment for depressive symptoms.  Patients Problems:  Patient Active Problem List   Diagnosis Date Noted   Substance induced mood disorder (HCC) 07/18/2022   Atrial fibrillation with RVR (HCC) 01/08/2021   Acute respiratory failure (HCC) 12/24/2020   Obstructive sleep apnea treated with BiPAP 12/24/2020   Polysubstance use disorder 08/20/2020   Alcohol abuse 08/20/2020   Atrial fibrillation with rapid ventricular response (HCC) 08/19/2020   Obesity hypoventilation syndrome (HCC) 06/12/2020   Healthcare maintenance 06/12/2020   Acute on chronic diastolic heart failure (HCC) 02/24/2020   Diabetic neuropathy (HCC) 11/20/2019   Coagulation disorder (HCC) 11/20/2019   Acute on chronic diastolic CHF (congestive heart failure), NYHA class 3 (HCC) 07/30/2019   Diabetes mellitus without complication  (HCC) 07/16/2019   Hypertensive emergency    BRBPR (bright red blood per rectum) 06/12/2019   Hypertension    CHF (congestive heart failure) (HCC)    Sleep apnea    Chronic respiratory failure (HCC)    Hypokalemia 05/29/2019   GI bleed 05/29/2019   Anemia 05/28/2019   Iron deficiency anemia 03/11/2019   OSA on CPAP 03/11/2019   HLD (hyperlipidemia) 03/11/2019   Elevated troponin 03/11/2019   GERD (gastroesophageal reflux disease) 03/11/2019   Rectal bleeding 02/20/2019   Dyspnea 02/06/2019   COVID-19 virus infection 02/06/2019   Bilateral lower extremity edema    Morbid obesity with BMI of 50.0-59.9, adult (HCC)    PAF (paroxysmal atrial fibrillation) (HCC)    PE (pulmonary thromboembolism) (HCC) 01/21/2019   Hypertensive urgency 01/21/2019   Diabetes mellitus type 2 in obese (HCC) 01/21/2019     Family Program: Family present? No   Name of family member(s): 0  UDS collected: No Results: pending  AA/NA attended?: No  Sponsor?: No

## 2022-08-24 ENCOUNTER — Ambulatory Visit (HOSPITAL_COMMUNITY): Payer: Medicaid Other

## 2022-08-24 ENCOUNTER — Ambulatory Visit (INDEPENDENT_AMBULATORY_CARE_PROVIDER_SITE_OTHER): Payer: Medicaid Other | Admitting: Licensed Clinical Social Worker

## 2022-08-24 DIAGNOSIS — F1410 Cocaine abuse, uncomplicated: Secondary | ICD-10-CM | POA: Diagnosis not present

## 2022-08-25 ENCOUNTER — Telehealth (HOSPITAL_COMMUNITY): Payer: Self-pay | Admitting: Licensed Clinical Social Worker

## 2022-08-25 ENCOUNTER — Encounter: Payer: Self-pay | Admitting: Nurse Practitioner

## 2022-08-25 NOTE — Assessment & Plan Note (Signed)
See above. BMI 48. Healthy weight loss encouraged.

## 2022-08-26 ENCOUNTER — Ambulatory Visit (HOSPITAL_COMMUNITY): Payer: Medicaid Other

## 2022-08-26 ENCOUNTER — Other Ambulatory Visit (HOSPITAL_COMMUNITY): Payer: Medicaid Other

## 2022-08-26 ENCOUNTER — Ambulatory Visit (HOSPITAL_COMMUNITY): Payer: Medicaid Other | Admitting: Licensed Clinical Social Worker

## 2022-08-26 DIAGNOSIS — F1410 Cocaine abuse, uncomplicated: Secondary | ICD-10-CM

## 2022-08-26 NOTE — Group Note (Signed)
Group Topic: Stress Management  Group Date: 08/24/2022 Start Time:  9:00 AM End Time: 12:00 PM Facilitators: Cheri Fowler, [facility]  Department: [facility]  Number of Participants: 4  Group Focus: abuse issues, anger management, and substance abuse education Treatment Modality:  Cognitive Behavioral Therapy and Skills Training Interventions utilized were assignment, group exercise, and problem solving Purpose: explore maladaptive thinking and relapse prevention strategies  Name: Benjamin [name] Date of Birth: 1966-06-09  MR: [id number]    Level of Participation: active Quality of Participation: attention seeking and disruptive Interactions with others: gave feedback and monopolizing Mood/Affect: labile and positive Triggers (if applicable): [name] reports that he is feeling very triggered to use cocaine today due to being in physical pain. He reports that his body aches and his fingers are swollen. Cognition: logical and tangential Progress: Gaining insight Response: [name] is disruptive in group at times and has to be redirected back to topic being discussed. He maintains he has not used any cocaine although he has the urge to. He is engaged in group and offers feedback to peers. Plan: patient will be encouraged to identify triggers for use and practice adaptive coping skills.  Patients Problems:  Patient Active Problem List   Diagnosis Date Noted   Substance induced mood disorder (HCC) 07/18/2022   Atrial fibrillation with RVR (HCC) 01/08/2021   Acute respiratory failure (HCC) 12/24/2020   Obstructive sleep apnea treated with BiPAP 12/24/2020   Polysubstance use disorder 08/20/2020   Alcohol abuse 08/20/2020   Atrial fibrillation with rapid ventricular response (HCC) 08/19/2020   Obesity hypoventilation syndrome (HCC) 06/12/2020   Healthcare maintenance 06/12/2020   Acute on chronic diastolic heart failure (HCC) 02/24/2020   Diabetic neuropathy (HCC)  11/20/2019   Coagulation disorder (HCC) 11/20/2019   Acute on chronic diastolic CHF (congestive heart failure), NYHA class 3 (HCC) 07/30/2019   Diabetes mellitus without complication (HCC) 07/16/2019   Hypertensive emergency    BRBPR (bright red blood per rectum) 06/12/2019   Hypertension    CHF (congestive heart failure) (HCC)    Sleep apnea    Chronic respiratory failure (HCC)    Hypokalemia 05/29/2019   GI bleed 05/29/2019   Anemia 05/28/2019   Iron deficiency anemia 03/11/2019   OSA on CPAP 03/11/2019   HLD (hyperlipidemia) 03/11/2019   Elevated troponin 03/11/2019   GERD (gastroesophageal reflux disease) 03/11/2019   Rectal bleeding 02/20/2019   Dyspnea 02/06/2019   COVID-19 virus infection 02/06/2019   Bilateral lower extremity edema    Morbid obesity with BMI of 50.0-59.9, adult (HCC)    PAF (paroxysmal atrial fibrillation) (HCC)    PE (pulmonary thromboembolism) (HCC) 01/21/2019   Hypertensive urgency 01/21/2019   Diabetes mellitus type 2 in obese (HCC) 01/21/2019     Family Program: Family present? No   Name of family member(s): 0  UDS collected: No Results: pending  AA/NA attended?: No  Sponsor?: No

## 2022-08-29 ENCOUNTER — Encounter (HOSPITAL_COMMUNITY): Payer: Self-pay | Admitting: Psychiatry

## 2022-08-29 ENCOUNTER — Ambulatory Visit (HOSPITAL_COMMUNITY): Payer: Medicaid Other | Admitting: Licensed Clinical Social Worker

## 2022-08-29 DIAGNOSIS — F1410 Cocaine abuse, uncomplicated: Secondary | ICD-10-CM

## 2022-08-29 NOTE — Group Note (Signed)
Group Topic: Relapse Prevention  Group Date: 08/29/2022 Start Time:  9:00 AM End Time: 11:00 AM Facilitators: Cheri Fowler, [facility]  Department: [facility]  Number of Participants: 3  Group Focus: relapse prevention Treatment Modality:  Psychoeducation and Solution-Focused Therapy Interventions utilized were group exercise, patient education, and problem solving Purpose: enhance coping skills, explore maladaptive thinking, relapse prevention strategies, and trigger / craving management  Name: Benjamin [name] Date of Birth: 30-Apr-1966  MR: [id number]    Level of Participation: active Quality of Participation: attentive and cooperative Interactions with others: monopolizing and sarcastic Mood/Affect: brightens with interaction Triggers (if applicable): [name] does not report any triggers for use today. He reports that he has been feeling better. Cognition: coherent/clear and not focused Progress: Moderate Response: [name] is alert and oriented today in group. He is tangential in discussing other topics such as women and has to be redirected. He reported that his weekend was stable without any use. Clinician presented options for [name] to continue CD-IOP with new facilitator after current Clinician departure or to be referred to another treatment provider. Plan: patient will be encouraged to practice coping skills and setting healthy boundaries with himself and others in order to maintain sobriety.  Patients Problems:  Patient Active Problem List   Diagnosis Date Noted   Substance induced mood disorder (HCC) 07/18/2022   Atrial fibrillation with RVR (HCC) 01/08/2021   Acute respiratory failure (HCC) 12/24/2020   Obstructive sleep apnea treated with BiPAP 12/24/2020   Polysubstance use disorder 08/20/2020   Alcohol abuse 08/20/2020   Atrial fibrillation with rapid ventricular response (HCC) 08/19/2020   Obesity hypoventilation syndrome (HCC) 06/12/2020    Healthcare maintenance 06/12/2020   Acute on chronic diastolic heart failure (HCC) 02/24/2020   Diabetic neuropathy (HCC) 11/20/2019   Coagulation disorder (HCC) 11/20/2019   Acute on chronic diastolic CHF (congestive heart failure), NYHA class 3 (HCC) 07/30/2019   Diabetes mellitus without complication (HCC) 07/16/2019   Hypertensive emergency    BRBPR (bright red blood per rectum) 06/12/2019   Hypertension    CHF (congestive heart failure) (HCC)    Sleep apnea    Chronic respiratory failure (HCC)    Hypokalemia 05/29/2019   GI bleed 05/29/2019   Anemia 05/28/2019   Iron deficiency anemia 03/11/2019   OSA on CPAP 03/11/2019   HLD (hyperlipidemia) 03/11/2019   Elevated troponin 03/11/2019   GERD (gastroesophageal reflux disease) 03/11/2019   Rectal bleeding 02/20/2019   Dyspnea 02/06/2019   COVID-19 virus infection 02/06/2019   Bilateral lower extremity edema    Morbid obesity with BMI of 50.0-59.9, adult (HCC)    PAF (paroxysmal atrial fibrillation) (HCC)    PE (pulmonary thromboembolism) (HCC) 01/21/2019   Hypertensive urgency 01/21/2019   Diabetes mellitus type 2 in obese (HCC) 01/21/2019     Family Program: Family present? No   Name of family member(s): 0  UDS collected:  Collected on 12/08, results pending  Results: pending  AA/NA attended?: No  Sponsor?: No

## 2022-08-29 NOTE — Telephone Encounter (Signed)
H&V Care Navigation CSW Progress Note  Clinical Social Worker assisted pt with gift cards through [facility] program to help with outstanding utility bills.  Patient is participating in a Managed Medicaid Plan:  Yes  SDOH Screenings   Food Insecurity: No Food Insecurity (08/03/2021)  Housing: High Risk (02/23/2022)  Transportation Needs: Unmet Transportation Needs (08/15/2022)  Utilities: At Risk (07/14/2022)  Depression (PHQ2-9): High Risk (07/19/2022)  Financial Resource Strain: High Risk (08/25/2022)  Tobacco Use: Medium Risk (08/29/2022)   [name], LCSW Clinical Social Worker [facility] Desk#: [phone] Cell#: [phone]

## 2022-08-29 NOTE — Progress Notes (Signed)
Group Topic: Relapse Prevention  Group Date: 08/26/2022 Start Time: 0900 End Time: 1100 Facilitators: Cheri Fowler [facility]  Department: CD-IOP  No group context. This SmartLink only works when in group documentation mode. Name: Benjamin [name] Date of Birth: 1966-01-03  MR: [id number]    Level of Participation: active Quality of Participation: attentive and cooperative Interactions with others: gave feedback Mood/Affect: appropriate and brightens with interaction Triggers (if applicable): None identified Cognition: coherent/clear Progress: Gaining insight Response: [name] is alert and participates in group topic today. Plan: patient will be encouraged to maintain sobriety by practicing coping skills to better manage triggers and cravings for use.  Patients Problems:  Patient Active Problem List   Diagnosis Date Noted   Substance induced mood disorder (HCC) 07/18/2022   Atrial fibrillation with RVR (HCC) 01/08/2021   Acute respiratory failure (HCC) 12/24/2020   Obstructive sleep apnea treated with BiPAP 12/24/2020   Polysubstance use disorder 08/20/2020   Alcohol abuse 08/20/2020   Atrial fibrillation with rapid ventricular response (HCC) 08/19/2020   Obesity hypoventilation syndrome (HCC) 06/12/2020   Healthcare maintenance 06/12/2020   Acute on chronic diastolic heart failure (HCC) 02/24/2020   Diabetic neuropathy (HCC) 11/20/2019   Coagulation disorder (HCC) 11/20/2019   Acute on chronic diastolic CHF (congestive heart failure), NYHA class 3 (HCC) 07/30/2019   Diabetes mellitus without complication (HCC) 07/16/2019   Hypertensive emergency    BRBPR (bright red blood per rectum) 06/12/2019   Hypertension    CHF (congestive heart failure) (HCC)    Sleep apnea    Chronic respiratory failure (HCC)    Hypokalemia 05/29/2019   GI bleed 05/29/2019   Anemia 05/28/2019   Iron deficiency anemia 03/11/2019   OSA on CPAP 03/11/2019   HLD (hyperlipidemia) 03/11/2019   Elevated  troponin 03/11/2019   GERD (gastroesophageal reflux disease) 03/11/2019   Rectal bleeding 02/20/2019   Dyspnea 02/06/2019   COVID-19 virus infection 02/06/2019   Bilateral lower extremity edema    Morbid obesity with BMI of 50.0-59.9, adult (HCC)    PAF (paroxysmal atrial fibrillation) (HCC)    PE (pulmonary thromboembolism) (HCC) 01/21/2019   Hypertensive urgency 01/21/2019   Diabetes mellitus type 2 in obese (HCC) 01/21/2019     Family Program: Family present? No   Name of family member(s): 0  UDS collected: Yes Results: pending  AA/NA attended?: No  Sponsor?: No

## 2022-08-29 NOTE — Progress Notes (Signed)
Reviewed and agree with assessment/plan.   Coralyn Helling, MD [facility] Pulmonary/Critical Care 08/29/2022, 7:13 AM Pager:  [phone]

## 2022-08-30 ENCOUNTER — Telehealth: Payer: Self-pay | Admitting: Nurse Practitioner

## 2022-08-30 NOTE — Telephone Encounter (Signed)
Noted by clinical staff. Will have Katie sign order for non invasive vent. Will fax back to [employer] once it is signed. Nothing further needed

## 2022-08-31 ENCOUNTER — Ambulatory Visit (INDEPENDENT_AMBULATORY_CARE_PROVIDER_SITE_OTHER): Payer: Medicaid Other | Admitting: Licensed Clinical Social Worker

## 2022-08-31 DIAGNOSIS — F1410 Cocaine abuse, uncomplicated: Secondary | ICD-10-CM

## 2022-09-01 ENCOUNTER — Telehealth (HOSPITAL_COMMUNITY): Payer: Self-pay

## 2022-09-01 ENCOUNTER — Telehealth (HOSPITAL_COMMUNITY): Payer: Self-pay | Admitting: Licensed Clinical Social Worker

## 2022-09-01 DIAGNOSIS — J9611 Chronic respiratory failure with hypoxia: Secondary | ICD-10-CM | POA: Diagnosis not present

## 2022-09-01 DIAGNOSIS — I5032 Chronic diastolic (congestive) heart failure: Secondary | ICD-10-CM | POA: Diagnosis not present

## 2022-09-01 NOTE — Telephone Encounter (Signed)
Mr. Schadt is now discharged from paramedicine program. Rosetta Posner LCSW spoke to Mr. Garverick and made him aware. He will continue to be followed in the AHF clinic but no longer by paramedicine. Call complete.   Maralyn Sago, EMT-Paramedic [phone] 09/01/2022

## 2022-09-01 NOTE — Telephone Encounter (Signed)
  09/01/2022 Name: Benjamin [name] MRN: [id number] DOB: [birth year]  Benjamin [name] is a 56 y.o. year old male who is a current patient in CD-IOP program at [facility].   Clinician contacted [name], pt Fed. Probation officer to inform of Clinician leaving the office and inform about new facilitation that will be taking over the group. Clinician discussed [name]'s progress in treatment, urine drug screen results and provided alternative treatment options of [facility]. Mr. [name] informed Clinician that [name] is mandated by court order to attend treatment at [facility] office and would need a court order to begin with another treatment provider. Clinician forwarded email with probation contact information to Clinician that will be taking over group beginning week of January 3, [name].   Follow Up Plan:  No further follow up planned at this time. The patient has been provided with needed resources.  [name], [facility]

## 2022-09-02 ENCOUNTER — Ambulatory Visit (INDEPENDENT_AMBULATORY_CARE_PROVIDER_SITE_OTHER): Payer: Medicaid Other | Admitting: Licensed Clinical Social Worker

## 2022-09-02 ENCOUNTER — Ambulatory Visit (INDEPENDENT_AMBULATORY_CARE_PROVIDER_SITE_OTHER): Payer: Medicaid Other | Admitting: Psychiatry

## 2022-09-02 DIAGNOSIS — F1994 Other psychoactive substance use, unspecified with psychoactive substance-induced mood disorder: Secondary | ICD-10-CM | POA: Diagnosis not present

## 2022-09-02 DIAGNOSIS — F1410 Cocaine abuse, uncomplicated: Secondary | ICD-10-CM | POA: Diagnosis not present

## 2022-09-02 NOTE — Group Note (Signed)
Group Topic: Substance Abuse Treatment  Group Date: 08/31/2022 Start Time:  9:00 AM End Time: 12:00 PM Facilitators: Cheri Fowler, [facility]  Department: [facility]  Number of Participants: 2  Group Focus: substance abuse education Treatment Modality:  Cognitive Behavioral Therapy Interventions utilized were exploration, group exercise, and patient education Purpose: enhance coping skills, increase insight, and trigger / craving management  Name: Benjamin [name] Date of Birth: 28-Feb-1966  MR: [id number]    Level of Participation: active Quality of Participation: cooperative Interactions with others: gave feedback and monopolizing Mood/Affect: appropriate and positive Triggers (if applicable): [name] reports that he has been triggered to use because he does not want to miss group. Cognition: coherent/clear Progress: Gaining insight Response: [name] arrives to group at [employer]. He is reminded of start time for group and importance of being on time. He reports that he overslept. Clinician reminded [name] of continuing group and follow up after this Clinician leaves company and new facilitator begins running the group. [name] reports that he has not used any cocaine. He talks about women a lot and feeling like he has to help them out when in distress. Clinician challenged [name] to explore his boundaries around helping others. He denies SI/HI, AVH or psychosis. Plan: patient will be encouraged to practice healthy coping skills to manage cravings. Attend support group meetings.  Patients Problems:  Patient Active Problem List   Diagnosis Date Noted   Substance induced mood disorder (HCC) 07/18/2022   Atrial fibrillation with RVR (HCC) 01/08/2021   Acute respiratory failure (HCC) 12/24/2020   Obstructive sleep apnea treated with BiPAP 12/24/2020   Polysubstance use disorder 08/20/2020   Alcohol abuse 08/20/2020   Atrial fibrillation with rapid ventricular response  (HCC) 08/19/2020   Obesity hypoventilation syndrome (HCC) 06/12/2020   Healthcare maintenance 06/12/2020   Acute on chronic diastolic heart failure (HCC) 02/24/2020   Diabetic neuropathy (HCC) 11/20/2019   Coagulation disorder (HCC) 11/20/2019   Acute on chronic diastolic CHF (congestive heart failure), NYHA class 3 (HCC) 07/30/2019   Diabetes mellitus without complication (HCC) 07/16/2019   Hypertensive emergency    BRBPR (bright red blood per rectum) 06/12/2019   Hypertension    CHF (congestive heart failure) (HCC)    Sleep apnea    Chronic respiratory failure (HCC)    Hypokalemia 05/29/2019   GI bleed 05/29/2019   Anemia 05/28/2019   Iron deficiency anemia 03/11/2019   OSA on CPAP 03/11/2019   HLD (hyperlipidemia) 03/11/2019   Elevated troponin 03/11/2019   GERD (gastroesophageal reflux disease) 03/11/2019   Rectal bleeding 02/20/2019   Dyspnea 02/06/2019   COVID-19 virus infection 02/06/2019   Bilateral lower extremity edema    Morbid obesity with BMI of 50.0-59.9, adult (HCC)    PAF (paroxysmal atrial fibrillation) (HCC)    PE (pulmonary thromboembolism) (HCC) 01/21/2019   Hypertensive urgency 01/21/2019   Diabetes mellitus type 2 in obese (HCC) 01/21/2019     Family Program: Family present? No   Name of family member(s): 0  UDS collected: Yes Results: positive for cocaine, levels decreasing from previous screen. Last screen completed on 08/26/2022.  AA/NA attended?: No  Sponsor?: No

## 2022-09-02 NOTE — Progress Notes (Signed)
BH MD/PA/NP OP Progress Note  09/02/2022 11:08 PM Benjamin [name]  MRN:  [id number]  Chief Complaint:  Chief Complaint  Patient presents with   Follow-up   HPI: Patient is a 56 year old male with past psychiatric history of substance-induced mood disorder, cocaine abuse and medical history significant for atrial fibrillation, chronic CHF, history of PE, OSA, hypertension presented to [facility] outpatient  for med management follow up.   Pt reports that his mood is " better". He reports improvement in symptoms of depression and anxiety .He reports that he feels much more relaxed now. He has been sleeping and eating  better. He reports improvement in his irritability and anger. Currently, He denies any suicidal ideations, homicidal ideations, auditory and visual hallucinations.  He reports that he hears his mom's voice sometimes and is still grieving. Still reports some paranoia.  He reports lot of cravings and thoughts about using and not using cocaine. He is keeping himself busy by watching TV and isolates himself to avoid going out and using drugs. He denies any medication side effects and has been tolerating it well. He has reported his cardiologist about Zoloft who is ok with it.   He reports no change in his current stressors.  He has been coming for [facility] regularly and has not used cocaine for last 3 weeks.   Patient denies any need for change in medication and medication dosages and wants to continue same meds.  He denies any other concerns. Patient is alert and oriented x 4,  calm, cooperative, and fully engaged in conversation during the encounter.  His thought process is tangential with coherent speech . He does not appear to be responding to internal/external stimuli .    Visit Diagnosis:    ICD-10-CM   1. Substance induced mood disorder ([place])  F19.94       Past Psychiatric History: At initial eval-  Previous Psych Diagnoses: Substance-induced mood disorder, cocaine abuse Prior  inpatient treatment: 1 psychiatric inpatient admission when he was a child..  Multiple rehab admissions at different facilities Current meds: No psychiatric medications Psychotherapy hx: Currently getting therapy in [facility] Previous suicidal attempts: Denies Previous medication trials: Has tried 1 medication for depression which made him angry and agitated.  Does not remember its name Current therapist: None  Past Medical History:  Past Medical History:  Diagnosis Date   A-fib ([place])    Anemia 05/29/2019   CHF (congestive heart failure) (HCC)    Chronic respiratory failure (HCC)    Diabetes mellitus without complication ([place])    Dyspnea    Elevated troponin 02/06/2019   Hypertension    Obesity    Pulmonary embolism (HCC)    Rectal bleeding 05/29/2019   Sleep apnea     Past Surgical History:  Procedure Laterality Date   CARDIOVERSION N/A 08/21/2020   Procedure: CARDIOVERSION;  Surgeon: Larey Dresser, MD;  Location: [place];  Service: Cardiovascular;  Laterality: N/A;   COLONOSCOPY WITH PROPOFOL Left 02/25/2019   Procedure: COLONOSCOPY WITH PROPOFOL;  Surgeon: Arta Silence, MD;  Location: [place];  Service: Endoscopy;  Laterality: Left;   ESOPHAGOGASTRODUODENOSCOPY (EGD) WITH PROPOFOL Left 02/25/2019   Procedure: ESOPHAGOGASTRODUODENOSCOPY (EGD) WITH PROPOFOL;  Surgeon: Arta Silence, MD;  Location: [facility] ENDOSCOPY;  Service: Endoscopy;  Laterality: Left;   ESOPHAGOGASTRODUODENOSCOPY (EGD) WITH PROPOFOL N/A 06/12/2019   Procedure: ESOPHAGOGASTRODUODENOSCOPY (EGD) WITH PROPOFOL;  Surgeon: Clarene Essex, MD;  Location: [place];  Service: Gastroenterology;  Laterality: N/A;   FLEXIBLE SIGMOIDOSCOPY N/A 06/12/2019  Procedure: FLEXIBLE SIGMOIDOSCOPY;  Surgeon: Clarene Essex, MD;  Location: [place];  Service: Gastroenterology;  Laterality: N/A;   HEMORRHOID BANDING  05/2019   NO PAST SURGERIES     RIGHT HEART CATH N/A 04/17/2019   Procedure: RIGHT HEART CATH;  Surgeon:  Larey Dresser, MD;  Location: [place] CV [facility];  Service: Cardiovascular;  Laterality: N/A;   TEE WITHOUT CARDIOVERSION N/A 08/21/2020   Procedure: TRANSESOPHAGEAL ECHOCARDIOGRAM (TEE);  Surgeon: Larey Dresser, MD;  Location: [facility] ENDOSCOPY;  Service: Cardiovascular;  Laterality: N/A;    Family Psychiatric History: Denies  Family History:  Family History  Problem Relation Age of Onset   Hypertension Mother    Diabetes Mother     Social History:  Social History   Socioeconomic History   Marital status: Single    Spouse name: Not on file   Number of children: Not on file   Years of education: Not on file   Highest education level: Not on file  Occupational History   Not on file  Tobacco Use   Smoking status: Former   Smokeless tobacco: Never   Tobacco comments:    smoked weed in the past  Vaping Use   Vaping Use: Never used  Substance and Sexual Activity   Alcohol use: Not Currently   Drug use: Yes    Types: Marijuana, Cocaine   Sexual activity: Not Currently  Other Topics Concern   Not on file  Social History Narrative   Not on file   Social Determinants of Health   Financial Resource Strain: High Risk (08/25/2022)   Overall Financial Resource Strain (CARDIA)    Difficulty of Paying Living Expenses: Hard  Food Insecurity: No Food Insecurity (08/03/2021)   Hunger Vital Sign    Worried About Running Out of Food in the Last Year: Never true    Ran Out of Food in the Last Year: Never true  Transportation Needs: Unmet Transportation Needs (08/15/2022)   PRAPARE - Hydrologist (Medical): Yes    Lack of Transportation (Non-Medical): Yes  Physical Activity: Not on file  Stress: Not on file  Social Connections: Not on file    Allergies: No Known Allergies  Metabolic Disorder Labs: Lab Results  Component Value Date   HGBA1C 6.3 (H) 07/18/2022   MPG 134.11 07/18/2022   MPG 145.59 02/16/2022   Lab Results  Component Value Date    PROLACTIN 7.0 07/18/2022   Lab Results  Component Value Date   CHOL 101 07/18/2022   TRIG 36 07/18/2022   HDL 45 07/18/2022   CHOLHDL 2.2 07/18/2022   VLDL 7 07/18/2022   [place] 49 07/18/2022   [place] 65 02/16/2022   Lab Results  Component Value Date   TSH 1.648 07/18/2022   TSH 2.293 07/30/2019    Therapeutic Level Labs: No results found for: "LITHIUM" No results found for: "VALPROATE" No results found for: "CBMZ"  Current Medications: Current Outpatient Medications  Medication Sig Dispense Refill   albuterol (PROVENTIL) (2.5 MG/3ML) 0.083% nebulizer solution INHALE 1 VIAL BY NEBULIZATION EVERY 6 (SIX) HOURS AS NEEDED FOR WHEEZING OR SHORTNESS OF BREATH. 150 mL 1   atorvastatin (LIPITOR) 40 MG tablet Take 1 tablet (40 mg total) by mouth at bedtime. 90 tablet 3   Blood Glucose Monitoring Suppl (TRUE METRIX METER) w/Device KIT Use as instructed. Check blood glucose level by fingerstick twice per day. 1 kit 0   Blood Pressure Monitor DEVI Please provide patient with insurance approved  blood pressure device with L-XL cuff. BMI 55 1 each 0   diltiazem (CARDIZEM CD) 360 MG 24 hr capsule Take 1 capsule (360 mg total) by mouth daily. 90 capsule 3   empagliflozin (JARDIANCE) 10 MG TABS tablet Take 1 tablet (10 mg total) by mouth daily before breakfast. 30 tablet 6   FEROSUL 325 (65 Fe) MG tablet TAKE 1 TABLET (325 MG TOTAL) BY MOUTH DAILY AT NOON. 90 tablet 0   glucose blood (TRUE METRIX BLOOD GLUCOSE TEST) test strip Use as instructed. Check blood glucose level by fingerstick twice per day. 100 each 12   hydrALAZINE (APRESOLINE) 50 MG tablet TAKE 1 TABLET (50 MG TOTAL) BY MOUTH 3 (THREE) TIMES DAILY. (NOON,EVENING,BEDTIME) 180 tablet 3   Ipratropium-Albuterol (COMBIVENT RESPIMAT) 20-100 MCG/ACT AERS respimat Inhale 1 puff into the lungs every 6 (six) hours. 4 g 5   losartan (COZAAR) 50 MG tablet Take 1 tablet (50 mg total) by mouth daily. 90 tablet 3   magnesium oxide (MAG-OX) 400  (240 Mg) MG tablet TAKE 1 TABLET (400 MG TOTAL) BY MOUTH 2 (TWO) TIMES DAILY (NOON+BEDTIME) 90 tablet 3   metFORMIN (GLUCOPHAGE) 500 MG tablet TAKE 1 TABLET BY MOUTH 2 (TWO) TIMES DAILY WITH A MEAL (NOON+BEDTIME) 60 tablet 0   omeprazole (PRILOSEC) 20 MG capsule TAKE 1 CAPSULE 2 (TWO) TIMES DAILY BEFORE A MEAL. TAKE FIRST CAPSULE 30 MIN PRIOR TO EATING OR TAKING OTHER MEDICATIONS. (AM+BEDTIME) 180 capsule 1   potassium chloride (KLOR-CON) 10 MEQ tablet Take 8 tablets (80 mEq total) by mouth daily. 240 tablet 11   rivaroxaban (XARELTO) 20 MG TABS tablet Take 1 tablet (20 mg total) by mouth daily with supper. 90 tablet 3   sertraline (ZOLOFT) 25 MG tablet Take 1 tablet (25 mg total) by mouth daily. 30 tablet 1   spironolactone (ALDACTONE) 50 MG tablet Take 1 tablet (50 mg total) by mouth daily. 30 tablet 11   tamsulosin (FLOMAX) 0.4 MG CAPS capsule Take 0.4 mg by mouth daily after supper.     torsemide (DEMADEX) 20 MG tablet Take 3 tablets (60 mg total) by mouth every morning AND 2 tablets (40 mg total) every evening. 150 tablet 5   TRUEplus Lancets 28G MISC Use as instructed. Check blood glucose level by fingerstick twice per day. 200 each 3   No current facility-administered medications for this visit.     Musculoskeletal: Strength & Muscle Tone: within normal limits Gait & Station: normal Patient leans: N/A  Psychiatric Specialty Exam: Review of Systems  Blood pressure (!) 144/78, pulse 88, SpO2 98 %.There is no height or weight on file to calculate BMI.  General Appearance: Casual  Eye Contact:  Fair  Speech:  Normal Rate  Volume:  Normal  Mood:  Euthymic  Affect:  Full Range  Thought Process:  Descriptions of Associations: Tangential  Orientation:  Full (Time, Place, and Person)  Thought Content: Logical   Suicidal Thoughts:  No  Homicidal Thoughts:  No  Memory:  Immediate;   Good Recent;   Fair  Judgement:  Fair  Insight:  Fair  Psychomotor Activity:  Normal  Concentration:   Concentration: Good and Attention Span: Fair  Recall:  Good  Fund of Knowledge: Fair  Language: Good  Akathisia:  No  Handed:  Right  AIMS (if indicated): not done  Assets:  Communication Skills Desire for Improvement Housing  ADL's:  Intact  Cognition: WNL  Sleep:  Good   Screenings: GAD-7    Personnel officer  Visit from 03/30/2022 in [place] Office Visit from 12/21/2020 in [place] Office Visit from 02/18/2019 in [place]  Total GAD-7 Score 15 0 12      PHQ2-9    Flowsheet Row ED from 07/18/2022 in [facility] Office Visit from 03/30/2022 in [place] Office Visit from 12/21/2020 in [place] Office Visit from 04/30/2019 in [place] Office Visit from 02/18/2019 in [place]  PHQ-2 Total Score 4 2 0 5 2  PHQ-9 Total Score 16 8 0 20 7      [place] ED from 07/18/2022 in [facility] ED from 02/16/2022 in [place] ED from 07/20/2021 in [facility] [place] No Risk No Risk No Risk        Assessment and Plan: Patient is a 56 year old male with past psychiatric history of substance-induced mood disorder, cocaine abuse and medical history significant for atrial fibrillation, chronic CHF, history of PE, OSA, hypertension presented walk in to outpatient [facility] for depression .  Patient is reporting improvement in depression and anxiety.  He reports cravings.  He has been tolerating Zoloft well without any side effects.  He has been coming to [facility] IOP meetings regularly and has not used cocaine for last 3 weeks.  Will continue on same medications.  Substance-induced mood disorder Cocaine abuse - Zoloft 25 mg daily to help with depression  and anxiety.  -Recommend continued cocaine cessation -Continue [facility] IOP   OSA -Continue using CPAP machine at night   Medical issues and cardiac issues -Per primary and cardiology   Follow-up in 6 weeks Collaboration of Care: Other Dr Sande Rives    Patient/Guardian was advised Release of Information must be obtained prior to any record release in order to collaborate their care with an outside provider. Patient/Guardian was advised if they have not already done so to contact the registration department to sign all necessary forms in order for [place] to release information regarding their care.   Consent: Patient/Guardian gives verbal consent for treatment and assignment of benefits for services provided during this visit. Patient/Guardian expressed understanding and agreed to proceed.    Armando Reichert, MD 09/02/2022, 11:08 PM

## 2022-09-02 NOTE — Group Note (Signed)
Group Topic: Relapse Prevention  Group Date: 09/02/2022 Start Time:  9:00 AM End Time: 12:00 PM Facilitators: Cheri Fowler, [facility]  Department: [facility]  Number of Participants: 3  Group Focus: relapse prevention Treatment Modality:  Psychoeducation Interventions utilized were group exercise and leisure development Purpose: increase insight, regain self-worth, relapse prevention strategies, and trigger / craving management  Name: Benjamin [name] Date of Birth: Jun 18, 1966  MR: [id number]    Level of Participation: active Quality of Participation: attentive and cooperative Interactions with others: gave feedback Mood/Affect: appropriate Triggers (if applicable): No triggers identified today Cognition: coherent/clear Progress: Gaining insight Response: [place] is on time for group today. He is oriented to time, place and person. He reports that he was given a call by his probation officer and was advised to continue to attend treatment. He reports that he realizes how important this is for him to complete and he will continue to come when new facilitator resumes the group. Clinician provided Aydrien with contact information for new clinician and phone number for the office. [place] denies SI/HI, AVH or psychosis. Plan: patient will be encouraged to practice coping skills and maintain sobriety.  Patients Problems:  Patient Active Problem List   Diagnosis Date Noted   Substance induced mood disorder (HCC) 07/18/2022   Atrial fibrillation with RVR (HCC) 01/08/2021   Acute respiratory failure (HCC) 12/24/2020   Obstructive sleep apnea treated with BiPAP 12/24/2020   Polysubstance use disorder 08/20/2020   Alcohol abuse 08/20/2020   Atrial fibrillation with rapid ventricular response (HCC) 08/19/2020   Obesity hypoventilation syndrome (HCC) 06/12/2020   Healthcare maintenance 06/12/2020   Acute on chronic diastolic heart failure (HCC) 02/24/2020    Diabetic neuropathy (HCC) 11/20/2019   Coagulation disorder (HCC) 11/20/2019   Acute on chronic diastolic CHF (congestive heart failure), NYHA class 3 (HCC) 07/30/2019   Diabetes mellitus without complication (HCC) 07/16/2019   Hypertensive emergency    BRBPR (bright red blood per rectum) 06/12/2019   Hypertension    CHF (congestive heart failure) (HCC)    Sleep apnea    Chronic respiratory failure (HCC)    Hypokalemia 05/29/2019   GI bleed 05/29/2019   Anemia 05/28/2019   Iron deficiency anemia 03/11/2019   OSA on CPAP 03/11/2019   HLD (hyperlipidemia) 03/11/2019   Elevated troponin 03/11/2019   GERD (gastroesophageal reflux disease) 03/11/2019   Rectal bleeding 02/20/2019   Dyspnea 02/06/2019   COVID-19 virus infection 02/06/2019   Bilateral lower extremity edema    Morbid obesity with BMI of 50.0-59.9, adult (HCC)    PAF (paroxysmal atrial fibrillation) (HCC)    PE (pulmonary thromboembolism) (HCC) 01/21/2019   Hypertensive urgency 01/21/2019   Diabetes mellitus type 2 in obese (HCC) 01/21/2019     Family Program: Family present? No   Name of family member(s): 0  UDS collected: No Results:   AA/NA attended?: No  Sponsor?: No

## 2022-09-05 ENCOUNTER — Ambulatory Visit (HOSPITAL_COMMUNITY): Payer: Medicaid Other

## 2022-09-07 ENCOUNTER — Ambulatory Visit (HOSPITAL_COMMUNITY): Payer: Medicaid Other

## 2022-09-08 ENCOUNTER — Telehealth (HOSPITAL_COMMUNITY): Payer: Self-pay | Admitting: Licensed Clinical Social Worker

## 2022-09-08 NOTE — Telephone Encounter (Signed)
H&V Care Navigation CSW Progress Note  Clinical Social Worker received email from pt apartment complex stating they needed utility charge records for 2023 sent to them ASAP.  CSW called pt who has attempted to do this but has been unsuccessful- CSW helped pt regain access to onlilne accounts for [employer] and [employer] and downloaded and sent requested records to apartment site.  Patient is participating in a Managed Medicaid Plan:  Yes  SDOH Screenings   Food Insecurity: No Food Insecurity (08/03/2021)  Housing: High Risk (02/23/2022)  Transportation Needs: Unmet Transportation Needs (08/15/2022)  Utilities: At Risk (07/14/2022)  Depression (PHQ2-9): High Risk (07/19/2022)  Financial Resource Strain: High Risk (08/25/2022)  Tobacco Use: Medium Risk (08/29/2022)    [name], LCSW Clinical Social Worker [facility] Desk#: [phone] Cell#: [phone]

## 2022-09-09 ENCOUNTER — Ambulatory Visit (HOSPITAL_COMMUNITY): Payer: Medicaid Other

## 2022-09-14 ENCOUNTER — Ambulatory Visit (HOSPITAL_COMMUNITY): Payer: Medicaid Other

## 2022-09-15 ENCOUNTER — Other Ambulatory Visit: Payer: Self-pay | Admitting: Nurse Practitioner

## 2022-09-15 ENCOUNTER — Other Ambulatory Visit: Payer: Self-pay | Admitting: Family Medicine

## 2022-09-15 ENCOUNTER — Other Ambulatory Visit: Payer: Self-pay | Admitting: Pulmonary Disease

## 2022-09-15 DIAGNOSIS — R1084 Generalized abdominal pain: Secondary | ICD-10-CM

## 2022-09-16 ENCOUNTER — Ambulatory Visit (HOSPITAL_COMMUNITY): Payer: Medicaid Other

## 2022-09-21 ENCOUNTER — Telehealth (HOSPITAL_COMMUNITY): Payer: Self-pay | Admitting: Licensed Clinical Social Worker

## 2022-09-26 ENCOUNTER — Ambulatory Visit (HOSPITAL_COMMUNITY): Payer: Medicaid Other | Admitting: Licensed Clinical Social Worker

## 2022-09-26 ENCOUNTER — Encounter (HOSPITAL_COMMUNITY): Payer: Self-pay

## 2022-09-26 NOTE — Progress Notes (Signed)
Advanced Heart Failure Clinic Note   PCP: [name], NP Primary Cardiologist: Dorris Carnes, MD  HF Cardiologist: Dr. Aundra Dubin   HPI: Benjamin [name] is a 57 y.o. with hx of diastolic CHF, HTN, DM, Atrial fib, PE in April 2020 (on Xarelto), OSA, GERD, chronic elevation of troponin.    Admitted 01/21/2019 with increased dyspnea and had PE. He was anticoagulated.    Patient re-admitted 5/20 for syncope/cough/dyspnea, COVID+, CT positive for bilateral GGOs, admitted to [facility] and discharged after 6 days, on O2.  Rec'd Cards f/u.   Patient re-admitted 02/20/19 for bright red blood per rectum, eval'ed by GI, had EGD/colon, dx'd with hemorrhoidal bleeding, discharged after 9 days on O2. He was again Covid + .   Readmitted 03/11/19 with increased shortness of breath. He did not have home oxygen. He was discharged to home the next day.    He presented to [facility] ED 04/11/19 with lower extremity edema. COVID negative. Diuresed with lasix drip and transitioned to torsemide 40 mg twice daily. Had [place] with mild volume overload and preserved cardiac output. Discharge weight was 407.9 pounds.    He was readmitted with symptomatic anemia 05/28/19 with hgb 7.1. Received 1UPRBCs. GI consulted. He started on anusol and continued on stool softner.    Readmitted 66/06/30 with a/c diastolic CHF and treated w/ IV Lasix. Had 2 day hospital stay and was discharged on 10/29. Echo showed normal LVEF 65-70%, RV interpreted as normal but suspect significant RV dysfunction.  After diuresis w/ IV Lasix, he was placed back on home diuretic regimen, torsemide 100 mg bid. Lisinopril 5 mg was also added to regimen. AHF team was not consulted that admit.   He was seen back in clinic 07/30/19. Felt poorly. SOB at rest and worse w/ exertion. Also w/ orthopnea/PND. Significant wt gain of 20 lb up to 428 lb (dry wt ~407 lb), w/ marked abdominal distention and LEE and poor response to IV diuretics.  Once diuresed transitioned to torsemide 100  mg twice a day. D/C 408 pounds.   Zio patch on 10/20/19-No arrhythmia.   Re-admitted 08/19/20-08/24/20 for  A fib RVR. A. fib RVR in the setting of noncompliance ethanol and cocaine. Cardizem started and labetalol continued.  12/3 TEE (EF 60-65%) with DCCV successfully converted to SR. Diuresed with IV lasix and had AKI in setting of over diuresis. SCr 1.59 on admit, bumped to 2.5. Diuretics held + IVF hydration.   Admitted 12/24/20 with hypertensive emergency and respiratory failure. .   Admitted 4/22 with A Fib RVR & AMS. Diltiazem increased to 360 mg daily. Chemically converted to NSR.  + Cocaine.   Seen in ED 11/22 for COPD exacerbation.  Seen 11/02/21 and had been off all meds x 1 week, volume overloaded. Meds restarted and instructed to take metolazone 2.5 x 2 days w/ extra 40 kcl, however labs showed elevated BUN/SCr and left message to take metolazone x 1 day.  Re-enrolled in paramedicine.  Echo 5/23 EF 60-65%, moderate LVH, mid-cavity LV gradient to 29 mmHg, RV normal.   Today he returns for HF follow up. Weight is down 18 lbs.  He is using home oxygen and waiting to get his CPAP back. UDS was positive for cocaine in 10/23.  He says he has quit again. He is seeing a substance abuse counselor.  He is short of breath after walking 100 yards.  Breathing is "up and down," seems worse recently. Does ok walking around the house.  No chest pain.  ECG (personally reviewed): NSR, inferolateral TWIs  Labs (11/20): BNP 32, K 3.6, creatinine 1.09  Labs (12/20): K 3.3, creatinine 1.28 Labs (1/21): K 3.9 Creatinine 1.4  Labs (11/05/19) : K 3.2 Creatinine 1.47  Labs (4/21) : K 3.5 Creatinine 1.14  Labs (6/21): K 3.8 Creatinine 1.09.  Labs (12/21): K 4.0, Creatinine 1.17, Magnesium 1.6 Labs (12/21): K 3.1, Creatinine 2.54 Labs (1/22): K 3.1, creatinine 1.56 Labs (5/22): K 3.6, creatinine 1.89 Labs (1/23): K 3.9, creatinine 1.34 Labs (3/23): K 4.0, creatinine 1.29 => 1.77 Labs (5/23); K 4.3,  creatinine 1.47, LDL 65, HDL 44, normal LFTs Labs (14/78): K 3.6, creatinine 1.5, LDL 49  Review of Systems: All systems reviewed and negative except as per HPI.   PMH: 1. Atrial fibrillation: Paroxysmal.  - DCCV (08/21/20): converted to SR 2. Pulmonary embolus: 5/20.  3. OHS/OSA: He is on home oxygen during the day and uses CPAP at night.  4. Morbid obesity.  5. Chronic diastolic CHF:  - RHC (7/20): mean RA 12, PA 40/25 mean 31, mean PCWP 23, CI 2.47, PVR 1.1 WU.  - Echo (10/20): EF 65-70%, mild LVH, normal RV size and systolic function.  - TEE (12/21): EF 60-65% - Cardiolite in 12/21: fixed apical defect is likely artifact, no ischemia.  - Echo (5/23): EF 60-65%, moderate LVH, mid-cavity LV gradient to 29 mmHg, RV normal.  6. Type 2 diabetes 7. HTN 8. Rectal bleeding: ?Hemorrhoidal.  9. COVID-19 infection 7/20.  10. ZIo Patch - 10/20/19 no arrhythmias 11. CKD: Stage 3 12. Depression 13. Cocaine abuse  Current Outpatient Medications  Medication Sig Dispense Refill   albuterol (PROVENTIL) (2.5 MG/3ML) 0.083% nebulizer solution INHALE 1 VIAL BY NEBULIZATION EVERY 6 (SIX) HOURS AS NEEDED FOR WHEEZING OR SHORTNESS OF BREATH. 150 mL 1   atorvastatin (LIPITOR) 40 MG tablet Take 1 tablet (40 mg total) by mouth at bedtime. 90 tablet 3   Blood Glucose Monitoring Suppl (TRUE METRIX METER) w/Device KIT Use as instructed. Check blood glucose level by fingerstick twice per day. 1 kit 0   Blood Pressure Monitor DEVI Please provide patient with insurance approved blood pressure device with L-XL cuff. BMI 55 1 each 0   COMBIVENT RESPIMAT 20-100 MCG/ACT AERS respimat INHALE 1 PUFF INTO THE LUNGS EVERY 6 (SIX) HOURS. 4 g 5   diltiazem (CARDIZEM CD) 360 MG 24 hr capsule Take 1 capsule (360 mg total) by mouth daily. 90 capsule 3   empagliflozin (JARDIANCE) 10 MG TABS tablet Take 1 tablet (10 mg total) by mouth daily before breakfast. 30 tablet 6   ferrous sulfate (FEROSUL) 325 (65 FE) MG tablet TAKE 1  TABLET (325 MG TOTAL) BY MOUTH DAILY AT NOON. 30 tablet 0   glucose blood (TRUE METRIX BLOOD GLUCOSE TEST) test strip Use as instructed. Check blood glucose level by fingerstick twice per day. 100 each 12   hydrALAZINE (APRESOLINE) 50 MG tablet TAKE 1 TABLET (50 MG TOTAL) BY MOUTH 3 (THREE) TIMES DAILY. (NOON,EVENING,BEDTIME) 180 tablet 3   losartan (COZAAR) 50 MG tablet Take 1 tablet (50 mg total) by mouth daily. 90 tablet 3   magnesium oxide (MAG-OX) 400 (240 Mg) MG tablet TAKE 1 TABLET (400 MG TOTAL) BY MOUTH 2 (TWO) TIMES DAILY (NOON+BEDTIME) 90 tablet 3   metFORMIN (GLUCOPHAGE) 500 MG tablet TAKE 1 TABLET BY MOUTH 2 (TWO) TIMES DAILY WITH A MEAL (NOON+BEDTIME) 60 tablet 0   omeprazole (PRILOSEC) 20 MG capsule TAKE 1 CAPSULE 2 (TWO) TIMES DAILY BEFORE A MEAL.  TAKE FIRST CAPSULE 30 MIN PRIOR TO EATING OR TAKING OTHER MEDICATIONS. (AM+BEDTIME) 60 capsule 0   potassium chloride (KLOR-CON) 10 MEQ tablet Take 8 tablets (80 mEq total) by mouth daily. 240 tablet 11   rivaroxaban (XARELTO) 20 MG TABS tablet Take 1 tablet (20 mg total) by mouth daily with supper. 90 tablet 3   sertraline (ZOLOFT) 25 MG tablet Take 1 tablet (25 mg total) by mouth daily. 30 tablet 1   spironolactone (ALDACTONE) 50 MG tablet Take 1 tablet (50 mg total) by mouth daily. 30 tablet 11   tamsulosin (FLOMAX) 0.4 MG CAPS capsule Take 0.4 mg by mouth daily after supper.     torsemide (DEMADEX) 20 MG tablet Take 3 tablets (60 mg total) by mouth every morning AND 2 tablets (40 mg total) every evening. 150 tablet 5   TRUEplus Lancets 28G MISC Use as instructed. Check blood glucose level by fingerstick twice per day. 200 each 3   No current facility-administered medications for this visit.   No Known Allergies  Social History   Socioeconomic History   Marital status: Single    Spouse name: Not on file   Number of children: Not on file   Years of education: Not on file   Highest education level: Not on file  Occupational  History   Not on file  Tobacco Use   Smoking status: Former   Smokeless tobacco: Never   Tobacco comments:    smoked weed in the past  Vaping Use   Vaping Use: Never used  Substance and Sexual Activity   Alcohol use: Not Currently   Drug use: Yes    Types: Marijuana, Cocaine   Sexual activity: Not Currently  Other Topics Concern   Not on file  Social History Narrative   Not on file   Social Determinants of Health   Financial Resource Strain: High Risk (08/25/2022)   Overall Financial Resource Strain (CARDIA)    Difficulty of Paying Living Expenses: Hard  Food Insecurity: No Food Insecurity (08/03/2021)   Hunger Vital Sign    Worried About Running Out of Food in the Last Year: Never true    Ran Out of Food in the Last Year: Never true  Transportation Needs: Unmet Transportation Needs (08/15/2022)   PRAPARE - Hydrologist (Medical): Yes    Lack of Transportation (Non-Medical): Yes  Physical Activity: Not on file  Stress: Not on file  Social Connections: Not on file  Intimate Partner Violence: Not on file   Family History  Problem Relation Age of Onset   Hypertension Mother    Diabetes Mother    There were no vitals taken for this visit.  Wt Readings from Last 3 Encounters:  08/18/22 (!) 153.6 kg (338 lb 9.6 oz)  08/16/22 (!) 148.7 kg (327 lb 12.8 oz)  08/15/22 (!) 146.5 kg (323 lb)   PHYSICAL EXAM: General: NAD, obese.  Neck: No JVD, no thyromegaly or thyroid nodule.  Lungs: Clear to auscultation bilaterally with normal respiratory effort. CV: Nondisplaced PMI.  Heart regular S1/S2, no S3/S4, no murmur.  No peripheral edema.  No carotid bruit.  Normal pedal pulses.  Abdomen: Soft, nontender, no hepatosplenomegaly, no distention.  Skin: Intact without lesions or rashes.  Neurologic: Alert and oriented x 3.  Psych: Normal affect. Extremities: No clubbing or cyanosis.  HEENT: Normal.   ASSESSMENT & PLAN: 1. Chronic diastolic CHF:  Echo [id number] showed EF 60-65%, moderate LVH, mid-cavity LV gradient to 29 mmHg,  RV normal.  Suspect LVH is due to long-standing HTN.  NYHA class III symptoms, he does look mildly volume overloaded by exam today.   - Increase torsemide to 60 qam/40 qpm and continue KCl 80 daily. BMET/BNP today and BMET in 10 days.  - Continue Jardiance 10 mg daily. - Continue spironolactone 50 mg daily.  - Continue losartan 50 mg daily. - Continue hydralazine 50 mg tid - Cannot get Cardiomems as he has Medicaid.  2. Atrial fibrillation: Paroxysmal.  NSR today.   - Continue current dose of diltiazem.  - Continue Xarelto.  3. H/o PE: 5/20, continue Xarelto.  4. OHS/OSA: Uses O2 during the day and CPAP at night.  5. HTN: Controlled on current regimen.  6. DM: Per PCP. 7. Obesity: There is no height or weight on file to calculate BMI.  - He has diabetes, I will see if we can get semaglutide for him for weight loss.  8. Cocaine: Relapsed recently. Sees a Social worker.   Follow up in 6 wks with APP.   [place]  09/26/2022

## 2022-09-26 NOTE — Progress Notes (Signed)
Zymier presents for his scheduled meeting with this therapist. He was attending the CD IOP with Ms. [name] and was supposed to have been referred out to another provider when Ms. Morris left and the program was temporarily suspended; however, he showed unexpectedly for group on 09/23/21 so was asked to attend today to meet this therapist for a reassessment.   Darrious says that he cannot remember when he got out of prison saying that he thinks it was a few years ago and tells this therapist to call his P.O. and then begins attempt to do so himself but cannot get through to him. He says that when he was in the CD IOP with Ms. Morris that she gave him only one or two drug tests which both came up "dirty" saying that she did not give him any drug tests during the 8-9 days that he did not use which he says is his longest period of sobriety in his life.   He admits that he last used a couple days ago saying that when class "let out" that he has "no motivation." He tells this therapist that he is "looking at" him "as a positive source" noting that he has "positive vibes." When asked what he learned during his time in CD IOP, he responds by saying that he "didn't learn shit" and that it was "more of a comfort thing." The therapist notes that Rourke reportedly had difficulty staying awake in group which he attributes to his severe sleep apnea, watching too many videos in group, and Ms. Morris' not presenting material that could hold anyone's attention.   When asked about his past treatment, he says that he has been to "all of them" noting that he has been to [facility] and the [facility] and that "they were about collecting money. He says that [facility] would not accept him as he has a heart condition and that [facility] "ain't nothing but a rip off drug dealer." When asked what he found useful about this CD IOP, he says that he had a "sense of safeness" with the Police up the street knowing that no drugs  would be here such that he could "relax" and "focus."   The therapist inquires about hist past NA attendance with [facility] saying that he attended a couple while in the CD IOP and probably twenty meetings lifetime saying that he did not attend as he "didn't give a damn. He says that he was in [employer] prison for "guns and drugs" and currently lives alone in an apartment. There are no drugs where he lives but he can call to get someone to pick him up and take him to the "hood" to pick up drugs.   When asked about supports, he responds, "my family's dead" and that he has one sister who lives in [place] who is "disabled." Thus. He says that his only supports are his P.O. and his Child psychotherapist, Ms. Wendi Maya. He says that everybody that he knows are "whores, killers," and  "crooks."  In talking about his reasons for wanting to quit using, he tears up in saying that he is sick of fear and sick of getting high. When asked what he does to occupy his time when not using, he apparently spends his time watching TV but adds that he wants to go back to the gym.   He admits that he will use "any cop out" that he can get as an excuse to use noting,  "that's what the  devil tells me to do." The last grade that he completed was the 11th grade and he did not finish as he was sent to prison for selling drugs. He says that he first used cocaine at age 30 denying any alcohol or other drug use. He says that he tried smoking cocaine in the 80's but has snorted it since. He says that he once went to [facility] but that the Counselor was "full of shit" and would cut his nails during sessions, take phone calls, or get up to get coffee.   The therapist leaves briefly to print a PHQ-9 and GAD-7 for him to complete and upon returning, Hikeem has Mr. Tawni Carnes, his P.O. on his phone. Mr. Ilene Qua says that Goebel has been to and failed a number of inpatient treatment options including [facility] with this  being his 5th program. He says that he has a lot of collateral information from these admissions that he can share with this therapist. He says that one of the places assessed that Traye needs inpatient residential treatment; however, they could find none who would take him with his heart condition.   During this meeting, Luciano is alert and correctly oriented. His speech is extremely pressured and the content of his speech is extremely tangential such that it is difficult to obtain assessment information. Per his PHQ-9 and GAD-7, his mood is mildly depressed and severely anxious. He becomes tearful when he begins to think that he is going to be sent to prison but is able to be redirected.   The therapist explains to Kysean and his P.O. that he was supposed to have been referred out when the program was temporarily suspended and that if he was Court-ordered to this specific program that the Marveen Reeks should have been contacted to amend this order as Wataru was unable to complete the program due to circumstances out-of-his control. The therapist informs the P.O. that he will contact him tomorrow and informs Orlo that he will follow-up via phone with him as well.  The therapist notes that Jarrid says that talking to two other guys in the CD IOP by phone helped and explains how the NA program can provide these same supports encouraging him to attend 90 meetings in 90 days and to get a Sponsor giving a brief explanation of why this is important.   Adam Phenix, [place], LCSW, [facility], [place] 09/26/2022

## 2022-09-27 ENCOUNTER — Encounter (HOSPITAL_COMMUNITY): Payer: Self-pay

## 2022-09-27 ENCOUNTER — Telehealth (HOSPITAL_COMMUNITY): Payer: Self-pay | Admitting: Licensed Clinical Social Worker

## 2022-09-27 ENCOUNTER — Ambulatory Visit (HOSPITAL_COMMUNITY)
Admission: RE | Admit: 2022-09-27 | Discharge: 2022-09-27 | Disposition: A | Discharge status: Home / Self Care | Payer: Medicaid Other | Source: Ambulatory Visit | Attending: Family Medicine | Admitting: Family Medicine

## 2022-09-27 VITALS — BP 136/86 | HR 98 | Wt 334.0 lb

## 2022-09-27 DIAGNOSIS — E1122 Type 2 diabetes mellitus with diabetic chronic kidney disease: Secondary | ICD-10-CM | POA: Diagnosis not present

## 2022-09-27 DIAGNOSIS — I480 Paroxysmal atrial fibrillation: Secondary | ICD-10-CM | POA: Diagnosis not present

## 2022-09-27 DIAGNOSIS — I130 Hypertensive heart and chronic kidney disease with heart failure and stage 1 through stage 4 chronic kidney disease, or unspecified chronic kidney disease: Secondary | ICD-10-CM | POA: Diagnosis not present

## 2022-09-27 DIAGNOSIS — I5032 Chronic diastolic (congestive) heart failure: Secondary | ICD-10-CM | POA: Insufficient documentation

## 2022-09-27 DIAGNOSIS — I10 Essential (primary) hypertension: Secondary | ICD-10-CM | POA: Diagnosis not present

## 2022-09-27 DIAGNOSIS — Z6841 Body Mass Index (BMI) 40.0 and over, adult: Secondary | ICD-10-CM | POA: Diagnosis not present

## 2022-09-27 DIAGNOSIS — E118 Type 2 diabetes mellitus with unspecified complications: Secondary | ICD-10-CM | POA: Diagnosis not present

## 2022-09-27 DIAGNOSIS — K219 Gastro-esophageal reflux disease without esophagitis: Secondary | ICD-10-CM | POA: Insufficient documentation

## 2022-09-27 DIAGNOSIS — F1910 Other psychoactive substance abuse, uncomplicated: Secondary | ICD-10-CM

## 2022-09-27 DIAGNOSIS — Z86711 Personal history of pulmonary embolism: Secondary | ICD-10-CM | POA: Insufficient documentation

## 2022-09-27 DIAGNOSIS — G4733 Obstructive sleep apnea (adult) (pediatric): Secondary | ICD-10-CM | POA: Insufficient documentation

## 2022-09-27 DIAGNOSIS — Z7984 Long term (current) use of oral hypoglycemic drugs: Secondary | ICD-10-CM | POA: Insufficient documentation

## 2022-09-27 DIAGNOSIS — Z79899 Other long term (current) drug therapy: Secondary | ICD-10-CM | POA: Diagnosis not present

## 2022-09-27 DIAGNOSIS — Z7901 Long term (current) use of anticoagulants: Secondary | ICD-10-CM | POA: Insufficient documentation

## 2022-09-27 DIAGNOSIS — I2699 Other pulmonary embolism without acute cor pulmonale: Secondary | ICD-10-CM

## 2022-09-27 LAB — BASIC METABOLIC PANEL
Anion gap: 9 (ref 5–15)
BUN: 26 mg/dL — ABNORMAL HIGH (ref 6–20)
CO2: 29 mmol/L (ref 22–32)
Calcium: 9.1 mg/dL (ref 8.9–10.3)
Chloride: 96 mmol/L — ABNORMAL LOW (ref 98–111)
Creatinine, Ser: 1.73 mg/dL — ABNORMAL HIGH (ref 0.61–1.24)
GFR, Estimated: 46 mL/min — ABNORMAL LOW (ref >60)
Glucose, Bld: 95 mg/dL (ref 70–99)
Potassium: 3.8 mmol/L (ref 3.5–5.1)
Sodium: 134 mmol/L — ABNORMAL LOW (ref 135–145)

## 2022-09-27 LAB — BRAIN NATRIURETIC PEPTIDE: B Natriuretic Peptide: 4.4 pg/mL (ref 0.0–100.0)

## 2022-09-27 MED ORDER — TORSEMIDE 20 MG PO TABS: 60.0000 mg | ORAL_TABLET | Freq: Two times a day (BID) | ORAL | 5 refills | Status: DC

## 2022-09-27 NOTE — Progress Notes (Signed)
  Heart and Vascular Care Navigation  09/27/2022  Benjamin [name] 1966/05/17 [id number]  Reason for Referral: utility assistance/ transportation assistance Patient is participating in a Managed Medicaid Plan:Yes  Engaged with patient face to face for follow up visit for Heart and Vascular Care Coordination.                                                                                                   Assessment:    CSW met with pt and helped create an online account to apply for [employer] program to help with outstanding [place] bill- application completed.  CSW then went on pt modivcare account and helped set up rides for appts on 1/16 and 1/19- ride details provided to pt.                             HRT/VAS Care Coordination     Patients Home Cardiology Office Heart Failure Clinic   Outpatient Care Team Community Paramedicine   Community Paramedic Name: [facility] 09/01/22 as no longer needing services   Exercise Physiologist Name: Landis Martins [phone]   RN Care Manager Name: [facility] case worker- Baxter Flattery [phone]   Social Worker Name: [name] [phone]   Living arrangements for the past 2 months Single Family Home   Lives with: Significant Other   Patient Current Insurance Coverage Medicaid   Patient Has Concern With Paying Medical Bills No   Does Patient Have Prescription Coverage? Yes   Home Assistive Devices/Equipment CPAP; Oxygen   DME Agency [facility]   Current home services DME       Social History:                                                                             [place]: No Food Insecurity (08/03/2021)  Housing: High Risk (02/23/2022)  Transportation Needs: Unmet Transportation Needs (09/27/2022)  Utilities: Not At Risk (09/27/2022)  Recent Concern: Utilities - At Risk (07/14/2022)  Depression (PHQ2-9): Medium Risk (09/26/2022)  Financial Resource Strain: High Risk (08/25/2022)  Tobacco Use: Medium Risk (09/27/2022)    [name], LCSW Clinical Social Worker Advanced Heart Failure Clinic Desk#: [phone] Cell#: [phone]

## 2022-09-27 NOTE — Telephone Encounter (Signed)
The therapist calls Mr. Justine Benjamin [name], Advertising copywriter, concerning [place]. Mr. Ilene Qua says that he is meeting with someone else asking that this therapist calls him back in 30 minutes.  Adam Phenix, [place], LCSW, [facility], [place] 09/27/2022

## 2022-09-27 NOTE — Telephone Encounter (Signed)
Entered in error

## 2022-09-27 NOTE — Telephone Encounter (Signed)
The therapist calls Benjamin [name] asking if he can come in to see this therapist on 10/03/22 at 3 p.m. to which he answers in the affirmative.  Adam Phenix, [place], LCSW, [facility], [place] 09/27/2022

## 2022-09-27 NOTE — Telephone Encounter (Signed)
H&V Care Navigation CSW Progress Note  Clinical Social Worker helped to arrange transport to clinic appt on 1/9 through [employer].  Pt will be picked up 12:45pm Pick up from appt scheduled for 2:30pm  Patient is participating in a Managed Medicaid Plan:  Yes  [place]: No Food Insecurity (08/03/2021)  Housing: High Risk (02/23/2022)  Transportation Needs: Unmet Transportation Needs (09/27/2022)  Utilities: At Risk (07/14/2022)  Depression (PHQ2-9): Medium Risk (09/26/2022)  Financial Resource Strain: High Risk (08/25/2022)  Tobacco Use: Medium Risk (08/29/2022)    [name], LCSW Clinical Social Worker Advanced Heart Failure Clinic Desk#: [phone] Cell#: [phone]
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
## 2022-09-27 NOTE — Patient Instructions (Signed)
INCREASE Torsemide to 60 mg (3 tabs) twice a day  Labs today We will only contact you if something comes back abnormal or we need to make some changes. Otherwise no news is good news!  Labs needed in 2 weeks  You have been referred to [facility]-Pharmacy team to discuss Semaglutide -they will be in touch with an appointment  Your physician recommends that you schedule a follow-up appointment in: 2 months  in the Advanced Practitioners (PA/NP) Clinic and in 6 months with Dr Aundra Dubin  Do the following things EVERYDAY: Weigh yourself in the morning before breakfast. Write it down and keep it in a log. Take your medicines as prescribed Eat low salt foods--Limit salt (sodium) to 2000 mg per day.  Stay as active as you can everyday Limit all fluids for the day to less than 2 liters  At the [facility], you and your health needs are our priority. As part of our continuing mission to provide you with exceptional heart care, we have created designated Provider Care Teams. These Care Teams include your primary Cardiologist (physician) and Advanced Practice Providers (APPs- Physician Assistants and Nurse Practitioners) who all work together to provide you with the care you need, when you need it.   You may see any of the following providers on your designated Care Team at your next follow up: Dr Glori Bickers Dr Loralie Champagne Dr. Roxana Hires, NP Lyda Jester, [place] [facility] [place], [place] Forestine Na, NP Audry Riles, PharmD   Please be sure to bring in all your medications bottles to every appointment.   If you have any questions or concerns before your next appointment please send [place] a message through [place] or call our office at [phone].    TO LEAVE A MESSAGE FOR THE NURSE SELECT OPTION 2, PLEASE LEAVE A MESSAGE INCLUDING: YOUR NAME DATE OF BIRTH CALL BACK NUMBER REASON FOR CALL**this is important as we prioritize the call backs  YOU  WILL RECEIVE A CALL BACK THE SAME DAY AS LONG AS YOU CALL BEFORE 4:00 PM

## 2022-09-27 NOTE — Addendum Note (Signed)
Encounter addended by: [name], LCSW on: 09/27/2022 3:44 PM  Actions taken: Flowsheet accepted, Clinical Note Signed

## 2022-10-02 DIAGNOSIS — I5032 Chronic diastolic (congestive) heart failure: Secondary | ICD-10-CM | POA: Diagnosis not present

## 2022-10-02 DIAGNOSIS — J9611 Chronic respiratory failure with hypoxia: Secondary | ICD-10-CM | POA: Diagnosis not present

## 2022-10-04 ENCOUNTER — Encounter: Payer: Self-pay | Admitting: Nurse Practitioner

## 2022-10-04 ENCOUNTER — Ambulatory Visit: Payer: Medicaid Other | Attending: Nurse Practitioner | Admitting: Nurse Practitioner

## 2022-10-04 VITALS — BP 115/66 | HR 100 | Ht 71.0 in | Wt 331.4 lb

## 2022-10-04 DIAGNOSIS — E1165 Type 2 diabetes mellitus with hyperglycemia: Secondary | ICD-10-CM | POA: Diagnosis not present

## 2022-10-04 DIAGNOSIS — I10 Essential (primary) hypertension: Secondary | ICD-10-CM

## 2022-10-04 DIAGNOSIS — Z7984 Long term (current) use of oral hypoglycemic drugs: Secondary | ICD-10-CM

## 2022-10-04 NOTE — Patient Instructions (Addendum)
[place] at [facility] Address: [address], [place], [place] [address] Hours:  Open ? Closes 5?PM Phone: [phone]     [place] at [facility] [address] [place],  [place]  [id number] Get Driving Directions Main: [phone]

## 2022-10-04 NOTE — Progress Notes (Signed)
Assessment & Plan:  Benjamin [name] was seen today for hypertension.  Diagnoses and all orders for this visit:  Primary hypertension Continue all antihypertensives as prescribed.  Reminded to bring in blood pressure log for follow  up appointment.  RECOMMENDATIONS: DASH/Mediterranean Diets are healthier choices for HTN.    Type 2 diabetes mellitus with hyperglycemia, without long-term current use of insulin  Well controlled.  Continue blood sugar control as discussed in office today, low carbohydrate diet, and regular physical exercise as tolerated, 150 minutes per week (30 min each day, 5 days per week, or 50 min 3 days per week). Keep blood sugar logs with fasting goal of 90-130 mg/dl, post prandial (after you eat) less than 180.  For Hypoglycemia: BS <60 and Hyperglycemia BS >400; contact the clinic ASAP. Annual eye exams and foot exams are recommended.     Patient has been counseled on age-appropriate routine health concerns for screening and prevention. These are reviewed and up-to-date. Referrals have been placed accordingly. Immunizations are up-to-date or declined.    Subjective:   Chief Complaint  Patient presents with   Hypertension   HPI Benjamin [name] 57 y.o. male presents to office today for follow up to HTN   He has a PMH  of chronic diastolic CHF (EF 66-063) , HTN, DM, Atrial fib, PE in April 2020 (on Xarelto), OSA, GERD, GI bleed due to hemorrhoids, Anemia, chronic elevation of troponin, OHS/OSA: chronic O2 2L during day and BIPAP at night,    He is wearing his o2 today (2L) and reports compliance with BIPAP at night    HTN Blood pressure well controlled. Current meds include Cardizem 360 mg daily, hydralazine 50 mg 3 times daily, losartan 50 mg daily, spironolactone 50 mg daily and torsemide 60 mg twice daily BP Readings from Last 3 Encounters:  10/04/22 115/66  09/27/22 136/86  08/18/22 (!) 140/82     DM 2 Diabetes is well controlled with metformin 500 mg  twice daily and Jardiance 10 mg daily.  He had a recent appointment with podiatry for diabetic shoes on 02-04-2022.  Prescription for diabetic shoes as well as ankle brace were provided to the patient.  [facility]  Lab Results  Component Value Date   HGBA1C 6.3 (H) 07/18/2022  LDL at goal with atorvastatin 40 mg daily  Lab Results  Component Value Date   LDLCALC 49 07/18/2022     Review of Systems  Constitutional:  Negative for fever, malaise/fatigue and weight loss.  HENT: Negative.  Negative for nosebleeds.   Eyes: Negative.  Negative for blurred vision, double vision and photophobia.  Respiratory:  Positive for shortness of breath (chronic and at baseline). Negative for cough.        CHRONIC O2  Cardiovascular: Negative.  Negative for chest pain, palpitations and leg swelling.  Gastrointestinal: Negative.  Negative for heartburn, nausea and vomiting.  Musculoskeletal: Negative.  Negative for myalgias.  Neurological: Negative.  Negative for dizziness, focal weakness, seizures and headaches.  Psychiatric/Behavioral: Negative.  Negative for suicidal ideas.     Past Medical History:  Diagnosis Date   A-fib ([place])    Anemia 05/29/2019   CHF (congestive heart failure) (HCC)    Chronic respiratory failure (HCC)    Diabetes mellitus without complication (HCC)    Dyspnea    Elevated troponin 02/06/2019   Hypertension    Obesity    Pulmonary embolism (HCC)    Rectal bleeding 05/29/2019   Sleep apnea  Past Surgical History:  Procedure Laterality Date   CARDIOVERSION N/A 08/21/2020   Procedure: CARDIOVERSION;  Surgeon: Larey Dresser, MD;  [facility] ENDOSCOPY;  Service: Cardiovascular;  Laterality: N/A;   COLONOSCOPY WITH PROPOFOL Left 02/25/2019   Procedure: COLONOSCOPY WITH PROPOFOL;  Surgeon: Arta Silence, MD;  Location: [place];  Service: Endoscopy;  Laterality: Left;   ESOPHAGOGASTRODUODENOSCOPY (EGD) WITH PROPOFOL Left 02/25/2019    Procedure: ESOPHAGOGASTRODUODENOSCOPY (EGD) WITH PROPOFOL;  Surgeon: Arta Silence, MD;  Location: [facility] ENDOSCOPY;  Service: Endoscopy;  Laterality: Left;   ESOPHAGOGASTRODUODENOSCOPY (EGD) WITH PROPOFOL N/A 06/12/2019   Procedure: ESOPHAGOGASTRODUODENOSCOPY (EGD) WITH PROPOFOL;  Surgeon: Clarene Essex, MD;  Location: [place];  Service: Gastroenterology;  Laterality: N/A;   FLEXIBLE SIGMOIDOSCOPY N/A 06/12/2019   Procedure: FLEXIBLE SIGMOIDOSCOPY;  Surgeon: Clarene Essex, MD;  Location: [place];  Service: Gastroenterology;  Laterality: N/A;   HEMORRHOID BANDING  05/2019   NO PAST SURGERIES     RIGHT HEART CATH N/A 04/17/2019   Procedure: RIGHT HEART CATH;  Surgeon: Larey Dresser, MD;  Location: [place] CV LAB;  Service: Cardiovascular;  Laterality: N/A;   TEE WITHOUT CARDIOVERSION N/A 08/21/2020   Procedure: TRANSESOPHAGEAL ECHOCARDIOGRAM (TEE);  Surgeon: Larey Dresser, MD;  Location: [facility] ENDOSCOPY;  Service: Cardiovascular;  Laterality: N/A;    Family History  Problem Relation Age of Onset   Hypertension Mother    Diabetes Mother     Social History Reviewed with no changes to be made today.   Outpatient Medications Prior to Visit  Medication Sig Dispense Refill   albuterol (PROVENTIL) (2.5 MG/3ML) 0.083% nebulizer solution INHALE 1 VIAL BY NEBULIZATION EVERY 6 (SIX) HOURS AS NEEDED FOR WHEEZING OR SHORTNESS OF BREATH. 150 mL 1   atorvastatin (LIPITOR) 40 MG tablet Take 1 tablet (40 mg total) by mouth at bedtime. 90 tablet 3   Blood Glucose Monitoring Suppl (TRUE METRIX METER) w/Device KIT Use as instructed. Check blood glucose level by fingerstick twice per day. 1 kit 0   Blood Pressure Monitor DEVI Please provide patient with insurance approved blood pressure device with L-XL cuff. BMI 55 1 each 0   COMBIVENT RESPIMAT 20-100 MCG/ACT AERS respimat INHALE 1 PUFF INTO THE LUNGS EVERY 6 (SIX) HOURS. 4 g 5   diltiazem (CARDIZEM CD) 360 MG 24 hr capsule Take 1 capsule (360 mg  total) by mouth daily. 90 capsule 3   empagliflozin (JARDIANCE) 10 MG TABS tablet Take 1 tablet (10 mg total) by mouth daily before breakfast. 30 tablet 6   ferrous sulfate (FEROSUL) 325 (65 FE) MG tablet TAKE 1 TABLET (325 MG TOTAL) BY MOUTH DAILY AT NOON. 30 tablet 0   glucose blood (TRUE METRIX BLOOD GLUCOSE TEST) test strip Use as instructed. Check blood glucose level by fingerstick twice per day. 100 each 12   hydrALAZINE (APRESOLINE) 50 MG tablet TAKE 1 TABLET (50 MG TOTAL) BY MOUTH 3 (THREE) TIMES DAILY. (NOON,EVENING,BEDTIME) 180 tablet 3   losartan (COZAAR) 50 MG tablet Take 1 tablet (50 mg total) by mouth daily. 90 tablet 3   magnesium oxide (MAG-OX) 400 (240 Mg) MG tablet TAKE 1 TABLET (400 MG TOTAL) BY MOUTH 2 (TWO) TIMES DAILY (NOON+BEDTIME) 90 tablet 3   metFORMIN (GLUCOPHAGE) 500 MG tablet TAKE 1 TABLET BY MOUTH 2 (TWO) TIMES DAILY WITH A MEAL (NOON+BEDTIME) 60 tablet 0   omeprazole (PRILOSEC) 20 MG capsule TAKE 1 CAPSULE 2 (TWO) TIMES DAILY BEFORE A MEAL. TAKE FIRST CAPSULE 30 MIN PRIOR TO EATING OR TAKING OTHER MEDICATIONS. (  AM+BEDTIME) 60 capsule 0   potassium chloride (KLOR-CON) 10 MEQ tablet Take 8 tablets (80 mEq total) by mouth daily. 240 tablet 11   rivaroxaban (XARELTO) 20 MG TABS tablet Take 1 tablet (20 mg total) by mouth daily with supper. 90 tablet 3   sertraline (ZOLOFT) 25 MG tablet Take 1 tablet (25 mg total) by mouth daily. 30 tablet 1   spironolactone (ALDACTONE) 50 MG tablet Take 1 tablet (50 mg total) by mouth daily. 30 tablet 11   tamsulosin (FLOMAX) 0.4 MG CAPS capsule Take 0.4 mg by mouth daily after supper.     torsemide (DEMADEX) 20 MG tablet Take 3 tablets (60 mg total) by mouth 2 (two) times daily. 180 tablet 5   TRUEplus Lancets 28G MISC Use as instructed. Check blood glucose level by fingerstick twice per day. 200 each 3   No facility-administered medications prior to visit.    No Known Allergies     Objective:    BP 115/66   Pulse 100   Ht 5'  11" (1.803 m)   Wt (!) 331 lb 6.4 oz (150.3 kg)   SpO2 96%   BMI 46.22 kg/m  Wt Readings from Last 3 Encounters:  10/04/22 (!) 331 lb 6.4 oz (150.3 kg)  09/27/22 (!) 334 lb (151.5 kg)  08/18/22 (!) 338 lb 9.6 oz (153.6 kg)    Physical Exam Vitals and nursing note reviewed.  Constitutional:      Appearance: He is well-developed.  HENT:     Head: Normocephalic and atraumatic.  Cardiovascular:     Rate and Rhythm: Regular rhythm. Tachycardia present.     Heart sounds: Normal heart sounds. No murmur heard.    No friction rub. No gallop.  Pulmonary:     Effort: Pulmonary effort is normal. No tachypnea or respiratory distress.     Breath sounds: Normal breath sounds. No decreased breath sounds, wheezing, rhonchi or rales.  Chest:     Chest wall: No tenderness.  Abdominal:     General: Bowel sounds are normal.     Palpations: Abdomen is soft.  Musculoskeletal:        General: Normal range of motion.     Cervical back: Normal range of motion.  Skin:    General: Skin is warm and dry.  Neurological:     Mental Status: He is alert and oriented to person, place, and time.     Coordination: Coordination normal.  Psychiatric:        Behavior: Behavior normal. Behavior is cooperative.        Thought Content: Thought content normal.        Judgment: Judgment normal.          Patient has been counseled extensively about nutrition and exercise as well as the importance of adherence with medications and regular follow-up. The patient was given clear instructions to go to ER or return to medical center if symptoms don't improve, worsen or new problems develop. The patient verbalized understanding.   Follow-up: Return in about 15 weeks (around 01/17/2023) for a1c.   Claiborne Rigg, FNP-BC [facility] [place], [place] [phone]   10/04/2022, 3:38 PM

## 2022-10-07 ENCOUNTER — Ambulatory Visit (HOSPITAL_BASED_OUTPATIENT_CLINIC_OR_DEPARTMENT_OTHER): Payer: Medicaid Other | Admitting: Pulmonary Disease

## 2022-10-10 ENCOUNTER — Ambulatory Visit (HOSPITAL_COMMUNITY): Payer: Medicaid Other | Admitting: Licensed Clinical Social Worker

## 2022-10-12 ENCOUNTER — Encounter (HOSPITAL_COMMUNITY): Payer: Self-pay | Admitting: Licensed Clinical Social Worker

## 2022-10-14 ENCOUNTER — Encounter (HOSPITAL_COMMUNITY): Payer: Medicaid Other | Admitting: Psychiatry

## 2022-10-14 ENCOUNTER — Telehealth (HOSPITAL_COMMUNITY): Payer: Self-pay | Admitting: Licensed Clinical Social Worker

## 2022-10-14 NOTE — Telephone Encounter (Signed)
The therapist calls Fortunato verifying his identity via two identifiers. He makes Fredric aware that he missed his appointment with this therapist this Monday, 10/10/22.  Gildo had to cancel his psychiatry appointment today due to the flu. The therapist schedules Heron to see him on 10/20/22 at 10 a.m. at the [facility] office.  Adam Phenix, [place], LCSW, [facility], [place] 10/14/2022

## 2022-10-18 ENCOUNTER — Other Ambulatory Visit (HOSPITAL_COMMUNITY): Payer: Self-pay | Admitting: Family Medicine

## 2022-10-18 DIAGNOSIS — E876 Hypokalemia: Secondary | ICD-10-CM

## 2022-10-18 DIAGNOSIS — I10 Essential (primary) hypertension: Secondary | ICD-10-CM

## 2022-10-20 ENCOUNTER — Telehealth (HOSPITAL_COMMUNITY): Payer: Self-pay | Admitting: Licensed Clinical Social Worker

## 2022-10-20 ENCOUNTER — Ambulatory Visit (INDEPENDENT_AMBULATORY_CARE_PROVIDER_SITE_OTHER): Payer: Medicaid Other | Admitting: Licensed Clinical Social Worker

## 2022-10-20 DIAGNOSIS — F1420 Cocaine dependence, uncomplicated: Secondary | ICD-10-CM
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
## 2022-10-27 ENCOUNTER — Telehealth (HOSPITAL_COMMUNITY): Payer: Self-pay | Admitting: Licensed Clinical Social Worker

## 2022-10-27 NOTE — Telephone Encounter (Signed)
The therapist returns Benjamin [name]'s call confirming his identity via two identifiers. He says that the "old Case Manager" never returned his call so Benjamin [name] is getting him a new Tourist information centre manager who is to call him today. The new Case Manager is Clinical biochemist.   He says that he has attended four NA meetings saying that he last attended last Friday but plans on going to another this afternoon. Benjamin [name] says that he last used 8 or 9 days ago using powder cocaine.   The therapist recommends that Benjamin [name] get a temporary Sponsor as soon as possible.   Adam Phenix, [place], LCSW, [facility], [place] 10/27/2022

## 2022-11-08 ENCOUNTER — Ambulatory Visit: Payer: Medicaid Other

## 2022-11-08 ENCOUNTER — Ambulatory Visit: Payer: Medicaid Other | Admitting: Podiatry

## 2022-11-08 NOTE — Progress Notes (Deleted)
Patient ID: Benjamin [name]                 DOB: [birth year]                    MRN: [id number]     HPI: Benjamin [name] is a 57 y.o. male patient referred to pharmacy clinic by *** to initiate weight loss therapy with GLP1-RA. PMH is significant for obesity, diastolic CHF, HTN, DM, Atrial fib, PE in April 2020 (on Xarelto), OSA, GERD, chronic elevation of troponin. . Most recent BMI ***.  Baseline weight and BMI: *** Current weight and BMI: *** Current meds that affect weight: ****  *** If diabetic and on insulin/sulfonylurea, can consider reducing dose to reduce risk of hypoglycemia  *** Follow-up visit  Assess % weight loss Assess adverse effects Missed doses  Diet:   Exercise:   Family History:   Social History:   Labs: Lab Results  Component Value Date   HGBA1C 6.3 (H) 07/18/2022    Wt Readings from Last 1 Encounters:  10/04/22 (!) 331 lb 6.4 oz (150.3 kg)    BP Readings from Last 1 Encounters:  10/04/22 115/66   Pulse Readings from Last 1 Encounters:  10/04/22 100       Component Value Date/Time   CHOL 101 07/18/2022 1045   CHOL 174 01/17/2020 1135   TRIG 36 07/18/2022 1045   HDL 45 07/18/2022 1045   HDL 47 01/17/2020 1135   CHOLHDL 2.2 07/18/2022 1045   VLDL 7 07/18/2022 1045   LDLCALC 49 07/18/2022 1045   [place] 110 (H) 01/17/2020 1135    Past Medical History:  Diagnosis Date   A-fib ([place])    Anemia 05/29/2019   CHF (congestive heart failure) (HCC)    Chronic respiratory failure (HCC)    Diabetes mellitus without complication (HCC)    Dyspnea    Elevated troponin 02/06/2019   Hypertension    Obesity    Pulmonary embolism (HCC)    Rectal bleeding 05/29/2019   Sleep apnea     Current Outpatient Medications on File Prior to Visit  Medication Sig Dispense Refill   albuterol (PROVENTIL) (2.5 MG/3ML) 0.083% nebulizer solution INHALE 1 VIAL BY NEBULIZATION EVERY 6 (SIX) HOURS AS NEEDED FOR WHEEZING OR SHORTNESS OF BREATH. 150 mL 1    atorvastatin (LIPITOR) 40 MG tablet Take 1 tablet (40 mg total) by mouth at bedtime. 90 tablet 3   Blood Glucose Monitoring Suppl (TRUE METRIX METER) w/Device KIT Use as instructed. Check blood glucose level by fingerstick twice per day. 1 kit 0   Blood Pressure Monitor DEVI Please provide patient with insurance approved blood pressure device with L-XL cuff. BMI 55 1 each 0   COMBIVENT RESPIMAT 20-100 MCG/ACT AERS respimat INHALE 1 PUFF INTO THE LUNGS EVERY 6 (SIX) HOURS. 4 g 5   diltiazem (CARDIZEM CD) 360 MG 24 hr capsule TAKE 1 CAPSULE (360 MG TOTAL) BY MOUTH DAILY. (NOON) 90 capsule 3   ferrous sulfate (FEROSUL) 325 (65 FE) MG tablet TAKE 1 TABLET (325 MG TOTAL) BY MOUTH DAILY AT NOON. 30 tablet 0   glucose blood (TRUE METRIX BLOOD GLUCOSE TEST) test strip Use as instructed. Check blood glucose level by fingerstick twice per day. 100 each 12   hydrALAZINE (APRESOLINE) 50 MG tablet TAKE 1 TABLET (50 MG TOTAL) BY MOUTH 3 (THREE) TIMES DAILY. (NOON,EVENING,BEDTIME) 180 tablet 3   JARDIANCE 10 MG TABS tablet TAKE 1 TABLET (10 MG TOTAL) BY MOUTH  DAILY BEFORE BREAKFAST (AM) 30 tablet 6   losartan (COZAAR) 50 MG tablet TAKE 1 TABLET (50 MG TOTAL) BY MOUTH DAILY. (NOON) 90 tablet 3   magnesium oxide (MAG-OX) 400 (240 Mg) MG tablet TAKE 1 TABLET (400 MG TOTAL) BY MOUTH 2 (TWO) TIMES DAILY (NOON+BEDTIME) 90 tablet 3   metFORMIN (GLUCOPHAGE) 500 MG tablet TAKE 1 TABLET BY MOUTH 2 (TWO) TIMES DAILY WITH A MEAL (NOON+BEDTIME) 60 tablet 0   omeprazole (PRILOSEC) 20 MG capsule TAKE 1 CAPSULE 2 (TWO) TIMES DAILY BEFORE A MEAL. TAKE FIRST CAPSULE 30 MIN PRIOR TO EATING OR TAKING OTHER MEDICATIONS. (AM+BEDTIME) 60 capsule 0   potassium chloride (KLOR-CON) 10 MEQ tablet TAKE 8 TABLETS (80 MEQ TOTAL) BY MOUTH DAILY. (4 NOON+4 EVENING) 240 tablet 11   sertraline (ZOLOFT) 25 MG tablet Take 1 tablet (25 mg total) by mouth daily. 30 tablet 1   spironolactone (ALDACTONE) 50 MG tablet TAKE 1 TABLET (50 MG TOTAL) BY MOUTH  DAILY. (NOON) 30 tablet 11   tamsulosin (FLOMAX) 0.4 MG CAPS capsule Take 0.4 mg by mouth daily after supper.     torsemide (DEMADEX) 20 MG tablet Take 3 tablets (60 mg total) by mouth 2 (two) times daily. 180 tablet 5   TRUEplus Lancets 28G MISC Use as instructed. Check blood glucose level by fingerstick twice per day. 200 each 3   XARELTO 20 MG TABS tablet TAKE 1 TABLET (20 MG TOTAL) BY MOUTH DAILY WITH SUPPER (BEDTIME) 90 tablet 3   No current facility-administered medications on file prior to visit.    No Known Allergies   Assessment/Plan:  1. Weight loss - Patient has not met goal of at least 5% of body weight loss with comprehensive lifestyle modifications alone in the past 3-6 months. Pharmacotherapy is appropriate to pursue as augmentation. Will start ***. Confirmed patient not ***pregnant and no personal or family history of medullary thyroid carcinoma (MTC) or Multiple Endocrine Neoplasia syndrome type 2 (MEN 2). Injection technique reviewed at today's visit.  Advised patient on common side effects including nausea, diarrhea, dyspepsia, decreased appetite, and fatigue. Counseled patient on reducing meal size and how to titrate medication to minimize side effects. Counseled patient to call if intolerable side effects or if experiencing dehydration, abdominal pain, or dizziness. Patient will adhere to dietary modifications and will target at least 150 minutes of moderate intensity exercise weekly.   Titration Plan:  Will plan to follow the titration plan as below, pending patient is tolerating each dose before increasing to the next. Can slow titration if needed for tolerability.    -Month 1: Inject *** SQ once weekly x 4 weeks -Month 2: Inject *** SQ once weekly x 4 weeks -Month 3: Inject *** SQ once weekly x 4 weeks -Month 4+: Inject *** SQ once weekly   Follow up in ***.

## 2022-11-11 ENCOUNTER — Other Ambulatory Visit: Payer: Self-pay | Admitting: Family Medicine

## 2022-11-11 DIAGNOSIS — R1084 Generalized abdominal pain: Secondary | ICD-10-CM

## 2022-11-11 NOTE — Telephone Encounter (Signed)
Requested Prescriptions  Pending Prescriptions Disp Refills   omeprazole (PRILOSEC) 20 MG capsule [Pharmacy Med Name: OMEPRAZOLE 20 MG ORAL CAPSULE DELAYED RELEASE] 180 capsule 1    Sig: TAKE 1 CAPSULE 2 (TWO) TIMES DAILY BEFORE A MEAL. TAKE FIRST CAPSULE 30 MIN PRIOR TO EATING OR TAKING OTHER MEDICATIONS. (AM+BEDTIME)     Gastroenterology: Proton Pump Inhibitors Passed - 11/11/2022 10:21 AM      Passed - Valid encounter within last 12 months    Recent Outpatient Visits           1 month ago Primary hypertension   [place], Vernia Buff, NP   7 months ago Essential hypertension   [place], Vernia Buff, NP   1 year ago Essential hypertension   [place], NP   2 years ago Wound of abdomen   [place] [name], NP   2 years ago Hospital discharge follow-up   [facility] [name], NP       Future Appointments             In 2 months [name], NP [place] 325 (65 Fe) MG tablet [Pharmacy Med Name: FEROSUL 325 (65 FE) MG ORAL TABLET] 30 tablet     Sig: TAKE 1 TABLET (325 MG TOTAL) BY MOUTH DAILY AT NOON.     Endocrinology:  Minerals - Iron Supplementation Failed - 11/11/2022 10:21 AM      Failed - HGB in normal range and within 360 days    Hemoglobin  Date Value Ref Range Status  07/18/2022 12.2 (L) 13.0 - 17.0 g/dL Final  12/21/2020 12.8 (L) 13.0 - 17.7 g/dL Final   Total hemoglobin  Date Value Ref Range Status  08/02/2019 10.1 (L) 12.0 - 16.0 g/dL Final         Failed - HCT in normal range and within 360 days    HCT  Date Value Ref Range Status  07/18/2022 38.6 (L) 39.0 - 52.0 % Final   Hematocrit  Date Value Ref Range Status  12/21/2020 39.9 37.5 - 51.0 % Final         Failed -  Fe (serum) in normal range and within 360 days    Iron  Date Value Ref Range Status  05/30/2019 14 (L) 45 - 182 ug/dL Final   Saturation Ratios  Date Value Ref Range Status  05/30/2019 4 (L) 17.9 - 39.5 % Final         Failed - Ferritin in normal range and within 360 days    Ferritin  Date Value Ref Range Status  05/30/2019 13 (L) 24 - 336 ng/mL Final    Comment:    Performed at [facility], [place] [address] [place], [place] [address]         Passed - RBC in normal range and within 360 days    RBC  Date Value Ref Range Status  07/18/2022 4.71 4.22 - 5.81 MIL/uL Final         Passed - Valid encounter within last 12 months    Recent Outpatient Visits           1 month ago Primary hypertension   [place], Vernia Buff, NP   7 months ago Essential hypertension   [place], Vernia Buff, NP   1 year ago Essential hypertension   [place], Vernia Buff, NP   2 years ago Wound of abdomen   [place] [name], NP   2 years ago Hospital discharge follow-up   [facility] [name], NP       Future Appointments             In 2 months [name], NP [place]

## 2022-11-11 NOTE — Telephone Encounter (Signed)
Requested medication (s) are due for refill today: yes  Requested medication (s) are on the active medication list: yes  Last refill:  09/15/22 #30  Future visit scheduled: yes  Notes to clinic:  overdue Fe and Ferritin   Requested Prescriptions  Pending Prescriptions Disp Refills   ferrous sulfate (FEROSUL) 325 (65 FE) MG tablet [Pharmacy Med Name: FEROSUL 325 (65 FE) MG ORAL TABLET] 30 tablet     Sig: TAKE 1 TABLET (325 MG TOTAL) BY MOUTH DAILY AT NOON.     Endocrinology:  Minerals - Iron Supplementation Failed - 11/11/2022 10:21 AM      Failed - HGB in normal range and within 360 days    Hemoglobin  Date Value Ref Range Status  07/18/2022 12.2 (L) 13.0 - 17.0 g/dL Final  12/21/2020 12.8 (L) 13.0 - 17.7 g/dL Final   Total hemoglobin  Date Value Ref Range Status  08/02/2019 10.1 (L) 12.0 - 16.0 g/dL Final         Failed - HCT in normal range and within 360 days    HCT  Date Value Ref Range Status  07/18/2022 38.6 (L) 39.0 - 52.0 % Final   Hematocrit  Date Value Ref Range Status  12/21/2020 39.9 37.5 - 51.0 % Final         Failed - Fe (serum) in normal range and within 360 days    Iron  Date Value Ref Range Status  05/30/2019 14 (L) 45 - 182 ug/dL Final   Saturation Ratios  Date Value Ref Range Status  05/30/2019 4 (L) 17.9 - 39.5 % Final         Failed - Ferritin in normal range and within 360 days    Ferritin  Date Value Ref Range Status  05/30/2019 13 (L) 24 - 336 ng/mL Final    Comment:    Performed at [facility], [place] [address] [place], [place] [address]         Passed - RBC in normal range and within 360 days    RBC  Date Value Ref Range Status  07/18/2022 4.71 4.22 - 5.81 MIL/uL Final         Passed - Valid encounter within last 12 months    Recent Outpatient Visits           1 month ago Primary hypertension   [place], Vernia Buff, NP   7 months ago Essential hypertension   [place], Vernia Buff, NP   1 year ago Essential hypertension   [place], Vernia Buff, NP   2 years ago Wound of abdomen   [place] [name], NP   2 years ago Hospital discharge follow-up   [facility] [name], NP       Future Appointments             In 2 months [name], NP [place]            Signed Prescriptions Disp Refills   omeprazole (PRILOSEC) 20 MG capsule 180 capsule 1    Sig: TAKE 1 CAPSULE 2 (TWO) TIMES DAILY BEFORE A MEAL. TAKE FIRST CAPSULE 30 MIN PRIOR TO EATING OR TAKING OTHER MEDICATIONS. (AM+BEDTIME)     Gastroenterology: Proton Pump Inhibitors Passed - 11/11/2022 10:21 AM  Passed - Valid encounter within last 12 months    Recent Outpatient Visits           1 month ago Primary hypertension   [place], Vernia Buff, NP   7 months ago Essential hypertension   [place], Vernia Buff, NP   1 year ago Essential hypertension   [place], Vernia Buff, NP   2 years ago Wound of abdomen   [place] [name], NP   2 years ago Hospital discharge follow-up   [facility] [name], NP       Future Appointments             In 2 months [name], NP [place]

## 2022-12-08 ENCOUNTER — Other Ambulatory Visit (HOSPITAL_COMMUNITY): Payer: Self-pay | Admitting: Family Medicine

## 2022-12-13 ENCOUNTER — Telehealth (HOSPITAL_COMMUNITY): Payer: Self-pay | Admitting: Licensed Clinical Social Worker

## 2022-12-13 NOTE — Telephone Encounter (Signed)
H&V Care Navigation CSW Progress Note  Clinical Social Worker received call from pt who reports he will be going into [name] in [place] on Friday.  He is requesting help with a gift card to get some basic hygiene products for him to take with him.  CSW assisted with $25 gift card.   SDOH Screenings   Food Insecurity: No Food Insecurity (08/03/2021)  Housing: High Risk (02/23/2022)  Transportation Needs: Unmet Transportation Needs (09/27/2022)  Utilities: Not At Risk (09/27/2022)  Recent Concern: Utilities - At Risk (07/14/2022)  Depression (PHQ2-9): High Risk (10/04/2022)  Financial Resource Strain: High Risk (12/13/2022)  Tobacco Use: Medium Risk (10/04/2022)   [name], LCSW Clinical Social Worker [facility] Desk#: [phone] Cell#: [phone]

## 2023-01-10 ENCOUNTER — Encounter (HOSPITAL_COMMUNITY): Payer: Medicaid Other

## 2023-01-11 ENCOUNTER — Telehealth (HOSPITAL_COMMUNITY): Payer: Self-pay | Admitting: Licensed Clinical Social Worker

## 2023-01-11 NOTE — Telephone Encounter (Signed)
H&V Care Navigation CSW Progress Note  Clinical Social Worker received call from pt requesting help getting necessary medical info to [facility] so he can be admitted there.  Pt provided CSW verbal permission to reach out to them- spoke with [facility] rep and provided requested information.   SDOH Screenings   Food Insecurity: No Food Insecurity (08/03/2021)  Housing: High Risk (02/23/2022)  Transportation Needs: Unmet Transportation Needs (09/27/2022)  Utilities: Not At Risk (09/27/2022)  Recent Concern: Utilities - At Risk (07/14/2022)  Depression (PHQ2-9): High Risk (10/04/2022)  Financial Resource Strain: High Risk (12/13/2022)  Tobacco Use: Medium Risk (10/04/2022)    [name], LCSW Clinical Social Worker [facility] Desk#: [phone] Cell#: [phone]

## 2023-01-12 ENCOUNTER — Encounter (HOSPITAL_BASED_OUTPATIENT_CLINIC_OR_DEPARTMENT_OTHER): Payer: Medicaid Other

## 2023-01-12 ENCOUNTER — Ambulatory Visit (HOSPITAL_BASED_OUTPATIENT_CLINIC_OR_DEPARTMENT_OTHER): Payer: Medicaid Other | Admitting: Pulmonary Disease

## 2023-01-17 ENCOUNTER — Other Ambulatory Visit: Payer: Self-pay | Admitting: Family Medicine

## 2023-01-17 ENCOUNTER — Ambulatory Visit: Payer: Medicaid Other | Attending: Nurse Practitioner | Admitting: Nurse Practitioner

## 2023-01-17 DIAGNOSIS — D508 Other iron deficiency anemias: Secondary | ICD-10-CM

## 2023-01-23 ENCOUNTER — Telehealth (HOSPITAL_COMMUNITY): Payer: Self-pay | Admitting: Licensed Clinical Social Worker

## 2023-01-23 NOTE — Telephone Encounter (Signed)
H&V Care Navigation CSW Progress Note  Clinical Social Worker received call from pt requesting help with paperwork from [employer].  Pt came to clinic and CSW assisted with filling out SSI verification paperwork to get pt SSI rate as well as medical essential program to avoid electricity being turned off.  Pt states he is behind on energy bill because he was scammed by fake app to pay for utilities.  Pt is behind $491 in [employer].  CSW logged into pt epass site and helped to apply for crisis assistance online.   Pt expresses struggle with depression and continued struggle with cocaine abuse.  Is working on getting into rehab with help from parole officer- is awaiting to hear back from [facility] to see if he can be admitted- hopeful this will help him with his problems.  Expresses thoughts of wanting to die and not having friends.  CSW provided active listening and worked with pt to contact [facility]CM to get help from his assigned case manager through [place]- unable to reach- has left VM and sent email.  Pt has missed many appts due to lack of transportation- CSW called [employer]HS with pt and they are planning ot call him back to complete an assessment to utilize services.   SDOH Screenings   Food Insecurity: No Food Insecurity (08/03/2021)  Housing: High Risk (02/23/2022)  Transportation Needs: Unmet Transportation Needs (01/23/2023)  Utilities: Not At Risk (01/23/2023)  Depression (PHQ2-9): High Risk (10/04/2022)  Financial Resource Strain: High Risk (12/13/2022)  Tobacco Use: Medium Risk (10/04/2022)   [name], LCSW Clinical Social Worker [facility] Desk#: [phone] Cell#: [phone]

## 2023-01-26 ENCOUNTER — Telehealth (HOSPITAL_COMMUNITY): Payer: Self-pay | Admitting: Licensed Clinical Social Worker

## 2023-01-26 NOTE — Telephone Encounter (Signed)
H&V Care Navigation CSW Progress Note  Clinical Social Worker called pt to check in.  Pt inquiring about status of crisis assistance program with [employer]HS- CSW logged into EPASS account and saw that pt application has been denied- no message as to why.  CSW reached out to Interior and spatial designer of assistance with [employer] to inquire about assessing patient for assistance- awaiting response.   SDOH Screenings   Food Insecurity: No Food Insecurity (08/03/2021)  Housing: High Risk (02/23/2022)  Transportation Needs: Unmet Transportation Needs (01/23/2023)  Utilities: Not At Risk (01/23/2023)  Depression (PHQ2-9): High Risk (10/04/2022)  Financial Resource Strain: High Risk (12/13/2022)  Tobacco Use: Medium Risk (10/04/2022)    [name], LCSW Clinical Social Worker [facility] Desk#: [phone] Cell#: [phone]

## 2023-02-01 ENCOUNTER — Telehealth (HOSPITAL_COMMUNITY): Payer: Self-pay | Admitting: Licensed Clinical Social Worker

## 2023-02-01 NOTE — Telephone Encounter (Signed)
H&V Care Navigation CSW Progress Note  Clinical Social Worker received call from pt informing that he is behind over $500 on electric bill and is in danger of shut off if he doesn't pay $201.74.  CSW had referred to [employer] but he could not provide all required paperwork (proof that he had been scammed) so they could not help him.  CSW able to assist through patient care fund.  CSW and pt had called DHHS to do assessment for transportation and they were supposed to call back ot complete but per patient they haven't- told him he needs to call them again or go in person as he has several upcoming appts.   SDOH Screenings   Food Insecurity: No Food Insecurity (08/03/2021)  Housing: High Risk (02/23/2022)  Transportation Needs: Unmet Transportation Needs (01/23/2023)  Utilities: Not At Risk (01/23/2023)  Depression (PHQ2-9): High Risk (10/04/2022)  Financial Resource Strain: High Risk (01/26/2023)  Tobacco Use: Medium Risk (10/04/2022)    [name], LCSW Clinical Social Worker [facility] Desk#: [phone] Cell#: [phone]

## 2023-02-10 ENCOUNTER — Telehealth (HOSPITAL_COMMUNITY): Payer: Self-pay | Admitting: Licensed Clinical Social Worker

## 2023-02-10 ENCOUNTER — Encounter (HOSPITAL_BASED_OUTPATIENT_CLINIC_OR_DEPARTMENT_OTHER): Payer: Medicaid Other

## 2023-02-10 NOTE — Telephone Encounter (Signed)
H&V Care Navigation CSW Progress Note  Clinical Social Worker called pt to check in.  Pt feeling very down.  No transportation, no friends he can trust, low on food, can't get to medical appts.    CSW planning to meet with pt Tuesday to address food and MD visits.   Discussed coping mechanisms with pt.  He used to go on walks when he was down- encouraged him to do so again since he feels so trapped by his home.  Pt feeling like he is alone in his struggles.  Encouraged him to go to NA meeting to connect with others who are struggling- provided him link to Zoom meeting taking place this evening.  SDOH Screenings   Food Insecurity: No Food Insecurity (08/03/2021)  Housing: High Risk (02/23/2022)  Transportation Needs: Unmet Transportation Needs (01/23/2023)  Utilities: Not At Risk (01/23/2023)  Depression (PHQ2-9): High Risk (10/04/2022)  Financial Resource Strain: High Risk (01/26/2023)  Tobacco Use: Medium Risk (10/04/2022)     [name], LCSW Clinical Social Worker [facility] Desk#: [phone] Cell#: [phone]

## 2023-02-15 ENCOUNTER — Telehealth (HOSPITAL_COMMUNITY): Payer: Self-pay | Admitting: Licensed Clinical Social Worker

## 2023-02-15 NOTE — Telephone Encounter (Signed)
H&V Care Navigation CSW Progress Note  Clinical Social Worker contacted patient by phone to discuss in person meeting.   CSW called pt to inquire about coming in to clinic to work on getting back on track with medical appts etc- pt reports he now has plans this afternoon.  He attended the virtual sessions for NA meetings like we talked about and had very positive experience.  Has now befriended multiple people from the group and has seen them in person already.  Is going with them this afternoon to learn more about an inpatient program.  Will plan to come and see me in the next few days.   SDOH Screenings   Food Insecurity: No Food Insecurity (08/03/2021)  Housing: High Risk (02/23/2022)  Transportation Needs: Unmet Transportation Needs (01/23/2023)  Utilities: Not At Risk (01/23/2023)  Depression (PHQ2-9): High Risk (10/04/2022)  Financial Resource Strain: High Risk (01/26/2023)  Tobacco Use: Medium Risk (10/04/2022)   [name], LCSW Clinical Social Worker [facility] Desk#: [phone] Cell#: [phone]

## 2023-02-22 ENCOUNTER — Other Ambulatory Visit: Payer: Self-pay | Admitting: Pharmacist

## 2023-02-22 ENCOUNTER — Encounter (HOSPITAL_COMMUNITY): Payer: Self-pay

## 2023-02-22 ENCOUNTER — Telehealth: Payer: Self-pay | Admitting: Nurse Practitioner

## 2023-02-22 ENCOUNTER — Ambulatory Visit (HOSPITAL_COMMUNITY)
Admission: RE | Admit: 2023-02-22 | Discharge: 2023-02-22 | Disposition: A | Discharge status: Home / Self Care | Payer: Medicaid Other | Source: Ambulatory Visit | Attending: Family Medicine | Admitting: Family Medicine

## 2023-02-22 VITALS — BP 168/90 | HR 99 | Wt 333.0 lb

## 2023-02-22 DIAGNOSIS — I2699 Other pulmonary embolism without acute cor pulmonale: Secondary | ICD-10-CM

## 2023-02-22 DIAGNOSIS — I480 Paroxysmal atrial fibrillation: Secondary | ICD-10-CM

## 2023-02-22 DIAGNOSIS — R0602 Shortness of breath: Secondary | ICD-10-CM | POA: Insufficient documentation

## 2023-02-22 DIAGNOSIS — Z7901 Long term (current) use of anticoagulants: Secondary | ICD-10-CM | POA: Diagnosis not present

## 2023-02-22 DIAGNOSIS — Z6841 Body Mass Index (BMI) 40.0 and over, adult: Secondary | ICD-10-CM | POA: Diagnosis not present

## 2023-02-22 DIAGNOSIS — K219 Gastro-esophageal reflux disease without esophagitis: Secondary | ICD-10-CM | POA: Diagnosis not present

## 2023-02-22 DIAGNOSIS — M7989 Other specified soft tissue disorders: Secondary | ICD-10-CM | POA: Insufficient documentation

## 2023-02-22 DIAGNOSIS — E1122 Type 2 diabetes mellitus with diabetic chronic kidney disease: Secondary | ICD-10-CM | POA: Insufficient documentation

## 2023-02-22 DIAGNOSIS — Z7984 Long term (current) use of oral hypoglycemic drugs: Secondary | ICD-10-CM | POA: Insufficient documentation

## 2023-02-22 DIAGNOSIS — G4733 Obstructive sleep apnea (adult) (pediatric): Secondary | ICD-10-CM | POA: Diagnosis not present

## 2023-02-22 DIAGNOSIS — N183 Chronic kidney disease, stage 3 unspecified: Secondary | ICD-10-CM | POA: Insufficient documentation

## 2023-02-22 DIAGNOSIS — Z8616 Personal history of COVID-19: Secondary | ICD-10-CM | POA: Insufficient documentation

## 2023-02-22 DIAGNOSIS — F1910 Other psychoactive substance abuse, uncomplicated: Secondary | ICD-10-CM

## 2023-02-22 DIAGNOSIS — I5032 Chronic diastolic (congestive) heart failure: Secondary | ICD-10-CM

## 2023-02-22 DIAGNOSIS — I130 Hypertensive heart and chronic kidney disease with heart failure and stage 1 through stage 4 chronic kidney disease, or unspecified chronic kidney disease: Secondary | ICD-10-CM | POA: Insufficient documentation

## 2023-02-22 DIAGNOSIS — I10 Essential (primary) hypertension: Secondary | ICD-10-CM

## 2023-02-22 DIAGNOSIS — Z86711 Personal history of pulmonary embolism: Secondary | ICD-10-CM | POA: Diagnosis not present

## 2023-02-22 DIAGNOSIS — Z79899 Other long term (current) drug therapy: Secondary | ICD-10-CM | POA: Insufficient documentation

## 2023-02-22 DIAGNOSIS — E1165 Type 2 diabetes mellitus with hyperglycemia: Secondary | ICD-10-CM

## 2023-02-22 DIAGNOSIS — E118 Type 2 diabetes mellitus with unspecified complications: Secondary | ICD-10-CM

## 2023-02-22 LAB — CBC
HCT: 45.1 % (ref 39.0–52.0)
Hemoglobin: 13.7 g/dL (ref 13.0–17.0)
MCH: 24.9 pg — ABNORMAL LOW (ref 26.0–34.0)
MCHC: 30.4 g/dL (ref 30.0–36.0)
MCV: 81.9 fL (ref 80.0–100.0)
Platelets: 389 10*3/uL (ref 150–400)
RBC: 5.51 MIL/uL (ref 4.22–5.81)
RDW: 14.5 % (ref 11.5–15.5)
WBC: 7.1 10*3/uL (ref 4.0–10.5)
nRBC: 0 % (ref 0.0–0.2)

## 2023-02-22 LAB — BASIC METABOLIC PANEL
Anion gap: 8 (ref 5–15)
BUN: 18 mg/dL (ref 6–20)
CO2: 26 mmol/L (ref 22–32)
Calcium: 9 mg/dL (ref 8.9–10.3)
Chloride: 103 mmol/L (ref 98–111)
Creatinine, Ser: 1.25 mg/dL — ABNORMAL HIGH (ref 0.61–1.24)
GFR, Estimated: >60 mL/min (ref >60)
Glucose, Bld: 124 mg/dL — ABNORMAL HIGH (ref 70–99)
Potassium: 4.1 mmol/L (ref 3.5–5.1)
Sodium: 137 mmol/L (ref 135–145)

## 2023-02-22 LAB — BRAIN NATRIURETIC PEPTIDE: B Natriuretic Peptide: 11.9 pg/mL (ref 0.0–100.0)

## 2023-02-22 MED ORDER — TRUE METRIX BLOOD GLUCOSE TEST VI STRP: ORAL_STRIP | 12 refills | Status: DC

## 2023-02-22 NOTE — Patient Instructions (Addendum)
Medication Changes:  We recommend that you continue on your current medications as directed. Please refer to the Current Medication list given to you today.   *If you need a refill on your cardiac medications before your next appointment, please call your pharmacy*  Lab Work:  Labs done today, your results will be available in MyChart, we will contact you for abnormal readings.  Your physician has requested that you have an echocardiogram. Echocardiography is a painless test that uses sound waves to create images of your heart. It provides your doctor with information about the size and shape of your heart and how well your heart's chambers and valves are working. This procedure takes approximately one hour. There are no restrictions for this procedure. Please do NOT wear cologne, perfume, aftershave, or lotions (deodorant is allowed). Please arrive 15 minutes prior to your appointment time.    Follow-Up in:   Your physician recommends that you schedule a follow-up appointment in: 3-4 months with Dr. Shirlee Latch **please call late July to schedule your appointment**   Do the following things EVERYDAY: Weigh yourself in the morning before breakfast. Write it down and keep it in a log. Take your medicines as prescribed Eat low salt foods--Limit salt (sodium) to 2000 mg per day.  Stay as active as you can everyday Limit all fluids for the day to less than 2 liters    Need to Contact us:  If you have any questions or concerns before your next appointment please send [place] a message through [place] or call our office at [phone].    TO LEAVE A MESSAGE FOR THE NURSE SELECT OPTION 2, PLEASE LEAVE A MESSAGE INCLUDING: YOUR NAME DATE OF BIRTH CALL BACK NUMBER REASON FOR CALL**this is important as we prioritize the call backs  YOU WILL RECEIVE A CALL BACK THE SAME DAY AS LONG AS YOU CALL BEFORE 4:00 PM   At the [facility], you and your health needs are our priority. As  part of our continuing mission to provide you with exceptional heart care, we have created designated Provider Care Teams. These Care Teams include your primary Cardiologist (physician) and Advanced Practice Providers (APPs- Physician Assistants and Nurse Practitioners) who all work together to provide you with the care you need, when you need it.   You may see any of the following providers on your designated Care Team at your next follow up: Dr Arvilla Meres Dr Marca Ancona Dr. Marcos Eke, NP Robbie Lis, [place] [facility] [place], [place] Brynda Peon, NP Karle Plumber, PharmD   Please be sure to bring in all your medications bottles to every appointment.    Thank you for choosing [facility]

## 2023-02-22 NOTE — Progress Notes (Signed)
H&V Care Navigation CSW Progress Note  Clinical Social Worker helped with transportation to get to todays appts through [employer].  CSW met with pt early to address SDOH concerns.  Pt food stamps up for renewal- CSW assisted in filling out and will submit once pt current proof of income is provided.  Pt needing more glucose test strips but no more refills- CSW assisted in calling PCP to request refills and set up new appt as he missed appt in April due to transport concerns.  Pt missed food MD appt in Feb and is trying to obtain diabetic shoes- CSW helped to arrange new appt   SDOH Screenings   Food Insecurity: No Food Insecurity (08/03/2021)  Housing: High Risk (02/23/2022)  Transportation Needs: Unmet Transportation Needs (01/23/2023)  Utilities: Not At Risk (01/23/2023)  Depression (PHQ2-9): High Risk (10/04/2022)  Financial Resource Strain: High Risk (01/26/2023)  Tobacco Use: Medium Risk (10/04/2022)   02/22/2023  Benjamin [name] DOB: [birth year] MRN: [id number]   RIDER WAIVER AND RELEASE OF LIABILITY  For the purposes of helping with transportation needs, [facility] partners with outside transportation providers (taxi companies, [place], Catering manager.) to give [employer] patients or other approved people the choice of on-demand rides [name]") to our buildings for non-emergency visits.  By using [employer], I, the person signing this document, on behalf of myself and/or any legal minors (in my care using the [employer]), agree:  Science writer given to me are supplied by independent, outside transportation providers who do not work for, or have any affiliation with, [employer]. [place] is not a transportation company. [facility] has no control over the quality or safety of the rides I get using [employer]. [facility] has no control over whether any outside ride will happen on time or not. [facility] gives no guarantee on the reliability,  quality, safety, or availability on any rides, or that no mistakes will happen. I know and accept that traveling by vehicle (car, truck, SVU, Zenaida Niece, bus, taxi, etc.) has risks of serious injuries such as disability, being paralyzed, and death. I know and agree the risk of using [employer] is mine alone, and not [employer]. Transport Services are provided "as is" and as are available. The transportation providers are in charge for all inspections and care of the vehicles used to provide these rides. I agree not to take legal action against [place], its agents, employees, officers, directors, representatives, insurers, attorneys, assigns, successors, subsidiaries, and affiliates at any time for any reasons related directly or indirectly to using [employer]. I also agree not to take legal action against [place] or its affiliates for any injury, death, or damage to property caused by or related to using [employer]. I have read this Waiver and Release of Liability, and I understand the terms used in it and their legal meaning. This Waiver is freely and voluntarily given with the understanding that my right (or any legal minors) to legal action against [place] relating to [employer] is knowingly given up to use these services.   I attest that I read the Ride Waiver and Release of Liability to Benjamin [name], gave Benjamin [name] the opportunity to ask questions and answered the questions asked (if any). I affirm that Benjamin [name] then provided consent for assistance with transportation.     [employer] [facility]

## 2023-02-22 NOTE — Progress Notes (Signed)
Advanced Heart Failure Clinic Note   PCP: Claiborne Rigg, NP Primary Cardiologist: Dietrich Pates, MD  HF Cardiologist: Dr. Shirlee Latch   HPI: Benjamin [name] is a 57 y.o. with hx of diastolic CHF, HTN, DM, Atrial fib, PE in April 2020 (on Xarelto), OSA, GERD, chronic elevation of troponin.    Admitted 01/21/2019 with increased dyspnea and had PE. He was anticoagulated.    Patient re-admitted 5/20 for syncope/cough/dyspnea, COVID+, CT positive for bilateral GGOs, admitted to [facility] and discharged after 6 days, on O2.  Rec'd Cards f/u.   Patient re-admitted 02/20/19 for bright red blood per rectum, eval'ed by GI, had EGD/colon, dx'd with hemorrhoidal bleeding, discharged after 9 days on O2. He was again Covid + .   Readmitted 03/11/19 with increased shortness of breath. He did not have home oxygen. He was discharged to home the next day.    He presented to [facility] ED 04/11/19 with lower extremity edema. COVID negative. Diuresed with lasix drip and transitioned to torsemide 40 mg twice daily. Had RHC with mild volume overload and preserved cardiac output. Discharge weight was 407.9 pounds.    He was readmitted with symptomatic anemia 05/28/19 with hgb 7.1. Received 1UPRBCs. GI consulted. He started on anusol and continued on stool softner.    Readmitted 07/16/19 with a/c diastolic CHF and treated w/ IV Lasix. Had 2 day hospital stay and was discharged on 10/29. Echo showed normal LVEF 65-70%, RV interpreted as normal but suspect significant RV dysfunction.  After diuresis w/ IV Lasix, he was placed back on home diuretic regimen, torsemide 100 mg bid. Lisinopril 5 mg was also added to regimen. AHF team was not consulted that admit.   He was seen back in clinic 07/30/19. Felt poorly. SOB at rest and worse w/ exertion. Also w/ orthopnea/PND. Significant wt gain of 20 lb up to 428 lb (dry wt ~407 lb), w/ marked abdominal distention and LEE and poor response to IV diuretics.  Once diuresed transitioned to torsemide 100  mg twice a day. D/C 408 pounds.   Zio patch on 10/20/19-No arrhythmia.   Re-admitted 08/19/20-08/24/20 for  A fib RVR. A. fib RVR in the setting of noncompliance ethanol and cocaine. Cardizem started and labetalol continued.  12/3 TEE (EF 60-65%) with DCCV successfully converted to SR. Diuresed with IV lasix and had AKI in setting of over diuresis. SCr 1.59 on admit, bumped to 2.5. Diuretics held + IVF hydration.   Admitted 12/24/20 with hypertensive emergency and respiratory failure. .   Admitted 4/22 with A Fib RVR & AMS. Diltiazem increased to 360 mg daily. Chemically converted to NSR.  + Cocaine.   Seen in ED 11/22 for COPD exacerbation.  Seen 11/02/21 and had been off all meds x 1 week, volume overloaded. Meds restarted and instructed to take metolazone 2.5 x 2 days w/ extra 40 kcl, however labs showed elevated BUN/SCr and left message to take metolazone x 1 day.  Re-enrolled in paramedicine.  Echo 5/23 EF 60-65%, moderate LVH, mid-cavity LV gradient to 29 mmHg, RV normal.   Follow up 11/23, NYHA III and volume mildly up. Torsemide increased to 60/40. Had recent cocaine relapse.  Follow up 1/24, NYHA III and volume mildly up. Torsemide increased to 60 bid, had recent cocaine relapse.  Today he returns for HF follow up. Overall feeling fine. Wears 3L oxygen with activity, compliant with CPAP at night. Feet are swelling.  Denies palpitations, abnormal bleeding, CP, dizziness, or PND/Orthopnea. Appetite ok. No fever or chills. Weight  at home 340 pounds. Taking all medications. Uses cocaine about once a month, trying to cut back. Attends virtual NA meetings.  ECG (personally reviewed): NSR 87 bpm  Labs (11/20): BNP 32, K 3.6, creatinine 1.09  Labs (12/20): K 3.3, creatinine 1.28 Labs (1/21): K 3.9, creatinine 1.4  Labs (11/05/19) : K 3.2 Creatinine 1.47  Labs (4/21) : K 3.5, creatinine 1.14  Labs (6/21): K 3.8, creatinine 1.09.  Labs (12/21): K 4.0, creatinine 1.17, Magnesium 1.6 Labs  (12/21): K 3.1, creatinine 2.54 Labs (1/22): K 3.1, creatinine 1.56 Labs (5/22): K 3.6, creatinine 1.89 Labs (1/23): K 3.9, creatinine 1.34 Labs (3/23): K 4.0, creatinine 1.29 => 1.77 Labs (5/23); K 4.3, creatinine 1.47, LDL 65, HDL 44, normal LFTs Labs (40/98): K 3.6, creatinine 1.5, LDL 49 Labs (11/23): K 3.7, creatinine 1.59 Labs (1/24): K 3.8, creatinine 1.73  Review of Systems: All systems reviewed and negative except as per HPI.   PMH: 1. Atrial fibrillation: Paroxysmal.  - DCCV (08/21/20): converted to SR 2. Pulmonary embolus: 5/20.  3. OHS/OSA: He is on home oxygen during the day and uses CPAP at night.  4. Morbid obesity.  5. Chronic diastolic CHF:  - RHC (7/20): mean RA 12, PA 40/25 mean 31, mean PCWP 23, CI 2.47, PVR 1.1 WU.  - Echo (10/20): EF 65-70%, mild LVH, normal RV size and systolic function.  - TEE (12/21): EF 60-65% - Cardiolite in 12/21: fixed apical defect is likely artifact, no ischemia.  - Echo (5/23): EF 60-65%, moderate LVH, mid-cavity LV gradient to 29 mmHg, RV normal.  6. Type 2 diabetes 7. HTN 8. Rectal bleeding: ?Hemorrhoidal.  9. COVID-19 infection 7/20.  10. ZIo Patch - 10/20/19 no arrhythmias 11. CKD: Stage 3 12. Depression 13. Cocaine abuse  Current Outpatient Medications  Medication Sig Dispense Refill   albuterol (PROVENTIL) (2.5 MG/3ML) 0.083% nebulizer solution INHALE 1 VIAL BY NEBULIZATION EVERY 6 (SIX) HOURS AS NEEDED FOR WHEEZING OR SHORTNESS OF BREATH. 150 mL 1   atorvastatin (LIPITOR) 40 MG tablet Take 1 tablet (40 mg total) by mouth at bedtime. 90 tablet 3   Blood Glucose Monitoring Suppl (TRUE METRIX METER) w/Device KIT Use as instructed. Check blood glucose level by fingerstick twice per day. 1 kit 0   Blood Pressure Monitor DEVI Please provide patient with insurance approved blood pressure device with L-XL cuff. BMI 55 1 each 0   COMBIVENT RESPIMAT 20-100 MCG/ACT AERS respimat INHALE 1 PUFF INTO THE LUNGS EVERY 6 (SIX) HOURS. 4 g 5    diltiazem (CARDIZEM CD) 360 MG 24 hr capsule TAKE 1 CAPSULE (360 MG TOTAL) BY MOUTH DAILY. (NOON) 90 capsule 3   FEROSUL 325 (65 Fe) MG tablet TAKE 1 TABLET (325 MG TOTAL) BY MOUTH DAILY AT NOON. 90 tablet 0   glucose blood (TRUE METRIX BLOOD GLUCOSE TEST) test strip Use as instructed. Check blood glucose level by fingerstick twice per day. 100 each 12   hydrALAZINE (APRESOLINE) 50 MG tablet TAKE 1 TABLET (50 MG TOTAL) BY MOUTH 3 (THREE) TIMES DAILY. (NOON,EVENING,BEDTIME) 180 tablet 3   JARDIANCE 10 MG TABS tablet TAKE 1 TABLET (10 MG TOTAL) BY MOUTH DAILY BEFORE BREAKFAST (AM) 30 tablet 6   losartan (COZAAR) 50 MG tablet TAKE 1 TABLET (50 MG TOTAL) BY MOUTH DAILY. (NOON) 90 tablet 3   magnesium oxide (MAG-OX) 400 (240 Mg) MG tablet TAKE 1 TABLET (400 MG TOTAL) BY MOUTH 2 (TWO) TIMES DAILY (NOON+BEDTIME) 90 tablet 3   metFORMIN (GLUCOPHAGE) 500 MG  tablet TAKE 1 TABLET BY MOUTH 2 (TWO) TIMES DAILY WITH A MEAL (NOON+BEDTIME) 60 tablet 0   omeprazole (PRILOSEC) 20 MG capsule TAKE 1 CAPSULE 2 (TWO) TIMES DAILY BEFORE A MEAL. TAKE FIRST CAPSULE 30 MIN PRIOR TO EATING OR TAKING OTHER MEDICATIONS. (AM+BEDTIME) 180 capsule 1   potassium chloride (KLOR-CON) 10 MEQ tablet TAKE 8 TABLETS (80 MEQ TOTAL) BY MOUTH DAILY. (4 NOON+4 EVENING) 240 tablet 11   sertraline (ZOLOFT) 25 MG tablet Take 1 tablet (25 mg total) by mouth daily. 30 tablet 1   spironolactone (ALDACTONE) 50 MG tablet TAKE 1 TABLET (50 MG TOTAL) BY MOUTH DAILY. (NOON) 30 tablet 11   tamsulosin (FLOMAX) 0.4 MG CAPS capsule Take 0.4 mg by mouth daily after supper.     torsemide (DEMADEX) 20 MG tablet Take 3 tablets (60 mg total) by mouth 2 (two) times daily. 180 tablet 5   TRUEplus Lancets 28G MISC Use as instructed. Check blood glucose level by fingerstick twice per day. 200 each 3   XARELTO 20 MG TABS tablet TAKE 1 TABLET (20 MG TOTAL) BY MOUTH DAILY WITH SUPPER (BEDTIME) 90 tablet 3   No current facility-administered medications for this  encounter.   No Known Allergies  Social History   Socioeconomic History   Marital status: Single    Spouse name: Not on file   Number of children: Not on file   Years of education: Not on file   Highest education level: Not on file  Occupational History   Not on file  Tobacco Use   Smoking status: Former   Smokeless tobacco: Never   Tobacco comments:    smoked weed in the past  Vaping Use   Vaping Use: Never used  Substance and Sexual Activity   Alcohol use: Not Currently   Drug use: Yes    Types: Marijuana, Cocaine   Sexual activity: Not Currently  Other Topics Concern   Not on file  Social History Narrative   Not on file   Social Determinants of Health   Financial Resource Strain: High Risk (01/26/2023)   Overall Financial Resource Strain (CARDIA)    Difficulty of Paying Living Expenses: Very hard  Food Insecurity: No Food Insecurity (08/03/2021)   Hunger Vital Sign    Worried About Running Out of Food in the Last Year: Never true    Ran Out of Food in the Last Year: Never true  Transportation Needs: Unmet Transportation Needs (02/22/2023)   PRAPARE - Administrator, Civil Service (Medical): Yes    Lack of Transportation (Non-Medical): Yes  Physical Activity: Not on file  Stress: Not on file  Social Connections: Not on file  Intimate Partner Violence: Not on file   Family History  Problem Relation Age of Onset   Hypertension Mother    Diabetes Mother    BP (!) 168/90   Pulse 99   Wt (!) 151 kg (333 lb)   SpO2 94%   BMI 46.44 kg/m   Wt Readings from Last 3 Encounters:  02/22/23 (!) 151 kg (333 lb)  10/04/22 (!) 150.3 kg (331 lb 6.4 oz)  09/27/22 (!) 151.5 kg (334 lb)   PHYSICAL EXAM: General:  NAD. No resp difficulty, walked into clinic on oxygen HEENT: Normal Neck: Supple. No JVD. Carotids 2+ bilat; no bruits. No lymphadenopathy or thryomegaly appreciated. Cor: PMI nondisplaced. Regular rate & rhythm. No rubs, gallops or murmurs. Lungs:  Clear, diminished in bases Abdomen: Soft, obese, nontender, nondistended. No hepatosplenomegaly. No bruits  or masses. Good bowel sounds. Extremities: No cyanosis, clubbing, rash, trace BLE edema L>R with varicoscities Neuro: Alert & oriented x 3, cranial nerves grossly intact. Moves all 4 extremities w/o difficulty. Affect pleasant.  ASSESSMENT & PLAN: 1. Chronic diastolic CHF: Echo 5/23 showed EF 60-65%, moderate LVH, mid-cavity LV gradient to 29 mmHg, RV normal.  Suspect LVH is due to long-standing HTN. Stable NYHA class III symptoms, he is not volume overloaded by exam today, weight is stable. - Continue torsemide 60 mg bid + 80 KCL daily. BMET and BNP today. - Continue Jardiance 10 mg daily. No GU symptoms. - Continue spironolactone 50 mg daily.  - Continue losartan 50 mg daily. - Continue hydralazine 50 mg tid. - Cannot get Cardiomems as he has Medicaid.  - Update echo next visit. 2. Atrial fibrillation: Paroxysmal.  NSR on ECG today. - Continue current dose of diltiazem.  - Continue Xarelto. No bleeding issues. CBC today. 3. H/o PE: 5/20, continue Xarelto.  4. OHS/OSA: Uses O2 during the day and CPAP at night.  5. HTN: BP elevated in clinic but has not had meds for today. - Continue current regimen. 6. DM: Per PCP. Most recent hgb A1c 6.3 7. Obesity: Body mass index is 46.44 kg/m.  - He has diabetes, referred to PharmD to see if we can get him semaglutide. 8. Cocaine: Has cut back. Encouraged complete abstinence - Sees a Veterinary surgeon.   Follow up in 3-4 months with Dr. Shirlee Latch + echo.  Anderson [facility] FNP-BC 02/22/2023

## 2023-02-22 NOTE — Telephone Encounter (Signed)
Unable to reorder requested medication, DX code needed. Routing for approval. glucose blood (TRUE METRIX BLOOD GLUCOSE TEST) test.

## 2023-02-22 NOTE — Telephone Encounter (Signed)
Medication Refill - Medication: glucose blood (TRUE METRIX BLOOD GLUCOSE TEST) test strip   Has the patient contacted their pharmacy? No. (Agent: If no, request that the patient contact the pharmacy for the refill. If patient does not wish to contact the pharmacy document the reason why and proceed with request.) (Agent: If yes, when and what did the pharmacy advise?)  Preferred Pharmacy (with phone number or street name):  [name] - [place], [place] - [name] Phone: [phone]  Fax: [phone]     Has the patient been seen for an appointment in the last year OR does the patient have an upcoming appointment? Yes.    Agent: Please be advised that RX refills may take up to 3 business days. We ask that you follow-up with your pharmacy.

## 2023-02-22 NOTE — Telephone Encounter (Signed)
Rxn sent

## 2023-02-23 ENCOUNTER — Ambulatory Visit (HOSPITAL_BASED_OUTPATIENT_CLINIC_OR_DEPARTMENT_OTHER): Payer: Medicaid Other | Admitting: Pulmonary Disease

## 2023-02-24 ENCOUNTER — Telehealth (HOSPITAL_COMMUNITY): Payer: Self-pay | Admitting: Licensed Clinical Social Worker

## 2023-02-24 NOTE — Telephone Encounter (Signed)
H&V Care Navigation CSW Progress Note  Clinical Social Worker helped completed SNAP application and faxed to [facility] for review- fax confirmation received   SDOH Screenings   Food Insecurity: No Food Insecurity (08/03/2021)  Housing: High Risk (02/23/2022)  Transportation Needs: Unmet Transportation Needs (02/22/2023)  Utilities: Not At Risk (01/23/2023)  Depression (PHQ2-9): High Risk (10/04/2022)  Financial Resource Strain: High Risk (01/26/2023)  Tobacco Use: Medium Risk (02/22/2023)    [name], LCSW Clinical Social Worker Advanced Heart Failure Clinic Desk#: [phone] Cell#: [phone]

## 2023-02-27 ENCOUNTER — Telehealth (HOSPITAL_COMMUNITY): Payer: Self-pay | Admitting: Licensed Clinical Social Worker

## 2023-02-27 NOTE — Telephone Encounter (Signed)
H&V Care Navigation CSW Progress Note  Clinical Social Worker referred pt to [facility] Patient Assistance fund to help with overdue Soil scientist.  They approved assistance of $195.40 to avoid shut off for patient and allow him to pay his remaining balance through payment plan of around $40 a month for 5 months.    CSW assisted in making payment online.   SDOH Screenings   Food Insecurity: Food Insecurity Present (02/24/2023)  Housing: High Risk (02/23/2022)  Transportation Needs: Unmet Transportation Needs (02/22/2023)  Utilities: Not At Risk (01/23/2023)  Depression (PHQ2-9): High Risk (10/04/2022)  Financial Resource Strain: High Risk (02/27/2023)  Tobacco Use: Medium Risk (02/22/2023)    02/27/2023  Benjamin [name] DOB: [birth year] MRN: [id number]   RIDER WAIVER AND RELEASE OF LIABILITY  For the purposes of helping with transportation needs, [facility] partners with outside transportation providers (taxi companies, [place], Catering manager.) to give [employer] patients or other approved people the choice of on-demand rides [name]") to our buildings for non-emergency visits.  By using [employer], I, the person signing this document, on behalf of myself and/or any legal minors (in my care using the [employer]), agree:  Science writer given to me are supplied by independent, outside transportation providers who do not work for, or have any affiliation with, [employer]. [place] is not a transportation company. [facility] has no control over the quality or safety of the rides I get using [employer]. [place] has no control over whether any outside ride will happen on time or not. [facility] gives no guarantee on the reliability, quality, safety, or availability on any rides, or that no mistakes will happen. I know and accept that traveling by vehicle (car, truck, SVU, Zenaida Niece, bus, taxi, etc.) has risks of serious injuries such as disability, being paralyzed, and  death. I know and agree the risk of using [employer] is mine alone, and not [employer]. Transport Services are provided "as is" and as are available. The transportation providers are in charge for all inspections and care of the vehicles used to provide these rides. I agree not to take legal action against [facility], its agents, employees, officers, directors, representatives, insurers, attorneys, assigns, successors, subsidiaries, and affiliates at any time for any reasons related directly or indirectly to using [employer]. I also agree not to take legal action against [facility] or its affiliates for any injury, death, or damage to property caused by or related to using [employer]. I have read this Waiver and Release of Liability, and I understand the terms used in it and their legal meaning. This Waiver is freely and voluntarily given with the understanding that my right (or any legal minors) to legal action against [place] relating to [employer] is knowingly given up to use these services.   I attest that I read the Ride Waiver and Release of Liability to Benjamin [name], gave [name] the opportunity to ask questions and answered the questions asked (if any). I affirm that Benjamin [name] then provided consent for assistance with transportation.     [name]

## 2023-03-03 ENCOUNTER — Telehealth (HOSPITAL_COMMUNITY): Payer: Self-pay | Admitting: Licensed Clinical Social Worker

## 2023-03-03 NOTE — Telephone Encounter (Signed)
CSW met with pt in clinic to help set up online methods to pay for his bills.  Able to assist in getting account set up for [facility] and app downloaded and logged into for [employer]  Working on getting online portal set up through [employer]- awaiting further assistance from property manager  [name], LCSW Clinical Social Worker Advanced [facility] Failure Clinic Desk#: [phone] Cell#: [phone]

## 2023-03-10 ENCOUNTER — Telehealth: Payer: Self-pay | Admitting: Pulmonary Disease

## 2023-03-10 NOTE — Telephone Encounter (Signed)
Maegan states patient needs to be retested for oxygen. Maegan phone number is [phone].

## 2023-03-12 DIAGNOSIS — F1410 Cocaine abuse, uncomplicated: Secondary | ICD-10-CM | POA: Insufficient documentation

## 2023-03-12 DIAGNOSIS — I10 Essential (primary) hypertension: Secondary | ICD-10-CM | POA: Insufficient documentation

## 2023-03-12 DIAGNOSIS — R0603 Acute respiratory distress: Secondary | ICD-10-CM | POA: Insufficient documentation

## 2023-03-14 ENCOUNTER — Ambulatory Visit: Payer: Medicaid Other | Admitting: Podiatry

## 2023-03-15 ENCOUNTER — Encounter (HOSPITAL_BASED_OUTPATIENT_CLINIC_OR_DEPARTMENT_OTHER): Payer: Medicaid Other

## 2023-03-20 ENCOUNTER — Ambulatory Visit (HOSPITAL_BASED_OUTPATIENT_CLINIC_OR_DEPARTMENT_OTHER): Payer: Medicaid Other | Admitting: Pulmonary Disease

## 2023-03-20 ENCOUNTER — Telehealth (HOSPITAL_COMMUNITY): Payer: Self-pay | Admitting: Licensed Clinical Social Worker

## 2023-03-20 NOTE — Telephone Encounter (Signed)
H&V Care Navigation CSW Progress Note  Clinical Social Worker received call from pt who is currently at rehab with [facility].  Pt needs help paying his [employer] bill as he doesn't have access at facility- CSW able to assist using pt payment method.  No further needs at this time.  Patient is participating in a Managed Medicaid Plan:  Yes  SDOH Screenings   Food Insecurity: Food Insecurity Present (02/24/2023)  Housing: High Risk (02/23/2022)  Transportation Needs: Unmet Transportation Needs (02/22/2023)  Utilities: Not At Risk (01/23/2023)  Depression (PHQ2-9): High Risk (10/04/2022)  Financial Resource Strain: High Risk (02/27/2023)  Tobacco Use: Medium Risk (02/22/2023)    [name], LCSW Clinical Social Worker Advanced [facility] Failure Clinic Desk#: [phone] Cell#: [phone]

## 2023-03-29 ENCOUNTER — Telehealth (HOSPITAL_COMMUNITY): Payer: Self-pay | Admitting: Licensed Clinical Social Worker

## 2023-03-29 ENCOUNTER — Telehealth (HOSPITAL_COMMUNITY): Payer: Self-pay | Admitting: Cardiology

## 2023-03-29 NOTE — Telephone Encounter (Signed)
Pt reports while in rehab-facility team discontinued hydralazine and potassium.  Pt would ike to know if he should restart now that he is back home.   Potassium was d/c'd due to stomach upset, unsure about hydralazine   Previous doses KCL 80 meq daily Hydralazine 50 TID

## 2023-03-29 NOTE — Telephone Encounter (Signed)
H&V Care Navigation CSW Progress Note  Clinical Social Worker received call from pt that he is out of rehab and has paid his rent.  Also states while he was at rehab they took him off his hydralizine and his potassium chloride.  CSW messaged triage team regarding these changes so we could put pt back on if appropriate.  Patient is participating in a Managed Medicaid Plan:  Yes  SDOH Screenings   Food Insecurity: Food Insecurity Present (02/24/2023)  Housing: High Risk (02/23/2022)  Transportation Needs: Unmet Transportation Needs (02/22/2023)  Utilities: Not At Risk (01/23/2023)  Depression (PHQ2-9): High Risk (10/04/2022)  Financial Resource Strain: High Risk (02/27/2023)  Tobacco Use: Medium Risk (02/22/2023)      [name], LCSW Clinical Social Worker Advanced [facility] Failure Clinic Desk#: [phone] Cell#: [phone]

## 2023-03-30 ENCOUNTER — Telehealth (HOSPITAL_COMMUNITY): Payer: Self-pay | Admitting: Licensed Clinical Social Worker

## 2023-03-30 NOTE — Telephone Encounter (Signed)
Pt to return for [place] B/p check and BMeT per [facility] 7/18 @ 1030
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
## 2023-04-05 ENCOUNTER — Telehealth: Payer: Self-pay

## 2023-04-05 ENCOUNTER — Telehealth (HOSPITAL_COMMUNITY): Payer: Self-pay | Admitting: Licensed Clinical Social Worker

## 2023-04-05 NOTE — Telephone Encounter (Signed)
I received a message from Rosetta Posner, LCSW- Heart Failure Clinic stating that [employer] informed them that the patient needs a 6 minute walk test to re-qualify for O2.  He has an appointment with PCP on 05/03/2023 and it can be done at that time.    Results should e faxed to: [phone]

## 2023-04-05 NOTE — Telephone Encounter (Addendum)
H&V Care Navigation CSW Progress Note  Clinical Social Worker received call from [employer] requesting help contacting pt regarding missed pulmonary appt with Dr. Craige Cotta to help get oxygen reapproved by insurance.  CSW called pt and helped reschedule for 9/3 and get on cancellation list.  CSW also contacted pt PCP who has appt with on 8/15 to see if they can do 6 minute walk instead.   RNCM with [facility] and wellness will assist in getting this done at next appt.   SDOH Screenings   Food Insecurity: Food Insecurity Present (02/24/2023)  Housing: High Risk (02/23/2022)  Transportation Needs: Unmet Transportation Needs (02/22/2023)  Utilities: Not At Risk (01/23/2023)  Depression (PHQ2-9): High Risk (10/04/2022)  Financial Resource Strain: High Risk (02/27/2023)  Social Connections: Unknown (03/12/2023)   Received from [facility], [facility]  Tobacco Use: Medium Risk (02/22/2023)    [name], LCSW Clinical Social Worker Advanced [facility] Desk#: [phone] Cell#: [phone]

## 2023-04-06 ENCOUNTER — Telehealth (HOSPITAL_COMMUNITY): Payer: Self-pay | Admitting: Licensed Clinical Social Worker

## 2023-04-06 NOTE — Telephone Encounter (Signed)
Noted  

## 2023-04-07 ENCOUNTER — Ambulatory Visit (HOSPITAL_BASED_OUTPATIENT_CLINIC_OR_DEPARTMENT_OTHER): Payer: MEDICAID | Admitting: Pulmonary Disease

## 2023-04-07 NOTE — Telephone Encounter (Signed)
H&V Care Navigation CSW Progress Note  Clinical Social Worker attempted to help pt connect with Tailored care manager with [facility] to help arrange transport to time sensitive appt tomorrow with pulmonary- unable to locate Aeronautical engineer.  CSW unable to assist with transport as appt is not with Heart and Vascular.  Pt out of food because he lost food stamp card and won't get back for 7-10 days- CSW provided with H&V food bag.   SDOH Screenings   Food Insecurity: Food Insecurity Present (02/24/2023)  Housing: High Risk (02/23/2022)  Transportation Needs: Unmet Transportation Needs (02/22/2023)  Utilities: Not At Risk (01/23/2023)  Depression (PHQ2-9): High Risk (10/04/2022)  Financial Resource Strain: High Risk (02/27/2023)  Social Connections: Unknown (03/12/2023)   Received from [facility], [facility]  Tobacco Use: Medium Risk (02/22/2023)    [name], LCSW Clinical Social Worker [facility] Desk#: [phone] Cell#: [phone]

## 2023-04-11 ENCOUNTER — Other Ambulatory Visit: Payer: Self-pay | Admitting: Nurse Practitioner

## 2023-04-11 DIAGNOSIS — D508 Other iron deficiency anemias: Secondary | ICD-10-CM

## 2023-04-12 ENCOUNTER — Other Ambulatory Visit (HOSPITAL_COMMUNITY): Payer: Self-pay | Admitting: Family Medicine

## 2023-04-12 DIAGNOSIS — E782 Mixed hyperlipidemia: Secondary | ICD-10-CM

## 2023-05-03 ENCOUNTER — Ambulatory Visit: Payer: Medicaid Other | Admitting: Nurse Practitioner

## 2023-05-12 ENCOUNTER — Telehealth: Payer: Self-pay | Admitting: Pulmonary Disease

## 2023-05-12 NOTE — Telephone Encounter (Signed)
Ashly from  [facility] is calling to drop off an NIV order form and needs updated NIV notes as well as downloads from CPAP. His new Medicaid plan requires it.

## 2023-05-23 ENCOUNTER — Encounter (HOSPITAL_BASED_OUTPATIENT_CLINIC_OR_DEPARTMENT_OTHER): Payer: Self-pay | Admitting: Pulmonary Disease

## 2023-05-23 ENCOUNTER — Ambulatory Visit (HOSPITAL_BASED_OUTPATIENT_CLINIC_OR_DEPARTMENT_OTHER): Payer: Medicaid Other | Admitting: Pulmonary Disease

## 2023-05-25 ENCOUNTER — Inpatient Hospital Stay (HOSPITAL_COMMUNITY)
Admission: EM | Admit: 2023-05-25 | Discharge: 2023-05-27 | DRG: 291 | Disposition: A | Discharge status: Home / Self Care | Payer: MEDICAID | Attending: Internal Medicine | Admitting: Internal Medicine

## 2023-05-25 ENCOUNTER — Emergency Department (HOSPITAL_COMMUNITY): Payer: MEDICAID

## 2023-05-25 DIAGNOSIS — Z87891 Personal history of nicotine dependence: Secondary | ICD-10-CM

## 2023-05-25 DIAGNOSIS — Z9981 Dependence on supplemental oxygen: Secondary | ICD-10-CM

## 2023-05-25 DIAGNOSIS — Z794 Long term (current) use of insulin: Secondary | ICD-10-CM

## 2023-05-25 DIAGNOSIS — I5033 Acute on chronic diastolic (congestive) heart failure: Secondary | ICD-10-CM | POA: Diagnosis present

## 2023-05-25 DIAGNOSIS — Z7901 Long term (current) use of anticoagulants: Secondary | ICD-10-CM

## 2023-05-25 DIAGNOSIS — Z5986 Financial insecurity: Secondary | ICD-10-CM

## 2023-05-25 DIAGNOSIS — E1169 Type 2 diabetes mellitus with other specified complication: Secondary | ICD-10-CM | POA: Diagnosis present

## 2023-05-25 DIAGNOSIS — R0789 Other chest pain: Secondary | ICD-10-CM | POA: Diagnosis present

## 2023-05-25 DIAGNOSIS — Z79899 Other long term (current) drug therapy: Secondary | ICD-10-CM

## 2023-05-25 DIAGNOSIS — J9610 Chronic respiratory failure, unspecified whether with hypoxia or hypercapnia: Secondary | ICD-10-CM | POA: Diagnosis present

## 2023-05-25 DIAGNOSIS — E119 Type 2 diabetes mellitus without complications: Secondary | ICD-10-CM | POA: Diagnosis present

## 2023-05-25 DIAGNOSIS — F32A Depression, unspecified: Secondary | ICD-10-CM | POA: Diagnosis present

## 2023-05-25 DIAGNOSIS — Z86711 Personal history of pulmonary embolism: Secondary | ICD-10-CM

## 2023-05-25 DIAGNOSIS — J810 Acute pulmonary edema: Secondary | ICD-10-CM | POA: Diagnosis not present

## 2023-05-25 DIAGNOSIS — R079 Chest pain, unspecified: Secondary | ICD-10-CM | POA: Diagnosis not present

## 2023-05-25 DIAGNOSIS — E876 Hypokalemia: Secondary | ICD-10-CM

## 2023-05-25 DIAGNOSIS — I10 Essential (primary) hypertension: Secondary | ICD-10-CM | POA: Diagnosis present

## 2023-05-25 DIAGNOSIS — N400 Enlarged prostate without lower urinary tract symptoms: Secondary | ICD-10-CM | POA: Diagnosis present

## 2023-05-25 DIAGNOSIS — D509 Iron deficiency anemia, unspecified: Secondary | ICD-10-CM | POA: Diagnosis present

## 2023-05-25 DIAGNOSIS — Z833 Family history of diabetes mellitus: Secondary | ICD-10-CM

## 2023-05-25 DIAGNOSIS — E871 Hypo-osmolality and hyponatremia: Secondary | ICD-10-CM | POA: Diagnosis not present

## 2023-05-25 DIAGNOSIS — I110 Hypertensive heart disease with heart failure: Principal | ICD-10-CM | POA: Diagnosis present

## 2023-05-25 DIAGNOSIS — I700 Atherosclerosis of aorta: Secondary | ICD-10-CM | POA: Diagnosis present

## 2023-05-25 DIAGNOSIS — R7989 Other specified abnormal findings of blood chemistry: Secondary | ICD-10-CM | POA: Diagnosis present

## 2023-05-25 DIAGNOSIS — I480 Paroxysmal atrial fibrillation: Secondary | ICD-10-CM | POA: Diagnosis present

## 2023-05-25 DIAGNOSIS — E669 Obesity, unspecified: Secondary | ICD-10-CM | POA: Diagnosis present

## 2023-05-25 DIAGNOSIS — I2699 Other pulmonary embolism without acute cor pulmonale: Secondary | ICD-10-CM | POA: Diagnosis present

## 2023-05-25 DIAGNOSIS — N5089 Other specified disorders of the male genital organs: Secondary | ICD-10-CM | POA: Diagnosis present

## 2023-05-25 DIAGNOSIS — G4733 Obstructive sleep apnea (adult) (pediatric): Secondary | ICD-10-CM | POA: Diagnosis present

## 2023-05-25 DIAGNOSIS — E785 Hyperlipidemia, unspecified: Secondary | ICD-10-CM | POA: Diagnosis present

## 2023-05-25 DIAGNOSIS — Z1152 Encounter for screening for COVID-19: Secondary | ICD-10-CM

## 2023-05-25 DIAGNOSIS — F1410 Cocaine abuse, uncomplicated: Secondary | ICD-10-CM | POA: Diagnosis present

## 2023-05-25 DIAGNOSIS — Z91199 Patient's noncompliance with other medical treatment and regimen due to unspecified reason: Secondary | ICD-10-CM

## 2023-05-25 DIAGNOSIS — Z6841 Body Mass Index (BMI) 40.0 and over, adult: Secondary | ICD-10-CM

## 2023-05-25 DIAGNOSIS — J811 Chronic pulmonary edema: Secondary | ICD-10-CM | POA: Insufficient documentation

## 2023-05-25 DIAGNOSIS — Z8249 Family history of ischemic heart disease and other diseases of the circulatory system: Secondary | ICD-10-CM

## 2023-05-25 DIAGNOSIS — I509 Heart failure, unspecified: Principal | ICD-10-CM

## 2023-05-25 DIAGNOSIS — Z5982 Transportation insecurity: Secondary | ICD-10-CM

## 2023-05-25 DIAGNOSIS — Z7984 Long term (current) use of oral hypoglycemic drugs: Secondary | ICD-10-CM

## 2023-05-25 DIAGNOSIS — N179 Acute kidney failure, unspecified: Secondary | ICD-10-CM | POA: Diagnosis present

## 2023-05-25 DIAGNOSIS — G4730 Sleep apnea, unspecified: Secondary | ICD-10-CM | POA: Diagnosis present

## 2023-05-25 DIAGNOSIS — J449 Chronic obstructive pulmonary disease, unspecified: Secondary | ICD-10-CM | POA: Diagnosis present

## 2023-05-25 DIAGNOSIS — K219 Gastro-esophageal reflux disease without esophagitis: Secondary | ICD-10-CM | POA: Diagnosis present

## 2023-05-25 LAB — CBC WITH DIFFERENTIAL/PLATELET
Abs Immature Granulocytes: 0.02 10*3/uL (ref 0.00–0.07)
Basophils Absolute: 0 10*3/uL (ref 0.0–0.1)
Basophils Relative: 0 %
Eosinophils Absolute: 0.2 10*3/uL (ref 0.0–0.5)
Eosinophils Relative: 5 %
HCT: 43.6 % (ref 39.0–52.0)
Hemoglobin: 13.2 g/dL (ref 13.0–17.0)
Immature Granulocytes: 0 %
Lymphocytes Relative: 31 %
Lymphs Abs: 1.4 10*3/uL (ref 0.7–4.0)
MCH: 24.1 pg — ABNORMAL LOW (ref 26.0–34.0)
MCHC: 30.3 g/dL (ref 30.0–36.0)
MCV: 79.7 fL — ABNORMAL LOW (ref 80.0–100.0)
Monocytes Absolute: 0.5 10*3/uL (ref 0.1–1.0)
Monocytes Relative: 10 %
Neutro Abs: 2.5 10*3/uL (ref 1.7–7.7)
Neutrophils Relative %: 54 %
Platelets: 380 10*3/uL (ref 150–400)
RBC: 5.47 MIL/uL (ref 4.22–5.81)
RDW: 14.8 % (ref 11.5–15.5)
WBC: 4.6 10*3/uL (ref 4.0–10.5)
nRBC: 0 % (ref 0.0–0.2)

## 2023-05-25 LAB — COMPREHENSIVE METABOLIC PANEL
ALT: 15 U/L (ref 0–44)
AST: 17 U/L (ref 15–41)
Albumin: 3.3 g/dL — ABNORMAL LOW (ref 3.5–5.0)
Alkaline Phosphatase: 43 U/L (ref 38–126)
Anion gap: 6 (ref 5–15)
BUN: 11 mg/dL (ref 6–20)
CO2: 26 mmol/L (ref 22–32)
Calcium: 8.6 mg/dL — ABNORMAL LOW (ref 8.9–10.3)
Chloride: 104 mmol/L (ref 98–111)
Creatinine, Ser: 1.1 mg/dL (ref 0.61–1.24)
GFR, Estimated: >60 mL/min (ref >60)
Glucose, Bld: 97 mg/dL (ref 70–99)
Potassium: 4.1 mmol/L (ref 3.5–5.1)
Sodium: 136 mmol/L (ref 135–145)
Total Bilirubin: 0.4 mg/dL (ref 0.3–1.2)
Total Protein: 7 g/dL (ref 6.5–8.1)

## 2023-05-25 LAB — HEMOGLOBIN A1C
Hgb A1c MFr Bld: 6.7 % — ABNORMAL HIGH (ref 4.8–5.6)
Mean Plasma Glucose: 145.59 mg/dL

## 2023-05-25 LAB — TROPONIN I (HIGH SENSITIVITY)
Troponin I (High Sensitivity): 17 ng/L (ref <18)
Troponin I (High Sensitivity): 15 ng/L (ref <18)

## 2023-05-25 LAB — BRAIN NATRIURETIC PEPTIDE: B Natriuretic Peptide: 30.2 pg/mL (ref 0.0–100.0)

## 2023-05-25 LAB — TSH: TSH: 0.793 u[IU]/mL (ref 0.350–4.500)

## 2023-05-25 LAB — SARS CORONAVIRUS 2 BY RT PCR: SARS Coronavirus 2 by RT PCR: NEGATIVE

## 2023-05-25 LAB — GLUCOSE, CAPILLARY
Glucose-Capillary: 94 mg/dL (ref 70–99)
Glucose-Capillary: 98 mg/dL (ref 70–99)

## 2023-05-25 LAB — HIV ANTIBODY (ROUTINE TESTING W REFLEX): HIV Screen 4th Generation wRfx: NONREACTIVE

## 2023-05-25 MED ORDER — ACETAMINOPHEN 650 MG RE SUPP: 650.0000 mg | Freq: Four times a day (QID) | RECTAL | Status: DC | PRN

## 2023-05-25 MED ORDER — ALBUTEROL SULFATE (2.5 MG/3ML) 0.083% IN NEBU: 2.5000 mg | INHALATION_SOLUTION | Freq: Four times a day (QID) | RESPIRATORY_TRACT | Status: DC | PRN

## 2023-05-25 MED ORDER — IPRATROPIUM-ALBUTEROL 0.5-2.5 (3) MG/3ML IN SOLN
3.0000 mL | Freq: Four times a day (QID) | RESPIRATORY_TRACT | Status: DC
  Administered 2023-05-26 (×2): 3 mL via RESPIRATORY_TRACT
  Filled 2023-05-25 (×3): qty 3

## 2023-05-25 MED ORDER — POLYETHYLENE GLYCOL 3350 17 G PO PACK: 17.0000 g | PACK | Freq: Every day | ORAL | Status: DC | PRN

## 2023-05-25 MED ORDER — BISACODYL 5 MG PO TBEC
10.0000 mg | DELAYED_RELEASE_TABLET | Freq: Every day | ORAL | Status: DC | PRN
  Administered 2023-05-25: 10 mg via ORAL
  Filled 2023-05-25: qty 2

## 2023-05-25 MED ORDER — SODIUM CHLORIDE 0.9% FLUSH
3.0000 mL | Freq: Two times a day (BID) | INTRAVENOUS | Status: DC
  Administered 2023-05-25 – 2023-05-27 (×4): 3 mL via INTRAVENOUS

## 2023-05-25 MED ORDER — FUROSEMIDE 10 MG/ML IJ SOLN
60.0000 mg | Freq: Two times a day (BID) | INTRAMUSCULAR | Status: DC
  Administered 2023-05-26 – 2023-05-27 (×3): 60 mg via INTRAVENOUS
  Filled 2023-05-25 (×3): qty 6

## 2023-05-25 MED ORDER — LOSARTAN POTASSIUM 50 MG PO TABS
50.0000 mg | ORAL_TABLET | Freq: Every day | ORAL | Status: DC
  Administered 2023-05-26 – 2023-05-27 (×2): 50 mg via ORAL
  Filled 2023-05-25 (×2): qty 1

## 2023-05-25 MED ORDER — DILTIAZEM HCL ER COATED BEADS 180 MG PO CP24
360.0000 mg | ORAL_CAPSULE | Freq: Every day | ORAL | Status: DC
  Administered 2023-05-25 – 2023-05-27 (×3): 360 mg via ORAL
  Filled 2023-05-25 (×3): qty 2

## 2023-05-25 MED ORDER — ACETAMINOPHEN 325 MG PO TABS: 650.0000 mg | ORAL_TABLET | Freq: Four times a day (QID) | ORAL | Status: DC | PRN

## 2023-05-25 MED ORDER — HYDRALAZINE HCL 20 MG/ML IJ SOLN: 10.0000 mg | Freq: Four times a day (QID) | INTRAMUSCULAR | Status: DC | PRN

## 2023-05-25 MED ORDER — SPIRONOLACTONE 25 MG PO TABS
50.0000 mg | ORAL_TABLET | Freq: Every day | ORAL | Status: DC
  Administered 2023-05-25 – 2023-05-27 (×3): 50 mg via ORAL
  Filled 2023-05-25 (×3): qty 2

## 2023-05-25 MED ORDER — INSULIN ASPART 100 UNIT/ML IJ SOLN: 0.0000 [IU] | Freq: Three times a day (TID) | INTRAMUSCULAR | Status: DC

## 2023-05-25 MED ORDER — SODIUM CHLORIDE 0.9% FLUSH: 3.0000 mL | INTRAVENOUS | Status: DC | PRN

## 2023-05-25 MED ORDER — SERTRALINE HCL 50 MG PO TABS
25.0000 mg | ORAL_TABLET | Freq: Every day | ORAL | Status: DC
  Administered 2023-05-25 – 2023-05-27 (×3): 25 mg via ORAL
  Filled 2023-05-25 (×3): qty 1

## 2023-05-25 MED ORDER — RIVAROXABAN 10 MG PO TABS
20.0000 mg | ORAL_TABLET | Freq: Every day | ORAL | Status: DC
  Administered 2023-05-25 – 2023-05-26 (×2): 20 mg via ORAL
  Filled 2023-05-25 (×2): qty 2

## 2023-05-25 MED ORDER — HYDRALAZINE HCL 50 MG PO TABS
50.0000 mg | ORAL_TABLET | Freq: Three times a day (TID) | ORAL | Status: DC
  Administered 2023-05-25 – 2023-05-27 (×5): 50 mg via ORAL
  Filled 2023-05-25 (×6): qty 1

## 2023-05-25 MED ORDER — FUROSEMIDE 10 MG/ML IJ SOLN
80.0000 mg | Freq: Once | INTRAMUSCULAR | Status: AC
  Administered 2023-05-25: 80 mg via INTRAVENOUS
  Filled 2023-05-25: qty 8

## 2023-05-25 MED ORDER — ATORVASTATIN CALCIUM 40 MG PO TABS
40.0000 mg | ORAL_TABLET | Freq: Every day | ORAL | Status: DC
  Administered 2023-05-25 – 2023-05-26 (×2): 40 mg via ORAL
  Filled 2023-05-25 (×2): qty 1

## 2023-05-25 MED ORDER — ONDANSETRON HCL 4 MG/2ML IJ SOLN: 4.0000 mg | Freq: Four times a day (QID) | INTRAMUSCULAR | Status: DC | PRN

## 2023-05-25 MED ORDER — IPRATROPIUM-ALBUTEROL 20-100 MCG/ACT IN AERS: 1.0000 | INHALATION_SPRAY | Freq: Four times a day (QID) | RESPIRATORY_TRACT | Status: DC

## 2023-05-25 MED ORDER — TAMSULOSIN HCL 0.4 MG PO CAPS
0.4000 mg | ORAL_CAPSULE | Freq: Every day | ORAL | Status: DC
  Administered 2023-05-25 – 2023-05-26 (×2): 0.4 mg via ORAL
  Filled 2023-05-25 (×2): qty 1

## 2023-05-25 MED ORDER — FUROSEMIDE 10 MG/ML IJ SOLN
100.0000 mg | Freq: Two times a day (BID) | INTRAVENOUS | Status: DC
  Filled 2023-05-25 (×2): qty 10

## 2023-05-25 MED ORDER — SODIUM CHLORIDE 0.9 % IV SOLN: 250.0000 mL | INTRAVENOUS | Status: DC | PRN

## 2023-05-25 MED ORDER — PANTOPRAZOLE SODIUM 40 MG PO TBEC
40.0000 mg | DELAYED_RELEASE_TABLET | Freq: Every day | ORAL | Status: DC
  Administered 2023-05-26 – 2023-05-27 (×2): 40 mg via ORAL
  Filled 2023-05-25 (×2): qty 1

## 2023-05-25 MED ORDER — ONDANSETRON HCL 4 MG PO TABS: 4.0000 mg | ORAL_TABLET | Freq: Four times a day (QID) | ORAL | Status: DC | PRN

## 2023-05-25 MED ORDER — FERROUS SULFATE 325 (65 FE) MG PO TABS
325.0000 mg | ORAL_TABLET | Freq: Every day | ORAL | Status: DC
  Administered 2023-05-26 – 2023-05-27 (×2): 325 mg via ORAL
  Filled 2023-05-25 (×2): qty 1

## 2023-05-25 MED ORDER — OXYCODONE HCL 5 MG PO TABS
5.0000 mg | ORAL_TABLET | ORAL | Status: DC | PRN
  Administered 2023-05-26 – 2023-05-27 (×2): 5 mg via ORAL
  Filled 2023-05-25 (×2): qty 1

## 2023-05-25 NOTE — ED Notes (Signed)
Pt hesitant to take lasix d/t increased risk for void on self. This RN explained need for medication. Pt accepted medicine.

## 2023-05-25 NOTE — H&P (Signed)
[facility] History and Physical   Patient: Benjamin [name] ZOX:[id number]   PCP: Claiborne Rigg, NP DOB: 04-06-1966   DOA: 05/25/2023   DOS: 05/25/2023   DOS: the patient was seen and examined on 05/25/2023  Patient coming from: The patient is coming from Home  Chief Complaint: Chest pressure and shortness of breath  HPI: Benjamin [name] is a 57 y.o. male with Past medical history of COPD, HTN, HLD, dCHF, paroxysmal A-fib, PE on DOAC, DMT2, OSA on CPAP, morbid obesity, polysubstance use, mood disorder, as reviewed from EMR, presented at [facility], ED with complaining of chest pressure for few days.  Patient feels volume overload and swollen in the groin area.  No lower extremity edema.  Denied any chest pain, no palpitations.  No headache or dizziness, no nasal complaints   ED Course: Vital signs afebrile, HR 79, RR 20, BP 121/94, saturating 99% on 2 L oxygen CMP stable, BNP 30 within normal range, troponin negative x 2 CBC within normal range COVID PCR negative CXR: IMPRESSION: Interval increase in interstitial prominence suspicious for mild pulmonary edema. No confluent airspace opacity. EKG sinus rhythm, no significant changes    Review of Systems: as mentioned in the history of present illness.  All other systems reviewed and are negative.  Past Medical History:  Diagnosis Date   A-fib (HCC)    Anemia 05/29/2019   CHF (congestive heart failure) (HCC)    Chronic respiratory failure (HCC)    Diabetes mellitus without complication (HCC)    Dyspnea    Elevated troponin 02/06/2019   Hypertension    Obesity    Pulmonary embolism (HCC)    Rectal bleeding 05/29/2019   Sleep apnea    Past Surgical History:  Procedure Laterality Date   CARDIOVERSION N/A 08/21/2020   Procedure: CARDIOVERSION;  Surgeon: Laurey Morale, MD;  Location: [facility] ENDOSCOPY;  Service: Cardiovascular;  Laterality: N/A;   COLONOSCOPY WITH PROPOFOL Left 02/25/2019   Procedure: COLONOSCOPY WITH  PROPOFOL;  Surgeon: [name], MD;  Location: [facility] ENDOSCOPY;  Service: Endoscopy;  Laterality: Left;   ESOPHAGOGASTRODUODENOSCOPY (EGD) WITH PROPOFOL Left 02/25/2019   Procedure: ESOPHAGOGASTRODUODENOSCOPY (EGD) WITH PROPOFOL;  Surgeon: [name], MD;  Location: [facility] ENDOSCOPY;  Service: Endoscopy;  Laterality: Left;   ESOPHAGOGASTRODUODENOSCOPY (EGD) WITH PROPOFOL N/A 06/12/2019   Procedure: ESOPHAGOGASTRODUODENOSCOPY (EGD) WITH PROPOFOL;  Surgeon: Vida Rigger, MD;  Location: [facility] ENDOSCOPY;  Service: Gastroenterology;  Laterality: N/A;   FLEXIBLE SIGMOIDOSCOPY N/A 06/12/2019   Procedure: FLEXIBLE SIGMOIDOSCOPY;  Surgeon: Vida Rigger, MD;  Location: [facility] ENDOSCOPY;  Service: Gastroenterology;  Laterality: N/A;   HEMORRHOID BANDING  05/2019   NO PAST SURGERIES     RIGHT HEART CATH N/A 04/17/2019   Procedure: RIGHT HEART CATH;  Surgeon: Laurey Morale, MD;  Location: [facility] INVASIVE CV LAB;  Service: Cardiovascular;  Laterality: N/A;   TEE WITHOUT CARDIOVERSION N/A 08/21/2020   Procedure: TRANSESOPHAGEAL ECHOCARDIOGRAM (TEE);  Surgeon: Laurey Morale, MD;  Location: [facility] ENDOSCOPY;  Service: Cardiovascular;  Laterality: N/A;   Social History:  reports that he has quit smoking. He has never used smokeless tobacco. He reports that he does not currently use alcohol. He reports current drug use. Drugs: Marijuana and Cocaine.  No Known Allergies  Family history reviewed and not pertinent Family History  Problem Relation Age of Onset   Hypertension Mother    Diabetes Mother      Prior to Admission medications   Medication Sig Start Date End Date Taking? Authorizing Provider  albuterol (PROVENTIL) (2.5 MG/3ML) 0.083% nebulizer solution INHALE 1 VIAL BY NEBULIZATION EVERY 6 (SIX) HOURS AS NEEDED FOR WHEEZING OR SHORTNESS OF BREATH. 04/12/22   Claiborne Rigg, NP  atorvastatin (LIPITOR) 40 MG tablet TAKE 1 TABLET (40 MG TOTAL) BY MOUTH AT BEDTIME. 04/12/23   Milford, Anderson Malta, FNP  Blood Glucose  Monitoring Suppl (TRUE METRIX METER) w/Device KIT Use as instructed. Check blood glucose level by fingerstick twice per day. 03/18/19   Claiborne Rigg, NP  Blood Pressure Monitor DEVI Please provide patient with insurance approved blood pressure device with L-XL cuff. BMI 55 10/04/19   Claiborne Rigg, NP  COMBIVENT RESPIMAT 20-100 MCG/ACT AERS respimat INHALE 1 PUFF INTO THE LUNGS EVERY 6 (SIX) HOURS. 09/15/22   Coralyn Helling, MD  diltiazem (CARDIZEM CD) 360 MG 24 hr capsule TAKE 1 CAPSULE (360 MG TOTAL) BY MOUTH DAILY. (NOON) 10/18/22   [place]ford, Anderson Malta, FNP  ferrous sulfate (FEROSUL) 325 (65 FE) MG tablet TAKE 1 TABLET (325 MG TOTAL) BY MOUTH DAILY AT NOON. 04/12/23   Hoy Register, MD  glucose blood (TRUE METRIX BLOOD GLUCOSE TEST) test strip Use as instructed. Check blood glucose level by fingerstick twice per day. 02/22/23   Hoy Register, MD  hydrALAZINE (APRESOLINE) 50 MG tablet TAKE 1 TABLET (50 MG TOTAL) BY MOUTH 3 (THREE) TIMES DAILY. (NOON,EVENING,BEDTIME) 08/15/22   Milford, Anderson Malta, FNP  JARDIANCE 10 MG TABS tablet TAKE 1 TABLET (10 MG TOTAL) BY MOUTH DAILY BEFORE BREAKFAST (AM) 10/18/22   Milford, Anderson Malta, FNP  losartan (COZAAR) 50 MG tablet TAKE 1 TABLET (50 MG TOTAL) BY MOUTH DAILY. (NOON) 10/18/22   Milford, [place], FNP  magnesium oxide (MAG-OX) 400 (240 Mg) MG tablet TAKE 1 TABLET (400 MG TOTAL) BY MOUTH 2 (TWO) TIMES DAILY (NOON+BEDTIME) 12/09/22   Milford, Anderson Malta, FNP  metFORMIN (GLUCOPHAGE) 500 MG tablet TAKE 1 TABLET BY MOUTH 2 (TWO) TIMES DAILY WITH A MEAL(NOON+BEDTIME) 04/12/23   Hoy Register, MD  omeprazole (PRILOSEC) 20 MG capsule TAKE 1 CAPSULE 2 (TWO) TIMES DAILY BEFORE A MEAL. TAKE FIRST CAPSULE 30 MIN PRIOR TO EATING OR TAKING OTHER MEDICATIONS. (AM+BEDTIME) 11/11/22   Claiborne Rigg, NP  potassium chloride (KLOR-CON) 10 MEQ tablet TAKE 8 TABLETS (80 MEQ TOTAL) BY MOUTH DAILY. (4 NOON+4 EVENING) 10/18/22   Milford, Anderson Malta, FNP  sertraline (ZOLOFT) 25 MG  tablet Take 1 tablet (25 mg total) by mouth daily. 08/19/22   Karsten Ro, MD  spironolactone (ALDACTONE) 50 MG tablet TAKE 1 TABLET (50 MG TOTAL) BY MOUTH DAILY. (NOON) 10/18/22   Milford, Anderson Malta, FNP  tamsulosin (FLOMAX) 0.4 MG CAPS capsule Take 0.4 mg by mouth daily after supper.    [provider]  torsemide (DEMADEX) 20 MG tablet Take 3 tablets (60 mg total) by mouth 2 (two) times daily. 09/27/22   Milford, Anderson Malta, FNP  TRUEplus Lancets 28G MISC Use as instructed. Check blood glucose level by fingerstick twice per day. 09/22/20   Claiborne Rigg, NP  XARELTO 20 MG TABS tablet TAKE 1 TABLET (20 MG TOTAL) BY MOUTH DAILY WITH SUPPER (BEDTIME) 10/18/22   Jacklynn Ganong, [place]    Physical Exam: Vitals:   05/25/23 1244 05/25/23 1530 05/25/23 1615  BP: (!) 121/94  (!) 136/91  Pulse: 79 67 66  Resp: 20 14 20   Temp: 98.4 F (36.9 C)    TempSrc: Oral    SpO2: 99% 100% 100%    General: alert and oriented to time,  place, and person. Appear in mild distress, affect appropriate Eyes: PERRLA, Conjunctiva normal ENT: Oral Mucosa Clear, moist  Neck: no JVD, no Abnormal Mass Or lumps Cardiovascular: S1 and S2 Present, no Murmur, peripheral pulses symmetrical Respiratory: good respiratory effort, Bilateral Air entry equal and Decreased, no signs of accessory muscle use, mild bibasilar crackles, no significant wheezes Abdomen: Bowel Sound present, Soft and no tenderness, no hernia Skin: no rashes  Extremities: no Pedal edema, no calf tenderness Neurologic: without any new focal findings Gait not checked due to patient safety concerns  Data Reviewed: I have personally reviewed and interpreted labs, imaging as discussed below.  CBC: Recent Labs  Lab 05/25/23 1330  WBC 4.6  NEUTROABS 2.5  HGB 13.2  HCT 43.6  MCV 79.7*  PLT 380   Basic Metabolic Panel: Recent Labs  Lab 05/25/23 1330  NA 136  K 4.1  CL 104  CO2 26  GLUCOSE 97  BUN 11  CREATININE 1.10  CALCIUM 8.6*    GFR: CrCl cannot be calculated (Unknown ideal weight.). Liver Function Tests: Recent Labs  Lab 05/25/23 1330  AST 17  ALT 15  ALKPHOS 43  BILITOT 0.4  PROT 7.0  ALBUMIN 3.3*   No results for input(s): "LIPASE", "AMYLASE" in the last 168 hours. No results for input(s): "AMMONIA" in the last 168 hours. Coagulation Profile: No results for input(s): "INR", "PROTIME" in the last 168 hours. Cardiac Enzymes: No results for input(s): "CKTOTAL", "CKMB", "CKMBINDEX", "TROPONINI" in the last 168 hours. BNP (last 3 results) No results for input(s): "PROBNP" in the last 8760 hours. HbA1C: No results for input(s): "HGBA1C" in the last 72 hours. CBG: No results for input(s): "GLUCAP" in the last 168 hours. Lipid Profile: No results for input(s): "CHOL", "HDL", "LDLCALC", "TRIG", "CHOLHDL", "LDLDIRECT" in the last 72 hours. Thyroid Function Tests: No results for input(s): "TSH", "T4TOTAL", "FREET4", "T3FREE", "THYROIDAB" in the last 72 hours. Anemia Panel: No results for input(s): "VITAMINB12", "FOLATE", "FERRITIN", "TIBC", "IRON", "RETICCTPCT" in the last 72 hours. Urine analysis:    Component Value Date/Time   COLORURINE YELLOW 07/18/2022 1029   APPEARANCEUR CLEAR 07/18/2022 1029   LABSPEC 1.015 07/18/2022 1029   PHURINE 9.0 (H) 07/18/2022 1029   GLUCOSEU NEGATIVE 07/18/2022 1029   HGBUR NEGATIVE 07/18/2022 1029   BILIRUBINUR NEGATIVE 07/18/2022 1029   KETONESUR NEGATIVE 07/18/2022 1029   PROTEINUR NEGATIVE 07/18/2022 1029   NITRITE NEGATIVE 07/18/2022 1029   LEUKOCYTESUR SMALL (A) 07/18/2022 1029    Radiological Exams on Admission: DG Chest Portable 1 View  Result Date: 05/25/2023 CLINICAL DATA:  Shortness of breath. EXAM: PORTABLE CHEST 1 VIEW COMPARISON:  Radiographs 02/16/2022 and 07/20/2021.  CT 02/26/2019. FINDINGS: 1255 hours. The heart size and mediastinal contours are stable with mild cardiomegaly and aortic atherosclerosis. Diffuse interstitial prominence has  increased from the prior studies, suspicious for mild pulmonary edema. No confluent airspace opacity, pneumothorax or significant pleural effusion identified. No acute osseous findings are seen. Telemetry leads overlie the chest. IMPRESSION: Interval increase in interstitial prominence suspicious for mild pulmonary edema. No confluent airspace opacity. Electronically Signed   By: Carey Bullocks M.D.   On: 05/25/2023 14:46    Echocardiogram: Pending  I reviewed all nursing notes, pharmacy notes, vitals, pertinent old records.  Assessment/Plan Principal Problem:   Pulmonary edema   Pulmonary edema, could be due to CHF? BNP within normal range! Chest x-ray shows pulmonary edema Patient was given Lasix 80 mg one-time dose in the ED Started Lasix 60 mg IV twice daily  Monitor renal functions, and urine output daily Daily weight, intake and output Follow repeat 2D echocardiogram Consider cardiology consult if needed   Paroxysmal A-fib, HTN, HLD Continued home meds Lipitor 40 mg, hydralazine 50 3 times daily, losartan 50 daily, spironolactone 50 daily Cardizem CD 360 mg p.o. daily, Xarelto 20 mg p.o. daily Continue to monitor on telemetry  COPD, chronic respiratory failure on 2 L oxygen at baseline Continue supplemental O2 inhalation Continued inhalers   BPH, continue Flomax Depression, continue Zoloft Iron deficiency, continued oral iron supplement History of GI bleed in the past, no active issues.  Monitor H&H OSA, continue CPAP Morbid obesity BMI 44, calorie restricted diet and daily exercise advised.  Lifestyle modification discussed.   Nutrition: Cardiac and Carb modified diet DVT Prophylaxis: Therapeutic Anticoagulation with Xarelto  Advance goals of care discussion: Full code    Consults: Consider cardiology consult tomorrow a.m. if needed  Family Communication: family was not present at bedside, at the time of interview.  Opportunity was given to ask question and all  questions were answered satisfactorily.  Disposition: Admitted as observation, telemetry unit. Likely to be discharged home, in 1-2 days .  I have discussed plan of care as described above with RN and patient/family.  Severity of Illness: The appropriate patient status for this patient is OBSERVATION. Observation status is judged to be reasonable and necessary in order to provide the required intensity of service to ensure the patient's safety. The patient's presenting symptoms, physical exam findings, and initial radiographic and laboratory data in the context of their medical condition is felt to place them at decreased risk for further clinical deterioration. Furthermore, it is anticipated that the patient will be medically stable for discharge from the hospital within 2 midnights of admission.    Author: Gillis Santa, MD [facility] 05/25/2023 5:32 PM   To reach On-call, see care teams to locate the attending and reach out to them via [email or web address]. If 7PM-7AM, please contact night-coverage If you still have difficulty reaching the attending provider, please page the [facility] (Director on Call) for [facility] on amion for assistance.

## 2023-05-25 NOTE — ED Provider Notes (Signed)
[place] EMERGENCY DEPARTMENT AT [facility] Provider Note   CSN: [id number] Arrival date & time: 05/25/23  1234     History  Chief Complaint  Patient presents with   Shortness of Breath    Chest Pain    Benjamin [name] is a 57 y.o. male.  HPI     57yo male with history of atrial fibrillation, diastolic CHF, htn, type II DM, history of PE on xarelto, OSA, GERD, chronically elevated troponin , cocaine abuse, on home O2, who presents with concern for shortness of breath.   Reports he is here with feeling symptoms of his congestive heart failure, and wanting to present before things get significantly worse.  Reports over the last 2 to 3 days he has had increasing scrotal swelling, bilateral lower extremity edema, abdominal swelling and shortness of breath.  Reports that the symptoms are consistent with his congestive heart failure, and typically he has scrotal swelling before anything else.  Reports he has been adherent to his medication.  He has been trying to cut down on cocaine but admits to occasional use.  He reports a sensation of chest pressure that feels similar to when he has had volume overload in the past.  Last night reports he had difficulty sleeping due to dyspnea when he was laying down.  Denies fevers, cough, asymmetric leg swelling   Past Medical History:  Diagnosis Date   A-fib (HCC)    Anemia 05/29/2019   CHF (congestive heart failure) (HCC)    Chronic respiratory failure (HCC)    Diabetes mellitus without complication (HCC)    Dyspnea    Elevated troponin 02/06/2019   Hypertension    Obesity    Pulmonary embolism (HCC)    Rectal bleeding 05/29/2019   Sleep apnea     Home Medications Prior to Admission medications   Medication Sig Start Date End Date Taking? Authorizing Provider  albuterol (PROVENTIL) (2.5 MG/3ML) 0.083% nebulizer solution INHALE 1 VIAL BY NEBULIZATION EVERY 6 (SIX) HOURS AS NEEDED FOR WHEEZING OR SHORTNESS OF BREATH. Patient  taking differently: Take 2.5 mg by nebulization every 6 (six) hours as needed for shortness of breath. 04/12/22  Yes Claiborne Rigg, NP  atorvastatin (LIPITOR) 40 MG tablet TAKE 1 TABLET (40 MG TOTAL) BY MOUTH AT BEDTIME. 04/12/23  Yes Milford, Jessica M, FNP  COMBIVENT RESPIMAT 20-100 MCG/ACT AERS respimat INHALE 1 PUFF INTO THE LUNGS EVERY 6 (SIX) HOURS. Patient taking differently: Inhale 1 puff into the lungs every 6 (six) hours as needed for shortness of breath. 09/15/22  Yes Coralyn Helling, MD  diltiazem (CARDIZEM CD) 360 MG 24 hr capsule TAKE 1 CAPSULE (360 MG TOTAL) BY MOUTH DAILY. (NOON) 10/18/22  Yes Milford, Raymond, FNP  ferrous sulfate (FEROSUL) 325 (65 FE) MG tablet TAKE 1 TABLET (325 MG TOTAL) BY MOUTH DAILY AT NOON. 04/12/23  Yes Hoy Register, MD  hydrALAZINE (APRESOLINE) 50 MG tablet TAKE 1 TABLET (50 MG TOTAL) BY MOUTH 3 (THREE) TIMES DAILY. (NOON,EVENING,BEDTIME) 08/15/22  Yes Milford, Jessica M, FNP  JARDIANCE 10 MG TABS tablet TAKE 1 TABLET (10 MG TOTAL) BY MOUTH DAILY BEFORE BREAKFAST (AM) 10/18/22  Yes Milford, Jessica M, FNP  losartan (COZAAR) 50 MG tablet TAKE 1 TABLET (50 MG TOTAL) BY MOUTH DAILY. (NOON) 10/18/22  Yes Milford, [place], [place]  spironolactone (ALDACTONE) 50 MG tablet TAKE 1 TABLET (50 MG TOTAL) BY MOUTH DAILY. (NOON) Patient taking differently: Take 50 mg by mouth daily. 10/18/22  Yes Milford, [place],  FNP  tamsulosin (FLOMAX) 0.4 MG CAPS capsule Take 0.4 mg by mouth daily after supper.   Yes [provider]  torsemide (DEMADEX) 20 MG tablet Take 3 tablets (60 mg total) by mouth 2 (two) times daily. 09/27/22  Yes Milford, Anderson [place], FNP  XARELTO 20 MG TABS tablet TAKE 1 TABLET (20 MG TOTAL) BY MOUTH DAILY WITH SUPPER (BEDTIME) 10/18/22  Yes Milford, Anderson [place], [place]  Blood Glucose Monitoring Suppl (TRUE METRIX METER) w/Device KIT Use as instructed. Check blood glucose level by fingerstick twice per day. 03/18/19   Claiborne Rigg, NP  Blood Pressure Monitor  DEVI Please provide patient with insurance approved blood pressure device with L-XL cuff. BMI 55 10/04/19   Claiborne Rigg, NP  glucose blood (TRUE METRIX BLOOD GLUCOSE TEST) test strip Use as instructed. Check blood glucose level by fingerstick twice per day. 02/22/23   Hoy Register, MD  magnesium oxide (MAG-OX) 400 (240 Mg) MG tablet TAKE 1 TABLET (400 MG TOTAL) BY MOUTH 2 (TWO) TIMES DAILY (NOON+BEDTIME) 12/09/22   Milford, Anderson [place], FNP  metFORMIN (GLUCOPHAGE) 500 MG tablet TAKE 1 TABLET BY MOUTH 2 (TWO) TIMES DAILY WITH A MEAL(NOON+BEDTIME) 04/12/23   Hoy Register, MD  omeprazole (PRILOSEC) 20 MG capsule TAKE 1 CAPSULE 2 (TWO) TIMES DAILY BEFORE A MEAL. TAKE FIRST CAPSULE 30 MIN PRIOR TO EATING OR TAKING OTHER MEDICATIONS. (AM+BEDTIME) 11/11/22   Claiborne Rigg, NP  potassium chloride (KLOR-CON) 10 MEQ tablet TAKE 8 TABLETS (80 MEQ TOTAL) BY MOUTH DAILY. (4 NOON+4 EVENING) 10/18/22   Milford, Anderson [place], FNP  sertraline (ZOLOFT) 25 MG tablet Take 1 tablet (25 mg total) by mouth daily. 08/19/22   Karsten Ro, MD  TRUEplus Lancets 28G MISC Use as instructed. Check blood glucose level by fingerstick twice per day. 09/22/20   Claiborne Rigg, NP      Allergies    Patient has no known allergies.    Review of Systems   Review of Systems  Physical Exam Updated Vital Signs BP 131/72 (BP Location: Right Arm)   Pulse 81   Temp 98.5 F (36.9 C) (Oral)   Resp 17   Ht 5\' 11"  (1.803 m)   Wt (!) 144.3 kg   SpO2 96%   BMI 44.37 kg/m  Physical Exam Vitals and nursing note reviewed.  Constitutional:      General: He is not in acute distress.    Appearance: He is well-developed. He is not diaphoretic.  HENT:     Head: Normocephalic and atraumatic.  Eyes:     Conjunctiva/sclera: Conjunctivae normal.  Neck:     Vascular: JVD present.  Cardiovascular:     Rate and Rhythm: Normal rate and regular rhythm.     Heart sounds: Normal heart sounds. No murmur heard.    No friction rub. No gallop.   Pulmonary:     Effort: Pulmonary effort is normal. Tachypnea present. No respiratory distress.     Breath sounds: Decreased breath sounds present. No wheezing or rales.  Abdominal:     General: There is no distension.     Palpations: Abdomen is soft.     Tenderness: There is no abdominal tenderness. There is no guarding.  Musculoskeletal:     Cervical back: Normal range of motion.     Right lower leg: Edema present.     Left lower leg: Edema present.  Skin:    General: Skin is warm and dry.  Neurological:     Mental Status: He  is alert and oriented to person, place, and time.     ED Results / Procedures / Treatments   Labs (all labs ordered are listed, but only abnormal results are displayed) Labs Reviewed  CBC WITH DIFFERENTIAL/PLATELET - Abnormal; Notable for the following components:      Result Value   MCV 79.7 (*)    MCH 24.1 (*)    All other components within normal limits  COMPREHENSIVE METABOLIC PANEL - Abnormal; Notable for the following components:   Calcium 8.6 (*)    Albumin 3.3 (*)    All other components within normal limits  CBC - Abnormal; Notable for the following components:   MCV 79.3 (*)    MCH 24.2 (*)    All other components within normal limits  BASIC METABOLIC PANEL - Abnormal; Notable for the following components:   Glucose, Bld 103 (*)    All other components within normal limits  HEMOGLOBIN A1C - Abnormal; Notable for the following components:   Hgb A1c MFr Bld 6.7 (*)    All other components within normal limits  GLUCOSE, CAPILLARY - Abnormal; Notable for the following components:   Glucose-Capillary 107 (*)    All other components within normal limits  SARS CORONAVIRUS 2 BY RT PCR  BRAIN NATRIURETIC PEPTIDE  HIV ANTIBODY (ROUTINE TESTING W REFLEX)  PHOSPHORUS  MAGNESIUM  TSH  GLUCOSE, CAPILLARY  GLUCOSE, CAPILLARY  RAPID URINE DRUG SCREEN, HOSP PERFORMED  TROPONIN I (HIGH SENSITIVITY)  TROPONIN I (HIGH SENSITIVITY)    EKG EKG  Interpretation Date/Time:  Thursday May 25 2023 12:43:51 EDT Ventricular Rate:  81 PR Interval:  145 QRS Duration:  99 QT Interval:  359 QTC Calculation: 417 R Axis:   67  Text Interpretation: Sinus rhythm No significant change since last tracing Confirmed by Alvira Monday ([id number]) on 05/25/2023 2:27:55 PM  Radiology DG Chest Portable 1 View  Result Date: 05/25/2023 CLINICAL DATA:  Shortness of breath. EXAM: PORTABLE CHEST 1 VIEW COMPARISON:  Radiographs 02/16/2022 and 07/20/2021.  CT 02/26/2019. FINDINGS: 1255 hours. The heart size and mediastinal contours are stable with mild cardiomegaly and aortic atherosclerosis. Diffuse interstitial prominence has increased from the prior studies, suspicious for mild pulmonary edema. No confluent airspace opacity, pneumothorax or significant pleural effusion identified. No acute osseous findings are seen. Telemetry leads overlie the chest. IMPRESSION: Interval increase in interstitial prominence suspicious for mild pulmonary edema. No confluent airspace opacity. Electronically Signed   By: Carey Bullocks M.D.   On: 05/25/2023 14:46    Procedures Procedures    Medications Ordered in ED Medications  sodium chloride flush (NS) 0.9 % injection 3 mL (3 mLs Intravenous Given 05/25/23 2150)  sodium chloride flush (NS) 0.9 % injection 3 mL (has no administration in time range)  0.9 %  sodium chloride infusion (has no administration in time range)  acetaminophen (TYLENOL) tablet 650 mg (has no administration in time range)    Or  acetaminophen (TYLENOL) suppository 650 mg (has no administration in time range)  oxyCODONE (Oxy IR/ROXICODONE) immediate release tablet 5 mg (has no administration in time range)  polyethylene glycol (MIRALAX / GLYCOLAX) packet 17 g (has no administration in time range)  bisacodyl (DULCOLAX) EC tablet 10 mg (10 mg Oral Given 05/25/23 2206)  ondansetron (ZOFRAN) tablet 4 mg (has no administration in time range)    Or   ondansetron (ZOFRAN) injection 4 mg (has no administration in time range)  hydrALAZINE (APRESOLINE) injection 10 mg (has no administration in time range)  furosemide (  LASIX) injection 60 mg (has no administration in time range)  albuterol (PROVENTIL) (2.5 MG/3ML) 0.083% nebulizer solution 2.5 mg (has no administration in time range)  atorvastatin (LIPITOR) tablet 40 mg (40 mg Oral Given 05/25/23 2148)  diltiazem (CARDIZEM CD) 24 hr capsule 360 mg (360 mg Oral Given 05/25/23 2147)  ferrous sulfate tablet 325 mg (has no administration in time range)  hydrALAZINE (APRESOLINE) tablet 50 mg (50 mg Oral Given 05/26/23 0621)  losartan (COZAAR) tablet 50 mg (has no administration in time range)  pantoprazole (PROTONIX) EC tablet 40 mg (has no administration in time range)  sertraline (ZOLOFT) tablet 25 mg (25 mg Oral Given 05/25/23 2147)  spironolactone (ALDACTONE) tablet 50 mg (50 mg Oral Given 05/25/23 2148)  tamsulosin (FLOMAX) capsule 0.4 mg (0.4 mg Oral Given 05/25/23 2148)  rivaroxaban (XARELTO) tablet 20 mg (20 mg Oral Given 05/25/23 2148)  insulin aspart (novoLOG) injection 0-20 Units (0 Units Subcutaneous Not Given 05/26/23 0638)  ipratropium-albuterol (DUONEB) 0.5-2.5 (3) MG/3ML nebulizer solution 3 mL (3 mLs Nebulization Patient Refused/Not Given 05/26/23 0140)  furosemide (LASIX) injection 80 mg (80 mg Intravenous Given 05/25/23 1629)    ED Course/ Medical Decision Making/ A&P Clinical Course as of 05/26/23 0701  Thu May 25, 2023  1502 Assumed care from Dr Dalene Seltzer. 57 yo M with hx of CHF on baseline 2L [place] who presented with SOB and chest pain likely 2/2 CHF exacerbation. Getting lasix and will talk to cards. Having testicular swelling. Awaiting 2nd trop, covid, and cards consult. Still uses cocaine. [RP]  1610 Dr Scharlene Gloss from cardiology consulted and will follow the patient.  Recommends hospitalist admission. [RP]  1638 Dr Lucianne Muss from hospitalist to admit the patient.  Second troponin returned and was WNL.   COVID-negative. [RP]    Clinical Course User Index [RP] Rondel Baton, MD                                  57yo male with history of atrial fibrillation, diastolic CHF, htn, type II DM, history of PE on xarelto, OSA, GERD, chronically elevated troponin , cocaine abuse, on home O2, who presents with concern for shortness of breath.   Differential diagnosis for dyspnea includes ACS, PE, COPD exacerbation, CHF exacerbation, anemia, pneumonia, viral etiology such as COVID 19 infection, metabolic abnormality.    Chest x-ray was done and personally about interpreted by me shows pulmonary edema.   EKG was evaluated by me which showed no acute ST changes.    Labs completed and personally about interpreted by me show no clinically significant anemia, electrolyte abnormalities.  Troponin is within normal limits, and BNP is also normal although may be low in the setting of obesity.  Clinically by symptoms, chest x-ray suspect congestive heart failure exacerbation.  Ordered 80 mg of IV Lasix, consulted cardiology who follows him. Signed out with Cardiology consult pending.           Final Clinical Impression(s) / ED Diagnoses Final diagnoses:  Acute on chronic congestive heart failure, unspecified heart failure type [facility])    Rx / DC Orders ED Discharge Orders     None         Alvira Monday, MD 05/26/23 [phone]

## 2023-05-25 NOTE — ED Notes (Signed)
ED TO INPATIENT HANDOFF REPORT  ED Nurse Name and Phone #: Osvaldo Shipper RN [phone]  S Name/Age/Gender Benjamin [name] 57 y.o. male Room/Bed: 043C/043C  Code Status   Code Status: Prior  Home/SNF/Other Home Patient oriented to: self, place, time, and situation Is this baseline? Yes   Triage Complete: Triage complete  Chief Complaint chf  Triage Note PT BIB EMS for shortness of breath and chest pain, chronic CHF patient and is compliant with medications, crackles in the bases, and hasn't been able to urinate in 2 days. May need social work for a lower level apartment.    324 Aspirin 1 Nitroglycerin SL 117/71 HR 84 99% on 2L CBG 106    Allergies No Known Allergies  Level of Care/Admitting Diagnosis ED Disposition     ED Disposition  Admit   Condition  --   Comment  The patient appears reasonably stabilized for admission considering the current resources, flow, and capabilities available in the ED at this time, and I doubt any other [facility] requiring further screening and/or treatment in the ED prior to admission is  present.          B Medical/Surgery History Past Medical History:  Diagnosis Date   A-fib (HCC)    Anemia 05/29/2019   CHF (congestive heart failure) (HCC)    Chronic respiratory failure (HCC)    Diabetes mellitus without complication (HCC)    Dyspnea    Elevated troponin 02/06/2019   Hypertension    Obesity    Pulmonary embolism (HCC)    Rectal bleeding 05/29/2019   Sleep apnea    Past Surgical History:  Procedure Laterality Date   CARDIOVERSION N/A 08/21/2020   Procedure: CARDIOVERSION;  Surgeon: Laurey Morale, MD;  Location: [facility] ENDOSCOPY;  Service: Cardiovascular;  Laterality: N/A;   COLONOSCOPY WITH PROPOFOL Left 02/25/2019   Procedure: COLONOSCOPY WITH PROPOFOL;  Surgeon: [name], MD;  [facility];  Service: Endoscopy;  Laterality: Left;   ESOPHAGOGASTRODUODENOSCOPY (EGD) WITH PROPOFOL Left 02/25/2019   Procedure:  ESOPHAGOGASTRODUODENOSCOPY (EGD) WITH PROPOFOL;  Surgeon: [name], MD;  Location: [facility] ENDOSCOPY;  Service: Endoscopy;  Laterality: Left;   ESOPHAGOGASTRODUODENOSCOPY (EGD) WITH PROPOFOL N/A 06/12/2019   Procedure: ESOPHAGOGASTRODUODENOSCOPY (EGD) WITH PROPOFOL;  Surgeon: Vida Rigger, MD;  Location: [facility] ENDOSCOPY;  Service: Gastroenterology;  Laterality: N/A;   FLEXIBLE SIGMOIDOSCOPY N/A 06/12/2019   Procedure: FLEXIBLE SIGMOIDOSCOPY;  Surgeon: Vida Rigger, MD;  Location: [facility] ENDOSCOPY;  Service: Gastroenterology;  Laterality: N/A;   HEMORRHOID BANDING  05/2019   NO PAST SURGERIES     RIGHT HEART CATH N/A 04/17/2019   Procedure: RIGHT HEART CATH;  Surgeon: Laurey Morale, MD;  Location: [facility] INVASIVE CV LAB;  Service: Cardiovascular;  Laterality: N/A;   TEE WITHOUT CARDIOVERSION N/A 08/21/2020   Procedure: TRANSESOPHAGEAL ECHOCARDIOGRAM (TEE);  Surgeon: Laurey Morale, MD;  Location: [facility] ENDOSCOPY;  Service: Cardiovascular;  Laterality: N/A;     A IV Location/Drains/Wounds Patient Lines/Drains/Airways Status     Active Line/Drains/Airways     Name Placement date Placement time Site Days   Peripheral IV 05/25/23 20 G Anterior;Right Wrist 05/25/23  1345  Wrist  less than 1            Intake/Output Last 24 hours No intake or output data in the 24 hours ending 05/25/23 1649  Labs/Imaging Results for orders placed or performed during the hospital encounter of 05/25/23 (from the past 48 hour(s))  CBC with Differential     Status: Abnormal   Collection Time:  05/25/23  1:30 PM  Result Value Ref Range   WBC 4.6 4.0 - 10.5 K/uL   RBC 5.47 4.22 - 5.81 MIL/uL   Hemoglobin 13.2 13.0 - 17.0 g/dL   HCT 11.9 14.7 - 82.9 %   MCV 79.7 (L) 80.0 - 100.0 fL   MCH 24.1 (L) 26.0 - 34.0 pg   MCHC 30.3 30.0 - 36.0 g/dL   RDW 56.2 13.0 - 86.5 %   Platelets 380 150 - 400 K/uL   nRBC 0.0 0.0 - 0.2 %   Neutrophils Relative % 54 %   Neutro Abs 2.5 1.7 - 7.7 K/uL   Lymphocytes Relative 31 %    Lymphs Abs 1.4 0.7 - 4.0 K/uL   Monocytes Relative 10 %   Monocytes Absolute 0.5 0.1 - 1.0 K/uL   Eosinophils Relative 5 %   Eosinophils Absolute 0.2 0.0 - 0.5 K/uL   Basophils Relative 0 %   Basophils Absolute 0.0 0.0 - 0.1 K/uL   Immature Granulocytes 0 %   Abs Immature Granulocytes 0.02 0.00 - 0.07 K/uL    Comment: Performed at [facility], [address] [place], [place] [address]  Comprehensive metabolic panel     Status: Abnormal   Collection Time: 05/25/23  1:30 PM  Result Value Ref Range   Sodium 136 135 - 145 mmol/L   Potassium 4.1 3.5 - 5.1 mmol/L   Chloride 104 98 - 111 mmol/L   CO2 26 22 - 32 mmol/L   Glucose, Bld 97 70 - 99 mg/dL    Comment: Glucose reference range applies only to samples taken after fasting for at least 8 hours.   BUN 11 6 - 20 mg/dL   Creatinine, Ser 6.29 0.61 - 1.24 mg/dL   Calcium 8.6 (L) 8.9 - 10.3 mg/dL   Total Protein 7.0 6.5 - 8.1 g/dL   Albumin 3.3 (L) 3.5 - 5.0 g/dL   AST 17 15 - 41 U/L   ALT 15 0 - 44 U/L   Alkaline Phosphatase 43 38 - 126 U/L   Total Bilirubin 0.4 0.3 - 1.2 mg/dL   GFR, Estimated >52 >84 mL/min    Comment: (NOTE) Calculated using the CKD-EPI Creatinine Equation (2021)    Anion gap 6 5 - 15    Comment: Performed at [facility], [address] [place], [place] [address]  Brain natriuretic peptide     Status: None   Collection Time: 05/25/23  1:30 PM  Result Value Ref Range   B Natriuretic Peptide 30.2 0.0 - 100.0 pg/mL    Comment: Performed at [facility], [address] [place], [place] [address]  Troponin I (High Sensitivity)     Status: None   Collection Time: 05/25/23  1:30 PM  Result Value Ref Range   Troponin I (High Sensitivity) 17 <18 ng/L    Comment: (NOTE) Elevated high sensitivity troponin I (hsTnI) values and significant  changes across serial measurements may suggest ACS but many other  chronic and acute conditions are known to elevate hsTnI results.  Refer to the "Links"  section for chest pain algorithms and additional  guidance. Performed at [facility], [address] [place], [place] [address]   SARS Coronavirus 2 by RT PCR (hospital order, performed in [facility] hospital lab) *cepheid single result test* Anterior Nasal Swab     Status: None   Collection Time: 05/25/23  1:30 PM   Specimen: Anterior Nasal Swab  Result Value  Ref Range   SARS Coronavirus 2 by RT PCR NEGATIVE NEGATIVE    Comment: Performed at [facility], [address], [place], [place] [address]   DG Chest Portable 1 View  Result Date: 05/25/2023 CLINICAL DATA:  Shortness of breath. EXAM: PORTABLE CHEST 1 VIEW COMPARISON:  Radiographs 02/16/2022 and 07/20/2021.  CT 02/26/2019. FINDINGS: 1255 hours. The heart size and mediastinal contours are stable with mild cardiomegaly and aortic atherosclerosis. Diffuse interstitial prominence has increased from the prior studies, suspicious for mild pulmonary edema. No confluent airspace opacity, pneumothorax or significant pleural effusion identified. No acute osseous findings are seen. Telemetry leads overlie the chest. IMPRESSION: Interval increase in interstitial prominence suspicious for mild pulmonary edema. No confluent airspace opacity. Electronically Signed   By: Carey Bullocks M.D.   On: 05/25/2023 14:46    Pending Labs Unresulted Labs (From admission, onward)    None       Vitals/Pain Today's Vitals   05/25/23 1244 05/25/23 1530 05/25/23 1615  BP: (!) 121/94  (!) 136/91  Pulse: 79 67 66  Resp: 20 14 20   Temp: 98.4 F (36.9 C)    TempSrc: Oral    SpO2: 99% 100% 100%    Isolation Precautions Airborne and Contact precautions  Medications Medications  furosemide (LASIX) 100 mg in dextrose 5 % 50 mL IVPB (has no administration in time range)  furosemide (LASIX) injection 80 mg (80 mg Intravenous Given 05/25/23 1629)    Mobility walks with device     Focused Assessments Cardiac Assessment Handoff:  Cardiac  Rhythm: Normal sinus rhythm Lab Results  Component Value Date   TROPONINI 0.05 (HH) 03/12/2019   Lab Results  Component Value Date   DDIMER 0.98 (H) 02/24/2020   Does the Patient currently have chest pain? No    R Recommendations: See Admitting Provider Note  Report given to:   Additional Notes:

## 2023-05-25 NOTE — ED Notes (Signed)
Provider at bedside

## 2023-05-25 NOTE — ED Notes (Signed)
PT well appearing upon transport to floor.

## 2023-05-25 NOTE — Consult Note (Signed)
Cardiology Consultation   Patient ID: Benjamin [name] MRN: [id number]; DOB: 02-14-1966  Admit date: 05/25/2023 Date of Consult: 05/25/2023  PCP:  Claiborne Rigg, NP   [facility] Providers Cardiologist:  Dietrich Pates, MD     Patient Profile:   Benjamin [name] is a 57 y.o. male with a hx of diastolic CHF, paroxysmal atrial fibrillation, PE, OSA, hypertension, obesity, diabetes, cocaine who is being seen 05/25/2023 for the evaluation of CHF at the request of Dr. Dalene Seltzer.  History of Present Illness:   [name] is a 57 year old male with past medical history noted above.  He is followed by the advanced heart failure clinic.  Initially admitted 01/2019 with dyspnea and found to have a PE.  Has been anticoagulated with Xarelto since that time.  Readmitted a month later with bright red blood per rectum and evaluated by GI.  EGD/colonoscopy showed hemorrhoidal bleeding.  Readmitted 06/2019 with acute on chronic diastolic CHF treated with IV Lasix.  Echo showed LVEF of 65 to 70%, suspected significant RV dysfunction.  He was placed on torsemide 100 mg twice daily, lisinopril 5 mg daily.  Admitted 08/2020 for new onset atrial fibrillation with RVR in the setting of noncompliance with EtOH and cocaine use.  Was placed on Cardizem and underwent successful DCCV to sinus rhythm.  Underwent Lexiscan Cardiolite with a fixed apical defect felt to be artifact with no ischemia.  Seen in the clinic 10/2021 and reported being off of his medications for 1 week and noted to be volume overloaded.  He was restarted on medications along with metolazone 2.5 mg x 2 days.  He was reenrolled in para medicine.  Last seen in the clinic on 02/2023 reported feeling well overall.  Was wearing 3 L of oxygen with activity and compliant with CPAP at night.  Weight was noted at 151 kg.  He was continued on torsemide 60 mg twice daily with potassium supplement, Jardiance 10 mg daily, spironolactone 50 mg daily,  losartan 50 mg daily, hydralazine 50 mg 3 times daily.  He was unable to receive a CardioMEMS as he was a Medicaid patient.  Reported he had cut back on his cocaine use but still using.  Plan for follow-up in 3 to 4 months with updated echo.  Presented to the ED on 9/5 with complaints of increased shortness of breath and chest discomfort.  States he has been compliant with his medications.  Last use of cocaine (snorts) was 6 days ago.  Also complains of increased scrotal swelling.  Labs in the ED showed sodium 136, potassium 4.1, creatinine 1.1, albumin 3.3, BNP 30, high-sensitivity troponin 17, WBC 4.6, hemoglobin 13.2.  Chest x-ray with mild pulmonary edema.  EKG shows sinus rhythm, 81 bpm, nonspecific changes.  Cardiology asked to evaluate.  Past Medical History:  Diagnosis Date   A-fib [facility])    Anemia 05/29/2019   CHF (congestive heart failure) (HCC)    Chronic respiratory failure (HCC)    Diabetes mellitus without complication (HCC)    Dyspnea    Elevated troponin 02/06/2019   Hypertension    Obesity    Pulmonary embolism (HCC)    Rectal bleeding 05/29/2019   Sleep apnea     Past Surgical History:  Procedure Laterality Date   CARDIOVERSION N/A 08/21/2020   Procedure: CARDIOVERSION;  Surgeon: Laurey Morale, MD;  Location: [facility] ENDOSCOPY;  Service: Cardiovascular;  Laterality: N/A;   COLONOSCOPY WITH PROPOFOL Left 02/25/2019   Procedure: COLONOSCOPY WITH PROPOFOL;  Surgeon: [name], MD;  Location: [facility] ENDOSCOPY;  Service: Endoscopy;  Laterality: Left;   ESOPHAGOGASTRODUODENOSCOPY (EGD) WITH PROPOFOL Left 02/25/2019   Procedure: ESOPHAGOGASTRODUODENOSCOPY (EGD) WITH PROPOFOL;  Surgeon: [name], MD;  Location: [facility] ENDOSCOPY;  Service: Endoscopy;  Laterality: Left;   ESOPHAGOGASTRODUODENOSCOPY (EGD) WITH PROPOFOL N/A 06/12/2019   Procedure: ESOPHAGOGASTRODUODENOSCOPY (EGD) WITH PROPOFOL;  Surgeon: Vida Rigger, MD;  Location: [facility] ENDOSCOPY;  Service: Gastroenterology;   Laterality: N/A;   FLEXIBLE SIGMOIDOSCOPY N/A 06/12/2019   Procedure: FLEXIBLE SIGMOIDOSCOPY;  Surgeon: Vida Rigger, MD;  Location: [facility] ENDOSCOPY;  Service: Gastroenterology;  Laterality: N/A;   HEMORRHOID BANDING  05/2019   NO PAST SURGERIES     RIGHT HEART CATH N/A 04/17/2019   Procedure: RIGHT HEART CATH;  Surgeon: Laurey Morale, MD;  Location: [facility] INVASIVE CV LAB;  Service: Cardiovascular;  Laterality: N/A;   TEE WITHOUT CARDIOVERSION N/A 08/21/2020   Procedure: TRANSESOPHAGEAL ECHOCARDIOGRAM (TEE);  Surgeon: Laurey Morale, MD;  Location: [facility] ENDOSCOPY;  Service: Cardiovascular;  Laterality: N/A;     Home Medications:  Prior to Admission medications   Medication Sig Start Date End Date Taking? Authorizing Provider  albuterol (PROVENTIL) (2.5 MG/3ML) 0.083% nebulizer solution INHALE 1 VIAL BY NEBULIZATION EVERY 6 (SIX) HOURS AS NEEDED FOR WHEEZING OR SHORTNESS OF BREATH. 04/12/22   Claiborne Rigg, NP  atorvastatin (LIPITOR) 40 MG tablet TAKE 1 TABLET (40 MG TOTAL) BY MOUTH AT BEDTIME. 04/12/23   Milford, Anderson Malta, FNP  Blood Glucose Monitoring Suppl (TRUE METRIX METER) w/Device KIT Use as instructed. Check blood glucose level by fingerstick twice per day. 03/18/19   Claiborne Rigg, NP  Blood Pressure Monitor DEVI Please provide patient with insurance approved blood pressure device with L-XL cuff. BMI 55 10/04/19   Claiborne Rigg, NP  COMBIVENT RESPIMAT 20-100 MCG/ACT AERS respimat INHALE 1 PUFF INTO THE LUNGS EVERY 6 (SIX) HOURS. 09/15/22   Coralyn Helling, MD  diltiazem (CARDIZEM CD) 360 MG 24 hr capsule TAKE 1 CAPSULE (360 MG TOTAL) BY MOUTH DAILY. (NOON) 10/18/22   Milford, Anderson Malta, FNP  ferrous sulfate (FEROSUL) 325 (65 FE) MG tablet TAKE 1 TABLET (325 MG TOTAL) BY MOUTH DAILY AT NOON. 04/12/23   Hoy Register, MD  glucose blood (TRUE METRIX BLOOD GLUCOSE TEST) test strip Use as instructed. Check blood glucose level by fingerstick twice per day. 02/22/23   Hoy Register, MD   hydrALAZINE (APRESOLINE) 50 MG tablet TAKE 1 TABLET (50 MG TOTAL) BY MOUTH 3 (THREE) TIMES DAILY. (NOON,EVENING,BEDTIME) 08/15/22   Milford, Anderson Malta, FNP  JARDIANCE 10 MG TABS tablet TAKE 1 TABLET (10 MG TOTAL) BY MOUTH DAILY BEFORE BREAKFAST (AM) 10/18/22   Milford, Anderson Malta, FNP  losartan (COZAAR) 50 MG tablet TAKE 1 TABLET (50 MG TOTAL) BY MOUTH DAILY. (NOON) 10/18/22   Milford, [place], FNP  magnesium oxide (MAG-OX) 400 (240 Mg) MG tablet TAKE 1 TABLET (400 MG TOTAL) BY MOUTH 2 (TWO) TIMES DAILY (NOON+BEDTIME) 12/09/22   Milford, Anderson Malta, FNP  metFORMIN (GLUCOPHAGE) 500 MG tablet TAKE 1 TABLET BY MOUTH 2 (TWO) TIMES DAILY WITH A MEAL(NOON+BEDTIME) 04/12/23   Hoy Register, MD  omeprazole (PRILOSEC) 20 MG capsule TAKE 1 CAPSULE 2 (TWO) TIMES DAILY BEFORE A MEAL. TAKE FIRST CAPSULE 30 MIN PRIOR TO EATING OR TAKING OTHER MEDICATIONS. (AM+BEDTIME) 11/11/22   Claiborne Rigg, NP  potassium chloride (KLOR-CON) 10 MEQ tablet TAKE 8 TABLETS (80 MEQ TOTAL) BY MOUTH DAILY. (4 NOON+4 EVENING) 10/18/22   Milford, Anderson Malta, FNP  sertraline (ZOLOFT) 25 MG tablet Take 1 tablet (25 mg total) by mouth daily. 08/19/22   Karsten Ro, MD  spironolactone (ALDACTONE) 50 MG tablet TAKE 1 TABLET (50 MG TOTAL) BY MOUTH DAILY. (NOON) 10/18/22   Milford, Anderson Malta, FNP  tamsulosin (FLOMAX) 0.4 MG CAPS capsule Take 0.4 mg by mouth daily after supper.    [provider]  torsemide (DEMADEX) 20 MG tablet Take 3 tablets (60 mg total) by mouth 2 (two) times daily. 09/27/22   Milford, Anderson Malta, FNP  TRUEplus Lancets 28G MISC Use as instructed. Check blood glucose level by fingerstick twice per day. 09/22/20   Claiborne Rigg, NP  XARELTO 20 MG TABS tablet TAKE 1 TABLET (20 MG TOTAL) BY MOUTH DAILY WITH SUPPER (BEDTIME) 10/18/22   Milford, Anderson Malta, FNP    Inpatient Medications: Scheduled Meds:  furosemide  80 mg Intravenous Once   Continuous Infusions:  PRN Meds:   Allergies:   No Known Allergies  Social  History:   Social History   Socioeconomic History   Marital status: Single    Spouse name: Not on file   Number of children: Not on file   Years of education: Not on file   Highest education level: Not on file  Occupational History   Not on file  Tobacco Use   Smoking status: Former   Smokeless tobacco: Never   Tobacco comments:    smoked weed in the past  Vaping Use   Vaping status: Never Used  Substance and Sexual Activity   Alcohol use: Not Currently   Drug use: Yes    Types: Marijuana, Cocaine   Sexual activity: Not Currently  Other Topics Concern   Not on file  Social History Narrative   Not on file   Social Determinants of Health   Financial Resource Strain: High Risk (02/27/2023)   Overall Financial Resource Strain (CARDIA)    Difficulty of Paying Living Expenses: Hard  Food Insecurity: Food Insecurity Present (02/24/2023)   Hunger Vital Sign    Worried About Running Out of Food in the Last Year: Sometimes true    Ran Out of Food in the Last Year: Never true  Transportation Needs: Unmet Transportation Needs (02/22/2023)   PRAPARE - Administrator, Civil Service (Medical): Yes    Lack of Transportation (Non-Medical): Yes  Physical Activity: Not on file  Stress: Not on file  Social Connections: Unknown (03/12/2023)   Received from [facility]   Social Network    Social Network: Not on file  Intimate Partner Violence: Not At Risk (03/12/2023)   Received from [facility]   HITS    Over the last 12 months how often did your partner physically hurt you?: 1    Over the last 12 months how often did your partner insult you or talk down to you?: 1    Over the last 12 months how often did your partner threaten you with physical harm?: 1    Over the last 12 months how often did your partner scream or curse at you?: 1    Family History:    Family History  Problem Relation Age of Onset   Hypertension Mother    Diabetes Mother       ROS:  Please see the history of present illness.   All other ROS reviewed and negative.     Physical Exam/Data:   Vitals:   05/25/23 1244  BP: (!) 121/94  Pulse:  79  Resp: 20  Temp: 98.4 F (36.9 C)  TempSrc: Oral  SpO2: 99%   No intake or output data in the 24 hours ending 05/25/23 1549    02/22/2023    3:25 PM 10/04/2022    2:12 PM 09/27/2022    1:35 PM  Last 3 Weights  Weight (lbs) 333 lb 331 lb 6.4 oz 334 lb  Weight (kg) 151.048 kg 150.322 kg 151.501 kg     There is no height or weight on file to calculate BMI.  General:  Morbid obesity, on [place] @3L   HEENT: normal Neck: no JVD Vascular: No carotid bruits; Distal pulses 2+ bilaterally Cardiac:  normal S1, S2; RRR; no murmur  Lungs:  clear to auscultation bilaterally, no wheezing, rhonchi or rales  Abd: soft, nontender, no hepatomegaly  Ext: no edema Musculoskeletal:  No deformities, BUE and BLE strength normal and equal Skin: warm and dry  Neuro:  CNs 2-12 intact, no focal abnormalities noted Psych:  Normal affect   EKG:  The EKG was personally reviewed and demonstrates: Sinus rhythm, 81 bpm, nonspecific changes Telemetry:  Telemetry was personally reviewed and demonstrates: Sinus rhythm, 70s  Relevant CV Studies:  Echo: 01/2022  IMPRESSIONS     1. Left ventricular ejection fraction, by estimation, is 60 to 65%. The  left ventricle has normal function. The left ventricle has no regional  wall motion abnormalities. There is moderate left ventricular hypertrophy.  Left ventricular diastolic  parameters are consistent with Grade I diastolic dysfunction (impaired  relaxation). LV mid-cavity gradient to 29 mmHg peak.   2. Right ventricular systolic function is normal. The right ventricular  size is normal. Tricuspid regurgitation signal is inadequate for assessing  PA pressure.   3. The mitral valve is normal in structure. No evidence of mitral valve  regurgitation. No evidence of mitral stenosis.   4. The  aortic valve is tricuspid. Aortic valve regurgitation is not  visualized. No aortic stenosis is present.   5. The inferior vena cava is normal in size with greater than 50%  respiratory variability, suggesting right atrial pressure of 3 mmHg.   FINDINGS   Left Ventricle: Left ventricular ejection fraction, by estimation, is 60  to 65%. The left ventricle has normal function. The left ventricle has no  regional wall motion abnormalities. Definity contrast agent was given IV  to delineate the left ventricular   endocardial borders. The left ventricular internal cavity size was normal  in size. There is moderate left ventricular hypertrophy. Left ventricular  diastolic parameters are consistent with Grade I diastolic dysfunction  (impaired relaxation).   Right Ventricle: The right ventricular size is normal. No increase in  right ventricular wall thickness. Right ventricular systolic function is  normal. Tricuspid regurgitation signal is inadequate for assessing PA  pressure.   Left Atrium: Left atrial size was normal in size.   Right Atrium: Right atrial size was normal in size.   Pericardium: There is no evidence of pericardial effusion.   Mitral Valve: The mitral valve is normal in structure. No evidence of  mitral valve regurgitation. No evidence of mitral valve stenosis.   Tricuspid Valve: The tricuspid valve is normal in structure. Tricuspid  valve regurgitation is not demonstrated.   Aortic Valve: The aortic valve is tricuspid. Aortic valve regurgitation is  not visualized. No aortic stenosis is present.   Pulmonic Valve: The pulmonic valve was normal in structure. Pulmonic valve  regurgitation is not visualized.   Aorta: The aortic root is  normal in size and structure.   Venous: The inferior vena cava is normal in size with greater than 50%  respiratory variability, suggesting right atrial pressure of 3 mmHg.   IAS/Shunts: No atrial level shunt detected by color flow  Doppler.       Laboratory Data:  High Sensitivity Troponin:   Recent Labs  Lab 05/25/23 1330  TROPONINIHS 17     Chemistry Recent Labs  Lab 05/25/23 1330  NA 136  K 4.1  CL 104  CO2 26  GLUCOSE 97  BUN 11  CREATININE 1.10  CALCIUM 8.6*  GFRNONAA >60  ANIONGAP 6    Recent Labs  Lab 05/25/23 1330  PROT 7.0  ALBUMIN 3.3*  AST 17  ALT 15  ALKPHOS 43  BILITOT 0.4   Lipids No results for input(s): "CHOL", "TRIG", "HDL", "LABVLDL", "LDLCALC", "CHOLHDL" in the last 168 hours.  Hematology Recent Labs  Lab 05/25/23 1330  WBC 4.6  RBC 5.47  HGB 13.2  HCT 43.6  MCV 79.7*  MCH 24.1*  MCHC 30.3  RDW 14.8  PLT 380   Thyroid No results for input(s): "TSH", "FREET4" in the last 168 hours.  BNP Recent Labs  Lab 05/25/23 1330  BNP 30.2    DDimer No results for input(s): "DDIMER" in the last 168 hours.   Radiology/Studies:  DG Chest Portable 1 View  Result Date: 05/25/2023 CLINICAL DATA:  Shortness of breath. EXAM: PORTABLE CHEST 1 VIEW COMPARISON:  Radiographs 02/16/2022 and 07/20/2021.  CT 02/26/2019. FINDINGS: 1255 hours. The heart size and mediastinal contours are stable with mild cardiomegaly and aortic atherosclerosis. Diffuse interstitial prominence has increased from the prior studies, suspicious for mild pulmonary edema. No confluent airspace opacity, pneumothorax or significant pleural effusion identified. No acute osseous findings are seen. Telemetry leads overlie the chest. IMPRESSION: Interval increase in interstitial prominence suspicious for mild pulmonary edema. No confluent airspace opacity. Electronically Signed   By: Carey Bullocks M.D.   On: 05/25/2023 14:46     Assessment and Plan:   Benjamin [name] is a 57 y.o. male with a hx of diastolic CHF, paroxysmal atrial fibrillation, PE, OSA, hypertension, obesity, diabetes, cocaine who is being seen 05/25/2023 for the evaluation of CHF at the request of Dr. Dalene Seltzer.  Acute on chronic diastolic  CHF -- Last echo 01/2022 with LVEF of 60 to 65%, moderate LVH, normal RV.  Reports compliance with home medications.  Complains of scrotal swelling over the past couple days.  Creatinine 1.1, on torsemide 60 mg twice daily PTA.  BNP is only 30 but can be inaccurate given his large body habitus.  Chest x-ray with mild pulmonary edema. Received IV lasix 80mg  x1 in the ED -- Start Lasix 100 mg twice daily -- check echo to ensure not decline in LVEF or change in RV -- Continue Jardiance, spironolactone 50 mg daily, losartan 50 mg daily, hydralazine 50 mg 3 times daily  Chest pain -- sounds atypical in nature, hsTn 17. EKG without ischemic changes -- hx of cocaine use, check UDS -- as above, check echo  Paroxysmal atrial fibrillation -- Currently in sinus rhythm -- Continue home diltiazem 360 mg daily, Xarelto 20 mg daily  History of PE -- On Xarelto  Hypertension -- controlled -- as above, continue home regimen  Diabetes -- Most recent hemoglobin A1c 6.3  Cocaine abuse -- Reports last use was 6 days ago -- Check UDS  Risk Assessment/Risk Scores:   New York Heart Association (NYHA) Functional Class NYHA  Class II  CHA2DS2-VASc Score = 3  This indicates a 3.2% annual risk of stroke. The patient's score is based upon: CHF History: 1 HTN History: 1 Diabetes History: 1 Stroke History: 0 Vascular Disease History: 0 Age Score: 0 Gender Score: 0  For questions or updates, please contact [facility] Please consult [email or web address] for contact info under    Signed, Laverda Page, NP  05/25/2023 3:49 PM

## 2023-05-25 NOTE — ED Triage Notes (Signed)
PT BIB EMS for shortness of breath and chest pain, chronic CHF patient and is compliant with medications, crackles in the bases, and hasn't been able to urinate in 2 days. May need social work for a lower level apartment.    324 Aspirin 1 Nitroglycerin SL 117/71 HR 84 99% on 2L CBG 106

## 2023-05-25 NOTE — ED Provider Notes (Signed)
  Physical Exam  BP (!) 146/67 (BP Location: Right Arm)   Pulse 63   Temp 98.2 F (36.8 C) (Oral)   Resp 20   Ht 5\' 11"  (1.803 m)   Wt (!) 143.4 kg   SpO2 99%   BMI 44.09 kg/m   Physical Exam  Procedures  Procedures  ED Course / MDM   Clinical Course as of 05/25/23 2246  Thu May 25, 2023  1502 Assumed care from Dr Dalene Seltzer. 57 yo M with hx of CHF on baseline 2L [place] who presented with SOB and chest pain likely 2/2 CHF exacerbation. Getting lasix and will talk to cards. Having testicular swelling. Awaiting 2nd trop, covid, and cards consult. Still uses cocaine. [RP]  1610 Dr Scharlene Gloss from cardiology consulted and will follow the patient.  Recommends hospitalist admission. [RP]  1638 Dr Lucianne Muss from hospitalist to admit the patient.  Second troponin returned and was WNL.  COVID-negative. [RP]    Clinical Course User Index [RP] Rondel Baton, MD   Medical Decision Making Amount and/or Complexity of Data Reviewed Labs: ordered. Radiology: ordered.  Risk Prescription drug management. Decision regarding hospitalization.     Rondel Baton, MD 05/25/23 [phone]

## 2023-05-26 ENCOUNTER — Encounter (HOSPITAL_COMMUNITY): Payer: Self-pay | Admitting: Student

## 2023-05-26 ENCOUNTER — Observation Stay (HOSPITAL_COMMUNITY): Payer: MEDICAID

## 2023-05-26 ENCOUNTER — Other Ambulatory Visit: Payer: Self-pay

## 2023-05-26 DIAGNOSIS — E1169 Type 2 diabetes mellitus with other specified complication: Secondary | ICD-10-CM

## 2023-05-26 DIAGNOSIS — E66813 Obesity, class 3: Secondary | ICD-10-CM

## 2023-05-26 DIAGNOSIS — Z6841 Body Mass Index (BMI) 40.0 and over, adult: Secondary | ICD-10-CM | POA: Diagnosis not present

## 2023-05-26 DIAGNOSIS — D509 Iron deficiency anemia, unspecified: Secondary | ICD-10-CM

## 2023-05-26 DIAGNOSIS — I480 Paroxysmal atrial fibrillation: Secondary | ICD-10-CM | POA: Diagnosis present

## 2023-05-26 DIAGNOSIS — Z86711 Personal history of pulmonary embolism: Secondary | ICD-10-CM | POA: Diagnosis not present

## 2023-05-26 DIAGNOSIS — I700 Atherosclerosis of aorta: Secondary | ICD-10-CM | POA: Diagnosis present

## 2023-05-26 DIAGNOSIS — Z1152 Encounter for screening for COVID-19: Secondary | ICD-10-CM | POA: Diagnosis not present

## 2023-05-26 DIAGNOSIS — E785 Hyperlipidemia, unspecified: Secondary | ICD-10-CM | POA: Diagnosis present

## 2023-05-26 DIAGNOSIS — J449 Chronic obstructive pulmonary disease, unspecified: Secondary | ICD-10-CM | POA: Diagnosis present

## 2023-05-26 DIAGNOSIS — I2699 Other pulmonary embolism without acute cor pulmonale: Secondary | ICD-10-CM

## 2023-05-26 DIAGNOSIS — I10 Essential (primary) hypertension: Secondary | ICD-10-CM | POA: Diagnosis not present

## 2023-05-26 DIAGNOSIS — E871 Hypo-osmolality and hyponatremia: Secondary | ICD-10-CM | POA: Diagnosis not present

## 2023-05-26 DIAGNOSIS — E119 Type 2 diabetes mellitus without complications: Secondary | ICD-10-CM | POA: Diagnosis present

## 2023-05-26 DIAGNOSIS — Z794 Long term (current) use of insulin: Secondary | ICD-10-CM | POA: Diagnosis not present

## 2023-05-26 DIAGNOSIS — Z79899 Other long term (current) drug therapy: Secondary | ICD-10-CM | POA: Diagnosis not present

## 2023-05-26 DIAGNOSIS — Z8249 Family history of ischemic heart disease and other diseases of the circulatory system: Secondary | ICD-10-CM | POA: Diagnosis not present

## 2023-05-26 DIAGNOSIS — I5033 Acute on chronic diastolic (congestive) heart failure: Secondary | ICD-10-CM | POA: Diagnosis present

## 2023-05-26 DIAGNOSIS — F32A Depression, unspecified: Secondary | ICD-10-CM | POA: Diagnosis present

## 2023-05-26 DIAGNOSIS — R0602 Shortness of breath: Secondary | ICD-10-CM | POA: Diagnosis present

## 2023-05-26 DIAGNOSIS — E669 Obesity, unspecified: Secondary | ICD-10-CM

## 2023-05-26 DIAGNOSIS — G4733 Obstructive sleep apnea (adult) (pediatric): Secondary | ICD-10-CM | POA: Diagnosis present

## 2023-05-26 DIAGNOSIS — J9610 Chronic respiratory failure, unspecified whether with hypoxia or hypercapnia: Secondary | ICD-10-CM | POA: Diagnosis present

## 2023-05-26 DIAGNOSIS — N400 Enlarged prostate without lower urinary tract symptoms: Secondary | ICD-10-CM | POA: Diagnosis present

## 2023-05-26 DIAGNOSIS — N179 Acute kidney failure, unspecified: Secondary | ICD-10-CM | POA: Diagnosis present

## 2023-05-26 DIAGNOSIS — Z7901 Long term (current) use of anticoagulants: Secondary | ICD-10-CM | POA: Diagnosis not present

## 2023-05-26 DIAGNOSIS — Z87891 Personal history of nicotine dependence: Secondary | ICD-10-CM | POA: Diagnosis not present

## 2023-05-26 DIAGNOSIS — F1410 Cocaine abuse, uncomplicated: Secondary | ICD-10-CM | POA: Diagnosis present

## 2023-05-26 DIAGNOSIS — I110 Hypertensive heart disease with heart failure: Secondary | ICD-10-CM | POA: Diagnosis present

## 2023-05-26 LAB — CBC
HCT: 44.5 % (ref 39.0–52.0)
Hemoglobin: 13.6 g/dL (ref 13.0–17.0)
MCH: 24.2 pg — ABNORMAL LOW (ref 26.0–34.0)
MCHC: 30.6 g/dL (ref 30.0–36.0)
MCV: 79.3 fL — ABNORMAL LOW (ref 80.0–100.0)
Platelets: 370 10*3/uL (ref 150–400)
RBC: 5.61 MIL/uL (ref 4.22–5.81)
RDW: 14.9 % (ref 11.5–15.5)
WBC: 6 10*3/uL (ref 4.0–10.5)
nRBC: 0 % (ref 0.0–0.2)

## 2023-05-26 LAB — ECHOCARDIOGRAM COMPLETE
Area-P 1/2: 2.24 cm2
Calc EF: 71.1 %
Height: 71 in
MV VTI: 3.77 cm2
S' Lateral: 2.6 cm
Single Plane A2C EF: 65.5 %
Single Plane A4C EF: 74.7 %
Weight: 5089.98 [oz_av]

## 2023-05-26 LAB — RAPID URINE DRUG SCREEN, HOSP PERFORMED
Amphetamines: NOT DETECTED
Barbiturates: NOT DETECTED
Benzodiazepines: NOT DETECTED
Cocaine: POSITIVE — AB
Opiates: NOT DETECTED
Tetrahydrocannabinol: NOT DETECTED

## 2023-05-26 LAB — MAGNESIUM: Magnesium: 2 mg/dL (ref 1.7–2.4)

## 2023-05-26 LAB — BASIC METABOLIC PANEL
Anion gap: 11 (ref 5–15)
BUN: 16 mg/dL (ref 6–20)
CO2: 26 mmol/L (ref 22–32)
Calcium: 8.9 mg/dL (ref 8.9–10.3)
Chloride: 100 mmol/L (ref 98–111)
Creatinine, Ser: 1.13 mg/dL (ref 0.61–1.24)
GFR, Estimated: >60 mL/min (ref >60)
Glucose, Bld: 103 mg/dL — ABNORMAL HIGH (ref 70–99)
Potassium: 3.7 mmol/L (ref 3.5–5.1)
Sodium: 137 mmol/L (ref 135–145)

## 2023-05-26 LAB — GLUCOSE, CAPILLARY
Glucose-Capillary: 107 mg/dL — ABNORMAL HIGH (ref 70–99)
Glucose-Capillary: 98 mg/dL (ref 70–99)
Glucose-Capillary: 108 mg/dL — ABNORMAL HIGH (ref 70–99)
Glucose-Capillary: 103 mg/dL — ABNORMAL HIGH (ref 70–99)

## 2023-05-26 LAB — PHOSPHORUS: Phosphorus: 3.4 mg/dL (ref 2.5–4.6)

## 2023-05-26 MED ORDER — POTASSIUM CHLORIDE CRYS ER 20 MEQ PO TBCR
40.0000 meq | EXTENDED_RELEASE_TABLET | ORAL | Status: DC
  Administered 2023-05-26: 40 meq via ORAL
  Filled 2023-05-26: qty 2

## 2023-05-26 MED ORDER — PERFLUTREN LIPID MICROSPHERE
1.0000 mL | INTRAVENOUS | Status: AC | PRN
  Administered 2023-05-26: 2 mL via INTRAVENOUS

## 2023-05-26 MED ORDER — POTASSIUM CHLORIDE CRYS ER 20 MEQ PO TBCR: 20.0000 meq | EXTENDED_RELEASE_TABLET | Freq: Every day | ORAL | Status: DC

## 2023-05-26 MED ORDER — ALUM & MAG HYDROXIDE-SIMETH 200-200-20 MG/5ML PO SUSP
30.0000 mL | Freq: Once | ORAL | Status: AC
  Administered 2023-05-26: 30 mL via ORAL
  Filled 2023-05-26: qty 30

## 2023-05-26 MED ORDER — EMPAGLIFLOZIN 10 MG PO TABS
10.0000 mg | ORAL_TABLET | Freq: Every day | ORAL | Status: DC
  Administered 2023-05-26 – 2023-05-27 (×2): 10 mg via ORAL
  Filled 2023-05-26 (×2): qty 1

## 2023-05-26 MED ORDER — ISOSORBIDE MONONITRATE ER 30 MG PO TB24
30.0000 mg | ORAL_TABLET | Freq: Every day | ORAL | Status: DC
  Administered 2023-05-26 – 2023-05-27 (×2): 30 mg via ORAL
  Filled 2023-05-26 (×2): qty 1

## 2023-05-26 NOTE — Assessment & Plan Note (Signed)
Cell count has been stable.  

## 2023-05-26 NOTE — Progress Notes (Signed)
   05/25/23 2309  BiPAP/CPAP/SIPAP  BiPAP/CPAP/SIPAP Pt Type Adult  Reason BIPAP/CPAP not in use Other(comment) (refused)

## 2023-05-26 NOTE — Progress Notes (Signed)
Cardiology Progress Note  Patient ID: Benjamin [name] MRN: [id number] DOB: [birth year] Date of Encounter: 05/26/2023  Primary Cardiologist: Dietrich Pates, MD  Subjective   Chief Complaint: Chest pain  HPI: Reports ongoing chest pain.  Sleeping prior to my examination.  Still short of breath.  Good diuresis.  ROS:  All other ROS reviewed and negative. Pertinent positives noted in the HPI.     Inpatient Medications  Scheduled Meds:  atorvastatin  40 mg Oral QHS   diltiazem  360 mg Oral Daily   ferrous sulfate  325 mg Oral Q breakfast   furosemide  60 mg Intravenous BID   hydrALAZINE  50 mg Oral Q8H   insulin aspart  0-20 Units Subcutaneous TID WC   ipratropium-albuterol  3 mL Nebulization Q6H   losartan  50 mg Oral Daily   pantoprazole  40 mg Oral Daily   rivaroxaban  20 mg Oral Q supper   sertraline  25 mg Oral Daily   sodium chloride flush  3 mL Intravenous Q12H   spironolactone  50 mg Oral Daily   tamsulosin  0.4 mg Oral QPC supper   Continuous Infusions:  sodium chloride     PRN Meds: sodium chloride, acetaminophen **OR** acetaminophen, albuterol, bisacodyl, hydrALAZINE, ondansetron **OR** ondansetron (ZOFRAN) IV, oxyCODONE, polyethylene glycol, sodium chloride flush   Vital Signs   Vitals:   05/25/23 1945 05/25/23 2345 05/26/23 0438 05/26/23 0728  BP: (!) 146/67 (!) 149/85 131/72 (!) 143/92  Pulse: 63 77 81 82  Resp: 20 19 17 19   Temp: 98.2 F (36.8 C) 98.2 F (36.8 C) 98.5 F (36.9 C) 98.5 F (36.9 C)  TempSrc: Oral Oral Oral Oral  SpO2: 99% 93% 96% 96%  Weight:   (!) 144.3 kg   Height:        Intake/Output Summary (Last 24 hours) at 05/26/2023 0952 Last data filed at 05/26/2023 0344 Gross per 24 hour  Intake 480 ml  Output 1620 ml  Net -1140 ml      05/26/2023    4:38 AM 05/25/2023    6:15 PM 02/22/2023    3:25 PM  Last 3 Weights  Weight (lbs) 318 lb 2 oz 316 lb 2.2 oz 333 lb  Weight (kg) 144.3 kg 143.4 kg 151.048 kg      Telemetry  Overnight  telemetry shows SR 70s, which I personally reviewed.    Physical Exam   Vitals:   05/25/23 1945 05/25/23 2345 05/26/23 0438 05/26/23 0728  BP: (!) 146/67 (!) 149/85 131/72 (!) 143/92  Pulse: 63 77 81 82  Resp: 20 19 17 19   Temp: 98.2 F (36.8 C) 98.2 F (36.8 C) 98.5 F (36.9 C) 98.5 F (36.9 C)  TempSrc: Oral Oral Oral Oral  SpO2: 99% 93% 96% 96%  Weight:   (!) 144.3 kg   Height:        Intake/Output Summary (Last 24 hours) at 05/26/2023 0952 Last data filed at 05/26/2023 0344 Gross per 24 hour  Intake 480 ml  Output 1620 ml  Net -1140 ml       05/26/2023    4:38 AM 05/25/2023    6:15 PM 02/22/2023    3:25 PM  Last 3 Weights  Weight (lbs) 318 lb 2 oz 316 lb 2.2 oz 333 lb  Weight (kg) 144.3 kg 143.4 kg 151.048 kg    Body mass index is 44.37 kg/m.  General: Well nourished, well developed, in no acute distress Head: Atraumatic, normal size  Eyes: PEERLA, EOMI  Neck: Supple, JVD assessment difficult due to neck adiposity Endocrine: No thryomegaly Cardiac: Normal S1, S2; RRR; no murmurs, rubs, or gallops Lungs: Rales at the lung bases Abd: Soft, nontender, no hepatomegaly  Ext: Trace edema Musculoskeletal: No deformities, BUE and BLE strength normal and equal Skin: Warm and dry, no rashes   Neuro: Alert and oriented to person, place, time, and situation, CNII-XII grossly intact, no focal deficits  Psych: Normal mood and affect   Labs  High Sensitivity Troponin:   Recent Labs  Lab 05/25/23 1330 05/25/23 1552  TROPONINIHS 17 15     Cardiac EnzymesNo results for input(s): "TROPONINI" in the last 168 hours. No results for input(s): "TROPIPOC" in the last 168 hours.  Chemistry Recent Labs  Lab 05/25/23 1330 05/26/23 0341  NA 136 137  K 4.1 3.7  CL 104 100  CO2 26 26  GLUCOSE 97 103*  BUN 11 16  CREATININE 1.10 1.13  CALCIUM 8.6* 8.9  PROT 7.0  --   ALBUMIN 3.3*  --   AST 17  --   ALT 15  --   ALKPHOS 43  --   BILITOT 0.4  --   GFRNONAA >60 >60  ANIONGAP 6  11    Hematology Recent Labs  Lab 05/25/23 1330 05/26/23 0341  WBC 4.6 6.0  RBC 5.47 5.61  HGB 13.2 13.6  HCT 43.6 44.5  MCV 79.7* 79.3*  MCH 24.1* 24.2*  MCHC 30.3 30.6  RDW 14.8 14.9  PLT 380 370   BNP Recent Labs  Lab 05/25/23 1330  BNP 30.2    DDimer No results for input(s): "DDIMER" in the last 168 hours.   Radiology  DG Chest Portable 1 View  Result Date: 05/25/2023 CLINICAL DATA:  Shortness of breath. EXAM: PORTABLE CHEST 1 VIEW COMPARISON:  Radiographs 02/16/2022 and 07/20/2021.  CT 02/26/2019. FINDINGS: 1255 hours. The heart size and mediastinal contours are stable with mild cardiomegaly and aortic atherosclerosis. Diffuse interstitial prominence has increased from the prior studies, suspicious for mild pulmonary edema. No confluent airspace opacity, pneumothorax or significant pleural effusion identified. No acute osseous findings are seen. Telemetry leads overlie the chest. IMPRESSION: Interval increase in interstitial prominence suspicious for mild pulmonary edema. No confluent airspace opacity. Electronically Signed   By: Carey Bullocks M.D.   On: 05/25/2023 14:46    Cardiac Studies  Echo pending  Patient Profile  57 year old male with history of diastolic heart failure, morbid obesity, paroxysmal atrial fibrillation, OSA, PE, hypertension, diabetes, ongoing cocaine abuse who was admitted to the hospital on 05/25/2023 for acute on chronic diastolic heart failure and chest pain.   Assessment & Plan   # Chest pain, noncardiac -Continues to report chest discomfort.  Troponins are negative.  EKG is nonischemic.  Ongoing cocaine abuse.  Not a candidate for invasive cardiology procedures. -Overall suspect this is noncardiac.  Will give him a trial of PPI and pain medications to see if this improves.  We will also try a GI cocktail.  UDS is pending.  # Acute chronic diastolic heart failure, EF 65% -Symptoms of chest discomfort could all be related to volume overload.   His volume status is difficult to assess due to morbid obesity.  Will continue with Lasix 100 mg IV twice daily today.  We can see how he does.  Repeat echo pending.  Continue home antihypertensive agents.  # Paroxysmal A-fib -No recurrence.  Continue home diltiazem and Xarelto.      For questions or updates, please contact  [facility] Please consult [email or web address] for contact info under        Signed, Gerri Spore T. Flora Lipps, MD, [facility]   [facility]  05/26/2023 9:52 AM

## 2023-05-26 NOTE — Assessment & Plan Note (Signed)
Calculated BMI is 44,3

## 2023-05-26 NOTE — Assessment & Plan Note (Signed)
Glucose remained stable with insulin sliding scale during his hospitalization.  At discharge will resume metformin.

## 2023-05-26 NOTE — Plan of Care (Signed)
  Problem: Health Behavior/Discharge Planning: Goal: Ability to manage health-related needs will improve Outcome: Progressing   Problem: Education: Goal: Knowledge of General Education information will improve Description: Including pain rating scale, medication(s)/side effects and non-pharmacologic comfort measures Outcome: Progressing   

## 2023-05-26 NOTE — Progress Notes (Signed)
  Echocardiogram 2D Echocardiogram has been performed.  Benjamin [name] 05/26/2023, 9:59 AM

## 2023-05-26 NOTE — Progress Notes (Signed)
  Progress Note   Patient: Benjamin [name] XBJ:[id number] DOB: 1966-08-30 DOA: 05/25/2023     0 DOS: the patient was seen and examined on 05/26/2023   Brief hospital course: Mr. Miesse was admitted to the hospital with the working diagnosis of heart failure exacerbation   57 yo male with the past medical history of COPD, hypertension, dyslipidemia, paroxysmal atrial fibrillation, T2DM and obesity who presented with dyspnea. Endorsed several days of worsening dyspnea and groin edema. Poor urine output over lad 2 days. EMS was called, he was found volume overloaded and was transported to the ED. On his initial physical examination his blood pressure was 121/94. HR 79, RR 20 and 02 saturation 99% on 2 L per [place], lungs with decreased breath sounds and bilateral rales, heart with S1 and S2 present and regular with no gallops, abdomen with no distention, no lower extremity edema.   Chest radiograph with cardiomegaly with bilateral interstitial infiltrates, central and symmetric at the lower lobes, with no effusions.   EKG 81 bpm, normal axis, normal intervals, sinus rhythm with no significant ST segment or T wave changes.     Assessment and Plan: * Acute on chronic diastolic heart failure (HCC) Echocardiogram with preserved LV systolic function with EF 70 to 75%, severe LVH, RV systolic function preserved, RA with mild dilatation, no significant valvular disease.  Urine output is 1,620 ml Systolic blood pressure is 131 to 147 mmHg.   Plan to continue diuresis with furosemide 60 mg IV bid. Continue losartan and spironolactone.  After load reduction with hydralazine and will add isosorbide.  Add SGLT 2 inh to augment diuresis.   Add Kcl to prevent hypokalemia.   Hypertension Uncontrolled hypertension  Continue with losartan and hydralazine, will add isosorbide.    PAF (paroxysmal atrial fibrillation) (HCC) Continue rate control with diltiazem and anticoagulation with apixaban.   PE  (pulmonary thromboembolism) (HCC) Continue anticoagulation with rivaroxaban.   Type 2 diabetes mellitus with obesity (HCC) Continue glucose cover and monitoring with insulin sliding scale.  Patient is tolerating po well.   Iron deficiency anemia Cell count has been stable.   Class 3 obesity (HCC) Calculated BMI is 44,3         Subjective: Patient with improvement in his dyspnea but not back to baseline   Physical Exam: Vitals:   05/25/23 2345 05/26/23 0438 05/26/23 0728 05/26/23 1041  BP: (!) 149/85 131/72 (!) 143/92 (!) 147/104  Pulse: 77 81 82 85  Resp: 19 17 19 19   Temp: 98.2 F (36.8 C) 98.5 F (36.9 C) 98.5 F (36.9 C) 98.5 F (36.9 C)  TempSrc: Oral Oral Oral Oral  SpO2: 93% 96% 96% 98%  Weight:  (!) 144.3 kg    Height:       Neurology awake and alert ENT with no pallor Cardiovascular with S1 and S2 present and regular with no gallops, rubs or murmurs Respiratory with scattered rales with no wheezing or rhonchi Abdomen with no distention  Positive lower extremity edema  Data Reviewed:    Family Communication: no family at the bedside   Disposition: Status is: Inpatient Remains inpatient appropriate because: IV diuresis   Planned Discharge Destination: Home      Author: Coralie Keens, MD 05/26/2023 3:52 PM  For on call review [email or web address].

## 2023-05-26 NOTE — Hospital Course (Signed)
Mr. Eppinger was admitted to the hospital with the working diagnosis of heart failure exacerbation   57 yo male with the past medical history of COPD, hypertension, dyslipidemia, paroxysmal atrial fibrillation, T2DM and obesity who presented with dyspnea. Endorsed several days of worsening dyspnea and groin edema. Poor urine output over lad 2 days. EMS was called, he was found volume overloaded and was transported to the ED. On his initial physical examination his blood pressure was 121/94. HR 79, RR 20 and 02 saturation 99% on 2 L per [place], lungs with decreased breath sounds and bilateral rales, heart with S1 and S2 present and regular with no gallops, abdomen with no distention, no lower extremity edema.   Na 136, K 4.1 CL 104 bicarbonate 26, glucose 97, bun 11 cr 1,10 BNP 30.2 High sensitive troponin 17 and 15  Wbc 6,0 hgb 13.6 plt 370   Chest radiograph with cardiomegaly with bilateral interstitial infiltrates, central and symmetric at the lower lobes, with no effusions.   EKG 81 bpm, normal axis, normal intervals, sinus rhythm with no significant ST segment or T wave changes.   Patient was placed on furosemide IV for diuresis with improvement in his volume status.   09/07 feeling back to baseline, plan to continue with oral torsemide and have a close follow up as outpatient. Advised to follow fluid and salt restrictions.

## 2023-05-26 NOTE — Assessment & Plan Note (Signed)
Continue anticoagulation with rivaroxaban 

## 2023-05-26 NOTE — Assessment & Plan Note (Signed)
Uncontrolled hypertension  Continue with losartan and hydralazine, will add isosorbide.

## 2023-05-26 NOTE — Assessment & Plan Note (Addendum)
Echocardiogram with preserved LV systolic function with EF 70 to 75%, severe LVH, RV systolic function preserved, RA with mild dilatation, no significant valvular disease.  Urine output is 1,620 ml Systolic blood pressure is 131 to 147 mmHg.   Plan to continue diuresis with furosemide 60 mg IV bid. Continue losartan and spironolactone.  After load reduction with hydralazine and will add isosorbide.  Add SGLT 2 inh to augment diuresis.   Add Kcl to prevent hypokalemia.

## 2023-05-26 NOTE — Progress Notes (Addendum)
Heart Failure Navigator Progress Note  Assessed for Heart & Vascular TOC clinic readiness.  Patient is already established with advanced heart failure team, patient of Dr. Shirlee Latch.   Sharen Hones, PharmD, BCPS Heart Failure Stewardship Pharmacist Phone [phone]

## 2023-05-26 NOTE — Assessment & Plan Note (Signed)
Continue rate control with diltiazem and anticoagulation with apixaban.

## 2023-05-27 ENCOUNTER — Other Ambulatory Visit (HOSPITAL_COMMUNITY): Payer: Self-pay

## 2023-05-27 DIAGNOSIS — I480 Paroxysmal atrial fibrillation: Secondary | ICD-10-CM | POA: Diagnosis not present

## 2023-05-27 DIAGNOSIS — I10 Essential (primary) hypertension: Secondary | ICD-10-CM | POA: Diagnosis not present

## 2023-05-27 DIAGNOSIS — N179 Acute kidney failure, unspecified: Secondary | ICD-10-CM

## 2023-05-27 DIAGNOSIS — I5033 Acute on chronic diastolic (congestive) heart failure: Secondary | ICD-10-CM | POA: Diagnosis not present

## 2023-05-27 DIAGNOSIS — I2699 Other pulmonary embolism without acute cor pulmonale: Secondary | ICD-10-CM | POA: Diagnosis not present

## 2023-05-27 LAB — GLUCOSE, CAPILLARY
Glucose-Capillary: 136 mg/dL — ABNORMAL HIGH (ref 70–99)
Glucose-Capillary: 95 mg/dL (ref 70–99)

## 2023-05-27 LAB — BASIC METABOLIC PANEL
Anion gap: 10 (ref 5–15)
BUN: 20 mg/dL (ref 6–20)
CO2: 25 mmol/L (ref 22–32)
Calcium: 8.9 mg/dL (ref 8.9–10.3)
Chloride: 98 mmol/L (ref 98–111)
Creatinine, Ser: 1.47 mg/dL — ABNORMAL HIGH (ref 0.61–1.24)
GFR, Estimated: 55 mL/min — ABNORMAL LOW (ref >60)
Glucose, Bld: 86 mg/dL (ref 70–99)
Potassium: 4.4 mmol/L (ref 3.5–5.1)
Sodium: 133 mmol/L — ABNORMAL LOW (ref 135–145)

## 2023-05-27 LAB — MAGNESIUM: Magnesium: 2.1 mg/dL (ref 1.7–2.4)

## 2023-05-27 MED ORDER — POTASSIUM CHLORIDE ER 10 MEQ PO TBCR: 40.0000 meq | EXTENDED_RELEASE_TABLET | Freq: Every day | ORAL | Status: DC

## 2023-05-27 MED ORDER — ISOSORBIDE MONONITRATE ER 30 MG PO TB24
30.0000 mg | ORAL_TABLET | Freq: Every day | ORAL | 0 refills | Status: AC
  Filled 2023-05-27: qty 30, 30d supply, fill #0

## 2023-05-27 MED ORDER — TORSEMIDE 20 MG PO TABS: 60.0000 mg | ORAL_TABLET | Freq: Every day | ORAL | Status: DC

## 2023-05-27 MED ORDER — IPRATROPIUM-ALBUTEROL 0.5-2.5 (3) MG/3ML IN SOLN: 3.0000 mL | Freq: Four times a day (QID) | RESPIRATORY_TRACT | Status: DC | PRN

## 2023-05-27 NOTE — Discharge Summary (Signed)
Physician Discharge Summary   Patient: Benjamin [name] MRN: [id number] DOB: 12/10/1965  Admit date:     05/25/2023  Discharge date: 05/27/23  Discharge Physician: Benjamin [name]   PCP: Benjamin Rigg, NP   Recommendations at discharge:    Patient will continue taking torsemide 60 mg daily, instructed to increase to bid in case of volume overload 2 to 3 lbs in 24 hrs or 5 lbs in 7 days.  Added isosorbide, possible transition to Bidil as outpatient.  He has been advised to follow fluid and salt restrictions as outpatient. Follow up renal function and electrolytes in 7 days.  Follow up with Benjamin Denver NP in 7 to 10 days. Follow up with Cardiology as scheduled.   Discharge Diagnoses: Principal Problem:   Acute on chronic diastolic heart failure (HCC) Active Problems:   Hypertension   PAF (paroxysmal atrial fibrillation) (HCC)   PE (pulmonary thromboembolism) (HCC)   Type 2 diabetes mellitus with obesity (HCC)   Iron deficiency anemia   Class 3 obesity (HCC)  Resolved Problems:   * No resolved hospital problems. [facility] Course: Benjamin [name] was admitted to the hospital with the working diagnosis of heart failure exacerbation   57 yo male with the past medical history of COPD, hypertension, dyslipidemia, paroxysmal atrial fibrillation, T2DM and obesity who presented with dyspnea. Endorsed several days of worsening dyspnea and groin edema. Poor urine output over lad 2 days. EMS was called, he was found volume overloaded and was transported to the ED. On his initial physical examination his blood pressure was 121/94. HR 79, RR 20 and 02 saturation 99% on 2 L per [place], lungs with decreased breath sounds and bilateral rales, heart with S1 and S2 present and regular with no gallops, abdomen with no distention, no lower extremity edema.   Chest radiograph with cardiomegaly with bilateral interstitial infiltrates, central and symmetric at the lower lobes, with no effusions.    EKG 81 bpm, normal axis, normal intervals, sinus rhythm with no significant ST segment or T wave changes.     Assessment and Plan: * Acute on chronic diastolic heart failure (HCC) Echocardiogram with preserved LV systolic function with EF 70 to 75%, severe LVH, RV systolic function preserved, RA with mild dilatation, no significant valvular disease.  Patient was placed on IV furosemide for diuresis, negative fluid balance was achieved with improvement in his symptoms.   Systolic blood pressure is 123 to 135 mmHg.   Continue losartan, SGLT 2 inh and spironolactone.  After load reduction with hydralazine and isosorbide (possible transition to Bidil as outpatient).    Hypertension Uncontrolled hypertension  Added isosorbide with improvement in blood pressure. He will benefit from Bidil.  Continue with losartan and spironolactone.    PAF (paroxysmal atrial fibrillation) (HCC) Continue rate control with diltiazem and anticoagulation with apixaban.   PE (pulmonary thromboembolism) (HCC) Continue anticoagulation with rivaroxaban.   Type 2 diabetes mellitus with obesity (HCC) Glucose remained stable with insulin sliding scale during his hospitalization.  At discharge will resume metformin.   Iron deficiency anemia Cell count has been stable.   Class 3 obesity (HCC) Calculated BMI is 44,3   AKI (acute kidney injury) (HCC) Hyponatremia.   Renal function with serum cr at 1,47 with K at 4,4 and serum bicarbonate at 25. Na 133. Mg 2.1   Plan to resume oral diuretic therapy with torsemide tomorrow.  Continue SGLT inh and spironolactone  Will decrease K supplementation to avoid hyperkalemia.  Will need  follow up renal function and electrolytes as outpatient in 7 days.          Consultants: cardiology  Procedures performed: none   Disposition: Home Diet recommendation:  Cardiac and Carb modified diet DISCHARGE MEDICATION: Allergies as of 05/27/2023   No Known  Allergies      Medication List     STOP taking these medications    sertraline 25 MG tablet Commonly known as: Zoloft       TAKE these medications    albuterol (2.5 MG/3ML) 0.083% nebulizer solution Commonly known as: PROVENTIL INHALE 1 VIAL BY NEBULIZATION EVERY 6 (SIX) HOURS AS NEEDED FOR WHEEZING OR SHORTNESS OF BREATH. What changed: See the new instructions.   atorvastatin 40 MG tablet Commonly known as: LIPITOR TAKE 1 TABLET (40 MG TOTAL) BY MOUTH AT BEDTIME.   Blood Pressure Monitor Devi Please provide patient with insurance approved blood pressure device with L-XL cuff. BMI 55   Combivent Respimat 20-100 MCG/ACT Aers respimat Generic drug: Ipratropium-Albuterol INHALE 1 PUFF INTO THE LUNGS EVERY 6 (SIX) HOURS. What changed:  when to take this reasons to take this   diltiazem 360 MG 24 hr capsule Commonly known as: CARDIZEM CD TAKE 1 CAPSULE (360 MG TOTAL) BY MOUTH DAILY. (NOON)   FeroSul 325 (65 Fe) MG tablet Generic drug: ferrous sulfate TAKE 1 TABLET (325 MG TOTAL) BY MOUTH DAILY AT NOON.   hydrALAZINE 50 MG tablet Commonly known as: APRESOLINE TAKE 1 TABLET (50 MG TOTAL) BY MOUTH 3 (THREE) TIMES DAILY. (NOON,EVENING,BEDTIME)   isosorbide mononitrate 30 MG 24 hr tablet Commonly known as: IMDUR Take 1 tablet (30 mg total) by mouth daily. Start taking on: May 28, 2023   Jardiance 10 MG Tabs tablet Generic drug: empagliflozin TAKE 1 TABLET (10 MG TOTAL) BY MOUTH DAILY BEFORE BREAKFAST (AM)   losartan 50 MG tablet Commonly known as: COZAAR TAKE 1 TABLET (50 MG TOTAL) BY MOUTH DAILY. (NOON)   magnesium oxide 400 (240 Mg) MG tablet Commonly known as: MAG-OX TAKE 1 TABLET (400 MG TOTAL) BY MOUTH 2 (TWO) TIMES DAILY (NOON+BEDTIME)   metFORMIN 500 MG tablet Commonly known as: GLUCOPHAGE TAKE 1 TABLET BY MOUTH 2 (TWO) TIMES DAILY WITH A MEAL(NOON+BEDTIME)   omeprazole 20 MG capsule Commonly known as: PRILOSEC TAKE 1 CAPSULE 2 (TWO) TIMES  DAILY BEFORE A MEAL. TAKE FIRST CAPSULE 30 MIN PRIOR TO EATING OR TAKING OTHER MEDICATIONS. (AM+BEDTIME)   potassium chloride 10 MEQ tablet Commonly known as: KLOR-CON Take 4 tablets (40 mEq total) by mouth daily. What changed: See the new instructions.   spironolactone 50 MG tablet Commonly known as: ALDACTONE TAKE 1 TABLET (50 MG TOTAL) BY MOUTH DAILY. (NOON) What changed: See the new instructions.   tamsulosin 0.4 MG Caps capsule Commonly known as: FLOMAX Take 0.4 mg by mouth daily after supper.   torsemide 20 MG tablet Commonly known as: DEMADEX Take 3 tablets (60 mg total) by mouth daily. Start taking on: May 28, 2023 What changed: when to take this   True Metrix Blood Glucose Test test strip Generic drug: glucose blood Use as instructed. Check blood glucose level by fingerstick twice per day.   True Metrix Meter w/Device Kit Use as instructed. Check blood glucose level by fingerstick twice per day.   TRUEplus Lancets 28G Misc Use as instructed. Check blood glucose level by fingerstick twice per day.   Xarelto 20 MG Tabs tablet Generic drug: rivaroxaban TAKE 1 TABLET (20 MG TOTAL) BY MOUTH DAILY WITH SUPPER (BEDTIME)  Discharge Exam: Filed Weights   05/25/23 1815 05/26/23 0438 05/27/23 0506  Weight: (!) 143.4 kg (!) 144.3 kg (!) 142.6 kg   BP 135/84 (BP Location: Right Arm)   Pulse 88   Temp 97.8 F (36.6 C) (Oral)   Resp 20   Ht 5\' 11"  (1.803 m)   Wt (!) 142.6 kg   SpO2 100%   BMI 43.85 kg/m   Patient is feeling well, no chest pain, no dyspnea, no edema, no PND or orthopnea.   Neurology awake and alert ENT with no pallor or icterus Cardiovascular with S1 and S2 present and regular with no gallops, rubs or murmurs Respiratory with no rales or wheezing, no rhonchi Abdomen with no distention  No lower extremity edema.   Condition at discharge: stable  The results of significant diagnostics from this hospitalization (including imaging,  microbiology, ancillary and laboratory) are listed below for reference.   Imaging Studies: ECHOCARDIOGRAM COMPLETE  Result Date: 05/26/2023    ECHOCARDIOGRAM REPORT   Patient Name:   Benjamin [name] Date of Exam: 05/26/2023 Medical Rec #:  [id number]          Height:       71.0 in Accession #:    [id number]         Weight:       318.1 lb Date of Birth:  April 26, 1966          BSA:          2.568 m Patient Age:    57 years           BP:           143/92 mmHg Patient Gender: M                  HR:           82 bpm. Exam Location:  Inpatient Procedure: 2D Echo, Color Doppler, Cardiac Doppler and Intracardiac            Opacification Agent Indications:    CHF  History:        Patient has prior history of Echocardiogram examinations, most                 recent 01/28/2022. CHF, Arrythmias:Atrial Fibrillation,                 Signs/Symptoms:Edema; Risk Factors:Sleep Apnea, Obesity,                 Hypertension and Diabetes. PE, chronic respiratory failure.  Sonographer:    Milda Smart Referring Phys: [phone] LINDSAY B ROBERTS  Sonographer Comments: Patient is obese. Image acquisition challenging due to patient body habitus and Image acquisition challenging due to respiratory motion. IMPRESSIONS  1. Left ventricular ejection fraction, by estimation, is 70 to 75%. Left ventricular ejection fraction by 2D MOD biplane is 71.1 %. The left ventricle has hyperdynamic function. The left ventricle has no regional wall motion abnormalities. There is severe concentric left ventricular hypertrophy. Left ventricular diastolic parameters are consistent with Grade I diastolic dysfunction (impaired relaxation).  2. Right ventricular systolic function is normal. The right ventricular size is mildly enlarged. Tricuspid regurgitation signal is inadequate for assessing PA pressure.  3. Right atrial size was mildly dilated.  4. The mitral valve was not well visualized. No evidence of mitral valve regurgitation.  5. The aortic valve was not  well visualized. Aortic valve regurgitation is not visualized. No aortic stenosis is present.  6. The inferior vena cava  is normal in size with greater than 50% respiratory variability, suggesting right atrial pressure of 3 mmHg. Comparison(s): Changes from prior study are noted. 01/28/2022: LVEF 60-65%, moderate LVH, grade 1 DD. FINDINGS  Left Ventricle: Left ventricular ejection fraction, by estimation, is 70 to 75%. Left ventricular ejection fraction by 2D MOD biplane is 71.1 %. The left ventricle has hyperdynamic function. The left ventricle has no regional wall motion abnormalities. Definity contrast agent was given IV to delineate the left ventricular endocardial borders. The left ventricular internal cavity size was normal in size. There is severe concentric left ventricular hypertrophy. Left ventricular diastolic parameters are consistent with Grade I diastolic dysfunction (impaired relaxation). Indeterminate filling pressures. Right Ventricle: The right ventricular size is mildly enlarged. No increase in right ventricular wall thickness. Right ventricular systolic function is normal. Tricuspid regurgitation signal is inadequate for assessing PA pressure. Left Atrium: Left atrial size was normal in size. Right Atrium: Right atrial size was mildly dilated. Pericardium: There is no evidence of pericardial effusion. Mitral Valve: The mitral valve was not well visualized. No evidence of mitral valve regurgitation. MV peak gradient, 3.6 mmHg. The mean mitral valve gradient is 2.0 mmHg. Tricuspid Valve: The tricuspid valve is not well visualized. Tricuspid valve regurgitation is not demonstrated. Aortic Valve: The aortic valve was not well visualized. Aortic valve regurgitation is not visualized. No aortic stenosis is present. Pulmonic Valve: The pulmonic valve was not well visualized. Pulmonic valve regurgitation is not visualized. Aorta: The aortic root is normal in size and structure. Venous: The inferior vena  cava is normal in size with greater than 50% respiratory variability, suggesting right atrial pressure of 3 mmHg. IAS/Shunts: No atrial level shunt detected by color flow Doppler.  LEFT VENTRICLE PLAX 2D                        Biplane EF (MOD) LVIDd:         4.40 cm         LV Biplane EF:   Left LVIDs:         2.60 cm                          ventricular LV PW:         1.70 cm                          ejection LV IVS:        1.70 cm                          fraction by LVOT diam:     2.30 cm                          2D MOD LV SV:         107                              biplane is LV SV Index:   42                               71.1 %. LVOT Area:     4.15 cm  Diastology                                LV e' medial:    4.46 cm/s LV Volumes (MOD)               LV E/e' medial:  13.7 LV vol d, MOD    85.4 ml       LV e' lateral:   6.96 cm/s A2C:                           LV E/e' lateral: 8.8 LV vol d, MOD    107.0 ml A4C: LV vol s, MOD    29.5 ml A2C: LV vol s, MOD    27.1 ml A4C: LV SV MOD A2C:   55.9 ml LV SV MOD A4C:   107.0 ml LV SV MOD BP:    70.2 ml RIGHT VENTRICLE             IVC RV Basal diam:  4.30 cm     IVC diam: 1.90 cm RV Mid diam:    3.10 cm RV S prime:     19.40 cm/s TAPSE (M-mode): 2.0 cm LEFT ATRIUM             Index        RIGHT ATRIUM           Index LA diam:        3.40 cm 1.32 cm/m   RA Area:     17.50 cm LA Vol (A2C):   53.5 ml 20.83 ml/m  RA Volume:   45.00 ml  17.52 ml/m LA Vol (A4C):   51.7 ml 20.13 ml/m LA Biplane Vol: 52.7 ml 20.52 ml/m  AORTIC VALVE LVOT Vmax:   132.50 cm/s LVOT Vmean:  97.150 cm/s LVOT VTI:    0.257 m  AORTA Ao Root diam: 3.60 cm Ao Asc diam:  3.70 cm MITRAL VALVE MV Area (PHT): 2.24 cm    SHUNTS MV Area VTI:   3.77 cm    Systemic VTI:  0.26 m MV Peak grad:  3.6 mmHg    Systemic Diam: 2.30 cm MV Mean grad:  2.0 mmHg MV Vmax:       0.95 m/s MV Vmean:      63.5 cm/s MV Decel Time: 338 msec MV E velocity: 61.30 cm/s MV A velocity: 77.60  cm/s MV E/A ratio:  0.79 Zoila Shutter MD Electronically signed by Zoila Shutter MD Signature Date/Time: 05/26/2023/10:11:43 AM    Final    DG Chest Portable 1 View  Result Date: 05/25/2023 CLINICAL DATA:  Shortness of breath. EXAM: PORTABLE CHEST 1 VIEW COMPARISON:  Radiographs 02/16/2022 and 07/20/2021.  CT 02/26/2019. FINDINGS: 1255 hours. The heart size and mediastinal contours are stable with mild cardiomegaly and aortic atherosclerosis. Diffuse interstitial prominence has increased from the prior studies, suspicious for mild pulmonary edema. No confluent airspace opacity, pneumothorax or significant pleural effusion identified. No acute osseous findings are seen. Telemetry leads overlie the chest. IMPRESSION: Interval increase in interstitial prominence suspicious for mild pulmonary edema. No confluent airspace opacity. Electronically Signed   By: Carey Bullocks M.D.   On: 05/25/2023 14:46    Microbiology: Results for orders placed or performed during the hospital encounter of 05/25/23  SARS Coronavirus 2 by RT PCR (hospital order, performed in [facility] hospital lab) *cepheid single result test* Anterior Nasal Swab  Status: None   Collection Time: 05/25/23  1:30 PM   Specimen: Anterior Nasal Swab  Result Value Ref Range Status   SARS Coronavirus 2 by RT PCR NEGATIVE NEGATIVE Final    Comment: Performed at [facility], [address] [place], [place] [address]    Labs: CBC: Recent Labs  Lab 05/25/23 1330 05/26/23 0341  WBC 4.6 6.0  NEUTROABS 2.5  --   HGB 13.2 13.6  HCT 43.6 44.5  MCV 79.7* 79.3*  PLT 380 370   Basic Metabolic Panel: Recent Labs  Lab 05/25/23 1330 05/26/23 0341 05/27/23 0226  NA 136 137 133*  K 4.1 3.7 4.4  CL 104 100 98  CO2 26 26 25   GLUCOSE 97 103* 86  BUN 11 16 20   CREATININE 1.10 1.13 1.47*  CALCIUM 8.6* 8.9 8.9  MG  --  2.0 2.1  PHOS  --  3.4  --    Liver Function Tests: Recent Labs  Lab 05/25/23 1330  AST 17  ALT 15  ALKPHOS  43  BILITOT 0.4  PROT 7.0  ALBUMIN 3.3*   CBG: Recent Labs  Lab 05/26/23 1038 05/26/23 1555 05/26/23 2047 05/27/23 0606 05/27/23 1109  GLUCAP 98 108* 103* 136* 95    Discharge time spent: greater than 30 minutes.  Signed: Coralie Keens, MD [facility] 05/27/2023

## 2023-05-27 NOTE — Evaluation (Signed)
Occupational Therapy Evaluation Patient Details Name: Benjamin [name] MRN: [id number] DOB: 1966/08/11 Today's Date: 05/27/2023   History of Present Illness Pt is a 57 y/o M presenting to ED On 9/5 with chest pressure, admitted for acute on chronic diastolic heart failure. PMH includes COPD, HTN, HLD, dCHF, paroxysmal A fib, PE on DOAC, DM2, OSA on CPAP, morbid obesity, polysubstance abuse, mood disorder   Clinical Impression   Pt reports ind at baseline with ADLs, uses cane for mobility. Does not drive and lives alone with PRN support from friends/family. Pt needing set up -min A for ADLs, mod I for bed mobility and supervision for transfers without AD. Pt with mild unsteadiness and 2/4 DOE with short distance ambulation in room. SpO2 mid-high 90's on RA throughout, HR 90-109bpm. Pt presenting with impairments listed below, will follow acutely. Anticipate no OT follow up needs at d/c.        If plan is discharge home, recommend the following: A little help with walking and/or transfers;A little help with bathing/dressing/bathroom;Assistance with cooking/housework;Direct supervision/assist for medications management;Direct supervision/assist for financial management;Help with stairs or ramp for entrance;Assist for transportation    Functional Status Assessment  Patient has had a recent decline in their functional status and demonstrates the ability to make significant improvements in function in a reasonable and predictable amount of time.  Equipment Recommendations  Tub/shower seat    Recommendations for Other Services PT consult     Precautions / Restrictions Precautions Precautions: Fall Restrictions Weight Bearing Restrictions: No      Mobility Bed Mobility Overal bed mobility: Modified Independent             General bed mobility comments: use of bed rail    Transfers Overall transfer level: Needs assistance Equipment used: None Transfers: Sit to/from Stand Sit to  Stand: Supervision                  Balance Overall balance assessment: Mild deficits observed, not formally tested                                         ADL either performed or assessed with clinical judgement   ADL Overall ADL's : Needs assistance/impaired Eating/Feeding: Set up;Sitting   Grooming: Set up;Standing;Oral care Grooming Details (indicate cue type and reason): brushes teeth standing at sink Upper Body Bathing: Minimal assistance   Lower Body Bathing: Minimal assistance   Upper Body Dressing : Supervision/safety   Lower Body Dressing: Supervision/safety Lower Body Dressing Details (indicate cue type and reason): dons socks Toilet Transfer: Supervision/safety   Toileting- Clothing Manipulation and Hygiene: Supervision/safety   Tub/ Engineer, structural: Tub transfer;Supervision/safety   Functional mobility during ADLs: Supervision/safety       Vision   Vision Assessment?: No apparent visual deficits Additional Comments: WFL for BADL     Perception Perception: Not tested       Praxis Praxis: Not tested       Pertinent Vitals/Pain Pain Assessment Pain Assessment: No/denies pain     Extremity/Trunk Assessment Upper Extremity Assessment Upper Extremity Assessment: Overall WFL for tasks assessed   Lower Extremity Assessment Lower Extremity Assessment: Defer to PT evaluation   Cervical / Trunk Assessment Cervical / Trunk Assessment: Normal   Communication Communication Communication: No apparent difficulties   Cognition Arousal: Alert Behavior During Therapy: WFL for tasks assessed/performed Overall Cognitive Status: Within Functional Limits for tasks assessed  Area of Impairment: Safety/judgement, Awareness                               General Comments: mild impulsivity, aware of DOE with activity, states he wears 2L O2 baseline     General Comments  HR 94-109 during session, SpO2 high 90's on RA     Exercises     Shoulder Instructions      Home Living Family/patient expects to be discharged to:: Private residence Living Arrangements: Alone Available Help at Discharge: Family;Friend(s);Available PRN/intermittently (pt does not specify) Type of Home: Apartment Home Access: Stairs to enter Entrance Stairs-Number of Steps: 6 steps   Home Layout: One level     Bathroom Shower/Tub: Tub/shower unit         Home Equipment: Cane - single Librarian, academic (2 wheels)   Additional Comments: states he wears 2L O2 baseline/ bipap at night      Prior Functioning/Environment Prior Level of Function : Independent/Modified Independent             Mobility Comments: cane ADLs Comments: ind, uses medicaid transport        OT Problem List: Decreased strength;Decreased range of motion;Decreased activity tolerance;Decreased coordination;Decreased cognition;Cardiopulmonary status limiting activity;Decreased safety awareness;Impaired balance (sitting and/or standing)      OT Treatment/Interventions: Self-care/ADL training;Therapeutic exercise;Energy conservation;DME and/or AE instruction;Therapeutic activities;Patient/family education;Balance training;Cognitive remediation/compensation    OT Goals(Current goals can be found in the care plan section) Acute Rehab OT Goals Patient Stated Goal: none stated OT Goal Formulation: With patient Time For Goal Achievement: 06/10/23 Potential to Achieve Goals: Good ADL Goals Pt Will Perform Upper Body Dressing: with modified independence;sitting Pt Will Perform Lower Body Dressing: with modified independence;sit to/from stand;sitting/lateral leans Pt Will Transfer to Toilet: with modified independence;ambulating;regular height toilet Pt Will Perform Tub/Shower Transfer: Tub transfer;Shower transfer;with modified independence;ambulating Additional ADL Goal #1: pt will verbalize x3 energy conservation strategies in prep for ADLs  OT  Frequency: Min 1X/week    Co-evaluation              AM-PAC OT "6 Clicks" Daily Activity     Outcome Measure Help from another person eating meals?: None Help from another person taking care of personal grooming?: A Little Help from another person toileting, which includes using toliet, bedpan, or urinal?: A Little Help from another person bathing (including washing, rinsing, drying)?: A Little Help from another person to put on and taking off regular upper body clothing?: A Little Help from another person to put on and taking off regular lower body clothing?: A Little 6 Click Score: 19   End of Session Nurse Communication: Mobility status  Activity Tolerance: Patient tolerated treatment well Patient left: with call bell/phone within reach;in chair  OT Visit Diagnosis: Unsteadiness on feet (R26.81);Other abnormalities of gait and mobility (R26.89);Muscle weakness (generalized) (M62.81)                Time: 4132-4401 OT Time Calculation (min): 18 min Charges:  OT General Charges $OT Visit: 1 Visit OT Evaluation $OT Eval Low Complexity: 1 Low  Adria Costley K, OTD, OTR/L SecureChat Preferred Acute Rehab [phone]   Armand Preast K Koonce 05/27/2023, 9:07 AM

## 2023-05-27 NOTE — Progress Notes (Signed)
Discharge instructions (including medications) discussed with and copy provided to patient. Patient verbalized understanding. Patient dressed himself. Called ride for discharge home.

## 2023-05-27 NOTE — Progress Notes (Signed)
Progress Note  Patient Name: Benjamin [name] Date of Encounter: 05/27/2023  Primary Cardiologist:   Dietrich Pates, MD   Subjective   Breathing at baseline and he want to go home.    Inpatient Medications    Scheduled Meds:  atorvastatin  40 mg Oral QHS   diltiazem  360 mg Oral Daily   empagliflozin  10 mg Oral Daily   ferrous sulfate  325 mg Oral Q breakfast   furosemide  60 mg Intravenous BID   hydrALAZINE  50 mg Oral Q8H   insulin aspart  0-20 Units Subcutaneous TID WC   isosorbide mononitrate  30 mg Oral Daily   losartan  50 mg Oral Daily   pantoprazole  40 mg Oral Daily   rivaroxaban  20 mg Oral Q supper   sertraline  25 mg Oral Daily   sodium chloride flush  3 mL Intravenous Q12H   spironolactone  50 mg Oral Daily   tamsulosin  0.4 mg Oral QPC supper   Continuous Infusions:  sodium chloride     PRN Meds: sodium chloride, acetaminophen **OR** acetaminophen, albuterol, bisacodyl, hydrALAZINE, ipratropium-albuterol, ondansetron **OR** ondansetron (ZOFRAN) IV, oxyCODONE, polyethylene glycol, sodium chloride flush   Vital Signs    Vitals:   05/26/23 1956 05/26/23 2322 05/27/23 0506 05/27/23 0746  BP: 113/77 (!) 96/49 (!) 142/87 123/79  Pulse: (!) 103 75 83 92  Resp: 19 17 18 20   Temp: 98.4 F (36.9 C) 98.2 F (36.8 C) 97.7 F (36.5 C) 98.4 F (36.9 C)  TempSrc: Oral Oral Axillary Oral  SpO2: 96% 94% 99% 91%  Weight:   (!) 142.6 kg   Height:        Intake/Output Summary (Last 24 hours) at 05/27/2023 0816 Last data filed at 05/26/2023 2000 Gross per 24 hour  Intake 717 ml  Output 350 ml  Net 367 ml   Filed Weights   05/25/23 1815 05/26/23 0438 05/27/23 0506  Weight: (!) 143.4 kg (!) 144.3 kg (!) 142.6 kg    Telemetry    NSR - Personally Reviewed  ECG    NA  - Personally Reviewed  Physical Exam   GEN: No acute distress.   Neck: No  JVD Cardiac: RRR,  murmurs, rubs, or gallops.  Respiratory: Clear  to auscultation bilaterally. GI: Soft,  nontender, non-distended  MS: No  edema; No deformity. Neuro:  Nonfocal  Psych: Normal affect   Labs    Chemistry Recent Labs  Lab 05/25/23 1330 05/26/23 0341 05/27/23 0226  NA 136 137 133*  K 4.1 3.7 4.4  CL 104 100 98  CO2 26 26 25   GLUCOSE 97 103* 86  BUN 11 16 20   CREATININE 1.10 1.13 1.47*  CALCIUM 8.6* 8.9 8.9  PROT 7.0  --   --   ALBUMIN 3.3*  --   --   AST 17  --   --   ALT 15  --   --   ALKPHOS 43  --   --   BILITOT 0.4  --   --   GFRNONAA >60 >60 55*  ANIONGAP 6 11 10      Hematology Recent Labs  Lab 05/25/23 1330 05/26/23 0341  WBC 4.6 6.0  RBC 5.47 5.61  HGB 13.2 13.6  HCT 43.6 44.5  MCV 79.7* 79.3*  MCH 24.1* 24.2*  MCHC 30.3 30.6  RDW 14.8 14.9  PLT 380 370    Cardiac EnzymesNo results for input(s): "TROPONINI" in the last 168 hours. No  results for input(s): "TROPIPOC" in the last 168 hours.   BNP Recent Labs  Lab 05/25/23 1330  BNP 30.2     DDimer No results for input(s): "DDIMER" in the last 168 hours.   Radiology    ECHOCARDIOGRAM COMPLETE  Result Date: 05/26/2023    ECHOCARDIOGRAM REPORT   Patient Name:   Benjamin [name] Date of Exam: 05/26/2023 Medical Rec #:  [id number]          Height:       71.0 in Accession #:    [id number]         Weight:       318.1 lb Date of Birth:  [birth year]          BSA:          2.568 m Patient Age:    57 years           BP:           143/92 mmHg Patient Gender: M                  HR:           82 bpm. Exam Location:  Inpatient Procedure: 2D Echo, Color Doppler, Cardiac Doppler and Intracardiac            Opacification Agent Indications:    CHF  History:        Patient has prior history of Echocardiogram examinations, most                 recent 01/28/2022. CHF, Arrythmias:Atrial Fibrillation,                 Signs/Symptoms:Edema; Risk Factors:Sleep Apnea, Obesity,                 Hypertension and Diabetes. PE, chronic respiratory failure.  Sonographer:    Milda Smart Referring Phys: [id number] LINDSAY B ROBERTS   Sonographer Comments: Patient is obese. Image acquisition challenging due to patient body habitus and Image acquisition challenging due to respiratory motion. IMPRESSIONS  1. Left ventricular ejection fraction, by estimation, is 70 to 75%. Left ventricular ejection fraction by 2D MOD biplane is 71.1 %. The left ventricle has hyperdynamic function. The left ventricle has no regional wall motion abnormalities. There is severe concentric left ventricular hypertrophy. Left ventricular diastolic parameters are consistent with Grade I diastolic dysfunction (impaired relaxation).  2. Right ventricular systolic function is normal. The right ventricular size is mildly enlarged. Tricuspid regurgitation signal is inadequate for assessing PA pressure.  3. Right atrial size was mildly dilated.  4. The mitral valve was not well visualized. No evidence of mitral valve regurgitation.  5. The aortic valve was not well visualized. Aortic valve regurgitation is not visualized. No aortic stenosis is present.  6. The inferior vena cava is normal in size with greater than 50% respiratory variability, suggesting right atrial pressure of 3 mmHg. Comparison(s): Changes from prior study are noted. 01/28/2022: LVEF 60-65%, moderate LVH, grade 1 DD. FINDINGS  Left Ventricle: Left ventricular ejection fraction, by estimation, is 70 to 75%. Left ventricular ejection fraction by 2D MOD biplane is 71.1 %. The left ventricle has hyperdynamic function. The left ventricle has no regional wall motion abnormalities. Definity contrast agent was given IV to delineate the left ventricular endocardial borders. The left ventricular internal cavity size was normal in size. There is severe concentric left ventricular hypertrophy. Left ventricular diastolic parameters are consistent with Grade I diastolic dysfunction (impaired relaxation). Indeterminate filling pressures.  Right Ventricle: The right ventricular size is mildly enlarged. No increase in right  ventricular wall thickness. Right ventricular systolic function is normal. Tricuspid regurgitation signal is inadequate for assessing PA pressure. Left Atrium: Left atrial size was normal in size. Right Atrium: Right atrial size was mildly dilated. Pericardium: There is no evidence of pericardial effusion. Mitral Valve: The mitral valve was not well visualized. No evidence of mitral valve regurgitation. MV peak gradient, 3.6 mmHg. The mean mitral valve gradient is 2.0 mmHg. Tricuspid Valve: The tricuspid valve is not well visualized. Tricuspid valve regurgitation is not demonstrated. Aortic Valve: The aortic valve was not well visualized. Aortic valve regurgitation is not visualized. No aortic stenosis is present. Pulmonic Valve: The pulmonic valve was not well visualized. Pulmonic valve regurgitation is not visualized. Aorta: The aortic root is normal in size and structure. Venous: The inferior vena cava is normal in size with greater than 50% respiratory variability, suggesting right atrial pressure of 3 mmHg. IAS/Shunts: No atrial level shunt detected by color flow Doppler.  LEFT VENTRICLE PLAX 2D                        Biplane EF (MOD) LVIDd:         4.40 cm         LV Biplane EF:   Left LVIDs:         2.60 cm                          ventricular LV PW:         1.70 cm                          ejection LV IVS:        1.70 cm                          fraction by LVOT diam:     2.30 cm                          2D MOD LV SV:         107                              biplane is LV SV Index:   42                               71.1 %. LVOT Area:     4.15 cm                                Diastology                                LV e' medial:    4.46 cm/s LV Volumes (MOD)               LV E/e' medial:  13.7 LV vol d, MOD    85.4 ml       LV e' lateral:   6.96 cm/s A2C:  LV E/e' lateral: 8.8 LV vol d, MOD    107.0 ml A4C: LV vol s, MOD    29.5 ml A2C: LV vol s, MOD    27.1 ml A4C: LV SV MOD  A2C:   55.9 ml LV SV MOD A4C:   107.0 ml LV SV MOD BP:    70.2 ml RIGHT VENTRICLE             IVC RV Basal diam:  4.30 cm     IVC diam: 1.90 cm RV Mid diam:    3.10 cm RV S prime:     19.40 cm/s TAPSE (M-mode): 2.0 cm LEFT ATRIUM             Index        RIGHT ATRIUM           Index LA diam:        3.40 cm 1.32 cm/m   RA Area:     17.50 cm LA Vol (A2C):   53.5 ml 20.83 ml/m  RA Volume:   45.00 ml  17.52 ml/m LA Vol (A4C):   51.7 ml 20.13 ml/m LA Biplane Vol: 52.7 ml 20.52 ml/m  AORTIC VALVE LVOT Vmax:   132.50 cm/s LVOT Vmean:  97.150 cm/s LVOT VTI:    0.257 m  AORTA Ao Root diam: 3.60 cm Ao Asc diam:  3.70 cm MITRAL VALVE MV Area (PHT): 2.24 cm    SHUNTS MV Area VTI:   3.77 cm    Systemic VTI:  0.26 m MV Peak grad:  3.6 mmHg    Systemic Diam: 2.30 cm MV Mean grad:  2.0 mmHg MV Vmax:       0.95 m/s MV Vmean:      63.5 cm/s MV Decel Time: 338 msec MV E velocity: 61.30 cm/s MV A velocity: 77.60 cm/s MV E/A ratio:  0.79 Zoila Shutter MD Electronically signed by Zoila Shutter MD Signature Date/Time: 05/26/2023/10:11:43 AM    Final    DG Chest Portable 1 View  Result Date: 05/25/2023 CLINICAL DATA:  Shortness of breath. EXAM: PORTABLE CHEST 1 VIEW COMPARISON:  Radiographs 02/16/2022 and 07/20/2021.  CT 02/26/2019. FINDINGS: 1255 hours. The heart size and mediastinal contours are stable with mild cardiomegaly and aortic atherosclerosis. Diffuse interstitial prominence has increased from the prior studies, suspicious for mild pulmonary edema. No confluent airspace opacity, pneumothorax or significant pleural effusion identified. No acute osseous findings are seen. Telemetry leads overlie the chest. IMPRESSION: Interval increase in interstitial prominence suspicious for mild pulmonary edema. No confluent airspace opacity. Electronically Signed   By: Carey Bullocks M.D.   On: 05/25/2023 14:46    Cardiac Studies   Echo 05/26/23  1. Left ventricular ejection fraction, by estimation, is 70 to 75%. Left   ventricular ejection fraction by 2D MOD biplane is 71.1 %. The left  ventricle has hyperdynamic function. The left ventricle has no regional  wall motion abnormalities. There is  severe concentric left ventricular hypertrophy. Left ventricular diastolic  parameters are consistent with Grade I diastolic dysfunction (impaired  relaxation).   2. Right ventricular systolic function is normal. The right ventricular  size is mildly enlarged. Tricuspid regurgitation signal is inadequate for  assessing PA pressure.   3. Right atrial size was mildly dilated.   4. The mitral valve was not well visualized. No evidence of mitral valve  regurgitation.   5. The aortic valve was not well visualized. Aortic valve regurgitation  is not visualized. No aortic stenosis is present.  6. The inferior vena cava is normal in size with greater than 50%  respiratory variability, suggesting right atrial pressure of 3 mmHg.    Patient Profile     57 y.o. male with history of diastolic heart failure, morbid obesity, paroxysmal atrial fibrillation, OSA, PE, hypertension, diabetes, ongoing cocaine abuse who was admitted to the hospital on 05/25/2023 for acute on chronic diastolic heart failure and chest pain.   Assessment & Plan    Chest pain:  Likely non cardiac.  No invasive work up is planned.   Acute on chronic diastolic HF:   Echo as above.  Unfortunately intake and output not complete.  He seems to be euvolemic.  Creat has bumped slightly.  I think that he can go home.   Meds as on MAR and I am going to guess that he needs 60 mg PO daily Lasix at home.   We will arrange follow up.  I wonder if the pharmacy could get him BiDil for ease of administration.   PAF:  Maintaining NSR.  Continue Diltiazem and Xarelto.      For questions or updates, please contact [facility] Please consult [email or web address] for contact info under Cardiology/STEMI.   Signed, Rollene Rotunda, MD  05/27/2023, 8:16 AM

## 2023-05-27 NOTE — Assessment & Plan Note (Signed)
Hyponatremia.   Renal function with serum cr at 1,47 with K at 4,4 and serum bicarbonate at 25. Na 133. Mg 2.1   Plan to resume oral diuretic therapy with torsemide tomorrow.  Continue SGLT inh and spironolactone  Will decrease K supplementation to avoid hyperkalemia.  Will need follow up renal function and electrolytes as outpatient in 7 days.

## 2023-05-29 ENCOUNTER — Telehealth: Payer: Self-pay

## 2023-05-29 NOTE — Transitions of Care (Post Inpatient/ED Visit) (Signed)
   05/29/2023  Name: Benjamin [name] MRN: [id number] DOB: 1966-08-19  Today's TOC FU Call Status: Today's TOC FU Call Status:: Unsuccessful Call (1st Attempt) Unsuccessful Call (1st Attempt) Date: 05/29/23  Attempted to reach the patient regarding the most recent Inpatient/ED visit.  Follow Up Plan: Additional outreach attempts will be made to reach the patient to complete the Transitions of Care (Post Inpatient/ED visit) call.   Alyse Low, RN, BA, [facility], CRRN [facility] Coordinator, Transition of Care Ph # [phone]

## 2023-05-29 NOTE — Telephone Encounter (Signed)
He is in need of a new glucometer

## 2023-05-29 NOTE — Transitions of Care (Post Inpatient/ED Visit) (Signed)
   05/29/2023  Name: Benjamin [name] MRN: [id number] DOB: 04/02/1966  Today's TOC FU Call Status: Today's TOC FU Call Status:: Successful TOC FU Call Completed TOC FU Call Complete Date: 05/29/23 Patient's Name and Date of Birth confirmed.  Transition Care Management Follow-up Telephone Call Date of Discharge: 05/27/23 Discharge Facility: [facility]) Type of Discharge: Inpatient Admission Primary Inpatient Discharge Diagnosis:: acute on chronic diastolic heart failure How have you been since you were released from the hospital?: Same Any questions or concerns?: No  Items Reviewed: Did you receive and understand the discharge instructions provided?: Yes Medications obtained,verified, and reconciled?: Partial Review Completed Reason for Partial Mediation Review: He stated that he has all medications and did not have any questions about the med regime and did not need to review the med list.  He also has a scale and nebulizer.  He does not have a glucometer. Any new allergies since your discharge?: No Dietary orders reviewed?: Yes Type of Diet Ordered:: heart healthy  low sodium.  He stated he also is trying to keep his fluid intake to less than 60 oz / day as he was instructed. Do you have support at home?: Yes People in Home: alone Name of Support/Comfort Primary Source: said he has help if needed.  Medications Reviewed Today: Medications Reviewed Today   Medications were not reviewed in this encounter     Home Care and Equipment/Supplies: Were Home Health Services Ordered?: No Any new equipment or medical supplies ordered?: No  Functional Questionnaire: Do you need assistance with bathing/showering or dressing?: No Do you need assistance with meal preparation?: No Do you need assistance with eating?: No Do you have difficulty maintaining continence: No Do you need assistance with getting out of bed/getting out of a chair/moving?: Yes (He ambulates with a cane and uses  O2 @ 2-3 L . He said he uses the O2 " most of the time.") Do you have difficulty managing or taking your medications?: Yes (meds are bubble packed.)  Follow up appointments reviewed: PCP Follow-up appointment confirmed?: Yes Date of PCP follow-up appointment?: 06/12/23 Follow-up Provider: [name], NP [facility] Follow-up appointment confirmed?: NA Do you need transportation to your follow-up appointment?: No Do you understand care options if your condition(s) worsen?: Yes-patient verbalized understanding    SIGNATURE Robyne Peers, RN

## 2023-05-30 ENCOUNTER — Other Ambulatory Visit: Payer: Self-pay

## 2023-05-30 DIAGNOSIS — E1169 Type 2 diabetes mellitus with other specified complication: Secondary | ICD-10-CM

## 2023-05-30 DIAGNOSIS — E1165 Type 2 diabetes mellitus with hyperglycemia: Secondary | ICD-10-CM

## 2023-05-30 MED ORDER — TRUE METRIX METER W/DEVICE KIT: PACK | 0 refills | Status: DC

## 2023-05-30 MED ORDER — TRUE METRIX BLOOD GLUCOSE TEST VI STRP
ORAL_STRIP | 12 refills | Status: AC
Start: 2023-05-30 — End: ?

## 2023-05-30 NOTE — Telephone Encounter (Signed)
Call placed to patient and informed him that an order for a new glucometer was sent to [facility].  He said he understood and was very Adult nurse

## 2023-05-30 NOTE — Telephone Encounter (Signed)
Glucometer and test supplies sent [facility].

## 2023-06-01 ENCOUNTER — Other Ambulatory Visit: Payer: Self-pay | Admitting: Nurse Practitioner

## 2023-06-01 ENCOUNTER — Encounter (HOSPITAL_COMMUNITY): Payer: MEDICAID | Admitting: Student

## 2023-06-01 ENCOUNTER — Other Ambulatory Visit (HOSPITAL_COMMUNITY): Payer: Self-pay | Admitting: Family Medicine

## 2023-06-01 DIAGNOSIS — R1084 Generalized abdominal pain: Secondary | ICD-10-CM

## 2023-06-09 ENCOUNTER — Ambulatory Visit (HOSPITAL_BASED_OUTPATIENT_CLINIC_OR_DEPARTMENT_OTHER): Payer: MEDICAID | Admitting: Pulmonary Disease

## 2023-06-09 ENCOUNTER — Encounter (HOSPITAL_BASED_OUTPATIENT_CLINIC_OR_DEPARTMENT_OTHER): Payer: Self-pay | Admitting: Pulmonary Disease

## 2023-06-09 VITALS — BP 138/82 | HR 72 | Resp 18 | Ht 71.0 in | Wt 313.9 lb

## 2023-06-09 DIAGNOSIS — Z23 Encounter for immunization: Secondary | ICD-10-CM

## 2023-06-09 DIAGNOSIS — J9612 Chronic respiratory failure with hypercapnia: Secondary | ICD-10-CM | POA: Diagnosis not present

## 2023-06-09 DIAGNOSIS — G4733 Obstructive sleep apnea (adult) (pediatric): Secondary | ICD-10-CM | POA: Diagnosis not present

## 2023-06-09 DIAGNOSIS — E662 Morbid (severe) obesity with alveolar hypoventilation: Secondary | ICD-10-CM

## 2023-06-09 DIAGNOSIS — J9611 Chronic respiratory failure with hypoxia: Secondary | ICD-10-CM

## 2023-06-09 NOTE — Addendum Note (Signed)
Addended by: Jama Flavors on: 06/09/2023 01:04 PM   Modules accepted: Orders

## 2023-06-09 NOTE — Progress Notes (Signed)
[place] [facility]  Chief Complaint  Patient presents with   Follow-up    Recert for oxygen    Past Surgical History:  He  has a past surgical history that includes No past surgeries; Esophagogastroduodenoscopy (egd) with propofol (Left, 02/25/2019); Colonoscopy with propofol (Left, 02/25/2019); RIGHT HEART CATH (N/A, 04/17/2019); Esophagogastroduodenoscopy (egd) with propofol (N/A, 06/12/2019); Flexible sigmoidoscopy (N/A, 06/12/2019); Hemorrhoid banding (05/2019); TEE without cardioversion (N/A, 08/21/2020); and Cardioversion (N/A, 08/21/2020).  Past Medical History:  A fib, Anemia, Diastolic CHF, DM, HTN, PE, Rectal bleeding  Constitutional:  BP 138/82   Pulse 72   Resp 18   Ht 5\' 11"  (1.803 m)   Wt (!) 313 lb 14.4 oz (142.4 kg)   SpO2 94%   BMI 43.78 kg/m   Brief Summary:  Benjamin [name] is a 57 y.o. male former smoker with obstructive sleep apnea, obesity hypoventilation syndrome, chronic respiratory failure and chronic bronchitis.      Subjective:   SpO2 on room air at rest was 87%.  He was recovered on 3 liters oxygen to above 92%.  He has been more compliant with using Bipap.  He has been working on his weight.  Has lost about 40 lbs since I last saw him in June 2023.    Not having cough, wheeze, or sputum.  Denies chest pain.  Leg swelling better.  Physical Exam:   Appearance - well kempt, wearing oxygen  ENMT - no sinus tenderness, no oral exudate, no LAN, Mallampati 4 airway, no stridor  Respiratory - equal breath sounds bilaterally, no wheezing or rales  CV - s1s2 regular rate and rhythm, no murmurs  Ext - no clubbing, no edema  Skin - no rashes  Psych - normal mood and affect   Pulmonary testing:  ABG RA 04/19/19 >> pH 7.44, PCO2 49.5, PO2 59.9  Chest Imaging:  CT angio chest 01/21/19 >> b/l lower lobe PE CT angio chest 02/26/19 >> no PE  Sleep Tests:  PSG 05/17/19 >> AHI 128.1, SpO2 low 51%.  CPAP 17 cm H2O with  2 liters oxygen. Bipap titration 11/25/20 >> Bipap 28/24 cm H2O with 2 liters O2 >> AHI 4.2.  Cardiac Tests:  Echo 01/22/19 >> EF 60 to 65%, mild LVH RHC 04/17/19 >> pulmonary venous hypertension Echo 05/26/23 >> EF 70 to 75%, severe LVH  Social History:  He  reports that he has quit smoking. He has never used smokeless tobacco. He reports that he does not currently use alcohol. He reports current drug use. Drugs: Marijuana and Cocaine.  Family History:  His family history includes Diabetes in his mother; Hypertension in his mother.     Assessment/Plan:   Obstructive sleep apnea. - he reports benefit from CPAP therapy - he uses [employer] for his DME - continue Bipap - will renew prescription for mask and supplies  Chronic bronchitis. - prn combivent - prn albuterol by nebulizer, and will arrange for a new nebulizer machine   Chronic hypoxic/hypercapnic respiratory failure from obesity hypoventilation syndrome. - goal SpO2 > 90% - uses 3 liters 24/7   Morbid obesity. - encouraged him to keep up with his weight loss efforts   Chronic diastolic CHF, Paroxysmal atrial fibrillation. - followed by Dr. Dietrich Pates and Dr. Marca Ancona with [facility]  Time Spent Involved in Patient Care on Day of Examination:  28 minutes  Follow up:   Patient Instructions  Flu shot today  Follow up in 6 months  Medication List:   Allergies as of 06/09/2023   No Known Allergies      Medication List        Accurate as of June 09, 2023 10:35 AM. If you have any questions, ask your nurse or doctor.          albuterol (2.5 MG/3ML) 0.083% nebulizer solution Commonly known as: PROVENTIL INHALE 1 VIAL BY NEBULIZATION EVERY 6 (SIX) HOURS AS NEEDED FOR WHEEZING OR SHORTNESS OF BREATH. What changed: See the new instructions.   atorvastatin 40 MG tablet Commonly known as: LIPITOR TAKE 1 TABLET (40 MG TOTAL) BY MOUTH AT BEDTIME.   Blood Pressure Monitor Devi Please provide  patient with insurance approved blood pressure device with L-XL cuff. BMI 55   Combivent Respimat 20-100 MCG/ACT Aers respimat Generic drug: Ipratropium-Albuterol INHALE 1 PUFF INTO THE LUNGS EVERY 6 (SIX) HOURS. What changed:  when to take this reasons to take this   diltiazem 360 MG 24 hr capsule Commonly known as: CARDIZEM CD TAKE 1 CAPSULE (360 MG TOTAL) BY MOUTH DAILY. (NOON)   FeroSul 325 (65 Fe) MG tablet Generic drug: ferrous sulfate TAKE 1 TABLET (325 MG TOTAL) BY MOUTH DAILY AT NOON.   hydrALAZINE 50 MG tablet Commonly known as: APRESOLINE TAKE 1 TABLET (50 MG TOTAL) BY MOUTH 3 (THREE) TIMES DAILY. (NOON,EVENING,BEDTIME)   isosorbide mononitrate 30 MG 24 hr tablet Commonly known as: IMDUR Take 1 tablet (30 mg total) by mouth daily.   Jardiance 10 MG Tabs tablet Generic drug: empagliflozin TAKE 1 TABLET (10 MG TOTAL) BY MOUTH DAILY BEFORE BREAKFAST (AM)   losartan 50 MG tablet Commonly known as: COZAAR TAKE 1 TABLET (50 MG TOTAL) BY MOUTH DAILY. (NOON)   magnesium oxide 400 (240 Mg) MG tablet Commonly known as: MAG-OX TAKE 1 TABLET (400 MG TOTAL) BY MOUTH 2 (TWO) TIMES DAILY (NOON+BEDTIME)   metFORMIN 500 MG tablet Commonly known as: GLUCOPHAGE TAKE 1 TABLET BY MOUTH 2 (TWO) TIMES DAILY WITH A MEAL(NOON+BEDTIME)   omeprazole 20 MG capsule Commonly known as: PRILOSEC TAKE 1 CAPSULE 2 (TWO) TIMES DAILY BEFORE A MEAL. TAKE FIRST CAPSULE 30 MIN PRIOR TO EATING OR TAKING OTHER MEDICATIONS. (AM+BEDTIME)   OXYGEN Inhale 2-3 L into the lungs continuous.   potassium chloride 10 MEQ tablet Commonly known as: KLOR-CON Take 4 tablets (40 mEq total) by mouth daily.   spironolactone 50 MG tablet Commonly known as: ALDACTONE TAKE 1 TABLET (50 MG TOTAL) BY MOUTH DAILY. (NOON) What changed: See the new instructions.   tamsulosin 0.4 MG Caps capsule Commonly known as: FLOMAX Take 0.4 mg by mouth daily after supper.   torsemide 20 MG tablet Commonly known as:  DEMADEX Take 3 tablets (60 mg total) by mouth daily.   True Metrix Blood Glucose Test test strip Generic drug: glucose blood Use as instructed. Check blood glucose level by fingerstick twice per day.   True Metrix Meter w/Device Kit Use as instructed. Check blood glucose level by fingerstick twice per day.   TRUEplus Lancets 28G Misc Use as instructed. Check blood glucose level by fingerstick twice per day.   Xarelto 20 MG Tabs tablet Generic drug: rivaroxaban TAKE 1 TABLET (20 MG TOTAL) BY MOUTH DAILY WITH SUPPER (BEDTIME)        Signature:  Coralyn Helling, MD [place] [facility] Pager - [phone] 06/09/2023, 10:35 AM

## 2023-06-09 NOTE — Patient Instructions (Signed)
Flu shot today Follow up in 6 months

## 2023-06-12 ENCOUNTER — Ambulatory Visit: Payer: MEDICAID | Admitting: Nurse Practitioner

## 2023-06-14 ENCOUNTER — Encounter (HOSPITAL_COMMUNITY): Payer: MEDICAID

## 2023-08-07 ENCOUNTER — Other Ambulatory Visit (HOSPITAL_COMMUNITY): Payer: Self-pay | Admitting: Family Medicine

## 2023-08-07 ENCOUNTER — Other Ambulatory Visit: Payer: Self-pay | Admitting: Family Medicine

## 2023-08-07 DIAGNOSIS — E669 Obesity, unspecified: Secondary | ICD-10-CM

## 2023-08-08 NOTE — Telephone Encounter (Signed)
Requested medication (s) are due for refill today - yes  Requested medication (s) are on the active medication list -yes  Future visit scheduled -no  Last refill: 04/12/23 #60  Notes to clinic: Attempted to call patient to schedule appointment- no answer- left message to call office. Request sent to office for review .   Requested Prescriptions  Pending Prescriptions Disp Refills   metFORMIN (GLUCOPHAGE) 500 MG tablet [Pharmacy Med Name: METFORMIN HCL 500 MG ORAL TABLET] 60 tablet 0    Sig: TAKE 1 TABLET BY MOUTH 2 (TWO) TIMES DAILY WITH A MEAL(NOON+BEDTIME)     Endocrinology:  Diabetes - Biguanides Failed - 08/07/2023  4:44 PM      Failed - Cr in normal range and within 360 days    Creatinine, Ser  Date Value Ref Range Status  05/27/2023 1.47 (H) 0.61 - 1.24 mg/dL Final   Creatinine, Urine  Date Value Ref Range Status  02/08/2019 226.67 mg/dL Final    Comment:    Performed at [facility], [address], [place], [place] [address]         Failed - eGFR in normal range and within 360 days    GFR calc Af Amer  Date Value Ref Range Status  03/02/2020 >60 >60 mL/min Final   GFR, Estimated  Date Value Ref Range Status  05/27/2023 55 (L) >60 mL/min Final    Comment:    (NOTE) Calculated using the CKD-EPI Creatinine Equation (2021)    eGFR  Date Value Ref Range Status  03/30/2022 62 >59 mL/min/1.73 Final         Failed - B12 Level in normal range and within 720 days    No results found for: "VITAMINB12"       Failed - Valid encounter within last 6 months    Recent Outpatient Visits           10 months ago Primary hypertension   [facility] [facility] [place]. [facility] Claiborne Rigg, NP   1 year ago Essential hypertension   [facility] [place] [facility] [place]. [facility] Claiborne Rigg, NP   2 years ago Essential hypertension   [facility] [place] [facility] [place].  [facility] Claiborne Rigg, NP   2 years ago Wound of abdomen   [place] [facility] [place] [facility] [place]. [facility] Claiborne Rigg, NP   2 years ago Hospital discharge follow-up   [facility] [place] [facility] [place]. [facility] [place], [place] W, NP              Passed - HBA1C is between 0 and 7.9 and within 180 days    Hgb A1c MFr Bld  Date Value Ref Range Status  05/25/2023 6.7 (H) 4.8 - 5.6 % Final    Comment:    (NOTE) Pre diabetes:          5.7%-6.4%  Diabetes:              >6.4%  Glycemic control for   <7.0% adults with diabetes          Passed - CBC within normal limits and completed in the last 12 months    WBC  Date Value Ref Range Status  05/26/2023 6.0 4.0 - 10.5 K/uL Final   RBC  Date Value Ref Range Status  05/26/2023 5.61 4.22 - 5.81 MIL/uL Final   Hemoglobin  Date Value Ref Range Status  05/26/2023 13.6 13.0 - 17.0 g/dL Final  95/28/4132 44.0 (L) 13.0 - 17.7 g/dL Final   Total hemoglobin  Date Value Ref Range Status  08/02/2019 10.1 (L) 12.0 - 16.0 g/dL Final   HCT  Date Value Ref Range Status  05/26/2023 44.5 39.0 - 52.0 % Final   Hematocrit  Date Value Ref Range Status  12/21/2020 39.9 37.5 - 51.0 % Final   MCHC  Date Value Ref Range Status  05/26/2023 30.6 30.0 - 36.0 g/dL Final   [facility]  Date Value Ref Range Status  05/26/2023 24.2 (L) 26.0 - 34.0 pg Final   MCV  Date Value Ref Range Status  05/26/2023 79.3 (L) 80.0 - 100.0 fL Final  12/21/2020 79 79 - 97 fL Final   No results found for: "PLTCOUNTKUC", "LABPLAT", "POCPLA" RDW  Date Value Ref Range Status  05/26/2023 14.9 11.5 - 15.5 % Final  12/21/2020 14.7 11.6 - 15.4 % Final            Requested Prescriptions  Pending Prescriptions Disp Refills   metFORMIN (GLUCOPHAGE) 500 MG tablet [Pharmacy Med Name: METFORMIN HCL 500 MG ORAL TABLET] 60 tablet 0    Sig: TAKE 1 TABLET BY MOUTH 2 (TWO) TIMES DAILY WITH A  MEAL(NOON+BEDTIME)     Endocrinology:  Diabetes - Biguanides Failed - 08/07/2023  4:44 PM      Failed - Cr in normal range and within 360 days    Creatinine, Ser  Date Value Ref Range Status  05/27/2023 1.47 (H) 0.61 - 1.24 mg/dL Final   Creatinine, Urine  Date Value Ref Range Status  02/08/2019 226.67 mg/dL Final    Comment:    Performed at [facility], [address] [place], [place] [address]         Failed - eGFR in normal range and within 360 days    GFR calc Af Amer  Date Value Ref Range Status  03/02/2020 >60 >60 mL/min Final   GFR, Estimated  Date Value Ref Range Status  05/27/2023 55 (L) >60 mL/min Final    Comment:    (NOTE) Calculated using the CKD-EPI Creatinine Equation (2021)    eGFR  Date Value Ref Range Status  03/30/2022 62 >59 mL/min/1.73 Final         Failed - B12 Level in normal range and within 720 days    No results found for: "VITAMINB12"       Failed - Valid encounter within last 6 months    Recent Outpatient Visits           10 months ago Primary hypertension   [place] [facility] [place] [facility] [place]. [facility] Claiborne Rigg, NP   1 year ago Essential hypertension   [facility] [facility] [place] [facility] [place]. [facility] Claiborne Rigg, NP   2 years ago Essential hypertension   [place] [facility] [place] [facility] [place]. [facility] Claiborne Rigg, NP   2 years ago Wound of abdomen   [place] [place] [facility] [place]. [facility] Claiborne Rigg, NP   2 years ago Hospital discharge follow-up   [facility] [place] [facility] [place]. [facility] [place], [place],  NP              Passed - HBA1C is between 0 and 7.9 and within 180 days    Hgb A1c MFr Bld  Date Value Ref Range Status  05/25/2023 6.7 (H) 4.8 - 5.6 % Final    Comment:    (NOTE) Pre diabetes:          5.7%-6.4%  Diabetes:               >6.4%  Glycemic control for   <7.0% adults with diabetes          Passed - CBC within normal limits and completed in the last 12 months    WBC  Date Value Ref Range Status  05/26/2023 6.0 4.0 - 10.5 K/uL Final   RBC  Date Value Ref Range Status  05/26/2023 5.61 4.22 - 5.81 MIL/uL Final   Hemoglobin  Date Value Ref Range Status  05/26/2023 13.6 13.0 - 17.0 g/dL Final  62/13/0865 78.4 (L) 13.0 - 17.7 g/dL Final   Total hemoglobin  Date Value Ref Range Status  08/02/2019 10.1 (L) 12.0 - 16.0 g/dL Final   HCT  Date Value Ref Range Status  05/26/2023 44.5 39.0 - 52.0 % Final   Hematocrit  Date Value Ref Range Status  12/21/2020 39.9 37.5 - 51.0 % Final   MCHC  Date Value Ref Range Status  05/26/2023 30.6 30.0 - 36.0 g/dL Final   [facility]  Date Value Ref Range Status  05/26/2023 24.2 (L) 26.0 - 34.0 pg Final   MCV  Date Value Ref Range Status  05/26/2023 79.3 (L) 80.0 - 100.0 fL Final  12/21/2020 79 79 - 97 fL Final   No results found for: "PLTCOUNTKUC", "LABPLAT", "POCPLA" RDW  Date Value Ref Range Status  05/26/2023 14.9 11.5 - 15.5 % Final  12/21/2020 14.7 11.6 - 15.4 % Final

## 2023-08-09 ENCOUNTER — Telehealth (HOSPITAL_COMMUNITY): Payer: Self-pay | Admitting: Licensed Clinical Social Worker

## 2023-08-09 NOTE — Telephone Encounter (Signed)
H&V Care Navigation CSW Progress Note  Clinical Social Worker received call from pt requesting help with getting rescheduled for appts.  CSW assisted patient in setting up appts with [facility], foot and ankle doctor, and PCP  Patient provided with number for [facility] and will call to arrange transport to upcoming appts  Patient still struggling somewhat with mental health and substance abuse and parole officer is still wanting him to establish with provider for this.  Had issues with [facility] and [facility] but is agreeable to reestablish with [facility].  Called their number but they require all those who have not seen a provider in over 90 days to come to walk in hours- patient provided with that information.  Patient also missed appt with [facility] to get fitted for diabetic shoes- left VM for them to get patient rescheduled.   SDOH Screenings   Food Insecurity: No Food Insecurity (05/26/2023)  Housing: Low Risk  (05/26/2023)  Transportation Needs: No Transportation Needs (05/26/2023)  Utilities: Not At Risk (05/26/2023)  Depression (PHQ2-9): High Risk (10/04/2022)  Financial Resource Strain: High Risk (02/27/2023)  Social Connections: Unknown (03/12/2023)   [facility] from [facility]  Tobacco Use: Medium Risk (06/09/2023)

## 2023-08-11 ENCOUNTER — Ambulatory Visit: Payer: MEDICAID | Attending: Nurse Practitioner | Admitting: Nurse Practitioner

## 2023-08-11 ENCOUNTER — Encounter: Payer: Self-pay | Admitting: Nurse Practitioner

## 2023-08-11 VITALS — BP 153/90 | HR 89 | Ht 71.0 in | Wt 342.8 lb

## 2023-08-11 DIAGNOSIS — I5033 Acute on chronic diastolic (congestive) heart failure: Secondary | ICD-10-CM

## 2023-08-11 DIAGNOSIS — J9611 Chronic respiratory failure with hypoxia: Secondary | ICD-10-CM

## 2023-08-11 DIAGNOSIS — E1165 Type 2 diabetes mellitus with hyperglycemia: Secondary | ICD-10-CM | POA: Diagnosis not present

## 2023-08-11 DIAGNOSIS — I10 Essential (primary) hypertension: Secondary | ICD-10-CM

## 2023-08-11 MED ORDER — BLOOD PRESSURE MONITOR DEVI
0 refills | Status: DC
Start: 2023-08-11 — End: 2023-11-13

## 2023-08-11 MED ORDER — ALBUTEROL SULFATE (2.5 MG/3ML) 0.083% IN NEBU: 2.5000 mg | INHALATION_SOLUTION | RESPIRATORY_TRACT | 1 refills | Status: AC | PRN

## 2023-08-11 MED ORDER — MISC. DEVICES MISC
0 refills | Status: DC
Start: 2023-08-11 — End: 2023-11-30

## 2023-08-11 NOTE — Progress Notes (Signed)
Assessment & Plan:  Benjamin [name] was seen today for medical management of chronic issues.  Diagnoses and all orders for this visit:  Essential hypertension -     Blood Pressure Monitor DEVI; Please provide patient with insurance approved blood pressure device with L-XL cuff. BMI 55 Continue all antihypertensives as prescribed.  Reminded to bring in blood pressure log for follow  up appointment.  RECOMMENDATIONS: DASH/Mediterranean Diets are healthier choices for HTN.    Type 2 diabetes mellitus with hyperglycemia, without long-term current use of insulin (HCC) -     Urine Albumin/Creatinine with ratio (send out) [LAB689] -     CMP14+EGFR Continue blood sugar control as discussed in office today, low carbohydrate diet, and regular physical exercise as tolerated, 150 minutes per week (30 min each day, 5 days per week, or 50 min 3 days per week). Keep blood sugar logs with fasting goal of 90-130 mg/dl, post prandial (after you eat) less than 180.  For Hypoglycemia: BS <60 and Hyperglycemia BS >400; contact the clinic ASAP. Annual eye exams and foot exams are recommended.   Chronic respiratory failure with hypoxia (HCC) -     albuterol (PROVENTIL) (2.5 MG/3ML) 0.083% nebulizer solution; Take 3 mLs (2.5 mg total) by nebulization every 4 (four) hours as needed for wheezing or shortness of breath. INHALE 1 VIAL BY NEBULIZATION EVERY 6 (SIX) HOURS AS NEEDED FOR WHEEZING OR SHORTNESS OF BREATH. Strength: (2.5 MG/3ML) 0.083%  Acute on chronic diastolic heart failure (HCC) -     Misc. Devices MISC; Please provide patient with XL sized insurance approved compression stockings. ICD 10 R60.0 and I50.32 -     CMP14+EGFR    Patient has been counseled on age-appropriate routine health concerns for screening and prevention. These are reviewed and up-to-date. Referrals have been placed accordingly. Immunizations are up-to-date or declined.    Subjective:   Chief Complaint  Patient presents with   Medical  Management of Chronic Issues    Benjamin [name] 57 y.o. male presents to office today for follow up to HTN   He has a PMH  of chronic diastolic CHF (EF 40-981) , HTN, DM, Atrial fib, PE in April 2020 (on Xarelto), OSA, GERD, GI bleed due to hemorrhoids, Anemia, chronic elevation of troponin, OHS/OSA: chronic O2 2L during day and BIPAP at night,    For most of the visit today he tells me he has been nonadherent to medications and diet due to have sex with multiple women over the past several weeks.    He is wearing his o2 today (2L) and reports compliance with BIPAP at night  He has chronic BLE swelling and needs custom fitted compression stockings due to his history of CHF.   HTN Blood pressure is not at goal and elevated today. He is currently prescribed diltiazem 360 mg daily, hydralazine 50 mg TID, imdur 30 mg daily, lorsartan 50 mg daily, spirinolactone 50 mg daily and torsemide 60 mg daily. He is not able to monitor his blood pressure at home due to not having a device.  BP Readings from Last 3 Encounters:  08/11/23 (!) 153/90  06/09/23 138/82    DM 2 Diabetes is well controlled. He is prescribed metformin 500 mg BID and jardiance 10 mg daily.  Lab Results  Component Value Date   HGBA1C 6.7 (H) 05/25/2023    Review of Systems  Constitutional:  Negative for fever, malaise/fatigue and weight loss.  HENT: Negative.  Negative for nosebleeds.   Eyes: Negative.  Negative for blurred vision, double vision and photophobia.  Respiratory: Negative.  Negative for cough and shortness of breath.   Cardiovascular:  Positive for leg swelling. Negative for chest pain and palpitations.  Gastrointestinal: Negative.  Negative for heartburn, nausea and vomiting.  Musculoskeletal: Negative.  Negative for myalgias.  Neurological: Negative.  Negative for dizziness, focal weakness, seizures and headaches.  Psychiatric/Behavioral: Negative.  Negative for suicidal ideas.     Past Medical History:   Diagnosis Date   A-fib (HCC)    Anemia 05/29/2019   CHF (congestive heart failure) (HCC)    Chronic respiratory failure (HCC)    Diabetes mellitus without complication (HCC)    Dyspnea    Elevated troponin 02/06/2019   Hypertension    Obesity    Pulmonary embolism (HCC)    Rectal bleeding 05/29/2019   Sleep apnea     Past Surgical History:  Procedure Laterality Date   CARDIOVERSION N/A 08/21/2020   Procedure: CARDIOVERSION;  Surgeon: Laurey Morale, MD;  Location[facility] ENDOSCOPY;  Service: Cardiovascular;  Laterality: N/A;   COLONOSCOPY WITH PROPOFOL Left 02/25/2019   Procedure: COLONOSCOPY WITH PROPOFOL;  Surgeon: [name], MD;  Location[facility] ENDOSCOPY;  Service: Endoscopy;  Laterality: Left;   ESOPHAGOGASTRODUODENOSCOPY (EGD) WITH PROPOFOL Left 02/25/2019   Procedure: ESOPHAGOGASTRODUODENOSCOPY (EGD) WITH PROPOFOL;  Surgeon: [name], MD;  Location: [facility] ENDOSCOPY;  Service: Endoscopy;  Laterality: Left;   ESOPHAGOGASTRODUODENOSCOPY (EGD) WITH PROPOFOL N/A 06/12/2019   Procedure: ESOPHAGOGASTRODUODENOSCOPY (EGD) WITH PROPOFOL;  Surgeon: Vida Rigger, MD;  Location[facility] ENDOSCOPY;  Service: Gastroenterology;  Laterality: N/A;   FLEXIBLE SIGMOIDOSCOPY N/A 06/12/2019   Procedure: FLEXIBLE SIGMOIDOSCOPY;  Surgeon: Vida Rigger, MD;  Location: [facility] ENDOSCOPY;  Service: Gastroenterology;  Laterality: N/A;   HEMORRHOID BANDING  05/2019   NO PAST SURGERIES     RIGHT HEART CATH N/A 04/17/2019   Procedure: RIGHT HEART CATH;  Surgeon: Laurey Morale, MD;  [facility] INVASIVE CV LAB;  Service: Cardiovascular;  Laterality: N/A;   TEE WITHOUT CARDIOVERSION N/A 08/21/2020   Procedure: TRANSESOPHAGEAL ECHOCARDIOGRAM (TEE);  Surgeon: Laurey Morale, MD;  Location: [name] ENDOSCOPY;  Service: Cardiovascular;  Laterality: N/A;    Family History  Problem Relation Age of Onset   Hypertension Mother    Diabetes Mother     Social History Reviewed with no changes to be made today.   Outpatient  Medications Prior to Visit  Medication Sig Dispense Refill   atorvastatin (LIPITOR) 40 MG tablet TAKE 1 TABLET (40 MG TOTAL) BY MOUTH AT BEDTIME. 90 tablet 3   Blood Glucose Monitoring Suppl (TRUE METRIX METER) w/Device KIT Use as instructed. Check blood glucose level by fingerstick twice per day. 1 kit 0   COMBIVENT RESPIMAT 20-100 MCG/ACT AERS respimat INHALE 1 PUFF INTO THE LUNGS EVERY 6 (SIX) HOURS. (Patient taking differently: Inhale 1 puff into the lungs every 6 (six) hours as needed for shortness of breath.) 4 g 5   diltiazem (CARDIZEM CD) 360 MG 24 hr capsule TAKE 1 CAPSULE (360 MG TOTAL) BY MOUTH DAILY. (NOON) 90 capsule 3   empagliflozin (JARDIANCE) 10 MG TABS tablet Take 1 tablet (10 mg total) by mouth daily. NEEDS FOLLOW UP APPOINTMENT FOR MORE REFILLS 30 tablet 3   ferrous sulfate (FEROSUL) 325 (65 FE) MG tablet TAKE 1 TABLET (325 MG TOTAL) BY MOUTH DAILY AT NOON. 30 tablet 0   glucose blood (TRUE METRIX BLOOD GLUCOSE TEST) test strip Use as instructed. Check blood glucose level by fingerstick twice per day. 100 each  12   hydrALAZINE (APRESOLINE) 50 MG tablet TAKE 1 TABLET (50 MG TOTAL) BY MOUTH 3 (THREE) TIMES DAILY. (NOON,EVENING,BEDTIME) 180 tablet 0   losartan (COZAAR) 50 MG tablet TAKE 1 TABLET (50 MG TOTAL) BY MOUTH DAILY. (NOON) 90 tablet 3   magnesium oxide (MAG-OX) 400 (240 Mg) MG tablet TAKE 1 TABLET (400 MG TOTAL) BY MOUTH 2 (TWO) TIMES DAILY (NOON+BEDTIME) 90 tablet 3   metFORMIN (GLUCOPHAGE) 500 MG tablet TAKE 1 TABLET BY MOUTH 2 (TWO) TIMES DAILY WITH A MEAL(NOON+BEDTIME) 180 tablet 0   omeprazole (PRILOSEC) 20 MG capsule TAKE 1 CAPSULE 2 (TWO) TIMES DAILY BEFORE A MEAL. TAKE FIRST CAPSULE 30 MIN PRIOR TO EATING OR TAKING OTHER MEDICATIONS. (AM+BEDTIME) 180 capsule 1   OXYGEN Inhale 2-3 L into the lungs continuous.     potassium chloride (KLOR-CON) 10 MEQ tablet Take 4 tablets (40 mEq total) by mouth daily.     spironolactone (ALDACTONE) 50 MG tablet TAKE 1 TABLET (50 MG  TOTAL) BY MOUTH DAILY. (NOON) (Patient taking differently: Take 50 mg by mouth daily.) 30 tablet 11   tamsulosin (FLOMAX) 0.4 MG CAPS capsule Take 0.4 mg by mouth daily after supper.     torsemide (DEMADEX) 20 MG tablet Take 3 tablets (60 mg total) by mouth daily.     TRUEplus Lancets 28G MISC Use as instructed. Check blood glucose level by fingerstick twice per day. 200 each 3   XARELTO 20 MG TABS tablet TAKE 1 TABLET (20 MG TOTAL) BY MOUTH DAILY WITH SUPPER (BEDTIME) 90 tablet 3   albuterol (PROVENTIL) (2.5 MG/3ML) 0.083% nebulizer solution INHALE 1 VIAL BY NEBULIZATION EVERY 6 (SIX) HOURS AS NEEDED FOR WHEEZING OR SHORTNESS OF BREATH. (Patient taking differently: Take 2.5 mg by nebulization every 6 (six) hours as needed for shortness of breath.) 150 mL 1   Blood Pressure Monitor DEVI Please provide patient with insurance approved blood pressure device with L-XL cuff. BMI 55 1 each 0   isosorbide mononitrate (IMDUR) 30 MG 24 hr tablet Take 1 tablet (30 mg total) by mouth daily. 30 tablet 0   No facility-administered medications prior to visit.    No Known Allergies     Objective:    BP (!) 153/90   Pulse 89   Ht 5\' 11"  (1.803 m)   Wt (!) 342 lb 12.8 oz (155.5 kg)   SpO2 91%   BMI 47.81 kg/m  Wt Readings from Last 3 Encounters:  08/11/23 (!) 342 lb 12.8 oz (155.5 kg)  06/09/23 (!) 313 lb 14.4 oz (142.4 kg)  05/27/23 (!) 314 lb 6 oz (142.6 kg)    Physical Exam Vitals and nursing note reviewed.  Constitutional:      Appearance: He is well-developed.  HENT:     Head: Normocephalic and atraumatic.  Cardiovascular:     Rate and Rhythm: Normal rate and regular rhythm.     Heart sounds: Normal heart sounds. No murmur heard.    No friction rub. No gallop.  Pulmonary:     Effort: Pulmonary effort is normal. No tachypnea or respiratory distress.     Breath sounds: Normal breath sounds. No decreased breath sounds, wheezing, rhonchi or rales.  Chest:     Chest wall: No tenderness.   Abdominal:     General: Bowel sounds are normal.     Palpations: Abdomen is soft.  Musculoskeletal:        General: Normal range of motion.     Cervical back: Normal range of motion.  Right lower leg: Edema (no pitting) present.     Left lower leg: Edema (no pitting) present.  Skin:    General: Skin is warm and dry.  Neurological:     Mental Status: He is alert and oriented to person, place, and time.     Coordination: Coordination normal.  Psychiatric:        Behavior: Behavior normal. Behavior is cooperative.        Thought Content: Thought content normal.        Judgment: Judgment normal.          Patient has been counseled extensively about nutrition and exercise as well as the importance of adherence with medications and regular follow-up. The patient was given clear instructions to go to ER or return to medical center if symptoms don't improve, worsen or new problems develop. The patient verbalized understanding.   Follow-up: Return in about 3 months (around 11/11/2023).   Claiborne Rigg, FNP-BC [facility] and [facility] [place], [place] [phone]   09/04/2023, 12:33 AM

## 2023-08-14 LAB — CMP14+EGFR
ALT: 18 [IU]/L (ref 0–44)
AST: 19 [IU]/L (ref 0–40)
Albumin: 4 g/dL (ref 3.8–4.9)
Alkaline Phosphatase: 63 [IU]/L (ref 44–121)
BUN/Creatinine Ratio: 15 (ref 9–20)
BUN: 17 mg/dL (ref 6–24)
Bilirubin Total: <0.2 mg/dL (ref 0.0–1.2)
CO2: 25 mmol/L (ref 20–29)
Calcium: 9.5 mg/dL (ref 8.7–10.2)
Chloride: 103 mmol/L (ref 96–106)
Creatinine, Ser: 1.12 mg/dL (ref 0.76–1.27)
Globulin, Total: 3.4 g/dL (ref 1.5–4.5)
Glucose: 115 mg/dL — ABNORMAL HIGH (ref 70–99)
Potassium: 4.1 mmol/L (ref 3.5–5.2)
Sodium: 141 mmol/L (ref 134–144)
Total Protein: 7.4 g/dL (ref 6.0–8.5)
eGFR: 77 mL/min/{1.73_m2} (ref >59)

## 2023-08-14 LAB — MICROALBUMIN / CREATININE URINE RATIO
Creatinine, Urine: 202.2 mg/dL
Microalb/Creat Ratio: 43 mg/g{creat} — ABNORMAL HIGH (ref 0–29)
Microalbumin, Urine: 87.6 ug/mL

## 2023-08-25 ENCOUNTER — Telehealth (HOSPITAL_COMMUNITY): Payer: Self-pay | Admitting: Licensed Clinical Social Worker

## 2023-08-25 NOTE — Telephone Encounter (Addendum)
H&V Care Navigation CSW Progress Note  Clinical Social Worker reached out to patient to confirm he is aware of upcoming appts and has set up transportation.  Confirms he is aware of appts but has not set up rides yet- provided with number to [facility] for him to do so.  Will continue to follow and assist as needed  SDOH Screenings   Food Insecurity: No Food Insecurity (05/26/2023)  Housing: Low Risk  (05/26/2023)  Transportation Needs: No Transportation Needs (05/26/2023)  Utilities: Not At Risk (05/26/2023)  Depression (PHQ2-9): Low Risk  (08/11/2023)  Financial Resource Strain: High Risk (02/27/2023)  Social Connections: Unknown (03/12/2023)   Received from [facility]  Tobacco Use: Medium Risk (08/11/2023)     [name], LCSW Clinical Social Worker [facility] Desk#: [phone] Cell#: [phone]

## 2023-09-01 ENCOUNTER — Ambulatory Visit: Payer: MEDICAID | Admitting: Podiatry

## 2023-09-01 ENCOUNTER — Encounter (HOSPITAL_COMMUNITY): Payer: Self-pay

## 2023-09-01 ENCOUNTER — Emergency Department (HOSPITAL_COMMUNITY)
Admission: EM | Admit: 2023-09-01 | Discharge: 2023-09-01 | Disposition: A | Discharge status: Home / Self Care | Payer: MEDICAID | Attending: Emergency Medicine | Admitting: Emergency Medicine

## 2023-09-01 ENCOUNTER — Other Ambulatory Visit: Payer: Self-pay

## 2023-09-01 DIAGNOSIS — Z79899 Other long term (current) drug therapy: Secondary | ICD-10-CM | POA: Diagnosis not present

## 2023-09-01 DIAGNOSIS — Z7984 Long term (current) use of oral hypoglycemic drugs: Secondary | ICD-10-CM | POA: Diagnosis not present

## 2023-09-01 DIAGNOSIS — R7989 Other specified abnormal findings of blood chemistry: Secondary | ICD-10-CM | POA: Insufficient documentation

## 2023-09-01 DIAGNOSIS — Z7901 Long term (current) use of anticoagulants: Secondary | ICD-10-CM | POA: Diagnosis not present

## 2023-09-01 DIAGNOSIS — I4891 Unspecified atrial fibrillation: Secondary | ICD-10-CM | POA: Insufficient documentation

## 2023-09-01 DIAGNOSIS — E119 Type 2 diabetes mellitus without complications: Secondary | ICD-10-CM | POA: Diagnosis not present

## 2023-09-01 DIAGNOSIS — I10 Essential (primary) hypertension: Secondary | ICD-10-CM | POA: Insufficient documentation

## 2023-09-01 LAB — I-STAT VENOUS BLOOD GAS, ED
Acid-Base Excess: 2 mmol/L (ref 0.0–2.0)
Bicarbonate: 29.8 mmol/L — ABNORMAL HIGH (ref 20.0–28.0)
Calcium, Ion: 1.15 mmol/L (ref 1.15–1.40)
HCT: 50 % (ref 39.0–52.0)
Hemoglobin: 17 g/dL (ref 13.0–17.0)
O2 Saturation: 92 %
Potassium: 4.1 mmol/L (ref 3.5–5.1)
Sodium: 140 mmol/L (ref 135–145)
TCO2: 32 mmol/L (ref 22–32)
pCO2, Ven: 58.3 mm[Hg] (ref 44–60)
pH, Ven: 7.317 (ref 7.25–7.43)
pO2, Ven: 72 mm[Hg] — ABNORMAL HIGH (ref 32–45)

## 2023-09-01 LAB — BASIC METABOLIC PANEL
Anion gap: 7 (ref 5–15)
BUN: 14 mg/dL (ref 6–20)
CO2: 25 mmol/L (ref 22–32)
Calcium: 8.8 mg/dL — ABNORMAL LOW (ref 8.9–10.3)
Chloride: 105 mmol/L (ref 98–111)
Creatinine, Ser: 1.09 mg/dL (ref 0.61–1.24)
GFR, Estimated: >60 mL/min (ref >60)
Glucose, Bld: 112 mg/dL — ABNORMAL HIGH (ref 70–99)
Potassium: 4.5 mmol/L (ref 3.5–5.1)
Sodium: 137 mmol/L (ref 135–145)

## 2023-09-01 LAB — CBC WITH DIFFERENTIAL/PLATELET
Abs Immature Granulocytes: 0.02 10*3/uL (ref 0.00–0.07)
Basophils Absolute: 0 10*3/uL (ref 0.0–0.1)
Basophils Relative: 0 %
Eosinophils Absolute: 0.3 10*3/uL (ref 0.0–0.5)
Eosinophils Relative: 4 %
HCT: 49 % (ref 39.0–52.0)
Hemoglobin: 14.6 g/dL (ref 13.0–17.0)
Immature Granulocytes: 0 %
Lymphocytes Relative: 21 %
Lymphs Abs: 1.7 10*3/uL (ref 0.7–4.0)
MCH: 24.7 pg — ABNORMAL LOW (ref 26.0–34.0)
MCHC: 29.8 g/dL — ABNORMAL LOW (ref 30.0–36.0)
MCV: 82.9 fL (ref 80.0–100.0)
Monocytes Absolute: 0.7 10*3/uL (ref 0.1–1.0)
Monocytes Relative: 9 %
Neutro Abs: 5.3 10*3/uL (ref 1.7–7.7)
Neutrophils Relative %: 66 %
Platelets: 323 10*3/uL (ref 150–400)
RBC: 5.91 MIL/uL — ABNORMAL HIGH (ref 4.22–5.81)
RDW: 15.7 % — ABNORMAL HIGH (ref 11.5–15.5)
WBC: 8.1 10*3/uL (ref 4.0–10.5)
nRBC: 0 % (ref 0.0–0.2)

## 2023-09-01 LAB — TROPONIN I (HIGH SENSITIVITY)
Troponin I (High Sensitivity): 29 ng/L — ABNORMAL HIGH (ref <18)
Troponin I (High Sensitivity): 36 ng/L — ABNORMAL HIGH (ref <18)

## 2023-09-01 LAB — MAGNESIUM: Magnesium: 2.1 mg/dL (ref 1.7–2.4)

## 2023-09-01 NOTE — ED Provider Notes (Signed)
[place] EMERGENCY DEPARTMENT AT [facility] Provider Note   CSN: [id number] Arrival date & time: 09/01/23  1138     History  Chief Complaint  Patient presents with   Atrial Fibrillation    Benjamin [name] is a 57 y.o. male.  Patient is a 57 year old male with a past medical history of paroxysmal A-fib on Xarelto, OSA on BiPAP at night hypertension, diabetes presenting to the emergency department with A-fib with RVR. Per EMS, they were called out for chest pain, SOB and palpitations.  On EMS arrival he was found to be in A-fib with RVR with a rate in the 200s.  He was cardioverted by EMS with 200 J and was given 5 of IM said for sedation and returned back to sinus rhythm.  Patient is drowsy post sedation on his arrival and further history is limited due to his mental status.  The history is provided by the EMS personnel. The history is limited by the condition of the patient.  Atrial Fibrillation       Home Medications Prior to Admission medications   Medication Sig Start Date End Date Taking? Authorizing Provider  albuterol (PROVENTIL) (2.5 MG/3ML) 0.083% nebulizer solution Take 3 mLs (2.5 mg total) by nebulization every 4 (four) hours as needed for wheezing or shortness of breath. INHALE 1 VIAL BY NEBULIZATION EVERY 6 (SIX) HOURS AS NEEDED FOR WHEEZING OR SHORTNESS OF BREATH. Strength: (2.5 MG/3ML) 0.083% 08/11/23   Claiborne Rigg, NP  atorvastatin (LIPITOR) 40 MG tablet TAKE 1 TABLET (40 MG TOTAL) BY MOUTH AT BEDTIME. 04/12/23   Milford, Anderson Malta, FNP  Blood Glucose Monitoring Suppl (TRUE METRIX METER) w/Device KIT Use as instructed. Check blood glucose level by fingerstick twice per day. 05/30/23   Claiborne Rigg, NP  Blood Pressure Monitor DEVI Please provide patient with insurance approved blood pressure device with L-XL cuff. BMI 55 08/11/23   Claiborne Rigg, NP  COMBIVENT RESPIMAT 20-100 MCG/ACT AERS respimat INHALE 1 PUFF INTO THE LUNGS EVERY 6 (SIX)  HOURS. Patient taking differently: Inhale 1 puff into the lungs every 6 (six) hours as needed for shortness of breath. 09/15/22   Coralyn Helling, MD  diltiazem (CARDIZEM CD) 360 MG 24 hr capsule TAKE 1 CAPSULE (360 MG TOTAL) BY MOUTH DAILY. (NOON) 10/18/22   Milford, Anderson Malta, FNP  empagliflozin (JARDIANCE) 10 MG TABS tablet Take 1 tablet (10 mg total) by mouth daily. NEEDS FOLLOW UP APPOINTMENT FOR MORE REFILLS 08/08/23   Jacklynn Ganong, FNP  ferrous sulfate (FEROSUL) 325 (65 FE) MG tablet TAKE 1 TABLET (325 MG TOTAL) BY MOUTH DAILY AT NOON. 04/12/23   Hoy Register, MD  glucose blood (TRUE METRIX BLOOD GLUCOSE TEST) test strip Use as instructed. Check blood glucose level by fingerstick twice per day. 05/30/23   Claiborne Rigg, NP  hydrALAZINE (APRESOLINE) 50 MG tablet TAKE 1 TABLET (50 MG TOTAL) BY MOUTH 3 (THREE) TIMES DAILY. (NOON,EVENING,BEDTIME) 06/02/23   Milford, Anderson Malta, FNP  isosorbide mononitrate (IMDUR) 30 MG 24 hr tablet Take 1 tablet (30 mg total) by mouth daily. 05/28/23 06/27/23  Arrien, York Ram, MD  losartan (COZAAR) 50 MG tablet TAKE 1 TABLET (50 MG TOTAL) BY MOUTH DAILY. (NOON) 10/18/22   Milford, Anderson Malta, FNP  magnesium oxide (MAG-OX) 400 (240 Mg) MG tablet TAKE 1 TABLET (400 MG TOTAL) BY MOUTH 2 (TWO) TIMES DAILY (NOON+BEDTIME) 12/09/22   Milford, Anderson Malta, FNP  metFORMIN (GLUCOPHAGE) 500 MG tablet TAKE 1 TABLET BY  MOUTH 2 (TWO) TIMES DAILY WITH A MEAL(NOON+BEDTIME) 08/08/23   Claiborne Rigg, NP  Misc. Devices MISC Please provide patient with XL sized insurance approved compression stockings. ICD 10 R60.0 and I50.32 08/11/23   Claiborne Rigg, NP  omeprazole (PRILOSEC) 20 MG capsule TAKE 1 CAPSULE 2 (TWO) TIMES DAILY BEFORE A MEAL. TAKE FIRST CAPSULE 30 MIN PRIOR TO EATING OR TAKING OTHER MEDICATIONS. (AM+BEDTIME) 06/01/23   Claiborne Rigg, NP  OXYGEN Inhale 2-3 L into the lungs continuous.    [provider]  potassium chloride (KLOR-CON) 10 MEQ tablet Take  4 tablets (40 mEq total) by mouth daily. 05/27/23   Arrien, York Ram, MD  spironolactone (ALDACTONE) 50 MG tablet TAKE 1 TABLET (50 MG TOTAL) BY MOUTH DAILY. (NOON) Patient taking differently: Take 50 mg by mouth daily. 10/18/22   Jacklynn Ganong, FNP  tamsulosin (FLOMAX) 0.4 MG CAPS capsule Take 0.4 mg by mouth daily after supper.    [provider]  torsemide (DEMADEX) 20 MG tablet Take 3 tablets (60 mg total) by mouth daily. 05/28/23   Arrien, York Ram, MD  TRUEplus Lancets 28G MISC Use as instructed. Check blood glucose level by fingerstick twice per day. 09/22/20   Claiborne Rigg, NP  XARELTO 20 MG TABS tablet TAKE 1 TABLET (20 MG TOTAL) BY MOUTH DAILY WITH SUPPER (BEDTIME) 10/18/22   Milford, Anderson Malta, FNP      Allergies    Patient has no known allergies.    Review of Systems   Review of Systems  Physical Exam Updated Vital Signs BP (!) 182/115   Pulse 79   Temp 99.4 F (37.4 C) (Oral)   Resp 14   SpO2 100%  Physical Exam Vitals and nursing note reviewed.  Constitutional:      Appearance: Normal appearance. He is obese.     Comments: Drowsy, sonorous respirations, improved with jaw thrust  HENT:     Head: Normocephalic and atraumatic.     Nose: Nose normal.     Mouth/Throat:     Mouth: Mucous membranes are moist.  Eyes:     Conjunctiva/sclera: Conjunctivae normal.  Cardiovascular:     Rate and Rhythm: Normal rate and regular rhythm.     Heart sounds: Normal heart sounds.  Pulmonary:     Effort: Pulmonary effort is normal.     Breath sounds: Normal breath sounds.  Abdominal:     General: Abdomen is flat.     Palpations: Abdomen is soft.     Tenderness: There is no abdominal tenderness.  Musculoskeletal:        General: Normal range of motion.     Cervical back: Normal range of motion.     Right lower leg: No edema.     Left lower leg: No edema.  Skin:    General: Skin is warm and dry.  Neurological:     Comments: Arousable to noxious  stimuli but quickly falls back asleep, withdrawing to noxious stimuli     ED Results / Procedures / Treatments   Labs (all labs ordered are listed, but only abnormal results are displayed) Labs Reviewed  CBC WITH DIFFERENTIAL/PLATELET - Abnormal; Notable for the following components:      Result Value   RBC 5.91 (*)    MCH 24.7 (*)    MCHC 29.8 (*)    RDW 15.7 (*)    All other components within normal limits  BASIC METABOLIC PANEL - Abnormal; Notable for the following components:  Glucose, Bld 112 (*)    Calcium 8.8 (*)    All other components within normal limits  I-STAT VENOUS BLOOD GAS, ED - Abnormal; Notable for the following components:   pO2, Ven 72 (*)    Bicarbonate 29.8 (*)    All other components within normal limits  TROPONIN I (HIGH SENSITIVITY) - Abnormal; Notable for the following components:   Troponin I (High Sensitivity) 29 (*)    All other components within normal limits  MAGNESIUM  TROPONIN I (HIGH SENSITIVITY)    EKG EKG Interpretation Date/Time:  Friday September 01 2023 11:45:32 EST Ventricular Rate:  93 PR Interval:  149 QRS Duration:  90 QT Interval:  322 QTC Calculation: 401 R Axis:   79  Text Interpretation: Sinus rhythm Probable left atrial enlargement Nonspecific T abnormalities, lateral leads Minimal ST elevation, anterior leads Inferior T-wave inversions new compared to prior EKG Confirmed by Elayne Snare ([id number]) on 09/01/2023 12:42:19 PM  Radiology No results found.  Procedures Procedures    Medications Ordered in ED Medications - No data to display  ED Course/ Medical Decision Making/ A&P Clinical Course as of 09/01/23 1551  Fri Sep 01, 2023  1242 T-wave inversions inferiorly new compared to prior EKG, troponin added on. [VK]  1331 Labs otherwise within normal range. Patient remains in sinus. Slightly more arousable now but not back to baseline. Will add on VBG for prolonged sedation. [VK]  1410 No hypercapnia on VBG. [VK]   1550 Patient signed out to Dr. Rodena Medin pending repeat troponin and reassessment of mental status back to baseline. [VK]    Clinical Course User Index [VK] Rexford Maus, DO                                 Medical Decision Making This patient presents to the ED with chief complaint(s) of palpitations, chest pain with pertinent past medical history of paroxysmal A-fib on Xarelto, hypertension, diabetes, OSA which further complicates the presenting complaint. The complaint involves an extensive differential diagnosis and also carries with it a high risk of complications and morbidity.    The differential diagnosis includes A-fib with RVR, anemia, electrolyte abnormality, ACS, sedation  Additional history obtained: Additional history obtained from EMS  Records reviewed Primary Care Documents  ED Course and Reassessment: On patient's arrival he is back to sinus rhythm on his EKG.  He does appear very drowsy with sonorous respirations and was placed on nasal cannula.  Improvement with jaw thrust.  I attempted to place a nasal trumpet however the patient awakened and became agitated and pulled it out and then quickly fell back asleep.  Plan will be to continue to monitor the patient till he is back to his neurologic baseline.  Will have labs to evaluate for cause of his A-fib today and will be closely reassessed.  Independent labs interpretation:  The following labs were independently interpreted: mildly elevated troponin, otherwise within normal range  Independent visualization of imaging: - N/A  Consultation: - Consulted or discussed management/test interpretation w/ external professional: N/A    Amount and/or Complexity of Data Reviewed Labs: ordered.          Final Clinical Impression(s) / ED Diagnoses Final diagnoses:  None    Rx / DC Orders ED Discharge Orders     None         Rexford Maus, DO 09/01/23 1551

## 2023-09-01 NOTE — Discharge Instructions (Signed)
Return for any problem.  ?

## 2023-09-01 NOTE — ED Provider Notes (Signed)
Patient seen after prior ED provider.  Patient is now alert and oriented.  He is comfortable.  He is hungry.  Patient is requesting discharge.  He was offered additional observation.  He declines.  He is advised to avoid use of any illicit drugs especially cocaine.  Importance of close follow-up is advised.  Strict return precautions given and understood.   Wynetta Fines, MD 09/01/23 [phone]

## 2023-09-01 NOTE — Progress Notes (Incomplete)
Advanced Heart Failure Clinic Note   PCP: Claiborne Rigg, NP Primary Cardiologist: Dietrich Pates, MD  HF Cardiologist: Dr. Shirlee Latch   HPI: Benjamin [name] is a 57 y.o. with hx of diastolic CHF, HTN, DM, Atrial fib, PE in April 2020 (on Xarelto), OSA, GERD, chronic elevation of troponin.    Admitted 01/21/2019 with increased dyspnea and had PE. He was anticoagulated.    Patient re-admitted 5/20 for syncope/cough/dyspnea, COVID+, CT positive for bilateral GGOs, admitted to [facility] and discharged after 6 days, on O2.  Rec'd Cards f/u.   Patient re-admitted 02/20/19 for bright red blood per rectum, eval'ed by GI, had EGD/colon, dx'd with hemorrhoidal bleeding, discharged after 9 days on O2. He was again Covid + .   Readmitted 03/11/19 with increased shortness of breath. He did not have home oxygen. He was discharged to home the next day.    He presented to [facility] ED 04/11/19 with lower extremity edema. COVID negative. Diuresed with lasix drip and transitioned to torsemide 40 mg twice daily. Had RHC with mild volume overload and preserved cardiac output. Discharge weight was 407.9 pounds.    He was readmitted with symptomatic anemia 05/28/19 with hgb 7.1. Received 1UPRBCs. GI consulted. He started on anusol and continued on stool softner.    Readmitted 07/16/19 with a/c diastolic CHF and treated w/ IV Lasix. Had 2 day hospital stay and was discharged on 10/29. Echo showed normal LVEF 65-70%, RV interpreted as normal but suspect significant RV dysfunction.  After diuresis w/ IV Lasix, he was placed back on home diuretic regimen, torsemide 100 mg bid. Lisinopril 5 mg was also added to regimen. AHF team was not consulted that admit.   He was seen back in clinic 07/30/19. Felt poorly. SOB at rest and worse w/ exertion. Also w/ orthopnea/PND. Significant wt gain of 20 lb up to 428 lb (dry wt ~407 lb), w/ marked abdominal distention and LEE and poor response to IV diuretics.  Once diuresed transitioned to torsemide 100  mg twice a day. D/C 408 pounds.   Zio patch on 10/20/19-No arrhythmia.   Re-admitted 08/19/20-08/24/20 for  A fib RVR. A. fib RVR in the setting of noncompliance ethanol and cocaine. Cardizem started and labetalol continued.  12/3 TEE (EF 60-65%) with DCCV successfully converted to SR. Diuresed with IV lasix and had AKI in setting of over diuresis. SCr 1.59 on admit, bumped to 2.5. Diuretics held + IVF hydration.   Admitted 12/24/20 with hypertensive emergency and respiratory failure. .   Admitted 4/22 with A Fib RVR & AMS. Diltiazem increased to 360 mg daily. Chemically converted to NSR.  + Cocaine.   Seen in ED 11/22 for COPD exacerbation.  Seen 11/02/21 and had been off all meds x 1 week, volume overloaded. Meds restarted and instructed to take metolazone 2.5 x 2 days w/ extra 40 kcl, however labs showed elevated BUN/SCr and left message to take metolazone x 1 day.  Re-enrolled in paramedicine.  Echo 5/23 EF 60-65%, moderate LVH, mid-cavity LV gradient to 29 mmHg, RV normal.   Follow up 11/23, NYHA III and volume mildly up. Torsemide increased to 60/40. Had recent cocaine relapse.  Follow up 1/24, NYHA III and volume mildly up. Torsemide increased to 60 bid, had recent cocaine relapse.  Today he returns for HF follow up. Overall feeling fine. Wears 3L oxygen with activity, compliant with CPAP at night. Feet are swelling.  Denies palpitations, abnormal bleeding, CP, dizziness, or PND/Orthopnea. Appetite ok. No fever or chills. Weight  at home 340 pounds. Taking all medications. Uses cocaine about once a month, trying to cut back. Attends virtual NA meetings.  ECG (personally reviewed): NSR 87 bpm  Labs (11/20): BNP 32, K 3.6, creatinine 1.09  Labs (12/20): K 3.3, creatinine 1.28 Labs (1/21): K 3.9, creatinine 1.4  Labs (11/05/19) : K 3.2 Creatinine 1.47  Labs (4/21) : K 3.5, creatinine 1.14  Labs (6/21): K 3.8, creatinine 1.09.  Labs (12/21): K 4.0, creatinine 1.17, Magnesium 1.6 Labs  (12/21): K 3.1, creatinine 2.54 Labs (1/22): K 3.1, creatinine 1.56 Labs (5/22): K 3.6, creatinine 1.89 Labs (1/23): K 3.9, creatinine 1.34 Labs (3/23): K 4.0, creatinine 1.29 => 1.77 Labs (5/23); K 4.3, creatinine 1.47, LDL 65, HDL 44, normal LFTs Labs (16/10): K 3.6, creatinine 1.5, LDL 49 Labs (11/23): K 3.7, creatinine 1.59 Labs (1/24): K 3.8, creatinine 1.73  Review of Systems: All systems reviewed and negative except as per HPI.   PMH: 1. Atrial fibrillation: Paroxysmal.  - DCCV (08/21/20): converted to SR 2. Pulmonary embolus: 5/20.  3. OHS/OSA: He is on home oxygen during the day and uses CPAP at night.  4. Morbid obesity.  5. Chronic diastolic CHF:  - RHC (7/20): mean RA 12, PA 40/25 mean 31, mean PCWP 23, CI 2.47, PVR 1.1 WU.  - Echo (10/20): EF 65-70%, mild LVH, normal RV size and systolic function.  - TEE (12/21): EF 60-65% - Cardiolite in 12/21: fixed apical defect is likely artifact, no ischemia.  - Echo (5/23): EF 60-65%, moderate LVH, mid-cavity LV gradient to 29 mmHg, RV normal.  6. Type 2 diabetes 7. HTN 8. Rectal bleeding: ?Hemorrhoidal.  9. COVID-19 infection 7/20.  10. ZIo Patch - 10/20/19 no arrhythmias 11. CKD: Stage 3 12. Depression 13. Cocaine abuse  Current Outpatient Medications  Medication Sig Dispense Refill   albuterol (PROVENTIL) (2.5 MG/3ML) 0.083% nebulizer solution Take 3 mLs (2.5 mg total) by nebulization every 4 (four) hours as needed for wheezing or shortness of breath. INHALE 1 VIAL BY NEBULIZATION EVERY 6 (SIX) HOURS AS NEEDED FOR WHEEZING OR SHORTNESS OF BREATH. Strength: (2.5 MG/3ML) 0.083% 150 mL 1   atorvastatin (LIPITOR) 40 MG tablet TAKE 1 TABLET (40 MG TOTAL) BY MOUTH AT BEDTIME. 90 tablet 3   Blood Glucose Monitoring Suppl (TRUE METRIX METER) w/Device KIT Use as instructed. Check blood glucose level by fingerstick twice per day. 1 kit 0   Blood Pressure Monitor DEVI Please provide patient with insurance approved blood pressure device  with L-XL cuff. BMI 55 1 each 0   COMBIVENT RESPIMAT 20-100 MCG/ACT AERS respimat INHALE 1 PUFF INTO THE LUNGS EVERY 6 (SIX) HOURS. (Patient taking differently: Inhale 1 puff into the lungs every 6 (six) hours as needed for shortness of breath.) 4 g 5   diltiazem (CARDIZEM CD) 360 MG 24 hr capsule TAKE 1 CAPSULE (360 MG TOTAL) BY MOUTH DAILY. (NOON) 90 capsule 3   empagliflozin (JARDIANCE) 10 MG TABS tablet Take 1 tablet (10 mg total) by mouth daily. NEEDS FOLLOW UP APPOINTMENT FOR MORE REFILLS 30 tablet 3   ferrous sulfate (FEROSUL) 325 (65 FE) MG tablet TAKE 1 TABLET (325 MG TOTAL) BY MOUTH DAILY AT NOON. 30 tablet 0   glucose blood (TRUE METRIX BLOOD GLUCOSE TEST) test strip Use as instructed. Check blood glucose level by fingerstick twice per day. 100 each 12   hydrALAZINE (APRESOLINE) 50 MG tablet TAKE 1 TABLET (50 MG TOTAL) BY MOUTH 3 (THREE) TIMES DAILY. (NOON,EVENING,BEDTIME) 180 tablet 0  isosorbide mononitrate (IMDUR) 30 MG 24 hr tablet Take 1 tablet (30 mg total) by mouth daily. 30 tablet 0   losartan (COZAAR) 50 MG tablet TAKE 1 TABLET (50 MG TOTAL) BY MOUTH DAILY. (NOON) 90 tablet 3   magnesium oxide (MAG-OX) 400 (240 Mg) MG tablet TAKE 1 TABLET (400 MG TOTAL) BY MOUTH 2 (TWO) TIMES DAILY (NOON+BEDTIME) 90 tablet 3   metFORMIN (GLUCOPHAGE) 500 MG tablet TAKE 1 TABLET BY MOUTH 2 (TWO) TIMES DAILY WITH A MEAL(NOON+BEDTIME) 180 tablet 0   Misc. Devices MISC Please provide patient with XL sized insurance approved compression stockings. ICD 10 R60.0 and I50.32 1 each 0   omeprazole (PRILOSEC) 20 MG capsule TAKE 1 CAPSULE 2 (TWO) TIMES DAILY BEFORE A MEAL. TAKE FIRST CAPSULE 30 MIN PRIOR TO EATING OR TAKING OTHER MEDICATIONS. (AM+BEDTIME) 180 capsule 1   OXYGEN Inhale 2-3 L into the lungs continuous.     potassium chloride (KLOR-CON) 10 MEQ tablet Take 4 tablets (40 mEq total) by mouth daily.     spironolactone (ALDACTONE) 50 MG tablet TAKE 1 TABLET (50 MG TOTAL) BY MOUTH DAILY. (NOON)  (Patient taking differently: Take 50 mg by mouth daily.) 30 tablet 11   tamsulosin (FLOMAX) 0.4 MG CAPS capsule Take 0.4 mg by mouth daily after supper.     torsemide (DEMADEX) 20 MG tablet Take 3 tablets (60 mg total) by mouth daily.     TRUEplus Lancets 28G MISC Use as instructed. Check blood glucose level by fingerstick twice per day. 200 each 3   XARELTO 20 MG TABS tablet TAKE 1 TABLET (20 MG TOTAL) BY MOUTH DAILY WITH SUPPER (BEDTIME) 90 tablet 3   No current facility-administered medications for this visit.   No Known Allergies  Social History   Socioeconomic History   Marital status: Single    Spouse name: Not on file   Number of children: Not on file   Years of education: Not on file   Highest education level: Not on file  Occupational History   Not on file  Tobacco Use   Smoking status: Former   Smokeless tobacco: Never   Tobacco comments:    smoked weed in the past  Vaping Use   Vaping status: Never Used  Substance and Sexual Activity   Alcohol use: Not Currently   Drug use: Yes    Types: Marijuana, Cocaine   Sexual activity: Not Currently  Other Topics Concern   Not on file  Social History Narrative   Not on file   Social Drivers of Health   Financial Resource Strain: High Risk (02/27/2023)   Overall Financial Resource Strain (CARDIA)    Difficulty of Paying Living Expenses: Hard  Food Insecurity: No Food Insecurity (05/26/2023)   Hunger Vital Sign    Worried About Running Out of Food in the Last Year: Never true    Ran Out of Food in the Last Year: Never true  Transportation Needs: No Transportation Needs (05/26/2023)   PRAPARE - Administrator, Civil Service (Medical): No    Lack of Transportation (Non-Medical): No  Physical Activity: Not on file  Stress: Not on file  Social Connections: Unknown (03/12/2023)   [facility], [facility]   Social Network    Social Network: Not on file  Intimate Partner Violence: Not At Risk  (05/26/2023)   Humiliation, Afraid, Rape, and Kick questionnaire    Fear of Current or Ex-Partner: No    Emotionally Abused: No    Physically  Abused: No    Sexually Abused: No   Family History  Problem Relation Age of Onset   Hypertension Mother    Diabetes Mother    There were no vitals taken for this visit.  Wt Readings from Last 3 Encounters:  08/11/23 (!) 155.5 kg (342 lb 12.8 oz)  06/09/23 (!) 142.4 kg (313 lb 14.4 oz)  05/27/23 (!) 142.6 kg (314 lb 6 oz)   PHYSICAL EXAM: General:  NAD. No resp difficulty, walked into clinic on oxygen HEENT: Normal Neck: Supple. No JVD. Carotids 2+ bilat; no bruits. No lymphadenopathy or thryomegaly appreciated. Cor: PMI nondisplaced. Regular rate & rhythm. No rubs, gallops or murmurs. Lungs: Clear, diminished in bases Abdomen: Soft, obese, nontender, nondistended. No hepatosplenomegaly. No bruits or masses. Good bowel sounds. Extremities: No cyanosis, clubbing, rash, trace BLE edema L>R with varicoscities Neuro: Alert & oriented x 3, cranial nerves grossly intact. Moves all 4 extremities w/o difficulty. Affect pleasant.  ASSESSMENT & PLAN: 1. Chronic diastolic CHF: Echo 5/23 showed EF 60-65%, moderate LVH, mid-cavity LV gradient to 29 mmHg, RV normal.  Suspect LVH is due to long-standing HTN. Stable NYHA class III symptoms, he is not volume overloaded by exam today, weight is stable. - Continue torsemide 60 mg bid + 80 KCL daily. BMET and BNP today. - Continue Jardiance 10 mg daily. No GU symptoms. - Continue spironolactone 50 mg daily.  - Continue losartan 50 mg daily. - Continue hydralazine 50 mg tid. - Cannot get Cardiomems as he has Medicaid.  - Update echo next visit. 2. Atrial fibrillation: Paroxysmal.  NSR on ECG today. - Continue current dose of diltiazem.  - Continue Xarelto. No bleeding issues. CBC today. 3. H/o PE: 5/20, continue Xarelto.  4. OHS/OSA: Uses O2 during the day and CPAP at night.  5. HTN: BP elevated in clinic  but has not had meds for today. - Continue current regimen. 6. DM: Per PCP. Most recent hgb A1c 6.3 7. Obesity: There is no height or weight on file to calculate BMI.  - He has diabetes, referred to PharmD to see if we can get him semaglutide. 8. Cocaine: Has cut back. Encouraged complete abstinence - Sees a Veterinary surgeon.   Follow up in 3-4 months with Dr. Shirlee Latch + echo.  Anderson Malta [facility] FNP-BC 09/01/2023

## 2023-09-01 NOTE — ED Notes (Signed)
RN reached out to phlebotomy to stick pt for VBG

## 2023-09-01 NOTE — ED Notes (Signed)
Pt provided bag lunch and beverage. 

## 2023-09-01 NOTE — ED Notes (Signed)
Pt currently asleep, chest rise and fall noted

## 2023-09-01 NOTE — ED Notes (Signed)
Patient is resting comfortably. 

## 2023-09-01 NOTE — ED Notes (Signed)
RN called pt name loudly and pt just grunted. RN sternum rub pt and he said stop. Dr. Rodena Medin entered room and started waking pt up. Pt woke up and answered EDP questions. Pt has a patch on his right deltoid which he states was placed by his probation officer. Pt states he is hungry but EDP said wait until pt is fully awake. RN stated she will return in thirty minutes to assess pt alertness. Pt adjusted self in bed and said ok.

## 2023-09-01 NOTE — ED Triage Notes (Signed)
Pt BIB EMS for afib rvr. HR was 200's. Pt was walking to his apartment and felt palpitations, SHOB, chest pain. EMS gave 5mg  of versed and cardioverted at 200Jules. Pt arrives in NSR.

## 2023-09-01 NOTE — ED Notes (Signed)
Pt sat up and stated he is hungry now. RN notified Dr. Rodena Medin

## 2023-09-04 ENCOUNTER — Telehealth (HOSPITAL_COMMUNITY): Payer: Self-pay | Admitting: Licensed Clinical Social Worker

## 2023-09-04 ENCOUNTER — Encounter (HOSPITAL_COMMUNITY): Payer: MEDICAID

## 2023-09-04 NOTE — Telephone Encounter (Signed)
H&V Care Navigation CSW Progress Note  Clinical Social Worker called patient to check in following ED visit- states he is still recovering from being shocked and is very sore which is why he missed his appt today with the [facility]- provided him with clinic number to reschedule.  Reminded patient of [facility] Thursday that I helped reschedule- provided appt details and address- patient will call transport today to arrange.   SDOH Screenings   Food Insecurity: No Food Insecurity (05/26/2023)  Housing: Low Risk  (05/26/2023)  Transportation Needs: No Transportation Needs (05/26/2023)  Utilities: Not At Risk (05/26/2023)  Depression (PHQ2-9): Low Risk  (08/11/2023)  Financial Resource Strain: High Risk (02/27/2023)  Social Connections: Unknown (03/12/2023)   Received from [facility], [facility]  Tobacco Use: Medium Risk (09/01/2023)   [name], LCSW Clinical Social Worker [facility] Desk#: [phone] Cell#: [phone]

## 2023-09-06 ENCOUNTER — Telehealth (HOSPITAL_COMMUNITY): Payer: Self-pay | Admitting: Licensed Clinical Social Worker

## 2023-09-06 NOTE — Telephone Encounter (Signed)
H&V Care Navigation CSW Progress Note  Clinical Social Worker received call from pt requesting help with a letter stating his medical concerns and how he would benefit from a 1st floor apartment.  Able to provide letter to patient's management company.   SDOH Screenings   Food Insecurity: No Food Insecurity (05/26/2023)  Housing: Low Risk  (05/26/2023)  Transportation Needs: No Transportation Needs (05/26/2023)  Utilities: Not At Risk (05/26/2023)  Depression (PHQ2-9): Low Risk  (08/11/2023)  Financial Resource Strain: High Risk (02/27/2023)  Social Connections: Unknown (03/12/2023)   Received from [facility], [facility]  Tobacco Use: Medium Risk (09/01/2023)    [name], LCSW Clinical Social Worker [facility] Desk#: [phone] Cell#: [phone]

## 2023-09-07 ENCOUNTER — Ambulatory Visit (INDEPENDENT_AMBULATORY_CARE_PROVIDER_SITE_OTHER): Payer: MEDICAID | Admitting: Podiatry

## 2023-09-07 ENCOUNTER — Encounter: Payer: Self-pay | Admitting: Podiatry

## 2023-09-07 DIAGNOSIS — M2042 Other hammer toe(s) (acquired), left foot: Secondary | ICD-10-CM | POA: Diagnosis not present

## 2023-09-07 DIAGNOSIS — M2041 Other hammer toe(s) (acquired), right foot: Secondary | ICD-10-CM | POA: Diagnosis not present

## 2023-09-07 DIAGNOSIS — E1149 Type 2 diabetes mellitus with other diabetic neurological complication: Secondary | ICD-10-CM

## 2023-09-07 DIAGNOSIS — M19072 Primary osteoarthritis, left ankle and foot: Secondary | ICD-10-CM

## 2023-09-07 DIAGNOSIS — M19071 Primary osteoarthritis, right ankle and foot: Secondary | ICD-10-CM

## 2023-09-08 ENCOUNTER — Encounter: Payer: Self-pay | Admitting: Podiatry

## 2023-09-08 NOTE — Progress Notes (Signed)
This patient presents to the office with chief complaint diabetic feet.  This patient  says there  is  pain and numbness in his fee and ankles.feet.    Patient has no history of infection or drainage from both feet.  Patient idesires diabetic hoes.  He is not interested in nail care which was performed in the last week. This patient presents  to the office for a foot evaluation due to history of  diabetes and acquiring diabetic shoes.  General Appearance  Alert, conversant and in no acute stress.  Vascular  Dorsalis pedis and posterior tibial  pulses are palpable  bilaterally.  Capillary return is within normal limits  bilaterally. Temperature is within normal limits  bilaterally.  Neurologic  Senn-Weinstein monofilament wire test diminished/absent   bilaterally. Muscle power within normal limits bilaterally.  Nails Normotropic nails.   bilaterally. No evidence of bacterial infection or drainage bilaterally.  Orthopedic  No limitations of motion of motion feet .  No crepitus or effusions noted.  No bony pathology or digital deformities noted. Midfoot arthritis.  Skin  normotropic skin with no porokeratosis noted bilaterally.  No signs of infections or ulcers noted.     Onychomycosis  Diabetes with no foot complications   A diabetic foot exam was performed and there is no evidence of any vascular or neurologic pathology.   Patient desires diabetic shoes and qualifies for shoes.  He has medicaid so he will be contacted by pedorthist about where he can acquire his shoes.   RTC 3 months.   Helane Gunther DPM

## 2023-09-11 ENCOUNTER — Encounter (HOSPITAL_COMMUNITY): Payer: Self-pay

## 2023-09-11 ENCOUNTER — Telehealth (HOSPITAL_COMMUNITY): Payer: Self-pay | Admitting: Licensed Clinical Social Worker

## 2023-09-11 ENCOUNTER — Ambulatory Visit (HOSPITAL_COMMUNITY)
Admission: RE | Admit: 2023-09-11 | Discharge: 2023-09-11 | Disposition: A | Discharge status: Home / Self Care | Payer: MEDICAID | Source: Ambulatory Visit | Attending: Internal Medicine

## 2023-09-11 VITALS — BP 155/80 | HR 100 | Ht 71.0 in | Wt 338.2 lb

## 2023-09-11 DIAGNOSIS — Z6841 Body Mass Index (BMI) 40.0 and over, adult: Secondary | ICD-10-CM | POA: Insufficient documentation

## 2023-09-11 DIAGNOSIS — E1122 Type 2 diabetes mellitus with diabetic chronic kidney disease: Secondary | ICD-10-CM | POA: Insufficient documentation

## 2023-09-11 DIAGNOSIS — F1910 Other psychoactive substance abuse, uncomplicated: Secondary | ICD-10-CM

## 2023-09-11 DIAGNOSIS — N183 Chronic kidney disease, stage 3 unspecified: Secondary | ICD-10-CM | POA: Diagnosis not present

## 2023-09-11 DIAGNOSIS — Z7984 Long term (current) use of oral hypoglycemic drugs: Secondary | ICD-10-CM | POA: Insufficient documentation

## 2023-09-11 DIAGNOSIS — Z79899 Other long term (current) drug therapy: Secondary | ICD-10-CM | POA: Insufficient documentation

## 2023-09-11 DIAGNOSIS — I491 Atrial premature depolarization: Secondary | ICD-10-CM | POA: Insufficient documentation

## 2023-09-11 DIAGNOSIS — Z5982 Transportation insecurity: Secondary | ICD-10-CM | POA: Insufficient documentation

## 2023-09-11 DIAGNOSIS — Z9981 Dependence on supplemental oxygen: Secondary | ICD-10-CM | POA: Diagnosis not present

## 2023-09-11 DIAGNOSIS — I2699 Other pulmonary embolism without acute cor pulmonale: Secondary | ICD-10-CM | POA: Diagnosis not present

## 2023-09-11 DIAGNOSIS — I5032 Chronic diastolic (congestive) heart failure: Secondary | ICD-10-CM | POA: Diagnosis present

## 2023-09-11 DIAGNOSIS — R600 Localized edema: Secondary | ICD-10-CM | POA: Insufficient documentation

## 2023-09-11 DIAGNOSIS — I480 Paroxysmal atrial fibrillation: Secondary | ICD-10-CM | POA: Diagnosis not present

## 2023-09-11 DIAGNOSIS — I10 Essential (primary) hypertension: Secondary | ICD-10-CM

## 2023-09-11 DIAGNOSIS — Z87891 Personal history of nicotine dependence: Secondary | ICD-10-CM | POA: Diagnosis not present

## 2023-09-11 DIAGNOSIS — G4733 Obstructive sleep apnea (adult) (pediatric): Secondary | ICD-10-CM | POA: Diagnosis not present

## 2023-09-11 DIAGNOSIS — I130 Hypertensive heart and chronic kidney disease with heart failure and stage 1 through stage 4 chronic kidney disease, or unspecified chronic kidney disease: Secondary | ICD-10-CM | POA: Diagnosis not present

## 2023-09-11 DIAGNOSIS — R7989 Other specified abnormal findings of blood chemistry: Secondary | ICD-10-CM | POA: Insufficient documentation

## 2023-09-11 DIAGNOSIS — Z7901 Long term (current) use of anticoagulants: Secondary | ICD-10-CM | POA: Diagnosis not present

## 2023-09-11 DIAGNOSIS — Z86711 Personal history of pulmonary embolism: Secondary | ICD-10-CM | POA: Insufficient documentation

## 2023-09-11 DIAGNOSIS — E118 Type 2 diabetes mellitus with unspecified complications: Secondary | ICD-10-CM

## 2023-09-11 DIAGNOSIS — K219 Gastro-esophageal reflux disease without esophagitis: Secondary | ICD-10-CM | POA: Insufficient documentation

## 2023-09-11 LAB — BASIC METABOLIC PANEL
Anion gap: 12 (ref 5–15)
BUN: 43 mg/dL — ABNORMAL HIGH (ref 6–20)
CO2: 28 mmol/L (ref 22–32)
Calcium: 10.1 mg/dL (ref 8.9–10.3)
Chloride: 94 mmol/L — ABNORMAL LOW (ref 98–111)
Creatinine, Ser: 2.53 mg/dL — ABNORMAL HIGH (ref 0.61–1.24)
GFR, Estimated: 29 mL/min — ABNORMAL LOW (ref >60)
Glucose, Bld: 102 mg/dL — ABNORMAL HIGH (ref 70–99)
Potassium: 4.7 mmol/L (ref 3.5–5.1)
Sodium: 134 mmol/L — ABNORMAL LOW (ref 135–145)

## 2023-09-11 LAB — CBC
HCT: 50.3 % (ref 39.0–52.0)
Hemoglobin: 15.8 g/dL (ref 13.0–17.0)
MCH: 24.8 pg — ABNORMAL LOW (ref 26.0–34.0)
MCHC: 31.4 g/dL (ref 30.0–36.0)
MCV: 78.8 fL — ABNORMAL LOW (ref 80.0–100.0)
Platelets: 399 10*3/uL (ref 150–400)
RBC: 6.38 MIL/uL — ABNORMAL HIGH (ref 4.22–5.81)
RDW: 14.9 % (ref 11.5–15.5)
WBC: 6.7 10*3/uL (ref 4.0–10.5)
nRBC: 0 % (ref 0.0–0.2)

## 2023-09-11 LAB — MAGNESIUM: Magnesium: 1.9 mg/dL (ref 1.7–2.4)

## 2023-09-11 MED ORDER — POTASSIUM CHLORIDE CRYS ER 20 MEQ PO TBCR: EXTENDED_RELEASE_TABLET | ORAL | 6 refills | Status: AC

## 2023-09-11 MED ORDER — TORSEMIDE 20 MG PO TABS: 80.0000 mg | ORAL_TABLET | Freq: Two times a day (BID) | ORAL | 6 refills | Status: DC

## 2023-09-11 NOTE — Addendum Note (Signed)
Encounter addended by: Demetrius Charity, RN on: 09/11/2023 2:52 PM  Actions taken: Order list changed, Diagnosis association updated

## 2023-09-11 NOTE — Addendum Note (Signed)
Encounter addended by: [name], LCSW on: 09/11/2023 12:57 PM  Actions taken: Clinical Note Signed

## 2023-09-11 NOTE — Progress Notes (Addendum)
Advanced [facility] Clinic Note   PCP: Claiborne Rigg, NP Primary Cardiologist: Dietrich Pates, MD  HF Cardiologist: Dr. Shirlee Latch   HPI: Benjamin [name] is a 57 y.o. with hx of diastolic CHF, HTN, DM, Atrial fib, PE in April 2020 (on Xarelto), OSA, GERD, chronic elevation of troponin.    Admitted 01/21/2019 with increased dyspnea and had PE. He was anticoagulated.    Patient re-admitted 5/20 for syncope/cough/dyspnea, COVID+, CT positive for bilateral GGOs, admitted to [facility] and discharged after 6 days, on O2.  Rec'd Cards f/u.   Patient re-admitted 02/20/19 for bright red blood per rectum, eval'ed by GI, had EGD/colon, dx'd with hemorrhoidal bleeding, discharged after 9 days on O2. He was again Covid + .   Readmitted 03/11/19 with increased shortness of breath. He did not have home oxygen. He was discharged to home the next day.    He presented to [facility] ED 04/11/19 with lower extremity edema. COVID negative. Diuresed with lasix drip and transitioned to torsemide 40 mg twice daily. Had RHC with mild volume overload and preserved cardiac output. Discharge weight was 407.9 pounds.    He was readmitted with symptomatic anemia 05/28/19 with hgb 7.1. Received 1UPRBCs. GI consulted. He started on anusol and continued on stool softner.    Readmitted 07/16/19 with a/c diastolic CHF and treated w/ IV Lasix. Had 2 day hospital stay and was discharged on 10/29. Echo showed normal LVEF 65-70%, RV interpreted as normal but suspect significant RV dysfunction.  After diuresis w/ IV Lasix, he was placed back on home diuretic regimen, torsemide 100 mg bid. Lisinopril 5 mg was also added to regimen. AHF team was not consulted that admit.   He was seen back in clinic 07/30/19. Felt poorly. SOB at rest and worse w/ exertion. Also w/ orthopnea/PND. Significant wt gain of 20 lb up to 428 lb (dry wt ~407 lb), w/ marked abdominal distention and LEE and poor response to IV diuretics.  Once diuresed transitioned to torsemide 100  mg twice a day. D/C 408 pounds.   Zio patch on 10/20/19-No arrhythmia.   Re-admitted 08/19/20-08/24/20 for  A fib RVR. A. fib RVR in the setting of noncompliance ethanol and cocaine. Cardizem started and labetalol continued.  12/3 TEE (EF 60-65%) with DCCV successfully converted to SR. Diuresed with IV lasix and had AKI in setting of over diuresis. SCr 1.59 on admit, bumped to 2.5. Diuretics held + IVF hydration.   Admitted 12/24/20 with hypertensive emergency and respiratory failure. .   Admitted 4/22 with A Fib RVR & AMS. Diltiazem increased to 360 mg daily. Chemically converted to NSR.  + Cocaine.   Seen in ED 11/22 for COPD exacerbation.  Seen 11/02/21 and had been off all meds x 1 week, volume overloaded. Meds restarted and instructed to take metolazone 2.5 x 2 days w/ extra 40 kcl, however labs showed elevated BUN/SCr and left message to take metolazone x 1 day.  Re-enrolled in paramedicine.  Echo 5/23 EF 60-65%, moderate LVH, mid-cavity LV gradient to 29 mmHg, RV normal.   Follow up 11/23, NYHA III and volume mildly up. Torsemide increased to 60/40. Had recent cocaine relapse.  Follow up 1/24, NYHA III and volume mildly up. Torsemide increased to 60 bid, had recent cocaine relapse.  Echo 9/24: EF 70-75%, LV with no RWMA, severe concentric LVH, GIDD, RV normal  Today he returns for AHF follow up, had recent ED visit 12/24 with a fib RVR. Required DCCV by EMS, convered to NSR. Discharged  same day.  Overall feeling ok. Denies palpitations, CP, dizziness, or PND/Orthopnea. Has BLE edema. SOB with activity. Appetite is good. No fever or chills. Weight at home 330-340 pounds. Taking all medications, gets bubble packs.  Wears 3L oxygen with activity, compliant with CPAP at night.  No fever or chills. Stopped virtual NA meetings over the last 2 months. Has been going to the strip clubs and doing cocaine (once every 2 weeks but "if he sees it he does it"), marijuana and drinking.   ECG (Personally  reviewed)  NSR 98 bpm with PACs  Review of Systems: All systems reviewed and negative except as per HPI.   PMH: 1. Atrial fibrillation: Paroxysmal.  - DCCV (08/21/20): converted to SR 2. Pulmonary embolus: 5/20.  3. OHS/OSA: He is on home oxygen during the day and uses CPAP at night.  4. Morbid obesity.  5. Chronic diastolic CHF:  - RHC (7/20): mean RA 12, PA 40/25 mean 31, mean PCWP 23, CI 2.47, PVR 1.1 WU.  - Echo (10/20): EF 65-70%, mild LVH, normal RV size and systolic function.  - TEE (12/21): EF 60-65% - Cardiolite in 12/21: fixed apical defect is likely artifact, no ischemia.  - Echo (5/23): EF 60-65%, moderate LVH, mid-cavity LV gradient to 29 mmHg, RV normal.  6. Type 2 diabetes 7. HTN 8. Rectal bleeding: ?Hemorrhoidal.  9. COVID-19 infection 7/20.  10. ZIo Patch - 10/20/19 no arrhythmias 11. CKD: Stage 3 12. Depression 13. Cocaine abuse  Current Outpatient Medications  Medication Sig Dispense Refill   albuterol (PROVENTIL) (2.5 MG/3ML) 0.083% nebulizer solution Take 3 mLs (2.5 mg total) by nebulization every 4 (four) hours as needed for wheezing or shortness of breath. INHALE 1 VIAL BY NEBULIZATION EVERY 6 (SIX) HOURS AS NEEDED FOR WHEEZING OR SHORTNESS OF BREATH. Strength: (2.5 MG/3ML) 0.083% 150 mL 1   atorvastatin (LIPITOR) 40 MG tablet TAKE 1 TABLET (40 MG TOTAL) BY MOUTH AT BEDTIME. 90 tablet 3   Blood Glucose Monitoring Suppl (TRUE METRIX METER) w/Device KIT Use as instructed. Check blood glucose level by fingerstick twice per day. 1 kit 0   Blood Pressure Monitor DEVI Please provide patient with insurance approved blood pressure device with L-XL cuff. BMI 55 1 each 0   COMBIVENT RESPIMAT 20-100 MCG/ACT AERS respimat INHALE 1 PUFF INTO THE LUNGS EVERY 6 (SIX) HOURS. (Patient taking differently: Inhale 1 puff into the lungs every 6 (six) hours as needed for shortness of breath.) 4 g 5   diltiazem (CARDIZEM CD) 360 MG 24 hr capsule TAKE 1 CAPSULE (360 MG TOTAL) BY MOUTH  DAILY. (NOON) 90 capsule 3   empagliflozin (JARDIANCE) 10 MG TABS tablet Take 1 tablet (10 mg total) by mouth daily. NEEDS FOLLOW UP APPOINTMENT FOR MORE REFILLS 30 tablet 3   ferrous sulfate (FEROSUL) 325 (65 FE) MG tablet TAKE 1 TABLET (325 MG TOTAL) BY MOUTH DAILY AT NOON. 30 tablet 0   glucose blood (TRUE METRIX BLOOD GLUCOSE TEST) test strip Use as instructed. Check blood glucose level by fingerstick twice per day. 100 each 12   hydrALAZINE (APRESOLINE) 50 MG tablet TAKE 1 TABLET (50 MG TOTAL) BY MOUTH 3 (THREE) TIMES DAILY. (NOON,EVENING,BEDTIME) 180 tablet 0   losartan (COZAAR) 50 MG tablet TAKE 1 TABLET (50 MG TOTAL) BY MOUTH DAILY. (NOON) 90 tablet 3   magnesium oxide (MAG-OX) 400 (240 Mg) MG tablet TAKE 1 TABLET (400 MG TOTAL) BY MOUTH 2 (TWO) TIMES DAILY (NOON+BEDTIME) 90 tablet 3   metFORMIN (GLUCOPHAGE) 500  MG tablet TAKE 1 TABLET BY MOUTH 2 (TWO) TIMES DAILY WITH A MEAL(NOON+BEDTIME) 180 tablet 0   Misc. Devices MISC Please provide patient with XL sized insurance approved compression stockings. ICD 10 R60.0 and I50.32 1 each 0   omeprazole (PRILOSEC) 20 MG capsule TAKE 1 CAPSULE 2 (TWO) TIMES DAILY BEFORE A MEAL. TAKE FIRST CAPSULE 30 MIN PRIOR TO EATING OR TAKING OTHER MEDICATIONS. (AM+BEDTIME) 180 capsule 1   OXYGEN Inhale 2-3 L into the lungs continuous.     potassium chloride (KLOR-CON) 10 MEQ tablet Take 4 tablets (40 mEq total) by mouth daily.     spironolactone (ALDACTONE) 50 MG tablet TAKE 1 TABLET (50 MG TOTAL) BY MOUTH DAILY. (NOON) (Patient taking differently: Take 50 mg by mouth daily.) 30 tablet 11   tamsulosin (FLOMAX) 0.4 MG CAPS capsule Take 0.4 mg by mouth daily after supper.     torsemide (DEMADEX) 20 MG tablet Take 3 tablets (60 mg total) by mouth daily.     TRUEplus Lancets 28G MISC Use as instructed. Check blood glucose level by fingerstick twice per day. 200 each 3   XARELTO 20 MG TABS tablet TAKE 1 TABLET (20 MG TOTAL) BY MOUTH DAILY WITH SUPPER (BEDTIME) 90  tablet 3   isosorbide mononitrate (IMDUR) 30 MG 24 hr tablet Take 1 tablet (30 mg total) by mouth daily. 30 tablet 0   No current facility-administered medications for this encounter.   No Known Allergies  Social History   Socioeconomic History   Marital status: Single    Spouse name: Not on file   Number of children: Not on file   Years of education: Not on file   Highest education level: Not on file  Occupational History   Not on file  Tobacco Use   Smoking status: Former   Smokeless tobacco: Never   Tobacco comments:    smoked weed in the past  Vaping Use   Vaping status: Never Used  Substance and Sexual Activity   Alcohol use: Not Currently   Drug use: Yes    Types: Marijuana, Cocaine   Sexual activity: Not Currently  Other Topics Concern   Not on file  Social History Narrative   Not on file   Social Drivers of Health   Financial Resource Strain: High Risk (02/27/2023)   Overall Financial Resource Strain (CARDIA)    Difficulty of Paying Living Expenses: Hard  Food Insecurity: No Food Insecurity (05/26/2023)   Hunger Vital Sign    Worried About Running Out of Food in the Last Year: Never true    Ran Out of Food in the Last Year: Never true  Transportation Needs: Unmet Transportation Needs (09/11/2023)   PRAPARE - Administrator, Civil Service (Medical): Yes    Lack of Transportation (Non-Medical): No  Physical Activity: Not on file  Stress: Not on file  Social Connections: Unknown (03/12/2023)   Received from [facility], [facility]   Social Network    Social Network: Not on file  Intimate Partner Violence: Not At Risk (05/26/2023)   Humiliation, Afraid, Rape, and Kick questionnaire    Fear of Current or Ex-Partner: No    Emotionally Abused: No    Physically Abused: No    Sexually Abused: No   Family History  Problem Relation Age of Onset   Hypertension Mother    Diabetes Mother    BP (!) 155/80   Pulse 100   Ht 5\' 11"  (1.803 m)   Wt  (!) 153.4  kg (338 lb 3.2 oz)   SpO2 93%   BMI 47.17 kg/m   Wt Readings from Last 3 Encounters:  09/11/23 (!) 153.4 kg (338 lb 3.2 oz)  08/11/23 (!) 155.5 kg (342 lb 12.8 oz)  06/09/23 (!) 142.4 kg (313 lb 14.4 oz)   PHYSICAL EXAM: General:  well appearing.  No respiratory difficulty. Walked into clinic HEENT: normal Neck: supple. JVD difficult to see (thick neck). Carotids 2+ bilat; no bruits. No lymphadenopathy or thyromegaly appreciated. Cor: PMI nondisplaced. Regular rate & rhythm. No rubs, gallops or murmurs. Lungs: diminished Abdomen: obese, soft, nontender, nondistended. No hepatosplenomegaly. No bruits or masses. Good bowel sounds. Extremities: no cyanosis, clubbing, rash, non-pitting BLE edema  Neuro: alert & oriented x 3, cranial nerves grossly intact. moves all 4 extremities w/o difficulty. Affect pleasant.   ASSESSMENT & PLAN: 1. Chronic diastolic CHF: Echo 5/23 showed EF 60-65%, moderate LVH, mid-cavity LV gradient to 29 mmHg, RV normal.  Suspect LVH is due to long-standing HTN.  - Echo 9/24: EF 70-75%, LV with no RWMA, severe concentric LVH, GIDD, RV normal - Stable NYHA class IIIb symptoms, mildly volume overloaded.  - Increase torsemide 80 mg bid + increase KCL to 100/80 KCL daily. BMET and BNP today. Repeat BMET 1 week.  - Continue Jardiance 10 mg daily. No GU symptoms. - Continue spironolactone 50 mg daily.  - Continue losartan 50 mg daily. - Continue hydralazine 50 mg tid. - Cannot get Cardiomems as he has Medicaid.  2. Atrial fibrillation: Paroxysmal.   - NSR on EKG today - Recent DCCV by EMS 09/01/23 for a fib RVR rate 200s - Continue current dose of diltiazem.  - Continue Xarelto. No bleeding issues. CBC today. 3. H/o PE: 5/20, continue Xarelto.  4. OHS/OSA: Uses O2 during the day and CPAP at night.  5. HTN: BP elevated in clinic, took meds 10 minutes prior to coming to clinic - Continue current regimen. 6. DM: Per PCP. Most recent hgb A1c 6.7 7. Obesity:  Body mass index is 47.17 kg/m.  - He has diabetes, referred to PharmD to see if we can get him semaglutide.  8. Cocaine: still using, cessation discussed  Follow up in 2 months with APP. RN to call pharmacy to adjust bubble packs. He is asking about being re-enrolled in paramedicine program, discussed with [facility] and unfortunately not [facility] so at this time he is unable to re-enroll.   Alen Bleacher AGACNP-BC  09/11/2023

## 2023-09-11 NOTE — Progress Notes (Signed)
H&V Care Navigation CSW Progress Note  Clinical Social Worker informed pt needing to see PCP asap for medical concerns.  Reached out to [facility] with CHW to discuss- no clinic visits available till end of January.  RNCM able to assist in getting pt transportation to mobile clinic tomorrow- will get ride at 9:30am- patient aware.  Patient is participating in a Managed Medicaid Plan:  Yes  SDOH Screenings   Food Insecurity: No Food Insecurity (05/26/2023)  Housing: Low Risk  (05/26/2023)  Transportation Needs: Unmet Transportation Needs (09/11/2023)  Utilities: Not At Risk (05/26/2023)  Depression (PHQ2-9): Low Risk  (08/11/2023)  Financial Resource Strain: High Risk (02/27/2023)  Social Connections: Unknown (03/12/2023)   Received from [facility], [facility]  Tobacco Use: Medium Risk (09/11/2023)    [name], LCSW Clinical Social Worker [facility] Desk#: [phone] Cell#: [phone]

## 2023-09-11 NOTE — Patient Instructions (Addendum)
Thank you for coming in today  If you had labs drawn today, any labs that are abnormal the clinic will call you No news is good news  You have been referred to pharmacy for [facility] , their office will call you for further appointment details   Medications: Increase Torsemide to 80 mg twice daily Increase Potassium to 100 meq every morning and 80 meq in the evening   Follow up appointments: Your physician recommends that you schedule a follow-up appointment in:  1 week for BMET  Your physician recommends that you schedule a follow-up appointment in:  2 months in clinic   Do the following things EVERYDAY: Weigh yourself in the morning before breakfast. Write it down and keep it in a log. Take your medicines as prescribed Eat low salt foods--Limit salt (sodium) to 2000 mg per day.  Stay as active as you can everyday Limit all fluids for the day to less than 2 liters   At the [facility], you and your health needs are our priority. As part of our continuing mission to provide you with exceptional heart care, we have created designated Provider Care Teams. These Care Teams include your primary Cardiologist (physician) and Advanced Practice Providers (APPs- Physician Assistants and Nurse Practitioners) who all work together to provide you with the care you need, when you need it.   You may see any of the following providers on your designated Care Team at your next follow up: Dr Arvilla Meres Dr Marca Ancona Dr. Marcos Eke, NP Robbie Lis, [place] [facility] [place], [place] Brynda Peon, NP Karle Plumber, PharmD   Please be sure to bring in all your medications bottles to every appointment.    Thank you for choosing [facility]  If you have any questions or concerns before your next appointment please send [place] a message through [place] or call our office at [phone].    TO LEAVE A  MESSAGE FOR THE NURSE SELECT OPTION 2, PLEASE LEAVE A MESSAGE INCLUDING: YOUR NAME DATE OF BIRTH CALL BACK NUMBER REASON FOR CALL**this is important as we prioritize the call backs  YOU WILL RECEIVE A CALL BACK THE SAME DAY AS LONG AS YOU CALL BEFORE 4:00 PM

## 2023-09-11 NOTE — Telephone Encounter (Signed)
H&V Care Navigation CSW Progress Note  Clinical Social Worker received call from pt stating that medicaid transport was going to pick him up at later time so he would miss appt- CSW arranged [employer] to bring to appt.  Patient is participating in a Managed Medicaid Plan:  Yes  SDOH Screenings   Food Insecurity: No Food Insecurity (05/26/2023)  Housing: Low Risk  (05/26/2023)  Transportation Needs: Unmet Transportation Needs (09/11/2023)  Utilities: Not At Risk (05/26/2023)  Depression (PHQ2-9): Low Risk  (08/11/2023)  Financial Resource Strain: High Risk (02/27/2023)  Social Connections: Unknown (03/12/2023)   Received from [facility], [facility]  Tobacco Use: Medium Risk (09/08/2023)

## 2023-09-12 ENCOUNTER — Ambulatory Visit: Payer: MEDICAID | Admitting: Physician Assistant

## 2023-09-12 ENCOUNTER — Encounter: Payer: Self-pay | Admitting: Physician Assistant

## 2023-09-12 VITALS — BP 145/98 | HR 98 | Ht 71.0 in | Wt 334.0 lb

## 2023-09-12 DIAGNOSIS — Z6841 Body Mass Index (BMI) 40.0 and over, adult: Secondary | ICD-10-CM

## 2023-09-12 DIAGNOSIS — E66813 Obesity, class 3: Secondary | ICD-10-CM | POA: Diagnosis not present

## 2023-09-12 DIAGNOSIS — I509 Heart failure, unspecified: Secondary | ICD-10-CM

## 2023-09-12 DIAGNOSIS — E669 Obesity, unspecified: Secondary | ICD-10-CM

## 2023-09-12 DIAGNOSIS — R339 Retention of urine, unspecified: Secondary | ICD-10-CM | POA: Diagnosis not present

## 2023-09-12 NOTE — Patient Instructions (Signed)
You are going to be taking 80 mg or 4 tablets of the torsemide twice daily.  You are going to take 5 tablets of the potassium in the morning and 4 tablets of the potassium at night.  You can borrow the extra medications out of the end of your pill packs, increased dosing was sent to your pharmacy so they should be sending you increased number of pills.  I encourage you to consider being seen in the emergency department if you are not having any success the next 24 hours.  Roney Jaffe, PA-C Physician Assistant [facility] [email or web address]  Acute Urinary Retention, Male  Acute urinary retention is a condition in which a person is unable to pass urine or can only pass a little urine. This condition can happen suddenly and last for a short time. If left untreated, it can become long-term (chronic) and result in kidney damage or other serious complications. What are the causes? This condition may be caused by: Obstruction or narrowing of the tube that drains the bladder (urethra). This may be caused by surgery, problems with nearby organs, or injury to the bladder or urethra. Problems with the nerves in the bladder. Tumors in the area of the pelvis, bladder, or urethra. Certain medicines. Bladder or urinary tract infection. Constipation. What increases the risk? This condition is more likely to develop in older men. As men age, their prostate may become larger and may start to press or squeeze on the bladder or the urethra. Other chronic health conditions can increase the risk of acute urinary retention. These include: Diseases such as multiple sclerosis. Spinal cord injuries. Diabetes. Degenerative cognitive conditions, such as delirium or dementia. Psychological conditions. A man may hold his urine due to trauma or because he does not want to use the bathroom. What are the signs or symptoms? Symptoms of this condition  include: Trouble urinating. Pain in the lower abdomen. How is this diagnosed? This condition is diagnosed based on a physical exam and your medical history. You may also have other tests, including: An ultrasound of the bladder or kidneys or both. Blood tests. A urine analysis. Additional tests may be needed, such as a CT scan, MRI, and kidney or bladder function tests. How is this treated? Treatment for this condition may include: Medicines. Placing a thin, sterile tube (catheter) into the bladder to drain urine out of the body. This is called an indwelling urinary catheter. After it is inserted, the catheter is held in place with a small balloon that is filled with sterile water. Urine drains from the catheter into a collection bag outside of the body. Behavioral therapy. Treatment for other conditions. If needed, you may be treated in the hospital for kidney function problems or to manage other complications. Follow these instructions at home: Medicines Take over-the-counter and prescription medicines only as told by your health care provider. Avoid certain medicines, such as decongestants, antihistamines, and some prescription medicines. Do not take any medicine unless your health care provider approves. If you were prescribed an antibiotic medicine, take it as told by your health care provider. Do not stop using the antibiotic even if you start to feel better. General instructions Do not use any products that contain nicotine or tobacco. These products include cigarettes, chewing tobacco, and vaping devices, such as e-cigarettes. If you need help quitting, ask your health care provider. Drink enough fluid to keep your urine pale yellow. If you have an indwelling urinary catheter, follow the instructions from  your health care provider. Monitor any changes in your symptoms. Tell your health care provider about any changes. If instructed, monitor your blood pressure at home. Report changes  as told by your health care provider. Keep all follow-up visits. This is important. Contact a health care provider if: You have uncomfortable bladder contractions that you cannot control (spasms). You leak urine with the spasms. Get help right away if: You have chills or a fever. You have blood in your urine. You have a catheter and the following happens: Your catheter stops draining urine. Your catheter falls out. Summary Acute urinary retention is a condition in which a person is unable to pass urine or can only pass a little urine. If left untreated, this condition can result in kidney damage or other serious complications. An enlarged prostate may cause this condition. As men age, their prostate gland may become larger and may press or squeeze on the bladder or the urethra. Treatment for this condition may include medicines and placement of an indwelling urinary catheter. Monitor any changes in your symptoms. Tell your health care provider about any changes. This information is not intended to replace advice given to you by your health care provider. Make sure you discuss any questions you have with your health care provider. Document Revised: 05/27/2020 Document Reviewed: 05/27/2020 Elsevier Patient Education  2024 [employer].

## 2023-09-12 NOTE — Progress Notes (Signed)
Established Patient Office Visit  Subjective   Patient ID: Benjamin [name], male    DOB: Jan 29, 1966  Age: 57 y.o. MRN: [id number]  Chief Complaint  Patient presents with   Urinary Retention   Discussed the use of AI scribe software for clinical note transcription with the patient, who gave verbal consent to proceed.  History of Present Illness        The patient, with a history of heart failure, hypertension, and diabetes, presents with difficulty urinating and significant swelling in the scrotum, which he describes as "enormous." He reports a similar episode three years ago that required hospitalization and was managed with diuretics.   Yesterday the patient's torsemide was increased to 80mg  twice daily and potassium to 100mg  in the morning and 80mg  in the evening. However, due to his medications being pre-packaged in bubble packs, he is unsure how to adjust his dosages and did not start the increase, has not taken any medications yet today.   He also reports an increase in his fluid intake, drinking  four liters of water yesterday in an attempt to improve his urination.  The patient also reports a weight gain of five to six pounds in the past day   The patient uses a portable oxygen concentrator due to his heart failure. He did not bring it to the appointment, but reports usually needing two liters of oxygen.     Past Medical History:  Diagnosis Date   A-fib (HCC)    Anemia 05/29/2019   CHF (congestive heart failure) (HCC)    Chronic respiratory failure (HCC)    Diabetes mellitus without complication (HCC)    Dyspnea    Elevated troponin 02/06/2019   Hypertension    Obesity    Pulmonary embolism (HCC)    Rectal bleeding 05/29/2019   Sleep apnea    Social History   Socioeconomic History   Marital status: Single    Spouse name: Not on file   Number of children: Not on file   Years of education: Not on file   Highest education level: Not on file  Occupational  History   Not on file  Tobacco Use   Smoking status: Former   Smokeless tobacco: Never   Tobacco comments:    smoked weed in the past  Vaping Use   Vaping status: Never Used  Substance and Sexual Activity   Alcohol use: Not Currently   Drug use: Yes    Types: Marijuana, Cocaine   Sexual activity: Not Currently  Other Topics Concern   Not on file  Social History Narrative   Not on file   Social Drivers of Health   Financial Resource Strain: High Risk (02/27/2023)   Overall Financial Resource Strain (CARDIA)    Difficulty of Paying Living Expenses: Hard  Food Insecurity: No Food Insecurity (05/26/2023)   Hunger Vital Sign    Worried About Running Out of Food in the Last Year: Never true    Ran Out of Food in the Last Year: Never true  Transportation Needs: Unmet Transportation Needs (09/11/2023)   PRAPARE - Administrator, Civil Service (Medical): Yes    Lack of Transportation (Non-Medical): No  Physical Activity: Not on file  Stress: Not on file  Social Connections: Unknown (03/12/2023)   Received from [facility]   Social Network    Social Network: Not on file  Intimate Partner Violence: Not At Risk (05/26/2023)   Humiliation, Afraid, Rape, and Kick questionnaire  Fear of Current or Ex-Partner: No    Emotionally Abused: No    Physically Abused: No    Sexually Abused: No   Family History  Problem Relation Age of Onset   Hypertension Mother    Diabetes Mother    No Known Allergies  Review of Systems  Constitutional: Negative.   HENT: Negative.    Eyes: Negative.   Respiratory:  Positive for shortness of breath.        Did not bring portable oxygen with him  Cardiovascular:  Negative for chest pain.  Gastrointestinal:  Negative for abdominal pain.  Genitourinary:  Negative for dysuria, frequency and urgency.  Musculoskeletal: Negative.   Skin: Negative.   Neurological: Negative.   Endo/Heme/Allergies: Negative.    Psychiatric/Behavioral: Negative.        Objective:     BP (!) 145/98 (BP Location: Left Arm, Patient Position: Sitting, Cuff Size: Large)   Pulse 98   Ht 5\' 11"  (1.803 m)   Wt (!) 334 lb (151.5 kg)   SpO2 90%   BMI 46.58 kg/m  BP Readings from Last 3 Encounters:  09/12/23 (!) 145/98  09/11/23 (!) 155/80  09/01/23 (!) 176/115   Wt Readings from Last 3 Encounters:  09/12/23 (!) 334 lb (151.5 kg)  09/11/23 (!) 338 lb 3.2 oz (153.4 kg)  08/11/23 (!) 342 lb 12.8 oz (155.5 kg)    Physical Exam Vitals and nursing note reviewed.  Constitutional:      Appearance: Normal appearance. He is obese.  HENT:     Head: Normocephalic and atraumatic.     Right Ear: External ear normal.     Left Ear: External ear normal.     Nose: Nose normal.     Mouth/Throat:     Mouth: Mucous membranes are moist.     Pharynx: Oropharynx is clear.  Eyes:     Extraocular Movements: Extraocular movements intact.     Conjunctiva/sclera: Conjunctivae normal.     Pupils: Pupils are equal, round, and reactive to light.  Cardiovascular:     Rate and Rhythm: Normal rate and regular rhythm.     Pulses: Normal pulses.     Heart sounds: Normal heart sounds.  Pulmonary:     Effort: Pulmonary effort is normal.     Breath sounds: Normal breath sounds.  Musculoskeletal:        General: Normal range of motion.     Cervical back: Normal range of motion and neck supple.  Skin:    General: Skin is warm and dry.  Neurological:     General: No focal deficit present.     Mental Status: He is alert and oriented to person, place, and time.  Psychiatric:        Mood and Affect: Mood normal.        Behavior: Behavior normal.        Thought Content: Thought content normal.        Judgment: Judgment normal.       Assessment & Plan:   Problem List Items Addressed This Visit       Cardiovascular and Mediastinum   CHF (congestive heart failure) (HCC) - Primary   Other Visit Diagnoses       Urinary  retention         1. Other congestive heart failure (HCC) (Primary) Gave written instructions on how to properly take medications.  Patient encouraged to limit fluid intake at this time.  Patient encouraged to continue daily weights.  Red  flags given for prompt evaluation of the emergency department.  Continue follow-up with heart failure clinic, follow-up with mobile unit as needed.  2. Urinary retention    I have reviewed the patient's medical history (PMH, PSH, Social History, Family History, Medications, and allergies) , and have been updated if relevant. I spent 30 minutes reviewing chart and  face to face time with patient.   Return if symptoms worsen or fail to improve.    Kasandra Knudsen Mayers, PA-C

## 2023-09-18 ENCOUNTER — Other Ambulatory Visit: Payer: Self-pay | Admitting: Family Medicine

## 2023-09-18 ENCOUNTER — Other Ambulatory Visit (HOSPITAL_COMMUNITY): Payer: Self-pay | Admitting: Family Medicine

## 2023-09-18 DIAGNOSIS — D508 Other iron deficiency anemias: Secondary | ICD-10-CM

## 2023-09-18 MED ORDER — TORSEMIDE 20 MG PO TABS: 80.0000 mg | ORAL_TABLET | Freq: Two times a day (BID) | ORAL | 6 refills | Status: AC

## 2023-09-21 ENCOUNTER — Other Ambulatory Visit (HOSPITAL_COMMUNITY): Payer: MEDICAID

## 2023-10-04 ENCOUNTER — Other Ambulatory Visit: Payer: MEDICAID

## 2023-10-04 ENCOUNTER — Telehealth (HOSPITAL_COMMUNITY): Payer: Self-pay | Admitting: Licensed Clinical Social Worker

## 2023-10-04 NOTE — Telephone Encounter (Signed)
 H&V Care Navigation CSW Progress Note  Clinical Social Worker contacted by Investment banker, corporate for patient to request help getting [employer] and [employer] statements for the year- CSW able to print off bills to send to Investment banker, corporate with patients permission.  Patient will be moved to first floor unit- anticipate within the month so he can have more accessible unit.  SDOH Screenings   Food Insecurity: No Food Insecurity (05/26/2023)  Housing: Low Risk  (05/26/2023)  Transportation Needs: Unmet Transportation Needs (09/11/2023)  Utilities: Not At Risk (05/26/2023)  Depression (PHQ2-9): Low Risk  (08/11/2023)  Financial Resource Strain: High Risk (02/27/2023)  Social Connections: Unknown (03/12/2023)   Received from [facility]  Tobacco Use: Medium Risk (09/12/2023)   Denton Flakes, LCSW Clinical Social Worker [facility] Desk#: [phone] Cell#: [phone]

## 2023-10-19 ENCOUNTER — Telehealth: Payer: Self-pay

## 2023-10-19 NOTE — Telephone Encounter (Signed)
Copied from CRM [id number]. Topic: General - Other >> Oct 19, 2023  2:42 PM Payton Doughty wrote: Reason for CRM: cary w/ [employer] calling to make sure pt is still seeing [name].  They will be sending an Rx request for updated O2.

## 2023-10-20 NOTE — Telephone Encounter (Signed)
 Noted

## 2023-10-23 ENCOUNTER — Other Ambulatory Visit (HOSPITAL_COMMUNITY): Payer: Self-pay | Admitting: Family Medicine

## 2023-10-23 ENCOUNTER — Other Ambulatory Visit: Payer: Self-pay | Admitting: Nurse Practitioner

## 2023-10-23 DIAGNOSIS — E669 Obesity, unspecified: Secondary | ICD-10-CM

## 2023-10-25 ENCOUNTER — Other Ambulatory Visit (HOSPITAL_COMMUNITY): Payer: Self-pay | Admitting: Family Medicine

## 2023-10-25 ENCOUNTER — Telehealth (HOSPITAL_COMMUNITY): Payer: Self-pay | Admitting: Licensed Clinical Social Worker

## 2023-10-25 ENCOUNTER — Other Ambulatory Visit (HOSPITAL_COMMUNITY): Payer: Self-pay

## 2023-10-25 NOTE — Telephone Encounter (Signed)
 H&V Care Navigation CSW Progress Note  Clinical Social Worker informed by patients former paramedic that pharmacy called to say they could not delivery meds to pt address and that they needed refill on xarelto  and PA on Jardiance .  CSW called pharmacy and informed of new address since pt moved to first floor unit on Friday of last week.    CSW messaged RN staff to send in refill of Xarelto .  CSW messaged patient advocate team for PA on patient Jardiance .   SDOH Screenings   Food Insecurity: No Food Insecurity (05/26/2023)  Housing: Low Risk  (05/26/2023)  Transportation Needs: Unmet Transportation Needs (09/11/2023)  Utilities: Not At Risk (05/26/2023)  Depression (PHQ2-9): Low Risk  (08/11/2023)  Financial Resource Strain: High Risk (02/27/2023)  Social Connections: Unknown (03/12/2023)   Received from [facility]  Tobacco Use: Medium Risk (09/12/2023)   Will continue to follow pt in clinic and assist as needed  Keelan Pomerleau H. Domino Holten, LCSW Clinical Social Worker [facility] Desk#: [phone] Cell#: [phone]

## 2023-10-26 ENCOUNTER — Telehealth (HOSPITAL_COMMUNITY): Payer: Self-pay

## 2023-10-26 ENCOUNTER — Other Ambulatory Visit (HOSPITAL_COMMUNITY): Payer: Self-pay

## 2023-10-26 NOTE — Telephone Encounter (Signed)
 [facility] Patient Advocate Encounter  Prior authorization for Jardiance  has been submitted and approved. Test billing returns $4 for 90 day supply.  Benjamin [name] Effective: 10/25/2023 to 10/24/2024  Rachel DEL, CPhT Rx Patient Advocate Phone: [phone]

## 2023-10-27 ENCOUNTER — Other Ambulatory Visit (HOSPITAL_COMMUNITY): Payer: MEDICAID

## 2023-10-30 ENCOUNTER — Ambulatory Visit: Payer: MEDICAID

## 2023-10-30 NOTE — Progress Notes (Signed)
 RX, Demo, Insurance faxed to [employer] received  Vasil Juhasz

## 2023-11-10 ENCOUNTER — Telehealth (HOSPITAL_COMMUNITY): Payer: Self-pay

## 2023-11-10 NOTE — Telephone Encounter (Signed)
Called to confirm/remind patient of their appointment at the [facility] on 11/13/23.   Patient reminded to bring all medications and/or complete list.  Confirmed patient has transportation. Gave directions, instructed to utilize valet parking.  Confirmed appointment prior to ending call.

## 2023-11-10 NOTE — Progress Notes (Signed)
 Advanced Heart Failure Clinic Note   PCP: Claiborne Rigg, NP Primary Cardiologist: Dietrich Pates, MD  HF Cardiologist: Dr. Shirlee Latch   HPI: Benjamin [name] is a 58 y.o. with hx of diastolic CHF, HTN, DM, Atrial fib, PE in April 2020 (on Xarelto), OSA, GERD, chronic elevation of troponin.    Admitted 01/21/2019 with increased dyspnea and had PE. He was anticoagulated.    Patient re-admitted 5/20 for syncope/cough/dyspnea, COVID+, CT positive for bilateral GGOs, admitted to [facility] and discharged after 6 days, on O2.  Rec'd Cards f/u.   Patient re-admitted 02/20/19 for bright red blood per rectum, eval'ed by GI, had EGD/colon, dx'd with hemorrhoidal bleeding, discharged after 9 days on O2. He was again Covid + .   Readmitted 03/11/19 with increased shortness of breath. He did not have home oxygen. He was discharged to home the next day.    He presented to [facility] ED 04/11/19 with lower extremity edema. COVID negative. Diuresed with lasix drip and transitioned to torsemide 40 mg twice daily. Had RHC with mild volume overload and preserved cardiac output. Discharge weight was 407.9 pounds.    He was readmitted with symptomatic anemia 05/28/19 with hgb 7.1. Received 1UPRBCs. GI consulted. He started on anusol and continued on stool softner.    Readmitted 07/16/19 with a/c diastolic CHF and treated w/ IV Lasix. Had 2 day hospital stay and was discharged on 10/29. Echo showed normal LVEF 65-70%, RV interpreted as normal but suspect significant RV dysfunction.  After diuresis w/ IV Lasix, he was placed back on home diuretic regimen, torsemide 100 mg bid. Lisinopril 5 mg was also added to regimen. AHF team was not consulted that admit.   He was seen back in clinic 07/30/19. Felt poorly. SOB at rest and worse w/ exertion. Also w/ orthopnea/PND. Significant wt gain of 20 lb up to 428 lb (dry wt ~407 lb), w/ marked abdominal distention and LEE and poor response to IV diuretics.  Once diuresed transitioned to torsemide 100  mg twice a day. D/C 408 pounds.   Zio patch on 10/20/19-No arrhythmia.   Re-admitted 08/19/20-08/24/20 for  A fib RVR. A. fib RVR in the setting of noncompliance ethanol and cocaine. Cardizem started and labetalol continued.  12/3 TEE (EF 60-65%) with DCCV successfully converted to SR. Diuresed with IV lasix and had AKI in setting of over diuresis. SCr 1.59 on admit, bumped to 2.5. Diuretics held + IVF hydration.   Admitted 12/24/20 with hypertensive emergency and respiratory failure. .   Admitted 4/22 with A Fib RVR & AMS. Diltiazem increased to 360 mg daily. Chemically converted to NSR.  + Cocaine.   Seen in ED 11/22 for COPD exacerbation.  Seen 11/02/21 and had been off all meds x 1 week, volume overloaded. Meds restarted and instructed to take metolazone 2.5 x 2 days w/ extra 40 kcl, however labs showed elevated BUN/SCr and left message to take metolazone x 1 day.  Re-enrolled in paramedicine.  Echo 5/23 EF 60-65%, moderate LVH, mid-cavity LV gradient to 29 mmHg, RV normal.   Follow up 11/23, NYHA III and volume mildly up. Torsemide increased to 60/40. Had recent cocaine relapse.  Follow up 1/24, NYHA III and volume mildly up. Torsemide increased to 60 bid, had recent cocaine relapse.  Echo 9/24: EF 70-75%, LV with no RWMA, severe concentric LVH, GIDD, RV normal  Seen in ED 12/24 with AF RVR. Required DCCV by EMS, convered to NSR. Discharged same day.    Today he returns for  HF follow up. Overall feeling fine. He is SOB walking on flat ground. Feels bloated and occasional palpitations. Left leg stays swollen, having difficulty urinating. Denies abnormal bleeding, CP, dizziness, or PND/Orthopnea. Appetite ok. No fever or chills. Weight at home 335-340 pounds. Taking all medications. Last used cocaine 40 days ago, no ETOH or tobacco use. Remains on 3 liters oxygen. Wears CPAP at night.  ECG (personally reviewed): atrial fibrillation with RVR, rates 120's  Labs (12/24): K 4.7, creatinine  2.53  Review of Systems: All systems reviewed and negative except as per HPI.   PMH: 1. Atrial fibrillation: Paroxysmal.  - DCCV (08/21/20): converted to SR 2. Pulmonary embolus: 5/20.  3. OHS/OSA: He is on home oxygen during the day and uses CPAP at night.  4. Morbid obesity.  5. Chronic diastolic CHF:  - RHC (7/20): mean RA 12, PA 40/25 mean 31, mean PCWP 23, CI 2.47, PVR 1.1 WU.  - Echo (10/20): EF 65-70%, mild LVH, normal RV size and systolic function.  - TEE (12/21): EF 60-65% - Cardiolite in 12/21: fixed apical defect is likely artifact, no ischemia.  - Echo (5/23): EF 60-65%, moderate LVH, mid-cavity LV gradient to 29 mmHg, RV normal.  6. Type 2 diabetes 7. HTN 8. Rectal bleeding: ?Hemorrhoidal.  9. COVID-19 infection 7/20.  10. ZIo Patch - 10/20/19 no arrhythmias 11. CKD: Stage 3 12. Depression 13. Cocaine abuse  Current Outpatient Medications  Medication Sig Dispense Refill   albuterol (PROVENTIL) (2.5 MG/3ML) 0.083% nebulizer solution Take 3 mLs (2.5 mg total) by nebulization every 4 (four) hours as needed for wheezing or shortness of breath. INHALE 1 VIAL BY NEBULIZATION EVERY 6 (SIX) HOURS AS NEEDED FOR WHEEZING OR SHORTNESS OF BREATH. Strength: (2.5 MG/3ML) 0.083% 150 mL 1   atorvastatin (LIPITOR) 40 MG tablet TAKE 1 TABLET (40 MG TOTAL) BY MOUTH AT BEDTIME. 90 tablet 3   Blood Glucose Monitoring Suppl (TRUE METRIX METER) w/Device KIT Use as instructed. Check blood glucose level by fingerstick twice per day. 1 kit 0   COMBIVENT RESPIMAT 20-100 MCG/ACT AERS respimat INHALE 1 PUFF INTO THE LUNGS EVERY 6 (SIX) HOURS. (Patient taking differently: Inhale 1 puff into the lungs every 6 (six) hours as needed for shortness of breath.) 4 g 5   diltiazem (CARDIZEM CD) 360 MG 24 hr capsule TAKE 1 CAPSULE (360 MG TOTAL) BY MOUTH DAILY. (NOON) 90 capsule 3   empagliflozin (JARDIANCE) 10 MG TABS tablet Take 1 tablet (10 mg total) by mouth daily. NEEDS FOLLOW UP APPOINTMENT FOR MORE  REFILLS 30 tablet 3   ferrous sulfate (FEROSUL) 325 (65 FE) MG tablet TAKE 1 TABLET (325 MG TOTAL) BY MOUTH DAILY AT NOON. 90 tablet 0   glucose blood (TRUE METRIX BLOOD GLUCOSE TEST) test strip Use as instructed. Check blood glucose level by fingerstick twice per day. 100 each 12   hydrALAZINE (APRESOLINE) 50 MG tablet TAKE 1 TABLET BY MOUTH 3 (THREE) TIMES DAILY. (NOON,EVENING,BEDTIME) 270 tablet 2   losartan (COZAAR) 50 MG tablet TAKE 1 TABLET (50 MG TOTAL) BY MOUTH DAILY. (NOON) 90 tablet 3   magnesium oxide (MAG-OX) 400 (240 Mg) MG tablet TAKE 1 TABLET (400 MG TOTAL) BY MOUTH 2 (TWO) TIMES DAILY (NOON+BEDTIME) 180 tablet 3   metFORMIN (GLUCOPHAGE) 500 MG tablet TAKE 1 TABLET BY MOUTH 2 (TWO) TIMES DAILY WITH A MEAL(NOON+BEDTIME) 180 tablet 0   omeprazole (PRILOSEC) 20 MG capsule TAKE 1 CAPSULE 2 (TWO) TIMES DAILY BEFORE A MEAL. TAKE FIRST CAPSULE 30 MIN PRIOR  TO EATING OR TAKING OTHER MEDICATIONS. (AM+BEDTIME) 180 capsule 1   OXYGEN Inhale 2-3 L into the lungs continuous.     potassium chloride SA (KLOR-CON M) 20 MEQ tablet Take 5 tablets (100 mEq total) by mouth every morning AND 4 tablets (80 mEq total) every evening. 270 tablet 6   rivaroxaban (XARELTO) 20 MG TABS tablet TAKE 1 TABLET (20 MG TOTAL) BY MOUTH DAILY WITH SUPPER (BEDTIME) 90 tablet 2   spironolactone (ALDACTONE) 50 MG tablet TAKE 1 TABLET (50 MG TOTAL) BY MOUTH DAILY. (NOON) 30 tablet 11   tamsulosin (FLOMAX) 0.4 MG CAPS capsule Take 0.4 mg by mouth daily after supper.     torsemide (DEMADEX) 20 MG tablet Take 4 tablets (80 mg total) by mouth 2 (two) times daily. 240 tablet 6   TRUEplus Lancets 28G MISC Use as instructed. Check blood glucose level by fingerstick twice per day. 200 each 3   Blood Pressure Monitor DEVI Please provide patient with insurance approved blood pressure device with L-XL cuff. BMI 55 (Patient not taking: Reported on 11/13/2023) 1 each 0   isosorbide mononitrate (IMDUR) 30 MG 24 hr tablet Take 1 tablet (30  mg total) by mouth daily. 30 tablet 0   Misc. Devices MISC Please provide patient with XL sized insurance approved compression stockings. ICD 10 R60.0 and I50.32 (Patient not taking: Reported on 11/13/2023) 1 each 0   No current facility-administered medications for this encounter.   No Known Allergies  Social History   Socioeconomic History   Marital status: Single    Spouse name: Not on file   Number of children: Not on file   Years of education: Not on file   Highest education level: Not on file  Occupational History   Not on file  Tobacco Use   Smoking status: Former   Smokeless tobacco: Never   Tobacco comments:    smoked weed in the past  Vaping Use   Vaping status: Never Used  Substance and Sexual Activity   Alcohol use: Not Currently   Drug use: Yes    Types: Marijuana, Cocaine   Sexual activity: Not Currently  Other Topics Concern   Not on file  Social History Narrative   Not on file   Social Drivers of Health   Financial Resource Strain: High Risk (02/27/2023)   Overall Financial Resource Strain (CARDIA)    Difficulty of Paying Living Expenses: Hard  Food Insecurity: No Food Insecurity (05/26/2023)   Hunger Vital Sign    Worried About Running Out of Food in the Last Year: Never true    Ran Out of Food in the Last Year: Never true  Transportation Needs: Unmet Transportation Needs (11/13/2023)   PRAPARE - Administrator, Civil Service (Medical): Yes    Lack of Transportation (Non-Medical): No  Physical Activity: Not on file  Stress: Not on file  Social Connections: Unknown (03/12/2023)   Received from [facility]   Social Network    Social Network: Not on file  Intimate Partner Violence: Not At Risk (05/26/2023)   Humiliation, Afraid, Rape, and Kick questionnaire    Fear of Current or Ex-Partner: No    Emotionally Abused: No    Physically Abused: No    Sexually Abused: No   Family History  Problem Relation Age of Onset    Hypertension Mother    Diabetes Mother    BP 116/80   Pulse 85   Wt (!) 152.2 kg (335 lb 9.6 oz)  SpO2 95% Comment: 2-liters of room oxygen  BMI 46.81 kg/m   Wt Readings from Last 3 Encounters:  11/13/23 (!) 152.2 kg (335 lb 9.6 oz)  09/12/23 (!) 151.5 kg (334 lb)  09/11/23 (!) 153.4 kg (338 lb 3.2 oz)   PHYSICAL EXAM: General:  NAD. No resp difficulty, walked into clinic on 3 L oxygen HEENT: Normal Neck: Supple. No JVD. Cor: Tachy irregular rate & rhythm. No rubs, gallops or murmurs. Lungs: Clear, diminished in bases Abdomen: Soft, obese, nontender, nondistended.  Extremities: No cyanosis, clubbing, rash, trace LLE edema Neuro: Alert & oriented x 3, moves all 4 extremities w/o difficulty. Affect pleasant.  ASSESSMENT & PLAN: 1. Chronic diastolic CHF: Echo 5/23 showed EF 60-65%, moderate LVH, mid-cavity LV gradient to 29 mmHg, RV normal.  Suspect LVH is due to long-standing HTN. Echo 9/24: EF 70-75%, LV with no RWMA, severe concentric LVH, GIDD, RV normal. Stable NYHA class IIIb symptoms. Exam difficult for volume but he does not appear markedly volume overloaded. - Continue torsemide 80 mg bid + 100/80 KCL. BMET and BNP today. - Continue Jardiance 10 mg daily. No GU symptoms. - Continue spironolactone 50 mg daily.  - Continue losartan 50 mg daily. - Continue hydralazine 50 mg tid + Imdur 30 mg daily. - Cannot get Cardiomems as he has Medicaid.  2. Atrial fibrillation: Paroxysmal.  DCCV by EMS 09/01/23 for a fib RVR rate 200s. AF RVR on ECG today, he is asymptomatic. He has missed several doses of his Xarelto. Recommend TEE/DCCV soon with Dr. Shirlee Latch, he is agreeable Discussed with Dr. Shirlee Latch. Informed Consent   Shared Decision Making/Informed Consent   The risks [stroke, cardiac arrhythmias rarely resulting in the need for a temporary or permanent pacemaker, skin irritation or burns, esophageal damage, perforation (1:10,000 risk), bleeding, pharyngeal hematoma as well as other  potential complications associated with conscious sedation including aspiration, arrhythmia, respiratory failure and death], benefits (treatment guidance, restoration of normal sinus rhythm, diagnostic support) and alternatives of a transesophageal echocardiogram guided cardioversion were discussed in detail with Mr. Vonderhaar and he is willing to proceed.     - Continue current dose of diltiazem.  - Continue Xarelto. No bleeding issues. Discussed importance of medication compliance. CBC today. 3. H/o PE: 5/20, Continue Xarelto.  4. OHS/OSA: Uses O2 during the day and CPAP at night.  5. HTN: BP stable today. - Continue current regimen. 6. DM: Per PCP. Most recent hgb A1c 6.7. - No change. 7. Obesity: Body mass index is 46.81 kg/m.  - He has diabetes and has an appt with PharmD this week to see if we can get him semaglutide.  8. Cocaine: still using, cessation discussed. - Engage HFSW  Arrange TEE/DCCV with Dr. Shirlee Latch soon. Follow up 2 weeks after procedure with APP.  [facility] FNP-BC 11/13/2023

## 2023-11-13 ENCOUNTER — Telehealth (HOSPITAL_COMMUNITY): Payer: Self-pay | Admitting: Licensed Clinical Social Worker

## 2023-11-13 ENCOUNTER — Encounter (HOSPITAL_COMMUNITY): Payer: Self-pay

## 2023-11-13 ENCOUNTER — Ambulatory Visit (HOSPITAL_COMMUNITY)
Admission: RE | Admit: 2023-11-13 | Discharge: 2023-11-13 | Disposition: A | Discharge status: Home / Self Care | Payer: MEDICAID | Source: Ambulatory Visit | Attending: Family Medicine | Admitting: Family Medicine

## 2023-11-13 VITALS — BP 116/80 | HR 85 | Wt 335.6 lb

## 2023-11-13 DIAGNOSIS — E1122 Type 2 diabetes mellitus with diabetic chronic kidney disease: Secondary | ICD-10-CM | POA: Diagnosis not present

## 2023-11-13 DIAGNOSIS — Z91148 Patient's other noncompliance with medication regimen for other reason: Secondary | ICD-10-CM | POA: Insufficient documentation

## 2023-11-13 DIAGNOSIS — Z6841 Body Mass Index (BMI) 40.0 and over, adult: Secondary | ICD-10-CM | POA: Diagnosis not present

## 2023-11-13 DIAGNOSIS — K219 Gastro-esophageal reflux disease without esophagitis: Secondary | ICD-10-CM | POA: Insufficient documentation

## 2023-11-13 DIAGNOSIS — Z5986 Financial insecurity: Secondary | ICD-10-CM | POA: Insufficient documentation

## 2023-11-13 DIAGNOSIS — Z86711 Personal history of pulmonary embolism: Secondary | ICD-10-CM | POA: Insufficient documentation

## 2023-11-13 DIAGNOSIS — G4733 Obstructive sleep apnea (adult) (pediatric): Secondary | ICD-10-CM | POA: Diagnosis not present

## 2023-11-13 DIAGNOSIS — N183 Chronic kidney disease, stage 3 unspecified: Secondary | ICD-10-CM | POA: Insufficient documentation

## 2023-11-13 DIAGNOSIS — I130 Hypertensive heart and chronic kidney disease with heart failure and stage 1 through stage 4 chronic kidney disease, or unspecified chronic kidney disease: Secondary | ICD-10-CM | POA: Insufficient documentation

## 2023-11-13 DIAGNOSIS — I5032 Chronic diastolic (congestive) heart failure: Secondary | ICD-10-CM

## 2023-11-13 DIAGNOSIS — I10 Essential (primary) hypertension: Secondary | ICD-10-CM | POA: Diagnosis not present

## 2023-11-13 DIAGNOSIS — I2699 Other pulmonary embolism without acute cor pulmonale: Secondary | ICD-10-CM

## 2023-11-13 DIAGNOSIS — I480 Paroxysmal atrial fibrillation: Secondary | ICD-10-CM

## 2023-11-13 DIAGNOSIS — F1910 Other psychoactive substance abuse, uncomplicated: Secondary | ICD-10-CM

## 2023-11-13 DIAGNOSIS — Z5982 Transportation insecurity: Secondary | ICD-10-CM | POA: Diagnosis not present

## 2023-11-13 DIAGNOSIS — E118 Type 2 diabetes mellitus with unspecified complications: Secondary | ICD-10-CM

## 2023-11-13 DIAGNOSIS — Z79899 Other long term (current) drug therapy: Secondary | ICD-10-CM | POA: Diagnosis not present

## 2023-11-13 DIAGNOSIS — Z7984 Long term (current) use of oral hypoglycemic drugs: Secondary | ICD-10-CM | POA: Diagnosis not present

## 2023-11-13 DIAGNOSIS — Z7901 Long term (current) use of anticoagulants: Secondary | ICD-10-CM | POA: Insufficient documentation

## 2023-11-13 LAB — BASIC METABOLIC PANEL
Anion gap: 8 (ref 5–15)
BUN: 28 mg/dL — ABNORMAL HIGH (ref 6–20)
CO2: 30 mmol/L (ref 22–32)
Calcium: 9.4 mg/dL (ref 8.9–10.3)
Chloride: 98 mmol/L (ref 98–111)
Creatinine, Ser: 1.75 mg/dL — ABNORMAL HIGH (ref 0.61–1.24)
GFR, Estimated: 45 mL/min — ABNORMAL LOW (ref >60)
Glucose, Bld: 118 mg/dL — ABNORMAL HIGH (ref 70–99)
Potassium: 4.4 mmol/L (ref 3.5–5.1)
Sodium: 136 mmol/L (ref 135–145)

## 2023-11-13 LAB — CBC
HCT: 52.2 % — ABNORMAL HIGH (ref 39.0–52.0)
Hemoglobin: 16 g/dL (ref 13.0–17.0)
MCH: 24.6 pg — ABNORMAL LOW (ref 26.0–34.0)
MCHC: 30.7 g/dL (ref 30.0–36.0)
MCV: 80.2 fL (ref 80.0–100.0)
Platelets: 393 10*3/uL (ref 150–400)
RBC: 6.51 MIL/uL — ABNORMAL HIGH (ref 4.22–5.81)
RDW: 16.7 % — ABNORMAL HIGH (ref 11.5–15.5)
WBC: 6.6 10*3/uL (ref 4.0–10.5)
nRBC: 0 % (ref 0.0–0.2)

## 2023-11-13 LAB — BRAIN NATRIURETIC PEPTIDE: B Natriuretic Peptide: 103.4 pg/mL — ABNORMAL HIGH (ref 0.0–100.0)

## 2023-11-13 MED ORDER — BLOOD PRESSURE MONITOR DEVI: 0 refills | Status: DC

## 2023-11-13 NOTE — Telephone Encounter (Signed)
 H&V Care Navigation CSW Progress Note  Clinical Social Worker received call from pt requesting help with transportation to todays appt as his ride fell through.   Was able to set up [employer] taxi to bring to appt.   SDOH Screenings   Food Insecurity: No Food Insecurity (05/26/2023)  Housing: Low Risk  (05/26/2023)  Transportation Needs: Unmet Transportation Needs (11/13/2023)  Utilities: Not At Risk (05/26/2023)  Depression (PHQ2-9): Low Risk  (08/11/2023)  Financial Resource Strain: High Risk (02/27/2023)  Social Connections: Unknown (03/12/2023)   Received from [facility]  Tobacco Use: Medium Risk (09/12/2023)   11/13/2023  Benjamin [name] DOB: 1966/06/01 MRN: [id number]   RIDER WAIVER AND RELEASE OF LIABILITY  For the purposes of helping with transportation needs, [facility] partners with outside transportation providers (taxi companies, [place], Catering manager.) to give [employer] patients or other approved people the choice of on-demand rides [name]") to our buildings for non-emergency visits.  By using [employer], I, the person signing this document, on behalf of myself and/or any legal minors (in my care using the [employer]), agree:  Science writer given to me are supplied by independent, outside transportation providers who do not work for, or have any affiliation with, [employer]. [place] is not a transportation company. [facility] has no control over the quality or safety of the rides I get using [employer]. [place] has no control over whether any outside ride will happen on time or not. [place] gives no guarantee on the reliability, quality, safety, or availability on any rides, or that no mistakes will happen. I know and accept that traveling by vehicle (car, truck, SVU, Zenaida Niece, bus, taxi, etc.) has risks of serious injuries such as disability, being paralyzed, and death. I know and agree the risk of using [employer] is mine alone, and not [employer]. Transport Services are provided "as is" and as are available. The transportation providers are in charge for all inspections and care of the vehicles used to provide these rides. I agree not to take legal action against [place], its agents, employees, officers, directors, representatives, insurers, attorneys, assigns, successors, subsidiaries, and affiliates at any time for any reasons related directly or indirectly to using [employer]. I also agree not to take legal action against [place] or its affiliates for any injury, death, or damage to property caused by or related to using [employer]. I have read this Waiver and Release of Liability, and I understand the terms used in it and their legal meaning. This Waiver is freely and voluntarily given with the understanding that my right (or any legal minors) to legal action against [place] relating to [employer] is knowingly given up to use these services.   I attest that I read the Ride Waiver and Release of Liability to Benjamin [name], gave Mr. Popwell the opportunity to ask questions and answered the questions asked (if any). I affirm that Benjamin [name] then provided consent for assistance with transportation.     [name]

## 2023-11-13 NOTE — Progress Notes (Signed)
 H&V Care Navigation CSW Progress Note  Clinical Social Worker met with pt to check in.  Pt very emotional during visit and states he has been having trouble remaining sober.  Had been attending outpatient visits with [facility] but stopped due to illness and hasn't attended for about 2 weeks.  Unclear timeline of drug use original said used last 40 days ago but then admitted to use on his birthday which was only 4 days ago.  Patient is very disappointed in himself and expressed feeling of wanting to no longer be here- CSW inquired about thoughts of harming himself and he reports he has not considered harming himself but sometimes wishes it was just all over.  Has struggled frequently with feelings of depression especially since his mom passed away several years ago.  CSW has worked with patient on getting counseling in the past and he attends for a time but then stops and falls back into old habits.  States he didn't feel this way while he was going to [employer] and acknowledges importance of calling them to get back involved- will plan to do so today.  CSW will follow up with patient and assess need for further mental health intervention from there.  Engaged in supportive listening and help patient see support system he has available. Will continue to follow and assist as needed.  SDOH Screenings   Food Insecurity: No Food Insecurity (05/26/2023)  Housing: Low Risk  (05/26/2023)  Transportation Needs: Unmet Transportation Needs (11/13/2023)  Utilities: Not At Risk (05/26/2023)  Depression (PHQ2-9): Low Risk  (08/11/2023)  Financial Resource Strain: High Risk (02/27/2023)  Social Connections: Unknown (03/12/2023)   Received from [facility]  Tobacco Use: Medium Risk (09/12/2023)   [name], LCSW Clinical Social Worker Advanced Heart Failure Clinic Desk#: [phone] Cell#: [phone]

## 2023-11-13 NOTE — Patient Instructions (Addendum)
 Thank you for coming in today  If you had labs drawn today, any labs that are abnormal the clinic will call you No news is good news  You have been given a prescription for compression hose. And a list of places who supply them. Please wear your compression hose daily, place them on as soon as you get up in the morning and remove before you go to bed at night.   Medications: Please do not miss any doses of Xarelto  Follow up appointments:  You are scheduled for a TEE/Cardioversion/TEE Cardioversion on Wednesday 11/29/2023 with Dr. Shirlee Latch.  Please arrive at the [facility] (Main Entrance A) at [facility]: [address] [place], [place] [address] at 11 am. DIET: Nothing to eat or drink after midnight except a sip of water with medications (see medication instructions below)  Medication Instructions: Hold Torsemide, Metformin  Continue your anticoagulant: Xarelto You will need to continue your anticoagulant after your procedure until you  are told by your  Provider that it is safe to stop    You must have a responsible person to drive you home and stay in the waiting area during your procedure. Failure to do so could result in cancellation.  Bring your insurance cards.  *Special Note: Every effort is made to have your procedure done on time. Occasionally there are emergencies that occur at the hospital that may cause delays. Please be patient if a delay does occur.     Your physician recommends that you schedule a follow-up appointment in:  2 weeks after TEE/Cardioversion   Do the following things EVERYDAY: Weigh yourself in the morning before breakfast. Write it down and keep it in a log. Take your medicines as prescribed Eat low salt foods--Limit salt (sodium) to 2000 mg per day.  Stay as active as you can everyday Limit all fluids for the day to less than 2 liters   At the Advanced [facility] Failure Clinic, you and your health needs are our priority. As part of our  continuing mission to provide you with exceptional heart care, we have created designated Provider Care Teams. These Care Teams include your primary Cardiologist (physician) and Advanced Practice Providers (APPs- Physician Assistants and Nurse Practitioners) who all work together to provide you with the care you need, when you need it.   You may see any of the following providers on your designated Care Team at your next follow up: Dr Arvilla Meres Dr Marca Ancona Dr. Marcos Eke, NP Robbie Lis, [facility] [place], [place] Brynda Peon, NP Karle Plumber, PharmD   Please be sure to bring in all your medications bottles to every appointment.    Thank you for choosing [facility]  If you have any questions or concerns before your next appointment please send [place] a message through [place] or call our office at [phone].    TO LEAVE A MESSAGE FOR THE NURSE SELECT OPTION 2, PLEASE LEAVE A MESSAGE INCLUDING: YOUR NAME DATE OF BIRTH CALL BACK NUMBER REASON FOR CALL**this is important as we prioritize the call backs  YOU WILL RECEIVE A CALL BACK THE SAME DAY AS LONG AS YOU CALL BEFORE 4:00 PM

## 2023-11-15 ENCOUNTER — Telehealth (HOSPITAL_COMMUNITY): Payer: Self-pay | Admitting: Licensed Clinical Social Worker

## 2023-11-15 NOTE — Telephone Encounter (Signed)
 CSW called pt to check in re mental health and efforts to reestablish with outpatient treatment.  Unable to reach- sent message requesting return call  [name], LCSW Clinical Social Worker Advanced [facility] Clinic Desk#: [phone] Cell#: [phone]

## 2023-11-15 NOTE — Telephone Encounter (Addendum)
 H&V Care Navigation CSW Progress Note  Clinical Social Worker received return call from pt.    States that he has not called [facility] - called after our original call and then informed CSW that he will be able to go Monday to continue treatment.  Pt reports concerns with becoming more out of breath- CSW discussed with provider who advised patient to go to ED if he felt poorly enough or to come to appt tomorrow to be reassessed if needed to be treated for afib sooner than current appt on 3/12- pt will come to clinic tomorrow morning to be evaluated.  [employer] taxi set up for ride to clinic tomorrow- pick up for 9am  CSW will continue to follow and assist as needed  SDOH Screenings   Food Insecurity: No Food Insecurity (05/26/2023)  Housing: Low Risk  (05/26/2023)  Transportation Needs: Unmet Transportation Needs (11/13/2023)  Utilities: Not At Risk (05/26/2023)  Depression (PHQ2-9): Low Risk  (08/11/2023)  Financial Resource Strain: High Risk (02/27/2023)  Social Connections: Unknown (03/12/2023)   Received from [facility]  Tobacco Use: Medium Risk (11/13/2023)   [name], LCSW Clinical Social Worker [facility] Desk#: [phone] Cell#: [phone]

## 2023-11-16 ENCOUNTER — Telehealth (HOSPITAL_COMMUNITY): Payer: Self-pay

## 2023-11-16 ENCOUNTER — Encounter (HOSPITAL_COMMUNITY): Payer: Self-pay

## 2023-11-16 ENCOUNTER — Telehealth: Payer: Self-pay | Admitting: Pharmacy Technician

## 2023-11-16 ENCOUNTER — Telehealth: Payer: Self-pay | Admitting: Pharmacist

## 2023-11-16 ENCOUNTER — Ambulatory Visit: Payer: MEDICAID

## 2023-11-16 ENCOUNTER — Ambulatory Visit
Admission: RE | Admit: 2023-11-16 | Discharge: 2023-11-16 | Disposition: A | Discharge status: Home / Self Care | Payer: MEDICAID | Source: Ambulatory Visit | Attending: Physician Assistant | Admitting: Physician Assistant

## 2023-11-16 ENCOUNTER — Other Ambulatory Visit (HOSPITAL_COMMUNITY): Payer: Self-pay

## 2023-11-16 VITALS — BP 98/64 | HR 101 | Wt 334.8 lb

## 2023-11-16 DIAGNOSIS — G4733 Obstructive sleep apnea (adult) (pediatric): Secondary | ICD-10-CM | POA: Insufficient documentation

## 2023-11-16 DIAGNOSIS — I5033 Acute on chronic diastolic (congestive) heart failure: Secondary | ICD-10-CM | POA: Insufficient documentation

## 2023-11-16 DIAGNOSIS — Z7901 Long term (current) use of anticoagulants: Secondary | ICD-10-CM | POA: Diagnosis not present

## 2023-11-16 DIAGNOSIS — K219 Gastro-esophageal reflux disease without esophagitis: Secondary | ICD-10-CM | POA: Diagnosis not present

## 2023-11-16 DIAGNOSIS — I110 Hypertensive heart disease with heart failure: Secondary | ICD-10-CM | POA: Diagnosis not present

## 2023-11-16 DIAGNOSIS — I480 Paroxysmal atrial fibrillation: Secondary | ICD-10-CM | POA: Diagnosis not present

## 2023-11-16 DIAGNOSIS — E119 Type 2 diabetes mellitus without complications: Secondary | ICD-10-CM | POA: Insufficient documentation

## 2023-11-16 DIAGNOSIS — Z7984 Long term (current) use of oral hypoglycemic drugs: Secondary | ICD-10-CM | POA: Diagnosis not present

## 2023-11-16 DIAGNOSIS — F1410 Cocaine abuse, uncomplicated: Secondary | ICD-10-CM

## 2023-11-16 DIAGNOSIS — I491 Atrial premature depolarization: Secondary | ICD-10-CM | POA: Insufficient documentation

## 2023-11-16 DIAGNOSIS — Z9981 Dependence on supplemental oxygen: Secondary | ICD-10-CM | POA: Insufficient documentation

## 2023-11-16 DIAGNOSIS — R5383 Other fatigue: Secondary | ICD-10-CM | POA: Diagnosis present

## 2023-11-16 DIAGNOSIS — I10 Essential (primary) hypertension: Secondary | ICD-10-CM | POA: Diagnosis not present

## 2023-11-16 DIAGNOSIS — R42 Dizziness and giddiness: Secondary | ICD-10-CM | POA: Diagnosis not present

## 2023-11-16 DIAGNOSIS — Z79899 Other long term (current) drug therapy: Secondary | ICD-10-CM | POA: Insufficient documentation

## 2023-11-16 DIAGNOSIS — Z86711 Personal history of pulmonary embolism: Secondary | ICD-10-CM | POA: Insufficient documentation

## 2023-11-16 DIAGNOSIS — Z6841 Body Mass Index (BMI) 40.0 and over, adult: Secondary | ICD-10-CM | POA: Insufficient documentation

## 2023-11-16 DIAGNOSIS — J9610 Chronic respiratory failure, unspecified whether with hypoxia or hypercapnia: Secondary | ICD-10-CM | POA: Diagnosis not present

## 2023-11-16 LAB — BASIC METABOLIC PANEL
Anion gap: 10 (ref 5–15)
BUN: 26 mg/dL — ABNORMAL HIGH (ref 6–20)
CO2: 28 mmol/L (ref 22–32)
Calcium: 9.1 mg/dL (ref 8.9–10.3)
Chloride: 99 mmol/L (ref 98–111)
Creatinine, Ser: 1.97 mg/dL — ABNORMAL HIGH (ref 0.61–1.24)
GFR, Estimated: 39 mL/min — ABNORMAL LOW (ref >60)
Glucose, Bld: 108 mg/dL — ABNORMAL HIGH (ref 70–99)
Potassium: 3.9 mmol/L (ref 3.5–5.1)
Sodium: 137 mmol/L (ref 135–145)

## 2023-11-16 LAB — BRAIN NATRIURETIC PEPTIDE: B Natriuretic Peptide: 81.1 pg/mL (ref 0.0–100.0)

## 2023-11-16 MED ORDER — METOLAZONE 2.5 MG PO TABS
2.5000 mg | ORAL_TABLET | Freq: Once | ORAL | Status: AC
  Administered 2023-11-16: 2.5 mg via ORAL

## 2023-11-16 NOTE — Telephone Encounter (Signed)
 Pharmacy Patient Advocate Encounter   Received notification from Pt Calls Messages that prior authorization for mounjaro is required/requested.   Insurance verification completed.   The patient is insured through [facility] .   Per test claim: PA required; PA submitted to above mentioned insurance via CoverMyMeds Key/confirmation #/EOC [id number] Status is pending

## 2023-11-16 NOTE — Patient Instructions (Signed)
 Medication Changes:  1 DOSE OF 2.5MG  METOLAZONE GIVEN TODAY AT CLINIC  GO STRAIGHT HOME AND TAKE 80MG  TORSEMIDE AFTER METOLAZONE DOSE   Lab Work:  Labs done today, your results will be available in MyChart, we will contact you for abnormal readings.  Follow-Up in: NEXT WEEK AS SCHEDULED   At the [facility], you and your health needs are our priority. We have a designated team specialized in the treatment of Heart Failure. This Care Team includes your primary Heart Failure Specialized Cardiologist (physician), Advanced Practice Providers (APPs- Physician Assistants and Nurse Practitioners), and Pharmacist who all work together to provide you with the care you need, when you need it.   You may see any of the following providers on your designated Care Team at your next follow up:  Dr. Arvilla Meres Dr. Marca Ancona Dr. Dorthula Nettles Dr. Theresia Bough Tonye Becket, NP Robbie Lis, [place] [name] [place], [place] Brynda Peon, NP [place] Lee, NP Karle Plumber, PharmD   Please be sure to bring in all your medications bottles to every appointment.   Need to Contact us:  If you have any questions or concerns before your next appointment please send [place] a message through [place] or call our office at [phone].    TO LEAVE A MESSAGE FOR THE NURSE SELECT OPTION 2, PLEASE LEAVE A MESSAGE INCLUDING: YOUR NAME DATE OF BIRTH CALL BACK NUMBER REASON FOR CALL**this is important as we prioritize the call backs  YOU WILL RECEIVE A CALL BACK THE SAME DAY AS LONG AS YOU CALL BEFORE 4:00 PM

## 2023-11-16 NOTE — Addendum Note (Signed)
 Encounter addended by: Baird Cancer, RN on: 11/16/2023 11:00 AM  Actions taken: Order list changed, Diagnosis association updated, MAR administration accepted

## 2023-11-16 NOTE — Progress Notes (Signed)
 [facility] Note   PCP: Claiborne Rigg, NP Primary Cardiologist: Dietrich Pates, MD  HF Cardiologist: Dr. Shirlee Latch   HPI: Benjamin [name] is a 58 y.o. with hx of diastolic CHF, HTN, DM, Atrial fib, PE in April 2020 (on Xarelto), OSA, GERD, GI bleeding d/t hemorrhoids, chronic elevation of troponin, morbid obesity, OSA on CPAP, chronic respiratory failure on 3L O2 with exertion, ETOH and cocaine abuse, noncompliance.    He presented to [facility] ED 04/11/19 with lower extremity edema.  Diuresed with lasix drip and transitioned to torsemide 40 mg twice daily. Had RHC with mild volume overload and preserved cardiac output. Discharge weight was 407.9 pounds.    He was readmitted with symptomatic anemia 05/28/19 with hgb 7.1. Received 1UPRBCs. GI consulted. He started on anusol and continued on stool softner.    Readmitted 07/16/19 with a/c diastolic CHF and treated w/ IV Lasix. Echo showed normal LVEF 65-70%, RV interpreted as normal but suspect significant RV dysfunction.  Re-admitted 08/19/20-08/24/20 for  A fib RVR. A. fib RVR in the setting of noncompliance, alcohol use and cocaine. Underwent TEE/DCCV to SR.   Echo 5/23 EF 60-65%, moderate LVH, mid-cavity LV gradient to 29 mmHg, RV normal.   Echo 9/24: EF 70-75%, LV with no RWMA, severe concentric LVH, GIDD, RV normal  Had ED visit 12/24 with a fib RVR. Required DCCV by EMS, convered to NSR. Discharged same day.    He was seen in clinic for follow-up 02/24 and was in afib with RVR. TEE/DCCV arranged. Had missed doses of Xarelto.  Patient called HF CSW yesterday and reported feeling poorly with increasing dyspnea. F/u arranged for today.   He reports increasing shortness of breath, fatigue and dizziness with exertion over the last few days. He believes all of his symptoms are due to recurrence of atrial fibrillation. Wants to know if cardioversion can be done sooner. He reports adherence with all medications but has not taken his  diuretic this morning. His medications are bubble packed. Reports he isn't urinating as much.   He states he last used cocaine 8-9 days ago, blames peer pressure from strippers.     Review of Systems: All systems reviewed and negative except as per HPI.    Current Outpatient Medications  Medication Sig Dispense Refill   albuterol (PROVENTIL) (2.5 MG/3ML) 0.083% nebulizer solution Take 3 mLs (2.5 mg total) by nebulization every 4 (four) hours as needed for wheezing or shortness of breath. INHALE 1 VIAL BY NEBULIZATION EVERY 6 (SIX) HOURS AS NEEDED FOR WHEEZING OR SHORTNESS OF BREATH. Strength: (2.5 MG/3ML) 0.083% 150 mL 1   atorvastatin (LIPITOR) 40 MG tablet TAKE 1 TABLET (40 MG TOTAL) BY MOUTH AT BEDTIME. 90 tablet 3   Blood Glucose Monitoring Suppl (TRUE METRIX METER) w/Device KIT Use as instructed. Check blood glucose level by fingerstick twice per day. 1 kit 0   Blood Pressure Monitor DEVI Please provide patient with insurance approved blood pressure device with L-XL cuff. BMI 55 1 each 0   COMBIVENT RESPIMAT 20-100 MCG/ACT AERS respimat INHALE 1 PUFF INTO THE LUNGS EVERY 6 (SIX) HOURS. (Patient taking differently: Inhale 1 puff into the lungs every 6 (six) hours as needed for shortness of breath.) 4 g 5   diltiazem (CARDIZEM CD) 360 MG 24 hr capsule TAKE 1 CAPSULE (360 MG TOTAL) BY MOUTH DAILY. (NOON) 90 capsule 3   empagliflozin (JARDIANCE) 10 MG TABS tablet Take 1 tablet (10 mg total) by mouth daily. NEEDS FOLLOW UP APPOINTMENT  FOR MORE REFILLS 30 tablet 3   ferrous sulfate (FEROSUL) 325 (65 FE) MG tablet TAKE 1 TABLET (325 MG TOTAL) BY MOUTH DAILY AT NOON. 90 tablet 0   glucose blood (TRUE METRIX BLOOD GLUCOSE TEST) test strip Use as instructed. Check blood glucose level by fingerstick twice per day. 100 each 12   hydrALAZINE (APRESOLINE) 50 MG tablet TAKE 1 TABLET BY MOUTH 3 (THREE) TIMES DAILY. (NOON,EVENING,BEDTIME) 270 tablet 2   isosorbide mononitrate (IMDUR) 30 MG 24 hr tablet Take  1 tablet (30 mg total) by mouth daily. 30 tablet 0   losartan (COZAAR) 50 MG tablet TAKE 1 TABLET (50 MG TOTAL) BY MOUTH DAILY. (NOON) 90 tablet 3   magnesium oxide (MAG-OX) 400 (240 Mg) MG tablet TAKE 1 TABLET (400 MG TOTAL) BY MOUTH 2 (TWO) TIMES DAILY (NOON+BEDTIME) 180 tablet 3   metFORMIN (GLUCOPHAGE) 500 MG tablet TAKE 1 TABLET BY MOUTH 2 (TWO) TIMES DAILY WITH A MEAL(NOON+BEDTIME) 180 tablet 0   Misc. Devices MISC Please provide patient with XL sized insurance approved compression stockings. ICD 10 R60.0 and I50.32 1 each 0   omeprazole (PRILOSEC) 20 MG capsule TAKE 1 CAPSULE 2 (TWO) TIMES DAILY BEFORE A MEAL. TAKE FIRST CAPSULE 30 MIN PRIOR TO EATING OR TAKING OTHER MEDICATIONS. (AM+BEDTIME) 180 capsule 1   OXYGEN Inhale 2-3 L into the lungs continuous.     potassium chloride SA (KLOR-CON M) 20 MEQ tablet Take 5 tablets (100 mEq total) by mouth every morning AND 4 tablets (80 mEq total) every evening. 270 tablet 6   rivaroxaban (XARELTO) 20 MG TABS tablet TAKE 1 TABLET (20 MG TOTAL) BY MOUTH DAILY WITH SUPPER (BEDTIME) 90 tablet 2   spironolactone (ALDACTONE) 50 MG tablet TAKE 1 TABLET (50 MG TOTAL) BY MOUTH DAILY. (NOON) 30 tablet 11   tamsulosin (FLOMAX) 0.4 MG CAPS capsule Take 0.4 mg by mouth daily after supper.     torsemide (DEMADEX) 20 MG tablet Take 4 tablets (80 mg total) by mouth 2 (two) times daily. 240 tablet 6   TRUEplus Lancets 28G MISC Use as instructed. Check blood glucose level by fingerstick twice per day. 200 each 3   No current facility-administered medications for this encounter.   No Known Allergies  Social History   Socioeconomic History   Marital status: Single    Spouse name: Not on file   Number of children: Not on file   Years of education: Not on file   Highest education level: Not on file  Occupational History   Not on file  Tobacco Use   Smoking status: Former   Smokeless tobacco: Never   Tobacco comments:    smoked weed in the past  Vaping Use    Vaping status: Never Used  Substance and Sexual Activity   Alcohol use: Not Currently   Drug use: Yes    Types: Marijuana, Cocaine   Sexual activity: Not Currently  Other Topics Concern   Not on file  Social History Narrative   Not on file   Social Drivers of Health   Financial Resource Strain: High Risk (02/27/2023)   Overall Financial Resource Strain (CARDIA)    Difficulty of Paying Living Expenses: Hard  Food Insecurity: No Food Insecurity (05/26/2023)   Hunger Vital Sign    Worried About Running Out of Food in the Last Year: Never true    Ran Out of Food in the Last Year: Never true  Transportation Needs: Unmet Transportation Needs (11/13/2023)   PRAPARE - Transportation  Lack of Transportation (Medical): Yes    Lack of Transportation (Non-Medical): No  Physical Activity: Not on file  Stress: Not on file  Social Connections: Unknown (03/12/2023)   Received from [facility]   Social Network    Social Network: Not on file  Intimate Partner Violence: Not At Risk (05/26/2023)   Humiliation, Afraid, Rape, and Kick questionnaire    Fear of Current or Ex-Partner: No    Emotionally Abused: No    Physically Abused: No    Sexually Abused: No   Family History  Problem Relation Age of Onset   Hypertension Mother    Diabetes Mother    BP 98/64   Pulse (!) 101   Wt (!) 151.9 kg (334 lb 12.8 oz)   SpO2 96%   BMI 46.70 kg/m   Wt Readings from Last 3 Encounters:  11/16/23 (!) 151.9 kg (334 lb 12.8 oz)  11/13/23 (!) 152.2 kg (335 lb 9.6 oz)  09/12/23 (!) 151.5 kg (334 lb)   PHYSICAL EXAM: General:  Fatigued appearing. Comfortable at rest.  Neck: JVD difficult d/t thick neck. Cor: Regular rhythm. No rubs, gallops or murmurs. Lungs: diminished, no distress on supplemental O2 Abdomen: obese, soft, nontender, nondistended.  Extremities: no cyanosis, clubbing, rash, 1+ edema Neuro: alert & orientedx3. Affect anxious, fidgeting.   ASSESSMENT & PLAN: 1.  Acute on chronic diastolic CHF: Echo 5/23 showed EF 60-65%, moderate LVH, mid-cavity LV gradient to 29 mmHg, RV normal.  Suspect LVH is due to long-standing HTN.  - Echo 9/24: EF 70-75%, LV with no RWMA, severe concentric LVH, GIDD, RV normal - Stable NYHA class IIIb symptoms. Volume difficult on exam but seems to be at least mildly volume up. Continue 80 mg Torsemide BID (has not taken today). Given 2.5 mg metolazone in clinic. Instructed to take his am dose of Torsemide right when he gets home. ? Compliance with medications. Continue KCL 100/80 KCL daily. BMET/BNP today.  - Continue Jardiance 10 mg daily. No GU symptoms. - Continue spironolactone 50 mg daily.  - Continue losartan 50 mg daily. - Continue hydralazine 50 mg tid. - Cannot get Cardiomems as he has Medicaid.  - Suspect current exacerbation may have been triggered by cocaine use +/- adherence. Would be good for Paramedicine but not [facility].  2. Atrial fibrillation: Paroxysmal.   - Recent DCCV by EMS 09/01/23 for a fib RVR rate 200s - Back in Afib recently. He is in SR on ECG today. Cancel cardioversion. - Continue current dose of diltiazem.  - Continue Xarelto. No bleeding issues.  3. H/o PE: 5/20, continue Xarelto.   4. OHS/OSA: Uses O2 during the day and CPAP at night.   5. HTN: BP soft today  - Continue current regimen.  6. DM: Per PCP. Most recent hgb A1c 6.7 7 . Obesity: Body mass index is 46.7 kg/m.  - He has diabetes, has been referred to PharmD to see if we can get him semaglutide.   8. Cocaine: Still using. Reports last used 8-9 days ago, suspect within last 24-48 hrs. Declined UDS.  Follow up 1 week to assess volume.  Jerimie Mancuso N PA-C 11/16/2023

## 2023-11-16 NOTE — Telephone Encounter (Signed)
 Pharmacy Patient Advocate Encounter   Received notification from Pt Calls Messages that prior authorization for ozempic is required/requested.   Insurance verification completed.   The patient is insured through [facility] .   Per test claim: PA required; PA submitted to above mentioned insurance via CoverMyMeds Key/confirmation #/EOC [id number] Status is pending

## 2023-11-16 NOTE — Telephone Encounter (Signed)
 Pharmacy Patient Advocate Encounter  Received notification from [facility] that Prior Authorization for Benjamin [name] has been DENIED.  Full denial letter will be uploaded to the media tab. See denial reason below.   PA #/Case ID/Reference #: [id number]

## 2023-11-16 NOTE — Progress Notes (Deleted)
 Patient ID: Benjamin [name]                 DOB: 1966/09/02                    MRN: [id number]     HPI: Benjamin [name] is a 58 y.o. male patient referred to pharmacy clinic by Brynda Peon, NP to initiate GLP1-RA therapy. PMH is significant for diastolic CHF, HTN, T2DM, substance induced mood disorder,Alcohol abuse, Atrial fib, PE in April 2020 (on Xarelto), OSA, GERD, and obesity. Most recent BMI 46.81 kg/m and weight 335 lbs   Baseline weight and BMI: 342 lbs 47.83 kg/m  Current weight and BMI: 335 lbs 46.81 kg/m  Current meds that affect weight: none   Not on any antidiabetics that increases risk of hypoglycemia.    *** Follow-up visit  Assess % weight loss Assess adverse effects Missed doses  Diet:   Exercise:   Family History:  Relation Problem Comments  Mother Diabetes   Hypertension       Social History:   Labs: Lab Results  Component Value Date   HGBA1C 6.7 (H) 05/25/2023    Wt Readings from Last 1 Encounters:  11/13/23 (!) 335 lb 9.6 oz (152.2 kg)    BP Readings from Last 1 Encounters:  11/13/23 116/80   Pulse Readings from Last 1 Encounters:  11/13/23 85       Component Value Date/Time   CHOL 101 07/18/2022 1045   CHOL 174 01/17/2020 1135   TRIG 36 07/18/2022 1045   HDL 45 07/18/2022 1045   HDL 47 01/17/2020 1135   CHOLHDL 2.2 07/18/2022 1045   VLDL 7 07/18/2022 1045   LDLCALC 49 07/18/2022 1045   LDLCALC 110 (H) 01/17/2020 1135    Past Medical History:  Diagnosis Date   A-fib (HCC)    Anemia 05/29/2019   CHF (congestive heart failure) (HCC)    Chronic respiratory failure (HCC)    Diabetes mellitus without complication (HCC)    Dyspnea    Elevated troponin 02/06/2019   Hypertension    Obesity    Pulmonary embolism (HCC)    Rectal bleeding 05/29/2019   Sleep apnea     Current Outpatient Medications on File Prior to Visit  Medication Sig Dispense Refill   albuterol (PROVENTIL) (2.5 MG/3ML) 0.083% nebulizer solution Take 3 mLs  (2.5 mg total) by nebulization every 4 (four) hours as needed for wheezing or shortness of breath. INHALE 1 VIAL BY NEBULIZATION EVERY 6 (SIX) HOURS AS NEEDED FOR WHEEZING OR SHORTNESS OF BREATH. Strength: (2.5 MG/3ML) 0.083% 150 mL 1   atorvastatin (LIPITOR) 40 MG tablet TAKE 1 TABLET (40 MG TOTAL) BY MOUTH AT BEDTIME. 90 tablet 3   Blood Glucose Monitoring Suppl (TRUE METRIX METER) w/Device KIT Use as instructed. Check blood glucose level by fingerstick twice per day. 1 kit 0   Blood Pressure Monitor DEVI Please provide patient with insurance approved blood pressure device with L-XL cuff. BMI 55 1 each 0   COMBIVENT RESPIMAT 20-100 MCG/ACT AERS respimat INHALE 1 PUFF INTO THE LUNGS EVERY 6 (SIX) HOURS. (Patient taking differently: Inhale 1 puff into the lungs every 6 (six) hours as needed for shortness of breath.) 4 g 5   diltiazem (CARDIZEM CD) 360 MG 24 hr capsule TAKE 1 CAPSULE (360 MG TOTAL) BY MOUTH DAILY. (NOON) 90 capsule 3   empagliflozin (JARDIANCE) 10 MG TABS tablet Take 1 tablet (10 mg total) by mouth daily. NEEDS FOLLOW UP  APPOINTMENT FOR MORE REFILLS 30 tablet 3   ferrous sulfate (FEROSUL) 325 (65 FE) MG tablet TAKE 1 TABLET (325 MG TOTAL) BY MOUTH DAILY AT NOON. 90 tablet 0   glucose blood (TRUE METRIX BLOOD GLUCOSE TEST) test strip Use as instructed. Check blood glucose level by fingerstick twice per day. 100 each 12   hydrALAZINE (APRESOLINE) 50 MG tablet TAKE 1 TABLET BY MOUTH 3 (THREE) TIMES DAILY. (NOON,EVENING,BEDTIME) 270 tablet 2   isosorbide mononitrate (IMDUR) 30 MG 24 hr tablet Take 1 tablet (30 mg total) by mouth daily. 30 tablet 0   losartan (COZAAR) 50 MG tablet TAKE 1 TABLET (50 MG TOTAL) BY MOUTH DAILY. (NOON) 90 tablet 3   magnesium oxide (MAG-OX) 400 (240 Mg) MG tablet TAKE 1 TABLET (400 MG TOTAL) BY MOUTH 2 (TWO) TIMES DAILY (NOON+BEDTIME) 180 tablet 3   metFORMIN (GLUCOPHAGE) 500 MG tablet TAKE 1 TABLET BY MOUTH 2 (TWO) TIMES DAILY WITH A MEAL(NOON+BEDTIME) 180 tablet  0   Misc. Devices MISC Please provide patient with XL sized insurance approved compression stockings. ICD 10 R60.0 and I50.32 (Patient not taking: Reported on 11/13/2023) 1 each 0   omeprazole (PRILOSEC) 20 MG capsule TAKE 1 CAPSULE 2 (TWO) TIMES DAILY BEFORE A MEAL. TAKE FIRST CAPSULE 30 MIN PRIOR TO EATING OR TAKING OTHER MEDICATIONS. (AM+BEDTIME) 180 capsule 1   OXYGEN Inhale 2-3 L into the lungs continuous.     potassium chloride SA (KLOR-CON M) 20 MEQ tablet Take 5 tablets (100 mEq total) by mouth every morning AND 4 tablets (80 mEq total) every evening. 270 tablet 6   rivaroxaban (XARELTO) 20 MG TABS tablet TAKE 1 TABLET (20 MG TOTAL) BY MOUTH DAILY WITH SUPPER (BEDTIME) 90 tablet 2   spironolactone (ALDACTONE) 50 MG tablet TAKE 1 TABLET (50 MG TOTAL) BY MOUTH DAILY. (NOON) 30 tablet 11   tamsulosin (FLOMAX) 0.4 MG CAPS capsule Take 0.4 mg by mouth daily after supper.     torsemide (DEMADEX) 20 MG tablet Take 4 tablets (80 mg total) by mouth 2 (two) times daily. 240 tablet 6   TRUEplus Lancets 28G MISC Use as instructed. Check blood glucose level by fingerstick twice per day. 200 each 3   No current facility-administered medications on file prior to visit.    No Known Allergies   Assessment/Plan:  1. Weight loss - Patient has not met goal of at least 5% of body weight loss with comprehensive lifestyle modifications alone in the past 3-6 months. Pharmacotherapy is appropriate to pursue as augmentation. Will start ***. Confirmed patient not ***pregnant and no personal or family history of medullary thyroid carcinoma (MTC) or Multiple Endocrine Neoplasia syndrome type 2 (MEN 2). Injection technique reviewed at today's visit.  Advised patient on common side effects including nausea, diarrhea, dyspepsia, decreased appetite, and fatigue. Counseled patient on reducing meal size and how to titrate medication to minimize side effects. Counseled patient to call if intolerable side effects or if  experiencing dehydration, abdominal pain, or dizziness. Patient will adhere to dietary modifications and will target at least 150 minutes of moderate intensity exercise weekly.   Follow up in 1 month via telephone for tolerability update and dose titration.

## 2023-11-17 ENCOUNTER — Ambulatory Visit: Payer: MEDICAID | Admitting: Nurse Practitioner

## 2023-11-17 ENCOUNTER — Other Ambulatory Visit (HOSPITAL_COMMUNITY): Payer: Self-pay

## 2023-11-17 NOTE — Telephone Encounter (Signed)
 Pharmacy Patient Advocate Encounter  Received notification from [facility] that Prior Authorization for Ozempic has been APPROVED from 11/16/23 to 11/15/24. Ran test claim, Copay is $4.00 one month. This test claim was processed through [facility]- copay amounts may vary at other pharmacies due to pharmacy/plan contracts, or as the patient moves through the different stages of their insurance plan.   PA #/Case ID/Reference #: [id number]

## 2023-11-20 ENCOUNTER — Telehealth (HOSPITAL_COMMUNITY): Payer: Self-pay | Admitting: *Deleted

## 2023-11-20 NOTE — Telephone Encounter (Signed)
 Called patient and informed him of following per Unice Cobble, PA:  "Scr up and BNP down. He was given metolazone today. Needs BMET at f/u next week. Please cancel cardioversion scheduled 11/29/23 as he was in sinus rhythm today"  Pt verbalized understanding of repeat labs next visit and that his cardioversion has been cancelled.

## 2023-11-21 ENCOUNTER — Telehealth (HOSPITAL_COMMUNITY): Payer: Self-pay

## 2023-11-21 ENCOUNTER — Other Ambulatory Visit: Payer: Self-pay | Admitting: Nurse Practitioner

## 2023-11-21 ENCOUNTER — Other Ambulatory Visit: Payer: Self-pay | Admitting: Family Medicine

## 2023-11-21 DIAGNOSIS — E1169 Type 2 diabetes mellitus with other specified complication: Secondary | ICD-10-CM

## 2023-11-21 DIAGNOSIS — D508 Other iron deficiency anemias: Secondary | ICD-10-CM

## 2023-11-21 NOTE — Telephone Encounter (Signed)
 Attempted to call patient to confirm appointment, unable to reach patient and unable to leave message.

## 2023-11-22 ENCOUNTER — Encounter (HOSPITAL_COMMUNITY): Payer: MEDICAID

## 2023-11-22 NOTE — Telephone Encounter (Signed)
 Called patient to reschedule missed appointment on 0228/2025. Unable to leave VM. VM full.

## 2023-11-24 NOTE — Telephone Encounter (Signed)
 Ozempic PA approved.

## 2023-11-27 ENCOUNTER — Other Ambulatory Visit (HOSPITAL_COMMUNITY): Payer: MEDICAID

## 2023-11-29 ENCOUNTER — Emergency Department (HOSPITAL_COMMUNITY): Payer: MEDICAID

## 2023-11-29 ENCOUNTER — Inpatient Hospital Stay (HOSPITAL_COMMUNITY)
Admission: EM | Admit: 2023-11-29 | Discharge: 2023-12-02 | DRG: 291 | Disposition: A | Discharge status: Home / Self Care | Payer: MEDICAID | Attending: Internal Medicine | Admitting: Internal Medicine

## 2023-11-29 ENCOUNTER — Other Ambulatory Visit: Payer: Self-pay

## 2023-11-29 ENCOUNTER — Ambulatory Visit (HOSPITAL_COMMUNITY): Admit: 2023-11-29 | Payer: MEDICAID | Admitting: Cardiology

## 2023-11-29 ENCOUNTER — Encounter (HOSPITAL_COMMUNITY): Payer: Self-pay

## 2023-11-29 DIAGNOSIS — I10 Essential (primary) hypertension: Secondary | ICD-10-CM | POA: Diagnosis present

## 2023-11-29 DIAGNOSIS — R079 Chest pain, unspecified: Secondary | ICD-10-CM

## 2023-11-29 DIAGNOSIS — E662 Morbid (severe) obesity with alveolar hypoventilation: Secondary | ICD-10-CM | POA: Diagnosis present

## 2023-11-29 DIAGNOSIS — N400 Enlarged prostate without lower urinary tract symptoms: Secondary | ICD-10-CM | POA: Diagnosis present

## 2023-11-29 DIAGNOSIS — J9611 Chronic respiratory failure with hypoxia: Secondary | ICD-10-CM | POA: Diagnosis present

## 2023-11-29 DIAGNOSIS — Z86711 Personal history of pulmonary embolism: Secondary | ICD-10-CM

## 2023-11-29 DIAGNOSIS — G4733 Obstructive sleep apnea (adult) (pediatric): Secondary | ICD-10-CM

## 2023-11-29 DIAGNOSIS — E1122 Type 2 diabetes mellitus with diabetic chronic kidney disease: Secondary | ICD-10-CM | POA: Diagnosis present

## 2023-11-29 DIAGNOSIS — Z9981 Dependence on supplemental oxygen: Secondary | ICD-10-CM

## 2023-11-29 DIAGNOSIS — E1169 Type 2 diabetes mellitus with other specified complication: Secondary | ICD-10-CM | POA: Diagnosis present

## 2023-11-29 DIAGNOSIS — N5089 Other specified disorders of the male genital organs: Secondary | ICD-10-CM | POA: Diagnosis present

## 2023-11-29 DIAGNOSIS — Z7901 Long term (current) use of anticoagulants: Secondary | ICD-10-CM

## 2023-11-29 DIAGNOSIS — K219 Gastro-esophageal reflux disease without esophagitis: Secondary | ICD-10-CM | POA: Diagnosis present

## 2023-11-29 DIAGNOSIS — Z8249 Family history of ischemic heart disease and other diseases of the circulatory system: Secondary | ICD-10-CM

## 2023-11-29 DIAGNOSIS — Z5982 Transportation insecurity: Secondary | ICD-10-CM

## 2023-11-29 DIAGNOSIS — I5033 Acute on chronic diastolic (congestive) heart failure: Secondary | ICD-10-CM | POA: Diagnosis present

## 2023-11-29 DIAGNOSIS — E785 Hyperlipidemia, unspecified: Secondary | ICD-10-CM | POA: Diagnosis present

## 2023-11-29 DIAGNOSIS — Z6841 Body Mass Index (BMI) 40.0 and over, adult: Secondary | ICD-10-CM

## 2023-11-29 DIAGNOSIS — F1410 Cocaine abuse, uncomplicated: Secondary | ICD-10-CM | POA: Diagnosis present

## 2023-11-29 DIAGNOSIS — Z79899 Other long term (current) drug therapy: Secondary | ICD-10-CM

## 2023-11-29 DIAGNOSIS — Z87891 Personal history of nicotine dependence: Secondary | ICD-10-CM

## 2023-11-29 DIAGNOSIS — Z7984 Long term (current) use of oral hypoglycemic drugs: Secondary | ICD-10-CM

## 2023-11-29 DIAGNOSIS — Z833 Family history of diabetes mellitus: Secondary | ICD-10-CM

## 2023-11-29 DIAGNOSIS — N1831 Chronic kidney disease, stage 3a: Secondary | ICD-10-CM | POA: Diagnosis present

## 2023-11-29 DIAGNOSIS — I480 Paroxysmal atrial fibrillation: Secondary | ICD-10-CM | POA: Diagnosis present

## 2023-11-29 DIAGNOSIS — I130 Hypertensive heart and chronic kidney disease with heart failure and stage 1 through stage 4 chronic kidney disease, or unspecified chronic kidney disease: Principal | ICD-10-CM | POA: Diagnosis present

## 2023-11-29 DIAGNOSIS — Z5986 Financial insecurity: Secondary | ICD-10-CM

## 2023-11-29 DIAGNOSIS — I2489 Other forms of acute ischemic heart disease: Secondary | ICD-10-CM | POA: Diagnosis present

## 2023-11-29 DIAGNOSIS — E876 Hypokalemia: Secondary | ICD-10-CM | POA: Diagnosis present

## 2023-11-29 DIAGNOSIS — I509 Heart failure, unspecified: Principal | ICD-10-CM

## 2023-11-29 LAB — CBC
HCT: 47.7 % (ref 39.0–52.0)
Hemoglobin: 14.8 g/dL (ref 13.0–17.0)
MCH: 24.5 pg — ABNORMAL LOW (ref 26.0–34.0)
MCHC: 31 g/dL (ref 30.0–36.0)
MCV: 79 fL — ABNORMAL LOW (ref 80.0–100.0)
Platelets: 407 10*3/uL — ABNORMAL HIGH (ref 150–400)
RBC: 6.04 MIL/uL — ABNORMAL HIGH (ref 4.22–5.81)
RDW: 15.6 % — ABNORMAL HIGH (ref 11.5–15.5)
WBC: 5.8 10*3/uL (ref 4.0–10.5)
nRBC: 0 % (ref 0.0–0.2)

## 2023-11-29 LAB — BASIC METABOLIC PANEL
Anion gap: 8 (ref 5–15)
BUN: 20 mg/dL (ref 6–20)
CO2: 29 mmol/L (ref 22–32)
Calcium: 9.3 mg/dL (ref 8.9–10.3)
Chloride: 98 mmol/L (ref 98–111)
Creatinine, Ser: 1.49 mg/dL — ABNORMAL HIGH (ref 0.61–1.24)
GFR, Estimated: 54 mL/min — ABNORMAL LOW (ref >60)
Glucose, Bld: 84 mg/dL (ref 70–99)
Potassium: 3.4 mmol/L — ABNORMAL LOW (ref 3.5–5.1)
Sodium: 135 mmol/L (ref 135–145)

## 2023-11-29 LAB — TROPONIN I (HIGH SENSITIVITY)
Troponin I (High Sensitivity): 31 ng/L — ABNORMAL HIGH (ref <18)
Troponin I (High Sensitivity): 24 ng/L — ABNORMAL HIGH (ref <18)

## 2023-11-29 LAB — BRAIN NATRIURETIC PEPTIDE: B Natriuretic Peptide: 112.9 pg/mL — ABNORMAL HIGH (ref 0.0–100.0)

## 2023-11-29 SURGERY — TRANSESOPHAGEAL ECHOCARDIOGRAM (TEE) (CATHLAB)
Anesthesia: Monitor Anesthesia Care

## 2023-11-29 MED ORDER — FUROSEMIDE 10 MG/ML IJ SOLN
80.0000 mg | Freq: Once | INTRAMUSCULAR | Status: AC
  Administered 2023-11-29: 80 mg via INTRAVENOUS
  Filled 2023-11-29: qty 8

## 2023-11-29 MED ORDER — POTASSIUM CHLORIDE CRYS ER 20 MEQ PO TBCR
40.0000 meq | EXTENDED_RELEASE_TABLET | Freq: Once | ORAL | Status: AC
  Administered 2023-11-29: 40 meq via ORAL
  Filled 2023-11-29: qty 2

## 2023-11-29 MED ORDER — MORPHINE SULFATE (PF) 4 MG/ML IV SOLN
4.0000 mg | Freq: Once | INTRAVENOUS | Status: AC
  Administered 2023-11-29: 4 mg via INTRAVENOUS
  Filled 2023-11-29: qty 1

## 2023-11-29 NOTE — ED Provider Triage Note (Signed)
 Emergency Medicine Provider Triage Evaluation Note  Benjamin [name] , a 58 y.o. male  was evaluated in triage.  Pt complains of chest pain.  Patient complains of central chest discomfort x 3 days.  Patient reports that if he " had let it go longer he would have A-fib and need to get shocked".  He is compliant with Xarelto as previously prescribed.  Pain is described as constant pressure.  Patient was asleep in his chair at time of my initial triage evaluation.  I had to wake him up for evaluation.  Review of Systems  Positive: Chest discomfort Negative: Fever, shortness of breath  Physical Exam  BP (!) 111/90 (BP Location: Right Arm)   Pulse 91   Temp 98 F (36.7 C) (Oral)   Resp 16   Ht 5\' 10"  (1.778 m)   Wt (!) 158.8 kg   SpO2 97%   BMI 50.22 kg/m  Gen:   Awake, no distress   Resp:  Normal effort  MSK:   Moves extremities without difficulty  Other:    Medical Decision Making  Medically screening exam initiated at 3:12 PM.  Appropriate orders placed.  Benjamin [name] was informed that the remainder of the evaluation will be completed by another provider, this initial triage assessment does not replace that evaluation, and the importance of remaining in the ED until their evaluation is complete.  Patient understands triage process and need to remain in the ED until completion of evaluation.   Wynetta Fines, MD 11/29/23 [phone]

## 2023-11-29 NOTE — ED Notes (Signed)
 Patient transported to X-ray

## 2023-11-29 NOTE — ED Provider Notes (Signed)
 [place] [facility]ERGENCY DEPARTMENT AT [facility] Provider Note   CSN: [id number] Arrival date & time: 11/29/23  1413     History {Add pertinent medical, surgical, social history, OB history to HPI:1} Chief Complaint  Patient presents with   Chest Pain    Benjamin [name] is a 58 y.o. male with past medical history of T2DM, PE, paroxysmal A-fib (on Xarelto), IDA, OSA on CPAP, HLD, GERD, CHF, polysubstance use, elevated troponins presents to emerged department for evaluation of shortness of breath, substernal chest pain, bilateral testicular swelling for the past 3 days.  He endorses that this feels similar to when he was in A-fib requiring cardioversion in the past.  He states that he typically knows when he is fluid overloaded due to bilateral testicular swelling.  He reports compliance with torsemide 80 mg.  He was scheduled to have TTE with cardiology today but it was canceled secondary to him seeking ED evaluation.  He denies nasal congestion, cough, fever, chills, known sick contacts.  Last admitted for HF exacerbation on 05/25/23   Chest Pain Associated symptoms: no abdominal pain, no cough, no dizziness, no fatigue, no fever, no headache, no nausea, no numbness, no palpitations, no shortness of breath, no vomiting and no weakness       Home Medications Prior to Admission medications   Medication Sig Start Date End Date Taking? Authorizing Provider  albuterol (PROVENTIL) (2.5 MG/3ML) 0.083% nebulizer solution Take 3 mLs (2.5 mg total) by nebulization every 4 (four) hours as needed for wheezing or shortness of breath. INHALE 1 VIAL BY NEBULIZATION EVERY 6 (SIX) HOURS AS NEEDED FOR WHEEZING OR SHORTNESS OF BREATH. Strength: (2.5 MG/3ML) 0.083% 08/11/23   Claiborne Rigg, NP  atorvastatin (LIPITOR) 40 MG tablet TAKE 1 TABLET (40 MG TOTAL) BY MOUTH AT BEDTIME. 04/12/23   Milford, Anderson Malta, FNP  Blood Glucose Monitoring Suppl (TRUE METRIX METER) w/Device KIT Use as instructed.  Check blood glucose level by fingerstick twice per day. 05/30/23   Claiborne Rigg, NP  Blood Pressure Monitor DEVI Please provide patient with insurance approved blood pressure device with L-XL cuff. BMI 55 11/13/23   Milford, Anderson Malta, FNP  COMBIVENT RESPIMAT 20-100 MCG/ACT AERS respimat INHALE 1 PUFF INTO THE LUNGS EVERY 6 (SIX) HOURS. Patient taking differently: Inhale 1 puff into the lungs every 6 (six) hours as needed for shortness of breath. 09/15/22   Coralyn Helling, MD  diltiazem (CARDIZEM CD) 360 MG 24 hr capsule TAKE 1 CAPSULE (360 MG TOTAL) BY MOUTH DAILY. (NOON) 10/24/23   Milford, Anderson Malta, FNP  empagliflozin (JARDIANCE) 10 MG TABS tablet Take 1 tablet (10 mg total) by mouth daily. NEEDS FOLLOW UP APPOINTMENT FOR MORE REFILLS 08/08/23   Jacklynn Ganong, FNP  FEROSUL 325 (65 Fe) MG tablet TAKE 1 TABLET (325 MG TOTAL) BY MOUTH DAILY AT NOON. 11/22/23   Claiborne Rigg, NP  glucose blood (TRUE METRIX BLOOD GLUCOSE TEST) test strip Use as instructed. Check blood glucose level by fingerstick twice per day. 05/30/23   Claiborne Rigg, NP  hydrALAZINE (APRESOLINE) 50 MG tablet TAKE 1 TABLET BY MOUTH 3 (THREE) TIMES DAILY. (NOON,EVENING,BEDTIME) 09/18/23   Milford, Anderson Malta, FNP  isosorbide mononitrate (IMDUR) 30 MG 24 hr tablet Take 1 tablet (30 mg total) by mouth daily. 05/28/23 11/15/24  Arrien, York Ram, MD  losartan (COZAAR) 50 MG tablet TAKE 1 TABLET (50 MG TOTAL) BY MOUTH DAILY. (NOON) 10/24/23   Milford, Anderson Malta, FNP  magnesium oxide (  MAG-OX) 400 (240 Mg) MG tablet TAKE 1 TABLET (400 MG TOTAL) BY MOUTH 2 (TWO) TIMES DAILY (NOON+BEDTIME) 09/18/23   Milford, Anderson Malta, FNP  metFORMIN (GLUCOPHAGE) 500 MG tablet TAKE 1 TABLET BY MOUTH 2 (TWO) TIMES DAILY WITH A MEAL(NOON+BEDTIME) 11/22/23   Claiborne Rigg, NP  Misc. Devices MISC Please provide patient with XL sized insurance approved compression stockings. ICD 10 R60.0 and I50.32 08/11/23   Claiborne Rigg, NP  omeprazole (PRILOSEC) 20  MG capsule TAKE 1 CAPSULE 2 (TWO) TIMES DAILY BEFORE A MEAL. TAKE FIRST CAPSULE 30 MIN PRIOR TO EATING OR TAKING OTHER MEDICATIONS. (AM+BEDTIME) 06/01/23   Claiborne Rigg, NP  OXYGEN Inhale 2-3 L into the lungs continuous.    [provider]  potassium chloride SA (KLOR-CON M) 20 MEQ tablet Take 5 tablets (100 mEq total) by mouth every morning AND 4 tablets (80 mEq total) every evening. 09/11/23   Alen Bleacher, NP  rivaroxaban (XARELTO) 20 MG TABS tablet TAKE 1 TABLET (20 MG TOTAL) BY MOUTH DAILY WITH SUPPER (BEDTIME) 10/25/23   Alen Bleacher, NP  spironolactone (ALDACTONE) 50 MG tablet TAKE 1 TABLET (50 MG TOTAL) BY MOUTH DAILY. (NOON) 10/24/23   Milford, Anderson Malta, FNP  tamsulosin (FLOMAX) 0.4 MG CAPS capsule Take 0.4 mg by mouth daily after supper.    [provider]  torsemide (DEMADEX) 20 MG tablet Take 4 tablets (80 mg total) by mouth 2 (two) times daily. 09/18/23   Jacklynn Ganong, FNP  TRUEplus Lancets 28G MISC Use as instructed. Check blood glucose level by fingerstick twice per day. 09/22/20   Claiborne Rigg, NP      Allergies    Patient has no known allergies.    Review of Systems   Review of Systems  Constitutional:  Negative for chills, fatigue and fever.  Respiratory:  Negative for cough, chest tightness, shortness of breath and wheezing.   Cardiovascular:  Positive for chest pain. Negative for palpitations.  Gastrointestinal:  Negative for abdominal pain, constipation, diarrhea, nausea and vomiting.  Neurological:  Negative for dizziness, seizures, weakness, light-headedness, numbness and headaches.    Physical Exam Updated Vital Signs BP 128/82   Pulse 75   Temp 99.1 F (37.3 C) (Oral)   Resp 15   Ht 5\' 10"  (1.778 m)   Wt (!) 158.8 kg   SpO2 100%   BMI 50.22 kg/m  Physical Exam Vitals and nursing note reviewed.  Constitutional:      General: He is not in acute distress.    Appearance: Normal appearance.  HENT:     Head: Normocephalic and  atraumatic.  Eyes:     Conjunctiva/sclera: Conjunctivae normal.  Neck:     Comments: Difficulty assessing for JVD d/t large neck Cardiovascular:     Rate and Rhythm: Normal rate.     Pulses:          Dorsalis pedis pulses are 2+ on the right side and 2+ on the left side.  Pulmonary:     Effort: Pulmonary effort is normal. No respiratory distress.     Breath sounds: Examination of the right-lower field reveals decreased breath sounds. Examination of the left-lower field reveals decreased breath sounds. Decreased breath sounds present.  Abdominal:     Tenderness: There is no right CVA tenderness, left CVA tenderness, guarding or rebound.     Comments: Mildly distended without TTP. Also difficult to assess d/t body habitus  Musculoskeletal:     Right lower leg: No  tenderness. No edema.     Left lower leg: No tenderness. No edema.  Skin:    General: Skin is warm.     Capillary Refill: Capillary refill takes less than 2 seconds.     Coloration: Skin is not jaundiced or pale.  Neurological:     Mental Status: He is alert and oriented to person, place, and time. Mental status is at baseline.     ED Results / Procedures / Treatments   Labs (all labs ordered are listed, but only abnormal results are displayed) Labs Reviewed  BASIC METABOLIC PANEL - Abnormal; Notable for the following components:      Result Value   Potassium 3.4 (*)    Creatinine, Ser 1.49 (*)    GFR, Estimated 54 (*)    All other components within normal limits  CBC - Abnormal; Notable for the following components:   RBC 6.04 (*)    MCV 79.0 (*)    MCH 24.5 (*)    RDW 15.6 (*)    Platelets 407 (*)    All other components within normal limits  BRAIN NATRIURETIC PEPTIDE - Abnormal; Notable for the following components:   B Natriuretic Peptide 112.9 (*)    All other components within normal limits  TROPONIN I (HIGH SENSITIVITY) - Abnormal; Notable for the following components:   Troponin I (High Sensitivity) 31  (*)    All other components within normal limits  TROPONIN I (HIGH SENSITIVITY) - Abnormal; Notable for the following components:   Troponin I (High Sensitivity) 24 (*)    All other components within normal limits    EKG None  Radiology DG Chest 2 View Result Date: 11/29/2023 CLINICAL DATA:  Chest pain EXAM: CHEST - 2 VIEW COMPARISON:  Chest radiograph 05/25/2023 FINDINGS: Stable cardiomegaly. Basilar heterogeneous opacities. More focal nodular consolidation right mid lung. No pleural effusion or pneumothorax. Thoracic spine degenerative changes. IMPRESSION: 1. Basilar heterogeneous opacities may represent atelectasis or infection. 2. More focal nodular consolidation right mid lung. Recommend follow-up chest radiograph 4-6 weeks. Electronically Signed   By: Annia Belt M.D.   On: 11/29/2023 17:25    Procedures Procedures  {Document cardiac monitor, telemetry assessment procedure when appropriate:1}  Medications Ordered in ED Medications  potassium chloride SA (KLOR-CON M) CR tablet 40 mEq (40 mEq Oral Given 11/29/23 1934)  furosemide (LASIX) injection 80 mg (80 mg Intravenous Given 11/29/23 1935)  morphine (PF) 4 MG/ML injection 4 mg (4 mg Intravenous Given 11/29/23 2110)    ED Course/ Medical Decision Making/ A&P   {   Click here for ABCD2, HEART and other calculatorsREFRESH Note before signing :1}                              Medical Decision Making Amount and/or Complexity of Data Reviewed Labs: ordered. Radiology: ordered.  Risk Prescription drug management.    Patient presents to the ED for concern of SHOB, CP, this involves an extensive number of treatment options, and is a complaint that carries with it a high risk of complications and morbidity.  The differential diagnosis includes COVID, flu, RSV, pneumonia, fluid overload, ACS, hypoxia   Co morbidities that complicate the patient evaluation  BMI 50 CHF Chronic torsemide use See HPI   Additional history  obtained:  Additional history obtained from Nursing and Outside Medical Records   External records from outside source obtained and reviewed including  Triage RN note Most recent cardiology  note on 11/16/2023 Echo from 05/26/2023 EF 70 to 75%   Lab Tests:  I Ordered, and personally interpreted labs.  The pertinent results include:   Flat troponins of 31 and then 24 WBC WNL Potassium 3.4 Creatinine 1.49 (baseline has been 1.75-2.53 over the past 2 months) BNP 112   Imaging Studies ordered:  I ordered imaging studies including CXR  I independently visualized and interpreted imaging which showed  Basilar heterogeneous opacities may represent atelectasis or infection. More focal nodular consolidation right mid lung I agree with the radiologist interpretation   Cardiac Monitoring:  The patient was maintained on a cardiac monitor.  I personally viewed and interpreted the cardiac monitored which showed an underlying rhythm of: NSR at 87 with no STE nor ischemic changes   Medicines ordered and prescription drug management:  I ordered medication including lasix  for fluid overload  Reevaluation of the patient after these medicines showed that the patient improved I have reviewed the patients home medicines and have made adjustments as needed    Consultations Obtained:  I requested consultation with hospitalist,  and discussed lab and imaging findings as well as pertinent plan - they recommend: ***   Problem List / ED Course:  CP Acute on chronic HF Maintaining O2 sats. Mild difficulty speaking in full and complete sentences. Typcially is able to speak a few words at a time Difficulty PE 2/2 body habitus but appears to be fluid overloaded with testicular swelling and XR showing bibasilar opacities and right sided nodular consolidation Do not feel that patient has an infection as he has no infectious symptoms, fever Take torsemide 80mg  BNP 112. Typically patient's BNP is  WNL and could be related to body habitus   Reevaluation:  After the interventions noted above, I reevaluated the patient and found that they have :stayed the same   Social Determinants of Health:  Followed by [facility]   Dispostion:  After consideration of the diagnostic results and the patients response to treatment, I feel that the patent would benefit from admission for IV diuresis.    Final Clinical Impression(s) / ED Diagnoses Final diagnoses:  Chest pain, unspecified type  Acute on chronic heart failure, unspecified heart failure type (HCC)    Rx / DC Orders ED Discharge Orders     None

## 2023-11-29 NOTE — ED Triage Notes (Signed)
 Patient with central chest pressure x 3 days. One month ago patient cardioverted d/t being in afib rvr and feels like he did preceding that episode. Patient on Xarelto so ASA not administered but had no relief with SL nitroglycerin. Wears 3-4 L O2 at home.

## 2023-11-30 ENCOUNTER — Encounter (HOSPITAL_COMMUNITY): Payer: Self-pay | Admitting: Internal Medicine

## 2023-11-30 DIAGNOSIS — K219 Gastro-esophageal reflux disease without esophagitis: Secondary | ICD-10-CM | POA: Diagnosis present

## 2023-11-30 DIAGNOSIS — I5033 Acute on chronic diastolic (congestive) heart failure: Secondary | ICD-10-CM | POA: Diagnosis present

## 2023-11-30 DIAGNOSIS — F1410 Cocaine abuse, uncomplicated: Secondary | ICD-10-CM | POA: Diagnosis present

## 2023-11-30 DIAGNOSIS — Z7984 Long term (current) use of oral hypoglycemic drugs: Secondary | ICD-10-CM | POA: Diagnosis not present

## 2023-11-30 DIAGNOSIS — I130 Hypertensive heart and chronic kidney disease with heart failure and stage 1 through stage 4 chronic kidney disease, or unspecified chronic kidney disease: Secondary | ICD-10-CM | POA: Diagnosis present

## 2023-11-30 DIAGNOSIS — Z86711 Personal history of pulmonary embolism: Secondary | ICD-10-CM | POA: Diagnosis not present

## 2023-11-30 DIAGNOSIS — Z6841 Body Mass Index (BMI) 40.0 and over, adult: Secondary | ICD-10-CM | POA: Diagnosis not present

## 2023-11-30 DIAGNOSIS — N1831 Chronic kidney disease, stage 3a: Secondary | ICD-10-CM | POA: Diagnosis present

## 2023-11-30 DIAGNOSIS — Z9981 Dependence on supplemental oxygen: Secondary | ICD-10-CM | POA: Diagnosis not present

## 2023-11-30 DIAGNOSIS — N400 Enlarged prostate without lower urinary tract symptoms: Secondary | ICD-10-CM | POA: Diagnosis present

## 2023-11-30 DIAGNOSIS — I509 Heart failure, unspecified: Secondary | ICD-10-CM | POA: Diagnosis present

## 2023-11-30 DIAGNOSIS — Z87891 Personal history of nicotine dependence: Secondary | ICD-10-CM | POA: Diagnosis not present

## 2023-11-30 DIAGNOSIS — E1122 Type 2 diabetes mellitus with diabetic chronic kidney disease: Secondary | ICD-10-CM | POA: Diagnosis present

## 2023-11-30 DIAGNOSIS — J9611 Chronic respiratory failure with hypoxia: Secondary | ICD-10-CM | POA: Diagnosis present

## 2023-11-30 DIAGNOSIS — Z833 Family history of diabetes mellitus: Secondary | ICD-10-CM | POA: Diagnosis not present

## 2023-11-30 DIAGNOSIS — Z5982 Transportation insecurity: Secondary | ICD-10-CM | POA: Diagnosis not present

## 2023-11-30 DIAGNOSIS — Z5986 Financial insecurity: Secondary | ICD-10-CM | POA: Diagnosis not present

## 2023-11-30 DIAGNOSIS — Z7901 Long term (current) use of anticoagulants: Secondary | ICD-10-CM | POA: Diagnosis not present

## 2023-11-30 DIAGNOSIS — E662 Morbid (severe) obesity with alveolar hypoventilation: Secondary | ICD-10-CM | POA: Diagnosis present

## 2023-11-30 DIAGNOSIS — I2489 Other forms of acute ischemic heart disease: Secondary | ICD-10-CM | POA: Diagnosis present

## 2023-11-30 DIAGNOSIS — Z8249 Family history of ischemic heart disease and other diseases of the circulatory system: Secondary | ICD-10-CM | POA: Diagnosis not present

## 2023-11-30 DIAGNOSIS — Z79899 Other long term (current) drug therapy: Secondary | ICD-10-CM | POA: Diagnosis not present

## 2023-11-30 DIAGNOSIS — E785 Hyperlipidemia, unspecified: Secondary | ICD-10-CM | POA: Diagnosis present

## 2023-11-30 DIAGNOSIS — E876 Hypokalemia: Secondary | ICD-10-CM | POA: Diagnosis present

## 2023-11-30 DIAGNOSIS — I480 Paroxysmal atrial fibrillation: Secondary | ICD-10-CM | POA: Diagnosis present

## 2023-11-30 LAB — BASIC METABOLIC PANEL
Anion gap: 4 — ABNORMAL LOW (ref 5–15)
BUN: 19 mg/dL (ref 6–20)
CO2: 30 mmol/L (ref 22–32)
Calcium: 8.7 mg/dL — ABNORMAL LOW (ref 8.9–10.3)
Chloride: 102 mmol/L (ref 98–111)
Creatinine, Ser: 1.46 mg/dL — ABNORMAL HIGH (ref 0.61–1.24)
GFR, Estimated: 55 mL/min — ABNORMAL LOW (ref >60)
Glucose, Bld: 130 mg/dL — ABNORMAL HIGH (ref 70–99)
Potassium: 3.9 mmol/L (ref 3.5–5.1)
Sodium: 136 mmol/L (ref 135–145)

## 2023-11-30 LAB — CBC
HCT: 47.4 % (ref 39.0–52.0)
Hemoglobin: 14.8 g/dL (ref 13.0–17.0)
MCH: 24.9 pg — ABNORMAL LOW (ref 26.0–34.0)
MCHC: 31.2 g/dL (ref 30.0–36.0)
MCV: 79.7 fL — ABNORMAL LOW (ref 80.0–100.0)
Platelets: 376 10*3/uL (ref 150–400)
RBC: 5.95 MIL/uL — ABNORMAL HIGH (ref 4.22–5.81)
RDW: 15.5 % (ref 11.5–15.5)
WBC: 6.1 10*3/uL (ref 4.0–10.5)
nRBC: 0 % (ref 0.0–0.2)

## 2023-11-30 MED ORDER — POTASSIUM CHLORIDE 20 MEQ PO PACK
40.0000 meq | PACK | Freq: Once | ORAL | Status: AC
  Administered 2023-11-30: 40 meq via ORAL
  Filled 2023-11-30: qty 2

## 2023-11-30 MED ORDER — ONDANSETRON HCL 4 MG PO TABS: 4.0000 mg | ORAL_TABLET | Freq: Four times a day (QID) | ORAL | Status: DC | PRN

## 2023-11-30 MED ORDER — ATORVASTATIN CALCIUM 40 MG PO TABS
40.0000 mg | ORAL_TABLET | Freq: Every day | ORAL | Status: DC
  Administered 2023-11-30 – 2023-12-01 (×3): 40 mg via ORAL
  Filled 2023-11-30 (×3): qty 1

## 2023-11-30 MED ORDER — ACETAMINOPHEN 325 MG PO TABS: 650.0000 mg | ORAL_TABLET | Freq: Four times a day (QID) | ORAL | Status: DC | PRN

## 2023-11-30 MED ORDER — POLYVINYL ALCOHOL 1.4 % OP SOLN
1.0000 [drp] | OPHTHALMIC | Status: DC | PRN
Start: 2023-11-30 — End: 2023-12-02
  Filled 2023-11-30: qty 15

## 2023-11-30 MED ORDER — FUROSEMIDE 10 MG/ML IJ SOLN
80.0000 mg | Freq: Four times a day (QID) | INTRAMUSCULAR | Status: AC
  Administered 2023-11-30 (×3): 80 mg via INTRAVENOUS
  Filled 2023-11-30 (×3): qty 8

## 2023-11-30 MED ORDER — HYDRALAZINE HCL 50 MG PO TABS
50.0000 mg | ORAL_TABLET | Freq: Three times a day (TID) | ORAL | Status: DC
  Administered 2023-11-30 – 2023-12-02 (×7): 50 mg via ORAL
  Filled 2023-11-30 (×2): qty 1
  Filled 2023-11-30: qty 2
  Filled 2023-11-30 (×2): qty 1
  Filled 2023-11-30: qty 2
  Filled 2023-11-30: qty 1

## 2023-11-30 MED ORDER — ACETAMINOPHEN 650 MG RE SUPP: 650.0000 mg | Freq: Four times a day (QID) | RECTAL | Status: DC | PRN

## 2023-11-30 MED ORDER — RIVAROXABAN 20 MG PO TABS
20.0000 mg | ORAL_TABLET | Freq: Every day | ORAL | Status: DC
  Administered 2023-11-30 – 2023-12-02 (×3): 20 mg via ORAL
  Filled 2023-11-30: qty 1
  Filled 2023-11-30: qty 2
  Filled 2023-11-30: qty 1

## 2023-11-30 MED ORDER — DILTIAZEM HCL ER COATED BEADS 180 MG PO CP24
360.0000 mg | ORAL_CAPSULE | Freq: Every day | ORAL | Status: DC
  Administered 2023-11-30 – 2023-12-02 (×3): 360 mg via ORAL
  Filled 2023-11-30 (×3): qty 2

## 2023-11-30 MED ORDER — SPIRONOLACTONE 25 MG PO TABS
50.0000 mg | ORAL_TABLET | Freq: Every day | ORAL | Status: DC
Start: 2023-11-30 — End: 2023-12-02
  Administered 2023-11-30 – 2023-12-02 (×3): 50 mg via ORAL
  Filled 2023-11-30 (×3): qty 2

## 2023-11-30 MED ORDER — ONDANSETRON HCL 4 MG/2ML IJ SOLN
4.0000 mg | Freq: Four times a day (QID) | INTRAMUSCULAR | Status: DC | PRN
Start: 2023-11-30 — End: 2023-12-02

## 2023-11-30 MED ORDER — FUROSEMIDE 10 MG/ML IJ SOLN
40.0000 mg | Freq: Two times a day (BID) | INTRAMUSCULAR | Status: DC
  Administered 2023-12-01: 40 mg via INTRAVENOUS
  Filled 2023-11-30: qty 4

## 2023-11-30 MED ORDER — LOSARTAN POTASSIUM 50 MG PO TABS
50.0000 mg | ORAL_TABLET | Freq: Every day | ORAL | Status: DC
  Administered 2023-11-30 – 2023-12-02 (×3): 50 mg via ORAL
  Filled 2023-11-30 (×3): qty 1

## 2023-11-30 MED ORDER — ISOSORBIDE MONONITRATE ER 30 MG PO TB24: 30.0000 mg | ORAL_TABLET | Freq: Every day | ORAL | Status: DC

## 2023-11-30 MED ORDER — PANTOPRAZOLE SODIUM 40 MG PO TBEC
40.0000 mg | DELAYED_RELEASE_TABLET | Freq: Every day | ORAL | Status: DC
  Administered 2023-11-30 – 2023-12-02 (×3): 40 mg via ORAL
  Filled 2023-11-30 (×3): qty 1

## 2023-11-30 MED ORDER — TAMSULOSIN HCL 0.4 MG PO CAPS
0.4000 mg | ORAL_CAPSULE | Freq: Every day | ORAL | Status: DC
  Administered 2023-11-30 – 2023-12-01 (×2): 0.4 mg via ORAL
  Filled 2023-11-30 (×2): qty 1

## 2023-11-30 NOTE — Progress Notes (Signed)
 Nursing reached out to relay that patient had not yet gotten 3rd dose of 80mg  IV Lasix this evening due to timing of admission, requested to re-time to now so that patient can complete 3rd dose today. Reviewed chart. Agree with plan.

## 2023-11-30 NOTE — Progress Notes (Signed)
 H&V Care Navigation CSW Progress Note  Outpatient HF CSW met with pt at bedside to check in.  Patient feeling poorly at this time but relieved to be getting fluid off of him.  Request CSW assistance with calling parole officer to inform of inpatient stay and calling management company to inform as he was supposed to provide payment for maintenance fee for them tomorrow.  CSW called both and updated on patient status.  No further needs at this time- will continue to follow in outpatient clinic when [facility].   SDOH Screenings   Food Insecurity: No Food Insecurity (11/30/2023)  Housing: Low Risk  (11/30/2023)  Transportation Needs: No Transportation Needs (11/30/2023)  Recent Concern: Transportation Needs - Unmet Transportation Needs (11/13/2023)  Utilities: Not At Risk (11/30/2023)  Depression (PHQ2-9): Low Risk  (08/11/2023)  Financial Resource Strain: High Risk (02/27/2023)  Social Connections: Unknown (03/12/2023)   Received from [facility], [facility]  Tobacco Use: Medium Risk (11/30/2023)   [name], LCSW Clinical Social Worker [facility] Desk#: [phone] Cell#: [phone]

## 2023-11-30 NOTE — Consult Note (Signed)
 Cardiology Consultation   Patient ID: Benjamin [name] MRN: [id number]; DOB: 1966-01-19  Admit date: 11/29/2023 Date of Consult: 11/30/2023  PCP:  Claiborne Rigg, NP   [facility] Providers Cardiologist:  Dietrich Pates, MD     Patient Profile:   Benjamin [name] is a 58 y.o. male with a hx of status, hypertension, diabetes, atrial fibrillation, PE '20, OSA, GERD who is being seen 11/30/2023 for the evaluation of chest pain at the request of Dr. Ophelia Charter.  History of Present Illness:   Benjamin [name] 58 year old male with past medical history noted above.  He has been followed by Dr. Tenny Craw as an outpatient as well as Dr. Shirlee Latch.  Admitted 01/2019 with increased dyspnea and found to have PE.  He was placed on Xarelto.  Readmitted shortly thereafter for syncope, cough and dyspnea and found to be COVID-positive.  Readmitted 02/2019 with bright red blood per rectum with GI eval done.  Underwent EGD/colonoscopy diagnosed with hemorrhoidal bleeding.  Admitted 03/2019 with lower extremity edema, required Lasix drip and was transitioned to torsemide 40 mg twice a day.  Did undergo right heart cath with mild volume overload but preserved cardiac output.  Discharge weight at that time was 407 pounds  Admitted with symptomatic anemia 05/2019 with a hemoglobin of 7.1.  Received 1 unit PRBCs.  Seen in the clinic 07/2019 and felt poorly at that visit.  Complained of orthopnea and PND with significant weight gain of 20 pounds with his weight being 428 pounds.  Exam was notable for abdominal distention and lower extremity edema.  He was admitted and diuresed with IV Lasix.  Transition to torsemide 100 mg twice daily at discharge.  Admitted 08/2020 with atrial fibrillation with RVR in the setting of noncompliance, EtOH and cocaine use.  Underwent successful TEE cardioversion.  Seen in the ED 08/2023 with A-fib RVR and required cardioversion via EMS.  He was discharged the same day.  Last seen in the  clinic on 11/13/2023 for follow-up.  Reported shortness of breath with ambulation and felt bloated with occasional palpitations.  Stated he had last used cocaine 40 days ago, no EtOH or tobacco use.  He remained on 3 L nasal cannula continuously and used his CPAP at night.  This visit he was noted to be in atrial fibrillation with RVR though asymptomatic.  Reported he had missed several doses of his Xarelto therefore it was recommended that he undergo TEE/DCCV.  Continued on torsemide 80 mg twice daily, Jardiance 10 mg daily, spironolactone 50 mg daily, losartan 50 mg daily, hydralazine 50 mg 3 times daily, Imdur 30 mg daily.  Presented to the ED on 3/12 with complaints of shortness of breath, chest heaviness and swelling. Reports he felt palpitations, and developed worsening shortness of breath over the past several days prior to admission. Says he has been compliant with his medications. He does not weigh himself on a regular basis. He was concerned that his atrial fibrillation may have been getting worse and with his worsening breathing he presented to the ED.   Labs in the ED showed sodium 135, potassium 3.4, creatinine 1.4, BNP 112, high-sensitivity troponin 31>>24, WBC 5.8, hemoglobin 14.8.  Chest x-ray with bilateral atelectasis.  EKG shows sinus rhythm, 87 bpm, nonspecific changes. He was admitted to IM service and cardiology asked to evaluate.   In talking with him, he reports a heaviness in his chest and ongoing shortness of breath. Says he feels this way when his afib "acts up"  and he doesn't want to have to be shocked again. Denies any recent cocaine use, but has not fully abstained.    Past Medical History:  Diagnosis Date   A-fib (HCC)    Anemia 05/29/2019   CHF (congestive heart failure) (HCC)    Chronic respiratory failure (HCC)    Diabetes mellitus without complication (HCC)    Dyspnea    Elevated troponin 02/06/2019   Hypertension    Obesity    Pulmonary embolism (HCC)    Rectal  bleeding 05/29/2019   Sleep apnea     Past Surgical History:  Procedure Laterality Date   CARDIOVERSION N/A 08/21/2020   Procedure: CARDIOVERSION;  Surgeon: Laurey Morale, MD;  Location: [facility] ENDOSCOPY;  Service: Cardiovascular;  Laterality: N/A;   COLONOSCOPY WITH PROPOFOL Left 02/25/2019   Procedure: COLONOSCOPY WITH PROPOFOL;  Surgeon: [name], MD;  Location: [facility] ENDOSCOPY;  Service: Endoscopy;  Laterality: Left;   ESOPHAGOGASTRODUODENOSCOPY (EGD) WITH PROPOFOL Left 02/25/2019   Procedure: ESOPHAGOGASTRODUODENOSCOPY (EGD) WITH PROPOFOL;  Surgeon: [name], MD;  Location: [facility] ENDOSCOPY;  Service: Endoscopy;  Laterality: Left;   ESOPHAGOGASTRODUODENOSCOPY (EGD) WITH PROPOFOL N/A 06/12/2019   Procedure: ESOPHAGOGASTRODUODENOSCOPY (EGD) WITH PROPOFOL;  Surgeon: Vida Rigger, MD;  Location: [facility] ENDOSCOPY;  Service: Gastroenterology;  Laterality: N/A;   FLEXIBLE SIGMOIDOSCOPY N/A 06/12/2019   Procedure: FLEXIBLE SIGMOIDOSCOPY;  Surgeon: Vida Rigger, MD;  Location: [facility] ENDOSCOPY;  Service: Gastroenterology;  Laterality: N/A;   HEMORRHOID BANDING  05/2019   NO PAST SURGERIES     RIGHT HEART CATH N/A 04/17/2019   Procedure: RIGHT HEART CATH;  Surgeon: Laurey Morale, MD;  Location: [facility] INVASIVE CV LAB;  Service: Cardiovascular;  Laterality: N/A;   TEE WITHOUT CARDIOVERSION N/A 08/21/2020   Procedure: TRANSESOPHAGEAL ECHOCARDIOGRAM (TEE);  Surgeon: Laurey Morale, MD;  Location: [facility] ENDOSCOPY;  Service: Cardiovascular;  Laterality: N/A;    Inpatient Medications: Scheduled Meds:  atorvastatin  40 mg Oral QHS   diltiazem  360 mg Oral Daily   furosemide  80 mg Intravenous Q6H   hydrALAZINE  50 mg Oral Q8H   losartan  50 mg Oral Daily   pantoprazole  40 mg Oral Daily   rivaroxaban  20 mg Oral Daily   spironolactone  50 mg Oral Daily   tamsulosin  0.4 mg Oral QPC supper   Continuous Infusions:  PRN Meds: acetaminophen **OR** acetaminophen, ondansetron **OR** ondansetron (ZOFRAN)  IV  Allergies:   No Known Allergies  Social History:   Social History   Socioeconomic History   Marital status: Single    Spouse name: Not on file   Number of children: Not on file   Years of education: Not on file   Highest education level: Not on file  Occupational History   Not on file  Tobacco Use   Smoking status: Former   Smokeless tobacco: Never   Tobacco comments:    smoked weed in the past  Vaping Use   Vaping status: Never Used  Substance and Sexual Activity   Alcohol use: Not Currently   Drug use: Yes    Types: Marijuana, Cocaine   Sexual activity: Not Currently  Other Topics Concern   Not on file  Social History Narrative   Not on file   Social Drivers of Health   Financial Resource Strain: High Risk (02/27/2023)   Overall Financial Resource Strain (CARDIA)    Difficulty of Paying Living Expenses: Hard  Food Insecurity: No Food Insecurity (11/30/2023)   Hunger Vital Sign  Worried About Programme researcher, broadcasting/film/video in the Last Year: Never true    Ran Out of Food in the Last Year: Never true  Transportation Needs: No Transportation Needs (11/30/2023)   PRAPARE - Administrator, Civil Service (Medical): No    Lack of Transportation (Non-Medical): No  Recent Concern: Transportation Needs - Unmet Transportation Needs (11/13/2023)   PRAPARE - Administrator, Civil Service (Medical): Yes    Lack of Transportation (Non-Medical): No  Physical Activity: Not on file  Stress: Not on file  Social Connections: Unknown (03/12/2023)   Received [facility] [place], [facility]   Social Network    Social Network: Not on file  Intimate Partner Violence: Not At Risk (11/30/2023)   Humiliation, Afraid, Rape, and Kick questionnaire    Fear of Current or Ex-Partner: No    Emotionally Abused: No    Physically Abused: No    Sexually Abused: No    Family History:    Family History  Problem Relation Age of Onset   Hypertension Mother    Diabetes Mother       ROS:  Please see the history of present illness.   All other ROS reviewed and negative.     Physical Exam/Data:   Vitals:   11/30/23 1147 11/30/23 1200 11/30/23 1215 11/30/23 1230  BP:      Pulse:      Resp:  15 18 20   Temp: 99.1 F (37.3 C)     TempSrc: Oral     SpO2:      Weight:      Height:        Intake/Output Summary (Last 24 hours) at 11/30/2023 1253 Last data filed at 11/29/2023 2131 Gross per 24 hour  Intake --  Output 900 ml  Net -900 ml      11/29/2023    2:08 PM 11/16/2023    9:34 AM 11/13/2023   10:59 AM  Last 3 Weights  Weight (lbs) 350 lb 334 lb 12.8 oz 335 lb 9.6 oz  Weight (kg) 158.759 kg 151.864 kg 152.227 kg     Body mass index is 50.22 kg/m.  General:  Obese male, laying in bed, dyspneic during conversation. Benjamin [name]  HEENT: normal Neck: mildly elevated JVD Vascular: No carotid bruits; Distal pulses 2+ bilaterally Cardiac:  normal S1, S2; RRR; no murmur  Lungs:  diminished in lower lobes Abd: soft, nontender, no hepatomegaly  Ext: mild LE edema Musculoskeletal:  No deformities, BUE and BLE strength normal and equal Skin: warm and dry  Neuro:  CNs 2-12 intact, no focal abnormalities noted Psych:  Normal affect   EKG:  The EKG was personally reviewed and demonstrates:  sinus rhythm, 87 bpm  with nonspecific changes  Relevant CV Studies:  Echo: 05/2023  IMPRESSIONS     1. Left ventricular ejection fraction, by estimation, is 70 to 75%. Left  ventricular ejection fraction by 2D MOD biplane is 71.1 %. The left  ventricle has hyperdynamic function. The left ventricle has no regional  wall motion abnormalities. There is  severe concentric left ventricular hypertrophy. Left ventricular diastolic  parameters are consistent with Grade I diastolic dysfunction (impaired  relaxation).   2. Right ventricular systolic function is normal. The right ventricular  size is mildly enlarged. Tricuspid regurgitation signal is inadequate for  assessing PA  pressure.   3. Right atrial size was mildly dilated.   4. The mitral valve was not well visualized. No evidence of mitral valve  regurgitation.   5. The aortic valve was not well visualized. Aortic valve regurgitation  is not visualized. No aortic stenosis is present.   6. The inferior vena cava is normal in size with greater than 50%  respiratory variability, suggesting right atrial pressure of 3 mmHg.   Comparison(s): Changes from prior study are noted. 01/28/2022: LVEF 60-65%,  moderate LVH, grade 1 DD.   FINDINGS   Left Ventricle: Left ventricular ejection fraction, by estimation, is 70  to 75%. Left ventricular ejection fraction by 2D MOD biplane is 71.1 %.  The left ventricle has hyperdynamic function. The left ventricle has no  regional wall motion abnormalities.  Definity contrast agent was given IV to delineate the left ventricular  endocardial borders. The left ventricular internal cavity size was normal  in size. There is severe concentric left ventricular hypertrophy. Left  ventricular diastolic parameters are  consistent with Grade I diastolic dysfunction (impaired relaxation).  Indeterminate filling pressures.   Right Ventricle: The right ventricular size is mildly enlarged. No  increase in right ventricular wall thickness. Right ventricular systolic  function is normal. Tricuspid regurgitation signal is inadequate for  assessing PA pressure.   Left Atrium: Left atrial size was normal in size.   Right Atrium: Right atrial size was mildly dilated.   Pericardium: There is no evidence of pericardial effusion.   Mitral Valve: The mitral valve was not well visualized. No evidence of  mitral valve regurgitation. MV peak gradient, 3.6 mmHg. The mean mitral  valve gradient is 2.0 mmHg.   Tricuspid Valve: The tricuspid valve is not well visualized. Tricuspid  valve regurgitation is not demonstrated.   Aortic Valve: The aortic valve was not well visualized. Aortic valve   regurgitation is not visualized. No aortic stenosis is present.   Pulmonic Valve: The pulmonic valve was not well visualized. Pulmonic valve  regurgitation is not visualized.   Aorta: The aortic root is normal in size and structure.   Venous: The inferior vena cava is normal in size with greater than 50%  respiratory variability, suggesting right atrial pressure of 3 mmHg.   IAS/Shunts: No atrial level shunt detected by color flow Doppler.    Laboratory Data:  High Sensitivity Troponin:   Recent Labs  Lab 11/29/23 1415 11/29/23 1615  TROPONINIHS 31* 24*     Chemistry Recent Labs  Lab 11/29/23 1415 11/30/23 0526  NA 135 136  K 3.4* 3.9  CL 98 102  CO2 29 30  GLUCOSE 84 130*  BUN 20 19  CREATININE 1.49* 1.46*  CALCIUM 9.3 8.7*  GFRNONAA 54* 55*  ANIONGAP 8 4*    No results for input(s): "PROT", "ALBUMIN", "AST", "ALT", "ALKPHOS", "BILITOT" in the last 168 hours. Lipids No results for input(s): "CHOL", "TRIG", "HDL", "LABVLDL", "LDLCALC", "CHOLHDL" in the last 168 hours.  Hematology Recent Labs  Lab 11/29/23 1415 11/30/23 0526  WBC 5.8 6.1  RBC 6.04* 5.95*  HGB 14.8 14.8  HCT 47.7 47.4  MCV 79.0* 79.7*  MCH 24.5* 24.9*  MCHC 31.0 31.2  RDW 15.6* 15.5  PLT 407* 376   Thyroid No results for input(s): "TSH", "FREET4" in the last 168 hours.  BNP Recent Labs  Lab 11/29/23 1415  BNP 112.9*    DDimer No results for input(s): "DDIMER" in the last 168 hours.   Radiology/Studies:  DG Chest 2 View Result Date: 11/29/2023 CLINICAL DATA:  Chest pain EXAM: CHEST - 2 VIEW COMPARISON:  Chest radiograph 05/25/2023 FINDINGS: Stable cardiomegaly. Basilar heterogeneous opacities. More  focal nodular consolidation right mid lung. No pleural effusion or pneumothorax. Thoracic spine degenerative changes. IMPRESSION: 1. Basilar heterogeneous opacities may represent atelectasis or infection. 2. More focal nodular consolidation right mid lung. Recommend follow-up chest  radiograph 4-6 weeks. Electronically Signed   By: Annia Belt M.D.   On: 11/29/2023 17:25     Assessment and Plan:   Benjamin [name] is a 58 y.o. male with a hx of status, hypertension, diabetes, atrial fibrillation, PE '20, OSA, GERD who is being seen 11/30/2023 for the evaluation of chest pain at the request of Dr. Ophelia Charter.  Acute on Chronic HFpEF Acute on Chronic hypoxia -- echo 05/2023 with LVEF of 70-75%, no rWMA, severe LVH, g1DD, normal RV -- presents with worsening dyspnea and fluid overload. Suspect BNP falsely low in the setting of obesity -- CXR with pulmonary edema and appears volume overloaded on exam -- of note, recently seen in the office and noted to be in atrial fibrillation with RVR and planned for outpatient TEE/DCCV. Suspect elevated rates may have contributed to worsening HF symptoms along with obesity hypoventilation syndrome -- currently on IV lasix 80mg  TID with plans to transition to 40mg  IV BID tomorrow per primary. On torsemide 80mg  BID at home, follow UOP though may require higher doses of lasix -- on 3L [place] @ home, up to 4L currently -- needs updated weight  Paroxysmal atrial fibrillation -- currently in SR on admission, though recently in afib while in the office with RVR -- continue Cardizem 360mg  daily, Xarelto 20mg  daily  HTN -- history of poor control, but actually well controlled on admission -- on Cardizem, hydralazine, losartan, spiro PTA -- some soft readings, may need to pull back on meds pending follow up BPs  Hx of PE -- on Xarelto  OHS/OSA -- on CPap PTA  Obesity -- BMI 50.2  Polysubstance abuse -- hx of cocaine, ETOH use  Risk Assessment/Risk Scores:   New York Heart Association (NYHA) Functional Class NYHA Class III  CHA2DS2-VASc Score = 3   This indicates a 3.2% annual risk of stroke. The patient's score is based upon: CHF History: 1 HTN History: 1 Diabetes History: 1 Stroke History: 0 Vascular Disease History: 0 Age  Score: 0 Gender Score: 0   For questions or updates, please contact [facility] Please consult [email or web address] for contact info under    Signed, Laverda Page, NP  11/30/2023 12:53 PM

## 2023-11-30 NOTE — Plan of Care (Signed)
  Problem: Education: Goal: Knowledge of General Education information will improve Description: Including pain rating scale, medication(s)/side effects and non-pharmacologic comfort measures Outcome: Not Progressing   Newly admitted

## 2023-11-30 NOTE — H&P (Signed)
 History and Physical    AMIRR ACHORD ZOX:[id number] DOB: [birth year] DOA: 11/29/2023  PCP: Claiborne Rigg, NP   Chief Complaint:  chest heaviness fluid retention  HPI: Benjamin [name] is a 58 y.o. male with medical history significant of with history of A-fib on Xarelto, type 2 diabetes, PE on Xarelto, obstructive sleep apnea on CPAP, hyperlipidemia, polysubstance abuse Ouzts presented to the emergency department due to shortness of breath chest heaviness and swelling.  Patient has had A-fib in the past and has required cardioversion.  He has a known history of heart failure.  His last echocardiogram was performed in September which showed diastolic dysfunction.  He has been maintained on torsemide 80 mg.  He states that he has been compliant with his Eliquis however has had persistent weight gain and swelling.  Today he developed shortness of breath and orthopnea so he presented to the ER.  On arrival he was afebrile and hemodynamically stable.  Labs were obtained which showed potassium 3.4, creatinine 1.4 baseline, WBC 5.8, hemoglobin 14.8, troponin 31, 24, BNP 112.  Patient underwent chest x-ray which showed cardiomegaly and nodular consolidation of right middle lobe.  Patient was given IV Lasix admitted further workup.  On evaluation he was somnolent and had just woken up from a heavy nap.  He had severe sleep apnea.  He was placed on CPAP and blood gas was ordered   Review of Systems: Review of Systems  Constitutional:  Negative for chills and fever.  HENT: Negative.    Eyes: Negative.   Respiratory: Negative.    Cardiovascular:  Positive for chest pain and palpitations.  Gastrointestinal: Negative.   Genitourinary: Negative.   Musculoskeletal: Negative.   Skin: Negative.   Neurological: Negative.   Endo/Heme/Allergies: Negative.   Psychiatric/Behavioral: Negative.    All other systems reviewed and are negative.    As per HPI otherwise 10 point review of systems negative.    No Known Allergies  Past Medical History:  Diagnosis Date   A-fib (HCC)    Anemia 05/29/2019   CHF (congestive heart failure) (HCC)    Chronic respiratory failure (HCC)    Diabetes mellitus without complication (HCC)    Dyspnea    Elevated troponin 02/06/2019   Hypertension    Obesity    Pulmonary embolism (HCC)    Rectal bleeding 05/29/2019   Sleep apnea     Past Surgical History:  Procedure Laterality Date   CARDIOVERSION N/A 08/21/2020   Procedure: CARDIOVERSION;  Surgeon: Laurey Morale, MD;  Location: [facility] ENDOSCOPY;  Service: Cardiovascular;  Laterality: N/A;   COLONOSCOPY WITH PROPOFOL Left 02/25/2019   Procedure: COLONOSCOPY WITH PROPOFOL;  Surgeon: [name], MD;  Location: [facility] ENDOSCOPY;  Service: Endoscopy;  Laterality: Left;   ESOPHAGOGASTRODUODENOSCOPY (EGD) WITH PROPOFOL Left 02/25/2019   Procedure: ESOPHAGOGASTRODUODENOSCOPY (EGD) WITH PROPOFOL;  Surgeon: [name], MD;  Location: [facility] ENDOSCOPY;  Service: Endoscopy;  Laterality: Left;   ESOPHAGOGASTRODUODENOSCOPY (EGD) WITH PROPOFOL N/A 06/12/2019   Procedure: ESOPHAGOGASTRODUODENOSCOPY (EGD) WITH PROPOFOL;  Surgeon: Vida Rigger, MD;  Location: [facility] ENDOSCOPY;  Service: Gastroenterology;  Laterality: N/A;   FLEXIBLE SIGMOIDOSCOPY N/A 06/12/2019   Procedure: FLEXIBLE SIGMOIDOSCOPY;  Surgeon: Vida Rigger, MD;  Location: [facility] ENDOSCOPY;  Service: Gastroenterology;  Laterality: N/A;   HEMORRHOID BANDING  05/2019   NO PAST SURGERIES     RIGHT HEART CATH N/A 04/17/2019   Procedure: RIGHT HEART CATH;  Surgeon: Laurey Morale, MD;  Location: [facility] INVASIVE CV LAB;  Service: Cardiovascular;  Laterality: N/A;  TEE WITHOUT CARDIOVERSION N/A 08/21/2020   Procedure: TRANSESOPHAGEAL ECHOCARDIOGRAM (TEE);  Surgeon: Laurey Morale, MD;  Location: [facility] ENDOSCOPY;  Service: Cardiovascular;  Laterality: N/A;     reports that he has quit smoking. He has never used smokeless tobacco. He reports that he does not currently use alcohol.  He reports current drug use. Drugs: Marijuana and Cocaine.  Family History  Problem Relation Age of Onset   Hypertension Mother    Diabetes Mother     Prior to Admission medications   Medication Sig Start Date End Date Taking? Authorizing Provider  albuterol (PROVENTIL) (2.5 MG/3ML) 0.083% nebulizer solution Take 3 mLs (2.5 mg total) by nebulization every 4 (four) hours as needed for wheezing or shortness of breath. INHALE 1 VIAL BY NEBULIZATION EVERY 6 (SIX) HOURS AS NEEDED FOR WHEEZING OR SHORTNESS OF BREATH. Strength: (2.5 MG/3ML) 0.083% 08/11/23   Claiborne Rigg, NP  atorvastatin (LIPITOR) 40 MG tablet TAKE 1 TABLET (40 MG TOTAL) BY MOUTH AT BEDTIME. 04/12/23   Milford, Anderson Malta, FNP  Blood Glucose Monitoring Suppl (TRUE METRIX METER) w/Device KIT Use as instructed. Check blood glucose level by fingerstick twice per day. 05/30/23   Claiborne Rigg, NP  Blood Pressure Monitor DEVI Please provide patient with insurance approved blood pressure device with L-XL cuff. BMI 55 11/13/23   Milford, Anderson Malta, FNP  COMBIVENT RESPIMAT 20-100 MCG/ACT AERS respimat INHALE 1 PUFF INTO THE LUNGS EVERY 6 (SIX) HOURS. Patient taking differently: Inhale 1 puff into the lungs every 6 (six) hours as needed for shortness of breath. 09/15/22   Coralyn Helling, MD  diltiazem (CARDIZEM CD) 360 MG 24 hr capsule TAKE 1 CAPSULE (360 MG TOTAL) BY MOUTH DAILY. (NOON) 10/24/23   Milford, Anderson Malta, FNP  empagliflozin (JARDIANCE) 10 MG TABS tablet Take 1 tablet (10 mg total) by mouth daily. NEEDS FOLLOW UP APPOINTMENT FOR MORE REFILLS 08/08/23   Jacklynn Ganong, FNP  FEROSUL 325 (65 Fe) MG tablet TAKE 1 TABLET (325 MG TOTAL) BY MOUTH DAILY AT NOON. 11/22/23   Claiborne Rigg, NP  glucose blood (TRUE METRIX BLOOD GLUCOSE TEST) test strip Use as instructed. Check blood glucose level by fingerstick twice per day. 05/30/23   Claiborne Rigg, NP  hydrALAZINE (APRESOLINE) 50 MG tablet TAKE 1 TABLET BY MOUTH 3 (THREE) TIMES DAILY.  (NOON,EVENING,BEDTIME) 09/18/23   Milford, Anderson Malta, FNP  isosorbide mononitrate (IMDUR) 30 MG 24 hr tablet Take 1 tablet (30 mg total) by mouth daily. 05/28/23 11/15/24  Arrien, York Ram, MD  losartan (COZAAR) 50 MG tablet TAKE 1 TABLET (50 MG TOTAL) BY MOUTH DAILY. (NOON) 10/24/23   Milford, Anderson Malta, FNP  magnesium oxide (MAG-OX) 400 (240 Mg) MG tablet TAKE 1 TABLET (400 MG TOTAL) BY MOUTH 2 (TWO) TIMES DAILY (NOON+BEDTIME) 09/18/23   Milford, Anderson Malta, FNP  metFORMIN (GLUCOPHAGE) 500 MG tablet TAKE 1 TABLET BY MOUTH 2 (TWO) TIMES DAILY WITH A MEAL(NOON+BEDTIME) 11/22/23   Claiborne Rigg, NP  Misc. Devices MISC Please provide patient with XL sized insurance approved compression stockings. ICD 10 R60.0 and I50.32 08/11/23   Claiborne Rigg, NP  omeprazole (PRILOSEC) 20 MG capsule TAKE 1 CAPSULE 2 (TWO) TIMES DAILY BEFORE A MEAL. TAKE FIRST CAPSULE 30 MIN PRIOR TO EATING OR TAKING OTHER MEDICATIONS. (AM+BEDTIME) 06/01/23   Claiborne Rigg, NP  OXYGEN Inhale 2-3 L into the lungs continuous.    [provider]  potassium chloride SA (KLOR-CON M) 20 MEQ tablet Take 5 tablets (  100 mEq total) by mouth every morning AND 4 tablets (80 mEq total) every evening. 09/11/23   Alen Bleacher, NP  rivaroxaban (XARELTO) 20 MG TABS tablet TAKE 1 TABLET (20 MG TOTAL) BY MOUTH DAILY WITH SUPPER (BEDTIME) 10/25/23   Alen Bleacher, NP  spironolactone (ALDACTONE) 50 MG tablet TAKE 1 TABLET (50 MG TOTAL) BY MOUTH DAILY. (NOON) 10/24/23   Milford, Anderson Malta, FNP  tamsulosin (FLOMAX) 0.4 MG CAPS capsule Take 0.4 mg by mouth daily after supper.    [provider]  torsemide (DEMADEX) 20 MG tablet Take 4 tablets (80 mg total) by mouth 2 (two) times daily. 09/18/23   Jacklynn Ganong, FNP  TRUEplus Lancets 28G MISC Use as instructed. Check blood glucose level by fingerstick twice per day. 09/22/20   Claiborne Rigg, NP    Physical Exam: Vitals:   11/29/23 1408 11/29/23 1411 11/29/23 1847 11/29/23 2214   BP:  (!) 111/90 (!) 135/91 128/82  Pulse:  91 94 75  Resp:  16 (!) 22 15  Temp:  98 F (36.7 C) 98 F (36.7 C) 99.1 F (37.3 C)  TempSrc:  Oral Oral Oral  SpO2:  97% 96% 100%  Weight: (!) 158.8 kg     Height: 5\' 10"  (1.778 m)      Physical Exam Vitals reviewed.  Constitutional:      Appearance: He is obese.  HENT:     Head: Normocephalic.  Eyes:     Pupils: Pupils are equal, round, and reactive to light.  Cardiovascular:     Rate and Rhythm: Normal rate and regular rhythm.     Heart sounds: Normal heart sounds.  Pulmonary:     Effort: Pulmonary effort is normal.     Breath sounds: Normal breath sounds.  Abdominal:     General: Bowel sounds are normal.     Palpations: Abdomen is soft.  Musculoskeletal:        General: Normal range of motion.     Cervical back: Normal range of motion.  Skin:    General: Skin is warm.     Capillary Refill: Capillary refill takes less than 2 seconds.  Neurological:     General: No focal deficit present.       Labs on Admission: I have personally reviewed the patients's labs and imaging studies.  Assessment/Plan Principal Problem:   Heart failure (HCC)   # Acute on chronic heart failure with ejection fraction exacerbation, POA, active - Echocardiogram in September showed diastolic dysfunction - Patient presenting with elevated BNP orthopnea paroxysmal nocturnal dyspnea with worsening swelling in bilateral lower extremities - Taking 80 mg of torsemide  Plan: Aggressive IV diuresis with 80 mg IV Lasix every 6 hours for 3 doses Hold off repeat echo given recently had 1 in September Placed on CPAP Continue hydralazine, Imdur, losartan, spironolactone  # Paroxysmal A-fib-continue home Xarelto, continue diltiazem  # BPH-continue Flomax # GERD-continue Protonix # Hyperlipidemia-continue Lipitor # Hypokalemia-replete potassium    Admission status: Inpatient Telemetry Cardiac  Certification: The appropriate patient status for  this patient is INPATIENT. Inpatient status is judged to be reasonable and necessary in order to provide the required intensity of service to ensure the patient's safety. The patient's presenting symptoms, physical exam findings, and initial radiographic and laboratory data in the context of their chronic comorbidities is felt to place them at high risk for further clinical deterioration. Furthermore, it is not anticipated that the patient will be medically stable for discharge from the  hospital within 2 midnights of admission.   * I certify that at the point of admission it is my clinical judgment that the patient will require inpatient hospital care spanning beyond 2 midnights from the point of admission due to high intensity of service, high risk for further deterioration and high frequency of surveillance required.Alan Mulder MD [facility] If 7PM-7AM, please contact night-coverage [email or web address]  11/30/2023, 1:11 AM

## 2023-11-30 NOTE — Progress Notes (Signed)
 Progress Note   Patient: Benjamin [name] ZOX:[id number] DOB: 01-04-1966 DOA: 11/29/2023     0 DOS: the patient was seen and examined on 11/30/2023   Brief hospital course: 58yo with h/o afib/PE on Xarelto, DM, OSA on CPAP, HLD, polysubstance abuse, chronic HFpEF, and morbid obesity who presented on 3/12 with CP/SOB.  Diagnosed with acute on chronic diastolic CHF and given aggressive diuresis.    Assessment and Plan:  Acute on chronic diastolic CHF Patient with known h/o chronic diastolic CHF presenting with worsening SOB and hypoxia to 84% CXR with basilar atelectasis and RML focal nodular consolidation; current presentation appears more likely related to CHF than infectious process so hold abx Mildly elevated BNP compared with with known baseline 05/2023 echocardiogram with preserved EF and grade 1 DD without apparent valvular abnormality CHF order set utilized Was given Lasix 80 mg q6h x 3 in ER; will repeat with 40 mg IV BID starting tomorrow AM Continue [place] O2 for now Mildly elevated HS troponin is likely related to demand ischemia; doubt ACS based on symptoms Continue hydralazine, Imdur, losartan, spironolactone   Paroxysmal A-fib Rate controlled with diltiazem Continue home Xarelto   BPH Continue tamsulosin  GERD Continue Protonix  Hypokalemia Replete potassium  HTN Continue hydralazine, losartan  HLD Continue Lipitor  DM Lab Results  Component Value Date   HGBA1C 6.7 (H) 05/25/2023  Good control  Hold metformin, Jardiance Will cover with moderate-scale SSI for now  OSA/OHS/Morbid obesity Body mass index is 50.22 kg/m.Marland Kitchen  Weight loss should be encouraged Outpatient PCP/bariatric medicine/bariatric surgery f/u encouraged  This is likely a factor in his respiratory condition Continue CPAP at bedtime Continue Combivent, albuterol Continue home 2-3L [place] O2  Stage 3a CKD Stable  Avoid nephrotoxic agents Continue to  follow    Consultants: Cardiology  Procedures: Echocardiogram 3/13  Antibiotics: None  30 Day Unplanned Readmission Risk Score    Flowsheet Row ED to Hosp-Admission (Current) from 11/29/2023 in [facility] Emergency Department at [facility]  30 Day Unplanned Readmission Risk Score (%) 19.76 Filed at 11/30/2023 0400       This score is the patient's risk of an unplanned readmission within 30 days of being discharged (0 -100%). The score is based on dignosis, age, lab data, medications, orders, and past utilization.   Low:  0-14.9   Medium: 15-21.9   High: 22-29.9   Extreme: 30 and above          Subjective: Sitting up, disappointed that he doesn't have more breakfast, not wearing home O2 and not clearly SOB but reports that he is "not going home" until he is better.  Physical Exam: Vitals:   11/30/23 1215 11/30/23 1230 11/30/23 1322 11/30/23 1406  BP:   107/76 120/76  Pulse:   83 86  Resp: 18 20 20 18   Temp:    97.7 F (36.5 C)  TempSrc:    Oral  SpO2:   97% 98%  Weight:      Height:        Intake/Output Summary (Last 24 hours) at 11/30/2023 1437 Last data filed at 11/29/2023 2131 Gross per 24 hour  Intake --  Output 900 ml  Net -900 ml   Filed Weights   11/29/23 1408  Weight: (!) 158.8 kg    Exam:  General:  Appears calm and comfortable and is in NAD, on RA Eyes:  EOMI, normal lids, iris ENT:  grossly normal hearing, lips & tongue, mmm Neck:  no LAD, masses  or thyromegaly Cardiovascular:  RRR, no m/r/g. No LE edema.  Respiratory:   CTA bilaterally with no wheezes/rales/rhonchi.  Normal respiratory effort. Abdomen:  soft, NT, ND, NABS Back:   normal alignment, no CVAT Skin:  no rash or induration seen on limited exam; marked LE varicosities Musculoskeletal:  grossly normal tone BUE/BLE, good ROM, no bony abnormality Psychiatric:  grossly normal mood and affect, speech fluent and appropriate, AOx3 Neurologic:  CN 2-12 grossly intact, moves all  extremities in coordinated fashion  Data Reviewed: I have reviewed the patient's lab results since admission.  Pertinent labs for today include:   Glucose 130 BUN 19/Creatinine 1.46/GFR 55, stable Stable CBC BNP 112.9 HS troponin 31, 24    Family Communication: None present  Disposition: Status is: Inpatient Remains inpatient appropriate because: ongoing management  Planned Discharge Destination: Home    Time spent: 50 minutes  Author: Jonah Blue, MD 11/30/2023 2:37 PM  For on call review [email or web address].

## 2023-11-30 NOTE — Progress Notes (Signed)
Pt requested to be placed on CPAP around 0100. RT will return at that time.

## 2023-11-30 NOTE — Progress Notes (Signed)
 RT attempted to obtain arterial blood gas, patient asked to stop and have the needle removed from his arm as he could not tolerate it.

## 2023-12-01 DIAGNOSIS — I5033 Acute on chronic diastolic (congestive) heart failure: Secondary | ICD-10-CM | POA: Diagnosis not present

## 2023-12-01 LAB — RAPID URINE DRUG SCREEN, HOSP PERFORMED
Amphetamines: NOT DETECTED
Barbiturates: NOT DETECTED
Benzodiazepines: NOT DETECTED
Cocaine: POSITIVE — AB
Opiates: NOT DETECTED
Tetrahydrocannabinol: NOT DETECTED

## 2023-12-01 LAB — BASIC METABOLIC PANEL
Anion gap: 11 (ref 5–15)
BUN: 24 mg/dL — ABNORMAL HIGH (ref 6–20)
CO2: 27 mmol/L (ref 22–32)
Calcium: 8.8 mg/dL — ABNORMAL LOW (ref 8.9–10.3)
Chloride: 98 mmol/L (ref 98–111)
Creatinine, Ser: 1.49 mg/dL — ABNORMAL HIGH (ref 0.61–1.24)
GFR, Estimated: 54 mL/min — ABNORMAL LOW (ref >60)
Glucose, Bld: 92 mg/dL (ref 70–99)
Potassium: 3.7 mmol/L (ref 3.5–5.1)
Sodium: 136 mmol/L (ref 135–145)

## 2023-12-01 LAB — CBC WITH DIFFERENTIAL/PLATELET
Abs Immature Granulocytes: 0.02 10*3/uL (ref 0.00–0.07)
Basophils Absolute: 0 10*3/uL (ref 0.0–0.1)
Basophils Relative: 0 %
Eosinophils Absolute: 0.5 10*3/uL (ref 0.0–0.5)
Eosinophils Relative: 7 %
HCT: 47 % (ref 39.0–52.0)
Hemoglobin: 14.4 g/dL (ref 13.0–17.0)
Immature Granulocytes: 0 %
Lymphocytes Relative: 34 %
Lymphs Abs: 2.5 10*3/uL (ref 0.7–4.0)
MCH: 24.5 pg — ABNORMAL LOW (ref 26.0–34.0)
MCHC: 30.6 g/dL (ref 30.0–36.0)
MCV: 79.9 fL — ABNORMAL LOW (ref 80.0–100.0)
Monocytes Absolute: 0.7 10*3/uL (ref 0.1–1.0)
Monocytes Relative: 9 %
Neutro Abs: 3.6 10*3/uL (ref 1.7–7.7)
Neutrophils Relative %: 50 %
Platelets: 349 10*3/uL (ref 150–400)
RBC: 5.88 MIL/uL — ABNORMAL HIGH (ref 4.22–5.81)
RDW: 15.4 % (ref 11.5–15.5)
WBC: 7.3 10*3/uL (ref 4.0–10.5)
nRBC: 0 % (ref 0.0–0.2)

## 2023-12-01 MED ORDER — FUROSEMIDE 10 MG/ML IJ SOLN
80.0000 mg | Freq: Two times a day (BID) | INTRAMUSCULAR | Status: DC
  Administered 2023-12-01 – 2023-12-02 (×2): 80 mg via INTRAVENOUS
  Filled 2023-12-01 (×2): qty 8

## 2023-12-01 MED ORDER — POTASSIUM CHLORIDE CRYS ER 20 MEQ PO TBCR
40.0000 meq | EXTENDED_RELEASE_TABLET | Freq: Once | ORAL | Status: AC
  Administered 2023-12-01: 40 meq via ORAL
  Filled 2023-12-01: qty 2

## 2023-12-01 NOTE — Progress Notes (Signed)
 Mobility Specialist Progress Note:   12/01/23 1500  Mobility  Activity Ambulated with assistance in hallway  Level of Assistance Standby assist, set-up cues, supervision of patient - no hands on  Assistive Device None  Distance Ambulated (ft) 200 ft  Activity Response Tolerated well  Mobility Referral Yes  Mobility visit 1 Mobility  Mobility Specialist Start Time (ACUTE ONLY) 1500  Mobility Specialist Stop Time (ACUTE ONLY) 1515  Mobility Specialist Time Calculation (min) (ACUTE ONLY) 15 min   Pt agreeable to mobility session. Required no physical assistance, only supervision for safety. Pt c/o SOB, however SpO2 reading would not display on monitor. HR 90s with exertion. Pt back in bed with all needs met.   Addison Lank Mobility Specialist Please contact via SecureChat or Rehab office at [phone]

## 2023-12-01 NOTE — Hospital Course (Signed)
 58yo with h/o afib/PE on Xarelto, DM, OSA on CPAP, HLD, polysubstance abuse, chronic HFpEF, and morbid obesity who presented on 3/12 with CP/SOB. Diagnosed with acute on chronic diastolic CHF and given aggressive diuresis.

## 2023-12-01 NOTE — Progress Notes (Signed)
 Progress Note   Patient: Benjamin [name] NGE:[id number] DOB: 1966/01/20 DOA: 11/29/2023     1 DOS: the patient was seen and examined on 12/01/2023   Brief hospital course: 58yo with h/o afib/PE on Xarelto, DM, OSA on CPAP, HLD, polysubstance abuse, chronic HFpEF, and morbid obesity who presented on 3/12 with CP/SOB. Diagnosed with acute on chronic diastolic CHF and given aggressive diuresis.   Assessment and Plan:  Acute on chronic diastolic CHF Patient with known h/o chronic diastolic CHF presenting with worsening SOB and hypoxia to 84% CXR with basilar atelectasis and RML focal nodular consolidation; current presentation appears more likely related to CHF than infectious process so hold abx Mildly elevated BNP compared with with known baseline 05/2023 echocardiogram with preserved EF, severe LVH, and grade 1 DD without apparent valvular abnormality CHF order set utilized Was given Lasix 80 mg q6h x 3 in ER; will repeat with 40 mg IV BID starting 3/14 AM Continue [place] O2 for now Mildly elevated HS troponin is likely related to demand ischemia; doubt ACS based on symptoms Continue hydralazine, Imdur, losartan, spironolactone His habitus prevents accurate assessment of volume status and so much of decision making relies on patient report   Paroxysmal A-fib Rate controlled with diltiazem Continue home Xarelto   BPH Continue tamsulosin   GERD Continue Protonix   Hypokalemia Replete potassium   HTN Continue hydralazine, losartan   HLD Continue Lipitor   DM Last A1c 6.4, good control  Hold metformin, Jardiance Will cover with moderate-scale SSI for now   OSA/OHS/Morbid obesity Body mass index is 50.22 kg/m.Marland Kitchen  Weight loss should be encouraged Outpatient PCP/bariatric medicine/bariatric surgery f/u encouraged  This is likely a factor in his respiratory condition Continue CPAP at bedtime Continue Combivent, albuterol Continue home 2-3L [place] O2   Stage 3a CKD Stable   Avoid nephrotoxic agents Continue to follow  Polysubstance abuse UDS + cocaine Needs cessation        Consultants: Cardiology   Procedures: Echocardiogram 3/13   Antibiotics: None  30 Day Unplanned Readmission Risk Score    Flowsheet Row ED to Hosp-Admission (Current) from 11/29/2023 in [facility] 3E HF PCU  30 Day Unplanned Readmission Risk Score (%) 25.57 Filed at 12/01/2023 1200       This score is the patient's risk of an unplanned readmission within 30 days of being discharged (0 -100%). The score is based on dignosis, age, lab data, medications, orders, and past utilization.   Low:  0-14.9   Medium: 15-21.9   High: 22-29.9   Extreme: 30 and above           Subjective: Feels some better but still feels like there is fluid in his chest and he is not ready to go home yet.   Objective: Vitals:   12/01/23 0746 12/01/23 1209  BP: (!) 147/96 121/86  Pulse: 74 90  Resp: 20 20  Temp: 97.6 F (36.4 C) 98.6 F (37 C)  SpO2: 100% 97%    Intake/Output Summary (Last 24 hours) at 12/01/2023 1327 Last data filed at 12/01/2023 1307 Gross per 24 hour  Intake 480 ml  Output 2150 ml  Net -1670 ml   Filed Weights   11/29/23 1408 11/30/23 1406 12/01/23 0354  Weight: (!) 158.8 kg (!) 150.5 kg (!) 147.9 kg    Exam:  General:  Appears calm and comfortable and is in NAD, on RA Eyes:  EOMI, normal lids, iris ENT:  grossly normal hearing, lips & tongue, mmm Neck:  no LAD, masses or thyromegaly Cardiovascular:  RRR, no m/r/g. No LE edema.  Respiratory:   CTA bilaterally with no wheezes/rales/rhonchi.  Normal respiratory effort. Abdomen:  soft, NT, ND, NABS Back:   normal alignment, no CVAT Skin:  no rash or induration seen on limited exam; marked LE varicosities Musculoskeletal:  grossly normal tone BUE/BLE, good ROM, no bony abnormality Psychiatric:  grossly normal mood and affect, speech fluent and appropriate, AOx3 Neurologic:  CN 2-12 grossly intact,  moves all extremities in coordinated fashion  Data Reviewed: I have reviewed the patient's lab results since admission.  Pertinent labs for today include:   BUN 24/Creatinine 1.49/GFR 54 - stable Stable CBC UDS + cocaine     Family Communication: None present  Disposition: Status is: Inpatient Remains inpatient appropriate because: ongoing diuresis     Time spent: 50 minutes  Unresulted Labs (From admission, onward)     Start     Ordered   11/30/23 0045  Blood gas, arterial  Once,   R        11/30/23 0044             Author: Jonah Blue, MD 12/01/2023 1:27 PM  For on call review [email or web address].

## 2023-12-01 NOTE — Progress Notes (Signed)
 Rounding Note    Patient Name: Benjamin [name] Date of Encounter: 12/01/2023  [facility] Cardiologist: Benjamin Pates, MD   Subjective   He states he is not at baseline.  Inpatient Medications    Scheduled Meds:  atorvastatin  40 mg Oral QHS   diltiazem  360 mg Oral Daily   furosemide  80 mg Intravenous Q12H   hydrALAZINE  50 mg Oral Q8H   losartan  50 mg Oral Daily   pantoprazole  40 mg Oral Daily   rivaroxaban  20 mg Oral Daily   spironolactone  50 mg Oral Daily   tamsulosin  0.4 mg Oral QPC supper   Continuous Infusions:  PRN Meds: acetaminophen **OR** acetaminophen, ondansetron **OR** ondansetron (ZOFRAN) IV, polyvinyl alcohol   Vital Signs    Vitals:   11/30/23 1931 11/30/23 2329 12/01/23 0354 12/01/23 0746  BP: 100/67 108/84 115/82 (!) 147/96  Pulse: 84 88 87 74  Resp: 19 18 17 20   Temp: 98.1 F (36.7 C) 98.2 F (36.8 C) 98 F (36.7 C) 97.6 F (36.4 C)  TempSrc: Oral Oral Oral Oral  SpO2: 100% 97% 96% 100%  Weight:   (!) 147.9 kg   Height:        Intake/Output Summary (Last 24 hours) at 12/01/2023 0948 Last data filed at 12/01/2023 0849 Gross per 24 hour  Intake 120 ml  Output 950 ml  Net -830 ml      12/01/2023    3:54 AM 11/30/2023    2:06 PM 11/29/2023    2:08 PM  Last 3 Weights  Weight (lbs) 326 lb 1 oz 331 lb 12.7 oz 350 lb  Weight (kg) 147.9 kg 150.5 kg 158.759 kg      Telemetry    Sinus rhythm - Personally Reviewed  ECG    No new - Personally Reviewed  Physical Exam   Vitals:   12/01/23 0354 12/01/23 0746  BP: 115/82 (!) 147/96  Pulse: 87 74  Resp: 17 20  Temp: 98 F (36.7 C) 97.6 F (36.4 C)  SpO2: 96% 100%    GEN: No acute distress.  Morbidly obese Neck: difficult with habitus Cardiac: RRR, no murmurs, rubs, or gallops.  Respiratory: Clear to auscultation bilaterally. GI: distended MS: No edema; No deformity. Neuro:  Nonfocal  Psych: Normal affect   Labs    High Sensitivity Troponin:   Recent  Labs  Lab 11/29/23 1415 11/29/23 1615  TROPONINIHS 31* 24*     Chemistry Recent Labs  Lab 11/29/23 1415 11/30/23 0526  NA 135 136  K 3.4* 3.9  CL 98 102  CO2 29 30  GLUCOSE 84 130*  BUN 20 19  CREATININE 1.49* 1.46*  CALCIUM 9.3 8.7*  GFRNONAA 54* 55*  ANIONGAP 8 4*    Lipids No results for input(s): "CHOL", "TRIG", "HDL", "LABVLDL", "LDLCALC", "CHOLHDL" in the last 168 hours.  Hematology Recent Labs  Lab 11/29/23 1415 11/30/23 0526  WBC 5.8 6.1  RBC 6.04* 5.95*  HGB 14.8 14.8  HCT 47.7 47.4  MCV 79.0* 79.7*  MCH 24.5* 24.9*  MCHC 31.0 31.2  RDW 15.6* 15.5  PLT 407* 376   Thyroid No results for input(s): "TSH", "FREET4" in the last 168 hours.  BNP Recent Labs  Lab 11/29/23 1415  BNP 112.9*    DDimer No results for input(s): "DDIMER" in the last 168 hours.   Radiology    DG Chest 2 View Result Date: 11/29/2023 CLINICAL DATA:  Chest pain EXAM: CHEST -  2 VIEW COMPARISON:  Chest radiograph 05/25/2023 FINDINGS: Stable cardiomegaly. Basilar heterogeneous opacities. More focal nodular consolidation right mid lung. No pleural effusion or pneumothorax. Thoracic spine degenerative changes. IMPRESSION: 1. Basilar heterogeneous opacities may represent atelectasis or infection. 2. More focal nodular consolidation right mid lung. Recommend follow-up chest radiograph 4-6 weeks. Electronically Signed   By: Benjamin [name] M.D.   On: 11/29/2023 17:25    Cardiac Studies   05/22/2023 Normal LVEF,  mod LVH Grade I DD RV fxn nl, mild enlargement No valve dx  Patient Profile     Benjamin [name] is a 58 year old male with morbid obesity, history of PE, history of paroxysmal atrial fibrillation who presents here with obesity hypoventilation as well as c/f decompensated diastolic heart failure.  He has difficulty with etoh and cocaine use  Assessment & Plan    Diastolic CHF Obesity Hypoventilation -exam is challenging and suspect most symptoms related to obesity hypoventilation. He  was noted to be somnolent in the emergency room. They were unable to obtain a blood gas at that time. He reports continue difficulty breathing will increase lasix to 80 mg IV BID, as much as his renal fxn will tolerate. Last admission he was on 100 mg BID IV -continue spironolactone  PAF S/p TEE DCCV 08/2020 - in sinus rhythm - cont dilt 360 mg daily -cont xarelto 20 mg daily  HTN Well controlled mostly -cont current antihypertensives  OSA -CPAP  For questions or updates, please contact [facility] Please consult [email or web address] for contact info under        Signed, Benjamin Fus, MD  12/01/2023, 9:48 AM

## 2023-12-01 NOTE — Progress Notes (Signed)
 Notified Dr. Ophelia Charter of UDS results. No further orders received at this time.

## 2023-12-01 NOTE — Plan of Care (Signed)

## 2023-12-02 DIAGNOSIS — I5033 Acute on chronic diastolic (congestive) heart failure: Secondary | ICD-10-CM | POA: Diagnosis not present

## 2023-12-02 NOTE — Discharge Summary (Signed)
 Physician Discharge Summary   Patient: Benjamin [name] MRN: [id number] DOB: [birth year]  Admit date:     11/29/2023  Discharge date: 12/02/23  Discharge Physician: Jonah Blue   PCP: Claiborne Rigg, NP   Recommendations at discharge:   Do NOT use cocaine! Do not miss doses of scheduled medications Follow up with cardiology, call for appointment Follow up with NP Meredeth Ide in 1-2 weeks  Discharge Diagnoses: Principal Problem:   Acute on chronic diastolic CHF (congestive heart failure) (HCC) Active Problems:   Hypertension   PAF (paroxysmal atrial fibrillation) (HCC)   Type 2 diabetes mellitus with obesity (HCC)   Morbid obesity with BMI of 50.0-59.9, adult (HCC)   OSA on CPAP   HLD (hyperlipidemia)   Obesity hypoventilation syndrome [facility])    Hospital Course: 58yo with h/o afib/PE on Xarelto, DM, OSA on CPAP, HLD, polysubstance abuse, chronic HFpEF, and morbid obesity who presented on 3/12 with CP/SOB. Diagnosed with acute on chronic diastolic CHF and given aggressive diuresis.   Assessment and Plan:  Acute on chronic diastolic CHF Patient with known h/o chronic diastolic CHF presenting with worsening SOB and hypoxia to 84% CXR with basilar atelectasis and RML focal nodular consolidation; current presentation appears more likely related to CHF than infectious process so hold abx Mildly elevated BNP compared with with known baseline 05/2023 echocardiogram with preserved EF, severe LVH, and grade 1 DD without apparent valvular abnormality CHF order set utilized Was given Lasix 80 mg q6h x 3 in ER; will repeat with 40 mg IV BID starting 3/14 AM Continue [place] O2 for now Mildly elevated HS troponin is likely related to demand ischemia; doubt ACS based on symptoms Continue hydralazine, Imdur, losartan, spironolactone His habitus prevents accurate assessment of volume status and so much of decision making relies on patient report He feels much better today and wants to go  home Resume home torsemide   Paroxysmal A-fib Rate controlled with diltiazem Continue home Xarelto   BPH Continue tamsulosin   GERD Continue Protonix   Hypokalemia Replete potassium   HTN Continue hydralazine, losartan   HLD Continue Lipitor   DM Last A1c 6.4, good control  Resume metformin, Jardiance  OSA/OHS/Morbid obesity Body mass index is 50.22 kg/m.Marland Kitchen  Weight loss should be encouraged Outpatient PCP/bariatric medicine/bariatric surgery f/u encouraged  This is likely a factor in his respiratory condition Continue CPAP at bedtime Continue Combivent, albuterol Continue home 2-3L [place] O2   Stage 3a CKD Stable  Avoid nephrotoxic agents Continue to follow   Polysubstance abuse UDS + cocaine Needs cessation, discussed with patient         Consultants: Cardiology   Procedures: Echocardiogram 3/13   Antibiotics: None    Pain control - [facility] Controlled Substance Reporting System database was reviewed. and patient was instructed, not to drive, operate heavy machinery, perform activities at heights, swimming or participation in water activities or provide baby-sitting services while on Pain, Sleep and Anxiety Medications; until their outpatient Physician has advised to do so again. Also recommended to not to take more than prescribed Pain, Sleep and Anxiety Medications.    Disposition: Home Diet recommendation:  Cardiac and Carb modified diet DISCHARGE MEDICATION: Allergies as of 12/02/2023   No Known Allergies      Medication List     TAKE these medications    albuterol (2.5 MG/3ML) 0.083% nebulizer solution Commonly known as: PROVENTIL Take 3 mLs (2.5 mg total) by nebulization every 4 (four) hours as needed for wheezing or shortness  of breath. INHALE 1 VIAL BY NEBULIZATION EVERY 6 (SIX) HOURS AS NEEDED FOR WHEEZING OR SHORTNESS OF BREATH. Strength: (2.5 MG/3ML) 0.083%   atorvastatin 40 MG tablet Commonly known as: LIPITOR TAKE 1  TABLET (40 MG TOTAL) BY MOUTH AT BEDTIME.   Combivent Respimat 20-100 MCG/ACT Aers respimat Generic drug: Ipratropium-Albuterol INHALE 1 PUFF INTO THE LUNGS EVERY 6 (SIX) HOURS.   diltiazem 360 MG 24 hr capsule Commonly known as: CARDIZEM CD TAKE 1 CAPSULE (360 MG TOTAL) BY MOUTH DAILY. (NOON)   empagliflozin 10 MG Tabs tablet Commonly known as: Jardiance Take 1 tablet (10 mg total) by mouth daily. NEEDS FOLLOW UP APPOINTMENT FOR MORE REFILLS   FeroSul 325 (65 Fe) MG tablet Generic drug: ferrous sulfate TAKE 1 TABLET (325 MG TOTAL) BY MOUTH DAILY AT NOON.   hydrALAZINE 50 MG tablet Commonly known as: APRESOLINE TAKE 1 TABLET BY MOUTH 3 (THREE) TIMES DAILY. (NOON,EVENING,BEDTIME)   isosorbide mononitrate 30 MG 24 hr tablet Commonly known as: IMDUR Take 1 tablet (30 mg total) by mouth daily.   losartan 50 MG tablet Commonly known as: COZAAR TAKE 1 TABLET (50 MG TOTAL) BY MOUTH DAILY. (NOON)   magnesium oxide 400 (240 Mg) MG tablet Commonly known as: MAG-OX TAKE 1 TABLET (400 MG TOTAL) BY MOUTH 2 (TWO) TIMES DAILY (NOON+BEDTIME)   metFORMIN 500 MG tablet Commonly known as: GLUCOPHAGE TAKE 1 TABLET BY MOUTH 2 (TWO) TIMES DAILY WITH A MEAL(NOON+BEDTIME)   omeprazole 20 MG capsule Commonly known as: PRILOSEC TAKE 1 CAPSULE 2 (TWO) TIMES DAILY BEFORE A MEAL. TAKE FIRST CAPSULE 30 MIN PRIOR TO EATING OR TAKING OTHER MEDICATIONS. (AM+BEDTIME)   OXYGEN Inhale 2-3 L into the lungs continuous.   potassium chloride SA 20 MEQ tablet Commonly known as: KLOR-CON M Take 5 tablets (100 mEq total) by mouth every morning AND 4 tablets (80 mEq total) every evening.   spironolactone 50 MG tablet Commonly known as: ALDACTONE TAKE 1 TABLET (50 MG TOTAL) BY MOUTH DAILY. (NOON)   tamsulosin 0.4 MG Caps capsule Commonly known as: FLOMAX Take 0.4 mg by mouth at bedtime.   torsemide 20 MG tablet Commonly known as: DEMADEX Take 4 tablets (80 mg total) by mouth 2 (two) times daily.    True Metrix Blood Glucose Test test strip Generic drug: glucose blood Use as instructed. Check blood glucose level by fingerstick twice per day.   TRUEplus Lancets 28G Misc Use as instructed. Check blood glucose level by fingerstick twice per day.   Xarelto 20 MG Tabs tablet Generic drug: rivaroxaban TAKE 1 TABLET (20 MG TOTAL) BY MOUTH DAILY WITH SUPPER (BEDTIME)        Discharge Exam:   Subjective: Feels much better today and wants to go home   Objective: Vitals:   12/02/23 0416 12/02/23 0848  BP: (!) 125/96 (!) 152/66  Pulse: 88   Resp: 20 20  Temp: 97.9 F (36.6 C) 97.8 F (36.6 C)  SpO2: 96%     Intake/Output Summary (Last 24 hours) at 12/02/2023 1057 Last data filed at 12/01/2023 1853 Gross per 24 hour  Intake 480 ml  Output 1900 ml  Net -1420 ml   Filed Weights   11/30/23 1406 12/01/23 0354 12/02/23 0451  Weight: (!) 150.5 kg (!) 147.9 kg (!) 150.7 kg    Exam:  General:  Appears calm and comfortable and is in NAD, on RA Eyes:  EOMI, normal lids, iris ENT:  grossly normal hearing, lips & tongue, mmm Neck:  no LAD, masses  or thyromegaly Cardiovascular:  RRR, no m/r/g. No LE edema.  Respiratory:   CTA bilaterally with no wheezes/rales/rhonchi.  Normal respiratory effort. Abdomen:  soft, NT, ND, NABS Back:   normal alignment, no CVAT Skin:  no rash or induration seen on limited exam; marked LE varicosities Musculoskeletal:  grossly normal tone BUE/BLE, good ROM, no bony abnormality Psychiatric:  grossly normal mood and affect, speech fluent and appropriate, AOx3 Neurologic:  CN 2-12 grossly intact, moves all extremities in coordinated fashion  Data Reviewed: I have reviewed the patient's lab results since admission.  Pertinent labs for today include:   None today    Condition at discharge: stable  The results of significant diagnostics from this hospitalization (including imaging, microbiology, ancillary and laboratory) are listed below for  reference.   Imaging Studies: DG Chest 2 View Result Date: 11/29/2023 CLINICAL DATA:  Chest pain EXAM: CHEST - 2 VIEW COMPARISON:  Chest radiograph 05/25/2023 FINDINGS: Stable cardiomegaly. Basilar heterogeneous opacities. More focal nodular consolidation right mid lung. No pleural effusion or pneumothorax. Thoracic spine degenerative changes. IMPRESSION: 1. Basilar heterogeneous opacities may represent atelectasis or infection. 2. More focal nodular consolidation right mid lung. Recommend follow-up chest radiograph 4-6 weeks. Electronically Signed   By: Annia Belt M.D.   On: 11/29/2023 17:25    Microbiology: Results for orders placed or performed during the hospital encounter of 05/25/23  SARS Coronavirus 2 by RT PCR (hospital order, performed in [facility] hospital lab) *cepheid single result test* Anterior Nasal Swab     Status: None   Collection Time: 05/25/23  1:30 PM   Specimen: Anterior Nasal Swab  Result Value Ref Range Status   SARS Coronavirus 2 by RT PCR NEGATIVE NEGATIVE Final    Comment: Performed at [facility], [address] [place], [place] [address]   *Note: Due to a large number of results and/or encounters for the requested time period, some results have not been displayed. A complete set of results can be found in Results Review.    Labs: CBC: Recent Labs  Lab 11/29/23 1415 11/30/23 0526 12/01/23 0915  WBC 5.8 6.1 7.3  NEUTROABS  --   --  3.6  HGB 14.8 14.8 14.4  HCT 47.7 47.4 47.0  MCV 79.0* 79.7* 79.9*  PLT 407* 376 349   Basic Metabolic Panel: Recent Labs  Lab 11/29/23 1415 11/30/23 0526 12/01/23 0915  NA 135 136 136  K 3.4* 3.9 3.7  CL 98 102 98  CO2 29 30 27   GLUCOSE 84 130* 92  BUN 20 19 24*  CREATININE 1.49* 1.46* 1.49*  CALCIUM 9.3 8.7* 8.8*   Liver Function Tests: No results for input(s): "AST", "ALT", "ALKPHOS", "BILITOT", "PROT", "ALBUMIN" in the last 168 hours. CBG: No results for input(s): "GLUCAP" in the last 168  hours.  Discharge time spent: greater than 30 minutes.  Signed: Jonah Blue, MD [facility] 12/02/2023

## 2023-12-02 NOTE — Progress Notes (Signed)
 Rounding Note    Patient Name: Benjamin [name] Date of Encounter: 12/02/2023  [facility] Cardiologist: Dietrich Pates, MD   Subjective   He states that he feels that he is back to his baseline.  He is able to sleep comfortably.  Inpatient Medications    Scheduled Meds:  atorvastatin  40 mg Oral QHS   diltiazem  360 mg Oral Daily   furosemide  80 mg Intravenous Q12H   hydrALAZINE  50 mg Oral Q8H   losartan  50 mg Oral Daily   pantoprazole  40 mg Oral Daily   rivaroxaban  20 mg Oral Daily   spironolactone  50 mg Oral Daily   tamsulosin  0.4 mg Oral QPC supper   Continuous Infusions:  PRN Meds: acetaminophen **OR** acetaminophen, ondansetron **OR** ondansetron (ZOFRAN) IV, polyvinyl alcohol   Vital Signs    Vitals:   12/02/23 0049 12/02/23 0416 12/02/23 0451 12/02/23 0848  BP: 127/72 (!) 125/96  (!) 152/66  Pulse: 93 88    Resp: 20 20  20   Temp: 98.7 F (37.1 C) 97.9 F (36.6 C)  97.8 F (36.6 C)  TempSrc: Oral Oral  Oral  SpO2: 92% 96%    Weight:   (!) 150.7 kg   Height:        Intake/Output Summary (Last 24 hours) at 12/02/2023 1103 Last data filed at 12/01/2023 1853 Gross per 24 hour  Intake 480 ml  Output 1900 ml  Net -1420 ml      12/02/2023    4:51 AM 12/01/2023    3:54 AM 11/30/2023    2:06 PM  Last 3 Weights  Weight (lbs) 332 lb 3.7 oz 326 lb 1 oz 331 lb 12.7 oz  Weight (kg) 150.7 kg 147.9 kg 150.5 kg      Telemetry    Sinus rhythm - Personally Reviewed  ECG    No new - Personally Reviewed  Physical Exam   Vitals:   12/02/23 0416 12/02/23 0848  BP: (!) 125/96 (!) 152/66  Pulse: 88   Resp: 20 20  Temp: 97.9 F (36.6 C) 97.8 F (36.6 C)  SpO2: 96%     GEN: No acute distress.  Morbidly obese Neck: difficult with habitus Cardiac: RRR, no murmurs, rubs, or gallops.  Respiratory: Clear to auscultation bilaterally. GI: distended MS: No edema; No deformity. Neuro:  Nonfocal  Psych: Normal affect   Labs    High  Sensitivity Troponin:   Recent Labs  Lab 11/29/23 1415 11/29/23 1615  TROPONINIHS 31* 24*     Chemistry Recent Labs  Lab 11/29/23 1415 11/30/23 0526 12/01/23 0915  NA 135 136 136  K 3.4* 3.9 3.7  CL 98 102 98  CO2 29 30 27   GLUCOSE 84 130* 92  BUN 20 19 24*  CREATININE 1.49* 1.46* 1.49*  CALCIUM 9.3 8.7* 8.8*  GFRNONAA 54* 55* 54*  ANIONGAP 8 4* 11    Lipids No results for input(s): "CHOL", "TRIG", "HDL", "LABVLDL", "LDLCALC", "CHOLHDL" in the last 168 hours.  Hematology Recent Labs  Lab 11/29/23 1415 11/30/23 0526 12/01/23 0915  WBC 5.8 6.1 7.3  RBC 6.04* 5.95* 5.88*  HGB 14.8 14.8 14.4  HCT 47.7 47.4 47.0  MCV 79.0* 79.7* 79.9*  MCH 24.5* 24.9* 24.5*  MCHC 31.0 31.2 30.6  RDW 15.6* 15.5 15.4  PLT 407* 376 349   Thyroid No results for input(s): "TSH", "FREET4" in the last 168 hours.  BNP Recent Labs  Lab 11/29/23 1415  BNP 112.9*    DDimer No results for input(s): "DDIMER" in the last 168 hours.   Radiology    No results found.   Cardiac Studies   05/22/2023 Normal LVEF,  mod LVH Grade I DD RV fxn nl, mild enlargement No valve dx  Patient Profile     Benjamin [name] is a 58 year old male with morbid obesity, history of PE, history of paroxysmal atrial fibrillation who presents here with obesity hypoventilation as well as c/f decompensated diastolic heart failure.  He has difficulty with etoh and cocaine use  Assessment & Plan    Diastolic CHF Obesity Hypoventilation -exam is challenging and suspect most symptoms related to obesity hypoventilation. He was noted to be somnolent in the emergency room. They were unable to obtain a blood gas at that time. He states he feels much better and is able to lie flat comfortably.  Can transition him to his home torsemide.  Renal function improved on admission and stable at 1.4/GFR 54 -Can discuss Jardiance as an outpatient, he is being discharged on the weekend and will be difficult to review cost -continue  spironolactone  PAF S/p TEE DCCV 08/2020 - in sinus rhythm - cont dilt 360 mg daily -cont xarelto 20 mg daily  HTN Well controlled mostly -cont current antihypertensives  OSA -CPAP  He can be discharged from a cardiology perspective.  For questions or updates, please contact [facility] Please consult [email or web address] for contact info under        Signed, Maisie Fus, MD  12/02/2023, 11:03 AM

## 2023-12-02 NOTE — Progress Notes (Signed)
 Mobility Specialist Progress Note:   12/02/23 1045  Mobility  Activity Ambulated with assistance in hallway  Level of Assistance Standby assist, set-up cues, supervision of patient - no hands on  Assistive Device None  Distance Ambulated (ft) 250 ft  Activity Response Tolerated well  Mobility Referral Yes  Mobility visit 1 Mobility  Mobility Specialist Start Time (ACUTE ONLY) 1045  Mobility Specialist Stop Time (ACUTE ONLY) 1100  Mobility Specialist Time Calculation (min) (ACUTE ONLY) 15 min     12/02/23 1045  Mobility  Activity Ambulated with assistance in hallway  Level of Assistance Standby assist, set-up cues, supervision of patient - no hands on  Assistive Device None  Distance Ambulated (ft) 250 ft  Activity Response Tolerated well  Mobility Referral Yes  Mobility visit 1 Mobility  Mobility Specialist Start Time (ACUTE ONLY) 1045  Mobility Specialist Stop Time (ACUTE ONLY) 1100  Mobility Specialist Time Calculation (min) (ACUTE ONLY) 15 min   Pt agreeable to mobility session. Required no physical assistance throughout. Pt c/o SOB as distance increased, however VSS on RA. Encouraged frequent exercise as home. Pt back in bed with all needs met, eager for d/c.  Addison Lank Mobility Specialist Please contact via SecureChat or  Rehab office at [phone]

## 2023-12-04 ENCOUNTER — Telehealth: Payer: Self-pay

## 2023-12-04 ENCOUNTER — Telehealth: Payer: Self-pay | Admitting: Cardiology

## 2023-12-04 NOTE — Telephone Encounter (Signed)
 Lvm to schedule referral appt

## 2023-12-04 NOTE — Transitions of Care (Post Inpatient/ED Visit) (Signed)
 12/04/2023  Name: Benjamin [name] MRN: [id number] DOB: 1966/08/26  Today's TOC FU Call Status: Today's TOC FU Call Status:: Successful TOC FU Call Completed TOC FU Call Complete Date: 12/04/23 Patient's Name and Date of Birth confirmed.  Transition Care Management Follow-up Telephone Call Date of Discharge: 12/04/23 Discharge Facility: [facility]) Type of Discharge: Inpatient Admission Primary Inpatient Discharge Diagnosis:: Acute on chronic heart failure How have you been since you were released from the hospital?: Better Any questions or concerns?: No  Items Reviewed: Did you receive and understand the discharge instructions provided?: Yes Medications obtained,verified, and reconciled?: Yes (Medications Reviewed) Any new allergies since your discharge?: No Dietary orders reviewed?: Yes Type of Diet Ordered:: Low sodium, Heart healthy, carbohydrate modified diet Do you have support at home?: Yes People in Home: friend(s) Name of Support/Comfort Primary Source: states has friends he can call if needed  Medications Reviewed Today: Medications Reviewed Today     Reviewed by Marcos Eke, RN (Registered Nurse) on 12/04/23 at 1400  Med List Status: <None>   Medication Order Taking? Sig Documenting Provider Last Dose Status Informant  albuterol (PROVENTIL) (2.5 MG/3ML) 0.083% nebulizer solution [id number] Yes Take 3 mLs (2.5 mg total) by nebulization every 4 (four) hours as needed for wheezing or shortness of breath. INHALE 1 VIAL BY NEBULIZATION EVERY 6 (SIX) HOURS AS NEEDED FOR WHEEZING OR SHORTNESS OF BREATH. Strength: (2.5 MG/3ML) 0.083% Claiborne Rigg, NP Taking Active Self, Pharmacy Records  atorvastatin (LIPITOR) 40 MG tablet [id number] Yes TAKE 1 TABLET (40 MG TOTAL) BY MOUTH AT BEDTIME. [place], Anderson Malta, FNP Taking Active Self, Pharmacy Records  COMBIVENT RESPIMAT 20-100 MCG/ACT AERS respimat [id number]  INHALE 1 PUFF INTO THE LUNGS EVERY 6 (SIX) HOURS.  Patient  taking differently: Inhale 1 puff into the lungs every 6 (six) hours as needed for shortness of breath.   Coralyn Helling, MD  Active Self, Pharmacy Records           Med Note (CRUTHIS, CHLOE C   Thu Nov 30, 2023 12:29 PM) Pt was unable to verify if he has this medication at home or if he has been taking it.   diltiazem (CARDIZEM CD) 360 MG 24 hr capsule [id number] Yes TAKE 1 CAPSULE (360 MG TOTAL) BY MOUTH DAILY. (NOON) [place], Anderson Malta, FNP Taking Active Self, Pharmacy Records  empagliflozin (JARDIANCE) 10 MG TABS tablet [id number] Yes Take 1 tablet (10 mg total) by mouth daily. NEEDS FOLLOW UP APPOINTMENT FOR MORE REFILLS [place], Anderson Malta, FNP Taking Active Self, Pharmacy Records  FEROSUL 325 (65 Fe) MG tablet [id number] Yes TAKE 1 TABLET (325 MG TOTAL) BY MOUTH DAILY AT NOON. Claiborne Rigg, NP Taking Active Self, Pharmacy Records  glucose blood (TRUE METRIX BLOOD GLUCOSE TEST) test strip [id number] Yes Use as instructed. Check blood glucose level by fingerstick twice per day. Claiborne Rigg, NP Taking Active Self, Pharmacy Records  hydrALAZINE (APRESOLINE) 50 MG tablet [id number] Yes TAKE 1 TABLET BY MOUTH 3 (THREE) TIMES DAILY. (NOON,EVENING,BEDTIME) [place], Anderson Malta, FNP Taking Active Self, Pharmacy Records  isosorbide mononitrate (IMDUR) 30 MG 24 hr tablet [id number]  Take 1 tablet (30 mg total) by mouth daily. Arrien, York Ram, MD  Active Self, Pharmacy Records           Med Note (CRUTHIS, CHLOE C   Thu Nov 30, 2023 12:29 PM) Pt was unable to verify if he has this medication at home or if he has been taking it.   losartan (  COZAAR) 50 MG tablet [id number] Yes TAKE 1 TABLET (50 MG TOTAL) BY MOUTH DAILY. (NOON) [place], Anderson Malta, FNP Taking Active Self, Pharmacy Records  magnesium oxide (MAG-OX) 400 (240 Mg) MG tablet [id number] Yes TAKE 1 TABLET (400 MG TOTAL) BY MOUTH 2 (TWO) TIMES DAILY (NOON+BEDTIME) [place], Anderson Malta, FNP Taking Active Self, Pharmacy Records  metFORMIN (GLUCOPHAGE)  500 MG tablet [id number] Yes TAKE 1 TABLET BY MOUTH 2 (TWO) TIMES DAILY WITH A MEAL(NOON+BEDTIME) Claiborne Rigg, NP Taking Active Self, Pharmacy Records  omeprazole (PRILOSEC) 20 MG capsule [id number] Yes TAKE 1 CAPSULE 2 (TWO) TIMES DAILY BEFORE A MEAL. TAKE FIRST CAPSULE 30 MIN PRIOR TO EATING OR TAKING OTHER MEDICATIONS. (AM+BEDTIME) Claiborne Rigg, NP Taking Active Self, Pharmacy Records  OXYGEN [id number] Yes Inhale 2-3 L into the lungs continuous. [provider] Taking Active Self, Pharmacy Records           Med Note (CRUTHIS, CHLOE C   Thu Nov 30, 2023 12:28 PM) Continuous   potassium chloride SA (KLOR-CON M) 20 MEQ tablet [id number]  Take 5 tablets (100 mEq total) by mouth every morning AND 4 tablets (80 mEq total) every evening. Alen Bleacher, NP  Active Self, Pharmacy Records           Med Note (CRUTHIS, Madelon Lips Nov 30, 2023 12:29 PM) Pt was unable to verify if he has this medication at home or if he has been taking it.   rivaroxaban (XARELTO) 20 MG TABS tablet [id number] Yes TAKE 1 TABLET (20 MG TOTAL) BY MOUTH DAILY WITH SUPPER (BEDTIME) Alen Bleacher, NP Taking Active Self, Pharmacy Records  spironolactone (ALDACTONE) 50 MG tablet [id number] Yes TAKE 1 TABLET (50 MG TOTAL) BY MOUTH DAILY. (NOON) [place], Anderson Malta, FNP Taking Active Self, Pharmacy Records  tamsulosin (FLOMAX) 0.4 MG CAPS capsule [id number] Yes Take 0.4 mg by mouth at bedtime. [provider] Taking Active Self, Pharmacy Records  torsemide (DEMADEX) 20 MG tablet [id number] Yes Take 4 tablets (80 mg total) by mouth 2 (two) times daily. [place], Anderson Malta, FNP Taking Active Self, Pharmacy Records  TRUEplus Lancets 28G [place] [id number] Yes Use as instructed. Check blood glucose level by fingerstick twice per day. Claiborne Rigg, NP Taking Active Self, Pharmacy Records  Med List Note Salvatore Marvel, CPhT 08/19/20 2010): Kerry Hough, EMT-P @ [phone]            Home Care and  Equipment/Supplies: Were Home Health Services Ordered?: No Any new equipment or medical supplies ordered?: No (Continues on O2 at 2-3L via nasal cannula continuously and CPAP at night)  Functional Questionnaire: Do you need assistance with bathing/showering or dressing?: No Do you need assistance with meal preparation?: No Do you need assistance with eating?: No Do you have difficulty maintaining continence: No Do you need assistance with getting out of bed/getting out of a chair/moving?: No Do you have difficulty managing or taking your medications?: No  Follow up appointments reviewed: PCP Follow-up appointment confirmed?: No (Patient states he plans to call PCP [name] himself and has her office phone number, stated preferred making appt himself) MD Provider Line Number:[phone] Given: No Specialist Hospital Follow-up appointment confirmed?: Yes Date of Specialist follow-up appointment?: 12/12/23 Follow-Up Specialty Provider:: 3/25 FU with [facility], 3/26 DM foot care w/ Helane Gunther, 4/1 Cardiologist FU Dr. Delma Officer Do you need transportation to your follow-up appointment?: No Do you understand care options if your condition(s) worsen?: Yes-patient verbalized understanding  Jrue Jarriel A. Mliss Fritz RN, BA, [facility], CRRN [facility] RN Care Manager, Transition of Care [phone]

## 2023-12-07 ENCOUNTER — Telehealth (HOSPITAL_COMMUNITY): Payer: Self-pay | Admitting: Licensed Clinical Social Worker

## 2023-12-07 NOTE — Telephone Encounter (Signed)
 H&V Care Navigation CSW Progress Note  Clinical Social Worker received call from pt [place] office that pt has been taken to jail for new criminal charges.  CSW contacted [facility] and spoke to medical staff- they faxed CSW ROI and clinic staff printed appropriate medical documents for CSW to fax back to them for review and to assist pt in remaining compliant with medications while detained.    SDOH Screenings   Food Insecurity: No Food Insecurity (12/04/2023)  Housing: Low Risk  (12/04/2023)  Transportation Needs: No Transportation Needs (12/04/2023)  Recent Concern: Transportation Needs - Unmet Transportation Needs (11/13/2023)  Utilities: Not At Risk (12/04/2023)  Depression (PHQ2-9): Low Risk  (08/11/2023)  Financial Resource Strain: High Risk (02/27/2023)  Social Connections: Unknown (03/12/2023)   Received from [facility]  Tobacco Use: Medium Risk (12/04/2023)   [name], LCSW Clinical Social Worker [facility] Desk#: [phone] Cell#: [phone]

## 2023-12-11 ENCOUNTER — Telehealth (HOSPITAL_COMMUNITY): Payer: Self-pay | Admitting: Licensed Clinical Social Worker

## 2023-12-11 ENCOUNTER — Telehealth (HOSPITAL_COMMUNITY): Payer: Self-pay

## 2023-12-11 NOTE — Telephone Encounter (Signed)
 Called to confirm/remind patient of their appointment at the [facility] on 12/12/2023 12pm.   Appointment:   [x] Confirmed    Patient reminded to bring all medications and/or complete list.  Confirmed patient has transportation. Gave directions, instructed to utilize valet parking.

## 2023-12-11 NOTE — Telephone Encounter (Signed)
 H&V Care Navigation CSW Progress Note  Clinical Social Worker called pt to check in and discuss appt for tomorrow.  Pt does not have transportation- will arrange [employer] taxi to bring to appt.   SDOH Screenings   Food Insecurity: No Food Insecurity (12/04/2023)  Housing: Low Risk  (12/04/2023)  Transportation Needs: Unmet Transportation Needs (12/11/2023)  Utilities: Not At Risk (12/04/2023)  Depression (PHQ2-9): Low Risk  (08/11/2023)  Financial Resource Strain: High Risk (02/27/2023)  Social Connections: Unknown (03/12/2023)   Received from [facility], [facility]  Tobacco Use: Medium Risk (12/04/2023)    [name], LCSW Clinical Social Worker [facility] Desk#: [phone] Cell#: [phone]

## 2023-12-12 ENCOUNTER — Encounter (HOSPITAL_COMMUNITY): Payer: MEDICAID

## 2023-12-13 ENCOUNTER — Ambulatory Visit: Payer: MEDICAID | Admitting: Podiatry

## 2023-12-14 ENCOUNTER — Telehealth (HOSPITAL_COMMUNITY): Payer: Self-pay | Admitting: Licensed Clinical Social Worker

## 2023-12-14 NOTE — Telephone Encounter (Signed)
 CSW attempted to reach patient regarding no show to appt Tuesday despite CSW arranging transportation- unable to reach- left VM requesting return call  [name], LCSW Clinical Social Worker [facility] Desk#: [phone] Cell#: [phone]

## 2023-12-15 ENCOUNTER — Telehealth: Payer: Self-pay | Admitting: Cardiology

## 2023-12-15 NOTE — Telephone Encounter (Incomplete)
 Called to confirm/remind patient of their appointment at the [facility] on 12/18/23***.   Appointment:   [] Confirmed  [x] Left mess   [] No answer/No voice mail  [] Phone not in service  Patient reminded to bring all medications and/or complete list.  Confirmed patient has transportation. Gave directions, instructed to utilize valet parking.

## 2023-12-18 ENCOUNTER — Encounter: Payer: MEDICAID | Admitting: Cardiology

## 2023-12-18 NOTE — Telephone Encounter (Signed)
 Pt returned call on Friday- reports he wasn't able to make it to appt Tuesday as he went out of town.  Patient will plan to call and reschedule with clinic staff next week.  [name], LCSW Clinical Social Worker [facility] Desk#: [phone] Cell#: [phone]

## 2023-12-19 ENCOUNTER — Other Ambulatory Visit (HOSPITAL_COMMUNITY): Payer: Self-pay | Admitting: Family Medicine

## 2023-12-19 ENCOUNTER — Ambulatory Visit: Payer: MEDICAID | Attending: Pharmacist | Admitting: Pharmacist

## 2024-05-14 ENCOUNTER — Telehealth: Payer: Self-pay

## 2024-05-14 NOTE — Telephone Encounter (Signed)
 Received CMN from [facility] via fax. Placed CMN on Benjamin [name]'s desk in B Pod for signature. Will fax back to [facility] once signed.

## 2024-05-24 NOTE — Telephone Encounter (Signed)
 Please schedule pt appointment with KC

## 2024-05-24 NOTE — Telephone Encounter (Signed)
 Received cmn from [facility] under Dr elin name. KC has signed this, this has been faxed to [phone] and confirmation received

## 2024-05-24 NOTE — Telephone Encounter (Signed)
 Attempted to call patient. Left voicemail for patient to call office to get scheduled.

## 2024-05-27 ENCOUNTER — Encounter: Payer: Self-pay | Admitting: Nurse Practitioner

## 2024-05-27 NOTE — Telephone Encounter (Signed)
 Attempted to call patient x2, no one answered so I left a voicemail. Will send MyChart msg aswell.

## 2024-05-28 NOTE — Telephone Encounter (Signed)
 Attempted to call patient x3, answered unable to leave VM

## 2024-08-06 ENCOUNTER — Telehealth: Payer: Self-pay | Admitting: Nurse Practitioner

## 2024-08-06 NOTE — Telephone Encounter (Signed)
 Copied from CRM [id number]. Topic: General - Other >> Aug 06, 2024 11:23 AM Delon DASEN wrote:  Reason for CRM: Nathanel with [facility] sending request for office notes

## 2024-08-06 NOTE — Telephone Encounter (Signed)
 Noted

## 2024-08-29 ENCOUNTER — Other Ambulatory Visit: Payer: Self-pay

## 2024-08-29 ENCOUNTER — Telehealth: Payer: Self-pay

## 2024-08-29 NOTE — Telephone Encounter (Signed)
 Benjamin [name] has received forms needing her signature, but an office visit is required before forms can be signed and faxed.  Unable to reach patient by phone. Voicemail left to return call to our office.

## 2024-09-10 ENCOUNTER — Telehealth: Payer: Self-pay | Admitting: Nurse Practitioner

## 2024-09-10 NOTE — Telephone Encounter (Signed)
 Copied from CRM [id number]. Topic: General - Other >> Sep 10, 2024 10:48 AM Antony RAMAN wrote:  Reason for CRM: kathy from [employer] [phone] ext [phone]) oxygen  provider is calling needing chart notes showing why the patient needs oxygen  from the most recent visit

## 2024-09-10 NOTE — Telephone Encounter (Signed)
 I spoke with Benjamin [name] and advised her that the patient has not been seen in office recently. There are no notes to provide for order and that the patient needs an office appointment. [name] voiced understanding.
